# Patient Record
Sex: Female | Born: 1965 | Race: White | Hispanic: No | Marital: Married | Smoking: Current every day smoker
Health system: Southern US, Community
[De-identification: ages and names within clinical notes are randomized; demographics above are authoritative.]

## PROBLEM LIST (undated history)

## (undated) DIAGNOSIS — Z800 Family history of malignant neoplasm of digestive organs: Secondary | ICD-10-CM

## (undated) DIAGNOSIS — Z8052 Family history of malignant neoplasm of bladder: Secondary | ICD-10-CM

## (undated) DIAGNOSIS — R413 Other amnesia: Secondary | ICD-10-CM

## (undated) DIAGNOSIS — K449 Diaphragmatic hernia without obstruction or gangrene: Secondary | ICD-10-CM

## (undated) DIAGNOSIS — M549 Dorsalgia, unspecified: Secondary | ICD-10-CM

## (undated) DIAGNOSIS — E1140 Type 2 diabetes mellitus with diabetic neuropathy, unspecified: Secondary | ICD-10-CM

## (undated) DIAGNOSIS — N319 Neuromuscular dysfunction of bladder, unspecified: Secondary | ICD-10-CM

## (undated) DIAGNOSIS — E11610 Type 2 diabetes mellitus with diabetic neuropathic arthropathy: Secondary | ICD-10-CM

## (undated) DIAGNOSIS — N393 Stress incontinence (female) (male): Secondary | ICD-10-CM

## (undated) DIAGNOSIS — Z85038 Personal history of other malignant neoplasm of large intestine: Secondary | ICD-10-CM

## (undated) DIAGNOSIS — G2581 Restless legs syndrome: Secondary | ICD-10-CM

## (undated) DIAGNOSIS — Z794 Long term (current) use of insulin: Secondary | ICD-10-CM

## (undated) DIAGNOSIS — F319 Bipolar disorder, unspecified: Secondary | ICD-10-CM

## (undated) DIAGNOSIS — M1990 Unspecified osteoarthritis, unspecified site: Secondary | ICD-10-CM

## (undated) DIAGNOSIS — E1165 Type 2 diabetes mellitus with hyperglycemia: Secondary | ICD-10-CM

## (undated) DIAGNOSIS — Z8614 Personal history of Methicillin resistant Staphylococcus aureus infection: Secondary | ICD-10-CM

## (undated) DIAGNOSIS — G8929 Other chronic pain: Secondary | ICD-10-CM

## (undated) DIAGNOSIS — S62109A Fracture of unspecified carpal bone, unspecified wrist, initial encounter for closed fracture: Secondary | ICD-10-CM

## (undated) DIAGNOSIS — I739 Peripheral vascular disease, unspecified: Secondary | ICD-10-CM

## (undated) DIAGNOSIS — F32A Depression, unspecified: Secondary | ICD-10-CM

## (undated) DIAGNOSIS — G4700 Insomnia, unspecified: Secondary | ICD-10-CM

## (undated) DIAGNOSIS — L97509 Non-pressure chronic ulcer of other part of unspecified foot with unspecified severity: Secondary | ICD-10-CM

## (undated) DIAGNOSIS — I6930 Unspecified sequelae of cerebral infarction: Secondary | ICD-10-CM

## (undated) DIAGNOSIS — S02609A Fracture of mandible, unspecified, initial encounter for closed fracture: Secondary | ICD-10-CM

## (undated) DIAGNOSIS — C189 Malignant neoplasm of colon, unspecified: Secondary | ICD-10-CM

## (undated) DIAGNOSIS — Z8051 Family history of malignant neoplasm of kidney: Secondary | ICD-10-CM

## (undated) DIAGNOSIS — F329 Major depressive disorder, single episode, unspecified: Secondary | ICD-10-CM

## (undated) DIAGNOSIS — K219 Gastro-esophageal reflux disease without esophagitis: Secondary | ICD-10-CM

## (undated) DIAGNOSIS — R51 Headache: Secondary | ICD-10-CM

## (undated) DIAGNOSIS — F609 Personality disorder, unspecified: Secondary | ICD-10-CM

## (undated) DIAGNOSIS — F419 Anxiety disorder, unspecified: Secondary | ICD-10-CM

## (undated) DIAGNOSIS — I639 Cerebral infarction, unspecified: Secondary | ICD-10-CM

## (undated) DIAGNOSIS — I10 Essential (primary) hypertension: Secondary | ICD-10-CM

## (undated) DIAGNOSIS — R0602 Shortness of breath: Secondary | ICD-10-CM

## (undated) DIAGNOSIS — G4733 Obstructive sleep apnea (adult) (pediatric): Principal | ICD-10-CM

## (undated) DIAGNOSIS — E11621 Type 2 diabetes mellitus with foot ulcer: Secondary | ICD-10-CM

## (undated) DIAGNOSIS — IMO0002 Reserved for concepts with insufficient information to code with codable children: Secondary | ICD-10-CM

## (undated) DIAGNOSIS — E7800 Pure hypercholesterolemia, unspecified: Secondary | ICD-10-CM

## (undated) DIAGNOSIS — J449 Chronic obstructive pulmonary disease, unspecified: Secondary | ICD-10-CM

## (undated) DIAGNOSIS — R600 Localized edema: Secondary | ICD-10-CM

## (undated) HISTORY — PX: UPPER GI ENDOSCOPY: SHX6162

## (undated) HISTORY — PX: COLONOSCOPY: SHX174

## (undated) HISTORY — PX: KNEE ARTHROSCOPY W/ ALLOGRAFT IMPANT: SHX1862

## (undated) HISTORY — PX: WRIST FRACTURE SURGERY: SHX121

## (undated) HISTORY — PX: KNEE SURGERY: SHX244

## (undated) HISTORY — PX: HERNIA REPAIR: SHX51

## (undated) HISTORY — DX: Unspecified osteoarthritis, unspecified site: M19.90

## (undated) HISTORY — PX: MANDIBLE FRACTURE SURGERY: SHX706

## (undated) HISTORY — PX: LUMBAR FUSION: SHX111

## (undated) HISTORY — DX: Family history of malignant neoplasm of digestive organs: Z80.0

## (undated) HISTORY — PX: COLON SURGERY: SHX602

## (undated) HISTORY — PX: CARDIAC CATHETERIZATION: SHX172

## (undated) HISTORY — DX: Neuromuscular dysfunction of bladder, unspecified: N31.9

## (undated) HISTORY — DX: Pure hypercholesterolemia, unspecified: E78.00

## (undated) HISTORY — DX: Chronic obstructive pulmonary disease, unspecified: J44.9

## (undated) HISTORY — PX: FOOT SURGERY: SHX648

## (undated) HISTORY — DX: Family history of malignant neoplasm of bladder: Z80.52

## (undated) HISTORY — DX: Family history of malignant neoplasm of kidney: Z80.51

## (undated) HISTORY — DX: Obstructive sleep apnea (adult) (pediatric): G47.33

## (undated) HISTORY — PX: BACK SURGERY: SHX140

## (undated) HISTORY — PX: KNEE ARTHROSCOPY: SHX127

## (undated) HISTORY — DX: Malignant neoplasm of colon, unspecified: C18.9

---
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
## 1995-12-27 HISTORY — PX: TOTAL ABDOMINAL HYSTERECTOMY: SHX209

## 1998-06-18 ENCOUNTER — Other Ambulatory Visit: Admission: RE | Admit: 1998-06-18 | Discharge: 1998-06-18 | Payer: Self-pay | Admitting: Obstetrics and Gynecology

## 1998-12-26 HISTORY — PX: ANTERIOR CERVICAL DECOMP/DISCECTOMY FUSION: SHX1161

## 1999-04-22 ENCOUNTER — Inpatient Hospital Stay (HOSPITAL_COMMUNITY): Admission: AD | Admit: 1999-04-22 | Discharge: 1999-04-27 | Payer: Self-pay | Admitting: Psychiatry

## 1999-04-27 ENCOUNTER — Ambulatory Visit: Admission: RE | Admit: 1999-04-27 | Discharge: 1999-04-27 | Payer: Self-pay | Admitting: Neurosurgery

## 1999-04-27 ENCOUNTER — Encounter: Payer: Self-pay | Admitting: Neurosurgery

## 1999-05-04 ENCOUNTER — Encounter: Payer: Self-pay | Admitting: Neurosurgery

## 1999-05-06 ENCOUNTER — Inpatient Hospital Stay (HOSPITAL_COMMUNITY): Admission: RE | Admit: 1999-05-06 | Discharge: 1999-05-08 | Payer: Self-pay | Admitting: Neurosurgery

## 1999-09-18 ENCOUNTER — Emergency Department (HOSPITAL_COMMUNITY): Admission: EM | Admit: 1999-09-18 | Discharge: 1999-09-18 | Payer: Self-pay | Admitting: Emergency Medicine

## 1999-09-18 ENCOUNTER — Encounter: Payer: Self-pay | Admitting: Emergency Medicine

## 1999-11-15 ENCOUNTER — Encounter: Admission: RE | Admit: 1999-11-15 | Discharge: 1999-11-15 | Payer: Self-pay | Admitting: Hematology and Oncology

## 2000-01-05 ENCOUNTER — Encounter: Payer: Self-pay | Admitting: Neurosurgery

## 2000-01-05 ENCOUNTER — Encounter: Admission: RE | Admit: 2000-01-05 | Discharge: 2000-01-05 | Payer: Self-pay | Admitting: Neurosurgery

## 2000-01-12 ENCOUNTER — Ambulatory Visit (HOSPITAL_COMMUNITY): Admission: RE | Admit: 2000-01-12 | Discharge: 2000-01-12 | Payer: Self-pay | Admitting: Neurosurgery

## 2000-01-12 ENCOUNTER — Encounter: Payer: Self-pay | Admitting: Neurosurgery

## 2000-03-27 ENCOUNTER — Encounter: Payer: Self-pay | Admitting: Orthopedic Surgery

## 2000-03-27 ENCOUNTER — Encounter: Admission: RE | Admit: 2000-03-27 | Discharge: 2000-03-27 | Payer: Self-pay | Admitting: Orthopedic Surgery

## 2000-03-28 ENCOUNTER — Ambulatory Visit (HOSPITAL_BASED_OUTPATIENT_CLINIC_OR_DEPARTMENT_OTHER): Admission: RE | Admit: 2000-03-28 | Discharge: 2000-03-29 | Payer: Self-pay | Admitting: Orthopedic Surgery

## 2000-03-28 HISTORY — PX: OTHER SURGICAL HISTORY: SHX169

## 2000-04-25 ENCOUNTER — Encounter: Admission: RE | Admit: 2000-04-25 | Discharge: 2000-07-24 | Payer: Self-pay | Admitting: Orthopedic Surgery

## 2000-06-07 ENCOUNTER — Other Ambulatory Visit: Admission: RE | Admit: 2000-06-07 | Discharge: 2000-06-07 | Payer: Self-pay | Admitting: *Deleted

## 2000-06-13 ENCOUNTER — Encounter: Admission: RE | Admit: 2000-06-13 | Discharge: 2000-06-13 | Payer: Self-pay | Admitting: *Deleted

## 2000-06-13 ENCOUNTER — Encounter: Payer: Self-pay | Admitting: *Deleted

## 2000-12-27 ENCOUNTER — Encounter: Admission: RE | Admit: 2000-12-27 | Discharge: 2000-12-27 | Payer: Self-pay | Admitting: Neurosurgery

## 2000-12-27 ENCOUNTER — Encounter: Payer: Self-pay | Admitting: Neurosurgery

## 2001-01-22 ENCOUNTER — Ambulatory Visit (HOSPITAL_COMMUNITY): Admission: RE | Admit: 2001-01-22 | Discharge: 2001-01-22 | Payer: Self-pay

## 2001-02-08 ENCOUNTER — Encounter: Admission: RE | Admit: 2001-02-08 | Discharge: 2001-05-09 | Payer: Self-pay | Admitting: Anesthesiology

## 2001-02-14 ENCOUNTER — Encounter: Admission: RE | Admit: 2001-02-14 | Discharge: 2001-02-14 | Payer: Self-pay | Admitting: Obstetrics and Gynecology

## 2001-02-14 ENCOUNTER — Encounter: Payer: Self-pay | Admitting: Obstetrics and Gynecology

## 2001-04-19 ENCOUNTER — Ambulatory Visit (HOSPITAL_COMMUNITY): Admission: RE | Admit: 2001-04-19 | Discharge: 2001-04-19 | Payer: Self-pay

## 2001-04-30 ENCOUNTER — Encounter: Payer: Self-pay | Admitting: Emergency Medicine

## 2001-04-30 ENCOUNTER — Emergency Department (HOSPITAL_COMMUNITY): Admission: EM | Admit: 2001-04-30 | Discharge: 2001-04-30 | Payer: Self-pay | Admitting: Emergency Medicine

## 2001-05-16 ENCOUNTER — Encounter: Admission: RE | Admit: 2001-05-16 | Discharge: 2001-08-14 | Payer: Self-pay | Admitting: Anesthesiology

## 2001-06-16 ENCOUNTER — Emergency Department (HOSPITAL_COMMUNITY): Admission: EM | Admit: 2001-06-16 | Discharge: 2001-06-16 | Payer: Self-pay | Admitting: Internal Medicine

## 2001-08-13 ENCOUNTER — Encounter: Admission: RE | Admit: 2001-08-13 | Discharge: 2001-11-11 | Payer: Self-pay | Admitting: Anesthesiology

## 2001-08-22 ENCOUNTER — Encounter: Admission: RE | Admit: 2001-08-22 | Discharge: 2001-08-22 | Payer: Self-pay | Admitting: *Deleted

## 2001-08-22 ENCOUNTER — Encounter: Payer: Self-pay | Admitting: *Deleted

## 2001-08-30 ENCOUNTER — Ambulatory Visit (HOSPITAL_COMMUNITY): Admission: RE | Admit: 2001-08-30 | Discharge: 2001-08-30 | Payer: Self-pay | Admitting: *Deleted

## 2001-11-10 ENCOUNTER — Ambulatory Visit (HOSPITAL_COMMUNITY): Admission: RE | Admit: 2001-11-10 | Discharge: 2001-11-10 | Payer: Self-pay | Admitting: Neurosurgery

## 2001-11-10 ENCOUNTER — Encounter: Payer: Self-pay | Admitting: Neurosurgery

## 2002-08-28 ENCOUNTER — Other Ambulatory Visit: Admission: RE | Admit: 2002-08-28 | Discharge: 2002-08-28 | Payer: Self-pay | Admitting: Obstetrics and Gynecology

## 2003-04-30 ENCOUNTER — Emergency Department (HOSPITAL_COMMUNITY): Admission: EM | Admit: 2003-04-30 | Discharge: 2003-04-30 | Payer: Self-pay | Admitting: Emergency Medicine

## 2003-04-30 ENCOUNTER — Encounter: Payer: Self-pay | Admitting: Pediatrics

## 2004-02-11 ENCOUNTER — Ambulatory Visit (HOSPITAL_COMMUNITY): Admission: RE | Admit: 2004-02-11 | Discharge: 2004-02-11 | Payer: Self-pay | Admitting: Orthopedic Surgery

## 2004-03-25 ENCOUNTER — Ambulatory Visit (HOSPITAL_COMMUNITY): Admission: RE | Admit: 2004-03-25 | Discharge: 2004-03-25 | Payer: Self-pay | Admitting: Orthopedic Surgery

## 2004-03-25 ENCOUNTER — Ambulatory Visit (HOSPITAL_BASED_OUTPATIENT_CLINIC_OR_DEPARTMENT_OTHER): Admission: RE | Admit: 2004-03-25 | Discharge: 2004-03-25 | Payer: Self-pay | Admitting: Orthopedic Surgery

## 2004-04-27 ENCOUNTER — Encounter: Admission: RE | Admit: 2004-04-27 | Discharge: 2004-07-26 | Payer: Self-pay | Admitting: Orthopedic Surgery

## 2004-08-11 ENCOUNTER — Ambulatory Visit (HOSPITAL_COMMUNITY): Admission: RE | Admit: 2004-08-11 | Discharge: 2004-08-11 | Payer: Self-pay | Admitting: Internal Medicine

## 2004-08-16 ENCOUNTER — Encounter: Admission: RE | Admit: 2004-08-16 | Discharge: 2004-08-16 | Payer: Self-pay | Admitting: Internal Medicine

## 2004-08-28 ENCOUNTER — Ambulatory Visit (HOSPITAL_COMMUNITY): Admission: RE | Admit: 2004-08-28 | Discharge: 2004-08-28 | Payer: Self-pay | Admitting: Neurosurgery

## 2004-09-17 ENCOUNTER — Ambulatory Visit: Admission: RE | Admit: 2004-09-17 | Discharge: 2004-09-17 | Payer: Self-pay | Admitting: Neurosurgery

## 2004-10-01 ENCOUNTER — Inpatient Hospital Stay (HOSPITAL_COMMUNITY): Admission: RE | Admit: 2004-10-01 | Discharge: 2004-10-03 | Payer: Self-pay | Admitting: Neurosurgery

## 2005-01-04 ENCOUNTER — Emergency Department (HOSPITAL_COMMUNITY): Admission: EM | Admit: 2005-01-04 | Discharge: 2005-01-05 | Payer: Self-pay | Admitting: Family Medicine

## 2005-01-28 ENCOUNTER — Encounter: Admission: RE | Admit: 2005-01-28 | Discharge: 2005-01-28 | Payer: Self-pay | Admitting: Orthopedic Surgery

## 2005-02-17 ENCOUNTER — Ambulatory Visit (HOSPITAL_BASED_OUTPATIENT_CLINIC_OR_DEPARTMENT_OTHER): Admission: RE | Admit: 2005-02-17 | Discharge: 2005-02-17 | Payer: Self-pay | Admitting: Orthopedic Surgery

## 2005-02-17 ENCOUNTER — Ambulatory Visit (HOSPITAL_COMMUNITY): Admission: RE | Admit: 2005-02-17 | Discharge: 2005-02-17 | Payer: Self-pay | Admitting: Orthopedic Surgery

## 2005-05-09 ENCOUNTER — Encounter: Admission: RE | Admit: 2005-05-09 | Discharge: 2005-05-09 | Payer: Self-pay | Admitting: Internal Medicine

## 2005-05-09 ENCOUNTER — Emergency Department (HOSPITAL_COMMUNITY): Admission: EM | Admit: 2005-05-09 | Discharge: 2005-05-09 | Payer: Self-pay | Admitting: Emergency Medicine

## 2005-05-12 ENCOUNTER — Other Ambulatory Visit: Admission: RE | Admit: 2005-05-12 | Discharge: 2005-05-12 | Payer: Self-pay | Admitting: Obstetrics and Gynecology

## 2005-07-15 ENCOUNTER — Encounter: Admission: RE | Admit: 2005-07-15 | Discharge: 2005-07-15 | Payer: Self-pay | Admitting: Internal Medicine

## 2005-08-16 ENCOUNTER — Ambulatory Visit (HOSPITAL_COMMUNITY): Admission: RE | Admit: 2005-08-16 | Discharge: 2005-08-16 | Payer: Self-pay | Admitting: Internal Medicine

## 2005-09-25 ENCOUNTER — Emergency Department (HOSPITAL_COMMUNITY): Admission: EM | Admit: 2005-09-25 | Discharge: 2005-09-26 | Payer: Self-pay | Admitting: Emergency Medicine

## 2005-09-26 ENCOUNTER — Inpatient Hospital Stay (HOSPITAL_COMMUNITY): Admission: EM | Admit: 2005-09-26 | Discharge: 2005-09-30 | Payer: Self-pay | Admitting: Emergency Medicine

## 2005-11-09 ENCOUNTER — Emergency Department (HOSPITAL_COMMUNITY): Admission: EM | Admit: 2005-11-09 | Discharge: 2005-11-09 | Payer: Self-pay | Admitting: Emergency Medicine

## 2005-12-29 ENCOUNTER — Encounter: Admission: RE | Admit: 2005-12-29 | Discharge: 2005-12-29 | Payer: Self-pay | Admitting: Orthopedic Surgery

## 2006-01-12 ENCOUNTER — Emergency Department (HOSPITAL_COMMUNITY): Admission: EM | Admit: 2006-01-12 | Discharge: 2006-01-12 | Payer: Self-pay | Admitting: Family Medicine

## 2006-01-13 ENCOUNTER — Emergency Department (HOSPITAL_COMMUNITY): Admission: EM | Admit: 2006-01-13 | Discharge: 2006-01-13 | Payer: Self-pay | Admitting: Family Medicine

## 2006-04-26 ENCOUNTER — Encounter: Payer: Self-pay | Admitting: Neurosurgery

## 2006-04-26 ENCOUNTER — Ambulatory Visit: Payer: Self-pay | Admitting: Internal Medicine

## 2006-07-20 ENCOUNTER — Emergency Department (HOSPITAL_COMMUNITY): Admission: EM | Admit: 2006-07-20 | Discharge: 2006-07-20 | Payer: Self-pay | Admitting: Family Medicine

## 2006-07-22 ENCOUNTER — Emergency Department (HOSPITAL_COMMUNITY): Admission: EM | Admit: 2006-07-22 | Discharge: 2006-07-22 | Payer: Self-pay | Admitting: Emergency Medicine

## 2006-07-24 ENCOUNTER — Inpatient Hospital Stay (HOSPITAL_COMMUNITY): Admission: EM | Admit: 2006-07-24 | Discharge: 2006-07-28 | Payer: Self-pay | Admitting: Emergency Medicine

## 2006-09-12 ENCOUNTER — Ambulatory Visit (HOSPITAL_COMMUNITY): Admission: RE | Admit: 2006-09-12 | Discharge: 2006-09-12 | Payer: Self-pay | Admitting: Internal Medicine

## 2006-09-22 ENCOUNTER — Ambulatory Visit: Payer: Self-pay | Admitting: Internal Medicine

## 2006-10-16 ENCOUNTER — Ambulatory Visit (HOSPITAL_COMMUNITY): Admission: RE | Admit: 2006-10-16 | Discharge: 2006-10-16 | Payer: Self-pay | Admitting: *Deleted

## 2006-10-16 ENCOUNTER — Encounter: Admission: RE | Admit: 2006-10-16 | Discharge: 2007-01-14 | Payer: Self-pay | Admitting: Nurse Practitioner

## 2006-10-18 ENCOUNTER — Ambulatory Visit: Payer: Self-pay | Admitting: Internal Medicine

## 2006-11-01 ENCOUNTER — Ambulatory Visit: Payer: Self-pay | Admitting: Infectious Diseases

## 2006-11-01 LAB — CONVERTED CEMR LAB
AST: 25 units/L (ref 0–37)
Alkaline Phosphatase: 103 units/L (ref 39–117)
BUN: 10 mg/dL (ref 6–23)
Basophils percent auto: 1 % (ref 0–1)
Eosinophils Absolute: 0.2 cells/mcL (ref 0.0–0.3)
Eosinophils Relative: 1 % (ref 0–4)
Glucose, Bld: 368 mg/dL — ABNORMAL HIGH (ref 70–99)
HCT: 41.2 % (ref 34.4–43.3)
Leukocyte count, blood: 12.4 10*9/L — ABNORMAL HIGH (ref 3.7–10.0)
Lymphs Abs: 2.7 10*3/uL (ref 0.8–3.1)
MCHC: 35.2 g/dL (ref 33.1–35.4)
MCV: 88.8 fL (ref 78.8–100.0)
Monocytes Relative: 3 % (ref 3–11)
Neutrophils Relative %: 74 % (ref 47–77)
Platelets: 255 10*3/uL (ref 152–374)
Potassium: 4.3 meq/L (ref 3.5–5.3)
RBC: 4.64 M/uL (ref 3.79–4.96)
Total Bilirubin: 0.4 mg/dL (ref 0.3–1.2)

## 2006-12-06 ENCOUNTER — Ambulatory Visit: Payer: Self-pay | Admitting: Infectious Diseases

## 2008-02-11 ENCOUNTER — Emergency Department (HOSPITAL_COMMUNITY): Admission: EM | Admit: 2008-02-11 | Discharge: 2008-02-11 | Payer: Self-pay | Admitting: Emergency Medicine

## 2008-04-28 ENCOUNTER — Encounter: Admission: RE | Admit: 2008-04-28 | Discharge: 2008-04-28 | Payer: Self-pay | Admitting: Anesthesiology

## 2008-05-22 ENCOUNTER — Inpatient Hospital Stay (HOSPITAL_BASED_OUTPATIENT_CLINIC_OR_DEPARTMENT_OTHER): Admission: RE | Admit: 2008-05-22 | Discharge: 2008-05-22 | Payer: Self-pay | Admitting: Cardiology

## 2008-07-22 ENCOUNTER — Ambulatory Visit (HOSPITAL_COMMUNITY): Admission: RE | Admit: 2008-07-22 | Discharge: 2008-07-22 | Payer: Self-pay | Admitting: Obstetrics and Gynecology

## 2008-09-29 ENCOUNTER — Ambulatory Visit (HOSPITAL_COMMUNITY): Admission: RE | Admit: 2008-09-29 | Discharge: 2008-09-29 | Payer: Self-pay | Admitting: Gastroenterology

## 2010-02-08 ENCOUNTER — Ambulatory Visit (HOSPITAL_BASED_OUTPATIENT_CLINIC_OR_DEPARTMENT_OTHER): Admission: RE | Admit: 2010-02-08 | Discharge: 2010-02-08 | Payer: Self-pay | Admitting: Family Medicine

## 2010-02-21 ENCOUNTER — Ambulatory Visit: Payer: Self-pay | Admitting: Internal Medicine

## 2010-05-05 ENCOUNTER — Encounter: Admission: RE | Admit: 2010-05-05 | Discharge: 2010-07-05 | Payer: Self-pay | Admitting: Orthopedic Surgery

## 2010-11-05 ENCOUNTER — Encounter: Admission: RE | Admit: 2010-11-05 | Discharge: 2010-11-05 | Payer: Self-pay | Admitting: Family Medicine

## 2011-01-15 ENCOUNTER — Encounter: Payer: Self-pay | Admitting: Internal Medicine

## 2011-01-16 ENCOUNTER — Encounter: Payer: Self-pay | Admitting: Gastroenterology

## 2011-01-17 ENCOUNTER — Encounter: Payer: Self-pay | Admitting: Gastroenterology

## 2011-05-10 NOTE — Cardiovascular Report (Signed)
NAMECORINN, LARICCIA                ACCOUNT NO.:  [id number]   MEDICAL RECORD NO.:  [id number]          PATIENT TYPE:  OIB   LOCATION:  1965                         FACILITY:  [facility]   PHYSICIAN:  Jerline Pain, MD      DATE OF BIRTH:  31-Aug-1966   DATE OF PROCEDURE:  05/22/2008  DATE OF DISCHARGE:  05/22/2008                            CARDIAC CATHETERIZATION   PROCEDURES:  1. Left heart catheterization  2. Selective coronary angiography.  3. Left ventriculogram.   INDICATIONS:  Ms. Terzian is a 45 year old female with diabetes,  hypertension, hyperlipidemia, anxiety, tobacco abuse, early family  history of coronary artery disease with continued chest pain.  Nuclear  stress test was overall low risk, but did show a small area of subtle  decreased uptake in the inferior distal portion which may be consistent  with ischemia.  She returned to my office with ongoing chest discomfort  despite aggressive medical therapy.   PROCEDURE DETAILS:  Informed consent was obtained.  Risk of stroke,  heart attack, death, bleeding, renal impairment were discussed with the  patient at length.  She was placed on the catheterization table, prepped  in a sterile fashion.  1% lidocaine was used for local anesthesia on  right groin.  After visualization of the femoral head with fluoroscopy  using the modified Seldinger technique, a 4-French sheath was placed in  the right femoral artery.  Left Judkins catheter was then used to  selectively cannulate the left main artery.  Multiple views with hand  injection of Omnipaque were obtained.  This catheter was exchanged for a  no torque Williams-Wright catheter which was used to selectively  cannulate the right coronary artery.  Multiple views with Omnipaque were  obtained.  This catheter was changed for an angled pigtail which was  used to cross the aortic valve into the left ventricle.  Hemodynamics  were obtained.  In the RAO position, a power injection of 30  mL of  contrast was utilized for left ventriculogram.  Following this, aortic  valve pullback was obtained.  After the procedure, catheters were  removed, sheath was removed, manual compression held, the patient  tolerated the procedure well, findings were discussed with her and her  family.   FINDINGS:  1. Left main artery - branches into LAD and circumflex.  No      angiographically significant coronary artery disease.  2. Left anterior descending artery - there is a large diagonal branch      which parallels the LAD.  There is a smaller second diagonal branch      distally.  There is no angiographically significant coronary artery      disease.  3. Circumflex artery - there are two obtuse marginal branches and the      native circumflex continues in the AV groove.  There is no      angiographically significant coronary artery disease.  4. Right coronary artery - this is the dominant vessel giving rise to      the posterior descending artery.  There is no angiographically  significant coronary artery disease.  5. Left ventriculogram - normal left ventricular ejection fraction      estimated at 65% with no mitral regurgitation noted.  No wall      motion abnormalities.   HEMODYNAMICS:  Left ventricular pressure was [id number] systolic with an end-  diastolic pressure of 10 mmHg.  Aortic pressure was 100/58 with a mean  of 76 mmHg.  There was no gradient.   IMPRESSIONS:  1. No angiographically significant coronary artery disease.  2. Normal left ventricular ejection fraction of 65% with no wall      motion abnormalities.  3. No aortic valve gradient, normal left ventricular pressures.   PLAN:  Continue aggressive risk factor modification including smoking  cessation, diabetes control, statin for hyperlipidemia, and  antihypertensives.  We will see back in clinic in 2 weeks for  postcatheterization followup.      Jerline Pain, MD  Electronically Signed     MCS/MEDQ  D:   05/22/2008  T:  05/23/2008  Job:  [id number]   cc:   Dr. Reinaldo Meeker

## 2011-05-13 NOTE — Discharge Summary (Signed)
[name], [name]                ACCOUNT NO.:  [id number]   MEDICAL RECORD NO.:  [id number]          PATIENT TYPE:  INP   LOCATION:  1609                         [facility]ACILITY:  [facility]   PHYSICIAN:  Corinna L. Conley Canal, MDDATE OF BIRTH:  March 29, 1966   DATE OF ADMISSION:  07/24/2006  DATE OF DISCHARGE:  07/28/2006                                 DISCHARGE SUMMARY   DISCHARGE DIAGNOSES:  1.  Abscess of the right axilla status post incision and drainage x2,      culture growing methicillin-resistant staph aureus  2.  Uncontrolled diabetes.  3.  Hypertension.  4.  Tobacco abuse  5.  Gastroesophageal reflux disease.   DISCHARGE MEDICATIONS:  Doxycycline 100 mg p.o. b.i.d. until gone.  Metformin has been increased to 1000 mg p.o. b.i.d. Amaryl has been started  4 mg p.o. b.i.d.  She has been given a prescription for Chantex.  She may  continue atenolol, Lasix and Nexium. Spiriva, albuterol, Atacand  as  previous. She  may take Tylenol or Percocet  5/325 mg one to two p.o. q.4 h  p.r.n. pain, 20 were dispensed.   CONDITION:  Stable.   ACTIVITY:  Ad lib.   DIET:  Diabetic.   FOLLOW UP:  With primary care physician of choice in 1-2 weeks.  Follow up  with Dr. Deon Pilling in a week.  She is to call to make an appointment.   PROCEDURE:  Incision and drainage in the emergency department and repeat I &  D by Dr. Deon Pilling on July 26, 2006.   PERTINENT LABORATORIES:  The wound culture grew out methicillin-resistant  staph aureus which was sensitive to vancomycin and Bactrim, tetracycline,  rifampin, gentamicin and intermediate to levofloxacin. Hemoglobin A1c was  10.3.  BMET on admission was significant for potassium 3.4, glucose 298,  albumin of 3.0 otherwise unremarkable.  White count on admission was 13,000  with a normal differential.  At discharge her white count is 11,000.   HISTORY AND HOSPITAL COURSE:  [name] is  an unassigned 45 year old white  female with diabetes who has no primary  care physician, but occasionally  goes to the Urgent Care center, who presented to the emergency room with  right axillary infection.  She had been having pain for 3 weeks.  Apparently  she was given a prescription for ciprofloxacin by her gynecologist and then  the last week saw a physician at the [facility] and was given  Bactrim and was supposed to follow up. One of her associates incised the  abscess with a razor and drained pus. Her pain and swelling worsened and she  presented to the emergency room where  incision and drainage was done by the  ER physician.  She was started initially on Unasyn  and the culture grew out  methicillin-resistant staph aureus.  She was therefore switched to  vancomycin.  When I saw her she still had quite a bit of purulent drainage  and  fluctuance with obvious abscess that was not draining.  I consulted  surgery and a repeat I&D was done.  She received  local wound care.  Her  sugars were uncontrolled on admission and continued to be so.  She was given  sliding scale insulin and Lantus.  She was started on Amaryl and her  metformin has been increased. I suspect she may need further adjustment in  her diabetic medications but I am hesitant to send her home on insulin  without a  primary care physician.  Apparently she had some issues with  staff at her previous primary care physician's office, Dr. Mellody Drown.  I have  asked the social worker to give her a list of primary care physician who  still except Medicaid and have encouraged her to follow up as soon as  possible.  She also needs follow-up with Dr. Deon Pilling for wound check.  She  had her wounds packed twice a day with gauze but at this point known no  longer needs packing.  She has requested a prescription for Chantex to quit  smoking which I have written.   Total time on the day of discharge is 45 minutes.      Corinna L. Conley Canal, MD  Electronically Signed     CLS/MEDQ  D:  07/28/2006   T:  07/28/2006  Job:  [id number]

## 2011-05-13 NOTE — H&P (Signed)
[facility]  Patient:    [name], [name]                       MRN: [id number] Adm. Date:  [id number] Attending:  Nicholaus Bloom CC:         Raeford Razor. Elba Barman, M.D.   History and Physical  This is a followup evaluation.  HISTORY OF PRESENT ILLNESS:  [name] comes in followup evaluation of her chronic low back pain syndrome.  Since her last evaluation, she has done much better, but unfortunately she is down to one tablet of Topamax a day.  She rates her pain as a 5/10.  She is under a lot less stress.  She continues with her hydrochlorothiazide, Zenical, and she is taking the hydrocodone p.r.n.  She notes that her heating pad and laying down help quite a bit.  Activities tend to increase her discomfort.  PHYSICAL EXAMINATION:  VITAL SIGNS:  Blood pressure 123/75, heart rate 102, respiratory rate 18, O2 saturation 97%, pain level is 5/10.  NEUROLOGIC:  Straight leg raise signs are negative.  Deep tendon reflexes are symmetric.  Her spirits are positive today.  She feels much more fatigued than she has in the past, but cannot identify what could be causing this.  IMPRESSION: 1. Chronic low back pain on the basis of lumbar degenerative disc disease,    status post ray cage placement. 2. Other medical problems per Dr. Elba Barman.  DISPOSITION: 1. Continue on OxyContin 80 mg one p.o. b.i.d. #60 with no refill. 2. Resume Topamax at 100 mg b.i.d. 3. Hydrocodone 10/660 mg one p.o. q.6h. p.r.n. breakthrough pain #60 with no    refill. 4. Follow up with me in four weeks. 5. The patient was encouraged to follow up with Dr. Elba Barman with regard to her    fatigue.  She is asking about getting blood work today. DD:  07/16/01 TD:  07/16/01 Job: [id number]

## 2011-05-13 NOTE — Op Note (Signed)
[name], [name]                ACCOUNT NO.:  [id number]   MEDICAL RECORD NO.:  [id number]          PATIENT TYPE:  INP   LOCATION:  3012                         FACILITY:  [facility]   PHYSICIAN:  Marchia Meiers. Vertell Limber, M.D.  DATE OF BIRTH:  [birth year]   DATE OF PROCEDURE:  10/01/2004  DATE OF DISCHARGE:                                 OPERATIVE REPORT   PREOPERATIVE DIAGNOSES:  1.  Herniated cervical disc.  2.  Cervical spondylosis.  3.  Degenerative disc disease.  4.  Radiculopathy C5-6 with prior fusion at C6-7 level.   POSTOPERATIVE DIAGNOSES:  1.  Herniated cervical disc.  2.  Cervical spondylosis.  3.  Degenerative disc disease.  4.  Radiculopathy C5-6 with prior fusion at C6-7 level.   PROCEDURES:  1.  Exploration of fusion C6-7 with removal of hardware C6-7 level.  2.  Anterior cervical decompression and fusion C5-6 with allograft bone      graft and anterior cervical plate.   SURGEON:  Marchia Meiers. Vertell Limber, M.D.   ASSISTANT:  Ashok Pall, M.D.   ANESTHESIA:  General endotracheal anesthesia.   ESTIMATED BLOOD LOSS:  Minimal.   COMPLICATIONS:  None.   DISPOSITION:  Recovery.   INDICATIONS FOR PROCEDURE:  [name] is a 45 year old woman who  previously had undergone anterior cervical decompression and fusion at C6-7  level.  She developed severe neck pain and signs and symptoms consistent  with cervical myelopathy and was found to have a herniated disc at the C5-6  level.  She is a heavy smoker and while there did not appear to be any gross  instability on her cervical radiographs, it was elected to take her to  surgery and confirm that she indeed had a solid arthrodesis at C6-7 level  and to perform anterior decompression and fusion of the C5-6 level.   DESCRIPTION OF PROCEDURE:  [name] is brought to the operating room.  Following satisfactory and uncomplicated induction of general endotracheal  anesthesia and placement of intravenous lines, the patient is placed  in the  supine position on the operating table.  Her neck was placed in neutral  alignment and 10 pounds of Holter traction.  Her anterior neck was then  prepped and draped in the usual sterile fashion.  Area of plane incision was  infiltrated with 0.25% Marcaine, 0.5% lidocaine with 1:200,000 epinephrine.  Her previous incision was reopened from midline to the anterior border of  the sternocleidomastoid muscle and carried sharply through the platysmal  layer, subplatysma and dissection was performed and the anterior border of  the sternocleidomastoid muscle was identified.  Using blunt dissection, the  carotid sheath was kept lateral and trachea and esophagus kept medial.  Anterior cervical spine was exposed.  Blunt dissection was then utilized to  dissect through scar tissue overlying the previously placed Atlantis  anterior cervical plate.  The scar tissue was incised and removed.  Subsequently the locking screws were disengaged and the previous screws were  removed.  The plate was then elevated and removed.  The soft tissue  overlying the previously plated segment was  then carefully stripped from  bone and the interfaces of the bone edges were then carefully inspected.  There was no evidence of any pseudoarthrosis.  There appeared to be a full  union of the bone graft at both interfaces at C6 and C7 levels and there did  not appear to be any demonstrable instability at this level.  Subsequently,  the anterior osteophyte at C5-6 was then removed and by using Leksell  rongeur, a self-retaining Shadow Line retractor was placed to facilitate  exposure and the interspace was then incised with 15 blade and disc material  was removed in piecemeal fashion.  Using disc space spreaders, the end  plates were stripped of residual disc material using a variety of Carlens  curettes and pituitary rongeurs.  Disc space  spreader was placed and  microscope was brought into the field and using high  powered microscopic  visualization, the end plates of C5 and C6 were decorticated and the  morselized bone from the drilled end plates was then saved for use within  the bone graft placed at the end of the decompression.  There was a central  to right-sided disc herniation and disc material was removed.  The posterior  longitudinal ligament was then incised and removed in piecemeal fashion and  uncinate spurs were taken down with 2 and 3 mm gold tipped Kerrison  rongeurs.  The spinal cord dura was decompressed as were both C6 nerve roots  as they extended up the neural foramina.  Hemostasis was assured with  Gelfoam soaked in thrombin.  After using trial sizers, a 7 mm machine  cortical bone graft was selected packed with morselized bone autograft and  then tamped into position within the interspace.  An 18 mm Alphatek anterior  cervical plate was then affixed to the anterior cervical spine using 14 mm  variable angle screws, two at C5, two at C6,  all screws had excellent  purchase and locking mechanisms were engaged.  Prior to placing the plate,  the Holter traction was removed.  The wound was then irrigated and  inspected.  Soft tissues were found to be in good repair.  An attempt was  made at obtaining a postoperative radiograph but it was not possible to  visualize the bone graft in construct because of the patient's large body  habitus.  The platysma layer was then reapproximated with 3-0 Vicryl sutures  and the skin edges were reapproximated with interrupted 3-0 Vicryl  subcutaneous stitch.  The wound was dressed with Dermabond.  The patient was  placed in an Aspen collar, extubated in the operating room and taken to the  recovery room in stable and satisfactory condition, having tolerated  procedure well.  Counts were correct at the end of the case.      [name]   JDS/MEDQ  D:  10/01/2004  T:  10/01/2004  Job:  [id number]

## 2011-05-13 NOTE — Op Note (Signed)
NAMESKYLEY, [name]                ACCOUNT NO.:  [id number]   MEDICAL RECORD NO.:  [id number]          PATIENT TYPE:  AMB   LOCATION:  [place]                          FACILITY:  [facility]   PHYSICIAN:  Rodney A. Mortenson, M.D.DATE OF BIRTH:  18-Apr-1966   DATE OF PROCEDURE:  02/17/2005  DATE OF DISCHARGE:                                 OPERATIVE REPORT   PREOPERATIVE DIAGNOSIS:  Impingement syndrome versus partial rotator cuff  tear, left shoulder.   POSTOPERATIVE DIAGNOSES:  1.  Partial tear, rotator cuff, left shoulder.  2.  Torn anterior labrum.  3.  Impingement syndrome, left shoulder.   OPERATION:  1.  Arthroscopic debridement of anterior and anterior superior labrum.  2.  Arthroscopic debridement of articular portion of the rotator cuff,      supraspinatus tendon.  3.  Open anterior acromioplasty, left shoulder.   SURGEON:  Geroge Baseman. Alphonzo Cruise, M.D.   ANESTHESIA:  General.   PROCEDURE:  Patient placed on the operating table in a supine position.  After satisfactory general anesthesia, the patient was placed in semisitting  position.  The left upper extremity and shoulder were prepped with DuraPrep  and draped out in the usual manner.  An arthroscope was placed in the  posterior portal and through an anterior portal, an intra-articular shaver  was introduced.  Examination showed fairly normal articular cartilage over  the glenoid and the humeral head.  There was marked fraying and tearing of  the anterior labrum up to the superior attachment of the biceps.  The biceps  itself appeared fairly normal.  The undersurface of the supraspinatus was  markedly frayed.  Through the anterior portal the intra-articular shaver was  introduced.  The anterior labrum was debrided and the biceps anchor was  debrided.  Excellent decompression achieved at this level.  Above the biceps  there was fraying and tearing of the rotator cuff, and this was debrided  with the intra-articular shaver.   The arthroscope was then placed in the  subacromial space, but there was markedly enlarged bursa and it was  difficult to see, and the arthroscope was removed.   An anterior lateral incision made over the left shoulder.  Skin edges were  retracted.  Bleeders were coagulated.  The deltoid fibers were released off  the anterior aspect of the acromion only.  The subacromial space was  entered.  There was a very large redundant bursa, and this was completely  excised.  There was an anterior sloping of the acromion, and an  acromioplasty was done.  A very careful examination of the rotator cuff on  the subacromial side was done, and no obvious tears were seen.  An 18-gauge  needle was then inserted in the shoulder and 20 mL of fluid was injected and  there was no leakage, indicating only a partial tear of the rotator cuff,  which was debrided with the intra-articular shaver.  Throughout the  procedure the shoulder was irrigated with copious amounts of antibiotic  solution.  The deltoid fibers were reattached over the anterior aspect of  the acromion using 0 Vicryl.  Vicryl 2-0 was used to close the subcutaneous  tissue and a stainless steel staple was used to close the skin.  Sterile  dressings were applied.  The patient returned to the recovery room in  excellent condition.  Technically this procedure went extremely well.   FOLLOW-UP CARE:  1.  Demerol for pain.  2.  Sling.  3.  Return to my office on Wednesday.  4.  At that time start immediate physical therapy.      RAM/MEDQ  D:  02/17/2005  T:  02/17/2005  Job:  [id number]

## 2011-05-13 NOTE — Discharge Summary (Signed)
[name], [name]                ACCOUNT NO.:  [id number]   MEDICAL RECORD NO.:  [id number]          PATIENT TYPE:  INP   LOCATION:  [id number]                         FACILITY:  [facility]   PHYSICIAN:  Lenord Fellers, MD   DATE OF BIRTH:  1966/05/25   DATE OF ADMISSION:  09/26/2005  DATE OF DISCHARGE:  09/30/2005                                 DISCHARGE SUMMARY   PRIMARY CARE PHYSICIAN:  Dr. Dorrene German.   DISCHARGE DIAGNOSES:  1.  Facial cellulitis.  2.  Diabetes type 2.  3.  Hypertension.  4.  Gastroesophageal reflux disease.  5.  Tobacco abuse.   PROCEDURES:  Maxillofacial CT with contrast on October 4th demonstrated no  evidence of facial abscess or bony erosion.  Incidental note was made of an  impacted tooth fragment without surrounding inflammation.   For history and physical, please see dictated admission history and  physical.   LABORATORY DATA:  On admission white blood cell count was 18.1 which had  decreased to 10.3 on the morning of discharge.  Sed rate was elevated at 55.  Creatinine on discharge was 1.2.  TSH was 3.677, which is within normal  limits.   HOSPITAL COURSE:  [name] is a 45 year old female with a history of type  2 diabetes who presented to the [facility] emergency department with  complaints of facial swelling and pain.  This had started the day prior, at  which time she had presented to the emergency department and was sent home  on oral antibiotics, but it seemed to be progressing, so she returned.  She  was admitted for IV antibiotics and further evaluation.  She was started on  IV vancomycin and Zosyn, given her history of diabetes and concern for  multiorganism cellulitis.  On hospital day number two, she continued to  complain of a fair amount of facial pain and had a temperature of 101  overnight.  Therefore a maxillofacial CT with contrast was performed to  evaluate for any evidence of abscess.  This was negative.  From that time  on, she  continued to improve and remained afebrile for greater than 48 hours  prior to discharge.  On the morning of discharge, she was feeling well,  ready to go home, and facial pain, swelling and redness were nearly  resolved.  She will be discharged home today to complete an additional 7-day  course of Levaquin for facial cellulitis.   DISCHARGE MEDICATIONS:  1.  Levaquin 500 mg p.o. daily for 7 days.  2.  The patient is to resume all of her home medications which include      metformin 500 mg p.o. daily,  Atacand 32/12.5 mg, 1 tab p.o. daily,      Lasix 40 mg p.o. daily, Nexium 40 mg p.o. daily, K-Dur 40 mEq p.o.      daily, quinine sulfate 325 mg p.o. q.h.s. and folic acid Q000111Q mcg p.o.      daily.   DISCHARGE INSTRUCTIONS:  The patient is to follow up with her primary care  physician within 1 week.  She was instructed to return to the emergency  department should she develop recurrent fever or increasing facial pain or  swelling.           ______________________________  Lenord Fellers, MD     RAR/MEDQ  D:  09/30/2005  T:  09/30/2005  Job:  [id number]

## 2011-05-13 NOTE — Assessment & Plan Note (Signed)
[facility]                           GASTROENTEROLOGY OFFICE NOTE   NAME:Hynek, AMMARIE RUND                       MRN:          [id number]  DATE:10/18/2006                            DOB:          1966/08/17    CHIEF COMPLAINT:  Followup of diarrhea and regurgitation.   ASSESSMENT:  1. Symptoms compatible with gastroesophageal reflux disease and      regurgitation, question gastric pareses.  Her symptoms are under      control at this time.  2. 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  3. Noncompliance.  She did not show up for her gastric emptying study, and      she has not done her Hemoccult's.  4. On IV antibiotics for MRSA infection with peripherally inserted central      catheter line.   PLAN:  1. Since her regurgitation symptoms are gone, we will hold off on a      gastric emptying study.  2. Continue Nexium; this was refilled for 1 year, 40 mg daily.  3. Trial of Levbid for presumed irritable bowel syndrome.  This is E-      prescribed with 1 refill, number 60.  4. Return to me in 6 weeks.  5. I reminded her of the need to comply with my recommendations so we can      evaluate her.  She is to have Hemoccult's performed.  6. Keep in mind the possibility of treatment of bacterial overgrowth given      her chronic PPI use and diabetic state, however, I have held off on      that while she is on an IV antibiotic at this time, as that could be      confounder.       Gatha Mayer, MD,FACG      CEG/MedQ  DD:  10/18/2006  DT:  10/19/2006  Job #:  [id number]   cc:   [facility]

## 2011-05-13 NOTE — Assessment & Plan Note (Signed)
[facility]                           GASTROENTEROLOGY OFFICE NOTE   NAME:[name], [name] SCHWITZER                       MRN:          [id number]  DATE:09/22/2006                            DOB:          02/15/1966    REFERRING PHYSICIAN:  Lajuana Ripple   PROBLEM:  1. Chronic reflux.  2. Chronic diarrhea.   HISTORY:  [name] is a 45 year old white female with history of adult onset  diabetes mellitus, now insulin dependant over the past two years. She has  also a history of hypertension, obesity, chronic anxiety, hyperlipidemia,  chronic obstructive pulmonary disease, and is status post hysterectomy. She  has been seen remotely by Dr. Sharlett Iles, 20-some years ago, and had  undergone upper endoscopy. She had also been in the 1990's by Dr. Delfin Edis, and at that time felt to have IBS. Her prior  esophagogastroduodenoscopy in 1985 had been normal.   Patient says that she has had chronic problems with acid reflux over the  past 20 some years and is currently maintained on Nexium 40 mg daily, and  she says that this controls her heartburn and indigestion fairly well,  however, she continues to have problems with regurgitation. She does not  generally have any nausea or vomiting, but says that occasionally she has  some very foul burps and gas. She is not having a lot of problems with early  satiety, bloating, etc.   Patient also relates that she has had problems with loose stools since third  grade. She says that if she eats dairy it tends to constipate her, otherwise  she usually has 3-4 bowel movements per day and at times will have urgency  and rather explosive diarrhea, especially after eating out. She has not  noted any melena or hematochezia. Her weight has been stable and she has  actually gained weight in the past six months and seems distressed about her  weight gain. She says that my symptoms do not correlate with my weight and  she does not  understand why she is not losing weight if she is having  problems with diarrhea. She is not using much caffeine, and usually drinks  tea, three or four times per week. No regular coffee intake.   Patient also relates that,I do not take very good of myself.  I had also  asked about her glucose control and she says, I do not know, I do not  check, and she admits that she is not very good about taking her  medications either.   CURRENT MEDICATIONS:  1. Metformin 1 daily. I do not have a dosage.  2. Atacand 1 p.o. daily.  3. Tenormin 1 p.o. daily.  4. Lasix daily.  5. Nexium 40 daily.  Patient did not know the dosages of any her medications.   ALLERGIES:  IBUPROFEN AND TRAZODONE   PAST MEDICAL HISTORY:  As outlined above. She has had lumbar spine surgery  as well as cervical spine surgery, and six knee repairs. She had a recent  hospitalization for right axillary abscess which grew a methicillin  resistant Staphylococcus aureus  and still has a PICC line in and has been  receiving antibiotics.   FAMILY HISTORY:  Negative for colon cancer or polyps. She has a positive  family history of prostate cancer in her father. Lymphoma and breast cancer  in her mother. Heart disease and multiple relatives with diabetes.   SOCIAL HISTORY:  Patient is divorced and lives alone. She has a sixth grade  education. Not currently employed. She is a smoker, no regular ETOH.   REVIEW OF SYSTEMS:  Pertinent for lower extremity edema, cough, arthritic  symptoms, excessive thirst, occasional night-sweats, palpitations, lower  back pain, chronic fatigue, and dyspnea with exertion. Gastrointestinal is  as above.   PHYSICAL EXAMINATION:  GENERAL: Well-developed white female in no acute  distress.  VITAL SIGNS: Height 5'5, weight 232, blood pressure 132/84, pulse 60.  HEENT: Non traumatic normal cephalic.  EOMI.  PERRLA.  Sclera anicteric.  CARDIOVASCULAR: Regular rate and rhythm with S1 and S2.  Pulmonary  cleared to A & P.  ABDOMEN: Soft, bowel sounds are active. She is minimally tender in the right  mid-quadrant. There is no guarding or rebound, no mass or  hepatosplenomegaly.  RECTAL: Exam was not done today.   IMPRESSION:  30. 45 year old female diabetic with chronic gastroesophageal reflux      disease, primarily manifested with regurgitation currently, rule out      component of gastroparesis; gas and a foul eructation, rule out      bacterial overgrowth.  2. Chronic diarrhea for many, many years. I suspect this is irritable      bowel syndrome with no alarm symptoms whatsoever.  3. Non-compliance.  4. Other diagnoses as listed.   PLAN:  1. Continue Nexium 40 mg p.o. daily. She has a prescription and refills.  2. Check gastric emptying scan.  3. Will hold on any new medications until results of gastric emptying scan      reviewed. Depending on those results, I may also consider giving a      trial of antibiotic therapy for possible bacterial overgrowth.  4. Check Hemoccult.  5. Patient is to have a return office visit in one month.      ______________________________  Nicoletta Ba, PA-C    ______________________________  Gatha Mayer, MD,FACG     AE/MedQ  DD:  09/22/2006  DT:  09/25/2006  Job #:  [id number]   cc:   Lajuana Ripple

## 2011-05-13 NOTE — H&P (Signed)
[facility]  Patient:    [name], [name]                       MRN: [id number] Adm. Date:  [id number] Attending:  Nicholaus Bloom CC:         Marchia Meiers. Vertell Limber, M.D.  [place]. Elba Barman, M.D.   History and Physical  FOLLOWUP EVALUATION:  [name] comes in for followup evaluation of her low back pain.  Since her last evaluation, she has been through a lot of stress; she has parted with her boyfriend of three years, her mother has been ill, she has got an ill dog, she has had concerns over her mothers health, and she presents today for followup evaluation.  She does feel as though the Topamax is helping but feels like she can tolerate a higher dose and wants to take it twice a day rather than four times a day.  She continues on the OxyContin and states that this is doing well overall.  She seems to be holding things fairly well together despite all of her problems.  She rates her pain at 6/10.  PHYSICAL EXAMINATION:  VITAL SIGNS:  Blood pressure 134/88.  Heart rate is 99.  Respiratory rate is 18.  O2 saturation is 98%.  Pain level is 6/10.  EXTREMITIES:  She has hypoactive but symmetric deep tendon reflexes at the knees and ankles.  BACK:  Straight leg raise signs were negative.  IMPRESSION: 1. Chronic low back pain syndrome with underlying lumbar degenerative disk    disease, status post Ray cage placement. 2. Other medical problems per Dr. Patrick Jupiter C. Elba Barman.  DISPOSITION: 1. Continue on OxyContin 80 mg 1 p.o. b.i.d., #60 with no refill. 2. Increase Topamax to 100 mg 1 p.o. b.i.d., #60 with 3 refills. 3. Hydrocodone 10/660 mg one p.o. q.6h. p.r.n. breakthrough pain, #60 with no    refill. 4. Follow up with me in eight weeks. 5. I went ahead and gave her a prescription for hydrochlorothiazide, she is    running out of this, and she was reluctant to give Dr. Elba Barman a call.  I    advised her that she needs to keep in contact with him, since he is her    primary care  physician. DD:  06/18/01 TD:  06/19/01 Job: [id number]
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
## 2011-05-13 NOTE — Op Note (Signed)
NAME:  [name], [name]                          ACCOUNT NO.:  [id number]   MEDICAL RECORD NO.:  [id number]                   PATIENT TYPE:  AMB   LOCATION:  [place]                                  FACILITY:  [facility]   PHYSICIAN:  Rodney A. Alphonzo Cruise, M.D.           DATE OF BIRTH:  12/19/1966   DATE OF PROCEDURE:  03/25/2004  DATE OF DISCHARGE:                                 OPERATIVE REPORT   PREOPERATIVE DIAGNOSIS:  Ruptured anterior cruciate ligament reconstruction.   POSTOPERATIVE DIAGNOSIS:  Ruptured anterior cruciate ligament  reconstruction.   OPERATION:  Third revision anterior cruciate ligament, left knee, using  allograft.   SURGEON:  Geroge Baseman. Alphonzo Cruise, M.D.   Terrence DupontCarlis Abbott.   ANESTHESIA:  General.   PROCEDURE:  Patient placed on the operating table in supine position and a  pneumatic tourniquet about the left upper thigh.  The left leg was placed in  a leg holder.  The left lower extremity was prepped with Duraprep and draped  in the usual manner.  The leg was then wrapped out with an Esmarch and the  tourniquet was elevated.  A superior medial portal was made and an infusion  cannula was inserted.  Anteromedial, anterolateral, and a midline patellar  portal were made.  Arthroscope was introduced.  Very careful examination of  the knee was taken.  There was some early chondromalacia of the patella, but  the femoral notch and the trochlear area of the femur looked fairly normal.  In the lateral compartment there was normal articular cartilage over the  lateral femoral condyle and lateral tibial plateau, and the entire  circumference of the lateral meniscus was normal.  The arthroscope was then  placed into the intercondylar notch area, and there was complete disruption  of the reconstructed ACL.  The patient has had two previous ACL  reconstructions.  In the medial compartment the patient had a previous  medial meniscectomy but there were no new tears.  There was  some thinning of  articular cartilage over the femoral condyle and tibial plateau but no frank  arthritic areas are noted.   The intra-articular shaver was introduced and the stump of the ACL was  debrided.  The interference screw was found in a notch of the femur and this  was taken out with a fair amount of difficulty.  Small osteotomes were used  to loosen up the screw around the margins.  A series of graspers were  inserted and finally the interference screw was removed through the  anteromedial portal.  Concurrently on the back table an allograft was being  prepared and measured.  Once the allograft was prepared, Fibrewire sutures  placed through both bony plugs.  This was sized for a size 10.  This was  placed in wet sponges and placed on the back table.   Attention then turned back to the knee.  The tibial guide was then  inserted  and placed against the posterior cruciate ligament, which was still intact  and functioning.  Using the external guide, a guidewire was passed up  through the tibia to the footprint of the ACL.  A 10 mm cannulated drill was  then used and a drill hole was placed through the tibia.  The intra-  articular shaver was introduced and all debris was removed.  With the knee  flexed 90 degrees, the femoral guide was placed through the hole in the  tibia and over the posterolateral corner of the lateral femoral condyle.  A  Beath needle guide was passed through the femoral guide and out the anterior  aspect of the thigh.  Using the cannulated drill, this was passed over the  guide pin and a hole was placed in the femur that measured about 30 mm in  depth.  This was checked with the arthroscope and a nice bony tunnel was  achieved.  The knee was irrigated and debrided.  This actually was a fairly  difficult operative procedure.  Graft was taken from the back table, sutures  were placed through the eye of the Beath needle, and the needle was pulled  through the  knee, pulling the bony plug up into the femur.  This seated very  nicely.  A flexible guide was passed over the bony plug and a 7 x 25 mm  interference screw with a protective sheath was inserted over the guide and  the interference screw was inserted.  Excellent compression and capture of  the bone plug was achieved.  Distally an interference screw would not fit.  A bony trough was made so the proximal bone plug was replaced in the bony  trough and a very large staple was used.  Tension was placed on the graft  before the staple was inserted, and the staple was inserted and this  captured the bony plug in the tibia very nicely.  The reconstructed ACL was  visualized and a probe was used.  There was no impingement in full  extension.  Palpating with a probe showed the graft to be very tight and in  good position.  Technically this ended up being a very excellent  reconstruction but again, this was an extremely complicated, difficult  operative procedure and took quite a bit of extended time.  The tourniquet  was dropped.  The subcutaneous tissue was closed with 2-0 Vicryl, stainless  steel staples used to close the skin.  Sterile dressings were applied and  the patient returned to the recovery room in excellent condition.  Technically this went very well.   DRAINS:  None.   COMPLICATIONS:  None.                                               Rodney A. Alphonzo Cruise, M.D.    RAM/MEDQ  D:  03/25/2004  T:  03/25/2004  Job:  [id number]

## 2011-05-13 NOTE — Op Note (Signed)
[place]  Patient:    [name], [name]                         MRN: [id number] Proc. Date: 03/28/00 Adm. Date:  [id number] Attending:  Pryor Curia                           Operative Report  PREOPERATIVE DIAGNOSIS:  Old repair, anterior cruciate ligament, left knee, with new rupture.  POSTOPERATIVE DIAGNOSES:  Old repair, anterior cruciate ligament, left knee, with new rupture.  OPERATION:  Repeat reconstruction of anterior cruciate ligament using cadaver graft; removal of bone screws from femur and tibia, left knee.  SURGEON:  Geroge Baseman. Alphonzo Cruise, M.D.  ASSISTANT:  Evert Kohl, P.A.  ANESTHESIA:  General.  PATHOLOGY:  The arthroscope was inserted in the knee; both medial and lateral compartments were visualized.  Patient had a previous medial meniscectomy by Dr. Vonna Kotyk. [place].  No new tears on this side were seen.  The articular cartilage over the medial femoral condyle and medial tibial plateau was normal.  The arthroscope was then passed into the lateral compartment and the articular cartilage over the lateral femoral condyle and lateral tibial plateau was normal. No tears of the lateral meniscus.  The arthroscope was then placed in the intercondylar notch.  The ACL was still present but totally nonfunctional. This could be moved at least a centimeter medial- and lateral-wards with a probe. It did not stabilize the knee to AP drawer.  DESCRIPTION OF PROCEDURE:  Patient was placed on the operating table in the supine position, with a pneumatic tourniquet about the left upper thigh.  Left leg was  placed in a leg holder.  The entire left lower extremity was prepped with Duraprep and draped out in the usual manner.  An infusion cannula was placed in the superomedial pouch and the knee distended with saline.  Anteromedial and anterolateral portals were made.  A midline incision over the lower half of the  previous  incision was made.  Dissection carried down to the tibia.  With dissection, the tibial fixation screw could be found; this was removed with a screwdriver.  At this point, the arthroscope was placed in the knee; the findings were as described above.  Using the shaver, the soft tissue off over the lateral side of the femoral notch and where the initial ACL was fixed was debrided.  A nonfunctioning ACL was totally removed with the intra-articular shaver and a basket forceps.  A new notchplasty was done and with a fair amount of dissection, the previous fixation screw in the femur was then found; this was then removed arthroscopically.  At this point, attention was turned to the cadaver graft that was on the back table.  This was measured out to 105 mm in length and the bony plugs at either end were fashioned to fit a 10-mm hole in either end.  Drill holes were placed through the bony plugs and heavy Tycron sutures were inserted. This was placed on the back table in sterile saline.  Attention was then turned back to the leg itself.  The tibial guide was inserted and placed in a proper position. A drill was then passed up through the drill guide and it came out to be in almost anatomic position.  A 10-mm cannulated reamer was then used to drill over the guidewire.  Excellent fixation and positioning  of the hole was achieved.  The intra-articular shavers were used to remove debris.  In a similar manner, the femoral guide was passed up through the hole in the tibia and placed over the posterolateral femoral condyle, over the lateral notch.  A long Beath needle was then passed through the femoral guide and brought out the anterior aspect of the thigh.  A 10-mm reamer was then selected and passed over the guidepin and a 25-mm depth hole was placed in the femur.  The bone plug for the femur measured 20 mm. All debris was removed from the knee.  The sutures in the graft were  passed through the eye of the needle and the needle was pulled through the knee, pulling the femoral plug up into the femoral trough; this seated very nicely.  Tension could be placed on the graft in the tibial hole and excellent realignment of the graft was achieved.  In full flexion and extension, there was no impingement.  There was ice tension on the graft.  This was palpated with a probe and seated very nicely. t this point, an interference guide was passed into the knee over the bony plug in the femur, then the interference screw with a sheath, 7 mm in diameter and 20 mm long, was then passed up into the femur and this locked the bony plug very securely.  Distally, the interference screw was not felt to be adequate fixation and a trough was made in the metaphyseal portion of the tibia.  Attachment was placed on the graft and a heavy bone staple was used to stabilize the distal bony plug and this gave excellent fixation of the distal part of the graft.  Again, he graft was visualized and palpated with a probe and it was extremely tight in full flexion and full extension.  The subcutaneous tissue was then closed with 2-0 Vicryl and stainless steel staples were used to close the skin, sterile dressings were applied and the patient returned to the recovery room in excellent condition. Technically, this revision went extremely well.DD:  03/28/00 TD:  03/29/00 Job: [id number]

## 2011-05-13 NOTE — Consult Note (Signed)
NAMETEMPRENCE, BODY                ACCOUNT NO.:  [id number]   MEDICAL RECORD NO.:  [id number]          PATIENT TYPE:  INP   LOCATION:  1609                         FACILITY:  [facility]   PHYSICIAN:  Georgina Quint, M.D.   DATE OF BIRTH:  1966/01/07   DATE OF CONSULTATION:  07/26/2006  DATE OF DISCHARGE:                                   CONSULTATION   CHIEF COMPLAINT:  Pain in the right axilla.   PRESENT ILLNESS:  The patient is a 45 year old white female who was in the  hospital for cellulitis and abscess in the right axilla.  The patient had  had I&D in the emergency room and had had an attempt with I&D with a sharp  razor prior to that at home.  She had had some fever and continued pain.  I  was asked to see the patient because of concern that the infection was not  adequately drained.  The patient has no history of previous similar problems  and is not prone to infections.  She does have type 2 diabetes,  hypertension, hyperlipidemia.  She has not been on antibiotics prior to  this.  The culture had grown MRSA.   PHYSICAL EXAMINATION:  The patient is obese.  Vital signs were unremarkable.  Temperature is normal  now.  There is some redness around an I&D site in the right axilla.  There  is palpable induration and tenderness and a little redness at two separate  sites lower in the axilla.  There was is no similar problem in the groin or  in the and left axilla.  The breasts had no cellulitis.  I saw no lesions on  the extremities.  There was no heart murmur.   IMPRESSION:  Inadequately drained infection, right axilla.   PLAN AND PROCEDURE:  After the patient gave permission, I thoroughly  anesthetize the area around the existing I&D and around the two areas of  obvious infection adjacent to it.  I removed the packing from the existing  drainage site.  I opened that area a little further because it was quite a  large cavity and that would promote the ability for better packing.  I  incised and drained two additional abscesses using a #11 blade and dilating  the tract with a hemostat.  I got pus from each one and I packed each cavity  with a small amount of gauze.  I applied a bulky compressive bandage.  She  tolerated it well.  I will see the patient tomorrow and order wound care.      Georgina Quint, M.D.  Electronically Signed     WB/MEDQ  D:  07/26/2006  T:  07/26/2006  Job:  [id number]

## 2011-05-13 NOTE — H&P (Signed)
[facility]  Patient:    [name], [name] Visit Number: [id number] MRN: [id number]          Service Type: PMG Location: [facility] Attending Physician:  Nicholaus Bloom Adm. Date:  [id number]   CC:         Wayne C. Elba Barman, M.D.   History and Physical  FOLLOWUP EVALUATION:  The patient comes in for followup evaluation of her chronic low back pain syndrome on the basis of degenerative disk disease, status post Ray cage placement with ongoing pain.  Since her last evaluation, she has resumed the Topamax but takes it inconsistently at 100 mg twice a day. She continues on the OxyContin, hydrocodone, hydrochlorothiazide and Xenical much as previously.  Her back discomfort comes and goes and she is under a fairly stable level of discomfort and seems in better spirits overall.  She has had recurrent problems with right facial and right upper extremity numbness and tingling which come and go.  She has a history of a heart arrhythmia but has not followed up with Dr. Patrick Jupiter C. Wu with regard to these symptoms and I strongly encouraged her to think about this.  She has no new neurologic symptoms in her lower extremities.  EXAMINATION:  VITAL SIGNS:  Blood pressure 149/86.  Heart rate is 102.  Respiratory rate is 16.  O2 saturation is 99%.  Pain level is 5/10.  NEUROLOGIC:  Straight leg raise signs were negative.  Deep tendon reflexes symmetric in the lower extremities.  Motor is 5/5.  Upper extremities demonstrated negative Tinels signs with symmetric deep tendon reflexes. Radial pulses and dorsalis pedis pulses were 1 to 2+ and symmetric.  IMPRESSION: 1. Chronic low back pain on the basis of lumbar degenerative disk disease,    status post Ray cage placement. 2. Other medical problems per Dr. Elba Barman.  DISPOSITION: 1. Continue on OxyContin 80 mg 1 p.o. b.i.d., #60 with no refill. 2. Topamax 100 mg 1 p.o. b.i.d., #60 with 3 refills. 3. Hydrocodone 10/660 mg 1 p.o. q.6h.  p.r.n., #60 with no refill. 4. Follow up with me in four to eight weeks. 5. Patient was strongly encouraged to go ahead and followup with Dr. Elba Barman with    regard to her right hemifacial weakness that comes and goes. DD:  08/13/01 TD:  08/13/01 Job: [id number]

## 2011-05-13 NOTE — H&P (Signed)
[name], [name]                ACCOUNT NO.:  [id number]   MEDICAL RECORD NO.:  [id number]          PATIENT TYPE:  EMS   LOCATION:  ED                           FACILITY:  [facility]   PHYSICIAN:  Barbette Merino, M.D.      DATE OF BIRTH:  1966-04-18   DATE OF ADMISSION:  09/26/2005  DATE OF DISCHARGE:                                HISTORY & PHYSICAL   PRIMARY CARE PHYSICIAN:  Dr. Jilda [name].   PRESENTING COMPLAINT:  Facial swelling and pain.   HISTORY OF PRESENT ILLNESS:  The patient is a 45 year old  white female with  a history of diabetes type 2 diagnosed about a year ago and also  hypertension and tobacco abuse, who presented with increasing pain in her  face and swelling involving her right nostril.  The patient was seen in the  emergency room yesterday and diagnosed with facial cellulitis.  She was sent  home on oral antibiotics, but she came back today due to worsening symptoms,  associated with fever and some nausea.  The patient described her pain as  very severe and excruciating.  She has also had some chills.   PAST MEDICAL HISTORY:  Diabetes type 2, hypertension, tobacco use, obesity,  GERD, multiple joint surgeries.   ALLERGIES:  The patient is allergic to trazodone.   SOCIAL HISTORY:  The patient is single and has a child here in [place].  She smokes about four packs of cigarettes per day.  She denied any alcohol  use or IV drug use.   FAMILY HISTORY:  Mom just got diagnosed with breast cancer.  Also a history  of diabetes and hypertension.  Mother is also a diabetic.   REVIEW OF SYSTEMS:  Mainly generalized weakness and tiredness.  The rest is  per HPI.   PHYSICAL EXAMINATION:  VITAL SIGNS:  Temperature 99.4, blood pressure  156/101, pulse 100, respiratory rate 20, saturations 99% on room air.  GENERAL:  She is alert, oriented and weak.  She seems to be in mild  distress.  HEENT:  PERRL.  EOMI.  NECK:  Supple.  No JVD, no lymphadenopathy.  RESPIRATORY:  She  has good air entry bilaterally.  No wheezes or rales.  CARDIOVASCULAR:  She is tachycardic.  ABDOMEN:  Obese and nontender with positive bowel sounds.  EXTREMITIES:  Show no edema, cyanosis or clubbing.  SKIN:  On skin exam, mainly the facial skin, showed an area of redness in  the central part of the face, swelling and tender to touch over the bony  surface on the skin surface.   LABORATORY:  White count 15.2, hemoglobin 11.8 with a platelet count of 228.  The patient has a left shift, ANC of 12.  Her MCV is 92.  Sodium 138,  potassium 3.3, chloride 103, CO2 26, glucose 143, BUN 6, creatinine 0.8,  calcium 8.9, total protein 6.5, albumin 3.3, AST 18, ALT 17, alkaline  phosphatase 55 and total bilirubin 0.5.   ASSESSMENT:  1.  This is a 45 year old female presenting with what appears to be facial  cellulitis.  Per history the patient was in [place] recently with      her daughter who has scoliosis and had surgery.  Her daughter has some      infection with staph.  The patient may have been exposed to community-      acquired staph, but we also need to worry about other infections, being      a diabetic.  For this reason, I will start the patient on both      vancomycin and Zosyn for other presumed infection while getting      cultures.  I will put her in some contact isolation once we get any      report back indicating MRSA.  As indicated, being a diabetic this could      be a diabetic infection and she could be polymicrobial.  Hence I am      adding the Zosyn empirically.  If there is no improvement, I will scan      her face.  2.  Hypertension.  I will continue with the patient's home medications which      include Atacand HCT and lax.  3.  Tobacco abuse.  I have counseled the patient and will get specific      counseling for the patient.  4.  Gastroesophageal reflux disease.  I will continue with her home dose of      Nexium.  Further management will depend on the initial  results.      Barbette Merino, M.D.  Electronically Signed     LG/MEDQ  D:  09/26/2005  T:  09/26/2005  Job:  [id number]
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
## 2011-05-13 NOTE — H&P (Signed)
[facility]  Patient:    [name], [name]                       MRN: [id number] Adm. Date:  [id number] Attending:  Nicholaus Bloom CC:         Dr. Elba Barman   History and Physical  [name] comes in for followup evaluation of her chronic low back pain syndrome and more recently neck discomfort.  Since her last evaluation she has noted that her lower back discomfort has improved somewhat on the OxyContin.  She still has some breakthrough days, but in general she feels much happier.  She has developed some intermittent neck discomfort which will radiate out into the right shoulder region which is made worse by turning her head from side to side.  The radicular pain through her lower extremities is unchanged.  She has recently seen Dr. Elba Barman who started her on Xenical.  She has also seen Dr. Blenda Mounts who feels as though she has some entrapment of her nerves in her lower extremities.  CURRENT MEDICATIONS: 1. OxyContin 40 mg t.i.d. 2. Hydrochlorothiazide 25 mg. 3. Xenical 120 mg.  She has stopped taking her amitriptyline because she felt as though it was not helping her to any extent.  She is interested in trying the Topamax.  PHYSICAL EXAMINATION  VITAL SIGNS:  Blood pressure 175/101, heart rate 107, respiratory rate 14, O2 saturation 98%, pain level 5/10.  EXTREMITIES:  Deep tendon reflexes in the upper extremities were symmetric as well as in the lower extremities.  Motor 5/5.  IMPRESSION: 1. Chronic low back pain on the basis of myofascial pain with element of    chronic discomfort from her degenerative disk disease. 2. Neck discomfort predominantly myofascial with underlying degenerative disk    disease. 3. Other medical problems per Dr. Elba Barman.  DISPOSITION: 1. Discontinue OxyContin as it is not covered by medicaid until it fulfills    her criteria and put her on MS Contin 60 mg one p.o. b.i.d. #60. 2. MS IR 15 mg one p.o. q.6h. p.r.n. breakthrough pain  #60. 3. Restart Elavil 10 mg two p.o. q.p.m. 4. Follow-up with me in four weeks. 5. I advised the patient I would hold off on starting her on Topamax until we    see how she responds to the MS Contin. DD:  03/09/01 TD:  03/09/01 Job: [id number]

## 2011-05-13 NOTE — H&P (Signed)
[facility]  Patient:    [name], [name]                       MRN: [id number] Adm. Date:  [id number] Attending:  Nicholaus Bloom CC:         Raeford Razor. Elba Barman, M.D.  Marchia Meiers. Vertell Limber, M.D.   History and Physical  FOLLOW-UP EVALUATION:  Camiryn comes in for follow-up evaluation of her chronic low back pain syndrome, status post fusion for lumbar degenerative disk disease.  Since her last evaluation, she has had much less neck discomfort and seems to be tolerating the 80 mg of OxyContin twice a day well.  She takes Tylenol for breakthrough pain.  Since her last visit, she has developed increasing foot discomfort, leg cramps in the thighs, and she she describes as more intense pain radiating out into the legs from previously.  She says it is different in character, but she finds it very difficult to describe the differences from previously.  She is very concerned that there has been a change in her back since her last visit.  She is having more pain shooting out to her legs and spasms and pain radiating out into the left lower extremity to a greater extent.  I advised her that her plain films from January 2001 showed stable Ray cage placement, and the myelogram from 2001 showed no abnormal motion with minimal underfilling of the left 4 and 5 lumbar nerve roots.  She feels as though there has been some progression.  PHYSICAL EXAMINATION:  VITAL SIGNS:  Blood pressure 146/76, heart rate 101, respiratory rate 20, O2 saturation 97%, pain level of 7 out of 10.  MUSCULOSKELETAL/NEUROLOGIC:  She demonstrates intact motor strength in her lower extremities with straight leg raise sign significant for some mild discomfort at 80 degrees, localized to the lower back.  Hyperextension of her back in forward flexion minimally increases her discomfort.  IMPRESSION: 1. Chronic low back pain syndrome, which has a perceived difference in quality    of pain with underlying  lumbar degenerative disk disease, status post Ray    cage placement. 2. Neck discomfort, which has improved. 3. Other medical problems per Dr. Elba Barman.  DISPOSITION: 1. Continue on the OxyContin 80 mg twice a day. 2. Hydrocodone 10/660 one p.o. q.6h. p.r.n. breakthrough pain, #60, to last    well over a month. 3. Plain films of the back with flexion-extension. 4. Will consider an MRI if the plan films are negative if she has ongoing    discomfort. 5. Follow up with me in four weeks. DD:  04/19/01 TD:  04/20/01 Job: [id number]

## 2011-05-13 NOTE — H&P (Signed)
NAMERENAI, [name]                ACCOUNT NO.:  [id number]   MEDICAL RECORD NO.:  [id number]          PATIENT TYPE:  INP   LOCATION:  1609                         FACILITY:  [facility]   PHYSICIAN:  Helen Hashimoto, MD    DATE OF BIRTH:  1966-08-13   DATE OF ADMISSION:  07/24/2006  DATE OF DISCHARGE:                                HISTORY & PHYSICAL   PRIMARY PHYSICIAN:  The patient does not have a local physician and will be  admitted as unassigned.   CHIEF COMPLAINT:  Right axillary pain and redness.   HISTORY OF PRESENT ILLNESS:  This is a 45 year old Caucasian female with  past medical history of major medical problems, who presented with the above-  mentioned complaint.  She stated that she has been having this pain for the  last 3 weeks.  She went and saw her gynecologist, who gave her Cipro, but  that was not very helpful.  Last week, she went to see a doctor in [facility], who switched her antibiotic from Cipro to Bactrim and asked her to  come for followup last Saturday.  On Friday, some of her friends opened one  of her bumps in her axilla with a razor and drained some pus.  She went on  Saturday for a followup and was sent home on pain medications.  She came  into the ER today because the pain was very severe and is around 10/10 and  the bumps increased to around 4 and she cannot move her hand very well.  In  the ER, incision and drainage were done by the ER attending and sample was  sent for culture and [facility] was asked to evaluate for further  management.   PAST MEDICAL HISTORY:  Past medical history is significant for:  1. COPD.  2. Diabetes mellitus.  3. Hypertension.  4. History of hypokalemia.  5. GERD.   PAST SURGICAL HISTORY:  1. Status post hysterectomy.  2. Status post shoulder procedure.   MEDICATIONS:  1. Atenolol 50 mg p.o. once daily.  2. Lasix 40 mg p.o. once daily.  3. Metformin 500 mg p.o. twice daily.  4. Nexium 40 mg p.o. once  daily.  5. Ultram p.r.n.  6. Potassium chloride 20 mEq p.o. 3 times daily.  7. Spiriva once a day.  8. Albuterol two puffs q.4 h. p.r.n.  9. Atacand 32/12.5 mg p.o. once daily.   ALLERGIES:  The patient is allergic to ibuprofen and trazodone.   SOCIAL HISTORY:  The patient smokes around 3-4 packs a day since she was 45  years old.  She has been tried on many things to quit smoking, but she is  unsuccessful and she is asking about a pill to help her quit smoking.  She  drinks alcohol occasionally and denies recreational drugs.   FAMILY HISTORY:  Significant for hypertension.   REVIEW OF SYSTEMS:  CONSTITUTIONAL:  There is no fever, no weight changes.  EYES:  No blurred vision.  No pain.  No redness.  ENT:  No epistaxis, no  difficulty swallowing and no  tinnitus.  CARDIOVASCULAR:  No chest pain, no  shortness of breath, no palpitations.  RESPIRATORY:  No cough, no wheezes.  GI:  Positive for nausea, no vomiting, no diarrhea.  GU:  No hematuria, no  dysuria.  ID:  Positive for abscess.  HEMATOLOGY:  No bruises, no bleeding.  SKIN:  Positive for erythema, tenderness and redness in the right axilla and  over the right breast.  NEUROLOGIC:  No numbness, no tingling.  The rest of  the systems were reviewed and they were negative.   PHYSICAL EXAMINATION:  VITAL SIGNS:  Temperature is 97.7, blood pressure is  150/102, pulse 99, respiratory rate 22.  GENERAL APPEARANCE:  This is a middle-aged Caucasian female lying down in  bed in no acute distress.  HEENT:  Conjunctivae are normal.  The pupils are equal, round and reactive  to light and accommodation.  There is no ptosis.  Hearing is intact.  There  is no ear discharge or ear infection.  There is no nose discharge, infection  or bleeding.  Oral mucosa is moist and there is no pharyngeal erythema.  NECK:  Supple.  No JVD, no carotid bruit, no lymphadenopathy, no thyroid  enlargement, no thyroid tenderness.  CARDIOVASCULAR:  S1 and S2 are  heard.  There are no additional heart sounds.  There are no murmurs and no gallops are noticed.  RESPIRATORY:  The patient is breathing between 18-20.  No use of accessory  muscles, no intercostal retractions, no dullness, no rales, no rhonchi and  no wheezes.  ABDOMEN:  The abdomen is soft and not distended.  There is no tenderness and  no hepatosplenomegaly.  Bowel sounds are normal.  Umbilicus is central.  LOWER EXTREMITIES:  No edema, no rash and no varicose veins.  SKIN:  There is erythema in the right axilla and covering the right wrist  with redness over the same 2 areas.  The axilla is tender to touch and with  limited movement because of pain.  NEUROLOGICAL:  Cranial nerves are intact, II-XII.  There are no motor or  sensory deficits.  PSYCHIATRIC:  The patient alert, awake and oriented x3.  There is no recent  or remote memory impairment.   LABORATORY RESULTS:  WBC 13.5, hemoglobin 13.2, hematocrit 38.7, MCV 91.8,  platelet count is 269,000.  Sodium 133, potassium 3.4, chloride 99, bicarb  25, glucose 298,  BUN 5, creatinine 0.63, calcium 9.1.   IMPRESSION AND ASSESSMENT:  1. Right axilla and right wrist cellulitis with abscess.  This patient      already had incision and drainage of the abscess in the right axilla.      I will admit her to the regular medical floor because her cellulitis is      extensive and she has very severe pain.  I will start her on Unasyn      intravenously and I will follow the cultures from the abscess as well      as blood cultures.  I will put her on Tylenol as needed.  I will also      start her on some pain medications.  2. Hypokalemia.  I will give her extra Almyra Free and will follow her repeat      basic metabolic panel.  3. Diabetes mellitus.  We will continue her current medications and we      will start her on insulin sliding scale.  I will also get hemoglobin      A1c to assess  control in the last 3 months. 4. Hypertension.  We will  continue her current medications and will follow      her blood pressure.  5. Gastroesophageal reflux disease.  We will continue her Nexium.  6. History of chronic obstructive pulmonary disease.  This is stable at      the present time and we will continue her current medications.   TOTAL ASSESSMENT TIME:  Fifty-eight minutes.      Helen Hashimoto, MD  Electronically Signed     [name]/MEDQ  D:  07/24/2006  T:  07/24/2006  Job:  [id number]

## 2011-09-16 LAB — BASIC METABOLIC PANEL
BUN: 5 — ABNORMAL LOW
CO2: 26
Chloride: 93 — ABNORMAL LOW
Glucose, Bld: 347 — ABNORMAL HIGH
Potassium: 3.5
Sodium: 132 — ABNORMAL LOW

## 2011-09-16 LAB — DIFFERENTIAL
Basophils Absolute: 0.1
Eosinophils Absolute: 0.1
Eosinophils Relative: 1
Lymphocytes Relative: 19
Monocytes Absolute: 0.6

## 2011-09-16 LAB — CBC
HCT: 40
Hemoglobin: 14.1
MCHC: 35.2
MCV: 88.5
Platelets: 261
RDW: 12.2

## 2011-09-21 LAB — POCT I-STAT GLUCOSE: Operator id: 141321

## 2011-11-02 ENCOUNTER — Other Ambulatory Visit: Payer: Self-pay | Admitting: Urology

## 2011-12-22 ENCOUNTER — Encounter (HOSPITAL_BASED_OUTPATIENT_CLINIC_OR_DEPARTMENT_OTHER): Payer: Self-pay | Admitting: *Deleted

## 2011-12-22 NOTE — Progress Notes (Addendum)
Has been noncompliant with medications-stressed need to take as prescribed.To arrive at 0600,CBC,istat,PT,PTT,Type and screen,Ekg on arrival,will request recent cxr ( with chart).npo after mn, to refrain from smoking.will take nexium,use atrovent that am.

## 2011-12-23 ENCOUNTER — Other Ambulatory Visit: Payer: Self-pay | Admitting: Urology

## 2011-12-28 NOTE — Op Note (Signed)
History of Present Illness   [name] has stress incontinence. She has risk factors for neurogenic bladder and incomplete bladder emptying.   I went over a sling.   We talked about a sling in detail. Pros, cons, general surgical and anesthetic risks, and other options including behavioral therapy and watchful waiting were discussed. She understands that slings are generally successful in 90% of cases for stress incontinence, 50% for urge incontinence, and that in a small percentage of cases the incontinence can worsen. The risk of persistent, de novo, or worsening incontinence/dysfunction was discussed. Risks were described but not limited to the discussion of injury to neighboring structures including the bowel (with possible life-threatening sepsis and colostomy), bladder, urethra, vagina (all resulting in further surgery), and ureter (resulting in re-implantation). We also talked about the risk of retention requiring urethrolysis, extrusion requiring revision, and erosion resulting in further surgery. Bleeding risks and transfusion rates and the risk of infection were discussed. The risk of pelvic and abdominal pain syndromes, dyspareunia, and neuropathies were discussed. The need for CIC was described as well the usual post-operative course. The patient understands that she might not reach her treatment goal and that she might be worse following surgery.  I went over Macroplastique.  We talked about the lawsuits on TV.   Review of systems: No change in bowel or neurologic systems.   Twenty-five minutes or longer was used for discussion.  Urinalysis: Moderate bacteria. Urine was sent for culture.  She started having a little bit of dysuria. There is no other modifying factors or associated signs or symptoms. There is no other aggravating or relieving factors. The symptoms are mild to moderate in severity and persistent.   Today she underwent a number of tests which I personally reviewed.  Urinalysis: She voided 29 mL with an interrupted flow pattern at 4 mL per second.  Bladder scan: Bladder scan residual was 6 mL.    Past Medical History Problems  1. History of  Adult Sleep Apnea 780.57 2. History of  Anxiety (Symptom) 300.00 3. History of  Arthritis V13.4 4. History of  Depression 311 5. History of  Diabetes Mellitus 250.00 6. History of  Heartburn 787.1 7. History of  Hypercholesterolemia 272.0 8. History of  Hypertension 401.9  Surgical History Problems  1. History of  Foot Surgery 2. History of  Hernia Repair 3. History of  Knee Surgery 4. History of  Neck Surgery 5. History of  Shoulder Surgery 6. History of  Shoulder Surgery Right 7. History of  Spine Repair  Current Meds 1. Abilify 20 MG Oral Tablet; TAKE 1 TABLET DAILY; Therapy: (Recorded:22Mar2012) to 2. ALPRAZolam 2 MG Oral Tablet; 2 at bedtime; Therapy: (Recorded:22Mar2012) to 3. Amphetamine-Dextroamphetamine 20 MG Oral Tablet; 2 tablets daily; Therapy: [id number] to 4. Atrovent HFA 17 MCG/ACT Inhalation Aerosol Solution; Therapy: [id number] to 5. Ciprofloxacin HCl 250 MG Oral Tablet; TAKE 1 TABLET BID; Therapy: 04Apr2012 to  (Evaluate:11Apr2012); Last Rx:04Apr2012 6. Cymbalta 60 MG Oral Capsule Delayed Release Particles; 60mg ; 2 tablets daily; Therapy:  (Recorded:22Mar2012) to 7. Doxepin HCl 100 MG Oral Capsule; 2 capsules at bedtime; Therapy: [id number] to 8. Fenofibrate 160 MG Oral Tablet; TAKE 1 TABLET DAILY; Therapy: (Recorded:22Mar2012) to 9. Fluconazole 100 MG Oral Tablet; Take 100 mg daily x 3 days; Therapy: 04Apr2012 to (Last  Rx:04Apr2012) 10. KlonoPIN TABS; 2 tablets at night; Therapy: (Recorded:22Mar2012) to 11. Lantus 100 UNIT/ML Subcutaneous Solution; 44u; inject 1 x daily; Therapy: [id number] to 12. Lisinopril 10 MG Oral Tablet; 10mg ; 1  tablet daily; Therapy: (Recorded:22Mar2012) to 90. MetFORMIN HCl 750 MG Oral Tablet Extended Release 24 Hour; 750mg ; 3 tablets daily; Therapy:    (Recorded:22Mar2012) to 14. Naproxen Sodium 550 MG Oral Tablet; 1 tablet as needed; Therapy: 30Jun2011 to 15. NexIUM 40 MG Oral Capsule Delayed Release; 1 daily; Therapy: (Recorded:22Mar2012) to 16. Nitrofurantoin Macrocrystal 100 MG Oral Capsule; TAKE 100 MG Twice daily; Therapy: 04Apr2012   to (Evaluate:08May2012)  Requested for: [id number]; Last Rx:01May2012 17. ProAir HFA 108 (90 Base) MCG/ACT Inhalation Aerosol Solution; Therapy: 20Jan2012 to 18. Rozerem 8 MG Oral Tablet; TAKE 2 TABLETS AT BEDTIME; Therapy: (Recorded:22Mar2012) to 19. Tamsulosin HCl 0.4 MG Oral Capsule; TAKE 1 CAPSULE EVERY DAY; Therapy: [id number] to   (Evaluate:01Mar2012); Last [id number] 20. Toviaz 8 MG Oral Tablet Extended Release 24 Hour; Take one (1) tablet(s) by mouth once aday;   Therapy: 18Apr2012 to (Evaluate:13Apr2013); Last Rx:18Apr2012 21. VESIcare 5 MG Oral Tablet; a tablet daily for 30 days; Therapy: 18Apr2012 to (Last Rx:18Apr2012) 22. Victoza 18 MG/3ML Subcutaneous Solution; inject 1xdaily; Therapy: 06Sep2011 to  Allergies Medication  1. Ibuprofen CAPS 2. Trazodone and Deriv  Family History Problems  1. Maternal history of  Death In The Family Mother age 55;lymphoma cancer 2. Family history of  Family Health Status Number Of Children 1 son; 1 daughter 3. Paternal history of  Kidney Cancer V16.51  Social History Problems  1. Alcohol Use occ. 2. Caffeine Use 3-4 a day 3. Marital History - Divorced 4. Tobacco Use V15.82 3-4 pks a day since age 70 Denied  6. History of  Occupation:  Vitals Vital Signs [Data Includes: Last 1 Day]  [id number] 03:10PM  Blood Pressure: 167 / 93 Temperature: 98.2 F Heart Rate: 91  Results/Data  09 Jun 2011 3:21 PM   UA With REFLEX       COLOR YELLOW       APPEARANCE CLOUDY       SPECIFIC GRAVITY 1.025       pH 5.5       GLUCOSE NEG       BILIRUBIN NEG       KETONE NEG       BLOOD NEG       PROTEIN NEG       UROBILINOGEN 0.2       NITRITE NEG        LEUKOCYTE ESTERASE NEG       SQUAMOUS EPITHELIAL/HPF FEW       WBC 0-3       RBC 0-3       BACTERIA MODERATE       CRYSTALS NONE SEEN       CASTS NONE SEEN      Assessment Assessed  1. Urge And Stress Incontinence 788.33 2. Urinary Tract Infection 599.0 3. Incomplete Emptying Of Bladder 788.21  Plan   Discussion/Summary   [name] may have a urinary tract infection. She has had positive cultures before. Based upon her last culture, I gave her Macrodantin. I will have her come back next week and she will think about surgery.  After a thorough review of the management options for the patient's condition the patient  elected to proceed with surgical therapy as noted above. We have discussed the potential benefits and risks of the procedure, side effects of the proposed treatment, the likelihood of the patient achieving the goals of the procedure, and any potential problems that might occur during the procedure or recuperation. Informed consent has been obtained.

## 2011-12-29 ENCOUNTER — Encounter (HOSPITAL_BASED_OUTPATIENT_CLINIC_OR_DEPARTMENT_OTHER): Payer: Self-pay | Admitting: Anesthesiology

## 2011-12-29 ENCOUNTER — Encounter (HOSPITAL_BASED_OUTPATIENT_CLINIC_OR_DEPARTMENT_OTHER)
Admission: RE | Disposition: A | Discharge status: Home / Self Care | Payer: Self-pay | Source: Ambulatory Visit | Attending: Urology

## 2011-12-29 ENCOUNTER — Encounter (HOSPITAL_BASED_OUTPATIENT_CLINIC_OR_DEPARTMENT_OTHER): Payer: Self-pay | Admitting: *Deleted

## 2011-12-29 ENCOUNTER — Ambulatory Visit (HOSPITAL_BASED_OUTPATIENT_CLINIC_OR_DEPARTMENT_OTHER)
Admission: RE | Admit: 2011-12-29 | Discharge: 2011-12-29 | Disposition: A | Discharge status: Home / Self Care | Payer: Medicare Other | Source: Ambulatory Visit | Attending: Urology | Admitting: Urology

## 2011-12-29 ENCOUNTER — Other Ambulatory Visit: Payer: Self-pay

## 2011-12-29 ENCOUNTER — Ambulatory Visit (HOSPITAL_BASED_OUTPATIENT_CLINIC_OR_DEPARTMENT_OTHER): Payer: Medicare Other | Admitting: Anesthesiology

## 2011-12-29 DIAGNOSIS — E7800 Pure hypercholesterolemia, unspecified: Secondary | ICD-10-CM | POA: Insufficient documentation

## 2011-12-29 DIAGNOSIS — N3946 Mixed incontinence: Secondary | ICD-10-CM | POA: Insufficient documentation

## 2011-12-29 DIAGNOSIS — I10 Essential (primary) hypertension: Secondary | ICD-10-CM | POA: Insufficient documentation

## 2011-12-29 DIAGNOSIS — Z79899 Other long term (current) drug therapy: Secondary | ICD-10-CM | POA: Insufficient documentation

## 2011-12-29 DIAGNOSIS — R300 Dysuria: Secondary | ICD-10-CM | POA: Insufficient documentation

## 2011-12-29 DIAGNOSIS — G4730 Sleep apnea, unspecified: Secondary | ICD-10-CM | POA: Insufficient documentation

## 2011-12-29 DIAGNOSIS — R339 Retention of urine, unspecified: Secondary | ICD-10-CM | POA: Insufficient documentation

## 2011-12-29 DIAGNOSIS — E119 Type 2 diabetes mellitus without complications: Secondary | ICD-10-CM | POA: Insufficient documentation

## 2011-12-29 DIAGNOSIS — N390 Urinary tract infection, site not specified: Secondary | ICD-10-CM | POA: Insufficient documentation

## 2011-12-29 DIAGNOSIS — N8111 Cystocele, midline: Secondary | ICD-10-CM | POA: Insufficient documentation

## 2011-12-29 HISTORY — DX: Gastro-esophageal reflux disease without esophagitis: K21.9

## 2011-12-29 HISTORY — DX: Essential (primary) hypertension: I10

## 2011-12-29 HISTORY — DX: Anxiety disorder, unspecified: F41.9

## 2011-12-29 HISTORY — DX: Unspecified osteoarthritis, unspecified site: M19.90

## 2011-12-29 HISTORY — PX: PUBOVAGINAL SLING: SHX1035

## 2011-12-29 LAB — CBC
MCHC: 33.8 g/dL (ref 30.0–36.0)
Platelets: 224 10*3/uL (ref 150–400)
RDW: 13 % (ref 11.5–15.5)
WBC: 11.2 10*3/uL — ABNORMAL HIGH (ref 4.0–10.5)

## 2011-12-29 LAB — POCT I-STAT 4, (NA,K, GLUC, HGB,HCT)
Glucose, Bld: 130 mg/dL — ABNORMAL HIGH (ref 70–99)
Hemoglobin: 12.6 g/dL (ref 12.0–15.0)
Potassium: 3.4 mEq/L — ABNORMAL LOW (ref 3.5–5.1)
Sodium: 139 mEq/L (ref 135–145)

## 2011-12-29 LAB — APTT: aPTT: 31 seconds (ref 24–37)

## 2011-12-29 LAB — TYPE AND SCREEN

## 2011-12-29 LAB — PROTIME-INR: Prothrombin Time: 12.7 seconds (ref 11.6–15.2)

## 2011-12-29 LAB — GLUCOSE, CAPILLARY: Glucose-Capillary: 140 mg/dL — ABNORMAL HIGH (ref 70–99)

## 2011-12-29 SURGERY — CREATION, PUBOVAGINAL SLING
Anesthesia: General | Site: Bladder

## 2011-12-29 MED ORDER — GENTAMICIN IN SALINE 1.6-0.9 MG/ML-% IV SOLN
80.0000 mg | INTRAVENOUS | Status: AC
  Administered 2011-12-29: 80 mg via INTRAVENOUS

## 2011-12-29 MED ORDER — FENTANYL CITRATE 0.05 MG/ML IJ SOLN
INTRAMUSCULAR | Status: DC | PRN
  Administered 2011-12-29 (×4): 50 ug via INTRAVENOUS

## 2011-12-29 MED ORDER — STERILE WATER FOR IRRIGATION IR SOLN
Status: DC | PRN
  Administered 2011-12-29: 3000 mL

## 2011-12-29 MED ORDER — HYDROCODONE-ACETAMINOPHEN 5-500 MG PO TABS: 1.0000 | ORAL_TABLET | Freq: Four times a day (QID) | ORAL | Status: AC | PRN

## 2011-12-29 MED ORDER — INSULIN ASPART 100 UNIT/ML ~~LOC~~ SOLN
0.0000 [IU] | SUBCUTANEOUS | Status: DC
  Administered 2011-12-29: 2 [IU] via SUBCUTANEOUS

## 2011-12-29 MED ORDER — SODIUM CHLORIDE 0.9 % IR SOLN
Status: DC | PRN
  Administered 2011-12-29: 08:00:00

## 2011-12-29 MED ORDER — HYDROCODONE-ACETAMINOPHEN 5-325 MG PO TABS
1.0000 | ORAL_TABLET | Freq: Four times a day (QID) | ORAL | Status: AC | PRN
  Administered 2011-12-29: 1 via ORAL

## 2011-12-29 MED ORDER — FENTANYL CITRATE 0.05 MG/ML IJ SOLN: 25.0000 ug | INTRAMUSCULAR | Status: DC | PRN

## 2011-12-29 MED ORDER — LIDOCAINE HCL (CARDIAC) 20 MG/ML IV SOLN
INTRAVENOUS | Status: DC | PRN
  Administered 2011-12-29: 80 mg via INTRAVENOUS

## 2011-12-29 MED ORDER — VANCOMYCIN HCL IN DEXTROSE 1-5 GM/200ML-% IV SOLN
1000.0000 mg | Freq: Once | INTRAVENOUS | Status: AC
  Administered 2011-12-29: 1000 mg via INTRAVENOUS

## 2011-12-29 MED ORDER — SODIUM CHLORIDE 0.9 % IV SOLN
INTRAVENOUS | Status: DC | PRN
  Administered 2011-12-29: 09:00:00

## 2011-12-29 MED ORDER — LACTATED RINGERS IV SOLN
INTRAVENOUS | Status: DC
  Administered 2011-12-29 (×2): via INTRAVENOUS

## 2011-12-29 MED ORDER — FUROSEMIDE 10 MG/ML IJ SOLN
INTRAMUSCULAR | Status: DC | PRN
  Administered 2011-12-29: 20 mg via INTRAMUSCULAR

## 2011-12-29 MED ORDER — ESTRADIOL 0.1 MG/GM VA CREA
TOPICAL_CREAM | VAGINAL | Status: DC | PRN
  Administered 2011-12-29: 1 via VAGINAL

## 2011-12-29 MED ORDER — CIPROFLOXACIN HCL 250 MG PO TABS: 250.0000 mg | ORAL_TABLET | Freq: Two times a day (BID) | ORAL | Status: AC

## 2011-12-29 MED ORDER — LIDOCAINE-EPINEPHRINE (PF) 1 %-1:200000 IJ SOLN
INTRAMUSCULAR | Status: DC | PRN
  Administered 2011-12-29: 2 mL via INTRADERMAL

## 2011-12-29 MED ORDER — INDIGOTINDISULFONATE SODIUM 8 MG/ML IJ SOLN
INTRAMUSCULAR | Status: DC | PRN
  Administered 2011-12-29: 5 mL via INTRAVENOUS

## 2011-12-29 MED ORDER — INDIGOTINDISULFONATE SODIUM 8 MG/ML IJ SOLN
INTRAMUSCULAR | Status: DC | PRN
  Administered 2011-12-29 (×2): 5 mL via INTRAVENOUS

## 2011-12-29 MED ORDER — PROPOFOL 10 MG/ML IV EMUL
INTRAVENOUS | Status: DC | PRN
  Administered 2011-12-29: 50 mg via INTRAVENOUS
  Administered 2011-12-29: 200 mg via INTRAVENOUS
  Administered 2011-12-29: 100 mg via INTRAVENOUS

## 2011-12-29 MED ORDER — MIDAZOLAM HCL 5 MG/5ML IJ SOLN
INTRAMUSCULAR | Status: DC | PRN
  Administered 2011-12-29: 2 mg via INTRAVENOUS

## 2011-12-29 MED ORDER — PROMETHAZINE HCL 25 MG/ML IJ SOLN: 6.2500 mg | INTRAMUSCULAR | Status: DC | PRN

## 2011-12-29 MED ORDER — ONDANSETRON HCL 4 MG/2ML IJ SOLN
INTRAMUSCULAR | Status: DC | PRN
  Administered 2011-12-29: 4 mg via INTRAVENOUS

## 2011-12-29 SURGICAL SUPPLY — 58 items
ADAPTER CATH URET PLST 4-6FR (CATHETERS) ×2 IMPLANT
ADH SKN CLS APL DERMABOND .7 (GAUZE/BANDAGES/DRESSINGS) ×2
ADPR CATH URET STRL DISP 4-6FR (CATHETERS) ×2
BAG URINE DRAINAGE (UROLOGICAL SUPPLIES) ×1 IMPLANT
BLADE HEX COATED 2.75 (ELECTRODE) ×3 IMPLANT
BLADE SURG 10 STRL SS (BLADE) ×3 IMPLANT
BLADE SURG 15 STRL LF DISP TIS (BLADE) ×2 IMPLANT
BLADE SURG 15 STRL SS (BLADE) ×3
BLADE SURG ROTATE 9660 (MISCELLANEOUS) ×3 IMPLANT
CANISTER SUCT LVC 12 LTR MEDI- (MISCELLANEOUS) IMPLANT
CANISTER SUCTION 1200CC (MISCELLANEOUS) ×9 IMPLANT
CATH FOLEY 2WAY SLVR  5CC 16FR (CATHETERS) ×1
CATH FOLEY 2WAY SLVR 5CC 16FR (CATHETERS) ×2 IMPLANT
CATH URET 5FR 28IN OPEN ENDED (CATHETERS) ×2 IMPLANT
CLOTH BEACON ORANGE TIMEOUT ST (SAFETY) ×3 IMPLANT
COVER MAYO STAND STRL (DRAPES) ×4 IMPLANT
COVER TABLE BACK 60X90 (DRAPES) ×3 IMPLANT
DERMABOND ADVANCED (GAUZE/BANDAGES/DRESSINGS) ×1
DERMABOND ADVANCED .7 DNX12 (GAUZE/BANDAGES/DRESSINGS) ×2 IMPLANT
DRAIN PENROSE 18X1/4 LTX STRL (WOUND CARE) IMPLANT
DRAPE CAMERA CLOSED 9X96 (DRAPES) ×1 IMPLANT
DRAPE UNDERBUTTOCKS STRL (DRAPE) ×3 IMPLANT
ELECT REM PT RETURN 9FT ADLT (ELECTROSURGICAL) ×3
ELECTRODE REM PT RTRN 9FT ADLT (ELECTROSURGICAL) ×2 IMPLANT
GAUZE SPONGE 4X4 16PLY XRAY LF (GAUZE/BANDAGES/DRESSINGS) IMPLANT
GLOVE BIO SURGEON STRL SZ7.5 (GLOVE) ×8 IMPLANT
GLOVE BIOGEL M 7.0 STRL (GLOVE) ×1 IMPLANT
GLOVE ECLIPSE 6.0 STRL STRAW (GLOVE) ×1 IMPLANT
GLOVE INDICATOR 6.5 STRL GRN (GLOVE) ×1 IMPLANT
GOWN STRL NON-REIN LRG LVL3 (GOWN DISPOSABLE) ×6 IMPLANT
GOWN STRL REIN XL XLG (GOWN DISPOSABLE) ×3 IMPLANT
HOLDER FOLEY CATH W/STRAP (MISCELLANEOUS) ×3 IMPLANT
HOOK RETRACTION 12 ELAST STAY (MISCELLANEOUS) IMPLANT
NEEDLE HYPO 22GX1.5 SAFETY (NEEDLE) ×3 IMPLANT
NS IRRIG 500ML POUR BTL (IV SOLUTION) ×2 IMPLANT
PACK BASIN DAY SURGERY FS (CUSTOM PROCEDURE TRAY) ×3 IMPLANT
PACKING VAGINAL (PACKING) ×3 IMPLANT
PENCIL BUTTON HOLSTER BLD 10FT (ELECTRODE) ×3 IMPLANT
PLUG CATH AND CAP STER (CATHETERS) ×3 IMPLANT
RETRACTOR STERILE 25.8CMX11.3 (INSTRUMENTS) IMPLANT
SET IRRIG Y TYPE TUR BLADDER L (SET/KITS/TRAYS/PACK) ×3 IMPLANT
SHEET LAVH (DRAPES) ×3 IMPLANT
SLING SYSTEM SPARC (Sling) ×1 IMPLANT
SURGILUBE 2OZ TUBE FLIPTOP (MISCELLANEOUS) ×3 IMPLANT
SUT SILK 2 0 30  PSL (SUTURE)
SUT SILK 2 0 30 PSL (SUTURE) IMPLANT
SUT VIC AB 2-0 CT1 27 (SUTURE) ×6
SUT VIC AB 2-0 CT1 TAPERPNT 27 (SUTURE) ×4 IMPLANT
SUT VICRYL 4-0 PS2 18IN ABS (SUTURE) ×3 IMPLANT
SYR BULB IRRIGATION 50ML (SYRINGE) ×3 IMPLANT
SYR CONTROL 10ML LL (SYRINGE) ×3 IMPLANT
SYRINGE 10CC LL (SYRINGE) ×3 IMPLANT
TOWEL OR 17X24 6PK STRL BLUE (TOWEL DISPOSABLE) ×3 IMPLANT
TRAY DSU PREP LF (CUSTOM PROCEDURE TRAY) ×3 IMPLANT
TUBE CONNECTING 12X1/4 (SUCTIONS) ×6 IMPLANT
WATER STERILE IRR 3000ML UROMA (IV SOLUTION) ×3 IMPLANT
WATER STERILE IRR 500ML POUR (IV SOLUTION) IMPLANT
YANKAUER SUCT BULB TIP NO VENT (SUCTIONS) ×3 IMPLANT

## 2011-12-29 NOTE — Progress Notes (Signed)
Dr Matilde Sprang notified of urine cath results. New order to place foley cath in & D/C pt with foley cath

## 2011-12-29 NOTE — Progress Notes (Signed)
Pt reports unable to self cath & per Dr Matilde Sprang after 1st void do a bladder scan & cath pt. I&O cath done @ this time w 1743ml noted

## 2011-12-29 NOTE — Interval H&P Note (Signed)
History and Physical Interval Note:  12/29/2011 7:17 AM  [name]  has presented today for surgery, with the diagnosis of stress incontinence  The various methods of treatment have been discussed with the patient and family. After consideration of risks, benefits and other options for treatment, the patient has consented to  Procedure(s): PUBO-VAGINAL SLING as a surgical intervention .  The patients' history has been reviewed, patient examined, no change in status, stable for surgery.  I have reviewed the patients' chart and labs.  Questions were answered to the patient's satisfaction.     Samual Beals A

## 2011-12-29 NOTE — Transfer of Care (Signed)
Immediate Anesthesia Transfer of Care Note  Patient: [name]  Procedure(s) Performed:  PUBO-VAGINAL SLING - cysto and sparc sling ; CYSTOSCOPY  Patient Location: PACU  Anesthesia Type: General  Level of Consciousness: sedated and responds to stimulation  Airway & Oxygen Therapy: Patient Spontanous Breathing and Patient connected to face mask oxygen  Post-op Assessment: Report given to PACU RN  Post vital signs: Reviewed and stable  Complications: No apparent anesthesia complications

## 2011-12-29 NOTE — Progress Notes (Signed)
#  16 foley cath inserted without complications

## 2011-12-29 NOTE — Anesthesia Postprocedure Evaluation (Signed)
  Anesthesia Post-op Note  Patient: [name]  Procedure(s) Performed:  PUBO-VAGINAL SLING - cysto and sparc sling ; CYSTOSCOPY  Patient Location: PACU  Anesthesia Type: General  Level of Consciousness: awake and oriented  Airway and Oxygen Therapy: Patient Spontanous Breathing  Post-op Pain: mild  Post-op Assessment: Post-op Vital signs reviewed, Patient's Cardiovascular Status Stable, Respiratory Function Stable and Patent Airway  Post-op Vital Signs: stable  Complications: No apparent anesthesia complications

## 2011-12-29 NOTE — Anesthesia Preprocedure Evaluation (Signed)
Anesthesia Evaluation  Patient identified by MRN, date of birth, ID band Patient awake    Reviewed: Allergy & Precautions, H&P , NPO status , Patient's Chart, lab work & pertinent test results, reviewed documented beta blocker date and time   Airway Mallampati: II TM Distance: >3 FB Neck ROM: Full    Dental   Pulmonary sleep apnea , Current Smoker,  Non-compliant with CPAP clear to auscultation        Cardiovascular hypertension, Pt. on medications Regular Normal    Neuro/Psych Negative Neurological ROS  Negative Psych ROS   GI/Hepatic Neg liver ROS, Nl heart cath 2009   Endo/Other  Diabetes mellitus-, Poorly Controlled, Type 1, Insulin DependentPoor compliance with insulin No AM insulin Obesity  Renal/GU negative Renal ROS   SUI    Musculoskeletal negative musculoskeletal ROS (+)   Abdominal   Peds negative pediatric ROS (+)  Hematology negative hematology ROS (+)   Anesthesia Other Findings   Reproductive/Obstetrics negative OB ROS                           Anesthesia Physical Anesthesia Plan  ASA: III  Anesthesia Plan: General   Post-op Pain Management:    Induction: Intravenous  Airway Management Planned: Oral ETT  Additional Equipment:   Intra-op Plan:   Post-operative Plan: Extubation in OR  Informed Consent: I have reviewed the patients History and Physical, chart, labs and discussed the procedure including the risks, benefits and alternatives for the proposed anesthesia with the patient or authorized representative who has indicated his/her understanding and acceptance.     Plan Discussed with: CRNA and Surgeon  Anesthesia Plan Comments:         Anesthesia Quick Evaluation

## 2011-12-29 NOTE — H&P (Signed)
History of Present Illness  [name] has stress incontinence. She has risk factors for neurogenic bladder and incomplete bladder emptying.  I went over a sling.  We talked about a sling in detail. Pros, cons, general surgical and anesthetic risks, and other options including behavioral therapy and watchful waiting were discussed. She understands that slings are generally successful in 90% of cases for stress incontinence, 50% for urge incontinence, and that in a small percentage of cases the incontinence can worsen. The risk of persistent, de novo, or worsening incontinence/dysfunction was discussed. Risks were described but not limited to the discussion of injury to neighboring structures including the bowel (with possible life-threatening sepsis and colostomy), bladder, urethra, vagina (all resulting in further surgery), and ureter (resulting in re-implantation). We also talked about the risk of retention requiring urethrolysis, extrusion requiring revision, and erosion resulting in further surgery. Bleeding risks and transfusion rates and the risk of infection were discussed. The risk of pelvic and abdominal pain syndromes, dyspareunia, and neuropathies were discussed. The need for CIC was described as well the usual post-operative course. The patient understands that she might not reach her treatment goal and that she might be worse following surgery.  I went over Macroplastique.  We talked about the lawsuits on TV.  Review of systems: No change in bowel or neurologic systems.  Twenty-five minutes or longer was used for discussion.  Urinalysis: Moderate bacteria. Urine was sent for culture.  She started having a little bit of dysuria. There is no other modifying factors or associated signs or symptoms. There is no other aggravating or relieving factors. The symptoms are mild to moderate in severity and persistent.  Today she underwent a number of tests which I personally reviewed. Urinalysis: She  voided 29 mL with an interrupted flow pattern at 4 mL per second.  Bladder scan: Bladder scan residual was 6 mL.  Past Medical History  Problems  1. History of Adult Sleep Apnea 780.57  2. History of Anxiety (Symptom) 300.00  3. History of Arthritis V13.4  4. History of Depression 311  5. History of Diabetes Mellitus 250.00  6. History of Heartburn 787.1  7. History of Hypercholesterolemia 272.0  8. History of Hypertension 401.9  Surgical History  Problems  1. History of Foot Surgery  2. History of Hernia Repair  3. History of Knee Surgery  4. History of Neck Surgery  5. History of Shoulder Surgery  6. History of Shoulder Surgery Right  7. History of Spine Repair  Current Meds  1. Abilify 20 MG Oral Tablet; TAKE 1 TABLET DAILY; Therapy: (Recorded:22Mar2012) to  2. ALPRAZolam 2 MG Oral Tablet; 2 at bedtime; Therapy: (Recorded:22Mar2012) to  3. Amphetamine-Dextroamphetamine 20 MG Oral Tablet; 2 tablets daily; Therapy: [id number] to  4. Atrovent HFA 17 MCG/ACT Inhalation Aerosol Solution; Therapy: [id number] to  5. Ciprofloxacin HCl 250 MG Oral Tablet; TAKE 1 TABLET BID; Therapy: 04Apr2012 to  (Evaluate:11Apr2012); Last Rx:04Apr2012  6. Cymbalta 60 MG Oral Capsule Delayed Release Particles; 60mg ; 2 tablets daily; Therapy:  (Recorded:22Mar2012) to  7. Doxepin HCl 100 MG Oral Capsule; 2 capsules at bedtime; Therapy: [id number] to  8. Fenofibrate 160 MG Oral Tablet; TAKE 1 TABLET DAILY; Therapy: (Recorded:22Mar2012) to  9. Fluconazole 100 MG Oral Tablet; Take 100 mg daily x 3 days; Therapy: 04Apr2012 to (Last  Rx:04Apr2012)  10. KlonoPIN TABS; 2 tablets at night; Therapy: (Recorded:22Mar2012) to  11. Lantus 100 UNIT/ML Subcutaneous Solution; 44u; inject 1 x daily; Therapy: [id number] to  12. Lisinopril 10 MG  Oral Tablet; 10mg ; 1 tablet daily; Therapy: (Recorded:22Mar2012) to  39. MetFORMIN HCl 750 MG Oral Tablet Extended Release 24 Hour; 750mg ; 3 tablets daily; Therapy:    (Recorded:22Mar2012) to  14. Naproxen Sodium 550 MG Oral Tablet; 1 tablet as needed; Therapy: 30Jun2011 to  15. NexIUM 40 MG Oral Capsule Delayed Release; 1 daily; Therapy: (Recorded:22Mar2012) to  16. Nitrofurantoin Macrocrystal 100 MG Oral Capsule; TAKE 100 MG Twice daily; Therapy: 04Apr2012  to (Evaluate:08May2012) Requested for: [id number]; Last Rx:01May2012  17. ProAir HFA 108 (90 Base) MCG/ACT Inhalation Aerosol Solution; Therapy: 20Jan2012 to  18. Rozerem 8 MG Oral Tablet; TAKE 2 TABLETS AT BEDTIME; Therapy: (Recorded:22Mar2012) to  19. Tamsulosin HCl 0.4 MG Oral Capsule; TAKE 1 CAPSULE EVERY DAY; Therapy: [id number] to  (Evaluate:01Mar2012); Last [id number]  20. Toviaz 8 MG Oral Tablet Extended Release 24 Hour; Take one (1) tablet(s) by mouth once aday;  Therapy: 18Apr2012 to (Evaluate:13Apr2013); Last Rx:18Apr2012  21. VESIcare 5 MG Oral Tablet; a tablet daily for 30 days; Therapy: 18Apr2012 to (Last Rx:18Apr2012)  22. Victoza 18 MG/3ML Subcutaneous Solution; inject 1xdaily; Therapy: 06Sep2011 to  Allergies  Medication  1. Ibuprofen CAPS  2. Trazodone and Deriv  Family History  Problems  1. Maternal history of Death In The Family Mother  age 57;lymphoma cancer  2. Family history of Family Health Status Number Of Children  1 son; 1 daughter  3. Paternal history of Kidney Cancer V16.51  Social History  Problems  1. Alcohol Use  occ.  2. Caffeine Use  3-4 a day  3. Marital History - Divorced  4. Tobacco Use V15.82  3-4 pks a day since age 78  Denied  73. History of Occupation:  Vitals  Vital Signs [Data Includes: Last 1 Day]  [id number] 03:10PM  Blood Pressure: 167 / 93  Temperature: 98.2 F  Heart Rate: 91  Results/Data  09 Jun 2011 3:21 PM  UA With REFLEX  COLOR YELLOW  APPEARANCE CLOUDY  SPECIFIC GRAVITY 1.025  pH 5.5  GLUCOSE NEG  BILIRUBIN NEG  KETONE NEG  BLOOD NEG  PROTEIN NEG  UROBILINOGEN 0.2  NITRITE NEG  LEUKOCYTE ESTERASE NEG  SQUAMOUS  EPITHELIAL/HPF FEW  WBC 0-3  RBC 0-3  BACTERIA MODERATE  CRYSTALS NONE SEEN  CASTS NONE SEEN  Assessment  Assessed  1. Urge And Stress Incontinence 788.33  2. Urinary Tract Infection 599.0  3. Incomplete Emptying Of Bladder 788.21  Plan  Discussion/Summary  [name] may have a urinary tract infection. She has had positive cultures before. Based upon her last culture, I gave her Macrodantin. I will have her come back next week and she will think about surgery.  After a thorough review of the management options for the patient's condition the patient  elected to proceed with surgical therapy as noted above. We have discussed the potential benefits and risks of the procedure, side effects of the proposed treatment, the likelihood of the patient achieving the goals of the procedure, and any potential problems that might occur during the procedure or recuperation. Informed consent has been obtained.

## 2011-12-29 NOTE — Progress Notes (Signed)
Dr Matilde Sprang in to see pt prior to D/C as ordered

## 2011-12-29 NOTE — Op Note (Signed)
Preoperative diagnosis: Stress urinary incontinence  Postoperative diagnosis: Stress urinary incontinence Surgery: Sling bladder suspension SPARC and cystoscopy Surgeon Dr. Nicki Reaper Plato Alspaugh  [name] has stress urinary incontinence affecting her quality of life. She is at high risk of postoperative retention but consented to the above procedure. Preoperative laboratory tests were normal. She was MRSA positive and was given appropriate preoperative antibiotics. Leg position was good. Extra care was taken to minimize the risk of compartment syndrome neuropathy and DVT  The patient had a small cystocele. Throughout the case she had abdominal breathing which was taken into consideration would level of anesthesia and when I passed the trochars throughout the case. Trendelenburg was also used.  I made 21 cm incisions 1 fingerbreadth above the symphysis pubis 1.5 cm lateral to the midline. I made a 2 cm suburethral incision underneath the mid urethra appropriate depth after instilling 5 cc of a lidocaine epinephrine mixture. I sharply dissected to the urethrovesical angle bilaterally.  With the bladder emptied I passed the trocar on top of and along the back of the symphysis pubis using my proximal technique delivering it onto the pulp of the index finger bilaterally.  I cystoscoped the patient and there was no trocar in the bladder or injury to bladder or urethra. There was no movement of bladder with movement of the trocar.  There was a significant delay in getting blue jets bilaterally of indigo carmine. In fact her urine output was very low throughout the case. Initially I asked for Indigo Carmine for a second time and weighted for a total of 10 and probably closer to 15 minutes for efflux. I then asked anesthesia to give more fluid and weighted 5 or 10 more minutes. I did not note any urine output or if there was it was minimal. I double and triple checked the position of my trochars and they were well  away from the urethra and trigone and ureters.  At this point I decided to place the sling assuming that she would start producing urine. I attached the sling and with the abdomen quiet brought the sling up through the retropubic space. I cut below the blue dots. I irrigated the she's. I removed the sheaths. I tensioned the sling over the fat part of a moderate-sized Kelly clamp I was very pleased with the position and tension of the sling especially recognizing her retention risk.  I irrigated all incisions. I closed the anterior vaginal wall with running 2-0 Vicryl followed by my 2 interrupted sutures. I cut the sling below the skin level and closed the abdominal incisions with 4-0 Vicryl and later followed by Dermabond.  It was obvious again that she was making little to no urine and there is no blue coloration in the bladder. I therefore asked for x-ray planning to perform retrograde ureterograms. I did not feel that the ureters were injured but a lot of make certain before we will the patient out. At this point she been asleep for approximately 45 minutes waiting for blue dye before the x-ray C-arm was placed on the table I unclamped the Foley catheter and there was a moderate amount of urine that was blue.  I cystoscoped the patient and twice saw good blue jets bilaterally.  The bladder was emptied and the Foley catheter was replaced and a catheter plug and vaginal pack were utilized.  Loss was less than 50 mL. She was stable throughout. The patient normally takes 40 mg of Lasix but has not been taking it.  She appeared to be very sensitive to the 20 mg IV Lasix given. In my opinion the ureteral issue was a pre-renal issue and not a post renal issue.

## 2011-12-29 NOTE — Progress Notes (Signed)
Bladder scan & bedside w >947ml noted.

## 2011-12-29 NOTE — Anesthesia Procedure Notes (Addendum)
Procedure Name: LMA Insertion Date/Time: 12/29/2011 7:41 AM Performed by: Bethena Roys Pre-anesthesia Checklist: Patient identified, Emergency Drugs available, Suction available and Patient being monitored Patient Re-evaluated:Patient Re-evaluated prior to inductionOxygen Delivery Method: Circle System Utilized Preoxygenation: Pre-oxygenation with 100% oxygen Intubation Type: IV induction Ventilation: Mask ventilation without difficulty LMA: LMA inserted LMA Size: 4.0 Number of attempts: 1 Intubation method: gauze roll on mouth. Placement Confirmation: positive ETCO2 Dental Injury: Teeth and Oropharynx as per pre-operative assessment

## 2011-12-29 NOTE — H&P (View-Only) (Signed)
Has been noncompliant with medications-stressed need to take as prescribed.To arrive at 0600,CBC,istat,PT,PTT,Type and screen,Ekg on arrival,will request recent cxr ( with chart).npo after mn, to refrain from smoking.will take nexium,use atrovent that am.

## 2011-12-30 ENCOUNTER — Encounter (HOSPITAL_BASED_OUTPATIENT_CLINIC_OR_DEPARTMENT_OTHER): Payer: Self-pay | Admitting: Urology

## 2012-04-10 ENCOUNTER — Other Ambulatory Visit: Payer: Self-pay | Admitting: Urology

## 2012-05-01 ENCOUNTER — Encounter (HOSPITAL_BASED_OUTPATIENT_CLINIC_OR_DEPARTMENT_OTHER): Payer: Self-pay | Admitting: *Deleted

## 2012-05-01 NOTE — Progress Notes (Signed)
NPO AFTER MN. ARRIVES AT 0600. NEEDS ISTAT. CURRENT EKG W/ CHART AND IN EPIC. WILL TAKE NEXIUM , XANAX AND DO ATROVENT INHALER AM OF SURG W/ SIP OF WATER.

## 2012-05-04 ENCOUNTER — Encounter (HOSPITAL_BASED_OUTPATIENT_CLINIC_OR_DEPARTMENT_OTHER)
Admission: RE | Disposition: A | Discharge status: Home / Self Care | Payer: Self-pay | Source: Ambulatory Visit | Attending: Urology

## 2012-05-04 ENCOUNTER — Encounter (HOSPITAL_BASED_OUTPATIENT_CLINIC_OR_DEPARTMENT_OTHER): Payer: Self-pay | Admitting: *Deleted

## 2012-05-04 ENCOUNTER — Ambulatory Visit (HOSPITAL_BASED_OUTPATIENT_CLINIC_OR_DEPARTMENT_OTHER): Payer: Medicare Other | Admitting: Anesthesiology

## 2012-05-04 ENCOUNTER — Encounter (HOSPITAL_BASED_OUTPATIENT_CLINIC_OR_DEPARTMENT_OTHER): Payer: Self-pay | Admitting: Anesthesiology

## 2012-05-04 ENCOUNTER — Ambulatory Visit (HOSPITAL_BASED_OUTPATIENT_CLINIC_OR_DEPARTMENT_OTHER)
Admission: RE | Admit: 2012-05-04 | Discharge: 2012-05-04 | Disposition: A | Discharge status: Home / Self Care | Payer: Medicare Other | Source: Ambulatory Visit | Attending: Urology | Admitting: Urology

## 2012-05-04 DIAGNOSIS — E119 Type 2 diabetes mellitus without complications: Secondary | ICD-10-CM | POA: Insufficient documentation

## 2012-05-04 DIAGNOSIS — Z79899 Other long term (current) drug therapy: Secondary | ICD-10-CM | POA: Insufficient documentation

## 2012-05-04 DIAGNOSIS — N393 Stress incontinence (female) (male): Secondary | ICD-10-CM | POA: Insufficient documentation

## 2012-05-04 DIAGNOSIS — I10 Essential (primary) hypertension: Secondary | ICD-10-CM | POA: Insufficient documentation

## 2012-05-04 DIAGNOSIS — E7800 Pure hypercholesterolemia, unspecified: Secondary | ICD-10-CM | POA: Insufficient documentation

## 2012-05-04 DIAGNOSIS — G4730 Sleep apnea, unspecified: Secondary | ICD-10-CM | POA: Insufficient documentation

## 2012-05-04 HISTORY — DX: Stress incontinence (female) (male): N39.3

## 2012-05-04 HISTORY — DX: Major depressive disorder, single episode, unspecified: F32.9

## 2012-05-04 HISTORY — DX: Insomnia, unspecified: G47.00

## 2012-05-04 HISTORY — PX: CYSTOSCOPY WITH INJECTION: SHX1424

## 2012-05-04 HISTORY — DX: Depression, unspecified: F32.A

## 2012-05-04 HISTORY — DX: Shortness of breath: R06.02

## 2012-05-04 LAB — GLUCOSE, CAPILLARY: Glucose-Capillary: 141 mg/dL — ABNORMAL HIGH (ref 70–99)

## 2012-05-04 LAB — POCT I-STAT 4, (NA,K, GLUC, HGB,HCT)
Hemoglobin: 13.3 g/dL (ref 12.0–15.0)
Potassium: 3.6 mEq/L (ref 3.5–5.1)
Sodium: 138 mEq/L (ref 135–145)

## 2012-05-04 SURGERY — CYSTOSCOPY, WITH INJECTION OF BLADDER NECK OR BLADDER WALL
Anesthesia: General | Site: Urethra | Wound class: Clean Contaminated

## 2012-05-04 MED ORDER — LIDOCAINE HCL (CARDIAC) 20 MG/ML IV SOLN
INTRAVENOUS | Status: DC | PRN
  Administered 2012-05-04: 100 mg via INTRAVENOUS

## 2012-05-04 MED ORDER — FENTANYL CITRATE 0.05 MG/ML IJ SOLN
25.0000 ug | INTRAMUSCULAR | Status: DC | PRN
  Administered 2012-05-04 (×2): 25 ug via INTRAVENOUS

## 2012-05-04 MED ORDER — CIPROFLOXACIN IN D5W 400 MG/200ML IV SOLN
400.0000 mg | INTRAVENOUS | Status: AC
  Administered 2012-05-04 (×2): 400 mg via INTRAVENOUS

## 2012-05-04 MED ORDER — ALBUTEROL SULFATE (2.5 MG/3ML) 0.083% IN NEBU
2.5000 mg | INHALATION_SOLUTION | RESPIRATORY_TRACT | Status: DC | PRN
  Administered 2012-05-04: 2.5 mg via RESPIRATORY_TRACT

## 2012-05-04 MED ORDER — VANCOMYCIN HCL IN DEXTROSE 1-5 GM/200ML-% IV SOLN
1000.0000 mg | INTRAVENOUS | Status: AC
  Administered 2012-05-04: 1000 mg via INTRAVENOUS

## 2012-05-04 MED ORDER — PROPOFOL 10 MG/ML IV EMUL
INTRAVENOUS | Status: DC | PRN
  Administered 2012-05-04: 200 mg via INTRAVENOUS
  Administered 2012-05-04: 50 mg via INTRAVENOUS

## 2012-05-04 MED ORDER — CIPROFLOXACIN HCL 250 MG PO TABS: 250.0000 mg | ORAL_TABLET | Freq: Two times a day (BID) | ORAL | Status: AC

## 2012-05-04 MED ORDER — FENTANYL CITRATE 0.05 MG/ML IJ SOLN
INTRAMUSCULAR | Status: DC | PRN
  Administered 2012-05-04 (×2): 50 ug via INTRAVENOUS
  Administered 2012-05-04: 25 ug

## 2012-05-04 MED ORDER — LACTATED RINGERS IV SOLN
INTRAVENOUS | Status: DC
  Administered 2012-05-04: 08:00:00 via INTRAVENOUS
  Administered 2012-05-04: 100 mL/h via INTRAVENOUS

## 2012-05-04 MED ORDER — MIDAZOLAM HCL 5 MG/5ML IJ SOLN
INTRAMUSCULAR | Status: DC | PRN
  Administered 2012-05-04: 2 mg via INTRAVENOUS

## 2012-05-04 MED ORDER — ONDANSETRON HCL 4 MG/2ML IJ SOLN
INTRAMUSCULAR | Status: DC | PRN
  Administered 2012-05-04: 4 mg via INTRAVENOUS

## 2012-05-04 MED ORDER — HYDROCODONE-ACETAMINOPHEN 5-500 MG PO TABS: 1.0000 | ORAL_TABLET | Freq: Four times a day (QID) | ORAL | Status: AC | PRN

## 2012-05-04 MED ORDER — LACTATED RINGERS IV SOLN: INTRAVENOUS | Status: DC

## 2012-05-04 MED ORDER — MEPERIDINE HCL 25 MG/ML IJ SOLN: 6.2500 mg | INTRAMUSCULAR | Status: DC | PRN

## 2012-05-04 MED ORDER — POTASSIUM CITRATE-CITRIC ACID 1100-334 MG/5ML PO SOLN
30.0000 meq | Freq: Once | ORAL | Status: AC
  Administered 2012-05-04: 30 meq via ORAL

## 2012-05-04 MED ORDER — PROMETHAZINE HCL 25 MG/ML IJ SOLN: 6.2500 mg | INTRAMUSCULAR | Status: DC | PRN

## 2012-05-04 MED ORDER — STERILE WATER FOR IRRIGATION IR SOLN
Status: DC | PRN
  Administered 2012-05-04: 2

## 2012-05-04 SURGICAL SUPPLY — 18 items
BAG DRAIN URO-CYSTO SKYTR STRL (DRAIN) ×2 IMPLANT
BAG DRN UROCATH (DRAIN) ×1
CANISTER SUCT LVC 12 LTR MEDI- (MISCELLANEOUS) ×2 IMPLANT
CATH SILICONE 16FRX5CC (CATHETERS) ×1 IMPLANT
CLOTH BEACON ORANGE TIMEOUT ST (SAFETY) ×2 IMPLANT
DRAPE CAMERA CLOSED 9X96 (DRAPES) ×2 IMPLANT
GLOVE INDICATOR 6.5 STRL GRN (GLOVE) ×2 IMPLANT
GLOVE SURG SS PI 7.5 STRL IVOR (GLOVE) ×1 IMPLANT
GOWN PREVENTION PLUS LG XLONG (DISPOSABLE) ×2 IMPLANT
GOWN STRL REIN XL XLG (GOWN DISPOSABLE) ×2 IMPLANT
INJECTION MACROPLASIQ 2.5ML UN (Female Continence) IMPLANT
MACROPLASTIQUE 2.5ML UNIT (Female Continence) ×4 IMPLANT
NDL RIGID UROPLASTY (NEEDLE) IMPLANT
NDL SAFETY ECLIPSE 18X1.5 (NEEDLE) ×1 IMPLANT
NEEDLE HYPO 18GX1.5 SHARP (NEEDLE) ×2
NEEDLE RIGID UROPLASTY (NEEDLE) ×2 IMPLANT
PACK CYSTOSCOPY (CUSTOM PROCEDURE TRAY) ×2 IMPLANT
WATER STERILE IRR 3000ML UROMA (IV SOLUTION) ×3 IMPLANT

## 2012-05-04 NOTE — Transfer of Care (Signed)
Immediate Anesthesia Transfer of Care Note  Patient: [name]  Procedure(s) Performed: Procedure(s) (LRB): CYSTOSCOPY WITH INJECTION (N/A)  Patient Location: PACU  Anesthesia Type: General  Level of Consciousness: awake and oriented  Airway & Oxygen Therapy: Patient Spontanous Breathing and Patient connected to face mask oxygen  Post-op Assessment: Report given to PACU RN and Post -op Vital signs reviewed and stable  Post vital signs: Reviewed and stable  Complications: No apparent anesthesia complications

## 2012-05-04 NOTE — Op Note (Signed)
Preoperative diagnosis: Stress incontinence Postoperative diagnosis: Stress incontinence Surgery: Injection of Macroplastique and cystoscopy Surgeon: Dr. Nicki Reaper Josehua Hammar  The patient has stress incontinence and consented to the above procedure. Antibiotics were given. Vancomycin was also given because of her past history of Staphylococcus infection. The special injection scope was utilized. Lead position was good  I injected 1.5 syringes of Macroplastique. The initial injection of approximately 2/3 of one syringe was set 2:00 with an excellent blood effect. The second injection was arguably a little bit superficial with minimal bleb at 10:00. I was happy with the 6:00 injection just to the left of the midline. I was cognizant of the patient having a sling and was trying to minimize any risk of erosion  The bladder mucosa and trigone was normal. The ureters were normal. There is no evidence of cystitis. The bladder was drained. I was pleased with the

## 2012-05-04 NOTE — Anesthesia Preprocedure Evaluation (Addendum)
Anesthesia Evaluation  Patient identified by MRN, date of birth, ID band Patient awake    Reviewed: Allergy & Precautions, H&P , NPO status , Patient's Chart, lab work & pertinent test results, reviewed documented beta blocker date and time   Airway Mallampati: III TM Distance: >3 FB Neck ROM: Full    Dental No notable dental hx.    Pulmonary sleep apnea , Current Smoker,  Non-compliant with CPAP   + decreased breath sounds      Cardiovascular hypertension, Pt. on medications Rhythm:Regular Rate:Normal     Neuro/Psych PSYCHIATRIC DISORDERS Anxiety Depression negative neurological ROS  negative psych ROS   GI/Hepatic Neg liver ROS, GERD-  Medicated and Controlled,Nl heart cath 2009   Endo/Other  Diabetes mellitus-, Type 1, Insulin DependentPoor compliance with insulin No AM insulin Obesity  Renal/GU negative Renal ROS   SUI    Musculoskeletal negative musculoskeletal ROS (+)   Abdominal   Peds negative pediatric ROS (+)  Hematology negative hematology ROS (+)   Anesthesia Other Findings   Reproductive/Obstetrics negative OB ROS                         Anesthesia Physical  Anesthesia Plan  ASA: III  Anesthesia Plan: General   Post-op Pain Management:    Induction: Intravenous, Rapid sequence and Cricoid pressure planned  Airway Management Planned: Oral ETT  Additional Equipment:   Intra-op Plan:   Post-operative Plan: Extubation in OR  Informed Consent: I have reviewed the patients History and Physical, chart, labs and discussed the procedure including the risks, benefits and alternatives for the proposed anesthesia with the patient or authorized representative who has indicated his/her understanding and acceptance.   Dental advisory given  Plan Discussed with: CRNA and Surgeon  Anesthesia Plan Comments:       Anesthesia Quick Evaluation

## 2012-05-04 NOTE — Anesthesia Procedure Notes (Signed)
Procedure Name: Intubation Date/Time: 05/04/2012 7:46 AM Performed by: Lyndee Leo Pre-anesthesia Checklist: Patient identified, Emergency Drugs available, Suction available and Patient being monitored Patient Re-evaluated:Patient Re-evaluated prior to inductionOxygen Delivery Method: Circle system utilized Preoxygenation: Pre-oxygenation with 100% oxygen Intubation Type: IV induction Ventilation: Mask ventilation without difficulty Grade View: Grade IV Tube type: Oral Number of attempts: 3 Airway Equipment and Method: Video-laryngoscopy Placement Confirmation: ETT inserted through vocal cords under direct vision,  positive ETCO2 and breath sounds checked- equal and bilateral Secured at: 21 cm Tube secured with: Tape Dental Injury: Teeth and Oropharynx as per pre-operative assessment  Difficulty Due To: Difficulty was anticipated, Difficult Airway- due to large tongue, Difficult Airway- due to reduced neck mobility and Difficult Airway- due to limited oral opening Future Recommendations: Recommend- induction with short-acting agent, and alternative techniques readily available Comments: Miller 2 x 2; Glidescope x1

## 2012-05-04 NOTE — Anesthesia Postprocedure Evaluation (Signed)
  Anesthesia Post-op Note  Patient: [name]  Procedure(s) Performed: Procedure(s) (LRB): CYSTOSCOPY WITH INJECTION (N/A)  Patient Location: PACU  Anesthesia Type: General  Level of Consciousness: awake and alert   Airway and Oxygen Therapy: Patient Spontanous Breathing  Post-op Pain: mild  Post-op Assessment: Post-op Vital signs reviewed, Patient's Cardiovascular Status Stable, Respiratory Function Stable, Patent Airway and No signs of Nausea or vomiting  Post-op Vital Signs: stable  Complications: No apparent anesthesia complications

## 2012-05-04 NOTE — H&P (Signed)
History of Present Illness   [name] has complicated stress incontinence with a poorly contractile bladder. She had a sling recognizing that the risk of retention was very high and we decided to go ahead with things recognizing that she was young and that I could go back and cut the sling if needed.   The sling was done in January of this year and she was actually dry initially. A residual January 7 was 110 mL. She had 1700 cc in her bladder after her surgery. She had abdominal pain that improved. I was very hopeful it was going to help her and she had another residual that was 282 mL. On January 21 she was dry. When I saw her in March she was leaking. She says once again she is back to baseline. When I examined her she initially did not leak with a cough but finally leaked a few drops with a hard cough. She had appropriate grade 1 hypermobility.   Her residual was 66 mL on March 11. Her abdominal incision has healed beautifully and she had Staphylococcus but no MRSA. She took doxycycline.  I went over Macroplastique.   We talked about urethral injectables in detail. Pros, cons, general surgical and anesthetic risks, and other options including behavioral therapy and watchful waiting were discussed. She understands that injectables are generally successful in 40-70% of cases for stress incontinence, less than 50% for urge incontinence, and that in a small percentage of cases the incontinence can worsen. Risks were described but not limited to the risk of persistent, de novo, or worsening incontinence and flow symptoms were discussed. The need for long-term CIC is possible but rare. We also talked about the risk of erosion into bladder, urethra, and/or vagina with sequelae. Bleeding, infection, pain, dysuria, dyspareunia, and urethral irritation risks were discussed. Retreatment rates were discussed. The patient understands that she might not reach her treatment goal and that she might be worse following  surgery.  She understands theoretically I could tip the balance to permanent retention. This does concern her. Watchful waiting and no treatment was discussed. I do not think she could have another sling.   She definitively wants to proceed with Macrodantin. Retreatment rates were also discussed and I think she has appropriate goals hoping for significant improvement but probably not dryness. I will give her Vancomycin because she was MRSA positive. She will also be given ciprofloxacin.   Her incisions looked completely normal. She has also been given some Diflucan for 3 days.    Past Medical History Problems  1. History of  Adult Sleep Apnea 780.57 2. History of  Anxiety (Symptom) 300.00 3. History of  Arthritis V13.4 4. History of  Depression 311 5. History of  Diabetes Mellitus 250.00 6. History of  Heartburn 787.1 7. History of  Hypercholesterolemia 272.0 8. History of  Hypertension 401.9  Surgical History Problems  1. History of  Foot Surgery 2. History of  Hernia Repair 3. History of  Knee Surgery 4. History of  Neck Surgery 5. History of  Shoulder Surgery 6. History of  Shoulder Surgery Right 7. History of  Spine Repair 8. History of  Vaginal Sling Operation For Stress Incontinence  Current Meds 1. Abilify 20 MG Oral Tablet; TAKE 1 TABLET DAILY; Therapy: (Recorded:22Mar2012) to 2. ALPRAZolam 2 MG Oral Tablet; 2 at bedtime; Therapy: (Recorded:22Mar2012) to 3. Amphetamine-Dextroamphetamine 20 MG Oral Tablet; 2 tablets daily; Therapy: [id number] to 4. Atrovent HFA 17 MCG/ACT Inhalation Aerosol Solution; Therapy: [id number] to 5.  BD Pen Needle Short U/F 31G X 8 MM Miscellaneous; Therapy: 11Oct2012 to 6. Cymbalta 60 MG Oral Capsule Delayed Release Particles; 60mg ; 2 tablets daily; Therapy:  (Recorded:22Mar2012) to 7. Doxepin HCl 100 MG Oral Capsule; 2 capsules at bedtime; Therapy: [id number] to 8. Doxycycline Monohydrate 100 MG Oral Tablet; TAKE 1 TABLET Every twelve hours;  Therapy:  [id number] to (Evaluate:25Mar2013)  Requested for: [id number]; Last Rx:11Mar2013 9. Fenofibrate 160 MG Oral Tablet; TAKE 1 TABLET DAILY; Therapy: (Recorded:22Mar2012) to 10. Fluconazole 100 MG Oral Tablet; Take 100 mg daily x 3 days; Therapy: [id number] to (Last   Rx:11Mar2013)  Requested for: [id number] 11. Furosemide 40 MG Oral Tablet; Therapy: [id number] to 12. KlonoPIN TABS; 2 tablets at night; Therapy: (Recorded:22Mar2012) to 35. Lantus 100 UNIT/ML Subcutaneous Solution; 44u; inject 1 x daily; Therapy: [id number] to 14. Lisinopril 10 MG Oral Tablet; 10mg ; 1 tablet daily; Therapy: (Recorded:22Mar2012) to 15. MetFORMIN HCl ER 750 MG Oral Tablet Extended Release 24 Hour; 750mg ; 3 tablets daily;   Therapy: (Recorded:22Mar2012) to 16. Naproxen Sodium 550 MG Oral Tablet; 1 tablet as needed; Therapy: 30Jun2011 to 17. NexIUM 40 MG Oral Capsule Delayed Release; 1 daily; Therapy: (Recorded:22Mar2012) to 28. OXcarbazepine 300 MG Oral Tablet; Therapy: [id number] to 19. Oxycodone-Acetaminophen 7.5-325 MG Oral Tablet; Therapy: [id number] to 20. ProAir HFA 108 (90 Base) MCG/ACT Inhalation Aerosol Solution; Therapy: 20Jan2012 to 21. Rozerem 8 MG Oral Tablet; TAKE 2 TABLETS AT BEDTIME; Therapy: (Recorded:22Mar2012) to 22. Spironolactone 25 MG Oral Tablet; Therapy: [id number] to 23. Tamsulosin HCl 0.4 MG Oral Capsule; TAKE 1 CAPSULE EVERY DAY; Therapy: [id number] to   (Evaluate:01Mar2012); Last [id number] 24. Victoza 18 MG/3ML Subcutaneous Solution; inject 1xdaily; Therapy: 06Sep2011 to  Allergies Medication  1. Ibuprofen CAPS 2. Trazodone and Deriv  Family History Problems  1. Maternal history of  Death In The Family Mother age 16;lymphoma cancer 2. Family history of  Family Health Status Number Of Children 1 son; 1 daughter 3. Paternal history of  Kidney Cancer V16.51  Social History Problems  1. Alcohol Use occ. 2. Caffeine Use 3-4 a day 3. Marital History - Divorced 4. Tobacco Use  V15.82 3-4 pks a day since age 73 Denied  76. History of  Occupation:  Vitals Vital Signs [Data Includes: Last 1 Day]  15Apr2013 09:39AM  Blood Pressure: 133 / 83 Temperature: 97.4 F Heart Rate: 102  Results/Data  Urine [Data Includes: Last 1 Day]   15Apr2013  COLOR YELLOW   APPEARANCE CLEAR   SPECIFIC GRAVITY <1.005   pH 6.0   GLUCOSE > 1000 mg/dL  BILIRUBIN NEG   KETONE NEG mg/dL  BLOOD TRACE   PROTEIN NEG mg/dL  UROBILINOGEN 0.2 mg/dL  NITRITE NEG   LEUKOCYTE ESTERASE NEG   SQUAMOUS EPITHELIAL/HPF RARE   WBC 0-3 WBC/hpf  RBC NONE SEEN RBC/hpf  BACTERIA NONE SEEN   CRYSTALS NONE SEEN   CASTS NONE SEEN    Plan   Discussion/Summary   Ms. Barham would like to proceed with Macroplastique. She does not want to live with her symptoms. She understands she has a complicated clinical situation. We will proceed accordingly. Hopefully we can reach her treatment goal or greatly improve her continence without threatening her ability to empty. I was concerned about repeating the sling in a woman with her bad bladder in the face of high residuals after the initial sling.  After a thorough review of the management options for the patient's condition the patient  elected to proceed with surgical therapy as noted above. We have discussed the  potential benefits and risks of the procedure, side effects of the proposed treatment, the likelihood of the patient achieving the goals of the procedure, and any potential problems that might occur during the procedure or recuperation. Informed consent has been obtained.

## 2012-05-04 NOTE — Interval H&P Note (Signed)
History and Physical Interval Note:  05/04/2012 7:08 AM  [name]  has presented today for surgery, with the diagnosis of STRESS INCONTINENCE  The various methods of treatment have been discussed with the patient and family. After consideration of risks, benefits and other options for treatment, the patient has consented to  Procedure(s) (LRB): CYSTOSCOPY WITH INJECTION (N/A) as a surgical intervention .  The patients' history has been reviewed, patient examined, no change in status, stable for surgery.  I have reviewed the patients' chart and labs.  Questions were answered to the patient's satisfaction.     Hewitt Garner A

## 2012-05-04 NOTE — Discharge Instructions (Signed)
I have reviewed discharge instructions in detail with the patient. They will follow-up with me or their physician as scheduled. My nurse will also be calling the patients as per protocol.  °Post Anesthesia Home Care Instructions ° °Activity: °Get plenty of rest for the remainder of the day. A responsible adult should stay with you for 24 hours following the procedure.  °For the next 24 hours, DO NOT: °-Drive a car °-Operate machinery °-Drink alcoholic beverages °-Take any medication unless instructed by your physician °-Make any legal decisions or sign important papers. ° °Meals: °Start with liquid foods such as gelatin or soup. Progress to regular foods as tolerated. Avoid greasy, spicy, heavy foods. If nausea and/or vomiting occur, drink only clear liquids until the nausea and/or vomiting subsides. Call your physician if vomiting continues. ° °Special Instructions/Symptoms: °Your throat may feel dry or sore from the anesthesia or the breathing tube placed in your throat during surgery. If this causes discomfort, gargle with warm salt water. The discomfort should disappear within 24 hours. °CYSTOSCOPY HOME CARE INSTRUCTIONS ° °Activity: °Rest for the remainder of the day.  Do not drive or operate equipment today.  You may resume normal activities in one to two days as instructed by your physician.  ° °Meals: °Drink plenty of liquids and eat light foods such as gelatin or soup this evening.  You may return to a normal meal plan tomorrow. ° °Return to Work: °You may return to work in one to two days or as instructed by your physician. ° °Special Instructions / Symptoms: °Call your physician if any of these symptoms occur: ° ° -persistent or heavy bleeding ° -bleeding which continues after first few urination ° -large blood clots that are difficult to pass ° -urine stream diminishes or stops completely ° -fever equal to or higher than 101 degrees Farenheit. ° -cloudy urine with a strong, foul odor ° -severe  pain ° °Females should always wipe from front to back after elimination.  You may feel some burning pain when you urinate.  This should disappear with time.  Applying moist heat to the lower abdomen or a hot tub bath may help relieve the pain. \ ° °Follow-Up / Date of Return Visit to Your Physician:  *** °Call for an appointment to arrange follow-up. ° °Patient Signature:  ________________________________________________________ ° °Nurse's Signature:  ________________________________________________________ ° °

## 2012-05-07 ENCOUNTER — Encounter (HOSPITAL_BASED_OUTPATIENT_CLINIC_OR_DEPARTMENT_OTHER): Payer: Self-pay | Admitting: Urology

## 2012-05-09 NOTE — Op Note (Signed)
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 Nicki Reaper Ronisha Herringshaw  The patient has stress incontinence and consented to the above procedure. Antibiotics were given. Vancomycin was also given because of her past history of Staphylococcus infection. The special injection scope was utilized. Lead position was good  I injected 1.5 syringes of Macroplastique. The initial injection of approximately 2/3 of one syringe was set 2:00 with an excellent blood effect. The second injection was arguably a little bit superficial with minimal bleb at 10:00. I was happy with the 6:00 injection just to the left of the midline. I was cognizant of the patient having a sling and was trying to minimize any risk of erosion  The bladder mucosa and trigone was normal. The ureters were normal. There is no evidence of cystitis. The bladder was drained. I was pleased with the

## 2012-05-10 ENCOUNTER — Encounter (HOSPITAL_BASED_OUTPATIENT_CLINIC_OR_DEPARTMENT_OTHER): Payer: Self-pay

## 2012-06-04 ENCOUNTER — Ambulatory Visit: Payer: Medicare Other | Admitting: *Deleted

## 2012-07-25 ENCOUNTER — Other Ambulatory Visit: Payer: Self-pay | Admitting: Urology

## 2012-07-30 ENCOUNTER — Encounter: Payer: Medicare Other | Attending: Family Medicine | Admitting: *Deleted

## 2012-07-30 ENCOUNTER — Encounter: Payer: Self-pay | Admitting: *Deleted

## 2012-07-30 VITALS — Ht 65.0 in | Wt 215.4 lb

## 2012-07-30 DIAGNOSIS — E119 Type 2 diabetes mellitus without complications: Secondary | ICD-10-CM | POA: Insufficient documentation

## 2012-07-30 DIAGNOSIS — Z713 Dietary counseling and surveillance: Secondary | ICD-10-CM | POA: Insufficient documentation

## 2012-07-30 DIAGNOSIS — E109 Type 1 diabetes mellitus without complications: Secondary | ICD-10-CM

## 2012-07-30 NOTE — Patient Instructions (Signed)
Plan: Consider taking Novolog and NPH insulins before first meal when you get up in the afternoon and again before last meal of the day (3-4 AM) to have insulin coverage for meals and while you are sleeping.  Check with your pharmacist to see which strips are most affordable for you (ReliaOn or Abbott Freestyle) Consider checking your BG daily to provide data to make food and insulin decisions with going forward. Consider adjusting your fruit juice intake to 8 oz at a time to help reduce BG values and help you feel better Aim for 4 Carb Choices per meal (60 grams) including any fruit juice or Gatorade that you may be drinking

## 2012-07-30 NOTE — Progress Notes (Signed)
  Medical Nutrition Therapy:  Appt start time: 1630 end time:  1730.  Assessment:  Primary concerns today: patient here for diabetes education. She states history of diabetes for the last 15 years and received group instruction at time of diagnosis. She is not consistent in taking her medications, eating habits or sleep habits due to severe case of insomnia. She is in a lot of pain in her hands and feet, and has been diagnosed with neuropathy. She tests her BG infrequently and has difficulty paying for strips. She states she takes insulin twice a day, if she remembers, and does not know what the action time is for either of them. She is also taking Victoza injections and believes that it is a 3rd type of insulin.  MEDICATIONS: see list. Diabetes medications include what she states is now NPH, Novolog and Victoza. She mentions 70/30 but is not clear on dose or how it relates to NPH or Novolog   DIETARY INTAKE:  Usual eating pattern includes 1-3 meals and 0-3 snacks per day.  Everyday foods include poor variety of some food groups.  Avoided foods include rice, noodles, sodas.    24-hr recall:  B (7 PM): bag of pork rinds OR sandwich, whatever is in the house  Snk ( AM): none  L ( 10-12  PM): same  Snk ( PM): peanuts and sunflower seeds D (3 AM): none Snk ( PM): none Beverages: Gatorade, water, cappuccino's, fruit juices out of the bottle  Usual physical activity: none due to pain in feet  Estimated energy needs: 1500 calories 170 g carbohydrates 112 g protein 42 g fat  Progress Towards Goal(s):  In progress.   Nutritional Diagnosis:  NB-1.1 Food and nutrition-related knowledge deficit As related to diabetes management.  As evidenced by stated A1c of 13%.    Intervention:  Nutrition counseling and diabetes education initiated. Discussed basic physiology of diabetes, action time of Novolog and NPH insulin as well as action of Victoza, SMBG and choices for strips in terms of cost,  A1c,  and basic Carb Counting. Recommended she decrease fruit juices to 8 oz servings and Gatorade to 16 oz serivngs to represent 2 Carb Choices of the 3-4 she should have per meal. Discussed improvement in symptoms of high BG if she can moderate her carb intake and also take her insulin at more consistent times. Plan: Consider taking Novolog and NPH insulins before first meal when you get up in the afternoon and again before last meal of the day (3-4 AM) to have insulin coverage for meals and while you are sleeping.  Check with your pharmacist to see which strips are most affordable for you (ReliaOn or Abbott Freestyle) Consider checking your BG daily to provide data to make food and insulin decisions with going forward. Consider adjusting your fruit juice intake to 8 oz at a time to help reduce BG values and help you feel better Aim for 4 Carb Choices per meal (60 grams) including any fruit juice or Gatorade that you may be drinking  Handouts given during visit include: Living Well with Diabetes Carb Counting handouts Freestyle Meter Starter Kit: Lot #[id number], Expiration Date: 08/2012   ReliOn Meter information with prices of strips available  Monitoring/Evaluation:  Dietary intake, exercise, SMBG, and body weight in 6 weeks, after her scheduled surgery on September 3rd.

## 2012-08-22 ENCOUNTER — Encounter (HOSPITAL_BASED_OUTPATIENT_CLINIC_OR_DEPARTMENT_OTHER): Payer: Self-pay | Admitting: *Deleted

## 2012-08-22 NOTE — Progress Notes (Signed)
NPO AFTER MN. ARRIVES AT 1100. NEEDS ISTAT. CURRENT EKG IN EPIC AND CHART. WILL TAKE XANAX, NEXIUM, AND ATROVENT INHALER AM OF SURG W/ SIP OF WATER.

## 2012-08-24 NOTE — H&P (Signed)
History of Present Illness   [name] still has her stress incontinence. She thinks things have improved a little bit in the last several weeks but she is still leaking a lot especially when she coughs. She has mild stable frequency.   Her last residual was normal. Review of systems: No change in bowel or neurologic status.   She would like to proceed with a second Macroplastique.    Past Medical History Problems  1. History of  Adult Sleep Apnea 780.57 2. History of  Anxiety (Symptom) 300.00 3. History of  Arthritis V13.4 4. History of  Depression 311 5. History of  Diabetes Mellitus 250.00 6. History of  Heartburn 787.1 7. History of  Hypercholesterolemia 272.0 8. History of  Hypertension 401.9  Surgical History Problems  1. History of  Endoscopic Injection Of Implant Material Into Bladder Neck 2. History of  Endoscopic Injection Of Implant Material Into Bladder Neck 3. History of  Foot Surgery 4. History of  Hernia Repair 5. History of  Knee Surgery 6. History of  Neck Surgery 7. History of  Shoulder Surgery Right 8. History of  Shoulder Surgery 9. History of  Spine Repair 10. History of  Vaginal Sling Operation For Stress Incontinence  Current Meds 1. Abilify 20 MG Oral Tablet; TAKE 1 TABLET DAILY; Therapy: (Recorded:22Mar2012) to 2. ALPRAZolam 2 MG Oral Tablet; 2 at bedtime; Therapy: (Recorded:22Mar2012) to 3. Amphetamine-Dextroamphetamine 20 MG Oral Tablet; 2 tablets daily; Therapy: [id number] to 4. Atrovent HFA 17 MCG/ACT Inhalation Aerosol Solution; Therapy: [id number] to 5. BD Pen Needle Short U/F 31G X 8 MM Miscellaneous; Therapy: 11Oct2012 to 6. Cymbalta 60 MG Oral Capsule Delayed Release Particles; 60mg ; 2 tablets daily; Therapy:  (Recorded:22Mar2012) to 7. Doxepin HCl 100 MG Oral Capsule; 2 capsules at bedtime; Therapy: [id number] to 8. Fenofibrate 160 MG Oral Tablet; TAKE 1 TABLET DAILY; Therapy: (Recorded:22Mar2012) to 9. Furosemide 40 MG Oral Tablet; Therapy:  [id number] to 10. KlonoPIN TABS; 2 tablets at night; Therapy: (Recorded:22Mar2012) to 11. Lantus 100 UNIT/ML Subcutaneous Solution; 44u; inject 1 x daily; Therapy: [id number] to 12. Lisinopril 10 MG Oral Tablet; 10mg ; 1 tablet daily; Therapy: (Recorded:22Mar2012) to 67. MetFORMIN HCl ER 750 MG Oral Tablet Extended Release 24 Hour; 750mg ; 3 tablets daily;   Therapy: (Recorded:22Mar2012) to 14. Naproxen Sodium 550 MG Oral Tablet; 1 tablet as needed; Therapy: 30Jun2011 to 15. NexIUM 40 MG Oral Capsule Delayed Release; 1 daily; Therapy: (Recorded:22Mar2012) to 16. NovoLIN 70/30 (70-30) 100 UNIT/ML Subcutaneous Suspension; Therapy: [id number] to 17. NovoLOG FlexPen 100 UNIT/ML Subcutaneous Solution; Therapy: 24Apr2013 to 18. OXcarbazepine 300 MG Oral Tablet; Therapy: [id number] to 19. OxyCODONE HCl 5 MG Oral Tablet; Therapy: [id number] to 20. Oxycodone-Acetaminophen 7.5-325 MG Oral Tablet; Therapy: [id number] to 21. Pristiq 50 MG Oral Tablet Extended Release 24 Hour; Therapy: [id number] to 22. ProAir HFA 108 (90 Base) MCG/ACT Inhalation Aerosol Solution; Therapy: 20Jan2012 to 23. Rozerem 8 MG Oral Tablet; TAKE 2 TABLETS AT BEDTIME; Therapy: (Recorded:22Mar2012) to 24. Spironolactone 25 MG Oral Tablet; Therapy: [id number] to 25. Tamsulosin HCl 0.4 MG Oral Capsule; TAKE 1 CAPSULE EVERY DAY; Therapy: [id number] to   (Evaluate:01Mar2012); Last [id number]. Victoza 18 MG/3ML Subcutaneous Solution; inject 1xdaily; Therapy: 06Sep2011 to  Allergies Medication  1. Ibuprofen CAPS 2. Trazodone and Deriv  Family History Problems  1. Maternal history of  Death In The Family Mother age 49;lymphoma cancer 2. Family history of  Family Health Status Number Of Children 1 son; 1 daughter 3. Paternal history of  Kidney Cancer V16.51  Social History  Problems  1. Alcohol Use occ. 2. Caffeine Use 3-4 a day 3. Marital History - Divorced 4. Tobacco Use V15.82 3-4 pks a day since age 22 Denied  53. History of   Occupation:  Vitals Vital Signs [Data Includes: Last 1 Day]  [id number] 04:16PM  BMI Calculated: 34.66 BSA Calculated: 2.01 Height: 5 ft 5 in Weight: 208 lb  Blood Pressure: 126 / 86 Temperature: 97.9 F Heart Rate: 96  Assessment Assessed  1. Urge And Stress Incontinence 788.33 2. Incomplete Emptying Of Bladder 788.21  Plan   Discussion/Summary   We will schedule [name] in approximately one month for another Macroplastique. We will proceed accordingly. Hopefully she will reach her treatment goal.   After a thorough review of the management options for the patient's condition the patient  elected to proceed with surgical therapy as noted above. We have discussed the potential benefits and risks of the procedure, side effects of the proposed treatment, the likelihood of the patient achieving the goals of the procedure, and any potential problems that might occur during the procedure or recuperation. Informed consent has been obtained.    Signatures

## 2012-08-28 ENCOUNTER — Encounter (HOSPITAL_BASED_OUTPATIENT_CLINIC_OR_DEPARTMENT_OTHER)
Admission: RE | Disposition: A | Discharge status: Home / Self Care | Payer: Self-pay | Source: Ambulatory Visit | Attending: Urology

## 2012-08-28 ENCOUNTER — Encounter (HOSPITAL_BASED_OUTPATIENT_CLINIC_OR_DEPARTMENT_OTHER): Payer: Self-pay | Admitting: Anesthesiology

## 2012-08-28 ENCOUNTER — Ambulatory Visit (HOSPITAL_BASED_OUTPATIENT_CLINIC_OR_DEPARTMENT_OTHER): Payer: Medicare Other | Admitting: Anesthesiology

## 2012-08-28 ENCOUNTER — Ambulatory Visit (HOSPITAL_BASED_OUTPATIENT_CLINIC_OR_DEPARTMENT_OTHER)
Admission: RE | Admit: 2012-08-28 | Discharge: 2012-08-28 | Disposition: A | Discharge status: Home / Self Care | Payer: Medicare Other | Source: Ambulatory Visit | Attending: Urology | Admitting: Urology

## 2012-08-28 DIAGNOSIS — E7800 Pure hypercholesterolemia, unspecified: Secondary | ICD-10-CM | POA: Insufficient documentation

## 2012-08-28 DIAGNOSIS — G4730 Sleep apnea, unspecified: Secondary | ICD-10-CM | POA: Insufficient documentation

## 2012-08-28 DIAGNOSIS — N3642 Intrinsic sphincter deficiency (ISD): Secondary | ICD-10-CM | POA: Insufficient documentation

## 2012-08-28 DIAGNOSIS — N3946 Mixed incontinence: Secondary | ICD-10-CM | POA: Insufficient documentation

## 2012-08-28 DIAGNOSIS — E119 Type 2 diabetes mellitus without complications: Secondary | ICD-10-CM | POA: Insufficient documentation

## 2012-08-28 DIAGNOSIS — R339 Retention of urine, unspecified: Secondary | ICD-10-CM | POA: Insufficient documentation

## 2012-08-28 DIAGNOSIS — I10 Essential (primary) hypertension: Secondary | ICD-10-CM | POA: Insufficient documentation

## 2012-08-28 DIAGNOSIS — Z794 Long term (current) use of insulin: Secondary | ICD-10-CM | POA: Insufficient documentation

## 2012-08-28 DIAGNOSIS — Z79899 Other long term (current) drug therapy: Secondary | ICD-10-CM | POA: Insufficient documentation

## 2012-08-28 HISTORY — PX: CYSTOSCOPY WITH INJECTION: SHX1424

## 2012-08-28 LAB — POCT I-STAT 4, (NA,K, GLUC, HGB,HCT)
HCT: 46 % (ref 36.0–46.0)
Hemoglobin: 15.6 g/dL — ABNORMAL HIGH (ref 12.0–15.0)
Potassium: 4.2 mEq/L (ref 3.5–5.1)
Sodium: 133 mEq/L — ABNORMAL LOW (ref 135–145)

## 2012-08-28 SURGERY — CYSTOSCOPY, WITH INJECTION OF BLADDER NECK OR BLADDER WALL
Anesthesia: General | Site: Urethra | Wound class: Clean Contaminated

## 2012-08-28 MED ORDER — CIPROFLOXACIN HCL 250 MG PO TABS: 250.0000 mg | ORAL_TABLET | Freq: Two times a day (BID) | ORAL | Status: AC

## 2012-08-28 MED ORDER — CIPROFLOXACIN IN D5W 400 MG/200ML IV SOLN
400.0000 mg | INTRAVENOUS | Status: AC
  Administered 2012-08-28: 400 mg via INTRAVENOUS

## 2012-08-28 MED ORDER — SUCCINYLCHOLINE CHLORIDE 20 MG/ML IJ SOLN
INTRAMUSCULAR | Status: DC | PRN
  Administered 2012-08-28: 100 mg via INTRAVENOUS

## 2012-08-28 MED ORDER — ONDANSETRON HCL 4 MG/2ML IJ SOLN
INTRAMUSCULAR | Status: DC | PRN
  Administered 2012-08-28: 4 mg via INTRAVENOUS

## 2012-08-28 MED ORDER — DEXAMETHASONE SODIUM PHOSPHATE 4 MG/ML IJ SOLN
INTRAMUSCULAR | Status: DC | PRN
  Administered 2012-08-28: 8 mg via INTRAVENOUS

## 2012-08-28 MED ORDER — PROPOFOL 10 MG/ML IV BOLUS
INTRAVENOUS | Status: DC | PRN
  Administered 2012-08-28: 180 mg via INTRAVENOUS

## 2012-08-28 MED ORDER — FENTANYL CITRATE 0.05 MG/ML IJ SOLN
INTRAMUSCULAR | Status: DC | PRN
  Administered 2012-08-28 (×2): 50 ug via INTRAVENOUS

## 2012-08-28 MED ORDER — LACTATED RINGERS IV SOLN
INTRAVENOUS | Status: DC
  Administered 2012-08-28: 100 mL/h via INTRAVENOUS
  Administered 2012-08-28: 15:00:00 via INTRAVENOUS

## 2012-08-28 MED ORDER — MIDAZOLAM HCL 5 MG/5ML IJ SOLN
INTRAMUSCULAR | Status: DC | PRN
  Administered 2012-08-28: 2 mg via INTRAVENOUS

## 2012-08-28 MED ORDER — INSULIN ASPART 100 UNIT/ML ~~LOC~~ SOLN
8.0000 [IU] | Freq: Once | SUBCUTANEOUS | Status: AC
  Administered 2012-08-28: 8 [IU] via SUBCUTANEOUS

## 2012-08-28 MED ORDER — LIDOCAINE HCL (CARDIAC) 20 MG/ML IV SOLN
INTRAVENOUS | Status: DC | PRN
  Administered 2012-08-28: 60 mg via INTRAVENOUS

## 2012-08-28 SURGICAL SUPPLY — 21 items
BAG DRAIN URO-CYSTO SKYTR STRL (DRAIN) ×2 IMPLANT
BAG DRN UROCATH (DRAIN) ×1
CANISTER SUCT LVC 12 LTR MEDI- (MISCELLANEOUS) ×1 IMPLANT
CATH ROBINSON RED A/P 12FR (CATHETERS) IMPLANT
CATH SILICONE 16FRX5CC (CATHETERS) ×1 IMPLANT
CLOTH BEACON ORANGE TIMEOUT ST (SAFETY) ×2 IMPLANT
DRAPE CAMERA CLOSED 9X96 (DRAPES) ×2 IMPLANT
GLOVE BIOGEL PI IND STRL 6.5 (GLOVE) IMPLANT
GLOVE BIOGEL PI INDICATOR 6.5 (GLOVE) ×1
GLOVE SKINSENSE NS SZ7.5 (GLOVE) ×1
GLOVE SKINSENSE STRL SZ7.5 (GLOVE) ×1 IMPLANT
GOWN PREVENTION PLUS LG XLONG (DISPOSABLE) ×2 IMPLANT
GOWN STRL REIN XL XLG (GOWN DISPOSABLE) ×2 IMPLANT
INJECTION MACROPLASIQ 2.5ML UN (Female Continence) ×2 IMPLANT
MACROPLASTIQUE 2.5ML UNIT (Female Continence) ×4 IMPLANT
NEEDLE RIGID UROPLASTY (NEEDLE) ×2 IMPLANT
PACK CYSTOSCOPY (CUSTOM PROCEDURE TRAY) ×2 IMPLANT
SYR 20CC LL (SYRINGE) IMPLANT
SYR 30ML LL (SYRINGE) ×4 IMPLANT
SYR BULB IRRIGATION 50ML (SYRINGE) IMPLANT
WATER STERILE IRR 3000ML UROMA (IV SOLUTION) ×2 IMPLANT

## 2012-08-28 NOTE — Transfer of Care (Signed)
Immediate Anesthesia Transfer of Care Note  Patient: [name]  Procedure(s) Performed: Procedure(s) (LRB): CYSTOSCOPY WITH INJECTION (N/A)  Patient Location: PACU  Anesthesia Type: General  Level of Consciousness: awake, oriented, sedated and patient cooperative  Airway & Oxygen Therapy: Patient Spontanous Breathing and Patient connected to face mask oxygen  Post-op Assessment: Report given to PACU RN and Post -op Vital signs reviewed and stable  Post vital signs: Reviewed and stable  Complications: No apparent anesthesia complications

## 2012-08-28 NOTE — Op Note (Signed)
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 Nicki Reaper Atziry Baranski  Ms. Pistone has the above diagnoses. She strains to urinate. She is at high risk for retention. She has failed the sling. She had mild improvement of 1 Macroplastique  Preoperative antibiotics were given. Lead position was good.  With the specialize scope I cystoscoped the bladder. Trigone and bladder mucosa were normal. With the flow on full she did have a small opening at the bladder neck. I a good visibility with a fairly straight urethra  I injected more at 4 and 8:00 to stay away from the sling to prevent any erosion of the sling  I injected full-strength at 4:00 with a nice elevation of the bladder neck. With the same technique and treating the urethra just beyond the mid urethra at a 30 angle and advancing to the first black.and then to the second black.parallel to the urethra I injected again at 8:00 but stopped at around one half of a syringe. She had excellent coaptation and a did not want to over inject.  I kept the bladder empty during and after the case with a latex free catheter. I scoped at the end and the second injection was minimally superficial but there was no extravasation. Overall I was happy with the coaptation and the procedure.  Bladder was emptied and patient taken to recovery room

## 2012-08-28 NOTE — Anesthesia Preprocedure Evaluation (Addendum)
Anesthesia Evaluation  Patient identified by MRN, date of birth, ID band Patient awake    Reviewed: Allergy & Precautions, H&P , NPO status , Patient's Chart, lab work & pertinent test results, reviewed documented beta blocker date and time   History of Anesthesia Complications Negative for: history of anesthetic complications  Airway Mallampati: III TM Distance: >3 FB Neck ROM: Full    Dental No notable dental hx. (+) Teeth Intact, Poor Dentition and Dental Advisory Given   Pulmonary shortness of breath and with exertion, sleep apnea (Noncompliant with CPAP) , Current Smoker (105 Pak years (3ppd)),  Non-compliant with CPAP   + decreased breath sounds      Cardiovascular hypertension, Pt. on medications Rhythm:Regular Rate:Normal     Neuro/Psych PSYCHIATRIC DISORDERS Anxiety Depression Bipolar Disorder Chronic pain; narcotic dependence negative neurological ROS  negative psych ROS   GI/Hepatic Neg liver ROS, GERD-  Medicated and Controlled,Nl heart cath 2009   Endo/Other  Diabetes mellitus-, Poorly Controlled, Type 1, Insulin DependentMorbid obesityPoor compliance with insulin No AM insulin Obesity  Renal/GU negative Renal ROS   SUI    Musculoskeletal negative musculoskeletal ROS (+)   Abdominal   Peds negative pediatric ROS (+)  Hematology negative hematology ROS (+)   Anesthesia Other Findings   Reproductive/Obstetrics negative OB ROS                          Anesthesia Physical Anesthesia Plan  ASA: III  Anesthesia Plan: General   Post-op Pain Management:    Induction: Intravenous  Airway Management Planned: Oral ETT  Additional Equipment:   Intra-op Plan:   Post-operative Plan: Extubation in OR  Informed Consent: I have reviewed the patients History and Physical, chart, labs and discussed the procedure including the risks, benefits and alternatives for the proposed  anesthesia with the patient or authorized representative who has indicated his/her understanding and acceptance.   Dental advisory given  Plan Discussed with: CRNA  Anesthesia Plan Comments:         Anesthesia Quick Evaluation

## 2012-08-28 NOTE — Anesthesia Procedure Notes (Signed)
Procedure Name: Intubation Date/Time: 08/28/2012 1:40 PM Performed by: Denna Haggard D Pre-anesthesia Checklist: Patient identified, Emergency Drugs available, Suction available and Patient being monitored Patient Re-evaluated:Patient Re-evaluated prior to inductionOxygen Delivery Method: Circle System Utilized Preoxygenation: Pre-oxygenation with 100% oxygen Intubation Type: IV induction Ventilation: Mask ventilation without difficulty Laryngoscope Size: Mac and 3 Grade View: Grade II Tube type: Oral Tube size: 7.0 mm Number of attempts: 1 Airway Equipment and Method: stylet,  oral airway,  Stylet and LTA kit utilized Placement Confirmation: ETT inserted through vocal cords under direct vision,  positive ETCO2 and breath sounds checked- equal and bilateral Secured at: 22 cm Tube secured with: Tape Dental Injury: Teeth and Oropharynx as per pre-operative assessment

## 2012-08-28 NOTE — Anesthesia Postprocedure Evaluation (Signed)
Anesthesia Post Note  Patient: [name]  Procedure(s) Performed: Procedure(s) (LRB): CYSTOSCOPY WITH INJECTION (N/A)  Anesthesia type: General  Patient location: PACU  Post pain: Pain level controlled  Post assessment: Post-op Vital signs reviewed  Last Vitals: BP 101/58  Pulse 97  Temp 36.3 C  Resp 16  Ht 5\' 5"  (1.651 m)  Wt 207 lb 5 oz (94.036 kg)  BMI 34.50 kg/m2  SpO2 93%  Post vital signs: Reviewed  Level of consciousness: sedated  Complications: No apparent anesthesia complications

## 2012-08-28 NOTE — Interval H&P Note (Signed)
History and Physical Interval Note:  08/28/2012 1:32 PM  [name]  has presented today for surgery, with the diagnosis of stress incontinence  The various methods of treatment have been discussed with the patient and family. After consideration of risks, benefits and other options for treatment, the patient has consented to  Procedure(s) (LRB) with comments: CYSTOSCOPY WITH INJECTION (N/A) - cysto and macroplastique  as a surgical intervention .  The patient's history has been reviewed, patient examined, no change in status, stable for surgery.  I have reviewed the patient's chart and labs.  Questions were answered to the patient's satisfaction.     Renata Gambino A

## 2012-08-29 ENCOUNTER — Encounter (HOSPITAL_BASED_OUTPATIENT_CLINIC_OR_DEPARTMENT_OTHER): Payer: Self-pay | Admitting: Urology

## 2012-09-10 ENCOUNTER — Ambulatory Visit: Payer: Medicare Other | Admitting: *Deleted

## 2012-12-28 ENCOUNTER — Other Ambulatory Visit: Payer: Self-pay | Admitting: Urology

## 2013-01-22 ENCOUNTER — Encounter (HOSPITAL_BASED_OUTPATIENT_CLINIC_OR_DEPARTMENT_OTHER): Payer: Self-pay | Admitting: *Deleted

## 2013-01-22 NOTE — Progress Notes (Signed)
Pt  Instructed npo p mn 1/29.  Pt to use inhaler and bring it with her.  To [facility] 1/30 @ 0600. Needs istat, ekg on arrival.  Pt states she hasn't taken any of her morning meds x 42months , including insulin, victoza, lisinopril, diuretics. ( see med list) . Pt states her physician is unaware. Last office visit was 01/11/13 per pt.  Last A1C was 13 per pt. Last office note, labs, current ekg requested from Dr. Jeneen Rinks Little's office.  Dr. Kalman Shan informed of  Interview. Received order to cancel case. Pt to return to medical dr. For better control.

## 2013-01-22 NOTE — Progress Notes (Signed)
Labs and notes received.  Faxed to [facility].  Case cancelled.

## 2013-01-22 NOTE — Progress Notes (Signed)
Received information from Dr. Eddie Dibbles office.  No labs done. Pt followed by Dr. Dwyane Dee. Last office note and most recent labs requested from Dr. Ronnie Derby office.

## 2013-01-24 ENCOUNTER — Encounter (HOSPITAL_BASED_OUTPATIENT_CLINIC_OR_DEPARTMENT_OTHER): Admission: RE | Payer: Self-pay | Source: Ambulatory Visit

## 2013-01-24 ENCOUNTER — Ambulatory Visit (HOSPITAL_BASED_OUTPATIENT_CLINIC_OR_DEPARTMENT_OTHER): Admission: RE | Admit: 2013-01-24 | Payer: Medicare Other | Source: Ambulatory Visit | Admitting: Urology

## 2013-01-24 SURGERY — CYSTOSCOPY
Anesthesia: General

## 2013-02-14 ENCOUNTER — Ambulatory Visit: Payer: Medicare Other

## 2013-02-14 ENCOUNTER — Ambulatory Visit (INDEPENDENT_AMBULATORY_CARE_PROVIDER_SITE_OTHER): Payer: Medicare Other | Admitting: Family Medicine

## 2013-02-14 VITALS — BP 165/109 | HR 107 | Temp 97.8°F | Resp 16 | Ht 65.0 in | Wt 194.0 lb

## 2013-02-14 DIAGNOSIS — R0781 Pleurodynia: Secondary | ICD-10-CM

## 2013-02-14 DIAGNOSIS — M25512 Pain in left shoulder: Secondary | ICD-10-CM

## 2013-02-14 DIAGNOSIS — M899 Disorder of bone, unspecified: Secondary | ICD-10-CM

## 2013-02-14 DIAGNOSIS — R079 Chest pain, unspecified: Secondary | ICD-10-CM

## 2013-02-14 MED ORDER — LIDOCAINE 5 % EX PTCH: 1.0000 | MEDICATED_PATCH | CUTANEOUS | Status: DC

## 2013-02-14 NOTE — Progress Notes (Signed)
[name] is a 47 y.o. female who presents to Urgent Care today with complaints of Left rib and clavicle pain:  1.  Fall:  Pt states she has fallen twice while at home in past week.  First time was during snow last week, landed on ground on Left side.  States "I cannot remember" mechanism of fall, nor when/if she landed on her clavicle.  Has chronic back, pelvic, foot, knee, neck pain.  Takes "Percocet 5 three pills three times a day" plus Oxycodone 10 two pills three times a day."  Not recorded in her medications.  States these meds are not helping.    No dyspnea/pleuritic pain/chest pain/pain on deep inspiration/SOB.    PMH reviewed.  Past Medical History  Diagnosis Date  . Hypertension   . Arthritis   . Mental disorder     bipolar  . Anxiety   . Sleep apnea     didn't tolerate cpap  . Hyperlipemia   . GERD (gastroesophageal reflux disease)   . Depression   . Insomnia   . SUI (stress urinary incontinence, female) S/P SLING 12-29-2011  . SOB (shortness of breath) on exertion   . Diabetes mellitus    Past Surgical History  Procedure Laterality Date  . Knee arthroscopy w/ allograft impant      LEFT KNEE  . Foot surgery      bilateral  . Anterior cervical decomp/discectomy fusion  2000    C6 - 7  . Pubovaginal sling  12/29/2011    Procedure: Gaynelle Arabian;  Surgeon: Reece Packer, MD;  Location: [facility];  Service: Urology;  Laterality: N/A;  cysto and sparc sling   . Lumbar fusion    . Total abdominal hysterectomy w/ bilateral salpingoophorectomy  1997  . Multiple laparoscopies for endometriosis    . Cesarean section      X2  . Repeat reconstruction acl left knee/ screws removed  03-28-2000    CADAVER GRAFT  . Reconsturction of congenital uterus anomaly  AGE 58  . Knee surgery      TOTAL 5 SURG'S  . Cardiac catheterization  05-22-2008   DR SKAINS    NO SIGNIFECANT CAD/ NORMAL LV/  EF 65%/  NO WALL MOTION ABNORMALITIES  . Cystoscopy with  injection  05/04/2012    Procedure: CYSTOSCOPY WITH INJECTION;  Surgeon: Reece Packer, MD;  Location: [facility];  Service: Urology;  Laterality: N/A;  MACROPLASTIQUE INJECTION  . Cystoscopy with injection  08/28/2012    Procedure: CYSTOSCOPY WITH INJECTION;  Surgeon: Reece Packer, MD;  Location: [facility];  Service: Urology;  Laterality: N/A;  cysto and macroplastique     Medications reviewed. Current Outpatient Prescriptions  Medication Sig Dispense Refill  . alprazolam (XANAX) 2 MG tablet Take 2 mg by mouth 2 (two) times daily as needed.       [name] amphetamine-dextroamphetamine (ADDERALL) 20 MG tablet Take 20 mg by mouth 2 (two) times daily.       . clonazePAM (KLONOPIN) 2 MG tablet Take 2 mg by mouth at bedtime as needed.       . diazepam (VALIUM) 10 MG tablet Take 10 mg by mouth at bedtime. 2 tabs @@ hs      . doxepin (SINEQUAN) 100 MG capsule Take 100 mg by mouth at bedtime as needed.      [name] esomeprazole (NEXIUM) 40 MG capsule Take 40 mg by mouth daily before breakfast.       .  insulin aspart (NOVOLOG) 100 UNIT/ML injection Inject 15 Units into the skin 2 (two) times daily.      . insulin glargine (LANTUS) 100 UNIT/ML injection Inject 25 Units into the skin 3 (three) times daily.       . insulin NPH-insulin regular (NOVOLIN 70/30) (70-30) 100 UNIT/ML injection Inject 25 Units into the skin 2 (two) times daily with a meal.      . mupirocin ointment (BACTROBAN) 2 % Apply 1 application topically daily.      . Oxcarbazepine (TRILEPTAL) 300 MG tablet Take 300 mg by mouth at bedtime. 3 tabs @@ hs      . oxyCODONE-acetaminophen (PERCOCET) 7.5-325 MG per tablet Take 1 tablet by mouth every 4 (four) hours as needed.      . ramelteon (ROZEREM) 8 MG tablet Take 16 mg by mouth at bedtime.       [name] spironolactone (ALDACTONE) 25 MG tablet Take 25 mg by mouth daily.       . ARIPiprazole (ABILIFY) 10 MG tablet Take 10 mg by mouth daily.       . DULoxetine  (CYMBALTA) 60 MG capsule Take 60 mg by mouth 2 (two) times daily.       . furosemide (LASIX) 40 MG tablet Take 40 mg by mouth 2 (two) times daily.       [name] ipratropium (ATROVENT HFA) 17 MCG/ACT inhaler Inhale 2 puffs into the lungs 2 (two) times daily.       [name] lidocaine (LIDODERM) 5 % Place 1 patch onto the skin daily. Remove & Discard patch within 12 hours or as directed by MD  30 patch  0  . Liraglutide (VICTOZA [place]) Inject 18 Units into the skin 3 (three) times daily.       [name] lisinopril (PRINIVIL,ZESTRIL) 10 MG tablet Take 10 mg by mouth daily.        No current facility-administered medications for this visit.    [facility] reading (PRIMARY) by  Dr. Mingo Amber:  No acute rib fractures.  No clavicle dislocation  ROS as above otherwise neg.  No chest pain, palpitations, SOB, Fever, Chills, Abd pain, N/V/D.   Physical Exam:  BP 165/109  Pulse 107  Temp(Src) 97.8 F (36.6 C) (Oral)  Resp 16  Ht 5\' 5"  (1.651 m)  Wt 194 lb (87.998 kg)  BMI 32.28 kg/m2  SpO2 99% Gen:  Alert, cooperative patient who appears stated age in no acute distress.  Vital signs reviewed.  Sitting comfortably in chair.  Does stumble when she walks, appears to be under influence of alcohol or narcotics.   HEENT: EOMI, sluggishly reactive pupils.  MMM Pulm:  Clear to auscultation bilaterally with good air movement.  No wheezes or rales noted.   Cardiac:  Regular rate and rhythm without murmur auscultated.  Good S1/S2. MSK:  Clavicles NT along entire length BL except where Left [place] joint.  No step-off, no crepitus.  Left ribs NT except for deep palpation.  No step-off, no bruising noted.   Skin:  No bruising noted entire aspect of arms, ribs, upper back, neck.  Multiple tattoos present.   Exts: Non edematous BL  LE, warm and well perfused.   Assessment and Plan:  1.  Pain s/p falls:  Negative x-rays.  Relatively benign physical exam.  Pt did not seem to be in as much pain as she describes.  Did discuss with her that she seems to  be on too many narcotics which may be contributing to falls, but she  did not seem open to this.  Prescribe Lidocaine patch to place on ribs for pain, discussed 2-4 week healing time for rib contusion.  She did not drive here.

## 2013-02-14 NOTE — Patient Instructions (Signed)
Use the pain patch on the spot where it hurts.   If the radiologist sees something different we'll give you a call.  I hope you feel better

## 2013-02-15 NOTE — Progress Notes (Signed)
I have read, reviewed, and agree with plan of care by Dr. Walden  

## 2013-02-21 ENCOUNTER — Emergency Department (HOSPITAL_COMMUNITY)
Admission: EM | Admit: 2013-02-21 | Discharge: 2013-02-22 | Disposition: A | Discharge status: Home / Self Care | Payer: Medicare Other | Attending: Emergency Medicine | Admitting: Emergency Medicine

## 2013-02-21 ENCOUNTER — Encounter (HOSPITAL_COMMUNITY): Payer: Self-pay | Admitting: *Deleted

## 2013-02-21 ENCOUNTER — Emergency Department (HOSPITAL_COMMUNITY): Payer: Medicare Other

## 2013-02-21 DIAGNOSIS — Y929 Unspecified place or not applicable: Secondary | ICD-10-CM | POA: Insufficient documentation

## 2013-02-21 DIAGNOSIS — S298XXA Other specified injuries of thorax, initial encounter: Secondary | ICD-10-CM | POA: Insufficient documentation

## 2013-02-21 DIAGNOSIS — E119 Type 2 diabetes mellitus without complications: Secondary | ICD-10-CM | POA: Insufficient documentation

## 2013-02-21 DIAGNOSIS — F319 Bipolar disorder, unspecified: Secondary | ICD-10-CM | POA: Insufficient documentation

## 2013-02-21 DIAGNOSIS — R0789 Other chest pain: Secondary | ICD-10-CM

## 2013-02-21 DIAGNOSIS — E785 Hyperlipidemia, unspecified: Secondary | ICD-10-CM | POA: Insufficient documentation

## 2013-02-21 DIAGNOSIS — Y9329 Activity, other involving ice and snow: Secondary | ICD-10-CM | POA: Insufficient documentation

## 2013-02-21 DIAGNOSIS — Z8742 Personal history of other diseases of the female genital tract: Secondary | ICD-10-CM | POA: Insufficient documentation

## 2013-02-21 DIAGNOSIS — F411 Generalized anxiety disorder: Secondary | ICD-10-CM | POA: Insufficient documentation

## 2013-02-21 DIAGNOSIS — I10 Essential (primary) hypertension: Secondary | ICD-10-CM | POA: Insufficient documentation

## 2013-02-21 DIAGNOSIS — Z8719 Personal history of other diseases of the digestive system: Secondary | ICD-10-CM | POA: Insufficient documentation

## 2013-02-21 DIAGNOSIS — Z794 Long term (current) use of insulin: Secondary | ICD-10-CM | POA: Insufficient documentation

## 2013-02-21 DIAGNOSIS — R296 Repeated falls: Secondary | ICD-10-CM | POA: Insufficient documentation

## 2013-02-21 DIAGNOSIS — G4700 Insomnia, unspecified: Secondary | ICD-10-CM | POA: Insufficient documentation

## 2013-02-21 DIAGNOSIS — Z8739 Personal history of other diseases of the musculoskeletal system and connective tissue: Secondary | ICD-10-CM | POA: Insufficient documentation

## 2013-02-21 DIAGNOSIS — Z87891 Personal history of nicotine dependence: Secondary | ICD-10-CM | POA: Insufficient documentation

## 2013-02-21 DIAGNOSIS — Z79899 Other long term (current) drug therapy: Secondary | ICD-10-CM | POA: Insufficient documentation

## 2013-02-21 NOTE — ED Notes (Signed)
Pt c/o left sided rib pain; front rib pain; fell several times in ice few wks ago; shortness of breath when walking

## 2013-02-22 MED ORDER — ACETAMINOPHEN 500 MG PO TABS: 500.0000 mg | ORAL_TABLET | Freq: Four times a day (QID) | ORAL | Status: DC | PRN

## 2013-02-22 MED ORDER — OXYCODONE-ACETAMINOPHEN 5-325 MG PO TABS: 1.0000 | ORAL_TABLET | Freq: Once | ORAL | Status: DC

## 2013-02-22 NOTE — ED Notes (Signed)
Patient is driving herself home and does not have anyone to come and get her.Notified EDP of the same
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
## 2013-02-22 NOTE — ED Notes (Signed)
Patient reports that she has an insulin pump on and has not eaten all day. CBG 94 at this time

## 2013-03-25 ENCOUNTER — Other Ambulatory Visit: Payer: Self-pay | Admitting: Urology

## 2013-04-14 ENCOUNTER — Ambulatory Visit (INDEPENDENT_AMBULATORY_CARE_PROVIDER_SITE_OTHER): Payer: Medicaid Other | Admitting: Emergency Medicine

## 2013-04-14 VITALS — BP 145/83 | HR 97 | Temp 98.1°F | Resp 18 | Ht 65.0 in | Wt 207.4 lb

## 2013-04-14 DIAGNOSIS — L03012 Cellulitis of left finger: Secondary | ICD-10-CM

## 2013-04-14 DIAGNOSIS — IMO0002 Reserved for concepts with insufficient information to code with codable children: Secondary | ICD-10-CM

## 2013-04-14 MED ORDER — SULFAMETHOXAZOLE-TRIMETHOPRIM 800-160 MG PO TABS: 1.0000 | ORAL_TABLET | Freq: Two times a day (BID) | ORAL | Status: DC

## 2013-04-14 NOTE — Patient Instructions (Addendum)
Paronychia  Paronychia is an inflammatory reaction involving the folds of the skin surrounding the fingernail. This is commonly caused by an infection in the skin around a nail. The most common cause of paronychia is frequent wetting of the hands (as seen with bartenders, food servers, nurses or others who wet their hands). This makes the skin around the fingernail susceptible to infection by bacteria (germs) or fungus. Other predisposing factors are:   Aggressive manicuring.   Nail biting.   Thumb sucking.  The most common cause is a staphylococcal (a type of germ) infection, or a fungal (Candida) infection. When caused by a germ, it usually comes on suddenly with redness, swelling, pus and is often painful. It may get under the nail and form an abscess (collection of pus), or form an abscess around the nail. If the nail itself is infected with a fungus, the treatment is usually prolonged and may require oral medicine for up to one year. Your caregiver will determine the length of time treatment is required. The paronychia caused by bacteria (germs) may largely be avoided by not pulling on hangnails or picking at cuticles. When the infection occurs at the tips of the finger it is called felon. When the cause of paronychia is from the herpes simplex virus (HSV) it is called herpetic whitlow.  TREATMENT   When an abscess is present treatment is often incision and drainage. This means that the abscess must be cut open so the pus can get out. When this is done, the following home care instructions should be followed.  HOME CARE INSTRUCTIONS    It is important to keep the affected fingers very dry. Rubber or plastic gloves over cotton gloves should be used whenever the hand must be placed in water.   Keep wound clean, dry and dressed as suggested by your caregiver between warm soaks or warm compresses.   Soak in warm water for fifteen to twenty minutes three to four times per day for bacterial infections. Fungal  infections are very difficult to treat, so often require treatment for long periods of time.   For bacterial (germ) infections take antibiotics (medicine which kill germs) as directed and finish the prescription, even if the problem appears to be solved before the medicine is gone.   Only take over-the-counter or prescription medicines for pain, discomfort, or fever as directed by your caregiver.  SEEK IMMEDIATE MEDICAL CARE IF:   You have redness, swelling, or increasing pain in the wound.   You notice pus coming from the wound.   You have a fever.   You notice a bad smell coming from the wound or dressing.  Document Released: 06/07/2001 Document Revised: 03/05/2012 Document Reviewed: 02/06/2009  [employer] Patient Information 2013 [employer].

## 2013-04-14 NOTE — Progress Notes (Signed)
[facility] [address], [place] [address] [phone]  Date:  04/14/2013   Name:  [name]   DOB:  12/21/1966   MRN:  [id number]  PCP:  No primary provider on file.    Chief Complaint: Finger Injury   History of Present Illness:  [name] is a 47 y.o. very pleasant female patient who presents with the following:  Has blisters on tip of second and third right fingers on flexor pad.  No history of burns or overuse.  Cleaning up a shop and developed blisters following.  Has pustule on chest wall under left breast and on left second finger .  No fever or chills.  No improvement with over the counter medications or other home remedies. Denies other complaint or health concern today.   There is no problem list on file for this patient.   Past Medical History  Diagnosis Date  . Hypertension   . Arthritis   . Mental disorder     bipolar  . Anxiety   . Sleep apnea     didn't tolerate cpap  . Hyperlipemia   . GERD (gastroesophageal reflux disease)   . Depression   . Insomnia   . SUI (stress urinary incontinence, female) S/P SLING 12-29-2011  . SOB (shortness of breath) on exertion   . Diabetes mellitus   . Neuromuscular disorder   . Blood transfusion without reported diagnosis     Past Surgical History  Procedure Laterality Date  . Knee arthroscopy w/ allograft impant      LEFT KNEE  . Foot surgery      bilateral  . Anterior cervical decomp/discectomy fusion  2000    C6 - 7  . Pubovaginal sling  12/29/2011    Procedure: Gaynelle Arabian;  Surgeon: Reece Packer, MD;  Location: [facility];  Service: Urology;  Laterality: N/A;  cysto and sparc sling   . Lumbar fusion    . Total abdominal hysterectomy w/ bilateral salpingoophorectomy  1997  . Multiple laparoscopies for endometriosis    . Cesarean section      X2  . Repeat reconstruction acl left knee/ screws removed  03-28-2000    CADAVER GRAFT  .  Reconsturction of congenital uterus anomaly  AGE 20  . Knee surgery      TOTAL 5 SURG'S  . Cardiac catheterization  05-22-2008   DR SKAINS    NO SIGNIFECANT CAD/ NORMAL LV/  EF 65%/  NO WALL MOTION ABNORMALITIES  . Cystoscopy with injection  05/04/2012    Procedure: CYSTOSCOPY WITH INJECTION;  Surgeon: Reece Packer, MD;  Location: [facility];  Service: Urology;  Laterality: N/A;  MACROPLASTIQUE INJECTION  . Cystoscopy with injection  08/28/2012    Procedure: CYSTOSCOPY WITH INJECTION;  Surgeon: Reece Packer, MD;  Location: [facility];  Service: Urology;  Laterality: N/A;  cysto and macroplastique     History  Substance Use Topics  . Smoking status: Former Smoker -- 3.00 packs/day for 35 years    Types: Cigarettes    Quit date: 10/22/2012  . Smokeless tobacco: Never Used  . Alcohol Use: No    No family history on file.  Allergies  Allergen Reactions  . Latex Other (See Comments)    Pt states she cannot use condoms - cause an infection.  Use of latex on skin is okay  . Ibuprofen Other (See Comments)    headaches  . Sweetening  Enhancer (Flavoring Agent) Nausea And Vomiting    And headaches  . Triazolam Other (See Comments)    hallucinations    Medication list has been reviewed and updated.  Current Outpatient Prescriptions on File Prior to Visit  Medication Sig Dispense Refill  . alprazolam (XANAX) 2 MG tablet Take 2 mg by mouth 2 (two) times daily as needed. For anxiety      . amphetamine-dextroamphetamine (ADDERALL) 20 MG tablet Take 20 mg by mouth 2 (two) times daily.      . clonazePAM (KLONOPIN) 2 MG tablet Take 2 mg by mouth at bedtime as needed. For sleep      . diazepam (VALIUM) 10 MG tablet Take 20 mg by mouth at bedtime as needed. For sleep      . doxepin (SINEQUAN) 100 MG capsule Take 100 mg by mouth at bedtime as needed. For anxiety/sleep      . insulin NPH-insulin regular (NOVOLIN 70/30) (70-30) 100 UNIT/ML injection Inject  25 Units into the skin 2 (two) times daily with a meal.      . ipratropium (ATROVENT HFA) 17 MCG/ACT inhaler Inhale 2 puffs into the lungs 2 (two) times daily.       [name] lidocaine (LIDODERM) 5 % Place 1 patch onto the skin daily. Remove & Discard patch within 12 hours or as directed by MD  30 patch  0  . Oxcarbazepine (TRILEPTAL) 300 MG tablet Take 900 mg by mouth at bedtime.       [name] oxyCODONE-acetaminophen (PERCOCET) 7.5-325 MG per tablet Take 1 tablet by mouth every 4 (four) hours as needed. For pain      . potassium chloride SA (K-DUR,KLOR-CON) 20 MEQ tablet Take 20 mEq by mouth daily.      . ramelteon (ROZEREM) 8 MG tablet Take 16 mg by mouth at bedtime.       [name] spironolactone (ALDACTONE) 25 MG tablet Take 25 mg by mouth daily.      [name] acetaminophen (TYLENOL) 500 MG tablet Take 1 tablet (500 mg total) by mouth every 6 (six) hours as needed for pain.  30 tablet  0   No current facility-administered medications on file prior to visit.    Review of Systems:  As per HPI, otherwise negative.    Physical Examination: Filed Vitals:   04/14/13 1420  BP: 145/83  Pulse: 97  Temp: 98.1 F (36.7 C)  Resp: 18   Filed Vitals:   04/14/13 1420  Height: 5\' 5"  (1.651 m)  Weight: 207 lb 6.4 oz (94.076 kg)   Body mass index is 34.51 kg/(m^2). Ideal Body Weight: Weight in (lb) to have BMI = 25: 149.9   GEN: WDWN, NAD, Non-toxic, Alert & Oriented x 3 HEENT: Atraumatic, Normocephalic.  Ears and Nose: No external deformity. EXTR: No clubbing/cyanosis/edema NEURO: Normal gait.  PSYCH: Normally interactive. Conversant. Not depressed or anxious appearing.  Calm demeanor.  Blisters left hand and paronychia left second finger.   Pustule on left chest wall   Assessment and Plan: Blisters were drained of clear fluid Paronychia Septra  Signed,  Ellison Carwin, MD

## 2013-04-15 NOTE — Progress Notes (Signed)
Need orders for 5-6 surgery pre op is 4-29 200 pm

## 2013-04-16 ENCOUNTER — Encounter (HOSPITAL_COMMUNITY): Payer: Self-pay | Admitting: Pharmacy Technician

## 2013-04-17 NOTE — Progress Notes (Signed)
Need PRE OP ORDERS PO:[id number]   Has PST APPT 04/23/13  THANKS

## 2013-04-23 ENCOUNTER — Inpatient Hospital Stay (HOSPITAL_COMMUNITY)
Admission: RE | Admit: 2013-04-23 | Discharge: 2013-04-23 | Disposition: A | Discharge status: Home / Self Care | Payer: Medicare Other | Source: Ambulatory Visit

## 2013-04-23 ENCOUNTER — Other Ambulatory Visit: Payer: Self-pay | Admitting: Urology

## 2013-04-23 ENCOUNTER — Other Ambulatory Visit (HOSPITAL_COMMUNITY): Payer: Self-pay | Admitting: *Deleted

## 2013-04-23 NOTE — Pre-Procedure Instructions (Signed)
Note about Insulin Pump from Dr. Elayne Snare on chart.

## 2013-04-23 NOTE — Pre-Procedure Instructions (Signed)
Spoke with the Diabetic Co-ordinator, Lorenda Peck, RN about this patient on an Insulin pump and the note from Dr. Etheleen Mayhew . She states that Anesthesia should just leave the pump on like his note states and check glucose 20-30 minutes after surgery begins.

## 2013-04-25 HISTORY — PX: CARPAL TUNNEL RELEASE: SHX101

## 2013-04-26 ENCOUNTER — Inpatient Hospital Stay (HOSPITAL_COMMUNITY): Admission: RE | Admit: 2013-04-26 | Payer: Medicare Other | Source: Ambulatory Visit

## 2013-04-26 ENCOUNTER — Encounter (HOSPITAL_COMMUNITY): Payer: Self-pay | Admitting: Pharmacy Technician

## 2013-04-29 ENCOUNTER — Emergency Department (HOSPITAL_COMMUNITY): Payer: Medicare Other

## 2013-04-29 ENCOUNTER — Encounter (HOSPITAL_COMMUNITY)
Admission: RE | Admit: 2013-04-29 | Discharge: 2013-04-29 | Disposition: A | Discharge status: Home / Self Care | Payer: Medicare Other | Source: Ambulatory Visit | Attending: Urology | Admitting: Urology

## 2013-04-29 ENCOUNTER — Encounter (HOSPITAL_COMMUNITY): Payer: Self-pay

## 2013-04-29 ENCOUNTER — Encounter (HOSPITAL_COMMUNITY): Payer: Self-pay | Admitting: Emergency Medicine

## 2013-04-29 ENCOUNTER — Emergency Department (HOSPITAL_COMMUNITY)
Admission: EM | Admit: 2013-04-29 | Discharge: 2013-04-29 | Disposition: A | Discharge status: Home / Self Care | Payer: Medicare Other | Attending: Emergency Medicine | Admitting: Emergency Medicine

## 2013-04-29 DIAGNOSIS — E119 Type 2 diabetes mellitus without complications: Secondary | ICD-10-CM | POA: Insufficient documentation

## 2013-04-29 DIAGNOSIS — Z862 Personal history of diseases of the blood and blood-forming organs and certain disorders involving the immune mechanism: Secondary | ICD-10-CM | POA: Insufficient documentation

## 2013-04-29 DIAGNOSIS — F411 Generalized anxiety disorder: Secondary | ICD-10-CM | POA: Insufficient documentation

## 2013-04-29 DIAGNOSIS — Z87891 Personal history of nicotine dependence: Secondary | ICD-10-CM | POA: Insufficient documentation

## 2013-04-29 DIAGNOSIS — Y9389 Activity, other specified: Secondary | ICD-10-CM | POA: Insufficient documentation

## 2013-04-29 DIAGNOSIS — Z8659 Personal history of other mental and behavioral disorders: Secondary | ICD-10-CM | POA: Insufficient documentation

## 2013-04-29 DIAGNOSIS — Y9210 Unspecified residential institution as the place of occurrence of the external cause: Secondary | ICD-10-CM | POA: Insufficient documentation

## 2013-04-29 DIAGNOSIS — K219 Gastro-esophageal reflux disease without esophagitis: Secondary | ICD-10-CM | POA: Insufficient documentation

## 2013-04-29 DIAGNOSIS — Z91040 Latex allergy status: Secondary | ICD-10-CM | POA: Insufficient documentation

## 2013-04-29 DIAGNOSIS — Z8639 Personal history of other endocrine, nutritional and metabolic disease: Secondary | ICD-10-CM | POA: Insufficient documentation

## 2013-04-29 DIAGNOSIS — G4730 Sleep apnea, unspecified: Secondary | ICD-10-CM | POA: Insufficient documentation

## 2013-04-29 DIAGNOSIS — M533 Sacrococcygeal disorders, not elsewhere classified: Secondary | ICD-10-CM

## 2013-04-29 DIAGNOSIS — R296 Repeated falls: Secondary | ICD-10-CM | POA: Insufficient documentation

## 2013-04-29 DIAGNOSIS — IMO0002 Reserved for concepts with insufficient information to code with codable children: Secondary | ICD-10-CM | POA: Insufficient documentation

## 2013-04-29 DIAGNOSIS — Z79899 Other long term (current) drug therapy: Secondary | ICD-10-CM | POA: Insufficient documentation

## 2013-04-29 DIAGNOSIS — F319 Bipolar disorder, unspecified: Secondary | ICD-10-CM | POA: Insufficient documentation

## 2013-04-29 DIAGNOSIS — Z794 Long term (current) use of insulin: Secondary | ICD-10-CM | POA: Insufficient documentation

## 2013-04-29 DIAGNOSIS — Z8709 Personal history of other diseases of the respiratory system: Secondary | ICD-10-CM | POA: Insufficient documentation

## 2013-04-29 DIAGNOSIS — M129 Arthropathy, unspecified: Secondary | ICD-10-CM | POA: Insufficient documentation

## 2013-04-29 DIAGNOSIS — I10 Essential (primary) hypertension: Secondary | ICD-10-CM | POA: Insufficient documentation

## 2013-04-29 DIAGNOSIS — Z8742 Personal history of other diseases of the female genital tract: Secondary | ICD-10-CM | POA: Insufficient documentation

## 2013-04-29 DIAGNOSIS — G4700 Insomnia, unspecified: Secondary | ICD-10-CM | POA: Insufficient documentation

## 2013-04-29 LAB — CBC
HCT: 33.5 % — ABNORMAL LOW (ref 36.0–46.0)
Hemoglobin: 12.1 g/dL (ref 12.0–15.0)
MCHC: 36.1 g/dL — ABNORMAL HIGH (ref 30.0–36.0)
MCV: 86.6 fL (ref 78.0–100.0)
RDW: 12.5 % (ref 11.5–15.5)
WBC: 10.1 10*3/uL (ref 4.0–10.5)

## 2013-04-29 LAB — BASIC METABOLIC PANEL
BUN: 4 mg/dL — ABNORMAL LOW (ref 6–23)
CO2: 28 mEq/L (ref 19–32)
Chloride: 95 mEq/L — ABNORMAL LOW (ref 96–112)
Creatinine, Ser: 0.56 mg/dL (ref 0.50–1.10)

## 2013-04-29 MED ORDER — DEXTROSE 5 % IV SOLN
290.0000 mg | INTRAVENOUS | Status: AC
  Administered 2013-04-30: 290 mg via INTRAVENOUS
  Filled 2013-04-29: qty 7.25

## 2013-04-29 NOTE — H&P (Signed)
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 She failed a sling. She really thinks the 2nd Macroplastique did help a lot. She only wears 1 to 2 pads a day that are damp. She takes a long time to void and she is straining. She takes a long time to empty.   Two weeks ago she felt uncomfortable in the vaginal area and she felt inflamed. Her gynecologist gave her some cream. She has had no discharge or blood.   She was down today because one of her friends died a few days ago in a motor vehicle accident and her uncle died 3 weeks ago.   Her gynecologist wanted her checked for any exposed mesh.   There is no other modifying factors or associated signs or symptoms. There is no other aggravating or relieving factors. Her symptoms are mild to moderate in severity and persistent.   Urinalysis: Positive bacteria, rare yeast. Urine sent for culture.   On examination, [name] labia looked inflamed and reddened, but they were nontender. Her bladder neck was well supported, but she obviously had exposed mesh for approximately 1-1/2 cm underneath the urethra, extending to her right side. There was no bloody discharge. I felt I could see almost the entire wig of the sling. It is almost as if her tissues had dehisced.   [name] has not been sexually active recently.    Past Medical History Problems  1. History of  Adult Sleep Apnea 780.57 2. History of  Anxiety (Symptom) 300.00 3. History of  Arthritis V13.4 4. History of  Depression 311 5. History of  Diabetes Mellitus 250.00 6. History of  Heartburn 787.1 7. History of  Hypercholesterolemia 272.0 8. History of  Hypertension 401.9  Surgical History Problems  1. History of  Endoscopic Injection Of Implant Material Into Bladder Neck 2. History of  Endoscopic Injection Of Implant Material Into Bladder Neck 3. History of  Endoscopic Injection Of Implant  Material Into Bladder Neck 4. History of  Foot Surgery 5. History of  Hernia Repair 6. History of  Knee Surgery 7. History of  Neck Surgery 8. History of  Shoulder Surgery 9. History of  Shoulder Surgery Right 10. History of  Spine Repair 11. History of  Vaginal Sling Operation For Stress Incontinence  Current Meds 1. Abilify 20 MG Oral Tablet; TAKE 1 TABLET DAILY; Therapy: (Recorded:22Mar2012) to 2. ALPRAZolam 2 MG Oral Tablet; 2 at bedtime; Therapy: (Recorded:22Mar2012) to 3. Amphetamine-Dextroamphetamine 20 MG Oral Tablet; 2 tablets daily; Therapy: [id number] to 4. Atrovent HFA 17 MCG/ACT Inhalation Aerosol Solution; Therapy: [id number] to 5. BD Pen Needle Short U/F 31G X 8 MM Miscellaneous; Therapy: 11Oct2012 to 6. Cymbalta 60 MG Oral Capsule Delayed Release Particles; 60mg ; 2 tablets daily; Therapy:  (Recorded:22Mar2012) to 7. Doxepin HCl 100 MG Oral Capsule; 2 capsules at bedtime; Therapy: [id number] to 8. Fenofibrate 160 MG Oral Tablet; TAKE 1 TABLET DAILY; Therapy: (Recorded:22Mar2012) to 9. Furosemide 40 MG Oral Tablet; Therapy: [id number] to 10. KlonoPIN TABS; 2 tablets at night; Therapy: (Recorded:22Mar2012) to 11. Lantus 100 UNIT/ML Subcutaneous Solution; 44u; inject 1 x daily; Therapy: [id number] to 12. Lisinopril 10 MG Oral Tablet; 10mg ; 1 tablet daily; Therapy: (Recorded:22Mar2012) to 50. MetFORMIN HCl ER 750 MG Oral Tablet Extended Release 24 Hour; 750mg ; 3 tablets daily;   Therapy: (Recorded:22Mar2012) to 14. Naproxen Sodium 550 MG Oral Tablet; 1 tablet as needed; Therapy: 30Jun2011 to 15. NexIUM 40 MG Oral  Capsule Delayed Release; 1 daily; Therapy: (Recorded:22Mar2012) to 16. NovoLIN 70/30 (70-30) 100 UNIT/ML Subcutaneous Suspension; Therapy: [id number] to 17. NovoLOG FlexPen 100 UNIT/ML Subcutaneous Solution; Therapy: 24Apr2013 to 18. OXcarbazepine 300 MG Oral Tablet; Therapy: [id number] to 19. OxyCODONE HCl 5 MG Oral Tablet; Therapy: [id number] to 20.  Oxycodone-Acetaminophen 7.5-325 MG Oral Tablet; Therapy: [id number] to 21. Pristiq 50 MG Oral Tablet Extended Release 24 Hour; Therapy: [phone] to 22. ProAir HFA 108 (90 Base) MCG/ACT Inhalation Aerosol Solution; Therapy: 20Jan2012 to 23. Rozerem 8 MG Oral Tablet; TAKE 2 TABLETS AT BEDTIME; Therapy: (Recorded:22Mar2012) to 24. Spironolactone 25 MG Oral Tablet; Therapy: [id number] to 25. Tamsulosin HCl 0.4 MG Oral Capsule; TAKE 1 CAPSULE EVERY DAY; Therapy: [id number] to   (Evaluate:01Mar2012); Last [id number]. Victoza 18 MG/3ML Subcutaneous Solution; inject 1xdaily; Therapy: 06Sep2011 to  Allergies Medication  1. Ibuprofen CAPS 2. Trazodone and Deriv  Family History Problems  1. Maternal history of  Death In The Family Mother age 42;lymphoma cancer 2. Family history of  Family Health Status Number Of Children 1 son; 1 daughter 3. Paternal history of  Kidney Cancer V16.51  Social History Problems  1. Alcohol Use occ. 2. Caffeine Use 3-4 a day 3. Marital History - Divorced 4. Tobacco Use V15.82 3-4 pks a day since age 17 Denied  24. History of  Occupation:  Results/Data  Urine [Data Includes: Last 1 Day]   [id number]  COLOR YELLOW   APPEARANCE CLOUDY   SPECIFIC GRAVITY <1.005   pH 6.5   GLUCOSE > 1000 mg/dL  BILIRUBIN NEG   KETONE NEG mg/dL  BLOOD SMALL   PROTEIN NEG mg/dL  UROBILINOGEN 0.2 mg/dL  NITRITE NEG   LEUKOCYTE ESTERASE MOD   SQUAMOUS EPITHELIAL/HPF FEW   WBC TNTC WBC/hpf  RBC 0-2 RBC/hpf  BACTERIA MODERATE   CRYSTALS NONE SEEN   CASTS NONE SEEN   Other RARE YEAST    Assessment Assessed  1. Female Stress Incontinence 625.6 2. Vaginal Pain 625.8  Plan   Discussion/Summary   [name] is having vaginal discomfort. She has had a dramatic improvement from her Macroplastique in September 2013.   [name] may have had a recent episode of vaginitis, possibly related to the exposed sling. I drew her a picture. I did not perform cystoscopy, and  it is very doubtful that she has any erosion in the urethra based upon today's findings. I wonder if her tissues may have broken down from a vaginitis, or really from all of the heavy straining she does.   I have recommended removal of the suburethral component of the sling. There is no question it could worsen her stress incontinence, but it may not since it did not help in the past, and she has had Macroplastique I went over risks in detail including infection, bleeding, pain, injury to other structures with sequelae. We talked about rare risks such as fistula or bladder or urethral injury requiring further surgery. I would cystoscope at the time to rule out a urethral erosion. I do not think the exposure is related the Macroplastique treatment, but theoretically it could be.   The more I thought about things I thought it was best to cystoscope [name]. She did not have blood in the urine, but I wanted to make certain she did not have urethral erosion or a bladder erosion.   Cystoscopy: Today the patient underwent cystoscopy after discussing pros, cons and risks. The procedure was performed to assess the bladder/urethra and to rule out an intravesical cause  of their symptoms. A well lubricated, sterile cystoscope was utilized and gently inserted into the urethra. The bladder mucosa and trigone were normal. There was no stitch, foreign body, or carcinoma. There was clear urine effluxing from both ureters. The procedure was well tolerated. She had no sling in the bladder. She had a long healthy urethra with no erosion.   In addition to reviewing risks, I placed [name] on 3 days of Diflucan because of the urinalysis. We also have _____ failure rate from the procedure and reexposure rates. She tolerates cystoscopy well. In my opinion, this erosion is new since it was only a few months ago. I examined her under anesthesia and the findings were normal. Her degree of exposure today was  significant.   [name] has not had intercourse recently. She understands that it is difficult to say whether or not her vaginitis was a result of the exposure or potentially the cause of the exposure. I am more inclined to think it is from her heavy straining.   After a thorough review of the management options for the patient's condition the patient  elected to proceed with surgical therapy as noted above. We have discussed the potential benefits and risks of the procedure, side effects of the proposed treatment, the likelihood of the patient achieving the goals of the procedure, and any potential problems that might occur during the procedure or recuperation. Informed consent has been obtained.

## 2013-04-29 NOTE — Progress Notes (Signed)
  Spoke with [name] diabetes corrdinator aware of pts surgery 04-30-2013, said to place note on chart if pump d/ced for any reason insulin drip must be started right away.note placed on pt chart

## 2013-04-29 NOTE — Progress Notes (Addendum)
Patent signed patient contract for insulin pump therapy and placed on pt chart. , note from dr Dwyane Dee concerning leaving insulin pump on for surgery and cannot de discontinued and medical clearnace note placed on pt chart.  Left ribs and chest 1 view xray 02-14-2013 epic

## 2013-04-29 NOTE — Patient Instructions (Addendum)
19 [name]  04/29/2013   Your procedure is scheduled on: 04-30-2013  Report to [facility] at [place] AM.  Call this number if you have problems the morning of surgery [phone]   Remember:   Do not eat food or drink liquids :After Midnight.     Take these medicines the morning of surgery with A SIP OF WATER: nexium, oxycodone                                SEE [place] PREPARING FOR SURGERY SHEET   Do not wear jewelry, make-up or nail polish.  Do not wear lotions, powders, or perfumes. You may wear deodorant.   Men may shave face and neck.  Do not bring valuables to the hospital.  Contacts, dentures or bridgework may not be worn into surgery.  Leave suitcase in the car. After surgery it may be brought to your room.  For patients admitted to the hospital, checkout time is 11:00 AM the day of discharge.   Patients discharged the day of surgery will not be allowed to drive home.  Name and phone number of your driver:[name] aunt, not sure number will be here   Special Instructions: N/A   Please read over the following fact sheets that you were given: MRSA Information.  Call Zelphia Cairo RN pre op nurse if needed [phone]    FAILURE TO FOLLOW THESE INSTRUCTIONS MAY RESULT IN THE CANCELLATION OF YOUR SURGERY. PATIENT SIGNATURE___________________________________________

## 2013-04-29 NOTE — ED Provider Notes (Signed)
History     CSN: [id number]  Arrival date & time 04/29/13  1100   First MD Initiated Contact with Patient 04/29/13 1142      Chief Complaint  Patient presents with  . Tailbone Pain    (Consider location/radiation/quality/duration/timing/severity/associated sxs/prior treatment) HPI Comments: The patient is a 47 year old female who presents with pain in her tailbone after falling at her doctor's office trying to use the toilet. She states it takes her a long time to get on and off the toilet and he automatically sensors turned off. She was then in the dark trying to get off of the toilet and "did not have her bearings" and so she fell and landed on her sacral area. She states she is in severe pain. This happened a few hours ago. She is a poor historian and very somnolent. When asked about the amount of pain medication she has taken today she states, "I have not taken any pain medicine, I just have not had a lot of sleep". She denies history of cancer, ivda, bowel or bladder incontinence.   The history is provided by the patient. No language interpreter was used.    Past Medical History  Diagnosis Date  . Hypertension   . Arthritis   . Anxiety   . Sleep apnea     didn't tolerate cpap  . Hyperlipemia   . GERD (gastroesophageal reflux disease)   . Insomnia   . SUI (stress urinary incontinence, female) S/P SLING 12-29-2011  . SOB (shortness of breath) on exertion   . Diabetes mellitus   . Blood transfusion without reported diagnosis   . Mental disorder     bipolar  . Depression     Past Surgical History  Procedure Laterality Date  . Knee arthroscopy w/ allograft impant      LEFT KNEE, graft x 2  . Foot surgery      bilateral  . Anterior cervical decomp/discectomy fusion  2000    C6 - 7  . Pubovaginal sling  12/29/2011    Procedure: Gaynelle Arabian;  Surgeon: Reece Packer, MD;  Location: [facility];  Service: Urology;  Laterality: N/A;  cysto and sparc  sling   . Lumbar fusion    . Total abdominal hysterectomy w/ bilateral salpingoophorectomy  1997  . Multiple laparoscopies for endometriosis    . Cesarean section      X2  . Repeat reconstruction acl left knee/ screws removed  03-28-2000    CADAVER GRAFT  . Reconsturction of congenital uterus anomaly  AGE 23  . Knee surgery      TOTAL 8 SURG'S  . Cardiac catheterization  05-22-2008   DR SKAINS    NO SIGNIFECANT CAD/ NORMAL LV/  EF 65%/  NO WALL MOTION ABNORMALITIES  . Cystoscopy with injection  05/04/2012    Procedure: CYSTOSCOPY WITH INJECTION;  Surgeon: Reece Packer, MD;  Location: [facility];  Service: Urology;  Laterality: N/A;  MACROPLASTIQUE INJECTION  . Cystoscopy with injection  08/28/2012    Procedure: CYSTOSCOPY WITH INJECTION;  Surgeon: Reece Packer, MD;  Location: [facility];  Service: Urology;  Laterality: N/A;  cysto and macroplastique   . Right carpal tunnel surgery  04-25-2013    No family history on file.  History  Substance Use Topics  . Smoking status: Former Smoker -- 3.00 packs/day for 35 years    Types: Cigarettes    Quit date: 10/22/2012  . Smokeless tobacco: Never Used  .  Alcohol Use: No    OB History   Grav Para Term Preterm Abortions TAB SAB Ect Mult Living                  Review of Systems  Constitutional: Negative for fever and chills.  Respiratory: Negative for shortness of breath.   Cardiovascular: Negative for chest pain.  Gastrointestinal: Negative for nausea, vomiting and abdominal pain.  Musculoskeletal: Positive for back pain.  All other systems reviewed and are negative.    Allergies  Latex; Ibuprofen; Sweetening enhancer; Trazodone and nefazodone; and Triazolam  Home Medications   Current Outpatient Rx  Name  Route  Sig  Dispense  Refill  . alprazolam (XANAX) 2 MG tablet   Oral   Take 2 mg by mouth at bedtime as needed for sleep. For anxiety         . amphetamine-dextroamphetamine  (ADDERALL) 20 MG tablet   Oral   Take 20 mg by mouth daily before breakfast.          . clonazePAM (KLONOPIN) 2 MG tablet   Oral   Take 2 mg by mouth at bedtime as needed. For sleep         . diazepam (VALIUM) 10 MG tablet   Oral   Take 20 mg by mouth at bedtime as needed. For sleep         . doxepin (SINEQUAN) 75 MG capsule   Oral   Take 150 mg by mouth at bedtime.         [name] esomeprazole (NEXIUM) 40 MG capsule   Oral   Take 40 mg by mouth at bedtime.          . Insulin Human (INSULIN PUMP) 100 unit/ml SOLN   Subcutaneous   Inject into the skin. V go insulin pump, each click [id number] units, take 6 clicks with meal and 3 clicks with snack, uses novolin 70/30 insulin         . insulin NPH-insulin regular (NOVOLIN 70/30) (70-30) 100 UNIT/ML injection   Subcutaneous   Inject 3-6 Units into the skin 4 (four) times daily -  before meals and at bedtime. If pt has a snack 3 units and if have dinner get 6 units         . MAGNESIUM PO   Oral   Take 1 tablet by mouth daily.         . Oxcarbazepine (TRILEPTAL) 300 MG tablet   Oral   Take 900 mg by mouth at bedtime.          [name] oxycodone (OXY-IR) 5 MG capsule   Oral   Take 5 mg by mouth every 4 (four) hours as needed for pain.         [name] oxyCODONE-acetaminophen (PERCOCET) 10-325 MG per tablet   Oral   Take 1 tablet by mouth every 4 (four) hours as needed for pain.         . potassium chloride SA (K-DUR,KLOR-CON) 20 MEQ tablet   Oral   Take 20 mEq by mouth 2 (two) times daily.          . ramelteon (ROZEREM) 8 MG tablet   Oral   Take 16 mg by mouth at bedtime.          [name] spironolactone (ALDACTONE) 100 MG tablet   Oral   Take 100 mg by mouth daily.         [name] sulfamethoxazole-trimethoprim (BACTRIM DS,SEPTRA DS) 800-160 MG per tablet  Oral   Take 1 tablet by mouth 2 (two) times daily.   20 tablet   0     BP 126/88  Pulse 80  Temp(Src) 97 F (36.1 C) (Oral)  SpO2 100%  Physical Exam  Nursing  note and vitals reviewed. Constitutional: She is oriented to person, place, and time. Vital signs are normal. She appears well-developed and well-nourished. No distress.  HENT:  Head: Normocephalic and atraumatic.  Right Ear: External ear normal.  Left Ear: External ear normal.  Nose: Nose normal.  Mouth/Throat: Oropharynx is clear and moist.  Eyes: Conjunctivae are normal. Pupils are equal, round, and reactive to light.  Neck: Normal range of motion.  Cardiovascular: Normal rate, regular rhythm and normal heart sounds.   Pulmonary/Chest: Effort normal and breath sounds normal. No stridor. No respiratory distress. She has no wheezes. She has no rales.  Abdominal: Soft. She exhibits no distension.  Musculoskeletal: Normal range of motion.       Cervical back: She exhibits no tenderness, no bony tenderness and no deformity.       Thoracic back: She exhibits no tenderness and no bony tenderness.       Lumbar back: She exhibits tenderness.  ttp in sacral spine No step offs or deformity   Neurological: She is alert and oriented to person, place, and time. She has normal strength.  Skin: Skin is warm and dry. She is not diaphoretic. No erythema.  Psychiatric: She has a normal mood and affect. Her behavior is normal.    ED Course  Procedures (including critical care time)  Labs RDg Pelvis 1-2 Views  04/29/2013  *RADIOLOGY REPORT*  Clinical Data: Fall with pelvic pain.  PELVIS - 1-2 VIEW  Comparison: None  Findings: There is no evidence of acute fracture, subluxation or dislocation. No focal bony lesions are identified. The joint spaces are unremarkable. Ray cage fusion in the lower lumbar spine identified.  IMPRESSION: No evidence of acute abnormality.   Original Report Authenticated By: Margarette Canada, M.D.   eviewed - No data to display Dg Pelvis 1-2 Views  04/29/2013  *RADIOLOGY REPORT*  Clinical Data: Fall with pelvic pain.  PELVIS - 1-2 VIEW  Comparison: None  Findings: There is no evidence of  acute fracture, subluxation or dislocation. No focal bony lesions are identified. The joint spaces are unremarkable. Ray cage fusion in the lower lumbar spine identified.  IMPRESSION: No evidence of acute abnormality.   Original Report Authenticated By: Margarette Canada, M.D.      1. Sacral back pain       MDM  Patient presents today after falling on the toilet at her psychiatrist's office and is experiencing pain in her sacral and coccygeal area. Pelvic DG negative for acute pathology. No red flags including hx of CA, ivda, bowel or bladder incontinence. Discussed she likely has a bruise. Patient was extremely somnolent during ED stay, falling asleep during my exam multiple times. Denied taking any narcotic pain medication.  Due to her somnolence, I discussed alternating tylenol and advil for the pain and avoid sitting on hard surfaces until symptoms have improved. Return instructions given. Vital signs stable for discharge. Patient / Family / Caregiver informed of clinical course, understand medical decision-making process, and agree with plan.         Elwyn Lade, PA-C 04/30/13 1659

## 2013-04-29 NOTE — Progress Notes (Signed)
[facility] request, pt states tailbone hurts after 04-25-2013 fall

## 2013-04-29 NOTE — ED Notes (Signed)
Pt states that on thurs she fell against the toliet and has tail bone pain. Pt was able  To walk and sit on the chair. Pt just left her md and pre surgical area and states that she did not tell them about it.

## 2013-04-30 ENCOUNTER — Encounter (HOSPITAL_COMMUNITY): Payer: Self-pay | Admitting: *Deleted

## 2013-04-30 ENCOUNTER — Encounter (HOSPITAL_COMMUNITY)
Admission: RE | Disposition: A | Discharge status: Home / Self Care | Payer: Self-pay | Source: Ambulatory Visit | Attending: Urology

## 2013-04-30 ENCOUNTER — Ambulatory Visit (HOSPITAL_COMMUNITY)
Admission: RE | Admit: 2013-04-30 | Discharge: 2013-04-30 | Disposition: A | Discharge status: Home / Self Care | Payer: Medicare Other | Source: Ambulatory Visit | Attending: Urology | Admitting: Urology

## 2013-04-30 ENCOUNTER — Ambulatory Visit (HOSPITAL_COMMUNITY): Payer: Medicare Other | Admitting: Anesthesiology

## 2013-04-30 ENCOUNTER — Encounter (HOSPITAL_COMMUNITY): Payer: Self-pay | Admitting: Anesthesiology

## 2013-04-30 DIAGNOSIS — E119 Type 2 diabetes mellitus without complications: Secondary | ICD-10-CM | POA: Insufficient documentation

## 2013-04-30 DIAGNOSIS — Z794 Long term (current) use of insulin: Secondary | ICD-10-CM | POA: Insufficient documentation

## 2013-04-30 DIAGNOSIS — Y839 Surgical procedure, unspecified as the cause of abnormal reaction of the patient, or of later complication, without mention of misadventure at the time of the procedure: Secondary | ICD-10-CM | POA: Insufficient documentation

## 2013-04-30 DIAGNOSIS — N393 Stress incontinence (female) (male): Secondary | ICD-10-CM | POA: Insufficient documentation

## 2013-04-30 DIAGNOSIS — I10 Essential (primary) hypertension: Secondary | ICD-10-CM | POA: Insufficient documentation

## 2013-04-30 DIAGNOSIS — T85698A Other mechanical complication of other specified internal prosthetic devices, implants and grafts, initial encounter: Secondary | ICD-10-CM | POA: Insufficient documentation

## 2013-04-30 DIAGNOSIS — N949 Unspecified condition associated with female genital organs and menstrual cycle: Secondary | ICD-10-CM | POA: Insufficient documentation

## 2013-04-30 DIAGNOSIS — E7800 Pure hypercholesterolemia, unspecified: Secondary | ICD-10-CM | POA: Insufficient documentation

## 2013-04-30 DIAGNOSIS — N760 Acute vaginitis: Secondary | ICD-10-CM | POA: Insufficient documentation

## 2013-04-30 DIAGNOSIS — G4730 Sleep apnea, unspecified: Secondary | ICD-10-CM | POA: Insufficient documentation

## 2013-04-30 DIAGNOSIS — Z79899 Other long term (current) drug therapy: Secondary | ICD-10-CM | POA: Insufficient documentation

## 2013-04-30 HISTORY — PX: PUBOVAGINAL SLING: SHX1035

## 2013-04-30 HISTORY — PX: CYSTO: SHX6284

## 2013-04-30 LAB — GLUCOSE, CAPILLARY
Glucose-Capillary: 206 mg/dL — ABNORMAL HIGH (ref 70–99)
Glucose-Capillary: 194 mg/dL — ABNORMAL HIGH (ref 70–99)

## 2013-04-30 SURGERY — CYSTO
Anesthesia: General | Site: Vagina | Wound class: Clean Contaminated

## 2013-04-30 MED ORDER — ONDANSETRON HCL 4 MG/2ML IJ SOLN
INTRAMUSCULAR | Status: DC | PRN
  Administered 2013-04-30: 4 mg via INTRAVENOUS

## 2013-04-30 MED ORDER — LIDOCAINE-EPINEPHRINE (PF) 1 %-1:200000 IJ SOLN
INTRAMUSCULAR | Status: DC | PRN
  Administered 2013-04-30: 30 mL

## 2013-04-30 MED ORDER — LACTATED RINGERS IV SOLN: INTRAVENOUS | Status: DC

## 2013-04-30 MED ORDER — ACETAMINOPHEN 10 MG/ML IV SOLN
INTRAVENOUS | Status: DC | PRN
  Administered 2013-04-30: 1000 mg via INTRAVENOUS

## 2013-04-30 MED ORDER — LIDOCAINE HCL (CARDIAC) 20 MG/ML IV SOLN
INTRAVENOUS | Status: DC | PRN
  Administered 2013-04-30: 80 mg via INTRAVENOUS

## 2013-04-30 MED ORDER — SODIUM CHLORIDE 0.9 % IV SOLN
1.5000 g | INTRAVENOUS | Status: AC
  Administered 2013-04-30: 1.5 g via INTRAVENOUS

## 2013-04-30 MED ORDER — ESTRADIOL 0.1 MG/GM VA CREA
TOPICAL_CREAM | VAGINAL | Status: DC | PRN
  Administered 2013-04-30: 1 via VAGINAL

## 2013-04-30 MED ORDER — SODIUM CHLORIDE 0.9 % IR SOLN
Status: DC | PRN
  Administered 2013-04-30: 11:00:00

## 2013-04-30 MED ORDER — PROMETHAZINE HCL 25 MG/ML IJ SOLN: 6.2500 mg | INTRAMUSCULAR | Status: DC | PRN

## 2013-04-30 MED ORDER — HYDROMORPHONE HCL PF 1 MG/ML IJ SOLN
0.2500 mg | INTRAMUSCULAR | Status: DC | PRN
  Administered 2013-04-30: 0.25 mg via INTRAVENOUS

## 2013-04-30 MED ORDER — SODIUM CHLORIDE 0.9 % IR SOLN
Status: DC | PRN
  Administered 2013-04-30: 1000 mL via INTRAVESICAL

## 2013-04-30 MED ORDER — SODIUM CHLORIDE 0.9 % IV SOLN
INTRAVENOUS | Status: AC
  Filled 2013-04-30: qty 1.5

## 2013-04-30 MED ORDER — PROPOFOL 10 MG/ML IV BOLUS
INTRAVENOUS | Status: DC | PRN
  Administered 2013-04-30: 200 mg via INTRAVENOUS

## 2013-04-30 MED ORDER — ACETAMINOPHEN 10 MG/ML IV SOLN
INTRAVENOUS | Status: AC
  Filled 2013-04-30: qty 100

## 2013-04-30 MED ORDER — HYDROMORPHONE HCL PF 1 MG/ML IJ SOLN
INTRAMUSCULAR | Status: AC
  Filled 2013-04-30: qty 1

## 2013-04-30 MED ORDER — SUCCINYLCHOLINE CHLORIDE 20 MG/ML IJ SOLN
INTRAMUSCULAR | Status: DC | PRN
  Administered 2013-04-30: 100 mg via INTRAVENOUS

## 2013-04-30 MED ORDER — HYDROMORPHONE HCL PF 1 MG/ML IJ SOLN
INTRAMUSCULAR | Status: DC | PRN
  Administered 2013-04-30: 2 mg via INTRAVENOUS

## 2013-04-30 MED ORDER — LACTATED RINGERS IV SOLN
INTRAVENOUS | Status: DC | PRN
  Administered 2013-04-30: 09:00:00 via INTRAVENOUS

## 2013-04-30 MED ORDER — ESTRADIOL 0.1 MG/GM VA CREA
TOPICAL_CREAM | VAGINAL | Status: AC
  Filled 2013-04-30: qty 42.5

## 2013-04-30 MED ORDER — LIDOCAINE-EPINEPHRINE (PF) 1 %-1:200000 IJ SOLN
INTRAMUSCULAR | Status: AC
  Filled 2013-04-30: qty 10

## 2013-04-30 MED ORDER — FENTANYL CITRATE 0.05 MG/ML IJ SOLN
INTRAMUSCULAR | Status: DC | PRN
  Administered 2013-04-30: 150 ug via INTRAVENOUS
  Administered 2013-04-30: 100 ug via INTRAVENOUS

## 2013-04-30 MED ORDER — ESTRADIOL 0.1 MG/GM VA CREA
1.0000 | TOPICAL_CREAM | Freq: Every day | VAGINAL | Status: DC
  Filled 2013-04-30: qty 42.5

## 2013-04-30 SURGICAL SUPPLY — 61 items
ADH SKN CLS APL DERMABOND .7 (GAUZE/BANDAGES/DRESSINGS)
BAG URINE DRAINAGE (UROLOGICAL SUPPLIES) ×3 IMPLANT
BAG URO CATCHER STRL LF (DRAPE) ×1 IMPLANT
BLADE HEX COATED 2.75 (ELECTRODE) ×2 IMPLANT
BLADE SURG 15 STRL LF DISP TIS (BLADE) ×4 IMPLANT
BLADE SURG 15 STRL SS (BLADE) ×3
CATH FOLEY 2WAY SLVR  5CC 14FR (CATHETERS)
CATH FOLEY 2WAY SLVR  5CC 16FR (CATHETERS)
CATH FOLEY 2WAY SLVR 5CC 14FR (CATHETERS) ×2 IMPLANT
CATH FOLEY 2WAY SLVR 5CC 16FR (CATHETERS) ×2 IMPLANT
CLOTH BEACON ORANGE TIMEOUT ST (SAFETY) ×3 IMPLANT
COVER MAYO STAND STRL (DRAPES) IMPLANT
DECANTER SPIKE VIAL GLASS SM (MISCELLANEOUS) ×3 IMPLANT
DERMABOND ADVANCED (GAUZE/BANDAGES/DRESSINGS)
DERMABOND ADVANCED .7 DNX12 (GAUZE/BANDAGES/DRESSINGS) ×2 IMPLANT
DEVICE CAPIO SUTURING (INSTRUMENTS)
DEVICE CAPIO SUTURING OPC (INSTRUMENTS) IMPLANT
DRAIN PENROSE 18X1/4 LTX STRL (WOUND CARE) IMPLANT
DRAIN PENROSE LF 8X20.3CM SIL (WOUND CARE) ×2 IMPLANT
DRAPE CAMERA CLOSED 9X96 (DRAPES) ×1 IMPLANT
DRAPE LG THREE QUARTER DISP (DRAPES) ×2 IMPLANT
DRAPE UTILITY XL STRL (DRAPES) ×3 IMPLANT
GAUZE PACKING 2X5 YD STERILE (GAUZE/BANDAGES/DRESSINGS) ×5 IMPLANT
GAUZE SPONGE 4X4 16PLY XRAY LF (GAUZE/BANDAGES/DRESSINGS) ×8 IMPLANT
GLOVE BIOGEL M STRL SZ7.5 (GLOVE) ×7 IMPLANT
GLOVE ECLIPSE 8.0 STRL XLNG CF (GLOVE) ×4 IMPLANT
GOWN PREVENTION PLUS XLARGE (GOWN DISPOSABLE) ×2 IMPLANT
GOWN STRL REIN XL XLG (GOWN DISPOSABLE) ×7 IMPLANT
HOLDER FOLEY CATH W/STRAP (MISCELLANEOUS) ×3 IMPLANT
KIT BASIN OR (CUSTOM PROCEDURE TRAY) ×3 IMPLANT
NDL MAYO 6 CRC TAPER PT (NEEDLE) ×1 IMPLANT
NEEDLE HYPO 22GX1.5 SAFETY (NEEDLE) ×1 IMPLANT
NEEDLE INJECT RIGID (NEEDLE) IMPLANT
NEEDLE INJECT RIGID 21GA 14.6 (NEEDLE) IMPLANT
NEEDLE MAYO .5 CIRCLE (NEEDLE) IMPLANT
NEEDLE MAYO 6 CRC TAPER PT (NEEDLE) IMPLANT
PACK CYSTO (CUSTOM PROCEDURE TRAY) ×3 IMPLANT
PENCIL BUTTON HOLSTER BLD 10FT (ELECTRODE) ×3 IMPLANT
PLUG CATH AND CAP STER (CATHETERS) ×3 IMPLANT
RETRACTOR STAY HOOK 5MM (MISCELLANEOUS) ×1 IMPLANT
SHEET LAVH (DRAPES) ×3 IMPLANT
SUT CAPIO ETHIBPND (SUTURE) IMPLANT
SUT CAPIO POLYGLYCOLIC (SUTURE) IMPLANT
SUT ETHIBOND 0 (SUTURE) ×2 IMPLANT
SUT SILK 2 0 SH (SUTURE) ×3 IMPLANT
SUT VIC AB 0 CT1 27 (SUTURE)
SUT VIC AB 0 CT1 27XBRD ANTBC (SUTURE) ×2 IMPLANT
SUT VIC AB 2-0 CT1 27 (SUTURE)
SUT VIC AB 2-0 CT1 27XBRD (SUTURE) ×4 IMPLANT
SUT VIC AB 2-0 SH 27 (SUTURE) ×6
SUT VIC AB 2-0 SH 27X BRD (SUTURE) ×10 IMPLANT
SUT VIC AB 3-0 SH 27 (SUTURE) ×6
SUT VIC AB 3-0 SH 27XBRD (SUTURE) ×6 IMPLANT
SUT VIC AB 4-0 PC3 18 (SUTURE) ×2 IMPLANT
SUT VIC AB 4-0 PS2 27 (SUTURE) ×2 IMPLANT
SUT VICRYL 0 UR6 27IN ABS (SUTURE) ×12 IMPLANT
SYRINGE 10CC LL (SYRINGE) ×3 IMPLANT
TOWEL OR NON WOVEN STRL DISP B (DISPOSABLE) ×3 IMPLANT
TUBING CONNECTING 10 (TUBING) ×5 IMPLANT
WATER STERILE IRR 1500ML POUR (IV SOLUTION) ×1 IMPLANT
YANKAUER SUCT BULB TIP 10FT TU (MISCELLANEOUS) ×3 IMPLANT

## 2013-04-30 NOTE — Anesthesia Preprocedure Evaluation (Addendum)
Anesthesia Evaluation  Patient identified by MRN, date of birth, ID band Patient awake    Reviewed: Allergy & Precautions, H&P , NPO status , Patient's Chart, lab work & pertinent test results, reviewed documented beta blocker date and time   Airway Mallampati: III TM Distance: >3 FB Neck ROM: Full    Dental no notable dental hx.    Pulmonary shortness of breath, sleep apnea , Current Smoker,  Non-compliant with CPAP   + decreased breath sounds      Cardiovascular hypertension, Pt. on medications Rhythm:Regular Rate:Normal     Neuro/Psych PSYCHIATRIC DISORDERS Anxiety Depression Bipolar Disorder negative neurological ROS  negative psych ROS   GI/Hepatic Neg liver ROS, GERD-  Medicated and Controlled,N[facility] heart cath 2009   Endo/Other  diabetes, Type 2, Insulin DependentPoor compliance with insulin No AM insulin Obesity  Renal/GU negative Renal ROS   SUI    Musculoskeletal negative musculoskeletal ROS (+)   Abdominal   Peds negative pediatric ROS (+)  Hematology negative hematology ROS (+)   Anesthesia Other Findings   Reproductive/Obstetrics negative OB ROS                           Anesthesia Physical Anesthesia Plan  ASA: III  Anesthesia Plan:    Post-op Pain Management:    Induction:   Airway Management Planned:   Additional Equipment:   Intra-op Plan:   Post-operative Plan:   Informed Consent:   Plan Discussed with:   Anesthesia Plan Comments:         Anesthesia Quick Evaluation

## 2013-04-30 NOTE — Interval H&P Note (Signed)
History and Physical Interval Note:  04/30/2013 9:38 AM  [name]  has presented today for surgery, with the diagnosis of EXPOSED VAGINAL MESH  The various methods of treatment have been discussed with the patient and family. After consideration of risks, benefits and other options for treatment, the patient has consented to  Procedure(s): CYSTO (N/A) REMOVAL OF VAGINAL MESH (N/A) as a surgical intervention .  The patient's history has been reviewed, patient examined, no change in status, stable for surgery.  I have reviewed the patient's chart and labs.  Questions were answered to the patient's satisfaction.     Nachman Sundt A

## 2013-04-30 NOTE — H&P (View-Only) (Signed)
Need PRE OP ORDERS [id number]   Has PST APPT 04/23/13  THANKS

## 2013-04-30 NOTE — Transfer of Care (Signed)
Immediate Anesthesia Transfer of Care Note  Patient: [name]  Procedure(s) Performed: Procedure(s): CYSTOSCOPY (N/A) REMOVAL OF VAGINAL MESH (N/A)  Patient Location: PACU  Anesthesia Type:General  Level of Consciousness: awake and alert   Airway & Oxygen Therapy: Patient Spontanous Breathing and Patient connected to face mask oxygen  Post-op Assessment: Report given to PACU RN and Post -op Vital signs reviewed and stable  Post vital signs: Reviewed and stable  Complications: No apparent anesthesia complications

## 2013-04-30 NOTE — Progress Notes (Signed)
Pt states on arrival to short stay she has lost her driver license and medicaid card and had to go back to car to see if she dropped them.  Pt came back to short stay and stated she had not found them.

## 2013-04-30 NOTE — Progress Notes (Signed)
Informed Dr. Winfred Leeds of CBG 205 in short stay pre-op and she currently has insulin pump attached but unaware of how much her basal insulin rate is.  She has not given herself any additional insulin last night per pt and is also unaware of how much insulin is left in her pump, Dr Winfred Leeds made aware.  No new orders given.

## 2013-04-30 NOTE — Op Note (Signed)
Preoperative diagnosis: Mesh extrusion into vagina  Postoperative diagnosis: Mass extrusion into vagina Surgery: Removal of extruded mesh and cystoscopy Surgeon: Dr. Nicki Reaper Casondra Gasca Assistant: Leta Baptist  The patient has the above diagnoses and consented the above procedure. Preoperative antibiotics given. Importantly she's at one pad a day preoperatively which is a vague improvement. Leg position was excellent to minimize the risk of compartment syndrome and neuropathy and deep vein thrombosis. Double-ring retractor and curved cerebellar retractor were utilized  I initially cystoscoped the patient. Bladder mucosa and trigone were normal. Is no stitch or foreign body or carcinoma. There is no sling in the urethra  To my surprise the suburethral sling due to the vaginal wall the distance had re\re epithelialized but is still see small and 10 of the mesh. It was covered low thinly.  The sling was in the mid urethra. Without an incision it was easy to place a small right angle retractor starting with the more distal edge and carefully placed in the Foley under the sling delivering its tip. I then sharply dissected with a little bit of blunt dissection the sling to the urethrovesical angle bilaterally. I mobilized approximately a half a centimeter in each direction and then I cut the sling and added a tonsil to each cut tip. I then was able to more easily with good retraction and suction sharply mobilized the sling to the endopelvic fascia bilateral cutting the sling and sending it for pathology  There was very little bleeding. There was a lead edge to close circumferentially. I placed a 2-0 Vicryl on the left and right apex and passed a 2-0 Vicryl suture medially. Hemostasis was excellent. Closure was very good. Vaginal pack with Estrace was applied. Patient was re\re cystoscoped there is no injury to bladder bladder neck or urethra  Patient was taken to recover

## 2013-04-30 NOTE — Anesthesia Postprocedure Evaluation (Signed)
Anesthesia Post Note  Patient: [name]  Procedure(s) Performed: Procedure(s) (LRB): CYSTOSCOPY (N/A) REMOVAL OF VAGINAL MESH (N/A)  Anesthesia type: General  Patient location: PACU  Post pain: Pain level controlled  Post assessment: Post-op Vital signs reviewed  Last Vitals:  Filed Vitals:   04/30/13 1204  BP: 114/65  Pulse: 90  Temp: 36.6 C  Resp: 18    Post vital signs: Reviewed  Level of consciousness: sedated  Complications: No apparent anesthesia complications

## 2013-04-30 NOTE — Progress Notes (Addendum)
Removed vaginal packing. Patient found procedure to be very uncomfortable. Minimal vaginal bleeding. Stretch net briefs and peri pad placed on patient.  1415   Patient is out of bed to bathroom. It took patient 15-20 minutes to void. Voided 450 ml of amber urine with small amount of blood ( no clots). Post void residual was 116 ml. Paged Dr Matilde Sprang who requested to see patient  Prior to discharge.   Dr Matilde Sprang over to see patient . He is going to give her Rx for oxycodone and cipro.

## 2013-04-30 NOTE — H&P (Signed)
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 She failed a sling. She really thinks the 2nd Macroplastique did help a lot. She only wears 1 to 2 pads a day that are damp. She takes a long time to void and she is straining. She takes a long time to empty.   Two weeks ago she felt uncomfortable in the vaginal area and she felt inflamed. Her gynecologist gave her some cream. She has had no discharge or blood.   She was down today because one of her friends died a few days ago in a motor vehicle accident and her uncle died 3 weeks ago.   Her gynecologist wanted her checked for any exposed mesh.   There is no other modifying factors or associated signs or symptoms. There is no other aggravating or relieving factors. Her symptoms are mild to moderate in severity and persistent.   Urinalysis: Positive bacteria, rare yeast. Urine sent for culture.   On examination, [name] labia looked inflamed and reddened, but they were nontender. Her bladder neck was well supported, but she obviously had exposed mesh for approximately 1-1/2 cm underneath the urethra, extending to her right side. There was no bloody discharge. I felt I could see almost the entire wig of the sling. It is almost as if her tissues had dehisced.   [name] has not been sexually active recently.    Past Medical History Problems  1. History of  Adult Sleep Apnea 780.57 2. History of  Anxiety (Symptom) 300.00 3. History of  Arthritis V13.4 4. History of  Depression 311 5. History of  Diabetes Mellitus 250.00 6. History of  Heartburn 787.1 7. History of  Hypercholesterolemia 272.0 8. History of  Hypertension 401.9  Surgical History Problems  1. History of  Endoscopic Injection Of Implant Material Into Bladder Neck 2. History of  Endoscopic Injection Of Implant Material Into Bladder Neck 3. History of  Endoscopic Injection Of Implant  Material Into Bladder Neck 4. History of  Foot Surgery 5. History of  Hernia Repair 6. History of  Knee Surgery 7. History of  Neck Surgery 8. History of  Shoulder Surgery 9. History of  Shoulder Surgery Right 10. History of  Spine Repair 11. History of  Vaginal Sling Operation For Stress Incontinence  Current Meds 1. Abilify 20 MG Oral Tablet; TAKE 1 TABLET DAILY; Therapy: (Recorded:22Mar2012) to 2. ALPRAZolam 2 MG Oral Tablet; 2 at bedtime; Therapy: (Recorded:22Mar2012) to 3. Amphetamine-Dextroamphetamine 20 MG Oral Tablet; 2 tablets daily; Therapy: [id number] to 4. Atrovent HFA 17 MCG/ACT Inhalation Aerosol Solution; Therapy: [id number] to 5. BD Pen Needle Short U/F 31G X 8 MM Miscellaneous; Therapy: 11Oct2012 to 6. Cymbalta 60 MG Oral Capsule Delayed Release Particles; 60mg ; 2 tablets daily; Therapy:  (Recorded:22Mar2012) to 7. Doxepin HCl 100 MG Oral Capsule; 2 capsules at bedtime; Therapy: [id number] to 8. Fenofibrate 160 MG Oral Tablet; TAKE 1 TABLET DAILY; Therapy: (Recorded:22Mar2012) to 9. Furosemide 40 MG Oral Tablet; Therapy: [id number] to 10. KlonoPIN TABS; 2 tablets at night; Therapy: (Recorded:22Mar2012) to 11. Lantus 100 UNIT/ML Subcutaneous Solution; 44u; inject 1 x daily; Therapy: [id number] to 12. Lisinopril 10 MG Oral Tablet; 10mg ; 1 tablet daily; Therapy: (Recorded:22Mar2012) to 40. MetFORMIN HCl ER 750 MG Oral Tablet Extended Release 24 Hour; 750mg ; 3 tablets daily;   Therapy: (Recorded:22Mar2012) to 14. Naproxen Sodium 550 MG Oral Tablet; 1 tablet as needed; Therapy: 30Jun2011 to 15. NexIUM 40 MG Oral  Capsule Delayed Release; 1 daily; Therapy: (Recorded:22Mar2012) to 16. NovoLIN 70/30 (70-30) 100 UNIT/ML Subcutaneous Suspension; Therapy: [id number] to 17. NovoLOG FlexPen 100 UNIT/ML Subcutaneous Solution; Therapy: 24Apr2013 to 18. OXcarbazepine 300 MG Oral Tablet; Therapy: [id number] to 19. OxyCODONE HCl 5 MG Oral Tablet; Therapy: [id number] to 20.  Oxycodone-Acetaminophen 7.5-325 MG Oral Tablet; Therapy: [id number] to 21. Pristiq 50 MG Oral Tablet Extended Release 24 Hour; Therapy: [id number] to 22. ProAir HFA 108 (90 Base) MCG/ACT Inhalation Aerosol Solution; Therapy: 20Jan2012 to 23. Rozerem 8 MG Oral Tablet; TAKE 2 TABLETS AT BEDTIME; Therapy: (Recorded:22Mar2012) to 24. Spironolactone 25 MG Oral Tablet; Therapy: [id number] to 25. Tamsulosin HCl 0.4 MG Oral Capsule; TAKE 1 CAPSULE EVERY DAY; Therapy: [id number] to   (Evaluate:01Mar2012); Last [id number]. Victoza 18 MG/3ML Subcutaneous Solution; inject 1xdaily; Therapy: 06Sep2011 to  Allergies Medication  1. Ibuprofen CAPS 2. Trazodone and Deriv  Family History Problems  1. Maternal history of  Death In The Family Mother age 86;lymphoma cancer 2. Family history of  Family Health Status Number Of Children 1 son; 1 daughter 3. Paternal history of  Kidney Cancer V16.51  Social History Problems  1. Alcohol Use occ. 2. Caffeine Use 3-4 a day 3. Marital History - Divorced 4. Tobacco Use V15.82 3-4 pks a day since age 5 Denied  42. History of  Occupation:  Results/Data  Urine [Data Includes: Last 1 Day]   [id number]  COLOR YELLOW   APPEARANCE CLOUDY   SPECIFIC GRAVITY <1.005   pH 6.5   GLUCOSE > 1000 mg/dL  BILIRUBIN NEG   KETONE NEG mg/dL  BLOOD SMALL   PROTEIN NEG mg/dL  UROBILINOGEN 0.2 mg/dL  NITRITE NEG   LEUKOCYTE ESTERASE MOD   SQUAMOUS EPITHELIAL/HPF FEW   WBC TNTC WBC/hpf  RBC 0-2 RBC/hpf  BACTERIA MODERATE   CRYSTALS NONE SEEN   CASTS NONE SEEN   Other RARE YEAST    Assessment Assessed  1. Female Stress Incontinence 625.6 2. Vaginal Pain 625.8  Plan   Discussion/Summary   [name] is having vaginal discomfort. She has had a dramatic improvement from her Macroplastique in September 2013.   [name] may have had a recent episode of vaginitis, possibly related to the exposed sling. I drew her a picture. I did not perform cystoscopy, and  it is very doubtful that she has any erosion in the urethra based upon today's findings. I wonder if her tissues may have broken down from a vaginitis, or really from all of the heavy straining she does.   I have recommended removal of the suburethral component of the sling. There is no question it could worsen her stress incontinence, but it may not since it did not help in the past, and she has had Macroplastique I went over risks in detail including infection, bleeding, pain, injury to other structures with sequelae. We talked about rare risks such as fistula or bladder or urethral injury requiring further surgery. I would cystoscope at the time to rule out a urethral erosion. I do not think the exposure is related the Macroplastique treatment, but theoretically it could be.   The more I thought about things I thought it was best to cystoscope [name]. She did not have blood in the urine, but I wanted to make certain she did not have urethral erosion or a bladder erosion.   Cystoscopy: Today the patient underwent cystoscopy after discussing pros, cons and risks. The procedure was performed to assess the bladder/urethra and to rule out an intravesical cause  of their symptoms. A well lubricated, sterile cystoscope was utilized and gently inserted into the urethra. The bladder mucosa and trigone were normal. There was no stitch, foreign body, or carcinoma. There was clear urine effluxing from both ureters. The procedure was well tolerated. She had no sling in the bladder. She had a long healthy urethra with no erosion.   In addition to reviewing risks, I placed [name] on 3 days of Diflucan because of the urinalysis. We also have _____ failure rate from the procedure and reexposure rates. She tolerates cystoscopy well. In my opinion, this erosion is new since it was only a few months ago. I examined her under anesthesia and the findings were normal. Her degree of exposure today was  significant.   [name] has not had intercourse recently. She understands that it is difficult to say whether or not her vaginitis was a result of the exposure or potentially the cause of the exposure. I am more inclined to think it is from her heavy straining.   After a thorough review of the management options for the patient's condition the patient  elected to proceed with surgical therapy as noted above. We have discussed the potential benefits and risks of the procedure, side effects of the proposed treatment, the likelihood of the patient achieving the goals of the procedure, and any potential problems that might occur during the procedure or recuperation. Informed consent has been obtained.

## 2013-05-01 ENCOUNTER — Encounter (HOSPITAL_COMMUNITY): Payer: Self-pay | Admitting: Urology

## 2013-05-01 LAB — GLUCOSE, CAPILLARY: Glucose-Capillary: 218 mg/dL — ABNORMAL HIGH (ref 70–99)

## 2013-05-01 NOTE — ED Provider Notes (Signed)
Medical screening examination/treatment/procedure(s) were performed by non-physician practitioner and as supervising physician I was immediately available for consultation/collaboration.   Mirna Mires, MD 05/01/13 1115

## 2013-07-22 ENCOUNTER — Ambulatory Visit (INDEPENDENT_AMBULATORY_CARE_PROVIDER_SITE_OTHER): Payer: Medicare Other | Admitting: Endocrinology

## 2013-07-22 ENCOUNTER — Telehealth: Payer: Self-pay | Admitting: *Deleted

## 2013-07-22 ENCOUNTER — Encounter: Payer: Self-pay | Admitting: Endocrinology

## 2013-07-22 ENCOUNTER — Other Ambulatory Visit: Payer: Self-pay | Admitting: *Deleted

## 2013-07-22 VITALS — BP 130/82 | HR 96 | Temp 98.4°F | Resp 12 | Ht 65.0 in | Wt 192.3 lb

## 2013-07-22 DIAGNOSIS — I10 Essential (primary) hypertension: Secondary | ICD-10-CM

## 2013-07-22 DIAGNOSIS — E1142 Type 2 diabetes mellitus with diabetic polyneuropathy: Secondary | ICD-10-CM | POA: Insufficient documentation

## 2013-07-22 DIAGNOSIS — E1149 Type 2 diabetes mellitus with other diabetic neurological complication: Secondary | ICD-10-CM

## 2013-07-22 DIAGNOSIS — E1165 Type 2 diabetes mellitus with hyperglycemia: Secondary | ICD-10-CM | POA: Insufficient documentation

## 2013-07-22 LAB — COMPREHENSIVE METABOLIC PANEL
ALT: 9 U/L (ref 0–35)
AST: 14 U/L (ref 0–37)
Albumin: 3.7 g/dL (ref 3.5–5.2)
Alkaline Phosphatase: 89 U/L (ref 39–117)
Glucose, Bld: 459 mg/dL — ABNORMAL HIGH (ref 70–99)
Potassium: 3.7 mEq/L (ref 3.5–5.1)
Sodium: 130 mEq/L — ABNORMAL LOW (ref 135–145)
Total Bilirubin: 0.3 mg/dL (ref 0.3–1.2)
Total Protein: 6.8 g/dL (ref 6.0–8.3)

## 2013-07-22 LAB — LDL CHOLESTEROL, DIRECT: Direct LDL: 68.2 mg/dL

## 2013-07-22 LAB — LIPID PANEL
Total CHOL/HDL Ratio: 5
VLDL: 127.2 mg/dL — ABNORMAL HIGH (ref 0.0–40.0)

## 2013-07-22 LAB — HEMOGLOBIN A1C: Hgb A1c MFr Bld: 12 % — ABNORMAL HIGH (ref 4.6–6.5)

## 2013-07-22 MED ORDER — INSULIN ASPART 100 UNIT/ML ~~LOC~~ SOLN: SUBCUTANEOUS | Status: DC

## 2013-07-22 MED ORDER — GABAPENTIN 300 MG PO CAPS: 300.0000 mg | ORAL_CAPSULE | Freq: Three times a day (TID) | ORAL | Status: DC

## 2013-07-22 MED ORDER — V-GO 30 KIT: 1.0000 | PACK | Freq: Every day | Status: DC

## 2013-07-22 NOTE — Telephone Encounter (Signed)
Pt notified and rx sent  °

## 2013-07-22 NOTE — Telephone Encounter (Signed)
Please call the patient about this and tell her to get the pump refilled and also to start Plain NovoLog insulin in the pump

## 2013-07-22 NOTE — Patient Instructions (Signed)
Please check blood sugars at least half the time about 2 hours after any meal and as directed on waking up. Please bring blood sugar monitor to each visit  NO REGULAR DRINKS  Do clicks before meals and 3-4 clicks when sugar > [id number]

## 2013-07-22 NOTE — Progress Notes (Signed)
Patient ID: [name], female   DOB: July 18, 1966, 47 y.o.   MRN: [id number]  Reason for Appointment: Diabetes follow-up   History of Present Illness     Diagnosis: Type 2 diabetes mellitus, date of diagnosis: 2004.   Complications: are:, peripheral neuropathy.  Diabetes associated symptoms: Fatigue, Polydipsia.  Proper timing of medications in relation to meals: No, takes Her boluses after eating at times.  Monitors blood glucose: off and on  Glucometer: [employer] brand.   Blood Glucose readings: Before breakfast: 200, 338 today    PAST history: She has been treated mostly with insulin since about a year after diagnosis. She has had difficulty with consistent compliance with diet and also compliance with self care including glucose monitoring over the years. She had been mostly treated with basal insulin. Also had been tried on mealtime insulin but she would be noncompliant with this. Was tried on Prandin for mealtime control but difficult to judge efficacy because of lack of postprandial monitoring. Was given Victoza to start in 2011 but did not follow up after this.She was tried on premixed insulin but this did not help her control, mostly because of noncompliance with the doses.   RECENT history:  She is now using the V- go pump and was doing fairly well initially with starting this. She has had frequent education visits also. She has been able to do the boluses also and she thinks she is more compliant with these. She does however eat very regularly and limbs that she did not eat for 2 days sometimes.  She is not watching her diet overall and drinking regular soft drinks She thinks that because she is not bolusing much she will leave her pump on for a couple of days and not change it.  History obtained from the pharmacy indicates that she has not refilled her pump supplies for the last 2 months Appears to have mostly high  blood sugars and she does not know why. She says she is feeling  very thirsty and has a dry mouth. Does not use a correction dose for high readings.  The insulin regimen is described as 12 u bolus at mealtime With the V-Go pump 30.   Hypoglycemia frequency: Never.  Food preferences: eating one or 2 meals per day .  Physical activity: exercise: Minimal.  Certified Diabetes Educator visit: Most recent:, 2/14.  The diet that the patient has been following is no real diet And eating at irregular times, no balanced meals.  The last HbgA1c was reported as 11.9% in 12/13. Previously in 06/2010 it was 6.7.   The microalbumin has been tested , and the result is 41.   Hypertension:  Here for hypertension F/U BP is well-controlled , patient is on Aldactone from PCP but blood pressure is not controlled   NEUROPATHY: She says she is having tingling and discomfort in her fingers now and not as much in her feet but has not been on any treatment She thinks she has taken Lyrica before, but is concerned about the potential weight gain  DEPRESSION/decreased memory: She says she is going to be seen by a neurologist. Likely not compliant with her medications because of forgetfulness       Medication List       This list is accurate as of: 07/22/13 11:50 AM.  Always use your most recent med list.               alprazolam 2 MG tablet  Commonly known as:  XANAX  Take 2 mg by mouth at bedtime as needed for sleep. For anxiety     amphetamine-dextroamphetamine 20 MG tablet  Commonly known as:  ADDERALL  Take 20 mg by mouth daily before breakfast.     clonazePAM 2 MG tablet  Commonly known as:  KLONOPIN  Take 2 mg by mouth at bedtime as needed. For sleep     diazepam 10 MG tablet  Commonly known as:  VALIUM  Take 20 mg by mouth at bedtime as needed. For sleep     doxepin 75 MG capsule  Commonly known as:  SINEQUAN  Take 150 mg by mouth at bedtime.     esomeprazole 40 MG capsule  Commonly known as:  NEXIUM  Take 40 mg by mouth at bedtime.     FETZIMA 40 MG  Cp24  Generic drug:  Levomilnacipran HCl ER     HYDROcodone-acetaminophen 5-325 MG per tablet  Commonly known as:  NORCO/VICODIN     insulin NPH-regular (70-30) 100 UNIT/ML injection  Commonly known as:  NOVOLIN 70/30  Inject 3-6 Units into the skin 4 (four) times daily -  before meals and at bedtime. If pt has a snack 3 units and if have dinner get 6 units     insulin pump 100 unit/ml Soln  Inject into the skin. V go insulin pump, each click [id number] units, take 6 clicks with meal and 3 clicks with snack, uses novolin 70/30 insulin     MAGNESIUM PO  Take 1 tablet by mouth daily.     NOVOLOG MIX 70/30 (70-30) 100 UNIT/ML injection  Generic drug:  insulin aspart protamine- aspart     Oxcarbazepine 300 MG tablet  Commonly known as:  TRILEPTAL  Take 900 mg by mouth at bedtime.     oxycodone 5 MG capsule  Commonly known as:  OXY-IR  Take 5 mg by mouth every 4 (four) hours as needed for pain.     oxyCODONE-acetaminophen 10-325 MG per tablet  Commonly known as:  PERCOCET  Take 1 tablet by mouth every 4 (four) hours as needed for pain.     potassium chloride SA 20 MEQ tablet  Commonly known as:  K-DUR,KLOR-CON  Take 20 mEq by mouth 2 (two) times daily.     ROZEREM 8 MG tablet  Generic drug:  ramelteon  Take 16 mg by mouth at bedtime.     spironolactone 100 MG tablet  Commonly known as:  ALDACTONE  Take 100 mg by mouth daily.     sulfamethoxazole-trimethoprim 800-160 MG per tablet  Commonly known as:  BACTRIM DS,SEPTRA DS  Take 1 tablet by mouth 2 (two) times daily.     V-GO 30 Kit        Allergies:  Allergies  Allergen Reactions  . Latex Other (See Comments)    Pt states she cannot use condoms - cause an infection.  Use of latex on skin is okay  . Ibuprofen Other (See Comments)    headaches  . Sweetening Enhancer (Flavoring Agent) Nausea And Vomiting    And headaches  . Trazodone And Nefazodone Other (See Comments)    Hallucinations   . Triazolam Other (See  Comments)    hallucinations    Past Medical History  Diagnosis Date  . Hypertension   . Arthritis   . Anxiety   . Sleep apnea     didn't tolerate cpap  . Hyperlipemia   . GERD (gastroesophageal reflux disease)   . Insomnia   .  SUI (stress urinary incontinence, female) S/P SLING 12-29-2011  . SOB (shortness of breath) on exertion   . Diabetes mellitus   . Blood transfusion without reported diagnosis   . Mental disorder     bipolar  . Depression     Past Surgical History  Procedure Laterality Date  . Knee arthroscopy w/ allograft impant      LEFT KNEE, graft x 2  . Foot surgery      bilateral  . Anterior cervical decomp/discectomy fusion  2000    C6 - 7  . Pubovaginal sling  12/29/2011    Procedure: Gaynelle Arabian;  Surgeon: Reece Packer, MD;  Location: [facility];  Service: Urology;  Laterality: N/A;  cysto and sparc sling   . Lumbar fusion    . Total abdominal hysterectomy w/ bilateral salpingoophorectomy  1997  . Multiple laparoscopies for endometriosis    . Cesarean section      X2  . Repeat reconstruction acl left knee/ screws removed  03-28-2000    CADAVER GRAFT  . Reconsturction of congenital uterus anomaly  AGE 5  . Knee surgery      TOTAL 8 SURG'S  . Cardiac catheterization  05-22-2008   DR SKAINS    NO SIGNIFECANT CAD/ NORMAL LV/  EF 65%/  NO WALL MOTION ABNORMALITIES  . Cystoscopy with injection  05/04/2012    Procedure: CYSTOSCOPY WITH INJECTION;  Surgeon: Reece Packer, MD;  Location: [facility];  Service: Urology;  Laterality: N/A;  MACROPLASTIQUE INJECTION  . Cystoscopy with injection  08/28/2012    Procedure: CYSTOSCOPY WITH INJECTION;  Surgeon: Reece Packer, MD;  Location: [facility];  Service: Urology;  Laterality: N/A;  cysto and macroplastique   . Right carpal tunnel surgery  04-25-2013  . Cysto N/A 04/30/2013    Procedure: CYSTOSCOPY;  Surgeon: Reece Packer, MD;  Location: [facility];   Service: Urology;  Laterality: N/A;  . Pubovaginal sling N/A 04/30/2013    Procedure: REMOVAL OF VAGINAL MESH;  Surgeon: Reece Packer, MD;  Location: [facility];  Service: Urology;  Laterality: N/A;    No family history on file.  Social History:  reports that she quit smoking about 8 months ago. Her smoking use included Cigarettes. She has a 105 pack-year smoking history. She has never used smokeless tobacco. She reports that she does not drink alcohol or use illicit drugs.  Review of Systems -   She is complaining about difficulty remembering and is going to see a neurologist  She has history of high triglycerides, last level was 319 and is supposed to be taking fenofibrate. Her last LDL was 73  No recent pedal edema  Complaining about a dry mouth  Long-standing history of depression   Examination:   BP 138/90  Pulse 96  Temp(Src) 98.4 F (36.9 C)  Resp 12  Ht 5\' 5"  (1.651 m)  Wt 192 lb 4.8 oz (87.227 kg)  BMI 32 kg/m2  SpO2 97%  Body mass index is 32 kg/(m^2).   Repeat blood pressure 130/82 No pedal edema  Assesment:   1. Diabetes type 2, uncontrolled - 250.02  The patient's diabetes control appears to be poorly controlled and worse than before, primarily from noncompliance with all aspects of self-care  Problems identified are as follows   Difficult to assess her home readings as she does not check regularly and did not bring any records for review  Poor compliance with diet variable eating patterns  and drinking regular drinks with sugar daily including at night. She claims that she is allergic to artificial sweeteners and will not drink other drinks  Probably noncompliant with mealtime dosing as she tends to forget  Not clear if she is changing her pump regularly  Not clear what insulin she is taking and may be still taking her previous 70/30 insulin  Appears to have higher readings overall as judged by her recall but will need to check her labs to  confirm  Noncompliance with regular followup  She has been recommended to a higher basal rate for her pump but she does not want to change  Not taking any boluses for high readings    2. Symptoms of neuropathy: She says she is having tingling and discomfort in her fingers now and not as much in her feet but has not been on any treatment She thinks she has taken there, but is concerned about the potential weight gain Will start her on gabapentin empirically.  3. HYPERTENSION: Currently taking only Aldactone, does have a history of hypokalemia and potassium needs to be rechecked Consider adding low-dose ramipril  Counseling time over 50% of today's 25 minute visit  Jovonte Commins 07/22/2013, 11:50 AM

## 2013-07-22 NOTE — Telephone Encounter (Signed)
Pharmacy said she is on Novolog 70/30, supposedly with the V-Go which they said has not been filled since the beginning of May.

## 2013-07-23 ENCOUNTER — Telehealth: Payer: Self-pay | Admitting: *Deleted

## 2013-07-23 ENCOUNTER — Other Ambulatory Visit: Payer: Self-pay | Admitting: *Deleted

## 2013-07-23 MED ORDER — V-GO 40 KIT: 1.0000 | PACK | Freq: Every day | Status: DC

## 2013-07-23 NOTE — Telephone Encounter (Signed)
Message copied by Roxanna Mew on Tue Jul 23, 2013  4:14 PM ------      Message from: Elayne Snare      Created: Mon Jul 22, 2013  9:58 PM       Blood sugars are extremely poorly controlled with lab glucose over 400, she needs to be referred to diabetes education program for review of her compliance and needs to keep a record of blood sugars in the diary, to check twice a day and no further drinks with sugar ------

## 2013-07-23 NOTE — Telephone Encounter (Signed)
Noted, pt is aware and rx has been sent

## 2013-07-25 ENCOUNTER — Telehealth: Payer: Self-pay | Admitting: *Deleted

## 2013-07-25 NOTE — Telephone Encounter (Signed)
Pt said she checked her blood sugar and it would not register on her meter, it said it was HIGH, she wants to know what she should do?

## 2013-07-25 NOTE — Telephone Encounter (Signed)
Left message on voicemail to return call.

## 2013-07-25 NOTE — Telephone Encounter (Signed)
Has she put on a new 40 unit pump today? Which insulin if she using? Has she been avoiding regular soft drinks?  If she has been using the pump daily for the last 2 days as directed we will need to give her additional Humulin 70/30 insulin, 20 units twice a day

## 2013-08-12 ENCOUNTER — Ambulatory Visit (INDEPENDENT_AMBULATORY_CARE_PROVIDER_SITE_OTHER): Payer: Medicare Other | Admitting: Endocrinology

## 2013-08-12 ENCOUNTER — Encounter: Payer: Self-pay | Admitting: Endocrinology

## 2013-08-12 VITALS — BP 130/80 | HR 94 | Temp 98.0°F | Resp 12 | Ht 65.0 in | Wt 192.2 lb

## 2013-08-12 DIAGNOSIS — E1142 Type 2 diabetes mellitus with diabetic polyneuropathy: Secondary | ICD-10-CM

## 2013-08-12 DIAGNOSIS — I10 Essential (primary) hypertension: Secondary | ICD-10-CM

## 2013-08-12 DIAGNOSIS — E1149 Type 2 diabetes mellitus with other diabetic neurological complication: Secondary | ICD-10-CM

## 2013-08-12 LAB — GLUCOSE, CAPILLARY: POC Glucose: 375 mg/dl — AB (ref 70–99)

## 2013-08-12 NOTE — Patient Instructions (Addendum)
Change V-go to the 40 unit pump; will need to use 3-4 clicks for sugar> [id number] PLUS 6 CLICKS FOR FOOD  Please check blood sugars  about 2 hours after any meal and DAILY on waking up. USE 2X DAILY Please bring blood sugar monitor to each visit  NO MORE REGULAR SODAS  LANTUS 20 UNITS DAILY AND GO UP OR DOWN 5 UNITS IF SUGAR STAAYS OVER 200 OR GOES BELOW 100

## 2013-08-12 NOTE — Progress Notes (Signed)
Patient ID: [name], female   DOB: 01-30-1966, 47 y.o.   MRN: [id number]  Reason for Appointment: Diabetes follow-up   History of Present Illness     Diagnosis: Type 2 diabetes mellitus, date of diagnosis: 2004.  PAST history: She has been treated mostly with insulin since about a year after diagnosis. She has had difficulty with consistent compliance with diet and also compliance with self care including glucose monitoring over the years. She had been mostly treated with basal insulin. Also had been tried on mealtime insulin but she would be noncompliant with this. Was tried on Prandin for mealtime control but difficult to judge efficacy because of lack of postprandial monitoring. Was given Victoza to start in 2011 but did not follow up after this.She was tried on premixed insulin but this did not help her control, mostly because of noncompliance with the doses.   RECENT history:  She has been using the V- go pump and was doing fairly well initially with starting this. She has had frequent education visits also. She was previously doing her boluses with meals. However over the last few weeks she has been totally noncompliant with her diet especially with drinking regular soft drinks. She thinks she is drinking 4 L of regular soft drinks a day. Also has not checked her blood sugar very much and she thinks they are generally over 300 On her last visit she was told to be better compliant with changing her infusion pump every day and to use the 40 unit pump. She claims that she is doing this and her blood sugars are only slightly better She does however eat very irregularly and has no fixed meals History obtained from the pharmacy indicates that she has not refilled her pump supplies for the last 2 months She says she is less thirsty .   The insulin regimen is described as 12 units bolus at mealtime With the V-Go pump   Does not use a correction dose for high readings.  .  Complications: are:  peripheral neuropathy.  Diabetes associated symptoms: Fatigue, Polydipsia.  Proper timing of medications in relation to meals: No, takes Her boluses after eating at times.  Monitors blood glucose: off and on  Glucometer: [employer] brand.  Blood Glucose readings: 300+ in afternoon  Hypoglycemia frequency: Never.   Food preferences: eating one or 2 meals per day .  Physical activity: exercise: Minimal.  Certified Diabetes Educator visit: Most recent:, 2/14.  The diet that the patient has been following is no real diet And eating at irregular times, no balanced meals.  The last HbgA1c was reported as 12.0 in 7/14, previously 11.9  The microalbumin has been tested , and the result is 41.   Hypertension:  Here for hypertension F/U BP is well-controlled , patient is on Aldactone from PCP    NEUROPATHY: She says she is having tingling and discomfort in her fingers  and not as much in her feet but has not been on any treatment She thinks she has taken Lyrica before, but is concerned about the potential weight gain  DEPRESSION/decreased memory: She says she is going to be seen by a neurologist. Likely not compliant with her medications because of forgetfulness. She does admit to lack of motivation     No visits with results within 1 Week(s) from this visit. Latest known visit with results is:  Office Visit on 07/22/2013  Component Date Value Range Status  . Hemoglobin A1C 07/22/2013 12.0* 4.6 - 6.5 %  Final   Glycemic Control Guidelines for People with Diabetes:Non Diabetic:  <6%Goal of Therapy: <7%Additional Action Suggested:  >8%   . Sodium 07/22/2013 130* 135 - 145 mEq/L Final  . Potassium 07/22/2013 3.7  3.5 - 5.1 mEq/L Final  . Chloride 07/22/2013 90* 96 - 112 mEq/L Final  . CO2 07/22/2013 27  19 - 32 mEq/L Final  . Glucose, Bld 07/22/2013 459* 70 - 99 mg/dL Final  . BUN 07/22/2013 4* 6 - 23 mg/dL Final  . Creatinine, Ser 07/22/2013 0.7  0.4 - 1.2 mg/dL Final  . Total Bilirubin  07/22/2013 0.3  0.3 - 1.2 mg/dL Final  . Alkaline Phosphatase 07/22/2013 89  39 - 117 U/L Final  . AST 07/22/2013 14  0 - 37 U/L Final  . ALT 07/22/2013 9  0 - 35 U/L Final  . Total Protein 07/22/2013 6.8  6.0 - 8.3 g/dL Final  . Albumin 07/22/2013 3.7  3.5 - 5.2 g/dL Final  . Calcium 07/22/2013 10.0  8.4 - 10.5 mg/dL Final  . GFR 07/22/2013 93.32  >60.00 mL/min Final  . Cholesterol 07/22/2013 182  0 - 200 mg/dL Final   ATP III Classification       Desirable:  < 200 mg/dL               Borderline High:  200 - 239 mg/dL          High:  > = 240 mg/dL  . Triglycerides 07/22/2013 636.0* 0.0 - 149.0 mg/dL Final   Normal:  <150 mg/dLBorderline High:  150 - 199 mg/dLTriglyceride is over 400; calculations on Lipids are invalid.  [name] HDL 07/22/2013 38.60* >39.00 mg/dL Final  . VLDL 07/22/2013 127.2* 0.0 - 40.0 mg/dL Final  . Total CHOL/HDL Ratio 07/22/2013 5   Final                  Men          Women1/2 Average Risk     3.4          3.3Average Risk          5.0          4.42X Average Risk          9.6          7.13X Average Risk          15.0          11.0                      . Direct LDL 07/22/2013 68.2   Final   Optimal:  <100 mg/dLNear or Above Optimal:  100-129 mg/dLBorderline High:  130-159 mg/dLHigh:  160-189 mg/dLVery High:  >190 mg/dL      Medication List       This list is accurate as of: 08/12/13  3:02 PM.  Always use your most recent med list.               alprazolam 2 MG tablet  Commonly known as:  XANAX  Take 2 mg by mouth at bedtime as needed for sleep. For anxiety     amphetamine-dextroamphetamine 20 MG tablet  Commonly known as:  ADDERALL  Take 20 mg by mouth daily before breakfast.     clonazePAM 2 MG tablet  Commonly known as:  KLONOPIN  Take 2 mg by mouth at bedtime as needed. For sleep     diazepam 10 MG tablet  Commonly known as:  VALIUM  Take 20 mg by mouth at bedtime as needed. For sleep     doxepin 75 MG capsule  Commonly known as:  SINEQUAN  Take 150 mg  by mouth at bedtime.     esomeprazole 40 MG capsule  Commonly known as:  NEXIUM  Take 40 mg by mouth at bedtime.     FETZIMA 40 MG Cp24  Generic drug:  Levomilnacipran HCl ER     gabapentin 300 MG capsule  Commonly known as:  NEURONTIN  Take 1 capsule (300 mg total) by mouth 3 (three) times daily.     HYDROcodone-acetaminophen 5-325 MG per tablet  Commonly known as:  NORCO/VICODIN     insulin aspart 100 UNIT/ML injection  Commonly known as:  novoLOG  To use with V-Go Pump     MAGNESIUM PO  Take 1 tablet by mouth daily.     Oxcarbazepine 300 MG tablet  Commonly known as:  TRILEPTAL  Take 900 mg by mouth at bedtime.     oxycodone 5 MG capsule  Commonly known as:  OXY-IR  Take 5 mg by mouth every 4 (four) hours as needed for pain.     oxyCODONE-acetaminophen 10-325 MG per tablet  Commonly known as:  PERCOCET  Take 1 tablet by mouth every 4 (four) hours as needed for pain.     potassium chloride SA 20 MEQ tablet  Commonly known as:  K-DUR,KLOR-CON  Take 20 mEq by mouth 2 (two) times daily.     ROZEREM 8 MG tablet  Generic drug:  ramelteon  Take 16 mg by mouth at bedtime.     spironolactone 100 MG tablet  Commonly known as:  ALDACTONE  Take 100 mg by mouth daily.     sulfamethoxazole-trimethoprim 800-160 MG per tablet  Commonly known as:  BACTRIM DS,SEPTRA DS  Take 1 tablet by mouth 2 (two) times daily.     V-GO 40 Kit  1 kit by Does not apply route daily.        Allergies:  Allergies  Allergen Reactions  . Latex Other (See Comments)    Pt states she cannot use condoms - cause an infection.  Use of latex on skin is okay  . Ibuprofen Other (See Comments)    headaches  . Sweetening Enhancer [Flavoring Agent] Nausea And Vomiting    And headaches  . Trazodone And Nefazodone Other (See Comments)    Hallucinations   . Triazolam Other (See Comments)    hallucinations    Past Medical History  Diagnosis Date  . Hypertension   . Arthritis   . Anxiety   .  Sleep apnea     didn't tolerate cpap  . Hyperlipemia   . GERD (gastroesophageal reflux disease)   . Insomnia   . SUI (stress urinary incontinence, female) S/P SLING 12-29-2011  . SOB (shortness of breath) on exertion   . Diabetes mellitus   . Blood transfusion without reported diagnosis   . Mental disorder     bipolar  . Depression     Past Surgical History  Procedure Laterality Date  . Knee arthroscopy w/ allograft impant      LEFT KNEE, graft x 2  . Foot surgery      bilateral  . Anterior cervical decomp/discectomy fusion  2000    C6 - 7  . Pubovaginal sling  12/29/2011    Procedure: Gaynelle Arabian;  Surgeon: Reece Packer, MD;  Location: [facility];  Service: Urology;  Laterality: N/A;  cysto and sparc sling   . Lumbar fusion    . Total abdominal hysterectomy w/ bilateral salpingoophorectomy  1997  . Multiple laparoscopies for endometriosis    . Cesarean section      X2  . Repeat reconstruction acl left knee/ screws removed  03-28-2000    CADAVER GRAFT  . Reconsturction of congenital uterus anomaly  AGE 34  . Knee surgery      TOTAL 8 SURG'S  . Cardiac catheterization  05-22-2008   DR SKAINS    NO SIGNIFECANT CAD/ NORMAL LV/  EF 65%/  NO WALL MOTION ABNORMALITIES  . Cystoscopy with injection  05/04/2012    Procedure: CYSTOSCOPY WITH INJECTION;  Surgeon: Reece Packer, MD;  Location: [facility];  Service: Urology;  Laterality: N/A;  MACROPLASTIQUE INJECTION  . Cystoscopy with injection  08/28/2012    Procedure: CYSTOSCOPY WITH INJECTION;  Surgeon: Reece Packer, MD;  Location: [facility];  Service: Urology;  Laterality: N/A;  cysto and macroplastique   . Right carpal tunnel surgery  04-25-2013  . Cysto N/A 04/30/2013    Procedure: CYSTOSCOPY;  Surgeon: Reece Packer, MD;  Location: [facility];  Service: Urology;  Laterality: N/A;  . Pubovaginal sling N/A 04/30/2013    Procedure: REMOVAL OF VAGINAL MESH;  Surgeon:  Reece Packer, MD;  Location: [facility];  Service: Urology;  Laterality: N/A;    No family history on file.  Social History:  reports that she quit smoking about 9 months ago. Her smoking use included Cigarettes. She has a 105 pack-year smoking history. She has never used smokeless tobacco. She reports that she does not drink alcohol or use illicit drugs.  Review of Systems -   She has history of high triglycerides, last level was 319 and is supposed to be taking fenofibrate. Her last LDL was 73  No recent pedal edema  Long-standing history of depression  Recent LDL was 68   Examination:   BP 130/80  Pulse 94  Temp(Src) 98 F (36.7 C)  Resp 12  Ht 5\' 5"  (1.651 m)  Wt 192 lb 3.2 oz (87.181 kg)  BMI 31.98 kg/m2  SpO2 99%  Body mass index is 31.98 kg/(m^2).   No pedal edema  Assesment/Plan:   1. Diabetes type 2, uncontrolled - 250.02  The patient's diabetes control is still poorly controlled  primarily from noncompliance with all aspects of self-care  Problems identified are as follows   Difficult to assess her home readings as she does not check regularly and did not bring any records for review. Reminded her to check her blood sugars at least twice a day  Poor compliance with diet variable eating patterns and drinking regular drinks with sugar daily including at night. This is despite reminders for her to change and use water. She claims that she is allergic to artificial sweeteners and will not drink other drinks  Probably noncompliant with mealtime boluses as she tends to forget  Not clear if she is changing her pump regularly  She has been recommended adding Lantus insulin to help with blood sugar control especially overnight but she refuses. However have given her a sample pen to start with 20 units  Not taking any boluses for high readings and she was told to start doing this  HYPERTENSION: Better controlled today  Counseling time over 50% of today's 25  minute visit  Darionna Banke 08/12/2013, 3:02 PM

## 2013-08-23 ENCOUNTER — Other Ambulatory Visit: Payer: Medicare Other

## 2013-08-28 ENCOUNTER — Ambulatory Visit: Payer: Medicare Other | Admitting: Endocrinology

## 2013-09-04 ENCOUNTER — Ambulatory Visit: Payer: Medicare Other | Admitting: Endocrinology

## 2013-09-24 ENCOUNTER — Other Ambulatory Visit (INDEPENDENT_AMBULATORY_CARE_PROVIDER_SITE_OTHER): Payer: Medicare Other

## 2013-09-24 DIAGNOSIS — E1149 Type 2 diabetes mellitus with other diabetic neurological complication: Secondary | ICD-10-CM

## 2013-09-24 LAB — BASIC METABOLIC PANEL
CO2: 28 mEq/L (ref 19–32)
Calcium: 9.1 mg/dL (ref 8.4–10.5)
Creatinine, Ser: 0.6 mg/dL (ref 0.4–1.2)
GFR: 105.12 mL/min (ref >60.00)
Sodium: 134 mEq/L — ABNORMAL LOW (ref 135–145)

## 2013-09-24 LAB — URINALYSIS, ROUTINE W REFLEX MICROSCOPIC
Hgb urine dipstick: NEGATIVE
Ketones, ur: NEGATIVE
Leukocytes, UA: NEGATIVE
Specific Gravity, Urine: <=1.005 (ref 1.000–1.030)
Urine Glucose: >=1000
Urobilinogen, UA: 0.2 (ref 0.0–1.0)

## 2013-09-24 LAB — FRUCTOSAMINE: Fructosamine: 377 umol/L — ABNORMAL HIGH (ref <285)

## 2013-09-25 ENCOUNTER — Ambulatory Visit: Payer: Medicare Other | Admitting: Endocrinology

## 2013-09-26 ENCOUNTER — Telehealth: Payer: Self-pay | Admitting: *Deleted

## 2013-09-26 NOTE — Telephone Encounter (Signed)
Message copied by Roxanna Mew on Thu Sep 26, 2013  4:36 PM ------      Message from: Elayne Snare      Created: Tue Sep 24, 2013 11:32 AM       Needs to be seen this week, also see Vaughan Basta ------

## 2013-09-26 NOTE — Telephone Encounter (Signed)
Left message to return call 

## 2013-09-30 ENCOUNTER — Telehealth: Payer: Self-pay | Admitting: *Deleted

## 2013-09-30 NOTE — Telephone Encounter (Signed)
Left message to return call 

## 2013-09-30 NOTE — Telephone Encounter (Signed)
Message copied by Roxanna Mew on Mon Sep 30, 2013  4:58 PM ------      Message from: Elayne Snare      Created: Tue Sep 24, 2013 11:32 AM       Needs to be seen this week, also see Vaughan Basta ------

## 2013-10-01 ENCOUNTER — Telehealth: Payer: Self-pay | Admitting: *Deleted

## 2013-10-01 NOTE — Telephone Encounter (Signed)
Left message to return call 

## 2013-10-01 NOTE — Telephone Encounter (Signed)
Message copied by Roxanna Mew on Tue Oct 01, 2013  9:01 AM ------      Message from: Elayne Snare      Created: Tue Sep 24, 2013 11:32 AM       Needs to be seen this week, also see Vaughan Basta ------

## 2013-10-02 ENCOUNTER — Encounter: Payer: Self-pay | Admitting: Endocrinology

## 2013-10-02 ENCOUNTER — Ambulatory Visit (INDEPENDENT_AMBULATORY_CARE_PROVIDER_SITE_OTHER): Payer: Medicare Other | Admitting: Endocrinology

## 2013-10-02 VITALS — BP 118/84 | HR 100 | Temp 98.3°F | Resp 12 | Ht 65.0 in | Wt 187.0 lb

## 2013-10-02 DIAGNOSIS — E1149 Type 2 diabetes mellitus with other diabetic neurological complication: Secondary | ICD-10-CM

## 2013-10-02 DIAGNOSIS — E876 Hypokalemia: Secondary | ICD-10-CM

## 2013-10-02 MED ORDER — FLUCONAZOLE 150 MG PO TABS: 150.0000 mg | ORAL_TABLET | Freq: Once | ORAL | Status: DC

## 2013-10-02 MED ORDER — DAPAGLIFLOZIN PROPANEDIOL 5 MG PO TABS: 5.0000 mg | ORAL_TABLET | Freq: Every day | ORAL | Status: DC

## 2013-10-02 NOTE — Progress Notes (Signed)
Patient ID: [name], female   DOB: Apr 16, 1966, 47 y.o.   MRN: [id number]  Reason for Appointment: Diabetes follow-up   History of Present Illness   Diagnosis: Type 2 diabetes mellitus, date of diagnosis: 2004.  PAST history: She has been treated mostly with insulin since about a year after diagnosis. She has had difficulty with consistent compliance with diet and also compliance with self care including glucose monitoring over the years. She had been mostly treated with basal insulin. Also had been tried on mealtime insulin but she would be noncompliant with this. Was tried on Prandin for mealtime control but difficult to judge efficacy because of lack of postprandial monitoring. Was given Victoza to start in 2011 but did not follow up after this.She was tried on premixed insulin but this did not help her control, mostly because of noncompliance with the doses.   RECENT history:  She has been using the V- go pump and had better control initially and was well compliant with the daily routine and boluses. She has had frequent education visits also.   However over the last few months she has been  noncompliant with all aspects of her self-care. Pharmacy records checked previously showed that she was not getting her supplies regularly for the pump. She is still drinking regular soft drinks.    Difficult to assess her home readings as she does not check regularly and did not bring any records for review. Reminded her to check her blood sugars at least twice a day, either before or after meals  Poor compliance with diet variable eating patterns and drinking regular drinks with sugar daily including at night. This is despite reminders for her to change and use non-sweetened drinks. She will try to favor her water with lemon or other natural substances. She has already discussed these issues with nurse educators and dietitians  Frequently noncompliant with mealtime boluses as she tends to  forget  Not clear if she is changing her pump regularly although she claims she is doing this  She has been recommended adding Lantus insulin to help with blood sugar control especially overnight but she did not start as discussed on her last visit. Explained to her that blood sugars are not going to improve unless she starts using the Lantus in addition to her pump as well as doing the boluses frequently.  Not doing additional boluses when the blood sugars are high and explained that she needs to do this regardless of food intake    The insulin regimen is described as 12 units bolus at mealtime or 3 clicks with snacks; not daily  With the V-Go-40 pump   .Glucometer: [employer] brand.  Blood Glucose readings: 300-400, checking sporadically and not keeping a record Hypoglycemia frequency: Never.    Food preferences: eating one or 2 meals per day .  Physical activity: exercise: Minimal.  Certified Diabetes Educator visit: Most recent:, 2/14.  The diet that the patient has been following is no real diet And eating at irregular times, no balanced meals. Eating mostly soft foods and some meat since she has difficulty chewing  The last HbgA1c:  Lab Results  Component Value Date   HGBA1C 12.0* 99991111    Complications: are: peripheral neuropathy. The microalbumin has been tested , and the result is 41.   Labs:  No visits with results within 1 Week(s) from this visit. Latest known visit with results is:  Appointment on 09/24/2013  Component Date Value Range Status  . Fructosamine  09/24/2013 377* <285 umol/L Final   Comment:                            Variations in levels of serum proteins (albumin and immunoglobulins)                          may affect fructosamine results.                             . Sodium 09/24/2013 134* 135 - 145 mEq/L Final  . Potassium 09/24/2013 3.4* 3.5 - 5.1 mEq/L Final  . Chloride 09/24/2013 95* 96 - 112 mEq/L Final  . CO2 09/24/2013 28  19 - 32 mEq/L  Final  . Glucose, Bld 09/24/2013 416* 70 - 99 mg/dL Final  . BUN 09/24/2013 5* 6 - 23 mg/dL Final  . Creatinine, Ser 09/24/2013 0.6  0.4 - 1.2 mg/dL Final  . Calcium 09/24/2013 9.1  8.4 - 10.5 mg/dL Final  . GFR 09/24/2013 105.12  >60.00 mL/min Final  . Color, Urine 09/24/2013 LT. YELLOW  Yellow;Lt. Yellow Final  . APPearance 09/24/2013 CLEAR  Clear Final  . Specific Gravity, Urine 09/24/2013 <=1.005  1.000 - 1.030 Final  . pH 09/24/2013 7.0  5.0 - 8.0 Final  . Total Protein, Urine 09/24/2013 NEGATIVE  Negative Final  . Urine Glucose 09/24/2013 >=1000  Negative Final  . Ketones, ur 09/24/2013 NEGATIVE  Negative Final  . Bilirubin Urine 09/24/2013 NEGATIVE  Negative Final  . Hgb urine dipstick 09/24/2013 NEGATIVE  Negative Final  . Urobilinogen, UA 09/24/2013 0.2  0.0 - 1.0 Final  . Leukocytes, UA 09/24/2013 NEGATIVE  Negative Final  . Nitrite 09/24/2013 NEGATIVE  Negative Final  . WBC, UA 09/24/2013 3-6/hpf  0-2/hpf Final  . RBC / HPF 09/24/2013 0-2/hpf  0-2/hpf Final  . Squamous Epithelial / LPF 09/24/2013 Few(5-10/hpf)  Rare(0-4/hpf) Final  . Bacteria, UA 09/24/2013 Rare(<10/hpf)  None Final      Medication List       This list is accurate as of: 10/02/13  2:42 PM.  Always use your most recent med list.               alprazolam 2 MG tablet  Commonly known as:  XANAX  Take 2 mg by mouth at bedtime as needed for sleep. For anxiety     amphetamine-dextroamphetamine 20 MG tablet  Commonly known as:  ADDERALL  Take 20 mg by mouth daily before breakfast.     clonazePAM 2 MG tablet  Commonly known as:  KLONOPIN  Take 2 mg by mouth at bedtime as needed. For sleep     diazepam 10 MG tablet  Commonly known as:  VALIUM  Take 20 mg by mouth at bedtime as needed. For sleep     doxepin 75 MG capsule  Commonly known as:  SINEQUAN  Take 150 mg by mouth at bedtime.     esomeprazole 40 MG capsule  Commonly known as:  NEXIUM  Take 40 mg by mouth at bedtime.     FETZIMA 40 MG  Cp24  Generic drug:  Levomilnacipran HCl ER     gabapentin 300 MG capsule  Commonly known as:  NEURONTIN  Take 1 capsule (300 mg total) by mouth 3 (three) times daily.     HYDROcodone-acetaminophen 5-325 MG per tablet  Commonly known as:  NORCO/VICODIN  insulin aspart 100 UNIT/ML injection  Commonly known as:  novoLOG  To use with V-Go Pump     MAGNESIUM PO  Take 1 tablet by mouth daily.     Oxcarbazepine 300 MG tablet  Commonly known as:  TRILEPTAL  Take 900 mg by mouth at bedtime.     oxycodone 5 MG capsule  Commonly known as:  OXY-IR  Take 5 mg by mouth every 4 (four) hours as needed for pain.     oxyCODONE-acetaminophen 10-325 MG per tablet  Commonly known as:  PERCOCET  Take 1 tablet by mouth every 4 (four) hours as needed for pain.     potassium chloride SA 20 MEQ tablet  Commonly known as:  K-DUR,KLOR-CON  Take 20 mEq by mouth 2 (two) times daily.     ROZEREM 8 MG tablet  Generic drug:  ramelteon  Take 16 mg by mouth at bedtime.     spironolactone 100 MG tablet  Commonly known as:  ALDACTONE  Take 100 mg by mouth daily.     sulfamethoxazole-trimethoprim 800-160 MG per tablet  Commonly known as:  BACTRIM DS,SEPTRA DS  Take 1 tablet by mouth 2 (two) times daily.     V-GO 40 Kit  1 kit by Does not apply route daily.        Allergies:  Allergies  Allergen Reactions  . Latex Other (See Comments)    Pt states she cannot use condoms - cause an infection.  Use of latex on skin is okay  . Ibuprofen Other (See Comments)    headaches  . Sweetening Enhancer [Flavoring Agent] Nausea And Vomiting    And headaches  . Trazodone And Nefazodone Other (See Comments)    Hallucinations   . Triazolam Other (See Comments)    hallucinations    Past Medical History  Diagnosis Date  . Hypertension   . Arthritis   . Anxiety   . Sleep apnea     didn't tolerate cpap  . Hyperlipemia   . GERD (gastroesophageal reflux disease)   . Insomnia   . SUI (stress  urinary incontinence, female) S/P SLING 12-29-2011  . SOB (shortness of breath) on exertion   . Diabetes mellitus   . Blood transfusion without reported diagnosis   . Mental disorder     bipolar  . Depression     Past Surgical History  Procedure Laterality Date  . Knee arthroscopy w/ allograft impant      LEFT KNEE, graft x 2  . Foot surgery      bilateral  . Anterior cervical decomp/discectomy fusion  2000    C6 - 7  . Pubovaginal sling  12/29/2011    Procedure: Gaynelle Arabian;  Surgeon: Reece Packer, MD;  Location: [facility];  Service: Urology;  Laterality: N/A;  cysto and sparc sling   . Lumbar fusion    . Total abdominal hysterectomy w/ bilateral salpingoophorectomy  1997  . Multiple laparoscopies for endometriosis    . Cesarean section      X2  . Repeat reconstruction acl left knee/ screws removed  03-28-2000    CADAVER GRAFT  . Reconsturction of congenital uterus anomaly  AGE 89  . Knee surgery      TOTAL 8 SURG'S  . Cardiac catheterization  05-22-2008   DR SKAINS    NO SIGNIFECANT CAD/ NORMAL LV/  EF 65%/  NO WALL MOTION ABNORMALITIES  . Cystoscopy with injection  05/04/2012    Procedure: CYSTOSCOPY WITH INJECTION;  Surgeon: Reece Packer, MD;  Location: [facility];  Service: Urology;  Laterality: N/A;  MACROPLASTIQUE INJECTION  . Cystoscopy with injection  08/28/2012    Procedure: CYSTOSCOPY WITH INJECTION;  Surgeon: Reece Packer, MD;  Location: [facility];  Service: Urology;  Laterality: N/A;  cysto and macroplastique   . Right carpal tunnel surgery  04-25-2013  . Cysto N/A 04/30/2013    Procedure: CYSTOSCOPY;  Surgeon: Reece Packer, MD;  Location: [facility];  Service: Urology;  Laterality: N/A;  . Pubovaginal sling N/A 04/30/2013    Procedure: REMOVAL OF VAGINAL MESH;  Surgeon: Reece Packer, MD;  Location: [facility];  Service: Urology;  Laterality: N/A;    No family history on file.  Social History:   reports that she quit smoking about a year ago. Her smoking use included Cigarettes. She has a 105 pack-year smoking history. She has never used smokeless tobacco. She reports that she does not drink alcohol or use illicit drugs.  Review of Systems -   She has history of high triglycerides, last level was 319 and is supposed to be taking fenofibrate. Her last LDL was 73  Hypertension:  Here for hypertension F/U BP is well-controlled , patient is on Aldactone from PCP    NEUROPATHY: She says she is having tingling and discomfort in her fingers  and not as much in her feet but has not been on any treatment She thinks she has taken Lyrica before, but is concerned about the potential weight gain  DEPRESSION/decreased memory: She says she is going to be seen by a neurologist. Likely not compliant with her medications because of forgetfulness. She does admit to lack of motivation  No recent pedal edema   Examination:   Wt 187 lb (84.823 kg)  BMI 31.12 kg/m2  Body mass index is 31.12 kg/(m^2).   No pedal edema  Assesment/Plan:   1. Diabetes type 2, uncontrolled - 250.02  The patient's diabetes control is again poorly controlled  from noncompliance with all aspects of self-care and inadequate insulin  Problems identified are as follows:   Difficult to assess her home readings as she does not check regularly and did not bring any records for review. Reminded her to check her blood sugars at least twice a day, either before or after meals  Poor compliance with diet variable eating patterns and drinking regular drinks with sugar daily including at night. This is despite reminders for her to change and use non-sweetened drinks. She will try to favor her water with lemon or other natural substances. She has already discussed these issues with nurse educators and dietitians  Frequently noncompliant with mealtime boluses as she tends to forget  Not clear if she is changing her pump regularly  although she claims she is doing this  She has been recommended adding Lantus insulin to help with blood sugar control especially overnight but she did not start as discussed on her last visit. Explained to her that blood sugars are not going to improve unless she starts using the Lantus in addition to her pump as well as doing the boluses frequently.  Not doing additional boluses when the blood sugars are high and explained that she needs to do this regardless of food intake   Will also give her a trial of Farxiga 5 mg daily since this should have nonspecific benefit on her blood sugars as well as insulin requirement and weight. Discussed how this works, timing of medication,  possible side effects and effects on sugar. She will keep a prescription for Diflucan handy in case she has yeast infection  HYPERTENSION:  well controlled today  Hypokalemia: Still present and may be partly related to her hyperglycemia and also to hypomagnesemia. She will take her potassium daily and also start medication daily  Counseling time over 50% of today's 25 minute visit  Rajah Lamba 10/02/2013, 2:42 PM

## 2013-10-02 NOTE — Patient Instructions (Addendum)
No drinks with sugar  Lantus 10 units daily   Farxiga 5mg  daily in am  Do clicks with pump 3x daily for sugars > [id number] (3-4 clicks)  Potassium daily  Magnesium OTC daily  Please check blood sugars at least half the time about 2 hours after any meal and daily on waking up. Please bring blood sugar monitor to each visit

## 2013-10-08 ENCOUNTER — Other Ambulatory Visit: Payer: Self-pay | Admitting: *Deleted

## 2013-10-08 MED ORDER — CANAGLIFLOZIN 100 MG PO TABS: 100.0000 mg | ORAL_TABLET | Freq: Every day | ORAL | Status: DC

## 2013-10-09 ENCOUNTER — Telehealth: Payer: Self-pay | Admitting: Endocrinology

## 2013-10-09 NOTE — Telephone Encounter (Signed)
Pt needed a PCP, gave her the number to [facility]

## 2013-10-15 ENCOUNTER — Telehealth: Payer: Self-pay | Admitting: *Deleted

## 2013-10-15 NOTE — Telephone Encounter (Signed)
Pt has been [facility] to a behavioral hospital and they do not allow the V-go (insulin pumps) pharmacists wants to know how to dispense her insulin. Please advise CB # for [name] is [phone], or Cell at [phone], okay to leave a message on voicemail with instructions.

## 2013-10-15 NOTE — Telephone Encounter (Signed)
She can take Levemir 40 units in the morning and NovoLog 12 units before meals, called

## 2013-10-16 NOTE — Telephone Encounter (Signed)
noted 

## 2013-10-31 ENCOUNTER — Other Ambulatory Visit: Payer: Self-pay | Admitting: *Deleted

## 2013-10-31 MED ORDER — SPIRONOLACTONE 100 MG PO TABS: 100.0000 mg | ORAL_TABLET | Freq: Every day | ORAL | Status: DC

## 2013-11-06 ENCOUNTER — Other Ambulatory Visit (INDEPENDENT_AMBULATORY_CARE_PROVIDER_SITE_OTHER): Payer: Medicare Other

## 2013-11-06 DIAGNOSIS — E1149 Type 2 diabetes mellitus with other diabetic neurological complication: Secondary | ICD-10-CM

## 2013-11-06 LAB — COMPREHENSIVE METABOLIC PANEL
ALT: 14 U/L (ref 0–35)
CO2: 24 mEq/L (ref 19–32)
Calcium: 9.3 mg/dL (ref 8.4–10.5)
Chloride: 98 mEq/L (ref 96–112)
GFR: 62.78 mL/min (ref >60.00)
Glucose, Bld: 168 mg/dL — ABNORMAL HIGH (ref 70–99)
Sodium: 133 mEq/L — ABNORMAL LOW (ref 135–145)
Total Protein: 7.2 g/dL (ref 6.0–8.3)

## 2013-11-06 LAB — HEMOGLOBIN A1C: Hgb A1c MFr Bld: 8.8 % — ABNORMAL HIGH (ref 4.6–6.5)

## 2013-11-13 ENCOUNTER — Encounter: Payer: Self-pay | Admitting: Endocrinology

## 2013-11-13 ENCOUNTER — Other Ambulatory Visit: Payer: Self-pay | Admitting: *Deleted

## 2013-11-13 ENCOUNTER — Ambulatory Visit (INDEPENDENT_AMBULATORY_CARE_PROVIDER_SITE_OTHER): Payer: Medicare Other | Admitting: Endocrinology

## 2013-11-13 VITALS — BP 122/84 | HR 97 | Temp 98.5°F | Resp 12 | Ht 65.0 in | Wt 193.7 lb

## 2013-11-13 DIAGNOSIS — E785 Hyperlipidemia, unspecified: Secondary | ICD-10-CM

## 2013-11-13 DIAGNOSIS — E876 Hypokalemia: Secondary | ICD-10-CM

## 2013-11-13 DIAGNOSIS — E1142 Type 2 diabetes mellitus with diabetic polyneuropathy: Secondary | ICD-10-CM

## 2013-11-13 LAB — GLUCOSE, POCT (MANUAL RESULT ENTRY): POC Glucose: 143 mg/dl — AB (ref 70–99)

## 2013-11-13 MED ORDER — V-GO 20 KIT: PACK | Status: DC

## 2013-11-13 NOTE — Patient Instructions (Addendum)
Please check blood sugars at least half the time about 2 hours after any meal and daily on waking up. Please bring blood sugar monitor to each visit  No regular soft drinks  Change Pump to the V-Go-20 and use only 3-4 clicks for each meal and do it with meals and if sugar over 200 Use only clear Novolog in pump not 70/30  Gabapentin for leg pains as needed upto 4x daily

## 2013-11-13 NOTE — Progress Notes (Signed)
Patient ID: SNEZANA PRESBY, female   DOB: Apr 26, 1966, 47 y.o.   MRN: [id number]  Reason for Appointment: Diabetes follow-up   History of Present Illness:  Diagnosis: Type 2 diabetes mellitus, date of diagnosis: 2004.  PAST history: She has been treated mostly with insulin since about a year after diagnosis. She has had difficulty with consistent compliance with diet and also compliance with self care including glucose monitoring over the years. She had been mostly treated with basal insulin. Also had been tried on mealtime insulin but she would be noncompliant with this. Was tried on Prandin for mealtime control but difficult to judge efficacy because of lack of postprandial monitoring. Was given Victoza to start in 2011 but did not follow up after this.She was tried on premixed insulin but this did not help her control, mostly because of noncompliance with the doses. She has been using the V- go pump and had better control initially and was well compliant with the daily routine and boluses. She has had frequent education visits also.   RECENT history:   Although she has had poor control in the last 2 months with noncompliance with using her pump, drinking regular soft drinks and not checking her blood sugar she seems to have better readings recently Her A1c has improved from her prior levels of 11-12% She was hospitalized last month for her depression and given unknown regimen of insulin She is somewhat confused today and not giving an accurate history She thinks she started back on the pump only today because she was having some redness and tenderness at the site of the pump adhesive area on her abdomen. However she appears to be taking her Invokana regularly. This was started nearly 6 weeks ago although not clear when she picked up the prescription at the drug store.  She is still drinking regular soft drinks but also is trying to drink more water recently.  Did not bring any record of her blood  sugars and not clear if she is checking her readings The insulin regimen is unclear,  Glucometer: ? [employer] brand.  Blood Glucose readings: Unknown Hypoglycemia frequency: Never.    Food preferences: eating one or 2 meals per day .  Physical activity: exercise: Minimal.  Certified Diabetes Educator visit: Most recent:, 2/14.  The diet that the patient has been following is no real diet And eating at irregular times, no balanced meals. Eating mostly soft foods and some meat since she has difficulty chewing   DM labs:  Lab Results  Component Value Date   HGBA1C 8.8* 11/06/2013   HGBA1C 12.0* 07/22/2013   Lab Results  Component Value Date   CREATININE 1.0 [id number]   Complications: are: peripheral neuropathy. The microalbumin has been tested , and the result was 41.   Labs:  Office Visit on 11/13/2013  Component Date Value Range Status  . POC Glucose 11/13/2013 143* 70 - 99 mg/dl Final   fasting      Medication List       This list is accurate as of: 11/13/13 11:59 PM.  Always use your most recent med list.               alprazolam 2 MG tablet  Commonly known as:  XANAX  Take 2 mg by mouth at bedtime as needed for sleep. For anxiety     amitriptyline 50 MG tablet  Commonly known as:  ELAVIL     amphetamine-dextroamphetamine 20 MG tablet  Commonly known as:  ADDERALL  Take 20 mg by mouth daily before breakfast.     Canagliflozin 100 MG Tabs  Take 1 tablet (100 mg total) by mouth daily.     clonazePAM 2 MG tablet  Commonly known as:  KLONOPIN  Take 2 mg by mouth at bedtime as needed. For sleep     diazepam 10 MG tablet  Commonly known as:  VALIUM  Take 20 mg by mouth at bedtime as needed. For sleep     doxepin 75 MG capsule  Commonly known as:  SINEQUAN  Take 150 mg by mouth at bedtime.     doxycycline 100 MG tablet  Commonly known as:  VIBRA-TABS  100 mg.     esomeprazole 40 MG capsule  Commonly known as:  NEXIUM  Take 40 mg by mouth at  bedtime.     FETZIMA 40 MG Cp24  Generic drug:  Levomilnacipran HCl ER     fluconazole 150 MG tablet  Commonly known as:  DIFLUCAN  Take 1 tablet (150 mg total) by mouth once.     gabapentin 300 MG capsule  Commonly known as:  NEURONTIN  Take 1 capsule (300 mg total) by mouth 3 (three) times daily.     HYDROcodone-acetaminophen 5-325 MG per tablet  Commonly known as:  NORCO/VICODIN     insulin aspart 100 UNIT/ML injection  Commonly known as:  novoLOG  To use with V-Go 40 Pump     MAGNESIUM PO  Take 1 tablet by mouth daily.     mupirocin ointment 2 %  Commonly known as:  BACTROBAN     Oxcarbazepine 300 MG tablet  Commonly known as:  TRILEPTAL  Take 900 mg by mouth at bedtime.     oxycodone 5 MG capsule  Commonly known as:  OXY-IR  Take 5 mg by mouth every 4 (four) hours as needed for pain.     oxyCODONE-acetaminophen 10-325 MG per tablet  Commonly known as:  PERCOCET  Take 1 tablet by mouth every 4 (four) hours as needed for pain.     potassium chloride SA 20 MEQ tablet  Commonly known as:  K-DUR,KLOR-CON  Take 20 mEq by mouth 2 (two) times daily.     ROZEREM 8 MG tablet  Generic drug:  ramelteon  Take 16 mg by mouth at bedtime.     spironolactone 100 MG tablet  Commonly known as:  ALDACTONE  Take 1 tablet (100 mg total) by mouth daily.     V-GO 20 Kit  Use as directed        Allergies:  Allergies  Allergen Reactions  . Latex Other (See Comments)    Pt states she cannot use condoms - cause an infection.  Use of latex on skin is okay  . Ibuprofen Other (See Comments)    headaches  . Sweetening Enhancer [Flavoring Agent] Nausea And Vomiting    And headaches  . Trazodone And Nefazodone Other (See Comments)    Hallucinations   . Triazolam Other (See Comments)    hallucinations    Past Medical History  Diagnosis Date  . Hypertension   . Arthritis   . Anxiety   . Sleep apnea     didn't tolerate cpap  . Hyperlipemia   . GERD (gastroesophageal  reflux disease)   . Insomnia   . SUI (stress urinary incontinence, female) S/P SLING 12-29-2011  . SOB (shortness of breath) on exertion   . Diabetes mellitus   . Blood transfusion without reported diagnosis   .  Mental disorder     bipolar  . Depression     Past Surgical History  Procedure Laterality Date  . Knee arthroscopy w/ allograft impant      LEFT KNEE, graft x 2  . Foot surgery      bilateral  . Anterior cervical decomp/discectomy fusion  2000    C6 - 7  . Pubovaginal sling  12/29/2011    Procedure: Gaynelle Arabian;  Surgeon: Reece Packer, MD;  Location: [facility];  Service: Urology;  Laterality: N/A;  cysto and sparc sling   . Lumbar fusion    . Total abdominal hysterectomy w/ bilateral salpingoophorectomy  1997  . Multiple laparoscopies for endometriosis    . Cesarean section      X2  . Repeat reconstruction acl left knee/ screws removed  03-28-2000    CADAVER GRAFT  . Reconsturction of congenital uterus anomaly  AGE 25  . Knee surgery      TOTAL 8 SURG'S  . Cardiac catheterization  05-22-2008   DR SKAINS    NO SIGNIFECANT CAD/ NORMAL LV/  EF 65%/  NO WALL MOTION ABNORMALITIES  . Cystoscopy with injection  05/04/2012    Procedure: CYSTOSCOPY WITH INJECTION;  Surgeon: Reece Packer, MD;  Location: [facility];  Service: Urology;  Laterality: N/A;  MACROPLASTIQUE INJECTION  . Cystoscopy with injection  08/28/2012    Procedure: CYSTOSCOPY WITH INJECTION;  Surgeon: Reece Packer, MD;  Location: [facility];  Service: Urology;  Laterality: N/A;  cysto and macroplastique   . Right carpal tunnel surgery  04-25-2013  . Cysto N/A 04/30/2013    Procedure: CYSTOSCOPY;  Surgeon: Reece Packer, MD;  Location: [facility];  Service: Urology;  Laterality: N/A;  . Pubovaginal sling N/A 04/30/2013    Procedure: REMOVAL OF VAGINAL MESH;  Surgeon: Reece Packer, MD;  Location: [facility];  Service: Urology;  Laterality: N/A;     No family history on file.  Social History:  reports that she quit smoking about 12 months ago. Her smoking use included Cigarettes. She has a 105 pack-year smoking history. She has never used smokeless tobacco. She reports that she does not drink alcohol or use illicit drugs.  Review of Systems -   She has history of high triglycerides, last level was 319 and is supposed to be taking fenofibrate. Her last LDL was 73  Hypertension:  Here for hypertension F/U BP is well-controlled , patient is on Aldactone from PCP    NEUROPATHY: She says she is having tingling and discomfort in her fingers She is unclear whether she is taking her gabapentin 300 mg. She has 2 bottles of this medication She does not want to take medication more than once a day but is complaining about marked pains in her lower legs    DEPRESSION: Unclear whether she is on any new medications since her hospitalization  Asking about tendency to rash on her lower legs   Examination:   BP 122/84  Pulse 97  Temp(Src) 98.5 F (36.9 C)  Resp 12  Ht 5\' 5"  (1.651 m)  Wt 193 lb 11.2 oz (87.862 kg)  BMI 32.23 kg/m2  SpO2 98%  Body mass index is 32.23 kg/(m^2).   Scabs with mild erythema on her lower legs Abdomen shows reddish outline of her previous pump application areas  Assesment/Plan:   1. Diabetes type 2, uncontrolled - 250.02  The patient's diabetes control is difficult to assess since she has  not monitored her sugar at home but her A1c is somewhat better earlier this month. Not clear if this was from improve diet during her hospitalization She also has been taking Invokana and this may be helping Problems identified:  Still drinking regular soft drinks  She has some confusion and not giving an accurate history about her treatment  Not checking her blood sugars recently  Her blood sugars are better today and on her labs and not clear if she will require the 40 unit basal on the pump  Recently not  taking any coverage for her meals with boluses. Also previously forgetting to bolus at the time of meals  Plan: Will have her reduce her basal rate on the pump to avoid potential hypoglycemia Continue Invokana Emphasized the need to start checking her blood sugars She will continue to try and reduce her soft drinks intake Discussed dosage for her mealtime boluses to be taken right at mealtimes She will need a short-term followup to assess her insulin requirement  HYPERTENSION:  well controlled today  NEUROPATHY: She is more symptomatic now. Advised her to take gabapentin as needed up to 4 times a day  Hyperlipidemia: She will bring all her medications, need to see she is taking her fenofibrate  Counseling time over 50% of today's 25 minute visit  Tyjai Matuszak 11/14/2013, 8:43 PM

## 2013-12-13 ENCOUNTER — Telehealth: Payer: Self-pay | Admitting: *Deleted

## 2013-12-13 ENCOUNTER — Other Ambulatory Visit (INDEPENDENT_AMBULATORY_CARE_PROVIDER_SITE_OTHER): Payer: Medicare Other

## 2013-12-13 DIAGNOSIS — E785 Hyperlipidemia, unspecified: Secondary | ICD-10-CM

## 2013-12-13 DIAGNOSIS — IMO0001 Reserved for inherently not codable concepts without codable children: Secondary | ICD-10-CM

## 2013-12-13 LAB — MICROALBUMIN / CREATININE URINE RATIO: Microalb Creat Ratio: 0.7 mg/g (ref 0.0–30.0)

## 2013-12-13 LAB — LIPID PANEL
Cholesterol: 139 mg/dL (ref 0–200)
HDL: 33.6 mg/dL — ABNORMAL LOW (ref >39.00)
Triglycerides: 280 mg/dL — ABNORMAL HIGH (ref 0.0–149.0)
VLDL: 56 mg/dL — ABNORMAL HIGH (ref 0.0–40.0)

## 2013-12-13 LAB — URINALYSIS, ROUTINE W REFLEX MICROSCOPIC
Ketones, ur: NEGATIVE
Leukocytes, UA: NEGATIVE
Nitrite: NEGATIVE
Specific Gravity, Urine: <=1.005 — AB (ref 1.000–1.030)
Urine Glucose: >=1000 — AB
pH: 6 (ref 5.0–8.0)

## 2013-12-13 LAB — BASIC METABOLIC PANEL
BUN: 7 mg/dL (ref 6–23)
Calcium: 9.1 mg/dL (ref 8.4–10.5)
Chloride: 97 mEq/L (ref 96–112)
Creatinine, Ser: 1 mg/dL (ref 0.4–1.2)
GFR: 62.75 mL/min (ref >60.00)
Sodium: 135 mEq/L (ref 135–145)

## 2013-12-13 LAB — LDL CHOLESTEROL, DIRECT: Direct LDL: 81.9 mg/dL

## 2013-12-13 NOTE — Telephone Encounter (Signed)
Skin changes not from Diabetes, to see PCP

## 2013-12-13 NOTE — Telephone Encounter (Signed)
Noted, left message on patients voicemail

## 2013-12-13 NOTE — Telephone Encounter (Signed)
Patient came in for her labs today, she stopped by complaining of her hands, she said they have tiny white blister type spots, they feel like they are falling asleep and they turn dark red on her palms.  She has an appt on Monday.  She spoke with her pcp who said to talk to you about it?

## 2013-12-14 LAB — FRUCTOSAMINE: Fructosamine: 269 umol/L (ref <285)

## 2013-12-16 ENCOUNTER — Encounter: Payer: Self-pay | Admitting: Endocrinology

## 2013-12-16 ENCOUNTER — Ambulatory Visit (INDEPENDENT_AMBULATORY_CARE_PROVIDER_SITE_OTHER): Payer: Medicare Other | Admitting: Endocrinology

## 2013-12-16 ENCOUNTER — Other Ambulatory Visit: Payer: Self-pay | Admitting: *Deleted

## 2013-12-16 VITALS — BP 150/82 | HR 105 | Temp 98.2°F | Resp 12 | Ht 65.0 in | Wt 194.3 lb

## 2013-12-16 DIAGNOSIS — E1142 Type 2 diabetes mellitus with diabetic polyneuropathy: Secondary | ICD-10-CM

## 2013-12-16 DIAGNOSIS — IMO0001 Reserved for inherently not codable concepts without codable children: Secondary | ICD-10-CM

## 2013-12-16 DIAGNOSIS — I10 Essential (primary) hypertension: Secondary | ICD-10-CM

## 2013-12-16 DIAGNOSIS — E876 Hypokalemia: Secondary | ICD-10-CM

## 2013-12-16 DIAGNOSIS — E785 Hyperlipidemia, unspecified: Secondary | ICD-10-CM

## 2013-12-16 NOTE — Progress Notes (Signed)
Patient ID: [name], female   DOB: Oct 05, 1966, 48 y.o.   MRN: [id number]  Reason for Appointment: Diabetes follow-up   History of Present Illness:   Diagnosis: Type 2 diabetes mellitus, date of diagnosis: 2004.  PAST history: She has been treated mostly with insulin since about a year after diagnosis. She has had difficulty with consistent compliance with diet and also compliance with self care including glucose monitoring over the years. She had been mostly treated with basal insulin. Also had been tried on mealtime insulin but she would be noncompliant with this. Was tried on Prandin for mealtime control but difficult to judge efficacy because of lack of postprandial monitoring. Was given Victoza to start in 2011 but did not follow up after this.She was tried on premixed insulin but this did not help her control, mostly because of noncompliance with the doses. She has been using the V- go pump and had better control initially and was well compliant with the daily routine and boluses. She has had frequent education visits also.   RECENT history:   Again she appears to have poor control since her last visit with noncompliance with various aspects of self-care Although her fructosamine is not unusually high her glucose in the lab was over 300 Problems identified: 1. Difficult to assess her home control as she does not monitor her blood sugar 2. She is also given variable answers about whether she is taking her Invokana which apparently has helped her control previously. She thinks it was not approved by her insurance 3. She is frequently forgetting to take her boluses when she is eating. 4. She is unclear about whether she is using her pump every day. She thinks her pump will last for 2 days at a time. Also does not know whether she is using the 40 unit pump 5. She is still drinking regular soft drinks but also is trying to drink more water recently.   Glucometer: ? [employer] brand.  Blood  Glucose readings: Unknown Hypoglycemia frequency: Never.    Food preferences: eating one or 2 meals per day .  Physical activity: exercise: Minimal.  Certified Diabetes Educator visit: Most recent:, 2/14.  The diet that the patient has been following is no real diet And eating at irregular times, sometimes not every day;, no balanced meals.   Wt Readings from Last 3 Encounters:  12/16/13 194 lb 4.8 oz (88.134 kg)  11/13/13 193 lb 11.2 oz (87.862 kg)  10/02/13 187 lb (84.823 kg)   DM labs:  Lab Results  Component Value Date   HGBA1C 8.8* 11/06/2013   HGBA1C 12.0* 07/22/2013   Lab Results  Component Value Date   MICROALBUR 0.2 12/13/2013   CREATININE 1.0 123456   Complications: are: peripheral neuropathy.  PROBLEM 2: Asking about tenderness of her fingertips, discomfort in her hands and what she thinks is internal blistering in her hands. No itching or rash. Fingertips are sensitive. She is supposed to be on gabapentin but she only likes to take medications once a day   Labs:  Appointment on 12/13/2013  Component Date Value Range Status  . Fructosamine 12/13/2013 269  <285 umol/L Final   Comment:                            Variations in levels of serum proteins (albumin and immunoglobulins)  may affect fructosamine results.                             . Sodium 12/13/2013 135  135 - 145 mEq/L Final  . Potassium 12/13/2013 3.2* 3.5 - 5.1 mEq/L Final  . Chloride 12/13/2013 97  96 - 112 mEq/L Final  . CO2 12/13/2013 27  19 - 32 mEq/L Final  . Glucose, Bld 12/13/2013 327* 70 - 99 mg/dL Final  . BUN 12/13/2013 7  6 - 23 mg/dL Final  . Creatinine, Ser 12/13/2013 1.0  0.4 - 1.2 mg/dL Final  . Calcium 12/13/2013 9.1  8.4 - 10.5 mg/dL Final  . GFR 12/13/2013 62.75  >60.00 mL/min Final  . Cholesterol 12/13/2013 139  0 - 200 mg/dL Final   ATP III Classification       Desirable:  < 200 mg/dL               Borderline High:  200 - 239 mg/dL          High:  >  = 240 mg/dL  . Triglycerides 12/13/2013 280.0* 0.0 - 149.0 mg/dL Final   Normal:  <150 mg/dLBorderline High:  150 - 199 mg/dL  . HDL 12/13/2013 33.60* >39.00 mg/dL Final  . VLDL 12/13/2013 56.0* 0.0 - 40.0 mg/dL Final  . Total CHOL/HDL Ratio 12/13/2013 4   Final                  Men          Women1/2 Average Risk     3.4          3.3Average Risk          5.0          4.42X Average Risk          9.6          7.13X Average Risk          15.0          11.0                      . Microalb, Ur 12/13/2013 0.2  0.0 - 1.9 mg/dL Final  . Creatinine,U 12/13/2013 28.5   Final  . Microalb Creat Ratio 12/13/2013 0.7  0.0 - 30.0 mg/g Final  . Color, Urine 12/13/2013 YELLOW  Yellow;Lt. Yellow Final  . APPearance 12/13/2013 CLEAR  Clear Final  . Specific Gravity, Urine 12/13/2013 <=1.005* 1.000 - 1.030 Final  . pH 12/13/2013 6.0  5.0 - 8.0 Final  . Total Protein, Urine 12/13/2013 NEGATIVE  Negative Final  . Urine Glucose 12/13/2013 >=1000* Negative Final  . Ketones, ur 12/13/2013 NEGATIVE  Negative Final  . Bilirubin Urine 12/13/2013 NEGATIVE  Negative Final  . Hgb urine dipstick 12/13/2013 NEGATIVE  Negative Final  . Urobilinogen, UA 12/13/2013 0.2  0.0 - 1.0 Final  . Leukocytes, UA 12/13/2013 NEGATIVE  Negative Final  . Nitrite 12/13/2013 NEGATIVE  Negative Final  . WBC, UA 12/13/2013 0-2/hpf  0-2/hpf Final  . Squamous Epithelial / LPF 12/13/2013 Rare(0-4/hpf)  Rare(0-4/hpf) Final  . Direct LDL 12/13/2013 81.9   Final   Optimal:  <100 mg/dLNear or Above Optimal:  100-129 mg/dLBorderline High:  130-159 mg/dLHigh:  160-189 mg/dLVery High:  >190 mg/dL      Medication List       This list is accurate as of: 12/16/13  3:21 PM.  Always use your most recent med  list.               alprazolam 2 MG tablet  Commonly known as:  XANAX  Take 2 mg by mouth at bedtime as needed for sleep. For anxiety     amitriptyline 50 MG tablet  Commonly known as:  ELAVIL  Take 150 mg by mouth at bedtime.      amphetamine-dextroamphetamine 20 MG tablet  Commonly known as:  ADDERALL  Take 20 mg by mouth daily before breakfast.     Canagliflozin 100 MG Tabs  Take 1 tablet (100 mg total) by mouth daily.     clonazePAM 2 MG tablet  Commonly known as:  KLONOPIN  Take 2 mg by mouth at bedtime as needed. For sleep     diazepam 10 MG tablet  Commonly known as:  VALIUM  Take 20 mg by mouth at bedtime as needed. For sleep     doxepin 75 MG capsule  Commonly known as:  SINEQUAN  Take 150 mg by mouth at bedtime.     doxycycline 100 MG tablet  Commonly known as:  VIBRA-TABS  100 mg.     esomeprazole 40 MG capsule  Commonly known as:  NEXIUM  Take 40 mg by mouth at bedtime.     FETZIMA 40 MG Cp24  Generic drug:  Levomilnacipran HCl ER     fluconazole 150 MG tablet  Commonly known as:  DIFLUCAN  Take 1 tablet (150 mg total) by mouth once.     gabapentin 300 MG capsule  Commonly known as:  NEURONTIN  Take 1 capsule (300 mg total) by mouth 3 (three) times daily.     HYDROcodone-acetaminophen 5-325 MG per tablet  Commonly known as:  NORCO/VICODIN     insulin aspart 100 UNIT/ML injection  Commonly known as:  novoLOG  To use with V-Go 40 Pump     ketoconazole 2 % cream  Commonly known as:  NIZORAL     MAGNESIUM PO  Take 1 tablet by mouth daily.     mupirocin ointment 2 %  Commonly known as:  BACTROBAN     OLANZapine 5 MG tablet  Commonly known as:  ZYPREXA     Oxcarbazepine 300 MG tablet  Commonly known as:  TRILEPTAL  Take 900 mg by mouth at bedtime.     oxycodone 5 MG capsule  Commonly known as:  OXY-IR  Take 5 mg by mouth every 4 (four) hours as needed for pain.     oxyCODONE-acetaminophen 10-325 MG per tablet  Commonly known as:  PERCOCET  Take 1 tablet by mouth every 4 (four) hours as needed for pain.     potassium chloride SA 20 MEQ tablet  Commonly known as:  K-DUR,KLOR-CON  Take 20 mEq by mouth 2 (two) times daily.     ROZEREM 8 MG tablet  Generic drug:   ramelteon  Take 16 mg by mouth at bedtime.     spironolactone 100 MG tablet  Commonly known as:  ALDACTONE  Take 1 tablet (100 mg total) by mouth daily.     V-GO 20 Kit  Use as directed        Allergies:  Allergies  Allergen Reactions  . Latex Other (See Comments)    Pt states she cannot use condoms - cause an infection.  Use of latex on skin is okay  . Ibuprofen Other (See Comments)    headaches  . Sweetening Enhancer [Flavoring Agent] Nausea And Vomiting    And headaches  .  Trazodone And Nefazodone Other (See Comments)    Hallucinations   . Triazolam Other (See Comments)    hallucinations    Past Medical History  Diagnosis Date  . Hypertension   . Arthritis   . Anxiety   . Sleep apnea     didn't tolerate cpap  . Hyperlipemia   . GERD (gastroesophageal reflux disease)   . Insomnia   . SUI (stress urinary incontinence, female) S/P SLING 12-29-2011  . SOB (shortness of breath) on exertion   . Diabetes mellitus   . Blood transfusion without reported diagnosis   . Mental disorder     bipolar  . Depression     Past Surgical History  Procedure Laterality Date  . Knee arthroscopy w/ allograft impant      LEFT KNEE, graft x 2  . Foot surgery      bilateral  . Anterior cervical decomp/discectomy fusion  2000    C6 - 7  . Pubovaginal sling  12/29/2011    Procedure: Gaynelle Arabian;  Surgeon: Reece Packer, MD;  Location: [facility];  Service: Urology;  Laterality: N/A;  cysto and sparc sling   . Lumbar fusion    . Total abdominal hysterectomy w/ bilateral salpingoophorectomy  1997  . Multiple laparoscopies for endometriosis    . Cesarean section      X2  . Repeat reconstruction acl left knee/ screws removed  03-28-2000    CADAVER GRAFT  . Reconsturction of congenital uterus anomaly  AGE 63  . Knee surgery      TOTAL 8 SURG'S  . Cardiac catheterization  05-22-2008   DR SKAINS    NO SIGNIFECANT CAD/ NORMAL LV/  EF 65%/  NO WALL MOTION  ABNORMALITIES  . Cystoscopy with injection  05/04/2012    Procedure: CYSTOSCOPY WITH INJECTION;  Surgeon: Reece Packer, MD;  Location: [facility];  Service: Urology;  Laterality: N/A;  MACROPLASTIQUE INJECTION  . Cystoscopy with injection  08/28/2012    Procedure: CYSTOSCOPY WITH INJECTION;  Surgeon: Reece Packer, MD;  Location: [facility];  Service: Urology;  Laterality: N/A;  cysto and macroplastique   . Right carpal tunnel surgery  04-25-2013  . Cysto N/A 04/30/2013    Procedure: CYSTOSCOPY;  Surgeon: Reece Packer, MD;  Location: [facility];  Service: Urology;  Laterality: N/A;  . Pubovaginal sling N/A 04/30/2013    Procedure: REMOVAL OF VAGINAL MESH;  Surgeon: Reece Packer, MD;  Location: [facility];  Service: Urology;  Laterality: N/A;    No family history on file.  Social History:  reports that she quit smoking about 13 months ago. Her smoking use included Cigarettes. She has a 105 pack-year smoking history. She has never used smokeless tobacco. She reports that she does not drink alcohol or use illicit drugs.  Review of Systems -   She has history of high triglycerides, last level was 280 and is supposed to be taking fenofibrate  Hypertension:  patient is on Aldactone from PCP; not clear if she is taking this every day; blood pressure is higher than usual today and she is anxious also  HYPOKALEMIA: Her potassium is significantly low. She is not having any cramps..If she is taking her Aldactone.   NEUROPATHY: She is not having as much difficulty with her feet  DEPRESSION: Unclear whether she is taking her medications daily.  Asking about some dark spots, resulting from previous infections and scarring on her lower legs  Noticing swelling  of her legs   Examination:   BP 150/82  Pulse 105  Temp(Src) 98.2 F (36.8 C)  Resp 12  Ht 5\' 5"  (1.651 m)  Wt 194 lb 4.8 oz (88.134 kg)  BMI 32.33 kg/m2  SpO2 97%  Body mass index is 32.33  kg/(m^2).   Scabs with brownish pigmentation on her lower legs No skin lesions seen on her hands She has increased sensitivity of the fingertips but no loss of sensation No ankle edema   Assesment/Plan:   1. Diabetes type 2, uncontrolled - 250.02  The patient's diabetes control is difficult to assess since she has not monitored her sugar at home again but her fructosamine is not high. However since her lab glucose is over 300 she is unlikely to be getting consistent control is somewhat better earlier this month.  Problems identified:  Still at times drinking regular soft drinks  She has not been using her insulin pump consistently and cannot give me a definite answer on how often she is doing this  Not checking her blood sugars at all.   She has significant depression and is overall very noncompliant  Her blood sugars probably did better with Invokana and doubt if she is taking this regularly as she is noncompliant with other medications also  Not taking coverage for her meals with boluses consistently and often forgetting them. Difficult to improve her compliance with this  Plan: Will have her continue 40 unit basal Continue Invokana, will check to see if she has any difficulties with insurance coverage Emphasized the need to start checking her blood sugars She will continue to try and reduce her soft drinks intake Discussed dosage for her mealtime boluses to be taken at mealtimes, within 15 minutes of eating She will need a  consultation with nurse educator to improve day-to-day management and instructed on alternate site testing, not clear what monitor she is using  2. Hypokalemia: She was started back on potassium for now since she continues to have low normal or low potassium levels  3. Neuropathy: Advised her to take her gabapentin regularly and improve diabetes control  Counseling time over 50% of today's 25 minute visit   Liadan Guizar 12/16/2013, 3:21 PM

## 2013-12-16 NOTE — Patient Instructions (Addendum)
Magnesium 2 pills daily  Spironolactone and INVOKANA  daily  Check sugar 2x daily AND WRITE IT DOWN  STAY ON THE V-GO 40 PUMP  Click when eating, 2 more when sugar > 200

## 2013-12-17 MED ORDER — POTASSIUM CHLORIDE CRYS ER 20 MEQ PO TBCR: 20.0000 meq | EXTENDED_RELEASE_TABLET | Freq: Once | ORAL | Status: DC

## 2013-12-23 ENCOUNTER — Encounter: Payer: Self-pay | Admitting: Cardiology

## 2013-12-23 DIAGNOSIS — R079 Chest pain, unspecified: Secondary | ICD-10-CM | POA: Insufficient documentation

## 2013-12-23 DIAGNOSIS — E7800 Pure hypercholesterolemia, unspecified: Secondary | ICD-10-CM | POA: Insufficient documentation

## 2013-12-23 DIAGNOSIS — I499 Cardiac arrhythmia, unspecified: Secondary | ICD-10-CM | POA: Insufficient documentation

## 2013-12-23 DIAGNOSIS — E785 Hyperlipidemia, unspecified: Secondary | ICD-10-CM | POA: Insufficient documentation

## 2013-12-23 DIAGNOSIS — R609 Edema, unspecified: Secondary | ICD-10-CM | POA: Insufficient documentation

## 2013-12-23 DIAGNOSIS — I10 Essential (primary) hypertension: Secondary | ICD-10-CM | POA: Insufficient documentation

## 2014-01-03 ENCOUNTER — Ambulatory Visit: Payer: Medicare Other

## 2014-01-06 ENCOUNTER — Ambulatory Visit (INDEPENDENT_AMBULATORY_CARE_PROVIDER_SITE_OTHER): Payer: Medicare Other | Admitting: Cardiology

## 2014-01-06 ENCOUNTER — Encounter: Payer: Self-pay | Admitting: Cardiology

## 2014-01-06 VITALS — BP 169/95 | HR 87 | Ht 65.0 in | Wt 190.0 lb

## 2014-01-06 DIAGNOSIS — I739 Peripheral vascular disease, unspecified: Secondary | ICD-10-CM

## 2014-01-06 DIAGNOSIS — IMO0001 Reserved for inherently not codable concepts without codable children: Secondary | ICD-10-CM

## 2014-01-06 DIAGNOSIS — E1165 Type 2 diabetes mellitus with hyperglycemia: Secondary | ICD-10-CM

## 2014-01-06 DIAGNOSIS — E785 Hyperlipidemia, unspecified: Secondary | ICD-10-CM

## 2014-01-06 DIAGNOSIS — IMO0002 Reserved for concepts with insufficient information to code with codable children: Secondary | ICD-10-CM

## 2014-01-06 DIAGNOSIS — R079 Chest pain, unspecified: Secondary | ICD-10-CM

## 2014-01-06 DIAGNOSIS — I499 Cardiac arrhythmia, unspecified: Secondary | ICD-10-CM

## 2014-01-06 NOTE — Progress Notes (Signed)
    [address] [place], [place]  [address] Phone: [phone] Fax:  [phone]  Date:  01/06/2014   ID:  [name], DOB 09/24/1966, MRN [id number]  PCP:  LITTLE,JAMES, MD   History of Present Illness: [name] is a 48 y.o. female previously seen in 2009 here to reestablish care. Has diagnosis of diabetes, hemoglobin A1c 8.8 on 11/14, with neuropathy, hypokalemia for the evaluation of chest pain. At prior catheterization in November of 2008 which showed no angiographically significant coronary artery disease.  Podiatritst had trouble getting pulses in feet. Had chest pain. Tightness. Can be at rest. Comes and goes. Feels short of breath with or without exertion. Unfortunately has not been able to fully quit smoking. Has excruciating back pain. Unable to walk treadmill.  She also has diminished sensation in her legs/feet bilaterally with pain when walking.  Trigs 1500 at one point she states, currently 280.   She states that she has pain sensation in her hands bilaterally, feels as though there is blistering occurring from the inside out.  Wt Readings from Last 3 Encounters:  01/06/14 190 lb (86.183 kg)  12/16/13 194 lb 4.8 oz (88.134 kg)  11/13/13 193 lb 11.2 oz (87.862 kg)     Past Medical History  Diagnosis Date  . Hypertension   . Arthritis   . Anxiety   . Sleep apnea     didn't tolerate cpap  . Hyperlipemia   . GERD (gastroesophageal reflux disease)   . Insomnia   . SUI (stress urinary incontinence, female) S/P SLING 12-29-2011  . SOB (shortness of breath) on exertion   . Diabetes mellitus   . Blood transfusion without reported diagnosis   . Mental disorder     bipolar  . Depression   . Neurogenic bladder   . Hypercholesterolemia   . Gastroparesis   . Ulcer   . MRSA (methicillin resistant Staphylococcus aureus)   . Chronic pain   . Edema   . Abnormal heart rhythm   . Chest pain   . Bipolar 1 disorder   . Depression   . Anxiety    . Vertigo   . DJD (degenerative joint disease)   . COPD (chronic obstructive pulmonary disease)   . Cholecystitis   . Hypersomnia     Past Surgical History  Procedure Laterality Date  . Knee arthroscopy w/ allograft impant      LEFT KNEE, graft x 2  . Foot surgery      bilateral  . Anterior cervical decomp/discectomy fusion  2000    C6 - 7  . Pubovaginal sling  12/29/2011    Procedure: PUBO-VAGINAL SLING;  Surgeon: Scott A MacDiarmid, MD;  Location: [facility];  Service: Urology;  Laterality: N/A;  cysto and sparc sling   . Lumbar fusion    . Total abdominal hysterectomy w/ bilateral salpingoophorectomy  1997  . Multiple laparoscopies for endometriosis    . Cesarean section      X2  . Repeat reconstruction acl left knee/ screws removed  03-28-2000    CADAVER GRAFT  . Reconsturction of congenital uterus anomaly  AGE 16  . Knee surgery      TOTAL 8 SURG'S  . Cardiac catheterization  05-22-2008   DR     NO SIGNIFECANT CAD/ NORMAL LV/  EF 65%/  NO WALL MOTION ABNORMALITIES  . Cystoscopy with injection  05/04/2012    Procedure: CYSTOSCOPY WITH INJECTION;  Surgeon: Scott A   MacDiarmid, MD;  Location: [facility];  Service: Urology;  Laterality: N/A;  MACROPLASTIQUE INJECTION  . Cystoscopy with injection  08/28/2012    Procedure: CYSTOSCOPY WITH INJECTION;  Surgeon: Reece Packer, MD;  Location: [facility];  Service: Urology;  Laterality: N/A;  cysto and macroplastique   . Right carpal tunnel surgery  04-25-2013  . Cysto N/A 04/30/2013    Procedure: CYSTOSCOPY;  Surgeon: Reece Packer, MD;  Location: [facility];  Service: Urology;  Laterality: N/A;  . Pubovaginal sling N/A 04/30/2013    Procedure: REMOVAL OF VAGINAL MESH;  Surgeon: Reece Packer, MD;  Location: [facility];  Service: Urology;  Laterality: N/A;    Current Outpatient Prescriptions  Medication Sig Dispense Refill  . alprazolam (XANAX) 2 MG tablet Take 2 mg by mouth at  bedtime as needed for sleep. For anxiety      . amitriptyline (ELAVIL) 50 MG tablet Take 150 mg by mouth at bedtime.       [name] amphetamine-dextroamphetamine (ADDERALL) 20 MG tablet Take 20 mg by mouth daily before breakfast.       . Canagliflozin 100 MG TABS Take 1 tablet (100 mg total) by mouth daily.  30 tablet  5  . clonazePAM (KLONOPIN) 2 MG tablet Take 2 mg by mouth at bedtime as needed. For sleep      . diazepam (VALIUM) 10 MG tablet Take 20 mg by mouth at bedtime as needed. For sleep      . doxepin (SINEQUAN) 75 MG capsule Take 150 mg by mouth at bedtime.      [name] doxycycline (VIBRA-TABS) 100 MG tablet 100 mg.      . esomeprazole (NEXIUM) 40 MG capsule Take 40 mg by mouth at bedtime.       [name] FETZIMA 40 MG CP24       . fluconazole (DIFLUCAN) 150 MG tablet Take 1 tablet (150 mg total) by mouth once.  1 tablet  0  . gabapentin (NEURONTIN) 300 MG capsule Take 1 capsule (300 mg total) by mouth 3 (three) times daily.  90 capsule  3  . HYDROcodone-acetaminophen (NORCO/VICODIN) 5-325 MG per tablet       . insulin aspart (NOVOLOG) 100 UNIT/ML injection To use with V-Go 40 Pump      . Insulin Disposable Pump (V-GO 40) KIT by Does not apply route.      [name] ketoconazole (NIZORAL) 2 % cream       . MAGNESIUM PO Take 1 tablet by mouth daily.      . mupirocin ointment (BACTROBAN) 2 %       . OLANZapine (ZYPREXA) 5 MG tablet       . Oxcarbazepine (TRILEPTAL) 300 MG tablet Take 900 mg by mouth at bedtime.       [name] oxycodone (OXY-IR) 5 MG capsule Take 5 mg by mouth every 4 (four) hours as needed for pain.      [name] oxyCODONE-acetaminophen (PERCOCET) 10-325 MG per tablet Take 1 tablet by mouth every 4 (four) hours as needed for pain.      . potassium chloride SA (K-DUR,KLOR-CON) 20 MEQ tablet Take 1 tablet (20 mEq total) by mouth once.  30 tablet  3  . ramelteon (ROZEREM) 8 MG tablet Take 16 mg by mouth at bedtime.       [name] spironolactone (ALDACTONE) 100 MG tablet Take 1 tablet (100 mg total) by mouth daily.  30  tablet  5   No current facility-administered medications for  this visit.    Allergies:    Allergies  Allergen Reactions  . Latex Other (See Comments)    Pt states she cannot use condoms - cause an infection.  Use of latex on skin is okay  . Ibuprofen Other (See Comments)    headaches  . Sweetening Enhancer [Flavoring Agent] Nausea And Vomiting    And headaches  . Trazodone And Nefazodone Other (See Comments)    Hallucinations   . Triazolam Other (See Comments)    hallucinations    Social History:  The patient  reports that she quit smoking about 14 months ago. Her smoking use included Cigarettes. She has a 105 pack-year smoking history. She has never used smokeless tobacco. She reports that she does not drink alcohol or use illicit drugs. Recently started back up.   Family History  Problem Relation Age of Onset  . Hypertension Mother   . Diabetes Mother     ROS:  Please see the history of present illness.   Denies any fevers, chills, orthopnea, PND, strokelike symptoms, bruising, rashes, bleeding   +Back pain. All other systems reviewed and negative.   PHYSICAL EXAM: VS:  BP 169/95  Pulse 87  Ht 5' 5" (1.651 m)  Wt 190 lb (86.183 kg)  BMI 31.62 kg/m2 Well nourished, well developed, in no acute distress HEENT: normal, [place]/AT, EOMI Neck: no JVD, normal carotid upstroke, no bruit Cardiac:  normal S1, S2; RRR; no murmur Lungs:  clear to auscultation bilaterally, no wheezing, rhonchi or rales Abd: soft, nontender, no hepatomegaly, no bruits Ext: no edema, diminished distal pulses, diminished sensation in feet bilaterally. I do not see any clear abnormalities with her hands. Skin: warm and dry GU: deferred Neuro: no focal abnormalities noted, AAO x 3  EKG:   sinus rhythm, 90, no other abnormalities Labs: 12/13/13-Potassium 3.2, LDL 81, creatinine 1.0     ASSESSMENT AND PLAN:  1. Hypokalemia -- potassium 3.2 on 12/13/13. Potassium sparing diuretic. Per Dr.  Vaughan Sine. 2. Chest pain-atypical, EKG reassuring however given her tobacco use, brother with CAD, hyperlipidemia, diabetes we will perform stress test. Unable to walk on treadmill because of severe back pain. Pharmacologic study. At prior cardiac catheterization in November of 2008 which showed no angiographically significant coronary artery disease. 3. Diabetes-uncontrolled-strongly encouraged continued compliance. Hemoglobin A1c 8.811/12/14. 4. Hyperlipidemia-LDL 56, triglycerides 280. Has diabetes becomes better controlled, triglycerides will also be better controlled. Would consider statin therapy if she is able to tolerate given her diabetic status. 5. Claudication/diminished lower extremity pulses-decreased sensation in her legs, diminished pulses. I will check lower extremity arterial Dopplers. She is at high risk for PAD. 6. We will followup on studies.  Signed, Candee Furbish, MD [facility]  01/06/2014 3:12 PM

## 2014-01-06 NOTE — Patient Instructions (Addendum)
Your physician has requested that you have a lexiscan myoview. For further information please visit [email or web address]. Please follow instruction sheet, as given.  Your physician recommends that you schedule a follow-up appointment as needed with Dr. Marlou Porch  Your physician recommends that you continue on your current medications as directed. Please refer to the Current Medication list given to you today.  Your physician has requested that you have a lower extremity arterial doppler.  There are no restrictions or special instructions

## 2014-01-07 ENCOUNTER — Ambulatory Visit (HOSPITAL_COMMUNITY): Payer: Medicare Other | Attending: Cardiovascular Disease | Admitting: Radiology

## 2014-01-07 ENCOUNTER — Encounter: Payer: Self-pay | Admitting: Cardiovascular Disease

## 2014-01-07 DIAGNOSIS — R079 Chest pain, unspecified: Secondary | ICD-10-CM

## 2014-01-07 DIAGNOSIS — R0989 Other specified symptoms and signs involving the circulatory and respiratory systems: Secondary | ICD-10-CM

## 2014-01-07 MED ORDER — TECHNETIUM TC 99M SESTAMIBI GENERIC - CARDIOLITE
32.0000 | Freq: Once | INTRAVENOUS | Status: AC | PRN
  Administered 2014-01-07: 32 via INTRAVENOUS

## 2014-01-08 ENCOUNTER — Ambulatory Visit: Payer: Medicare Other

## 2014-01-08 ENCOUNTER — Ambulatory Visit (HOSPITAL_COMMUNITY): Payer: Medicare Other | Attending: Cardiovascular Disease | Admitting: Radiology

## 2014-01-08 ENCOUNTER — Encounter (HOSPITAL_COMMUNITY): Payer: Medicare Other

## 2014-01-08 VITALS — BP 171/98 | Ht 65.0 in | Wt 193.0 lb

## 2014-01-08 DIAGNOSIS — F17200 Nicotine dependence, unspecified, uncomplicated: Secondary | ICD-10-CM | POA: Insufficient documentation

## 2014-01-08 DIAGNOSIS — Z8249 Family history of ischemic heart disease and other diseases of the circulatory system: Secondary | ICD-10-CM | POA: Insufficient documentation

## 2014-01-08 DIAGNOSIS — Z794 Long term (current) use of insulin: Secondary | ICD-10-CM | POA: Insufficient documentation

## 2014-01-08 DIAGNOSIS — I10 Essential (primary) hypertension: Secondary | ICD-10-CM | POA: Insufficient documentation

## 2014-01-08 DIAGNOSIS — R002 Palpitations: Secondary | ICD-10-CM | POA: Insufficient documentation

## 2014-01-08 DIAGNOSIS — J449 Chronic obstructive pulmonary disease, unspecified: Secondary | ICD-10-CM | POA: Insufficient documentation

## 2014-01-08 DIAGNOSIS — R079 Chest pain, unspecified: Secondary | ICD-10-CM

## 2014-01-08 DIAGNOSIS — E785 Hyperlipidemia, unspecified: Secondary | ICD-10-CM | POA: Insufficient documentation

## 2014-01-08 DIAGNOSIS — E119 Type 2 diabetes mellitus without complications: Secondary | ICD-10-CM | POA: Insufficient documentation

## 2014-01-08 DIAGNOSIS — J4489 Other specified chronic obstructive pulmonary disease: Secondary | ICD-10-CM | POA: Insufficient documentation

## 2014-01-08 DIAGNOSIS — R0989 Other specified symptoms and signs involving the circulatory and respiratory systems: Secondary | ICD-10-CM | POA: Insufficient documentation

## 2014-01-08 DIAGNOSIS — R0789 Other chest pain: Secondary | ICD-10-CM | POA: Insufficient documentation

## 2014-01-08 DIAGNOSIS — R0609 Other forms of dyspnea: Secondary | ICD-10-CM | POA: Insufficient documentation

## 2014-01-08 DIAGNOSIS — R0602 Shortness of breath: Secondary | ICD-10-CM | POA: Insufficient documentation

## 2014-01-08 MED ORDER — TECHNETIUM TC 99M SESTAMIBI GENERIC - CARDIOLITE
30.0000 | Freq: Once | INTRAVENOUS | Status: AC | PRN
  Administered 2014-01-08: 30 via INTRAVENOUS

## 2014-01-08 MED ORDER — REGADENOSON 0.4 MG/5ML IV SOLN
0.4000 mg | Freq: Once | INTRAVENOUS | Status: AC
  Administered 2014-01-08: 0.4 mg via INTRAVENOUS

## 2014-01-08 NOTE — Progress Notes (Signed)
[facility] [address] [place], [place] [address] [phone]    Cardiology Nuclear Med Study  [name] is a 48 y.o. female     MRN : [id number]     DOB: 08-12-1966  Procedure Date: 01/08/2014  Nuclear Med Background Indication for Stress Test:  Evaluation for Ischemia History:  COPD and MPI, 2008 NL Cath No Hx CAD Cardiac Risk Factors: Family History - CAD, Hypertension, IDDM, Lipids and Smoker  Symptoms:  Chest Tightness, DOE, Palpitations and SOB   Nuclear Pre-Procedure Caffeine/Decaff Intake:  None > 12 hrs NPO After: 10:00pm   Lungs:  clear O2 Sat: 99% on room air. IV 0.9% NS with Angio Cath:  22g  IV Site: R Antecubital x 1, tolerated well IV Started by:  Irven Baltimore, RN  Chest Size (in):  46 Cup Size: B  Height: 5\' 5"  (1.651 m)  Weight:  193 lb (87.544 kg)  BMI:  Body mass index is 32.12 kg/(m^2). Tech Comments:  Took medications except no insulin bolus this am. Fasting CBG was 216 at 0900 today.Irven Baltimore, RN.    Nuclear Med Study 1 or 2 day study: 2 day  Stress Test Type:  Carlton Adam  Reading MD: N/A  Order Authorizing Provider:  Candee Furbish, MD  Resting Radionuclide: Technetium 57m Sestamibi  Resting Radionuclide Dose: 32.1 mCi  01/07/14  Stress Radionuclide:  Technetium 23m Sestamibi  Stress Radionuclide Dose: 33.0 mCi  01/08/14/          Stress Protocol Rest HR: 93 Stress HR: 102  Rest BP: 171/98 Stress BP: 179/105  Exercise Time (min): n/a METS: n/a   Predicted Max HR: 172 bpm % Max HR: 59.3 bpm Rate Pressure Product: 18258   Dose of Adenosine (mg):  n/a Dose of Lexiscan: 0.4 mg  Dose of Atropine (mg): n/a Dose of Dobutamine: n/a mcg/kg/min (at max HR)  Stress Test Technologist: Perrin Maltese, EMT-P  Nuclear Technologist:  Charlton Amor, CNMT     Rest Procedure:  Myocardial perfusion imaging was performed at rest 45 minutes following the intravenous administration of Technetium 41m Sestamibi. Rest ECG: NSR  - Normal EKG  Stress Procedure:  The patient received IV Lexiscan 0.4 mg over 15-seconds.  Technetium 38m Sestamibi injected at 30-seconds. This patient had sob with the Lexiscan injection. Quantitative spect images were obtained after a 45 minute delay. Stress ECG: No significant change from baseline ECG  QPS Raw Data Images:  Normal; no motion artifact; normal heart/lung ratio. Stress Images:  Normal homogeneous uptake in all areas of the myocardium. Rest Images:  Normal homogeneous uptake in all areas of the myocardium. Subtraction (SDS):  No evidence of ischemia. Transient Ischemic Dilatation (Normal <1.22):  1.11 Lung/Heart Ratio (Normal <0.45):  0.31  Quantitative Gated Spect Images QGS EDV:  105 ml QGS ESV:  56 ml  Impression Exercise Capacity:  Lexiscan with no exercise. BP Response:  Hypertension at rest Clinical Symptoms:  No significant symptoms noted. ECG Impression:  No significant ECG changes with Lexiscan. Comparison with Prior Nuclear Study: No images to compare  LV Ejection Fraction: 47%.  LV Wall Motion:  Mild global LV hypokinesis.   Overall Impression:  Low risk stress nuclear study . Findings suggest nonischemic cardiomyopathy.  Sanda Klein, MD, [facility] [phone] office [phone] pager

## 2014-01-09 ENCOUNTER — Telehealth: Payer: Self-pay | Admitting: Cardiology

## 2014-01-09 DIAGNOSIS — I422 Other hypertrophic cardiomyopathy: Secondary | ICD-10-CM

## 2014-01-09 NOTE — Telephone Encounter (Signed)
Spoke with patient advised that LV EF 47% : Mild global LV hypokinesis, Overall low risk nuclear study, no cardiomyopathy .[name] KitchenMarland KitchenAdvised to schedule a echocardiogram when she comes in on 01/10/2014 for a duplex scan. Patient verbalized understanding . Will place order.

## 2014-01-10 ENCOUNTER — Ambulatory Visit (HOSPITAL_COMMUNITY): Payer: Medicare Other | Attending: Cardiology

## 2014-01-10 DIAGNOSIS — R209 Unspecified disturbances of skin sensation: Secondary | ICD-10-CM | POA: Insufficient documentation

## 2014-01-10 DIAGNOSIS — I10 Essential (primary) hypertension: Secondary | ICD-10-CM | POA: Insufficient documentation

## 2014-01-10 DIAGNOSIS — E119 Type 2 diabetes mellitus without complications: Secondary | ICD-10-CM | POA: Insufficient documentation

## 2014-01-10 DIAGNOSIS — F17200 Nicotine dependence, unspecified, uncomplicated: Secondary | ICD-10-CM | POA: Insufficient documentation

## 2014-01-10 DIAGNOSIS — I739 Peripheral vascular disease, unspecified: Secondary | ICD-10-CM

## 2014-01-10 DIAGNOSIS — M79609 Pain in unspecified limb: Secondary | ICD-10-CM

## 2014-01-10 DIAGNOSIS — E785 Hyperlipidemia, unspecified: Secondary | ICD-10-CM | POA: Insufficient documentation

## 2014-01-10 DIAGNOSIS — R0989 Other specified symptoms and signs involving the circulatory and respiratory systems: Secondary | ICD-10-CM

## 2014-01-13 ENCOUNTER — Other Ambulatory Visit: Payer: Self-pay

## 2014-01-13 ENCOUNTER — Emergency Department (HOSPITAL_COMMUNITY)
Admission: EM | Admit: 2014-01-13 | Discharge: 2014-01-13 | Disposition: A | Discharge status: Home / Self Care | Payer: Medicare Other | Attending: Emergency Medicine | Admitting: Emergency Medicine

## 2014-01-13 ENCOUNTER — Encounter (HOSPITAL_COMMUNITY): Payer: Self-pay | Admitting: Emergency Medicine

## 2014-01-13 ENCOUNTER — Emergency Department (HOSPITAL_COMMUNITY): Payer: Medicare Other

## 2014-01-13 DIAGNOSIS — K219 Gastro-esophageal reflux disease without esophagitis: Secondary | ICD-10-CM | POA: Insufficient documentation

## 2014-01-13 DIAGNOSIS — F319 Bipolar disorder, unspecified: Secondary | ICD-10-CM | POA: Insufficient documentation

## 2014-01-13 DIAGNOSIS — Z5189 Encounter for other specified aftercare: Secondary | ICD-10-CM | POA: Insufficient documentation

## 2014-01-13 DIAGNOSIS — G4700 Insomnia, unspecified: Secondary | ICD-10-CM | POA: Insufficient documentation

## 2014-01-13 DIAGNOSIS — M542 Cervicalgia: Secondary | ICD-10-CM | POA: Insufficient documentation

## 2014-01-13 DIAGNOSIS — E785 Hyperlipidemia, unspecified: Secondary | ICD-10-CM | POA: Insufficient documentation

## 2014-01-13 DIAGNOSIS — R1013 Epigastric pain: Secondary | ICD-10-CM | POA: Insufficient documentation

## 2014-01-13 DIAGNOSIS — G8929 Other chronic pain: Secondary | ICD-10-CM | POA: Insufficient documentation

## 2014-01-13 DIAGNOSIS — Z8614 Personal history of Methicillin resistant Staphylococcus aureus infection: Secondary | ICD-10-CM | POA: Insufficient documentation

## 2014-01-13 DIAGNOSIS — R109 Unspecified abdominal pain: Secondary | ICD-10-CM

## 2014-01-13 DIAGNOSIS — J4489 Other specified chronic obstructive pulmonary disease: Secondary | ICD-10-CM | POA: Insufficient documentation

## 2014-01-13 DIAGNOSIS — R079 Chest pain, unspecified: Secondary | ICD-10-CM

## 2014-01-13 DIAGNOSIS — E119 Type 2 diabetes mellitus without complications: Secondary | ICD-10-CM | POA: Insufficient documentation

## 2014-01-13 DIAGNOSIS — G4710 Hypersomnia, unspecified: Secondary | ICD-10-CM | POA: Insufficient documentation

## 2014-01-13 DIAGNOSIS — Z87448 Personal history of other diseases of urinary system: Secondary | ICD-10-CM | POA: Insufficient documentation

## 2014-01-13 DIAGNOSIS — Z8679 Personal history of other diseases of the circulatory system: Secondary | ICD-10-CM | POA: Insufficient documentation

## 2014-01-13 DIAGNOSIS — F411 Generalized anxiety disorder: Secondary | ICD-10-CM | POA: Insufficient documentation

## 2014-01-13 DIAGNOSIS — M129 Arthropathy, unspecified: Secondary | ICD-10-CM | POA: Insufficient documentation

## 2014-01-13 DIAGNOSIS — Z464 Encounter for fitting and adjustment of orthodontic device: Secondary | ICD-10-CM | POA: Insufficient documentation

## 2014-01-13 DIAGNOSIS — Z79899 Other long term (current) drug therapy: Secondary | ICD-10-CM | POA: Insufficient documentation

## 2014-01-13 DIAGNOSIS — I739 Peripheral vascular disease, unspecified: Secondary | ICD-10-CM | POA: Insufficient documentation

## 2014-01-13 DIAGNOSIS — F17200 Nicotine dependence, unspecified, uncomplicated: Secondary | ICD-10-CM | POA: Insufficient documentation

## 2014-01-13 DIAGNOSIS — Z888 Allergy status to other drugs, medicaments and biological substances status: Secondary | ICD-10-CM | POA: Insufficient documentation

## 2014-01-13 DIAGNOSIS — I10 Essential (primary) hypertension: Secondary | ICD-10-CM | POA: Insufficient documentation

## 2014-01-13 DIAGNOSIS — Z7982 Long term (current) use of aspirin: Secondary | ICD-10-CM | POA: Insufficient documentation

## 2014-01-13 DIAGNOSIS — J449 Chronic obstructive pulmonary disease, unspecified: Secondary | ICD-10-CM | POA: Insufficient documentation

## 2014-01-13 DIAGNOSIS — Z8719 Personal history of other diseases of the digestive system: Secondary | ICD-10-CM | POA: Insufficient documentation

## 2014-01-13 DIAGNOSIS — Z881 Allergy status to other antibiotic agents status: Secondary | ICD-10-CM | POA: Insufficient documentation

## 2014-01-13 DIAGNOSIS — Z794 Long term (current) use of insulin: Secondary | ICD-10-CM | POA: Insufficient documentation

## 2014-01-13 DIAGNOSIS — Z91040 Latex allergy status: Secondary | ICD-10-CM | POA: Insufficient documentation

## 2014-01-13 DIAGNOSIS — R071 Chest pain on breathing: Secondary | ICD-10-CM | POA: Insufficient documentation

## 2014-01-13 DIAGNOSIS — G4730 Sleep apnea, unspecified: Secondary | ICD-10-CM | POA: Insufficient documentation

## 2014-01-13 LAB — COMPREHENSIVE METABOLIC PANEL
ALBUMIN: 3.7 g/dL (ref 3.5–5.2)
ALK PHOS: 81 U/L (ref 39–117)
ALT: 9 U/L (ref 0–35)
AST: 10 U/L (ref 0–37)
BILIRUBIN TOTAL: 0.3 mg/dL (ref 0.3–1.2)
BUN: 5 mg/dL — AB (ref 6–23)
CHLORIDE: 97 meq/L (ref 96–112)
CO2: 29 meq/L (ref 19–32)
Calcium: 9.4 mg/dL (ref 8.4–10.5)
Creatinine, Ser: 0.65 mg/dL (ref 0.50–1.10)
GLUCOSE: 163 mg/dL — AB (ref 70–99)
POTASSIUM: 3.2 meq/L — AB (ref 3.7–5.3)
Sodium: 141 mEq/L (ref 137–147)
Total Protein: 7.1 g/dL (ref 6.0–8.3)

## 2014-01-13 LAB — CBC WITH DIFFERENTIAL/PLATELET
Basophils Absolute: 0.1 10*3/uL (ref 0.0–0.1)
Basophils Relative: 0 % (ref 0–1)
Eosinophils Absolute: 0.1 10*3/uL (ref 0.0–0.7)
Eosinophils Relative: 1 % (ref 0–5)
HEMATOCRIT: 38 % (ref 36.0–46.0)
HEMOGLOBIN: 13.8 g/dL (ref 12.0–15.0)
Lymphocytes Relative: 39 % (ref 12–46)
Lymphs Abs: 5.1 10*3/uL — ABNORMAL HIGH (ref 0.7–4.0)
MCH: 32 pg (ref 26.0–34.0)
MCHC: 36.3 g/dL — ABNORMAL HIGH (ref 30.0–36.0)
MCV: 88.2 fL (ref 78.0–100.0)
MONO ABS: 0.5 10*3/uL (ref 0.1–1.0)
MONOS PCT: 4 % (ref 3–12)
NEUTROS ABS: 7.4 10*3/uL (ref 1.7–7.7)
Neutrophils Relative %: 56 % (ref 43–77)
Platelets: 278 10*3/uL (ref 150–400)
RBC: 4.31 MIL/uL (ref 3.87–5.11)
RDW: 12.7 % (ref 11.5–15.5)
WBC: 13.2 10*3/uL — ABNORMAL HIGH (ref 4.0–10.5)

## 2014-01-13 LAB — URINALYSIS, ROUTINE W REFLEX MICROSCOPIC
BILIRUBIN URINE: NEGATIVE
Glucose, UA: >1000 mg/dL — AB
Hgb urine dipstick: NEGATIVE
KETONES UR: NEGATIVE mg/dL
Leukocytes, UA: NEGATIVE
NITRITE: NEGATIVE
PROTEIN: NEGATIVE mg/dL
Specific Gravity, Urine: 1.018 (ref 1.005–1.030)
Urobilinogen, UA: 0.2 mg/dL (ref 0.0–1.0)
pH: 7 (ref 5.0–8.0)

## 2014-01-13 LAB — POCT I-STAT TROPONIN I: Troponin i, poc: 0 ng/mL (ref 0.00–0.08)

## 2014-01-13 LAB — URINE MICROSCOPIC-ADD ON

## 2014-01-13 LAB — LIPASE, BLOOD: LIPASE: 21 U/L (ref 11–59)

## 2014-01-13 MED ORDER — FAMOTIDINE IN NACL 20-0.9 MG/50ML-% IV SOLN
20.0000 mg | Freq: Once | INTRAVENOUS | Status: AC
  Administered 2014-01-13: 20 mg via INTRAVENOUS
  Filled 2014-01-13: qty 50

## 2014-01-13 MED ORDER — FENTANYL CITRATE 0.05 MG/ML IJ SOLN: 100.0000 ug | Freq: Once | INTRAMUSCULAR | Status: DC

## 2014-01-13 MED ORDER — PANTOPRAZOLE SODIUM 40 MG IV SOLR
40.0000 mg | Freq: Once | INTRAVENOUS | Status: AC
  Administered 2014-01-13: 40 mg via INTRAVENOUS
  Filled 2014-01-13: qty 40

## 2014-01-13 MED ORDER — FENTANYL CITRATE 0.05 MG/ML IJ SOLN
100.0000 ug | Freq: Once | INTRAMUSCULAR | Status: AC
  Administered 2014-01-13: 100 ug via INTRAVENOUS
  Filled 2014-01-13: qty 2

## 2014-01-13 NOTE — ED Notes (Signed)
Dr. Bednar at bedside. 

## 2014-01-13 NOTE — Discharge Instructions (Signed)
Your caregiver has diagnosed you as having chest pain that is not specific for one problem, but does not require admission.  You are at low risk for an acute heart condition or other serious illness. Chest pain comes from many different causes.  SEEK IMMEDIATE MEDICAL ATTENTION IF: You have severe chest pain, especially if the pain is crushing or pressure-like and spreads to the arms, back, neck, or jaw, or if you have sweating, nausea (feeling sick to your stomach), or shortness of breath. THIS IS AN EMERGENCY. Don't wait to see if the pain will go away. Get medical help at once. Call 911 or 0 (operator). DO NOT drive yourself to the hospital.  Your chest pain gets worse and does not go away with rest.  You have an attack of chest pain lasting longer than usual, despite rest and treatment with the medications your caregiver has prescribed.  You wake from sleep with chest pain or shortness of breath.  You feel dizzy or faint.  You have chest pain not typical of your usual pain for which you originally saw your caregiver.  Abdominal (belly) pain can be caused by many things. Your caregiver performed an examination and possibly ordered blood/urine tests and imaging (CT scan, x-rays, ultrasound). Many cases can be observed and treated at home after initial evaluation in the emergency department. Even though you are being discharged home, abdominal pain can be unpredictable. Therefore, you need a repeated exam if your pain does not resolve, returns, or worsens. Most patients with abdominal pain don't have to be admitted to the hospital or have surgery, but serious problems like appendicitis and gallbladder attacks can start out as nonspecific pain. Many abdominal conditions cannot be diagnosed in one visit, so follow-up evaluations are very important. SEEK IMMEDIATE MEDICAL ATTENTION IF: The pain does not go away or becomes severe.  A temperature above 101 develops.  Repeated vomiting occurs (multiple  episodes).  The pain becomes localized to portions of the abdomen. The right side could possibly be appendicitis. In an adult, the left lower portion of the abdomen could be colitis or diverticulitis.  Blood is being passed in stools or vomit (bright red or black tarry stools).  Return also if you develop chest pain, difficulty breathing, dizziness or fainting, or become confused, poorly responsive, or inconsolable (young children).

## 2014-01-13 NOTE — ED Provider Notes (Signed)
CSN: [id number]     Arrival date & time 01/13/14  1624 History   First MD Initiated Contact with Patient 01/13/14 1735     Chief Complaint  Patient presents with  . Chest Pain   (Consider location/radiation/quality/duration/timing/severity/associated sxs/prior Treatment) HPI 48 year old female chronic generalized pain, chronic peripheral neuropathy, chronic neck pain, chronic back pain, chronic pain all 4 extremities, chronic reflux with chronic belching sour taste the back of her throat, now presents with over 8 hours of constant sharp stabbing well localized moderate nonradiating left lower chest pain and epigastric pain without vomiting or diarrhea without bloody stools, nonexertional nonpleuritic somewhat positional pain worse with palpation, without rash or trauma and no treatment prior to arrival. Her chronic generalized pain and chronic neck and back pain are always severe and stable. No fever, cough, dyspnea. Past Medical History  Diagnosis Date  . Hypertension   . Arthritis   . Sleep apnea     didn't tolerate cpap  . Hyperlipemia   . GERD (gastroesophageal reflux disease)   . Insomnia   . SUI (stress urinary incontinence, female) S/P SLING 12-29-2011  . SOB (shortness of breath) on exertion   . Diabetes mellitus   . Blood transfusion without reported diagnosis   . Bipolar disorder   . Neurogenic bladder   . Hypercholesterolemia   . Gastroparesis   . Ulcer   . MRSA (methicillin resistant Staphylococcus aureus)   . Chronic pain   . Edema   . Chest pain     a. 2008 Cath: normal cors;  b. 12/2013 Lexi CL: EF 47%, no ischemia/infarct.  . Anxiety   . Vertigo   . DJD (degenerative joint disease)   . COPD (chronic obstructive pulmonary disease)   . Cholecystitis   . Hypersomnia   . Tobacco abuse   . Claudication     a. 12/2013 ABI's: R 0.97, L 0.94.  [name] Palpitations    Past Surgical History  Procedure Laterality Date  . Knee arthroscopy w/ allograft impant      LEFT KNEE,  graft x 2  . Foot surgery      bilateral  . Anterior cervical decomp/discectomy fusion  2000    C6 - 7  . Pubovaginal sling  12/29/2011    Procedure: Gaynelle Arabian;  Surgeon: Reece Packer, MD;  Location[facility];  Service: Urology;  Laterality: N/A;  cysto and sparc sling   . Lumbar fusion    . Total abdominal hysterectomy w/ bilateral salpingoophorectomy  1997  . Multiple laparoscopies for endometriosis    . Cesarean section      X2  . Repeat reconstruction acl left knee/ screws removed  03-28-2000    CADAVER GRAFT  . Reconsturction of congenital uterus anomaly  AGE 15  . Knee surgery      TOTAL 8 SURG'S  . Cardiac catheterization  05-22-2008   DR SKAINS    NO SIGNIFECANT CAD/ NORMAL LV/  EF 65%/  NO WALL MOTION ABNORMALITIES  . Cystoscopy with injection  05/04/2012    Procedure: CYSTOSCOPY WITH INJECTION;  Surgeon: Reece Packer, MD;  Location: [facility];  Service: Urology;  Laterality: N/A;  MACROPLASTIQUE INJECTION  . Cystoscopy with injection  08/28/2012    Procedure: CYSTOSCOPY WITH INJECTION;  Surgeon: Reece Packer, MD;  Location: [facility];  Service: Urology;  Laterality: N/A;  cysto and macroplastique   . Right carpal tunnel surgery  04-25-2013  . Cysto N/A 04/30/2013  Procedure: CYSTOSCOPY;  Surgeon: Reece Packer, MD;  Location: [facility];  Service: Urology;  Laterality: N/A;  . Pubovaginal sling N/A 04/30/2013    Procedure: REMOVAL OF VAGINAL MESH;  Surgeon: Reece Packer, MD;  Location: [facility];  Service: Urology;  Laterality: N/A;   Family History  Problem Relation Age of Onset  . Hypertension Mother   . Diabetes Mother    History  Substance Use Topics  . Smoking status: Current Every Day Smoker -- 3.00 packs/day for 35 years    Types: Cigarettes  . Smokeless tobacco: Never Used     Comment: quit for a little while and used a vaporizer but now back to cigarettes b/c vap is broken.  . Alcohol  Use: No   OB History   Grav Para Term Preterm Abortions TAB SAB Ect Mult Living                 Review of Systems 10 Systems reviewed and are negative for acute change except as noted in the HPI. Allergies  Latex; Ibuprofen; Mirtazapine; Sweetening enhancer; Tetracyclines & related; Trazodone and nefazodone; Triazolam; and Aspartame and phenylalanine  Home Medications   Current Outpatient Rx  Name  Route  Sig  Dispense  Refill  . alprazolam (XANAX) 2 MG tablet   Oral   Take 2 mg by mouth 2 (two) times daily as needed for sleep or anxiety. For anxiety         . amphetamine-dextroamphetamine (ADDERALL) 20 MG tablet   Oral   Take 40 mg by mouth daily before breakfast.          . chlorproMAZINE (THORAZINE) 100 MG tablet   Oral   Take 100 mg by mouth 3 (three) times daily.         . clonazePAM (KLONOPIN) 2 MG tablet   Oral   Take 4 mg by mouth at bedtime. For sleep         . diazepam (VALIUM) 10 MG tablet   Oral   Take 20 mg by mouth at bedtime. For sleep         . doxepin (SINEQUAN) 75 MG capsule   Oral   Take 150 mg by mouth at bedtime.         [name] esomeprazole (NEXIUM) 40 MG capsule   Oral   Take 40 mg by mouth at bedtime.          . gabapentin (NEURONTIN) 300 MG capsule   Oral   Take 1 capsule (300 mg total) by mouth 3 (three) times daily.   90 capsule   3   . insulin aspart (NOVOLOG) 100 UNIT/ML injection      To use with V-Go 40 Pump         . Insulin Disposable Pump (V-GO 40) KIT   Does not apply   by Does not apply route.         . Oxcarbazepine (TRILEPTAL) 300 MG tablet   Oral   Take 900 mg by mouth at bedtime.          [name] oxycodone (OXY-IR) 5 MG capsule   Oral   Take 5 mg by mouth every 4 (four) hours as needed for pain.         . ramelteon (ROZEREM) 8 MG tablet   Oral   Take 16 mg by mouth at bedtime.          [name] amitriptyline (ELAVIL) 100 MG tablet   Oral   Take  100 mg by mouth at bedtime.         [name] aspirin 325 MG  tablet   Oral   Take 325 mg by mouth daily as needed for headache.         . Canagliflozin (INVOKANA) 100 MG TABS   Oral   Take 100 mg by mouth daily.         . Levomilnacipran HCl ER (FETZIMA) 20 MG CP24   Oral   Take 20 mg by mouth daily.         [name] spironolactone-hydrochlorothiazide (ALDACTAZIDE) 25-25 MG per tablet   Oral   Take 1 tablet by mouth daily.          BP 158/81  Pulse 87  Temp(Src) 98.9 F (37.2 C) (Oral)  Resp 14  SpO2 97% Physical Exam  Nursing note and vitals reviewed. Constitutional:  Awake, alert, nontoxic appearance.  HENT:  Head: Atraumatic.  Eyes: Right eye exhibits no discharge. Left eye exhibits no discharge.  Neck: Neck supple.  Cardiovascular: Normal rate and regular rhythm.   No murmur heard. Pulmonary/Chest: Effort normal and breath sounds normal. No respiratory distress. She has no wheezes. She has no rales. She exhibits tenderness.  Reproducible left anterior lower chest wall tenderness without rash; PERC negative  Abdominal: Soft. Bowel sounds are normal. She exhibits no distension and no mass. There is tenderness. There is no rebound and no guarding.  Minimal epigastric tenderness the rest of the abdomen is nontender  Musculoskeletal: She exhibits tenderness. She exhibits no edema.  Baseline ROM, no obvious new focal weakness. Baseline generalized tenderness to entire posterior neck back in all 4 extremities.  Neurological: She is alert.  Mental status and motor strength appears baseline for patient and situation.  Skin: No rash noted.  Psychiatric: She has a normal mood and affect.    ED Course  Procedures (including critical care time) Patient / Family / Caregiver informed of clinical course, understand medical decision-making process, and agree with plan. Labs Review Labs Reviewed  CBC WITH DIFFERENTIAL - Abnormal; Notable for the following:    WBC 13.2 (*)    MCHC 36.3 (*)    Lymphs Abs 5.1 (*)    All other components  within normal limits  COMPREHENSIVE METABOLIC PANEL - Abnormal; Notable for the following:    Potassium 3.2 (*)    Glucose, Bld 163 (*)    BUN 5 (*)    All other components within normal limits  URINALYSIS, ROUTINE W REFLEX MICROSCOPIC - Abnormal; Notable for the following:    Glucose, UA >1000 (*)    All other components within normal limits  URINE MICROSCOPIC-ADD ON - Abnormal; Notable for the following:    Squamous Epithelial / LPF FEW (*)    All other components within normal limits  LIPASE, BLOOD  POCT I-STAT TROPONIN I   Imaging Review No results found.  EKG Interpretation   None      ECG Muse interface not working: Sinus rhythm, ventricular rate 90, normal axis, prolonged QT interval, no significant change compared with prior ECG MDM   1. Chest pain   2. Abdominal pain    I doubt any other EMC precluding discharge at this time including, but not necessarily limited to the following:ACS.    Babette Relic, MD 01/16/14 2024

## 2014-01-13 NOTE — ED Notes (Signed)
Ultrasound at bedside

## 2014-01-13 NOTE — ED Notes (Signed)
Pt from home, c/o cp recurrent since 10 am. Pt states pain 4/10 left chest. Pt intial bp 240/140 per ems. Pt states she is non compliant with medications. Per EMS, pt was taken to [facility] for confusion. Per EMS pt alert and oriented w/ there arrival

## 2014-01-14 ENCOUNTER — Encounter (HOSPITAL_COMMUNITY): Payer: Self-pay | Admitting: Emergency Medicine

## 2014-01-14 ENCOUNTER — Other Ambulatory Visit: Payer: Self-pay | Admitting: Nurse Practitioner

## 2014-01-14 ENCOUNTER — Telehealth: Payer: Self-pay | Admitting: Nurse Practitioner

## 2014-01-14 ENCOUNTER — Emergency Department (HOSPITAL_COMMUNITY): Payer: Medicare Other

## 2014-01-14 ENCOUNTER — Emergency Department (HOSPITAL_COMMUNITY)
Admission: EM | Admit: 2014-01-14 | Discharge: 2014-01-14 | Disposition: A | Discharge status: Home / Self Care | Payer: Medicare Other | Attending: Emergency Medicine | Admitting: Emergency Medicine

## 2014-01-14 DIAGNOSIS — R002 Palpitations: Secondary | ICD-10-CM

## 2014-01-14 DIAGNOSIS — Z91040 Latex allergy status: Secondary | ICD-10-CM | POA: Insufficient documentation

## 2014-01-14 DIAGNOSIS — Z794 Long term (current) use of insulin: Secondary | ICD-10-CM | POA: Insufficient documentation

## 2014-01-14 DIAGNOSIS — Z7982 Long term (current) use of aspirin: Secondary | ICD-10-CM | POA: Insufficient documentation

## 2014-01-14 DIAGNOSIS — F319 Bipolar disorder, unspecified: Secondary | ICD-10-CM | POA: Insufficient documentation

## 2014-01-14 DIAGNOSIS — Z79899 Other long term (current) drug therapy: Secondary | ICD-10-CM | POA: Insufficient documentation

## 2014-01-14 DIAGNOSIS — M1990 Unspecified osteoarthritis, unspecified site: Secondary | ICD-10-CM | POA: Insufficient documentation

## 2014-01-14 DIAGNOSIS — K219 Gastro-esophageal reflux disease without esophagitis: Secondary | ICD-10-CM | POA: Insufficient documentation

## 2014-01-14 DIAGNOSIS — E1149 Type 2 diabetes mellitus with other diabetic neurological complication: Secondary | ICD-10-CM

## 2014-01-14 DIAGNOSIS — E119 Type 2 diabetes mellitus without complications: Secondary | ICD-10-CM | POA: Insufficient documentation

## 2014-01-14 DIAGNOSIS — E7800 Pure hypercholesterolemia, unspecified: Secondary | ICD-10-CM

## 2014-01-14 DIAGNOSIS — E785 Hyperlipidemia, unspecified: Secondary | ICD-10-CM

## 2014-01-14 DIAGNOSIS — F411 Generalized anxiety disorder: Secondary | ICD-10-CM | POA: Insufficient documentation

## 2014-01-14 DIAGNOSIS — I10 Essential (primary) hypertension: Secondary | ICD-10-CM

## 2014-01-14 DIAGNOSIS — G8929 Other chronic pain: Secondary | ICD-10-CM | POA: Insufficient documentation

## 2014-01-14 DIAGNOSIS — R079 Chest pain, unspecified: Secondary | ICD-10-CM

## 2014-01-14 DIAGNOSIS — Z8614 Personal history of Methicillin resistant Staphylococcus aureus infection: Secondary | ICD-10-CM | POA: Insufficient documentation

## 2014-01-14 DIAGNOSIS — Z8742 Personal history of other diseases of the female genital tract: Secondary | ICD-10-CM | POA: Insufficient documentation

## 2014-01-14 DIAGNOSIS — Z87891 Personal history of nicotine dependence: Secondary | ICD-10-CM | POA: Insufficient documentation

## 2014-01-14 DIAGNOSIS — J441 Chronic obstructive pulmonary disease with (acute) exacerbation: Secondary | ICD-10-CM | POA: Insufficient documentation

## 2014-01-14 DIAGNOSIS — Z95818 Presence of other cardiac implants and grafts: Secondary | ICD-10-CM | POA: Insufficient documentation

## 2014-01-14 DIAGNOSIS — G4730 Sleep apnea, unspecified: Secondary | ICD-10-CM | POA: Insufficient documentation

## 2014-01-14 DIAGNOSIS — Z872 Personal history of diseases of the skin and subcutaneous tissue: Secondary | ICD-10-CM | POA: Insufficient documentation

## 2014-01-14 HISTORY — DX: Peripheral vascular disease, unspecified: I73.9

## 2014-01-14 HISTORY — DX: Bipolar disorder, unspecified: F31.9

## 2014-01-14 LAB — POCT I-STAT TROPONIN I: TROPONIN I, POC: 0 ng/mL (ref 0.00–0.08)

## 2014-01-14 MED ORDER — HYDROCODONE-ACETAMINOPHEN 5-325 MG PO TABS
1.0000 | ORAL_TABLET | Freq: Once | ORAL | Status: AC
  Administered 2014-01-14: 1 via ORAL
  Filled 2014-01-14: qty 1

## 2014-01-14 NOTE — Telephone Encounter (Signed)
Pts boyfriend, Etta Quill, called this AM to report that pt has been having more sharp, needle-like chest pain.  She recently had a low-risk cardioilte (1/15) and was seen in the ER yesterday due to sharp c/p.  Troponin was normal and ECG was non-acute.  She was discharged home.  She developed more sharp chest pain this AM, that has been ongoing for 50 mins now.  I rec that they present back to the ED for evaluation.  Mr. Tamala Julian verbalized understanding.

## 2014-01-14 NOTE — Discharge Instructions (Signed)
Chest Pain (Nonspecific) °It is often hard to give a specific diagnosis for the cause of chest pain. There is always a chance that your pain could be related to something serious, such as a heart attack or a blood clot in the lungs. You need to follow up with your caregiver for further evaluation. °CAUSES  °· Heartburn. °· Pneumonia or bronchitis. °· Anxiety or stress. °· Inflammation around your heart (pericarditis) or lung (pleuritis or pleurisy). °· A blood clot in the lung. °· A collapsed lung (pneumothorax). It can develop suddenly on its own (spontaneous pneumothorax) or from injury (trauma) to the chest. °· Shingles infection (herpes zoster virus). °The chest wall is composed of bones, muscles, and cartilage. Any of these can be the source of the pain. °· The bones can be bruised by injury. °· The muscles or cartilage can be strained by coughing or overwork. °· The cartilage can be affected by inflammation and become sore (costochondritis). °DIAGNOSIS  °Lab tests or other studies, such as X-rays, electrocardiography, stress testing, or cardiac imaging, may be needed to find the cause of your pain.  °TREATMENT  °· Treatment depends on what may be causing your chest pain. Treatment may include: °· Acid blockers for heartburn. °· Anti-inflammatory medicine. °· Pain medicine for inflammatory conditions. °· Antibiotics if an infection is present. °· You may be advised to change lifestyle habits. This includes stopping smoking and avoiding alcohol, caffeine, and chocolate. °· You may be advised to keep your head raised (elevated) when sleeping. This reduces the chance of acid going backward from your stomach into your esophagus. °· Most of the time, nonspecific chest pain will improve within 2 to 3 days with rest and mild pain medicine. °HOME CARE INSTRUCTIONS  °· If antibiotics were prescribed, take your antibiotics as directed. Finish them even if you start to feel better. °· For the next few days, avoid physical  activities that bring on chest pain. Continue physical activities as directed. °· Do not smoke. °· Avoid drinking alcohol. °· Only take over-the-counter or prescription medicine for pain, discomfort, or fever as directed by your caregiver. °· Follow your caregiver's suggestions for further testing if your chest pain does not go away. °· Keep any follow-up appointments you made. If you do not go to an appointment, you could develop lasting (chronic) problems with pain. If there is any problem keeping an appointment, you must call to reschedule. °SEEK MEDICAL CARE IF:  °· You think you are having problems from the medicine you are taking. Read your medicine instructions carefully. °· Your chest pain does not go away, even after treatment. °· You develop a rash with blisters on your chest. °SEEK IMMEDIATE MEDICAL CARE IF:  °· You have increased chest pain or pain that spreads to your arm, neck, jaw, back, or abdomen. °· You develop shortness of breath, an increasing cough, or you are coughing up blood. °· You have severe back or abdominal pain, feel nauseous, or vomit. °· You develop severe weakness, fainting, or chills. °· You have a fever. °THIS IS AN EMERGENCY. Do not wait to see if the pain will go away. Get medical help at once. Call your local emergency services (911 in [place] Do not drive yourself to the hospital. °MAKE SURE YOU:  °· Understand these instructions. °· Will watch your condition. °· Will get help right away if you are not doing well or get worse. °Document Released: 09/21/2005 Document Revised: 03/05/2012 Document Reviewed: 07/17/2008 °[employer]® Patient Information ©2014 [employer]   [employer]. ° °

## 2014-01-14 NOTE — ED Notes (Signed)
Pt reports she has taken narcotics for decades and they do not help.

## 2014-01-14 NOTE — ED Notes (Signed)
Pt has been having pain in her heart that started since yesterday, pt has had several intermittent episodes then had episode of left arm tingling.

## 2014-01-14 NOTE — ED Notes (Signed)
RN went to reassess pain; pt and husband not in room; side rails still up in bed.

## 2014-01-14 NOTE — ED Notes (Signed)
PT ambulated with baseline gait; VSS; A&Ox3; no signs of distress; respirations even and unlabored; skin warm and dry; no questions upon discharge.  

## 2014-01-14 NOTE — ED Notes (Signed)
Sec called cardiology to get time frame for their consult. MD OTW. EDP aware.

## 2014-01-14 NOTE — ED Notes (Signed)
Pt reports that she has "heart pain"; not chest pain. Has had work ups previously for it. States it is nonradiating. Reports that it is stabbing. Did have left arm tingling this morning that went away.

## 2014-01-14 NOTE — ED Notes (Signed)
PT reports episode of dizziness; MD aware.

## 2014-01-14 NOTE — ED Provider Notes (Signed)
CSN: [id number]     Arrival date & time 01/14/14  4098 History   First MD Initiated Contact with Patient 01/14/14 [phone]     Chief Complaint  Patient presents with  . Chest Pain   (Consider location/radiation/quality/duration/timing/severity/associated sxs/prior Treatment) Patient is a 48 y.o. female presenting with chest pain. The history is provided by the patient and a significant other. No language interpreter was used.  Chest Pain Associated symptoms: shortness of breath   Associated symptoms: no abdominal pain, no cough, no diaphoresis, no dizziness, no fever, no nausea and not vomiting     Pt is a 48 yo female with hx of COPD and chronic chest pain.  She states the pain has been going on for several months and that she was seen yesterday in the ER for "pain in her heart"  With identical complaints today.  She describes one episode of left arm tingling yesterday, but none currently.  The pain is sharp and non radiating. Pt is not relieved or worsened by leaning forward. The pt denies N/V/D, fever, chills, or any drug use.  She also denies any recent sick contacts.  She does complain of SOB, and states that it "is difficult to yawn" and that she has trouble taking a deep breath.  Past Medical History  Diagnosis Date  . Hypertension   . Arthritis   . Anxiety   . Sleep apnea     didn't tolerate cpap  . Hyperlipemia   . GERD (gastroesophageal reflux disease)   . Insomnia   . SUI (stress urinary incontinence, female) S/P SLING 12-29-2011  . SOB (shortness of breath) on exertion   . Diabetes mellitus   . Blood transfusion without reported diagnosis   . Mental disorder     bipolar  . Depression   . Neurogenic bladder   . Hypercholesterolemia   . Gastroparesis   . Ulcer   . MRSA (methicillin resistant Staphylococcus aureus)   . Chronic pain   . Edema   . Abnormal heart rhythm   . Chest pain   . Bipolar 1 disorder   . Depression   . Anxiety   . Vertigo   . DJD (degenerative  joint disease)   . COPD (chronic obstructive pulmonary disease)   . Cholecystitis   . Hypersomnia    Past Surgical History  Procedure Laterality Date  . Knee arthroscopy w/ allograft impant      LEFT KNEE, graft x 2  . Foot surgery      bilateral  . Anterior cervical decomp/discectomy fusion  2000    C6 - 7  . Pubovaginal sling  12/29/2011    Procedure: Gaynelle Arabian;  Surgeon: Reece Packer, MD;  Location: [facility];  Service: Urology;  Laterality: N/A;  cysto and sparc sling   . Lumbar fusion    . Total abdominal hysterectomy w/ bilateral salpingoophorectomy  1997  . Multiple laparoscopies for endometriosis    . Cesarean section      X2  . Repeat reconstruction acl left knee/ screws removed  03-28-2000    CADAVER GRAFT  . Reconsturction of congenital uterus anomaly  AGE 5  . Knee surgery      TOTAL 8 SURG'S  . Cardiac catheterization  05-22-2008   DR SKAINS    NO SIGNIFECANT CAD/ NORMAL LV/  EF 65%/  NO WALL MOTION ABNORMALITIES  . Cystoscopy with injection  05/04/2012    Procedure: CYSTOSCOPY WITH INJECTION;  Surgeon: Reece Packer, MD;  Location: [facility];  Service: Urology;  Laterality: N/A;  MACROPLASTIQUE INJECTION  . Cystoscopy with injection  08/28/2012    Procedure: CYSTOSCOPY WITH INJECTION;  Surgeon: Reece Packer, MD;  Location: [facility];  Service: Urology;  Laterality: N/A;  cysto and macroplastique   . Right carpal tunnel surgery  04-25-2013  . Cysto N/A 04/30/2013    Procedure: CYSTOSCOPY;  Surgeon: Reece Packer, MD;  Location: [facility];  Service: Urology;  Laterality: N/A;  . Pubovaginal sling N/A 04/30/2013    Procedure: REMOVAL OF VAGINAL MESH;  Surgeon: Reece Packer, MD;  Location: [facility];  Service: Urology;  Laterality: N/A;   Family History  Problem Relation Age of Onset  . Hypertension Mother   . Diabetes Mother    History  Substance Use Topics  . Smoking status: Former Smoker  -- 3.00 packs/day for 35 years    Types: Cigarettes    Quit date: 10/22/2012  . Smokeless tobacco: Never Used  . Alcohol Use: No   OB History   Grav Para Term Preterm Abortions TAB SAB Ect Mult Living                 Review of Systems  Constitutional: Negative for fever, chills, diaphoresis and unexpected weight change.  Respiratory: Positive for shortness of breath. Negative for cough, chest tightness and wheezing.   Cardiovascular: Positive for chest pain.  Gastrointestinal: Negative for nausea, vomiting, abdominal pain and diarrhea.  Genitourinary: Negative.   Neurological: Negative for dizziness and light-headedness.    Allergies  Latex; Ibuprofen; Sweetening enhancer; Trazodone and nefazodone; and Triazolam  Home Medications   Current Outpatient Rx  Name  Route  Sig  Dispense  Refill  . alprazolam (XANAX) 2 MG tablet   Oral   Take 2 mg by mouth at bedtime as needed for sleep. For anxiety         . amitriptyline (ELAVIL) 50 MG tablet   Oral   Take 150 mg by mouth at bedtime.          [name] amphetamine-dextroamphetamine (ADDERALL) 20 MG tablet   Oral   Take 20 mg by mouth daily before breakfast.          . aspirin 81 MG tablet   Oral   Take 324 mg by mouth once.         . Canagliflozin 100 MG TABS   Oral   Take 1 tablet (100 mg total) by mouth daily.   30 tablet   5     This is to replace Farxiga, please let the patient ...   . chlorproMAZINE (THORAZINE) 100 MG tablet   Oral   Take 100 mg by mouth 3 (three) times daily.         . clonazePAM (KLONOPIN) 2 MG tablet   Oral   Take 2 mg by mouth at bedtime as needed. For sleep         . diazepam (VALIUM) 10 MG tablet   Oral   Take 20 mg by mouth at bedtime as needed. For sleep         . doxepin (SINEQUAN) 75 MG capsule   Oral   Take 150 mg by mouth at bedtime.         [name] esomeprazole (NEXIUM) 40 MG capsule   Oral   Take 40 mg by mouth at bedtime.          [name] FETZIMA 40 MG CP24  40  mg daily.          [name] gabapentin (NEURONTIN) 300 MG capsule   Oral   Take 1 capsule (300 mg total) by mouth 3 (three) times daily.   90 capsule   3   . insulin aspart (NOVOLOG) 100 UNIT/ML injection      To use with V-Go 40 Pump         . Insulin Disposable Pump (V-GO 40) KIT   Does not apply   by Does not apply route.         . nitroGLYCERIN (NITROSTAT) 0.4 MG SL tablet   Sublingual   Place 0.4 mg under the tongue every 5 (five) minutes as needed for chest pain.         . Oxcarbazepine (TRILEPTAL) 300 MG tablet   Oral   Take 900 mg by mouth at bedtime.          [name] oxycodone (OXY-IR) 5 MG capsule   Oral   Take 5 mg by mouth every 4 (four) hours as needed for pain.         . potassium chloride SA (K-DUR,KLOR-CON) 20 MEQ tablet   Oral   Take 1 tablet (20 mEq total) by mouth once.   30 tablet   3   . ramelteon (ROZEREM) 8 MG tablet   Oral   Take 16 mg by mouth at bedtime.          [name] spironolactone (ALDACTONE) 100 MG tablet   Oral   Take 1 tablet (100 mg total) by mouth daily.   30 tablet   5    BP 158/84  Pulse 62  Temp(Src) 98 F (36.7 C)  Resp 17  Wt 190 lb (86.183 kg)  SpO2 100% Physical Exam  Constitutional: She is oriented to person, place, and time. She appears well-developed and well-nourished.  Pulmonary/Chest: Breath sounds normal. She has no wheezes. She exhibits tenderness.  Abdominal: Soft. Bowel sounds are normal. There is no tenderness.  Musculoskeletal: Normal range of motion.  Neurological: She is alert and oriented to person, place, and time.  Skin: Skin is warm and dry. She is not diaphoretic.  Psychiatric: She has a normal mood and affect.    ED Course  Procedures (including critical care time) Labs Review Labs Reviewed - No data to display Imaging Review Dg Chest 2 View  01/14/2014   CLINICAL DATA:  The mid chest pain, difficulty breathing  EXAM: CHEST  2 VIEW  COMPARISON:  Prior chest x-ray and rib series 227 2014   FINDINGS: Cardiac and mediastinal contours are unchanged and remain within normal limits. Stable mild central bronchitic changes. No pneumothorax, pleural effusion or focal airspace consolidation. Incompletely imaged anterior cervical fusion hardware at C6-C7. No acute osseous abnormality. Mild degenerative spurring in the distal thoracic spine.  IMPRESSION: No active cardiopulmonary disease.   Electronically Signed   By: Jacqulynn Cadet M.D.   On: 01/14/2014 09:24   US Abdomen Complete  01/13/2014   CLINICAL DATA:  Epigastric abdominal pain, history of GERD  EXAM: ULTRASOUND ABDOMEN COMPLETE  COMPARISON:  Abdominal ultrasound -11/05/2010; 09/29/2008 ; CT abdomen pelvis -01/04/2005  FINDINGS: Gallbladder:  Sonographically normal. No echogenic gallstones or gall sludge. No gallbladder wall thickening or pericholecystic fluid. Negative sonographic Murphy's sign.  Common bile duct:  Diameter: Normal in size measuring 6 mm in diameter  Liver:  There is diffuse increase slightly coarsened echogenicity of the hepatic parenchyma, grossly unchanged and again suggestive of hepatic steatosis.  There is possible minimal amount of focal fatty sparing adjacent to the gallbladder fossa. No definite intrahepatic biliary duct dilatation. No ascites.  IVC:  No abnormality visualized.  Pancreas:  Limited visualization of the pancreatic head and neck is normal. Visualization of the pancreatic body and tail is obscured by bowel gas.  Spleen:  Normal in size measuring 11.9 cm in length  Right Kidney:  Normal cortical thickness, echogenicity and size, measuring 12.5 cm in length. No focal renal lesions. No echogenic renal stones. No urinary obstruction.  Left Kidney:  Normal cortical thickness, echogenicity and size, measuring 11.7 cm in length. No focal renal lesions. No echogenic renal stones. No urinary obstruction.  Abdominal aorta:  No aneurysm visualized.  Other findings:  None.  IMPRESSION: 1. No explanation for patient's  epigastric abdominal pain. Specifically, no evidence of cholelithiasis or urinary obstruction. 2. Grossly unchanged findings suggestive of hepatic steatosis.   Electronically Signed   By: Sandi Mariscal M.D.   On: 01/13/2014 20:45    EKG Interpretation    Date/Time:  Tuesday January 14 2014 09:00:58 EST Ventricular Rate:  99 PR Interval:  140 QRS Duration: 84 QT Interval:  404 QTC Calculation: 518 R Axis:   75 Text Interpretation:  Normal sinus rhythm Prolonged QT Abnormal ECG Sinus rhythm Artifact Abnormal ekg Confirmed by Carmin Muskrat  MD ([id number]) on 01/14/2014 9:59:37 AM            MDM  No diagnosis found. 1. Chest pain  Pt stable on multiple reevaluations. Discharge pending cardiac evaluation, who was delayed on way to ED.    The patient is persistently belligerent with staff throughout her visit. She is found having oral sexual relations with her female visitor on one re-evaluation. She is drinking fluids in the room without difficulty or change to symptoms of complaint. She appears stable and in no eminent threat of physical decline.  Discussed with cardiologist who will schedule patient for outpatient ECHO but is felt stable for discharge home at this time.   Dewaine Oats, PA-C 01/15/14 1554

## 2014-01-14 NOTE — ED Notes (Signed)
Pt updated on POC; RN informed pt she will talk to EDP to get her more comfortable.

## 2014-01-14 NOTE — ED Notes (Signed)
EDP made aware that pt was having episodes of PVCs.

## 2014-01-14 NOTE — ED Notes (Addendum)
Cardiologist at bedside; pt is agitated and aggressive; wants explanation for her episodes. MD explained that she will be further evaluated on outpatient basis.

## 2014-01-14 NOTE — ED Notes (Signed)
NOTIFIED PHYSICIAN ON POD - A OF PATIENTS LAB RESULTS OF I-STAT TROPONIN ,@11 :30 AM ,01/14/2014.

## 2014-01-14 NOTE — ED Notes (Signed)
RN discussed with EDP pain meds or GI cocktail for pt.

## 2014-01-14 NOTE — Consult Note (Signed)
CARDIOLOGY CONSULT NOTE   Patient ID: [name] MRN: [id number], DOB/AGE: 1966/11/08   Admit date: 01/14/2014 Date of Consult: 01/14/2014   Primary Physician: Tamsen Roers, MD Primary Cardiologist: Jerilynn Mages. Marlou Porch, MD   Pt. Profile  48 year old female with history of chronic pain and nonobstructive coronary artery disease by catheterization in 2008 who presents to the ED for the second day in a row with recurrent focal left-sided chest pain.  Problem List  Past Medical History  Diagnosis Date  . Hypertension   . Arthritis   . Sleep apnea     didn't tolerate cpap  . Hyperlipemia   . GERD (gastroesophageal reflux disease)   . Insomnia   . SUI (stress urinary incontinence, female) S/P SLING 12-29-2011  . SOB (shortness of breath) on exertion   . Diabetes mellitus   . Blood transfusion without reported diagnosis   . Bipolar disorder   . Neurogenic bladder   . Hypercholesterolemia   . Gastroparesis   . Ulcer   . MRSA (methicillin resistant Staphylococcus aureus)   . Chronic pain   . Edema   . Chest pain     a. 2008 Cath: normal cors;  b. 12/2013 Lexi CL: EF 47%, no ischemia/infarct.  . Anxiety   . Vertigo   . DJD (degenerative joint disease)   . COPD (chronic obstructive pulmonary disease)   . Cholecystitis   . Hypersomnia   . Tobacco abuse   . Claudication     a. 12/2013 ABI's: R 0.97, L 0.94.  [name] Palpitations     Past Surgical History  Procedure Laterality Date  . Knee arthroscopy w/ allograft impant      LEFT KNEE, graft x 2  . Foot surgery      bilateral  . Anterior cervical decomp/discectomy fusion  2000    C6 - 7  . Pubovaginal sling  12/29/2011    Procedure: Gaynelle Arabian;  Surgeon: Reece Packer, MD;  Location: [facility];  Service: Urology;  Laterality: N/A;  cysto and sparc sling   . Lumbar fusion    . Total abdominal hysterectomy w/ bilateral salpingoophorectomy  1997  . Multiple laparoscopies for endometriosis    . Cesarean  section      X2  . Repeat reconstruction acl left knee/ screws removed  03-28-2000    CADAVER GRAFT  . Reconsturction of congenital uterus anomaly  AGE 29  . Knee surgery      TOTAL 8 SURG'S  . Cardiac catheterization  05-22-2008   DR SKAINS    NO SIGNIFECANT CAD/ NORMAL LV/  EF 65%/  NO WALL MOTION ABNORMALITIES  . Cystoscopy with injection  05/04/2012    Procedure: CYSTOSCOPY WITH INJECTION;  Surgeon: Reece Packer, MD;  Location: [facility];  Service: Urology;  Laterality: N/A;  MACROPLASTIQUE INJECTION  . Cystoscopy with injection  08/28/2012    Procedure: CYSTOSCOPY WITH INJECTION;  Surgeon: Reece Packer, MD;  Location: [facility];  Service: Urology;  Laterality: N/A;  cysto and macroplastique   . Right carpal tunnel surgery  04-25-2013  . Cysto N/A 04/30/2013    Procedure: CYSTOSCOPY;  Surgeon: Reece Packer, MD;  Location: [facility];  Service: Urology;  Laterality: N/A;  . Pubovaginal sling N/A 04/30/2013    Procedure: REMOVAL OF VAGINAL MESH;  Surgeon: Reece Packer, MD;  Location: [facility];  Service: Urology;  Laterality: N/A;     Allergies  Allergies  Allergen Reactions  . Latex  Itching, Rash and Other (See Comments)    Pt states she cannot use condoms - cause an infection.  Use of latex on skin is okay.  Tape causes rash  . Ibuprofen Other (See Comments)    headaches  . Mirtazapine   . Sweetening Enhancer [Flavoring Agent] Nausea And Vomiting    And headaches  . Tetracyclines & Related   . Trazodone And Nefazodone Other (See Comments)    Hallucinations   . Triazolam Other (See Comments)    hallucinations  . Aspartame And Phenylalanine Nausea And Vomiting    headaches    HPI   48 year old female with the above complex problem list. Patient has a long history of chronic pain and underwent diagnostic catheterization in 2008 secondary to chest pain and this showed nonobstructive coronary artery disease. Several months ago, she  developed a constant throbbing chest ache as well as a more focal left-sided chest pain under her left breast, which has also been constant in nature. The more focal pain is the more bothersome to her and is somewhat worse with palpation. The focal pain will increase in severity/intensity periodically. She was recently seen by Dr. Marlou Porch  in clinic and she reported this chest pain as well as pain in her legs with ambulation.  As result, stress testing was undertaken on January 15 and was nonischemic. EF was read out at 47% and recommendation was made to obtain a 2-D echocardiogram to reevaluate LV function. ABIs were also performed and were normal. Patient had not yet been informed of these results and yesterday had worsening focal left chest pain and presented to the ED. Here, ECG was nonacute and troponin was normal. In light of recent negative stress testing and absence of objective evidence of ischemia, she was discharged home from the ED. This morning, her boyfriend called me to indicate that she is having more severe left-sided chest pain. He drove her into the emergency department where she's continued to complain of constant retrosternal pain and also this more focal pain under her left breast. She also now reports intermittent episodes of her heart going out of rhythm which she describes as tachycardia and separately, episodes of feeling as though her heart is missing beats. When missed beats occurring she says her chest feels like the way one feels as if elevator comes to a stop and this is sometimes associated with lightheadedness. Nursing staff indicated that she had some PVCs however I reviewed both her alarms and also all of her telemetry was she's been here in the ED and have not seen any PVCs. She has had some artifact. Despite several months of chest aching and a more focal pain, her troponin remains normal and ECG is nonacute.   Inpatient Medications  Prior to Admission medications     Medication Sig Start Date End Date Taking? Authorizing Provider  alprazolam Duanne Moron) 2 MG tablet Take 2 mg by mouth 2 (two) times daily as needed for sleep or anxiety. For anxiety   Yes Historical Provider, MD  amitriptyline (ELAVIL) 100 MG tablet Take 100 mg by mouth at bedtime.   Yes Historical Provider, MD  amphetamine-dextroamphetamine (ADDERALL) 20 MG tablet Take 40 mg by mouth daily before breakfast.    Yes Historical Provider, MD  aspirin 325 MG tablet Take 325 mg by mouth daily as needed for headache.   Yes Historical Provider, MD  Canagliflozin (INVOKANA) 100 MG TABS Take 100 mg by mouth daily.   Yes Historical Provider, MD  chlorproMAZINE (THORAZINE)  100 MG tablet Take 100 mg by mouth 3 (three) times daily.   Yes Historical Provider, MD  clonazePAM (KLONOPIN) 2 MG tablet Take 4 mg by mouth at bedtime. For sleep   Yes Historical Provider, MD  diazepam (VALIUM) 10 MG tablet Take 20 mg by mouth at bedtime. For sleep   Yes Historical Provider, MD  doxepin (SINEQUAN) 75 MG capsule Take 150 mg by mouth at bedtime.   Yes Historical Provider, MD  esomeprazole (NEXIUM) 40 MG capsule Take 40 mg by mouth at bedtime.    Yes Historical Provider, MD  gabapentin (NEURONTIN) 300 MG capsule Take 1 capsule (300 mg total) by mouth 3 (three) times daily. 07/22/13  Yes Elayne Snare, MD  insulin aspart (NOVOLOG) 100 UNIT/ML injection To use with V-Go 40 Pump 07/22/13  Yes Elayne Snare, MD  Insulin Disposable Pump (V-GO 40) KIT by Does not apply route.   Yes Historical Provider, MD  Levomilnacipran HCl ER (FETZIMA) 20 MG CP24 Take 20 mg by mouth daily.   Yes Historical Provider, MD  Oxcarbazepine (TRILEPTAL) 300 MG tablet Take 900 mg by mouth at bedtime.    Yes Historical Provider, MD  oxycodone (OXY-IR) 5 MG capsule Take 5 mg by mouth every 4 (four) hours as needed for pain.   Yes Historical Provider, MD  ramelteon (ROZEREM) 8 MG tablet Take 16 mg by mouth at bedtime.    Yes Historical Provider, MD   spironolactone-hydrochlorothiazide (ALDACTAZIDE) 25-25 MG per tablet Take 1 tablet by mouth daily.   Yes Historical Provider, MD    Family History Family History  Problem Relation Age of Onset  . Hypertension Mother   . Diabetes Mother     Social History History   Social History  . Marital Status: Divorced    Spouse Name: N/A    Number of Children: N/A  . Years of Education: N/A   Occupational History  . Not on file.   Social History Main Topics  . Smoking status: Current Every Day Smoker -- 3.00 packs/day for 35 years    Types: Cigarettes  . Smokeless tobacco: Never Used     Comment: quit for a little while and used a vaporizer but now back to cigarettes b/c vap is broken.  . Alcohol Use: No  . Drug Use: No  . Sexual Activity: Yes   Other Topics Concern  . Not on file   Social History Narrative   Lives in [place] with boyfriend.  Does not routinely exercise.    Review of Systems  General:  No chills, fever, night sweats or weight changes.  Cardiovascular:  +++ chest pain, +++ dyspnea on exertion, no edema, orthopnea, +++ palpitations, no paroxysmal nocturnal dyspnea. Dermatological: No rash, lesions/masses Respiratory: No cough, +++ dyspnea.  Sometimes she feels like she has to yawn often and can't get a good, satisfying breath. Urologic: No hematuria, dysuria Abdominal:   No nausea, vomiting, diarrhea, bright red blood per rectum, melena, or hematemesis Neurologic:  No visual changes, wkns, changes in mental status. All other systems reviewed and are otherwise negative except as noted above.  Physical Exam  Blood pressure 158/84, pulse 101, temperature 98 F (36.7 C), resp. rate 12, weight 190 lb (86.183 kg), SpO2 100.00%.  General: NAD, somewhat agitated. Psych: flat affect, somewhat agitated. Neuro: Alert and oriented X 3. Moves all extremities spontaneously. HEENT: Normal  Neck: Supple without bruits or JVD. Lungs:  Resp regular and unlabored, CTA. Heart:  RRR no s3, s4, or murmurs. Abdomen: Soft,  non-tender, non-distended, BS + x 4.  Extremities: No clubbing, cyanosis or edema. DP/PT/Radials 2+ and equal bilaterally.  Labs  Troponin i, poc: 0.00  Lab Results  Component Value Date   WBC 13.2* 01/13/2014   HGB 13.8 01/13/2014   HCT 38.0 01/13/2014   MCV 88.2 01/13/2014   PLT 278 01/13/2014     Recent Labs Lab 01/13/14 1754  NA 141  K 3.2*  CL 97  CO2 29  BUN 5*  CREATININE 0.65  CALCIUM 9.4  PROT 7.1  BILITOT 0.3  ALKPHOS 81  ALT 9  AST 10  GLUCOSE 163*   Lab Results  Component Value Date   CHOL 139 12/13/2013   HDL 33.60* 12/13/2013   TRIG 280.0* 12/13/2013   Radiology/Studies  Dg Chest 2 View  01/14/2014   CLINICAL DATA:  The mid chest pain, difficulty breathing  EXAM: CHEST  2 VIEW  COMPARISON:  Prior chest x-ray and rib series 227 2014  FINDINGS: Cardiac and mediastinal contours are unchanged and remain within normal limits. Stable mild central bronchitic changes. No pneumothorax, pleural effusion or focal airspace consolidation. Incompletely imaged anterior cervical fusion hardware at C6-C7. No acute osseous abnormality. Mild degenerative spurring in the distal thoracic spine.  IMPRESSION: No active cardiopulmonary disease.   Electronically Signed   By: Jacqulynn Cadet M.D.   On: 01/14/2014 09:24   US Abdomen Complete  01/13/2014   CLINICAL DATA:  Epigastric abdominal pain, history of GERD  EXAM: ULTRASOUND ABDOMEN COMPLETE  IMPRESSION: 1. No explanation for patient's epigastric abdominal pain. Specifically, no evidence of cholelithiasis or urinary obstruction. 2. Grossly unchanged findings suggestive of hepatic steatosis.   Electronically Signed   By: Sandi Mariscal M.D.   On: 01/13/2014 20:45   ECG  Rsr, 99, prolonged QT.  No acute st/t changes.  ASSESSMENT AND PLAN  1.  Chest Pain: pt presented to the ED for the second time in the past 24 hrs with acute on chronic chest pain.  Despite prolonged duration of Ss,  there is no objective evidence of ischemia.  She recently had a negative cardiolite and she does not require any further ischemic testing at this time.  As her cardiolite showed an EF of 47%, we will arrange an outpt echo to re-eval LV fxn.  2.  Palpitations:  She reports two different types of palpitations - one described as her heart going out of rhythm and tachycardia, and the other described as a more fleeting, fluttering sensation and brief lightheadedness.  As above, will check echo as oupt and also place a 48 holter, as Ss occur on a daily basis.  Potassium was low yesterday.  This was not repeated today and patient has already pulled off monitor and is wanting to leave.  We can follow this up when she comes in for echo/monitor and also check a TSH at that time.  3.  DM:  Per PCP.  Cont home meds.  4.  BiPolar D/O and anxiety:  Cont home meds.  5.  HTN:  She is hypertensive in the ED in the setting of relative agitation.  She will need f/u of this as an outpt.  Signed, Murray Hodgkins, NP 01/14/2014, 2:17 PM Patient seen and examined and history reviewed. Agree with above findings and plan. 48 yo female presents with symptoms of refractory atypical chest pain. No history of significant CAD. Prior cath 2008 with nonobstructive disease. Recent myoview showed normal perfusion with EF 47%. Pain is diffuse anterior aching with  focal point of sharp pain beneath left breast. No clear aggravating or improving factors. Is convinced that pain is heart related. Is also convinced that she has some rhythm problem. Monitor review here shows no arrhythmia. Exam of heart is benign. Patient is agitated and argumentative. Ecg is normal. Troponin is normal. Plan on DC home from ED. Will complete cardiac work up with outpatient Echo and 48 hour Holter monitor. Have patient follow up with Dr. Marlou Porch. I think it is unlikely that symptoms are heart related.  Collier Salina [facility] 01/14/2014 4:30 PM

## 2014-01-14 NOTE — ED Notes (Signed)
Cardiologist at bedside.  

## 2014-01-14 NOTE — ED Notes (Signed)
During hourly rounding pt and pt family member stated the were hungry. Asked Benjamine MolaPublishing copy ) if pt could eat. She stated yes I could take them both a [place] sandwich. I retrieved the [place] sandwich and reported back to nurses room and knocked on door to pts room. With no answer I opened the door to find pt undressed from waist up and holding a pillow up towards the door with her head down towards her husbands lap. I quickly set the [place] sandwiches down and the pt laughed at stated that " I had walked in on another blow job. She stated nothing was wrong with her and she was ready to go home." I stated I was sorry and walked out of the room. I immediatly reported this to the nurse Benjamine Mola) and she reported to the attending physician.

## 2014-01-16 NOTE — ED Provider Notes (Signed)
Medical screening examination/treatment/procedure(s) were performed by non-physician practitioner and as supervising physician I was immediately available for consultation/collaboration.  EKG Interpretation    Date/Time:  Tuesday January 14 2014 09:00:58 EST Ventricular Rate:  99 PR Interval:  140 QRS Duration: 84 QT Interval:  404 QTC Calculation: 518 R Axis:   75 Text Interpretation:  Normal sinus rhythm Prolonged QT Abnormal ECG Sinus rhythm Artifact Abnormal ekg Confirmed by Carmin Muskrat  MD ([id number]) on 01/14/2014 9:59:37 AM             Carmin Muskrat, MD 01/16/14 [phone]

## 2014-01-17 ENCOUNTER — Other Ambulatory Visit: Payer: Medicare Other

## 2014-01-23 ENCOUNTER — Other Ambulatory Visit: Payer: Medicare Other

## 2014-01-24 ENCOUNTER — Other Ambulatory Visit (INDEPENDENT_AMBULATORY_CARE_PROVIDER_SITE_OTHER): Payer: Medicare Other

## 2014-01-24 ENCOUNTER — Encounter: Payer: Self-pay | Admitting: *Deleted

## 2014-01-24 ENCOUNTER — Encounter (INDEPENDENT_AMBULATORY_CARE_PROVIDER_SITE_OTHER): Payer: Medicare Other

## 2014-01-24 ENCOUNTER — Ambulatory Visit (INDEPENDENT_AMBULATORY_CARE_PROVIDER_SITE_OTHER): Payer: Medicare Other | Admitting: *Deleted

## 2014-01-24 DIAGNOSIS — R002 Palpitations: Secondary | ICD-10-CM

## 2014-01-24 DIAGNOSIS — IMO0001 Reserved for inherently not codable concepts without codable children: Secondary | ICD-10-CM

## 2014-01-24 DIAGNOSIS — E1165 Type 2 diabetes mellitus with hyperglycemia: Principal | ICD-10-CM

## 2014-01-24 LAB — BASIC METABOLIC PANEL
BUN: 5 mg/dL — ABNORMAL LOW (ref 6–23)
CHLORIDE: 94 meq/L — AB (ref 96–112)
CO2: 29 mEq/L (ref 19–32)
Calcium: 9.3 mg/dL (ref 8.4–10.5)
Creatinine, Ser: 0.7 mg/dL (ref 0.4–1.2)
GFR: 91.63 mL/min (ref >60.00)
Glucose, Bld: 245 mg/dL — ABNORMAL HIGH (ref 70–99)
POTASSIUM: 2.9 meq/L — AB (ref 3.5–5.1)
Sodium: 134 mEq/L — ABNORMAL LOW (ref 135–145)

## 2014-01-24 LAB — HEMOGLOBIN A1C: Hgb A1c MFr Bld: 9.1 % — ABNORMAL HIGH (ref 4.6–6.5)

## 2014-01-24 LAB — TSH: TSH: 0.78 u[IU]/mL (ref 0.35–5.50)

## 2014-01-24 NOTE — Progress Notes (Signed)
Patient ID: [name], female   DOB: 07-01-1966, 48 y.o.   MRN: [id number] E-Cardio 48 hour holter monitor applied to patient.

## 2014-01-27 ENCOUNTER — Ambulatory Visit: Payer: Medicare Other | Admitting: Endocrinology

## 2014-01-27 LAB — COMPREHENSIVE METABOLIC PANEL
ALK PHOS: 74 U/L (ref 39–117)
ALT: 12 U/L (ref 0–35)
AST: 14 U/L (ref 0–37)
Albumin: 3.8 g/dL (ref 3.5–5.2)
BUN: 5 mg/dL — AB (ref 6–23)
CO2: 25 mEq/L (ref 19–32)
Calcium: 9.4 mg/dL (ref 8.4–10.5)
Chloride: 96 mEq/L (ref 96–112)
Creatinine, Ser: 0.8 mg/dL (ref 0.4–1.2)
GFR: 82.32 mL/min (ref >60.00)
Glucose, Bld: 259 mg/dL — ABNORMAL HIGH (ref 70–99)
POTASSIUM: 3.3 meq/L — AB (ref 3.5–5.1)
Sodium: 136 mEq/L (ref 135–145)
Total Bilirubin: 0.3 mg/dL (ref 0.3–1.2)
Total Protein: 7 g/dL (ref 6.0–8.3)

## 2014-01-27 MED ORDER — POTASSIUM CHLORIDE CRYS ER 20 MEQ PO TBCR: EXTENDED_RELEASE_TABLET | ORAL | Status: DC

## 2014-01-29 ENCOUNTER — Ambulatory Visit (HOSPITAL_COMMUNITY): Payer: Medicare Other | Attending: Cardiology | Admitting: Cardiology

## 2014-01-29 DIAGNOSIS — E785 Hyperlipidemia, unspecified: Secondary | ICD-10-CM | POA: Insufficient documentation

## 2014-01-29 DIAGNOSIS — I422 Other hypertrophic cardiomyopathy: Secondary | ICD-10-CM

## 2014-01-29 DIAGNOSIS — R0609 Other forms of dyspnea: Secondary | ICD-10-CM | POA: Insufficient documentation

## 2014-01-29 DIAGNOSIS — I10 Essential (primary) hypertension: Secondary | ICD-10-CM | POA: Insufficient documentation

## 2014-01-29 DIAGNOSIS — R0602 Shortness of breath: Secondary | ICD-10-CM

## 2014-01-29 DIAGNOSIS — R0989 Other specified symptoms and signs involving the circulatory and respiratory systems: Secondary | ICD-10-CM | POA: Insufficient documentation

## 2014-01-29 DIAGNOSIS — E119 Type 2 diabetes mellitus without complications: Secondary | ICD-10-CM | POA: Insufficient documentation

## 2014-01-29 DIAGNOSIS — R079 Chest pain, unspecified: Secondary | ICD-10-CM

## 2014-01-29 DIAGNOSIS — F17200 Nicotine dependence, unspecified, uncomplicated: Secondary | ICD-10-CM | POA: Insufficient documentation

## 2014-01-29 NOTE — Progress Notes (Signed)
Echo performed. 

## 2014-01-30 ENCOUNTER — Other Ambulatory Visit (HOSPITAL_COMMUNITY): Payer: Medicare Other

## 2014-01-31 ENCOUNTER — Encounter: Payer: Self-pay | Admitting: Cardiology

## 2014-01-31 ENCOUNTER — Ambulatory Visit (INDEPENDENT_AMBULATORY_CARE_PROVIDER_SITE_OTHER): Payer: Medicare Other | Admitting: Cardiology

## 2014-01-31 VITALS — BP 159/86 | HR 100 | Ht 65.0 in | Wt 185.0 lb

## 2014-01-31 DIAGNOSIS — I428 Other cardiomyopathies: Secondary | ICD-10-CM

## 2014-01-31 DIAGNOSIS — I420 Dilated cardiomyopathy: Secondary | ICD-10-CM

## 2014-01-31 DIAGNOSIS — Z01812 Encounter for preprocedural laboratory examination: Secondary | ICD-10-CM

## 2014-01-31 DIAGNOSIS — E119 Type 2 diabetes mellitus without complications: Secondary | ICD-10-CM

## 2014-01-31 DIAGNOSIS — R079 Chest pain, unspecified: Secondary | ICD-10-CM

## 2014-01-31 NOTE — Progress Notes (Signed)
Left message advising patient to call the office ,Dr. Marlou Porch needs to see her in the office today.

## 2014-01-31 NOTE — Progress Notes (Signed)
[name]. [address] [place], [place]  [address] Phone: [phone] Fax:  [phone]  Date:  01/31/2014   ID:  [name], [name] December 19, 1966, MRN [id number]  PCP:  Tamsen Roers, MD   History of Present Illness: [name] is a 48 y.o. female previously seen in 2009 here to reestablish care. Has diagnosis of diabetes, hemoglobin A1c 8.8 on 11/14, with neuropathy, hypokalemia for the evaluation of chest pain. At prior catheterization in November of 2008 which showed no angiographically significant coronary artery disease.  Podiatritst had trouble getting pulses in feet. Had chest pain. Tightness. Can be at rest. Comes and goes. Feels short of breath with or without exertion. Unfortunately has not been able to fully quit smoking. Has excruciating back pain. Unable to walk treadmill.  She also has diminished sensation in her legs/feet bilaterally with pain when walking.  Trigs 1500 at one point she states, currently 280.   She states that she has pain sensation in her hands bilaterally, feels as though there is blistering occurring from the inside out.  Echocardiogram demonstrated EF of 30%.  Wt Readings from Last 3 Encounters:  01/31/14 185 lb (83.915 kg)  01/14/14 190 lb (86.183 kg)  01/08/14 193 lb (87.544 kg)     Past Medical History  Diagnosis Date  . Hypertension   . Arthritis   . Sleep apnea     didn't tolerate cpap  . Hyperlipemia   . GERD (gastroesophageal reflux disease)   . Insomnia   . SUI (stress urinary incontinence, female) S/P SLING 12-29-2011  . SOB (shortness of breath) on exertion   . Diabetes mellitus   . Blood transfusion without reported diagnosis   . Bipolar disorder   . Neurogenic bladder   . Hypercholesterolemia   . Gastroparesis   . Ulcer   . MRSA (methicillin resistant Staphylococcus aureus)   . Chronic pain   . Edema   . Chest pain     a. 2008 Cath: normal cors;  b. 12/2013 Lexi CL: EF 47%, no ischemia/infarct.  .  Anxiety   . Vertigo   . DJD (degenerative joint disease)   . COPD (chronic obstructive pulmonary disease)   . Cholecystitis   . Hypersomnia   . Tobacco abuse   . Claudication     a. 12/2013 ABI's: R 0.97, L 0.94.  [name] Palpitations     Past Surgical History  Procedure Laterality Date  . Knee arthroscopy w/ allograft impant      LEFT KNEE, graft x 2  . Foot surgery      bilateral  . Anterior cervical decomp/discectomy fusion  2000    C6 - 7  . Pubovaginal sling  12/29/2011    Procedure: Gaynelle Arabian;  Surgeon: Reece Packer, MD;  Location: [facility];  Service: Urology;  Laterality: N/A;  cysto and sparc sling   . Lumbar fusion    . Total abdominal hysterectomy w/ bilateral salpingoophorectomy  1997  . Multiple laparoscopies for endometriosis    . Cesarean section      X2  . Repeat reconstruction acl left knee/ screws removed  03-28-2000    CADAVER GRAFT  . Reconsturction of congenital uterus anomaly  AGE 24  . Knee surgery      TOTAL 8 SURG'S  . Cardiac catheterization  05-22-2008   DR Jaykwon Morones    NO SIGNIFECANT CAD/ NORMAL LV/  EF 65%/  NO WALL MOTION ABNORMALITIES  . Cystoscopy  with injection  05/04/2012    Procedure: CYSTOSCOPY WITH INJECTION;  Surgeon: Reece Packer, MD;  Location[facility];  Service: Urology;  Laterality: N/A;  MACROPLASTIQUE INJECTION  . Cystoscopy with injection  08/28/2012    Procedure: CYSTOSCOPY WITH INJECTION;  Surgeon: Reece Packer, MD;  Location: [facility];  Service: Urology;  Laterality: N/A;  cysto and macroplastique   . Right carpal tunnel surgery  04-25-2013  . Cysto N/A 04/30/2013    Procedure: CYSTOSCOPY;  Surgeon: Reece Packer, MD;  Location: [facility];  Service: Urology;  Laterality: N/A;  . Pubovaginal sling N/A 04/30/2013    Procedure: REMOVAL OF VAGINAL MESH;  Surgeon: Reece Packer, MD;  Location: [facility];  Service: Urology;  Laterality: N/A;    Current Outpatient  Prescriptions  Medication Sig Dispense Refill  . aspirin 325 MG tablet Take 325 mg by mouth daily as needed for headache.      . chlorproMAZINE (THORAZINE) 100 MG tablet Take 100 mg by mouth 3 (three) times daily.      . clonazePAM (KLONOPIN) 2 MG tablet Take 4 mg by mouth at bedtime. For sleep      . doxepin (SINEQUAN) 75 MG capsule Take 150 mg by mouth at bedtime.      [name] esomeprazole (NEXIUM) 40 MG capsule Take 40 mg by mouth at bedtime.       . insulin aspart (NOVOLOG) 100 UNIT/ML injection To use with V-Go 40 Pump      . Insulin Disposable Pump (V-GO 40) KIT by Does not apply route.      [name] oxycodone (OXY-IR) 5 MG capsule Take 5 mg by mouth every 4 (four) hours as needed for pain.      . potassium chloride SA (K-DUR,KLOR-CON) 20 MEQ tablet 2 tabs BID      . spironolactone-hydrochlorothiazide (ALDACTAZIDE) 25-25 MG per tablet Take 1 tablet by mouth daily.       No current facility-administered medications for this visit.    Allergies:    Allergies  Allergen Reactions  . Latex Itching, Rash and Other (See Comments)    Pt states she cannot use condoms - cause an infection.  Use of latex on skin is okay.  Tape causes rash  . Ibuprofen Other (See Comments)    headaches  . Mirtazapine   . Sweetening Enhancer [Flavoring Agent] Nausea And Vomiting    And headaches  . Tetracyclines & Related   . Trazodone And Nefazodone Other (See Comments)    Hallucinations   . Triazolam Other (See Comments)    hallucinations  . Aspartame And Phenylalanine Nausea And Vomiting    headaches    Social History:  The patient  reports that she has been smoking Cigarettes.  She has a 105 pack-year smoking history. She has never used smokeless tobacco. She reports that she does not drink alcohol or use illicit drugs. Recently started back up.   Family History  Problem Relation Age of Onset  . Hypertension Mother   . Diabetes Mother     ROS:  Please see the history of present illness.   Denies any  fevers, chills, orthopnea, PND, strokelike symptoms, bruising, rashes, bleeding   +Back pain. All other systems reviewed and negative.   PHYSICAL EXAM: VS:  BP 159/86  Pulse 100  Ht '5\' 5"'  (1.651 m)  Wt 185 lb (83.915 kg)  BMI 30.79 kg/m2 Well nourished, well developed, in no acute distress HEENT: normal, [place]/AT, EOMI Neck:  no JVD, normal carotid upstroke, no bruit Cardiac:  normal S1, S2; RRR; no murmur Lungs:  clear to auscultation bilaterally, no wheezing, rhonchi or rales Abd: soft, nontender, no hepatomegaly, no bruits Ext: no edema, diminished distal pulses, diminished sensation in feet bilaterally. I do not see any clear abnormalities with her hands. Skin: warm and dry GU: deferred Neuro: no focal abnormalities noted, AAO x 3  EKG:   sinus rhythm, 90, no other abnormalities Labs: 12/13/13-Potassium 3.2, LDL 81, creatinine 1.0     ASSESSMENT AND PLAN:  1. Cardiomyopathy-newly discovered ejection fraction of 30%. We will proceed with cardiac catheterization to exclude ischemic cardiomyopathy or severe coronary artery disease as cause. We will proceed with radial artery catheterization, morning of February 18 Wednesday. She very clearly stated to me that she wants to be asleep for the procedure, very nervous about this. We will make sure that this is the case. Risks and benefits of procedure including stroke, heart attack, death, renal impairment, bleeding have been explained to the patient. 2. Hypokalemia -- potassium 3.2 on 12/13/13. Potassium sparing diuretic. Per Dr. Vaughan Sine. 3. Chest pain-atypical, EKG reassuring, nuclear stress test on 01/09/14 did not show any evidence of perfusion defects however ejection fraction was reduced, estimated at 47%. Echocardiogram, upon visual, 30%. At prior cardiac catheterization in November of 2008 which showed no angiographically significant coronary artery disease. 4. Diabetes-uncontrolled-strongly encouraged continued compliance. Hemoglobin  A1c 8.811/12/14. 5. Hyperlipidemia-LDL 56, triglycerides 280. Has diabetes becomes better controlled, triglycerides will also be better controlled. Would consider statin therapy if she is able to tolerate given her diabetic status. 6. Claudication/diminished lower extremity pulses - ABI 01/13/14-reassuring. 7. We will fax reports to Ed Blalock, LCSW Fax [phone]. Tel [phone].   Signed, Candee Furbish, MD [facility]  01/31/2014 4:10 PM

## 2014-01-31 NOTE — Patient Instructions (Signed)
Your physician recommends that you continue on your current medications as directed. Please refer to the Current Medication list given to you today.  Your physician has requested that you have a cardiac catheterization. Cardiac catheterization is used to diagnose and/or treat various heart conditions. Doctors may recommend this procedure for a number of different reasons. The most common reason is to evaluate chest pain. Chest pain can be a symptom of coronary artery disease (CAD), and cardiac catheterization can show whether plaque is narrowing or blocking your heart's arteries. This procedure is also used to evaluate the valves, as well as measure the blood flow and oxygen levels in different parts of your heart. For further information please visit [email or web address]. Please follow instruction sheet, as given.  Your physician recommends that you return for lab work in: 1 week, [facility], [place], [place]  Your physician recommends that you schedule a follow-up appointment in: 2 weeks after Cath.

## 2014-01-31 NOTE — Addendum Note (Signed)
Addended by: Jerline Pain on: 01/31/2014 05:46 PM   Modules accepted: Orders

## 2014-02-03 ENCOUNTER — Encounter: Payer: Medicare Other | Admitting: Cardiology

## 2014-02-04 ENCOUNTER — Encounter: Payer: Self-pay | Admitting: Endocrinology

## 2014-02-04 ENCOUNTER — Other Ambulatory Visit: Payer: Self-pay | Admitting: Endocrinology

## 2014-02-04 ENCOUNTER — Ambulatory Visit (INDEPENDENT_AMBULATORY_CARE_PROVIDER_SITE_OTHER): Payer: Medicare Other | Admitting: Endocrinology

## 2014-02-04 ENCOUNTER — Encounter: Payer: Medicare Other | Attending: Endocrinology | Admitting: Nutrition

## 2014-02-04 ENCOUNTER — Telehealth: Payer: Self-pay | Admitting: Cardiology

## 2014-02-04 VITALS — BP 160/90 | HR 92 | Temp 98.3°F | Resp 16 | Ht 64.0 in | Wt 186.4 lb

## 2014-02-04 DIAGNOSIS — I10 Essential (primary) hypertension: Secondary | ICD-10-CM

## 2014-02-04 DIAGNOSIS — G5690 Unspecified mononeuropathy of unspecified upper limb: Secondary | ICD-10-CM | POA: Insufficient documentation

## 2014-02-04 DIAGNOSIS — E1142 Type 2 diabetes mellitus with diabetic polyneuropathy: Secondary | ICD-10-CM

## 2014-02-04 DIAGNOSIS — IMO0001 Reserved for inherently not codable concepts without codable children: Secondary | ICD-10-CM

## 2014-02-04 DIAGNOSIS — E1149 Type 2 diabetes mellitus with other diabetic neurological complication: Secondary | ICD-10-CM

## 2014-02-04 DIAGNOSIS — E1165 Type 2 diabetes mellitus with hyperglycemia: Secondary | ICD-10-CM

## 2014-02-04 DIAGNOSIS — E876 Hypokalemia: Secondary | ICD-10-CM

## 2014-02-04 DIAGNOSIS — Z713 Dietary counseling and surveillance: Secondary | ICD-10-CM | POA: Insufficient documentation

## 2014-02-04 MED ORDER — PROMETHAZINE HCL 25 MG PO TABS: 25.0000 mg | ORAL_TABLET | Freq: Three times a day (TID) | ORAL | Status: DC | PRN

## 2014-02-04 MED ORDER — SPIRONOLACTONE 50 MG PO TABS: 50.0000 mg | ORAL_TABLET | Freq: Every day | ORAL | Status: DC

## 2014-02-04 MED ORDER — RAMIPRIL 5 MG PO CAPS: 5.0000 mg | ORAL_CAPSULE | Freq: Every day | ORAL | Status: DC

## 2014-02-04 MED ORDER — LIDOCAINE 5 % EX OINT: 1.0000 "application " | TOPICAL_OINTMENT | CUTANEOUS | Status: DC | PRN

## 2014-02-04 NOTE — Telephone Encounter (Signed)
New Message  Pt is having a lot of testing coming soon.. They are stating that there maybe issues going back on a stimulant// She is requesting a call back for clearance for the pt to go back on Adderral.. Please call [facility]// Pharmacy Coordinator at [phone]

## 2014-02-04 NOTE — Progress Notes (Signed)
Patient ID: [name], female   DOB: April 15, 1966, 48 y.o.   MRN: [id number]   Reason for Appointment: Diabetes follow-up   History of Present Illness:   Diagnosis: Type 2 diabetes mellitus, date of diagnosis: 2004.  PAST history: She has been treated mostly with insulin since about a year after diagnosis. She has had difficulty with consistent compliance with diet and also compliance with self care including glucose monitoring over the years. She had been mostly treated with basal insulin. Also had been tried on mealtime insulin but she would be noncompliant with this. Was tried on Prandin for mealtime control but difficult to judge efficacy because of lack of postprandial monitoring. Was given Victoza to start in 2011 but did not follow up after this.She was tried on premixed insulin but this did not help her control, mostly because of noncompliance with the doses. She has been using the V- go pump and had better control initially and was better compliant with the daily routine and boluses. She has had frequent education visits also.   RECENT history:   Overall has still continued to be noncompliant with day-to-day care and using her pump Using the 30 unit. pump daily for about 1 week; she is getting some help with a significant other now. Mealtime insulin: 6 clicks whenever she is eating a meal which is at variable times Again she appears to have poor control overall with A1c over 9% Problems identified: 1. Difficult to assess her home control as she does not monitor her blood sugar Regularly and only sporadically at various times. She is now saying that she is having marked sensitivity of her fingertips and did not prick her finger.  2. She had no difficulties with Invokana but is not benefiting from this now with some blood sugars as high as 434  3. She is  probably forgetting to take her boluses when she is eating Especially small meals or snacks . 4. She is  using only the 30 unit pump  instead of the 40 unit that was prescribed 5. She is  sometimes drinking regular soft drinks but also is trying to drink more water recently.   Glucometer: ? [employer] brand.  Blood Glucose readings: am 248; 249, pm 266, hs 434 Hypoglycemia frequency: Never.    Food preferences: eating  1 or 2 meals per day .  Physical activity: exercise: Minimal.  Certified Diabetes Educator visit: Most recent:, 2/14.  The diet that the patient has been following is no real diet And eating at irregular times, sometimes not every day;, no balanced meals.   Wt Readings from Last 3 Encounters:  02/04/14 186 lb 6.4 oz (84.55 kg)  01/31/14 185 lb (83.915 kg)  01/14/14 190 lb (86.183 kg)   DM labs:  Lab Results  Component Value Date   HGBA1C 9.1* 01/24/2014   HGBA1C 8.8* 11/06/2013   HGBA1C 12.0* 07/22/2013   Lab Results  Component Value Date   MICROALBUR 0.2 12/13/2013   CREATININE 0.7 06/01/3015   Complications: are: peripheral neuropathy.  PROBLEM 2: Asking about tenderness of her fingertips, discomfort in her hands.  she was told by the pharmacist that she can try lidocaine jelly for relief She does not want to use Lyrica and does not get enough relief from gabapentin   Labs:  No visits with results within 1 Week(s) from this visit. Latest known visit with results is:  Clinical Support on 01/24/2014  Component Date Value Range Status  . Sodium 01/24/2014 134*  135 - 145 mEq/L Final  . Potassium 01/24/2014 2.9* 3.5 - 5.1 mEq/L Final  . Chloride 01/24/2014 94* 96 - 112 mEq/L Final  . CO2 01/24/2014 29  19 - 32 mEq/L Final  . Glucose, Bld 01/24/2014 245* 70 - 99 mg/dL Final  . BUN 01/24/2014 5* 6 - 23 mg/dL Final  . Creatinine, Ser 01/24/2014 0.7  0.4 - 1.2 mg/dL Final  . Calcium 01/24/2014 9.3  8.4 - 10.5 mg/dL Final  . GFR 01/24/2014 91.63  >60.00 mL/min Final  . TSH 01/24/2014 0.78  0.35 - 5.50 uIU/mL Final      Medication List       This list is accurate as of: 02/04/14  1:38 PM.   Always use your most recent med list.               acetaminophen-codeine 300-30 MG per tablet  Commonly known as:  TYLENOL #3     aspirin 325 MG tablet  Take 325 mg by mouth daily as needed for headache.     chlorproMAZINE 100 MG tablet  Commonly known as:  THORAZINE  Take 100 mg by mouth 3 (three) times daily.     clonazePAM 2 MG tablet  Commonly known as:  KLONOPIN  Take 4 mg by mouth at bedtime. For sleep     esomeprazole 40 MG capsule  Commonly known as:  NEXIUM  Take 40 mg by mouth at bedtime.     insulin aspart 100 UNIT/ML injection  Commonly known as:  novoLOG  To use with V-Go 40 Pump     INVOKANA 100 MG Tabs  Generic drug:  Canagliflozin     oxycodone 5 MG capsule  Commonly known as:  OXY-IR  Take 5 mg by mouth every 4 (four) hours as needed for pain.     potassium chloride SA 20 MEQ tablet  Commonly known as:  K-DUR,KLOR-CON  2 tabs BID     spironolactone-hydrochlorothiazide 25-25 MG per tablet  Commonly known as:  ALDACTAZIDE  Take 1 tablet by mouth daily.     V-GO 40 Kit  by Does not apply route.        Allergies:  Allergies  Allergen Reactions  . Latex Itching, Rash and Other (See Comments)    Pt states she cannot use condoms - cause an infection.  Use of latex on skin is okay.  Tape causes rash  . Ibuprofen Other (See Comments)    headaches  . Mirtazapine   . Sweetening Enhancer [Flavoring Agent] Nausea And Vomiting    And headaches  . Tetracyclines & Related   . Trazodone And Nefazodone Other (See Comments)    Hallucinations   . Triazolam Other (See Comments)    hallucinations  . Aspartame And Phenylalanine Nausea And Vomiting    headaches    Past Medical History  Diagnosis Date  . Hypertension   . Arthritis   . Sleep apnea     didn't tolerate cpap  . Hyperlipemia   . GERD (gastroesophageal reflux disease)   . Insomnia   . SUI (stress urinary incontinence, female) S/P SLING 12-29-2011  . SOB (shortness of breath) on  exertion   . Diabetes mellitus   . Blood transfusion without reported diagnosis   . Bipolar disorder   . Neurogenic bladder   . Hypercholesterolemia   . Gastroparesis   . Ulcer   . MRSA (methicillin resistant Staphylococcus aureus)   . Chronic pain   . Edema   . Chest pain  a. 2008 Cath: normal cors;  b. 12/2013 Lexi CL: EF 47%, no ischemia/infarct.  . Anxiety   . Vertigo   . DJD (degenerative joint disease)   . COPD (chronic obstructive pulmonary disease)   . Cholecystitis   . Hypersomnia   . Tobacco abuse   . Claudication     a. 12/2013 ABI's: R 0.97, L 0.94.  [name] Palpitations     Past Surgical History  Procedure Laterality Date  . Knee arthroscopy w/ allograft impant      LEFT KNEE, graft x 2  . Foot surgery      bilateral  . Anterior cervical decomp/discectomy fusion  2000    C6 - 7  . Pubovaginal sling  12/29/2011    Procedure: Gaynelle Arabian;  Surgeon: Reece Packer, MD;  Location: [facility];  Service: Urology;  Laterality: N/A;  cysto and sparc sling   . Lumbar fusion    . Total abdominal hysterectomy w/ bilateral salpingoophorectomy  1997  . Multiple laparoscopies for endometriosis    . Cesarean section      X2  . Repeat reconstruction acl left knee/ screws removed  03-28-2000    CADAVER GRAFT  . Reconsturction of congenital uterus anomaly  AGE 89  . Knee surgery      TOTAL 8 SURG'S  . Cardiac catheterization  05-22-2008   DR SKAINS    NO SIGNIFECANT CAD/ NORMAL LV/  EF 65%/  NO WALL MOTION ABNORMALITIES  . Cystoscopy with injection  05/04/2012    Procedure: CYSTOSCOPY WITH INJECTION;  Surgeon: Reece Packer, MD;  Location: [facility];  Service: Urology;  Laterality: N/A;  MACROPLASTIQUE INJECTION  . Cystoscopy with injection  08/28/2012    Procedure: CYSTOSCOPY WITH INJECTION;  Surgeon: Reece Packer, MD;  Location: [facility];  Service: Urology;  Laterality: N/A;  cysto and macroplastique   .  Right carpal tunnel surgery  04-25-2013  . Cysto N/A 04/30/2013    Procedure: CYSTOSCOPY;  Surgeon: Reece Packer, MD;  Location: [facility];  Service: Urology;  Laterality: N/A;  . Pubovaginal sling N/A 04/30/2013    Procedure: REMOVAL OF VAGINAL MESH;  Surgeon: Reece Packer, MD;  Location: [facility];  Service: Urology;  Laterality: N/A;    Family History  Problem Relation Age of Onset  . Hypertension Mother   . Diabetes Mother     Social History:  reports that she has been smoking Cigarettes.  She has a 105 pack-year smoking history. She has never used smokeless tobacco. She reports that she does not drink alcohol or use illicit drugs.  Review of Systems -   She has history of high triglycerides, last level was 280 and  was told to start taking fenofibrate  Hypertension:  patient is on Aldactone/HCTZ  from PCP; however her blood pressure is significantly high   HYPOKALEMIA: Her potassium is  again low. She is not having any cramps. Not clear if her PCP has prescribed the Aldactone/HCTZ combination.  No recent edema   She is complaining of nausea recently and asking about the medication for this. No abdominal pain or vomiting, not clear if she has had gastroparesis before    Examination:   BP 160/90  Pulse 92  Temp(Src) 98.3 F (36.8 C)  Resp 16  Ht _0  (1.626 m)  Wt 186 lb 6.4 oz (84.55 kg)  BMI 31.98 kg/m2  SpO2 97%  Body mass index is 31.98 kg/(m^2).   No  skin lesions seen on her fingertips  No ankle edema   Assesment/Plan:   1. Diabetes type 2, uncontrolled - 250.02  The patient's diabetes control is  poor overall and A1c is significantly high Even though she is using the pump more regularly in the last week her readings are mostly over 200 even fasting  See history of present illness for problems identified and details of current management and blood sugars   Changes made today:   Will have her  start using the 40 unit basal pump  She will discuss an  alternative site testing with nurse educator and try to get the brand name meter also  To check blood sugars more consistently before and after meals  Reduce soft drinks  Take extra boluses for high readings rather than just waiting for mealtime coverage   2. Hypokalemia: She will stop HCTZ and use Aldactone alone. Probably will not need potassium supplements. Also start ramipril because of relatively high blood pressure and tendency to hypokalemia   3. Neuropathy: Advised her to take her gabapentin regularly and  she can try lidocaine for her fingertips  4. May try Phenergan for nausea and consider Reglan, however she may have side effects from this   Counseling time over 50% of today's 25 minute visit   Breann Losano 02/04/2014, 1:38 PM

## 2014-02-04 NOTE — Patient Instructions (Signed)
Use Freestyle lancet device of arm areas for blood glucose testing. Use different sites on abdomen and upper buttocks ares for V-Go insertions.   Call if questions.

## 2014-02-04 NOTE — Patient Instructions (Addendum)
Use only the V-go 40 unit pumps  Take extra 1 click for high sugar > 200 and 2 for > 300   Stop the BP med and use new Rxs  Stop Potassium tabs

## 2014-02-04 NOTE — Progress Notes (Signed)
Pt. Has severe neuropathy in her hands causing extreme pain in fingers.  She says that she can not test her blood sugars because of the pain.   She was given a Teacher, music( Lot # [id number], Exp. Date: 09/2014) and a lancet device that will allow her to use her arm.  She re demonstrated this procedure and did it without any difficulty.   We also discussed different sites to use for her V-Go insertions.  She is using only 2 areas about 2 inches away from either side of  her navel.  She agreed to use sites more on the lateral sides of her abdomen and upper buttocks areas.    She had no final questions.

## 2014-02-05 ENCOUNTER — Encounter: Payer: Self-pay | Admitting: Cardiology

## 2014-02-05 ENCOUNTER — Telehealth: Payer: Self-pay | Admitting: *Deleted

## 2014-02-05 ENCOUNTER — Other Ambulatory Visit: Payer: Self-pay | Admitting: *Deleted

## 2014-02-05 ENCOUNTER — Other Ambulatory Visit (INDEPENDENT_AMBULATORY_CARE_PROVIDER_SITE_OTHER): Payer: Medicare Other

## 2014-02-05 ENCOUNTER — Ambulatory Visit (INDEPENDENT_AMBULATORY_CARE_PROVIDER_SITE_OTHER): Payer: Medicare Other | Admitting: Cardiology

## 2014-02-05 VITALS — BP 125/84 | HR 100 | Ht 65.0 in | Wt 185.0 lb

## 2014-02-05 DIAGNOSIS — R079 Chest pain, unspecified: Secondary | ICD-10-CM

## 2014-02-05 DIAGNOSIS — F17200 Nicotine dependence, unspecified, uncomplicated: Secondary | ICD-10-CM

## 2014-02-05 DIAGNOSIS — F411 Generalized anxiety disorder: Secondary | ICD-10-CM

## 2014-02-05 DIAGNOSIS — I428 Other cardiomyopathies: Secondary | ICD-10-CM

## 2014-02-05 DIAGNOSIS — Z01812 Encounter for preprocedural laboratory examination: Secondary | ICD-10-CM

## 2014-02-05 DIAGNOSIS — I420 Dilated cardiomyopathy: Secondary | ICD-10-CM

## 2014-02-05 LAB — BASIC METABOLIC PANEL
BUN: 4 mg/dL — AB (ref 6–23)
CO2: 29 mEq/L (ref 19–32)
CREATININE: 0.6 mg/dL (ref 0.4–1.2)
Calcium: 8.8 mg/dL (ref 8.4–10.5)
Chloride: 101 mEq/L (ref 96–112)
GFR: 106.88 mL/min (ref >60.00)
Glucose, Bld: 165 mg/dL — ABNORMAL HIGH (ref 70–99)
Potassium: 3.4 mEq/L — ABNORMAL LOW (ref 3.5–5.1)
Sodium: 139 mEq/L (ref 135–145)

## 2014-02-05 LAB — CBC WITH DIFFERENTIAL/PLATELET
BASOS PCT: 0.3 % (ref 0.0–3.0)
Basophils Absolute: 0.1 10*3/uL (ref 0.0–0.1)
EOS ABS: 0.1 10*3/uL (ref 0.0–0.7)
EOS PCT: 0.5 % (ref 0.0–5.0)
HCT: 40.4 % (ref 36.0–46.0)
HEMOGLOBIN: 13.5 g/dL (ref 12.0–15.0)
LYMPHS PCT: 24.7 % (ref 12.0–46.0)
Lymphs Abs: 5.3 10*3/uL — ABNORMAL HIGH (ref 0.7–4.0)
MCHC: 33.4 g/dL (ref 30.0–36.0)
MCV: 93.9 fl (ref 78.0–100.0)
Monocytes Absolute: 0.5 10*3/uL (ref 0.1–1.0)
Monocytes Relative: 2.5 % — ABNORMAL LOW (ref 3.0–12.0)
NEUTROS ABS: 15.4 10*3/uL — AB (ref 1.4–7.7)
Neutrophils Relative %: 72 % (ref 43.0–77.0)
Platelets: 230 10*3/uL (ref 150.0–400.0)
RBC: 4.3 Mil/uL (ref 3.87–5.11)
RDW: 13.5 % (ref 11.5–14.6)
WBC: 21.4 10*3/uL — AB (ref 4.5–10.5)

## 2014-02-05 LAB — PROTIME-INR
INR: 1 ratio (ref 0.8–1.0)
Prothrombin Time: 10.2 s (ref 10.2–12.4)

## 2014-02-05 MED ORDER — GLUCOSE BLOOD VI STRP: ORAL_STRIP | Status: DC

## 2014-02-05 NOTE — Telephone Encounter (Signed)
Critical lab called - WBC 21.4 - notified Dr. Marlou Porch. Dr. Marlou Porch had just seen patient in [place].  Dr. Marlou Porch to notify PCP/Internal Medicine MD.

## 2014-02-05 NOTE — Progress Notes (Addendum)
[facility]. [address], [place]  [address] Phone: [phone] Fax:  [phone]  Date:  02/05/2014   ID:  [name], [name] [birth year], MRN [id number]  PCP:  Tamsen Roers, MD   History of Present Illness: [name] is a 48 y.o. female previously seen in 2009 here to evaluate chest pain. Has diagnosis of diabetes, hemoglobin A1c 8.8 on 11/14, with neuropathy, hypokalemia for the evaluation of chest pain. At prior catheterization in November of 2008 which showed no angiographically significant coronary artery disease.  Podiatritst had trouble getting pulses in feet. Had chest pain. Tightness. Can be at rest. Comes and goes. Feels short of breath with or without exertion. Unfortunately has not been able to fully quit smoking. Has excruciating back pain. Unable to walk treadmill.  She also has diminished sensation in her legs/feet bilaterally with pain when walking.  Trigs 1500 at one point she states, currently 280.   She states that she has pain sensation in her hands bilaterally, feels as though there is blistering occurring from the inside out.  Echocardiogram demonstrated EF of 30%.  02/05/14 Both she and her husband are vigorous smokers. She came in today for blood pressure check and complaint of chest discomfort. I personally examined her. Her chest pain has been constant, somewhat positional. She points to the exact area where the pain is occurring, approximately quarter size left of sternum. High anxiety at baseline. Aspirin was given.   EKG performed shows no evidence of ST segment changes.  Wt Readings from Last 3 Encounters:  02/04/14 186 lb 6.4 oz (84.55 kg)  01/31/14 185 lb (83.915 kg)  01/14/14 190 lb (86.183 kg)     Past Medical History  Diagnosis Date  . Hypertension   . Arthritis   . Sleep apnea     didn't tolerate cpap  . Hyperlipemia   . GERD (gastroesophageal reflux disease)   . Insomnia   . SUI (stress urinary incontinence,  female) S/P SLING 12-29-2011  . SOB (shortness of breath) on exertion   . Diabetes mellitus   . Blood transfusion without reported diagnosis   . Bipolar disorder   . Neurogenic bladder   . Hypercholesterolemia   . Gastroparesis   . Ulcer   . MRSA (methicillin resistant Staphylococcus aureus)   . Chronic pain   . Edema   . Chest pain     a. 2008 Cath: normal cors;  b. 12/2013 Lexi CL: EF 47%, no ischemia/infarct.  . Anxiety   . Vertigo   . DJD (degenerative joint disease)   . COPD (chronic obstructive pulmonary disease)   . Cholecystitis   . Hypersomnia   . Tobacco abuse   . Claudication     a. 12/2013 ABI's: R 0.97, L 0.94.  [name] Palpitations     Past Surgical History  Procedure Laterality Date  . Knee arthroscopy w/ allograft impant      LEFT KNEE, graft x 2  . Foot surgery      bilateral  . Anterior cervical decomp/discectomy fusion  2000    C6 - 7  . Pubovaginal sling  12/29/2011    Procedure: [name];  Surgeon: Reece Packer, MD;  Location: [facility];  Service: Urology;  Laterality: N/A;  cysto and sparc sling   . Lumbar fusion    . Total abdominal hysterectomy w/ bilateral salpingoophorectomy  1997  . Multiple laparoscopies for endometriosis    . Cesarean  section      X2  . Repeat reconstruction acl left knee/ screws removed  03-28-2000    CADAVER GRAFT  . 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 Knee surgery      TOTAL 8 SURG'S  . Cardiac catheterization  05-22-2008   DR SKAINS    NO SIGNIFECANT CAD/ NORMAL LV/  EF 65%/  NO WALL MOTION ABNORMALITIES  . Cystoscopy with injection  05/04/2012    Procedure: CYSTOSCOPY WITH INJECTION;  Surgeon: Reece Packer, MD;  [facility];  Service: Urology;  Laterality: N/A;  MACROPLASTIQUE INJECTION  . Cystoscopy with injection  08/28/2012    Procedure: CYSTOSCOPY WITH INJECTION;  Surgeon: Reece Packer, MD;  Location[facility];   Service: Urology;  Laterality: N/A;  cysto and macroplastique   . Right carpal tunnel surgery  04-25-2013  . Cysto N/A 04/30/2013    Procedure: CYSTOSCOPY;  Surgeon: Reece Packer, MD;  Location: [facility];  Service: Urology;  Laterality: N/A;  . Pubovaginal sling N/A 04/30/2013    Procedure: REMOVAL OF VAGINAL MESH;  Surgeon: Reece Packer, MD;  Location: [facility];  Service: Urology;  Laterality: N/A;    Current Outpatient Prescriptions  Medication Sig Dispense Refill  . acetaminophen-codeine (TYLENOL #3) 300-30 MG per tablet       . aspirin 325 MG tablet Take 325 mg by mouth daily as needed for headache.      . chlorproMAZINE (THORAZINE) 100 MG tablet Take 100 mg by mouth 3 (three) times daily.      . clonazePAM (KLONOPIN) 2 MG tablet Take 4 mg by mouth at bedtime. For sleep      . esomeprazole (NEXIUM) 40 MG capsule Take 40 mg by mouth at bedtime.       [name] glucose blood (FREESTYLE LITE) test strip Use as instructed to check blood sugars 4 times per day  132 each  5  . insulin aspart (NOVOLOG) 100 UNIT/ML injection To use with V-Go 40 Pump      . Insulin Disposable Pump (V-GO 40) KIT by Does not apply route.      . INVOKANA 100 MG TABS       . lidocaine (XYLOCAINE) 5 % ointment Apply 1 application topically as needed.  35.44 g  0  . NOVOLOG 100 UNIT/ML injection TO USE WITH V-GO PUMP  30 mL  1  . oxycodone (OXY-IR) 5 MG capsule Take 5 mg by mouth every 4 (four) hours as needed for pain.      . potassium chloride SA (K-DUR,KLOR-CON) 20 MEQ tablet 2 tabs BID      . promethazine (PHENERGAN) 25 MG tablet Take 1 tablet (25 mg total) by mouth every 8 (eight) hours as needed for nausea or vomiting.  15 tablet  0  . ramipril (ALTACE) 5 MG capsule Take 1 capsule (5 mg total) by mouth daily.  90 capsule  3  . spironolactone (ALDACTONE) 50 MG tablet Take 1 tablet (50 mg total) by mouth daily.  30 tablet  3   No current facility-administered medications for this visit.    Allergies:    Allergies    Allergen Reactions  . Latex Itching, Rash and Other (See Comments)    Pt states she cannot use condoms - cause an infection.  Use of latex on skin is okay.  Tape causes rash  . Ibuprofen Other (See Comments)    headaches  . Mirtazapine   .  Sweetening Enhancer [Flavoring Agent] Nausea And Vomiting    And headaches  . Tetracyclines & Related   . Trazodone And Nefazodone Other (See Comments)    Hallucinations   . Triazolam Other (See Comments)    hallucinations  . Aspartame And Phenylalanine Nausea And Vomiting    headaches    Social History:  The patient  reports that she has been smoking Cigarettes.  She has a 105 pack-year smoking history. She has never used smokeless tobacco. She reports that she does not drink alcohol or use illicit drugs. Recently started back up.   Family History  Problem Relation Age of Onset  . Hypertension Mother   . Diabetes Mother     ROS:  Please see the history of present illness.   Denies any fevers, chills, orthopnea, PND, strokelike symptoms, bruising, rashes, bleeding   +Back pain, chronic. All other systems reviewed and negative.   PHYSICAL EXAM: VS:  There were no vitals taken for this visit.  Well nourished, well developed, anxious at baseline HEENT: normal, [place]/AT, EOMI Neck: no JVD, normal carotid upstroke, no bruit Cardiac:  normal S1, S2; RRR; no murmurPinpoint chest soreness left of sternum. Lungs:  clear to auscultation bilaterally, no wheezing, rhonchi or rales Abd: soft, nontender, no hepatomegaly, no bruits Ext: no edema, diminished distal pulses Skin: warm and dry GU: deferred Neuro: no focal abnormalities noted, AAO x 3  EKG:   sinus rhythm, 90, no other abnormalities Labs: 12/13/13-Potassium 3.2, LDL 81, creatinine 1.0     ASSESSMENT AND PLAN:  1. Chest pain-she is quite anxious, this is her baseline. At prior visit when discussing cardiac catheterization she was very concerned/nervous. Her chest pain is pinpoint in  character. Atypical. EKG reassuring. Aspirin given. Reassurance. She will have cardiac catheterization soon. If symptoms become more worrisome she may proceed to emergency room.  2. Cardiomyopathy-newly discovered ejection fraction of 30%. We will proceed with cardiac catheterization to exclude ischemic cardiomyopathy or severe coronary artery disease as cause. We will proceed with radial artery catheterization, morning of February 18 Wednesday. She very clearly stated to me that she wants to be asleep for the procedure, very nervous about this. We will make sure that this is the case. Risks and benefits of procedure including stroke, heart attack, death, renal impairment, bleeding have been explained to the patient. 3. Hypokalemia -- potassium 3.2 on 12/13/13. Potassium sparing diuretic. Per Dr. Vaughan Sine. 4. Chest pain-atypical, EKG reassuring, nuclear stress test on 01/09/14 did not show any evidence of perfusion defects however ejection fraction was reduced, estimated at 47%. Echocardiogram, upon visual, 30%. At prior cardiac catheterization in November of 2008 which showed no angiographically significant coronary artery disease. 5. Diabetes-uncontrolled-strongly encouraged continued compliance. Hemoglobin A1c 8.811/12/14. 6. Hyperlipidemia-LDL 56, triglycerides 280. Has diabetes becomes better controlled, triglycerides will also be better controlled. Would consider statin therapy if she is able to tolerate given her diabetic status. 7. Claudication/diminished lower extremity pulses - ABI 01/13/14-reassuring. 8. We will fax reports to Ed Blalock, LCSW Fax [phone]. Tel [phone].   Signed, Candee Furbish, MD [facility]  02/05/2014 4:15 PM    Addendum: Precatheterization lab work came back showing white blood cell count of 20. In previous review of records, she has been 22, 10. She denied any recent fevers, worsening cough. I reviewed her medications and I do not see any recent prednisone. I placed a  call to her and left a detailed voice message. I would like for Dr. Dwyane Dee to evaluate her  as well for signs of any infection. It appears that she has not seen Dr. Rex Kras for several years. I also told her that if symptoms were to arise or become more worrisome to report to urgent care or emergency department.

## 2014-02-06 NOTE — Telephone Encounter (Signed)
Please review and advise.

## 2014-02-06 NOTE — Telephone Encounter (Signed)
Let's hold off on Adderal for now. Continue with cardiac work up. Also, please check in with her to make sure she gets in with her PCP to evaluate WBC of 21, today!. Need to look for signs of infection. Also please have her repeat CBC one day prior to Cath.

## 2014-02-07 ENCOUNTER — Other Ambulatory Visit: Payer: Self-pay | Admitting: Neurosurgery

## 2014-02-07 ENCOUNTER — Encounter (HOSPITAL_COMMUNITY): Payer: Self-pay | Admitting: Pharmacy Technician

## 2014-02-07 ENCOUNTER — Telehealth: Payer: Self-pay | Admitting: *Deleted

## 2014-02-07 DIAGNOSIS — M545 Low back pain, unspecified: Secondary | ICD-10-CM

## 2014-02-07 DIAGNOSIS — M542 Cervicalgia: Secondary | ICD-10-CM

## 2014-02-07 NOTE — Telephone Encounter (Signed)
Please disregard, I just saw your staff message about her WBC, she was instructed to call her PCP

## 2014-02-07 NOTE — Telephone Encounter (Signed)
Patient called about her upcoming heart cath, her WBC was elevated to 20, they told her call you to see if you wanted to do anything about it? Please advise

## 2014-02-10 ENCOUNTER — Telehealth: Payer: Self-pay | Admitting: Cardiology

## 2014-02-10 ENCOUNTER — Ambulatory Visit (INDEPENDENT_AMBULATORY_CARE_PROVIDER_SITE_OTHER): Payer: Medicare Other | Admitting: *Deleted

## 2014-02-10 ENCOUNTER — Encounter (INDEPENDENT_AMBULATORY_CARE_PROVIDER_SITE_OTHER): Payer: Self-pay

## 2014-02-10 DIAGNOSIS — I10 Essential (primary) hypertension: Secondary | ICD-10-CM

## 2014-02-10 DIAGNOSIS — E785 Hyperlipidemia, unspecified: Secondary | ICD-10-CM

## 2014-02-10 DIAGNOSIS — E7800 Pure hypercholesterolemia, unspecified: Secondary | ICD-10-CM

## 2014-02-10 LAB — CBC WITH DIFFERENTIAL/PLATELET
BASOS PCT: 0.4 % (ref 0.0–3.0)
Basophils Absolute: 0.1 10*3/uL (ref 0.0–0.1)
EOS PCT: 0.8 % (ref 0.0–5.0)
Eosinophils Absolute: 0.1 10*3/uL (ref 0.0–0.7)
HCT: 38.3 % (ref 36.0–46.0)
Hemoglobin: 12.6 g/dL (ref 12.0–15.0)
LYMPHS PCT: 22.1 % (ref 12.0–46.0)
Lymphs Abs: 2.9 10*3/uL (ref 0.7–4.0)
MCHC: 32.9 g/dL (ref 30.0–36.0)
MCV: 94.4 fl (ref 78.0–100.0)
MONOS PCT: 3.1 % (ref 3.0–12.0)
Monocytes Absolute: 0.4 10*3/uL (ref 0.1–1.0)
Neutro Abs: 9.6 10*3/uL — ABNORMAL HIGH (ref 1.4–7.7)
Neutrophils Relative %: 73.6 % (ref 43.0–77.0)
PLATELETS: 211 10*3/uL (ref 150.0–400.0)
RBC: 4.06 Mil/uL (ref 3.87–5.11)
RDW: 13.3 % (ref 11.5–14.6)
WBC: 13.1 10*3/uL — AB (ref 4.5–10.5)

## 2014-02-10 NOTE — Telephone Encounter (Signed)
  Patient is aware, Patient will have labs today, Called PCP and scheduled appt.for patient due to WBC for 21

## 2014-02-10 NOTE — Telephone Encounter (Signed)
Lab appointment scheduled,and hold medication for surgery, Scheduled appointment with PCP.

## 2014-02-12 ENCOUNTER — Inpatient Hospital Stay (HOSPITAL_COMMUNITY)
Admission: RE | Admit: 2014-02-12 | Discharge: 2014-02-14 | DRG: 286 | Disposition: A | Discharge status: Home / Self Care | Payer: Medicare Other | Source: Ambulatory Visit | Attending: Internal Medicine | Admitting: Internal Medicine

## 2014-02-12 ENCOUNTER — Inpatient Hospital Stay (HOSPITAL_COMMUNITY): Payer: Medicare Other

## 2014-02-12 ENCOUNTER — Ambulatory Visit (HOSPITAL_COMMUNITY): Payer: Medicare Other

## 2014-02-12 ENCOUNTER — Encounter (HOSPITAL_COMMUNITY)
Admission: RE | Disposition: A | Discharge status: Home / Self Care | Payer: Self-pay | Source: Ambulatory Visit | Attending: Internal Medicine

## 2014-02-12 ENCOUNTER — Encounter (HOSPITAL_COMMUNITY): Payer: Self-pay | Admitting: Internal Medicine

## 2014-02-12 DIAGNOSIS — G8929 Other chronic pain: Secondary | ICD-10-CM | POA: Diagnosis present

## 2014-02-12 DIAGNOSIS — E119 Type 2 diabetes mellitus without complications: Secondary | ICD-10-CM

## 2014-02-12 DIAGNOSIS — I5022 Chronic systolic (congestive) heart failure: Secondary | ICD-10-CM | POA: Diagnosis present

## 2014-02-12 DIAGNOSIS — F1010 Alcohol abuse, uncomplicated: Secondary | ICD-10-CM | POA: Diagnosis present

## 2014-02-12 DIAGNOSIS — I6340 Cerebral infarction due to embolism of unspecified cerebral artery: Secondary | ICD-10-CM | POA: Diagnosis not present

## 2014-02-12 DIAGNOSIS — IMO0002 Reserved for concepts with insufficient information to code with codable children: Secondary | ICD-10-CM | POA: Diagnosis not present

## 2014-02-12 DIAGNOSIS — I6350 Cerebral infarction due to unspecified occlusion or stenosis of unspecified cerebral artery: Secondary | ICD-10-CM

## 2014-02-12 DIAGNOSIS — H5120 Internuclear ophthalmoplegia, unspecified eye: Secondary | ICD-10-CM | POA: Diagnosis present

## 2014-02-12 DIAGNOSIS — K219 Gastro-esophageal reflux disease without esophagitis: Secondary | ICD-10-CM | POA: Diagnosis present

## 2014-02-12 DIAGNOSIS — I420 Dilated cardiomyopathy: Secondary | ICD-10-CM

## 2014-02-12 DIAGNOSIS — F319 Bipolar disorder, unspecified: Secondary | ICD-10-CM | POA: Diagnosis present

## 2014-02-12 DIAGNOSIS — I739 Peripheral vascular disease, unspecified: Secondary | ICD-10-CM | POA: Diagnosis present

## 2014-02-12 DIAGNOSIS — E1140 Type 2 diabetes mellitus with diabetic neuropathy, unspecified: Secondary | ICD-10-CM | POA: Diagnosis present

## 2014-02-12 DIAGNOSIS — H532 Diplopia: Secondary | ICD-10-CM | POA: Diagnosis not present

## 2014-02-12 DIAGNOSIS — E1165 Type 2 diabetes mellitus with hyperglycemia: Secondary | ICD-10-CM

## 2014-02-12 DIAGNOSIS — I499 Cardiac arrhythmia, unspecified: Secondary | ICD-10-CM

## 2014-02-12 DIAGNOSIS — E1142 Type 2 diabetes mellitus with diabetic polyneuropathy: Secondary | ICD-10-CM | POA: Diagnosis present

## 2014-02-12 DIAGNOSIS — Z794 Long term (current) use of insulin: Secondary | ICD-10-CM

## 2014-02-12 DIAGNOSIS — Z01812 Encounter for preprocedural laboratory examination: Secondary | ICD-10-CM

## 2014-02-12 DIAGNOSIS — I10 Essential (primary) hypertension: Secondary | ICD-10-CM

## 2014-02-12 DIAGNOSIS — E1149 Type 2 diabetes mellitus with other diabetic neurological complication: Secondary | ICD-10-CM

## 2014-02-12 DIAGNOSIS — Y849 Medical procedure, unspecified as the cause of abnormal reaction of the patient, or of later complication, without mention of misadventure at the time of the procedure: Secondary | ICD-10-CM | POA: Diagnosis not present

## 2014-02-12 DIAGNOSIS — E876 Hypokalemia: Secondary | ICD-10-CM | POA: Diagnosis present

## 2014-02-12 DIAGNOSIS — I428 Other cardiomyopathies: Secondary | ICD-10-CM | POA: Diagnosis present

## 2014-02-12 DIAGNOSIS — E785 Hyperlipidemia, unspecified: Secondary | ICD-10-CM | POA: Diagnosis present

## 2014-02-12 DIAGNOSIS — R0789 Other chest pain: Principal | ICD-10-CM | POA: Diagnosis present

## 2014-02-12 DIAGNOSIS — Z9641 Presence of insulin pump (external) (internal): Secondary | ICD-10-CM

## 2014-02-12 DIAGNOSIS — F17200 Nicotine dependence, unspecified, uncomplicated: Secondary | ICD-10-CM | POA: Diagnosis present

## 2014-02-12 DIAGNOSIS — Y9210 Unspecified residential institution as the place of occurrence of the external cause: Secondary | ICD-10-CM | POA: Diagnosis not present

## 2014-02-12 DIAGNOSIS — IMO0001 Reserved for inherently not codable concepts without codable children: Secondary | ICD-10-CM

## 2014-02-12 DIAGNOSIS — E7800 Pure hypercholesterolemia, unspecified: Secondary | ICD-10-CM

## 2014-02-12 DIAGNOSIS — E781 Pure hyperglyceridemia: Secondary | ICD-10-CM | POA: Diagnosis present

## 2014-02-12 DIAGNOSIS — G4700 Insomnia, unspecified: Secondary | ICD-10-CM | POA: Diagnosis present

## 2014-02-12 DIAGNOSIS — R079 Chest pain, unspecified: Secondary | ICD-10-CM

## 2014-02-12 DIAGNOSIS — R9439 Abnormal result of other cardiovascular function study: Secondary | ICD-10-CM

## 2014-02-12 DIAGNOSIS — I639 Cerebral infarction, unspecified: Secondary | ICD-10-CM | POA: Diagnosis present

## 2014-02-12 DIAGNOSIS — Z8614 Personal history of Methicillin resistant Staphylococcus aureus infection: Secondary | ICD-10-CM

## 2014-02-12 DIAGNOSIS — Z23 Encounter for immunization: Secondary | ICD-10-CM

## 2014-02-12 DIAGNOSIS — J449 Chronic obstructive pulmonary disease, unspecified: Secondary | ICD-10-CM | POA: Diagnosis present

## 2014-02-12 DIAGNOSIS — J4489 Other specified chronic obstructive pulmonary disease: Secondary | ICD-10-CM | POA: Diagnosis present

## 2014-02-12 HISTORY — PX: LEFT HEART CATHETERIZATION WITH CORONARY ANGIOGRAM: SHX5451

## 2014-02-12 HISTORY — DX: Dorsalgia, unspecified: M54.9

## 2014-02-12 HISTORY — DX: Cerebral infarction, unspecified: I63.9

## 2014-02-12 HISTORY — DX: Other chronic pain: G89.29

## 2014-02-12 HISTORY — DX: Headache: R51

## 2014-02-12 HISTORY — DX: Personality disorder, unspecified: F60.9

## 2014-02-12 LAB — COMPREHENSIVE METABOLIC PANEL
ALBUMIN: 2.7 g/dL — AB (ref 3.5–5.2)
ALT: 10 U/L (ref 0–35)
AST: 12 U/L (ref 0–37)
Alkaline Phosphatase: 68 U/L (ref 39–117)
BUN: 6 mg/dL (ref 6–23)
CHLORIDE: 104 meq/L (ref 96–112)
CO2: 27 mEq/L (ref 19–32)
Calcium: 8.7 mg/dL (ref 8.4–10.5)
Creatinine, Ser: 0.63 mg/dL (ref 0.50–1.10)
GFR calc Af Amer: >90 mL/min (ref >90)
GFR calc non Af Amer: >90 mL/min (ref >90)
Glucose, Bld: 153 mg/dL — ABNORMAL HIGH (ref 70–99)
POTASSIUM: 3.9 meq/L (ref 3.7–5.3)
Sodium: 142 mEq/L (ref 137–147)
TOTAL PROTEIN: 5.8 g/dL — AB (ref 6.0–8.3)

## 2014-02-12 LAB — GLUCOSE, CAPILLARY
GLUCOSE-CAPILLARY: 55 mg/dL — AB (ref 70–99)
GLUCOSE-CAPILLARY: 67 mg/dL — AB (ref 70–99)
GLUCOSE-CAPILLARY: 178 mg/dL — AB (ref 70–99)
Glucose-Capillary: 77 mg/dL (ref 70–99)
Glucose-Capillary: 98 mg/dL (ref 70–99)
Glucose-Capillary: 139 mg/dL — ABNORMAL HIGH (ref 70–99)
Glucose-Capillary: 146 mg/dL — ABNORMAL HIGH (ref 70–99)
Glucose-Capillary: 211 mg/dL — ABNORMAL HIGH (ref 70–99)

## 2014-02-12 LAB — CBC
HCT: 34.7 % — ABNORMAL LOW (ref 36.0–46.0)
Hemoglobin: 12.1 g/dL (ref 12.0–15.0)
MCH: 31.7 pg (ref 26.0–34.0)
MCHC: 34.9 g/dL (ref 30.0–36.0)
MCV: 90.8 fL (ref 78.0–100.0)
PLATELETS: 216 10*3/uL (ref 150–400)
RBC: 3.82 MIL/uL — ABNORMAL LOW (ref 3.87–5.11)
RDW: 13 % (ref 11.5–15.5)
WBC: 8.9 10*3/uL (ref 4.0–10.5)

## 2014-02-12 LAB — ETHANOL

## 2014-02-12 LAB — MRSA PCR SCREENING: MRSA by PCR: NEGATIVE

## 2014-02-12 LAB — PROTIME-INR
INR: 0.95 (ref 0.00–1.49)
PROTHROMBIN TIME: 12.5 s (ref 11.6–15.2)

## 2014-02-12 LAB — APTT: APTT: 28 s (ref 24–37)

## 2014-02-12 LAB — POTASSIUM: POTASSIUM: 3 meq/L — AB (ref 3.7–5.3)

## 2014-02-12 SURGERY — LEFT HEART CATHETERIZATION WITH CORONARY ANGIOGRAM
Anesthesia: LOCAL

## 2014-02-12 MED ORDER — DIAZEPAM 5 MG PO TABS: 10.0000 mg | ORAL_TABLET | Freq: Every day | ORAL | Status: DC

## 2014-02-12 MED ORDER — FENTANYL CITRATE 0.05 MG/ML IJ SOLN
INTRAMUSCULAR | Status: AC
  Filled 2014-02-12: qty 2

## 2014-02-12 MED ORDER — INSULIN ASPART 100 UNIT/ML ~~LOC~~ SOLN: 2.0000 [IU] | SUBCUTANEOUS | Status: DC

## 2014-02-12 MED ORDER — INSULIN ASPART 100 UNIT/ML ~~LOC~~ SOLN: 1.6700 [IU] | SUBCUTANEOUS | Status: DC

## 2014-02-12 MED ORDER — SODIUM CHLORIDE 0.9 % IJ SOLN: 3.0000 mL | Freq: Two times a day (BID) | INTRAMUSCULAR | Status: DC

## 2014-02-12 MED ORDER — CHLORPROMAZINE HCL 100 MG PO TABS
100.0000 mg | ORAL_TABLET | Freq: Every day | ORAL | Status: DC
  Administered 2014-02-12 – 2014-02-13 (×2): 100 mg via ORAL
  Filled 2014-02-12 (×3): qty 1

## 2014-02-12 MED ORDER — DEXTROSE 50 % IV SOLN
25.0000 mL | Freq: Once | INTRAVENOUS | Status: AC
  Administered 2014-02-12: 25 mL via INTRAVENOUS

## 2014-02-12 MED ORDER — OXYCODONE HCL 5 MG PO TABS: 5.0000 mg | ORAL_TABLET | ORAL | Status: DC | PRN

## 2014-02-12 MED ORDER — PNEUMOCOCCAL VAC POLYVALENT 25 MCG/0.5ML IJ INJ
0.5000 mL | INJECTION | INTRAMUSCULAR | Status: AC
  Administered 2014-02-13: 0.5 mL via INTRAMUSCULAR
  Filled 2014-02-12: qty 0.5

## 2014-02-12 MED ORDER — HEPARIN (PORCINE) IN NACL 2-0.9 UNIT/ML-% IJ SOLN
INTRAMUSCULAR | Status: AC
  Filled 2014-02-12: qty 1000

## 2014-02-12 MED ORDER — SODIUM CHLORIDE 0.9 % IV SOLN: 250.0000 mL | INTRAVENOUS | Status: DC | PRN

## 2014-02-12 MED ORDER — AMITRIPTYLINE HCL 75 MG PO TABS
150.0000 mg | ORAL_TABLET | Freq: Every day | ORAL | Status: DC
  Administered 2014-02-12 – 2014-02-13 (×2): 150 mg via ORAL
  Filled 2014-02-12 (×3): qty 2

## 2014-02-12 MED ORDER — SODIUM CHLORIDE 0.9 % IV SOLN
1.0000 mL/kg/h | INTRAVENOUS | Status: DC
  Administered 2014-02-12: 1 mL/kg/h via INTRAVENOUS

## 2014-02-12 MED ORDER — RAMIPRIL 5 MG PO CAPS
5.0000 mg | ORAL_CAPSULE | Freq: Every day | ORAL | Status: DC
  Administered 2014-02-12 – 2014-02-14 (×3): 5 mg via ORAL
  Filled 2014-02-12 (×4): qty 1

## 2014-02-12 MED ORDER — MIDAZOLAM HCL 2 MG/2ML IJ SOLN
INTRAMUSCULAR | Status: AC
  Filled 2014-02-12: qty 2

## 2014-02-12 MED ORDER — POTASSIUM CHLORIDE CRYS ER 20 MEQ PO TBCR
40.0000 meq | EXTENDED_RELEASE_TABLET | Freq: Once | ORAL | Status: AC
  Administered 2014-02-12: 40 meq via ORAL
  Filled 2014-02-12: qty 2

## 2014-02-12 MED ORDER — ACETAMINOPHEN-CODEINE #3 300-30 MG PO TABS
1.0000 | ORAL_TABLET | Freq: Four times a day (QID) | ORAL | Status: DC | PRN
  Administered 2014-02-12: 1 via ORAL
  Filled 2014-02-12: qty 1

## 2014-02-12 MED ORDER — DIAZEPAM 5 MG PO TABS
5.0000 mg | ORAL_TABLET | ORAL | Status: AC
  Administered 2014-02-12: 5 mg via ORAL
  Filled 2014-02-12: qty 1

## 2014-02-12 MED ORDER — INSULIN PUMP
Freq: Three times a day (TID) | SUBCUTANEOUS | Status: DC
  Administered 2014-02-12 – 2014-02-14 (×4): via SUBCUTANEOUS
  Filled 2014-02-12: qty 1

## 2014-02-12 MED ORDER — ASPIRIN 325 MG PO TABS
325.0000 mg | ORAL_TABLET | Freq: Every day | ORAL | Status: DC | PRN
  Filled 2014-02-12: qty 1

## 2014-02-12 MED ORDER — ASPIRIN 81 MG PO CHEW
81.0000 mg | CHEWABLE_TABLET | ORAL | Status: AC
  Administered 2014-02-12: 81 mg via ORAL
  Filled 2014-02-12: qty 1

## 2014-02-12 MED ORDER — CANAGLIFLOZIN 100 MG PO TABS
100.0000 mg | ORAL_TABLET | Freq: Every day | ORAL | Status: DC
  Administered 2014-02-12 – 2014-02-14 (×3): 100 mg via ORAL

## 2014-02-12 MED ORDER — OXYCODONE HCL 5 MG PO CAPS: 5.0000 mg | ORAL_CAPSULE | ORAL | Status: DC | PRN

## 2014-02-12 MED ORDER — AMITRIPTYLINE HCL 75 MG PO TABS: 150.0000 mg | ORAL_TABLET | Freq: Every day | ORAL | Status: DC

## 2014-02-12 MED ORDER — HEPARIN SODIUM (PORCINE) 1000 UNIT/ML IJ SOLN
INTRAMUSCULAR | Status: AC
  Filled 2014-02-12: qty 1

## 2014-02-12 MED ORDER — AMOXICILLIN-POT CLAVULANATE 875-125 MG PO TABS
1.0000 | ORAL_TABLET | Freq: Two times a day (BID) | ORAL | Status: DC
  Administered 2014-02-12 – 2014-02-14 (×4): 1 via ORAL
  Filled 2014-02-12 (×5): qty 1

## 2014-02-12 MED ORDER — DEXTROSE 50 % IV SOLN
INTRAVENOUS | Status: AC
  Filled 2014-02-12: qty 50

## 2014-02-12 MED ORDER — SODIUM CHLORIDE 0.9 % IV SOLN
INTRAVENOUS | Status: DC
  Administered 2014-02-12: 100 mL/h via INTRAVENOUS

## 2014-02-12 MED ORDER — ATORVASTATIN CALCIUM 40 MG PO TABS
40.0000 mg | ORAL_TABLET | Freq: Every day | ORAL | Status: DC
  Administered 2014-02-12 – 2014-02-13 (×2): 40 mg via ORAL
  Filled 2014-02-12 (×3): qty 1

## 2014-02-12 MED ORDER — LIDOCAINE HCL (PF) 1 % IJ SOLN
INTRAMUSCULAR | Status: AC
  Filled 2014-02-12: qty 30

## 2014-02-12 MED ORDER — DIAZEPAM 5 MG PO TABS
10.0000 mg | ORAL_TABLET | Freq: Every day | ORAL | Status: DC
  Administered 2014-02-12: 10 mg via ORAL
  Filled 2014-02-12: qty 2

## 2014-02-12 MED ORDER — INSULIN ASPART 100 UNIT/ML ~~LOC~~ SOLN: 2.0000 [IU] | Freq: Three times a day (TID) | SUBCUTANEOUS | Status: DC

## 2014-02-12 MED ORDER — SODIUM CHLORIDE 0.9 % IJ SOLN: 3.0000 mL | INTRAMUSCULAR | Status: DC | PRN

## 2014-02-12 MED ORDER — NICOTINE 21 MG/24HR TD PT24
21.0000 mg | MEDICATED_PATCH | TRANSDERMAL | Status: DC
  Administered 2014-02-12 – 2014-02-13 (×2): 21 mg via TRANSDERMAL
  Filled 2014-02-12 (×3): qty 1

## 2014-02-12 MED ORDER — INSULIN ASPART 100 UNIT/ML ~~LOC~~ SOLN: 1.6700 [IU] | Freq: Three times a day (TID) | SUBCUTANEOUS | Status: DC

## 2014-02-12 MED ORDER — SENNOSIDES-DOCUSATE SODIUM 8.6-50 MG PO TABS
1.0000 | ORAL_TABLET | Freq: Every evening | ORAL | Status: DC | PRN
  Filled 2014-02-12: qty 1

## 2014-02-12 MED ORDER — HEPARIN SODIUM (PORCINE) 5000 UNIT/ML IJ SOLN
5000.0000 [IU] | Freq: Three times a day (TID) | INTRAMUSCULAR | Status: DC
  Administered 2014-02-12 – 2014-02-14 (×6): 5000 [IU] via SUBCUTANEOUS
  Filled 2014-02-12 (×8): qty 1

## 2014-02-12 MED ORDER — SODIUM CHLORIDE 0.9 % IV SOLN
INTRAVENOUS | Status: DC
  Administered 2014-02-12: 07:00:00 via INTRAVENOUS

## 2014-02-12 MED ORDER — HYDROCODONE-ACETAMINOPHEN 10-325 MG PO TABS: 1.0000 | ORAL_TABLET | ORAL | Status: DC | PRN

## 2014-02-12 MED ORDER — VERAPAMIL HCL 2.5 MG/ML IV SOLN
INTRAVENOUS | Status: AC
  Filled 2014-02-12: qty 2

## 2014-02-12 NOTE — CV Procedure (Signed)
    CARDIAC CATHETERIZATION  PROCEDURE:  Left heart catheterization with selective coronary angiography, left ventriculogram via the radial artery approach.  INDICATIONS:  48 year-old female with chest discomfort, severe, at rest, nuclear stress test showing ejection fraction of 30%, new finding. Prior cardiac catheterization in 2008-no CAD. Initially, white blood cell count was elevated at 20,000, on repeat 13,000 which is comparable to white blood cell count from one month ago. Also has hypokalemia and was given 40 mEq of potassium prior to procedure. She is quite anxious.  The risks, benefits, and details of the procedure were explained to the patient, including possibilities of stroke, heart attack, death, renal impairment, arterial damage, bleeding.  The patient verbalized understanding and wanted to proceed.  Informed written consent was obtained.  PROCEDURE TECHNIQUE:  Ultrasound was utilized for access. The right radial artery site was prepped and draped in a sterile fashion. One percent lidocaine was used for local anesthesia. Using the modified Seldinger technique a 5 French hydrophilic sheath was inserted into the radial artery without difficulty. 3 mg of verapamil was administered via the sheath. A Judkins right #4 catheter with the guidance of a Versicore wire was placed in the right coronary cusp and selectively cannulated the right coronary artery. After traversing the aortic arch, 4000 units of heparin IV was administered. A Judkins left #3.5 catheter was used but unsuccessful, Amplatz left #1 was successful at selectively cannulating the left main artery. Multiple views with hand injection of Omnipaque were obtained. Catheter a pigtail catheter was used to cross into the left ventricle, hemodynamics were obtained, and a left ventriculogram was performed in the RAO position with power injection. During the procedure, 200 mcg of nitroglycerin x2 was administered as well as several milligrams  of Versed, 7 mg and 100 mcg of fentanyl. Mild vasospasm/right arm discomfort when exchanging catheters. After procedure, arm was comfortable. Following the procedure, sheath was removed, patient was hemodynamically stable, hemostasis was maintained with a Terumo T band.   CONTRAST:  Total of 90 ml.    FLOUROSCOPY TIME: 8.2 min.  COMPLICATIONS:  None.    HEMODYNAMICS:  Aortic pressure was A999333; LV systolic pressure was [id number]; LVEDP 72mmHg.  There was no gradient between the left ventricle and aorta.    ANGIOGRAPHIC DATA:    Left main: No angiographically significant disease, branches into LAD and circumflex and large ramus branch.  Left anterior descending (LAD): No angiographically significant disease, there is one major diagonal branch, no significant disease. The LAD continues to the apex, tapers, small in caliber.  Circumflex artery (CIRC): Both ramus as well as circumflex branch, widely patent with no angiographically significant disease.  Right coronary artery (RCA): No angiographically significant disease, dominant vessel giving rise to posterior descending artery.  LEFT VENTRICULOGRAM:  Left ventricular angiogram was done in the 30 RAO projection and revealed normal left ventricular wall motion and systolic function with an estimated ejection fraction of 55 %. Nuclear stress test ejection fraction, falsely low.  IMPRESSIONS:  No angiographically significant CAD Normal left ventricular systolic function.  LVEDP 9 mmHg.  Ejection fraction 55 %.  RECOMMENDATION:  Reassuring cardiac catheterization. Her chest discomfort is secondary to musculoskeletal pain/fibromyalgia. No further cardiac workup necessary.

## 2014-02-12 NOTE — H&P (View-Only) (Signed)
[facility]. [address] [place], [place]  [address] Phone: [phone] Fax:  [phone]  Date:  02/05/2014   ID:  [name], [name] 07-02-1966, MRN [id number]  PCP:  Tamsen Roers, MD   History of Present Illness: [name] is a 48 y.o. female previously seen in 2009 here to evaluate chest pain. Has diagnosis of diabetes, hemoglobin A1c 8.8 on 11/14, with neuropathy, hypokalemia for the evaluation of chest pain. At prior catheterization in November of 2008 which showed no angiographically significant coronary artery disease.  Podiatritst had trouble getting pulses in feet. Had chest pain. Tightness. Can be at rest. Comes and goes. Feels short of breath with or without exertion. Unfortunately has not been able to fully quit smoking. Has excruciating back pain. Unable to walk treadmill.  She also has diminished sensation in her legs/feet bilaterally with pain when walking.  Trigs 1500 at one point she states, currently 280.   She states that she has pain sensation in her hands bilaterally, feels as though there is blistering occurring from the inside out.  Echocardiogram demonstrated EF of 30%.  02/05/14 Both she and her husband are vigorous smokers. She came in today for blood pressure check and complaint of chest discomfort. I personally examined her. Her chest pain has been constant, somewhat positional. She points to the exact area where the pain is occurring, approximately quarter size left of sternum. High anxiety at baseline. Aspirin was given.   EKG performed shows no evidence of ST segment changes.  Wt Readings from Last 3 Encounters:  02/04/14 186 lb 6.4 oz (84.55 kg)  01/31/14 185 lb (83.915 kg)  01/14/14 190 lb (86.183 kg)     Past Medical History  Diagnosis Date  . Hypertension   . Arthritis   . Sleep apnea     didn't tolerate cpap  . Hyperlipemia   . GERD (gastroesophageal reflux disease)   . Insomnia   . SUI (stress urinary incontinence,  female) S/P SLING 12-29-2011  . SOB (shortness of breath) on exertion   . Diabetes mellitus   . Blood transfusion without reported diagnosis   . Bipolar disorder   . Neurogenic bladder   . Hypercholesterolemia   . Gastroparesis   . Ulcer   . MRSA (methicillin resistant Staphylococcus aureus)   . Chronic pain   . Edema   . Chest pain     a. 2008 Cath: normal cors;  b. 12/2013 Lexi CL: EF 47%, no ischemia/infarct.  . Anxiety   . Vertigo   . DJD (degenerative joint disease)   . COPD (chronic obstructive pulmonary disease)   . Cholecystitis   . Hypersomnia   . Tobacco abuse   . Claudication     a. 12/2013 ABI's: R 0.97, L 0.94.  [name] Palpitations     Past Surgical History  Procedure Laterality Date  . Knee arthroscopy w/ allograft impant      LEFT KNEE, graft x 2  . Foot surgery      bilateral  . Anterior cervical decomp/discectomy fusion  2000    C6 - 7  . Pubovaginal sling  12/29/2011    Procedure: Gaynelle Arabian;  Surgeon: Reece Packer, MD;  Location: [facility];  Service: Urology;  Laterality: N/A;  cysto and sparc sling   . Lumbar fusion    . Total abdominal hysterectomy w/ bilateral salpingoophorectomy  1997  . Multiple laparoscopies for endometriosis    . Cesarean  section      X2  . Repeat reconstruction acl left knee/ screws removed  03-28-2000    CADAVER GRAFT  . Reconsturction of congenital uterus anomaly  AGE 75  . Knee surgery      TOTAL 8 SURG'S  . Cardiac catheterization  05-22-2008   DR Dontea Corlew    NO SIGNIFECANT CAD/ NORMAL LV/  EF 65%/  NO WALL MOTION ABNORMALITIES  . Cystoscopy with injection  05/04/2012    Procedure: CYSTOSCOPY WITH INJECTION;  Surgeon: Reece Packer, MD;  Location: [facility];  Service: Urology;  Laterality: N/A;  MACROPLASTIQUE INJECTION  . Cystoscopy with injection  08/28/2012    Procedure: CYSTOSCOPY WITH INJECTION;  Surgeon: Reece Packer, MD;  Location: [facility];   Service: Urology;  Laterality: N/A;  cysto and macroplastique   . Right carpal tunnel surgery  04-25-2013  . Cysto N/A 04/30/2013    Procedure: CYSTOSCOPY;  Surgeon: Reece Packer, MD;  Location: [facility];  Service: Urology;  Laterality: N/A;  . Pubovaginal sling N/A 04/30/2013    Procedure: REMOVAL OF VAGINAL MESH;  Surgeon: Reece Packer, MD;  Location: [facility];  Service: Urology;  Laterality: N/A;    Current Outpatient Prescriptions  Medication Sig Dispense Refill  . acetaminophen-codeine (TYLENOL #3) 300-30 MG per tablet       . aspirin 325 MG tablet Take 325 mg by mouth daily as needed for headache.      . chlorproMAZINE (THORAZINE) 100 MG tablet Take 100 mg by mouth 3 (three) times daily.      . clonazePAM (KLONOPIN) 2 MG tablet Take 4 mg by mouth at bedtime. For sleep      . esomeprazole (NEXIUM) 40 MG capsule Take 40 mg by mouth at bedtime.       [name] glucose blood (FREESTYLE LITE) test strip Use as instructed to check blood sugars 4 times per day  132 each  5  . insulin aspart (NOVOLOG) 100 UNIT/ML injection To use with V-Go 40 Pump      . Insulin Disposable Pump (V-GO 40) KIT by Does not apply route.      . INVOKANA 100 MG TABS       . lidocaine (XYLOCAINE) 5 % ointment Apply 1 application topically as needed.  35.44 g  0  . NOVOLOG 100 UNIT/ML injection TO USE WITH V-GO PUMP  30 mL  1  . oxycodone (OXY-IR) 5 MG capsule Take 5 mg by mouth every 4 (four) hours as needed for pain.      . potassium chloride SA (K-DUR,KLOR-CON) 20 MEQ tablet 2 tabs BID      . promethazine (PHENERGAN) 25 MG tablet Take 1 tablet (25 mg total) by mouth every 8 (eight) hours as needed for nausea or vomiting.  15 tablet  0  . ramipril (ALTACE) 5 MG capsule Take 1 capsule (5 mg total) by mouth daily.  90 capsule  3  . spironolactone (ALDACTONE) 50 MG tablet Take 1 tablet (50 mg total) by mouth daily.  30 tablet  3   No current facility-administered medications for this visit.    Allergies:    Allergies    Allergen Reactions  . Latex Itching, Rash and Other (See Comments)    Pt states she cannot use condoms - cause an infection.  Use of latex on skin is okay.  Tape causes rash  . Ibuprofen Other (See Comments)    headaches  . Mirtazapine   .  Sweetening Enhancer [Flavoring Agent] Nausea And Vomiting    And headaches  . Tetracyclines & Related   . Trazodone And Nefazodone Other (See Comments)    Hallucinations   . Triazolam Other (See Comments)    hallucinations  . Aspartame And Phenylalanine Nausea And Vomiting    headaches    Social History:  The patient  reports that she has been smoking Cigarettes.  She has a 105 pack-year smoking history. She has never used smokeless tobacco. She reports that she does not drink alcohol or use illicit drugs. Recently started back up.   Family History  Problem Relation Age of Onset  . Hypertension Mother   . Diabetes Mother     ROS:  Please see the history of present illness.   Denies any fevers, chills, orthopnea, PND, strokelike symptoms, bruising, rashes, bleeding   +Back pain, chronic. All other systems reviewed and negative.   PHYSICAL EXAM: VS:  There were no vitals taken for this visit.  Well nourished, well developed, anxious at baseline HEENT: normal, [place]/AT, EOMI Neck: no JVD, normal carotid upstroke, no bruit Cardiac:  normal S1, S2; RRR; no murmurPinpoint chest soreness left of sternum. Lungs:  clear to auscultation bilaterally, no wheezing, rhonchi or rales Abd: soft, nontender, no hepatomegaly, no bruits Ext: no edema, diminished distal pulses Skin: warm and dry GU: deferred Neuro: no focal abnormalities noted, AAO x 3  EKG:   sinus rhythm, 90, no other abnormalities Labs: 12/13/13-Potassium 3.2, LDL 81, creatinine 1.0     ASSESSMENT AND PLAN:  1. Chest pain-she is quite anxious, this is her baseline. At prior visit when discussing cardiac catheterization she was very concerned/nervous. Her chest pain is pinpoint in  character. Atypical. EKG reassuring. Aspirin given. Reassurance. She will have cardiac catheterization soon. If symptoms become more worrisome she may proceed to emergency room.  2. Cardiomyopathy-newly discovered ejection fraction of 30%. We will proceed with cardiac catheterization to exclude ischemic cardiomyopathy or severe coronary artery disease as cause. We will proceed with radial artery catheterization, morning of February 18 Wednesday. She very clearly stated to me that she wants to be asleep for the procedure, very nervous about this. We will make sure that this is the case. Risks and benefits of procedure including stroke, heart attack, death, renal impairment, bleeding have been explained to the patient. 3. Hypokalemia -- potassium 3.2 on 12/13/13. Potassium sparing diuretic. Per Dr. Vaughan Sine. 4. Chest pain-atypical, EKG reassuring, nuclear stress test on 01/09/14 did not show any evidence of perfusion defects however ejection fraction was reduced, estimated at 47%. Echocardiogram, upon visual, 30%. At prior cardiac catheterization in November of 2008 which showed no angiographically significant coronary artery disease. 5. Diabetes-uncontrolled-strongly encouraged continued compliance. Hemoglobin A1c 8.811/12/14. 6. Hyperlipidemia-LDL 56, triglycerides 280. Has diabetes becomes better controlled, triglycerides will also be better controlled. Would consider statin therapy if she is able to tolerate given her diabetic status. 7. Claudication/diminished lower extremity pulses - ABI 01/13/14-reassuring. 8. We will fax reports to Ed Blalock, LCSW Fax [phone]. Tel [phone].   Signed, Candee Furbish, MD [facility]  02/05/2014 4:15 PM    Addendum: Precatheterization lab work came back showing white blood cell count of 20. In previous review of records, she has been 77, 10. She denied any recent fevers, worsening cough. I reviewed her medications and I do not see any recent prednisone. I placed a  call to her and left a detailed voice message. I would like for Dr. Dwyane Dee to evaluate her  as well for signs of any infection. It appears that she has not seen Dr. Rex Kras for several years. I also told her that if symptoms were to arise or become more worrisome to report to urgent care or emergency department.

## 2014-02-12 NOTE — Progress Notes (Signed)
Unable to start 2nd IV line; pt. Extremely anxious and upset; also states she has no transportation home and plan to spend the night with boyfriend who is presently an inpatient on [address].; information relayed to Eliezer Champagne, RN

## 2014-02-12 NOTE — Progress Notes (Signed)
Report called to [address] [place].  Pt transported via  [employer].

## 2014-02-12 NOTE — Progress Notes (Signed)
After pt return from ambulating from bathroom pt complain that she had problems with her left eye.  Upon during a neuro check noted left eye unable to follow movement.  Dr Gillian Shields called and he came to check patient and CODE Stroke was called.  Code Stroke team arrived and decided to take pt for a CT Scan.

## 2014-02-12 NOTE — Progress Notes (Signed)
Inpatient Diabetes Program Recommendations  AACE/ADA: New Consensus Statement on Inpatient Glycemic Control (2013)  Target Ranges:  Prepandial:   less than 140 mg/dL      Peak postprandial:   less than 180 mg/dL (1-2 hours)      Critically ill patients:  140 - 180 mg/dL   Reason for Visit: call received regarding patient using V-go insulin infusion set/  According to Medication reconciliation she uses a V-go 40 which infuses 1.67 units/hr of insulin. The patient can bolus in 2 unit increments up to 36 units/day.  This device must be changed every 24 hours.  Instructed RN to ask patient when she is due to change site.  If she is due to change tonight, she will need to have family bring her V-Go supplies.  If this is not available, will need basal/bolus insulin regimen (Lantus 30 units q HS, Novolog moderate tid with meals, and Novolog meal coverage 4 units tid with meals)-ONLY order if V-GO removed.  V-Go device will not work after 24 hours of being placed.    Adah Perl, RN, BC-ADM Inpatient Diabetes Coordinator Pager [phone]

## 2014-02-12 NOTE — Progress Notes (Signed)
Received pt from cath procedure alert and denies any discomfort.  Pt able tolerated fluids and food

## 2014-02-12 NOTE — Progress Notes (Signed)
Came to assess secondary to visual disturbance.  She noted this when getting up to go to the bathroom.   CN: Difficulty with medial movement past midline left eye (medial rectus).   No nausea, no vomiting. No CP. No trouble with speech.  No obvious weakness.   Called code stroke team.   Blood sugar was 139.

## 2014-02-12 NOTE — Code Documentation (Signed)
48yo female s/p heart cath today.  Patient tolerated the procedure well per MD and RN and was at her baseline post procedure.  Patient was LKW at 1000 when she went to sleep post procedure.  RN noticed patient to have dizziness and double vision when ambulating around 1300.  Dr. Etter Sjogren at bedside and performed a neuro assessment and found the patient to have a left eye right gaze palsy.  Code Stroke called.  Stroke team to bedside.  Patient taken to CT.  Dr. Leonel Ramsay to bedside.  NIHSS 1, see documentation for details.  Patient with a h/o DM, CBG 146.  No acute stroke treatment at this time per Dr. Leonel Ramsay.  Stroke swallow screen completed and patient passed.  Patient to be admitted to the hospital for a stroke work up.  Bedside handoff with Short Stay RN Mickel Baas.  Patient updated on plan of care.

## 2014-02-12 NOTE — Interval H&P Note (Signed)
History and Physical Interval Note:  02/12/2014 8:46 AM  [name]  has presented today for surgery, with the diagnosis of Cardiomyopathy  The various methods of treatment have been discussed with the patient and family. After consideration of risks, benefits and other options for treatment, the patient has consented to  Procedure(s): LEFT HEART CATHETERIZATION WITH CORONARY ANGIOGRAM (N/A) as a surgical intervention .  The patient's history has been reviewed, patient examined, no change in status, stable for surgery.  I have reviewed the patient's chart and labs.  Questions were answered to the patient's satisfaction.    Cath Lab Visit (complete for each Cath Lab visit)  Clinical Evaluation Leading to the Procedure:   ACS: no  Non-ACS:    Anginal Classification: CCS IV  Anti-ischemic medical therapy: No Therapy  Non-Invasive Test Results: High-risk stress test findings: cardiac mortality >3%/year EF 30%.   Prior CABG: No previous CABG    Dorota Heinrichs

## 2014-02-12 NOTE — H&P (Addendum)
[facility] History and Physical  [name] MWU:[id number] DOB: Feb 10, 1966 DOA: 02/12/2014  Referring physician: Dr. Marlou Porch PCP: Tamsen Roers, MD   Chief Complaint: diplopia  HPI: [name] is a 48 y.o. female  Past medical history of systolic heart failure with an EF of 30-35% on 01/29/2013, diabetes insulin-dependent currently on an insulin pump with the last hemoglobin A1c of 9.1 back in January 2015, peripheral neuropathy due to diabetes, hypertension, alcohol abuse daily had a cath date of admission. Following the procedure was in her normal state. Around 10am, she took a nap and on awakening had diploplia. Code stroke was called neurology evaluate her and deemed to not candidate for TPA. So we are consulted for further evaluation.   Review of Systems:  Constitutional:  No weight loss, night sweats, Fevers, chills, fatigue.  HEENT:  No headaches, Difficulty swallowing,Tooth/dental problems,Sore throat,  No sneezing, itching, ear ache, nasal congestion, post nasal drip,  Cardio-vascular:  No chest pain, Orthopnea, PND, swelling in lower extremities, anasarca, dizziness, palpitations  GI:  No heartburn, indigestion, abdominal pain, nausea, vomiting, diarrhea, change in bowel habits, loss of appetite  Resp:  No shortness of breath with exertion or at rest. No excess mucus, no productive cough, No non-productive cough, No coughing up of blood.No change in color of mucus.No wheezing.No chest wall deformity  Skin:  no rash or lesions.  GU:  no dysuria, change in color of urine, no urgency or frequency. No flank pain.  Musculoskeletal:  No joint pain or swelling. No decreased range of motion. No back pain.  Psych:  No change in mood or affect. No depression or anxiety. No memory loss.   Past Medical History  Diagnosis Date  . Hypertension   . Arthritis   . Sleep apnea     didn't tolerate cpap  . Hyperlipemia   . GERD (gastroesophageal reflux disease)   .  Insomnia   . SUI (stress urinary incontinence, female) S/P SLING 12-29-2011  . SOB (shortness of breath) on exertion   . Diabetes mellitus   . Blood transfusion without reported diagnosis   . Bipolar disorder   . Neurogenic bladder   . Hypercholesterolemia   . Gastroparesis   . Ulcer   . MRSA (methicillin resistant Staphylococcus aureus)   . Chronic pain   . Edema   . Chest pain     a. 2008 Cath: normal cors;  b. 12/2013 Lexi CL: EF 47%, no ischemia/infarct.  . Anxiety   . Vertigo   . DJD (degenerative joint disease)   . COPD (chronic obstructive pulmonary disease)   . Cholecystitis   . Hypersomnia   . Tobacco abuse   . Claudication     a. 12/2013 ABI's: R 0.97, L 0.94.  [name] Palpitations    Past Surgical History  Procedure Laterality Date  . Knee arthroscopy w/ allograft impant      LEFT KNEE, graft x 2  . Foot surgery      bilateral  . Anterior cervical decomp/discectomy fusion  2000    C6 - 7  . Pubovaginal sling  12/29/2011    Procedure: Gaynelle Arabian;  Surgeon: Reece Packer, MD;  Location: [facility];  Service: Urology;  Laterality: N/A;  cysto and sparc sling   . Lumbar fusion    . Total abdominal hysterectomy w/ bilateral salpingoophorectomy  1997  . Multiple laparoscopies for endometriosis    . Cesarean section      X2  . Repeat reconstruction acl  left knee/ screws removed  03-28-2000    CADAVER GRAFT  . Reconsturction of congenital uterus anomaly  AGE 15  . Knee surgery      TOTAL 8 SURG'S  . Cardiac catheterization  05-22-2008   DR SKAINS    NO SIGNIFECANT CAD/ NORMAL LV/  EF 65%/  NO WALL MOTION ABNORMALITIES  . Cystoscopy with injection  05/04/2012    Procedure: CYSTOSCOPY WITH INJECTION;  Surgeon: Reece Packer, MD;  Location: [facility];  Service: Urology;  Laterality: N/A;  MACROPLASTIQUE INJECTION  . Cystoscopy with injection  08/28/2012    Procedure: CYSTOSCOPY WITH INJECTION;  Surgeon: Reece Packer, MD;   Location: [facility];  Service: Urology;  Laterality: N/A;  cysto and macroplastique   . Right carpal tunnel surgery  04-25-2013  . Cysto N/A 04/30/2013    Procedure: CYSTOSCOPY;  Surgeon: Reece Packer, MD;  Location: [facility];  Service: Urology;  Laterality: N/A;  . Pubovaginal sling N/A 04/30/2013    Procedure: REMOVAL OF VAGINAL MESH;  Surgeon: Reece Packer, MD;  Location: [facility];  Service: Urology;  Laterality: N/A;   Social History:  reports that she has been smoking Cigarettes.  She has a 105 pack-year smoking history. She has never used smokeless tobacco. She reports that she drinks about 1.2 ounces of alcohol per week. She reports that she does not use illicit drugs.  Allergies  Allergen Reactions  . Latex Itching, Rash and Other (See Comments)    Pt states she cannot use condoms - cause an infection.  Use of latex on skin is okay.  Tape causes rash  . Ibuprofen Other (See Comments)    headaches  . Sweetening Enhancer [Flavoring Agent] Nausea And Vomiting    And headaches  . Tetracyclines & Related Nausea And Vomiting    Yeast infections  . Trazodone And Nefazodone Other (See Comments)    Hallucinations   . Triazolam Other (See Comments)    hallucinations  . Aspartame And Phenylalanine Nausea And Vomiting    headaches    Family History  Problem Relation Age of Onset  . Hypertension Mother   . Diabetes Mother   . Cancer - Other Mother   . Cancer - Other Father   . Heart attack Father      Prior to Admission medications   Medication Sig Start Date End Date Taking? Authorizing Provider  acetaminophen-codeine (TYLENOL #3) 300-30 MG per tablet Take 1 tablet by mouth every 6 (six) hours as needed for moderate pain.  01/27/14  Yes Historical Provider, MD  amitriptyline (ELAVIL) 150 MG tablet Take 150 mg by mouth at bedtime.   Yes Historical Provider, MD  amoxicillin-clavulanate (AUGMENTIN) 875-125 MG per tablet Take 1 tablet by mouth 2 (two) times daily.    Yes Historical Provider, MD  aspirin 325 MG tablet Take 325 mg by mouth daily as needed for headache.   Yes Historical Provider, MD  chlorproMAZINE (THORAZINE) 100 MG tablet Take 100 mg by mouth at bedtime.    Yes Historical Provider, MD  diazepam (VALIUM) 10 MG tablet Take 10 mg by mouth at bedtime.   Yes Historical Provider, MD  HYDROcodone-acetaminophen (NORCO) 10-325 MG per tablet Take 1 tablet by mouth every 4 (four) hours as needed for moderate pain.   Yes Historical Provider, MD  insulin aspart (NOVOLOG) 100 UNIT/ML injection To use with V-Go 40 Pump 07/22/13  Yes Elayne Snare, MD  insulin aspart (NOVOLOG) 100 UNIT/ML injection TO USE  WITH V-GO PUMP   Yes Elayne Snare, MD  Insulin Disposable Pump (V-GO 40) KIT by Does not apply route.   Yes Historical Provider, MD  INVOKANA 100 MG TABS Take 100 mg by mouth daily.  02/02/14  Yes Historical Provider, MD  lidocaine (XYLOCAINE) 5 % ointment Apply 1 application topically daily as needed. 02/04/14  Yes Elayne Snare, MD  oxycodone (OXY-IR) 5 MG capsule Take 5 mg by mouth every 4 (four) hours as needed for pain.   Yes Historical Provider, MD  ramipril (ALTACE) 5 MG capsule Take 5 mg by mouth daily. 02/04/14   Elayne Snare, MD   Physical Exam: Filed Vitals:   02/12/14 1413  BP:   Pulse:   Temp: 97.8 F (36.6 C)  Resp:     BP 131/76  Pulse 72  Temp(Src) 97.8 F (36.6 C) (Oral)  Resp 16  Ht '5\' 5"'  (1.651 m)  Wt 83.008 kg (183 lb)  BMI 30.45 kg/m2  SpO2 96%  General:  Appears calm and comfortable Eyes: PERRL, normal lids, irises & conjunctiva ENT: grossly normal hearing, lips & tongue Neck: no LAD, masses or thyromegaly Cardiovascular: RRR, no m/r/g. No LE edema. Telemetry: SR, no arrhythmias  Respiratory: CTA bilaterally, no w/r/r. Normal respiratory effort. Abdomen: soft, ntnd Skin: no rash or induration seen on limited exam Musculoskeletal: grossly normal tone BUE/BLE Psychiatric: grossly normal mood and affect, speech fluent and  appropriate Neurologic: She has no facial droop no pronator drift muscle strength 5 over 5 on all extremities, there is hyperesthesia on her left elbow which is not new.deep tendon reflexes are symmetrical bilaterally. Sensation is intact. She has left upper eyelid droop. She also has difficulty converging her eyes and her left eye lags compared to the right.           Labs on Admission:  Basic Metabolic Panel:  Recent Labs Lab 02/05/14 1526 02/12/14 0630 02/12/14 1351  NA 139  --  142  K 3.4* 3.0* 3.9  CL 101  --  104  CO2 29  --  27  GLUCOSE 165*  --  153*  BUN 4*  --  6  CREATININE 0.6  --  0.63  CALCIUM 8.8  --  8.7   Liver Function Tests:  Recent Labs Lab 02/12/14 1351  AST 12  ALT 10  ALKPHOS 68  BILITOT <0.2*  PROT 5.8*  ALBUMIN 2.7*   No results found for this basename: LIPASE, AMYLASE,  in the last 168 hours No results found for this basename: AMMONIA,  in the last 168 hours CBC:  Recent Labs Lab 02/05/14 1526 02/10/14 1142 02/12/14 1351  WBC 21.4* 13.1* 8.9  NEUTROABS 15.4* 9.6*  --   HGB 13.5 12.6 12.1  HCT 40.4 38.3 34.7*  MCV 93.9 94.4 90.8  PLT 230.0 211.0 216   Cardiac Enzymes: No results found for this basename: CKTOTAL, CKMB, CKMBINDEX, TROPONINI,  in the last 168 hours  BNP (last 3 results) No results found for this basename: PROBNP,  in the last 8760 hours CBG:  Recent Labs Lab 02/12/14 0732 02/12/14 0809 02/12/14 0832 02/12/14 1310 02/12/14 1359  GLUCAP 67* 55* 98 139* 146*    Radiological Exams on Admission: Ct Head Wo Contrast  02/12/2014   CLINICAL DATA:  Dizziness and ophthalmoplegia  EXAM: CT HEAD WITHOUT CONTRAST  TECHNIQUE: Contiguous axial images were obtained from the base of the skull through the vertex without intravenous contrast.  COMPARISON:  None.  FINDINGS: Ventricles are normal in  size and configuration. There is no mass, hemorrhage, extra-axial fluid collection, or midline shift. No focal gray-white compartment  lesions are identified. There is no demonstrable acute infarct. Bony calvarium appears intact. The mastoid air cells are clear.  IMPRESSION: Study within normal limits. No demonstrable acute infarct. No hemorrhage or mass effect.  Critical Value/emergent results were called by telephone at the time of interpretation on 02/12/2014 at 1:53 PM to Dr. Roland Rack , who verbally acknowledged these results.   Electronically Signed   By: Lowella Grip M.D.   On: 02/12/2014 13:53    EKG: Independently reviewed. Pending at time of this dictation  Assessment/Plan  CVA (cerebral infarction) - HgbA1c 9.1, fasting lipid panel start a statin. - MRI, MRA of the brain without contrast  - PT consult, OT consult, Speech consult  - Echocardiogram  - Carotid dopplers  - Prophylactic therapy-Antiplatelet med: Aspirin - dose 325 mg PO daily  - Avoid D5 fluids as may be harmfull - risk factor modification  - Cardiac Monitoring  - Neurochecks q4h  - Keep MAP 70. - Check a UDS start her on nicotine patch.  Unspecified essential hypertension: - Stable continue current home meds.   Type II or unspecified type diabetes mellitus with neurological manifestations, uncontrolled - CBGs a.c. and at bedtime patient continued her insulin pump.  Polyneuropathy in diabetes(357.2) - Continue Elavil.  Consulted neurology Code Status: full Family Communication: none Disposition Plan: inpatient  Time spent: [place], [place] [facility] Pager [phone]

## 2014-02-12 NOTE — Progress Notes (Signed)
Discussed with neurology. Given mild severity of intranuclear ophthamoplegia, no TPA. Admitting. Appreciate assistance from hospitalist service given complexity of diabetic and other medical/physciatric regiment.  She did not have any symptoms immediately post catheterization. The visual disturbance occurred about 3 hours post cath.   Will continue to follow. Discussed with Ms. Stay.

## 2014-02-12 NOTE — Consult Note (Signed)
Neurology Consultation Reason for Consult: Diploplia Referring Physician: Derl Barrow  CC: Diploplia  History is obtained from:Patient, medical record  HPI: [name] is a 48 y.o. female with a history of DM, hyperlipidemia, htn who underwent catheterization earlier today and followign the procedure was in her normal state. Around 10am, she took a nap and on awakening had diploplia. She was able to ambulate to the bathroom earlier. She denies vertigo, numbness, dizziness or other symptoms.    LKW: 10am tpa given?: no, mild deficits    ROS: A 14 point ROS was performed and is negative except as noted in the HPI.  Past Medical History  Diagnosis Date  . Hypertension   . Arthritis   . Sleep apnea     didn't tolerate cpap  . Hyperlipemia   . GERD (gastroesophageal reflux disease)   . Insomnia   . SUI (stress urinary incontinence, female) S/P SLING 12-29-2011  . SOB (shortness of breath) on exertion   . Diabetes mellitus   . Blood transfusion without reported diagnosis   . Bipolar disorder   . Neurogenic bladder   . Hypercholesterolemia   . Gastroparesis   . Ulcer   . MRSA (methicillin resistant Staphylococcus aureus)   . Chronic pain   . Edema   . Chest pain     a. 2008 Cath: normal cors;  b. 12/2013 Lexi CL: EF 47%, no ischemia/infarct.  . Anxiety   . Vertigo   . DJD (degenerative joint disease)   . COPD (chronic obstructive pulmonary disease)   . Cholecystitis   . Hypersomnia   . Tobacco abuse   . Claudication     a. 12/2013 ABI's: R 0.97, L 0.94.  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   Exam: Current vital signs: BP 131/76  Pulse 72  Temp(Src) 97.6 F (36.4 C) (Oral)  Resp 16  Ht 5\' 5"  (1.651 m)  Wt 83.008 kg (183 lb)  BMI 30.45 kg/m2  SpO2 96% Vital signs in last 24 hours: Temp:  [97.4 F (36.3 C)-97.6 F (36.4 C)] 97.6 F (36.4 C) (02/18 0955) Pulse Rate:  [72-91] 72 (02/18  1225) Resp:  [16-18] 16 (02/18 1300) BP: (106-138)/(63-76) 131/76 mmHg (02/18 1300) SpO2:  [96 %-100 %] 96 % (02/18 1131) Weight:  [83.008 kg (183 lb)] 83.008 kg (183 lb) (02/18 0623)  General: in bed, NAD. Though possibly mild dysarthria,  CV: RRR Mental Status: Patient is awake, alert, oriented to person, place, month, year, and situation. Immediate and remote memory are intact. Patient is able to give a clear and coherent history. No signs of aphasia or neglect Cranial Nerves: II: Visual Fields are full. Pupils are equal, round, and reactive to light.  Discs are difficult to visualize. III,IV, VI: she has a internuclear ophthalmoplegia with impaired adduction on the left and nystagmus of the rigth abducting eye.  V: Facial sensation is symmetric to temperature VII: Facial movement is symmetric.  VIII: hearing is intact to voice X: Uvula elevates symmetrically XI: Shoulder shrug is symmetric. XII: tongue is midline without atrophy or fasciculations.  Motor: Tone is normal. Bulk is normal. 5/5 strength was present in all four extremities.  Sensory: Sensation is intact to LT and pin. Deep Tendon Reflexes: 2+ and symmetric in the biceps and patellae.  Plantars: Toes are downgoing bilaterally.  Cerebellar: FNF and HKS are intact bilaterally Gait: Patient was able to stand with only mild  unsteadiness, unable to test further due to monitors.    I have reviewed labs in epic and the results pertinent to this consultation are: nml WBC, nml coags  I have reviewed the images obtained:CT head - negative  Impression: 48 yo F with new onset INO s/p catheter angiogram. I suspect a small new stroke, but after discussing risks and benefits of IV tPA, did not decide to pursue tPA given the risks and relatively mild symptoms. Possiblities would include thrombotic, or more likely embolic from a disruptions from the angiogram earlier. She will need to be admitted for observation, and would  favor at least pursuing small vessel risk factor modification given her multiple risk factors.   Recommendations: 1. HgbA1c, fasting lipid panel 2. MRI, MRA  of the brain without contrast 3. Frequent neuro checks 4. Echocardiogram 5. Prophylactic therapy-Antiplatelet med: Aspirin - dose 325mg  6. Risk factor modification 7. Telemetry monitoring 8. PT consult, OT consult, Speech consult   Roland Rack, MD [facility] [phone]  If 7pm- 7am, please page neurology on call at [phone].

## 2014-02-13 ENCOUNTER — Inpatient Hospital Stay (HOSPITAL_COMMUNITY): Payer: Medicare Other

## 2014-02-13 DIAGNOSIS — I6789 Other cerebrovascular disease: Secondary | ICD-10-CM

## 2014-02-13 DIAGNOSIS — I499 Cardiac arrhythmia, unspecified: Secondary | ICD-10-CM

## 2014-02-13 DIAGNOSIS — E119 Type 2 diabetes mellitus without complications: Secondary | ICD-10-CM

## 2014-02-13 DIAGNOSIS — R079 Chest pain, unspecified: Secondary | ICD-10-CM

## 2014-02-13 DIAGNOSIS — I428 Other cardiomyopathies: Secondary | ICD-10-CM

## 2014-02-13 HISTORY — PX: TRANSTHORACIC ECHOCARDIOGRAM: SHX275

## 2014-02-13 LAB — LIPID PANEL
Cholesterol: 162 mg/dL (ref 0–200)
HDL: 29 mg/dL — AB (ref >39)
LDL Cholesterol: UNDETERMINED mg/dL (ref 0–99)
Total CHOL/HDL Ratio: 5.6 RATIO
Triglycerides: 462 mg/dL — ABNORMAL HIGH (ref <150)
VLDL: UNDETERMINED mg/dL (ref 0–40)

## 2014-02-13 LAB — GLUCOSE, CAPILLARY
GLUCOSE-CAPILLARY: 153 mg/dL — AB (ref 70–99)
GLUCOSE-CAPILLARY: 157 mg/dL — AB (ref 70–99)
GLUCOSE-CAPILLARY: 211 mg/dL — AB (ref 70–99)
Glucose-Capillary: 155 mg/dL — ABNORMAL HIGH (ref 70–99)
Glucose-Capillary: 180 mg/dL — ABNORMAL HIGH (ref 70–99)

## 2014-02-13 LAB — RAPID URINE DRUG SCREEN, HOSP PERFORMED
Amphetamines: NOT DETECTED
BARBITURATES: NOT DETECTED
Benzodiazepines: POSITIVE — AB
Cocaine: NOT DETECTED
Opiates: POSITIVE — AB
Tetrahydrocannabinol: NOT DETECTED

## 2014-02-13 MED ORDER — INSULIN GLARGINE 100 UNIT/ML ~~LOC~~ SOLN
35.0000 [IU] | Freq: Every day | SUBCUTANEOUS | Status: DC
  Administered 2014-02-13 – 2014-02-14 (×2): 35 [IU] via SUBCUTANEOUS
  Filled 2014-02-13 (×2): qty 0.35

## 2014-02-13 MED ORDER — ASPIRIN 325 MG PO TABS
325.0000 mg | ORAL_TABLET | Freq: Every day | ORAL | Status: DC
  Administered 2014-02-13 – 2014-02-14 (×2): 325 mg via ORAL
  Filled 2014-02-13 (×2): qty 1

## 2014-02-13 MED ORDER — INSULIN ASPART 100 UNIT/ML ~~LOC~~ SOLN
0.0000 [IU] | Freq: Three times a day (TID) | SUBCUTANEOUS | Status: DC
  Administered 2014-02-13: 3 [IU] via SUBCUTANEOUS
  Administered 2014-02-13: 5 [IU] via SUBCUTANEOUS
  Administered 2014-02-14 (×2): 3 [IU] via SUBCUTANEOUS

## 2014-02-13 MED ORDER — STROKE: EARLY STAGES OF RECOVERY BOOK
Freq: Once | Status: AC
  Administered 2014-02-13: 1
  Filled 2014-02-13: qty 1

## 2014-02-13 NOTE — Progress Notes (Addendum)
Stroke Team Progress Note  HISTORY [name] is a 48 y.o. female with a history of DM, hyperlipidemia, htn who underwent catheterization earlier today 02/12/2014 and followign the procedure was in her normal state. Around 10am, she took a nap and on awakening had diploplia. She was able to ambulate to the bathroom earlier. She denies vertigo, numbness, dizziness or other symptoms. Patient was not administerd TPA secondary to mild deficits. She was admitted for further evaluation and treatment.  SUBJECTIVE She still has some intermittent diplopia but otherwise is doing well and has no new complaints  OBJECTIVE Most recent Vital Signs: Filed Vitals:   02/13/14 0000 02/13/14 0200 02/13/14 0400 02/13/14 0600  BP: 161/85 139/72 128/59 141/79  Pulse: 105 88 78 94  Temp:    97.9 F (36.6 C)  TempSrc:    Oral  Resp: 20 20 20 20   Height:      Weight:      SpO2: 99% 98%  98%   CBG (last 3)   Recent Labs  02/12/14 2131 02/13/14 0148 02/13/14 0708  GLUCAP 178* 153* 157*    IV Fluid Intake:   . sodium chloride 1 mL/kg/hr (02/12/14 1751)    MEDICATIONS  . amitriptyline  150 mg Oral QHS  . amoxicillin-clavulanate  1 tablet Oral BID  . atorvastatin  40 mg Oral q1800  . Canagliflozin  100 mg Oral Daily  . chlorproMAZINE  100 mg Oral QHS  . diazepam  10 mg Oral QHS  . heparin  5,000 Units Subcutaneous 3 times per day  . insulin pump   Subcutaneous TID AC, HS, 0200  . nicotine  21 mg Transdermal Q24H  . pneumococcal 23 valent vaccine  0.5 mL Intramuscular Tomorrow-1000  . ramipril  5 mg Oral Daily   PRN:  acetaminophen-codeine, aspirin, HYDROcodone-acetaminophen, oxyCODONE, senna-docusate  Diet:  Carb Control thin liquids Activity:   Bathroom privileges with assistance DVT Prophylaxis:  Heparin 5000 units sq tid   CLINICALLY SIGNIFICANT STUDIES Basic Metabolic Panel:   Recent Labs Lab 02/12/14 0630 02/12/14 1351  NA  --  142  K 3.0* 3.9  CL  --  104  CO2  --  27   GLUCOSE  --  153*  BUN  --  6  CREATININE  --  0.63  CALCIUM  --  8.7   Liver Function Tests:   Recent Labs Lab 02/12/14 1351  AST 12  ALT 10  ALKPHOS 68  BILITOT <0.2*  PROT 5.8*  ALBUMIN 2.7*   CBC:   Recent Labs Lab 02/10/14 1142 02/12/14 1351  WBC 13.1* 8.9  NEUTROABS 9.6*  --   HGB 12.6 12.1  HCT 38.3 34.7*  MCV 94.4 90.8  PLT 211.0 216   Coagulation:   Recent Labs Lab 02/12/14 1351  LABPROT 12.5  INR 0.95   Cardiac Enzymes: No results found for this basename: CKTOTAL, CKMB, CKMBINDEX, TROPONINI,  in the last 168 hours Urinalysis: No results found for this basename: COLORURINE, APPERANCEUR, LABSPEC, PHURINE, GLUCOSEU, HGBUR, BILIRUBINUR, KETONESUR, PROTEINUR, UROBILINOGEN, NITRITE, LEUKOCYTESUR,  in the last 168 hours Lipid Panel    Component Value Date/Time   CHOL 162 02/13/2014 0433   TRIG 462* 02/13/2014 0433   HDL 29* 02/13/2014 0433   CHOLHDL 5.6 02/13/2014 0433   VLDL UNABLE TO CALCULATE IF TRIGLYCERIDE OVER 400 mg/dL 02/13/2014 0433   LDLCALC UNABLE TO CALCULATE IF TRIGLYCERIDE OVER 400 mg/dL 02/13/2014 0433   HgbA1C  Lab Results  Component Value Date   HGBA1C  9.1* 01/24/2014    Urine Drug Screen:   No results found for this basename: labopia,  cocainscrnur,  labbenz,  amphetmu,  thcu,  labbarb    Alcohol Level:   Recent Labs Lab 02/12/14 1958  ETH <11    CT of the brain  02/12/2014    Study within normal limits. No demonstrable acute infarct. No hemorrhage or mass effect.    MRI of the brain    MRA of the brain    2D Echocardiogram  Hypokinesis base inferior and base inferolateral walls. Overall EF seems better than 01/29/2014 study. Now 45%. Systolic function was normal. No cardiac source of embolism was identified, but cannot be ruled   Carotid Doppler  No evidence of hemodynamically significant internal carotid artery stenosis. Vertebral artery flow is antegrade.   CXR  02/12/2014   No acute cardiopulmonary disease.     EKG   normal sinus rhythm, prolonged QT. For complete results please see formal report.   Therapy Recommendations   Physical Exam   Middle aged caucasian lady not in distress.Awake alert. Afebrile. Head is nontraumatic. Neck is supple without bruit. Hearing is normal. Cardiac exam no murmur or gallop. Lungs are clear to auscultation. Distal pulses are well felt. Neurological Exam ;  Awake  Alert oriented x 3. Normal speech and language.eye movements full without nystagmus.fundi were not visualized. Vision acuity and fields appear normal. Hearing is normal. Palatal movements are normal. Face symmetric. Tongue midline. Normal strength, tone, reflexes and coordination. But effort is poor and variable Normal sensation. Gait deferred. ASSESSMENT [name] is a 48 y.o. female presenting with diplopia (one on top of the other) a few hours post cardiac cath.  Imaging pending. Suspect brainstem stroke. Etiology to be determined. On aspirin 325 mg orally every day prior to admission. Now on aspirin 325 mg orally every day for secondary stroke prevention. Patient with resultant diplopia (one on top of the other). Work up underway.  hypertension   Hyperlipidemia, LDL unable to calculate, on no statin PTA, now on Lipitor 40 mg daily, goal LDL < 70 for diabetics  Diabetes, uncontrolled, Hemoglobin A1c 9.1 in January 2015 obstructive sleep apnea Bipolar disorder Methicillin-resistant staph aureus COPD Tobacco abuse   Hospital day # 1  TREATMENT/PLAN  Continue aspirin 325 mg orally every day for secondary stroke prevention.  Added MRA to MRI of the brain already ordered  Therapy evaluations  Burnetta Sabin, MSN, RN, ANVP-BC, ANP-BC, GNP-BC [facility] Pager: [phone] 02/13/2014 9:30 AM  I have personally obtained a history, examined the patient, evaluated imaging results, and formulated the assessment and plan of care. I agree with the above. Antony Contras, MD

## 2014-02-13 NOTE — Progress Notes (Signed)
*  PRELIMINARY RESULTS* Vascular Ultrasound Carotid Duplex (Doppler) has been completed.  Preliminary findings: Bilateral: 1-39% ICA stenosis. Vertebral artery flow is antegrade.  Diamond Nickel 02/13/2014, 10:36 AM

## 2014-02-13 NOTE — Progress Notes (Signed)
[facility] PROGRESS NOTE   [name] [id number] DOB: 05-Aug-1966 DOA: 02/12/2014 PCP: Tamsen Roers, MD  HPI/Subjective: Patient is confused this morning, pulled her insulin pump.  Assessment/Plan: Principal Problem:   CVA (cerebral infarction) Active Problems:   Unspecified essential hypertension   Type II or unspecified type diabetes mellitus with neurological manifestations, uncontrolled   Polyneuropathy in diabetes(357.2)   Hyperlipemia   Abnormal stress test   Acute CVA -This happened after cardiac catheterization, neurology consulted. -Patient has diplopia, CT scan is negative. -Pursue aggressive for stroke workup including MRI/MRA of the brain. -2-D echo, carotid Dopplers, patient is on telemetry. -Started on aspirin 325. Waiting for neurology recommendation.  Confusion -Unclear to me why she is confused, likely secondary to medications versus primary CNS event. -Discontinue benzodiazepines and narcotics.  Hypertension -Stable, continue current medications.  Diabetes mellitus type 2 uncontrolled -Patient using insulin pump with basal rate of 1.67, started on Lantus insulin. -Hemoglobin A1c was 9.1 and January 30.  Hyperlipidemia -Hyperlipidemia associated with uncontrolled hypertension with hypertriglyceridemia and high LDL. -Control blood sugars and continue statins.  Abnormal stress test -Cardiac catheterization was normal.  Code Status: Full code Family Communication: Plan discussed with the patient. Disposition Plan: Remains inpatient   Consultants:  Stroke M.D.  Procedures:  Cardiac catheterization done on 2/18 by Dr. Marlou Porch.  Antibiotics:  None   Objective: Filed Vitals:   02/13/14 0600  BP: 141/79  Pulse: 94  Temp: 97.9 F (36.6 C)  Resp: 20   No intake or output data in the 24 hours ending 02/13/14 1138 Filed Weights   02/12/14 0623  Weight: 83.008 kg (183 lb)    Exam: General: Alert and awake, oriented x3,  not in any acute distress. HEENT: anicteric sclera, pupils reactive to light and accommodation, EOMI CVS: S1-S2 clear, no murmur rubs or gallops Chest: clear to auscultation bilaterally, no wheezing, rales or rhonchi Abdomen: soft nontender, nondistended, normal bowel sounds, no organomegaly Extremities: no cyanosis, clubbing or edema noted bilaterally Neuro: Cranial nerves II-XII intact, no focal neurological deficits  Data Reviewed: Basic Metabolic Panel:  Recent Labs Lab 02/12/14 0630 02/12/14 1351  NA  --  142  K 3.0* 3.9  CL  --  104  CO2  --  27  GLUCOSE  --  153*  BUN  --  6  CREATININE  --  0.63  CALCIUM  --  8.7   Liver Function Tests:  Recent Labs Lab 02/12/14 1351  AST 12  ALT 10  ALKPHOS 68  BILITOT <0.2*  PROT 5.8*  ALBUMIN 2.7*   No results found for this basename: LIPASE, AMYLASE,  in the last 168 hours No results found for this basename: AMMONIA,  in the last 168 hours CBC:  Recent Labs Lab 02/10/14 1142 02/12/14 1351  WBC 13.1* 8.9  NEUTROABS 9.6*  --   HGB 12.6 12.1  HCT 38.3 34.7*  MCV 94.4 90.8  PLT 211.0 216   Cardiac Enzymes: No results found for this basename: CKTOTAL, CKMB, CKMBINDEX, TROPONINI,  in the last 168 hours BNP (last 3 results) No results found for this basename: PROBNP,  in the last 8760 hours CBG:  Recent Labs Lab 02/12/14 1359 02/12/14 1722 02/12/14 2131 02/13/14 0148 02/13/14 0708  GLUCAP 146* 211* 178* 153* 157*    Micro Recent Results (from the past 240 hour(s))  MRSA PCR SCREENING     Status: None   Collection Time    02/12/14  5:58 PM      Result  Value Ref Range Status   MRSA by PCR NEGATIVE  NEGATIVE Final   Comment:            The GeneXpert MRSA Assay (FDA     approved for NASAL specimens     only), is one component of a     comprehensive MRSA colonization     surveillance program. It is not     intended to diagnose MRSA     infection nor to guide or     monitor treatment for     MRSA  infections.     Studies: Dg Chest 2 View  02/12/2014   CLINICAL DATA:  Stroke during heart cath.  EXAM: CHEST - 2 VIEW  COMPARISON:  01/14/2014  FINDINGS: Cervical fixation hardware partially seen. Lungs clear. Heart size normal. . No effusion. Visualized skeletal structures are unremarkable.  IMPRESSION: No acute cardiopulmonary disease.   Electronically Signed   By: Arne Cleveland M.D.   On: 02/12/2014 21:17   Ct Head Wo Contrast  02/12/2014   CLINICAL DATA:  Dizziness and ophthalmoplegia  EXAM: CT HEAD WITHOUT CONTRAST  TECHNIQUE: Contiguous axial images were obtained from the base of the skull through the vertex without intravenous contrast.  COMPARISON:  None.  FINDINGS: Ventricles are normal in size and configuration. There is no mass, hemorrhage, extra-axial fluid collection, or midline shift. No focal gray-white compartment lesions are identified. There is no demonstrable acute infarct. Bony calvarium appears intact. The mastoid air cells are clear.  IMPRESSION: Study within normal limits. No demonstrable acute infarct. No hemorrhage or mass effect.  Critical Value/emergent results were called by telephone at the time of interpretation on 02/12/2014 at 1:53 PM to Dr. Roland Rack , who verbally acknowledged these results.   Electronically Signed   By: Lowella Grip M.D.   On: 02/12/2014 13:53    Scheduled Meds: . amitriptyline  150 mg Oral QHS  . amoxicillin-clavulanate  1 tablet Oral BID  . atorvastatin  40 mg Oral q1800  . Canagliflozin  100 mg Oral Daily  . chlorproMAZINE  100 mg Oral QHS  . diazepam  10 mg Oral QHS  . heparin  5,000 Units Subcutaneous 3 times per day  . insulin aspart  0-15 Units Subcutaneous TID WC  . insulin glargine  35 Units Subcutaneous Daily  . insulin pump   Subcutaneous TID AC, HS, 0200  . nicotine  21 mg Transdermal Q24H  . pneumococcal 23 valent vaccine  0.5 mL Intramuscular Tomorrow-1000  . ramipril  5 mg Oral Daily   Continuous  Infusions: . sodium chloride 1 mL/kg/hr (02/12/14 1751)       Time spent: 35 minutes    [facility]  [facility] Pager [phone] If 7PM-7AM, please contact night-coverage at [email or web address], password [facility] 02/13/2014, 11:38 AM  LOS: 1 day

## 2014-02-13 NOTE — Progress Notes (Signed)
PT/OT Cancellation Note  Patient Details Name: [name] MRN: [id number] DOB: 1966/08/08   Cancelled Treatment:    Reason Eval/Treat Not Completed: Patient at procedure or test/unavailable (at MRI).  PT/OT both stopping by to attempt to see pt. She is at MRI.  We will check back later.    Thanks,    Barbarann Ehlers. [place], [place], DPT [phone]   02/13/2014, 11:21 AM

## 2014-02-13 NOTE — Progress Notes (Signed)
Courtesy visit  Had MRI earlier this morning. Small embolic strokes.  Diplopia present. Left eye.  Currently sitting with her family. One flew in from [place].  Starting physical therapy. She is eager to go home as soon as it is safe. Her fianc/boyfriend is now out of the hospital.  She seems clear, fluent currently. Previously was sedate according to prior note.  Lengthy discussion with her and her family. Appreciate continued input with neurology, hospitalist.

## 2014-02-13 NOTE — Evaluation (Signed)
Physical Therapy Evaluation Patient Details Name: [name] MRN: [id number] DOB: [birth year] Today's Date: 02/13/2014 Time: [id number] PT Time Calculation (min): 25 min  PT Assessment / Plan / Recommendation History of Present Illness  48 y.o. female admitted to [facility] on 02/12/14 with history of systolic heart failure with an EF of 30-35% on 01/29/2013, diabetes insulin-dependent currently on an insulin pump with the last hemoglobin A1c of 9.1 back in January 2015, peripheral neuropathy due to diabetes, hypertension, alcohol abuse daily had a cath date of admission. Following the procedure was in her normal state. Around 10am, she took a nap and on awakening had diploplia. Code stroke was called neurology evaluate her and deemed to not candidate for TPA.  MRI revealed Multiple small areas of acute infarct involving the right occipital lobe, right parietal lobe, right frontal lobe, and left posterior frontal lobe. Findings are consistent with emboli. No large territory infarct identified.   Clinical Impression  Pt is very unsteady on her feet and her most significant deficit is her left sided vision.  There is a very mild decrease in her left leg strength and possibly in the coordination on the left leg (as observed during gait-functionally).  She would ben an excellent inpatient rehab candidate due to her high risk of falls and instability with walking and the need for PT/OT, but she is adamantly refusing and wants to go home with her boyfriend (who just moved here and was discharged from [place] today due to MI- per pt report).   PT to follow acutely for deficits listed below.       PT Assessment  Patient needs continued PT services    Follow Up Recommendations  CIR;Other (comment) (pt refusing CIR, so max HH services)    Does the patient have the potential to tolerate intense rehabilitation     Yes  Barriers to Discharge   None      Equipment Recommendations  Rolling walker with 5" wheels     Recommendations for Other Services   None  Frequency Min 4X/week    Precautions / Restrictions Precautions Precautions: Fall Precaution Comments: pt unsteady on her feet   Pertinent Vitals/Pain Reports HA (never rated) RN aware and getting order from MD for meds.        Mobility  Bed Mobility Overal bed mobility: Modified Independent General bed mobility comments: HOB elevated and railing for leverage.  Transfers Overall transfer level: Needs assistance Equipment used: Rolling walker (2 wheeled) Transfers: Sit to/from Stand Sit to Stand: Min assist General transfer comment: Min assist to get to standing to support trunk for balance.  Assist needed to help pt find the handles of the RW with bil hands.  Ambulation/Gait Ambulation/Gait assistance: Min assist;Mod assist Ambulation Distance (Feet): 150 Feet Assistive device: Rolling walker (2 wheeled) Gait Pattern/deviations: Step-through pattern;Staggering left;Staggering right;Ataxic;Steppage Gait velocity: decreased Gait velocity interpretation: Below normal speed for age/gender General Gait Details: Verbal cues to slow down gait speed, close left eye to help with visual deficits and focus on her walking (she would look up and be distracted and then lose her balance).  Pt with staggering gait pattern and either ataxia vs steppage pattern on the left (versus a combo of peripherial neuropathy and vision deficits).  She is at very high risk for falls both with and without RW during gait.  Modified Rankin (Stroke Patients Only) Pre-Morbid Rankin Score: No symptoms Modified Rankin: Moderately severe disability        PT Diagnosis: Difficulty walking;Abnormality of  gait;Hemiplegia non-dominant side;Altered mental status  PT Problem List: Decreased strength;Decreased activity tolerance;Decreased balance;Decreased mobility;Decreased coordination;Decreased knowledge of use of DME;Decreased safety awareness;Impaired sensation PT Treatment  Interventions: DME instruction;Gait training;Stair training;Functional mobility training;Therapeutic activities;Balance training;Therapeutic exercise;Neuromuscular re-education;Cognitive remediation;Patient/family education     PT Goals(Current goals can be found in the care plan section) Acute Rehab PT Goals Patient Stated Goal: to go home ASAP with her boyfriend PT Goal Formulation: With patient Time For Goal Achievement: 02/27/14 Potential to Achieve Goals: Good  Visit Information  Last PT Received On: 02/13/14 Assistance Needed: +1 Reason Eval/Treat Not Completed: Patient at procedure or test/unavailable (at MRI) History of Present Illness: 47 y.o. female admitted to [facility] on 02/12/14 with history of systolic heart failure with an EF of 30-35% on 01/29/2013, diabetes insulin-dependent currently on an insulin pump with the last hemoglobin A1c of 9.1 back in January 2015, peripheral neuropathy due to diabetes, hypertension, alcohol abuse daily had a cath date of admission. Following the procedure was in her normal state. Around 10am, she took a nap and on awakening had diploplia. Code stroke was called neurology evaluate her and deemed to not candidate for TPA.  MRI revealed Multiple small areas of acute infarct involving the right occipital lobe, right parietal lobe, right frontal lobe, and left posterior frontal lobe. Findings are consistent with emboli. No large territory infarct identified.        Prior [place] expects to be discharged to:: Private residence Living Arrangements: Spouse/significant other (boyfriend) Available Help at Discharge: Other (Comment) (boyfriend) Type of Home: House Home Access: Stairs to enter [employer] of Steps: 2 Entrance Stairs-Rails: None Home Layout: One level Home Equipment: Walker - standard Additional Comments: h/o back and neck surgery, bil foot surgery, bil shoulder surgery, R hand surgery, left knee  multiple surgeries.  Prior Function Level of Independence: Independent Comments: drives, doesn't wear glasses, h/o Peripherial neuropathy due to diabetes Communication Communication: No difficulties Dominant Hand: Right    Cognition  Cognition Arousal/Alertness: Awake/alert Behavior During Therapy: Impulsive Overall Cognitive Status: Impaired/Different from baseline Area of Impairment: Safety/judgement;Awareness Safety/Judgement: Decreased awareness of safety Awareness: Emergent General Comments: Pt is aware of her deficits, but is unable to make safe decisions as she is moving about the room or walking with therapy down the hallway in reguards to her balance and safety when she is up on her feet.      Extremity/Trunk Assessment Upper Extremity Assessment Upper Extremity Assessment: Defer to OT evaluation Lower Extremity Assessment Lower Extremity Assessment: LLE deficits/detail LLE Deficits / Details: very mild L leg weakness 4-4+/5 throughout left leg.  Pt with significant h/o of orthopedic issues and this may be baseline, but is mild.  During gait seemed to have decreased coordination on left leg with over stepping, but not sure that visual deficits combined with h/o peripherial neuropathy could possible explain this.  LLE Sensation: history of peripheral neuropathy LLE Coordination: decreased gross motor Cervical / Trunk Assessment Cervical / Trunk Assessment: Other exceptions Cervical / Trunk Exceptions: h/o cervical and lumbar surgeries.    Balance Balance Overall balance assessment: Needs assistance Sitting-balance support: Feet supported;Single extremity supported Sitting balance-Leahy Scale: Fair Sitting balance - Comments: With both eyes open pt with increased sway at her trunk, but better when closing her left eye.  She does have one hand on the bed for support and to help her know where she is in space.  Standing balance support: Bilateral upper extremity supported;Single  extremity supported Standing balance-Leahy Scale:  Poor Standing balance comment: Pt in static standing with increased sway even with right eye open and left eye closed.    End of Session PT - End of Session Equipment Utilized During Treatment: Gait belt Activity Tolerance: Patient tolerated treatment well Patient left: in bed;with call bell/phone within reach;with family/visitor present (seated EOB) Nurse Communication: Mobility status;Other (comment) (pt wants a coke (regular))    [name] [name], [place], DPT [phone]   02/13/2014, 1:52 PM

## 2014-02-13 NOTE — Progress Notes (Signed)
Occupational Therapy Treatment Patient Details Name: [name] MRN: [id number] DOB: August 25, 1966 Today's Date: 02/13/2014 Time: [id number] OT Time Calculation (min): 12 min  OT Assessment / Plan / Recommendation  History of present illness 48 y.o. female admitted to [facility] on 02/12/14 with history of systolic heart failure with an EF of 30-35% on 01/29/2013, diabetes insulin-dependent currently on an insulin pump with the last hemoglobin A1c of 9.1 back in January 2015, peripheral neuropathy due to diabetes, hypertension, alcohol abuse daily had a cath date of admission. Following the procedure was in her normal state. Around 10am, she took a nap and on awakening had diploplia. Code stroke was called neurology evaluate her and deemed to not candidate for TPA.  MRI revealed Multiple small areas of acute infarct involving the right occipital lobe, right parietal lobe, right frontal lobe, and left posterior frontal lobe. Findings are consistent with emboli. No large territory infarct identified.    OT comments  Educated pt on use of patch. Due to significant nystagmus, patch appears to work well at this time. Instructed pt to alternate the patch q 2 hours in order to attempt to strengthen L eye. However, when ambulating, pt needs to wear patch on L eye. Educated son also. Began gaze stabilization exercises. Will follow tomorrow, Feel safest plan is to d/C to CIR.   Follow Up Recommendations  CIR;Supervision/Assistance - 24 hour    Barriers to Discharge  Other (comment) (states bf can stay with her)    Equipment Recommendations  3 in 1 bedside comode;Tub/shower bench    Recommendations for Other Services Rehab consult  Frequency Min 3X/week   Progress towards OT Goals Progress towards OT goals: Progressing toward goals  Plan Discharge plan remains appropriate    Precautions / Restrictions Precautions Precautions: Fall Precaution Comments: pt unsteady on her feet   Pertinent Vitals/Pain  Headache - nsg notified    ADL  Eating/Feeding: Set up;Supervision/safety Where Assessed - Eating/Feeding: Edge of bed Grooming: Minimal assistance Where Assessed - Grooming: Supported sitting Upper Body Bathing: Minimal assistance Where Assessed - Upper Body Bathing: Supported sitting Lower Body Bathing: Moderate assistance Where Assessed - Lower Body Bathing: Supported sit to stand Upper Body Dressing: Minimal assistance Where Assessed - Upper Body Dressing: Supported sitting Lower Body Dressing: Moderate assistance Where Assessed - Lower Body Dressing: Supported sit to Lobbyist: Moderate assistance Toilet Transfer Method: Sit to stand;Stand pivot Toileting - Clothing Manipulation and Hygiene: Minimal assistance Where Assessed - Toileting Clothing Manipulation and Hygiene: Sit to stand from 3-in-1 or toilet Equipment Used: Gait belt Transfers/Ambulation Related to ADLs: mod A ADL Comments: Pt with eye patich. reviewed need o alternate q 2 hours as she is able to toelrate. Pt needs to wear on L eye whenever ambulating.    OT Diagnosis: Generalized weakness;Cognitive deficits;Disturbance of vision;Ataxia  OT Problem List: Decreased strength;Decreased activity tolerance;Impaired balance (sitting and/or standing);Impaired vision/perception;Decreased coordination;Decreased cognition;Decreased knowledge of use of DME or AE;Decreased knowledge of precautions;Impaired sensation;Obesity;Impaired UE functional use;Pain OT Treatment Interventions: Self-care/ADL training;Therapeutic exercise;Neuromuscular education;Energy conservation;DME and/or AE instruction;Therapeutic activities;Cognitive remediation/compensation;Visual/perceptual remediation/compensation;Patient/family education;Balance training   OT Goals(current goals can now be found in the care plan section) Acute Rehab OT Goals Patient Stated Goal: to go home ASAP with her boyfriend OT Goal Formulation: With patient Time  For Goal Achievement: 02/27/14 Potential to Achieve Goals: Good ADL Goals Pt Will Perform Eating: with set-up;sitting Pt Will Perform Grooming: with set-up;sitting Pt Will Perform Upper Body Bathing: with set-up;sitting Pt/caregiver will Perform Home Exercise  Program: With Supervision;With written HEP provided;Left upper extremity;With theraputty Additional ADL Goal #1: Pt will achieve foveation using patch to increase funciton Additional ADL Goal #2: Pt will complete visual fixation ex to decrease diplopia and increase function  Visit Information  Last OT Received On: 02/13/14 Assistance Needed: +1 History of Present Illness: 48 y.o. female admitted to [facility] on 02/12/14 with history of systolic heart failure with an EF of 30-35% on 01/29/2013, diabetes insulin-dependent currently on an insulin pump with the last hemoglobin A1c of 9.1 back in January 2015, peripheral neuropathy due to diabetes, hypertension, alcohol abuse daily had a cath date of admission. Following the procedure was in her normal state. Around 10am, she took a nap and on awakening had diploplia. Code stroke was called neurology evaluate her and deemed to not candidate for TPA.  MRI revealed Multiple small areas of acute infarct involving the right occipital lobe, right parietal lobe, right frontal lobe, and left posterior frontal lobe. Findings are consistent with emboli. No large territory infarct identified.     Subjective Data      Prior Functioning  Home Living Family/patient expects to be discharged to:: Private residence Living Arrangements: Spouse/significant other (boyfriend) Available Help at Discharge: Other (Comment) (boyfriend) Type of Home: House Home Access: Stairs to enter [employer] of Steps: 2 Entrance Stairs-Rails: None Home Layout: One level Home Equipment: Walker - standard Additional Comments: h/o back and neck surgery, bil foot surgery, bil shoulder surgery, R hand surgery, left knee  multiple surgeries.  Prior Function Level of Independence: Independent Comments: drives, doesn't wear glasses, h/o Peripherial neuropathy due to diabetes Communication Communication: No difficulties Dominant Hand: Right    Cognition  Cognition Arousal/Alertness: Awake/alert Behavior During Therapy: Impulsive Overall Cognitive Status: Impaired/Different from baseline Area of Impairment: Safety/judgement;Awareness Safety/Judgement: Decreased awareness of safety Awareness: Emergent General Comments: decreased insight    Mobility  Bed Mobility Overal bed mobility: Modified Independent General bed mobility comments: HOB elevated and railing for leverage.  Transfers Overall transfer level: Needs assistance Equipment used: Rolling walker (2 wheeled) Sit to Stand: Min assist General transfer comment: Min assist to get to standing to support trunk for balance.  Assist needed to help pt find the handles of the RW with bil hands.     Exercises  Other Exercises Other Exercises: gaze stabilization Other Exercises:  patch schedule   Balance Balance Overall balance assessment: Needs assistance Sitting-balance support: Feet supported;No upper extremity supported Sitting balance-Leahy Scale: Fair Sitting balance - Comments: With both eyes open pt with increased sway at her trunk, but better when closing her left eye.  She does have one hand on the bed for support and to help her know where she is in space.  Standing balance support: Bilateral upper extremity supported;During functional activity Standing balance-Leahy Scale: Poor Standing balance comment: sway  End of Session OT - End of Session Equipment Utilized During Treatment: Gait belt Activity Tolerance: Patient tolerated treatment well Patient left: in bed;with call bell/phone within reach;with family/visitor present Nurse Communication: Mobility status;Other (comment) (patch scheduel)  GO     Charizma Gardiner,H[place] 02/13/2014, 5:10  PM [facility], OTR/L  [phone] 02/13/2014

## 2014-02-13 NOTE — Progress Notes (Signed)
Occupational Therapy Evaluation Patient Details Name: [name] MRN: [id number] DOB: August 19, 1966 Today's Date: 02/13/2014 Time: [id number] OT Time Calculation (min): 48 min  OT Assessment / Plan / Recommendation History of present illness 48 y.o. female admitted to [facility] on 02/12/14 with history of systolic heart failure with an EF of 30-35% on 01/29/2013, diabetes insulin-dependent currently on an insulin pump with the last hemoglobin A1c of 9.1 back in January 2015, peripheral neuropathy due to diabetes, hypertension, alcohol abuse daily had a cath date of admission. Following the procedure was in her normal state. Around 10am, she took a nap and on awakening had diploplia. Code stroke was called neurology evaluate her and deemed to not candidate for TPA.  MRI revealed Multiple small areas of acute infarct involving the right occipital lobe, right parietal lobe, right frontal lobe, and left posterior frontal lobe. Findings are consistent with emboli. No large territory infarct identified.    Clinical Impression   PTA, pt independent wit ADL and mobility. Pt on disability. Pt with apparent R occipital, parietal and frontal lobe infarct, in addition to L posterior frontal infarct. Appear embolic. Pt presents with LUE generalized weakness, ataxia, and significant visual deficits. PT with decreased horizontal and vertical gaze control. Decreased L eye adduction and R eye nystagmus with abduction. Pt reports images on top of each other or diagonal and that objects move around. At this time rec CIR for rehab. Pt insistent on going home. IF pt refuses CIR, will need 24/7 S and HHOT. Pt will benefit from skilled OT services to facilitate D/C to next venue due to below deficits.    OT Assessment  Patient needs continued OT Services    Follow Up Recommendations  CIR;Supervision/Assistance - 24 hour    Barriers to Discharge Other (comment) (states bf can stay with her)    Equipment Recommendations  3 in  1 bedside comode;Tub/shower bench    Recommendations for Other Services Rehab consult  Frequency  Min 3X/week    Precautions / Restrictions Precautions Precautions: Fall Precaution Comments: pt unsteady on her feet   Pertinent Vitals/Pain no apparent distress     ADL  Eating/Feeding: Set up;Supervision/safety Where Assessed - Eating/Feeding: Edge of bed Grooming: Minimal assistance Where Assessed - Grooming: Supported sitting Upper Body Bathing: Minimal assistance Where Assessed - Upper Body Bathing: Supported sitting Lower Body Bathing: Moderate assistance Where Assessed - Lower Body Bathing: Supported sit to stand Upper Body Dressing: Minimal assistance Where Assessed - Upper Body Dressing: Supported sitting Lower Body Dressing: Moderate assistance Where Assessed - Lower Body Dressing: Supported sit to Lobbyist: Moderate assistance Toilet Transfer Method: Sit to stand;Stand pivot Toileting - Clothing Manipulation and Hygiene: Minimal assistance Where Assessed - Toileting Clothing Manipulation and Hygiene: Sit to stand from 3-in-1 or toilet Equipment Used: Gait belt Transfers/Ambulation Related to ADLs: mod A ADL Comments: significantly impacted by vision    OT Diagnosis: Generalized weakness;Cognitive deficits;Disturbance of vision;Ataxia  OT Problem List: Decreased strength;Decreased activity tolerance;Impaired balance (sitting and/or standing);Impaired vision/perception;Decreased coordination;Decreased cognition;Decreased knowledge of use of DME or AE;Decreased knowledge of precautions;Impaired sensation;Obesity;Impaired UE functional use;Pain OT Treatment Interventions: Self-care/ADL training;Therapeutic exercise;Neuromuscular education;Energy conservation;DME and/or AE instruction;Therapeutic activities;Cognitive remediation/compensation;Visual/perceptual remediation/compensation;Patient/family education;Balance training   OT Goals(Current goals can be found in  the care plan section) Acute Rehab OT Goals Patient Stated Goal: to go home ASAP with her boyfriend OT Goal Formulation: With patient Time For Goal Achievement: 02/27/14 Potential to Achieve Goals: Good  Visit Information  Last OT Received On: 02/13/14  Assistance Needed: +1 History of Present Illness: 48 y.o. female admitted to [facility] on 02/12/14 with history of systolic heart failure with an EF of 30-35% on 01/29/2013, diabetes insulin-dependent currently on an insulin pump with the last hemoglobin A1c of 9.1 back in January 2015, peripheral neuropathy due to diabetes, hypertension, alcohol abuse daily had a cath date of admission. Following the procedure was in her normal state. Around 10am, she took a nap and on awakening had diploplia. Code stroke was called neurology evaluate her and deemed to not candidate for TPA.  MRI revealed Multiple small areas of acute infarct involving the right occipital lobe, right parietal lobe, right frontal lobe, and left posterior frontal lobe. Findings are consistent with emboli. No large territory infarct identified.        Prior [place] expects to be discharged to:: Private residence Living Arrangements: Spouse/significant other (boyfriend) Available Help at Discharge: Other (Comment) (boyfriend) Type of Home: House Home Access: Stairs to enter [employer] of Steps: 2 Entrance Stairs-Rails: None Home Layout: One level Home Equipment: Walker - standard Additional Comments: h/o back and neck surgery, bil foot surgery, bil shoulder surgery, R hand surgery, left knee multiple surgeries.  Prior Function Level of Independence: Independent Comments: drives, doesn't wear glasses, h/o Peripherial neuropathy due to diabetes Communication Communication: No difficulties Dominant Hand: Right         Vision/Perception Vision - History Baseline Vision: No visual deficits Patient Visual Report: Blurring of  vision;Diplopia Vision - Assessment Eye Alignment: Impaired (comment) Vision Assessment: Vision tested Ocular Range of Motion: Restricted on the left;Restricted looking up;Restricted looking down;Restricted on the right (restricted L eye) Alignment/Gaze Preference: Head tilt Tracking/Visual Pursuits: Left eye does not track medially;Decreased smoothness of horizontal tracking;Decreased smoothness of vertical tracking;Unable to hold eye position out of midline Saccades: Additional head turns occurred during testing;Decreased speed of saccadic movement;Overshoots Convergence: Impaired (comment) Visual Fields: Left visual field deficit (decreased L field) Diplopia Assessment: Objects split on top of one another;Present all the time/all directions;Disappears with one eye closed;Objects split side to side Depth Perception: unable to hit target with hand Additional Comments: pt c/o objects moving around. Pt wit R eye nystagmus in R lateral gaze Perception Perception: Within Functional Limits Praxis Praxis: Intact   Cognition  Cognition Arousal/Alertness: Awake/alert Behavior During Therapy: Impulsive Overall Cognitive Status: Impaired/Different from baseline Area of Impairment: Safety/judgement;Awareness Safety/Judgement: Decreased awareness of safety Awareness: Emergent General Comments: Pt is aware of her deficits, but is unable to make safe decisions as she is moving about the room or walking with therapy down the hallway in reguards to her balance and safety when she is up on her feet.      Extremity/Trunk Assessment Upper Extremity Assessment Upper Extremity Assessment: LUE deficits/detail;RUE deficits/detail RUE Sensation: history of peripheral neuropathy LUE Deficits / Details: ataxic, general weakness LUE Sensation: decreased light touch LUE Coordination: decreased fine motor;decreased gross motor Lower Extremity Assessment Lower Extremity Assessment: LLE deficits/detail LLE  Deficits / Details: very mild L leg weakness 4-4+/5 throughout left leg.  Pt with significant h/o of orthopedic issues and this may be baseline, but is mild.  During gait seemed to have decreased coordination on left leg with over stepping, but not sure that visual deficits combined with h/o peripherial neuropathy could possible explain this.  LLE Sensation: history of peripheral neuropathy;decreased light touch LLE Coordination: decreased gross motor Cervical / Trunk Assessment Cervical / Trunk Assessment: Other exceptions Cervical / Trunk Exceptions: h/o cervical  and lumbar surgeries.      Mobility Bed Mobility Overal bed mobility: Modified Independent General bed mobility comments: HOB elevated and railing for leverage.  Transfers Overall transfer level: Needs assistance Equipment used: Rolling walker (2 wheeled) Transfers: Sit to/from Stand Sit to Stand: Min assist General transfer comment: Min assist to get to standing to support trunk for balance.  Assist needed to help pt find the handles of the RW with bil hands.      Exercise Other Exercises Other Exercises: visual fixation ex with individual eyes Other Exercises: discussed use of eye patch   Balance Balance Overall balance assessment: Needs assistance Sitting-balance support: Feet supported;No upper extremity supported Sitting balance-Leahy Scale: Fair Sitting balance - Comments: With both eyes open pt with increased sway at her trunk, but better when closing her left eye.  She does have one hand on the bed for support and to help her know where she is in space.  Standing balance support: Bilateral upper extremity supported;During functional activity Standing balance-Leahy Scale: Poor Standing balance comment: sway   End of Session OT - End of Session Equipment Utilized During Treatment: Gait belt Activity Tolerance: Patient tolerated treatment well Patient left: in bed;with call bell/phone within reach;with bed alarm  set Nurse Communication: Mobility status;Other (comment) (need for eye patch)  GO     Dalina Samara,HILLARY 02/13/2014, 3:52 PM [facility] OTR/L  [phone] 02/13/2014

## 2014-02-13 NOTE — Progress Notes (Addendum)
Inpatient Diabetes Program Recommendations  AACE/ADA: New Consensus Statement on Inpatient Glycemic Control (2013)  Target Ranges:  Prepandial:   less than 140 mg/dL      Peak postprandial:   less than 180 mg/dL (1-2 hours)      Critically ill patients:  140 - 180 mg/dL     Results for [name], [name] (MRN [id number]) as of 02/13/2014 08:07  Ref. Range 02/12/2014 06:29 02/12/2014 07:32 02/12/2014 08:09 02/12/2014 08:32 02/12/2014 13:10 02/12/2014 13:59 02/12/2014 17:22 02/12/2014 21:31  Glucose-Capillary Latest Range: 70-99 mg/dL 77 67 (L) 55 (L) 98 139 (H) 146 (H) 211 (H) 178 (H)    Results for [name], [name] (MRN [id number]) as of 02/13/2014 08:07  Ref. Range 02/13/2014 01:48 02/13/2014 07:08  Glucose-Capillary Latest Range: 70-99 mg/dL 153 (H) 157 (H)    **Admitted for cardiac cath.  Post-procedure patient developed diplopia and Code Stroke was called.  **Patient now on 3W.  Using insulin pump (V-GO 40 pump).  V-GO 40 pump delivers 1.67 units/hr as a basal rate (40 units insulin per 24 hour period).  When patient eats, she will bolus her food with her pump.  One "click" delivers 2 units of Novolog.  **Patient was seen by her Endocrinologist (Dr. Dwyane Dee with [facility]) on 02/04/14.  Patient was given instructions by Dr. Dwyane Dee to change her V-GO pump to the V-GO 40 (patient was using the V-GO 30 before her visit with Dr. Dwyane Dee), to reduce her regular soft drink consumption, and to check her CBGs more frequently.  Patient also had a visit with [name] (Certified DM Educator ) on 02/04/14 as well.  Received further pump training, received further fingerstick glucose training, and was given a new CBG meter as well.  **Patient will need to change her V-GO insulin pump Q24 hours per manufacturer instructions    Will follow. Wyn Quaker RN, MSN, CDE Diabetes Coordinator Inpatient Diabetes Program Team Pager: [phone] (8a-10p)   1057am Addendum:  Called V-GO insulin  pump manufacturer this morning.  Patient should not wear the V-GO pump into the MRI.  Spoke with patient this morning about her V-GO insulin pump.  Patient was very confused and extremely sleepy.  She attempted to get out of bed and was very wobbly on her feet.  Assisted patient to and from the bathroom.  Made sure patient was safely back in bed and turned on bed alarm.  Instructed patient to not get out of bed without assistance.    Patient is not appropriate for insulin pump therapy at present.  During our conversation, patient pulled off her pump and threw it in the trash even though I asked her not to.  Patient was saying non-sensical things this morning and could barely keep her eyes open during our conversation.  Instructed patient that we will begin giving her insulin injections and that she should not put her V-GO insulin  pump back on.  Patient became beligerant and told me she would not stay the night and would not let us give her injections.  Explained to patient that we need to give her injections of insulin since she pulled her pump off and since she can't wear it to MRI.  Patient calmed down and told me she would allow insulin shots just as long as she gets to go home soon.  Spoke with MD about patient.  Got MD to place orders for Lantus 35 units daily + Novolog Moderate SSI.  Patient is not to put insulin pump  back on until she is A&O.  Patient will also need to wait 24 hours after last Lantus dose given before she resumes her V-GO pump so that the basal rates on the pump and the Lantus insulin do not overlap and potentially cause hypoglycemia.  Spoke with [name], RN caring for patient.  Asked RN to please make sure patient get her Lantus insulin as soon as it arrives from pharmacy.  Also asked RN to please make sure patient does not attempt to restart her V-GO insulin pump yet.  Will follow. Wyn Quaker RN, MSN, CDE Diabetes Coordinator Inpatient Diabetes Program Team Pager:  [phone] (8a-10p)

## 2014-02-13 NOTE — Progress Notes (Signed)
Echo Lab  2D Echocardiogram completed.  Pinckard, RDCS 02/13/2014 8:51 AM

## 2014-02-14 ENCOUNTER — Ambulatory Visit: Payer: Medicare Other | Admitting: Endocrinology

## 2014-02-14 LAB — GLUCOSE, CAPILLARY
Glucose-Capillary: 196 mg/dL — ABNORMAL HIGH (ref 70–99)
Glucose-Capillary: 199 mg/dL — ABNORMAL HIGH (ref 70–99)

## 2014-02-14 MED ORDER — ASPIRIN 325 MG PO TABS: 325.0000 mg | ORAL_TABLET | Freq: Every day | ORAL | Status: DC

## 2014-02-14 NOTE — Progress Notes (Signed)
Stroke Team Progress Note  HISTORY [name] is a 48 y.o. female with a history of DM, hyperlipidemia, htn who underwent catheterization earlier today 02/12/2014 and followign the procedure was in her normal state. Around 10am, she took a nap and on awakening had diploplia. She was able to ambulate to the bathroom earlier. She denies vertigo, numbness, dizziness or other symptoms. Patient was not administerd TPA secondary to mild deficits. She was admitted for further evaluation and treatment.  SUBJECTIVE Still with double vision. Wants to go home.  OBJECTIVE Most recent Vital Signs: Filed Vitals:   02/13/14 1946 02/14/14 0000 02/14/14 0400 02/14/14 0700  BP: 158/92 160/69 170/98 130/69  Pulse: 91  87 85  Temp: 98.4 F (36.9 C)  97.6 F (36.4 C) 97.8 F (36.6 C)  TempSrc: Oral  Oral Oral  Resp: 18   18  Height:      Weight:   85.503 kg (188 lb 8 oz)   SpO2: 97% 97% 98% 99%   CBG (last 3)   Recent Labs  02/13/14 1623 02/13/14 2058 02/14/14 0818  GLUCAP 155* 180* 196*    IV Fluid Intake:      MEDICATIONS  . amitriptyline  150 mg Oral QHS  . amoxicillin-clavulanate  1 tablet Oral BID  . aspirin  325 mg Oral Daily  . atorvastatin  40 mg Oral q1800  . Canagliflozin  100 mg Oral Daily  . chlorproMAZINE  100 mg Oral QHS  . heparin  5,000 Units Subcutaneous 3 times per day  . insulin aspart  0-15 Units Subcutaneous TID WC  . insulin glargine  35 Units Subcutaneous Daily  . insulin pump   Subcutaneous TID AC, HS, 0200  . nicotine  21 mg Transdermal Q24H  . ramipril  5 mg Oral Daily   PRN:  senna-docusate  Diet:  Carb Control thin liquids Activity:   Bathroom privileges with assistance DVT Prophylaxis:  Heparin 5000 units sq tid   CLINICALLY SIGNIFICANT STUDIES Basic Metabolic Panel:   Recent Labs Lab 02/12/14 0630 02/12/14 1351  NA  --  142  K 3.0* 3.9  CL  --  104  CO2  --  27  GLUCOSE  --  153*  BUN  --  6  CREATININE  --  0.63  CALCIUM  --  8.7    Liver Function Tests:   Recent Labs Lab 02/12/14 1351  AST 12  ALT 10  ALKPHOS 68  BILITOT <0.2*  PROT 5.8*  ALBUMIN 2.7*   CBC:   Recent Labs Lab 02/10/14 1142 02/12/14 1351  WBC 13.1* 8.9  NEUTROABS 9.6*  --   HGB 12.6 12.1  HCT 38.3 34.7*  MCV 94.4 90.8  PLT 211.0 216   Coagulation:   Recent Labs Lab 02/12/14 1351  LABPROT 12.5  INR 0.95   Cardiac Enzymes: No results found for this basename: CKTOTAL, CKMB, CKMBINDEX, TROPONINI,  in the last 168 hours Urinalysis: No results found for this basename: COLORURINE, APPERANCEUR, LABSPEC, PHURINE, GLUCOSEU, HGBUR, BILIRUBINUR, KETONESUR, PROTEINUR, UROBILINOGEN, NITRITE, LEUKOCYTESUR,  in the last 168 hours Lipid Panel    Component Value Date/Time   CHOL 162 02/13/2014 0433   TRIG 462* 02/13/2014 0433   HDL 29* 02/13/2014 0433   CHOLHDL 5.6 02/13/2014 0433   VLDL UNABLE TO CALCULATE IF TRIGLYCERIDE OVER 400 mg/dL 02/13/2014 0433   LDLCALC UNABLE TO CALCULATE IF TRIGLYCERIDE OVER 400 mg/dL 02/13/2014 0433   HgbA1C  Lab Results  Component Value Date   HGBA1C  9.1* 01/24/2014   Drugs of Abuse     Component Value Date/Time   LABOPIA POSITIVE* 02/13/2014 1218   COCAINSCRNUR NONE DETECTED 02/13/2014 1218   LABBENZ POSITIVE* 02/13/2014 1218   AMPHETMU NONE DETECTED 02/13/2014 1218   THCU NONE DETECTED 02/13/2014 1218   LABBARB NONE DETECTED 02/13/2014 1218      Alcohol Level:   Recent Labs Lab 02/12/14 1958  ETH <11    CT of the brain  02/12/2014    Study within normal limits. No demonstrable acute infarct. No hemorrhage or mass effect.    MRI of the brain 02/13/2014 Multiple small foci of acute infarct bilaterally. Pattern suggests cerebral emboli following cardiac catheterization.   MRA of the brain  No large vessel occlusion identified. Probable mild atherosclerotic disease in the middle cerebral artery branches bilaterally.   2D Echocardiogram  Hypokinesis base inferior and base inferolateral walls. Overall EF  seems better than 01/29/2014 study. Now 45%. Systolic function was normal. No cardiac source of embolism was identified, but cannot be ruled   Carotid Doppler  No evidence of hemodynamically significant internal carotid artery stenosis. Vertebral artery flow is antegrade.   CXR  02/12/2014   No acute cardiopulmonary disease.     EKG  normal sinus rhythm, prolonged QT. For complete results please see formal report.   Therapy Recommendations CIR  Physical Exam   Middle aged caucasian lady not in distress.Awake alert. Afebrile. Head is nontraumatic. Neck is supple without bruit. Hearing is normal. Cardiac exam no murmur or gallop. Lungs are clear to auscultation. Distal pulses are well felt. Neurological Exam ;  Awake  Alert oriented x 3. Normal speech and language.eye movements full without nystagmus.fundi were not visualized. Vision acuity and fields appear normal. Hearing is normal. Palatal movements are normal. Face symmetric. Tongue midline. Normal strength, tone, reflexes and coordination. But effort is poor and variable Normal sensation. Gait deferred.  ASSESSMENT [name] is a 48 y.o. female presenting with diplopia (one on top of the other) a few hours post cardiac cath.  MRI confirms small bilateral embolic infarcts (no  brainstem stroke seen, however, likely present given sx of diplopia). Etiology a result of the catheterization. On aspirin 325 mg orally every day prior to admission. Now on aspirin 325 mg orally every day for secondary stroke prevention. No indication to change antiplatelets. Little to no risk for stroke recurrence given etiology. Patient with resultant diplopia (one on top of the other) and unsteady gait. CIR recommended by therapy.   hypertension   Hyperlipidemia, LDL unable to calculate, on no statin PTA, now on Lipitor 40 mg daily, goal LDL < 70 for diabetics  Diabetes, uncontrolled, Hemoglobin A1c 9.1 in January 2015 obstructive sleep apnea Bipolar  disorder Methicillin-resistant staph aureus COPD Tobacco abuse   Hospital day # 2  TREATMENT/PLAN  Continue aspirin 325 mg orally every day for secondary stroke prevention.  Have therapy re-eval today for stability for possible  Discharge home. Have placed rehab consult in case needed  No further stroke workup indicated.  Ongoing risk factor control by Primary Care Physician  Stroke Service will sign off. Please call should any needs arise.  Follow up with Dr. Leonie Man, [facility], in 2 months.  Burnetta Sabin, MSN, RN, ANVP-BC, ANP-BC, [facility] Pager: [phone] 02/14/2014 9:31 AM  I have personally obtained a history, examined the patient, evaluated imaging results, and formulated the assessment and plan of care. I agree with the above.  Antony Contras,  MD

## 2014-02-14 NOTE — Discharge Summary (Signed)
Physician Discharge Summary  [name] FAO:[id number] DOB: 06-08-1966 DOA: 02/12/2014  PCP: Tamsen Roers, MD  Admit date: 02/12/2014 Discharge date: 02/14/2014  Time spent: 40 minutes  Recommendations for Outpatient Follow-up:  1. Followup with primary care physician within one week. 2. Followup with Dr. Dwyane Dee within 2 weeks. 3. Followup with Dr. Leonie Man in 3-4 weeks.  Discharge Diagnoses:  Principal Problem:   CVA (cerebral infarction) Active Problems:   Unspecified essential hypertension   Type II or unspecified type diabetes mellitus with neurological manifestations, uncontrolled   Polyneuropathy in diabetes(357.2)   Hyperlipemia   Abnormal stress test   Discharge Condition: Stable  Diet recommendation: Carb modified diet  Filed Weights   02/12/14 0623 02/14/14 0400  Weight: 83.008 kg (183 lb) 85.503 kg (188 lb 8 oz)    History of present illness:  [name] is a 48 y.o. female  Past medical history of systolic heart failure with an EF of 30-35% on 01/29/2013, diabetes insulin-dependent currently on an insulin pump with the last hemoglobin A1c of 9.1 back in January 2015, peripheral neuropathy due to diabetes, hypertension, alcohol abuse daily had a cath date of admission. Following the procedure was in her normal state. Around 10am, she took a nap and on awakening had diploplia. Code stroke was called neurology evaluate her and deemed to not candidate for TPA. So we are consulted for further evaluation.  Hospital Course:   1. Acute CVA: Patient presented to the hospital for outpatient procedure for cardiac catheterization after his done patient had a nap and when she woke up she had diplopia. MRI of the brain showed multiple small strokes. According to the radiology report strokes are consistent with post cardiac catheterization stroke. Neurology was consulted, who recommended full dose of aspirin, PT/OT evaluated the patient and recommended rehabilitation. Patient  wants to go home so eye patch was recommended to alternate between eyes every 2 hours.  2. Confusion: Patient has had transient confusion while she is in the hospital which is likely secondary to medications including benzodiazepines and narcotics. Patient is back to her baseline now.  3. Hypertension: Stable, who medications continued throughout the hospital stay.  4. Diabetes mellitus type 2 uncontrolled: Hemoglobin A1c on January 30 was 9.1. Patient uses V-Go simplified insulin delivery device. Patient seems to have a lot of issues with compliance as she does not check her blood sugar, she drinks a lot of carbonated soft drinks, which contains regular sugar. She says she is allergic to artificial sweeteners. Patient follows with Dr. Dwyane Dee, and long discussion with her about compliance to diet and medications to achieve tight glycemic control. I explained to her that her dyslipidemia, hypertension and probably the recent stroke is linked to uncontrolled diabetes.  5. Cardiomyopathy: Nonischemic cardiomyopathy, patient did have 2-D echocardiogram on 01/29/2014 showed ejection fraction of 30-35%, but with probably she had the cardiac catheterization done, repeat of the 2-D echo done on 02/13/2014 showed ejection fraction of 45%. Patient to followup with her primary cardiologist. Patient does not have any symptoms or signs of decompensation. She is on ramipril, no beta blockers or diuretics.  Procedures:  Cardiac catheterization done on 2/18 by Dr. Marlou Porch just prior to the admission.  Consultations:  Stroke M.D.: Dr. Leonie Man.  Discharge Exam: Filed Vitals:   02/14/14 0700  BP: 130/69  Pulse: 85  Temp: 97.8 F (36.6 C)  Resp: 18   General: Alert and awake, oriented x3, not in any acute distress. HEENT: anicteric sclera, pupils reactive to light  and accommodation, EOMI CVS: S1-S2 clear, no murmur rubs or gallops Chest: clear to auscultation bilaterally, no wheezing, rales or  rhonchi Abdomen: soft nontender, nondistended, normal bowel sounds, no organomegaly Extremities: no cyanosis, clubbing or edema noted bilaterally Neuro: Cranial nerves II-XII intact, no focal neurological deficits  Discharge Instructions  Discharge Orders   Future Appointments Provider Department Dept Phone   02/17/2014 5:15 PM [facility] Mr [address] [facility] [place] [phone]   Patient to arrive 15 minutes prior to appointment time.   02/17/2014 6:00 PM [facility] Mr 1 [facility] [place] [phone]   Please arrive 15 minutes prior to your appointment time.   02/27/2014 1:15 PM [facility] [phone]   03/03/2014 1:45 PM Candee Furbish, MD [facility] [phone]   03/06/2014 3:30 PM Elayne Snare, MD [facility] [phone]   Future Orders Complete By Expires   Diet Carb Modified  As directed    Increase activity slowly  As directed        Medication List    STOP taking these medications       amoxicillin-clavulanate 875-125 MG per tablet  Commonly known as:  AUGMENTIN      TAKE these medications       acetaminophen-codeine 300-30 MG per tablet  Commonly known as:  TYLENOL #3  Take 1 tablet by mouth every 6 (six) hours as needed for moderate pain.     amitriptyline 150 MG tablet  Commonly known as:  ELAVIL  Take 150 mg by mouth at bedtime.     aspirin 325 MG tablet  Take 1 tablet (325 mg total) by mouth daily.     chlorproMAZINE 100 MG tablet  Commonly known as:  THORAZINE  Take 100 mg by mouth at bedtime.     diazepam 10 MG tablet  Commonly known as:  VALIUM  Take 10 mg by mouth at bedtime.     HYDROcodone-acetaminophen 10-325 MG per tablet  Commonly known as:  NORCO  Take 1 tablet by mouth every 4 (four) hours as needed for moderate pain.     NOVOLOG 100 UNIT/ML injection  Generic drug:  insulin aspart  TO USE WITH V-GO PUMP     insulin  aspart 100 UNIT/ML injection  Commonly known as:  novoLOG  To use with V-Go 40 Pump     INVOKANA 100 MG Tabs  Generic drug:  Canagliflozin  Take 100 mg by mouth daily.     lidocaine 5 % ointment  Commonly known as:  XYLOCAINE  Apply 1 application topically daily as needed.     oxycodone 5 MG capsule  Commonly known as:  OXY-IR  Take 5 mg by mouth every 4 (four) hours as needed for pain.     ramipril 5 MG capsule  Commonly known as:  ALTACE  Take 5 mg by mouth daily.     V-GO 40 Kit  by Does not apply route.       Allergies  Allergen Reactions  . Latex Itching, Rash and Other (See Comments)    Pt states she cannot use condoms - cause an infection.  Use of latex on skin is okay.  Tape causes rash  . Ibuprofen Other (See Comments)    headaches  . Sweetening Enhancer [Flavoring Agent] Nausea And Vomiting    And headaches  . Tetracyclines & Related Nausea And Vomiting    Yeast infections  . Trazodone And Nefazodone Other (  See Comments)    Hallucinations   . Triazolam Other (See Comments)    hallucinations  . Aspartame And Phenylalanine Nausea And Vomiting    headaches       Follow-up Information   Follow up with Forbes Cellar, MD. Schedule an appointment as soon as possible for a visit in 2 months. (Stroke Clinic)    Specialties:  Neurology, Radiology   Contact information:   [address] [place] [address] [phone]       Follow up with [facility], MD In 2 weeks.   Specialty:  Endocrinology   Contact information:   [place] [address] [phone]        The results of significant diagnostics from this hospitalization (including imaging, microbiology, ancillary and laboratory) are listed below for reference.    Significant Diagnostic Studies: Dg Chest 2 View  02/12/2014   CLINICAL DATA:  Stroke during heart cath.  EXAM: CHEST - 2 VIEW  COMPARISON:  01/14/2014  FINDINGS: Cervical fixation  hardware partially seen. Lungs clear. Heart size normal. . No effusion. Visualized skeletal structures are unremarkable.  IMPRESSION: No acute cardiopulmonary disease.   Electronically Signed   By: Arne Cleveland M.D.   On: 02/12/2014 21:17   Ct Head Wo Contrast  02/12/2014   CLINICAL DATA:  Dizziness and ophthalmoplegia  EXAM: CT HEAD WITHOUT CONTRAST  TECHNIQUE: Contiguous axial images were obtained from the base of the skull through the vertex without intravenous contrast.  COMPARISON:  None.  FINDINGS: Ventricles are normal in size and configuration. There is no mass, hemorrhage, extra-axial fluid collection, or midline shift. No focal gray-white compartment lesions are identified. There is no demonstrable acute infarct. Bony calvarium appears intact. The mastoid air cells are clear.  IMPRESSION: Study within normal limits. No demonstrable acute infarct. No hemorrhage or mass effect.  Critical Value/emergent results were called by telephone at the time of interpretation on 02/12/2014 at 1:53 PM to Dr. Roland Rack , who verbally acknowledged these results.   Electronically Signed   By: Lowella Grip M.D.   On: 02/12/2014 13:53   Mr Jodene Nam Head Wo Contrast  02/13/2014   CLINICAL DATA:  Stroke.  Recent cardiac catheterization.  Diplopia.  EXAM: MRI HEAD WITHOUT CONTRAST  MRA HEAD WITHOUT CONTRAST  TECHNIQUE: Multiplanar, multiecho pulse sequences of the brain and surrounding structures were obtained without intravenous contrast. Angiographic images of the head were obtained using MRA technique without contrast.  COMPARISON:  CT head 02/12/2014  FINDINGS: MRI HEAD FINDINGS  Multiple small areas of acute infarct involving the right occipital lobe, right parietal lobe, right frontal lobe, and left posterior frontal lobe. Findings are consistent with emboli. No large territory infarct identified.  Ventricle size is normal. Small hyperintensity in the right frontal white matter appears chronic. Mild  chronic ischemia in the pons. Otherwise no significant chronic ischemia. No hemorrhage or mass lesion.  Paranasal sinuses are clear  MRA HEAD FINDINGS  Both vertebral arteries are patent to the basilar. Basilar is widely patent. Superior cerebellar and posterior cerebral arteries are widely patent.  The internal carotid artery is widely patent bilaterally. Anterior and middle cerebral arteries are widely patent. Mild irregularity of the middle cerebral artery branches which may be atherosclerotic or artifact.  Negative for cerebral aneurysm.  IMPRESSION: Multiple small foci of acute infarct bilaterally. Pattern suggests cerebral emboli following cardiac catheterization.  No large vessel occlusion identified. Probable mild atherosclerotic disease in the middle cerebral artery branches  bilaterally.   Electronically Signed   By: Franchot Gallo M.D.   On: 02/13/2014 12:34   Mr Brain Wo Contrast  02/13/2014   CLINICAL DATA:  Stroke.  Recent cardiac catheterization.  Diplopia.  EXAM: MRI HEAD WITHOUT CONTRAST  MRA HEAD WITHOUT CONTRAST  TECHNIQUE: Multiplanar, multiecho pulse sequences of the brain and surrounding structures were obtained without intravenous contrast. Angiographic images of the head were obtained using MRA technique without contrast.  COMPARISON:  CT head 02/12/2014  FINDINGS: MRI HEAD FINDINGS  Multiple small areas of acute infarct involving the right occipital lobe, right parietal lobe, right frontal lobe, and left posterior frontal lobe. Findings are consistent with emboli. No large territory infarct identified.  Ventricle size is normal. Small hyperintensity in the right frontal white matter appears chronic. Mild chronic ischemia in the pons. Otherwise no significant chronic ischemia. No hemorrhage or mass lesion.  Paranasal sinuses are clear  MRA HEAD FINDINGS  Both vertebral arteries are patent to the basilar. Basilar is widely patent. Superior cerebellar and posterior cerebral arteries are  widely patent.  The internal carotid artery is widely patent bilaterally. Anterior and middle cerebral arteries are widely patent. Mild irregularity of the middle cerebral artery branches which may be atherosclerotic or artifact.  Negative for cerebral aneurysm.  IMPRESSION: Multiple small foci of acute infarct bilaterally. Pattern suggests cerebral emboli following cardiac catheterization.  No large vessel occlusion identified. Probable mild atherosclerotic disease in the middle cerebral artery branches bilaterally.   Electronically Signed   By: Franchot Gallo M.D.   On: 02/13/2014 12:34    Microbiology: Recent Results (from the past 240 hour(s))  MRSA PCR SCREENING     Status: None   Collection Time    02/12/14  5:58 PM      Result Value Ref Range Status   MRSA by PCR NEGATIVE  NEGATIVE Final   Comment:            The GeneXpert MRSA Assay (FDA     approved for NASAL specimens     only), is one component of a     comprehensive MRSA colonization     surveillance program. It is not     intended to diagnose MRSA     infection nor to guide or     monitor treatment for     MRSA infections.     Labs: Basic Metabolic Panel:  Recent Labs Lab 02/12/14 0630 02/12/14 1351  NA  --  142  K 3.0* 3.9  CL  --  104  CO2  --  27  GLUCOSE  --  153*  BUN  --  6  CREATININE  --  0.63  CALCIUM  --  8.7   Liver Function Tests:  Recent Labs Lab 02/12/14 1351  AST 12  ALT 10  ALKPHOS 68  BILITOT <0.2*  PROT 5.8*  ALBUMIN 2.7*   No results found for this basename: LIPASE, AMYLASE,  in the last 168 hours No results found for this basename: AMMONIA,  in the last 168 hours CBC:  Recent Labs Lab 02/10/14 1142 02/12/14 1351  WBC 13.1* 8.9  NEUTROABS 9.6*  --   HGB 12.6 12.1  HCT 38.3 34.7*  MCV 94.4 90.8  PLT 211.0 216   Cardiac Enzymes: No results found for this basename: CKTOTAL, CKMB, CKMBINDEX, TROPONINI,  in the last 168 hours BNP: BNP (last 3 results) No results found for  this basename: PROBNP,  in the last 8760 hours CBG:  Recent  Labs Lab 02/13/14 0708 02/13/14 1221 02/13/14 1623 02/13/14 2058 02/14/14 0818  GLUCAP 157* 211* 155* 180* 196*       Signed:  Darcell Sabino A  [facility] 02/14/2014, 11:10 AM
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
## 2014-02-14 NOTE — Progress Notes (Signed)
Thank you for consult on [name]. CIR recommended for follow up therapies. Patient focused on going home with [facility]  I attempted to explain difference between CIR level therapies v/s HH therapies but patient not interested in staying in hospital any longer. Will defer CIR consult.

## 2014-02-14 NOTE — Progress Notes (Signed)
Rehab Admissions Coordinator Note:  Patient was screened by Cleatrice Burke for appropriateness for an Inpatient Acute Rehab Consult.  At this time, we are recommending Inpatient Rehab consult if pt would like to pursue an inpt rehab admission.  Cleatrice Burke 02/14/2014, 8:39 AM  I can be reached at [phone].

## 2014-02-14 NOTE — Evaluation (Signed)
Speech Language Pathology Evaluation Patient Details Name: [name] MRN: [id number] DOB: 1966-09-21 Today's Date: 02/14/2014 Time: [id number] SLP Time Calculation (min): 30 min  Problem List:  Patient Active Problem List   Diagnosis Date Noted  . Abnormal stress test 02/12/2014  . CVA (cerebral infarction) 02/12/2014  . Chest pain   . Abnormal heart rhythm   . Edema   . Hypertension   . Hyperlipemia   . Hypercholesterolemia   . Other and unspecified hyperlipidemia 11/14/2013  . Type II or unspecified type diabetes mellitus without mention of complication, uncontrolled 07/22/2013  . Unspecified essential hypertension 07/22/2013  . 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 Polyneuropathy in diabetes(357.2) 07/22/2013   Past Medical History:  Past Medical History  Diagnosis Date  . Hypertension   . Hyperlipemia   . GERD (gastroesophageal reflux disease)   . Insomnia   . SUI (stress urinary incontinence, female) S/P SLING 12-29-2011  . SOB (shortness of breath) on exertion   . Bipolar disorder   . Neurogenic bladder   . Hypercholesterolemia   . Gastroparesis   . Ulcer   . MRSA (methicillin resistant Staphylococcus aureus)   . Chronic pain   . Edema   . Chest pain     a. 2008 Cath: normal cors;  b. 12/2013 Lexi CL: EF 47%, no ischemia/infarct.  . Anxiety   . Vertigo   . COPD (chronic obstructive pulmonary disease)   . Cholecystitis   . Hypersomnia   . Tobacco abuse   . Claudication     a. 12/2013 ABI's: R 0.97, L 0.94.  [name] Palpitations   . Anginal pain   . Pneumonia     "twice, I think" (02/12/2014)  . Sleep apnea     didn't tolerate cpap (02/12/2014)  . Type II diabetes mellitus   . History of blood transfusion 1986    "related to lost a child" (02/12/2014)  . H/O hiatal hernia   . [id number])     "weekly" (02/12/2014)  . Stroke 02/12/2014    "eyesight is messed up; not steady on my feet"  (02/12/2014)  . Arthritis     "qwhere" (02/12/2014)  . DJD (degenerative joint disease)   . Chronic back pain   . Depression   . Personality disorder    Past Surgical History:  Past Surgical History  Procedure Laterality Date  . Knee arthroscopy w/ allograft impant Left     graft x 2  . Foot surgery Bilateral   . Anterior cervical decomp/discectomy fusion  2000    C6 - 7  . Pubovaginal sling  12/29/2011    Procedure: Gaynelle Arabian;  Surgeon: Reece Packer, MD;  Location: [facility];  Service: Urology;  Laterality: N/A;  cysto and sparc sling   . Lumbar fusion      "cage in my spine"  . Total abdominal hysterectomy w/ bilateral salpingoophorectomy  1997  . Multiple laparoscopies for endometriosis    . Cesarean section  1989; 1992  . Repeat reconstruction acl left knee/ screws removed  03-28-2000    CADAVER GRAFT  . Reconsturction of congenital uterus anomaly  36  . Knee surgery      TOTAL 8 SURG'S  . Cystoscopy with injection  05/04/2012    Procedure: CYSTOSCOPY WITH INJECTION;  Surgeon: Reece Packer, MD;  Location: [facility];  Service: Urology;  Laterality: N/A;  MACROPLASTIQUE INJECTION  . Cystoscopy with injection  08/28/2012  Procedure: CYSTOSCOPY WITH INJECTION;  Surgeon: Reece Packer, MD;  Location: [facility];  Service: Urology;  Laterality: N/A;  cysto and macroplastique   . Carpal tunnel release Right 04-25-2013  . Cysto N/A 04/30/2013    Procedure: CYSTOSCOPY;  Surgeon: Reece Packer, MD;  Location: [facility];  Service: Urology;  Laterality: N/A;  . Pubovaginal sling N/A 04/30/2013    Procedure: REMOVAL OF VAGINAL MESH;  Surgeon: Reece Packer, MD;  Location: [facility];  Service: Urology;  Laterality: N/A;  . Abdominal hysterectomy    . Hernia repair  ?1996    "stomach"  . Cardiac catheterization  05-22-2008   DR SKAINS    NO SIGNIFECANT CAD/ NORMAL LV/  EF 65%/  NO WALL MOTION ABNORMALITIES  .  Cardiac catheterization  02/12/2014   HPI:  48 y.o. female admitted to [facility] on 02/12/14 with history of systolic heart failure with an EF of 30-35% on 01/29/2013, diabetes insulin-dependent currently on an insulin pump with the last hemoglobin A1c of 9.1 back in January 2015, peripheral neuropathy due to diabetes, hypertension, alcohol abuse daily had a cath date of admission. Following the procedure was in her normal state. Around 10am, she took a nap and on awakening had diploplia. Code stroke was called neurology evaluate her and deemed to not candidate for TPA.  MRI revealed Multiple small areas of acute infarct involving the right occipital lobe, right parietal lobe, right frontal lobe, and left posterior frontal lobe. Findings are consistent with emboli. No large territory infarct identified.     Assessment / Plan / Recommendation Clinical Impression  The Montreal Cognitive Assessment was administered.  Pt reports a 6th grade education.  Pt's overall score was 24/30 (including 1 point for less than 12 years education).  Deficits noted in alternating trail making, selective attention, abstract reasoning, and delayed recall.  Pt is currently preparing for discharge home with home health.  Recommend home health speech therapy in addition to other disciplines.    SLP Assessment  All further Speech Lanaguage Pathology  needs can be addressed in the next venue of care    Follow Up Recommendations  Home health SLP    Frequency and Duration        Pertinent Vitals/Pain VSS, no pain reported   SLP Goals   defer to next venue  SLP Evaluation Prior Functioning  Type of Home: House Available Help at Discharge: Other (Comment) (boyfriend) Education: 6th grade Vocation: Unemployed   Cognition  Overall Cognitive Status: Impaired/Different from baseline Arousal/Alertness: Awake/alert Orientation Level: Oriented X4 Attention: Focused;Sustained;Selective Focused Attention: Appears  intact Sustained Attention: Appears intact Selective Attention: Impaired Selective Attention Impairment: Verbal basic Memory: Impaired Memory Impairment: Storage deficit;Retrieval deficit;Decreased short term memory;Prospective memory;Decreased recall of new information Decreased Short Term Memory: Verbal basic Awareness: Appears intact Problem Solving: Impaired Problem Solving Impairment: Verbal basic Executive Function: Reasoning Reasoning: Impaired Reasoning Impairment: Verbal basic Behaviors: Poor frustration tolerance;Verbal agitation Safety/Judgment: Impaired    Comprehension  Auditory Comprehension Overall Auditory Comprehension: Appears within functional limits for tasks assessed Visual Recognition/Discrimination Discrimination: Not tested Reading Comprehension Reading Status: Not tested    Expression Expression Primary Mode of Expression: Verbal Verbal Expression Overall Verbal Expression: Appears within functional limits for tasks assessed Written Expression Dominant Hand: Right Written Expression: Not tested   Oral / Motor Oral Motor/Sensory Function Overall Oral Motor/Sensory Function: Appears within functional limits for tasks assessed Motor Speech Overall Motor Speech: Appears within functional limits for tasks assessed   GO  Johnathen Testa B. Qu[facility], CCC-SLP [id number]  Shonna Chock 02/14/2014, 3:04 PM

## 2014-02-14 NOTE — Care Management Note (Signed)
    Page 1 of 2   02/14/2014     12:13:28 PM   CARE MANAGEMENT NOTE 02/14/2014  Patient:  [name], [name]   Account Number:  [id number]  Date Initiated:  02/14/2014  Documentation initiated by:  GRAVES-BIGELOW,Bentlee [place]  Subjective/Objective Assessment:   Pt admitted for CVA. Plan for d/c home today.     Action/Plan:   CM did make referral for [place] with [facility]. SOC to begin within 24-48 hrs post d/c.  DME RW and Shower Stool to be delivered to room. No further needs from CM at this time.   Anticipated DC Date:  02/14/2014   Anticipated DC Plan:  [place]  CM consult      [place]   Choice offered to / List presented to:  C-1 Patient        [place] arranged  HH-1 RN  [place]      [place].   Status of service:  Completed, signed off Medicare Important Message given?   (If response is "NO", the following Medicare IM given date fields will be blank) Date Medicare IM given:   Date Additional Medicare IM given:    Discharge Disposition:  [place]  Per UR Regulation:  Reviewed for med. necessity/level of care/duration of stay  If discussed at [place] of Stay Meetings, dates discussed:    Comments:

## 2014-02-14 NOTE — Discharge Instructions (Signed)
Radial Site Care Refer to this sheet in the next few weeks. These instructions provide you with information on caring for yourself after your procedure. Your caregiver may also give you more specific instructions. Your treatment has been planned according to current medical practices, but problems sometimes occur. Call your caregiver if you have any problems or questions after your procedure. HOME CARE INSTRUCTIONS  You may shower the day after the procedure.Remove the bandage (dressing) and gently wash the site with plain soap and water.Gently pat the site dry.  Do not apply powder or lotion to the site.  Do not submerge the affected site in water for 3 to 5 days.  Inspect the site at least twice daily.  Do not flex or bend the affected arm for 24 hours.  No lifting over 5 pounds (2.3 kg) for 5 days after your procedure.  Do not drive home if you are discharged the same day of the procedure. Have someone else drive you.  You may drive 24 hours after the procedure unless otherwise instructed by your caregiver.  Do not operate machinery or power tools for 24 hours.  A responsible adult should be with you for the first 24 hours after you arrive home. What to expect:  Any bruising will usually fade within 1 to 2 weeks.  Blood that collects in the tissue (hematoma) may be painful to the touch. It should usually decrease in size and tenderness within 1 to 2 weeks. SEEK IMMEDIATE MEDICAL CARE IF:  You have unusual pain at the radial site.  You have redness, warmth, swelling, or pain at the radial site.  You have drainage (other than a small amount of blood on the dressing).  You have chills.  You have a fever or persistent symptoms for more than 72 hours.  You have a fever and your symptoms suddenly get worse.  Your arm becomes pale, cool, tingly, or numb.  You have heavy bleeding from the site. Hold pressure on the site. Document Released: 01/14/2011 Document Revised:  03/05/2012 Document Reviewed: 01/14/2011 [facility] Patient Information 2014 [place], [place].   Home Health  Services arranged with [facility]. [phone].  Registered Nurse, Physical & Occupational Therapy, Aide.  Durable Medical Equipment Delivered to room via [place]. [employer].

## 2014-02-14 NOTE — Progress Notes (Signed)
Pt is being discharged home. Pt is being transported home by her family. Pt has been provided with discharge instructions. RN went over discharge instructions with the patient and answered all questions the patient had

## 2014-02-17 ENCOUNTER — Telehealth: Payer: Self-pay | Admitting: Cardiology

## 2014-02-17 ENCOUNTER — Ambulatory Visit
Admission: RE | Admit: 2014-02-17 | Discharge: 2014-02-17 | Disposition: A | Discharge status: Home / Self Care | Payer: Medicare Other | Source: Ambulatory Visit | Attending: Neurosurgery | Admitting: Neurosurgery

## 2014-02-17 DIAGNOSIS — M545 Low back pain, unspecified: Secondary | ICD-10-CM

## 2014-02-17 DIAGNOSIS — M542 Cervicalgia: Secondary | ICD-10-CM

## 2014-02-17 MED ORDER — GADOBENATE DIMEGLUMINE 529 MG/ML IV SOLN
17.0000 mL | Freq: Once | INTRAVENOUS | Status: AC | PRN
  Administered 2014-02-17: 17 mL via INTRAVENOUS

## 2014-02-17 NOTE — Telephone Encounter (Signed)
Monitor results. 

## 2014-02-21 ENCOUNTER — Other Ambulatory Visit: Payer: Self-pay | Admitting: *Deleted

## 2014-02-21 MED ORDER — GLUCOSE BLOOD VI STRP: ORAL_STRIP | Status: DC

## 2014-02-25 ENCOUNTER — Telehealth: Payer: Self-pay | Admitting: *Deleted

## 2014-02-25 ENCOUNTER — Telehealth: Payer: Self-pay | Admitting: Neurology

## 2014-02-25 NOTE — Telephone Encounter (Signed)
Agree with plan. Have see Dr Marlou Porch for arryhtmia w/wu

## 2014-02-25 NOTE — Telephone Encounter (Signed)
Patient called and wanted to let you know that on February 18th, she had a stroke.  She is now home from the hospital

## 2014-02-25 NOTE — Telephone Encounter (Signed)
I called pt.  She has a transient episode of 5 minutes duration of sharp shooting pain in back of head then uncontrollable shaking, then felt heart arrhythmia.  I told her I would let Dr. Leonie Man know.  I placed on waitlist for 2 mo appt from discharge from hospital.  She is taking aspirin 325mg  po daily.  I told her to let us know if any other episodes.  Recommended to call Dr. Marlou Porch, cardiology to let them know.

## 2014-02-25 NOTE — Telephone Encounter (Signed)
Pt called in and stated that she got out of the hospital on 02/14/14 for a stroke. She has an appt scheduled with Dr. Leonie Man in June, however she stated that yesterday she had a pain on the right side of her head at the top of her earlobe to the back of her head.  When the pain happened she also began to shake uncontrollably for 5 minutes, 10 at most.  She said her chest felt weird and her heart went out of rhythm.  Would it be possible for her to see Dr. Leonie Man sooner?  Please call to discuss.  Thank you

## 2014-02-27 ENCOUNTER — Other Ambulatory Visit: Payer: Medicare Other

## 2014-02-27 NOTE — Telephone Encounter (Signed)
I called pt and LM re: Dr. Clydene Fake note.  She is to call back as needed.

## 2014-02-28 ENCOUNTER — Other Ambulatory Visit (INDEPENDENT_AMBULATORY_CARE_PROVIDER_SITE_OTHER): Payer: Medicare Other

## 2014-02-28 DIAGNOSIS — E1149 Type 2 diabetes mellitus with other diabetic neurological complication: Secondary | ICD-10-CM

## 2014-02-28 LAB — BASIC METABOLIC PANEL
BUN: 6 mg/dL (ref 6–23)
CO2: 22 mEq/L (ref 19–32)
Calcium: 8.8 mg/dL (ref 8.4–10.5)
Chloride: 99 mEq/L (ref 96–112)
Creatinine, Ser: 0.9 mg/dL (ref 0.4–1.2)
GFR: 71.72 mL/min (ref >60.00)
GLUCOSE: 288 mg/dL — AB (ref 70–99)
POTASSIUM: 3.6 meq/L (ref 3.5–5.1)
Sodium: 133 mEq/L — ABNORMAL LOW (ref 135–145)

## 2014-03-01 LAB — FRUCTOSAMINE: Fructosamine: 243 umol/L (ref <285)

## 2014-03-03 ENCOUNTER — Telehealth: Payer: Self-pay | Admitting: Cardiology

## 2014-03-03 ENCOUNTER — Ambulatory Visit (INDEPENDENT_AMBULATORY_CARE_PROVIDER_SITE_OTHER): Payer: Medicare Other | Admitting: Cardiology

## 2014-03-03 ENCOUNTER — Encounter: Payer: Self-pay | Admitting: Cardiology

## 2014-03-03 VITALS — BP 122/62 | HR 80 | Ht 65.0 in | Wt 194.0 lb

## 2014-03-03 DIAGNOSIS — E1149 Type 2 diabetes mellitus with other diabetic neurological complication: Secondary | ICD-10-CM

## 2014-03-03 DIAGNOSIS — I6350 Cerebral infarction due to unspecified occlusion or stenosis of unspecified cerebral artery: Secondary | ICD-10-CM

## 2014-03-03 DIAGNOSIS — E785 Hyperlipidemia, unspecified: Secondary | ICD-10-CM

## 2014-03-03 DIAGNOSIS — G629 Polyneuropathy, unspecified: Secondary | ICD-10-CM

## 2014-03-03 DIAGNOSIS — I639 Cerebral infarction, unspecified: Secondary | ICD-10-CM

## 2014-03-03 DIAGNOSIS — E7800 Pure hypercholesterolemia, unspecified: Secondary | ICD-10-CM

## 2014-03-03 DIAGNOSIS — I10 Essential (primary) hypertension: Secondary | ICD-10-CM

## 2014-03-03 DIAGNOSIS — G609 Hereditary and idiopathic neuropathy, unspecified: Secondary | ICD-10-CM

## 2014-03-03 NOTE — Patient Instructions (Signed)
Your physician recommends that you continue on your current medications as directed. Please refer to the Current Medication list given to you today.  Your physician recommends that you schedule a follow-up appointment in: 2 months with Dr. Marlou Porch.

## 2014-03-03 NOTE — Progress Notes (Signed)
[facility]. [address] [place], [place]  [address] Phone: [phone] Fax:  [phone]  Date:  03/03/2014   ID:  [name], DOB 06/08/1966, MRN [id number]  PCP:  Tamsen Roers, MD   History of Present Illness: [name] is a 48 y.o. female who underwent cardiac catheterization on 02/12/14 after abnormal stress test which revealed normal coronary arteries thankfully however approximately 3 hours after procedure and she was getting up to go to the bathroom, she noted diplopia and had a third nerve palsy, intranuclear ophthalmoplegia. Code stroke was called, neurology assessed her and she was monitored in the hospital for the next 24-48 hours. During that same period of time, her significant other was hospitalized as well for heart failure exacerbation.  On 02/25/14 she called Dr. Clydene Fake office with some concerns of transient 5 minute episode of sharp shooting pain in the back of the head and uncontrollable shaking and felt a heart arrhythmia. Pain was on the right side on the top of her head, earlobe at the back of the head.  Ejection fraction appeared normal on catheterization.  Eye patch was recommended rotating between eye every 2 hours. Full dose of that was recommended by neurology.  Wt Readings from Last 3 Encounters:  03/03/14 194 lb (87.998 kg)  02/14/14 188 lb 8 oz (85.503 kg)  02/14/14 188 lb 8 oz (85.503 kg)     Past Medical History  Diagnosis Date  . Hypertension   . Hyperlipemia   . GERD (gastroesophageal reflux disease)   . Insomnia   . SUI (stress urinary incontinence, female) S/P SLING 12-29-2011  . SOB (shortness of breath) on exertion   . Bipolar disorder   . Neurogenic bladder   . Hypercholesterolemia   . Gastroparesis   . Ulcer   . MRSA (methicillin resistant Staphylococcus aureus)   . Chronic pain   . Edema   . Chest pain     a. 2008 Cath: normal cors;  b. 12/2013 Lexi CL: EF 47%, no ischemia/infarct.  . Anxiety   . Vertigo   . COPD  (chronic obstructive pulmonary disease)   . Cholecystitis   . Hypersomnia   . Tobacco abuse   . Claudication     a. 12/2013 ABI's: R 0.97, L 0.94.  [name] Palpitations   . Anginal pain   . Pneumonia     "twice, I think" (02/12/2014)  . Sleep apnea     didn't tolerate cpap (02/12/2014)  . Type II diabetes mellitus   . History of blood transfusion 1986    "related to lost a child" (02/12/2014)  . H/O hiatal hernia   . RWERXVQM(086.7)     "weekly" (02/12/2014)  . Stroke 02/12/2014    "eyesight is messed up; not steady on my feet" (02/12/2014)  . Arthritis     "qwhere" (02/12/2014)  . DJD (degenerative joint disease)   . Chronic back pain   . Depression   . Personality disorder     Past Surgical History  Procedure Laterality Date  . Knee arthroscopy w/ allograft impant Left     graft x 2  . Foot surgery Bilateral   . Anterior cervical decomp/discectomy fusion  2000    C6 - 7  . Pubovaginal sling  12/29/2011    Procedure: Gaynelle Arabian;  Surgeon: Reece Packer, MD;  Location: [facility];  Service: Urology;  Laterality: N/A;  cysto and sparc sling   . Lumbar fusion      "  cage in my spine"  . Total abdominal hysterectomy w/ bilateral salpingoophorectomy  1997  . Multiple laparoscopies for endometriosis    . Cesarean section  1989; 1992  . Repeat reconstruction acl left knee/ screws removed  03-28-2000    CADAVER GRAFT  . Reconsturction of congenital uterus anomaly  62  . Knee surgery      TOTAL 8 SURG'S  . Cystoscopy with injection  05/04/2012    Procedure: CYSTOSCOPY WITH INJECTION;  Surgeon: Reece Packer, MD;  Location: [facility];  Service: Urology;  Laterality: N/A;  MACROPLASTIQUE INJECTION  . Cystoscopy with injection  08/28/2012    Procedure: CYSTOSCOPY WITH INJECTION;  Surgeon: Reece Packer, MD;  Location[facility];  Service: Urology;  Laterality: N/A;  cysto and macroplastique   . Carpal tunnel release Right  04-25-2013  . Cysto N/A 04/30/2013    Procedure: CYSTOSCOPY;  Surgeon: Reece Packer, MD;  Location: [facility];  Service: Urology;  Laterality: N/A;  . Pubovaginal sling N/A 04/30/2013    Procedure: REMOVAL OF VAGINAL MESH;  Surgeon: Reece Packer, MD;  Location: [facility];  Service: Urology;  Laterality: N/A;  . Abdominal hysterectomy    . Hernia repair  ?1996    "stomach"  . Cardiac catheterization  05-22-2008   DR SKAINS    NO SIGNIFECANT CAD/ NORMAL LV/  EF 65%/  NO WALL MOTION ABNORMALITIES  . Cardiac catheterization  02/12/2014    Current Outpatient Prescriptions  Medication Sig Dispense Refill  . chlorproMAZINE (THORAZINE) 100 MG tablet Take 100 mg by mouth at bedtime.       . INVOKANA 100 MG TABS Take 100 mg by mouth daily.       [name] lidocaine (XYLOCAINE) 5 % ointment Apply 1 application topically daily as needed.      [name] NEXIUM 40 MG capsule       . ramipril (ALTACE) 5 MG capsule Take 5 mg by mouth daily.      [name] amitriptyline (ELAVIL) 150 MG tablet Take 150 mg by mouth at bedtime.      [name] aspirin 325 MG tablet Take 1 tablet (325 mg total) by mouth daily.      . diazepam (VALIUM) 10 MG tablet Take 10 mg by mouth at bedtime.      [name] HYDROcodone-acetaminophen (NORCO) 10-325 MG per tablet Take 1 tablet by mouth every 4 (four) hours as needed for moderate pain.      [name] insulin aspart (NOVOLOG) 100 UNIT/ML injection To use with V-Go 40 Pump      . insulin aspart (NOVOLOG) 100 UNIT/ML injection TO USE WITH V-GO PUMP      . Insulin Disposable Pump (V-GO 40) KIT by Does not apply route.      [name] oxyCODONE-acetaminophen (PERCOCET) 10-325 MG per tablet       . promethazine (PHENERGAN) 25 MG tablet        No current facility-administered medications for this visit.    Allergies:    Allergies  Allergen Reactions  . Latex Itching, Rash and Other (See Comments)    Pt states she cannot use condoms - cause an infection.  Use of latex on skin is okay.  Tape causes rash  . Ibuprofen Other (See  Comments)    headaches  . Sweetening Enhancer [Flavoring Agent] Nausea And Vomiting    And headaches  . Tetracyclines & Related Nausea And Vomiting    Yeast infections  . Trazodone And Nefazodone Other (See  Comments)    Hallucinations   . Triazolam Other (See Comments)    hallucinations  . Aspartame And Phenylalanine Nausea And Vomiting    headaches    Social History:  The patient  reports that she has been smoking Cigarettes and E-cigarettes.  She has a 41 pack-year smoking history. She has never used smokeless tobacco. She reports that she drinks alcohol. She reports that she does not use illicit drugs.   ROS:  Please see the history of present illness.   Improved diplopia however still present. No chest pain, no significant short of breath. No further strokelike symptoms.  PHYSICAL EXAM: VS:  BP 122/62  Pulse 80  Ht '5\' 5"'  (1.651 m)  Wt 194 lb (87.998 kg)  BMI 32.28 kg/m2 Well nourished, well developed, in no acute distress HEENT: normal Neck: no JVD Cardiac:  normal S1, S2; RRR; no murmur Lungs:  clear to auscultation bilaterally, no wheezing, rhonchi or rales Abd: soft, nontender, no hepatomegaly Ext: no edema Skin: warm and dry Neuro: Her eye deviation has improved. Less marked nerve 3 palsy.  EKG: None today Cardiac catheterization 2/15-no angiographic significant CAD-EF 55%    ASSESSMENT AND PLAN:  1. Stroke-occurred approximately 3 hours post cardiac catheterization. 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 No driving. She has an appointment with neurology in June. We discussed at length the pathophysiology of stroke. I described her that she is at increased risk for stroke in the future having had prior strokes, diabetes, tobacco use. Currently wearing eye patch rotating every 2 hours. On exam today, she has marked improvement of eye movement. Aspirin, secondary prevention. 2. Tobacco use-encourage cessation. She is using e-cigarette. 3. Diabetes-hemoglobin A1c in  the 9 range. Improved from 13. She is working with Dr. Dwyane Dee on this. 4. She has a copy of her discharge summary which states she uses chronic daily alcohol abuse and she tells me that this is incorrect. She states that she does not drink. I have forwarded a message to Dr. Hartford Poli to consider ammendment of his discharge summary. 5. Peripheral neuropathy-I've asked her to call her primary physician, Dr. Tamsen Roers, for a Xylocaine renewal. 6. I will see her back in 2 months. Reassuring angiogram. I would like for her to be on atorvastatin 40 mg once a day. I will have this called in for her. We will check lipid profile and ALT in 2 months.  Signed, Candee Furbish, MD [facility]  03/03/2014 1:53 PM

## 2014-03-03 NOTE — Telephone Encounter (Signed)
Rx change with scheduled labs....Marland KitchenMarland Kitchen

## 2014-03-04 ENCOUNTER — Other Ambulatory Visit: Payer: Self-pay | Admitting: *Deleted

## 2014-03-04 MED ORDER — ATORVASTATIN CALCIUM 40 MG PO TABS: 40.0000 mg | ORAL_TABLET | Freq: Every day | ORAL | Status: DC

## 2014-03-06 ENCOUNTER — Other Ambulatory Visit: Payer: Self-pay | Admitting: *Deleted

## 2014-03-06 ENCOUNTER — Encounter: Payer: Self-pay | Admitting: Endocrinology

## 2014-03-06 ENCOUNTER — Ambulatory Visit (INDEPENDENT_AMBULATORY_CARE_PROVIDER_SITE_OTHER): Payer: Medicare Other | Admitting: Endocrinology

## 2014-03-06 VITALS — BP 136/94 | HR 93 | Temp 97.8°F | Resp 16 | Ht 64.0 in | Wt 197.4 lb

## 2014-03-06 DIAGNOSIS — E785 Hyperlipidemia, unspecified: Secondary | ICD-10-CM

## 2014-03-06 DIAGNOSIS — I6350 Cerebral infarction due to unspecified occlusion or stenosis of unspecified cerebral artery: Secondary | ICD-10-CM

## 2014-03-06 DIAGNOSIS — I10 Essential (primary) hypertension: Secondary | ICD-10-CM

## 2014-03-06 DIAGNOSIS — IMO0001 Reserved for inherently not codable concepts without codable children: Secondary | ICD-10-CM

## 2014-03-06 DIAGNOSIS — E1165 Type 2 diabetes mellitus with hyperglycemia: Principal | ICD-10-CM

## 2014-03-06 MED ORDER — GLUCOSE BLOOD VI STRP: ORAL_STRIP | Status: DC

## 2014-03-06 MED ORDER — SPIRONOLACTONE 25 MG PO TABS: 25.0000 mg | ORAL_TABLET | Freq: Every day | ORAL | Status: DC

## 2014-03-06 NOTE — Progress Notes (Signed)
Patient ID: [name], female   DOB: January 09, 1966, 48 y.o.   MRN: [id number]   Reason for Appointment: Diabetes follow-up   History of Present Illness:   Diagnosis: Type 2 diabetes mellitus, date of diagnosis: 2004.  PAST history: She has been treated mostly with insulin since about a year after diagnosis. She has had difficulty with consistent compliance with diet and also compliance with self care including glucose monitoring over the years. She had been mostly treated with basal insulin. Also had been tried on mealtime insulin but she would be noncompliant with this. Was tried on Prandin for mealtime control but difficult to judge efficacy because of lack of postprandial monitoring. Was given Victoza to start in 2011 but did not follow up after this.She was tried on premixed insulin but this did not help her control, mostly because of noncompliance with the doses. She has been using the V- go pump and had better control initially and was better compliant with the daily routine and boluses. She has had frequent education visits also.   RECENT history:   On her last visit because of significantly high readings her V-go pump was changed to the 40 unit basal unit She thinks she is trying to be more compliant with wearing the pump She thinks it is uncomfortable on bearing weight on the left side of her abdomen However has not checked her blood sugar much and did not drink her monitor; also has not started using the FreeStyle monitor as directed She was given the FreeStyle monitor because of difficulty with finger sticks and she thinks she is using the alternate site testing with the sample strips given She also thinks he is bolusing for her meals and snacks The fructosamine is improved further indicating better control , however her glucose in the lab was 288 Most likely she is still not watching her diet and drinking regular soft drinks Mealtime insulin: 6 clicks whenever she is eating a meal  which is at variable times; 3 for snacks  She had no side effects with Invokana   Glucometer: ? [employer] brand or the FreeStyle.  Blood Glucose readings: Around 160 Hypoglycemia frequency: Never.    Food preferences: eating  1 or 2 meals per day .  Physical activity: exercise: Minimal.  Certified Diabetes Educator visit: Most recent:, 2/14.  The diet that the patient has been following is no specific diet; still eating at irregular times, no balanced meals.   Wt Readings from Last 3 Encounters:  03/06/14 197 lb 6.4 oz (89.54 kg)  03/03/14 194 lb (87.998 kg)  02/14/14 188 lb 8 oz (85.503 kg)   DM labs:  Lab Results  Component Value Date   HGBA1C 9.1* 01/24/2014   HGBA1C 8.8* 11/06/2013   HGBA1C 12.0* 07/22/2013   Lab Results  Component Value Date   MICROALBUR 0.2 12/13/2013   LDLCALC UNABLE TO CALCULATE IF TRIGLYCERIDE OVER 400 mg/dL 02/13/2014   CREATININE 0.9 02/28/1442   Complications: are: peripheral neuropathy.  PROBLEM 2: Asking about tenderness of her fingertips, discomfort in her hands.  she was told by the pharmacist that she can try lidocaine jelly for relief She does not want to use Lyrica and does not get enough relief from gabapentin   Labs:  Appointment on 02/28/2014  Component Date Value Ref Range Status  . Sodium 02/28/2014 133* 135 - 145 mEq/L Final  . Potassium 02/28/2014 3.6  3.5 - 5.1 mEq/L Final  . Chloride 02/28/2014 99  96 - 112 mEq/L  Final  . CO2 02/28/2014 22  19 - 32 mEq/L Final  . Glucose, Bld 02/28/2014 288* 70 - 99 mg/dL Final  . BUN 02/28/2014 6  6 - 23 mg/dL Final  . Creatinine, Ser 02/28/2014 0.9  0.4 - 1.2 mg/dL Final  . Calcium 02/28/2014 8.8  8.4 - 10.5 mg/dL Final  . GFR 02/28/2014 71.72  >60.00 mL/min Final  . Fructosamine 02/28/2014 243  <285 umol/L Final   Comment:                            Variations in levels of serum proteins (albumin and immunoglobulins)                          may affect fructosamine results.                                  Medication List       This list is accurate as of: 03/06/14 11:59 PM.  Always use your most recent med list.               amitriptyline 150 MG tablet  Commonly known as:  ELAVIL  Take 150 mg by mouth at bedtime.     aspirin 325 MG tablet  Take 1 tablet (325 mg total) by mouth daily.     atorvastatin 40 MG tablet  Commonly known as:  LIPITOR  Take 1 tablet (40 mg total) by mouth daily.     chlorproMAZINE 100 MG tablet  Commonly known as:  THORAZINE  Take 100 mg by mouth at bedtime.     ciprofloxacin 500 MG tablet  Commonly known as:  CIPRO     diazepam 10 MG tablet  Commonly known as:  VALIUM  Take 10 mg by mouth at bedtime.     glucose blood test strip  Commonly known as:  FREESTYLE LITE  Use as instructed to check blood sugar 2 times daily dx code 250.02     HYDROcodone-acetaminophen 10-325 MG per tablet  Commonly known as:  NORCO  Take 1 tablet by mouth every 4 (four) hours as needed for moderate pain.     NOVOLOG 100 UNIT/ML injection  Generic drug:  insulin aspart  TO USE WITH V-GO PUMP     insulin aspart 100 UNIT/ML injection  Commonly known as:  novoLOG  To use with V-Go 40 Pump     INVOKANA 100 MG Tabs  Generic drug:  Canagliflozin  Take 100 mg by mouth daily.     lidocaine 2 % jelly  Commonly known as:  XYLOCAINE     lidocaine 5 % ointment  Commonly known as:  XYLOCAINE  Apply 1 application topically daily as needed.     NEXIUM 40 MG capsule  Generic drug:  esomeprazole     oxyCODONE 5 MG immediate release tablet  Commonly known as:  Oxy IR/ROXICODONE     oxyCODONE-acetaminophen 10-325 MG per tablet  Commonly known as:  PERCOCET     promethazine 25 MG tablet  Commonly known as:  PHENERGAN     ramipril 5 MG capsule  Commonly known as:  ALTACE  Take 5 mg by mouth daily.     spironolactone 25 MG tablet  Commonly known as:  ALDACTONE  Take 1 tablet (25 mg total) by mouth daily.     V-GO 40  Kit  by Does not apply  route.        Allergies:  Allergies  Allergen Reactions  . Latex Itching, Rash and Other (See Comments)    Pt states she cannot use condoms - cause an infection.  Use of latex on skin is okay.  Tape causes rash  . Ibuprofen Other (See Comments)    headaches  . Sweetening Enhancer [Flavoring Agent] Nausea And Vomiting    And headaches  . Tetracyclines & Related Nausea And Vomiting    Yeast infections  . Trazodone And Nefazodone Other (See Comments)    Hallucinations   . Triazolam Other (See Comments)    hallucinations  . Aspartame And Phenylalanine Nausea And Vomiting    headaches    Past Medical History  Diagnosis Date  . Hypertension   . Hyperlipemia   . GERD (gastroesophageal reflux disease)   . Insomnia   . SUI (stress urinary incontinence, female) S/P SLING 12-29-2011  . SOB (shortness of breath) on exertion   . Bipolar disorder   . Neurogenic bladder   . Hypercholesterolemia   . Gastroparesis   . Ulcer   . MRSA (methicillin resistant Staphylococcus aureus)   . Chronic pain   . Edema   . Chest pain     a. 2008 Cath: normal cors;  b. 12/2013 Lexi CL: EF 47%, no ischemia/infarct.  . Anxiety   . Vertigo   . COPD (chronic obstructive pulmonary disease)   . Cholecystitis   . Hypersomnia   . Tobacco abuse   . Claudication     a. 12/2013 ABI's: R 0.97, L 0.94.  [name] Palpitations   . Anginal pain   . Pneumonia     "twice, I think" (02/12/2014)  . Sleep apnea     didn't tolerate cpap (02/12/2014)  . Type II diabetes mellitus   . History of blood transfusion 1986    "related to lost a child" (02/12/2014)  . H/O hiatal hernia   . TMLYYTKP(546.5)     "weekly" (02/12/2014)  . Stroke 02/12/2014    "eyesight is messed up; not steady on my feet" (02/12/2014)  . Arthritis     "qwhere" (02/12/2014)  . DJD (degenerative joint disease)   . Chronic back pain   . Depression   . Personality disorder     Past Surgical History  Procedure Laterality Date  . Knee arthroscopy  w/ allograft impant Left     graft x 2  . Foot surgery Bilateral   . Anterior cervical decomp/discectomy fusion  2000    C6 - 7  . Pubovaginal sling  12/29/2011    Procedure: Gaynelle Arabian;  Surgeon: Reece Packer, MD;  Location: [facility];  Service: Urology;  Laterality: N/A;  cysto and sparc sling   . Lumbar fusion      "cage in my spine"  . Total abdominal hysterectomy w/ bilateral salpingoophorectomy  1997  . Multiple laparoscopies for endometriosis    . Cesarean section  1989; 1992  . Repeat reconstruction acl left knee/ screws removed  03-28-2000    CADAVER GRAFT  . 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 Knee surgery      TOTAL 8 SURG'S  . Cystoscopy with injection  05/04/2012    Procedure: CYSTOSCOPY WITH INJECTION;  Surgeon: Reece Packer, MD;  Location: [facility];  Service: Urology;  Laterality: N/A;  MACROPLASTIQUE INJECTION  . Cystoscopy with injection  08/28/2012    Procedure: CYSTOSCOPY  WITH INJECTION;  Surgeon: Reece Packer, MD;  Location: [facility];  Service: Urology;  Laterality: N/A;  cysto and macroplastique   . Carpal tunnel release Right 04-25-2013  . Cysto N/A 04/30/2013    Procedure: CYSTOSCOPY;  Surgeon: Reece Packer, MD;  Location: [facility];  Service: Urology;  Laterality: N/A;  . Pubovaginal sling N/A 04/30/2013    Procedure: REMOVAL OF VAGINAL MESH;  Surgeon: Reece Packer, MD;  Location: [facility];  Service: Urology;  Laterality: N/A;  . Abdominal hysterectomy    . Hernia repair  ?1996    "stomach"  . Cardiac catheterization  05-22-2008   DR SKAINS    NO SIGNIFECANT CAD/ NORMAL LV/  EF 65%/  NO WALL MOTION ABNORMALITIES  . Cardiac catheterization  02/12/2014    Family History  Problem Relation Age of Onset  . Hypertension Mother   . Diabetes Mother   . Cancer - Other Mother   . Cancer - Other Father   . Heart attack Father     Social History:  reports that she has been  smoking Cigarettes and E-cigarettes.  She has a 41 pack-year smoking history. She has never used smokeless tobacco. She reports that she drinks alcohol. She reports that she does not use illicit drugs.  Review of Systems -   She has history of high triglycerides, last level was 280 and  was told to start taking fenofibrate  Hypertension:  patient is on ramipril 5 mg, HCTZ was stopped because of hypokalemia but she has not taken any Aldactone; again her blood pressure is significantly high   HYPOKALEMIA: Her potassium is  improved with starting ramipril. She is not having any cramps.   No recent edema    Examination:   BP 136/94  Pulse 93  Temp(Src) 97.8 F (36.6 C)  Resp 16  Ht _0  (1.626 m)  Wt 197 lb 6.4 oz (89.54 kg)  BMI 33.87 kg/m2  SpO2 97%  Body mass index is 33.87 kg/(m^2).   No ankle edema  Assesment/Plan:   1. Diabetes type 2, uncontrolled   The patient's diabetes control is improving with increasing her basal rate and she may be better compliant with her mealtime boluses also However difficult to assess her readings as she is not using the brand name monitor and probably not checking regularly Her diet is also not consistent and probably has periods of hyperglycemia especially with drinking regular soft drinks  poor overall and A1c is significantly high Recommendations made:  To check blood sugars more consistently with a FreeStyle monitor before and after meals, prescription for strips and  Reduce soft drinks  Take extra boluses for high readings rather than just waiting for mealtime coverage  Discussed site rotation for the insulin pump and she can go more laterally   2. Hypokalemia: She will resume Aldactone along with ramipril and probably will not need potassium supplements.     Blayze Haen 03/09/2014, 11:28 AM

## 2014-03-09 NOTE — Patient Instructions (Signed)
Please check blood sugars at least half the time about 2 hours after any meal and as directed on waking up. Please bring blood sugar monitor to each visit

## 2014-03-21 ENCOUNTER — Telehealth: Payer: Self-pay | Admitting: Neurology

## 2014-03-21 ENCOUNTER — Ambulatory Visit (INDEPENDENT_AMBULATORY_CARE_PROVIDER_SITE_OTHER): Payer: Medicare Other | Admitting: Endocrinology

## 2014-03-21 ENCOUNTER — Encounter: Payer: Self-pay | Admitting: Endocrinology

## 2014-03-21 VITALS — BP 100/60 | HR 106 | Temp 98.4°F | Resp 14 | Ht 64.0 in | Wt 195.6 lb

## 2014-03-21 DIAGNOSIS — T68XXXA Hypothermia, initial encounter: Secondary | ICD-10-CM

## 2014-03-21 DIAGNOSIS — D510 Vitamin B12 deficiency anemia due to intrinsic factor deficiency: Secondary | ICD-10-CM

## 2014-03-21 DIAGNOSIS — IMO0001 Reserved for inherently not codable concepts without codable children: Secondary | ICD-10-CM

## 2014-03-21 DIAGNOSIS — E1165 Type 2 diabetes mellitus with hyperglycemia: Secondary | ICD-10-CM

## 2014-03-21 DIAGNOSIS — I6350 Cerebral infarction due to unspecified occlusion or stenosis of unspecified cerebral artery: Secondary | ICD-10-CM

## 2014-03-21 DIAGNOSIS — E559 Vitamin D deficiency, unspecified: Secondary | ICD-10-CM

## 2014-03-21 DIAGNOSIS — E039 Hypothyroidism, unspecified: Secondary | ICD-10-CM

## 2014-03-21 DIAGNOSIS — E785 Hyperlipidemia, unspecified: Secondary | ICD-10-CM

## 2014-03-21 MED ORDER — LEVOTHYROXINE SODIUM 25 MCG PO TABS: 25.0000 ug | ORAL_TABLET | Freq: Every day | ORAL | Status: DC

## 2014-03-21 MED ORDER — ERGOCALCIFEROL 1.25 MG (50000 UT) PO CAPS: 50000.0000 [IU] | ORAL_CAPSULE | ORAL | Status: DC

## 2014-03-21 NOTE — Telephone Encounter (Signed)
Shanon Brow called concerning pt needing to get in sooner per pt's other Dr's. He states that she needs to be released from Dr. Leonie Man so that they can do more test on pt. Please call Shanon Brow concerning this matter. Thanks

## 2014-03-21 NOTE — Patient Instructions (Signed)
Stop Cytomel  Synthroid 25ug daily  Vitamin B12, take 1000ug daily  Vitamin D, weekly  Change insulin pump DAILY

## 2014-03-21 NOTE — Progress Notes (Signed)
Patient ID: [name], female   DOB: 1966-11-02, 48 y.o.   MRN: [id number]   Reason for Appointment: Reconsult from PCP   History of Present Illness:   Problem 1: Hypothermia. She had gone through her 57 office complaining of feeling excessively hot. However her fiance says that her skin was feeling cold. Apparently temperature was 94 in her physician's office and he gave her Cytomel empirically. She had low temperature gained about 2 days later but not since then. Temperature is normal today. The problem was discussed with her PCP on Monday Not clear if she has had this problem before. 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 Hypothyroidism. She has not had a previous history of thyroid disease. She had multiple labs done which showed a high total T3 level along with a TSH of 8.8 and normal free T4. Difficult to assess her symptoms since she tends to be fatigued from her other medical problems and depression This morning is not waking up because of hangover of her hypnotic medication and is not able to answer any questions  Problem 3:  Diabetes. Blood sugar has been typically poorly controlled because of noncompliance. Glucose in the lab recently was 275 and was high at home also yesterday. Her fianc said that she has been changing her insulin pump only every other day instead of daily and he was not aware of this. Only recently he has been helping her manage her diabetes and is now aware of need for boluses for high readings and mealtime coverage  Labs:  Vitamin D level is 10.3, vitamin B 12 level is 197  Potassium level was 4.6 and calcium level was 10.5     Medication List       This list is accurate as of: 03/21/14  8:33 AM.  Always use your most recent med list.               amitriptyline 150 MG tablet  Commonly known as:  ELAVIL  Take 150 mg by mouth at bedtime.     aspirin 325 MG tablet  Take 1 tablet (325 mg total) by  mouth daily.     atorvastatin 40 MG tablet  Commonly known as:  LIPITOR  Take 1 tablet (40 mg total) by mouth daily.     chlorproMAZINE 100 MG tablet  Commonly known as:  THORAZINE  Take 100 mg by mouth at bedtime.     ciprofloxacin 500 MG tablet  Commonly known as:  CIPRO     diazepam 10 MG tablet  Commonly known as:  VALIUM  Take 10 mg by mouth at bedtime.     glucose blood test strip  Commonly known as:  FREESTYLE LITE  Use as instructed to check blood sugar 2 times daily dx code 250.02     HYDROcodone-acetaminophen 10-325 MG per tablet  Commonly known as:  NORCO  Take 1 tablet by mouth every 4 (four) hours as needed for moderate pain.     INVOKANA 100 MG Tabs  Generic drug:  Canagliflozin  Take 100 mg by mouth daily.     lidocaine 2 % jelly  Commonly known as:  XYLOCAINE     lidocaine 5 % ointment  Commonly known as:  XYLOCAINE  Apply 1 application topically daily as needed.     liothyronine 25 MCG tablet  Commonly known as:  CYTOMEL     NEXIUM 40 MG capsule  Generic drug:  esomeprazole  NOVOLOG 100 UNIT/ML injection  Generic drug:  insulin aspart  TO USE WITH V-GO PUMP     oxyCODONE 5 MG immediate release tablet  Commonly known as:  Oxy IR/ROXICODONE     oxyCODONE-acetaminophen 10-325 MG per tablet  Commonly known as:  PERCOCET     promethazine 25 MG tablet  Commonly known as:  PHENERGAN     ramipril 5 MG capsule  Commonly known as:  ALTACE  Take 5 mg by mouth daily.     spironolactone 25 MG tablet  Commonly known as:  ALDACTONE  Take 1 tablet (25 mg total) by mouth daily.     V-GO 40 Kit  by Does not apply route.        Allergies:  Allergies  Allergen Reactions  . Latex Itching, Rash and Other (See Comments)    Pt states she cannot use condoms - cause an infection.  Use of latex on skin is okay.  Tape causes rash  . Ibuprofen Other (See Comments)    headaches  . Sweetening Enhancer [Flavoring Agent] Nausea And Vomiting    And  headaches  . Tetracyclines & Related Nausea And Vomiting    Yeast infections  . Trazodone And Nefazodone Other (See Comments)    Hallucinations   . Triazolam Other (See Comments)    hallucinations  . Aspartame And Phenylalanine Nausea And Vomiting    headaches    Past Medical History  Diagnosis Date  . Hypertension   . Hyperlipemia   . GERD (gastroesophageal reflux disease)   . Insomnia   . SUI (stress urinary incontinence, female) S/P SLING 12-29-2011  . SOB (shortness of breath) on exertion   . Bipolar disorder   . Neurogenic bladder   . Hypercholesterolemia   . Gastroparesis   . Ulcer   . MRSA (methicillin resistant Staphylococcus aureus)   . Chronic pain   . Edema   . Chest pain     a. 2008 Cath: normal cors;  b. 12/2013 Lexi CL: EF 47%, no ischemia/infarct.  . Anxiety   . Vertigo   . COPD (chronic obstructive pulmonary disease)   . Cholecystitis   . Hypersomnia   . Tobacco abuse   . Claudication     a. 12/2013 ABI's: R 0.97, L 0.94.  [name] Palpitations   . Anginal pain   . Pneumonia     "twice, I think" (02/12/2014)  . Sleep apnea     didn't tolerate cpap (02/12/2014)  . Type II diabetes mellitus   . History of blood transfusion 1986    "related to lost a child" (02/12/2014)  . H/O hiatal hernia   . TWSFKCLE(751.7)     "weekly" (02/12/2014)  . Stroke 02/12/2014    "eyesight is messed up; not steady on my feet" (02/12/2014)  . Arthritis     "qwhere" (02/12/2014)  . DJD (degenerative joint disease)   . Chronic back pain   . Depression   . Personality disorder     Past Surgical History  Procedure Laterality Date  . Knee arthroscopy w/ allograft impant Left     graft x 2  . Foot surgery Bilateral   . Anterior cervical decomp/discectomy fusion  2000    C6 - 7  . Pubovaginal sling  12/29/2011    Procedure: Gaynelle Arabian;  Surgeon: Reece Packer, MD;  Location: [facility];  Service: Urology;  Laterality: N/A;  cysto and sparc sling   .  Lumbar fusion      "  cage in my spine"  . Total abdominal hysterectomy w/ bilateral salpingoophorectomy  1997  . Multiple laparoscopies for endometriosis    . Cesarean section  1989; 1992  . Repeat reconstruction acl left knee/ screws removed  03-28-2000    CADAVER GRAFT  . 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 Knee surgery      TOTAL 8 SURG'S  . Cystoscopy with injection  05/04/2012    Procedure: CYSTOSCOPY WITH INJECTION;  Surgeon: Reece Packer, MD;  Location: [facility];  Service: Urology;  Laterality: N/A;  MACROPLASTIQUE INJECTION  . Cystoscopy with injection  08/28/2012    Procedure: CYSTOSCOPY WITH INJECTION;  Surgeon: Reece Packer, MD;  Location: [facility];  Service: Urology;  Laterality: N/A;  cysto and macroplastique   . Carpal tunnel release Right 04-25-2013  . Cysto N/A 04/30/2013    Procedure: CYSTOSCOPY;  Surgeon: Reece Packer, MD;  Location: [facility];  Service: Urology;  Laterality: N/A;  . Pubovaginal sling N/A 04/30/2013    Procedure: REMOVAL OF VAGINAL MESH;  Surgeon: Reece Packer, MD;  Location: [facility];  Service: Urology;  Laterality: N/A;  . Abdominal hysterectomy    . Hernia repair  ?1996    "stomach"  . Cardiac catheterization  05-22-2008   DR SKAINS    NO SIGNIFECANT CAD/ NORMAL LV/  EF 65%/  NO WALL MOTION ABNORMALITIES  . Cardiac catheterization  02/12/2014    Family History  Problem Relation Age of Onset  . Hypertension Mother   . Diabetes Mother   . Cancer - Other Mother   . Cancer - Other Father   . Heart attack Father     Social History:  reports that she has been smoking Cigarettes and E-cigarettes.  She has a 41 pack-year smoking history. She has never used smokeless tobacco. She reports that she drinks alcohol. She reports that she does not use illicit drugs.  Review of Systems -   She has history of high triglycerides, recent level was 346 and  was told to start taking  fenofibrate  Hypertension:  patient is on ramipril 5 mg, HCTZ was stopped because of hypokalemia but she has not taken any Aldactone Blood pressure is relatively lower today   HYPOKALEMIA: Her potassium is controlled with ramipril  No recent edema    Examination:   BP 100/60  Pulse 106  Temp(Src) 98.4 F (36.9 C)  Resp 14  Ht '5\' 4"'  (1.626 m)  Wt 195 lb 9.6 oz (88.724 kg)  BMI 33.56 kg/m2  SpO2 97%  Body mass index is 33.56 kg/(m^2).   She is somnolent today  Assesment/Plan:   1. Diabetes type 2, uncontrolled   She has significantly high readings from noncompliance with insulin pump She will start using this every day and her fianc was instructed on day-to-day management She needs to check her blood sugar daily and keep a record for review  2. Hypothermia: This is a side effect of her Thorazine and have asked her fianc to call the psychiatrist to change this drug. Reassured him that this is not a endocrine or central nervous system problem Temperature is normal today  3.? Hypothyroidism: Not able to explain the high TSH along with high T3 and normal free T4. Would think this is a lab error as her Cytomel was started after the lab was drawn. Because of high TSH was empirically started on levothyroxine 25 mcg daily  4. B12 deficiency and vitamin D  3 deficiency: She is being started on supplements, she prefers prescription vitamin D3 because of not being able to afford the OTC drug  Followup next month as scheduled.  Jerame Hedding 03/21/2014, 8:33 AM

## 2014-03-25 ENCOUNTER — Telehealth: Payer: Self-pay | Admitting: Endocrinology

## 2014-03-25 ENCOUNTER — Other Ambulatory Visit: Payer: Self-pay | Admitting: *Deleted

## 2014-03-25 MED ORDER — CANAGLIFLOZIN 100 MG PO TABS: 100.0000 mg | ORAL_TABLET | Freq: Every day | ORAL | Status: DC

## 2014-03-25 NOTE — Telephone Encounter (Signed)
Call in her invokana RX please she has none left at this time

## 2014-03-25 NOTE — Telephone Encounter (Signed)
Called pt and spoke with Shanon Brow, pt's significant other to make the pt an sooner appt on 03/26/14 with Dr. Leonie Man, because pt is having more problems and needed to be seen sooner per pt's other doctor. I advised Shanon Brow that if the pt has any other problems, questions or concerns to call the office. Pt verbalized understanding.

## 2014-03-25 NOTE — Telephone Encounter (Signed)
rx sent

## 2014-03-26 ENCOUNTER — Ambulatory Visit (INDEPENDENT_AMBULATORY_CARE_PROVIDER_SITE_OTHER): Payer: Medicare Other | Admitting: Neurology

## 2014-03-26 ENCOUNTER — Encounter: Payer: Self-pay | Admitting: Neurology

## 2014-03-26 VITALS — BP 151/91 | HR 91 | Ht 65.0 in | Wt 191.0 lb

## 2014-03-26 DIAGNOSIS — G43019 Migraine without aura, intractable, without status migrainosus: Secondary | ICD-10-CM

## 2014-03-26 DIAGNOSIS — I6350 Cerebral infarction due to unspecified occlusion or stenosis of unspecified cerebral artery: Secondary | ICD-10-CM

## 2014-03-26 MED ORDER — ELETRIPTAN HYDROBROMIDE 40 MG PO TABS: 40.0000 mg | ORAL_TABLET | ORAL | Status: DC | PRN

## 2014-03-26 MED ORDER — TOPIRAMATE 50 MG PO TABS: 50.0000 mg | ORAL_TABLET | Freq: Two times a day (BID) | ORAL | Status: DC

## 2014-03-26 NOTE — Progress Notes (Signed)
[facility] [address] [place]. [place] [address] [phone]       OFFICE FOLLOW-UP NOTE  [name] Date of Birth:  Nov 03, 1966 Medical Record Number:  [id number]   HPI: 48 year Caucasian lady seen for first office for visit followup hospital consultation for stroke and 5 at 54. She had cardiac catheterization on 02/12/14 and a few hours later developed sudden onset of diplopia when she woke up from a nap. She denied accompanying vertigo, numbness, dizziness. 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 MRI of the brain showed no large vessel occlusion. Transthoracic echo showed ejection fraction of 45% with hypokinesia of the inferior base and inferolateral walls. Urine drug screen was negative for. Carotid Doppler showed no significant circumflex stenosis. She was found to hyperlipidemia and started on Lipitor 40 mg. Hemoglobin A1c was elevated at 9.1. She'was started an aspirin for stroke prevention and did well and was discharged home. She states she has mildy diminished fine motor skills in the left hand and some numbness but otherwise has no residual deficits from stroke. She developed headaches after returning home initially in the occipital region but last 5 days she has daily headaches off and on in the temples. Headaches sensitive to light sound complained of nausea and occasional vomiting. An excellent disabling and member to work. She has been taking aspirin 625 milligrams which will ease the  headache but not completely perfect. She was carefully the mother. She does have a remote history of migraines and strong family history of migraines. She was  on amitriptyline 150 mg which was discontinued a month ago and perhaps may have contributed to her worseningt  migraines. She also complains of mild intermittent tremors in  her hands.    l. She plans to have an upper GI endoscopy  for reflux and is concerned about stopping aspirin for the procedure.  ROS:   14 system review of systems is positive for chills, fatigue, chest pain, hearing loss, trouble swallowing, itching, blurred vision double vision, loss of vision, eye pain, shortness of breath, cough, snoring, constipation, urination problems, feeling hot, cold and thirsty, joint pain, cramps, aching muscles, memory loss and confusion, headache, numbness, weakness, difficult swallowing, dizziness, tremor, anxiety, depression, not enough sleep, and presented, racing thoughts, snoring and insomnia.  PMH:  Past Medical History  Diagnosis Date  . Hypertension   . Hyperlipemia   . GERD (gastroesophageal reflux disease)   . Insomnia   . SUI (stress urinary incontinence, female) S/P SLING 12-29-2011  . SOB (shortness of breath) on exertion   . Bipolar disorder   . Neurogenic bladder   . Hypercholesterolemia   . Gastroparesis   . Ulcer   . MRSA (methicillin resistant Staphylococcus aureus)   . Chronic pain   . Edema   . Chest pain     a. 2008 Cath: normal cors;  b. 12/2013 Lexi CL: EF 47%, no ischemia/infarct.  . Anxiety   . Vertigo   . COPD (chronic obstructive pulmonary disease)   . Cholecystitis   . Hypersomnia   . Tobacco abuse   . Claudication     a. 12/2013 ABI's: R 0.97, L 0.94.  [name] Palpitations   . Anginal pain   . Pneumonia     "twice, I think" (02/12/2014)  . Sleep apnea     didn't tolerate cpap (02/12/2014)  . Type II diabetes mellitus   . History of  blood transfusion 1986    "related to lost a child" (02/12/2014)  . H/O hiatal hernia   . LJQGBEEF(007.1)     "weekly" (02/12/2014)  . Stroke 02/12/2014    "eyesight is messed up; not steady on my feet" (02/12/2014)  . Arthritis     "qwhere" (02/12/2014)  . DJD (degenerative joint disease)   . Chronic back pain   . Depression   . Personality disorder     Social History:  History   Social History  . Marital Status: Divorced    Spouse Name: N/A      Number of Children: N/A  . Years of Education: N/A   Occupational History  . Not on file.   Social History Main Topics  . Smoking status: Current Every Day Smoker -- 1.00 packs/day for 41 years    Types: Cigarettes, E-cigarettes  . Smokeless tobacco: Never Used  . Alcohol Use: 0.0 oz/week     Comment: 02/12/2014 "might have a mixed drink on special occasions"  . Drug Use: No  . Sexual Activity: Yes   Other Topics Concern  . Not on file   Social History Narrative   Lives in [place] with boyfriend.  Does not routinely exercise.    Medications:   Current Outpatient Prescriptions on File Prior to Visit  Medication Sig Dispense Refill  . aspirin 325 MG tablet Take 1 tablet (325 mg total) by mouth daily.      [name] atorvastatin (LIPITOR) 40 MG tablet Take 1 tablet (40 mg total) by mouth daily.  30 tablet  3  . Canagliflozin (INVOKANA) 100 MG TABS Take 1 tablet (100 mg total) by mouth daily.  30 tablet  5  . chlorproMAZINE (THORAZINE) 100 MG tablet Take 100 mg by mouth at bedtime.       . ciprofloxacin (CIPRO) 500 MG tablet       . diazepam (VALIUM) 10 MG tablet Take 10 mg by mouth at bedtime.      . ergocalciferol (VITAMIN D2) 50000 UNITS capsule Take 1 capsule (50,000 Units total) by mouth once a week.  4 capsule  12  . glucose blood (FREESTYLE LITE) test strip Use as instructed to check blood sugar 2 times daily dx code 250.02  100 each  3  . HYDROcodone-acetaminophen (NORCO) 10-325 MG per tablet Take 1 tablet by mouth every 4 (four) hours as needed for moderate pain.      [name] insulin aspart (NOVOLOG) 100 UNIT/ML injection TO USE WITH V-GO PUMP      . Insulin Disposable Pump (V-GO 40) KIT by Does not apply route.      [name] levothyroxine (SYNTHROID) 25 MCG tablet Take 1 tablet (25 mcg total) by mouth daily before breakfast.  30 tablet  2  . lidocaine (XYLOCAINE) 5 % ointment Apply 1 application topically daily as needed.      [name] NEXIUM 40 MG capsule       . oxyCODONE (OXY IR/ROXICODONE) 5 MG  immediate release tablet       . oxyCODONE-acetaminophen (PERCOCET) 10-325 MG per tablet       . promethazine (PHENERGAN) 25 MG tablet       . ramipril (ALTACE) 5 MG capsule Take 5 mg by mouth daily.      [name] spironolactone (ALDACTONE) 25 MG tablet Take 1 tablet (25 mg total) by mouth daily.  30 tablet  2   No current facility-administered medications on file prior to visit.    Allergies:   Allergies  Allergen  Reactions  . Latex Itching, Rash and Other (See Comments)    Pt states she cannot use condoms - cause an infection.  Use of latex on skin is okay.  Tape causes rash  . Ibuprofen Other (See Comments)    headaches  . Sweetening Enhancer [Flavoring Agent] Nausea And Vomiting    And headaches  . Tetracyclines & Related Nausea And Vomiting    Yeast infections  . Trazodone And Nefazodone Other (See Comments)    Hallucinations   . Triazolam Other (See Comments)    hallucinations  . Aspartame And Phenylalanine Nausea And Vomiting    headaches    Physical Exam General: well developed, well nourished, seated, in no evident distress Head: head normocephalic and atraumatic. Orohparynx benign Neck: supple with no carotid or supraclavicular bruits Cardiovascular: regular rate and rhythm, no murmurs Musculoskeletal: no deformity Skin:  no rash/petichiae Vascular:  Normal pulses all extremities Filed Vitals:   03/26/14 1534  BP: 151/91  Pulse: 91   Neurologic Exam Mental Status: Awake and fully alert. Oriented to place and time. Recent and remote memory intact. Attention span, concentration and fund of knowledge appropriate. Mood and affect appropriate.  Cranial Nerves: Fundoscopic exam reveals sharp disc margins. Pupils equal, briskly reactive to light. Extraocular movements full without nystagmus. Visual fields full to confrontation. Hearing intact. Facial sensation intact. Face, tongue, palate moves normally and symmetrically.  Motor: Normal bulk and tone. Normal strength in all  tested extremity muscles.Marland Kitchendiminsihed fine finger movements on left. No resting, or cogwheel rigidity. Fine intermittent action tremor of left hand more than right. No bradykinesia. Glabellar tap is negative. Sensory.: subjective diminished  touch and pinprick  on left and normal and vibratory sensation.  Coordination: Rapid alternating movements normal in all extremities. Finger-to-nose and heel-to-shin performed accurately bilaterally. Gait and Station: Arises from chair without difficulty. Stance is normal. Gait demonstrates normal stride length and balance . Able to heel, toe and tandem walk without difficulty.  Reflexes: 1+ and symmetric. Toes downgoing.   NIHSS  1 Modified Rankin  1   ASSESSMENT: 1 year Caucasian lady who with bilateral embolic infarcts following cardiac catheterization in February 2015 with multiple vascular risk factors of hypertension, hyperlipidemia, diabetes, sleep apnea, heart failure and tobacco abuse.Refractory migraine headaches    PLAN: I had a long discussion with the patient and her fianc discussed results of her hospital evaluation, migraine headaches and treatment options and answered questions. I gave her samples of Relpax 40 mg to be used for symptomatic relief and her prescription to fill if this is effective. Also start Topamax 50 mg at night for one week increase to twice daily if tolerated. She was advised to return for followup in 2 months with Lynn,NO or call earlier if needed    Note: This document was prepared with digital dictation and possible smart phrase technology. Any transcriptional errors that result from this process are unintentional

## 2014-03-26 NOTE — Patient Instructions (Addendum)
I had a long discussion with the patient and [name] fianc discussed results of [name] hospital evaluation, migraine headaches and treatment options and answered questions. I gave [name] samples of Relpax 40 mg to be used for symptomatic relief and [name] prescription to fill if this is effective. Also start Topamax 50 mg at night for one week increase to twice daily if tolerated. She was advised to return for followup in 2 months with [name],NO or call earlier if needed  Migraine Headache A migraine headache is an intense, throbbing pain on one or both sides of your head. A migraine can last for 30 minutes to several hours. CAUSES  The exact cause of a migraine headache is not always known. However, a migraine may be caused when nerves in the brain become irritated and release chemicals that cause inflammation. This causes pain. Certain things may also trigger migraines, such as:  Alcohol.  Smoking.  Stress.  Menstruation.  Aged cheeses.  Foods or drinks that contain nitrates, glutamate, aspartame, or tyramine.  Lack of sleep.  Chocolate.  Caffeine.  Hunger.  Physical exertion.  Fatigue.  Medicines used to treat chest pain (nitroglycerine), birth control pills, estrogen, and some blood pressure medicines. SIGNS AND SYMPTOMS  Pain on one or both sides of your head.  Pulsating or throbbing pain.  Severe pain that prevents daily activities.  Pain that is aggravated by any physical activity.  Nausea, vomiting, or both.  Dizziness.  Pain with exposure to bright lights, loud noises, or activity.  General sensitivity to bright lights, loud noises, or smells. Before you get a migraine, you may get warning signs that a migraine is coming (aura). An aura may include:  Seeing flashing lights.  Seeing bright spots, halos, or zig-zag lines.  Having tunnel vision or blurred vision.  Having feelings of numbness or tingling.  Having trouble talking.  Having muscle  weakness. DIAGNOSIS  A migraine headache is often diagnosed based on:  Symptoms.  Physical exam.  A CT scan or MRI of your head. These imaging tests cannot diagnose migraines, but they can help rule out other causes of headaches. TREATMENT Medicines may be given for pain and nausea. Medicines can also be given to help prevent recurrent migraines.  HOME CARE INSTRUCTIONS  Only take over-the-counter or prescription medicines for pain or discomfort as directed by your health care provider. The use of long-term narcotics is not recommended.  Lie down in a dark, quiet room when you have a migraine.  Keep a journal to find out what may trigger your migraine headaches. For example, write down:  What you eat and drink.  How much sleep you get.  Any change to your diet or medicines.  Limit alcohol consumption.  Quit smoking if you smoke.  Get 7 9 hours of sleep, or as recommended by your health care provider.  Limit stress.  Keep lights dim if bright lights bother you and make your migraines worse. SEEK IMMEDIATE MEDICAL CARE IF:   Your migraine becomes severe.  You have a fever.  You have a stiff neck.  You have vision loss.  You have muscular weakness or loss of muscle control.  You start losing your balance or have trouble walking.  You feel faint or pass out.  You have severe symptoms that are different from your first symptoms. MAKE SURE YOU:   Understand these instructions.  Will watch your condition.  Will get help right away if you are not doing well or get worse. Document  Released: 12/12/2005 Document Revised: 10/02/2013 Document Reviewed: 08/19/2013 [facility] Patient Information 2014 [place].   Stroke Prevention Some medical conditions and behaviors are associated with an increased chance of having a stroke. You may prevent a stroke by making healthy choices and managing medical conditions. HOW CAN I REDUCE MY RISK OF HAVING A STROKE?   Stay  physically active. Get at least 30 minutes of activity on most or all days.  Do not smoke. It may also be helpful to avoid exposure to secondhand smoke.  Limit alcohol use. Moderate alcohol use is considered to be:  No more than 2 drinks per day for men.  No more than 1 drink per day for nonpregnant women.  Eat healthy foods. This involves  Eating 5 or more servings of fruits and vegetables a day.  Following a diet that addresses high blood pressure (hypertension), high cholesterol, diabetes, or obesity.  Manage your cholesterol levels.  A diet low in saturated fat, trans fat, and cholesterol and high in fiber may control cholesterol levels.  Take any prescribed medicines to control cholesterol as directed by your health care provider.  Manage your diabetes.  A controlled-carbohydrate, controlled-sugar diet is recommended to manage diabetes.  Take any prescribed medicines to control diabetes as directed by your health care provider.  Control your hypertension.  A low-salt (sodium), low-saturated fat, low-trans fat, and low-cholesterol diet is recommended to manage hypertension.  Take any prescribed medicines to control hypertension as directed by your health care provider.  Maintain a healthy weight.  A reduced-calorie, low-sodium, low-saturated fat, low-trans fat, low-cholesterol diet is recommended to manage weight.  Stop drug abuse.  Avoid taking birth control pills.  Talk to your health care provider about the risks of taking birth control pills if you are over 72 years old, smoke, get migraines, or have ever had a blood clot.  Get evaluated for sleep disorders (sleep apnea).  Talk to your health care provider about getting a sleep evaluation if you snore a lot or have excessive sleepiness.  Take medicines as directed by your health care provider.  For some people, aspirin or blood thinners (anticoagulants) are helpful in reducing the risk of forming abnormal  blood clots that can lead to stroke. If you have the irregular heart rhythm of atrial fibrillation, you should be on a blood thinner unless there is a good reason you cannot take them.  Understand all your medicine instructions.  Make sure that other other conditions (such as anemia or atherosclerosis) are addressed. SEEK IMMEDIATE MEDICAL CARE IF:   You have sudden weakness or numbness of the face, arm, or leg, especially on one side of the body.  Your face or eyelid droops to one side.  You have sudden confusion.  You have trouble speaking (aphasia) or understanding.  You have sudden trouble seeing in one or both eyes.  You have sudden trouble walking.  You have dizziness.  You have a loss of balance or coordination.  You have a sudden, severe headache with no known cause.  You have new chest pain or an irregular heartbeat. Any of these symptoms may represent a serious problem that is an emergency. Do not wait to see if the symptoms will go away. Get medical help at once. Call your local emergency services  (911 in [place] Do not drive yourself to the hospital. Document Released: 01/19/2005 Document Revised: 10/02/2013 Document Reviewed: 06/14/2013 [facility] Patient Information 2014 [place].

## 2014-04-04 ENCOUNTER — Other Ambulatory Visit (INDEPENDENT_AMBULATORY_CARE_PROVIDER_SITE_OTHER): Payer: Medicare Other

## 2014-04-04 DIAGNOSIS — E039 Hypothyroidism, unspecified: Secondary | ICD-10-CM

## 2014-04-04 DIAGNOSIS — E1165 Type 2 diabetes mellitus with hyperglycemia: Principal | ICD-10-CM

## 2014-04-04 DIAGNOSIS — D510 Vitamin B12 deficiency anemia due to intrinsic factor deficiency: Secondary | ICD-10-CM

## 2014-04-04 DIAGNOSIS — IMO0001 Reserved for inherently not codable concepts without codable children: Secondary | ICD-10-CM

## 2014-04-04 DIAGNOSIS — E785 Hyperlipidemia, unspecified: Secondary | ICD-10-CM

## 2014-04-04 LAB — COMPREHENSIVE METABOLIC PANEL
ALT: 13 U/L (ref 0–35)
AST: 15 U/L (ref 0–37)
Albumin: 3.6 g/dL (ref 3.5–5.2)
Alkaline Phosphatase: 71 U/L (ref 39–117)
BILIRUBIN TOTAL: 0.4 mg/dL (ref 0.3–1.2)
BUN: 12 mg/dL (ref 6–23)
CALCIUM: 9.1 mg/dL (ref 8.4–10.5)
CHLORIDE: 101 meq/L (ref 96–112)
CO2: 20 meq/L (ref 19–32)
Creatinine, Ser: 0.9 mg/dL (ref 0.4–1.2)
GFR: 72.63 mL/min (ref >60.00)
Glucose, Bld: 388 mg/dL — ABNORMAL HIGH (ref 70–99)
Potassium: 4 mEq/L (ref 3.5–5.1)
Sodium: 133 mEq/L — ABNORMAL LOW (ref 135–145)
Total Protein: 6.6 g/dL (ref 6.0–8.3)

## 2014-04-04 LAB — LIPID PANEL
Cholesterol: 99 mg/dL (ref 0–200)
HDL: 33.4 mg/dL — AB (ref >39.00)
LDL Cholesterol: 3 mg/dL (ref 0–99)
TRIGLYCERIDES: 314 mg/dL — AB (ref 0.0–149.0)
Total CHOL/HDL Ratio: 3
VLDL: 62.8 mg/dL — AB (ref 0.0–40.0)

## 2014-04-04 LAB — MICROALBUMIN / CREATININE URINE RATIO
Creatinine,U: 52.7 mg/dL
Microalb Creat Ratio: 1.9 mg/g (ref 0.0–30.0)
Microalb, Ur: 1 mg/dL (ref 0.0–1.9)

## 2014-04-04 LAB — TSH: TSH: 0.21 u[IU]/mL — AB (ref 0.35–5.50)

## 2014-04-04 LAB — T4, FREE: FREE T4: 0.92 ng/dL (ref 0.60–1.60)

## 2014-04-04 LAB — CBC
HEMATOCRIT: 38.9 % (ref 36.0–46.0)
Hemoglobin: 13.2 g/dL (ref 12.0–15.0)
MCHC: 33.8 g/dL (ref 30.0–36.0)
MCV: 91.5 fl (ref 78.0–100.0)
Platelets: 233 10*3/uL (ref 150.0–400.0)
RBC: 4.25 Mil/uL (ref 3.87–5.11)
RDW: 13.5 % (ref 11.5–14.6)
WBC: 13 10*3/uL — ABNORMAL HIGH (ref 4.5–10.5)

## 2014-04-04 LAB — HEMOGLOBIN A1C: Hgb A1c MFr Bld: 9.5 % — ABNORMAL HIGH (ref 4.6–6.5)

## 2014-04-05 ENCOUNTER — Encounter (HOSPITAL_COMMUNITY): Payer: Self-pay | Admitting: Emergency Medicine

## 2014-04-05 ENCOUNTER — Emergency Department (HOSPITAL_COMMUNITY)
Admission: EM | Admit: 2014-04-05 | Discharge: 2014-04-06 | Disposition: A | Discharge status: Home / Self Care | Payer: Medicare Other | Attending: Emergency Medicine | Admitting: Emergency Medicine

## 2014-04-05 DIAGNOSIS — Z91040 Latex allergy status: Secondary | ICD-10-CM | POA: Insufficient documentation

## 2014-04-05 DIAGNOSIS — Z8701 Personal history of pneumonia (recurrent): Secondary | ICD-10-CM | POA: Insufficient documentation

## 2014-04-05 DIAGNOSIS — M1990 Unspecified osteoarthritis, unspecified site: Secondary | ICD-10-CM | POA: Insufficient documentation

## 2014-04-05 DIAGNOSIS — G4710 Hypersomnia, unspecified: Secondary | ICD-10-CM | POA: Insufficient documentation

## 2014-04-05 DIAGNOSIS — Z7982 Long term (current) use of aspirin: Secondary | ICD-10-CM | POA: Insufficient documentation

## 2014-04-05 DIAGNOSIS — J4489 Other specified chronic obstructive pulmonary disease: Secondary | ICD-10-CM | POA: Insufficient documentation

## 2014-04-05 DIAGNOSIS — F319 Bipolar disorder, unspecified: Secondary | ICD-10-CM | POA: Insufficient documentation

## 2014-04-05 DIAGNOSIS — I209 Angina pectoris, unspecified: Secondary | ICD-10-CM | POA: Insufficient documentation

## 2014-04-05 DIAGNOSIS — Z8673 Personal history of transient ischemic attack (TIA), and cerebral infarction without residual deficits: Secondary | ICD-10-CM | POA: Insufficient documentation

## 2014-04-05 DIAGNOSIS — Z79899 Other long term (current) drug therapy: Secondary | ICD-10-CM | POA: Insufficient documentation

## 2014-04-05 DIAGNOSIS — J449 Chronic obstructive pulmonary disease, unspecified: Secondary | ICD-10-CM | POA: Insufficient documentation

## 2014-04-05 DIAGNOSIS — I10 Essential (primary) hypertension: Secondary | ICD-10-CM | POA: Insufficient documentation

## 2014-04-05 DIAGNOSIS — Z8614 Personal history of Methicillin resistant Staphylococcus aureus infection: Secondary | ICD-10-CM | POA: Insufficient documentation

## 2014-04-05 DIAGNOSIS — F17200 Nicotine dependence, unspecified, uncomplicated: Secondary | ICD-10-CM | POA: Insufficient documentation

## 2014-04-05 DIAGNOSIS — Z87448 Personal history of other diseases of urinary system: Secondary | ICD-10-CM | POA: Insufficient documentation

## 2014-04-05 DIAGNOSIS — E7800 Pure hypercholesterolemia, unspecified: Secondary | ICD-10-CM | POA: Insufficient documentation

## 2014-04-05 DIAGNOSIS — G8929 Other chronic pain: Secondary | ICD-10-CM | POA: Insufficient documentation

## 2014-04-05 DIAGNOSIS — Z794 Long term (current) use of insulin: Secondary | ICD-10-CM | POA: Insufficient documentation

## 2014-04-05 DIAGNOSIS — G4700 Insomnia, unspecified: Secondary | ICD-10-CM | POA: Insufficient documentation

## 2014-04-05 DIAGNOSIS — F411 Generalized anxiety disorder: Secondary | ICD-10-CM | POA: Insufficient documentation

## 2014-04-05 DIAGNOSIS — G43909 Migraine, unspecified, not intractable, without status migrainosus: Secondary | ICD-10-CM

## 2014-04-05 DIAGNOSIS — E785 Hyperlipidemia, unspecified: Secondary | ICD-10-CM | POA: Insufficient documentation

## 2014-04-05 DIAGNOSIS — E119 Type 2 diabetes mellitus without complications: Secondary | ICD-10-CM | POA: Insufficient documentation

## 2014-04-05 DIAGNOSIS — Z8742 Personal history of other diseases of the female genital tract: Secondary | ICD-10-CM | POA: Insufficient documentation

## 2014-04-05 DIAGNOSIS — Z872 Personal history of diseases of the skin and subcutaneous tissue: Secondary | ICD-10-CM | POA: Insufficient documentation

## 2014-04-05 LAB — BASIC METABOLIC PANEL
BUN: 10 mg/dL (ref 6–23)
CALCIUM: 10.2 mg/dL (ref 8.4–10.5)
CO2: 20 mEq/L (ref 19–32)
Chloride: 98 mEq/L (ref 96–112)
Creatinine, Ser: 0.66 mg/dL (ref 0.50–1.10)
GFR calc Af Amer: >90 mL/min (ref >90)
GLUCOSE: 323 mg/dL — AB (ref 70–99)
POTASSIUM: 4 meq/L (ref 3.7–5.3)
SODIUM: 135 meq/L — AB (ref 137–147)

## 2014-04-05 LAB — CBC
HCT: 41.9 % (ref 36.0–46.0)
HEMOGLOBIN: 15 g/dL (ref 12.0–15.0)
MCH: 31.7 pg (ref 26.0–34.0)
MCHC: 35.8 g/dL (ref 30.0–36.0)
MCV: 88.6 fL (ref 78.0–100.0)
PLATELETS: 268 10*3/uL (ref 150–400)
RBC: 4.73 MIL/uL (ref 3.87–5.11)
RDW: 12.9 % (ref 11.5–15.5)
WBC: 16.4 10*3/uL — ABNORMAL HIGH (ref 4.0–10.5)

## 2014-04-05 LAB — I-STAT CHEM 8, ED
BUN: 8 mg/dL (ref 6–23)
CREATININE: 0.8 mg/dL (ref 0.50–1.10)
Calcium, Ion: 1.31 mmol/L — ABNORMAL HIGH (ref 1.12–1.23)
Chloride: 100 mEq/L (ref 96–112)
Glucose, Bld: 324 mg/dL — ABNORMAL HIGH (ref 70–99)
HCT: 45 % (ref 36.0–46.0)
HEMOGLOBIN: 15.3 g/dL — AB (ref 12.0–15.0)
POTASSIUM: 3.9 meq/L (ref 3.7–5.3)
SODIUM: 136 meq/L — AB (ref 137–147)
TCO2: 21 mmol/L (ref 0–100)

## 2014-04-05 MED ORDER — PROCHLORPERAZINE EDISYLATE 5 MG/ML IJ SOLN: 10.0000 mg | Freq: Four times a day (QID) | INTRAMUSCULAR | Status: DC | PRN

## 2014-04-05 MED ORDER — ONDANSETRON HCL 4 MG/2ML IJ SOLN
4.0000 mg | Freq: Once | INTRAMUSCULAR | Status: AC
  Administered 2014-04-06: 4 mg via INTRAVENOUS
  Filled 2014-04-05: qty 2

## 2014-04-05 MED ORDER — MORPHINE SULFATE 4 MG/ML IJ SOLN
6.0000 mg | Freq: Once | INTRAMUSCULAR | Status: AC
  Administered 2014-04-06: 6 mg via INTRAVENOUS
  Filled 2014-04-05: qty 2

## 2014-04-05 MED ORDER — SODIUM CHLORIDE 0.9 % IV SOLN
1000.0000 mL | Freq: Once | INTRAVENOUS | Status: AC
  Administered 2014-04-06: 1000 mL via INTRAVENOUS

## 2014-04-05 MED ORDER — SODIUM CHLORIDE 0.9 % IV SOLN
1000.0000 mL | INTRAVENOUS | Status: DC
  Administered 2014-04-06: 1000 mL via INTRAVENOUS

## 2014-04-05 NOTE — ED Notes (Addendum)
Patient with headache for last two weeks, progressively getting worse.  Patient is photophobic and noise sensitive. Patient has taken home meds without relief.  Patient has had a recent stroke.  Patient with equal hand grips, no slurred speech but she is feeling off with her gait.

## 2014-04-06 ENCOUNTER — Emergency Department (HOSPITAL_COMMUNITY): Payer: Medicare Other

## 2014-04-06 MED ORDER — MORPHINE SULFATE 4 MG/ML IJ SOLN
4.0000 mg | Freq: Once | INTRAMUSCULAR | Status: AC
  Administered 2014-04-06: 4 mg via INTRAVENOUS
  Filled 2014-04-06: qty 1

## 2014-04-06 NOTE — ED Provider Notes (Signed)
CSN: [id number]     Arrival date & time 04/05/14  1951 History   First MD Initiated Contact with Patient 04/05/14 2349     Chief Complaint  Patient presents with  . Headache      HPI Patient reports worsening headache over the past 2 weeks.  His frontal location.  Seems to be worsening.  She has photophobia and phonophobia.  She's tried home medications without improvement in her symptoms which include Percocet and Topamax.  She denies weakness of her arms or legs.  She did have a stroke 2 months ago at which point she began having headaches after her diagnosis of stroke.  She does have a history of prior headaches but did not have them for several decades.   Pain is mild to moderate in severity.  Past Medical History  Diagnosis Date  . Hypertension   . Hyperlipemia   . GERD (gastroesophageal reflux disease)   . Insomnia   . SUI (stress urinary incontinence, female) S/P SLING 12-29-2011  . SOB (shortness of breath) on exertion   . Bipolar disorder   . Neurogenic bladder   . Hypercholesterolemia   . Gastroparesis   . Ulcer   . MRSA (methicillin resistant Staphylococcus aureus)   . Chronic pain   . Edema   . Chest pain     a. 2008 Cath: normal cors;  b. 12/2013 Lexi CL: EF 47%, no ischemia/infarct.  . Anxiety   . Vertigo   . COPD (chronic obstructive pulmonary disease)   . Cholecystitis   . Hypersomnia   . Tobacco abuse   . Claudication     a. 12/2013 ABI's: R 0.97, L 0.94.  [name] Palpitations   . Anginal pain   . Pneumonia     "twice, I think" (02/12/2014)  . Sleep apnea     didn't tolerate cpap (02/12/2014)  . Type II diabetes mellitus   . History of blood transfusion 1986    "related to lost a child" (02/12/2014)  . H/O hiatal hernia   . WUJWJXBJ(478.2)     "weekly" (02/12/2014)  . Stroke 02/12/2014    "eyesight is messed up; not steady on my feet" (02/12/2014)  . Arthritis     "qwhere" (02/12/2014)  . DJD (degenerative joint disease)   . Chronic back pain   . Depression    . Personality disorder    Past Surgical History  Procedure Laterality Date  . Knee arthroscopy w/ allograft impant Left     graft x 2  . Foot surgery Bilateral   . Anterior cervical decomp/discectomy fusion  2000    C6 - 7  . Pubovaginal sling  12/29/2011    Procedure: Gaynelle Arabian;  Surgeon: Reece Packer, MD;  Location: [facility];  Service: Urology;  Laterality: N/A;  cysto and sparc sling   . Lumbar fusion      "cage in my spine"  . Total abdominal hysterectomy w/ bilateral salpingoophorectomy  1997  . Multiple laparoscopies for endometriosis    . Cesarean section  1989; 1992  . Repeat reconstruction acl left knee/ screws removed  03-28-2000    CADAVER GRAFT  . 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 Knee surgery      TOTAL 8 SURG'S  . Cystoscopy with injection  05/04/2012    Procedure: CYSTOSCOPY WITH INJECTION;  Surgeon: Reece Packer, MD;  Location: [facility];  Service: Urology;  Laterality: N/A;  MACROPLASTIQUE INJECTION  . Cystoscopy with  injection  08/28/2012    Procedure: CYSTOSCOPY WITH INJECTION;  Surgeon: Reece Packer, MD;  Location: [facility];  Service: Urology;  Laterality: N/A;  cysto and macroplastique   . Carpal tunnel release Right 04-25-2013  . Cysto N/A 04/30/2013    Procedure: CYSTOSCOPY;  Surgeon: Reece Packer, MD;  Location: [facility];  Service: Urology;  Laterality: N/A;  . Pubovaginal sling N/A 04/30/2013    Procedure: REMOVAL OF VAGINAL MESH;  Surgeon: Reece Packer, MD;  Location: [facility];  Service: Urology;  Laterality: N/A;  . Abdominal hysterectomy    . Hernia repair  ?1996    "stomach"  . Cardiac catheterization  05-22-2008   DR SKAINS    NO SIGNIFECANT CAD/ NORMAL LV/  EF 65%/  NO WALL MOTION ABNORMALITIES  . Cardiac catheterization  02/12/2014   Family History  Problem Relation Age of Onset  . Hypertension Mother   . Diabetes Mother   . Cancer - Other Mother    . Cancer - Other Father   . Heart attack Father    History  Substance Use Topics  . Smoking status: Current Every Day Smoker -- 1.00 packs/day for 41 years    Types: Cigarettes, E-cigarettes  . Smokeless tobacco: Never Used  . Alcohol Use: 0.0 oz/week     Comment: 02/12/2014 "might have a mixed drink on special occasions"   OB History   Grav Para Term Preterm Abortions TAB SAB Ect Mult Living                 Review of Systems  All other systems reviewed and are negative.     Allergies  Latex; Ibuprofen; Sweetening enhancer; Tetracyclines & related; Trazodone and nefazodone; Triazolam; and Aspartame and phenylalanine  Home Medications   Current Outpatient Rx  Name  Route  Sig  Dispense  Refill  . aspirin 325 MG tablet   Oral   Take 1 tablet (325 mg total) by mouth daily.         [name] atorvastatin (LIPITOR) 40 MG tablet   Oral   Take 1 tablet (40 mg total) by mouth daily.   30 tablet   3   . Canagliflozin (INVOKANA) 100 MG TABS   Oral   Take 1 tablet (100 mg total) by mouth daily.   30 tablet   5   . chlorproMAZINE (THORAZINE) 100 MG tablet   Oral   Take 100-200 mg by mouth at bedtime.         . diazepam (VALIUM) 10 MG tablet   Oral   Take 10 mg by mouth 3 (three) times daily as needed for anxiety.         [name] eletriptan (RELPAX) 40 MG tablet   Oral   Take 1 tablet (40 mg total) by mouth as needed for migraine or headache. One tablet by mouth at onset of headache. May repeat in 2 hours if headache persists or recurs.   10 tablet   0   . esomeprazole (NEXIUM) 40 MG capsule   Oral   Take 40 mg by mouth 2 (two) times daily before a meal.         . insulin aspart (NOVOLOG) 100 UNIT/ML injection      TO USE WITH V-GO PUMP         . Insulin Disposable Pump (V-GO 40) KIT   Does not apply   by Does not apply route.         [name]  Krill Oil 300 MG CAPS   Oral   Take 3 capsules by mouth daily.         [name] levothyroxine (SYNTHROID) 25 MCG tablet    Oral   Take 1 tablet (25 mcg total) by mouth daily before breakfast.   30 tablet   2   . lidocaine (LIDODERM) 5 %   Transdermal   Place 1 patch onto the skin daily as needed (for feet pain). Remove & Discard patch within 12 hours or as directed by MD         . lidocaine (XYLOCAINE) 5 % ointment   Topical   Apply 1 application topically as needed for moderate pain.         . magnesium oxide (MAG-OX) 400 MG tablet   Oral   Take 400 mg by mouth daily.         [name] oxyCODONE-acetaminophen (PERCOCET) 10-325 MG per tablet   Oral   Take 1 tablet by mouth 3 (three) times daily as needed for pain.         [name] oxyCODONE-acetaminophen (PERCOCET/ROXICET) 5-325 MG per tablet   Oral   Take 1 tablet by mouth 3 (three) times daily as needed for severe pain.         . promethazine (PHENERGAN) 25 MG tablet   Oral   Take 25 mg by mouth every 6 (six) hours as needed for nausea or vomiting.         . ramipril (ALTACE) 5 MG capsule   Oral   Take 5 mg by mouth daily.         [name] spironolactone (ALDACTONE) 25 MG tablet   Oral   Take 1 tablet (25 mg total) by mouth daily.   30 tablet   2   . topiramate (TOPAMAX) 50 MG tablet   Oral   Take 1 tablet (50 mg total) by mouth 2 (two) times daily.   60 tablet   1     Start one tablet at night x 1 week then twice dail ...   . Vitamin D, Ergocalciferol, (DRISDOL) 50000 UNITS CAPS capsule   Oral   Take 50,000 Units by mouth every 7 (seven) days. Take on Sunday         . glucose blood (FREESTYLE LITE) test strip      Use as instructed to check blood sugar 2 times daily dx code 250.02   100 each   3    BP 133/70  Pulse 93  Temp(Src) 97.8 F (36.6 C) (Oral)  Resp 18  Ht _0  (1.651 m)  Wt 195 lb (88.451 kg)  BMI 32.45 kg/m2  SpO2 99% Physical Exam  Nursing note and vitals reviewed. Constitutional: She is oriented to person, place, and time. She appears well-developed and well-nourished. No distress.  HENT:  Head:  Normocephalic and atraumatic.  Eyes: EOM are normal. Pupils are equal, round, and reactive to light.  Neck: Normal range of motion.  Cardiovascular: Normal rate, regular rhythm and normal heart sounds.   Pulmonary/Chest: Effort normal and breath sounds normal.  Abdominal: Soft. She exhibits no distension. There is no tenderness.  Musculoskeletal: Normal range of motion.  Neurological: She is alert and oriented to person, place, and time.  5/5 strength in major muscle groups of  bilateral upper and lower extremities. Speech normal. No facial asymetry.   Skin: Skin is warm and dry.  Psychiatric: She has a normal mood and affect. Judgment normal.    ED Course  Procedures (including critical care time) Labs Review Labs Reviewed  BASIC METABOLIC PANEL - Abnormal; Notable for the following:    Sodium 135 (*)    Glucose, Bld 323 (*)    All other components within normal limits  CBC - Abnormal; Notable for the following:    WBC 16.4 (*)    All other components within normal limits  I-STAT CHEM 8, ED - Abnormal; Notable for the following:    Sodium 136 (*)    Glucose, Bld 324 (*)    Calcium, Ion 1.31 (*)    Hemoglobin 15.3 (*)    All other components within normal limits   Imaging Review Ct Head Wo Contrast  04/06/2014   CLINICAL DATA:  Headaches  EXAM: CT HEAD WITHOUT CONTRAST  TECHNIQUE: Contiguous axial images were obtained from the base of the skull through the vertex without intravenous contrast.  COMPARISON:  MR 02/13/2014.  FINDINGS: No acute cortical infarct, hemorrhage, or mass lesion ispresent. The tiny subcortical infarcts identified on MR from 02/13/2014 are not well seen on today's study. Ventricles are of normal size. No significant extra-axial fluid collection is present. The paranasal sinuses andmastoid air cells are clear. The osseous skull is intact.  IMPRESSION: No acute intracranial abnormalities.   Electronically Signed   By: Kerby Moors M.D.   On: 04/06/2014 01:15  I  personally reviewed the imaging tests through PACS system I reviewed available ER/hospitalization records through the EMR    EKG Interpretation None      MDM   Final diagnoses:  Migraine headache    Typical migraine headache for the pt. Non focal neuro exam. No recent head trauma. No fever.Will treat with migraine cocktail and reevaluate.  Given her history of recent stroke CT of the head will be performed to make sure there's been no hemorrhagic conversion.  1:46 AM Patient is feeling much better at this time.  Discharge home in good condition.  Home with neurology followup.  She has Percocet at home.     Hoy Morn, MD 04/06/14 [phone]

## 2014-04-06 NOTE — ED Notes (Signed)
Patient transported to CT 

## 2014-04-07 ENCOUNTER — Other Ambulatory Visit: Payer: Self-pay | Admitting: *Deleted

## 2014-04-07 ENCOUNTER — Telehealth: Payer: Self-pay | Admitting: *Deleted

## 2014-04-07 ENCOUNTER — Encounter: Payer: Self-pay | Admitting: Endocrinology

## 2014-04-07 ENCOUNTER — Ambulatory Visit (INDEPENDENT_AMBULATORY_CARE_PROVIDER_SITE_OTHER): Payer: Medicare Other | Admitting: Endocrinology

## 2014-04-07 VITALS — BP 138/82 | HR 97 | Temp 98.6°F | Resp 16 | Ht 64.0 in | Wt 186.6 lb

## 2014-04-07 DIAGNOSIS — E1142 Type 2 diabetes mellitus with diabetic polyneuropathy: Secondary | ICD-10-CM

## 2014-04-07 DIAGNOSIS — I10 Essential (primary) hypertension: Secondary | ICD-10-CM

## 2014-04-07 DIAGNOSIS — I6350 Cerebral infarction due to unspecified occlusion or stenosis of unspecified cerebral artery: Secondary | ICD-10-CM

## 2014-04-07 DIAGNOSIS — E782 Mixed hyperlipidemia: Secondary | ICD-10-CM

## 2014-04-07 DIAGNOSIS — E1149 Type 2 diabetes mellitus with other diabetic neurological complication: Secondary | ICD-10-CM

## 2014-04-07 MED ORDER — INSULIN PEN NEEDLE 32G X 4 MM MISC
Status: DC
Start: 2014-04-07 — End: 2014-07-09

## 2014-04-07 MED ORDER — INSULIN GLARGINE 100 UNIT/ML SOLOSTAR PEN: PEN_INJECTOR | SUBCUTANEOUS | Status: DC

## 2014-04-07 MED ORDER — GLUCOSE BLOOD VI STRP: ORAL_STRIP | Status: DC

## 2014-04-07 MED ORDER — LIDOCAINE 5 % EX PTCH: 1.0000 | MEDICATED_PATCH | CUTANEOUS | Status: DC

## 2014-04-07 NOTE — Progress Notes (Signed)
Patient ID: [name], female   DOB: February 12, 1966, 48 y.o.   MRN: [id number]   Reason for Appointment: Diabetes follow-up   History of Present Illness:   Diagnosis: Type 2 diabetes mellitus, date of diagnosis: 2004.  PAST history: She has been treated mostly with insulin since about a year after diagnosis. She has had difficulty with consistent compliance with diet and also compliance with self care including glucose monitoring over the years. She had been mostly treated with basal insulin. Also had been tried on mealtime insulin but she would be noncompliant with this. Was tried on Prandin for mealtime control but difficult to judge efficacy because of lack of postprandial monitoring. Was given Victoza to start in 2011 but did not follow up after this.She was tried on premixed insulin but this did not help her control, mostly because of noncompliance with the doses. She has been using the V- go pump and had better control initially and was better compliant with the daily routine and boluses. She has had frequent education visits also.   RECENT history:   She was having relatively better readings on her last visit but now her blood sugars are out of control again This is likely to be from her drinking large amounts of regular soft drinks, sometimes 3 or more liters a day She claims she is allergic to artificial sweeteners and will not drink water Her boyfriend is making sure she is using the V-go pump every day and also she is trying to bolus for all meals and snacks as well as high readings However she did not bring her monitor for review or any records of readings A1c is still significantly high She does not appear to be benefiting as much from [place] at this time Her lab glucose in the last 3 days has been over 300 Mealtime insulin: 6 clicks whenever she is eating a meal which is at variable times; 3 for snacks  Glucometer: ? [employer] brand or the FreeStyle.  Blood Glucose readings:   Recently 250-375 Hypoglycemia frequency: Never.    Food preferences: eating  1 or 2 meals per day .  Physical activity: exercise: Minimal.  Certified Diabetes Educator visit: Most recent:, 2/14.  The diet that the patient has been following is no specific diet; still eating at irregular times, no balanced meals.   Wt Readings from Last 3 Encounters:  04/07/14 186 lb 9.6 oz (84.641 kg)  04/05/14 195 lb (88.451 kg)  03/26/14 191 lb (86.637 kg)   DM labs:  Lab Results  Component Value Date   HGBA1C 9.5* 04/04/2014   HGBA1C 9.1* 01/24/2014   HGBA1C 8.8* 11/06/2013   Lab Results  Component Value Date   MICROALBUR 1.0 04/04/2014   [place] 3 04/04/2014   CREATININE 0.80 0/37/0488    Complications: are: peripheral neuropathy.  PROBLEM 2: She is complaining about pains and paresthesia in her legs and hands. She does not want to use Lyrica and does not get enough relief from gabapentin She now wants to use Lidoderm patches which she had used in 2005    Labs:  Admission on 04/05/2014, Discharged on 04/06/2014  Component Date Value Ref Range Status  . Sodium 04/05/2014 135* 137 - 147 mEq/L Final  . Potassium 04/05/2014 4.0  3.7 - 5.3 mEq/L Final  . Chloride 04/05/2014 98  96 - 112 mEq/L Final  . CO2 04/05/2014 20  19 - 32 mEq/L Final  . Glucose, Bld 04/05/2014 323* 70 - 99 mg/dL Final  .  BUN 04/05/2014 10  6 - 23 mg/dL Final  . Creatinine, Ser 04/05/2014 0.66  0.50 - 1.10 mg/dL Final  . Calcium 04/05/2014 10.2  8.4 - 10.5 mg/dL Final  . GFR calc non Af Amer 04/05/2014 >90  >90 mL/min Final  . GFR calc Af Amer 04/05/2014 >90  >90 mL/min Final   Comment: (NOTE)                          The eGFR has been calculated using the CKD EPI equation.                          This calculation has not been validated in all clinical situations.                          eGFR's persistently <90 mL/min signify possible Chronic Kidney                          Disease.  . WBC 04/05/2014 16.4* 4.0  - 10.5 K/uL Final  . RBC 04/05/2014 4.73  3.87 - 5.11 MIL/uL Final  . Hemoglobin 04/05/2014 15.0  12.0 - 15.0 g/dL Final  . HCT 04/05/2014 41.9  36.0 - 46.0 % Final  . MCV 04/05/2014 88.6  78.0 - 100.0 fL Final  . MCH 04/05/2014 31.7  26.0 - 34.0 pg Final  . MCHC 04/05/2014 35.8  30.0 - 36.0 g/dL Final  . RDW 04/05/2014 12.9  11.5 - 15.5 % Final  . Platelets 04/05/2014 268  150 - 400 K/uL Final  . Sodium 04/05/2014 136* 137 - 147 mEq/L Final  . Potassium 04/05/2014 3.9  3.7 - 5.3 mEq/L Final  . Chloride 04/05/2014 100  96 - 112 mEq/L Final  . BUN 04/05/2014 8  6 - 23 mg/dL Final  . Creatinine, Ser 04/05/2014 0.80  0.50 - 1.10 mg/dL Final  . Glucose, Bld 04/05/2014 324* 70 - 99 mg/dL Final  . Calcium, Ion 04/05/2014 1.31* 1.12 - 1.23 mmol/L Final  . TCO2 04/05/2014 21  0 - 100 mmol/L Final  . Hemoglobin 04/05/2014 15.3* 12.0 - 15.0 g/dL Final  . HCT 04/05/2014 45.0  36.0 - 46.0 % Final  Appointment on 04/04/2014  Component Date Value Ref Range Status  . Hemoglobin A1C 04/04/2014 9.5* 4.6 - 6.5 % Final   Glycemic Control Guidelines for People with Diabetes:Non Diabetic:  <6%Goal of Therapy: <7%Additional Action Suggested:  >8%   . Sodium 04/04/2014 133* 135 - 145 mEq/L Final  . Potassium 04/04/2014 4.0  3.5 - 5.1 mEq/L Final  . Chloride 04/04/2014 101  96 - 112 mEq/L Final  . CO2 04/04/2014 20  19 - 32 mEq/L Final  . Glucose, Bld 04/04/2014 388* 70 - 99 mg/dL Final  . BUN 04/04/2014 12  6 - 23 mg/dL Final  . Creatinine, Ser 04/04/2014 0.9  0.4 - 1.2 mg/dL Final  . Total Bilirubin 04/04/2014 0.4  0.3 - 1.2 mg/dL Final  . Alkaline Phosphatase 04/04/2014 71  39 - 117 U/L Final  . AST 04/04/2014 15  0 - 37 U/L Final  . ALT 04/04/2014 13  0 - 35 U/L Final  . Total Protein 04/04/2014 6.6  6.0 - 8.3 g/dL Final  . Albumin 04/04/2014 3.6  3.5 - 5.2 g/dL Final  . Calcium 04/04/2014 9.1  8.4 - 10.5 mg/dL Final  . GFR  04/04/2014 72.63  >60.00 mL/min Final  . Cholesterol 04/04/2014 99  0 - 200  mg/dL Final   ATP III Classification       Desirable:  < 200 mg/dL               Borderline High:  200 - 239 mg/dL          High:  > = 240 mg/dL  . Triglycerides 04/04/2014 314.0* 0.0 - 149.0 mg/dL Final   Normal:  <150 mg/dLBorderline High:  150 - 199 mg/dL  . HDL 04/04/2014 33.40* >39.00 mg/dL Final  . VLDL 04/04/2014 62.8* 0.0 - 40.0 mg/dL Final  . LDL Cholesterol 04/04/2014 3  0 - 99 mg/dL Final  . Total CHOL/HDL Ratio 04/04/2014 3   Final                  Men          Women1/2 Average Risk     3.4          3.3Average Risk          5.0          4.42X Average Risk          9.6          7.13X Average Risk          15.0          11.0                      . Microalb, Ur 04/04/2014 1.0  0.0 - 1.9 mg/dL Final  . Creatinine,U 04/04/2014 52.7   Final  . Microalb Creat Ratio 04/04/2014 1.9  0.0 - 30.0 mg/g Final  . TSH 04/04/2014 0.21* 0.35 - 5.50 uIU/mL Final  . Free T4 04/04/2014 0.92  0.60 - 1.60 ng/dL Final  . WBC 04/04/2014 13.0* 4.5 - 10.5 K/uL Final  . RBC 04/04/2014 4.25  3.87 - 5.11 Mil/uL Final  . Platelets 04/04/2014 233.0  150.0 - 400.0 K/uL Final  . Hemoglobin 04/04/2014 13.2  12.0 - 15.0 g/dL Final  . HCT 04/04/2014 38.9  36.0 - 46.0 % Final  . MCV 04/04/2014 91.5  78.0 - 100.0 fl Final  . MCHC 04/04/2014 33.8  30.0 - 36.0 g/dL Final  . RDW 04/04/2014 13.5  11.5 - 14.6 % Final      Medication List       This list is accurate as of: 04/07/14  3:27 PM.  Always use your most recent med list.               amitriptyline 150 MG tablet  Commonly known as:  ELAVIL     aspirin 325 MG tablet  Take 1 tablet (325 mg total) by mouth daily.     atorvastatin 40 MG tablet  Commonly known as:  LIPITOR  Take 1 tablet (40 mg total) by mouth daily.     Canagliflozin 100 MG Tabs  Commonly known as:  INVOKANA  Take 1 tablet (100 mg total) by mouth daily.     chlorproMAZINE 100 MG tablet  Commonly known as:  THORAZINE  Take 100-200 mg by mouth at bedtime.     diazepam 10 MG  tablet  Commonly known as:  VALIUM  Take 10 mg by mouth 3 (three) times daily as needed for anxiety.     eletriptan 40 MG tablet  Commonly known as:  RELPAX  Take 1 tablet (40 mg total) by mouth as  needed for migraine or headache. One tablet by mouth at onset of headache. May repeat in 2 hours if headache persists or recurs.     esomeprazole 40 MG capsule  Commonly known as:  NEXIUM  Take 40 mg by mouth 2 (two) times daily before a meal.     glucose blood test strip  Commonly known as:  FREESTYLE LITE  Use as instructed to check blood sugar 2 times daily dx code 250.02     Krill Oil 300 MG Caps  Take 3 capsules by mouth daily.     levothyroxine 25 MCG tablet  Commonly known as:  SYNTHROID  Take 1 tablet (25 mcg total) by mouth daily before breakfast.     lidocaine 2 % jelly  Commonly known as:  XYLOCAINE     lidocaine 5 %  Commonly known as:  LIDODERM  Place 1 patch onto the skin daily as needed (for feet pain). Remove & Discard patch within 12 hours or as directed by MD     lidocaine 5 % ointment  Commonly known as:  XYLOCAINE  Apply 1 application topically as needed for moderate pain.     liothyronine 25 MCG tablet  Commonly known as:  CYTOMEL     magnesium oxide 400 MG tablet  Commonly known as:  MAG-OX  Take 400 mg by mouth daily.     NOVOLOG 100 UNIT/ML injection  Generic drug:  insulin aspart  TO USE WITH V-GO PUMP     oxyCODONE-acetaminophen 10-325 MG per tablet  Commonly known as:  PERCOCET  Take 1 tablet by mouth 3 (three) times daily as needed for pain.     oxyCODONE-acetaminophen 5-325 MG/5ML solution  Commonly known as:  ROXICET  Take by mouth every 4 (four) hours as needed for severe pain.     promethazine 25 MG tablet  Commonly known as:  PHENERGAN  Take 25 mg by mouth every 6 (six) hours as needed for nausea or vomiting.     ramipril 5 MG capsule  Commonly known as:  ALTACE  Take 5 mg by mouth daily.     spironolactone 25 MG tablet   Commonly known as:  ALDACTONE  Take 1 tablet (25 mg total) by mouth daily.     topiramate 50 MG tablet  Commonly known as:  TOPAMAX  Take 1 tablet (50 mg total) by mouth 2 (two) times daily.     V-GO 40 Kit  by Does not apply route.     Vitamin D (Ergocalciferol) 50000 UNITS Caps capsule  Commonly known as:  DRISDOL  Take 50,000 Units by mouth every 7 (seven) days. Take on Sunday        Allergies:  Allergies  Allergen Reactions  . Latex Itching, Rash and Other (See Comments)    Pt states she cannot use condoms - cause an infection.  Use of latex on skin is okay.  Tape causes rash  . Ibuprofen Other (See Comments)    headaches  . Sweetening Enhancer [Flavoring Agent] Nausea And Vomiting    And headaches  . Tetracyclines & Related Nausea And Vomiting    Yeast infections  . Trazodone And Nefazodone Other (See Comments)    Hallucinations   . Triazolam Other (See Comments)    hallucinations  . Aspartame And Phenylalanine Nausea And Vomiting    headaches    Past Medical History  Diagnosis Date  . Hypertension   . Hyperlipemia   . GERD (gastroesophageal reflux disease)   .  Insomnia   . SUI (stress urinary incontinence, female) S/P SLING 12-29-2011  . SOB (shortness of breath) on exertion   . Bipolar disorder   . Neurogenic bladder   . Hypercholesterolemia   . Gastroparesis   . Ulcer   . MRSA (methicillin resistant Staphylococcus aureus)   . Chronic pain   . Edema   . Chest pain     a. 2008 Cath: normal cors;  b. 12/2013 Lexi CL: EF 47%, no ischemia/infarct.  . Anxiety   . Vertigo   . COPD (chronic obstructive pulmonary disease)   . Cholecystitis   . Hypersomnia   . Tobacco abuse   . Claudication     a. 12/2013 ABI's: R 0.97, L 0.94.  [name] Palpitations   . Anginal pain   . Pneumonia     "twice, I think" (02/12/2014)  . Sleep apnea     didn't tolerate cpap (02/12/2014)  . Type II diabetes mellitus   . History of blood transfusion 1986    "related to lost a  child" (02/12/2014)  . H/O hiatal hernia   . TWSFKCLE(751.7)     "weekly" (02/12/2014)  . Stroke 02/12/2014    "eyesight is messed up; not steady on my feet" (02/12/2014)  . Arthritis     "qwhere" (02/12/2014)  . DJD (degenerative joint disease)   . Chronic back pain   . Depression   . Personality disorder     Past Surgical History  Procedure Laterality Date  . Knee arthroscopy w/ allograft impant Left     graft x 2  . Foot surgery Bilateral   . Anterior cervical decomp/discectomy fusion  2000    C6 - 7  . Pubovaginal sling  12/29/2011    Procedure: Gaynelle Arabian;  Surgeon: Reece Packer, MD;  Location: [facility];  Service: Urology;  Laterality: N/A;  cysto and sparc sling   . Lumbar fusion      "cage in my spine"  . Total abdominal hysterectomy w/ bilateral salpingoophorectomy  1997  . Multiple laparoscopies for endometriosis    . Cesarean section  1989; 1992  . Repeat reconstruction acl left knee/ screws removed  03-28-2000    CADAVER GRAFT  . 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 Knee surgery      TOTAL 8 SURG'S  . Cystoscopy with injection  05/04/2012    Procedure: CYSTOSCOPY WITH INJECTION;  Surgeon: Reece Packer, MD;  Location: [facility];  Service: Urology;  Laterality: N/A;  MACROPLASTIQUE INJECTION  . Cystoscopy with injection  08/28/2012    Procedure: CYSTOSCOPY WITH INJECTION;  Surgeon: Reece Packer, MD;  Location: [facility];  Service: Urology;  Laterality: N/A;  cysto and macroplastique   . Carpal tunnel release Right 04-25-2013  . Cysto N/A 04/30/2013    Procedure: CYSTOSCOPY;  Surgeon: Reece Packer, MD;  Location: [facility];  Service: Urology;  Laterality: N/A;  . Pubovaginal sling N/A 04/30/2013    Procedure: REMOVAL OF VAGINAL MESH;  Surgeon: Reece Packer, MD;  Location: [facility];  Service: Urology;  Laterality: N/A;  . Abdominal hysterectomy    . Hernia repair  ?1996    "stomach"   . Cardiac catheterization  05-22-2008   DR SKAINS    NO SIGNIFECANT CAD/ NORMAL LV/  EF 65%/  NO WALL MOTION ABNORMALITIES  . Cardiac catheterization  02/12/2014    Family History  Problem Relation Age of Onset  . Hypertension Mother   .  Diabetes Mother   . Cancer - Other Mother   . Cancer - Other Father   . Heart attack Father     Social History:  reports that she has been smoking Cigarettes and E-cigarettes.  She has a 41 pack-year smoking history. She has never used smokeless tobacco. She reports that she drinks alcohol. She reports that she does not use illicit drugs.  Review of Systems -   She has history of high triglycerides, last level was 280 and has not been taking her fenofibrate. However he is taking Lipitor and fish oil. Triglycerides are still high probably from her high glucose levels  Lab Results  Component Value Date   CHOL 99 04/04/2014   HDL 33.40* 04/04/2014   LDLCALC 3 04/04/2014   LDLDIRECT 81.9 12/13/2013   TRIG 314.0* 04/04/2014   CHOLHDL 3 04/04/2014    Hypothermia: She has not had this again. Not clear of the etiology. Her thyroid levels were normal Now with taking levothyroxine 25 mcg her TSH is low at 0.21  Hypertension:  patient is on ramipril 5 mg and spironolactone  HYPOKALEMIA: Her potassium is  improved with starting ramipril. She is not having any cramps.   No recent edema    Examination:   BP 138/82  Pulse 97  Temp(Src) 98.6 F (37 C)  Resp 16  Ht _0  (1.626 m)  Wt 186 lb 9.6 oz (84.641 kg)  BMI 32.01 kg/m2  SpO2 97%  Body mass index is 32.01 kg/(m^2).   No ankle edema  No rash on her abdomen  Assesment/Plan:   1. Diabetes type 2, uncontrolled   The patient's diabetes control is much worse now because of noncompliance with her diet and drinking very large amounts of drinks with sugar On her last visit she had much better control with increasing her basal rate and she was better compliant with her mealtime boluses also She  is still very compliant with using her V.-go pump with the help of her boyfriend but she is not motivated to improve her self-care She thinks she is checking her blood sugars regularly but did not bring her monitor for review Glucose readings have been over 300 recently and A1c is high at 9.5 Recommendations made today:  To check blood sugars more consistently with a FreeStyle monitor before and after meals, checked with the pharmacy and she apparently did not sign a release to get insurance benefits  Reduce soft drinks and drink only water to which she can add lemon with no sugar  Take extra boluses for high readings consistently  Balanced meals with more protein  2. Hypothermia: Resolved. Do not think she has any thyroid issues and she can stop her levothyroxine especially since TSH is now low  3. Neuropathy: She can try the Lidoderm patch on her legs. Emphasized to her that her neuropathic symptoms are worse because of hyperglycemia continues to improve her blood sugar control  4. Hyperlipidemia: Triglycerides will be better when glucose is better controlled. Her cholesterol is only 99 and she can probably get by with only 10 mg of Lipitor instead of [place] 04/07/2014, 3:27 PM    CC: PCP

## 2014-04-07 NOTE — Patient Instructions (Addendum)
STOP ALL REGULAR DRINKS  STOP levothyroxine  Lantus insulin 15 units in am daily until am sugar <130  Please check blood sugars at least half the time about 2 hours after any meal and as directed on waking up. Please bring blood sugar monitor to each visit

## 2014-04-07 NOTE — Telephone Encounter (Signed)
Likely needs follow up with Dr. Leonie Man or [facility]. Or they could adjust meds by phone. -VRP

## 2014-04-08 ENCOUNTER — Other Ambulatory Visit: Payer: Self-pay | Admitting: Neurology

## 2014-04-08 DIAGNOSIS — R51 Headache: Secondary | ICD-10-CM

## 2014-04-08 MED ORDER — TRAMADOL HCL 50 MG PO TABS: 100.0000 mg | ORAL_TABLET | Freq: Four times a day (QID) | ORAL | Status: DC | PRN

## 2014-04-08 NOTE — Telephone Encounter (Signed)
I spoke to the patient I advised her to increase her Topamax to 100 mg twice daily for headache prophylaxis and to try tramadol 100 mg every 6 hourly as needed for headache relief. She mentioned the onset of tremors in her hands and I  advised her to see a primary care physician for that.

## 2014-04-10 ENCOUNTER — Telehealth: Payer: Self-pay | Admitting: Neurology

## 2014-04-10 ENCOUNTER — Telehealth: Payer: Self-pay

## 2014-04-10 DIAGNOSIS — G43019 Migraine without aura, intractable, without status migrainosus: Secondary | ICD-10-CM

## 2014-04-10 NOTE — Telephone Encounter (Signed)
Pt's husband called stating the pt's Lidoderm 5% patches are not covered. Pt is requesting a PA be initiated for medication.   Thanks!

## 2014-04-10 NOTE — Telephone Encounter (Signed)
I spoke with the patient.  She indicates Dr Dwyane Dee is prescribing Lidocaine.   She would like a message sent to Dr Leonie Man regarding other medication, Topamax and Relpax.  She says Topamax is like "baby aspirin" to her, and although she is taking it as prescribed, she still has headaches.  Also says she is unable to take Relpax.  States when she takes this med she becomes very aggressive and angry.  She would like to know if this med could be changed to something else.  Please advise.  Thank you.

## 2014-04-10 NOTE — Telephone Encounter (Signed)
Needs refill on lidocaine (XYLOCAINE) 5 % ointment

## 2014-04-11 NOTE — Telephone Encounter (Signed)
I tried calling patient at listed number and got a man`s answering machine and left a message to call back incase it was the correct number. She can increase Topamax to 100 mg twice daily and change relpax to maxalt 10 mg prn for symptomatic relief instead

## 2014-04-14 MED ORDER — RIZATRIPTAN BENZOATE 10 MG PO TABS: 10.0000 mg | ORAL_TABLET | ORAL | Status: DC | PRN

## 2014-04-14 MED ORDER — TOPIRAMATE 100 MG PO TABS: 100.0000 mg | ORAL_TABLET | Freq: Two times a day (BID) | ORAL | Status: DC

## 2014-04-14 NOTE — Telephone Encounter (Signed)
Rx's have been updated and sent.  I spoke t the patient.  She is aware.

## 2014-04-25 ENCOUNTER — Telehealth: Payer: Self-pay | Admitting: Neurology

## 2014-04-25 ENCOUNTER — Other Ambulatory Visit: Payer: Self-pay | Admitting: Endocrinology

## 2014-04-25 DIAGNOSIS — G44209 Tension-type headache, unspecified, not intractable: Secondary | ICD-10-CM

## 2014-04-25 MED ORDER — TIZANIDINE HCL 2 MG PO CAPS: ORAL_CAPSULE | ORAL | Status: DC

## 2014-04-25 NOTE — Telephone Encounter (Signed)
Shanon Brow is returning your call regarding patient.

## 2014-04-25 NOTE — Telephone Encounter (Addendum)
I called pt and spoke to her fiance.  Pt continues with headache (migraine level 10).  She has taken topamax for the month, noted swelling in legs and feet. Stopped this 2 days ago, with no change.   She is taken the maxalt for the last 2days and has eased for about 30 min.  Nausea and photosensitivity associated sx.  ? Get another MRI.  Will touch base with Dr. Leonie Man.   Consulted Dr. Leonie Man.  Try zanaflex 2mg  po qhs for one week the 4mg  po qhs.  Will try to get in sooner.

## 2014-04-25 NOTE — Telephone Encounter (Signed)
I called and LMVM for Shanon Brow to call re: message he left for pt.

## 2014-04-25 NOTE — Telephone Encounter (Signed)
Pt's fiance called states pt's legs and feet and swelled and severe headaches are getting worse, Pt's pharmacy states she needs to be seen. Please call Shanon Brow to see about getting pt in sooner. Thanks

## 2014-04-25 NOTE — Telephone Encounter (Signed)
Patient's fiance [name] calling back to state that it is urgent and patient is still having severe headaches and is getting worse. Please return call as soon as possible.

## 2014-04-28 NOTE — Telephone Encounter (Signed)
I called and LMVM for pt/ fiance that per Dr. Leonie Man ok to see NP.  Appt made with LLam/NP 05-01-14 at 1400, be here [phone], WID to assist if needed. Told to bring updated med list.

## 2014-04-29 ENCOUNTER — Telehealth: Payer: Self-pay | Admitting: Cardiology

## 2014-04-29 NOTE — Telephone Encounter (Signed)
New Prob    Requesting clearance for pt to restart Adderall 20 mg prescription. Please call. Fax response OK [phone]

## 2014-05-01 ENCOUNTER — Ambulatory Visit: Payer: Self-pay | Admitting: Nurse Practitioner

## 2014-05-01 NOTE — Telephone Encounter (Signed)
[name], Psychiatrist , [facility] [place]

## 2014-05-02 ENCOUNTER — Telehealth: Payer: Self-pay | Admitting: Neurology

## 2014-05-02 NOTE — Telephone Encounter (Signed)
Explained to the patient's husband that the patient would have to be scheduled the first available and placed on the waiting list, he verbalized understanding

## 2014-05-02 NOTE — Telephone Encounter (Signed)
[name] may proceed with Adderall. Prior cardiac catheterization demonstrated no evidence of coronary artery disease. Please fax.

## 2014-05-02 NOTE — Telephone Encounter (Signed)
Patient needs soon appt--has headache--patient had emergency yesterday and did not make appt--please call

## 2014-05-08 ENCOUNTER — Encounter: Payer: Self-pay | Admitting: Endocrinology

## 2014-05-08 ENCOUNTER — Other Ambulatory Visit (INDEPENDENT_AMBULATORY_CARE_PROVIDER_SITE_OTHER): Payer: Medicare Other

## 2014-05-08 ENCOUNTER — Encounter: Payer: Self-pay | Admitting: Cardiology

## 2014-05-08 ENCOUNTER — Ambulatory Visit (INDEPENDENT_AMBULATORY_CARE_PROVIDER_SITE_OTHER): Payer: Medicare Other | Admitting: Endocrinology

## 2014-05-08 ENCOUNTER — Ambulatory Visit (INDEPENDENT_AMBULATORY_CARE_PROVIDER_SITE_OTHER): Payer: Medicare Other | Admitting: Cardiology

## 2014-05-08 VITALS — BP 150/76 | HR 90 | Ht 66.0 in | Wt 201.0 lb

## 2014-05-08 VITALS — BP 124/78 | HR 102 | Temp 98.3°F | Resp 16 | Ht 64.0 in | Wt 201.2 lb

## 2014-05-08 DIAGNOSIS — E1149 Type 2 diabetes mellitus with other diabetic neurological complication: Secondary | ICD-10-CM

## 2014-05-08 DIAGNOSIS — Z720 Tobacco use: Secondary | ICD-10-CM

## 2014-05-08 DIAGNOSIS — F17200 Nicotine dependence, unspecified, uncomplicated: Secondary | ICD-10-CM

## 2014-05-08 DIAGNOSIS — R635 Abnormal weight gain: Secondary | ICD-10-CM

## 2014-05-08 DIAGNOSIS — I10 Essential (primary) hypertension: Secondary | ICD-10-CM

## 2014-05-08 DIAGNOSIS — E1142 Type 2 diabetes mellitus with diabetic polyneuropathy: Secondary | ICD-10-CM

## 2014-05-08 DIAGNOSIS — I6350 Cerebral infarction due to unspecified occlusion or stenosis of unspecified cerebral artery: Secondary | ICD-10-CM

## 2014-05-08 DIAGNOSIS — E785 Hyperlipidemia, unspecified: Secondary | ICD-10-CM

## 2014-05-08 DIAGNOSIS — R609 Edema, unspecified: Secondary | ICD-10-CM

## 2014-05-08 LAB — BASIC METABOLIC PANEL
BUN: 7 mg/dL (ref 6–23)
CALCIUM: 8.6 mg/dL (ref 8.4–10.5)
CO2: 24 meq/L (ref 19–32)
CREATININE: 0.8 mg/dL (ref 0.4–1.2)
Chloride: 97 mEq/L (ref 96–112)
GFR: 82.23 mL/min (ref >60.00)
Glucose, Bld: 427 mg/dL — ABNORMAL HIGH (ref 70–99)
Potassium: 3.3 mEq/L — ABNORMAL LOW (ref 3.5–5.1)
SODIUM: 132 meq/L — AB (ref 135–145)

## 2014-05-08 MED ORDER — NICOTINE 21 MG/24HR TD PT24: 21.0000 mg | MEDICATED_PATCH | Freq: Every day | TRANSDERMAL | Status: DC

## 2014-05-08 MED ORDER — FUROSEMIDE 20 MG PO TABS: 20.0000 mg | ORAL_TABLET | ORAL | Status: DC | PRN

## 2014-05-08 NOTE — Progress Notes (Signed)
[place]. [address], [address] [place],   [address] Phone: [phone] Fax:  [phone]  Date:  05/08/2014   ID:  [name], DOB September 21, 1966, MRN [id number]  PCP:  Tamsen Roers, MD   History of Present Illness: [name] is a 48 y.o. female who underwent cardiac catheterization on 02/12/14 after abnormal stress test which revealed normal coronary arteries thankfully however approximately 3 hours after procedure and she was getting up to go to the bathroom, she noted diplopia and had a third nerve palsy, intranuclear ophthalmoplegia. Code stroke was called, neurology assessed her and she was monitored in the hospital for the next 24-48 hours. During that same period of time, her significant other was hospitalized as well for heart failure exacerbation.  On 02/25/14 she called Dr. Clydene Fake office with some concerns of transient 5 minute episode of sharp shooting pain in the back of the head and uncontrollable shaking and felt a heart arrhythmia. Pain was on the right side on the top of her head, earlobe at the back of the head.  Ejection fraction appeared normal on catheterization.  Eye patch was recommended rotating between eye every 2 hours. Full dose of that was recommended by neurology.  05/08/14-doing very well. Trying to stop smoking but this is challenging for her. She is having some lower extremity edema. On spironolactone. No chest pain. She does have shortness of breath with exertion at times. Coronary arteries were reassuring.  Wt Readings from Last 3 Encounters:  05/08/14 201 lb (91.173 kg)  05/08/14 201 lb 3.2 oz (91.264 kg)  04/07/14 186 lb 9.6 oz (84.641 kg)     Past Medical History  Diagnosis Date  . Hypertension   . Hyperlipemia   . GERD (gastroesophageal reflux disease)   . Insomnia   . SUI (stress urinary incontinence, female) S/P SLING 12-29-2011  . SOB (shortness of breath) on exertion   . Bipolar disorder   . Neurogenic bladder   .  Hypercholesterolemia   . Gastroparesis   . Ulcer   . MRSA (methicillin resistant Staphylococcus aureus)   . Chronic pain   . Edema   . Chest pain     a. 2008 Cath: normal cors;  b. 12/2013 Lexi CL: EF 47%, no ischemia/infarct.  . Anxiety   . Vertigo   . COPD (chronic obstructive pulmonary disease)   . Cholecystitis   . Hypersomnia   . Tobacco abuse   . Claudication     a. 12/2013 ABI's: R 0.97, L 0.94.  [name] Palpitations   . Anginal pain   . Pneumonia     "twice, I think" (02/12/2014)  . Sleep apnea     didn't tolerate cpap (02/12/2014)  . Type II diabetes mellitus   . History of blood transfusion 1986    "related to lost a child" (02/12/2014)  . H/O hiatal hernia   . ONGEXBMW(413.2)     "weekly" (02/12/2014)  . Stroke 02/12/2014    "eyesight is messed up; not steady on my feet" (02/12/2014)  . Arthritis     "qwhere" (02/12/2014)  . DJD (degenerative joint disease)   . Chronic back pain   . Depression   . Personality disorder     Past Surgical History  Procedure Laterality Date  . Knee arthroscopy w/ allograft impant Left     graft x 2  . Foot surgery Bilateral   . Anterior cervical decomp/discectomy fusion  2000    C6 - 7  .  Pubovaginal sling  12/29/2011    Procedure: Gaynelle Arabian;  Surgeon: Reece Packer, MD;  Location: [facility];  Service: Urology;  Laterality: N/A;  cysto and sparc sling   . Lumbar fusion      "cage in my spine"  . Total abdominal hysterectomy w/ bilateral salpingoophorectomy  1997  . Multiple laparoscopies for endometriosis    . Cesarean section  1989; 1992  . Repeat reconstruction acl left knee/ screws removed  03-28-2000    CADAVER GRAFT  . 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 Knee surgery      TOTAL 8 SURG'S  . Cystoscopy with injection  05/04/2012    Procedure: CYSTOSCOPY WITH INJECTION;  Surgeon: Reece Packer, MD;  Location: [facility];  Service: Urology;  Laterality: N/A;   MACROPLASTIQUE INJECTION  . Cystoscopy with injection  08/28/2012    Procedure: CYSTOSCOPY WITH INJECTION;  Surgeon: Reece Packer, MD;  Location: [facility];  Service: Urology;  Laterality: N/A;  cysto and macroplastique   . Carpal tunnel release Right 04-25-2013  . Cysto N/A 04/30/2013    Procedure: CYSTOSCOPY;  Surgeon: Reece Packer, MD;  Location: [facility];  Service: Urology;  Laterality: N/A;  . Pubovaginal sling N/A 04/30/2013    Procedure: REMOVAL OF VAGINAL MESH;  Surgeon: Reece Packer, MD;  Location: [facility];  Service: Urology;  Laterality: N/A;  . Abdominal hysterectomy    . Hernia repair  ?1996    "stomach"  . Cardiac catheterization  05-22-2008   DR Litzi Binning    NO SIGNIFECANT CAD/ NORMAL LV/  EF 65%/  NO WALL MOTION ABNORMALITIES  . Cardiac catheterization  02/12/2014    Current Outpatient Prescriptions  Medication Sig Dispense Refill  . aspirin 325 MG tablet Take 1 tablet (325 mg total) by mouth daily.      [name] atorvastatin (LIPITOR) 40 MG tablet Take 1 tablet (40 mg total) by mouth daily.  30 tablet  3  . Canagliflozin (INVOKANA) 100 MG TABS Take 1 tablet (100 mg total) by mouth daily.  30 tablet  5  . chlorproMAZINE (THORAZINE) 100 MG tablet Take 100-200 mg by mouth at bedtime.      . diazepam (VALIUM) 10 MG tablet Take 10 mg by mouth 3 (three) times daily as needed for anxiety.      [name] esomeprazole (NEXIUM) 40 MG capsule Take 40 mg by mouth 2 (two) times daily before a meal.      . glucose blood (FREESTYLE LITE) test strip Use as instructed to check blood sugar 2 times daily dx code 250.02  100 each  3  . insulin aspart (NOVOLOG) 100 UNIT/ML injection TO USE WITH V-GO PUMP      . Insulin Disposable Pump (V-GO 40) KIT by Does not apply route.      . Insulin Glargine (LANTUS SOLOSTAR) 100 UNIT/ML Solostar Pen Inject 15 units daily  5 pen  1  . Insulin Pen Needle 32G X 4 MM MISC Use as directed  30 each  1  . Krill Oil 300 MG CAPS Take 3 capsules by mouth  daily.      . Lancets (FREESTYLE) lancets Test blood sugar 3 times daily as instructed. Dx code: 250.62  100 each  2  . levothyroxine (SYNTHROID, LEVOTHROID) 25 MCG tablet       . lidocaine (LIDODERM) 5 % Place 1 patch onto the skin daily. Remove & Discard patch within 12 hours or  as directed by MD  30 patch  0  . lidocaine (XYLOCAINE) 2 % jelly       . lidocaine (XYLOCAINE) 5 % ointment Apply 1 application topically as needed for moderate pain.      . magnesium oxide (MAG-OX) 400 MG tablet Take 400 mg by mouth daily.      [name] oxyCODONE (OXY IR/ROXICODONE) 5 MG immediate release tablet       . oxyCODONE-acetaminophen (PERCOCET) 10-325 MG per tablet Take 1 tablet by mouth 3 (three) times daily as needed for pain.      [name] oxyCODONE-acetaminophen (ROXICET) 5-325 MG/5ML solution Take by mouth every 4 (four) hours as needed for severe pain.      . promethazine (PHENERGAN) 25 MG tablet Take 25 mg by mouth every 6 (six) hours as needed for nausea or vomiting.      . ramipril (ALTACE) 5 MG capsule Take 5 mg by mouth daily.      . rizatriptan (MAXALT) 10 MG tablet Take 1 tablet (10 mg total) by mouth as needed for migraine. May repeat in 2 hours if needed  10 tablet  3  . spironolactone (ALDACTONE) 25 MG tablet Take 1 tablet (25 mg total) by mouth daily.  30 tablet  2  . tizanidine (ZANAFLEX) 2 MG capsule 2 mg po qhs for one week then increase to 1m po qhs  60 capsule  1  . topiramate (TOPAMAX) 100 MG tablet Take 1 tablet (100 mg total) by mouth 2 (two) times daily.  60 tablet  3  . traMADol (ULTRAM) 50 MG tablet Take 2 tablets (100 mg total) by mouth every 6 (six) hours as needed.  30 tablet  1  . Vitamin D, Ergocalciferol, (DRISDOL) 50000 UNITS CAPS capsule Take 50,000 Units by mouth every 7 (seven) days. Take on Sunday       No current facility-administered medications for this visit.    Allergies:    Allergies  Allergen Reactions  . Latex Itching, Rash and Other (See Comments)    Pt states she  cannot use condoms - cause an infection.  Use of latex on skin is okay.  Tape causes rash  . Ibuprofen Other (See Comments)    headaches  . Sweetening Enhancer [Flavoring Agent] Nausea And Vomiting    And headaches  . Tetracyclines & Related Nausea And Vomiting    Yeast infections  . Trazodone And Nefazodone Other (See Comments)    Hallucinations   . Triazolam Other (See Comments)    hallucinations  . Aspartame And Phenylalanine Nausea And Vomiting    headaches    Social History:  The patient  reports that she has been smoking Cigarettes and E-cigarettes.  She has a 41 pack-year smoking history. She has never used smokeless tobacco. She reports that she drinks alcohol. She reports that she does not use illicit drugs.   ROS:  Please see the history of present illness.   Improved diplopia however still present. No chest pain, no significant short of breath. No further strokelike symptoms.  PHYSICAL EXAM: VS:  BP 150/76  Pulse 90  Ht 5' 6" (1.676 m)  Wt 201 lb (91.173 kg)  BMI 32.46 kg/m2 Well nourished, well developed, in no acute distress HEENT: normal Neck: no JVD Cardiac:  normal S1, S2; RRR; no murmur Lungs:  clear to auscultation bilaterally, no wheezing, rhonchi or rales Abd: soft, nontender, no hepatomegaly Ext: mild LE, bilat edema Skin: warm and dry Neuro: Her eye deviation has improved.  Less marked nerve 3 palsy.  EKG: None today Cardiac catheterization 2/15-no angiographic significant CAD-EF 55%    ASSESSMENT AND PLAN:  1. Tobacco use-encourage cessation. She is using e-cigarette. I will give her nicotine patch 21.  2. Diabetes-hemoglobin A1c in the 9 range. Improved from 13. She is working with Dr. Dwyane Dee on this. 3. Peripheral neuropathy-I've asked her to call her primary physician, Dr. Tamsen Roers, for a Xylocaine renewal. 4. Peripheral edema-I have given her a prescription for support hose. 5. I will see her back in 6 months. Reassuring angiogram. I would  like for her to be on atorvastatin 40 mg once a day. LDL 62. Great. 6. She may proceed with Adderall if necessary given her normal coronary arteries.  Signed, [name] Furbish, MD [facility]  05/08/2014 3:22 PM

## 2014-05-08 NOTE — Patient Instructions (Signed)
Your physician has recommended you make the following change in your medication:   1. Start Nicotine Patches as directed 2. You have been prescribed Compression Stocking 3. Start Lasix 20 mg as needed  Your physician wants you to follow-up in: 6 months with Dr. Marlou Porch. You will receive a reminder letter in the mail two months in advance. If you don't receive a letter, please call our office to schedule the follow-up appointment.

## 2014-05-08 NOTE — Progress Notes (Signed)
Patient ID: [name], female   DOB: [birth year], 48 y.o.   MRN: [id number]   Reason for Appointment: Diabetes follow-up   History of Present Illness:   Diagnosis: Type 2 diabetes mellitus, date of diagnosis: 2004.  PAST history: She has been treated mostly with insulin since about a year after diagnosis. She has had difficulty with consistent compliance with diet and also compliance with self care including glucose monitoring over the years. She had been mostly treated with basal insulin. Also had been tried on mealtime insulin but she would be noncompliant with this. Was tried on Prandin for mealtime control but difficult to judge efficacy because of lack of postprandial monitoring. Was given Victoza to start in 2011 but did not follow up after this.She was tried on premixed insulin but this did not help her control, mostly because of noncompliance with the doses. She has been using the V- go pump and had better control initially and was better compliant with the daily routine and boluses. She has had frequent education visits also.   RECENT history:   Her blood sugars are out of control again This is likely to be from her drinking juices: 6-7 glasses daily along with some Gatorade, previously was drinking soft drinks She claims she is allergic to artificial sweeteners and will not drink water consistently Her boyfriend is making sure she is using the V-go pump daily and also taking a bolus for all mealsas well as high readings She was started on 15 units of Lantus in the last visit but blood sugars do not appear to be better from recall However she did not bring her monitor for review again or any records of readings A1c was still over 9% in April She does not appear to be benefiting as much from [place] but is taking 100 mg Recently having vaginal candidiasis after taking Cipro  Mealtime insulin: 6 clicks whenever she is eating a meal which is at variable times; 3 clicks for snacks and  3-4 with high readings  Glucometer:  FreeStyle.  Blood Glucose readings:  Rarely <200, usually up to 300; once 83, fasting  Hypoglycemia frequency: Never.    Food preferences: eating 1 or 2 meals per day .  Physical activity: exercise: Minimal.  Certified Diabetes Educator visit: Most recent:, 2/14.  The diet that the patient has been following is no specific diet; still eating at irregular times; she thinks she is eating a heart healthy meals  Wt Readings from Last 3 Encounters:  05/08/14 201 lb 3.2 oz (91.264 kg)  04/07/14 186 lb 9.6 oz (84.641 kg)  04/05/14 195 lb (88.451 kg)   DM labs:  Lab Results  Component Value Date   HGBA1C 9.5* 04/04/2014   HGBA1C 9.1* 01/24/2014   HGBA1C 8.8* 11/06/2013   Lab Results  Component Value Date   MICROALBUR 1.0 04/04/2014   [place] 3 04/04/2014   CREATININE 0.80 9/62/8366    Complications: are: peripheral neuropathy.     Labs:  No visits with results within 1 Week(s) from this visit. Latest known visit with results is:  Admission on 04/05/2014, Discharged on 04/06/2014  Component Date Value Ref Range Status  . Sodium 04/05/2014 135* 137 - 147 mEq/L Final  . Potassium 04/05/2014 4.0  3.7 - 5.3 mEq/L Final  . Chloride 04/05/2014 98  96 - 112 mEq/L Final  . CO2 04/05/2014 20  19 - 32 mEq/L Final  . Glucose, Bld 04/05/2014 323* 70 - 99 mg/dL Final  . BUN  04/05/2014 10  6 - 23 mg/dL Final  . Creatinine, Ser 04/05/2014 0.66  0.50 - 1.10 mg/dL Final  . Calcium 04/05/2014 10.2  8.4 - 10.5 mg/dL Final  . GFR calc non Af Amer 04/05/2014 >90  >90 mL/min Final  . GFR calc Af Amer 04/05/2014 >90  >90 mL/min Final   Comment: (NOTE)                          The eGFR has been calculated using the CKD EPI equation.                          This calculation has not been validated in all clinical situations.                          eGFR's persistently <90 mL/min signify possible Chronic Kidney                          Disease.  . WBC  04/05/2014 16.4* 4.0 - 10.5 K/uL Final  . RBC 04/05/2014 4.73  3.87 - 5.11 MIL/uL Final  . Hemoglobin 04/05/2014 15.0  12.0 - 15.0 g/dL Final  . HCT 04/05/2014 41.9  36.0 - 46.0 % Final  . MCV 04/05/2014 88.6  78.0 - 100.0 fL Final  . MCH 04/05/2014 31.7  26.0 - 34.0 pg Final  . MCHC 04/05/2014 35.8  30.0 - 36.0 g/dL Final  . RDW 04/05/2014 12.9  11.5 - 15.5 % Final  . Platelets 04/05/2014 268  150 - 400 K/uL Final  . Sodium 04/05/2014 136* 137 - 147 mEq/L Final  . Potassium 04/05/2014 3.9  3.7 - 5.3 mEq/L Final  . Chloride 04/05/2014 100  96 - 112 mEq/L Final  . BUN 04/05/2014 8  6 - 23 mg/dL Final  . Creatinine, Ser 04/05/2014 0.80  0.50 - 1.10 mg/dL Final  . Glucose, Bld 04/05/2014 324* 70 - 99 mg/dL Final  . Calcium, Ion 04/05/2014 1.31* 1.12 - 1.23 mmol/L Final  . TCO2 04/05/2014 21  0 - 100 mmol/L Final  . Hemoglobin 04/05/2014 15.3* 12.0 - 15.0 g/dL Final  . HCT 04/05/2014 45.0  36.0 - 46.0 % Final      Medication List       This list is accurate as of: 05/08/14  2:05 PM.  Always use your most recent med list.               amitriptyline 150 MG tablet  Commonly known as:  ELAVIL     aspirin 325 MG tablet  Take 1 tablet (325 mg total) by mouth daily.     atorvastatin 40 MG tablet  Commonly known as:  LIPITOR  Take 1 tablet (40 mg total) by mouth daily.     Canagliflozin 100 MG Tabs  Commonly known as:  INVOKANA  Take 1 tablet (100 mg total) by mouth daily.     chlorproMAZINE 100 MG tablet  Commonly known as:  THORAZINE  Take 100-200 mg by mouth at bedtime.     diazepam 10 MG tablet  Commonly known as:  VALIUM  Take 10 mg by mouth 3 (three) times daily as needed for anxiety.     esomeprazole 40 MG capsule  Commonly known as:  NEXIUM  Take 40 mg by mouth 2 (two) times daily before a meal.     freestyle  lancets  Test blood sugar 3 times daily as instructed. Dx code: 250.62     glucose blood test strip  Commonly known as:  FREESTYLE LITE  Use as  instructed to check blood sugar 2 times daily dx code 250.02     Insulin Glargine 100 UNIT/ML Solostar Pen  Commonly known as:  LANTUS SOLOSTAR  Inject 15 units daily     Insulin Pen Needle 32G X 4 MM Misc  Use as directed     Krill Oil 300 MG Caps  Take 3 capsules by mouth daily.     levothyroxine 25 MCG tablet  Commonly known as:  SYNTHROID, LEVOTHROID     lidocaine 2 % jelly  Commonly known as:  XYLOCAINE     lidocaine 5 % ointment  Commonly known as:  XYLOCAINE  Apply 1 application topically as needed for moderate pain.     lidocaine 5 %  Commonly known as:  LIDODERM  Place 1 patch onto the skin daily. Remove & Discard patch within 12 hours or as directed by MD     magnesium oxide 400 MG tablet  Commonly known as:  MAG-OX  Take 400 mg by mouth daily.     NOVOLOG 100 UNIT/ML injection  Generic drug:  insulin aspart  TO USE WITH V-GO PUMP     oxyCODONE 5 MG immediate release tablet  Commonly known as:  Oxy IR/ROXICODONE     oxyCODONE-acetaminophen 10-325 MG per tablet  Commonly known as:  PERCOCET  Take 1 tablet by mouth 3 (three) times daily as needed for pain.     oxyCODONE-acetaminophen 5-325 MG/5ML solution  Commonly known as:  ROXICET  Take by mouth every 4 (four) hours as needed for severe pain.     promethazine 25 MG tablet  Commonly known as:  PHENERGAN  Take 25 mg by mouth every 6 (six) hours as needed for nausea or vomiting.     ramipril 5 MG capsule  Commonly known as:  ALTACE  Take 5 mg by mouth daily.     rizatriptan 10 MG tablet  Commonly known as:  MAXALT  Take 1 tablet (10 mg total) by mouth as needed for migraine. May repeat in 2 hours if needed     spironolactone 25 MG tablet  Commonly known as:  ALDACTONE  Take 1 tablet (25 mg total) by mouth daily.     tizanidine 2 MG capsule  Commonly known as:  ZANAFLEX  2 mg po qhs for one week then increase to 66m po qhs     topiramate 100 MG tablet  Commonly known as:  TOPAMAX  Take 1  tablet (100 mg total) by mouth 2 (two) times daily.     traMADol 50 MG tablet  Commonly known as:  ULTRAM  Take 2 tablets (100 mg total) by mouth every 6 (six) hours as needed.     V-GO 40 Kit  by Does not apply route.     Vitamin D (Ergocalciferol) 50000 UNITS Caps capsule  Commonly known as:  DRISDOL  Take 50,000 Units by mouth every 7 (seven) days. Take on Sunday        Allergies:  Allergies  Allergen Reactions  . Latex Itching, Rash and Other (See Comments)    Pt states she cannot use condoms - cause an infection.  Use of latex on skin is okay.  Tape causes rash  . Ibuprofen Other (See Comments)    headaches  . Sweetening Enhancer [Flavoring Agent] Nausea And  Vomiting    And headaches  . Tetracyclines & Related Nausea And Vomiting    Yeast infections  . Trazodone And Nefazodone Other (See Comments)    Hallucinations   . Triazolam Other (See Comments)    hallucinations  . Aspartame And Phenylalanine Nausea And Vomiting    headaches    Past Medical History  Diagnosis Date  . Hypertension   . Hyperlipemia   . GERD (gastroesophageal reflux disease)   . Insomnia   . SUI (stress urinary incontinence, female) S/P SLING 12-29-2011  . SOB (shortness of breath) on exertion   . Bipolar disorder   . Neurogenic bladder   . Hypercholesterolemia   . Gastroparesis   . Ulcer   . MRSA (methicillin resistant Staphylococcus aureus)   . Chronic pain   . Edema   . Chest pain     a. 2008 Cath: normal cors;  b. 12/2013 Lexi CL: EF 47%, no ischemia/infarct.  . Anxiety   . Vertigo   . COPD (chronic obstructive pulmonary disease)   . Cholecystitis   . Hypersomnia   . Tobacco abuse   . Claudication     a. 12/2013 ABI's: R 0.97, L 0.94.  [name] Palpitations   . Anginal pain   . Pneumonia     "twice, I think" (02/12/2014)  . Sleep apnea     didn't tolerate cpap (02/12/2014)  . Type II diabetes mellitus   . History of blood transfusion 1986    "related to lost a child" (02/12/2014)   . H/O hiatal hernia   . BWLSLHTD(428.7)     "weekly" (02/12/2014)  . Stroke 02/12/2014    "eyesight is messed up; not steady on my feet" (02/12/2014)  . Arthritis     "qwhere" (02/12/2014)  . DJD (degenerative joint disease)   . Chronic back pain   . Depression   . Personality disorder     Past Surgical History  Procedure Laterality Date  . Knee arthroscopy w/ allograft impant Left     graft x 2  . Foot surgery Bilateral   . Anterior cervical decomp/discectomy fusion  2000    C6 - 7  . Pubovaginal sling  12/29/2011    Procedure: Gaynelle Arabian;  Surgeon: Reece Packer, MD;  Location: [facility];  Service: Urology;  Laterality: N/A;  cysto and sparc sling   . Lumbar fusion      "cage in my spine"  . Total abdominal hysterectomy w/ bilateral salpingoophorectomy  1997  . Multiple laparoscopies for endometriosis    . Cesarean section  1989; 1992  . Repeat reconstruction acl left knee/ screws removed  03-28-2000    CADAVER GRAFT  . 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 Knee surgery      TOTAL 8 SURG'S  . Cystoscopy with injection  05/04/2012    Procedure: CYSTOSCOPY WITH INJECTION;  Surgeon: Reece Packer, MD;  Location: [facility];  Service: Urology;  Laterality: N/A;  MACROPLASTIQUE INJECTION  . Cystoscopy with injection  08/28/2012    Procedure: CYSTOSCOPY WITH INJECTION;  Surgeon: Reece Packer, MD;  Location: [facility];  Service: Urology;  Laterality: N/A;  cysto and macroplastique   . Carpal tunnel release Right 04-25-2013  . Cysto N/A 04/30/2013    Procedure: CYSTOSCOPY;  Surgeon: Reece Packer, MD;  Location: [facility];  Service: Urology;  Laterality: N/A;  . Pubovaginal sling N/A 04/30/2013    Procedure: REMOVAL OF VAGINAL MESH;  Surgeon: Reece Packer, MD;  Location: [facility];  Service: Urology;  Laterality: N/A;  . Abdominal hysterectomy    . Hernia repair  ?1996    "stomach"  . Cardiac  catheterization  05-22-2008   DR SKAINS    NO SIGNIFECANT CAD/ NORMAL LV/  EF 65%/  NO WALL MOTION ABNORMALITIES  . Cardiac catheterization  02/12/2014    Family History  Problem Relation Age of Onset  . Hypertension Mother   . Diabetes Mother   . Cancer - Other Mother   . Cancer - Other Father   . Heart attack Father     Social History:  reports that she has been smoking Cigarettes and E-cigarettes.  She has a 41 pack-year smoking history. She has never used smokeless tobacco. She reports that she drinks alcohol. She reports that she does not use illicit drugs.  Review of Systems -   Candida vaginitis recently with only mild irritation, treated by urologist  She has history of high triglycerides, last level was 280 and has not been taking her fenofibrate. However he is taking Lipitor and fish oil. Triglycerides are still high probably from her high glucose levels  Lab Results  Component Value Date   CHOL 99 04/04/2014   HDL 33.40* 04/04/2014   LDLCALC 3 04/04/2014   LDLDIRECT 81.9 12/13/2013   TRIG 314.0* 04/04/2014   CHOLHDL 3 04/04/2014    Hypothermia: She has not had this again. Not clear of the etiology. Her thyroid levels were normal With taking levothyroxine 25 mcg her TSH was low at 0.21 and was asked to continue by PCP  Hypertension:  patient is on ramipril 5 mg and spironolactone with good control  HYPOKALEMIA: Her potassium is  controlled with starting ramipril. She is not having any cramps.   No recent edema  Painful neuropathy: Was given Lidoderm patches   Examination:   BP 124/78  Pulse 102  Temp(Src) 98.3 F (36.8 C)  Resp 16  Ht '5\' 4"'  (1.626 m)  Wt 201 lb 3.2 oz (91.264 kg)  BMI 34.52 kg/m2  SpO2 97%  Body mass index is 34.52 kg/(m^2).   No ankle edema   Assesment/Plan:   1. Diabetes type 2, uncontrolled   The patient's diabetes control is still poor now because of noncompliance with her diet and drinking very large amounts of drinks with sugar  such as juices See history of present illness for current blood sugar patterns and problems identified A1c has been persistently over 9% recently She is also gaining weight from poor diet management Recommendations made today:  Increase Lantus to 15 units and continue to increase until morning sugar less than 150  Check some readings after meals also  Reduce juices and drink only water or low carbohydrate drinks   Take extra boluses for high readings consistently  Balanced meals with low fat protein  Consider increasing Invokana to 300 mg when she is fully treated for her candidiasis  2. Stop thyroid supplement as her TSH was low with 25 mcg and she does not have any hypothyroidism  Elayne Snare 05/08/2014, 2:05 PM

## 2014-05-08 NOTE — Patient Instructions (Addendum)
Lantus 20 units at bedtime and go up 2 units every 3 days until am sugar < 150 No juices  Stop Levothyroxine

## 2014-05-09 ENCOUNTER — Other Ambulatory Visit: Payer: Self-pay | Admitting: *Deleted

## 2014-05-09 MED ORDER — POTASSIUM CHLORIDE CRYS ER 20 MEQ PO TBCR: 20.0000 meq | EXTENDED_RELEASE_TABLET | Freq: Every day | ORAL | Status: DC

## 2014-05-09 NOTE — Progress Notes (Signed)
Quick Note:  Potassium is low, start K-Dur 20 mEq daily, blood sugar was 427, need to stop juices ______

## 2014-05-14 LAB — FRUCTOSAMINE: FRUCTOSAMINE: 277 umol/L — AB (ref 190–270)

## 2014-05-27 ENCOUNTER — Other Ambulatory Visit: Payer: Self-pay | Admitting: Endocrinology

## 2014-06-11 ENCOUNTER — Ambulatory Visit (INDEPENDENT_AMBULATORY_CARE_PROVIDER_SITE_OTHER): Payer: Medicare Other | Admitting: Neurology

## 2014-06-11 ENCOUNTER — Encounter: Payer: Self-pay | Admitting: Neurology

## 2014-06-11 VITALS — BP 135/85 | HR 102 | Wt 186.0 lb

## 2014-06-11 DIAGNOSIS — G4733 Obstructive sleep apnea (adult) (pediatric): Secondary | ICD-10-CM

## 2014-06-11 DIAGNOSIS — I6350 Cerebral infarction due to unspecified occlusion or stenosis of unspecified cerebral artery: Secondary | ICD-10-CM

## 2014-06-11 MED ORDER — RIZATRIPTAN BENZOATE 10 MG PO TABS: 10.0000 mg | ORAL_TABLET | ORAL | Status: DC | PRN

## 2014-06-11 NOTE — Progress Notes (Signed)
[facility] [address] [place]. [place] [address] [phone]       OFFICE FOLLOW-UP NOTE  [name] Date of Birth:  [birth year] Medical Record Number:  [id number]   HPI: 15 year Caucasian lady seen for first office for visit followup hospital consultation for stroke and 5 at 19. She had cardiac catheterization on 02/12/14 and a few hours later developed sudden onset of diplopia when she woke up from a nap. She denied accompanying vertigo, numbness, dizziness. 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 MRI of the brain showed no large vessel occlusion. Transthoracic echo showed ejection fraction of 45% with hypokinesia of the inferior base and inferolateral walls. Urine drug screen was negative for. Carotid Doppler showed no significant circumflex stenosis. She was found to hyperlipidemia and started on Lipitor 40 mg. Hemoglobin A1c was elevated at 9.1. She'was started an aspirin for stroke prevention and did well and was discharged home. She states she has mildy diminished fine motor skills in the left hand and some numbness but otherwise has no residual deficits from stroke. She developed headaches after returning home initially in the occipital region but last 5 days she has daily headaches off and on in the temples. Headaches sensitive to light sound complained of nausea and occasional vomiting. An excellent disabling and member to work. She has been taking aspirin 625 milligrams which will ease the  headache but not completely perfect. She was carefully the mother. She does have a remote history of migraines and strong family history of migraines. She was  on amitriptyline 150 mg which was discontinued a month ago and perhaps may have contributed to her worseningt  migraines. She also complains of mild intermittent tremors in  her hands.    l. She plans to have an upper GI endoscopy  for reflux and is concerned about stopping aspirin for the procedure. Update 06/11/14 : She returns for followup of her last visit 3 months ago. She states her migraines are much better and have responded well to Maxalt. Her insurance complaint did not cover Relpax and substituted it with Maxalt which seems to work fine for her. She did not tolerate Topamax which he took only for a few weeks and she stated that in fact it made her headaches worsen and she stopped it. She has some new concerns about her vision states that she is not able to see things clearly both eyes particularly when she is trying to watch television or look to the right. She denies double vision but feels that she sees better when she closes either eye individually. She has been evaluated by ophthalmologist Dr. Gershon Crane who feels these may be related to a stroke. Patient also complains of fatigability, tiredness, tremulous as as well as difficulty writing and feels a fine motor skills in the right and has not yet recovered completely. She does have a history of sleep apnea but has not been able to tolerate CPAP in the past. She is willing to be referred to a sleep physician to consider other alternatives. ROS:   14 system review of systems is positive for  fatigue, light sensitivity, high pain and blurred vision, cough, shortness of breath, choking, chest tightness, neck swelling, palpitations, cold and heat intolerance, excessive thirst, constipation, restless leg, insomnia, apnea, snoring, joint pain and swelling, back pain, aching muscles and cramps, walking difficulty, neck pain, Maalox, headache, numbness, weakness, tremors, depression anxiety. PMH:  Past Medical History  Diagnosis Date  . Hypertension   . Hyperlipemia   . GERD (gastroesophageal reflux disease)   . Insomnia   . SUI (stress urinary incontinence, female) S/P SLING 12-29-2011  . SOB (shortness of breath) on exertion   . Bipolar disorder   . Neurogenic bladder   .  Hypercholesterolemia   . Gastroparesis   . Ulcer   . MRSA (methicillin resistant Staphylococcus aureus)   . Chronic pain   . Edema   . Chest pain     a. 2008 Cath: normal cors;  b. 12/2013 Lexi CL: EF 47%, no ischemia/infarct.  . Anxiety   . Vertigo   . COPD (chronic obstructive pulmonary disease)   . Cholecystitis   . Hypersomnia   . Tobacco abuse   . Claudication     a. 12/2013 ABI's: R 0.97, L 0.94.  [name] Palpitations   . Anginal pain   . Pneumonia     "twice, I think" (02/12/2014)  . Sleep apnea     didn't tolerate cpap (02/12/2014)  . Type II diabetes mellitus   . History of blood transfusion 1986    "related to lost a child" (02/12/2014)  . H/O hiatal hernia   . IRSWNIOE(703.5)     "weekly" (02/12/2014)  . Stroke 02/12/2014    "eyesight is messed up; not steady on my feet" (02/12/2014)  . Arthritis     "qwhere" (02/12/2014)  . DJD (degenerative joint disease)   . Chronic back pain   . Depression   . Personality disorder     Social History:  History   Social History  . Marital Status: Divorced    Spouse Name: N/A    Number of Children: N/A  . Years of Education: N/A   Occupational History  . Not on file.   Social History Main Topics  . Smoking status: Current Every Day Smoker -- 1.00 packs/day for 41 years    Types: Cigarettes, E-cigarettes  . Smokeless tobacco: Never Used  . Alcohol Use: 0.0 oz/week     Comment: 02/12/2014 "might have a mixed drink on special occasions"  . Drug Use: No  . Sexual Activity: Yes   Other Topics Concern  . Not on file   Social History Narrative   Lives in [place] with boyfriend.  Does not routinely exercise.    Medications:   Current Outpatient Prescriptions on File Prior to Visit  Medication Sig Dispense Refill  . aspirin 325 MG tablet Take 1 tablet (325 mg total) by mouth daily.      [name] atorvastatin (LIPITOR) 40 MG tablet Take 1 tablet (40 mg total) by mouth daily.  30 tablet  3  . Canagliflozin (INVOKANA) 100 MG TABS Take 1  tablet (100 mg total) by mouth daily.  30 tablet  5  . chlorproMAZINE (THORAZINE) 100 MG tablet Take 100-200 mg by mouth at bedtime.      . diazepam (VALIUM) 10 MG tablet Take 10 mg by mouth 3 (three) times daily as needed for anxiety.      [name] esomeprazole (NEXIUM) 40 MG capsule Take 40 mg by mouth 2 (two) times daily before a meal.      . furosemide (LASIX) 20 MG tablet Take 1 tablet (20 mg total) by mouth as needed.  30 tablet  3  . glucose blood (FREESTYLE LITE) test strip Use as instructed to check blood sugar 2 times daily dx code 250.02  100 each  3  . insulin aspart (NOVOLOG) 100  UNIT/ML injection TO USE WITH V-GO PUMP      . Insulin Disposable Pump (V-GO 40) KIT by Does not apply route.      . Insulin Glargine (LANTUS SOLOSTAR) 100 UNIT/ML Solostar Pen Inject 15 units daily  5 pen  1  . Insulin Pen Needle 32G X 4 MM MISC Use as directed  30 each  1  . INVOKANA 100 MG TABS TAKE 1 TABLET (100 MG TOTAL) BY MOUTH DAILY.  30 tablet  5  . Krill Oil 300 MG CAPS Take 3 capsules by mouth daily.      . Lancets (FREESTYLE) lancets Test blood sugar 3 times daily as instructed. Dx code: 250.62  100 each  2  . lidocaine (LIDODERM) 5 % Place 1 patch onto the skin daily. Remove & Discard patch within 12 hours or as directed by MD  30 patch  0  . lidocaine (XYLOCAINE) 2 % jelly       . lidocaine (XYLOCAINE) 5 % ointment Apply 1 application topically as needed for moderate pain.      . magnesium oxide (MAG-OX) 400 MG tablet Take 400 mg by mouth daily.      [name] oxyCODONE (OXY IR/ROXICODONE) 5 MG immediate release tablet       . oxyCODONE-acetaminophen (PERCOCET) 10-325 MG per tablet Take 1 tablet by mouth 3 (three) times daily as needed for pain.      [name] oxyCODONE-acetaminophen (ROXICET) 5-325 MG/5ML solution Take by mouth every 4 (four) hours as needed for severe pain.      . potassium chloride SA (K-DUR,KLOR-CON) 20 MEQ tablet Take 1 tablet (20 mEq total) by mouth daily.  30 tablet  3  . promethazine  (PHENERGAN) 25 MG tablet Take 25 mg by mouth every 6 (six) hours as needed for nausea or vomiting.      . ramipril (ALTACE) 5 MG capsule Take 5 mg by mouth daily.      [name] spironolactone (ALDACTONE) 25 MG tablet Take 1 tablet (25 mg total) by mouth daily.  30 tablet  2  . tizanidine (ZANAFLEX) 2 MG capsule 2 mg po qhs for one week then increase to 32m po qhs  60 capsule  1  . traMADol (ULTRAM) 50 MG tablet Take 2 tablets (100 mg total) by mouth every 6 (six) hours as needed.  30 tablet  1  . Vitamin D, Ergocalciferol, (DRISDOL) 50000 UNITS CAPS capsule Take 50,000 Units by mouth every 7 (seven) days. Take on Sunday       No current facility-administered medications on file prior to visit.    Allergies:   Allergies  Allergen Reactions  . Latex Itching, Rash and Other (See Comments)    Pt states she cannot use condoms - cause an infection.  Use of latex on skin is okay.  Tape causes rash  . Ibuprofen Other (See Comments)    headaches  . Sweetening Enhancer [Flavoring Agent] Nausea And Vomiting    And headaches  . Tetracyclines & Related Nausea And Vomiting    Yeast infections  . Trazodone And Nefazodone Other (See Comments)    Hallucinations   . Triazolam Other (See Comments)    hallucinations  . Aspartame And Phenylalanine Nausea And Vomiting    headaches    Physical Exam General: well developed, well nourished, seated, in no evident distress Head: head normocephalic and atraumatic. Orohparynx benign Neck: supple with no carotid or supraclavicular bruits Cardiovascular: regular rate and rhythm, no murmurs Musculoskeletal: no deformity. Bilateral scars  on shoulders from prior surgery Skin:  no rash/petichiae Vascular:  Normal pulses all extremities Filed Vitals:   06/11/14 1534  BP: 135/85  Pulse: 102   Neurologic Exam Mental Status: Awake and fully alert. Oriented to place and time. Recent and remote memory intact. Attention span, concentration and fund of knowledge  appropriate. Mood and affect appropriate.  Cranial Nerves: Fundoscopic exam reveals sharp disc margins. Pupils equal, briskly reactive to light. Extraocular movements full without nystagmus. Visual fields full to confrontation. Hearing intact. Facial sensation intact. Face, tongue, palate moves normally and symmetrically.  Motor: Normal bulk and tone. Normal strength in all tested extremity muscles.Marland Kitchendiminsihed fine finger movements on right. No resting, or cogwheel rigidity. Fine intermittent action tremor of left hand more than right. No bradykinesia. Glabellar tap is negative. Sensory.: subjective diminished  touch and pinprick  on left and normal and vibratory sensation.  Coordination: Rapid alternating movements normal in all extremities. Finger-to-nose and heel-to-shin performed accurately bilaterally. Gait and Station: Arises from chair without difficulty. Stance is normal. Gait demonstrates normal stride length and balance . Able to heel, toe and tandem walk without difficulty.  Reflexes: 1+ and symmetric. Toes downgoing.   NIHSS  1 Modified Rankin  1   ASSESSMENT: 13 year Caucasian lady who with bilateral embolic infarcts following cardiac catheterization in February 2015 with multiple vascular risk factors of hypertension, hyperlipidemia, diabetes, sleep apnea, heart failure and tobacco abuse.Migraine headaches now doing well on Maxalt    PLAN:  I had long discussion the patient with regards to her migraine headaches which now seem to be under better control   on Maxalt. She has persistent visual symptoms as well as gait imbalance and mild hand weakness and tremors which are residual effects from a stroke. Clinically lot of her symptoms of tiredness and memory loss may be related to untreated sleep apnea and she has had problems with compliance with CPAP in the past. Referral 2 Dr. Ardeth Sportsman for consult to discuss  alternative treatment options for sleep apnea. Continue aspirin for  stroke prevention and strict control of diabetes with hemoglobin A1c goal 6.5, lipids with LDL cholesterol goal below 70 mg percent and hypertension with pressure goal below 130/90. Return for followup with exam, NP in 6 months or earlier if necessary    Note: This document was prepared with digital dictation and possible smart phrase technology. Any transcriptional errors that result from this process are unintentional

## 2014-06-11 NOTE — Patient Instructions (Addendum)
I had long discussion the patient with regards to her migraine headaches which now seem to be under better control   on Maxalt. She has persistent visual symptoms as well as gait imbalance and mild hand weakness and tremors which are residual effects from a stroke. Clinically lot of her symptoms of tiredness and memory loss may be related to untreated sleep apnea and she has had problems with compliance with CPAP in the past. Referral 2 Dr. Ardeth Sportsman for consult to discuss  alternative treatment options for sleep apnea. Continue aspirin for stroke prevention and strict control of diabetes with hemoglobin A1c goal 6.5, lipids with LDL cholesterol goal below 70 mg percent and hypertension with pressure goal below 130/90. Return for followup with exam, NP in 6 months or earlier if necessary

## 2014-07-04 ENCOUNTER — Encounter (INDEPENDENT_AMBULATORY_CARE_PROVIDER_SITE_OTHER): Payer: Self-pay

## 2014-07-04 ENCOUNTER — Encounter: Payer: Self-pay | Admitting: Neurology

## 2014-07-04 ENCOUNTER — Ambulatory Visit (INDEPENDENT_AMBULATORY_CARE_PROVIDER_SITE_OTHER): Payer: Medicare Other | Admitting: Neurology

## 2014-07-04 VITALS — BP 142/91 | HR 99 | Resp 16 | Ht 66.5 in | Wt 180.0 lb

## 2014-07-04 DIAGNOSIS — R0989 Other specified symptoms and signs involving the circulatory and respiratory systems: Secondary | ICD-10-CM

## 2014-07-04 DIAGNOSIS — R0683 Snoring: Secondary | ICD-10-CM

## 2014-07-04 DIAGNOSIS — I639 Cerebral infarction, unspecified: Secondary | ICD-10-CM

## 2014-07-04 DIAGNOSIS — I6340 Cerebral infarction due to embolism of unspecified cerebral artery: Secondary | ICD-10-CM

## 2014-07-04 DIAGNOSIS — G43909 Migraine, unspecified, not intractable, without status migrainosus: Secondary | ICD-10-CM

## 2014-07-04 DIAGNOSIS — E669 Obesity, unspecified: Secondary | ICD-10-CM

## 2014-07-04 DIAGNOSIS — R0609 Other forms of dyspnea: Secondary | ICD-10-CM

## 2014-07-04 DIAGNOSIS — G4733 Obstructive sleep apnea (adult) (pediatric): Secondary | ICD-10-CM

## 2014-07-04 HISTORY — DX: Obstructive sleep apnea (adult) (pediatric): G47.33

## 2014-07-04 MED ORDER — ALPRAZOLAM 1 MG PO TABS: 1.0000 mg | ORAL_TABLET | Freq: Every evening | ORAL | Status: DC | PRN

## 2014-07-04 NOTE — Progress Notes (Addendum)
[facility] [address] [place]. [place] [address] [phone]       [place]   [name] Date of Birth:  10/23/1966 Medical Record Number:  [id number]   HPI: 55 year Caucasian female patient of Dr. Clydene Fake  seen  first time for a sleep consult . [name] reports that she was recently seen for a stroke in the multiple hospital setting per Dr. Leonie Man followed her. The details of his stroke workup are listed beyond. There were no loose focal deficits noted on her last neurologic examination. What became evident is that the patient is at high risk for sleep apnea and actually had been diagnosed with sleep apnea in the past. She recalled having a tested resting on hospital many years ago that electronic medical records were not yet existing. She felt that CPAP is very restraining an invasive.  She felt he could not sleep with her CPAP machine and return to after trying it briefly at all. I am not aware to what degree the sleep apnea was diagnosed in am also not aware what kind of mask was used. But given that this may be a decade later now I think that we have other options to treat this patient but it isn't tolerable to patient claustrophobia. One is a nasal pillow mouth problem and a full face and another can be an old we'll titrate her air sense machine that is a gentle form of supplying positive airway pressure. It is also possible that the patient has such mild apnea the CPAP does not need to be her primary treatment option.  The patient endorsed the Epworth Sleepiness Scale at 5 points in the fatigue severity scale at 50 points. Her fatigue is extremely high.  Sleep habits the patient reports that she always takes sleeping pills , which are helping her to fall asleep. She takes  Thorazine ans exanaplan , takes her dose  between 9 and 9:30 PM at night. Usually she sleeps finally at about 1 AM - under the influence of her pills. Than her sleep is  fragmented by nocturia, going to the bathroom 2-3 times at night. She lives alone, her fiancee left her Friday , he had reported severe snoring and that the patient was unable to be aroused/ awoken . He reported apneas. She wakes always at 6 AM, but goes to back to sleep until 10.30 .  The patient has been taking naps in daytime, she sometimes feels her mind is racing too much to fall asleep. She does not remember dreams. She rises at 10.30 and has neither breakfast nor cofee. She eats once a day.  She used to work second and third shift in her twenties. 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    [name] in her daughter . Her son is healthy.     Past history :  Dr. Leonie Man followed her during a  hospital consultation for stroke in 2 / 2015. She had cardiac catheterization on 02/12/14 and a few hours later developed sudden onset of diplopia when she woke up from a nap. She denied accompanying vertigo, numbness, dizziness. 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 MRI of the brain showed no large vessel occlusion. Transthoracic echo showed ejection fraction of 45% with hypokinesia of the inferior base and inferolateral walls. Urine drug screen was negative for. Carotid Doppler showed no significant  circumflex stenosis. She was found to hyperlipidemia and started on Lipitor 40 mg. Hemoglobin A1c was elevated at 9.1. She'was started an aspirin for stroke prevention and did well and was discharged home. She states she has mildy diminished fine motor skills in the left hand and some numbness but otherwise has no residual deficits from stroke. She developed headaches after returning home initially in the occipital region but last 5 days she has daily headaches off and on in the temples. Headaches sensitive to light sound complained of nausea and occasional vomiting. An excellent disabling and  member to work. She has been taking aspirin 625 milligrams which will ease the  headache but not completely perfect. She was carefully the mother. She does have a remote history of migraines and strong family history of migraines. She was  on amitriptyline 150 mg which was discontinued a month ago and perhaps may have contributed to her worseningt  migraines. She also complains of mild intermittent tremors in her hands. She plans to have an upper GI endoscopy for reflux and is concerned about stopping aspirin for the procedure. ON  06/11/14 ,she returns for followup .She states her migraines are much better and have responded well to Maxalt. Her insurance complaint did not cover Relpax and substituted it with Maxalt which seems to work fine for her. She did not tolerate Topamax which he took only for a few weeks and she stated that in fact it made her headaches worsen and she stopped it.  She has some new concerns about her vision states that she is not able to see things clearly both eyes particularly when she is trying to watch television or look to the right. She denies double vision but feels that she sees better when she closes either eye individually. She has been evaluated by ophthalmologist Dr. Gershon Crane who feels these may be related to a stroke. Patient also complains of fatigability, tiredness, tremulous as as well as difficulty writing and feels a fine motor skills in the right and has not yet recovered completely. She does have a history of sleep apnea but has not been able to tolerate CPAP in the past. She is willing to be referred to a sleep physician to consider other alternatives.   ROS:   14 system review of systems is positive for snoring, depression- severe, handwriting changes, weakness all over , diplopia- vertical , walking unsteady , headache, numbness, weakness, tremors, depression anxiety, fatigue, light sensitivity, pain and blurred vision, cough, shortness of breath, choking, chest  tightness, neck swelling, palpitations, cold and heat intolerance, excessive thirst, constipation, restless leg, insomnia, apnea, snoring, joint pain and swelling, back pain, aching muscles and cramps, neck pain,  PMH:  Past Medical History  Diagnosis Date  . Hypertension   . Hyperlipemia   . GERD (gastroesophageal reflux disease)   . Insomnia   . SUI (stress urinary incontinence, female) S/P SLING 12-29-2011  . SOB (shortness of breath) on exertion   . Bipolar disorder   . Neurogenic bladder   . Hypercholesterolemia   . Gastroparesis   . Ulcer   . MRSA (methicillin resistant Staphylococcus aureus)   . Chronic pain   . Edema   . Chest pain     a. 2008 Cath: normal cors;  b. 12/2013 Lexi CL: EF 47%, no ischemia/infarct.  . Anxiety   . Vertigo   . COPD (chronic obstructive pulmonary disease)   . Cholecystitis   . Hypersomnia   . Tobacco abuse   . Claudication  a. 12/2013 ABI's: R 0.97, L 0.94.  [name] Palpitations   . Anginal pain   . Pneumonia     "twice, I think" (02/12/2014)  . Sleep apnea     didn't tolerate cpap (02/12/2014)  . Type II diabetes mellitus   . History of blood transfusion 1986    "related to lost a child" (02/12/2014)  . H/O hiatal hernia   . QASUORVI(153.7)     "weekly" (02/12/2014)  . Stroke 02/12/2014    "eyesight is messed up; not steady on my feet" (02/12/2014)  . Arthritis     "qwhere" (02/12/2014)  . DJD (degenerative joint disease)   . Chronic back pain   . Depression   . Personality disorder     Social History:  History   Social History  . Marital Status: Divorced    Spouse Name: N/A    Number of Children: 2  . Years of Education: 6th   Occupational History  . Not on file.   Social History Main Topics  . Smoking status: Current Every Day Smoker -- 1.00 packs/day for 41 years    Types: Cigarettes, E-cigarettes  . Smokeless tobacco: Never Used  . Alcohol Use: 0.0 oz/week     Comment: 02/12/2014 "might have a mixed drink on special  occasions"  . Drug Use: No  . Sexual Activity: Yes   Other Topics Concern  . Not on file   Social History Narrative   Patient is divorced and lives alone.   Patient has two adult children.   Patient is disabled.   Patient has a 6th grade education.   Patient is right-handed.   Patient drinks 2- 3 liters of soda and drinks tea but not everyday.    Does not routinely exercise.    Medications:   Current Outpatient Prescriptions on File Prior to Visit  Medication Sig Dispense Refill  . aspirin 325 MG tablet Take 1 tablet (325 mg total) by mouth daily.      [name] atorvastatin (LIPITOR) 40 MG tablet Take 1 tablet (40 mg total) by mouth daily.  30 tablet  3  . Canagliflozin (INVOKANA) 100 MG TABS Take 1 tablet (100 mg total) by mouth daily.  30 tablet  5  . chlorproMAZINE (THORAZINE) 100 MG tablet Take 100-200 mg by mouth at bedtime.      . clonazePAM (KLONOPIN) 2 MG tablet       . diazepam (VALIUM) 10 MG tablet Take 10 mg by mouth 3 (three) times daily as needed for anxiety.      [name] esomeprazole (NEXIUM) 40 MG capsule Take 40 mg by mouth 2 (two) times daily before a meal.      . furosemide (LASIX) 20 MG tablet Take 1 tablet (20 mg total) by mouth as needed.  30 tablet  3  . gabapentin (NEURONTIN) 300 MG capsule       . glucose blood (FREESTYLE LITE) test strip Use as instructed to check blood sugar 2 times daily dx code 250.02  100 each  3  . insulin aspart (NOVOLOG) 100 UNIT/ML injection TO USE WITH V-GO PUMP      . Insulin Disposable Pump (V-GO 40) KIT by Does not apply route.      . Insulin Glargine (LANTUS SOLOSTAR) 100 UNIT/ML Solostar Pen Inject 15 units daily  5 pen  1  . Insulin Pen Needle 32G X 4 MM MISC Use as directed  30 each  1  . INVOKANA 100 MG TABS TAKE 1 TABLET (100  MG TOTAL) BY MOUTH DAILY.  30 tablet  5  . Krill Oil 300 MG CAPS Take 3 capsules by mouth daily.      . Lancets (FREESTYLE) lancets Test blood sugar 3 times daily as instructed. Dx code: 250.62  100 each  2  .  magnesium oxide (MAG-OX) 400 MG tablet Take 400 mg by mouth daily.      [name] oxyCODONE (OXY IR/ROXICODONE) 5 MG immediate release tablet       . oxyCODONE-acetaminophen (PERCOCET) 10-325 MG per tablet Take 1 tablet by mouth 3 (three) times daily as needed for pain.      . promethazine (PHENERGAN) 25 MG tablet Take 25 mg by mouth every 6 (six) hours as needed for nausea or vomiting.      . ramipril (ALTACE) 5 MG capsule Take 5 mg by mouth daily.      . rizatriptan (MAXALT) 10 MG tablet Take 1 tablet (10 mg total) by mouth as needed for migraine. May repeat in 2 hours if needed  10 tablet  3  . spironolactone (ALDACTONE) 25 MG tablet Take 1 tablet (25 mg total) by mouth daily.  30 tablet  2  . tizanidine (ZANAFLEX) 2 MG capsule 2 mg po qhs for one week then increase to 66m po qhs  60 capsule  1  . traMADol (ULTRAM) 50 MG tablet Take 2 tablets (100 mg total) by mouth every 6 (six) hours as needed.  30 tablet  1  . Vitamin D, Ergocalciferol, (DRISDOL) 50000 UNITS CAPS capsule Take 50,000 Units by mouth every 7 (seven) days. Take on Sunday       No current facility-administered medications on file prior to visit.    Allergies:   Allergies  Allergen Reactions  . Latex Itching, Rash and Other (See Comments)    Pt states she cannot use condoms - cause an infection.  Use of latex on skin is okay.  Tape causes rash  . Ibuprofen Other (See Comments)    headaches  . Sweetening Enhancer [Flavoring Agent] Nausea And Vomiting    And headaches  . Tetracyclines & Related Nausea And Vomiting    Yeast infections  . Trazodone And Nefazodone Other (See Comments)    Hallucinations   . Triazolam Other (See Comments)    hallucinations  . Aspartame And Phenylalanine Nausea And Vomiting    headaches    Physical Exam General: well developed, well nourished, seated, masked face, appears abulic , almost catatonic.  Head: head normocephalic and atraumatic. Orohparynx benign, mallompati 2- very red tongue (  glossitis? ) , thrush .  Neck: supple with no carotid or supraclavicular bruits, neck circumference is 14 inches,  Cardiovascular: irregular rate , patient noted palpitations.  Musculoskeletal: no deformity. Bilateral scars on shoulders from prior surgery  Skin:  no rash/petichiae- tattoed everywhere, Vascular:  Normal pulses all extremities Filed Vitals:   07/04/14 1417  BP: 142/91  Pulse: 99  Resp: 16   Neurologic Exam Mental Status: Strained voice, raspy, minimal facial expression.  Oriented to place and time. Recent and remote memory intact. Attention span, concentration and fund of knowledge appropriate. Mood and affect  Abulia  Cranial Nerves: Fundoscopic exam reveals sharp disc margins.  Extraocular movements full without nystagmus. Visual fields full to confrontation. Hearing intact. Facial sensation intact. Face, tongue, palate moves normally and symmetrically.  Motor: Normal bulk and tone. Normal strength in all tested extremity muscles.diminsihed fine finger movements on right. No resting, or cogwheel rigidity. Fine intermittent action tremor  of left hand more than right. No bradykinesia. Glabellar tap is negative. Sensory.: subjective diminished  touch and pinprick  on left and normal and vibratory sensation.  Coordination: Rapid alternating movements normal in all extremities. Finger-to-nose and heel-to-shin performed accurately bilaterally. Gait and Station: Arises from chair without difficulty. Stance is normal. Gait demonstrates normal stride length and balance . Able to heel, toe and tandem walk without difficulty.  Reflexes: 1+ and symmetric. Toes downgoing.    ASSESSMENT: 4 year Caucasian lady who with bilateral embolic infarcts following cardiac catheterization in February 2015 with multiple vascular risk factors of hypertension, hyperlipidemia, diabetes, sleep apnea, heart failure and tobacco abuse.Migraine headaches now doing well on Maxalt. OSA untreated since  diagnosis in 2005 ?     PLAN:  I had long discussion the patient with regards to her untreated condition, likely  OSA, which she is likely to have still. Her stroke did bring an additional risk factor into the mix. She smokes , too. She is severely depressed and abulic.  Her migraine headaches could be related to OSA,  which now seem to be under better control on Maxalt.  Clinically lot of her symptoms of tiredness and memory loss may be related to untreated sleep apnea and she has had problems with compliance with CPAP in the past. Continue aspirin for stroke prevention and strict control of diabetes with hemoglobin A1c goal 6.5, lipids with LDL cholesterol goal below 70 mg percent and hypertension with pressure goal below 130/90. Return for followup with exam,  With Lyn Lam ,NP after sleep study  In 2 months or earlier if necessary    Note: This document was prepared with digital dictation and possible smart phrase technology. Any transcriptional errors that result from this process are unintentional

## 2014-07-04 NOTE — Addendum Note (Signed)
Addended by: Larey Seat on: 07/04/2014 02:55 PM   Modules accepted: Orders

## 2014-07-04 NOTE — Patient Instructions (Signed)
Sleep Apnea  Sleep apnea is a sleep disorder characterized by abnormal pauses in breathing while you sleep. When your breathing pauses, the level of oxygen in your blood decreases. This causes you to move out of deep sleep and into light sleep. As a result, your quality of sleep is poor, and the system that carries your blood throughout your body (cardiovascular system) experiences stress. If sleep apnea remains untreated, the following conditions can develop:  High blood pressure (hypertension).  Coronary artery disease.  Inability to achieve or maintain an erection (impotence).  Impairment of your thought process (cognitive dysfunction). There are three types of sleep apnea: 1. Obstructive sleep apnea--Pauses in breathing during sleep because of a blocked airway. 2. Central sleep apnea--Pauses in breathing during sleep because the area of the brain that controls your breathing does not send the correct signals to the muscles that control breathing. 3. Mixed sleep apnea--A combination of both obstructive and central sleep apnea. RISK FACTORS The following risk factors can increase your risk of developing sleep apnea:  Being overweight.  Smoking.  Having narrow passages in your nose and throat.  Being of older age.  Being female.  Alcohol use.  Sedative and tranquilizer use.  Ethnicity. Among individuals younger than 35 years, African Americans are at increased risk of sleep apnea. SYMPTOMS   Difficulty staying asleep.  Daytime sleepiness and fatigue.  Loss of energy.  Irritability.  Loud, heavy snoring.  Morning headaches.  Trouble concentrating.  Forgetfulness.  Decreased interest in sex. DIAGNOSIS  In order to diagnose sleep apnea, your caregiver will perform a physical examination. Your caregiver may suggest that you take a home sleep test. Your caregiver may also recommend that you spend the night in a sleep lab. In the sleep lab, several monitors record  information about your heart, lungs, and brain while you sleep. Your leg and arm movements and blood oxygen level are also recorded. TREATMENT The following actions may help to resolve mild sleep apnea:  Sleeping on your side.   Using a decongestant if you have nasal congestion.   Avoiding the use of depressants, including alcohol, sedatives, and narcotics.   Losing weight and modifying your diet if you are overweight. There also are devices and treatments to help open your airway:  Oral appliances. These are custom-made mouthpieces that shift your lower jaw forward and slightly open your bite. This opens your airway.  Devices that create positive airway pressure. This positive pressure "splints" your airway open to help you breathe better during sleep. The following devices create positive airway pressure:  Continuous positive airway pressure (CPAP) device. The CPAP device creates a continuous level of air pressure with an air pump. The air is delivered to your airway through a mask while you sleep. This continuous pressure keeps your airway open.  Nasal expiratory positive airway pressure (EPAP) device. The EPAP device creates positive air pressure as you exhale. The device consists of single-use valves, which are inserted into each nostril and held in place by adhesive. The valves create very little resistance when you inhale but create much more resistance when you exhale. That increased resistance creates the positive airway pressure. This positive pressure while you exhale keeps your airway open, making it easier to breath when you inhale again.  Bilevel positive airway pressure (BPAP) device. The BPAP device is used mainly in patients with central sleep apnea. This device is similar to the CPAP device because it also uses an air pump to deliver continuous air pressure   through a mask. However, with the BPAP machine, the pressure is set at two different levels. The pressure when you  exhale is lower than the pressure when you inhale.  Surgery. Typically, surgery is only done if you cannot comply with less invasive treatments or if the less invasive treatments do not improve your condition. Surgery involves removing excess tissue in your airway to create a wider passage way. Document Released: 12/02/2002 Document Revised: 04/08/2013 Document Reviewed: 04/19/2012 Exit[employer] Patient Information 2015 [employer]. This information is not intended to replace advice given to you by your health care provider. Make sure you discuss any questions you have with your health care provider.  

## 2014-07-09 ENCOUNTER — Other Ambulatory Visit: Payer: Self-pay | Admitting: *Deleted

## 2014-07-09 ENCOUNTER — Encounter: Payer: Self-pay | Admitting: Endocrinology

## 2014-07-09 ENCOUNTER — Telehealth: Payer: Self-pay | Admitting: Internal Medicine

## 2014-07-09 ENCOUNTER — Ambulatory Visit (INDEPENDENT_AMBULATORY_CARE_PROVIDER_SITE_OTHER): Payer: Medicare Other | Admitting: Endocrinology

## 2014-07-09 VITALS — BP 132/92 | HR 100 | Temp 97.9°F | Resp 16 | Ht 64.0 in | Wt 182.4 lb

## 2014-07-09 DIAGNOSIS — E876 Hypokalemia: Secondary | ICD-10-CM

## 2014-07-09 DIAGNOSIS — F32A Depression, unspecified: Secondary | ICD-10-CM

## 2014-07-09 DIAGNOSIS — E1149 Type 2 diabetes mellitus with other diabetic neurological complication: Secondary | ICD-10-CM

## 2014-07-09 DIAGNOSIS — R635 Abnormal weight gain: Secondary | ICD-10-CM

## 2014-07-09 DIAGNOSIS — IMO0001 Reserved for inherently not codable concepts without codable children: Secondary | ICD-10-CM

## 2014-07-09 DIAGNOSIS — F329 Major depressive disorder, single episode, unspecified: Secondary | ICD-10-CM

## 2014-07-09 DIAGNOSIS — G43909 Migraine, unspecified, not intractable, without status migrainosus: Secondary | ICD-10-CM

## 2014-07-09 DIAGNOSIS — E1165 Type 2 diabetes mellitus with hyperglycemia: Principal | ICD-10-CM

## 2014-07-09 DIAGNOSIS — F3289 Other specified depressive episodes: Secondary | ICD-10-CM

## 2014-07-09 LAB — BASIC METABOLIC PANEL
BUN: 3 mg/dL — ABNORMAL LOW (ref 6–23)
CO2: 23 mEq/L (ref 19–32)
Calcium: 8.6 mg/dL (ref 8.4–10.5)
Chloride: 94 mEq/L — ABNORMAL LOW (ref 96–112)
Creatinine, Ser: 0.8 mg/dL (ref 0.4–1.2)
GFR: 87.25 mL/min (ref >60.00)
Glucose, Bld: 403 mg/dL — ABNORMAL HIGH (ref 70–99)
POTASSIUM: 2.8 meq/L — AB (ref 3.5–5.1)
SODIUM: 133 meq/L — AB (ref 135–145)

## 2014-07-09 LAB — GLUCOSE, POCT (MANUAL RESULT ENTRY): POC Glucose: 582 mg/dl — AB (ref 70–99)

## 2014-07-09 LAB — T4, FREE: Free T4: 0.9 ng/dL (ref 0.60–1.60)

## 2014-07-09 LAB — HEMOGLOBIN A1C: Hgb A1c MFr Bld: 11.3 % — ABNORMAL HIGH (ref 4.6–6.5)

## 2014-07-09 LAB — TSH: TSH: 0.53 u[IU]/mL (ref 0.35–4.50)

## 2014-07-09 MED ORDER — INSULIN PEN NEEDLE 31G X 5 MM MISC: Status: DC

## 2014-07-09 MED ORDER — INSULIN ASPART 100 UNIT/ML FLEXPEN: 12.0000 [IU] | PEN_INJECTOR | Freq: Three times a day (TID) | SUBCUTANEOUS | Status: DC

## 2014-07-09 MED ORDER — POTASSIUM CHLORIDE 2 MEQ/ML FOR ORAL USE: ORAL | Status: DC

## 2014-07-09 MED ORDER — INSULIN GLARGINE 100 UNIT/ML SOLOSTAR PEN: PEN_INJECTOR | SUBCUTANEOUS | Status: DC

## 2014-07-09 NOTE — Progress Notes (Signed)
Quick Note:  Please let patient know that the potassium is markedly low, she needs to start back on spironolactone daily and for the next 5 days take twice the amount of potassium supplement  Will need CMP level on next visit ______

## 2014-07-09 NOTE — Telephone Encounter (Signed)
Patient called at home re potassium of 2.8.  Advised to start liquid potassium  20 ml   tid for 2 days, then once daily starting on Friday.  Liquid potassium chloride sent to her [facility].  Advised to have a Repeat BMET on Monday

## 2014-07-09 NOTE — Patient Instructions (Signed)
Check your sugars at least every other day, some on waking up and some after meals  LANTUS insulin: Start today, injecting 46 units once daily at the same time everyday If the morning sugar is still over 200 increase the dose to 50 units  NOVOLOG insulin: Take 12 units at every meal. If the blood sugar is high at take extra doses at any time as follows: Glucose over 200 = 2 more units Glucose over 300 = extra 5 units Glucose over 400 = extra 10 units  Call your psychologist for treatment of depression right away May take liquid potassium

## 2014-07-09 NOTE — Progress Notes (Signed)
Patient ID: [name], female   DOB: 04-Jun-1966, 48 y.o.   MRN: [id number]   Reason for Appointment: Diabetes follow-up   History of Present Illness:   Diagnosis: Type 2 diabetes mellitus, date of diagnosis: 2004.  PAST history: She has been treated mostly with insulin since about a year after diagnosis. She has had difficulty with consistent compliance with diet and also compliance with self care including glucose monitoring over the years. She had been mostly treated with basal insulin. Also had been tried on mealtime insulin but she would be noncompliant with this. Was tried on Prandin for mealtime control but difficult to judge efficacy because of lack of postprandial monitoring. Was given Victoza to start in 2011 but did not follow up after this.She was tried on premixed insulin but this did not help her control, mostly because of noncompliance with the doses. She had been using the V- go pump and had better control initially and was better compliant with the daily routine and boluses. She has had frequent education visits also.   RECENT history:   She has now stopped using her V.-go pump because of discomfort at the site of application However she is also significantly depressed and has stopped monitoring her blood sugar as well as not paying attention to her diet or work she is drinking. She thinks she is drinking at least 2 L of lemonade a day She does not check her blood sugar partly because of discomfort on the fingertips and cannot get blood from document sites Not clear what her blood sugars are but it is significantly high today Has lost a little weight but does not complain of excessive weakness Also has not taken her Lantus as directed and is asking about taking oral medications for diabetes She does not appear to be benefiting as much from [facility] but was taking 100 mg; not taking this recently except for today Having only mild vaginal candidiasis recently    Glucometer:   FreeStyle.  Blood Glucose readings:  None Hypoglycemia frequency: Never.    Food preferences: eating 1 or 2 meals per day, sandwiches etc.  Physical activity: exercise: Minimal.  Certified Diabetes Educator visit: Most recent:, 2/14.  The diet that the patient has been following is no specific diet; still eating at irregular times; still drinking drinks with sugar claiming she is allergic to artificial sweeteners   Wt Readings from Last 3 Encounters:  07/09/14 182 lb 6.4 oz (82.736 kg)  07/04/14 180 lb (81.647 kg)  06/11/14 186 lb (84.369 kg)   DM labs:  Lab Results  Component Value Date   HGBA1C 9.5* 04/04/2014   HGBA1C 9.1* 01/24/2014   HGBA1C 8.8* 11/06/2013   Lab Results  Component Value Date   MICROALBUR 1.0 04/04/2014   [place] 3 04/04/2014   CREATININE 0.8 9/67/8938    Complications: are: peripheral neuropathy.   Labs:  Office Visit on 07/09/2014  Component Date Value Ref Range Status  . POC Glucose 07/09/2014 582* 70 - 99 mg/dl Final      Medication List       This list is accurate as of: 07/09/14  3:11 PM.  Always use your most recent med list.               ALPRAZolam 1 MG tablet  Commonly known as:  XANAX  Take 1 tablet (1 mg total) by mouth at bedtime as needed for anxiety.     aspirin 325 MG tablet  Take 1 tablet (325  mg total) by mouth daily.     atorvastatin 40 MG tablet  Commonly known as:  LIPITOR  Take 1 tablet (40 mg total) by mouth daily.     Canagliflozin 100 MG Tabs  Commonly known as:  INVOKANA  Take 1 tablet (100 mg total) by mouth daily.     INVOKANA 100 MG Tabs  Generic drug:  Canagliflozin  TAKE 1 TABLET (100 MG TOTAL) BY MOUTH DAILY.     chlorproMAZINE 100 MG tablet  Commonly known as:  THORAZINE  Take 100-200 mg by mouth at bedtime.     clonazePAM 2 MG tablet  Commonly known as:  KLONOPIN     diazepam 10 MG tablet  Commonly known as:  VALIUM  Take 10 mg by mouth 3 (three) times daily as needed for anxiety.      esomeprazole 40 MG capsule  Commonly known as:  NEXIUM  Take 40 mg by mouth 2 (two) times daily before a meal.     freestyle lancets  Test blood sugar 3 times daily as instructed. Dx code: 250.62     furosemide 20 MG tablet  Commonly known as:  LASIX  Take 1 tablet (20 mg total) by mouth as needed.     gabapentin 300 MG capsule  Commonly known as:  NEURONTIN     glucose blood test strip  Commonly known as:  FREESTYLE LITE  Use as instructed to check blood sugar 2 times daily dx code 250.02     Insulin Glargine 100 UNIT/ML Solostar Pen  Commonly known as:  LANTUS SOLOSTAR  Inject 15 units daily     Insulin Pen Needle 32G X 4 MM Misc  Use as directed     KLOR-CON M20 20 MEQ tablet  Generic drug:  potassium chloride SA     Krill Oil 300 MG Caps  Take 3 capsules by mouth daily.     levothyroxine 25 MCG tablet  Commonly known as:  SYNTHROID, LEVOTHROID     lidocaine 5 % ointment  Commonly known as:  XYLOCAINE     magnesium oxide 400 MG tablet  Commonly known as:  MAG-OX  Take 400 mg by mouth daily.     NOVOLOG 100 UNIT/ML injection  Generic drug:  insulin aspart  TO USE WITH V-GO PUMP     oxyCODONE 5 MG immediate release tablet  Commonly known as:  Oxy IR/ROXICODONE     oxyCODONE-acetaminophen 10-325 MG per tablet  Commonly known as:  PERCOCET  Take 1 tablet by mouth 3 (three) times daily as needed for pain.     promethazine 25 MG tablet  Commonly known as:  PHENERGAN  Take 25 mg by mouth every 6 (six) hours as needed for nausea or vomiting.     ramipril 5 MG capsule  Commonly known as:  ALTACE  Take 5 mg by mouth daily.     rizatriptan 10 MG tablet  Commonly known as:  MAXALT  Take 1 tablet (10 mg total) by mouth as needed for migraine. May repeat in 2 hours if needed     spironolactone 25 MG tablet  Commonly known as:  ALDACTONE  Take 1 tablet (25 mg total) by mouth daily.     tizanidine 2 MG capsule  Commonly known as:  ZANAFLEX  2 mg po qhs for one  week then increase to $RemoveBef'4mg'dYAfcqLIDe$  po qhs     traMADol 50 MG tablet  Commonly known as:  ULTRAM  Take 2 tablets (100 mg total)  by mouth every 6 (six) hours as needed.     V-GO 40 Kit  by Does not apply route.     Vitamin D (Ergocalciferol) 50000 UNITS Caps capsule  Commonly known as:  DRISDOL  Take 50,000 Units by mouth every 7 (seven) days. Take on Sunday        Allergies:  Allergies  Allergen Reactions  . Latex Itching, Rash and Other (See Comments)    Pt states she cannot use condoms - cause an infection.  Use of latex on skin is okay.  Tape causes rash  . Ibuprofen Other (See Comments)    headaches  . Sweetening Enhancer [Flavoring Agent] Nausea And Vomiting    And headaches  . Tetracyclines & Related Nausea And Vomiting    Yeast infections  . Trazodone And Nefazodone Other (See Comments)    Hallucinations   . Triazolam Other (See Comments)    hallucinations  . Aspartame And Phenylalanine Nausea And Vomiting    headaches    Past Medical History  Diagnosis Date  . Hypertension   . Hyperlipemia   . GERD (gastroesophageal reflux disease)   . Insomnia   . SUI (stress urinary incontinence, female) S/P SLING 12-29-2011  . SOB (shortness of breath) on exertion   . Bipolar disorder   . Neurogenic bladder   . Hypercholesterolemia   . Gastroparesis   . Ulcer   . MRSA (methicillin resistant Staphylococcus aureus)   . Chronic pain   . Edema   . Chest pain     a. 2008 Cath: normal cors;  b. 12/2013 Lexi CL: EF 47%, no ischemia/infarct.  . Anxiety   . Vertigo   . COPD (chronic obstructive pulmonary disease)   . Cholecystitis   . Hypersomnia   . Tobacco abuse   . Claudication     a. 12/2013 ABI's: R 0.97, L 0.94.  [name] Palpitations   . Anginal pain   . Pneumonia     "twice, I think" (02/12/2014)  . Sleep apnea     didn't tolerate cpap (02/12/2014)  . Type II diabetes mellitus   . History of blood transfusion 1986    "related to lost a child" (02/12/2014)  . H/O hiatal  hernia   . HNPMVAEP(737.5)     "weekly" (02/12/2014)  . Stroke 02/12/2014    "eyesight is messed up; not steady on my feet" (02/12/2014)  . Arthritis     "qwhere" (02/12/2014)  . DJD (degenerative joint disease)   . Chronic back pain   . Depression   . Personality disorder   . OSA (obstructive sleep apnea) 07/04/2014    Past Surgical History  Procedure Laterality Date  . Knee arthroscopy w/ allograft impant Left     graft x 2  . Foot surgery Bilateral   . Anterior cervical decomp/discectomy fusion  2000    C6 - 7  . Pubovaginal sling  12/29/2011    Procedure: Gaynelle Arabian;  Surgeon: Reece Packer, MD;  Location: [facility];  Service: Urology;  Laterality: N/A;  cysto and sparc sling   . Lumbar fusion      "cage in my spine"  . Total abdominal hysterectomy w/ bilateral salpingoophorectomy  1997  . Multiple laparoscopies for endometriosis    . Cesarean section  1989; 1992  . Repeat reconstruction acl left knee/ screws removed  03-28-2000    CADAVER GRAFT  . 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 Knee surgery  TOTAL 8 SURG'S  . Cystoscopy with injection  05/04/2012    Procedure: CYSTOSCOPY WITH INJECTION;  Surgeon: Reece Packer, MD;  Location: [facility];  Service: Urology;  Laterality: N/A;  MACROPLASTIQUE INJECTION  . Cystoscopy with injection  08/28/2012    Procedure: CYSTOSCOPY WITH INJECTION;  Surgeon: Reece Packer, MD;  Location: [facility];  Service: Urology;  Laterality: N/A;  cysto and macroplastique   . Carpal tunnel release Right 04-25-2013  . Cysto N/A 04/30/2013    Procedure: CYSTOSCOPY;  Surgeon: Reece Packer, MD;  Location: [facility];  Service: Urology;  Laterality: N/A;  . Pubovaginal sling N/A 04/30/2013    Procedure: REMOVAL OF VAGINAL MESH;  Surgeon: Reece Packer, MD;  Location: [facility];  Service: Urology;  Laterality: N/A;  . Abdominal hysterectomy    . Hernia repair  ?1996     "stomach"  . Cardiac catheterization  05-22-2008   DR SKAINS    NO SIGNIFECANT CAD/ NORMAL LV/  EF 65%/  NO WALL MOTION ABNORMALITIES  . Cardiac catheterization  02/12/2014    Family History  Problem Relation Age of Onset  . Hypertension Mother   . Diabetes Mother   . Cancer - Other Mother   . Cancer - Other Father   . Heart attack Father     Social History:  reports that she has been smoking Cigarettes and E-cigarettes.  She has a 41 pack-year smoking history. She has never used smokeless tobacco. She reports that she drinks alcohol. She reports that she does not use illicit drugs.  Review of Systems -   She has history of high triglycerides. She is supposed to be taking her fenofibrate and not on any medications currently Walls taking Lipitor and fish oil.    Lab Results  Component Value Date   CHOL 99 04/04/2014   HDL 33.40* 04/04/2014   LDLCALC 3 04/04/2014   LDLDIRECT 81.9 12/13/2013   TRIG 314.0* 04/04/2014   CHOLHDL 3 04/04/2014    Hypothermia: She has not had this again. Not clear of the etiology. Her thyroid levels were normal and she was told to stop her supplements as she did not have documented hypothyroidism  Hypertension:  patient was on ramipril 5 mg and spironolactone with good control  HYPOKALEMIA: Her potassium had been improved ramipril but on her last visit she still had a low level Complaining about size of potassium tablets and she tends to choke on them  Painful neuropathy: Was given Lidoderm patches for relief   Examination:   BP 132/92  Pulse 100  Temp(Src) 97.9 F (36.6 C)  Resp 16  Ht $R'5\' 4"'Bf$  (1.626 m)  Wt 182 lb 6.4 oz (82.736 kg)  BMI 31.29 kg/m2  SpO2 99%  Body mass index is 31.29 kg/(m^2).   No pedal edema  Assesment/Plan:   1. Diabetes type 2, uncontrolled   The patient's diabetes is totally out of control  now because of noncompliance with her insulin completely as well as not wanting diet and drinking  large amounts of drinks with  sugar such as lemonade See history of present illness for problems identified A1c has been persistently over 9% and probably higher now Discussed with patient that she has been insulin requiring for quite some time and will not respond to oral agents because of lack of endogenous insulin secretion Also told her that with continued drinking regular drinks she will escalate her hyperglycemia and potentially lead to hospitalization She does need  to check her blood sugars occasionally at least  Recommendations made today:  Start Lantus with 45 units and may need to increase until morning sugar less than 150  Check some readings after meals in addition to fasting  Reduce lemonade and drink more water or low carbohydrate drinks   Start taking NovoLog flex pen a meal.control and also for high readings; given written instructions  Balanced meals with low fat and some protein  Continue Invokana  To consider using V go pump again  Restart potassium supplement with elixir and check levels today  She needs to call her psychiatrist/psychologist right away for help with her depression  Restart ramipril and other medications   Patient Instructions  Check your sugars at least every other day, some on waking up and some after meals  LANTUS insulin: Start today, injecting 46 units once daily at the same time everyday If the morning sugar is still over 200 increase the dose to 50 units  NOVOLOG insulin: Take 12 units at every meal. If the blood sugar is high at take extra doses at any time as follows: Glucose over 200 = 2 more units Glucose over 300 = extra 5 units Glucose over 400 = extra 10 units  Call your psychologist for treatment of depression right away May take liquid potassium     Counseling time over 50% of today's 25 minute visit  [name] 07/09/2014, 3:11 PM   Addendum: Potassium 2.8, patient needs to double potassium supplement and start spironolactone as before,  recheck CMP in 1 week

## 2014-07-10 NOTE — Telephone Encounter (Signed)
Thank you :)

## 2014-07-10 NOTE — Telephone Encounter (Signed)
Let her know, needs repeat labs too

## 2014-07-16 ENCOUNTER — Encounter: Payer: Self-pay | Admitting: *Deleted

## 2014-07-17 ENCOUNTER — Telehealth: Payer: Self-pay | Admitting: Endocrinology

## 2014-07-17 NOTE — Telephone Encounter (Signed)
Patient is returning your call.  

## 2014-07-18 ENCOUNTER — Telehealth: Payer: Self-pay | Admitting: *Deleted

## 2014-07-18 NOTE — Telephone Encounter (Signed)
Patient calling to speak with rhonda regarding invokana

## 2014-07-18 NOTE — Telephone Encounter (Signed)
She can alternate the V.-go pump and insulin but needs to check her sugar consistently She needs to continue Invokana as it is safe

## 2014-07-18 NOTE — Telephone Encounter (Signed)
Patient called, she said she was unable to take the liquid form of potassium so she's back to taking the tablets.  She also says she's heard on T.V how Invokana causes all kinds of problems and she's not sure she wants to take it, I assured her it was safe and it's normal to hear that on T.V when a new drug is introduced, I told her I would let you know of her concerns.  Also she wants to know if she can alternate using her V-Go pump and taking injections.  Please advise.

## 2014-07-21 ENCOUNTER — Other Ambulatory Visit: Payer: Self-pay | Admitting: Endocrinology

## 2014-07-22 NOTE — Telephone Encounter (Signed)
Noted patient is aware 

## 2014-07-26 ENCOUNTER — Other Ambulatory Visit: Payer: Self-pay | Admitting: Cardiology

## 2014-08-01 ENCOUNTER — Other Ambulatory Visit: Payer: Self-pay | Admitting: Endocrinology

## 2014-08-06 ENCOUNTER — Ambulatory Visit: Payer: Medicare Other | Admitting: Endocrinology

## 2014-08-13 ENCOUNTER — Ambulatory Visit (INDEPENDENT_AMBULATORY_CARE_PROVIDER_SITE_OTHER): Payer: Medicare Other | Admitting: Endocrinology

## 2014-08-13 ENCOUNTER — Encounter: Payer: Self-pay | Admitting: Endocrinology

## 2014-08-13 VITALS — BP 112/82 | HR 94 | Temp 97.9°F | Resp 16 | Ht 64.0 in | Wt 184.0 lb

## 2014-08-13 DIAGNOSIS — E876 Hypokalemia: Secondary | ICD-10-CM

## 2014-08-13 DIAGNOSIS — R5381 Other malaise: Secondary | ICD-10-CM

## 2014-08-13 DIAGNOSIS — I10 Essential (primary) hypertension: Secondary | ICD-10-CM

## 2014-08-13 DIAGNOSIS — R5383 Other fatigue: Secondary | ICD-10-CM

## 2014-08-13 DIAGNOSIS — IMO0001 Reserved for inherently not codable concepts without codable children: Secondary | ICD-10-CM

## 2014-08-13 DIAGNOSIS — E1165 Type 2 diabetes mellitus with hyperglycemia: Principal | ICD-10-CM

## 2014-08-13 DIAGNOSIS — I6350 Cerebral infarction due to unspecified occlusion or stenosis of unspecified cerebral artery: Secondary | ICD-10-CM

## 2014-08-13 LAB — COMPREHENSIVE METABOLIC PANEL
ALT: 19 U/L (ref 0–35)
AST: 17 U/L (ref 0–37)
Albumin: 3.6 g/dL (ref 3.5–5.2)
Alkaline Phosphatase: 63 U/L (ref 39–117)
BUN: 8 mg/dL (ref 6–23)
CALCIUM: 9.2 mg/dL (ref 8.4–10.5)
CHLORIDE: 101 meq/L (ref 96–112)
CO2: 25 meq/L (ref 19–32)
Creatinine, Ser: 0.9 mg/dL (ref 0.4–1.2)
GFR: 74.47 mL/min (ref >60.00)
Glucose, Bld: 157 mg/dL — ABNORMAL HIGH (ref 70–99)
Potassium: 4.2 mEq/L (ref 3.5–5.1)
Sodium: 134 mEq/L — ABNORMAL LOW (ref 135–145)
Total Bilirubin: 0.3 mg/dL (ref 0.2–1.2)
Total Protein: 6.7 g/dL (ref 6.0–8.3)

## 2014-08-13 LAB — GLUCOSE, POCT (MANUAL RESULT ENTRY): POC GLUCOSE: 188 mg/dL — AB (ref 70–99)

## 2014-08-13 NOTE — Progress Notes (Signed)
Patient ID: [name], female   DOB: 02/12/1966, 48 y.o.   MRN: [id number]   Reason for Appointment: Diabetes follow-up   History of Present Illness:   Diagnosis: Type 2 diabetes mellitus, date of diagnosis: 2004.  PAST history: She has been treated mostly with insulin since about a year after diagnosis. She has had difficulty with consistent compliance with diet and also compliance with self care including glucose monitoring over the years. She had been mostly treated with basal insulin. Also had been tried on mealtime insulin but she would be noncompliant with this. Was tried on Prandin for mealtime control but difficult to judge efficacy because of lack of postprandial monitoring. Was given Victoza to start in 2011 but did not follow up after this.She was tried on premixed insulin but this did not help her control, mostly because of noncompliance with the doses. She had been using the V- go pump and had better control initially and was better compliant with the daily routine and boluses. She has had frequent education visits also. She stopped using her V.-go pump in 6/15 because of discomfort at the site of application   RECENT history:   She still did not restart the pump even though she was advised to try it at least every other day She is only taking Lantus insulin even though she has NovoLog at home Previously was taking boluses with the pump of up to 12 units for meals She has been eating more consistently and sometimes has 2 sandwiches a day; again has not done any bolus for these She continues to drink drinks with sugar and is drinking at least 2 L of lemonade a day She does not check her blood sugar partly because of discomfort on the fingertips and reportedly cannot get blood from forearm sites Today she had no difficulty getting a blood sample in the office for fingerstick Not clear what her blood sugars are recently, A1c was markedly increased in July She does not appear to be  benefiting as much from Invokana but was taking 100 mg; not taking this recently except for today INSULIN dose: Lantus 46units qd on waking up  Glucometer:  FreeStyle.  Blood Glucose readings:  None Hypoglycemia frequency: Never.    Food preferences: eating 1 or 2 meals per day, sandwiches etc.  Physical activity: exercise: Minimal.  Certified Diabetes Educator visit: Most recent:, 2/14.  The diet that the patient has been following is no specific diet; still eating at irregular times; still drinking drinks with sugar claiming she is allergic to artificial sweeteners   Wt Readings from Last 3 Encounters:  08/13/14 184 lb (83.462 kg)  07/09/14 182 lb 6.4 oz (82.736 kg)  07/04/14 180 lb (81.647 kg)   DM labs:  Lab Results  Component Value Date   HGBA1C 11.3* 07/09/2014   HGBA1C 9.5* 04/04/2014   HGBA1C 9.1* 01/24/2014   Lab Results  Component Value Date   MICROALBUR 1.0 04/04/2014   LDLCALC 3 04/04/2014   CREATININE 0.9 08/13/2014    Complications: are: peripheral neuropathy.   Labs:  Office Visit on 08/13/2014  Component Date Value Ref Range Status  . POC Glucose 08/13/2014 188* 70 - 99 mg/dl Final  . Sodium 08/13/2014 134* 135 - 145 mEq/L Final  . Potassium 08/13/2014 4.2  3.5 - 5.1 mEq/L Final  . Chloride 08/13/2014 101  96 - 112 mEq/L Final  . CO2 08/13/2014 25  19 - 32 mEq/L Final  . Glucose, Bld 08/13/2014 157* 70 -   99 mg/dL Final  . BUN 08/13/2014 8  6 - 23 mg/dL Final  . Creatinine, Ser 08/13/2014 0.9  0.4 - 1.2 mg/dL Final  . Total Bilirubin 08/13/2014 0.3  0.2 - 1.2 mg/dL Final  . Alkaline Phosphatase 08/13/2014 63  39 - 117 U/L Final  . AST 08/13/2014 17  0 - 37 U/L Final  . ALT 08/13/2014 19  0 - 35 U/L Final  . Total Protein 08/13/2014 6.7  6.0 - 8.3 g/dL Final  . Albumin 08/13/2014 3.6  3.5 - 5.2 g/dL Final  . Calcium 08/13/2014 9.2  8.4 - 10.5 mg/dL Final  . GFR 08/13/2014 74.47  >60.00 mL/min Final  . Fructosamine 08/13/2014 283  0 - 285 umol/L Final    Comment: Published reference interval for apparently healthy subjects                          between age 72 and 7 is 70 - 285 umol/L and in a poorly                          controlled diabetic population is 228 - 563 umol/L with a                          mean of 396 umol/L.      Medication List       This list is accurate as of: 08/13/14 11:59 PM.  Always use your most recent med list.               ALPRAZolam 1 MG tablet  Commonly known as:  XANAX  Take 1 tablet (1 mg total) by mouth at bedtime as needed for anxiety.     aspirin 325 MG tablet  Take 1 tablet (325 mg total) by mouth daily.     atorvastatin 40 MG tablet  Commonly known as:  LIPITOR  TAKE 1 TABLET (40 MG TOTAL) BY MOUTH DAILY FOR CHOLESTEROL.     Canagliflozin 100 MG Tabs  Commonly known as:  INVOKANA  Take 1 tablet (100 mg total) by mouth daily.     INVOKANA 100 MG Tabs  Generic drug:  Canagliflozin  TAKE 1 TABLET (100 MG TOTAL) BY MOUTH DAILY.     chlorproMAZINE 100 MG tablet  Commonly known as:  THORAZINE  Take 100-200 mg by mouth at bedtime.     clonazePAM 2 MG tablet  Commonly known as:  KLONOPIN     diazepam 10 MG tablet  Commonly known as:  VALIUM  Take 10 mg by mouth 3 (three) times daily as needed for anxiety.     esomeprazole 40 MG capsule  Commonly known as:  NEXIUM  Take 40 mg by mouth 2 (two) times daily before a meal.     freestyle lancets  Test blood sugar 3 times daily as instructed. Dx code: 250.62     furosemide 20 MG tablet  Commonly known as:  LASIX  Take 1 tablet (20 mg total) by mouth as needed.     gabapentin 300 MG capsule  Commonly known as:  NEURONTIN     glucose blood test strip  Commonly known as:  FREESTYLE LITE  Use as instructed to check blood sugar 2 times daily dx code 250.02     insulin aspart 100 UNIT/ML FlexPen  Commonly known as:  NOVOLOG FLEXPEN  Inject 12 Units into the skin  3 (three) times daily with meals.     Insulin Glargine 100 UNIT/ML  Solostar Pen  Commonly known as:  LANTUS SOLOSTAR  Inject 46 units daily at the same time every day. If sugar goes above 200 increase dose to 50     Insulin Pen Needle 31G X 5 MM Misc  Use to inject insulin 4 times per day     KLOR-CON M20 20 MEQ tablet  Generic drug:  potassium chloride SA     Krill Oil 300 MG Caps  Take 3 capsules by mouth daily.     levothyroxine 25 MCG tablet  Commonly known as:  SYNTHROID, LEVOTHROID  TAKE 1 TABLET (25 MCG TOTAL) BY MOUTH DAILY BEFORE BREAKFAST.     lidocaine 5 % ointment  Commonly known as:  XYLOCAINE     magnesium oxide 400 MG tablet  Commonly known as:  MAG-OX  Take 400 mg by mouth daily.     oxyCODONE 5 MG immediate release tablet  Commonly known as:  Oxy IR/ROXICODONE     oxyCODONE-acetaminophen 10-325 MG per tablet  Commonly known as:  PERCOCET  Take 1 tablet by mouth 3 (three) times daily as needed for pain.     potassium chloride 2 mEq/mL Soln oral liquid  Commonly known as:  KCl  20 ml three times daily for 2 days, then once daily thereafter     promethazine 25 MG tablet  Commonly known as:  PHENERGAN  Take 25 mg by mouth every 6 (six) hours as needed for nausea or vomiting.     ramipril 5 MG capsule  Commonly known as:  ALTACE  Take 5 mg by mouth daily.     rizatriptan 10 MG tablet  Commonly known as:  MAXALT  Take 1 tablet (10 mg total) by mouth as needed for migraine. May repeat in 2 hours if needed     spironolactone 25 MG tablet  Commonly known as:  ALDACTONE  TAKE 1 TABLET (25 MG TOTAL) BY MOUTH DAILY.     tizanidine 2 MG capsule  Commonly known as:  ZANAFLEX  2 mg po qhs for one week then increase to 4mg po qhs     traMADol 50 MG tablet  Commonly known as:  ULTRAM  Take 2 tablets (100 mg total) by mouth every 6 (six) hours as needed.     V-GO 40 Kit  by Does not apply route.     Vitamin D (Ergocalciferol) 50000 UNITS Caps capsule  Commonly known as:  DRISDOL  Take 50,000 Units by mouth every 7  (seven) days. Take on Sunday        Allergies:  Allergies  Allergen Reactions  . Latex Itching, Rash and Other (See Comments)    Pt states she cannot use condoms - cause an infection.  Use of latex on skin is okay.  Tape causes rash  . Ibuprofen Other (See Comments)    headaches  . Sweetening Enhancer [Flavoring Agent] Nausea And Vomiting    And headaches  . Tetracyclines & Related Nausea And Vomiting    Yeast infections  . Trazodone And Nefazodone Other (See Comments)    Hallucinations   . Triazolam Other (See Comments)    hallucinations  . Aspartame And Phenylalanine Nausea And Vomiting    headaches    Past Medical History  Diagnosis Date  . Hypertension   . Hyperlipemia   . GERD (gastroesophageal reflux disease)   . Insomnia   . SUI (stress urinary incontinence, female)   S/P SLING 12-29-2011  . SOB (shortness of breath) on exertion   . Bipolar disorder   . Neurogenic bladder   . Hypercholesterolemia   . Gastroparesis   . Ulcer   . MRSA (methicillin resistant Staphylococcus aureus)   . Chronic pain   . Edema   . Chest pain     a. 2008 Cath: normal cors;  b. 12/2013 Lexi CL: EF 47%, no ischemia/infarct.  . Anxiety   . Vertigo   . COPD (chronic obstructive pulmonary disease)   . Cholecystitis   . Hypersomnia   . Tobacco abuse   . Claudication     a. 12/2013 ABI's: R 0.97, L 0.94.  . Palpitations   . Anginal pain   . Pneumonia     "twice, I think" (02/12/2014)  . Sleep apnea     didn't tolerate cpap (02/12/2014)  . Type II diabetes mellitus   . History of blood transfusion 1986    "related to lost a child" (02/12/2014)  . H/O hiatal hernia   . Headache(784.0)     "weekly" (02/12/2014)  . Stroke 02/12/2014    "eyesight is messed up; not steady on my feet" (02/12/2014)  . Arthritis     "qwhere" (02/12/2014)  . DJD (degenerative joint disease)   . Chronic back pain   . Depression   . Personality disorder   . OSA (obstructive sleep apnea) 07/04/2014    Past  Surgical History  Procedure Laterality Date  . Knee arthroscopy w/ allograft impant Left     graft x 2  . Foot surgery Bilateral   . Anterior cervical decomp/discectomy fusion  2000    C6 - 7  . Pubovaginal sling  12/29/2011    Procedure: PUBO-VAGINAL SLING;  Surgeon: Scott A MacDiarmid, MD;  Location: [facility];  Service: Urology;  Laterality: N/A;  cysto and sparc sling   . Lumbar fusion      "cage in my spine"  . Total abdominal hysterectomy w/ bilateral salpingoophorectomy  1997  . Multiple laparoscopies for endometriosis    . Cesarean section  1989; 1992  . Repeat reconstruction acl left knee/ screws removed  03-28-2000    CADAVER GRAFT  . Reconsturction of congenital uterus anomaly  1983  . Knee surgery      TOTAL 8 SURG'S  . Cystoscopy with injection  05/04/2012    Procedure: CYSTOSCOPY WITH INJECTION;  Surgeon: Scott A MacDiarmid, MD;  Location: [facility];  Service: Urology;  Laterality: N/A;  MACROPLASTIQUE INJECTION  . Cystoscopy with injection  08/28/2012    Procedure: CYSTOSCOPY WITH INJECTION;  Surgeon: Scott A MacDiarmid, MD;  Location: [facility];  Service: Urology;  Laterality: N/A;  cysto and macroplastique   . Carpal tunnel release Right 04-25-2013  . Cysto N/A 04/30/2013    Procedure: CYSTOSCOPY;  Surgeon: Scott A MacDiarmid, MD;  Location: [facility];  Service: Urology;  Laterality: N/A;  . Pubovaginal sling N/A 04/30/2013    Procedure: REMOVAL OF VAGINAL MESH;  Surgeon: Scott A MacDiarmid, MD;  Location: [facility];  Service: Urology;  Laterality: N/A;  . Abdominal hysterectomy    . Hernia repair  ?1996    "stomach"  . Cardiac catheterization  05-22-2008   DR SKAINS    NO SIGNIFECANT CAD/ NORMAL LV/  EF 65%/  NO WALL MOTION ABNORMALITIES  . Cardiac catheterization  02/12/2014    Family History  Problem Relation Age of Onset  . Hypertension Mother   . Diabetes   Mother   . Cancer - Other Mother   . Cancer - Other Father   .  Heart attack Father     Social History:  reports that she has been smoking Cigarettes and E-cigarettes.  She has a 41 pack-year smoking history. She has never used smokeless tobacco. She reports that she drinks alcohol. She reports that she does not use illicit drugs.  Review of Systems -   She has history of high triglycerides. She was prescribed fenofibrate but is not on any medications currently Also was taking Lipitor and fish oil.    Lab Results  Component Value Date   CHOL 99 04/04/2014   HDL 33.40* 04/04/2014   LDLCALC 3 04/04/2014   LDLDIRECT 81.9 12/13/2013   TRIG 314.0* 04/04/2014   CHOLHDL 3 04/04/2014    Thyroid: She has not had any documented hypothyroidism with either high TSH or low free T4 but for some reason is still taking 25 mcg daily  Hypertension:  patient is on ramipril 5 mg and spironolactone with good control  HYPOKALEMIA: Etiology of this is not clear. May have potassium wasting or hypomagnesemia related to diabetes being uncontrolled  Her potassium had been improved with ramipril but on her last visit she still had a very low level She is taking the tablets now along with magnesium. Previously had complained about the size of the tablets and did not tolerate the liquid  Painful neuropathy: Was given Lidoderm patches for pain control   Examination:   BP 112/82  Pulse 94  Temp(Src) 97.9 F (36.6 C)  Resp 16  Ht 5' 4" (1.626 m)  Wt 184 lb (83.462 kg)  BMI 31.57 kg/m2  SpO2 97%  Body mass index is 31.57 kg/(m^2).   No pedal edema  Assesment/Plan:   1. Diabetes type 2, uncontrolled   The patient's diabetes is again out of control  because of noncompliance with her mealtime insulin  as well as  Continuing to  consuming large amounts of drinks with sugar such as lemonade See history of present illness for problems identified and current management  She did not understand the need for doing mealtime coverage and bolus insulin and is only taking Lantus  even though she had taken boluses on the V.-go pump. Explained the need to take NovoLog with every meal and especially if eating significant amount of carbohydrate  A1c has been persistently over 9% and as high as 11%   She does need to check her blood sugars occasionally at least and agrees to start doing this since she had no difficulty getting blood from a fingerstick in the office today   Recommendations made today:   Continue Lantus and  may need to increase until morning sugar less than 150  Check  blood sugar readings after meals in addition to fasting and bring monitor for download on each visit  Reduce lemonade and drink more water or low carbohydrate drinks   Start taking NovoLog flex pen  with every  Meal as discussed above,  given written instructions  Balanced meals with low fat and  carbohydrate   Continue Invokana  To consider using V go pump again at least every other day   Check potassium level again  Stop levothyroxine as she does not have thyroid disease   Check A1c and lipids in 2 months  Consider followup for diabetes educator again if needed   Patient Instructions  Stop Furosemide and levothyroxine  Reduce Lipitor/atorvastatin to 1/2  Reduce sugary drinks  With each meal take 8 units of Novolog, keep one pen with you, take right when eating  Please check blood sugars at least half the time about 2 hours after any meal and 3 times per week on waking up. Please bring blood sugar monitor to each visit       Counseling time over 50% of today's 25 minute visit  Anastasia Tompson 08/14/2014, 9:21 AM   Addendum: Potassium 4.2 and we can have her take potassium 3 times a week now  Office Visit on 08/13/2014  Component Date Value Ref Range Status  . POC Glucose 08/13/2014 188* 70 - 99 mg/dl Final  . Sodium 08/13/2014 134* 135 - 145 mEq/L Final  . Potassium 08/13/2014 4.2  3.5 - 5.1 mEq/L Final  . Chloride 08/13/2014 101  96 - 112 mEq/L Final  . CO2  08/13/2014 25  19 - 32 mEq/L Final  . Glucose, Bld 08/13/2014 157* 70 - 99 mg/dL Final  . BUN 08/13/2014 8  6 - 23 mg/dL Final  . Creatinine, Ser 08/13/2014 0.9  0.4 - 1.2 mg/dL Final  . Total Bilirubin 08/13/2014 0.3  0.2 - 1.2 mg/dL Final  . Alkaline Phosphatase 08/13/2014 63  39 - 117 U/L Final  . AST 08/13/2014 17  0 - 37 U/L Final  . ALT 08/13/2014 19  0 - 35 U/L Final  . Total Protein 08/13/2014 6.7  6.0 - 8.3 g/dL Final  . Albumin 08/13/2014 3.6  3.5 - 5.2 g/dL Final  . Calcium 08/13/2014 9.2  8.4 - 10.5 mg/dL Final  . GFR 08/13/2014 74.47  >60.00 mL/min Final  . Fructosamine 08/13/2014 283  0 - 285 umol/L Final   Comment: Published reference interval for apparently healthy subjects                          between age 38 and 87 is 38 - 285 umol/L and in a poorly                          controlled diabetic population is 228 - 563 umol/L with a                          mean of 396 umol/L.

## 2014-08-13 NOTE — Patient Instructions (Addendum)
Stop Furosemide and levothyroxine  Reduce Lipitor/atorvastatin to 1/2  Reduce sugary drinks  With each meal take 8 units of Novolog, keep one pen with you, take right when eating  Please check blood sugars at least half the time about 2 hours after any meal and 3 times per week on waking up. Please bring blood sugar monitor to each visit

## 2014-08-14 LAB — FRUCTOSAMINE: Fructosamine: 283 umol/L (ref 0–285)

## 2014-08-14 NOTE — Progress Notes (Signed)
Quick Note:  Please let patient know that the potassium result is normal and can reduce potassium supplement to 3 times a week ______

## 2014-09-14 ENCOUNTER — Other Ambulatory Visit: Payer: Self-pay | Admitting: Cardiology

## 2014-09-15 ENCOUNTER — Other Ambulatory Visit: Payer: Self-pay | Admitting: Cardiology

## 2014-09-19 ENCOUNTER — Other Ambulatory Visit: Payer: Medicare Other

## 2014-09-24 ENCOUNTER — Other Ambulatory Visit: Payer: Self-pay | Admitting: Endocrinology

## 2014-09-24 ENCOUNTER — Ambulatory Visit (INDEPENDENT_AMBULATORY_CARE_PROVIDER_SITE_OTHER): Payer: Medicare Other | Admitting: Endocrinology

## 2014-09-24 ENCOUNTER — Other Ambulatory Visit: Payer: Self-pay | Admitting: *Deleted

## 2014-09-24 ENCOUNTER — Encounter: Payer: Self-pay | Admitting: Endocrinology

## 2014-09-24 ENCOUNTER — Other Ambulatory Visit: Payer: Medicare Other

## 2014-09-24 VITALS — BP 100/70 | HR 104 | Temp 97.7°F | Resp 16 | Ht 64.0 in | Wt 181.2 lb

## 2014-09-24 DIAGNOSIS — E1165 Type 2 diabetes mellitus with hyperglycemia: Principal | ICD-10-CM

## 2014-09-24 DIAGNOSIS — I6350 Cerebral infarction due to unspecified occlusion or stenosis of unspecified cerebral artery: Secondary | ICD-10-CM

## 2014-09-24 DIAGNOSIS — E1142 Type 2 diabetes mellitus with diabetic polyneuropathy: Secondary | ICD-10-CM

## 2014-09-24 DIAGNOSIS — IMO0001 Reserved for inherently not codable concepts without codable children: Secondary | ICD-10-CM

## 2014-09-24 DIAGNOSIS — E876 Hypokalemia: Secondary | ICD-10-CM

## 2014-09-24 DIAGNOSIS — R031 Nonspecific low blood-pressure reading: Secondary | ICD-10-CM

## 2014-09-24 LAB — COMPREHENSIVE METABOLIC PANEL
ALT: 8 U/L (ref 0–35)
AST: 11 U/L (ref 0–37)
Albumin: 3.9 g/dL (ref 3.5–5.2)
Alkaline Phosphatase: 59 U/L (ref 39–117)
BILIRUBIN TOTAL: 0.3 mg/dL (ref 0.2–1.2)
BUN: 6 mg/dL (ref 6–23)
CO2: 26 mEq/L (ref 19–32)
CREATININE: 0.74 mg/dL (ref 0.50–1.10)
Calcium: 9 mg/dL (ref 8.4–10.5)
Chloride: 103 mEq/L (ref 96–112)
Glucose, Bld: 128 mg/dL — ABNORMAL HIGH (ref 70–99)
Potassium: 4 mEq/L (ref 3.5–5.3)
SODIUM: 142 meq/L (ref 135–145)
Total Protein: 6.4 g/dL (ref 6.0–8.3)

## 2014-09-24 LAB — T4, FREE: Free T4: 1.24 ng/dL (ref 0.80–1.80)

## 2014-09-24 LAB — LIPID PANEL
CHOL/HDL RATIO: 2.7 ratio
CHOLESTEROL: 105 mg/dL (ref 0–200)
HDL: 39 mg/dL — ABNORMAL LOW (ref >39)
LDL CALC: 11 mg/dL (ref 0–99)
Triglycerides: 275 mg/dL — ABNORMAL HIGH (ref <150)
VLDL: 55 mg/dL — AB (ref 0–40)

## 2014-09-24 LAB — HEMOGLOBIN A1C
HEMOGLOBIN A1C: 7.4 % — AB (ref <5.7)
Mean Plasma Glucose: 166 mg/dL — ABNORMAL HIGH (ref <117)

## 2014-09-24 LAB — TSH: TSH: 2.361 u[IU]/mL (ref 0.350–4.500)

## 2014-09-24 LAB — GLUCOSE, POCT (MANUAL RESULT ENTRY): POC GLUCOSE: 171 mg/dL — AB (ref 70–99)

## 2014-09-24 MED ORDER — CANAGLIFLOZIN 100 MG PO TABS: 100.0000 mg | ORAL_TABLET | Freq: Every day | ORAL | Status: DC

## 2014-09-24 NOTE — Progress Notes (Signed)
Patient ID: [name], female   DOB: February 20, 1966, 48 y.o.   MRN: [id number]   Reason for Appointment: Diabetes follow-up   History of Present Illness:   Diagnosis: Type 2 diabetes mellitus, date of diagnosis: 2004.  PAST history: She has been treated mostly with insulin since about a year after diagnosis. She has had difficulty with consistent compliance with diet and also compliance with self care including glucose monitoring over the years. She had been mostly treated with basal insulin. Also had been tried on mealtime insulin but she would be noncompliant with this. Was tried on Prandin for mealtime control but difficult to judge efficacy because of lack of postprandial monitoring. Was given Victoza to start in 2011 but did not follow up after this.She was tried on premixed insulin but this did not help her control, mostly because of noncompliance with the doses. She had been using the V- go pump and had better control initially and was better compliant with the daily routine and boluses. She has had frequent education visits also.   RECENT history:   She stopped using her V.-go pump in 6/15 because of discomfort at the site of application She still did not restart the pump even though she was advised to try it at least every other day She is again only taking Lantus insulin even though she was told to start NovoLog and given written instructions on how much Previously was needing mealtime  boluses with the pump of up to 12 units for meals Also she has checked her blood sugar only in the morning and claims that they are fairly good. Does not usually bring her monitor for confirmation and has not checked any readings this week She has been eating more consistently and sometimes has 2 sandwiches a day; again has not done any bolus for these She continues to drink drinks with sugar including juice and Mountain Dew A1c was markedly increased in July at 11.3 and needs followup  Not clear if she  is benefiting as much from [place] and is  taking 100 mg Need to have some idea of her postprandial readings; today has not had a meal but had a soft drink about 2 hours ago  INSULIN dose: Lantus 46 units qd on waking up  Glucometer:  FreeStyle.  Blood Glucose readings:  98, 117 fasting Hypoglycemia frequency: Never.    Food preferences: eating 1 or 2 meals per day, sandwiches etc.  Physical activity: exercise: Minimal.  Certified Diabetes Educator visit: Most recent:, 2/14.  The diet that the patient has been following is no specific diet; still eating at irregular times; still drinking drinks with sugar claiming she is allergic to artificial sweeteners   Wt Readings from Last 3 Encounters:  09/24/14 181 lb 3.2 oz (82.192 kg)  08/13/14 184 lb (83.462 kg)  07/09/14 182 lb 6.4 oz (82.736 kg)   DM labs:  Lab Results  Component Value Date   HGBA1C 11.3* 07/09/2014   HGBA1C 9.5* 04/04/2014   HGBA1C 9.1* 01/24/2014   Lab Results  Component Value Date   MICROALBUR 1.0 04/04/2014   LDLCALC 3 04/04/2014   CREATININE 0.9 0/86/5784    Complications: are: peripheral neuropathy.   Labs:  Office Visit on 09/24/2014  Component Date Value Ref Range Status  . POC Glucose 09/24/2014 171* 70 - 99 mg/dl Final      Medication List       This list is accurate as of: 09/24/14  3:10 PM.  Always use  your most recent med list.               ALPRAZolam 1 MG tablet  Commonly known as:  XANAX  Take 1 tablet (1 mg total) by mouth at bedtime as needed for anxiety.     amphetamine-dextroamphetamine 30 MG tablet  Commonly known as:  ADDERALL     aspirin 325 MG tablet  Take 1 tablet (325 mg total) by mouth daily.     atorvastatin 40 MG tablet  Commonly known as:  LIPITOR  TAKE 1 TABLET (40 MG TOTAL) BY MOUTH DAILY FOR CHOLESTEROL.     Canagliflozin 100 MG Tabs  Commonly known as:  INVOKANA  Take 1 tablet (100 mg total) by mouth daily.     chlorproMAZINE 100 MG tablet  Commonly  known as:  THORAZINE  Take 100-200 mg by mouth at bedtime.     clonazePAM 2 MG tablet  Commonly known as:  KLONOPIN     diazepam 10 MG tablet  Commonly known as:  VALIUM  Take 10 mg by mouth 3 (three) times daily as needed for anxiety.     esomeprazole 40 MG capsule  Commonly known as:  NEXIUM  Take 40 mg by mouth 2 (two) times daily before a meal.     freestyle lancets  Test blood sugar 3 times daily as instructed. Dx code: 250.62     furosemide 20 MG tablet  Commonly known as:  LASIX  TAKE 1 TABLET (20 MG TOTAL) BY MOUTH AS NEEDED.     gabapentin 300 MG capsule  Commonly known as:  NEURONTIN     glucose blood test strip  Commonly known as:  FREESTYLE LITE  Use as instructed to check blood sugar 2 times daily dx code 250.02     insulin aspart 100 UNIT/ML FlexPen  Commonly known as:  NOVOLOG FLEXPEN  Inject 12 Units into the skin 3 (three) times daily with meals.     Insulin Glargine 100 UNIT/ML Solostar Pen  Commonly known as:  LANTUS SOLOSTAR  Inject 46 units daily at the same time every day. If sugar goes above 200 increase dose to 50     Insulin Pen Needle 31G X 5 MM Misc  Use to inject insulin 4 times per day     Krill Oil 300 MG Caps  Take 3 capsules by mouth daily.     lidocaine 5 % ointment  Commonly known as:  XYLOCAINE     magnesium oxide 400 MG tablet  Commonly known as:  MAG-OX  Take 400 mg by mouth daily.     oxyCODONE 5 MG immediate release tablet  Commonly known as:  Oxy IR/ROXICODONE     oxyCODONE-acetaminophen 10-325 MG per tablet  Commonly known as:  PERCOCET  Take 1 tablet by mouth 3 (three) times daily as needed for pain.     promethazine 25 MG tablet  Commonly known as:  PHENERGAN  Take 25 mg by mouth every 6 (six) hours as needed for nausea or vomiting.     ramipril 5 MG capsule  Commonly known as:  ALTACE  Take 5 mg by mouth daily.     RAPAFLO 8 MG Caps capsule  Generic drug:  silodosin     rizatriptan 10 MG tablet  Commonly  known as:  MAXALT  Take 1 tablet (10 mg total) by mouth as needed for migraine. May repeat in 2 hours if needed     spironolactone 25 MG tablet  Commonly known  as:  ALDACTONE  TAKE 1 TABLET (25 MG TOTAL) BY MOUTH DAILY.     tizanidine 2 MG capsule  Commonly known as:  ZANAFLEX  2 mg po qhs for one week then increase to 59m po qhs     V-GO 40 Kit  by Does not apply route.     ZOSTAVAX 153299UNT/0.65ML injection  Generic drug:  zoster vaccine live (PF)        Allergies:  Allergies  Allergen Reactions  . Latex Itching, Rash and Other (See Comments)    Pt states she cannot use condoms - cause an infection.  Use of latex on skin is okay.  Tape causes rash  . Ibuprofen Other (See Comments)    headaches  . Sweetening Enhancer [Flavoring Agent] Nausea And Vomiting    And headaches  . Tetracyclines & Related Nausea And Vomiting    Yeast infections  . Trazodone And Nefazodone Other (See Comments)    Hallucinations   . Triazolam Other (See Comments)    hallucinations  . Aspartame And Phenylalanine Nausea And Vomiting    headaches    Past Medical History  Diagnosis Date  . Hypertension   . Hyperlipemia   . GERD (gastroesophageal reflux disease)   . Insomnia   . SUI (stress urinary incontinence, female) S/P SLING 12-29-2011  . SOB (shortness of breath) on exertion   . Bipolar disorder   . Neurogenic bladder   . Hypercholesterolemia   . Gastroparesis   . Ulcer   . MRSA (methicillin resistant Staphylococcus aureus)   . Chronic pain   . Edema   . Chest pain     a. 2008 Cath: normal cors;  b. 12/2013 Lexi CL: EF 47%, no ischemia/infarct.  . Anxiety   . Vertigo   . COPD (chronic obstructive pulmonary disease)   . Cholecystitis   . Hypersomnia   . Tobacco abuse   . Claudication     a. 12/2013 ABI's: R 0.97, L 0.94.  .Marland KitchenPalpitations   . Anginal pain   . Pneumonia     "twice, I think" (02/12/2014)  . Sleep apnea     didn't tolerate cpap (02/12/2014)  . Type II diabetes  mellitus   . History of blood transfusion 1986    "related to lost a child" (02/12/2014)  . H/O hiatal hernia   . HMEQASTMH(962.2     "weekly" (02/12/2014)  . Stroke 02/12/2014    "eyesight is messed up; not steady on my feet" (02/12/2014)  . Arthritis     "qwhere" (02/12/2014)  . DJD (degenerative joint disease)   . Chronic back pain   . Depression   . Personality disorder   . OSA (obstructive sleep apnea) 07/04/2014    Past Surgical History  Procedure Laterality Date  . Knee arthroscopy w/ allograft impant Left     graft x 2  . Foot surgery Bilateral   . Anterior cervical decomp/discectomy fusion  2000    C6 - 7  . Pubovaginal sling  12/29/2011    Procedure: PGaynelle Arabian  Surgeon: SReece Packer MD;  [facility]  Service: Urology;  Laterality: N/A;  cysto and sparc sling   . Lumbar fusion      "cage in my spine"  . Total abdominal hysterectomy w/ bilateral salpingoophorectomy  1997  . Multiple laparoscopies for endometriosis    . Cesarean section  1989; 1992  . Repeat reconstruction acl left knee/ screws removed  03-28-2000    CADAVER  GRAFT  . 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 Knee surgery      TOTAL 8 SURG'S  . Cystoscopy with injection  05/04/2012    Procedure: CYSTOSCOPY WITH INJECTION;  Surgeon: Reece Packer, MD;  Location: [facility];  Service: Urology;  Laterality: N/A;  MACROPLASTIQUE INJECTION  . Cystoscopy with injection  08/28/2012    Procedure: CYSTOSCOPY WITH INJECTION;  Surgeon: Reece Packer, MD;  Location: [facility];  Service: Urology;  Laterality: N/A;  cysto and macroplastique   . Carpal tunnel release Right 04-25-2013  . Cysto N/A 04/30/2013    Procedure: CYSTOSCOPY;  Surgeon: Reece Packer, MD;  Location: [facility];  Service: Urology;  Laterality: N/A;  . Pubovaginal sling N/A 04/30/2013    Procedure: REMOVAL OF VAGINAL MESH;  Surgeon: Reece Packer, MD;   Location: [facility];  Service: Urology;  Laterality: N/A;  . Abdominal hysterectomy    . Hernia repair  ?1996    "stomach"  . Cardiac catheterization  05-22-2008   DR SKAINS    NO SIGNIFECANT CAD/ NORMAL LV/  EF 65%/  NO WALL MOTION ABNORMALITIES  . Cardiac catheterization  02/12/2014    Family History  Problem Relation Age of Onset  . Hypertension Mother   . Diabetes Mother   . Cancer - Other Mother   . Cancer - Other Father   . Heart attack Father     Social History:  reports that she has been smoking Cigarettes and E-cigarettes.  She has a 41 pack-year smoking history. She has never used smokeless tobacco. She reports that she drinks alcohol. She reports that she does not use illicit drugs.  Review of Systems -   She has history of high triglycerides. She was prescribed fenofibrate but is not on any medications currently Also was taking Lipitor and fish oil previously.    Lab Results  Component Value Date   CHOL 99 04/04/2014   HDL 33.40* 04/04/2014   LDLCALC 3 04/04/2014   LDLDIRECT 81.9 12/13/2013   TRIG 314.0* 04/04/2014   CHOLHDL 3 04/04/2014    Thyroid: She has not had any documented hypothyroidism with either high TSH or low free T4  She has stopped the low dose levothyroxine as instructed on her last visit  Hypertension:  patient is on ramipril 5 mg and spironolactone with good control  HYPOKALEMIA: Etiology of this is not clear. May have potassium wasting or hypomagnesemia related to diabetes being uncontrolled  Her potassium had been improved with ramipril but since it was low on her last visit she was given Aldactone 25 mg Followup level was improved and she is taking the potassium 3 times a week along with magnesium  Painful neuropathy: She has not brought this up for discussion recently   Examination:   BP 100/70  Pulse 104  Temp(Src) 97.7 F (36.5 C)  Resp 16  Ht '5\' 4"'  (1.626 m)  Wt 181 lb 3.2 oz (82.192 kg)  BMI 31.09 kg/m2  SpO2 97%  Body mass index is  31.09 kg/(m^2).   Blood pressure similar on both sides No ankle edema  Assesment/Plan:   1. Diabetes type 2, uncontrolled   The patient's diabetes is difficult to assess but probably not controlled because of noncompliance with her mealtime insulin despite reminders  Continuing to  consuming large amounts of drinks with sugar However glucose today is not unusually high even though she had a regular soft drink this morning See history  of present illness for problems identified and current management  Reminded her that since she was needing bolus insulin with her pump she will need to take NovoLog before her main meal at least  Discussed blood sugar targets, adjustment of dose of NovoLog Also discussed adjusting Lantus based on fasting blood sugar trend She does need to reduce frequency of her regular soft drinks Encouraged her to start walking more regularly  She does need to check her blood sugars occasionally after meals also and bring her monitor for download  2. Hypertension: Blood pressure is unusually low today and will stop her ramipril for now She will continue her Aldactone She thinks she cannot stop her Lasix because of tendency to edema   Patient Instructions  Novolog 3-5 units with meals and keep sugar after meals <180  If sugar in am is < 90 then reduce Lantus by 2-3 units  Please check blood sugars at least half the time about 2 hours after any meal and times per week on waking up.  Please bring blood sugar monitor to each visit  Stop Ramipril    Counseling time over 50% of today's 25 minute visit  Elyssa Pendelton 09/24/2014, 3:10 PM     Office Visit on 09/24/2014  Component Date Value Ref Range Status  . POC Glucose 09/24/2014 171* 70 - 99 mg/dl Final

## 2014-09-24 NOTE — Patient Instructions (Addendum)
Novolog 3-5 units with meals and keep sugar after meals <180  If sugar in am is < 90 then reduce Lantus by 2-3 units  Please check blood sugars at least half the time about 2 hours after any meal and times per week on waking up.  Please bring blood sugar monitor to each visit  Stop Ramipril

## 2014-09-25 LAB — URINALYSIS, MICROSCOPIC ONLY
Casts: NONE SEEN
Crystals: NONE SEEN

## 2014-09-25 LAB — URINALYSIS, ROUTINE W REFLEX MICROSCOPIC
Bilirubin Urine: NEGATIVE
HGB URINE DIPSTICK: NEGATIVE
KETONES UR: NEGATIVE mg/dL
Leukocytes, UA: NEGATIVE
Nitrite: NEGATIVE
PROTEIN: NEGATIVE mg/dL
Specific Gravity, Urine: 1.006 (ref 1.005–1.030)
UROBILINOGEN UA: 0.2 mg/dL (ref 0.0–1.0)
pH: 6.5 (ref 5.0–8.0)

## 2014-09-25 LAB — MICROALBUMIN / CREATININE URINE RATIO
CREATININE, URINE: 18.3 mg/dL
MICROALB/CREAT RATIO: 16.4 mg/g (ref 0.0–30.0)
Microalb, Ur: 0.3 mg/dL (ref <2.0)

## 2014-10-20 ENCOUNTER — Other Ambulatory Visit: Payer: Self-pay | Admitting: Endocrinology

## 2014-11-05 ENCOUNTER — Encounter: Payer: Self-pay | Admitting: Endocrinology

## 2014-11-05 ENCOUNTER — Ambulatory Visit (INDEPENDENT_AMBULATORY_CARE_PROVIDER_SITE_OTHER): Payer: Medicare Other | Admitting: Endocrinology

## 2014-11-05 VITALS — BP 124/84 | HR 100 | Temp 98.3°F | Resp 16 | Ht 64.0 in | Wt 184.0 lb

## 2014-11-05 DIAGNOSIS — R5383 Other fatigue: Secondary | ICD-10-CM

## 2014-11-05 DIAGNOSIS — R5381 Other malaise: Secondary | ICD-10-CM

## 2014-11-05 DIAGNOSIS — E1165 Type 2 diabetes mellitus with hyperglycemia: Secondary | ICD-10-CM

## 2014-11-05 DIAGNOSIS — E876 Hypokalemia: Secondary | ICD-10-CM

## 2014-11-05 DIAGNOSIS — N3000 Acute cystitis without hematuria: Secondary | ICD-10-CM

## 2014-11-05 DIAGNOSIS — I639 Cerebral infarction, unspecified: Secondary | ICD-10-CM

## 2014-11-05 DIAGNOSIS — IMO0002 Reserved for concepts with insufficient information to code with codable children: Secondary | ICD-10-CM

## 2014-11-05 LAB — URINALYSIS, ROUTINE W REFLEX MICROSCOPIC
Bilirubin Urine: NEGATIVE
KETONES UR: NEGATIVE
Nitrite: POSITIVE — AB
PH: 6.5 (ref 5.0–8.0)
TOTAL PROTEIN, URINE-UPE24: NEGATIVE
Urine Glucose: >=1000 — AB
Urobilinogen, UA: 0.2 (ref 0.0–1.0)

## 2014-11-05 LAB — BASIC METABOLIC PANEL
BUN: 5 mg/dL — ABNORMAL LOW (ref 6–23)
CHLORIDE: 103 meq/L (ref 96–112)
CO2: 25 mEq/L (ref 19–32)
Calcium: 9 mg/dL (ref 8.4–10.5)
Creatinine, Ser: 0.8 mg/dL (ref 0.4–1.2)
GFR: 79.73 mL/min (ref >60.00)
Glucose, Bld: 196 mg/dL — ABNORMAL HIGH (ref 70–99)
POTASSIUM: 3.1 meq/L — AB (ref 3.5–5.1)
SODIUM: 138 meq/L (ref 135–145)

## 2014-11-05 LAB — MICROALBUMIN / CREATININE URINE RATIO
Creatinine,U: 15.5 mg/dL
Microalb Creat Ratio: 6.5 mg/g (ref 0.0–30.0)
Microalb, Ur: 1 mg/dL (ref 0.0–1.9)

## 2014-11-05 LAB — COMPREHENSIVE METABOLIC PANEL
ALT: 8 U/L (ref 0–35)
AST: 12 U/L (ref 0–37)
Albumin: 3.3 g/dL — ABNORMAL LOW (ref 3.5–5.2)
Alkaline Phosphatase: 60 U/L (ref 39–117)
BUN: 5 mg/dL — ABNORMAL LOW (ref 6–23)
CO2: 25 mEq/L (ref 19–32)
Calcium: 9 mg/dL (ref 8.4–10.5)
Chloride: 103 mEq/L (ref 96–112)
Creatinine, Ser: 0.8 mg/dL (ref 0.4–1.2)
GFR: 79.73 mL/min (ref >60.00)
Glucose, Bld: 196 mg/dL — ABNORMAL HIGH (ref 70–99)
POTASSIUM: 3.1 meq/L — AB (ref 3.5–5.1)
Sodium: 138 mEq/L (ref 135–145)
Total Bilirubin: 0.1 mg/dL — ABNORMAL LOW (ref 0.2–1.2)
Total Protein: 6.7 g/dL (ref 6.0–8.3)

## 2014-11-05 LAB — HEMOGLOBIN A1C: HEMOGLOBIN A1C: 6.5 % (ref 4.6–6.5)

## 2014-11-05 LAB — LIPID PANEL
CHOL/HDL RATIO: 3
Cholesterol: 105 mg/dL (ref 0–200)
HDL: 31.1 mg/dL — ABNORMAL LOW (ref >39.00)
LDL Cholesterol: 40 mg/dL (ref 0–99)
NonHDL: 73.9
TRIGLYCERIDES: 168 mg/dL — AB (ref 0.0–149.0)
VLDL: 33.6 mg/dL (ref 0.0–40.0)

## 2014-11-05 LAB — T4, FREE: Free T4: 0.84 ng/dL (ref 0.60–1.60)

## 2014-11-05 LAB — TSH: TSH: 0.96 u[IU]/mL (ref 0.35–4.50)

## 2014-11-05 NOTE — Progress Notes (Signed)
Quick Note:  Potassium is 3.1, low. Needs to stop furosemide and double the dose of Aldactone. Restart potassium chloride tablets 10 mEq daily in follow-up in 2 weeks with labs Cholesterol is relatively low, can reduce her Lipitor to half tablet ______

## 2014-11-05 NOTE — Progress Notes (Signed)
Patient ID: [name], female   DOB: 1966/07/13, 48 y.o.   MRN: [id number]   Reason for Appointment: Diabetes follow-up    History of Present Illness:   Diagnosis: Type 2 diabetes mellitus, date of diagnosis: 2004.  PAST history: She has been treated mostly with insulin since about a year after diagnosis. She has had difficulty with consistent compliance with diet and also compliance with self care including glucose monitoring over the years. She had been mostly treated with basal insulin. Also had been tried on mealtime insulin but she would be noncompliant with this. Was tried on Prandin for mealtime control but difficult to judge efficacy because of lack of postprandial monitoring. Was given Victoza to start in 2011 but did not follow up after this.She was tried on premixed insulin but this did not help her control, mostly because of noncompliance with the doses. She had been using the V- go pump and had better control initially and was better compliant with the daily routine and boluses. She has had frequent education visits also. She stopped using her V.-go pump in 6/15 because of discomfort at the site of application   RECENT history:   She is again only taking Lantus insulin even though she was told to start NovoLog before meals and given written instructions on doing this Previously was needing mealtime  boluses with the pump of up to 12 units for meals Also she has checked her blood sugar very rarely and did not bring her monitor again Although her A1c was relatively better in 9/15 her blood sugars are likely to be higher now and will need labs done today  She continues to drink drinks with sugar including gatorade and [facility], usually consuming about a liter or more of regular soft drinks Not clear if she is benefiting as much from [place] and is  taking 100 mg She is asking about this causing UTI although has had only one episode treated with unknown antibiotic by PCP about  2 weeks ago She has done only one glucose reading right after eating which was over 200  INSULIN dose: Lantus 46 units qd    Glucometer:  FreeStyle.  Blood Glucose readings:  unknown Hypoglycemia frequency: Never.    Food preferences: eating 1 or 2 meals per day, sandwiches etc.  Physical activity: exercise: Minimal.  Certified Diabetes Educator visit: Most recent:, 2/14.  The diet that the patient has been following is no specific diet; still eating at irregular times; still drinking drinks with sugar claiming she is allergic to artificial sweeteners   Wt Readings from Last 3 Encounters:  11/05/14 184 lb (83.462 kg)  09/24/14 181 lb 3.2 oz (82.192 kg)  08/13/14 184 lb (83.462 kg)   DM labs:  Lab Results  Component Value Date   HGBA1C 6.5 11/05/2014   HGBA1C 7.4* 09/24/2014   HGBA1C 11.3* 07/09/2014   Lab Results  Component Value Date   MICROALBUR 1.0 11/05/2014   LDLCALC 40 11/05/2014   CREATININE 0.8 11/05/2014   CREATININE 0.8 62/94/7654    Complications: are: peripheral neuropathy and she is still fairly symptomatic with this.   Labs:  Office Visit on 11/05/2014  Component Date Value Ref Range Status  . Hgb A1c MFr Bld 11/05/2014 6.5  4.6 - 6.5 % Final   Glycemic Control Guidelines for People with Diabetes:Non Diabetic:  <6%Goal of Therapy: <7%Additional Action Suggested:  >8%   . Cholesterol 11/05/2014 105  0 - 200 mg/dL Final   ATP III  Classification       Desirable:  < 200 mg/dL               Borderline High:  200 - 239 mg/dL          High:  > = 240 mg/dL  . Triglycerides 11/05/2014 168.0* 0.0 - 149.0 mg/dL Final   Normal:  <150 mg/dLBorderline High:  150 - 199 mg/dL  . HDL 11/05/2014 31.10* >39.00 mg/dL Final  . VLDL 11/05/2014 33.6  0.0 - 40.0 mg/dL Final  . LDL Cholesterol 11/05/2014 40  0 - 99 mg/dL Final  . Total CHOL/HDL Ratio 11/05/2014 3   Final                  Men          Women1/2 Average Risk     3.4          3.3Average Risk          5.0           4.42X Average Risk          9.6          7.13X Average Risk          15.0          11.0                      . NonHDL 11/05/2014 73.90   Final   NOTE:  Non-HDL goal should be 30 mg/dL higher than patient's LDL goal (i.e. LDL goal of < 70 mg/dL, would have non-HDL goal of < 100 mg/dL)  . Sodium 11/05/2014 138  135 - 145 mEq/L Final  . Potassium 11/05/2014 3.1* 3.5 - 5.1 mEq/L Final  . Chloride 11/05/2014 103  96 - 112 mEq/L Final  . CO2 11/05/2014 25  19 - 32 mEq/L Final  . Glucose, Bld 11/05/2014 196* 70 - 99 mg/dL Final  . BUN 11/05/2014 5* 6 - 23 mg/dL Final  . Creatinine, Ser 11/05/2014 0.8  0.4 - 1.2 mg/dL Final  . Total Bilirubin 11/05/2014 0.1* 0.2 - 1.2 mg/dL Final  . Alkaline Phosphatase 11/05/2014 60  39 - 117 U/L Final  . AST 11/05/2014 12  0 - 37 U/L Final  . ALT 11/05/2014 8  0 - 35 U/L Final  . Total Protein 11/05/2014 6.7  6.0 - 8.3 g/dL Final  . Albumin 11/05/2014 3.3* 3.5 - 5.2 g/dL Final  . Calcium 11/05/2014 9.0  8.4 - 10.5 mg/dL Final  . GFR 11/05/2014 79.73  >60.00 mL/min Final  . Microalb, Ur 11/05/2014 1.0  0.0 - 1.9 mg/dL Final  . Creatinine,U 11/05/2014 15.5   Final  . Microalb Creat Ratio 11/05/2014 6.5  0.0 - 30.0 mg/g Final  . Color, Urine 11/05/2014 YELLOW  Yellow;Lt. Yellow Final  . APPearance 11/05/2014 CLEAR  Clear Final  . Specific Gravity, Urine 11/05/2014 <=1.005* 1.000 - 1.030 Final  . pH 11/05/2014 6.5  5.0 - 8.0 Final  . Total Protein, Urine 11/05/2014 NEGATIVE  Negative Final  . Urine Glucose 11/05/2014 >=1000* Negative Final  . Ketones, ur 11/05/2014 NEGATIVE  Negative Final  . Bilirubin Urine 11/05/2014 NEGATIVE  Negative Final  . Hgb urine dipstick 11/05/2014 TRACE-LYSED* Negative Final  . Urobilinogen, UA 11/05/2014 0.2  0.0 - 1.0 Final  . Leukocytes, UA 11/05/2014 MODERATE* Negative Final  . Nitrite 11/05/2014 POSITIVE* Negative Final  . WBC, UA 11/05/2014 11-20/hpf* 0-2/hpf Final  . RBC / HPF 11/05/2014  0-2/hpf  0-2/hpf Final  . Squamous  Epithelial / LPF 11/05/2014 Rare(0-4/hpf)  Rare(0-4/hpf) Final  . Bacteria, UA 11/05/2014 Rare(<10/hpf)* None Final  . TSH 11/05/2014 0.96  0.35 - 4.50 uIU/mL Final  . Free T4 11/05/2014 0.84  0.60 - 1.60 ng/dL Final  . Sodium 11/05/2014 138  135 - 145 mEq/L Final  . Potassium 11/05/2014 3.1* 3.5 - 5.1 mEq/L Final  . Chloride 11/05/2014 103  96 - 112 mEq/L Final  . CO2 11/05/2014 25  19 - 32 mEq/L Final  . Glucose, Bld 11/05/2014 196* 70 - 99 mg/dL Final  . BUN 11/05/2014 5* 6 - 23 mg/dL Final  . Creatinine, Ser 11/05/2014 0.8  0.4 - 1.2 mg/dL Final  . Calcium 11/05/2014 9.0  8.4 - 10.5 mg/dL Final  . GFR 11/05/2014 79.73  >60.00 mL/min Final      Medication List       This list is accurate as of: 11/05/14  8:50 PM.  Always use your most recent med list.               ALPRAZolam 1 MG tablet  Commonly known as:  XANAX  Take 1 tablet (1 mg total) by mouth at bedtime as needed for anxiety.     amphetamine-dextroamphetamine 30 MG tablet  Commonly known as:  ADDERALL     aspirin 325 MG tablet  Take 1 tablet (325 mg total) by mouth daily.     atorvastatin 40 MG tablet  Commonly known as:  LIPITOR  TAKE 1 TABLET (40 MG TOTAL) BY MOUTH DAILY FOR CHOLESTEROL.     canagliflozin 100 MG Tabs tablet  Commonly known as:  INVOKANA  Take 1 tablet (100 mg total) by mouth daily.     chlorproMAZINE 100 MG tablet  Commonly known as:  THORAZINE  Take 100-200 mg by mouth at bedtime.     clonazePAM 2 MG tablet  Commonly known as:  KLONOPIN     diazepam 10 MG tablet  Commonly known as:  VALIUM  Take 10 mg by mouth 3 (three) times daily as needed for anxiety.     esomeprazole 40 MG capsule  Commonly known as:  NEXIUM  Take 40 mg by mouth 2 (two) times daily before a meal.     freestyle lancets  Test blood sugar 3 times daily as instructed. Dx code: 250.62     furosemide 20 MG tablet  Commonly known as:  LASIX  TAKE 1 TABLET (20 MG TOTAL) BY MOUTH AS NEEDED.     gabapentin 300  MG capsule  Commonly known as:  NEURONTIN     glucose blood test strip  Commonly known as:  FREESTYLE LITE  Use as instructed to check blood sugar 2 times daily dx code 250.02     insulin aspart 100 UNIT/ML FlexPen  Commonly known as:  NOVOLOG FLEXPEN  Inject 12 Units into the skin 3 (three) times daily with meals.     Insulin Glargine 100 UNIT/ML Solostar Pen  Commonly known as:  LANTUS SOLOSTAR  Inject 46 units daily at the same time every day. If sugar goes above 200 increase dose to 50     Insulin Pen Needle 31G X 5 MM Misc  Use to inject insulin 4 times per day     Krill Oil 300 MG Caps  Take 3 capsules by mouth daily.     lidocaine 5 % ointment  Commonly known as:  XYLOCAINE     magnesium oxide 400 MG tablet  Commonly known as:  MAG-OX  Take 400 mg by mouth daily.     oxyCODONE 5 MG immediate release tablet  Commonly known as:  Oxy IR/ROXICODONE     oxyCODONE-acetaminophen 10-325 MG per tablet  Commonly known as:  PERCOCET  Take 1 tablet by mouth 3 (three) times daily as needed for pain.     promethazine 25 MG tablet  Commonly known as:  PHENERGAN  Take 25 mg by mouth every 6 (six) hours as needed for nausea or vomiting.     ramipril 5 MG capsule  Commonly known as:  ALTACE  Take 5 mg by mouth daily.     RAPAFLO 8 MG Caps capsule  Generic drug:  silodosin     rizatriptan 10 MG tablet  Commonly known as:  MAXALT  Take 1 tablet (10 mg total) by mouth as needed for migraine. May repeat in 2 hours if needed     spironolactone 25 MG tablet  Commonly known as:  ALDACTONE  TAKE 1 TABLET BY MOUTH EVERY DAY     tizanidine 2 MG capsule  Commonly known as:  ZANAFLEX  2 mg po qhs for one week then increase to 83m po qhs     V-GO 40 Kit  by Does not apply route.     ZOSTAVAX 100938UNT/0.65ML injection  Generic drug:  zoster vaccine live (PF)        Allergies:  Allergies  Allergen Reactions  . Latex Itching, Rash and Other (See Comments)    Pt states she  cannot use condoms - cause an infection.  Use of latex on skin is okay.  Tape causes rash  . Ibuprofen Other (See Comments)    headaches  . Sweetening Enhancer [Flavoring Agent] Nausea And Vomiting    And headaches  . Tetracyclines & Related Nausea And Vomiting    Yeast infections  . Trazodone And Nefazodone Other (See Comments)    Hallucinations   . Triazolam Other (See Comments)    hallucinations  . Aspartame And Phenylalanine Nausea And Vomiting    headaches    Past Medical History  Diagnosis Date  . Hypertension   . Hyperlipemia   . GERD (gastroesophageal reflux disease)   . Insomnia   . SUI (stress urinary incontinence, female) S/P SLING 12-29-2011  . SOB (shortness of breath) on exertion   . Bipolar disorder   . Neurogenic bladder   . Hypercholesterolemia   . Gastroparesis   . Ulcer   . MRSA (methicillin resistant Staphylococcus aureus)   . Chronic pain   . Edema   . Chest pain     a. 2008 Cath: normal cors;  b. 12/2013 Lexi CL: EF 47%, no ischemia/infarct.  . Anxiety   . Vertigo   . COPD (chronic obstructive pulmonary disease)   . Cholecystitis   . Hypersomnia   . Tobacco abuse   . Claudication     a. 12/2013 ABI's: R 0.97, L 0.94.  .Marland KitchenPalpitations   . Anginal pain   . Pneumonia     "twice, I think" (02/12/2014)  . Sleep apnea     didn't tolerate cpap (02/12/2014)  . Type II diabetes mellitus   . History of blood transfusion 1986    "related to lost a child" (02/12/2014)  . H/O hiatal hernia   . HHWEXHBZJ(696.7     "weekly" (02/12/2014)  . Stroke 02/12/2014    "eyesight is messed up; not steady on my feet" (02/12/2014)  . Arthritis     "  qwhere" (02/12/2014)  . DJD (degenerative joint disease)   . Chronic back pain   . Depression   . Personality disorder   . OSA (obstructive sleep apnea) 07/04/2014    Past Surgical History  Procedure Laterality Date  . Knee arthroscopy w/ allograft impant Left     graft x 2  . Foot surgery Bilateral   . Anterior  cervical decomp/discectomy fusion  2000    C6 - 7  . Pubovaginal sling  12/29/2011    Procedure: Gaynelle Arabian;  Surgeon: Reece Packer, MD;  [facility];  Service: Urology;  Laterality: N/A;  cysto and sparc sling   . Lumbar fusion      "cage in my spine"  . Total abdominal hysterectomy w/ bilateral salpingoophorectomy  1997  . Multiple laparoscopies for endometriosis    . Cesarean section  1989; 1992  . Repeat reconstruction acl left knee/ screws removed  03-28-2000    CADAVER GRAFT  . 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 Knee surgery      TOTAL 8 SURG'S  . Cystoscopy with injection  05/04/2012    Procedure: CYSTOSCOPY WITH INJECTION;  Surgeon: Reece Packer, MD;  [facility];  Service: Urology;  Laterality: N/A;  MACROPLASTIQUE INJECTION  . Cystoscopy with injection  08/28/2012    Procedure: CYSTOSCOPY WITH INJECTION;  Surgeon: Reece Packer, MD;  Location[facility];  Service: Urology;  Laterality: N/A;  cysto and macroplastique   . Carpal tunnel release Right 04-25-2013  . Cysto N/A 04/30/2013    Procedure: CYSTOSCOPY;  Surgeon: Reece Packer, MD;  Location: [facility];  Service: Urology;  Laterality: N/A;  . Pubovaginal sling N/A 04/30/2013    Procedure: REMOVAL OF VAGINAL MESH;  Surgeon: Reece Packer, MD;  Location: [facility];  Service: Urology;  Laterality: N/A;  . Abdominal hysterectomy    . Hernia repair  ?1996    "stomach"  . Cardiac catheterization  05-22-2008   DR SKAINS    NO SIGNIFECANT CAD/ NORMAL LV/  EF 65%/  NO WALL MOTION ABNORMALITIES  . Cardiac catheterization  02/12/2014    Family History  Problem Relation Age of Onset  . Hypertension Mother   . Diabetes Mother   . Cancer - Other Mother   . Cancer - Other Father   . Heart attack Father     Social History:  reports that she has been smoking Cigarettes and E-cigarettes.  She has a 41 pack-year smoking history.  She has never used smokeless tobacco. She reports that she drinks alcohol. She reports that she does not use illicit drugs.  Review of Systems -   She has had burning with urination and was given unknown antibiotic by PCP.  She thinks infection is not controlled and is asking about Invokana causing infections  She has history of high triglycerides. She was prescribed fenofibrate but is not taking this  she is taking Lipitor    Lab Results  Component Value Date   CHOL 105 11/05/2014   HDL 31.10* 11/05/2014   LDLCALC 40 11/05/2014   LDLDIRECT 81.9 12/13/2013   TRIG 168.0* 11/05/2014   CHOLHDL 3 11/05/2014    Thyroid: She has not had any documented hypothyroidism with either high TSH or low free T4  She has stopped the low dose levothyroxine given by PCP and follow-up thyroid levels were normal  Lab Results  Component Value Date   FREET4 0.84 11/05/2014  FREET4 1.24 09/24/2014   FREET4 0.90 07/09/2014   TSH 0.96 11/05/2014   TSH 2.361 09/24/2014   TSH 0.53 07/09/2014    Hypertension:  patient is on ramipril 5 mg and spironolactone with good control  HYPOKALEMIA: improved with taking Aldactone, needs follow-up level  Painful neuropathy: She has had more symptoms recently and has tried various drugs including gabapentin   Examination:   BP 124/84 mmHg  Pulse 100  Temp(Src) 98.3 F (36.8 C)  Resp 16  Ht _0  (1.626 m)  Wt 184 lb (83.462 kg)  BMI 31.57 kg/m2  SpO2 97%  Body mass index is 31.57 kg/(m^2).   No ankle edema  Assesment/Plan:   1. Diabetes type 2, uncontrolled   The patient's diabetes is difficult to assess but probably not controlled because of noncompliance with her mealtime insulin again Also not checking blood sugar at home much and still not motivated to take care of her diabetes Discussed that she is most likely getting some benefit of her postprandial readings with Invokana and would not like to stop this list she has recurrent UTI Continuing to    Drink large amounts of drinks with sugar and not clear if she is getting balanced meals See history of present illness for problems identified and current management  Reminded her that she will need to take NovoLog before her main meal at least  Discussed blood sugar targets, adjustment of dose of NovoLog, compliant with diet and increasing water instead of sugar drinks Will need to check fructosamine to evaluate overall level of control  2. Hypertension: Blood pressure is well controlled 3.  Hypokalemia: Needs to follow-upher level today  4. She needs to stop Lasix because of taking both Invokana and Aldactone and this also may cause hypokalemia  5.  Recent UTI.  Since she is symptomatic will check her urinalysis and culture today and treat accordingly   Patient Instructions  Please check blood sugars at least half the time about 2 hours after any meal and times per week on waking up. Please bring blood sugar monitor to each visit  MUST take [place]: 4-5 units each time  Avoid Furosemide unless swelling   Counseling time over 50% of today's 25 minute visit  Amber Williard 11/05/2014, 8:50 PM     Office Visit on 11/05/2014  Component Date Value Ref Range Status  . Hgb A1c MFr Bld 11/05/2014 6.5  4.6 - 6.5 % Final   Glycemic Control Guidelines for People with Diabetes:Non Diabetic:  <6%Goal of Therapy: <7%Additional Action Suggested:  >8%   . Cholesterol 11/05/2014 105  0 - 200 mg/dL Final   ATP III Classification       Desirable:  < 200 mg/dL               Borderline High:  200 - 239 mg/dL          High:  > = 240 mg/dL  . Triglycerides 11/05/2014 168.0* 0.0 - 149.0 mg/dL Final   Normal:  <150 mg/dLBorderline High:  150 - 199 mg/dL  . HDL 11/05/2014 31.10* >39.00 mg/dL Final  . VLDL 11/05/2014 33.6  0.0 - 40.0 mg/dL Final  . LDL Cholesterol 11/05/2014 40  0 - 99 mg/dL Final  . Total CHOL/HDL Ratio 11/05/2014 3   Final                  Men          Women1/2  Average Risk  3.4          3.3Average Risk          5.0          4.42X Average Risk          9.6          7.13X Average Risk          15.0          11.0                      . NonHDL 11/05/2014 73.90   Final   NOTE:  Non-HDL goal should be 30 mg/dL higher than patient's LDL goal (i.e. LDL goal of < 70 mg/dL, would have non-HDL goal of < 100 mg/dL)  . Sodium 11/05/2014 138  135 - 145 mEq/L Final  . Potassium 11/05/2014 3.1* 3.5 - 5.1 mEq/L Final  . Chloride 11/05/2014 103  96 - 112 mEq/L Final  . CO2 11/05/2014 25  19 - 32 mEq/L Final  . Glucose, Bld 11/05/2014 196* 70 - 99 mg/dL Final  . BUN 11/05/2014 5* 6 - 23 mg/dL Final  . Creatinine, Ser 11/05/2014 0.8  0.4 - 1.2 mg/dL Final  . Total Bilirubin 11/05/2014 0.1* 0.2 - 1.2 mg/dL Final  . Alkaline Phosphatase 11/05/2014 60  39 - 117 U/L Final  . AST 11/05/2014 12  0 - 37 U/L Final  . ALT 11/05/2014 8  0 - 35 U/L Final  . Total Protein 11/05/2014 6.7  6.0 - 8.3 g/dL Final  . Albumin 11/05/2014 3.3* 3.5 - 5.2 g/dL Final  . Calcium 11/05/2014 9.0  8.4 - 10.5 mg/dL Final  . GFR 11/05/2014 79.73  >60.00 mL/min Final  . Microalb, Ur 11/05/2014 1.0  0.0 - 1.9 mg/dL Final  . Creatinine,U 11/05/2014 15.5   Final  . Microalb Creat Ratio 11/05/2014 6.5  0.0 - 30.0 mg/g Final  . Color, Urine 11/05/2014 YELLOW  Yellow;Lt. Yellow Final  . APPearance 11/05/2014 CLEAR  Clear Final  . Specific Gravity, Urine 11/05/2014 <=1.005* 1.000 - 1.030 Final  . pH 11/05/2014 6.5  5.0 - 8.0 Final  . Total Protein, Urine 11/05/2014 NEGATIVE  Negative Final  . Urine Glucose 11/05/2014 >=1000* Negative Final  . Ketones, ur 11/05/2014 NEGATIVE  Negative Final  . Bilirubin Urine 11/05/2014 NEGATIVE  Negative Final  . Hgb urine dipstick 11/05/2014 TRACE-LYSED* Negative Final  . Urobilinogen, UA 11/05/2014 0.2  0.0 - 1.0 Final  . Leukocytes, UA 11/05/2014 MODERATE* Negative Final  . Nitrite 11/05/2014 POSITIVE* Negative Final  . WBC, UA 11/05/2014 11-20/hpf* 0-2/hpf  Final  . RBC / HPF 11/05/2014 0-2/hpf  0-2/hpf Final  . Squamous Epithelial / LPF 11/05/2014 Rare(0-4/hpf)  Rare(0-4/hpf) Final  . Bacteria, UA 11/05/2014 Rare(<10/hpf)* None Final  . TSH 11/05/2014 0.96  0.35 - 4.50 uIU/mL Final  . Free T4 11/05/2014 0.84  0.60 - 1.60 ng/dL Final  . Sodium 11/05/2014 138  135 - 145 mEq/L Final  . Potassium 11/05/2014 3.1* 3.5 - 5.1 mEq/L Final  . Chloride 11/05/2014 103  96 - 112 mEq/L Final  . CO2 11/05/2014 25  19 - 32 mEq/L Final  . Glucose, Bld 11/05/2014 196* 70 - 99 mg/dL Final  . BUN 11/05/2014 5* 6 - 23 mg/dL Final  . Creatinine, Ser 11/05/2014 0.8  0.4 - 1.2 mg/dL Final  . Calcium 11/05/2014 9.0  8.4 - 10.5 mg/dL Final  . GFR 11/05/2014 79.73  >60.00 mL/min Final

## 2014-11-05 NOTE — Patient Instructions (Addendum)
Please check blood sugars at least half the time about 2 hours after any meal and times per week on waking up. Please bring blood sugar monitor to each visit  MUST take [place]: 4-5 units each time  Avoid Furosemide unless swelling

## 2014-11-06 ENCOUNTER — Other Ambulatory Visit: Payer: Self-pay | Admitting: *Deleted

## 2014-11-06 ENCOUNTER — Telehealth: Payer: Self-pay | Admitting: Endocrinology

## 2014-11-06 MED ORDER — SPIRONOLACTONE 25 MG PO TABS: 25.0000 mg | ORAL_TABLET | Freq: Two times a day (BID) | ORAL | Status: DC

## 2014-11-06 NOTE — Telephone Encounter (Signed)
Pt does not have the aldactone

## 2014-11-07 LAB — FRUCTOSAMINE: FRUCTOSAMINE: 216 umol/L (ref 190–270)

## 2014-11-07 NOTE — Telephone Encounter (Signed)
rx sent on 11/12

## 2014-11-08 LAB — URINE CULTURE

## 2014-11-11 ENCOUNTER — Emergency Department (HOSPITAL_COMMUNITY)
Admission: EM | Admit: 2014-11-11 | Discharge: 2014-11-11 | Disposition: A | Discharge status: Home / Self Care | Payer: Medicare Other | Attending: Emergency Medicine | Admitting: Emergency Medicine

## 2014-11-11 ENCOUNTER — Other Ambulatory Visit: Payer: Self-pay | Admitting: *Deleted

## 2014-11-11 ENCOUNTER — Emergency Department (HOSPITAL_COMMUNITY): Payer: Medicare Other

## 2014-11-11 DIAGNOSIS — Z720 Tobacco use: Secondary | ICD-10-CM | POA: Insufficient documentation

## 2014-11-11 DIAGNOSIS — E876 Hypokalemia: Secondary | ICD-10-CM | POA: Diagnosis not present

## 2014-11-11 DIAGNOSIS — Z9889 Other specified postprocedural states: Secondary | ICD-10-CM | POA: Diagnosis not present

## 2014-11-11 DIAGNOSIS — K219 Gastro-esophageal reflux disease without esophagitis: Secondary | ICD-10-CM | POA: Insufficient documentation

## 2014-11-11 DIAGNOSIS — Z87448 Personal history of other diseases of urinary system: Secondary | ICD-10-CM | POA: Diagnosis not present

## 2014-11-11 DIAGNOSIS — R000 Tachycardia, unspecified: Secondary | ICD-10-CM | POA: Diagnosis not present

## 2014-11-11 DIAGNOSIS — M1990 Unspecified osteoarthritis, unspecified site: Secondary | ICD-10-CM | POA: Insufficient documentation

## 2014-11-11 DIAGNOSIS — Z7982 Long term (current) use of aspirin: Secondary | ICD-10-CM | POA: Diagnosis not present

## 2014-11-11 DIAGNOSIS — Z8701 Personal history of pneumonia (recurrent): Secondary | ICD-10-CM | POA: Diagnosis not present

## 2014-11-11 DIAGNOSIS — E780 Pure hypercholesterolemia: Secondary | ICD-10-CM | POA: Insufficient documentation

## 2014-11-11 DIAGNOSIS — Z8673 Personal history of transient ischemic attack (TIA), and cerebral infarction without residual deficits: Secondary | ICD-10-CM | POA: Insufficient documentation

## 2014-11-11 DIAGNOSIS — E119 Type 2 diabetes mellitus without complications: Secondary | ICD-10-CM | POA: Diagnosis not present

## 2014-11-11 DIAGNOSIS — I10 Essential (primary) hypertension: Secondary | ICD-10-CM | POA: Diagnosis not present

## 2014-11-11 DIAGNOSIS — G4733 Obstructive sleep apnea (adult) (pediatric): Secondary | ICD-10-CM | POA: Insufficient documentation

## 2014-11-11 DIAGNOSIS — F419 Anxiety disorder, unspecified: Secondary | ICD-10-CM | POA: Diagnosis not present

## 2014-11-11 DIAGNOSIS — G4700 Insomnia, unspecified: Secondary | ICD-10-CM | POA: Insufficient documentation

## 2014-11-11 DIAGNOSIS — G8929 Other chronic pain: Secondary | ICD-10-CM | POA: Diagnosis not present

## 2014-11-11 DIAGNOSIS — Z8614 Personal history of Methicillin resistant Staphylococcus aureus infection: Secondary | ICD-10-CM | POA: Diagnosis not present

## 2014-11-11 DIAGNOSIS — E785 Hyperlipidemia, unspecified: Secondary | ICD-10-CM | POA: Insufficient documentation

## 2014-11-11 DIAGNOSIS — N390 Urinary tract infection, site not specified: Secondary | ICD-10-CM | POA: Diagnosis not present

## 2014-11-11 DIAGNOSIS — R1030 Lower abdominal pain, unspecified: Secondary | ICD-10-CM

## 2014-11-11 DIAGNOSIS — F319 Bipolar disorder, unspecified: Secondary | ICD-10-CM | POA: Diagnosis not present

## 2014-11-11 DIAGNOSIS — Z91040 Latex allergy status: Secondary | ICD-10-CM | POA: Diagnosis not present

## 2014-11-11 DIAGNOSIS — K5900 Constipation, unspecified: Secondary | ICD-10-CM

## 2014-11-11 DIAGNOSIS — Z792 Long term (current) use of antibiotics: Secondary | ICD-10-CM | POA: Insufficient documentation

## 2014-11-11 DIAGNOSIS — J449 Chronic obstructive pulmonary disease, unspecified: Secondary | ICD-10-CM | POA: Insufficient documentation

## 2014-11-11 DIAGNOSIS — Z794 Long term (current) use of insulin: Secondary | ICD-10-CM | POA: Insufficient documentation

## 2014-11-11 DIAGNOSIS — Z90710 Acquired absence of both cervix and uterus: Secondary | ICD-10-CM | POA: Insufficient documentation

## 2014-11-11 DIAGNOSIS — Z79899 Other long term (current) drug therapy: Secondary | ICD-10-CM | POA: Diagnosis not present

## 2014-11-11 LAB — URINALYSIS, ROUTINE W REFLEX MICROSCOPIC
BILIRUBIN URINE: NEGATIVE
Ketones, ur: NEGATIVE mg/dL
NITRITE: POSITIVE — AB
PH: 6 (ref 5.0–8.0)
Protein, ur: NEGATIVE mg/dL
Urobilinogen, UA: 0.2 mg/dL (ref 0.0–1.0)

## 2014-11-11 LAB — URINE MICROSCOPIC-ADD ON

## 2014-11-11 LAB — I-STAT CHEM 8, ED
CALCIUM ION: 1.12 mmol/L (ref 1.12–1.23)
CHLORIDE: 99 meq/L (ref 96–112)
Creatinine, Ser: 0.8 mg/dL (ref 0.50–1.10)
Glucose, Bld: 184 mg/dL — ABNORMAL HIGH (ref 70–99)
HCT: 40 % (ref 36.0–46.0)
Hemoglobin: 13.6 g/dL (ref 12.0–15.0)
Potassium: 3.2 mEq/L — ABNORMAL LOW (ref 3.7–5.3)
SODIUM: 140 meq/L (ref 137–147)
TCO2: 25 mmol/L (ref 0–100)

## 2014-11-11 LAB — CBC
HEMATOCRIT: 38.4 % (ref 36.0–46.0)
Hemoglobin: 13.1 g/dL (ref 12.0–15.0)
MCH: 30 pg (ref 26.0–34.0)
MCHC: 34.1 g/dL (ref 30.0–36.0)
MCV: 87.9 fL (ref 78.0–100.0)
PLATELETS: 235 10*3/uL (ref 150–400)
RBC: 4.37 MIL/uL (ref 3.87–5.11)
RDW: 12.5 % (ref 11.5–15.5)
WBC: 10.1 10*3/uL (ref 4.0–10.5)

## 2014-11-11 LAB — POC OCCULT BLOOD, ED: Fecal Occult Bld: NEGATIVE

## 2014-11-11 MED ORDER — SODIUM CHLORIDE 0.9 % IV BOLUS (SEPSIS)
1000.0000 mL | Freq: Once | INTRAVENOUS | Status: AC
  Administered 2014-11-11: 1000 mL via INTRAVENOUS

## 2014-11-11 MED ORDER — CEPHALEXIN 250 MG PO CAPS: 250.0000 mg | ORAL_CAPSULE | Freq: Three times a day (TID) | ORAL | Status: DC

## 2014-11-11 MED ORDER — CEPHALEXIN 500 MG PO CAPS: ORAL_CAPSULE | ORAL | Status: DC

## 2014-11-11 MED ORDER — FLUCONAZOLE 150 MG PO TABS: 150.0000 mg | ORAL_TABLET | Freq: Once | ORAL | Status: DC

## 2014-11-11 NOTE — ED Provider Notes (Signed)
CSN: [id number]     Arrival date & time 11/11/14  1306 History   First MD Initiated Contact with Patient 11/11/14 1629     Chief Complaint  Patient presents with  . Constipation     (Consider location/radiation/quality/duration/timing/severity/associated sxs/prior Treatment) HPI Comments: [name] is a 48 y.o. female with a PMHx of HLD, GERD, HTN, stress incontinence s/p sling, bipolar disorder, neurogenic bladder, gastroparesis, chronic pain, anxiety, COPD, cholecysititis, palpitations, DM2, hiatal hernia s/p repair, CVA, DJD, chronic back pain, depression, and OSA, who presents to the ED with complaints of chronic constipation x3 weeks. She states this is a chronic and ongoing issue related to her chronic narcotic use, and she takes Dulcolax, Colace, and uses suppositories and enemas on a regular basis. Over the last 3 weeks these have not been successful. She called her primary care doctor yesterday who advised her to use half a bottle of Mira lax, but she states that she used the entire bottle instead. This did not produce a bowel movement, but instead caused some generalized achy abdominal pain which she did not have previously. She endorses that the pain is 4/10 constant nonradiating worse with sitting and without any alleviating factors. She has not tried anything specifically to help with this pain. She reports that she feels somewhat bloated, and that she last passed flatus one day ago. She denies any fevers, chills, chest pain, shortness of breath, nausea, vomiting, diarrhea, hematochezia, rectal pain, belching, obstipation, vaginal bleeding, vaginal discharge, suspicious food intake, recent travel, or sick contacts. She is currently taking Cipro for a UTI, stating that she had some dysuria and called her OB/GYN who prescribed her Cipro. She denies any hematuria or malodorous urine.  Patient is a 48 y.o. female presenting with constipation. The history is provided by the patient. No  language interpreter was used.  Constipation Severity:  Moderate Time since last bowel movement:  3 weeks Timing:  Constant Progression:  Unchanged Chronicity:  Chronic Context: narcotics   Stool description:  None produced Unusual stool frequency:  0 Relieved by:  Nothing Worsened by:  Narcotic pain medications Ineffective treatments:  Enemas, laxatives, Miralax and stool softeners Associated symptoms: abdominal pain, dysuria (on Cipro for UTI) and flatus (yesterday)   Associated symptoms: no back pain (chronic), no diarrhea, no fever, no hematochezia, no nausea, no urinary retention and no vomiting   Abdominal pain:    Location:  Generalized   Quality:  Aching   Severity:  Mild (4/10)   Onset quality:  Gradual   Duration:  1 day   Timing:  Constant   Progression:  Unchanged   Chronicity:  New Risk factors: recent antibiotic use   Risk factors: no recent surgery and no recent travel     Past Medical History  Diagnosis Date  . Hypertension   . Hyperlipemia   . GERD (gastroesophageal reflux disease)   . Insomnia   . SUI (stress urinary incontinence, female) S/P SLING 12-29-2011  . SOB (shortness of breath) on exertion   . Bipolar disorder   . Neurogenic bladder   . Hypercholesterolemia   . Gastroparesis   . Ulcer   . MRSA (methicillin resistant Staphylococcus aureus)   . Chronic pain   . Edema   . Chest pain     a. 2008 Cath: normal cors;  b. 12/2013 Lexi CL: EF 47%, no ischemia/infarct.  . Anxiety   . Vertigo   . COPD (chronic obstructive pulmonary disease)   . Cholecystitis   .  Hypersomnia   . Tobacco abuse   . Claudication     a. 12/2013 ABI's: R 0.97, L 0.94.  [name] Palpitations   . Anginal pain   . Pneumonia     "twice, I think" (02/12/2014)  . Sleep apnea     didn't tolerate cpap (02/12/2014)  . Type II diabetes mellitus   . History of blood transfusion 1986    "related to lost a child" (02/12/2014)  . H/O hiatal hernia   . WFUXNATF(573.2)     "weekly"  (02/12/2014)  . Stroke 02/12/2014    "eyesight is messed up; not steady on my feet" (02/12/2014)  . Arthritis     "qwhere" (02/12/2014)  . DJD (degenerative joint disease)   . Chronic back pain   . Depression   . Personality disorder   . OSA (obstructive sleep apnea) 07/04/2014   Past Surgical History  Procedure Laterality Date  . Knee arthroscopy w/ allograft impant Left     graft x 2  . Foot surgery Bilateral   . Anterior cervical decomp/discectomy fusion  2000    C6 - 7  . Pubovaginal sling  12/29/2011    Procedure: Gaynelle Arabian;  Surgeon: Reece Packer, MD;  Location: [facility];  Service: Urology;  Laterality: N/A;  cysto and sparc sling   . Lumbar fusion      "cage in my spine"  . Total abdominal hysterectomy w/ bilateral salpingoophorectomy  1997  . Multiple laparoscopies for endometriosis    . Cesarean section  1989; 1992  . Repeat reconstruction acl left knee/ screws removed  03-28-2000    CADAVER GRAFT  . 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 Knee surgery      TOTAL 8 SURG'S  . Cystoscopy with injection  05/04/2012    Procedure: CYSTOSCOPY WITH INJECTION;  Surgeon: Reece Packer, MD;  Location: [facility];  Service: Urology;  Laterality: N/A;  MACROPLASTIQUE INJECTION  . Cystoscopy with injection  08/28/2012    Procedure: CYSTOSCOPY WITH INJECTION;  Surgeon: Reece Packer, MD;  Location: [facility];  Service: Urology;  Laterality: N/A;  cysto and macroplastique   . Carpal tunnel release Right 04-25-2013  . Cysto N/A 04/30/2013    Procedure: CYSTOSCOPY;  Surgeon: Reece Packer, MD;  Location: [facility];  Service: Urology;  Laterality: N/A;  . Pubovaginal sling N/A 04/30/2013    Procedure: REMOVAL OF VAGINAL MESH;  Surgeon: Reece Packer, MD;  Location: [facility];  Service: Urology;  Laterality: N/A;  . Abdominal hysterectomy    . Hernia repair  ?1996    "stomach"  . Cardiac catheterization   05-22-2008   DR SKAINS    NO SIGNIFECANT CAD/ NORMAL LV/  EF 65%/  NO WALL MOTION ABNORMALITIES  . Cardiac catheterization  02/12/2014   Family History  Problem Relation Age of Onset  . Hypertension Mother   . Diabetes Mother   . Cancer - Other Mother   . Cancer - Other Father   . Heart attack Father    History  Substance Use Topics  . Smoking status: Current Every Day Smoker -- 1.00 packs/day for 41 years    Types: Cigarettes, E-cigarettes  . Smokeless tobacco: Never Used  . Alcohol Use: 0.0 oz/week     Comment: 02/12/2014 "might have a mixed drink on special occasions"   OB History    No data available     Review of Systems  Constitutional: Negative for  fever and chills.  Eyes: Negative for visual disturbance.  Respiratory: Negative for shortness of breath.   Cardiovascular: Negative for chest pain.  Gastrointestinal: Positive for abdominal pain, constipation, abdominal distention (feels bloated) and flatus (yesterday). Negative for nausea, vomiting, diarrhea, blood in stool, hematochezia, anal bleeding and rectal pain.  Genitourinary: Positive for dysuria (on Cipro for UTI). Negative for frequency, hematuria, flank pain, vaginal bleeding, vaginal discharge and difficulty urinating.  Musculoskeletal: Negative for myalgias, back pain (chronic) and arthralgias.  Skin: Negative for color change.  Neurological: Negative for dizziness, syncope, weakness, light-headedness, numbness and headaches.  Psychiatric/Behavioral: Negative for confusion.   10 Systems reviewed and are negative for acute change except as noted in the HPI.    Allergies  Latex; Ibuprofen; Sweetening enhancer; Tetracyclines & related; Trazodone and nefazodone; Triazolam; and Aspartame and phenylalanine  Home Medications   Prior to Admission medications   Medication Sig Start Date End Date Taking? Authorizing Provider  ALPRAZolam Duanne Moron) 1 MG tablet Take 1 tablet (1 mg total) by mouth at bedtime as needed for  anxiety. 07/04/14   Asencion Partridge Dohmeier, MD  amphetamine-dextroamphetamine (ADDERALL) 30 MG tablet  09/05/14   Historical Provider, MD  aspirin 325 MG tablet Take 1 tablet (325 mg total) by mouth daily. 02/14/14   Verlee Monte, MD  atorvastatin (LIPITOR) 40 MG tablet TAKE 1 TABLET (40 MG TOTAL) BY MOUTH DAILY FOR CHOLESTEROL.    Candee Furbish, MD  Canagliflozin [facility]) 100 MG TABS Take 1 tablet (100 mg total) by mouth daily. 09/24/14   Elayne Snare, MD  cephALEXin (KEFLEX) 250 MG capsule Take 1 capsule (250 mg total) by mouth 3 (three) times daily. 11/11/14   Elayne Snare, MD  chlorproMAZINE (THORAZINE) 100 MG tablet Take 100-200 mg by mouth at bedtime.    Historical Provider, MD  clonazePAM Bobbye Charleston) 2 MG tablet  06/04/14   Historical Provider, MD  diazepam (VALIUM) 10 MG tablet Take 10 mg by mouth 3 (three) times daily as needed for anxiety.    Historical Provider, MD  esomeprazole (NEXIUM) 40 MG capsule Take 40 mg by mouth 2 (two) times daily before a meal.    Historical Provider, MD  furosemide (LASIX) 20 MG tablet TAKE 1 TABLET (20 MG TOTAL) BY MOUTH AS NEEDED. 09/15/14   Candee Furbish, MD  gabapentin (NEURONTIN) 300 MG capsule  06/10/14   Historical Provider, MD  glucose blood (FREESTYLE LITE) test strip Use as instructed to check blood sugar 2 times daily dx code 250.02 04/07/14   Elayne Snare, MD  insulin aspart (NOVOLOG FLEXPEN) 100 UNIT/ML FlexPen Inject 12 Units into the skin 3 (three) times daily with meals. 07/09/14   Elayne Snare, MD  Insulin Disposable Pump (V-GO 40) KIT by Does not apply route.    Historical Provider, MD  Insulin Glargine (LANTUS SOLOSTAR) 100 UNIT/ML Solostar Pen Inject 46 units daily at the same time every day. If sugar goes above 200 increase dose to 50 07/09/14   Elayne Snare, MD  Insulin Pen Needle 31G X 5 MM MISC Use to inject insulin 4 times per day 07/09/14   Elayne Snare, MD  Javier Docker Oil 300 MG CAPS Take 3 capsules by mouth daily.    Historical Provider, MD  Lancets (FREESTYLE) lancets  Test blood sugar 3 times daily as instructed. Dx code: 250.62 04/25/14   Elayne Snare, MD  lidocaine (XYLOCAINE) 5 % ointment  05/06/14   Historical Provider, MD  magnesium oxide (MAG-OX) 400 MG tablet Take 400 mg by mouth daily.  Historical Provider, MD  oxyCODONE (OXY IR/ROXICODONE) 5 MG immediate release tablet  04/17/14   Historical Provider, MD  oxyCODONE-acetaminophen (PERCOCET) 10-325 MG per tablet Take 1 tablet by mouth 3 (three) times daily as needed for pain.    Historical Provider, MD  promethazine (PHENERGAN) 25 MG tablet Take 25 mg by mouth every 6 (six) hours as needed for nausea or vomiting.    Historical Provider, MD  ramipril (ALTACE) 5 MG capsule Take 5 mg by mouth daily. 02/04/14   Elayne Snare, MD  RAPAFLO 8 MG CAPS capsule  09/22/14   Historical Provider, MD  rizatriptan (MAXALT) 10 MG tablet Take 1 tablet (10 mg total) by mouth as needed for migraine. May repeat in 2 hours if needed 06/11/14   Antony Contras, MD  spironolactone (ALDACTONE) 25 MG tablet Take 1 tablet (25 mg total) by mouth 2 (two) times daily. 11/06/14   Elayne Snare, MD  tizanidine (ZANAFLEX) 2 MG capsule 2 mg po qhs for one week then increase to 41m po qhs 04/25/14   PAntony Contras MD  ZOSTAVAX 160109UNT/0.65ML injection  09/15/14   Historical Provider, MD   BP 112/78 mmHg  Pulse 110  Temp(Src) 97.5 F (36.4 C) (Oral)  Resp 18  SpO2 99% Physical Exam  Constitutional: She is oriented to person, place, and time. She appears well-developed and well-nourished.  Non-toxic appearance. No distress.  Mildly tachycardic consistent with prior vitals. Afebrile nontoxic, NAD, otherwise VSS  HENT:  Head: Normocephalic and atraumatic.  Mouth/Throat: Oropharynx is clear and moist and mucous membranes are normal.  Eyes: Conjunctivae and EOM are normal. Right eye exhibits no discharge. Left eye exhibits no discharge.  Neck: Normal range of motion. Neck supple.  Cardiovascular: Regular rhythm, normal heart sounds and intact distal  pulses.  Tachycardia present.  Exam reveals no gallop and no friction rub.   No murmur heard. Mildly tachycardic  Pulmonary/Chest: Effort normal and breath sounds normal. No respiratory distress. She has no decreased breath sounds. She has no wheezes. She has no rhonchi. She has no rales.  Abdominal: Soft. Normal appearance and bowel sounds are normal. She exhibits no distension. There is generalized tenderness. There is no rigidity, no rebound, no guarding, no CVA tenderness, no tenderness at McBurney's point and negative Murphy's sign.    Soft, ND, +BS throughout although somewhat hypoactive in LLQ, with generalized mild TTP mostly located on R abdomen and lower abdomen, no r/g/r, neg murphy's, neg mcburney's, no CVA TTP  Musculoskeletal: Normal range of motion.  Baseline strength and sensation, no new focal deficits  Neurological: She is alert and oriented to person, place, and time. She has normal strength. No sensory deficit.  Skin: Skin is warm, dry and intact. No rash noted.  Psychiatric: Her affect is blunt.  Somewhat blunted affect  Nursing note and vitals reviewed.   ED Course  Procedures (including critical care time) Labs Review Labs Reviewed  URINALYSIS, ROUTINE W REFLEX MICROSCOPIC - Abnormal; Notable for the following:    APPearance CLOUDY (*)    Specific Gravity, Urine <1.005 (*)    Glucose, UA >1000 (*)    Hgb urine dipstick TRACE (*)    Nitrite POSITIVE (*)    Leukocytes, UA MODERATE (*)    All other components within normal limits  URINE MICROSCOPIC-ADD ON - Abnormal; Notable for the following:    Squamous Epithelial / LPF MANY (*)    Bacteria, UA FEW (*)    All other components within normal limits  I-STAT  CHEM 8, ED - Abnormal; Notable for the following:    Potassium 3.2 (*)    BUN <3 (*)    Glucose, Bld 184 (*)    All other components within normal limits  URINE CULTURE  CBC  OCCULT BLOOD X 1 CARD TO LAB, STOOL  POC OCCULT BLOOD, ED    Imaging  Review Dg Abd Acute W/chest  11/11/2014   CLINICAL DATA:  Constipation, infraumbilical pain.  EXAM: ACUTE ABDOMEN SERIES (ABDOMEN 2 VIEW & CHEST 1 VIEW)  COMPARISON:  Multiple exams, including 02/12/2014  FINDINGS: Lower cervical plate and screw fixator.  The lungs appear clear.  Cardiac and mediastinal contours normal.  No pleural effusion identified.  No free intraperitoneal gas beneath the hemidiaphragms. Ray cages at L4-5.  Air- fluid levels in the proximal, transverse, and proximal descending colon. Mild formed stool in the rectum. There certainly no abundance of colonic stool. No dilated bowel.  IMPRESSION: 1. Air-fluid levels extend slightly further than normally due in the colon, which can be seen in setting of a diarrheal process. However, the patient states that she has not had a bowel movement in 3 weeks. We do not demonstrate a large amount of stool in the colon to corroborate with this degree of constipation. No dilated bowel.   Electronically Signed   By: Sherryl Barters M.D.   On: 11/11/2014 19:20     EKG Interpretation None      MDM   Final diagnoses:  Constipation  UTI (lower urinary tract infection)  Lower abdominal pain  Hypokalemia    48y/o female with chronic constipation related to chronic opiate use. Abdomen nonperitoneal, nondistended, minimal tenderness diffusely. Istat chem 8 with chronic hypokalemia 3.2, CBC unremarkable. FOBT neg, no stool in rectal vault. Will obtain acute abd series to eval for perf/obstruction. Will give fluids since pt was tachycardic. Likely home with instructions to use mag citrate tomorrow.  7:34 PM Tachycardia resolved after fluids. U/A showing +nitrites, +leuks, 21-50 WBC, few bacteria, many squamous. This could be consistent with UTI although it's contaminated. Will send for culture, and advise pt to continue taking keflex unless lab calls that bacteria is resistant to that abx. Chart review shows she was supposed to start this last week  but didn't pick it up. Will give Rx for it today. AAS showing air-fluid levels c/w diarrheal process, no large stool burden. No perf or obstruction. Appears this pt isn't constipated at all, seems more related to over use of laxatives in combination with UTI causing her pain. Will advise to increase fiber in diet and discontinue using miralax and dulcolax. Can continue colace in order to avoid future constipation episodes. Will have her f/up with PCP in 1wk. I explained the diagnosis and have given explicit precautions to return to the ER including for any other new or worsening symptoms. The patient understands and accepts the medical plan as it's been dictated and I have answered their questions. Discharge instructions concerning home care and prescriptions have been given. The patient is STABLE and is discharged to home in good condition.  BP 109/57 mmHg  Pulse 87  Temp(Src) 98 F (36.7 C) (Oral)  Resp 16  SpO2 95%  Meds ordered this encounter  Medications  . sodium chloride 0.9 % bolus 1,000 mL    Sig:   . cephALEXin (KEFLEX) 500 MG capsule    Sig: 2 caps po bid x 7 days    Dispense:  28 capsule    Refill:  0  Order Specific Question:  Supervising Provider    Answer:  Noemi Chapel D [address] [place], PA-C 11/11/14 [place], MD 11/12/14 [phone]

## 2014-11-11 NOTE — Discharge Instructions (Signed)
Stay very well hydrated with plenty of water throughout the day. Increase the fiber in your diet to help bulk up the stool. Continue using colace but stop taking miralax and dulcolax. Take antibiotic until completed. May use tylenol or motrin for pain. Follow up with primary care physician in 1 week for recheck of ongoing symptoms but return to ER for emergent changing or worsening of symptoms. Please seek immediate care if you develop the following: You develop back pain.  Your symptoms are no better, or worse in 3 days. There is severe back pain or lower abdominal pain.  You develop chills.  You have a fever.  There is nausea or vomiting.  There is continued burning or discomfort with urination.    Abdominal Pain Many things can cause belly (abdominal) pain. Most times, the belly pain is not dangerous. Many cases of belly pain can be watched and treated at home. HOME CARE   Do not take medicines that help you go poop (laxatives) unless told to by your doctor.  Only take medicine as told by your doctor.  Eat or drink as told by your doctor. Your doctor will tell you if you should be on a special diet. GET HELP IF:  You do not know what is causing your belly pain.  You have belly pain while you are sick to your stomach (nauseous) or have runny poop (diarrhea).  You have pain while you pee or poop.  Your belly pain wakes you up at night.  You have belly pain that gets worse or better when you eat.  You have belly pain that gets worse when you eat fatty foods.  You have a fever. GET HELP RIGHT AWAY IF:   The pain does not go away within 2 hours.  You keep throwing up (vomiting).  The pain changes and is only in the right or left part of the belly.  You have bloody or tarry looking poop. MAKE SURE YOU:   Understand these instructions.  Will watch your condition.  Will get help right away if you are not doing well or get worse. Document Released: 05/30/2008 Document  Revised: 12/17/2013 Document Reviewed: 08/21/2013 [facility] Patient Information 2015 [place], [place]. This information is not intended to replace advice given to you by your health care provider. Make sure you discuss any questions you have with your health care provider.  Constipation Constipation is when a person has fewer than three bowel movements a week, has difficulty having a bowel movement, or has stools that are dry, hard, or larger than normal. As people grow older, constipation is more common. If you try to fix constipation with medicines that make you have a bowel movement (laxatives), the problem may get worse. Long-term laxative use may cause the muscles of the colon to become weak. A low-fiber diet, not taking in enough fluids, and taking certain medicines may make constipation worse.  CAUSES   Certain medicines, such as antidepressants, pain medicine, iron supplements, antacids, and water pills.   Certain diseases, such as diabetes, irritable bowel syndrome (IBS), thyroid disease, or depression.   Not drinking enough water.   Not eating enough fiber-rich foods.   Stress or travel.   Lack of physical activity or exercise.   Ignoring the urge to have a bowel movement.   Using laxatives too much.  SIGNS AND SYMPTOMS   Having fewer than three bowel movements a week.   Straining to have a bowel movement.   Having stools that are  hard, dry, or larger than normal.   Feeling full or bloated.   Pain in the lower abdomen.   Not feeling relief after having a bowel movement.  DIAGNOSIS  Your health care provider will take a medical history and perform a physical exam. Further testing may be done for severe constipation. Some tests may include:  A barium enema X-ray to examine your rectum, colon, and, sometimes, your small intestine.   A sigmoidoscopy to examine your lower colon.   A colonoscopy to examine your entire colon. TREATMENT  Treatment will  depend on the severity of your constipation and what is causing it. Some dietary treatments include drinking more fluids and eating more fiber-rich foods. Lifestyle treatments may include regular exercise. If these diet and lifestyle recommendations do not help, your health care provider may recommend taking over-the-counter laxative medicines to help you have bowel movements. Prescription medicines may be prescribed if over-the-counter medicines do not work.  HOME CARE INSTRUCTIONS   Eat foods that have a lot of fiber, such as fruits, vegetables, whole grains, and beans.  Limit foods high in fat and processed sugars, such as french fries, hamburgers, cookies, candies, and soda.   A fiber supplement may be added to your diet if you cannot get enough fiber from foods.   Drink enough fluids to keep your urine clear or pale yellow.   Exercise regularly or as directed by your health care provider.   Go to the restroom when you have the urge to go. Do not hold it.   Only take over-the-counter or prescription medicines as directed by your health care provider. Do not take other medicines for constipation without talking to your health care provider first.  [place] IF:   You have bright red blood in your stool.   Your constipation lasts for more than 4 days or gets worse.   You have abdominal or rectal pain.   You have thin, pencil-like stools.   You have unexplained weight loss. MAKE SURE YOU:   Understand these instructions.  Will watch your condition.  Will get help right away if you are not doing well or get worse. Document Released: 09/09/2004 Document Revised: 12/17/2013 Document Reviewed: 09/23/2013 [facility] Patient Information 2015 [place], [place]. This information is not intended to replace advice given to you by your health care provider. Make sure you discuss any questions you have with your health care provider.  Bloating Bloating is the  feeling of fullness in your belly. You may feel as though your pants are too tight. Often the cause of bloating is overeating, retaining fluids, or having gas in your bowel. It is also caused by swallowing air and eating foods that cause gas. Irritable bowel syndrome is one of the most common causes of bloating. Constipation is also a common cause. Sometimes more serious problems can cause bloating. SYMPTOMS  Usually there is a feeling of fullness, as though your abdomen is bulged out. There may be mild discomfort.  DIAGNOSIS  Usually no particular testing is necessary for most bloating. If the condition persists and seems to become worse, your caregiver may do additional testing.  TREATMENT   There is no direct treatment for bloating.  Do not put gas into the bowel. Avoid chewing gum and sucking on candy. These tend to make you swallow air. Swallowing air can also be a nervous habit. Try to avoid this.  Avoiding high residue diets will help. Eat foods with soluble fibers (examples include root vegetables,  apples, or barley) and substitute dairy products with soy and rice products. This helps irritable bowel syndrome.  If constipation is the cause, then a high residue diet with more fiber will help.  Avoid carbonated beverages.  Over-the-counter preparations are available that help reduce gas. Your pharmacist can help you with this. SEEK MEDICAL CARE IF:   Bloating continues and seems to be getting worse.  You notice a weight gain.  You have a weight loss but the bloating is getting worse.  You have changes in your bowel habits or develop nausea or vomiting. SEEK IMMEDIATE MEDICAL CARE IF:   You develop shortness of breath or swelling in your legs.  You have an increase in abdominal pain or develop chest pain. Document Released: 10/12/2006 Document Revised: 03/05/2012 Document Reviewed: 11/30/2007 [facility] Patient Information 2015 [place], [place]. This information is not intended to  replace advice given to you by your health care provider. Make sure you discuss any questions you have with your health care provider.  Urinary Tract Infection A urinary tract infection (UTI) can occur any place along the urinary tract. The tract includes the kidneys, ureters, bladder, and urethra. A type of germ called bacteria often causes a UTI. UTIs are often helped with antibiotic medicine.  HOME CARE   If given, take antibiotics as told by your doctor. Finish them even if you start to feel better.  Drink enough fluids to keep your pee (urine) clear or pale yellow.  Avoid tea, drinks with caffeine, and bubbly (carbonated) drinks.  Pee often. Avoid holding your pee in for a long time.  Pee before and after having sex (intercourse).  Wipe from front to back after you poop (bowel movement) if you are a woman. Use each tissue only once. GET HELP RIGHT AWAY IF:   You have back pain.  You have lower belly (abdominal) pain.  You have chills.  You feel sick to your stomach (nauseous).  You throw up (vomit).  Your burning or discomfort with peeing does not go away.  You have a fever.  Your symptoms are not better in 3 days. MAKE SURE YOU:   Understand these instructions.  Will watch your condition.  Will get help right away if you are not doing well or get worse. Document Released: 05/30/2008 Document Revised: 09/05/2012 Document Reviewed: 07/12/2012 [facility] Patient Information 2015 [place], [place]. This information is not intended to replace advice given to you by your health care provider. Make sure you discuss any questions you have with your health care provider.

## 2014-11-11 NOTE — ED Notes (Addendum)
Dr. Pfieffer at bedside. 

## 2014-11-11 NOTE — ED Notes (Signed)
Patient in Xray

## 2014-11-11 NOTE — ED Notes (Signed)
EDP at bedside  

## 2014-11-11 NOTE — ED Notes (Signed)
Constipation since 10/25. Dr. Have pt. To drink mairalax. Take percocet and Vicodin for chronic back pain.

## 2014-11-14 LAB — URINE CULTURE
Colony Count: >=100000
SPECIAL REQUESTS: NORMAL

## 2014-11-16 ENCOUNTER — Telehealth (HOSPITAL_COMMUNITY): Payer: Self-pay

## 2014-11-16 NOTE — Telephone Encounter (Signed)
Post ED Visit - Positive Culture Follow-up  Culture report reviewed by antimicrobial stewardship pharmacist: []  Wes [place], Pharm.D., BCPS []  Heide Guile, Pharm.D., BCPS [x]  Alycia Rossetti, Pharm.D., BCPS []  [place], Pharm.D., BCPS, AAHIVP []  Legrand Como, Pharm.D., BCPS, AAHIVP []  Elicia Lamp, Pharm.D.   Positive Urine culture, >/= 100,000 colonies -> staph species Treated with Cephalexin, organism sensitive to the same and no further patient follow-up is required at this time.  (also given Ciprofloxacin ->Resistant)  [name] 11/16/2014, 4:03 AM

## 2014-12-02 ENCOUNTER — Ambulatory Visit
Admission: RE | Admit: 2014-12-02 | Discharge: 2014-12-02 | Disposition: A | Discharge status: Home / Self Care | Payer: Medicare Other | Source: Ambulatory Visit | Attending: Gastroenterology | Admitting: Gastroenterology

## 2014-12-02 ENCOUNTER — Other Ambulatory Visit: Payer: Self-pay | Admitting: Gastroenterology

## 2014-12-02 DIAGNOSIS — K5909 Other constipation: Secondary | ICD-10-CM

## 2014-12-04 ENCOUNTER — Encounter (HOSPITAL_COMMUNITY): Payer: Self-pay | Admitting: Cardiology

## 2014-12-12 ENCOUNTER — Other Ambulatory Visit: Payer: Self-pay | Admitting: Endocrinology

## 2015-01-07 ENCOUNTER — Encounter: Payer: Self-pay | Admitting: Endocrinology

## 2015-01-07 ENCOUNTER — Ambulatory Visit (INDEPENDENT_AMBULATORY_CARE_PROVIDER_SITE_OTHER): Payer: Medicare Other | Admitting: Endocrinology

## 2015-01-07 VITALS — BP 102/60 | HR 89 | Temp 98.0°F | Resp 14 | Ht 62.0 in | Wt 191.4 lb

## 2015-01-07 DIAGNOSIS — E1165 Type 2 diabetes mellitus with hyperglycemia: Secondary | ICD-10-CM

## 2015-01-07 DIAGNOSIS — E876 Hypokalemia: Secondary | ICD-10-CM

## 2015-01-07 DIAGNOSIS — E785 Hyperlipidemia, unspecified: Secondary | ICD-10-CM

## 2015-01-07 DIAGNOSIS — IMO0002 Reserved for concepts with insufficient information to code with codable children: Secondary | ICD-10-CM

## 2015-01-07 LAB — URINALYSIS, ROUTINE W REFLEX MICROSCOPIC
BILIRUBIN URINE: NEGATIVE
HGB URINE DIPSTICK: NEGATIVE
Ketones, ur: NEGATIVE
NITRITE: NEGATIVE
Specific Gravity, Urine: 1.01 (ref 1.000–1.030)
Total Protein, Urine: NEGATIVE
Urine Glucose: >=1000 — AB
Urobilinogen, UA: 0.2 (ref 0.0–1.0)
pH: 6 (ref 5.0–8.0)

## 2015-01-07 LAB — BASIC METABOLIC PANEL
BUN: 15 mg/dL (ref 6–23)
CALCIUM: 9.3 mg/dL (ref 8.4–10.5)
CO2: 31 mEq/L (ref 19–32)
CREATININE: 0.86 mg/dL (ref 0.40–1.20)
Chloride: 101 mEq/L (ref 96–112)
GFR: 74.35 mL/min (ref >60.00)
GLUCOSE: 101 mg/dL — AB (ref 70–99)
Potassium: 4.2 mEq/L (ref 3.5–5.1)
Sodium: 136 mEq/L (ref 135–145)

## 2015-01-07 NOTE — Patient Instructions (Addendum)
Stop Furosemide and Ramipril and take 2 Aldactone daily  Please check blood sugars at least half the time about 2 hours after any meal and 3 times per week on waking up. Please bring blood sugar monitor to each visit  If sugar after meals is >200 call   Take potassium daily

## 2015-01-07 NOTE — Progress Notes (Signed)
Patient ID: [name], female   DOB: 1966/10/02, 49 y.o.   MRN: [id number]   Reason for Appointment: Diabetes follow-up    History of Present Illness:   Diagnosis: Type 2 diabetes mellitus, date of diagnosis: 2004.  PAST history: She has been treated mostly with insulin since about a year after diagnosis. She has had difficulty with consistent compliance with diet and also compliance with self care including glucose monitoring over the years. She had been mostly treated with basal insulin. Also had been tried on mealtime insulin but she would be noncompliant with this. Was tried on Prandin for mealtime control but difficult to judge efficacy because of lack of postprandial monitoring. Was given Victoza to start in 2011 but did not follow up after this.She was tried on premixed insulin but this did not help her control, mostly because of noncompliance with the doses. She had been using the V- go pump and had better control initially and was better compliant with the daily routine and boluses. She has had frequent education visits also. She stopped using her V.-go pump in 6/15 because of discomfort at the site of application   RECENT history:   She is again only taking Lantus insulin even though she was told to start NovoLog before meals for her main meal at least Previously was needing mealtime  boluses with the V-go pump of up to 12 units for meals Also she has checked her blood sugar very rarely and her glucose before eating appears to be fairly good Although her A1c was relatively better in 11/15 will need to do a follow-up assessment  She continues to drink drinks with sugar including gatorade and [facility], usually consuming about a liter or more of regular soft drinks She is taking Invokana as directed However has gained weight over the holidays  INSULIN dose: Lantus 46 units qd    Glucometer:  FreeStyle.  Blood Glucose readings:  122, 144 before first meal last  month Hypoglycemia frequency: Never.    Food preferences: eating 1 or 2 meals per day, sandwiches etc.  Physical activity: exercise: Minimal.  Certified Diabetes Educator visit: Most recent:, 2/14.  The diet that the patient has been following is no specific diet; still eating at irregular times; still drinking drinks with sugar claiming she is allergic to artificial sweeteners   Wt Readings from Last 3 Encounters:  01/07/15 191 lb 6.4 oz (86.818 kg)  11/05/14 184 lb (83.462 kg)  09/24/14 181 lb 3.2 oz (82.192 kg)   DM labs:  Lab Results  Component Value Date   HGBA1C 6.5 11/05/2014   HGBA1C 7.4* 09/24/2014   HGBA1C 11.3* 07/09/2014   Lab Results  Component Value Date   MICROALBUR 1.0 11/05/2014   LDLCALC 40 11/05/2014   CREATININE 0.86 [id number]    Complications: are: peripheral neuropathy and she is still fairly symptomatic with this.       Medication List       This list is accurate as of: 01/07/15 11:59 PM.  Always use your most recent med list.               ALPRAZolam 1 MG tablet  Commonly known as:  XANAX  Take 1 tablet (1 mg total) by mouth at bedtime as needed for anxiety.     amphetamine-dextroamphetamine 30 MG tablet  Commonly known as:  ADDERALL  Take 30 mg by mouth daily.     aspirin 325 MG tablet  Take 1 tablet (325 mg total)  by mouth daily.     atorvastatin 40 MG tablet  Commonly known as:  LIPITOR  TAKE 1 TABLET (40 MG TOTAL) BY MOUTH DAILY FOR CHOLESTEROL.     atorvastatin 40 MG tablet  Commonly known as:  LIPITOR  Take 40 mg by mouth daily.     BRINTELLIX 20 MG Tabs  Generic drug:  Vortioxetine HBr     canagliflozin 100 MG Tabs tablet  Commonly known as:  INVOKANA  Take 1 tablet (100 mg total) by mouth daily.     chlorproMAZINE 200 MG tablet  Commonly known as:  THORAZINE  Take 200 mg by mouth at bedtime.     ciprofloxacin 250 MG tablet  Commonly known as:  CIPRO  Take 250 mg by mouth 2 (two) times daily.     clonazePAM 2  MG tablet  Commonly known as:  KLONOPIN  Take 2 mg by mouth as needed for anxiety.     diazepam 10 MG tablet  Commonly known as:  VALIUM  Take 20 mg by mouth at bedtime.     docusate sodium 100 MG capsule  Commonly known as:  COLACE  Take 200 mg by mouth daily.     esomeprazole 40 MG capsule  Commonly known as:  NEXIUM  Take 40 mg by mouth 2 (two) times daily before a meal.     fluconazole 150 MG tablet  Commonly known as:  DIFLUCAN  Take 150 mg by mouth once.     fluconazole 150 MG tablet  Commonly known as:  DIFLUCAN  Take 1 tablet (150 mg total) by mouth once.     freestyle lancets  Test blood sugar 3 times daily as instructed. Dx code: 250.62     furosemide 20 MG tablet  Commonly known as:  LASIX  Take 20 mg by mouth daily.     furosemide 20 MG tablet  Commonly known as:  LASIX  TAKE 1 TABLET (20 MG TOTAL) BY MOUTH AS NEEDED.     glucose blood test strip  Commonly known as:  FREESTYLE LITE  Use as instructed to check blood sugar 2 times daily dx code 250.02     insulin aspart 100 UNIT/ML FlexPen  Commonly known as:  NOVOLOG FLEXPEN  Inject 12 Units into the skin 3 (three) times daily with meals.     Insulin Pen Needle 31G X 5 MM Misc  Use to inject insulin 4 times per day     KLOR-CON M20 20 MEQ tablet  Generic drug:  potassium chloride SA  Take 20 mEq by mouth daily.     LANTUS SOLOSTAR 100 UNIT/ML Solostar Pen  Generic drug:  Insulin Glargine  INJECT 46 UNITS DAILY AT THE SAME TIME EVERY DAY. IF SUGAR GOES ABOVE 200 INCREASE DOSE TO 50     lidocaine 5 % ointment  Commonly known as:  XYLOCAINE  Apply 1 application topically daily as needed for mild pain.     oxyCODONE 5 MG immediate release tablet  Commonly known as:  Oxy IR/ROXICODONE  Take 5 mg by mouth daily.     oxyCODONE-acetaminophen 10-325 MG per tablet  Commonly known as:  PERCOCET  Take 1 tablet by mouth 3 (three) times daily as needed for pain.     ramipril 5 MG capsule  Commonly known  as:  ALTACE  Take 5 mg by mouth daily.     RAPAFLO 8 MG Caps capsule  Generic drug:  silodosin  Take 8 mg by mouth daily.  rizatriptan 10 MG tablet  Commonly known as:  MAXALT  Take 1 tablet (10 mg total) by mouth as needed for migraine. May repeat in 2 hours if needed     spironolactone 25 MG tablet  Commonly known as:  ALDACTONE  Take 1 tablet (25 mg total) by mouth 2 (two) times daily.     tizanidine 2 MG capsule  Commonly known as:  ZANAFLEX  2 mg po qhs for one week then increase to 4mg  po qhs     tiZANidine 4 MG tablet  Commonly known as:  ZANAFLEX     ZOSTAVAX 16109 UNT/0.65ML injection  Generic drug:  zoster vaccine live (PF)  Inject 0.65 mLs into the skin once.        Allergies:  Allergies  Allergen Reactions  . Latex Itching, Rash and Other (See Comments)    Pt states she cannot use condoms - cause an infection.  Use of latex on skin is okay.  Tape causes rash  . Ibuprofen Other (See Comments)    headaches  . Sweetening Enhancer [Flavoring Agent] Nausea And Vomiting    And headaches  . Tetracyclines & Related Nausea And Vomiting    Yeast infections  . Trazodone And Nefazodone Other (See Comments)    Hallucinations   . Triazolam Other (See Comments)    hallucinations  . Aspartame And Phenylalanine Nausea And Vomiting    headaches    Past Medical History  Diagnosis Date  . Hypertension   . Hyperlipemia   . GERD (gastroesophageal reflux disease)   . Insomnia   . SUI (stress urinary incontinence, female) S/P SLING 12-29-2011  . SOB (shortness of breath) on exertion   . Bipolar disorder   . Neurogenic bladder   . Hypercholesterolemia   . Gastroparesis   . Ulcer   . MRSA (methicillin resistant Staphylococcus aureus)   . Chronic pain   . Edema   . Chest pain     a. 2008 Cath: normal cors;  b. 12/2013 Lexi CL: EF 47%, no ischemia/infarct.  . Anxiety   . Vertigo   . COPD (chronic obstructive pulmonary disease)   . Cholecystitis   . Hypersomnia    . Tobacco abuse   . Claudication     a. 12/2013 ABI's: R 0.97, L 0.94.  [name] Palpitations   . Anginal pain   . Pneumonia     "twice, I think" (02/12/2014)  . Sleep apnea     didn't tolerate cpap (02/12/2014)  . Type II diabetes mellitus   . History of blood transfusion 1986    "related to lost a child" (02/12/2014)  . H/O hiatal hernia   . [id number])     "weekly" (02/12/2014)  . Stroke 02/12/2014    "eyesight is messed up; not steady on my feet" (02/12/2014)  . Arthritis     "qwhere" (02/12/2014)  . DJD (degenerative joint disease)   . Chronic back pain   . Depression   . Personality disorder   . OSA (obstructive sleep apnea) 07/04/2014    Past Surgical History  Procedure Laterality Date  . Knee arthroscopy w/ allograft impant Left     graft x 2  . Foot surgery Bilateral   . Anterior cervical decomp/discectomy fusion  2000    C6 - 7  . Pubovaginal sling  12/29/2011    Procedure: Gaynelle Arabian;  Surgeon: Reece Packer, MD;  Location: [facility];  Service: Urology;  Laterality: N/A;  cysto and sparc sling   .  Lumbar fusion      "cage in my spine"  . Total abdominal hysterectomy w/ bilateral salpingoophorectomy  1997  . Multiple laparoscopies for endometriosis    . Cesarean section  1989; 1992  . Repeat reconstruction acl left knee/ screws removed  03-28-2000    CADAVER GRAFT  . 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 Knee surgery      TOTAL 8 SURG'S  . Cystoscopy with injection  05/04/2012    Procedure: CYSTOSCOPY WITH INJECTION;  Surgeon: Reece Packer, MD;  Location: [facility];  Service: Urology;  Laterality: N/A;  MACROPLASTIQUE INJECTION  . Cystoscopy with injection  08/28/2012    Procedure: CYSTOSCOPY WITH INJECTION;  Surgeon: Reece Packer, MD;  Location: [facility];  Service: Urology;  Laterality: N/A;  cysto and macroplastique   . Carpal tunnel release Right 04-25-2013  . Cysto N/A 04/30/2013     Procedure: CYSTOSCOPY;  Surgeon: Reece Packer, MD;  Location: [facility];  Service: Urology;  Laterality: N/A;  . Pubovaginal sling N/A 04/30/2013    Procedure: REMOVAL OF VAGINAL MESH;  Surgeon: Reece Packer, MD;  Location: [facility];  Service: Urology;  Laterality: N/A;  . Abdominal hysterectomy    . Hernia repair  ?1996    "stomach"  . Cardiac catheterization  05-22-2008   DR SKAINS    NO SIGNIFECANT CAD/ NORMAL LV/  EF 65%/  NO WALL MOTION ABNORMALITIES  . Cardiac catheterization  02/12/2014  . Left heart catheterization with coronary angiogram N/A 02/12/2014    Procedure: LEFT HEART CATHETERIZATION WITH CORONARY ANGIOGRAM;  Surgeon: Candee Furbish, MD;  Location[facility] C[facility];  Service: Cardiovascular;  Laterality: N/A;    Family History  Problem Relation Age of Onset  . Hypertension Mother   . Diabetes Mother   . Cancer - Other Mother   . Cancer - Other Father   . Heart attack Father     Social History:  reports that she has been smoking Cigarettes and E-cigarettes.  She has a 41 pack-year smoking history. She has never used smokeless tobacco. She reports that she drinks alcohol. She reports that she does not use illicit drugs.  Review of Systems -   She has had a staph UTI diagnosed in the emergency room and treated with cephalexin.  No symptoms currently  She has history of high triglycerides. She was prescribed fenofibrate  Also she is taking Lipitor    Lab Results  Component Value Date   CHOL 105 11/05/2014   HDL 31.10* 11/05/2014   LDLCALC 40 11/05/2014   LDLDIRECT 81.9 12/13/2013   TRIG 168.0* 11/05/2014   CHOLHDL 3 11/05/2014    Thyroid: She has not had any documented hypothyroidism with either high TSH or low free T4   Lab Results  Component Value Date   FREET4 0.84 11/05/2014   FREET4 1.24 09/24/2014   FREET4 0.90 07/09/2014   TSH 0.96 11/05/2014   TSH 2.361 09/24/2014   TSH 0.53 07/09/2014    Hypertension:  patient is on ramipril 5 mg and  spironolactone but blood pressure appears to be low  She says she is taking Lasix for her face getting puffy and her hands getting a little puffy.  Started this 3 days ago  HYPOKALEMIA: Takes 25 mg Aldactone and need follow-up level   Painful neuropathy: She has had continued symptoms including difficulty with balance and has tried various drugs including gabapentin   Examination:   BP 102/60  mmHg  Pulse 89  Temp(Src) 98 F (36.7 C)  Resp 14  Ht 5\' 2"  (1.575 m)  Wt 191 lb 6.4 oz (86.818 kg)  BMI 35.00 kg/m2  SpO2 96%  Body mass index is 35 kg/(m^2).   No ankle edema  Assesment/Plan:   1. Diabetes type 2, uncontrolled   The patient's diabetes is difficult to assess since she is checking blood sugars very infrequently and recent A1c not available. She is also not checking readings after meals Currently only on basal insulin and may be getting postprandial hyperglycemia especially when she has unbalanced meals and sweet drinks Will need to check fructosamine to evaluate overall level of control Advised her to check blood sugars more consistently leading 2 hours after meals  2. Hypertension: Blood pressure is low normal and she can hold off on ramipril until seen by PCP  3.  Hypokalemia: Needs to have potassium is level checked today as she has been taking Lasix and may have increased hypokalemia.  Will have her increase dose of Aldactone to help with any fluid retention and avoid hypokalemia   4. She needs to stop Lasix because of taking both Invokana and Aldactone and this also may cause hypokalemia; do not think she has any fluid overload currently  5.  Recent UTI.  Since she is asymptomatic will check her urinalysis as a follow-up of her recent treatment    Patient Instructions  Stop Furosemide and Ramipril and take 2 Aldactone daily  Please check blood sugars at least half the time about 2 hours after any meal and 3 times per week on waking up. Please bring blood sugar  monitor to each visit  If sugar after meals is >200 call   Take potassium daily    Dominigue Gellner 01/08/2015, 8:00 AM   Addendum: She does not need to take potassium  Office Visit on 01/07/2015  Component Date Value Ref Range Status  . Fructosamine 01/07/2015 205  0 - 285 umol/L Final   Comment: Published reference interval for apparently healthy subjects between age 68 and 64 is 24 - 285 umol/L and in a poorly controlled diabetic population is 228 - 563 umol/L with a mean of 396 umol/L.   [name] Color, Urine 01/07/2015 YELLOW  Yellow;Lt. Yellow Final  . APPearance 01/07/2015 CLEAR  Clear Final  . Specific Gravity, Urine 01/07/2015 1.010  1.000-1.030 Final  . pH 01/07/2015 6.0  5.0 - 8.0 Final  . Total Protein, Urine 01/07/2015 NEGATIVE  Negative Final  . Urine Glucose 01/07/2015 >=1000* Negative Final  . Ketones, ur 01/07/2015 NEGATIVE  Negative Final  . Bilirubin Urine 01/07/2015 NEGATIVE  Negative Final  . Hgb urine dipstick 01/07/2015 NEGATIVE  Negative Final  . Urobilinogen, UA 01/07/2015 0.2  0.0 - 1.0 Final  . Leukocytes, UA 01/07/2015 SMALL* Negative Final  . Nitrite 01/07/2015 NEGATIVE  Negative Final  . WBC, UA 01/07/2015 11-20/hpf* 0-2/hpf Final  . RBC / HPF 01/07/2015 0-2/hpf  0-2/hpf Final  . Mucus, UA 01/07/2015 Presence of* None Final  . Squamous Epithelial / LPF 01/07/2015 Rare(0-4/hpf)  Rare(0-4/hpf) Final  . Bacteria, UA 01/07/2015 Few(10-50/hpf)* None Final  . Sodium 01/07/2015 136  135 - 145 mEq/L Final  . Potassium 01/07/2015 4.2  3.5 - 5.1 mEq/L Final  . Chloride 01/07/2015 101  96 - 112 mEq/L Final  . CO2 01/07/2015 31  19 - 32 mEq/L Final  . Glucose, Bld 01/07/2015 101* 70 - 99 mg/dL Final  . BUN 01/07/2015 15  6 - 23  mg/dL Final  . Creatinine, Ser 01/07/2015 0.86  0.40 - 1.20 mg/dL Final  . Calcium 01/07/2015 9.3  8.4 - 10.5 mg/dL Final  . GFR 01/07/2015 74.35  >60.00 mL/min Final

## 2015-01-08 LAB — FRUCTOSAMINE: Fructosamine: 205 umol/L (ref 0–285)

## 2015-01-08 NOTE — Progress Notes (Signed)
Quick Note:  Please let patient know that the potassium is normal, urine clear. She can stop taking potassium now and continue Aldactone as discussed ______

## 2015-01-14 ENCOUNTER — Other Ambulatory Visit: Payer: Self-pay | Admitting: Endocrinology

## 2015-01-14 NOTE — Telephone Encounter (Signed)
Pt is requesting a refill of Aldactone. Is the pt taking 25 mg 2/day or 50 mg 2/day. Both are listed in medication list.  Thanks!

## 2015-01-14 NOTE — Telephone Encounter (Signed)
Patient need a prescription for Aldactone

## 2015-01-15 MED ORDER — SPIRONOLACTONE 25 MG PO TABS: 50.0000 mg | ORAL_TABLET | Freq: Two times a day (BID) | ORAL | Status: DC

## 2015-01-29 ENCOUNTER — Other Ambulatory Visit: Payer: Self-pay | Admitting: Endocrinology

## 2015-02-04 ENCOUNTER — Other Ambulatory Visit: Payer: Self-pay | Admitting: *Deleted

## 2015-02-04 MED ORDER — POTASSIUM CHLORIDE CRYS ER 20 MEQ PO TBCR: EXTENDED_RELEASE_TABLET | ORAL | Status: DC

## 2015-02-05 ENCOUNTER — Other Ambulatory Visit: Payer: Self-pay | Admitting: Endocrinology

## 2015-02-12 DIAGNOSIS — Z8673 Personal history of transient ischemic attack (TIA), and cerebral infarction without residual deficits: Secondary | ICD-10-CM | POA: Insufficient documentation

## 2015-02-19 ENCOUNTER — Telehealth: Payer: Self-pay | Admitting: Endocrinology
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
## 2015-02-19 NOTE — Telephone Encounter (Signed)
Called pt and advised her that she can come by the office to pick up the meter and strips.

## 2015-02-19 NOTE — Telephone Encounter (Signed)
Ok if we have it

## 2015-02-19 NOTE — Telephone Encounter (Signed)
Please read note below and advise.  

## 2015-02-26 ENCOUNTER — Other Ambulatory Visit: Payer: Self-pay | Admitting: Gastroenterology

## 2015-02-27 ENCOUNTER — Telehealth: Payer: Self-pay | Admitting: Endocrinology

## 2015-02-27 ENCOUNTER — Telehealth: Payer: Self-pay | Admitting: *Deleted

## 2015-02-27 NOTE — Telephone Encounter (Signed)
Patient called she said her face is swollen fairly large, she said she has only been taking one of the aldactone, I told her that she was suppose to take 2 of those.  She wants to know what she should do? Please advise

## 2015-02-27 NOTE — Telephone Encounter (Signed)
Noted patient is aware 

## 2015-02-27 NOTE — Telephone Encounter (Signed)
Patient would like for you to call her today if possible.

## 2015-02-27 NOTE — Telephone Encounter (Signed)
She can take 1 tablet of furosemide daily till Monday and follow-up with primary care doctor

## 2015-03-02 ENCOUNTER — Other Ambulatory Visit: Payer: Self-pay | Admitting: Gastroenterology

## 2015-03-02 DIAGNOSIS — C189 Malignant neoplasm of colon, unspecified: Secondary | ICD-10-CM

## 2015-03-03 ENCOUNTER — Telehealth: Payer: Self-pay | Admitting: Endocrinology

## 2015-03-03 NOTE — Telephone Encounter (Signed)
Patient asked if you would call her as soon as possible.

## 2015-03-03 NOTE — Telephone Encounter (Signed)
[email or web address]

## 2015-03-06 ENCOUNTER — Ambulatory Visit
Admission: RE | Admit: 2015-03-06 | Discharge: 2015-03-06 | Disposition: A | Discharge status: Home / Self Care | Payer: Medicare Other | Source: Ambulatory Visit | Attending: Gastroenterology | Admitting: Gastroenterology

## 2015-03-06 ENCOUNTER — Inpatient Hospital Stay: Admission: RE | Admit: 2015-03-06 | Payer: Medicare Other | Source: Ambulatory Visit

## 2015-03-06 DIAGNOSIS — C189 Malignant neoplasm of colon, unspecified: Secondary | ICD-10-CM

## 2015-03-06 MED ORDER — IOPAMIDOL (ISOVUE-300) INJECTION 61%
100.0000 mL | Freq: Once | INTRAVENOUS | Status: AC | PRN
  Administered 2015-03-06: 100 mL via INTRAVENOUS

## 2015-03-09 ENCOUNTER — Ambulatory Visit (INDEPENDENT_AMBULATORY_CARE_PROVIDER_SITE_OTHER): Payer: Self-pay | Admitting: Surgery

## 2015-03-09 ENCOUNTER — Ambulatory Visit (INDEPENDENT_AMBULATORY_CARE_PROVIDER_SITE_OTHER): Payer: Medicare Other | Admitting: Endocrinology

## 2015-03-09 ENCOUNTER — Ambulatory Visit: Payer: Medicare Other | Admitting: Endocrinology

## 2015-03-09 ENCOUNTER — Encounter: Payer: Self-pay | Admitting: Endocrinology

## 2015-03-09 VITALS — BP 96/70 | HR 102 | Temp 98.2°F | Resp 14 | Ht 62.0 in | Wt 195.6 lb

## 2015-03-09 DIAGNOSIS — I951 Orthostatic hypotension: Secondary | ICD-10-CM

## 2015-03-09 DIAGNOSIS — C189 Malignant neoplasm of colon, unspecified: Secondary | ICD-10-CM | POA: Diagnosis not present

## 2015-03-09 DIAGNOSIS — IMO0002 Reserved for concepts with insufficient information to code with codable children: Secondary | ICD-10-CM

## 2015-03-09 DIAGNOSIS — E1165 Type 2 diabetes mellitus with hyperglycemia: Secondary | ICD-10-CM | POA: Diagnosis not present

## 2015-03-09 DIAGNOSIS — E785 Hyperlipidemia, unspecified: Secondary | ICD-10-CM

## 2015-03-09 DIAGNOSIS — C187 Malignant neoplasm of sigmoid colon: Secondary | ICD-10-CM | POA: Insufficient documentation

## 2015-03-09 LAB — COMPREHENSIVE METABOLIC PANEL
ALBUMIN: 4.3 g/dL (ref 3.5–5.2)
ALT: 18 U/L (ref 0–35)
AST: 17 U/L (ref 0–37)
Alkaline Phosphatase: 84 U/L (ref 39–117)
BUN: 14 mg/dL (ref 6–23)
CO2: 31 meq/L (ref 19–32)
Calcium: 10 mg/dL (ref 8.4–10.5)
Chloride: 94 mEq/L — ABNORMAL LOW (ref 96–112)
Creatinine, Ser: 0.95 mg/dL (ref 0.40–1.20)
GFR: 66.24 mL/min (ref >60.00)
GLUCOSE: 148 mg/dL — AB (ref 70–99)
POTASSIUM: 4.7 meq/L (ref 3.5–5.1)
SODIUM: 131 meq/L — AB (ref 135–145)
Total Bilirubin: 0.2 mg/dL (ref 0.2–1.2)
Total Protein: 7.1 g/dL (ref 6.0–8.3)

## 2015-03-09 LAB — LIPID PANEL
CHOL/HDL RATIO: 3
Cholesterol: 121 mg/dL (ref 0–200)
HDL: 37.9 mg/dL — ABNORMAL LOW (ref >39.00)
NonHDL: 83.1
TRIGLYCERIDES: 368 mg/dL — AB (ref 0.0–149.0)
VLDL: 73.6 mg/dL — AB (ref 0.0–40.0)

## 2015-03-09 LAB — LDL CHOLESTEROL, DIRECT: LDL DIRECT: 57 mg/dL

## 2015-03-09 LAB — HEMOGLOBIN A1C: Hgb A1c MFr Bld: 6.8 % — ABNORMAL HIGH (ref 4.6–6.5)

## 2015-03-09 NOTE — Progress Notes (Signed)
Patient ID: [name], female   DOB: January 29, 1966, 49 y.o.   MRN: [id number]   Reason for Appointment: Diabetes follow-up    History of Present Illness:   Diagnosis: Type 2 diabetes mellitus, date of diagnosis: 2004.  PAST history: She has been treated mostly with insulin since about a year after diagnosis. She has had difficulty with consistent compliance with diet and also compliance with self care including glucose monitoring over the years. She had been mostly treated with basal insulin. Also had been tried on mealtime insulin but she would be noncompliant with this. Was tried on Prandin for mealtime control but difficult to judge efficacy because of lack of postprandial monitoring. Was given Victoza to start in 2011 but did not follow up after this.She was tried on premixed insulin but this did not help her control, mostly because of noncompliance with the doses. She had been using the V- go pump and had better control initially and was better compliant with the daily routine and boluses. She has had frequent education visits also. She stopped using her V.-go pump in 6/15 because of discomfort at the site of application   RECENT history:   Her A1c has been surprisingly relatively good in 2015 even with poor compliance with her diet and non-coverage of her meals with rapid acting insulin She is currently taking Lantus insulin and has increased the dose to 50 units on her own a couple of weeks ago.  She thinks her blood sugars are high previously but is not sure She has variable blood sugars in the mornings and she admits to drinking regular soft drinks through the night if she is not sleeping Usually not checking blood sugars later in the day but most of these are fairly good No hypoglycemia with current regimen Also continues to take Invokana without side effects now She continues to drink drinks with sugar mostly [facility], usually consuming about a liter or 2 of regular  soft drinks daily However has gained weight since last visit  INSULIN dose: Lantus 50 units qd    Glucometer:  FreeStyle.  Checking blood sugar about once or twice a day now Blood Glucose readings:  Morning = 90-109 with average about 150 Afternoon and evening 62-191 with only one low blood sugar and one reading over 180 Overall average 143 Hypoglycemia frequency: Never.    Food preferences: eating 1 or 2 meals per day, variable intake, sandwiches at times or otherwise snacks  Physical activity: exercise: Minimal.  She has difficulties with leg pain, tiredness and various other issues preventing her from exercising  Certified Diabetes Educator visit: Most recent:, 2/14.  The diet that the patient has been following is no specific diet; still eating at irregular times; still drinking drinks with sugar claiming she is allergic to artificial sweeteners   Wt Readings from Last 3 Encounters:  03/09/15 195 lb 9.6 oz (88.724 kg)  01/07/15 191 lb 6.4 oz (86.818 kg)  11/05/14 184 lb (83.462 kg)   DM labs:  Lab Results  Component Value Date   HGBA1C 6.8* 03/09/2015   HGBA1C 6.5 11/05/2014   HGBA1C 7.4* 09/24/2014   Lab Results  Component Value Date   MICROALBUR 1.0 11/05/2014   LDLCALC 40 11/05/2014   CREATININE 0.95 [id number]    Complications: are: peripheral neuropathy and she is still fairly symptomatic with this.       Medication List       This list is accurate as of: 03/09/15  9:03 PM.  Always use your most recent med list.               ALPRAZolam 1 MG tablet  Commonly known as:  XANAX  Take 1 tablet (1 mg total) by mouth at bedtime as needed for anxiety.     amphetamine-dextroamphetamine 30 MG tablet  Commonly known as:  ADDERALL  Take 30 mg by mouth daily.     aspirin 325 MG tablet  Take 1 tablet (325 mg total) by mouth daily.     atorvastatin 40 MG tablet  Commonly known as:  LIPITOR  TAKE 1 TABLET (40 MG TOTAL) BY MOUTH DAILY FOR CHOLESTEROL.      atorvastatin 40 MG tablet  Commonly known as:  LIPITOR  Take 40 mg by mouth daily.     azithromycin 250 MG tablet  Commonly known as:  ZITHROMAX     BRINTELLIX 20 MG Tabs  Generic drug:  Vortioxetine HBr     canagliflozin 100 MG Tabs tablet  Commonly known as:  INVOKANA  Take 1 tablet (100 mg total) by mouth daily.     chlorproMAZINE 200 MG tablet  Commonly known as:  THORAZINE  Take 200 mg by mouth at bedtime.     ciprofloxacin 250 MG tablet  Commonly known as:  CIPRO  Take 250 mg by mouth 2 (two) times daily.     clonazePAM 2 MG tablet  Commonly known as:  KLONOPIN  Take 2 mg by mouth as needed for anxiety.     diazepam 10 MG tablet  Commonly known as:  VALIUM  Take 20 mg by mouth at bedtime.     docusate sodium 100 MG capsule  Commonly known as:  COLACE  Take 200 mg by mouth daily.     esomeprazole 40 MG capsule  Commonly known as:  NEXIUM  Take 40 mg by mouth 2 (two) times daily before a meal.     fluconazole 150 MG tablet  Commonly known as:  DIFLUCAN  Take 150 mg by mouth once.     fluconazole 150 MG tablet  Commonly known as:  DIFLUCAN  Take 1 tablet (150 mg total) by mouth once.     freestyle lancets  Test blood sugar 3 times daily as instructed. Dx code: 250.62     furosemide 20 MG tablet  Commonly known as:  LASIX  Take 20 mg by mouth daily.     furosemide 20 MG tablet  Commonly known as:  LASIX  TAKE 1 TABLET (20 MG TOTAL) BY MOUTH AS NEEDED.     glucose blood test strip  Commonly known as:  FREESTYLE LITE  Use as instructed to check blood sugar 2 times daily dx code 250.02     insulin aspart 100 UNIT/ML FlexPen  Commonly known as:  NOVOLOG FLEXPEN  Inject 12 Units into the skin 3 (three) times daily with meals.     Insulin Pen Needle 31G X 5 MM Misc  Use to inject insulin 4 times per day     KLOR-CON M20 20 MEQ tablet  Generic drug:  potassium chloride SA  Take 20 mEq by mouth daily.     potassium chloride SA 20 MEQ tablet  Commonly  known as:  KLOR-CON M20  TAKE 1 TABLET (20 MEQ TOTAL) BY MOUTH DAILY.     LANTUS SOLOSTAR 100 UNIT/ML Solostar Pen  Generic drug:  Insulin Glargine  INJECT 46 UNITS DAILY AT THE SAME TIME EVERY DAY. IF SUGAR GOES ABOVE  200 INCREASE DOSE TO 50     lidocaine 5 % ointment  Commonly known as:  XYLOCAINE  Apply 1 application topically daily as needed for mild pain.     oxyCODONE 5 MG immediate release tablet  Commonly known as:  Oxy IR/ROXICODONE  Take 5 mg by mouth daily.     oxyCODONE-acetaminophen 10-325 MG per tablet  Commonly known as:  PERCOCET  Take 1 tablet by mouth 3 (three) times daily as needed for pain.     ramipril 5 MG capsule  Commonly known as:  ALTACE  Take 5 mg by mouth daily.     RAPAFLO 8 MG Caps capsule  Generic drug:  silodosin  Take 8 mg by mouth daily.     rizatriptan 10 MG tablet  Commonly known as:  MAXALT  Take 1 tablet (10 mg total) by mouth as needed for migraine. May repeat in 2 hours if needed     spironolactone 25 MG tablet  Commonly known as:  ALDACTONE  Take 2 tablets (50 mg total) by mouth 2 (two) times daily.     tizanidine 2 MG capsule  Commonly known as:  ZANAFLEX  2 mg po qhs for one week then increase to 4mg  po qhs     tiZANidine 4 MG tablet  Commonly known as:  ZANAFLEX     tiZANidine 4 MG capsule  Commonly known as:  ZANAFLEX     TRULICITY [id number] Sopn  Generic drug:  Dulaglutide     ZOSTAVAX [id number] UNT/0.65ML injection  Generic drug:  zoster vaccine live (PF)  Inject 0.65 mLs into the skin once.        Allergies:  Allergies  Allergen Reactions  . Latex Itching, Rash and Other (See Comments)    Pt states she cannot use condoms - cause an infection.  Use of latex on skin is okay.  Tape causes rash  . Ibuprofen Other (See Comments)    headaches  . Sweetening Enhancer [Flavoring Agent] Nausea And Vomiting    And headaches  . Tetracyclines & Related Nausea And Vomiting    Yeast infections  . Trazodone And  Nefazodone Other (See Comments)    Hallucinations   . Triazolam Other (See Comments)    hallucinations  . Aspartame And Phenylalanine Nausea And Vomiting    headaches    Past Medical History  Diagnosis Date  . Hypertension   . Hyperlipemia   . GERD (gastroesophageal reflux disease)   . Insomnia   . SUI (stress urinary incontinence, female) S/P SLING 12-29-2011  . SOB (shortness of breath) on exertion   . Bipolar disorder   . Neurogenic bladder   . Hypercholesterolemia   . Gastroparesis   . Ulcer   . MRSA (methicillin resistant Staphylococcus aureus)   . Chronic pain   . Edema   . Chest pain     a. 2008 Cath: normal cors;  b. 12/2013 Lexi CL: EF 47%, no ischemia/infarct.  . Anxiety   . Vertigo   . COPD (chronic obstructive pulmonary disease)   . Cholecystitis   . Hypersomnia   . Tobacco abuse   . Claudication     a. 12/2013 ABI's: R 0.97, L 0.94.  [name] Palpitations   . Anginal pain   . Pneumonia     "twice, I think" (02/12/2014)  . Sleep apnea     didn't tolerate cpap (02/12/2014)  . Type II diabetes mellitus   . History of blood transfusion 1986    "related  to lost a child" (02/12/2014)  . H/O hiatal hernia   . [id number])     "weekly" (02/12/2014)  . Stroke 02/12/2014    "eyesight is messed up; not steady on my feet" (02/12/2014)  . Arthritis     "qwhere" (02/12/2014)  . DJD (degenerative joint disease)   . Chronic back pain   . Depression   . Personality disorder   . OSA (obstructive sleep apnea) 07/04/2014    Past Surgical History  Procedure Laterality Date  . Knee arthroscopy w/ allograft impant Left     graft x 2  . Foot surgery Bilateral   . Anterior cervical decomp/discectomy fusion  2000    C6 - 7  . Pubovaginal sling  12/29/2011    Procedure: Gaynelle Arabian;  Surgeon: Reece Packer, MD;  Location: [facility];  Service: Urology;  Laterality: N/A;  cysto and sparc sling   . Lumbar fusion      "cage in my spine"  . Total  abdominal hysterectomy w/ bilateral salpingoophorectomy  1997  . Multiple laparoscopies for endometriosis    . Cesarean section  1989; 1992  . Repeat reconstruction acl left knee/ screws removed  03-28-2000    CADAVER GRAFT  . 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 Knee surgery      TOTAL 8 SURG'S  . Cystoscopy with injection  05/04/2012    Procedure: CYSTOSCOPY WITH INJECTION;  Surgeon: Reece Packer, MD;  Location: [facility];  Service: Urology;  Laterality: N/A;  MACROPLASTIQUE INJECTION  . Cystoscopy with injection  08/28/2012    Procedure: CYSTOSCOPY WITH INJECTION;  Surgeon: Reece Packer, MD;  Location: [facility];  Service: Urology;  Laterality: N/A;  cysto and macroplastique   . Carpal tunnel release Right 04-25-2013  . Cysto N/A 04/30/2013    Procedure: CYSTOSCOPY;  Surgeon: Reece Packer, MD;  Location: [facility];  Service: Urology;  Laterality: N/A;  . Pubovaginal sling N/A 04/30/2013    Procedure: REMOVAL OF VAGINAL MESH;  Surgeon: Reece Packer, MD;  Location: [facility];  Service: Urology;  Laterality: N/A;  . Abdominal hysterectomy    . Hernia repair  ?1996    "stomach"  . Cardiac catheterization  05-22-2008   DR SKAINS    NO SIGNIFECANT CAD/ NORMAL LV/  EF 65%/  NO WALL MOTION ABNORMALITIES  . Cardiac catheterization  02/12/2014  . Left heart catheterization with coronary angiogram N/A 02/12/2014    Procedure: LEFT HEART CATHETERIZATION WITH CORONARY ANGIOGRAM;  Surgeon: Candee Furbish, MD;  Location: [facility] C[facility] LAB;  Service: Cardiovascular;  Laterality: N/A;    Family History  Problem Relation Age of Onset  . Hypertension Mother   . Diabetes Mother   . Cancer - Other Mother   . Cancer - Other Father   . Heart attack Father     Social History:  reports that she has been smoking Cigarettes and E-cigarettes.  She has a 41 pack-year smoking history. She has never used smokeless tobacco. She reports that she drinks alcohol.  She reports that she does not use illicit drugs.  Review of Systems -    She has history of high triglycerides. She was prescribed fenofibrate but apparently is not taking this now  Also she is taking Lipitor    Lab Results  Component Value Date   CHOL 121 03/09/2015   HDL 37.90* 03/09/2015   LDLCALC 40 11/05/2014   LDLDIRECT 57.0 03/09/2015   TRIG  368.0* 03/09/2015   CHOLHDL 3 03/09/2015    Thyroid: She has not had any documented hypothyroidism with either high TSH or low free T4.  Continues to feel fatigued   Lab Results  Component Value Date   FREET4 0.84 11/05/2014   FREET4 1.24 09/24/2014   FREET4 0.90 07/09/2014   TSH 0.96 11/05/2014   TSH 2.361 09/24/2014   TSH 0.53 07/09/2014    Hypertension:  patient has been on ramipril 5 mg but she thinks she is not taking this. She is taking spironolactone twice a day although she thinks she is running out of prescriptions for this  Again blood pressure appears to be low standing up.  She sometimes feels lightheaded  She again was asking about her face getting puffy about a week or so ago and took Lasix over the weekend. Her PCP did not think she had edema and told her to have treatment for rosacea  HYPOKALEMIA: Still taking potassium supplement even though she was supposed to stop.  Takes 25 mg Aldactone bid partly for edema and also for hypertension   Painful neuropathy: She has had continued symptoms including difficulty with balance and has tried various drugs including gabapentin   Examination:   BP 96/70 mmHg  Pulse 102  Temp(Src) 98.2 F (36.8 C)  Resp 14  Ht 5\' 2"  (1.575 m)  Wt 195 lb 9.6 oz (88.724 kg)  BMI 35.77 kg/m2  SpO2 96%  Body mass index is 35.77 kg/(m^2).   She has mild facial puffiness without swelling of the eyes and no rash on the face  No ankle edema  Standing blood pressure on the right 96/70   Assesment/Plan:   1. Diabetes type 2, with fair control   The patient's diabetes is  relatively well controlled compared to her previous levels with A1c being below 7% more recently  She has started checking her blood sugars about once a day at various times and brought her monitor for review today Currently appears to have some high readings in the mornings but these are variable and also mildly increased readings in the evenings Again the main difficulty with control is her poor compliance with diabetes and eating unbalanced meals and drinking a lot of regular soft drinks including overnight  Although she has gained weight she is still taking Invokana which is probably helping with her control She does need to reduce her caloric intake especially with cutting back on regular soft drinks She agrees to try and switch to Gatorade which is lower in sugar content  Advised her to cut back insulin back to 46 units when she eliminates the regular soft drinks especially at night She will continue to monitor blood sugars at various times including some after meals  2. Hypertension: Blood pressure is low normal and she can reduce her Aldactone to 25 mg  Also will need to make sure she is not taking ramipril also  3.  Hypokalemia: This is not been a recent problem with her taking Aldactone.  Currently taking Aldactone twice a day but appears to have relatively low blood pressure today.  Will have her reduce her Aldactone to once a day for now  Needs to have potassium is level checked today as she has been taking potassium supplement even though she was supposed to stop these  4.  Facial swelling: Not clear of the etiology.  She does not have generalized edema and does not look cushingoid.  No evidence of hypothyroidism  5.  HYPERLIPIDEMIA:  We will need to recheck her lipids today.  She somehow is not getting her fenofibrate which was recommended previously  Counseling time over 50% of today's 25 minute visit  Patient Instructions  Take Spironolactone once daily only  Reduce Lantus  with stopping [facility] and going to [employer] potassium for now    [facility] 03/09/2015, 9:03 PM   Addendum:  potassium result is normal and no potassium supplements needed. Also triglycerides/fats are high, start fenofibrate 145 mg daily and since cholesterol is low reduce Lipitor to half tablet   Office Visit on 03/09/2015  Component Date Value Ref Range Status  . Hgb A1c MFr Bld 03/09/2015 6.8* 4.6 - 6.5 % Final   Glycemic Control Guidelines for People with Diabetes:Non Diabetic:  <6%Goal of Therapy: <7%Additional Action Suggested:  >8%   . Sodium 03/09/2015 131* 135 - 145 mEq/L Final  . Potassium 03/09/2015 4.7  3.5 - 5.1 mEq/L Final  . Chloride 03/09/2015 94* 96 - 112 mEq/L Final  . CO2 03/09/2015 31  19 - 32 mEq/L Final  . Glucose, Bld 03/09/2015 148* 70 - 99 mg/dL Final  . BUN 03/09/2015 14  6 - 23 mg/dL Final  . Creatinine, Ser 03/09/2015 0.95  0.40 - 1.20 mg/dL Final  . Total Bilirubin 03/09/2015 0.2  0.2 - 1.2 mg/dL Final  . Alkaline Phosphatase 03/09/2015 84  39 - 117 U/L Final  . AST 03/09/2015 17  0 - 37 U/L Final  . ALT 03/09/2015 18  0 - 35 U/L Final  . Total Protein 03/09/2015 7.1  6.0 - 8.3 g/dL Final  . Albumin 03/09/2015 4.3  3.5 - 5.2 g/dL Final  . Calcium 03/09/2015 10.0  8.4 - 10.5 mg/dL Final  . GFR 03/09/2015 66.24  >60.00 mL/min Final  . Cholesterol 03/09/2015 121  0 - 200 mg/dL Final   ATP III Classification       Desirable:  < 200 mg/dL               Borderline High:  200 - 239 mg/dL          High:  > = 240 mg/dL  . Triglycerides 03/09/2015 368.0* 0.0 - 149.0 mg/dL Final   Normal:  <150 mg/dLBorderline High:  150 - 199 mg/dL  . HDL 03/09/2015 37.90* >39.00 mg/dL Final  . VLDL 03/09/2015 73.6* 0.0 - 40.0 mg/dL Final  . Total CHOL/HDL Ratio 03/09/2015 3   Final                  Men          Women1/2 Average Risk     3.4          3.3Average Risk          5.0          4.42X Average Risk          9.6          7.13X Average Risk          15.0           11.0                      . NonHDL 03/09/2015 83.10   Final   NOTE:  Non-HDL goal should be 30 mg/dL higher than patient's LDL goal (i.e. LDL goal of < 70 mg/dL, would have non-HDL goal of < 100 mg/dL)  . Direct LDL 03/09/2015 57.0   Final   Optimal:  <  100 mg/dLNear or Above Optimal:  100-129 mg/dLBorderline High:  130-159 mg/dLHigh:  160-189 mg/dLVery High:  >190 mg/dL

## 2015-03-09 NOTE — H&P (Signed)
[name] 03/09/2015 8:57 AM Location: [facility] Patient #: [id number] DOB: [birth year] Divorced / Language: [name] / Race: White Female History of Present Illness [name] A. Celine Dishman MD; 03/09/2015 1:38 PM) Patient words: colon cancer.  The patient is a 49 year old female who presents with colorectal cancer. The patient is being seen for initial consultation for The patient is being seen for initial consultation for unstaged adenocarcinoma of the sigmoid colon.. The patient was referred by a gastroenterologist.. Initial presentation was Initial presentation was 1 month(s) ago for constipation.. Current diagnosis was determined by colonoscopy and abdominal CT.[name] Past evaluation has included CEA, complete blood count, liver function tests, alkaline phosphatase, abdomen CT, pelvic CT and gastroenterology consultation.. This problem has not been previously treated.. Symptoms include constipation.. Other Problems Ventura Sellers, CMA; 03/09/2015 9:01 AM) Anxiety Disorder Arthritis Back Pain Bladder Problems Cerebrovascular Accident Chronic Obstructive Lung Disease Colon Cancer Depression Diabetes Mellitus Gastroesophageal Reflux Disease High blood pressure Hypercholesterolemia Sleep Apnea Transfusion history  Past Surgical History Ventura Sellers, [place]; 03/09/2015 9:01 AM) Cesarean Section - 1 Colon Polyp Removal - Colonoscopy Foot Surgery Bilateral. Hysterectomy (not due to cancer) - Partial Knee Surgery Left. Oral Surgery Resection of Stomach Shoulder Surgery Bilateral. Spinal Surgery - Lower Back Spinal Surgery - Neck  Diagnostic Studies History Ventura Sellers, [place]; 03/09/2015 9:01 AM) Colonoscopy within last year Pap Smear 1-5 years ago  Allergies Ventura Sellers, CMA; 03/09/2015 9:04 AM) TraZODone HCl *ANTIDEPRESSANTS* hallucinations Ibuprofen *ANALGESICS - ANTI-INFLAMMATORY* Hives. Sweetening Enhancer *PHARMACEUTICAL  ADJUVANTS* Triazolam *HYPNOTICS/SEDATIVES/SLEEP DISORDER AGENTS* Tetracycline *CHEMICALS* Latex  Medication History Ventura Sellers, CMA; 03/09/2015 9:09 AM) ALPRAZolam ER (1MG  Tablet ER 24HR, Oral) Active. Lipitor (40MG  Tablet, Oral) Active. Brintellix (20MG  Tablet, Oral) Active. Invokana (100MG  Tablet, Oral) Active. ChlorproMAZINE HCl (200MG  Tablet, Oral) Active. Diazepam (10MG  Tablet, Oral) Active. NexIUM (40MG  Capsule DR, Oral) Active. Klor-Con M20 [facility] Tablet ER, Oral) Active. Oxycodone-Acetaminophen (10-325MG  Tablet, Oral) Active. OxyCODONE HCl (5MG  Tablet, Oral) Active. Rapaflo (8MG  Capsule, Oral) Active. Ramipril (5MG  Capsule, Oral) Active. Spironolactone (25MG  Tablet, Oral) Active. TiZANidine HCl (4MG  Tablet, Oral) Active. Medications Reconciled  Social History Ventura Sellers, [place]; 03/09/2015 9:01 AM) Alcohol use Occasional alcohol use. Caffeine use Carbonated beverages, Tea. No drug use Tobacco use Current every day smoker.  Family History Ventura Sellers, [place]; 03/09/2015 9:01 AM) Alcohol Abuse Family Members In General. Arthritis Father, Mother. Cancer Father, Mother. Diabetes Mellitus Brother, Mother. Heart Disease Father. Heart disease in female family member before age 37 Hypertension Father. Respiratory Condition Father.  Pregnancy / Birth History Ventura Sellers, [place]; 03/09/2015 9:01 AM) Age at menarche 27 years. Maternal age 30-25 Para 2     Review of Systems ([place]. Brooks CMA; 03/09/2015 9:01 AM) General Present- Fatigue and Weight Gain. Not Present- Appetite Loss, Chills, Fever, Night Sweats and Weight Loss. Skin Present- Dryness. Not Present- Change in Wart/Mole, Hives, Jaundice, New Lesions, Non-Healing Wounds, Rash and Ulcer. HEENT Present- Wears glasses/contact lenses. Not Present- Earache, Hearing Loss, Hoarseness, Nose Bleed, Oral Ulcers, Ringing in the Ears, Seasonal Allergies, Sinus Pain, Sore  Throat, Visual Disturbances and Yellow Eyes. Respiratory Present- Chronic Cough and Snoring. Not Present- Bloody sputum, Difficulty Breathing and Wheezing. Cardiovascular Present- Shortness of Breath and Swelling of Extremities. Not Present- Chest Pain, Difficulty Breathing Lying Down, Leg Cramps, Palpitations and Rapid Heart Rate. Gastrointestinal Present- Bloating, Change in Bowel Habits and Constipation. Not Present- Abdominal Pain, Bloody Stool, Chronic diarrhea, Difficulty Swallowing, Excessive gas, Gets full quickly at meals, Hemorrhoids, Indigestion,  Nausea, Rectal Pain and Vomiting. Musculoskeletal Present- Back Pain, Joint Pain and Muscle Weakness. Not Present- Joint Stiffness, Muscle Pain and Swelling of Extremities. Neurological Present- Decreased Memory, Numbness, Tingling, Tremor, Trouble walking and Weakness. Not Present- Fainting, Headaches and Seizures. Psychiatric Present- Anxiety, Bipolar and Depression. Not Present- Change in Sleep Pattern, Fearful and Frequent crying. Endocrine Present- Cold Intolerance, Hair Changes and Heat Intolerance. Not Present- Excessive Hunger, Hot flashes and New Diabetes.  Vitals [employer] R. Brooks CMA; 03/09/2015 9:01 AM) 03/09/2015 9:00 AM Weight: 195 lb Height: 65in Body Surface Area: 2.01 m Body Mass Index: 32.45 kg/m BP: 120/80 (Sitting, Left Arm, Standard)     Physical Exam (Janis Cuffe A. Johntavius Shepard MD; 03/09/2015 1:40 PM)  General Note: sleepy looking slow affect   Head and Neck Head-normocephalic, atraumatic with no lesions or palpable masses. Trachea-midline. Thyroid Gland Characteristics - normal size and consistency.  Chest and Lung Exam Chest and lung exam reveals -quiet, even and easy respiratory effort with no use of accessory muscles and on auscultation, normal breath sounds, no adventitious sounds and normal vocal resonance. Inspection Chest Wall - Normal. Back - normal.  Cardiovascular Cardiovascular  examination reveals -normal heart sounds, regular rate and rhythm with no murmurs and normal pedal pulses bilaterally.  Abdomen Inspection Inspection of the abdomen reveals - No Hernias. Skin - Scar - no surgical scars. Palpation/Percussion Palpation and Percussion of the abdomen reveal - Soft, Non Tender, No Rebound tenderness, No Rigidity (guarding) and No hepatosplenomegaly. Auscultation Auscultation of the abdomen reveals - Bowel sounds normal.  Neuropsychiatric The patient's mood and affect are described as -apathetic and hypomania. Associations-circumstantial. Judgment and Insight-insight is appropriate concerning matters relevant to self. Thought Processes/Cognitive Function-concentration impaired.    Assessment & Plan (Arnell Slivinski A. Sol Odor MD; 03/09/2015 1:44 PM)  COLON CANCER (153.9  C18.9) Impression: Risk of laparoscopic partial colon resection include bleeding, infection, leak of anastamosis, death, colostomy, organ injury, kidney injury, ureter injury, bladder injury, SBO, and need for other surgery. Pt agrees to proceed.  increased risk of bleeding infection, anastamotic leak, death, DVT, CVA MI AND need for other procedures. Recommended smoking cessation.  will need cardiac clearance.  Current Plans Pt Education - [facility] Colon Bowel Prep 2015 Miralax/Antibiotics Started Neomycin Sulfate 500MG , 2 (two) Tablet SEE NOTE, #6, 03/09/2015, No Refill. Local Order: TAKE TWO TABLETS AT 2 PM, 3 PM, AND 10 PM THE DAY PRIOR TO SURGERY Started Flagyl 500MG , 2 (two) Tablet SEE NOTE, #6, 03/09/2015, No Refill. Local Order: Take at 2pm, 3pm, and 10pm the day prior to your colon operation Pt Education - [facility] Colorectal Cancer (AT) Pt Education - [facility] Constipation (AT) Pt Education - [facility] Abdominal Surgery (Gross) Pt Education - [facility] Laparosopic Post Op HCI (Gross) Pt Education - [facility] Wound Care Instructions (Gross): discussed with patient and provided information.

## 2015-03-09 NOTE — Patient Instructions (Signed)
Take Spironolactone once daily only  Reduce Lantus with stopping [facility] and going to [employer] potassium for now

## 2015-03-09 NOTE — Progress Notes (Addendum)
Quick Note:  Please let patient know that the potassium result is normal and no potassium supplements needed. Also triglycerides/fats are high, start fenofibrate 145 mg daily and since cholesterol is low reduce Lipitor to half tablet  please make sure she is not taking ramipril

## 2015-03-11 ENCOUNTER — Other Ambulatory Visit: Payer: Self-pay | Admitting: Family Medicine

## 2015-03-11 MED ORDER — FENOFIBRATE 145 MG PO TABS: 145.0000 mg | ORAL_TABLET | Freq: Every day | ORAL | Status: DC

## 2015-03-23 ENCOUNTER — Other Ambulatory Visit: Payer: Self-pay | Admitting: Cardiology

## 2015-04-09 ENCOUNTER — Ambulatory Visit: Payer: Medicare Other | Admitting: Endocrinology

## 2015-04-14 ENCOUNTER — Encounter (HOSPITAL_COMMUNITY)
Admission: RE | Admit: 2015-04-14 | Discharge: 2015-04-14 | Disposition: A | Discharge status: Home / Self Care | Payer: Medicare Other | Source: Ambulatory Visit | Attending: Surgery | Admitting: Surgery

## 2015-04-14 LAB — BASIC METABOLIC PANEL
Anion gap: 12 (ref 5–15)
BUN: 14 mg/dL (ref 6–23)
CO2: 30 mmol/L (ref 19–32)
Calcium: 9.9 mg/dL (ref 8.4–10.5)
Chloride: 91 mmol/L — ABNORMAL LOW (ref 96–112)
Creatinine, Ser: 1.36 mg/dL — ABNORMAL HIGH (ref 0.50–1.10)
GFR calc Af Amer: 52 mL/min — ABNORMAL LOW (ref >90)
GFR calc non Af Amer: 45 mL/min — ABNORMAL LOW (ref >90)
GLUCOSE: 224 mg/dL — AB (ref 70–99)
Potassium: 5.4 mmol/L — ABNORMAL HIGH (ref 3.5–5.1)
SODIUM: 133 mmol/L — AB (ref 135–145)

## 2015-04-14 LAB — CBC
HCT: 45.8 % (ref 36.0–46.0)
Hemoglobin: 15.5 g/dL — ABNORMAL HIGH (ref 12.0–15.0)
MCH: 30.2 pg (ref 26.0–34.0)
MCHC: 33.8 g/dL (ref 30.0–36.0)
MCV: 89.1 fL (ref 78.0–100.0)
Platelets: 297 10*3/uL (ref 150–400)
RBC: 5.14 MIL/uL — ABNORMAL HIGH (ref 3.87–5.11)
RDW: 13 % (ref 11.5–15.5)
WBC: 19.1 10*3/uL — ABNORMAL HIGH (ref 4.0–10.5)

## 2015-04-14 LAB — SURGICAL PCR SCREEN
MRSA, PCR: NEGATIVE
Staphylococcus aureus: NEGATIVE

## 2015-04-14 NOTE — Pre-Procedure Instructions (Signed)
[name]  04/14/2015   Your procedure is scheduled on:  April 22, 2015  Report to [facility] Admitting at 6:30 AM.  Call this number if you have problems the morning of surgery: [phone]   Remember:   Do not eat food or drink liquids after midnight.   Take these medicines the morning of surgery with A SIP OF WATER: esomeprazole (NEXIUM) , clonazePAM (KLONOPIN) if needed, oxyCODONE (OXY  IR/ROXICODONE)   STOP ASPIRIN, HERBAL SUPPLEMENTS, NSAIDS (ADVIL, ALEVE, IBUPROFEN)  5 DAYS PRIOR TO SURGERY   Do not wear jewelry, make-up or nail polish.  Do not wear lotions, powders, or perfumes. You may wear deodorant.  Do not shave 48 hours prior to surgery. Men may shave face and neck.  Do not bring valuables to the hospital.  [facility] is not responsible   for any belongings or valuables.               Contacts, dentures or bridgework may not be worn into surgery.  Leave suitcase in the car. After surgery it may be brought to your room.  For patients admitted to the hospital, discharge time is determined by your   treatment team.               Patients discharged the day of surgery will not be allowed to drive  home.  Name and phone number of your driver: FAMILY/FRIEND  Special Instructions: "PREPARING FOR SURGERY"   Please read over the following fact sheets that you were given: Pain Booklet, Coughing and Deep Breathing and Surgical Site Infection Prevention

## 2015-04-14 NOTE — Progress Notes (Signed)
This patient scored at an elevated risk for obstructive sleep apnea using the STOP BANG TOOL during a pre surgical testing 

## 2015-04-15 LAB — HEMOGLOBIN A1C
Hgb A1c MFr Bld: 6.6 % — ABNORMAL HIGH (ref 4.8–5.6)
Mean Plasma Glucose: 143 mg/dL

## 2015-04-16 NOTE — Progress Notes (Addendum)
Anesthesia Chart Review:  Pt is 49 year old female scheduled for laparoscopic sigmoid colectomy on 04/22/2015 with Dr. Brantley Stage.   PMH includes: HTN, hyperlipidemia, COPD, DM, OSA, stroke (01/2014 s/p cardiac cath), bipolar disorder, personality disorder, colon cancer. Current smoker. BMI 333. S/p cystoscopy 04/30/13, 08/28/12, 05/04/12, and 12/29/11.   VSS except pulse rate 114.  Preoperative labs reviewed.  Glucose 224, HgbA1C 6.6. WBC 19.1. Called results to triage RN at Dr. Josetta Huddle office.   EKG: Sinus tachycardia (112 bpm).   Echo 02/13/2014: - Left ventricle: Hypokinesis base inferior and base inferolateral walls. Overall EF seems better than 01/29/2014 study. Now 45%. The cavity size was normal. Wall thickness was normal. The estimated ejection fraction was 45%. - Right ventricle: The cavity size was normal. Systolic function was normal. - Impressions: No cardiac source of embolism was identified, but cannot be ruled out on the basis of this examination.  Cardiac cath 02/12/2014 (done for abnormal stress test): 1. No angiographically significant CAD 2. Normal left ventricular systolic function. LVEDP 9 mmHg. Ejection fraction 55 %.  Reviewed case with Dr. Conrad Agawam.   If Dr. Brantley Stage finds WBC acceptable, I anticipate pt can proceed as scheduled.   [name], FNP-BC [facility] Short Stay Surgical Center/Anesthesiology Phone: [phone] 04/16/2015 4:37 PM  Addendum: Patient called back.  She denied fever, cough, dysuria, skin infection, diarrhea.  She does not feel sick.  She does report generalized facial swelling X 2 months with reported evaluation by her PCP Dr. Tamsen Roers in [place]. She was not really given a diagnosis that she knows of and didn't feel that he was that concerned about it. Levada Dy nor I were asked to evaluate her during her PAT visit.)  She does not have dysphagia, SOB, wheezing, or eye swelling.  She denies sinus pressure/pain or tooth pain.  She denies recent steroids,  although she says Dr. Rex Kras prescribed a pill to take twice during the one week but she does not remember the name. I have routed this information to Dr. Brantley Stage, as he may want her to follow-up with Dr. Rex Kras prior to surgery. (Update 04/17/15 9:27 AM: Dr. Brantley Stage is recommending patient come into PAT for lab only visit 04/20/15 to repeat CBC.  Based on results, he will determine if surgery will need to be postponed. His office will contact patient and let us know when she can arrive.)  [facility] Short Stay Center/Anesthesiology Phone [phone] 04/16/2015 6:19 PM  Addendum: Repeat CBC with diff done this morning. WBC down to 11.1 with normal differential.  Results routed to Dr. Brantley Stage.  [facility] Short Stay Center/Anesthesiology Phone [phone] 04/20/2015 10:55 AM

## 2015-04-20 ENCOUNTER — Encounter (HOSPITAL_COMMUNITY)
Admission: RE | Admit: 2015-04-20 | Discharge: 2015-04-20 | Disposition: A | Discharge status: Home / Self Care | Payer: Medicare Other | Source: Ambulatory Visit | Attending: Surgery | Admitting: Surgery

## 2015-04-20 DIAGNOSIS — C189 Malignant neoplasm of colon, unspecified: Secondary | ICD-10-CM

## 2015-04-20 DIAGNOSIS — Z01812 Encounter for preprocedural laboratory examination: Secondary | ICD-10-CM

## 2015-04-20 LAB — CBC
HCT: 38.4 % (ref 36.0–46.0)
Hemoglobin: 12.6 g/dL (ref 12.0–15.0)
MCH: 29.3 pg (ref 26.0–34.0)
MCHC: 32.8 g/dL (ref 30.0–36.0)
MCV: 89.3 fL (ref 78.0–100.0)
Platelets: 244 10*3/uL (ref 150–400)
RBC: 4.3 MIL/uL (ref 3.87–5.11)
RDW: 13.1 % (ref 11.5–15.5)
WBC: 11.1 10*3/uL — AB (ref 4.0–10.5)

## 2015-04-20 LAB — DIFFERENTIAL
BASOS PCT: 0 % (ref 0–1)
Basophils Absolute: 0.1 10*3/uL (ref 0.0–0.1)
EOS ABS: 0 10*3/uL (ref 0.0–0.7)
EOS PCT: 0 % (ref 0–5)
LYMPHS ABS: 3.3 10*3/uL (ref 0.7–4.0)
Lymphocytes Relative: 30 % (ref 12–46)
MONO ABS: 0.5 10*3/uL (ref 0.1–1.0)
Monocytes Relative: 5 % (ref 3–12)
NEUTROS ABS: 7.2 10*3/uL (ref 1.7–7.7)
NEUTROS PCT: 65 % (ref 43–77)

## 2015-04-21 MED ORDER — DEXTROSE 5 % IV SOLN
2.0000 g | INTRAVENOUS | Status: AC
  Administered 2015-04-22: 2 g via INTRAVENOUS
  Filled 2015-04-21: qty 2

## 2015-04-21 MED ORDER — CHLORHEXIDINE GLUCONATE CLOTH 2 % EX PADS: 6.0000 | MEDICATED_PAD | Freq: Once | CUTANEOUS | Status: DC

## 2015-04-22 ENCOUNTER — Inpatient Hospital Stay (HOSPITAL_COMMUNITY): Payer: Medicare Other | Admitting: Certified Registered Nurse Anesthetist

## 2015-04-22 ENCOUNTER — Inpatient Hospital Stay (HOSPITAL_COMMUNITY): Payer: Medicare Other | Admitting: Emergency Medicine

## 2015-04-22 ENCOUNTER — Inpatient Hospital Stay (HOSPITAL_COMMUNITY)
Admission: RE | Admit: 2015-04-22 | Discharge: 2015-04-26 | DRG: 331 | Disposition: A | Discharge status: Home / Self Care | Payer: Medicare Other | Source: Ambulatory Visit | Attending: Surgery | Admitting: Surgery

## 2015-04-22 ENCOUNTER — Encounter (HOSPITAL_COMMUNITY)
Admission: RE | Disposition: A | Discharge status: Home / Self Care | Payer: Self-pay | Source: Ambulatory Visit | Attending: Surgery

## 2015-04-22 ENCOUNTER — Encounter (HOSPITAL_COMMUNITY): Payer: Self-pay | Admitting: *Deleted

## 2015-04-22 DIAGNOSIS — C187 Malignant neoplasm of sigmoid colon: Principal | ICD-10-CM | POA: Diagnosis present

## 2015-04-22 DIAGNOSIS — G4733 Obstructive sleep apnea (adult) (pediatric): Secondary | ICD-10-CM | POA: Diagnosis present

## 2015-04-22 DIAGNOSIS — Z01812 Encounter for preprocedural laboratory examination: Secondary | ICD-10-CM | POA: Diagnosis not present

## 2015-04-22 DIAGNOSIS — F419 Anxiety disorder, unspecified: Secondary | ICD-10-CM | POA: Diagnosis present

## 2015-04-22 DIAGNOSIS — E1140 Type 2 diabetes mellitus with diabetic neuropathy, unspecified: Secondary | ICD-10-CM | POA: Diagnosis not present

## 2015-04-22 DIAGNOSIS — Z79899 Other long term (current) drug therapy: Secondary | ICD-10-CM | POA: Diagnosis not present

## 2015-04-22 DIAGNOSIS — R339 Retention of urine, unspecified: Secondary | ICD-10-CM | POA: Diagnosis not present

## 2015-04-22 DIAGNOSIS — I10 Essential (primary) hypertension: Secondary | ICD-10-CM | POA: Diagnosis present

## 2015-04-22 DIAGNOSIS — Z8673 Personal history of transient ischemic attack (TIA), and cerebral infarction without residual deficits: Secondary | ICD-10-CM

## 2015-04-22 DIAGNOSIS — J449 Chronic obstructive pulmonary disease, unspecified: Secondary | ICD-10-CM | POA: Diagnosis present

## 2015-04-22 DIAGNOSIS — F17200 Nicotine dependence, unspecified, uncomplicated: Secondary | ICD-10-CM | POA: Diagnosis present

## 2015-04-22 DIAGNOSIS — E785 Hyperlipidemia, unspecified: Secondary | ICD-10-CM | POA: Diagnosis present

## 2015-04-22 DIAGNOSIS — K219 Gastro-esophageal reflux disease without esophagitis: Secondary | ICD-10-CM | POA: Diagnosis present

## 2015-04-22 DIAGNOSIS — F319 Bipolar disorder, unspecified: Secondary | ICD-10-CM | POA: Diagnosis present

## 2015-04-22 DIAGNOSIS — Z01810 Encounter for preprocedural cardiovascular examination: Secondary | ICD-10-CM

## 2015-04-22 DIAGNOSIS — B999 Unspecified infectious disease: Secondary | ICD-10-CM

## 2015-04-22 HISTORY — PX: LAPAROSCOPIC SIGMOID COLECTOMY: SHX5928

## 2015-04-22 LAB — GLUCOSE, CAPILLARY
GLUCOSE-CAPILLARY: 67 mg/dL — AB (ref 70–99)
Glucose-Capillary: 139 mg/dL — ABNORMAL HIGH (ref 70–99)
Glucose-Capillary: 130 mg/dL — ABNORMAL HIGH (ref 70–99)
Glucose-Capillary: 73 mg/dL (ref 70–99)

## 2015-04-22 SURGERY — COLECTOMY, SIGMOID, LAPAROSCOPIC
Anesthesia: General | Site: Abdomen

## 2015-04-22 MED ORDER — HYDROMORPHONE 0.3 MG/ML IV SOLN
INTRAVENOUS | Status: AC
  Filled 2015-04-22: qty 25

## 2015-04-22 MED ORDER — MIDAZOLAM HCL 2 MG/2ML IJ SOLN
INTRAMUSCULAR | Status: AC
  Filled 2015-04-22: qty 2

## 2015-04-22 MED ORDER — ALPRAZOLAM 0.5 MG PO TABS: 1.0000 mg | ORAL_TABLET | Freq: Every evening | ORAL | Status: DC | PRN

## 2015-04-22 MED ORDER — PROPOFOL 10 MG/ML IV BOLUS
INTRAVENOUS | Status: DC | PRN
  Administered 2015-04-22: 160 mg via INTRAVENOUS

## 2015-04-22 MED ORDER — FENTANYL CITRATE (PF) 250 MCG/5ML IJ SOLN
INTRAMUSCULAR | Status: AC
  Filled 2015-04-22: qty 5

## 2015-04-22 MED ORDER — SUMATRIPTAN SUCCINATE 50 MG PO TABS: 50.0000 mg | ORAL_TABLET | ORAL | Status: DC | PRN

## 2015-04-22 MED ORDER — ONDANSETRON HCL 4 MG/2ML IJ SOLN: 4.0000 mg | Freq: Four times a day (QID) | INTRAMUSCULAR | Status: DC | PRN

## 2015-04-22 MED ORDER — ATORVASTATIN CALCIUM 20 MG PO TABS
20.0000 mg | ORAL_TABLET | Freq: Every day | ORAL | Status: DC
  Administered 2015-04-22 – 2015-04-25 (×4): 20 mg via ORAL
  Filled 2015-04-22 (×4): qty 1

## 2015-04-22 MED ORDER — DEXTROSE 5 % IV SOLN
2.0000 g | Freq: Two times a day (BID) | INTRAVENOUS | Status: AC
  Administered 2015-04-23: 2 g via INTRAVENOUS
  Filled 2015-04-22 (×2): qty 2

## 2015-04-22 MED ORDER — BUPIVACAINE-EPINEPHRINE (PF) 0.25% -1:200000 IJ SOLN
INTRAMUSCULAR | Status: AC
  Filled 2015-04-22: qty 30

## 2015-04-22 MED ORDER — ONDANSETRON HCL 4 MG/2ML IJ SOLN
INTRAMUSCULAR | Status: AC
  Filled 2015-04-22: qty 2

## 2015-04-22 MED ORDER — CHLORPROMAZINE HCL 100 MG PO TABS
200.0000 mg | ORAL_TABLET | Freq: Every day | ORAL | Status: DC
  Administered 2015-04-22 – 2015-04-25 (×4): 200 mg via ORAL
  Filled 2015-04-22 (×8): qty 2

## 2015-04-22 MED ORDER — NEOSTIGMINE METHYLSULFATE 10 MG/10ML IV SOLN
INTRAVENOUS | Status: AC
  Filled 2015-04-22: qty 1

## 2015-04-22 MED ORDER — FENTANYL CITRATE (PF) 100 MCG/2ML IJ SOLN
INTRAMUSCULAR | Status: DC | PRN
  Administered 2015-04-22 (×5): 50 ug via INTRAVENOUS
  Administered 2015-04-22: 100 ug via INTRAVENOUS
  Administered 2015-04-22: 150 ug via INTRAVENOUS
  Administered 2015-04-22: 50 ug via INTRAVENOUS

## 2015-04-22 MED ORDER — SPIRONOLACTONE 50 MG PO TABS
50.0000 mg | ORAL_TABLET | Freq: Two times a day (BID) | ORAL | Status: DC
  Administered 2015-04-22 – 2015-04-26 (×8): 50 mg via ORAL
  Filled 2015-04-22 (×8): qty 1

## 2015-04-22 MED ORDER — LORAZEPAM 2 MG/ML IJ SOLN: 1.0000 mg | INTRAMUSCULAR | Status: DC | PRN

## 2015-04-22 MED ORDER — PROPOFOL 10 MG/ML IV BOLUS
INTRAVENOUS | Status: AC
  Filled 2015-04-22: qty 20

## 2015-04-22 MED ORDER — 0.9 % SODIUM CHLORIDE (POUR BTL) OPTIME
TOPICAL | Status: DC | PRN
  Administered 2015-04-22: 2000 mL
  Administered 2015-04-22 (×2): 1000 mL

## 2015-04-22 MED ORDER — HYDROMORPHONE HCL 1 MG/ML IJ SOLN
0.2500 mg | INTRAMUSCULAR | Status: DC | PRN
Start: 2015-04-22 — End: 2015-04-22
  Administered 2015-04-22 (×4): 0.5 mg via INTRAVENOUS

## 2015-04-22 MED ORDER — SUCCINYLCHOLINE CHLORIDE 20 MG/ML IJ SOLN
INTRAMUSCULAR | Status: AC
  Filled 2015-04-22: qty 1

## 2015-04-22 MED ORDER — LACTATED RINGERS IV SOLN
INTRAVENOUS | Status: DC | PRN
  Administered 2015-04-22 (×2): via INTRAVENOUS

## 2015-04-22 MED ORDER — SACCHAROMYCES BOULARDII 250 MG PO CAPS
250.0000 mg | ORAL_CAPSULE | Freq: Two times a day (BID) | ORAL | Status: DC
  Administered 2015-04-22 – 2015-04-26 (×8): 250 mg via ORAL
  Filled 2015-04-22 (×11): qty 1

## 2015-04-22 MED ORDER — LIDOCAINE HCL (CARDIAC) 20 MG/ML IV SOLN
INTRAVENOUS | Status: DC | PRN
  Administered 2015-04-22: 80 mg via INTRAVENOUS

## 2015-04-22 MED ORDER — HYDROMORPHONE HCL 1 MG/ML IJ SOLN
INTRAMUSCULAR | Status: AC
  Filled 2015-04-22: qty 1

## 2015-04-22 MED ORDER — BUPIVACAINE-EPINEPHRINE 0.25% -1:200000 IJ SOLN
INTRAMUSCULAR | Status: DC | PRN
  Administered 2015-04-22: 30 mL

## 2015-04-22 MED ORDER — HYDROMORPHONE HCL 1 MG/ML IJ SOLN
1.0000 mg | INTRAMUSCULAR | Status: DC | PRN
  Administered 2015-04-23 – 2015-04-26 (×10): 1 mg via INTRAVENOUS
  Filled 2015-04-22 (×10): qty 1

## 2015-04-22 MED ORDER — ALVIMOPAN 12 MG PO CAPS
12.0000 mg | ORAL_CAPSULE | Freq: Once | ORAL | Status: AC
  Administered 2015-04-22: 12 mg via ORAL
  Filled 2015-04-22: qty 1

## 2015-04-22 MED ORDER — ALVIMOPAN 12 MG PO CAPS
12.0000 mg | ORAL_CAPSULE | Freq: Two times a day (BID) | ORAL | Status: DC
  Administered 2015-04-23 (×2): 12 mg via ORAL
  Filled 2015-04-22 (×2): qty 1

## 2015-04-22 MED ORDER — ALUM & MAG HYDROXIDE-SIMETH 200-200-20 MG/5ML PO SUSP: 30.0000 mL | Freq: Four times a day (QID) | ORAL | Status: DC | PRN

## 2015-04-22 MED ORDER — LIDOCAINE HCL (CARDIAC) 20 MG/ML IV SOLN
INTRAVENOUS | Status: AC
  Filled 2015-04-22: qty 5

## 2015-04-22 MED ORDER — DEXAMETHASONE SODIUM PHOSPHATE 4 MG/ML IJ SOLN
INTRAMUSCULAR | Status: AC
  Filled 2015-04-22: qty 1

## 2015-04-22 MED ORDER — ERYTHROMYCIN BASE 250 MG PO TABS: 1000.0000 mg | ORAL_TABLET | ORAL | Status: DC

## 2015-04-22 MED ORDER — NALOXONE HCL 0.4 MG/ML IJ SOLN: 0.4000 mg | INTRAMUSCULAR | Status: DC | PRN

## 2015-04-22 MED ORDER — ENOXAPARIN SODIUM 40 MG/0.4ML ~~LOC~~ SOLN
40.0000 mg | SUBCUTANEOUS | Status: DC
  Administered 2015-04-23 – 2015-04-26 (×4): 40 mg via SUBCUTANEOUS
  Filled 2015-04-22 (×4): qty 0.4

## 2015-04-22 MED ORDER — OXYCODONE-ACETAMINOPHEN 5-325 MG PO TABS
1.0000 | ORAL_TABLET | ORAL | Status: DC | PRN
  Administered 2015-04-24 – 2015-04-25 (×3): 2 via ORAL
  Filled 2015-04-22 (×4): qty 2

## 2015-04-22 MED ORDER — INSULIN ASPART 100 UNIT/ML ~~LOC~~ SOLN
0.0000 [IU] | SUBCUTANEOUS | Status: DC
  Administered 2015-04-22: 2 [IU] via SUBCUTANEOUS
  Administered 2015-04-23: 3 [IU] via SUBCUTANEOUS
  Administered 2015-04-23 (×2): 2 [IU] via SUBCUTANEOUS
  Administered 2015-04-24: 3 [IU] via SUBCUTANEOUS
  Administered 2015-04-24: 2 [IU] via SUBCUTANEOUS

## 2015-04-22 MED ORDER — GLYCOPYRROLATE 0.2 MG/ML IJ SOLN
INTRAMUSCULAR | Status: AC
  Filled 2015-04-22: qty 3

## 2015-04-22 MED ORDER — NEOMYCIN SULFATE 500 MG PO TABS: 1000.0000 mg | ORAL_TABLET | ORAL | Status: DC

## 2015-04-22 MED ORDER — ROCURONIUM BROMIDE 100 MG/10ML IV SOLN
INTRAVENOUS | Status: DC | PRN
  Administered 2015-04-22: 50 mg via INTRAVENOUS
  Administered 2015-04-22: 10 mg via INTRAVENOUS
  Administered 2015-04-22: 5 mg via INTRAVENOUS
  Administered 2015-04-22: 10 mg via INTRAVENOUS

## 2015-04-22 MED ORDER — ONDANSETRON HCL 4 MG PO TABS: 4.0000 mg | ORAL_TABLET | Freq: Four times a day (QID) | ORAL | Status: DC | PRN

## 2015-04-22 MED ORDER — ROCURONIUM BROMIDE 50 MG/5ML IV SOLN
INTRAVENOUS | Status: AC
  Filled 2015-04-22: qty 1

## 2015-04-22 MED ORDER — KCL IN DEXTROSE-NACL 20-5-0.9 MEQ/L-%-% IV SOLN
INTRAVENOUS | Status: DC
  Administered 2015-04-22 – 2015-04-23 (×4): via INTRAVENOUS
  Administered 2015-04-25: 1 mL via INTRAVENOUS
  Administered 2015-04-25: 21:00:00 via INTRAVENOUS
  Filled 2015-04-22 (×10): qty 1000

## 2015-04-22 MED ORDER — OXYCODONE HCL 5 MG PO TABS: 5.0000 mg | ORAL_TABLET | Freq: Once | ORAL | Status: DC | PRN

## 2015-04-22 MED ORDER — GLYCOPYRROLATE 0.2 MG/ML IJ SOLN
INTRAMUSCULAR | Status: DC | PRN
  Administered 2015-04-22: 0.6 mg via INTRAVENOUS

## 2015-04-22 MED ORDER — DIPHENHYDRAMINE HCL 12.5 MG/5ML PO ELIX: 12.5000 mg | ORAL_SOLUTION | Freq: Four times a day (QID) | ORAL | Status: DC | PRN

## 2015-04-22 MED ORDER — HYDROMORPHONE 0.3 MG/ML IV SOLN
INTRAVENOUS | Status: DC
  Administered 2015-04-22: 0.3 mg via INTRAVENOUS
  Administered 2015-04-22: 0.6 mg via INTRAVENOUS
  Administered 2015-04-22: 13:00:00 via INTRAVENOUS
  Administered 2015-04-23: 3 mg via INTRAVENOUS
  Administered 2015-04-23: 1.8 mg via INTRAVENOUS
  Administered 2015-04-23: 08:00:00 via INTRAVENOUS
  Administered 2015-04-23: 1.2 mg via INTRAVENOUS
  Filled 2015-04-22: qty 25

## 2015-04-22 MED ORDER — VORTIOXETINE HBR 5 MG PO TABS
5.0000 mg | ORAL_TABLET | Freq: Every day | ORAL | Status: DC
  Administered 2015-04-23 – 2015-04-26 (×4): 5 mg via ORAL
  Filled 2015-04-22 (×4): qty 1

## 2015-04-22 MED ORDER — OXYCODONE HCL 5 MG/5ML PO SOLN: 5.0000 mg | Freq: Once | ORAL | Status: DC | PRN

## 2015-04-22 MED ORDER — NEOSTIGMINE METHYLSULFATE 10 MG/10ML IV SOLN
INTRAVENOUS | Status: DC | PRN
  Administered 2015-04-22: 4 mg via INTRAVENOUS

## 2015-04-22 MED ORDER — DIPHENHYDRAMINE HCL 50 MG/ML IJ SOLN: 12.5000 mg | Freq: Four times a day (QID) | INTRAMUSCULAR | Status: DC | PRN

## 2015-04-22 MED ORDER — SODIUM CHLORIDE 0.9 % IJ SOLN: 9.0000 mL | INTRAMUSCULAR | Status: DC | PRN

## 2015-04-22 MED ORDER — PEG 3350-KCL-NA BICARB-NACL 420 G PO SOLR: 4000.0000 mL | Freq: Once | ORAL | Status: DC

## 2015-04-22 MED ORDER — FUROSEMIDE 20 MG PO TABS
20.0000 mg | ORAL_TABLET | Freq: Every day | ORAL | Status: DC
  Administered 2015-04-23 – 2015-04-26 (×4): 20 mg via ORAL
  Filled 2015-04-22 (×4): qty 1

## 2015-04-22 MED ORDER — PHENYLEPHRINE HCL 10 MG/ML IJ SOLN
INTRAMUSCULAR | Status: DC | PRN
  Administered 2015-04-22 (×3): 80 ug via INTRAVENOUS

## 2015-04-22 MED ORDER — EPHEDRINE SULFATE 50 MG/ML IJ SOLN
INTRAMUSCULAR | Status: DC | PRN
  Administered 2015-04-22 (×3): 10 mg via INTRAVENOUS

## 2015-04-22 MED ORDER — ONDANSETRON HCL 4 MG/2ML IJ SOLN
INTRAMUSCULAR | Status: DC | PRN
  Administered 2015-04-22: 4 mg via INTRAVENOUS

## 2015-04-22 MED ORDER — SODIUM CHLORIDE 0.9 % IJ SOLN
INTRAMUSCULAR | Status: AC
  Filled 2015-04-22: qty 10

## 2015-04-22 SURGICAL SUPPLY — 83 items
APPLIER CLIP 5 13 M/L LIGAMAX5 (MISCELLANEOUS)
APR CLP MED LRG 5 ANG JAW (MISCELLANEOUS)
CANISTER SUCTION 2500CC (MISCELLANEOUS) ×2 IMPLANT
CELLS DAT CNTRL 66122 CELL SVR (MISCELLANEOUS) IMPLANT
CLIP APPLIE 5 13 M/L LIGAMAX5 (MISCELLANEOUS) IMPLANT
COVER MAYO STAND STRL (DRAPES) ×4 IMPLANT
COVER SURGICAL LIGHT HANDLE (MISCELLANEOUS) ×2 IMPLANT
DRAPE LAPAROSCOPIC ABDOMINAL (DRAPES) ×4 IMPLANT
DRAPE PROXIMA HALF (DRAPES) ×2 IMPLANT
DRAPE UTILITY XL STRL (DRAPES) ×10 IMPLANT
DRAPE WARM FLUID 44X44 (DRAPE) ×2 IMPLANT
DRSG OPSITE POSTOP 4X10 (GAUZE/BANDAGES/DRESSINGS) IMPLANT
DRSG OPSITE POSTOP 4X8 (GAUZE/BANDAGES/DRESSINGS) ×2 IMPLANT
DRSG TEGADERM 2-3/8X2-3/4 SM (GAUZE/BANDAGES/DRESSINGS) ×1 IMPLANT
ELECT BLADE 6.5 EXT (BLADE) ×2 IMPLANT
ELECT CAUTERY BLADE 6.4 (BLADE) ×4 IMPLANT
ELECT REM PT RETURN 9FT ADLT (ELECTROSURGICAL) ×2
ELECTRODE REM PT RTRN 9FT ADLT (ELECTROSURGICAL) ×1 IMPLANT
GEL ULTRASOUND 20GR AQUASONIC (MISCELLANEOUS) IMPLANT
GLOVE BIO SURGEON STRL SZ 6 (GLOVE) ×4 IMPLANT
GLOVE BIO SURGEON STRL SZ7 (GLOVE) ×6 IMPLANT
GLOVE BIO SURGEON STRL SZ8 (GLOVE) ×5 IMPLANT
GLOVE BIOGEL PI IND STRL 6.5 (GLOVE) IMPLANT
GLOVE BIOGEL PI IND STRL 7.0 (GLOVE) IMPLANT
GLOVE BIOGEL PI IND STRL 7.5 (GLOVE) IMPLANT
GLOVE BIOGEL PI IND STRL 8 (GLOVE) ×3 IMPLANT
GLOVE BIOGEL PI INDICATOR 6.5 (GLOVE) ×4
GLOVE BIOGEL PI INDICATOR 7.0 (GLOVE) ×1
GLOVE BIOGEL PI INDICATOR 7.5 (GLOVE) ×1
GLOVE BIOGEL PI INDICATOR 8 (GLOVE) ×3
GLOVE ECLIPSE 7.5 STRL STRAW (GLOVE) ×2 IMPLANT
GLOVE SURG SS PI 6.5 STRL IVOR (GLOVE) ×3 IMPLANT
GOWN STRL REUS W/ TWL LRG LVL3 (GOWN DISPOSABLE) ×9 IMPLANT
GOWN STRL REUS W/ TWL XL LVL3 (GOWN DISPOSABLE) ×2 IMPLANT
GOWN STRL REUS W/TWL 2XL LVL3 (GOWN DISPOSABLE) ×3 IMPLANT
GOWN STRL REUS W/TWL LRG LVL3 (GOWN DISPOSABLE) ×18
GOWN STRL REUS W/TWL XL LVL3 (GOWN DISPOSABLE) ×6
LEGGING LITHOTOMY PAIR STRL (DRAPES) ×2 IMPLANT
LIGASURE IMPACT 36 18CM CVD LR (INSTRUMENTS) ×1 IMPLANT
NS IRRIG 1000ML POUR BTL (IV SOLUTION) ×4 IMPLANT
PENCIL BUTTON HOLSTER BLD 10FT (ELECTRODE) ×4 IMPLANT
RELOAD PROXIMATE 75MM BLUE (ENDOMECHANICALS) ×4 IMPLANT
RELOAD STAPLE 75 3.8 BLU REG (ENDOMECHANICALS) IMPLANT
RETRACTOR WND ALEXIS 18 MED (MISCELLANEOUS) IMPLANT
RTRCTR WOUND ALEXIS 18CM MED (MISCELLANEOUS)
SCALPEL HARMONIC ACE (MISCELLANEOUS) ×2 IMPLANT
SCISSORS LAP 5X35 DISP (ENDOMECHANICALS) ×3 IMPLANT
SET IRRIG TUBING LAPAROSCOPIC (IRRIGATION / IRRIGATOR) IMPLANT
SLEEVE ENDOPATH XCEL 5M (ENDOMECHANICALS) ×4 IMPLANT
SPONGE GAUZE 4X4 12PLY STER LF (GAUZE/BANDAGES/DRESSINGS) ×1 IMPLANT
SPONGE LAP 18X18 X RAY DECT (DISPOSABLE) ×4 IMPLANT
STAPLER CIRC ILS CVD 33MM 37CM (STAPLE) ×1 IMPLANT
STAPLER CUT CVD 40MM BLUE (STAPLE) ×2 IMPLANT
STAPLER GUN LINEAR PROX 60 (STAPLE) ×1 IMPLANT
STAPLER PROXIMATE 75MM BLUE (STAPLE) ×4 IMPLANT
STAPLER VISISTAT 35W (STAPLE) ×2 IMPLANT
SUCTION POOLE TIP (SUCTIONS) ×2 IMPLANT
SUT PDS AB 1 CT  36 (SUTURE) ×2
SUT PDS AB 1 CT 36 (SUTURE) IMPLANT
SUT PROLENE 2 0 KS (SUTURE) ×1 IMPLANT
SUT PROLENE 2 0 SH DA (SUTURE) IMPLANT
SUT VIC AB 2-0 SH 18 (SUTURE) ×2 IMPLANT
SUT VIC AB 3-0 SH 18 (SUTURE) ×2 IMPLANT
SUT VIC AB 3-0 SH 27 (SUTURE) ×2
SUT VIC AB 3-0 SH 27XBRD (SUTURE) ×1 IMPLANT
SUT VICRYL AB 2 0 TIES (SUTURE) ×2 IMPLANT
SUT VICRYL AB 3 0 TIES (SUTURE) ×2 IMPLANT
SYR BULB IRRIGATION 50ML (SYRINGE) ×2 IMPLANT
SYS LAPSCP GELPORT 120MM (MISCELLANEOUS) ×2
SYSTEM LAPSCP GELPORT 120MM (MISCELLANEOUS) ×1 IMPLANT
TOWEL OR 17X26 10 PK STRL BLUE (TOWEL DISPOSABLE) ×4 IMPLANT
TRAY FOLEY CATH 16FRSI W/METER (SET/KITS/TRAYS/PACK) ×2 IMPLANT
TRAY FOLEY W/METER SILVER 16FR (SET/KITS/TRAYS/PACK) ×1 IMPLANT
TRAY LAPAROSCOPIC (CUSTOM PROCEDURE TRAY) ×2 IMPLANT
TRAY PROCTOSCOPIC FIBER OPTIC (SET/KITS/TRAYS/PACK) ×2 IMPLANT
TROCAR XCEL 12X100 BLDLESS (ENDOMECHANICALS) IMPLANT
TROCAR XCEL BLUNT TIP 100MML (ENDOMECHANICALS) IMPLANT
TROCAR XCEL NON-BLD 11X100MML (ENDOMECHANICALS) IMPLANT
TROCAR XCEL NON-BLD 5MMX100MML (ENDOMECHANICALS) ×2 IMPLANT
TUBE CONNECTING 12X1/4 (SUCTIONS) ×4 IMPLANT
TUBING FILTER THERMOFLATOR (ELECTROSURGICAL) ×2 IMPLANT
TUBING INSUFFLATION (TUBING) ×2 IMPLANT
YANKAUER SUCT BULB TIP NO VENT (SUCTIONS) ×4 IMPLANT

## 2015-04-22 NOTE — Interval H&P Note (Signed)
History and Physical Interval Note:  04/22/2015 7:24 AM  [name]  has presented today for surgery, with the diagnosis of Colon Cancer  The various methods of treatment have been discussed with the patient and family. After consideration of risks, benefits and other options for treatment, the patient has consented to  Procedure(s): LAPAROSCOPIC SIGMOID COLECTOMY (N/A) as a surgical intervention .  The patient's history has been reviewed, patient examined, no change in status, stable for surgery.  I have reviewed the patient's chart and labs.  Questions were answered to the patient's satisfaction.     [name] A.

## 2015-04-22 NOTE — Anesthesia Procedure Notes (Signed)
Procedure Name: Intubation Date/Time: 04/22/2015 8:43 AM Performed by: Gershon Mussel, Yurem Viner Pre-anesthesia Checklist: Patient identified, Patient being monitored, Timeout performed, Emergency Drugs available and Suction available Patient Re-evaluated:Patient Re-evaluated prior to inductionOxygen Delivery Method: Circle System Utilized Preoxygenation: Pre-oxygenation with 100% oxygen Intubation Type: IV induction Ventilation: Mask ventilation without difficulty Laryngoscope Size: Miller and 2 Grade View: Grade I Tube type: Oral Tube size: 7.0 mm Number of attempts: 1 Airway Equipment and Method: Stylet Placement Confirmation: ETT inserted through vocal cords under direct vision,  positive ETCO2 and breath sounds checked- equal and bilateral Secured at: 21 cm Tube secured with: Tape Dental Injury: Teeth and Oropharynx as per pre-operative assessment

## 2015-04-22 NOTE — Anesthesia Postprocedure Evaluation (Signed)
  Anesthesia Post-op Note  Patient: [name]  Procedure(s) Performed: Procedure(s): LAPAROSCOPIC HAND ASSISTED SIGMOID COLECTOMY (N/A)  Patient Location: PACU  Anesthesia Type:General  Level of Consciousness: awake  Airway and Oxygen Therapy: Patient Spontanous Breathing  Post-op Pain: moderate  Post-op Assessment: Post-op Vital signs reviewed, Patient's Cardiovascular Status Stable, Respiratory Function Stable, Patent Airway, No signs of Nausea or vomiting and Pain level controlled  Post-op Vital Signs: Reviewed and stable  Last Vitals:  Filed Vitals:   04/22/15 1415  BP:   Pulse: 103  Temp:   Resp: 13    Complications: No apparent anesthesia complications

## 2015-04-22 NOTE — H&P (Signed)
H&P   [name] (MR# [id number])      H&P Info    Author Note Status Last Update User Last Update Date/Time   [name] Luna, [name] Signed [name] Luna, [name] 03/09/2015 1:44 PM    H&P    Expand All Collapse All   [name] Mar 03/09/2015 8:57 AM Location: [facility] Patient #: [id number] DOB: 05-25-1966 Divorced / Language: [name] / Race: White Female History of Present Illness [name]; 03/09/2015 1:38 PM) Patient words: colon cancer.  The patient is a 49 year old female who presents with colon cancer. The patient is being seen for initial consultation for The patient is being seen for initial consultation for unstaged adenocarcinoma of the sigmoid colon.. The patient was referred by a gastroenterologist. Initial presentation was 1 month(s) ago for constipation.. Current diagnosis was determined by colonoscopy and abdominal CT.[name] Past evaluation has included CEA, complete blood count, liver function tests, alkaline phosphatase, abdomen CT, pelvic CT and gastroenterology consultation.. This problem has not been previously treated.. Symptoms include constipation.. Other Problems [name]; 03/09/2015 9:01 AM) Anxiety Disorder Arthritis Back Pain Bladder Problems Cerebrovascular Accident Chronic Obstructive Lung Disease Colon Cancer Depression Diabetes Mellitus Gastroesophageal Reflux Disease High blood pressure Hypercholesterolemia Sleep Apnea Transfusion history  Past Surgical History [name]; 03/09/2015 9:01 AM) Cesarean Section - 1 Colon Polyp Removal - Colonoscopy Foot Surgery Bilateral. Hysterectomy (not due to cancer) - Partial Knee Surgery Left. Oral Surgery Resection of Stomach Shoulder Surgery Bilateral. Spinal Surgery - Lower Back Spinal Surgery - Neck  Diagnostic Studies History [name]; 03/09/2015 9:01 AM) Colonoscopy within last year Pap Smear 1-5 years  ago  Allergies [name]; 03/09/2015 9:04 AM) TraZODone HCl *ANTIDEPRESSANTS* hallucinations Ibuprofen *ANALGESICS - ANTI-INFLAMMATORY* Hives. Sweetening Enhancer *PHARMACEUTICAL ADJUVANTS* Triazolam *HYPNOTICS/SEDATIVES/SLEEP DISORDER AGENTS* Tetracycline *CHEMICALS* Latex  Medication History [name]; 03/09/2015 9:09 AM) ALPRAZolam ER (1MG  Tablet ER 24HR, Oral) Active. Lipitor (40MG  Tablet, Oral) Active. Brintellix (20MG  Tablet, Oral) Active. Invokana (100MG  Tablet, Oral) Active. ChlorproMAZINE HCl (200MG  Tablet, Oral) Active. Diazepam (10MG  Tablet, Oral) Active. NexIUM (40MG  Capsule DR, Oral) Active. Klor-Con M20 [facility] Tablet ER, Oral) Active. Oxycodone-Acetaminophen (10-325MG  Tablet, Oral) Active. OxyCODONE HCl (5MG  Tablet, Oral) Active. Rapaflo (8MG  Capsule, Oral) Active. Ramipril (5MG  Capsule, Oral) Active. Spironolactone (25MG  Tablet, Oral) Active. TiZANidine HCl (4MG  Tablet, Oral) Active. Medications Reconciled  Social History [name]; 03/09/2015 9:01 AM) Alcohol use Occasional alcohol use. Caffeine use Carbonated beverages, Tea. No drug use Tobacco use Current every day smoker.  Family History [name]; 03/09/2015 9:01 AM) Alcohol Abuse Family Members In General. Arthritis Father, Mother. Cancer Father, Mother. Diabetes Mellitus Brother, Mother. Heart Disease Father. Heart disease in female family member before age 55 Hypertension Father. Respiratory Condition Father.  Pregnancy / Birth History [name]; 03/09/2015 9:01 AM) Age at menarche 14 years. Maternal age 35-25 Para 2     Review of Systems ([name]; 03/09/2015 9:01 AM) General Present- Fatigue and Weight Gain. Not Present- Appetite Loss, Chills, Fever, Night Sweats and Weight Loss. Skin Present- Dryness. Not Present- Change in Wart/Mole, Hives, Jaundice, New Lesions, Non-Healing  Wounds, Rash and Ulcer. HEENT Present- Wears glasses/contact lenses. Not Present- Earache, Hearing Loss, Hoarseness, Nose Bleed, Oral Ulcers, Ringing in the Ears, Seasonal Allergies, Sinus Pain, Sore Throat, Visual Disturbances and Yellow Eyes. Respiratory Present- Chronic Cough and Snoring. Not Present- Bloody sputum, Difficulty Breathing and Wheezing. Cardiovascular Present- Shortness  of Breath and Swelling of Extremities. Not Present- Chest Pain, Difficulty Breathing Lying Down, Leg Cramps, Palpitations and Rapid Heart Rate. Gastrointestinal Present- Bloating, Change in Bowel Habits and Constipation. Not Present- Abdominal Pain, Bloody Stool, Chronic diarrhea, Difficulty Swallowing, Excessive gas, Gets full quickly at meals, Hemorrhoids, Indigestion, Nausea, Rectal Pain and Vomiting. Musculoskeletal Present- Back Pain, Joint Pain and Muscle Weakness. Not Present- Joint Stiffness, Muscle Pain and Swelling of Extremities. Neurological Present- Decreased Memory, Numbness, Tingling, Tremor, Trouble walking and Weakness. Not Present- Fainting, Headaches and Seizures. Psychiatric Present- Anxiety, Bipolar and Depression. Not Present- Change in Sleep Pattern, Fearful and Frequent crying. Endocrine Present- Cold Intolerance, Hair Changes and Heat Intolerance. Not Present- Excessive Hunger, Hot flashes and New Diabetes.  Vitals [name]; 03/09/2015 9:01 AM) 03/09/2015 9:00 AM Weight: 195 lb Height: 65in Body Surface Area: 2.01 m Body Mass Index: 32.45 kg/m BP: 120/80 (Sitting, Left Arm, Standard)     Physical Exam ([name]; 03/09/2015 1:40 PM)  General Note: sleepy looking slow affect   Head and Neck Head-normocephalic, atraumatic with no lesions or palpable masses. Trachea-midline. Thyroid Gland Characteristics - normal size and consistency.  Chest and Lung Exam Chest and lung exam reveals -quiet, even and easy respiratory effort with no use of  accessory muscles and on auscultation, normal breath sounds, no adventitious sounds and normal vocal resonance. Inspection Chest Wall - Normal. Back - normal.  Cardiovascular Cardiovascular examination reveals -normal heart sounds, regular rate and rhythm with no murmurs and normal pedal pulses bilaterally.  Abdomen Inspection Inspection of the abdomen reveals - No Hernias. Skin - Scar - no surgical scars. Palpation/Percussion Palpation and Percussion of the abdomen reveal - Soft, Non Tender, No Rebound tenderness, No Rigidity (guarding) and No hepatosplenomegaly. Auscultation Auscultation of the abdomen reveals - Bowel sounds normal.  Neuropsychiatric The patient's mood and affect are described as -apathetic and hypomania. Associations-circumstantial. Judgment and Insight-insight is appropriate concerning matters relevant to self. Thought Processes/Cognitive Function-concentration impaired.    Assessment & Plan (Ashanty Coltrane A. Jayni Prescher [name]; 03/09/2015 1:44 PM)  COLON CANCER (153.9  C18.9) Impression: Risk of laparoscopic partial colon resection include bleeding, infection, leak of anastamosis, death, colostomy, organ injury, kidney injury, ureter injury, bladder injury, SBO, wound complications,  and need for other surgery. Pt agrees to proceed.  increased risk of bleeding infection, anastamotic leak, death, DVT, CVA MI AND need for other procedures. Recommended smoking cessation. Pt overall health is poor and unfortunately no other option for this problem but surgical resection.   Long discussion of overall health and need to take better care of herself.    will need cardiac clearance.  Current Plans Pt Education - [facility] Colon Bowel Prep 2015 Miralax/Antibiotics Started Neomycin Sulfate 500MG , 2 (two) Tablet SEE NOTE, #6, 03/09/2015, No Refill. Local Order: TAKE TWO TABLETS AT 2 PM, 3 PM, AND 10 PM THE DAY PRIOR TO SURGERY Started Flagyl 500MG , 2 (two) Tablet SEE NOTE, #6,  03/09/2015, No Refill. Local Order: Take at 2pm, 3pm, and 10pm the day prior to your colon operation Pt Education - [facility] Colorectal Cancer (AT) Pt Education - [facility] Constipation (AT) Pt Education - [facility] Abdominal Surgery (Gross) Pt Education - [facility] Laparosopic Post Op HCI (Gross) Pt Education - [facility] Wound Care Instructions (Gross): discussed with patient and provided information.

## 2015-04-22 NOTE — Anesthesia Preprocedure Evaluation (Addendum)
Anesthesia Evaluation  Patient identified by MRN, date of birth, ID band Patient awake    Reviewed: Allergy & Precautions, NPO status , Patient's Chart, lab work & pertinent test results  History of Anesthesia Complications Negative for: history of anesthetic complications  Airway Mallampati: II  TM Distance: >3 FB Neck ROM: Full    Dental  (+) Teeth Intact   Pulmonary shortness of breath, sleep apnea , COPDCurrent Smoker,  breath sounds clear to auscultation        Cardiovascular hypertension, Pt. on medications +CHF Rhythm:Regular     Neuro/Psych  Headaches, Anxiety Depression Bipolar Disorder  Neuromuscular disease CVA, No Residual Symptoms    GI/Hepatic Neg liver ROS, GERD-  Medicated and Controlled,  Endo/Other  diabetes, Type 2, Insulin DependentMorbid obesity  Renal/GU Renal InsufficiencyRenal disease     Musculoskeletal  (+) Arthritis -,   Abdominal   Peds  Hematology   Anesthesia Other Findings   Reproductive/Obstetrics                            Anesthesia Physical Anesthesia Plan  ASA: III  Anesthesia Plan: General   Post-op Pain Management:    Induction: Intravenous  Airway Management Planned: Oral ETT  Additional Equipment: None  Intra-op Plan:   Post-operative Plan: Extubation in OR  Informed Consent: I have reviewed the patients History and Physical, chart, labs and discussed the procedure including the risks, benefits and alternatives for the proposed anesthesia with the patient or authorized representative who has indicated his/her understanding and acceptance.   Dental advisory given  Plan Discussed with: CRNA and Surgeon  Anesthesia Plan Comments:         Anesthesia Quick Evaluation

## 2015-04-22 NOTE — Brief Op Note (Signed)
04/22/2015  11:50 AM  PATIENT:  [name]  49 y.o. female  PRE-OPERATIVE DIAGNOSIS:  Colon Cancer  POST-OPERATIVE DIAGNOSIS:  Colon Cancer  PROCEDURE:  Procedure(s): LAPAROSCOPIC ASSISTED SIGMOID COLECTOMY (N/A) Mobilization of splenic flexure Rigid sigmoidoscopy   SURGEON:  Surgeon(s) and Role:    * Erroll Luna, MD - Primary    * Stark Klein, MD - Assisting      ANESTHESIA:   local and general  EBL:  Total I/O In: 1000 [I.V.:1000] Out: 650 [Urine:650]  BLOOD ADMINISTERED:none  DRAINS: none   LOCAL MEDICATIONS USED:  BUPIVICAINE   SPECIMEN:  Source of Specimen:  sigmoid colon   DISPOSITION OF SPECIMEN:  PATHOLOGY  COUNTS:  YES  TOURNIQUET:  * No tourniquets in log *  DICTATION: .Other Dictation: Dictation Number   [id number]  PLAN OF CARE: Admit to inpatient   PATIENT DISPOSITION:  PACU - hemodynamically stable.   Delay start of Pharmacological VTE agent (>24hrs) due to surgical blood loss or risk of bleeding: no

## 2015-04-22 NOTE — Transfer of Care (Signed)
Immediate Anesthesia Transfer of Care Note  Patient: [name]  Procedure(s) Performed: Procedure(s): LAPAROSCOPIC HAND ASSISTED SIGMOID COLECTOMY (N/A)  Patient Location: PACU  Anesthesia Type:General  Level of Consciousness: awake, patient cooperative and responds to stimulation  Airway & Oxygen Therapy: Patient Spontanous Breathing and Patient connected to face mask oxygen  Post-op Assessment: Report given to RN, Post -op Vital signs reviewed and stable and Patient moving all extremities X 4  Post vital signs: Reviewed and stable  Last Vitals:  Filed Vitals:   04/22/15 0646  BP: 146/100  Pulse: 87  Temp: 36.6 C  Resp: 20    Complications: No apparent anesthesia complications

## 2015-04-23 ENCOUNTER — Inpatient Hospital Stay (HOSPITAL_COMMUNITY): Payer: Medicare Other

## 2015-04-23 LAB — CBC
HCT: 37.1 % (ref 36.0–46.0)
Hemoglobin: 12.1 g/dL (ref 12.0–15.0)
MCH: 29.4 pg (ref 26.0–34.0)
MCHC: 32.6 g/dL (ref 30.0–36.0)
MCV: 90.3 fL (ref 78.0–100.0)
Platelets: 231 10*3/uL (ref 150–400)
RBC: 4.11 MIL/uL (ref 3.87–5.11)
RDW: 13.3 % (ref 11.5–15.5)
WBC: 15.3 10*3/uL — AB (ref 4.0–10.5)

## 2015-04-23 LAB — BASIC METABOLIC PANEL
Anion gap: 7 (ref 5–15)
CALCIUM: 8.2 mg/dL — AB (ref 8.4–10.5)
CO2: 27 mmol/L (ref 19–32)
CREATININE: 0.92 mg/dL (ref 0.50–1.10)
Chloride: 103 mmol/L (ref 96–112)
GFR calc Af Amer: 83 mL/min — ABNORMAL LOW (ref >90)
GFR calc non Af Amer: 72 mL/min — ABNORMAL LOW (ref >90)
GLUCOSE: 101 mg/dL — AB (ref 70–99)
Potassium: 3.7 mmol/L (ref 3.5–5.1)
SODIUM: 137 mmol/L (ref 135–145)

## 2015-04-23 LAB — GLUCOSE, CAPILLARY
GLUCOSE-CAPILLARY: 91 mg/dL (ref 70–99)
Glucose-Capillary: 123 mg/dL — ABNORMAL HIGH (ref 70–99)
Glucose-Capillary: 133 mg/dL — ABNORMAL HIGH (ref 70–99)
Glucose-Capillary: 141 mg/dL — ABNORMAL HIGH (ref 70–99)
Glucose-Capillary: 154 mg/dL — ABNORMAL HIGH (ref 70–99)
Glucose-Capillary: 96 mg/dL (ref 70–99)

## 2015-04-23 MED ORDER — DEXTROSE 50 % IV SOLN
INTRAVENOUS | Status: AC
  Administered 2015-04-23
  Filled 2015-04-23: qty 50

## 2015-04-23 NOTE — Progress Notes (Signed)
1 Day Post-Op  Subjective: PT SORE  NO FLATUS AWAKE AND ALERT   Objective: Vital signs in last 24 hours: Temp:  [98.4 F (36.9 C)-100.1 F (37.8 C)] 99.5 F (37.5 C) (04/28 0620) Pulse Rate:  [94-113] 102 (04/28 0620) Resp:  [8-18] 13 (04/28 0620) BP: (98-130)/(46-69) 118/65 mmHg (04/28 0620) SpO2:  [96 %-100 %] 99 % (04/28 0620) Last BM Date: 04/22/15  Intake/Output from previous day: 04/27 0701 - 04/28 0700 In: 2940 [I.V.:2940] Out: 1925 [Urine:1800; Blood:125] Intake/Output this shift:    Incision/Wound:CDI SOFT QUIET ABDOMEN LUNGS CTA CV RRR  Lab Results:   Recent Labs  04/20/15 1018 04/23/15 0430  WBC 11.1* 15.3*  HGB 12.6 12.1  HCT 38.4 37.1  PLT 244 231   BMET  Recent Labs  04/23/15 0430  NA 137  K 3.7  CL 103  CO2 27  GLUCOSE 101*  BUN <5*  CREATININE 0.92  CALCIUM 8.2*   PT/INR No results for input(s): LABPROT, INR in the last 72 hours. ABG No results for input(s): PHART, HCO3 in the last 72 hours.  Invalid input(s): PCO2, PO2  Studies/Results: No results found.  Anti-infectives: Anti-infectives    Start     Dose/Rate Route Frequency Ordered Stop   04/22/15 2200  cefoTEtan (CEFOTAN) 2 g in dextrose 5 % 50 mL IVPB     2 g 100 mL/hr over 30 Minutes Intravenous Every 12 hours 04/22/15 1610 04/23/15 0112   04/22/15 0800  cefoTEtan (CEFOTAN) 2 g in dextrose 5 % 50 mL IVPB     2 g 100 mL/hr over 30 Minutes Intravenous To Surgery 04/21/15 1434 04/22/15 0846   04/22/15 0630  neomycin (MYCIFRADIN) tablet 1,000 mg  Status:  Discontinued     1,000 mg Oral 3 times per day 04/22/15 0630 04/22/15 0704   04/22/15 0630  erythromycin (E-MYCIN) tablet 1,000 mg  Status:  Discontinued     1,000 mg Oral 3 times per day 04/22/15 0630 04/22/15 0704      Assessment/Plan: s/p Procedure(s): LAPAROSCOPIC HAND ASSISTED SIGMOID COLECTOMY (N/A) OOB CLEARS EXPLAINED THE IMPORTANCE OF HER GETTING OOB AND MOVING.   LOS: 1 day    Samik Balkcom  A. 04/23/2015

## 2015-04-23 NOTE — Op Note (Signed)
NAMEJONIYA, [name]                ACCOUNT NO.:  [id number]  MEDICAL RECORD NO.:  [id number]  LOCATION:  6N12C                        FACILITY:  [facility]  PHYSICIAN:  [name] A. Liadan Guizar, M.D.DATE OF BIRTH:  06-26-1966  DATE OF PROCEDURE:  04/22/2015 DATE OF DISCHARGE:                              OPERATIVE REPORT   PREOPERATIVE DIAGNOSIS:  Sigmoid colon cancer.  POSTOPERATIVE DIAGNOSIS:  Sigmoid colon cancer.  PROCEDURE: 1. Laparoscopic assisted sigmoid colectomy. 2. mobilization of the splenic flexure. 3. Rigid sigmoidoscopy.  SURGEON:  [name] A. Morrie Daywalt, MD  ASSISTANT:  Stark Klein, MD  ANESTHESIA:  General endotracheal anesthesia.  EBL:  100 mL.  SPECIMEN: 1. Sigmoid colon, submitted in 2 separate segments. 2. Anastomotic donuts to Pathology.  DRAINS:  None.  IV FLUIDS:  1.2 L of crystalloid.  INDICATIONS FOR PROCEDURE:  The patient is a 49 year old female who underwent a colonoscopy for constipation.  She was found to have a lesion in her sigmoid colon that was biopsied and found to be adenocarcinoma.  CT scan workup did not show any metastatic disease. She was seen in the office and evaluated.  The lesion was at about 30 cm according to her colonoscopy report.  We talked about surgical removal. We did discuss other options of nonsurgical treatment which are none. We discussed the need for further therapy, once the specimen was removed depending on pathological findings.  We discussed the operation as well as potential complications.  The procedure was inclusive and exclusive of bleeding, infection, death, DVT, need for colostomy, wound complications, hernia complications, anastomotic leak leading to sepsis, organ failure, organ injury, injury to the ureters, injury to bladder, injury to the kidneys, injury to other pelvic organs including the vaginal cuff, pelvic abscess, and then exacerbation of her multiple underlying medical problems, which include her previous  history of stroke, cardiovascular disease as well as abnormal heart rhythm and obstructive sleep apnea.  After discussion of all of the above, the potential risks with her and her family, she wished to proceed.  DESCRIPTION OF PROCEDURE:  The patient was met in the holding area. Questions were answered.  She was taken back to the operating room and placed supine on the OR table.  After induction of general endotracheal anesthesia, both arms were tucked and padded and she was placed in lithotomy with appropriate padding with no undue stress on any of her joints.  Her abdomen and perineum were prepped and draped in a sterile fashion after placing a Foley under sterile conditions.  Time-out was done to verify proper patient and procedure and she received 1 g of Cefotetan.  A 5-mm Optiview port was placed in the patient's right lower quadrant after making a small incision and advancing the trocar under direct vision into the abdominal cavity without injury. Pneumoperitoneum was created to 15 mmHg of CO2 and a laparoscope was placed.  Two other 5 mm ports were placed in the right mid and right upper quadrant of her abdomen, all under direct vision without injury. A left-sided port was placed as well with no evidence of injury.  There were some omental adhesions and these were taken down from the anterior abdominal wall from  previous hysterectomy.  I did not see any signs of carcinomatosis.  The liver, gallbladder and stomach were grossly normal. The ascending transverse and descending colon appeared grossly normal. I then examined and saw an area of very faint tattoo standing in the pelvis.  This was difficult to tell for sure if this is tattoo or just natural color of the tissue.  I then mobilized the left colon and splenic flexure using the Harmonic scalpel to provide mobilization.  In the proximal sigmoid colon,there was an extrinsic hard mass and I was unsure if it was tumor or not.  We  went ahead and mobilized the left colon and sigmoid colon along the white line of Toldt being careful not to injure the ureter which was not in the operative field and we stayed well away from the left ureter.  Left ovary was seen easily and was atrophic.  We stayed away from that and mobilized colon down to the proximal rectum.  The tattooing was at the proximal rectum which was approximately 20 cm from anal verge it seemed like.  I then through a lower midline incision placed a hand port using the GelPort technology and a 7 cm incision.  I was able to place my hand and run the colon and could not easily fill the tumor except this hard area which was in the proximal sigmoid colon.  I had concerns and I pulled out the colonoscopy report and the lesion was at 30 cm, but was not a large lesion, but was tattooed.  Examined the colon and felt the colon of the tattoo was but could not feel an obvious abnormality.  So after mobilizing the sigmoid, I went ahead and pulled out the hard area in the proximal sigmoid colon to examine through the port.  This felt hard, but I felt that this  needed to be resected and went ahead and did a small segmental resection of this using a GIA-75 stapling device with 2 firings, both proximal and distal to the area and the LigaSure was used to take the mesentery.  This was taken to the back table, opened that, I did not see any evidence of mucosal based lesion.  I placed my hand back and went more distal along the sigmoid colon toward the tattoo  and in the mesenteric side I could feel a small abnormality.  This corresponded to the tattoo area and I felt this corresponded to the colonoscopy report.  We went ahead and took the remainder of the sigmoid colon out down to the junction of the rectum with the LigaSure.  Care was taken to stay well away from the left ureter and right ureter. Everything was done under direct vision.  Once I got distal to the tattoo spot  by about 4-5 cm, I went ahead and used a contour stapler and made a small mesenteric defect, placed it distal to the tattooed area and fired it to divide and staple the colon off.  The specimen was removed and then opened on the back table again.  Mucosal based lesion was identified and marked.  This was sent for frozen section evaluation and the pathologist given its small size took a small piece of this and found high-grade dysplasia within this area but  was afraid to freeze  the entire specimen for fear of destroying it given that by comparison to the old pathology and the old colonoscopy report and the fact there was tattoo ink in the lesion that this indeed  was the lesion that was tattooed by the gastroenterologist and biopsied.  I did not feel any other abnormalities proximal or distal to this that would explain the findings and this was a tattooed area and the findings coincided at least to me with a colonoscopy report.  We then created this and did an anastomosis.  The proximal end pursestring device was placed across this and a 2-0 Prolene was placed  across the staple line.  Staple line was removed and EEA sizers were used, and a 33 EEA sizer fit nicely and that anvil was placed with the purse-string suture closed around it.  Second pursestring suture of Vicryl was placed around the anvil as well.  I went below and did a rigid proctoscopy/sigmoidoscopy using the rigid sigmoidoscope.  The rectum appeared normal and the colon up to the staple line which I could see which was at about 17-18 cm appeared intact with air insufflation without  Leakage and  I saw no other colon cancer lesion.  I removed the scope.  I then passed the dilator and stapler  easily per rectum as my partner held the anvil in place.  I then deployed my spike after removing the dilator under direct vision and then the spike and anvil were connected and closed down without any undue tension whatsoever.  This was  then fired and the stapling device was removed.  I repeated the rigid endoscopy per rectum, insufflated air and clamped the proximal bowel proximal to the  anastomosis and put water over this and saw no evidence of leak.  There was no bleeding from the anastomosis and I could see that it was intact.  I had 2 complete donuts, both proximal and distal.  Upon examination, these were sent to Pathology.  The irrigation was suctioned out.  The colon protocol was followed and we changed gowns and gloves, and covered all old drapes with new fresh drapes.  I removed all my ports since using the retractor, I saw no evidence of bleeding in the pelvis or any other abnormality.  All sponges that were used during the case were removed, if they were used for packing and counted found to be correct.  The wound was then closed with a fascial stitch #1 PDS and the subcu tissue was irrigated and skin closed with staples.  We then stapled the port sites closed after removing of the ports.  A dry dressing was applied over the incision and small 4x4s were applied to the other laparoscopic sites.  She was taken down at a lithotomy and placed supine.  She was then extubated and they put on her PACU bed.  She was stable.  All final counts were found to be correct.  She was taken to the recovery room in satisfactory condition.     Shon Indelicato A. Alto Gandolfo, M.D.     TAC/MEDQ  D:  04/22/2015  T:  04/23/2015  Job:  [id number]

## 2015-04-23 NOTE — Plan of Care (Signed)
Problem: Phase II Progression Outcomes Goal: Progressing with IS, TCDB Outcome: Progressing Pt able to IS up to 2000 Goal: Return of bowel function (flatus, BM) IF ABDOMINAL SURGERY:  Outcome: Progressing Pt had a bowel movement today

## 2015-04-23 NOTE — Progress Notes (Signed)
Hypoglycemic Event  CBG: 67  Treatment: D50 IV 25 mL  Symptoms: None  Follow-up CBG: Time0030 CBG Result:123  Possible Reasons for Event: medication regime and inadequate meal intake  Comments/MD notified:no    [name], [name]  Remember to initiate Hypoglycemia Order Set & complete

## 2015-04-23 NOTE — Progress Notes (Signed)
Patient up to bathroom, but unable to void.  Bladder scan performed- showed 999cc. Dr. Brantley Stage informed. I&O cath order obtained.

## 2015-04-24 ENCOUNTER — Encounter (HOSPITAL_COMMUNITY): Payer: Self-pay | Admitting: Surgery

## 2015-04-24 LAB — URINALYSIS, ROUTINE W REFLEX MICROSCOPIC
Bilirubin Urine: NEGATIVE
GLUCOSE, UA: NEGATIVE mg/dL
HGB URINE DIPSTICK: NEGATIVE
Ketones, ur: NEGATIVE mg/dL
Nitrite: NEGATIVE
PH: 5.5 (ref 5.0–8.0)
Protein, ur: NEGATIVE mg/dL
SPECIFIC GRAVITY, URINE: 1.011 (ref 1.005–1.030)
UROBILINOGEN UA: 0.2 mg/dL (ref 0.0–1.0)

## 2015-04-24 LAB — GLUCOSE, CAPILLARY
GLUCOSE-CAPILLARY: 137 mg/dL — AB (ref 70–99)
GLUCOSE-CAPILLARY: 168 mg/dL — AB (ref 70–99)
Glucose-Capillary: 100 mg/dL — ABNORMAL HIGH (ref 70–99)
Glucose-Capillary: 159 mg/dL — ABNORMAL HIGH (ref 70–99)
Glucose-Capillary: 103 mg/dL — ABNORMAL HIGH (ref 70–99)
Glucose-Capillary: 147 mg/dL — ABNORMAL HIGH (ref 70–99)

## 2015-04-24 LAB — BASIC METABOLIC PANEL
Anion gap: 8 (ref 5–15)
CALCIUM: 8.3 mg/dL — AB (ref 8.4–10.5)
CHLORIDE: 103 mmol/L (ref 96–112)
CO2: 25 mmol/L (ref 19–32)
Creatinine, Ser: 0.91 mg/dL (ref 0.50–1.10)
GFR calc non Af Amer: 73 mL/min — ABNORMAL LOW (ref >90)
GFR, EST AFRICAN AMERICAN: 84 mL/min — AB (ref >90)
GLUCOSE: 228 mg/dL — AB (ref 70–99)
POTASSIUM: 3.8 mmol/L (ref 3.5–5.1)
Sodium: 136 mmol/L (ref 135–145)

## 2015-04-24 LAB — URINE MICROSCOPIC-ADD ON

## 2015-04-24 MED ORDER — DIAZEPAM 5 MG PO TABS
10.0000 mg | ORAL_TABLET | Freq: Two times a day (BID) | ORAL | Status: DC | PRN
  Administered 2015-04-24: 10 mg via ORAL
  Filled 2015-04-24: qty 2

## 2015-04-24 MED ORDER — INSULIN ASPART 100 UNIT/ML ~~LOC~~ SOLN
0.0000 [IU] | Freq: Three times a day (TID) | SUBCUTANEOUS | Status: DC
  Administered 2015-04-24: 3 [IU] via SUBCUTANEOUS
  Administered 2015-04-25: 2 [IU] via SUBCUTANEOUS
  Administered 2015-04-25: 3 [IU] via SUBCUTANEOUS
  Administered 2015-04-25: 2 [IU] via SUBCUTANEOUS

## 2015-04-24 MED ORDER — TIZANIDINE HCL 2 MG PO TABS
2.0000 mg | ORAL_TABLET | Freq: Every day | ORAL | Status: DC
  Administered 2015-04-24 – 2015-04-25 (×2): 2 mg via ORAL
  Filled 2015-04-24 (×2): qty 1

## 2015-04-24 MED ORDER — INSULIN ASPART 100 UNIT/ML ~~LOC~~ SOLN
0.0000 [IU] | Freq: Every day | SUBCUTANEOUS | Status: DC
Start: 2015-04-24 — End: 2015-04-26

## 2015-04-24 NOTE — Progress Notes (Signed)
2 Days Post-Op  Subjective: Pt sleepy but arousable told her path report  Some urinary retention overnight  Low grade fevers but wound lungs and urine clean    Objective: Vital signs in last 24 hours: Temp:  [98.9 F (37.2 C)-100.8 F (38.2 C)] 98.9 F (37.2 C) (04/29 0523) Pulse Rate:  [88-102] 99 (04/29 0523) Resp:  [10-19] 19 (04/29 0523) BP: (96-153)/(56-82) 111/59 mmHg (04/29 0523) SpO2:  [92 %-99 %] 94 % (04/29 0523) Last BM Date: 04/22/15  Intake/Output from previous day: 04/28 0701 - 04/29 0700 In: 3118.7 [P.O.:1302; I.V.:1816.7] Out: 1125 [Urine:1125] Intake/Output this shift: Total I/O In: 816.7 [I.V.:816.7] Out: 1125 [Urine:1125]  Resp: clear to auscultation bilaterally Cardio: regular rate and rhythm, S1, S2 normal, no murmur, click, rub or gallop Incision/Wound:clean dry dressing in place.  Soft no rebound or guarding   Very flat affect  Apathy   Lab Results:   Recent Labs  04/23/15 0430  WBC 15.3*  HGB 12.1  HCT 37.1  PLT 231   BMET  Recent Labs  04/23/15 0430  NA 137  K 3.7  CL 103  CO2 27  GLUCOSE 101*  BUN <5*  CREATININE 0.92  CALCIUM 8.2*   PT/INR No results for input(s): LABPROT, INR in the last 72 hours. ABG No results for input(s): PHART, HCO3 in the last 72 hours.  Invalid input(s): PCO2, PO2  Studies/Results: Dg Chest Port 1 View  04/23/2015   CLINICAL DATA:  Shortness of breath for 1 day.  Infection.  EXAM: PORTABLE CHEST - 1 VIEW  COMPARISON:  11/11/2014  FINDINGS: Normal heart size and mediastinal contours. No acute infiltrate or edema. No effusion or pneumothorax. No acute osseous findings.  IMPRESSION: No active disease.   Electronically Signed   By: Monte Fantasia M.D.   On: 04/23/2015 17:08    Anti-infectives: Anti-infectives    Start     Dose/Rate Route Frequency Ordered Stop   04/22/15 2200  cefoTEtan (CEFOTAN) 2 g in dextrose 5 % 50 mL IVPB     2 g 100 mL/hr over 30 Minutes Intravenous Every 12 hours 04/22/15  1610 04/23/15 0112   04/22/15 0800  cefoTEtan (CEFOTAN) 2 g in dextrose 5 % 50 mL IVPB     2 g 100 mL/hr over 30 Minutes Intravenous To Surgery 04/21/15 1434 04/22/15 0846   04/22/15 0630  neomycin (MYCIFRADIN) tablet 1,000 mg  Status:  Discontinued     1,000 mg Oral 3 times per day 04/22/15 0630 04/22/15 0704   04/22/15 0630  erythromycin (E-MYCIN) tablet 1,000 mg  Status:  Discontinued     1,000 mg Oral 3 times per day 04/22/15 0630 04/22/15 0704      Assessment/Plan: s/p Procedure(s): LAPAROSCOPIC HAND ASSISTED SIGMOID COLECTOMY (N/A) Path  Stage 1 colon cancer pt told today Advance diet she is moving her bowels Gets oversedated at times  PCA stopped Her baseline is apathy so she is not far from that Urinary retention  I / O cath as needed.   DM 2  On SSI FSBG  Stable   LOS: 2 days    [name] Stewart A. 04/24/2015

## 2015-04-25 LAB — GLUCOSE, CAPILLARY
GLUCOSE-CAPILLARY: 151 mg/dL — AB (ref 70–99)
GLUCOSE-CAPILLARY: 186 mg/dL — AB (ref 70–99)
GLUCOSE-CAPILLARY: 164 mg/dL — AB (ref 70–99)
Glucose-Capillary: 138 mg/dL — ABNORMAL HIGH (ref 70–99)

## 2015-04-25 LAB — URINE CULTURE
COLONY COUNT: NO GROWTH
Culture: NO GROWTH
Special Requests: NORMAL

## 2015-04-25 NOTE — Progress Notes (Signed)
Patient ID: [name], female   DOB: 1966-03-24, 49 y.o.   MRN: [id number] [facility] Progress Note:   3 Days Post-Op  Subjective: Mental status is clear but affect is flat.  Asking when she can eat since she is taking liquids just fine Objective: Vital signs in last 24 hours: Temp:  [97.7 F (36.5 C)-98.6 F (37 C)] 98.5 F (36.9 C) (04/30 0523) Pulse Rate:  [75-87] 75 (04/30 0523) Resp:  [16-18] 16 (04/30 0523) BP: (103-112)/(45-56) 103/56 mmHg (04/30 0523) SpO2:  [94 %-98 %] 94 % (04/30 0523)  Intake/Output from previous day: 04/29 0701 - 04/30 0700 In: 3245 [P.O.:1320; I.V.:1925] Out: 1950 [Urine:1950] Intake/Output this shift:    Physical Exam: Work of breathing is normal.  Wearing pink gloves for her diabetic neuropathy.  Abdomen is sore  Lab Results:  Results for orders placed or performed during the hospital encounter of 04/22/15 (from the past 48 hour(s))  Glucose, capillary     Status: Abnormal   Collection Time: 04/23/15 11:53 AM  Result Value Ref Range   Glucose-Capillary 154 (H) 70 - 99 mg/dL  Glucose, capillary     Status: Abnormal   Collection Time: 04/23/15  5:23 PM  Result Value Ref Range   Glucose-Capillary 141 (H) 70 - 99 mg/dL  Glucose, capillary     Status: Abnormal   Collection Time: 04/23/15  8:28 PM  Result Value Ref Range   Glucose-Capillary 133 (H) 70 - 99 mg/dL  Glucose, capillary     Status: Abnormal   Collection Time: 04/23/15 11:47 PM  Result Value Ref Range   Glucose-Capillary 137 (H) 70 - 99 mg/dL  Urinalysis, Routine w reflex microscopic     Status: Abnormal   Collection Time: 04/23/15 11:55 PM  Result Value Ref Range   Color, Urine YELLOW YELLOW   APPearance CLEAR CLEAR   Specific Gravity, Urine 1.011 1.005 - 1.030   pH 5.5 5.0 - 8.0   Glucose, UA NEGATIVE NEGATIVE mg/dL   Hgb urine dipstick NEGATIVE NEGATIVE   Bilirubin Urine NEGATIVE NEGATIVE   Ketones, ur NEGATIVE NEGATIVE mg/dL   Protein, ur NEGATIVE NEGATIVE  mg/dL   Urobilinogen, UA 0.2 0.0 - 1.0 mg/dL   Nitrite NEGATIVE NEGATIVE   Leukocytes, UA SMALL (A) NEGATIVE  Urine microscopic-add on     Status: None   Collection Time: 04/23/15 11:55 PM  Result Value Ref Range   Squamous Epithelial / LPF RARE RARE   WBC, UA 3-6 <3 WBC/hpf   Bacteria, UA RARE RARE  Glucose, capillary     Status: Abnormal   Collection Time: 04/24/15  3:58 AM  Result Value Ref Range   Glucose-Capillary 168 (H) 70 - 99 mg/dL  Glucose, capillary     Status: Abnormal   Collection Time: 04/24/15  7:48 AM  Result Value Ref Range   Glucose-Capillary 100 (H) 70 - 99 mg/dL  Basic metabolic panel     Status: Abnormal   Collection Time: 04/24/15  9:39 AM  Result Value Ref Range   Sodium 136 135 - 145 mmol/L   Potassium 3.8 3.5 - 5.1 mmol/L   Chloride 103 96 - 112 mmol/L   CO2 25 19 - 32 mmol/L   Glucose, Bld 228 (H) 70 - 99 mg/dL   BUN <5 (L) 6 - 23 mg/dL   Creatinine, Ser 0.91 0.50 - 1.10 mg/dL   Calcium 8.3 (L) 8.4 - 10.5 mg/dL   GFR calc non Af Amer 73 (L) >90 mL/min  GFR calc Af Amer 84 (L) >90 mL/min    Comment: (NOTE) The eGFR has been calculated using the CKD EPI equation. This calculation has not been validated in all clinical situations. eGFR's persistently <90 mL/min signify possible Chronic Kidney Disease.    Anion gap 8 5 - 15  Glucose, capillary     Status: Abnormal   Collection Time: 04/24/15 11:58 AM  Result Value Ref Range   Glucose-Capillary 159 (H) 70 - 99 mg/dL  Glucose, capillary     Status: Abnormal   Collection Time: 04/24/15  4:57 PM  Result Value Ref Range   Glucose-Capillary 103 (H) 70 - 99 mg/dL  Glucose, capillary     Status: Abnormal   Collection Time: 04/24/15  9:34 PM  Result Value Ref Range   Glucose-Capillary 147 (H) 70 - 99 mg/dL   Comment 1 Notify RN    Comment 2 Document in Chart   Glucose, capillary     Status: Abnormal   Collection Time: 04/25/15  7:59 AM  Result Value Ref Range   Glucose-Capillary 151 (H) 70 - 99  mg/dL    Radiology/Results: Dg Chest Port 1 View  04/23/2015   CLINICAL DATA:  Shortness of breath for 1 day.  Infection.  EXAM: PORTABLE CHEST - 1 VIEW  COMPARISON:  11/11/2014  FINDINGS: Normal heart size and mediastinal contours. No acute infiltrate or edema. No effusion or pneumothorax. No acute osseous findings.  IMPRESSION: No active disease.   Electronically Signed   By: Monte Fantasia M.D.   On: 04/23/2015 17:08    Anti-infectives: Anti-infectives    Start     Dose/Rate Route Frequency Ordered Stop   04/22/15 2200  cefoTEtan (CEFOTAN) 2 g in dextrose 5 % 50 mL IVPB     2 g 100 mL/hr over 30 Minutes Intravenous Every 12 hours 04/22/15 1610 04/23/15 0112   04/22/15 0800  cefoTEtan (CEFOTAN) 2 g in dextrose 5 % 50 mL IVPB     2 g 100 mL/hr over 30 Minutes Intravenous To Surgery 04/21/15 1434 04/22/15 0846   04/22/15 0630  neomycin (MYCIFRADIN) tablet 1,000 mg  Status:  Discontinued     1,000 mg Oral 3 times per day 04/22/15 0630 04/22/15 0704   04/22/15 0630  erythromycin (E-MYCIN) tablet 1,000 mg  Status:  Discontinued     1,000 mg Oral 3 times per day 04/22/15 0630 04/22/15 1610      Assessment/Plan: Problem List: Patient Active Problem List   Diagnosis Date Noted  . Colon cancer 03/09/2015  . OSA (obstructive sleep apnea) 07/04/2014  . Migraine without aura, with intractable migraine, so stated, without mention of status migrainosus 03/26/2014  . Peripheral neuropathy 03/03/2014  . Abnormal stress test 02/12/2014  . CVA (cerebral infarction) 02/12/2014  . Chest pain   . Abnormal heart rhythm   . Edema   . Hypertension   . Hyperlipemia   . Hypercholesterolemia   . Other and unspecified hyperlipidemia 11/14/2013  . Type II or unspecified type diabetes mellitus without mention of complication, uncontrolled 07/22/2013  . Unspecified essential hypertension 07/22/2013  . Type II or unspecified type diabetes mellitus with neurological manifestations, uncontrolled  07/22/2013  . Polyneuropathy in diabetes(357.2) 07/22/2013    Taking liquids well after colectomy.  Advance to solids.   3 Days Post-Op    LOS: 3 days   Matt B. Hassell Done, MD, [facility] [phone] beeper [phone]  04/25/2015 9:33 AM

## 2015-04-26 LAB — GLUCOSE, CAPILLARY: GLUCOSE-CAPILLARY: 111 mg/dL — AB (ref 70–99)

## 2015-04-26 NOTE — Discharge Instructions (Signed)
Laparoscopic Colectomy, Care After Refer to this sheet in the next few weeks. These instructions provide you with information on caring for yourself after your procedure. Your health care provider may also give you more specific instructions. Your treatment has been planned according to current medical practices, but problems sometimes occur. Call your health care provider if you have any problems or questions after your procedure. WHAT TO EXPECT AFTER THE PROCEDURE After your procedure, it is typical to have the following:  Pain in your abdomen, especially at the incision sites. You will be given pain medicine to control the pain.  Tiredness. This is a normal part of the recovery process. Your energy level will return to normal over the next several weeks.  Constipation. You may be given stool softeners to prevent this. HOME CARE INSTRUCTIONS   Only take over-the-counter or prescription medicines as directed by your health care provider.  Ask your health care provider whether you may take a shower when you go home.  Remove or change any bandages (dressings) as directed.  You may resume a normal diet and activities as directed. Eat plenty of fruits and vegetables to help prevent constipation.  Drink enough fluids to keep your urine clear or pale yellow. This also helps prevent constipation.  Take rest breaks during the day as needed.  Avoid lifting anything heavier than 25 pounds (11.3 kg) or driving for 4 weeks or until your health care provider says it is okay.  Follow up with your health care provider as directed. Ask your health care provider when to make an appointment to get your stitches or staples removed. SEEK MEDICAL CARE IF:   You have increased bleeding from the incision areas.  You have redness, swelling, or increasing pain in the wounds.  You see pus coming from a wound.  You have a fever.  You notice a foul smell coming from the wound or dressing.  Your wound is  breaking open (edges not staying together) after sutures or staples have been removed. SEEK IMMEDIATE MEDICAL CARE IF:  You develop a rash.  You have chest pain or difficulty breathing.  You have pain or swelling in your legs.  You have lightheadedness or feel faint.  Your abdomen becomes larger (distended).  You have nausea or vomiting.  You have blood in your stools. Document Released: 07/01/2005 Document Revised: 10/02/2013 Document Reviewed: 07/24/2013 [facility] Patient Information 2015 [place], [place]. This information is not intended to replace advice given to you by your health care provider. Make sure you discuss any questions you have with your health care provider.

## 2015-04-26 NOTE — Discharge Summary (Signed)
Physician Discharge Summary  Patient ID: [name] MRN: [id number] DOB/AGE: 50/09/1966 50 y.o.  Admit date: 04/22/2015 Discharge date: 04/26/2015  Admission Diagnoses:  Sigmoid cancer  Discharge Diagnoses:  1. Colon, segmental resection for tumor, Sigmoid - COLONIC ADENOCARCINOMA EXTENDING INTO SUBMUCOSA, 1.3 CM - MARGINS NOT INVOLVED. 1 of 4 FINAL for [name], [name] [id number]) Diagnosis(continued) - NINE BENIGN LYMPH NODES (0/9). 2. Colon, segmental resection, Sigmoid - FOCAL MESENTERIC FAT NECROSIS WITH HEMORRHAGE AND FIBROSIS. - NO EVIDENCE OF MALIGNANCY. - ONE BENIGN LYMPH NODE (0/1). 3. Colon, resection margin (donut), Proximal donut - BENIGN COLON TISSUE. - NO EVIDENCE OF MALIGNANCY. 4. Colon, resection margin (donut), Distal donut - BENIGN COLON TISSUE. - NO EVIDENCE OF MALIGNANCY. Microscopic Comment 1. COLON AND RECTUM (INCLUDING TRANS-ANAL RESECTION): Specimen: Sigmoid colon with proximal and distal donut margins. Procedure: Segmental resection with proximal and distal donut margins. Tumor site: Sigmoid Specimen integrity: Previously opened Macroscopic intactness of mesorectum: Incomplete Macroscopic tumor perforation: No Invasive tumor: Maximum size: 1.3 cm Histologic type(s): Colonic adenocarcinoma Histologic grade and differentiation: G2: moderately differentiated/low grade Type of polyp in which invasive carcinoma arose: No residual polyp Microscopic extension of invasive tumor: Into submucosa Lymph-Vascular invasion: N Peri-neural invasion: No Tumor deposit(s) (discontinuous extramural extension): No Resection margins: Proximal margin: Free of tumor Distal margin: Free of tumor Circumferential (radial) (posterior ascending, posterior descending; lateral and posterior mid-rectum; and entire lower 1/3 rectum): N/A Mesenteric margin (sigmoid and transverse): Free of tumor Distance closest margin (if all above margins negative): N/A Treatment effect  (neo-adjuvant therapy): No Additional polyp(s): No Non-neoplastic findings: Focal mesenteric adipose tissue fat necrosis with hemorrhage and fibrosis. Lymph nodes: number examined 10; number positive: 0 Pathologic Staging: pT1, pN0, pMX Active Problems:   Colon cancer   Surgery:  Lap sigmoid colectomy  Discharged Condition: improved  Hospital Course:   Had surgery.  Despite her diabetic neuropathy she was begun on liquids and advanced.  Ready to go home on Sunday-wanting to go home.  She has pain meds at home.  Followup with Dr. Brantley Stage in a week for staple removal  Consults: none  Significant Diagnostic Studies: path as above    Discharge Exam: Blood pressure 90/53, pulse 76, temperature 97.8 F (36.6 C), temperature source Oral, resp. rate 17, height 5\' 5"  (1.651 m), weight 88.905 kg (196 lb), SpO2 93 %. Incision with staples and bland;    Disposition: 01-Home or Self Care  Discharge Instructions    Diet Carb Modified    Complete by:  As directed      Discharge instructions    Complete by:  As directed   Will need to get staples out in about 7 days     Discharge wound care:    Complete by:  As directed   May leave staple line open to air.  Apply neosporin ointment as needed.     Increase activity slowly    Complete by:  As directed             Medication List    STOP taking these medications        fluconazole 150 MG tablet  Commonly known as:  DIFLUCAN      TAKE these medications        ALPRAZolam 1 MG tablet  Commonly known as:  XANAX  Take 1 tablet (1 mg total) by mouth at bedtime as needed for anxiety.     aspirin 325 MG tablet  Take 1 tablet (325 mg total) by mouth daily.  atorvastatin 40 MG tablet  Commonly known as:  LIPITOR  TAKE 1 TABLET (40 MG TOTAL) BY MOUTH DAILY FOR CHOLESTEROL.     Vortioxetine HBr 5 MG Tabs  Take 5 mg by mouth daily.     BRINTELLIX 20 MG Tabs  Generic drug:  Vortioxetine HBr  Take 20 mg by mouth daily. Takes  with the 5mg  tablet for a total dose of 25mg      canagliflozin 100 MG Tabs tablet  Commonly known as:  INVOKANA  Take 1 tablet (100 mg total) by mouth daily.     chlorproMAZINE 200 MG tablet  Commonly known as:  THORAZINE  Take 200 mg by mouth at bedtime.     clonazePAM 2 MG tablet  Commonly known as:  KLONOPIN  Take 2 mg by mouth 3 (three) times daily as needed for anxiety.     diazepam 10 MG tablet  Commonly known as:  VALIUM  Take 20 mg by mouth at bedtime.     esomeprazole 40 MG capsule  Commonly known as:  NEXIUM  Take 40 mg by mouth 2 (two) times daily before a meal.     fenofibrate 145 MG tablet  Commonly known as:  TRICOR  Take 1 tablet (145 mg total) by mouth daily.     freestyle lancets  Test blood sugar 3 times daily as instructed. Dx code: 250.62     furosemide 20 MG tablet  Commonly known as:  LASIX  Take 20 mg by mouth daily.     glucose blood test strip  Commonly known as:  FREESTYLE LITE  Use as instructed to check blood sugar 2 times daily dx code 250.02     insulin aspart 100 UNIT/ML FlexPen  Commonly known as:  NOVOLOG FLEXPEN  Inject 12 Units into the skin 3 (three) times daily with meals.     Insulin Pen Needle 31G X 5 MM Misc  Use to inject insulin 4 times per day     LANTUS SOLOSTAR 100 UNIT/ML Solostar Pen  Generic drug:  Insulin Glargine  INJECT 46 UNITS DAILY AT THE SAME TIME EVERY DAY. IF SUGAR GOES ABOVE 200 INCREASE DOSE TO 50     lidocaine 5 % ointment  Commonly known as:  XYLOCAINE  Apply 1 application topically daily as needed for mild pain.     multivitamin capsule  Take 2 capsules by mouth daily.     oxyCODONE 5 MG immediate release tablet  Commonly known as:  Oxy IR/ROXICODONE  Take 5 mg by mouth daily.     oxyCODONE-acetaminophen 10-325 MG per tablet  Commonly known as:  PERCOCET  Take 1 tablet by mouth 3 (three) times daily as needed for pain.     RAPAFLO 8 MG Caps capsule  Generic drug:  silodosin  Take 8 mg by  mouth daily.     rizatriptan 10 MG tablet  Commonly known as:  MAXALT  Take 1 tablet (10 mg total) by mouth as needed for migraine. May repeat in 2 hours if needed     spironolactone 25 MG tablet  Commonly known as:  ALDACTONE  Take 2 tablets (50 mg total) by mouth 2 (two) times daily.     tizanidine 2 MG capsule  Commonly known as:  ZANAFLEX  2 mg po qhs for one week then increase to 4mg  po qhs     TRULICITY [id number] Sopn  Generic drug:  Dulaglutide  Take 0.75 mg by mouth once a week.  Follow-up Information    Follow up with CORNETT,THOMAS A., MD. Schedule an appointment as soon as possible for a visit in 1 week.   Specialty:  General Surgery   Contact information:   [address] [place] [address] [phone]       Signed: Pedro Earls 04/26/2015, 9:58 AM

## 2015-05-04 ENCOUNTER — Other Ambulatory Visit: Payer: Self-pay | Admitting: Surgery

## 2015-05-04 ENCOUNTER — Other Ambulatory Visit: Payer: Self-pay | Admitting: Endocrinology

## 2015-05-04 ENCOUNTER — Emergency Department (HOSPITAL_COMMUNITY)
Admission: EM | Admit: 2015-05-04 | Discharge: 2015-05-04 | Disposition: A | Discharge status: Home / Self Care | Payer: Medicare Other | Attending: Emergency Medicine | Admitting: Emergency Medicine

## 2015-05-04 ENCOUNTER — Encounter (HOSPITAL_COMMUNITY): Payer: Self-pay

## 2015-05-04 DIAGNOSIS — Z8673 Personal history of transient ischemic attack (TIA), and cerebral infarction without residual deficits: Secondary | ICD-10-CM | POA: Diagnosis not present

## 2015-05-04 DIAGNOSIS — E780 Pure hypercholesterolemia: Secondary | ICD-10-CM | POA: Insufficient documentation

## 2015-05-04 DIAGNOSIS — Z8701 Personal history of pneumonia (recurrent): Secondary | ICD-10-CM | POA: Insufficient documentation

## 2015-05-04 DIAGNOSIS — I209 Angina pectoris, unspecified: Secondary | ICD-10-CM | POA: Diagnosis not present

## 2015-05-04 DIAGNOSIS — Z79899 Other long term (current) drug therapy: Secondary | ICD-10-CM | POA: Insufficient documentation

## 2015-05-04 DIAGNOSIS — Z87448 Personal history of other diseases of urinary system: Secondary | ICD-10-CM | POA: Diagnosis not present

## 2015-05-04 DIAGNOSIS — Y838 Other surgical procedures as the cause of abnormal reaction of the patient, or of later complication, without mention of misadventure at the time of the procedure: Secondary | ICD-10-CM | POA: Diagnosis not present

## 2015-05-04 DIAGNOSIS — T8131XA Disruption of external operation (surgical) wound, not elsewhere classified, initial encounter: Secondary | ICD-10-CM | POA: Insufficient documentation

## 2015-05-04 DIAGNOSIS — K219 Gastro-esophageal reflux disease without esophagitis: Secondary | ICD-10-CM | POA: Insufficient documentation

## 2015-05-04 DIAGNOSIS — Z91040 Latex allergy status: Secondary | ICD-10-CM | POA: Insufficient documentation

## 2015-05-04 DIAGNOSIS — G8929 Other chronic pain: Secondary | ICD-10-CM | POA: Insufficient documentation

## 2015-05-04 DIAGNOSIS — M1990 Unspecified osteoarthritis, unspecified site: Secondary | ICD-10-CM | POA: Insufficient documentation

## 2015-05-04 DIAGNOSIS — E785 Hyperlipidemia, unspecified: Secondary | ICD-10-CM | POA: Insufficient documentation

## 2015-05-04 DIAGNOSIS — Z8614 Personal history of Methicillin resistant Staphylococcus aureus infection: Secondary | ICD-10-CM | POA: Diagnosis not present

## 2015-05-04 DIAGNOSIS — Z720 Tobacco use: Secondary | ICD-10-CM | POA: Diagnosis not present

## 2015-05-04 DIAGNOSIS — Z9889 Other specified postprocedural states: Secondary | ICD-10-CM | POA: Diagnosis not present

## 2015-05-04 DIAGNOSIS — I10 Essential (primary) hypertension: Secondary | ICD-10-CM | POA: Insufficient documentation

## 2015-05-04 DIAGNOSIS — F329 Major depressive disorder, single episode, unspecified: Secondary | ICD-10-CM | POA: Insufficient documentation

## 2015-05-04 DIAGNOSIS — Z7982 Long term (current) use of aspirin: Secondary | ICD-10-CM | POA: Insufficient documentation

## 2015-05-04 DIAGNOSIS — F419 Anxiety disorder, unspecified: Secondary | ICD-10-CM | POA: Diagnosis not present

## 2015-05-04 DIAGNOSIS — C189 Malignant neoplasm of colon, unspecified: Secondary | ICD-10-CM

## 2015-05-04 DIAGNOSIS — J449 Chronic obstructive pulmonary disease, unspecified: Secondary | ICD-10-CM | POA: Insufficient documentation

## 2015-05-04 DIAGNOSIS — E119 Type 2 diabetes mellitus without complications: Secondary | ICD-10-CM | POA: Diagnosis not present

## 2015-05-04 DIAGNOSIS — K9189 Other postprocedural complications and disorders of digestive system: Secondary | ICD-10-CM | POA: Diagnosis present

## 2015-05-04 LAB — COMPREHENSIVE METABOLIC PANEL
ALT: 15 U/L (ref 14–54)
ANION GAP: 12 (ref 5–15)
AST: 17 U/L (ref 15–41)
Albumin: 3.5 g/dL (ref 3.5–5.0)
Alkaline Phosphatase: 53 U/L (ref 38–126)
BUN: <5 mg/dL — ABNORMAL LOW (ref 6–20)
CHLORIDE: 103 mmol/L (ref 101–111)
CO2: 24 mmol/L (ref 22–32)
Calcium: 9.2 mg/dL (ref 8.9–10.3)
Creatinine, Ser: 0.95 mg/dL (ref 0.44–1.00)
GLUCOSE: 225 mg/dL — AB (ref 70–99)
Potassium: 3.4 mmol/L — ABNORMAL LOW (ref 3.5–5.1)
Sodium: 139 mmol/L (ref 135–145)
Total Bilirubin: 0.4 mg/dL (ref 0.3–1.2)
Total Protein: 6.6 g/dL (ref 6.5–8.1)

## 2015-05-04 LAB — CBC WITH DIFFERENTIAL/PLATELET
BASOS ABS: 0.1 10*3/uL (ref 0.0–0.1)
Basophils Relative: 1 % (ref 0–1)
Eosinophils Absolute: 0 10*3/uL (ref 0.0–0.7)
Eosinophils Relative: 0 % (ref 0–5)
HCT: 34.5 % — ABNORMAL LOW (ref 36.0–46.0)
Hemoglobin: 11.6 g/dL — ABNORMAL LOW (ref 12.0–15.0)
LYMPHS PCT: 20 % (ref 12–46)
Lymphs Abs: 2.8 10*3/uL (ref 0.7–4.0)
MCH: 29.4 pg (ref 26.0–34.0)
MCHC: 33.6 g/dL (ref 30.0–36.0)
MCV: 87.6 fL (ref 78.0–100.0)
Monocytes Absolute: 0.4 10*3/uL (ref 0.1–1.0)
Monocytes Relative: 3 % (ref 3–12)
NEUTROS ABS: 10.3 10*3/uL — AB (ref 1.7–7.7)
Neutrophils Relative %: 76 % (ref 43–77)
PLATELETS: 337 10*3/uL (ref 150–400)
RBC: 3.94 MIL/uL (ref 3.87–5.11)
RDW: 12.9 % (ref 11.5–15.5)
WBC: 13.6 10*3/uL — AB (ref 4.0–10.5)

## 2015-05-04 NOTE — ED Notes (Signed)
Pt was seen today and has sutures removed, steri strips noted but not appropriately sticking or holding would together.

## 2015-05-04 NOTE — Discharge Instructions (Signed)
Read all the instructions below. Apply a wet-to-dry dressing at least once per day. Apply a dry gauze over top. You may clean the wound with soap and water. [facility] will contact you within 24 hours to schedule a follow up appointment to further discuss home wound care. Return to the ED as needed if symptoms worsen.  Wound Dehiscence Wound dehiscence is when a surgical cut (incision) breaks open and does not heal properly after surgery. It usually happens 7-10 days after surgery. This can be a serious condition. It is important to identify and treat this condition early.  CAUSES  Some common causes of wound dehiscence include:  Stretching of the wound area. This may be caused by lifting, vomiting, violent coughing, or straining during bowel movements.  Wound infection.  Early stitch (suture) removal. RISK FACTORS Various things can increase your risk of developing wound dehiscence, including:  Obesity.  Lung disease.  Smoking.  Poor nutrition.  Contamination during surgery. SIGNS AND SYMPTOMS  Bleeding from the wound.  Pain.  Fever.  Wound starts breaking open. DIAGNOSIS  Your health care provider may diagnose wound dehiscence by monitoring the incision and noting any changes in the wound. These changes can include an increase in drainage or pain. The health care provider may also ask you if you have noticed any stretching or tearing of the wound.  Wound cultures may be taken to determine if there is an infection.  Imaging studies, such as an MRI scan or CT scan, may be done to determine if there is a collection of pus or fluid in the wound area. TREATMENT Treatment may include:  Wound care.  Surgical repair.  Antibiotic medicine to treat or prevent infection.  Medicines to reduce pain and swelling. HOME CARE INSTRUCTIONS   Only take over-the-counter or prescription medicines for pain, discomfort, or fever as directed by your health care provider.  Taking pain medicine 30 minutes before changing a bandage (dressing) can help relieve pain.  Take your antibiotics as directed. Finish them even if you start to feel better.  Gently wash the area with mild soap and water 2 times a day, or as directed. Rinse off the soap. Pat the area dry with a clean towel. Do not rub the wound. This may cause bleeding.  Follow your health care provider's instructions for how often you need to change the dressing and packing inside. Wash your hands well before and after changing your dressing. Apply a dressing to the wound as directed.  Take showers. Do not soak the wound, bathe, swim, or use a hot tub until directed by your health care provider.  Avoid exercises that make you sweat heavily.  Use anti-itch medicine as directed by your health care provider. The wound may itch when it is healing. Do not pick or scratch at the wound.  Do not lift more than 10 pounds (4.5 kg) until the wound is healed, or as directed by your health care provider.  Keep all follow-up appointments as directed. SEEK MEDICAL CARE IF:  You have excessive bleeding from your surgical wound.  Your wound does not seem to be healing properly.  You have a fever. SEEK IMMEDIATE MEDICAL CARE IF:   You have increased swelling or redness around the wound.  You have increasing pain in the wound.  You have an increasing amount of pus coming from the wound.  Your wound breaks open farther. MAKE SURE YOU:   Understand these instructions.  Will watch your condition.  Will get help right away if you are not doing well or get worse. Document Released: 03/03/2004 Document Revised: 12/17/2013 Document Reviewed: 08/19/2013 [facility] Patient Information 2015 [place], [place]. This information is not intended to replace advice given to you by your health care provider. Make sure you discuss any questions you have with your health care provider.  Wet to dry dressing changes  Your health  care provider may ask you to change your dressing at home. By placing a wet (or moist) gauze dressing on your wound and allowing it to dry, wound drainage and dead tissue can be removed when you take off the old dressing.  Your provider will tell you how often you should change your dressing at home.  As the wound heals, you should not need as much gauze or packing gauze.  Follow any instructions you are given on how to change the dressing. Use this sheet as a reminder.  Removing the Old Dressing Follow these steps to remove your dressing: Wash your hands thoroughly with soap and warm water before and after each dressing change. Put on a pair of non-sterile gloves. Carefully remove the tape. Remove the old dressing. If it is sticking to your skin, wet it with warm water to loosen it. Remove the gauze pads or packing tape from inside your wound. Put the old dressing, packing material, and your gloves in a plastic bag. Set the bag aside. Cleaning Your Wound Follow these steps to clean your wound:  Put on a new pair of non-sterile gloves. Use a clean, soft washcloth to gently clean your wound with warm water and soap. Your wound should not bleed much when you are cleaning it. A small amount of blood is okay. Rinse your wound with water. Gently pat it dry with a clean towel. DO NOT rub it dry. In some cases, you can even rinse the wound while showering. Check the wound for increased redness, swelling, or a bad odor. Pay attention to the color and amount of drainage from your wound. Look for drainage that has become darker or thicker. After cleaning your wound, remove your gloves and put them in the plastic bag with the old dressing and gloves. Wash your hands again.  Changing Your Dressing  Follow these steps to put a new dressing on: Put on a new pair of non-sterile gloves. Pour saline into a clean bowl. Place gauze pads and any packing tape you will use in the bowl. Squeeze the saline  from the gauze pads or packing tape until it is no longer dripping. Place the gauze pads or packing tape in your wound. Carefully fill in the wound and any spaces under the skin. Cover the wet gauze or packing tape with a large dry dressing pad. Use tape or rolled gauze to hold this dressing in place. Put all used supplies in the plastic bag. Close it securely, then put it in a second plastic bag, and close that bag securely. Put it in the trash. Wash your hands again when you are finished.  When to Call the Doctor Call your doctor if you have any of these changes around your wound: Worsening redness More pain Swelling Bleeding It is larger or deeper It looks dried out or dark The drainage is increasing The drainage has a bad smell  Also call your doctor if: Your temperature is 100.68F (38C), or higher, for more than 4 hours Drainage is coming from or around the wound Drainage is not decreasing after 3 to  5 days Drainage is increasing Drainage becomes thick, tan, yellow, or smells bad

## 2015-05-04 NOTE — ED Notes (Signed)
Pt had recent surgery for colon cancer and pt has dehiscence noted of wound to umbilical area. Seen at urgent care and told there was a wait and sent her down.

## 2015-05-04 NOTE — ED Notes (Signed)
Pt stable, ambulatory, states understanding of discharge instructions, pain 2/10

## 2015-05-04 NOTE — ED Provider Notes (Signed)
CSN: [id number]     Arrival date & time 05/04/15  2002 History   First MD Initiated Contact with Patient 05/04/15 2043     Chief Complaint  Patient presents with  . Post-op Problem    (Consider location/radiation/quality/duration/timing/severity/associated sxs/prior Treatment) HPI Comments: Patient is a 49 year old female with history of hypertension, hyperlipidemia, bipolar disorder, COPD, diabetes mellitus, and colonic adenocarcinoma. She is 12 days s/p segmental resection of her sigmoid colon by Dr. Brantley Stage for adenocarcinoma. Patient reports that she saw Dr. Brantley Stage in the office today who removed her staples and applied Steri-Strips over top of the wound. She reports that she has a history of urinary incontinence and felt wetness in her underwear and thought she had an episode of this. She reports that she went to the bathroom and noticed that there was blood on her pants and underwear and that her wound had dehisced. She has had some mild bleeding since this time, but denies any purulent drainage, erythema, heat to touch, or fever. She is not on blood thinners and reports a constant, nonradiating, dull pain to her wound site. She has been having normal bowel movements and passing flatus since discharge following her surgery.  The history is provided by the patient. No language interpreter was used.    Past Medical History  Diagnosis Date  . Hypertension   . Hyperlipemia   . GERD (gastroesophageal reflux disease)   . Insomnia   . SUI (stress urinary incontinence, female) S/P SLING 12-29-2011  . SOB (shortness of breath) on exertion   . Bipolar disorder   . Neurogenic bladder   . Hypercholesterolemia   . Gastroparesis   . Ulcer   . MRSA (methicillin resistant Staphylococcus aureus)   . Chronic pain   . Edema   . Chest pain     a. 2008 Cath: normal cors;  b. 12/2013 Lexi CL: EF 47%, no ischemia/infarct.  . Anxiety   . Vertigo   . COPD (chronic obstructive pulmonary disease)   .  Cholecystitis   . Hypersomnia   . Tobacco abuse   . Claudication     a. 12/2013 ABI's: R 0.97, L 0.94.  [name] Palpitations   . Anginal pain   . Pneumonia     "twice, I think" (02/12/2014)  . Sleep apnea     didn't tolerate cpap (02/12/2014)  . Type II diabetes mellitus   . History of blood transfusion 1986    "related to lost a child" (02/12/2014)  . H/O hiatal hernia   . [id number])     "weekly" (02/12/2014)  . Stroke 02/12/2014    "eyesight is messed up; not steady on my feet" (02/12/2014)  . Arthritis     "qwhere" (02/12/2014)  . DJD (degenerative joint disease)   . Chronic back pain   . Depression   . Personality disorder   . OSA (obstructive sleep apnea) 07/04/2014   Past Surgical History  Procedure Laterality Date  . Knee arthroscopy w/ allograft impant Left     graft x 2  . Foot surgery Bilateral   . Anterior cervical decomp/discectomy fusion  2000    C6 - 7  . Pubovaginal sling  12/29/2011    Procedure: Gaynelle Arabian;  Surgeon: Reece Packer, MD;  Location: [facility];  Service: Urology;  Laterality: N/A;  cysto and sparc sling   . Lumbar fusion      "cage in my spine"  . Total abdominal hysterectomy w/ bilateral salpingoophorectomy  1997  .  Multiple laparoscopies for endometriosis    . Cesarean section  1989; 1992  . Repeat reconstruction acl left knee/ screws removed  03-28-2000    CADAVER GRAFT  . 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 Knee surgery      TOTAL 8 SURG'S  . Cystoscopy with injection  05/04/2012    Procedure: CYSTOSCOPY WITH INJECTION;  Surgeon: Reece Packer, MD;  Location: [facility];  Service: Urology;  Laterality: N/A;  MACROPLASTIQUE INJECTION  . Cystoscopy with injection  08/28/2012    Procedure: CYSTOSCOPY WITH INJECTION;  Surgeon: Reece Packer, MD;  Location: [facility];  Service: Urology;  Laterality: N/A;  cysto and macroplastique   . Carpal tunnel release Right  04-25-2013  . Cysto N/A 04/30/2013    Procedure: CYSTOSCOPY;  Surgeon: Reece Packer, MD;  Location: [facility];  Service: Urology;  Laterality: N/A;  . Pubovaginal sling N/A 04/30/2013    Procedure: REMOVAL OF VAGINAL MESH;  Surgeon: Reece Packer, MD;  Location: [facility];  Service: Urology;  Laterality: N/A;  . Abdominal hysterectomy    . Hernia repair  ?1996    "stomach"  . Cardiac catheterization  05-22-2008   DR SKAINS    NO SIGNIFECANT CAD/ NORMAL LV/  EF 65%/  NO WALL MOTION ABNORMALITIES  . Cardiac catheterization  02/12/2014  . Left heart catheterization with coronary angiogram N/A 02/12/2014    Procedure: LEFT HEART CATHETERIZATION WITH CORONARY ANGIOGRAM;  Surgeon: Candee Furbish, MD;  Location: [facility] CATH LAB;  Service: Cardiovascular;  Laterality: N/A;  . Laparoscopic sigmoid colectomy  04/22/2015  . Laparoscopic sigmoid colectomy N/A 04/22/2015    Procedure: LAPAROSCOPIC HAND ASSISTED SIGMOID COLECTOMY;  Surgeon: Erroll Luna, MD;  Location: [facility];  Service: General;  Laterality: N/A;   Family History  Problem Relation Age of Onset  . Hypertension Mother   . Diabetes Mother   . Cancer - Other Mother   . Cancer - Other Father   . Heart attack Father    History  Substance Use Topics  . Smoking status: Current Every Day Smoker -- 1.00 packs/day for 41 years    Types: Cigarettes, E-cigarettes  . Smokeless tobacco: Never Used  . Alcohol Use: 0.0 oz/week     Comment: 02/12/2014 "might have a mixed drink on special occasions"   OB History    No data available      Review of Systems  Constitutional: Negative for fever.  Gastrointestinal: Negative for nausea and vomiting.  Skin: Positive for wound.  All other systems reviewed and are negative.   Allergies  Latex; Ibuprofen; Sweetening enhancer; Tetracyclines & related; Trazodone and nefazodone; Triazolam; and Aspartame and phenylalanine  Home Medications   Prior to Admission medications   Medication Sig Start Date End  Date Taking? Authorizing Provider  atorvastatin (LIPITOR) 40 MG tablet TAKE 1 TABLET (40 MG TOTAL) BY MOUTH DAILY FOR CHOLESTEROL. 03/24/15  Yes Thompson Grayer, MD  BRINTELLIX 20 MG TABS Take 20 mg by mouth daily. Takes with the 5mg  tablet for a total dose of 25mg  12/16/14  Yes Historical Provider, MD  Canagliflozin (INVOKANA) 100 MG TABS Take 1 tablet (100 mg total) by mouth daily. 09/24/14  Yes Elayne Snare, MD  chlorproMAZINE (THORAZINE) 200 MG tablet Take 200 mg by mouth at bedtime.   Yes Historical Provider, MD  diazepam (VALIUM) 10 MG tablet Take 20 mg by mouth at bedtime.    Yes Historical Provider, MD  esomeprazole (NEXIUM) 40 MG  capsule Take 40 mg by mouth 2 (two) times daily before a meal.   Yes Historical Provider, MD  fenofibrate (TRICOR) 145 MG tablet Take 1 tablet (145 mg total) by mouth daily. 03/11/15  Yes Elayne Snare, MD  furosemide (LASIX) 20 MG tablet Take 20 mg by mouth daily.   Yes Historical Provider, MD  LANTUS SOLOSTAR 100 UNIT/ML Solostar Pen INJECT 52 UNITS DAILY AT THE SAME TIME EVERY DAY. IF SUGAR GOES ABOVE 200 INCREASE DOSE TO 50 Patient taking differently: INJECT 50 UNITS DAILY AT THE SAME TIME EVERY DAY. IF SUGAR GOES ABOVE 200 INCREASE DOSE TO 50 05/04/15  Yes Elayne Snare, MD  oxyCODONE (OXY IR/ROXICODONE) 5 MG immediate release tablet Take 5 mg by mouth daily.  04/17/14  Yes Historical Provider, MD  oxyCODONE-acetaminophen (PERCOCET) 10-325 MG per tablet Take 1 tablet by mouth 3 (three) times daily as needed for pain.   Yes Historical Provider, MD  RAPAFLO 8 MG CAPS capsule Take 8 mg by mouth daily.  09/22/14  Yes Historical Provider, MD  rizatriptan (MAXALT) 10 MG tablet Take 1 tablet (10 mg total) by mouth as needed for migraine. May repeat in 2 hours if needed 06/11/14  Yes Garvin Fila, MD  tizanidine (ZANAFLEX) 2 MG capsule 2 mg po qhs for one week then increase to 4mg  po qhs Patient taking differently: 4 mg. 2 mg po qhs for one week then increase to 4mg  po qhs 04/25/14  Yes  Garvin Fila, MD  TRULICITY [id number] SOPN Take 0.75 mg by mouth once a week.  02/12/15  Yes Historical Provider, MD  Vortioxetine HBr 5 MG TABS Take 5 mg by mouth daily.   Yes Historical Provider, MD  ALPRAZolam Duanne Moron) 1 MG tablet Take 1 tablet (1 mg total) by mouth at bedtime as needed for anxiety. Patient not taking: Reported on 05/04/2015 07/04/14   Larey Seat, MD  aspirin 325 MG tablet Take 1 tablet (325 mg total) by mouth daily. Patient not taking: Reported on 03/09/2015 02/14/14   Verlee Monte, MD  insulin aspart (NOVOLOG FLEXPEN) 100 UNIT/ML FlexPen Inject 12 Units into the skin 3 (three) times daily with meals. Patient not taking: Reported on 04/10/2015 07/09/14   Elayne Snare, MD  spironolactone (ALDACTONE) 25 MG tablet Take 2 tablets (50 mg total) by mouth 2 (two) times daily. Patient not taking: Reported on 05/04/2015 01/15/15   Elayne Snare, MD   BP 127/74 mmHg  Pulse 75  Temp(Src) 98.2 F (36.8 C) (Oral)  Resp 18  Ht 5\' 5"  (1.651 m)  Wt 198 lb (89.812 kg)  BMI 32.95 kg/m2  SpO2 96%   Physical Exam  Constitutional: She is oriented to person, place, and time. She appears well-developed and well-nourished. No distress.  Nontoxic/nonseptic appearing.  HENT:  Head: Normocephalic and atraumatic.  Eyes: Conjunctivae and EOM are normal. No scleral icterus.  Neck: Normal range of motion.  Cardiovascular: Normal rate, regular rhythm and intact distal pulses.   Pulmonary/Chest: Effort normal. No respiratory distress.  Respirations even and unlabored.  Musculoskeletal: Normal range of motion.  Neurological: She is alert and oriented to person, place, and time. She exhibits normal muscle tone. Coordination normal.  GCS 15. Patient moves extremities without ataxia and ambulates with steady gait.  Skin: Skin is warm and dry. No rash noted. She is not diaphoretic. No erythema. No pallor.  Significant dehiscence to wound of suprapubic abdomen measuring approximately 5cm in length. No  surrounding erythema or heat to touch. No active  bleeding or appreciable tenderness to the wound site.  Psychiatric: She has a normal mood and affect. Her behavior is normal.  Nursing note and vitals reviewed.   ED Course  Procedures (including critical care time) Labs Review Labs Reviewed  CBC WITH DIFFERENTIAL/PLATELET - Abnormal; Notable for the following:    WBC 13.6 (*)    Hemoglobin 11.6 (*)    HCT 34.5 (*)    Neutro Abs 10.3 (*)    All other components within normal limits  COMPREHENSIVE METABOLIC PANEL - Abnormal; Notable for the following:    Potassium 3.4 (*)    Glucose, Bld 225 (*)    BUN <5 (*)    All other components within normal limits    Imaging Review No results found.   EKG Interpretation None           MDM   Final diagnoses:  Wound dehiscence, initial encounter    49 year old female presents to the emergency department for further evaluation of wound dehiscence after staple removal by Dr. Brantley Stage today. Patient nontoxic and nonseptic appearing. She is afebrile and hemodynamically stable. No evidence of secondary infection to wound site. Patient has been seen by Dr. Redmond Pulling of [facility] who recommends outpatient management with wet-to-dry dressings daily with placement of a dry gauze over top. He reports that [facility] will contact the patient tomorrow to schedule outpatient follow-up to further discuss wound care. He does not see indication for antibiotics at this time. Wound dressed in ED and instructions provided to patient for home wound care. Return precautions given and patient discharged in good condition.   Filed Vitals:   05/04/15 2215 05/04/15 2230 05/04/15 2245 05/04/15 2300  BP: 145/67 137/64 127/74 138/73  Pulse: 78 77 75 74  Temp:      TempSrc:      Resp:      Height:      Weight:      SpO2: 95% 98% 96% 97%     Antonietta Breach, PA-C 05/04/15 [place], MD 05/07/15 [phone]

## 2015-05-04 NOTE — Consult Note (Signed)
Reason for Consult:skin incision separated Referring Physician: Dr Maia Plan JEANNIA TATRO is an 49 y.o. female.  HPI: 49 yo with Bipolar, DM2, HTN, tobacco use s/p laparoscopic sigmoid colectomy 4/27 for cancer had her skin staples removed in the office earlier today and she went home and went to urinate but she felt some liquid on her lower pants. She saw that her skin had separated and came to the ED. She does smoke. She denies fever, chills, nausea, vomiting, d/c. Has some mild TTP around skin opening.   Past Medical History  Diagnosis Date  . Hypertension   . Hyperlipemia   . GERD (gastroesophageal reflux disease)   . Insomnia   . SUI (stress urinary incontinence, female) S/P SLING 12-29-2011  . SOB (shortness of breath) on exertion   . Bipolar disorder   . Neurogenic bladder   . Hypercholesterolemia   . Gastroparesis   . Ulcer   . MRSA (methicillin resistant Staphylococcus aureus)   . Chronic pain   . Edema   . Chest pain     a. 2008 Cath: normal cors;  b. 12/2013 Lexi CL: EF 47%, no ischemia/infarct.  . Anxiety   . Vertigo   . COPD (chronic obstructive pulmonary disease)   . Cholecystitis   . Hypersomnia   . Tobacco abuse   . Claudication     a. 12/2013 ABI's: R 0.97, L 0.94.  [name] Palpitations   . Anginal pain   . Pneumonia     "twice, I think" (02/12/2014)  . Sleep apnea     didn't tolerate cpap (02/12/2014)  . Type II diabetes mellitus   . History of blood transfusion 1986    "related to lost a child" (02/12/2014)  . H/O hiatal hernia   . CVELFYBO(175.1)     "weekly" (02/12/2014)  . Stroke 02/12/2014    "eyesight is messed up; not steady on my feet" (02/12/2014)  . Arthritis     "qwhere" (02/12/2014)  . DJD (degenerative joint disease)   . Chronic back pain   . Depression   . Personality disorder   . OSA (obstructive sleep apnea) 07/04/2014    Past Surgical History  Procedure Laterality Date  . Knee arthroscopy w/ allograft impant Left     graft x 2  . Foot  surgery Bilateral   . Anterior cervical decomp/discectomy fusion  2000    C6 - 7  . Pubovaginal sling  12/29/2011    Procedure: Gaynelle Arabian;  Surgeon: Reece Packer, MD;  Location: [facility];  Service: Urology;  Laterality: N/A;  cysto and sparc sling   . Lumbar fusion      "cage in my spine"  . Total abdominal hysterectomy w/ bilateral salpingoophorectomy  1997  . Multiple laparoscopies for endometriosis    . Cesarean section  1989; 1992  . Repeat reconstruction acl left knee/ screws removed  03-28-2000    CADAVER GRAFT  . 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 Knee surgery      TOTAL 8 SURG'S  . Cystoscopy with injection  05/04/2012    Procedure: CYSTOSCOPY WITH INJECTION;  Surgeon: Reece Packer, MD;  Location: [facility];  Service: Urology;  Laterality: N/A;  MACROPLASTIQUE INJECTION  . Cystoscopy with injection  08/28/2012    Procedure: CYSTOSCOPY WITH INJECTION;  Surgeon: Reece Packer, MD;  Location: [facility];  Service: Urology;  Laterality: N/A;  cysto and macroplastique   . Carpal tunnel release  Right 04-25-2013  . Cysto N/A 04/30/2013    Procedure: CYSTOSCOPY;  Surgeon: Reece Packer, MD;  Location: [facility];  Service: Urology;  Laterality: N/A;  . Pubovaginal sling N/A 04/30/2013    Procedure: REMOVAL OF VAGINAL MESH;  Surgeon: Reece Packer, MD;  Location: [facility];  Service: Urology;  Laterality: N/A;  . Abdominal hysterectomy    . Hernia repair  ?1996    "stomach"  . Cardiac catheterization  05-22-2008   DR SKAINS    NO SIGNIFECANT CAD/ NORMAL LV/  EF 65%/  NO WALL MOTION ABNORMALITIES  . Cardiac catheterization  02/12/2014  . Left heart catheterization with coronary angiogram N/A 02/12/2014    Procedure: LEFT HEART CATHETERIZATION WITH CORONARY ANGIOGRAM;  Surgeon: Candee Furbish, MD;  Location: [facility] CATH LAB;  Service: Cardiovascular;  Laterality: N/A;  . Laparoscopic sigmoid colectomy   04/22/2015  . Laparoscopic sigmoid colectomy N/A 04/22/2015    Procedure: LAPAROSCOPIC HAND ASSISTED SIGMOID COLECTOMY;  Surgeon: Erroll Luna, MD;  Location: [facility];  Service: General;  Laterality: N/A;    Family History  Problem Relation Age of Onset  . Hypertension Mother   . Diabetes Mother   . Cancer - Other Mother   . Cancer - Other Father   . Heart attack Father     Social History:  reports that she has been smoking Cigarettes and E-cigarettes.  She has a 41 pack-year smoking history. She has never used smokeless tobacco. She reports that she drinks alcohol. She reports that she does not use illicit drugs.  Allergies:  Allergies  Allergen Reactions  . Latex Itching, Rash and Other (See Comments)    Pt states she cannot use condoms - cause an infection.  Use of latex on skin is okay.  Tape causes rash  . Ibuprofen Other (See Comments)    headaches  . Sweetening Enhancer [Flavoring Agent] Nausea And Vomiting    And headaches  . Tetracyclines & Related Nausea And Vomiting    Yeast infections  . Trazodone And Nefazodone Other (See Comments)    Hallucinations   . Triazolam Other (See Comments)    hallucinations  . Aspartame And Phenylalanine Nausea And Vomiting    headaches    Medications: I have reviewed the patient's current medications.  Results for orders placed or performed during the hospital encounter of 05/04/15 (from the past 48 hour(s))  CBC with Differential     Status: Abnormal   Collection Time: 05/04/15  8:19 PM  Result Value Ref Range   WBC 13.6 (H) 4.0 - 10.5 K/uL   RBC 3.94 3.87 - 5.11 MIL/uL   Hemoglobin 11.6 (L) 12.0 - 15.0 g/dL   HCT 34.5 (L) 36.0 - 46.0 %   MCV 87.6 78.0 - 100.0 fL   MCH 29.4 26.0 - 34.0 pg   MCHC 33.6 30.0 - 36.0 g/dL   RDW 12.9 11.5 - 15.5 %   Platelets 337 150 - 400 K/uL   Neutrophils Relative % 76 43 - 77 %   Neutro Abs 10.3 (H) 1.7 - 7.7 K/uL   Lymphocytes Relative 20 12 - 46 %   Lymphs Abs 2.8 0.7 - 4.0 K/uL    Monocytes Relative 3 3 - 12 %   Monocytes Absolute 0.4 0.1 - 1.0 K/uL   Eosinophils Relative 0 0 - 5 %   Eosinophils Absolute 0.0 0.0 - 0.7 K/uL   Basophils Relative 1 0 - 1 %   Basophils Absolute 0.1 0.0 - 0.1 K/uL  Comprehensive metabolic panel     Status: Abnormal   Collection Time: 05/04/15  8:19 PM  Result Value Ref Range   Sodium 139 135 - 145 mmol/L   Potassium 3.4 (L) 3.5 - 5.1 mmol/L   Chloride 103 101 - 111 mmol/L   CO2 24 22 - 32 mmol/L   Glucose, Bld 225 (H) 70 - 99 mg/dL   BUN <5 (L) 6 - 20 mg/dL   Creatinine, Ser 0.95 0.44 - 1.00 mg/dL   Calcium 9.2 8.9 - 10.3 mg/dL   Total Protein 6.6 6.5 - 8.1 g/dL   Albumin 3.5 3.5 - 5.0 g/dL   AST 17 15 - 41 U/L   ALT 15 14 - 54 U/L   Alkaline Phosphatase 53 38 - 126 U/L   Total Bilirubin 0.4 0.3 - 1.2 mg/dL   GFR calc non Af Amer >60 >60 mL/min   GFR calc Af Amer >60 >60 mL/min    Comment: (NOTE) The eGFR has been calculated using the CKD EPI equation. This calculation has not been validated in all clinical situations. eGFR's persistently <60 mL/min signify possible Chronic Kidney Disease.    Anion gap 12 5 - 15    No results found.  Review of Systems  Constitutional: Negative for fever, chills and weight loss.  HENT: Negative for nosebleeds.   Eyes: Negative for blurred vision.  Respiratory: Negative for shortness of breath.   Cardiovascular: Negative for chest pain, palpitations, orthopnea and PND.       Denies DOE  Gastrointestinal: Negative for nausea, vomiting, diarrhea, constipation and blood in stool.  Genitourinary: Negative for dysuria and hematuria.  Musculoskeletal: Negative.   Skin: Negative for itching and rash.  Neurological: Negative for dizziness, focal weakness, seizures, loss of consciousness and headaches.       Denies TIAs, amaurosis fugax  Endo/Heme/Allergies: Does not bruise/bleed easily.  Psychiatric/Behavioral: The patient is not nervous/anxious.    Blood pressure 138/73, pulse 87,  temperature 98.2 F (36.8 C), temperature source Oral, resp. rate 16, height 5' 5" (1.651 m), weight 89.812 kg (198 lb), SpO2 98 %. Physical Exam  Vitals reviewed. Constitutional: She is oriented to person, place, and time. She appears well-developed and well-nourished. No distress.  obese  HENT:  Head: Normocephalic and atraumatic.  Right Ear: External ear normal.  Left Ear: External ear normal.  Eyes: Conjunctivae are normal. No scleral icterus.  Neck: Normal range of motion. Neck supple. No tracheal deviation present.  Cardiovascular: Normal rate and normal heart sounds.   Respiratory: Effort normal and breath sounds normal. No stridor. No respiratory distress. She has no wheezes.  GI: Soft. She exhibits no distension. There is no tenderness. There is no rebound and no guarding.    Suprapubic vertical incision skin is separated. In midportion of incision, small separation of subcu fat. No cellulitis, no induration; fascia appears intact when probed with qtip.   Musculoskeletal: She exhibits no edema or tenderness.  Neurological: She is alert and oriented to person, place, and time. She exhibits normal muscle tone.  Skin: Skin is warm and dry. No rash noted. She is not diaphoretic. No erythema. No pallor.  Psychiatric: She has a normal mood and affect. Her behavior is normal. Judgment and thought content normal.      Assessment/Plan: Skin incision separation S/p lap sigmoid colectomy 4/27 DM 2 HTN Bipolar Tobacco use  She is asymptomatic. The wound is clean. Fascia appears intact. Tolerating a diet. Think she can dc home with at least once  a day dressing changes. Went over wound care with pt.  Pt lives alone. Will have office contact tomorrow for nurse only visit to reinforce wound care. We will also try to arrange home health wound care if she qualifies. Discussed with PA. Pt voiced understanding  Leighton Ruff. Redmond Pulling, MD, FACS General, Bariatric, & Minimally Invasive  Surgery [facility], [place]   [facility] M 05/04/2015, 11:15 PM

## 2015-05-05 ENCOUNTER — Other Ambulatory Visit: Payer: Self-pay | Admitting: Endocrinology

## 2015-05-11 ENCOUNTER — Ambulatory Visit (INDEPENDENT_AMBULATORY_CARE_PROVIDER_SITE_OTHER): Payer: Medicare Other | Admitting: Endocrinology

## 2015-05-11 ENCOUNTER — Encounter: Payer: Self-pay | Admitting: Endocrinology

## 2015-05-11 VITALS — BP 118/64 | HR 90 | Temp 98.6°F | Resp 16 | Ht 62.0 in | Wt 199.4 lb

## 2015-05-11 DIAGNOSIS — E1142 Type 2 diabetes mellitus with diabetic polyneuropathy: Secondary | ICD-10-CM | POA: Diagnosis not present

## 2015-05-11 DIAGNOSIS — E876 Hypokalemia: Secondary | ICD-10-CM | POA: Diagnosis not present

## 2015-05-11 DIAGNOSIS — E1165 Type 2 diabetes mellitus with hyperglycemia: Secondary | ICD-10-CM

## 2015-05-11 DIAGNOSIS — IMO0002 Reserved for concepts with insufficient information to code with codable children: Secondary | ICD-10-CM

## 2015-05-11 NOTE — Progress Notes (Signed)
Patient ID: [name], female   DOB: [birth year], 49 y.o.   MRN: [id number]   Reason for Appointment: Diabetes follow-up    History of Present Illness:   Diagnosis: Type 2 diabetes mellitus, date of diagnosis: 2004.  PAST history: She has been treated mostly with insulin since about a year after diagnosis. She has had difficulty with consistent compliance with diet and also compliance with self care including glucose monitoring over the years. She had been mostly treated with basal insulin. Also had been tried on mealtime insulin but she would be noncompliant with this. Was tried on Prandin for mealtime control but difficult to judge efficacy because of lack of postprandial monitoring. Was given Victoza to start in 2011 but did not follow up after this.She was tried on premixed insulin but this did not help her control, mostly because of noncompliance with the doses. She had been using the V- go pump and had better control initially and was better compliant with the daily routine and boluses. She has had frequent education visits also. She stopped using her V.-go pump in 6/15 because of discomfort at the site of application   RECENT history:    INSULIN dose: Lantus 50 units qd    Her A1c has been consistently under 7% for the last 3 times. She is again not checking blood sugars except rarely at home and difficult to assess her recent level of control Her PCP has started her on TRULICITY A999333 mg weekly over the last 5-6 weeks; however she does not think she has any change in her satiety or craving for her drinks with sugar; also has not lost any weight or needed to change her insulin dose Current problems:  She has only 2 readings in the last month in the morning including one from today  She continues to drink regular soft drinks, mostly [facility], and will drink a 2 L bottle everyday  However she thinks that her blood sugar was not high when she drank some before her  fasting glucose this morning  Has only one reading after evening meal and this was 180  She does not like to exercise and has difficulty with leg pains  No hypoglycemia with current regimen  Her weight has not changed, has not lost any weight since starting Trulicity   Glucometer:  FreeStyle.  Checking blood sugar about once or twice a day now Blood Glucose readings:  Morning = 98, 128 and nonfasting 180 Hypoglycemia frequency: Never.    Food preferences: eating 1 or 2 meals per day, variable intake, sandwiches at times or otherwise snacks  Physical activity: exercise: Minimal.  She has difficulties with leg pain Certified Diabetes Educator visit: Most recent:, 2/14.  The diet that the patient has been following is no specific diet; still eating at irregular times; still drinking drinks with sugar claiming she is allergic to artificial sweeteners   Wt Readings from Last 3 Encounters:  05/11/15 199 lb 6.4 oz (90.447 kg)  05/04/15 198 lb (89.812 kg)  04/22/15 196 lb (88.905 kg)   DM labs:  Lab Results  Component Value Date   HGBA1C 6.6* 04/14/2015   HGBA1C 6.8* 03/09/2015   HGBA1C 6.5 11/05/2014   Lab Results  Component Value Date   MICROALBUR 1.0 11/05/2014   LDLCALC 40 11/05/2014   CREATININE 0.95 0000000    Complications: are: peripheral neuropathy and she is still fairly symptomatic with this.       Medication List  This list is accurate as of: 05/11/15  9:05 PM.  Always use your most recent med list.               ALPRAZolam 1 MG tablet  Commonly known as:  XANAX  Take 1 tablet (1 mg total) by mouth at bedtime as needed for anxiety.     aspirin 325 MG tablet  Take 1 tablet (325 mg total) by mouth daily.     atorvastatin 40 MG tablet  Commonly known as:  LIPITOR  TAKE 1 TABLET (40 MG TOTAL) BY MOUTH DAILY FOR CHOLESTEROL.     Vortioxetine HBr 5 MG Tabs  Take 5 mg by mouth daily.     BRINTELLIX 20 MG Tabs  Generic drug:  Vortioxetine HBr    Take 20 mg by mouth daily. Takes with the 5mg  tablet for a total dose of 25mg      chlorproMAZINE 200 MG tablet  Commonly known as:  THORAZINE  Take 200 mg by mouth at bedtime.     diazepam 10 MG tablet  Commonly known as:  VALIUM  Take 20 mg by mouth at bedtime.     esomeprazole 40 MG capsule  Commonly known as:  NEXIUM  Take 40 mg by mouth 2 (two) times daily before a meal.     fenofibrate 145 MG tablet  Commonly known as:  TRICOR  Take 1 tablet (145 mg total) by mouth daily.     furosemide 20 MG tablet  Commonly known as:  LASIX  Take 20 mg by mouth daily.     insulin aspart 100 UNIT/ML FlexPen  Commonly known as:  NOVOLOG FLEXPEN  Inject 12 Units into the skin 3 (three) times daily with meals.     INVOKANA 100 MG Tabs tablet  Generic drug:  canagliflozin  TAKE 1 TABLET (100 MG TOTAL) BY MOUTH DAILY.     LANTUS SOLOSTAR 100 UNIT/ML Solostar Pen  Generic drug:  Insulin Glargine  INJECT 46 UNITS DAILY AT THE SAME TIME EVERY DAY. IF SUGAR GOES ABOVE 200 INCREASE DOSE TO 50     oxyCODONE 5 MG immediate release tablet  Commonly known as:  Oxy IR/ROXICODONE  Take 5 mg by mouth daily.     oxyCODONE-acetaminophen 10-325 MG per tablet  Commonly known as:  PERCOCET  Take 1 tablet by mouth 3 (three) times daily as needed for pain.     RAPAFLO 8 MG Caps capsule  Generic drug:  silodosin  Take 8 mg by mouth daily.     rizatriptan 10 MG tablet  Commonly known as:  MAXALT  Take 1 tablet (10 mg total) by mouth as needed for migraine. May repeat in 2 hours if needed     spironolactone 25 MG tablet  Commonly known as:  ALDACTONE  Take 2 tablets (50 mg total) by mouth 2 (two) times daily.     tizanidine 2 MG capsule  Commonly known as:  ZANAFLEX  2 mg po qhs for one week then increase to 4mg  po qhs     tiZANidine 4 MG tablet  Commonly known as:  ZANAFLEX  Take 8 mg by mouth daily.     TRULICITY A999333 0000000 Sopn  Generic drug:  Dulaglutide  Take 0.75 mg by mouth once  a week.        Allergies:  Allergies  Allergen Reactions  . Latex Itching, Rash and Other (See Comments)    Pt states she cannot use condoms - cause an infection.  Use  of latex on skin is okay.  Tape causes rash  . Ibuprofen Other (See Comments)    headaches  . Sweetening Enhancer [Flavoring Agent] Nausea And Vomiting    And headaches  . Tetracyclines & Related Nausea And Vomiting    Yeast infections  . Trazodone And Nefazodone Other (See Comments)    Hallucinations   . Triazolam Other (See Comments)    hallucinations  . Aspartame And Phenylalanine Nausea And Vomiting    headaches    Past Medical History  Diagnosis Date  . Hypertension   . Hyperlipemia   . GERD (gastroesophageal reflux disease)   . Insomnia   . SUI (stress urinary incontinence, female) S/P SLING 12-29-2011  . SOB (shortness of breath) on exertion   . Bipolar disorder   . Neurogenic bladder   . Hypercholesterolemia   . Gastroparesis   . Ulcer   . MRSA (methicillin resistant Staphylococcus aureus)   . Chronic pain   . Edema   . Chest pain     a. 2008 Cath: normal cors;  b. 12/2013 Lexi CL: EF 47%, no ischemia/infarct.  . Anxiety   . Vertigo   . COPD (chronic obstructive pulmonary disease)   . Cholecystitis   . Hypersomnia   . Tobacco abuse   . Claudication     a. 12/2013 ABI's: R 0.97, L 0.94.  [name] Palpitations   . Anginal pain   . Pneumonia     "twice, I think" (02/12/2014)  . Sleep apnea     didn't tolerate cpap (02/12/2014)  . Type II diabetes mellitus   . History of blood transfusion 1986    "related to lost a child" (02/12/2014)  . H/O hiatal hernia   . [id number])     "weekly" (02/12/2014)  . Stroke 02/12/2014    "eyesight is messed up; not steady on my feet" (02/12/2014)  . Arthritis     "qwhere" (02/12/2014)  . DJD (degenerative joint disease)   . Chronic back pain   . Depression   . Personality disorder   . OSA (obstructive sleep apnea) 07/04/2014    Past Surgical History    Procedure Laterality Date  . Knee arthroscopy w/ allograft impant Left     graft x 2  . Foot surgery Bilateral   . Anterior cervical decomp/discectomy fusion  2000    C6 - 7  . Pubovaginal sling  12/29/2011    Procedure: Gaynelle Arabian;  Surgeon: Reece Packer, MD;  Location: [facility];  Service: Urology;  Laterality: N/A;  cysto and sparc sling   . Lumbar fusion      "cage in my spine"  . Total abdominal hysterectomy w/ bilateral salpingoophorectomy  1997  . Multiple laparoscopies for endometriosis    . Cesarean section  1989; 1992  . Repeat reconstruction acl left knee/ screws removed  03-28-2000    CADAVER GRAFT  . 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 Knee surgery      TOTAL 8 SURG'S  . Cystoscopy with injection  05/04/2012    Procedure: CYSTOSCOPY WITH INJECTION;  Surgeon: Reece Packer, MD;  Location: [facility];  Service: Urology;  Laterality: N/A;  MACROPLASTIQUE INJECTION  . Cystoscopy with injection  08/28/2012    Procedure: CYSTOSCOPY WITH INJECTION;  Surgeon: Reece Packer, MD;  Location: [facility];  Service: Urology;  Laterality: N/A;  cysto and macroplastique   . Carpal tunnel release Right 04-25-2013  . Cysto N/A 04/30/2013  Procedure: CYSTOSCOPY;  Surgeon: Reece Packer, MD;  Location: [facility];  Service: Urology;  Laterality: N/A;  . Pubovaginal sling N/A 04/30/2013    Procedure: REMOVAL OF VAGINAL MESH;  Surgeon: Reece Packer, MD;  Location: [facility];  Service: Urology;  Laterality: N/A;  . Abdominal hysterectomy    . Hernia repair  ?1996    "stomach"  . Cardiac catheterization  05-22-2008   DR SKAINS    NO SIGNIFECANT CAD/ NORMAL LV/  EF 65%/  NO WALL MOTION ABNORMALITIES  . Cardiac catheterization  02/12/2014  . Left heart catheterization with coronary angiogram N/A 02/12/2014    Procedure: LEFT HEART CATHETERIZATION WITH CORONARY ANGIOGRAM;  Surgeon: Candee Furbish, MD;  Location: [facility]  CATH LAB;  Service: Cardiovascular;  Laterality: N/A;  . Laparoscopic sigmoid colectomy  04/22/2015  . Laparoscopic sigmoid colectomy N/A 04/22/2015    Procedure: LAPAROSCOPIC HAND ASSISTED SIGMOID COLECTOMY;  Surgeon: Erroll Luna, MD;  Location: [facility];  Service: General;  Laterality: N/A;    Family History  Problem Relation Age of Onset  . Hypertension Mother   . Diabetes Mother   . Cancer - Other Mother   . Cancer - Other Father   . Heart attack Father     Social History:  reports that she has been smoking Cigarettes and E-cigarettes.  She has a 41 pack-year smoking history. She has never used smokeless tobacco. She reports that she drinks alcohol. She reports that she does not use illicit drugs.  Review of Systems -   She has history of high triglycerides. She was prescribed fenofibrate but she does not know if she is taking this  Also she is taking Lipitor    Lab Results  Component Value Date   CHOL 121 03/09/2015   HDL 37.90* 03/09/2015   LDLCALC 40 11/05/2014   LDLDIRECT 57.0 03/09/2015   TRIG 368.0* 03/09/2015   CHOLHDL 3 03/09/2015    Hypertension: This has been relatively mild recently . She is taking spironolactone twice a day Her blood pressure was relatively low on her last visit but she continues to take the same dose Not taking ramipril now  HYPOKALEMIA: Currently not on potassium supplement.  Her potassium was 3.4 in the ER earlier this month  Painful neuropathy: She has had continued symptoms including leg pains and numbness as well as difficulty with balance and has tried various drugs including gabapentin without much relief   Examination:   BP 118/64 mmHg  Pulse 90  Temp(Src) 98.6 F (37 C)  Resp 16  Ht 5\' 2"  (1.575 m)  Wt 199 lb 6.4 oz (90.447 kg)  BMI 36.46 kg/m2  SpO2 98%  Body mass index is 36.46 kg/(m^2).   She has mild facial puffiness without swelling of the eyes and no rash on the face  No ankle edema  Foot exam shows absent pedal  pulses and distal sensation  Assesment/Plan:   1. Diabetes type 2, with fair control   The patient's diabetes is relatively well controlled compared to her previous levels with A1c being below 7% consistently now She probably has benefited significantly from using Invokana Not clear why her PCP has started her on Trulicity as control has not been poor and she does not appear to be benefiting from this in any way with changing her diet, weight loss or requirement for insulin dose She continues to be noncompliant with diet and did not switch to Gatorade as directed, continues to drink 2 L of regular soft drinks  Also checking blood sugars rarely despite reminders Currently not exercising, has severe neuropathy  Recommend that she be taken off Trulicity as it is not helping and would make her get into the doughnut hole for her Medicare She will discuss with PCP  2. Hypertension: Blood pressure is  normal and she can continue 50 mg Aldactone only as the sole treatment  3.  Hypokalemia: This is usually controlled with taking Aldactone but her potassium was low earlier this month soon after her abdominal surgery Will need to check this again today and decide on any supplements  4.  Facial swelling: Not clear of the etiology.  She does not have generalized edema and does not look cushingoid.  No evidence of hypothyroidism  5.  HYPERLIPIDEMIA: We will need to make sure she is taking fenofibrate as triglycerides are over 300  Counseling time on subjects discussed above is over 50% of today's 25 minute visit  Jac Romulus 05/11/2015, 9:05 PM

## 2015-05-12 LAB — COMPREHENSIVE METABOLIC PANEL
ALT: 14 U/L (ref 0–35)
AST: 19 U/L (ref 0–37)
Albumin: 3.8 g/dL (ref 3.5–5.2)
Alkaline Phosphatase: 49 U/L (ref 39–117)
BILIRUBIN TOTAL: 0.2 mg/dL (ref 0.2–1.2)
BUN: 10 mg/dL (ref 6–23)
CO2: 28 meq/L (ref 19–32)
CREATININE: 0.97 mg/dL (ref 0.40–1.20)
Calcium: 9.4 mg/dL (ref 8.4–10.5)
Chloride: 102 mEq/L (ref 96–112)
GFR: 64.62 mL/min (ref >60.00)
Glucose, Bld: 124 mg/dL — ABNORMAL HIGH (ref 70–99)
Potassium: 3.7 mEq/L (ref 3.5–5.1)
Sodium: 136 mEq/L (ref 135–145)
Total Protein: 6.6 g/dL (ref 6.0–8.3)

## 2015-05-12 NOTE — Progress Notes (Signed)
Quick Note:  Please let patient know that the lab result is normal and no further action needed ______ 

## 2015-05-15 ENCOUNTER — Telehealth: Payer: Self-pay | Admitting: Hematology

## 2015-05-15 ENCOUNTER — Encounter: Payer: Self-pay | Admitting: *Deleted

## 2015-05-15 NOTE — Telephone Encounter (Signed)
new patient appt- s/w patient and gve np appt for 06/13 @ 11 w/Dr. [name]. Dx- colon ca

## 2015-06-05 ENCOUNTER — Other Ambulatory Visit: Payer: Self-pay | Admitting: Endocrinology

## 2015-06-05 ENCOUNTER — Telehealth: Payer: Self-pay | Admitting: *Deleted

## 2015-06-05 NOTE — Telephone Encounter (Signed)
Oncology Nurse Navigator Documentation  Oncology Nurse Navigator Flowsheets 06/05/2015  Referral date to RadOnc/MedOnc 05/14/2015  Navigator Encounter Type Introductory phone call--confirmed her appointment with Dr. [name] for 06/08/15. Made her aware of free valet parking at entrance. She confirms appointment.

## 2015-06-08 ENCOUNTER — Encounter: Payer: Self-pay | Admitting: *Deleted

## 2015-06-08 ENCOUNTER — Telehealth: Payer: Self-pay | Admitting: Hematology

## 2015-06-08 ENCOUNTER — Encounter: Payer: Self-pay | Admitting: Hematology

## 2015-06-08 ENCOUNTER — Ambulatory Visit (HOSPITAL_BASED_OUTPATIENT_CLINIC_OR_DEPARTMENT_OTHER): Payer: Medicare Other | Admitting: Hematology

## 2015-06-08 ENCOUNTER — Ambulatory Visit: Payer: Medicare Other

## 2015-06-08 VITALS — BP 123/66 | HR 87 | Temp 97.7°F | Resp 18 | Ht 62.0 in | Wt 203.6 lb

## 2015-06-08 DIAGNOSIS — C189 Malignant neoplasm of colon, unspecified: Secondary | ICD-10-CM | POA: Diagnosis present

## 2015-06-08 DIAGNOSIS — Z8673 Personal history of transient ischemic attack (TIA), and cerebral infarction without residual deficits: Secondary | ICD-10-CM | POA: Diagnosis not present

## 2015-06-08 DIAGNOSIS — Z720 Tobacco use: Secondary | ICD-10-CM

## 2015-06-08 DIAGNOSIS — I10 Essential (primary) hypertension: Secondary | ICD-10-CM

## 2015-06-08 DIAGNOSIS — E119 Type 2 diabetes mellitus without complications: Secondary | ICD-10-CM

## 2015-06-08 DIAGNOSIS — F329 Major depressive disorder, single episode, unspecified: Secondary | ICD-10-CM | POA: Diagnosis not present

## 2015-06-08 MED ORDER — NICOTINE 14 MG/24HR TD PT24: 14.0000 mg | MEDICATED_PATCH | Freq: Every day | TRANSDERMAL | Status: DC

## 2015-06-08 NOTE — [facility] Oncology Navigator Note (Signed)
Oncology Nurse Navigator Documentation  Oncology Nurse Navigator Flowsheets 06/08/2015  Referral date to RadOnc/MedOnc -  Navigator Encounter Type Initial MedOnc  Patient Visit Type Medonc  Treatment Phase S/P surgery-  Barriers/Navigation Needs Education-Healthy Living  Support Groups/Services GI  Time Spent with Patient 45  Met with patient and during new patient visit. Explained the role of the GI Nurse Navigator and provided New Patient Packet with information on: 1.  Colon cancer 2. Support groups 3. Smoking Cessation classes and Tips to Quit Smoking 4. Fall Safety Plan Answered questions, reviewed current treatment plan using TEACH back and provided emotional support. Provided copy of current treatment plan.   Merceda Elks, RN, BSN GI Oncology [place]

## 2015-06-08 NOTE — Progress Notes (Signed)
Checked in new pt with no financial concerns.  Pt has 2 insurances so financial assistance may not be needed but she has my card for any billing questions or concerns.  ° °

## 2015-06-08 NOTE — Telephone Encounter (Signed)
Gave and pritned appt sched and avs for pt for SEPT

## 2015-06-08 NOTE — Progress Notes (Signed)
[place]  [phone] Fax:([phone]) [phone]  Clinic New Consult Note   Patient Care Team: Tamsen Roers, MD as PCP - General (Family Medicine) Elayne Snare, MD as Consulting Physician (Endocrinology) 06/08/2015  CHIEF COMPLAINTS/PURPOSE OF CONSULTATION:  Stage I colon cancer   Oncology History   Colon cancer   Staging form: Colon and Rectum, AJCC 7th Edition     Pathologic stage from 05/16/2015: Stage I (T1, N0, cM0) - Signed by Truitt Merle, MD on 06/08/2015       Colon cancer   02/26/2015 Initial Diagnosis Sigmoid Colon cancer   02/26/2015 Procedure Colonoscopy showed internal hemorrhoids, 2 polyps in the transverse and ileocecal valve. There is a ulcerated polypoid lesion in the sigmoid colon which was biopsied.   03/02/2015 Tumor Marker CEA= 2.5   03/06/2015 Imaging CT chest, abdomen and pelvis with contrast showed no evidence of metastatic disease.   04/22/2015 Surgery Sigmoid colon segmental resection, showed grade 2 adenocarcinoma, T1, 1.3 cm, 10 lymph nodes were negative, no lymphovascular invasion, no perineural invasion.     HISTORY OF PRESENTING ILLNESS:  [name] 49 y.o. female with the multiple medical comorbidities, including diabetes, hypertension, depression and stroke in 2015, is here because of recently diagnosed colon cancer.  She has chronic constipation, no nausea, abdominal pain or diarrhea. She denies melena or hematochezia. She was referred to see GI Dr. Paulita Fujita, and underwent a colonoscopy on 02/26/2015. The colonoscopy showed internal hemorrhoids, 2 polyps in the transverse and ileocecal valve. A ulcerated polypoid lesion in the sigmoid colon was seen and biopsied, which showed adenocarcinoma. She was referred to  surgeon Dr. Brantley Stage, and underwent laparoscopic sigmoid colon segmental resection on 04/22/2015.  She has recovered well from surgery. She was discharged home after surgery. She had ED visit on 05/04/2015 for wound dehiscence  after staple removal. She has been changing the dressing every other day at home. She has appointment to see Dr. Brantley Stage this abdomen.  She denies any significant pain, BM is normal. No other new complains. She has diabetic neuropathy in hands and feet, which is stable. She lives alone, does not have much social support. She appears to be depressed with a very flat facial expression, but she denies suicidal ideas.   MEDICAL HISTORY:  Past Medical History  Diagnosis Date  . Hypertension   . Hyperlipemia   . GERD (gastroesophageal reflux disease)   . Insomnia   . SUI (stress urinary incontinence, female) S/P SLING 12-29-2011  . SOB (shortness of breath) on exertion   . Bipolar disorder   . Neurogenic bladder   . Hypercholesterolemia   . Gastroparesis   . Ulcer   . MRSA (methicillin resistant Staphylococcus aureus)   . Chronic pain   . Edema   . Chest pain     a. 2008 Cath: normal cors;  b. 12/2013 Lexi CL: EF 47%, no ischemia/infarct.  . Anxiety   . Vertigo   . COPD (chronic obstructive pulmonary disease)   . Cholecystitis   . Hypersomnia   . Tobacco abuse   . Claudication     a. 12/2013 ABI's: R 0.97, L 0.94.  [name] Palpitations   . Anginal pain   . Pneumonia     "twice, I think" (02/12/2014)  . Sleep apnea     didn't tolerate cpap (02/12/2014)  . Type II diabetes mellitus   . History of blood transfusion 1986    "related to lost a child" (02/12/2014)  . H/O hiatal hernia   .  LPFXTKWI(097.3)     "weekly" (02/12/2014)  . Stroke 02/12/2014    "eyesight is messed up; not steady on my feet" (02/12/2014)  . Arthritis     "qwhere" (02/12/2014)  . DJD (degenerative joint disease)   . Chronic back pain   . Depression   . Personality disorder   . OSA (obstructive sleep apnea) 07/04/2014    SURGICAL HISTORY: Past Surgical History  Procedure Laterality Date  . Knee arthroscopy w/ allograft impant Left     graft x 2  . Foot surgery Bilateral   . Anterior cervical decomp/discectomy  fusion  2000    C6 - 7  . Pubovaginal sling  12/29/2011    Procedure: Gaynelle Arabian;  Surgeon: Reece Packer, MD;  Location: [facility];  Service: Urology;  Laterality: N/A;  cysto and sparc sling   . Lumbar fusion      "cage in my spine"  . Total abdominal hysterectomy w/ bilateral salpingoophorectomy  1997  . Multiple laparoscopies for endometriosis    . Cesarean section  1989; 1992  . Repeat reconstruction acl left knee/ screws removed  03-28-2000    CADAVER GRAFT  . 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 Knee surgery      TOTAL 8 SURG'S  . Cystoscopy with injection  05/04/2012    Procedure: CYSTOSCOPY WITH INJECTION;  Surgeon: Reece Packer, MD;  Location: [facility];  Service: Urology;  Laterality: N/A;  MACROPLASTIQUE INJECTION  . Cystoscopy with injection  08/28/2012    Procedure: CYSTOSCOPY WITH INJECTION;  Surgeon: Reece Packer, MD;  Location: [facility];  Service: Urology;  Laterality: N/A;  cysto and macroplastique   . Carpal tunnel release Right 04-25-2013  . Cysto N/A 04/30/2013    Procedure: CYSTOSCOPY;  Surgeon: Reece Packer, MD;  Location: [facility];  Service: Urology;  Laterality: N/A;  . Pubovaginal sling N/A 04/30/2013    Procedure: REMOVAL OF VAGINAL MESH;  Surgeon: Reece Packer, MD;  Location: [facility];  Service: Urology;  Laterality: N/A;  . Abdominal hysterectomy    . Hernia repair  ?1996    "stomach"  . Cardiac catheterization  05-22-2008   DR SKAINS    NO SIGNIFECANT CAD/ NORMAL LV/  EF 65%/  NO WALL MOTION ABNORMALITIES  . Cardiac catheterization  02/12/2014  . Left heart catheterization with coronary angiogram N/A 02/12/2014    Procedure: LEFT HEART CATHETERIZATION WITH CORONARY ANGIOGRAM;  Surgeon: Candee Furbish, MD;  Location: [facility];  Service: Cardiovascular;  Laterality: N/A;  . Laparoscopic sigmoid colectomy  04/22/2015  . Laparoscopic sigmoid colectomy N/A 04/22/2015     Procedure: LAPAROSCOPIC HAND ASSISTED SIGMOID COLECTOMY;  Surgeon: Erroll Luna, MD;  Location: [facility];  Service: General;  Laterality: N/A;    SOCIAL HISTORY: History   Social History  . Marital Status: Divorced    Spouse Name: N/A  . Number of Children: 2  . Years of Education: 6th   Occupational History  . Not on file.   Social History Main Topics  . Smoking status: Current Every Day Smoker -- 1.00 packs/day for 41 years    Types: Cigarettes, E-cigarettes  . Smokeless tobacco: Never Used  . Alcohol Use: 0.0 oz/week     Comment: 02/12/2014 "might have a mixed drink on special occasions"  . Drug Use: No  . Sexual Activity: Yes   Other Topics Concern  . Not on file   Social History Narrative  Patient is divorced and lives alone.   Patient has two adult children.   Patient is disabled.   Patient has a 6th grade education.   Patient is right-handed.   Patient drinks 2- 3 liters of soda and drinks tea but not everyday.    Does not routinely exercise.    FAMILY HISTORY: Family History  Problem Relation Age of Onset  . Hypertension Mother   . Diabetes Mother   . Cancer - Other Mother     lymphoma   . Cancer - Other Father     lung, bladder cancer   . Heart attack Father   . Cancer - Other Brother      bladder cancer     ALLERGIES:  is allergic to latex; ibuprofen; sweetening enhancer; tetracyclines & related; trazodone and nefazodone; triazolam; and aspartame and phenylalanine.  MEDICATIONS:  Current Outpatient Prescriptions  Medication Sig Dispense Refill  . atorvastatin (LIPITOR) 40 MG tablet TAKE 1 TABLET (40 MG TOTAL) BY MOUTH DAILY FOR CHOLESTEROL. 30 tablet 3  . BRINTELLIX 20 MG TABS Take 20 mg by mouth daily. Takes with the 65m tablet for a total dose of 273m   . chlorproMAZINE (THORAZINE) 200 MG tablet Take 200 mg by mouth at bedtime.    . diazepam (VALIUM) 10 MG tablet Take 20 mg by mouth at bedtime.     . Marland Kitchensomeprazole (NEXIUM) 40 MG capsule Take 40 mg by mouth 2 (two) times daily before a meal.    . fenofibrate (TRICOR) 145 MG tablet TAKE 1 TABLET (145 MG TOTAL) BY MOUTH DAILY. 30 tablet 5  . INVOKANA 100 MG TABS tablet TAKE 1 TABLET (100 MG TOTAL) BY MOUTH DAILY. 30 tablet 5  . LANTUS SOLOSTAR 100 UNIT/ML Solostar Pen INJECT 46 UNITS DAILY AT THE SAME TIME EVERY DAY. IF SUGAR GOES ABOVE 200 INCREASE DOSE TO 50 (Patient taking differently: INJECT 50 UNITS DAILY AT THE SAME TIME EVERY DAY. IF SUGAR GOES ABOVE 200 INCREASE DOSE TO 50) 45 mL 2  . oxyCODONE (OXY IR/ROXICODONE) 5 MG immediate release tablet Take 5 mg by mouth daily.     . Marland KitchenxyCODONE-acetaminophen (PERCOCET) 10-325 MG per tablet Take 1 tablet by mouth 3 (three) times daily as needed for pain.    . Marland Kitchenpironolactone (ALDACTONE) 25 MG tablet Take 2 tablets (50 mg total) by mouth 2 (two) times daily. 60 tablet 2  . tiZANidine (ZANAFLEX) 4 MG tablet Take 8 mg by mouth daily.  1  . Vortioxetine HBr 5 MG TABS Take 5 mg by mouth daily.    . Marland Kitchenspirin 325 MG tablet Take 1 tablet (325 mg total) by mouth daily. (Patient not taking: Reported on 06/08/2015)    . insulin aspart (NOVOLOG FLEXPEN) 100 UNIT/ML FlexPen Inject 12 Units into the skin 3 (three) times daily with meals. (Patient not taking: Reported on 04/10/2015) 15 mL 3  .  nicotine (NICODERM CQ) 14 mg/24hr patch Place 1 patch (14 mg total) onto the skin daily. 28 patch 1  . RAPAFLO 8 MG CAPS capsule Take 8 mg by mouth daily.     . rizatriptan (MAXALT) 10 MG tablet Take 1 tablet (10 mg total) by mouth as needed for migraine. May repeat in 2 hours if needed (Patient not taking: Reported on 06/08/2015) 10 tablet 3   No current facility-administered medications for this visit.    REVIEW OF SYSTEMS:   Constitutional: Denies fevers, chills or abnormal night sweats Eyes: Denies blurriness of vision, double vision or watery  eyes Ears, nose, mouth, throat, and face: Denies mucositis or sore throat Respiratory: Denies cough, dyspnea or wheezes Cardiovascular: Denies palpitation, chest discomfort or lower extremity swelling Gastrointestinal:  Denies nausea, heartburn or change in bowel habits Skin: Denies abnormal skin rashes Lymphatics: Denies new lymphadenopathy or easy bruising Neurological:Denies numbness, tingling or new weaknesses Behavioral/Psych: Mood is stable, no new changes  All other systems were reviewed with the patient and are negative.  PHYSICAL EXAMINATION: ECOG PERFORMANCE STATUS: 1 - Symptomatic but completely ambulatory  Filed Vitals:   06/08/15 1118  BP: 123/66  Pulse: 87  Temp: 97.7 F (36.5 C)  Resp: 18   Filed Weights   06/08/15 1118  Weight: 203 lb 9.6 oz (92.352 kg)    GENERAL:alert, no distress and comfortable SKIN: skin color, texture, turgor are normal, no rashes or significant lesions EYES: normal, conjunctiva are pink and non-injected, sclera clear OROPHARYNX:no exudate, no erythema and lips, buccal mucosa, and tongue normal  NECK: supple, thyroid normal size, non-tender, without nodularity LYMPH:  no palpable lymphadenopathy in the cervical, axillary or inguinal LUNGS: clear to auscultation and percussion with normal breathing effort HEART: regular rate & rhythm and no murmurs and no lower extremity edema ABDOMEN:abdomen  soft, no abdomen midline surgical wound is covered by gauze. Non-tender and normal bowel sounds Musculoskeletal:no cyanosis of digits and no clubbing  PSYCH: alert & oriented x 3 with fluent speech NEURO: no focal motor/sensory deficits  LABORATORY DATA:  I have reviewed the data as listed CBC Latest Ref Rng 05/04/2015 04/23/2015 04/20/2015  WBC 4.0 - 10.5 K/uL 13.6(H) 15.3(H) 11.1(H)  Hemoglobin 12.0 - 15.0 g/dL 11.6(L) 12.1 12.6  Hematocrit 36.0 - 46.0 % 34.5(L) 37.1 38.4  Platelets 150 - 400 K/uL 337 231 244    CMP Latest Ref Rng 05/11/2015 05/04/2015 04/24/2015  Glucose 70 - 99 mg/dL 124(H) 225(H) 228(H)  BUN 6 - 23 mg/dL 10 <5(L) <5(L)  Creatinine 0.40 - 1.20 mg/dL 0.97 0.95 0.91  Sodium 135 - 145 mEq/L 136 139 136  Potassium 3.5 - 5.1 mEq/L 3.7 3.4(L) 3.8  Chloride 96 - 112 mEq/L 102 103 103  CO2 19 - 32 mEq/L '28 24 25  ' Calcium 8.4 - 10.5 mg/dL 9.4 9.2 8.3(L)  Total Protein 6.0 - 8.3 g/dL 6.6 6.6 -  Total Bilirubin 0.2 - 1.2 mg/dL 0.2 0.4 -  Alkaline Phos 39 - 117 U/L 49 53 -  AST 0 - 37 U/L 19 17 -  ALT 0 - 35 U/L 14 15 -     PATHOLOGY REPORT Diagnosis 04/22/2015 1. Colon, segmental resection for tumor, Sigmoid - COLONIC ADENOCARCINOMA EXTENDING INTO SUBMUCOSA, 1.3 CM - MARGINS NOT INVOLVED. - NINE BENIGN LYMPH NODES (0/9). 2. Colon, segmental resection, Sigmoid - FOCAL MESENTERIC FAT NECROSIS WITH HEMORRHAGE AND FIBROSIS. - NO EVIDENCE OF MALIGNANCY. - ONE BENIGN LYMPH NODE (0/1). 3. Colon, resection margin (donut), Proximal donut - BENIGN COLON TISSUE. - NO EVIDENCE OF MALIGNANCY. 4. Colon, resection margin (donut), Distal donut - BENIGN COLON TISSUE. - NO EVIDENCE OF MALIGNANCY.  Microscopic Comment 1. COLON AND RECTUM (INCLUDING TRANS-ANAL RESECTION): Specimen: Sigmoid colon with proximal and distal donut margins. Procedure: Segmental resection with proximal and distal donut margins. Tumor site: Sigmoid Specimen integrity: Previously opened Macroscopic  intactness of mesorectum: Incomplete Macroscopic tumor perforation: No Invasive tumor: Maximum size: 1.3 cm Histologic type(s): Colonic adenocarcinoma Histologic grade and differentiation: G2: moderately differentiated/low grade Type of polyp in which invasive carcinoma arose: No residual polyp Microscopic extension of invasive tumor: Into  submucosa Lymph-Vascular invasion: N Peri-neural invasion: No Tumor deposit(s) (discontinuous extramural extension): No Resection margins: Proximal margin: Free of tumor Distal margin: Free of tumor Circumferential (radial) (posterior ascending, posterior descending; lateral and posterior mid-rectum; and entire lower 1/3 rectum): N/A Mesenteric margin (sigmoid and transverse): Free of tumor Distance closest margin (if all above margins negative): N/A Treatment effect (neo-adjuvant therapy): No Additional polyp(s): No Non-neoplastic findings: Focal mesenteric adipose tissue fat necrosis with hemorrhage and fibrosis. Lymph nodes: number examined 10; number positive: 0 Pathologic Staging: pT1, pN0, pMX Ancillary studies: Pending. (JDP:kh 4 -28-16)  RADIOGRAPHIC STUDIES: I have personally reviewed the radiological images as listed and agreed with the findings in the report.  CT chest, abdomen and pelvis w contrast on 03/06/2015  IMPRESSION: 1. No findings to suggest metastatic disease in the chest, abdomen or pelvis. 2. Diffuse thickening of the colonic wall in the region of the sigmoid colon. Typically this is seen in the setting of acute colitis, however, there are no surrounding inflammatory changes and no definite hypervascularity in the sigmoid mesocolon on today's examination. This may represent sequela of prior episodes of recurrent sigmoid colitis. No discrete mass is identified in this region, but underlying neoplasm in this area is not excluded. 3. Normal appendix. 4. Atherosclerosis, including left anterior descending coronary artery  disease. Please note that although the presence of coronary artery calcium documents the presence of coronary artery disease, the severity of this disease and any potential stenosis cannot be assessed on this non-gated CT examination. Assessment for potential risk factor modification, dietary therapy or pharmacologic therapy may be warranted, if clinically indicated. 5. Additional incidental findings, as above.  ASSESSMENT & PLAN: 50 year old Caucasian female, with multiple medical comorbidities, who was found to have a stage I sigmoid colon cancer.  1. Stage I sigmoid colon adenocarcinoma, pT1N0M0, moderately differentiated, MMR normall  -I reviewed her surgical pathology findings with patient in great details. She appears to have very early stage colon cancer, no lymphovascular invasion, no perineural invasion or other high risk features. Her surgical margins were negative.  -We discussed that her colon cancer is likely cured by complete surgical resection, however she does carry a small risk of cancer recurrence after surgery. -There is no role of adjuvant chemotherapy for stage I colon cancer -Her tumor is MMR normal, she does not have significant family history of colon or endometrial cancer, unlikely Lynch syndrome. -We discussed the surveillance plan. I recommend lab tests including CEA and physical exam every 3-4 months for the first two year, then every 6-12 month for additional 3 years, colonoscopy and CT scan at one year. She voiced good understanding, and agreed to follow.  2. Depression and coping -She appears to be depressed, was very flat facial expression, she states this is her normal status and she denies suicidal ideas.  3. Diabetes, hypertension, history of stroke, COPD -She will continue follow-up with her primary care physician  4. Smoking cessation -We had a long discussion about smoking cessation. She is still smoking 1 pack a day. -She is not interested in smoking  cessation program, but willing to try nicotine patch. I sent a prescription of nicotine patch for 28 days with one refill to her pharmacy [facility] today. I emphasized she needs to b off smoking when she is on the patch.  Follow-up: I'll see her back in 3 months with exam and labs including CBC, CMP, CEA and ferritin.   Orders Placed This Encounter  Procedures  . CBC & Diff and  Retic    Standing Status: Standing     Number of Occurrences: 12     Standing Expiration Date: 06/07/2018  . Comprehensive metabolic panel (Cmet) - [facility]    Standing Status: Standing     Number of Occurrences: 12     Standing Expiration Date: 06/07/2018  . Ferritin    Standing Status: Future     Number of Occurrences:      Standing Expiration Date: 06/07/2016  . CEA    Standing Status: Standing     Number of Occurrences: 12     Standing Expiration Date: 06/07/2018    All questions were answered. The patient knows to call the clinic with any problems, questions or concerns. I spent 55 minutes counseling the patient face to face. The total time spent in the appointment was 60 minutes and more than 50% was on counseling.     Truitt Merle, MD 06/08/2015 12:15 PM

## 2015-06-16 ENCOUNTER — Telehealth: Payer: Self-pay | Admitting: *Deleted

## 2015-06-16 NOTE — Telephone Encounter (Signed)
Oncology Nurse Navigator Documentation  Oncology Nurse Navigator Flowsheets 06/16/2015  Referral date to RadOnc/MedOnc -  Navigator Encounter Type Telephone- 1 week F/U   Patient Visit Type -  Treatment Phase -  Barriers/Navigation Needs No barriers at this time-wound healing, she is eating well, could not afford the patches, but expresses she is not ready to quit.  Education Smoking cessation-reminded her of the classes offered here and how to call and register when she is ready. She understands.  Support Groups/Services -  Time Spent with Patient 10

## 2015-07-10 ENCOUNTER — Other Ambulatory Visit: Payer: Self-pay | Admitting: Endocrinology

## 2015-07-22 ENCOUNTER — Encounter (HOSPITAL_BASED_OUTPATIENT_CLINIC_OR_DEPARTMENT_OTHER): Payer: Medicare Other | Attending: Surgery

## 2015-07-22 DIAGNOSIS — Z85038 Personal history of other malignant neoplasm of large intestine: Secondary | ICD-10-CM | POA: Diagnosis not present

## 2015-07-22 DIAGNOSIS — F319 Bipolar disorder, unspecified: Secondary | ICD-10-CM | POA: Insufficient documentation

## 2015-07-22 DIAGNOSIS — Z79891 Long term (current) use of opiate analgesic: Secondary | ICD-10-CM | POA: Diagnosis not present

## 2015-07-22 DIAGNOSIS — I10 Essential (primary) hypertension: Secondary | ICD-10-CM | POA: Diagnosis not present

## 2015-07-22 DIAGNOSIS — J449 Chronic obstructive pulmonary disease, unspecified: Secondary | ICD-10-CM | POA: Diagnosis not present

## 2015-07-22 DIAGNOSIS — Z79899 Other long term (current) drug therapy: Secondary | ICD-10-CM | POA: Diagnosis not present

## 2015-07-22 DIAGNOSIS — Z794 Long term (current) use of insulin: Secondary | ICD-10-CM | POA: Insufficient documentation

## 2015-07-22 DIAGNOSIS — F17210 Nicotine dependence, cigarettes, uncomplicated: Secondary | ICD-10-CM | POA: Insufficient documentation

## 2015-07-22 DIAGNOSIS — E1140 Type 2 diabetes mellitus with diabetic neuropathy, unspecified: Secondary | ICD-10-CM | POA: Insufficient documentation

## 2015-07-22 DIAGNOSIS — Z7982 Long term (current) use of aspirin: Secondary | ICD-10-CM | POA: Diagnosis not present

## 2015-07-22 DIAGNOSIS — M1990 Unspecified osteoarthritis, unspecified site: Secondary | ICD-10-CM | POA: Insufficient documentation

## 2015-07-22 DIAGNOSIS — M109 Gout, unspecified: Secondary | ICD-10-CM | POA: Insufficient documentation

## 2015-07-22 DIAGNOSIS — L84 Corns and callosities: Secondary | ICD-10-CM | POA: Insufficient documentation

## 2015-07-22 DIAGNOSIS — G4730 Sleep apnea, unspecified: Secondary | ICD-10-CM | POA: Insufficient documentation

## 2015-07-22 LAB — GLUCOSE, CAPILLARY: Glucose-Capillary: 115 mg/dL — ABNORMAL HIGH (ref 65–99)

## 2015-07-29 ENCOUNTER — Other Ambulatory Visit: Payer: Self-pay | Admitting: Internal Medicine

## 2015-08-06 ENCOUNTER — Other Ambulatory Visit: Payer: Medicare Other

## 2015-08-12 ENCOUNTER — Encounter: Payer: Self-pay | Admitting: Endocrinology

## 2015-08-12 ENCOUNTER — Ambulatory Visit (INDEPENDENT_AMBULATORY_CARE_PROVIDER_SITE_OTHER): Payer: Medicare Other | Admitting: Endocrinology

## 2015-08-12 VITALS — BP 124/72 | HR 96 | Temp 98.0°F | Resp 14 | Ht 62.0 in | Wt 205.4 lb

## 2015-08-12 DIAGNOSIS — E782 Mixed hyperlipidemia: Secondary | ICD-10-CM

## 2015-08-12 DIAGNOSIS — E1142 Type 2 diabetes mellitus with diabetic polyneuropathy: Secondary | ICD-10-CM

## 2015-08-12 DIAGNOSIS — E1165 Type 2 diabetes mellitus with hyperglycemia: Secondary | ICD-10-CM

## 2015-08-12 DIAGNOSIS — E876 Hypokalemia: Secondary | ICD-10-CM

## 2015-08-12 DIAGNOSIS — IMO0002 Reserved for concepts with insufficient information to code with codable children: Secondary | ICD-10-CM

## 2015-08-12 LAB — BASIC METABOLIC PANEL
BUN: 14 mg/dL (ref 6–23)
CALCIUM: 9.5 mg/dL (ref 8.4–10.5)
CO2: 27 meq/L (ref 19–32)
CREATININE: 0.85 mg/dL (ref 0.40–1.20)
Chloride: 102 mEq/L (ref 96–112)
GFR: 75.18 mL/min (ref >60.00)
Glucose, Bld: 129 mg/dL — ABNORMAL HIGH (ref 70–99)
Potassium: 3.9 mEq/L (ref 3.5–5.1)
SODIUM: 136 meq/L (ref 135–145)

## 2015-08-12 LAB — LIPID PANEL
CHOLESTEROL: 124 mg/dL (ref 0–200)
HDL: 39.5 mg/dL (ref >39.00)
LDL Cholesterol: 49 mg/dL (ref 0–99)
NonHDL: 84.49
TRIGLYCERIDES: 177 mg/dL — AB (ref 0.0–149.0)
Total CHOL/HDL Ratio: 3
VLDL: 35.4 mg/dL (ref 0.0–40.0)

## 2015-08-12 LAB — POCT GLYCOSYLATED HEMOGLOBIN (HGB A1C): Hemoglobin A1C: 6.8

## 2015-08-12 LAB — POCT GLUCOSE (DEVICE FOR HOME USE): Glucose Fasting, POC: 178 mg/dL — AB (ref 70–99)

## 2015-08-12 LAB — MICROALBUMIN / CREATININE URINE RATIO
CREATININE, U: 26 mg/dL
MICROALB/CREAT RATIO: 2.7 mg/g (ref 0.0–30.0)
Microalb, Ur: <0.7 mg/dL (ref 0.0–1.9)

## 2015-08-12 NOTE — Progress Notes (Signed)
Patient ID: [name], female   DOB: 06/03/1966, 49 y.o.   MRN: [id number]   Reason for Appointment: Diabetes follow-up    History of Present Illness:   Diagnosis: Type 2 diabetes mellitus, date of diagnosis: 2004.  PAST history: She has been treated mostly with insulin since about a year after diagnosis. She has had difficulty with consistent compliance with diet and also compliance with self care including glucose monitoring over the years. She had been mostly treated with basal insulin. Also had been tried on mealtime insulin but she would be noncompliant with this. Was tried on Prandin for mealtime control but difficult to judge efficacy because of lack of postprandial monitoring. Was given Victoza to start in 2011 but did not follow up after this.She was tried on premixed insulin but this did not help her control, mostly because of noncompliance with the doses. She had been using the V- go pump and had better control initially and was better compliant with the daily routine and boluses. She has had frequent education visits also. She stopped using her V.-go pump in 6/15 because of discomfort at the site of application   RECENT history:    INSULIN dose: Lantus 50 units qd    Her A1c has been consistently under 7% and probably is benefiting from [place] She is again not checking blood sugars except rarely at home and difficult to assess her control especially postprandial readings Although she was given Trulicity by her PCP she does not take it now and it was not improving her control or compliance with diet and drinking sweet drinks Her blood sugar is 178 in the office after drinking [facility] Current management and problems identified:  She has check blood sugars very sporadically at home and does not remember the readings  She continues to drink regular soft drinks, mostly [employer], and will drink a 2 L bottle everyday and does not want to switch to Gatorade  because of the cost  She has not changed her insulin doses but she thinks she is taking it consistently  Also she is taking her Invokana although she may not take it consistently in the mornings  However weight has gone up slightly  She is not able to exercise much and does not feel motivated either  No hypoglycemia with current regimen  Glucometer:  FreeStyle.  Checking blood sugar very sporadically and usually not after meals Blood Glucose readings: 97-133 am Hypoglycemia frequency: Never.    Food preferences: eating 1 or 2 meals per day, variable intake, sandwiches at times or otherwise snacks  Physical activity: exercise: Minimal.  She has difficulties with leg pain Certified Diabetes Educator visit: Most recent:, 2/14.  The diet that the patient has been following is no specific diet; still eating at irregular times; still drinking drinks with sugar claiming she is allergic to artificial sweeteners   Wt Readings from Last 3 Encounters:  08/12/15 205 lb 6.4 oz (93.169 kg)  06/08/15 203 lb 9.6 oz (92.352 kg)  05/11/15 199 lb 6.4 oz (90.447 kg)   DM labs:  Lab Results  Component Value Date   HGBA1C 6.8 08/12/2015   HGBA1C 6.6* 04/14/2015   HGBA1C 6.8* 03/09/2015   Lab Results  Component Value Date   MICROALBUR <0.7 08/12/2015   LDLCALC 49 08/12/2015   CREATININE 0.85 [id number]    Complications: are: peripheral neuropathy and she is still fairly symptomatic with this.       Medication List  This list is accurate as of: 08/12/15 11:59 PM.  Always use your most recent med list.               aspirin 325 MG tablet  Take 1 tablet (325 mg total) by mouth daily.     atorvastatin 40 MG tablet  Commonly known as:  LIPITOR  TAKE 1 TABLET (40 MG TOTAL) BY MOUTH DAILY FOR CHOLESTEROL.     Vortioxetine HBr 5 MG Tabs  Take 5 mg by mouth daily.     TRINTELLIX 20 MG Tabs  Generic drug:  Vortioxetine HBr  Take 5 mg by mouth.     BRINTELLIX 20 MG Tabs    Generic drug:  Vortioxetine HBr  Take 20 mg by mouth daily. Takes with the 5mg  tablet for a total dose of 25mg      CHANTIX 0.5 MG tablet  Generic drug:  varenicline     chlorproMAZINE 200 MG tablet  Commonly known as:  THORAZINE  Take 200 mg by mouth at bedtime.     diazepam 10 MG tablet  Commonly known as:  VALIUM  Take 20 mg by mouth at bedtime.     esomeprazole 40 MG capsule  Commonly known as:  NEXIUM  Take 40 mg by mouth 2 (two) times daily before a meal.     fenofibrate 145 MG tablet  Commonly known as:  TRICOR  TAKE 1 TABLET (145 MG TOTAL) BY MOUTH DAILY.     insulin aspart 100 UNIT/ML FlexPen  Commonly known as:  NOVOLOG FLEXPEN  Inject 12 Units into the skin 3 (three) times daily with meals.     INVOKANA 100 MG Tabs tablet  Generic drug:  canagliflozin  TAKE 1 TABLET (100 MG TOTAL) BY MOUTH DAILY.     LANTUS SOLOSTAR 100 UNIT/ML Solostar Pen  Generic drug:  Insulin Glargine  INJECT 46 UNITS DAILY AT THE SAME TIME EVERY DAY. IF SUGAR GOES ABOVE 200 INCREASE DOSE TO 50     methylphenidate 30 MG 24 hr capsule  Commonly known as:  RITALIN LA  30 mg.     nicotine 14 mg/24hr patch  Commonly known as:  NICODERM CQ  Place 1 patch (14 mg total) onto the skin daily.     oxyCODONE 5 MG immediate release tablet  Commonly known as:  Oxy IR/ROXICODONE  Take 5 mg by mouth daily.     oxyCODONE-acetaminophen 10-325 MG per tablet  Commonly known as:  PERCOCET  Take 1 tablet by mouth 3 (three) times daily as needed for pain.     RAPAFLO 8 MG Caps capsule  Generic drug:  silodosin  Take 8 mg by mouth daily.     rizatriptan 10 MG tablet  Commonly known as:  MAXALT  Take 1 tablet (10 mg total) by mouth as needed for migraine. May repeat in 2 hours if needed     spironolactone 25 MG tablet  Commonly known as:  ALDACTONE  TAKE 2 TABLETS (50 MG TOTAL) BY MOUTH 2 (TWO) TIMES DAILY.     tamsulosin 0.4 MG Caps capsule  Commonly known as:  FLOMAX  Take 0.4 mg by mouth  daily.     tiZANidine 4 MG tablet  Commonly known as:  ZANAFLEX  Take 8 mg by mouth daily.        Allergies:  Allergies  Allergen Reactions  . Latex Itching, Rash and Other (See Comments)    Pt states she cannot use condoms - cause an infection.  Use  of latex on skin is okay.  Tape causes rash  . Ibuprofen Other (See Comments)    headaches  . Sweetening Enhancer [Flavoring Agent] Nausea And Vomiting    And headaches  . Tetracyclines & Related Nausea And Vomiting    Yeast infections  . Trazodone And Nefazodone Other (See Comments)    Hallucinations   . Triazolam Other (See Comments)    hallucinations  . Aspartame And Phenylalanine Nausea And Vomiting    headaches    Past Medical History  Diagnosis Date  . Hypertension   . Hyperlipemia   . GERD (gastroesophageal reflux disease)   . Insomnia   . SUI (stress urinary incontinence, female) S/P SLING 12-29-2011  . SOB (shortness of breath) on exertion   . Bipolar disorder   . Neurogenic bladder   . Hypercholesterolemia   . Gastroparesis   . Ulcer   . MRSA (methicillin resistant Staphylococcus aureus)   . Chronic pain   . Edema   . Chest pain     a. 2008 Cath: normal cors;  b. 12/2013 Lexi CL: EF 47%, no ischemia/infarct.  . Anxiety   . Vertigo   . COPD (chronic obstructive pulmonary disease)   . Cholecystitis   . Hypersomnia   . Tobacco abuse   . Claudication     a. 12/2013 ABI's: R 0.97, L 0.94.  [name] Palpitations   . Anginal pain   . Pneumonia     "twice, I think" (02/12/2014)  . Sleep apnea     didn't tolerate cpap (02/12/2014)  . Type II diabetes mellitus   . History of blood transfusion 1986    "related to lost a child" (02/12/2014)  . H/O hiatal hernia   . [id number])     "weekly" (02/12/2014)  . Stroke 02/12/2014    "eyesight is messed up; not steady on my feet" (02/12/2014)  . Arthritis     "qwhere" (02/12/2014)  . DJD (degenerative joint disease)   . Chronic back pain   . Depression   . Personality  disorder   . OSA (obstructive sleep apnea) 07/04/2014    Past Surgical History  Procedure Laterality Date  . Knee arthroscopy w/ allograft impant Left     graft x 2  . Foot surgery Bilateral   . Anterior cervical decomp/discectomy fusion  2000    C6 - 7  . Pubovaginal sling  12/29/2011    Procedure: Gaynelle Arabian;  Surgeon: Reece Packer, MD;  [facility];  Service: Urology;  Laterality: N/A;  cysto and sparc sling   . Lumbar fusion      "cage in my spine"  . Total abdominal hysterectomy w/ bilateral salpingoophorectomy  1997  . Multiple laparoscopies for endometriosis    . Cesarean section  1989; 1992  . Repeat reconstruction acl left knee/ screws removed  03-28-2000    CADAVER GRAFT  . 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 Knee surgery      TOTAL 8 SURG'S  . Cystoscopy with injection  05/04/2012    Procedure: CYSTOSCOPY WITH INJECTION;  Surgeon: Reece Packer, MD;  Location: [facility];  Service: Urology;  Laterality: N/A;  MACROPLASTIQUE INJECTION  . Cystoscopy with injection  08/28/2012    Procedure: CYSTOSCOPY WITH INJECTION;  Surgeon: Reece Packer, MD;  Location: [facility];  Service: Urology;  Laterality: N/A;  cysto and macroplastique   . Carpal tunnel release Right 04-25-2013  . Cysto N/A 04/30/2013  Procedure: CYSTOSCOPY;  Surgeon: Reece Packer, MD;  Location: [facility];  Service: Urology;  Laterality: N/A;  . Pubovaginal sling N/A 04/30/2013    Procedure: REMOVAL OF VAGINAL MESH;  Surgeon: Reece Packer, MD;  Location: [facility];  Service: Urology;  Laterality: N/A;  . Abdominal hysterectomy    . Hernia repair  ?1996    "stomach"  . Cardiac catheterization  05-22-2008   DR SKAINS    NO SIGNIFECANT CAD/ NORMAL LV/  EF 65%/  NO WALL MOTION ABNORMALITIES  . Cardiac catheterization  02/12/2014  . Left heart catheterization with coronary angiogram N/A 02/12/2014    Procedure: LEFT HEART  CATHETERIZATION WITH CORONARY ANGIOGRAM;  Surgeon: Candee Furbish, MD;  Location: [facility] CATH [facility];  Service: Cardiovascular;  Laterality: N/A;  . Laparoscopic sigmoid colectomy  04/22/2015  . Laparoscopic sigmoid colectomy N/A 04/22/2015    Procedure: LAPAROSCOPIC HAND ASSISTED SIGMOID COLECTOMY;  Surgeon: Erroll Luna, MD;  Location: [facility];  Service: General;  Laterality: N/A;    Family History  Problem Relation Age of Onset  . Hypertension Mother   . Diabetes Mother   . Cancer - Other Mother     lymphoma   . Cancer - Other Father     lung, bladder cancer   . Heart attack Father   . Cancer - Other Brother     bladder cancer     Social History:  reports that she has been smoking Cigarettes and E-cigarettes.  She has a 41 pack-year smoking history. She has never used smokeless tobacco. She reports that she drinks alcohol. She reports that she does not use illicit drugs.  Review of Systems -   She has history of high triglycerides treated with fenofibrate.   Also she is taking Lipitor    Lab Results  Component Value Date   CHOL 124 08/12/2015   HDL 39.50 08/12/2015   LDLCALC 49 08/12/2015   LDLDIRECT 57.0 03/09/2015   TRIG 177.0* 08/12/2015   CHOLHDL 3 08/12/2015    Hypertension: This has been relatively mild and controlled . She is taking spironolactone 2 tablets daily Not taking ramipril now  HYPOKALEMIA: This has been controlled with Aldactone.     Painful neuropathy: She has had continued symptoms including leg pains and numbness as well as difficulty with balance and has tried various drugs including gabapentin without much relief  Foot exam shows absent pedal pulses and distal sensation   Examination:   BP 124/72 mmHg  Pulse 96  Temp(Src) 98 F (36.7 C)  Resp 14  Ht 5\' 2"  (1.575 m)  Wt 205 lb 6.4 oz (93.169 kg)  BMI 37.56 kg/m2  SpO2 97%  Body mass index is 37.56 kg/(m^2).   She has mild facial puffiness without swelling of the eyes No ankle  edema  Assesment/Plan:   1. Diabetes type 2, with fair control   The patient's diabetes is relatively well controlled with A1c under 7% consistently She probably has benefited significantly from using Invokana She continues to be noncompliant with diet and drinks regular soft drinks leg [facility]; however her blood sugars are not significantly high overall and go up only to about 170 after these drinks as seen today in the office She is checking blood sugars rarely despite reminders and was encouraged to start doing more readings especially after meals Currently not exercising, has severe neuropathy She is complaining about her weight gain and may benefit from increasing her Invokana to 300 mg  2. Hypertension: Blood pressure  is  normal with Aldactone only Since her Anastasio Auerbach is being increased she can reduce her Aldactone to 25 mg, will need to continue monitoring her electrolytes  3.  Hypokalemia: This is usually controlled with taking Aldactone   4.  She does not take aspirin and recommended taking this, reportedly has had a CVA  Patient Instructions  Switch Mtn Dew to Gatorade or lite juices  Invokana 2 at a time   Reduce Arlyce Harman, to 1 daily  Check sugar daily and if < 100 reduce to 46 Lantus  Resart asa      Counseling time on subjects discussed above is over 50% of today's 25 minute visit   Jacarie Pate 08/13/2015, 10:35 AM    Addendum: Labs as follows  Office Visit on 08/12/2015  Component Date Value Ref Range Status  . Sodium 08/12/2015 136  135 - 145 mEq/L Final  . Potassium 08/12/2015 3.9  3.5 - 5.1 mEq/L Final  . Chloride 08/12/2015 102  96 - 112 mEq/L Final  . CO2 08/12/2015 27  19 - 32 mEq/L Final  . Glucose, Bld 08/12/2015 129* 70 - 99 mg/dL Final  . BUN 08/12/2015 14  6 - 23 mg/dL Final  . Creatinine, Ser 08/12/2015 0.85  0.40 - 1.20 mg/dL Final  . Calcium 08/12/2015 9.5  8.4 - 10.5 mg/dL Final  . GFR 08/12/2015 75.18  >60.00 mL/min Final  .  Cholesterol 08/12/2015 124  0 - 200 mg/dL Final   ATP III Classification       Desirable:  < 200 mg/dL               Borderline High:  200 - 239 mg/dL          High:  > = 240 mg/dL  . Triglycerides 08/12/2015 177.0* 0.0 - 149.0 mg/dL Final   Normal:  <150 mg/dLBorderline High:  150 - 199 mg/dL  . HDL 08/12/2015 39.50  >39.00 mg/dL Final  . VLDL 08/12/2015 35.4  0.0 - 40.0 mg/dL Final  . LDL Cholesterol 08/12/2015 49  0 - 99 mg/dL Final  . Total CHOL/HDL Ratio 08/12/2015 3   Final                  Men          Women1/2 Average Risk     3.4          3.3Average Risk          5.0          4.42X Average Risk          9.6          7.13X Average Risk          15.0          11.0                      . NonHDL 08/12/2015 84.49   Final   NOTE:  Non-HDL goal should be 30 mg/dL higher than patient's LDL goal (i.e. LDL goal of < 70 mg/dL, would have non-HDL goal of < 100 mg/dL)  . Hemoglobin A1C 08/12/2015 6.8   Final  . Glucose Fasting, POC 08/12/2015 178* 70 - 99 mg/dL Final  . Microalb, Ur 08/12/2015 <0.7  0.0 - 1.9 mg/dL Final  . Creatinine,U 08/12/2015 26.0   Final  . Microalb Creat Ratio 08/12/2015 2.7  0.0 - 30.0 mg/g Final

## 2015-08-12 NOTE — Patient Instructions (Addendum)
Switch Mtn Dew to Gatorade or lite juices  Invokana 2 at a time   Reduce Arlyce Harman, to 1 daily  Check sugar daily and if < 100 reduce to 46 Lantus  Resart asa

## 2015-08-13 ENCOUNTER — Other Ambulatory Visit: Payer: Self-pay | Admitting: *Deleted

## 2015-08-13 DIAGNOSIS — C189 Malignant neoplasm of colon, unspecified: Secondary | ICD-10-CM

## 2015-08-13 MED ORDER — CANAGLIFLOZIN 300 MG PO TABS: 300.0000 mg | ORAL_TABLET | Freq: Every day | ORAL | Status: DC

## 2015-08-29 ENCOUNTER — Other Ambulatory Visit: Payer: Self-pay | Admitting: Internal Medicine

## 2015-09-08 ENCOUNTER — Telehealth: Payer: Self-pay | Admitting: Hematology

## 2015-09-08 ENCOUNTER — Ambulatory Visit (HOSPITAL_BASED_OUTPATIENT_CLINIC_OR_DEPARTMENT_OTHER): Payer: Medicare Other | Admitting: Hematology

## 2015-09-08 ENCOUNTER — Encounter: Payer: Self-pay | Admitting: Hematology

## 2015-09-08 ENCOUNTER — Other Ambulatory Visit (HOSPITAL_BASED_OUTPATIENT_CLINIC_OR_DEPARTMENT_OTHER): Payer: Medicare Other

## 2015-09-08 VITALS — BP 121/67 | HR 99 | Temp 97.6°F | Resp 18 | Ht 62.0 in | Wt 209.0 lb

## 2015-09-08 DIAGNOSIS — C189 Malignant neoplasm of colon, unspecified: Secondary | ICD-10-CM | POA: Diagnosis present

## 2015-09-08 DIAGNOSIS — Z720 Tobacco use: Secondary | ICD-10-CM

## 2015-09-08 DIAGNOSIS — Z823 Family history of stroke: Secondary | ICD-10-CM

## 2015-09-08 DIAGNOSIS — Z862 Personal history of diseases of the blood and blood-forming organs and certain disorders involving the immune mechanism: Secondary | ICD-10-CM

## 2015-09-08 DIAGNOSIS — F329 Major depressive disorder, single episode, unspecified: Secondary | ICD-10-CM | POA: Diagnosis not present

## 2015-09-08 DIAGNOSIS — E119 Type 2 diabetes mellitus without complications: Secondary | ICD-10-CM | POA: Diagnosis not present

## 2015-09-08 LAB — CBC & DIFF AND RETIC
BASO%: 0.5 % (ref 0.0–2.0)
Basophils Absolute: 0.1 10*3/uL (ref 0.0–0.1)
EOS%: 0 % (ref 0.0–7.0)
Eosinophils Absolute: 0 10*3/uL (ref 0.0–0.5)
HCT: 38.3 % (ref 34.8–46.6)
HEMOGLOBIN: 12.9 g/dL (ref 11.6–15.9)
IMMATURE RETIC FRACT: 4.2 % (ref 1.60–10.00)
LYMPH%: 24.8 % (ref 14.0–49.7)
MCH: 29.5 pg (ref 25.1–34.0)
MCHC: 33.7 g/dL (ref 31.5–36.0)
MCV: 87.6 fL (ref 79.5–101.0)
MONO#: 0.5 10*3/uL (ref 0.1–0.9)
MONO%: 4.3 % (ref 0.0–14.0)
NEUT%: 70.4 % (ref 38.4–76.8)
NEUTROS ABS: 7.4 10*3/uL — AB (ref 1.5–6.5)
Platelets: 223 10*3/uL (ref 145–400)
RBC: 4.37 10*6/uL (ref 3.70–5.45)
RDW: 13.3 % (ref 11.2–14.5)
RETIC %: 1.99 % (ref 0.70–2.10)
Retic Ct Abs: 86.96 10*3/uL (ref 33.70–90.70)
WBC: 10.4 10*3/uL — AB (ref 3.9–10.3)
lymph#: 2.6 10*3/uL (ref 0.9–3.3)

## 2015-09-08 LAB — COMPREHENSIVE METABOLIC PANEL (CC13)
ALBUMIN: 3.6 g/dL (ref 3.5–5.0)
ALK PHOS: 58 U/L (ref 40–150)
ALT: 22 U/L (ref 0–55)
AST: 17 U/L (ref 5–34)
Anion Gap: 9 mEq/L (ref 3–11)
BILIRUBIN TOTAL: 0.2 mg/dL (ref 0.20–1.20)
BUN: 16 mg/dL (ref 7.0–26.0)
CO2: 27 meq/L (ref 22–29)
Calcium: 9.3 mg/dL (ref 8.4–10.4)
Chloride: 103 mEq/L (ref 98–109)
Creatinine: 1.1 mg/dL (ref 0.6–1.1)
EGFR: 60 mL/min/{1.73_m2} — AB (ref >90)
GLUCOSE: 251 mg/dL — AB (ref 70–140)
Potassium: 3.9 mEq/L (ref 3.5–5.1)
SODIUM: 139 meq/L (ref 136–145)
TOTAL PROTEIN: 6.5 g/dL (ref 6.4–8.3)

## 2015-09-08 LAB — FERRITIN CHCC: Ferritin: 158 ng/ml (ref 9–269)

## 2015-09-08 NOTE — Telephone Encounter (Signed)
gaev adn printed appt sched and avs fo rpt for DEC °

## 2015-09-08 NOTE — Progress Notes (Signed)
[place]  Telephone:([phone]) [phone] Fax:(336) [phone]  Clinic follow up Note   Patient Care Team: Tamsen Roers, MD as PCP - General (Family Medicine) Elayne Snare, MD as Consulting Physician (Endocrinology) Erroll Luna, MD as Consulting Physician (General Surgery) Truitt Merle, MD as Consulting Physician (Hematology) 09/08/2015  CHIEF COMPLAINTS/PURPOSE OF CONSULTATION:  Stage I colon cancer   Oncology History   Colon cancer   Staging form: Colon and Rectum, AJCC 7th Edition     Pathologic stage from 05/16/2015: Stage I (T1, N0, cM0) - Signed by Truitt Merle, MD on 06/08/2015       Colon cancer   02/26/2015 Initial Diagnosis Sigmoid Colon cancer   02/26/2015 Procedure Colonoscopy showed internal hemorrhoids, 2 polyps in the transverse and ileocecal valve. There is a ulcerated polypoid lesion in the sigmoid colon which was biopsied.   03/02/2015 Tumor Marker CEA= 2.5   03/06/2015 Imaging CT chest, abdomen and pelvis with contrast showed no evidence of metastatic disease.   04/22/2015 Surgery Sigmoid colon segmental resection, showed grade 2 adenocarcinoma, T1, 1.3 cm, 10 lymph nodes were negative, no lymphovascular invasion, no perineural invasion.     HISTORY OF PRESENTING ILLNESS:  [name] 49 y.o. female with the multiple medical comorbidities, including diabetes, hypertension, depression and stroke in 2015, is here because of recently diagnosed colon cancer.  She has chronic constipation, no nausea, abdominal pain or diarrhea. She denies melena or hematochezia. She was referred to see GI Dr. Paulita Fujita, and underwent a colonoscopy on 02/26/2015. The colonoscopy showed internal hemorrhoids, 2 polyps in the transverse and ileocecal valve. A ulcerated polypoid lesion in the sigmoid colon was seen and biopsied, which showed adenocarcinoma. She was referred to  surgeon Dr. Brantley Stage, and underwent laparoscopic sigmoid colon segmental resection on 04/22/2015.  She has recovered  well from surgery. She was discharged home after surgery. She had ED visit on 05/04/2015 for wound dehiscence after staple removal. She has been changing the dressing every other day at home. She has appointment to see Dr. Brantley Stage this abdomen.  She denies any significant pain, BM is normal. No other new complains. She has diabetic neuropathy in hands and feet, which is stable. She lives alone, does not have much social support. She appears to be depressed with a very flat facial expression, but she denies suicidal ideas.  INTERIM HISTORY  [name] returns for follow-up. She is doing well overall. She denies significant pain, abdominal discomfort, change of her bowel habits, melanoma or hematochezia. She does have mild intermittent nausea, resolves on its own. No other new complaints. She has very good appetite, no weight loss.  MEDICAL HISTORY:  Past Medical History  Diagnosis Date  . Hypertension   . Hyperlipemia   . GERD (gastroesophageal reflux disease)   . Insomnia   . SUI (stress urinary incontinence, female) S/P SLING 12-29-2011  . SOB (shortness of breath) on exertion   . Bipolar disorder   . Neurogenic bladder   . Hypercholesterolemia   . Gastroparesis   . Ulcer   . MRSA (methicillin resistant Staphylococcus aureus)   . Chronic pain   . Edema   . Chest pain     a. 2008 Cath: normal cors;  b. 12/2013 Lexi CL: EF 47%, no ischemia/infarct.  . Anxiety   . Vertigo   . COPD (chronic obstructive pulmonary disease)   . Cholecystitis   . Hypersomnia   . Tobacco abuse   . Claudication     a. 12/2013 ABI's: R 0.97,  L 0.94.  [name] Palpitations   . Anginal pain   . Pneumonia     "twice, I think" (02/12/2014)  . Sleep apnea     didn't tolerate cpap (02/12/2014)  . Type II diabetes mellitus   . History of blood transfusion 1986    "related to lost a child" (02/12/2014)  . H/O hiatal hernia   . QVZDGLOV(564.3)     "weekly" (02/12/2014)  . Stroke 02/12/2014    "eyesight is messed up; not  steady on my feet" (02/12/2014)  . Arthritis     "qwhere" (02/12/2014)  . DJD (degenerative joint disease)   . Chronic back pain   . Depression   . Personality disorder   . OSA (obstructive sleep apnea) 07/04/2014    SURGICAL HISTORY: Past Surgical History  Procedure Laterality Date  . Knee arthroscopy w/ allograft impant Left     graft x 2  . Foot surgery Bilateral   . Anterior cervical decomp/discectomy fusion  2000    C6 - 7  . Pubovaginal sling  12/29/2011    Procedure: Gaynelle Arabian;  Surgeon: Reece Packer, MD;  Location: [facility];  Service: Urology;  Laterality: N/A;  cysto and sparc sling   . Lumbar fusion      "cage in my spine"  . Total abdominal hysterectomy w/ bilateral salpingoophorectomy  1997  . Multiple laparoscopies for endometriosis    . Cesarean section  1989; 1992  . Repeat reconstruction acl left knee/ screws removed  03-28-2000    CADAVER GRAFT  . 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 Knee surgery      TOTAL 8 SURG'S  . Cystoscopy with injection  05/04/2012    Procedure: CYSTOSCOPY WITH INJECTION;  Surgeon: Reece Packer, MD;  Location: [facility];  Service: Urology;  Laterality: N/A;  MACROPLASTIQUE INJECTION  . Cystoscopy with injection  08/28/2012    Procedure: CYSTOSCOPY WITH INJECTION;  Surgeon: Reece Packer, MD;  Location: [facility];  Service: Urology;  Laterality: N/A;  cysto and macroplastique   . Carpal tunnel release Right 04-25-2013  . Cysto N/A 04/30/2013    Procedure: CYSTOSCOPY;  Surgeon: Reece Packer, MD;  Location: [facility];  Service: Urology;  Laterality: N/A;  . Pubovaginal sling N/A 04/30/2013    Procedure: REMOVAL OF VAGINAL MESH;  Surgeon: Reece Packer, MD;  Location: [facility];  Service: Urology;  Laterality: N/A;  . Abdominal hysterectomy    . Hernia repair  ?1996    "stomach"  . Cardiac catheterization  05-22-2008   DR SKAINS    NO SIGNIFECANT CAD/  NORMAL LV/  EF 65%/  NO WALL MOTION ABNORMALITIES  . Cardiac catheterization  02/12/2014  . Left heart catheterization with coronary angiogram N/A 02/12/2014    Procedure: LEFT HEART CATHETERIZATION WITH CORONARY ANGIOGRAM;  Surgeon: Candee Furbish, MD;  Location: [facility];  Service: Cardiovascular;  Laterality: N/A;  . Laparoscopic sigmoid colectomy  04/22/2015  . Laparoscopic sigmoid colectomy N/A 04/22/2015    Procedure: LAPAROSCOPIC HAND ASSISTED SIGMOID COLECTOMY;  Surgeon: Erroll Luna, MD;  Location: [place] [facility];  Service: General;  Laterality: N/A;    SOCIAL HISTORY: Social History   Social History  . Marital Status: Divorced    Spouse Name: N/A  . Number of Children: 2  . Years of Education: 6th   Occupational History  . Not on file.   Social History Main Topics  . Smoking status: Current Every Day  Smoker -- 1.00 packs/day for 41 years    Types: Cigarettes, E-cigarettes  . Smokeless tobacco: Never Used  . Alcohol Use: 0.0 oz/week     Comment: 02/12/2014 "might have a mixed drink on special occasions"  . Drug Use: No  . Sexual Activity: Yes   Other Topics Concern  . Not on file   Social History Narrative  Patient is divorced and lives alone- independent in ADLs, Drives   Patient has two adult children-  grown son and daughter who is special needs in a home   Patient is disabled since 71   Patient has a 6th grade education.   Patient is right-handed.   Depression-medication and therapist   Patient drinks 2- 3 liters of soda and drinks tea but not everyday.    Does not routinely exercise.    FAMILY HISTORY: Family History  Problem Relation Age of Onset  . Hypertension Mother   . Diabetes Mother   . Cancer - Other Mother     lymphoma   . Cancer - Other Father     lung, bladder cancer   . Heart attack Father   . Cancer - Other Brother     bladder cancer     ALLERGIES:  is allergic to latex; ibuprofen; sweetening enhancer; tetracyclines & related; trazodone and nefazodone; triazolam; and aspartame and phenylalanine.  MEDICATIONS:  Current Outpatient Prescriptions  Medication Sig Dispense Refill  . amphetamine-dextroamphetamine (ADDERALL) 30 MG tablet Take 1 tablet by mouth 2 (two) times daily.  0  . aspirin 325 MG tablet Take 1 tablet (325 mg total) by mouth daily.    [name] atorvastatin (LIPITOR) 40 MG tablet TAKE 1 TABLET (40 MG TOTAL) BY MOUTH DAILY FOR CHOLESTEROL. (NEEDS OFFICE VISIT!) 30 tablet 0  . BRINTELLIX 20 MG TABS Take 20 mg by mouth daily. Takes with the 89m tablet for a total dose of 258m   . canagliflozin (INVOKANA) 300 MG TABS tablet Take 300 mg by mouth daily before breakfast. 30 tablet 3  . CHANTIX 0.5 MG tablet     . chlorproMAZINE (THORAZINE) 200 MG tablet Take 200 mg by mouth at bedtime.    . diazepam (VALIUM) 10 MG tablet Take 20 mg by mouth at bedtime.     . Marland Kitchensomeprazole (NEXIUM) 40 MG capsule Take 40 mg by mouth 2 (two) times daily before a meal.    . fenofibrate (TRICOR) 145 MG tablet TAKE 1 TABLET (145 MG TOTAL) BY MOUTH DAILY. 30 tablet 5  . insulin aspart (NOVOLOG FLEXPEN) 100 UNIT/ML FlexPen Inject 12 Units into the skin 3 (three) times daily with meals. 15 mL 3  . LANTUS SOLOSTAR 100 UNIT/ML Solostar Pen INJECT 46 UNITS DAILY AT THE SAME TIME EVERY DAY.  IF SUGAR GOES ABOVE 200 INCREASE DOSE TO 50 (Patient taking differently: INJECT 50 UNITS DAILY AT THE SAME TIME EVERY DAY. IF SUGAR GOES ABOVE 200 INCREASE DOSE TO 50) 45 mL 2  . oxyCODONE-acetaminophen (PERCOCET) 10-325 MG per tablet Take 1 tablet by mouth 3 (three) times daily as needed for pain.    . Marland KitchenAPAFLO 8 MG CAPS capsule Take 8 mg by mouth daily.     . rizatriptan (MAXALT) 10 MG tablet Take 1 tablet (10 mg total) by mouth as needed for migraine. May repeat in 2 hours if needed 10 tablet 3  . spironolactone (ALDACTONE) 25 MG tablet TAKE 2 TABLETS (50 MG TOTAL) BY MOUTH 2 (TWO) TIMES DAILY. 60 tablet 2  . tamsulosin (FLOMAX) 0.4 MG CAPS capsule Take 0.4 mg by mouth daily.  11  . tiZANidine (ZANAFLEX) 4 MG tablet Take 8 mg by mouth daily.  1  . Vortioxetine HBr (TRINTELLIX) 20 MG TABS Take 5 mg by mouth.    . Vortioxetine HBr 5 MG TABS Take 5 mg by mouth daily.     No current facility-administered medications for this visit.  REVIEW OF SYSTEMS:   Constitutional: Denies fevers, chills or abnormal night sweats Eyes: Denies blurriness of vision, double vision or watery eyes Ears, nose, mouth, throat, and face: Denies mucositis or sore throat Respiratory: Denies cough, dyspnea or wheezes Cardiovascular: Denies palpitation, chest discomfort or lower extremity swelling Gastrointestinal:  Denies nausea, heartburn or change in bowel habits Skin: Denies abnormal skin rashes Lymphatics: Denies new lymphadenopathy or easy bruising Neurological:Denies numbness, tingling or new weaknesses Behavioral/Psych: Mood is stable, no new changes  All other systems were reviewed with the patient and are negative.  PHYSICAL EXAMINATION: ECOG PERFORMANCE STATUS: 1 - Symptomatic but completely ambulatory  Filed Vitals:   09/08/15 1442  BP: 121/67  Pulse: 99  Temp: 97.6 F (36.4 C)  Resp: 18   Filed Weights   09/08/15 1442  Weight: 209 lb (94.802 kg)    GENERAL:alert, no distress and  comfortable SKIN: skin color, texture, turgor are normal, no rashes or significant lesions EYES: normal, conjunctiva are pink and non-injected, sclera clear OROPHARYNX:no exudate, no erythema and lips, buccal mucosa, and tongue normal  NECK: supple, thyroid normal size, non-tender, without nodularity LYMPH:  no palpable lymphadenopathy in the cervical, axillary or inguinal LUNGS: clear to auscultation and percussion with normal breathing effort HEART: regular rate & rhythm and no murmurs and no lower extremity edema ABDOMEN:abdomen soft, no abdomen midline surgical wound is covered by gauze. Non-tender and normal bowel sounds Musculoskeletal:no cyanosis of digits and no clubbing  PSYCH: alert & oriented x 3 with fluent speech NEURO: no focal motor/sensory deficits  LABORATORY DATA:  I have reviewed the data as listed CBC Latest Ref Rng 09/08/2015 05/04/2015 04/23/2015  WBC 3.9 - 10.3 10e3/uL 10.4(H) 13.6(H) 15.3(H)  Hemoglobin 11.6 - 15.9 g/dL 12.9 11.6(L) 12.1  Hematocrit 34.8 - 46.6 % 38.3 34.5(L) 37.1  Platelets 145 - 400 10e3/uL 223 337 231    CMP Latest Ref Rng 09/08/2015 08/12/2015 05/11/2015  Glucose 70 - 140 mg/dl 251(H) 129(H) 124(H)  BUN 7.0 - 26.0 mg/dL 16.0 14 10  Creatinine 0.6 - 1.1 mg/dL 1.1 0.85 0.97  Sodium 136 - 145 mEq/L 139 136 136  Potassium 3.5 - 5.1 mEq/L 3.9 3.9 3.7  Chloride 96 - 112 mEq/L - 102 102  CO2 22 - 29 mEq/L _0 Calcium 8.4 - 10.4 mg/dL 9.3 9.5 9.4  Total Protein 6.4 - 8.3 g/dL 6.5 - 6.6  Total Bilirubin 0.20 - 1.20 mg/dL 0.20 - 0.2  Alkaline Phos 40 - 150 U/L 58 - 49  AST 5 - 34 U/L 17 - 19  ALT 0 - 55 U/L 22 - 14   CEA is pending   PATHOLOGY REPORT Diagnosis 04/22/2015 1. Colon, segmental resection for tumor, Sigmoid - COLONIC ADENOCARCINOMA EXTENDING INTO SUBMUCOSA, 1.3 CM - MARGINS NOT INVOLVED. - NINE BENIGN LYMPH NODES (0/9). 2. Colon, segmental resection, Sigmoid - FOCAL MESENTERIC FAT NECROSIS WITH HEMORRHAGE AND FIBROSIS. - NO  EVIDENCE OF MALIGNANCY. - ONE BENIGN LYMPH NODE (0/1). 3. Colon, resection margin (donut), Proximal donut - BENIGN COLON TISSUE. - NO EVIDENCE OF MALIGNANCY. 4. Colon, resection margin (donut), Distal donut - BENIGN COLON TISSUE. - NO EVIDENCE OF MALIGNANCY.  Microscopic Comment 1. COLON AND RECTUM (INCLUDING TRANS-ANAL RESECTION): Specimen: Sigmoid colon with proximal and distal donut margins. Procedure: Segmental resection with proximal and distal donut margins. Tumor site: Sigmoid Specimen integrity: Previously opened Macroscopic intactness of mesorectum: Incomplete Macroscopic tumor perforation: No Invasive tumor: Maximum size: 1.3 cm Histologic type(s): Colonic adenocarcinoma Histologic grade  and differentiation: G2: moderately differentiated/low grade Type of polyp in which invasive carcinoma arose: No residual polyp Microscopic extension of invasive tumor: Into submucosa Lymph-Vascular invasion: N Peri-neural invasion: No Tumor deposit(s) (discontinuous extramural extension): No Resection margins: Proximal margin: Free of tumor Distal margin: Free of tumor Circumferential (radial) (posterior ascending, posterior descending; lateral and posterior mid-rectum; and entire lower 1/3 rectum): N/A Mesenteric margin (sigmoid and transverse): Free of tumor Distance closest margin (if all above margins negative): N/A Treatment effect (neo-adjuvant therapy): No Additional polyp(s): No Non-neoplastic findings: Focal mesenteric adipose tissue fat necrosis with hemorrhage and fibrosis. Lymph nodes: number examined 10; number positive: 0 Pathologic Staging: pT1, pN0, pMX Ancillary studies: Pending. (JDP:kh 4 -28-16)  RADIOGRAPHIC STUDIES: I have personally reviewed the radiological images as listed and agreed with the findings in the report.  CT chest, abdomen and pelvis w contrast on 03/06/2015  IMPRESSION: 1. No findings to suggest metastatic disease in the chest, abdomen or  pelvis. 2. Diffuse thickening of the colonic wall in the region of the sigmoid colon. Typically this is seen in the setting of acute colitis, however, there are no surrounding inflammatory changes and no definite hypervascularity in the sigmoid mesocolon on today's examination. This may represent sequela of prior episodes of recurrent sigmoid colitis. No discrete mass is identified in this region, but underlying neoplasm in this area is not excluded. 3. Normal appendix. 4. Atherosclerosis, including left anterior descending coronary artery disease. Please note that although the presence of coronary artery calcium documents the presence of coronary artery disease, the severity of this disease and any potential stenosis cannot be assessed on this non-gated CT examination. Assessment for potential risk factor modification, dietary therapy or pharmacologic therapy may be warranted, if clinically indicated. 5. Additional incidental findings, as above.  ASSESSMENT & PLAN: 49 year old Caucasian female, with multiple medical comorbidities, who was found to have a stage I sigmoid colon cancer.  1. Stage I sigmoid colon adenocarcinoma, pT1N0M0, moderately differentiated, MMR normall  -I reviewed her surgical pathology findings with patient in great details. She appears to have very early stage colon cancer, no lymphovascular invasion, no perineural invasion or other high risk features. Her surgical margins were negative.  -We discussed that her colon cancer is likely cured by complete surgical resection, however she does carry a small risk of cancer recurrence after surgery. -There is no role of adjuvant chemotherapy for stage I colon cancer -Her tumor is MMR normal, she does not have significant family history of colon or endometrial cancer, unlikely Lynch syndrome. -We discussed the surveillance plan. I recommend lab tests including CEA and physical exam every 3-4 months for the first two year, then  every 6-12 month for additional 3 years, colonoscopy and CT scan at one year.  -Lab reviewed, CBC and CMP normal, except hyperglycemia. -Her CEA from today is still pending. -CT and colonoscopy in March 2017  2. Depression and coping -She appears to be depressed, has very flat facial expression, she states this is her normal status and she denies suicidal ideas.  3. Diabetes, hypertension, history of stroke, COPD -She will continue follow-up with her primary care physician -Her blood glucose is not well controlled, 251 today.   4. Smoking cessation -We had a long discussion about smoking cessation. She is still smoking 1.5 pack a day. -She is willing to cut back and quit, I strongly encouraged her to do so. She is not interested in smoking cessation program.  Follow-up: I'll see her back in 3 months  with exam and labs including CBC, CMP, CEA.  All questions were answered. The patient knows to call the clinic with any problems, questions or concerns.  I spent 20 minutes counseling the patient face to face. The total time spent in the appointment was 25 minutes and more than 50% was on counseling.     Truitt Merle, MD 09/08/2015 10:27 PM

## 2015-09-09 LAB — CEA: CEA: 2.1 ng/mL (ref 0.0–5.0)

## 2015-09-27 ENCOUNTER — Other Ambulatory Visit: Payer: Self-pay | Admitting: Internal Medicine

## 2015-10-25 ENCOUNTER — Other Ambulatory Visit: Payer: Self-pay | Admitting: Internal Medicine

## 2015-10-29 ENCOUNTER — Ambulatory Visit: Payer: Medicare Other | Admitting: Endocrinology

## 2015-11-09 ENCOUNTER — Telehealth: Payer: Self-pay | Admitting: *Deleted

## 2015-11-09 ENCOUNTER — Other Ambulatory Visit (INDEPENDENT_AMBULATORY_CARE_PROVIDER_SITE_OTHER): Payer: Medicare Other

## 2015-11-09 DIAGNOSIS — IMO0002 Reserved for concepts with insufficient information to code with codable children: Secondary | ICD-10-CM

## 2015-11-09 DIAGNOSIS — E1165 Type 2 diabetes mellitus with hyperglycemia: Secondary | ICD-10-CM | POA: Diagnosis not present

## 2015-11-09 LAB — COMPREHENSIVE METABOLIC PANEL
ALBUMIN: 3.7 g/dL (ref 3.5–5.2)
ALK PHOS: 84 U/L (ref 39–117)
ALT: 20 U/L (ref 0–35)
AST: 17 U/L (ref 0–37)
BILIRUBIN TOTAL: 0.3 mg/dL (ref 0.2–1.2)
BUN: 6 mg/dL (ref 6–23)
CO2: 25 mEq/L (ref 19–32)
Calcium: 9.3 mg/dL (ref 8.4–10.5)
Chloride: 98 mEq/L (ref 96–112)
Creatinine, Ser: 0.99 mg/dL (ref 0.40–1.20)
GFR: 62.99 mL/min (ref >60.00)
GLUCOSE: 377 mg/dL — AB (ref 70–99)
Potassium: 3.1 mEq/L — ABNORMAL LOW (ref 3.5–5.1)
Sodium: 135 mEq/L (ref 135–145)
Total Protein: 6.4 g/dL (ref 6.0–8.3)

## 2015-11-09 LAB — HEMOGLOBIN A1C: Hgb A1c MFr Bld: 8.3 % — ABNORMAL HIGH (ref 4.6–6.5)

## 2015-11-09 NOTE — Telephone Encounter (Signed)
That is not covered and won't work without checking sugar 2x daily

## 2015-11-09 NOTE — Telephone Encounter (Signed)
Patient came in for labs today, she is interested in getting the Dexcom.  She has an appointment on the 17th.

## 2015-11-10 ENCOUNTER — Other Ambulatory Visit: Payer: Self-pay | Admitting: Internal Medicine

## 2015-11-11 NOTE — Telephone Encounter (Signed)
Send to PCP.

## 2015-11-11 NOTE — Telephone Encounter (Signed)
Ok to deny and ask pharmacy to send to pcp? She is way overdue and her pcp recently checked her lipids.

## 2015-11-12 ENCOUNTER — Encounter: Payer: Self-pay | Admitting: Endocrinology

## 2015-11-12 ENCOUNTER — Ambulatory Visit (INDEPENDENT_AMBULATORY_CARE_PROVIDER_SITE_OTHER): Payer: Medicare Other | Admitting: Endocrinology

## 2015-11-12 VITALS — BP 122/72 | HR 85 | Temp 98.7°F | Resp 14 | Ht 62.0 in | Wt 216.4 lb

## 2015-11-12 DIAGNOSIS — E1165 Type 2 diabetes mellitus with hyperglycemia: Secondary | ICD-10-CM

## 2015-11-12 DIAGNOSIS — R5381 Other malaise: Secondary | ICD-10-CM | POA: Diagnosis not present

## 2015-11-12 DIAGNOSIS — Z794 Long term (current) use of insulin: Secondary | ICD-10-CM | POA: Diagnosis not present

## 2015-11-12 DIAGNOSIS — E876 Hypokalemia: Secondary | ICD-10-CM

## 2015-11-12 DIAGNOSIS — R5383 Other fatigue: Secondary | ICD-10-CM

## 2015-11-12 DIAGNOSIS — E782 Mixed hyperlipidemia: Secondary | ICD-10-CM

## 2015-11-12 MED ORDER — POTASSIUM CHLORIDE CRYS ER 20 MEQ PO TBCR: 20.0000 meq | EXTENDED_RELEASE_TABLET | Freq: Every day | ORAL | Status: DC

## 2015-11-12 MED ORDER — VICTOZA 18 MG/3ML ~~LOC~~ SOPN: 1.2000 mg | PEN_INJECTOR | Freq: Every day | SUBCUTANEOUS | Status: DC

## 2015-11-12 NOTE — Patient Instructions (Addendum)
Check blood sugars on waking up 4-5  times a week Also check blood sugars about 2 hours after a meal and do this after different meals by rotation  Recommended blood sugar levels on waking up is 90-130 and about 2 hours after meal is 130-160  Please bring your blood sugar monitor to each visit, thank you  Start VICTOZA injection as shown once daily at the same time of the day.  Dial the dose to 0.6 mg on the pen for the first week.   You may inject in the stomach, thigh or arm. You may experience nausea in the first few days which usually goes away.   You will feel fullness of the stomach with starting the medication and should try to keep the portions at meals small. After 1 week increase the dose to 1.2mg  daily if no nausea present.   If any questions or concerns are present call the office or the [place] helpline at [phone]. Visit [email or web address].info/ for more useful information  Cut Lantus to 45 when am sugar < 90 in the ams  Spirono take 2 daily  Restart fenofibrate

## 2015-11-12 NOTE — Progress Notes (Signed)
Patient ID: [name], female   DOB: March 15, 1966, 49 y.o.   MRN: [id number]   Reason for Appointment: Diabetes follow-up    History of Present Illness:   Diagnosis: Type 2 diabetes mellitus, date of diagnosis: 2004.  PAST history: She has been treated mostly with insulin since about a year after diagnosis. She has had difficulty with consistent compliance with diet and also compliance with self care including glucose monitoring over the years. She had been mostly treated with basal insulin. Also had been tried on mealtime insulin but she would be noncompliant with this. Was tried on Prandin for mealtime control but difficult to judge efficacy because of lack of postprandial monitoring. Was given Victoza to start in 2011 but did not follow up after this.She was tried on premixed insulin but this did not help her control, mostly because of noncompliance with the doses. She had been using the V- go pump and had better control initially and was better compliant with the daily routine and boluses. She has had frequent education visits also. She stopped using her V.-go pump in 6/15 because of discomfort at the site of application   RECENT history:    INSULIN dose: Lantus 50 units qd    Her A1c previously has been consistently under 7% and low her blood sugars are much higher Does not appear to be recently  benefiting from [facility] which she thinks she is taking daily She is again not checking blood sugars at home and is not motivated She says she has gained a lot of weight from quitting smoking Her blood sugar is 377 in the lab and this was after a lot of regular soft drinks most likely  Current management and problems identified:  She has a higher A1c on this visit  Weight gain is likely to be from increasing her regular soft drinks and overall caloric intake as well as continued and activity  She also got steroids last month from her PCP for facial swelling  She is not able  to do any walking because of pain in her legs  Glucometer:  FreeStyle.  Checking blood sugar very sporadically and usually not after meals Blood Glucose readings: n/a  Hypoglycemia frequency: Never.    Food preferences: eating 1 or 2 meals per day, variable intake, sandwiches at times or otherwise snacks  Physical activity: exercise: Minimal.  She has difficulties with leg pain Certified Diabetes Educator visit: Most recent:, 2/14.  The diet that the patient has been following is no specific diet; still eating at irregular times; still drinking drinks with sugar claiming she is allergic to artificial sweeteners   Wt Readings from Last 3 Encounters:  11/12/15 216 lb 6.4 oz (98.158 kg)  09/08/15 209 lb (94.802 kg)  08/12/15 205 lb 6.4 oz (93.169 kg)   DM labs:  Lab Results  Component Value Date   HGBA1C 8.3* 11/09/2015   HGBA1C 6.8 08/12/2015   HGBA1C 6.6* 04/14/2015   Lab Results  Component Value Date   MICROALBUR <0.7 08/12/2015   LDLCALC 49 08/12/2015   CREATININE 0.99 11/09/2015    Appointment on 11/09/2015  Component Date Value Ref Range Status  . Hgb A1c MFr Bld 11/09/2015 8.3* 4.6 - 6.5 % Final   Glycemic Control Guidelines for People with Diabetes:Non Diabetic:  <6%Goal of Therapy: <7%Additional Action Suggested:  >8%   . Sodium 11/09/2015 135  135 - 145 mEq/L Final  . Potassium 11/09/2015 3.1* 3.5 - 5.1 mEq/L Final  .  Chloride 11/09/2015 98  96 - 112 mEq/L Final  . CO2 11/09/2015 25  19 - 32 mEq/L Final  . Glucose, Bld 11/09/2015 377* 70 - 99 mg/dL Final  . BUN 11/09/2015 6  6 - 23 mg/dL Final  . Creatinine, Ser 11/09/2015 0.99  0.40 - 1.20 mg/dL Final  . Total Bilirubin 11/09/2015 0.3  0.2 - 1.2 mg/dL Final  . Alkaline Phosphatase 11/09/2015 84  39 - 117 U/L Final  . AST 11/09/2015 17  0 - 37 U/L Final  . ALT 11/09/2015 20  0 - 35 U/L Final  . Total Protein 11/09/2015 6.4  6.0 - 8.3 g/dL Final  . Albumin 11/09/2015 3.7  3.5 - 5.2 g/dL Final  . Calcium  11/09/2015 9.3  8.4 - 10.5 mg/dL Final  . GFR 11/09/2015 62.99  >60.00 mL/min Final    Complications: are: peripheral neuropathy and she is still fairly symptomatic with this.       Medication List       This list is accurate as of: 11/12/15  2:56 PM.  Always use your most recent med list.               amphetamine-dextroamphetamine 30 MG tablet  Commonly known as:  ADDERALL  Take 1 tablet by mouth 2 (two) times daily.     aspirin 325 MG tablet  Take 1 tablet (325 mg total) by mouth daily.     atorvastatin 40 MG tablet  Commonly known as:  LIPITOR  TAKE 1 TABLET (40 MG TOTAL) BY MOUTH DAILY FOR CHOLESTEROL. (NEEDS OFFICE VISIT!)     Vortioxetine HBr 5 MG Tabs  Take 5 mg by mouth daily.     TRINTELLIX 20 MG Tabs  Generic drug:  Vortioxetine HBr  Take 5 mg by mouth.     BRINTELLIX 20 MG Tabs  Generic drug:  Vortioxetine HBr  Take 20 mg by mouth daily. Takes with the 5mg  tablet for a total dose of 25mg      canagliflozin 300 MG Tabs tablet  Commonly known as:  INVOKANA  Take 300 mg by mouth daily before breakfast.     CHANTIX 0.5 MG tablet  Generic drug:  varenicline     chlorproMAZINE 200 MG tablet  Commonly known as:  THORAZINE  Take 200 mg by mouth at bedtime.     diazepam 10 MG tablet  Commonly known as:  VALIUM  Take 20 mg by mouth at bedtime.     esomeprazole 40 MG capsule  Commonly known as:  NEXIUM  Take 40 mg by mouth 2 (two) times daily before a meal.     fenofibrate 145 MG tablet  Commonly known as:  TRICOR  TAKE 1 TABLET (145 MG TOTAL) BY MOUTH DAILY.     insulin aspart 100 UNIT/ML FlexPen  Commonly known as:  NOVOLOG FLEXPEN  Inject 12 Units into the skin 3 (three) times daily with meals.     LANTUS SOLOSTAR 100 UNIT/ML Solostar Pen  Generic drug:  Insulin Glargine  INJECT 46 UNITS DAILY AT THE SAME TIME EVERY DAY. IF SUGAR GOES ABOVE 200 INCREASE DOSE TO 50     oxyCODONE-acetaminophen 10-325 MG tablet  Commonly known as:  PERCOCET    Take 1 tablet by mouth 3 (three) times daily as needed for pain.     potassium chloride SA 20 MEQ tablet  Commonly known as:  K-DUR,KLOR-CON  Take 1 tablet (20 mEq total) by mouth daily.     RAPAFLO 8 MG  Caps capsule  Generic drug:  silodosin  Take 8 mg by mouth daily.     rizatriptan 10 MG tablet  Commonly known as:  MAXALT  Take 1 tablet (10 mg total) by mouth as needed for migraine. May repeat in 2 hours if needed     spironolactone 25 MG tablet  Commonly known as:  ALDACTONE  TAKE 2 TABLETS (50 MG TOTAL) BY MOUTH 2 (TWO) TIMES DAILY.     tamsulosin 0.4 MG Caps capsule  Commonly known as:  FLOMAX  Take 0.4 mg by mouth daily.     tiZANidine 4 MG tablet  Commonly known as:  ZANAFLEX  Take 8 mg by mouth daily.     VICTOZA 18 MG/3ML Sopn  Generic drug:  Liraglutide  Inject 0.2 mLs (1.2 mg total) into the skin daily. Inject once daily at the same time        Allergies:  Allergies  Allergen Reactions  . Latex Itching, Rash and Other (See Comments)    Pt states she cannot use condoms - cause an infection.  Use of latex on skin is okay.  Tape causes rash  . Ibuprofen Other (See Comments)    headaches  . Sweetening Enhancer [Flavoring Agent] Nausea And Vomiting    And headaches  . Tetracyclines & Related Nausea And Vomiting    Yeast infections  . Trazodone And Nefazodone Other (See Comments)    Hallucinations   . Triazolam Other (See Comments)    hallucinations  . Aspartame And Phenylalanine Nausea And Vomiting    headaches    Past Medical History  Diagnosis Date  . Hypertension   . Hyperlipemia   . GERD (gastroesophageal reflux disease)   . Insomnia   . SUI (stress urinary incontinence, female) S/P SLING 12-29-2011  . SOB (shortness of breath) on exertion   . Bipolar disorder ([place])   . Neurogenic bladder   . Hypercholesterolemia   . Gastroparesis   . Ulcer   . MRSA (methicillin resistant Staphylococcus aureus)   . Chronic pain   . Edema   . Chest pain      a. 2008 Cath: normal cors;  b. 12/2013 Lexi CL: EF 47%, no ischemia/infarct.  . Anxiety   . Vertigo   . COPD (chronic obstructive pulmonary disease) ([place])   . Cholecystitis   . Hypersomnia   . Tobacco abuse   . Claudication ([place])     a. 12/2013 ABI's: R 0.97, L 0.94.  [name] Palpitations   . Anginal pain ([place])   . Pneumonia     "twice, I think" (02/12/2014)  . Sleep apnea     didn't tolerate cpap (02/12/2014)  . Type II diabetes mellitus ([place])   . History of blood transfusion 1986    "related to lost a child" (02/12/2014)  . H/O hiatal hernia   . [id number])     "weekly" (02/12/2014)  . Stroke ([place]) 02/12/2014    "eyesight is messed up; not steady on my feet" (02/12/2014)  . Arthritis     "qwhere" (02/12/2014)  . DJD (degenerative joint disease)   . Chronic back pain   . Depression   . Personality disorder   . OSA (obstructive sleep apnea) 07/04/2014    Past Surgical History  Procedure Laterality Date  . Knee arthroscopy w/ allograft impant Left     graft x 2  . Foot surgery Bilateral   . Anterior cervical decomp/discectomy fusion  2000    C6 - 7  . Pubovaginal sling  12/29/2011    Procedure: Gaynelle Arabian;  Surgeon: Reece Packer, MD;  Location: [facility];  Service: Urology;  Laterality: N/A;  cysto and sparc sling   . Lumbar fusion      "cage in my spine"  . Total abdominal hysterectomy w/ bilateral salpingoophorectomy  1997  . Multiple laparoscopies for endometriosis    . Cesarean section  1989; 1992  . Repeat reconstruction acl left knee/ screws removed  03-28-2000    CADAVER GRAFT  . 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 Knee surgery      TOTAL 8 SURG'S  . Cystoscopy with injection  05/04/2012    Procedure: CYSTOSCOPY WITH INJECTION;  Surgeon: Reece Packer, MD;  Location: [facility];  Service: Urology;  Laterality: N/A;  MACROPLASTIQUE INJECTION  . Cystoscopy with injection  08/28/2012    Procedure:  CYSTOSCOPY WITH INJECTION;  Surgeon: Reece Packer, MD;  Location: [facility];  Service: Urology;  Laterality: N/A;  cysto and macroplastique   . Carpal tunnel release Right 04-25-2013  . Cysto N/A 04/30/2013    Procedure: CYSTOSCOPY;  Surgeon: Reece Packer, MD;  Location: [facility];  Service: Urology;  Laterality: N/A;  . Pubovaginal sling N/A 04/30/2013    Procedure: REMOVAL OF VAGINAL MESH;  Surgeon: Reece Packer, MD;  Location: [facility];  Service: Urology;  Laterality: N/A;  . Abdominal hysterectomy    . Hernia repair  ?1996    "stomach"  . Cardiac catheterization  05-22-2008   DR SKAINS    NO SIGNIFECANT CAD/ NORMAL LV/  EF 65%/  NO WALL MOTION ABNORMALITIES  . Cardiac catheterization  02/12/2014  . Left heart catheterization with coronary angiogram N/A 02/12/2014    Procedure: LEFT HEART CATHETERIZATION WITH CORONARY ANGIOGRAM;  Surgeon: Candee Furbish, MD;  Location: [facility] CATH LAB;  Service: Cardiovascular;  Laterality: N/A;  . Laparoscopic sigmoid colectomy  04/22/2015  . Laparoscopic sigmoid colectomy N/A 04/22/2015    Procedure: LAPAROSCOPIC HAND ASSISTED SIGMOID COLECTOMY;  Surgeon: Erroll Luna, MD;  Location: [facility] OR;  Service: General;  Laterality: N/A;    Family History  Problem Relation Age of Onset  . Hypertension Mother   . Diabetes Mother   . Cancer - Other Mother     lymphoma   . Cancer - Other Father     lung, bladder cancer   . Heart attack Father   . Cancer - Other Brother     bladder cancer     Social History:  reports that she has been smoking Cigarettes and E-cigarettes.  She has a 41 pack-year smoking history. She has never used smokeless tobacco. She reports that she drinks alcohol. She reports that she does not use illicit drugs.  Review of Systems -   She has history of high triglycerides treated with fenofibrate but she stopped taking this, does not understand why she needs to take this in addition to Lipitor.   Currently she is taking  Lipitor    Lab Results  Component Value Date   CHOL 124 08/12/2015   HDL 39.50 08/12/2015   LDLCALC 49 08/12/2015   LDLDIRECT 57.0 03/09/2015   TRIG 177.0* 08/12/2015   CHOLHDL 3 08/12/2015    Hypertension: This has been relatively mild and controlled . She is taking spironolactone 1 tablets daily Not taking ramipril for some time  HYPOKALEMIA: This has recurred and this is despite taking Aldactone 25 mg She is complaining of some cramps asneeded  night   Painful neuropathy: She has had continued symptoms including leg pains and numbness as well as difficulty with balance and has tried various drugs including gabapentin without much relief  Foot exam shows absent pedal pulses and distal sensation   Examination:   BP 122/72 mmHg  Pulse 85  Temp(Src) 98.7 F (37.1 C)  Resp 14  Ht 5\' 2"  (1.575 m)  Wt 216 lb 6.4 oz (98.158 kg)  BMI 39.57 kg/m2  SpO2 97%  Body mass index is 39.57 kg/(m^2).   She has significant  facial puffiness  No cushingoid  features Thyroid not palpable. No ankle edema Biceps reflexes are normal    Assesment/Plan:   1. Diabetes type 2, with fair control   The patient's diabetes is much worse with A1c increasing to over 8 % and lab glucose 377 This is likely to be from her noncompliance with diet and also getting some steroids last month She is consuming large amounts of drinks with sugar and gaining weight Also unable to exercise She is compliant with her insulin and 300 mg of Invokana but most likely has postprandial hyperglycemia His noncompliant with checking her sugar   Discussed that it is important that she started watching her diet and eliminating drinks which sugar and drinking more water.  Since she had benefited from Devola previously will start this again.  Discussed how this works, benefits and side effects then doses titration.  She may need to reduce her Lantus based on fasting readings  She need to check her sugar more  consistently while starting [place] her not to keep her pen needle on continuously which she is doing and change it everyday  2. Hypertension: Blood pressure is  normal with Aldactone only  3.  Hypokalemia: This is out of control and likely to be from her hyperglycemia, also needs to assess her magnesium level She will increase her Aldactone to 50 mg and start potassium for at least 2 weeks  4.  Lipids: Restart fenofibrate, explained why she needs to take both this and Lipitor  5.  Swelling of face: Not clear why this is occurring, will check her thyroid levels on the next visit again.  Consider dermatology consultation  Patient Instructions  Check blood sugars on waking up 4-5  times a week Also check blood sugars about 2 hours after a meal and do this after different meals by rotation  Recommended blood sugar levels on waking up is 90-130 and about 2 hours after meal is 130-160  Please bring your blood sugar monitor to each visit, thank you  Start VICTOZA injection as shown once daily at the same time of the day.  Dial the dose to 0.6 mg on the pen for the first week.   You may inject in the stomach, thigh or arm. You may experience nausea in the first few days which usually goes away.   You will feel fullness of the stomach with starting the medication and should try to keep the portions at meals small. After 1 week increase the dose to 1.2mg  daily if no nausea present.   If any questions or concerns are present call the office or the [place] helpline at [phone]. Visit [email or web address].info/ for more useful information  Cut Lantus to 45 when am sugar < 90 in the ams  Spirono take 2 daily  Restart fenofibrate      Counseling time on subjects discussed above is over 50% of today's 25  minute visit   Icey Tello 11/12/2015, 2:56 PM

## 2015-11-17 ENCOUNTER — Telehealth: Payer: Self-pay | Admitting: Cardiology

## 2015-11-17 ENCOUNTER — Ambulatory Visit (INDEPENDENT_AMBULATORY_CARE_PROVIDER_SITE_OTHER): Payer: Medicare Other | Admitting: Cardiology

## 2015-11-17 ENCOUNTER — Encounter: Payer: Self-pay | Admitting: Cardiology

## 2015-11-17 VITALS — BP 132/80 | HR 94 | Ht 62.0 in | Wt 214.1 lb

## 2015-11-17 DIAGNOSIS — Z720 Tobacco use: Secondary | ICD-10-CM | POA: Diagnosis not present

## 2015-11-17 DIAGNOSIS — E785 Hyperlipidemia, unspecified: Secondary | ICD-10-CM

## 2015-11-17 DIAGNOSIS — R220 Localized swelling, mass and lump, head: Secondary | ICD-10-CM

## 2015-11-17 DIAGNOSIS — I519 Heart disease, unspecified: Secondary | ICD-10-CM

## 2015-11-17 DIAGNOSIS — I10 Essential (primary) hypertension: Secondary | ICD-10-CM

## 2015-11-17 DIAGNOSIS — G4733 Obstructive sleep apnea (adult) (pediatric): Secondary | ICD-10-CM

## 2015-11-17 MED ORDER — ATORVASTATIN CALCIUM 40 MG PO TABS: 40.0000 mg | ORAL_TABLET | Freq: Every day | ORAL | Status: DC

## 2015-11-17 NOTE — Progress Notes (Signed)
Cardiology Office Note   Date:  11/17/2015   ID:  [name], DOB 10-Dec-1966, MRN [id number]  PCP:  Tamsen Roers, MD  Cardiologist:  Dr. Marlou Porch    Chief Complaint  Patient presents with  . Hyperlipidemia    needs refills      History of Present Illness: [name] is a 49 y.o. female who presents for hyperlipidemia with need for refills.    She has a hx of cardiac catheterization on 02/12/14 after abnormal stress test which revealed normal coronary arteries thankfully however approximately 3 hours after procedure and she was getting up to go to the bathroom, she noted diplopia and had a third nerve palsy, intranuclear ophthalmoplegia. Code stroke was called, neurology assessed her and she was monitored in the hospital for the next 24-48 hours. During that same period of time.  She also has EF of 30-35% but with cath EF was normal.  Since cath her echo in 01/2014 was 45%.   She has Hx of tobacco use, DM on insulin, peripheral neuropathy, peripheral edema -support hose. Cleared for Adderall.  OSA-but unable to wear CPAP.  Her fenofibrate was recently resumed.  Today she has no chest pain or SOB.  She has facial swelling now off and on for 3 months. She has taken steroids which helps but it returns after ending steroids. Her PCP is following.   She has stopped cigarettes in [place]. and is using e cigarettes and hopes to wean the tobacco.  She stated the facial swelling began before the E cigarettes..      Past Medical History  Diagnosis Date  . Hypertension   . Hyperlipemia   . GERD (gastroesophageal reflux disease)   . Insomnia   . SUI (stress urinary incontinence, female) S/P SLING 12-29-2011  . SOB (shortness of breath) on exertion   . Bipolar disorder ([place])   . Neurogenic bladder   . Hypercholesterolemia   . Gastroparesis   . Ulcer   . MRSA (methicillin resistant Staphylococcus aureus)   . Chronic pain   . Edema   . Chest pain     a. 2008 Cath: normal cors;  b.  12/2013 Lexi CL: EF 47%, no ischemia/infarct.  . Anxiety   . Vertigo   . COPD (chronic obstructive pulmonary disease) ([place])   . Cholecystitis   . Hypersomnia   . Tobacco abuse   . Claudication ([place])     a. 12/2013 ABI's: R 0.97, L 0.94.  [name] Palpitations   . Anginal pain ([place])   . Pneumonia     "twice, I think" (02/12/2014)  . Sleep apnea     didn't tolerate cpap (02/12/2014)  . Type II diabetes mellitus ([place])   . History of blood transfusion 1986    "related to lost a child" (02/12/2014)  . H/O hiatal hernia   . [id number])     "weekly" (02/12/2014)  . Stroke ([place]) 02/12/2014    "eyesight is messed up; not steady on my feet" (02/12/2014)  . Arthritis     "qwhere" (02/12/2014)  . DJD (degenerative joint disease)   . Chronic back pain   . Depression   . Personality disorder   . OSA (obstructive sleep apnea) 07/04/2014    Past Surgical History  Procedure Laterality Date  . Knee arthroscopy w/ allograft impant Left     graft x 2  . Foot surgery Bilateral   . Anterior cervical decomp/discectomy fusion  2000    C6 - 7  . Pubovaginal  sling  12/29/2011    Procedure: Gaynelle Arabian;  Surgeon: Reece Packer, MD;  Location: [facility];  Service: Urology;  Laterality: N/A;  cysto and sparc sling   . Lumbar fusion      "cage in my spine"  . Total abdominal hysterectomy w/ bilateral salpingoophorectomy  1997  . Multiple laparoscopies for endometriosis    . Cesarean section  1989; 1992  . Repeat reconstruction acl left knee/ screws removed  03-28-2000    CADAVER GRAFT  . 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 Knee surgery      TOTAL 8 SURG'S  . Cystoscopy with injection  05/04/2012    Procedure: CYSTOSCOPY WITH INJECTION;  Surgeon: Reece Packer, MD;  Location: [facility];  Service: Urology;  Laterality: N/A;  MACROPLASTIQUE INJECTION  . Cystoscopy with injection  08/28/2012    Procedure: CYSTOSCOPY WITH INJECTION;  Surgeon: Reece Packer, MD;  Location: [facility];  Service: Urology;  Laterality: N/A;  cysto and macroplastique   . Carpal tunnel release Right 04-25-2013  . Cysto N/A 04/30/2013    Procedure: CYSTOSCOPY;  Surgeon: Reece Packer, MD;  Location: [facility];  Service: Urology;  Laterality: N/A;  . Pubovaginal sling N/A 04/30/2013    Procedure: REMOVAL OF VAGINAL MESH;  Surgeon: Reece Packer, MD;  Location: [facility];  Service: Urology;  Laterality: N/A;  . Abdominal hysterectomy    . Hernia repair  ?1996    "stomach"  . Cardiac catheterization  05-22-2008   DR SKAINS    NO SIGNIFECANT CAD/ NORMAL LV/  EF 65%/  NO WALL MOTION ABNORMALITIES  . Cardiac catheterization  02/12/2014  . Left heart catheterization with coronary angiogram N/A 02/12/2014    Procedure: LEFT HEART CATHETERIZATION WITH CORONARY ANGIOGRAM;  Surgeon: Candee Furbish, MD;  Location: [facility] C[facility];  Service: Cardiovascular;  Laterality: N/A;  . Laparoscopic sigmoid colectomy  04/22/2015  . Laparoscopic sigmoid colectomy N/A 04/22/2015    Procedure: LAPAROSCOPIC HAND ASSISTED SIGMOID COLECTOMY;  Surgeon: Erroll Luna, MD;  Location: [facility];  Service: General;  Laterality: N/A;     Current Outpatient Prescriptions  Medication Sig Dispense Refill  . amphetamine-dextroamphetamine (ADDERALL) 30 MG tablet Take 1 tablet by mouth 2 (two) times daily.  0  . atorvastatin (LIPITOR) 40 MG tablet Take 1 tablet (40 mg total) by mouth daily at 6 PM. 90 tablet 3  . canagliflozin (INVOKANA) 300 MG TABS tablet Take 300 mg by mouth daily before breakfast. 30 tablet 3  . CHANTIX 0.5 MG tablet     . chlorproMAZINE (THORAZINE) 200 MG tablet Take 200 mg by mouth at bedtime.    . diazepam (VALIUM) 10 MG tablet Take 20 mg by mouth at bedtime.     [name] esomeprazole (NEXIUM) 40 MG capsule Take 40 mg by mouth 2 (two) times daily before a meal.    . fenofibrate (TRICOR) 145 MG tablet TAKE 1 TABLET (145 MG TOTAL) BY MOUTH DAILY. 30 tablet 5  . LANTUS  SOLOSTAR 100 UNIT/ML Solostar Pen INJECT 46 UNITS DAILY AT THE SAME TIME EVERY DAY. IF SUGAR GOES ABOVE 200 INCREASE DOSE TO 50 (Patient taking differently: INJECT 50 UNITS DAILY AT THE SAME TIME EVERY DAY. IF SUGAR GOES ABOVE 200 INCREASE DOSE TO 50) 45 mL 2  . oxyCODONE-acetaminophen (PERCOCET) 10-325 MG per tablet Take 1 tablet by mouth 3 (three) times daily as needed for pain.    . potassium chloride SA (  K-DUR,KLOR-CON) 20 MEQ tablet Take 1 tablet (20 mEq total) by mouth daily. 30 tablet 0  . rizatriptan (MAXALT) 10 MG tablet Take 1 tablet (10 mg total) by mouth as needed for migraine. May repeat in 2 hours if needed 10 tablet 3  . spironolactone (ALDACTONE) 25 MG tablet TAKE 2 TABLETS (50 MG TOTAL) BY MOUTH 2 (TWO) TIMES DAILY. 60 tablet 2  . VICTOZA 18 MG/3ML SOPN Inject 0.2 mLs (1.2 mg total) into the skin daily. Inject once daily at the same time 2 pen 3  . Vortioxetine HBr (TRINTELLIX) 20 MG TABS Take 5 mg by mouth.    . Vortioxetine HBr 5 MG TABS Take 5 mg by mouth daily.     No current facility-administered medications for this visit.    Allergies:   Latex; Ibuprofen; Sweetening enhancer; Tetracyclines & related; Trazodone and nefazodone; Triazolam; and Aspartame and phenylalanine    Social History:  The patient  reports that she has been smoking Cigarettes and E-cigarettes.  She has a 41 pack-year smoking history. She has never used smokeless tobacco. She reports that she drinks alcohol. She reports that she does not use illicit drugs.   Family History:  The patient's family history includes Cancer - Other in her brother, father, and mother; Diabetes in her mother; Heart attack in her father; Hypertension in her mother.    ROS:  General:no colds or fevers, no weight changes Skin:no rashes or ulcers HEENT:no blurred vision, no congestion, + facial swelling CV:see HPI PUL:see HPI GI:no diarrhea constipation or melena, no indigestion GU:no hematuria, no dysuria MS:no joint pain,  no claudication Neuro:no syncope, no lightheadedness Endo:+ diabetes followed by Dr. Dwyane Dee, no thyroid disease  Wt Readings from Last 3 Encounters:  11/17/15 214 lb 1.9 oz (97.124 kg)  11/12/15 216 lb 6.4 oz (98.158 kg)  09/08/15 209 lb (94.802 kg)     PHYSICAL EXAM: VS:  BP 132/80 mmHg  Pulse 94  Ht 5\' 2"  (1.575 m)  Wt 214 lb 1.9 oz (97.124 kg)  BMI 39.15 kg/m2 , BMI Body mass index is 39.15 kg/(m^2). General:Pleasant affect, NAD Skin:Warm and dry, brisk capillary refill HEENT:normocephalic, sclera clear, mucus membranes moist. + facial swelling  Neck:supple, no JVD, no bruits  Heart:S1S2 RRR without murmur, gallup, rub or click Lungs:clear without rales, rhonchi, or wheezes [id number], non tender, + BS, do not palpate liver spleen or masses Ext:no lower ext edema, 2+ pedal pulses, 2+ radial pulses Neuro:alert and oriented X 3, MAE, follows commands, + facial symmetry    EKG:  EKG is ordered today. The ekg ordered today demonstrates SR her Qtc is 482 ms similar to previous EKG. No acute changes.    Recent Labs: 09/08/2015: HGB 12.9; Platelets 223 11/09/2015: ALT 20; BUN 6; Creatinine, Ser 0.99; Potassium 3.1*; Sodium 135    Lipid Panel    Component Value Date/Time   CHOL 124 08/12/2015 1433   TRIG 177.0* 08/12/2015 1433   HDL 39.50 08/12/2015 1433   CHOLHDL 3 08/12/2015 1433   VLDL 35.4 08/12/2015 1433   LDLCALC 49 08/12/2015 1433   LDLDIRECT 57.0 03/09/2015 1439       Other studies Reviewed: Additional studies/ records that were reviewed today include: previous cath, echo an OV..   ASSESSMENT AND PLAN:  1.  Hyperlipidemia refill lipitor continue current dose lipids as above  2. LV dysfunction though normal at cath with patent coronary arteries, euvolemic, follow up with Dr.Skains in 6 mionths  3. OSA unable to wear cpap  4.  Facial swelling follow up with PCP  5. Tobacco use, is trying to stop using E cigarettes.  6. DM followed by Dr.  Dwyane Dee.   Current medicines are reviewed with the patient today.  The patient Has no concerns regarding medicines.  The following changes have been made:  See above Labs/ tests ordered today include:see above  Disposition:   FU:  see above  Lennie Muckle, NP  11/17/2015 6:02 PM    [facility] [place], [place], [address] [place], [place] Phone: [phone]; Fax: [phone]

## 2015-11-17 NOTE — Patient Instructions (Signed)
Medication Instructions:  None  Labwork: None  Testing/Procedures: None  Follow-Up: Your physician wants you to follow-up in: 6 months with Dr. Marlou Porch. You will receive a reminder letter in the mail two months in advance. If you don't receive a letter, please call our office to schedule the follow-up appointment.   Any Other Special Instructions Will Be Listed Below (If Applicable).     If you need a refill on your cardiac medications before your next appointment, please call your pharmacy.

## 2015-11-17 NOTE — Telephone Encounter (Signed)
No call

## 2015-11-24 ENCOUNTER — Other Ambulatory Visit: Payer: Self-pay | Admitting: Endocrinology

## 2015-11-24 NOTE — Telephone Encounter (Signed)
I phoned her and she does not want this now, because she says the doctor told her that she needs to still prick her finger.  I confirmed this, and she says she is not interested in this now.

## 2015-11-25 ENCOUNTER — Encounter (HOSPITAL_BASED_OUTPATIENT_CLINIC_OR_DEPARTMENT_OTHER): Payer: Medicare Other | Attending: Surgery

## 2015-11-25 ENCOUNTER — Emergency Department (HOSPITAL_COMMUNITY)
Admission: EM | Admit: 2015-11-25 | Discharge: 2015-11-25 | Disposition: A | Discharge status: Home / Self Care | Payer: Medicare Other | Attending: Emergency Medicine | Admitting: Emergency Medicine

## 2015-11-25 ENCOUNTER — Encounter (HOSPITAL_COMMUNITY): Payer: Self-pay | Admitting: Emergency Medicine

## 2015-11-25 DIAGNOSIS — N3000 Acute cystitis without hematuria: Secondary | ICD-10-CM

## 2015-11-25 DIAGNOSIS — E11621 Type 2 diabetes mellitus with foot ulcer: Secondary | ICD-10-CM | POA: Diagnosis present

## 2015-11-25 DIAGNOSIS — K3184 Gastroparesis: Secondary | ICD-10-CM | POA: Insufficient documentation

## 2015-11-25 DIAGNOSIS — G8929 Other chronic pain: Secondary | ICD-10-CM | POA: Diagnosis not present

## 2015-11-25 DIAGNOSIS — F329 Major depressive disorder, single episode, unspecified: Secondary | ICD-10-CM | POA: Diagnosis not present

## 2015-11-25 DIAGNOSIS — Z8673 Personal history of transient ischemic attack (TIA), and cerebral infarction without residual deficits: Secondary | ICD-10-CM | POA: Diagnosis not present

## 2015-11-25 DIAGNOSIS — E1143 Type 2 diabetes mellitus with diabetic autonomic (poly)neuropathy: Secondary | ICD-10-CM | POA: Insufficient documentation

## 2015-11-25 DIAGNOSIS — E1121 Type 2 diabetes mellitus with diabetic nephropathy: Secondary | ICD-10-CM | POA: Insufficient documentation

## 2015-11-25 DIAGNOSIS — Z9889 Other specified postprocedural states: Secondary | ICD-10-CM | POA: Diagnosis not present

## 2015-11-25 DIAGNOSIS — F17290 Nicotine dependence, other tobacco product, uncomplicated: Secondary | ICD-10-CM | POA: Diagnosis not present

## 2015-11-25 DIAGNOSIS — E1165 Type 2 diabetes mellitus with hyperglycemia: Secondary | ICD-10-CM | POA: Diagnosis present

## 2015-11-25 DIAGNOSIS — Z85038 Personal history of other malignant neoplasm of large intestine: Secondary | ICD-10-CM | POA: Diagnosis not present

## 2015-11-25 DIAGNOSIS — Z8614 Personal history of Methicillin resistant Staphylococcus aureus infection: Secondary | ICD-10-CM | POA: Insufficient documentation

## 2015-11-25 DIAGNOSIS — F17210 Nicotine dependence, cigarettes, uncomplicated: Secondary | ICD-10-CM | POA: Diagnosis not present

## 2015-11-25 DIAGNOSIS — Z91040 Latex allergy status: Secondary | ICD-10-CM | POA: Diagnosis not present

## 2015-11-25 DIAGNOSIS — E1140 Type 2 diabetes mellitus with diabetic neuropathy, unspecified: Secondary | ICD-10-CM | POA: Insufficient documentation

## 2015-11-25 DIAGNOSIS — E7800 Pure hypercholesterolemia, unspecified: Secondary | ICD-10-CM | POA: Insufficient documentation

## 2015-11-25 DIAGNOSIS — Z794 Long term (current) use of insulin: Secondary | ICD-10-CM | POA: Insufficient documentation

## 2015-11-25 DIAGNOSIS — L97412 Non-pressure chronic ulcer of right heel and midfoot with fat layer exposed: Secondary | ICD-10-CM | POA: Diagnosis not present

## 2015-11-25 DIAGNOSIS — Z8701 Personal history of pneumonia (recurrent): Secondary | ICD-10-CM | POA: Diagnosis not present

## 2015-11-25 DIAGNOSIS — J449 Chronic obstructive pulmonary disease, unspecified: Secondary | ICD-10-CM | POA: Insufficient documentation

## 2015-11-25 DIAGNOSIS — Z9049 Acquired absence of other specified parts of digestive tract: Secondary | ICD-10-CM | POA: Insufficient documentation

## 2015-11-25 DIAGNOSIS — M1990 Unspecified osteoarthritis, unspecified site: Secondary | ICD-10-CM | POA: Insufficient documentation

## 2015-11-25 DIAGNOSIS — I10 Essential (primary) hypertension: Secondary | ICD-10-CM | POA: Insufficient documentation

## 2015-11-25 DIAGNOSIS — Z79899 Other long term (current) drug therapy: Secondary | ICD-10-CM | POA: Insufficient documentation

## 2015-11-25 DIAGNOSIS — F419 Anxiety disorder, unspecified: Secondary | ICD-10-CM | POA: Diagnosis not present

## 2015-11-25 DIAGNOSIS — R739 Hyperglycemia, unspecified: Secondary | ICD-10-CM

## 2015-11-25 DIAGNOSIS — K219 Gastro-esophageal reflux disease without esophagitis: Secondary | ICD-10-CM | POA: Insufficient documentation

## 2015-11-25 DIAGNOSIS — G4730 Sleep apnea, unspecified: Secondary | ICD-10-CM | POA: Diagnosis not present

## 2015-11-25 DIAGNOSIS — E118 Type 2 diabetes mellitus with unspecified complications: Secondary | ICD-10-CM

## 2015-11-25 DIAGNOSIS — E1151 Type 2 diabetes mellitus with diabetic peripheral angiopathy without gangrene: Secondary | ICD-10-CM | POA: Insufficient documentation

## 2015-11-25 LAB — COMPREHENSIVE METABOLIC PANEL
ALBUMIN: 3.7 g/dL (ref 3.5–5.0)
ALT: 18 U/L (ref 14–54)
ANION GAP: 9 (ref 5–15)
AST: 15 U/L (ref 15–41)
Alkaline Phosphatase: 66 U/L (ref 38–126)
BILIRUBIN TOTAL: 0.5 mg/dL (ref 0.3–1.2)
BUN: 12 mg/dL (ref 6–20)
CO2: 25 mmol/L (ref 22–32)
Calcium: 9.1 mg/dL (ref 8.9–10.3)
Chloride: 96 mmol/L — ABNORMAL LOW (ref 101–111)
Creatinine, Ser: 1.07 mg/dL — ABNORMAL HIGH (ref 0.44–1.00)
GFR calc Af Amer: >60 mL/min (ref >60)
GFR, EST NON AFRICAN AMERICAN: 59 mL/min — AB (ref >60)
GLUCOSE: 442 mg/dL — AB (ref 65–99)
POTASSIUM: 3.5 mmol/L (ref 3.5–5.1)
Sodium: 130 mmol/L — ABNORMAL LOW (ref 135–145)
TOTAL PROTEIN: 6.7 g/dL (ref 6.5–8.1)

## 2015-11-25 LAB — URINALYSIS, ROUTINE W REFLEX MICROSCOPIC
Bilirubin Urine: NEGATIVE
Glucose, UA: >1000 mg/dL — AB
Ketones, ur: NEGATIVE mg/dL
Nitrite: POSITIVE — AB
PROTEIN: NEGATIVE mg/dL
Specific Gravity, Urine: 1.028 (ref 1.005–1.030)
pH: 6 (ref 5.0–8.0)

## 2015-11-25 LAB — CBC WITH DIFFERENTIAL/PLATELET
BASOS PCT: 1 %
Basophils Absolute: 0.1 10*3/uL (ref 0.0–0.1)
EOS PCT: 0 %
Eosinophils Absolute: 0 10*3/uL (ref 0.0–0.7)
HEMATOCRIT: 36.6 % (ref 36.0–46.0)
Hemoglobin: 12.4 g/dL (ref 12.0–15.0)
Lymphocytes Relative: 26 %
Lymphs Abs: 2.8 10*3/uL (ref 0.7–4.0)
MCH: 29.7 pg (ref 26.0–34.0)
MCHC: 33.9 g/dL (ref 30.0–36.0)
MCV: 87.8 fL (ref 78.0–100.0)
MONO ABS: 0.5 10*3/uL (ref 0.1–1.0)
MONOS PCT: 5 %
NEUTROS ABS: 7.6 10*3/uL (ref 1.7–7.7)
Neutrophils Relative %: 68 %
Platelets: 248 10*3/uL (ref 150–400)
RBC: 4.17 MIL/uL (ref 3.87–5.11)
RDW: 13.9 % (ref 11.5–15.5)
WBC: 11 10*3/uL — ABNORMAL HIGH (ref 4.0–10.5)

## 2015-11-25 LAB — CBG MONITORING, ED
Glucose-Capillary: 502 mg/dL — ABNORMAL HIGH (ref 65–99)
Glucose-Capillary: 260 mg/dL — ABNORMAL HIGH (ref 65–99)

## 2015-11-25 LAB — GLUCOSE, CAPILLARY: Glucose-Capillary: 550 mg/dL — ABNORMAL HIGH (ref 65–99)

## 2015-11-25 LAB — URINE MICROSCOPIC-ADD ON

## 2015-11-25 MED ORDER — SODIUM CHLORIDE 0.9 % IV BOLUS (SEPSIS)
1000.0000 mL | Freq: Once | INTRAVENOUS | Status: AC
Start: 2015-11-25 — End: 2015-11-25
  Administered 2015-11-25: 1000 mL via INTRAVENOUS

## 2015-11-25 MED ORDER — CEPHALEXIN 500 MG PO CAPS: 500.0000 mg | ORAL_CAPSULE | Freq: Four times a day (QID) | ORAL | Status: DC

## 2015-11-25 NOTE — ED Notes (Signed)
Pt notified by tech RN would be in to review her discharge papers and prescription. Pt not found in room. Made attempt to locate pt. Unsuccessful. Pt eloped prior to discharge.

## 2015-11-25 NOTE — ED Notes (Signed)
Patient here from wound care center with complaints of hyperglycemia.

## 2015-11-25 NOTE — Discharge Instructions (Signed)
Please do not forget to take her insulin. Return for worsening symptoms, including fevers,  Confusion, Vomiting unable to keep down fluids, severe abdominal pain, or any other symptoms concerning to you. You also have a UTI. Please take antibiotics as prescribed  Hyperglycemia High blood sugar (hyperglycemia) means that the level of sugar in your blood is higher than it should be. Signs of high blood sugar include:  Feeling thirsty.  Frequent peeing (urinating).  Feeling tired or sleepy.  Dry mouth.  Vision changes.  Feeling weak.  Feeling hungry but losing weight.  Numbness and tingling in your hands or feet.  Headache. When you ignore these signs, your blood sugar may keep going up. These problems may get worse, and other problems may begin. HOME CARE  Check your blood sugars as told by your doctor. Write down the numbers with the date and time.  Take the right amount of insulin or diabetes pills at the right time. Write down the dose with date and time.  Refill your insulin or diabetes pills before running out.  Watch what you eat. Follow your meal plan.  Drink liquids without sugar, such as water. Check with your doctor if you have kidney or heart disease.  Follow your doctor's orders for exercise. Exercise at the same time of day.  Keep your doctor's appointments. GET HELP RIGHT AWAY IF:   You have trouble thinking or are confused.  You have fast breathing with fruity smelling breath.  You pass out (faint).  You have 2 to 3 days of high blood sugars and you do not know why.  You have chest pain.  You are feeling sick to your stomach (nauseous) or throwing up (vomiting).  You have sudden vision changes. MAKE SURE YOU:   Understand these instructions.  Will watch your condition.  Will get help right away if you are not doing well or get worse.   This information is not intended to replace advice given to you by your health care provider. Make sure you  discuss any questions you have with your health care provider.   Document Released: 10/09/2009 Document Revised: 01/02/2015 Document Reviewed: 08/18/2015 Elsevier Interactive Patient Education [employer].

## 2015-11-25 NOTE — ED Notes (Signed)
Bed: WA08 Expected date:  Expected time:  Means of arrival:  Comments: Hyperglycemia-from wound care center

## 2015-11-25 NOTE — ED Provider Notes (Signed)
CSN: [id number]     Arrival date & time 11/25/15  1104 History   First MD Initiated Contact with Patient 11/25/15 1132     No chief complaint on file.    (Consider location/radiation/quality/duration/timing/severity/associated sxs/prior Treatment) HPI  in short, this is a 49 year old female who presents with hyperglycemia. She has history of hypertension, hyperlipidemia, type 2 diabetes complicated by neuropathy and gastroparesis, and COPD. States that she presented for routine appointment at the wound clinic today, and was sent to the emergency department for hyperglycemia. Point of care glucose there was noted to be in the 500s. She states that they did examine her wounds, and stated that they looked well without concern for infection. States that she did take her insulin this morning, but may have forgotten a few doses over the course of the past few days. Has not had any nausea, vomiting, abdominal pain, fevers or chills, chest pain or difficulty breathing, cough, or other recent infections. Past Medical History  Diagnosis Date  . Hypertension   . Hyperlipemia   . GERD (gastroesophageal reflux disease)   . Insomnia   . SUI (stress urinary incontinence, female) S/P SLING 12-29-2011  . SOB (shortness of breath) on exertion   . Bipolar disorder ([place])   . Neurogenic bladder   . Hypercholesterolemia   . Gastroparesis   . Ulcer   . MRSA (methicillin resistant Staphylococcus aureus)   . Chronic pain   . Edema   . Chest pain     a. 2008 Cath: normal cors;  b. 12/2013 [facility] CL: EF 47%, no ischemia/infarct.  . Anxiety   . Vertigo   . COPD (chronic obstructive pulmonary disease) ([place])   . Cholecystitis   . Hypersomnia   . Tobacco abuse   . Claudication ([place])     a. 12/2013 ABI's: R 0.97, L 0.94.  [name] Palpitations   . Anginal pain ([place])   . Pneumonia     "twice, I think" (02/12/2014)  . Sleep apnea     didn't tolerate cpap (02/12/2014)  . Type II diabetes mellitus ([place])   . History of  blood transfusion 1986    "related to lost a child" (02/12/2014)  . H/O hiatal hernia   . [id number])     "weekly" (02/12/2014)  . Stroke ([place]) 02/12/2014    "eyesight is messed up; not steady on my feet" (02/12/2014)  . Arthritis     "qwhere" (02/12/2014)  . DJD (degenerative joint disease)   . Chronic back pain   . Depression   . Personality disorder   . OSA (obstructive sleep apnea) 07/04/2014   Past Surgical History  Procedure Laterality Date  . Knee arthroscopy w/ allograft impant Left     graft x 2  . Foot surgery Bilateral   . Anterior cervical decomp/discectomy fusion  2000    C6 - 7  . Pubovaginal sling  12/29/2011    Procedure: Gaynelle Arabian;  Surgeon: Reece Packer, MD;  Location: [facility];  Service: Urology;  Laterality: N/A;  cysto and sparc sling   . Lumbar fusion      "cage in my spine"  . Total abdominal hysterectomy w/ bilateral salpingoophorectomy  1997  . Multiple laparoscopies for endometriosis    . Cesarean section  1989; 1992  . Repeat reconstruction acl left knee/ screws removed  03-28-2000    CADAVER GRAFT  . 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 Knee surgery      TOTAL 8 SURG'S  .  Cystoscopy with injection  05/04/2012    Procedure: CYSTOSCOPY WITH INJECTION;  Surgeon: Reece Packer, MD;  Location: [facility];  Service: Urology;  Laterality: N/A;  MACROPLASTIQUE INJECTION  . Cystoscopy with injection  08/28/2012    Procedure: CYSTOSCOPY WITH INJECTION;  Surgeon: Reece Packer, MD;  Location: [facility];  Service: Urology;  Laterality: N/A;  cysto and macroplastique   . Carpal tunnel release Right 04-25-2013  . Cysto N/A 04/30/2013    Procedure: CYSTOSCOPY;  Surgeon: Reece Packer, MD;  Location: [facility];  Service: Urology;  Laterality: N/A;  . Pubovaginal sling N/A 04/30/2013    Procedure: REMOVAL OF VAGINAL MESH;  Surgeon: Reece Packer, MD;  Location: [facility];  Service:  Urology;  Laterality: N/A;  . Abdominal hysterectomy    . Hernia repair  ?1996    "stomach"  . Cardiac catheterization  05-22-2008   DR SKAINS    NO SIGNIFECANT CAD/ NORMAL LV/  EF 65%/  NO WALL MOTION ABNORMALITIES  . Cardiac catheterization  02/12/2014  . Left heart catheterization with coronary angiogram N/A 02/12/2014    Procedure: LEFT HEART CATHETERIZATION WITH CORONARY ANGIOGRAM;  Surgeon: Candee Furbish, MD;  Location: [facility] CATH LAB;  Service: Cardiovascular;  Laterality: N/A;  . Laparoscopic sigmoid colectomy  04/22/2015  . Laparoscopic sigmoid colectomy N/A 04/22/2015    Procedure: LAPAROSCOPIC HAND ASSISTED SIGMOID COLECTOMY;  Surgeon: Erroll Luna, MD;  Location: [facility];  Service: General;  Laterality: N/A;   Family History  Problem Relation Age of Onset  . Hypertension Mother   . Diabetes Mother   . Cancer - Other Mother     lymphoma   . Cancer - Other Father     lung, bladder cancer   . Heart attack Father   . Cancer - Other Brother     bladder cancer    Social History  Substance Use Topics  . Smoking status: Current Every Day Smoker -- 1.00 packs/day for 41 years    Types: Cigarettes, E-cigarettes  . Smokeless tobacco: Never Used  . Alcohol Use: 0.0 oz/week     Comment: 02/12/2014 "might have a mixed drink on special occasions"   OB History    No data available     Review of Systems Physical Exam  Nursing note and vitals reviewed. Constitutional: Well developed, well nourished, non-toxic, and in no acute distress Head: Normocephalic and atraumatic.  Mouth/Throat: Oropharynx is clear and moist.  Neck: Normal range of motion. Neck supple.  Cardiovascular: Normal rate and regular rhythm.   Pulmonary/Chest: Effort normal and breath sounds normal.  Abdominal: Soft. There is no tenderness. There is no rebound and no guarding.  Musculoskeletal: Normal range of motion.  Neurological: Alert, no facial droop, fluent speech, moves all extremities symmetrically Skin: Skin  is warm and dry.  Psychiatric: Cooperative   Allergies  Latex; Ibuprofen; Sweetening enhancer; Tetracyclines & related; Trazodone and nefazodone; Triazolam; and Aspartame and phenylalanine  Home Medications   Prior to Admission medications   Medication Sig Start Date End Date Taking? Authorizing Provider  amphetamine-dextroamphetamine (ADDERALL) 30 MG tablet Take 30 mg by mouth 2 (two) times daily.  09/01/15  Yes Historical Provider, MD  atorvastatin (LIPITOR) 40 MG tablet Take 1 tablet (40 mg total) by mouth daily at 6 PM. 11/17/15  Yes Isaiah Serge, NP  canagliflozin [facility]) 300 MG TABS tablet Take 300 mg by mouth daily before breakfast. 08/13/15  Yes Elayne Snare, MD  CHANTIX 0.5 MG tablet  Take 1 mg by mouth daily.  08/03/15  Yes Historical Provider, MD  chlorproMAZINE (THORAZINE) 200 MG tablet Take 200 mg by mouth at bedtime.   Yes Historical Provider, MD  cyclobenzaprine (FLEXERIL) 10 MG tablet Take 10 mg by mouth at bedtime as needed for muscle spasms.  11/03/15  Yes Historical Provider, MD  diazepam (VALIUM) 5 MG tablet Take 5-10 mg by mouth at bedtime as needed for anxiety or muscle spasms.  11/05/15  Yes Historical Provider, MD  esomeprazole (NEXIUM) 40 MG capsule Take 40 mg by mouth daily.    Yes Historical Provider, MD  fenofibrate (TRICOR) 145 MG tablet TAKE 1 TABLET (145 MG TOTAL) BY MOUTH DAILY. 11/24/15  Yes Elayne Snare, MD  LANTUS SOLOSTAR 100 UNIT/ML Solostar Pen INJECT 42 UNITS DAILY AT THE SAME TIME EVERY DAY. IF SUGAR GOES ABOVE 200 INCREASE DOSE TO 50 Patient taking differently: INJECT 50 UNITS DAILY AT THE SAME TIME EVERY DAY. IF SUGAR GOES ABOVE 200 INCREASE DOSE TO 50 05/04/15  Yes Elayne Snare, MD  oxyCODONE (OXY IR/ROXICODONE) 5 MG immediate release tablet Take 5 mg by mouth 2 (two) times daily as needed for moderate pain or severe pain.  11/23/15  Yes Historical Provider, MD  oxyCODONE-acetaminophen (PERCOCET) 10-325 MG per tablet Take 1 tablet by mouth 3 (three) times daily  as needed for pain.   Yes Historical Provider, MD  phentermine (ADIPEX-P) 37.5 MG tablet Take 37.5 mg by mouth daily as needed (appetite control).  09/23/15  Yes Historical Provider, MD  potassium chloride SA (K-DUR,KLOR-CON) 20 MEQ tablet Take 1 tablet (20 mEq total) by mouth daily. 11/12/15  Yes Elayne Snare, MD  rizatriptan (MAXALT) 10 MG tablet Take 1 tablet (10 mg total) by mouth as needed for migraine. May repeat in 2 hours if needed 06/11/14  Yes Garvin Fila, MD  spironolactone (ALDACTONE) 25 MG tablet TAKE 2 TABLETS (50 MG TOTAL) BY MOUTH 2 (TWO) TIMES DAILY. 07/10/15  Yes Elayne Snare, MD  tamsulosin (FLOMAX) 0.4 MG CAPS capsule Take 0.4 mg by mouth daily. 11/21/15  Yes Historical Provider, MD  VICTOZA 18 MG/3ML SOPN Inject 0.2 mLs (1.2 mg total) into the skin daily. Inject once daily at the same time 11/12/15  Yes Elayne Snare, MD  Vortioxetine HBr (TRINTELLIX) 20 MG TABS Take 20 mg by mouth daily.    Yes Historical Provider, MD  Vortioxetine HBr 5 MG TABS Take 5 mg by mouth daily.   Yes Historical Provider, MD  cephALEXin (KEFLEX) 500 MG capsule Take 1 capsule (500 mg total) by mouth 4 (four) times daily. 11/25/15   Forde Dandy, MD   BP 126/80 mmHg  Pulse 84  Temp(Src) 97.6 F (36.4 C) (Oral)  Resp 22  SpO2 100% Physical Exam  ED Course  Procedures (including critical care time) Labs Review Labs Reviewed  CBC WITH DIFFERENTIAL/PLATELET - Abnormal; Notable for the following:    WBC 11.0 (*)    All other components within normal limits  COMPREHENSIVE METABOLIC PANEL - Abnormal; Notable for the following:    Sodium 130 (*)    Chloride 96 (*)    Glucose, Bld 442 (*)    Creatinine, Ser 1.07 (*)    GFR calc non Af Amer 59 (*)    All other components within normal limits  URINALYSIS, ROUTINE W REFLEX MICROSCOPIC (NOT AT [facility]) - Abnormal; Notable for the following:    APPearance TURBID (*)    Glucose, UA >1000 (*)    Hgb urine dipstick  SMALL (*)    Nitrite POSITIVE (*)     Leukocytes, UA LARGE (*)    All other components within normal limits  URINE MICROSCOPIC-ADD ON - Abnormal; Notable for the following:    Squamous Epithelial / LPF 0-5 (*)    Bacteria, UA MANY (*)    All other components within normal limits  CBG MONITORING, ED - Abnormal; Notable for the following:    Glucose-Capillary 502 (*)    All other components within normal limits  CBG MONITORING, ED - Abnormal; Notable for the following:    Glucose-Capillary 260 (*)    All other components within normal limits  URINE CULTURE    Imaging Review No results found.   I have personally reviewed and evaluated these images and lab results as part of my medical decision-making.   MDM   Final diagnoses:  Hyperglycemia  Type 2 diabetes mellitus with complication, with long-term current use of insulin (HCC)  Acute cystitis without hematuria     in short, this is a 49 year old female with history of type 2 diabetes who presents with hyperglycemia in the setting of medication noncompliance. Is well-appearing and in no acute distress. Vital signs are non-concerning. Exam is unremarkable. She does have hyperglycemia and elevated glucose in her urine, but no evidence of urinary ketones, anion gap, or acidosis that would be concerning for DKA. Point-of-care glucose initially 500, and she received 2 L of IV fluids. Glucose subsequently noted to be at 260. She also has evidence of a urinary tract infection, positive nitrites, positive leukocytes, many bacteria, and numerous WBCs. Urine is sent for culture, and she is prescribed a course of Keflex for treatment. No systemic signs of illness, no abdominal pain or flank pain.  Patient is felt appropriate for discharge home. Discussed importance of medication compliance. Strict return and follow-up instructions are reviewed. She expressed understanding of all discharge instructions and felt comfortable to plan of care.    Forde Dandy, MD 11/25/15 [phone]

## 2015-11-29 LAB — URINE CULTURE: Culture: >=100000

## 2015-11-30 ENCOUNTER — Telehealth (HOSPITAL_COMMUNITY): Payer: Self-pay

## 2015-11-30 NOTE — Telephone Encounter (Signed)
Post ED Visit - Positive Culture Follow-up  Culture report reviewed by antimicrobial stewardship pharmacist:  []  Elenor Quinones, Pharm.D. [x]  Heide Guile, Pharm.D., BCPS []  Parks Neptune, Pharm.D. []  Alycia Rossetti, Pharm.D., BCPS []  [place], [place].D., BCPS, AAHIVP []  Legrand Como, Pharm.D., BCPS, AAHIVP []  Milus Glazier, Pharm.D. []  Stephens November, Pharm.D.  Positive urine culture, >/= 100,000 colonies -> Klebsiella Pneumoniae Treated with Cephalexin, organism sensitive to the same and no further patient follow-up is required at this time.  Dortha Kern 11/30/2015, 8:55 AM

## 2015-12-01 ENCOUNTER — Other Ambulatory Visit (HOSPITAL_BASED_OUTPATIENT_CLINIC_OR_DEPARTMENT_OTHER): Payer: Medicare Other

## 2015-12-01 ENCOUNTER — Encounter: Payer: Self-pay | Admitting: Hematology

## 2015-12-01 ENCOUNTER — Telehealth: Payer: Self-pay | Admitting: Hematology

## 2015-12-01 ENCOUNTER — Ambulatory Visit (HOSPITAL_BASED_OUTPATIENT_CLINIC_OR_DEPARTMENT_OTHER): Payer: Medicare Other | Admitting: Hematology

## 2015-12-01 ENCOUNTER — Other Ambulatory Visit: Payer: Self-pay | Admitting: *Deleted

## 2015-12-01 VITALS — BP 169/77 | HR 86 | Temp 97.9°F | Resp 18 | Ht 62.0 in | Wt 218.5 lb

## 2015-12-01 DIAGNOSIS — Z720 Tobacco use: Secondary | ICD-10-CM

## 2015-12-01 DIAGNOSIS — C187 Malignant neoplasm of sigmoid colon: Secondary | ICD-10-CM

## 2015-12-01 DIAGNOSIS — F329 Major depressive disorder, single episode, unspecified: Secondary | ICD-10-CM

## 2015-12-01 DIAGNOSIS — C189 Malignant neoplasm of colon, unspecified: Secondary | ICD-10-CM

## 2015-12-01 DIAGNOSIS — E1165 Type 2 diabetes mellitus with hyperglycemia: Secondary | ICD-10-CM

## 2015-12-01 LAB — CBC WITH DIFFERENTIAL/PLATELET
BASO%: 0.9 % (ref 0.0–2.0)
Basophils Absolute: 0.1 10*3/uL (ref 0.0–0.1)
EOS%: 0.1 % (ref 0.0–7.0)
Eosinophils Absolute: 0 10*3/uL (ref 0.0–0.5)
HCT: 36.4 % (ref 34.8–46.6)
HGB: 12.1 g/dL (ref 11.6–15.9)
LYMPH#: 2.1 10*3/uL (ref 0.9–3.3)
LYMPH%: 24.4 % (ref 14.0–49.7)
MCH: 29.4 pg (ref 25.1–34.0)
MCHC: 33.2 g/dL (ref 31.5–36.0)
MCV: 88.5 fL (ref 79.5–101.0)
MONO#: 0.3 10*3/uL (ref 0.1–0.9)
MONO%: 4 % (ref 0.0–14.0)
NEUT%: 70.6 % (ref 38.4–76.8)
NEUTROS ABS: 6 10*3/uL (ref 1.5–6.5)
PLATELETS: 248 10*3/uL (ref 145–400)
RBC: 4.12 10*6/uL (ref 3.70–5.45)
RDW: 14.9 % — ABNORMAL HIGH (ref 11.2–14.5)
WBC: 8.6 10*3/uL (ref 3.9–10.3)

## 2015-12-01 LAB — COMPREHENSIVE METABOLIC PANEL
ALK PHOS: 72 U/L (ref 40–150)
ALT: 16 U/L (ref 0–55)
ANION GAP: 10 meq/L (ref 3–11)
AST: 12 U/L (ref 5–34)
Albumin: 3.4 g/dL — ABNORMAL LOW (ref 3.5–5.0)
BUN: 9.1 mg/dL (ref 7.0–26.0)
CO2: 24 meq/L (ref 22–29)
Calcium: 9.1 mg/dL (ref 8.4–10.4)
Chloride: 105 mEq/L (ref 98–109)
Creatinine: 1.1 mg/dL (ref 0.6–1.1)
EGFR: 61 mL/min/{1.73_m2} — AB (ref >90)
Glucose: 293 mg/dl — ABNORMAL HIGH (ref 70–140)
Potassium: 3.4 mEq/L — ABNORMAL LOW (ref 3.5–5.1)
Sodium: 139 mEq/L (ref 136–145)
TOTAL PROTEIN: 6.3 g/dL — AB (ref 6.4–8.3)

## 2015-12-01 NOTE — Progress Notes (Signed)
[place]  [phone] [phone]) [phone]  Clinic follow up Note   Patient Care Team: Tamsen Roers, MD as PCP - General (Family Medicine) Elayne Snare, MD as Consulting Physician (Endocrinology) Erroll Luna, MD as Consulting Physician (General Surgery) Truitt Merle, MD as Consulting Physician (Hematology) 12/01/2015  CHIEF COMPLAINTS:  Follow up stage I colon cancer   Oncology History   Colon cancer   Staging form: Colon and Rectum, AJCC 7th Edition     Pathologic stage from 05/16/2015: Stage I (T1, N0, cM0) - Signed by Truitt Merle, MD on 06/08/2015       Cancer of sigmoid colon ([place])   02/26/2015 Initial Diagnosis Sigmoid Colon cancer   02/26/2015 Procedure Colonoscopy showed internal hemorrhoids, 2 polyps in the transverse and ileocecal valve. There is a ulcerated polypoid lesion in the sigmoid colon which was biopsied.   03/02/2015 Tumor Marker CEA= 2.5   03/06/2015 Imaging CT chest, abdomen and pelvis with contrast showed no evidence of metastatic disease.   04/22/2015 Surgery Sigmoid colon segmental resection, showed grade 2 adenocarcinoma, T1, 1.3 cm, 10 lymph nodes were negative, no lymphovascular invasion, no perineural invasion.     HISTORY OF PRESENTING ILLNESS:  [name] 49 y.o. female with the multiple medical comorbidities, including diabetes, hypertension, depression and stroke in 2015, is here because of recently diagnosed colon cancer.  She has chronic constipation, no nausea, abdominal pain or diarrhea. She denies melena or hematochezia. She was referred to see GI Dr. Paulita Fujita, and underwent a colonoscopy on 02/26/2015. The colonoscopy showed internal hemorrhoids, 2 polyps in the transverse and ileocecal valve. A ulcerated polypoid lesion in the sigmoid colon was seen and biopsied, which showed adenocarcinoma. She was referred to  surgeon Dr. Brantley Stage, and underwent laparoscopic sigmoid colon segmental resection on 04/22/2015.  She has recovered  well from surgery. She was discharged home after surgery. She had ED visit on 05/04/2015 for wound dehiscence after staple removal. She has been changing the dressing every other day at home. She has appointment to see Dr. Brantley Stage this abdomen.  She denies any significant pain, BM is normal. No other new complains. She has diabetic neuropathy in hands and feet, which is stable. She lives alone, does not have much social support. She appears to be depressed with a very flat facial expression, but she denies suicidal ideas.  CURRENT THERAPY: Observation  INTERIM HISTORY  [name] returns for follow-up. She complains of facial swelling for the past 3-4 weeks, was seen by her PCP Dr. Rex Kras, and tried a short course of steroids with transient effect. She states "no body seems to know what my face is welling and no body seems to care about it". Her facial expression is very flat, which is usual for her. She denies any abdominal pain, bloating, diarrhea or nausea. She denies any pain, or other new symptoms. She otherwise is doing well.    MEDICAL HISTORY:  Past Medical History  Diagnosis Date  . Hypertension   . Hyperlipemia   . GERD (gastroesophageal reflux disease)   . Insomnia   . SUI (stress urinary incontinence, female) S/P SLING 12-29-2011  . SOB (shortness of breath) on exertion   . Bipolar disorder ([place])   . Neurogenic bladder   . Hypercholesterolemia   . Gastroparesis   . Ulcer   . MRSA (methicillin resistant Staphylococcus aureus)   . Chronic pain   . Edema   . Chest pain     a. 2008 Cath: normal cors;  b. 12/2013 Lexi CL: EF 47%, no ischemia/infarct.  . Anxiety   . Vertigo   . COPD (chronic obstructive pulmonary disease) ([place])   . Cholecystitis   . Hypersomnia   . Tobacco abuse   . Claudication ([place])     a. 12/2013 ABI's: R 0.97, L 0.94.  [name] Palpitations   . Anginal pain ([place])   . Pneumonia     "twice, I think" (02/12/2014)  . Sleep apnea     didn't tolerate cpap (02/12/2014)    . Type II diabetes mellitus ([place])   . History of blood transfusion 1986    "related to lost a child" (02/12/2014)  . H/O hiatal hernia   . WHQPRFFM(384.6)     "weekly" (02/12/2014)  . Stroke ([place]) 02/12/2014    "eyesight is messed up; not steady on my feet" (02/12/2014)  . Arthritis     "qwhere" (02/12/2014)  . DJD (degenerative joint disease)   . Chronic back pain   . Depression   . Personality disorder   . OSA (obstructive sleep apnea) 07/04/2014    SURGICAL HISTORY: Past Surgical History  Procedure Laterality Date  . Knee arthroscopy w/ allograft impant Left     graft x 2  . Foot surgery Bilateral   . Anterior cervical decomp/discectomy fusion  2000    C6 - 7  . Pubovaginal sling  12/29/2011    Procedure: Gaynelle Arabian;  Surgeon: Reece Packer, MD;  Location: [facility];  Service: Urology;  Laterality: N/A;  cysto and sparc sling   . Lumbar fusion      "cage in my spine"  . Total abdominal hysterectomy w/ bilateral salpingoophorectomy  1997  . Multiple laparoscopies for endometriosis    . Cesarean section  1989; 1992  . Repeat reconstruction acl left knee/ screws removed  03-28-2000    CADAVER GRAFT  . 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 Knee surgery      TOTAL 8 SURG'S  . Cystoscopy with injection  05/04/2012    Procedure: CYSTOSCOPY WITH INJECTION;  Surgeon: Reece Packer, MD;  Location: [facility];  Service: Urology;  Laterality: N/A;  MACROPLASTIQUE INJECTION  . Cystoscopy with injection  08/28/2012    Procedure: CYSTOSCOPY WITH INJECTION;  Surgeon: Reece Packer, MD;  Location: [facility];  Service: Urology;  Laterality: N/A;  cysto and macroplastique   . Carpal tunnel release Right 04-25-2013  . Cysto N/A 04/30/2013    Procedure: CYSTOSCOPY;  Surgeon: Reece Packer, MD;  Location: [facility];  Service: Urology;  Laterality: N/A;  . Pubovaginal sling N/A 04/30/2013    Procedure: REMOVAL OF  VAGINAL MESH;  Surgeon: Reece Packer, MD;  Location: [facility];  Service: Urology;  Laterality: N/A;  . Abdominal hysterectomy    . Hernia repair  ?1996    "stomach"  . Cardiac catheterization  05-22-2008   DR SKAINS    NO SIGNIFECANT CAD/ NORMAL LV/  EF 65%/  NO WALL MOTION ABNORMALITIES  . Cardiac catheterization  02/12/2014  . Left heart catheterization with coronary angiogram N/A 02/12/2014    Procedure: LEFT HEART CATHETERIZATION WITH CORONARY ANGIOGRAM;  Surgeon: Candee Furbish, MD;  Location[facility] C[facility];  Service: Cardiovascular;  Laterality: N/A;  . Laparoscopic sigmoid colectomy  04/22/2015  . Laparoscopic sigmoid colectomy N/A 04/22/2015    Procedure: LAPAROSCOPIC HAND ASSISTED SIGMOID COLECTOMY;  Surgeon: Erroll Luna, MD;  Location: [place];  Service: General;  Laterality: N/A;  SOCIAL HISTORY: Social History   Social History  . Marital Status: Divorced    Spouse Name: N/A  . Number of Children: 2  . Years of Education: 6th   Occupational History  . Not on file.   Social History Main Topics  . Smoking status: Current Every Day Smoker -- 1.00 packs/day for 41 years    Types: Cigarettes, E-cigarettes  . Smokeless tobacco: Never Used  . Alcohol Use: 0.0 oz/week     Comment: 02/12/2014 "might have a mixed drink on special occasions"  . Drug Use: No  . Sexual Activity: Yes   Other Topics Concern  . Not on file   Social History Narrative  Patient is divorced and lives alone- independent in ADLs, Drives   Patient has two adult children- grown son and daughter who is special needs in a home   Patient is disabled since 58   Patient has a 6th grade education.   Patient is right-handed.   Depression-medication and therapist   Patient drinks 2- 3 liters of soda and drinks tea but not everyday.    Does not routinely exercise.    FAMILY HISTORY: Family History  Problem Relation Age of Onset  . Hypertension Mother   . Diabetes Mother   . Cancer - Other Mother     lymphoma   . Cancer - Other Father     lung, bladder cancer   . Heart attack Father   . Cancer - Other Brother     bladder cancer     ALLERGIES:  is allergic to latex; ibuprofen; sweetening enhancer; tetracyclines & related; trazodone and nefazodone; triazolam; and aspartame and phenylalanine.  MEDICATIONS:  Current Outpatient Prescriptions  Medication Sig Dispense Refill  . amphetamine-dextroamphetamine (ADDERALL) 30 MG tablet Take 30 mg by mouth 2 (two) times daily.   0  . atorvastatin (LIPITOR) 40 MG tablet Take 1 tablet (40 mg total) by mouth daily at 6 PM. 90 tablet 3  . canagliflozin (INVOKANA) 300 MG TABS tablet Take 300 mg by mouth daily before breakfast. 30 tablet 3  . CHANTIX 0.5 MG tablet Take 1 mg by mouth daily.     . chlorproMAZINE (THORAZINE) 200 MG tablet Take 200 mg by mouth at bedtime.    . cyclobenzaprine (FLEXERIL) 10 MG tablet Take 10 mg by mouth at bedtime as needed for muscle spasms.   0  . diazepam (VALIUM) 5 MG tablet Take 5-10 mg by mouth at bedtime as needed for anxiety or muscle spasms.   1  . esomeprazole (NEXIUM) 40 MG capsule Take 40 mg by mouth daily.     . fenofibrate (TRICOR) 145 MG tablet TAKE 1 TABLET (145 MG TOTAL) BY MOUTH DAILY. 30 tablet 5  . oxyCODONE (OXY IR/ROXICODONE) 5 MG  immediate release tablet Take 5 mg by mouth 2 (two) times daily as needed for moderate pain or severe pain.     [name] oxyCODONE-acetaminophen (PERCOCET) 10-325 MG per tablet Take 1 tablet by mouth 3 (three) times daily as needed for pain.    . phentermine (ADIPEX-P) 37.5 MG tablet Take 37.5 mg by mouth daily as needed (appetite control).   0  . potassium chloride SA (K-DUR,KLOR-CON) 20 MEQ tablet Take 1 tablet (20 mEq total) by mouth daily. 30 tablet 0  . rizatriptan (MAXALT) 10 MG tablet Take 1 tablet (10 mg total) by mouth as needed for migraine. May repeat in 2 hours if needed 10 tablet 3  . spironolactone (ALDACTONE) 25 MG tablet TAKE 2 TABLETS (50 MG TOTAL) BY MOUTH 2 (TWO) TIMES DAILY. 60 tablet 2  . tamsulosin (FLOMAX) 0.4 MG CAPS capsule Take 0.4 mg by mouth daily.    [name] VICTOZA 18 MG/3ML SOPN Inject 0.2 mLs (1.2 mg total) into the skin daily. Inject once daily at the same time 2 pen 3  . Vortioxetine HBr (TRINTELLIX) 20 MG TABS Take 20 mg by mouth daily.     . Vortioxetine HBr 5 MG TABS Take 5 mg by mouth daily.    [name] LANTUS SOLOSTAR 100 UNIT/ML Solostar Pen INJECT 46 UNITS DAILY AT THE SAME TIME EVERY DAY. IF SUGAR GOES ABOVE 200 INCREASE DOSE TO 50 (Patient taking differently: INJECT  52 UNITS DAILY AT THE SAME TIME EVERY DAY. IF SUGAR GOES ABOVE 200 INCREASE DOSE TO 50) 45 mL 2   No current facility-administered medications for this visit.    REVIEW OF SYSTEMS:   Constitutional: Denies fevers, chills or abnormal night sweats Eyes: Denies blurriness of vision, double vision or watery eyes Ears, nose, mouth, throat, and face: Denies mucositis or sore throat Respiratory: Denies cough, dyspnea or wheezes Cardiovascular: Denies palpitation, chest discomfort or lower extremity swelling Gastrointestinal:  Denies nausea, heartburn or change in bowel habits Skin: Denies abnormal skin rashes Lymphatics: Denies new lymphadenopathy or easy bruising Neurological:Denies numbness, tingling or new  weaknesses Behavioral/Psych: Mood is stable, no new changes  All other systems were reviewed with the patient and are negative.  PHYSICAL EXAMINATION: ECOG PERFORMANCE STATUS: 1 - Symptomatic but completely ambulatory  Filed Vitals:   12/01/15 1539  BP: 169/77  Pulse: 86  Temp: 97.9 F (36.6 C)  Resp: 18   Filed Weights   12/01/15 1539  Weight: 218 lb 8 oz (99.111 kg)    GENERAL:alert, no distress and comfortable SKIN: skin color, texture, turgor are normal, no rashes or significant lesions EYES: normal, conjunctiva are pink and non-injected, sclera clear OROPHARYNX:no exudate, no erythema and lips, buccal mucosa, and tongue normal  NECK: supple, thyroid normal size, non-tender, without nodularity LYMPH:  no palpable lymphadenopathy in the cervical, axillary or inguinal LUNGS: clear to auscultation and percussion with normal breathing effort HEART: regular rate & rhythm and no murmurs and no lower extremity edema ABDOMEN:abdomen soft, no abdomen midline surgical wound is covered by gauze. Non-tender and normal bowel sounds Musculoskeletal:no cyanosis of digits and no clubbing  PSYCH: alert & oriented x 3 with fluent speech NEURO: no focal motor/sensory deficits  LABORATORY DATA:  I have reviewed the data as listed CBC Latest Ref Rng 12/01/2015 11/25/2015 09/08/2015  WBC 3.9 - 10.3 10e3/uL 8.6 11.0(H) 10.4(H)  Hemoglobin 11.6 - 15.9 g/dL 12.1 12.4 12.9  Hematocrit 34.8 - 46.6 % 36.4 36.6 38.3  Platelets 145 - 400 10e3/uL 248 248 223    CMP Latest Ref Rng 12/01/2015 11/25/2015 11/09/2015  Glucose 70 - 140 mg/dl 293(H) 442(H) 377(H)  BUN 7.0 - 26.0 mg/dL 9._0 Creatinine 0.6 - 1.1 mg/dL 1.1 1.07(H) 0.99  Sodium 136 - 145 mEq/L 139 130(L) 135  Potassium 3.5 - 5.1 mEq/L 3.4(L) 3.5 3.1(L)  Chloride 101 - 111 mmol/L - 96(L) 98  CO2 22 - 29 mEq/L _1 Calcium 8.4 - 10.4 mg/dL 9.1 9.1 9.3  Total Protein 6.4 - 8.3 g/dL 6.3(L) 6.7 6.4  Total Bilirubin 0.20 - 1.20 mg/dL  <0.30 0.5 0.3  Alkaline Phos 40 - 150 U/L 72 66 84  AST 5 - 34 U/L _2 ALT 0 - 55 U/L _3 CEA  Status: Finalresult Visible to patient:  Not Released Nextappt: Today at 03:00 PM in [facility] 3) Dx:  Colon cancer ([place])         Ref Range 44moago    CEA 0.0 - 5.0 ng/mL 2.1         CEA is pending   PATHOLOGY REPORT Diagnosis 04/22/2015 1. Colon, segmental resection for tumor, Sigmoid - COLONIC ADENOCARCINOMA EXTENDING INTO SUBMUCOSA, 1.3 CM - MARGINS NOT INVOLVED. - NINE BENIGN LYMPH NODES (0/9). 2. Colon, segmental resection, Sigmoid - FOCAL MESENTERIC FAT NECROSIS WITH HEMORRHAGE AND FIBROSIS. - NO EVIDENCE OF MALIGNANCY. - ONE BENIGN LYMPH NODE (  0/1). 3. Colon, resection margin (donut), Proximal donut - BENIGN COLON TISSUE. - NO EVIDENCE OF MALIGNANCY. 4. Colon, resection margin (donut), Distal donut - BENIGN COLON TISSUE. - NO EVIDENCE OF MALIGNANCY.  Microscopic Comment 1. COLON AND RECTUM (INCLUDING TRANS-ANAL RESECTION): Specimen: Sigmoid colon with proximal and distal donut margins. Procedure: Segmental resection with proximal and distal donut margins. Tumor site: Sigmoid Specimen integrity: Previously opened Macroscopic intactness of mesorectum: Incomplete Macroscopic tumor perforation: No Invasive tumor: Maximum size: 1.3 cm Histologic type(s): Colonic adenocarcinoma Histologic grade and differentiation: G2: moderately differentiated/low grade Type of polyp in which invasive carcinoma arose: No residual polyp Microscopic extension of invasive tumor: Into submucosa Lymph-Vascular invasion: N Peri-neural invasion: No Tumor deposit(s) (discontinuous extramural extension): No Resection margins: Proximal margin: Free of tumor Distal margin: Free of tumor Circumferential (radial) (posterior ascending, posterior descending; lateral and posterior mid-rectum; and entire lower 1/3 rectum): N/A Mesenteric margin  (sigmoid and transverse): Free of tumor Distance closest margin (if all above margins negative): N/A Treatment effect (neo-adjuvant therapy): No Additional polyp(s): No Non-neoplastic findings: Focal mesenteric adipose tissue fat necrosis with hemorrhage and fibrosis. Lymph nodes: number examined 10; number positive: 0 Pathologic Staging: pT1, pN0, pMX Ancillary studies: Pending. (JDP:kh 4 -28-16)  RADIOGRAPHIC STUDIES: I have personally reviewed the radiological images as listed and agreed with the findings in the report.  CT chest, abdomen and pelvis w contrast on 03/06/2015  IMPRESSION: 1. No findings to suggest metastatic disease in the chest, abdomen or pelvis. 2. Diffuse thickening of the colonic wall in the region of the sigmoid colon. Typically this is seen in the setting of acute colitis, however, there are no surrounding inflammatory changes and no definite hypervascularity in the sigmoid mesocolon on today's examination. This may represent sequela of prior episodes of recurrent sigmoid colitis. No discrete mass is identified in this region, but underlying neoplasm in this area is not excluded. 3. Normal appendix. 4. Atherosclerosis, including left anterior descending coronary artery disease. Please note that although the presence of coronary artery calcium documents the presence of coronary artery disease, the severity of this disease and any potential stenosis cannot be assessed on this non-gated CT examination. Assessment for potential risk factor modification, dietary therapy or pharmacologic therapy may be warranted, if clinically indicated. 5. Additional incidental findings, as above.  ASSESSMENT & PLAN: 49 year old Caucasian female, with multiple medical comorbidities, who was found to have a stage I sigmoid colon cancer.  1. Stage I sigmoid colon adenocarcinoma, pT1N0M0, moderately differentiated, MMR normall  -I previously reviewed her surgical pathology findings  with patient in great details. She appears to have very early stage colon cancer, no lymphovascular invasion, no perineural invasion or other high risk features. Her surgical margins were negative.  -We discussed that her colon cancer is likely cured by complete surgical resection, however she does carry a small risk of cancer recurrence after surgery. -There is no role of adjuvant chemotherapy for stage I colon cancer -Her tumor is MMR normal, she does not have significant family history of colon or endometrial cancer, unlikely Lynch syndrome. -We discussed the surveillance plan. I recommend lab tests including CEA and physical exam every 3-4 months for the first two year, then every 6-12 month for additional 3 years, colonoscopy and CT scan at one year.  -Lab reviewed, CBC and CMP normal, except hyperglycemia and mild hypokalemia. -Her CEA from today is still pending. -CT and colonoscopy in March 2017, I will send a message to GI Dr. Paulita Fujita   2. Depression  and coping -She appears to be depressed, has very flat facial expression, she states this is her normal status and she denies suicidal ideas.  3. Poorly controlled diabetes and hypertension, history of stroke, COPD -She recently had ED visit for uncontrolled hyperglycemia, BG 293 today  Her BP is also quite high today, asymptomatic  --I encourage her to follow-up with her primary care physician closely   4. Smoking cessation -We had a long discussion about smoking cessation. She is still smoking 1.5 pack a day. -She is willing to cut back and quit, I strongly encouraged her to do so. She is not interested in smoking cessation program.  Follow-up: I'll see her back in 4 months with exam and labs including CBC, CMP, CEA and CT CAP with contrast. She needs a colonoscopy in March 2017, will contact Dr. Paulita Fujita   All questions were answered. The patient knows to call the clinic with any problems, questions or concerns.  I spent 20 minutes  counseling the patient face to face. The total time spent in the appointment was 25 minutes and more than 50% was on counseling.     Truitt Merle, MD 12/01/2015 10:50 PM

## 2015-12-01 NOTE — Telephone Encounter (Signed)
Gave and printed appt sched and avs for pt for April 2017 °

## 2015-12-02 LAB — CEA: CEA: 2.1 ng/mL (ref 0.0–5.0)

## 2015-12-16 ENCOUNTER — Other Ambulatory Visit: Payer: Self-pay | Admitting: Endocrinology

## 2015-12-17 ENCOUNTER — Other Ambulatory Visit: Payer: Self-pay | Admitting: *Deleted

## 2015-12-17 ENCOUNTER — Ambulatory Visit (INDEPENDENT_AMBULATORY_CARE_PROVIDER_SITE_OTHER): Payer: Medicare Other | Admitting: Endocrinology

## 2015-12-17 ENCOUNTER — Encounter: Payer: Self-pay | Admitting: Endocrinology

## 2015-12-17 VITALS — BP 124/82 | HR 86 | Temp 98.0°F | Resp 16 | Ht 62.0 in | Wt 217.4 lb

## 2015-12-17 DIAGNOSIS — E876 Hypokalemia: Secondary | ICD-10-CM | POA: Diagnosis not present

## 2015-12-17 DIAGNOSIS — Z794 Long term (current) use of insulin: Secondary | ICD-10-CM

## 2015-12-17 DIAGNOSIS — R600 Localized edema: Secondary | ICD-10-CM | POA: Diagnosis not present

## 2015-12-17 DIAGNOSIS — E1165 Type 2 diabetes mellitus with hyperglycemia: Secondary | ICD-10-CM

## 2015-12-17 MED ORDER — SPIRONOLACTONE 100 MG PO TABS: 100.0000 mg | ORAL_TABLET | Freq: Every day | ORAL | Status: DC

## 2015-12-17 MED ORDER — GLUCOSE BLOOD VI STRP: ORAL_STRIP | Status: DC

## 2015-12-17 MED ORDER — INSULIN ASPART PROT & ASPART (70-30 MIX) 100 UNIT/ML PEN: PEN_INJECTOR | SUBCUTANEOUS | Status: DC

## 2015-12-17 MED ORDER — FREESTYLE LANCETS MISC: Status: DC

## 2015-12-17 NOTE — Patient Instructions (Addendum)
Check blood sugars on waking up 3  times a week Also check blood sugars about 2 hours after a meal and do this after different meals by rotation  Recommended blood sugar levels on waking up is 90-130 and about 2 hours after meal is 130-160  Please bring your blood sugar monitor to each visit, thank you  Change Lantus to Novolog mix 30 units in am on getting up and 20 at supper  Stop Victoza  Spironolactone 100mg  daily and stop potassium  No more mountain Dew

## 2015-12-17 NOTE — Progress Notes (Signed)
Patient ID: [name], female   DOB: [birth year], 49 y.o.   MRN: [id number]   Reason for Appointment: Diabetes follow-up    History of Present Illness:   Diagnosis: Type 2 diabetes mellitus, date of diagnosis: 2004.  PAST history: She has been treated mostly with insulin since about a year after diagnosis. She has had difficulty with consistent compliance with diet and also compliance with self care including glucose monitoring over the years. She had been mostly treated with basal insulin. Also had been tried on mealtime insulin but she would be noncompliant with this. Was tried on Prandin for mealtime control but difficult to judge efficacy because of lack of postprandial monitoring. Was given Victoza to start in 2011 but did not follow up after this.She was tried on premixed insulin but this did not help her control, mostly because of noncompliance with the doses. She had been using the V- go pump and had better control initially and was better compliant with the daily routine and boluses. She has had frequent education visits also. She stopped using her V.-go pump in 6/15 because of discomfort at the site of application   RECENT history:    INSULIN dose: Lantus 46 units qd    Her A1c previously has been consistently under 7% and low her blood sugars are much higher Does not appear to be recently  benefiting from [place] now  She is again not checking blood sugars at home and is not motivated Because of poor control and likely postprandial hyperglycemia she was given Victoza to start on the last visit and she thinks she is taking 1.2 mg daily without side effects  Current management and problems identified:  She has not checked her blood sugars and not clear what they are at home.  She claims that her blood sugar was 90 70 week ago fasting but all the lab results have been around 300  She is not drinking about 2 L of [facility] a day and probably is more thirsty because  of high sugars  She is just starting prednisone 5 mg daily from her PCP for facial swelling, previously had received a dose pack  She is not able to do any walking because of pain in her legs  Glucose today in the office without any food is over 300 but she is drinking [employer]:  FreeStyle.  Checking blood sugar very sporadically and did not bring her monitor for review Blood Glucose readings: Fasting reading over a week ago was 97   Hypoglycemia frequency: Never.    Food preferences: eating 1 or 2 meals per day, variable intake, sandwiches at times or otherwise snacks  Physical activity: exercise: Minimal.  She has difficulties with leg pain Certified Diabetes Educator visit: Most recent:, 2/14.  The diet that the patient has been following is no specific diet; still eating at irregular times; still drinking drinks with sugar claiming she is allergic to artificial sweeteners   Wt Readings from Last 3 Encounters:  12/17/15 217 lb 6.4 oz (98.612 kg)  12/01/15 218 lb 8 oz (99.111 kg)  11/17/15 214 lb 1.9 oz (97.124 kg)   DM labs:  Lab Results  Component Value Date   HGBA1C 8.3* 11/09/2015   HGBA1C 6.8 08/12/2015   HGBA1C 6.6* 04/14/2015   Lab Results  Component Value Date   MICROALBUR <0.7 08/12/2015   LDLCALC 49 08/12/2015   CREATININE 1.1 12/01/2015    No visits with results within 1  Week(s) from this visit. Latest known visit with results is:  Appointment on 12/01/2015  Component Date Value Ref Range Status  . CEA 12/01/2015 2.1  0.0 - 5.0 ng/mL Final  . WBC 12/01/2015 8.6  3.9 - 10.3 10e3/uL Final  . NEUT# 12/01/2015 6.0  1.5 - 6.5 10e3/uL Final  . HGB 12/01/2015 12.1  11.6 - 15.9 g/dL Final  . HCT 12/01/2015 36.4  34.8 - 46.6 % Final  . Platelets 12/01/2015 248  145 - 400 10e3/uL Final  . MCV 12/01/2015 88.5  79.5 - 101.0 fL Final  . MCH 12/01/2015 29.4  25.1 - 34.0 pg Final  . MCHC 12/01/2015 33.2  31.5 - 36.0 g/dL Final  . RBC 12/01/2015 4.12   3.70 - 5.45 10e6/uL Final  . RDW 12/01/2015 14.9* 11.2 - 14.5 % Final  . lymph# 12/01/2015 2.1  0.9 - 3.3 10e3/uL Final  . MONO# 12/01/2015 0.3  0.1 - 0.9 10e3/uL Final  . Eosinophils Absolute 12/01/2015 0.0  0.0 - 0.5 10e3/uL Final  . Basophils Absolute 12/01/2015 0.1  0.0 - 0.1 10e3/uL Final  . NEUT% 12/01/2015 70.6  38.4 - 76.8 % Final  . LYMPH% 12/01/2015 24.4  14.0 - 49.7 % Final  . MONO% 12/01/2015 4.0  0.0 - 14.0 % Final  . EOS% 12/01/2015 0.1  0.0 - 7.0 % Final  . BASO% 12/01/2015 0.9  0.0 - 2.0 % Final  . Sodium 12/01/2015 139  136 - 145 mEq/L Final  . Potassium 12/01/2015 3.4* 3.5 - 5.1 mEq/L Final  . Chloride 12/01/2015 105  98 - 109 mEq/L Final  . CO2 12/01/2015 24  22 - 29 mEq/L Final  . Glucose 12/01/2015 293* 70 - 140 mg/dl Final   Glucose reference range is for nonfasting patients. Fasting glucose reference range is 70- 100.  [name] BUN 12/01/2015 9.1  7.0 - 26.0 mg/dL Final  . Creatinine 12/01/2015 1.1  0.6 - 1.1 mg/dL Final  . Total Bilirubin 12/01/2015 <0.30  0.20 - 1.20 mg/dL Final  . Alkaline Phosphatase 12/01/2015 72  40 - 150 U/L Final  . AST 12/01/2015 12  5 - 34 U/L Final  . ALT 12/01/2015 16  0 - 55 U/L Final  . Total Protein 12/01/2015 6.3* 6.4 - 8.3 g/dL Final  . Albumin 12/01/2015 3.4* 3.5 - 5.0 g/dL Final  . Calcium 12/01/2015 9.1  8.4 - 10.4 mg/dL Final  . Anion Gap 12/01/2015 10  3 - 11 mEq/L Final  . EGFR 12/01/2015 61* >90 ml/min/1.73 m2 Final   eGFR is calculated using the CKD-EPI Creatinine Equation (9480)    Complications: are: peripheral neuropathy and she is still fairly symptomatic with this.       Medication List       This list is accurate as of: 12/17/15  5:02 PM.  Always use your most recent med list.               amphetamine-dextroamphetamine 30 MG tablet  Commonly known as:  ADDERALL  Take 30 mg by mouth 2 (two) times daily.     atorvastatin 40 MG tablet  Commonly known as:  LIPITOR  Take 1 tablet (40 mg total) by mouth  daily at 6 PM.     canagliflozin 300 MG Tabs tablet  Commonly known as:  INVOKANA  Take 300 mg by mouth daily before breakfast.     CHANTIX 0.5 MG tablet  Generic drug:  varenicline  Take 1 mg by mouth daily.  chlorproMAZINE 200 MG tablet  Commonly known as:  THORAZINE  Take 200 mg by mouth at bedtime.     ciprofloxacin 500 MG tablet  Commonly known as:  CIPRO     cyclobenzaprine 10 MG tablet  Commonly known as:  FLEXERIL  Take 10 mg by mouth at bedtime as needed for muscle spasms.     diazepam 5 MG tablet  Commonly known as:  VALIUM  Take 5-10 mg by mouth at bedtime as needed for anxiety or muscle spasms.     diazepam 10 MG tablet  Commonly known as:  VALIUM     esomeprazole 40 MG capsule  Commonly known as:  NEXIUM  Take 40 mg by mouth daily.     fenofibrate 145 MG tablet  Commonly known as:  TRICOR  TAKE 1 TABLET (145 MG TOTAL) BY MOUTH DAILY.     freestyle lancets  Use as instructed to check blood sugar 3 times per day dx code E11.65     glucose blood test strip  Commonly known as:  FREESTYLE LITE  Use as instructed to check blood sugar 3 times per day dx code E11.65     insulin aspart protamine - aspart (70-30) 100 UNIT/ML FlexPen  Commonly known as:  NOVOLOG MIX 70/30 FLEXPEN  Inject 30 units in the morning and 20 units in the evening.     ketoconazole 2 % cream  Commonly known as:  NIZORAL     LANTUS SOLOSTAR 100 UNIT/ML Solostar Pen  Generic drug:  Insulin Glargine  INJECT 46 UNITS DAILY AT THE SAME TIME EVERY DAY. IF SUGAR GOES ABOVE 200 INCREASE DOSE TO 50     oxyCODONE 5 MG immediate release tablet  Commonly known as:  Oxy IR/ROXICODONE  Take 5 mg by mouth 2 (two) times daily as needed for moderate pain or severe pain.     oxyCODONE-acetaminophen 10-325 MG tablet  Commonly known as:  PERCOCET  Take 1 tablet by mouth 3 (three) times daily as needed for pain.     phentermine 37.5 MG tablet  Commonly known as:  ADIPEX-P  Take 37.5 mg by mouth  daily as needed (appetite control).     potassium chloride SA 20 MEQ tablet  Commonly known as:  K-DUR,KLOR-CON  Take 1 tablet (20 mEq total) by mouth daily.     predniSONE 5 MG tablet  Commonly known as:  DELTASONE     rizatriptan 10 MG tablet  Commonly known as:  MAXALT  Take 1 tablet (10 mg total) by mouth as needed for migraine. May repeat in 2 hours if needed     spironolactone 100 MG tablet  Commonly known as:  ALDACTONE  Take 1 tablet (100 mg total) by mouth daily.     tamsulosin 0.4 MG Caps capsule  Commonly known as:  FLOMAX  Take 0.4 mg by mouth daily.     VICTOZA 18 MG/3ML Sopn  Generic drug:  Liraglutide  Inject 0.2 mLs (1.2 mg total) into the skin daily. Inject once daily at the same time     Vortioxetine HBr 5 MG Tabs  Take 5 mg by mouth daily.     TRINTELLIX 20 MG Tabs  Generic drug:  Vortioxetine HBr  Take 20 mg by mouth daily.        Allergies:  Allergies  Allergen Reactions  . Latex Itching, Rash and Other (See Comments)    Pt states she cannot use condoms - cause an infection.  Use of latex on  skin is okay.  Tape causes rash  . Ibuprofen Other (See Comments)    headaches  . Sweetening Enhancer [Flavoring Agent] Nausea And Vomiting    And headaches  . Tetracyclines & Related Nausea And Vomiting    Yeast infections  . Trazodone And Nefazodone Other (See Comments)    Hallucinations   . Triazolam Other (See Comments)    hallucinations  . Aspartame And Phenylalanine Nausea And Vomiting    headaches    Past Medical History  Diagnosis Date  . Hypertension   . Hyperlipemia   . GERD (gastroesophageal reflux disease)   . Insomnia   . SUI (stress urinary incontinence, female) S/P SLING 12-29-2011  . SOB (shortness of breath) on exertion   . Bipolar disorder ([place])   . Neurogenic bladder   . Hypercholesterolemia   . Gastroparesis   . Ulcer   . MRSA (methicillin resistant Staphylococcus aureus)   . Chronic pain   . Edema   . Chest pain      a. 2008 Cath: normal cors;  b. 12/2013 Lexi CL: EF 47%, no ischemia/infarct.  . Anxiety   . Vertigo   . COPD (chronic obstructive pulmonary disease) ([place])   . Cholecystitis   . Hypersomnia   . Tobacco abuse   . Claudication ([place])     a. 12/2013 ABI's: R 0.97, L 0.94.  [name] Palpitations   . Anginal pain ([place])   . Pneumonia     "twice, I think" (02/12/2014)  . Sleep apnea     didn't tolerate cpap (02/12/2014)  . Type II diabetes mellitus ([place])   . History of blood transfusion 1986    "related to lost a child" (02/12/2014)  . H/O hiatal hernia   . HYIFOYDX(412.8)     "weekly" (02/12/2014)  . Stroke ([place]) 02/12/2014    "eyesight is messed up; not steady on my feet" (02/12/2014)  . Arthritis     "qwhere" (02/12/2014)  . DJD (degenerative joint disease)   . Chronic back pain   . Depression   . Personality disorder   . OSA (obstructive sleep apnea) 07/04/2014    Past Surgical History  Procedure Laterality Date  . Knee arthroscopy w/ allograft impant Left     graft x 2  . Foot surgery Bilateral   . Anterior cervical decomp/discectomy fusion  2000    C6 - 7  . Pubovaginal sling  12/29/2011    Procedure: Gaynelle Arabian;  Surgeon: Reece Packer, MD;  Location: [facility];  Service: Urology;  Laterality: N/A;  cysto and sparc sling   . Lumbar fusion      "cage in my spine"  . Total abdominal hysterectomy w/ bilateral salpingoophorectomy  1997  . Multiple laparoscopies for endometriosis    . Cesarean section  1989; 1992  . Repeat reconstruction acl left knee/ screws removed  03-28-2000    CADAVER GRAFT  . 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 Knee surgery      TOTAL 8 SURG'S  . Cystoscopy with injection  05/04/2012    Procedure: CYSTOSCOPY WITH INJECTION;  Surgeon: Reece Packer, MD;  Location[facility];  Service: Urology;  Laterality: N/A;  MACROPLASTIQUE INJECTION  . Cystoscopy with injection  08/28/2012    Procedure: CYSTOSCOPY  WITH INJECTION;  Surgeon: Reece Packer, MD;  Location: [facility];  Service: Urology;  Laterality: N/A;  cysto and macroplastique   . Carpal tunnel release Right 04-25-2013  .  Cysto N/A 04/30/2013    Procedure: CYSTOSCOPY;  Surgeon: Reece Packer, MD;  Location: [facility];  Service: Urology;  Laterality: N/A;  . Pubovaginal sling N/A 04/30/2013    Procedure: REMOVAL OF VAGINAL MESH;  Surgeon: Reece Packer, MD;  Location: [facility];  Service: Urology;  Laterality: N/A;  . Abdominal hysterectomy    . Hernia repair  ?1996    "stomach"  . Cardiac catheterization  05-22-2008   DR SKAINS    NO SIGNIFECANT CAD/ NORMAL LV/  EF 65%/  NO WALL MOTION ABNORMALITIES  . Cardiac catheterization  02/12/2014  . Left heart catheterization with coronary angiogram N/A 02/12/2014    Procedure: LEFT HEART CATHETERIZATION WITH CORONARY ANGIOGRAM;  Surgeon: Candee Furbish, MD;  Location: [facility] CATH LAB;  Service: Cardiovascular;  Laterality: N/A;  . Laparoscopic sigmoid colectomy  04/22/2015  . Laparoscopic sigmoid colectomy N/A 04/22/2015    Procedure: LAPAROSCOPIC HAND ASSISTED SIGMOID COLECTOMY;  Surgeon: Erroll Luna, MD;  Location: [facility] OR;  Service: General;  Laterality: N/A;    Family History  Problem Relation Age of Onset  . Hypertension Mother   . Diabetes Mother   . Cancer - Other Mother     lymphoma   . Cancer - Other Father     lung, bladder cancer   . Heart attack Father   . Cancer - Other Brother     bladder cancer     Social History:  reports that she has been smoking Cigarettes and E-cigarettes.  She has a 41 pack-year smoking history. She has never used smokeless tobacco. She reports that she drinks alcohol. She reports that she does not use illicit drugs.  Review of Systems -   She has history of high triglycerides treated with fenofibrate   Currently she is taking Lipitor  also  Lab Results  Component Value Date   CHOL 124 08/12/2015   HDL 39.50 08/12/2015   LDLCALC  49 08/12/2015   LDLDIRECT 57.0 03/09/2015   TRIG 177.0* 08/12/2015   CHOLHDL 3 08/12/2015    Hypertension: This has been relatively mild and controlled . She is taking spironolactone 2 tablets daily of the 25 mg  HYPOKALEMIA: This has recurred and this is despite taking Aldactone 50 mg Last potassium was 3.4 and she is still taking potassium supplement   Painful neuropathy: She has had continued symptoms including leg pains and numbness as well as difficulty with balance and has tried various drugs including gabapentin without much relief  Foot exam shows absent pedal pulses and distal sensation   Examination:   BP 124/82 mmHg  Pulse 86  Temp(Src) 98 F (36.7 C)  Resp 16  Ht _0  (1.575 m)  Wt 217 lb 6.4 oz (98.612 kg)  BMI 39.75 kg/m2  SpO2 97%  Body mass index is 39.75 kg/(m^2).   1+ in lower leg edema present    Assesment/Plan:   1. Diabetes type 2, with fair control   The patient's diabetes control is continuing to be poor despite adding Victoza Currently only on basal insulin and Invokana otherwise Most likely developing insulin deficiency and needs basal bolus regimen She is also very noncompliant with checking her blood sugar or watching her sweet drinks Is totally non-motivated to take care of her diabetes Not even with glucose readings mostly over 300 she is losing weight  Recommendations:  Discussed that it is important that she started watching her diet and eliminating drinks which sugar and drink more water.  Change Lantus to  Novolog Mix 30 units in the morning and 20 units before supper  Start checking blood sugars 3 times a day  Continue Invokana  Stopped Victoza  Consistent diet with some protein at each meal  Needs to avoid taking prednisone because it will cause hyperglycemia and not clear why she has facial swelling  2. Hypertension: Blood pressure is  normal with Aldactone only; not clear why she is developing edema  3.  Hypokalemia: This  is not controlled even with 50 mg of Aldactone and since she has some edema Will increase her Aldactone to 100 mg and stop potassium  4.  Lipids: recheck levels on the next visit   5.  Swelling of face: Not clear why this is occurring,she should have an allergy evaluation if this is the diagnosis made by PCP   Patient Instructions  Check blood sugars on waking up 3  times a week Also check blood sugars about 2 hours after a meal and do this after different meals by rotation  Recommended blood sugar levels on waking up is 90-130 and about 2 hours after meal is 130-160  Please bring your blood sugar monitor to each visit, thank you  Change Lantus to Novolog mix 30 units in am on getting up and 20 at supper  Stop Victoza  Spironolactone 133m daily and stop potassium  No more mountain Dew       Counseling time on subjects discussed above is over 50% of today's 25 minute visit   Jamylah Marinaccio 12/17/2015, 5:02 PM

## 2016-01-01 ENCOUNTER — Other Ambulatory Visit (INDEPENDENT_AMBULATORY_CARE_PROVIDER_SITE_OTHER): Payer: Medicare Other

## 2016-01-01 DIAGNOSIS — R5381 Other malaise: Secondary | ICD-10-CM | POA: Diagnosis not present

## 2016-01-01 DIAGNOSIS — R5383 Other fatigue: Secondary | ICD-10-CM | POA: Diagnosis not present

## 2016-01-01 DIAGNOSIS — Z794 Long term (current) use of insulin: Secondary | ICD-10-CM | POA: Diagnosis not present

## 2016-01-01 DIAGNOSIS — E1165 Type 2 diabetes mellitus with hyperglycemia: Secondary | ICD-10-CM

## 2016-01-01 DIAGNOSIS — E876 Hypokalemia: Secondary | ICD-10-CM

## 2016-01-01 LAB — T4, FREE: Free T4: 1.02 ng/dL (ref 0.60–1.60)

## 2016-01-01 LAB — TSH: TSH: 4.08 u[IU]/mL (ref 0.35–4.50)

## 2016-01-01 LAB — BASIC METABOLIC PANEL
BUN: 14 mg/dL (ref 6–23)
CHLORIDE: 94 meq/L — AB (ref 96–112)
CO2: 29 meq/L (ref 19–32)
CREATININE: 0.9 mg/dL (ref 0.40–1.20)
Calcium: 9.8 mg/dL (ref 8.4–10.5)
GFR: 70.27 mL/min (ref >60.00)
Glucose, Bld: 291 mg/dL — ABNORMAL HIGH (ref 70–99)
Potassium: 4.1 mEq/L (ref 3.5–5.1)
Sodium: 133 mEq/L — ABNORMAL LOW (ref 135–145)

## 2016-01-01 LAB — HEMOGLOBIN A1C: HEMOGLOBIN A1C: 8.4 % — AB (ref 4.6–6.5)

## 2016-01-01 LAB — LIPID PANEL
CHOL/HDL RATIO: 3
Cholesterol: 125 mg/dL (ref 0–200)
HDL: 47.1 mg/dL (ref >39.00)
LDL CALC: 41 mg/dL (ref 0–99)
NonHDL: 77.93
TRIGLYCERIDES: 187 mg/dL — AB (ref 0.0–149.0)
VLDL: 37.4 mg/dL (ref 0.0–40.0)

## 2016-01-01 LAB — MAGNESIUM: Magnesium: 1.6 mg/dL (ref 1.5–2.5)

## 2016-01-02 LAB — FRUCTOSAMINE: FRUCTOSAMINE: 280 umol/L (ref 0–285)

## 2016-01-06 ENCOUNTER — Encounter: Payer: Self-pay | Admitting: Endocrinology

## 2016-01-06 ENCOUNTER — Other Ambulatory Visit: Payer: Self-pay | Admitting: *Deleted

## 2016-01-06 ENCOUNTER — Ambulatory Visit (INDEPENDENT_AMBULATORY_CARE_PROVIDER_SITE_OTHER): Payer: Medicare Other | Admitting: Endocrinology

## 2016-01-06 VITALS — BP 122/76 | HR 112 | Temp 98.1°F | Resp 16 | Ht 62.0 in | Wt 215.8 lb

## 2016-01-06 DIAGNOSIS — E876 Hypokalemia: Secondary | ICD-10-CM

## 2016-01-06 DIAGNOSIS — E1165 Type 2 diabetes mellitus with hyperglycemia: Secondary | ICD-10-CM | POA: Diagnosis not present

## 2016-01-06 DIAGNOSIS — L97511 Non-pressure chronic ulcer of other part of right foot limited to breakdown of skin: Secondary | ICD-10-CM

## 2016-01-06 DIAGNOSIS — E782 Mixed hyperlipidemia: Secondary | ICD-10-CM

## 2016-01-06 DIAGNOSIS — Z794 Long term (current) use of insulin: Secondary | ICD-10-CM | POA: Diagnosis not present

## 2016-01-06 MED ORDER — FUROSEMIDE 20 MG PO TABS: ORAL_TABLET | ORAL | Status: DC

## 2016-01-06 MED ORDER — CANAGLIFLOZIN 300 MG PO TABS: 300.0000 mg | ORAL_TABLET | Freq: Every day | ORAL | Status: DC

## 2016-01-06 NOTE — Progress Notes (Signed)
Patient ID: [name], female   DOB: 1966/03/10, 50 y.o.   MRN: [id number]   Reason for Appointment: Diabetes follow-up    History of Present Illness:   Diagnosis: Type 2 diabetes mellitus, date of diagnosis: 2004.  PAST history: She has been treated mostly with insulin since about a year after diagnosis. She has had difficulty with consistent compliance with diet and also compliance with self care including glucose monitoring over the years. She had been mostly treated with basal insulin. Also had been tried on mealtime insulin but she would be noncompliant with this. Was tried on Prandin for mealtime control but difficult to judge efficacy because of lack of postprandial monitoring. Was given Victoza to start in 2011 but did not follow up after this.She was tried on premixed insulin but this did not help her control, mostly because of noncompliance with the doses. She had been using the V- go pump and had better control initially and was better compliant with the daily routine and boluses. She has had frequent education visits also. She stopped using her V.-go pump in 6/15 because of discomfort at the site of application   RECENT history:    INSULIN dose:  Novolog Mix 30 units qd,  Occasionally 20 in pm    Her A1c previously has been consistently under 7% and more recently blood sugars had been poorly controlled with basal insulin and Invokana Also did not improve with a trial of Victoza On her last visit in 12/16 she was told to stop Lantus and start Novolog Mix insulin twice a day  Current management and problems identified:  She has not checked her blood sugars much again but she thinks they are mostly over 200 in the morning  Lab glucose was 291 but she may have had a sweet drink before this  She does not remember to take her evening insulin and usually not taking this.  She did not understand that she could take the insulin right after eating if she  forgot  Although her fructosamine is not significantly high the lab glucose is still high   She is at least trying to cut back on her sweet drinks and using more Gatorade and water instead of [employer]  Glucometer:  FreeStyle.  Checking blood sugar very sporadically and did not bring her monitor for review as before Blood Glucose readings: Fasting reading mostly over 200  Hypoglycemia frequency: Never.    Food preferences: eating 1 or 2 meals per day, variable intake, sandwiches at times or otherwise snacks, dinner usually 8 PM  Physical activity: exercise: Minimal.  She has difficulties with leg pain Certified Diabetes Educator visit: Most recent:, 2/14.   The diet that the patient has been following is no specific diet; still eating at irregular times; still drinking drinks with sugar claiming she is allergic to artificial sweeteners   Wt Readings from Last 3 Encounters:  01/06/16 215 lb 12.8 oz (97.886 kg)  12/17/15 217 lb 6.4 oz (98.612 kg)  12/01/15 218 lb 8 oz (99.111 kg)   DM labs:  Lab Results  Component Value Date   HGBA1C 8.4* 01/01/2016   HGBA1C 8.3* 11/09/2015   HGBA1C 6.8 08/12/2015   Lab Results  Component Value Date   MICROALBUR <0.7 08/12/2015   LDLCALC 41 01/01/2016   CREATININE 0.90 01/01/2016    Lab on 01/01/2016  Component Date Value Ref Range Status  . Magnesium 01/01/2016 1.6  1.5 - 2.5 mg/dL Final  . Fructosamine  01/01/2016 280  0 - 285 umol/L Final   Comment: Published reference interval for apparently healthy subjects between age 44 and 90 is 84 - 285 umol/L and in a poorly controlled diabetic population is 228 - 563 umol/L with a mean of 396 umol/L.   [name] TSH 01/01/2016 4.08  0.35 - 4.50 uIU/mL Final  . Free T4 01/01/2016 1.02  0.60 - 1.60 ng/dL Final  . Hgb A1c MFr Bld 01/01/2016 8.4* 4.6 - 6.5 % Final   Glycemic Control Guidelines for People with Diabetes:Non Diabetic:  <6%Goal of Therapy: <7%Additional Action Suggested:  >8%   . Sodium  01/01/2016 133* 135 - 145 mEq/L Final  . Potassium 01/01/2016 4.1  3.5 - 5.1 mEq/L Final  . Chloride 01/01/2016 94* 96 - 112 mEq/L Final  . CO2 01/01/2016 29  19 - 32 mEq/L Final  . Glucose, Bld 01/01/2016 291* 70 - 99 mg/dL Final  . BUN 01/01/2016 14  6 - 23 mg/dL Final  . Creatinine, Ser 01/01/2016 0.90  0.40 - 1.20 mg/dL Final  . Calcium 01/01/2016 9.8  8.4 - 10.5 mg/dL Final  . GFR 01/01/2016 70.27  >60.00 mL/min Final  . Cholesterol 01/01/2016 125  0 - 200 mg/dL Final   ATP III Classification       Desirable:  < 200 mg/dL               Borderline High:  200 - 239 mg/dL          High:  > = 240 mg/dL  . Triglycerides 01/01/2016 187.0* 0.0 - 149.0 mg/dL Final   Normal:  <150 mg/dLBorderline High:  150 - 199 mg/dL  . HDL 01/01/2016 47.10  >39.00 mg/dL Final  . VLDL 01/01/2016 37.4  0.0 - 40.0 mg/dL Final  . LDL Cholesterol 01/01/2016 41  0 - 99 mg/dL Final  . Total CHOL/HDL Ratio 01/01/2016 3   Final                  Men          Women1/2 Average Risk     3.4          3.3Average Risk          5.0          4.42X Average Risk          9.6          7.13X Average Risk          15.0          11.0                      . NonHDL 01/01/2016 77.93   Final   NOTE:  Non-HDL goal should be 30 mg/dL higher than patient's LDL goal (i.e. LDL goal of < 70 mg/dL, would have non-HDL goal of < 123XX123 mg/dL)    Complications: are: peripheral neuropathy and she is still fairly symptomatic with this.       Medication List       This list is accurate as of: 01/06/16  4:23 PM.  Always use your most recent med list.               amphetamine-dextroamphetamine 30 MG tablet  Commonly known as:  ADDERALL  Take 30 mg by mouth 2 (two) times daily.     atorvastatin 40 MG tablet  Commonly known as:  LIPITOR  Take 1 tablet (40 mg total) by mouth daily  at 6 PM.     canagliflozin 300 MG Tabs tablet  Commonly known as:  INVOKANA  Take 300 mg by mouth daily before breakfast.     CHANTIX 0.5 MG tablet   Generic drug:  varenicline  Take 1 mg by mouth daily.     chlorproMAZINE 200 MG tablet  Commonly known as:  THORAZINE  Take 200 mg by mouth at bedtime.     ciprofloxacin 500 MG tablet  Commonly known as:  CIPRO     cyclobenzaprine 10 MG tablet  Commonly known as:  FLEXERIL  Take 10 mg by mouth at bedtime as needed for muscle spasms.     diazepam 5 MG tablet  Commonly known as:  VALIUM  Take 5-10 mg by mouth at bedtime as needed for anxiety or muscle spasms.     diazepam 10 MG tablet  Commonly known as:  VALIUM     esomeprazole 40 MG capsule  Commonly known as:  NEXIUM  Take 40 mg by mouth daily.     fenofibrate 145 MG tablet  Commonly known as:  TRICOR  TAKE 1 TABLET (145 MG TOTAL) BY MOUTH DAILY.     freestyle lancets  Use as instructed to check blood sugar 3 times per day dx code E11.65     furosemide 20 MG tablet  Commonly known as:  LASIX  Take 1 tablet by mouth as needed     glucose blood test strip  Commonly known as:  FREESTYLE LITE  Use as instructed to check blood sugar 3 times per day dx code E11.65     insulin aspart protamine - aspart (70-30) 100 UNIT/ML FlexPen  Commonly known as:  NOVOLOG MIX 70/30 FLEXPEN  Inject 30 units in the morning and 20 units in the evening.     ketoconazole 2 % cream  Commonly known as:  NIZORAL     LANTUS SOLOSTAR 100 UNIT/ML Solostar Pen  Generic drug:  Insulin Glargine  INJECT 46 UNITS DAILY AT THE SAME TIME EVERY DAY. IF SUGAR GOES ABOVE 200 INCREASE DOSE TO 50     oxyCODONE 5 MG immediate release tablet  Commonly known as:  Oxy IR/ROXICODONE  Take 5 mg by mouth 2 (two) times daily as needed for moderate pain or severe pain.     oxyCODONE-acetaminophen 10-325 MG tablet  Commonly known as:  PERCOCET  Take 1 tablet by mouth 3 (three) times daily as needed for pain.     phentermine 37.5 MG tablet  Commonly known as:  ADIPEX-P  Take 37.5 mg by mouth daily as needed (appetite control).     potassium chloride SA 20  MEQ tablet  Commonly known as:  K-DUR,KLOR-CON  Take 1 tablet (20 mEq total) by mouth daily.     predniSONE 5 MG tablet  Commonly known as:  DELTASONE     rizatriptan 10 MG tablet  Commonly known as:  MAXALT  Take 1 tablet (10 mg total) by mouth as needed for migraine. May repeat in 2 hours if needed     spironolactone 100 MG tablet  Commonly known as:  ALDACTONE  Take 1 tablet (100 mg total) by mouth daily.     tamsulosin 0.4 MG Caps capsule  Commonly known as:  FLOMAX  Take 0.4 mg by mouth daily.     Vortioxetine HBr 5 MG Tabs  Take 5 mg by mouth daily.     TRINTELLIX 20 MG Tabs  Generic drug:  Vortioxetine HBr  Take 20 mg  by mouth daily.        Allergies:  Allergies  Allergen Reactions  . Latex Itching, Rash and Other (See Comments)    Pt states she cannot use condoms - cause an infection.  Use of latex on skin is okay.  Tape causes rash  . Ibuprofen Other (See Comments)    headaches  . Sweetening Enhancer [Flavoring Agent] Nausea And Vomiting    And headaches  . Tetracyclines & Related Nausea And Vomiting    Yeast infections  . Trazodone And Nefazodone Other (See Comments)    Hallucinations   . Triazolam Other (See Comments)    hallucinations  . Aspartame And Phenylalanine Nausea And Vomiting    headaches    Past Medical History  Diagnosis Date  . Hypertension   . Hyperlipemia   . GERD (gastroesophageal reflux disease)   . Insomnia   . SUI (stress urinary incontinence, female) S/P SLING 12-29-2011  . SOB (shortness of breath) on exertion   . Bipolar disorder ([place])   . Neurogenic bladder   . Hypercholesterolemia   . Gastroparesis   . Ulcer   . MRSA (methicillin resistant Staphylococcus aureus)   . Chronic pain   . Edema   . Chest pain     a. 2008 Cath: normal cors;  b. 12/2013 Lexi CL: EF 47%, no ischemia/infarct.  . Anxiety   . Vertigo   . COPD (chronic obstructive pulmonary disease) ([place])   . Cholecystitis   . Hypersomnia   . Tobacco abuse    . Claudication ([place])     a. 12/2013 ABI's: R 0.97, L 0.94.  [name] Palpitations   . Anginal pain ([place])   . Pneumonia     "twice, I think" (02/12/2014)  . Sleep apnea     didn't tolerate cpap (02/12/2014)  . Type II diabetes mellitus ([place])   . History of blood transfusion 1986    "related to lost a child" (02/12/2014)  . H/O hiatal hernia   . [id number])     "weekly" (02/12/2014)  . Stroke ([place]) 02/12/2014    "eyesight is messed up; not steady on my feet" (02/12/2014)  . Arthritis     "qwhere" (02/12/2014)  . DJD (degenerative joint disease)   . Chronic back pain   . Depression   . Personality disorder   . OSA (obstructive sleep apnea) 07/04/2014    Past Surgical History  Procedure Laterality Date  . Knee arthroscopy w/ allograft impant Left     graft x 2  . Foot surgery Bilateral   . Anterior cervical decomp/discectomy fusion  2000    C6 - 7  . Pubovaginal sling  12/29/2011    Procedure: Gaynelle Arabian;  Surgeon: Reece Packer, MD;  Location: [facility];  Service: Urology;  Laterality: N/A;  cysto and sparc sling   . Lumbar fusion      "cage in my spine"  . Total abdominal hysterectomy w/ bilateral salpingoophorectomy  1997  . Multiple laparoscopies for endometriosis    . Cesarean section  1989; 1992  . Repeat reconstruction acl left knee/ screws removed  03-28-2000    CADAVER GRAFT  . 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 Knee surgery      TOTAL 8 SURG'S  . Cystoscopy with injection  05/04/2012    Procedure: CYSTOSCOPY WITH INJECTION;  Surgeon: Reece Packer, MD;  Location: [facility];  Service: Urology;  Laterality: N/A;  MACROPLASTIQUE INJECTION  . Cystoscopy with  injection  08/28/2012    Procedure: CYSTOSCOPY WITH INJECTION;  Surgeon: Reece Packer, MD;  Location: [facility];  Service: Urology;  Laterality: N/A;  cysto and macroplastique   . Carpal tunnel release Right 04-25-2013  . Cysto N/A  04/30/2013    Procedure: CYSTOSCOPY;  Surgeon: Reece Packer, MD;  Location: [facility];  Service: Urology;  Laterality: N/A;  . Pubovaginal sling N/A 04/30/2013    Procedure: REMOVAL OF VAGINAL MESH;  Surgeon: Reece Packer, MD;  Location: [facility];  Service: Urology;  Laterality: N/A;  . Abdominal hysterectomy    . Hernia repair  ?1996    "stomach"  . Cardiac catheterization  05-22-2008   DR SKAINS    NO SIGNIFECANT CAD/ NORMAL LV/  EF 65%/  NO WALL MOTION ABNORMALITIES  . Cardiac catheterization  02/12/2014  . Left heart catheterization with coronary angiogram N/A 02/12/2014    Procedure: LEFT HEART CATHETERIZATION WITH CORONARY ANGIOGRAM;  Surgeon: Candee Furbish, MD;  Location: [facility] CATH LAB;  Service: Cardiovascular;  Laterality: N/A;  . Laparoscopic sigmoid colectomy  04/22/2015  . Laparoscopic sigmoid colectomy N/A 04/22/2015    Procedure: LAPAROSCOPIC HAND ASSISTED SIGMOID COLECTOMY;  Surgeon: Erroll Luna, MD;  Location: [facility] OR;  Service: General;  Laterality: N/A;    Family History  Problem Relation Age of Onset  . Hypertension Mother   . Diabetes Mother   . Cancer - Other Mother     lymphoma   . Cancer - Other Father     lung, bladder cancer   . Heart attack Father   . Cancer - Other Brother     bladder cancer     Social History:  reports that she has been smoking Cigarettes and E-cigarettes.  She has a 41 pack-year smoking history. She has never used smokeless tobacco. She reports that she drinks alcohol. She reports that she does not use illicit drugs.  Review of Systems -   She is asking about her foot ulcer Apparently she has had recurrent foot ulcer on the right distal foot treated [facility].  Recently had some bleeding from this area but does not complain of any increased swelling or pain, not having any infection recently  She has history of high triglycerides treated with fenofibrate   Currently she is taking Lipitor  also  Lab Results   Component Value Date   CHOL 125 01/01/2016   HDL 47.10 01/01/2016   LDLCALC 41 01/01/2016   LDLDIRECT 57.0 03/09/2015   TRIG 187.0* 01/01/2016   CHOLHDL 3 01/01/2016    Hypertension: This has been relatively mild and controlled . She is taking spironolactone, now 100 mg daily  HYPOKALEMIA: This has been a recurrent problem but now improved with increasing Aldactone, not taking potassium now  Painful neuropathy: She has had continued symptoms including leg pains and numbness as well as difficulty with balance and has tried various drugs including gabapentin without much relief  Foot exam shows absent pedal pulses and distal sensation   Examination:   BP 122/76 mmHg  Pulse 112  Temp(Src) 98.1 F (36.7 C)  Resp 16  Ht 5\' 2"  (1.575 m)  Wt 215 lb 12.8 oz (97.886 kg)  BMI 39.46 kg/m2  SpO2 99%  Body mass index is 39.46 kg/(m^2).   No edema present. Absent monofilament sensation in the distal foot Right medial plantar surface has a clean 1 cm shallow ulcer without any redness and no significant warmth.  The large toe  is relatively larger than the left, No tenderness or deformity  Assesment/Plan:   1. Diabetes type 2, with fair control   The patient's diabetes control is probably improving with using premixed insulin compared to Lantus as her fructosamine is not significantly high She is trying to do better with her compliance with diet and avoiding sweet drinks and this may be helping control also She is however not taking her evening insulin as directed Since she does not get hypoglycemic when she takes the premixed insulin twice a day is probably on an appropriate dose for now May also be benefiting somewhat from Invokana  Recommendations:  Discussed timing of insulin and may take it right after supper if forgetting to take it  To call if she has any hypoglycemia  Continue to improve diet  Start checking blood sugars consistently at least every other day at various  times and bring monitor for download  2. Hypertension: Blood pressure is  normal with Aldactone only  3.  Hypokalemia: This is  controlled with 100 mg of Aldactone   4.  Lipids: Excellent control with fenofibrate and Lipitor  5.  Swelling of ankles: claims that she is having some swelling of her feet when she is up and about and is asking for diuretic.  Discussed that she can take Lasix only occasionally because of tendency to hypokalemia  6.  Neuropathic ulcer: Does not appear infected and will refer to podiatrist, likely will need diabetic shoes   Patient Instructions  Take 20 units at supper within 30 min of meal  Check blood sugars on waking up 2-3  times a week Also check blood sugars about 2 hours after a meal and do this after different meals by rotation  Recommended blood sugar levels on waking up is 90-130 and about 2 hours after meal is 130-160  Please bring your blood sugar monitor to each visit, thank you      Counseling time on subjects discussed above is over 50% of today's 25 minute visit   Amaria Mundorf 01/06/2016, 4:23 PM

## 2016-01-06 NOTE — Patient Instructions (Addendum)
Take 20 units at supper within 30 min of meal  Check blood sugars on waking up 2-3  times a week Also check blood sugars about 2 hours after a meal and do this after different meals by rotation  Recommended blood sugar levels on waking up is 90-130 and about 2 hours after meal is 130-160  Please bring your blood sugar monitor to each visit, thank you

## 2016-01-12 ENCOUNTER — Ambulatory Visit (INDEPENDENT_AMBULATORY_CARE_PROVIDER_SITE_OTHER): Payer: Medicare Other

## 2016-01-12 ENCOUNTER — Ambulatory Visit (INDEPENDENT_AMBULATORY_CARE_PROVIDER_SITE_OTHER): Payer: Medicare Other | Admitting: Podiatry

## 2016-01-12 VITALS — BP 137/91 | HR 104 | Resp 14

## 2016-01-12 DIAGNOSIS — E11622 Type 2 diabetes mellitus with other skin ulcer: Secondary | ICD-10-CM | POA: Diagnosis not present

## 2016-01-12 DIAGNOSIS — M79671 Pain in right foot: Secondary | ICD-10-CM | POA: Diagnosis not present

## 2016-01-12 DIAGNOSIS — L89891 Pressure ulcer of other site, stage 1: Secondary | ICD-10-CM | POA: Diagnosis not present

## 2016-01-12 DIAGNOSIS — E11621 Type 2 diabetes mellitus with foot ulcer: Secondary | ICD-10-CM

## 2016-01-12 DIAGNOSIS — L97501 Non-pressure chronic ulcer of other part of unspecified foot limited to breakdown of skin: Secondary | ICD-10-CM

## 2016-01-12 MED ORDER — CEPHALEXIN 500 MG PO CAPS: 500.0000 mg | ORAL_CAPSULE | Freq: Four times a day (QID) | ORAL | Status: DC

## 2016-01-12 MED ORDER — FLUCONAZOLE 150 MG PO TABS: 150.0000 mg | ORAL_TABLET | Freq: Once | ORAL | Status: DC

## 2016-01-12 NOTE — Progress Notes (Signed)
   Subjective:    Patient ID: [name], female    DOB: [birth year], 50 y.o.   MRN: [id number]  HPI this patient presents to my office with chief complaint of a hole in the bottom of her right foot. She says she noticed blood on the floor approximately 3 months ago, but did not know that the blood was coming from her foot.   When she realized it was her blood  she was seen by her PCP and was administered antibiotics. She says that the hole in the foot cleared up at that time. She says the hole in the foot has really opened in her right foot at the time of this visit.  There is no pain but redness at big toe joint. This patient admits to diabetes, poor circulation, high blood pressure, stroke and February 2016 and colon cancer. She presents for an evaluation and treatment of her feet The patient is here today for right foot that has possible ulcer that she first noticed 3 months ago.    Review of Systems  All other systems reviewed and are negative.      Objective:   Physical Exam GENERAL APPEARANCE: Alert, conversant. Appropriately groomed. No acute distress.  VASCULAR: Pedal pulses palpable at  [facility] and PT right.  Her DP and PT left foot is absent..  Capillary refill time is immediate to all digits,  Normal temperature gradient.   NEUROLOGIC: sensation is absent  to 5.07 monofilament at 5/5 sites bilateral.  Light touch is intact bilateral, Muscle strength normal.  MUSCULOSKELETAL: acceptable muscle strength, tone and stability bilateral.  Intrinsic muscluature intact bilateral.  Rectus appearance of foot and digits noted bilateral.   DERMATOLOGIC: skin color, texture, and turgor are within normal limits.  Her ulcer is present under 1st MPJ right foot.  No malodor.  Necrotic tissue noted right foot.  Mild redness at dorsomedial aspect 1st MPJ.           Assessment & Plan:  Diabetic ulcer right foot  Plan  IE   Xray reveals no pathology.  Debride necrotic tissue.  Neosporin/DSD.   Surgical shoe was dispensed with dispersion padding.  Prescribed Cephalexin 500 mg. 1 qid.  Diflucan was also ordered. Home soak instructions followed by rebandaging.   Gardiner Barefoot DPM

## 2016-01-22 ENCOUNTER — Ambulatory Visit (INDEPENDENT_AMBULATORY_CARE_PROVIDER_SITE_OTHER): Payer: Medicare Other | Admitting: Podiatry

## 2016-01-22 DIAGNOSIS — L89891 Pressure ulcer of other site, stage 1: Secondary | ICD-10-CM | POA: Diagnosis not present

## 2016-01-22 DIAGNOSIS — L97501 Non-pressure chronic ulcer of other part of unspecified foot limited to breakdown of skin: Principal | ICD-10-CM

## 2016-01-22 DIAGNOSIS — E11621 Type 2 diabetes mellitus with foot ulcer: Secondary | ICD-10-CM

## 2016-01-23 NOTE — Progress Notes (Signed)
Subjective:     Patient ID: [name], female   DOB: 15-Apr-1966, 50 y.o.   MRN: [id number]  HPI this patient returns to the office for reevaluation and care of a diabetic ulcer under the big toe joint of the right foot. She was seen last week and the necrotic tissue was divided from the ulcer and she was prescribed cephalexin for her infection. She presents the office stating she is neurotropic and she does not feel improvement in her foot. She has been wearing a wooden shoe with appropriate padding for the last week. She presents for an evaluation and treatment   Review of Systems     Objective:   Physical Exam GENERAL APPEARANCE: Alert, conversant. Appropriately groomed. No acute distress.  VASCULAR: Pedal pulses palpable at  [facility] and PT right.  DP and PT non palpable left foot..  Capillary refill time is immediate to all digits,  Normal temperature gradient.  Digital hair growth is present bilateral  NEUROLOGIC: sensation is absent  to 5.07 monofilament at 5/5 sites bilateral.  Light touch is intact bilateral, Muscle strength normal.  MUSCULOSKELETAL: acceptable muscle strength, tone and stability bilateral.  Intrinsic muscluature intact bilateral.  Rectus appearance of foot and digits noted bilateral.   DERMATOLOGIC: skin color, texture, and turgor are within normal limits.  She has healing diabetic ulcer under the ball of her right foot.  Her ulcer measures 5 x 5 mm.  No malodor.  Healthy granulation tissue at site of ulcer.  No drainage or infection noted at this time.      Assessment:     Diabetic Ulcer right foot.     Plan:     Debride necrotic tissue.  Continue home soaks.  Ambulate with surgical shoe.RTC  3 weks.  Gardiner Barefoot DPM

## 2016-02-05 ENCOUNTER — Ambulatory Visit (INDEPENDENT_AMBULATORY_CARE_PROVIDER_SITE_OTHER): Payer: Medicare Other | Admitting: Podiatry

## 2016-02-05 DIAGNOSIS — L89891 Pressure ulcer of other site, stage 1: Secondary | ICD-10-CM

## 2016-02-05 DIAGNOSIS — E11621 Type 2 diabetes mellitus with foot ulcer: Secondary | ICD-10-CM | POA: Diagnosis not present

## 2016-02-05 DIAGNOSIS — M79671 Pain in right foot: Secondary | ICD-10-CM

## 2016-02-05 DIAGNOSIS — L97501 Non-pressure chronic ulcer of other part of unspecified foot limited to breakdown of skin: Principal | ICD-10-CM

## 2016-02-05 NOTE — Progress Notes (Signed)
Subjective:     Patient ID: [name], female   DOB: June 24, 1966, 50 y.o.   MRN: [id number]  HPI this patient returns to the office for reevaluation and care of a diabetic ulcer under the big toe joint of the right foot. She presents the office stating she is neurotropic. She says her ulcer is feeling better with her soaks and bandaging. She returns today for continued evaluation and treatment.   Review of Systems     Objective:   Physical Exam GENERAL APPEARANCE: Alert, conversant. Appropriately groomed. No acute distress.  VASCULAR: Pedal pulses palpable at  [facility] and PT right.  DP and PT non palpable left foot..  Capillary refill time is immediate to all digits,  Normal temperature gradient.  Digital hair growth is present bilateral  NEUROLOGIC: sensation is absent  to 5.07 monofilament at 5/5 sites bilateral.  Light touch is intact bilateral, Muscle strength normal.  MUSCULOSKELETAL: acceptable muscle strength, tone and stability bilateral.  Intrinsic muscluature intact bilateral.  Rectus appearance of foot and digits noted bilateral.   DERMATOLOGIC: skin color, texture, and turgor are within normal limits.  She has healing diabetic ulcer under the ball of her right foot.  Her ulcer measures 5 x 5 mm.  No malodor.  Healthy granulation tissue at site of ulcer.  No drainage or infection noted at this time.      Assessment:     Diabetic Ulcer right foot.     Plan:     Debride necrotic tissue.  Continue home soaks.  Application of helix 3 with home instructions.  Told her to continue wearing surgical shoe. No evidence of infection.  RTC 2 weeks.   Gardiner Barefoot DPM  Gardiner Barefoot DPM

## 2016-02-19 ENCOUNTER — Telehealth: Payer: Self-pay | Admitting: *Deleted

## 2016-02-19 ENCOUNTER — Ambulatory Visit (INDEPENDENT_AMBULATORY_CARE_PROVIDER_SITE_OTHER): Payer: Medicare Other

## 2016-02-19 ENCOUNTER — Ambulatory Visit (INDEPENDENT_AMBULATORY_CARE_PROVIDER_SITE_OTHER): Payer: Medicare Other | Admitting: Podiatry

## 2016-02-19 ENCOUNTER — Encounter: Payer: Self-pay | Admitting: Podiatry

## 2016-02-19 ENCOUNTER — Other Ambulatory Visit: Payer: Self-pay | Admitting: Obstetrics and Gynecology

## 2016-02-19 DIAGNOSIS — R52 Pain, unspecified: Secondary | ICD-10-CM

## 2016-02-19 DIAGNOSIS — L89891 Pressure ulcer of other site, stage 1: Secondary | ICD-10-CM | POA: Diagnosis not present

## 2016-02-19 DIAGNOSIS — M10072 Idiopathic gout, left ankle and foot: Secondary | ICD-10-CM

## 2016-02-19 DIAGNOSIS — L97501 Non-pressure chronic ulcer of other part of unspecified foot limited to breakdown of skin: Secondary | ICD-10-CM

## 2016-02-19 DIAGNOSIS — E11621 Type 2 diabetes mellitus with foot ulcer: Secondary | ICD-10-CM

## 2016-02-19 DIAGNOSIS — M109 Gout, unspecified: Secondary | ICD-10-CM

## 2016-02-19 DIAGNOSIS — N760 Acute vaginitis: Secondary | ICD-10-CM | POA: Insufficient documentation

## 2016-02-19 LAB — BASIC METABOLIC PANEL
BUN: 6 mg/dL — ABNORMAL LOW (ref 7–25)
CALCIUM: 8.6 mg/dL (ref 8.6–10.4)
CHLORIDE: 88 mmol/L — AB (ref 98–110)
CO2: 24 mmol/L (ref 20–31)
Creat: 0.8 mg/dL (ref 0.50–1.05)
GLUCOSE: 458 mg/dL — AB (ref 65–99)
Potassium: 3.7 mmol/L (ref 3.5–5.3)
SODIUM: 127 mmol/L — AB (ref 135–146)

## 2016-02-19 LAB — CBC WITH DIFFERENTIAL/PLATELET
Basophils Absolute: 0 10*3/uL (ref 0.0–0.1)
Basophils Relative: 0 % (ref 0–1)
EOS ABS: 0 10*3/uL (ref 0.0–0.7)
EOS PCT: 0 % (ref 0–5)
HEMATOCRIT: 36.6 % (ref 36.0–46.0)
Hemoglobin: 12 g/dL (ref 12.0–15.0)
LYMPHS ABS: 1.9 10*3/uL (ref 0.7–4.0)
LYMPHS PCT: 17 % (ref 12–46)
MCH: 29.9 pg (ref 26.0–34.0)
MCHC: 32.8 g/dL (ref 30.0–36.0)
MCV: 91.3 fL (ref 78.0–100.0)
MONOS PCT: 5 % (ref 3–12)
MPV: 10.8 fL (ref 8.6–12.4)
Monocytes Absolute: 0.6 10*3/uL (ref 0.1–1.0)
NEUTROS PCT: 78 % — AB (ref 43–77)
Neutro Abs: 8.9 10*3/uL — ABNORMAL HIGH (ref 1.7–7.7)
PLATELETS: 258 10*3/uL (ref 150–400)
RBC: 4.01 MIL/uL (ref 3.87–5.11)
RDW: 13.5 % (ref 11.5–15.5)
WBC: 11.4 10*3/uL — ABNORMAL HIGH (ref 4.0–10.5)

## 2016-02-19 LAB — URIC ACID: Uric Acid, Serum: 5.8 mg/dL (ref 2.4–7.0)

## 2016-02-19 LAB — C-REACTIVE PROTEIN: CRP: 17.4 mg/dL — ABNORMAL HIGH (ref <0.60)

## 2016-02-19 MED ORDER — CEPHALEXIN 500 MG PO CAPS: 500.0000 mg | ORAL_CAPSULE | Freq: Four times a day (QID) | ORAL | Status: DC

## 2016-02-19 MED ORDER — COLCHICINE 0.6 MG PO TABS: 0.6000 mg | ORAL_TABLET | Freq: Every day | ORAL | Status: DC

## 2016-02-19 NOTE — Telephone Encounter (Addendum)
Pt states she would like blood labs' paperwork to go to another doctor's office to be drawn. Printed and given to pt.  Pt states the pain is in her right foot, but is wearing the boot on her left.

## 2016-02-19 NOTE — Progress Notes (Signed)
Subjective:     Patient ID: [name], female   DOB: 11-18-1966, 50 y.o.   MRN: [id number]  HPI this patient returns to the office for reevaluation and care of a diabetic ulcer under the big toe of the right foot. He is diabetic and is neurotropic she has been applying the helix 3 and bandaging. To the ulcer as I described.  The ulcer continues to be open despite her home care and wearing surgical shoe. She also relates that she has developed severe pain noted in her left ankle. She denies any trauma or reinjury to the left ankle. She states that she developed the pain approximately 3 days ago and the area has become painful and swollen since that time. She relates that there is a red, inflamed area on the outside of her left ankle. She presents the office for an evaluation and treatment of both these conditions   Review of Systems     Objective:   Physical Exam     Physical Exam GENERAL APPEARANCE: Alert, conversant. Appropriately groomed. No acute distress.  VASCULAR: Pedal pulses palpable at [facility] and PT right. DP and PT non palpable left foot.. Capillary refill time is immediate to all digits, Normal temperature gradient. Digital hair growth is present bilateral  NEUROLOGIC: sensation is absent to 5.07 monofilament at 5/5 sites bilateral. Light touch is intact bilateral, Muscle strength normal.  MUSCULOSKELETAL: acceptable muscle strength, tone and stability bilateral. Intrinsic muscluature intact bilateral. Rectus appearance of foot and digits noted bilateral. Swollen left ankle with swelling in left leg.  Redness noted over lateral malleolus.  Palpable pain anterior to left ankle.Limited ROM left ankle.   DERMATOLOGIC: skin color, texture, and turgor are within normal limits. She has healing diabetic ulcer under the ball of her right foot. Her ulcer measures 15 mm. X 15 mm. mm. No malodor. Healthy granulation tissue at site of ulcer. No drainage or infection noted at this             Assessment:     Diabetic Ulcer right foot  Gout left ankle.     Plan:     ROV  Debride ulcer right foot.  Povidine/DSD.  X-rays were taken left ankle.  No bony pathology noted.   Conferred with Dr. Jacqualyn Posey and we suspect she is having a gout-like attack.  Therefore we ordered and prescribed  Colchicine. Prescribed cephalexin to  decrease redness left ankle  . in case there is an infection.  Ordered bloodwork CBC with diff, arthritis profile and chemistry .  RTC 2 weeks for ulcer treatment call if ankle pain persists.   Gardiner Barefoot DPM

## 2016-02-20 ENCOUNTER — Telehealth: Payer: Self-pay | Admitting: Sports Medicine

## 2016-02-20 LAB — SEDIMENTATION RATE: Sed Rate: 55 mm/hr — ABNORMAL HIGH (ref 0–20)

## 2016-02-20 NOTE — Telephone Encounter (Signed)
Called patient in ref to Critical lab value; [facility] lab Glucose of 458; Patient reports that she is on insulin and her blood sugars run "high"; Advised patient to seek Emergency care if not brought down/controlled with insulin. Patient expressed understanding. -Dr. Cannon Kettle

## 2016-02-29 ENCOUNTER — Other Ambulatory Visit (INDEPENDENT_AMBULATORY_CARE_PROVIDER_SITE_OTHER): Payer: Medicare Other

## 2016-02-29 ENCOUNTER — Telehealth: Payer: Self-pay | Admitting: *Deleted

## 2016-02-29 DIAGNOSIS — E1165 Type 2 diabetes mellitus with hyperglycemia: Secondary | ICD-10-CM

## 2016-02-29 DIAGNOSIS — Z794 Long term (current) use of insulin: Secondary | ICD-10-CM

## 2016-02-29 LAB — BASIC METABOLIC PANEL
BUN: 13 mg/dL (ref 6–23)
CHLORIDE: 90 meq/L — AB (ref 96–112)
CO2: 23 mEq/L (ref 19–32)
Calcium: 8.8 mg/dL (ref 8.4–10.5)
Creatinine, Ser: 0.93 mg/dL (ref 0.40–1.20)
GFR: 67.62 mL/min (ref >60.00)
Glucose, Bld: 504 mg/dL (ref 70–99)
POTASSIUM: 3.8 meq/L (ref 3.5–5.1)
Sodium: 124 mEq/L — ABNORMAL LOW (ref 135–145)

## 2016-02-29 LAB — HEMOGLOBIN A1C: HEMOGLOBIN A1C: 12.3 % — AB (ref 4.6–6.5)

## 2016-02-29 NOTE — Telephone Encounter (Signed)
I called patient to let her know her glucose was 504 in the lab today, she checked her sugar while I was on the phone with her and it just said HIGH on her meter. Please advise

## 2016-02-29 NOTE — Telephone Encounter (Signed)
Is she taking her insulin twice a day?,  Supposed to be taking 30 units before breakfast and 20 at suppertime.  Need to increase her doses by 10 units if taking it regularly Make sure she is taking Invokana

## 2016-02-29 NOTE — Telephone Encounter (Signed)
Noted, patient is aware, she had only been taking her Insulin once a day, advised her to make sure she starts taking it twice a day, she agreed. She is taking her Invokana.

## 2016-03-03 ENCOUNTER — Ambulatory Visit (INDEPENDENT_AMBULATORY_CARE_PROVIDER_SITE_OTHER): Payer: Medicare Other | Admitting: Endocrinology

## 2016-03-03 ENCOUNTER — Other Ambulatory Visit: Payer: Self-pay | Admitting: *Deleted

## 2016-03-03 ENCOUNTER — Encounter: Payer: Self-pay | Admitting: Endocrinology

## 2016-03-03 VITALS — BP 122/82 | HR 84 | Temp 97.3°F | Resp 16 | Ht 62.0 in | Wt 214.8 lb

## 2016-03-03 DIAGNOSIS — I10 Essential (primary) hypertension: Secondary | ICD-10-CM | POA: Diagnosis not present

## 2016-03-03 DIAGNOSIS — Z794 Long term (current) use of insulin: Secondary | ICD-10-CM | POA: Diagnosis not present

## 2016-03-03 DIAGNOSIS — E1165 Type 2 diabetes mellitus with hyperglycemia: Secondary | ICD-10-CM | POA: Diagnosis not present

## 2016-03-03 MED ORDER — INSULIN ASPART PROT & ASPART (70-30 MIX) 100 UNIT/ML PEN: PEN_INJECTOR | SUBCUTANEOUS | Status: DC

## 2016-03-03 NOTE — Progress Notes (Signed)
Patient ID: [name], female   DOB: 1966-06-05, 50 y.o.   MRN: [id number]   Reason for Appointment: Diabetes follow-up    History of Present Illness:   Diagnosis: Type 2 diabetes mellitus, date of diagnosis: 2004.  PAST history: She has been treated mostly with insulin since about a year after diagnosis. She has had difficulty with consistent compliance with diet and also compliance with self care including glucose monitoring over the years. She had been mostly treated with basal insulin. Also had been tried on mealtime insulin but she would be noncompliant with this. Was tried on Prandin for mealtime control but difficult to judge efficacy because of lack of postprandial monitoring. Was given Victoza to start in 2011 but did not follow up after this.She was tried on premixed insulin but this did not help her control, mostly because of noncompliance with the doses. She had been using the V- go pump and had better control initially and was better compliant with the daily routine and boluses. She has had frequent education visits also. She stopped using her V.-go pump in 6/15 because of discomfort at the site of application   RECENT history:    INSULIN dose:  Novolog Mix 30 units qd,  not taking 20 in pm    Her A1c previously has been consistently under 7% and more recently blood sugars had been progressively worse A1c has increased sharply to 12% Also did not improve with a trial of Victoza On her visit in 12/16 she was told to stop Lantus and start Novolog Mix insulin twice a day for better control and control of postprandial readings  Current management and problems identified:  She has not checked her blood sugars and does not know what they are  She thinks her high sugars recently have been from drinking a lot of [facility], glucose was over 500 in the lab  Despite instructions to take insulin before supper time also she has not done so until she was guarded about  her high blood sugar  Currently is trying to drink more water and trying to avoid drinks which sugar  Not able to exercise because of foot pain and also a new ulcer  Glucometer:  FreeStyle.  Checking blood sugar very sporadically and did not bring her monitor for review as before Blood Glucose readings:  none  Hypoglycemia frequency: Never.    Food preferences: eating 1 or 2 meals per day, variable intake, sandwiches at times or otherwise snacks, dinner usually 8 PM  Physical activity: exercise: Minimal.  She has difficulties with leg pain Certified Diabetes Educator visit: Most recent:, 2/14.   The diet that the patient has been following is no specific diet; still eating at irregular times; still drinking drinks with sugar claiming she is allergic to artificial sweeteners   Wt Readings from Last 3 Encounters:  03/03/16 214 lb 12.8 oz (97.433 kg)  01/06/16 215 lb 12.8 oz (97.886 kg)  12/17/15 217 lb 6.4 oz (98.612 kg)   DM labs:  Lab Results  Component Value Date   HGBA1C 12.3* 02/29/2016   HGBA1C 8.4* 01/01/2016   HGBA1C 8.3* 11/09/2015   Lab Results  Component Value Date   MICROALBUR <0.7 08/12/2015   LDLCALC 41 01/01/2016   CREATININE 0.93 02/29/2016    Lab on 02/29/2016  Component Date Value Ref Range Status  . Sodium 02/29/2016 124* 135 - 145 mEq/L Final  . Potassium 02/29/2016 3.8  3.5 - 5.1 mEq/L Final  .  Chloride 02/29/2016 90* 96 - 112 mEq/L Final  . CO2 02/29/2016 23  19 - 32 mEq/L Final  . Glucose, Bld 02/29/2016 504* 70 - 99 mg/dL Final  . BUN 02/29/2016 13  6 - 23 mg/dL Final  . Creatinine, Ser 02/29/2016 0.93  0.40 - 1.20 mg/dL Final  . Calcium 02/29/2016 8.8  8.4 - 10.5 mg/dL Final  . GFR 02/29/2016 67.62  >60.00 mL/min Final  . Hgb A1c MFr Bld 02/29/2016 12.3* 4.6 - 6.5 % Final   Glycemic Control Guidelines for People with Diabetes:Non Diabetic:  <6%Goal of Therapy: <7%Additional Action Suggested:  [id number]     Complications: are: peripheral neuropathy  and she is still fairly symptomatic with this.       Medication List       This list is accurate as of: 03/03/16  2:25 PM.  Always use your most recent med list.               amphetamine-dextroamphetamine 30 MG tablet  Commonly known as:  ADDERALL  Take 30 mg by mouth 2 (two) times daily.     atorvastatin 40 MG tablet  Commonly known as:  LIPITOR  Take 1 tablet (40 mg total) by mouth daily at 6 PM.     canagliflozin 300 MG Tabs tablet  Commonly known as:  INVOKANA  Take 300 mg by mouth daily before breakfast.     cephALEXin 500 MG capsule  Commonly known as:  KEFLEX  Take 1 capsule (500 mg total) by mouth 4 (four) times daily.     CHANTIX 0.5 MG tablet  Generic drug:  varenicline  Take 1 mg by mouth daily.     chlorproMAZINE 200 MG tablet  Commonly known as:  THORAZINE  Take 200 mg by mouth at bedtime.     ciprofloxacin 500 MG tablet  Commonly known as:  CIPRO     colchicine 0.6 MG tablet  Take 1 tablet (0.6 mg total) by mouth daily.     cyclobenzaprine 10 MG tablet  Commonly known as:  FLEXERIL  Take 10 mg by mouth at bedtime as needed for muscle spasms.     diazepam 5 MG tablet  Commonly known as:  VALIUM  Take 5-10 mg by mouth at bedtime as needed for anxiety or muscle spasms.     diazepam 10 MG tablet  Commonly known as:  VALIUM     esomeprazole 40 MG capsule  Commonly known as:  NEXIUM  Take 40 mg by mouth daily.     fenofibrate 145 MG tablet  Commonly known as:  TRICOR  TAKE 1 TABLET (145 MG TOTAL) BY MOUTH DAILY.     fluconazole 150 MG tablet  Commonly known as:  DIFLUCAN  Take 1 tablet (150 mg total) by mouth once.     freestyle lancets  Use as instructed to check blood sugar 3 times per day dx code E11.65     furosemide 20 MG tablet  Commonly known as:  LASIX  Take 1 tablet by mouth as needed     glucose blood test strip  Commonly known as:  FREESTYLE LITE  Use as instructed to check blood sugar 3 times per day dx code E11.65      insulin aspart protamine - aspart (70-30) 100 UNIT/ML FlexPen  Commonly known as:  NOVOLOG MIX 70/30 FLEXPEN  Inject 30 units in the morning and 20 units in the evening.     ketoconazole 2 % cream  Commonly known as:  NIZORAL     LANTUS SOLOSTAR 100 UNIT/ML Solostar Pen  Generic drug:  Insulin Glargine  INJECT 46 UNITS DAILY AT THE SAME TIME EVERY DAY. IF SUGAR GOES ABOVE 200 INCREASE DOSE TO 50     oxyCODONE 5 MG immediate release tablet  Commonly known as:  Oxy IR/ROXICODONE  Take 5 mg by mouth 2 (two) times daily as needed for moderate pain or severe pain.     oxyCODONE-acetaminophen 10-325 MG tablet  Commonly known as:  PERCOCET  Take 1 tablet by mouth 3 (three) times daily as needed for pain.     phentermine 37.5 MG tablet  Commonly known as:  ADIPEX-P  Take 37.5 mg by mouth daily as needed (appetite control).     potassium chloride SA 20 MEQ tablet  Commonly known as:  K-DUR,KLOR-CON  Take 1 tablet (20 mEq total) by mouth daily.     rizatriptan 10 MG tablet  Commonly known as:  MAXALT  Take 1 tablet (10 mg total) by mouth as needed for migraine. May repeat in 2 hours if needed     spironolactone 100 MG tablet  Commonly known as:  ALDACTONE  Take 1 tablet (100 mg total) by mouth daily.     sulfamethoxazole-trimethoprim 800-160 MG tablet  Commonly known as:  BACTRIM DS,SEPTRA DS     tamsulosin 0.4 MG Caps capsule  Commonly known as:  FLOMAX  Take 0.4 mg by mouth daily.     TRULICITY [id number] Sopn  Generic drug:  Dulaglutide     Vortioxetine HBr 5 MG Tabs  Take 5 mg by mouth daily.     TRINTELLIX 20 MG Tabs  Generic drug:  Vortioxetine HBr  Take 20 mg by mouth daily.        Allergies:  Allergies  Allergen Reactions  . Latex Itching, Rash and Other (See Comments)    Pt states she cannot use condoms - cause an infection.  Use of latex on skin is okay.  Tape causes rash  . Ibuprofen Other (See Comments)    headaches  . Sweetening Enhancer [Flavoring  Agent] Nausea And Vomiting    And headaches  . Tetracyclines & Related Nausea And Vomiting    Yeast infections  . Trazodone And Nefazodone Other (See Comments)    Hallucinations   . Triazolam Other (See Comments)    hallucinations  . Aspartame And Phenylalanine Nausea And Vomiting    headaches    Past Medical History  Diagnosis Date  . Hypertension   . Hyperlipemia   . GERD (gastroesophageal reflux disease)   . Insomnia   . SUI (stress urinary incontinence, female) S/P SLING 12-29-2011  . SOB (shortness of breath) on exertion   . Bipolar disorder ([place])   . Neurogenic bladder   . Hypercholesterolemia   . Gastroparesis   . Ulcer   . MRSA (methicillin resistant Staphylococcus aureus)   . Chronic pain   . Edema   . Chest pain     a. 2008 Cath: normal cors;  b. 12/2013 Lexi CL: EF 47%, no ischemia/infarct.  . Anxiety   . Vertigo   . COPD (chronic obstructive pulmonary disease) ([place])   . Cholecystitis   . Hypersomnia   . Tobacco abuse   . Claudication ([place])     a. 12/2013 ABI's: R 0.97, L 0.94.  [name] Palpitations   . Anginal pain ([place])   . Pneumonia     "twice, I think" (02/12/2014)  . Sleep apnea  didn't tolerate cpap (02/12/2014)  . Type II diabetes mellitus ([place])   . History of blood transfusion 1986    "related to lost a child" (02/12/2014)  . H/O hiatal hernia   . [id number])     "weekly" (02/12/2014)  . Stroke ([place]) 02/12/2014    "eyesight is messed up; not steady on my feet" (02/12/2014)  . Arthritis     "qwhere" (02/12/2014)  . DJD (degenerative joint disease)   . Chronic back pain   . Depression   . Personality disorder   . OSA (obstructive sleep apnea) 07/04/2014    Past Surgical History  Procedure Laterality Date  . Knee arthroscopy w/ allograft impant Left     graft x 2  . Foot surgery Bilateral   . Anterior cervical decomp/discectomy fusion  2000    C6 - 7  . Pubovaginal sling  12/29/2011    Procedure: Gaynelle Arabian;  Surgeon: Reece Packer, MD;  Location[facility];  Service: Urology;  Laterality: N/A;  cysto and sparc sling   . Lumbar fusion      "cage in my spine"  . Total abdominal hysterectomy w/ bilateral salpingoophorectomy  1997  . Multiple laparoscopies for endometriosis    . Cesarean section  1989; 1992  . Repeat reconstruction acl left knee/ screws removed  03-28-2000    CADAVER GRAFT  . 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 Knee surgery      TOTAL 8 SURG'S  . Cystoscopy with injection  05/04/2012    Procedure: CYSTOSCOPY WITH INJECTION;  Surgeon: Reece Packer, MD;  Location[facility];  Service: Urology;  Laterality: N/A;  MACROPLASTIQUE INJECTION  . Cystoscopy with injection  08/28/2012    Procedure: CYSTOSCOPY WITH INJECTION;  Surgeon: Reece Packer, MD;  Location: [facility];  Service: Urology;  Laterality: N/A;  cysto and macroplastique   . Carpal tunnel release Right 04-25-2013  . Cysto N/A 04/30/2013    Procedure: CYSTOSCOPY;  Surgeon: Reece Packer, MD;  Location: [facility];  Service: Urology;  Laterality: N/A;  . Pubovaginal sling N/A 04/30/2013    Procedure: REMOVAL OF VAGINAL MESH;  Surgeon: Reece Packer, MD;  Location: [facility];  Service: Urology;  Laterality: N/A;  . Abdominal hysterectomy    . Hernia repair  ?1996    "stomach"  . Cardiac catheterization  05-22-2008   DR SKAINS    NO SIGNIFECANT CAD/ NORMAL LV/  EF 65%/  NO WALL MOTION ABNORMALITIES  . Cardiac catheterization  02/12/2014  . Left heart catheterization with coronary angiogram N/A 02/12/2014    Procedure: LEFT HEART CATHETERIZATION WITH CORONARY ANGIOGRAM;  Surgeon: Candee Furbish, MD;  Location: [facility] CATH LAB;  Service: Cardiovascular;  Laterality: N/A;  . Laparoscopic sigmoid colectomy  04/22/2015  . Laparoscopic sigmoid colectomy N/A 04/22/2015    Procedure: LAPAROSCOPIC HAND ASSISTED SIGMOID COLECTOMY;  Surgeon: Erroll Luna, MD;  Location: [facility];   Service: General;  Laterality: N/A;    Family History  Problem Relation Age of Onset  . Hypertension Mother   . Diabetes Mother   . Cancer - Other Mother     lymphoma   . Cancer - Other Father     lung, bladder cancer   . Heart attack Father   . Cancer - Other Brother     bladder cancer     Social History:  reports that she has been smoking Cigarettes and E-cigarettes.  She has a 41  pack-year smoking history. She has never used smokeless tobacco. She reports that she drinks alcohol. She reports that she does not use illicit drugs.  Review of Systems -   She is being treated by the podiatrist for persistent foot ulcer  She also had ankle pain and foot pain and she was given colchicine.  However her uric acid was normal  She has history of high triglycerides treated with fenofibrate   Currently she is taking Lipitor for high LDL  Lab Results  Component Value Date   CHOL 125 01/01/2016   HDL 47.10 01/01/2016   LDLCALC 41 01/01/2016   LDLDIRECT 57.0 03/09/2015   TRIG 187.0* 01/01/2016   CHOLHDL 3 01/01/2016    Hypertension: This has been relatively mild and controlled . She is Recently not taking spironolactone, she decided to stop all her medications on her own   Painful neuropathy: She has had continued symptoms including leg pains and numbness as well as difficulty with balance and has tried various drugs including gabapentin without much relief  Foot exam shows absent pedal pulses and distal sensation   Examination:   BP 122/82 mmHg  Pulse 84  Temp(Src) 97.3 F (36.3 C)  Resp 16  Ht 5\' 2"  (1.575 m)  Wt 214 lb 12.8 oz (97.433 kg)  BMI 39.28 kg/m2  SpO2 97%  Body mass index is 39.28 kg/(m^2).   No edema present.   Assesment/Plan:   1. Diabetes type 2, with recently very poor control   The patient reports noncompliance with her evening insulin as well as with diet and avoiding drinks which sugar Not clear why her blood sugars are the highest level in the  long time She has been unmotivated to check her blood sugar also Most likely she had been drinking more [facility] and this caused the vicious cycle of high sugars  Recommendations:  To take insulin at suppertime consistently  Regular glucose monitoring  Discussed doing the V-go pump again and she could use her arm but she refuses to do this  Continue Invokana  Continue avoiding drinks which sugar consistently  Advised her to call if blood sugars are consistently high over 200, discussed blood sugar targets morning and postprandial  2. Hypertension: Blood pressure is  normal with not taking Aldactone at this time, may consider restarting this especially if her potassium is low on the next visit  3.  Continue follow-up with podiatrist for foot ulcer, discussed importance of blood sugar control to prevent worsening of infection   Patient Instructions  Check blood sugars on waking up 3-4  times a week Also check blood sugars about 2 hours after a meal and do this after different meals by rotation  Recommended blood sugar levels on waking up is 90-130 and about 2 hours after meal is 130-160  Please bring your blood sugar monitor to each visit, thank you  Take insulin twice daily as directed      Counseling time on subjects discussed above is over 50% of today's 25 minute visit   Gurpreet Mariani 03/03/2016, 2:25 PM

## 2016-03-03 NOTE — Patient Instructions (Signed)
Check blood sugars on waking up 3-4  times a week Also check blood sugars about 2 hours after a meal and do this after different meals by rotation  Recommended blood sugar levels on waking up is 90-130 and about 2 hours after meal is 130-160  Please bring your blood sugar monitor to each visit, thank you  Take insulin twice daily as directed

## 2016-03-07 ENCOUNTER — Telehealth: Payer: Self-pay | Admitting: *Deleted

## 2016-03-07 NOTE — Telephone Encounter (Signed)
Pt states both of her feet are swollen and the one with the hole is painful to the ankle.  I spoke with the pt and told her to keep her appt for tomorrow.  Pt agreed.

## 2016-03-08 ENCOUNTER — Encounter: Payer: Self-pay | Admitting: Podiatry

## 2016-03-08 ENCOUNTER — Ambulatory Visit (INDEPENDENT_AMBULATORY_CARE_PROVIDER_SITE_OTHER): Payer: Medicare Other | Admitting: Podiatry

## 2016-03-08 VITALS — BP 136/94 | HR 94 | Resp 14

## 2016-03-08 DIAGNOSIS — T148XXA Other injury of unspecified body region, initial encounter: Secondary | ICD-10-CM

## 2016-03-08 DIAGNOSIS — L089 Local infection of the skin and subcutaneous tissue, unspecified: Secondary | ICD-10-CM

## 2016-03-08 DIAGNOSIS — E11621 Type 2 diabetes mellitus with foot ulcer: Secondary | ICD-10-CM

## 2016-03-08 DIAGNOSIS — L97501 Non-pressure chronic ulcer of other part of unspecified foot limited to breakdown of skin: Secondary | ICD-10-CM

## 2016-03-08 DIAGNOSIS — R895 Abnormal microbiological findings in specimens from other organs, systems and tissues: Secondary | ICD-10-CM

## 2016-03-08 DIAGNOSIS — T148 Other injury of unspecified body region: Secondary | ICD-10-CM | POA: Diagnosis not present

## 2016-03-08 DIAGNOSIS — R898 Other abnormal findings in specimens from other organs, systems and tissues: Secondary | ICD-10-CM

## 2016-03-08 MED ORDER — DOXYCYCLINE HYCLATE 100 MG PO TABS: 100.0000 mg | ORAL_TABLET | Freq: Two times a day (BID) | ORAL | Status: DC

## 2016-03-08 MED ORDER — FLUCONAZOLE 150 MG PO TABS: 150.0000 mg | ORAL_TABLET | Freq: Once | ORAL | Status: DC

## 2016-03-08 NOTE — Progress Notes (Signed)
Subjective:     Patient ID: [name], female   DOB: 12/12/1966, 50 y.o.   MRN: [id number]  HPI this patient returns to the office 2 weeks after treatment for her diabetic ulcer right foot and diagnosis of a gout attack left ankle. She was treated with anti-inflammatory medication for her gout and her ankle is normal at today's visit, I received her blood work which revealed her blood sugar to be markedly elevated. She also had a high sedimentation rate as well as C-reactive protein. She presents the office today for continued evaluation and treatment of her diabetic ulcer right foot and she also says she has developed a new blister on the inside of her right foot. She has been wearing her surgical shoe as previously prescribed. She presents the office today for continued evaluation and treatment of her diabetic ulcer. Her gout has resolved.     Review of Systems     Objective:   Physical Exam    Physical Exam GENERAL APPEARANCE: Alert, conversant. Appropriately groomed. No acute distress.  VASCULAR: Pedal pulses palpable at [facility] and PT right. DP and PT non palpable left foot.. Capillary refill time is immediate to all digits, Normal temperature gradient. Digital hair growth is present bilateral  NEUROLOGIC: sensation is absent to 5.07 monofilament at 5/5 sites bilateral. Light touch is intact bilateral, Muscle strength normal.  MUSCULOSKELETAL: acceptable muscle strength, tone and stability bilateral. Intrinsic muscluature intact bilateral.Left ankle redness and swelling has resolved.   DERMATOLOGIC: skin color, texture, and turgor are within normal limits. She has healing diabetic ulcer under the ball of her right foot. Her ulcer measures 15 mm. X 15 mm. mm. No malodor. Healthy granulation tissue at site of ulcer. No drainage or infection noted at this time.  There is a blister at dorsomedial blister right foot.  There is fluctuance noted. In the blister.                   Assessment:     Diabetic ulcer right foot.   Infected ulcer right foot.     Plan:     ROV  Incision and drainage of blister right foot.  Culture and sensitivity taken.  Sent for culture.  Debride diabetic ulcer right foot.  Told patient her ulcer is healing but the fact her blood sugar is 458 is preventing the ulcer from complete healing.  To send a letter to her medical doctor concerning her blood sugar and ulcer healing correlation.   RTC 2 weeks.  Continued home soaks.     Gardiner Barefoot DPM

## 2016-03-12 LAB — WOUND CULTURE
GRAM STAIN: NONE SEEN
GRAM STAIN: NONE SEEN

## 2016-03-22 ENCOUNTER — Encounter: Payer: Self-pay | Admitting: Podiatry

## 2016-03-22 ENCOUNTER — Ambulatory Visit (INDEPENDENT_AMBULATORY_CARE_PROVIDER_SITE_OTHER): Payer: Medicare Other | Admitting: Podiatry

## 2016-03-22 VITALS — BP 114/79 | HR 89 | Resp 14

## 2016-03-22 DIAGNOSIS — L89891 Pressure ulcer of other site, stage 1: Secondary | ICD-10-CM

## 2016-03-22 DIAGNOSIS — E11621 Type 2 diabetes mellitus with foot ulcer: Secondary | ICD-10-CM

## 2016-03-22 DIAGNOSIS — L97501 Non-pressure chronic ulcer of other part of unspecified foot limited to breakdown of skin: Principal | ICD-10-CM

## 2016-03-22 MED ORDER — CEPHALEXIN 500 MG PO CAPS: 500.0000 mg | ORAL_CAPSULE | Freq: Two times a day (BID) | ORAL | Status: DC

## 2016-03-22 NOTE — Progress Notes (Signed)
Subjective:     Patient ID: [name], female   DOB: 22-Dec-1966, 50 y.o.   MRN: [id number]  HPI this patient presents the office about 10 days after her previous visit. She has been under treatment for diabetic ulcer on the right foot, March 14 she developed an infected blister on the dorsomedial aspect of the right foot. The blister was drained and she was prescribed cephalexin to take at home. She presents the office today stating that she has taken her antibiotics and is running out. She says she has tried to eat and take better care of herself.   Review of Systems     Objective:   Physical Exam GENERAL APPEARANCE: Alert, conversant. Appropriately groomed. No acute distress.  VASCULAR: Pedal pulses are  palpable at  [facility] and PT . Her DP and PT are non palpable left foot..  Capillary refill time is immediate to all digits,  Normal temperature gradient.   NEUROLOGIC: sensation is absent  to 5.07 monofilament at 5/5 sites bilateral.   Muscle strength normal.  MUSCULOSKELETAL: acceptable muscle strength, tone and stability bilateral.  Intrinsic muscluature intact bilateral.  Rectus appearance of foot and digits noted bilateral. No evidence of gout noted.  DERMATOLOGIC: Skin color, texture, and turgor are within normal limits. She has a healing diabetic ulcer under the ball of her right foot. This measures 8 mm x 8 mm, no malodor noted healthy granulation tissue at the site of the ulcer. No drainage or infection noted at this time. The dorsomedial blister on the right foot has healed with no evidence of any redness, swelling or infection. Marked improvement noted on the ulcer right foot      Assessment:    Diabetic Ulcer right foot.     Plan:     ROV  Debride ulcer right foot. Her blister/infection has resolved taking antibiotics.  Continue home soaks.  RTC 2 weeks. Refill cephalexin with instructions to take one bid. Examination of her diabetic ulcer reveals that it has diminished 50% inside  and there is still normal healing granulation tissue Neosporin and dry sterile dressing. Patient to return the office in 2 weeks for evaluation and treatment   Gardiner Barefoot DPM   Gardiner Barefoot DPM

## 2016-03-24 ENCOUNTER — Telehealth: Payer: Self-pay | Admitting: Endocrinology

## 2016-03-24 ENCOUNTER — Encounter: Payer: Self-pay | Admitting: *Deleted

## 2016-03-24 ENCOUNTER — Ambulatory Visit: Payer: Medicare Other | Admitting: Endocrinology

## 2016-03-24 NOTE — Telephone Encounter (Signed)
Patient no showed today's appt. Please advise on how to follow up. °A. No follow up necessary. °B. Follow up urgent. Contact patient immediately. °C. Follow up necessary. Contact patient and schedule visit in ___ days. °D. Follow up advised. Contact patient and schedule visit in ____weeks. ° °

## 2016-03-24 NOTE — Telephone Encounter (Signed)
Rescheduled at the earliest possible time

## 2016-03-24 NOTE — Telephone Encounter (Signed)
Letter mailed

## 2016-03-29 ENCOUNTER — Other Ambulatory Visit (INDEPENDENT_AMBULATORY_CARE_PROVIDER_SITE_OTHER): Payer: Medicare Other

## 2016-03-29 DIAGNOSIS — E1165 Type 2 diabetes mellitus with hyperglycemia: Secondary | ICD-10-CM

## 2016-03-29 DIAGNOSIS — Z794 Long term (current) use of insulin: Secondary | ICD-10-CM | POA: Diagnosis not present

## 2016-03-29 LAB — BASIC METABOLIC PANEL
BUN: 13 mg/dL (ref 6–23)
CO2: 24 mEq/L (ref 19–32)
Calcium: 8.9 mg/dL (ref 8.4–10.5)
Chloride: 96 mEq/L (ref 96–112)
Creatinine, Ser: 0.98 mg/dL (ref 0.40–1.20)
GFR: 63.63 mL/min (ref >60.00)
POTASSIUM: 4.5 meq/L (ref 3.5–5.1)
SODIUM: 130 meq/L — AB (ref 135–145)

## 2016-03-30 ENCOUNTER — Other Ambulatory Visit: Payer: Self-pay | Admitting: Endocrinology

## 2016-03-30 ENCOUNTER — Ambulatory Visit: Payer: Medicare Other | Admitting: Endocrinology

## 2016-03-30 DIAGNOSIS — Z0289 Encounter for other administrative examinations: Secondary | ICD-10-CM

## 2016-03-30 LAB — FRUCTOSAMINE: Fructosamine: 316 umol/L — ABNORMAL HIGH (ref 0–285)

## 2016-03-30 NOTE — Telephone Encounter (Signed)
She is requesting a refill of Furosemide, is this okay to refill?

## 2016-03-31 ENCOUNTER — Other Ambulatory Visit: Payer: Self-pay | Admitting: Hematology

## 2016-03-31 ENCOUNTER — Other Ambulatory Visit (HOSPITAL_BASED_OUTPATIENT_CLINIC_OR_DEPARTMENT_OTHER): Payer: Medicare Other

## 2016-03-31 DIAGNOSIS — C187 Malignant neoplasm of sigmoid colon: Secondary | ICD-10-CM

## 2016-03-31 DIAGNOSIS — C189 Malignant neoplasm of colon, unspecified: Secondary | ICD-10-CM

## 2016-03-31 LAB — COMPREHENSIVE METABOLIC PANEL
ALT: 27 U/L (ref 0–55)
AST: 25 U/L (ref 5–34)
Albumin: 3.5 g/dL (ref 3.5–5.0)
Alkaline Phosphatase: 82 U/L (ref 40–150)
Anion Gap: 12 mEq/L — ABNORMAL HIGH (ref 3–11)
BILIRUBIN TOTAL: 0.31 mg/dL (ref 0.20–1.20)
BUN: 13.7 mg/dL (ref 7.0–26.0)
CO2: 27 meq/L (ref 22–29)
Calcium: 9.8 mg/dL (ref 8.4–10.4)
Chloride: 95 mEq/L — ABNORMAL LOW (ref 98–109)
Creatinine: 1.3 mg/dL — ABNORMAL HIGH (ref 0.6–1.1)
EGFR: 50 mL/min/{1.73_m2} — AB (ref >90)
GLUCOSE: 434 mg/dL — AB (ref 70–140)
Potassium: 4.6 mEq/L (ref 3.5–5.1)
SODIUM: 134 meq/L — AB (ref 136–145)
TOTAL PROTEIN: 7 g/dL (ref 6.4–8.3)

## 2016-03-31 LAB — CBC WITH DIFFERENTIAL/PLATELET
BASO%: 1 % (ref 0.0–2.0)
Basophils Absolute: 0.1 10*3/uL (ref 0.0–0.1)
EOS ABS: 0 10*3/uL (ref 0.0–0.5)
EOS%: 0.2 % (ref 0.0–7.0)
HEMATOCRIT: 38.3 % (ref 34.8–46.6)
HEMOGLOBIN: 12.9 g/dL (ref 11.6–15.9)
LYMPH#: 3.1 10*3/uL (ref 0.9–3.3)
LYMPH%: 25.6 % (ref 14.0–49.7)
MCH: 30.6 pg (ref 25.1–34.0)
MCHC: 33.6 g/dL (ref 31.5–36.0)
MCV: 91.2 fL (ref 79.5–101.0)
MONO#: 0.5 10*3/uL (ref 0.1–0.9)
MONO%: 4 % (ref 0.0–14.0)
NEUT%: 69.2 % (ref 38.4–76.8)
NEUTROS ABS: 8.3 10*3/uL — AB (ref 1.5–6.5)
Platelets: 217 10*3/uL (ref 145–400)
RBC: 4.2 10*6/uL (ref 3.70–5.45)
RDW: 13.7 % (ref 11.2–14.5)
WBC: 12 10*3/uL — AB (ref 3.9–10.3)

## 2016-04-01 LAB — CEA (PARALLEL TESTING): CEA: 2.9 ng/mL — ABNORMAL HIGH

## 2016-04-01 LAB — CEA: CEA: 3.4 ng/mL (ref 0.0–4.7)

## 2016-04-05 ENCOUNTER — Ambulatory Visit (INDEPENDENT_AMBULATORY_CARE_PROVIDER_SITE_OTHER): Payer: Medicare Other | Admitting: Podiatry

## 2016-04-05 ENCOUNTER — Encounter: Payer: Self-pay | Admitting: Podiatry

## 2016-04-05 DIAGNOSIS — L97501 Non-pressure chronic ulcer of other part of unspecified foot limited to breakdown of skin: Secondary | ICD-10-CM

## 2016-04-05 DIAGNOSIS — E11621 Type 2 diabetes mellitus with foot ulcer: Secondary | ICD-10-CM

## 2016-04-05 NOTE — Progress Notes (Signed)
Subjective:     Patient ID: [name], female   DOB: 05-Apr-1966, 50 y.o.   MRN: [id number]  HPI this patient presents the office about 10 days after her previous visit. She has been under treatment for diabetic ulcer on the right foot. On  March 14 she developed an infected blister on the dorsomedial aspect of the right foot. The blister was drained and she was prescribed cephalexin to take at home. She presents the office today stating that she has taken her antibiotics and is running out.  Her infection has resolved at this time.  She says she has tried to eat and take better care of herself. She appears with skin lesion on fifth toe right foot.   Review of Systems     Objective:   Physical Exam GENERAL APPEARANCE: Alert, conversant. Appropriately groomed. No acute distress.  VASCULAR: Pedal pulses are  palpable at  [facility] and PT right . Her DP and PT are non palpable left foot..  Capillary refill time is immediate to all digits,  Normal temperature gradient.   NEUROLOGIC: sensation is absent  to 5.07 monofilament at 5/5 sites bilateral.   Muscle strength normal.  MUSCULOSKELETAL: acceptable muscle strength, tone and stability bilateral.  Intrinsic muscluature intact bilateral.  Rectus appearance of foot and digits noted bilateral. No evidence of gout noted.  DERMATOLOGIC: Skin color, texture, and turgor are within normal limits. She has a healing diabetic ulcer under the ball of her right foot. This measures 7 x 7 mm. mm, no malodor noted healthy granulation tissue at the site of the ulcer. No drainage or infection noted at this time. The dorsomedial blister on the right foot has healed with no evidence of any redness, swelling or infection. Marked improvement noted on the ulcer right foot.  There is non infected skin lesion on fifth toe which appears to have been caused by trauma for which she is neurotropic.      Assessment:    Diabetic Ulcer right foot.     Plan:     ROV  Debride ulcer  right foot. Her blister/infection has resolved taking antibiotics.  Continue home soaks.  RTC 2 weeks. . Examination of her diabetic ulcer reveals  there is still normal healing granulation tissue Neosporin and dry sterile dressing. Patient to return the office in 2 weeks for evaluation and treatment.  Neosporin/DSD   Gardiner Barefoot DPM

## 2016-04-06 ENCOUNTER — Telehealth: Payer: Self-pay | Admitting: Hematology

## 2016-04-06 ENCOUNTER — Ambulatory Visit (HOSPITAL_BASED_OUTPATIENT_CLINIC_OR_DEPARTMENT_OTHER): Payer: Medicare Other | Admitting: Hematology

## 2016-04-06 ENCOUNTER — Other Ambulatory Visit: Payer: Self-pay | Admitting: *Deleted

## 2016-04-06 ENCOUNTER — Ambulatory Visit: Payer: Medicare Other | Admitting: Endocrinology

## 2016-04-06 VITALS — BP 146/76 | HR 96 | Temp 97.9°F | Resp 18 | Ht 62.0 in | Wt 220.1 lb

## 2016-04-06 DIAGNOSIS — C187 Malignant neoplasm of sigmoid colon: Secondary | ICD-10-CM

## 2016-04-06 DIAGNOSIS — I10 Essential (primary) hypertension: Secondary | ICD-10-CM

## 2016-04-06 DIAGNOSIS — Z720 Tobacco use: Secondary | ICD-10-CM | POA: Diagnosis not present

## 2016-04-06 DIAGNOSIS — E1165 Type 2 diabetes mellitus with hyperglycemia: Secondary | ICD-10-CM

## 2016-04-06 DIAGNOSIS — E1140 Type 2 diabetes mellitus with diabetic neuropathy, unspecified: Secondary | ICD-10-CM

## 2016-04-06 DIAGNOSIS — L989 Disorder of the skin and subcutaneous tissue, unspecified: Secondary | ICD-10-CM | POA: Diagnosis not present

## 2016-04-06 DIAGNOSIS — F329 Major depressive disorder, single episode, unspecified: Secondary | ICD-10-CM

## 2016-04-06 NOTE — Telephone Encounter (Signed)
per pof to sch pt appt-gave pt copy of avs °

## 2016-04-06 NOTE — Progress Notes (Signed)
[place]  [phone] [phone]  Clinic follow up Note   Patient Care Team: Tamsen Roers, MD as PCP - General (Family Medicine) Elayne Snare, MD as Consulting Physician (Endocrinology) Erroll Luna, MD as Consulting Physician (General Surgery) Truitt Merle, MD as Consulting Physician (Hematology) Arta Silence, MD as Consulting Physician (Gastroenterology) 04/06/2016  CHIEF COMPLAINTS:  Follow up stage I colon cancer   Oncology History   Colon cancer   Staging form: Colon and Rectum, AJCC 7th Edition     Pathologic stage from 05/16/2015: Stage I (T1, N0, cM0) - Signed by Truitt Merle, MD on 06/08/2015       Cancer of sigmoid colon ([place])   02/26/2015 Initial Diagnosis Sigmoid Colon cancer   02/26/2015 Procedure Colonoscopy showed internal hemorrhoids, 2 polyps in the transverse and ileocecal valve. There is a ulcerated polypoid lesion in the sigmoid colon which was biopsied.   03/02/2015 Tumor Marker CEA= 2.5   03/06/2015 Imaging CT chest, abdomen and pelvis with contrast showed no evidence of metastatic disease.   04/22/2015 Surgery Sigmoid colon segmental resection, showed grade 2 adenocarcinoma, T1, 1.3 cm, 10 lymph nodes were negative, no lymphovascular invasion, no perineural invasion.     HISTORY OF PRESENTING ILLNESS:  [name] 50 y.o. female with the multiple medical comorbidities, including diabetes, hypertension, depression and stroke in 2015, is here because of recently diagnosed colon cancer.  She has chronic constipation, no nausea, abdominal pain or diarrhea. She denies melena or hematochezia. She was referred to see GI Dr. Paulita Fujita, and underwent a colonoscopy on 02/26/2015. The colonoscopy showed internal hemorrhoids, 2 polyps in the transverse and ileocecal valve. A ulcerated polypoid lesion in the sigmoid colon was seen and biopsied, which showed adenocarcinoma. She was referred to  surgeon Dr. Brantley Stage, and underwent laparoscopic  sigmoid colon segmental resection on 04/22/2015.  She has recovered well from surgery. She was discharged home after surgery. She had ED visit on 05/04/2015 for wound dehiscence after staple removal. She has been changing the dressing every other day at home. She has appointment to see Dr. Brantley Stage this abdomen.  She denies any significant pain, BM is normal. No other new complains. She has diabetic neuropathy in hands and feet, which is stable. She lives alone, does not have much social support. She appears to be depressed with a very flat facial expression, but she denies suicidal ideas.  CURRENT THERAPY: Observation  INTERIM HISTORY  [name] returns for follow-up.  Her diabetes has been poorly controlled lately, she developed a wound in the right foot. She also complains diffuse body ache in this and fatigue. No abdominal pain, bloating, nausea, or change of her bowel habits. Her weight has been stable lately. No other new complaints  MEDICAL HISTORY:  Past Medical History  Diagnosis Date  . Hypertension   . Hyperlipemia   . GERD (gastroesophageal reflux disease)   . Insomnia   . SUI (stress urinary incontinence, female) S/P SLING 12-29-2011  . SOB (shortness of breath) on exertion   . Bipolar disorder ([place])   . Neurogenic bladder   . Hypercholesterolemia   . Gastroparesis   . Ulcer   . MRSA (methicillin resistant Staphylococcus aureus)   . Chronic pain   . Edema   . Chest pain     a. 2008 Cath: normal cors;  b. 12/2013 Lexi CL: EF 47%, no ischemia/infarct.  . Anxiety   . Vertigo   . COPD (chronic obstructive pulmonary disease) ([place])   . Cholecystitis   .  Hypersomnia   . Tobacco abuse   . Claudication ([place])     a. 12/2013 ABI's: R 0.97, L 0.94.  [name] Palpitations   . Anginal pain ([place])   . Pneumonia     "twice, I think" (02/12/2014)  . Sleep apnea     didn't tolerate cpap (02/12/2014)  . Type II diabetes mellitus ([place])   . History of blood transfusion 1986    "related to lost  a child" (02/12/2014)  . H/O hiatal hernia   . ASTMHDQQ(229.7)     "weekly" (02/12/2014)  . Stroke ([place]) 02/12/2014    "eyesight is messed up; not steady on my feet" (02/12/2014)  . Arthritis     "qwhere" (02/12/2014)  . DJD (degenerative joint disease)   . Chronic back pain   . Depression   . Personality disorder   . OSA (obstructive sleep apnea) 07/04/2014    SURGICAL HISTORY: Past Surgical History  Procedure Laterality Date  . Knee arthroscopy w/ allograft impant Left     graft x 2  . Foot surgery Bilateral   . Anterior cervical decomp/discectomy fusion  2000    C6 - 7  . Pubovaginal sling  12/29/2011    Procedure: Gaynelle Arabian;  Surgeon: Reece Packer, MD;  Location: [facility];  Service: Urology;  Laterality: N/A;  cysto and sparc sling   . Lumbar fusion      "cage in my spine"  . Total abdominal hysterectomy w/ bilateral salpingoophorectomy  1997  . Multiple laparoscopies for endometriosis    . Cesarean section  1989; 1992  . Repeat reconstruction acl left knee/ screws removed  03-28-2000    CADAVER GRAFT  . 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 Knee surgery      TOTAL 8 SURG'S  . Cystoscopy with injection  05/04/2012    Procedure: CYSTOSCOPY WITH INJECTION;  Surgeon: Reece Packer, MD;  Location: [facility];  Service: Urology;  Laterality: N/A;  MACROPLASTIQUE INJECTION  . Cystoscopy with injection  08/28/2012    Procedure: CYSTOSCOPY WITH INJECTION;  Surgeon: Reece Packer, MD;  Location: [facility];  Service: Urology;  Laterality: N/A;  cysto and macroplastique   . Carpal tunnel release Right 04-25-2013  . Cysto N/A 04/30/2013    Procedure: CYSTOSCOPY;  Surgeon: Reece Packer, MD;  Location: [facility];  Service: Urology;  Laterality: N/A;  . Pubovaginal sling N/A 04/30/2013    Procedure: REMOVAL OF VAGINAL MESH;  Surgeon: Reece Packer, MD;  Location: [facility];  Service: Urology;  Laterality:  N/A;  . Abdominal hysterectomy    . Hernia repair  ?1996    "stomach"  . Cardiac catheterization  05-22-2008   DR SKAINS    NO SIGNIFECANT CAD/ NORMAL LV/  EF 65%/  NO WALL MOTION ABNORMALITIES  . Cardiac catheterization  02/12/2014  . Left heart catheterization with coronary angiogram N/A 02/12/2014    Procedure: LEFT HEART CATHETERIZATION WITH CORONARY ANGIOGRAM;  Surgeon: Candee Furbish, MD;  Location[facility];  Service: Cardiovascular;  Laterality: N/A;  . Laparoscopic sigmoid colectomy  04/22/2015  . Laparoscopic sigmoid colectomy N/A 04/22/2015    Procedure: LAPAROSCOPIC HAND ASSISTED SIGMOID COLECTOMY;  Surgeon: Erroll Luna, MD;  Location: [place] [facility];  Service: General;  Laterality: N/A;    SOCIAL HISTORY: Social History   Social History  . Marital Status: Divorced    Spouse Name: N/A  . Number of Children: 2  . Years of Education:  6th   Occupational History  . Not on file.   Social History Main Topics  . Smoking status: Current Every Day Smoker -- 1.00 packs/day for 41 years    Types: Cigarettes, E-cigarettes  . Smokeless tobacco: Never Used  . Alcohol Use: 0.0 oz/week     Comment: 02/12/2014 "might have a mixed drink on special occasions"  . Drug Use: No  . Sexual Activity: Yes   Other Topics Concern  . Not on file   Social History Narrative  Patient is divorced and lives alone- independent in ADLs, Drives   Patient has two adult children- grown son and daughter who is special needs in a home   Patient is disabled since 64   Patient has a 6th grade education.   Patient is right-handed.   Depression-medication and therapist   Patient drinks 2- 3 liters of soda and drinks tea but not everyday.    Does not routinely exercise.    FAMILY HISTORY: Family History  Problem Relation Age of Onset  . Hypertension Mother   . Diabetes Mother   . Cancer - Other Mother     lymphoma   . Cancer - Other Father     lung, bladder cancer   . Heart attack Father   . Cancer - Other Brother     bladder cancer     ALLERGIES:  is allergic to latex; ibuprofen; sweetening enhancer; tetracyclines & related; trazodone and nefazodone; triazolam; and aspartame and phenylalanine.  MEDICATIONS:  Current Outpatient Prescriptions  Medication Sig Dispense Refill  . amphetamine-dextroamphetamine (ADDERALL) 30 MG tablet Take 30 mg by mouth 2 (two) times daily.   0  . atorvastatin (LIPITOR) 40 MG tablet Take 1 tablet (40 mg total) by mouth daily at 6 PM. 90 tablet 3  . canagliflozin (INVOKANA) 300 MG TABS tablet Take 300 mg by mouth daily before breakfast. 30 tablet 3  . cephALEXin (KEFLEX) 500 MG capsule Take 1 capsule (500 mg total) by mouth 2 (two) times daily. 20 capsule 0  . CHANTIX 0.5 MG tablet Take 1 mg by mouth daily.     . chlorproMAZINE (THORAZINE) 200 MG tablet Take 200 mg by mouth at bedtime.    . ciprofloxacin (CIPRO) 500 MG tablet     . colchicine 0.6 MG tablet Take 1 tablet (0.6 mg total) by mouth daily. (Patient taking differently: Take 0.6 mg by mouth daily. Until N&V) 15 tablet 0  . cyclobenzaprine (FLEXERIL) 10 MG tablet Take 10 mg by mouth at bedtime as needed for muscle spasms.   0  . diazepam (VALIUM) 10 MG tablet     . diazepam (VALIUM) 5 MG tablet Take 5-10 mg by mouth at bedtime as  needed for anxiety or muscle spasms.   1  . doxycycline (VIBRA-TABS) 100 MG tablet Take 1 tablet (100 mg total) by mouth 2 (two) times daily. 20 tablet 0  . esomeprazole (NEXIUM) 40 MG capsule Take 40 mg by mouth daily.     . fenofibrate (TRICOR) 145 MG tablet TAKE 1 TABLET (145 MG TOTAL) BY MOUTH DAILY. 30 tablet 5  . fluconazole (DIFLUCAN) 150 MG tablet Take 1 tablet (150 mg total) by mouth once. 1 tablet 0  . furosemide (LASIX) 20 MG tablet TAKE 1 TABLET BY MOUTH AS NEEDED 15 tablet 0  . glucose blood (FREESTYLE LITE) test strip Use as instructed to check blood sugar 3 times per day dx code E11.65 100 each 5  . insulin aspart protamine - aspart (NOVOLOG MIX 70/30 FLEXPEN) (70-30) 100 UNIT/ML FlexPen Inject 30 units in the morning and 20 units in the evening. 15 mL 5  . ketoconazole (NIZORAL) 2 % cream     . Lancets (FREESTYLE) lancets Use as instructed to check blood sugar 3 times per day dx code E11.65 100 each 5  . LANTUS SOLOSTAR 100 UNIT/ML Solostar Pen INJECT 46 UNITS DAILY AT THE SAME TIME EVERY DAY. IF SUGAR GOES ABOVE 200 INCREASE DOSE TO 50 45 mL 2  . oxyCODONE (OXY  IR/ROXICODONE) 5 MG immediate release tablet Take 5 mg by mouth 2 (two) times daily as needed for moderate pain or severe pain.     [name] oxyCODONE-acetaminophen (PERCOCET) 10-325 MG per tablet Take 1 tablet by mouth 3 (three) times daily as needed for pain.    . phentermine (ADIPEX-P) 37.5 MG tablet Take 37.5 mg by mouth daily as needed (appetite control).   0  . potassium chloride SA (K-DUR,KLOR-CON) 20 MEQ tablet Take 1 tablet (20 mEq total) by mouth daily. 30 tablet 0  . rizatriptan (MAXALT) 10 MG tablet Take 1 tablet (10 mg total) by mouth as needed for migraine. May repeat in 2 hours if needed 10 tablet 3  . spironolactone (ALDACTONE) 100 MG tablet Take 1 tablet (100 mg total) by mouth daily. 30 tablet 5  . sulfamethoxazole-trimethoprim (BACTRIM DS,SEPTRA DS) 800-160 MG tablet     . tamsulosin (FLOMAX) 0.4 MG CAPS capsule  Take 0.4 mg by mouth daily.    . TRULICITY 4.16 LA/4.5XM SOPN     . Vortioxetine HBr (TRINTELLIX) 20 MG TABS Take 20 mg by mouth daily.     . Vortioxetine HBr 5 MG TABS Take 5 mg by mouth daily.     No current facility-administered medications for this visit.    REVIEW OF SYSTEMS:   Constitutional: Denies fevers, chills or abnormal night sweats Eyes: Denies blurriness of vision, double vision or watery eyes Ears, nose, mouth, throat, and face: Denies mucositis or sore throat Respiratory: Denies cough, dyspnea or wheezes Cardiovascular: Denies palpitation, chest discomfort or lower extremity swelling Gastrointestinal:  Denies nausea, heartburn or change in bowel habits Skin: Denies abnormal skin rashes Lymphatics: Denies new lymphadenopathy or easy bruising Neurological:Denies numbness, tingling or new weaknesses Behavioral/Psych: Mood is stable, no new changes  All other systems were reviewed with the patient and are negative.  PHYSICAL EXAMINATION: ECOG PERFORMANCE STATUS: 1 - Symptomatic but completely ambulatory  Filed Vitals:   04/06/16 1533  BP: 146/76  Pulse: 96  Temp: 97.9 F (36.6 C)  Resp: 18   Filed Weights   04/06/16 1533  Weight: 220 lb 1.6 oz (99.837 kg)    GENERAL:alert, no distress and comfortable SKIN: skin color, texture, turgor are normal, no rashes or significant lesions except a 1.5cm open wound at the bottom of right foot.  EYES: normal, conjunctiva are pink and non-injected, sclera clear OROPHARYNX:no exudate, no erythema and lips, buccal mucosa, and tongue normal  NECK: supple, thyroid normal size, non-tender, without nodularity LYMPH:  no palpable lymphadenopathy in the cervical, axillary or inguinal LUNGS: clear to auscultation and percussion with normal breathing effort HEART: regular rate & rhythm and no murmurs and no lower extremity edema ABDOMEN:abdomen soft, no abdomen midline surgical wound is covered by gauze. Non-tender and normal bowel  sounds Musculoskeletal:no cyanosis of digits and no clubbing  PSYCH: alert & oriented x 3 with fluent speech NEURO: no focal motor/sensory deficits  LABORATORY DATA:  I have reviewed the data as listed CBC Latest Ref Rng 03/31/2016 02/19/2016 12/01/2015  WBC 3.9 - 10.3 10e3/uL 12.0(H) 11.4(H) 8.6  Hemoglobin 11.6 - 15.9 g/dL 12.9 12.0 12.1  Hematocrit 34.8 - 46.6 % 38.3 36.6 36.4  Platelets 145 - 400 10e3/uL 217 258 248    CMP Latest Ref Rng 03/31/2016 03/29/2016 02/29/2016  Glucose 70 - 140 mg/dl 434(H) 605 Repeated and verified X2.[facility]) 504(HH)  BUN 7.0 - 26.0 mg/dL 13._0 Creatinine 0.6 - 1.1 mg/dL 1.3(H) 0.98 0.93  Sodium 136 - 145  mEq/L 134(L) 130(L) 124(L)  Potassium 3.5 - 5.1 mEq/L 4.6 4.5 3.8  Chloride 96 - 112 mEq/L - 96 90(L)  CO2 22 - 29 mEq/L _0 Calcium 8.4 - 10.4 mg/dL 9.8 8.9 8.8  Total Protein 6.4 - 8.3 g/dL 7.0 - -  Total Bilirubin 0.20 - 1.20 mg/dL 0.31 - -  Alkaline Phos 40 - 150 U/L 82 - -  AST 5 - 34 U/L 25 - -  ALT 0 - 55 U/L 27 - -   Results for MARKETA, MIDKIFF (MRN [id number]) as of 04/06/2016 15:17  Ref. Range 09/08/2015 13:46 12/01/2015 15:14 03/31/2016 14:35  CEA Latest Units: ng/mL 2.1 2.1 2.9 (H)    PATHOLOGY REPORT Diagnosis 04/22/2015 1. Colon, segmental resection for tumor, Sigmoid - COLONIC ADENOCARCINOMA EXTENDING INTO SUBMUCOSA, 1.3 CM - MARGINS NOT INVOLVED. - NINE BENIGN LYMPH NODES (0/9). 2. Colon, segmental resection, Sigmoid - FOCAL MESENTERIC FAT NECROSIS WITH HEMORRHAGE AND FIBROSIS. - NO EVIDENCE OF MALIGNANCY. - ONE BENIGN LYMPH NODE (0/1). 3. Colon, resection margin (donut), Proximal donut - BENIGN COLON TISSUE. - NO EVIDENCE OF MALIGNANCY. 4. Colon, resection margin (donut), Distal donut - BENIGN COLON TISSUE. - NO EVIDENCE OF MALIGNANCY.  Microscopic Comment 1. COLON AND RECTUM (INCLUDING TRANS-ANAL RESECTION): Specimen: Sigmoid colon with proximal and distal donut margins. Procedure: Segmental resection with proximal and  distal donut margins. Tumor site: Sigmoid Specimen integrity: Previously opened Macroscopic intactness of mesorectum: Incomplete Macroscopic tumor perforation: No Invasive tumor: Maximum size: 1.3 cm Histologic type(s): Colonic adenocarcinoma Histologic grade and differentiation: G2: moderately differentiated/low grade Type of polyp in which invasive carcinoma arose: No residual polyp Microscopic extension of invasive tumor: Into submucosa Lymph-Vascular invasion: N Peri-neural invasion: No Tumor deposit(s) (discontinuous extramural extension): No Resection margins: Proximal margin: Free of tumor Distal margin: Free of tumor Circumferential (radial) (posterior ascending, posterior descending; lateral and posterior mid-rectum; and entire lower 1/3 rectum): N/A Mesenteric margin (sigmoid and transverse): Free of tumor Distance closest margin (if all above margins negative): N/A Treatment effect (neo-adjuvant therapy): No Additional polyp(s): No Non-neoplastic findings: Focal mesenteric adipose tissue fat necrosis with hemorrhage and fibrosis. Lymph nodes: number examined 10; number positive: 0 Pathologic Staging: pT1, pN0, pMX Ancillary studies: Pending. (JDP:kh 4 -28-16)  RADIOGRAPHIC STUDIES: I have personally reviewed the radiological images as listed and agreed with the findings in the report.  CT chest, abdomen and pelvis w contrast on 03/06/2015  IMPRESSION: 1. No findings to suggest metastatic disease in the chest, abdomen or pelvis. 2. Diffuse thickening of the colonic wall in the region of the sigmoid colon. Typically this is seen in the setting of acute colitis, however, there are no surrounding inflammatory changes and no definite hypervascularity in the sigmoid mesocolon on today's examination. This may represent sequela of prior episodes of recurrent sigmoid colitis. No discrete mass is identified in this region, but underlying neoplasm in this area is not  excluded. 3. Normal appendix. 4. Atherosclerosis, including left anterior descending coronary artery disease. Please note that although the presence of coronary artery calcium documents the presence of coronary artery disease, the severity of this disease and any potential stenosis cannot be assessed on this non-gated CT examination. Assessment for potential risk factor modification, dietary therapy or pharmacologic therapy may be warranted, if clinically indicated. 5. Additional incidental findings, as above.  ASSESSMENT & PLAN: 50 year old Caucasian female, with multiple medical comorbidities, who was found to have a stage I sigmoid colon cancer.  1. Stage I sigmoid colon adenocarcinoma, pT1N0M0,  moderately differentiated, MMR normall  -I previously reviewed her surgical pathology findings with patient in great details. She appears to have very early stage colon cancer, no lymphovascular invasion, no perineural invasion or other high risk features. Her surgical margins were negative.  -We discussed that her colon cancer is likely cured by complete surgical resection, however she does carry a small risk of cancer recurrence after surgery. -There is no role of adjuvant chemotherapy for stage I colon cancer -Her tumor is MMR normal, she does not have significant family history of colon or endometrial cancer, unlikely Lynch syndrome. -We discussed the surveillance plan. I recommend lab tests including CEA and physical exam every 3-4 months for the first two year, then every 6-12 month for additional 3 years, colonoscopy and CT scan at one year.  -Lab reviewed, CBC and CMP normal, except hyperglycemia and mild slightly elevated creatinine -Her CEA from last week was normal. -She is due for surveillance CT and colonoscopy, I will order the scan and contact Dr. Paulita Fujita   2. Depression and coping -She appears to be depressed, has very flat facial expression, she states this is her normal status and  she denies suicidal ideas.  3. Poorly controlled diabetes and hypertension, history of stroke, COPD -She recently had ED visit for uncontrolled hyperglycemia, BG 293 today  Her BP is also quite high today, asymptomatic  --I encourage her to follow-up with her primary care physician closely   4. Smoking cessation -We had a long discussion about smoking cessation. She is still smoking 1.5 pack a day. -She is willing to cut back and quit, I strongly encouraged her to do so. She is not interested in smoking cessation program.  5. Right foot wound -She will continue follow-up with her primary care physician  Plan -Surveillance CT chest, abdomen and pelvis without contrast (due to elevated Cr) in 2-3 weeks  -Surveillance colonoscopy by Dr. Paulita Fujita -I'll see her back in 4 months with lab.  All questions were answered. The patient knows to call the clinic with any problems, questions or concerns.  I spent 20 minutes counseling the patient face to face. The total time spent in the appointment was 25 minutes and more than 50% was on counseling.     Truitt Merle, MD 04/06/2016 3:17 PM

## 2016-04-07 ENCOUNTER — Ambulatory Visit: Payer: Medicare Other | Admitting: Hematology

## 2016-04-08 ENCOUNTER — Encounter: Payer: Self-pay | Admitting: Hematology

## 2016-04-12 ENCOUNTER — Telehealth: Payer: Self-pay | Admitting: *Deleted

## 2016-04-12 NOTE — Telephone Encounter (Signed)
Pt states she is calling for an appt with Dr. Prudence Davidson.

## 2016-04-13 ENCOUNTER — Encounter: Payer: Self-pay | Admitting: Podiatry

## 2016-04-13 ENCOUNTER — Ambulatory Visit (INDEPENDENT_AMBULATORY_CARE_PROVIDER_SITE_OTHER): Payer: Medicare Other | Admitting: Podiatry

## 2016-04-13 VITALS — BP 128/79 | HR 82 | Resp 14

## 2016-04-13 DIAGNOSIS — E11621 Type 2 diabetes mellitus with foot ulcer: Secondary | ICD-10-CM | POA: Diagnosis not present

## 2016-04-13 DIAGNOSIS — L97501 Non-pressure chronic ulcer of other part of unspecified foot limited to breakdown of skin: Secondary | ICD-10-CM | POA: Diagnosis not present

## 2016-04-13 DIAGNOSIS — L089 Local infection of the skin and subcutaneous tissue, unspecified: Secondary | ICD-10-CM | POA: Diagnosis not present

## 2016-04-13 MED ORDER — FLUCONAZOLE 150 MG PO TABS: 150.0000 mg | ORAL_TABLET | Freq: Once | ORAL | Status: DC

## 2016-04-13 MED ORDER — DOXYCYCLINE HYCLATE 100 MG PO TABS: 100.0000 mg | ORAL_TABLET | Freq: Two times a day (BID) | ORAL | Status: DC

## 2016-04-13 NOTE — Progress Notes (Signed)
Subjective:     Patient ID: [name], female   DOB: 1966/07/03, 50 y.o.   MRN: [id number]  HPIThis patient presents to the office for diabetic ulcer care under big toe joint right foot.  She has also developed a second ulcer on the inside of her fifth toe right foot.   Her diabetic ulcer right foot had closed  to 7 mm x 7 mm and the infection on inside big toe joint has healed right foot. She says she has now developed a red swollen painful area on the front of her right leg.  She says this actually has improved since she has been resting her leg the last three days.  She says her leg pain is 6/10. She presents for evaluation and treatment.    Review of Systems     Objective:   Physical Exam GENERAL APPEARANCE: Alert, conversant. Appropriately groomed. No acute distress.  VASCULAR: Pedal pulses are  palpable at  [facility] and PT bilateral.  Capillary refill time is immediate to all digits,  Normal temperature gradient.  Digital hair growth is present bilateral  NEUROLOGIC: sensation is absent  to 5.07 monofilament at 5/5 sites bilateral.  Light touch is intact bilateral, Muscle strength normal.  MUSCULOSKELETAL: acceptable muscle strength, tone and stability bilateral.  Intrinsic muscluature intact bilateral.  Rectus appearance of foot and digits noted bilateral. Redness and swelling noted anterior right leg.  DERMATOLOGIC: There is development of necrotic tissue with inflamed tissue under big toe joint right foot.  No malodor..  Necrotic tissue has developed around her ulcer right foot.  Red inflamed tissue noted. There is ulcer formed between the fourth and fifth toes right foot.  Necrotic tissue noted on fifth toe. No drainage noted. Her ulcer 1st MPJ now measures 20 x 20 mm. With infectious appearance noted.       Assessment:     Diabetic ulcer with infection  Diabetic ulcer 4/5 toe right foot.  Cellulitis right lower leg     Plan:     ROV  Debride diabetic ulcer sub 1 right foot.  Ulcer  has doubled in size and has appearance of infection.  There is newly developed ulcer 4,5 toes right.  There is cellulitis right lower leg which has only developed three days ago.  She says the redness and swelling has decreased.  Patient treated with debridement of ulcers and neosporin/DSD.  She was prescribed doxycycline for her infected ulcer.  If the lower leg continues to be red and swollen she will need to be evaluated by her medical doctor.  RTC 1 week.   Gardiner Barefoot DPM

## 2016-04-19 ENCOUNTER — Ambulatory Visit: Payer: Medicare Other | Admitting: Podiatry

## 2016-04-20 ENCOUNTER — Other Ambulatory Visit: Payer: Self-pay | Admitting: Hematology

## 2016-04-20 DIAGNOSIS — C187 Malignant neoplasm of sigmoid colon: Secondary | ICD-10-CM

## 2016-04-22 ENCOUNTER — Ambulatory Visit (HOSPITAL_COMMUNITY): Payer: Medicare Other

## 2016-04-26 ENCOUNTER — Encounter: Payer: Self-pay | Admitting: Podiatry

## 2016-04-26 ENCOUNTER — Ambulatory Visit (INDEPENDENT_AMBULATORY_CARE_PROVIDER_SITE_OTHER): Payer: Medicare Other | Admitting: Podiatry

## 2016-04-26 VITALS — BP 107/74 | HR 69 | Resp 12

## 2016-04-26 DIAGNOSIS — E11621 Type 2 diabetes mellitus with foot ulcer: Secondary | ICD-10-CM

## 2016-04-26 DIAGNOSIS — L97501 Non-pressure chronic ulcer of other part of unspecified foot limited to breakdown of skin: Principal | ICD-10-CM

## 2016-04-26 DIAGNOSIS — L89891 Pressure ulcer of other site, stage 1: Secondary | ICD-10-CM

## 2016-04-26 NOTE — Progress Notes (Signed)
Subjective:     Patient ID: [name], female   DOB: Jul 22, 1966, 49 y.o.   MRN: [id number]  HPI this patient returns to the office for continued evaluation of a diabetic ulcer under the big toe joint of the right foot. She has also been diagnosed with a second ulcer on the inside of her fifth toe right foot. She has previously been diagnosed with an infection in her right lower leg, which she was treated with antibiotics and the infection has resolved. She walks into the office wearing her surgical shoe.  Upon sitting in the treatment chair. It was noted that there was no bandage on the ulcers on her right foot. Closer examination does reveal threads from her sock were present in the ulcer under the big toe joint of the right foot. She presents the office today for further evaluation and treatment. This patient is a diabetic who is under poor control. She has significant neuropathy to both her feet. Her first comment to me upon entering the room was this is the first time she could clearly see the fifth toe right foot.   Review of Systems     Objective:   Physical Exam GENERAL APPEARANCE: Alert, conversant. Appropriately groomed. No acute distress.  VASCULAR: Pedal pulses are  palpable at  [facility] and PT bilateral.  Capillary refill time is immediate to all digits,  Normal temperature gradient.    NEUROLOGIC: sensation is absent  to 5.07 monofilament at 5/5 sites bilateral.  Light touch is intact bilateral, Muscle strength normal.  MUSCULOSKELETAL: acceptable muscle strength, tone and stability bilateral.  Intrinsic muscluature intact bilateral.  Rectus appearance of foot and digits noted bilateral. Redness and swelling has resolved right lower leg.  DERMATOLOGIC: thereIs an ulcer under the first MPJ of the right foot that measures 10 x 10 mm there is no redness, swelling or drainage noted from the ulcer at the level of the dorsomedial exostosis was the site of a infected blister. This site has healed  with no evidence of any redness, swelling or infection. There is a ulcer formed on the dorsal aspect of the fifth toe right foot. There is 17 x 10 mm ulcer on the dorsal aspect of the fifth toe right. 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       Plan:    ROV  After measurement of the ulcers was performed on both ulcers. The ulcer under the big toe joint was washed with sterile water and hair and  Threads from her socks  were removed. This ulcer under the big toe joint, actually has improved and shrunk since her last visit. Her right lower leg infection has also resolved with the use of antibiotics. The ulcer is under the first MPJ and the top of the fifth toe right foot were debrided  and bandaged with Neosporin and dry sterile dressing. She says she is taking antibiotics, so therefore no antibiotics were prescribed for this fifth toe. I told this patient that she he needs to soak and take better care of her ulcers through bandaging. She is to return the office in 2 weeks for further evaluation and treatment   Gardiner Barefoot DPM

## 2016-05-03 ENCOUNTER — Ambulatory Visit (HOSPITAL_COMMUNITY): Payer: Medicare Other

## 2016-05-06 ENCOUNTER — Ambulatory Visit (HOSPITAL_COMMUNITY)
Admission: RE | Admit: 2016-05-06 | Discharge: 2016-05-06 | Disposition: A | Discharge status: Home / Self Care | Payer: Medicare Other | Source: Ambulatory Visit | Attending: Hematology | Admitting: Hematology

## 2016-05-06 DIAGNOSIS — Z9049 Acquired absence of other specified parts of digestive tract: Secondary | ICD-10-CM | POA: Insufficient documentation

## 2016-05-06 DIAGNOSIS — K760 Fatty (change of) liver, not elsewhere classified: Secondary | ICD-10-CM | POA: Diagnosis not present

## 2016-05-06 DIAGNOSIS — C187 Malignant neoplasm of sigmoid colon: Secondary | ICD-10-CM | POA: Diagnosis present

## 2016-05-06 DIAGNOSIS — J432 Centrilobular emphysema: Secondary | ICD-10-CM | POA: Insufficient documentation

## 2016-05-10 ENCOUNTER — Ambulatory Visit (INDEPENDENT_AMBULATORY_CARE_PROVIDER_SITE_OTHER): Payer: Medicare Other | Admitting: Podiatry

## 2016-05-10 ENCOUNTER — Encounter: Payer: Self-pay | Admitting: Podiatry

## 2016-05-10 VITALS — BP 145/87 | HR 85 | Resp 16

## 2016-05-10 DIAGNOSIS — L89891 Pressure ulcer of other site, stage 1: Secondary | ICD-10-CM

## 2016-05-10 DIAGNOSIS — L97501 Non-pressure chronic ulcer of other part of unspecified foot limited to breakdown of skin: Principal | ICD-10-CM

## 2016-05-10 DIAGNOSIS — E11621 Type 2 diabetes mellitus with foot ulcer: Secondary | ICD-10-CM

## 2016-05-10 NOTE — Progress Notes (Signed)
Subjective:     Patient ID: [name], female   DOB: [birth year], 50 y.o.   MRN: [id number]  HPI this patient returns to the office for continued evaluation of a diabetic ulcer under the big toe joint of the right foot. She has also been diagnosed with a second ulcer on the inside of her fifth toe right foot. She has previously been diagnosed with an infection in her right lower leg, which she was treated with antibiotics and the infection has resolved. She walks into the office wearing her surgical shoe.  Upon sitting in the treatment chair it was noted that there was no bandage on the ulcers on her right foot. Closer examination does reveal threads  were present in the ulcer under the big toe joint of the right foot. She presents the office today for further evaluation and treatment. This patient is a diabetic who is under poor control. She has significant neuropathy to both her feet.    Review of Systems     Objective:   Physical Exam GENERAL APPEARANCE: Alert, conversant. Appropriately groomed. No acute distress.  VASCULAR: Pedal pulses are  palpable at  [facility] and PT bilateral.  Capillary refill time is immediate to all digits,  Normal temperature gradient.    NEUROLOGIC: sensation is absent  to 5.07 monofilament at 5/5 sites bilateral.  Light touch is intact bilateral, Muscle strength normal.  MUSCULOSKELETAL: acceptable muscle strength, tone and stability bilateral.  Intrinsic muscluature intact bilateral.  Rectus appearance of foot and digits noted bilateral. Redness and swelling has resolved right lower leg.  DERMATOLOGIC: there is  an ulcer under the first MPJ of the right foot that measures 10 x 10 mm. There is no evidence of redness, swelling or drainage noted from the plantar ulcer nor at the site dorsomedial blister right foot. . There is a ulcer formed on the dorsal aspect of the fifth toe right foot. There is 17 x 10 mm ulcer on the dorsal aspect of the fifth toe right. This ulcer is  clean and is not infected at this time.      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       Plan:    ROV  After measurement of the ulcers was performed on both ulcers. The ulcer under big toe joint has not changed in two weeks. . . The ulcer is under the first MPJ and the top of the fifth toe right foot were debrided  and bandaged with Neosporin and dry sterile dressing. . I told this patient that she he needs to soak and take better care of her ulcers through bandaging. She is to return the office in 3 weeks for further evaluation and treatment    Gardiner Barefoot DPM

## 2016-05-14 ENCOUNTER — Other Ambulatory Visit: Payer: Self-pay | Admitting: Endocrinology

## 2016-05-16 ENCOUNTER — Encounter: Payer: Medicare Other | Admitting: Cardiology

## 2016-05-16 ENCOUNTER — Ambulatory Visit (INDEPENDENT_AMBULATORY_CARE_PROVIDER_SITE_OTHER): Payer: Medicare Other | Admitting: Cardiology

## 2016-05-16 ENCOUNTER — Encounter: Payer: Self-pay | Admitting: Cardiology

## 2016-05-16 VITALS — BP 122/80 | HR 98 | Ht 65.0 in | Wt 210.4 lb

## 2016-05-16 DIAGNOSIS — M79606 Pain in leg, unspecified: Secondary | ICD-10-CM

## 2016-05-16 DIAGNOSIS — E785 Hyperlipidemia, unspecified: Secondary | ICD-10-CM | POA: Diagnosis not present

## 2016-05-16 DIAGNOSIS — Z720 Tobacco use: Secondary | ICD-10-CM | POA: Diagnosis not present

## 2016-05-16 DIAGNOSIS — I10 Essential (primary) hypertension: Secondary | ICD-10-CM | POA: Diagnosis not present

## 2016-05-16 NOTE — Progress Notes (Signed)
[facility]. [address] [place], [place]  [address] Phone: [phone] Fax:  [phone]  Date:  05/17/2016   ID:  [name], DOB 04-Mar-1966, MRN [id number]  PCP:  Tamsen Roers, MD   History of Present Illness: [name] is a 50 y.o. female who underwent cardiac catheterization on 02/12/14 after abnormal stress test which revealed normal coronary arteries thankfully however approximately 3 hours after procedure and she was getting up to go to the bathroom, she noted diplopia and had a third nerve palsy, intranuclear ophthalmoplegia. Code stroke was called, neurology assessed her and she was monitored in the hospital for the next 24-48 hours. During that same period of time, her significant other was hospitalized as well for heart failure exacerbation.  On 02/25/14 she called Dr. Clydene Fake office with some concerns of transient 5 minute episode of sharp shooting pain in the back of the head and uncontrollable shaking and felt a heart arrhythmia. Pain was on the right side on the top of her head, earlobe at the back of the head.  Ejection fraction appeared normal on catheterization. EF on echo 01/2014 was 45%. EF prior to cath was 30-35% estimated on echo.  Eye patch was recommended rotating between eye every 2 hours. Full dose of that was recommended by neurology.  05/08/14-doing very well. Trying to stop smoking but this is challenging for her. She is having some lower extremity edema. On spironolactone. No chest pain. She does have shortness of breath with exertion at times. Coronary arteries were reassuring.  05/16/16-she was seen by Cecilie Kicks on 11/17/15 for refills on medications. She was not having any chest pain or shortness of breath at the time. Occasional facial swelling for which steroids helped. E cigarettes. 5 great. Adderall.  She has flattened affect. Edema noted - lasix. She thought that she had another small stroke, did not seek attention. Symptoms resolved. No  CP, no SOB.   Wt Readings from Last 3 Encounters:  05/16/16 210 lb 6.4 oz (95.437 kg)  04/06/16 220 lb 1.6 oz (99.837 kg)  03/03/16 214 lb 12.8 oz (97.433 kg)     Past Medical History  Diagnosis Date  . Hypertension   . Hyperlipemia   . GERD (gastroesophageal reflux disease)   . Insomnia   . SUI (stress urinary incontinence, female) S/P SLING 12-29-2011  . SOB (shortness of breath) on exertion   . Bipolar disorder ([place])   . Neurogenic bladder   . Hypercholesterolemia   . Gastroparesis   . Ulcer   . MRSA (methicillin resistant Staphylococcus aureus)   . Chronic pain   . Edema   . Chest pain     a. 2008 Cath: normal cors;  b. 12/2013 [facility] CL: EF 47%, no ischemia/infarct.  . Anxiety   . Vertigo   . COPD (chronic obstructive pulmonary disease) ([place])   . Cholecystitis   . Hypersomnia   . Tobacco abuse   . Claudication ([place])     a. 12/2013 ABI's: R 0.97, L 0.94.  [name] Palpitations   . Anginal pain ([place])   . Pneumonia     "twice, I think" (02/12/2014)  . Sleep apnea     didn't tolerate cpap (02/12/2014)  . Type II diabetes mellitus ([place])   . History of blood transfusion 1986    "related to lost a child" (02/12/2014)  . H/O hiatal hernia   . [id number])     "weekly" (02/12/2014)  . Stroke ([place]) 02/12/2014    "  eyesight is messed up; not steady on my feet" (02/12/2014)  . Arthritis     "qwhere" (02/12/2014)  . DJD (degenerative joint disease)   . Chronic back pain   . Depression   . Personality disorder   . OSA (obstructive sleep apnea) 07/04/2014    Past Surgical History  Procedure Laterality Date  . Knee arthroscopy w/ allograft impant Left     graft x 2  . Foot surgery Bilateral   . Anterior cervical decomp/discectomy fusion  2000    C6 - 7  . Pubovaginal sling  12/29/2011    Procedure: Gaynelle Arabian;  Surgeon: Reece Packer, MD;  Location: [facility];  Service: Urology;  Laterality: N/A;  cysto and sparc sling   . Lumbar fusion      "cage in  my spine"  . Total abdominal hysterectomy w/ bilateral salpingoophorectomy  1997  . Multiple laparoscopies for endometriosis    . Cesarean section  1989; 1992  . Repeat reconstruction acl left knee/ screws removed  03-28-2000    CADAVER GRAFT  . 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 Knee surgery      TOTAL 8 SURG'S  . Cystoscopy with injection  05/04/2012    Procedure: CYSTOSCOPY WITH INJECTION;  Surgeon: Reece Packer, MD;  Location: [facility];  Service: Urology;  Laterality: N/A;  MACROPLASTIQUE INJECTION  . Cystoscopy with injection  08/28/2012    Procedure: CYSTOSCOPY WITH INJECTION;  Surgeon: Reece Packer, MD;  Location: [facility];  Service: Urology;  Laterality: N/A;  cysto and macroplastique   . Carpal tunnel release Right 04-25-2013  . Cysto N/A 04/30/2013    Procedure: CYSTOSCOPY;  Surgeon: Reece Packer, MD;  Location: [facility];  Service: Urology;  Laterality: N/A;  . Pubovaginal sling N/A 04/30/2013    Procedure: REMOVAL OF VAGINAL MESH;  Surgeon: Reece Packer, MD;  Location: [facility];  Service: Urology;  Laterality: N/A;  . Abdominal hysterectomy    . Hernia repair  ?1996    "stomach"  . Cardiac catheterization  05-22-2008   DR Jubilee Vivero    NO SIGNIFECANT CAD/ NORMAL LV/  EF 65%/  NO WALL MOTION ABNORMALITIES  . Cardiac catheterization  02/12/2014  . Left heart catheterization with coronary angiogram N/A 02/12/2014    Procedure: LEFT HEART CATHETERIZATION WITH CORONARY ANGIOGRAM;  Surgeon: Candee Furbish, MD;  Location: [facility] CATH LAB;  Service: Cardiovascular;  Laterality: N/A;  . Laparoscopic sigmoid colectomy  04/22/2015  . Laparoscopic sigmoid colectomy N/A 04/22/2015    Procedure: LAPAROSCOPIC HAND ASSISTED SIGMOID COLECTOMY;  Surgeon: Erroll Luna, MD;  Location: [facility];  Service: General;  Laterality: N/A;    Current Outpatient Prescriptions  Medication Sig Dispense Refill  . amphetamine-dextroamphetamine (ADDERALL) 30  MG tablet Take 30 mg by mouth 2 (two) times daily.   0  . atorvastatin (LIPITOR) 40 MG tablet Take 1 tablet (40 mg total) by mouth daily at 6 PM. 90 tablet 3  . canagliflozin (INVOKANA) 300 MG TABS tablet Take 300 mg by mouth daily before breakfast. 30 tablet 3  . cephALEXin (KEFLEX) 500 MG capsule Take 1 capsule by mouth daily.    . CHANTIX 0.5 MG tablet Take 1 mg by mouth daily.     . chlorproMAZINE (THORAZINE) 200 MG tablet Take 200 mg by mouth at bedtime.    . colchicine 0.6 MG tablet Take 0.6 mg by mouth daily.    . cyclobenzaprine (FLEXERIL) 10 MG  tablet Take 10 mg by mouth at bedtime as needed for muscle spasms.   0  . diazepam (VALIUM) 10 MG tablet Take 10 mg by mouth at bedtime as needed for anxiety or sleep.     [name] doxycycline (VIBRA-TABS) 100 MG tablet Take 1 tablet (100 mg total) by mouth 2 (two) times daily. 20 tablet 0  . esomeprazole (NEXIUM) 40 MG capsule Take 40 mg by mouth daily.     . fenofibrate (TRICOR) 145 MG tablet TAKE 1 TABLET (145 MG TOTAL) BY MOUTH DAILY. 30 tablet 5  . fluconazole (DIFLUCAN) 150 MG tablet Take 1 tablet (150 mg total) by mouth once. 1 tablet 0  . furosemide (LASIX) 20 MG tablet TAKE 1 TABLET BY MOUTH AS NEEDED 15 tablet 1  . glucose blood (FREESTYLE LITE) test strip Use as instructed to check blood sugar 3 times per day dx code E11.65 100 each 5  . insulin aspart protamine - aspart (NOVOLOG MIX 70/30 FLEXPEN) (70-30) 100 UNIT/ML FlexPen Inject 30 units in the morning and 20 units in the evening. 15 mL 5  . ketoconazole (NIZORAL) 2 % cream Apply 1 application topically daily as needed for irritation.     . Lancets (FREESTYLE) lancets Use as instructed to check blood sugar 3 times per day dx code E11.65 100 each 5  . oxyCODONE (OXY IR/ROXICODONE) 5 MG immediate release tablet Take 5 mg by mouth 2 (two) times daily as needed for moderate pain or severe pain.     [name] oxyCODONE-acetaminophen (PERCOCET) 10-325 MG per tablet Take 1 tablet by mouth 3 (three) times  daily as needed for pain.    . phentermine (ADIPEX-P) 37.5 MG tablet Take 37.5 mg by mouth daily as needed (appetite control).   0  . potassium chloride SA (K-DUR,KLOR-CON) 20 MEQ tablet Take 1 tablet (20 mEq total) by mouth daily. 30 tablet 0  . rizatriptan (MAXALT) 10 MG tablet Take 1 tablet (10 mg total) by mouth as needed for migraine. May repeat in 2 hours if needed 10 tablet 3  . spironolactone (ALDACTONE) 100 MG tablet Take 1 tablet (100 mg total) by mouth daily. 30 tablet 5  . tamsulosin (FLOMAX) 0.4 MG CAPS capsule Take 0.4 mg by mouth daily.    . TRULICITY [id number] SOPN Inject 5 mLs as directed once a week.     . Vortioxetine HBr (TRINTELLIX) 20 MG TABS Take 20 mg by mouth daily.     . Vortioxetine HBr 5 MG TABS Take 5 mg by mouth daily.     No current facility-administered medications for this visit.    Allergies:    Allergies  Allergen Reactions  . Latex Itching, Rash and Other (See Comments)    Pt states she cannot use condoms - cause an infection.  Use of latex on skin is okay.  Tape causes rash  . Ibuprofen Other (See Comments)    headaches  . Sweetening Enhancer [Flavoring Agent] Nausea And Vomiting    And headaches  . Tetracyclines & Related Nausea And Vomiting    Yeast infections  . Trazodone And Nefazodone Other (See Comments)    Hallucinations   . Triazolam Other (See Comments)    hallucinations  . Aspartame And Phenylalanine Nausea And Vomiting    headaches    Social History:  The patient  reports that she has been smoking Cigarettes and E-cigarettes.  She has a 41 pack-year smoking history. She has never used smokeless tobacco. She reports that she drinks alcohol.  She reports that she does not use illicit drugs.   ROS:  Please see the history of present illness.   Improved diplopia however still present. No chest pain, no significant short of breath. No further strokelike symptoms.  PHYSICAL EXAM: VS:  There were no vitals taken for this visit. Well  nourished, well developed, in no acute distress HEENT: normal Neck: no JVD Cardiac:  normal S1, S2; RRR; no murmur Lungs:  clear to auscultation bilaterally, no wheezing, rhonchi or rales Abd: soft, nontender, no hepatomegaly Ext: mild LE, bilat edema, unchanged., right foot shoe specialized for ulcer Skin: warm and dry Neuro: Her eye deviation has improved. Less marked nerve 3 palsy.  EKG: None today Cardiac catheterization 2/15-no angiographic significant CAD-EF 55%    ASSESSMENT AND PLAN:  1. Tobacco use-encourage cessation. She is using e-cigarette.  2. Diabetes-hemoglobin A1c back up. She is working with Dr. Dwyane Dee on this. 3. Peripheral neuropathy-resulted in right foot diabetic ulcers. Will check ABI/ [place] to ensure proper flow. Pulses reduced.  4. Peripheral edema-I have given her a prescription for support hose previously. 5. I will see her back in 12 months. Reassuring angiogram. I would like for her to be on atorvastatin 40 mg once a day. LDL 62. Great. 6. She may continue with Adderall if necessary given her normal coronary arteries.  Signed, Candee Furbish, MD [facility]  05/17/2016 7:09 AM    This encounter was created in error - please disregard.

## 2016-05-16 NOTE — Progress Notes (Addendum)
[place]. [address], [place]  [address] Phone: [phone] Fax:  [phone]  Date:  05/16/2016   ID:  [name], DOB 26-Aug-1966, MRN [id number]  PCP:  Tamsen Roers, MD   History of Present Illness: [name] is a 50 y.o. female who underwent cardiac catheterization on 02/12/14 after abnormal stress test which revealed normal coronary arteries thankfully however approximately 3 hours after procedure and she was getting up to go to the bathroom, she noted diplopia and had a third nerve palsy, intranuclear ophthalmoplegia. Code stroke was called, neurology assessed her and she was monitored in the hospital for the next 24-48 hours. During that same period of time, her significant other was hospitalized as well for heart failure exacerbation.  On 02/25/14 she called Dr. Clydene Fake office with some concerns of transient 5 minute episode of sharp shooting pain in the back of the head and uncontrollable shaking and felt a heart arrhythmia. Pain was on the right side on the top of her head, earlobe at the back of the head.  Ejection fraction appeared normal on catheterization. EF on echo 01/2014 was 45%. EF prior to cath was 30-35% estimated on echo.  Eye patch was recommended rotating between eye every 2 hours. Full dose of that was recommended by neurology.  05/08/14-doing very well. Trying to stop smoking but this is challenging for her. She is having some lower extremity edema. On spironolactone. No chest pain. She does have shortness of breath with exertion at times. Coronary arteries were reassuring.  05/16/16-she was seen by Cecilie Kicks on 11/17/15 for refills on medications. She was not having any chest pain or shortness of breath at the time. Occasional facial swelling for which steroids helped. E cigarettes. 5 great. Adderall. Unfortunately, she has developed a right foot diabetic ulcer. Difficult to palpate distal pulses. She also thinks that she may have had  another stroke that her symptoms have resolved. No chest pain, no bleeding. She does have swelling at times.  Wt Readings from Last 3 Encounters:  05/16/16 210 lb 6.4 oz (95.437 kg)  04/06/16 220 lb 1.6 oz (99.837 kg)  03/03/16 214 lb 12.8 oz (97.433 kg)     Past Medical History  Diagnosis Date  . Hypertension   . Hyperlipemia   . GERD (gastroesophageal reflux disease)   . Insomnia   . SUI (stress urinary incontinence, female) S/P SLING 12-29-2011  . SOB (shortness of breath) on exertion   . Bipolar disorder ([place])   . Neurogenic bladder   . Hypercholesterolemia   . Gastroparesis   . Ulcer   . MRSA (methicillin resistant Staphylococcus aureus)   . Chronic pain   . Edema   . Chest pain     a. 2008 Cath: normal cors;  b. 12/2013 Lexi CL: EF 47%, no ischemia/infarct.  . Anxiety   . Vertigo   . COPD (chronic obstructive pulmonary disease) ([place])   . Cholecystitis   . Hypersomnia   . Tobacco abuse   . Claudication ([place])     a. 12/2013 ABI's: R 0.97, L 0.94.  [name] Palpitations   . Anginal pain ([place])   . Pneumonia     "twice, I think" (02/12/2014)  . Sleep apnea     didn't tolerate cpap (02/12/2014)  . Type II diabetes mellitus ([place])   . History of blood transfusion 1986    "related to lost a child" (02/12/2014)  . H/O hiatal hernia   . Headache(784.0)     "  weekly" (02/12/2014)  . Stroke ([place]) 02/12/2014    "eyesight is messed up; not steady on my feet" (02/12/2014)  . Arthritis     "qwhere" (02/12/2014)  . DJD (degenerative joint disease)   . Chronic back pain   . Depression   . Personality disorder   . OSA (obstructive sleep apnea) 07/04/2014    Past Surgical History  Procedure Laterality Date  . Knee arthroscopy w/ allograft impant Left     graft x 2  . Foot surgery Bilateral   . Anterior cervical decomp/discectomy fusion  2000    C6 - 7  . Pubovaginal sling  12/29/2011    Procedure: Gaynelle Arabian;  Surgeon: Reece Packer, MD;  Location: [facility];  Service: Urology;  Laterality: N/A;  cysto and sparc sling   . Lumbar fusion      "cage in my spine"  . Total abdominal hysterectomy w/ bilateral salpingoophorectomy  1997  . Multiple laparoscopies for endometriosis    . Cesarean section  1989; 1992  . Repeat reconstruction acl left knee/ screws removed  03-28-2000    CADAVER GRAFT  . 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 Knee surgery      TOTAL 8 SURG'S  . Cystoscopy with injection  05/04/2012    Procedure: CYSTOSCOPY WITH INJECTION;  Surgeon: Reece Packer, MD;  Location: [facility];  Service: Urology;  Laterality: N/A;  MACROPLASTIQUE INJECTION  . Cystoscopy with injection  08/28/2012    Procedure: CYSTOSCOPY WITH INJECTION;  Surgeon: Reece Packer, MD;  Location: [facility];  Service: Urology;  Laterality: N/A;  cysto and macroplastique   . Carpal tunnel release Right 04-25-2013  . Cysto N/A 04/30/2013    Procedure: CYSTOSCOPY;  Surgeon: Reece Packer, MD;  Location: [facility];  Service: Urology;  Laterality: N/A;  . Pubovaginal sling N/A 04/30/2013    Procedure: REMOVAL OF VAGINAL MESH;  Surgeon: Reece Packer, MD;  Location: [facility];  Service: Urology;  Laterality: N/A;  . Abdominal hysterectomy    . Hernia repair  ?1996    "stomach"  . Cardiac catheterization  05-22-2008   DR SKAINS    NO SIGNIFECANT CAD/ NORMAL LV/  EF 65%/  NO WALL MOTION ABNORMALITIES  . Cardiac catheterization  02/12/2014  . Left heart catheterization with coronary angiogram N/A 02/12/2014    Procedure: LEFT HEART CATHETERIZATION WITH CORONARY ANGIOGRAM;  Surgeon: Candee Furbish, MD;  Location: [facility];  Service: Cardiovascular;  Laterality: N/A;  . Laparoscopic sigmoid colectomy  04/22/2015  . Laparoscopic sigmoid colectomy N/A 04/22/2015    Procedure: LAPAROSCOPIC HAND ASSISTED SIGMOID COLECTOMY;  Surgeon: Erroll Luna, MD;  Location: [facility];  Service: General;  Laterality: N/A;    Current  Outpatient Prescriptions  Medication Sig Dispense Refill  . amphetamine-dextroamphetamine (ADDERALL) 30 MG tablet Take 30 mg by mouth 2 (two) times daily.   0  . atorvastatin (LIPITOR) 40 MG tablet Take 1 tablet (40 mg total) by mouth daily at 6 PM. 90 tablet 3  . canagliflozin (INVOKANA) 300 MG TABS tablet Take 300 mg by mouth daily before breakfast. 30 tablet 3  . cephALEXin (KEFLEX) 500 MG capsule Take 1 capsule by mouth daily.    . CHANTIX 0.5 MG tablet Take 1 mg by mouth daily.     . chlorproMAZINE (THORAZINE) 200 MG tablet Take 200 mg by mouth at bedtime.    . colchicine 0.6 MG tablet Take 0.6 mg by  mouth daily.    . cyclobenzaprine (FLEXERIL) 10 MG tablet Take 10 mg by mouth at bedtime as needed for muscle spasms.   0  . diazepam (VALIUM) 10 MG tablet Take 10 mg by mouth at bedtime as needed for anxiety or sleep.     [name] doxycycline (VIBRA-TABS) 100 MG tablet Take 1 tablet (100 mg total) by mouth 2 (two) times daily. 20 tablet 0  . esomeprazole (NEXIUM) 40 MG capsule Take 40 mg by mouth daily.     . fenofibrate (TRICOR) 145 MG tablet TAKE 1 TABLET (145 MG TOTAL) BY MOUTH DAILY. 30 tablet 5  . fluconazole (DIFLUCAN) 150 MG tablet Take 1 tablet (150 mg total) by mouth once. 1 tablet 0  . furosemide (LASIX) 20 MG tablet TAKE 1 TABLET BY MOUTH AS NEEDED 15 tablet 1  . glucose blood (FREESTYLE LITE) test strip Use as instructed to check blood sugar 3 times per day dx code E11.65 100 each 5  . insulin aspart protamine - aspart (NOVOLOG MIX 70/30 FLEXPEN) (70-30) 100 UNIT/ML FlexPen Inject 30 units in the morning and 20 units in the evening. 15 mL 5  . ketoconazole (NIZORAL) 2 % cream Apply 1 application topically daily as needed for irritation.     . Lancets (FREESTYLE) lancets Use as instructed to check blood sugar 3 times per day dx code E11.65 100 each 5  . oxyCODONE (OXY IR/ROXICODONE) 5 MG immediate release tablet Take 5 mg by mouth 2 (two) times daily as needed for moderate pain or severe  pain.     [name] oxyCODONE-acetaminophen (PERCOCET) 10-325 MG per tablet Take 1 tablet by mouth 3 (three) times daily as needed for pain.    . phentermine (ADIPEX-P) 37.5 MG tablet Take 37.5 mg by mouth daily as needed (appetite control).   0  . potassium chloride SA (K-DUR,KLOR-CON) 20 MEQ tablet Take 1 tablet (20 mEq total) by mouth daily. 30 tablet 0  . rizatriptan (MAXALT) 10 MG tablet Take 1 tablet (10 mg total) by mouth as needed for migraine. May repeat in 2 hours if needed 10 tablet 3  . spironolactone (ALDACTONE) 100 MG tablet Take 1 tablet (100 mg total) by mouth daily. 30 tablet 5  . tamsulosin (FLOMAX) 0.4 MG CAPS capsule Take 0.4 mg by mouth daily.    . TRULICITY [id number] [id number] SOPN Inject 5 mLs as directed once a week.     . Vortioxetine HBr (TRINTELLIX) 20 MG TABS Take 20 mg by mouth daily.     . Vortioxetine HBr 5 MG TABS Take 5 mg by mouth daily.     No current facility-administered medications for this visit.    Allergies:    Allergies  Allergen Reactions  . Latex Itching, Rash and Other (See Comments)    Pt states she cannot use condoms - cause an infection.  Use of latex on skin is okay.  Tape causes rash  . Ibuprofen Other (See Comments)    headaches  . Sweetening Enhancer [Flavoring Agent] Nausea And Vomiting    And headaches  . Tetracyclines & Related Nausea And Vomiting    Yeast infections  . Trazodone And Nefazodone Other (See Comments)    Hallucinations   . Triazolam Other (See Comments)    hallucinations  . Aspartame And Phenylalanine Nausea And Vomiting    headaches    Social History:  The patient  reports that she has been smoking Cigarettes and E-cigarettes.  She has a 41 pack-year smoking history. She has  never used smokeless tobacco. She reports that she drinks alcohol. She reports that she does not use illicit drugs.   ROS:  Please see the history of present illness.   Improved diplopia however still present. No chest pain, no significant short of  breath. No further strokelike symptoms.  PHYSICAL EXAM: VS:  BP 122/80 mmHg  Pulse 98  Ht 5\' 5"  (1.651 m)  Wt 210 lb 6.4 oz (95.437 kg)  BMI 35.01 kg/m2 Well nourished, well developed, in no acute distress HEENT: normal Neck: no JVD Cardiac:  normal S1, S2; RRR; no murmur Lungs:  clear to auscultation bilaterally, no wheezing, rhonchi or rales Abd: soft, nontender, no hepatomegaly Ext: mild LE, bilat edema, right foot boot, difficult to palpate distal pulses Skin: warm and dry Neuro: Her eye deviation has improved. Less marked nerve 3 palsy.  EKG: None today Cardiac catheterization 2/15-no angiographic significant CAD-EF 55%   Labs: 02/29/16-hemoglobin A1c 12.3  ASSESSMENT AND PLAN:  1. Tobacco use-encourage cessation. She is using e-cigarette. 2. Diabetes-hemoglobin A1c still elevated. She is working with Dr. Dwyane Dee on this. 3. Peripheral neuropathy- 4. Peripheral edema-Lasix as needed 5. I will see her back in 12 months. Reassuring angiogram.  atorvastatin 40 mg once a day. LDL 62. Great. 6. She may tinea with Adderall if necessary given her normal coronary arteries. 7. Diabetic foot ulcer-peripheral neuropathy precipitating. I'm having trouble palpating lower extremity pulses. I will check lower extremity ABIs. 8. History of stroke-she feels as though she may have had another stroke. She states that she is now back to normal. I do notice some mild ptosis perhaps left greater than right. Consider repeat neurology appointment.  Signed, Candee Furbish, MD [facility]  05/16/2016 3:56 PM

## 2016-05-16 NOTE — Patient Instructions (Signed)
Medication Instructions:  The current medical regimen is effective;  continue present plan and medications.  Testing/Procedures: Your physician has requested that you have a lower extremity arterial exercise duplex. During this test, exercise and ultrasound are used to evaluate arterial blood flow in the legs. Allow one hour for this exam. There are no restrictions or special instructions.  Follow-Up: Follow up in 1 year with Dr. Skains.  You will receive a letter in the mail 2 months before you are due.  Please call us when you receive this letter to schedule your follow up appointment.  If you need a refill on your cardiac medications before your next appointment, please call your pharmacy.  Thank you for choosing [facility]!!     

## 2016-05-17 ENCOUNTER — Telehealth: Payer: Self-pay | Admitting: *Deleted

## 2016-05-17 NOTE — Telephone Encounter (Signed)
Called pt and left message on voice mail re:  CT results done 5/12 showed no evidence of recurrence as per Dr. Ernestina Penna instructions.

## 2016-05-18 ENCOUNTER — Other Ambulatory Visit: Payer: Self-pay | Admitting: Cardiology

## 2016-05-18 DIAGNOSIS — R0989 Other specified symptoms and signs involving the circulatory and respiratory systems: Secondary | ICD-10-CM

## 2016-05-18 DIAGNOSIS — L97511 Non-pressure chronic ulcer of other part of right foot limited to breakdown of skin: Secondary | ICD-10-CM

## 2016-05-24 ENCOUNTER — Ambulatory Visit (HOSPITAL_COMMUNITY)
Admission: RE | Admit: 2016-05-24 | Discharge: 2016-05-24 | Disposition: A | Discharge status: Home / Self Care | Payer: Medicare Other | Source: Ambulatory Visit | Attending: Cardiology | Admitting: Cardiology

## 2016-05-24 DIAGNOSIS — E785 Hyperlipidemia, unspecified: Secondary | ICD-10-CM | POA: Insufficient documentation

## 2016-05-24 DIAGNOSIS — L97511 Non-pressure chronic ulcer of other part of right foot limited to breakdown of skin: Secondary | ICD-10-CM

## 2016-05-24 DIAGNOSIS — E119 Type 2 diabetes mellitus without complications: Secondary | ICD-10-CM | POA: Insufficient documentation

## 2016-05-24 DIAGNOSIS — R0989 Other specified symptoms and signs involving the circulatory and respiratory systems: Secondary | ICD-10-CM | POA: Diagnosis not present

## 2016-05-24 DIAGNOSIS — I10 Essential (primary) hypertension: Secondary | ICD-10-CM | POA: Insufficient documentation

## 2016-05-24 DIAGNOSIS — F329 Major depressive disorder, single episode, unspecified: Secondary | ICD-10-CM | POA: Diagnosis not present

## 2016-05-24 DIAGNOSIS — G4733 Obstructive sleep apnea (adult) (pediatric): Secondary | ICD-10-CM | POA: Diagnosis not present

## 2016-05-24 DIAGNOSIS — K219 Gastro-esophageal reflux disease without esophagitis: Secondary | ICD-10-CM | POA: Diagnosis not present

## 2016-05-24 DIAGNOSIS — Z720 Tobacco use: Secondary | ICD-10-CM | POA: Diagnosis not present

## 2016-05-25 ENCOUNTER — Other Ambulatory Visit: Payer: Self-pay | Admitting: *Deleted

## 2016-05-25 ENCOUNTER — Other Ambulatory Visit (INDEPENDENT_AMBULATORY_CARE_PROVIDER_SITE_OTHER): Payer: Medicare Other

## 2016-05-25 DIAGNOSIS — E118 Type 2 diabetes mellitus with unspecified complications: Principal | ICD-10-CM

## 2016-05-25 DIAGNOSIS — E1165 Type 2 diabetes mellitus with hyperglycemia: Secondary | ICD-10-CM | POA: Diagnosis not present

## 2016-05-25 DIAGNOSIS — E785 Hyperlipidemia, unspecified: Secondary | ICD-10-CM

## 2016-05-25 LAB — BASIC METABOLIC PANEL
BUN: 8 mg/dL (ref 6–23)
CHLORIDE: 99 meq/L (ref 96–112)
CO2: 25 meq/L (ref 19–32)
CREATININE: 0.85 mg/dL (ref 0.40–1.20)
Calcium: 8.9 mg/dL (ref 8.4–10.5)
GFR: 74.94 mL/min (ref >60.00)
Glucose, Bld: 363 mg/dL — ABNORMAL HIGH (ref 70–99)
POTASSIUM: 3.3 meq/L — AB (ref 3.5–5.1)
Sodium: 132 mEq/L — ABNORMAL LOW (ref 135–145)

## 2016-05-25 LAB — LIPID PANEL
CHOL/HDL RATIO: 3
CHOLESTEROL: 95 mg/dL (ref 0–200)
HDL: 28.3 mg/dL — AB (ref >39.00)
NonHDL: 66.47
Triglycerides: 307 mg/dL — ABNORMAL HIGH (ref 0.0–149.0)
VLDL: 61.4 mg/dL — AB (ref 0.0–40.0)

## 2016-05-25 LAB — LDL CHOLESTEROL, DIRECT: Direct LDL: 40 mg/dL

## 2016-05-25 LAB — HEMOGLOBIN A1C: HEMOGLOBIN A1C: 10.2 % — AB (ref 4.6–6.5)

## 2016-05-27 ENCOUNTER — Ambulatory Visit (INDEPENDENT_AMBULATORY_CARE_PROVIDER_SITE_OTHER): Payer: Medicare Other | Admitting: Endocrinology

## 2016-05-27 ENCOUNTER — Encounter: Payer: Self-pay | Admitting: Endocrinology

## 2016-05-27 VITALS — BP 130/80 | HR 90 | Temp 97.7°F | Resp 16 | Ht 65.0 in | Wt 210.4 lb

## 2016-05-27 DIAGNOSIS — E876 Hypokalemia: Secondary | ICD-10-CM | POA: Diagnosis not present

## 2016-05-27 DIAGNOSIS — E1165 Type 2 diabetes mellitus with hyperglycemia: Secondary | ICD-10-CM

## 2016-05-27 DIAGNOSIS — E1142 Type 2 diabetes mellitus with diabetic polyneuropathy: Secondary | ICD-10-CM | POA: Diagnosis not present

## 2016-05-27 DIAGNOSIS — Z794 Long term (current) use of insulin: Secondary | ICD-10-CM

## 2016-05-27 MED ORDER — POTASSIUM CHLORIDE CRYS ER 20 MEQ PO TBCR: 20.0000 meq | EXTENDED_RELEASE_TABLET | Freq: Every day | ORAL | Status: DC

## 2016-05-27 NOTE — Progress Notes (Signed)
Patient ID: [name], female   DOB: 01/20/1966, 49 y.o.   MRN: [id number]   Reason for Appointment: Diabetes follow-up    History of Present Illness:   Diagnosis: Type 2 diabetes mellitus, date of diagnosis: 2004.  PAST history: She has been treated mostly with insulin since about a year after diagnosis. She has had difficulty with consistent compliance with diet and also compliance with self care including glucose monitoring over the years. She had been mostly treated with basal insulin. Also had been tried on mealtime insulin but she would be noncompliant with this. Was tried on Prandin for mealtime control but difficult to judge efficacy because of lack of postprandial monitoring. Was given Victoza to start in 2011 but did not follow up after this.She was tried on premixed insulin but this did not help her control, mostly because of noncompliance with the doses. She had been using the V- go pump and had better control initially and was better compliant with the daily routine and boluses. She has had frequent education visits also. She stopped using her V.-go pump in 6/15 because of discomfort at the site of application Also did not improve with a trial of Victoza   RECENT history:    INSULIN dose:  Novolog Mix 30 units qam  Non-insulin hypoglycemic drugs: Invokana 300 mg in the morning, Trulicity [id number] mg weekly  Her A1c previously has been consistently under 7% and more recently blood sugars had been progressively worse A1c has increased recently this year and now 10.2 This is related to continued non-compliance.  Current management and problems identified:  She has not checked her blood sugars much and does not know what they are, lab glucose 363  Again she is noncompliant with taking her evening insulin at suppertime and is taking this mostly in the morning  Despite reminders to cut back on drinks with sugar she is not drinking juices instead of soft drinks  She  is getting Trulicity from her PCP but this does not appear to be helping her control  Not able to exercise because of foot pain and ulcers requiring chronic treatment  Mealtimes are also sporadic and  inconsistent also a new ulcer  Glucometer:  FreeStyle.  Checking blood sugar very sporadically and did not bring her monitor for review as before Blood Glucose readings:  She does not remember any  Hypoglycemia frequency: Never.    Food preferences: eating 1 or 2 meals per day, variable intake, sandwiches at times or otherwise snacks, dinner usually 8 PM  Physical activity: exercise: Minimal.  She has difficulties with leg pain Certified Diabetes Educator visit: Most recent:, 2/14.   The diet that the patient has been following is no specific diet; still eating at irregular times; still drinking drinks with sugar claiming she is allergic to artificial sweeteners   Wt Readings from Last 3 Encounters:  05/27/16 210 lb 6.4 oz (95.437 kg)  05/16/16 210 lb 6.4 oz (95.437 kg)  04/06/16 220 lb 1.6 oz (99.837 kg)   DM labs:   Lab Results  Component Value Date   HGBA1C 10.2* 05/25/2016   HGBA1C 12.3* 02/29/2016   HGBA1C 8.4* 01/01/2016   Lab Results  Component Value Date   MICROALBUR <0.7 08/12/2015   LDLCALC 41 01/01/2016   CREATININE 0.85 05/25/2016    Lab on 05/25/2016  Component Date Value Ref Range Status  . Sodium 05/25/2016 132* 135 - 145 mEq/L Final  . Potassium 05/25/2016 3.3* 3.5 - 5.1 mEq/L  Final  . Chloride 05/25/2016 99  96 - 112 mEq/L Final  . CO2 05/25/2016 25  19 - 32 mEq/L Final  . Glucose, Bld 05/25/2016 363* 70 - 99 mg/dL Final  . BUN 05/25/2016 8  6 - 23 mg/dL Final  . Creatinine, Ser 05/25/2016 0.85  0.40 - 1.20 mg/dL Final  . Calcium 05/25/2016 8.9  8.4 - 10.5 mg/dL Final  . GFR 05/25/2016 74.94  >60.00 mL/min Final  . Hgb A1c MFr Bld 05/25/2016 10.2* 4.6 - 6.5 % Final   Glycemic Control Guidelines for People with Diabetes:Non Diabetic:  <6%Goal of Therapy:  <7%Additional Action Suggested:  >8%   . Cholesterol 05/25/2016 95  0 - 200 mg/dL Final   ATP III Classification       Desirable:  < 200 mg/dL               Borderline High:  200 - 239 mg/dL          High:  > = 240 mg/dL  . Triglycerides 05/25/2016 307.0* 0.0 - 149.0 mg/dL Final   Normal:  <150 mg/dLBorderline High:  150 - 199 mg/dL  . HDL 05/25/2016 28.30* >39.00 mg/dL Final  . VLDL 05/25/2016 61.4* 0.0 - 40.0 mg/dL Final  . Total CHOL/HDL Ratio 05/25/2016 3   Final                  Men          Women1/2 Average Risk     3.4          3.3Average Risk          5.0          4.42X Average Risk          9.6          7.13X Average Risk          15.0          11.0                      . NonHDL 05/25/2016 66.47   Final   NOTE:  Non-HDL goal should be 30 mg/dL higher than patient's LDL goal (i.e. LDL goal of < 70 mg/dL, would have non-HDL goal of < 100 mg/dL)  . Direct LDL 05/25/2016 40.0   Final   Optimal:  <100 mg/dLNear or Above Optimal:  100-129 mg/dLBorderline High:  130-159 mg/dLHigh:  160-189 mg/dLVery High:  >190 mg/dL          Medication List       This list is accurate as of: 05/27/16 11:59 PM.  Always use your most recent med list.               amphetamine-dextroamphetamine 30 MG tablet  Commonly known as:  ADDERALL  Take 30 mg by mouth 2 (two) times daily.     atorvastatin 40 MG tablet  Commonly known as:  LIPITOR  Take 1 tablet (40 mg total) by mouth daily at 6 PM.     canagliflozin 300 MG Tabs tablet  Commonly known as:  INVOKANA  Take 300 mg by mouth daily before breakfast.     cephALEXin 500 MG capsule  Commonly known as:  KEFLEX  Take 1 capsule by mouth daily.     CHANTIX 0.5 MG tablet  Generic drug:  varenicline  Take 1 mg by mouth daily. Reported on 05/27/2016     chlorproMAZINE 200 MG tablet  Commonly known as:  THORAZINE  Take 200 mg by mouth at bedtime.     colchicine 0.6 MG tablet  Take 0.6 mg by mouth daily.     cyclobenzaprine 10 MG tablet    Commonly known as:  FLEXERIL  Take 10 mg by mouth at bedtime as needed for muscle spasms.     diazepam 10 MG tablet  Commonly known as:  VALIUM  Take 10 mg by mouth at bedtime as needed for anxiety or sleep.     doxycycline 100 MG tablet  Commonly known as:  VIBRA-TABS  Take 1 tablet (100 mg total) by mouth 2 (two) times daily.     esomeprazole 40 MG capsule  Commonly known as:  NEXIUM  Take 40 mg by mouth daily.     fenofibrate 145 MG tablet  Commonly known as:  TRICOR  TAKE 1 TABLET (145 MG TOTAL) BY MOUTH DAILY.     fluconazole 150 MG tablet  Commonly known as:  DIFLUCAN  Take 1 tablet (150 mg total) by mouth once.     freestyle lancets  Use as instructed to check blood sugar 3 times per day dx code E11.65     furosemide 20 MG tablet  Commonly known as:  LASIX  TAKE 1 TABLET BY MOUTH AS NEEDED     glucose blood test strip  Commonly known as:  FREESTYLE LITE  Use as instructed to check blood sugar 3 times per day dx code E11.65     insulin aspart protamine - aspart (70-30) 100 UNIT/ML FlexPen  Commonly known as:  NOVOLOG MIX 70/30 FLEXPEN  Inject 30 units in the morning and 20 units in the evening.     ketoconazole 2 % cream  Commonly known as:  NIZORAL  Apply 1 application topically daily as needed for irritation.     oxyCODONE 5 MG immediate release tablet  Commonly known as:  Oxy IR/ROXICODONE  Take 5 mg by mouth 2 (two) times daily as needed for moderate pain or severe pain.     oxyCODONE-acetaminophen 10-325 MG tablet  Commonly known as:  PERCOCET  Take 1 tablet by mouth 3 (three) times daily as needed for pain.     phentermine 37.5 MG tablet  Commonly known as:  ADIPEX-P  Take 37.5 mg by mouth daily as needed (appetite control). Reported on 05/27/2016     potassium chloride SA 20 MEQ tablet  Commonly known as:  K-DUR,KLOR-CON  Take 1 tablet (20 mEq total) by mouth daily.     rizatriptan 10 MG tablet  Commonly known as:  MAXALT  Take 1 tablet (10 mg  total) by mouth as needed for migraine. May repeat in 2 hours if needed     spironolactone 100 MG tablet  Commonly known as:  ALDACTONE  Take 1 tablet (100 mg total) by mouth daily.     tamsulosin 0.4 MG Caps capsule  Commonly known as:  FLOMAX  Take 0.4 mg by mouth daily.     TRULICITY A999333 0000000 Sopn  Generic drug:  Dulaglutide  Inject 5 mLs as directed once a week.     vortioxetine HBr 5 MG Tabs  Commonly known as:  TRINTELLIX  Take 5 mg by mouth daily.     TRINTELLIX 20 MG Tabs  Generic drug:  vortioxetine HBr  Take 20 mg by mouth daily.        Allergies:  Allergies  Allergen Reactions  . Latex Itching, Rash and Other (See Comments)    Pt states she  cannot use condoms - cause an infection.  Use of latex on skin is okay.  Tape causes rash  . Ibuprofen Other (See Comments)    headaches  . Sweetening Enhancer [Flavoring Agent] Nausea And Vomiting    And headaches  . Tetracyclines & Related Nausea And Vomiting    Yeast infections  . Trazodone And Nefazodone Other (See Comments)    Hallucinations   . Triazolam Other (See Comments)    hallucinations  . Aspartame And Phenylalanine Nausea And Vomiting    headaches    Past Medical History  Diagnosis Date  . Hypertension   . Hyperlipemia   . GERD (gastroesophageal reflux disease)   . Insomnia   . SUI (stress urinary incontinence, female) S/P SLING 12-29-2011  . SOB (shortness of breath) on exertion   . Bipolar disorder ([place])   . Neurogenic bladder   . Hypercholesterolemia   . Gastroparesis   . Ulcer   . MRSA (methicillin resistant Staphylococcus aureus)   . Chronic pain   . Edema   . Chest pain     a. 2008 Cath: normal cors;  b. 12/2013 Lexi CL: EF 47%, no ischemia/infarct.  . Anxiety   . Vertigo   . COPD (chronic obstructive pulmonary disease) ([place])   . Cholecystitis   . Hypersomnia   . Tobacco abuse   . Claudication ([place])     a. 12/2013 ABI's: R 0.97, L 0.94.  [name] Palpitations   . Anginal pain ([place])    . Pneumonia     "twice, I think" (02/12/2014)  . Sleep apnea     didn't tolerate cpap (02/12/2014)  . Type II diabetes mellitus ([place])   . History of blood transfusion 1986    "related to lost a child" (02/12/2014)  . H/O hiatal hernia   . [id number])     "weekly" (02/12/2014)  . Stroke ([place]) 02/12/2014    "eyesight is messed up; not steady on my feet" (02/12/2014)  . Arthritis     "qwhere" (02/12/2014)  . DJD (degenerative joint disease)   . Chronic back pain   . Depression   . Personality disorder   . OSA (obstructive sleep apnea) 07/04/2014    Past Surgical History  Procedure Laterality Date  . Knee arthroscopy w/ allograft impant Left     graft x 2  . Foot surgery Bilateral   . Anterior cervical decomp/discectomy fusion  2000    C6 - 7  . Pubovaginal sling  12/29/2011    Procedure: Gaynelle Arabian;  Surgeon: Reece Packer, MD;  Location: [facility];  Service: Urology;  Laterality: N/A;  cysto and sparc sling   . Lumbar fusion      "cage in my spine"  . Total abdominal hysterectomy w/ bilateral salpingoophorectomy  1997  . Multiple laparoscopies for endometriosis    . Cesarean section  1989; 1992  . Repeat reconstruction acl left knee/ screws removed  03-28-2000    CADAVER GRAFT  . 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 Knee surgery      TOTAL 8 SURG'S  . Cystoscopy with injection  05/04/2012    Procedure: CYSTOSCOPY WITH INJECTION;  Surgeon: Reece Packer, MD;  [facility];  Service: Urology;  Laterality: N/A;  MACROPLASTIQUE INJECTION  . Cystoscopy with injection  08/28/2012    Procedure: CYSTOSCOPY WITH INJECTION;  Surgeon: Reece Packer, MD;  Location: [facility];  Service: Urology;  Laterality: N/A;  cysto and  macroplastique   . Carpal tunnel release Right 04-25-2013  . Cysto N/A 04/30/2013    Procedure: CYSTOSCOPY;  Surgeon: Reece Packer, MD;  Location: [facility];  Service: Urology;   Laterality: N/A;  . Pubovaginal sling N/A 04/30/2013    Procedure: REMOVAL OF VAGINAL MESH;  Surgeon: Reece Packer, MD;  Location: [facility];  Service: Urology;  Laterality: N/A;  . Abdominal hysterectomy    . Hernia repair  ?1996    "stomach"  . Cardiac catheterization  05-22-2008   DR SKAINS    NO SIGNIFECANT CAD/ NORMAL LV/  EF 65%/  NO WALL MOTION ABNORMALITIES  . Cardiac catheterization  02/12/2014  . Left heart catheterization with coronary angiogram N/A 02/12/2014    Procedure: LEFT HEART CATHETERIZATION WITH CORONARY ANGIOGRAM;  Surgeon: Candee Furbish, MD;  Location: [facility] C[facility];  Service: Cardiovascular;  Laterality: N/A;  . Laparoscopic sigmoid colectomy  04/22/2015  . Laparoscopic sigmoid colectomy N/A 04/22/2015    Procedure: LAPAROSCOPIC HAND ASSISTED SIGMOID COLECTOMY;  Surgeon: Erroll Luna, MD;  Location: [facility];  Service: General;  Laterality: N/A;    Family History  Problem Relation Age of Onset  . Hypertension Mother   . Diabetes Mother   . Cancer - Other Mother     lymphoma   . Cancer - Other Father     lung, bladder cancer   . Heart attack Father   . Cancer - Other Brother     bladder cancer     Social History:  reports that she has been smoking Cigarettes and E-cigarettes.  She has a 41 pack-year smoking history. She has never used smokeless tobacco. She reports that she drinks alcohol. She reports that she does not use illicit drugs.  Review of Systems -   She is being treated by the podiatrist for persistent foot ulcer, No infection recently  She has history of high triglycerides treated with fenofibrate   Currently she is taking Lipitor 40 mg for high LDL  Lab Results  Component Value Date   CHOL 95 05/25/2016   HDL 28.30* 05/25/2016   LDLCALC 41 01/01/2016   LDLDIRECT 40.0 05/25/2016   TRIG 307.0* 05/25/2016   CHOLHDL 3 05/25/2016    Hypertension: This has been relatively mild and controlled . She is Currently taking  spironolactone  HYPOKALEMIA: She is taking Lasix from her PCP and is usually taking it daily for facial and pedal edema Continues to be hypokalemic despite taking potassium supplement  Lab Results  Component Value Date   CREATININE 0.85 05/25/2016   BUN 8 05/25/2016   NA 132* 05/25/2016   K 3.3* 05/25/2016   CL 99 05/25/2016   CO2 25 05/25/2016     Painful neuropathy: She has had persistent symptoms including leg pains and numbness as well as difficulty with balance and has tried various drugs including gabapentin without much relief  Foot exam shows absent pedal pulses and distal sensation   Examination:   BP 130/80 mmHg  Pulse 90  Temp(Src) 97.7 F (36.5 C)  Resp 16  Ht 5\' 5"  (1.651 m)  Wt 210 lb 6.4 oz (95.437 kg)  BMI 35.01 kg/m2  SpO2 98%  Body mass index is 35.01 kg/(m^2).   Mild  facial puffiness present No edema present.   Assesment/Plan:   1. Diabetes type 2, with persistently poor control, BMI 35  She is not getting consistent coverage of her hyperglycemia especially for meals and drinks with sugar, recently juices Also probably not getting  adequate insulin especially with being poorly compliant with her evening insulin dose The patient reports noncompliance with her evening insulin as well as with diet and now drinking large amounts of juice She has been unmotivated to check her blood sugar also Unlikely to be benefiting from Trulicity as her [id number] is still very high and her main problem is insulin deficiency now  Recommendations:  Regular glucose monitoring  Discussed doing the V-go pump again since this had benefited her before and would provide her with optimal insulin doses that better compliance and she could use her arm as she previously had discomfort in her abdomen  She will be instructed by the nurse educator to restart this, most likely will need 6-8 units of insulin to cover her meals and large amounts of carbohydrates or juices  Continue  Invokana  Most likely does not need to continue Trulicity as she is not benefiting with better blood sugar control or weight loss and she can stop  Reduce amounts of juice is at least 50%  Needs a short-term follow-up after starting the V-go pump avoiding drinks   She will take correction doses of 2 units for glucose over 150, 4 units if over 200 and 6 units if over 300  2. Hypertension: Blood pressure is  normal  However she is getting hypokalemic and will need to increase potassium to twice a day and try to reduce Lasix  3.  Continue follow-up with podiatrist for foot ulcer, again discussed importance of blood sugar control to prevent worsening of infection   Patient Instructions  Cut Juices in 1/2   Check blood sugars on waking up 4  times a week and before meals Also check blood sugars about 2 hours after a meal and do this after different meals by rotation  Recommended blood sugar levels on waking up is 90-130 and about 2 hours after meal is 130-160  Please bring your blood sugar monitor to each visit, thank you      Counseling time on subjects discussed above is over 50% of today's 25 minute visit   Fateh Kindle 05/29/2016, 11:13 AM

## 2016-05-27 NOTE — Patient Instructions (Signed)
Cut Juices in 1/2   Check blood sugars on waking up 4  times a week and before meals Also check blood sugars about 2 hours after a meal and do this after different meals by rotation  Recommended blood sugar levels on waking up is 90-130 and about 2 hours after meal is 130-160  Please bring your blood sugar monitor to each visit, thank you

## 2016-05-30 ENCOUNTER — Other Ambulatory Visit: Payer: Self-pay | Admitting: Endocrinology

## 2016-05-31 ENCOUNTER — Encounter: Payer: Self-pay | Admitting: Podiatry

## 2016-05-31 ENCOUNTER — Ambulatory Visit (INDEPENDENT_AMBULATORY_CARE_PROVIDER_SITE_OTHER): Payer: Medicare Other | Admitting: Podiatry

## 2016-05-31 VITALS — BP 123/84 | HR 95 | Resp 14

## 2016-05-31 DIAGNOSIS — L89891 Pressure ulcer of other site, stage 1: Secondary | ICD-10-CM

## 2016-05-31 DIAGNOSIS — L089 Local infection of the skin and subcutaneous tissue, unspecified: Secondary | ICD-10-CM

## 2016-05-31 DIAGNOSIS — L97501 Non-pressure chronic ulcer of other part of unspecified foot limited to breakdown of skin: Principal | ICD-10-CM

## 2016-05-31 DIAGNOSIS — E11621 Type 2 diabetes mellitus with foot ulcer: Secondary | ICD-10-CM

## 2016-05-31 NOTE — Progress Notes (Signed)
Subjective:     Patient ID: [name], female   DOB: 05-Jan-1966, 50 y.o.   MRN: [id number]  HPI this patient returns to the office for continued evaluation of a diabetic ulcer under the big toe joint of the right foot. She has also been diagnosed with a second ulcer on the inside of her fifth toe right foot.  She walks into the office wearing her surgical shoe.  Upon sitting in the treatment chair it was noted that there was large  bandage on the ulcers on her right foot.  She presents the office today for further evaluation and treatment. This patient is a diabetic who is under poor control. She has significant neuropathy to both her feet.    Review of Systems     Objective:   Physical Exam GENERAL APPEARANCE: Alert, conversant. Appropriately groomed. No acute distress.  VASCULAR: Pedal pulses are  palpable at  [facility] and PT bilateral.  Capillary refill time is immediate to all digits,  Normal temperature gradient.    NEUROLOGIC: sensation is absent  to 5.07 monofilament at 5/5 sites bilateral.  Light touch is intact bilateral, Muscle strength normal.  MUSCULOSKELETAL: acceptable muscle strength, tone and stability bilateral.  Intrinsic muscluature intact bilateral.  Rectus appearance of foot and digits noted bilateral.   DERMATOLOGIC: there is  an ulcer under the first MPJ of the right foot that measures 15  x 10 mm. There is no evidence of redness, swelling or drainage noted from the plantar ulcer nor at the site dorsomedial blister right foot. There is significant necrotic tissue right foot.. There is a ulcer formed on the dorsal aspect of the fifth toe right foot. There is 3 x 3  mm ulcer on the dorsal aspect of the fifth toe right. This ulcer is clean and is not infected at this time.The ulcer has dramatically healed except small ulcer noted at the dorsolateral aspect fifth toe right foot.      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       Plan:    ROV  After  measurement of the ulcers was performed on both ulcers. The ulcer under big toe joint has enlarged . [name] The ulcer is under the first MPJ and the top of the fifth toe right foot were debrided  and bandaged with Neosporin and dry sterile dressing. . I told this patient that she he needs to soak and take better care of her ulcers through bandaging. She is to return the office in 2 weeks for further evaluation and treatment.  I told this patient that we may need to refer her to Dr. Jacqualyn Posey for him to evaluate and care for the diabetic ulcer under the ball of the right foot.   Gardiner Barefoot DPM    Gardiner Barefoot DPM

## 2016-06-01 ENCOUNTER — Encounter: Payer: Medicare Other | Attending: Endocrinology | Admitting: Nutrition

## 2016-06-01 DIAGNOSIS — E1165 Type 2 diabetes mellitus with hyperglycemia: Secondary | ICD-10-CM

## 2016-06-01 DIAGNOSIS — Z029 Encounter for administrative examinations, unspecified: Secondary | ICD-10-CM | POA: Diagnosis not present

## 2016-06-01 NOTE — Patient Instructions (Signed)
Stop 70 /30 insulin Fill and apply a new V-Go once a day-every 24 hours.

## 2016-06-01 NOTE — Progress Notes (Signed)
She was retrained on how to fill, apply and use the V-Go She was given another starter kit of V-go 40s and written instructions were given to take 3-4 button presses before meals and a correction dose of 1 extra button press if blood sugar is over 150, 2 extra button presses if blood sugar is over 200, and 3 extra button presses if blood sugar is over 300.  She reported good understanding of this. She filled 2 V-go with insulin without any difficulty.  She will not apply it until tomorrow morning.  She took her 70/30 insulin just 2 hours ago (1PM).  She reported good understanding of this.   She also reverbalized that she will stop the 70/30 insulin, but continue the Trulicity.   She had no final questions.

## 2016-06-14 ENCOUNTER — Telehealth: Payer: Self-pay | Admitting: Endocrinology

## 2016-06-14 ENCOUNTER — Ambulatory Visit: Payer: Medicare Other | Admitting: Podiatry

## 2016-06-14 NOTE — Telephone Encounter (Signed)
Pt needs VGo Cartridges and insulin vials sent into the [facility] [address] [phone] Also requests call back from nurse.

## 2016-06-16 NOTE — Telephone Encounter (Signed)
Patient stated that she is totally out of her Vgo Cartridges and insulin vials, please advise when ready

## 2016-06-16 NOTE — Telephone Encounter (Signed)
Could you please advise on the note below. I could not locate the v-go or insulin for the device on the current med list. Thanks!

## 2016-06-16 NOTE — Telephone Encounter (Signed)
She will use the 40 unit V-go pump with insulin up to 76 units a day

## 2016-06-17 MED ORDER — V-GO 40 KIT: PACK | Status: DC

## 2016-06-17 MED ORDER — INSULIN ASPART 100 UNIT/ML ~~LOC~~ SOLN: SUBCUTANEOUS | Status: DC

## 2016-06-17 NOTE — Telephone Encounter (Signed)
Just wanted to clarify. Should the novolog 70/30 be sent to go in the pt's v-go pump? This is the insulin on current medication list for the pt.

## 2016-06-17 NOTE — Telephone Encounter (Signed)
Rx's submitted per pt's request.

## 2016-06-17 NOTE — Telephone Encounter (Signed)
Novolog for the pump, not 70/30

## 2016-06-21 ENCOUNTER — Encounter: Payer: Self-pay | Admitting: Sports Medicine

## 2016-06-21 ENCOUNTER — Emergency Department (HOSPITAL_COMMUNITY)
Admission: EM | Admit: 2016-06-21 | Discharge: 2016-06-22 | Disposition: A | Discharge status: Home / Self Care | Payer: Medicare Other | Attending: Emergency Medicine | Admitting: Emergency Medicine

## 2016-06-21 ENCOUNTER — Encounter (HOSPITAL_COMMUNITY): Payer: Self-pay | Admitting: Nurse Practitioner

## 2016-06-21 ENCOUNTER — Ambulatory Visit (INDEPENDENT_AMBULATORY_CARE_PROVIDER_SITE_OTHER): Payer: Medicare Other | Admitting: Sports Medicine

## 2016-06-21 ENCOUNTER — Ambulatory Visit (INDEPENDENT_AMBULATORY_CARE_PROVIDER_SITE_OTHER): Payer: Medicare Other

## 2016-06-21 ENCOUNTER — Telehealth: Payer: Self-pay | Admitting: *Deleted

## 2016-06-21 DIAGNOSIS — L89891 Pressure ulcer of other site, stage 1: Secondary | ICD-10-CM

## 2016-06-21 DIAGNOSIS — J449 Chronic obstructive pulmonary disease, unspecified: Secondary | ICD-10-CM | POA: Insufficient documentation

## 2016-06-21 DIAGNOSIS — L03115 Cellulitis of right lower limb: Secondary | ICD-10-CM

## 2016-06-21 DIAGNOSIS — E119 Type 2 diabetes mellitus without complications: Secondary | ICD-10-CM | POA: Insufficient documentation

## 2016-06-21 DIAGNOSIS — Z8673 Personal history of transient ischemic attack (TIA), and cerebral infarction without residual deficits: Secondary | ICD-10-CM | POA: Insufficient documentation

## 2016-06-21 DIAGNOSIS — M79671 Pain in right foot: Secondary | ICD-10-CM

## 2016-06-21 DIAGNOSIS — Z794 Long term (current) use of insulin: Secondary | ICD-10-CM | POA: Insufficient documentation

## 2016-06-21 DIAGNOSIS — Z79899 Other long term (current) drug therapy: Secondary | ICD-10-CM | POA: Diagnosis not present

## 2016-06-21 DIAGNOSIS — L02619 Cutaneous abscess of unspecified foot: Secondary | ICD-10-CM

## 2016-06-21 DIAGNOSIS — E11621 Type 2 diabetes mellitus with foot ulcer: Secondary | ICD-10-CM

## 2016-06-21 DIAGNOSIS — M79661 Pain in right lower leg: Secondary | ICD-10-CM

## 2016-06-21 DIAGNOSIS — F17210 Nicotine dependence, cigarettes, uncomplicated: Secondary | ICD-10-CM | POA: Diagnosis not present

## 2016-06-21 DIAGNOSIS — I10 Essential (primary) hypertension: Secondary | ICD-10-CM | POA: Diagnosis not present

## 2016-06-21 DIAGNOSIS — Z91040 Latex allergy status: Secondary | ICD-10-CM | POA: Diagnosis not present

## 2016-06-21 DIAGNOSIS — L97519 Non-pressure chronic ulcer of other part of right foot with unspecified severity: Secondary | ICD-10-CM

## 2016-06-21 DIAGNOSIS — L03119 Cellulitis of unspecified part of limb: Secondary | ICD-10-CM

## 2016-06-21 DIAGNOSIS — Z96652 Presence of left artificial knee joint: Secondary | ICD-10-CM | POA: Insufficient documentation

## 2016-06-21 DIAGNOSIS — E11610 Type 2 diabetes mellitus with diabetic neuropathic arthropathy: Secondary | ICD-10-CM

## 2016-06-21 DIAGNOSIS — M79662 Pain in left lower leg: Secondary | ICD-10-CM | POA: Diagnosis present

## 2016-06-21 MED ORDER — ACETAMINOPHEN-CODEINE #3 300-30 MG PO TABS: 1.0000 | ORAL_TABLET | Freq: Four times a day (QID) | ORAL | Status: DC | PRN

## 2016-06-21 MED ORDER — FLUCONAZOLE 150 MG PO TABS: 150.0000 mg | ORAL_TABLET | Freq: Two times a day (BID) | ORAL | Status: DC

## 2016-06-21 MED ORDER — AMOXICILLIN-POT CLAVULANATE 875-125 MG PO TABS: 1.0000 | ORAL_TABLET | Freq: Two times a day (BID) | ORAL | Status: DC

## 2016-06-21 NOTE — Progress Notes (Addendum)
Patient ID: LASHE OLIVEIRA, female   DOB: [birth year], 50 y.o.   MRN: [id number] Subjective: MONTE BRONDER is a 50 y.o. female patient seen in office for evaluation of ulceration of the right foot. Patient states that she has been dealing with this for 6 months. Was previously treated for Gout with no resolution with colchicine. Patient has a history of diabetes and a blood glucose level not recorded. Patient is changing the dressing using triple antibiotic cream. Denies nausea/fever/vomiting/night sweats/shortness of breath. Admits leg and foot pain and chills with significant swelling. Patient has no other pedal complaints at this time.  Patient Active Problem List   Diagnosis Date Noted  . Cancer of sigmoid colon ([place]) 03/09/2015  . OSA (obstructive sleep apnea) 07/04/2014  . Migraine without aura, with intractable migraine, so stated, without mention of status migrainosus 03/26/2014  . Peripheral neuropathy ([place]) 03/03/2014  . Abnormal stress test 02/12/2014  . CVA (cerebral infarction) 02/12/2014  . Chest pain   . Abnormal heart rhythm   . Edema   . Hypertension   . Hyperlipemia   . Hypercholesterolemia   . Other and unspecified hyperlipidemia 11/14/2013  . Type II or unspecified type diabetes mellitus without mention of complication, uncontrolled 07/22/2013  . Unspecified essential hypertension 07/22/2013  . 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 Diabetic polyneuropathy ([place]) 07/22/2013   Current Outpatient Prescriptions on File Prior to Visit  Medication Sig Dispense Refill  . amphetamine-dextroamphetamine (ADDERALL) 30 MG tablet Take 30 mg by mouth 2 (two) times daily.   0  . atorvastatin (LIPITOR) 40 MG tablet Take 1 tablet (40 mg total) by mouth daily at 6 PM. 90 tablet 3  . cephALEXin (KEFLEX) 500 MG capsule Take 1 capsule by mouth daily.    . CHANTIX 0.5 MG tablet Take 1 mg by mouth daily. Reported on 05/27/2016     . chlorproMAZINE (THORAZINE) 200 MG tablet Take 200 mg by mouth at bedtime.    . colchicine 0.6 MG tablet Take 0.6 mg by mouth daily.    . cyclobenzaprine (FLEXERIL) 10 MG tablet Take 10 mg by mouth at bedtime as needed for muscle spasms.   0  . diazepam (VALIUM) 10 MG tablet Take 10 mg by mouth at bedtime as needed for anxiety or sleep.     [name] doxycycline (VIBRA-TABS) 100 MG tablet Take 1 tablet (100 mg total) by mouth 2 (two) times daily. 20 tablet 0  . esomeprazole (NEXIUM) 40 MG capsule Take 40 mg by mouth daily.     . fenofibrate (TRICOR) 145 MG tablet TAKE 1 TABLET (145 MG TOTAL) BY MOUTH DAILY. 30 tablet 5  . furosemide (LASIX) 20 MG tablet TAKE 1 TABLET BY MOUTH AS NEEDED 15 tablet 1  . glucose blood (FREESTYLE LITE) test strip Use as instructed to check blood sugar 3 times per day dx code E11.65 100 each 5  . insulin aspart (NOVOLOG) 100 UNIT/ML injection Use per v-go pump 76 units daily 30 mL 2  . insulin aspart protamine - aspart (NOVOLOG MIX 70/30 FLEXPEN) (70-30) 100 UNIT/ML FlexPen Inject 30 units in the morning and 20 units in the evening. 15 mL 5  . Insulin Disposable Pump (V-GO 40) KIT Use 1 device per day. 1 kit 3  . INVOKANA 300 MG TABS tablet TAKE 300 MG BY MOUTH DAILY BEFORE BREAKFAST. 30 tablet 3  . ketoconazole (NIZORAL) 2 % cream Apply 1 application topically daily as needed for irritation.     [name]  Lancets (FREESTYLE) lancets Use as instructed to check blood sugar 3 times per day dx code E11.65 100 each 5  . oxyCODONE (OXY IR/ROXICODONE) 5 MG immediate release tablet Take 5 mg by mouth 2 (two) times daily as needed for moderate pain or severe pain.     [name] oxyCODONE-acetaminophen (PERCOCET) 10-325 MG per tablet Take 1 tablet by mouth 3 (three) times daily as needed for pain.    . phentermine (ADIPEX-P) 37.5 MG tablet Take 37.5 mg by mouth daily as needed (appetite control). Reported on 05/27/2016  0  . potassium chloride SA (K-DUR,KLOR-CON) 20 MEQ tablet Take 1 tablet (20 mEq  total) by mouth daily. 30 tablet 3  . rizatriptan (MAXALT) 10 MG tablet Take 1 tablet (10 mg total) by mouth as needed for migraine. May repeat in 2 hours if needed 10 tablet 3  . spironolactone (ALDACTONE) 100 MG tablet Take 1 tablet (100 mg total) by mouth daily. 30 tablet 5  . tamsulosin (FLOMAX) 0.4 MG CAPS capsule Take 0.4 mg by mouth daily.    . TRULICITY 6.94 WN/4.6EV SOPN Inject 5 mLs as directed once a week.     . Vortioxetine HBr (TRINTELLIX) 20 MG TABS Take 20 mg by mouth daily.     . Vortioxetine HBr 5 MG TABS Take 5 mg by mouth daily.     No current facility-administered medications on file prior to visit.   Allergies  Allergen Reactions  . Latex Itching, Rash and Other (See Comments)    Pt states she cannot use condoms - cause an infection.  Use of latex on skin is okay.  Tape causes rash  . Ibuprofen Other (See Comments)    headaches  . Sweetening Enhancer [Flavoring Agent] Nausea And Vomiting    And headaches  . Tetracyclines & Related Nausea And Vomiting    Yeast infections  . Trazodone And Nefazodone Other (See Comments)    Hallucinations   . Triazolam Other (See Comments)    hallucinations  . Aspartame And Phenylalanine Nausea And Vomiting    headaches    Recent Results (from the past 2160 hour(s))  Basic metabolic panel     Status: Abnormal   Collection Time: 03/29/16  2:38 PM  Result Value Ref Range   Sodium 130 (L) 135 - 145 mEq/L   Potassium 4.5 3.5 - 5.1 mEq/L   Chloride 96 96 - 112 mEq/L   CO2 24 19 - 32 mEq/L   Glucose, Bld 605 Repeated and verified X2. (HH) 70 - 99 mg/dL   BUN 13 6 - 23 mg/dL   Creatinine, Ser 0.98 0.40 - 1.20 mg/dL   Calcium 8.9 8.4 - 10.5 mg/dL   GFR 63.63 >60.00 mL/min  Fructosamine     Status: Abnormal   Collection Time: 03/29/16  2:40 PM  Result Value Ref Range   Fructosamine 316 (H) 0 - 285 umol/L    Comment: Published reference interval for apparently healthy subjects between age 19 and 36 is 25 - 285 umol/L and in a  poorly controlled diabetic population is 228 - 563 umol/L with a mean of 396 umol/L.   CEA     Status: None   Collection Time: 03/31/16  2:35 PM  Result Value Ref Range   CEA 3.4 0.0 - 4.7 ng/mL    Comment:                       Roche ECLIA methodology       Nonsmokers  <  3.9                                                     Smokers     <5.6   CBC with Differential     Status: Abnormal   Collection Time: 03/31/16  2:35 PM  Result Value Ref Range   WBC 12.0 (H) 3.9 - 10.3 10e3/uL   NEUT# 8.3 (H) 1.5 - 6.5 10e3/uL   HGB 12.9 11.6 - 15.9 g/dL   HCT 38.3 34.8 - 46.6 %   Platelets 217 145 - 400 10e3/uL   MCV 91.2 79.5 - 101.0 fL   MCH 30.6 25.1 - 34.0 pg   MCHC 33.6 31.5 - 36.0 g/dL   RBC 4.20 3.70 - 5.45 10e6/uL   RDW 13.7 11.2 - 14.5 %   lymph# 3.1 0.9 - 3.3 10e3/uL   MONO# 0.5 0.1 - 0.9 10e3/uL   Eosinophils Absolute 0.0 0.0 - 0.5 10e3/uL   Basophils Absolute 0.1 0.0 - 0.1 10e3/uL   NEUT% 69.2 38.4 - 76.8 %   LYMPH% 25.6 14.0 - 49.7 %   MONO% 4.0 0.0 - 14.0 %   EOS% 0.2 0.0 - 7.0 %   BASO% 1.0 0.0 - 2.0 %  Comprehensive metabolic panel     Status: Abnormal   Collection Time: 03/31/16  2:35 PM  Result Value Ref Range   Sodium 134 (L) 136 - 145 mEq/L   Potassium 4.6 3.5 - 5.1 mEq/L   Chloride 95 (L) 98 - 109 mEq/L   CO2 27 22 - 29 mEq/L   Glucose 434 (H) 70 - 140 mg/dl    Comment: Glucose reference range is for nonfasting patients. Fasting glucose reference range is 70- 100.   BUN 13.7 7.0 - 26.0 mg/dL   Creatinine 1.3 (H) 0.6 - 1.1 mg/dL   Total Bilirubin 0.31 0.20 - 1.20 mg/dL   Alkaline Phosphatase 82 40 - 150 U/L   AST 25 5 - 34 U/L   ALT 27 0 - 55 U/L   Total Protein 7.0 6.4 - 8.3 g/dL   Albumin 3.5 3.5 - 5.0 g/dL   Calcium 9.8 8.4 - 10.4 mg/dL   Anion Gap 12 (H) 3 - 11 mEq/L   EGFR 50 (L) >90 ml/min/1.73 m2    Comment: eGFR is calculated using the CKD-EPI Creatinine Equation (2009)  CEA (Parallel Testing)     Status: Abnormal   Collection Time: 03/31/16  2:35  PM  Result Value Ref Range   CEA 2.9 (H) ng/mL    Comment: Reference Range:  Non-Smoker:  <2.5                   Smoker:      <5.0   This test was performed using the Siemens Chemiluminescent method. Values obtained from different assay methods cannot be used interchangeably. CEA levels, regardless of value, should not be interpreted as absolute evidence of the presence or absence of disease.     Basic metabolic panel     Status: Abnormal   Collection Time: 05/25/16  2:14 PM  Result Value Ref Range   Sodium 132 (L) 135 - 145 mEq/L   Potassium 3.3 (L) 3.5 - 5.1 mEq/L   Chloride 99 96 - 112 mEq/L   CO2 25 19 - 32 mEq/L   Glucose, Bld 363 (  H) 70 - 99 mg/dL   BUN 8 6 - 23 mg/dL   Creatinine, Ser 0.85 0.40 - 1.20 mg/dL   Calcium 8.9 8.4 - 10.5 mg/dL   GFR 74.94 >60.00 mL/min  Hemoglobin A1c     Status: Abnormal   Collection Time: 05/25/16  2:14 PM  Result Value Ref Range   Hgb A1c MFr Bld 10.2 (H) 4.6 - 6.5 %    Comment: Glycemic Control Guidelines for People with Diabetes:Non Diabetic:  <6%Goal of Therapy: <7%Additional Action Suggested:  >8%   Lipid panel     Status: Abnormal   Collection Time: 05/25/16  2:14 PM  Result Value Ref Range   Cholesterol 95 0 - 200 mg/dL    Comment: ATP III Classification       Desirable:  < 200 mg/dL               Borderline High:  200 - 239 mg/dL          High:  > = 240 mg/dL   Triglycerides 307.0 (H) 0.0 - 149.0 mg/dL    Comment: Normal:  <150 mg/dLBorderline High:  150 - 199 mg/dL   HDL 28.30 (L) >39.00 mg/dL   VLDL 61.4 (H) 0.0 - 40.0 mg/dL   Total CHOL/HDL Ratio 3     Comment:                Men          Women1/2 Average Risk     3.4          3.3Average Risk          5.0          4.42X Average Risk          9.6          7.13X Average Risk          15.0          11.0                       NonHDL 66.47     Comment: NOTE:  Non-HDL goal should be 30 mg/dL higher than patient's LDL goal (i.e. LDL goal of < 70 mg/dL, would have non-HDL goal of <  100 mg/dL)  LDL cholesterol, direct     Status: None   Collection Time: 05/25/16  2:14 PM  Result Value Ref Range   Direct LDL 40.0 mg/dL    Comment: Optimal:  <100 mg/dLNear or Above Optimal:  100-129 mg/dLBorderline High:  130-159 mg/dLHigh:  160-189 mg/dLVery High:  >190 mg/dL    Objective: There were no vitals filed for this visit.  General: Patient is awake, alert, oriented x 3 and in no acute distress.  Dermatology: Skin is warm and dry bilateral with a partial thickness ulceration present  Sub met 1 and medial aspect of 5th toe on right foot. Sub met 1 Ulceration measures 1 cm x 0.5 cm x 0.5cm and ulceration medial right 5 toe measures 0.2x0.2cm. There is a keratotic border with a granular base. The ulcerations do not probe to bone. There is no malodor, no active drainage, + erythema, + edema. No other acute signs of infection.   Vascular: Dorsalis Pedis pulse = 1/4 Bilateral,  Posterior Tibial pulse = 0/4 right due to edema, 1/4 on left,  Capillary Fill Time < 5 seconds  Neurologic: Protective sensation absent bilateral.  Musculosketal: There is decreased ankle joint range of motion Bilateral. There is  decreased Subtalar joint range of motion Bilateral. There is a decrease in 1st metatarsophalangeal joint range of motion Bilateral. No Pain with palpation to ulcerated areas. + pain with compression to calf on right with warmth and redness concerning for DVT. Pes planus foot type with concern for charcot on right.   Xrays, Right foot:Calcaneal spurs, Fracture at medial cuneiform with midtarsal collapse suggestive of charcot deformity, calcification of vessels, No bony erosions at ulcer sites sub met 1 and 5th toe suggestive of osteomyelitis. No gas in soft tissues. Significant swelling. No other acute findings.   Assessment and Plan:  Problem List Items Addressed This Visit    None    Visit Diagnoses    Right foot pain    -  Primary    Relevant Medications     amoxicillin-clavulanate (AUGMENTIN) 875-125 MG tablet    fluconazole (DIFLUCAN) 150 MG tablet    acetaminophen-codeine (TYLENOL #3) 300-30 MG tablet    Other Relevant Orders    DG Foot 2 Views Right    WOUND CULTURE    Diabetic ulcer of right foot associated with type 2 diabetes mellitus (HCC)        Relevant Medications    amoxicillin-clavulanate (AUGMENTIN) 875-125 MG tablet    fluconazole (DIFLUCAN) 150 MG tablet    acetaminophen-codeine (TYLENOL #3) 300-30 MG tablet    Other Relevant Orders    WOUND CULTURE    Cellulitis and abscess of foot        Relevant Medications    amoxicillin-clavulanate (AUGMENTIN) 875-125 MG tablet    fluconazole (DIFLUCAN) 150 MG tablet    acetaminophen-codeine (TYLENOL #3) 300-30 MG tablet    Other Relevant Orders    WOUND CULTURE    Diabetic Charcot's foot (HCC)        Relevant Medications    acetaminophen-codeine (TYLENOL #3) 300-30 MG tablet      -Examined patient and discussed the progression of the wounds and treatment alternatives. -Xrays reviewed concerning for charcot possibly acute in the setting of ulceration with likely infection -Excisionally dedbrided ulceration to healthy bleeding borders using a sterile chisel blade. -Applied antibiotic cream and dry sterile dressing and instructed patient to continue with daily dressings at home consisting of triple antibiotic and bandaid/dry sterile dressing. Wound culture obtained and sent to soltasis lab; will call patient with result if change in antibiotics is warranted  -Advised patient to go to the ER for venous doppler to r/o DVT -Recommend cont with post op shoe for ulcer and possible charcot event -Rx Augmentin to start if redness is not better after a few doses (3 days) recommend ER for IV antibiotics for cellulitis -Rx diflucan; patient history of yeast infection when on antibiotics -Rx Tylenol #3 for pain  -Patient to return to office in 2 weeks for follow up care and evaluation or sooner  if problems arise. Patient is in transit to ER Today for doppler as instructed. ER May call me directly at [phone] to discuss results.  Landis Martins, DPM

## 2016-06-21 NOTE — ED Notes (Addendum)
Pt sent from [place] [facility] for red, warm, swollen right calf to r/o DVT.

## 2016-06-21 NOTE — Telephone Encounter (Addendum)
Dr. Cannon Kettle states she would like to refer pt for venous doppler for right lower leg r/o DVT - right calf is red swollen, hard and painful.  I spoke with [facility] [phone] and she said she would be looking for pt. 07/05/2016-MRI orders faxed to [facility].

## 2016-06-21 NOTE — ED Notes (Signed)
Called vascular lab, unable to get to u/s tonight, will need scheduled for first thing in the 06-22-16 am.

## 2016-06-21 NOTE — ED Notes (Signed)
She reports she is being followed by her doctor and the foot center for a wound and fractures to R foot. Today she went for a routine appointment and they sent her to ED with concern for a blood clot due to redness, pain and swelling radiating into her calf. She is alert and breathing easily

## 2016-06-22 ENCOUNTER — Ambulatory Visit (HOSPITAL_BASED_OUTPATIENT_CLINIC_OR_DEPARTMENT_OTHER)
Admission: RE | Admit: 2016-06-22 | Discharge: 2016-06-22 | Disposition: A | Discharge status: Home / Self Care | Payer: Medicare Other | Source: Ambulatory Visit | Attending: Emergency Medicine | Admitting: Emergency Medicine

## 2016-06-22 DIAGNOSIS — R609 Edema, unspecified: Secondary | ICD-10-CM

## 2016-06-22 DIAGNOSIS — L03115 Cellulitis of right lower limb: Secondary | ICD-10-CM | POA: Diagnosis not present

## 2016-06-22 DIAGNOSIS — M79609 Pain in unspecified limb: Secondary | ICD-10-CM | POA: Diagnosis not present

## 2016-06-22 DIAGNOSIS — G4733 Obstructive sleep apnea (adult) (pediatric): Secondary | ICD-10-CM | POA: Insufficient documentation

## 2016-06-22 DIAGNOSIS — E785 Hyperlipidemia, unspecified: Secondary | ICD-10-CM | POA: Insufficient documentation

## 2016-06-22 DIAGNOSIS — J449 Chronic obstructive pulmonary disease, unspecified: Secondary | ICD-10-CM | POA: Insufficient documentation

## 2016-06-22 DIAGNOSIS — F319 Bipolar disorder, unspecified: Secondary | ICD-10-CM

## 2016-06-22 DIAGNOSIS — E1143 Type 2 diabetes mellitus with diabetic autonomic (poly)neuropathy: Secondary | ICD-10-CM | POA: Insufficient documentation

## 2016-06-22 DIAGNOSIS — I10 Essential (primary) hypertension: Secondary | ICD-10-CM | POA: Insufficient documentation

## 2016-06-22 DIAGNOSIS — M7989 Other specified soft tissue disorders: Secondary | ICD-10-CM

## 2016-06-22 DIAGNOSIS — M79604 Pain in right leg: Secondary | ICD-10-CM

## 2016-06-22 DIAGNOSIS — F419 Anxiety disorder, unspecified: Secondary | ICD-10-CM | POA: Insufficient documentation

## 2016-06-22 DIAGNOSIS — Z720 Tobacco use: Secondary | ICD-10-CM | POA: Insufficient documentation

## 2016-06-22 DIAGNOSIS — E7800 Pure hypercholesterolemia, unspecified: Secondary | ICD-10-CM

## 2016-06-22 DIAGNOSIS — K219 Gastro-esophageal reflux disease without esophagitis: Secondary | ICD-10-CM | POA: Insufficient documentation

## 2016-06-22 LAB — CBC WITH DIFFERENTIAL/PLATELET
Basophils Absolute: 0.1 10*3/uL (ref 0.0–0.1)
Basophils Relative: 1 %
Eosinophils Absolute: 0.1 10*3/uL (ref 0.0–0.7)
Eosinophils Relative: 1 %
HCT: 36.5 % (ref 36.0–46.0)
Hemoglobin: 12.1 g/dL (ref 12.0–15.0)
Lymphocytes Relative: 29 %
Lymphs Abs: 3.3 10*3/uL (ref 0.7–4.0)
MCH: 30 pg (ref 26.0–34.0)
MCHC: 33.2 g/dL (ref 30.0–36.0)
MCV: 90.6 fL (ref 78.0–100.0)
Monocytes Absolute: 0.5 10*3/uL (ref 0.1–1.0)
Monocytes Relative: 5 %
Neutro Abs: 7.1 10*3/uL (ref 1.7–7.7)
Neutrophils Relative %: 64 %
Platelets: 299 10*3/uL (ref 150–400)
RBC: 4.03 MIL/uL (ref 3.87–5.11)
RDW: 12.9 % (ref 11.5–15.5)
WBC: 11.1 10*3/uL — ABNORMAL HIGH (ref 4.0–10.5)

## 2016-06-22 LAB — BASIC METABOLIC PANEL
Anion gap: 7 (ref 5–15)
BUN: <5 mg/dL — ABNORMAL LOW (ref 6–20)
CO2: 26 mmol/L (ref 22–32)
Calcium: 9 mg/dL (ref 8.9–10.3)
Chloride: 102 mmol/L (ref 101–111)
Creatinine, Ser: 0.66 mg/dL (ref 0.44–1.00)
GFR calc Af Amer: >60 mL/min (ref >60)
GFR calc non Af Amer: >60 mL/min (ref >60)
Glucose, Bld: 208 mg/dL — ABNORMAL HIGH (ref 65–99)
Potassium: 3.5 mmol/L (ref 3.5–5.1)
Sodium: 135 mmol/L (ref 135–145)

## 2016-06-22 MED ORDER — SULFAMETHOXAZOLE-TRIMETHOPRIM 800-160 MG PO TABS: 2.0000 | ORAL_TABLET | Freq: Two times a day (BID) | ORAL | Status: AC

## 2016-06-22 NOTE — Discharge Instructions (Signed)
You will need to come to the hospital at 8:30 tomorrow morning for a lower extremity venous Doppler to rule out blood clot.  You will need to go to the short stay area and tell them that you were here for the venous duplex scan of your lower extremity

## 2016-06-22 NOTE — ED Notes (Signed)
Pt verbalized understanding of d/c instructions and has no further questions. Pt stable and NAD. Pt to come back here in the morning for vascular study. Pt stated "I have appointments tomorrow at 10am, 12pm, and 3pm. This RN emphasized the importance of completing vascular study and pt is going to reschedule her 10am appointment to come have vascular study done.

## 2016-06-22 NOTE — Progress Notes (Signed)
VASCULAR LAB PRELIMINARY  PRELIMINARY  PRELIMINARY  PRELIMINARY  Right lower extremity venous duplex has been completed.     Right:  No evidence of DVT, superficial thrombosis, or Baker's cyst.   Janifer Adie, RVT, RDMS 06/22/2016, 10:55 AM

## 2016-06-22 NOTE — ED Provider Notes (Signed)
CSN: [id number]     Arrival date & time 06/21/16  1808 History   First MD Initiated Contact with Patient 06/21/16 2312     Chief Complaint  Patient presents with  . Leg Pain     (Consider location/radiation/quality/duration/timing/severity/associated sxs/prior Treatment) HPI Patient presents to the emergency department with lower extremity redness and swelling.  The patient states she has been dealing with a wound on her foot over several months and then today he stated she was having pain in her doctor did an x-ray of it fell.  She has fractures in her foot and they were concerned about blood clot.  Patient states that nothing seems make her condition better, but palpation makes the pain worse. The patient denies chest pain, shortness of breath, headache,blurred vision, neck pain, fever, cough, weakness, numbness, dizziness, anorexia,  abdominal pain, nausea, vomiting, diarrhea, rash, back pain, dysuria, hematemesis, bloody stool, near syncope, or syncope. Past Medical History  Diagnosis Date  . Hypertension   . Hyperlipemia   . GERD (gastroesophageal reflux disease)   . Insomnia   . SUI (stress urinary incontinence, female) S/P SLING 12-29-2011  . SOB (shortness of breath) on exertion   . Bipolar disorder ([place])   . Neurogenic bladder   . Hypercholesterolemia   . Gastroparesis   . Ulcer   . MRSA (methicillin resistant Staphylococcus aureus)   . Chronic pain   . Edema   . Chest pain     a. 2008 Cath: normal cors;  b. 12/2013 Lexi CL: EF 47%, no ischemia/infarct.  . Anxiety   . Vertigo   . COPD (chronic obstructive pulmonary disease) ([place])   . Cholecystitis   . Hypersomnia   . Tobacco abuse   . Claudication ([place])     a. 12/2013 ABI's: R 0.97, L 0.94.  [name] Palpitations   . Anginal pain ([place])   . Pneumonia     "twice, I think" (02/12/2014)  . Sleep apnea     didn't tolerate cpap (02/12/2014)  . Type II diabetes mellitus ([place])   . History of blood transfusion 1986    "related to  lost a child" (02/12/2014)  . H/O hiatal hernia   . MIWOEHOZ(224.8)     "weekly" (02/12/2014)  . Stroke ([place]) 02/12/2014    "eyesight is messed up; not steady on my feet" (02/12/2014)  . Arthritis     "qwhere" (02/12/2014)  . DJD (degenerative joint disease)   . Chronic back pain   . Depression   . Personality disorder   . OSA (obstructive sleep apnea) 07/04/2014   Past Surgical History  Procedure Laterality Date  . Knee arthroscopy w/ allograft impant Left     graft x 2  . Foot surgery Bilateral   . Anterior cervical decomp/discectomy fusion  2000    C6 - 7  . Pubovaginal sling  12/29/2011    Procedure: Gaynelle Arabian;  Surgeon: Reece Packer, MD;  Location: [facility];  Service: Urology;  Laterality: N/A;  cysto and sparc sling   . Lumbar fusion      "cage in my spine"  . Total abdominal hysterectomy w/ bilateral salpingoophorectomy  1997  . Multiple laparoscopies for endometriosis    . Cesarean section  1989; 1992  . Repeat reconstruction acl left knee/ screws removed  03-28-2000    CADAVER GRAFT  . 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 Knee surgery      TOTAL 8 SURG'S  . Cystoscopy with injection  05/04/2012    Procedure: CYSTOSCOPY WITH INJECTION;  Surgeon: Reece Packer, MD;  Location: [facility];  Service: Urology;  Laterality: N/A;  MACROPLASTIQUE INJECTION  . Cystoscopy with injection  08/28/2012    Procedure: CYSTOSCOPY WITH INJECTION;  Surgeon: Reece Packer, MD;  Location: [facility];  Service: Urology;  Laterality: N/A;  cysto and macroplastique   . Carpal tunnel release Right 04-25-2013  . Cysto N/A 04/30/2013    Procedure: CYSTOSCOPY;  Surgeon: Reece Packer, MD;  Location: [facility];  Service: Urology;  Laterality: N/A;  . Pubovaginal sling N/A 04/30/2013    Procedure: REMOVAL OF VAGINAL MESH;  Surgeon: Reece Packer, MD;  Location: [facility];  Service: Urology;  Laterality: N/A;  .  Abdominal hysterectomy    . Hernia repair  ?1996    "stomach"  . Cardiac catheterization  05-22-2008   DR SKAINS    NO SIGNIFECANT CAD/ NORMAL LV/  EF 65%/  NO WALL MOTION ABNORMALITIES  . Cardiac catheterization  02/12/2014  . Left heart catheterization with coronary angiogram N/A 02/12/2014    Procedure: LEFT HEART CATHETERIZATION WITH CORONARY ANGIOGRAM;  Surgeon: Candee Furbish, MD;  Location: [facility] CATH LAB;  Service: Cardiovascular;  Laterality: N/A;  . Laparoscopic sigmoid colectomy  04/22/2015  . Laparoscopic sigmoid colectomy N/A 04/22/2015    Procedure: LAPAROSCOPIC HAND ASSISTED SIGMOID COLECTOMY;  Surgeon: Erroll Luna, MD;  Location: [facility];  Service: General;  Laterality: N/A;   Family History  Problem Relation Age of Onset  . Hypertension Mother   . Diabetes Mother   . Cancer - Other Mother     lymphoma   . Cancer - Other Father     lung, bladder cancer   . Heart attack Father   . Cancer - Other Brother     bladder cancer    Social History  Substance Use Topics  . Smoking status: Current Every Day Smoker -- 1.00 packs/day for 41 years    Types: Cigarettes, E-cigarettes  . Smokeless tobacco: Never Used  . Alcohol Use: 0.0 oz/week     Comment: 02/12/2014 "might have a mixed drink on special occasions"   OB History    No data available     Review of Systems  All other systems negative except as documented in the HPI. All pertinent positives and negatives as reviewed in the HPI.  Allergies  Latex; Ibuprofen; Sweetening enhancer; Tetracyclines & related; Trazodone and nefazodone; Triazolam; and Aspartame and phenylalanine  Home Medications   Prior to Admission medications   Medication Sig Start Date End Date Taking? Authorizing Provider  acetaminophen-codeine (TYLENOL #3) 300-30 MG tablet Take 1 tablet by mouth every 6 (six) hours as needed for moderate pain. 06/21/16   Landis Martins, DPM  amoxicillin-clavulanate (AUGMENTIN) 875-125 MG tablet Take 1 tablet by mouth 2  (two) times daily. 06/21/16   Titorya Stover, DPM  amphetamine-dextroamphetamine (ADDERALL) 30 MG tablet Take 30 mg by mouth 2 (two) times daily.  09/01/15   Historical Provider, MD  atorvastatin (LIPITOR) 40 MG tablet Take 1 tablet (40 mg total) by mouth daily at 6 PM. 11/17/15   Isaiah Serge, NP  cephALEXin (KEFLEX) 500 MG capsule Take 1 capsule by mouth daily. 03/22/16   Historical Provider, MD  CHANTIX 0.5 MG tablet Take 1 mg by mouth daily. Reported on 05/27/2016 08/03/15   Historical Provider, MD  chlorproMAZINE (THORAZINE) 200 MG tablet Take 200 mg by mouth at bedtime.    Historical Provider, MD  colchicine 0.6 MG tablet Take 0.6 mg by mouth daily.    Historical Provider, MD  cyclobenzaprine (FLEXERIL) 10 MG tablet Take 10 mg by mouth at bedtime as needed for muscle spasms.  11/03/15   Historical Provider, MD  diazepam (VALIUM) 10 MG tablet Take 10 mg by mouth at bedtime as needed for anxiety or sleep.  12/11/15   Historical Provider, MD  doxycycline (VIBRA-TABS) 100 MG tablet Take 1 tablet (100 mg total) by mouth 2 (two) times daily. 04/13/16   Gardiner Barefoot, DPM  esomeprazole (NEXIUM) 40 MG capsule Take 40 mg by mouth daily.     Historical Provider, MD  fenofibrate (TRICOR) 145 MG tablet TAKE 1 TABLET (145 MG TOTAL) BY MOUTH DAILY. 11/24/15   Elayne Snare, MD  fluconazole (DIFLUCAN) 150 MG tablet Take 1 tablet (150 mg total) by mouth 2 (two) times daily. 06/21/16   Landis Martins, DPM  furosemide (LASIX) 20 MG tablet TAKE 1 TABLET BY MOUTH AS NEEDED 05/16/16   Elayne Snare, MD  glucose blood (FREESTYLE LITE) test strip Use as instructed to check blood sugar 3 times per day dx code E11.65 12/17/15   Elayne Snare, MD  insulin aspart (NOVOLOG) 100 UNIT/ML injection Use per v-go pump 76 units daily 06/17/16   Elayne Snare, MD  insulin aspart protamine - aspart (NOVOLOG MIX 70/30 FLEXPEN) (70-30) 100 UNIT/ML FlexPen Inject 30 units in the morning and 20 units in the evening. 03/03/16   Elayne Snare, MD  Insulin  Disposable Pump (V-GO 40) KIT Use 1 device per day. 06/17/16   Elayne Snare, MD  INVOKANA 300 MG TABS tablet TAKE 300 MG BY MOUTH DAILY BEFORE BREAKFAST. 05/30/16   Elayne Snare, MD  ketoconazole (NIZORAL) 2 % cream Apply 1 application topically daily as needed for irritation.  12/16/15   Historical Provider, MD  Lancets (FREESTYLE) lancets Use as instructed to check blood sugar 3 times per day dx code E11.65 12/17/15   Elayne Snare, MD  oxyCODONE (OXY IR/ROXICODONE) 5 MG immediate release tablet Take 5 mg by mouth 2 (two) times daily as needed for moderate pain or severe pain.  11/23/15   Historical Provider, MD  oxyCODONE-acetaminophen (PERCOCET) 10-325 MG per tablet Take 1 tablet by mouth 3 (three) times daily as needed for pain.    Historical Provider, MD  phentermine (ADIPEX-P) 37.5 MG tablet Take 37.5 mg by mouth daily as needed (appetite control). Reported on 05/27/2016 09/23/15   Historical Provider, MD  potassium chloride SA (K-DUR,KLOR-CON) 20 MEQ tablet Take 1 tablet (20 mEq total) by mouth daily. 05/27/16   Elayne Snare, MD  rizatriptan (MAXALT) 10 MG tablet Take 1 tablet (10 mg total) by mouth as needed for migraine. May repeat in 2 hours if needed 06/11/14   Garvin Fila, MD  spironolactone (ALDACTONE) 100 MG tablet Take 1 tablet (100 mg total) by mouth daily. 12/17/15   Elayne Snare, MD  tamsulosin (FLOMAX) 0.4 MG CAPS capsule Take 0.4 mg by mouth daily. 11/21/15   Historical Provider, MD  TRULICITY 7.61 YW/7.3XT SOPN Inject 5 mLs as directed once a week.  02/26/16   Historical Provider, MD  Vortioxetine HBr (TRINTELLIX) 20 MG TABS Take 20 mg by mouth daily.     Historical Provider, MD  Vortioxetine HBr 5 MG TABS Take 5 mg by mouth daily.    Historical Provider, MD   BP 153/68 mmHg  Pulse 93  Temp(Src) 97.9 F (36.6 C) (Oral)  Resp 18  SpO2 99% Physical Exam  Constitutional:  She is oriented to person, place, and time. She appears well-developed and well-nourished. No distress.  HENT:  Head:  Normocephalic and atraumatic.  Mouth/Throat: Oropharynx is clear and moist.  Eyes: Pupils are equal, round, and reactive to light.  Neck: Normal range of motion. Neck supple.  Cardiovascular: Normal rate, regular rhythm and normal heart sounds.  Exam reveals no gallop and no friction rub.   No murmur heard. Pulmonary/Chest: Effort normal and breath sounds normal. No respiratory distress. She has no wheezes.  Abdominal: Soft. Bowel sounds are normal. She exhibits no distension. There is no tenderness.  Musculoskeletal: She exhibits edema.       Left lower leg: She exhibits tenderness.       Legs: Neurological: She is alert and oriented to person, place, and time. She exhibits normal muscle tone. Coordination normal.  Skin: Skin is warm and dry. No rash noted. No erythema.  Psychiatric: She has a normal mood and affect. Her behavior is normal.  Nursing note and vitals reviewed.   ED Course  Procedures (including critical care time) Labs Review Labs Reviewed  BASIC METABOLIC PANEL - Abnormal; Notable for the following:    Glucose, Bld 208 (*)    BUN <5 (*)    All other components within normal limits  CBC WITH DIFFERENTIAL/PLATELET - Abnormal; Notable for the following:    WBC 11.1 (*)    All other components within normal limits    Imaging Review Dg Foot 2 Views Right  06/21/2016  Xrays, Right foot:Calcaneal spurs, Fracture at medial cuneiform with midtarsal collapse suggestive of charcot deformity, calcification of vessels, No bony erosions at ulcer sites sub met 1 and 5th toe suggestive of osteomyelitis. No gas in soft tissues. Significant swelling. No other acute findings.   I have personally reviewed and evaluated these images and lab results as part of my medical decision-making.  We will order an outpatient study for blood clot, but feel this is less likely.  The appearance of her lower extremity redness is consistent with more of a cellulitis.  The redness is not diffuse  isolated to the anterior lower portion of her right leg.  Patient is advised plan and all questions were answered.  I advised her that we will give her antibiotics and she does not need take the Augmentin as I feel this is not going to cover her well enough for this cellulitis   Dalia Heading, PA-C 06/22/16 0142  Everlene Balls, MD 06/22/16 [phone]

## 2016-06-24 ENCOUNTER — Other Ambulatory Visit (INDEPENDENT_AMBULATORY_CARE_PROVIDER_SITE_OTHER): Payer: Medicare Other

## 2016-06-24 ENCOUNTER — Telehealth: Payer: Self-pay | Admitting: Sports Medicine

## 2016-06-24 DIAGNOSIS — Z794 Long term (current) use of insulin: Secondary | ICD-10-CM

## 2016-06-24 DIAGNOSIS — E1165 Type 2 diabetes mellitus with hyperglycemia: Secondary | ICD-10-CM

## 2016-06-24 LAB — BASIC METABOLIC PANEL
BUN: 6 mg/dL (ref 6–23)
CALCIUM: 9 mg/dL (ref 8.4–10.5)
CO2: 27 meq/L (ref 19–32)
Chloride: 101 mEq/L (ref 96–112)
Creatinine, Ser: 0.86 mg/dL (ref 0.40–1.20)
GFR: 73.91 mL/min (ref >60.00)
GLUCOSE: 224 mg/dL — AB (ref 70–99)
POTASSIUM: 3.3 meq/L — AB (ref 3.5–5.1)
Sodium: 138 mEq/L (ref 135–145)

## 2016-06-24 LAB — WOUND CULTURE
GRAM STAIN: NONE SEEN
GRAM STAIN: NONE SEEN
Gram Stain: NONE SEEN

## 2016-06-24 NOTE — Telephone Encounter (Signed)
Called to review results Wound culture negative Doppler negative for DVT Advised patient to take her PO antibiotics and if redness is not better in a few day to go to ER for IV abx -Dr. Cannon Kettle

## 2016-06-25 LAB — FRUCTOSAMINE: Fructosamine: 268 umol/L (ref 0–285)

## 2016-06-29 ENCOUNTER — Ambulatory Visit (INDEPENDENT_AMBULATORY_CARE_PROVIDER_SITE_OTHER): Payer: Medicare Other | Admitting: Endocrinology

## 2016-06-29 ENCOUNTER — Encounter: Payer: Self-pay | Admitting: Endocrinology

## 2016-06-29 VITALS — BP 118/82 | HR 98 | Ht 65.0 in | Wt 211.0 lb

## 2016-06-29 DIAGNOSIS — Z794 Long term (current) use of insulin: Secondary | ICD-10-CM

## 2016-06-29 DIAGNOSIS — E876 Hypokalemia: Secondary | ICD-10-CM | POA: Diagnosis not present

## 2016-06-29 DIAGNOSIS — E1165 Type 2 diabetes mellitus with hyperglycemia: Secondary | ICD-10-CM

## 2016-06-29 MED ORDER — SPIRONOLACTONE 50 MG PO TABS: 50.0000 mg | ORAL_TABLET | Freq: Every day | ORAL | Status: DC

## 2016-06-29 NOTE — Patient Instructions (Signed)
Click 1-2 times for juice, and drink less  Check blood sugars on waking up 2-3  times a week Also check blood sugars about 2 hours after a meal and do this after different meals by rotation  Recommended blood sugar levels on waking up is 90-130 and about 2 hours after meal is 130-160  Please bring your blood sugar monitor to each visit, thank you  Restart Spironolactone

## 2016-06-29 NOTE — Progress Notes (Signed)
Patient ID: [name], female   DOB: [birth year], 50 y.o.   MRN: [id number]   Reason for Appointment: Diabetes follow-up    History of Present Illness:   Diagnosis: Type 2 diabetes mellitus, date of diagnosis: 2004.  PAST history: She has been treated mostly with insulin since about a year after diagnosis. She has had difficulty with consistent compliance with diet and also compliance with self care including glucose monitoring over the years. She had been mostly treated with basal insulin. Also had been tried on mealtime insulin but she would be noncompliant with this. Was tried on Prandin for mealtime control but difficult to judge efficacy because of lack of postprandial monitoring. Was given Victoza to start in 2011 but did not follow up after this.She was tried on premixed insulin but this did not help her control, mostly because of noncompliance with the doses. She had been using the V- go pump and had better control initially and was better compliant with the daily routine and boluses. She has had frequent education visits also. She stopped using her V.-go pump in 6/15 because of discomfort at the site of application Also did not improve with a trial of Victoza   RECENT history:    INSULIN dose:  V-go 40 unit pump,  Mealtime bolus 4 clicks Non-insulin hypoglycemic drugs: Invokana 300 mg in the morning, Trulicity 3.57 mg weekly  Her A1c previously has been consistently under 7% and more recently blood sugars had been progressively worse A1c has increased this year and  Since it was 10.2 she was advised to start the V-go pump which she didn't early June  Current management and problems identified:  She has not checked her blood sugars much especially in the last 2-3 weeks    She had variable readings the first 2 weeks of June ranging from 96-495   She checks her blood sugar mostly in the early afternoon.  Yesterday her glucose was 135   She has not done any readings  fasting  Blood sugar nonfasting the lab was 224   Although she is not having difficulty using the insulin pump with local site discomfort as she had before not clear if her  Postprandial sugars are better    However her FRUCTOSAMINE is improved from usual levels   She continues to drink juices throughout the day which will increase her sugar   she is trying to do her bolus clicks with her meals , currently taking 8 units. Frequently will forget to take her bolus 4 eating and usually takes it right after  Glucometer:  FreeStyle.  Checking blood sugar  As above Blood Glucose readings:  Average for the last month 268 , range 96- 495   Hypoglycemia frequency: Never.    Food preferences: eating 1 or 2 meals per day, variable intake, sandwiches at times or otherwise snacks, dinner usually 8 PM  Physical activity: exercise: Minimal.  She has difficulties with leg pain Certified Diabetes Educator visit: Most recent:, 2/14.   The diet that the patient has been following is no specific diet; still eating at irregular times; still drinking drinks with sugar claiming she is allergic to artificial sweeteners   Wt Readings from Last 3 Encounters:  06/29/16 211 lb (95.709 kg)  05/27/16 210 lb 6.4 oz (95.437 kg)  05/16/16 210 lb 6.4 oz (95.437 kg)   DM labs:   Lab Results  Component Value Date   HGBA1C 10.2* 05/25/2016   HGBA1C 12.3* 02/29/2016  HGBA1C 8.4* 01/01/2016   Lab Results  Component Value Date   MICROALBUR <0.7 08/12/2015   LDLCALC 41 01/01/2016   CREATININE 0.86 06/24/2016    Lab on 06/24/2016  Component Date Value Ref Range Status  . Sodium 06/24/2016 138  135 - 145 mEq/L Final  . Potassium 06/24/2016 3.3* 3.5 - 5.1 mEq/L Final  . Chloride 06/24/2016 101  96 - 112 mEq/L Final  . CO2 06/24/2016 27  19 - 32 mEq/L Final  . Glucose, Bld 06/24/2016 224* 70 - 99 mg/dL Final  . BUN 06/24/2016 6  6 - 23 mg/dL Final  . Creatinine, Ser 06/24/2016 0.86  0.40 - 1.20 mg/dL Final    . Calcium 06/24/2016 9.0  8.4 - 10.5 mg/dL Final  . GFR 06/24/2016 73.91  >60.00 mL/min Final  . Fructosamine 06/24/2016 268  0 - 285 umol/L Final   Comment: Published reference interval for apparently healthy subjects between age 56 and 76 is 27 - 285 umol/L and in a poorly controlled diabetic population is 228 - 563 umol/L with a mean of 396 umol/L.           Medication List       This list is accurate as of: 06/29/16  3:20 PM.  Always use your most recent med list.               acetaminophen-codeine 300-30 MG tablet  Commonly known as:  TYLENOL #3  Take 1 tablet by mouth every 6 (six) hours as needed for moderate pain.     amoxicillin-clavulanate 875-125 MG tablet  Commonly known as:  AUGMENTIN  Take 1 tablet by mouth 2 (two) times daily.     amphetamine-dextroamphetamine 30 MG tablet  Commonly known as:  ADDERALL  Take 30 mg by mouth 2 (two) times daily.     atorvastatin 40 MG tablet  Commonly known as:  LIPITOR  Take 1 tablet (40 mg total) by mouth daily at 6 PM.     cephALEXin 500 MG capsule  Commonly known as:  KEFLEX  Take 1 capsule by mouth daily. Reported on 06/29/2016     CHANTIX 0.5 MG tablet  Generic drug:  varenicline  Take 1 mg by mouth daily. Reported on 05/27/2016     chlorproMAZINE 200 MG tablet  Commonly known as:  THORAZINE  Take 200 mg by mouth at bedtime.     colchicine 0.6 MG tablet  Take 0.6 mg by mouth daily.     cyclobenzaprine 10 MG tablet  Commonly known as:  FLEXERIL  Take 10 mg by mouth at bedtime as needed for muscle spasms.     diazepam 10 MG tablet  Commonly known as:  VALIUM  Take 10 mg by mouth at bedtime as needed for anxiety or sleep.     doxycycline 100 MG tablet  Commonly known as:  VIBRA-TABS  Take 1 tablet (100 mg total) by mouth 2 (two) times daily.     esomeprazole 40 MG capsule  Commonly known as:  NEXIUM  Take 40 mg by mouth daily.     fenofibrate 145 MG tablet  Commonly known as:  TRICOR  TAKE 1  TABLET (145 MG TOTAL) BY MOUTH DAILY.     fluconazole 150 MG tablet  Commonly known as:  DIFLUCAN  Take 1 tablet (150 mg total) by mouth 2 (two) times daily.     freestyle lancets  Use as instructed to check blood sugar 3 times per day dx code E11.65  furosemide 20 MG tablet  Commonly known as:  LASIX  TAKE 1 TABLET BY MOUTH AS NEEDED     glucose blood test strip  Commonly known as:  FREESTYLE LITE  Use as instructed to check blood sugar 3 times per day dx code E11.65     insulin aspart 100 UNIT/ML injection  Commonly known as:  NOVOLOG  Use per v-go pump 76 units daily     insulin aspart protamine - aspart (70-30) 100 UNIT/ML FlexPen  Commonly known as:  NOVOLOG MIX 70/30 FLEXPEN  Inject 30 units in the morning and 20 units in the evening.     INVOKANA 300 MG Tabs tablet  Generic drug:  canagliflozin  TAKE 300 MG BY MOUTH DAILY BEFORE BREAKFAST.     ketoconazole 2 % cream  Commonly known as:  NIZORAL  Apply 1 application topically daily as needed for irritation.     oxyCODONE 5 MG immediate release tablet  Commonly known as:  Oxy IR/ROXICODONE  Take 5 mg by mouth 2 (two) times daily as needed for moderate pain or severe pain.     oxyCODONE-acetaminophen 10-325 MG tablet  Commonly known as:  PERCOCET  Take 1 tablet by mouth 3 (three) times daily as needed for pain.     phentermine 37.5 MG tablet  Commonly known as:  ADIPEX-P  Take 37.5 mg by mouth daily as needed (appetite control). Reported on 05/27/2016     potassium chloride SA 20 MEQ tablet  Commonly known as:  K-DUR,KLOR-CON  Take 1 tablet (20 mEq total) by mouth daily.     rizatriptan 10 MG tablet  Commonly known as:  MAXALT  Take 1 tablet (10 mg total) by mouth as needed for migraine. May repeat in 2 hours if needed     spironolactone 100 MG tablet  Commonly known as:  ALDACTONE  Take 1 tablet (100 mg total) by mouth daily.     sulfamethoxazole-trimethoprim 800-160 MG tablet  Commonly known as:   BACTRIM DS,SEPTRA DS  Take 2 tablets by mouth 2 (two) times daily.     tamsulosin 0.4 MG Caps capsule  Commonly known as:  FLOMAX  Take 0.4 mg by mouth daily.     TRULICITY 5.39 JQ/7.3AL Sopn  Generic drug:  Dulaglutide  Inject 5 mLs as directed once a week.     V-GO 40 Kit  Use 1 device per day.     vortioxetine HBr 5 MG Tabs  Commonly known as:  TRINTELLIX  Take 5 mg by mouth daily.     TRINTELLIX 20 MG Tabs  Generic drug:  vortioxetine HBr  Take 20 mg by mouth daily.        Allergies:  Allergies  Allergen Reactions  . Latex Itching, Rash and Other (See Comments)    Pt states she cannot use condoms - cause an infection.  Use of latex on skin is okay.  Tape causes rash  . Ibuprofen Other (See Comments)    headaches  . Sweetening Enhancer [Flavoring Agent] Nausea And Vomiting    And headaches  . Tetracyclines & Related Nausea And Vomiting    Yeast infections  . Trazodone And Nefazodone Other (See Comments)    Hallucinations   . Triazolam Other (See Comments)    hallucinations  . Aspartame And Phenylalanine Nausea And Vomiting    headaches    Past Medical History  Diagnosis Date  . Hypertension   . Hyperlipemia   . GERD (gastroesophageal reflux disease)   .  Insomnia   . SUI (stress urinary incontinence, female) S/P SLING 12-29-2011  . SOB (shortness of breath) on exertion   . Bipolar disorder ([place])   . Neurogenic bladder   . Hypercholesterolemia   . Gastroparesis   . Ulcer   . MRSA (methicillin resistant Staphylococcus aureus)   . Chronic pain   . Edema   . Chest pain     a. 2008 Cath: normal cors;  b. 12/2013 Lexi CL: EF 47%, no ischemia/infarct.  . Anxiety   . Vertigo   . COPD (chronic obstructive pulmonary disease) ([place])   . Cholecystitis   . Hypersomnia   . Tobacco abuse   . Claudication ([place])     a. 12/2013 ABI's: R 0.97, L 0.94.  [name] Palpitations   . Anginal pain ([place])   . Pneumonia     "twice, I think" (02/12/2014)  . Sleep apnea     didn't  tolerate cpap (02/12/2014)  . Type II diabetes mellitus ([place])   . History of blood transfusion 1986    "related to lost a child" (02/12/2014)  . H/O hiatal hernia   . BVQXIHWT(888.2)     "weekly" (02/12/2014)  . Stroke ([place]) 02/12/2014    "eyesight is messed up; not steady on my feet" (02/12/2014)  . Arthritis     "qwhere" (02/12/2014)  . DJD (degenerative joint disease)   . Chronic back pain   . Depression   . Personality disorder   . OSA (obstructive sleep apnea) 07/04/2014    Past Surgical History  Procedure Laterality Date  . Knee arthroscopy w/ allograft impant Left     graft x 2  . Foot surgery Bilateral   . Anterior cervical decomp/discectomy fusion  2000    C6 - 7  . Pubovaginal sling  12/29/2011    Procedure: Gaynelle Arabian;  Surgeon: Reece Packer, MD;  Location[facility];  Service: Urology;  Laterality: N/A;  cysto and sparc sling   . Lumbar fusion      "cage in my spine"  . Total abdominal hysterectomy w/ bilateral salpingoophorectomy  1997  . Multiple laparoscopies for endometriosis    . Cesarean section  1989; 1992  . Repeat reconstruction acl left knee/ screws removed  03-28-2000    CADAVER GRAFT  . 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 Knee surgery      TOTAL 8 SURG'S  . Cystoscopy with injection  05/04/2012    Procedure: CYSTOSCOPY WITH INJECTION;  Surgeon: Reece Packer, MD;  Location: [facility];  Service: Urology;  Laterality: N/A;  MACROPLASTIQUE INJECTION  . Cystoscopy with injection  08/28/2012    Procedure: CYSTOSCOPY WITH INJECTION;  Surgeon: Reece Packer, MD;  Location: [facility];  Service: Urology;  Laterality: N/A;  cysto and macroplastique   . Carpal tunnel release Right 04-25-2013  . Cysto N/A 04/30/2013    Procedure: CYSTOSCOPY;  Surgeon: Reece Packer, MD;  Location: [facility];  Service: Urology;  Laterality: N/A;  . Pubovaginal sling N/A 04/30/2013    Procedure: REMOVAL  OF VAGINAL MESH;  Surgeon: Reece Packer, MD;  Location: [facility];  Service: Urology;  Laterality: N/A;  . Abdominal hysterectomy    . Hernia repair  ?1996    "stomach"  . Cardiac catheterization  05-22-2008   DR SKAINS    NO SIGNIFECANT CAD/ NORMAL LV/  EF 65%/  NO WALL MOTION ABNORMALITIES  . Cardiac catheterization  02/12/2014  . Left  heart catheterization with coronary angiogram N/A 02/12/2014    Procedure: LEFT HEART CATHETERIZATION WITH CORONARY ANGIOGRAM;  Surgeon: Candee Furbish, MD;  Location: [facility] CATH LAB;  Service: Cardiovascular;  Laterality: N/A;  . Laparoscopic sigmoid colectomy  04/22/2015  . Laparoscopic sigmoid colectomy N/A 04/22/2015    Procedure: LAPAROSCOPIC HAND ASSISTED SIGMOID COLECTOMY;  Surgeon: Erroll Luna, MD;  Location: [facility];  Service: General;  Laterality: N/A;    Family History  Problem Relation Age of Onset  . Hypertension Mother   . Diabetes Mother   . Cancer - Other Mother     lymphoma   . Cancer - Other Father     lung, bladder cancer   . Heart attack Father   . Cancer - Other Brother     bladder cancer     Social History:  reports that she has been smoking Cigarettes and E-cigarettes.  She has a 41 pack-year smoking history. She has never used smokeless tobacco. She reports that she drinks alcohol. She reports that she does not use illicit drugs.  Review of Systems -   She is being treated by the podiatrist for persistent foot ulcer  And is still on antibiotics  She does not know if her also is healing and she is still having some redness in her right lower leg. Is also apparently having Charcot foot fracture  She has history of high triglycerides treated with fenofibrate   Currently she is taking Lipitor 40 mg for high LDL  Lab Results  Component Value Date   CHOL 95 05/25/2016   HDL 28.30* 05/25/2016   LDLCALC 41 01/01/2016   LDLDIRECT 40.0 05/25/2016   TRIG 307.0* 05/25/2016   CHOLHDL 3 05/25/2016    Hypertension: This has been  relatively mild and controlled . She is not taking spironolactone  HYPOKALEMIA: She is not taking Lasix  But not clear why her potassium was low  She has not taken her supplement   Lab Results  Component Value Date   CREATININE 0.86 06/24/2016   BUN 6 06/24/2016   NA 138 06/24/2016   K 3.3* 06/24/2016   CL 101 06/24/2016   CO2 27 06/24/2016     Painful neuropathy: She has had persistent symptoms including leg pains and numbness as well as difficulty with balance and has tried various drugs including gabapentin without much relief  Foot exam shows absent pedal pulses and distal sensation   Examination:   BP 118/82 mmHg  Pulse 98  Ht '5\' 5"'  (1.651 m)  Wt 211 lb (95.709 kg)  BMI 35.11 kg/m2  SpO2 95%  Body mass index is 35.11 kg/(m^2).   Mild  facial puffiness present  she has swelling of her right lower leg with mild redness of the skin in the lower part   Right foot not examined because of bandaging and her wearing a boot  Assesment/Plan:   1. Diabetes type 2, with persistently poor control, BMI 35 See history of present illness for detailed discussion of current diabetes management, blood sugar patterns and problems identified She appears to be doing overall better with the V-go pump especially with being able to bolus for her meals. She is having a better fructosamine level in the normal range now Her lab glucose was over 200 and likely she is getting high readings from drinking a lot of juice. Not clear if her postprandial readings are controlled.  She has variable readings, not checking the last 2 weeks  No fasting readings available  Recommendations:  Regular glucose monitoring as discussed in detail  If her readings 2 hours after the meal are not within range she will need to increase her boluses  Cut back on juices  If she does have more than 6-8 ounce of juice she will need to take 2-4 units bolus   She will take correction doses of 2 units for glucose  over 150, 4 units if over 200 and 6 units if over 300  if fasting readings are significantly high will need to add Lantus also  Do not think she needs to take the Trulicity, currently being prescribed by PCP  2. Hypertension: Blood pressure is high normal  However she is getting hypokalemic and will need to restart Aldactone Continue to leave off Lasix  3.  She will call today and try to follow-up with podiatrist for cellulitis with foot ulcer,not clear if she is getting control of cellulitis from antibiotic   There are no Patient Instructions on file for this visit.   Counseling time on subjects discussed above is over 50% of today's 25 minute visit   Tauheedah Bok 06/29/2016, 3:20 PM

## 2016-07-01 ENCOUNTER — Encounter: Payer: Self-pay | Admitting: Podiatry

## 2016-07-01 ENCOUNTER — Ambulatory Visit (INDEPENDENT_AMBULATORY_CARE_PROVIDER_SITE_OTHER): Payer: Medicare Other | Admitting: Podiatry

## 2016-07-01 VITALS — BP 134/88 | HR 69 | Temp 96.6°F | Resp 12

## 2016-07-01 DIAGNOSIS — L97501 Non-pressure chronic ulcer of other part of unspecified foot limited to breakdown of skin: Secondary | ICD-10-CM | POA: Diagnosis not present

## 2016-07-01 DIAGNOSIS — E11621 Type 2 diabetes mellitus with foot ulcer: Secondary | ICD-10-CM | POA: Diagnosis not present

## 2016-07-01 DIAGNOSIS — L97519 Non-pressure chronic ulcer of other part of right foot with unspecified severity: Secondary | ICD-10-CM | POA: Diagnosis not present

## 2016-07-01 DIAGNOSIS — E11610 Type 2 diabetes mellitus with diabetic neuropathic arthropathy: Secondary | ICD-10-CM

## 2016-07-03 NOTE — Progress Notes (Signed)
Subjective:     Patient ID: [name], female   DOB: April 09, 1966, 50 y.o.   MRN: [id number]  HPI patient presents stating she's been doing okay but she's developed a blister in the right arch that her doctor wanted checked and she admits that she has not been doing a good job watching her sugar or taking care of it and she also has had no indications of systemic infection. Temperature taken today was 97.0   Review of Systems     Objective:   Physical Exam Neurological status was found to be significantly diminished bilateral with patient also noted to have vascular issues bilateral with what appears to be diminished DP and PT pulses. Patient has a well-healing plantar lesion right first metatarsal with no current drainage and crusted tissue and in the right arch I noted a blister that has formed measuring approximately 1 cm x 1 cm with mild redness around it but no proximal edema erythema or drainage noted. Patient has negative Homans sign at this time and does not have any streaking occurring from this area. Also noted patient to have collapse medial longitudinal arch right    Assessment:     Poor health obese female who does not take good care of her sugar 20 year history with a healing ulceration of the right plantar first metatarsal and also noted fifth digit and a new blister that's formed in the right arch with no systemic indications of infection    Plan:     H&P and condition reviewed with patient at great length. I did discuss with her the risk factors that she has and the fact that ultimately amputation may be indicated. Using sterile instrumentation I opened the area up and noted to be superficial with sterile fluid and no indication of infective-type process. I advised her to continue taking antibiotics continue soaks and to reduce her activities at this time and she will see Dr. Cannon Kettle on Monday. I also advised that she needs vascular studies and that they will be ordered but first  we want to focus on getting this condition under control. At this point I did advise her that if she should develop any increased erythema edema or any systemic signs of infection she is to go straight to the emergency room

## 2016-07-04 ENCOUNTER — Ambulatory Visit: Payer: Medicare Other | Admitting: Podiatry

## 2016-07-05 ENCOUNTER — Encounter: Payer: Self-pay | Admitting: Sports Medicine

## 2016-07-05 ENCOUNTER — Ambulatory Visit (INDEPENDENT_AMBULATORY_CARE_PROVIDER_SITE_OTHER): Payer: Medicare Other | Admitting: Sports Medicine

## 2016-07-05 ENCOUNTER — Other Ambulatory Visit: Payer: Self-pay

## 2016-07-05 DIAGNOSIS — Z01812 Encounter for preprocedural laboratory examination: Secondary | ICD-10-CM

## 2016-07-05 DIAGNOSIS — L03119 Cellulitis of unspecified part of limb: Secondary | ICD-10-CM

## 2016-07-05 DIAGNOSIS — E11621 Type 2 diabetes mellitus with foot ulcer: Secondary | ICD-10-CM

## 2016-07-05 DIAGNOSIS — Z794 Long term (current) use of insulin: Secondary | ICD-10-CM

## 2016-07-05 DIAGNOSIS — E1165 Type 2 diabetes mellitus with hyperglycemia: Secondary | ICD-10-CM

## 2016-07-05 DIAGNOSIS — L02619 Cutaneous abscess of unspecified foot: Secondary | ICD-10-CM

## 2016-07-05 DIAGNOSIS — C187 Malignant neoplasm of sigmoid colon: Secondary | ICD-10-CM

## 2016-07-05 DIAGNOSIS — E11610 Type 2 diabetes mellitus with diabetic neuropathic arthropathy: Secondary | ICD-10-CM

## 2016-07-05 DIAGNOSIS — L89891 Pressure ulcer of other site, stage 1: Secondary | ICD-10-CM

## 2016-07-05 DIAGNOSIS — L97519 Non-pressure chronic ulcer of other part of right foot with unspecified severity: Principal | ICD-10-CM

## 2016-07-05 DIAGNOSIS — M79671 Pain in right foot: Secondary | ICD-10-CM

## 2016-07-05 MED ORDER — HYDROCODONE-ACETAMINOPHEN 5-325 MG PO TABS: 1.0000 | ORAL_TABLET | Freq: Four times a day (QID) | ORAL | Status: DC | PRN

## 2016-07-05 MED ORDER — INSULIN ASPART PROT & ASPART (70-30 MIX) 100 UNIT/ML ~~LOC~~ SUSP: SUBCUTANEOUS | Status: DC

## 2016-07-05 NOTE — Progress Notes (Signed)
Patient ID: [name], female   DOB: 1966-03-14, 50 y.o.   MRN: [id number]  Subjective: [name] is a 50 y.o. female patient seen in office for evaluation of ulceration of the right foot and new blister of which she saw Dr. Paulla Dolly for last week that she states came from post op shoe rubbing.  Patient is changing the dressing using triple antibiotic cream and reports that she has a hard time doing so. No issues with Augmentin and states that redness to leg is better. Denies nausea/fever/vomiting/night sweats/shortness of breath. Admits leg and foot pain with continued swelling. Patient has no other pedal complaints at this time.  Patient Active Problem List   Diagnosis Date Noted  . Cancer of sigmoid colon ([place]) 03/09/2015  . OSA (obstructive sleep apnea) 07/04/2014  . Migraine without aura, with intractable migraine, so stated, without mention of status migrainosus 03/26/2014  . Peripheral neuropathy ([place]) 03/03/2014  . Abnormal stress test 02/12/2014  . CVA (cerebral infarction) 02/12/2014  . Chest pain   . Abnormal heart rhythm   . Edema   . Hypertension   . Hyperlipemia   . Hypercholesterolemia   . Other and unspecified hyperlipidemia 11/14/2013  . Type II or unspecified type diabetes mellitus without mention of complication, uncontrolled 07/22/2013  . Unspecified essential hypertension 07/22/2013  . 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 Diabetic polyneuropathy ([place]) 07/22/2013   Current Outpatient Prescriptions on File Prior to Visit  Medication Sig Dispense Refill  . acetaminophen-codeine (TYLENOL #3) 300-30 MG tablet Take 1 tablet by mouth every 6 (six) hours as needed for moderate pain. 30 tablet 0  . amoxicillin-clavulanate (AUGMENTIN) 875-125 MG tablet Take 1 tablet by mouth 2 (two) times daily. (Patient not taking: Reported on 06/29/2016) 20 tablet 0  . amphetamine-dextroamphetamine (ADDERALL) 30 MG  tablet Take 30 mg by mouth 2 (two) times daily.   0  . atorvastatin (LIPITOR) 40 MG tablet Take 1 tablet (40 mg total) by mouth daily at 6 PM. 90 tablet 3  . cephALEXin (KEFLEX) 500 MG capsule Take 1 capsule by mouth daily. Reported on 06/29/2016    . CHANTIX 0.5 MG tablet Take 1 mg by mouth daily. Reported on 05/27/2016    . chlorproMAZINE (THORAZINE) 200 MG tablet Take 200 mg by mouth at bedtime.    . colchicine 0.6 MG tablet Take 0.6 mg by mouth daily.    . cyclobenzaprine (FLEXERIL) 10 MG tablet Take 10 mg by mouth at bedtime as needed for muscle spasms.   0  . diazepam (VALIUM) 10 MG tablet Take 10 mg by mouth at bedtime as needed for anxiety or sleep.     [name] doxycycline (VIBRA-TABS) 100 MG tablet Take 1 tablet (100 mg total) by mouth 2 (two) times daily. (Patient not taking: Reported on 06/29/2016) 20 tablet 0  . esomeprazole (NEXIUM) 40 MG capsule Take 40 mg by mouth daily.     . fenofibrate (TRICOR) 145 MG tablet TAKE 1 TABLET (145 MG TOTAL) BY MOUTH DAILY. 30 tablet 5  . fluconazole (DIFLUCAN) 150 MG tablet Take 1 tablet (150 mg total) by mouth 2 (two) times daily. 30 tablet 0  . furosemide (LASIX) 20 MG tablet TAKE 1 TABLET BY MOUTH AS NEEDED 15 tablet 1  . glucose blood (FREESTYLE LITE) test strip Use as instructed to check blood sugar 3 times per day dx code E11.65 100 each 5  . insulin aspart (NOVOLOG) 100 UNIT/ML injection Use  per v-go pump 76 units daily 30 mL 2  . Insulin Disposable Pump (V-GO 40) KIT Use 1 device per day. 1 kit 3  . INVOKANA 300 MG TABS tablet TAKE 300 MG BY MOUTH DAILY BEFORE BREAKFAST. 30 tablet 3  . ketoconazole (NIZORAL) 2 % cream Apply 1 application topically daily as needed for irritation.     . Lancets (FREESTYLE) lancets Use as instructed to check blood sugar 3 times per day dx code E11.65 100 each 5  . oxyCODONE (OXY IR/ROXICODONE) 5 MG immediate release tablet Take 5 mg by mouth 2 (two) times daily as needed for moderate pain or severe pain.     [name]  oxyCODONE-acetaminophen (PERCOCET) 10-325 MG per tablet Take 1 tablet by mouth 3 (three) times daily as needed for pain.    . phentermine (ADIPEX-P) 37.5 MG tablet Take 37.5 mg by mouth daily as needed (appetite control). Reported on 05/27/2016  0  . potassium chloride SA (K-DUR,KLOR-CON) 20 MEQ tablet Take 1 tablet (20 mEq total) by mouth daily. 30 tablet 3  . rizatriptan (MAXALT) 10 MG tablet Take 1 tablet (10 mg total) by mouth as needed for migraine. May repeat in 2 hours if needed 10 tablet 3  . spironolactone (ALDACTONE) 50 MG tablet Take 1 tablet (50 mg total) by mouth daily. 30 tablet 2  . tamsulosin (FLOMAX) 0.4 MG CAPS capsule Take 0.4 mg by mouth daily.    . TRULICITY 4.98 YM/4.1RA SOPN Inject 5 mLs as directed once a week.     . Vortioxetine HBr (TRINTELLIX) 20 MG TABS Take 20 mg by mouth daily.     . Vortioxetine HBr 5 MG TABS Take 5 mg by mouth daily.     No current facility-administered medications on file prior to visit.   Allergies  Allergen Reactions  . Latex Itching, Rash and Other (See Comments)    Pt states she cannot use condoms - cause an infection.  Use of latex on skin is okay.  Tape causes rash  . Ibuprofen Other (See Comments)    headaches  . Sweetening Enhancer [Flavoring Agent] Nausea And Vomiting    And headaches  . Tetracyclines & Related Nausea And Vomiting    Yeast infections  . Trazodone And Nefazodone Other (See Comments)    Hallucinations   . Triazolam Other (See Comments)    hallucinations  . Aspartame And Phenylalanine Nausea And Vomiting    headaches    Recent Results (from the past 2160 hour(s))  Basic metabolic panel     Status: Abnormal   Collection Time: 05/25/16  2:14 PM  Result Value Ref Range   Sodium 132 (L) 135 - 145 mEq/L   Potassium 3.3 (L) 3.5 - 5.1 mEq/L   Chloride 99 96 - 112 mEq/L   CO2 25 19 - 32 mEq/L   Glucose, Bld 363 (H) 70 - 99 mg/dL   BUN 8 6 - 23 mg/dL   Creatinine, Ser 0.85 0.40 - 1.20 mg/dL   Calcium 8.9 8.4 -  10.5 mg/dL   GFR 74.94 >60.00 mL/min  Hemoglobin A1c     Status: Abnormal   Collection Time: 05/25/16  2:14 PM  Result Value Ref Range   Hgb A1c MFr Bld 10.2 (H) 4.6 - 6.5 %    Comment: Glycemic Control Guidelines for People with Diabetes:Non Diabetic:  <6%Goal of Therapy: <7%Additional Action Suggested:  >8%   Lipid panel     Status: Abnormal   Collection Time: 05/25/16  2:14 PM  Result Value Ref Range   Cholesterol 95 0 - 200 mg/dL    Comment: ATP III Classification       Desirable:  < 200 mg/dL               Borderline High:  200 - 239 mg/dL          High:  > = 240 mg/dL   Triglycerides 307.0 (H) 0.0 - 149.0 mg/dL    Comment: Normal:  <150 mg/dLBorderline High:  150 - 199 mg/dL   HDL 28.30 (L) >39.00 mg/dL   VLDL 61.4 (H) 0.0 - 40.0 mg/dL   Total CHOL/HDL Ratio 3     Comment:                Men          Women1/2 Average Risk     3.4          3.3Average Risk          5.0          4.42X Average Risk          9.6          7.13X Average Risk          15.0          11.0                       NonHDL 66.47     Comment: NOTE:  Non-HDL goal should be 30 mg/dL higher than patient's LDL goal (i.e. LDL goal of < 70 mg/dL, would have non-HDL goal of < 100 mg/dL)  LDL cholesterol, direct     Status: None   Collection Time: 05/25/16  2:14 PM  Result Value Ref Range   Direct LDL 40.0 mg/dL    Comment: Optimal:  <100 mg/dLNear or Above Optimal:  100-129 mg/dLBorderline High:  130-159 mg/dLHigh:  160-189 mg/dLVery High:  >190 mg/dL  WOUND CULTURE     Status: None   Collection Time: 06/21/16  5:21 PM  Result Value Ref Range   Gram Stain No WBC Seen    Gram Stain No Squamous Epithelial Cells Seen    Gram Stain No Organisms Seen    Organism ID, Bacteria Multiple Organisms Present,None Predominant     Comment: No Staphylococcus aureus isolated NO GROUP A STREP (S. PYOGENES) ISOLATED   Basic metabolic panel     Status: Abnormal   Collection Time: 06/22/16 12:28 AM  Result Value Ref Range   Sodium  135 135 - 145 mmol/L   Potassium 3.5 3.5 - 5.1 mmol/L   Chloride 102 101 - 111 mmol/L   CO2 26 22 - 32 mmol/L   Glucose, Bld 208 (H) 65 - 99 mg/dL   BUN <5 (L) 6 - 20 mg/dL   Creatinine, Ser 0.66 0.44 - 1.00 mg/dL   Calcium 9.0 8.9 - 10.3 mg/dL   GFR calc non Af Amer >60 >60 mL/min   GFR calc Af Amer >60 >60 mL/min    Comment: (NOTE) The eGFR has been calculated using the CKD EPI equation. This calculation has not been validated in all clinical situations. eGFR's persistently <60 mL/min signify possible Chronic Kidney Disease.    Anion gap 7 5 - 15  CBC with Differential     Status: Abnormal   Collection Time: 06/22/16 12:28 AM  Result Value Ref Range   WBC 11.1 (H) 4.0 - 10.5 K/uL   RBC 4.03 3.87 - 5.11  MIL/uL   Hemoglobin 12.1 12.0 - 15.0 g/dL   HCT 36.5 36.0 - 46.0 %   MCV 90.6 78.0 - 100.0 fL   MCH 30.0 26.0 - 34.0 pg   MCHC 33.2 30.0 - 36.0 g/dL   RDW 12.9 11.5 - 15.5 %   Platelets 299 150 - 400 K/uL   Neutrophils Relative % 64 %   Neutro Abs 7.1 1.7 - 7.7 K/uL   Lymphocytes Relative 29 %   Lymphs Abs 3.3 0.7 - 4.0 K/uL   Monocytes Relative 5 %   Monocytes Absolute 0.5 0.1 - 1.0 K/uL   Eosinophils Relative 1 %   Eosinophils Absolute 0.1 0.0 - 0.7 K/uL   Basophils Relative 1 %   Basophils Absolute 0.1 0.0 - 0.1 K/uL  Basic metabolic panel     Status: Abnormal   Collection Time: 06/24/16  3:17 PM  Result Value Ref Range   Sodium 138 135 - 145 mEq/L   Potassium 3.3 (L) 3.5 - 5.1 mEq/L   Chloride 101 96 - 112 mEq/L   CO2 27 19 - 32 mEq/L   Glucose, Bld 224 (H) 70 - 99 mg/dL   BUN 6 6 - 23 mg/dL   Creatinine, Ser 0.86 0.40 - 1.20 mg/dL   Calcium 9.0 8.4 - 10.5 mg/dL   GFR 73.91 >60.00 mL/min  Fructosamine     Status: None   Collection Time: 06/24/16  3:17 PM  Result Value Ref Range   Fructosamine 268 0 - 285 umol/L    Comment: Published reference interval for apparently healthy subjects between age 48 and 61 is 66 - 285 umol/L and in a poorly controlled  diabetic population is 228 - 563 umol/L with a mean of 396 umol/L.     Objective: There were no vitals filed for this visit.  General: Patient is awake, alert, oriented x 3 and in no acute distress. Flat affect and poor hygiene habits.  Dermatology: Skin is warm and dry bilateral with a superficial blister medial midfoot and a partial thickness ulceration present Sub met 1 and medial aspect of 5th toe on right foot. Sub met 1 Ulceration measures 0.5 cm x 0.5 cm x 0.2cm (last measurement 1cm x 0.5cm x 0.5cm) and ulceration medial right 5 toe measures 0.2x0.2cm (same as prior). There is a keratotic border with a granular base. The ulcerations do not probe to bone. There is no malodor, no active drainage, improved erythema, + edema. No other acute signs of infection.   Vascular: Dorsalis Pedis pulse = 1/4 Bilateral,  Posterior Tibial pulse = 0/4 right due to edema, 1/4 on left,  Capillary Fill Time < 5 seconds  Neurologic: Protective sensation absent bilateral.  Musculosketal: There is decreased ankle joint range of motion Bilateral. There is decreased Subtalar joint range of motion Bilateral. There is a decrease in 1st metatarsophalangeal joint range of motion Bilateral. No Pain with palpation to ulcerated areas. + pain to palpation right>left foot. + Pes planus foot type with concern for charcot on right increased midfoot collapse and widening of right foot.   Assessment and Plan:  Problem List Items Addressed This Visit    None    Visit Diagnoses    Diabetic ulcer of right foot associated with type 2 diabetes mellitus ([place])    -  Primary    Relevant Orders    MR Foot Right W Wo Contrast    Diabetic Charcot's foot ([place])        Relevant Medications  HYDROcodone-acetaminophen (NORCO) 5-325 MG tablet    Other Relevant Orders    MR Foot Right W Wo Contrast    Right foot pain        Relevant Medications    HYDROcodone-acetaminophen (NORCO) 5-325 MG tablet    Other Relevant Orders    MR  Foot Right W Wo Contrast    Blood tests prior to treatment or procedure        Relevant Orders    Creatinine, serum    Cellulitis and abscess of foot        improved      -Examined patient and discussed the progression of the wounds and treatment alternatives. -Rx MRI right foot eval for charcot in the setting of midfoot collapse and ulceration -Excisionally dedbrided ulcerations to healthy bleeding borders using a sterile chisel blade. -Applied antibiotic cream and dry sterile dressing to ulcerations and blister site and instructed patient to continue with daily dressings at home consisting of triple antibiotic and bandaid/dry sterile dressing -Recommend cont with post op shoe for ulcer and possible charcot event. I added padding to post op shoe to prevent rubbing -Continue Augmentin until completed -Rx Norco for pain  -Patient to return to office after MRI for follow up care and evaluation or sooner if problems arise.   Landis Martins, DPM

## 2016-07-15 ENCOUNTER — Ambulatory Visit
Admission: RE | Admit: 2016-07-15 | Discharge: 2016-07-15 | Disposition: A | Discharge status: Home / Self Care | Payer: Medicare Other | Source: Ambulatory Visit | Attending: Sports Medicine | Admitting: Sports Medicine

## 2016-07-15 DIAGNOSIS — E11610 Type 2 diabetes mellitus with diabetic neuropathic arthropathy: Secondary | ICD-10-CM

## 2016-07-15 DIAGNOSIS — L97519 Non-pressure chronic ulcer of other part of right foot with unspecified severity: Principal | ICD-10-CM

## 2016-07-15 DIAGNOSIS — M79671 Pain in right foot: Secondary | ICD-10-CM

## 2016-07-15 DIAGNOSIS — E11621 Type 2 diabetes mellitus with foot ulcer: Secondary | ICD-10-CM

## 2016-07-15 MED ORDER — GADOBENATE DIMEGLUMINE 529 MG/ML IV SOLN
20.0000 mL | Freq: Once | INTRAVENOUS | Status: AC | PRN
  Administered 2016-07-15: 20 mL via INTRAVENOUS

## 2016-07-27 ENCOUNTER — Telehealth: Payer: Self-pay | Admitting: *Deleted

## 2016-07-27 NOTE — Telephone Encounter (Signed)
Pt request MRI results. Informed pt the MRI had been finalized and I saw she had an appt, and if she would like she could see if there was an earlier appt.  Pt states she had spoken with the schedulers and they didn't have anything sooner.  I told pt to call later to see if the situation had changed.

## 2016-07-29 ENCOUNTER — Other Ambulatory Visit: Payer: Self-pay | Admitting: *Deleted

## 2016-07-29 ENCOUNTER — Telehealth: Payer: Self-pay | Admitting: *Deleted

## 2016-07-29 ENCOUNTER — Other Ambulatory Visit (HOSPITAL_BASED_OUTPATIENT_CLINIC_OR_DEPARTMENT_OTHER): Payer: Self-pay

## 2016-07-29 DIAGNOSIS — C187 Malignant neoplasm of sigmoid colon: Secondary | ICD-10-CM

## 2016-07-29 LAB — COMPREHENSIVE METABOLIC PANEL
ALT: 13 U/L (ref 0–55)
ANION GAP: 13 meq/L — AB (ref 3–11)
AST: 11 U/L (ref 5–34)
Albumin: 3.5 g/dL (ref 3.5–5.0)
Alkaline Phosphatase: 107 U/L (ref 40–150)
BUN: 5.4 mg/dL — ABNORMAL LOW (ref 7.0–26.0)
CHLORIDE: 98 meq/L (ref 98–109)
CO2: 21 meq/L — AB (ref 22–29)
CREATININE: 1 mg/dL (ref 0.6–1.1)
Calcium: 9.2 mg/dL (ref 8.4–10.4)
EGFR: 64 mL/min/{1.73_m2} — AB (ref >90)
Glucose: 499 mg/dl — ABNORMAL HIGH (ref 70–140)
POTASSIUM: 3.6 meq/L (ref 3.5–5.1)
Sodium: 132 mEq/L — ABNORMAL LOW (ref 136–145)
Total Bilirubin: <0.3 mg/dL (ref 0.20–1.20)
Total Protein: 6.8 g/dL (ref 6.4–8.3)

## 2016-07-29 LAB — CBC WITH DIFFERENTIAL/PLATELET
BASO%: 0.6 % (ref 0.0–2.0)
Basophils Absolute: 0.1 10*3/uL (ref 0.0–0.1)
EOS%: 0.5 % (ref 0.0–7.0)
Eosinophils Absolute: 0.1 10*3/uL (ref 0.0–0.5)
HCT: 36.8 % (ref 34.8–46.6)
HGB: 12.2 g/dL (ref 11.6–15.9)
LYMPH%: 20.5 % (ref 14.0–49.7)
MCH: 30 pg (ref 25.1–34.0)
MCHC: 33.2 g/dL (ref 31.5–36.0)
MCV: 90.6 fL (ref 79.5–101.0)
MONO#: 0.6 10*3/uL (ref 0.1–0.9)
MONO%: 4.7 % (ref 0.0–14.0)
NEUT#: 9.3 10*3/uL — ABNORMAL HIGH (ref 1.5–6.5)
NEUT%: 73.7 % (ref 38.4–76.8)
PLATELETS: 239 10*3/uL (ref 145–400)
RBC: 4.06 10*6/uL (ref 3.70–5.45)
RDW: 13.9 % (ref 11.2–14.5)
WBC: 12.7 10*3/uL — ABNORMAL HIGH (ref 3.9–10.3)
lymph#: 2.6 10*3/uL (ref 0.9–3.3)

## 2016-07-29 NOTE — Telephone Encounter (Signed)
Called & left message for pt to watch her diet & take diabetes meds as prescribed, drink plenty of fluids & monitor blood glucose since glucose 499 at this office.  Lab routed to Dr Dwyane Dee.

## 2016-07-30 LAB — CEA: CEA1: 2.8 ng/mL (ref 0.0–4.7)

## 2016-08-01 ENCOUNTER — Other Ambulatory Visit: Payer: Self-pay | Admitting: Hematology

## 2016-08-01 DIAGNOSIS — C187 Malignant neoplasm of sigmoid colon: Secondary | ICD-10-CM

## 2016-08-01 LAB — CEA (IN HOUSE-CHCC): CEA (CHCC-IN HOUSE): 2.87 ng/mL (ref 0.00–5.00)

## 2016-08-04 ENCOUNTER — Ambulatory Visit (INDEPENDENT_AMBULATORY_CARE_PROVIDER_SITE_OTHER): Payer: Medicare Other | Admitting: Podiatry

## 2016-08-04 ENCOUNTER — Encounter: Payer: Self-pay | Admitting: Podiatry

## 2016-08-04 VITALS — BP 140/86 | HR 93 | Resp 16

## 2016-08-04 DIAGNOSIS — L97519 Non-pressure chronic ulcer of other part of right foot with unspecified severity: Secondary | ICD-10-CM

## 2016-08-04 DIAGNOSIS — E11621 Type 2 diabetes mellitus with foot ulcer: Secondary | ICD-10-CM

## 2016-08-04 DIAGNOSIS — E11610 Type 2 diabetes mellitus with diabetic neuropathic arthropathy: Secondary | ICD-10-CM | POA: Diagnosis not present

## 2016-08-05 ENCOUNTER — Encounter: Payer: Self-pay | Admitting: Hematology

## 2016-08-05 ENCOUNTER — Telehealth: Payer: Self-pay | Admitting: Hematology

## 2016-08-05 ENCOUNTER — Ambulatory Visit (HOSPITAL_BASED_OUTPATIENT_CLINIC_OR_DEPARTMENT_OTHER): Payer: Medicare Other | Admitting: Hematology

## 2016-08-05 VITALS — BP 152/86 | HR 97 | Temp 98.6°F | Resp 18 | Ht 65.0 in | Wt 204.3 lb

## 2016-08-05 DIAGNOSIS — E1165 Type 2 diabetes mellitus with hyperglycemia: Secondary | ICD-10-CM

## 2016-08-05 DIAGNOSIS — I10 Essential (primary) hypertension: Secondary | ICD-10-CM

## 2016-08-05 DIAGNOSIS — C187 Malignant neoplasm of sigmoid colon: Secondary | ICD-10-CM | POA: Diagnosis present

## 2016-08-05 DIAGNOSIS — J449 Chronic obstructive pulmonary disease, unspecified: Secondary | ICD-10-CM

## 2016-08-05 DIAGNOSIS — Z8673 Personal history of transient ischemic attack (TIA), and cerebral infarction without residual deficits: Secondary | ICD-10-CM | POA: Diagnosis not present

## 2016-08-05 DIAGNOSIS — F329 Major depressive disorder, single episode, unspecified: Secondary | ICD-10-CM

## 2016-08-05 NOTE — Progress Notes (Signed)
Subjective:     Patient ID: [name], female   DOB: 19-Apr-1966, 50 y.o.   MRN: [id number]  HPI patient presents stating I am still getting some drainage on this right arch and I wanted to get it checked   Review of Systems     Objective:   Physical Exam Neurovascular status unchanged from previous visit with patient found to have a crusted area on the right arch measuring approximately 2.5 x 2 cm with mild redness surrounding it and no proximal edema erythema drainage noted. Patient does not have a temperature and is had no systemic signs of infection and she states it's been like this and just wanted to have it checked    Assessment:     Long-term at risk diabetic with moderate Charcot foot and breakdown of the mid arch area secondary to pressure with obesity is complicating factor    Plan:     Reviewed condition and at this time flushed and cleaned it and applied sterile dressing and discussed possibility for graft in the future and I'm due to have her see Dr. Cannon Kettle next week for consult. Patient will be seen back for [place] to recheck earlier if any issues should occur and I told her if any proximal redness swelling drainage or systemic fever or chills should occur she is to go straight to the emergency room

## 2016-08-05 NOTE — Progress Notes (Signed)
[place]  [phone] [phone]) [phone]  Clinic follow up Note   Patient Care Team: Tamsen Roers, MD as PCP - General (Family Medicine) Elayne Snare, MD as Consulting Physician (Endocrinology) Erroll Luna, MD as Consulting Physician (General Surgery) Truitt Merle, MD as Consulting Physician (Hematology) Arta Silence, MD as Consulting Physician (Gastroenterology) 08/05/2016  CHIEF COMPLAINTS:  Follow up stage I colon cancer   Oncology History   Colon cancer   Staging form: Colon and Rectum, AJCC 7th Edition     Pathologic stage from 05/16/2015: Stage I (T1, N0, cM0) - Signed by Truitt Merle, MD on 06/08/2015       Cancer of sigmoid colon ([place])   02/26/2015 Initial Diagnosis    Sigmoid Colon cancer     02/26/2015 Procedure    Colonoscopy showed internal hemorrhoids, 2 polyps in the transverse and ileocecal valve. There is a ulcerated polypoid lesion in the sigmoid colon which was biopsied.     03/02/2015 Tumor Marker    CEA= 2.5     03/06/2015 Imaging    CT chest, abdomen and pelvis with contrast showed no evidence of metastatic disease.     04/22/2015 Surgery    Sigmoid colon segmental resection, showed grade 2 adenocarcinoma, T1, 1.3 cm, 10 lymph nodes were negative, no lymphovascular invasion, no perineural invasion.       HISTORY OF PRESENTING ILLNESS:  [name] 50 y.o. female with the multiple medical comorbidities, including diabetes, hypertension, depression and stroke in 2015, is here because of recently diagnosed colon cancer.  She has chronic constipation, no nausea, abdominal pain or diarrhea. She denies melena or hematochezia. She was referred to see GI Dr. Paulita Fujita, and underwent a colonoscopy on 02/26/2015. The colonoscopy showed internal hemorrhoids, 2 polyps in the transverse and ileocecal valve. A ulcerated polypoid lesion in the sigmoid colon was seen and biopsied, which showed adenocarcinoma. She was referred to  surgeon Dr.  Brantley Stage, and underwent laparoscopic sigmoid colon segmental resection on 04/22/2015.  She has recovered well from surgery. She was discharged home after surgery. She had ED visit on 05/04/2015 for wound dehiscence after staple removal. She has been changing the dressing every other day at home. She has appointment to see Dr. Brantley Stage this abdomen.  She denies any significant pain, BM is normal. No other new complains. She has diabetic neuropathy in hands and feet, which is stable. She lives alone, does not have much social support. She appears to be depressed with a very flat facial expression, but she denies suicidal ideas.  CURRENT THERAPY: Observation  INTERIM HISTORY  [name] returns for follow-up.   Her right foot wound has not healed, she has been seeing Dr. Cannon Kettle for that.  Her blood glucose remained to be high, was 499, blood test last week. She is using insulin pump now. She appears to be little drowsy during her office visit, but denies any abdominal discomfort, nausea, change of her bowel habit, hematochezia, or other new symptoms.  MEDICAL HISTORY:  Past Medical History:  Diagnosis Date  . Anginal pain ([place])   . Anxiety   . Arthritis    "qwhere" (02/12/2014)  . Bipolar disorder ([place])   . Chest pain    a. 2008 Cath: normal cors;  b. 12/2013 [facility] CL: EF 47%, no ischemia/infarct.  . Cholecystitis   . Chronic back pain   . Chronic pain   . Claudication ([place])    a. 12/2013 ABI's: R 0.97, L 0.94.  [name] COPD (chronic obstructive  pulmonary disease) ([place])   . Depression   . DJD (degenerative joint disease)   . Edema   . Gastroparesis   . GERD (gastroesophageal reflux disease)   . H/O hiatal hernia   . ZMOQHUTM(546.5)    "weekly" (02/12/2014)  . History of blood transfusion 1986   "related to lost a child" (02/12/2014)  . Hypercholesterolemia   . Hyperlipemia   . Hypersomnia   . Hypertension   . Insomnia   . MRSA (methicillin resistant Staphylococcus aureus)   . Neurogenic bladder    . OSA (obstructive sleep apnea) 07/04/2014  . Palpitations   . Personality disorder   . Pneumonia    "twice, I think" (02/12/2014)  . Sleep apnea    didn't tolerate cpap (02/12/2014)  . SOB (shortness of breath) on exertion   . Stroke ([place]) 02/12/2014   "eyesight is messed up; not steady on my feet" (02/12/2014)  . SUI (stress urinary incontinence, female) S/P SLING 12-29-2011  . Tobacco abuse   . Type II diabetes mellitus ([place])   . Ulcer   . Vertigo     SURGICAL HISTORY: Past Surgical History:  Procedure Laterality Date  . ABDOMINAL HYSTERECTOMY    . ANTERIOR CERVICAL DECOMP/DISCECTOMY FUSION  2000   C6 - 7  . CARDIAC CATHETERIZATION  05-22-2008   DR SKAINS   NO SIGNIFECANT CAD/ NORMAL LV/  EF 65%/  NO WALL MOTION ABNORMALITIES  . CARDIAC CATHETERIZATION  02/12/2014  . CARPAL TUNNEL RELEASE Right 04-25-2013  . [place]; 1992  . CYSTO N/A 04/30/2013   Procedure: CYSTOSCOPY;  Surgeon: Reece Packer, MD;  Location: [facility];  Service: Urology;  Laterality: N/A;  . CYSTOSCOPY WITH INJECTION  05/04/2012   Procedure: CYSTOSCOPY WITH INJECTION;  Surgeon: Reece Packer, MD;  Location: [facility];  Service: Urology;  Laterality: N/A;  MACROPLASTIQUE INJECTION  . CYSTOSCOPY WITH INJECTION  08/28/2012   Procedure: CYSTOSCOPY WITH INJECTION;  Surgeon: Reece Packer, MD;  Location: [facility];  Service: Urology;  Laterality: N/A;  cysto and macroplastique   . FOOT SURGERY Bilateral   . HERNIA REPAIR  ?1996   "stomach"  . KNEE ARTHROSCOPY W/ ALLOGRAFT IMPANT Left    graft x 2  . KNEE SURGERY     TOTAL 8 SURG'S  . LAPAROSCOPIC SIGMOID COLECTOMY  04/22/2015  . LAPAROSCOPIC SIGMOID COLECTOMY N/A 04/22/2015   Procedure: LAPAROSCOPIC HAND ASSISTED SIGMOID COLECTOMY;  Surgeon: Erroll Luna, MD;  Location: [place] [facility];  Service: General;  Laterality: N/A;  . LEFT HEART CATHETERIZATION WITH CORONARY ANGIOGRAM N/A 02/12/2014   Procedure: LEFT HEART  CATHETERIZATION WITH CORONARY ANGIOGRAM;  Surgeon: Candee Furbish, MD;  Location: [facility] C[facility];  Service: Cardiovascular;  Laterality: N/A;  . LUMBAR FUSION     "cage in my spine"  . MULTIPLE LAPAROSCOPIES FOR ENDOMETRIOSIS    . PUBOVAGINAL SLING  12/29/2011   Procedure: Gaynelle Arabian;  Surgeon: Reece Packer, MD;  Location[facility];  Service: Urology;  Laterality: N/A;  cysto and sparc sling   . PUBOVAGINAL SLING N/A 04/30/2013   Procedure: REMOVAL OF VAGINAL MESH;  Surgeon: Reece Packer, MD;  Location: [facility];  Service: Urology;  Laterality: N/A;  . RECONSTURCTION OF CONGENITAL UTERUS ANOMALY  1983  . REPEAT RECONSTRUCTION ACL LEFT KNEE/ SCREWS REMOVED  03-28-2000   CADAVER GRAFT  . TOTAL ABDOMINAL HYSTERECTOMY W/ BILATERAL SALPINGOOPHORECTOMY  1997    SOCIAL HISTORY: Social History   Social History  .  Marital status: Divorced    Spouse name: N/A  . Number of children: 2  . Years of education: 6th   Occupational History  . Not on file.   Social History Main Topics  . Smoking status: Current Every Day Smoker    Packs/day: 1.00    Years: 41.00    Types: Cigarettes, E-cigarettes  . Smokeless tobacco: Never Used  . Alcohol use 0.0 oz/week     Comment: 02/12/2014 "might have a mixed drink on special occasions"  . Drug use: No  . Sexual activity: Yes   Other Topics Concern  . Not on file   Social History Narrative  Patient is divorced and lives alone- independent in ADLs, Drives   Patient has two adult children- grown son and daughter who is special needs in a home   Patient is disabled since 47   Patient has a 6th grade education.   Patient is right-handed.   Depression-medication and therapist   Patient drinks 2- 3 liters of soda and drinks tea but not everyday.    Does not routinely exercise.  [employer]  FAMILY HISTORY: Family History  Problem Relation Age of Onset  . Hypertension Mother   . Diabetes Mother   . Cancer - Other Mother     lymphoma   . Cancer - Other Father     lung, bladder cancer   . Heart attack Father   . Cancer - Other Brother     bladder cancer     ALLERGIES:  is allergic to latex; ibuprofen; sweetening enhancer [flavoring agent]; tetracyclines & related; trazodone and nefazodone; triazolam; and aspartame and phenylalanine.  MEDICATIONS:  Current Outpatient Prescriptions  Medication Sig Dispense Refill  . CHANTIX 0.5 MG tablet Take 1 mg by mouth daily. Reported on 05/27/2016    . chlorproMAZINE (THORAZINE) 200 MG tablet Take 200 mg by mouth at bedtime.    [name] esomeprazole (NEXIUM) 40 MG capsule Take 40 mg by mouth daily.     [name] glucose blood (FREESTYLE LITE) test strip Use as instructed to check blood sugar 3 times per day dx code E11.65 100 each 5  . insulin aspart (NOVOLOG) 100 UNIT/ML injection Use per v-go pump 76 units daily 30 mL 2  . Insulin Disposable Pump (V-GO 40) KIT Use 1 device per day. 1 kit 3  . INVOKANA 300 MG TABS tablet TAKE 300 MG BY MOUTH DAILY BEFORE BREAKFAST. 30 tablet 3  . Lancets (FREESTYLE) lancets Use as instructed to check blood sugar 3 times per day dx code E11.65 100 each 5  . oxyCODONE (OXY IR/ROXICODONE) 5 MG immediate release tablet Take 5 mg by mouth 2 (two) times daily as needed for moderate pain or  severe pain.     [name] oxyCODONE-acetaminophen (PERCOCET) 10-325 MG per tablet Take 1 tablet by mouth 3 (three) times daily as needed for pain.    [name] spironolactone (ALDACTONE) 50 MG tablet Take 1 tablet (50 mg total) by mouth daily. 30 tablet 2  . tamsulosin (FLOMAX) 0.4 MG CAPS capsule Take 0.4 mg by mouth daily.    . TRULICITY 9.44 HQ/7.5FF SOPN Inject 5 mLs as directed once a week.     . Vortioxetine HBr (TRINTELLIX) 20 MG TABS Take 20 mg by mouth daily.     . Vortioxetine HBr 5 MG TABS Take 5 mg by mouth daily.    [name] amphetamine-dextroamphetamine (ADDERALL) 30 MG tablet Take 30 mg by mouth 2 (two) times daily.   0  . atorvastatin (LIPITOR) 40 MG tablet Take 1 tablet (40 mg total) by mouth daily at 6 PM. (Patient not taking: Reported on 08/05/2016) 90 tablet 3  . cyclobenzaprine (FLEXERIL) 10 MG tablet Take 10 mg by mouth at bedtime as needed for muscle spasms.   0  . diazepam (VALIUM) 10 MG tablet Take 10 mg by mouth at bedtime as needed for anxiety or sleep.     . fenofibrate (TRICOR) 145 MG tablet TAKE 1 TABLET (145 MG TOTAL) BY MOUTH DAILY. (Patient not taking: Reported on 08/05/2016) 30 tablet 5  . fluconazole (DIFLUCAN) 150 MG tablet Take 1 tablet (150 mg total) by mouth 2 (two) times  daily. (Patient not taking: Reported on 08/05/2016) 30 tablet 0  . furosemide (LASIX) 20 MG tablet TAKE 1 TABLET BY MOUTH AS NEEDED (Patient not taking: Reported on 08/05/2016) 15 tablet 1  . rizatriptan (MAXALT) 10 MG tablet Take 1 tablet (10 mg total) by mouth as needed for migraine. May repeat in 2 hours if needed (Patient not taking: Reported on 08/05/2016) 10 tablet 3   No current facility-administered medications for this visit.     REVIEW OF SYSTEMS:   Constitutional: Denies fevers, chills or abnormal night sweats Eyes: Denies blurriness of vision, double vision or watery eyes Ears, nose, mouth, throat, and face: Denies mucositis or sore throat Respiratory: Denies cough, dyspnea or wheezes Cardiovascular:  Denies palpitation, chest discomfort or lower extremity swelling Gastrointestinal:  Denies nausea, heartburn or change in bowel habits Skin: Denies abnormal skin rashes Lymphatics: Denies new lymphadenopathy or easy bruising Neurological:Denies numbness, tingling or new weaknesses Behavioral/Psych: Mood is stable, no new changes  All other systems were reviewed with the patient and are negative.  PHYSICAL EXAMINATION: ECOG PERFORMANCE STATUS: 1 - Symptomatic but completely ambulatory  Vitals:   08/05/16 1401 08/05/16 1433  BP: (!) 143/82 (!) 152/86  Pulse: (!) 106 97  Resp: 18   Temp: 98.6 F (37 C)    Filed Weights   08/05/16 1401  Weight: 204 lb 4.8 oz (92.7 kg)    GENERAL:alert, no distress and comfortable SKIN: skin color, texture, turgor are normal, no rashes or significant lesions except her wound at  Her in the site of the right foot. It is wrapped.  EYES: normal, conjunctiva are pink and non-injected, sclera clear OROPHARYNX:no exudate, no erythema and lips, buccal mucosa, and tongue normal  NECK: supple, thyroid normal size, non-tender, without nodularity LYMPH:  no palpable lymphadenopathy in the cervical, axillary or inguinal LUNGS: clear to auscultation and percussion with normal breathing effort HEART: regular rate & rhythm and no murmurs and no lower extremity edema ABDOMEN:abdomen soft, no abdomen midline surgical wound is covered by gauze. Non-tender and normal bowel sounds Musculoskeletal:no cyanosis of digits and no clubbing  PSYCH: alert & oriented x 3 with fluent speech NEURO: no focal motor/sensory deficits  LABORATORY DATA:  I have reviewed the data as listed CBC Latest Ref Rng & Units 07/29/2016 06/22/2016 03/31/2016  WBC 3.9 - 10.3 10e3/uL 12.7(H) 11.1(H) 12.0(H)  Hemoglobin 11.6 - 15.9 g/dL 12.2 12.1 12.9  Hematocrit 34.8 - 46.6 % 36.8 36.5 38.3  Platelets 145 - 400 10e3/uL 239 299 217    CMP Latest Ref Rng & Units 07/29/2016 06/24/2016 06/22/2016    Glucose 70 - 140 mg/dl 499(H) 224(H) 208(H)  BUN 7.0 - 26.0 mg/dL 5.4(L) 6 <5(L)  Creatinine 0.6 - 1.1 mg/dL 1.0 0.86 0.66  Sodium 136 - 145 mEq/L 132(L) 138 135  Potassium 3.5 - 5.1 mEq/L 3.6 3.3(L) 3.5  Chloride 96 - 112 mEq/L - 101 102  CO2 22 - 29 mEq/L 21(L) 27 26  Calcium 8.4 - 10.4 mg/dL 9.2 9.0 9.0  Total Protein 6.4 - 8.3 g/dL 6.8 - -  Total Bilirubin 0.20 - 1.20 mg/dL <0.30 - -  Alkaline Phos 40 - 150 U/L 107 - -  AST 5 - 34 U/L 11 - -  ALT 0 - 55 U/L 13 - -   CEA  Order: [id number]  Status:  Final result Visible to patient:  No (Not Released) Next appt:  08/08/2016 at 02:45 PM in Podiatry (Landis Martins, DPM)    Ref Range &  Units 7d ago 1moago   CEA 0.0 - 4.7 ng/mL 2.8 3.4CM        PATHOLOGY REPORT Diagnosis 04/22/2015 1. Colon, segmental resection for tumor, Sigmoid - COLONIC ADENOCARCINOMA EXTENDING INTO SUBMUCOSA, 1.3 CM - MARGINS NOT INVOLVED. - NINE BENIGN LYMPH NODES (0/9). 2. Colon, segmental resection, Sigmoid - FOCAL MESENTERIC FAT NECROSIS WITH HEMORRHAGE AND FIBROSIS. - NO EVIDENCE OF MALIGNANCY. - ONE BENIGN LYMPH NODE (0/1). 3. Colon, resection margin (donut), Proximal donut - BENIGN COLON TISSUE. - NO EVIDENCE OF MALIGNANCY. 4. Colon, resection margin (donut), Distal donut - BENIGN COLON TISSUE. - NO EVIDENCE OF MALIGNANCY.  Microscopic Comment 1. COLON AND RECTUM (INCLUDING TRANS-ANAL RESECTION): Specimen: Sigmoid colon with proximal and distal donut margins. Procedure: Segmental resection with proximal and distal donut margins. Tumor site: Sigmoid Specimen integrity: Previously opened Macroscopic intactness of mesorectum: Incomplete Macroscopic tumor perforation: No Invasive tumor: Maximum size: 1.3 cm Histologic type(s): Colonic adenocarcinoma Histologic grade and differentiation: G2: moderately differentiated/low grade Type of polyp in which invasive carcinoma arose: No residual polyp Microscopic extension of invasive tumor: Into  submucosa Lymph-Vascular invasion: N Peri-neural invasion: No Tumor deposit(s) (discontinuous extramural extension): No Resection margins: Proximal margin: Free of tumor Distal margin: Free of tumor Circumferential (radial) (posterior ascending, posterior descending; lateral and posterior mid-rectum; and entire lower 1/3 rectum): N/A Mesenteric margin (sigmoid and transverse): Free of tumor Distance closest margin (if all above margins negative): N/A Treatment effect (neo-adjuvant therapy): No Additional polyp(s): No Non-neoplastic findings: Focal mesenteric adipose tissue fat necrosis with hemorrhage and fibrosis. Lymph nodes: number examined 10; number positive: 0 Pathologic Staging: pT1, pN0, pMX Ancillary studies: Pending. (JDP:kh 4 -28-16)  RADIOGRAPHIC STUDIES: I have personally reviewed the radiological images as listed and agreed with the findings in the report.  CT chest, abdomen and pelvis w contrast on 05/06/2016 IMPRESSION: 1. Stable exam. No evidence for metastatic disease in the chest, abdomen, or pelvis. 2. Suture line in the mid sigmoid colon, compatible with partial colectomy. 3. Centrilobular emphysema. 4. Steatosis.  ASSESSMENT & PLAN: 50year old Caucasian female, with multiple medical comorbidities, who was found to have a stage I sigmoid colon cancer.  1. Stage I sigmoid colon adenocarcinoma, pT1N0M0, moderately differentiated, MMR normall  -I previously reviewed her surgical pathology findings with patient in great details. She appears to have very early stage colon cancer, no lymphovascular invasion, no perineural invasion or other high risk features. Her surgical margins were negative.  -We discussed that her colon cancer is likely cured by complete surgical resection, however she does carry a small risk of cancer recurrence after surgery. -There is no role of adjuvant chemotherapy for stage I colon cancer -Her tumor is MMR normal, she does not have  significant family history of colon or endometrial cancer, unlikely Lynch syndrome. -We discussed the surveillance plan. I recommend lab tests including CEA and physical exam every 3-4 months for the first two year, then every 6-12 month for additional 3 years, colonoscopy and CT scan at one year.  - her surveillance scan from May 2017 showed no evidence of recurrence. Per patient, she has had colonoscopy this year by Dr. OPaulita Fujita which was negative. -Lab reviewed, CBC and CMP normal, except hyperglycemia.  -Her CEA from last week was normal. - no evidence of recurrence so far. - We'll continue surveillance.  2. Depression and coping -She appears to be depressed, has very flat facial expression, she states this is her normal status and she denies suicidal ideas.  3. Poorly controlled diabetes  and hypertension, history of stroke, COPD - She is now on insulin pump. She will follow-up with her endocrinologist --I encourage her to follow-up with her primary care physician closely   4. Smoking cessation -We previously had discussion about smoking cessation. She is still smoking 1.5 pack a day. -She is willing to cut back and quit, I strongly encouraged her to do so. She is not interested in smoking cessation program.  5. Right foot wound -She will continue follow-up with Dr. Cannon Kettle  Plan -I'll see her back in 4 months with lab on same day   All questions were answered. The patient knows to call the clinic with any problems, questions or concerns.  I spent 20 minutes counseling the patient face to face. The total time spent in the appointment was 25 minutes and more than 50% was on counseling.     Truitt Merle, MD 08/05/2016 2:53 PM

## 2016-08-05 NOTE — Telephone Encounter (Signed)
Gave patient avs report and appointments for December  °

## 2016-08-08 ENCOUNTER — Encounter: Payer: Self-pay | Admitting: Sports Medicine

## 2016-08-08 ENCOUNTER — Ambulatory Visit (INDEPENDENT_AMBULATORY_CARE_PROVIDER_SITE_OTHER): Payer: Medicare Other | Admitting: Sports Medicine

## 2016-08-08 ENCOUNTER — Telehealth: Payer: Self-pay

## 2016-08-08 DIAGNOSIS — L97519 Non-pressure chronic ulcer of other part of right foot with unspecified severity: Secondary | ICD-10-CM

## 2016-08-08 DIAGNOSIS — L03119 Cellulitis of unspecified part of limb: Secondary | ICD-10-CM

## 2016-08-08 DIAGNOSIS — M79671 Pain in right foot: Secondary | ICD-10-CM

## 2016-08-08 DIAGNOSIS — L89891 Pressure ulcer of other site, stage 1: Secondary | ICD-10-CM | POA: Diagnosis not present

## 2016-08-08 DIAGNOSIS — E11610 Type 2 diabetes mellitus with diabetic neuropathic arthropathy: Secondary | ICD-10-CM

## 2016-08-08 DIAGNOSIS — E11621 Type 2 diabetes mellitus with foot ulcer: Secondary | ICD-10-CM

## 2016-08-08 DIAGNOSIS — L02619 Cutaneous abscess of unspecified foot: Secondary | ICD-10-CM

## 2016-08-08 MED ORDER — LEVOFLOXACIN 500 MG PO TABS: 500.0000 mg | ORAL_TABLET | Freq: Every day | ORAL | 0 refills | Status: DC

## 2016-08-08 NOTE — Progress Notes (Signed)
Patient ID: [name], female   DOB: [birth year], 50 y.o.   MRN: [id number]  Subjective: [name] is a 50 y.o. female patient seen in office for evaluation of ulceration of the right foot. Patient saw Dr. Paulla Dolly and states that silver antibiotic cream was put on area and left in place until today's visit. Denies nausea/fever/vomiting/night sweats/shortness of breath. Admits leg and foot pain with continued swelling and drainage which brought her in for appt last week. Patient has no other pedal complaints at this time.  Patient Active Problem List   Diagnosis Date Noted  . Cancer of sigmoid colon ([place]) 03/09/2015  . OSA (obstructive sleep apnea) 07/04/2014  . Migraine without aura, with intractable migraine, so stated, without mention of status migrainosus 03/26/2014  . Peripheral neuropathy ([place]) 03/03/2014  . Abnormal stress test 02/12/2014  . CVA (cerebral infarction) 02/12/2014  . Chest pain   . Abnormal heart rhythm   . Edema   . Hypertension   . Hyperlipemia   . Hypercholesterolemia   . Other and unspecified hyperlipidemia 11/14/2013  . Type II or unspecified type diabetes mellitus without mention of complication, uncontrolled 07/22/2013  . Unspecified essential hypertension 07/22/2013  . 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 Diabetic polyneuropathy ([place]) 07/22/2013   Current Outpatient Prescriptions on File Prior to Visit  Medication Sig Dispense Refill  . amphetamine-dextroamphetamine (ADDERALL) 30 MG tablet Take 30 mg by mouth 2 (two) times daily.   0  . atorvastatin (LIPITOR) 40 MG tablet Take 1 tablet (40 mg total) by mouth daily at 6 PM. (Patient not taking: Reported on 08/05/2016) 90 tablet 3  . CHANTIX 0.5 MG tablet Take 1 mg by mouth daily. Reported on 05/27/2016    . chlorproMAZINE (THORAZINE) 200 MG tablet Take 200 mg by mouth at bedtime.    . cyclobenzaprine (FLEXERIL) 10 MG tablet Take 10 mg by  mouth at bedtime as needed for muscle spasms.   0  . diazepam (VALIUM) 10 MG tablet Take 10 mg by mouth at bedtime as needed for anxiety or sleep.     [name] esomeprazole (NEXIUM) 40 MG capsule Take 40 mg by mouth daily.     . fenofibrate (TRICOR) 145 MG tablet TAKE 1 TABLET (145 MG TOTAL) BY MOUTH DAILY. (Patient not taking: Reported on 08/05/2016) 30 tablet 5  . fluconazole (DIFLUCAN) 150 MG tablet Take 1 tablet (150 mg total) by mouth 2 (two) times daily. (Patient not taking: Reported on 08/05/2016) 30 tablet 0  . furosemide (LASIX) 20 MG tablet TAKE 1 TABLET BY MOUTH AS NEEDED (Patient not taking: Reported on 08/05/2016) 15 tablet 1  . glucose blood (FREESTYLE LITE) test strip Use as instructed to check blood sugar 3 times per day dx code E11.65 100 each 5  . insulin aspart (NOVOLOG) 100 UNIT/ML injection Use per v-go pump 76 units daily 30 mL 2  . Insulin Disposable Pump (V-GO 40) KIT Use 1 device per day. 1 kit 3  . INVOKANA 300 MG TABS tablet TAKE 300 MG BY MOUTH DAILY BEFORE BREAKFAST. 30 tablet 3  . Lancets (FREESTYLE) lancets Use as instructed to check blood sugar 3 times per day dx code E11.65 100 each 5  . oxyCODONE (OXY IR/ROXICODONE) 5 MG immediate release tablet Take 5 mg by mouth 2 (two) times daily as needed for moderate pain or severe pain.     [name] oxyCODONE-acetaminophen (PERCOCET) 10-325 MG per tablet Take 1 tablet by mouth  3 (three) times daily as needed for pain.    . rizatriptan (MAXALT) 10 MG tablet Take 1 tablet (10 mg total) by mouth as needed for migraine. May repeat in 2 hours if needed (Patient not taking: Reported on 08/05/2016) 10 tablet 3  . spironolactone (ALDACTONE) 50 MG tablet Take 1 tablet (50 mg total) by mouth daily. 30 tablet 2  . tamsulosin (FLOMAX) 0.4 MG CAPS capsule Take 0.4 mg by mouth daily.    . TRULICITY 7.89 FY/1.0FB SOPN Inject 5 mLs as directed once a week.     . Vortioxetine HBr (TRINTELLIX) 20 MG TABS Take 20 mg by mouth daily.     . Vortioxetine HBr 5 MG  TABS Take 5 mg by mouth daily.     No current facility-administered medications on file prior to visit.    Allergies  Allergen Reactions  . Latex Itching, Rash and Other (See Comments)    Pt states she cannot use condoms - cause an infection.  Use of latex on skin is okay.  Tape causes rash  . Ibuprofen Other (See Comments)    headaches  . Sweetening Enhancer [Flavoring Agent] Nausea And Vomiting    And headaches  . Tetracyclines & Related Nausea And Vomiting    Yeast infections  . Trazodone And Nefazodone Other (See Comments)    Hallucinations   . Triazolam Other (See Comments)    hallucinations  . Aspartame And Phenylalanine Nausea And Vomiting    headaches    Recent Results (from the past 2160 hour(s))  Basic metabolic panel     Status: Abnormal   Collection Time: 05/25/16  2:14 PM  Result Value Ref Range   Sodium 132 (L) 135 - 145 mEq/L   Potassium 3.3 (L) 3.5 - 5.1 mEq/L   Chloride 99 96 - 112 mEq/L   CO2 25 19 - 32 mEq/L   Glucose, Bld 363 (H) 70 - 99 mg/dL   BUN 8 6 - 23 mg/dL   Creatinine, Ser 0.85 0.40 - 1.20 mg/dL   Calcium 8.9 8.4 - 10.5 mg/dL   GFR 74.94 >60.00 mL/min  Hemoglobin A1c     Status: Abnormal   Collection Time: 05/25/16  2:14 PM  Result Value Ref Range   Hgb A1c MFr Bld 10.2 (H) 4.6 - 6.5 %    Comment: Glycemic Control Guidelines for People with Diabetes:Non Diabetic:  <6%Goal of Therapy: <7%Additional Action Suggested:  >8%   Lipid panel     Status: Abnormal   Collection Time: 05/25/16  2:14 PM  Result Value Ref Range   Cholesterol 95 0 - 200 mg/dL    Comment: ATP III Classification       Desirable:  < 200 mg/dL               Borderline High:  200 - 239 mg/dL          High:  > = 240 mg/dL   Triglycerides 307.0 (H) 0.0 - 149.0 mg/dL    Comment: Normal:  <150 mg/dLBorderline High:  150 - 199 mg/dL   HDL 28.30 (L) >39.00 mg/dL   VLDL 61.4 (H) 0.0 - 40.0 mg/dL   Total CHOL/HDL Ratio 3     Comment:                Men          Women1/2 Average  Risk     3.4          3.3Average Risk  5.0          4.42X Average Risk          9.6          7.13X Average Risk          15.0          11.0                       NonHDL 66.47     Comment: NOTE:  Non-HDL goal should be 30 mg/dL higher than patient's LDL goal (i.e. LDL goal of < 70 mg/dL, would have non-HDL goal of < 100 mg/dL)  LDL cholesterol, direct     Status: None   Collection Time: 05/25/16  2:14 PM  Result Value Ref Range   Direct LDL 40.0 mg/dL    Comment: Optimal:  <100 mg/dLNear or Above Optimal:  100-129 mg/dLBorderline High:  130-159 mg/dLHigh:  160-189 mg/dLVery High:  >190 mg/dL  WOUND CULTURE     Status: None   Collection Time: 06/21/16  5:21 PM  Result Value Ref Range   Gram Stain No WBC Seen    Gram Stain No Squamous Epithelial Cells Seen    Gram Stain No Organisms Seen    Organism ID, Bacteria Multiple Organisms Present,None Predominant     Comment: No Staphylococcus aureus isolated NO GROUP A STREP (S. PYOGENES) ISOLATED   Basic metabolic panel     Status: Abnormal   Collection Time: 06/22/16 12:28 AM  Result Value Ref Range   Sodium 135 135 - 145 mmol/L   Potassium 3.5 3.5 - 5.1 mmol/L   Chloride 102 101 - 111 mmol/L   CO2 26 22 - 32 mmol/L   Glucose, Bld 208 (H) 65 - 99 mg/dL   BUN <5 (L) 6 - 20 mg/dL   Creatinine, Ser 0.66 0.44 - 1.00 mg/dL   Calcium 9.0 8.9 - 10.3 mg/dL   GFR calc non Af Amer >60 >60 mL/min   GFR calc Af Amer >60 >60 mL/min    Comment: (NOTE) The eGFR has been calculated using the CKD EPI equation. This calculation has not been validated in all clinical situations. eGFR's persistently <60 mL/min signify possible Chronic Kidney Disease.    Anion gap 7 5 - 15  CBC with Differential     Status: Abnormal   Collection Time: 06/22/16 12:28 AM  Result Value Ref Range   WBC 11.1 (H) 4.0 - 10.5 K/uL   RBC 4.03 3.87 - 5.11 MIL/uL   Hemoglobin 12.1 12.0 - 15.0 g/dL   HCT 36.5 36.0 - 46.0 %   MCV 90.6 78.0 - 100.0 fL   MCH 30.0 26.0 -  34.0 pg   MCHC 33.2 30.0 - 36.0 g/dL   RDW 12.9 11.5 - 15.5 %   Platelets 299 150 - 400 K/uL   Neutrophils Relative % 64 %   Neutro Abs 7.1 1.7 - 7.7 K/uL   Lymphocytes Relative 29 %   Lymphs Abs 3.3 0.7 - 4.0 K/uL   Monocytes Relative 5 %   Monocytes Absolute 0.5 0.1 - 1.0 K/uL   Eosinophils Relative 1 %   Eosinophils Absolute 0.1 0.0 - 0.7 K/uL   Basophils Relative 1 %   Basophils Absolute 0.1 0.0 - 0.1 K/uL  Basic metabolic panel     Status: Abnormal   Collection Time: 06/24/16  3:17 PM  Result Value Ref Range   Sodium 138 135 - 145 mEq/L   Potassium 3.3 (L)  3.5 - 5.1 mEq/L   Chloride 101 96 - 112 mEq/L   CO2 27 19 - 32 mEq/L   Glucose, Bld 224 (H) 70 - 99 mg/dL   BUN 6 6 - 23 mg/dL   Creatinine, Ser 0.86 0.40 - 1.20 mg/dL   Calcium 9.0 8.4 - 10.5 mg/dL   GFR 73.91 >60.00 mL/min  Fructosamine     Status: None   Collection Time: 06/24/16  3:17 PM  Result Value Ref Range   Fructosamine 268 0 - 285 umol/L    Comment: Published reference interval for apparently healthy subjects between age 41 and 59 is 68 - 285 umol/L and in a poorly controlled diabetic population is 228 - 563 umol/L with a mean of 396 umol/L.   CBC with Differential     Status: Abnormal   Collection Time: 07/29/16  2:49 PM  Result Value Ref Range   WBC 12.7 (H) 3.9 - 10.3 10e3/uL   NEUT# 9.3 (H) 1.5 - 6.5 10e3/uL   HGB 12.2 11.6 - 15.9 g/dL   HCT 36.8 34.8 - 46.6 %   Platelets 239 145 - 400 10e3/uL   MCV 90.6 79.5 - 101.0 fL   MCH 30.0 25.1 - 34.0 pg   MCHC 33.2 31.5 - 36.0 g/dL   RBC 4.06 3.70 - 5.45 10e6/uL   RDW 13.9 11.2 - 14.5 %   lymph# 2.6 0.9 - 3.3 10e3/uL   MONO# 0.6 0.1 - 0.9 10e3/uL   Eosinophils Absolute 0.1 0.0 - 0.5 10e3/uL   Basophils Absolute 0.1 0.0 - 0.1 10e3/uL   NEUT% 73.7 38.4 - 76.8 %   LYMPH% 20.5 14.0 - 49.7 %   MONO% 4.7 0.0 - 14.0 %   EOS% 0.5 0.0 - 7.0 %   BASO% 0.6 0.0 - 2.0 %  CEA (IN HOUSE-[facility])     Status: None   Collection Time: 07/29/16  2:49 PM  Result  Value Ref Range   CEA ([facility]-In House) 2.87 0.00 - 5.00 ng/mL    Comment: This test was performed using [employer] Chemiluminescent Microparticle Immunoassay. Values obtained from different assay methods cannot be used interchageably. Please note that 5-10% of patients who smoke may see CEA levels up to 6.9 ng/mL.  CEA     Status: None   Collection Time: 07/29/16  2:49 PM  Result Value Ref Range   CEA 2.8 0.0 - 4.7 ng/mL    Comment:                       Roche ECLIA methodology       Nonsmokers  <3.9                                                     Smokers     <5.6   Comprehensive metabolic panel     Status: Abnormal   Collection Time: 07/29/16  2:49 PM  Result Value Ref Range   Sodium 132 (L) 136 - 145 mEq/L   Potassium 3.6 3.5 - 5.1 mEq/L   Chloride 98 98 - 109 mEq/L   CO2 21 (L) 22 - 29 mEq/L   Glucose 499 (H) 70 - 140 mg/dl    Comment: Glucose reference range is for nonfasting patients. Fasting glucose reference range is 70- 100.   BUN 5.4 (L) 7.0 - 26.0  mg/dL   Creatinine 1.0 0.6 - 1.1 mg/dL   Total Bilirubin <0.30 0.20 - 1.20 mg/dL   Alkaline Phosphatase 107 40 - 150 U/L   AST 11 5 - 34 U/L   ALT 13 0 - 55 U/L   Total Protein 6.8 6.4 - 8.3 g/dL   Albumin 3.5 3.5 - 5.0 g/dL   Calcium 9.2 8.4 - 10.4 mg/dL   Anion Gap 13 (H) 3 - 11 mEq/L   EGFR 64 (L) >90 ml/min/1.73 m2    Comment: eGFR is calculated using the CKD-EPI Creatinine Equation (2009)    Objective: There were no vitals filed for this visit.  General: Patient is awake, alert, oriented x 3 and in no acute distress. Flat affect and poor hygiene habits.  Dermatology: Skin is warm and dry bilateral with a medial midfoot ulceration present and ulceration present medial aspect of 5th toe on right foot. Medial midfoot Ulceration measures 5x4cm and ulceration medial right 5 toe measures 0.2x0.2cm (same as prior). There is a macerated border with a granular base with necrotic center at medial midfoot with sloft present.  The ulcerations do not probe to bone. There is no malodor, + active drainage, focal erythema, + focal edema. No other acute signs of infection.   Vascular: Dorsalis Pedis pulse = 1/4 Bilateral,  Posterior Tibial pulse = 0/4 right due to edema, 1/4 on left,  Capillary Fill Time < 5 seconds  Neurologic: Protective sensation absent bilateral.  Musculosketal: There is decreased ankle joint range of motion Bilateral. There is decreased Subtalar joint range of motion Bilateral. There is a decrease in 1st metatarsophalangeal joint range of motion Bilateral. Mild Pain with palpation to ulcerated areas. + pain to palpation right>left foot. + Pes planus foot type with charcot on right midfoot collapse and widening of right foot.   Assessment and Plan:  Problem List Items Addressed This Visit    None    Visit Diagnoses    Diabetic Charcot's foot ([place])    -  Primary   Relevant Orders   WOUND CULTURE   Diabetic ulcer of right foot associated with type 2 diabetes mellitus ([place])       Relevant Orders   WOUND CULTURE   Right foot pain       Cellulitis and abscess of foot       Relevant Medications   levofloxacin (LEVAQUIN) 500 MG tablet     -Examined patient and discussed the progression of the wounds and treatment alternatives. -MRI reviewed consistent with Charcot  -Excisionally dedbrided ulcerations to healthy bleeding borders using a sterile tissue nipper and chisel blade. -Applied betadine and iodosorb and dry sterile dressing to ulcerations. Rx home wound care nursing  -Wound culture sent; will call patient if change of antibiotic is warranted  -Recommend and dispensed post op shoe for ulcer and charcot. Advised padding to prevent re-ulceration  -Rx Levaquin to take as instructed -Patient to return to office 1-2 weeks for follow up ulcer care and evaluation or sooner if problems arise.   Landis Martins, DPM

## 2016-08-08 NOTE — Telephone Encounter (Signed)
-----   Message from Elayne Snare, MD sent at 08/01/2016  9:18 AM EDT ----- Please ask her about her blood sugar level and if she is doing the pump.  Her glucose was almost 500 with Dr. [name]  ----- Message ----- From: Jesse Fall, RN Sent: 07/29/2016   4:13 PM To: Elayne Snare, MD  Note blood sugar reading-?not fasting, Thanks  Jesse Fall RN/Dr [name]

## 2016-08-08 NOTE — Telephone Encounter (Signed)
I contacted the pt and advised of note from MD. Pt stated she has been using her v-go pump, but she stated she has not been checking her blood sugar levels. Pt was advised to start checking her blood sugars so we would know her readings at her up coming appointment. Pt voiced understanding.

## 2016-08-09 ENCOUNTER — Ambulatory Visit: Payer: Medicare Other | Admitting: Sports Medicine

## 2016-08-09 NOTE — Telephone Encounter (Addendum)
-----   Message from Landis Martins, [place] sent at 08/08/2016  3:52 PM EDT ----- Regarding: Home wound care nursing Right midfoot ulceration and right 4th interspace ulceration Apply betadine to sites 3x per week Patient states that nurse must be ok with Dogs Thanks Dr. Cannon Kettle. Referral to [facility] faxed to [phone], with required form, clinicals and demographics. 08/12/2016-Renee - [facility] [phone] ask for clarification of wound care, or is she just to apply betadine to the wound. Left message informing Joseph Art- [facility] to dress wounds with betadine, 4x4 gauze, kerlix and ace wrap. 08/26/2016-Renee - [facility] asked the date of pt's surgery. 10/11/2016-Tracy - [facility] states she would like an order for Social Service to help pt with resources in her area, psychiatrist, and counselor. Dr. Marcene Duos the request and I called orders toTracy.

## 2016-08-12 NOTE — Telephone Encounter (Signed)
Dressing changes should consist of betadine to wounds, covered with 4x4, kerlix, and ace wrap. -Dr. Cannon Kettle

## 2016-08-13 ENCOUNTER — Other Ambulatory Visit: Payer: Self-pay | Admitting: Endocrinology

## 2016-08-15 ENCOUNTER — Ambulatory Visit: Payer: Medicare Other | Admitting: Sports Medicine

## 2016-08-23 ENCOUNTER — Encounter: Payer: Self-pay | Admitting: Sports Medicine

## 2016-08-23 ENCOUNTER — Ambulatory Visit (INDEPENDENT_AMBULATORY_CARE_PROVIDER_SITE_OTHER): Payer: Medicare Other | Admitting: Sports Medicine

## 2016-08-23 DIAGNOSIS — L89891 Pressure ulcer of other site, stage 1: Secondary | ICD-10-CM

## 2016-08-23 DIAGNOSIS — E11610 Type 2 diabetes mellitus with diabetic neuropathic arthropathy: Secondary | ICD-10-CM

## 2016-08-23 DIAGNOSIS — E11621 Type 2 diabetes mellitus with foot ulcer: Secondary | ICD-10-CM

## 2016-08-23 DIAGNOSIS — L97519 Non-pressure chronic ulcer of other part of right foot with unspecified severity: Principal | ICD-10-CM

## 2016-08-23 DIAGNOSIS — M79671 Pain in right foot: Secondary | ICD-10-CM | POA: Diagnosis not present

## 2016-08-23 NOTE — Progress Notes (Signed)
Patient ID: [name], female   DOB: 01-Nov-1966, 50 y.o.   MRN: [id number]  Subjective: [name] is a 50 y.o. female patient seen in office for evaluation of ulceration of the right foot. Patient has home nursing apply betadine to sites. Completed Levaquin . Denies nausea/fever/vomiting/night sweats/shortness of breath. Admits leg and foot pain with continued swelling and collapse. Patient has no other pedal complaints at this time.  Patient Active Problem List   Diagnosis Date Noted  . Cancer of sigmoid colon ([place]) 03/09/2015  . OSA (obstructive sleep apnea) 07/04/2014  . Migraine without aura, with intractable migraine, so stated, without mention of status migrainosus 03/26/2014  . Peripheral neuropathy ([place]) 03/03/2014  . Abnormal stress test 02/12/2014  . CVA (cerebral infarction) 02/12/2014  . Chest pain   . Abnormal heart rhythm   . Edema   . Hypertension   . Hyperlipemia   . Hypercholesterolemia   . Other and unspecified hyperlipidemia 11/14/2013  . Type II or unspecified type diabetes mellitus without mention of complication, uncontrolled 07/22/2013  . Unspecified essential hypertension 07/22/2013  . 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 Diabetic polyneuropathy ([place]) 07/22/2013   Current Outpatient Prescriptions on File Prior to Visit  Medication Sig Dispense Refill  . amphetamine-dextroamphetamine (ADDERALL) 30 MG tablet Take 30 mg by mouth 2 (two) times daily.   0  . atorvastatin (LIPITOR) 40 MG tablet Take 1 tablet (40 mg total) by mouth daily at 6 PM. (Patient not taking: Reported on 08/05/2016) 90 tablet 3  . CHANTIX 0.5 MG tablet Take 1 mg by mouth daily. Reported on 05/27/2016    . chlorproMAZINE (THORAZINE) 200 MG tablet Take 200 mg by mouth at bedtime.    . cyclobenzaprine (FLEXERIL) 10 MG tablet Take 10 mg by mouth at bedtime as needed for muscle spasms.   0  . diazepam (VALIUM) 10 MG tablet  Take 10 mg by mouth at bedtime as needed for anxiety or sleep.     [name] esomeprazole (NEXIUM) 40 MG capsule Take 40 mg by mouth daily.     . fenofibrate (TRICOR) 145 MG tablet TAKE 1 TABLET (145 MG TOTAL) BY MOUTH DAILY. (Patient not taking: Reported on 08/05/2016) 30 tablet 5  . fluconazole (DIFLUCAN) 150 MG tablet Take 1 tablet (150 mg total) by mouth 2 (two) times daily. (Patient not taking: Reported on 08/05/2016) 30 tablet 0  . furosemide (LASIX) 20 MG tablet TAKE 1 TABLET BY MOUTH AS NEEDED 15 tablet 1  . glucose blood (FREESTYLE LITE) test strip Use as instructed to check blood sugar 3 times per day dx code E11.65 100 each 5  . insulin aspart (NOVOLOG) 100 UNIT/ML injection Use per v-go pump 76 units daily 30 mL 2  . Insulin Disposable Pump (V-GO 40) KIT Use 1 device per day. 1 kit 3  . INVOKANA 300 MG TABS tablet TAKE 300 MG BY MOUTH DAILY BEFORE BREAKFAST. 30 tablet 3  . Lancets (FREESTYLE) lancets Use as instructed to check blood sugar 3 times per day dx code E11.65 100 each 5  . levofloxacin (LEVAQUIN) 500 MG tablet Take 1 tablet (500 mg total) by mouth daily. 14 tablet 0  . oxyCODONE (OXY IR/ROXICODONE) 5 MG immediate release tablet Take 5 mg by mouth 2 (two) times daily as needed for moderate pain or severe pain.     [name] oxyCODONE-acetaminophen (PERCOCET) 10-325 MG per tablet Take 1 tablet by mouth 3 (three) times daily as  needed for pain.    . rizatriptan (MAXALT) 10 MG tablet Take 1 tablet (10 mg total) by mouth as needed for migraine. May repeat in 2 hours if needed (Patient not taking: Reported on 08/05/2016) 10 tablet 3  . spironolactone (ALDACTONE) 50 MG tablet Take 1 tablet (50 mg total) by mouth daily. 30 tablet 2  . tamsulosin (FLOMAX) 0.4 MG CAPS capsule Take 0.4 mg by mouth daily.    . TRULICITY 5.05 LZ/7.6BH SOPN Inject 5 mLs as directed once a week.     . Vortioxetine HBr (TRINTELLIX) 20 MG TABS Take 20 mg by mouth daily.     . Vortioxetine HBr 5 MG TABS Take 5 mg by mouth daily.      No current facility-administered medications on file prior to visit.    Allergies  Allergen Reactions  . Latex Itching, Rash and Other (See Comments)    Pt states she cannot use condoms - cause an infection.  Use of latex on skin is okay.  Tape causes rash  . Ibuprofen Other (See Comments)    headaches  . Sweetening Enhancer [Flavoring Agent] Nausea And Vomiting    And headaches  . Tetracyclines & Related Nausea And Vomiting    Yeast infections  . Trazodone And Nefazodone Other (See Comments)    Hallucinations   . Triazolam Other (See Comments)    hallucinations  . Aspartame And Phenylalanine Nausea And Vomiting    headaches    Recent Results (from the past 2160 hour(s))  WOUND CULTURE     Status: None   Collection Time: 06/21/16  5:21 PM  Result Value Ref Range   Gram Stain No WBC Seen    Gram Stain No Squamous Epithelial Cells Seen    Gram Stain No Organisms Seen    Organism ID, Bacteria Multiple Organisms Present,None Predominant     Comment: No Staphylococcus aureus isolated NO GROUP A STREP (S. PYOGENES) ISOLATED   Basic metabolic panel     Status: Abnormal   Collection Time: 06/22/16 12:28 AM  Result Value Ref Range   Sodium 135 135 - 145 mmol/L   Potassium 3.5 3.5 - 5.1 mmol/L   Chloride 102 101 - 111 mmol/L   CO2 26 22 - 32 mmol/L   Glucose, Bld 208 (H) 65 - 99 mg/dL   BUN <5 (L) 6 - 20 mg/dL   Creatinine, Ser 0.66 0.44 - 1.00 mg/dL   Calcium 9.0 8.9 - 10.3 mg/dL   GFR calc non Af Amer >60 >60 mL/min   GFR calc Af Amer >60 >60 mL/min    Comment: (NOTE) The eGFR has been calculated using the CKD EPI equation. This calculation has not been validated in all clinical situations. eGFR's persistently <60 mL/min signify possible Chronic Kidney Disease.    Anion gap 7 5 - 15  CBC with Differential     Status: Abnormal   Collection Time: 06/22/16 12:28 AM  Result Value Ref Range   WBC 11.1 (H) 4.0 - 10.5 K/uL   RBC 4.03 3.87 - 5.11 MIL/uL   Hemoglobin 12.1  12.0 - 15.0 g/dL   HCT 36.5 36.0 - 46.0 %   MCV 90.6 78.0 - 100.0 fL   MCH 30.0 26.0 - 34.0 pg   MCHC 33.2 30.0 - 36.0 g/dL   RDW 12.9 11.5 - 15.5 %   Platelets 299 150 - 400 K/uL   Neutrophils Relative % 64 %   Neutro Abs 7.1 1.7 - 7.7 K/uL   Lymphocytes  Relative 29 %   Lymphs Abs 3.3 0.7 - 4.0 K/uL   Monocytes Relative 5 %   Monocytes Absolute 0.5 0.1 - 1.0 K/uL   Eosinophils Relative 1 %   Eosinophils Absolute 0.1 0.0 - 0.7 K/uL   Basophils Relative 1 %   Basophils Absolute 0.1 0.0 - 0.1 K/uL  Basic metabolic panel     Status: Abnormal   Collection Time: 06/24/16  3:17 PM  Result Value Ref Range   Sodium 138 135 - 145 mEq/L   Potassium 3.3 (L) 3.5 - 5.1 mEq/L   Chloride 101 96 - 112 mEq/L   CO2 27 19 - 32 mEq/L   Glucose, Bld 224 (H) 70 - 99 mg/dL   BUN 6 6 - 23 mg/dL   Creatinine, Ser 0.86 0.40 - 1.20 mg/dL   Calcium 9.0 8.4 - 10.5 mg/dL   GFR 73.91 >60.00 mL/min  Fructosamine     Status: None   Collection Time: 06/24/16  3:17 PM  Result Value Ref Range   Fructosamine 268 0 - 285 umol/L    Comment: Published reference interval for apparently healthy subjects between age 65 and 85 is 73 - 285 umol/L and in a poorly controlled diabetic population is 228 - 563 umol/L with a mean of 396 umol/L.   CBC with Differential     Status: Abnormal   Collection Time: 07/29/16  2:49 PM  Result Value Ref Range   WBC 12.7 (H) 3.9 - 10.3 10e3/uL   NEUT# 9.3 (H) 1.5 - 6.5 10e3/uL   HGB 12.2 11.6 - 15.9 g/dL   HCT 36.8 34.8 - 46.6 %   Platelets 239 145 - 400 10e3/uL   MCV 90.6 79.5 - 101.0 fL   MCH 30.0 25.1 - 34.0 pg   MCHC 33.2 31.5 - 36.0 g/dL   RBC 4.06 3.70 - 5.45 10e6/uL   RDW 13.9 11.2 - 14.5 %   lymph# 2.6 0.9 - 3.3 10e3/uL   MONO# 0.6 0.1 - 0.9 10e3/uL   Eosinophils Absolute 0.1 0.0 - 0.5 10e3/uL   Basophils Absolute 0.1 0.0 - 0.1 10e3/uL   NEUT% 73.7 38.4 - 76.8 %   LYMPH% 20.5 14.0 - 49.7 %   MONO% 4.7 0.0 - 14.0 %   EOS% 0.5 0.0 - 7.0 %   BASO% 0.6 0.0 - 2.0 %   CEA (IN HOUSE-[facility])     Status: None   Collection Time: 07/29/16  2:49 PM  Result Value Ref Range   CEA ([facility]-In House) 2.87 0.00 - 5.00 ng/mL    Comment: This test was performed using [employer]'s Chemiluminescent Microparticle Immunoassay. Values obtained from different assay methods cannot be used interchageably. Please note that 5-10% of patients who smoke may see CEA levels up to 6.9 ng/mL.  CEA     Status: None   Collection Time: 07/29/16  2:49 PM  Result Value Ref Range   CEA 2.8 0.0 - 4.7 ng/mL    Comment:                       Roche ECLIA methodology       Nonsmokers  <3.9                                                     Smokers     <5.6  Comprehensive metabolic panel     Status: Abnormal   Collection Time: 07/29/16  2:49 PM  Result Value Ref Range   Sodium 132 (L) 136 - 145 mEq/L   Potassium 3.6 3.5 - 5.1 mEq/L   Chloride 98 98 - 109 mEq/L   CO2 21 (L) 22 - 29 mEq/L   Glucose 499 (H) 70 - 140 mg/dl    Comment: Glucose reference range is for nonfasting patients. Fasting glucose reference range is 70- 100.   BUN 5.4 (L) 7.0 - 26.0 mg/dL   Creatinine 1.0 0.6 - 1.1 mg/dL   Total Bilirubin <0.30 0.20 - 1.20 mg/dL   Alkaline Phosphatase 107 40 - 150 U/L   AST 11 5 - 34 U/L   ALT 13 0 - 55 U/L   Total Protein 6.8 6.4 - 8.3 g/dL   Albumin 3.5 3.5 - 5.0 g/dL   Calcium 9.2 8.4 - 10.4 mg/dL   Anion Gap 13 (H) 3 - 11 mEq/L   EGFR 64 (L) >90 ml/min/1.73 m2    Comment: eGFR is calculated using the CKD-EPI Creatinine Equation (2009)    Objective: There were no vitals filed for this visit.  General: Patient is awake, alert, oriented x 3 and in no acute distress. Flat affect and poor hygiene habits.  Dermatology: Skin is warm and dry bilateral with a medial midfoot ulceration present and ulceration present medial aspect of 5th toe on right foot. Medial midfoot Ulceration measures 4x3cm (smalller than previous) and ulceration right 5 toe measures 0.2x0.2cm (same as prior). There  is a decreased macerated border with a granular base with fibrotic center at medial midfoot with decreased sloft present. The ulcerations do not probe to bone. There is no malodor, no active drainage, resolved focal erythema, + focal edema. No other acute signs of infection.   Vascular: Dorsalis Pedis pulse = 1/4 Bilateral,  Posterior Tibial pulse = 0/4 right due to edema, 1/4 on left,  Capillary Fill Time < 5 seconds  Neurologic: Protective sensation absent bilateral.  Musculosketal: There is decreased ankle joint range of motion Bilateral. There is decreased Subtalar joint range of motion Bilateral. There is a decrease in 1st metatarsophalangeal joint range of motion Bilateral. Mild Pain with palpation to ulcerated areas. + pain to palpation right>left foot. + Pes planus foot type with charcot on right midfoot collapse and widening of right foot.   Assessment and Plan:  Problem List Items Addressed This Visit    None    Visit Diagnoses    Diabetic ulcer of right foot associated with type 2 diabetes mellitus ([place])    -  Primary   Diabetic Charcot's foot ([place])       Right foot pain         -Examined patient and discussed the progression of the wounds and treatment alternatives. -Cleansed ulceration Applied antibiotic cream and dry sterile dressing to ulcerations. Continue home wound care nursing using betadine -Continue post op shoe for ulcer and charcot. Advised padding to prevent worsening -Patient opt for surgical management. Consent obtained for right wound debridement and application of graft (stravix). Pre and Post op course explained. Risks, benefits, alternatives explained. No guarantees given or implied. Surgical booking slip submitted and provided patient with Surgical packet and info for [facility] completed -Patient to return to office after surgery or sooner if problems arise.   Landis Martins, DPM

## 2016-08-23 NOTE — Patient Instructions (Signed)
Pre-Operative Instructions  Congratulations, you have decided to take an important step to improving your quality of life.  You can be assured that the doctors of [facility] will be with you every step of the way.  1. Plan to be at the surgery center/hospital at least 1 (one) hour prior to your scheduled time unless otherwise directed by the surgical center/hospital staff.  You must have a responsible adult accompany you, remain during the surgery and drive you home.  Make sure you have directions to the surgical center/hospital and know how to get there on time. 2. For hospital based surgery you will need to obtain a history and physical form from your family physician within 1 month prior to the date of surgery- we will give you a form for you primary physician.  3. We make every effort to accommodate the date you request for surgery.  There are however, times where surgery dates or times have to be moved.  We will contact you as soon as possible if a change in schedule is required.   4. No Aspirin/Ibuprofen for one week before surgery.  If you are on aspirin, any non-steroidal anti-inflammatory medications (Mobic, Aleve, Ibuprofen) you should stop taking it 7 days prior to your surgery.  You make take Tylenol  For pain prior to surgery.  5. Medications- If you are taking daily heart and blood pressure medications, seizure, reflux, allergy, asthma, anxiety, pain or diabetes medications, make sure the surgery center/hospital is aware before the day of surgery so they may notify you which medications to take or avoid the day of surgery. 6. No food or drink after midnight the night before surgery unless directed otherwise by surgical center/hospital staff. 7. No alcoholic beverages 24 hours prior to surgery.  No smoking 24 hours prior to or 24 hours after surgery. 8. Wear loose pants or shorts- loose enough to fit over bandages, boots, and casts. 9. No slip on shoes, sneakers are best. 10. Bring  your boot with you to the surgery center/hospital.  Also bring crutches or a walker if your physician has prescribed it for you.  If you do not have this equipment, it will be provided for you after surgery. 11. If you have not been contracted by the surgery center/hospital by the day before your surgery, call to confirm the date and time of your surgery. 12. Leave-time from work may vary depending on the type of surgery you have.  Appropriate arrangements should be made prior to surgery with your employer. 13. Prescriptions will be provided immediately following surgery by your doctor.  Have these filled as soon as possible after surgery and take the medication as directed. 14. Remove nail polish on the operative foot. 15. Wash the night before surgery.  The night before surgery wash the foot and leg well with the antibacterial soap provided and water paying special attention to beneath the toenails and in between the toes.  Rinse thoroughly with water and dry well with a towel.  Perform this wash unless told not to do so by your physician.  Enclosed: 1 Ice pack (please put in freezer the night before surgery)   1 Hibiclens skin cleaner   Pre-op Instructions  If you have any questions regarding the instructions, do not hesitate to call our office.  [place]: [address] [place], [place] [address] [phone]  [place]: [address] , [place] [address] [phone]  [place]: [address]  [place], [place] [address] [phone]   Dr.   Norman Regal DPM, Dr. Matthew Wagoner DPM, Dr. M. Todd Hyatt DPM, Dr. Titorya Stover DPM 

## 2016-08-26 ENCOUNTER — Telehealth: Payer: Self-pay | Admitting: Endocrinology

## 2016-08-26 MED ORDER — CANAGLIFLOZIN 300 MG PO TABS: ORAL_TABLET | ORAL | 3 refills | Status: DC

## 2016-08-26 MED ORDER — SPIRONOLACTONE 50 MG PO TABS: 50.0000 mg | ORAL_TABLET | Freq: Every day | ORAL | 2 refills | Status: DC

## 2016-08-26 NOTE — Telephone Encounter (Signed)
[facility] [phone] Potassium, spironolactone and invokana needs to be called in

## 2016-08-26 NOTE — Telephone Encounter (Signed)
I contacted the patient about the potassium refill listed in the message. Patient stated she did not need this refill a this time because she is not currently taking it. Refill for spironolactone and invokana sent.

## 2016-08-30 ENCOUNTER — Other Ambulatory Visit: Payer: Self-pay

## 2016-08-30 ENCOUNTER — Other Ambulatory Visit: Payer: Medicare Other

## 2016-08-30 MED ORDER — CANAGLIFLOZIN 300 MG PO TABS: ORAL_TABLET | ORAL | 3 refills | Status: DC

## 2016-08-31 ENCOUNTER — Telehealth: Payer: Self-pay | Admitting: *Deleted

## 2016-08-31 NOTE — Telephone Encounter (Signed)
I'm calling to let you know I received a call from [place] at [facility].  She stated you will not be able to have surgery there due to your health issues.  So we rescheduled you to [facility] OR.  "Where is that?"  It in the hospital.  You enter the new part of hospital, go to desk and ask them where to go.  You will probably receive a call from them regarding your arrival time and what you will need to do.  They also said to make you aware that if you don't get your glucose level down you will not be able to have the procedure.  You levels have been 500 mg/dl.  "Oh okay, I have a physical tomorrow."  Great, have the doctor to fax it to me.  "I will."

## 2016-08-31 NOTE — Progress Notes (Signed)
Chart reviewed by Dr Al Corpus, due to patient multiple medical problems, she will be better served being done at [facility].

## 2016-09-01 ENCOUNTER — Telehealth: Payer: Self-pay | Admitting: *Deleted

## 2016-09-01 NOTE — Telephone Encounter (Signed)
"  Patient is here now for a physical.  She said she is scheduled for surgery on Monday.  We need the form to complete."  Form was faxed to [phone] as requested

## 2016-09-02 ENCOUNTER — Encounter (HOSPITAL_BASED_OUTPATIENT_CLINIC_OR_DEPARTMENT_OTHER): Payer: Self-pay | Admitting: *Deleted

## 2016-09-02 ENCOUNTER — Ambulatory Visit: Payer: Medicare Other | Admitting: Endocrinology

## 2016-09-02 MED ORDER — DEXTROSE 5 % IV SOLN
3.0000 g | INTRAVENOUS | Status: DC
  Filled 2016-09-02: qty 3000

## 2016-09-02 NOTE — Progress Notes (Signed)
Pt denies SOB, chest pain, and being under the care of a cardiologist. Spoke with Almyra Free, Diabetes Coordinator, regarding pt V-GO  40 insulin pump, both she and Dr. Ermalene Postin agreed that pt she keep pump on. Pt stated that she does not check her blood glucose in the AM however, pt was advised to check BS the morning of procedure when she wakes up and every 2 hours prior to arrival. Pt made aware to not take any diabetes pills ( Invokana) the morning of procedure. Pt was made aware of diabetes protocol for blood glucose < 70 and interventions. Dr. Ermalene Postin reviewed pt history; no new orders given. Pt made aware to stop otc vitamins, fish oil, herbal medications and NSAID's. Pt sounded groggy and had difficulty remembering exactly what type of insulin that she was prescribed. Please review current Insulin on DOS. Pt verbalized understanding of all pre-op instructions.

## 2016-09-05 ENCOUNTER — Encounter (HOSPITAL_COMMUNITY): Payer: Self-pay | Admitting: *Deleted

## 2016-09-05 ENCOUNTER — Ambulatory Visit (HOSPITAL_COMMUNITY): Payer: Medicare Other | Admitting: Emergency Medicine

## 2016-09-05 ENCOUNTER — Ambulatory Visit (HOSPITAL_COMMUNITY)
Admission: RE | Admit: 2016-09-05 | Discharge: 2016-09-05 | Disposition: A | Discharge status: Home / Self Care | Payer: Medicare Other | Source: Ambulatory Visit | Attending: Sports Medicine | Admitting: Sports Medicine

## 2016-09-05 ENCOUNTER — Encounter: Payer: Self-pay | Admitting: Sports Medicine

## 2016-09-05 ENCOUNTER — Encounter (HOSPITAL_COMMUNITY)
Admission: RE | Disposition: A | Discharge status: Home / Self Care | Payer: Self-pay | Source: Ambulatory Visit | Attending: Sports Medicine

## 2016-09-05 DIAGNOSIS — G4733 Obstructive sleep apnea (adult) (pediatric): Secondary | ICD-10-CM | POA: Diagnosis not present

## 2016-09-05 DIAGNOSIS — Z8052 Family history of malignant neoplasm of bladder: Secondary | ICD-10-CM | POA: Insufficient documentation

## 2016-09-05 DIAGNOSIS — E11610 Type 2 diabetes mellitus with diabetic neuropathic arthropathy: Secondary | ICD-10-CM | POA: Insufficient documentation

## 2016-09-05 DIAGNOSIS — Z91040 Latex allergy status: Secondary | ICD-10-CM | POA: Insufficient documentation

## 2016-09-05 DIAGNOSIS — Z981 Arthrodesis status: Secondary | ICD-10-CM | POA: Insufficient documentation

## 2016-09-05 DIAGNOSIS — Z85038 Personal history of other malignant neoplasm of large intestine: Secondary | ICD-10-CM | POA: Diagnosis not present

## 2016-09-05 DIAGNOSIS — Z794 Long term (current) use of insulin: Secondary | ICD-10-CM | POA: Diagnosis not present

## 2016-09-05 DIAGNOSIS — E11621 Type 2 diabetes mellitus with foot ulcer: Secondary | ICD-10-CM | POA: Diagnosis not present

## 2016-09-05 DIAGNOSIS — E785 Hyperlipidemia, unspecified: Secondary | ICD-10-CM | POA: Diagnosis not present

## 2016-09-05 DIAGNOSIS — E1142 Type 2 diabetes mellitus with diabetic polyneuropathy: Secondary | ICD-10-CM | POA: Diagnosis not present

## 2016-09-05 DIAGNOSIS — E1165 Type 2 diabetes mellitus with hyperglycemia: Secondary | ICD-10-CM | POA: Diagnosis not present

## 2016-09-05 DIAGNOSIS — M659 Synovitis and tenosynovitis, unspecified: Secondary | ICD-10-CM | POA: Diagnosis not present

## 2016-09-05 DIAGNOSIS — L97519 Non-pressure chronic ulcer of other part of right foot with unspecified severity: Secondary | ICD-10-CM | POA: Diagnosis not present

## 2016-09-05 DIAGNOSIS — G43019 Migraine without aura, intractable, without status migrainosus: Secondary | ICD-10-CM | POA: Diagnosis not present

## 2016-09-05 DIAGNOSIS — Z8249 Family history of ischemic heart disease and other diseases of the circulatory system: Secondary | ICD-10-CM | POA: Diagnosis not present

## 2016-09-05 DIAGNOSIS — E7800 Pure hypercholesterolemia, unspecified: Secondary | ICD-10-CM | POA: Diagnosis not present

## 2016-09-05 DIAGNOSIS — I10 Essential (primary) hypertension: Secondary | ICD-10-CM | POA: Insufficient documentation

## 2016-09-05 DIAGNOSIS — F17210 Nicotine dependence, cigarettes, uncomplicated: Secondary | ICD-10-CM | POA: Diagnosis not present

## 2016-09-05 DIAGNOSIS — Z833 Family history of diabetes mellitus: Secondary | ICD-10-CM | POA: Insufficient documentation

## 2016-09-05 HISTORY — DX: Type 2 diabetes mellitus with foot ulcer: L97.509

## 2016-09-05 HISTORY — PX: WOUND DEBRIDEMENT: SHX247

## 2016-09-05 HISTORY — DX: Type 2 diabetes mellitus with foot ulcer: E11.621

## 2016-09-05 LAB — CBC
HCT: 39.8 % (ref 36.0–46.0)
HEMOGLOBIN: 13 g/dL (ref 12.0–15.0)
MCH: 29.5 pg (ref 26.0–34.0)
MCHC: 32.7 g/dL (ref 30.0–36.0)
MCV: 90.2 fL (ref 78.0–100.0)
Platelets: 239 10*3/uL (ref 150–400)
RBC: 4.41 MIL/uL (ref 3.87–5.11)
RDW: 13.3 % (ref 11.5–15.5)
WBC: 11.9 10*3/uL — AB (ref 4.0–10.5)

## 2016-09-05 LAB — GLUCOSE, CAPILLARY
GLUCOSE-CAPILLARY: 160 mg/dL — AB (ref 65–99)
GLUCOSE-CAPILLARY: 132 mg/dL — AB (ref 65–99)

## 2016-09-05 LAB — BASIC METABOLIC PANEL
ANION GAP: 11 (ref 5–15)
BUN: <5 mg/dL — ABNORMAL LOW (ref 6–20)
CHLORIDE: 104 mmol/L (ref 101–111)
CO2: 24 mmol/L (ref 22–32)
Calcium: 9.5 mg/dL (ref 8.9–10.3)
Creatinine, Ser: 0.74 mg/dL (ref 0.44–1.00)
GFR calc non Af Amer: >60 mL/min (ref >60)
Glucose, Bld: 162 mg/dL — ABNORMAL HIGH (ref 65–99)
Potassium: 3 mmol/L — ABNORMAL LOW (ref 3.5–5.1)
Sodium: 139 mmol/L (ref 135–145)

## 2016-09-05 SURGERY — DEBRIDEMENT, WOUND
Anesthesia: General | Site: Foot | Laterality: Right

## 2016-09-05 MED ORDER — PROPOFOL 10 MG/ML IV BOLUS
INTRAVENOUS | Status: AC
  Filled 2016-09-05: qty 20

## 2016-09-05 MED ORDER — LIDOCAINE HCL 1 % IJ SOLN
INTRAMUSCULAR | Status: DC | PRN
  Administered 2016-09-05: 15 mL

## 2016-09-05 MED ORDER — BUPIVACAINE HCL (PF) 0.5 % IJ SOLN
INTRAMUSCULAR | Status: DC | PRN
  Administered 2016-09-05: 15 mL

## 2016-09-05 MED ORDER — MEPERIDINE HCL 25 MG/ML IJ SOLN: 6.2500 mg | INTRAMUSCULAR | Status: DC | PRN

## 2016-09-05 MED ORDER — PROMETHAZINE HCL 12.5 MG PO TABS: 12.5000 mg | ORAL_TABLET | Freq: Four times a day (QID) | ORAL | 0 refills | Status: DC | PRN

## 2016-09-05 MED ORDER — LIDOCAINE HCL (CARDIAC) 20 MG/ML IV SOLN
INTRAVENOUS | Status: DC | PRN
  Administered 2016-09-05: 50 mg via INTRAVENOUS

## 2016-09-05 MED ORDER — LIDOCAINE HCL (PF) 1 % IJ SOLN
INTRAMUSCULAR | Status: AC
  Filled 2016-09-05: qty 30

## 2016-09-05 MED ORDER — PROMETHAZINE HCL 25 MG/ML IJ SOLN: 6.2500 mg | INTRAMUSCULAR | Status: DC | PRN

## 2016-09-05 MED ORDER — FENTANYL CITRATE (PF) 100 MCG/2ML IJ SOLN: 25.0000 ug | INTRAMUSCULAR | Status: DC | PRN

## 2016-09-05 MED ORDER — BACITRACIN ZINC 500 UNIT/GM EX OINT
TOPICAL_OINTMENT | CUTANEOUS | Status: AC
  Filled 2016-09-05: qty 28.35

## 2016-09-05 MED ORDER — 0.9 % SODIUM CHLORIDE (POUR BTL) OPTIME
TOPICAL | Status: DC | PRN
  Administered 2016-09-05: 1000 mL

## 2016-09-05 MED ORDER — PROPOFOL 10 MG/ML IV BOLUS
INTRAVENOUS | Status: DC | PRN
  Administered 2016-09-05: 140 mg via INTRAVENOUS

## 2016-09-05 MED ORDER — MIDAZOLAM HCL 2 MG/2ML IJ SOLN: 0.5000 mg | Freq: Once | INTRAMUSCULAR | Status: DC | PRN

## 2016-09-05 MED ORDER — BACITRACIN ZINC 500 UNIT/GM EX OINT
TOPICAL_OINTMENT | CUTANEOUS | Status: DC | PRN
  Administered 2016-09-05: 1 via TOPICAL

## 2016-09-05 MED ORDER — FENTANYL CITRATE (PF) 100 MCG/2ML IJ SOLN
INTRAMUSCULAR | Status: DC | PRN
  Administered 2016-09-05 (×2): 50 ug via INTRAVENOUS

## 2016-09-05 MED ORDER — CHLORHEXIDINE GLUCONATE CLOTH 2 % EX PADS: 6.0000 | MEDICATED_PAD | Freq: Once | CUTANEOUS | Status: DC

## 2016-09-05 MED ORDER — BUPIVACAINE HCL (PF) 0.5 % IJ SOLN
INTRAMUSCULAR | Status: AC
  Filled 2016-09-05: qty 30

## 2016-09-05 MED ORDER — HYDROCODONE-ACETAMINOPHEN 10-325 MG PO TABS: 1.0000 | ORAL_TABLET | Freq: Four times a day (QID) | ORAL | 0 refills | Status: DC | PRN

## 2016-09-05 MED ORDER — LIDOCAINE 2% (20 MG/ML) 5 ML SYRINGE
INTRAMUSCULAR | Status: AC
  Filled 2016-09-05: qty 5

## 2016-09-05 MED ORDER — LACTATED RINGERS IV SOLN
INTRAVENOUS | Status: DC | PRN
  Administered 2016-09-05: 10:00:00 via INTRAVENOUS

## 2016-09-05 MED ORDER — FENTANYL CITRATE (PF) 100 MCG/2ML IJ SOLN
INTRAMUSCULAR | Status: AC
  Filled 2016-09-05: qty 2

## 2016-09-05 MED ORDER — LACTATED RINGERS IV SOLN
INTRAVENOUS | Status: DC
  Administered 2016-09-05: 08:00:00 via INTRAVENOUS

## 2016-09-05 MED ORDER — SODIUM CHLORIDE 0.9 % IR SOLN
Status: DC | PRN
  Administered 2016-09-05: 3000 mL

## 2016-09-05 MED ORDER — LEVOFLOXACIN 500 MG PO TABS: 500.0000 mg | ORAL_TABLET | Freq: Every day | ORAL | 0 refills | Status: DC

## 2016-09-05 MED ORDER — SODIUM CHLORIDE 0.9 % IR SOLN
Status: DC | PRN
  Administered 2016-09-05: 500 mL

## 2016-09-05 MED ORDER — DOCUSATE SODIUM 100 MG PO CAPS: 100.0000 mg | ORAL_CAPSULE | Freq: Two times a day (BID) | ORAL | 0 refills | Status: DC

## 2016-09-05 SURGICAL SUPPLY — 43 items
BANDAGE ACE 4X5 VEL STRL LF (GAUZE/BANDAGES/DRESSINGS) ×2 IMPLANT
BLADE SURG ROTATE 9660 (MISCELLANEOUS) IMPLANT
BNDG COHESIVE 4X5 TAN STRL (GAUZE/BANDAGES/DRESSINGS) ×2 IMPLANT
BNDG GAUZE ELAST 4 BULKY (GAUZE/BANDAGES/DRESSINGS) ×2 IMPLANT
CANISTER SUCTION 2500CC (MISCELLANEOUS) ×1 IMPLANT
CHLORAPREP W/TINT 26ML (MISCELLANEOUS) IMPLANT
COVER SURGICAL LIGHT HANDLE (MISCELLANEOUS) ×3 IMPLANT
DRAPE EXTREMITY T 121X128X90 (DRAPE) ×3 IMPLANT
DRAPE INCISE IOBAN 66X45 STRL (DRAPES) IMPLANT
DRAPE ORTHO SPLIT 77X108 STRL (DRAPES)
DRAPE SURG ORHT 6 SPLT 77X108 (DRAPES) IMPLANT
DRESSING HYDROCOLLOID 4X4 (GAUZE/BANDAGES/DRESSINGS) ×1 IMPLANT
DRSG ADAPTIC 3X8 NADH LF (GAUZE/BANDAGES/DRESSINGS) ×2 IMPLANT
DRSG PAD ABDOMINAL 8X10 ST (GAUZE/BANDAGES/DRESSINGS) ×3 IMPLANT
DRSG VAC ATS LRG SENSATRAC (GAUZE/BANDAGES/DRESSINGS) IMPLANT
DRSG VAC ATS MED SENSATRAC (GAUZE/BANDAGES/DRESSINGS) IMPLANT
DRSG VAC ATS SM SENSATRAC (GAUZE/BANDAGES/DRESSINGS) IMPLANT
ELECT REM PT RETURN 9FT ADLT (ELECTROSURGICAL) ×3
ELECTRODE REM PT RTRN 9FT ADLT (ELECTROSURGICAL) ×1 IMPLANT
GAUZE SPONGE 4X4 12PLY STRL (GAUZE/BANDAGES/DRESSINGS) ×2 IMPLANT
GEL ULTRASOUND 20GR AQUASONIC (MISCELLANEOUS) IMPLANT
GLOVE BIO SURGEON STRL SZ 6.5 (GLOVE) ×4 IMPLANT
GLOVE BIO SURGEONS STRL SZ 6.5 (GLOVE) ×2
GOWN STRL REUS W/ TWL LRG LVL3 (GOWN DISPOSABLE) ×2 IMPLANT
GOWN STRL REUS W/TWL LRG LVL3 (GOWN DISPOSABLE) ×6
HANDPIECE INTERPULSE COAX TIP (DISPOSABLE)
KIT BASIN OR (CUSTOM PROCEDURE TRAY) ×3 IMPLANT
KIT ROOM TURNOVER OR (KITS) ×3 IMPLANT
MATRIX WOUND MESHED 2X2 (Tissue) IMPLANT
NS IRRIG 1000ML POUR BTL (IV SOLUTION) ×3 IMPLANT
PACK GENERAL/GYN (CUSTOM PROCEDURE TRAY) ×3 IMPLANT
PAD ARMBOARD 7.5X6 YLW CONV (MISCELLANEOUS) ×6 IMPLANT
PAD NEG PRESSURE SENSATRAC (MISCELLANEOUS) IMPLANT
SET HNDPC FAN SPRY TIP SCT (DISPOSABLE) IMPLANT
SPONGE GAUZE 4X4 12PLY STER LF (GAUZE/BANDAGES/DRESSINGS) ×2 IMPLANT
SUT SILK 4 0 PS 2 (SUTURE) IMPLANT
SUT VIC AB 3-0 SH 27 (SUTURE) ×3
SUT VIC AB 3-0 SH 27XBRD (SUTURE) IMPLANT
SUT VIC AB 5-0 PS2 18 (SUTURE) IMPLANT
TOWEL OR 17X24 6PK STRL BLUE (TOWEL DISPOSABLE) IMPLANT
TOWEL OR 17X26 10 PK STRL BLUE (TOWEL DISPOSABLE) ×3 IMPLANT
UNDERPAD 30X30 (UNDERPADS AND DIAPERS) ×2 IMPLANT
WOUND MATRIX MESHED 2X2 (Tissue) ×2 IMPLANT

## 2016-09-05 NOTE — Op Note (Signed)
Surgeon: Landis Martins, DPM Assistants: None Pre-operative diagnosis: Right diabetic foot ulceration Post-operative diagnosis: Right diabetic foot ulceration at medial arch and 5th toe Procedure: Right foot wound debridement (misonix) and application of integra mesh bilayer graft (5x5cm)  Pathology: Specimen-  none            Pertinent Intra-op findings- consistent with diagnosis  Anesthesia: General with post op local block 30cc 1:1 mixture 1% lidocaine and 0.5% marcaine plain  Hemostasis: Right pneumatic ankle tourniquet/cuff at 268mmHg for x 35 mins EBL: Minimal  Materials: Integra bilayer graft, 3-0 vicryl sutures  Dressings:Bacitracin, 4x4, abd, kerlix, coban, and ace wrap to right foot  Indications for procedure: Chronic diabetic foot ulceration Procedure in Detail: Patient was brought into operating room and placed on the table in a supine position. Following IV sedation, right ankle cuff was placed and right foot was scrubbed and prepped in the normal sterile fashion, following time out was performed and then attention was directed to the right foot where the esmarch bandage was applied and tourniquet inflated. Following attention was directed to medial arch and dorsal 5th toe where fibrogranular ulcerations were present measuring 4x3cm and 1.5x1cm respectively. Using Misonix ultrasound debrider, debridement was performed at both ulceration sites. Following seeing a healthy bleeding base Integra bilayer 5x5cm wound graft was applied to both ulcerations sites and sutured in place using 3-0 vicryl. Total graft was used. Following post op block of local was administered to right ankle and then dressings were applied consisting of bacitracin ointment over graft, adaptic, 4x4, abd, kerlix, coban and ace wrap. Complications: None General Statement: Patient tolerated anesthesia and procedure well, was transferred from the OR to recovery with vital signs stable and normal vascular status intact to   Right foot as noted by immediate hyperemia to digits 1-5 on Right foot  upon deflation of ankle cuff. Patient to be discharged to home with post op meds and follow up as scheduled.   Landis Martins, DPM

## 2016-09-05 NOTE — Anesthesia Preprocedure Evaluation (Addendum)
Anesthesia Evaluation  Patient identified by MRN, date of birth, ID band Patient awake    Reviewed: Allergy & Precautions, NPO status , Patient's Chart, lab work & pertinent test results  History of Anesthesia Complications Negative for: history of anesthetic complications  Airway Mallampati: II  TM Distance: >3 FB Neck ROM: Full    Dental  (+) Dental Advisory Given   Pulmonary sleep apnea (does not use CPAP) , COPD, Current Smoker,    breath sounds clear to auscultation       Cardiovascular hypertension, Pt. on medications  Rhythm:Regular Rate:Normal  '08 cath: normal coronaries '15 Stress:  EF 47%, no ischemia '15 ECHO:  EF 45%, valves OK   Neuro/Psych  Headaches, PSYCHIATRIC DISORDERS Anxiety Depression Bipolar Disorder CVA (decreased balance ), Residual Symptoms    GI/Hepatic GERD  Medicated and Controlled,  Endo/Other  diabetes (glu 180), Insulin DependentMorbid obesity  Renal/GU      Musculoskeletal  (+) Arthritis , Osteoarthritis,    Abdominal (+) + obese,   Peds  Hematology   Anesthesia Other Findings   Reproductive/Obstetrics                            Anesthesia Physical Anesthesia Plan  ASA: III  Anesthesia Plan: General   Post-op Pain Management:    Induction: Intravenous  Airway Management Planned: LMA  Additional Equipment:   Intra-op Plan:   Post-operative Plan:   Informed Consent: I have reviewed the patients History and Physical, chart, labs and discussed the procedure including the risks, benefits and alternatives for the proposed anesthesia with the patient or authorized representative who has indicated his/her understanding and acceptance.   Dental advisory given  Plan Discussed with: CRNA and Surgeon  Anesthesia Plan Comments: (Plan routine monitors, GA- LMA OK)        Anesthesia Quick Evaluation

## 2016-09-05 NOTE — H&P (Signed)
H&P: Podiatry   HAF:BXUXYB M [name] 50 y.o. female  patient with a history of Diabetes and neuropathy seen on several occassions in office for non healing ulceration secondary to Diabetes with neuropathy and Charcot deformity. Patient has tried multiple conservative treatments and opted for Surgical management of ulceration. Patient had pre-op physical and clearance for wound debridement with graft right foot completed. Patient met this AM in pre-op holding area; informed consent reviewed and signed. All questions answered. Right foot surgical site marked.  This am patient admits to 1 episode of chills, denies any other pedal complaints. Denies Nausea/fever/vomitingnight sweats/other overnight events. Confirms NPO since midnight.   Patient Active Problem List   Diagnosis Date Noted  . Cancer of sigmoid colon ([place]) 03/09/2015  . OSA (obstructive sleep apnea) 07/04/2014  . Migraine without aura, with intractable migraine, so stated, without mention of status migrainosus 03/26/2014  . Peripheral neuropathy ([place]) 03/03/2014  . Abnormal stress test 02/12/2014  . CVA (cerebral infarction) 02/12/2014  . Chest pain   . Abnormal heart rhythm   . Edema   . Hypertension   . Hyperlipemia   . Hypercholesterolemia   . Other and unspecified hyperlipidemia 11/14/2013  . Type II or unspecified type diabetes mellitus without mention of complication, uncontrolled 07/22/2013  . Unspecified essential hypertension 07/22/2013  . 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 Diabetic polyneuropathy ([place]) 07/22/2013    No current facility-administered medications on file prior to encounter.    Current Outpatient Prescriptions on File Prior to Encounter  Medication Sig Dispense Refill  . atorvastatin (LIPITOR) 40 MG tablet Take 1 tablet (40 mg total) by mouth daily at 6 PM. 90 tablet 3  . CHANTIX 0.5 MG tablet Take 1 mg by mouth daily. Reported on  05/27/2016    . chlorproMAZINE (THORAZINE) 200 MG tablet Take 400 mg by mouth at bedtime.     . diazepam (VALIUM) 10 MG tablet Take 20 mg by mouth at bedtime.     [name] esomeprazole (NEXIUM) 40 MG capsule Take 40 mg by mouth daily.     . insulin aspart (NOVOLOG) 100 UNIT/ML injection Use per v-go pump 76 units daily 30 mL 2  . oxyCODONE (OXY IR/ROXICODONE) 5 MG immediate release tablet Take 5 mg by mouth 2 (two) times daily.     [name] oxyCODONE-acetaminophen (PERCOCET) 10-325 MG per tablet Take 1 tablet by mouth 3 (three) times daily.     [name] spironolactone (ALDACTONE) 50 MG tablet Take 1 tablet (50 mg total) by mouth daily. 30 tablet 2  . tamsulosin (FLOMAX) 0.4 MG CAPS capsule Take 0.4 mg by mouth daily.    . TRULICITY 3.38 VA/9.1BT SOPN Inject 5 mLs as directed once a week. Thursdays    . Vortioxetine HBr (TRINTELLIX) 20 MG TABS Take 25 mg by mouth daily. Takes with a 5 mg tablet to equal 25 mg    . Vortioxetine HBr 5 MG TABS Take 25 mg by mouth daily. Takes with a 20 mg tablet to equal 25 mg    . amphetamine-dextroamphetamine (ADDERALL) 30 MG tablet Take 30 mg by mouth daily.   0  . fenofibrate (TRICOR) 145 MG tablet TAKE 1 TABLET (145 MG TOTAL) BY MOUTH DAILY. 30 tablet 5  . glucose blood (FREESTYLE LITE) test strip Use as instructed to check blood sugar 3 times per day dx code E11.65 100 each 5  . Insulin Disposable Pump (V-GO 40) KIT Use 1 device per day. 1 kit  3  . Lancets (FREESTYLE) lancets Use as instructed to check blood sugar 3 times per day dx code E11.65 100 each 5  . levofloxacin (LEVAQUIN) 500 MG tablet Take 1 tablet (500 mg total) by mouth daily. 14 tablet 0  . rizatriptan (MAXALT) 10 MG tablet Take 1 tablet (10 mg total) by mouth as needed for migraine. May repeat in 2 hours if needed (Patient taking differently: Take 10 mg by mouth as needed for migraine. May repeat in 2 hours if needed) 10 tablet 3     Allergies  Allergen Reactions  . Latex Itching, Rash and Other (See Comments)     Pt states she cannot use condoms - cause an infection.  Use of latex on skin is okay.  Tape causes rash  . Sweetening Enhancer [Flavoring Agent] Nausea And Vomiting and Other (See Comments)    HEADACHES  . Tetracyclines & Related Nausea And Vomiting and Other (See Comments)    ;YEAST INFECTIONS  . Aspartame And Phenylalanine Nausea And Vomiting    HEADACHES  . Ibuprofen Other (See Comments)    HEADACHES  . Trazodone And Nefazodone Other (See Comments)    Hallucinations   . Triazolam Other (See Comments)    HALLUCINATIONS    Social History   Social History  . Marital status: Divorced    Spouse name: N/A  . Number of children: 2  . Years of education: 6th   Occupational History  . Not on file.   Social History Main Topics  . Smoking status: Current Every Day Smoker    Packs/day: 0.25    Years: 41.00    Types: Cigarettes, E-cigarettes  . Smokeless tobacco: Never Used  . Alcohol use No     Comment: denies  . Drug use: No  . Sexual activity: Yes   Other Topics Concern  . Not on file   Social History Narrative  Patient is divorced and lives alone- independent in [place], Drives   Patient has two adult children- grown son and daughter who is special needs in a home   Patient is disabled since 67    Patient has a 6th grade education.   Patient is right-handed.   Depression-medication and therapist   Patient drinks 2- 3 liters of soda and drinks tea but not everyday.    Does not routinely exercise.    Past Surgical History:  Procedure Laterality Date  . ABDOMINAL HYSTERECTOMY    . ANTERIOR CERVICAL DECOMP/DISCECTOMY FUSION  2000   C6 - 7  . CARDIAC CATHETERIZATION  05-22-2008   DR SKAINS   NO SIGNIFECANT CAD/ NORMAL LV/  EF 65%/  NO WALL MOTION ABNORMALITIES  . CARDIAC CATHETERIZATION  02/12/2014  . CARPAL TUNNEL RELEASE Right 04-25-2013  . [place]; 1992  . CYSTO N/A 04/30/2013   Procedure: CYSTOSCOPY;  Surgeon: Reece Packer, MD;  Location: [facility];  Service: Urology;  Laterality: N/A;  . CYSTOSCOPY WITH INJECTION  05/04/2012   Procedure: CYSTOSCOPY WITH INJECTION;  Surgeon: Reece Packer, MD;  Location: [place];  Service: Urology;  Laterality: N/A;  MACROPLASTIQUE INJECTION  . CYSTOSCOPY WITH INJECTION  08/28/2012   Procedure: CYSTOSCOPY WITH INJECTION;  Surgeon: Reece Packer, MD;  Location: [facility];  Service: Urology;  Laterality: N/A;  cysto and macroplastique   . FOOT SURGERY Bilateral   . HERNIA REPAIR  ?1996   "stomach"  . KNEE ARTHROSCOPY W/ ALLOGRAFT IMPANT Left    graft x 2  . KNEE SURGERY     TOTAL 8 SURG'S  . LAPAROSCOPIC SIGMOID COLECTOMY  04/22/2015  . LAPAROSCOPIC SIGMOID COLECTOMY N/A 04/22/2015   Procedure: LAPAROSCOPIC HAND ASSISTED SIGMOID COLECTOMY;  Surgeon: Erroll Luna, MD;  Location: [place];  Service: General;  Laterality: N/A;  . LEFT HEART CATHETERIZATION WITH CORONARY ANGIOGRAM N/A 02/12/2014   Procedure: LEFT HEART CATHETERIZATION WITH CORONARY ANGIOGRAM;  Surgeon: Candee Furbish, MD;  Location: [facility];  Service: Cardiovascular;  Laterality: N/A;  . LUMBAR FUSION     "cage in my spine"  . MULTIPLE LAPAROSCOPIES FOR ENDOMETRIOSIS    . PUBOVAGINAL SLING  12/29/2011   Procedure: Gaynelle Arabian;  Surgeon: Reece Packer, MD;  Location: [facility];  Service: Urology;  Laterality: N/A;  cysto and sparc sling   . PUBOVAGINAL SLING N/A 04/30/2013   Procedure: REMOVAL OF VAGINAL MESH;  Surgeon: Reece Packer, MD;  Location: [facility];  Service: Urology;  Laterality: N/A;  . RECONSTURCTION OF CONGENITAL UTERUS ANOMALY  1983  . REPEAT RECONSTRUCTION ACL LEFT KNEE/ SCREWS REMOVED  03-28-2000   CADAVER GRAFT  . TOTAL ABDOMINAL HYSTERECTOMY W/ BILATERAL SALPINGOOPHORECTOMY  1997    Family History  Problem Relation Age of Onset  . Hypertension Mother   . Diabetes Mother   . Cancer - Other Mother     lymphoma   . Cancer - Other Father     lung, bladder cancer   . Heart attack Father   . Cancer - Other Brother     bladder cancer      REVIEW OF SYSTEMS: Neurologic: Denies vertigo, syncope, convulsions or current headaches. Musculoskeletal: No muscle or joint pain. Cardiorespiratory:  Denies shortness of breath, dyspnea on exertion, chest pain, cough or hemoptysis. Gastrointestinal: Denies loose stool, Denies emesis, melena, constipationor rectal bleeding. Genitourinary: No difficulty with voiding noted.  PHYSICAL EXAMINATION:  Today's Vitals  09/05/16 0800 09/05/16 0808  BP:  (!) 144/91  Pulse:  80  Resp:  18  Temp:  98 F (36.7 C)  TempSrc:  Oral  SpO2:  99%  Weight:  205 lb (93 kg)  Height:  '5\' 5"'  (1.651 m)  PainSc: 4      GENERAL: Well-developed, well-nourished, in no acute distress. Flat affect. Alert and cooperative.  FOCUSED LOWER EXTREMITY EXAM, ALL OTHER SYSTEMS DEFERRED: DERMATOLOGY: Skin warm and supple bilateral, Dressing clean and intact to Right foot NEUROVASCULAR: Unchanged from prior MUSCULOSKELETAL: + Charcot foot deformity on right    XRAY, RIGHT FOOT- Xrays, Right foot:Calcaneal spurs, Fracture at medial cuneiform with midtarsal collapse suggestive of charcot deformity, calcification of vessels, No bony  erosions at ulcer sites sub met 1 and 5th toe suggestive of osteomyelitis. No gas in soft tissues. Significant swelling. No other acute findings.   MRI, RIGHT FOOT-  IMPRESSION: 1. Severe Charcot arthropathy at the Lisfranc joint with disorganization, dislocation, osseous fragmentation and synovitis. 2. The accuracy for detection of osteomyelitis is limited by these factors. No specific signs of osteomyelitis identified. 3. Nondisplaced fracture through the base of the fifth proximal phalanx. 4. No evidence of soft tissue abscess or obvious open wound.  ASSESSMENTS:  1.Chronic Diabetic foot ulceration, Right  2. Diabetes with Neuropathy 3. Diabetic charcot deformity  PLAN OF CARE: Patient seen and evaluated 1. History and physical completed/reviewed 2. Patient NPO since midnight 3. Previous Imaging reviewed  4. Consent for surgery explained and obtained for wound debridement and placement of graft, right foot. Explained risk and benefits; all questions answered and no guarantees granted. Elaborated to patient that this may require a staged approached with the possibility of the need for future repeat debridement and grafts based on how the wound is healing.  5. Patient to undergo right foot wound debridement and placement of graft surgical procedure 6. Case discussed with patient and to meet with friend represented after the procedure 7. To resume all home meds post-procedure and to give prn pain meds, antibiotics, anti-nausea, and anti-constipation medications post op 8. Patient to be discharged home post-procedure. Will continue to follow closely/ see post-operative in the office within 1 week as scheduled om 09-13-16.   Landis Martins, DPM

## 2016-09-05 NOTE — Discharge Instructions (Signed)
[facility] Instructions on Paper Chart

## 2016-09-05 NOTE — H&P (Signed)
Anesthesia H&P Update: History and Physical Exam reviewed; patient is OK for planned anesthetic and procedure. ? ?

## 2016-09-05 NOTE — Anesthesia Procedure Notes (Signed)
Procedure Name: LMA Insertion Date/Time: 09/05/2016 10:22 AM Performed by: Eligha Bridegroom Pre-anesthesia Checklist: Patient identified, Emergency Drugs available, Suction available, Patient being monitored and Timeout performed Patient Re-evaluated:Patient Re-evaluated prior to inductionOxygen Delivery Method: Circle system utilized Preoxygenation: Pre-oxygenation with 100% oxygen Intubation Type: IV induction Ventilation: Mask ventilation without difficulty LMA: LMA inserted LMA Size: 4.0 Number of attempts: 1 Placement Confirmation: positive ETCO2 and breath sounds checked- equal and bilateral Tube secured with: Tape Dental Injury: Teeth and Oropharynx as per pre-operative assessment

## 2016-09-05 NOTE — Brief Op Note (Signed)
09/05/2016  11:27 AM  PATIENT:  [name]  50 y.o. female  PRE-OPERATIVE DIAGNOSIS:  diabetic ulcer, right foot   POST-OPERATIVE DIAGNOSIS:  diabetic ulcer, right foot  PROCEDURE:  Procedure(s): DEBRIDEMENT WOUND WITH GRAFT RIGHT FOOT (Right)  SURGEON:  Surgeon(s) and Role:    * Environmental consultant, DPM - Primary  PHYSICIAN ASSISTANT:   ASSISTANTS: none   ANESTHESIA:   General with local post op block 30cc 1% lidocaine and 0.5% marcaine plain   EBL:  Total I/O In: 800 [I.V.:800] Out: -   BLOOD ADMINISTERED: None  DRAINS: none   LOCAL MEDICATIONS USED:  As above   SPECIMEN:  None  DISPOSITION OF SPECIMEN:  N/A  COUNTS:  YES  TOURNIQUET:   Total Tourniquet Time Documented: Calf (Right) - 35 minutes Total: Calf (Right) - 35 minutes   DICTATION: .Note written in EPIC  PLAN OF CARE: Discharge to home after PACU  PATIENT DISPOSITION:  PACU - hemodynamically stable.   Delay start of Pharmacological VTE agent (>24hrs) due to surgical blood loss or risk of bleeding: No

## 2016-09-05 NOTE — Transfer of Care (Signed)
Immediate Anesthesia Transfer of Care Note  Patient: [name]  Procedure(s) Performed: Procedure(s): DEBRIDEMENT WOUND WITH GRAFT RIGHT FOOT (Right)  Patient Location: PACU  Anesthesia Type:General  Level of Consciousness: awake, alert  and oriented  Airway & Oxygen Therapy: Patient Spontanous Breathing and Patient connected to nasal cannula oxygen  Post-op Assessment: Report given to RN and Post -op Vital signs reviewed and stable  Post vital signs: Reviewed and stable  Last Vitals:  Vitals:   09/05/16 0808 09/05/16 1130  BP: (!) 144/91 125/76  Pulse: 80 78  Resp: 18 (!) 8  Temp: 36.7 C 36.4 C    Last Pain:  Vitals:   09/05/16 1130  TempSrc:   PainSc: 0-No pain      Patients Stated Pain Goal: 3 ([id number])  Complications: No apparent anesthesia complications

## 2016-09-06 ENCOUNTER — Encounter (HOSPITAL_COMMUNITY): Payer: Self-pay | Admitting: Sports Medicine

## 2016-09-06 ENCOUNTER — Telehealth: Payer: Self-pay | Admitting: Sports Medicine

## 2016-09-06 ENCOUNTER — Telehealth: Payer: Self-pay

## 2016-09-06 LAB — HEMOGLOBIN A1C
HEMOGLOBIN A1C: 9.7 % — AB (ref 4.8–5.6)
Mean Plasma Glucose: 232 mg/dL

## 2016-09-06 NOTE — Telephone Encounter (Signed)
Post op check phone call Called patient to make sure she was doing fine after surgery. Patient states that she has no symptoms however the ACE wrap is coming off. I advised patient to reapply ace wrap and to refrain from removing any other dressing layers. Patient has home nursing that will resume dressing changes as instructed. Patient to follow up as scheduled on 09-13-16 or sooner if problems or issues arise. -Dr. Cannon Kettle

## 2016-09-06 NOTE — Telephone Encounter (Signed)
Spoke with Renee at [facility] [phone], to give her new wound care orders. Per Dr Cannon Kettle, apply adaptic over graft cover with 4x4, kerlix, coban or ace wrap change 2-3 times per week.

## 2016-09-06 NOTE — Anesthesia Postprocedure Evaluation (Signed)
Anesthesia Post Note  Patient: [name]  Procedure(s) Performed: Procedure(s) (LRB): DEBRIDEMENT WOUND WITH GRAFT RIGHT FOOT (Right)  Patient location during evaluation: PACU Anesthesia Type: General Level of consciousness: awake and alert Pain management: pain level controlled Vital Signs Assessment: post-procedure vital signs reviewed and stable Respiratory status: spontaneous breathing, nonlabored ventilation, respiratory function stable and patient connected to nasal cannula oxygen Cardiovascular status: blood pressure returned to baseline and stable Postop Assessment: no signs of nausea or vomiting Anesthetic complications: no    Last Vitals:  Vitals:   09/05/16 1208 09/05/16 1225  BP:  (!) 144/83  Pulse: 73   Resp: 13   Temp: 36.1 C     Last Pain:  Vitals:   09/05/16 1225  TempSrc:   PainSc: 0-No pain                 Catalina Gravel

## 2016-09-12 NOTE — Progress Notes (Signed)
DOS 09.11.2017 Right foot wound debridement and placement of graft

## 2016-09-13 ENCOUNTER — Ambulatory Visit (INDEPENDENT_AMBULATORY_CARE_PROVIDER_SITE_OTHER): Payer: Medicare Other

## 2016-09-13 ENCOUNTER — Encounter: Payer: Self-pay | Admitting: Sports Medicine

## 2016-09-13 ENCOUNTER — Ambulatory Visit (INDEPENDENT_AMBULATORY_CARE_PROVIDER_SITE_OTHER): Payer: Medicare Other | Admitting: Sports Medicine

## 2016-09-13 VITALS — BP 134/83 | HR 87 | Temp 97.3°F | Resp 16 | Ht 65.0 in | Wt 198.0 lb

## 2016-09-13 DIAGNOSIS — E11621 Type 2 diabetes mellitus with foot ulcer: Secondary | ICD-10-CM | POA: Diagnosis not present

## 2016-09-13 DIAGNOSIS — L97519 Non-pressure chronic ulcer of other part of right foot with unspecified severity: Secondary | ICD-10-CM

## 2016-09-13 DIAGNOSIS — Z9889 Other specified postprocedural states: Secondary | ICD-10-CM

## 2016-09-13 DIAGNOSIS — L03119 Cellulitis of unspecified part of limb: Secondary | ICD-10-CM | POA: Diagnosis not present

## 2016-09-13 DIAGNOSIS — E11610 Type 2 diabetes mellitus with diabetic neuropathic arthropathy: Secondary | ICD-10-CM | POA: Diagnosis not present

## 2016-09-13 DIAGNOSIS — L02619 Cutaneous abscess of unspecified foot: Secondary | ICD-10-CM

## 2016-09-13 DIAGNOSIS — M79671 Pain in right foot: Secondary | ICD-10-CM | POA: Diagnosis not present

## 2016-09-13 MED ORDER — LEVOFLOXACIN 500 MG PO TABS: 500.0000 mg | ORAL_TABLET | Freq: Every day | ORAL | 0 refills | Status: AC

## 2016-09-13 MED ORDER — HYDROCODONE-ACETAMINOPHEN 10-325 MG PO TABS: 1.0000 | ORAL_TABLET | Freq: Four times a day (QID) | ORAL | 0 refills | Status: DC | PRN

## 2016-09-13 NOTE — Progress Notes (Signed)
Subjective: [name] is a 50 y.o. female patient seen today in office for POV #1 (DOS 09-05-16), S/P Right foot wound debridement with placement of graft. Patient states that she did not get her antibiotics. Patient denies current pain at surgical site, denies calf pain, denies headache, chest pain, shortness of breath, nausea, vomiting, fever, or chills. Patient has home nursing every other day. No other issues noted.   Patient Active Problem List   Diagnosis Date Noted  . Cancer of sigmoid colon ([place]) 03/09/2015  . OSA (obstructive sleep apnea) 07/04/2014  . Migraine without aura, with intractable migraine, so stated, without mention of status migrainosus 03/26/2014  . Peripheral neuropathy ([place]) 03/03/2014  . Abnormal stress test 02/12/2014  . CVA (cerebral infarction) 02/12/2014  . Chest pain   . Abnormal heart rhythm   . Edema   . Hypertension   . Hyperlipemia   . Hypercholesterolemia   . Other and unspecified hyperlipidemia 11/14/2013  . Type II or unspecified type diabetes mellitus without mention of complication, uncontrolled 07/22/2013  . Unspecified essential hypertension 07/22/2013  . 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 Diabetic polyneuropathy ([place]) 07/22/2013    Current Outpatient Prescriptions on File Prior to Visit  Medication Sig Dispense Refill  . amphetamine-dextroamphetamine (ADDERALL) 30 MG tablet Take 30 mg by mouth daily.   0  . atorvastatin (LIPITOR) 40 MG tablet Take 1 tablet (40 mg total) by mouth daily at 6 PM. 90 tablet 3  . BELSOMRA 20 MG TABS Take 1 tablet by mouth daily.    . canagliflozin (INVOKANA) 300 MG TABS tablet TAKE 300 MG BY MOUTH DAILY BEFORE BREAKFAST. (Patient taking differently: Take 300 mg by mouth daily before breakfast. TAKE 300 MG BY MOUTH DAILY BEFORE BREAKFAST.) 30 tablet 3  . CHANTIX 0.5 MG tablet Take 1 mg by mouth daily. Reported on 05/27/2016    . chlorproMAZINE  (THORAZINE) 200 MG tablet Take 400 mg by mouth at bedtime.     . diazepam (VALIUM) 10 MG tablet Take 20 mg by mouth at bedtime.     . docusate sodium (COLACE) 100 MG capsule Take 1 capsule (100 mg total) by mouth 2 (two) times daily. 10 capsule 0  . esomeprazole (NEXIUM) 40 MG capsule Take 40 mg by mouth daily.     . fenofibrate (TRICOR) 145 MG tablet TAKE 1 TABLET (145 MG TOTAL) BY MOUTH DAILY. 30 tablet 5  . glucose blood (FREESTYLE LITE) test strip Use as instructed to check blood sugar 3 times per day dx code E11.65 100 each 5  . insulin aspart (NOVOLOG) 100 UNIT/ML injection Use per v-go pump 76 units daily 30 mL 2  . Insulin Disposable Pump (V-GO 40) KIT Use 1 device per day. 1 kit 3  . Lancets (FREESTYLE) lancets Use as instructed to check blood sugar 3 times per day dx code E11.65 100 each 5  . levofloxacin (LEVAQUIN) 500 MG tablet Take 1 tablet (500 mg total) by mouth daily. 14 tablet 0  . promethazine (PHENERGAN) 12.5 MG tablet Take 1 tablet (12.5 mg total) by mouth every 6 (six) hours as needed for nausea or vomiting. 30 tablet 0  . rizatriptan (MAXALT) 10 MG tablet Take 1 tablet (10 mg total) by mouth as needed for migraine. May repeat in 2 hours if needed (Patient taking differently: Take 10 mg by mouth as needed for migraine. May repeat in 2 hours if needed) 10 tablet 3  . spironolactone (  ALDACTONE) 50 MG tablet Take 1 tablet (50 mg total) by mouth daily. 30 tablet 2  . tamsulosin (FLOMAX) 0.4 MG CAPS capsule Take 0.4 mg by mouth daily.    [name] tiZANidine (ZANAFLEX) 4 MG tablet Take 2 tablets by mouth at bedtime.    . TRULICITY 1.61 WR/6.0AV SOPN Inject 5 mLs as directed once a week. Thursdays    . Vortioxetine HBr (TRINTELLIX) 20 MG TABS Take 25 mg by mouth daily. Takes with a 5 mg tablet to equal 25 mg    . Vortioxetine HBr 5 MG TABS Take 25 mg by mouth daily. Takes with a 20 mg tablet to equal 25 mg     No current facility-administered medications on file prior to visit.      Allergies  Allergen Reactions  . Latex Itching, Rash and Other (See Comments)    Pt states she cannot use condoms - cause an infection.  Use of latex on skin is okay.  Tape causes rash  . Sweetening Enhancer [Flavoring Agent] Nausea And Vomiting and Other (See Comments)    HEADACHES  . Tetracyclines & Related Nausea And Vomiting and Other (See Comments)    ;YEAST INFECTIONS  . Aspartame And Phenylalanine Nausea And Vomiting    HEADACHES  . Ibuprofen Other (See Comments)    HEADACHES  . Trazodone And Nefazodone Other (See Comments)    Hallucinations   . Triazolam Other (See Comments)    HALLUCINATIONS    Objective: Vitals:   09/13/16 1046  Weight: 198 lb (89.8 kg)  Height: '5\' 5"'$  (1.651 m)    General: No acute distress, AAOx3  Right foot: Sutures intact at graft sites at medial midfoot and dorsal 5th toe with no gapping or dehiscence at surgical sites, there is mild lifting of the silicone layer of the graft as anticipated, mild swelling right foot, no streaking erythema/cellulitis, no warmth, no drainage, no gross signs of infection noted, Capillary fill time <3 seconds in all digits, gross sensation present via light touch to right foot. Protective sensation absent. Right foot deformity significant for charcot. No pain or crepitation with range of motion right foot.  No pain with calf compression.   Post Op Xray, Right foot: Unchanged midfoot collapse and charcot changes, + calcaneal enthesopathy, Soft tissues consistent with post op status.   Assessment and Plan:  Problem List Items Addressed This Visit    None    Visit Diagnoses    Diabetic ulcer of right foot associated with type 2 diabetes mellitus ([place])    -  Primary   Relevant Orders   DG Foot Complete Right   Diabetic Charcot's foot ([place])       Relevant Medications   HYDROcodone-acetaminophen (NORCO) 10-325 MG tablet   Other Relevant Orders   DG Foot Complete Right   Right foot pain       Relevant Medications    levofloxacin (LEVAQUIN) 500 MG tablet   HYDROcodone-acetaminophen (NORCO) 10-325 MG tablet   Other Relevant Orders   DG Foot Complete Right   Cellulitis and abscess of foot       Relevant Orders   DG Foot Complete Right   S/P foot surgery, right       Relevant Medications   levofloxacin (LEVAQUIN) 500 MG tablet      -Patient seen and evaluated -Applied hydrogel guaze to graft sites secured with dry sterile dressing to surgical site right foot secured with Coban and stockinet  -Advised patient to make sure to keep dressings  clean, dry, and intact; Nursing to continue with every dressing changes every other day reapplying nonadherent guaze to graft sites.  -Refilled norco -Reordered Levaquin since patient did not pick it up on day after surgery  -Advised patient to continue with post-op shoe on right foot   -Advised patient to limit activity to necessity  -Advised patient to elevate as necessary  -Return 1 week for graft site check. In the meantime, patient to call office if any issues or problems arise.   Landis Martins, DPM

## 2016-09-20 ENCOUNTER — Encounter: Payer: Self-pay | Admitting: Endocrinology

## 2016-09-20 ENCOUNTER — Ambulatory Visit (INDEPENDENT_AMBULATORY_CARE_PROVIDER_SITE_OTHER): Payer: Medicare Other | Admitting: Sports Medicine

## 2016-09-20 ENCOUNTER — Other Ambulatory Visit: Payer: Self-pay | Admitting: *Deleted

## 2016-09-20 ENCOUNTER — Encounter: Payer: Self-pay | Admitting: Sports Medicine

## 2016-09-20 ENCOUNTER — Ambulatory Visit (INDEPENDENT_AMBULATORY_CARE_PROVIDER_SITE_OTHER): Payer: Medicare Other | Admitting: Endocrinology

## 2016-09-20 VITALS — BP 125/75 | HR 91 | Temp 98.0°F | Resp 16 | Ht 65.0 in | Wt 196.8 lb

## 2016-09-20 DIAGNOSIS — E1169 Type 2 diabetes mellitus with other specified complication: Principal | ICD-10-CM

## 2016-09-20 DIAGNOSIS — E11610 Type 2 diabetes mellitus with diabetic neuropathic arthropathy: Secondary | ICD-10-CM

## 2016-09-20 DIAGNOSIS — M79671 Pain in right foot: Secondary | ICD-10-CM

## 2016-09-20 DIAGNOSIS — E1165 Type 2 diabetes mellitus with hyperglycemia: Secondary | ICD-10-CM

## 2016-09-20 DIAGNOSIS — L97519 Non-pressure chronic ulcer of other part of right foot with unspecified severity: Secondary | ICD-10-CM

## 2016-09-20 DIAGNOSIS — Z9889 Other specified postprocedural states: Secondary | ICD-10-CM

## 2016-09-20 DIAGNOSIS — E11621 Type 2 diabetes mellitus with foot ulcer: Secondary | ICD-10-CM

## 2016-09-20 DIAGNOSIS — Z23 Encounter for immunization: Secondary | ICD-10-CM

## 2016-09-20 DIAGNOSIS — IMO0002 Reserved for concepts with insufficient information to code with codable children: Secondary | ICD-10-CM

## 2016-09-20 DIAGNOSIS — E876 Hypokalemia: Secondary | ICD-10-CM

## 2016-09-20 LAB — BASIC METABOLIC PANEL
BUN: 8 mg/dL (ref 6–23)
CHLORIDE: 98 meq/L (ref 96–112)
CO2: 31 mEq/L (ref 19–32)
CREATININE: 0.75 mg/dL (ref 0.40–1.20)
Calcium: 9.1 mg/dL (ref 8.4–10.5)
GFR: 86.48 mL/min (ref >60.00)
Glucose, Bld: 233 mg/dL — ABNORMAL HIGH (ref 70–99)
Potassium: 3.6 mEq/L (ref 3.5–5.1)
Sodium: 138 mEq/L (ref 135–145)

## 2016-09-20 MED ORDER — SPIRONOLACTONE 100 MG PO TABS: 100.0000 mg | ORAL_TABLET | Freq: Every day | ORAL | 3 refills | Status: DC

## 2016-09-20 MED ORDER — HYDROCODONE-ACETAMINOPHEN 10-325 MG PO TABS: 1.0000 | ORAL_TABLET | Freq: Four times a day (QID) | ORAL | 0 refills | Status: DC | PRN

## 2016-09-20 NOTE — Progress Notes (Signed)
Patient ID: [name], female   DOB: 03/08/1966, 50 y.o.   MRN: [id number]   Reason for Appointment: Diabetes follow-up    History of Present Illness:   Diagnosis: Type 2 diabetes mellitus, date of diagnosis: 2004.  PAST history: She has been treated mostly with insulin since about a year after diagnosis. She has had difficulty with consistent compliance with diet and also compliance with self care including glucose monitoring over the years. She had been mostly treated with basal insulin. Also had been tried on mealtime insulin but she would be noncompliant with this. Was tried on Prandin for mealtime control but difficult to judge efficacy because of lack of postprandial monitoring. Was given Victoza to start in 2011 but did not follow up after this.She was tried on premixed insulin but this did not help her control, mostly because of noncompliance with the doses. She had been using the V- go pump and had better control initially and was better compliant with the daily routine and boluses. She has had frequent education visits also. She stopped using her V.-go pump in 6/15 because of discomfort at the site of application Also did not improve with a trial of Victoza   RECENT history:    INSULIN dose:  V-go 40 unit pump,  Mealtime bolus 4 clicks, 3-4 x daily Non-insulin hypoglycemic drugs: Invokana 300 mg in the morning, currently not on Trulicity 0.78 mg weekly  Her A1c previously has been consistently high and recently 9.7 although previously has been higher  She did start the V-go pump in early June because of her poor control especially postprandially  Current management and problems identified:  She has not checked her blood sugars much and is doing this fairly sporadically  Checking blood sugars mostly in the afternoons and evenings  Blood sugars were significantly high in the evenings about 3-4 weeks ago, highest 484  The last 2 readings have been relatively  better, lowest 147 at around 5 PM.  Although she thinks she is trying to take her boluses with every meal not clear why her sugars are consistently high  Also she thinks she is cutting back on juices, regular soft drinks and only drinking some Gatorade  She thinks she is compliant with Invokana  Fasting glucose in the lab was 162 about 2 weeks ago  She does not take extra insulin for hi sugars  Although she thinks she is changing her pump daily she probably is not doing this at a consistent time in the evening.  She thinks she still has some insulin left over when changing the pump  Glucometer:  FreeStyle.  Checking blood sugar  As above Blood Glucose readings:    RANGE 147-484 with average 328 Most readings between 4 PM-8 PM, not clear which readings are before or after eating  Hypoglycemia frequency: Never.    Food preferences: eating 1 or 2 meals per day, variable intake, sandwiches at times or otherwise snacks, dinner usually 8 PM  Physical activity: exercise: Minimal.  She has difficulties with leg pain Certified Diabetes Educator visit: Most recent:, 2/14.   The diet that the patient has been following is no specific diet; still eating at irregular times; still drinking drinks with sugar    Wt Readings from Last 3 Encounters:  09/20/16 196 lb 12.8 oz (89.3 kg)  09/13/16 198 lb (89.8 kg)  09/05/16 205 lb (93 kg)   DM labs:   Lab Results  Component Value Date   HGBA1C  9.7 (H) 09/05/2016   HGBA1C 10.2 (H) 05/25/2016   HGBA1C 12.3 (H) 02/29/2016   Lab Results  Component Value Date   MICROALBUR <0.7 08/12/2015   LDLCALC 41 01/01/2016   CREATININE 0.75 09/20/2016    Office Visit on 09/20/2016  Component Date Value Ref Range Status  . Sodium 09/20/2016 138  135 - 145 mEq/L Final  . Potassium 09/20/2016 3.6  3.5 - 5.1 mEq/L Final  . Chloride 09/20/2016 98  96 - 112 mEq/L Final  . CO2 09/20/2016 31  19 - 32 mEq/L Final  . Glucose, Bld 09/20/2016 233* 70 - 99 mg/dL  Final  . BUN 09/20/2016 8  6 - 23 mg/dL Final  . Creatinine, Ser 09/20/2016 0.75  0.40 - 1.20 mg/dL Final  . Calcium 09/20/2016 9.1  8.4 - 10.5 mg/dL Final  . GFR 09/20/2016 86.48  >60.00 mL/min Final          Medication List       Accurate as of 09/20/16 11:59 PM. Always use your most recent med list.          amphetamine-dextroamphetamine 30 MG tablet Commonly known as:  ADDERALL Take 30 mg by mouth daily.   atorvastatin 40 MG tablet Commonly known as:  LIPITOR Take 1 tablet (40 mg total) by mouth daily at 6 PM.   BELSOMRA 20 MG Tabs Generic drug:  Suvorexant Take 1 tablet by mouth daily.   canagliflozin 300 MG Tabs tablet Commonly known as:  INVOKANA TAKE 300 MG BY MOUTH DAILY BEFORE BREAKFAST.   CHANTIX 0.5 MG tablet Generic drug:  varenicline Take 1 mg by mouth daily. Reported on 05/27/2016   chlorproMAZINE 200 MG tablet Commonly known as:  THORAZINE Take 400 mg by mouth at bedtime.   diazepam 10 MG tablet Commonly known as:  VALIUM Take 20 mg by mouth at bedtime.   docusate sodium 100 MG capsule Commonly known as:  COLACE Take 1 capsule (100 mg total) by mouth 2 (two) times daily.   esomeprazole 40 MG capsule Commonly known as:  NEXIUM Take 40 mg by mouth daily.   fenofibrate 145 MG tablet Commonly known as:  TRICOR TAKE 1 TABLET (145 MG TOTAL) BY MOUTH DAILY.   freestyle lancets Use as instructed to check blood sugar 3 times per day dx code E11.65   glucose blood test strip Commonly known as:  FREESTYLE LITE Use as instructed to check blood sugar 3 times per day dx code E11.65   HYDROcodone-acetaminophen 10-325 MG tablet Commonly known as:  NORCO Take 1 tablet by mouth every 6 (six) hours as needed.   insulin aspart 100 UNIT/ML injection Commonly known as:  NOVOLOG Use per v-go pump 76 units daily   levofloxacin 500 MG tablet Commonly known as:  LEVAQUIN Take 1 tablet (500 mg total) by mouth daily.   promethazine 12.5 MG  tablet Commonly known as:  PHENERGAN Take 1 tablet (12.5 mg total) by mouth every 6 (six) hours as needed for nausea or vomiting.   rizatriptan 10 MG tablet Commonly known as:  MAXALT Take 1 tablet (10 mg total) by mouth as needed for migraine. May repeat in 2 hours if needed   spironolactone 100 MG tablet Commonly known as:  ALDACTONE Take 1 tablet (100 mg total) by mouth daily.   tamsulosin 0.4 MG Caps capsule Commonly known as:  FLOMAX Take 0.4 mg by mouth daily.   tiZANidine 4 MG tablet Commonly known as:  ZANAFLEX Take 2 tablets by mouth at  bedtime.   TRULICITY 5.40 JW/1.1BJ Sopn Generic drug:  Dulaglutide Inject 5 mLs as directed once a week. Thursdays   V-GO 40 Kit Use 1 device per day.   vortioxetine HBr 5 MG Tabs Commonly known as:  TRINTELLIX Take 25 mg by mouth daily. Takes with a 20 mg tablet to equal 25 mg   TRINTELLIX 20 MG Tabs Generic drug:  vortioxetine HBr Take 25 mg by mouth daily. Takes with a 5 mg tablet to equal 25 mg       Allergies:  Allergies  Allergen Reactions  . Latex Itching, Rash and Other (See Comments)    Pt states she cannot use condoms - cause an infection.  Use of latex on skin is okay.  Tape causes rash  . Sweetening Enhancer [Flavoring Agent] Nausea And Vomiting and Other (See Comments)    HEADACHES  . Tetracyclines & Related Nausea And Vomiting and Other (See Comments)    ;YEAST INFECTIONS  . Aspartame And Phenylalanine Nausea And Vomiting    HEADACHES  . Ibuprofen Other (See Comments)    HEADACHES  . Trazodone And Nefazodone Other (See Comments)    Hallucinations   . Triazolam Other (See Comments)    HALLUCINATIONS    Past Medical History:  Diagnosis Date  . Anginal pain ([place])   . Anxiety   . Arthritis    "qwhere" (02/12/2014)  . Bipolar disorder ([place])   . Cancer [facility])    colon cancer  . Chest pain    a. 2008 Cath: normal cors;  b. 12/2013 [facility] CL: EF 47%, no ischemia/infarct.  . Cholecystitis   . Chronic back  pain   . Chronic pain   . Claudication ([place])    a. 12/2013 ABI's: R 0.97, L 0.94.  [name] COPD (chronic obstructive pulmonary disease) ([place])   . Depression   . Diabetic foot ulcer ([facility])    chronic  . DJD (degenerative joint disease)   . Edema   . Gastroparesis   . GERD (gastroesophageal reflux disease)   . H/O hiatal hernia   . YNWGNFAO(130.8)    "weekly" (02/12/2014)  . History of blood transfusion 1986   "related to lost a child" (02/12/2014)  . Hypercholesterolemia   . Hyperlipemia   . Hypersomnia   . Hypertension   . Insomnia   . MRSA (methicillin resistant Staphylococcus aureus)   . Neurogenic bladder   . OSA (obstructive sleep apnea) 07/04/2014  . Palpitations   . Personality disorder   . Pneumonia    "twice, I think" (02/12/2014)  . Sleep apnea    didn't tolerate cpap (02/12/2014)  . SOB (shortness of breath) on exertion   . Stroke ([place]) 02/12/2014   "eyesight is messed up; not steady on my feet" (02/12/2014)  . SUI (stress urinary incontinence, female) S/P SLING 12-29-2011  . Tobacco abuse   . Type II diabetes mellitus ([place])   . Ulcer   . Vertigo     Past Surgical History:  Procedure Laterality Date  . ABDOMINAL HYSTERECTOMY    . ANTERIOR CERVICAL DECOMP/DISCECTOMY FUSION  2000   C6 - 7  . CARDIAC CATHETERIZATION  05-22-2008   DR SKAINS   NO SIGNIFECANT CAD/ NORMAL LV/  EF 65%/  NO WALL MOTION ABNORMALITIES  . CARDIAC CATHETERIZATION  02/12/2014  . CARPAL TUNNEL RELEASE Right 04-25-2013  . [place]; 1992  . CYSTO N/A 04/30/2013   Procedure: CYSTOSCOPY;  Surgeon: Reece Packer, MD;  Location: [facility];  Service: Urology;  Laterality: N/A;  . CYSTOSCOPY WITH INJECTION  05/04/2012   Procedure: CYSTOSCOPY WITH INJECTION;  Surgeon: Reece Packer, MD;  Location: [place];  Service: Urology;  Laterality: N/A;  MACROPLASTIQUE INJECTION  . CYSTOSCOPY WITH INJECTION  08/28/2012   Procedure: CYSTOSCOPY WITH INJECTION;  Surgeon: Reece Packer, MD;  Location: [facility];  Service: Urology;  Laterality: N/A;  cysto and macroplastique   . FOOT SURGERY Bilateral   . HERNIA REPAIR  ?1996   "stomach"  . KNEE ARTHROSCOPY W/ ALLOGRAFT IMPANT Left    graft x 2  . KNEE SURGERY     TOTAL 8 SURG'S  . LAPAROSCOPIC SIGMOID COLECTOMY  04/22/2015  . LAPAROSCOPIC SIGMOID COLECTOMY N/A 04/22/2015   Procedure: LAPAROSCOPIC HAND ASSISTED SIGMOID COLECTOMY;  Surgeon: Erroll Luna, MD;  Location: [place];  Service: General;  Laterality: N/A;  . LEFT HEART CATHETERIZATION WITH CORONARY ANGIOGRAM N/A 02/12/2014   Procedure: LEFT HEART CATHETERIZATION WITH CORONARY ANGIOGRAM;  Surgeon: Candee Furbish, MD;  Location: [facility] C[facility];  Service: Cardiovascular;  Laterality: N/A;  . LUMBAR FUSION     "cage in my spine"  . MULTIPLE LAPAROSCOPIES FOR ENDOMETRIOSIS    . PUBOVAGINAL SLING  12/29/2011   Procedure: Gaynelle Arabian;  Surgeon: Reece Packer, MD;  Location: [facility];  Service: Urology;  Laterality: N/A;  cysto and sparc sling   . PUBOVAGINAL SLING N/A 04/30/2013   Procedure: REMOVAL OF VAGINAL MESH;  Surgeon: Reece Packer, MD;  Location: [facility];  Service: Urology;  Laterality: N/A;  . RECONSTURCTION OF CONGENITAL UTERUS ANOMALY  1983  . REPEAT RECONSTRUCTION ACL LEFT KNEE/ SCREWS REMOVED  03-28-2000   CADAVER GRAFT  . TOTAL ABDOMINAL HYSTERECTOMY W/ BILATERAL SALPINGOOPHORECTOMY  1997  . WOUND DEBRIDEMENT Right 09/05/2016   Procedure: DEBRIDEMENT WOUND WITH GRAFT RIGHT FOOT;  Surgeon: Landis Martins, DPM;  Location: [place];  Service: Podiatry;  Laterality: Right;    Family History  Problem Relation Age of Onset  . Hypertension Mother   . Diabetes Mother   . Cancer - Other Mother     lymphoma   . Cancer - Other Father     lung, bladder cancer   . Heart attack Father   . Cancer - Other Brother     bladder cancer     Social History:  reports that she has been smoking Cigarettes and  E-cigarettes.  She has a 10.25 pack-year smoking history. She has never used smokeless tobacco. She reports that she does not drink alcohol or use drugs.  Review of Systems -   She is being treated by the podiatrist for persistent foot ulcer Also has had Charcot foot fracture  She has history of high triglycerides treated with fenofibrate   Currently she is taking Lipitor 40 mg for high LDL  Lab Results  Component Value Date   CHOL 95 05/25/2016   HDL 28.30 (L) 05/25/2016   LDLCALC 41 01/01/2016   LDLDIRECT 40.0 05/25/2016   TRIG 307.0 (H) 05/25/2016   CHOLHDL 3 05/25/2016    Hypertension: This has been relatively mild and controlled . She is Recently taking spironolactone 50 mg daily  HYPOKALEMIA: She is not taking Lasix Her last potassium was 3.0 she does not like to take any potassium supplements   Lab Results  Component Value Date   CREATININE 0.75 09/20/2016   BUN 8 09/20/2016   NA 138 09/20/2016   K 3.6 09/20/2016   CL 98 09/20/2016  CO2 31 09/20/2016     Painful neuropathy: She has had persistent symptoms including leg pains and numbness as well as difficulty with balance and has tried various drugs including gabapentin without much relief  Foot exam Last showed absent pedal pulses and distal sensation    Examination:   BP 125/75 (BP Location: Right Arm, Cuff Size: Normal)   Pulse 91   Temp 98 F (36.7 C)   Resp 16   Ht _0  (1.651 m)   Wt 196 lb 12.8 oz (89.3 kg)   SpO2 95%   BMI 32.75 kg/m   Body mass index is 32.75 kg/m.   Mild  facial puffiness present   Assesment/Plan:   1. Diabetes type 2, with persistently poor control, BMI 35 See history of present illness for detailed discussion of current diabetes management, blood sugar patterns and problems identified  She has had poor control this year With her difficulty with compliance with her insulin injections she is starting to do better with we-go pump However A1c is still over 9 Blood  sugars still go up to over 400 and not clear whether this is difficulty with compliance with diet, sweet drinks or managing her pump as directed Since fasting reading was 162 in the hospital it is likely that most of her high readings at after eating or drinking    Recommendations:  Regular glucose monitoring as discussed at various times  Increase mealtime boluses by at least 2 units  She will check blood sugars with every meal and take extra 1-to clicks for high readings  Keep cutting back on drinks with sugar  2. Hypertension: Blood pressure is normal rate Aldactone Because of her recent low potassium will increase her Aldactone to 100 mg  No recent edema   3.  She will have potassium rechecked today   Patient Instructions  Take 5 clicks with pump for main meals  Change pump every 24 hrs  Aldactone 2 daily  Check sugars DAILY    Counseling time on subjects discussed above is over 50% of today's 25 minute visit   Edwinna Rochette 09/21/2016, 1:27 PM

## 2016-09-20 NOTE — Patient Instructions (Addendum)
Take 5 clicks with pump for main meals  Change pump every 24 hrs  Aldactone 2 daily  Check sugars DAILY

## 2016-09-20 NOTE — Progress Notes (Signed)
Subjective: [name] is a 50 y.o. female patient seen today in office for POV #2 (DOS 09-05-16), S/P Right foot wound debridement with placement of graft. Patient states that she is now taking her antibiotics. Patient denies current pain at surgical site, denies calf pain, denies headache, chest pain, shortness of breath, nausea, vomiting, fever, or chills. Patient has home nursing every other day. No other issues noted.   Patient Active Problem List   Diagnosis Date Noted  . Cancer of sigmoid colon ([place]) 03/09/2015  . OSA (obstructive sleep apnea) 07/04/2014  . Migraine without aura, with intractable migraine, so stated, without mention of status migrainosus 03/26/2014  . Peripheral neuropathy ([place]) 03/03/2014  . Abnormal stress test 02/12/2014  . CVA (cerebral infarction) 02/12/2014  . Chest pain   . Abnormal heart rhythm   . Edema   . Hypertension   . Hyperlipemia   . Hypercholesterolemia   . Other and unspecified hyperlipidemia 11/14/2013  . Type II diabetes mellitus, uncontrolled ([place]) 07/22/2013  . Unspecified essential hypertension 07/22/2013  . 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 Diabetic polyneuropathy ([place]) 07/22/2013    Current Outpatient Prescriptions on File Prior to Visit  Medication Sig Dispense Refill  . amphetamine-dextroamphetamine (ADDERALL) 30 MG tablet Take 30 mg by mouth daily.   0  . atorvastatin (LIPITOR) 40 MG tablet Take 1 tablet (40 mg total) by mouth daily at 6 PM. 90 tablet 3  . BELSOMRA 20 MG TABS Take 1 tablet by mouth daily.    . canagliflozin (INVOKANA) 300 MG TABS tablet TAKE 300 MG BY MOUTH DAILY BEFORE BREAKFAST. (Patient taking differently: Take 300 mg by mouth daily before breakfast. TAKE 300 MG BY MOUTH DAILY BEFORE BREAKFAST.) 30 tablet 3  . CHANTIX 0.5 MG tablet Take 1 mg by mouth daily. Reported on 05/27/2016    . chlorproMAZINE (THORAZINE) 200 MG tablet Take 400 mg by  mouth at bedtime.     . diazepam (VALIUM) 10 MG tablet Take 20 mg by mouth at bedtime.     . docusate sodium (COLACE) 100 MG capsule Take 1 capsule (100 mg total) by mouth 2 (two) times daily. 10 capsule 0  . esomeprazole (NEXIUM) 40 MG capsule Take 40 mg by mouth daily.     . fenofibrate (TRICOR) 145 MG tablet TAKE 1 TABLET (145 MG TOTAL) BY MOUTH DAILY. 30 tablet 5  . glucose blood (FREESTYLE LITE) test strip Use as instructed to check blood sugar 3 times per day dx code E11.65 100 each 5  . insulin aspart (NOVOLOG) 100 UNIT/ML injection Use per v-go pump 76 units daily 30 mL 2  . Insulin Disposable Pump (V-GO 40) KIT Use 1 device per day. 1 kit 3  . Lancets (FREESTYLE) lancets Use as instructed to check blood sugar 3 times per day dx code E11.65 100 each 5  . levofloxacin (LEVAQUIN) 500 MG tablet Take 1 tablet (500 mg total) by mouth daily. 10 tablet 0  . promethazine (PHENERGAN) 12.5 MG tablet Take 1 tablet (12.5 mg total) by mouth every 6 (six) hours as needed for nausea or vomiting. 30 tablet 0  . rizatriptan (MAXALT) 10 MG tablet Take 1 tablet (10 mg total) by mouth as needed for migraine. May repeat in 2 hours if needed (Patient taking differently: Take 10 mg by mouth as needed for migraine. May repeat in 2 hours if needed) 10 tablet 3  . spironolactone (ALDACTONE) 100 MG tablet Take 1  tablet (100 mg total) by mouth daily. 30 tablet 3  . tamsulosin (FLOMAX) 0.4 MG CAPS capsule Take 0.4 mg by mouth daily.    [name] tiZANidine (ZANAFLEX) 4 MG tablet Take 2 tablets by mouth at bedtime.    . TRULICITY 7.61 PJ/0.9TO SOPN Inject 5 mLs as directed once a week. Thursdays    . Vortioxetine HBr (TRINTELLIX) 20 MG TABS Take 25 mg by mouth daily. Takes with a 5 mg tablet to equal 25 mg    . Vortioxetine HBr 5 MG TABS Take 25 mg by mouth daily. Takes with a 20 mg tablet to equal 25 mg     No current facility-administered medications on file prior to visit.     Allergies  Allergen Reactions  . Latex  Itching, Rash and Other (See Comments)    Pt states she cannot use condoms - cause an infection.  Use of latex on skin is okay.  Tape causes rash  . Sweetening Enhancer [Flavoring Agent] Nausea And Vomiting and Other (See Comments)    HEADACHES  . Tetracyclines & Related Nausea And Vomiting and Other (See Comments)    ;YEAST INFECTIONS  . Aspartame And Phenylalanine Nausea And Vomiting    HEADACHES  . Ibuprofen Other (See Comments)    HEADACHES  . Trazodone And Nefazodone Other (See Comments)    Hallucinations   . Triazolam Other (See Comments)    HALLUCINATIONS    Objective: There were no vitals filed for this visit.  General: No acute distress, AAOx3  Right foot: Sutures intact at graft sites at medial midfoot and dorsal 5th toe with no gapping or dehiscence at surgical sites, there is mild lifting of the silicone layer of the graft as anticipated, mild swelling right foot, no streaking erythema/cellulitis, no warmth, no drainage, no gross signs of infection noted, Capillary fill time <3 seconds in all digits, gross sensation present via light touch to right foot. Protective sensation absent. Right foot deformity significant for charcot. No pain or crepitation with range of motion right foot.  No pain with calf compression.   Assessment and Plan:  Problem List Items Addressed This Visit    None    Visit Diagnoses    Diabetic ulcer of right foot associated with type 2 diabetes mellitus ([place])    -  Primary   Diabetic Charcot's foot ([place])       Relevant Medications   HYDROcodone-acetaminophen (NORCO) 10-325 MG tablet   Right foot pain       Relevant Medications   HYDROcodone-acetaminophen (NORCO) 10-325 MG tablet   S/P foot surgery, right          -Patient seen and evaluated -Applied adaptic guaze to graft sites secured with dry sterile dressing to surgical site right foot secured with Cob[place] and stockinet  -Advised patient to make sure to keep dressings clean, dry, and intact;  Nursing to continue with every dressing changes every other day reapplying nonadherent guaze to graft sites.  -Refilled norco -Continue Levaquin  -Advised patient to continue with post-op shoe on right foot; felt padding added -Advised patient to limit activity to necessity  -Advised patient to elevate as necessary  -Return 1 week for graft site check. In the meantime, patient to call office if any issues or problems arise. May consider Oasis once silicone layer lifts.  Landis Martins, DPM

## 2016-09-22 NOTE — Progress Notes (Signed)
Please let patient know that the potassium result is normal

## 2016-09-27 ENCOUNTER — Ambulatory Visit (INDEPENDENT_AMBULATORY_CARE_PROVIDER_SITE_OTHER): Payer: Medicare Other | Admitting: Sports Medicine

## 2016-09-27 ENCOUNTER — Encounter: Payer: Self-pay | Admitting: Sports Medicine

## 2016-09-27 DIAGNOSIS — L97511 Non-pressure chronic ulcer of other part of right foot limited to breakdown of skin: Secondary | ICD-10-CM

## 2016-09-27 DIAGNOSIS — Z9889 Other specified postprocedural states: Secondary | ICD-10-CM

## 2016-09-27 DIAGNOSIS — E11621 Type 2 diabetes mellitus with foot ulcer: Secondary | ICD-10-CM | POA: Diagnosis not present

## 2016-09-27 DIAGNOSIS — E11610 Type 2 diabetes mellitus with diabetic neuropathic arthropathy: Secondary | ICD-10-CM | POA: Diagnosis not present

## 2016-09-27 DIAGNOSIS — M79671 Pain in right foot: Secondary | ICD-10-CM

## 2016-09-27 DIAGNOSIS — L97411 Non-pressure chronic ulcer of right heel and midfoot limited to breakdown of skin: Secondary | ICD-10-CM

## 2016-09-27 NOTE — Progress Notes (Signed)
Subjective: [name] is a 50 y.o. female patient seen today in office for POV #3 (DOS 09-05-16), S/P Right foot wound debridement with placement of graft. Patient states that she has 2 days of antibiotics left. Patient denies current pain at surgical site, denies calf pain, denies headache, chest pain, shortness of breath, nausea, vomiting, fever, or chills. Patient has home nursing every other day. No other issues noted.   Patient Active Problem List   Diagnosis Date Noted  . Cancer of sigmoid colon ([place]) 03/09/2015  . OSA (obstructive sleep apnea) 07/04/2014  . Migraine without aura, with intractable migraine, so stated, without mention of status migrainosus 03/26/2014  . Peripheral neuropathy ([place]) 03/03/2014  . Abnormal stress test 02/12/2014  . CVA (cerebral infarction) 02/12/2014  . Chest pain   . Abnormal heart rhythm   . Edema   . Hypertension   . Hyperlipemia   . Hypercholesterolemia   . Other and unspecified hyperlipidemia 11/14/2013  . Type II diabetes mellitus, uncontrolled ([place]) 07/22/2013  . Unspecified essential hypertension 07/22/2013  . 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 Diabetic polyneuropathy ([place]) 07/22/2013    Current Outpatient Prescriptions on File Prior to Visit  Medication Sig Dispense Refill  . amphetamine-dextroamphetamine (ADDERALL) 30 MG tablet Take 30 mg by mouth daily.   0  . atorvastatin (LIPITOR) 40 MG tablet Take 1 tablet (40 mg total) by mouth daily at 6 PM. 90 tablet 3  . BELSOMRA 20 MG TABS Take 1 tablet by mouth daily.    . canagliflozin (INVOKANA) 300 MG TABS tablet TAKE 300 MG BY MOUTH DAILY BEFORE BREAKFAST. (Patient taking differently: Take 300 mg by mouth daily before breakfast. TAKE 300 MG BY MOUTH DAILY BEFORE BREAKFAST.) 30 tablet 3  . CHANTIX 0.5 MG tablet Take 1 mg by mouth daily. Reported on 05/27/2016    . chlorproMAZINE (THORAZINE) 200 MG tablet Take 400 mg by  mouth at bedtime.     . diazepam (VALIUM) 10 MG tablet Take 20 mg by mouth at bedtime.     . docusate sodium (COLACE) 100 MG capsule Take 1 capsule (100 mg total) by mouth 2 (two) times daily. 10 capsule 0  . esomeprazole (NEXIUM) 40 MG capsule Take 40 mg by mouth daily.     . fenofibrate (TRICOR) 145 MG tablet TAKE 1 TABLET (145 MG TOTAL) BY MOUTH DAILY. 30 tablet 5  . glucose blood (FREESTYLE LITE) test strip Use as instructed to check blood sugar 3 times per day dx code E11.65 100 each 5  . HYDROcodone-acetaminophen (NORCO) 10-325 MG tablet Take 1 tablet by mouth every 6 (six) hours as needed. 30 tablet 0  . insulin aspart (NOVOLOG) 100 UNIT/ML injection Use per v-go pump 76 units daily 30 mL 2  . Insulin Disposable Pump (V-GO 40) KIT Use 1 device per day. 1 kit 3  . Lancets (FREESTYLE) lancets Use as instructed to check blood sugar 3 times per day dx code E11.65 100 each 5  . promethazine (PHENERGAN) 12.5 MG tablet Take 1 tablet (12.5 mg total) by mouth every 6 (six) hours as needed for nausea or vomiting. 30 tablet 0  . rizatriptan (MAXALT) 10 MG tablet Take 1 tablet (10 mg total) by mouth as needed for migraine. May repeat in 2 hours if needed (Patient taking differently: Take 10 mg by mouth as needed for migraine. May repeat in 2 hours if needed) 10 tablet 3  . spironolactone (ALDACTONE) 100 MG  tablet Take 1 tablet (100 mg total) by mouth daily. 30 tablet 3  . tamsulosin (FLOMAX) 0.4 MG CAPS capsule Take 0.4 mg by mouth daily.    [name] tiZANidine (ZANAFLEX) 4 MG tablet Take 2 tablets by mouth at bedtime.    . TRULICITY 0.30 SP/2.3RA SOPN Inject 5 mLs as directed once a week. Thursdays    . Vortioxetine HBr (TRINTELLIX) 20 MG TABS Take 25 mg by mouth daily. Takes with a 5 mg tablet to equal 25 mg    . Vortioxetine HBr 5 MG TABS Take 25 mg by mouth daily. Takes with a 20 mg tablet to equal 25 mg     No current facility-administered medications on file prior to visit.     Allergies  Allergen  Reactions  . Latex Itching, Rash and Other (See Comments)    Pt states she cannot use condoms - cause an infection.  Use of latex on skin is okay.  Tape causes rash  . Sweetening Enhancer [Flavoring Agent] Nausea And Vomiting and Other (See Comments)    HEADACHES  . Tetracyclines & Related Nausea And Vomiting and Other (See Comments)    ;YEAST INFECTIONS  . Aspartame And Phenylalanine Nausea And Vomiting    HEADACHES  . Ibuprofen Other (See Comments)    HEADACHES  . Trazodone And Nefazodone Other (See Comments)    Hallucinations   . Triazolam Other (See Comments)    HALLUCINATIONS    Objective: There were no vitals filed for this visit.  General: No acute distress, AAOx3  Right foot: Sutures intact at graft sites at medial midfoot and dorsal 5th toe with no gapping or dehiscence at surgical sites, there is moderate lifting of the silicone layer of the graft as anticipated once silicone layer was removed there is an 4x3cm ulceration with granular base at medial midfoot and 1x0.5cm ulceration with granular base at dorsal 5th toe with no surround infection, mild swelling right foot, no streaking erythema/cellulitis, no warmth, no drainage, no gross signs of infection noted, Capillary fill time <3 seconds in all digits, gross sensation present via light touch to right foot. Protective sensation absent. Right foot deformity significant for charcot. No pain or crepitation with range of motion right foot.  No pain with calf compression.   Assessment and Plan:  Problem List Items Addressed This Visit    None    Visit Diagnoses    Diabetic ulcer of right midfoot associated with type 2 diabetes mellitus, limited to breakdown of skin ([place])    -  Primary   Toe ulcer, right, limited to breakdown of skin ([place])       Diabetic Charcot's foot ([place])       Right foot pain       S/P foot surgery, right         -Patient seen and evaluated -Silicone graft layer removed and nonselective debrided  ulceration sites using saline moistened guaze -[employer]) matrix 3x7cm (lot [id number]) divided amongst 2 ulceration sites on right foot with no waste secured with steri-strips covered with adaptic guaze to graft sites secured with dry sterile dressing secured with Coban and stockinet  -Advised patient to make sure to keep dressings clean, dry, and intact; Nursing to continue with every dressing changes every other day reapplying adaptic to graft sites. Refrain from use of tefla -Continue with norco as needed for pain -Continue Levaquin until completed -Advised patient to continue with post-op shoe on right foot with felt padding added -Advised patient  to limit activity to necessity  -Advised patient to elevate as necessary  -Return 1 week for graft site check. In the meantime, patient to call office if any issues or problems arise.  Landis Martins, DPM

## 2016-09-28 ENCOUNTER — Telehealth: Payer: Self-pay | Admitting: *Deleted

## 2016-09-28 NOTE — Telephone Encounter (Addendum)
-----   Message from Landis Martins, [place] sent at 09/27/2016  9:13 PM EDT ----- Regarding: Home nursing  Wound care: Dressing changes  Please do not use tefla. Use adaptic to ulceration graft sites (do not remove steristrips this is helping to keep the graft in place), 4x4, abd, kerlix and coban dressing.  -Dr Cannon Kettle. 09/28/2016-Copy of above orders faxed to [facility] [phone]. 10/04/2016- Faxed continuation of orders to [facility] [phone]. Unable to fax due to technical difficulties. [name] - [facility] took the copy of the orders to [facility].

## 2016-10-04 ENCOUNTER — Other Ambulatory Visit: Payer: Self-pay | Admitting: *Deleted

## 2016-10-04 ENCOUNTER — Encounter: Payer: Self-pay | Admitting: Sports Medicine

## 2016-10-04 ENCOUNTER — Ambulatory Visit (INDEPENDENT_AMBULATORY_CARE_PROVIDER_SITE_OTHER): Payer: Medicare Other | Admitting: Sports Medicine

## 2016-10-04 DIAGNOSIS — M79671 Pain in right foot: Secondary | ICD-10-CM

## 2016-10-04 DIAGNOSIS — E11621 Type 2 diabetes mellitus with foot ulcer: Secondary | ICD-10-CM

## 2016-10-04 DIAGNOSIS — L97511 Non-pressure chronic ulcer of other part of right foot limited to breakdown of skin: Secondary | ICD-10-CM

## 2016-10-04 DIAGNOSIS — L97521 Non-pressure chronic ulcer of other part of left foot limited to breakdown of skin: Secondary | ICD-10-CM | POA: Diagnosis not present

## 2016-10-04 DIAGNOSIS — L97411 Non-pressure chronic ulcer of right heel and midfoot limited to breakdown of skin: Secondary | ICD-10-CM | POA: Diagnosis not present

## 2016-10-04 DIAGNOSIS — M79672 Pain in left foot: Secondary | ICD-10-CM

## 2016-10-04 DIAGNOSIS — E11610 Type 2 diabetes mellitus with diabetic neuropathic arthropathy: Secondary | ICD-10-CM

## 2016-10-04 MED ORDER — POTASSIUM CHLORIDE CRYS ER 20 MEQ PO TBCR: 20.0000 meq | EXTENDED_RELEASE_TABLET | Freq: Every day | ORAL | 3 refills | Status: DC

## 2016-10-04 NOTE — Telephone Encounter (Signed)
-----   Message from Landis Martins, [place] sent at 10/04/2016  2:37 PM EDT ----- Regarding: Wound care orders  Nursing to continue with every dressing changes every other day reapplying adaptic to graft sites on right and new ulceration site on left. Refrain from use of tefla  Dr Cannon Kettle

## 2016-10-04 NOTE — Progress Notes (Signed)
Subjective: [name] is a 50 y.o. female patient seen today in office for POV #4 (DOS 09-05-16), S/P Right foot wound debridement with placement of graft. Patient has finished antibiotics. Patient denies current pain at surgical site, denies calf pain, denies headache, chest pain, shortness of breath, nausea, vomiting, fever, or chills. Patient has home nursing every other day. Reports she has a new sore on the left foot does not know how it happened. No other issues noted.   Patient Active Problem List   Diagnosis Date Noted  . Cancer of sigmoid colon ([place]) 03/09/2015  . OSA (obstructive sleep apnea) 07/04/2014  . Migraine without aura, with intractable migraine, so stated, without mention of status migrainosus 03/26/2014  . Peripheral neuropathy ([place]) 03/03/2014  . Abnormal stress test 02/12/2014  . CVA (cerebral infarction) 02/12/2014  . Chest pain   . Abnormal heart rhythm   . Edema   . Hypertension   . Hyperlipemia   . Hypercholesterolemia   . Other and unspecified hyperlipidemia 11/14/2013  . Type II diabetes mellitus, uncontrolled ([place]) 07/22/2013  . Unspecified essential hypertension 07/22/2013  . 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 Diabetic polyneuropathy ([place]) 07/22/2013    Current Outpatient Prescriptions on File Prior to Visit  Medication Sig Dispense Refill  . amphetamine-dextroamphetamine (ADDERALL) 30 MG tablet Take 30 mg by mouth daily.   0  . atorvastatin (LIPITOR) 40 MG tablet Take 1 tablet (40 mg total) by mouth daily at 6 PM. 90 tablet 3  . BELSOMRA 20 MG TABS Take 1 tablet by mouth daily.    . canagliflozin (INVOKANA) 300 MG TABS tablet TAKE 300 MG BY MOUTH DAILY BEFORE BREAKFAST. (Patient taking differently: Take 300 mg by mouth daily before breakfast. TAKE 300 MG BY MOUTH DAILY BEFORE BREAKFAST.) 30 tablet 3  . CHANTIX 0.5 MG tablet Take 1 mg by mouth daily. Reported on 05/27/2016    .  chlorproMAZINE (THORAZINE) 200 MG tablet Take 400 mg by mouth at bedtime.     . diazepam (VALIUM) 10 MG tablet Take 20 mg by mouth at bedtime.     . docusate sodium (COLACE) 100 MG capsule Take 1 capsule (100 mg total) by mouth 2 (two) times daily. 10 capsule 0  . esomeprazole (NEXIUM) 40 MG capsule Take 40 mg by mouth daily.     . fenofibrate (TRICOR) 145 MG tablet TAKE 1 TABLET (145 MG TOTAL) BY MOUTH DAILY. 30 tablet 5  . glucose blood (FREESTYLE LITE) test strip Use as instructed to check blood sugar 3 times per day dx code E11.65 100 each 5  . HYDROcodone-acetaminophen (NORCO) 10-325 MG tablet Take 1 tablet by mouth every 6 (six) hours as needed. 30 tablet 0  . insulin aspart (NOVOLOG) 100 UNIT/ML injection Use per v-go pump 76 units daily 30 mL 2  . Insulin Disposable Pump (V-GO 40) KIT Use 1 device per day. 1 kit 3  . Lancets (FREESTYLE) lancets Use as instructed to check blood sugar 3 times per day dx code E11.65 100 each 5  . promethazine (PHENERGAN) 12.5 MG tablet Take 1 tablet (12.5 mg total) by mouth every 6 (six) hours as needed for nausea or vomiting. 30 tablet 0  . rizatriptan (MAXALT) 10 MG tablet Take 1 tablet (10 mg total) by mouth as needed for migraine. May repeat in 2 hours if needed (Patient taking differently: Take 10 mg by mouth as needed for migraine. May repeat in 2 hours if  needed) 10 tablet 3  . spironolactone (ALDACTONE) 100 MG tablet Take 1 tablet (100 mg total) by mouth daily. 30 tablet 3  . tamsulosin (FLOMAX) 0.4 MG CAPS capsule Take 0.4 mg by mouth daily.    . tiZANidine (ZANAFLEX) 4 MG tablet Take 2 tablets by mouth at bedtime.    . TRULICITY 0.75 MG/0.5ML SOPN Inject 5 mLs as directed once a week. Thursdays    . Vortioxetine HBr (TRINTELLIX) 20 MG TABS Take 25 mg by mouth daily. Takes with a 5 mg tablet to equal 25 mg    . Vortioxetine HBr 5 MG TABS Take 25 mg by mouth daily. Takes with a 20 mg tablet to equal 25 mg     No current facility-administered  medications on file prior to visit.     Allergies  Allergen Reactions  . Latex Itching, Rash and Other (See Comments)    Pt states she cannot use condoms - cause an infection.  Use of latex on skin is okay.  Tape causes rash  . Sweetening Enhancer [Flavoring Agent] Nausea And Vomiting and Other (See Comments)    HEADACHES  . Tetracyclines & Related Nausea And Vomiting and Other (See Comments)    ;YEAST INFECTIONS  . Aspartame And Phenylalanine Nausea And Vomiting    HEADACHES  . Ibuprofen Other (See Comments)    HEADACHES  . Trazodone And Nefazodone Other (See Comments)    Hallucinations   . Triazolam Other (See Comments)    HALLUCINATIONS    Objective: There were no vitals filed for this visit.  General: No acute distress, AAOx3  Right foot: Sutures intact at graft sites at medial midfoot and dorsal 5th toe with no gapping or dehiscence at surgical sites, there is moderate lifting of the silicone layer of the graft as anticipated once silicone layer was removed there is an 3x1cm (smaller than previous) ulceration with granular base at medial midfoot and healed ulcerations at dorsal 5th toe with no surround infection, mild swelling right foot, no streaking erythema/cellulitis, no warmth, no drainage, no gross signs of infection noted, Capillary fill time <3 seconds in all digits, gross sensation present via light touch to right foot. Protective sensation absent. Right foot deformity significant for charcot. No pain or crepitation with range of motion right foot.  No pain with calf compression.   To left foot there is a partial thickness ulceration that measures 1x1cm likely skin tear even though patient does not recall how this started with no signs of infection. No other issues.  Assessment and Plan:  Problem List Items Addressed This Visit    None    Visit Diagnoses    Diabetic ulcer of right midfoot associated with type 2 diabetes mellitus, limited to breakdown of skin ([facility])     -  Primary   Toe ulcer, right, limited to breakdown of skin ([facility])       Foot ulceration, left, limited to breakdown of skin ([facility])       Diabetic Charcot's foot ([facility])       Right foot pain       Left foot pain         -Patient seen and evaluated -Silicone graft layer removed and nonselective debrided ulceration sites using saline moistened guaze -[employer] tri-layer matrix 3x7cm (lot [id number]) divided amongst 2 ulceration sites on right and left foot with no waste secured with steri-strips covered with adaptic guaze to graft sites secured with dry sterile dressing secured with Coban and stockinet  -  Advised patient to make sure to keep dressings clean, dry, and intact; Nursing to continue with every dressing changes every other day reapplying adaptic to graft site on right and new ulceration site on left. Refrain from use of tefla -Continue with norco as needed for pain -Levaquin completed -Advised patient to continue with post-op shoe on right foot with felt padding added -Advised patient to limit activity to necessity  -Advised patient to elevate as necessary  -Return 1 week for graft site check. In the meantime, patient to call office if any issues or problems arise.   , DPM 

## 2016-10-11 ENCOUNTER — Encounter: Payer: Self-pay | Admitting: Sports Medicine

## 2016-10-11 ENCOUNTER — Ambulatory Visit (INDEPENDENT_AMBULATORY_CARE_PROVIDER_SITE_OTHER): Payer: Medicare Other | Admitting: Sports Medicine

## 2016-10-11 DIAGNOSIS — L97521 Non-pressure chronic ulcer of other part of left foot limited to breakdown of skin: Secondary | ICD-10-CM

## 2016-10-11 DIAGNOSIS — E11610 Type 2 diabetes mellitus with diabetic neuropathic arthropathy: Secondary | ICD-10-CM

## 2016-10-11 DIAGNOSIS — E11621 Type 2 diabetes mellitus with foot ulcer: Secondary | ICD-10-CM | POA: Diagnosis not present

## 2016-10-11 DIAGNOSIS — M79672 Pain in left foot: Secondary | ICD-10-CM

## 2016-10-11 DIAGNOSIS — L97411 Non-pressure chronic ulcer of right heel and midfoot limited to breakdown of skin: Secondary | ICD-10-CM | POA: Diagnosis not present

## 2016-10-11 DIAGNOSIS — M79671 Pain in right foot: Secondary | ICD-10-CM

## 2016-10-11 NOTE — Progress Notes (Signed)
Subjective: [name] is a 50 y.o. female patient seen today in office for POV #5 (DOS 09-05-16), S/P Right foot wound debridement with placement of graft. Patient has finished antibiotics. Patient denies current pain at surgical site, denies calf pain, denies headache, chest pain, shortness of breath, nausea, vomiting, fever, or chills. Patient has home nursing every other day. Reports nurse has been putting bandaid on left foot sore. No other issues noted.   Patient Active Problem List   Diagnosis Date Noted  . Cancer of sigmoid colon ([place]) 03/09/2015  . OSA (obstructive sleep apnea) 07/04/2014  . Migraine without aura, with intractable migraine, so stated, without mention of status migrainosus 03/26/2014  . Peripheral neuropathy ([place]) 03/03/2014  . Abnormal stress test 02/12/2014  . CVA (cerebral infarction) 02/12/2014  . Chest pain   . Abnormal heart rhythm   . Edema   . Hypertension   . Hyperlipemia   . Hypercholesterolemia   . Other and unspecified hyperlipidemia 11/14/2013  . Type II diabetes mellitus, uncontrolled ([place]) 07/22/2013  . Unspecified essential hypertension 07/22/2013  . 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 Diabetic polyneuropathy ([place]) 07/22/2013    Current Outpatient Prescriptions on File Prior to Visit  Medication Sig Dispense Refill  . amphetamine-dextroamphetamine (ADDERALL) 30 MG tablet Take 30 mg by mouth daily.   0  . atorvastatin (LIPITOR) 40 MG tablet Take 1 tablet (40 mg total) by mouth daily at 6 PM. 90 tablet 3  . BELSOMRA 20 MG TABS Take 1 tablet by mouth daily.    . canagliflozin (INVOKANA) 300 MG TABS tablet TAKE 300 MG BY MOUTH DAILY BEFORE BREAKFAST. (Patient taking differently: Take 300 mg by mouth daily before breakfast. TAKE 300 MG BY MOUTH DAILY BEFORE BREAKFAST.) 30 tablet 3  . CHANTIX 0.5 MG tablet Take 1 mg by mouth daily. Reported on 05/27/2016    . chlorproMAZINE  (THORAZINE) 200 MG tablet Take 400 mg by mouth at bedtime.     . diazepam (VALIUM) 10 MG tablet Take 20 mg by mouth at bedtime.     . docusate sodium (COLACE) 100 MG capsule Take 1 capsule (100 mg total) by mouth 2 (two) times daily. 10 capsule 0  . esomeprazole (NEXIUM) 40 MG capsule Take 40 mg by mouth daily.     . fenofibrate (TRICOR) 145 MG tablet TAKE 1 TABLET (145 MG TOTAL) BY MOUTH DAILY. 30 tablet 5  . glucose blood (FREESTYLE LITE) test strip Use as instructed to check blood sugar 3 times per day dx code E11.65 100 each 5  . HYDROcodone-acetaminophen (NORCO) 10-325 MG tablet Take 1 tablet by mouth every 6 (six) hours as needed. 30 tablet 0  . insulin aspart (NOVOLOG) 100 UNIT/ML injection Use per v-go pump 76 units daily 30 mL 2  . Insulin Disposable Pump (V-GO 40) KIT Use 1 device per day. 1 kit 3  . Lancets (FREESTYLE) lancets Use as instructed to check blood sugar 3 times per day dx code E11.65 100 each 5  . potassium chloride SA (K-DUR,KLOR-CON) 20 MEQ tablet Take 1 tablet (20 mEq total) by mouth daily. 30 tablet 3  . promethazine (PHENERGAN) 12.5 MG tablet Take 1 tablet (12.5 mg total) by mouth every 6 (six) hours as needed for nausea or vomiting. 30 tablet 0  . rizatriptan (MAXALT) 10 MG tablet Take 1 tablet (10 mg total) by mouth as needed for migraine. May repeat in 2 hours if needed (Patient taking differently:  Take 10 mg by mouth as needed for migraine. May repeat in 2 hours if needed) 10 tablet 3  . spironolactone (ALDACTONE) 100 MG tablet Take 1 tablet (100 mg total) by mouth daily. 30 tablet 3  . tamsulosin (FLOMAX) 0.4 MG CAPS capsule Take 0.4 mg by mouth daily.    [name] tiZANidine (ZANAFLEX) 4 MG tablet Take 2 tablets by mouth at bedtime.    . TRULICITY 3.29 JM/4.2AS SOPN Inject 5 mLs as directed once a week. Thursdays    . Vortioxetine HBr (TRINTELLIX) 20 MG TABS Take 25 mg by mouth daily. Takes with a 5 mg tablet to equal 25 mg    . Vortioxetine HBr 5 MG TABS Take 25 mg by  mouth daily. Takes with a 20 mg tablet to equal 25 mg     No current facility-administered medications on file prior to visit.     Allergies  Allergen Reactions  . Latex Itching, Rash and Other (See Comments)    Pt states she cannot use condoms - cause an infection.  Use of latex on skin is okay.  Tape causes rash  . Sweetening Enhancer [Flavoring Agent] Nausea And Vomiting and Other (See Comments)    HEADACHES  . Tetracyclines & Related Nausea And Vomiting and Other (See Comments)    ;YEAST INFECTIONS  . Aspartame And Phenylalanine Nausea And Vomiting    HEADACHES  . Ibuprofen Other (See Comments)    HEADACHES  . Trazodone And Nefazodone Other (See Comments)    Hallucinations   . Triazolam Other (See Comments)    HALLUCINATIONS    Objective: There were no vitals filed for this visit.  General: No acute distress, AAOx3  Right foot: Dorsal 5th toe healed with dry scab. At  Medial midfoot ulceration 3x1cm (similar to previous)  with granular base with no surrounding infection, mild swelling right foot, no streaking erythema/cellulitis, no warmth, no drainage, no gross signs of infection noted, Capillary fill time <3 seconds in all digits, gross sensation present via light touch to right foot. Protective sensation absent. Right foot deformity significant for charcot. No pain or crepitation with range of motion right foot.  No pain with calf compression.   To left foot there is a partial thickness ulceration that measures 1x1cm (similar to previous) likely skin tear with mild maceration but no no other signs of infection. No other issues.  Assessment and Plan:  Problem List Items Addressed This Visit    None    Visit Diagnoses    Diabetic ulcer of right midfoot associated with type 2 diabetes mellitus, limited to breakdown of skin ([employer])    -  Primary   Foot ulceration, left, limited to breakdown of skin ([place])       Diabetic Charcot's foot ([place])       Right foot pain       Left  foot pain         -Patient seen and evaluated -Debrided ulceration sites using saline moistened guaze -[employer] matrix 3x7cm (lot [id number]) divided amongst 2 ulceration sites on right and left foot with no waste secured with steri-strips covered with adaptic guaze to graft sites secured with dry sterile dressing secured with Coban and stockinet  -Advised patient to make sure to keep dressings clean, dry, and intact; Nursing to continue with every dressing changes every other day reapplying adaptic to graft site on right and ulceration site on left. Refrain from use of tefla -Continue with norco as needed for pain -  Advised patient to continue with post-op shoe on right foot with felt padding added -Advised patient to limit activity to necessity  -Advised patient to elevate as necessary  -Return 1 week for graft site check. In the meantime, patient to call office if any issues or problems arise.  Landis Martins, DPM

## 2016-10-12 ENCOUNTER — Telehealth: Payer: Self-pay | Admitting: *Deleted

## 2016-10-12 NOTE — Telephone Encounter (Addendum)
Faxed copy of Dr. Leeanne Rio 10/11/2016 wound care orders. 11/11/2016-[name] - [facility] states pt c/o redness, and swelling around her left heel and ankle, pt states she had some popping and pain to begin with. Olivia Mackie states the wounds look good, pt doesn't have a fever, there is a little swelling to the area. I told Olivia Mackie to continue the wound care to pt's feet and legs and transferred pt to schedulers to schedule for Monday. Prior to transferring to schedulers, I told pt if the area worsens in pain, redness, swelling or she has a fever to go to the ER. Pt states understanding.

## 2016-10-13 ENCOUNTER — Telehealth: Payer: Self-pay | Admitting: Endocrinology

## 2016-10-13 NOTE — Telephone Encounter (Signed)
[facility] called in to check on the refill request for Pt Potassium medication.

## 2016-10-18 ENCOUNTER — Ambulatory Visit (INDEPENDENT_AMBULATORY_CARE_PROVIDER_SITE_OTHER): Payer: Medicare Other | Admitting: Sports Medicine

## 2016-10-18 ENCOUNTER — Encounter: Payer: Self-pay | Admitting: Sports Medicine

## 2016-10-18 ENCOUNTER — Ambulatory Visit: Payer: Medicare Other | Admitting: Endocrinology

## 2016-10-18 DIAGNOSIS — E11621 Type 2 diabetes mellitus with foot ulcer: Secondary | ICD-10-CM

## 2016-10-18 DIAGNOSIS — L97521 Non-pressure chronic ulcer of other part of left foot limited to breakdown of skin: Secondary | ICD-10-CM

## 2016-10-18 DIAGNOSIS — L97411 Non-pressure chronic ulcer of right heel and midfoot limited to breakdown of skin: Secondary | ICD-10-CM

## 2016-10-18 DIAGNOSIS — M79671 Pain in right foot: Secondary | ICD-10-CM

## 2016-10-18 DIAGNOSIS — M79672 Pain in left foot: Secondary | ICD-10-CM

## 2016-10-18 DIAGNOSIS — E11610 Type 2 diabetes mellitus with diabetic neuropathic arthropathy: Secondary | ICD-10-CM

## 2016-10-18 NOTE — Progress Notes (Signed)
Subjective: [name] is a 50 y.o. female patient seen today in office for POV #6 (DOS 09-05-16), S/P Right foot wound debridement with placement of graft.  Patient denies current pain at surgical site, denies calf pain, denies headache, chest pain, shortness of breath, nausea, vomiting, fever, or chills. Patient has home nursing every other day. Reports nurse has been putting a on non-adherant dressing on left as well. No other issues noted.   Patient Active Problem List   Diagnosis Date Noted  . Cancer of sigmoid colon ([place]) 03/09/2015  . OSA (obstructive sleep apnea) 07/04/2014  . Migraine without aura, with intractable migraine, so stated, without mention of status migrainosus 03/26/2014  . Peripheral neuropathy ([place]) 03/03/2014  . Abnormal stress test 02/12/2014  . CVA (cerebral infarction) 02/12/2014  . Chest pain   . Abnormal heart rhythm   . Edema   . Hypertension   . Hyperlipemia   . Hypercholesterolemia   . Other and unspecified hyperlipidemia 11/14/2013  . Type II diabetes mellitus, uncontrolled ([place]) 07/22/2013  . Unspecified essential hypertension 07/22/2013  . 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 Diabetic polyneuropathy ([place]) 07/22/2013    Current Outpatient Prescriptions on File Prior to Visit  Medication Sig Dispense Refill  . amphetamine-dextroamphetamine (ADDERALL) 30 MG tablet Take 30 mg by mouth daily.   0  . atorvastatin (LIPITOR) 40 MG tablet Take 1 tablet (40 mg total) by mouth daily at 6 PM. 90 tablet 3  . BELSOMRA 20 MG TABS Take 1 tablet by mouth daily.    . canagliflozin (INVOKANA) 300 MG TABS tablet TAKE 300 MG BY MOUTH DAILY BEFORE BREAKFAST. (Patient taking differently: Take 300 mg by mouth daily before breakfast. TAKE 300 MG BY MOUTH DAILY BEFORE BREAKFAST.) 30 tablet 3  . CHANTIX 0.5 MG tablet Take 1 mg by mouth daily. Reported on 05/27/2016    . chlorproMAZINE (THORAZINE) 200  MG tablet Take 400 mg by mouth at bedtime.     . diazepam (VALIUM) 10 MG tablet Take 20 mg by mouth at bedtime.     . docusate sodium (COLACE) 100 MG capsule Take 1 capsule (100 mg total) by mouth 2 (two) times daily. 10 capsule 0  . esomeprazole (NEXIUM) 40 MG capsule Take 40 mg by mouth daily.     . fenofibrate (TRICOR) 145 MG tablet TAKE 1 TABLET (145 MG TOTAL) BY MOUTH DAILY. 30 tablet 5  . glucose blood (FREESTYLE LITE) test strip Use as instructed to check blood sugar 3 times per day dx code E11.65 100 each 5  . HYDROcodone-acetaminophen (NORCO) 10-325 MG tablet Take 1 tablet by mouth every 6 (six) hours as needed. 30 tablet 0  . insulin aspart (NOVOLOG) 100 UNIT/ML injection Use per v-go pump 76 units daily 30 mL 2  . Insulin Disposable Pump (V-GO 40) KIT Use 1 device per day. 1 kit 3  . Lancets (FREESTYLE) lancets Use as instructed to check blood sugar 3 times per day dx code E11.65 100 each 5  . potassium chloride SA (K-DUR,KLOR-CON) 20 MEQ tablet Take 1 tablet (20 mEq total) by mouth daily. 30 tablet 3  . promethazine (PHENERGAN) 12.5 MG tablet Take 1 tablet (12.5 mg total) by mouth every 6 (six) hours as needed for nausea or vomiting. 30 tablet 0  . rizatriptan (MAXALT) 10 MG tablet Take 1 tablet (10 mg total) by mouth as needed for migraine. May repeat in 2 hours if needed (Patient  taking differently: Take 10 mg by mouth as needed for migraine. May repeat in 2 hours if needed) 10 tablet 3  . spironolactone (ALDACTONE) 100 MG tablet Take 1 tablet (100 mg total) by mouth daily. 30 tablet 3  . tamsulosin (FLOMAX) 0.4 MG CAPS capsule Take 0.4 mg by mouth daily.    [name] tiZANidine (ZANAFLEX) 4 MG tablet Take 2 tablets by mouth at bedtime.    . TRULICITY 9.50 DT/2.6ZT SOPN Inject 5 mLs as directed once a week. Thursdays    . Vortioxetine HBr (TRINTELLIX) 20 MG TABS Take 25 mg by mouth daily. Takes with a 5 mg tablet to equal 25 mg    . Vortioxetine HBr 5 MG TABS Take 25 mg by mouth daily. Takes  with a 20 mg tablet to equal 25 mg     No current facility-administered medications on file prior to visit.     Allergies  Allergen Reactions  . Latex Itching, Rash and Other (See Comments)    Pt states she cannot use condoms - cause an infection.  Use of latex on skin is okay.  Tape causes rash  . Sweetening Enhancer [Flavoring Agent] Nausea And Vomiting and Other (See Comments)    HEADACHES  . Tetracyclines & Related Nausea And Vomiting and Other (See Comments)    ;YEAST INFECTIONS  . Aspartame And Phenylalanine Nausea And Vomiting    HEADACHES  . Ibuprofen Other (See Comments)    HEADACHES  . Trazodone And Nefazodone Other (See Comments)    Hallucinations   . Triazolam Other (See Comments)    HALLUCINATIONS    Objective: There were no vitals filed for this visit.  General: No acute distress, AAOx3  Right foot: Dorsal 5th toe healed with dry scab. At  Medial midfoot ulceration 2.5x0.5cm (smaller than previous)  with granular base with no surrounding infection, mild swelling right foot, no streaking erythema/cellulitis, no warmth, no drainage, no gross signs of infection noted, Capillary fill time <3 seconds in all digits, gross sensation present via light touch to right foot. Protective sensation absent. Right foot deformity significant for charcot. No pain or crepitation with range of motion right foot.  No pain with calf compression.   To left foot there is a partial thickness ulceration that measures 1.5x1cm (slightly larger than previous) with mild maceration but no no other signs of infection. No other issues.  Assessment and Plan:  Problem List Items Addressed This Visit    None    Visit Diagnoses    Diabetic ulcer of right midfoot associated with type 2 diabetes mellitus, limited to breakdown of skin ([place])    -  Primary   Foot ulceration, left, limited to breakdown of skin ([place])       Diabetic Charcot's foot ([place])       Right foot pain       Left foot pain          -Patient seen and evaluated -Debrided ulcerations site using tissue nipper and saline moistened guaze -[employer] matrix 3x7cm (lot [id number]) divided amongst 2 ulceration sites on right and left foot with no waste secured with steri-strips covered with adaptic guaze to graft sites secured with dry sterile dressing secured with Coban and stockinet  -Advised patient to make sure to keep dressings clean, dry, and intact; Nursing to continue with every dressing changes every other day reapplying adaptic to graft site on right and ulceration site on left. Refrain from use of tefla -Continue with norco as  needed for pain -Advised patient to continue with post-op shoe on right foot with felt padding added. Also dispensed new post op shoe for left foot with felt offloading.  -Advised patient to limit activity to necessity  -Advised patient to elevate as necessary  -Return 1 week for graft site check. In the meantime, patient to call office if any issues or problems arise.  Landis Martins, DPM

## 2016-10-19 ENCOUNTER — Ambulatory Visit (INDEPENDENT_AMBULATORY_CARE_PROVIDER_SITE_OTHER): Payer: Medicare Other | Admitting: Endocrinology

## 2016-10-19 ENCOUNTER — Encounter: Payer: Self-pay | Admitting: Endocrinology

## 2016-10-19 VITALS — BP 128/80 | HR 86 | Ht 65.0 in | Wt 192.0 lb

## 2016-10-19 DIAGNOSIS — E1169 Type 2 diabetes mellitus with other specified complication: Secondary | ICD-10-CM

## 2016-10-19 DIAGNOSIS — E782 Mixed hyperlipidemia: Secondary | ICD-10-CM

## 2016-10-19 DIAGNOSIS — E876 Hypokalemia: Secondary | ICD-10-CM | POA: Diagnosis not present

## 2016-10-19 DIAGNOSIS — IMO0002 Reserved for concepts with insufficient information to code with codable children: Secondary | ICD-10-CM

## 2016-10-19 DIAGNOSIS — E1165 Type 2 diabetes mellitus with hyperglycemia: Secondary | ICD-10-CM

## 2016-10-19 DIAGNOSIS — F331 Major depressive disorder, recurrent, moderate: Secondary | ICD-10-CM | POA: Diagnosis not present

## 2016-10-19 DIAGNOSIS — Z794 Long term (current) use of insulin: Secondary | ICD-10-CM | POA: Diagnosis not present

## 2016-10-19 LAB — COMPREHENSIVE METABOLIC PANEL
ALK PHOS: 88 U/L (ref 39–117)
ALT: 14 U/L (ref 0–35)
AST: 11 U/L (ref 0–37)
Albumin: 3.9 g/dL (ref 3.5–5.2)
BILIRUBIN TOTAL: 0.3 mg/dL (ref 0.2–1.2)
BUN: 6 mg/dL (ref 6–23)
CO2: 28 mEq/L (ref 19–32)
CREATININE: 0.76 mg/dL (ref 0.40–1.20)
Calcium: 9.2 mg/dL (ref 8.4–10.5)
Chloride: 97 mEq/L (ref 96–112)
GFR: 85.14 mL/min (ref >60.00)
GLUCOSE: 353 mg/dL — AB (ref 70–99)
Potassium: 3.4 mEq/L — ABNORMAL LOW (ref 3.5–5.1)
Sodium: 135 mEq/L (ref 135–145)
TOTAL PROTEIN: 6.8 g/dL (ref 6.0–8.3)

## 2016-10-19 LAB — MICROALBUMIN / CREATININE URINE RATIO
Creatinine,U: 22.7 mg/dL
MICROALB/CREAT RATIO: 1.8 mg/g (ref 0.0–30.0)
Microalb, Ur: 0.4 mg/dL (ref 0.0–1.9)

## 2016-10-19 LAB — LIPID PANEL
CHOL/HDL RATIO: 4
Cholesterol: 124 mg/dL (ref 0–200)
HDL: 34.9 mg/dL — ABNORMAL LOW (ref >39.00)
NONHDL: 88.78
Triglycerides: 360 mg/dL — ABNORMAL HIGH (ref 0.0–149.0)
VLDL: 72 mg/dL — ABNORMAL HIGH (ref 0.0–40.0)

## 2016-10-19 LAB — LDL CHOLESTEROL, DIRECT: LDL DIRECT: 52 mg/dL

## 2016-10-19 MED ORDER — METFORMIN HCL ER 500 MG PO TB24: 2000.0000 mg | ORAL_TABLET | Freq: Every day | ORAL | 3 refills | Status: DC

## 2016-10-19 NOTE — Progress Notes (Signed)
Please let patient know that the.Potassium is slightly low, needs to confirm that she is taking 100 mg of Aldactone

## 2016-10-19 NOTE — Progress Notes (Signed)
Patient ID: [name], female   DOB: Jun 30, 1966, 50 y.o.   MRN: [id number]   Reason for Appointment: Diabetes follow-up    History of Present Illness:   Diagnosis: Type 2 diabetes mellitus, date of diagnosis: 2004.  PAST history: She has been treated mostly with insulin since about a year after diagnosis. She has had difficulty with consistent compliance with diet and also compliance with self care including glucose monitoring over the years. She had been mostly treated with basal insulin. Also had been tried on mealtime insulin but she would be noncompliant with this. Was tried on Prandin for mealtime control but difficult to judge efficacy because of lack of postprandial monitoring. Was given Victoza to start in 2011 but did not follow up after this.She was tried on premixed insulin but this did not help her control, mostly because of noncompliance with the doses. She had been using the V- go pump and had better control initially and was better compliant with the daily routine and boluses. She has had frequent education visits also. She stopped using her V.-go pump in 6/15 because of discomfort at the site of application Also did not improve with a trial of Victoza   RECENT history:    INSULIN dose:  V-go 40 unit pump,  Mealtime bolus 4-6 clicks, 3-4 x daily Non-insulin hypoglycemic drugs: Invokana 300 mg in the morning, currently not on Trulicity 9.79 mg weekly  Her A1c previously has been consistently high and recently 9.7 although previously has been higher  She did start the V-go pump in early June because of her poor control especially postprandially  Current management and problems identified:  She has not checked her blood sugarsexcept rarely and gives inconsistent answers to what her sugars are.  She continues to have significant hyperglycemia especially after meals including today when blood sugar is over 300.  She does not check her blood sugar fasting usually  and not clear if she is getting adequate control of overnight readings with her 40 units basal.  Also gets significant hyperglycemia with her continuing to drink regular drinks with sugar and today had [facility] also.  She has little motivation to take care of her diabetes   Glucometer:  FreeStyle.  Checking blood sugar  As above Blood Glucose readings:    RANGE: 300+  Hypoglycemia frequency: Never.    Food preferences: eating 1 or 2 meals per day, variable intake, sandwiches at times or otherwise snacks, dinner usually 8 PM  Physical activity: exercise: Minimal.  She has difficulties with leg pain Certified Diabetes Educator visit: Most recent:, 2/14.   The diet that the patient has been following is no specific diet; still eating at irregular times; still drinking drinks with sugar    Wt Readings from Last 3 Encounters:  10/19/16 192 lb (87.1 kg)  09/20/16 196 lb 12.8 oz (89.3 kg)  09/13/16 198 lb (89.8 kg)   DM labs:   Lab Results  Component Value Date   HGBA1C 9.7 (H) 09/05/2016   HGBA1C 10.2 (H) 05/25/2016   HGBA1C 12.3 (H) 02/29/2016   Lab Results  Component Value Date   MICROALBUR 0.4 10/19/2016   LDLCALC 41 01/01/2016   CREATININE 0.76 10/19/2016    Office Visit on 10/19/2016  Component Date Value Ref Range Status  . Sodium 10/19/2016 135  135 - 145 mEq/L Final  . Potassium 10/19/2016 3.4* 3.5 - 5.1 mEq/L Final  . Chloride 10/19/2016 97  96 - 112 mEq/L Final  .  CO2 10/19/2016 28  19 - 32 mEq/L Final  . Glucose, Bld 10/19/2016 353* 70 - 99 mg/dL Final  . BUN 10/19/2016 6  6 - 23 mg/dL Final  . Creatinine, Ser 10/19/2016 0.76  0.40 - 1.20 mg/dL Final  . Total Bilirubin 10/19/2016 0.3  0.2 - 1.2 mg/dL Final  . Alkaline Phosphatase 10/19/2016 88  39 - 117 U/L Final  . AST 10/19/2016 11  0 - 37 U/L Final  . ALT 10/19/2016 14  0 - 35 U/L Final  . Total Protein 10/19/2016 6.8  6.0 - 8.3 g/dL Final  . Albumin 10/19/2016 3.9  3.5 - 5.2 g/dL Final  . Calcium  10/19/2016 9.2  8.4 - 10.5 mg/dL Final  . GFR 10/19/2016 85.14  >60.00 mL/min Final  . Microalb, Ur 10/19/2016 0.4  0.0 - 1.9 mg/dL Final  . Creatinine,U 10/19/2016 22.7  mg/dL Final  . Microalb Creat Ratio 10/19/2016 1.8  0.0 - 30.0 mg/g Final  . Cholesterol 10/19/2016 124  0 - 200 mg/dL Final  . Triglycerides 10/19/2016 360.0* 0.0 - 149.0 mg/dL Final  . HDL 10/19/2016 34.90* >39.00 mg/dL Final  . VLDL 10/19/2016 72.0* 0.0 - 40.0 mg/dL Final  . Total CHOL/HDL Ratio 10/19/2016 4   Final  . NonHDL 10/19/2016 88.78   Final  . Direct LDL 10/19/2016 52.0  mg/dL Final          Medication List       Accurate as of 10/19/16  9:34 PM. Always use your most recent med list.          amphetamine-dextroamphetamine 30 MG tablet Commonly known as:  ADDERALL Take 30 mg by mouth daily.   atorvastatin 40 MG tablet Commonly known as:  LIPITOR Take 1 tablet (40 mg total) by mouth daily at 6 PM.   BELSOMRA 20 MG Tabs Generic drug:  Suvorexant Take 1 tablet by mouth daily.   canagliflozin 300 MG Tabs tablet Commonly known as:  INVOKANA TAKE 300 MG BY MOUTH DAILY BEFORE BREAKFAST.   CHANTIX 0.5 MG tablet Generic drug:  varenicline Take 1 mg by mouth daily. Reported on 05/27/2016   chlorproMAZINE 200 MG tablet Commonly known as:  THORAZINE Take 400 mg by mouth at bedtime.   diazepam 10 MG tablet Commonly known as:  VALIUM Take 20 mg by mouth at bedtime.   docusate sodium 100 MG capsule Commonly known as:  COLACE Take 1 capsule (100 mg total) by mouth 2 (two) times daily.   esomeprazole 40 MG capsule Commonly known as:  NEXIUM Take 40 mg by mouth daily.   fenofibrate 145 MG tablet Commonly known as:  TRICOR TAKE 1 TABLET (145 MG TOTAL) BY MOUTH DAILY.   freestyle lancets Use as instructed to check blood sugar 3 times per day dx code E11.65   glucose blood test strip Commonly known as:  FREESTYLE LITE Use as instructed to check blood sugar 3 times per day dx code E11.65    HYDROcodone-acetaminophen 10-325 MG tablet Commonly known as:  NORCO Take 1 tablet by mouth every 6 (six) hours as needed.   insulin aspart 100 UNIT/ML injection Commonly known as:  NOVOLOG Use per v-go pump 76 units daily   promethazine 12.5 MG tablet Commonly known as:  PHENERGAN Take 1 tablet (12.5 mg total) by mouth every 6 (six) hours as needed for nausea or vomiting.   rizatriptan 10 MG tablet Commonly known as:  MAXALT Take 1 tablet (10 mg total) by mouth as needed for migraine.  May repeat in 2 hours if needed   spironolactone 100 MG tablet Commonly known as:  ALDACTONE Take 1 tablet (100 mg total) by mouth daily.   tamsulosin 0.4 MG Caps capsule Commonly known as:  FLOMAX Take 0.4 mg by mouth daily.   tiZANidine 4 MG tablet Commonly known as:  ZANAFLEX Take 2 tablets by mouth at bedtime.   TRULICITY 8.50 YD/7.4JO Sopn Generic drug:  Dulaglutide Inject 5 mLs as directed once a week. Thursdays   V-GO 40 Kit Use 1 device per day.   vortioxetine HBr 5 MG Tabs Commonly known as:  TRINTELLIX Take 25 mg by mouth daily. Takes with a 20 mg tablet to equal 25 mg   TRINTELLIX 20 MG Tabs Generic drug:  vortioxetine HBr Take 25 mg by mouth daily. Takes with a 5 mg tablet to equal 25 mg       Allergies:  Allergies  Allergen Reactions  . Latex Itching, Rash and Other (See Comments)    Pt states she cannot use condoms - cause an infection.  Use of latex on skin is okay.  Tape causes rash  . Sweetening Enhancer [Flavoring Agent] Nausea And Vomiting and Other (See Comments)    HEADACHES  . Tetracyclines & Related Nausea And Vomiting and Other (See Comments)    ;YEAST INFECTIONS  . Aspartame And Phenylalanine Nausea And Vomiting    HEADACHES  . Ibuprofen Other (See Comments)    HEADACHES  . Trazodone And Nefazodone Other (See Comments)    Hallucinations   . Triazolam Other (See Comments)    HALLUCINATIONS    Past Medical History:  Diagnosis Date  . Anginal  pain ([place])   . Anxiety   . Arthritis    "qwhere" (02/12/2014)  . Bipolar disorder ([place])   . Cancer [facility])    colon cancer  . Chest pain    a. 2008 Cath: normal cors;  b. 12/2013 [facility] CL: EF 47%, no ischemia/infarct.  . Cholecystitis   . Chronic back pain   . Chronic pain   . Claudication ([place])    a. 12/2013 ABI's: R 0.97, L 0.94.  [name] COPD (chronic obstructive pulmonary disease) ([place])   . Depression   . Diabetic foot ulcer ([facility])    chronic  . DJD (degenerative joint disease)   . Edema   . Gastroparesis   . GERD (gastroesophageal reflux disease)   . H/O hiatal hernia   . INOMVEHM(094.7)    "weekly" (02/12/2014)  . History of blood transfusion 1986   "related to lost a child" (02/12/2014)  . Hypercholesterolemia   . Hyperlipemia   . Hypersomnia   . Hypertension   . Insomnia   . MRSA (methicillin resistant Staphylococcus aureus)   . Neurogenic bladder   . OSA (obstructive sleep apnea) 07/04/2014  . Palpitations   . Personality disorder   . Pneumonia    "twice, I think" (02/12/2014)  . Sleep apnea    didn't tolerate cpap (02/12/2014)  . SOB (shortness of breath) on exertion   . Stroke ([place]) 02/12/2014   "eyesight is messed up; not steady on my feet" (02/12/2014)  . SUI (stress urinary incontinence, female) S/P SLING 12-29-2011  . Tobacco abuse   . Type II diabetes mellitus ([place])   . Ulcer ([place])   . Vertigo     Past Surgical History:  Procedure Laterality Date  . ABDOMINAL HYSTERECTOMY    . ANTERIOR CERVICAL DECOMP/DISCECTOMY FUSION  2000   C6 - 7  . CARDIAC CATHETERIZATION  05-22-2008   DR SKAINS   NO SIGNIFECANT CAD/ NORMAL LV/  EF 65%/  NO WALL MOTION ABNORMALITIES  . CARDIAC CATHETERIZATION  02/12/2014  . CARPAL TUNNEL RELEASE Right 04-25-2013  . [place]; 1992  . CYSTO N/A 04/30/2013   Procedure: CYSTOSCOPY;  Surgeon: Reece Packer, MD;  Location: [facility];  Service: Urology;  Laterality: N/A;  . CYSTOSCOPY WITH INJECTION  05/04/2012   Procedure:  CYSTOSCOPY WITH INJECTION;  Surgeon: Reece Packer, MD;  Location: [facility];  Service: Urology;  Laterality: N/A;  MACROPLASTIQUE INJECTION  . CYSTOSCOPY WITH INJECTION  08/28/2012   Procedure: CYSTOSCOPY WITH INJECTION;  Surgeon: Reece Packer, MD;  Location[facility];  Service: Urology;  Laterality: N/A;  cysto and macroplastique   . FOOT SURGERY Bilateral   . HERNIA REPAIR  ?1996   "stomach"  . KNEE ARTHROSCOPY W/ ALLOGRAFT IMPANT Left    graft x 2  . KNEE SURGERY     TOTAL 8 SURG'S  . LAPAROSCOPIC SIGMOID COLECTOMY  04/22/2015  . LAPAROSCOPIC SIGMOID COLECTOMY N/A 04/22/2015   Procedure: LAPAROSCOPIC HAND ASSISTED SIGMOID COLECTOMY;  Surgeon: Erroll Luna, MD;  Location: [place] [facility];  Service: General;  Laterality: N/A;  . LEFT HEART CATHETERIZATION WITH CORONARY ANGIOGRAM N/A 02/12/2014   Procedure: LEFT HEART CATHETERIZATION WITH CORONARY ANGIOGRAM;  Surgeon: Candee Furbish, MD;  Location: [facility];  Service: Cardiovascular;  Laterality: N/A;  . LUMBAR FUSION     "cage in my spine"  . MULTIPLE LAPAROSCOPIES FOR ENDOMETRIOSIS    . PUBOVAGINAL SLING  12/29/2011   Procedure: Gaynelle Arabian;  Surgeon: Reece Packer, MD;  Location: [facility];  Service: Urology;  Laterality: N/A;  cysto and sparc sling   . PUBOVAGINAL SLING N/A 04/30/2013   Procedure: REMOVAL OF VAGINAL MESH;  Surgeon: Reece Packer, MD;  Location: [facility];  Service: Urology;  Laterality: N/A;  . RECONSTURCTION OF CONGENITAL UTERUS ANOMALY  1983  . REPEAT RECONSTRUCTION ACL LEFT KNEE/ SCREWS REMOVED  03-28-2000   CADAVER GRAFT  . TOTAL ABDOMINAL HYSTERECTOMY W/ BILATERAL SALPINGOOPHORECTOMY  1997  . WOUND DEBRIDEMENT Right 09/05/2016   Procedure: DEBRIDEMENT WOUND WITH GRAFT RIGHT FOOT;  Surgeon: Landis Martins, DPM;  Location: [facility];  Service: Podiatry;  Laterality: Right;    Family History  Problem Relation Age of Onset  . Hypertension Mother   .  Diabetes Mother   . Cancer - Other Mother     lymphoma   . Cancer - Other Father     lung, bladder cancer   . Heart attack Father   . Cancer - Other Brother     bladder cancer     Social History:  reports that she has been smoking Cigarettes and E-cigarettes.  She has a 10.25 pack-year smoking history. She has never used smokeless tobacco. She reports that she does not drink alcohol or use drugs.  Review of Systems -   DEPRESSION: She was dismissed by her psychiatrist.  Continues to have depression  She has history of high triglycerides treated with fenofibrate   Currently she is taking Lipitor 40 mg for high LDL  Lab Results  Component Value Date   CHOL 124 10/19/2016   HDL 34.90 (L) 10/19/2016   LDLCALC 41 01/01/2016   LDLDIRECT 52.0 10/19/2016   TRIG 360.0 (H) 10/19/2016   CHOLHDL 4 10/19/2016    Hypertension: This has been relatively mild and controlled . She is Recently taking spironolactone  100 mg daily  HYPOKALEMIA:  Her last potassium was Normal she does not like to take any potassium supplements   Lab Results  Component Value Date   CREATININE 0.76 10/19/2016   BUN 6 10/19/2016   NA 135 10/19/2016   K 3.4 (L) 10/19/2016   CL 97 10/19/2016   CO2 28 10/19/2016    Painful neuropathy: She has had persistent symptoms including leg pains and numbness as well as difficulty with balance and has tried various drugs including gabapentin without much relief  Foot exam Last showed absent pedal pulses and distal sensation    Examination:   BP 128/80   Pulse 86   Ht '5\' 5"'  (1.651 m)   Wt 192 lb (87.1 kg)   SpO2 98%   BMI 31.95 kg/m   Body mass index is 31.95 kg/m.   Mild  facial puffiness present   Assesment/Plan:   1. Diabetes type 2, with persistently poor control, BMI 35 See history of present illness for detailed discussion of current diabetes management, blood sugar patterns and problems identified  She has had poor control this year Difficult to  assess her level of control because of lack of adequate glucose monitoring at home. Glucose is over 300 today She continues to have some drinks with sugar and likely to be following her diet. Not clear if her fasting readings are controlled, she does not check these at home and does not come for appointments fasting. Most likely she needs more bolus insulin Also may benefit from adding metformin with improved fasting readings and insulin sensitivity even though she thinks it did not help before  Blood sugars still go up to over 400 and not clear whether this is difficulty with compliance with diet, sweet drinks or managing her pump as directed Since fasting reading was 162 in the hospital it is likely that most of her high readings at after eating or drinking  Recommendations:  Restart glucose monitoring as discussed at various times  Increase mealtime boluses by at least 2 units  She will check blood sugars with every meal and take extra 1-to clicks for high readings  Keep cutting back on drinks with sugar  Restart metformin  2. DEPRESSION: She continues to be depressed and this is affecting her motivation to control her diabetes. She will be referred to a new psychiatrist   3.  She will have potassium rechecked today, needs to confirm that she is taking 100 mg of Aldactone   Patient Instructions  Take sugar in am at times  Take 5-7 clicks and for Gatorade 3-4 clicks   Also check blood sugars about 2 hours after a meal and do this after different meals by rotation  Recommended blood sugar levels on waking up is 90-130 and about 2 hours after meal is 130-160  Please bring your blood sugar monitor to each visit, thank you         Counseling time on subjects discussed above is over 50% of today's 25 minute visit   Taedyn Glasscock 10/19/2016, 9:34 PM     K 3.4

## 2016-10-19 NOTE — Patient Instructions (Addendum)
Take sugar in am at times  Take 5-7 clicks and for Gatorade 3-4 clicks   Also check blood sugars about 2 hours after a meal and do this after different meals by rotation  Recommended blood sugar levels on waking up is 90-130 and about 2 hours after meal is 130-160  Please bring your blood sugar monitor to each visit, thank you

## 2016-10-21 ENCOUNTER — Other Ambulatory Visit: Payer: Self-pay

## 2016-10-21 DIAGNOSIS — Z23 Encounter for immunization: Secondary | ICD-10-CM

## 2016-10-21 MED ORDER — SPIRONOLACTONE 100 MG PO TABS: 100.0000 mg | ORAL_TABLET | Freq: Every day | ORAL | 3 refills | Status: DC

## 2016-10-24 ENCOUNTER — Other Ambulatory Visit: Payer: Self-pay | Admitting: Endocrinology

## 2016-10-25 ENCOUNTER — Encounter: Payer: Self-pay | Admitting: Sports Medicine

## 2016-10-25 ENCOUNTER — Ambulatory Visit (INDEPENDENT_AMBULATORY_CARE_PROVIDER_SITE_OTHER): Payer: Medicare Other | Admitting: Sports Medicine

## 2016-10-25 DIAGNOSIS — E11621 Type 2 diabetes mellitus with foot ulcer: Secondary | ICD-10-CM

## 2016-10-25 DIAGNOSIS — L97521 Non-pressure chronic ulcer of other part of left foot limited to breakdown of skin: Secondary | ICD-10-CM

## 2016-10-25 DIAGNOSIS — M79671 Pain in right foot: Secondary | ICD-10-CM

## 2016-10-25 DIAGNOSIS — L97411 Non-pressure chronic ulcer of right heel and midfoot limited to breakdown of skin: Secondary | ICD-10-CM | POA: Diagnosis not present

## 2016-10-25 DIAGNOSIS — E11610 Type 2 diabetes mellitus with diabetic neuropathic arthropathy: Secondary | ICD-10-CM

## 2016-10-25 DIAGNOSIS — M79672 Pain in left foot: Secondary | ICD-10-CM

## 2016-10-25 DIAGNOSIS — E1142 Type 2 diabetes mellitus with diabetic polyneuropathy: Secondary | ICD-10-CM

## 2016-10-25 MED ORDER — GABAPENTIN 300 MG PO CAPS: 300.0000 mg | ORAL_CAPSULE | Freq: Every day | ORAL | 3 refills | Status: DC

## 2016-10-25 NOTE — Progress Notes (Signed)
Subjective: [name] is a 50 y.o. female patient seen today in office for follow up ulcer care. Patient is POV #7 (DOS 09-05-16), S/P Right foot wound debridement with placement of graft.  Patient denies current pain at surgical site, denies calf pain, denies headache, chest pain, shortness of breath, nausea, vomiting, fever, or chills. Patient has home nursing every other day. No other issues noted.   Patient Active Problem List   Diagnosis Date Noted  . Cancer of sigmoid colon ([place]) 03/09/2015  . OSA (obstructive sleep apnea) 07/04/2014  . Migraine without aura, with intractable migraine, so stated, without mention of status migrainosus 03/26/2014  . Peripheral neuropathy ([place]) 03/03/2014  . Abnormal stress test 02/12/2014  . CVA (cerebral infarction) 02/12/2014  . Chest pain   . Abnormal heart rhythm   . Edema   . Hypertension   . Hyperlipemia   . Hypercholesterolemia   . Other and unspecified hyperlipidemia 11/14/2013  . Type II diabetes mellitus, uncontrolled ([employer]) 07/22/2013  . Unspecified essential hypertension 07/22/2013  . 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 Diabetic polyneuropathy ([place]) 07/22/2013    Current Outpatient Prescriptions on File Prior to Visit  Medication Sig Dispense Refill  . amphetamine-dextroamphetamine (ADDERALL) 30 MG tablet Take 30 mg by mouth daily.   0  . atorvastatin (LIPITOR) 40 MG tablet Take 1 tablet (40 mg total) by mouth daily at 6 PM. 90 tablet 3  . BELSOMRA 20 MG TABS Take 1 tablet by mouth daily.    . canagliflozin (INVOKANA) 300 MG TABS tablet TAKE 300 MG BY MOUTH DAILY BEFORE BREAKFAST. (Patient taking differently: Take 300 mg by mouth daily before breakfast. TAKE 300 MG BY MOUTH DAILY BEFORE BREAKFAST.) 30 tablet 3  . CHANTIX 0.5 MG tablet Take 1 mg by mouth daily. Reported on 05/27/2016    . chlorproMAZINE (THORAZINE) 200 MG tablet Take 400 mg by mouth at bedtime.      . diazepam (VALIUM) 10 MG tablet Take 20 mg by mouth at bedtime.     . docusate sodium (COLACE) 100 MG capsule Take 1 capsule (100 mg total) by mouth 2 (two) times daily. 10 capsule 0  . esomeprazole (NEXIUM) 40 MG capsule Take 40 mg by mouth daily.     . fenofibrate (TRICOR) 145 MG tablet TAKE 1 TABLET (145 MG TOTAL) BY MOUTH DAILY. 30 tablet 5  . glucose blood (FREESTYLE LITE) test strip Use as instructed to check blood sugar 3 times per day dx code E11.65 100 each 5  . HYDROcodone-acetaminophen (NORCO) 10-325 MG tablet Take 1 tablet by mouth every 6 (six) hours as needed. 30 tablet 0  . insulin aspart (NOVOLOG) 100 UNIT/ML injection Use per v-go pump 76 units daily 30 mL 2  . Insulin Disposable Pump (V-GO 40) KIT Use 1 device per day. 1 kit 3  . Lancets (FREESTYLE) lancets Use as instructed to check blood sugar 3 times per day dx code E11.65 100 each 5  . metFORMIN (GLUCOPHAGE-XR) 500 MG 24 hr tablet Take 4 tablets (2,000 mg total) by mouth daily with supper. 120 tablet 3  . potassium chloride SA (K-DUR,KLOR-CON) 20 MEQ tablet TAKE (1) TABLET BY MOUTH EVERY DAY 30 tablet 10  . potassium chloride SA (K-DUR,KLOR-CON) 20 MEQ tablet TAKE (1) TABLET BY MOUTH EVERY DAY 30 tablet 11  . promethazine (PHENERGAN) 12.5 MG tablet Take 1 tablet (12.5 mg total) by mouth every 6 (six) hours as needed for  nausea or vomiting. 30 tablet 0  . rizatriptan (MAXALT) 10 MG tablet Take 1 tablet (10 mg total) by mouth as needed for migraine. May repeat in 2 hours if needed (Patient taking differently: Take 10 mg by mouth as needed for migraine. May repeat in 2 hours if needed) 10 tablet 3  . spironolactone (ALDACTONE) 100 MG tablet Take 1 tablet (100 mg total) by mouth daily. 90 tablet 3  . tamsulosin (FLOMAX) 0.4 MG CAPS capsule Take 0.4 mg by mouth daily.    [name] tiZANidine (ZANAFLEX) 4 MG tablet Take 2 tablets by mouth at bedtime.    . TRULICITY 3.54 TG/2.5WL SOPN Inject 5 mLs as directed once a week. Thursdays     . Vortioxetine HBr (TRINTELLIX) 20 MG TABS Take 25 mg by mouth daily. Takes with a 5 mg tablet to equal 25 mg    . Vortioxetine HBr 5 MG TABS Take 25 mg by mouth daily. Takes with a 20 mg tablet to equal 25 mg     No current facility-administered medications on file prior to visit.     Allergies  Allergen Reactions  . Latex Itching, Rash and Other (See Comments)    Pt states she cannot use condoms - cause an infection.  Use of latex on skin is okay.  Tape causes rash  . Sweetening Enhancer [Flavoring Agent] Nausea And Vomiting and Other (See Comments)    HEADACHES  . Tetracyclines & Related Nausea And Vomiting and Other (See Comments)    ;YEAST INFECTIONS  . Aspartame And Phenylalanine Nausea And Vomiting    HEADACHES  . Ibuprofen Other (See Comments)    HEADACHES  . Trazodone And Nefazodone Other (See Comments)    Hallucinations   . Triazolam Other (See Comments)    HALLUCINATIONS    Objective: There were no vitals filed for this visit.  General: No acute distress, AAOx3  Right foot: Dorsal 5th toe healed with dry scab. At  Medial midfoot ulceration 2.3x0.5cm (smaller than previous)  with granular base with no surrounding infection, mild swelling right foot, no streaking erythema/cellulitis, no warmth, no drainage, no gross signs of infection noted, Capillary fill time <3 seconds in all digits, gross sensation present via light touch to right foot. Protective sensation absent. Right foot deformity significant for charcot. No pain or crepitation with range of motion right foot.  No pain with calf compression.   To left foot there is a partial thickness ulceration that measures 1.3x1cm (slightly smaller than previous) with no other signs of infection. No other issues.  Assessment and Plan:  Problem List Items Addressed This Visit    None    Visit Diagnoses    Foot ulceration, left, limited to breakdown of skin ([place])    -  Primary   Diabetic ulcer of right midfoot associated  with type 2 diabetes mellitus, limited to breakdown of skin ([place])       Diabetic Charcot's foot ([place])       Right foot pain       Left foot pain       Diabetic polyneuropathy associated with type 2 diabetes mellitus ([facility])       Relevant Medications   gabapentin (NEURONTIN) 300 MG capsule     -Patient seen and evaluated -Debrided ulcerations site using tissue nipper and saline moistened guaze -[employer] matrix 3x7cm (lot [id number]) divided amongst 2 ulceration sites on right and left foot with no waste secured with steri-strips covered with adaptic guaze to graft  sites secured with dry sterile dressing secured with Coban and stockinet  -Advised patient to make sure to keep dressings clean, dry, and intact; Nursing to continue with every dressing changes every other day reapplying adaptic to graft site on right and ulceration site on left. Refrain from use of tefla -Rx Gabapentin 327m qhs  -Advised patient to continue with post-op shoe on right and left foot with felt padding -Advised patient to limit activity to necessity  -Advised patient to elevate as necessary  -Return 1 week for graft site checks. In the meantime, patient to call office if any issues or problems arise.  TLandis Martins DPM

## 2016-11-01 ENCOUNTER — Encounter: Payer: Self-pay | Admitting: Sports Medicine

## 2016-11-01 ENCOUNTER — Ambulatory Visit (INDEPENDENT_AMBULATORY_CARE_PROVIDER_SITE_OTHER): Payer: Medicare Other | Admitting: Sports Medicine

## 2016-11-01 DIAGNOSIS — L97411 Non-pressure chronic ulcer of right heel and midfoot limited to breakdown of skin: Secondary | ICD-10-CM | POA: Diagnosis not present

## 2016-11-01 DIAGNOSIS — E11621 Type 2 diabetes mellitus with foot ulcer: Secondary | ICD-10-CM

## 2016-11-01 DIAGNOSIS — L97521 Non-pressure chronic ulcer of other part of left foot limited to breakdown of skin: Secondary | ICD-10-CM

## 2016-11-01 DIAGNOSIS — M79671 Pain in right foot: Secondary | ICD-10-CM

## 2016-11-01 DIAGNOSIS — E11610 Type 2 diabetes mellitus with diabetic neuropathic arthropathy: Secondary | ICD-10-CM

## 2016-11-01 NOTE — Progress Notes (Signed)
Subjective: [name] is a 50 y.o. female patient seen today in office for follow up ulcer care bilateral. Patient is POV #8 (DOS 09-05-16), S/P Right foot wound debridement with placement of graft.  Patient denies current pain at surgical site, denies calf pain, denies headache, chest pain, shortness of breath, nausea, vomiting, fever, or chills. Patient has home nursing every other day. No other issues noted.   Patient Active Problem List   Diagnosis Date Noted  . Cancer of sigmoid colon ([place]) 03/09/2015  . OSA (obstructive sleep apnea) 07/04/2014  . Migraine without aura, with intractable migraine, so stated, without mention of status migrainosus 03/26/2014  . Peripheral neuropathy ([place]) 03/03/2014  . Abnormal stress test 02/12/2014  . CVA (cerebral infarction) 02/12/2014  . Chest pain   . Abnormal heart rhythm   . Edema   . Hypertension   . Hyperlipemia   . Hypercholesterolemia   . Other and unspecified hyperlipidemia 11/14/2013  . Type II diabetes mellitus, uncontrolled ([place]) 07/22/2013  . Unspecified essential hypertension 07/22/2013  . 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 Diabetic polyneuropathy ([place]) 07/22/2013    Current Outpatient Prescriptions on File Prior to Visit  Medication Sig Dispense Refill  . amphetamine-dextroamphetamine (ADDERALL) 30 MG tablet Take 30 mg by mouth daily.   0  . atorvastatin (LIPITOR) 40 MG tablet Take 1 tablet (40 mg total) by mouth daily at 6 PM. 90 tablet 3  . BELSOMRA 20 MG TABS Take 1 tablet by mouth daily.    . canagliflozin (INVOKANA) 300 MG TABS tablet TAKE 300 MG BY MOUTH DAILY BEFORE BREAKFAST. (Patient taking differently: Take 300 mg by mouth daily before breakfast. TAKE 300 MG BY MOUTH DAILY BEFORE BREAKFAST.) 30 tablet 3  . CHANTIX 0.5 MG tablet Take 1 mg by mouth daily. Reported on 05/27/2016    . chlorproMAZINE (THORAZINE) 200 MG tablet Take 400 mg by mouth  at bedtime.     . diazepam (VALIUM) 10 MG tablet Take 20 mg by mouth at bedtime.     . docusate sodium (COLACE) 100 MG capsule Take 1 capsule (100 mg total) by mouth 2 (two) times daily. 10 capsule 0  . esomeprazole (NEXIUM) 40 MG capsule Take 40 mg by mouth daily.     . fenofibrate (TRICOR) 145 MG tablet TAKE 1 TABLET (145 MG TOTAL) BY MOUTH DAILY. 30 tablet 5  . gabapentin (NEURONTIN) 300 MG capsule Take 1 capsule (300 mg total) by mouth at bedtime. 90 capsule 3  . glucose blood (FREESTYLE LITE) test strip Use as instructed to check blood sugar 3 times per day dx code E11.65 100 each 5  . HYDROcodone-acetaminophen (NORCO) 10-325 MG tablet Take 1 tablet by mouth every 6 (six) hours as needed. 30 tablet 0  . insulin aspart (NOVOLOG) 100 UNIT/ML injection Use per v-go pump 76 units daily 30 mL 2  . Insulin Disposable Pump (V-GO 40) KIT Use 1 device per day. 1 kit 3  . Lancets (FREESTYLE) lancets Use as instructed to check blood sugar 3 times per day dx code E11.65 100 each 5  . metFORMIN (GLUCOPHAGE-XR) 500 MG 24 hr tablet Take 4 tablets (2,000 mg total) by mouth daily with supper. 120 tablet 3  . potassium chloride SA (K-DUR,KLOR-CON) 20 MEQ tablet TAKE (1) TABLET BY MOUTH EVERY DAY 30 tablet 10  . potassium chloride SA (K-DUR,KLOR-CON) 20 MEQ tablet TAKE (1) TABLET BY MOUTH EVERY DAY 30 tablet 11  .  promethazine (PHENERGAN) 12.5 MG tablet Take 1 tablet (12.5 mg total) by mouth every 6 (six) hours as needed for nausea or vomiting. 30 tablet 0  . rizatriptan (MAXALT) 10 MG tablet Take 1 tablet (10 mg total) by mouth as needed for migraine. May repeat in 2 hours if needed (Patient taking differently: Take 10 mg by mouth as needed for migraine. May repeat in 2 hours if needed) 10 tablet 3  . spironolactone (ALDACTONE) 100 MG tablet Take 1 tablet (100 mg total) by mouth daily. 90 tablet 3  . tamsulosin (FLOMAX) 0.4 MG CAPS capsule Take 0.4 mg by mouth daily.    [name] tiZANidine (ZANAFLEX) 4 MG tablet Take  2 tablets by mouth at bedtime.    . TRULICITY 9.83 JA/2.5KN SOPN Inject 5 mLs as directed once a week. Thursdays    . Vortioxetine HBr (TRINTELLIX) 20 MG TABS Take 25 mg by mouth daily. Takes with a 5 mg tablet to equal 25 mg    . Vortioxetine HBr 5 MG TABS Take 25 mg by mouth daily. Takes with a 20 mg tablet to equal 25 mg     No current facility-administered medications on file prior to visit.     Allergies  Allergen Reactions  . Latex Itching, Rash and Other (See Comments)    Pt states she cannot use condoms - cause an infection.  Use of latex on skin is okay.  Tape causes rash  . Sweetening Enhancer [Flavoring Agent] Nausea And Vomiting and Other (See Comments)    HEADACHES  . Tetracyclines & Related Nausea And Vomiting and Other (See Comments)    ;YEAST INFECTIONS  . Aspartame And Phenylalanine Nausea And Vomiting    HEADACHES  . Ibuprofen Other (See Comments)    HEADACHES  . Trazodone And Nefazodone Other (See Comments)    Hallucinations   . Triazolam Other (See Comments)    HALLUCINATIONS    Objective: There were no vitals filed for this visit.  General: No acute distress, AAOx3  Right foot: Dorsal 5th toe healed with dry scab. At  Medial midfoot ulceration 2x0.5cm (smaller than previous)  with granular base with no surrounding infection, mild swelling right foot, no streaking erythema/cellulitis, no warmth, no drainage, no gross signs of infection noted, Capillary fill time <3 seconds in all digits, gross sensation present via light touch to right foot. Protective sensation absent. Right foot deformity significant for charcot. No pain or crepitation with range of motion right foot.  No pain with calf compression.   To left foot there is a partial thickness ulceration that measures 1x1cm (slightly smaller than previous) with no other signs of infection. No other issues.  Assessment and Plan:  Problem List Items Addressed This Visit    None    Visit Diagnoses    Diabetic  ulcer of right midfoot associated with type 2 diabetes mellitus, limited to breakdown of skin ([place])    -  Primary   Foot ulceration, left, limited to breakdown of skin ([place])       Diabetic Charcot's foot ([place])       Right foot pain         -Patient seen and evaluated -Debrided ulcerations site using tissue nipper and saline moistened guaze -[employer] matrix 3x7cm (lot [id number]) divided amongst 2 ulceration sites on right and left foot with no waste secured with steri-strips covered with adaptic guaze to graft sites secured with dry sterile dressing secured with Coban and stockinet  -Advised patient to  make sure to keep dressings clean, dry, and intact; Nursing to continue with every dressing changes every other day reapplying adaptic to graft site on right and ulceration site on left. Refrain from use of tefla -Continue with Gabapentin 381m qhs  -Advised patient to continue with post-op shoe on right and left foot with felt padding -Advised patient to limit activity to necessity  -Advised patient to elevate as necessary  -Return 1 week for graft site checks. In the meantime, patient to call office if any issues or problems arise.  TLandis Martins DPM

## 2016-11-08 ENCOUNTER — Ambulatory Visit (INDEPENDENT_AMBULATORY_CARE_PROVIDER_SITE_OTHER): Payer: Medicare Other | Admitting: Sports Medicine

## 2016-11-08 ENCOUNTER — Encounter: Payer: Self-pay | Admitting: Sports Medicine

## 2016-11-08 DIAGNOSIS — E1142 Type 2 diabetes mellitus with diabetic polyneuropathy: Secondary | ICD-10-CM | POA: Diagnosis not present

## 2016-11-08 DIAGNOSIS — M79672 Pain in left foot: Secondary | ICD-10-CM

## 2016-11-08 DIAGNOSIS — M79671 Pain in right foot: Secondary | ICD-10-CM

## 2016-11-08 DIAGNOSIS — E11621 Type 2 diabetes mellitus with foot ulcer: Secondary | ICD-10-CM | POA: Diagnosis not present

## 2016-11-08 DIAGNOSIS — L97521 Non-pressure chronic ulcer of other part of left foot limited to breakdown of skin: Secondary | ICD-10-CM

## 2016-11-08 DIAGNOSIS — E11610 Type 2 diabetes mellitus with diabetic neuropathic arthropathy: Secondary | ICD-10-CM | POA: Diagnosis not present

## 2016-11-08 DIAGNOSIS — L97411 Non-pressure chronic ulcer of right heel and midfoot limited to breakdown of skin: Secondary | ICD-10-CM | POA: Diagnosis not present

## 2016-11-08 NOTE — Progress Notes (Signed)
Subjective: [name] is a 50 y.o. female patient seen today in office for follow up ulcer care bilateral. Patient is POV #10 (DOS 09-05-16), S/P Right foot wound debridement with placement of graft and for follow up eval of left foot ulcer.  Patient denies current pain at surgical site, denies calf pain, denies headache, chest pain, shortness of breath, nausea, vomiting, fever, or chills. Patient has home nursing every other day. No other issues noted.   Patient Active Problem List   Diagnosis Date Noted  . Cancer of sigmoid colon ([place]) 03/09/2015  . OSA (obstructive sleep apnea) 07/04/2014  . Migraine without aura, with intractable migraine, so stated, without mention of status migrainosus 03/26/2014  . Peripheral neuropathy ([place]) 03/03/2014  . Abnormal stress test 02/12/2014  . CVA (cerebral infarction) 02/12/2014  . Chest pain   . Abnormal heart rhythm   . Edema   . Hypertension   . Hyperlipemia   . Hypercholesterolemia   . Other and unspecified hyperlipidemia 11/14/2013  . Type II diabetes mellitus, uncontrolled ([place]) 07/22/2013  . Unspecified essential hypertension 07/22/2013  . 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 Diabetic polyneuropathy ([place]) 07/22/2013    Current Outpatient Prescriptions on File Prior to Visit  Medication Sig Dispense Refill  . amphetamine-dextroamphetamine (ADDERALL) 30 MG tablet Take 30 mg by mouth daily.   0  . atorvastatin (LIPITOR) 40 MG tablet Take 1 tablet (40 mg total) by mouth daily at 6 PM. 90 tablet 3  . BELSOMRA 20 MG TABS Take 1 tablet by mouth daily.    . canagliflozin (INVOKANA) 300 MG TABS tablet TAKE 300 MG BY MOUTH DAILY BEFORE BREAKFAST. (Patient taking differently: Take 300 mg by mouth daily before breakfast. TAKE 300 MG BY MOUTH DAILY BEFORE BREAKFAST.) 30 tablet 3  . CHANTIX 0.5 MG tablet Take 1 mg by mouth daily. Reported on 05/27/2016    . chlorproMAZINE  (THORAZINE) 200 MG tablet Take 400 mg by mouth at bedtime.     . diazepam (VALIUM) 10 MG tablet Take 20 mg by mouth at bedtime.     . docusate sodium (COLACE) 100 MG capsule Take 1 capsule (100 mg total) by mouth 2 (two) times daily. 10 capsule 0  . esomeprazole (NEXIUM) 40 MG capsule Take 40 mg by mouth daily.     . fenofibrate (TRICOR) 145 MG tablet TAKE 1 TABLET (145 MG TOTAL) BY MOUTH DAILY. 30 tablet 5  . gabapentin (NEURONTIN) 300 MG capsule Take 1 capsule (300 mg total) by mouth at bedtime. 90 capsule 3  . glucose blood (FREESTYLE LITE) test strip Use as instructed to check blood sugar 3 times per day dx code E11.65 100 each 5  . HYDROcodone-acetaminophen (NORCO) 10-325 MG tablet Take 1 tablet by mouth every 6 (six) hours as needed. 30 tablet 0  . insulin aspart (NOVOLOG) 100 UNIT/ML injection Use per v-go pump 76 units daily 30 mL 2  . Insulin Disposable Pump (V-GO 40) KIT Use 1 device per day. 1 kit 3  . Lancets (FREESTYLE) lancets Use as instructed to check blood sugar 3 times per day dx code E11.65 100 each 5  . metFORMIN (GLUCOPHAGE-XR) 500 MG 24 hr tablet Take 4 tablets (2,000 mg total) by mouth daily with supper. 120 tablet 3  . potassium chloride SA (K-DUR,KLOR-CON) 20 MEQ tablet TAKE (1) TABLET BY MOUTH EVERY DAY 30 tablet 10  . potassium chloride SA (K-DUR,KLOR-CON) 20 MEQ tablet TAKE (1)  TABLET BY MOUTH EVERY DAY 30 tablet 11  . promethazine (PHENERGAN) 12.5 MG tablet Take 1 tablet (12.5 mg total) by mouth every 6 (six) hours as needed for nausea or vomiting. 30 tablet 0  . rizatriptan (MAXALT) 10 MG tablet Take 1 tablet (10 mg total) by mouth as needed for migraine. May repeat in 2 hours if needed (Patient taking differently: Take 10 mg by mouth as needed for migraine. May repeat in 2 hours if needed) 10 tablet 3  . spironolactone (ALDACTONE) 100 MG tablet Take 1 tablet (100 mg total) by mouth daily. 90 tablet 3  . tamsulosin (FLOMAX) 0.4 MG CAPS capsule Take 0.4 mg by mouth  daily.    [name] tiZANidine (ZANAFLEX) 4 MG tablet Take 2 tablets by mouth at bedtime.    . TRULICITY 7.10 GY/6.9SW SOPN Inject 5 mLs as directed once a week. Thursdays    . Vortioxetine HBr (TRINTELLIX) 20 MG TABS Take 25 mg by mouth daily. Takes with a 5 mg tablet to equal 25 mg    . Vortioxetine HBr 5 MG TABS Take 25 mg by mouth daily. Takes with a 20 mg tablet to equal 25 mg     No current facility-administered medications on file prior to visit.     Allergies  Allergen Reactions  . Latex Itching, Rash and Other (See Comments)    Pt states she cannot use condoms - cause an infection.  Use of latex on skin is okay.  Tape causes rash  . Sweetening Enhancer [Flavoring Agent] Nausea And Vomiting and Other (See Comments)    HEADACHES  . Tetracyclines & Related Nausea And Vomiting and Other (See Comments)    ;YEAST INFECTIONS  . Aspartame And Phenylalanine Nausea And Vomiting    HEADACHES  . Ibuprofen Other (See Comments)    HEADACHES  . Trazodone And Nefazodone Other (See Comments)    Hallucinations   . Triazolam Other (See Comments)    HALLUCINATIONS    Objective: There were no vitals filed for this visit.  General: No acute distress, AAOx3  Right foot: Dorsal 5th toe healed with dry scab. At  Medial midfoot ulceration 1x0.5cm (smaller than previous)  with granular base with no surrounding infection, mild swelling right foot, no streaking erythema/cellulitis, no warmth, no drainage, no gross signs of infection noted, Capillary fill time <3 seconds in all digits, gross sensation present via light touch to right foot. Protective sensation absent. Right foot deformity significant for charcot. No pain or crepitation with range of motion right foot.  No pain with calf compression.   To left foot there is a partial thickness ulceration that measures 0.5x0.5cm (smaller than previous) with no other signs of infection. No other issues.  Assessment and Plan:  Problem List Items Addressed This  Visit    None    Visit Diagnoses    Diabetic ulcer of right midfoot associated with type 2 diabetes mellitus, limited to breakdown of skin ([place])    -  Primary   Foot ulceration, left, limited to breakdown of skin ([place])       Diabetic Charcot's foot ([place])       Right foot pain       Left foot pain       Diabetic polyneuropathy associated with type 2 diabetes mellitus ([place])         -Patient seen and evaluated -Debrided ulcerations site using tissue nipper and saline moistened guaze -[employer] matrix 3x7cm (lot [id number]) divided amongst 2 ulceration  sites on right and left foot with no waste secured with steri-strips covered with adaptic guaze to graft sites secured with dry sterile dressing secured with Coban and stockinet  -Advised patient to make sure to keep dressings clean, dry, and intact; Nursing to continue with every dressing changes every other day reapplying adaptic to graft site on right and ulceration site on left. Refrain from use of tefla -Continue with Gabapentin 365m qhs  -Advised patient to continue with post-op shoe on right and left foot with felt padding -Advised patient to limit activity to necessity  -Advised patient to elevate as necessary  -Return 1 week for graft site checks. In the meantime, patient to call office if any issues or problems arise.  TLandis Martins DPM

## 2016-11-11 NOTE — Telephone Encounter (Signed)
Ok Thanks. Patient already has an appointment to see me on Tuesday as well.

## 2016-11-14 ENCOUNTER — Ambulatory Visit (INDEPENDENT_AMBULATORY_CARE_PROVIDER_SITE_OTHER): Payer: Medicare Other

## 2016-11-14 ENCOUNTER — Ambulatory Visit (INDEPENDENT_AMBULATORY_CARE_PROVIDER_SITE_OTHER): Payer: Medicare Other | Admitting: Podiatry

## 2016-11-14 DIAGNOSIS — L97501 Non-pressure chronic ulcer of other part of unspecified foot limited to breakdown of skin: Secondary | ICD-10-CM | POA: Diagnosis not present

## 2016-11-14 DIAGNOSIS — M79672 Pain in left foot: Secondary | ICD-10-CM

## 2016-11-14 DIAGNOSIS — R52 Pain, unspecified: Secondary | ICD-10-CM

## 2016-11-14 DIAGNOSIS — M25572 Pain in left ankle and joints of left foot: Secondary | ICD-10-CM | POA: Diagnosis not present

## 2016-11-15 ENCOUNTER — Encounter: Payer: Self-pay | Admitting: Sports Medicine

## 2016-11-15 ENCOUNTER — Telehealth: Payer: Self-pay | Admitting: *Deleted

## 2016-11-15 ENCOUNTER — Ambulatory Visit (INDEPENDENT_AMBULATORY_CARE_PROVIDER_SITE_OTHER): Payer: Medicare Other | Admitting: Sports Medicine

## 2016-11-15 DIAGNOSIS — E11621 Type 2 diabetes mellitus with foot ulcer: Secondary | ICD-10-CM

## 2016-11-15 DIAGNOSIS — L97521 Non-pressure chronic ulcer of other part of left foot limited to breakdown of skin: Secondary | ICD-10-CM | POA: Diagnosis not present

## 2016-11-15 DIAGNOSIS — M79672 Pain in left foot: Secondary | ICD-10-CM

## 2016-11-15 DIAGNOSIS — L97411 Non-pressure chronic ulcer of right heel and midfoot limited to breakdown of skin: Secondary | ICD-10-CM

## 2016-11-15 DIAGNOSIS — M79671 Pain in right foot: Secondary | ICD-10-CM

## 2016-11-15 DIAGNOSIS — E11610 Type 2 diabetes mellitus with diabetic neuropathic arthropathy: Secondary | ICD-10-CM

## 2016-11-15 DIAGNOSIS — E1142 Type 2 diabetes mellitus with diabetic polyneuropathy: Secondary | ICD-10-CM

## 2016-11-15 NOTE — Telephone Encounter (Addendum)
-----   Message from Landis Martins, [place] sent at 11/15/2016  2:47 PM EST ----- Regarding: nursing wound care orders Right ulcer is scabbed over recommend antibiotic cream Left ulcer has graft in place apply nonadherant adaptic Cover all areas with 4x4, kerlix, and Coban -Dr. Cannon Kettle. Copy of above orders faxed to [facility].

## 2016-11-15 NOTE — Progress Notes (Signed)
Subjective: [name] is a 50 y.o. female patient seen today in office for follow up ulcer care bilateral. Patient is POV #11 (DOS 09-05-16), S/P Right foot wound debridement with placement of graft and for follow up eval of left foot ulcer.  Patient denies current pain at surgical site, states she had some pain on left and came in office yesterday for injection with no problems, denies calf pain, denies headache, chest pain, shortness of breath, nausea, vomiting, fever, or chills. Patient has home nursing every other day. No other issues noted.   Patient Active Problem List   Diagnosis Date Noted  . Cancer of sigmoid colon ([place]) 03/09/2015  . OSA (obstructive sleep apnea) 07/04/2014  . Migraine without aura, with intractable migraine, so stated, without mention of status migrainosus 03/26/2014  . Peripheral neuropathy ([place]) 03/03/2014  . Abnormal stress test 02/12/2014  . CVA (cerebral infarction) 02/12/2014  . Chest pain   . Abnormal heart rhythm   . Edema   . Hypertension   . Hyperlipemia   . Hypercholesterolemia   . Other and unspecified hyperlipidemia 11/14/2013  . Type II diabetes mellitus, uncontrolled ([place]) 07/22/2013  . Unspecified essential hypertension 07/22/2013  . 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 Diabetic polyneuropathy ([place]) 07/22/2013    Current Outpatient Prescriptions on File Prior to Visit  Medication Sig Dispense Refill  . amphetamine-dextroamphetamine (ADDERALL) 30 MG tablet Take 30 mg by mouth daily.   0  . atorvastatin (LIPITOR) 40 MG tablet Take 1 tablet (40 mg total) by mouth daily at 6 PM. 90 tablet 3  . BELSOMRA 20 MG TABS Take 1 tablet by mouth daily.    . canagliflozin (INVOKANA) 300 MG TABS tablet TAKE 300 MG BY MOUTH DAILY BEFORE BREAKFAST. (Patient taking differently: Take 300 mg by mouth daily before breakfast. TAKE 300 MG BY MOUTH DAILY BEFORE BREAKFAST.) 30 tablet 3  .  CHANTIX 0.5 MG tablet Take 1 mg by mouth daily. Reported on 05/27/2016    . chlorproMAZINE (THORAZINE) 200 MG tablet Take 400 mg by mouth at bedtime.     . diazepam (VALIUM) 10 MG tablet Take 20 mg by mouth at bedtime.     . docusate sodium (COLACE) 100 MG capsule Take 1 capsule (100 mg total) by mouth 2 (two) times daily. 10 capsule 0  . esomeprazole (NEXIUM) 40 MG capsule Take 40 mg by mouth daily.     . fenofibrate (TRICOR) 145 MG tablet TAKE 1 TABLET (145 MG TOTAL) BY MOUTH DAILY. 30 tablet 5  . gabapentin (NEURONTIN) 300 MG capsule Take 1 capsule (300 mg total) by mouth at bedtime. 90 capsule 3  . glucose blood (FREESTYLE LITE) test strip Use as instructed to check blood sugar 3 times per day dx code E11.65 100 each 5  . HYDROcodone-acetaminophen (NORCO) 10-325 MG tablet Take 1 tablet by mouth every 6 (six) hours as needed. 30 tablet 0  . insulin aspart (NOVOLOG) 100 UNIT/ML injection Use per v-go pump 76 units daily 30 mL 2  . Insulin Disposable Pump (V-GO 40) KIT Use 1 device per day. 1 kit 3  . Lancets (FREESTYLE) lancets Use as instructed to check blood sugar 3 times per day dx code E11.65 100 each 5  . metFORMIN (GLUCOPHAGE-XR) 500 MG 24 hr tablet Take 4 tablets (2,000 mg total) by mouth daily with supper. 120 tablet 3  . potassium chloride SA (K-DUR,KLOR-CON) 20 MEQ tablet TAKE (1) TABLET BY  MOUTH EVERY DAY 30 tablet 10  . potassium chloride SA (K-DUR,KLOR-CON) 20 MEQ tablet TAKE (1) TABLET BY MOUTH EVERY DAY 30 tablet 11  . promethazine (PHENERGAN) 12.5 MG tablet Take 1 tablet (12.5 mg total) by mouth every 6 (six) hours as needed for nausea or vomiting. 30 tablet 0  . rizatriptan (MAXALT) 10 MG tablet Take 1 tablet (10 mg total) by mouth as needed for migraine. May repeat in 2 hours if needed (Patient taking differently: Take 10 mg by mouth as needed for migraine. May repeat in 2 hours if needed) 10 tablet 3  . spironolactone (ALDACTONE) 100 MG tablet Take 1 tablet (100 mg total) by mouth  daily. 90 tablet 3  . tamsulosin (FLOMAX) 0.4 MG CAPS capsule Take 0.4 mg by mouth daily.    [name] tiZANidine (ZANAFLEX) 4 MG tablet Take 2 tablets by mouth at bedtime.    . TRULICITY 2.42 PN/3.6RW SOPN Inject 5 mLs as directed once a week. Thursdays    . Vortioxetine HBr (TRINTELLIX) 20 MG TABS Take 25 mg by mouth daily. Takes with a 5 mg tablet to equal 25 mg    . Vortioxetine HBr 5 MG TABS Take 25 mg by mouth daily. Takes with a 20 mg tablet to equal 25 mg     No current facility-administered medications on file prior to visit.     Allergies  Allergen Reactions  . Latex Itching, Rash and Other (See Comments)    Pt states she cannot use condoms - cause an infection.  Use of latex on skin is okay.  Tape causes rash  . Sweetening Enhancer [Flavoring Agent] Nausea And Vomiting and Other (See Comments)    HEADACHES  . Tetracyclines & Related Nausea And Vomiting and Other (See Comments)    ;YEAST INFECTIONS  . Aspartame And Phenylalanine Nausea And Vomiting    HEADACHES  . Ibuprofen Other (See Comments)    HEADACHES  . Trazodone And Nefazodone Other (See Comments)    Hallucinations   . Triazolam Other (See Comments)    HALLUCINATIONS    Objective: There were no vitals filed for this visit.  General: No acute distress, AAOx3  Right foot: Dorsal 5th toe healed with dry scab. At Medial midfoot ulceration prematurely healed with no surrounding infection, mild swelling right foot, no streaking erythema/cellulitis, no warmth, no drainage, no gross signs of infection noted, Capillary fill time <3 seconds in all digits, gross sensation present via light touch to right foot. Protective sensation absent. 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 No pain or crepitation with range of motion right foot.  No pain with calf compression.   To left foot there is a partial thickness ulceration that measures 0.4x0.5cm (smaller than previous) with no other signs of infection. No other  issues.  Assessment and Plan:  Problem List Items Addressed This Visit    None    Visit Diagnoses    Diabetic ulcer of right midfoot associated with type 2 diabetes mellitus, limited to breakdown of skin ([place])    -  Primary   Prematurely healed   Foot ulceration, left, limited to breakdown of skin ([place])       Diabetic Charcot's foot ([place])       Right foot pain       Left foot pain       Diabetic polyneuropathy associated with type 2 diabetes mellitus ([place])         -Patient seen and evaluated -Debrided ulceration site on left using  tissue nipper and saline moistened guaze -[employer] matrix 3x7cm (lot [id number]) using 50% of the wound matrix on the left foot secured with steri-strips covered with adaptic guaze to graft site on left and antibiotic cream on right secured with dry sterile dressing secured with Coban and stockinet  -Advised patient to make sure to keep dressings clean, dry, and intact; Nursing to continue with every dressing changes every other day reapplying adaptic to graft site on on left and antibiotic cream on right. Refrain from use of tefla -Continue with Gabapentin 329m qhs  -Advised patient to continue with post-op shoe on right and left foot with felt padding -Advised patient to limit activity to necessity  -Advised patient to elevate as necessary  -Return 1 week for continued wound care. In the meantime, patient to call office if any issues or problems arise.  TLandis Martins DPM

## 2016-11-22 ENCOUNTER — Encounter: Payer: Self-pay | Admitting: Sports Medicine

## 2016-11-22 ENCOUNTER — Ambulatory Visit (INDEPENDENT_AMBULATORY_CARE_PROVIDER_SITE_OTHER): Payer: Medicare Other | Admitting: Sports Medicine

## 2016-11-22 DIAGNOSIS — M79671 Pain in right foot: Secondary | ICD-10-CM

## 2016-11-22 DIAGNOSIS — E11610 Type 2 diabetes mellitus with diabetic neuropathic arthropathy: Secondary | ICD-10-CM | POA: Diagnosis not present

## 2016-11-22 DIAGNOSIS — E1142 Type 2 diabetes mellitus with diabetic polyneuropathy: Secondary | ICD-10-CM

## 2016-11-22 DIAGNOSIS — L97521 Non-pressure chronic ulcer of other part of left foot limited to breakdown of skin: Secondary | ICD-10-CM

## 2016-11-22 DIAGNOSIS — M79672 Pain in left foot: Secondary | ICD-10-CM

## 2016-11-22 MED ORDER — GABAPENTIN 300 MG PO CAPS: 600.0000 mg | ORAL_CAPSULE | Freq: Two times a day (BID) | ORAL | 3 refills | Status: DC

## 2016-11-22 NOTE — Progress Notes (Signed)
Subjective: [name] is a 50 y.o. female patient seen today in office for follow up ulcer care bilateral. Patient is POV #12 (DOS 09-05-16), S/P Right foot wound debridement with placement of graft and for follow up eval of left foot ulcer.  Patient denies current pain at surgical site, states she had some pain on left and is a little better after injection. Desires Gabapentin refill, denies calf pain, denies headache, chest pain, shortness of breath, nausea, vomiting, fever, or chills. Patient has home nursing every other day. No other issues noted.   Patient Active Problem List   Diagnosis Date Noted  . Cancer of sigmoid colon ([place]) 03/09/2015  . OSA (obstructive sleep apnea) 07/04/2014  . Migraine without aura, with intractable migraine, so stated, without mention of status migrainosus 03/26/2014  . Peripheral neuropathy ([place]) 03/03/2014  . Abnormal stress test 02/12/2014  . CVA (cerebral infarction) 02/12/2014  . Chest pain   . Abnormal heart rhythm   . Edema   . Hypertension   . Hyperlipemia   . Hypercholesterolemia   . Other and unspecified hyperlipidemia 11/14/2013  . Type II diabetes mellitus, uncontrolled ([place]) 07/22/2013  . Unspecified essential hypertension 07/22/2013  . 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 Diabetic polyneuropathy ([place]) 07/22/2013    Current Outpatient Prescriptions on File Prior to Visit  Medication Sig Dispense Refill  . amphetamine-dextroamphetamine (ADDERALL) 30 MG tablet Take 30 mg by mouth daily.   0  . atorvastatin (LIPITOR) 40 MG tablet Take 1 tablet (40 mg total) by mouth daily at 6 PM. 90 tablet 3  . BELSOMRA 20 MG TABS Take 1 tablet by mouth daily.    . canagliflozin (INVOKANA) 300 MG TABS tablet TAKE 300 MG BY MOUTH DAILY BEFORE BREAKFAST. (Patient taking differently: Take 300 mg by mouth daily before breakfast. TAKE 300 MG BY MOUTH DAILY BEFORE BREAKFAST.) 30  tablet 3  . CHANTIX 0.5 MG tablet Take 1 mg by mouth daily. Reported on 05/27/2016    . chlorproMAZINE (THORAZINE) 200 MG tablet Take 400 mg by mouth at bedtime.     . diazepam (VALIUM) 10 MG tablet Take 20 mg by mouth at bedtime.     . docusate sodium (COLACE) 100 MG capsule Take 1 capsule (100 mg total) by mouth 2 (two) times daily. 10 capsule 0  . esomeprazole (NEXIUM) 40 MG capsule Take 40 mg by mouth daily.     . fenofibrate (TRICOR) 145 MG tablet TAKE 1 TABLET (145 MG TOTAL) BY MOUTH DAILY. 30 tablet 5  . glucose blood (FREESTYLE LITE) test strip Use as instructed to check blood sugar 3 times per day dx code E11.65 100 each 5  . HYDROcodone-acetaminophen (NORCO) 10-325 MG tablet Take 1 tablet by mouth every 6 (six) hours as needed. 30 tablet 0  . insulin aspart (NOVOLOG) 100 UNIT/ML injection Use per v-go pump 76 units daily 30 mL 2  . Insulin Disposable Pump (V-GO 40) KIT Use 1 device per day. 1 kit 3  . Lancets (FREESTYLE) lancets Use as instructed to check blood sugar 3 times per day dx code E11.65 100 each 5  . metFORMIN (GLUCOPHAGE-XR) 500 MG 24 hr tablet Take 4 tablets (2,000 mg total) by mouth daily with supper. 120 tablet 3  . potassium chloride SA (K-DUR,KLOR-CON) 20 MEQ tablet TAKE (1) TABLET BY MOUTH EVERY DAY 30 tablet 10  . potassium chloride SA (K-DUR,KLOR-CON) 20 MEQ tablet TAKE (1) TABLET  BY MOUTH EVERY DAY 30 tablet 11  . promethazine (PHENERGAN) 12.5 MG tablet Take 1 tablet (12.5 mg total) by mouth every 6 (six) hours as needed for nausea or vomiting. 30 tablet 0  . rizatriptan (MAXALT) 10 MG tablet Take 1 tablet (10 mg total) by mouth as needed for migraine. May repeat in 2 hours if needed (Patient taking differently: Take 10 mg by mouth as needed for migraine. May repeat in 2 hours if needed) 10 tablet 3  . spironolactone (ALDACTONE) 100 MG tablet Take 1 tablet (100 mg total) by mouth daily. 90 tablet 3  . tamsulosin (FLOMAX) 0.4 MG CAPS capsule Take 0.4 mg by mouth daily.     [name] tiZANidine (ZANAFLEX) 4 MG tablet Take 2 tablets by mouth at bedtime.    . TRULICITY 1.61 WR/6.0AV SOPN Inject 5 mLs as directed once a week. Thursdays    . Vortioxetine HBr (TRINTELLIX) 20 MG TABS Take 25 mg by mouth daily. Takes with a 5 mg tablet to equal 25 mg    . Vortioxetine HBr 5 MG TABS Take 25 mg by mouth daily. Takes with a 20 mg tablet to equal 25 mg     No current facility-administered medications on file prior to visit.     Allergies  Allergen Reactions  . Latex Itching, Rash and Other (See Comments)    Pt states she cannot use condoms - cause an infection.  Use of latex on skin is okay.  Tape causes rash  . Sweetening Enhancer [Flavoring Agent] Nausea And Vomiting and Other (See Comments)    HEADACHES  . Tetracyclines & Related Nausea And Vomiting and Other (See Comments)    ;YEAST INFECTIONS  . Aspartame And Phenylalanine Nausea And Vomiting    HEADACHES  . Ibuprofen Other (See Comments)    HEADACHES  . Trazodone And Nefazodone Other (See Comments)    Hallucinations   . Triazolam Other (See Comments)    HALLUCINATIONS    Objective: There were no vitals filed for this visit.  General: No acute distress, AAOx3  Right foot: Dorsal 5th toe healed with dry scab. At Medial midfoot ulceration healed with no surrounding infection, mild swelling right foot, no streaking erythema/cellulitis, no warmth, no drainage, no gross signs of infection noted, Capillary fill time <3 seconds in all digits, gross sensation present via light touch to right foot. Protective sensation absent. 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 No pain or crepitation with range of motion right foot.  No pain with calf compression.   To left foot there is a partial thickness ulceration that measures 0.4x0.5cm (same as previous) with no other signs of infection. No other issues.  Assessment and Plan:  Problem List Items Addressed This Visit      Endocrine   Diabetic polyneuropathy  ([place])   Relevant Medications   gabapentin (NEURONTIN) 300 MG capsule    Other Visit Diagnoses    Foot ulceration, left, limited to breakdown of skin ([place])    -  Primary   Diabetic Charcot's foot ([place])       Right foot pain       Left foot pain         -Patient seen and evaluated -Debrided ulceration site on left using tissue nipper and saline moistened guaze -[employer] matrix 3x7cm (lot [id number]) using 50% of the wound matrix on the left foot secured with steri-strips covered with adaptic guaze to graft site on left and antibiotic cream on right secured with  dry sterile dressing secured with Coban and stockinet  -Advised patient to make sure to keep dressings clean, dry, and intact; Nursing to continue with every dressing changes every other day reapplying adaptic to graft site on on left and antibiotic cream on right. Refrain from use of tefla -Continue with Gabapentin 34m qhs; refill providede -Advised patient to continue with post-op shoe on right and left foot with felt padding; TO ORDER CUSTOM SHOES AND INSERTS AT NEXT VISIT -Advised patient to limit activity to necessity  -Advised patient to elevate as necessary  -Return 1 week for continued wound care. In the meantime, patient to call office if any issues or problems arise.  TLandis Martins DPM

## 2016-11-29 ENCOUNTER — Ambulatory Visit (INDEPENDENT_AMBULATORY_CARE_PROVIDER_SITE_OTHER): Payer: Medicare Other | Admitting: Podiatry

## 2016-11-29 ENCOUNTER — Encounter: Payer: Self-pay | Admitting: Podiatry

## 2016-11-29 VITALS — BP 124/81 | HR 93

## 2016-11-29 DIAGNOSIS — E11610 Type 2 diabetes mellitus with diabetic neuropathic arthropathy: Secondary | ICD-10-CM

## 2016-11-29 DIAGNOSIS — L97521 Non-pressure chronic ulcer of other part of left foot limited to breakdown of skin: Secondary | ICD-10-CM | POA: Diagnosis not present

## 2016-11-29 NOTE — Progress Notes (Signed)
This patient presents the office for follow-up for ulcer care of ulcers bilaterally evaluation and examination of her right foot reveals healing noted at the medial aspect of the midfoot at the level of the Charcot foot is still persistent ulcer noted sub-1 of the left foot. No evidence of any redness, swelling or infection. She returns the office to see Dr. Cannon Kettle who was called away for an emergency. Therefore, I saw her and allowed her nurse Anderson Malta to proceed with continued treatment of both of her feet  The neurovascular status is the same as previous visits. Examination of right foot reveals normal. Healing skin noted at the medial aspect of the right foot at the level of the Charcot foot. Her left foot does have a ulceration of approximately 4 x 4 mm with callus tissue noted around the center ulcer. No evidence of any redness, swelling or infection   Diagnosis  Healing ulcer right foot  [place] tr-later was applied by Anderson Malta and DSD was applied both feet. Since Dr. Cannon Kettle is now present. We will hold off ordering her custom shoes and inserts today and have it done next visit   Gardiner Barefoot DPM

## 2016-12-01 NOTE — Progress Notes (Signed)
[place]  [phone] [phone]  Clinic follow up Note   Patient Care Team: Tamsen Roers, MD as PCP - General (Family Medicine) Elayne Snare, MD as Consulting Physician (Endocrinology) Erroll Luna, MD as Consulting Physician (General Surgery) Truitt Merle, MD as Consulting Physician (Hematology) Arta Silence, MD as Consulting Physician (Gastroenterology) 12/03/2016  CHIEF COMPLAINTS:  Follow up stage I colon cancer   Oncology History   Colon cancer   Staging form: Colon and Rectum, AJCC 7th Edition     Pathologic stage from 05/16/2015: Stage I (T1, N0, cM0) - Signed by Truitt Merle, MD on 06/08/2015       Cancer of sigmoid colon ([place])   02/26/2015 Initial Diagnosis    Sigmoid Colon cancer      02/26/2015 Procedure    Colonoscopy showed internal hemorrhoids, 2 polyps in the transverse and ileocecal valve. There is a ulcerated polypoid lesion in the sigmoid colon which was biopsied.      03/02/2015 Tumor Marker    CEA= 2.5      03/06/2015 Imaging    CT chest, abdomen and pelvis with contrast showed no evidence of metastatic disease.      04/22/2015 Surgery    Sigmoid colon segmental resection, showed grade 2 adenocarcinoma, T1, 1.3 cm, 10 lymph nodes were negative, no lymphovascular invasion, no perineural invasion.        HISTORY OF PRESENTING ILLNESS:  [name] 50 y.o. female with the multiple medical comorbidities, including diabetes, hypertension, depression and stroke in 2015, is here because of recently diagnosed colon cancer.  She has chronic constipation, no nausea, abdominal pain or diarrhea. She denies melena or hematochezia. She was referred to see GI Dr. Paulita Fujita, and underwent a colonoscopy on 02/26/2015. The colonoscopy showed internal hemorrhoids, 2 polyps in the transverse and ileocecal valve. A ulcerated polypoid lesion in the sigmoid colon was seen and biopsied, which showed adenocarcinoma. She was referred to  surgeon  Dr. Brantley Stage, and underwent laparoscopic sigmoid colon segmental resection on 04/22/2015.  She has recovered well from surgery. She was discharged home after surgery. She had ED visit on 05/04/2015 for wound dehiscence after staple removal. She has been changing the dressing every other day at home. She has appointment to see Dr. Brantley Stage this abdomen.  She denies any significant pain, BM is normal. No other new complains. She has diabetic neuropathy in hands and feet, which is stable. She lives alone, does not have much social support. She appears to be depressed with a very flat facial expression, but she denies suicidal ideas.  CURRENT THERAPY: Observation  INTERIM HISTORY  Perlie returns for follow-up.   She is doing alright. Both feet wrapped because of wounds. She states these wounds are secondary to diabetes. She is following at [facility] and follows there once weekly. Her last visit was Tuesday. She has not received an antibiotic.   She reports fever of 100 F, with a usual temperature of 97 F. She was not aware she had a fever until she came in today and had her temperature checked. She reports a slight headache. She has a smoker's cough with sputum production that is sometime dark brown in color. She denies any bloating or bowel issues.  MEDICAL HISTORY:  Past Medical History:  Diagnosis Date  . Anginal pain ([place])   . Anxiety   . Arthritis    "qwhere" (02/12/2014)  . Bipolar disorder ([place])   . Cancer [facility])    colon cancer  .  Chest pain    a. 2008 Cath: normal cors;  b. 12/2013 [facility] CL: EF 47%, no ischemia/infarct.  . Cholecystitis   . Chronic back pain   . Chronic pain   . Claudication ([place])    a. 12/2013 ABI's: R 0.97, L 0.94.  [name] COPD (chronic obstructive pulmonary disease) ([place])   . Depression   . Diabetic foot ulcer ([facility])    chronic  . DJD (degenerative joint disease)   . Edema   . Gastroparesis   . GERD (gastroesophageal reflux disease)   . H/O hiatal hernia   .  WSFKCLEX(517.0)    "weekly" (02/12/2014)  . History of blood transfusion 1986   "related to lost a child" (02/12/2014)  . Hypercholesterolemia   . Hyperlipemia   . Hypersomnia   . Hypertension   . Insomnia   . MRSA (methicillin resistant Staphylococcus aureus)   . Neurogenic bladder   . OSA (obstructive sleep apnea) 07/04/2014  . Palpitations   . Personality disorder   . Pneumonia    "twice, I think" (02/12/2014)  . Sleep apnea    didn't tolerate cpap (02/12/2014)  . SOB (shortness of breath) on exertion   . Stroke ([place]) 02/12/2014   "eyesight is messed up; not steady on my feet" (02/12/2014)  . SUI (stress urinary incontinence, female) S/P SLING 12-29-2011  . Tobacco abuse   . Type II diabetes mellitus ([place])   . Ulcer ([place])   . Vertigo     SURGICAL HISTORY: Past Surgical History:  Procedure Laterality Date  . ABDOMINAL HYSTERECTOMY    . ANTERIOR CERVICAL DECOMP/DISCECTOMY FUSION  2000   C6 - 7  . CARDIAC CATHETERIZATION  05-22-2008   DR SKAINS   NO SIGNIFECANT CAD/ NORMAL LV/  EF 65%/  NO WALL MOTION ABNORMALITIES  . CARDIAC CATHETERIZATION  02/12/2014  . CARPAL TUNNEL RELEASE Right 04-25-2013  . [place]; 1992  . CYSTO N/A 04/30/2013   Procedure: CYSTOSCOPY;  Surgeon: Reece Packer, MD;  Location: [facility];  Service: Urology;  Laterality: N/A;  . CYSTOSCOPY WITH INJECTION  05/04/2012   Procedure: CYSTOSCOPY WITH INJECTION;  Surgeon: Reece Packer, MD;  Location: [place];  Service: Urology;  Laterality: N/A;  MACROPLASTIQUE INJECTION  . CYSTOSCOPY WITH INJECTION  08/28/2012   Procedure: CYSTOSCOPY WITH INJECTION;  Surgeon: Reece Packer, MD;  Location: [facility];  Service: Urology;  Laterality: N/A;  cysto and macroplastique   . FOOT SURGERY Bilateral   . HERNIA REPAIR  ?1996   "stomach"  . KNEE ARTHROSCOPY W/ ALLOGRAFT IMPANT Left    graft x 2  . KNEE SURGERY     TOTAL 8 SURG'S  . LAPAROSCOPIC SIGMOID COLECTOMY   04/22/2015  . LAPAROSCOPIC SIGMOID COLECTOMY N/A 04/22/2015   Procedure: LAPAROSCOPIC HAND ASSISTED SIGMOID COLECTOMY;  Surgeon: Erroll Luna, MD;  Location: [place];  Service: General;  Laterality: N/A;  . LEFT HEART CATHETERIZATION WITH CORONARY ANGIOGRAM N/A 02/12/2014   Procedure: LEFT HEART CATHETERIZATION WITH CORONARY ANGIOGRAM;  Surgeon: Candee Furbish, MD;  Location: [facility] C[facility];  Service: Cardiovascular;  Laterality: N/A;  . LUMBAR FUSION     "cage in my spine"  . MULTIPLE LAPAROSCOPIES FOR ENDOMETRIOSIS    . PUBOVAGINAL SLING  12/29/2011   Procedure: Gaynelle Arabian;  Surgeon: Reece Packer, MD;  Location: [facility];  Service: Urology;  Laterality: N/A;  cysto and sparc sling   . PUBOVAGINAL SLING N/A 04/30/2013   Procedure: REMOVAL  OF VAGINAL MESH;  Surgeon: Reece Packer, MD;  Location: [facility];  Service: Urology;  Laterality: N/A;  . RECONSTURCTION OF CONGENITAL UTERUS ANOMALY  1983  . REPEAT RECONSTRUCTION ACL LEFT KNEE/ SCREWS REMOVED  03-28-2000   CADAVER GRAFT  . TOTAL ABDOMINAL HYSTERECTOMY W/ BILATERAL SALPINGOOPHORECTOMY  1997  . WOUND DEBRIDEMENT Right 09/05/2016   Procedure: DEBRIDEMENT WOUND WITH GRAFT RIGHT FOOT;  Surgeon: Landis Martins, DPM;  Location: [place];  Service: Podiatry;  Laterality: Right;    SOCIAL HISTORY: Social History   Social History  . Marital status: Divorced    Spouse name: N/A  . Number of children: 2  . Years of education: 6th   Occupational History  . Not on file.   Social History Main Topics  . Smoking status: Current Every Day Smoker    Packs/day: 0.25    Years: 41.00    Types: Cigarettes, E-cigarettes  . Smokeless tobacco: Never Used  . Alcohol use No     Comment: denies  . Drug use: No  . Sexual activity: Yes   Other Topics Concern  . Not on file   Social History Narrative  Patient is divorced and lives alone- independent in ADLs, Drives   Patient has two adult children- grown son and daughter who is special needs in a home   Patient is disabled since 75   Patient has a 6th grade education.   Patient is right-handed.   Depression-medication and therapist   Patient drinks 2- 3 liters of soda and drinks tea but not everyday.    Does not routinely exercise.  [place]  FAMILY HISTORY: Family History  Problem Relation Age of Onset  . Hypertension Mother   . Diabetes Mother   . Cancer - Other Mother     lymphoma   . Cancer - Other Father     lung, bladder cancer   . Heart attack Father   . Cancer - Other Brother     bladder cancer     ALLERGIES:  is allergic to latex; sweetening enhancer [flavoring agent]; tetracyclines & related; aspartame and phenylalanine; ibuprofen; trazodone and nefazodone; and triazolam.  MEDICATIONS:  Current Outpatient Prescriptions  Medication Sig Dispense Refill  . atorvastatin (LIPITOR) 40 MG tablet Take 1 tablet (40 mg total) by mouth daily at 6 PM. 90 tablet 3  . BELSOMRA 20 MG TABS Take 1 tablet by mouth daily.    . canagliflozin (INVOKANA) 300 MG TABS tablet TAKE 300 MG BY MOUTH DAILY BEFORE BREAKFAST. (Patient taking differently: Take 300 mg by mouth daily before breakfast. TAKE 300 MG BY MOUTH DAILY BEFORE BREAKFAST.) 30 tablet 3  . CHANTIX 0.5 MG tablet Take 1 mg by mouth  daily. Reported on 05/27/2016    . chlorproMAZINE (THORAZINE) 200 MG tablet Take 400 mg by mouth at bedtime.     . diazepam (VALIUM) 10 MG tablet Take 20 mg by mouth at bedtime.     [name] esomeprazole (NEXIUM) 40 MG capsule Take 40 mg by mouth daily.     [name] gabapentin (NEURONTIN) 300 MG capsule Take 2 capsules (600 mg total) by mouth 2 (two) times daily. 90 capsule 3  . glucose blood (FREESTYLE LITE) test strip Use as instructed to check blood sugar 3 times per day dx code E11.65 100 each 5  . HYDROcodone-acetaminophen (NORCO) 10-325 MG tablet Take 1 tablet by mouth every 6 (six) hours as needed. 30 tablet 0  . insulin aspart (NOVOLOG) 100 UNIT/ML injection Use per v-go pump 76 units daily 30 mL 2  . Insulin Disposable Pump (V-GO 40) KIT Use 1 device per day. 1 kit 3  . Lancets (FREESTYLE) lancets Use as instructed to check blood sugar 3 times per day dx code E11.65 100 each 5  . metFORMIN (GLUCOPHAGE-XR) 500 MG 24 hr tablet Take 4 tablets (2,000 mg total) by mouth daily with supper. 120 tablet 3  . spironolactone (ALDACTONE) 100 MG tablet Take 1 tablet (100 mg total) by mouth daily. 90 tablet 3  . tamsulosin (FLOMAX) 0.4 MG CAPS capsule Take 0.4 mg by mouth daily.    [name] tiZANidine (ZANAFLEX) 4 MG tablet Take 2 tablets by mouth at bedtime.    . Vortioxetine HBr 5 MG TABS Take 25 mg by mouth daily. Takes with a 20 mg tablet to equal 25 mg    . amoxicillin-clavulanate (AUGMENTIN) 875-125 MG tablet Take 1 tablet by mouth 2 (two) times daily. 14 tablet 0  . rizatriptan (MAXALT) 10 MG tablet Take 1 tablet (10 mg total) by mouth as needed for migraine. May repeat in 2 hours if needed (Patient not taking: Reported on 12/02/2016) 10 tablet 3  No current facility-administered medications for this visit.     REVIEW OF SYSTEMS:   Constitutional: Denies fevers, chills or abnormal night sweats Eyes: Denies blurriness of vision, double vision or watery eyes Ears, nose, mouth, throat, and face: Denies mucositis or  sore throat (+) slight headache Respiratory: Denies cough, dyspnea or wheezes Cardiovascular: Denies palpitation, chest discomfort or lower extremity swelling Gastrointestinal:  Denies nausea, heartburn or change in bowel habits Skin: Denies abnormal skin rashes (+) bilateral foot wounds Lymphatics: Denies new lymphadenopathy or easy bruising Neurological: Denies numbness, tingling or new weaknesses Behavioral/Psych: Mood is stable, no new changes  All other systems were reviewed with the patient and are negative.  PHYSICAL EXAMINATION: ECOG PERFORMANCE STATUS: 1 - Symptomatic but completely ambulatory  Vitals:   12/02/16 1458  BP: (!) 124/56  Pulse: (!) 103  Resp: 18  Temp: 100 F (37.8 C)   Filed Weights   12/02/16 1458  Weight: 187 lb 3.2 oz (84.9 kg)    GENERAL:alert, no distress and comfortable. Flat affect.  SKIN: skin color, texture, turgor are normal, no rashes or significant lesions except her wound at bilateral feet, they are wrapped and pt prefers me not to open it  EYES: normal, conjunctiva are pink and non-injected, sclera clear OROPHARYNX:no exudate, no erythema and lips, buccal mucosa, and tongue normal  NECK: supple, thyroid normal size, non-tender, without nodularity LYMPH:  no palpable lymphadenopathy in the cervical, axillary or inguinal LUNGS: clear to auscultation and percussion with normal breathing effort HEART: regular rate & rhythm and no murmurs and no lower extremity edema ABDOMEN:abdomen soft, (+) insulin pump on abdominal wall. Non-tender and normal bowel sounds Musculoskeletal:no cyanosis of digits and no clubbing (+) bilateral foot wrapping PSYCH: alert & oriented x 3 with fluent speech NEURO: no focal motor/sensory deficits  LABORATORY DATA:  I have reviewed the data as listed CBC Latest Ref Rng & Units 12/02/2016 09/05/2016 07/29/2016  WBC 3.9 - 10.3 10e3/uL 20.0(H) 11.9(H) 12.7(H)  Hemoglobin 11.6 - 15.9 g/dL 13.1 13.0 12.2  Hematocrit 34.8 -  46.6 % 37.6 39.8 36.8  Platelets 145 - 400 10e3/uL 223 239 239    CMP Latest Ref Rng & Units 12/02/2016 10/19/2016 09/20/2016  Glucose 70 - 140 mg/dl 213(H) 353(H) 233(H)  BUN 7.0 - 26.0 mg/dL 5.3(L) 6 8  Creatinine 0.6 - 1.1 mg/dL 0.8 0.76 0.75  Sodium 136 - 145 mEq/L 134(L) 135 138  Potassium 3.5 - 5.1 mEq/L 3.7 3.4(L) 3.6  Chloride 96 - 112 mEq/L - 97 98  CO2 22 - 29 mEq/L _0 Calcium 8.4 - 10.4 mg/dL 9.2 9.2 9.1  Total Protein 6.4 - 8.3 g/dL 6.7 6.8 -  Total Bilirubin 0.20 - 1.20 mg/dL 0.54 0.3 -  Alkaline Phos 40 - 150 U/L 96 88 -  AST 5 - 34 U/L 8 11 -  ALT 0 - 55 U/L 12 14 -   CEA  Results for BARBI, KUMAGAI (MRN [id number]) as of 12/02/2016 15:20  Ref. Range 12/01/2015 15:14 03/31/2016 14:35 07/29/2016 14:49  CEA Latest Units: ng/mL 2.1 2.9 (H)   CEA Latest Ref Range: 0.0 - 4.7 ng/mL  3.4 2.8  CEA ([facility]) Latest Ref Range: 0.00 - 5.00 ng/mL   2.87   PATHOLOGY REPORT Diagnosis 04/22/2015 1. Colon, segmental resection for tumor, Sigmoid - COLONIC ADENOCARCINOMA EXTENDING INTO SUBMUCOSA, 1.3 CM - MARGINS NOT INVOLVED. - NINE BENIGN LYMPH NODES (0/9). 2. Colon, segmental resection, Sigmoid - FOCAL MESENTERIC FAT NECROSIS WITH HEMORRHAGE  AND FIBROSIS. - NO EVIDENCE OF MALIGNANCY. - ONE BENIGN LYMPH NODE (0/1). 3. Colon, resection margin (donut), Proximal donut - BENIGN COLON TISSUE. - NO EVIDENCE OF MALIGNANCY. 4. Colon, resection margin (donut), Distal donut - BENIGN COLON TISSUE. - NO EVIDENCE OF MALIGNANCY.  Microscopic Comment 1. COLON AND RECTUM (INCLUDING TRANS-ANAL RESECTION): Specimen: Sigmoid colon with proximal and distal donut margins. Procedure: Segmental resection with proximal and distal donut margins. Tumor site: Sigmoid Specimen integrity: Previously opened Macroscopic intactness of mesorectum: Incomplete Macroscopic tumor perforation: No Invasive tumor: Maximum size: 1.3 cm Histologic type(s): Colonic adenocarcinoma Histologic grade and  differentiation: G2: moderately differentiated/low grade Type of polyp in which invasive carcinoma arose: No residual polyp Microscopic extension of invasive tumor: Into submucosa Lymph-Vascular invasion: N Peri-neural invasion: No Tumor deposit(s) (discontinuous extramural extension): No Resection margins: Proximal margin: Free of tumor Distal margin: Free of tumor Circumferential (radial) (posterior ascending, posterior descending; lateral and posterior mid-rectum; and entire lower 1/3 rectum): N/A Mesenteric margin (sigmoid and transverse): Free of tumor Distance closest margin (if all above margins negative): N/A Treatment effect (neo-adjuvant therapy): No Additional polyp(s): No Non-neoplastic findings: Focal mesenteric adipose tissue fat necrosis with hemorrhage and fibrosis. Lymph nodes: number examined 10; number positive: 0 Pathologic Staging: pT1, pN0, pMX Ancillary studies: Pending. (JDP:kh 4 -28-16)  RADIOGRAPHIC STUDIES: I have personally reviewed the radiological images as listed and agreed with the findings in the report.  CT chest, abdomen and pelvis w contrast on 05/06/2016 IMPRESSION: 1. Stable exam. No evidence for metastatic disease in the chest, abdomen, or pelvis. 2. Suture line in the mid sigmoid colon, compatible with partial colectomy. 3. Centrilobular emphysema. 4. Steatosis.  ASSESSMENT & PLAN: 50 year old Caucasian female, with multiple medical comorbidities, who was found to have a stage I sigmoid colon cancer.  1. Stage I sigmoid colon adenocarcinoma, pT1N0M0, moderately differentiated, MMR normall  -I previously reviewed her surgical pathology findings with patient in great details. She appears to have very early stage colon cancer, no lymphovascular invasion, no perineural invasion or other high risk features. Her surgical margins were negative.  -We previously discussed that her colon cancer is likely cured by complete surgical resection, however  she does carry a small risk of cancer recurrence after surgery. -There is no role of adjuvant chemotherapy for stage I colon cancer -Her tumor is MMR normal, she does not have significant family history of colon or endometrial cancer, unlikely Lynch syndrome. -We discussed the surveillance plan. I recommend lab tests including CEA and physical exam every 3-4 months for the first two year, then every 6-12 month for additional 3 years, colonoscopy and CT scan at one year.  - her surveillance scan from May 2017 showed no evidence of recurrence. Per patient, she has had colonoscopy this year by Dr. Paulita Fujita, which was negative. -Lab reviewed, CBC and CMP normal, except hyperglycemia and elevated WBC. -Her CEA from today was normal. - no evidence of recurrence so far. - We'll continue surveillance.  2. Depression and coping -She appears to be depressed, has very flat facial expression, she states this is her normal status and she denies suicidal ideas.  3. Poorly controlled diabetes and hypertension, history of stroke, COPD - She is now on insulin pump. She will follow-up with her endocrinologist -I encourage her to follow-up with her primary care physician closely   4. Smoking cessation -We previously had discussion about smoking cessation. She is still smoking 1.5 pack a day. -She is willing to cut back and quit, I strongly  encouraged her to do so. She is not interested in smoking cessation program.  5. Fever and bilateral foot wounds secondary to diabetes -she is following at [facility] with Dr. Cannon Kettle and follows there once weekly. Her last visit was on Tuesday. -WBC is elevated at 20.0 and fever today. This is concerning for infection. I have called [facility] today and spoke with Dr. Cannon Kettle who agrees with a course of antibiotics to take over the weekend, I called in augmentin.  -she will see Dr. Cannon Kettle early next week   Plan - I'll see her back in 6 months with lab. She will  have CT scan prior to this visit. I ordered these scans today. -I called in Augmentin for her    All questions were answered. The patient knows to call the clinic with any problems, questions or concerns.  I spent 20 minutes counseling the patient face to face. The total time spent in the appointment was 25 minutes and more than 50% was on counseling.  This document serves as a record of services personally performed by Truitt Merle, MD. It was created on her behalf by Arlyce Harman, a trained medical scribe. The creation of this record is based on the scribe's personal observations and the provider's statements to them. This document has been checked and approved by the attending provider.    Truitt Merle, MD 12/03/2016 7:10 PM

## 2016-12-02 ENCOUNTER — Telehealth: Payer: Self-pay | Admitting: Sports Medicine

## 2016-12-02 ENCOUNTER — Ambulatory Visit (HOSPITAL_BASED_OUTPATIENT_CLINIC_OR_DEPARTMENT_OTHER): Payer: Medicare Other | Admitting: Hematology

## 2016-12-02 ENCOUNTER — Other Ambulatory Visit (HOSPITAL_BASED_OUTPATIENT_CLINIC_OR_DEPARTMENT_OTHER): Payer: Medicare Other

## 2016-12-02 ENCOUNTER — Telehealth: Payer: Self-pay | Admitting: Hematology

## 2016-12-02 VITALS — BP 124/56 | HR 103 | Temp 100.0°F | Resp 18 | Ht 65.0 in | Wt 187.2 lb

## 2016-12-02 DIAGNOSIS — E1169 Type 2 diabetes mellitus with other specified complication: Secondary | ICD-10-CM | POA: Diagnosis not present

## 2016-12-02 DIAGNOSIS — E1165 Type 2 diabetes mellitus with hyperglycemia: Secondary | ICD-10-CM | POA: Diagnosis not present

## 2016-12-02 DIAGNOSIS — R509 Fever, unspecified: Secondary | ICD-10-CM

## 2016-12-02 DIAGNOSIS — C187 Malignant neoplasm of sigmoid colon: Secondary | ICD-10-CM | POA: Diagnosis present

## 2016-12-02 DIAGNOSIS — I10 Essential (primary) hypertension: Secondary | ICD-10-CM

## 2016-12-02 DIAGNOSIS — IMO0002 Reserved for concepts with insufficient information to code with codable children: Secondary | ICD-10-CM

## 2016-12-02 DIAGNOSIS — Z794 Long term (current) use of insulin: Secondary | ICD-10-CM

## 2016-12-02 LAB — CBC WITH DIFFERENTIAL/PLATELET
BASO%: 0.2 % (ref 0.0–2.0)
Basophils Absolute: 0 10*3/uL (ref 0.0–0.1)
EOS%: 0 % (ref 0.0–7.0)
Eosinophils Absolute: 0 10*3/uL (ref 0.0–0.5)
HCT: 37.6 % (ref 34.8–46.6)
HGB: 13.1 g/dL (ref 11.6–15.9)
LYMPH%: 7.5 % — ABNORMAL LOW (ref 14.0–49.7)
MCH: 30.4 pg (ref 25.1–34.0)
MCHC: 34.8 g/dL (ref 31.5–36.0)
MCV: 87.2 fL (ref 79.5–101.0)
MONO#: 0.8 10*3/uL (ref 0.1–0.9)
MONO%: 4.2 % (ref 0.0–14.0)
NEUT#: 17.6 10*3/uL — ABNORMAL HIGH (ref 1.5–6.5)
NEUT%: 88.1 % — ABNORMAL HIGH (ref 38.4–76.8)
Platelets: 223 10*3/uL (ref 145–400)
RBC: 4.31 10*6/uL (ref 3.70–5.45)
RDW: 13.5 % (ref 11.2–14.5)
WBC: 20 10*3/uL — ABNORMAL HIGH (ref 3.9–10.3)
lymph#: 1.5 10*3/uL (ref 0.9–3.3)
nRBC: 0 % (ref 0–0)

## 2016-12-02 LAB — COMPREHENSIVE METABOLIC PANEL
ALT: 12 U/L (ref 0–55)
AST: 8 U/L (ref 5–34)
Albumin: 3.3 g/dL — ABNORMAL LOW (ref 3.5–5.0)
Alkaline Phosphatase: 96 U/L (ref 40–150)
Anion Gap: 11 mEq/L (ref 3–11)
BUN: 5.3 mg/dL — ABNORMAL LOW (ref 7.0–26.0)
CO2: 23 mEq/L (ref 22–29)
Calcium: 9.2 mg/dL (ref 8.4–10.4)
Chloride: 100 mEq/L (ref 98–109)
Creatinine: 0.8 mg/dL (ref 0.6–1.1)
EGFR: 84 mL/min/{1.73_m2} — ABNORMAL LOW (ref >90)
Glucose: 213 mg/dl — ABNORMAL HIGH (ref 70–140)
Potassium: 3.7 mEq/L (ref 3.5–5.1)
Sodium: 134 mEq/L — ABNORMAL LOW (ref 136–145)
Total Bilirubin: 0.54 mg/dL (ref 0.20–1.20)
Total Protein: 6.7 g/dL (ref 6.4–8.3)

## 2016-12-02 MED ORDER — AMOXICILLIN-POT CLAVULANATE 875-125 MG PO TABS: 1.0000 | ORAL_TABLET | Freq: Two times a day (BID) | ORAL | 0 refills | Status: DC

## 2016-12-02 NOTE — Telephone Encounter (Signed)
I spoke with Dr. [name] who informed me that she is treating patient for colon cancer and CBC today WBC of 20,000. Patient did not let her take off dressings because per patient the wounds are drying up and she wasn't sure if she could replace them correctly. Dr. [name] started patient on Augmentin and advised patient to see medical attention if symptoms are present. I thanked Dr. [name] for her call and told her I would monitor the patient's ulcerations closely. -Dr. Cannon Kettle

## 2016-12-02 NOTE — Telephone Encounter (Signed)
2 bottles of contrast given to patient, per 12/02/16 los. Appointments scheduled per 12/02/16 los. A copy of the AVS report and appointment schedule was given to the patient, per 12/02/16 los.[name] [name]

## 2016-12-03 ENCOUNTER — Encounter: Payer: Self-pay | Admitting: Hematology

## 2016-12-03 LAB — CEA: CEA1: 2.6 ng/mL (ref 0.0–4.7)

## 2016-12-04 MED ORDER — BETAMETHASONE SOD PHOS & ACET 6 (3-3) MG/ML IJ SUSP: 3.0000 mg | Freq: Once | INTRAMUSCULAR | Status: DC

## 2016-12-04 NOTE — Progress Notes (Signed)
Subjective:  Patient presents today with a new complaint of severe pain and tenderness to the left foot. Patient states the pain is so bad she could not wait for her follow-up appointment with Dr. Cannon Kettle scheduled for 11/15/2016. Patient presents today for further treatment and evaluation    Objective/Physical Exam General: The patient is alert and oriented x3 in no acute distress.  Dermatology: Skin is warm, dry and supple bilateral lower extremities. Negative for open lesions or macerations.  Vascular: Palpable pedal pulses bilaterally. No edema or erythema noted. Capillary refill within normal limits.  Neurological: Epicritic and protective threshold grossly intact bilaterally.   Musculoskeletal Exam: Severe pain on palpation and range of motion noted with inversion and eversion of the subtalar joint left lower extremity. Range of motion within normal limits to all pedal and ankle joints bilateral. Muscle strength 5/5 in all groups bilateral.   Radiographic Exam:  Normal osseous mineralization. Joint spaces preserved. No fracture/dislocation/boney destruction.    Assessment: #1 sinus tarsitis left lower extremity #2 subtalar joint pain left lower extremity #3 pain in left foot   Plan of Care:  #1 Patient was evaluated. #2 injection of 0.5 mL Celestone Soluspan injected in the patient's left subtalar joint sinus tarsi #3 discussed conservative management including ice, oral anti-inflammatory, reduction of activity, in good supportive shoe gear #4 return to clinic in 4 weeks   Edrick Kins, DPM [facility]  Dr. Edrick Kins, [address]                                        [place], [place] [address]                Office [phone]  Fax [phone]

## 2016-12-05 LAB — CEA (IN HOUSE-CHCC): CEA (CHCC-In House): 2.8 ng/mL (ref 0.00–5.00)

## 2016-12-06 ENCOUNTER — Ambulatory Visit (INDEPENDENT_AMBULATORY_CARE_PROVIDER_SITE_OTHER): Payer: Medicare Other | Admitting: Sports Medicine

## 2016-12-06 ENCOUNTER — Encounter: Payer: Self-pay | Admitting: Sports Medicine

## 2016-12-06 DIAGNOSIS — M79672 Pain in left foot: Secondary | ICD-10-CM

## 2016-12-06 DIAGNOSIS — E11610 Type 2 diabetes mellitus with diabetic neuropathic arthropathy: Secondary | ICD-10-CM | POA: Diagnosis not present

## 2016-12-06 DIAGNOSIS — L97521 Non-pressure chronic ulcer of other part of left foot limited to breakdown of skin: Secondary | ICD-10-CM | POA: Diagnosis not present

## 2016-12-06 DIAGNOSIS — E1142 Type 2 diabetes mellitus with diabetic polyneuropathy: Secondary | ICD-10-CM

## 2016-12-06 DIAGNOSIS — M79671 Pain in right foot: Secondary | ICD-10-CM

## 2016-12-06 MED ORDER — AMOXICILLIN-POT CLAVULANATE 875-125 MG PO TABS: 1.0000 | ORAL_TABLET | Freq: Two times a day (BID) | ORAL | 0 refills | Status: DC

## 2016-12-06 NOTE — Progress Notes (Signed)
Subjective: [name] is a 50 y.o. female patient seen today in office for follow up ulcer care bilateral. Patient is POV #14 (DOS 09-05-16), S/P Right foot wound debridement with placement of graft and for follow up eval of left foot ulcer.  Patient denies current pain at surgical site, states she had some pain on left at back of heel. Also on Augmentin as started by Dr. [name] denies calf pain, denies headache, chest pain, shortness of breath, nausea, vomiting, fever, or chills. Patient has home nursing every other day. No other issues noted.   Patient Active Problem List   Diagnosis Date Noted  . Cancer of sigmoid colon ([place]) 03/09/2015  . OSA (obstructive sleep apnea) 07/04/2014  . Migraine without aura, with intractable migraine, so stated, without mention of status migrainosus 03/26/2014  . Peripheral neuropathy ([place]) 03/03/2014  . Abnormal stress test 02/12/2014  . CVA (cerebral infarction) 02/12/2014  . Chest pain   . Abnormal heart rhythm   . Edema   . Hypertension   . Hyperlipemia   . Hypercholesterolemia   . Other and unspecified hyperlipidemia 11/14/2013  . Type II diabetes mellitus, uncontrolled ([place]) 07/22/2013  . Essential hypertension 07/22/2013  . 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 Diabetic polyneuropathy ([place]) 07/22/2013    Current Outpatient Prescriptions on File Prior to Visit  Medication Sig Dispense Refill  . atorvastatin (LIPITOR) 40 MG tablet Take 1 tablet (40 mg total) by mouth daily at 6 PM. 90 tablet 3  . BELSOMRA 20 MG TABS Take 1 tablet by mouth daily.    . canagliflozin (INVOKANA) 300 MG TABS tablet TAKE 300 MG BY MOUTH DAILY BEFORE BREAKFAST. (Patient taking differently: Take 300 mg by mouth daily before breakfast. TAKE 300 MG BY MOUTH DAILY BEFORE BREAKFAST.) 30 tablet 3  . CHANTIX 0.5 MG tablet Take 1 mg by mouth daily. Reported on 05/27/2016    . chlorproMAZINE (THORAZINE)  200 MG tablet Take 400 mg by mouth at bedtime.     . diazepam (VALIUM) 10 MG tablet Take 20 mg by mouth at bedtime.     [name] esomeprazole (NEXIUM) 40 MG capsule Take 40 mg by mouth daily.     [name] gabapentin (NEURONTIN) 300 MG capsule Take 2 capsules (600 mg total) by mouth 2 (two) times daily. 90 capsule 3  . glucose blood (FREESTYLE LITE) test strip Use as instructed to check blood sugar 3 times per day dx code E11.65 100 each 5  . HYDROcodone-acetaminophen (NORCO) 10-325 MG tablet Take 1 tablet by mouth every 6 (six) hours as needed. 30 tablet 0  . insulin aspart (NOVOLOG) 100 UNIT/ML injection Use per v-go pump 76 units daily 30 mL 2  . Insulin Disposable Pump (V-GO 40) KIT Use 1 device per day. 1 kit 3  . Lancets (FREESTYLE) lancets Use as instructed to check blood sugar 3 times per day dx code E11.65 100 each 5  . metFORMIN (GLUCOPHAGE-XR) 500 MG 24 hr tablet Take 4 tablets (2,000 mg total) by mouth daily with supper. 120 tablet 3  . rizatriptan (MAXALT) 10 MG tablet Take 1 tablet (10 mg total) by mouth as needed for migraine. May repeat in 2 hours if needed (Patient not taking: Reported on 12/02/2016) 10 tablet 3  . spironolactone (ALDACTONE) 100 MG tablet Take 1 tablet (100 mg total) by mouth daily. 90 tablet 3  . tamsulosin (FLOMAX) 0.4 MG CAPS capsule Take 0.4 mg by  mouth daily.    [name] tiZANidine (ZANAFLEX) 4 MG tablet Take 2 tablets by mouth at bedtime.    . Vortioxetine HBr 5 MG TABS Take 25 mg by mouth daily. Takes with a 20 mg tablet to equal 25 mg     Current Facility-Administered Medications on File Prior to Visit  Medication Dose Route Frequency Provider Last Rate Last Dose  . betamethasone acetate-betamethasone sodium phosphate (CELESTONE) injection 3 mg  3 mg Intramuscular Once Edrick Kins, DPM        Allergies  Allergen Reactions  . Latex Itching, Rash and Other (See Comments)    Pt states she cannot use condoms - cause an infection.  Use of latex on skin is okay.  Tape causes  rash  . Sweetening Enhancer [Flavoring Agent] Nausea And Vomiting and Other (See Comments)    HEADACHES  . Tetracyclines & Related Nausea And Vomiting and Other (See Comments)    ;YEAST INFECTIONS  . Aspartame And Phenylalanine Nausea And Vomiting    HEADACHES  . Ibuprofen Other (See Comments)    HEADACHES  . Trazodone And Nefazodone Other (See Comments)    Hallucinations   . Triazolam Other (See Comments)    HALLUCINATIONS    Objective: There were no vitals filed for this visit.  General: No acute distress, AAOx3  Right foot: Dorsal 5th toe healed with dry scab. At right medial midfoot ulceration healed with no surrounding infection, mild swelling right foot, no streaking erythema/cellulitis, no warmth, no drainage, no gross signs of infection noted, Capillary fill time <3 seconds in all digits, gross sensation present via light touch to right foot. Protective sensation absent. 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 No pain or crepitation with range of motion right foot.  No pain with calf compression.   To left foot there is a partial thickness ulceration that measures 0.4x0.5cm (same as previous) with new blistering and maceration into 1st interspace no other signs of infection. No other issues.  Assessment and Plan:  Problem List Items Addressed This Visit      Endocrine   Diabetic polyneuropathy ([place])    Other Visit Diagnoses    Foot ulceration, left, limited to breakdown of skin ([place])    -  Primary   with new blistering and maceration   Diabetic Charcot's foot ([place])       Right foot pain       Left foot pain         -Patient seen and evaluated -Debrided ulceration site on left using tissue nipper and saline moistened guaze -Applied betadine to left foot and antibiotic cream on right covered with dry sterile dressing and coban -Advised patient to make sure to keep dressings clean, dry, and intact; Nursing to continue with every dressing changes every  other day reapplying betadine on left and antibiotic cream on right.  -Refilled Augmentin  -Continue with Gabapentin 320m qhs -Advised patient to continue with post-op shoe on right and left foot with felt padding; TO ORDER CUSTOM SHOES AND INSERTS AT NEXT VISIT -Advised patient to limit activity to necessity  -Advised patient to elevate as necessary  -Return 1 week for continued wound care. In the meantime, patient to call office if any issues or problems arise.  TLandis Martins DPM

## 2016-12-08 NOTE — Telephone Encounter (Addendum)
-----   Message from Landis Martins, [place] sent at 12/06/2016  8:59 PM EST ----- Regarding: Home wound care orders Apply betadine to left plantar ulcer and blister site in between toes, 4x4, kerlix, coban Apply antibiotic cream and protective dressing to right foot healed ulceration site covered with 4x4, kerlix, coban -Dr. Cannon Kettle. 12/08/2016-Faxed copy of 12/06/2016 orders to [facility].12/09/2016-[name] - [facility] states pt says she has new wound care orders. I called [name] and explained the orders were confirmed received 12/08/2016 at 3:13pm. 12/30/2016-[name] - [facility] states previously seeing pt on Monday and [facility] would see pt on Tuesday, now right foot is healed and being wrapped for cushion and left foot only has a small hole, could they perform wound care on Friday and our office continue the wound care Tuesday. I told [name] - [facility] that would be fine, pt would be covered for wound care at home and in our office and if there was changes Dr. Cannon Kettle would give new orders.

## 2016-12-13 ENCOUNTER — Ambulatory Visit (INDEPENDENT_AMBULATORY_CARE_PROVIDER_SITE_OTHER): Payer: Medicare Other | Admitting: Sports Medicine

## 2016-12-13 ENCOUNTER — Encounter: Payer: Self-pay | Admitting: Sports Medicine

## 2016-12-13 ENCOUNTER — Ambulatory Visit: Payer: Medicare Other | Admitting: Sports Medicine

## 2016-12-13 DIAGNOSIS — M79672 Pain in left foot: Secondary | ICD-10-CM

## 2016-12-13 DIAGNOSIS — E11610 Type 2 diabetes mellitus with diabetic neuropathic arthropathy: Secondary | ICD-10-CM | POA: Diagnosis not present

## 2016-12-13 DIAGNOSIS — L97521 Non-pressure chronic ulcer of other part of left foot limited to breakdown of skin: Secondary | ICD-10-CM | POA: Diagnosis not present

## 2016-12-13 DIAGNOSIS — M79671 Pain in right foot: Secondary | ICD-10-CM | POA: Diagnosis not present

## 2016-12-13 DIAGNOSIS — E1142 Type 2 diabetes mellitus with diabetic polyneuropathy: Secondary | ICD-10-CM

## 2016-12-13 MED ORDER — FLUCONAZOLE 150 MG PO TABS: 150.0000 mg | ORAL_TABLET | Freq: Once | ORAL | 1 refills | Status: AC

## 2016-12-13 NOTE — Progress Notes (Signed)
Subjective: [name] is a 50 y.o. female patient seen today in office for follow up ulcer care bilateral. Patient is POV #15 (DOS 09-05-16), S/P Right foot wound debridement with placement of graft and for follow up eval of left foot ulcer.  Patient denies current pain at surgical site, On Augmentin desires Diflucan. Denies calf pain, denies headache, chest pain, shortness of breath, nausea, vomiting, fever, or chills. Patient has home nursing every other day. No other issues noted.   Patient Active Problem List   Diagnosis Date Noted  . Cancer of sigmoid colon ([place]) 03/09/2015  . OSA (obstructive sleep apnea) 07/04/2014  . Migraine without aura, with intractable migraine, so stated, without mention of status migrainosus 03/26/2014  . Peripheral neuropathy ([place]) 03/03/2014  . Abnormal stress test 02/12/2014  . CVA (cerebral infarction) 02/12/2014  . Chest pain   . Abnormal heart rhythm   . Edema   . Hypertension   . Hyperlipemia   . Hypercholesterolemia   . Other and unspecified hyperlipidemia 11/14/2013  . Type II diabetes mellitus, uncontrolled ([place]) 07/22/2013  . Essential hypertension 07/22/2013  . 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 Diabetic polyneuropathy ([place]) 07/22/2013    Current Outpatient Prescriptions on File Prior to Visit  Medication Sig Dispense Refill  . amoxicillin-clavulanate (AUGMENTIN) 875-125 MG tablet Take 1 tablet by mouth 2 (two) times daily. 14 tablet 0  . atorvastatin (LIPITOR) 40 MG tablet Take 1 tablet (40 mg total) by mouth daily at 6 PM. 90 tablet 3  . BELSOMRA 20 MG TABS Take 1 tablet by mouth daily.    . canagliflozin (INVOKANA) 300 MG TABS tablet TAKE 300 MG BY MOUTH DAILY BEFORE BREAKFAST. (Patient taking differently: Take 300 mg by mouth daily before breakfast. TAKE 300 MG BY MOUTH DAILY BEFORE BREAKFAST.) 30 tablet 3  . CHANTIX 0.5 MG tablet Take 1 mg by mouth  daily. Reported on 05/27/2016    . chlorproMAZINE (THORAZINE) 200 MG tablet Take 400 mg by mouth at bedtime.     . diazepam (VALIUM) 10 MG tablet Take 20 mg by mouth at bedtime.     [name] esomeprazole (NEXIUM) 40 MG capsule Take 40 mg by mouth daily.     [name] gabapentin (NEURONTIN) 300 MG capsule Take 2 capsules (600 mg total) by mouth 2 (two) times daily. 90 capsule 3  . glucose blood (FREESTYLE LITE) test strip Use as instructed to check blood sugar 3 times per day dx code E11.65 100 each 5  . HYDROcodone-acetaminophen (NORCO) 10-325 MG tablet Take 1 tablet by mouth every 6 (six) hours as needed. 30 tablet 0  . insulin aspart (NOVOLOG) 100 UNIT/ML injection Use per v-go pump 76 units daily 30 mL 2  . Insulin Disposable Pump (V-GO 40) KIT Use 1 device per day. 1 kit 3  . Lancets (FREESTYLE) lancets Use as instructed to check blood sugar 3 times per day dx code E11.65 100 each 5  . metFORMIN (GLUCOPHAGE-XR) 500 MG 24 hr tablet Take 4 tablets (2,000 mg total) by mouth daily with supper. 120 tablet 3  . rizatriptan (MAXALT) 10 MG tablet Take 1 tablet (10 mg total) by mouth as needed for migraine. May repeat in 2 hours if needed (Patient not taking: Reported on 12/02/2016) 10 tablet 3  . spironolactone (ALDACTONE) 100 MG tablet Take 1 tablet (100 mg total) by mouth daily. 90 tablet 3  . tamsulosin (FLOMAX) 0.4 MG  CAPS capsule Take 0.4 mg by mouth daily.    [name] tiZANidine (ZANAFLEX) 4 MG tablet Take 2 tablets by mouth at bedtime.    . Vortioxetine HBr 5 MG TABS Take 25 mg by mouth daily. Takes with a 20 mg tablet to equal 25 mg     Current Facility-Administered Medications on File Prior to Visit  Medication Dose Route Frequency Provider Last Rate Last Dose  . betamethasone acetate-betamethasone sodium phosphate (CELESTONE) injection 3 mg  3 mg Intramuscular Once Edrick Kins, DPM        Allergies  Allergen Reactions  . Latex Itching, Rash and Other (See Comments)    Pt states she cannot use condoms - cause  an infection.  Use of latex on skin is okay.  Tape causes rash  . Sweetening Enhancer [Flavoring Agent] Nausea And Vomiting and Other (See Comments)    HEADACHES  . Tetracyclines & Related Nausea And Vomiting and Other (See Comments)    ;YEAST INFECTIONS  . Aspartame And Phenylalanine Nausea And Vomiting    HEADACHES  . Ibuprofen Other (See Comments)    HEADACHES  . Trazodone And Nefazodone Other (See Comments)    Hallucinations   . Triazolam Other (See Comments)    HALLUCINATIONS    Objective: There were no vitals filed for this visit.  General: No acute distress, AAOx3  Right foot: Dorsal 5th toe healed with dry scab. At right medial midfoot ulceration healed with no surrounding infection, mild swelling right foot, no streaking erythema/cellulitis, no warmth, no drainage, no gross signs of infection noted, Capillary fill time <3 seconds in all digits, gross sensation present via light touch to right foot. Protective sensation absent. 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 No pain or crepitation with range of motion right foot.  No pain with calf compression.   To left foot there is a partial thickness ulceration that measures 3x0.5cm,  no maceration extends into 1st interspace no other signs of infection. No other issues.  Assessment and Plan:  Problem List Items Addressed This Visit      Endocrine   Diabetic polyneuropathy ([place])    Other Visit Diagnoses    Foot ulceration, left, limited to breakdown of skin ([place])    -  Primary   Diabetic Charcot's foot ([place])       Right foot pain       Left foot pain         -Patient seen and evaluated -Debrided ulceration site on left using saline moistened guaze -Applied betadine to left foot and antibiotic cream on right covered with dry sterile dressing and coban -Advised patient to make sure to keep dressings clean, dry, and intact; Nursing to continue with every dressing changes every other day reapplying betadine on  left and antibiotic cream on right.  -Continue with Augmentin  -Rx Diflucan -Continue with Gabapentin 323m qhs -Advised patient to continue with post-op shoe on right and left foot with felt padding; TO ORDER CUSTOM SHOES AND INSERTS AT NEXT VISIT -Advised patient to limit activity to necessity  -Advised patient to elevate as necessary  -Return 1-2 weeks for continued wound care. In the meantime, patient to call office if any issues or problems arise.  TLandis Martins DPM

## 2016-12-20 ENCOUNTER — Other Ambulatory Visit: Payer: Self-pay | Admitting: Endocrinology

## 2016-12-27 ENCOUNTER — Ambulatory Visit (INDEPENDENT_AMBULATORY_CARE_PROVIDER_SITE_OTHER): Payer: Medicare Other | Admitting: Sports Medicine

## 2016-12-27 ENCOUNTER — Encounter: Payer: Self-pay | Admitting: Sports Medicine

## 2016-12-27 DIAGNOSIS — L97521 Non-pressure chronic ulcer of other part of left foot limited to breakdown of skin: Secondary | ICD-10-CM | POA: Diagnosis not present

## 2016-12-27 DIAGNOSIS — M79672 Pain in left foot: Secondary | ICD-10-CM

## 2016-12-27 DIAGNOSIS — M79671 Pain in right foot: Secondary | ICD-10-CM

## 2016-12-27 DIAGNOSIS — E11610 Type 2 diabetes mellitus with diabetic neuropathic arthropathy: Secondary | ICD-10-CM

## 2016-12-27 LAB — HM DIABETES FOOT EXAM: HM Diabetic Foot Exam: ABNORMAL

## 2016-12-27 NOTE — Progress Notes (Signed)
Subjective: [name] is a 51 y.o. female patient seen today in office for follow up ulcer care bilateral. Patient is POV #16 (DOS 09-05-16), S/P Right foot wound debridement with placement of graft and for follow up eval of left foot ulcer.  Patient denies current pain at surgical site, On Augmentin with a few days left. Denies calf pain, denies headache, chest pain, shortness of breath, nausea, vomiting, fever, or chills. Patient has home nursing every other day. No other issues noted.   Patient Active Problem List   Diagnosis Date Noted  . Cancer of sigmoid colon ([place]) 03/09/2015  . OSA (obstructive sleep apnea) 07/04/2014  . Migraine without aura, with intractable migraine, so stated, without mention of status migrainosus 03/26/2014  . Peripheral neuropathy ([place]) 03/03/2014  . Abnormal stress test 02/12/2014  . CVA (cerebral infarction) 02/12/2014  . Chest pain   . Abnormal heart rhythm   . Edema   . Hypertension   . Hyperlipemia   . Hypercholesterolemia   . Other and unspecified hyperlipidemia 11/14/2013  . Type II diabetes mellitus, uncontrolled ([place]) 07/22/2013  . Essential hypertension 07/22/2013  . 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 Diabetic polyneuropathy ([place]) 07/22/2013    Current Outpatient Prescriptions on File Prior to Visit  Medication Sig Dispense Refill  . amoxicillin-clavulanate (AUGMENTIN) 875-125 MG tablet Take 1 tablet by mouth 2 (two) times daily. 14 tablet 0  . atorvastatin (LIPITOR) 40 MG tablet Take 1 tablet (40 mg total) by mouth daily at 6 PM. 90 tablet 3  . BELSOMRA 20 MG TABS Take 1 tablet by mouth daily.    . CHANTIX 0.5 MG tablet Take 1 mg by mouth daily. Reported on 05/27/2016    . chlorproMAZINE (THORAZINE) 200 MG tablet Take 400 mg by mouth at bedtime.     . diazepam (VALIUM) 10 MG tablet Take 20 mg by mouth at bedtime.     [name] esomeprazole (NEXIUM) 40 MG capsule Take 40 mg by  mouth daily.     [name] gabapentin (NEURONTIN) 300 MG capsule Take 2 capsules (600 mg total) by mouth 2 (two) times daily. 90 capsule 3  . glucose blood (FREESTYLE LITE) test strip Use as instructed to check blood sugar 3 times per day dx code E11.65 100 each 5  . HYDROcodone-acetaminophen (NORCO) 10-325 MG tablet Take 1 tablet by mouth every 6 (six) hours as needed. 30 tablet 0  . insulin aspart (NOVOLOG) 100 UNIT/ML injection Use per v-go pump 76 units daily 30 mL 2  . Insulin Disposable Pump (V-GO 40) KIT Use 1 device per day. 1 kit 3  . INVOKANA 300 MG TABS tablet TAKE (1) TABLET BY MOUTH DAILY BEFORE BREAKFAST 30 tablet 11  . Lancets (FREESTYLE) lancets Use as instructed to check blood sugar 3 times per day dx code E11.65 100 each 5  . metFORMIN (GLUCOPHAGE-XR) 500 MG 24 hr tablet Take 4 tablets (2,000 mg total) by mouth daily with supper. 120 tablet 3  . rizatriptan (MAXALT) 10 MG tablet Take 1 tablet (10 mg total) by mouth as needed for migraine. May repeat in 2 hours if needed (Patient not taking: Reported on 12/02/2016) 10 tablet 3  . spironolactone (ALDACTONE) 100 MG tablet Take 1 tablet (100 mg total) by mouth daily. 90 tablet 3  . spironolactone (ALDACTONE) 50 MG tablet TAKE (1) TABLET BY MOUTH DAILY 30 tablet 11  . tamsulosin (FLOMAX) 0.4 MG CAPS capsule Take 0.4 mg by mouth  daily.    . tiZANidine (ZANAFLEX) 4 MG tablet Take 2 tablets by mouth at bedtime.    . Vortioxetine HBr 5 MG TABS Take 25 mg by mouth daily. Takes with a 20 mg tablet to equal 25 mg     Current Facility-Administered Medications on File Prior to Visit  Medication Dose Route Frequency Provider Last Rate Last Dose  . betamethasone acetate-betamethasone sodium phosphate (CELESTONE) injection 3 mg  3 mg Intramuscular Once Edrick Kins, DPM        Allergies  Allergen Reactions  . Latex Itching, Rash and Other (See Comments)    Pt states she cannot use condoms - cause an infection.  Use of latex on skin is okay.  Tape  causes rash  . Sweetening Enhancer [Flavoring Agent] Nausea And Vomiting and Other (See Comments)    HEADACHES  . Tetracyclines & Related Nausea And Vomiting and Other (See Comments)    ;YEAST INFECTIONS  . Aspartame And Phenylalanine Nausea And Vomiting    HEADACHES  . Ibuprofen Other (See Comments)    HEADACHES  . Trazodone And Nefazodone Other (See Comments)    Hallucinations   . Triazolam Other (See Comments)    HALLUCINATIONS    Objective: There were no vitals filed for this visit.  General: No acute distress, AAOx3  Right foot: Dorsal 5th toe healed with dry scab. At right medial midfoot ulceration healed with no surrounding infection or re-opening, mild swelling right foot, no streaking erythema/cellulitis, no warmth, no drainage, no gross signs of infection noted, Capillary fill time <3 seconds in all digits, gross sensation present via light touch to right foot. Protective sensation absent. 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 No pain or crepitation with range of motion right foot.  No pain with calf compression.   To left foot there is a partial thickness ulceration that measures 3x0.4cm,  no maceration extends into 1st interspace no other signs of infection. No other issues.  Assessment and Plan:  Problem List Items Addressed This Visit    None    Visit Diagnoses    Foot ulceration, left, limited to breakdown of skin ([place])    -  Primary   Diabetic Charcot's foot ([place])       Right foot pain       Left foot pain         -Patient seen and evaluated -Debrided ulceration site on left using sterile chisel blade -Applied betadine to left foot and antibiotic cream on right covered with dry sterile dressing and coban -Advised patient to make sure to keep dressings clean, dry, and intact; Nursing to continue with every dressing changes every other day reapplying betadine on left and antibiotic cream on right.  -Continue with Augmentin until  complted -Continue with Gabapentin 323m qhs -Advised patient to continue with post-op shoe on right and left foot with felt padding;[employer] diabetic shoe order form was completed; office to contact primary care for approval / certification;  Office to arrange shoe fitting and dispensing. -Advised patient to limit activity to necessity  -Advised patient to elevate as necessary  -Return 2 weeks for continued wound care. In the meantime, patient to call office if any issues or problems arise.  TLandis Martins DPM

## 2016-12-28 ENCOUNTER — Encounter: Payer: Self-pay | Admitting: Endocrinology

## 2016-12-28 ENCOUNTER — Ambulatory Visit (INDEPENDENT_AMBULATORY_CARE_PROVIDER_SITE_OTHER): Payer: Medicare Other | Admitting: Endocrinology

## 2016-12-28 VITALS — BP 111/86 | HR 86 | Ht 66.14 in | Wt 172.6 lb

## 2016-12-28 DIAGNOSIS — Z794 Long term (current) use of insulin: Secondary | ICD-10-CM

## 2016-12-28 DIAGNOSIS — E876 Hypokalemia: Secondary | ICD-10-CM | POA: Diagnosis not present

## 2016-12-28 DIAGNOSIS — E1165 Type 2 diabetes mellitus with hyperglycemia: Secondary | ICD-10-CM | POA: Diagnosis not present

## 2016-12-28 LAB — POCT GLYCOSYLATED HEMOGLOBIN (HGB A1C): Hemoglobin A1C: 10.1

## 2016-12-28 LAB — BASIC METABOLIC PANEL
BUN: 10 mg/dL (ref 6–23)
CHLORIDE: 97 meq/L (ref 96–112)
CO2: 24 meq/L (ref 19–32)
CREATININE: 0.76 mg/dL (ref 0.40–1.20)
Calcium: 9.5 mg/dL (ref 8.4–10.5)
GFR: 85.07 mL/min (ref >60.00)
Glucose, Bld: 284 mg/dL — ABNORMAL HIGH (ref 70–99)
Potassium: 4.1 mEq/L (ref 3.5–5.1)
Sodium: 133 mEq/L — ABNORMAL LOW (ref 135–145)

## 2016-12-28 LAB — LIPID PANEL
CHOL/HDL RATIO: 3
Cholesterol: 111 mg/dL (ref 0–200)
HDL: 34.1 mg/dL — ABNORMAL LOW (ref >39.00)
NONHDL: 76.96
Triglycerides: 291 mg/dL — ABNORMAL HIGH (ref 0.0–149.0)
VLDL: 58.2 mg/dL — ABNORMAL HIGH (ref 0.0–40.0)

## 2016-12-28 LAB — LDL CHOLESTEROL, DIRECT: LDL DIRECT: 46 mg/dL

## 2016-12-28 LAB — GLUCOSE, POCT (MANUAL RESULT ENTRY): POC Glucose: 266 mg/dl — AB (ref 70–99)

## 2016-12-28 NOTE — Patient Instructions (Addendum)
MUST CHANGE PUMP every 24 hours every afternoon  Spironolactone 100mg   Daily  Let me know sugar level is in 2 weeks

## 2016-12-28 NOTE — Progress Notes (Signed)
Patient ID: [name], female   DOB: 19-Nov-1966, 51 y.o.   MRN: [id number]   Reason for Appointment: Diabetes follow-up    History of Present Illness:   Diagnosis: Type 2 diabetes mellitus, date of diagnosis: 2004.  PAST history: She has been treated mostly with insulin since about a year after diagnosis. She has had difficulty with consistent compliance with diet and also compliance with self care including glucose monitoring over the years. She had been mostly treated with basal insulin. Also had been tried on mealtime insulin but she would be noncompliant with this. Was tried on Prandin for mealtime control but difficult to judge efficacy because of lack of postprandial monitoring. Was given Victoza to start in 2011 but did not follow up after this.She was tried on premixed insulin but this did not help her control, mostly because of noncompliance with the doses. She had been using the V- go pump and had better control initially and was better compliant with the daily routine and boluses. She has had frequent education visits also. She stopped using her V.-go pump in 6/15 because of discomfort at the site of application Also did not improve with a trial of Victoza   RECENT history:    INSULIN dose:  V-go 40 unit pump,  Mealtime bolus 4 clicks, 1x daily Non-insulin hypoglycemic drugs: Invokana 300 mg in the morning  She did start the V-go pump in early June because of her poor control especially postprandially Her A1c has been consistently high and now 10.1, had come down to 9.7 on the last visit  Current management and problems identified:  She has not checked her blood sugars as she does not like to do that  Although she thinks she is avoiding drinks with sugar now and only drinking water her blood sugars are not better  After questioning it appears that patient is not changing her V-go pump every day and she continues to use it unless she is not able to deliver  boluses, usually changing the pump every 2 days  Glucose 266 today, had not changed her pump yesterday or today  She thinks she is eating only one meal a day and has not eaten anything today, occasionally may have a snack also  She does not cover her snacks with any boluses  Also previously was taking as many as 6 clicks for each meal but is not doing only 4  She has little motivation to take care of her diabetes   Glucometer:  FreeStyle.  Checking blood sugar  As above Blood Glucose readings:    Hypoglycemia frequency: Never.    Food preferences: eating 1 or 2 meals per day, variable intake, sandwiches at times or otherwise snacks, dinner usually 8 PM  Physical activity: exercise: Minimal.  She has difficulties with leg pain Certified Diabetes Educator visit: Most recent:, 2/14.   The diet that the patient has been following is no specific diet; still eating at irregular times; not drinking drinks with sugar    Wt Readings from Last 3 Encounters:  12/28/16 172 lb 9.6 oz (78.3 kg)  12/02/16 187 lb 3.2 oz (84.9 kg)  10/19/16 192 lb (87.1 kg)   DM labs:   Lab Results  Component Value Date   HGBA1C 10.1 12/28/2016   HGBA1C 9.7 (H) 09/05/2016   HGBA1C 10.2 (H) 05/25/2016   Lab Results  Component Value Date   MICROALBUR 0.4 10/19/2016   LDLCALC 41 01/01/2016   CREATININE 0.8 12/02/2016  Office Visit on 12/28/2016  Component Date Value Ref Range Status  . Hemoglobin A1C 12/28/2016 10.1   Final  . POC Glucose 12/28/2016 266* 70 - 99 mg/dl Final  . HM Diabetic Foot Exam 12/27/2016 Abnormal   Final     Other active problems: See review of systems    Allergies as of 12/28/2016      Reactions   Latex Itching, Rash, Other (See Comments)   Pt states she cannot use condoms - cause an infection.  Use of latex on skin is okay.  Tape causes rash   Sweetening Enhancer [flavoring Agent] Nausea And Vomiting, Other (See Comments)   HEADACHES   Tetracyclines & Related Nausea  And Vomiting, Other (See Comments)   ;YEAST INFECTIONS   Aspartame And Phenylalanine Nausea And Vomiting   HEADACHES   Ibuprofen Other (See Comments)   HEADACHES   Trazodone And Nefazodone Other (See Comments)   Hallucinations   Triazolam Other (See Comments)   HALLUCINATIONS      Medication List       Accurate as of 12/28/16  3:16 PM. Always use your most recent med list.          atorvastatin 40 MG tablet Commonly known as:  LIPITOR Take 1 tablet (40 mg total) by mouth daily at 6 PM.   BELSOMRA 20 MG Tabs Generic drug:  Suvorexant Take 1 tablet by mouth daily.   CHANTIX 0.5 MG tablet Generic drug:  varenicline Take 1 mg by mouth daily. Reported on 05/27/2016   chlorproMAZINE 200 MG tablet Commonly known as:  THORAZINE Take 400 mg by mouth at bedtime.   diazepam 10 MG tablet Commonly known as:  VALIUM Take 20 mg by mouth at bedtime.   esomeprazole 40 MG capsule Commonly known as:  NEXIUM Take 40 mg by mouth daily.   freestyle lancets Use as instructed to check blood sugar 3 times per day dx code E11.65   gabapentin 300 MG capsule Commonly known as:  NEURONTIN Take 2 capsules (600 mg total) by mouth 2 (two) times daily.   glucose blood test strip Commonly known as:  FREESTYLE LITE Use as instructed to check blood sugar 3 times per day dx code E11.65   HYDROcodone-acetaminophen 10-325 MG tablet Commonly known as:  NORCO Take 1 tablet by mouth every 6 (six) hours as needed.   insulin aspart 100 UNIT/ML injection Commonly known as:  NOVOLOG Use per v-go pump 76 units daily   INVOKANA 300 MG Tabs tablet Generic drug:  canagliflozin TAKE (1) TABLET BY MOUTH DAILY BEFORE BREAKFAST   metFORMIN 500 MG 24 hr tablet Commonly known as:  GLUCOPHAGE-XR Take 4 tablets (2,000 mg total) by mouth daily with supper.   rizatriptan 10 MG tablet Commonly known as:  MAXALT Take 1 tablet (10 mg total) by mouth as needed for migraine. May repeat in 2 hours if needed     spironolactone 100 MG tablet Commonly known as:  ALDACTONE Take 1 tablet (100 mg total) by mouth daily.   tamsulosin 0.4 MG Caps capsule Commonly known as:  FLOMAX Take 0.4 mg by mouth daily.   tiZANidine 4 MG tablet Commonly known as:  ZANAFLEX Take 2 tablets by mouth at bedtime.   V-GO 40 Kit Use 1 device per day.   vortioxetine HBr 5 MG Tabs Commonly known as:  TRINTELLIX Take 25 mg by mouth daily. Takes with a 20 mg tablet to equal 25 mg       Allergies:  Allergies    Allergen Reactions  . Latex Itching, Rash and Other (See Comments)    Pt states she cannot use condoms - cause an infection.  Use of latex on skin is okay.  Tape causes rash  . Sweetening Enhancer [Flavoring Agent] Nausea And Vomiting and Other (See Comments)    HEADACHES  . Tetracyclines & Related Nausea And Vomiting and Other (See Comments)    ;YEAST INFECTIONS  . Aspartame And Phenylalanine Nausea And Vomiting    HEADACHES  . Ibuprofen Other (See Comments)    HEADACHES  . Trazodone And Nefazodone Other (See Comments)    Hallucinations   . Triazolam Other (See Comments)    HALLUCINATIONS    Past Medical History:  Diagnosis Date  . Anginal pain (HCC)   . Anxiety   . Arthritis    "qwhere" (02/12/2014)  . Bipolar disorder (HCC)   . Cancer (HCC)    colon cancer  . Chest pain    a. 2008 Cath: normal cors;  b. 12/2013 Lexi CL: EF 47%, no ischemia/infarct.  . Cholecystitis   . Chronic back pain   . Chronic pain   . Claudication (HCC)    a. 12/2013 ABI's: R 0.97, L 0.94.  . COPD (chronic obstructive pulmonary disease) (HCC)   . Depression   . Diabetic foot ulcer (HCC)    chronic  . DJD (degenerative joint disease)   . Edema   . Gastroparesis   . GERD (gastroesophageal reflux disease)   . H/O hiatal hernia   . Headache(784.0)    "weekly" (02/12/2014)  . History of blood transfusion 1986   "related to lost a child" (02/12/2014)  . Hypercholesterolemia   . Hyperlipemia   . Hypersomnia   .  Hypertension   . Insomnia   . MRSA (methicillin resistant Staphylococcus aureus)   . Neurogenic bladder   . OSA (obstructive sleep apnea) 07/04/2014  . Palpitations   . Personality disorder   . Pneumonia    "twice, I think" (02/12/2014)  . Sleep apnea    didn't tolerate cpap (02/12/2014)  . SOB (shortness of breath) on exertion   . Stroke (HCC) 02/12/2014   "eyesight is messed up; not steady on my feet" (02/12/2014)  . SUI (stress urinary incontinence, female) S/P SLING 12-29-2011  . Tobacco abuse   . Type II diabetes mellitus (HCC)   . Ulcer (HCC)   . Vertigo     Past Surgical History:  Procedure Laterality Date  . ABDOMINAL HYSTERECTOMY    . ANTERIOR CERVICAL DECOMP/DISCECTOMY FUSION  2000   C6 - 7  . CARDIAC CATHETERIZATION  05-22-2008   DR SKAINS   NO SIGNIFECANT CAD/ NORMAL LV/  EF 65%/  NO WALL MOTION ABNORMALITIES  . CARDIAC CATHETERIZATION  02/12/2014  . CARPAL TUNNEL RELEASE Right 04-25-2013  . CESAREAN SECTION  1989; 1992  . CYSTO N/A 04/30/2013   Procedure: CYSTOSCOPY;  Surgeon: Scott A MacDiarmid, MD;  Location: [facility];  Service: Urology;  Laterality: N/A;  . CYSTOSCOPY WITH INJECTION  05/04/2012   Procedure: CYSTOSCOPY WITH INJECTION;  Surgeon: Scott A MacDiarmid, MD;  Location: [facility];  Service: Urology;  Laterality: N/A;  MACROPLASTIQUE INJECTION  . CYSTOSCOPY WITH INJECTION  08/28/2012   Procedure: CYSTOSCOPY WITH INJECTION;  Surgeon: Scott A MacDiarmid, MD;  Location: [facility];  Service: Urology;  Laterality: N/A;  cysto and macroplastique   . FOOT SURGERY Bilateral   . HERNIA REPAIR  ?1996   "stomach"  . KNEE ARTHROSCOPY W/ ALLOGRAFT   IMPANT Left    graft x 2  . KNEE SURGERY     TOTAL 8 SURG'S  . LAPAROSCOPIC SIGMOID COLECTOMY  04/22/2015  . LAPAROSCOPIC SIGMOID COLECTOMY N/A 04/22/2015   Procedure: LAPAROSCOPIC HAND ASSISTED SIGMOID COLECTOMY;  Surgeon: Thomas Cornett, MD;  Location: [facility];  Service: General;  Laterality: N/A;    . LEFT HEART CATHETERIZATION WITH CORONARY ANGIOGRAM N/A 02/12/2014   Procedure: LEFT HEART CATHETERIZATION WITH CORONARY ANGIOGRAM;  Surgeon: Mark Skains, MD;  Location: [facility];  Service: Cardiovascular;  Laterality: N/A;  . LUMBAR FUSION     "cage in my spine"  . MULTIPLE LAPAROSCOPIES FOR ENDOMETRIOSIS    . PUBOVAGINAL SLING  12/29/2011   Procedure: PUBO-VAGINAL SLING;  Surgeon: Scott A MacDiarmid, MD;  Location: [facility];  Service: Urology;  Laterality: N/A;  cysto and sparc sling   . PUBOVAGINAL SLING N/A 04/30/2013   Procedure: REMOVAL OF VAGINAL MESH;  Surgeon: Scott A MacDiarmid, MD;  Location: [facility];  Service: Urology;  Laterality: N/A;  . RECONSTURCTION OF CONGENITAL UTERUS ANOMALY  1983  . REPEAT RECONSTRUCTION ACL LEFT KNEE/ SCREWS REMOVED  03-28-2000   CADAVER GRAFT  . TOTAL ABDOMINAL HYSTERECTOMY W/ BILATERAL SALPINGOOPHORECTOMY  1997  . WOUND DEBRIDEMENT Right 09/05/2016   Procedure: DEBRIDEMENT WOUND WITH GRAFT RIGHT FOOT;  Surgeon: Titorya Stover, DPM;  Location: [facility];  Service: Podiatry;  Laterality: Right;    Family History  Problem Relation Age of Onset  . Hypertension Mother   . Diabetes Mother   . Cancer - Other Mother     lymphoma   . Cancer - Other Father     lung, bladder cancer   . Heart attack Father   . Cancer - Other Brother     bladder cancer     Social History:  reports that she has been smoking Cigarettes and E-cigarettes.  She has a 10.25 pack-year smoking history. She has never used smokeless tobacco. She reports that she does not drink alcohol or use drugs.  Review of Systems -   DEPRESSION: On treatment  She has history of high triglycerides treated with fenofibrate   Currently she is taking Lipitor 40 mg for high LDL  Lab Results  Component Value Date   CHOL 124 10/19/2016   HDL 34.90 (L) 10/19/2016   LDLCALC 41 01/01/2016   LDLDIRECT 52.0 10/19/2016   TRIG 360.0 (H) 10/19/2016   CHOLHDL 4 10/19/2016     Hypertension: This has been relatively mild and controlled . She is taking spironolactone ?  100 mg daily  HYPOKALEMIA:  Her last potassium was Normal; she does not like to take any potassium supplements Not clear why her potassium levels fluctuate   Lab Results  Component Value Date   CREATININE 0.8 12/02/2016   BUN 5.3 (L) 12/02/2016   NA 134 (L) 12/02/2016   K 3.7 12/02/2016   CL 97 10/19/2016   CO2 23 12/02/2016    Painful neuropathy: She has had persistent symptoms including leg pains and numbness as well as difficulty with balance  Previously has tried various drugs including gabapentin without much relief  Foot exam Last showed absent pedal pulses and distal sensation Followed by podiatrist, last seen on 12/27/16    Examination:   BP 111/86   Pulse 86   Ht 5' 6.14" (1.68 m)   Wt 172 lb 9.6 oz (78.3 kg)   SpO2 95%   BMI 27.74 kg/m   Body mass index is 27.74 kg/m.       Assesment/Plan:   1. Diabetes type 2, with persistently poor control, BMI 35 See history of present illness for detailed discussion of current diabetes management, blood sugar patterns and problems identified  She has had poor control consistently with A1c still around 10% Difficult to assess her level of control because of lack of adequate glucose monitoring at home. Although she is stopping her sugar drinks she is not motivated to check her blood sugar at all  She is also not understanding the need to change her V-go pump every 24 hours and is mostly changing every 48 hours now Glucose is 266 today  Also not taking enough insulin to cover her meals and snacks  Recommendations:  Restart glucose monitoring as discussed at least every other day  Check some readings fasting and some before and after eating  Have stressed the importance of changing her pump every 24 hours at the same time  Most likely will need more boluses at meals but not clear unless she gets enough basal  consistently  She does need to take extra boluses for high sugars and also one to 2 clicks for snacks  2.  Neuropathy and foot ulcers: Followed by podiatrist   3.  She will have potassium rechecked today, needs to confirm that she is taking 100 mg of Aldactone   Patient Instructions  MUST CHANGE PUMP every 24 hours every afternoon  Spironolactone 124m  Daily  Let me know sugar level is in 2 weeks    Counseling time on subjects discussed above is over 50% of today's 25 minute visit   Damonte Frieson 12/28/2016, 3:16 PM

## 2016-12-30 NOTE — Telephone Encounter (Signed)
Yes that schedule is fine since her wounds are getting better -Dr. Cannon Kettle

## 2017-01-03 ENCOUNTER — Ambulatory Visit: Payer: Medicare Other | Admitting: Sports Medicine

## 2017-01-10 ENCOUNTER — Ambulatory Visit (INDEPENDENT_AMBULATORY_CARE_PROVIDER_SITE_OTHER): Payer: Self-pay | Admitting: Sports Medicine

## 2017-01-10 ENCOUNTER — Encounter: Payer: Self-pay | Admitting: Sports Medicine

## 2017-01-10 DIAGNOSIS — E11610 Type 2 diabetes mellitus with diabetic neuropathic arthropathy: Secondary | ICD-10-CM

## 2017-01-10 DIAGNOSIS — M79671 Pain in right foot: Secondary | ICD-10-CM

## 2017-01-10 DIAGNOSIS — L97521 Non-pressure chronic ulcer of other part of left foot limited to breakdown of skin: Secondary | ICD-10-CM

## 2017-01-10 DIAGNOSIS — E1142 Type 2 diabetes mellitus with diabetic polyneuropathy: Secondary | ICD-10-CM

## 2017-01-10 DIAGNOSIS — M79672 Pain in left foot: Secondary | ICD-10-CM

## 2017-01-10 NOTE — Progress Notes (Signed)
Subjective: [name] is a 51 y.o. female patient seen today in office for follow up ulcer care bilateral. Patient is POV #17 (DOS 09-05-16), S/P Right foot wound debridement with placement of graft and for follow up eval of left foot ulcer.  Patient denies current pain. Denies calf pain, denies headache, chest pain, shortness of breath, nausea, vomiting, fever, or chills. Patient has home nursing every other day. No other issues noted.   Patient Active Problem List   Diagnosis Date Noted  . Cancer of sigmoid colon ([place]) 03/09/2015  . OSA (obstructive sleep apnea) 07/04/2014  . Migraine without aura, with intractable migraine, so stated, without mention of status migrainosus 03/26/2014  . Peripheral neuropathy ([place]) 03/03/2014  . Abnormal stress test 02/12/2014  . CVA (cerebral infarction) 02/12/2014  . Chest pain   . Abnormal heart rhythm   . Edema   . Hypertension   . Hyperlipemia   . Hypercholesterolemia   . Other and unspecified hyperlipidemia 11/14/2013  . Type II diabetes mellitus, uncontrolled ([place]) 07/22/2013  . Essential hypertension 07/22/2013  . 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 Diabetic polyneuropathy ([place]) 07/22/2013    Current Outpatient Prescriptions on File Prior to Visit  Medication Sig Dispense Refill  . atorvastatin (LIPITOR) 40 MG tablet Take 1 tablet (40 mg total) by mouth daily at 6 PM. 90 tablet 3  . BELSOMRA 20 MG TABS Take 1 tablet by mouth daily.    . CHANTIX 0.5 MG tablet Take 1 mg by mouth daily. Reported on 05/27/2016    . chlorproMAZINE (THORAZINE) 200 MG tablet Take 400 mg by mouth at bedtime.     . diazepam (VALIUM) 10 MG tablet Take 20 mg by mouth at bedtime.     [name] esomeprazole (NEXIUM) 40 MG capsule Take 40 mg by mouth daily.     [name] gabapentin (NEURONTIN) 300 MG capsule Take 2 capsules (600 mg total) by mouth 2 (two) times daily. 90 capsule 3  . glucose blood (FREESTYLE LITE)  test strip Use as instructed to check blood sugar 3 times per day dx code E11.65 100 each 5  . HYDROcodone-acetaminophen (NORCO) 10-325 MG tablet Take 1 tablet by mouth every 6 (six) hours as needed. 30 tablet 0  . insulin aspart (NOVOLOG) 100 UNIT/ML injection Use per v-go pump 76 units daily 30 mL 2  . Insulin Disposable Pump (V-GO 40) KIT Use 1 device per day. 1 kit 3  . INVOKANA 300 MG TABS tablet TAKE (1) TABLET BY MOUTH DAILY BEFORE BREAKFAST 30 tablet 11  . Lancets (FREESTYLE) lancets Use as instructed to check blood sugar 3 times per day dx code E11.65 100 each 5  . metFORMIN (GLUCOPHAGE-XR) 500 MG 24 hr tablet Take 4 tablets (2,000 mg total) by mouth daily with supper. 120 tablet 3  . rizatriptan (MAXALT) 10 MG tablet Take 1 tablet (10 mg total) by mouth as needed for migraine. May repeat in 2 hours if needed (Patient not taking: Reported on 12/02/2016) 10 tablet 3  . spironolactone (ALDACTONE) 100 MG tablet Take 1 tablet (100 mg total) by mouth daily. 90 tablet 3  . tamsulosin (FLOMAX) 0.4 MG CAPS capsule Take 0.4 mg by mouth daily.    [name] tiZANidine (ZANAFLEX) 4 MG tablet Take 2 tablets by mouth at bedtime.    . Vortioxetine HBr 5 MG TABS Take 25 mg by mouth daily. Takes with a 20 mg tablet to equal 25 mg  Current Facility-Administered Medications on File Prior to Visit  Medication Dose Route Frequency Provider Last Rate Last Dose  . betamethasone acetate-betamethasone sodium phosphate (CELESTONE) injection 3 mg  3 mg Intramuscular Once Edrick Kins, DPM        Allergies  Allergen Reactions  . Latex Itching, Rash and Other (See Comments)    Pt states she cannot use condoms - cause an infection.  Use of latex on skin is okay.  Tape causes rash  . Sweetening Enhancer [Flavoring Agent] Nausea And Vomiting and Other (See Comments)    HEADACHES  . Tetracyclines & Related Nausea And Vomiting and Other (See Comments)    ;YEAST INFECTIONS  . Aspartame And Phenylalanine Nausea And  Vomiting    HEADACHES  . Ibuprofen Other (See Comments)    HEADACHES  . Trazodone And Nefazodone Other (See Comments)    Hallucinations   . Triazolam Other (See Comments)    HALLUCINATIONS    Objective: There were no vitals filed for this visit.  General: No acute distress, AAOx3  Right foot: Dorsal 5th toe healed with dry scab. At right medial midfoot ulceration healed with no surrounding infection or re-opening, mild swelling right foot, no streaking erythema/cellulitis, no warmth, no drainage, no gross signs of infection noted, Capillary fill time <3 seconds in all digits, gross sensation present via light touch to right foot. Protective sensation absent. 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 No pain or crepitation with range of motion right foot.  No pain with calf compression.   To left foot there is a healed sub met 1 ulceration,  no maceration, no other signs of infection. No other issues.  Assessment and Plan:  Problem List Items Addressed This Visit    None    Visit Diagnoses    Foot ulceration, left, limited to breakdown of skin ([place])    -  Primary   prematurely healed   Diabetic Charcot's foot ([place])       Right foot pain       Left foot pain       Diabetic polyneuropathy associated with type 2 diabetes mellitus ([place])         -Patient seen and evaluated -Applied antibiotic to left foot covered with bandaid dressing -No dressing on right since area remains healed -Advised patient to refrain from showers until left foot is checked by nurse to make sure left is well healed  -Continue with Gabapentin 328m qhs -Advised patient to continue with post-op shoe on right and left foot with felt padding;Completed again Safe step diabetic shoe order form; office to contact primary care for approval / certification;  Office to arrange shoe fitting and dispensing. -Advised patient to limit activity to necessity  -Advised patient to elevate as necessary  -Return 2  weeks for recheck of healed ulcers. In the meantime, patient to call office if any issues or problems arise.  TLandis Martins DPM

## 2017-01-13 ENCOUNTER — Telehealth: Payer: Self-pay | Admitting: *Deleted

## 2017-01-13 NOTE — Telephone Encounter (Addendum)
-----   Message from Landis Martins, [place] sent at 01/10/2017  9:41 PM EST ----- Regarding: Home nursing  No dressing is needed on right On left apply antibiotic cream covered with guaze and paper tape or bandaid May discontinue nursing next week if patient remains healed or improved Dr Cannon Kettle. 01/13/2017-faxed orders of 01/10/2017 to [facility].

## 2017-01-13 NOTE — Telephone Encounter (Signed)
Entered in error

## 2017-01-13 NOTE — Telephone Encounter (Signed)
-----   Message from Landis Martins, [place] sent at 01/10/2017  9:41 PM EST ----- Regarding: Home nursing  No dressing is needed on right On left apply antibiotic cream covered with guaze and paper tape or bandaid May discontinue nursing next week if patient remains healed or improved Dr Cannon Kettle

## 2017-01-17 ENCOUNTER — Ambulatory Visit: Payer: Medicare Other | Admitting: Sports Medicine

## 2017-01-24 ENCOUNTER — Encounter: Payer: Self-pay | Admitting: Sports Medicine

## 2017-01-24 ENCOUNTER — Ambulatory Visit (INDEPENDENT_AMBULATORY_CARE_PROVIDER_SITE_OTHER): Payer: Medicare Other | Admitting: Sports Medicine

## 2017-01-24 DIAGNOSIS — M79671 Pain in right foot: Secondary | ICD-10-CM | POA: Diagnosis not present

## 2017-01-24 DIAGNOSIS — L97521 Non-pressure chronic ulcer of other part of left foot limited to breakdown of skin: Secondary | ICD-10-CM | POA: Diagnosis not present

## 2017-01-24 DIAGNOSIS — E11610 Type 2 diabetes mellitus with diabetic neuropathic arthropathy: Secondary | ICD-10-CM | POA: Diagnosis not present

## 2017-01-24 DIAGNOSIS — E1142 Type 2 diabetes mellitus with diabetic polyneuropathy: Secondary | ICD-10-CM

## 2017-01-24 DIAGNOSIS — M79672 Pain in left foot: Secondary | ICD-10-CM | POA: Diagnosis not present

## 2017-01-24 NOTE — Progress Notes (Signed)
Subjective: [name] is a 51 y.o. female patient seen today in office for follow up ulcer care bilateral. Patient is POV #18 (DOS 09-05-16), S/P Right foot wound debridement with placement of graft and for follow up eval of left foot ulcer.  Patient denies current pain. Denies calf pain, denies headache, chest pain, shortness of breath, nausea, vomiting, fever, or chills. Patient has home nursing every other day. No other issues noted.   Patient Active Problem List   Diagnosis Date Noted  . Cancer of sigmoid colon ([place]) 03/09/2015  . OSA (obstructive sleep apnea) 07/04/2014  . Migraine without aura, with intractable migraine, so stated, without mention of status migrainosus 03/26/2014  . Peripheral neuropathy ([place]) 03/03/2014  . Abnormal stress test 02/12/2014  . CVA (cerebral infarction) 02/12/2014  . Chest pain   . Abnormal heart rhythm   . Edema   . Hypertension   . Hyperlipemia   . Hypercholesterolemia   . Other and unspecified hyperlipidemia 11/14/2013  . Type II diabetes mellitus, uncontrolled ([place]) 07/22/2013  . Essential hypertension 07/22/2013  . 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 Diabetic polyneuropathy ([place]) 07/22/2013    Current Outpatient Prescriptions on File Prior to Visit  Medication Sig Dispense Refill  . atorvastatin (LIPITOR) 40 MG tablet Take 1 tablet (40 mg total) by mouth daily at 6 PM. 90 tablet 3  . BELSOMRA 20 MG TABS Take 1 tablet by mouth daily.    . CHANTIX 0.5 MG tablet Take 1 mg by mouth daily. Reported on 05/27/2016    . chlorproMAZINE (THORAZINE) 200 MG tablet Take 400 mg by mouth at bedtime.     . diazepam (VALIUM) 10 MG tablet Take 20 mg by mouth at bedtime.     [name] esomeprazole (NEXIUM) 40 MG capsule Take 40 mg by mouth daily.     [name] gabapentin (NEURONTIN) 300 MG capsule Take 2 capsules (600 mg total) by mouth 2 (two) times daily. 90 capsule 3  . glucose blood (FREESTYLE  LITE) test strip Use as instructed to check blood sugar 3 times per day dx code E11.65 100 each 5  . HYDROcodone-acetaminophen (NORCO) 10-325 MG tablet Take 1 tablet by mouth every 6 (six) hours as needed. 30 tablet 0  . insulin aspart (NOVOLOG) 100 UNIT/ML injection Use per v-go pump 76 units daily 30 mL 2  . Insulin Disposable Pump (V-GO 40) KIT Use 1 device per day. 1 kit 3  . INVOKANA 300 MG TABS tablet TAKE (1) TABLET BY MOUTH DAILY BEFORE BREAKFAST 30 tablet 11  . Lancets (FREESTYLE) lancets Use as instructed to check blood sugar 3 times per day dx code E11.65 100 each 5  . metFORMIN (GLUCOPHAGE-XR) 500 MG 24 hr tablet Take 4 tablets (2,000 mg total) by mouth daily with supper. 120 tablet 3  . rizatriptan (MAXALT) 10 MG tablet Take 1 tablet (10 mg total) by mouth as needed for migraine. May repeat in 2 hours if needed (Patient not taking: Reported on 12/02/2016) 10 tablet 3  . spironolactone (ALDACTONE) 100 MG tablet Take 1 tablet (100 mg total) by mouth daily. 90 tablet 3  . tamsulosin (FLOMAX) 0.4 MG CAPS capsule Take 0.4 mg by mouth daily.    [name] tiZANidine (ZANAFLEX) 4 MG tablet Take 2 tablets by mouth at bedtime.    . Vortioxetine HBr 5 MG TABS Take 25 mg by mouth daily. Takes with a 20 mg tablet to equal 25 mg  Current Facility-Administered Medications on File Prior to Visit  Medication Dose Route Frequency Provider Last Rate Last Dose  . betamethasone acetate-betamethasone sodium phosphate (CELESTONE) injection 3 mg  3 mg Intramuscular Once Edrick Kins, DPM        Allergies  Allergen Reactions  . Latex Itching, Rash and Other (See Comments)    Pt states she cannot use condoms - cause an infection.  Use of latex on skin is okay.  Tape causes rash  . Sweetening Enhancer [Flavoring Agent] Nausea And Vomiting and Other (See Comments)    HEADACHES  . Tetracyclines & Related Nausea And Vomiting and Other (See Comments)    ;YEAST INFECTIONS  . Aspartame And Phenylalanine Nausea And  Vomiting    HEADACHES  . Ibuprofen Other (See Comments)    HEADACHES  . Trazodone And Nefazodone Other (See Comments)    Hallucinations   . Triazolam Other (See Comments)    HALLUCINATIONS    Objective: There were no vitals filed for this visit.  General: No acute distress, AAOx3  Right foot: Dorsal 5th toe healed with dry scab. At right medial midfoot ulceration healed with no surrounding infection or re-opening, mild swelling right foot, no streaking erythema/cellulitis, no warmth, no drainage, no gross signs of infection noted, Capillary fill time <3 seconds in all digits, gross sensation present via light touch to right foot. Protective sensation absent. 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 No pain or crepitation with range of motion right foot.  No pain with calf compression.   To left foot there is a healed sub met 1 ulceration with mild reactive keratosis,  no maceration, no other signs of infection. No other issues.  Assessment and Plan:  Problem List Items Addressed This Visit    None    Visit Diagnoses    Foot ulceration, left, limited to breakdown of skin ([place])    -  Primary   healed    Diabetic Charcot's foot ([place])       Right foot pain       Left foot pain       Diabetic polyneuropathy associated with type 2 diabetes mellitus ([place])         -Patient seen and evaluated -No dressings bilateral since ulcers are healed -Advised patient to shower as normal and to use mositurizing cream to dry skin areas -Continue with Gabapentin 368m qhs -Advised patient to continue with post-op shoe on right and left foot with felt padding;Patient is awaing diabetic shoes safestep form previously completed -Advised patient to limit activity to necessity  -Advised patient to elevate as necessary  -Return 3 weeks for final recheck of healed ulcers. In the meantime, patient to call office if any issues or problems arise.  TLandis Martins DPM

## 2017-02-14 ENCOUNTER — Ambulatory Visit (INDEPENDENT_AMBULATORY_CARE_PROVIDER_SITE_OTHER): Payer: Medicare Other | Admitting: Sports Medicine

## 2017-02-14 ENCOUNTER — Telehealth: Payer: Self-pay | Admitting: *Deleted

## 2017-02-14 ENCOUNTER — Encounter: Payer: Self-pay | Admitting: Sports Medicine

## 2017-02-14 DIAGNOSIS — E11610 Type 2 diabetes mellitus with diabetic neuropathic arthropathy: Secondary | ICD-10-CM

## 2017-02-14 DIAGNOSIS — E1142 Type 2 diabetes mellitus with diabetic polyneuropathy: Secondary | ICD-10-CM

## 2017-02-14 DIAGNOSIS — L97521 Non-pressure chronic ulcer of other part of left foot limited to breakdown of skin: Secondary | ICD-10-CM | POA: Diagnosis not present

## 2017-02-14 DIAGNOSIS — M79672 Pain in left foot: Secondary | ICD-10-CM | POA: Diagnosis not present

## 2017-02-14 NOTE — Telephone Encounter (Addendum)
-----   Message from [place], [place] sent at 02/14/2017  3:48 PM EST ----- Regarding: Reorder home nursing Blister to left foot apply betadine and dry dressing and to right hallux nail bed apply neosporin and bandaid. Faxed orders to [facility]. 02/15/2017-Wanda - [facility] sent fax that pt had been discharged from [facility]. I return faxed a note with demographics that we would like to reinstate pt to their [place]. 02/16/2017-Pt states she needs the diabetic shoe paperwork faxed to Dr. Tamsen Roers again at [phone]. 02/17/2017-Christina - [facility] states pt's wounds are closed and pt can dress with the betadine and neosporin.

## 2017-02-14 NOTE — Progress Notes (Signed)
Subjective: [name] is a 51 y.o. female patient seen today in office for follow up ulcer care bilateral. Patient is POV #19 (DOS 09-05-16), S/P Right foot wound debridement with placement of graft and for follow up eval of left foot ulcer; states that 1 week ago noticed blister and also lost right 1st toenail and noticed bleeding.  Patient denies current pain however has pain sometime at back of left heel with cramp. Denies calf pain, denies headache, chest pain, shortness of breath, nausea, vomiting, fever, or chills. No other issues noted.   Patient Active Problem List   Diagnosis Date Noted  . Cancer of sigmoid colon ([place]) 03/09/2015  . OSA (obstructive sleep apnea) 07/04/2014  . Migraine without aura, with intractable migraine, so stated, without mention of status migrainosus 03/26/2014  . Peripheral neuropathy ([place]) 03/03/2014  . Abnormal stress test 02/12/2014  . CVA (cerebral infarction) 02/12/2014  . Chest pain   . Abnormal heart rhythm   . Edema   . Hypertension   . Hyperlipemia   . Hypercholesterolemia   . Other and unspecified hyperlipidemia 11/14/2013  . Type II diabetes mellitus, uncontrolled ([place]) 07/22/2013  . Essential hypertension 07/22/2013  . 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 Diabetic polyneuropathy ([place]) 07/22/2013    Current Outpatient Prescriptions on File Prior to Visit  Medication Sig Dispense Refill  . atorvastatin (LIPITOR) 40 MG tablet Take 1 tablet (40 mg total) by mouth daily at 6 PM. 90 tablet 3  . BELSOMRA 20 MG TABS Take 1 tablet by mouth daily.    . CHANTIX 0.5 MG tablet Take 1 mg by mouth daily. Reported on 05/27/2016    . chlorproMAZINE (THORAZINE) 200 MG tablet Take 400 mg by mouth at bedtime.     . diazepam (VALIUM) 10 MG tablet Take 20 mg by mouth at bedtime.     [name] esomeprazole (NEXIUM) 40 MG capsule Take 40 mg by mouth daily.     [name] gabapentin (NEURONTIN) 300 MG  capsule Take 2 capsules (600 mg total) by mouth 2 (two) times daily. 90 capsule 3  . glucose blood (FREESTYLE LITE) test strip Use as instructed to check blood sugar 3 times per day dx code E11.65 100 each 5  . HYDROcodone-acetaminophen (NORCO) 10-325 MG tablet Take 1 tablet by mouth every 6 (six) hours as needed. 30 tablet 0  . insulin aspart (NOVOLOG) 100 UNIT/ML injection Use per v-go pump 76 units daily 30 mL 2  . Insulin Disposable Pump (V-GO 40) KIT Use 1 device per day. 1 kit 3  . INVOKANA 300 MG TABS tablet TAKE (1) TABLET BY MOUTH DAILY BEFORE BREAKFAST 30 tablet 11  . Lancets (FREESTYLE) lancets Use as instructed to check blood sugar 3 times per day dx code E11.65 100 each 5  . metFORMIN (GLUCOPHAGE-XR) 500 MG 24 hr tablet Take 4 tablets (2,000 mg total) by mouth daily with supper. 120 tablet 3  . rizatriptan (MAXALT) 10 MG tablet Take 1 tablet (10 mg total) by mouth as needed for migraine. May repeat in 2 hours if needed (Patient not taking: Reported on 12/02/2016) 10 tablet 3  . spironolactone (ALDACTONE) 100 MG tablet Take 1 tablet (100 mg total) by mouth daily. 90 tablet 3  . tamsulosin (FLOMAX) 0.4 MG CAPS capsule Take 0.4 mg by mouth daily.    [name] tiZANidine (ZANAFLEX) 4 MG tablet Take 2 tablets by mouth at bedtime.    . Vortioxetine HBr 5 MG  TABS Take 25 mg by mouth daily. Takes with a 20 mg tablet to equal 25 mg     Current Facility-Administered Medications on File Prior to Visit  Medication Dose Route Frequency Provider Last Rate Last Dose  . betamethasone acetate-betamethasone sodium phosphate (CELESTONE) injection 3 mg  3 mg Intramuscular Once Edrick Kins, DPM        Allergies  Allergen Reactions  . Latex Itching, Rash and Other (See Comments)    Pt states she cannot use condoms - cause an infection.  Use of latex on skin is okay.  Tape causes rash  . Sweetening Enhancer [Flavoring Agent] Nausea And Vomiting and Other (See Comments)    HEADACHES  . Tetracyclines & Related  Nausea And Vomiting and Other (See Comments)    ;YEAST INFECTIONS  . Aspartame And Phenylalanine Nausea And Vomiting    HEADACHES  . Ibuprofen Other (See Comments)    HEADACHES  . Trazodone And Nefazodone Other (See Comments)    Hallucinations   . Triazolam Other (See Comments)    HALLUCINATIONS    Objective: There were no vitals filed for this visit.  General: No acute distress, AAOx3  Right foot: 1st toenail not present with dry blood in nail bed with no surrounding infection. Dorsal 5th toe healed with dry scab. At right medial midfoot ulceration healed with no surrounding infection or re-opening, mild swelling right foot, no streaking erythema/cellulitis, no warmth, no drainage, no gross signs of infection noted, Capillary fill time <3 seconds in all digits, gross sensation present via light touch to right foot. Protective sensation absent. 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 No pain or crepitation with range of motion right foot.  No pain with calf compression.   To left foot there is a blister with dry blood medial 1st MTJ with no active drainage once debrided revealing a ulcer bed measuring 1x1cm,  no maceration, no other signs of infection. Subjective occasional pain to left heel at ach[place]. No other issues.  Assessment and Plan:  Problem List Items Addressed This Visit      Endocrine   Diabetic polyneuropathy ([place])    Other Visit Diagnoses    Foot ulceration, left, limited to breakdown of skin ([place])    -  Primary   Relevant Orders   WOUND CULTURE   Diabetic Charcot's foot ([place])       Left foot pain         -Patient seen and evaluated -Debrided blister at left 1st MTPJ and wound culture obtained will call patient if PO antibiotics are needed; applied betadine and dry dressing and reordered home nursing to do the same -Applied antibiotic cream and bandaid to right 1st toenail bed and reordered nursing to do the same -Applied padding to post op shoe on  left  -Continue with Gabapentin 366m qhs -Advised patient to continue with post-op shoe on right and left foot; Patient is awaing diabetic shoes safestep form previously completed; Awaiting PCP to sign off on paperwork  -Advised patient to limit activity to necessity  -Advised patient to elevate as necessary  -Return 2 weeks for follow up eval of ulcerations. In the meantime, patient to call office if any issues or problems arise.  TLandis Martins DPM

## 2017-02-17 NOTE — Telephone Encounter (Signed)
This is a [place] pt and I know that Dr. Cannon Kettle has filled out this pt diabetic form many times. It has stayed in the casting room file on the wall.

## 2017-02-18 LAB — WOUND CULTURE
GRAM STAIN: NONE SEEN
GRAM STAIN: NONE SEEN

## 2017-02-20 MED ORDER — CLINDAMYCIN HCL 300 MG PO CAPS: 300.0000 mg | ORAL_CAPSULE | Freq: Three times a day (TID) | ORAL | 0 refills | Status: DC

## 2017-02-20 NOTE — Telephone Encounter (Addendum)
-----   Message from Landis Martins, [place] sent at 02/20/2017  8:45 AM EST ----- Please Rx Clindamycin 300mg  tid x 10 days for positive culture and let patient know Thanks Dr. Cannon Kettle. 02/20/2017-Orders called to pt. 02/21/2017-I informed pt of Dr. Leeanne Rio 02/20/2017 6:05pm orders to dress wounds with betadine and neosporin. Pt states she know how to do the dressing changes and will see Dr. Cannon Kettle at next appt. Pt asked the status of her Diabetic shoes. I told her I had sent her last message to Dr. Leeanne Rio assistant and would send another.03/27/2017-Pt left name, DOB and phone number. I spoke with pt, she states she needs an appt as soon as possible she having a lot of pain and swelling in her left foot. I told pt I would transfer her to schedulers for an appt tomorrow.05/16/2017-Faxed Copy of Dr. Leeanne Rio 05/16/2017 orders, to [facility].

## 2017-02-20 NOTE — Telephone Encounter (Signed)
Ok have patient to continue to dress with betadine and neosporin and I will recheck her at the next office visit -Dr. Cannon Kettle

## 2017-02-21 NOTE — Telephone Encounter (Signed)
Please see Dr.Stover, I am no longer in the [place] office .

## 2017-02-23 ENCOUNTER — Encounter: Payer: Self-pay | Admitting: Neurology

## 2017-02-23 ENCOUNTER — Ambulatory Visit (INDEPENDENT_AMBULATORY_CARE_PROVIDER_SITE_OTHER): Payer: Medicare Other | Admitting: Neurology

## 2017-02-23 VITALS — BP 122/64 | HR 80 | Resp 20 | Ht 65.0 in | Wt 168.0 lb

## 2017-02-23 DIAGNOSIS — J449 Chronic obstructive pulmonary disease, unspecified: Secondary | ICD-10-CM

## 2017-02-23 DIAGNOSIS — F5105 Insomnia due to other mental disorder: Secondary | ICD-10-CM | POA: Diagnosis not present

## 2017-02-23 DIAGNOSIS — G4733 Obstructive sleep apnea (adult) (pediatric): Secondary | ICD-10-CM

## 2017-02-23 DIAGNOSIS — R4689 Other symptoms and signs involving appearance and behavior: Secondary | ICD-10-CM

## 2017-02-23 DIAGNOSIS — F3110 Bipolar disorder, current episode manic without psychotic features, unspecified: Secondary | ICD-10-CM

## 2017-02-23 NOTE — Progress Notes (Signed)
SLEEP MEDICINE CLINIC   Provider:  Larey Seat, M D  Referring Provider: Tamsen Roers, MD Primary Care Physician:  Tamsen Roers, MD  Chief Complaint  Patient presents with  . Sleep Consult    Rm 10. Patient states that she has trouble falling and staying asleep. She reports having 2 sleep studies in the past and cannot tolerate CPAP.     HPI:  [name] is a 51 y.o. female , seen here as a referral/ revisit  from Dr. Rex Kras for a sleep evaluation,  [name] presents restless, fidgety and strongly smells of tobacco smoke. She is tattoed. Has both feet in casts.  She reports that she has brittle diabetes, bilateral foot ulcers and had a fracture that didn't heal well. She further reports that 7 years ago she had been once referred for sleep study was diagnosed with sleep apnea to a very severe degree- AHI  of 53.6 per hour, associated with loud snoring but without oxygen desaturation at the time. The study was dated 02-08-10 and Dr. Tamsen Roers had referred her to the [place] [facility] at the time , the study was interpreted by Dr. Baird Lyons. She states that she could not tolerate the CPAP that was issued. Her 6 sleep apnea has remained untreated for a decade. In the meantime she has developed insomnia, restless legs, bronchitic coughing, wheezing, and leg cramps. She reports that she is just not able to go to sleep but not sure how much of it may have to do with sleep apnea. She also ports that she's not interested in starting CPAP and that she cannot stand them.  She states that she falls asleep and wakes up promptly again, that her night is separated and little short parcels of sleep. She had asked Dr. Rex Kras for medication, but he wanted to make sure that an organic sleep disorder is treated first before issuing any medications potentially making apnea worse.   Chief complaint according to patient : I cannot sleep!  Sleep habits are as follows: She states she lays  in bed all night but that doesn't mean that she sleeps. She spends most day in the bedroom, and she has a TV in her bedroom. She lives in her own private home with 3 bedrooms, and she reports that she cuts the TV off but still laid awake for hours. The patient lives alone and there is no witness to her sleep, she doesn't wake up from snoring, choking or shortness of breath is mostly broken by restlessness and anxiety, not by shortness of breath, palpitations, diaphoresis or dizziness. Not by pain. She reports that short periods of sleep do energize her . She does neither have a set bedtime nor rise time, some nights she will not go to sleep at all and may not spend the day awake.  She has bipolar diease and at this time seems manic, sleepless and pressured. She feels also very depressed now, please note the patient has a history of early colon cancer and had surgery, continues tobacco use, cyclic insomnia lasting sometimes month. Vitamin D deficiency in the past she received replacement, history of stool6, right-sided sciatica, very high triglycerides, followed by Dr. Dwyane Dee for diabetes, patient is on insulin pump is on fenofibrate for triglycerides. No tumor found, chronic hoarseness not better after antibiotics, she has a ruptured abdominal from 3 bladder surgeries. She then stated that she discontinued on her oral medications. Her psychiatrist prescribed sleep aids- Trazodone, Lunesta, Belsomra, Thorazine, Seroquel,  Cymbalta and rozerem. All failed.     Dr. Rex Kras checked her HbA1c on February 22 at 9.3, refilled her oxycodone at 60 pills 5 mg for twice a day use, she was also on Adderall, Chantix, Thorazine, diazepam, Nexium, TriCor, Lasix, insulin, Imdur,, oxycodone, Percocet, trintillix , Trulicity, Aldactone.   She had cervical fusion, cadaver graft to the left knee, has been disabled since 1994. Daughter has Angelman syndrome. Age 33.  single,  Heavy smoker 2ppd. , non compliant with  medication and with diet. Has been bipolar, disabled - unable to work. Brittle diabetic, chronic pain.    Review of Systems: Out of a complete 14 system review, the patient complains of only the following symptoms, and all other reviewed systems are negative. He reports leg spasms and an irresistible urge to move to overcome those., She always feels very tense, strained, spastic, with a lot of rigor, jitteriness movement doesn't cease. Hoarseness, depression, chronic insomnia, cyclic insomnia. Epworth score 1  , Fatigue severity score 9  , depression score  7/12   Social History   Social History  . Marital status: Divorced    Spouse name: N/A  . Number of children: 2  . Years of education: 6th   Occupational History  . Not on file.   Social History Main Topics  . Smoking status: Current Every Day Smoker    Packs/day: 2.00    Years: 41.00    Types: Cigarettes, E-cigarettes  . Smokeless tobacco: Never Used  . Alcohol use No     Comment: denies  . Drug use: No  . Sexual activity: Yes   Other Topics Concern  . Not on file   Social History Narrative  Patient is divorced and lives alone- independent in [place], Drives   Patient has two adult children- grown son and daughter who is special needs in a home    Patient is disabled since 46   Patient has a 6th grade education.   Patient is right-handed.   Depression-medication and therapist   Patient drinks 2- 3 liters of soda and drinks tea but not everyday.    Does not routinely exercise.      Patient reports that she drinks "a lot" of caffeine daily     Family History  Problem Relation Age of Onset  . Hypertension Mother   . Diabetes Mother   . Cancer - Other Mother     lymphoma   . Cancer - Other Father     lung, bladder cancer   . Heart attack Father   . Cancer - Other Brother     bladder cancer     Past Medical History:  Diagnosis Date  . Anginal pain ([place])   . Anxiety   . Arthritis    "qwhere" (02/12/2014)  . Bipolar disorder ([place])   . Cancer [facility])    colon cancer  . Chest pain    a. 2008 Cath: normal cors;  b. 12/2013 [facility] CL: EF 47%, no ischemia/infarct.  . Cholecystitis   . Chronic back pain   . Chronic pain   . Claudication ([place])    a. 12/2013 ABI's: R 0.97, L 0.94.  [name] COPD (chronic obstructive pulmonary disease) ([place])   . Depression   . Diabetic foot ulcer ([facility])    chronic  . DJD (degenerative joint disease)   . Edema   . Gastroparesis   . GERD (gastroesophageal reflux disease)   . H/O hiatal hernia   . YTKZSWFU(932.3)    "weekly" (02/12/2014)  . History of blood transfusion 1986   "related to lost a child" (02/12/2014)  . Hypercholesterolemia   . Hyperlipemia   . Hypersomnia   . Hypertension   . Insomnia   . MRSA (methicillin resistant Staphylococcus aureus)   . Neurogenic bladder   . OSA (obstructive sleep apnea) 07/04/2014  . Palpitations   . Personality disorder   . Pneumonia    "twice, I think" (02/12/2014)  . Sleep apnea    didn't tolerate cpap (02/12/2014)  . SOB (shortness of breath) on exertion   . Stroke ([place]) 02/12/2014   "eyesight is messed up; not steady on my feet" (02/12/2014)  . SUI (stress urinary incontinence, female) S/P SLING 12-29-2011  . Tobacco abuse   . Type II diabetes  mellitus ([place])   . Ulcer ([place])   . Vertigo     Past Surgical History:  Procedure Laterality Date  . ABDOMINAL HYSTERECTOMY    . ANTERIOR CERVICAL DECOMP/DISCECTOMY FUSION  2000   C6 - 7  . CARDIAC CATHETERIZATION  05-22-2008   DR SKAINS   NO SIGNIFECANT CAD/ NORMAL LV/  EF 65%/  NO WALL MOTION ABNORMALITIES  . CARDIAC CATHETERIZATION  02/12/2014  . CARPAL TUNNEL RELEASE Right 04-25-2013  . [place]; 1992  . CYSTO N/A 04/30/2013   Procedure: CYSTOSCOPY;  Surgeon: Reece Packer, MD;  Location: [facility];  Service: Urology;  Laterality: N/A;  . CYSTOSCOPY WITH INJECTION  05/04/2012   Procedure: CYSTOSCOPY WITH INJECTION;  Surgeon: Reece Packer, MD;  Location: [place] [facility];  Service: Urology;  Laterality: N/A;  MACROPLASTIQUE INJECTION  . CYSTOSCOPY WITH INJECTION  08/28/2012   Procedure: CYSTOSCOPY WITH INJECTION;  Surgeon: Reece Packer, MD;  [facility];  Service: Urology;  Laterality: N/A;  cysto and macroplastique   . FOOT SURGERY Bilateral   . HERNIA REPAIR  ?1996   "stomach"  . KNEE ARTHROSCOPY W/ ALLOGRAFT IMPANT Left    graft x 2  . KNEE SURGERY     TOTAL 8 SURG'S  . LAPAROSCOPIC SIGMOID COLECTOMY  04/22/2015  . LAPAROSCOPIC SIGMOID COLECTOMY N/A 04/22/2015   Procedure: LAPAROSCOPIC HAND ASSISTED SIGMOID COLECTOMY;  Surgeon: Erroll Luna, MD;  Location: [place];  Service: General;  Laterality: N/A;  . LEFT HEART CATHETERIZATION WITH CORONARY ANGIOGRAM N/A 02/12/2014   Procedure: LEFT HEART CATHETERIZATION WITH CORONARY ANGIOGRAM;  Surgeon: Candee Furbish, MD;  Location[facility];  Service: Cardiovascular;  Laterality: N/A;  . LUMBAR FUSION     "cage in my spine"  . MULTIPLE LAPAROSCOPIES FOR ENDOMETRIOSIS    . PUBOVAGINAL SLING  12/29/2011   Procedure: Gaynelle Arabian;  Surgeon: Reece Packer, MD;  Location[facility];  Service: Urology;  Laterality: N/A;  cysto and sparc sling   . PUBOVAGINAL  SLING N/A 04/30/2013   Procedure: REMOVAL OF VAGINAL MESH;  Surgeon: Reece Packer, MD;  Location: [facility];  Service: Urology;  Laterality: N/A;  . RECONSTURCTION OF CONGENITAL UTERUS ANOMALY  1983  . REPEAT RECONSTRUCTION ACL LEFT KNEE/ SCREWS REMOVED  03-28-2000   CADAVER GRAFT  . TOTAL ABDOMINAL HYSTERECTOMY W/ BILATERAL SALPINGOOPHORECTOMY  1997  . WOUND DEBRIDEMENT Right 09/05/2016   Procedure: DEBRIDEMENT WOUND WITH GRAFT RIGHT FOOT;  Surgeon: Landis Martins, DPM;  Location: [place];  Service: Podiatry;  Laterality: Right;    Current Outpatient Prescriptions  Medication Sig Dispense Refill  . oxyCODONE (OXY IR/ROXICODONE) 5 MG immediate release tablet     . oxyCODONE-acetaminophen (PERCOCET) 10-325 MG tablet      Current Facility-Administered Medications  Medication Dose Route Frequency Provider Last Rate Last Dose  . betamethasone acetate-betamethasone sodium phosphate (CELESTONE) injection 3 mg  3 mg Intramuscular Once Edrick Kins, DPM        Allergies as of 02/23/2017 - Review Complete 02/23/2017  Allergen Reaction Noted  . Latex Itching, Rash, and Other (See Comments) 05/04/2012  . Sweetening enhancer [flavoring agent] Nausea And Vomiting and Other (See Comments) 12/22/2011  . Tetracyclines & related Nausea And Vomiting and Other (See Comments) 01/14/2014  . Aspartame and phenylalanine Nausea And Vomiting 01/14/2014  . Ibuprofen Other (See Comments) 12/22/2011  . Trazodone and nefazodone Other (See Comments) 04/26/2013  . Triazolam Other (See Comments) 12/22/2011    Vitals: BP 122/64   Pulse 80   Resp 20   Ht 5\' 5"  (1.651 m)   Wt 168 lb (76.2 kg)   BMI 27.96 kg/m  Last Weight:  Wt Readings from Last 1 Encounters:  02/23/17 168 lb (76.2 kg)   OVF:IEPP mass index is 27.96 kg/m.     Last Height:   Ht Readings from Last 1 Encounters:  02/23/17 5\' 5"  (1.651 m)    Physical exam:  General: The patient is awake, alert and appears not in acute distress. The patient  is not well  groomed. Head: Normocephalic, atraumatic. Neck is supple. Mallampati 3 , tongue bitemarks,  neck circumference:14. Nasal airflow patent.  Retrognathia is not seen.  Cardiovascular:  Regular rate and rhythm , without  murmurs or carotid bruit, and without distended neck veins. Respiratory: Lungs are clear to auscultation. Skin:  tattooed , no evidence of edema, or rash Trunk:  BMI is 28 Neurologic exam : The patient is awake and alert, oriented to place and time.   Attention span & concentration ability appears impaired, she is always moving, talks with a pressured speech pattern , her voice is hoarse. Coughing   Cranial nerves: Pupils are equal and briskly reactive to light. Funduscopic exam without evidence of pallor or edema. Extraocular movements  in vertical and horizontal planes intact and without nystagmus. Visual fields by finger perimetry are intact.Hearing to finger rub intact. Facial autonomic movements, orofacial dyskinesia? Facial sensation intact to fine touch. Facial motor strength is symmetric and tongue and uvula move midline. Shoulder shrug was symmetrical.   Motor exam:  Abnormal  tone, failure of relaxation.  Sensory:  Fine touch, pinprick and vibration were tested as normal.  Coordination:  Finger-to-nose maneuver  - there is dysmetria, tremor, ataxia. Gait and station: Patient walks without assistive device and strength within normal limits.  Stance is stable and normal.  T  Deep tendon reflexes: in the  upper and lower extremities are symmetric,   The patient was advised of the nature of the diagnosed sleep disorder , the treatment options and risks for general a health and wellness arising from not treating the condition.  I spent more than 45 minutes of face to face time with the patient. Greater than 50% of time was spent in counseling and coordination of care. We have discussed the diagnosis and differential and I answered the patient's questions.      Assessment:  After physical and neurologic examination, review of laboratory studies,  Personal review of imaging studies, reports of other /same  Imaging studies ,  Results of polysomnography/ neurophysiology testing and pre-existing records as far as provided in visit., my assessment is   1) Bipolar patient in insomnia phase, with pressured speech, unable to stop moving, Akathesia? She has also reported having had periods of severe depression where she became sleepier but none in a long time. She discontinued her oral medications, her psychiatrist had recommended medications to her, but she has not taken those. She felt that they didn't work in inducing sleep.  2) I reviewed her old sleep study which had a surprising degree of sleep apnea noted in comparison to her body mass index. She has dropped 7 points on her BMI from 35-28, and is very unlikely that she would have the same degree of sleep apnea with her current weight. On the other hand she may have developed COPD overlap with residual apnea. However she has no hypersomnia and she is not able to go to sleep and therefore a sleep study has no yield. The patient has to be able to sleep at least 4 hours before we can have a valid sleep study interpretation. What I would like for [name] is to return to her psychiatrist I want her to receive her medications that make sleep much more likely for her I suggest to see her within one or 2 weeks after she has been able to initiate sleep for at least 4 hours at night.  I cannot provide additional care as of now.   Plan:  Treatment plan and additional workup :  Referral to behavioral health, Dr Casimiro Needle . She cannot see Dr. Dorethea Clan.  [facility]  [facility].  Rv prn.    Asencion Partridge Hasten Sweitzer MD  02/23/2017   CC: Tamsen Roers, [place], [place] [id number]

## 2017-02-23 NOTE — Patient Instructions (Signed)
Please remember to try to maintain good sleep hygiene, which means: Keep a regular sleep and wake schedule, try not to exercise or have a meal within 2 hours of your bedtime, try to keep your bedroom conducive for sleep, that is, cool and dark, without light distractors such as an illuminated alarm clock, and refrain from watching TV right before sleep or in the middle of the night and do not keep the TV or radio on during the night. Also, try not to use or play on electronic devices at bedtime, such as your cell phone, tablet PC or laptop. If you like to read at bedtime on an electronic device, try to dim the background light as much as possible. Do not eat in the middle of the night.   For chronic insomnia, you are best followed by a psychiatrist and/or sleep psychologist.    

## 2017-02-27 ENCOUNTER — Ambulatory Visit: Payer: Medicare Other | Admitting: Endocrinology

## 2017-02-27 ENCOUNTER — Telehealth: Payer: Self-pay | Admitting: Endocrinology

## 2017-02-27 NOTE — Telephone Encounter (Signed)
Follow up necessary. Contact patient and schedule visit in 7-10 days

## 2017-02-27 NOTE — Telephone Encounter (Signed)
Patient no showed today's appt. Please advise on how to follow up. °A. No follow up necessary. °B. Follow up urgent. Contact patient immediately. °C. Follow up necessary. Contact patient and schedule visit in ___ days. °D. Follow up advised. Contact patient and schedule visit in ____weeks. ° °

## 2017-02-28 ENCOUNTER — Ambulatory Visit (INDEPENDENT_AMBULATORY_CARE_PROVIDER_SITE_OTHER): Payer: Medicare Other | Admitting: Sports Medicine

## 2017-02-28 ENCOUNTER — Encounter: Payer: Self-pay | Admitting: Sports Medicine

## 2017-02-28 DIAGNOSIS — M79672 Pain in left foot: Secondary | ICD-10-CM

## 2017-02-28 DIAGNOSIS — E11610 Type 2 diabetes mellitus with diabetic neuropathic arthropathy: Secondary | ICD-10-CM

## 2017-02-28 DIAGNOSIS — E1142 Type 2 diabetes mellitus with diabetic polyneuropathy: Secondary | ICD-10-CM

## 2017-02-28 DIAGNOSIS — M79671 Pain in right foot: Secondary | ICD-10-CM

## 2017-02-28 NOTE — Progress Notes (Signed)
Patient met with Liliane Channel and was casted for custom molded diabetic shoes and inserts. Patient to return PUDS. Previous ulcer areas currently healed. Continue with post op shoes until custom diabetic shoes arrive. -Dr. Cannon Kettle

## 2017-03-02 NOTE — Telephone Encounter (Signed)
scheduled

## 2017-03-02 NOTE — Telephone Encounter (Signed)
Patient want to let Dr Dwyane Dee she have not taken any insulin for two month, she is tried of being diabetic and wearing the pump she stated.

## 2017-03-17 ENCOUNTER — Encounter: Payer: Self-pay | Admitting: Endocrinology

## 2017-03-17 ENCOUNTER — Ambulatory Visit (INDEPENDENT_AMBULATORY_CARE_PROVIDER_SITE_OTHER): Payer: Medicare Other | Admitting: Endocrinology

## 2017-03-17 VITALS — BP 126/88 | HR 98 | Ht 65.0 in | Wt 167.0 lb

## 2017-03-17 DIAGNOSIS — E1165 Type 2 diabetes mellitus with hyperglycemia: Secondary | ICD-10-CM

## 2017-03-17 DIAGNOSIS — Z794 Long term (current) use of insulin: Secondary | ICD-10-CM

## 2017-03-17 LAB — POCT GLUCOSE (DEVICE FOR HOME USE): GLUCOSE FASTING, POC: 548 mg/dL — AB (ref 70–99)

## 2017-03-17 NOTE — Progress Notes (Signed)
Patient ID: [name], female   DOB: Jan 22, 1966, 51 y.o.   MRN: [id number]   Reason for Appointment: Diabetes follow-up    History of Present Illness:   Diagnosis: Type 2 diabetes mellitus, date of diagnosis: 2004.  PAST history: She has been treated mostly with insulin since about a year after diagnosis. She has had difficulty with consistent compliance with diet and also compliance with self care including glucose monitoring over the years. She had been mostly treated with basal insulin. Also had been tried on mealtime insulin but she would be noncompliant with this. Was tried on Prandin for mealtime control but difficult to judge efficacy because of lack of postprandial monitoring. Was given Victoza to start in 2011 but did not follow up after this.She was tried on premixed insulin but this did not help her control, mostly because of noncompliance with the doses. She had been using the V- go pump and had better control initially and was better compliant with the daily routine and boluses. She has had frequent education visits also. She stopped using her V.-go pump in 6/15 because of discomfort at the site of application Also did not improve with a trial of Victoza   RECENT history:    INSULIN dose: None Non-insulin hypoglycemic drugs: None  She has gone off insulin for the last 3-4 weeks She also has not taken Invokana Has not checked her blood sugar She says that she does not want to take any treatment for her diabetes This is despite her getting more thirst and dry mouth and frequent urination Blood sugar 548 in the office today  Glucometer:  FreeStyle. not Checking blood sugar  Blood Glucose readings:    Hypoglycemia frequency: Never.    Food preferences: eating 1 or 2 meals per day, variable intake, sandwiches at times or otherwise snacks, dinner usually 8 PM  Physical activity: exercise: Minimal.  She has difficulties with leg pain Certified Diabetes Educator  visit: Most recent:, 2/14.   The diet that the patient has been following is no specific diet; still eating at irregular times; sometimes drinking drinks with sugar    Wt Readings from Last 3 Encounters:  03/17/17 167 lb (75.8 kg)  02/23/17 168 lb (76.2 kg)  12/28/16 172 lb 9.6 oz (78.3 kg)   DM labs:   Lab Results  Component Value Date   HGBA1C 10.1 12/28/2016   HGBA1C 9.7 (H) 09/05/2016   HGBA1C 10.2 (H) 05/25/2016   Lab Results  Component Value Date   MICROALBUR 0.4 10/19/2016   LDLCALC 41 01/01/2016   CREATININE 0.76 12/28/2016       Other active problems: See review of systems    Allergies as of 03/17/2017      Reactions   Latex Itching, Rash, Other (See Comments)   Pt states she cannot use condoms - cause an infection.  Use of latex on skin is okay.  Tape causes rash   Sweetening Enhancer [flavoring Agent] Nausea And Vomiting, Other (See Comments)   HEADACHES   Tetracyclines & Related Nausea And Vomiting, Other (See Comments)   ;YEAST INFECTIONS   Aspartame And Phenylalanine Nausea And Vomiting   HEADACHES   Ibuprofen Other (See Comments)   HEADACHES   Trazodone And Nefazodone Other (See Comments)   Hallucinations   Triazolam Other (See Comments)   HALLUCINATIONS      Medication List       Accurate as of 03/17/17  9:18 AM. Always use your most recent med  list.          oxyCODONE 5 MG immediate release tablet Commonly known as:  Oxy IR/ROXICODONE   oxyCODONE-acetaminophen 10-325 MG tablet Commonly known as:  PERCOCET       Allergies:  Allergies  Allergen Reactions  . Latex Itching, Rash and Other (See Comments)    Pt states she cannot use condoms - cause an infection.  Use of latex on skin is okay.  Tape causes rash  . Sweetening Enhancer [Flavoring Agent] Nausea And Vomiting and Other (See Comments)    HEADACHES  . Tetracyclines & Related Nausea And Vomiting and Other (See Comments)    ;YEAST INFECTIONS  . Aspartame And Phenylalanine  Nausea And Vomiting    HEADACHES  . Ibuprofen Other (See Comments)    HEADACHES  . Trazodone And Nefazodone Other (See Comments)    Hallucinations   . Triazolam Other (See Comments)    HALLUCINATIONS    Past Medical History:  Diagnosis Date  . Anginal pain ([place])   . Anxiety   . Arthritis    "qwhere" (02/12/2014)  . Bipolar disorder ([place])   . Cancer [facility])    colon cancer  . Chest pain    a. 2008 Cath: normal cors;  b. 12/2013 [facility] CL: EF 47%, no ischemia/infarct.  . Cholecystitis   . Chronic back pain   . Chronic pain   . Claudication ([place])    a. 12/2013 ABI's: R 0.97, L 0.94.  [name] COPD (chronic obstructive pulmonary disease) ([place])   . Depression   . Diabetic foot ulcer (HCC)    chronic  . DJD (degenerative joint disease)   . Edema   . Gastroparesis   . GERD (gastroesophageal reflux disease)   . H/O hiatal hernia   . WGYKZLDJ(570.1)    "weekly" (02/12/2014)  . History of blood transfusion 1986   "related to lost a child" (02/12/2014)  . Hypercholesterolemia   . Hyperlipemia   . Hypersomnia   . Hypertension   . Insomnia   . MRSA (methicillin resistant Staphylococcus aureus)   . Neurogenic bladder   . OSA (obstructive sleep apnea) 07/04/2014  . Palpitations   . Personality disorder   . Pneumonia    "twice, I think" (02/12/2014)  . Sleep apnea    didn't tolerate cpap (02/12/2014)  . SOB (shortness of breath) on exertion   . Stroke ([place]) 02/12/2014   "eyesight is messed up; not steady on my feet" (02/12/2014)  . SUI (stress urinary incontinence, female) S/P SLING 12-29-2011  . Tobacco abuse   . Type II diabetes mellitus ([place])   . Ulcer ([place])   . Vertigo     Past Surgical History:  Procedure Laterality Date  . ABDOMINAL HYSTERECTOMY    . ANTERIOR CERVICAL DECOMP/DISCECTOMY FUSION  2000   C6 - 7  . CARDIAC CATHETERIZATION  05-22-2008   DR SKAINS   NO SIGNIFECANT CAD/ NORMAL LV/  EF 65%/  NO WALL MOTION ABNORMALITIES  . CARDIAC CATHETERIZATION  02/12/2014  . CARPAL  TUNNEL RELEASE Right 04-25-2013  . [place]; 1992  . CYSTO N/A 04/30/2013   Procedure: CYSTOSCOPY;  Surgeon: Reece Packer, MD;  Location: [facility];  Service: Urology;  Laterality: N/A;  . CYSTOSCOPY WITH INJECTION  05/04/2012   Procedure: CYSTOSCOPY WITH INJECTION;  Surgeon: Reece Packer, MD;  Location: [place];  Service: Urology;  Laterality: N/A;  MACROPLASTIQUE INJECTION  . CYSTOSCOPY WITH INJECTION  08/28/2012   Procedure: CYSTOSCOPY WITH INJECTION;  Surgeon: Reece Packer, MD;  Location[facility];  Service: Urology;  Laterality: N/A;  cysto and macroplastique   . FOOT SURGERY Bilateral   . HERNIA REPAIR  ?1996   "stomach"  . KNEE ARTHROSCOPY W/ ALLOGRAFT IMPANT Left    graft x 2  . KNEE SURGERY     TOTAL 8 SURG'S  . LAPAROSCOPIC SIGMOID COLECTOMY  04/22/2015  . LAPAROSCOPIC SIGMOID COLECTOMY N/A 04/22/2015   Procedure: LAPAROSCOPIC HAND ASSISTED SIGMOID COLECTOMY;  Surgeon: Erroll Luna, MD;  Location: [place];  Service: General;  Laterality: N/A;  . LEFT HEART CATHETERIZATION WITH CORONARY ANGIOGRAM N/A 02/12/2014   Procedure: LEFT HEART CATHETERIZATION WITH CORONARY ANGIOGRAM;  Surgeon: Candee Furbish, MD;  Location[facility] CATH LAB;  Service: Cardiovascular;  Laterality: N/A;  . LUMBAR FUSION     "cage in my spine"  . MULTIPLE LAPAROSCOPIES FOR ENDOMETRIOSIS    . PUBOVAGINAL SLING  12/29/2011   Procedure: Gaynelle Arabian;  Surgeon: Reece Packer, MD;  Location: [facility];  Service: Urology;  Laterality: N/A;  cysto and sparc sling   . PUBOVAGINAL SLING N/A 04/30/2013   Procedure: REMOVAL OF VAGINAL MESH;  Surgeon: Reece Packer, MD;  Location: [facility];  Service: Urology;  Laterality: N/A;  . RECONSTURCTION OF CONGENITAL UTERUS ANOMALY  1983  . REPEAT RECONSTRUCTION ACL LEFT KNEE/ SCREWS REMOVED  03-28-2000   CADAVER GRAFT  . TOTAL ABDOMINAL HYSTERECTOMY W/ BILATERAL SALPINGOOPHORECTOMY  1997  . WOUND  DEBRIDEMENT Right 09/05/2016   Procedure: DEBRIDEMENT WOUND WITH GRAFT RIGHT FOOT;  Surgeon: Landis Martins, DPM;  Location: [place];  Service: Podiatry;  Laterality: Right;    Family History  Problem Relation Age of Onset  . Hypertension Mother   . Diabetes Mother   . Cancer - Other Mother     lymphoma   . Cancer - Other Father     lung, bladder cancer   . Heart attack Father   . Cancer - Other Brother     bladder cancer     Social History:  reports that she has been smoking Cigarettes and E-cigarettes.  She has a 82.00 pack-year smoking history. She has never used smokeless tobacco. She reports that she does not drink alcohol or use drugs.  Review of Systems -   DEPRESSION: Not on treatment, Has difficulty sleeping and is refusing treatment, does not want to take anything even though I offered to prescribe the medication to get her started  She has history of high triglycerides treated with fenofibrate   Currently she is taking Lipitor 40 mg for high LDL  Lab Results  Component Value Date   CHOL 111 12/28/2016   HDL 34.10 (L) 12/28/2016   LDLCALC 41 01/01/2016   LDLDIRECT 46.0 12/28/2016   TRIG 291.0 (H) 12/28/2016   CHOLHDL 3 12/28/2016    Hypertension: This has been Previously controlled with spironolactone and she is not taking this now  HYPOKALEMIA:  Her last potassium was Normal, not on any supplements   Lab Results  Component Value Date   CREATININE 0.76 12/28/2016   BUN 10 12/28/2016   NA 133 (L) 12/28/2016   K 4.1 12/28/2016   CL 97 12/28/2016   CO2 24 12/28/2016    Painful neuropathy: She has had persistent symptoms including leg pains and numbness as well as difficulty with balance  Previously has tried various drugs including gabapentin without much relief  Foot exam Last showed absent pedal pulses and distal sensation Followed by  podiatrist, last seen this month, using diabetic shoes    Examination:   BP 126/88   Pulse 98   Ht 5\' 5"  (1.651 m)    Wt 167 lb (75.8 kg)   SpO2 97%   BMI 27.79 kg/m   Body mass index is 27.79 kg/m.     Assesment/Plan:   Diabetes type 2, with persistently poor control, BMI 35 See history of present illness for detailed discussion of current diabetes management, blood sugar patterns and problems identified  She has had poor control and now and not on any treatment Patient refuses all treatment despite informing her that she will likely get into a diabetic coma with progressively high blood sugars She also refuses to go to the emergency room for hydration today  Depression: Offered her that she can be admitted to behavioral health for treatment but she refuses, family member not available on the telephone at her home number today  Patient will sign a note saying that she refuses treatment  There are no Patient Instructions on file for this visit.      Tamora Huneke 03/17/2017, 9:18 AM

## 2017-03-17 NOTE — Telephone Encounter (Signed)
LM for pt's PCP to call back to inform them of the incident that occurred today in the office

## 2017-03-17 NOTE — Addendum Note (Signed)
Addended by: Nile Riggs on: 03/17/2017 05:21 PM   Modules accepted: Orders

## 2017-03-17 NOTE — Addendum Note (Signed)
Addended by: Nile Riggs on: 03/17/2017 11:52 AM   Modules accepted: Orders

## 2017-03-28 ENCOUNTER — Encounter: Payer: Self-pay | Admitting: Sports Medicine

## 2017-03-28 ENCOUNTER — Ambulatory Visit (INDEPENDENT_AMBULATORY_CARE_PROVIDER_SITE_OTHER): Payer: Medicare Other

## 2017-03-28 ENCOUNTER — Ambulatory Visit (INDEPENDENT_AMBULATORY_CARE_PROVIDER_SITE_OTHER): Payer: Medicare Other | Admitting: Sports Medicine

## 2017-03-28 VITALS — BP 137/78 | HR 90 | Resp 16

## 2017-03-28 DIAGNOSIS — M79672 Pain in left foot: Secondary | ICD-10-CM

## 2017-03-28 DIAGNOSIS — L97521 Non-pressure chronic ulcer of other part of left foot limited to breakdown of skin: Secondary | ICD-10-CM | POA: Diagnosis not present

## 2017-03-28 DIAGNOSIS — E1142 Type 2 diabetes mellitus with diabetic polyneuropathy: Secondary | ICD-10-CM

## 2017-03-28 DIAGNOSIS — E11610 Type 2 diabetes mellitus with diabetic neuropathic arthropathy: Secondary | ICD-10-CM

## 2017-03-28 DIAGNOSIS — M779 Enthesopathy, unspecified: Secondary | ICD-10-CM

## 2017-03-28 MED ORDER — TRAMADOL HCL 50 MG PO TABS: 50.0000 mg | ORAL_TABLET | Freq: Three times a day (TID) | ORAL | 0 refills | Status: DC | PRN

## 2017-03-28 NOTE — Progress Notes (Signed)
Subjective: [name] is a 51 y.o. female patient seen today in office for follow up left foot pain at ankle and ulceration on left x 1 week that started after a trip to the beach. Patient is POV #20 (DOS 09-05-16), S/P Right foot wound debridement with placement of graft, that remains healed.  Patient reports that she needs something for the pain at left ankle and notices foot turning out and is worried. Denies calf pain, denies headache, chest pain, shortness of breath, nausea, vomiting, fever, or chills. No other issues noted.   Patient Active Problem List   Diagnosis Date Noted  . Cancer of sigmoid colon ([place]) 03/09/2015  . OSA (obstructive sleep apnea) 07/04/2014  . Migraine without aura, with intractable migraine, so stated, without mention of status migrainosus 03/26/2014  . Peripheral neuropathy ([place]) 03/03/2014  . Abnormal stress test 02/12/2014  . CVA (cerebral infarction) 02/12/2014  . Chest pain   . Abnormal heart rhythm   . Edema   . Hypertension   . Hyperlipemia   . Hypercholesterolemia   . Other and unspecified hyperlipidemia 11/14/2013  . Type II diabetes mellitus, uncontrolled ([place]) 07/22/2013  . Essential hypertension 07/22/2013  . 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 Diabetic polyneuropathy ([place]) 07/22/2013    Current Outpatient Prescriptions on File Prior to Visit  Medication Sig Dispense Refill  . oxyCODONE (OXY IR/ROXICODONE) 5 MG immediate release tablet     . oxyCODONE-acetaminophen (PERCOCET) 10-325 MG tablet      Current Facility-Administered Medications on File Prior to Visit  Medication Dose Route Frequency Provider Last Rate Last Dose  . betamethasone acetate-betamethasone sodium phosphate (CELESTONE) injection 3 mg  3 mg Intramuscular Once Edrick Kins, DPM        Allergies  Allergen Reactions  . Latex Itching, Rash and Other (See Comments)    Pt states she cannot use  condoms - cause an infection.  Use of latex on skin is okay.  Tape causes rash  . Sweetening Enhancer [Flavoring Agent] Nausea And Vomiting and Other (See Comments)    HEADACHES  . Tetracyclines & Related Nausea And Vomiting and Other (See Comments)    ;YEAST INFECTIONS  . Aspartame And Phenylalanine Nausea And Vomiting    HEADACHES  . Ibuprofen Other (See Comments)    HEADACHES  . Trazodone And Nefazodone Other (See Comments)    Hallucinations   . Triazolam Other (See Comments)    HALLUCINATIONS    Objective: There were no vitals filed for this visit.  General: No acute distress, AAOx3 however patient is having a difficult time recalling history of surgery and past injections for pain and is assisted by a friend and keep insisting on needing something for pain in left foot  Right foot:  At right medial midfoot ulceration healed with no surrounding infection or re-opening, mild swelling right foot, no streaking erythema/cellulitis, no warmth, no drainage, no gross signs of infection noted, Capillary fill time <3 seconds in all digits, gross sensation present via light touch to right foot. Protective sensation absent. 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 No pain or crepitation with range of motion right foot.  No pain with calf compression.   To left foot there is partial thickness ulceration with granular base that measures <1cm at1st MTJ with no active drainage ,  no maceration, no other signs of infection. Subjective occasional pain to left medial ankle along PT tendon with planus changes. No  reproducible pain to palpation. No other issues.  Xray left foot- Pes planus and unchanged areas of arthritis. No acute fracture or dislocation. No significant soft tissue swelling. No other acute findings.   Assessment and Plan:  Problem List Items Addressed This Visit    None    Visit Diagnoses    Foot pain, left    -  Primary   Relevant Orders   DG Foot Complete Left    Diabetic Charcot's foot (HCC)       Relevant Medications   traMADol (ULTRAM) 50 MG tablet   Diabetic polyneuropathy associated with type 2 diabetes mellitus (HCC)       Foot ulceration, left, limited to breakdown of skin (HCC)       Tendonitis         -Patient seen and evaluated -Debrided ulceration at left 1st MTPJ using sterile tissue nipper in an excisional fashion to healthy bleeding  -Applied antibiotic cream and bandaid to left 1st MTPJ and advised patient to do the same since she does not want home nursing again because of cost -Patient is refusing steroid injection at left medial ankle -Rx Tramadol for left foot pain -Continue with Gabapentin 300mg  qhs, may increase to total of 900mg  qhs -Advised patient to continue with post-op shoe on right and left foot; Patient is awaing diabetic shoes; Should arrive in 3 weeks per [place] patient to limit activity to necessity  -Advised patient to elevate as necessary  -Return 3 weeks for follow up eval of left foot ulcer. In the meantime, patient to call office if any issues or problems arise.  Landis Martins, DPM

## 2017-04-11 ENCOUNTER — Other Ambulatory Visit: Payer: Medicare Other

## 2017-04-18 ENCOUNTER — Ambulatory Visit (INDEPENDENT_AMBULATORY_CARE_PROVIDER_SITE_OTHER): Payer: Medicare Other | Admitting: Sports Medicine

## 2017-04-18 ENCOUNTER — Encounter: Payer: Self-pay | Admitting: Sports Medicine

## 2017-04-18 DIAGNOSIS — L97521 Non-pressure chronic ulcer of other part of left foot limited to breakdown of skin: Secondary | ICD-10-CM

## 2017-04-18 DIAGNOSIS — E1142 Type 2 diabetes mellitus with diabetic polyneuropathy: Secondary | ICD-10-CM | POA: Diagnosis not present

## 2017-04-18 DIAGNOSIS — M79672 Pain in left foot: Secondary | ICD-10-CM

## 2017-04-18 DIAGNOSIS — L89899 Pressure ulcer of other site, unspecified stage: Secondary | ICD-10-CM

## 2017-04-18 DIAGNOSIS — L84 Corns and callosities: Secondary | ICD-10-CM | POA: Diagnosis not present

## 2017-04-18 DIAGNOSIS — M14671 Charcot's joint, right ankle and foot: Secondary | ICD-10-CM | POA: Diagnosis not present

## 2017-04-18 DIAGNOSIS — M79671 Pain in right foot: Secondary | ICD-10-CM

## 2017-04-18 DIAGNOSIS — E11610 Type 2 diabetes mellitus with diabetic neuropathic arthropathy: Secondary | ICD-10-CM

## 2017-04-18 DIAGNOSIS — M779 Enthesopathy, unspecified: Secondary | ICD-10-CM

## 2017-04-18 MED ORDER — DICLOFENAC SODIUM 75 MG PO TBEC: 75.0000 mg | DELAYED_RELEASE_TABLET | Freq: Two times a day (BID) | ORAL | 1 refills | Status: DC

## 2017-04-18 NOTE — Progress Notes (Signed)
Subjective: [name] is a 51 y.o. female patient seen today in office for follow up left foot pain at ankle and ulceration on left currently using antibiotic cream dressing to area daily at ulcer site.  Patient reports that she needs something for the pain at left foot and ankle and that "something doesn't feel right in foot". Hurts as day goes on. Denies calf pain, denies headache, chest pain, shortness of breath, nausea, vomiting, fever, or chills. No other issues noted.   Patient Active Problem List   Diagnosis Date Noted  . Cancer of sigmoid colon ([place]) 03/09/2015  . OSA (obstructive sleep apnea) 07/04/2014  . Migraine without aura, with intractable migraine, so stated, without mention of status migrainosus 03/26/2014  . Peripheral neuropathy 03/03/2014  . Abnormal stress test 02/12/2014  . CVA (cerebral infarction) 02/12/2014  . Chest pain   . Abnormal heart rhythm   . Edema   . Hypertension   . Hyperlipemia   . Hypercholesterolemia   . Other and unspecified hyperlipidemia 11/14/2013  . Type II diabetes mellitus, uncontrolled ([place]) 07/22/2013  . Essential hypertension 07/22/2013  . 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 Diabetic polyneuropathy ([place]) 07/22/2013    Current Outpatient Prescriptions on File Prior to Visit  Medication Sig Dispense Refill  . oxyCODONE (OXY IR/ROXICODONE) 5 MG immediate release tablet     . oxyCODONE-acetaminophen (PERCOCET) 10-325 MG tablet     . traMADol (ULTRAM) 50 MG tablet Take 1 tablet (50 mg total) by mouth every 8 (eight) hours as needed. 30 tablet 0   Current Facility-Administered Medications on File Prior to Visit  Medication Dose Route Frequency Provider Last Rate Last Dose  . betamethasone acetate-betamethasone sodium phosphate (CELESTONE) injection 3 mg  3 mg Intramuscular Once Edrick Kins, DPM        Allergies  Allergen Reactions  . Latex Itching, Rash  and Other (See Comments)    Pt states she cannot use condoms - cause an infection.  Use of latex on skin is okay.  Tape causes rash  . Sweetening Enhancer [Flavoring Agent] Nausea And Vomiting and Other (See Comments)    HEADACHES  . Tetracyclines & Related Nausea And Vomiting and Other (See Comments)    ;YEAST INFECTIONS  . Aspartame And Phenylalanine Nausea And Vomiting    HEADACHES  . Ibuprofen Other (See Comments)    HEADACHES  . Trazodone And Nefazodone Other (See Comments)    Hallucinations   . Triazolam Other (See Comments)    HALLUCINATIONS    Objective: There were no vitals filed for this visit.  General: No acute distress, AAOx3 however patient is having a difficult time recalling history of surgery and past injections for pain and is assisted by a friend and keep insisting on needing something for pain in left foot and if fidigiting   Right foot:  At right medial midfoot ulceration healed with no surrounding infection or re-opening, mild swelling right foot, no streaking erythema/cellulitis, no warmth, no drainage, no gross signs of infection noted, Capillary fill time <3 seconds in all digits, gross sensation present via light touch to right foot. Protective sensation absent. 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 No pain or crepitation with range of motion right foot.  No pain with calf compression.   To left foot there is partial thickness ulceration with granular base that measures <1cm at1st MTJ with no active drainage ,  no maceration, no other signs  of infection. Subjective occasional pain to left medial ankle along PT tendon with planus changes. No reproducible pain to palpation however subjective pain by navicular . No other issues.  Assessment and Plan:  Problem List Items Addressed This Visit      Endocrine   Diabetic polyneuropathy ([place])    Other Visit Diagnoses    Foot ulceration, left, limited to breakdown of skin ([place])    -  Primary   Foot  pain, left       Diabetic Charcot's foot ([place])       Relevant Medications   diclofenac (VOLTAREN) 75 MG EC tablet   Tendonitis       Relevant Medications   diclofenac (VOLTAREN) 75 MG EC tablet     -Patient seen and evaluated -Debrided ulceration at left 1st MTPJ using sterile tissue nipper in an excisional fashion to healthy bleeding  -Applied Iodosorb and bandaid to left 1st MTPJ and advised patient to do the same using antibiotic cream daily  -Patient is refusing steroid injection at left medial ankle -Rx Diclofenac   -Continue with if neededTramadol for left foot pain -Continue with Gabapentin 347m qhs, may increase to total of 9038mqhs if needed  -Advised patient to continue with post-op shoe on right and left foot -Patient met with RiLil[facility] Channel[facility] for diabetic shoes  -Advised patient to limit activity to necessity  -Advised patient to elevate as necessary  -Return 3-4 weeks for follow up eval of left foot ulcer. In the meantime, patient to call office if any issues or problems arise.  TiLandis MartinsDPM

## 2017-04-19 NOTE — Progress Notes (Signed)
Patient presented today to pick up custom diabetic shoes and one pair of custom inserts.   Patient was assessed as to fit and function.   Shoes and inserts conformed to her feet well with no issues reported.  Patient advised to follow up if there were any concerns.  Codes to be dropped are a5501 x 2 and a5513 x 2

## 2017-04-20 NOTE — Progress Notes (Signed)
Patient discussed with orthotist. Agree with below. Patient to follow up as scheduled for continued care or sooner if problems or issues arise. -Dr. Cannon Kettle

## 2017-04-28 ENCOUNTER — Other Ambulatory Visit: Payer: Medicare Other

## 2017-05-04 ENCOUNTER — Ambulatory Visit (HOSPITAL_COMMUNITY)
Admission: RE | Admit: 2017-05-04 | Discharge: 2017-05-04 | Disposition: A | Discharge status: Home / Self Care | Payer: Medicare Other | Source: Ambulatory Visit | Attending: Hematology | Admitting: Hematology

## 2017-05-04 ENCOUNTER — Other Ambulatory Visit (HOSPITAL_BASED_OUTPATIENT_CLINIC_OR_DEPARTMENT_OTHER): Payer: Medicare Other

## 2017-05-04 DIAGNOSIS — I700 Atherosclerosis of aorta: Secondary | ICD-10-CM | POA: Insufficient documentation

## 2017-05-04 DIAGNOSIS — C187 Malignant neoplasm of sigmoid colon: Secondary | ICD-10-CM | POA: Diagnosis present

## 2017-05-04 DIAGNOSIS — J439 Emphysema, unspecified: Secondary | ICD-10-CM | POA: Diagnosis not present

## 2017-05-04 LAB — COMPREHENSIVE METABOLIC PANEL
ALBUMIN: 3.4 g/dL — AB (ref 3.5–5.0)
ALK PHOS: 123 U/L (ref 40–150)
ALT: 13 U/L (ref 0–55)
AST: 8 U/L (ref 5–34)
Anion Gap: 14 mEq/L — ABNORMAL HIGH (ref 3–11)
BUN: 5.8 mg/dL — AB (ref 7.0–26.0)
CHLORIDE: 92 meq/L — AB (ref 98–109)
CO2: 21 mEq/L — ABNORMAL LOW (ref 22–29)
Calcium: 9.1 mg/dL (ref 8.4–10.4)
Creatinine: 1.1 mg/dL (ref 0.6–1.1)
EGFR: 60 mL/min/{1.73_m2} — ABNORMAL LOW (ref >90)
Glucose: 614 mg/dl (ref 70–140)
POTASSIUM: 4.2 meq/L (ref 3.5–5.1)
SODIUM: 127 meq/L — AB (ref 136–145)
Total Bilirubin: 0.24 mg/dL (ref 0.20–1.20)
Total Protein: 6.7 g/dL (ref 6.4–8.3)

## 2017-05-04 LAB — CBC WITH DIFFERENTIAL/PLATELET
BASO%: 0.7 % (ref 0.0–2.0)
BASOS ABS: 0.1 10*3/uL (ref 0.0–0.1)
EOS%: 0.9 % (ref 0.0–7.0)
Eosinophils Absolute: 0.1 10*3/uL (ref 0.0–0.5)
HCT: 40.8 % (ref 34.8–46.6)
HGB: 13.8 g/dL (ref 11.6–15.9)
LYMPH%: 29.4 % (ref 14.0–49.7)
MCH: 30.6 pg (ref 25.1–34.0)
MCHC: 33.8 g/dL (ref 31.5–36.0)
MCV: 90.5 fL (ref 79.5–101.0)
MONO#: 0.5 10*3/uL (ref 0.1–0.9)
MONO%: 3.8 % (ref 0.0–14.0)
NEUT#: 8.3 10*3/uL — ABNORMAL HIGH (ref 1.5–6.5)
NEUT%: 65.2 % (ref 38.4–76.8)
Platelets: 233 10*3/uL (ref 145–400)
RBC: 4.51 10*6/uL (ref 3.70–5.45)
RDW: 12.8 % (ref 11.2–14.5)
WBC: 12.6 10*3/uL — ABNORMAL HIGH (ref 3.9–10.3)
lymph#: 3.7 10*3/uL — ABNORMAL HIGH (ref 0.9–3.3)

## 2017-05-04 LAB — CEA (IN HOUSE-CHCC): CEA (CHCC-In House): 8.56 ng/mL — ABNORMAL HIGH (ref 0.00–5.00)

## 2017-05-04 MED ORDER — IOPAMIDOL (ISOVUE-300) INJECTION 61%
100.0000 mL | Freq: Once | INTRAVENOUS | Status: AC | PRN
  Administered 2017-05-04: 100 mL via INTRAVENOUS

## 2017-05-04 MED ORDER — IOPAMIDOL (ISOVUE-300) INJECTION 61%
INTRAVENOUS | Status: AC
  Filled 2017-05-04: qty 100

## 2017-05-04 NOTE — Progress Notes (Signed)
[place]  [phone] Fax:(336) [phone]  Clinic follow up Note   Patient Care Team: Tamsen Roers, MD as PCP - General (Family Medicine) Elayne Snare, MD as Consulting Physician (Endocrinology) Erroll Luna, MD as Consulting Physician (General Surgery) Truitt Merle, MD as Consulting Physician (Hematology) Arta Silence, MD as Consulting Physician (Gastroenterology) 05/05/2017  CHIEF COMPLAINTS:  Follow up stage I colon cancer   Oncology History   Colon cancer   Staging form: Colon and Rectum, AJCC 7th Edition     Pathologic stage from 05/16/2015: Stage I (T1, N0, cM0) - Signed by Truitt Merle, MD on 06/08/2015       Cancer of sigmoid colon ([place])   02/26/2015 Initial Diagnosis    Sigmoid Colon cancer      02/26/2015 Procedure    Colonoscopy showed internal hemorrhoids, 2 polyps in the transverse and ileocecal valve. There is a ulcerated polypoid lesion in the sigmoid colon which was biopsied.      03/02/2015 Tumor Marker    CEA= 2.5      03/06/2015 Imaging    CT chest, abdomen and pelvis with contrast showed no evidence of metastatic disease.      04/22/2015 Surgery    Sigmoid colon segmental resection, showed grade 2 adenocarcinoma, T1, 1.3 cm, 10 lymph nodes were negative, no lymphovascular invasion, no perineural invasion.        HISTORY OF PRESENTING ILLNESS:  [name] 51 y.o. female with the multiple medical comorbidities, including diabetes, hypertension, depression and stroke in 2015, is here because of recently diagnosed colon cancer.  She has chronic constipation, no nausea, abdominal pain or diarrhea. She denies melena or hematochezia. She was referred to see GI Dr. Paulita Fujita, and underwent a colonoscopy on 02/26/2015. The colonoscopy showed internal hemorrhoids, 2 polyps in the transverse and ileocecal valve. A ulcerated polypoid lesion in the sigmoid colon was seen and biopsied, which showed adenocarcinoma. She was referred to   surgeon Dr. Brantley Stage, and underwent laparoscopic sigmoid colon segmental resection on 04/22/2015.  She has recovered well from surgery. She was discharged home after surgery. She had ED visit on 05/04/2015 for wound dehiscence after staple removal. She has been changing the dressing every other day at home. She has appointment to see Dr. Brantley Stage this abdomen.  She denies any significant pain, BM is normal. No other new complains. She has diabetic neuropathy in hands and feet, which is stable. She lives alone, does not have much social support. She appears to be depressed with a very flat facial expression, but she denies suicidal ideas.  CURRENT THERAPY: Surveillance  INTERIM HISTORY  [name] returns for follow-up here to discuss scan results. She sometimes gets constipated which resolves on it's own. Denies any nausea abdominal pain She reports vaginal discharge, which she attributes to a yeast infection.      MEDICAL HISTORY:  Past Medical History:  Diagnosis Date  . Anginal pain ([place])   . Anxiety   . Arthritis    "qwhere" (02/12/2014)  . Bipolar disorder ([place])   . Cancer [facility])    colon cancer  . Chest pain    a. 2008 Cath: normal cors;  b. 12/2013 [place] CL: EF 47%, no ischemia/infarct.  . Cholecystitis   . Chronic back pain   . Chronic pain   . Claudication ([place])    a. 12/2013 ABI's: R 0.97, L 0.94.  [name] COPD (chronic obstructive pulmonary disease) ([place])   . Depression   . Diabetic foot ulcer ([place])  chronic  . DJD (degenerative joint disease)   . Edema   . Gastroparesis   . GERD (gastroesophageal reflux disease)   . H/O hiatal hernia   . OTLXBWIO(035.5)    "weekly" (02/12/2014)  . History of blood transfusion 1986   "related to lost a child" (02/12/2014)  . Hypercholesterolemia   . Hyperlipemia   . Hypersomnia   . Hypertension   . Insomnia   . MRSA (methicillin resistant Staphylococcus aureus)   . Neurogenic bladder   . OSA (obstructive sleep apnea) 07/04/2014  .  Palpitations   . Personality disorder   . Pneumonia    "twice, I think" (02/12/2014)  . Sleep apnea    didn't tolerate cpap (02/12/2014)  . SOB (shortness of breath) on exertion   . Stroke ([place]) 02/12/2014   "eyesight is messed up; not steady on my feet" (02/12/2014)  . SUI (stress urinary incontinence, female) S/P SLING 12-29-2011  . Tobacco abuse   . Type II diabetes mellitus ([place])   . Ulcer   . Vertigo     SURGICAL HISTORY: Past Surgical History:  Procedure Laterality Date  . ABDOMINAL HYSTERECTOMY    . ANTERIOR CERVICAL DECOMP/DISCECTOMY FUSION  2000   C6 - 7  . CARDIAC CATHETERIZATION  05-22-2008   DR SKAINS   NO SIGNIFECANT CAD/ NORMAL LV/  EF 65%/  NO WALL MOTION ABNORMALITIES  . CARDIAC CATHETERIZATION  02/12/2014  . CARPAL TUNNEL RELEASE Right 04-25-2013  . [place]; 1992  . CYSTO N/A 04/30/2013   Procedure: CYSTOSCOPY;  Surgeon: Reece Packer, MD;  Location: [facility];  Service: Urology;  Laterality: N/A;  . CYSTOSCOPY WITH INJECTION  05/04/2012   Procedure: CYSTOSCOPY WITH INJECTION;  Surgeon: Reece Packer, MD;  Location: [place];  Service: Urology;  Laterality: N/A;  MACROPLASTIQUE INJECTION  . CYSTOSCOPY WITH INJECTION  08/28/2012   Procedure: CYSTOSCOPY WITH INJECTION;  Surgeon: Reece Packer, MD;  Location[facility];  Service: Urology;  Laterality: N/A;  cysto and macroplastique   . FOOT SURGERY Bilateral   . HERNIA REPAIR  ?1996   "stomach"  . KNEE ARTHROSCOPY W/ ALLOGRAFT IMPANT Left    graft x 2  . KNEE SURGERY     TOTAL 8 SURG'S  . LAPAROSCOPIC SIGMOID COLECTOMY  04/22/2015  . LAPAROSCOPIC SIGMOID COLECTOMY N/A 04/22/2015   Procedure: LAPAROSCOPIC HAND ASSISTED SIGMOID COLECTOMY;  Surgeon: Erroll Luna, MD;  Location: [place];  Service: General;  Laterality: N/A;  . LEFT HEART CATHETERIZATION WITH CORONARY ANGIOGRAM N/A 02/12/2014   Procedure: LEFT HEART CATHETERIZATION WITH CORONARY ANGIOGRAM;  Surgeon:  Candee Furbish, MD;  Location[facility];  Service: Cardiovascular;  Laterality: N/A;  . LUMBAR FUSION     "cage in my spine"  . MULTIPLE LAPAROSCOPIES FOR ENDOMETRIOSIS    . PUBOVAGINAL SLING  12/29/2011   Procedure: Gaynelle Arabian;  Surgeon: Reece Packer, MD;  Location: [facility];  Service: Urology;  Laterality: N/A;  cysto and sparc sling   . PUBOVAGINAL SLING N/A 04/30/2013   Procedure: REMOVAL OF VAGINAL MESH;  Surgeon: Reece Packer, MD;  [facility];  Service: Urology;  Laterality: N/A;  . RECONSTURCTION OF CONGENITAL UTERUS ANOMALY  1983  . REPEAT RECONSTRUCTION ACL LEFT KNEE/ SCREWS REMOVED  03-28-2000   CADAVER GRAFT  . TOTAL ABDOMINAL HYSTERECTOMY W/ BILATERAL SALPINGOOPHORECTOMY  1997  . WOUND DEBRIDEMENT Right 09/05/2016   Procedure: DEBRIDEMENT WOUND WITH GRAFT RIGHT FOOT;  Surgeon: Lady Saucier  Cannon Kettle, DPM;  Location: [place];  Service: Podiatry;  Laterality: Right;    SOCIAL HISTORY: Social History   Social History  . Marital status: Divorced    Spouse name: N/A  . Number of children: 2  . Years of education: 6th   Occupational History  . Not on file.   Social History Main Topics  . Smoking status: Current Every Day Smoker    Packs/day: 2.00    Years: 41.00    Types: Cigarettes, E-cigarettes  . Smokeless tobacco: Never Used  . Alcohol use No     Comment: denies  . Drug use: No  . Sexual activity: Yes   Other Topics Concern  . Not on file   Social History Narrative  Patient is divorced and lives alone- independent in ADLs, Drives   Patient has two adult children- grown son and daughter who is special needs in a home   Patient is disabled since 10   Patient has a 6th grade education.   Patient is right-handed.   Depression-medication and therapist   Patient drinks 2- 3 liters of soda and drinks tea but not everyday.    Does not routinely exercise.      Patient reports that she drinks "a lot" of caffeine daily   [place]  FAMILY HISTORY: Family History  Problem Relation Age of Onset  . Hypertension Mother   . Diabetes Mother   . Cancer - Other Mother        lymphoma   . Cancer - Other Father        lung, bladder cancer   . Heart attack Father   . Cancer - Other Brother        bladder cancer     ALLERGIES:  is allergic to latex; sweetening enhancer [flavoring agent]; tetracyclines & related; aspartame and phenylalanine; ibuprofen; trazodone and nefazodone; and triazolam.  MEDICATIONS:  Current Outpatient Prescriptions  Medication Sig Dispense Refill  . diclofenac (VOLTAREN) 75 MG EC tablet Take 1 tablet (75 mg total) by mouth 2 (two) times daily. 30 tablet 1  . fluconazole (DIFLUCAN) 100 MG tablet Take 1 tablet (100 mg total) by mouth daily. 2 tablet 0  . Insulin Glargine (BASAGLAR KWIKPEN) 100 UNIT/ML SOPN Inject 0.1 mLs (10 Units total) into the skin every morning. And pen needles 1/day 5 pen 11  . oxyCODONE (OXY IR/ROXICODONE) 5 MG immediate release tablet     . oxyCODONE-acetaminophen (PERCOCET) 10-325 MG tablet     . traMADol (ULTRAM) 50 MG tablet Take 1 tablet (50 mg total) by mouth every 8 (eight) hours as needed. 30 tablet 0   Current Facility-Administered Medications  Medication Dose Route Frequency Provider Last Rate Last  Dose  . betamethasone acetate-betamethasone sodium phosphate (CELESTONE) injection 3 mg  3 mg Intramuscular Once Edrick Kins, DPM        REVIEW OF SYSTEMS:   Constitutional: Denies fevers, chills or abnormal night sweats Eyes: Denies blurriness of vision, double vision or watery eyes Ears, nose, mouth, throat, and face: Denies mucositis or sore throat  Respiratory: Denies cough, dyspnea or wheezes Cardiovascular: Denies palpitation, chest discomfort or lower extremity swelling Gastrointestinal:  Denies nausea, heartburn or change in bowel habits Skin: Denies abnormal skin rashes  Lymphatics: Denies new lymphadenopathy or easy bruising Neurological: Denies numbness, tingling or new weaknesses Behavioral/Psych: Mood is stable, no new changes  All other systems were reviewed with the patient and are negative.  PHYSICAL EXAMINATION:  ECOG PERFORMANCE STATUS: 1 - Symptomatic but completely ambulatory  Vitals:   05/05/17 1522  BP: (!) 145/73  Pulse: 91  Resp: 19  Temp: 98.6 F (37 C)   Filed Weights   05/05/17 1522  Weight: 171 lb 9.6 oz (77.8 kg)    GENERAL:alert, no distress and comfortable. Flat affect.  SKIN: skin color, texture, turgor are normal, no rashes or significant lesions except her wound at bilateral feet, they are wrapped and pt prefers me not to open it  EYES: normal, conjunctiva are pink and non-injected, sclera clear OROPHARYNX:no exudate, no erythema and lips, buccal mucosa, and tongue normal  NECK: supple, thyroid normal size, non-tender, without nodularity LYMPH:  no palpable lymphadenopathy in the cervical, axillary or inguinal LUNGS: clear to auscultation and percussion with  normal breathing effort HEART: regular rate & rhythm and no murmurs and no lower extremity edema ABDOMEN:abdomen soft,abdominal wall. Non-tender and normal bowel sounds Musculoskeletal:no cyanosis of digits and no clubbing  PSYCH: alert & oriented x 3 with fluent speech NEURO: no  focal motor/sensory deficits  LABORATORY DATA:  I have reviewed the data as listed CBC Latest Ref Rng & Units 05/04/2017 12/02/2016 09/05/2016  WBC 3.9 - 10.3 10e3/uL 12.6(H) 20.0(H) 11.9(H)  Hemoglobin 11.6 - 15.9 g/dL 13.8 13.1 13.0  Hematocrit 34.8 - 46.6 % 40.8 37.6 39.8  Platelets 145 - 400 10e3/uL 233 223 239    CMP Latest Ref Rng & Units 05/04/2017 12/28/2016 12/02/2016  Glucose 70 - 140 mg/dl 614(HH) 284(H) 213(H)  BUN 7.0 - 26.0 mg/dL 5.8(L) 10 5.3(L)  Creatinine 0.6 - 1.1 mg/dL 1.1 0.76 0.8  Sodium 136 - 145 mEq/L 127(L) 133(L) 134(L)  Potassium 3.5 - 5.1 mEq/L 4.2 4.1 3.7  Chloride 96 - 112 mEq/L - 97 -  CO2 22 - 29 mEq/L 21(L) 24 23  Calcium 8.4 - 10.4 mg/dL 9.1 9.5 9.2  Total Protein 6.4 - 8.3 g/dL 6.7 - 6.7  Total Bilirubin 0.20 - 1.20 mg/dL 0.24 - 0.54  Alkaline Phos 40 - 150 U/L 123 - 96  AST 5 - 34 U/L 8 - 8  ALT 0 - 55 U/L 13 - 12   Results for MICHOLE, LECUYER (MRN [id number]) as of 05/05/2017 15:38  Ref. Range 12/02/2016 14:32 12/02/2016 14:32 05/04/2017 14:18  CEA Latest Ref Range: 0.0 - 4.7 ng/mL  2.6 7.5 (H)  CEA ([facility]) Latest Ref Range: 0.00 - 5.00 ng/mL 2.80  8.56 (H)    PATHOLOGY REPORT Diagnosis 04/22/2015 1. Colon, segmental resection for tumor, Sigmoid - COLONIC ADENOCARCINOMA EXTENDING INTO SUBMUCOSA, 1.3 CM - MARGINS NOT INVOLVED. - NINE BENIGN LYMPH NODES (0/9). 2. Colon, segmental resection, Sigmoid - FOCAL MESENTERIC FAT NECROSIS WITH HEMORRHAGE AND FIBROSIS. - NO EVIDENCE OF MALIGNANCY. - ONE BENIGN LYMPH NODE (0/1). 3. Colon, resection margin (donut), Proximal donut - BENIGN COLON TISSUE. - NO EVIDENCE OF MALIGNANCY. 4. Colon, resection margin (donut), Distal donut - BENIGN COLON TISSUE. - NO EVIDENCE OF MALIGNANCY.  Microscopic Comment 1. COLON AND RECTUM (INCLUDING TRANS-ANAL RESECTION): Specimen: Sigmoid colon with proximal and distal donut margins. Procedure: Segmental resection with proximal and distal donut margins. Tumor site:  Sigmoid Specimen integrity: Previously opened Macroscopic intactness of mesorectum: Incomplete Macroscopic tumor perforation: No Invasive tumor: Maximum size: 1.3 cm Histologic type(s): Colonic adenocarcinoma Histologic grade and differentiation: G2: moderately differentiated/low grade Type of polyp in which invasive carcinoma arose: No residual polyp Microscopic extension of invasive tumor: Into submucosa Lymph-Vascular invasion: N Peri-neural invasion: No Tumor deposit(s) (discontinuous extramural extension): No Resection margins: Proximal margin: Free of tumor Distal margin: Free of tumor Circumferential (radial) (posterior ascending, posterior descending; lateral and posterior mid-rectum; and entire lower 1/3 rectum): N/A Mesenteric margin (sigmoid and transverse): Free of tumor Distance closest margin (if all above margins negative): N/A Treatment effect (neo-adjuvant therapy): No Additional polyp(s): No Non-neoplastic findings: Focal mesenteric adipose tissue fat necrosis with hemorrhage and fibrosis. Lymph nodes: number examined 10; number positive: 0 Pathologic Staging: pT1, pN0, pMX Ancillary studies: Pending. (JDP:kh 4 -28-16)  RADIOGRAPHIC STUDIES: I have personally reviewed the radiological images as listed and agreed with the findings in the report.  CT chest, abdomen and pelvis w contrast on 05/06/2016 IMPRESSION: 1. Stable exam. No evidence for metastatic disease in the chest, abdomen, or pelvis. 2. Suture line in  the mid sigmoid colon, compatible with partial colectomy. 3. Centrilobular emphysema. 4. Steatosis.  ASSESSMENT & PLAN: 51 y.o.  Caucasian female, with multiple medical comorbidities, who was found to have a stage I sigmoid colon cancer.  1. Stage I sigmoid colon adenocarcinoma, pT1N0M0, moderately differentiated, MMR normal -I previously reviewed her surgical pathology findings with patient in great details. She appears to have very early stage colon  cancer, no lymphovascular invasion, no perineural invasion or other high risk features. Her surgical margins were negative.  -We previously discussed that her colon cancer is likely cured by complete surgical resection, however she does carry a small risk of cancer recurrence after surgery. -There is no role of adjuvant chemotherapy for stage I colon cancer -Her tumor is MMR normal, she does not have significant family history of colon or endometrial cancer, unlikely Lynch syndrome. -We discussed the surveillance plan. I recommend lab tests including CEA and physical exam every 3-4 months for the first two year, then every 6-12 month for additional 3 years, colonoscopy and CT scan at one year.  - her surveillance scan from May 2017 showed no evidence of recurrence. Per patient, she has had colonoscopy this year by Dr. Paulita Fujita, which was negative. -Lab reviewed, CBC and CMP normal, except hyperglycemia and elevated WBC. CEA slightly elevated lately probably nonspecific giving the negative surveillance CT scan that same time. I'll watch her CEA closely. - no evidence of recurrence so far. - We'll continue surveillance. - repeat scan in 1 yr, after f/u clinically  2. Depression and coping -She appears to be depressed, has very flat facial expression, she states this is her normal status and she denies suicidal ideas.  3. Poorly controlled diabetes - She has stopped insulin pump 3-4 months ago,BG 614 mg/dl yesterday  -Patient is very noncompliant about her diet and treatment.  -I had a long conversation with patient about the complications from severely uncontrolled diabetes. She finally agreed to take long-acting insulin once a day, but she refused to take more than that. She will not follow diabetic diet - I have called her endocrinologist Dr. Ronnie Derby office and inform the above, they will probably call in lantus -I strongly encourage pt to follow up with Dr. Dwyane Dee. She refuses to check her BG at  home.   4.  and hypertension, history of stroke, COPD -f/u with PCP   5. Smoking cessation -We previously had discussion about smoking cessation. She is still smoking 1.5 pack a day. -She is willing to cut back and quit, I previously strongly encouraged her to do so. She is not interested in smoking cessation program.   Plan -  f/u with Dr. Dwyane Dee for her DM   -lab and f/u in 4 months    All questions were answered. The patient knows to call the clinic with any problems, questions or concerns.  I spent 20 minutes counseling the patient face to face. The total time spent in the appointment was 30 minutes and more than 50% was on counseling.  This document serves as a record of services personally performed by Truitt Merle, MD. It was created on her behalf by Brandt Loosen, a trained medical scribe. The creation of this record is based on the scribe's personal observations and the provider's statements to them. This document has been checked and approved by the attending provider.     Truitt Merle, MD 05/05/2017

## 2017-05-05 ENCOUNTER — Telehealth: Payer: Self-pay

## 2017-05-05 ENCOUNTER — Encounter: Payer: Self-pay | Admitting: Hematology

## 2017-05-05 ENCOUNTER — Ambulatory Visit (HOSPITAL_BASED_OUTPATIENT_CLINIC_OR_DEPARTMENT_OTHER): Payer: Medicare Other | Admitting: Hematology

## 2017-05-05 ENCOUNTER — Telehealth: Payer: Self-pay | Admitting: *Deleted

## 2017-05-05 VITALS — BP 145/73 | HR 91 | Temp 98.6°F | Resp 19 | Ht 65.0 in | Wt 171.6 lb

## 2017-05-05 DIAGNOSIS — D329 Benign neoplasm of meninges, unspecified: Secondary | ICD-10-CM | POA: Diagnosis not present

## 2017-05-05 DIAGNOSIS — C187 Malignant neoplasm of sigmoid colon: Secondary | ICD-10-CM | POA: Diagnosis present

## 2017-05-05 DIAGNOSIS — Z8673 Personal history of transient ischemic attack (TIA), and cerebral infarction without residual deficits: Secondary | ICD-10-CM | POA: Diagnosis not present

## 2017-05-05 DIAGNOSIS — J449 Chronic obstructive pulmonary disease, unspecified: Secondary | ICD-10-CM | POA: Diagnosis not present

## 2017-05-05 DIAGNOSIS — E1165 Type 2 diabetes mellitus with hyperglycemia: Secondary | ICD-10-CM

## 2017-05-05 DIAGNOSIS — I10 Essential (primary) hypertension: Secondary | ICD-10-CM | POA: Diagnosis not present

## 2017-05-05 DIAGNOSIS — E1169 Type 2 diabetes mellitus with other specified complication: Secondary | ICD-10-CM

## 2017-05-05 DIAGNOSIS — Z720 Tobacco use: Secondary | ICD-10-CM

## 2017-05-05 DIAGNOSIS — IMO0002 Reserved for concepts with insufficient information to code with codable children: Secondary | ICD-10-CM

## 2017-05-05 LAB — CEA: CEA1: 7.5 ng/mL — AB (ref 0.0–4.7)

## 2017-05-05 MED ORDER — FLUCONAZOLE 100 MG PO TABS: 100.0000 mg | ORAL_TABLET | Freq: Every day | ORAL | 0 refills | Status: DC

## 2017-05-05 MED ORDER — BASAGLAR KWIKPEN 100 UNIT/ML ~~LOC~~ SOPN: 10.0000 [IU] | PEN_INJECTOR | SUBCUTANEOUS | 11 refills | Status: DC

## 2017-05-05 NOTE — Patient Instructions (Signed)
Pasos para dejar de fumar (Steps to Quit Smoking) Fumar tabaco es malo para su salud. Puede afectar a casi cualquier rgano del cuerpo. Fumar lo pone a usted y a Public affairs consultant a su alrededor en riesgo de Sports administrator enfermedades graves de [place] plazo (crnicas). Dejar de fumar es difcil, pero es una de las mejores cosas que puede hacer por su salud. Nunca es muy tarde para dejar de fumar. Olinda AL [place]? Al dejar de fumar, se reduce el riesgo de contraer enfermedades y afecciones graves. Estas pueden incluir los siguientes:  Enfermedad o cncer de pulmn.  Cardiopata coronaria.  Ictus.  Infarto de miocardio.  Imposibilidad de tener hijos (infertilidad).  Huesos dbiles (osteoporosis) y huesos rotos (fracturas). La tos, las sibilancias y la falta de aire son sntomas que mejoran cuando deja de fumar. Es posible tambin que se enferme con Media planner. Si est embarazada, dejar de fumar la ayudar a reducir las probabilidades de Best boy un beb de bajo peso al nacer. QU PUEDO HACER PARA [place]? Pregntele al [employer] cosas que pueden ayudarlo a dejar el hbito. Algunos cosas que puede hacer (estrategias) incluyen:  Dejar de fumar de forma definitiva en lugar de ir reduciendo gradualmente la cantidad de cigarrillos durante un perodo.  Recibir asesoramiento psicolgico individual. Es ms probable que tenga xito si asiste a diversas sesiones de Merchant navy officer.  Usar recursos y sistemas de soporte, como, por ejemplo:  Charlas en lnea con un consejero.  Lneas telefnicas para dejar de fumar.  Materiales impresos de [place].  Grupos de apoyo o asesoramiento psicolgico grupal.  Programas de mensajes de texto.  Aplicaciones para telfonos celulares.  Tomar medicamentos. Algunos de estos medicamentos pueden contener nicotina. Si est embarazada o amamantando, no tome ningn medicamento  para dejar de fumar, excepto que el mdico lo autorice. Hable con el mdico sobre el asesoramiento psicolgico o sobre otras cosas que [place]. Hable con el mdico sobre usar ms de una estrategia al [employer], [place], por ejemplo, tomar medicamentos y tambin recibir asesoramiento psicolgico. Esto puede facilitarle el proceso para dejar de fumar. QU PUEDO HACER PARA QUE DEJAR DE FUMAR SEA MS FCIL? Al principio, dejar de fumar puede parecer abrumador, pero hay muchas opciones que facilitan el [place]. Tome estas medidas:  Converse con su familia o sus amigos. Busque su apoyo y [place].  Llame a las lneas telefnicas que ayudan a dejar de fumar, pngase en contacto con grupos de apoyo o reciba asesoramiento de un consejero.  Pdale a la gente que fuma que no lo haga a su alrededor.  Evite los lugares que pueden despertar el deseo de fumar (disparadores), como, por ejemplo:  Bares.  Fiestas.  reas para fumar en el trabajo.  Pase tiempo con personas que no fuman.  Disminuya todo tipo de estrs de la vida diaria. El estrs puede hacer que usted desee fumar. Pruebe estas cosas para disminuir el estrs:  Psychologist, prison and probation services actividad fsica con regularidad.  Practicar ejercicios de respiracin profunda.  Practicar yoga.  Medite.  Realizar una visualizacin corporal. Para ello, cierre los ojos, concntrese en una zona del cuerpo a la vez desde la cabeza [facility] dedos de los pies y fjese qu partes del cuerpo estn tensas. Relaje los msculos de esas reas.  Descargue o compre aplicaciones para telfonos mviles o tabletas que le ayuden a respetar el plan para dejar de fumar. Hay muchas aplicaciones gratuitas, como QuitGuide de  los Centros para el Control y la Prevencin de Arboriculturist (CDC, Doctor, hospital for [employer] and Prevention). Puede hallar otros recursos de [employer].gov y en otros sitios web. Esta informacin no tiene Marine scientist el consejo del mdico.  Asegrese de hacerle al mdico cualquier pregunta que tenga. Document Released: 01/14/2011 Document Revised: 03/05/2012 Document Reviewed: 04/28/2015 Elsevier Interactive Patient Education  2017 [employer].

## 2017-05-05 NOTE — Telephone Encounter (Signed)
Please verify no n/v/sob. If you have any symptoms, please tell us now I have sent a prescription to your pharmacy, to start 10 units each morning. Please see Dr Dwyane Dee, next available. Please call Dr Dwyane Dee next week, to tell us how the blood sugar is doing

## 2017-05-05 NOTE — Telephone Encounter (Signed)
Please forward me the original telephone encounter information along with your message instead of a new telephone encounter

## 2017-05-05 NOTE — Telephone Encounter (Signed)
Called & spoke with Caprice Beaver LPN at Dr Ronnie Derby office & informed per Dr [name] that message was sent to him yest in [facility] regarding this pt's elevated glucose & she did not get a reply.  She states Dr Dwyane Dee & his nurses are not in the office.  Pt not on any insulin & non compliant but willing to take a one time injection of insulin.  Almyra Free will discuss with on-call physician.  She mentioned Dr Loanne Drilling.  Suggested they call pt. Dr [name] informed.   I saw message from Dr Loanne Drilling & that office had tried to reach pt but unable to do so.  Called pt on mobile phone & reached her & informed of Dr Cordelia Pen message & insulin called in for her.  She states she agreed to take one injection & she will not check her blood sugar's at home. She didn't say whether she would see Dr Dwyane Dee.  Message routed to Dr [name] & Dr Loanne Drilling & Dr Dwyane Dee.

## 2017-05-05 NOTE — Telephone Encounter (Signed)
Had CT done today along with Lab, when they checked 614 glucose.  Not on insulin, was on pump; but now not on it. Refused any treatment at previous OV in March. Dr.Fong sent Dr.Kumar a message and wanted to make sure he had received a message.  Told Dr.Fong she is willing to do something. Patient is now wanting to do something over the weekend.  Can you please advise on if we can help patient. Thank you.

## 2017-05-05 NOTE — Telephone Encounter (Signed)
Called and LVM advising of Dr.Ellison's message to make sure of no n/v/sob and that he had submitted a rx to the pharmacy to start 10 units each morning, also to call us next week with sugars and to make appointment with Dr.Kumar at next available. I left call back number to return call.   Will notify Dr.Kumar of these messages.

## 2017-05-08 NOTE — Telephone Encounter (Signed)
It would not let me after completing it. Here is the conversation.       Renato Shin, MD routed this conversation to Nile Riggs, CMA   Renato Shin, MD      05/05/17 4:21 PM  Note    Please verify no n/v/sob. If you have any symptoms, please tell us now I have sent a prescription to your pharmacy, to start 10 units each morning. Please see Dr Dwyane Dee, next available. Please call Dr Dwyane Dee next week, to tell us how the blood sugar is doing          05/05/17 4:08 PM  You routed this conversation to Renato Shin, MD   Me      05/05/17 3:59 PM  Note    Had CT done today along with Lab, when they checked 614 glucose.  Not on insulin, was on pump; but now not on it. Refused any treatment at previous OV in March. Dr.Fong sent Dr.Kumar a message and wanted to make sure he had received a message.  Told Dr.Fong she is willing to do something. Patient is now wanting to do something over the weekend.  Can you please advise on if we can help patient. Thank you.

## 2017-05-09 ENCOUNTER — Telehealth: Payer: Self-pay | Admitting: Hematology

## 2017-05-09 NOTE — Telephone Encounter (Signed)
Patient bypassed scheduling on 05/05/17.  Appointment scheduled per 05/05/17 los. Patient was mailed a copy of the appointment letter and schedule, per 05/05/17 los.

## 2017-05-10 NOTE — Telephone Encounter (Signed)
This message was handled on 05/05/2017 by Caprice Beaver. She telephone note on 05/05/2017.

## 2017-05-11 ENCOUNTER — Ambulatory Visit
Admission: RE | Admit: 2017-05-11 | Discharge: 2017-05-11 | Disposition: A | Discharge status: Home / Self Care | Payer: Medicare Other | Source: Ambulatory Visit | Attending: Family Medicine | Admitting: Family Medicine

## 2017-05-11 ENCOUNTER — Other Ambulatory Visit: Payer: Self-pay | Admitting: Family Medicine

## 2017-05-11 DIAGNOSIS — R05 Cough: Secondary | ICD-10-CM

## 2017-05-11 DIAGNOSIS — R059 Cough, unspecified: Secondary | ICD-10-CM

## 2017-05-16 ENCOUNTER — Ambulatory Visit (INDEPENDENT_AMBULATORY_CARE_PROVIDER_SITE_OTHER): Payer: Medicare Other | Admitting: Sports Medicine

## 2017-05-16 ENCOUNTER — Encounter: Payer: Self-pay | Admitting: Sports Medicine

## 2017-05-16 DIAGNOSIS — M79674 Pain in right toe(s): Secondary | ICD-10-CM | POA: Diagnosis not present

## 2017-05-16 DIAGNOSIS — B351 Tinea unguium: Secondary | ICD-10-CM | POA: Diagnosis not present

## 2017-05-16 DIAGNOSIS — M79675 Pain in left toe(s): Secondary | ICD-10-CM

## 2017-05-16 DIAGNOSIS — E11610 Type 2 diabetes mellitus with diabetic neuropathic arthropathy: Secondary | ICD-10-CM

## 2017-05-16 DIAGNOSIS — L97521 Non-pressure chronic ulcer of other part of left foot limited to breakdown of skin: Secondary | ICD-10-CM | POA: Diagnosis not present

## 2017-05-16 DIAGNOSIS — E1142 Type 2 diabetes mellitus with diabetic polyneuropathy: Secondary | ICD-10-CM | POA: Diagnosis not present

## 2017-05-16 MED ORDER — TRAMADOL HCL 50 MG PO TABS: 50.0000 mg | ORAL_TABLET | Freq: Three times a day (TID) | ORAL | 0 refills | Status: DC | PRN

## 2017-05-16 MED ORDER — GABAPENTIN 300 MG PO CAPS: 300.0000 mg | ORAL_CAPSULE | Freq: Every day | ORAL | 3 refills | Status: DC

## 2017-05-16 NOTE — Progress Notes (Signed)
Subjective: [name] is a 51 y.o. female patient seen today in office for follow up left foot pain and ulceration on left currently using antibiotic cream dressing to area daily at ulcer site.  Patient reports that she feels like she is walking on stilts and her feet hurt. Patient reports that she also wants her nails trimmed. Denies calf pain, denies headache, chest pain, shortness of breath, nausea, vomiting, fever, or chills. No other issues noted.   Patient Active Problem List   Diagnosis Date Noted  . Cancer of sigmoid colon ([place]) 03/09/2015  . OSA (obstructive sleep apnea) 07/04/2014  . Migraine without aura, with intractable migraine, so stated, without mention of status migrainosus 03/26/2014  . Peripheral neuropathy 03/03/2014  . Abnormal stress test 02/12/2014  . CVA (cerebral infarction) 02/12/2014  . Chest pain   . Abnormal heart rhythm   . Edema   . Hypertension   . Hyperlipemia   . Hypercholesterolemia   . Other and unspecified hyperlipidemia 11/14/2013  . Type II diabetes mellitus, uncontrolled ([place]) 07/22/2013  . Essential hypertension 07/22/2013  . 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 Diabetic polyneuropathy ([place]) 07/22/2013    Current Outpatient Prescriptions on File Prior to Visit  Medication Sig Dispense Refill  . diclofenac (VOLTAREN) 75 MG EC tablet Take 1 tablet (75 mg total) by mouth 2 (two) times daily. 30 tablet 1  . fluconazole (DIFLUCAN) 100 MG tablet Take 1 tablet (100 mg total) by mouth daily. 2 tablet 0  . Insulin Glargine (BASAGLAR KWIKPEN) 100 UNIT/ML SOPN Inject 0.1 mLs (10 Units total) into the skin every morning. And pen needles 1/day 5 pen 11  . oxyCODONE (OXY IR/ROXICODONE) 5 MG immediate release tablet      Current Facility-Administered Medications on File Prior to Visit  Medication Dose Route Frequency Provider Last Rate Last Dose  . betamethasone  acetate-betamethasone sodium phosphate (CELESTONE) injection 3 mg  3 mg Intramuscular Once Edrick Kins, DPM        Allergies  Allergen Reactions  . Latex Itching, Rash and Other (See Comments)    Pt states she cannot use condoms - cause an infection.  Use of latex on skin is okay.  Tape causes rash  . Sweetening Enhancer [Flavoring Agent] Nausea And Vomiting and Other (See Comments)    HEADACHES  . Tetracyclines & Related Nausea And Vomiting and Other (See Comments)    ;YEAST INFECTIONS  . Aspartame And Phenylalanine Nausea And Vomiting    HEADACHES  . Ibuprofen Other (See Comments)    HEADACHES  . Trazodone And Nefazodone Other (See Comments)    Hallucinations   . Triazolam Other (See Comments)    HALLUCINATIONS    Objective: There were no vitals filed for this visit.  General: No acute distress, AAOx3 however patient is having a difficult time recalling history of surgery and previous foot care  Right foot:  At right medial midfoot ulceration healed with no surrounding infection or re-opening, mild swelling right foot, no streaking erythema/cellulitis, no warmth, no drainage, no gross signs of infection noted, Capillary fill time <3 seconds in all digits, gross sensation present via light touch to right foot. Protective sensation absent. 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 No pain or crepitation with range of motion right foot.  No pain with calf compression.   To left foot there is partial thickness ulceration with granular base that measures <1cm at1st MTJ with no active  drainage, unchanged with mild maceration, no other signs of infection. Subjective occasional pain to left medial ankle along PT tendon with planus changes. No reproducible pain to palpation however subjective pain by navicular . No other issues.  Nails elongated and thickened resembling mycosis.   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[place])       Relevant Medications   traMADol (ULTRAM) 50 MG tablet   Pain due to onychomycosis of toenails of both feet         -Patient seen and evaluated -Mechanically debrided nails using sterile nail nipper without incident  -Debrided ulceration at left 1st MTPJ using sterile tissue nipper in an excisional fashion to healthy bleeding  -Applied Iodosorb and coban dressing to left 1st MTPJ and advised that we will resume home nursing  -Patient is refusing steroid injection at left medial ankle -Rx Tramdol for pain and    -Continue with Gabapentin 300mg  tid, consult placed to neruology for further recommendations -Advised patient to continue with custom diabetic shoes  -Advised patient to limit activity to necessity  -Advised patient to elevate as necessary  -Return 3-4 weeks for follow up eval of left foot ulcer. In the meantime, patient to call office if any issues or problems arise.  Landis Martins, DPM

## 2017-05-17 ENCOUNTER — Telehealth: Payer: Self-pay | Admitting: *Deleted

## 2017-05-17 DIAGNOSIS — E11610 Type 2 diabetes mellitus with diabetic neuropathic arthropathy: Secondary | ICD-10-CM

## 2017-05-17 DIAGNOSIS — E1142 Type 2 diabetes mellitus with diabetic polyneuropathy: Secondary | ICD-10-CM

## 2017-05-17 NOTE — Telephone Encounter (Signed)
Anderson Malta with [facility] called saying she received a fax sheet on [name] but is unsure of what you were needing because only the first page came through. Stated if it something you need to re-fax you can go ahead and fax it back over.

## 2017-05-17 NOTE — Telephone Encounter (Addendum)
-----   Message from Landis Martins, [place] sent at 05/16/2017  4:24 PM EDT ----- Regarding: neurology consult Consult for diabetic neuropathy pain with charcot. 05/17/2017-Faxed referral, clinicals and demographics to [facility]. 05/25/2017-[facility] - [facility] states pt cancelled appt today.06/07/2017-[facility] - [facility] states pt cancelled appt today, states was seen in the [facility] office 06/06/2017, and will pick up the visit again on 06/09/2017.

## 2017-05-17 NOTE — Telephone Encounter (Signed)
-----   Message from Landis Martins, [place] sent at 05/16/2017  3:52 PM EDT ----- Regarding: [facility] Left foot ulcer apply iodosorb and dry dressing 3x per week

## 2017-05-19 ENCOUNTER — Encounter: Payer: Self-pay | Admitting: Neurology

## 2017-05-24 ENCOUNTER — Encounter: Payer: Self-pay | Admitting: Endocrinology

## 2017-05-24 ENCOUNTER — Other Ambulatory Visit: Payer: Self-pay

## 2017-05-24 ENCOUNTER — Ambulatory Visit (INDEPENDENT_AMBULATORY_CARE_PROVIDER_SITE_OTHER): Payer: Medicare Other | Admitting: Endocrinology

## 2017-05-24 VITALS — BP 116/68 | HR 86 | Ht 65.0 in | Wt 164.6 lb

## 2017-05-24 DIAGNOSIS — Z794 Long term (current) use of insulin: Secondary | ICD-10-CM

## 2017-05-24 DIAGNOSIS — F329 Major depressive disorder, single episode, unspecified: Secondary | ICD-10-CM | POA: Diagnosis not present

## 2017-05-24 DIAGNOSIS — E1165 Type 2 diabetes mellitus with hyperglycemia: Secondary | ICD-10-CM

## 2017-05-24 DIAGNOSIS — F32A Depression, unspecified: Secondary | ICD-10-CM

## 2017-05-24 LAB — POCT GLYCOSYLATED HEMOGLOBIN (HGB A1C)

## 2017-05-24 LAB — GLUCOSE, POCT (MANUAL RESULT ENTRY): POC GLUCOSE: 561 mg/dL — AB (ref 70–99)

## 2017-05-24 MED ORDER — FREESTYLE LANCETS MISC: 12 refills | Status: DC

## 2017-05-24 MED ORDER — INSULIN ASPART 100 UNIT/ML FLEXPEN: PEN_INJECTOR | SUBCUTANEOUS | 3 refills | Status: DC

## 2017-05-24 MED ORDER — GLUCOSE BLOOD VI STRP: ORAL_STRIP | 12 refills | Status: DC

## 2017-05-24 NOTE — Patient Instructions (Signed)
Check blood sugars on waking up  at least every other day  Also check blood sugars before your main meal and about 2-3 hours later  Recommended blood sugar levels on waking up is 90-130 and about 2 hours after meal is 130-160  Please bring your blood sugar monitor to each visit, thank you  We will need to call in test strip prescriptions for the freestyle meter  BASAGLAR insulin: This is once a day and take it in the morning regardless of whether you're going to eat or not  Start with 40 units in the morning daily After 1 week if the morning sugar is still over 200 increase the dose to 44 and in 2 weeks if the sugar is over 200 increase the dose to 48 units  NOVOLOG insulin: Take 10 units before your main meal If blood sugars are consistently over 250 after the meal start taking 14 units from the next day onwards  Avoid sweet drinks, sweet fruits like grapes and switch to low sugar Gatorade

## 2017-05-24 NOTE — Progress Notes (Signed)
Patient ID: [name], female   DOB: [birth year], 51 y.o.   MRN: [id number]   Reason for Appointment: Diabetes follow-up    History of Present Illness:   Diagnosis: Type 2 diabetes mellitus, date of diagnosis: 2004.  PAST history: She has been treated mostly with insulin since about a year after diagnosis. She has had difficulty with consistent compliance with diet and also compliance with self care including glucose monitoring over the years. She had been mostly treated with basal insulin. Also had been tried on mealtime insulin but she would be noncompliant with this. Was tried on Prandin for mealtime control but difficult to judge efficacy because of lack of postprandial monitoring. Was given Victoza to start in 2011 but did not follow up after this.She was tried on premixed insulin but this did not help her control, mostly because of noncompliance with the doses. She had been using the V- go pump and had better control initially and was better compliant with the daily routine and boluses. She has had frequent education visits also. She stopped using her V.-go pump in 6/15 because of discomfort at the site of application Also did not improve with a trial of Victoza   RECENT history:    INSULIN dose: None Non-insulin hypoglycemic drugs: None  Current management, problems identified:   She has gone off insulin in early March and still has not resumed this despite very high blood sugars  She does have Basaglar at home and may have taken this only once or twice  She said that she does not want to take insulin and wants to try pills, also does not want to use the V-go pump  With previous insulin regimens she would not take mealtime insulin  Also she has no motivation to check her sugar and only blood sugar readings are through the lab on in the office, glucose has been over 600 in the lab without acidosis  She complains of feeling weak and fatigued and is losing  weight  She is excessively thirsty and her boyfriend says that she can drink 2-3 L of regular soft drinks or sweet tea in a day and she does not want to change to diet drinks, previously was using Gatorade  Blood sugar 561 in the office today  Glucometer:  FreeStyle. not Checking blood sugar  Blood Glucose readings:  None  Hypoglycemia frequency: Never.    Food preferences: eating 1 or 2 meals per day, variable intake, sandwiches at times or otherwise snacks, recently eating a large foods like grapes  Physical activity: exercise: Minimal.  She has difficulties with leg pain and weakness  Certified Diabetes Educator visit: Most recent:, 2/14.     Wt Readings from Last 3 Encounters:  05/24/17 164 lb 9.6 oz (74.7 kg)  05/05/17 171 lb 9.6 oz (77.8 kg)  03/17/17 167 lb (75.8 kg)   DM labs:   Lab Results  Component Value Date   HGBA1C >15 05/24/2017   HGBA1C 10.1 12/28/2016   HGBA1C 9.7 (H) 09/05/2016   Lab Results  Component Value Date   MICROALBUR 0.4 10/19/2016   LDLCALC 41 01/01/2016   CREATININE 1.1 05/04/2017       Other active problems: See review of systems    Allergies as of 05/24/2017      Reactions   Latex Itching, Rash, Other (See Comments)   Pt states she cannot use condoms - cause an infection.  Use of latex on skin is okay.  Tape causes  rash   Sweetening Enhancer [flavoring Agent] Nausea And Vomiting, Other (See Comments)   HEADACHES   Tetracyclines & Related Nausea And Vomiting, Other (See Comments)   ;YEAST INFECTIONS   Aspartame And Phenylalanine Nausea And Vomiting   HEADACHES   Ibuprofen Other (See Comments)   HEADACHES   Trazodone And Nefazodone Other (See Comments)   Hallucinations   Triazolam Other (See Comments)   HALLUCINATIONS      Medication List       Accurate as of 05/24/17  2:54 PM. Always use your most recent med list.          BASAGLAR KWIKPEN 100 UNIT/ML Sopn Inject 0.1 mLs (10 Units total) into the skin every morning.  And pen needles 1/day   diclofenac 75 MG EC tablet Commonly known as:  VOLTAREN Take 1 tablet (75 mg total) by mouth 2 (two) times daily.   freestyle lancets Use to check blood sugars 1-3 times daily   gabapentin 300 MG capsule Commonly known as:  NEURONTIN Take 1 capsule (300 mg total) by mouth at bedtime.   glucose blood test strip Commonly known as:  FREESTYLE LITE Use to check blood sugars 1-3 times daily   insulin aspart 100 UNIT/ML FlexPen Commonly known as:  NOVOLOG Take 10 units before your main meal. If blood sugars are consistently over 250 after the meal start taking 14 units before your main meal.   oxyCODONE 5 MG immediate release tablet Commonly known as:  Oxy IR/ROXICODONE   traMADol 50 MG tablet Commonly known as:  ULTRAM Take 1 tablet (50 mg total) by mouth every 8 (eight) hours as needed.       Allergies:  Allergies  Allergen Reactions  . Latex Itching, Rash and Other (See Comments)    Pt states she cannot use condoms - cause an infection.  Use of latex on skin is okay.  Tape causes rash  . Sweetening Enhancer [Flavoring Agent] Nausea And Vomiting and Other (See Comments)    HEADACHES  . Tetracyclines & Related Nausea And Vomiting and Other (See Comments)    ;YEAST INFECTIONS  . Aspartame And Phenylalanine Nausea And Vomiting    HEADACHES  . Ibuprofen Other (See Comments)    HEADACHES  . Trazodone And Nefazodone Other (See Comments)    Hallucinations   . Triazolam Other (See Comments)    HALLUCINATIONS    Past Medical History:  Diagnosis Date  . Anginal pain ([place])   . Anxiety   . Arthritis    "qwhere" (02/12/2014)  . Bipolar disorder ([place])   . Cancer [facility])    colon cancer  . Chest pain    a. 2008 Cath: normal cors;  b. 12/2013 Lexi CL: EF 47%, no ischemia/infarct.  . Cholecystitis   . Chronic back pain   . Chronic pain   . Claudication ([place])    a. 12/2013 ABI's: R 0.97, L 0.94.  [name] COPD (chronic obstructive pulmonary disease) ([place])   .  Depression   . Diabetic foot ulcer ([facility])    chronic  . DJD (degenerative joint disease)   . Edema   . Gastroparesis   . GERD (gastroesophageal reflux disease)   . H/O hiatal hernia   . QJJHERDE(081.4)    "weekly" (02/12/2014)  . History of blood transfusion 1986   "related to lost a child" (02/12/2014)  . Hypercholesterolemia   . Hyperlipemia   . Hypersomnia   . Hypertension   . Insomnia   . MRSA (methicillin resistant Staphylococcus aureus)   .  Neurogenic bladder   . OSA (obstructive sleep apnea) 07/04/2014  . Palpitations   . Personality disorder   . Pneumonia    "twice, I think" (02/12/2014)  . Sleep apnea    didn't tolerate cpap (02/12/2014)  . SOB (shortness of breath) on exertion   . Stroke ([place]) 02/12/2014   "eyesight is messed up; not steady on my feet" (02/12/2014)  . SUI (stress urinary incontinence, female) S/P SLING 12-29-2011  . Tobacco abuse   . Type II diabetes mellitus ([place])   . Ulcer   . Vertigo     Past Surgical History:  Procedure Laterality Date  . ABDOMINAL HYSTERECTOMY    . ANTERIOR CERVICAL DECOMP/DISCECTOMY FUSION  2000   C6 - 7  . CARDIAC CATHETERIZATION  05-22-2008   DR SKAINS   NO SIGNIFECANT CAD/ NORMAL LV/  EF 65%/  NO WALL MOTION ABNORMALITIES  . CARDIAC CATHETERIZATION  02/12/2014  . CARPAL TUNNEL RELEASE Right 04-25-2013  . [place]; 1992  . CYSTO N/A 04/30/2013   Procedure: CYSTOSCOPY;  Surgeon: Reece Packer, MD;  Location: [facility];  Service: Urology;  Laterality: N/A;  . CYSTOSCOPY WITH INJECTION  05/04/2012   Procedure: CYSTOSCOPY WITH INJECTION;  Surgeon: Reece Packer, MD;  Location: [place];  Service: Urology;  Laterality: N/A;  MACROPLASTIQUE INJECTION  . CYSTOSCOPY WITH INJECTION  08/28/2012   Procedure: CYSTOSCOPY WITH INJECTION;  Surgeon: Reece Packer, MD;  Location: [facility];  Service: Urology;  Laterality: N/A;  cysto and macroplastique   . FOOT SURGERY Bilateral   .  HERNIA REPAIR  ?1996   "stomach"  . KNEE ARTHROSCOPY W/ ALLOGRAFT IMPANT Left    graft x 2  . KNEE SURGERY     TOTAL 8 SURG'S  . LAPAROSCOPIC SIGMOID COLECTOMY  04/22/2015  . LAPAROSCOPIC SIGMOID COLECTOMY N/A 04/22/2015   Procedure: LAPAROSCOPIC HAND ASSISTED SIGMOID COLECTOMY;  Surgeon: Erroll Luna, MD;  Location: [place];  Service: General;  Laterality: N/A;  . LEFT HEART CATHETERIZATION WITH CORONARY ANGIOGRAM N/A 02/12/2014   Procedure: LEFT HEART CATHETERIZATION WITH CORONARY ANGIOGRAM;  Surgeon: Candee Furbish, MD;  Location: [facility] C[facility];  Service: Cardiovascular;  Laterality: N/A;  . LUMBAR FUSION     "cage in my spine"  . MULTIPLE LAPAROSCOPIES FOR ENDOMETRIOSIS    . PUBOVAGINAL SLING  12/29/2011   Procedure: Gaynelle Arabian;  Surgeon: Reece Packer, MD;  Location: [facility];  Service: Urology;  Laterality: N/A;  cysto and sparc sling   . PUBOVAGINAL SLING N/A 04/30/2013   Procedure: REMOVAL OF VAGINAL MESH;  Surgeon: Reece Packer, MD;  Location: [facility];  Service: Urology;  Laterality: N/A;  . RECONSTURCTION OF CONGENITAL UTERUS ANOMALY  1983  . REPEAT RECONSTRUCTION ACL LEFT KNEE/ SCREWS REMOVED  03-28-2000   CADAVER GRAFT  . TOTAL ABDOMINAL HYSTERECTOMY W/ BILATERAL SALPINGOOPHORECTOMY  1997  . WOUND DEBRIDEMENT Right 09/05/2016   Procedure: DEBRIDEMENT WOUND WITH GRAFT RIGHT FOOT;  Surgeon: Landis Martins, DPM;  Location: [place];  Service: Podiatry;  Laterality: Right;    Family History  Problem Relation Age of Onset  . Hypertension Mother   . Diabetes Mother   . Cancer - Other Mother        lymphoma   . Cancer - Other Father        lung, bladder cancer   . Heart attack Father   . Cancer - Other Brother        bladder  cancer     Social History:  reports that she has been smoking Cigarettes and E-cigarettes.  She has a 82.00 pack-year smoking history. She has never used smokeless tobacco. She reports that she does not drink alcohol or use  drugs.  Review of Systems -   DEPRESSION: Not on treatment, Has difficulty sleeping and is Now agreeing to be seen by a specialist  She has history of high triglycerides previously treated with fenofibrate   Currently she is not taking Lipitor 40 mg prescribed for high LDL  Lab Results  Component Value Date   CHOL 111 12/28/2016   HDL 34.10 (L) 12/28/2016   LDLCALC 41 01/01/2016   LDLDIRECT 46.0 12/28/2016   TRIG 291.0 (H) 12/28/2016   CHOLHDL 3 12/28/2016    Hypertension: This has been Previously controlled with spironolactone and she is not taking this now  HYPOKALEMIA:  Her last potassium was Normal, not on any supplements   Lab Results  Component Value Date   CREATININE 1.1 05/04/2017   BUN 5.8 (L) 05/04/2017   NA 127 (L) 05/04/2017   K 4.2 05/04/2017   CL 97 12/28/2016   CO2 21 (L) 05/04/2017    Painful neuropathy: She has had persistent symptoms including leg pains and numbness as well as difficulty with balance  Previously has tried various drugs including gabapentin without much relief   Foot exam Last showed absent pedal pulses and distal sensation  Followed by podiatrist, last seen this month, Has also her on the left as follows: there is partial thickness ulceration with granular base that measures <1cm at1st MTJ with no active drainage, unchanged with mild maceration, no other signs of infectionshoes  She is asking about feeling occasionally swimmy headed   Examination:   BP 116/68   Pulse 86   Ht 5\' 5"  (1.651 m)   Wt 164 lb 9.6 oz (74.7 kg)   SpO2 98%   BMI 27.39 kg/m   Body mass index is 27.39 kg/m.   Blood pressure was done standing up She has a slow hesitant gait  Assesment/Plan:   Diabetes type 2, with persistently poor control, BMI 35 See history of present illness for detailed discussion of current diabetes management, blood sugar patterns and problems identified  She has had poor control and this has been progressively worse with no  treatment He now with the help of her boyfriend agreed to start managing her diabetes as directed Although she is reluctant to do insulin she was explained that with her A1c over 15% and blood sugars probably consistently over 500+ all the time he needs to be on insulin basal bolus regimen for improving her blood sugar control, symptomatic improvement and relieving glucose toxicity She does not want to do the V-go pump  Since she does have some Basaglar at home she can start this in the mornings once a day for now, ideally she should be on Tresiba R Toujeo  Specific instructions were reviewed in detail with the patient  She will start with 40 units Basaglar and titrate this once a week by 4 units until morning sugars at least under 200  Will also target blood sugars after meals of under 250 to start with and she can use 10 units of NovoLog for her main meal  Discussed avoiding certain foods with high sugar content and also eliminating sweet drinks, discussed importance of avoiding simple sugars which would cause vicious cycle of hyperglycemia, increased thirst and further intake of sweet drinks  She  will be given a new FreeStyle monitor and explained to her how to use this and when to check her sugars  Follow-up with nurse educator and about 2 weeks  Depression: This is again untreated and she does need to be on chronic care and medications Offered her psychiatric referral and she agrees to do this   Patient Instructions  Check blood sugars on waking up  at least every other day  Also check blood sugars before your main meal and about 2-3 hours later  Recommended blood sugar levels on waking up is 90-130 and about 2 hours after meal is 130-160  Please bring your blood sugar monitor to each visit, thank you  We will need to call in test strip prescriptions for the freestyle meter  BASAGLAR insulin: This is once a day and take it in the morning regardless of whether you're going to eat  or not  Start with 40 units in the morning daily After 1 week if the morning sugar is still over 200 increase the dose to 44 and in 2 weeks if the sugar is over 200 increase the dose to 48 units  NOVOLOG insulin: Take 10 units before your main meal If blood sugars are consistently over 250 after the meal start taking 14 units from the next day onwards  Avoid sweet drinks, sweet fruits like grapes and switch to low sugar Gatorade      Counseling time on subjects discussed above is over 50% of today's 25 minute visit      Kynzlee Hucker 05/24/2017, 2:54 PM                                                   6

## 2017-05-26 ENCOUNTER — Other Ambulatory Visit: Payer: Self-pay

## 2017-05-30 ENCOUNTER — Other Ambulatory Visit: Payer: Self-pay

## 2017-06-06 ENCOUNTER — Encounter: Payer: Self-pay | Admitting: Sports Medicine

## 2017-06-06 ENCOUNTER — Ambulatory Visit (INDEPENDENT_AMBULATORY_CARE_PROVIDER_SITE_OTHER): Payer: Medicare Other | Admitting: Sports Medicine

## 2017-06-06 ENCOUNTER — Other Ambulatory Visit: Payer: Self-pay | Admitting: Endocrinology

## 2017-06-06 DIAGNOSIS — E1165 Type 2 diabetes mellitus with hyperglycemia: Secondary | ICD-10-CM

## 2017-06-06 DIAGNOSIS — L97521 Non-pressure chronic ulcer of other part of left foot limited to breakdown of skin: Secondary | ICD-10-CM | POA: Diagnosis not present

## 2017-06-06 DIAGNOSIS — Z794 Long term (current) use of insulin: Principal | ICD-10-CM

## 2017-06-06 DIAGNOSIS — E1142 Type 2 diabetes mellitus with diabetic polyneuropathy: Secondary | ICD-10-CM | POA: Diagnosis not present

## 2017-06-06 DIAGNOSIS — E11610 Type 2 diabetes mellitus with diabetic neuropathic arthropathy: Secondary | ICD-10-CM

## 2017-06-06 DIAGNOSIS — M79671 Pain in right foot: Secondary | ICD-10-CM

## 2017-06-06 MED ORDER — LIDOCAINE 5 % EX PTCH: 1.0000 | MEDICATED_PATCH | CUTANEOUS | 0 refills | Status: DC

## 2017-06-06 NOTE — Progress Notes (Signed)
Subjective: [name] is a 51 y.o. female patient seen today in office for follow up left foot pain and ulceration on left currently using iodosorbdressing to area daily at ulcer site with assistance of home nursing.  Patient reports that she has pain on right foot at outside of ankle and at 5th toe with rubbing in shoe. Denies calf pain, denies headache, chest pain, shortness of breath, nausea, vomiting, fever, or chills.   Patient reports home nurse did not come and change dressing for 1 week. No other issues noted.   Patient Active Problem List   Diagnosis Date Noted  . Cancer of sigmoid colon ([name]) 03/09/2015  . OSA (obstructive sleep apnea) 07/04/2014  . Migraine without aura, with intractable migraine, so stated, without mention of status migrainosus 03/26/2014  . Peripheral neuropathy 03/03/2014  . Abnormal stress test 02/12/2014  . CVA (cerebral infarction) 02/12/2014  . Chest pain   . Abnormal heart rhythm   . Edema   . Hypertension   . Hyperlipemia   . Hypercholesterolemia   . Other and unspecified hyperlipidemia 11/14/2013  . Type II diabetes mellitus, uncontrolled ([name]) 07/22/2013  . Essential hypertension 07/22/2013  . 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 Diabetic polyneuropathy ([place]) 07/22/2013    Current Outpatient Prescriptions on File Prior to Visit  Medication Sig Dispense Refill  . diclofenac (VOLTAREN) 75 MG EC tablet Take 1 tablet (75 mg total) by mouth 2 (two) times daily. (Patient not taking: Reported on 05/24/2017) 30 tablet 1  . gabapentin (NEURONTIN) 300 MG capsule Take 1 capsule (300 mg total) by mouth at bedtime. 90 capsule 3  . glucose blood (FREESTYLE LITE) test strip Use to check blood sugars 1-3 times daily 100 each 12  . insulin aspart (NOVOLOG) 100 UNIT/ML FlexPen Take 10 units before your main meal. If blood sugars are consistently over 250 after the meal start taking 14  units before your main meal. 15 mL 3  . Insulin Glargine (BASAGLAR KWIKPEN) 100 UNIT/ML SOPN Inject 0.1 mLs (10 Units total) into the skin every morning. And pen needles 1/day (Patient not taking: Reported on 05/24/2017) 5 pen 11  . Lancets (FREESTYLE) lancets Use to check blood sugars 1-3 times daily 100 each 12  . oxyCODONE (OXY IR/ROXICODONE) 5 MG immediate release tablet     . traMADol (ULTRAM) 50 MG tablet Take 1 tablet (50 mg total) by mouth every 8 (eight) hours as needed. (Patient not taking: Reported on 05/24/2017) 30 tablet 0   Current Facility-Administered Medications on File Prior to Visit  Medication Dose Route Frequency Provider Last Rate Last Dose  . betamethasone acetate-betamethasone sodium phosphate (CELESTONE) injection 3 mg  3 mg Intramuscular Once Edrick Kins, DPM        Allergies  Allergen Reactions  . Latex Itching, Rash and Other (See Comments)    Pt states she cannot use condoms - cause an infection.  Use of latex on skin is okay.  Tape causes rash  . Sweetening Enhancer [Flavoring Agent] Nausea And Vomiting and Other (See Comments)    HEADACHES  . Tetracyclines & Related Nausea And Vomiting and Other (See Comments)    ;YEAST INFECTIONS  . Aspartame And Phenylalanine Nausea And Vomiting    HEADACHES  . Ibuprofen Other (See Comments)    HEADACHES  . Trazodone And Nefazodone Other (See Comments)    Hallucinations   . Triazolam Other (See Comments)    HALLUCINATIONS  Objective: There were no vitals filed for this visit.  General: No acute distress, AAO  Right foot:  At right medial midfoot ulceration is well healed with no surrounding infection or re-opening, mild swelling right foot, no streaking erythema/cellulitis, no warmth, no drainage, no gross signs of infection noted, Capillary fill time <3 seconds in all digits, gross sensation present via light touch to right foot. Protective sensation absent. 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  No pain or crepitation with range of motion right foot however subjective pain at lateral ankle and 5th toe with rubbing from shoe.  No pain with calf compression.   To left foot there is partial thickness ulceration with granular base that measures 1x1x0.2cm at1st MTJ with no active drainage, unchanged with mild maceration, no other signs of infection. Subjective occasional pain to left medial ankle along PT tendon with planus changes. No reproducible pain to palpation however subjective pain by navicular as previously noted. No other issues.  Assessment and Plan:  Problem List Items Addressed This Visit      Endocrine   Diabetic polyneuropathy ([name])    Other Visit Diagnoses    Foot ulceration, left, limited to breakdown of skin ([place])    -  Primary   Diabetic Charcot foot ([place])       Foot pain, right       Relevant Medications   lidocaine (LIDODERM) 5 %     -Patient seen and evaluated -Debrided ulceration at left 1st MTPJ using sterile tissue nipper in an excisional fashion to healthy bleeding and then applied Iodosorb and coban dressing to left 1st MTPJ and recommend home nursing to continue with the same. May consider at next visit EPIFIX graft if no improvement in wound size  -Advised patient if home nursing is not coming out to house to call the office or call home nurse to check visitation schedule; dressings should be changed 3x per week -Patient is refusing steroid injection at areas of pain at ankles  -Rx Lidoderm patch for pain and recommend to continue with Gabapentin 343m tid; Patient is awaiting follow up appt with [facility] for further recommendations -Advised patient to continue with custom diabetic shoes today patient met with RLiliane Channelwho stretched her right shoe to prevent rubbing at 5th toe on right -Advised patient to limit activity to necessity  -Advised patient to elevate as necessary  -Return 4 weeks for follow up eval of left foot ulcer. In the meantime, patient  to call office if any issues or problems arise.  TLandis Martins DPM

## 2017-06-07 ENCOUNTER — Encounter: Payer: Medicare Other | Attending: Endocrinology | Admitting: Nutrition

## 2017-06-07 DIAGNOSIS — Z713 Dietary counseling and surveillance: Secondary | ICD-10-CM | POA: Diagnosis not present

## 2017-06-07 DIAGNOSIS — Z794 Long term (current) use of insulin: Secondary | ICD-10-CM | POA: Insufficient documentation

## 2017-06-07 DIAGNOSIS — E1165 Type 2 diabetes mellitus with hyperglycemia: Secondary | ICD-10-CM | POA: Insufficient documentation

## 2017-06-07 DIAGNOSIS — E1142 Type 2 diabetes mellitus with diabetic polyneuropathy: Secondary | ICD-10-CM

## 2017-06-07 NOTE — Patient Instructions (Signed)
Take 46u of Basaglar daily, Take 10u of Novolog 10 min. Before supper. Take 2-4u of Novolog every 4-5 hours during the day, if drinking sweet drinks and juices.

## 2017-06-07 NOTE — Progress Notes (Signed)
Pt. Is here with her boyfriend.   Says is taking her Basaglar, but does not know how much.  Boyfriend says 40u.  He also says she is taking her Novolog 10u ac Supper Typical day:  Pt. Only eats one meal per day--at 6-7PM of meat 2 veg. And sometimes bread. She gets up at  7AM and drinks sweet tea all day--at least 1-2 gallons daily.  After supper, she continues to drink sweet tea and orange juice-one container a day.  Says mouth "feels like cotton", and feet hurt as day progresses, and is not able to sleep at night due to pain and insomnia. Discussed the importance of drinking only unsweetened drinks, but refuses because she "can not drink artificial sweeteners--including stevia.  She refuses to drink water, or flavored waters as well. She did not bring her meter, and did not test her blood sugar this AM.  Says FBS yesterday was 382. Boyfriend says meter read high X3 days during the last 2 weeks.   Blood sugar done here today was 349. Written instructions per Dr. Ronnie Derby voice order that was reverbalized was:46 units.of Basaglar daily, and to increase dose once a week by 4u, until FBS is less than 200.  Novolog 4u q 5 hours during the day if continuing to drink sweet drinks and orange juice.   Pt said she just wants to stop taking insulin.  Told her that she would probably be dead by the end of the year. Reminded her that she lost 80 pounds over last 8 months on no insulin, and that she will continue to loose weight and her kidneys will stop working, and she will be in the hospital on dialysis if she does not take this insulin.  Reminded her that if she stops drinking sweet drinks, she will not need the Novolog during the day--except at supper meal.  She would not commit to this.

## 2017-06-08 ENCOUNTER — Other Ambulatory Visit: Payer: Self-pay

## 2017-06-08 MED ORDER — ACCU-CHEK GUIDE W/DEVICE KIT: 1.0000 | PACK | Freq: Every day | 1 refills | Status: DC

## 2017-06-08 MED ORDER — GLUCOSE BLOOD VI STRP: ORAL_STRIP | 4 refills | Status: DC

## 2017-06-23 ENCOUNTER — Telehealth: Payer: Self-pay | Admitting: Sports Medicine

## 2017-06-23 ENCOUNTER — Other Ambulatory Visit: Payer: Self-pay

## 2017-06-23 ENCOUNTER — Telehealth: Payer: Self-pay | Admitting: Endocrinology

## 2017-06-23 MED ORDER — BASAGLAR KWIKPEN 100 UNIT/ML ~~LOC~~ SOPN: 10.0000 [IU] | PEN_INJECTOR | SUBCUTANEOUS | 2 refills | Status: DC

## 2017-06-23 MED ORDER — INSULIN ASPART 100 UNIT/ML FLEXPEN: PEN_INJECTOR | SUBCUTANEOUS | 2 refills | Status: DC

## 2017-06-23 NOTE — Telephone Encounter (Signed)
[name] with [facility] called stating they have tried calling pt to schedule to go out to her home to dress her wound but they have not been able to contact the pt nor has she returned their calls.

## 2017-06-23 NOTE — Telephone Encounter (Signed)
Thank you :)

## 2017-06-23 NOTE — Telephone Encounter (Signed)
I spoke with [name] and she stated no one has called to schedule her and they have not been coming out 3 x week like they are suppose to, I gave [name] the [facility] [phone]. I reminded [name] of her appt with Dr. Cannon Kettle 07/11/2017 and Dr. Posey Pronto 08/14/2017 at [facility] [phone].

## 2017-06-23 NOTE — Telephone Encounter (Signed)
Patient calling stating she is completely out of insulin. Does not know which insulin she needs refilled, or whether it is both.  [facility]  Patient also asked for return call to inform her which is being refilled.

## 2017-06-23 NOTE — Telephone Encounter (Signed)
Spoke to the patient and she verbalized that she was out of both insulins so I ordered these for her and she stated an understanding and confirmed [facility] [address]

## 2017-06-23 NOTE — Telephone Encounter (Signed)
Thanks we will attempt to reach patient. Val can you remind the patient about home nursing and I will see her as scheduled for follow up for her wound

## 2017-06-29 ENCOUNTER — Encounter: Payer: Self-pay | Admitting: Endocrinology

## 2017-06-29 ENCOUNTER — Ambulatory Visit (INDEPENDENT_AMBULATORY_CARE_PROVIDER_SITE_OTHER): Payer: Medicare Other | Admitting: Endocrinology

## 2017-06-29 ENCOUNTER — Other Ambulatory Visit: Payer: Self-pay

## 2017-06-29 VITALS — BP 118/78 | HR 81 | Ht 65.0 in | Wt 174.2 lb

## 2017-06-29 DIAGNOSIS — E1165 Type 2 diabetes mellitus with hyperglycemia: Secondary | ICD-10-CM | POA: Diagnosis not present

## 2017-06-29 DIAGNOSIS — Z794 Long term (current) use of insulin: Secondary | ICD-10-CM

## 2017-06-29 DIAGNOSIS — E871 Hypo-osmolality and hyponatremia: Secondary | ICD-10-CM

## 2017-06-29 DIAGNOSIS — F329 Major depressive disorder, single episode, unspecified: Secondary | ICD-10-CM

## 2017-06-29 DIAGNOSIS — F32A Depression, unspecified: Secondary | ICD-10-CM

## 2017-06-29 MED ORDER — ACCU-CHEK NANO SMARTVIEW W/DEVICE KIT: PACK | 2 refills | Status: DC

## 2017-06-29 MED ORDER — GLUCOSE BLOOD VI STRP
ORAL_STRIP | 3 refills | Status: DC
Start: 2017-06-29 — End: 2017-09-09

## 2017-06-29 MED ORDER — CANAGLIFLOZIN 300 MG PO TABS: 300.0000 mg | ORAL_TABLET | Freq: Every day | ORAL | 3 refills | Status: DC

## 2017-06-29 NOTE — Progress Notes (Signed)
Patient ID: [name], female   DOB: 18-Nov-1966, 51 y.o.   MRN: [id number]   Reason for Appointment: Diabetes follow-up    History of Present Illness:   Diagnosis: Type 2 diabetes mellitus, date of diagnosis: 2004.  PAST history: She has been treated mostly with insulin since about a year after diagnosis. She has had difficulty with consistent compliance with diet and also compliance with self care including glucose monitoring over the years. She had been mostly treated with basal insulin. Also had been tried on mealtime insulin but she would be noncompliant with this. Was tried on Prandin for mealtime control but difficult to judge efficacy because of lack of postprandial monitoring. Was given Victoza to start in 2011 but did not follow up after this.She was tried on premixed insulin but this did not help her control, mostly because of noncompliance with the doses. She had been using the V- go pump and had better control initially and was better compliant with the daily routine and boluses. She has had frequent education visits also. She stopped using her V.-go pump in 6/15 because of discomfort at the site of application Also did not improve with a trial of Victoza   RECENT history:    INSULIN dose: Basaglar 40 units twice a day Non-insulin hypoglycemic drugs: None  Her most recent A1c in 5/18 was over 15  Current management, problems identified:   She has been restarted on insulin since her last visit and this is being given by her boyfriend since she has refused to take any insulin on her own  However despite specific instructions by myself and the nurse educator he apparently is giving her Basaglar insulin twice a day instead of once a day and only getting NovoLog 10 units with her main meal which is usually once a day  She is however still complaining of significant thirst and continues to drink drinks with sugar including soft drinks and sweet tea throughout the  day  Blood sugar at home as variable in the morning but still relatively high  Blood sugars are generally higher in the afternoons and only occasionally below 300   She reports that occasionally at 4 AM she will feel weak and shaky and blood sugar may be 60 but this is not documented recently  She is concerned about weight gain with insulin   Glucometer:  FreeStyle.  Blood Glucose readings:    Mean values apply above for all meters except median for One Touch  PRE-MEAL Mornings  Lunch Afternoon  Bedtime Overall  Glucose range: 134-403    21 5-441     Mean/median:     293     Hypoglycemia frequency: Never.    Food preferences: eating 1 or 2 meals per day, variable intake, sandwiches at times or otherwise snacks, still getting regular drinks and sweet tea  Physical activity: exercise: Minimal.  She has difficulties with leg pain and weakness  Certified Diabetes Educator visit: Most recent:, 2/14.     Wt Readings from Last 3 Encounters:  06/29/17 174 lb 3.2 oz (79 kg)  05/24/17 164 lb 9.6 oz (74.7 kg)  05/05/17 171 lb 9.6 oz (77.8 kg)   DM labs:   Lab Results  Component Value Date   HGBA1C >15 05/24/2017   HGBA1C 10.1 12/28/2016   HGBA1C 9.7 (H) 09/05/2016   Lab Results  Component Value Date   MICROALBUR 0.4 10/19/2016   LDLCALC 41 01/01/2016   CREATININE 1.1 05/04/2017  Other active problems: See review of systems    Allergies as of 06/29/2017      Reactions   Latex Itching, Rash, Other (See Comments)   Pt states she cannot use condoms - cause an infection.  Use of latex on skin is okay.  Tape causes rash   Sweetening Enhancer [flavoring Agent] Nausea And Vomiting, Other (See Comments)   HEADACHES   Tetracyclines & Related Nausea And Vomiting, Other (See Comments)   ;YEAST INFECTIONS   Aspartame And Phenylalanine Nausea And Vomiting   HEADACHES   Ibuprofen Other (See Comments)   HEADACHES   Trazodone And Nefazodone Other (See Comments)    Hallucinations   Triazolam Other (See Comments)   HALLUCINATIONS      Medication List       Accurate as of 06/29/17 11:59 PM. Always use your most recent med list.          ACCU-CHEK NANO SMARTVIEW w/Device Kit Use to check blood sugars daily. Dx code E11.65   BASAGLAR KWIKPEN 100 UNIT/ML Sopn Inject 0.1 mLs (10 Units total) into the skin every morning. And pen needles 1/day   canagliflozin 300 MG Tabs tablet Commonly known as:  INVOKANA Take 1 tablet (300 mg total) by mouth daily before breakfast.   diclofenac 75 MG EC tablet Commonly known as:  VOLTAREN Take 1 tablet (75 mg total) by mouth 2 (two) times daily.   freestyle lancets Use to check blood sugars 1-3 times daily   gabapentin 300 MG capsule Commonly known as:  NEURONTIN Take 1 capsule (300 mg total) by mouth at bedtime.   glucose blood test strip Commonly known as:  ACCU-CHEK SMARTVIEW Use to check blood sugars 3 times daily, Dx Code E11.65   insulin aspart 100 UNIT/ML FlexPen Commonly known as:  NOVOLOG Take 10 units before your main meal. If blood sugars are consistently over 250 after the meal start taking 14 units before your main meal.   lidocaine 5 % Commonly known as:  LIDODERM Place 1 patch onto the skin daily. Remove & Discard patch within 12 hours or as directed by MD   oxyCODONE 5 MG immediate release tablet Commonly known as:  Oxy IR/ROXICODONE   traMADol 50 MG tablet Commonly known as:  ULTRAM Take 1 tablet (50 mg total) by mouth every 8 (eight) hours as needed.       Allergies:  Allergies  Allergen Reactions  . Latex Itching, Rash and Other (See Comments)    Pt states she cannot use condoms - cause an infection.  Use of latex on skin is okay.  Tape causes rash  . Sweetening Enhancer [Flavoring Agent] Nausea And Vomiting and Other (See Comments)    HEADACHES  . Tetracyclines & Related Nausea And Vomiting and Other (See Comments)    ;YEAST INFECTIONS  . Aspartame And Phenylalanine  Nausea And Vomiting    HEADACHES  . Ibuprofen Other (See Comments)    HEADACHES  . Trazodone And Nefazodone Other (See Comments)    Hallucinations   . Triazolam Other (See Comments)    HALLUCINATIONS    Past Medical History:  Diagnosis Date  . Anginal pain ([place])   . Anxiety   . Arthritis    "qwhere" (02/12/2014)  . Bipolar disorder ([place])   . Cancer [facility])    colon cancer  . Chest pain    a. 2008 Cath: normal cors;  b. 12/2013 [facility] CL: EF 47%, no ischemia/infarct.  . Cholecystitis   . Chronic back pain   .  Chronic pain   . Claudication ([place])    a. 12/2013 ABI's: R 0.97, L 0.94.  [name] COPD (chronic obstructive pulmonary disease) ([place])   . Depression   . Diabetic foot ulcer ([facility])    chronic  . DJD (degenerative joint disease)   . Edema   . Gastroparesis   . GERD (gastroesophageal reflux disease)   . H/O hiatal hernia   . QMVHQION(629.5)    "weekly" (02/12/2014)  . History of blood transfusion 1986   "related to lost a child" (02/12/2014)  . Hypercholesterolemia   . Hyperlipemia   . Hypersomnia   . Hypertension   . Insomnia   . MRSA (methicillin resistant Staphylococcus aureus)   . Neurogenic bladder   . OSA (obstructive sleep apnea) 07/04/2014  . Palpitations   . Personality disorder   . Pneumonia    "twice, I think" (02/12/2014)  . Sleep apnea    didn't tolerate cpap (02/12/2014)  . SOB (shortness of breath) on exertion   . Stroke ([place]) 02/12/2014   "eyesight is messed up; not steady on my feet" (02/12/2014)  . SUI (stress urinary incontinence, female) S/P SLING 12-29-2011  . Tobacco abuse   . Type II diabetes mellitus ([place])   . Ulcer   . Vertigo     Past Surgical History:  Procedure Laterality Date  . ABDOMINAL HYSTERECTOMY    . ANTERIOR CERVICAL DECOMP/DISCECTOMY FUSION  2000   C6 - 7  . CARDIAC CATHETERIZATION  05-22-2008   DR SKAINS   NO SIGNIFECANT CAD/ NORMAL LV/  EF 65%/  NO WALL MOTION ABNORMALITIES  . CARDIAC CATHETERIZATION  02/12/2014  . CARPAL TUNNEL  RELEASE Right 04-25-2013  . [place]; 1992  . CYSTO N/A 04/30/2013   Procedure: CYSTOSCOPY;  Surgeon: Reece Packer, MD;  Location: [facility];  Service: Urology;  Laterality: N/A;  . CYSTOSCOPY WITH INJECTION  05/04/2012   Procedure: CYSTOSCOPY WITH INJECTION;  Surgeon: Reece Packer, MD;  Location: [place];  Service: Urology;  Laterality: N/A;  MACROPLASTIQUE INJECTION  . CYSTOSCOPY WITH INJECTION  08/28/2012   Procedure: CYSTOSCOPY WITH INJECTION;  Surgeon: Reece Packer, MD;  Location: [facility];  Service: Urology;  Laterality: N/A;  cysto and macroplastique   . FOOT SURGERY Bilateral   . HERNIA REPAIR  ?1996   "stomach"  . KNEE ARTHROSCOPY W/ ALLOGRAFT IMPANT Left    graft x 2  . KNEE SURGERY     TOTAL 8 SURG'S  . LAPAROSCOPIC SIGMOID COLECTOMY  04/22/2015  . LAPAROSCOPIC SIGMOID COLECTOMY N/A 04/22/2015   Procedure: LAPAROSCOPIC HAND ASSISTED SIGMOID COLECTOMY;  Surgeon: Erroll Luna, MD;  Location: [place];  Service: General;  Laterality: N/A;  . LEFT HEART CATHETERIZATION WITH CORONARY ANGIOGRAM N/A 02/12/2014   Procedure: LEFT HEART CATHETERIZATION WITH CORONARY ANGIOGRAM;  Surgeon: Candee Furbish, MD;  Location[facility];  Service: Cardiovascular;  Laterality: N/A;  . LUMBAR FUSION     "cage in my spine"  . MULTIPLE LAPAROSCOPIES FOR ENDOMETRIOSIS    . PUBOVAGINAL SLING  12/29/2011   Procedure: Gaynelle Arabian;  Surgeon: Reece Packer, MD;  Location: [facility];  Service: Urology;  Laterality: N/A;  cysto and sparc sling   . PUBOVAGINAL SLING N/A 04/30/2013   Procedure: REMOVAL OF VAGINAL MESH;  Surgeon: Reece Packer, MD;  Location: [facility];  Service: Urology;  Laterality: N/A;  . RECONSTURCTION OF CONGENITAL UTERUS ANOMALY  1983  . REPEAT RECONSTRUCTION ACL LEFT KNEE/  SCREWS REMOVED  03-28-2000   CADAVER GRAFT  . TOTAL ABDOMINAL HYSTERECTOMY W/ BILATERAL SALPINGOOPHORECTOMY  1997  . WOUND  DEBRIDEMENT Right 09/05/2016   Procedure: DEBRIDEMENT WOUND WITH GRAFT RIGHT FOOT;  Surgeon: Landis Martins, DPM;  Location: [place];  Service: Podiatry;  Laterality: Right;    Family History  Problem Relation Age of Onset  . Hypertension Mother   . Diabetes Mother   . Cancer - Other Mother        lymphoma   . Cancer - Other Father        lung, bladder cancer   . Heart attack Father   . Cancer - Other Brother        bladder cancer     Social History:  reports that she has been smoking Cigarettes and E-cigarettes.  She has a 82.00 pack-year smoking history. She has never used smokeless tobacco. She reports that she does not drink alcohol or use drugs.  Review of Systems -   DEPRESSION:This has been significant.  Not on treatment, Has difficulty sleeping and is Now Refusing to see a psychiatrist  She has history of high triglycerides previously treated with fenofibrate   Currently she is not taking Lipitor 40 mg previously prescribed for high LDL  Lab Results  Component Value Date   CHOL 111 12/28/2016   HDL 34.10 (L) 12/28/2016   LDLCALC 41 01/01/2016   LDLDIRECT 46.0 12/28/2016   TRIG 291.0 (H) 12/28/2016   CHOLHDL 3 12/28/2016     HYPOKALEMIA:  Her last potassium was normal, not on any supplements or spironolactone   Lab Results  Component Value Date   CREATININE 1.1 05/04/2017   BUN 5.8 (L) 05/04/2017   NA 127 (L) 05/04/2017   K 4.2 05/04/2017   CL 97 12/28/2016   CO2 21 (L) 05/04/2017    Painful neuropathy: She has had persistent symptoms including leg pains and numbness as well as difficulty with balance  Previously has tried various drugs including gabapentin without much relief   Foot exam Last showed absent pedal pulses and distal sensation  Followed by podiatrist  She is not complaining of as much fatigue recently   Examination:   BP 118/78   Pulse 81   Ht _0  (1.651 m)   Wt 174 lb 3.2 oz (79 kg)   SpO2 98%   BMI 28.99 kg/m   Body mass index  is 28.99 kg/m.      Assesment/Plan:   Diabetes type 2, with persistently poor control, BMI 35 See history of present illness for detailed discussion of current diabetes management, blood sugar patterns and problems identified  She has had poor control and appears that her insulin deficiency is more significant and she is requiring larger doses of insulin This is partly related to glucose toxicity, poor compliance with diet, continued intake of large amount of simple sugars and not taking enough rapid acting insulin to control postprandial hyperglycemia All these factors were discussed with her and her boyfriend She is not understanding the difference between Basaglar and NovoLog  Currently taking too much basal insulin and minimal insulin to control her carbohydrate and sugar intake This is resulting in occasional overnight hypoglycemia but blood sugars are progressively higher from morning to evening She is reluctant to take insulin because she thinks makes her gain weight However she has been open to having her boyfriend administer her insulin and following instructions given here  Specific instructions were reviewed in detail with the patient  She  will take Basaglar once a day for convenience in the morning only since she tends to have high readings during the daytime anyway  Explained to her that this is only basal insulin  She will take 10 units of Novolog in the morning, midday and 20 units with her main meals and empirically this should improve her postprandial control  More readings after supper to be done  To improve her insulin sensitivity and reduced insulin requirement as well as prevent further weight gain she was started back on Invokana and she agrees to do this  Will need short-term follow-up to reassess her progress  Again emphasized the need to cut back on simple sugars but she is not going to do this now  Depression: She was referred to psychiatrist but she has  refused to go, still has significant depression and lack of motivation  Previous electrolyte disturbances: Will need follow-up  Hyperlipidemia: She has refused treatment, will need follow-up labs   Patient Instructions   Take 60 u of Basaglar daily, only in am  Take 10u of Novolog with 1st drink in am and again 4-5 hrs later    Take NOVOLOG  20 units daily at supper.    Counseling time on subjects discussed in assessment and plan sections is over 50% of today's 25 minute visit      Harlen Danford 06/30/2017, 2:59 PM                                                   6

## 2017-06-29 NOTE — Patient Instructions (Addendum)
Take 60 u of Basaglar daily, only in am  Take 10u of Novolog with 1st drink in am and again 4-5 hrs later    Take NOVOLOG  20 units daily at supper.

## 2017-07-11 ENCOUNTER — Ambulatory Visit (INDEPENDENT_AMBULATORY_CARE_PROVIDER_SITE_OTHER): Payer: Medicare Other | Admitting: Sports Medicine

## 2017-07-11 DIAGNOSIS — L97521 Non-pressure chronic ulcer of other part of left foot limited to breakdown of skin: Secondary | ICD-10-CM | POA: Diagnosis not present

## 2017-07-11 DIAGNOSIS — M779 Enthesopathy, unspecified: Secondary | ICD-10-CM

## 2017-07-11 DIAGNOSIS — E11610 Type 2 diabetes mellitus with diabetic neuropathic arthropathy: Secondary | ICD-10-CM

## 2017-07-11 DIAGNOSIS — E1142 Type 2 diabetes mellitus with diabetic polyneuropathy: Secondary | ICD-10-CM

## 2017-07-11 DIAGNOSIS — M79672 Pain in left foot: Secondary | ICD-10-CM

## 2017-07-11 MED ORDER — TRIAMCINOLONE ACETONIDE 10 MG/ML IJ SUSP: 10.0000 mg | Freq: Once | INTRAMUSCULAR | Status: DC

## 2017-07-11 NOTE — Progress Notes (Signed)
Subjective: [name] is a 51 y.o. female patient seen today in office for follow up left foot pain and ulceration on left currently using iodosorb dressing to area daily at ulcer site with assistance of home nursing and boyfriend.  Patient reports that she has pain on left foot at outside of ankle and at bone at arch on right foot with rubbing in shoe. Denies calf pain, denies headache, chest pain, shortness of breath, nausea, vomiting, fever, or chills. No other issues noted.   Patient Active Problem List   Diagnosis Date Noted  . Cancer of sigmoid colon ([place]) 03/09/2015  . OSA (obstructive sleep apnea) 07/04/2014  . Migraine without aura, with intractable migraine, so stated, without mention of status migrainosus 03/26/2014  . Peripheral neuropathy 03/03/2014  . Abnormal stress test 02/12/2014  . CVA (cerebral infarction) 02/12/2014  . Chest pain   . Abnormal heart rhythm   . Edema   . Hypertension   . Hyperlipemia   . Hypercholesterolemia   . Other and unspecified hyperlipidemia 11/14/2013  . Type II diabetes mellitus, uncontrolled ([place]) 07/22/2013  . Essential hypertension 07/22/2013  . 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 Diabetic polyneuropathy ([place]) 07/22/2013    Current Outpatient Prescriptions on File Prior to Visit  Medication Sig Dispense Refill  . Blood Glucose Monitoring Suppl (ACCU-CHEK NANO SMARTVIEW) w/Device KIT Use to check blood sugars daily. Dx code E11.65 1 kit 2  . canagliflozin (INVOKANA) 300 MG TABS tablet Take 1 tablet (300 mg total) by mouth daily before breakfast. 30 tablet 3  . diclofenac (VOLTAREN) 75 MG EC tablet Take 1 tablet (75 mg total) by mouth 2 (two) times daily. (Patient not taking: Reported on 05/24/2017) 30 tablet 1  . gabapentin (NEURONTIN) 300 MG capsule Take 1 capsule (300 mg total) by mouth at bedtime. (Patient not taking: Reported on 06/29/2017) 90 capsule 3  .  glucose blood (ACCU-CHEK SMARTVIEW) test strip Use to check blood sugars 3 times daily, Dx Code E11.65 100 each 3  . insulin aspart (NOVOLOG) 100 UNIT/ML FlexPen Take 10 units before your main meal. If blood sugars are consistently over 250 after the meal start taking 14 units before your main meal. 45 mL 2  . Insulin Glargine (BASAGLAR KWIKPEN) 100 UNIT/ML SOPN Inject 0.1 mLs (10 Units total) into the skin every morning. And pen needles 1/day (Patient taking differently: Inject 40 Units into the skin 3 (three) times daily. And pen needles 1/day) 15 pen 2  . Lancets (FREESTYLE) lancets Use to check blood sugars 1-3 times daily 100 each 12  . lidocaine (LIDODERM) 5 % Place 1 patch onto the skin daily. Remove & Discard patch within 12 hours or as directed by MD 30 patch 0  . oxyCODONE (OXY IR/ROXICODONE) 5 MG immediate release tablet     . traMADol (ULTRAM) 50 MG tablet Take 1 tablet (50 mg total) by mouth every 8 (eight) hours as needed. (Patient not taking: Reported on 05/24/2017) 30 tablet 0   Current Facility-Administered Medications on File Prior to Visit  Medication Dose Route Frequency Provider Last Rate Last Dose  . betamethasone acetate-betamethasone sodium phosphate (CELESTONE) injection 3 mg  3 mg Intramuscular Once Edrick Kins, DPM        Allergies  Allergen Reactions  . Latex Itching, Rash and Other (See Comments)    Pt states she cannot use condoms - cause an infection.  Use of latex on skin is  okay.  Tape causes rash  . Sweetening Enhancer [Flavoring Agent] Nausea And Vomiting and Other (See Comments)    HEADACHES  . Tetracyclines & Related Nausea And Vomiting and Other (See Comments)    ;YEAST INFECTIONS  . Aspartame And Phenylalanine Nausea And Vomiting    HEADACHES  . Ibuprofen Other (See Comments)    HEADACHES  . Trazodone And Nefazodone Other (See Comments)    Hallucinations   . Triazolam Other (See Comments)    HALLUCINATIONS    Objective: There were no vitals  filed for this visit.  General: No acute distress, AAO  Right foot:  At right medial midfoot ulceration is well healed with no surrounding infection or re-opening, mild swelling right foot, no streaking erythema/cellulitis, no warmth, no drainage, no gross signs of infection noted, Capillary fill time <3 seconds in all digits, gross sensation present via light touch to right foot. Protective sensation absent. 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 No pain or crepitation with range of motion right foot.  No pain with calf compression.   To left foot there is partial thickness ulceration with granular base that measures 1x0.5x0.2cm improving in size at1st MTJ with no active drainage, unchanged with mild maceration, no other signs of infection. Subjective occasional pain to left medial ankle along PT tendon with planus changes and now new pain at peroneal tendons which is most symptomatic at the level of ankle. No other issues.  Assessment and Plan:  Problem List Items Addressed This Visit      Endocrine   Diabetic polyneuropathy ([place])    Other Visit Diagnoses    Foot ulceration, left, limited to breakdown of skin ([facility])    -  Primary   Tendonitis       Relevant Medications   triamcinolone acetonide (KENALOG) 10 MG/ML injection 10 mg (Start on 07/11/2017  9:00 PM)   Diabetic Charcot foot ([facility])       Relevant Medications   triamcinolone acetonide (KENALOG) 10 MG/ML injection 10 mg (Start on 07/11/2017  9:00 PM)   Left foot pain       Relevant Medications   triamcinolone acetonide (KENALOG) 10 MG/ML injection 10 mg (Start on 07/11/2017  9:00 PM)     -Patient seen and evaluated -Debrided ulceration at left 1st MTPJ using sterile tissue nipper in an excisional fashion to healthy bleeding and then applied Iodosorb and coban dressing to left 1st MTPJ and recommend home nursing to continue with the same. May consider at next visit EPIFIX graft if no continued improvement in wound size   -After oral consent and aseptic prep, injected a mixture containing 1 ml of 2%  plain lidocaine, 1 ml 0.5% plain marcaine, 0.5 ml of kenalog 10 and 0.5 ml of dexamethasone phosphate into left lateral ankle without complication. Post-injection care discussed with patient.  -Continue with Lidoderm patch for pain and recommend to continue with Gabapentin 334m tid; Patient is awaiting follow up appt with [facility] for further recommendations -Advised patient to continue with custom diabetic shoes -Advised patient to limit activity to necessity  -Advised patient to elevate as necessary  -Return 4 weeks for follow up eval of left foot ulcer. In the meantime, patient to call office if any issues or problems arise.  TLandis Martins DPM

## 2017-08-01 ENCOUNTER — Ambulatory Visit (INDEPENDENT_AMBULATORY_CARE_PROVIDER_SITE_OTHER): Payer: Medicare Other | Admitting: Sports Medicine

## 2017-08-01 DIAGNOSIS — L97521 Non-pressure chronic ulcer of other part of left foot limited to breakdown of skin: Secondary | ICD-10-CM

## 2017-08-01 DIAGNOSIS — E1142 Type 2 diabetes mellitus with diabetic polyneuropathy: Secondary | ICD-10-CM

## 2017-08-01 DIAGNOSIS — M79672 Pain in left foot: Secondary | ICD-10-CM

## 2017-08-01 DIAGNOSIS — M79671 Pain in right foot: Secondary | ICD-10-CM

## 2017-08-01 DIAGNOSIS — M779 Enthesopathy, unspecified: Secondary | ICD-10-CM

## 2017-08-01 DIAGNOSIS — E11610 Type 2 diabetes mellitus with diabetic neuropathic arthropathy: Secondary | ICD-10-CM

## 2017-08-01 NOTE — Progress Notes (Signed)
Subjective: [name] is a 51 y.o. female patient seen today in office for follow up left foot pain and ulceration on left currently using iodosorb dressing to area daily at ulcer site with assistance of home nursing and boyfriend. Reports that home nursing is not coming like that should.  Patient reports that she has pain on left foot at outside of ankle and at bone at arch; went to see another doctor who put her in a CAM boot and now has a new blister on left. Reports pain at the bottom on right foot with rubbing in shoe. Denies calf pain, denies headache, chest pain, shortness of breath, nausea, vomiting, fever, or chills. No other issues noted.   Patient Active Problem List   Diagnosis Date Noted  . Cancer of sigmoid colon ([place]) 03/09/2015  . OSA (obstructive sleep apnea) 07/04/2014  . Migraine without aura, with intractable migraine, so stated, without mention of status migrainosus 03/26/2014  . Peripheral neuropathy 03/03/2014  . Abnormal stress test 02/12/2014  . CVA (cerebral infarction) 02/12/2014  . Chest pain   . Abnormal heart rhythm   . Edema   . Hypertension   . Hyperlipemia   . Hypercholesterolemia   . Other and unspecified hyperlipidemia 11/14/2013  . Type II diabetes mellitus, uncontrolled ([place]) 07/22/2013  . Essential hypertension 07/22/2013  . 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 Diabetic polyneuropathy ([place]) 07/22/2013    Current Outpatient Prescriptions on File Prior to Visit  Medication Sig Dispense Refill  . Blood Glucose Monitoring Suppl (ACCU-CHEK NANO SMARTVIEW) w/Device KIT Use to check blood sugars daily. Dx code E11.65 1 kit 2  . canagliflozin (INVOKANA) 300 MG TABS tablet Take 1 tablet (300 mg total) by mouth daily before breakfast. 30 tablet 3  . diclofenac (VOLTAREN) 75 MG EC tablet Take 1 tablet (75 mg total) by mouth 2 (two) times daily. (Patient not taking: Reported on  05/24/2017) 30 tablet 1  . gabapentin (NEURONTIN) 300 MG capsule Take 1 capsule (300 mg total) by mouth at bedtime. (Patient not taking: Reported on 06/29/2017) 90 capsule 3  . glucose blood (ACCU-CHEK SMARTVIEW) test strip Use to check blood sugars 3 times daily, Dx Code E11.65 100 each 3  . insulin aspart (NOVOLOG) 100 UNIT/ML FlexPen Take 10 units before your main meal. If blood sugars are consistently over 250 after the meal start taking 14 units before your main meal. 45 mL 2  . Insulin Glargine (BASAGLAR KWIKPEN) 100 UNIT/ML SOPN Inject 0.1 mLs (10 Units total) into the skin every morning. And pen needles 1/day (Patient taking differently: Inject 40 Units into the skin 3 (three) times daily. And pen needles 1/day) 15 pen 2  . Lancets (FREESTYLE) lancets Use to check blood sugars 1-3 times daily 100 each 12  . lidocaine (LIDODERM) 5 % Place 1 patch onto the skin daily. Remove & Discard patch within 12 hours or as directed by MD 30 patch 0  . oxyCODONE (OXY IR/ROXICODONE) 5 MG immediate release tablet     . traMADol (ULTRAM) 50 MG tablet Take 1 tablet (50 mg total) by mouth every 8 (eight) hours as needed. (Patient not taking: Reported on 05/24/2017) 30 tablet 0   Current Facility-Administered Medications on File Prior to Visit  Medication Dose Route Frequency Provider Last Rate Last Dose  . betamethasone acetate-betamethasone sodium phosphate (CELESTONE) injection 3 mg  3 mg Intramuscular Once Edrick Kins, DPM      .  triamcinolone acetonide (KENALOG) 10 MG/ML injection 10 mg  10 mg Other Once Landis Martins, DPM        Allergies  Allergen Reactions  . Latex Itching, Rash and Other (See Comments)    Pt states she cannot use condoms - cause an infection.  Use of latex on skin is okay.  Tape causes rash  . Sweetening Enhancer [Flavoring Agent] Nausea And Vomiting and Other (See Comments)    HEADACHES  . Tetracyclines & Related Nausea And Vomiting and Other (See Comments)    ;YEAST INFECTIONS   . Aspartame And Phenylalanine Nausea And Vomiting    HEADACHES  . Ibuprofen Other (See Comments)    HEADACHES  . Trazodone And Nefazodone Other (See Comments)    Hallucinations   . Triazolam Other (See Comments)    HALLUCINATIONS    Objective: There were no vitals filed for this visit.  General: No acute distress, AAO; Patient expressed that she is tired of having issues with pain in feet and wound on left  Right foot:  At right medial midfoot ulceration is well healed with no surrounding infection or re-opening, mild swelling right foot, no streaking erythema/cellulitis, no warmth, no drainage, no gross signs of infection noted, Capillary fill time <3 seconds in all digits, gross sensation present via light touch to right foot. Protective sensation absent. 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 No pain or crepitation with range of motion right foot.  No pain with calf compression.   To left foot there is partial thickness ulceration with granular base that measures 1x1x0.3cm larger in size sub met 1 with no active drainage, unchanged with mild maceration, no other signs of infection. Subjective occasional pain to left medial ankle along PT tendon with planus changes and continued pain at peroneal tendons. No other issues.  Assessment and Plan:  Problem List Items Addressed This Visit    None    Visit Diagnoses    Foot ulceration, left, limited to breakdown of skin ([place])    -  Primary   Tendonitis       Diabetic polyneuropathy associated with type 2 diabetes mellitus ([place])       Diabetic Charcot foot ([place])       Left foot pain       Foot pain, right         -Patient seen and evaluated -Debrided ulceration at left sub met 1 using sterile tissue nipper in an excisional fashion to healthy bleeding and then applied Iodosorb offloading pad and coban dressing to left 1st MTPJ and recommend home nursing to continue with the same. Request was placed for  Epifix and request for MRI to further evaluate the wound and underlying bone at this ulcer site -Advised patient to discontinue CAM boot due to irritation and new blister of which applied antibiotic cream to and advised patient and nursing to do the same -Continue with Lidoderm patch for pain and recommend to continue with Gabapentin 33m tid; Patient is awaiting follow up appt with [facility] for further recommendations has an appt on 08-14-17. I advised patient that I can not give anything for pain like NARCOTICS -Advised patient to continue with custom diabetic shoes; further offloaded the right shoe insole at today's visit  -Advised patient to limit activity to necessity  -Advised patient to elevate as necessary  -Return 3-4 weeks for follow up eval of left foot ulcer possible graft and discuss MRI results. In the meantime, patient to call office  if any issues or problems arise.  Landis Martins, DPM

## 2017-08-02 ENCOUNTER — Telehealth: Payer: Self-pay | Admitting: *Deleted

## 2017-08-02 DIAGNOSIS — M79672 Pain in left foot: Secondary | ICD-10-CM

## 2017-08-02 DIAGNOSIS — L97521 Non-pressure chronic ulcer of other part of left foot limited to breakdown of skin: Secondary | ICD-10-CM

## 2017-08-02 DIAGNOSIS — E11610 Type 2 diabetes mellitus with diabetic neuropathic arthropathy: Secondary | ICD-10-CM

## 2017-08-02 DIAGNOSIS — E1142 Type 2 diabetes mellitus with diabetic polyneuropathy: Secondary | ICD-10-CM

## 2017-08-02 NOTE — Telephone Encounter (Addendum)
-----  Message from [place], [place] sent at 08/01/2017  5:11 PM EDT ----- Regarding: MRI on left and epifix MRI left sub met 1 chronic ulcer  1.2x1.5cm left sub met ulcer epifix disk  -Dr. S.08/02/2017-Faxed required form, clinicals x6, and demographics to [facility]. [facility] - MEDICAID "THIS [place] MEDICAID MEMBER DOES NOT REQUIRE PRIOR AUTHORIZATION FOR OUTPATIENT RADIOLOGY THROUGH [facility] OR [place] [facility] AT THIS TIME." Faxed MRI orders to [place].08/14/2017-Reviewed [facility] email stating pt's cost per graft would be $200.00.08/15/2017-I left message pt's home phone that I was calling with information concering the graft Dr. Cannon Kettle wanted for her. I informed pt that after her insurance was reviewed she would need to pay $200.00 per graft with 5 graft being the average. Pt states she would like to think about it and asked for the MRI results. I told her I would inform Dr. Cannon Kettle the MRI results were available and call again.

## 2017-08-03 ENCOUNTER — Ambulatory Visit
Admission: RE | Admit: 2017-08-03 | Discharge: 2017-08-03 | Disposition: A | Discharge status: Home / Self Care | Payer: Medicare Other | Source: Ambulatory Visit | Attending: Sports Medicine | Admitting: Sports Medicine

## 2017-08-08 ENCOUNTER — Ambulatory Visit (INDEPENDENT_AMBULATORY_CARE_PROVIDER_SITE_OTHER): Payer: Medicare Other | Admitting: Endocrinology

## 2017-08-08 ENCOUNTER — Encounter: Payer: Self-pay | Admitting: Endocrinology

## 2017-08-08 VITALS — BP 130/80 | HR 82 | Ht 65.0 in | Wt 177.0 lb

## 2017-08-08 DIAGNOSIS — E782 Mixed hyperlipidemia: Secondary | ICD-10-CM

## 2017-08-08 DIAGNOSIS — E1142 Type 2 diabetes mellitus with diabetic polyneuropathy: Secondary | ICD-10-CM

## 2017-08-08 DIAGNOSIS — E876 Hypokalemia: Secondary | ICD-10-CM | POA: Diagnosis not present

## 2017-08-08 DIAGNOSIS — Z794 Long term (current) use of insulin: Secondary | ICD-10-CM

## 2017-08-08 DIAGNOSIS — E1165 Type 2 diabetes mellitus with hyperglycemia: Secondary | ICD-10-CM | POA: Diagnosis not present

## 2017-08-08 LAB — BASIC METABOLIC PANEL
BUN: 7 mg/dL (ref 6–23)
CALCIUM: 9.2 mg/dL (ref 8.4–10.5)
CO2: 30 mEq/L (ref 19–32)
CREATININE: 0.52 mg/dL (ref 0.40–1.20)
Chloride: 108 mEq/L (ref 96–112)
GFR: 131.51 mL/min (ref >60.00)
Glucose, Bld: 96 mg/dL (ref 70–99)
Potassium: 4 mEq/L (ref 3.5–5.1)
Sodium: 141 mEq/L (ref 135–145)

## 2017-08-08 LAB — POCT GLYCOSYLATED HEMOGLOBIN (HGB A1C): HEMOGLOBIN A1C: 8.8

## 2017-08-08 LAB — LIPID PANEL
CHOLESTEROL: 103 mg/dL (ref 0–200)
HDL: 38 mg/dL — ABNORMAL LOW (ref >39.00)
LDL Cholesterol: 41 mg/dL (ref 0–99)
NonHDL: 64.84
TRIGLYCERIDES: 118 mg/dL (ref 0.0–149.0)
Total CHOL/HDL Ratio: 3
VLDL: 23.6 mg/dL (ref 0.0–40.0)

## 2017-08-08 LAB — GLUCOSE, POCT (MANUAL RESULT ENTRY): POC GLUCOSE: 110 mg/dL — AB (ref 70–99)

## 2017-08-08 NOTE — Patient Instructions (Addendum)
CHECKING blood sugars:  Check blood sugar in the morning on waking up at least every other day  Also check blood sugars periodically at noon time and before supper  May check blood sugar about 2 hours after evening meal also  Please bring blood sugar monitor for each visit  BASAGLAR insulin: Take this only once a day now, starting with 40 units  NOVOLOG: Take 20 units before supper time for regular meals and 15 units for smaller meals  If blood sugar is over 150 in the morning take 10 units of Novolog when drinking Coke otherwise may skip this  Please ask [facility] to fill the Invokana prescription, this is already at the drugstore

## 2017-08-08 NOTE — Progress Notes (Signed)
Patient ID: [name], female   DOB: 04-18-1966, 51 y.o.   MRN: [id number]   Reason for Appointment: Diabetes follow-up    History of Present Illness:   Diagnosis: Type 2 diabetes mellitus, date of diagnosis: 2004.  PAST history: She has been treated mostly with insulin since about a year after diagnosis. She has had difficulty with consistent compliance with diet and also compliance with self care including glucose monitoring over the years. She had been mostly treated with basal insulin. Also had been tried on mealtime insulin but she would be noncompliant with this. Was tried on Prandin for mealtime control but difficult to judge efficacy because of lack of postprandial monitoring. Was given Victoza to start in 2011 but did not follow up after this.She was tried on premixed insulin but this did not help her control, mostly because of noncompliance with the doses. She had been using the V- go pump and had better control initially and was better compliant with the daily routine and boluses. She has had frequent education visits also. She stopped using her V.-go pump in 6/15 because of discomfort at the site of application Also did not improve with a trial of Victoza   RECENT history:    INSULIN dose: Basaglar 40 units once or twice a day.  NovoLog 10 units in the morning Non-insulin hypoglycemic drugs: None  Her most recent A1c in 5/18 was over 15  Current management, problems identified:   She has been given specific instructions on taking her insulin regimen but she has not followed this  She is checking her blood sugars infrequently and not clear how often  Apparently her boyfriend is giving her Basaglar after supper based on her glucose level ; this is instead of the NovoLog that was recommended  She is not taking the NovoLog BEFORE the evening meal, was also recommended taking this at lunchtime to cover her regular soft drinks  With this her blood sugar is always  high after supper, waist on what she is eating and drinking  She is concerned about weight gain with insulin  She was supposed to start Invokana on her last visit but has not started this  Blood sugar in the office today is 110 without any Novolog this morning, she thinks she just had a drink with Coke recently   Glucometer:  FreeStyle.   Blood Glucose readings by recall:     Mean values apply above for all meters except median for One Touch  PRE-MEAL Fasting Lunch Dinner Bedtime Overall  Glucose range: 150      Mean/median:        POST-MEAL PC Breakfast PC Lunch PC Dinner  Glucose range:   210-300 +   Mean/median:       Hypoglycemia frequency: Never.    Food preferences: eating Mostly 1 meals per day, variable intake, sandwiches at times or otherwise snacks, still getting regular drinks and sweet tea  Physical activity: exercise: Minimal.  She has difficulties with leg pain and weakness  Certified Diabetes Educator visit: Most recent:, 2/14.     Wt Readings from Last 3 Encounters:  08/08/17 177 lb (80.3 kg)  06/29/17 174 lb 3.2 oz (79 kg)  05/24/17 164 lb 9.6 oz (74.7 kg)   DM labs:   Lab Results  Component Value Date   HGBA1C 8.8 08/08/2017   HGBA1C >15 05/24/2017   HGBA1C 10.1 12/28/2016   Lab Results  Component Value Date   MICROALBUR 0.4 10/19/2016  [place] [address] 01/01/2016   CREATININE 1.1 05/04/2017       Other active problems: See review of systems    Allergies as of 08/08/2017      Reactions   Latex Itching, Rash, Other (See Comments)   Pt states she cannot use condoms - cause an infection.  Use of latex on skin is okay.  Tape causes rash   Sweetening Enhancer [flavoring Agent] Nausea And Vomiting, Other (See Comments)   HEADACHES   Tetracyclines & Related Nausea And Vomiting, Other (See Comments)   ;YEAST INFECTIONS   Aspartame And Phenylalanine Nausea And Vomiting   HEADACHES   Ibuprofen Other (See Comments)   HEADACHES   Trazodone And  Nefazodone Other (See Comments)   Hallucinations   Triazolam Other (See Comments)   HALLUCINATIONS      Medication List       Accurate as of 08/08/17  4:08 PM. Always use your most recent med list.          ACCU-CHEK NANO SMARTVIEW w/Device Kit Use to check blood sugars daily. Dx code E11.65   BASAGLAR KWIKPEN 100 UNIT/ML Sopn Inject 0.1 mLs (10 Units total) into the skin every morning. And pen needles 1/day   canagliflozin 300 MG Tabs tablet Commonly known as:  INVOKANA Take 1 tablet (300 mg total) by mouth daily before breakfast.   diclofenac 75 MG EC tablet Commonly known as:  VOLTAREN Take 1 tablet (75 mg total) by mouth 2 (two) times daily.   freestyle lancets Use to check blood sugars 1-3 times daily   gabapentin 300 MG capsule Commonly known as:  NEURONTIN Take 1 capsule (300 mg total) by mouth at bedtime.   glucose blood test strip Commonly known as:  ACCU-CHEK SMARTVIEW Use to check blood sugars 3 times daily, Dx Code E11.65   insulin aspart 100 UNIT/ML FlexPen Commonly known as:  NOVOLOG Take 10 units before your main meal. If blood sugars are consistently over 250 after the meal start taking 14 units before your main meal.   lidocaine 5 % Commonly known as:  LIDODERM Place 1 patch onto the skin daily. Remove & Discard patch within 12 hours or as directed by MD   oxyCODONE 5 MG immediate release tablet Commonly known as:  Oxy IR/ROXICODONE       Allergies:  Allergies  Allergen Reactions  . Latex Itching, Rash and Other (See Comments)    Pt states she cannot use condoms - cause an infection.  Use of latex on skin is okay.  Tape causes rash  . Sweetening Enhancer [Flavoring Agent] Nausea And Vomiting and Other (See Comments)    HEADACHES  . Tetracyclines & Related Nausea And Vomiting and Other (See Comments)    ;YEAST INFECTIONS  . Aspartame And Phenylalanine Nausea And Vomiting    HEADACHES  . Ibuprofen Other (See Comments)    HEADACHES  .  Trazodone And Nefazodone Other (See Comments)    Hallucinations   . Triazolam Other (See Comments)    HALLUCINATIONS    Past Medical History:  Diagnosis Date  . Anginal pain ([place])   . Anxiety   . Arthritis    "qwhere" (02/12/2014)  . Bipolar disorder ([place])   . Cancer [facility])    colon cancer  . Chest pain    a. 2008 Cath: normal cors;  b. 12/2013 Lexi CL: EF 47%, no ischemia/infarct.  . Cholecystitis   . Chronic back pain   . Chronic pain   . Claudication ([place])  a. 12/2013 ABI's: R 0.97, L 0.94.  [name] COPD (chronic obstructive pulmonary disease) ([place])   . Depression   . Diabetic foot ulcer ([facility])    chronic  . DJD (degenerative joint disease)   . Edema   . Gastroparesis   . GERD (gastroesophageal reflux disease)   . H/O hiatal hernia   . SUPJSRPR(945.8)    "weekly" (02/12/2014)  . History of blood transfusion 1986   "related to lost a child" (02/12/2014)  . Hypercholesterolemia   . Hyperlipemia   . Hypersomnia   . Hypertension   . Insomnia   . MRSA (methicillin resistant Staphylococcus aureus)   . Neurogenic bladder   . OSA (obstructive sleep apnea) 07/04/2014  . Palpitations   . Personality disorder   . Pneumonia    "twice, I think" (02/12/2014)  . Sleep apnea    didn't tolerate cpap (02/12/2014)  . SOB (shortness of breath) on exertion   . Stroke ([place]) 02/12/2014   "eyesight is messed up; not steady on my feet" (02/12/2014)  . SUI (stress urinary incontinence, female) S/P SLING 12-29-2011  . Tobacco abuse   . Type II diabetes mellitus ([place])   . Ulcer   . Vertigo     Past Surgical History:  Procedure Laterality Date  . ABDOMINAL HYSTERECTOMY    . ANTERIOR CERVICAL DECOMP/DISCECTOMY FUSION  2000   C6 - 7  . CARDIAC CATHETERIZATION  05-22-2008   DR SKAINS   NO SIGNIFECANT CAD/ NORMAL LV/  EF 65%/  NO WALL MOTION ABNORMALITIES  . CARDIAC CATHETERIZATION  02/12/2014  . CARPAL TUNNEL RELEASE Right 04-25-2013  . [place]; 1992  . CYSTO N/A 04/30/2013    Procedure: CYSTOSCOPY;  Surgeon: Reece Packer, MD;  Location: [facility];  Service: Urology;  Laterality: N/A;  . CYSTOSCOPY WITH INJECTION  05/04/2012   Procedure: CYSTOSCOPY WITH INJECTION;  Surgeon: Reece Packer, MD;  Location: [place];  Service: Urology;  Laterality: N/A;  MACROPLASTIQUE INJECTION  . CYSTOSCOPY WITH INJECTION  08/28/2012   Procedure: CYSTOSCOPY WITH INJECTION;  Surgeon: Reece Packer, MD;  Location: [facility];  Service: Urology;  Laterality: N/A;  cysto and macroplastique   . FOOT SURGERY Bilateral   . HERNIA REPAIR  ?1996   "stomach"  . KNEE ARTHROSCOPY W/ ALLOGRAFT IMPANT Left    graft x 2  . KNEE SURGERY     TOTAL 8 SURG'S  . LAPAROSCOPIC SIGMOID COLECTOMY  04/22/2015  . LAPAROSCOPIC SIGMOID COLECTOMY N/A 04/22/2015   Procedure: LAPAROSCOPIC HAND ASSISTED SIGMOID COLECTOMY;  Surgeon: Erroll Luna, MD;  Location: [place];  Service: General;  Laterality: N/A;  . LEFT HEART CATHETERIZATION WITH CORONARY ANGIOGRAM N/A 02/12/2014   Procedure: LEFT HEART CATHETERIZATION WITH CORONARY ANGIOGRAM;  Surgeon: Candee Furbish, MD;  Location: [facility];  Service: Cardiovascular;  Laterality: N/A;  . LUMBAR FUSION     "cage in my spine"  . MULTIPLE LAPAROSCOPIES FOR ENDOMETRIOSIS    . PUBOVAGINAL SLING  12/29/2011   Procedure: Gaynelle Arabian;  Surgeon: Reece Packer, MD;  Location[facility];  Service: Urology;  Laterality: N/A;  cysto and sparc sling   . PUBOVAGINAL SLING N/A 04/30/2013   Procedure: REMOVAL OF VAGINAL MESH;  Surgeon: Reece Packer, MD;  Location: [facility];  Service: Urology;  Laterality: N/A;  . RECONSTURCTION OF CONGENITAL UTERUS ANOMALY  1983  . REPEAT RECONSTRUCTION ACL LEFT KNEE/ SCREWS REMOVED  03-28-2000   CADAVER GRAFT  .  TOTAL ABDOMINAL HYSTERECTOMY W/ BILATERAL SALPINGOOPHORECTOMY  1997  . WOUND DEBRIDEMENT Right 09/05/2016   Procedure: DEBRIDEMENT WOUND WITH GRAFT RIGHT FOOT;  Surgeon:  Landis Martins, DPM;  Location: [place];  Service: Podiatry;  Laterality: Right;    Family History  Problem Relation Age of Onset  . Hypertension Mother   . Diabetes Mother   . Cancer - Other Mother        lymphoma   . Cancer - Other Father        lung, bladder cancer   . Heart attack Father   . Cancer - Other Brother        bladder cancer     Social History:  reports that she has been smoking Cigarettes and E-cigarettes.  She has a 82.00 pack-year smoking history. She has never used smokeless tobacco. She reports that she does not drink alcohol or use drugs.  Review of Systems -   DEPRESSION:  Not on treatment, Has difficulty sleeping and Has refused to see a psychiatrist  She has history of high triglycerides previously treated with fenofibrate   Again she is not taking Lipitor 40 mg previously prescribed for high LDL  Lab Results  Component Value Date   CHOL 111 12/28/2016   HDL 34.10 (L) 12/28/2016   LDLCALC 41 01/01/2016   LDLDIRECT 46.0 12/28/2016   TRIG 291.0 (H) 12/28/2016   CHOLHDL 3 12/28/2016     HYPOKALEMIA:  Her last potassium was normal, Has not had any follow-up recently   Lab Results  Component Value Date   CREATININE 1.1 05/04/2017   BUN 5.8 (L) 05/04/2017   NA 127 (L) 05/04/2017   K 4.2 05/04/2017   CL 97 12/28/2016   CO2 21 (L) 05/04/2017    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      Assesment/Plan:   Diabetes type 2, with persistently poor control, BMI 35 See history of present illness for detailed discussion of current diabetes  management, blood sugar patterns and problems identified  A1c is somewhat better than before with taking some insulin regularly at 8.8  She is insulin deficient and currently taking insulin erratically and not according to the directions given on her last visit She is currently getting Basaglar variable doses in the evenings based on her postprandial reading and discussed that this is not appropriate She does not get NovoLog to cover her evening meal  Surprisingly today her blood sugars only 110 despite drinking regular Coke. And no insulin today Blood sugar control again is difficult because of her variable eating and drinking habits with significant sugar consumption   Specific instructions for monitoring and insulin as well as Invokana were reviewed in detail with the patient and her boyfriend  She will take Basaglar once a day only  She will try to take NovoLog very consistently before her evening meal and take 15-20 units based on what she is eating  If she has high readings in the morning she can take 10 units of Novolog before  starting to drink her regular soft drinks  She needs more readings before supper  Needs to bring her monitor for download  Start Invokana daily  Again encouraged her to cut back on regular soft drinks   Previous electrolyte abnormalities: Will need follow-up labs today  Hyperlipidemia: She has refused treatment, will need follow-up labs today   Patient Instructions  CHECKING blood sugars:  Check blood sugar in the morning on waking up at least every other day  Also check blood sugars periodically at noon time and before supper  May check blood sugar about 2 hours after evening meal also  Please bring blood sugar monitor for each visit  BASAGLAR insulin: Take this only once a day now, starting with 40 units  NOVOLOG: Take 20 units before supper time for regular meals and 15 units for smaller meals  If blood sugar is over 150 in the morning  take 10 units of Novolog when drinking Coke otherwise may skip this  Please ask [facility] to [facility] the Invokana prescription, this is already at the drugstore   Counseling time on subjects discussed in assessment and plan sections is over 50% of today's 25 minute visit      [place] 08/08/2017, 4:08 PM                                                   6

## 2017-08-09 ENCOUNTER — Telehealth: Payer: Self-pay | Admitting: Sports Medicine

## 2017-08-09 LAB — FRUCTOSAMINE: FRUCTOSAMINE: 261 umol/L (ref 0–285)

## 2017-08-09 NOTE — Telephone Encounter (Signed)
Hello, this is Olivia Mackie and I'm one of the nurses with [facility]. I'm calling to make you all aware that the pt has a new wound that has popped up in her inner left ankle. I seen it yesterday when I went out. I was calling to make you all aware of that. My phone number is [phone].

## 2017-08-09 NOTE — Telephone Encounter (Signed)
Reviewed LOV notes from Dr Cannon Kettle, she is aware of new wound on Lt ankle, it was evaluated and treated at her LOV

## 2017-08-14 ENCOUNTER — Other Ambulatory Visit (INDEPENDENT_AMBULATORY_CARE_PROVIDER_SITE_OTHER): Payer: Medicare Other

## 2017-08-14 ENCOUNTER — Ambulatory Visit (INDEPENDENT_AMBULATORY_CARE_PROVIDER_SITE_OTHER): Payer: Medicare Other | Admitting: Neurology

## 2017-08-14 ENCOUNTER — Encounter: Payer: Self-pay | Admitting: Neurology

## 2017-08-14 VITALS — BP 120/78 | HR 85 | Ht 65.0 in | Wt 181.4 lb

## 2017-08-14 DIAGNOSIS — E1142 Type 2 diabetes mellitus with diabetic polyneuropathy: Secondary | ICD-10-CM

## 2017-08-14 DIAGNOSIS — R4189 Other symptoms and signs involving cognitive functions and awareness: Secondary | ICD-10-CM | POA: Diagnosis not present

## 2017-08-14 LAB — TSH: TSH: 1.8 u[IU]/mL (ref 0.35–4.50)

## 2017-08-14 LAB — VITAMIN B12: VITAMIN B 12: 171 pg/mL — AB (ref 211–911)

## 2017-08-14 MED ORDER — GABAPENTIN 600 MG PO TABS: 600.0000 mg | ORAL_TABLET | Freq: Two times a day (BID) | ORAL | 5 refills | Status: DC

## 2017-08-14 NOTE — Progress Notes (Signed)
[facility] Clinic Note - Initial Visit   Date: 08/14/17  [name] MRN: [id number] DOB: 1966/09/24   Dear . Little:  Thank you for your kind referral of [name] for consultation of neuropathy. Although her history is well known to you, please allow [place] to reiterate it for the purpose of our medical record. The patient was accompanied to the clinic by fiance who also provides collateral information.     History of Present Illness: [name] is a 51 y.o. right-handed Caucasian female with insulin-dependent diabetes mellitus (2004, HbA1c 8.8) , tobacco use, GERD, depression, COPD, and chronic pain presenting for evaluation of neuropathy.    For the past 18 years, she has had burning and stabbing pain of the toes along with numbness.  Over the years, the pain has progressed to involve her feet and lower legs.  She has tried gabapentin, Lyrica, amitriptyline, Cymbalta. She is currently taking gabapentin 378m BID, lidocaine patch, and oxyocodone 537mprescribed by her PCP, but nothing provides relief.  She is requesting stronger pain medications.  She also has chronic ulceration of the left foot, followed by podiatry.  She walks unassisted and has difficulty with gait due to Charcot foot deformity.  More recently, she has started to have burning tingling of the hands which is worse with tactile stimuli, such as hand-holding.  She also complains of memory lapses where she cannot remember parts of his history, easily forgets appointments and where she is going; frequently misplaces objects, etc. She lives alone and manages her own IADLs.  She is very concerned about her memory because she feels that she may have Alzheimer's dementia.   Out-side paper records, electronic medical record, and images have been reviewed where available and summarized as:  Lab Results  Component Value Date   HGBA1C 8.8 08/08/2017   Lab Results  Component Value Date   TSH  4.08 01/01/2016    Past Medical History:  Diagnosis Date  . Anginal pain ([facility]  . Anxiety   . Arthritis    "qwhere" (02/12/2014)  . Bipolar disorder ([facility] [facility]  . [facility] ([facility]   colon cancer  . Chest pain    a. 2008 Cath: normal cors;  b. 12/2013 [facility] CL: EF 47%, no ischemia/infarct.  . Cholecystitis   . Chronic back pain   . Chronic pain   . Claudication ([facility]   a. 12/2013 ABI's: R 0.97, L 0.94.  . Marland KitchenOPD (chronic obstructive pulmonary disease) ([facility]  . Depression   . Diabetic foot ulcer (HCC)    chronic  . DJD (degenerative joint disease)   . Edema   . Gastroparesis   . GERD (gastroesophageal reflux disease)   . H/O hiatal hernia   . HeFVCBSWHQ(759.1   "weekly" (02/12/2014)  . History of blood transfusion 1986   "related to lost a child" (02/12/2014)  . Hypercholesterolemia   . Hyperlipemia   . Hypersomnia   . Hypertension   . Insomnia   . MRSA (methicillin resistant Staphylococcus aureus)   . Neurogenic bladder   . OSA (obstructive sleep apnea) 07/04/2014  . Palpitations   . Personality disorder   . Pneumonia    "twice, I think" (02/12/2014)  . Sleep apnea    didn't tolerate cpap (02/12/2014)  . SOB (shortness of breath) on exertion   . Stroke ([facility]CR[place]2/18/2015   "eyesight is messed up; not steady on my feet" (02/12/2014)  . SUI (stress urinary incontinence, female)  S/P SLING 12-29-2011  . Tobacco abuse   . Type II diabetes mellitus ([place])   . Ulcer   . Vertigo     Past Surgical History:  Procedure Laterality Date  . ABDOMINAL HYSTERECTOMY    . ANTERIOR CERVICAL DECOMP/DISCECTOMY FUSION  2000   C6 - 7  . CARDIAC CATHETERIZATION  05-22-2008   DR SKAINS   NO SIGNIFECANT CAD/ NORMAL LV/  EF 65%/  NO WALL MOTION ABNORMALITIES  . CARDIAC CATHETERIZATION  02/12/2014  . CARPAL TUNNEL RELEASE Right 04-25-2013  . [place]; 1992  . CYSTO N/A 04/30/2013   Procedure: CYSTOSCOPY;  Surgeon: Reece Packer, MD;  Location: [facility];  Service: Urology;  Laterality:  N/A;  . CYSTOSCOPY WITH INJECTION  05/04/2012   Procedure: CYSTOSCOPY WITH INJECTION;  Surgeon: Reece Packer, MD;  Location: [place];  Service: Urology;  Laterality: N/A;  MACROPLASTIQUE INJECTION  . CYSTOSCOPY WITH INJECTION  08/28/2012   Procedure: CYSTOSCOPY WITH INJECTION;  Surgeon: Reece Packer, MD;  Location: [facility];  Service: Urology;  Laterality: N/A;  cysto and macroplastique   . FOOT SURGERY Bilateral   . HERNIA REPAIR  ?1996   "stomach"  . KNEE ARTHROSCOPY W/ ALLOGRAFT IMPANT Left    graft x 2  . KNEE SURGERY     TOTAL 8 SURG'S  . LAPAROSCOPIC SIGMOID COLECTOMY  04/22/2015  . LAPAROSCOPIC SIGMOID COLECTOMY N/A 04/22/2015   Procedure: LAPAROSCOPIC HAND ASSISTED SIGMOID COLECTOMY;  Surgeon: Erroll Luna, MD;  Location: [place];  Service: General;  Laterality: N/A;  . LEFT HEART CATHETERIZATION WITH CORONARY ANGIOGRAM N/A 02/12/2014   Procedure: LEFT HEART CATHETERIZATION WITH CORONARY ANGIOGRAM;  Surgeon: Candee Furbish, MD;  Location: [facility] C[facility] LAB;  Service: Cardiovascular;  Laterality: N/A;  . LUMBAR FUSION     "cage in my spine"  . MULTIPLE LAPAROSCOPIES FOR ENDOMETRIOSIS    . PUBOVAGINAL SLING  12/29/2011   Procedure: Gaynelle Arabian;  Surgeon: Reece Packer, MD;  [facility];  Service: Urology;  Laterality: N/A;  cysto and sparc sling   . PUBOVAGINAL SLING N/A 04/30/2013   Procedure: REMOVAL OF VAGINAL MESH;  Surgeon: Reece Packer, MD;  Location: [facility];  Service: Urology;  Laterality: N/A;  . RECONSTURCTION OF CONGENITAL UTERUS ANOMALY  1983  . REPEAT RECONSTRUCTION ACL LEFT KNEE/ SCREWS REMOVED  03-28-2000   CADAVER GRAFT  . TOTAL ABDOMINAL HYSTERECTOMY W/ BILATERAL SALPINGOOPHORECTOMY  1997  . WOUND DEBRIDEMENT Right 09/05/2016   Procedure: DEBRIDEMENT WOUND WITH GRAFT RIGHT FOOT;  Surgeon: Landis Martins, DPM;  Location: [place];  Service: Podiatry;  Laterality: Right;     Medications:    Outpatient Encounter Prescriptions as of 08/14/2017  Medication Sig  . Blood Glucose Monitoring Suppl (ACCU-CHEK NANO SMARTVIEW) w/Device KIT Use to check blood sugars daily. Dx code E11.65  . canagliflozin (INVOKANA) 300 MG TABS tablet Take 1 tablet (300 mg total) by mouth daily before breakfast.  . diclofenac (VOLTAREN) 75 MG EC tablet Take 1 tablet (75 mg total) by mouth 2 (two) times daily.  [name] glucose blood (ACCU-CHEK SMARTVIEW) test strip Use to check blood sugars 3 times daily, Dx Code E11.65  . insulin aspart (NOVOLOG) 100 UNIT/ML FlexPen Take 10 units before your main meal. If blood sugars are consistently over 250 after the meal start taking 14 units before your main meal.  . Insulin Glargine (BASAGLAR KWIKPEN) 100 UNIT/ML SOPN Inject 0.1 mLs (10 Units total) into the skin  every morning. And pen needles 1/day (Patient taking differently: Inject 40 Units into the skin 3 (three) times daily. And pen needles 1/day)  . Lancets (FREESTYLE) lancets Use to check blood sugars 1-3 times daily  . oxyCODONE (OXY IR/ROXICODONE) 5 MG immediate release tablet   . [DISCONTINUED] gabapentin (NEURONTIN) 300 MG capsule Take 1 capsule (300 mg total) by mouth at bedtime.  . gabapentin (NEURONTIN) 600 MG tablet Take 1 tablet (600 mg total) by mouth 2 (two) times daily.  [name] lidocaine (LIDODERM) 5 % Place 1 patch onto the skin daily. Remove & Discard patch within 12 hours or as directed by MD (Patient not taking: Reported on 08/14/2017)   Facility-Administered Encounter Medications as of 08/14/2017  Medication  . betamethasone acetate-betamethasone sodium phosphate (CELESTONE) injection 3 mg  . triamcinolone acetonide (KENALOG) 10 MG/ML injection 10 mg     Allergies:  Allergies  Allergen Reactions  . Latex Itching, Rash and Other (See Comments)    Pt states she cannot use condoms - cause an infection.  Use of latex on skin is okay.  Tape causes rash  . Sweetening Enhancer [Flavoring Agent] Nausea And  Vomiting and Other (See Comments)    HEADACHES  . Tetracyclines & Related Nausea And Vomiting and Other (See Comments)    ;YEAST INFECTIONS  . Aspartame And Phenylalanine Nausea And Vomiting    HEADACHES  . Ibuprofen Other (See Comments)    HEADACHES  . Trazodone And Nefazodone Other (See Comments)    Hallucinations   . Triazolam Other (See Comments)    HALLUCINATIONS    Family History: Family History  Problem Relation Age of Onset  . Hypertension Mother   . Diabetes Mother   . Cancer - Other Mother        lymphoma   . Cancer - Other Father        lung, bladder cancer   . Heart attack Father   . Cancer - Other Brother        bladder cancer     Social History: Social History  Substance Use Topics  . Smoking status: Current Every Day Smoker    Packs/day: 2.00    Years: 41.00    Types: Cigarettes, E-cigarettes  . Smokeless tobacco: Never Used  . Alcohol use No     Comment: denies   Social History   Social History Narrative   Patient is divorced and lives alone- independent in ADLs, Drives   Patient has two adult children- grown son and daughter who is special needs in a home   Patient is disabled since 26   Patient has a 6th grade education.   Patient is right-handed.   Depression-medication and therapist   Patient drinks 2- 3 liters of soda and drinks tea but not everyday.    Does not routinely exercise.      Patient reports that she drinks "a lot" of caffeine daily     Review of Systems:  CONSTITUTIONAL: No fevers, chills, night sweats, or weight loss.   EYES: No visual changes or eye pain ENT: No hearing changes.  No history of nose bleeds.   RESPIRATORY: No cough, wheezing and shortness of breath.   CARDIOVASCULAR: Negative for chest pain, and palpitations.   GI: Negative for abdominal discomfort, blood in stools or black stools.  No recent change in bowel habits.   GU:  No history of incontinence.   MUSCLOSKELETAL: +history of joint pain or swelling.   No myalgias.   SKIN: +for lesions,  rash, and itching.   HEMATOLOGY/ONCOLOGY: Negative for prolonged bleeding, bruising easily, and swollen nodes.  No history of cancer.   ENDOCRINE: Negative for cold or heat intolerance, polydipsia or goiter.   PSYCH:  +depression or anxiety symptoms.   NEURO: As Above.   Vital Signs:  BP 120/78   Pulse 85   Ht _0  (1.651 m)   Wt 181 lb 6 oz (82.3 kg)   SpO2 98%   BMI 30.18 kg/m    General Medical Exam:   General:  Well appearing, poorly dressed, comfortable.   Eyes/ENT: see cranial nerve examination.   Neck: No masses appreciated.  Full range of motion without tenderness.  No carotid bruits. Respiratory:  Clear to auscultation, good air entry bilaterally.   Cardiac:  Regular rate and rhythm, no murmur.   Extremities:  No deformities, edema, or skin discoloration.  Skin:  No rashes or lesions.  Neurological Exam: MENTAL STATUS including orientation to time, place, person, recent and remote memory, attention span and concentration, language, and fund of knowledge is normal.  Speech is not dysarthric. Montreal Cognitive Assessment  08/14/2017  Visuospatial/ Executive (0/5) 4  Naming (0/3) 3  Attention: Read list of digits (0/2) 2  Attention: Read list of letters (0/1) 1  Attention: Serial 7 subtraction starting at 100 (0/3) 1  Language: Repeat phrase (0/2) 2  Language : Fluency (0/1) 0  Abstraction (0/2) 2  Delayed Recall (0/5) 2  Orientation (0/6) 6  Total 23  Adjusted Score (based on education) 24    CRANIAL NERVES: II:  No visual field defects.  Unremarkable fundi.   III-IV-VI: Pupils equal round and reactive to light.  Normal conjugate, extra-ocular eye movements in all directions of gaze.  No nystagmus.  No ptosis.   V:  Normal facial sensation.     VII:  Normal facial symmetry and movements.  No pathologic facial reflexes.  VIII:  Normal hearing and vestibular function.   IX-X:  Normal palatal movement.   XI:  Normal shoulder  shrug and head rotation.   XII:  Normal tongue strength and range of motion, no deviation or fasciculation.  MOTOR:  R >> L charcot joint deformity. Left foot was wrapped in dressing due to foot ulceration.  No fasciculations or abnormal movements.  No pronator drift.  Tone is normal.    Right Upper Extremity:    Left Upper Extremity:    Deltoid  5/5   Deltoid  5/5   Biceps  5/5   Biceps  5/5   Triceps  5/5   Triceps  5/5   Wrist extensors  5/5   Wrist extensors  5/5   Wrist flexors  5/5   Wrist flexors  5/5   Finger extensors  5/5   Finger extensors  5/5   Finger flexors  5/5   Finger flexors  5/5   Dorsal interossei  4+/5   Dorsal interossei  4+/5   Abductor pollicis  5/5   Abductor pollicis  5/5   Tone (Ashworth scale)  0  Tone (Ashworth scale)  0   Right Lower Extremity:    Left Lower Extremity:    Hip flexors  5/5   Hip flexors  5/5   Hip extensors  5/5   Hip extensors  5/5   Knee flexors  5/5   Knee flexors  5/5   Knee extensors  5/5   Knee extensors  5/5   Dorsiflexors  4/5   Dorsiflexors  4/5  Plantarflexors  4/5   Plantarflexors  4/5   Toe extensors  4/5   Toe extensors  4/5   Toe flexors  4/5   Toe flexors  4/5   Tone (Ashworth scale)  0  Tone (Ashworth scale)  0   MSRs:  Right                                                                 Left brachioradialis 2+  brachioradialis 2+  biceps 2+  biceps 2+  triceps 2+  triceps 2+  patellar 1+  patellar 1+  ankle jerk 0  ankle jerk 0  Hoffman no  Hoffman no  plantar response down  plantar response n/a   SENSORY:  Reduced temperature and pin prick distal to mid-calf, vibration is absent below the ankles.    COORDINATION/GAIT: Normal finger-to- nose-finger.  Intact rapid alternating movements bilaterally.  Gait wide-based and antalgic.     IMPRESSION: 1.  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 She has been on a number of medications for pain in the past (gabapentin, Lyrica, amitriptyline,  Cymbalta) and is also on oxycodone prescribed by her PCP.  She was recently started on gapapentin 365m BID which will be titrated to 6052mBID, but I explained that given the number of medications she has been on in the past, the likelihood of effective pain relief will be difficult.  Patient was informed that our office is a non-opioid prescribing practice.  She may benefit from seeing Pain Management going forward.  I also stressed the importance of tight diabetes management to prevent progression of the disease.  2.  Subjective cognitive impairment.  She scored very well on her cognitive screening exam (26/30) and I reassured her that she did not have dementia syndrome, and her memory changes may be stemming from mood disorder as well as medication.  She is requesting neurocognitive testing.  Check TSH and vitamin B12.  Return to clinic in 6 months.   The duration of this appointment visit was 45 minutes of face-to-face time with the patient.  Greater than 50% of this time was spent in counseling, explanation of diagnosis, planning of further management, and coordination of care.   Thank you for allowing me to participate in patient's care.  If I can answer any additional questions, I would be pleased to do so.    Sincerely,    Sherlyn Ebbert K. PaPosey ProntoDO

## 2017-08-14 NOTE — Patient Instructions (Addendum)
1.  Start gabapentin as follow:    Morning       Afternoon        Evening  Week 1 300mg                                  600mg               Continue 600mg                  600mg              2.  Check labs  3.  You have been referred for a neurocognitive evaluation in our office.   The evaluation consists of three appointments.   1. The first appointment is about 45 minutes and is a clinical interview with the neuropsychologist (Dr. Macarthur Critchley). You can bring someone with you to this appointment, as it is helpful for Dr. Si Raider to hear from both you and another adult who knows you well.   2. The second appointment is 2-3 hours long and is with the psychometrician Milana Kidney). You will complete a variety of tasks- mostly question-and-answer, some paper-and-pencil, some on the computer. There is nothing you need to do to prepare for this appointment, but having a good night's sleep prior to the testing, and bringing eyeglasses and hearing aids (if you wear them), is advised.   3. The final appointment is a follow-up with Dr. Si Raider where she will go over the test results with you and provide recommendations. This appointment is about 30 minutes.  If you would like a family member to receive this information as well, please bring them to the appointment.   We have to reserve several hours of the neuropsychologist's time and the psychometrician's time for your appointment. As such, please note that there is a No-Show fee of $100. If you are unable to attend any of your appointments, please contact our office as soon as possible to reschedule.   Return to clinic in 6 months

## 2017-08-15 ENCOUNTER — Telehealth: Payer: Self-pay | Admitting: *Deleted

## 2017-08-15 ENCOUNTER — Telehealth: Payer: Self-pay | Admitting: Sports Medicine

## 2017-08-15 NOTE — Telephone Encounter (Signed)
Called patient and left message for her to call me back.

## 2017-08-15 NOTE — Telephone Encounter (Signed)
Left message informing pt I was calling to discuss the graft Dr. Cannon Kettle wanted for her.

## 2017-08-15 NOTE — Telephone Encounter (Signed)
-----   Message from Alda Berthold, DO sent at 08/15/2017  9:33 AM EDT ----- Please inform patient that her vitamin B12 is very low and she needs injections.  Start vitamin B12 1031mcg IM injection daily x 7 days, weekly x 4 weeks, then monthly thereafter x 1 year.  She can get them with PCP, if that is easier.

## 2017-08-15 NOTE — Telephone Encounter (Signed)
This is [name] with [facility]. I'm calling to let you know that she refused her visit today. Stated she is not available and that she is out of town all this week. We are required to advise of missed visits. If you have any questions please call me back at [phone].

## 2017-08-16 NOTE — Telephone Encounter (Addendum)
-----   Message from Landis Martins, [place] sent at 08/15/2017  2:31 PM EDT ----- Regarding: MRI Results MRI results are negative for any bone infection. There is some arthritis and the ulceration defect. But nothing else worrisome -Dr. Cannon Kettle.08/16/2017-I informed pt of Dr. Leeanne Rio review of MRI results. Pt states she will contact [facility] 08/21/2017 to discuss graft payment.

## 2017-08-16 NOTE — Telephone Encounter (Signed)
-----   Message from Landis Martins, [place] sent at 08/15/2017  2:31 PM EDT ----- Regarding: MRI Results MRI results are negative for any bone infection. There is some arthritis and the ulceration defect. But nothing else worrisome -Dr. Cannon Kettle

## 2017-08-21 NOTE — Telephone Encounter (Addendum)
Pt presents to schedule for grafts. I apologized to pt, because the insurance representative [employer] was not in, but I would have her call pt tomorrow to discuss payment plan with pt. Pt states understanding.08/22/2017-V. Hill - Scientist, clinical (histocompatibility and immunogenetics) states have pt call her once pt has received a bill for the graft, and she will help pt with a payment plan.0/8/29/2018-I informed pt of V. Hill's statement and pt wanted to know why she was being billed for the graft, when they put one on her right foot in the hospital and she wasn't billed. I told her I would have to let V. Hill discuss that with her. Transferred pt to [employer]. Hill. Pt states she has been transferred back to me, and she wants to know if she would have to pay if the graft was put on in the hospital, the other graft was put on in the hospital and she didn't have to pay.08/24/2017-I informed pt of Dr. Leeanne Rio statement and pt states she thinks she would like to have it performed in the hospital so she would not have the $1000.00 charge.

## 2017-08-22 ENCOUNTER — Telehealth: Payer: Self-pay | Admitting: *Deleted

## 2017-08-22 ENCOUNTER — Encounter: Payer: Self-pay | Admitting: *Deleted

## 2017-08-22 NOTE — Telephone Encounter (Signed)
-----   Message from Alda Berthold, DO sent at 08/15/2017  9:33 AM EDT ----- Please inform patient that her vitamin B12 is very low and she needs injections.  Start vitamin B12 1068mcg IM injection daily x 7 days, weekly x 4 weeks, then monthly thereafter x 1 year.  She can get them with PCP, if that is easier.

## 2017-08-22 NOTE — Telephone Encounter (Signed)
Patient given results and instructions and will begin the injections today or tomorrow in our office.

## 2017-08-22 NOTE — Telephone Encounter (Signed)
"  This message is for [name].  You [place] beach [place].  I told you I would call and schedule the surgery as soon as possible.  I got a calendar now.  I'm looking at a calendar, you all need to give me a call.  Please do not call my cell phone.   I do not  have but a few minutes and it's for emergencies only."

## 2017-08-23 NOTE — Telephone Encounter (Signed)
In office cost is different from hospital cost. When she comes in for her next appointment we can further discuss and if patient prefers placement of graft in hospital then we can arrange surgery for graft placement at [facility] -Dr. Cannon Kettle

## 2017-08-24 NOTE — Telephone Encounter (Signed)
Ok thanks. We will plan for OR placement of graft when she comes to next office visit -Dr. Chauncey Cruel

## 2017-08-29 ENCOUNTER — Encounter: Payer: Self-pay | Admitting: Sports Medicine

## 2017-08-29 ENCOUNTER — Ambulatory Visit (INDEPENDENT_AMBULATORY_CARE_PROVIDER_SITE_OTHER): Payer: Medicare Other | Admitting: Sports Medicine

## 2017-08-29 DIAGNOSIS — L97521 Non-pressure chronic ulcer of other part of left foot limited to breakdown of skin: Secondary | ICD-10-CM | POA: Diagnosis not present

## 2017-08-29 DIAGNOSIS — M79672 Pain in left foot: Secondary | ICD-10-CM

## 2017-08-29 DIAGNOSIS — E1142 Type 2 diabetes mellitus with diabetic polyneuropathy: Secondary | ICD-10-CM

## 2017-08-29 DIAGNOSIS — E11610 Type 2 diabetes mellitus with diabetic neuropathic arthropathy: Secondary | ICD-10-CM

## 2017-08-29 MED ORDER — TRAMADOL HCL 50 MG PO TABS: 50.0000 mg | ORAL_TABLET | Freq: Four times a day (QID) | ORAL | 0 refills | Status: AC | PRN

## 2017-08-29 NOTE — Progress Notes (Signed)
Subjective: [name] is a 51 y.o. female patient seen today in office for follow up left foot pain and ulceration on left currently using iodosorb dressing to area daily at ulcer site with assistance of home nursing and boyfriend. Reports that home nursing is not doing a good job. Reports so drainage and smell. Reports that she went to the beach and didn't change her dressing much. Patient also went to the neurologist but reports that it was a "waste of time", states that they could not help her. Denies calf pain, denies headache, chest pain, shortness of breath, nausea, vomiting, fever, or chills. No other issues noted.   Patient Active Problem List   Diagnosis Date Noted  . Cancer of sigmoid colon ([place]) 03/09/2015  . OSA (obstructive sleep apnea) 07/04/2014  . Migraine without aura, with intractable migraine, so stated, without mention of status migrainosus 03/26/2014  . Peripheral neuropathy 03/03/2014  . Abnormal stress test 02/12/2014  . CVA (cerebral infarction) 02/12/2014  . Chest pain   . Abnormal heart rhythm   . Edema   . Hypertension   . Hyperlipemia   . Hypercholesterolemia   . Other and unspecified hyperlipidemia 11/14/2013  . Type II diabetes mellitus, uncontrolled ([place]) 07/22/2013  . Essential hypertension 07/22/2013  . 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 Diabetic polyneuropathy ([place]) 07/22/2013    Current Outpatient Prescriptions on File Prior to Visit  Medication Sig Dispense Refill  . Blood Glucose Monitoring Suppl (ACCU-CHEK NANO SMARTVIEW) w/Device KIT Use to check blood sugars daily. Dx code E11.65 1 kit 2  . canagliflozin (INVOKANA) 300 MG TABS tablet Take 1 tablet (300 mg total) by mouth daily before breakfast. 30 tablet 3  . diclofenac (VOLTAREN) 75 MG EC tablet Take 1 tablet (75 mg total) by mouth 2 (two) times daily. 30 tablet 1  . gabapentin (NEURONTIN) 600 MG tablet Take 1  tablet (600 mg total) by mouth 2 (two) times daily. 60 tablet 5  . glucose blood (ACCU-CHEK SMARTVIEW) test strip Use to check blood sugars 3 times daily, Dx Code E11.65 100 each 3  . insulin aspart (NOVOLOG) 100 UNIT/ML FlexPen Take 10 units before your main meal. If blood sugars are consistently over 250 after the meal start taking 14 units before your main meal. 45 mL 2  . Insulin Glargine (BASAGLAR KWIKPEN) 100 UNIT/ML SOPN Inject 0.1 mLs (10 Units total) into the skin every morning. And pen needles 1/day (Patient taking differently: Inject 40 Units into the skin 3 (three) times daily. And pen needles 1/day) 15 pen 2  . Lancets (FREESTYLE) lancets Use to check blood sugars 1-3 times daily 100 each 12  . lidocaine (LIDODERM) 5 % Place 1 patch onto the skin daily. Remove & Discard patch within 12 hours or as directed by MD 30 patch 0  . oxyCODONE (OXY IR/ROXICODONE) 5 MG immediate release tablet      Current Facility-Administered Medications on File Prior to Visit  Medication Dose Route Frequency Provider Last Rate Last Dose  . betamethasone acetate-betamethasone sodium phosphate (CELESTONE) injection 3 mg  3 mg Intramuscular Once Daylene Katayama M, DPM      . triamcinolone acetonide (KENALOG) 10 MG/ML injection 10 mg  10 mg Other Once Landis Martins, DPM        Allergies  Allergen Reactions  . Latex Itching, Rash and Other (See Comments)    Pt states she cannot use condoms - cause an infection.  Use of latex on skin is okay.  Tape causes rash  . Sweetening Enhancer [Flavoring Agent] Nausea And Vomiting and Other (See Comments)    HEADACHES  . Tetracyclines & Related Nausea And Vomiting and Other (See Comments)    ;YEAST INFECTIONS  . Aspartame And Phenylalanine Nausea And Vomiting    HEADACHES  . Ibuprofen Other (See Comments)    HEADACHES  . Trazodone And Nefazodone Other (See Comments)    Hallucinations   . Triazolam Other (See Comments)    HALLUCINATIONS    Objective: There were  no vitals filed for this visit.  General: No acute distress, AAOx3  Right foot:  At right medial midfoot ulceration is well healed with no surrounding infection or re-opening, mild swelling right foot, no streaking erythema/cellulitis, no warmth, no drainage, no gross signs of infection noted, Capillary fill time <3 seconds in all digits, gross sensation present via light touch to right foot. Protective sensation absent. 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 No pain or crepitation with range of motion right foot.  No pain with calf compression.   To left foot there is partial thickness ulceration with granular base that measures 2x1x0.2cm larger in size sub met 1 with no active drainage, unchanged with mild maceration, no odor, no other signs of infection. Subjective occasional pain to left foot pain. No other issues.  Assessment and Plan:  Problem List Items Addressed This Visit      Endocrine   Diabetic polyneuropathy (HCC)    Other Visit Diagnoses    Foot ulceration, left, limited to breakdown of skin (HCC)    -  Primary   Diabetic Charcot's foot (HCC)       Relevant Medications   traMADol (ULTRAM) 50 MG tablet   Left foot pain         -Patient seen and evaluated -MRI negative  -Debrided ulceration at left sub met 1 using sterile tissue nipper to the level of the dermis in an excisional fashion to healthy bleeding. Post debridement measurements as above. Applied Epifix 24mm disk, graft #1 to site, re-order # [id number], exp 05-26-2022 secured with steri-strips covered with adaptic and dry dressing. Advised patient to change outer dressing with help of boyfriend or nursing 1-2x per week. -Neurology recommendations reviewed; Rx Tramadol as needed for pain. I advised patient that I can not give anything for more pain like other controlled NARCOTICS -Advised patient to continue with custom diabetic shoes with offloading -Advised patient to limit activity to  necessity  -Advised patient to elevate as necessary and to take it easy while the graft incorporates this week  -Return 2 weeks for follow up eval of left foot ulcer possible re-graft.  In the meantime, patient to call office if any issues or problems arise.   , DPM 

## 2017-08-30 NOTE — Telephone Encounter (Addendum)
-----   Message from [place], [place] sent at 08/29/2017  9:37 PM EDT ----- Regarding: Wound care orders  Change outer dressing layers, Patient has graft on left foot; please do not disturb steri-strip layer; replace dry guaze, cover with 4x4, kerlix, and coban 1-2x per week until patient is seen again in office. -Dr. Cannon Kettle. 08/30/2017-Faxed Dr. Leeanne Rio orders of09/03/2017 9:37pm to [facility]. 08/31/2017-Emailed order for EpiFix 71mm disc to be sent to [facility] requested arrival 16/09/9603 Monday for application [id number] order to [facility] for 86mm EpiFix to be delivered prior to 78/46/9629 application.

## 2017-09-04 ENCOUNTER — Telehealth: Payer: Self-pay | Admitting: Sports Medicine

## 2017-09-04 NOTE — Telephone Encounter (Signed)
Ok thanks 

## 2017-09-04 NOTE — Telephone Encounter (Signed)
This is [name] with [employer] calling. Pt had some kind of graft applied last week and we went to change out the dressing. I know the instructions stated not to disturb the steri strip but they came off during the dressing change. I wanted to call and let you know. You can reach me at [phone]. Thank you.

## 2017-09-06 NOTE — Progress Notes (Signed)
[place]  Telephone:([phone]) [phone] Fax:(336) [phone]  Clinic follow up Note   Patient Care Team: Tamsen Roers, MD as PCP - General (Family Medicine) Elayne Snare, MD as Consulting Physician (Endocrinology) Erroll Luna, MD as Consulting Physician (General Surgery) Truitt Merle, MD as Consulting Physician (Hematology) Arta Silence, MD as Consulting Physician (Gastroenterology) 09/08/2017   CHIEF COMPLAINTS:  Follow up stage I colon cancer   Oncology History   Colon cancer   Staging form: Colon and Rectum, AJCC 7th Edition     Pathologic stage from 05/16/2015: Stage I (T1, N0, cM0) - Signed by Truitt Merle, MD on 06/08/2015       Cancer of sigmoid colon ([place])   02/26/2015 Initial Diagnosis    Sigmoid Colon cancer      02/26/2015 Procedure    Colonoscopy showed internal hemorrhoids, 2 polyps in the transverse and ileocecal valve. There is a ulcerated polypoid lesion in the sigmoid colon which was biopsied.      03/02/2015 Tumor Marker    CEA= 2.5      03/06/2015 Imaging    CT chest, abdomen and pelvis with contrast showed no evidence of metastatic disease.      04/22/2015 Surgery    Sigmoid colon segmental resection, showed grade 2 adenocarcinoma, T1, 1.3 cm, 10 lymph nodes were negative, no lymphovascular invasion, no perineural invasion.      HISTORY OF PRESENTING ILLNESS:  [name] 51 y.o. female with the multiple medical comorbidities, including diabetes, hypertension, depression and stroke in 2015, is here because of recently diagnosed colon cancer.  She has chronic constipation, no nausea, abdominal pain or diarrhea. She denies melena or hematochezia. She was referred to see GI Dr. Paulita Fujita, and underwent a colonoscopy on 02/26/2015. The colonoscopy showed internal hemorrhoids, 2 polyps in the transverse and ileocecal valve. A ulcerated polypoid lesion in the sigmoid colon was seen and biopsied, which showed adenocarcinoma. She was referred to  surgeon  Dr. Brantley Stage, and underwent laparoscopic sigmoid colon segmental resection on 04/22/2015.  She has recovered well from surgery. She was discharged home after surgery. She had ED visit on 05/04/2015 for wound dehiscence after staple removal. She has been changing the dressing every other day at home. She has appointment to see Dr. Brantley Stage this abdomen.  She denies any significant pain, BM is normal. No other new complains. She has diabetic neuropathy in hands and feet, which is stable. She lives alone, does not have much social support. She appears to be depressed with a very flat facial expression, but she denies suicidal ideas.  CURRENT THERAPY: Surveillance  INTERIM HISTORY  [name] returns for follow-up as scheduled. She presents to the clinic today accompanied by her fianc, they are getting married in October this year. [name] has not felt well since her last visit on 05/05/2017. She has noticed a "toilet full" of rectal bleeding and blood mixed with stool on 3 episodes over 3 months. She denies nausea, abdominal or rectal pain, no pain with bowel movements. No history of hemorrhoids. She has chronic constipation which resolves on its own, last BM on 09/07/2017.   The patient has long-standing history of trouble sleeping, she has tried many medications without good effect. This leads to significant daytime fatigue. She is asking for a prescription of Vistaril which she received in the hospital and it helped.  MEDICAL HISTORY:  Past Medical History:  Diagnosis Date   Anginal pain ([place])    Anxiety    Arthritis    "  qwhere" (02/12/2014)   Bipolar disorder ([place])    Cancer ([place])    colon cancer   Chest pain    a. 2008 Cath: normal cors;  b. 12/2013 [facility] CL: EF 47%, no ischemia/infarct.   Cholecystitis    Chronic back pain    Chronic pain    Claudication ([place])    a. 12/2013 ABI's: R 0.97, L 0.94.   COPD (chronic obstructive pulmonary disease) ([facility])    Depression    Diabetic  foot ulcer ([facility])    chronic   DJD (degenerative joint disease)    Edema    Gastroparesis    GERD (gastroesophageal reflux disease)    H/O hiatal hernia    Headache(784.0)    "weekly" (02/12/2014)   History of blood transfusion 1986   "related to lost a child" (02/12/2014)   Hypercholesterolemia    Hyperlipemia    Hypersomnia    Hypertension    Insomnia    MRSA (methicillin resistant Staphylococcus aureus)    Neurogenic bladder    OSA (obstructive sleep apnea) 07/04/2014   Palpitations    Personality disorder    Pneumonia    "twice, I think" (02/12/2014)   Sleep apnea    didn't tolerate cpap (02/12/2014)   SOB (shortness of breath) on exertion    Stroke ([place]) 02/12/2014   "eyesight is messed up; not steady on my feet" (02/12/2014)   SUI (stress urinary incontinence, female) S/P SLING 12-29-2011   Tobacco abuse    Type II diabetes mellitus ([place])    Ulcer    Vertigo    SURGICAL HISTORY: Past Surgical History:  Procedure Laterality Date   ABDOMINAL HYSTERECTOMY     ANTERIOR CERVICAL DECOMP/DISCECTOMY FUSION  2000   C6 - 7   CARDIAC CATHETERIZATION  05-22-2008   DR SKAINS   NO SIGNIFECANT CAD/ NORMAL LV/  EF 65%/  NO WALL MOTION ABNORMALITIES   CARDIAC CATHETERIZATION  02/12/2014   CARPAL TUNNEL RELEASE Right 04-25-2013   CESAREAN SECTION  1989; 1992   CYSTO N/A 04/30/2013   Procedure: CYSTOSCOPY;  Surgeon: Reece Packer, MD;  Location: [facility];  Service: Urology;  Laterality: N/A;   CYSTOSCOPY WITH INJECTION  05/04/2012   Procedure: CYSTOSCOPY WITH INJECTION;  Surgeon: Reece Packer, MD;  Location: [facility] [place];  Service: Urology;  Laterality: N/A;  MACROPLASTIQUE INJECTION   CYSTOSCOPY WITH INJECTION  08/28/2012   Procedure: CYSTOSCOPY WITH INJECTION;  Surgeon: Reece Packer, MD;  Location: [place];  Service: Urology;  Laterality: N/A;  cysto and macroplastique    FOOT SURGERY Bilateral    HERNIA  REPAIR  ?1996   "stomach"   KNEE ARTHROSCOPY W/ ALLOGRAFT IMPANT Left    graft x 2   KNEE SURGERY     TOTAL 8 SURG'S   LAPAROSCOPIC SIGMOID COLECTOMY  04/22/2015   LAPAROSCOPIC SIGMOID COLECTOMY N/A 04/22/2015   Procedure: LAPAROSCOPIC HAND ASSISTED SIGMOID COLECTOMY;  Surgeon: Erroll Luna, MD;  Location: [facility];  Service: General;  Laterality: N/A;   LEFT HEART CATHETERIZATION WITH CORONARY ANGIOGRAM N/A 02/12/2014   Procedure: LEFT HEART CATHETERIZATION WITH CORONARY ANGIOGRAM;  Surgeon: Candee Furbish, MD;  Location: [facility] CATH [facility];  Service: Cardiovascular;  Laterality: N/A;   LUMBAR FUSION     "cage in my spine"   MULTIPLE LAPAROSCOPIES FOR ENDOMETRIOSIS     PUBOVAGINAL SLING  12/29/2011   Procedure: Gaynelle Arabian;  Surgeon: Reece Packer, MD;  Location[facility];  Service: Urology;  Laterality:  N/A;  cysto and sparc sling    PUBOVAGINAL SLING N/A 04/30/2013   Procedure: REMOVAL OF VAGINAL MESH;  Surgeon: Reece Packer, MD;  Location: [facility];  Service: Urology;  Laterality: N/A;   RECONSTURCTION OF CONGENITAL UTERUS ANOMALY  1983   REPEAT RECONSTRUCTION ACL LEFT KNEE/ SCREWS REMOVED  03-28-2000   CADAVER GRAFT   TOTAL ABDOMINAL HYSTERECTOMY W/ BILATERAL SALPINGOOPHORECTOMY  1997   WOUND DEBRIDEMENT Right 09/05/2016   Procedure: DEBRIDEMENT WOUND WITH GRAFT RIGHT FOOT;  Surgeon: Landis Martins, DPM;  Location: [place];  Service: Podiatry;  Laterality: Right;   SOCIAL HISTORY: Social History   Social History   Marital status: Divorced    Spouse name: N/A   Number of children: 2   Years of education: 6th   Occupational History   Not on file.   Social History Main Topics   Smoking status: Current Every Day Smoker    Packs/day: 2.00    Years: 41.00    Types: Cigarettes, E-cigarettes   Smokeless tobacco: Never Used   Alcohol use No     Comment: denies   Drug use: No   Sexual activity: Yes   Other Topics Concern   Not on file     Social History Narrative   Patient is divorced and lives alone- independent in [place], Drives   Patient has two adult children- grown son and daughter who is special needs in a home   Patient is disabled since 69   Patient has a 6th grade education.   Patient is right-handed.   Depression-medication and therapist   Patient drinks 2- 3 liters of soda and drinks tea but not everyday.    Does not routinely exercise.      Patient reports that she drinks "a lot" of caffeine daily   She and her fiance are getting married 10/21/2017.   FAMILY HISTORY: Family History  Problem Relation Age of Onset   Hypertension Mother    Diabetes Mother    Cancer - Other Mother        lymphoma    Cancer - Other Father        lung, bladder cancer    Heart attack Father    Cancer - Other Brother        bladder cancer    ALLERGIES:  is allergic to latex; sweetening enhancer [flavoring agent]; tetracyclines & related; aspartame and phenylalanine; ibuprofen; trazodone and nefazodone; and triazolam.  MEDICATIONS:  Current Outpatient Prescriptions  Medication Sig Dispense Refill   Blood Glucose Monitoring Suppl (ACCU-CHEK NANO SMARTVIEW) w/Device KIT Use to check blood sugars daily. Dx code E11.65 1 kit 2   canagliflozin (INVOKANA) 300 MG TABS tablet Take 1 tablet (300 mg total) by mouth daily before breakfast. 30 tablet 3   diclofenac (VOLTAREN) 75 MG EC tablet Take 1 tablet (75 mg total) by mouth 2 (two) times daily. 30 tablet 1   gabapentin (NEURONTIN) 300 MG capsule      gabapentin (NEURONTIN) 600 MG tablet Take 1 tablet (600 mg total) by mouth 2 (two) times daily. 60 tablet 5   glucose blood (ACCU-CHEK SMARTVIEW) test strip Use to check blood sugars 3 times daily, Dx Code E11.65 100 each 3   insulin aspart (NOVOLOG) 100 UNIT/ML FlexPen Take 10 units before your main meal. If blood sugars are consistently over 250 after the meal start taking 14 units before your main meal. 45 mL 2    Insulin Glargine (BASAGLAR KWIKPEN) 100 UNIT/ML SOPN Inject 0.1 mLs (  10 Units total) into the skin every morning. And pen needles 1/day (Patient taking differently: Inject 40 Units into the skin 3 (three) times daily. And pen needles 1/day) 15 pen 2   Lancets (FREESTYLE) lancets Use to check blood sugars 1-3 times daily 100 each 12   lidocaine (LIDODERM) 5 % Place 1 patch onto the skin daily. Remove & Discard patch within 12 hours or as directed by MD 30 patch 0   oxyCODONE (OXY IR/ROXICODONE) 5 MG immediate release tablet      tamsulosin (FLOMAX) 0.4 MG CAPS capsule      zolpidem (AMBIEN) 10 MG tablet Take 1 tablet (10 mg total) by mouth at bedtime as needed for sleep. 30 tablet 0   Current Facility-Administered Medications  Medication Dose Route Frequency Provider Last Rate Last Dose   betamethasone acetate-betamethasone sodium phosphate (CELESTONE) injection 3 mg  3 mg Intramuscular Once Daylene Katayama M, DPM       triamcinolone acetonide (KENALOG) 10 MG/ML injection 10 mg  10 mg Other Once Landis Martins, DPM       REVIEW OF SYSTEMS:   Constitutional: Denies fevers, chills or decreased appetite. (+) night sweats (+) significant fatigue Eyes: Denies blurriness of vision, double vision or watery eyes Ears, nose, mouth, throat, and face: Denies mucositis or sore throat  Respiratory: Denies wheezes (+) cough (+) dyspnea (+) smoking 3 packs per day Cardiovascular: Denies palpitation (+) chest discomfort when coughing (+) left lower extremity swelling and leg pain, history of painful neuropathy Gastrointestinal:  Denies nausea, vomiting, diarrhea, heartburn or change in bowel habits (+) constipation, resolves spontaneously (+) rectal bleeding and blood in stool 3 over 3 months Skin: Denies abnormal skin rashes  Lymphatics: Denies new lymphadenopathy or easy bruising Neurological: Denies tingling or new weaknesses (+) chronic pain and numbness to feet bilaterally,  unchanged Behavioral/Psych: Mood is stable, no new changes (+) insomnia All other systems were reviewed with the patient and are negative.  PHYSICAL EXAMINATION:  ECOG PERFORMANCE STATUS: 1 - Symptomatic but completely ambulatory  Vitals:   09/08/17 0943  BP: 134/69  Pulse: 87  Resp: 17  Temp: 98.6 F (37 C)  SpO2: 99%   Filed Weights   09/08/17 0943  Weight: 179 lb 14.4 oz (81.6 kg)   GENERAL:alert, no distress and comfortable. Flat affect.  SKIN: skin color, texture, turgor are normal, no rashes or significant lesions. Slow healing diabetic wounds to feet  EYES: normal, conjunctiva are pink and non-injected, sclera clear OROPHARYNX:no exudate, no erythema and lips, buccal mucosa, and tongue normal  NECK: supple, thyroid normal size, non-tender, without nodularity LYMPH:  no palpable cervical, supraclavicular, or axillary lymphadenopathy  LUNGS: clear to auscultation bilaterally with normal breathing effort HEART: regular rate & rhythm, S1 and S2 present, no murmurs. Trace left ankle and lower extremity edema ABDOMEN:soft, round, nontender and normal bowel sounds. No palpable hepatomegaly or masses.  Musculoskeletal:no cyanosis of digits and no clubbing  PSYCH: alert & oriented x 3 with fluent speech NEURO: no focal motor/sensory deficits Rectal: External exam revealed no abnormal findings. Digital rectal exam: Possible internal hemorrhoids, no masses, no blood.  LABORATORY DATA:  I have reviewed the data as listed CBC Latest Ref Rng & Units 09/08/2017 05/04/2017 12/02/2016  WBC 3.9 - 10.3 10e3/uL 15.4(H) 12.6(H) 20.0(H)  Hemoglobin 11.6 - 15.9 g/dL 12.7 13.8 13.1  Hematocrit 34.8 - 46.6 % 37.6 40.8 37.6  Platelets 145 - 400 10e3/uL 267 233 223   CMP Latest Ref Rng & Units 09/08/2017  08/08/2017 05/04/2017  Glucose 70 - 140 mg/dl 282(H) 96 614(HH)  BUN 7.0 - 26.0 mg/dL 6.3(L) 7 5.8(L)  Creatinine 0.6 - 1.1 mg/dL 0.7 0.52 1.1  Sodium 136 - 145 mEq/L 137 141 127(L)  Potassium  3.5 - 5.1 mEq/L 3.8 4.0 4.2  Chloride 96 - 112 mEq/L - 108 -  CO2 22 - 29 mEq/L 23 30 21(L)  Calcium 8.4 - 10.4 mg/dL 9.3 9.2 9.1  Total Protein 6.4 - 8.3 g/dL 6.6 - 6.7  Total Bilirubin 0.20 - 1.20 mg/dL <0.22 - 0.24  Alkaline Phos 40 - 150 U/L 92 - 123  AST 5 - 34 U/L 8 - 8  ALT 0 - 55 U/L 6 - 13   Results for LASHICA, HANNAY (MRN [id number]) as of 05/05/2017 15:38  Ref. Range 12/02/2016 14:32 12/02/2016 14:32 05/04/2017 14:18 09/08/2017  09:00   CEA Latest Ref Range: 0.0 - 4.7 ng/mL  2.6 7.5 (H)   CEA ([facility]) Latest Ref Range: 0.00 - 5.00 ng/mL 2.80  8.56 (H) 4.19    PATHOLOGY REPORT Diagnosis 04/22/2015 1. Colon, segmental resection for tumor, Sigmoid - COLONIC ADENOCARCINOMA EXTENDING INTO SUBMUCOSA, 1.3 CM - MARGINS NOT INVOLVED. - NINE BENIGN LYMPH NODES (0/9). 2. Colon, segmental resection, Sigmoid - FOCAL MESENTERIC FAT NECROSIS WITH HEMORRHAGE AND FIBROSIS. - NO EVIDENCE OF MALIGNANCY. - ONE BENIGN LYMPH NODE (0/1). 3. Colon, resection margin (donut), Proximal donut - BENIGN COLON TISSUE. - NO EVIDENCE OF MALIGNANCY. 4. Colon, resection margin (donut), Distal donut - BENIGN COLON TISSUE. - NO EVIDENCE OF MALIGNANCY.  Microscopic Comment 1. COLON AND RECTUM (INCLUDING TRANS-ANAL RESECTION): Specimen: Sigmoid colon with proximal and distal donut margins. Procedure: Segmental resection with proximal and distal donut margins. Tumor site: Sigmoid Specimen integrity: Previously opened Macroscopic intactness of mesorectum: Incomplete Macroscopic tumor perforation: No Invasive tumor: Maximum size: 1.3 cm Histologic type(s): Colonic adenocarcinoma Histologic grade and differentiation: G2: moderately differentiated/low grade Type of polyp in which invasive carcinoma arose: No residual polyp Microscopic extension of invasive tumor: Into submucosa Lymph-Vascular invasion: N Peri-neural invasion: No Tumor deposit(s) (discontinuous extramural extension): No Resection  margins: Proximal margin: Free of tumor Distal margin: Free of tumor Circumferential (radial) (posterior ascending, posterior descending; lateral and posterior mid-rectum; and entire lower 1/3 rectum): N/A Mesenteric margin (sigmoid and transverse): Free of tumor Distance closest margin (if all above margins negative): N/A Treatment effect (neo-adjuvant therapy): No Additional polyp(s): No Non-neoplastic findings: Focal mesenteric adipose tissue fat necrosis with hemorrhage and fibrosis. Lymph nodes: number examined 10; number positive: 0 Pathologic Staging: pT1, pN0, pMX Ancillary studies: Pending. (JDP:kh 4 -28-16)  RADIOGRAPHIC STUDIES: I have personally reviewed the radiological images as listed and agreed with the findings in the report.  CT CAP w contrast 05/04/2017 IMPRESSION: 1. No acute findings within the chest, abdomen and pelvis. No evidence for residual/recurrent tumor or metastatic disease. 2. Aortic Atherosclerosis (ICD10-I70.0) and Emphysema (ICD10-J43.9).  CT CAP w contrast  05/06/2016 IMPRESSION: 1. Stable exam. No evidence for metastatic disease in the chest, abdomen, or pelvis. 2. Suture line in the mid sigmoid colon, compatible with partial colectomy. 3. Centrilobular emphysema. 4. Steatosis.  ASSESSMENT & PLAN: 51 y.o.  Caucasian female, with multiple medical comorbidities, who was found to have a stage I sigmoid colon cancer.  1. Stage I sigmoid colon adenocarcinoma, pT1N0M0, moderately differentiated, MMR normal -Her colon cancer was likely cured by complete surgical resection, there is no role of adjuvant chemotherapy for stage I colon cancer -Her tumor is MMR normal, she  does not have significant family history of colon or endometrial cancer, unlikely Lynch syndrome. -She has had 2 annual CT scans which were negative for recurrence. Clinically early stage of colon cancer, I do not think routine surveillance CT scan is necessary. -She cannot remember the  date of her last colonoscopy and f/u with Dr. Paulita Fujita. -In light of new rectal bleeding and blood in stool, we again reviewed that although her early stage colon cancer was likely cured with complete resection, she does carry a small risk of recurrence.  -We cannot rule out recurrence without further testing, we reviewed her CT in 04/2017 was negative but this may not pick up early stage recurrence.  -Digital rectal exam negative for masses. I recommend colonoscopy and follow up with Dr. Paulita Fujita. She is refusing colonoscopy at this time but will consider.  -I reviewed her CEA, which is normal today at 4.19. Last CEA was elevated at 8.56, likely due to smoking -F/u with lab and physical exam in 6 months  2. Depression and coping -She appears to be depressed, has very flat facial expression, she states this is her normal status and she denies suicidal ideas.  3. Poorly controlled diabetes -BG 282 today, does not check at home -Historically patient is noncompliant and takes insulin only when her fiance gives it to her; [place] and Nov[employer]  -Followed by endocrinologist Dr. Dwyane Dee, last visit 08/08/17  4.  hypertension, history of stroke, COPD -f/u with PCP   5. Smoking cessation -She is currently smoking 3 PPD -She has tried Chantix without success, can't afford nicotine products -I encouraged her to cut down and quit, gave her resources for support classes and community resources that can provide free nicotine replacement products   6. Insomnia -Trouble getting to sleep and staying asleep -She has tried multiple agents and strategies to promote sleep without success -She is taking once daily oxycodone and gabapentin BID, we discussed this combination usually induces sleep but for her it does not -I gave prescription for 1 month supply of ambien, if this helps her she will need further prescriptions from her PCP  -I cautioned her about her medication regimen and instructed her to avoid alcohol  altogether while taking these meds.  Plan -Prescription for ambien -I recommend colonoscopy and f/u with Dr. Paulita Fujita due to her recent rectal bleeidng; I will copy this note to Dr. Paulita Fujita -lab and f/u in 6 months -Quit smoking -Continue endocrinology and PCP f/u  All questions were answered. The patient knows to call the clinic with any problems, questions or concerns.  I spent 20 minutes counseling the patient face to face. The total time spent in the appointment was 30 minutes and more than 50% was on counseling.  Cira Rue, AGNP-C 09/08/2017 10:46 AM  I have seen the patient, examined her. I agree with the assessment and and plan and have edited the notes.   Truitt Merle  09/08/2017

## 2017-09-08 ENCOUNTER — Encounter: Payer: Self-pay | Admitting: Hematology

## 2017-09-08 ENCOUNTER — Ambulatory Visit (HOSPITAL_BASED_OUTPATIENT_CLINIC_OR_DEPARTMENT_OTHER): Payer: Medicare Other | Admitting: Hematology

## 2017-09-08 ENCOUNTER — Other Ambulatory Visit (HOSPITAL_BASED_OUTPATIENT_CLINIC_OR_DEPARTMENT_OTHER): Payer: Medicare Other

## 2017-09-08 VITALS — BP 134/69 | HR 87 | Temp 98.6°F | Resp 17 | Ht 65.0 in | Wt 179.9 lb

## 2017-09-08 DIAGNOSIS — C187 Malignant neoplasm of sigmoid colon: Secondary | ICD-10-CM

## 2017-09-08 DIAGNOSIS — E1165 Type 2 diabetes mellitus with hyperglycemia: Secondary | ICD-10-CM

## 2017-09-08 DIAGNOSIS — Z8673 Personal history of transient ischemic attack (TIA), and cerebral infarction without residual deficits: Secondary | ICD-10-CM | POA: Diagnosis not present

## 2017-09-08 DIAGNOSIS — I10 Essential (primary) hypertension: Secondary | ICD-10-CM

## 2017-09-08 DIAGNOSIS — G4700 Insomnia, unspecified: Secondary | ICD-10-CM | POA: Diagnosis not present

## 2017-09-08 DIAGNOSIS — F17200 Nicotine dependence, unspecified, uncomplicated: Secondary | ICD-10-CM

## 2017-09-08 DIAGNOSIS — F329 Major depressive disorder, single episode, unspecified: Secondary | ICD-10-CM | POA: Diagnosis not present

## 2017-09-08 DIAGNOSIS — J449 Chronic obstructive pulmonary disease, unspecified: Secondary | ICD-10-CM

## 2017-09-08 LAB — COMPREHENSIVE METABOLIC PANEL
ALBUMIN: 3.1 g/dL — AB (ref 3.5–5.0)
ALK PHOS: 92 U/L (ref 40–150)
ALT: 6 U/L (ref 0–55)
AST: 8 U/L (ref 5–34)
Anion Gap: 9 mEq/L (ref 3–11)
BUN: 6.3 mg/dL — AB (ref 7.0–26.0)
CO2: 23 mEq/L (ref 22–29)
CREATININE: 0.7 mg/dL (ref 0.6–1.1)
Calcium: 9.3 mg/dL (ref 8.4–10.4)
Chloride: 104 mEq/L (ref 98–109)
EGFR: >90 mL/min/{1.73_m2} (ref >90)
GLUCOSE: 282 mg/dL — AB (ref 70–140)
Potassium: 3.8 mEq/L (ref 3.5–5.1)
SODIUM: 137 meq/L (ref 136–145)
TOTAL PROTEIN: 6.6 g/dL (ref 6.4–8.3)

## 2017-09-08 LAB — CBC WITH DIFFERENTIAL/PLATELET
BASO%: 0.9 % (ref 0.0–2.0)
Basophils Absolute: 0.1 10*3/uL (ref 0.0–0.1)
EOS ABS: 0.2 10*3/uL (ref 0.0–0.5)
EOS%: 1.4 % (ref 0.0–7.0)
HCT: 37.6 % (ref 34.8–46.6)
HEMOGLOBIN: 12.7 g/dL (ref 11.6–15.9)
LYMPH#: 4.7 10*3/uL — AB (ref 0.9–3.3)
LYMPH%: 30.1 % (ref 14.0–49.7)
MCH: 29.8 pg (ref 25.1–34.0)
MCHC: 33.8 g/dL (ref 31.5–36.0)
MCV: 88.3 fL (ref 79.5–101.0)
MONO#: 0.6 10*3/uL (ref 0.1–0.9)
MONO%: 3.6 % (ref 0.0–14.0)
NEUT%: 64 % (ref 38.4–76.8)
NEUTROS ABS: 9.9 10*3/uL — AB (ref 1.5–6.5)
Platelets: 267 10*3/uL (ref 145–400)
RBC: 4.26 10*6/uL (ref 3.70–5.45)
RDW: 13.6 % (ref 11.2–14.5)
WBC: 15.4 10*3/uL — AB (ref 3.9–10.3)

## 2017-09-08 LAB — CEA (IN HOUSE-CHCC): CEA (CHCC-In House): 4.19 ng/mL (ref 0.00–5.00)

## 2017-09-08 MED ORDER — ZOLPIDEM TARTRATE 10 MG PO TABS: 10.0000 mg | ORAL_TABLET | Freq: Every evening | ORAL | 0 refills | Status: DC | PRN

## 2017-09-09 ENCOUNTER — Other Ambulatory Visit: Payer: Self-pay

## 2017-09-09 ENCOUNTER — Emergency Department (HOSPITAL_COMMUNITY): Payer: Medicare Other

## 2017-09-09 ENCOUNTER — Emergency Department (HOSPITAL_COMMUNITY)
Admission: EM | Admit: 2017-09-09 | Discharge: 2017-09-09 | Disposition: A | Discharge status: Home / Self Care | Payer: Medicare Other | Attending: Emergency Medicine | Admitting: Emergency Medicine

## 2017-09-09 ENCOUNTER — Encounter (HOSPITAL_COMMUNITY): Payer: Self-pay

## 2017-09-09 DIAGNOSIS — Z8673 Personal history of transient ischemic attack (TIA), and cerebral infarction without residual deficits: Secondary | ICD-10-CM | POA: Diagnosis not present

## 2017-09-09 DIAGNOSIS — E119 Type 2 diabetes mellitus without complications: Secondary | ICD-10-CM | POA: Insufficient documentation

## 2017-09-09 DIAGNOSIS — Y998 Other external cause status: Secondary | ICD-10-CM | POA: Insufficient documentation

## 2017-09-09 DIAGNOSIS — I10 Essential (primary) hypertension: Secondary | ICD-10-CM | POA: Insufficient documentation

## 2017-09-09 DIAGNOSIS — S0990XA Unspecified injury of head, initial encounter: Secondary | ICD-10-CM | POA: Diagnosis not present

## 2017-09-09 DIAGNOSIS — S1093XA Contusion of unspecified part of neck, initial encounter: Secondary | ICD-10-CM | POA: Diagnosis not present

## 2017-09-09 DIAGNOSIS — Z794 Long term (current) use of insulin: Secondary | ICD-10-CM | POA: Insufficient documentation

## 2017-09-09 DIAGNOSIS — Y939 Activity, unspecified: Secondary | ICD-10-CM | POA: Insufficient documentation

## 2017-09-09 DIAGNOSIS — E7800 Pure hypercholesterolemia, unspecified: Secondary | ICD-10-CM | POA: Diagnosis not present

## 2017-09-09 DIAGNOSIS — J449 Chronic obstructive pulmonary disease, unspecified: Secondary | ICD-10-CM | POA: Insufficient documentation

## 2017-09-09 DIAGNOSIS — Z91040 Latex allergy status: Secondary | ICD-10-CM | POA: Insufficient documentation

## 2017-09-09 DIAGNOSIS — Z79899 Other long term (current) drug therapy: Secondary | ICD-10-CM | POA: Diagnosis not present

## 2017-09-09 DIAGNOSIS — W010XXA Fall on same level from slipping, tripping and stumbling without subsequent striking against object, initial encounter: Secondary | ICD-10-CM | POA: Diagnosis not present

## 2017-09-09 DIAGNOSIS — W19XXXA Unspecified fall, initial encounter: Secondary | ICD-10-CM

## 2017-09-09 DIAGNOSIS — Y92002 Bathroom of unspecified non-institutional (private) residence single-family (private) house as the place of occurrence of the external cause: Secondary | ICD-10-CM | POA: Diagnosis not present

## 2017-09-09 DIAGNOSIS — S80812A Abrasion, left lower leg, initial encounter: Secondary | ICD-10-CM | POA: Diagnosis not present

## 2017-09-09 DIAGNOSIS — F17210 Nicotine dependence, cigarettes, uncomplicated: Secondary | ICD-10-CM | POA: Insufficient documentation

## 2017-09-09 MED ORDER — HYDROMORPHONE HCL 1 MG/ML IJ SOLN
1.0000 mg | Freq: Once | INTRAMUSCULAR | Status: AC
  Administered 2017-09-09: 1 mg via INTRAMUSCULAR
  Filled 2017-09-09: qty 1

## 2017-09-09 NOTE — ED Notes (Addendum)
Patient transported to X-ray 

## 2017-09-09 NOTE — ED Notes (Signed)
ED Provider at bedside. 

## 2017-09-09 NOTE — ED Provider Notes (Signed)
TIME SEEN: 5:21 AM  CHIEF COMPLAINT: fall  HPI: Pt is a 51 y.o. female with history of chronic pain on Percocet at home, COPD, diabetes with diabetic neuropathy and Charcot feet who presents emergency department with mechanical fall. She reports she was at home in her bathroom when she tripped and lost her balance because of her neuropathy and struck her head on the tub in her bathroom. Complaining of posterior headache and posterior neck pain. Also having left posterior rib pain and anterior sternal pain. No shortness of breath. Has a large abrasion to the left posterior thigh and left calf. Reports her tetanus vaccination is up-to-date. No loss of consciousness. He is not on antiplatelets or anticoagulants. Denies numbness or focal weakness. No abdominal pain.  ROS: See HPI Constitutional: no fever  Eyes: no drainage  ENT: no runny nose   Cardiovascular:  no chest pain  Resp: no SOB  GI: no vomiting GU: no dysuria Integumentary: no rash  Allergy: no hives  Musculoskeletal: no leg swelling  Neurological: no slurred speech ROS otherwise negative  PAST MEDICAL HISTORY/PAST SURGICAL HISTORY:  Past Medical History:  Diagnosis Date  . Anginal pain ([place])   . Anxiety   . Arthritis    "qwhere" (02/12/2014)  . Bipolar disorder ([place])   . Cancer [facility])    colon cancer  . Chest pain    a. 2008 Cath: normal cors;  b. 12/2013 [facility] CL: EF 47%, no ischemia/infarct.  . Cholecystitis   . Chronic back pain   . Chronic pain   . Claudication ([place])    a. 12/2013 ABI's: R 0.97, L 0.94.  [name] COPD (chronic obstructive pulmonary disease) ([place])   . Depression   . Diabetic foot ulcer (HCC)    chronic  . DJD (degenerative joint disease)   . Edema   . Gastroparesis   . GERD (gastroesophageal reflux disease)   . H/O hiatal hernia   . DGUYQIHK(742.5)    "weekly" (02/12/2014)  . History of blood transfusion 1986   "related to lost a child" (02/12/2014)  . Hypercholesterolemia   . Hyperlipemia   .  Hypersomnia   . Hypertension   . Insomnia   . MRSA (methicillin resistant Staphylococcus aureus)   . Neurogenic bladder   . OSA (obstructive sleep apnea) 07/04/2014  . Palpitations   . Personality disorder   . Pneumonia    "twice, I think" (02/12/2014)  . Sleep apnea    didn't tolerate cpap (02/12/2014)  . SOB (shortness of breath) on exertion   . Stroke ([place]) 02/12/2014   "eyesight is messed up; not steady on my feet" (02/12/2014)  . SUI (stress urinary incontinence, female) S/P SLING 12-29-2011  . Tobacco abuse   . Type II diabetes mellitus ([place])   . Ulcer   . Vertigo     MEDICATIONS:  Prior to Admission medications   Medication Sig Start Date End Date Taking? Authorizing Provider  Blood Glucose Monitoring Suppl (ACCU-CHEK NANO SMARTVIEW) w/Device KIT Use to check blood sugars daily. Dx code E11.65 06/29/17   Elayne Snare, MD  canagliflozin [facility]) 300 MG TABS tablet Take 1 tablet (300 mg total) by mouth daily before breakfast. 06/29/17   Elayne Snare, MD  diclofenac (VOLTAREN) 75 MG EC tablet Take 1 tablet (75 mg total) by mouth 2 (two) times daily. 04/18/17   Landis Martins, DPM  gabapentin (NEURONTIN) 300 MG capsule  08/10/17   [provider]  gabapentin (NEURONTIN) 600 MG tablet Take 1 tablet (600 mg total)  by mouth 2 (two) times daily. 08/14/17   Narda Amber K, DO  glucose blood (ACCU-CHEK SMARTVIEW) test strip Use to check blood sugars 3 times daily, Dx Code E11.65 06/29/17   Elayne Snare, MD  insulin aspart (NOVOLOG) 100 UNIT/ML FlexPen Take 10 units before your main meal. If blood sugars are consistently over 250 after the meal start taking 14 units before your main meal. 06/23/17   Elayne Snare, MD  Insulin Glargine (BASAGLAR KWIKPEN) 100 UNIT/ML SOPN Inject 0.1 mLs (10 Units total) into the skin every morning. And pen needles 1/day Patient taking differently: Inject 40 Units into the skin 3 (three) times daily. And pen needles 1/day 06/23/17   Elayne Snare, MD  Lancets  (FREESTYLE) lancets Use to check blood sugars 1-3 times daily 05/24/17   Elayne Snare, MD  lidocaine (LIDODERM) 5 % Place 1 patch onto the skin daily. Remove & Discard patch within 12 hours or as directed by MD 06/06/17   Landis Martins, DPM  oxyCODONE (OXY IR/ROXICODONE) 5 MG immediate release tablet  02/18/17   [provider]  tamsulosin (FLOMAX) 0.4 MG CAPS capsule  08/31/17   [provider]  zolpidem (AMBIEN) 10 MG tablet Take 1 tablet (10 mg total) by mouth at bedtime as needed for sleep. 09/08/17 10/08/17  Alla Feeling, NP    ALLERGIES:  Allergies  Allergen Reactions  . Latex Itching, Rash and Other (See Comments)    Pt states she cannot use condoms - cause an infection.  Use of latex on skin is okay.  Tape causes rash  . Sweetening Enhancer [Flavoring Agent] Nausea And Vomiting and Other (See Comments)    HEADACHES  . Tetracyclines & Related Nausea And Vomiting and Other (See Comments)    ;YEAST INFECTIONS  . Aspartame And Phenylalanine Nausea And Vomiting    HEADACHES  . Ibuprofen Other (See Comments)    HEADACHES  . Trazodone And Nefazodone Other (See Comments)    Hallucinations   . Triazolam Other (See Comments)    HALLUCINATIONS    SOCIAL HISTORY:  Social History  Substance Use Topics  . Smoking status: Current Every Day Smoker    Packs/day: 2.00    Years: 41.00    Types: Cigarettes, E-cigarettes  . Smokeless tobacco: Never Used  . Alcohol use No     Comment: denies    FAMILY HISTORY: Family History  Problem Relation Age of Onset  . Hypertension Mother   . Diabetes Mother   . Cancer - Other Mother        lymphoma   . Cancer - Other Father        lung, bladder cancer   . Heart attack Father   . Cancer - Other Brother        bladder cancer     EXAM: BP (!) 142/75 (BP Location: Right Arm)   Pulse 77   Temp (!) 97.5 F (36.4 C) (Oral)   Resp (!) 22   SpO2 98%  CONSTITUTIONAL: Alert and oriented and responds appropriately to  questions. Appears uncomfortable; well-nourished; GCS 15 HEAD: Normocephalic; tender to the posterior scalp but I do not appreciate any significant hematoma EYES: Conjunctivae clear, PERRL, EOMI ENT: normal nose; no rhinorrhea; moist mucous membranes; pharynx without lesions noted; no dental injury; no septal hematoma NECK: Supple, no meningismus, no LAD; no midline spinal step-off or deformity; trachea midline, patient does have some tenderness over the upper cervical spine CARD: RRR; S1 and S2 appreciated; no murmurs, no clicks,  no rubs, no gallops RESP: Normal chest excursion without splinting or tachypnea; breath sounds clear and equal bilaterally; no wheezes, no rhonchi, no rales; no hypoxia or respiratory distress CHEST:  chest wall stable, no crepitus or ecchymosis or deformity, tender over the left posterior ribs and over the sternum; no flail chest ABD/GI: Normal bowel sounds; non-distended; soft, non-tender, no rebound, no guarding; no ecchymosis or other lesions noted PELVIS:  stable, nontender to palpation BACK:  The back appears normal and is non-tender to palpation, there is no CVA tenderness; no midline spinal tenderness, step-off or deformity EXT: Normal ROM in all joints; non-tender to palpation; no edema; normal capillary refill; no cyanosis, no bony tenderness or bony deformity of patient's extremities, no joint effusion, compartments are soft, extremities are warm and well-perfused, no ecchymosis SKIN: Normal color for age and race; warm, abrasions but no open laceration to the posterior left leg NEURO: Moves all extremities equally, normal sensation diffusely, cranial nerves II through XII intact, normal speech PSYCH: The patient's mood and manner are appropriate. Grooming and personal hygiene are appropriate.  MEDICAL DECISION MAKING: Patient here with mechanical fall. Will obtain CT of her head, cervical spine, x-ray of her sternum and left posterior ribs. We will give her  Dilaudid for pain control. EKG shows no ischemic abnormality. She feels her chest pain is from her fall. It is reproducible with palpation. Doubt ACS, PE or dissection.  ED PROGRESS: Patient's imaging shows no acute abnormality. Have given her second dose of Dilaudid and she feels her pain is well controlled. She has Percocet at home. I feel she is safe for discharge. Discussed head injury return precautions. Recommended rest, elevation and ice. Patient and husband comfortable with this plan.   At this time, I do not feel there is any life-threatening condition present. I have reviewed and discussed all results (EKG, imaging, lab, urine as appropriate) and exam findings with patient/family. I have reviewed nursing notes and appropriate previous records.  I feel the patient is safe to be discharged home without further emergent workup and can continue workup as an outpatient as needed. Discussed usual and customary return precautions. Patient/family verbalize understanding and are comfortable with this plan.  Outpatient follow-up has been provided if needed. All questions have been answered.      EKG Interpretation  Date/Time:  Saturday September 09 2017 05:26:33 EDT Ventricular Rate:  74 PR Interval:    QRS Duration: 92 QT Interval:  408 QTC Calculation: 453 R Axis:   82 Text Interpretation:  Sinus rhythm Rate slower than previous EKG Confirmed by Kellyn Mccary, Cyril Mourning [phone]) on 09/09/2017 5:43:26 AM         Bassheva Flury, Delice Bison, DO 09/09/17 [id number]

## 2017-09-09 NOTE — ED Triage Notes (Signed)
Pt fell tonight while in bathroom, striking back of her head. Presents to ED with c/o left neck pain, left lower back/rib pain, and left leg pain. VSS. Here with family. Denies LOC.

## 2017-09-09 NOTE — ED Notes (Signed)
Patient transported to CT 

## 2017-09-09 NOTE — ED Notes (Signed)
Pt returned from xray

## 2017-09-11 ENCOUNTER — Telehealth: Payer: Self-pay | Admitting: Hematology

## 2017-09-11 NOTE — Telephone Encounter (Signed)
Scheduled appt per 9/14 los - sent reminder letter in the mail. - lab and f/u in 6  Months.

## 2017-09-12 ENCOUNTER — Encounter: Payer: Self-pay | Admitting: Sports Medicine

## 2017-09-12 ENCOUNTER — Ambulatory Visit (INDEPENDENT_AMBULATORY_CARE_PROVIDER_SITE_OTHER): Payer: Medicare Other | Admitting: Sports Medicine

## 2017-09-12 ENCOUNTER — Telehealth: Payer: Self-pay | Admitting: *Deleted

## 2017-09-12 DIAGNOSIS — E11610 Type 2 diabetes mellitus with diabetic neuropathic arthropathy: Secondary | ICD-10-CM | POA: Diagnosis not present

## 2017-09-12 DIAGNOSIS — L97521 Non-pressure chronic ulcer of other part of left foot limited to breakdown of skin: Secondary | ICD-10-CM

## 2017-09-12 DIAGNOSIS — E1142 Type 2 diabetes mellitus with diabetic polyneuropathy: Secondary | ICD-10-CM

## 2017-09-12 MED ORDER — LIDOCAINE 5 % EX PTCH: 1.0000 | MEDICATED_PATCH | CUTANEOUS | 0 refills | Status: DC

## 2017-09-12 NOTE — Telephone Encounter (Addendum)
-----   Message from Landis Martins, [place] sent at 09/12/2017 11:12 AM EDT ----- Regarding: Reorder for next visit Epifix. Emailed order for 35mm EpiFix.

## 2017-09-12 NOTE — Progress Notes (Signed)
Subjective: [name] is a 51 y.o. female patient seen today in office for follow up left foot pain and ulceration on left; EPIFIX Graft #1 placed 2 weeks ago. Denies calf pain, denies headache, chest pain, shortness of breath, nausea, vomiting, fever, or chills. Admits that she bumped her head over the weekend and went to ED. No other issues noted.   Patient Active Problem List   Diagnosis Date Noted  . Cancer of sigmoid colon ([place]) 03/09/2015  . OSA (obstructive sleep apnea) 07/04/2014  . Migraine without aura, with intractable migraine, so stated, without mention of status migrainosus 03/26/2014  . Peripheral neuropathy 03/03/2014  . Abnormal stress test 02/12/2014  . CVA (cerebral infarction) 02/12/2014  . Chest pain   . Abnormal heart rhythm   . Edema   . Hypertension   . Hyperlipemia   . Hypercholesterolemia   . Other and unspecified hyperlipidemia 11/14/2013  . Type II diabetes mellitus, uncontrolled ([place]) 07/22/2013  . Essential hypertension 07/22/2013  . 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 Diabetic polyneuropathy ([place]) 07/22/2013    Current Outpatient Prescriptions on File Prior to Visit  Medication Sig Dispense Refill  . canagliflozin (INVOKANA) 300 MG TABS tablet Take 1 tablet (300 mg total) by mouth daily before breakfast. 30 tablet 3  . gabapentin (NEURONTIN) 600 MG tablet Take 1 tablet (600 mg total) by mouth 2 (two) times daily. 60 tablet 5  . insulin aspart (NOVOLOG) 100 UNIT/ML FlexPen Take 10 units before your main meal. If blood sugars are consistently over 250 after the meal start taking 14 units before your main meal. (Patient taking differently: Inject 20 Units into the skin every morning. ) 45 mL 2  . Insulin Glargine (BASAGLAR KWIKPEN) 100 UNIT/ML SOPN Inject 0.1 mLs (10 Units total) into the skin every morning. And pen needles 1/day (Patient taking differently: Inject 40 Units into  the skin at bedtime. ) 15 pen 2  . oxyCODONE (OXY IR/ROXICODONE) 5 MG immediate release tablet Take 5 mg by mouth daily.     . tamsulosin (FLOMAX) 0.4 MG CAPS capsule Take 0.4 mg by mouth daily.     [name] zolpidem (AMBIEN) 10 MG tablet Take 1 tablet (10 mg total) by mouth at bedtime as needed for sleep. 30 tablet 0   Current Facility-Administered Medications on File Prior to Visit  Medication Dose Route Frequency Provider Last Rate Last Dose  . betamethasone acetate-betamethasone sodium phosphate (CELESTONE) injection 3 mg  3 mg Intramuscular Once Daylene Katayama M, DPM      . triamcinolone acetonide (KENALOG) 10 MG/ML injection 10 mg  10 mg Other Once Landis Martins, DPM        Allergies  Allergen Reactions  . Latex Itching, Rash and Other (See Comments)    Pt states she cannot use condoms - cause an infection.  Use of latex on skin is okay.  Tape causes rash  . Sweetening Enhancer [Flavoring Agent] Nausea And Vomiting and Other (See Comments)    HEADACHES  . Tetracyclines & Related Nausea And Vomiting and Other (See Comments)    ;YEAST INFECTIONS  . Aspartame And Phenylalanine Nausea And Vomiting    HEADACHES  . Ibuprofen Other (See Comments)    HEADACHES  . Trazodone And Nefazodone Other (See Comments)    Hallucinations   . Triazolam Other (See Comments)    HALLUCINATIONS    Objective: There were no vitals filed for this visit.  General: No  acute distress, AAOx3  Right foot:  At right medial midfoot ulceration is well healed with no surrounding infection or re-opening, mild swelling right foot, no streaking erythema/cellulitis, no warmth, no drainage, no gross signs of infection noted, Capillary fill time <3 seconds in all digits, gross sensation present via light touch to right foot. Protective sensation absent. 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 No pain or crepitation with range of motion right foot.  No pain with calf compression.   To left  foot there is partial thickness ulceration with granular base that measures 1x0.6x0.2cm smaller in size sub met 1 with no active drainage, decreased maceration, no odor, no other signs of infection. Subjective occasional pain to left foot pain. No other issues.  Assessment and Plan:  Problem List Items Addressed This Visit      Endocrine   Diabetic polyneuropathy ([place])   Relevant Medications   lidocaine (LIDODERM) 5 %    Other Visit Diagnoses    Foot ulceration, left, limited to breakdown of skin ([place])    -  Primary   Relevant Medications   lidocaine (LIDODERM) 5 %   Diabetic Charcot's foot (HCC)       Relevant Medications   lidocaine (LIDODERM) 5 %     -Patient seen and evaluated -Debrided ulceration at left sub met 1 using sterile tissue nipper to the level of the dermis in an excisional fashion to healthy bleeding. Post debridement measurements as above. Applied Epifix 53m disk, graft #2 to site, re-order # [id number]  Tissue id # [id number] secured with steri-strips covered with adaptic and dry dressing. Advised patient to change outer dressing with help of boyfriend or nursing 1-2x per week. -Rx Lidocaine patch as needed for pain. I advised patient that I can not give anything for more pain like other controlled NARCOTICS -Advised patient to continue with custom diabetic shoes with offloading -Advised patient to limit activity to necessity  -Advised again patient to elevate as necessary and to take it easy while the graft incorporates this week  -Return 2 weeks for follow up eval of left foot ulcer possible re-graft.  In the meantime, patient to call office if any issues or problems arise.  TLandis Martins DPM

## 2017-09-13 ENCOUNTER — Telehealth: Payer: Self-pay | Admitting: Sports Medicine

## 2017-09-13 NOTE — Telephone Encounter (Signed)
I informed [name] - [facility], Dr. Cannon Kettle ordered outer dressing changes to the foot with graft 1-2 time week.

## 2017-09-13 NOTE — Telephone Encounter (Signed)
This is Marlowe Kays with [facility]. I'm at the pt's home and wanted clarification on if any orders where changed from yesterday when she was seen by Dr. Cannon Kettle. Please call me back at [phone].

## 2017-09-15 ENCOUNTER — Telehealth: Payer: Self-pay | Admitting: Endocrinology

## 2017-09-15 ENCOUNTER — Other Ambulatory Visit: Payer: Self-pay

## 2017-09-15 MED ORDER — GLUCOSE BLOOD VI STRP: ORAL_STRIP | 3 refills | Status: DC

## 2017-09-15 NOTE — Telephone Encounter (Signed)
MEDICATION: FREESTYLE TEST STRIPS  PHARMACY:   [id number] [address], [place] [address]. [phone] (Phone) [phone] (Fax)   IS THIS A 90 DAY SUPPLY : yes  IS PATIENT OUT OF MEDICATION: no  IF NOT; HOW MUCH IS LEFT: 5

## 2017-09-15 NOTE — Telephone Encounter (Signed)
Called patient and left a voice message to let her know that I have sent in the prescription for her test strips to the [facility] on [address] for her.

## 2017-09-22 ENCOUNTER — Other Ambulatory Visit: Payer: Self-pay | Admitting: Neurosurgery

## 2017-09-22 DIAGNOSIS — M5412 Radiculopathy, cervical region: Secondary | ICD-10-CM

## 2017-09-26 ENCOUNTER — Telehealth: Payer: Self-pay | Admitting: *Deleted

## 2017-09-26 ENCOUNTER — Ambulatory Visit (INDEPENDENT_AMBULATORY_CARE_PROVIDER_SITE_OTHER): Payer: Medicare Other | Admitting: Sports Medicine

## 2017-09-26 ENCOUNTER — Encounter: Payer: Self-pay | Admitting: Sports Medicine

## 2017-09-26 VITALS — BP 104/72 | HR 97 | Temp 98.3°F | Resp 16

## 2017-09-26 DIAGNOSIS — E11610 Type 2 diabetes mellitus with diabetic neuropathic arthropathy: Secondary | ICD-10-CM

## 2017-09-26 DIAGNOSIS — L97521 Non-pressure chronic ulcer of other part of left foot limited to breakdown of skin: Secondary | ICD-10-CM

## 2017-09-26 DIAGNOSIS — M79672 Pain in left foot: Secondary | ICD-10-CM

## 2017-09-26 DIAGNOSIS — E1142 Type 2 diabetes mellitus with diabetic polyneuropathy: Secondary | ICD-10-CM

## 2017-09-26 NOTE — Telephone Encounter (Addendum)
-----   Message from Landis Martins, [place] sent at 09/26/2017  1:47 PM EDT ----- Regarding: Reorder Epifix  Graft #4 for next appt Thanks Dr. Candyce Churn 4TH EPIFIX FROM [facility] FOR 10/10/2017 AT 1:45PM.

## 2017-09-26 NOTE — Progress Notes (Signed)
  Subjective: [name] is a 51 y.o. female patient seen today in office for follow up left foot pain and ulceration on left; EPIFIX Graft #2 placed 2 weeks ago. Denies calf pain, denies headache, chest pain, shortness of breath, nausea, vomiting, fever, or chills. Admits that nursing has been coming with no issues. Denies constitutional symptoms. Foot pain baseline 5/10.   FBS elevated   Patient Active Problem List   Diagnosis Date Noted  . Cancer of sigmoid colon (HCC) 03/09/2015  . OSA (obstructive sleep apnea) 07/04/2014  . Migraine without aura, with intractable migraine, so stated, without mention of status migrainosus 03/26/2014  . Peripheral neuropathy 03/03/2014  . Abnormal stress test 02/12/2014  . CVA (cerebral infarction) 02/12/2014  . Chest pain   . Abnormal heart rhythm   . Edema   . Hypertension   . Hyperlipemia   . Hypercholesterolemia   . Other and unspecified hyperlipidemia 11/14/2013  . Type II diabetes mellitus, uncontrolled (HCC) 07/22/2013  . Essential hypertension 07/22/2013  . 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 Diabetic polyneuropathy (HCC) 07/22/2013    Current Outpatient Prescriptions on File Prior to Visit  Medication Sig Dispense Refill  . canagliflozin (INVOKANA) 300 MG TABS tablet Take 1 tablet (300 mg total) by mouth daily before breakfast. 30 tablet 3  . gabapentin (NEURONTIN) 600 MG tablet Take 1 tablet (600 mg total) by mouth 2 (two) times daily. 60 tablet 5  . glucose blood (FREESTYLE LITE) test strip Use to check blood sugars up to 4 times daily. Dx Code E11.65 100 each 3  . insulin aspart (NOVOLOG) 100 UNIT/ML FlexPen Take 10 units before your main meal. If blood sugars are consistently over 250 after the meal start taking 14 units before your main meal. (Patient taking differently: Inject 20 Units into the skin every morning. ) 45 mL 2  . Insulin Glargine (BASAGLAR  KWIKPEN) 100 UNIT/ML SOPN Inject 0.1 mLs (10 Units total) into the skin every morning. And pen needles 1/day (Patient taking differently: Inject 40 Units into the skin at bedtime. ) 15 pen 2  . lidocaine (LIDODERM) 5 % Place 1 patch onto the skin daily. Remove & Discard patch within 12 hours or as directed by MD 30 patch 0  . oxyCODONE (OXY IR/ROXICODONE) 5 MG immediate release tablet Take 5 mg by mouth daily.     . tamsulosin (FLOMAX) 0.4 MG CAPS capsule Take 0.4 mg by mouth daily.     . zolpidem (AMBIEN) 10 MG tablet Take 1 tablet (10 mg total) by mouth at bedtime as needed for sleep. 30 tablet 0   Current Facility-Administered Medications on File Prior to Visit  Medication Dose Route Frequency Provider Last Rate Last Dose  . betamethasone acetate-betamethasone sodium phosphate (CELESTONE) injection 3 mg  3 mg Intramuscular Once Evans, Brent M, DPM      . triamcinolone acetonide (KENALOG) 10 MG/ML injection 10 mg  10 mg Other Once , , DPM        Allergies  Allergen Reactions  . Latex Itching, Rash and Other (See Comments)    Pt states she cannot use condoms - cause an infection.  Use of latex on skin is okay.  Tape causes rash  . Sweetening Enhancer [Flavoring Agent] Nausea And Vomiting and Other (See Comments)    HEADACHES  . Tetracyclines & Related Nausea And Vomiting and Other (See Comments)    ;YEAST INFECTIONS  . Aspartame And Phenylalanine   Nausea And Vomiting    HEADACHES  . Ibuprofen Other (See Comments)    HEADACHES  . Trazodone And Nefazodone Other (See Comments)    Hallucinations   . Triazolam Other (See Comments)    HALLUCINATIONS    Objective: There were no vitals filed for this visit.  General: No acute distress, AAOx3  Right foot:  At right medial midfoot ulceration is well healed with no surrounding infection or re-opening, mild swelling right foot, no streaking erythema/cellulitis, no warmth, no drainage, no gross signs of infection noted, Capillary  fill time <3 seconds in all digits, gross sensation present via light touch to right foot. Protective sensation absent. 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 No pain or crepitation with range of motion right foot.  No pain with calf compression.   To left foot there is partial thickness ulceration with granular base that measures 0.8x0.6x0.3cm smaller in size sub met 1 with no active drainage, decreased maceration, no odor, no other signs of infection. Subjective occasional pain to left foot pain. No other issues.  Assessment and Plan:  Problem List Items Addressed This Visit    None    Visit Diagnoses    Foot ulceration, left, limited to breakdown of skin ([place])    -  Primary   Diabetic polyneuropathy associated with type 2 diabetes mellitus ([place])       Diabetic Charcot's foot ([place])       Left foot pain         -Patient seen and evaluated -Debrided ulceration at left sub met 1 using sterile tissue nipper to the level of the dermis in an excisional fashion to healthy bleeding. Post debridement measurements as above. Applied Epifix 87m disk, graft #3 to site, re-order # [id number]  Tissue id # [id number] secured with steri-strips covered with adaptic and dry dressing. Advised patient to change outer dressing with help of boyfriend or nursing 1-2x per week. -Cont with Lidocaine patch as needed for pain. NO NARCOTICS -Advised patient to continue with custom diabetic shoes with offloading -Advised patient to limit activity to necessity  -Advised again patient to elevate as necessary and to take it easy while the graft incorporates this week  -Return 2 weeks for follow up eval of left foot ulcer possible re-graft.  In the meantime, patient to call office if any issues or problems arise.  TLandis Martins DPM

## 2017-09-27 ENCOUNTER — Ambulatory Visit
Admission: RE | Admit: 2017-09-27 | Discharge: 2017-09-27 | Disposition: A | Discharge status: Home / Self Care | Payer: Medicare Other | Source: Ambulatory Visit | Attending: Neurosurgery | Admitting: Neurosurgery

## 2017-09-27 DIAGNOSIS — N9089 Other specified noninflammatory disorders of vulva and perineum: Secondary | ICD-10-CM | POA: Insufficient documentation

## 2017-09-27 DIAGNOSIS — M5412 Radiculopathy, cervical region: Secondary | ICD-10-CM

## 2017-09-28 ENCOUNTER — Other Ambulatory Visit: Payer: Self-pay | Admitting: Urology

## 2017-09-29 ENCOUNTER — Other Ambulatory Visit: Payer: Self-pay | Admitting: Urology

## 2017-09-29 ENCOUNTER — Telehealth: Payer: Self-pay | Admitting: *Deleted

## 2017-09-29 NOTE — Telephone Encounter (Signed)
Emailed order for 4th 21mm EpiFix from [place] to be applied on 10/10/2017.

## 2017-10-03 ENCOUNTER — Telehealth: Payer: Self-pay | Admitting: Sports Medicine

## 2017-10-03 NOTE — Telephone Encounter (Signed)
This is [name], Therapist, sports with [facility]. I was calling to let you know that the pt had a fall about 2 weeks ago. Her fiancee said she was taken to [facility] ED and released. I don't believe any new orders were given. She was given dilaudid for pain and had bruises. I just wanted to call and let you know. My number is [phone].

## 2017-10-03 NOTE — Telephone Encounter (Signed)
Thanks

## 2017-10-09 ENCOUNTER — Ambulatory Visit (INDEPENDENT_AMBULATORY_CARE_PROVIDER_SITE_OTHER): Payer: Medicare Other | Admitting: Endocrinology

## 2017-10-09 ENCOUNTER — Encounter: Payer: Self-pay | Admitting: Endocrinology

## 2017-10-09 VITALS — BP 132/80 | HR 95 | Ht 65.0 in | Wt 175.0 lb

## 2017-10-09 DIAGNOSIS — E1165 Type 2 diabetes mellitus with hyperglycemia: Secondary | ICD-10-CM

## 2017-10-09 DIAGNOSIS — Z23 Encounter for immunization: Secondary | ICD-10-CM | POA: Diagnosis not present

## 2017-10-09 DIAGNOSIS — Z794 Long term (current) use of insulin: Secondary | ICD-10-CM

## 2017-10-09 DIAGNOSIS — E1142 Type 2 diabetes mellitus with diabetic polyneuropathy: Secondary | ICD-10-CM

## 2017-10-09 DIAGNOSIS — C187 Malignant neoplasm of sigmoid colon: Secondary | ICD-10-CM | POA: Diagnosis not present

## 2017-10-09 DIAGNOSIS — R252 Cramp and spasm: Secondary | ICD-10-CM

## 2017-10-09 LAB — GLUCOSE, POCT (MANUAL RESULT ENTRY): POC GLUCOSE: 212 mg/dL — AB (ref 70–99)

## 2017-10-09 LAB — BASIC METABOLIC PANEL
BUN: 10 mg/dL (ref 6–23)
CO2: 28 mEq/L (ref 19–32)
CREATININE: 0.68 mg/dL (ref 0.40–1.20)
Calcium: 10 mg/dL (ref 8.4–10.5)
Chloride: 93 mEq/L — ABNORMAL LOW (ref 96–112)
GFR: 96.43 mL/min (ref >60.00)
Glucose, Bld: 213 mg/dL — ABNORMAL HIGH (ref 70–99)
Potassium: 4.4 mEq/L (ref 3.5–5.1)
Sodium: 129 mEq/L — ABNORMAL LOW (ref 135–145)

## 2017-10-09 LAB — MAGNESIUM: Magnesium: 2 mg/dL (ref 1.5–2.5)

## 2017-10-09 LAB — MICROALBUMIN / CREATININE URINE RATIO
CREATININE, U: 28.7 mg/dL
MICROALB UR: 0.9 mg/dL (ref 0.0–1.9)
Microalb Creat Ratio: 3.1 mg/g (ref 0.0–30.0)

## 2017-10-09 NOTE — Patient Instructions (Addendum)
Check blood sugars on waking up  at least every other day  Also check blood sugars about 2 hours after a meal   Recommended blood sugar levels on waking up is 90-130 and about 2 hours after meal is 130-180  Blood sugars should not be over 200 at any time  Please bring your blood sugar monitor to each visit, thank you  BASAGLAR insulin:  Must take at least 35 units in the morning once a day  NOVOLOG: Take this only at mealtimes as follows: With small meals takes 10 units At suppertime take 15 units consistently Also take 10 units when drinking a large amount of sweet tea  If blood sugars are over 200 may take 5-10 units extra to bring the blood sugar down

## 2017-10-09 NOTE — Progress Notes (Signed)
Patient ID: [name], female   DOB: [birth year], 51 y.o.   MRN: [id number]   Reason for Appointment: Diabetes follow-up    History of Present Illness:   Diagnosis: Type 2 diabetes mellitus, date of diagnosis: 2004.  PAST history: She has been treated mostly with insulin since about a year after diagnosis. She has had difficulty with consistent compliance with diet and also compliance with self care including glucose monitoring over the years. She had been mostly treated with basal insulin. Also had been tried on mealtime insulin but she would be noncompliant with this. Was tried on Prandin for mealtime control but difficult to judge efficacy because of lack of postprandial monitoring. Was given Victoza to start in 2011 but did not follow up after this.She was tried on premixed insulin but this did not help her control, mostly because of noncompliance with the doses. She had been using the V- go pump and had better control initially and was better compliant with the daily routine and boluses. She has had frequent education visits also. She stopped using her V.-go pump in 6/15 because of discomfort at the site of application Also did not improve with a trial of Victoza   RECENT history:    INSULIN dose: Basaglar 0 units .  NovoLog 20 units in the morning, 10 at suppertime Non-insulin hypoglycemic drugs: None  Her most recent A1c in August was 8.8, previously over 15  Current management, problems identified:   She has been given specific instructions on taking her basal bolus insulin regimen but she again has not followed directions  Do not understand why she stopped taking her Basaglar.  Her fianc continues to adjust her insulin based on what he thinks is needed and this is different every time  Fasting blood sugars are well over 200 without Basaglar and he is not concerned about his  She is checking her blood sugars infrequently and probably only once a day at the most,  not clear if blood sugars are consistently high  However blood sugars after evening meal are usually 200 also with taking only 10 units of Novolog: She was supposed to take a higher dose of NovoLog in the evening  She continues to drink large amounts of sweet tea and regular soft drinks  Previously was getting NovoLog AFTER a meal in the evening and most likely he is doing this the same weight despite instructions  Also not following her diet, had dessert today before coming here without any other food today  She did however start her 300 mg Invokana:, her weight is down slightly   Glucometer:  FreeStyle.   Blood Glucose readings by recall:     Mean values apply above for all meters except median for One Touch  PRE-MEAL Fasting Lunch Dinner Bedtime Overall  Glucose range: 260      Mean/median:        POST-MEAL PC Breakfast PC Lunch PC Dinner  Glucose range:   250   Mean/median:       Hypoglycemia frequency: Never.    Food preferences: eating Mostly 1 meals per day, variable intake, sandwiches at times or otherwise snacks, still getting regular drinks and sweet tea  Physical activity: exercise: Minimal.  She has difficulties with leg pain and weakness  Certified Diabetes Educator visit: Most recent:, 2/14.     Wt Readings from Last 3 Encounters:  10/09/17 175 lb (79.4 kg)  09/08/17 179 lb 14.4 oz (81.6 kg)  08/14/17 181 lb  6 oz (82.3 kg)   DM labs:   Lab Results  Component Value Date   HGBA1C 8.8 08/08/2017   HGBA1C >15 05/24/2017   HGBA1C 10.1 12/28/2016   Lab Results  Component Value Date   MICROALBUR 0.4 10/19/2016   LDLCALC 41 08/08/2017   CREATININE 0.7 09/08/2017       Other active problems: See review of systems    Allergies as of 10/09/2017      Reactions   Latex Itching, Rash, Other (See Comments)   Pt states she cannot use condoms - cause an infection.  Use of latex on skin is okay.  Tape causes rash   Sweetening Enhancer [flavoring Agent]  Nausea And Vomiting, Other (See Comments)   HEADACHES   Tetracyclines & Related Nausea And Vomiting, Other (See Comments)   ;YEAST INFECTIONS   Aspartame And Phenylalanine Nausea And Vomiting   HEADACHES   Ibuprofen Other (See Comments)   HEADACHES   Trazodone And Nefazodone Other (See Comments)   Hallucinations   Triazolam Other (See Comments)   HALLUCINATIONS      Medication List       Accurate as of 10/09/17  5:08 PM. Always use your most recent med list.          BASAGLAR KWIKPEN 100 UNIT/ML Sopn Inject 0.1 mLs (10 Units total) into the skin every morning. And pen needles 1/day   canagliflozin 300 MG Tabs tablet Commonly known as:  INVOKANA Take 1 tablet (300 mg total) by mouth daily before breakfast.   gabapentin 600 MG tablet Commonly known as:  NEURONTIN Take 1 tablet (600 mg total) by mouth 2 (two) times daily.   glucose blood test strip Commonly known as:  FREESTYLE LITE Use to check blood sugars up to 4 times daily. Dx Code E11.65   insulin aspart 100 UNIT/ML FlexPen Commonly known as:  NOVOLOG Take 10 units before your main meal. If blood sugars are consistently over 250 after the meal start taking 14 units before your main meal.   lidocaine 5 % Commonly known as:  LIDODERM Place 1 patch onto the skin daily. Remove & Discard patch within 12 hours or as directed by MD   oxyCODONE 5 MG immediate release tablet Commonly known as:  Oxy IR/ROXICODONE Take 5 mg by mouth daily.   tamsulosin 0.4 MG Caps capsule Commonly known as:  FLOMAX Take 0.4 mg by mouth daily.   zolpidem 10 MG tablet Commonly known as:  AMBIEN Take 1 tablet (10 mg total) by mouth at bedtime as needed for sleep.       Allergies:  Allergies  Allergen Reactions  . Latex Itching, Rash and Other (See Comments)    Pt states she cannot use condoms - cause an infection.  Use of latex on skin is okay.  Tape causes rash  . Sweetening Enhancer [Flavoring Agent] Nausea And Vomiting and  Other (See Comments)    HEADACHES  . Tetracyclines & Related Nausea And Vomiting and Other (See Comments)    ;YEAST INFECTIONS  . Aspartame And Phenylalanine Nausea And Vomiting    HEADACHES  . Ibuprofen Other (See Comments)    HEADACHES  . Trazodone And Nefazodone Other (See Comments)    Hallucinations   . Triazolam Other (See Comments)    HALLUCINATIONS    Past Medical History:  Diagnosis Date  . Anginal pain ([place])   . Anxiety   . Arthritis    "qwhere" (02/12/2014)  . Bipolar disorder ([place])   .  Cancer [facility])    colon cancer  . Chest pain    a. 2008 Cath: normal cors;  b. 12/2013 Lexi CL: EF 47%, no ischemia/infarct.  . Cholecystitis   . Chronic back pain   . Chronic pain   . Claudication ([place])    a. 12/2013 ABI's: R 0.97, L 0.94.  [name] COPD (chronic obstructive pulmonary disease) ([place])   . Depression   . Diabetic foot ulcer ([facility])    chronic  . DJD (degenerative joint disease)   . Edema   . Gastroparesis   . GERD (gastroesophageal reflux disease)   . H/O hiatal hernia   . TOIZTIWP(809.9)    "weekly" (02/12/2014)  . History of blood transfusion 1986   "related to lost a child" (02/12/2014)  . Hypercholesterolemia   . Hyperlipemia   . Hypersomnia   . Hypertension   . Insomnia   . MRSA (methicillin resistant Staphylococcus aureus)   . Neurogenic bladder   . OSA (obstructive sleep apnea) 07/04/2014  . Palpitations   . Personality disorder ([place])   . Pneumonia    "twice, I think" (02/12/2014)  . Sleep apnea    didn't tolerate cpap (02/12/2014)  . SOB (shortness of breath) on exertion   . Stroke ([place]) 02/12/2014   "eyesight is messed up; not steady on my feet" (02/12/2014)  . SUI (stress urinary incontinence, female) S/P SLING 12-29-2011  . Tobacco abuse   . Type II diabetes mellitus ([place])   . Ulcer   . Vertigo     Past Surgical History:  Procedure Laterality Date  . ABDOMINAL HYSTERECTOMY    . ANTERIOR CERVICAL DECOMP/DISCECTOMY FUSION  2000   C6 - 7  . CARDIAC  CATHETERIZATION  05-22-2008   DR SKAINS   NO SIGNIFECANT CAD/ NORMAL LV/  EF 65%/  NO WALL MOTION ABNORMALITIES  . CARDIAC CATHETERIZATION  02/12/2014  . CARPAL TUNNEL RELEASE Right 04-25-2013  . [place]; 1992  . CYSTO N/A 04/30/2013   Procedure: CYSTOSCOPY;  Surgeon: Reece Packer, MD;  Location: [facility];  Service: Urology;  Laterality: N/A;  . CYSTOSCOPY WITH INJECTION  05/04/2012   Procedure: CYSTOSCOPY WITH INJECTION;  Surgeon: Reece Packer, MD;  Location: [place];  Service: Urology;  Laterality: N/A;  MACROPLASTIQUE INJECTION  . CYSTOSCOPY WITH INJECTION  08/28/2012   Procedure: CYSTOSCOPY WITH INJECTION;  Surgeon: Reece Packer, MD;  Location[facility];  Service: Urology;  Laterality: N/A;  cysto and macroplastique   . FOOT SURGERY Bilateral   . HERNIA REPAIR  ?1996   "stomach"  . KNEE ARTHROSCOPY W/ ALLOGRAFT IMPANT Left    graft x 2  . KNEE SURGERY     TOTAL 8 SURG'S  . LAPAROSCOPIC SIGMOID COLECTOMY  04/22/2015  . LAPAROSCOPIC SIGMOID COLECTOMY N/A 04/22/2015   Procedure: LAPAROSCOPIC HAND ASSISTED SIGMOID COLECTOMY;  Surgeon: Erroll Luna, MD;  Location: [place];  Service: General;  Laterality: N/A;  . LEFT HEART CATHETERIZATION WITH CORONARY ANGIOGRAM N/A 02/12/2014   Procedure: LEFT HEART CATHETERIZATION WITH CORONARY ANGIOGRAM;  Surgeon: Candee Furbish, MD;  Location[facility] C[facility];  Service: Cardiovascular;  Laterality: N/A;  . LUMBAR FUSION     "cage in my spine"  . MULTIPLE LAPAROSCOPIES FOR ENDOMETRIOSIS    . PUBOVAGINAL SLING  12/29/2011   Procedure: Gaynelle Arabian;  Surgeon: Reece Packer, MD;  Location: [facility];  Service: Urology;  Laterality: N/A;  cysto and sparc sling   . PUBOVAGINAL SLING  N/A 04/30/2013   Procedure: REMOVAL OF VAGINAL MESH;  Surgeon: Reece Packer, MD;  Location: [facility];  Service: Urology;  Laterality: N/A;  . RECONSTURCTION OF CONGENITAL UTERUS ANOMALY  1983  .  REPEAT RECONSTRUCTION ACL LEFT KNEE/ SCREWS REMOVED  03-28-2000   CADAVER GRAFT  . TOTAL ABDOMINAL HYSTERECTOMY W/ BILATERAL SALPINGOOPHORECTOMY  1997  . WOUND DEBRIDEMENT Right 09/05/2016   Procedure: DEBRIDEMENT WOUND WITH GRAFT RIGHT FOOT;  Surgeon: Landis Martins, DPM;  Location: [place];  Service: Podiatry;  Laterality: Right;    Family History  Problem Relation Age of Onset  . Hypertension Mother   . Diabetes Mother   . Cancer - Other Mother        lymphoma   . Cancer - Other Father        lung, bladder cancer   . Heart attack Father   . Cancer - Other Brother        bladder cancer     Social History:  reports that she has been smoking Cigarettes and E-cigarettes.  She has a 82.00 pack-year smoking history. She has never used smokeless tobacco. She reports that she does not drink alcohol or use drugs.  Review of Systems -    She has history of high triglycerides previously treated with fenofibrate and is not on any treatment now   Lab Results  Component Value Date   CHOL 103 08/08/2017   HDL 38.00 (L) 08/08/2017   LDLCALC 41 08/08/2017   LDLDIRECT 46.0 12/28/2016   TRIG 118.0 08/08/2017   CHOLHDL 3 08/08/2017     HYPOKALEMIA:  Her last potassium was normal  She is however complaining of leg cramps more recently now asking for medication for this   Lab Results  Component Value Date   CREATININE 0.7 09/08/2017   BUN 6.3 (L) 09/08/2017   NA 137 09/08/2017   K 3.8 09/08/2017   CL 108 08/08/2017   CO2 23 09/08/2017    Painful neuropathy: She has had persistent symptoms including leg pains and numbness as well as difficulty with balance  Previously has tried various drugs including gabapentin without much relief   Foot exam Last showed absent pedal pulses and Absent distal sensation  Followed by podiatrist in August    Examination:   BP 132/80   Pulse 95   Ht 5\' 5"  (1.651 m)   Wt 175 lb (79.4 kg)   SpO2 98%   BMI 29.12 kg/m   Body mass index is  29.12 kg/m.      Assesment/Plan:   Diabetes type 2, with persistently poor control,   See history of present illness for detailed discussion of current diabetes management, blood sugar patterns and problems identified  Her management is difficult because of lack of understanding how to manage her diabetes and her fianc is adjusting her insulin randomly without any basis  Most of her visit today was related to educating her and her fianc on difference between basal and bolus insulin Discussed also blood sugar targets at various times Discussed need to cover all meals and high carbohydrate intake with bolus insulin NovoLog Also NovoLog dose should be in proportion to the type of meal and carbohydrate intake   Specific instructions for monitoring and insulin as well as Invokana were reviewed in detail with the patient and her boyfriend  She will again take Basaglar once a day   Discussed that fasting blood sugar will help adjust her Basaglar insulin starting with 35 units  She does  not need to take NovoLog in the morning if not eating a meal  She needs more readings before and after supper  Needs to bring her monitor for download  Continue Invokana daily  Again encouraged her to increase water intake instead of sweet drinks   History of electrolyte abnormalities and complaints of muscle cramps: Will need follow-up labs today including medication  Influenza vaccine given  Patient Instructions  Check blood sugars on waking up  at least every other day  Also check blood sugars about 2 hours after a meal   Recommended blood sugar levels on waking up is 90-130 and about 2 hours after meal is 130-180  Blood sugars should not be over 200 at any time  Please bring your blood sugar monitor to each visit, thank you  BASAGLAR insulin:  Must take at least 35 units in the morning once a day  NOVOLOG: Take this only at mealtimes as follows: With small meals takes 10 units At  suppertime take 15 units consistently Also take 10 units when drinking a large amount of sweet tea  If blood sugars are over 200 may take 5-10 units extra to bring the blood sugar down   Counseling time on subjects discussed in assessment and plan sections is over 50% of today's 25 minute visit      Viki Carrera 10/09/2017, 5:08 PM                                                   6

## 2017-10-10 ENCOUNTER — Encounter: Payer: Self-pay | Admitting: Sports Medicine

## 2017-10-10 ENCOUNTER — Ambulatory Visit (INDEPENDENT_AMBULATORY_CARE_PROVIDER_SITE_OTHER): Payer: Medicare Other | Admitting: Sports Medicine

## 2017-10-10 DIAGNOSIS — L97521 Non-pressure chronic ulcer of other part of left foot limited to breakdown of skin: Secondary | ICD-10-CM

## 2017-10-10 DIAGNOSIS — R252 Cramp and spasm: Secondary | ICD-10-CM

## 2017-10-10 DIAGNOSIS — E1142 Type 2 diabetes mellitus with diabetic polyneuropathy: Secondary | ICD-10-CM

## 2017-10-10 DIAGNOSIS — E11610 Type 2 diabetes mellitus with diabetic neuropathic arthropathy: Secondary | ICD-10-CM

## 2017-10-10 DIAGNOSIS — M79672 Pain in left foot: Secondary | ICD-10-CM

## 2017-10-10 LAB — FRUCTOSAMINE: FRUCTOSAMINE: 326 umol/L — AB (ref 0–285)

## 2017-10-10 MED ORDER — CYCLOBENZAPRINE HCL 10 MG PO TABS: 10.0000 mg | ORAL_TABLET | Freq: Three times a day (TID) | ORAL | 0 refills | Status: DC | PRN

## 2017-10-10 MED ORDER — ROPINIROLE HCL 0.25 MG PO TABS: 0.2500 mg | ORAL_TABLET | Freq: Three times a day (TID) | ORAL | 0 refills | Status: DC

## 2017-10-10 NOTE — Progress Notes (Addendum)
Subjective: [name] is a 51 y.o. female patient seen today in office for follow up left foot pain and ulceration on left; EPIFIX Graft #3 placed 2 weeks ago. Denies calf pain, denies headache, chest pain, shortness of breath, nausea, vomiting, fever, or chills. Admits that she fell and has some brusing and was given Dilaudid from ED. Reports that she is concerned about her shoes wearing out and rubbing.Patient states nursing has been coming with no issues. Denies constitutional symptoms. Foot pain baseline 5/10.   FBS elevated   Patient Active Problem List   Diagnosis Date Noted  . Cancer of sigmoid colon ([place]) 03/09/2015  . OSA (obstructive sleep apnea) 07/04/2014  . Migraine without aura, with intractable migraine, so stated, without mention of status migrainosus 03/26/2014  . Peripheral neuropathy 03/03/2014  . Abnormal stress test 02/12/2014  . CVA (cerebral infarction) 02/12/2014  . Chest pain   . Abnormal heart rhythm   . Edema   . Hypertension   . Hyperlipemia   . Hypercholesterolemia   . Other and unspecified hyperlipidemia 11/14/2013  . Type II diabetes mellitus, uncontrolled ([place]) 07/22/2013  . Essential hypertension 07/22/2013  . 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 Diabetic polyneuropathy ([place]) 07/22/2013    Current Outpatient Prescriptions on File Prior to Visit  Medication Sig Dispense Refill  . canagliflozin (INVOKANA) 300 MG TABS tablet Take 1 tablet (300 mg total) by mouth daily before breakfast. 30 tablet 3  . gabapentin (NEURONTIN) 600 MG tablet Take 1 tablet (600 mg total) by mouth 2 (two) times daily. 60 tablet 5  . glucose blood (FREESTYLE LITE) test strip Use to check blood sugars up to 4 times daily. Dx Code E11.65 100 each 3  . insulin aspart (NOVOLOG) 100 UNIT/ML FlexPen Take 10 units before your main meal. If blood sugars are consistently over 250 after the meal start taking 14  units before your main meal. (Patient taking differently: Inject 20 Units into the skin every morning. ) 45 mL 2  . Insulin Glargine (BASAGLAR KWIKPEN) 100 UNIT/ML SOPN Inject 0.1 mLs (10 Units total) into the skin every morning. And pen needles 1/day (Patient taking differently: Inject 40 Units into the skin at bedtime. ) 15 pen 2  . lidocaine (LIDODERM) 5 % Place 1 patch onto the skin daily. Remove & Discard patch within 12 hours or as directed by MD 30 patch 0  . oxyCODONE (OXY IR/ROXICODONE) 5 MG immediate release tablet Take 5 mg by mouth daily.     . tamsulosin (FLOMAX) 0.4 MG CAPS capsule Take 0.4 mg by mouth daily.     [name] zolpidem (AMBIEN) 10 MG tablet Take 1 tablet (10 mg total) by mouth at bedtime as needed for sleep. 30 tablet 0   Current Facility-Administered Medications on File Prior to Visit  Medication Dose Route Frequency Provider Last Rate Last Dose  . betamethasone acetate-betamethasone sodium phosphate (CELESTONE) injection 3 mg  3 mg Intramuscular Once Daylene Katayama M, DPM      . triamcinolone acetonide (KENALOG) 10 MG/ML injection 10 mg  10 mg Other Once Landis Martins, DPM        Allergies  Allergen Reactions  . Latex Itching, Rash and Other (See Comments)    Pt states she cannot use condoms - cause an infection.  Use of latex on skin is okay.  Tape causes rash  . Sweetening Enhancer [Flavoring Agent] Nausea And Vomiting and Other (See Comments)  HEADACHES  . Tetracyclines & Related Nausea And Vomiting and Other (See Comments)    ;YEAST INFECTIONS  . Aspartame And Phenylalanine Nausea And Vomiting    HEADACHES  . Ibuprofen Other (See Comments)    HEADACHES  . Trazodone And Nefazodone Other (See Comments)    Hallucinations   . Triazolam Other (See Comments)    HALLUCINATIONS    Objective: There were no vitals filed for this visit.  General: No acute distress, AAOx3  Right foot:  At right medial midfoot ulceration is well healed with no surrounding infection  or re-opening, mild swelling right foot, no streaking erythema/cellulitis, no warmth, no drainage, no gross signs of infection noted, Capillary fill time <3 seconds in all digits, gross sensation present via light touch to right foot. Protective sensation absent. 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 No pain or crepitation with range of motion right foot.  No pain with calf compression.   To left foot there is partial thickness ulceration with granular base that measures 1x0.6x0.3cm larger in size sub met 1 with no active drainage, decreased maceration, no odor, no other signs of infection. Subjective occasional pain to left foot pain.   Subjective cramping in both legs. No other issues.  Assessment and Plan:  Problem List Items Addressed This Visit      Endocrine   Diabetic polyneuropathy ([place])   Relevant Medications   rOPINIRole (REQUIP) 0.25 MG tablet   cyclobenzaprine (FLEXERIL) 10 MG tablet    Other Visit Diagnoses    Foot ulceration, left, limited to breakdown of skin ([place])    -  Primary   Diabetic Charcot's foot ([facility])       Relevant Medications   cyclobenzaprine (FLEXERIL) 10 MG tablet   Left foot pain       Leg cramping       Relevant Medications   rOPINIRole (REQUIP) 0.25 MG tablet     -Patient seen and evaluated -Debrided ulceration at left sub met 1 using sterile tissue nipper to the level of the dermis in an excisional fashion to healthy bleeding. Post debridement measurements as above. Applied Epifix 79m disk, graft #4 to site, re-order # [id number]  Tissue id # [id number] secured with steri-strips covered with adaptic and dry dressing. Advised patient to change outer dressing with help of boyfriend or nursing 1-2x per week. -Cont with Lidocaine patch as needed for pain. NO NARCOTICS -Rx Requip or flexeril for subjective cramping and restless legs  -Advised patient to continue with custom diabetic shoes with  offloading; patient met with BBenjie Karvonenwho further offloaded her shoes  -Advised patient to limit activity to necessity  -Advised again patient to elevate as necessary and to take it easy while the graft incorporates this week  -Return 2 weeks for follow up eval of left foot ulcer possible re-graft.  In the meantime, patient to call office if any issues or problems arise.  TLandis Martins DPM

## 2017-10-11 ENCOUNTER — Encounter (HOSPITAL_BASED_OUTPATIENT_CLINIC_OR_DEPARTMENT_OTHER): Payer: Self-pay | Admitting: *Deleted

## 2017-10-16 ENCOUNTER — Encounter (HOSPITAL_BASED_OUTPATIENT_CLINIC_OR_DEPARTMENT_OTHER): Payer: Self-pay | Admitting: *Deleted

## 2017-10-17 ENCOUNTER — Encounter (HOSPITAL_BASED_OUTPATIENT_CLINIC_OR_DEPARTMENT_OTHER): Payer: Self-pay | Admitting: *Deleted

## 2017-10-17 NOTE — Progress Notes (Signed)
NPO AFTER MN.  ARRIVE AT 0530.  ISTAT 8. CURRENT BMET (10-09-2017) AND EKG W/ CHART AND EPIC.  WILL TAKE REQUIP, GABAPENTIN, FLOMAX, NEXIUM AM DOS W/ SIPS OF WATER.

## 2017-10-19 ENCOUNTER — Encounter (HOSPITAL_BASED_OUTPATIENT_CLINIC_OR_DEPARTMENT_OTHER): Payer: Self-pay | Admitting: Anesthesiology

## 2017-10-19 ENCOUNTER — Ambulatory Visit (HOSPITAL_BASED_OUTPATIENT_CLINIC_OR_DEPARTMENT_OTHER)
Admission: RE | Admit: 2017-10-19 | Discharge: 2017-10-19 | Disposition: A | Discharge status: Home / Self Care | Payer: Medicare Other | Source: Ambulatory Visit | Attending: Urology | Admitting: Urology

## 2017-10-19 ENCOUNTER — Encounter (HOSPITAL_BASED_OUTPATIENT_CLINIC_OR_DEPARTMENT_OTHER)
Admission: RE | Disposition: A | Discharge status: Home / Self Care | Payer: Self-pay | Source: Ambulatory Visit | Attending: Urology

## 2017-10-19 ENCOUNTER — Encounter (HOSPITAL_BASED_OUTPATIENT_CLINIC_OR_DEPARTMENT_OTHER): Payer: Self-pay | Admitting: *Deleted

## 2017-10-19 DIAGNOSIS — N393 Stress incontinence (female) (male): Secondary | ICD-10-CM | POA: Diagnosis present

## 2017-10-19 DIAGNOSIS — N3642 Intrinsic sphincter deficiency (ISD): Secondary | ICD-10-CM | POA: Insufficient documentation

## 2017-10-19 DIAGNOSIS — Z539 Procedure and treatment not carried out, unspecified reason: Secondary | ICD-10-CM | POA: Insufficient documentation

## 2017-10-19 HISTORY — DX: Type 2 diabetes mellitus with diabetic neuropathic arthropathy: E11.610

## 2017-10-19 HISTORY — DX: Personal history of other malignant neoplasm of large intestine: Z85.038

## 2017-10-19 HISTORY — DX: Localized edema: R60.0

## 2017-10-19 HISTORY — DX: Reserved for concepts with insufficient information to code with codable children: IMO0002

## 2017-10-19 HISTORY — DX: Personal history of Methicillin resistant Staphylococcus aureus infection: Z86.14

## 2017-10-19 HISTORY — DX: Diaphragmatic hernia without obstruction or gangrene: K44.9

## 2017-10-19 HISTORY — DX: Type 2 diabetes mellitus with hyperglycemia: E11.65

## 2017-10-19 HISTORY — DX: Long term (current) use of insulin: Z79.4

## 2017-10-19 HISTORY — DX: Unspecified sequelae of cerebral infarction: I69.30

## 2017-10-19 HISTORY — DX: Type 2 diabetes mellitus with diabetic neuropathy, unspecified: E11.40

## 2017-10-19 LAB — POCT I-STAT, CHEM 8
BUN: 6 mg/dL (ref 6–20)
CHLORIDE: 96 mmol/L — AB (ref 101–111)
CREATININE: 0.6 mg/dL (ref 0.44–1.00)
Calcium, Ion: 1.28 mmol/L (ref 1.15–1.40)
GLUCOSE: 420 mg/dL — AB (ref 65–99)
HCT: 40 % (ref 36.0–46.0)
Hemoglobin: 13.6 g/dL (ref 12.0–15.0)
POTASSIUM: 4.2 mmol/L (ref 3.5–5.1)
Sodium: 134 mmol/L — ABNORMAL LOW (ref 135–145)
TCO2: 23 mmol/L (ref 22–32)

## 2017-10-19 SURGERY — CYSTOSCOPY, WITH MACROPLASTIQUE INJECTION
Anesthesia: Choice

## 2017-10-19 MED ORDER — CIPROFLOXACIN IN D5W 400 MG/200ML IV SOLN
INTRAVENOUS | Status: AC
  Filled 2017-10-19: qty 200

## 2017-10-19 MED ORDER — CIPROFLOXACIN IN D5W 400 MG/200ML IV SOLN
400.0000 mg | INTRAVENOUS | Status: DC
  Filled 2017-10-19: qty 200

## 2017-10-19 MED ORDER — LACTATED RINGERS IV SOLN
INTRAVENOUS | Status: DC
  Administered 2017-10-19: 06:00:00 via INTRAVENOUS
  Filled 2017-10-19 (×2): qty 1000

## 2017-10-19 NOTE — Progress Notes (Signed)
Patient blood sugar 420.. Surgery cancel by Dr.Miller and Dr. Matilde Sprang.  Patient was instructed to follow up with primary doctor and that surgery would not be done until sugar is under control per Dr.Macdiarmid.

## 2017-10-26 ENCOUNTER — Ambulatory Visit (HOSPITAL_COMMUNITY): Payer: Medicare Other | Admitting: Psychiatry

## 2017-10-31 ENCOUNTER — Encounter: Payer: Self-pay | Admitting: Sports Medicine

## 2017-10-31 ENCOUNTER — Other Ambulatory Visit: Payer: Self-pay | Admitting: Endocrinology

## 2017-10-31 ENCOUNTER — Ambulatory Visit (INDEPENDENT_AMBULATORY_CARE_PROVIDER_SITE_OTHER): Payer: Medicare Other | Admitting: Sports Medicine

## 2017-10-31 DIAGNOSIS — L97521 Non-pressure chronic ulcer of other part of left foot limited to breakdown of skin: Secondary | ICD-10-CM | POA: Diagnosis not present

## 2017-10-31 DIAGNOSIS — E11610 Type 2 diabetes mellitus with diabetic neuropathic arthropathy: Secondary | ICD-10-CM

## 2017-10-31 DIAGNOSIS — M79672 Pain in left foot: Secondary | ICD-10-CM

## 2017-10-31 DIAGNOSIS — E1142 Type 2 diabetes mellitus with diabetic polyneuropathy: Secondary | ICD-10-CM

## 2017-10-31 MED ORDER — LEVOFLOXACIN 500 MG PO TABS: 500.0000 mg | ORAL_TABLET | Freq: Every day | ORAL | 0 refills | Status: DC

## 2017-10-31 MED ORDER — CYCLOBENZAPRINE HCL 10 MG PO TABS: 10.0000 mg | ORAL_TABLET | Freq: Three times a day (TID) | ORAL | 0 refills | Status: DC | PRN

## 2017-10-31 MED ORDER — SILVER SULFADIAZINE 1 % EX CREA: 1.0000 "application " | TOPICAL_CREAM | Freq: Every day | CUTANEOUS | 0 refills | Status: DC

## 2017-10-31 MED ORDER — FLUCONAZOLE 150 MG PO TABS: 150.0000 mg | ORAL_TABLET | Freq: Once | ORAL | 0 refills | Status: AC

## 2017-10-31 NOTE — Progress Notes (Signed)
Subjective: [name] is a 51 y.o. female patient seen today in office for follow up left foot pain and ulceration on left; EPIFIX Graft #4 placed 2 weeks ago. Denies calf pain, denies headache, chest pain, shortness of breath, nausea, vomiting, fever, or chills. Admits that she has increased burning pain and bloody drainage from left foot ulcer.Patient states nursing has been coming with no issues. Denies constitutional symptoms.   FBS elevated 152 this AM   Patient Active Problem List   Diagnosis Date Noted  . Cancer of sigmoid colon ([place]) 03/09/2015  . OSA (obstructive sleep apnea) 07/04/2014  . Migraine without aura, with intractable migraine, so stated, without mention of status migrainosus 03/26/2014  . Peripheral neuropathy 03/03/2014  . Abnormal stress test 02/12/2014  . CVA (cerebral infarction) 02/12/2014  . Chest pain   . Abnormal heart rhythm   . Edema   . Hypertension   . Hyperlipemia   . Hypercholesterolemia   . Other and unspecified hyperlipidemia 11/14/2013  . Type II diabetes mellitus, uncontrolled ([place]) 07/22/2013  . Essential hypertension 07/22/2013  . 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 Diabetic polyneuropathy ([place]) 07/22/2013    Current Outpatient Medications on File Prior to Visit  Medication Sig Dispense Refill  . esomeprazole (NEXIUM) 20 MG capsule Take 20 mg by mouth as needed.    . gabapentin (NEURONTIN) 600 MG tablet Take 1 tablet (600 mg total) by mouth 2 (two) times daily. (Patient taking differently: Take 600 mg by mouth 2 (two) times daily. ) 60 tablet 5  . glucose blood (FREESTYLE LITE) test strip Use to check blood sugars up to 4 times daily. Dx Code E11.65 100 each 3  . insulin aspart (NOVOLOG) 100 UNIT/ML FlexPen Take 10 units before your main meal. If blood sugars are consistently over 250 after the meal start taking 14 units before your main meal. (Patient taking  differently: Inject into the skin every morning. Per pt does not eat breakfast--- pt doesn't eat until the evening ?compliant) 45 mL 2  . Insulin Glargine (BASAGLAR KWIKPEN) 100 UNIT/ML SOPN Inject 0.1 mLs (10 Units total) into the skin every morning. And pen needles 1/day (Patient taking differently: Inject 40 Units into the skin at bedtime. ) 15 pen 2  . lidocaine (LIDODERM) 5 % Place 1 patch onto the skin daily. Remove & Discard patch within 12 hours or as directed by MD 30 patch 0  . oxyCODONE (OXY IR/ROXICODONE) 5 MG immediate release tablet Take 5 mg by mouth every 6 (six) hours as needed.     [name] rOPINIRole (REQUIP) 0.25 MG tablet Take 1 tablet (0.25 mg total) by mouth 3 (three) times daily. 30 tablet 0  . tamsulosin (FLOMAX) 0.4 MG CAPS capsule Take 0.4 mg by mouth every morning.     . zolpidem (AMBIEN) 5 MG tablet Take 5 mg by mouth at bedtime as needed for sleep.     No current facility-administered medications on file prior to visit.     Allergies  Allergen Reactions  . Latex Itching, Rash and Other (See Comments)    Pt states she cannot use condoms - cause an infection.  Use of latex on skin is okay.  Tape causes rash  . Sweetening Enhancer [Flavoring Agent] Nausea And Vomiting and Other (See Comments)    HEADACHES  . Tetracyclines & Related Nausea And Vomiting and Other (See Comments)    ;YEAST INFECTIONS  . Aspartame And Phenylalanine Nausea And  Vomiting    HEADACHES  . Ibuprofen Other (See Comments)    HEADACHES  . Trazodone And Nefazodone Other (See Comments)    Hallucinations   . Triazolam Other (See Comments)    HALLUCINATIONS    Objective: There were no vitals filed for this visit.  General: No acute distress, AAOx3  Right foot:  At right medial midfoot ulceration is well healed with no surrounding infection or re-opening, mild swelling right foot, no streaking erythema/cellulitis, no warmth, no drainage, no gross signs of infection noted, Capillary fill time <3  seconds in all digits, gross sensation present via light touch to right foot. Protective sensation absent. 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 No pain or crepitation with range of motion right foot.  No pain with calf compression.   To left foot there is partial thickness ulceration with granular base that measures 1x1x0.3cm larger in size sub met 1 with no active drainage, decreased maceration, scant odor, no other signs of infection. Subjective occasional pain to left foot pain.   Subjective cramping in both legs. No other issues.  Assessment and Plan:  Problem List Items Addressed This Visit      Endocrine   Diabetic polyneuropathy ([place])   Relevant Medications   cyclobenzaprine (FLEXERIL) 10 MG tablet    Other Visit Diagnoses    Foot ulceration, left, limited to breakdown of skin ([place])    -  Primary   Diabetic Charcot's foot ([facility])       Relevant Medications   cyclobenzaprine (FLEXERIL) 10 MG tablet   Left foot pain         -Patient seen and evaluated -Debrided ulceration at left sub met 1 using sterile tissue nipper to the level of the dermis in an excisional fashion to healthy bleeding, applied silvadene cream and offloading pad, recommend continue with the same daily with help of husband and nursing -Rx Levaquin for preventative measures -Will hold off of graft this visit and possibly re-graft next visit if looks better and if pain is better  -Cont with Lidocaine patch as needed for pain. NO NARCOTICS -Refilled flexeril for subjective cramping and restless legs  -Advised patient to continue with custom diabetic shoes with offloading -Advised patient to limit activity to necessity  -Advised again patient to elevate as necessary and to stay off foot as much as possible to prevent worsening of wound  -Go to ER or return to office if worsens  -Return 2 weeks for follow up eval of left foot ulcer possible re-graft.  In the meantime, patient to  call office if any issues or problems arise.  Landis Martins, DPM

## 2017-11-02 ENCOUNTER — Telehealth: Payer: Self-pay | Admitting: Sports Medicine

## 2017-11-02 NOTE — Telephone Encounter (Signed)
This is Marlowe Kays, Therapist, sports with [facility]. I'm trying to find out if she is on an antibiotic for her foot (trying to update her medications after seeing her today). Also, she has silvadene cream and the label says to apply daily. The last requisition orders we have looks like they have really not changed since September and that was for her to only have the dressing change done twice a week. So if somebody could call me back at [phone] and update me on that. Let me know if the frequency has changed and if the frequency use of the silvadene has changed or are we keeping it at twice a week with the dressing change.

## 2017-11-02 NOTE — Telephone Encounter (Signed)
I informed [name] - [facility] 10/31/2017 Plan was for the silvadene dressing and padding to be changed daily with the assistance of the husband and nursing.

## 2017-11-14 ENCOUNTER — Ambulatory Visit (INDEPENDENT_AMBULATORY_CARE_PROVIDER_SITE_OTHER): Payer: Medicare Other | Admitting: Sports Medicine

## 2017-11-14 ENCOUNTER — Encounter: Payer: Self-pay | Admitting: Sports Medicine

## 2017-11-14 DIAGNOSIS — L97521 Non-pressure chronic ulcer of other part of left foot limited to breakdown of skin: Secondary | ICD-10-CM | POA: Diagnosis not present

## 2017-11-14 DIAGNOSIS — R252 Cramp and spasm: Secondary | ICD-10-CM

## 2017-11-14 DIAGNOSIS — E1142 Type 2 diabetes mellitus with diabetic polyneuropathy: Secondary | ICD-10-CM | POA: Diagnosis not present

## 2017-11-14 DIAGNOSIS — E11610 Type 2 diabetes mellitus with diabetic neuropathic arthropathy: Secondary | ICD-10-CM

## 2017-11-14 DIAGNOSIS — M79672 Pain in left foot: Secondary | ICD-10-CM

## 2017-11-14 MED ORDER — CYCLOBENZAPRINE HCL 10 MG PO TABS: 10.0000 mg | ORAL_TABLET | Freq: Three times a day (TID) | ORAL | 0 refills | Status: DC | PRN

## 2017-11-14 NOTE — Progress Notes (Addendum)
Subjective: [name] is a 51 y.o. female patient seen today in office for follow up left foot pain and ulceration on left; EPIFIX Graft #4 placed 2 weeks ago. Denies calf pain, denies headache, chest pain, shortness of breath, nausea, vomiting, fever, or chills. Admits that she has a new scrape on right ankle don't know how she got it and that she is concerned about shoes because they are wore out.Patient states nursing has been coming but they are not happy with how they are wrapping the area. Request Flexeril refill. Denies constitutional symptoms.   FBS elevated, does not recall #   Patient Active Problem List   Diagnosis Date Noted  . Cancer of sigmoid colon ([place]) 03/09/2015  . OSA (obstructive sleep apnea) 07/04/2014  . Migraine without aura, with intractable migraine, so stated, without mention of status migrainosus 03/26/2014  . Peripheral neuropathy 03/03/2014  . Abnormal stress test 02/12/2014  . CVA (cerebral infarction) 02/12/2014  . Chest pain   . Abnormal heart rhythm   . Edema   . Hypertension   . Hyperlipemia   . Hypercholesterolemia   . Other and unspecified hyperlipidemia 11/14/2013  . Type II diabetes mellitus, uncontrolled ([place]) 07/22/2013  . Essential hypertension 07/22/2013  . 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 Diabetic polyneuropathy ([place]) 07/22/2013    Current Outpatient Medications on File Prior to Visit  Medication Sig Dispense Refill  . esomeprazole (NEXIUM) 20 MG capsule Take 20 mg by mouth as needed.    . gabapentin (NEURONTIN) 600 MG tablet Take 1 tablet (600 mg total) by mouth 2 (two) times daily. (Patient taking differently: Take 600 mg by mouth 2 (two) times daily. ) 60 tablet 5  . glucose blood (FREESTYLE LITE) test strip Use to check blood sugars up to 4 times daily. Dx Code E11.65 100 each 3  . insulin aspart (NOVOLOG) 100 UNIT/ML FlexPen Take 10 units before your main meal.  If blood sugars are consistently over 250 after the meal start taking 14 units before your main meal. (Patient taking differently: Inject into the skin every morning. Per pt does not eat breakfast--- pt doesn't eat until the evening ?compliant) 45 mL 2  . Insulin Glargine (BASAGLAR KWIKPEN) 100 UNIT/ML SOPN Inject 0.1 mLs (10 Units total) into the skin every morning. And pen needles 1/day (Patient taking differently: Inject 40 Units into the skin at bedtime. ) 15 pen 2  . INVOKANA 300 MG TABS tablet TAKE 1 TABLET BY MOUTH DAILY BEFORE BREAKFAST 30 tablet 3  . levofloxacin (LEVAQUIN) 500 MG tablet Take 1 tablet (500 mg total) daily by mouth. 14 tablet 0  . lidocaine (LIDODERM) 5 % Place 1 patch onto the skin daily. Remove & Discard patch within 12 hours or as directed by MD 30 patch 0  . oxyCODONE (OXY IR/ROXICODONE) 5 MG immediate release tablet Take 5 mg by mouth every 6 (six) hours as needed.     [name] rOPINIRole (REQUIP) 0.25 MG tablet Take 1 tablet (0.25 mg total) by mouth 3 (three) times daily. 30 tablet 0  . silver sulfADIAZINE (SILVADENE) 1 % cream Apply 1 application daily topically. 50 g 0  . tamsulosin (FLOMAX) 0.4 MG CAPS capsule Take 0.4 mg by mouth every morning.     . zolpidem (AMBIEN) 5 MG tablet Take 5 mg by mouth at bedtime as needed for sleep.     No current facility-administered medications on file prior to visit.  Allergies  Allergen Reactions  . Latex Itching, Rash and Other (See Comments)    Pt states she cannot use condoms - cause an infection.  Use of latex on skin is okay.  Tape causes rash  . Sweetening Enhancer [Flavoring Agent] Nausea And Vomiting and Other (See Comments)    HEADACHES  . Tetracyclines & Related Nausea And Vomiting and Other (See Comments)    ;YEAST INFECTIONS  . Aspartame And Phenylalanine Nausea And Vomiting    HEADACHES  . Ibuprofen Other (See Comments)    HEADACHES  . Trazodone And Nefazodone Other (See Comments)    Hallucinations   .  Triazolam Other (See Comments)    HALLUCINATIONS    Objective: There were no vitals filed for this visit.  General: No acute distress, AAOx3  Right foot:  At right medial midfoot ulceration is well healed with no surrounding infection or re-opening, mild swelling right foot, no streaking erythema/cellulitis,Abrasion to medial ankle, no warmth, no drainage, no gross signs of infection noted, Capillary fill time <3 seconds in all digits, gross sensation present via light touch to right foot. Protective sensation absent. 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 No pain or crepitation with range of motion right foot.  No pain with calf compression.   To left foot there is partial thickness ulceration with granular base that measures 0.5x0.3x0.3cm smaller in size sub met 1 with no active drainage, decreased maceration, no odor, there is a small abrasion to top of the left foot, no other signs of infection. Subjective occasional pain to left foot pain and return of cramping in both legs. No other issues.  Assessment and Plan:  Problem List Items Addressed This Visit      Endocrine   Diabetic polyneuropathy ([place])   Relevant Medications   cyclobenzaprine (FLEXERIL) 10 MG tablet    Other Visit Diagnoses    Foot ulceration, left, limited to breakdown of skin ([place])    -  Primary   Diabetic Charcot's foot ([facility])       Relevant Medications   cyclobenzaprine (FLEXERIL) 10 MG tablet   Left foot pain       Leg cramping         -Patient seen and evaluated -Debrided ulceration at left sub met 1 using sterile tissue nipper to the level of the dermis in an excisional fashion to healthy bleeding. Post debridement measurements as above. Applied Epifix 63m disk, graft #5 to site, re-order # [id number]  Tissue id # [id number] secured with steri-strips covered with adaptic and dry dressing. Advised patient to change outer dressing with help of boyfriend or  nursing 1-2x per week. May hold off on silvadene cream since we have started to reapply the graft. Recommend adaptic to top of foot at abrasion and antibiotic cream and bandaid to right medial ankle abrasion.  -Refilled Flexeril -Dispensed post op shoe since her custom shoes will be sent back for modification/new pair; Patient was seen by RLiliane Channeltoday -Cont with Lidocaine patch as needed for pain. NO NARCOTICS -Advised patient to limit activity to necessity  -Advised again patient to elevate as necessary and to stay off foot as much as possible to prevent worsening of wound  -Go to ER or return to office if worsens.  -Return 2 weeks for follow up eval of left foot ulcer possible re-graft.  In the meantime, patient to call office if any issues or problems arise.  TLandis Martins DPM

## 2017-11-14 NOTE — Progress Notes (Signed)
Ordered Apis Charcot shoe to wear until [employer] custom returns repaired.  Confirmation [id number].  Size 9.5 3E

## 2017-11-15 ENCOUNTER — Telehealth: Payer: Self-pay | Admitting: *Deleted

## 2017-11-15 NOTE — Telephone Encounter (Signed)
Faxed Dr. Leeanne Rio 11/14/2017 4:48pm orders to [facility].

## 2017-11-15 NOTE — Telephone Encounter (Signed)
Emailed order for 6th 77mm EpiFix to be applied 11/28/2017 in [place], with note to inform if additional information was needed for 6th graft.

## 2017-11-15 NOTE — Telephone Encounter (Signed)
-----   Message from Landis Martins, [place] sent at 11/14/2017  8:31 PM EST ----- Regarding: Wound Care Orders To left adaptic, offloading pad, and dry dressing. Advised patient to change outer dressing with help of boyfriend or nursing 1-2x per week as above. Discontinue silvadene cream since we have started to reapply the graft on left. Recommend adaptic to top of foot at abrasion on left and antibiotic cream and bandaid to right medial ankle abrasion.

## 2017-11-15 NOTE — Telephone Encounter (Signed)
-----   Message from Landis Martins, [place] sent at 11/14/2017  4:48 PM EST ----- Regarding: Re-order Epifix Reorder for next visit

## 2017-11-15 NOTE — Telephone Encounter (Signed)
Copy of 11/14/2017 4:48pm orders faxed to [facility].

## 2017-11-22 ENCOUNTER — Telehealth: Payer: Self-pay | Admitting: *Deleted

## 2017-11-22 NOTE — Telephone Encounter (Signed)
Ok I'm having [employer] it back -Dr. Chauncey Cruel

## 2017-11-22 NOTE — Telephone Encounter (Signed)
-----   Message from Landis Martins, [place] sent at 11/21/2017  8:30 AM EST ----- Regarding: Request approval from Medicare for Endoform I talked to vicki and she said we should request approval for Endoform 2x2cm dermal template cpt code F7588  -Dr. Cannon Kettle

## 2017-11-22 NOTE — Telephone Encounter (Addendum)
Faxed required form, clinicals and demographics to [employer] for pre-cert for Hydrogel Restore 4 x 5 30 day for every 3 day dressing changes for left sub 1st metatarsal partial thickness wound measuring 0.5 x 0.3 x 0.3cm without exudate, also requested 1" paper tape x 15.

## 2017-11-28 ENCOUNTER — Ambulatory Visit (INDEPENDENT_AMBULATORY_CARE_PROVIDER_SITE_OTHER): Payer: Medicare Other | Admitting: Sports Medicine

## 2017-11-28 ENCOUNTER — Encounter: Payer: Self-pay | Admitting: Sports Medicine

## 2017-11-28 VITALS — BP 139/84 | HR 100 | Resp 16

## 2017-11-28 DIAGNOSIS — R252 Cramp and spasm: Secondary | ICD-10-CM

## 2017-11-28 DIAGNOSIS — E1142 Type 2 diabetes mellitus with diabetic polyneuropathy: Secondary | ICD-10-CM

## 2017-11-28 DIAGNOSIS — E11610 Type 2 diabetes mellitus with diabetic neuropathic arthropathy: Secondary | ICD-10-CM

## 2017-11-28 DIAGNOSIS — L97521 Non-pressure chronic ulcer of other part of left foot limited to breakdown of skin: Secondary | ICD-10-CM | POA: Diagnosis not present

## 2017-11-28 DIAGNOSIS — M79672 Pain in left foot: Secondary | ICD-10-CM

## 2017-11-28 MED ORDER — CYCLOBENZAPRINE HCL 10 MG PO TABS: 10.0000 mg | ORAL_TABLET | Freq: Three times a day (TID) | ORAL | 2 refills | Status: DC | PRN

## 2017-11-28 NOTE — Progress Notes (Signed)
Subjective: [name] is a 51 y.o. female patient seen today in office for follow up left foot pain and ulceration on left; EPIFIX Graft #5 placed 2 weeks ago. Denies calf pain, denies headache, chest pain, shortness of breath, nausea, vomiting, fever, or chills. Reports pain 101/10 to both feet worse neuropathy pain. Request Flexeril refill, states that it helps. Denies constitutional symptoms.   FBS elevated  Patient Active Problem List   Diagnosis Date Noted  . Cancer of sigmoid colon ([place]) 03/09/2015  . OSA (obstructive sleep apnea) 07/04/2014  . Migraine without aura, with intractable migraine, so stated, without mention of status migrainosus 03/26/2014  . Peripheral neuropathy 03/03/2014  . Abnormal stress test 02/12/2014  . CVA (cerebral infarction) 02/12/2014  . Chest pain   . Abnormal heart rhythm   . Edema   . Hypertension   . Hyperlipemia   . Hypercholesterolemia   . Other and unspecified hyperlipidemia 11/14/2013  . Type II diabetes mellitus, uncontrolled ([place]) 07/22/2013  . Essential hypertension 07/22/2013  . 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 Diabetic polyneuropathy ([place]) 07/22/2013    Current Outpatient Medications on File Prior to Visit  Medication Sig Dispense Refill  . esomeprazole (NEXIUM) 20 MG capsule Take 20 mg by mouth as needed.    . gabapentin (NEURONTIN) 600 MG tablet Take 1 tablet (600 mg total) by mouth 2 (two) times daily. (Patient taking differently: Take 600 mg by mouth 2 (two) times daily. ) 60 tablet 5  . glucose blood (FREESTYLE LITE) test strip Use to check blood sugars up to 4 times daily. Dx Code E11.65 100 each 3  . insulin aspart (NOVOLOG) 100 UNIT/ML FlexPen Take 10 units before your main meal. If blood sugars are consistently over 250 after the meal start taking 14 units before your main meal. (Patient taking differently: Inject into the skin every morning. Per pt does  not eat breakfast--- pt doesn't eat until the evening ?compliant) 45 mL 2  . Insulin Glargine (BASAGLAR KWIKPEN) 100 UNIT/ML SOPN Inject 0.1 mLs (10 Units total) into the skin every morning. And pen needles 1/day (Patient taking differently: Inject 40 Units into the skin at bedtime. ) 15 pen 2  . INVOKANA 300 MG TABS tablet TAKE 1 TABLET BY MOUTH DAILY BEFORE BREAKFAST 30 tablet 3  . levofloxacin (LEVAQUIN) 500 MG tablet Take 1 tablet (500 mg total) daily by mouth. 14 tablet 0  . lidocaine (LIDODERM) 5 % Place 1 patch onto the skin daily. Remove & Discard patch within 12 hours or as directed by MD 30 patch 0  . oxyCODONE (OXY IR/ROXICODONE) 5 MG immediate release tablet Take 5 mg by mouth every 6 (six) hours as needed.     [name] rOPINIRole (REQUIP) 0.25 MG tablet Take 1 tablet (0.25 mg total) by mouth 3 (three) times daily. 30 tablet 0  . silver sulfADIAZINE (SILVADENE) 1 % cream Apply 1 application daily topically. 50 g 0  . tamsulosin (FLOMAX) 0.4 MG CAPS capsule Take 0.4 mg by mouth every morning.     . zolpidem (AMBIEN) 5 MG tablet Take 5 mg by mouth at bedtime as needed for sleep.     No current facility-administered medications on file prior to visit.     Allergies  Allergen Reactions  . Latex Itching, Rash and Other (See Comments)    Pt states she cannot use condoms - cause an infection.  Use of latex on skin is okay.  Tape  causes rash  . Sweetening Enhancer [Flavoring Agent] Nausea And Vomiting and Other (See Comments)    HEADACHES  . Tetracyclines & Related Nausea And Vomiting and Other (See Comments)    ;YEAST INFECTIONS  . Aspartame And Phenylalanine Nausea And Vomiting    HEADACHES  . Ibuprofen Other (See Comments)    HEADACHES  . Trazodone And Nefazodone Other (See Comments)    Hallucinations   . Triazolam Other (See Comments)    HALLUCINATIONS    Objective: There were no vitals filed for this visit.  General: No acute distress, AAOx3  Right foot:  At right medial  midfoot ulceration is well healed with no surrounding infection or re-opening, mild swelling right foot, no streaking erythema/cellulitis,Abrasion to medial ankle, no warmth, no drainage, no gross signs of infection noted, Capillary fill time <3 seconds in all digits, gross sensation present via light touch to right foot. Protective sensation absent. 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 No pain or crepitation with range of motion right foot.  No pain with calf compression.   To left foot there is partial thickness ulceration with granular base that measures 0.5x0.4x0.3cm similar in size sub met 1 with no active drainage, decreased maceration, no odor, there is a small abrasion to top of the left foot, no other signs of infection, healing well. Subjective occasional pain to left foot pain and return of cramping in both legs as previous. No other issues.  Assessment and Plan:  Problem List Items Addressed This Visit      Endocrine   Diabetic polyneuropathy ([place])   Relevant Medications   cyclobenzaprine (FLEXERIL) 10 MG tablet    Other Visit Diagnoses    Foot ulceration, left, limited to breakdown of skin ([place])    -  Primary   Diabetic Charcot's foot ([facility])       Relevant Medications   cyclobenzaprine (FLEXERIL) 10 MG tablet   Left foot pain       Leg cramping       Diabetic Charcot foot ([facility])       Relevant Medications   cyclobenzaprine (FLEXERIL) 10 MG tablet     -Patient seen and evaluated -Debrided ulceration at left sub met 1 using sterile tissue nipper to the level of the dermis in an excisional fashion to healthy bleeding. Post debridement measurements as above. Applied a small piece of endoform 2x2 dermal template to wound secured with steri-strips covered with adaptic and dry dressing. Advised patient to change outer dressing with help of boyfriend or nursing 1-2x per week. May use hydroform and wound gel as ordered by nursing company. Recommend  adaptic to top of foot at abrasion and antibiotic cream and bandaid to right medial ankle abrasion.  -Refilled Flexeril 30 day supply  -Continue with post op shoe since her custom shoes will be sent back for modification/new pair; The charcot temporary shoes were too tight and gave back to [facility] for [employer] with Lidocaine patch as needed for pain. NO NARCOTICS -Advised patient to limit activity to necessity  -Advised again patient to elevate as necessary and to stay off foot as much as possible to prevent worsening of wound  -Go to ER or return to office if worsens.  -Return 2 weeks for follow up eval of left foot ulcer possible reapplication of endoform.  In the meantime, patient to call office if any issues or problems arise.  Landis Martins, DPM

## 2017-12-07 ENCOUNTER — Other Ambulatory Visit: Payer: Self-pay

## 2017-12-11 ENCOUNTER — Encounter: Payer: Self-pay | Admitting: Endocrinology

## 2017-12-11 ENCOUNTER — Ambulatory Visit (INDEPENDENT_AMBULATORY_CARE_PROVIDER_SITE_OTHER): Payer: Medicare Other | Admitting: Endocrinology

## 2017-12-11 VITALS — BP 128/76 | HR 84 | Ht 65.0 in | Wt 186.6 lb

## 2017-12-11 DIAGNOSIS — Z794 Long term (current) use of insulin: Secondary | ICD-10-CM | POA: Diagnosis not present

## 2017-12-11 DIAGNOSIS — E1165 Type 2 diabetes mellitus with hyperglycemia: Secondary | ICD-10-CM | POA: Diagnosis not present

## 2017-12-11 LAB — COMPREHENSIVE METABOLIC PANEL
ALT: 8 U/L (ref 0–35)
AST: 9 U/L (ref 0–37)
Albumin: 3.7 g/dL (ref 3.5–5.2)
Alkaline Phosphatase: 93 U/L (ref 39–117)
BUN: 9 mg/dL (ref 6–23)
CHLORIDE: 96 meq/L (ref 96–112)
CO2: 27 meq/L (ref 19–32)
Calcium: 9.3 mg/dL (ref 8.4–10.5)
Creatinine, Ser: 0.8 mg/dL (ref 0.40–1.20)
GFR: 79.89 mL/min (ref >60.00)
GLUCOSE: 413 mg/dL — AB (ref 70–99)
POTASSIUM: 4 meq/L (ref 3.5–5.1)
SODIUM: 130 meq/L — AB (ref 135–145)
Total Bilirubin: 0.3 mg/dL (ref 0.2–1.2)
Total Protein: 7 g/dL (ref 6.0–8.3)

## 2017-12-11 LAB — POCT GLYCOSYLATED HEMOGLOBIN (HGB A1C): Hemoglobin A1C: 10

## 2017-12-11 MED ORDER — GLYCERIN 35 % MT LIQD: OROMUCOSAL | 1 refills | Status: DC

## 2017-12-11 MED ORDER — BASAGLAR KWIKPEN 100 UNIT/ML ~~LOC~~ SOPN: 36.0000 [IU] | PEN_INJECTOR | Freq: Every day | SUBCUTANEOUS | 2 refills | Status: DC

## 2017-12-11 NOTE — Progress Notes (Signed)
Patient ID: [name], female   DOB: 1966/10/27, 51 y.o.   MRN: [id number]   Reason for Appointment: Diabetes follow-up    History of Present Illness:   Diagnosis: Type 2 diabetes mellitus, date of diagnosis: 2004.  PAST history: She has been treated mostly with insulin since about a year after diagnosis. She has had difficulty with consistent compliance with diet and also compliance with self care including glucose monitoring over the years. She had been mostly treated with basal insulin. Also had been tried on mealtime insulin but she would be noncompliant with this. Was tried on Prandin for mealtime control but difficult to judge efficacy because of lack of postprandial monitoring. Was given Victoza to start in 2011 but did not follow up after this.She was tried on premixed insulin but this did not help her control, mostly because of noncompliance with the doses. She had been using the V- go pump and had better control initially and was better compliant with the daily routine and boluses. She has had frequent education visits also. She stopped using her V.-go pump in 6/15 because of discomfort at the site of application, mostly was using this on her abdomen Also did not improve with a trial of Victoza   RECENT history:    INSULIN dose: Toujeo 30 units in am.  NovoLog 20-25 units up to 3 times a day Non-insulin hypoglycemic drugs: Invokana 300 mg daily  Her most recent A1c is 10%, previously was 8.8  Current management, problems identified:   She has been not brought her blood sugar monitor for review again  Her boyfriend thinks her blood sugars were relatively better with combination of Basaglar and NovoLog but still above target  Her new PCP gave her a sample of Toujeo which she has taken for over a week but her blood sugars appear to be higher throughout the day with this compared to [place]  She was supposed to take 35 units of Basaglar but was taking only 30 once  a day  She continues to drink large amounts of sweet tea and regular soft drinks, she thinks that she is thirsty all the time and has a very dry mouth.  Usually not drinking sweet drinks during the night  Her weight appears to have gone up even though she thinks she is taking her Invokana regularly  She thinks she is taking her blood sugar 2 or 3 times a day and will take at least 20 units of Novolog if the blood sugar is high and up to 3 times a day   Glucometer:  FreeStyle.   Blood Glucose readings by recall:     Mean values apply above for all meters except median for One Touch  PRE-MEAL Fasting Lunch Dinner Bedtime Overall  Glucose range: 150-275  300 250   Mean/median:         Hypoglycemia frequency: Never.    Food preferences: eating Mostly 1 meal per day around 5-8 pm, variable intake, sandwiches at times or otherwise snacks, still getting regular drinks and sweet tea  Physical activity: exercise: Minimal.  She has difficulties with leg pain and weakness  Certified Diabetes Educator visit: Most recent:, 2/14.     Wt Readings from Last 3 Encounters:  12/11/17 186 lb 9.6 oz (84.6 kg)  10/19/17 181 lb 8 oz (82.3 kg)  10/09/17 175 lb (79.4 kg)   DM labs:   Lab Results  Component Value Date   HGBA1C 8.8 08/08/2017   HGBA1C >  15 05/24/2017   HGBA1C 10.1 12/28/2016   Lab Results  Component Value Date   MICROALBUR 0.9 10/09/2017   LDLCALC 41 08/08/2017   CREATININE 0.60 10/19/2017       Other active problems: See review of systems    Allergies as of 12/11/2017      Reactions   Latex Itching, Rash, Other (See Comments)   Pt states she cannot use condoms - cause an infection.  Use of latex on skin is okay.  Tape causes rash   Sweetening Enhancer [flavoring Agent] Nausea And Vomiting, Other (See Comments)   HEADACHES   Tetracyclines & Related Nausea And Vomiting, Other (See Comments)   ;YEAST INFECTIONS   Aspartame And Phenylalanine Nausea And Vomiting    HEADACHES   Ibuprofen Other (See Comments)   HEADACHES   Trazodone And Nefazodone Other (See Comments)   Hallucinations   Triazolam Other (See Comments)   HALLUCINATIONS      Medication List        Accurate as of 12/11/17  4:29 PM. Always use your most recent med list.          BASAGLAR KWIKPEN 100 UNIT/ML Sopn Inject 0.1 mLs (10 Units total) into the skin every morning. And pen needles 1/day   cyclobenzaprine 10 MG tablet Commonly known as:  FLEXERIL Take 1 tablet (10 mg total) by mouth 3 (three) times daily as needed for muscle spasms.   esomeprazole 20 MG capsule Commonly known as:  NEXIUM Take 20 mg by mouth as needed.   gabapentin 600 MG tablet Commonly known as:  NEURONTIN Take 1 tablet (600 mg total) by mouth 2 (two) times daily.   glucose blood test strip Commonly known as:  FREESTYLE LITE Use to check blood sugars up to 4 times daily. Dx Code E11.65   insulin aspart 100 UNIT/ML FlexPen Commonly known as:  NOVOLOG Take 10 units before your main meal. If blood sugars are consistently over 250 after the meal start taking 14 units before your main meal.   INVOKANA 300 MG Tabs tablet Generic drug:  canagliflozin TAKE 1 TABLET BY MOUTH DAILY BEFORE BREAKFAST   lidocaine 5 % Commonly known as:  LIDODERM Place 1 patch onto the skin daily. Remove & Discard patch within 12 hours or as directed by MD   oxyCODONE 5 MG immediate release tablet Commonly known as:  Oxy IR/ROXICODONE Take 5 mg by mouth every 6 (six) hours as needed.   rOPINIRole 0.25 MG tablet Commonly known as:  REQUIP Take 1 tablet (0.25 mg total) by mouth 3 (three) times daily.   silver sulfADIAZINE 1 % cream Commonly known as:  SILVADENE Apply 1 application daily topically.   tamsulosin 0.4 MG Caps capsule Commonly known as:  FLOMAX Take 0.4 mg by mouth every morning.   zolpidem 5 MG tablet Commonly known as:  AMBIEN Take 5 mg by mouth at bedtime as needed for sleep.        Allergies:  Allergies  Allergen Reactions  . Latex Itching, Rash and Other (See Comments)    Pt states she cannot use condoms - cause an infection.  Use of latex on skin is okay.  Tape causes rash  . Sweetening Enhancer [Flavoring Agent] Nausea And Vomiting and Other (See Comments)    HEADACHES  . Tetracyclines & Related Nausea And Vomiting and Other (See Comments)    ;YEAST INFECTIONS  . Aspartame And Phenylalanine Nausea And Vomiting    HEADACHES  . Ibuprofen Other (See Comments)  HEADACHES  . Trazodone And Nefazodone Other (See Comments)    Hallucinations   . Triazolam Other (See Comments)    HALLUCINATIONS    Past Medical History:  Diagnosis Date  . Anxiety   . Arthritis   . Bipolar disorder ([place])   . Chronic back pain   . Claudication ([place])    a. 12/2013 ABI's: R 0.97, L 0.94.  [name] COPD (chronic obstructive pulmonary disease) ([place])   . Depression   . Diabetic Charcot's foot ([place])   . Diabetic foot ulcer ([facility])    chronic left foot ulcer , great toe/  hx recurrent foot ulcer bilaterally  . DJD (degenerative joint disease)   . Edema of both lower extremities   . GERD (gastroesophageal reflux disease)   . Headache(784.0)   . Hiatal hernia   . History of colon cancer, stage I dx 02-26-2015--- oncologist-- dr [name]--- per last note no recurrence   05-16-2015 s/p  Laparoscopic sigmoid colectomy w/ node bx's (negative nodes per path)  Stage I (pT1,N0,M0) Grade 2  . 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   per MRI multiple small strokes post cardiac cath. --  residual mild memory loss  . History of methicillin resistant staphylococcus aureus (MRSA)   . Hypercholesterolemia   . Insomnia   . Insulin dependent type 2 diabetes mellitus, uncontrolled ([place]) dx 2004   endocrinologist-  dr Dwyane Dee-- last A1c 8.8 in Aug2018:  pt is noncompliant w/ diet, states does note eat breakfast , her first main meal in afternoon  . Neurogenic bladder   .  Neuropathy, diabetic ([place])    hands and feet  . OSA (obstructive sleep apnea)    cpap intolerant  . Personality disorder ([place])   . SOB (shortness of breath) on exertion   . SUI (stress urinary incontinence, female) S/P SLING 12-29-2011    Past Surgical History:  Procedure Laterality Date  . ANTERIOR CERVICAL DECOMP/DISCECTOMY FUSION  2000   C5 - 7  . CARDIAC CATHETERIZATION  05-22-2008   DR SKAINS   NO SIGNIFECANT CAD/ NORMAL LV/  EF 65%/  NO WALL MOTION ABNORMALITIES  . CARPAL TUNNEL RELEASE Right 04-25-2013  . [place]; 1992  . CYSTO N/A 04/30/2013   Procedure: CYSTOSCOPY;  Surgeon: Reece Packer, MD;  Location: [facility];  Service: Urology;  Laterality: N/A;  . CYSTOSCOPY WITH INJECTION  05/04/2012   Procedure: CYSTOSCOPY WITH INJECTION;  Surgeon: Reece Packer, MD;  Location: [facility];  Service: Urology;  Laterality: N/A;  MACROPLASTIQUE INJECTION  . CYSTOSCOPY WITH INJECTION  08/28/2012   Procedure: CYSTOSCOPY WITH INJECTION;  Surgeon: Reece Packer, MD;  Location: [facility];  Service: Urology;  Laterality: N/A;  cysto and macroplastique   . FOOT SURGERY Bilateral   . HERNIA REPAIR  ?1996   "stomach"  . KNEE ARTHROSCOPY W/ ALLOGRAFT IMPANT Left    graft x 2  . KNEE SURGERY     TOTAL 8 SURG'S  . LAPAROSCOPIC SIGMOID COLECTOMY N/A 04/22/2015   Procedure: LAPAROSCOPIC HAND ASSISTED SIGMOID COLECTOMY;  Surgeon: Erroll Luna, MD;  Location: [facility];  Service: General;  Laterality: N/A;  . LEFT HEART CATHETERIZATION WITH CORONARY ANGIOGRAM N/A 02/12/2014   Procedure: LEFT HEART CATHETERIZATION WITH CORONARY ANGIOGRAM;  Surgeon: Candee Furbish, MD;  Location: [facility];  Service: Cardiovascular;  Laterality: N/A;   No angiographically significant CAD; normal LVSF, LVEDP 62mmHg,  EF 55% (new finding ef 30%  myoview 01-08-2014)  . LUMBAR FUSION    . MULTIPLE LAPAROSCOPIES FOR ENDOMETRIOSIS    . PUBOVAGINAL SLING  12/29/2011   Procedure:  Gaynelle Arabian;  Surgeon: Reece Packer, MD;  [facility];  Service: Urology;  Laterality: N/A;  cysto and sparc sling   . PUBOVAGINAL SLING N/A 04/30/2013   Procedure: REMOVAL OF VAGINAL MESH;  Surgeon: Reece Packer, MD;  Location: [facility];  Service: Urology;  Laterality: N/A;  . RECONSTURCTION OF CONGENITAL UTERUS ANOMALY  1983  . REPEAT RECONSTRUCTION ACL LEFT KNEE/ SCREWS REMOVED  03-28-2000   CADAVER GRAFT  . TOTAL ABDOMINAL HYSTERECTOMY W/ BILATERAL SALPINGOOPHORECTOMY  1997  . TRANSTHORACIC ECHOCARDIOGRAM  02/13/2014   ef 45%, hypokinesis base inferior and base inferolateral walls  . WOUND DEBRIDEMENT Right 09/05/2016   Procedure: DEBRIDEMENT WOUND WITH GRAFT RIGHT FOOT;  Surgeon: Landis Martins, DPM;  Location: [place];  Service: Podiatry;  Laterality: Right;    Family History  Problem Relation Age of Onset  . Hypertension Mother   . Diabetes Mother   . Cancer - Other Mother        lymphoma   . Cancer - Other Father        lung, bladder cancer   . Heart attack Father   . Cancer - Other Brother        bladder cancer     Social History:  reports that she has been smoking cigarettes.  She has a 90.00 pack-year smoking history. she has never used smokeless tobacco. She reports that she does not drink alcohol or use drugs.  Review of Systems -    She has history of high triglycerides previously treated with fenofibrate and is not on any treatment and lately her lipids have been good   Lab Results  Component Value Date   CHOL 103 08/08/2017   HDL 38.00 (L) 08/08/2017   LDLCALC 41 08/08/2017   LDLDIRECT 46.0 12/28/2016   TRIG 118.0 08/08/2017   CHOLHDL 3 08/08/2017     HYPOKALEMIA/hyponatremia:  Her last potassium was normal but her sodium was low at 129 on her last visit, better subsequently  Also having issues with leg cramps previously but magnesium was normal  Lab Results  Component Value Date   CREATININE 0.60 10/19/2017    BUN 6 10/19/2017   NA 134 (L) 10/19/2017   K 4.2 10/19/2017   CL 96 (L) 10/19/2017   CO2 28 10/09/2017    Painful neuropathy: She has had persistent symptoms including leg pains and numbness as well as difficulty with balance  Previously has tried various drugs including gabapentin without much relief   Foot exam Last showed absent pedal pulses and Absent distal sensation  Followed by podiatrist in December 2018 for foot ulcers, recently healing    Examination:   BP 128/76   Pulse 84   Ht 5\' 5"  (1.651 m)   Wt 186 lb 9.6 oz (84.6 kg)   SpO2 98%   BMI 31.05 kg/m   Body mass index is 31.05 kg/m.      Assesment/Plan:   Diabetes type 2, with persistently poor control, insulin requiring  See history of present illness for detailed discussion of current diabetes management, blood sugar patterns and problems identified  A1c is now 10% and higher than before  A large part of her hyperglycemia related to drinking large amounts of regular sweet drinks which she says is because of her dry mouth Also difficult to know what her blood  sugar patterns are she did not bring her monitor for download consistently Reportedly has higher readings with taking Toujeo compared to [employer] but most likely has not had enough basal insulin anyway  Her management is difficult because of lack of understanding how to manage her diabetes and her fianc is adjusting her insulin randomly without any basis   She also is interested in looking at the Omnipod pump as an option and showed her how this would work as well as infusion sites and type of insulin used and mealtime coverage, discussed advantages over multiple injections  Specific instructions for monitoring and insulin as well as Invokana were reviewed in detail with the patient and her boyfriend  She will again take Basaglar once a day in the morning  Her boyfriend was shown a flowsheet to use to titrate the dose every 3 days by 2 units starting  with 36 units tomorrow  Stop Toujeo  Given specific instructions on taking Novolog both for meals and high sugars as well as every time she has a significant amount of drinks with sugar content   If covered May have her try artificial saliva to reduce her thirst and dry mouth so that she does not overuse sweet drinks  Blood sugars to be checked to 3 times a day at various times and discussed blood sugar targets  To bring monitor on each visit   History of electrolyte abnormalities: Will need follow-up labs today     Patient Instructions  Basaglar 36 units to start with  Increase or decrease the dose by 2 units every 3 days if the blood sugar is not between about 100-150  For blood sugars over 150 consistently go up 2 units every 3 days  If blood sugars are about 100 or less in the morning then you can reduce by 4 units  NOVOLOG: Take 20 units for the main meal Take 5 units when drinking large amount of regular soft drinks or large amounts of sweet tea  HIGH blood sugar correction:  Blood sugar 200: Take 5 units extra 250 or more: Extra 10 units 300+: 15 units extra   Counseling time on subjects discussed in assessment and plan sections is over 50% of today's 25 minute visit      Elayne Snare 12/11/2017, 4:29 PM    Addendum: Glucose 413, sodium 130 as expected                                                6

## 2017-12-11 NOTE — Patient Instructions (Addendum)
Basaglar 36 units to start with  Increase or decrease the dose by 2 units every 3 days if the blood sugar is not between about 100-150  For blood sugars over 150 consistently go up 2 units every 3 days  If blood sugars are about 100 or less in the morning then you can reduce by 4 units  NOVOLOG: Take 20 units for the main meal Take 5 units when drinking large amount of regular soft drinks or large amounts of sweet tea  HIGH blood sugar correction:  Blood sugar 200: Take 5 units extra 250 or more: Extra 10 units 300+: 15 units extra

## 2017-12-12 ENCOUNTER — Ambulatory Visit: Payer: Medicare Other | Admitting: Sports Medicine

## 2017-12-12 ENCOUNTER — Ambulatory Visit (INDEPENDENT_AMBULATORY_CARE_PROVIDER_SITE_OTHER): Payer: Medicare Other | Admitting: Psychology

## 2017-12-12 ENCOUNTER — Encounter: Payer: Self-pay | Admitting: Psychology

## 2017-12-12 DIAGNOSIS — R413 Other amnesia: Secondary | ICD-10-CM | POA: Diagnosis not present

## 2017-12-12 DIAGNOSIS — F319 Bipolar disorder, unspecified: Secondary | ICD-10-CM
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
## 2017-12-13 ENCOUNTER — Ambulatory Visit (INDEPENDENT_AMBULATORY_CARE_PROVIDER_SITE_OTHER): Payer: Medicare Other | Admitting: Psychology

## 2017-12-13 DIAGNOSIS — R413 Other amnesia: Secondary | ICD-10-CM | POA: Diagnosis not present

## 2017-12-13 NOTE — Progress Notes (Signed)
   Neuropsychology Note  [name] returned today for 2 hours of neuropsychological testing with technician, Milana Kidney, BS, under the supervision of Dr. Macarthur Critchley. The patient did not appear overtly distressed by the testing session, but was extremely lethargic. Rest breaks were offered. [name] will return within 2 weeks for a feedback session with Dr. Si Raider at which time her test performances, clinical impressions and treatment recommendations will be reviewed in detail. The patient understands she can contact our office should she require our assistance before this time.  Full report to follow.

## 2017-12-14 ENCOUNTER — Encounter: Payer: Self-pay | Admitting: Psychology
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
## 2017-12-21 ENCOUNTER — Encounter: Payer: Medicare Other | Admitting: Psychology

## 2017-12-27 ENCOUNTER — Ambulatory Visit (INDEPENDENT_AMBULATORY_CARE_PROVIDER_SITE_OTHER): Payer: Medicare Other | Admitting: Sports Medicine

## 2017-12-27 ENCOUNTER — Encounter: Payer: Self-pay | Admitting: Sports Medicine

## 2017-12-27 DIAGNOSIS — L97521 Non-pressure chronic ulcer of other part of left foot limited to breakdown of skin: Secondary | ICD-10-CM | POA: Diagnosis not present

## 2017-12-27 DIAGNOSIS — M25572 Pain in left ankle and joints of left foot: Secondary | ICD-10-CM

## 2017-12-27 DIAGNOSIS — E11610 Type 2 diabetes mellitus with diabetic neuropathic arthropathy: Secondary | ICD-10-CM

## 2017-12-27 DIAGNOSIS — E1142 Type 2 diabetes mellitus with diabetic polyneuropathy: Secondary | ICD-10-CM

## 2017-12-27 DIAGNOSIS — M79672 Pain in left foot: Secondary | ICD-10-CM

## 2017-12-27 MED ORDER — CYCLOBENZAPRINE HCL 10 MG PO TABS: 10.0000 mg | ORAL_TABLET | Freq: Three times a day (TID) | ORAL | 2 refills | Status: DC | PRN

## 2017-12-27 NOTE — Progress Notes (Signed)
Subjective: [name] is a 52 y.o. female patient seen today in office for follow up left foot pain and ulceration on left; EPIFIX Graft #5 placed 6 weeks ago. Denies calf pain, denies headache, chest pain, shortness of breath, nausea, vomiting, fever, or chills. Reports pain at left ankle and where foot turns out. Request Flexeril refill, states that it helps. Husband reports that home nursing is not coming like they should and has not seen a nurse in 2 weeks. Denies constitutional symptoms.   FBS uncontrolled  Patient Active Problem List   Diagnosis Date Noted  . Cancer of sigmoid colon ([place]) 03/09/2015  . OSA (obstructive sleep apnea) 07/04/2014  . Migraine without aura, with intractable migraine, so stated, without mention of status migrainosus 03/26/2014  . Peripheral neuropathy 03/03/2014  . Abnormal stress test 02/12/2014  . CVA (cerebral infarction) 02/12/2014  . Chest pain   . Abnormal heart rhythm   . Edema   . Hypertension   . Hyperlipemia   . Hypercholesterolemia   . Other and unspecified hyperlipidemia 11/14/2013  . Type II diabetes mellitus, uncontrolled ([place]) 07/22/2013  . Essential hypertension 07/22/2013  . 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 Diabetic polyneuropathy ([place]) 07/22/2013    Current Outpatient Medications on File Prior to Visit  Medication Sig Dispense Refill  . esomeprazole (NEXIUM) 20 MG capsule Take 20 mg by mouth as needed.    . gabapentin (NEURONTIN) 600 MG tablet Take 1 tablet (600 mg total) by mouth 2 (two) times daily. (Patient taking differently: Take 600 mg by mouth 2 (two) times daily. ) 60 tablet 5  . glucose blood (FREESTYLE LITE) test strip Use to check blood sugars up to 4 times daily. Dx Code E11.65 100 each 3  . Glycerin 35 % LIQD Use 1 teaspoon twice a day to relieve dry mouth 60 mL 1  . insulin aspart (NOVOLOG) 100 UNIT/ML FlexPen Take 10 units before your main  meal. If blood sugars are consistently over 250 after the meal start taking 14 units before your main meal. (Patient taking differently: Inject into the skin every morning. Per pt does not eat breakfast--- pt doesn't eat until the evening ?compliant) 45 mL 2  . Insulin Glargine (BASAGLAR KWIKPEN) 100 UNIT/ML SOPN Inject 0.36 mLs (36 Units total) into the skin daily before breakfast. And pen needles 1/day 5 pen 2  . INVOKANA 300 MG TABS tablet TAKE 1 TABLET BY MOUTH DAILY BEFORE BREAKFAST 30 tablet 3  . lidocaine (LIDODERM) 5 % Place 1 patch onto the skin daily. Remove & Discard patch within 12 hours or as directed by MD 30 patch 0  . oxyCODONE (OXY IR/ROXICODONE) 5 MG immediate release tablet Take 5 mg by mouth every 6 (six) hours as needed.     [name] rOPINIRole (REQUIP) 0.25 MG tablet Take 1 tablet (0.25 mg total) by mouth 3 (three) times daily. 30 tablet 0  . silver sulfADIAZINE (SILVADENE) 1 % cream Apply 1 application daily topically. 50 g 0  . tamsulosin (FLOMAX) 0.4 MG CAPS capsule Take 0.4 mg by mouth every morning.     . zolpidem (AMBIEN) 5 MG tablet Take 5 mg by mouth at bedtime as needed for sleep.     No current facility-administered medications on file prior to visit.     Allergies  Allergen Reactions  . Latex Itching, Rash and Other (See Comments)    Pt states she cannot use condoms - cause an infection.  Use of latex on skin is okay.  Tape causes rash  . Sweetening Enhancer [Flavoring Agent] Nausea And Vomiting and Other (See Comments)    HEADACHES  . Tetracyclines & Related Nausea And Vomiting and Other (See Comments)    ;YEAST INFECTIONS  . Aspartame And Phenylalanine Nausea And Vomiting    HEADACHES  . Ibuprofen Other (See Comments)    HEADACHES  . Trazodone And Nefazodone Other (See Comments)    Hallucinations   . Triazolam Other (See Comments)    HALLUCINATIONS    Objective: There were no vitals filed for this visit.  General: No acute distress, AAOx3  Right foot:   At right medial midfoot ulceration is well healed with no surrounding infection or re-opening, mild swelling right foot, no streaking erythema/cellulitis, Capillary fill time <3 seconds in all digits, gross sensation present via light touch to right foot. Protective sensation absent. 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 No pain or crepitation with range of motion right foot.  No pain with calf compression.   To left foot there is partial thickness ulceration with granular base that measures 0.5x0.5x0.1cm similar in size sub met 1 with no active drainage, decreased maceration, no odor, no other signs of infection, Subjective occasional pain to left foot and at sides of both ankles especially when foot turns out.  Subjectve cramping in both legs as previous. No other issues.  Assessment and Plan:  Problem List Items Addressed This Visit      Endocrine   Diabetic polyneuropathy ([place])   Relevant Medications   cyclobenzaprine (FLEXERIL) 10 MG tablet    Other Visit Diagnoses    Foot ulceration, left, limited to breakdown of skin ([place])    -  Primary   Diabetic Charcot's foot ([facility])       Relevant Medications   cyclobenzaprine (FLEXERIL) 10 MG tablet   Left foot pain       Left ankle pain, unspecified chronicity         -Patient seen and evaluated -Debrided ulceration at left sub met 1 using sterile tissue nipper to the level of the dermis in an excisional fashion to healthy bleeding. Post debridement measurements as above. Applied a small piece of PRISMA AG and dry dressing. Advised nursing to change dressing 2x per week -Applied unna boot to left foot and ankle to keep in place for 5 days.  -Refilled Flexeril 30 day supply  -Continue with post op shoe since her custom shoes will be sent back for modification/new pair.  -Cont with Lidocaine patch as needed for pain. NO NARCOTICS as previous noted -Advised patient to limit activity to necessity and Advised  again patient to elevate as necessary and to stay off foot as much as possible to prevent worsening of wound  -Go to ER or return to office if worsens.  -Return 2 weeks for follow up eval of left foot ulcer. If ankle pain continues may get CT scan or Repeat MRI for further eval.  In the meantime, patient to call office if any issues or problems arise.  Landis Martins, DPM

## 2017-12-28 ENCOUNTER — Encounter: Payer: Self-pay | Admitting: Psychology

## 2017-12-28 ENCOUNTER — Telehealth: Payer: Self-pay | Admitting: *Deleted

## 2017-12-28 ENCOUNTER — Ambulatory Visit (INDEPENDENT_AMBULATORY_CARE_PROVIDER_SITE_OTHER): Payer: Medicare Other | Admitting: Psychology

## 2017-12-28 DIAGNOSIS — F319 Bipolar disorder, unspecified: Secondary | ICD-10-CM

## 2017-12-28 DIAGNOSIS — R413 Other amnesia: Secondary | ICD-10-CM

## 2017-12-28 NOTE — Telephone Encounter (Signed)
Faxed copy of Dr. Leeanne Rio 12/27/2017 11:03am orders to [facility].

## 2017-12-28 NOTE — Telephone Encounter (Signed)
-----   Message from Landis Martins, [place] sent at 12/27/2017 11:03 PM EST ----- Regarding: Updated Wound Care orders Please change dressing 2x per week. Cleanse ulceration on left foot and apply PRISMA covered with dry dressing. Keep Unna boot intact for 5 days. May remove and cleanse lower foot and leg with saline and continue to dress foot ulceration with PRISMA as above  Thanks Dr.S

## 2018-01-01 ENCOUNTER — Other Ambulatory Visit: Payer: Self-pay | Admitting: Endocrinology

## 2018-01-09 ENCOUNTER — Ambulatory Visit: Payer: Medicare Other | Admitting: Orthotics

## 2018-01-09 ENCOUNTER — Encounter: Payer: Self-pay | Admitting: Sports Medicine

## 2018-01-09 ENCOUNTER — Ambulatory Visit (INDEPENDENT_AMBULATORY_CARE_PROVIDER_SITE_OTHER): Payer: Medicare Other | Admitting: Sports Medicine

## 2018-01-09 DIAGNOSIS — M25572 Pain in left ankle and joints of left foot: Secondary | ICD-10-CM | POA: Diagnosis not present

## 2018-01-09 DIAGNOSIS — L97521 Non-pressure chronic ulcer of other part of left foot limited to breakdown of skin: Secondary | ICD-10-CM

## 2018-01-09 DIAGNOSIS — M79672 Pain in left foot: Secondary | ICD-10-CM

## 2018-01-09 DIAGNOSIS — L97411 Non-pressure chronic ulcer of right heel and midfoot limited to breakdown of skin: Secondary | ICD-10-CM

## 2018-01-09 DIAGNOSIS — E11610 Type 2 diabetes mellitus with diabetic neuropathic arthropathy: Secondary | ICD-10-CM | POA: Diagnosis not present

## 2018-01-09 DIAGNOSIS — E1142 Type 2 diabetes mellitus with diabetic polyneuropathy: Secondary | ICD-10-CM | POA: Diagnosis not present

## 2018-01-09 DIAGNOSIS — E11621 Type 2 diabetes mellitus with foot ulcer: Secondary | ICD-10-CM

## 2018-01-09 NOTE — Progress Notes (Signed)
Subjective: [name] is a 52 y.o. female patient seen today in office for follow up left foot pain and ulceration on left. Reports pain at left ankle and when walking.  Husband reports that she is not resting her foot and ankle.   FBS uncontrolled  Patient Active Problem List   Diagnosis Date Noted  . Cancer of sigmoid colon ([place]) 03/09/2015  . OSA (obstructive sleep apnea) 07/04/2014  . Migraine without aura, with intractable migraine, so stated, without mention of status migrainosus 03/26/2014  . Peripheral neuropathy 03/03/2014  . Abnormal stress test 02/12/2014  . CVA (cerebral infarction) 02/12/2014  . Chest pain   . Abnormal heart rhythm   . Edema   . Hypertension   . Hyperlipemia   . Hypercholesterolemia   . Other and unspecified hyperlipidemia 11/14/2013  . Type II diabetes mellitus, uncontrolled ([place]) 07/22/2013  . Essential hypertension 07/22/2013  . 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 Diabetic polyneuropathy ([place]) 07/22/2013    Current Outpatient Medications on File Prior to Visit  Medication Sig Dispense Refill  . cyclobenzaprine (FLEXERIL) 10 MG tablet Take 1 tablet (10 mg total) by mouth 3 (three) times daily as needed for muscle spasms. 90 tablet 2  . esomeprazole (NEXIUM) 20 MG capsule Take 20 mg by mouth as needed.    . gabapentin (NEURONTIN) 600 MG tablet Take 1 tablet (600 mg total) by mouth 2 (two) times daily. (Patient taking differently: Take 600 mg by mouth 2 (two) times daily. ) 60 tablet 5  . glucose blood (FREESTYLE LITE) test strip Use to check blood sugars up to 4 times daily. Dx Code E11.65 100 each 3  . Glycerin 35 % LIQD Use 1 teaspoon twice a day to relieve dry mouth 60 mL 1  . insulin aspart (NOVOLOG) 100 UNIT/ML FlexPen Take 10 units before your main meal. If blood sugars are consistently over 250 after the meal start taking 14 units before your main meal. (Patient taking  differently: Inject into the skin every morning. Per pt does not eat breakfast--- pt doesn't eat until the evening ?compliant) 45 mL 2  . Insulin Glargine (BASAGLAR KWIKPEN) 100 UNIT/ML SOPN Inject 0.36 mLs (36 Units total) into the skin daily before breakfast. And pen needles 1/day 5 pen 2  . Insulin Glargine (BASAGLAR KWIKPEN) 100 UNIT/ML SOPN INJECT 0.1 MLS (10 UNITS TOTAL) INTO THE SKIN EVERY MORNING. AND PEN NEEDLES 1/DAY 15 pen 2  . INVOKANA 300 MG TABS tablet TAKE 1 TABLET BY MOUTH DAILY BEFORE BREAKFAST 30 tablet 3  . lidocaine (LIDODERM) 5 % Place 1 patch onto the skin daily. Remove & Discard patch within 12 hours or as directed by MD 30 patch 0  . oxyCODONE (OXY IR/ROXICODONE) 5 MG immediate release tablet Take 5 mg by mouth every 6 (six) hours as needed.     [name] rOPINIRole (REQUIP) 0.25 MG tablet Take 1 tablet (0.25 mg total) by mouth 3 (three) times daily. 30 tablet 0  . silver sulfADIAZINE (SILVADENE) 1 % cream Apply 1 application daily topically. 50 g 0  . tamsulosin (FLOMAX) 0.4 MG CAPS capsule Take 0.4 mg by mouth every morning.     . zolpidem (AMBIEN) 5 MG tablet Take 5 mg by mouth at bedtime as needed for sleep.     No current facility-administered medications on file prior to visit.     Allergies  Allergen Reactions  . Latex Itching, Rash and Other (See Comments)  Pt states she cannot use condoms - cause an infection.  Use of latex on skin is okay.  Tape causes rash  . Sweetening Enhancer [Flavoring Agent] Nausea And Vomiting and Other (See Comments)    HEADACHES  . Tetracyclines & Related Nausea And Vomiting and Other (See Comments)    ;YEAST INFECTIONS  . Aspartame And Phenylalanine Nausea And Vomiting    HEADACHES  . Ibuprofen Other (See Comments)    HEADACHES  . Trazodone And Nefazodone Other (See Comments)    Hallucinations   . Triazolam Other (See Comments)    HALLUCINATIONS    Objective: There were no vitals filed for this visit.  General: No acute  distress, AAOx3  Right foot:  At right medial midfoot ulceration is well healed with no surrounding infection or re-opening, mild swelling right foot, no streaking erythema/cellulitis, Capillary fill time <3 seconds in all digits, gross sensation present via light touch to right foot. Protective sensation absent. 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 No pain or crepitation with range of motion right foot.  No pain with calf compression.   To left foot there is partial thickness ulceration with granular base that measures 0.5x0.5x0.1cm similar in size sub met 1 with no active drainage, decreased maceration, no odor, no other signs of infection, Subjective occasional pain to left foot and at both sides of ankle.   Assessment and Plan:  Problem List Items Addressed This Visit      Endocrine   Diabetic polyneuropathy ([place])    Other Visit Diagnoses    Foot ulceration, left, limited to breakdown of skin ([place])    -  Primary   Diabetic Charcot's foot ([place])       Left foot pain       Left ankle pain, unspecified chronicity         -Patient seen and evaluated -Debrided ulceration at left sub met 1 using sterile tissue nipper to the level of the dermis in an excisional fashion to healthy bleeding. Post debridement measurements as above. Applied a medihoney and dry dressing. Advised nursing to change dressing 3x per week consisting of the same  -Applied unna boot to left foot and ankle to keep in place for 5 days.  -Continue with Flexeril  -Continue with post op shoes for the next 5 days -Patient to take custom shoes to [facility] for modification and we will also request with [employer] new custom shoes from insurance  -Advised patient again to limit activity to necessity and Advised again patient to elevate as necessary and to stay off foot as much as possible to prevent worsening of wound and ankle pain  -Go to ER or return to office if worsens.  -Return 2 weeks for  follow up eval of left foot ulcer. If ankle pain continues may get CT scan or Repeat MRI for further eval.  In the meantime, patient to call office if any issues or problems arise.  Landis Martins, DPM

## 2018-01-09 NOTE — Progress Notes (Signed)
Putting in paperwork for new CUSTOM diabetic shoes from [employer]..Same specs except leather, as neoprem is not durable.

## 2018-01-10 ENCOUNTER — Telehealth: Payer: Self-pay | Admitting: *Deleted

## 2018-01-10 NOTE — Telephone Encounter (Signed)
Faxed copy of 01/09/2018 5:01pm orders to [facility].

## 2018-01-10 NOTE — Telephone Encounter (Signed)
-----  Message from Landis Martins, [place] sent at 01/09/2018  5:01 PM EST ----- Regarding: Home nursing wound care update Medi-honey to sub met 1 ulceration make sure offloading padding is surrounding the area covered with dry dressing 3x per week. Patient has unna boot on her foot that she will keep in place for 5 days and then remove for her ankle pain on the left.

## 2018-01-15 ENCOUNTER — Telehealth: Payer: Self-pay | Admitting: Sports Medicine

## 2018-01-15 NOTE — Telephone Encounter (Signed)
This is Marlowe Kays, Therapist, sports with [facility]. My number is [phone] or you can fax our office with any new orders in regards to [employer]. Needed to clarify if the caregiver is able to do one of those three time a week dressing changes one or even two. He had been doing it previously and for some reason told us that we needed to do them all. I don't see the need for that and that frequency is high to [place]. If you would please call me back with clarification or fax it to the agency at [phone]. Thank you. Bye bye.

## 2018-01-15 NOTE — Telephone Encounter (Signed)
Nursing to do it twice and the husband to do it once per week -Dr. Chauncey Cruel

## 2018-01-15 NOTE — Telephone Encounter (Signed)
I informed Marlowe Kays, RN - [facility] Dr. Cannon Kettle wanted [facility] to perform the wound care 2 x week and the husband once weekly.

## 2018-01-17 ENCOUNTER — Ambulatory Visit (INDEPENDENT_AMBULATORY_CARE_PROVIDER_SITE_OTHER): Payer: Medicare Other | Admitting: Endocrinology

## 2018-01-17 ENCOUNTER — Encounter: Payer: Self-pay | Admitting: Endocrinology

## 2018-01-17 VITALS — BP 128/76 | HR 96 | Ht 65.0 in | Wt 191.0 lb

## 2018-01-17 DIAGNOSIS — Z794 Long term (current) use of insulin: Secondary | ICD-10-CM

## 2018-01-17 DIAGNOSIS — E1165 Type 2 diabetes mellitus with hyperglycemia: Secondary | ICD-10-CM | POA: Diagnosis not present

## 2018-01-17 NOTE — Patient Instructions (Signed)
Dinner NOVOLOG 35

## 2018-01-17 NOTE — Progress Notes (Signed)
Patient ID: [name], female   DOB: Sep 21, 1966, 52 y.o.   MRN: [id number]   Reason for Appointment: Diabetes follow-up    History of Present Illness:   Diagnosis: Type 2 diabetes mellitus, date of diagnosis: 2004.  PAST history: She has been treated mostly with insulin since about a year after diagnosis. She has had difficulty with consistent compliance with diet and also compliance with self care including glucose monitoring over the years. She had been mostly treated with basal insulin. Also had been tried on mealtime insulin but she would be noncompliant with this. Was tried on Prandin for mealtime control but difficult to judge efficacy because of lack of postprandial monitoring. Was given Victoza to start in 2011 but did not follow up after this.She was tried on premixed insulin but this did not help her control, mostly because of noncompliance with the doses. She had been using the V- go pump and had better control initially and was better compliant with the daily routine and boluses. She has had frequent education visits also. She stopped using her V.-go pump in 6/15 because of discomfort at the site of application, mostly was using this on her abdomen Also did not improve with a trial of Victoza   RECENT history:    INSULIN dose: Overall HIGHEST blood sugars areBasaglar 36 units in am.  NovoLog 20-25 units up to 3 times a day Non-insulin hypoglycemic drugs: Invokana 300 mg daily  Her most recent A1c is 10%, previously was 8.8  Current management, problems identified:   Her boyfriend is helping her manage her diabetes with testing and insulin doses  She is adjusting her NovoLog at mealtimes or when the blood sugars are higher but generally taking 20-25 units  Despite this her blood sugars later in the day are frequently over 300  Most of her high blood sugars are related to her still consuming large amounts of juice and regular soft drinks  Her meter appears to  have the incorrect date and time and appears to be about 6 hours fast and one day behind,  With this it is difficult to identify when she is checking her blood sugars  However appears that her blood sugars are most consistently high or in the evenings or afternoons  Not clear if she is checking her blood sugars after supper, probably mostly at bedtime  Only in the last couple of days her morning blood sugars appear to be better and reportedly was 153 this morning at 9:30 AM before breakfast  This is because her intake of sweet drinks during the night as less recently with her sleeping more  With her increased caloric intake and insulin she appears to be again gaining weight  This is despite her reportedly continuing Invokana   Glucometer:  FreeStyle.   Blood Glucose readings by monitor download:     Currently checking blood sugars about 4 times a day  Blood sugar range 153-487 Overall AVERAGE 303  Average blood sugar at any given time during just between 235 and 371 Pattern not evaluated because of incorrect date and time  Hypoglycemia frequency: Never.    Food preferences: eating Mostly 1 meal per day around 6-8 pm, variable intake, sandwiches at times or otherwise snacks, still getting regular drinks and juices  Physical activity: exercise: Minimal.  She has difficulties with leg pain and weakness  Certified Diabetes Educator visit: Most recent:, 2/14.     Wt Readings from Last 3 Encounters:  01/17/18 191  lb (86.6 kg)  12/11/17 186 lb 9.6 oz (84.6 kg)  10/19/17 181 lb 8 oz (82.3 kg)   DM labs:   Lab Results  Component Value Date   HGBA1C 10.0 12/11/2017   HGBA1C 8.8 08/08/2017   HGBA1C >15 05/24/2017   Lab Results  Component Value Date   MICROALBUR 0.9 10/09/2017   LDLCALC 41 08/08/2017   CREATININE 0.80 12/11/2017       Other active problems: See review of systems    Allergies as of 01/17/2018      Reactions   Latex Itching, Rash, Other (See  Comments)   Pt states she cannot use condoms - cause an infection.  Use of latex on skin is okay.  Tape causes rash   Sweetening Enhancer [flavoring Agent] Nausea And Vomiting, Other (See Comments)   HEADACHES   Tetracyclines & Related Nausea And Vomiting, Other (See Comments)   ;YEAST INFECTIONS   Aspartame And Phenylalanine Nausea And Vomiting   HEADACHES   Ibuprofen Other (See Comments)   HEADACHES   Trazodone And Nefazodone Other (See Comments)   Hallucinations   Triazolam Other (See Comments)   HALLUCINATIONS      Medication List        Accurate as of 01/17/18  4:14 PM. Always use your most recent med list.          BASAGLAR KWIKPEN 100 UNIT/ML Sopn Inject 0.36 mLs (36 Units total) into the skin daily before breakfast. And pen needles 1/day   BASAGLAR KWIKPEN 100 UNIT/ML Sopn INJECT 0.1 MLS (10 UNITS TOTAL) INTO THE SKIN EVERY MORNING. AND PEN NEEDLES 1/DAY   cyclobenzaprine 10 MG tablet Commonly known as:  FLEXERIL Take 1 tablet (10 mg total) by mouth 3 (three) times daily as needed for muscle spasms.   esomeprazole 20 MG capsule Commonly known as:  NEXIUM Take 20 mg by mouth as needed.   gabapentin 600 MG tablet Commonly known as:  NEURONTIN Take 1 tablet (600 mg total) by mouth 2 (two) times daily.   glucose blood test strip Commonly known as:  FREESTYLE LITE Use to check blood sugars up to 4 times daily. Dx Code E11.65   Glycerin 35 % Liqd Use 1 teaspoon twice a day to relieve dry mouth   insulin aspart 100 UNIT/ML FlexPen Commonly known as:  NOVOLOG Take 10 units before your main meal. If blood sugars are consistently over 250 after the meal start taking 14 units before your main meal.   INVOKANA 300 MG Tabs tablet Generic drug:  canagliflozin TAKE 1 TABLET BY MOUTH DAILY BEFORE BREAKFAST   lidocaine 5 % Commonly known as:  LIDODERM Place 1 patch onto the skin daily. Remove & Discard patch within 12 hours or as directed by MD   oxyCODONE 5 MG  immediate release tablet Commonly known as:  Oxy IR/ROXICODONE Take 5 mg by mouth every 6 (six) hours as needed.   rOPINIRole 0.25 MG tablet Commonly known as:  REQUIP Take 1 tablet (0.25 mg total) by mouth 3 (three) times daily.   silver sulfADIAZINE 1 % cream Commonly known as:  SILVADENE Apply 1 application daily topically.   tamsulosin 0.4 MG Caps capsule Commonly known as:  FLOMAX Take 0.4 mg by mouth every morning.   zolpidem 5 MG tablet Commonly known as:  AMBIEN Take 5 mg by mouth at bedtime as needed for sleep.       Allergies:  Allergies  Allergen Reactions  . Latex Itching, Rash and Other (See  Comments)    Pt states she cannot use condoms - cause an infection.  Use of latex on skin is okay.  Tape causes rash  . Sweetening Enhancer [Flavoring Agent] Nausea And Vomiting and Other (See Comments)    HEADACHES  . Tetracyclines & Related Nausea And Vomiting and Other (See Comments)    ;YEAST INFECTIONS  . Aspartame And Phenylalanine Nausea And Vomiting    HEADACHES  . Ibuprofen Other (See Comments)    HEADACHES  . Trazodone And Nefazodone Other (See Comments)    Hallucinations   . Triazolam Other (See Comments)    HALLUCINATIONS    Past Medical History:  Diagnosis Date  . Anxiety   . Arthritis   . Bipolar disorder ([place])   . Chronic back pain   . Claudication ([place])    a. 12/2013 ABI's: R 0.97, L 0.94.  [name] COPD (chronic obstructive pulmonary disease) ([place])   . Depression   . Diabetic Charcot's foot ([place])   . Diabetic foot ulcer ([facility])    chronic left foot ulcer , great toe/  hx recurrent foot ulcer bilaterally  . DJD (degenerative joint disease)   . Edema of both lower extremities   . GERD (gastroesophageal reflux disease)   . Headache(784.0)   . Hiatal hernia   . History of colon cancer, stage I dx 02-26-2015--- oncologist-- dr [name]--- per last note no recurrence   05-16-2015 s/p  Laparoscopic sigmoid colectomy w/ node bx's (negative nodes per path)   Stage I (pT1,N0,M0) Grade 2  . 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   per MRI multiple small strokes post cardiac cath. --  residual mild memory loss  . History of methicillin resistant staphylococcus aureus (MRSA)   . Hypercholesterolemia   . Insomnia   . Insulin dependent type 2 diabetes mellitus, uncontrolled ([place]) dx 2004   endocrinologist-  dr Dwyane Dee-- last A1c 8.8 in Aug2018:  pt is noncompliant w/ diet, states does note eat breakfast , her first main meal in afternoon  . Neurogenic bladder   . Neuropathy, diabetic ([place])    hands and feet  . OSA (obstructive sleep apnea)    cpap intolerant  . Personality disorder ([place])   . SOB (shortness of breath) on exertion   . SUI (stress urinary incontinence, female) S/P SLING 12-29-2011    Past Surgical History:  Procedure Laterality Date  . ANTERIOR CERVICAL DECOMP/DISCECTOMY FUSION  2000   C5 - 7  . CARDIAC CATHETERIZATION  05-22-2008   DR SKAINS   NO SIGNIFECANT CAD/ NORMAL LV/  EF 65%/  NO WALL MOTION ABNORMALITIES  . CARPAL TUNNEL RELEASE Right 04-25-2013  . [place]; 1992  . CYSTO N/A 04/30/2013   Procedure: CYSTOSCOPY;  Surgeon: Reece Packer, MD;  Location: [facility];  Service: Urology;  Laterality: N/A;  . CYSTOSCOPY WITH INJECTION  05/04/2012   Procedure: CYSTOSCOPY WITH INJECTION;  Surgeon: Reece Packer, MD;  Location: [place];  Service: Urology;  Laterality: N/A;  MACROPLASTIQUE INJECTION  . CYSTOSCOPY WITH INJECTION  08/28/2012   Procedure: CYSTOSCOPY WITH INJECTION;  Surgeon: Reece Packer, MD;  Location[facility];  Service: Urology;  Laterality: N/A;  cysto and macroplastique   . FOOT SURGERY Bilateral   . HERNIA REPAIR  ?1996   "stomach"  . KNEE ARTHROSCOPY W/ ALLOGRAFT IMPANT Left    graft x 2  . KNEE SURGERY     TOTAL 8 SURG'S  .  LAPAROSCOPIC SIGMOID COLECTOMY N/A 04/22/2015   Procedure: LAPAROSCOPIC HAND ASSISTED SIGMOID  COLECTOMY;  Surgeon: Erroll Luna, MD;  Location: [place];  Service: General;  Laterality: N/A;  . LEFT HEART CATHETERIZATION WITH CORONARY ANGIOGRAM N/A 02/12/2014   Procedure: LEFT HEART CATHETERIZATION WITH CORONARY ANGIOGRAM;  Surgeon: Candee Furbish, MD;  Location: [facility];  Service: Cardiovascular;  Laterality: N/A;   No angiographically significant CAD; normal LVSF, LVEDP 12mmHg,  EF 55% (new finding ef 30% myoview 01-08-2014)  . LUMBAR FUSION    . MULTIPLE LAPAROSCOPIES FOR ENDOMETRIOSIS    . PUBOVAGINAL SLING  12/29/2011   Procedure: Gaynelle Arabian;  Surgeon: Reece Packer, MD;  Location: [facility];  Service: Urology;  Laterality: N/A;  cysto and sparc sling   . PUBOVAGINAL SLING N/A 04/30/2013   Procedure: REMOVAL OF VAGINAL MESH;  Surgeon: Reece Packer, MD;  Location: [facility];  Service: Urology;  Laterality: N/A;  . RECONSTURCTION OF CONGENITAL UTERUS ANOMALY  1983  . REPEAT RECONSTRUCTION ACL LEFT KNEE/ SCREWS REMOVED  03-28-2000   CADAVER GRAFT  . TOTAL ABDOMINAL HYSTERECTOMY W/ BILATERAL SALPINGOOPHORECTOMY  1997  . TRANSTHORACIC ECHOCARDIOGRAM  02/13/2014   ef 45%, hypokinesis base inferior and base inferolateral walls  . WOUND DEBRIDEMENT Right 09/05/2016   Procedure: DEBRIDEMENT WOUND WITH GRAFT RIGHT FOOT;  Surgeon: Landis Martins, DPM;  Location: [place];  Service: Podiatry;  Laterality: Right;    Family History  Problem Relation Age of Onset  . Hypertension Mother   . Diabetes Mother   . Cancer - Other Mother        lymphoma   . Cancer - Other Father        lung, bladder cancer   . Heart attack Father   . Cancer - Other Brother        bladder cancer     Social History:  reports that she has been smoking cigarettes.  She has a 180.00 pack-year smoking history. she has never used smokeless tobacco. She reports that she does not drink alcohol or use drugs.  Review of Systems -    She has history of high triglycerides previously treated with  fenofibrate and is not on any treatment  Recently her lipids have been good   Lab Results  Component Value Date   CHOL 103 08/08/2017   HDL 38.00 (L) 08/08/2017   LDLCALC 41 08/08/2017   LDLDIRECT 46.0 12/28/2016   TRIG 118.0 08/08/2017   CHOLHDL 3 08/08/2017     HYPOKALEMIA/hyponatremia:  Improved, low sodium recently was from high sugar  Lab Results  Component Value Date   CREATININE 0.80 12/11/2017   BUN 9 12/11/2017   NA 130 (L) 12/11/2017   K 4.0 12/11/2017   CL 96 12/11/2017   CO2 27 12/11/2017    Painful neuropathy: She has had persistent symptoms including leg pains and numbness as well as difficulty with balance  Previously has tried various drugs including gabapentin without much relief   Foot exam Last showed absent pedal pulses and Absent distal sensation  Followed by podiatrist regularly for foot ulcers, recently healing   Eye exam recently from Dr. Gershon Crane: 1/19  DRY mouth: She was told to start glycerin by prescription to help with lubrication but she has not picked up the prescription for unknown reasons    Examination:   BP 128/76   Pulse 96   Ht 5\' 5"  (1.651 m)   Wt 191 lb (86.6 kg)   SpO2 98%  BMI 31.78 kg/m   Body mass index is 31.78 kg/m.      Assesment/Plan:   Diabetes type 2, with persistently poor control, insulin requiring  See history of present illness for detailed discussion of current diabetes management, blood sugar patterns and problems identified  She is requiring relatively large amounts of insulin especially with her poor diet which is consistent   Recommendations:  Continue to improve intake of sweet drinks and eliminate as much as possible  Increased NovoLog to at least 35 units at suppertime unless blood sugars are near normal  Consistent diet  No change in [place] as yet  Continue Invokana  Make sure she changes her meter battery as needed, new FreeStyle meter given also   Dry mouth: She will check  with pharmacy on the prescription for glycerin that was called in   Patient Instructions  Dinner NOVOLOG 35           Elayne Snare 01/17/2018, 4:14 PM                                                   6

## 2018-01-19 ENCOUNTER — Telehealth: Payer: Self-pay | Admitting: Endocrinology

## 2018-01-19 NOTE — Telephone Encounter (Signed)
Patient husband called stated pharmacy haven't received the prescription for her dry mouth.  Please advise,  Pharmacy:  [id number] - [place], [place] [address] DEA #:  [id number]

## 2018-01-22 MED ORDER — GLYCERIN 35 % MT LIQD: OROMUCOSAL | 1 refills | Status: DC

## 2018-01-22 NOTE — Telephone Encounter (Signed)
Please send the glycerin 35% again

## 2018-01-22 NOTE — Telephone Encounter (Signed)
May refill Novolog FlexPen

## 2018-01-22 NOTE — Telephone Encounter (Signed)
Please advise on below  

## 2018-01-22 NOTE — Telephone Encounter (Addendum)
Done. Called spouse and informed. Spouse requested novolog refill.

## 2018-01-22 NOTE — Telephone Encounter (Signed)
Kumar pt.

## 2018-01-23 ENCOUNTER — Telehealth: Payer: Self-pay

## 2018-01-23 ENCOUNTER — Other Ambulatory Visit: Payer: Self-pay

## 2018-01-23 ENCOUNTER — Ambulatory Visit: Payer: Medicare Other | Admitting: Sports Medicine

## 2018-01-23 MED ORDER — INSULIN ASPART 100 UNIT/ML FLEXPEN: PEN_INJECTOR | SUBCUTANEOUS | 2 refills | Status: DC

## 2018-01-23 MED ORDER — GLYCERIN 35 % MT LIQD: OROMUCOSAL | 1 refills | Status: DC

## 2018-01-23 NOTE — Telephone Encounter (Signed)
New updated prescription has been sent

## 2018-01-23 NOTE — Telephone Encounter (Signed)
I am not aware of any prescription drug, she can talk to pharmacist about this or her PCP

## 2018-01-23 NOTE — Telephone Encounter (Signed)
Need an accurate prescription for Novolog for this patient the patient is stating she is taking 20-25 before breakfast and lunch and 35 before supper is this correct? Please advise

## 2018-01-23 NOTE — Telephone Encounter (Signed)
That is correct 

## 2018-01-23 NOTE — Telephone Encounter (Signed)
Patients boyfriend called today and stated patient has terrible dry mouth and the glycerin that was ordered the pharmacy can not make so is there something else you can prescribe for her please advise

## 2018-01-24 ENCOUNTER — Other Ambulatory Visit: Payer: Self-pay | Admitting: Neurology

## 2018-01-24 NOTE — Telephone Encounter (Signed)
Called and left vm with the advise below and requested a call back if she has any questions

## 2018-01-29 ENCOUNTER — Other Ambulatory Visit: Payer: Self-pay

## 2018-01-29 ENCOUNTER — Encounter (HOSPITAL_BASED_OUTPATIENT_CLINIC_OR_DEPARTMENT_OTHER): Payer: Self-pay

## 2018-01-29 ENCOUNTER — Telehealth: Payer: Self-pay | Admitting: *Deleted

## 2018-01-29 ENCOUNTER — Other Ambulatory Visit: Payer: Self-pay | Admitting: Urology

## 2018-01-29 NOTE — Telephone Encounter (Signed)
Yes you can refill it -Dr. Chauncey Cruel

## 2018-01-29 NOTE — Progress Notes (Signed)
Spoke with:  Horris Latino NPO:  After Midnight, no gum, candy, or mints  NO SMOKING Arrival time:  0930 Labs:  Istat 4 AM medications:  Esomeprazole, Gabapentin, Requip, Flomax Pre op orders: Yes Ride home:  Shanon Brow (husband) [phone]  Last office visit note in chart 01/17/18 Hgb A1C (10) 12/11/17

## 2018-01-29 NOTE — Telephone Encounter (Signed)
Pt states she needs a refill on the flexeril.

## 2018-01-30 ENCOUNTER — Other Ambulatory Visit: Payer: Self-pay | Admitting: Otolaryngology

## 2018-01-30 ENCOUNTER — Ambulatory Visit
Admission: RE | Admit: 2018-01-30 | Discharge: 2018-01-30 | Disposition: A | Discharge status: Home / Self Care | Payer: Medicare Other | Source: Ambulatory Visit | Attending: Otolaryngology | Admitting: Otolaryngology

## 2018-01-30 DIAGNOSIS — J040 Acute laryngitis: Secondary | ICD-10-CM

## 2018-01-30 DIAGNOSIS — R059 Cough, unspecified: Secondary | ICD-10-CM

## 2018-01-30 DIAGNOSIS — R05 Cough: Secondary | ICD-10-CM

## 2018-01-30 MED ORDER — CYCLOBENZAPRINE HCL 10 MG PO TABS: 10.0000 mg | ORAL_TABLET | Freq: Three times a day (TID) | ORAL | 2 refills | Status: DC | PRN

## 2018-01-30 NOTE — Telephone Encounter (Signed)
I spoke with pt's husband, Shanon Brow and he said pt was not in. I left message that Dr. Cannon Kettle had okayed the refill of her requested medication.

## 2018-02-02 ENCOUNTER — Other Ambulatory Visit: Payer: Self-pay

## 2018-02-02 MED ORDER — EMPAGLIFLOZIN 25 MG PO TABS: 25.0000 mg | ORAL_TABLET | Freq: Every day | ORAL | 0 refills | Status: DC

## 2018-02-05 NOTE — Anesthesia Preprocedure Evaluation (Addendum)
Anesthesia Evaluation  Patient identified by MRN, date of birth, ID band Patient awake    Reviewed: Allergy & Precautions, NPO status , Patient's Chart, lab work & pertinent test results  Airway Mallampati: III  TM Distance: >3 FB Neck ROM: Full    Dental no notable dental hx.    Pulmonary sleep apnea , COPD (controlled), Current Smoker,    Pulmonary exam normal breath sounds clear to auscultation       Cardiovascular negative cardio ROS Normal cardiovascular exam Rhythm:Regular Rate:Normal     Neuro/Psych  Headaches, PSYCHIATRIC DISORDERS Anxiety Depression Bipolar Disorder CVA, Residual Symptoms    GI/Hepatic Neg liver ROS, hiatal hernia, GERD  Medicated and Controlled,  Endo/Other  diabetes, Poorly Controlled, Insulin Dependent  Renal/GU negative Renal ROS     Musculoskeletal negative musculoskeletal ROS (+)   Abdominal (+) + obese,   Peds  Hematology negative hematology ROS (+)   Anesthesia Other Findings STRESS INCONTINENCE  Reproductive/Obstetrics                            Anesthesia Physical Anesthesia Plan  ASA: III  Anesthesia Plan: General   Post-op Pain Management:    Induction: Intravenous  PONV Risk Score and Plan: 3 and Ondansetron, Treatment may vary due to age or medical condition and Midazolam  Airway Management Planned: Oral ETT  Additional Equipment:   Intra-op Plan:   Post-operative Plan: Extubation in OR  Informed Consent: I have reviewed the patients History and Physical, chart, labs and discussed the procedure including the risks, benefits and alternatives for the proposed anesthesia with the patient or authorized representative who has indicated his/her understanding and acceptance.   Dental advisory given  Plan Discussed with: CRNA  Anesthesia Plan Comments:        Anesthesia Quick Evaluation

## 2018-02-06 ENCOUNTER — Telehealth: Payer: Self-pay

## 2018-02-06 ENCOUNTER — Ambulatory Visit (HOSPITAL_BASED_OUTPATIENT_CLINIC_OR_DEPARTMENT_OTHER): Payer: Medicare Other | Admitting: Anesthesiology

## 2018-02-06 ENCOUNTER — Encounter (HOSPITAL_BASED_OUTPATIENT_CLINIC_OR_DEPARTMENT_OTHER): Payer: Self-pay

## 2018-02-06 ENCOUNTER — Ambulatory Visit (HOSPITAL_BASED_OUTPATIENT_CLINIC_OR_DEPARTMENT_OTHER)
Admission: RE | Admit: 2018-02-06 | Discharge: 2018-02-06 | Disposition: A | Discharge status: Home / Self Care | Payer: Medicare Other | Source: Ambulatory Visit | Attending: Urology | Admitting: Urology

## 2018-02-06 ENCOUNTER — Encounter (HOSPITAL_BASED_OUTPATIENT_CLINIC_OR_DEPARTMENT_OTHER)
Admission: RE | Disposition: A | Discharge status: Home / Self Care | Payer: Self-pay | Source: Ambulatory Visit | Attending: Urology

## 2018-02-06 DIAGNOSIS — Z888 Allergy status to other drugs, medicaments and biological substances status: Secondary | ICD-10-CM | POA: Diagnosis not present

## 2018-02-06 DIAGNOSIS — N3281 Overactive bladder: Secondary | ICD-10-CM | POA: Diagnosis not present

## 2018-02-06 DIAGNOSIS — F319 Bipolar disorder, unspecified: Secondary | ICD-10-CM | POA: Diagnosis not present

## 2018-02-06 DIAGNOSIS — E7800 Pure hypercholesterolemia, unspecified: Secondary | ICD-10-CM | POA: Diagnosis not present

## 2018-02-06 DIAGNOSIS — F17200 Nicotine dependence, unspecified, uncomplicated: Secondary | ICD-10-CM | POA: Insufficient documentation

## 2018-02-06 DIAGNOSIS — F419 Anxiety disorder, unspecified: Secondary | ICD-10-CM | POA: Insufficient documentation

## 2018-02-06 DIAGNOSIS — Z79899 Other long term (current) drug therapy: Secondary | ICD-10-CM | POA: Diagnosis not present

## 2018-02-06 DIAGNOSIS — G4730 Sleep apnea, unspecified: Secondary | ICD-10-CM | POA: Diagnosis not present

## 2018-02-06 DIAGNOSIS — M1990 Unspecified osteoarthritis, unspecified site: Secondary | ICD-10-CM | POA: Diagnosis not present

## 2018-02-06 DIAGNOSIS — Z6831 Body mass index (BMI) 31.0-31.9, adult: Secondary | ICD-10-CM | POA: Insufficient documentation

## 2018-02-06 DIAGNOSIS — Z886 Allergy status to analgesic agent status: Secondary | ICD-10-CM | POA: Insufficient documentation

## 2018-02-06 DIAGNOSIS — Z85038 Personal history of other malignant neoplasm of large intestine: Secondary | ICD-10-CM | POA: Diagnosis not present

## 2018-02-06 DIAGNOSIS — K219 Gastro-esophageal reflux disease without esophagitis: Secondary | ICD-10-CM | POA: Insufficient documentation

## 2018-02-06 DIAGNOSIS — N3642 Intrinsic sphincter deficiency (ISD): Secondary | ICD-10-CM | POA: Insufficient documentation

## 2018-02-06 DIAGNOSIS — J449 Chronic obstructive pulmonary disease, unspecified: Secondary | ICD-10-CM | POA: Diagnosis not present

## 2018-02-06 DIAGNOSIS — N3946 Mixed incontinence: Secondary | ICD-10-CM | POA: Insufficient documentation

## 2018-02-06 DIAGNOSIS — Z794 Long term (current) use of insulin: Secondary | ICD-10-CM | POA: Diagnosis not present

## 2018-02-06 DIAGNOSIS — Z91040 Latex allergy status: Secondary | ICD-10-CM | POA: Insufficient documentation

## 2018-02-06 DIAGNOSIS — Z881 Allergy status to other antibiotic agents status: Secondary | ICD-10-CM | POA: Insufficient documentation

## 2018-02-06 DIAGNOSIS — E669 Obesity, unspecified: Secondary | ICD-10-CM | POA: Insufficient documentation

## 2018-02-06 DIAGNOSIS — I10 Essential (primary) hypertension: Secondary | ICD-10-CM | POA: Insufficient documentation

## 2018-02-06 DIAGNOSIS — N319 Neuromuscular dysfunction of bladder, unspecified: Secondary | ICD-10-CM | POA: Insufficient documentation

## 2018-02-06 DIAGNOSIS — I6930 Unspecified sequelae of cerebral infarction: Secondary | ICD-10-CM | POA: Diagnosis not present

## 2018-02-06 DIAGNOSIS — N393 Stress incontinence (female) (male): Secondary | ICD-10-CM | POA: Diagnosis present

## 2018-02-06 DIAGNOSIS — E119 Type 2 diabetes mellitus without complications: Secondary | ICD-10-CM | POA: Insufficient documentation

## 2018-02-06 HISTORY — PX: CYSTOSCOPY MACROPLASTIQUE IMPLANT: SHX6636

## 2018-02-06 HISTORY — DX: Fracture of unspecified carpal bone, unspecified wrist, initial encounter for closed fracture: S62.109A

## 2018-02-06 HISTORY — DX: Other amnesia: R41.3

## 2018-02-06 HISTORY — DX: Fracture of mandible, unspecified, initial encounter for closed fracture: S02.609A

## 2018-02-06 LAB — POCT I-STAT 4, (NA,K, GLUC, HGB,HCT)
GLUCOSE: 191 mg/dL — AB (ref 65–99)
HCT: 48 % — ABNORMAL HIGH (ref 36.0–46.0)
Hemoglobin: 16.3 g/dL — ABNORMAL HIGH (ref 12.0–15.0)
POTASSIUM: 4.6 mmol/L (ref 3.5–5.1)
Sodium: 135 mmol/L (ref 135–145)

## 2018-02-06 LAB — GLUCOSE, CAPILLARY: Glucose-Capillary: 194 mg/dL — ABNORMAL HIGH (ref 65–99)

## 2018-02-06 SURGERY — CYSTOSCOPY, WITH MACROPLASTIQUE INJECTION
Anesthesia: General | Site: Bladder

## 2018-02-06 MED ORDER — OXYCODONE HCL 5 MG PO TABS
5.0000 mg | ORAL_TABLET | Freq: Once | ORAL | Status: DC | PRN
Start: 2018-02-06 — End: 2018-02-06
  Filled 2018-02-06: qty 1

## 2018-02-06 MED ORDER — SUCCINYLCHOLINE CHLORIDE 20 MG/ML IJ SOLN
INTRAMUSCULAR | Status: DC | PRN
  Administered 2018-02-06: 80 mg via INTRAVENOUS

## 2018-02-06 MED ORDER — HYDROMORPHONE HCL 1 MG/ML IJ SOLN
0.2500 mg | INTRAMUSCULAR | Status: DC | PRN
  Filled 2018-02-06: qty 0.5

## 2018-02-06 MED ORDER — MENTHOL 3 MG MT LOZG
LOZENGE | OROMUCOSAL | Status: AC
  Filled 2018-02-06: qty 9

## 2018-02-06 MED ORDER — LIDOCAINE HCL (CARDIAC) 20 MG/ML IV SOLN
INTRAVENOUS | Status: DC | PRN
  Administered 2018-02-06: 100 mg via INTRAVENOUS

## 2018-02-06 MED ORDER — CIPROFLOXACIN IN D5W 400 MG/200ML IV SOLN
INTRAVENOUS | Status: AC
  Filled 2018-02-06: qty 200

## 2018-02-06 MED ORDER — OXYCODONE HCL 5 MG/5ML PO SOLN
5.0000 mg | Freq: Once | ORAL | Status: DC | PRN
  Filled 2018-02-06: qty 5

## 2018-02-06 MED ORDER — FENTANYL CITRATE (PF) 100 MCG/2ML IJ SOLN
INTRAMUSCULAR | Status: DC | PRN
  Administered 2018-02-06 (×2): 25 ug via INTRAVENOUS
  Administered 2018-02-06: 50 ug via INTRAVENOUS

## 2018-02-06 MED ORDER — LACTATED RINGERS IV SOLN
INTRAVENOUS | Status: DC
  Administered 2018-02-06 (×2): via INTRAVENOUS
  Filled 2018-02-06: qty 1000

## 2018-02-06 MED ORDER — PROPOFOL 10 MG/ML IV BOLUS
INTRAVENOUS | Status: AC
  Filled 2018-02-06: qty 20

## 2018-02-06 MED ORDER — MIDAZOLAM HCL 5 MG/5ML IJ SOLN
INTRAMUSCULAR | Status: DC | PRN
  Administered 2018-02-06: 2 mg via INTRAVENOUS
  Administered 2018-02-06 (×2): 1 mg via INTRAVENOUS

## 2018-02-06 MED ORDER — PROPOFOL 10 MG/ML IV BOLUS
INTRAVENOUS | Status: DC | PRN
  Administered 2018-02-06: 200 mg via INTRAVENOUS

## 2018-02-06 MED ORDER — PROMETHAZINE HCL 25 MG/ML IJ SOLN
6.2500 mg | INTRAMUSCULAR | Status: DC | PRN
  Filled 2018-02-06: qty 1

## 2018-02-06 MED ORDER — CIPROFLOXACIN IN D5W 400 MG/200ML IV SOLN
400.0000 mg | INTRAVENOUS | Status: AC
  Administered 2018-02-06: 400 mg via INTRAVENOUS
  Filled 2018-02-06: qty 200

## 2018-02-06 MED ORDER — STERILE WATER FOR IRRIGATION IR SOLN
Status: DC | PRN
  Administered 2018-02-06: 3000 mL

## 2018-02-06 MED ORDER — ONDANSETRON HCL 4 MG/2ML IJ SOLN
INTRAMUSCULAR | Status: DC | PRN
  Administered 2018-02-06: 8 mg via INTRAVENOUS

## 2018-02-06 SURGICAL SUPPLY — 30 items
BAG DRAIN URO-CYSTO SKYTR STRL (DRAIN) ×3 IMPLANT
BAG DRN UROCATH (DRAIN) ×1
CATH ROBINSON RED A/P 12FR (CATHETERS) IMPLANT
CATH ROBINSON RED A/P 14FR (CATHETERS) ×1 IMPLANT
CATH SILICONE 14FRX5CC (CATHETERS) ×2 IMPLANT
CLOTH BEACON ORANGE TIMEOUT ST (SAFETY) ×3 IMPLANT
ELECT REM PT RETURN 9FT ADLT (ELECTROSURGICAL)
ELECTRODE REM PT RTRN 9FT ADLT (ELECTROSURGICAL) IMPLANT
GLOVE BIO SURGEON STRL SZ7.5 (GLOVE) IMPLANT
GLOVE BIOGEL PI IND STRL 7.5 (GLOVE) ×2 IMPLANT
GLOVE BIOGEL PI INDICATOR 7.5 (GLOVE) ×4
GLOVE SURG SS PI 7.5 STRL IVOR (GLOVE) ×3 IMPLANT
GOWN STRL REUS W/ TWL LRG LVL3 (GOWN DISPOSABLE) ×1 IMPLANT
GOWN STRL REUS W/ TWL XL LVL3 (GOWN DISPOSABLE) ×1 IMPLANT
GOWN STRL REUS W/TWL LRG LVL3 (GOWN DISPOSABLE) ×6
GOWN STRL REUS W/TWL XL LVL3 (GOWN DISPOSABLE) ×3
INJECTION MACROPLASIQ 2.5ML UN (Female Continence) ×1 IMPLANT
KIT RM TURNOVER CYSTO AR (KITS) ×3 IMPLANT
MACROPLASTIQUE 2.5ML UNIT (Female Continence) ×3 IMPLANT
MANIFOLD NEPTUNE II (INSTRUMENTS) ×2 IMPLANT
NDL RIGID UROPLASTY (NEEDLE) IMPLANT
NDL SAFETY ECLIPSE 18X1.5 (NEEDLE) ×1 IMPLANT
NEEDLE HYPO 18GX1.5 SHARP (NEEDLE) ×3
NEEDLE RIGID UROPLASTY (NEEDLE) ×3 IMPLANT
PACK CYSTO (CUSTOM PROCEDURE TRAY) ×3 IMPLANT
SYR 20CC LL (SYRINGE) ×3 IMPLANT
SYR BULB IRRIGATION 50ML (SYRINGE) IMPLANT
TUBE CONNECTING 12'X1/4 (SUCTIONS)
TUBE CONNECTING 12X1/4 (SUCTIONS) IMPLANT
WATER STERILE IRR 3000ML UROMA (IV SOLUTION) ×3 IMPLANT

## 2018-02-06 NOTE — Interval H&P Note (Signed)
History and Physical Interval Note:  02/06/2018 10:46 AM  [name]  has presented today for surgery, with the diagnosis of STRESS INCONTINENCE  The various methods of treatment have been discussed with the patient and family. After consideration of risks, benefits and other options for treatment, the patient has consented to  Procedure(s): CYSTOSCOPY MACROPLASTIQUE IMPLANT (N/A) as a surgical intervention .  The patient's history has been reviewed, patient examined, no change in status, stable for surgery.  I have reviewed the patient's chart and labs.  Questions were answered to the patient's satisfaction.     Nilay Mangrum A

## 2018-02-06 NOTE — Anesthesia Postprocedure Evaluation (Signed)
Anesthesia Post Note  Patient: [name]  Procedure(s) Performed: CYSTOSCOPY MACROPLASTIQUE IMPLANT (N/A Bladder)     Patient location during evaluation: PACU Anesthesia Type: General Level of consciousness: awake and alert Pain management: pain level controlled Vital Signs Assessment: post-procedure vital signs reviewed and stable Respiratory status: spontaneous breathing, nonlabored ventilation, respiratory function stable and patient connected to nasal cannula oxygen Cardiovascular status: blood pressure returned to baseline and stable Postop Assessment: no apparent nausea or vomiting Anesthetic complications: no    Last Vitals:  Vitals:   02/06/18 1310 02/06/18 1409  BP:  (!) 153/72  Pulse: (!) 101 96  Resp: (!) 23 (!) 22  Temp:  37 C  SpO2: 95% 96%    Last Pain:  Vitals:   02/06/18 1409  TempSrc:   PainSc: 0-No pain                 Aksel Bencomo P Korynn Kenedy

## 2018-02-06 NOTE — Telephone Encounter (Signed)
invokana no longer covered under insurance so once patient finished her invokana she will start Jardiance 25 mg daily

## 2018-02-06 NOTE — Anesthesia Procedure Notes (Signed)
Procedure Name: Intubation Date/Time: 02/06/2018 12:10 PM Performed by: Justice Rocher, CRNA Pre-anesthesia Checklist: Patient identified, Emergency Drugs available, Suction available and Patient being monitored Patient Re-evaluated:Patient Re-evaluated prior to induction Oxygen Delivery Method: Circle system utilized Preoxygenation: Pre-oxygenation with 100% oxygen Induction Type: IV induction Ventilation: Mask ventilation without difficulty Laryngoscope Size: Mac and 4 Grade View: Grade II Tube type: Oral Tube size: 7.5 mm Number of attempts: 1 Airway Equipment and Method: Stylet and Oral airway Placement Confirmation: ETT inserted through vocal cords under direct vision,  positive ETCO2 and breath sounds checked- equal and bilateral Secured at: 22 cm Tube secured with: Tape Dental Injury: Teeth and Oropharynx as per pre-operative assessment

## 2018-02-06 NOTE — H&P (Signed)
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   She has a 1 liter bladder with retention. Her culture was negative. I was treating her constipation and putting her on Rapaflo, though Rapaflo has failed in the past. Residuals and were approximately 150 mL. She was having minimal incontinence. Last residual was 47 mL. I switched her to Flomax.   last visit  The patient has having worse incontinence in the last 6 months wearing no pads be changing her underclothes 3 or 4 times a day. She describes moderately severe stress incontinence but also can have urge incontinence but no bedwetting. She is hoping not to start pads again   In the past I thought she had OAB bladder with a mild stress incontinence. I was concerned about offering a third injection treatment. She should not have a sling. Her last urodynamics were a few years ago. Her bladder was stable at 1 L but her leak point pressure was mild at 70 cm of water   The patient has had eroded mesh removed in the past.   Today on pelvic examination she had a well supported bladder neck. She had a short urethra. I did not see any obvious extrusion but she was a little bit tender. She had no stress incontinence with a good cough twice.   The urine did not look infected but I sent for culture. I don't think she tried any overactive bladder medications in the past.   The only thing that has changed is that she is off her Flomax. I will: Flomax 0.4 mg and see her in about 5 weeks with a residual urine volume. I don't think we'll repeat urodynamics into don't think will change management. We talked about the off label usage of Macroplastique briefly today. This will be discussed in detail pending her Flomax results and next residual. I did not prescribe myrbetriq as an option while she is waiting for the Macroplastique perhaps. I can understand  her wanted to be fixed but I tried to give her reasonable expectations.   Today  Incontinence is stable. Frequency is stable. Flomax did not help. Clinically noninfected. Last culture negative. Residual today was 22 mL   I was surprised when I mentioned a urethral injectable again she could not remember having them   I reviewed my medical records including a Macroplastique treatment done in September 2013. The first treatment helped some and the second treatment reduce her incontinence to 1 or 2 pads. Durability was very reasonable. My concern in her case is efficacy. I spent a lot of time discussing why she leaks and limited options again. I talked about the off label usage of a third treatment. I think this is a very good option for her and we talked about partial responders. She understands there is an employee in on how much we can inject. We talked about cystoscoping under anesthesia and not prior but I would need to stop the case if there was a rare issue not identified previously. She would like to proceed. A compressive sling with a biologic with clean intermittent catheterization is an option to consider but is distant. She has not wanted to go into retention before but she does have severe incontinence. She leaks a lot just with sitting. She is describing though pipe-like urethra. I did not mention urinary diversion. She is soaking many pads. Expectations discussed   Though she has  had a number of procedures I don't think she is at increased risk of a fistula or urethral erosion undiagnosed. She will be double checked for such under anesthesia     ALLERGIES: Ibuprofen CAPS Trazodone and Deriv    MEDICATIONS: Flomax 0.4 mg capsule, ext release 24 hr 1 capsule PO Daily  Novolog Flexpen 100 unit/ml insulin pen Subcutaneous     GU PSH: Change Vaginal Graft - 04-11-13 Cystoscopy Collagen Injection - 2013, 2013, April 11, 2012 Sling Apr 11, 2012      PSH Notes: Revision And Removal Of Prosthetic Vaginal  Graft Vaginal Approach, Endoscopic Injection Of Implant Material Into Bladder Neck, Endoscopic Injection Of Implant Material Into Bladder Neck, Endoscopic Injection Of Implant Material Into Bladder Neck, Vaginal Sling Operation For Stress Incontinence, Shoulder Surgery, Spine Repair, Knee Surgery, Hernia Repair, Shoulder Surgery, Neck Surgery, Foot Surgery   NON-GU PSH: Hernia Repair - 04/11/2009    GU PMH: Mixed incontinence, Urge and stress incontinence - 04-12-2015 Urinary Retention, Unspec, Incomplete bladder emptying - 04-12-15 Bladder, Neuromuscular dysfunction, Unspec, Neurogenic bladder - 04-11-14 Weak Urinary Stream, Weak urinary stream - 2015 Acute Cystitis/UTI, Acute cystitis without hematuria - 2013-04-11 Nocturia, Nocturia - 11-Apr-2013 Nocturnal Enuresis, Enuresis, nocturnal only - 2013-04-11 Pelvic/perineal pain, Vaginal pain - 2013-04-11 Stress Incontinence, Female stress incontinence - 04/11/13 Urinary Frequency, Increased urinary frequency - Apr 11, 2013 Urinary Hesitancy, Hesitancy - 04/11/2013 Urinary Tract Inf, Unspec site, Urinary tract infection - 11-Apr-2013      PMH Notes:  2009-10-13 15:00:34 - Note: Arthritis   NON-GU PMH: Malignant neoplasm of colon, unspecified, Colon cancer - 04/12/2015 Encounter for general adult medical examination without abnormal findings, Encounter for preventive health examination - Apr 11, 2014 Anxiety, Anxiety - 04/11/13 Other injury of unspecified body region, Wound infection - 04-11-2013 Personal history of other diseases of the circulatory system, History of hypertension - 04/11/13 Personal history of other diseases of the nervous system and sense organs, History of sleep apnea - 2013/04/11 Personal history of other endocrine, nutritional and metabolic disease, History of hypercholesterolemia - 04-11-13, History of diabetes mellitus, - Apr 11, 2013 Personal history of other mental and behavioral disorders, History of depression - 04/11/2013 Personal history of other specified conditions, History of heartburn - 04/11/13    FAMILY HISTORY: Death In The  Family Mother - Mother Family Health Status Number - Runs In Family Kidney Cancer - Father   SOCIAL HISTORY: No Social History     Notes: Tobacco Use, Caffeine Use, Marital History - Divorced, Alcohol Use, Occupation:   REVIEW OF SYSTEMS:    GU Review Female:   Patient reports frequent urination, get up at night to urinate, leakage of urine, and stream starts and stops. Patient denies hard to postpone urination, burning /pain with urination, trouble starting your stream, have to strain to urinate, and being pregnant.  Gastrointestinal (Upper):   Patient reports nausea, vomiting, and indigestion/ heartburn.   Gastrointestinal (Lower):   Patient denies diarrhea and constipation.  Constitutional:   Patient reports night sweats and fatigue. Patient denies fever and weight loss.  Skin:   Patient denies skin rash/ lesion and itching.  Eyes:   Patient denies blurred vision and double vision.  Ears/ Nose/ Throat:   Patient denies sore throat and sinus problems.  Hematologic/Lymphatic:   Patient denies swollen glands and easy bruising.  Cardiovascular:   Patient denies leg swelling and chest pains.  Respiratory:   Patient reports cough. Patient denies shortness of breath.  Endocrine:   Patient reports excessive thirst.   Musculoskeletal:   Patient reports  back pain and joint pain.   Neurological:   Patient reports headaches. Patient denies dizziness.  Psychologic:   Patient reports anxiety. Patient denies depression.   VITAL SIGNS:      09/22/2017 10:29 AM  Weight 178 lb / 80.74 kg  Height 65 in / 165.1 cm  BMI 29.6 kg/m   PAST DATA REVIEWED:  Source Of History:  Patient   PROCEDURES:            PVR Ultrasound - 29021  Scanned Volume: 22 cc         Urinalysis Dipstick Dipstick Cont'd  Color: Yellow Bilirubin: Neg  Appearance: Clear Ketones: Neg  Specific Gravity: 1.010 Blood: Neg  pH: 8.0 Protein: Trace  Glucose: 2+ Urobilinogen: 0.2    Nitrites: Neg    Leukocyte Esterase: Neg     ASSESSMENT:      ICD-10 Details  1 GU:   Bladder, Neuromuscular dysfunction, Unspec - N31.9   2   Stress Incontinence - N39.3           Notes:   The patient has stress incontinence We talked about urethral injectables in detail. Pros, cons, general surgical and anesthetic risks, and other options including behavioral therapy, watchful waiting, and surgery were discussed. She understands that injectables are generally successful in 40-70% of cases for stress incontinence, <50% for urge incontinence, and that in a small % of cases the incontinence can worsen. Risks were described but not limited to the risk of persistent, de novo, or worsening incontinence and flow symptoms were discussed. The need for long-term CIC is possible but rare. We also talked about the risk of erosion into bladder, urethra, and/or vagina with sequelae. Bleeding, infection, pain, dysuria, dyspareunia, and urethral irritation risks were discussed. Retreatment rates were discussed. The patient understands that she might not reach her treatment goal and that she might be worse following surgery.    After a thorough review of the management options for the patient's condition the patient  elected to proceed with surgical therapy as noted above. We have discussed the potential benefits and risks of the procedure, side effects of the proposed treatment, the likelihood of the patient achieving the goals of the procedure, and any potential problems that might occur during the procedure or recuperation. Informed consent has been obtained.

## 2018-02-06 NOTE — Discharge Instructions (Signed)
I have reviewed discharge instructions in detail with the patient. They will follow-up with me or their physician as scheduled. My nurse will also be calling the patients as per protocol.  CYSTOSCOPY HOME CARE INSTRUCTIONS  Activity: Rest for the remainder of the day.  Do not drive or operate equipment today.  You may resume normal activities in one to two days as instructed by your physician.   Meals: Drink plenty of liquids and eat light foods such as gelatin or soup this evening.  You may return to a normal meal plan tomorrow.  Return to Work: You may return to work in one to two days or as instructed by your physician.  Special Instructions / Symptoms: Call your physician if any of these symptoms occur:   -persistent or heavy bleeding  -bleeding which continues after first few urination  -large blood clots that are difficult to pass  -urine stream diminishes or stops completely  -fever equal to or higher than 101 degrees Farenheit.  -cloudy urine with a strong, foul odor  -severe pain  Females should always wipe from front to back after elimination.  You may feel some burning pain when you urinate.  This should disappear with time.  Applying moist heat to the lower abdomen or a hot tub bath may help relieve the pain. \ nge follow-up.  Post Anesthesia Home Care Instructions  Activity: Get plenty of rest for the remainder of the day. A responsible individual must stay with you for 24 hours following the procedure.  For the next 24 hours, DO NOT: -Drive a car -Paediatric nurse -Drink alcoholic beverages -Take any medication unless instructed by your physician -Make any legal decisions or sign important papers.  Meals: Start with liquid foods such as gelatin or soup. Progress to regular foods as tolerated. Avoid greasy, spicy, heavy foods. If nausea and/or vomiting occur, drink only clear liquids until the nausea and/or vomiting subsides. Call your physician if vomiting  continues.  Special Instructions/Symptoms: Your throat may feel dry or sore from the anesthesia or the breathing tube placed in your throat during surgery. If this causes discomfort, gargle with warm salt water. The discomfort should disappear within 24 hours.  If you had a scopolamine patch placed behind your ear for the management of post- operative nausea and/or vomiting:  1. The medication in the patch is effective for 72 hours, after which it should be removed.  Wrap patch in a tissue and discard in the trash. Wash hands thoroughly with soap and water. 2. You may remove the patch earlier than 72 hours if you experience unpleasant side effects which may include dry mouth, dizziness or visual disturbances. 3. Avoid touching the patch. Wash your hands with soap and water after contact with the patch.   Prescriptions given:  Ciprofloxacin 250 mg twice daily by mouth Hydrocodone 5/325 1-2 tablets by mouth every 6 hours as needed for pain

## 2018-02-06 NOTE — Progress Notes (Addendum)
02/06/2018 1345 Dr. Matilde Sprang at bedside to see patient, rx given to patient for Ciprofloxacin and Hydrocodone. MD made aware that pt. Able to void 500 cc but post void bladder scan revealed 500 ml residual urine in bladder. Verbal order Dr. Matilde Sprang for pt. To re-attempt to void, if post void bladder scan greater than 800 ml residual urine to page MD, otherwise ok to d/c home. Pt. Assisted back to BR, able to void 400 ml clear yellow urine. Post void bladder scan repeated showing 500 ml residual urine in bladder. D/c home per prior orders.  Davontae Prusinski, Arville Lime

## 2018-02-06 NOTE — Transfer of Care (Signed)
Immediate Anesthesia Transfer of Care Note  Patient: [name]  Procedure(s) Performed: Procedure(s) (LRB): CYSTOSCOPY MACROPLASTIQUE IMPLANT (N/A)  Patient Location: PACU  Anesthesia Type: General  Level of Consciousness: awake, sedated, patient cooperative and responds to stimulation  Airway & Oxygen Therapy: Patient Spontanous Breathing and Patient connected to [place] O2 Coughing - HOB elevated   Post-op Assessment: Report given to PACU RN, Post -op Vital signs reviewed and stable and Patient moving all extremities  Post vital signs: Reviewed and stable  Complications: No apparent anesthesia complications

## 2018-02-06 NOTE — Op Note (Signed)
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 Nicki Reaper Miroslav Gin  The patient has the above diagnosis and consented to the above procedure.  Extra care was taken with leg positioning.  Urine was sterile properly.  Preoperative antibiotics were given.  I used my injection scope with 30 degree offset lens to scope the bladder.  Bladder mucosa and trigone were normal.  She actually had a long healthy looking urethra.  There was some coaptation on her right side.  I did a careful bimanual examination after cystoscopy and she does not have a fistula  With the described technique I injected 1 syringe of Macroplastique 2.5 mL on the left side at 5:00.  There was beautiful coaptation.  There is no extravasation into the bladder or urethra.  I slowly injected.  This is her third injection and I felt I should not inject more based upon previous injections and the intraoperative findings today.  Bladder was emptied with latex free catheter.  When I re-cystoscoped the patient once again she had a very well coapted urethra  Hopefully the operation will reach her treatment goal.

## 2018-02-07 ENCOUNTER — Encounter (HOSPITAL_BASED_OUTPATIENT_CLINIC_OR_DEPARTMENT_OTHER): Payer: Self-pay | Admitting: Urology

## 2018-02-09 ENCOUNTER — Telehealth: Payer: Self-pay | Admitting: *Deleted

## 2018-02-09 ENCOUNTER — Telehealth: Payer: Self-pay | Admitting: Endocrinology

## 2018-02-09 DIAGNOSIS — Z9181 History of falling: Secondary | ICD-10-CM

## 2018-02-09 MED ORDER — GLUCOSE BLOOD VI STRP: ORAL_STRIP | 3 refills | Status: DC

## 2018-02-09 NOTE — Telephone Encounter (Signed)
Pt states she called the pharmacy last night to get a refill of the test strips he needs this to be done immediately where she will be out today.  Please call into [facility] on [address].

## 2018-02-09 NOTE — Telephone Encounter (Signed)
[name] - [facility] states she would like verbal PT orders for home safety check and for equipment in bathroom, pt took a fall 2 nights ago and fell against the tub. [name] - [facility] states PT will put up safety grab bars in the bathroom.

## 2018-02-09 NOTE — Telephone Encounter (Signed)
Sent!

## 2018-02-09 NOTE — Telephone Encounter (Signed)
I ordered PT as recommended with strengthening and balance training as needed.

## 2018-02-09 NOTE — Telephone Encounter (Signed)
Yes I agree. Thanks!

## 2018-02-12 ENCOUNTER — Encounter: Payer: Self-pay | Admitting: Neurology

## 2018-02-12 ENCOUNTER — Ambulatory Visit (INDEPENDENT_AMBULATORY_CARE_PROVIDER_SITE_OTHER): Payer: Medicare Other | Admitting: Neurology

## 2018-02-12 ENCOUNTER — Telehealth: Payer: Self-pay | Admitting: Sports Medicine

## 2018-02-12 VITALS — BP 118/70 | HR 107 | Ht 65.0 in | Wt 196.4 lb

## 2018-02-12 DIAGNOSIS — E538 Deficiency of other specified B group vitamins: Secondary | ICD-10-CM | POA: Diagnosis not present

## 2018-02-12 DIAGNOSIS — E1142 Type 2 diabetes mellitus with diabetic polyneuropathy: Secondary | ICD-10-CM

## 2018-02-12 DIAGNOSIS — F17210 Nicotine dependence, cigarettes, uncomplicated: Secondary | ICD-10-CM | POA: Diagnosis not present

## 2018-02-12 DIAGNOSIS — Z720 Tobacco use: Secondary | ICD-10-CM

## 2018-02-12 MED ORDER — CYANOCOBALAMIN 1000 MCG/ML IJ SOLN: 1000.0000 ug | Freq: Once | INTRAMUSCULAR | 0 refills | Status: AC

## 2018-02-12 MED ORDER — "SYRINGE/NEEDLE (DISP) 25G X 1"" 3 ML MISC": 1.0000 | Freq: Every day | 0 refills | Status: DC

## 2018-02-12 MED ORDER — NORTRIPTYLINE HCL 10 MG PO CAPS: ORAL_CAPSULE | ORAL | 5 refills | Status: DC

## 2018-02-12 NOTE — Telephone Encounter (Signed)
This is Alm[place], physical therapist with [facility]. I was calling to see if I can get a verbal okay for physical therapy services twice a week for three weeks and once a week for one week starting next week. If you could please give me a call back at [phone]. Thank you.

## 2018-02-12 NOTE — Progress Notes (Signed)
Follow-up Visit   Date: 02/12/18    [name] MRN: [id number] DOB: Sep 11, 1966   Interim History: [name] is a 52 y.o. right-handed Caucasian female with insulin-dependent diabetes mellitus (2004, HbA1c 8.8) , tobacco use, GERD, depression, COPD, and chronic pain returning to the clinic for follow-up of neuropathy.  The patient was accompanied to the clinic by fiance who also provides collateral information.    History of present illness: For the past 18 years, she has had burning and stabbing pain of the toes along with numbness.  Over the years, the pain has progressed to involve her feet and lower legs.  She has tried gabapentin, Lyrica, amitriptyline, Cymbalta. She is currently taking gabapentin 300mg  BID, lidocaine patch, and oxyocodone 5mg  prescribed by her PCP, but nothing provides relief.  She is requesting stronger pain medications.  She also has chronic ulceration of the left foot, followed by podiatry.  She walks unassisted and has difficulty with gait due to Charcot foot deformity.  More recently, she has started to have burning tingling of the hands which is worse with tactile stimuli, such as hand-holding.  She also complains of memory lapses where she cannot remember parts of his history, easily forgets appointments and where she is going; frequently misplaces objects, etc. She lives alone and manages her own IADLs.  She is very concerned about her memory because she feels that she may have Alzheimer's dementia.   UPDATE 02/12/2018:  She is here for 6 month follow-up visit.  She had requested formal neurocognitive testing to assess her memory changes, but due to poor effort no meaningful results were obtained.  She had recently established care with psychiatrist for bipolar depression.  She self titrated her gabapentin to 600mg  TID and does not appreciate any improvement in painful paresthesias.  Her labs indicated vitamin B12 deficiency and she has not been able to  get injections regularly with her PCP, so is requesting to have these done at home by her fiance, who administers her insulin.   Medications:  Current Outpatient Medications on File Prior to Visit  Medication Sig Dispense Refill  . cyclobenzaprine (FLEXERIL) 10 MG tablet Take 1 tablet (10 mg total) by mouth 3 (three) times daily as needed for muscle spasms. 90 tablet 2  . empagliflozin (JARDIANCE) 25 MG TABS tablet Take 25 mg by mouth daily. 90 tablet 0  . esomeprazole (NEXIUM) 20 MG capsule Take 20 mg by mouth daily as needed.     . gabapentin (NEURONTIN) 600 MG tablet TAKE 1 TABLET (600 MG TOTAL) BY MOUTH 2 (TWO) TIMES DAILY. (Patient taking differently: Take 600 mg by mouth 3 (three) times daily. ) 60 tablet 5  . glucose blood (FREESTYLE LITE) test strip Use to check blood sugars up to 4 times daily. Dx Code E11.65 100 each 3  . insulin aspart (NOVOLOG) 100 UNIT/ML FlexPen Take 10 units before your main meal. If blood sugars are consistently over 250 after the meal start taking 14 units before your main meal 35 UNITS AT SUPPER 45 mL 2  . Insulin Glargine (BASAGLAR KWIKPEN) 100 UNIT/ML SOPN Inject 0.36 mLs (36 Units total) into the skin daily before breakfast. And pen needles 1/day (Patient taking differently: Inject 36 Units into the skin daily as needed (for blood sugar over 150). And pen needles 1/day) 5 pen 2  . lidocaine (LIDODERM) 5 % Place 1 patch onto the skin daily. Remove & Discard patch within 12 hours or as directed by MD (  Patient taking differently: Place 1 patch onto the skin daily as needed (pain). Remove & Discard patch within 12 hours or as directed by MD) 30 patch 0  . tamsulosin (FLOMAX) 0.4 MG CAPS capsule Take 0.4 mg by mouth every morning.     . zolpidem (AMBIEN) 5 MG tablet Take 5 mg by mouth at bedtime as needed for sleep.     No current facility-administered medications on file prior to visit.     Allergies:  Allergies  Allergen Reactions  . Latex Itching, Rash and  Other (See Comments)    Pt states she cannot use condoms - cause an infection.  Use of latex on skin is okay.  Tape causes rash  . Sweetening Enhancer [Flavoring Agent] Nausea And Vomiting and Other (See Comments)    HEADACHES  . Tetracyclines & Related Nausea And Vomiting and Other (See Comments)    ;YEAST INFECTIONS  . Aspartame And Phenylalanine Nausea And Vomiting    HEADACHES  . Ibuprofen Other (See Comments)    HEADACHES  . Trazodone And Nefazodone Other (See Comments)    Hallucinations   . Triazolam Other (See Comments)    HALLUCINATIONS    Review of Systems:  CONSTITUTIONAL: No fevers, chills, night sweats, or weight loss.  EYES: No visual changes or eye pain ENT: No hearing changes.  No history of nose bleeds.   RESPIRATORY: No cough, wheezing and shortness of breath.   CARDIOVASCULAR: Negative for chest pain, and palpitations.   GI: Negative for abdominal discomfort, blood in stools or black stools.  No recent change in bowel habits.   GU:  No history of incontinence.   MUSCLOSKELETAL: +history of joint pain or swelling.  No myalgias.   SKIN: Negative for lesions, rash, and itching.   ENDOCRINE: Negative for cold or heat intolerance, polydipsia or goiter.   PSYCH:  + depression or anxiety symptoms.   NEURO: As Above.   Vital Signs:  BP 118/70   Pulse (!) 107   Ht 5\' 5"  (1.651 m)   Wt 196 lb 6 oz (89.1 kg)   SpO2 97%   BMI 32.68 kg/m    General: Drowsy appearing, comfortable.  Neurological Exam: MENTAL STATUS including orientation to time, place, person, recent and remote memory, attention span and concentration, language, and fund of knowledge is fairly intact. Speech is not dysarthric.  CRANIAL NERVES:  Pupils equal round and reactive to light.  Normal conjugate, extra-ocular eye movements in all directions of gaze.  No ptosis.  Face is symmetric.   MOTOR:  Motor strength is 5/5 in all extremities, except 4/5 distally in the feet.  No pronator drift.  Tone  is normal.    MSRs:  Right                                                                 Left brachioradialis 2+  brachioradialis 2+  biceps 2+  biceps 2+  triceps 2+  triceps 2+  patellar 1+  patellar 1+  ankle jerk 0  ankle jerk 0   SENSORY:  Reduced vibration at the knees, absent distal to ankles bilaterally.   COORDINATION/GAIT:  Gait is narrow based, slow, poor arm swing.   Data: Lab Results  Component Value Date   TSH 1.80  08/14/2017   Lab Results  Component Value Date   VITAMINB12 171 (L) 08/14/2017     IMPRESSION/PLAN: 1.  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 She has been on a number of medications for pain in the past (gabapentin, Lyrica, amitriptyline, Cymbalta) and is also on oxycodone prescribed by her PCP.   She is taking gabapentin 600mg  TID and continue to have refractory pain.  I will start her on nortriptyline 10mg  at bedtime for 2 week, then increase to 2 tablet at bedtime.  If she does not have any benefit with optimal dose of nortriptyline, the next step his seeing Pain Management.  Again, I stressed the importance of tight diabetes management to prevent progression of the disease, which is poorly controlled (HbA1c 10.0)  2.  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  Formal neuropsychological testing was performed however due to very poor effort, no meaningful results could be concluded.  She is seeing a psychiatrist for her bipolar depression.  3.  Vitamin B12 deficiency.  Start vitamin B12 1018mcg IM injection daily x 7 days, weekly x 4 weeks, then monthly thereafter x 1 year.  First dose administered today and teaching provided to partner.  4.  Tobacco abuse with COPD.  Currently smoking 3-4 packs/day.  Patient was informed of the dangers of tobacco abuse including stroke, cancer, and MI, as well as benefits of tobacco cessation.  Patient is interested in quitting at this time.  Approximately 5 mins were spent  counseling patient cessation techniques. We discussed various methods to help quit smoking, including deciding on a date to quit, joining a support group, pharmacological agents- nicotine gum/patch/lozenges, chantix.  I will reassess her progress at the next follow-up visit.  Call with update in 2 months.   Thank you for allowing me to participate in patient's care.  If I can answer any additional questions, I would be pleased to do so.    Sincerely,    Austen Wygant K. Posey Pronto, DO

## 2018-02-12 NOTE — Patient Instructions (Signed)
Start nortriptyline 10mg  at bedtime for 2 week, then increase to 2 tablet at bedtime  Start Vitamin B12 1059mcg IM injection daily x 7 days, weekly x 4 weeks, then monthly thereafter x 1 year.  Call the office in 2 months with an update with your pain.  If no better, you will need to be referred to pain management   Good luck with quitting smoking!

## 2018-02-12 NOTE — Telephone Encounter (Signed)
Left message informing [place], PT - [facility], Dr. Cannon Kettle had okayed the recommended PT.

## 2018-02-13 ENCOUNTER — Ambulatory Visit (INDEPENDENT_AMBULATORY_CARE_PROVIDER_SITE_OTHER): Payer: Medicare Other | Admitting: Sports Medicine

## 2018-02-13 ENCOUNTER — Encounter: Payer: Self-pay | Admitting: Sports Medicine

## 2018-02-13 ENCOUNTER — Telehealth: Payer: Self-pay | Admitting: *Deleted

## 2018-02-13 VITALS — BP 113/75 | HR 99

## 2018-02-13 DIAGNOSIS — E1142 Type 2 diabetes mellitus with diabetic polyneuropathy: Secondary | ICD-10-CM

## 2018-02-13 DIAGNOSIS — E11610 Type 2 diabetes mellitus with diabetic neuropathic arthropathy: Secondary | ICD-10-CM

## 2018-02-13 DIAGNOSIS — L97521 Non-pressure chronic ulcer of other part of left foot limited to breakdown of skin: Secondary | ICD-10-CM | POA: Diagnosis not present

## 2018-02-13 DIAGNOSIS — M79672 Pain in left foot: Secondary | ICD-10-CM

## 2018-02-13 DIAGNOSIS — M25572 Pain in left ankle and joints of left foot: Secondary | ICD-10-CM

## 2018-02-13 MED ORDER — CYCLOBENZAPRINE HCL 10 MG PO TABS: 10.0000 mg | ORAL_TABLET | Freq: Three times a day (TID) | ORAL | 2 refills | Status: DC | PRN

## 2018-02-13 NOTE — Telephone Encounter (Signed)
Faxed order to [facility].

## 2018-02-13 NOTE — Telephone Encounter (Signed)
-----  Message from [place], [place] sent at 02/13/2018  9:01 AM EST ----- Regarding: MRI L foot and ankle Chronic left ankle pain and sub met 1 ulcer Please eval THanks Dr. Chauncey Cruel

## 2018-02-13 NOTE — Progress Notes (Signed)
Subjective: [name] is a 52 y.o. female patient seen today in office for follow up left foot pain and ulceration on left. Reports pain at left ankle and when walking better with rest however still feels like her left foot is turning out when walking and her legs feel heavy; Patient is sleepy this visit.  Husband reports that they changed some of her meds.   FBS uncontrolled  Patient Active Problem List   Diagnosis Date Noted  . Cancer of sigmoid colon ([place]) 03/09/2015  . OSA (obstructive sleep apnea) 07/04/2014  . Migraine without aura, with intractable migraine, so stated, without mention of status migrainosus 03/26/2014  . Peripheral neuropathy 03/03/2014  . Abnormal stress test 02/12/2014  . CVA (cerebral infarction) 02/12/2014  . Chest pain   . Abnormal heart rhythm   . Edema   . Hypertension   . Hyperlipemia   . Hypercholesterolemia   . Other and unspecified hyperlipidemia 11/14/2013  . Type II diabetes mellitus, uncontrolled ([place]) 07/22/2013  . Essential hypertension 07/22/2013  . 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 Diabetic polyneuropathy ([place]) 07/22/2013    Current Outpatient Medications on File Prior to Visit  Medication Sig Dispense Refill  . empagliflozin (JARDIANCE) 25 MG TABS tablet Take 25 mg by mouth daily. 90 tablet 0  . esomeprazole (NEXIUM) 20 MG capsule Take 20 mg by mouth daily as needed.     . gabapentin (NEURONTIN) 600 MG tablet TAKE 1 TABLET (600 MG TOTAL) BY MOUTH 2 (TWO) TIMES DAILY. (Patient taking differently: Take 600 mg by mouth 3 (three) times daily. ) 60 tablet 5  . glucose blood (FREESTYLE LITE) test strip Use to check blood sugars up to 4 times daily. Dx Code E11.65 100 each 3  . insulin aspart (NOVOLOG) 100 UNIT/ML FlexPen Take 10 units before your main meal. If blood sugars are consistently over 250 after the meal start taking 14 units before your main meal 35 UNITS AT  SUPPER 45 mL 2  . Insulin Glargine (BASAGLAR KWIKPEN) 100 UNIT/ML SOPN Inject 0.36 mLs (36 Units total) into the skin daily before breakfast. And pen needles 1/day (Patient taking differently: Inject 36 Units into the skin daily as needed (for blood sugar over 150). And pen needles 1/day) 5 pen 2  . lidocaine (LIDODERM) 5 % Place 1 patch onto the skin daily. Remove & Discard patch within 12 hours or as directed by MD (Patient taking differently: Place 1 patch onto the skin daily as needed (pain). Remove & Discard patch within 12 hours or as directed by MD) 30 patch 0  . nortriptyline (PAMELOR) 10 MG capsule Start nortriptyline 20m at bedtime for 2 week, then increase to 2 tablet at bedtime 60 capsule 5  . SYRINGE-NEEDLE, DISP, 3 ML (LUER LOCK SAFETY SYRINGES) 25G X 1" 3 ML MISC 1 Package by Does not apply route daily. 50 each 0  . tamsulosin (FLOMAX) 0.4 MG CAPS capsule Take 0.4 mg by mouth every morning.     . zolpidem (AMBIEN) 5 MG tablet Take 5 mg by mouth at bedtime as needed for sleep.     No current facility-administered medications on file prior to visit.     Allergies  Allergen Reactions  . Latex Itching, Rash and Other (See Comments)    Pt states she cannot use condoms - cause an infection.  Use of latex on skin is okay.  Tape causes rash  . Sweetening Enhancer [Flavoring Agent] Nausea  And Vomiting and Other (See Comments)    HEADACHES  . Tetracyclines & Related Nausea And Vomiting and Other (See Comments)    ;YEAST INFECTIONS  . Aspartame And Phenylalanine Nausea And Vomiting    HEADACHES  . Ibuprofen Other (See Comments)    HEADACHES  . Trazodone And Nefazodone Other (See Comments)    Hallucinations   . Triazolam Other (See Comments)    HALLUCINATIONS    Objective: There were no vitals filed for this visit.  General: No acute distress, AAOx3  Right foot:  At right medial midfoot ulceration is well healed with no surrounding infection or re-opening, mild swelling right  foot, no streaking erythema/cellulitis, Capillary fill time <3 seconds in all digits, gross sensation present via light touch to right foot. Protective sensation absent. 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 No pain or crepitation with range of motion right foot.  No pain with calf compression.   To left foot there is partial thickness ulceration with granular base that measures 0.7x0.5x0.1cm similar in size sub met 1 with no active drainage, mild maceration, no odor, no other signs of infection, Subjective occasional pain to left foot and at both sides of ankle.   Nails mildly elongated and thickened   Assessment and Plan:  Problem List Items Addressed This Visit      Endocrine   Diabetic polyneuropathy ([place])   Relevant Medications   cyclobenzaprine (FLEXERIL) 10 MG tablet    Other Visit Diagnoses    Foot ulceration, left, limited to breakdown of skin ([place])    -  Primary   Left foot pain       Diabetic Charcot's foot ([facility])       Relevant Medications   cyclobenzaprine (FLEXERIL) 10 MG tablet   Left ankle pain, unspecified chronicity         -Patient seen and evaluated -Debrided ulceration at left sub met 1 using sterile tissue nipper to the level of the dermis in an excisional fashion to healthy bleeding. Post debridement measurements as above. Applied a medihoney and dry dressing. Advised nursing to change dressing 3x per week consisting of the same  -Complimentarily debrided nails using sterile nail nipper without incident  -Rx MRI to eval ankle and chronic ulcer  -Continue with Flexeril  -Continue with custom shoes and PT at home for stability  -Go to ER or return to office if worsens.  -Return 2-3 weeks for follow up eval of left foot ulcer and review of MRI results.  In the meantime, patient to call office if any issues or problems arise.  Landis Martins, DPM

## 2018-02-22 ENCOUNTER — Other Ambulatory Visit: Payer: Self-pay

## 2018-02-26 ENCOUNTER — Telehealth: Payer: Self-pay | Admitting: Sports Medicine

## 2018-02-26 NOTE — Telephone Encounter (Signed)
This is Company secretary, Architect. I'm currently at a visit with [name] and I wanted to let you know that on her 5th right toe she has a open place. Also, on the bottom of her foot there is like a blister on the right foot as well. My phone number is [phone]. Thank you.

## 2018-02-27 ENCOUNTER — Telehealth: Payer: Self-pay | Admitting: *Deleted

## 2018-02-27 ENCOUNTER — Ambulatory Visit (INDEPENDENT_AMBULATORY_CARE_PROVIDER_SITE_OTHER): Payer: Medicare Other | Admitting: Sports Medicine

## 2018-02-27 ENCOUNTER — Encounter: Payer: Self-pay | Admitting: Sports Medicine

## 2018-02-27 VITALS — BP 120/74 | HR 92 | Resp 16

## 2018-02-27 DIAGNOSIS — M1990 Unspecified osteoarthritis, unspecified site: Secondary | ICD-10-CM

## 2018-02-27 DIAGNOSIS — M779 Enthesopathy, unspecified: Secondary | ICD-10-CM

## 2018-02-27 DIAGNOSIS — E11610 Type 2 diabetes mellitus with diabetic neuropathic arthropathy: Secondary | ICD-10-CM

## 2018-02-27 DIAGNOSIS — L97511 Non-pressure chronic ulcer of other part of right foot limited to breakdown of skin: Secondary | ICD-10-CM

## 2018-02-27 DIAGNOSIS — L97521 Non-pressure chronic ulcer of other part of left foot limited to breakdown of skin: Secondary | ICD-10-CM

## 2018-02-27 DIAGNOSIS — E1142 Type 2 diabetes mellitus with diabetic polyneuropathy: Secondary | ICD-10-CM

## 2018-02-27 DIAGNOSIS — M14671 Charcot's joint, right ankle and foot: Secondary | ICD-10-CM

## 2018-02-27 MED ORDER — CYCLOBENZAPRINE HCL 10 MG PO TABS: 10.0000 mg | ORAL_TABLET | Freq: Three times a day (TID) | ORAL | 2 refills | Status: DC | PRN

## 2018-02-27 MED ORDER — LIDOCAINE 5 % EX PTCH: 1.0000 | MEDICATED_PATCH | Freq: Every day | CUTANEOUS | 2 refills | Status: DC | PRN

## 2018-02-27 NOTE — Telephone Encounter (Signed)
-----   Message from Landis Martins, [place] sent at 02/27/2018  1:03 PM EST ----- Regarding: MRI foot bil  Pain at left medial ankle and worsening charcot changes on right foot Please eval with MRI  They prefer MRI to be scheduled for a Friday -Dr. Cannon Kettle

## 2018-02-27 NOTE — Progress Notes (Addendum)
Subjective: [name] is a 52 y.o. female patient seen today in office for follow up left foot pain and ulceration on left. Reports pain at left ankle and when walking better with rest however still feels like her left foot is turning out when walking and her legs feel heavy; Patient is sleepy this visit.  Husband reports that they changed some of her meds.   FBS uncontrolled 169 this AM and A1c 10 as of 11/2017  Patient Active Problem List   Diagnosis Date Noted  . Cancer of sigmoid colon ([place]) 03/09/2015  . OSA (obstructive sleep apnea) 07/04/2014  . Migraine without aura, with intractable migraine, so stated, without mention of status migrainosus 03/26/2014  . Peripheral neuropathy 03/03/2014  . Abnormal stress test 02/12/2014  . CVA (cerebral infarction) 02/12/2014  . Chest pain   . Abnormal heart rhythm   . Edema   . Hypertension   . Hyperlipemia   . Hypercholesterolemia   . Other and unspecified hyperlipidemia 11/14/2013  . Type II diabetes mellitus, uncontrolled ([place]) 07/22/2013  . Essential hypertension 07/22/2013  . 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 Diabetic polyneuropathy ([place]) 07/22/2013    Current Outpatient Medications on File Prior to Visit  Medication Sig Dispense Refill  . empagliflozin (JARDIANCE) 25 MG TABS tablet Take 25 mg by mouth daily. 90 tablet 0  . esomeprazole (NEXIUM) 20 MG capsule Take 20 mg by mouth daily as needed.     . gabapentin (NEURONTIN) 600 MG tablet TAKE 1 TABLET (600 MG TOTAL) BY MOUTH 2 (TWO) TIMES DAILY. (Patient taking differently: Take 600 mg by mouth 3 (three) times daily. ) 60 tablet 5  . glucose blood (FREESTYLE LITE) test strip Use to check blood sugars up to 4 times daily. Dx Code E11.65 100 each 3  . insulin aspart (NOVOLOG) 100 UNIT/ML FlexPen Take 10 units before your main meal. If blood sugars are consistently over 250 after the meal start taking 14 units  before your main meal 35 UNITS AT SUPPER 45 mL 2  . Insulin Glargine (BASAGLAR KWIKPEN) 100 UNIT/ML SOPN Inject 0.36 mLs (36 Units total) into the skin daily before breakfast. And pen needles 1/day (Patient taking differently: Inject 36 Units into the skin daily as needed (for blood sugar over 150). And pen needles 1/day) 5 pen 2  . nortriptyline (PAMELOR) 10 MG capsule Start nortriptyline 51m at bedtime for 2 week, then increase to 2 tablet at bedtime 60 capsule 5  . SYRINGE-NEEDLE, DISP, 3 ML (LUER LOCK SAFETY SYRINGES) 25G X 1" 3 ML MISC 1 Package by Does not apply route daily. 50 each 0  . tamsulosin (FLOMAX) 0.4 MG CAPS capsule Take 0.4 mg by mouth every morning.     . zolpidem (AMBIEN) 5 MG tablet Take 5 mg by mouth at bedtime as needed for sleep.     No current facility-administered medications on file prior to visit.     Allergies  Allergen Reactions  . Latex Itching, Rash and Other (See Comments)    Pt states she cannot use condoms - cause an infection.  Use of latex on skin is okay.  Tape causes rash  . Sweetening Enhancer [Flavoring Agent] Nausea And Vomiting and Other (See Comments)    HEADACHES  . Tetracyclines & Related Nausea And Vomiting and Other (See Comments)    ;YEAST INFECTIONS  . Aspartame And Phenylalanine Nausea And Vomiting    HEADACHES  . Ibuprofen Other (See  Comments)    HEADACHES  . Trazodone And Nefazodone Other (See Comments)    Hallucinations   . Triazolam Other (See Comments)    HALLUCINATIONS    Objective: There were no vitals filed for this visit.  General: No acute distress, AAOx3  Right foot:  At right medial midfoot ulceration is well healed with no surrounding infection or re-opening, + pre-ulcerative lesion plantar midfoot, + abrasion or pinpoint partial thickness wound at right 5th toe, mild swelling right foot, no streaking erythema/cellulitis, Capillary fill time <3 seconds in all digits, gross sensation present via light touch to right  foot. Protective sensation absent. 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 No pain or crepitation with range of motion right foot.  No pain with calf compression.   To left foot there is partial thickness ulceration with granular base that measures 0.9x0.5x0.1cm slightly larger in size sub met 1 with no active drainage, mild maceration, no odor, no other signs of infection, Subjective occasional pain to left foot and at both sides of ankle with most symptoms along PT tendon.   Nails mildly elongated and thickened   Assessment and Plan:  Problem List Items Addressed This Visit      Endocrine   Diabetic polyneuropathy ([place])   Relevant Medications   lidocaine (LIDODERM) 5 %   cyclobenzaprine (FLEXERIL) 10 MG tablet    Other Visit Diagnoses    Foot ulceration, left, limited to breakdown of skin ([place])    -  Primary   Relevant Medications   lidocaine (LIDODERM) 5 %   Foot ulceration, right, limited to breakdown of skin ([facility])       Diabetic Charcot's foot ([facility])       Relevant Medications   lidocaine (LIDODERM) 5 %   cyclobenzaprine (FLEXERIL) 10 MG tablet   Other Relevant Orders   Walker rolling   Tendonitis       Arthritis       Relevant Medications   cyclobenzaprine (FLEXERIL) 10 MG tablet     -Patient seen and evaluated -Debrided ulceration at left sub met 1 using sterile tissue nipper to the level of the dermis in an excisional fashion to healthy bleeding. Post debridement measurements as above. Applied offloading padding and medihoney to left and right foot and dry dressing. Advised nursing to change dressing 3x per week consisting of the same  -Complimentarily debrided nails using sterile nail nipper without incident  -Rx MRI to eval ankle and chronic ulcer on left and MRI to eval extent of arthritis and charcot changes on right since midfoot bone plantar is becoming more  prominent -Continue lidoderm patch; refill provided  -Continue with Flexeril; refill provided  -Continue with custom shoes and PT at home for stability  -Go to ER or return to office if worsens.  -Return 2-3 weeks for follow up eval of left foot ulcer and right 5th toe abrasion/ulcer and review of MRI results.  In the meantime, patient to call office if any issues or problems arise.  Landis Martins, DPM

## 2018-02-27 NOTE — Telephone Encounter (Signed)
Faxed orders to [facility]. 

## 2018-02-28 DIAGNOSIS — D229 Melanocytic nevi, unspecified: Secondary | ICD-10-CM | POA: Insufficient documentation

## 2018-03-05 ENCOUNTER — Telehealth: Payer: Self-pay | Admitting: Sports Medicine

## 2018-03-05 NOTE — Telephone Encounter (Signed)
Sent message to [facility] for walker orders.

## 2018-03-05 NOTE — Telephone Encounter (Signed)
Good afternoon, this is [name], PT with [facility]. I'm calling in regards to [name]. I'm going to put on hold for the home health physical therapy services. Pt informed me that the wound on her right foot, she was advised not to put too much pressure and also to use a folding walker. Since she does not have a walker yet I'm just going to put on hold as per her request. I will just re-assess her when she has one. If you would just give me a call back at [phone]. Thank you. Have a nice day. Bye bye.

## 2018-03-08 ENCOUNTER — Ambulatory Visit: Payer: Self-pay | Admitting: Hematology

## 2018-03-08 ENCOUNTER — Other Ambulatory Visit: Payer: Self-pay

## 2018-03-13 ENCOUNTER — Ambulatory Visit (INDEPENDENT_AMBULATORY_CARE_PROVIDER_SITE_OTHER): Payer: Medicare Other | Admitting: Sports Medicine

## 2018-03-13 ENCOUNTER — Encounter: Payer: Self-pay | Admitting: Sports Medicine

## 2018-03-13 DIAGNOSIS — L84 Corns and callosities: Secondary | ICD-10-CM

## 2018-03-13 DIAGNOSIS — L97521 Non-pressure chronic ulcer of other part of left foot limited to breakdown of skin: Secondary | ICD-10-CM

## 2018-03-13 DIAGNOSIS — E1142 Type 2 diabetes mellitus with diabetic polyneuropathy: Secondary | ICD-10-CM

## 2018-03-13 DIAGNOSIS — M14671 Charcot's joint, right ankle and foot: Secondary | ICD-10-CM

## 2018-03-13 DIAGNOSIS — M779 Enthesopathy, unspecified: Secondary | ICD-10-CM

## 2018-03-13 NOTE — Progress Notes (Signed)
Subjective: [name] is a 52 y.o. female patient seen today in office for follow up left foot pain and ulceration on left. Reports will get MRI on 3-23. Pain 5/10 in feet.  FBS uncontrolled 460 this AM and A1c 10 as of 11/2017.  Patient Active Problem List   Diagnosis Date Noted  . Cancer of sigmoid colon ([place]) 03/09/2015  . OSA (obstructive sleep apnea) 07/04/2014  . Migraine without aura, with intractable migraine, so stated, without mention of status migrainosus 03/26/2014  . Peripheral neuropathy 03/03/2014  . Abnormal stress test 02/12/2014  . CVA (cerebral infarction) 02/12/2014  . Chest pain   . Abnormal heart rhythm   . Edema   . Hypertension   . Hyperlipemia   . Hypercholesterolemia   . Other and unspecified hyperlipidemia 11/14/2013  . Type II diabetes mellitus, uncontrolled ([place]) 07/22/2013  . Essential hypertension 07/22/2013  . 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 Diabetic polyneuropathy ([place]) 07/22/2013    Current Outpatient Medications on File Prior to Visit  Medication Sig Dispense Refill  . cyclobenzaprine (FLEXERIL) 10 MG tablet Take 1 tablet (10 mg total) by mouth 3 (three) times daily as needed for muscle spasms. 90 tablet 2  . empagliflozin (JARDIANCE) 25 MG TABS tablet Take 25 mg by mouth daily. 90 tablet 0  . esomeprazole (NEXIUM) 20 MG capsule Take 20 mg by mouth daily as needed.     . gabapentin (NEURONTIN) 600 MG tablet TAKE 1 TABLET (600 MG TOTAL) BY MOUTH 2 (TWO) TIMES DAILY. (Patient taking differently: Take 600 mg by mouth 3 (three) times daily. ) 60 tablet 5  . glucose blood (FREESTYLE LITE) test strip Use to check blood sugars up to 4 times daily. Dx Code E11.65 100 each 3  . insulin aspart (NOVOLOG) 100 UNIT/ML FlexPen Take 10 units before your main meal. If blood sugars are consistently over 250 after the meal start taking 14 units before your main meal 35 UNITS AT SUPPER 45  mL 2  . Insulin Glargine (BASAGLAR KWIKPEN) 100 UNIT/ML SOPN Inject 0.36 mLs (36 Units total) into the skin daily before breakfast. And pen needles 1/day (Patient taking differently: Inject 36 Units into the skin daily as needed (for blood sugar over 150). And pen needles 1/day) 5 pen 2  . lidocaine (LIDODERM) 5 % Place 1 patch onto the skin daily as needed (pain). Remove & Discard patch within 12 hours or as directed by MD 30 patch 2  . nortriptyline (PAMELOR) 10 MG capsule Start nortriptyline 30m at bedtime for 2 week, then increase to 2 tablet at bedtime 60 capsule 5  . SYRINGE-NEEDLE, DISP, 3 ML (LUER LOCK SAFETY SYRINGES) 25G X 1" 3 ML MISC 1 Package by Does not apply route daily. 50 each 0  . tamsulosin (FLOMAX) 0.4 MG CAPS capsule Take 0.4 mg by mouth every morning.     . zolpidem (AMBIEN) 5 MG tablet Take 5 mg by mouth at bedtime as needed for sleep.     No current facility-administered medications on file prior to visit.     Allergies  Allergen Reactions  . Latex Itching, Rash and Other (See Comments)    Pt states she cannot use condoms - cause an infection.  Use of latex on skin is okay.  Tape causes rash  . Sweetening Enhancer [Flavoring Agent] Nausea And Vomiting and Other (See Comments)    HEADACHES  . Tetracyclines & Related Nausea And Vomiting and Other (  See Comments)    ;YEAST INFECTIONS  . Aspartame And Phenylalanine Nausea And Vomiting    HEADACHES  . Ibuprofen Other (See Comments)    HEADACHES  . Trazodone And Nefazodone Other (See Comments)    Hallucinations   . Triazolam Other (See Comments)    HALLUCINATIONS    Objective: There were no vitals filed for this visit.  General: No acute distress, AAOx3  Right foot:  At right medial midfoot ulceration is well healed with no surrounding infection or re-opening, + pre-ulcerative lesion plantar midfoot, right 5th toe healed, mild swelling right foot, no streaking erythema/cellulitis, Capillary fill time <3 seconds in  all digits, gross sensation present via light touch to right foot. Protective sensation absent. 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 No pain or crepitation with range of motion right foot.  No pain with calf compression.   To left foot there is partial thickness ulceration with granular base that measures 0.1x0.1x0.1cm smaller in size sub met 1 with no active drainage, no maceration, no odor, no other signs of infection, Subjective occasional pain to left foot and at both sides of ankle with most symptoms along PT tendon.   Nails mildly short and thickened   Assessment and Plan:  Problem List Items Addressed This Visit      Endocrine   Diabetic polyneuropathy ([place])    Other Visit Diagnoses    Foot ulceration, left, limited to breakdown of skin ([place] )    -  Primary   Tendonitis       Charcot's joint of right foot       Pre-ulcerative calluses         -Patient seen and evaluated -Debrided pinpoint ulceration at left sub met 1 using sterile tissue nipper to the level of the dermis in an excisional fashion to healthy bleeding. Post debridement measurements as above. Applied offloading padding and medihoney to left and offloading padding to right foot and dry dressing. Advised nursing to change dressing 3x per week consisting of the same   -Awaiting MRI to eval ankle and chronic ulcer/now almost healed on left and MRI to eval extent of arthritis and charcot changes on right since midfoot bone plantar is becoming more prominent -Continue lidoderm patch PRN  -Continue with Flexeril PRN  -Continue with custom shoes and may hold off on PT at this time until she has had MRIS  -Go to ER or return to office if worsens.  -Return 2 weeks to discuss MRI results and f/u wound care.  In the meantime, patient to call office if any issues or problems arise.  Landis Martins, DPM

## 2018-03-15 ENCOUNTER — Other Ambulatory Visit: Payer: Self-pay

## 2018-03-16 ENCOUNTER — Other Ambulatory Visit: Payer: Self-pay | Admitting: *Deleted

## 2018-03-16 MED ORDER — GLUCOSE BLOOD VI STRP: ORAL_STRIP | 3 refills | Status: DC

## 2018-03-17 ENCOUNTER — Ambulatory Visit
Admission: RE | Admit: 2018-03-17 | Discharge: 2018-03-17 | Disposition: A | Discharge status: Home / Self Care | Payer: Medicare Other | Source: Ambulatory Visit | Attending: Sports Medicine | Admitting: Sports Medicine

## 2018-03-17 DIAGNOSIS — M779 Enthesopathy, unspecified: Secondary | ICD-10-CM

## 2018-03-17 DIAGNOSIS — M14671 Charcot's joint, right ankle and foot: Secondary | ICD-10-CM

## 2018-03-17 MED ORDER — GADOBENATE DIMEGLUMINE 529 MG/ML IV SOLN
18.0000 mL | Freq: Once | INTRAVENOUS | Status: AC | PRN
  Administered 2018-03-17: 18 mL via INTRAVENOUS

## 2018-03-19 ENCOUNTER — Ambulatory Visit (INDEPENDENT_AMBULATORY_CARE_PROVIDER_SITE_OTHER): Payer: Medicare Other | Admitting: Endocrinology

## 2018-03-19 ENCOUNTER — Encounter: Payer: Self-pay | Admitting: Endocrinology

## 2018-03-19 ENCOUNTER — Telehealth: Payer: Self-pay | Admitting: Sports Medicine

## 2018-03-19 VITALS — BP 144/76 | HR 96 | Ht 65.0 in | Wt 199.0 lb

## 2018-03-19 DIAGNOSIS — Z794 Long term (current) use of insulin: Secondary | ICD-10-CM

## 2018-03-19 DIAGNOSIS — E871 Hypo-osmolality and hyponatremia: Secondary | ICD-10-CM

## 2018-03-19 DIAGNOSIS — E782 Mixed hyperlipidemia: Secondary | ICD-10-CM

## 2018-03-19 DIAGNOSIS — E1165 Type 2 diabetes mellitus with hyperglycemia: Secondary | ICD-10-CM | POA: Diagnosis not present

## 2018-03-19 LAB — POCT GLYCOSYLATED HEMOGLOBIN (HGB A1C): HEMOGLOBIN A1C: 9.2

## 2018-03-19 LAB — BASIC METABOLIC PANEL
BUN: 10 mg/dL (ref 6–23)
CO2: 31 meq/L (ref 19–32)
Calcium: 9.4 mg/dL (ref 8.4–10.5)
Chloride: 98 mEq/L (ref 96–112)
Creatinine, Ser: 0.63 mg/dL (ref 0.40–1.20)
GFR: 105.13 mL/min (ref >60.00)
GLUCOSE: 273 mg/dL — AB (ref 70–99)
POTASSIUM: 4.9 meq/L (ref 3.5–5.1)
SODIUM: 135 meq/L (ref 135–145)

## 2018-03-19 LAB — LIPID PANEL
CHOLESTEROL: 143 mg/dL (ref 0–200)
HDL: 35.7 mg/dL — ABNORMAL LOW (ref >39.00)
Total CHOL/HDL Ratio: 4
Triglycerides: 462 mg/dL — ABNORMAL HIGH (ref 0.0–149.0)

## 2018-03-19 LAB — T4, FREE: FREE T4: 0.65 ng/dL (ref 0.60–1.60)

## 2018-03-19 LAB — LDL CHOLESTEROL, DIRECT: Direct LDL: 72 mg/dL

## 2018-03-19 LAB — TSH: TSH: 2.19 u[IU]/mL (ref 0.35–4.50)

## 2018-03-19 NOTE — Telephone Encounter (Signed)
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 I was calling to see if we could get the orders faxed over to our office. I understand she was there one day last week and saw you guys. She is no longer being treated with the medihoney on her left foot. We need the orders from the visit last week whenever she was in there. I just want to kind of see what we are scheduling here. My number is [phone] and the fax number is [phone]. Thank you. Bye bye.

## 2018-03-19 NOTE — Patient Instructions (Signed)
Check some blood sugars after supper also, 2 hours after eating blood sugar should not be over 180  FASTING morning blood sugar should not be over 140  BASAGLAR insulin 36 units daily Take this in the morning when you wake up  regardless of what time you take this On a weekly basis increase the dose by 2-3 units if morning sugars are over 140  Take 35 units of NovoLog before suppertime daily, take at least a minimum of 30 units for the meal  Stop drinking sweet tea and look for low sugar drinks and low sugar juices  Drink more water Start walking as much as possible  Make sure you are taking Jardiance in the morning daily

## 2018-03-19 NOTE — Progress Notes (Signed)
Patient ID: [name], female   DOB: June 28, 1966, 52 y.o.   MRN: [id number]   Reason for Appointment: Diabetes follow-up    History of Present Illness:   Diagnosis: Type 2 diabetes mellitus, date of diagnosis: 2004.  PAST history: She has been treated mostly with insulin since about a year after diagnosis. She has had difficulty with consistent compliance with diet and also compliance with self care including glucose monitoring over the years. She had been mostly treated with basal insulin. Also had been tried on mealtime insulin but she would be noncompliant with this. Was tried on Prandin for mealtime control but difficult to judge efficacy because of lack of postprandial monitoring. Was given Victoza to start in 2011 but did not follow up after this.She was tried on premixed insulin but this did not help her control, mostly because of noncompliance with the doses. She had been using the V- go pump and had better control initially and was better compliant with the daily routine and boluses. She has had frequent education visits also. She stopped using her V.-go pump in 6/15 because of discomfort at the site of application, mostly was using this on her abdomen Also did not improve with a trial of Victoza   RECENT history:    INSULIN dose: Basaglar 0-36 units in am.  NovoLog 20-25 units up to 3 times a day Non-insulin hypoglycemic drugs: Jardiance 25 mg daily  Her recent A1c is 9.2, previous range 8.8-10%,  Current management, problems identified:   Her husband is helping her manage her diabetes with testing and insulin doses, she is not able to see well enough to dial her insulin doses on the pen  For some reason he has not been giving her the [place] and she thinks she has not taken it for a couple of months since she is waking up late in the morning and he does not want to give it to her  Not clear if she is taking Jardiance, this was switched by the insurance from  [place] only for insulin requirements and most likely taking this only when the blood sugar is high and not clear what doses she is taking as her husband is not present in the office today  She is still drinking at least a gallon of juice or sweet tea daily and not trying to restrict this at all  Blood sugars have been high as high as 500 late afternoon even though her main meal is in the evening at dinnertime.  Fasting blood sugars are consistently high  She will sometimes take larger doses of NovoLog when blood sugars are high and last Saturday her blood sugar went down to 64 later that day in the early afternoon since she did not have a full meal  She is gaining weight   Glucometer:  FreeStyle.   Blood Glucose readings by monitor download:     Currently checking blood sugars about 4 times a day  Mean values apply above for all meters except median for One Touch  PRE-MEAL Fasting Lunch Dinner Bedtime Overall  Glucose range:  284-438  64-360  102-500  149-239   Mean/median:     268     Food preferences: eating Mostly 1 meal per day around 6-8 pm, variable intake, sandwiches at times or otherwise snacks, still getting regular drinks and juices  Physical activity: exercise: Minimal.  She has difficulties with leg pain and weakness  Certified Diabetes Educator visit: Most  recent:, 2/14.     Wt Readings from Last 3 Encounters:  03/19/18 199 lb (90.3 kg)  02/12/18 196 lb 6 oz (89.1 kg)  02/06/18 190 lb 9.6 oz (86.5 kg)   DM labs:   Lab Results  Component Value Date   HGBA1C 9.2 03/19/2018   HGBA1C 10.0 12/11/2017   HGBA1C 8.8 08/08/2017   Lab Results  Component Value Date   MICROALBUR 0.9 10/09/2017   LDLCALC 41 08/08/2017   CREATININE 0.63 03/19/2018       Other active problems: See review of systems    Allergies as of 03/19/2018      Reactions   Latex Itching, Rash, Other (See Comments)   Pt states she cannot use condoms - cause an  infection.  Use of latex on skin is okay.  Tape causes rash   Sweetening Enhancer [flavoring Agent] Nausea And Vomiting, Other (See Comments)   HEADACHES   Aspartame And Phenylalanine Nausea And Vomiting   HEADACHES   Ibuprofen Other (See Comments)   HEADACHES   Trazodone And Nefazodone Other (See Comments)   Hallucinations   Triazolam Other (See Comments)   HALLUCINATIONS      Medication List        Accurate as of 03/19/18  8:15 PM. Always use your most recent med list.          BASAGLAR KWIKPEN 100 UNIT/ML Sopn Inject 0.36 mLs (36 Units total) into the skin daily before breakfast. And pen needles 1/day   cyclobenzaprine 10 MG tablet Commonly known as:  FLEXERIL Take 1 tablet (10 mg total) by mouth 3 (three) times daily as needed for muscle spasms.   empagliflozin 25 MG Tabs tablet Commonly known as:  JARDIANCE Take 25 mg by mouth daily.   esomeprazole 20 MG capsule Commonly known as:  NEXIUM Take 20 mg by mouth daily as needed.   gabapentin 600 MG tablet Commonly known as:  NEURONTIN TAKE 1 TABLET (600 MG TOTAL) BY MOUTH 2 (TWO) TIMES DAILY.   glucose blood test strip Commonly known as:  FREESTYLE LITE Use to check blood sugars up to 4 times daily. Dx Code E11.65   insulin aspart 100 UNIT/ML FlexPen Commonly known as:  NOVOLOG Take 10 units before your main meal. If blood sugars are consistently over 250 after the meal start taking 14 units before your main meal 35 UNITS AT SUPPER   lidocaine 5 % Commonly known as:  LIDODERM Place 1 patch onto the skin daily as needed (pain). Remove & Discard patch within 12 hours or as directed by MD   nortriptyline 10 MG capsule Commonly known as:  PAMELOR Start nortriptyline 10mg  at bedtime for 2 week, then increase to 2 tablet at bedtime   SYRINGE-NEEDLE (DISP) 3 ML 25G X 1" 3 ML Misc Commonly known as:  LUER LOCK SAFETY SYRINGES 1 Package by Does not apply route daily.   tamsulosin 0.4 MG Caps capsule Commonly known  as:  FLOMAX Take 0.4 mg by mouth every morning.   zolpidem 5 MG tablet Commonly known as:  AMBIEN Take 5 mg by mouth at bedtime as needed for sleep.       Allergies:  Allergies  Allergen Reactions  . Latex Itching, Rash and Other (See Comments)    Pt states she cannot use condoms - cause an infection.  Use of latex on skin is okay.  Tape causes rash  . Sweetening Enhancer [Flavoring Agent] Nausea And Vomiting and Other (See Comments)  HEADACHES  . Aspartame And Phenylalanine Nausea And Vomiting    HEADACHES  . Ibuprofen Other (See Comments)    HEADACHES  . Trazodone And Nefazodone Other (See Comments)    Hallucinations   . Triazolam Other (See Comments)    HALLUCINATIONS    Past Medical History:  Diagnosis Date  . Anxiety   . Arthritis   . Bipolar disorder ([place])   . Broken jaw ([place])   . Broken wrist   . Chronic back pain   . Claudication ([place])    a. 12/2013 ABI's: R 0.97, L 0.94.  [name] COPD (chronic obstructive pulmonary disease) ([place])   . Depression   . Diabetic Charcot's foot ([place])   . Diabetic foot ulcer ([facility])    chronic left foot ulcer , great toe/  hx recurrent foot ulcer bilaterally  . DJD (degenerative joint disease)   . Edema of both lower extremities   . Episode of memory loss   . GERD (gastroesophageal reflux disease)   . Headache(784.0)   . Hiatal hernia   . History of colon cancer, stage I dx 02-26-2015--- oncologist-- dr [name]--- per last note no recurrence   05-16-2015 s/p  Laparoscopic sigmoid colectomy w/ node bx's (negative nodes per path)  Stage I (pT1,N0,M0) Grade 2  . 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   per MRI multiple small strokes post cardiac cath. --  residual mild memory loss  . History of methicillin resistant staphylococcus aureus (MRSA)   . Hypercholesterolemia   . Insomnia   . Insulin dependent type 2 diabetes mellitus, uncontrolled ([place]) dx 2004   endocrinologist-  dr Dwyane Dee-- last A1c 8.8 in  Aug2018:  pt is noncompliant w/ diet, states does note eat breakfast , her first main meal in afternoon  . Neurogenic bladder   . Neuropathy, diabetic ([place])    hands and feet  . OSA (obstructive sleep apnea)    cpap intolerant  . Personality disorder ([place])   . SOB (shortness of breath) on exertion   . SUI (stress urinary incontinence, female) S/P SLING 12-29-2011    Past Surgical History:  Procedure Laterality Date  . ANTERIOR CERVICAL DECOMP/DISCECTOMY FUSION  2000   C5 - 7  . CARDIAC CATHETERIZATION  05-22-2008   DR SKAINS   NO SIGNIFECANT CAD/ NORMAL LV/  EF 65%/  NO WALL MOTION ABNORMALITIES  . CARPAL TUNNEL RELEASE Right 04-25-2013  . [place]; 1992  . COLONOSCOPY    . CYSTO N/A 04/30/2013   Procedure: CYSTOSCOPY;  Surgeon: Reece Packer, MD;  Location: [facility];  Service: Urology;  Laterality: N/A;  . CYSTOSCOPY MACROPLASTIQUE IMPLANT N/A 02/06/2018   Procedure: CYSTOSCOPY MACROPLASTIQUE IMPLANT;  Surgeon: Bjorn Loser, MD;  Location: [facility];  Service: Urology;  Laterality: N/A;  . CYSTOSCOPY WITH INJECTION  05/04/2012   Procedure: CYSTOSCOPY WITH INJECTION;  Surgeon: Reece Packer, MD;  Location: [facility];  Service: Urology;  Laterality: N/A;  MACROPLASTIQUE INJECTION  . CYSTOSCOPY WITH INJECTION  08/28/2012   Procedure: CYSTOSCOPY WITH INJECTION;  Surgeon: Reece Packer, MD;  [facility];  Service: Urology;  Laterality: N/A;  cysto and macroplastique   . FOOT SURGERY Bilateral   . HERNIA REPAIR  ?1996   "stomach"  . KNEE ARTHROSCOPY W/ ALLOGRAFT IMPANT Left    graft x 2  . KNEE SURGERY     TOTAL 8 SURG'S  . LAPAROSCOPIC SIGMOID COLECTOMY N/A 04/22/2015  Procedure: LAPAROSCOPIC HAND ASSISTED SIGMOID COLECTOMY;  Surgeon: Erroll Luna, MD;  Location: [place];  Service: General;  Laterality: N/A;  . LEFT HEART CATHETERIZATION WITH CORONARY ANGIOGRAM N/A 02/12/2014   Procedure: LEFT HEART  CATHETERIZATION WITH CORONARY ANGIOGRAM;  Surgeon: Candee Furbish, MD;  Location: [facility];  Service: Cardiovascular;  Laterality: N/A;   No angiographically significant CAD; normal LVSF, LVEDP 75mmHg,  EF 55% (new finding ef 30% myoview 01-08-2014)  . LUMBAR FUSION    . MANDIBLE FRACTURE SURGERY    . MULTIPLE LAPAROSCOPIES FOR ENDOMETRIOSIS    . PUBOVAGINAL SLING  12/29/2011   Procedure: Gaynelle Arabian;  Surgeon: Reece Packer, MD;  Location: [facility];  Service: Urology;  Laterality: N/A;  cysto and sparc sling   . PUBOVAGINAL SLING N/A 04/30/2013   Procedure: REMOVAL OF VAGINAL MESH;  Surgeon: Reece Packer, MD;  Location: [facility];  Service: Urology;  Laterality: N/A;  . RECONSTURCTION OF CONGENITAL UTERUS ANOMALY  1983  . REPEAT RECONSTRUCTION ACL LEFT KNEE/ SCREWS REMOVED  03-28-2000   CADAVER GRAFT  . TOTAL ABDOMINAL HYSTERECTOMY W/ BILATERAL SALPINGOOPHORECTOMY  1997  . TRANSTHORACIC ECHOCARDIOGRAM  02/13/2014   ef 45%, hypokinesis base inferior and base inferolateral walls  . UPPER GI ENDOSCOPY    . WOUND DEBRIDEMENT Right 09/05/2016   Procedure: DEBRIDEMENT WOUND WITH GRAFT RIGHT FOOT;  Surgeon: Landis Martins, DPM;  Location: [place];  Service: Podiatry;  Laterality: Right;  . WRIST FRACTURE SURGERY      Family History  Problem Relation Age of Onset  . Hypertension Mother   . Diabetes Mother   . Cancer - Other Mother        lymphoma   . Cancer - Other Father        lung, bladder cancer   . Heart attack Father   . Cancer - Other Brother        bladder cancer     Social History:  reports that she has been smoking cigarettes.  She has a 180.00 pack-year smoking history. She has never used smokeless tobacco. She reports that she does not drink alcohol or use drugs.  Review of Systems -    She has history of high triglycerides previously treated with fenofibrate and is not on any treatment    Lab Results  Component Value Date   CHOL 143 03/19/2018    HDL 35.70 (L) 03/19/2018   LDLCALC 41 08/08/2017   LDLDIRECT 72.0 03/19/2018   TRIG (H) 03/19/2018    462.0 Triglyceride is over 400; calculations on Lipids are invalid.   CHOLHDL 4 03/19/2018     HYPOKALEMIA/hyponatremia: Need follow-up  Lab Results  Component Value Date   CREATININE 0.63 03/19/2018   BUN 10 03/19/2018   NA 135 03/19/2018   K 4.9 03/19/2018   CL 98 03/19/2018   CO2 31 03/19/2018    Painful neuropathy: She has had persistent symptoms including leg pains and numbness as well as difficulty with balance  Previously has tried various drugs including gabapentin without much relief   Foot exams: Last showed absent pedal pulses and Absent distal sensation  Followed by podiatrist regularly for foot ulcers and apparently healed now  Eye exam recently from Dr. Gershon Crane: Done in 1/19  DRY mouth: Persistent She was told to start glycerin by prescription but apparently this is not available    Examination:   BP (!) 144/76   Pulse 96   Ht 5\' 5"  (1.651 m)   Wt 199  lb (90.3 kg)   SpO2 96%   BMI 33.12 kg/m   Body mass index is 33.12 kg/m.      Assesment/Plan:   Diabetes type 2, with persistently poor control, insulin requiring  See history of present illness for detailed discussion of current diabetes management, blood sugar patterns and problems identified  Her A1c is now staying between 9-10%  Even with taking Jardiance or Invokana she is still needing basal bolus insulin regimen Recently compliance with her regimen has been poor with erratic blood sugars because of not taking basal insulin Her husband is responsible for insulin requirements and he is arbitrarily giving her insulin doses and has stopped Basaglar Also not sure if she is getting her Vania Rea recently Again having difficulty with her dietary compliance with her drinking over a gallon of various sweet drinks per day   Recommendations:  Limit intake of sweet drinks especially ice tea  and eliminate as much as possible with lower sugar options and more water   Basaglar to be started at 36 units as before and to take it consistently every day on waking up  Written instructions given for adjusting this at least on a weekly basis to get morning sugars down below 140   She will make sure she is getting Jardiance  To take NovoLog at least at suppertime with 30-35 units and also for increased blood sugars with her simple sugar intake during the day consistent diet  Discussed that she is at risk for further complications and worsening of neuropathy like she has better control  ?  HYPERTENSION: Her blood pressure is higher than usual today we will continue to monitor Electrolytes to be checked  LIPIDS: To have follow-up labs done today, however with her poor diet and glycemic control may have worsening of hypertriglyceridemia   Patient Instructions  Check some blood sugars after supper also, 2 hours after eating blood sugar should not be over 180  FASTING morning blood sugar should not be over 140  BASAGLAR insulin 36 units daily Take this in the morning when you wake up  regardless of what time you take this On a weekly basis increase the dose by 2-3 units if morning sugars are over 140  Take 35 units of NovoLog before suppertime daily, take at least a minimum of 30 units for the meal  Stop drinking sweet tea and look for low sugar drinks and low sugar juices  Drink more water Start walking as much as possible  Make sure you are taking Jardiance in the morning daily     Counseling time on subjects discussed in assessment and plan sections is over 50% of today's 25 minute visit    Elayne Snare 03/19/2018, 8:15 PM

## 2018-03-20 NOTE — Telephone Encounter (Signed)
Thanks yes my last note is the current orders that I recommend  -Dr. Chauncey Cruel

## 2018-03-21 NOTE — Telephone Encounter (Signed)
Left message informing Olivia Mackie, RN of Dr. Leeanne Rio 03/20/2018 5:36pm to continue the medihoney to the left foot.

## 2018-03-22 ENCOUNTER — Other Ambulatory Visit: Payer: Self-pay | Admitting: Endocrinology

## 2018-03-27 ENCOUNTER — Encounter: Payer: Self-pay | Admitting: Sports Medicine

## 2018-03-27 ENCOUNTER — Ambulatory Visit (INDEPENDENT_AMBULATORY_CARE_PROVIDER_SITE_OTHER): Payer: Medicare Other | Admitting: Sports Medicine

## 2018-03-27 DIAGNOSIS — M7751 Other enthesopathy of right foot: Secondary | ICD-10-CM | POA: Diagnosis not present

## 2018-03-27 DIAGNOSIS — M79671 Pain in right foot: Secondary | ICD-10-CM | POA: Diagnosis not present

## 2018-03-27 DIAGNOSIS — M898X9 Other specified disorders of bone, unspecified site: Secondary | ICD-10-CM | POA: Diagnosis not present

## 2018-03-27 DIAGNOSIS — M14671 Charcot's joint, right ankle and foot: Secondary | ICD-10-CM

## 2018-03-27 DIAGNOSIS — E1142 Type 2 diabetes mellitus with diabetic polyneuropathy: Secondary | ICD-10-CM | POA: Diagnosis not present

## 2018-03-27 DIAGNOSIS — L97521 Non-pressure chronic ulcer of other part of left foot limited to breakdown of skin: Secondary | ICD-10-CM

## 2018-03-27 DIAGNOSIS — M79672 Pain in left foot: Secondary | ICD-10-CM | POA: Diagnosis not present

## 2018-03-27 NOTE — Patient Instructions (Signed)
Pre-Operative Instructions  Congratulations, you have decided to take an important step towards improving your quality of life.  You can be assured that the doctors and staff at [facility] will be with you every step of the way.  Here are some important things you should know:  1. Plan to be at the surgery center/hospital at least 1 (one) hour prior to your scheduled time, unless otherwise directed by the surgical center/hospital staff.  You must have a responsible adult accompany you, remain during the surgery and drive you home.  Make sure you have directions to the surgical center/hospital to ensure you arrive on time. 2. If you are having surgery at [facility] or [facility], you will need a copy of your medical history and physical form from your family physician within one month prior to the date of surgery. We will give you a form for your primary physician to complete.  3. We make every effort to accommodate the date you request for surgery.  However, there are times where surgery dates or times have to be moved.  We will contact you as soon as possible if a change in schedule is required.   4. No aspirin/ibuprofen for one week before surgery.  If you are on aspirin, any non-steroidal anti-inflammatory medications (Mobic, Aleve, Ibuprofen) should not be taken seven (7) days prior to your surgery.  You make take Tylenol for pain prior to surgery.  5. Medications - If you are taking daily heart and blood pressure medications, seizure, reflux, allergy, asthma, anxiety, pain or diabetes medications, make sure you notify the surgery center/hospital before the day of surgery so they can tell you which medications you should take or avoid the day of surgery. 6. No food or drink after midnight the night before surgery unless directed otherwise by surgical center/hospital staff. 7. No alcoholic beverages 24-hours prior to surgery.  No smoking 24-hours prior or 24-hours after  surgery. 8. Wear loose pants or shorts. They should be loose enough to fit over bandages, boots, and casts. 9. Don't wear slip-on shoes. Sneakers are preferred. 10. Bring your boot with you to the surgery center/hospital.  Also bring crutches or a walker if your physician has prescribed it for you.  If you do not have this equipment, it will be provided for you after surgery. 11. If you have not been contacted by the surgery center/hospital by the day before your surgery, call to confirm the date and time of your surgery. 12. Leave-time from work may vary depending on the type of surgery you have.  Appropriate arrangements should be made prior to surgery with your employer. 13. Prescriptions will be provided immediately following surgery by your doctor.  Fill these as soon as possible after surgery and take the medication as directed. Pain medications will not be refilled on weekends and must be approved by the doctor. 14. Remove nail polish on the operative foot and avoid getting pedicures prior to surgery. 15. Wash the night before surgery.  The night before surgery wash the foot and leg well with water and the antibacterial soap provided. Be sure to pay special attention to beneath the toenails and in between the toes.  Wash for at least three (3) minutes. Rinse thoroughly with water and dry well with a towel.  Perform this wash unless told not to do so by your physician.  Enclosed: 1 Ice pack (please put in freezer the night before surgery)   1 Hibiclens skin cleaner     Pre-op instructions  If you have any questions regarding the instructions, please do not hesitate to call our office.  [place]: [address], [place], [place] [address] -- [phone]  [place]: [address] [place], [place] [address] -- [phone]  [place]: [address]  [place], [place] [address] -- [phone]  [place]: [address], [address], [place], [place] [address] -- [phone]  Website:  [email or web address] 

## 2018-03-27 NOTE — Progress Notes (Signed)
Subjective: [name] is a 52 y.o. female patient seen today in office for follow up right and left foot pain and ulceration on left. Reports she got her MRI done and here to go over results.Pain 5/10 on left and 10/10 on right with burning to both feet.   FBS uncontrolled 552 this AM and A1c 9.2 as of 03/19/18  Patient Active Problem List   Diagnosis Date Noted  . Cancer of sigmoid colon ([place]) 03/09/2015  . OSA (obstructive sleep apnea) 07/04/2014  . Migraine without aura, with intractable migraine, so stated, without mention of status migrainosus 03/26/2014  . Peripheral neuropathy 03/03/2014  . Abnormal stress test 02/12/2014  . CVA (cerebral infarction) 02/12/2014  . Chest pain   . Abnormal heart rhythm   . Edema   . Hypertension   . Hyperlipemia   . Hypercholesterolemia   . Other and unspecified hyperlipidemia 11/14/2013  . Type II diabetes mellitus, uncontrolled ([place]) 07/22/2013  . Essential hypertension 07/22/2013  . 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 Diabetic polyneuropathy ([place]) 07/22/2013    Current Outpatient Medications on File Prior to Visit  Medication Sig Dispense Refill  . cyclobenzaprine (FLEXERIL) 10 MG tablet Take 1 tablet (10 mg total) by mouth 3 (three) times daily as needed for muscle spasms. 90 tablet 2  . empagliflozin (JARDIANCE) 25 MG TABS tablet Take 25 mg by mouth daily. 90 tablet 0  . esomeprazole (NEXIUM) 20 MG capsule Take 20 mg by mouth daily as needed.     . gabapentin (NEURONTIN) 600 MG tablet TAKE 1 TABLET (600 MG TOTAL) BY MOUTH 2 (TWO) TIMES DAILY. (Patient taking differently: Take 600 mg by mouth 3 (three) times daily. ) 60 tablet 5  . glucose blood (FREESTYLE LITE) test strip Use to check blood sugars up to 4 times daily. Dx Code E11.65 100 each 3  . insulin aspart (NOVOLOG) 100 UNIT/ML FlexPen Take 10 units before your main meal. If blood sugars are consistently over  250 after the meal start taking 14 units before your main meal 35 UNITS AT SUPPER 45 mL 2  . Insulin Glargine (BASAGLAR KWIKPEN) 100 UNIT/ML SOPN Inject 0.36 mLs (36 Units total) into the skin daily before breakfast. And pen needles 1/day 5 pen 2  . INVOKANA 300 MG TABS tablet TAKE 1 TABLET BY MOUTH EVERY DAY BEFORE BREAKFAST 30 tablet 2  . lidocaine (LIDODERM) 5 % Place 1 patch onto the skin daily as needed (pain). Remove & Discard patch within 12 hours or as directed by MD 30 patch 2  . nortriptyline (PAMELOR) 10 MG capsule Start nortriptyline 14m at bedtime for 2 week, then increase to 2 tablet at bedtime 60 capsule 5  . SYRINGE-NEEDLE, DISP, 3 ML (LUER LOCK SAFETY SYRINGES) 25G X 1" 3 ML MISC 1 Package by Does not apply route daily. 50 each 0  . tamsulosin (FLOMAX) 0.4 MG CAPS capsule Take 0.4 mg by mouth every morning.     . zolpidem (AMBIEN) 5 MG tablet Take 5 mg by mouth at bedtime as needed for sleep.     No current facility-administered medications on file prior to visit.     Allergies  Allergen Reactions  . Latex Itching, Rash and Other (See Comments)    Pt states she cannot use condoms - cause an infection.  Use of latex on skin is okay.  Tape causes rash  . Sweetening Enhancer [Flavoring Agent] Nausea And Vomiting and Other (See  Comments)    HEADACHES  . Aspartame And Phenylalanine Nausea And Vomiting    HEADACHES  . Ibuprofen Other (See Comments)    HEADACHES  . Trazodone And Nefazodone Other (See Comments)    Hallucinations   . Triazolam Other (See Comments)    HALLUCINATIONS    Objective: There were no vitals filed for this visit.  General: No acute distress, AAOx3  Right foot: + pre-ulcerative lesion plantar midfoot, mild swelling right foot, no streaking erythema/cellulitis, Capillary fill time <3 seconds in all digits, gross sensation present via light touch to right foot. Protective sensation absent. Right foot deformity significant secondary to charcot with  prominent bone and increased plantarflexed position with mild callus with dry heme plantarly as above. No reproducible pain or crepitation with range of motion right foot.  No pain with calf compression.   To left foot there is a healed ulceration sub met 1 with no active drainage, no maceration, no odor, no other signs of infection, Subjective occasional pain to left foot and at both sides of ankle with most symptoms along PT tendon as previous.   Nails mildly short and thickened   MRI- Left consistent with arthritis and right consistent with bursitis plantar midfoot with bony prominences. No other acute findings.   Assessment and Plan:  Problem List Items Addressed This Visit      Endocrine   Diabetic polyneuropathy ([place])    Other Visit Diagnoses    Foot ulceration, left, limited to breakdown of skin ([place])    -  Primary   healed   Bursitis of right foot       Charcot's joint of right foot       Exostosis       Right foot pain       Left foot pain         -Patient seen and evaluated -MRI results discussed  -Wounds are prematurely healed. Recommend offloading padding to areas. May discontinue wound care since areas are prematurely healed   -Patient opt for surgical management. Consent obtained for Right removal of prominent bone at top, medial, and plantar right foot and excise bursa. Pre and Post op course explained. Risks, benefits, alternatives explained. No guarantees given or implied. Surgical booking slip submitted and provided patient with Surgical packet and info for [facility]. -To use post op shoes after surgery and limit excessive walking and standing after surgery -Continue lidoderm patch PRN  -Continue with Flexeril PRN  -Continue with custom shoes  -Go to ER or return to office if worsens.  -Return after surgery.  In the meantime, patient to call office if any issues or problems arise.  Landis Martins, DPM

## 2018-03-28 ENCOUNTER — Telehealth: Payer: Self-pay | Admitting: *Deleted

## 2018-03-28 NOTE — Telephone Encounter (Signed)
Faxed end of care orders to [facility].

## 2018-03-28 NOTE — Telephone Encounter (Signed)
-----   Message from Landis Martins, [place] sent at 03/27/2018  8:17 PM EDT ----- Regarding: [place] no longer needed. Wounds prematurely healed. Her husband will help with putting offloading padding to both feet until time for foot surgery later this month -Dr. Chauncey Cruel

## 2018-04-03 ENCOUNTER — Other Ambulatory Visit: Payer: Self-pay | Admitting: *Deleted

## 2018-04-03 MED ORDER — INSULIN ASPART 100 UNIT/ML FLEXPEN: PEN_INJECTOR | SUBCUTANEOUS | 2 refills | Status: DC

## 2018-04-04 ENCOUNTER — Telehealth: Payer: Self-pay | Admitting: *Deleted

## 2018-04-04 NOTE — Telephone Encounter (Signed)
Dr. Cannon Kettle [place] needs to go to the ED or urgent care, but if wants to be seen in [place] on Friday have them call to schedule. I informed Shanon Brow, he states they won't be back until late Friday night, so he'll take her to the ED or urgent care.

## 2018-04-04 NOTE — Telephone Encounter (Signed)
Thanks

## 2018-04-04 NOTE — Telephone Encounter (Signed)
Pt's husband, Etta Quill states they are at the beach, Dr. Cannon Kettle had said if pt had any breakdown of the feet to call, and she has 2 open areas with bleeding.

## 2018-04-05 ENCOUNTER — Telehealth: Payer: Self-pay | Admitting: *Deleted

## 2018-04-05 NOTE — Telephone Encounter (Signed)
"  Patient of Dr. Cannon Kettle is coming in on Monday for a pre-admission appointment.  There are no orders in Epic.  We need orders in as soon as possible so we can act upon them on Monday.  We start working up the charts for Monday on tomorrow.  So the orders need to be in today or first thing in the morning.  If you have any questions give me a call back."

## 2018-04-05 NOTE — Telephone Encounter (Signed)
Done

## 2018-04-06 ENCOUNTER — Other Ambulatory Visit: Payer: Self-pay

## 2018-04-06 NOTE — Pre-Procedure Instructions (Signed)
EVALENE VATH  04/06/2018      [id number] [address], [place] [address] Phone: [phone] Fax: [phone]  [place], [place] [address] Phone: [phone] Fax: [phone]  [facility] [place], [address] [place] [address] Phone: [phone] Fax: [phone]    Your procedure is scheduled on  Monday 04/16/18  Report to [facility] Admitting at 815 A.M.  Call this number if you have problems the morning of surgery:  [phone]   Remember:  Do not eat food or drink liquids after midnight.  Take these medicines the morning of surgery with A SIP OF WATER    Do not wear jewelry, make-up or nail polish.  Do not wear lotions, powders, or perfumes, or deodorant.  Do not shave 48 hours prior to surgery.  Men may shave face and neck.  Do not bring valuables to the hospital.  [facility] is not responsible for any belongings or valuables.  Contacts, dentures or bridgework may not be worn into surgery.  Leave your suitcase in the car.  After surgery it may be brought to your room.  For patients admitted to the hospital, discharge time will be determined by your treatment team.  Patients discharged the day of surgery will not be allowed to drive home.   Name and phone number of your driver:    Special instructions:  [facility] - Preparing for Surgery  Before surgery, you can play an important role.  Because skin is not sterile, your skin needs to be as free of germs as possible.  You can reduce the number of germs on you skin by washing with CHG (chlorahexidine gluconate) soap before surgery.  CHG is an antiseptic cleaner which kills germs and bonds with the skin to continue killing germs even after washing.  Please DO NOT use if you have an allergy to CHG or  antibacterial soaps.  If your skin becomes reddened/irritated stop using the CHG and inform your nurse when you arrive at Short Stay.  Do not shave (including legs and underarms) for at least 48 hours prior to the first CHG shower.  You may shave your face.  Please follow these instructions carefully:   1.  Shower with CHG Soap the night before surgery and the                                morning of Surgery.  2.  If you choose to wash your hair, wash your hair first as usual with your       normal shampoo.  3.  After you shampoo, rinse your hair and body thoroughly to remove the                      Shampoo.  4.  Use CHG as you would any other liquid soap.  You can apply chg directly       to the skin and wash gently with scrungie or a clean washcloth.  5.  Apply the CHG Soap to your body ONLY FROM THE NECK DOWN.        Do not use on open wounds or open sores.  Avoid contact with your eyes,  ears, mouth and genitals (private parts).  Wash genitals (private parts)       with your normal soap.  6.  Wash thoroughly, paying special attention to the area where your surgery        will be performed.  7.  Thoroughly rinse your body with warm water from the neck down.  8.  DO NOT shower/wash with your normal soap after using and rinsing off       the CHG Soap.  9.  Pat yourself dry with a clean towel.            10.  Wear clean pajamas.            11.  Place clean sheets on your bed the night of your first shower and do not        sleep with pets.  Day of Surgery  Do not apply any lotions/deoderants the morning of surgery.  Please wear clean clothes to the hospital/surgery center.     Please read over the following fact sheets that you were given. MRSA Information and Surgical Site Infection Prevention

## 2018-04-09 ENCOUNTER — Inpatient Hospital Stay (HOSPITAL_COMMUNITY)
Admission: EM | Admit: 2018-04-09 | Discharge: 2018-04-16 | DRG: 853 | Disposition: A | Discharge status: Rehabilitation Facility | Payer: Medicare Other | Attending: Internal Medicine | Admitting: Internal Medicine

## 2018-04-09 ENCOUNTER — Encounter (HOSPITAL_COMMUNITY): Payer: Self-pay

## 2018-04-09 ENCOUNTER — Inpatient Hospital Stay (HOSPITAL_COMMUNITY)
Admission: RE | Admit: 2018-04-09 | Discharge: 2018-04-09 | Disposition: A | Discharge status: Home / Self Care | Payer: Self-pay | Source: Ambulatory Visit

## 2018-04-09 ENCOUNTER — Encounter (HOSPITAL_COMMUNITY): Payer: Self-pay | Admitting: Emergency Medicine

## 2018-04-09 ENCOUNTER — Emergency Department (HOSPITAL_COMMUNITY): Payer: Medicare Other

## 2018-04-09 ENCOUNTER — Other Ambulatory Visit: Payer: Self-pay

## 2018-04-09 ENCOUNTER — Encounter (HOSPITAL_COMMUNITY): Payer: Self-pay | Admitting: Vascular Surgery

## 2018-04-09 DIAGNOSIS — G4700 Insomnia, unspecified: Secondary | ICD-10-CM | POA: Diagnosis present

## 2018-04-09 DIAGNOSIS — S88119D Complete traumatic amputation at level between knee and ankle, unspecified lower leg, subsequent encounter: Secondary | ICD-10-CM | POA: Diagnosis not present

## 2018-04-09 DIAGNOSIS — Z636 Dependent relative needing care at home: Secondary | ICD-10-CM | POA: Diagnosis not present

## 2018-04-09 DIAGNOSIS — Z794 Long term (current) use of insulin: Secondary | ICD-10-CM | POA: Diagnosis not present

## 2018-04-09 DIAGNOSIS — Z6835 Body mass index (BMI) 35.0-35.9, adult: Secondary | ICD-10-CM

## 2018-04-09 DIAGNOSIS — K219 Gastro-esophageal reflux disease without esophagitis: Secondary | ICD-10-CM | POA: Diagnosis present

## 2018-04-09 DIAGNOSIS — F17200 Nicotine dependence, unspecified, uncomplicated: Secondary | ICD-10-CM | POA: Diagnosis not present

## 2018-04-09 DIAGNOSIS — E1140 Type 2 diabetes mellitus with diabetic neuropathy, unspecified: Secondary | ICD-10-CM | POA: Diagnosis present

## 2018-04-09 DIAGNOSIS — E11610 Type 2 diabetes mellitus with diabetic neuropathic arthropathy: Secondary | ICD-10-CM | POA: Diagnosis present

## 2018-04-09 DIAGNOSIS — L02611 Cutaneous abscess of right foot: Secondary | ICD-10-CM

## 2018-04-09 DIAGNOSIS — R7881 Bacteremia: Secondary | ICD-10-CM | POA: Diagnosis not present

## 2018-04-09 DIAGNOSIS — Z801 Family history of malignant neoplasm of trachea, bronchus and lung: Secondary | ICD-10-CM

## 2018-04-09 DIAGNOSIS — R440 Auditory hallucinations: Secondary | ICD-10-CM | POA: Diagnosis not present

## 2018-04-09 DIAGNOSIS — M6510 Other infective (teno)synovitis, unspecified site: Secondary | ICD-10-CM

## 2018-04-09 DIAGNOSIS — N182 Chronic kidney disease, stage 2 (mild): Secondary | ICD-10-CM | POA: Diagnosis present

## 2018-04-09 DIAGNOSIS — A401 Sepsis due to streptococcus, group B: Principal | ICD-10-CM | POA: Diagnosis present

## 2018-04-09 DIAGNOSIS — Z8249 Family history of ischemic heart disease and other diseases of the circulatory system: Secondary | ICD-10-CM

## 2018-04-09 DIAGNOSIS — Z89511 Acquired absence of right leg below knee: Secondary | ICD-10-CM | POA: Diagnosis not present

## 2018-04-09 DIAGNOSIS — J449 Chronic obstructive pulmonary disease, unspecified: Secondary | ICD-10-CM | POA: Diagnosis present

## 2018-04-09 DIAGNOSIS — E1169 Type 2 diabetes mellitus with other specified complication: Secondary | ICD-10-CM | POA: Diagnosis present

## 2018-04-09 DIAGNOSIS — R450 Nervousness: Secondary | ICD-10-CM | POA: Diagnosis not present

## 2018-04-09 DIAGNOSIS — E11628 Type 2 diabetes mellitus with other skin complications: Secondary | ICD-10-CM

## 2018-04-09 DIAGNOSIS — F330 Major depressive disorder, recurrent, mild: Secondary | ICD-10-CM | POA: Diagnosis present

## 2018-04-09 DIAGNOSIS — L0390 Cellulitis, unspecified: Secondary | ICD-10-CM | POA: Diagnosis not present

## 2018-04-09 DIAGNOSIS — E1165 Type 2 diabetes mellitus with hyperglycemia: Secondary | ICD-10-CM | POA: Diagnosis not present

## 2018-04-09 DIAGNOSIS — D631 Anemia in chronic kidney disease: Secondary | ICD-10-CM | POA: Diagnosis present

## 2018-04-09 DIAGNOSIS — Z4781 Encounter for orthopedic aftercare following surgical amputation: Secondary | ICD-10-CM | POA: Diagnosis not present

## 2018-04-09 DIAGNOSIS — F419 Anxiety disorder, unspecified: Secondary | ICD-10-CM | POA: Diagnosis not present

## 2018-04-09 DIAGNOSIS — Z981 Arthrodesis status: Secondary | ICD-10-CM

## 2018-04-09 DIAGNOSIS — E785 Hyperlipidemia, unspecified: Secondary | ICD-10-CM | POA: Diagnosis present

## 2018-04-09 DIAGNOSIS — A419 Sepsis, unspecified organism: Secondary | ICD-10-CM

## 2018-04-09 DIAGNOSIS — Z807 Family history of other malignant neoplasms of lymphoid, hematopoietic and related tissues: Secondary | ICD-10-CM

## 2018-04-09 DIAGNOSIS — I739 Peripheral vascular disease, unspecified: Secondary | ICD-10-CM | POA: Diagnosis not present

## 2018-04-09 DIAGNOSIS — E871 Hypo-osmolality and hyponatremia: Secondary | ICD-10-CM | POA: Diagnosis present

## 2018-04-09 DIAGNOSIS — L02415 Cutaneous abscess of right lower limb: Secondary | ICD-10-CM | POA: Diagnosis present

## 2018-04-09 DIAGNOSIS — L97414 Non-pressure chronic ulcer of right heel and midfoot with necrosis of bone: Secondary | ICD-10-CM | POA: Diagnosis present

## 2018-04-09 DIAGNOSIS — E119 Type 2 diabetes mellitus without complications: Secondary | ICD-10-CM | POA: Diagnosis not present

## 2018-04-09 DIAGNOSIS — L02419 Cutaneous abscess of limb, unspecified: Secondary | ICD-10-CM

## 2018-04-09 DIAGNOSIS — Z8673 Personal history of transient ischemic attack (TIA), and cerebral infarction without residual deficits: Secondary | ICD-10-CM

## 2018-04-09 DIAGNOSIS — E876 Hypokalemia: Secondary | ICD-10-CM | POA: Diagnosis not present

## 2018-04-09 DIAGNOSIS — E11621 Type 2 diabetes mellitus with foot ulcer: Secondary | ICD-10-CM

## 2018-04-09 DIAGNOSIS — F17210 Nicotine dependence, cigarettes, uncomplicated: Secondary | ICD-10-CM | POA: Diagnosis present

## 2018-04-09 DIAGNOSIS — I5030 Unspecified diastolic (congestive) heart failure: Secondary | ICD-10-CM | POA: Diagnosis not present

## 2018-04-09 DIAGNOSIS — I429 Cardiomyopathy, unspecified: Secondary | ICD-10-CM | POA: Diagnosis present

## 2018-04-09 DIAGNOSIS — M65171 Other infective (teno)synovitis, right ankle and foot: Secondary | ICD-10-CM | POA: Diagnosis present

## 2018-04-09 DIAGNOSIS — F4310 Post-traumatic stress disorder, unspecified: Secondary | ICD-10-CM | POA: Diagnosis not present

## 2018-04-09 DIAGNOSIS — Z91040 Latex allergy status: Secondary | ICD-10-CM

## 2018-04-09 DIAGNOSIS — L03115 Cellulitis of right lower limb: Secondary | ICD-10-CM | POA: Diagnosis present

## 2018-04-09 DIAGNOSIS — R0902 Hypoxemia: Secondary | ICD-10-CM | POA: Diagnosis present

## 2018-04-09 DIAGNOSIS — N760 Acute vaginitis: Secondary | ICD-10-CM | POA: Diagnosis present

## 2018-04-09 DIAGNOSIS — Z85038 Personal history of other malignant neoplasm of large intestine: Secondary | ICD-10-CM

## 2018-04-09 DIAGNOSIS — Z8052 Family history of malignant neoplasm of bladder: Secondary | ICD-10-CM

## 2018-04-09 DIAGNOSIS — G9341 Metabolic encephalopathy: Secondary | ICD-10-CM | POA: Diagnosis present

## 2018-04-09 DIAGNOSIS — E1122 Type 2 diabetes mellitus with diabetic chronic kidney disease: Secondary | ICD-10-CM | POA: Diagnosis present

## 2018-04-09 DIAGNOSIS — B488 Other specified mycoses: Secondary | ICD-10-CM | POA: Diagnosis present

## 2018-04-09 DIAGNOSIS — Z833 Family history of diabetes mellitus: Secondary | ICD-10-CM

## 2018-04-09 DIAGNOSIS — Z22322 Carrier or suspected carrier of Methicillin resistant Staphylococcus aureus: Secondary | ICD-10-CM

## 2018-04-09 DIAGNOSIS — L089 Local infection of the skin and subcutaneous tissue, unspecified: Secondary | ICD-10-CM

## 2018-04-09 DIAGNOSIS — M86271 Subacute osteomyelitis, right ankle and foot: Secondary | ICD-10-CM | POA: Diagnosis present

## 2018-04-09 DIAGNOSIS — E1142 Type 2 diabetes mellitus with diabetic polyneuropathy: Secondary | ICD-10-CM | POA: Diagnosis not present

## 2018-04-09 DIAGNOSIS — R6520 Severe sepsis without septic shock: Secondary | ICD-10-CM | POA: Diagnosis present

## 2018-04-09 DIAGNOSIS — Z9111 Patient's noncompliance with dietary regimen: Secondary | ICD-10-CM | POA: Diagnosis not present

## 2018-04-09 DIAGNOSIS — E11319 Type 2 diabetes mellitus with unspecified diabetic retinopathy without macular edema: Secondary | ICD-10-CM | POA: Diagnosis present

## 2018-04-09 DIAGNOSIS — S88119S Complete traumatic amputation at level between knee and ankle, unspecified lower leg, sequela: Secondary | ICD-10-CM | POA: Diagnosis not present

## 2018-04-09 DIAGNOSIS — D72823 Leukemoid reaction: Secondary | ICD-10-CM | POA: Diagnosis not present

## 2018-04-09 DIAGNOSIS — F339 Major depressive disorder, recurrent, unspecified: Secondary | ICD-10-CM | POA: Diagnosis present

## 2018-04-09 LAB — CBC WITH DIFFERENTIAL/PLATELET
BASOS ABS: 0 10*3/uL (ref 0.0–0.1)
BASOS PCT: 0 %
EOS ABS: 0 10*3/uL (ref 0.0–0.7)
Eosinophils Relative: 0 %
HCT: 39.1 % (ref 36.0–46.0)
Hemoglobin: 13 g/dL (ref 12.0–15.0)
LYMPHS PCT: 2 %
Lymphs Abs: 0.6 10*3/uL — ABNORMAL LOW (ref 0.7–4.0)
MCH: 31.6 pg (ref 26.0–34.0)
MCHC: 33.2 g/dL (ref 30.0–36.0)
MCV: 95.1 fL (ref 78.0–100.0)
MONO ABS: 1.5 10*3/uL — AB (ref 0.1–1.0)
Monocytes Relative: 5 %
NEUTROS PCT: 93 %
Neutro Abs: 28.2 10*3/uL — ABNORMAL HIGH (ref 1.7–7.7)
PLATELETS: 261 10*3/uL (ref 150–400)
RBC: 4.11 MIL/uL (ref 3.87–5.11)
RDW: 13.9 % (ref 11.5–15.5)
WBC: 30.3 10*3/uL — ABNORMAL HIGH (ref 4.0–10.5)

## 2018-04-09 LAB — URINALYSIS, ROUTINE W REFLEX MICROSCOPIC
Bilirubin Urine: NEGATIVE
HGB URINE DIPSTICK: NEGATIVE
Ketones, ur: NEGATIVE mg/dL
Nitrite: NEGATIVE
PH: 6 (ref 5.0–8.0)
Protein, ur: NEGATIVE mg/dL
Specific Gravity, Urine: 1.022 (ref 1.005–1.030)

## 2018-04-09 LAB — BLOOD GAS, VENOUS
Acid-Base Excess: 4.8 mmol/L — ABNORMAL HIGH (ref 0.0–2.0)
Bicarbonate: 29.6 mmol/L — ABNORMAL HIGH (ref 20.0–28.0)
FIO2: 21
O2 Saturation: 57.9 %
PCO2 VEN: 47 mmHg (ref 44.0–60.0)
PH VEN: 7.416 (ref 7.250–7.430)
Patient temperature: 98.6
pO2, Ven: 32.6 mmHg (ref 32.0–45.0)

## 2018-04-09 LAB — COMPREHENSIVE METABOLIC PANEL
ALK PHOS: 110 U/L (ref 38–126)
ALT: 16 U/L (ref 14–54)
ANION GAP: 16 — AB (ref 5–15)
AST: 16 U/L (ref 15–41)
Albumin: 3.3 g/dL — ABNORMAL LOW (ref 3.5–5.0)
BILIRUBIN TOTAL: 0.3 mg/dL (ref 0.3–1.2)
BUN: 15 mg/dL (ref 6–20)
CALCIUM: 9.5 mg/dL (ref 8.9–10.3)
CO2: 25 mmol/L (ref 22–32)
Chloride: 90 mmol/L — ABNORMAL LOW (ref 101–111)
Creatinine, Ser: 1.05 mg/dL — ABNORMAL HIGH (ref 0.44–1.00)
GFR, EST NON AFRICAN AMERICAN: 60 mL/min — AB (ref >60)
Glucose, Bld: 385 mg/dL — ABNORMAL HIGH (ref 65–99)
Potassium: 4.6 mmol/L (ref 3.5–5.1)
Sodium: 131 mmol/L — ABNORMAL LOW (ref 135–145)
TOTAL PROTEIN: 7.6 g/dL (ref 6.5–8.1)

## 2018-04-09 LAB — GLUCOSE, CAPILLARY: Glucose-Capillary: 126 mg/dL — ABNORMAL HIGH (ref 65–99)

## 2018-04-09 LAB — I-STAT CG4 LACTIC ACID, ED
Lactic Acid, Venous: 3.4 mmol/L (ref 0.5–1.9)
Lactic Acid, Venous: 1.77 mmol/L (ref 0.5–1.9)

## 2018-04-09 LAB — RAPID URINE DRUG SCREEN, HOSP PERFORMED
AMPHETAMINES: NOT DETECTED
BENZODIAZEPINES: NOT DETECTED
Barbiturates: NOT DETECTED
COCAINE: NOT DETECTED
OPIATES: NOT DETECTED
TETRAHYDROCANNABINOL: NOT DETECTED

## 2018-04-09 LAB — CBG MONITORING, ED: GLUCOSE-CAPILLARY: 527 mg/dL — AB (ref 65–99)

## 2018-04-09 MED ORDER — ENOXAPARIN SODIUM 40 MG/0.4ML ~~LOC~~ SOLN
40.0000 mg | Freq: Every day | SUBCUTANEOUS | Status: DC
  Administered 2018-04-10: 40 mg via SUBCUTANEOUS
  Filled 2018-04-09: qty 0.4

## 2018-04-09 MED ORDER — SODIUM CHLORIDE 0.9 % IV SOLN
INTRAVENOUS | Status: DC
  Administered 2018-04-09 – 2018-04-11 (×3): via INTRAVENOUS

## 2018-04-09 MED ORDER — VANCOMYCIN HCL IN DEXTROSE 1-5 GM/200ML-% IV SOLN: 1000.0000 mg | Freq: Once | INTRAVENOUS | Status: DC

## 2018-04-09 MED ORDER — MAGNESIUM CITRATE PO SOLN: 1.0000 | Freq: Once | ORAL | Status: DC | PRN

## 2018-04-09 MED ORDER — ACETAMINOPHEN 325 MG PO TABS
650.0000 mg | ORAL_TABLET | Freq: Once | ORAL | Status: AC
Start: 2018-04-09 — End: 2018-04-09
  Administered 2018-04-09: 650 mg via ORAL
  Filled 2018-04-09: qty 2

## 2018-04-09 MED ORDER — VANCOMYCIN HCL 10 G IV SOLR
2000.0000 mg | INTRAVENOUS | Status: AC
  Administered 2018-04-09: 2000 mg via INTRAVENOUS
  Filled 2018-04-09: qty 2000

## 2018-04-09 MED ORDER — IPRATROPIUM BROMIDE 0.02 % IN SOLN: 0.5000 mg | Freq: Four times a day (QID) | RESPIRATORY_TRACT | Status: DC | PRN

## 2018-04-09 MED ORDER — INSULIN GLARGINE 100 UNIT/ML ~~LOC~~ SOLN
36.0000 [IU] | Freq: Every day | SUBCUTANEOUS | Status: DC
  Administered 2018-04-10 – 2018-04-12 (×3): 36 [IU] via SUBCUTANEOUS
  Filled 2018-04-09 (×5): qty 0.36

## 2018-04-09 MED ORDER — SENNOSIDES-DOCUSATE SODIUM 8.6-50 MG PO TABS: 1.0000 | ORAL_TABLET | Freq: Every evening | ORAL | Status: DC | PRN

## 2018-04-09 MED ORDER — PIPERACILLIN-TAZOBACTAM 3.375 G IVPB 30 MIN
3.3750 g | Freq: Once | INTRAVENOUS | Status: AC
  Administered 2018-04-09: 3.375 g via INTRAVENOUS
  Filled 2018-04-09: qty 50

## 2018-04-09 MED ORDER — SODIUM CHLORIDE 0.9 % IV BOLUS
1000.0000 mL | Freq: Once | INTRAVENOUS | Status: AC
  Administered 2018-04-09: 1000 mL via INTRAVENOUS

## 2018-04-09 MED ORDER — PIPERACILLIN-TAZOBACTAM 3.375 G IVPB
3.3750 g | Freq: Three times a day (TID) | INTRAVENOUS | Status: AC
  Administered 2018-04-10 – 2018-04-11 (×5): 3.375 g via INTRAVENOUS
  Filled 2018-04-09 (×5): qty 50

## 2018-04-09 MED ORDER — LIDOCAINE 5 % EX PTCH
1.0000 | MEDICATED_PATCH | Freq: Every day | CUTANEOUS | Status: DC | PRN
  Filled 2018-04-09: qty 1

## 2018-04-09 MED ORDER — INSULIN ASPART 100 UNIT/ML ~~LOC~~ SOLN
0.0000 [IU] | SUBCUTANEOUS | Status: DC
  Administered 2018-04-10: 2 [IU] via SUBCUTANEOUS
  Administered 2018-04-10 (×2): 5 [IU] via SUBCUTANEOUS
  Administered 2018-04-10 (×2): 2 [IU] via SUBCUTANEOUS
  Administered 2018-04-11: 3 [IU] via SUBCUTANEOUS
  Administered 2018-04-11: 2 [IU] via SUBCUTANEOUS
  Administered 2018-04-11: 3 [IU] via SUBCUTANEOUS
  Administered 2018-04-11 (×2): 5 [IU] via SUBCUTANEOUS
  Administered 2018-04-12: 2 [IU] via SUBCUTANEOUS
  Administered 2018-04-12 – 2018-04-13 (×5): 3 [IU] via SUBCUTANEOUS
  Administered 2018-04-13: 2 [IU] via SUBCUTANEOUS
  Administered 2018-04-14 (×3): 3 [IU] via SUBCUTANEOUS
  Administered 2018-04-15: 8 [IU] via SUBCUTANEOUS
  Administered 2018-04-15 (×2): 2 [IU] via SUBCUTANEOUS
  Administered 2018-04-15: 5 [IU] via SUBCUTANEOUS
  Administered 2018-04-15: 2 [IU] via SUBCUTANEOUS
  Administered 2018-04-16 (×3): 3 [IU] via SUBCUTANEOUS
  Administered 2018-04-16: 5 [IU] via SUBCUTANEOUS

## 2018-04-09 MED ORDER — ONDANSETRON HCL 4 MG/2ML IJ SOLN
4.0000 mg | Freq: Four times a day (QID) | INTRAMUSCULAR | Status: DC | PRN
  Administered 2018-04-13: 4 mg via INTRAVENOUS

## 2018-04-09 MED ORDER — VANCOMYCIN HCL 10 G IV SOLR
1500.0000 mg | INTRAVENOUS | Status: DC
  Administered 2018-04-10: 1500 mg via INTRAVENOUS
  Filled 2018-04-09: qty 1500

## 2018-04-09 MED ORDER — BISACODYL 5 MG PO TBEC: 5.0000 mg | DELAYED_RELEASE_TABLET | Freq: Every day | ORAL | Status: DC | PRN

## 2018-04-09 MED ORDER — ACETAMINOPHEN 325 MG PO TABS
650.0000 mg | ORAL_TABLET | Freq: Four times a day (QID) | ORAL | Status: DC | PRN
  Administered 2018-04-10: 650 mg via ORAL
  Filled 2018-04-09: qty 2

## 2018-04-09 MED ORDER — PIPERACILLIN-TAZOBACTAM 3.375 G IVPB 30 MIN: 3.3750 g | Freq: Once | INTRAVENOUS | Status: DC

## 2018-04-09 MED ORDER — ONDANSETRON HCL 4 MG PO TABS: 4.0000 mg | ORAL_TABLET | Freq: Four times a day (QID) | ORAL | Status: DC | PRN

## 2018-04-09 MED ORDER — SODIUM CHLORIDE 0.9% FLUSH
3.0000 mL | Freq: Two times a day (BID) | INTRAVENOUS | Status: DC
  Administered 2018-04-10 – 2018-04-16 (×11): 3 mL via INTRAVENOUS

## 2018-04-09 MED ORDER — ACETAMINOPHEN 650 MG RE SUPP
650.0000 mg | Freq: Four times a day (QID) | RECTAL | Status: DC | PRN
  Administered 2018-04-11 – 2018-04-12 (×2): 650 mg via RECTAL
  Filled 2018-04-09 (×2): qty 1

## 2018-04-09 MED ORDER — ALBUTEROL SULFATE (2.5 MG/3ML) 0.083% IN NEBU
2.5000 mg | INHALATION_SOLUTION | Freq: Four times a day (QID) | RESPIRATORY_TRACT | Status: DC | PRN
  Administered 2018-04-11: 2.5 mg via RESPIRATORY_TRACT
  Filled 2018-04-09: qty 3

## 2018-04-09 MED ORDER — LORAZEPAM 2 MG/ML IJ SOLN
1.0000 mg | Freq: Once | INTRAMUSCULAR | Status: AC
  Administered 2018-04-09: 1 mg via INTRAVENOUS
  Filled 2018-04-09: qty 1

## 2018-04-09 NOTE — H&P (Signed)
History and Physical   [facility] [place] [facility] [place] Admission History and Physical [employer], D.O.    Patient Name: [name] MR#: [id number] Date of Birth: 12/28/1965 Date of Admission: 04/09/2018  Referring MD/NP/PA: Dr. Regenia Skeeter Primary Care Physician: Tamsen Roers, MD  Chief Complaint:  Chief Complaint  Patient presents with  . Altered Mental Status  . Hyperglycemia   Please note the entire history is obtained from the patient's emergency department chart, emergency department provider and the patient's family who is at the bedside. Patient's personal history is limited by altered mental status.  HPI: [name] is a 52 y.o. female with a known history of DM, COPD, GERD, OSA , CVA, HLD and insomnia presents to the emergency department for evaluation of AMS.  Patient was in a usual state of health until two days ago when she became confused, weak.  Husband states that she has had worsening of a wound on the bottom of her right foot associated with Charcot deformity and is scheduled to see podiatry for procedure on 4/22.  States right lower extremity is red, swollen and wound is draining.  He initially attributed her AMS to a new sleeping pill for her insomnia.  Patient herself is awake, arousable but does not answer questions appropriately.    EMS/ED Course: Patient received Vanco, Zosyn, fluids. Medical admission has been requested for further management of sepsis 2/2 diabetic foot infection.  Review of Systems:  Unable to obtain 2/2 AMS   Past Medical History:  Diagnosis Date  . Anxiety   . Arthritis   . Bipolar disorder ([place])   . Broken jaw ([place])   . Broken wrist   . Chronic back pain   . Claudication ([place])    a. 12/2013 ABI's: R 0.97, L 0.94.  [name] COPD (chronic obstructive pulmonary disease) ([place])   . Depression   . Diabetic Charcot's foot ([place])   . Diabetic foot ulcer ([facility])    chronic left foot ulcer , great toe/  hx recurrent foot ulcer  bilaterally  . DJD (degenerative joint disease)   . Edema of both lower extremities   . Episode of memory loss   . GERD (gastroesophageal reflux disease)   . Headache(784.0)   . Hiatal hernia   . History of colon cancer, stage I dx 02-26-2015--- oncologist-- dr [name]--- per last note no recurrence   05-16-2015 s/p  Laparoscopic sigmoid colectomy w/ node bx's (negative nodes per path)  Stage I (pT1,N0,M0) Grade 2  . 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   per MRI multiple small strokes post cardiac cath. --  residual mild memory loss  . History of methicillin resistant staphylococcus aureus (MRSA)   . Hypercholesterolemia   . Insomnia   . Insulin dependent type 2 diabetes mellitus, uncontrolled ([place]) dx 2004   endocrinologist-  dr Dwyane Dee-- last A1c 8.8 in Aug2018:  pt is noncompliant w/ diet, states does note eat breakfast , her first main meal in afternoon  . Neurogenic bladder   . Neuropathy, diabetic ([place])    hands and feet  . OSA (obstructive sleep apnea)    cpap intolerant  . Personality disorder ([place])   . SOB (shortness of breath) on exertion   . Stroke ([place])   . SUI (stress urinary incontinence, female) S/P SLING 12-29-2011    Past Surgical History:  Procedure Laterality Date  . ABDOMINAL HYSTERECTOMY    . ANTERIOR CERVICAL DECOMP/DISCECTOMY FUSION  2000  C5 - 7  . CARDIAC CATHETERIZATION  05-22-2008   DR SKAINS   NO SIGNIFECANT CAD/ NORMAL LV/  EF 65%/  NO WALL MOTION ABNORMALITIES  . CARPAL TUNNEL RELEASE Right 04-25-2013  . [place]; 1992  . COLONOSCOPY    . CYSTO N/A 04/30/2013   Procedure: CYSTOSCOPY;  Surgeon: Reece Packer, MD;  Location: [facility];  Service: Urology;  Laterality: N/A;  . CYSTOSCOPY MACROPLASTIQUE IMPLANT N/A 02/06/2018   Procedure: CYSTOSCOPY MACROPLASTIQUE IMPLANT;  Surgeon: Bjorn Loser, MD;  Location: [facility];  Service: Urology;  Laterality: N/A;  . CYSTOSCOPY WITH INJECTION   05/04/2012   Procedure: CYSTOSCOPY WITH INJECTION;  Surgeon: Reece Packer, MD;  Location: [place];  Service: Urology;  Laterality: N/A;  MACROPLASTIQUE INJECTION  . CYSTOSCOPY WITH INJECTION  08/28/2012   Procedure: CYSTOSCOPY WITH INJECTION;  Surgeon: Reece Packer, MD;  Location[facility];  Service: Urology;  Laterality: N/A;  cysto and macroplastique   . FOOT SURGERY Bilateral   . HERNIA REPAIR  ?1996   "stomach"  . KNEE ARTHROSCOPY W/ ALLOGRAFT IMPANT Left    graft x 2  . KNEE SURGERY     TOTAL 8 SURG'S  . LAPAROSCOPIC SIGMOID COLECTOMY N/A 04/22/2015   Procedure: LAPAROSCOPIC HAND ASSISTED SIGMOID COLECTOMY;  Surgeon: Erroll Luna, MD;  Location: [place];  Service: General;  Laterality: N/A;  . LEFT HEART CATHETERIZATION WITH CORONARY ANGIOGRAM N/A 02/12/2014   Procedure: LEFT HEART CATHETERIZATION WITH CORONARY ANGIOGRAM;  Surgeon: Candee Furbish, MD;  Location: [facility];  Service: Cardiovascular;  Laterality: N/A;   No angiographically significant CAD; normal LVSF, LVEDP 58mmHg,  EF 55% (new finding ef 30% myoview 01-08-2014)  . LUMBAR FUSION    . MANDIBLE FRACTURE SURGERY    . MULTIPLE LAPAROSCOPIES FOR ENDOMETRIOSIS    . PUBOVAGINAL SLING  12/29/2011   Procedure: Gaynelle Arabian;  Surgeon: Reece Packer, MD;  Location: [facility];  Service: Urology;  Laterality: N/A;  cysto and sparc sling   . PUBOVAGINAL SLING N/A 04/30/2013   Procedure: REMOVAL OF VAGINAL MESH;  Surgeon: Reece Packer, MD;  Location: [facility];  Service: Urology;  Laterality: N/A;  . RECONSTURCTION OF CONGENITAL UTERUS ANOMALY  1983  . REPEAT RECONSTRUCTION ACL LEFT KNEE/ SCREWS REMOVED  03-28-2000   CADAVER GRAFT  . TOTAL ABDOMINAL HYSTERECTOMY W/ BILATERAL SALPINGOOPHORECTOMY  1997  . TRANSTHORACIC ECHOCARDIOGRAM  02/13/2014   ef 45%, hypokinesis base inferior and base inferolateral walls  . UPPER GI ENDOSCOPY    . WOUND DEBRIDEMENT Right  09/05/2016   Procedure: DEBRIDEMENT WOUND WITH GRAFT RIGHT FOOT;  Surgeon: Landis Martins, DPM;  Location: [place];  Service: Podiatry;  Laterality: Right;  . WRIST FRACTURE SURGERY       reports that she has been smoking cigarettes.  She has a 180.00 pack-year smoking history. She has never used smokeless tobacco. She reports that she does not drink alcohol or use drugs.  Allergies  Allergen Reactions  . Latex Itching, Rash and Other (See Comments)    Pt states she cannot use condoms - cause an infection.  Use of latex on skin is okay.  Tape causes rash  . Sweetening Enhancer [Flavoring Agent] Nausea And Vomiting and Other (See Comments)    HEADACHES  . Aspartame And Phenylalanine Nausea And Vomiting    HEADACHES  . Ibuprofen Other (See Comments)    HEADACHES  . Trazodone And Nefazodone Other (See Comments)  Hallucinations   . Triazolam Other (See Comments)    HALLUCINATIONS    Family History  Problem Relation Age of Onset  . Hypertension Mother   . Diabetes Mother   . Cancer - Other Mother        lymphoma   . Cancer - Other Father        lung, bladder cancer   . Heart attack Father   . Cancer - Other Brother        bladder cancer     Prior to Admission medications   Medication Sig Start Date End Date Taking? Authorizing Provider  cyclobenzaprine (FLEXERIL) 10 MG tablet Take 1 tablet (10 mg total) by mouth 3 (three) times daily as needed for muscle spasms. 02/27/18   Landis Martins, DPM  empagliflozin (JARDIANCE) 25 MG TABS tablet Take 25 mg by mouth daily. 02/02/18   Elayne Snare, MD  esomeprazole (NEXIUM) 20 MG capsule Take 20 mg by mouth daily as needed.     [provider]  gabapentin (NEURONTIN) 600 MG tablet TAKE 1 TABLET (600 MG TOTAL) BY MOUTH 2 (TWO) TIMES DAILY. Patient taking differently: Take 600 mg by mouth 3 (three) times daily.  01/24/18   Patel, Arvin Collard K, DO  glucose blood (FREESTYLE LITE) test strip Use to check blood sugars up to 4 times daily. Dx  Code E11.65 03/16/18   Elayne Snare, MD  insulin aspart (NOVOLOG) 100 UNIT/ML FlexPen Take 10 units before your main meal. If blood sugars are consistently over 250 after the meal start taking 14 units before your main meal 35 UNITS AT SUPPER 04/03/18   Elayne Snare, MD  Insulin Glargine (BASAGLAR KWIKPEN) 100 UNIT/ML SOPN Inject 0.36 mLs (36 Units total) into the skin daily before breakfast. And pen needles 1/day 12/11/17   Elayne Snare, MD  INVOKANA 300 MG TABS tablet TAKE 1 TABLET BY MOUTH EVERY DAY BEFORE BREAKFAST 03/23/18   Elayne Snare, MD  lidocaine (LIDODERM) 5 % Place 1 patch onto the skin daily as needed (pain). Remove & Discard patch within 12 hours or as directed by MD 02/27/18   Landis Martins, DPM  nortriptyline (PAMELOR) 10 MG capsule Start nortriptyline 10mg  at bedtime for 2 week, then increase to 2 tablet at bedtime 02/12/18   Patel, Donika K, DO  SYRINGE-NEEDLE, DISP, 3 ML (LUER LOCK SAFETY SYRINGES) 25G X 1" 3 ML MISC 1 Package by Does not apply route daily. 02/12/18   Narda Amber K, DO  tamsulosin (FLOMAX) 0.4 MG CAPS capsule Take 0.4 mg by mouth every morning.  08/31/17   [provider]  zolpidem (AMBIEN) 5 MG tablet Take 5 mg by mouth at bedtime as needed for sleep.    [provider]    Physical Exam: Vitals:   04/09/18 1829 04/09/18 1830 04/09/18 1930 04/09/18 1959  BP:  116/67 98/74 (!) 151/132  Pulse:  (!) 110 (!) 113 (!) 111  Resp:  20 (!) 25 20  Temp: (!) 103 F (39.4 C)   99.8 F (37.7 C)  TempSrc: Rectal   Oral  SpO2:  92% 95% 93%    GENERAL: 52 y.o.-year-old female patient, well-developed, well-nourished lying in the bed in no acute distress.  Awake, arousable, makes eye contact, follows some commands.  Is mumbling and grabbing at lines.   HEENT: Head atraumatic, normocephalic. Pupils equal. Mucus membranes dry. NECK: Supple, full range of motion. No JVD, no bruit heard. No thyroid enlargement, no tenderness, no cervical lymphadenopathy. CHEST:  Normal breath sounds  bilaterally. No wheezing, rales, rhonchi or crackles. No use of accessory muscles of respiration.  No reproducible chest wall tenderness.  CARDIOVASCULAR: S1, S2 normal. No murmurs, rubs, or gallops. Cap refill <2 seconds. Pulses intact distally.  ABDOMEN: Soft, nondistended, nontender. No rebound, guarding, rigidity. Normoactive bowel sounds present in all four quadrants.  EXTREMITIES: Right lower extremity erythema to midcalf.  Right foot with Charcot deformity, large area of swelling at the plantar surface with central ulceration that is draining serosanguinous material.  Left medial great toe has superficial skin lesion with peeling of the skin. No calf tenderness or Homan's sign.  NEUROLOGIC: The patient is altered, confused. SKIN: Otherwise warm, dry, and intact.    Labs on Admission:  CBC: Recent Labs  Lab 04/09/18 1828  WBC 30.3*  NEUTROABS 28.2*  HGB 13.0  HCT 39.1  MCV 95.1  PLT 417   Basic Metabolic Panel: Recent Labs  Lab 04/09/18 1828  NA 131*  K 4.6  CL 90*  CO2 25  GLUCOSE 385*  BUN 15  CREATININE 1.05*  CALCIUM 9.5   GFR: CrCl cannot be calculated (Unknown ideal weight.). Liver Function Tests: Recent Labs  Lab 04/09/18 1828  AST 16  ALT 16  ALKPHOS 110  BILITOT 0.3  PROT 7.6  ALBUMIN 3.3*   No results for input(s): LIPASE, AMYLASE in the last 168 hours. No results for input(s): AMMONIA in the last 168 hours. Coagulation Profile: No results for input(s): INR, PROTIME in the last 168 hours. Cardiac Enzymes: No results for input(s): CKTOTAL, CKMB, CKMBINDEX, TROPONINI in the last 168 hours. BNP (last 3 results) No results for input(s): PROBNP in the last 8760 hours. HbA1C: No results for input(s): HGBA1C in the last 72 hours. CBG: Recent Labs  Lab 04/09/18 1716  GLUCAP 527*   Lipid Profile: No results for input(s): CHOL, HDL, LDLCALC, TRIG, CHOLHDL, LDLDIRECT in the last 72 hours. Thyroid Function Tests: No results  for input(s): TSH, T4TOTAL, FREET4, T3FREE, THYROIDAB in the last 72 hours. Anemia Panel: No results for input(s): VITAMINB12, FOLATE, FERRITIN, TIBC, IRON, RETICCTPCT in the last 72 hours. Urine analysis:    Component Value Date/Time   COLORURINE YELLOW 04/09/2018 1828   APPEARANCEUR HAZY (A) 04/09/2018 1828   LABSPEC 1.022 04/09/2018 1828   PHURINE 6.0 04/09/2018 1828   GLUCOSEU >=500 (A) 04/09/2018 1828   GLUCOSEU >=1000 (A) 01/07/2015 1410   HGBUR NEGATIVE 04/09/2018 1828   BILIRUBINUR NEGATIVE 04/09/2018 1828   KETONESUR NEGATIVE 04/09/2018 1828   PROTEINUR NEGATIVE 04/09/2018 1828   UROBILINOGEN 0.2 04/23/2015 2355   NITRITE NEGATIVE 04/09/2018 1828   LEUKOCYTESUR SMALL (A) 04/09/2018 1828   Sepsis Labs: @LABRCNTIP (procalcitonin:4,lacticidven:4) )No results found for this or any previous visit (from the past 240 hour(s)).   Radiological Exams on Admission: Dg Chest 2 View  Result Date: 04/09/2018 CLINICAL DATA:  Altered mental status EXAM: CHEST - 2 VIEW COMPARISON:  01/30/2018 FINDINGS: Postoperative changes of the cervical spine. No acute airspace disease or pleural effusion. Heart size within normal limits. No pneumothorax. IMPRESSION: No active cardiopulmonary disease. Electronically Signed   By: Donavan Foil M.D.   On: 04/09/2018 19:24   Ct Head Wo Contrast  Result Date: 04/09/2018 CLINICAL DATA:  Altered LOC EXAM: CT HEAD WITHOUT CONTRAST TECHNIQUE: Contiguous axial images were obtained from the base of the skull through the vertex without intravenous contrast. COMPARISON:  09/09/2017 head CT FINDINGS: Brain: Mild motion degradation. No acute territorial infarction, hemorrhage or intracranial mass. Normal ventricle size. Vascular: No hyperdense  vessel or unexpected calcification. Skull: Normal. Negative for fracture or focal lesion. Sinuses/Orbits: No acute finding. Other: None IMPRESSION: Negative non contrasted CT appearance of the brain Electronically Signed   By: Donavan Foil M.D.   On: 04/09/2018 19:23   Dg Foot Complete Right  Result Date: 04/09/2018 CLINICAL DATA:  Ulcer to plantar surface of right foot with swelling and redness EXAM: RIGHT FOOT COMPLETE - 3+ VIEW COMPARISON:  MRI 03/17/2018 FINDINGS: Grossly abnormal appearance of the right foot with Charcot deformity of the midfoot. Lateral and dorsal dislocation at the Lisfranc joint with inferior displacement of the navicular, cuboid and cuneiform bones. Diffuse osteopenia. Moderate plantar calcaneal spur. Soft tissue swelling with large plantar soft tissue protuberance measuring 5.5 cm presumably corresponding to the history of ulcer, with small foci of soft tissue gas. IMPRESSION: 1. Gross deformity of the foot with Charcot deformity of the mid foot and chronic dorsal and lateral dislocation at the Lisfranc joint. Inferior subluxation of the navicular, cuboid and cuneiform bones; the deformity and bony destructive changes limit evaluation for acute osseous abnormality. 2. Soft tissue swelling with large plantar aspect ulcer Electronically Signed   By: Donavan Foil M.D.   On: 04/09/2018 19:30    Assessment/Plan  This is a 52 y.o. female with a history of DM, COPD, GERD, OSA , CVA, HLD and insomnia  now being admitted with:  #. AMS, Sepsis secondary to diabetic foot infection, rule out osteo - Admit to inpatient with telemetry monitoring - IV antibiotics: Zosyn, Vanc per pharmacy - IV fluid hydration - Follow up blood,urine & sputum cultures - Repeat CBC in am.  - MRI right foot ruleout osteo - Please call podiatry consult in AM - Check UDS given AMS - Neurochecks q4h - NPO  #. Hyperglycemia without DKA - NPO - Accuchecks q4h with RISS  Admission status: Inpatient IV Fluids: NS Diet/Nutrition: NPO for now Consults called: None  DVT Px: Lovenox, SCDs and early ambulation. Code Status: Full Code  Disposition Plan: To be determined.   All the records are reviewed and case discussed with ED  provider. Management plans discussed with the patient and/or family who express understanding and agree with plan of care.  Tamica Covell D.O. on 04/09/2018 at 8:51 PM CC: Primary care physician; Tamsen Roers, MD   04/09/2018, 8:51 PM

## 2018-04-09 NOTE — Progress Notes (Signed)
Pharmacy Antibiotic Note  [name] is a 52 y.o. female admitted on 04/09/2018 with altered mental status.  PMH signficant for diabetes, COPD, CVS, hyperlipidemia.  Patient has received initial doses of Vancomycin 2gm and Zosyn 3.375gm in the ED.  Pharmacy has been consulted for Vancomycin and Zosyn dosing for cellulitis (diabetic foot infection).  Plan: - Vancomycin 1500mg  IV q24h (Goal 400-500) - Zosyn 3.375gm IV q8h (each dose infused over 4 hrs) - Monitor daily SCr while on Vanc and Zosyn - Follow culture results and sensitivities - Check vancomycin levels as appropriate   Temp (24hrs), Avg:100.4 F (38 C), Min:98.2 F (36.8 C), Max:103 F (39.4 C)  Recent Labs  Lab 04/09/18 1828 04/09/18 1843 04/09/18 2057  WBC 30.3*  --   --   CREATININE 1.05*  --   --   LATICACIDVEN  --  3.40* 1.77    CrCl cannot be calculated (Unknown ideal weight.).    Allergies  Allergen Reactions  . Latex Itching, Rash and Other (See Comments)    Pt states she cannot use condoms - cause an infection.  Use of latex on skin is okay.  Tape causes rash  . Sweetening Enhancer [Flavoring Agent] Nausea And Vomiting and Other (See Comments)    HEADACHES  . Aspartame And Phenylalanine Nausea And Vomiting    HEADACHES  . Ibuprofen Other (See Comments)    HEADACHES  . Trazodone And Nefazodone Other (See Comments)    Hallucinations   . Triazolam Other (See Comments)    HALLUCINATIONS    Antimicrobials this admission: 4/15 Vanc >>   4/15 Zosyn >>    Dose adjustments this admission:    Microbiology results: 4/15 BCx: sent 4/15 UCx: sent   Thank you for allowing pharmacy to be a part of this patient's care.  Everette Rank, PharmD 04/09/2018 9:09 PM

## 2018-04-09 NOTE — ED Notes (Signed)
Lab reports they will add on urine drug screen.
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
## 2018-04-09 NOTE — ED Notes (Signed)
Family at bedside. 

## 2018-04-09 NOTE — ED Notes (Signed)
Elevated Lactic reported to Regenia Skeeter, MD and Frederico Hamman, [place]

## 2018-04-09 NOTE — ED Notes (Signed)
Patient transported to CT 

## 2018-04-09 NOTE — ED Notes (Signed)
Patient's family requesting to speak to MD. MD made aware.

## 2018-04-09 NOTE — ED Notes (Signed)
ED Provider at bedside. 

## 2018-04-09 NOTE — ED Notes (Signed)
ED TO INPATIENT HANDOFF REPORT  Name/Age/Gender [name] 52 y.o. female  Code Status Code Status History    Date Active Date Inactive Code Status Order ID Comments User Context   04/22/2015 1610 04/26/2015 1441 Full Code [id number]  Erroll Luna, MD Inpatient   02/12/2014 1730 02/14/2014 1856 Full Code [id number]  Charlynne Cousins, MD Inpatient   02/12/2014 1533 02/12/2014 1730 Full Code [id number]  Jerline Pain, MD Inpatient    Advance Directive Documentation     Most Recent Value  Type of Advance Directive  Healthcare Power of [place], Living will  Pre-existing out of facility DNR order (yellow form or pink MOST form)  -  "MOST" Form in Place?  -      Home/SNF/Other Home  Chief Complaint Altered Mental Status  Level of Care/Admitting Diagnosis ED Disposition    ED Disposition Condition [place]: [place] [100102]  Level of Care: Telemetry [5]  Admit to tele based on following criteria: Other see comments  Comments: Sepsis  Diagnosis: Sepsis due to cellulitis [facility]) [[id number]]  Admitting Physician: Harvie Bridge [[id number]]  Attending Physician: Harvie Bridge [[id number]]  Estimated length of stay: past midnight tomorrow  Certification:: I certify this patient will need inpatient services for at least 2 midnights  PT Class (Do Not Modify): Inpatient [101]  PT Acc Code (Do Not Modify): Private [1]       Medical History Past Medical History:  Diagnosis Date  . Anxiety   . Arthritis   . Bipolar disorder ([place])   . Broken jaw ([place])   . Broken wrist   . Chronic back pain   . Claudication ([place])    a. 12/2013 ABI's: R 0.97, L 0.94.  [name] COPD (chronic obstructive pulmonary disease) ([place])   . Depression   . Diabetic Charcot's foot ([place])   . Diabetic foot ulcer ([facility])    chronic left foot ulcer , great toe/  hx recurrent foot ulcer bilaterally  . DJD (degenerative joint disease)   . Edema of both lower extremities    . Episode of memory loss   . GERD (gastroesophageal reflux disease)   . Headache(784.0)   . Hiatal hernia   . History of colon cancer, stage I dx 02-26-2015--- oncologist-- dr [name]--- per last note no recurrence   05-16-2015 s/p  Laparoscopic sigmoid colectomy w/ node bx's (negative nodes per path)  Stage I (pT1,N0,M0) Grade 2  . 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   per MRI multiple small strokes post cardiac cath. --  residual mild memory loss  . History of methicillin resistant staphylococcus aureus (MRSA)   . Hypercholesterolemia   . Insomnia   . 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 Neurogenic bladder   . Neuropathy, diabetic ([place])    hands and feet  . OSA (obstructive sleep apnea)    cpap intolerant  . Personality disorder ([place])   . SOB (shortness of breath) on exertion   . Stroke ([place])   . SUI (stress urinary incontinence, female) S/P SLING 12-29-2011    Allergies Allergies  Allergen Reactions  . Latex Itching, Rash and Other (See Comments)    Pt states she cannot use condoms - cause an infection.  Use of  latex on skin is okay.  Tape causes rash  . Sweetening Enhancer [Flavoring Agent] Nausea And Vomiting and Other (See Comments)    HEADACHES  . Aspartame And Phenylalanine Nausea And Vomiting    HEADACHES  . Ibuprofen Other (See Comments)    HEADACHES  . Trazodone And Nefazodone Other (See Comments)    Hallucinations   . Triazolam Other (See Comments)    HALLUCINATIONS    IV Location/Drains/Wounds Patient Lines/Drains/Airways Status   Active Line/Drains/Airways    Name:   Placement date:   Placement time:   Site:   Days:   Peripheral IV 05/04/17 Right Antecubital   05/04/17    1619    Antecubital   340   Peripheral IV 10/19/17 Right Hand    10/19/17    0613    Hand   172   Peripheral IV 04/09/18 Left Antecubital   04/09/18    1830    Antecubital   less than 1   Peripheral IV 04/09/18 Left Forearm   04/09/18    1830    Forearm   less than 1   Incision (Closed) 04/22/15 Abdomen Other (Comment)   04/22/15    1148     1083   Incision (Closed) 09/05/16 Foot Right   09/05/16    1058     581   Incision (Closed) 02/06/18 Perineum Other (Comment)   02/06/18    0945     62   Incision - 4 Ports Abdomen 1: Right;Upper Right;Mid Right;Lower Left;Medial   04/22/15    0910     1083          Labs/Imaging Results for orders placed or performed during the hospital encounter of 04/09/18 (from the past 48 hour(s))  CBG monitoring, ED     Status: Abnormal   Collection Time: 04/09/18  5:16 PM  Result Value Ref Range   Glucose-Capillary 527 (HH) 65 - 99 mg/dL  Comprehensive metabolic panel     Status: Abnormal   Collection Time: 04/09/18  6:28 PM  Result Value Ref Range   Sodium 131 (L) 135 - 145 mmol/L   Potassium 4.6 3.5 - 5.1 mmol/L   Chloride 90 (L) 101 - 111 mmol/L   CO2 25 22 - 32 mmol/L   Glucose, Bld 385 (H) 65 - 99 mg/dL   BUN 15 6 - 20 mg/dL   Creatinine, Ser 1.05 (H) 0.44 - 1.00 mg/dL   Calcium 9.5 8.9 - 10.3 mg/dL   Total Protein 7.6 6.5 - 8.1 g/dL   Albumin 3.3 (L) 3.5 - 5.0 g/dL   AST 16 15 - 41 U/L   ALT 16 14 - 54 U/L   Alkaline Phosphatase 110 38 - 126 U/L   Total Bilirubin 0.3 0.3 - 1.2 mg/dL   GFR calc non Af Amer 60 (L) >60 mL/min   GFR calc Af Amer >60 >60 mL/min    Comment: (NOTE) The eGFR has been calculated using the CKD EPI equation. This calculation has not been validated in all clinical situations. eGFR's persistently <60 mL/min signify possible Chronic Kidney Disease.    Anion gap 16 (H) 5 - 15    Comment: Performed at [facility], [place] [address], [place], [place] [address]  CBC WITH DIFFERENTIAL     Status: Abnormal   Collection Time: 04/09/18  6:28 PM  Result Value Ref Range    WBC 30.3 (H) 4.0 - 10.5 K/uL   RBC 4.11 3.87 - 5.11 MIL/uL  Hemoglobin 13.0 12.0 - 15.0 g/dL   HCT 39.1 36.0 - 46.0 %   MCV 95.1 78.0 - 100.0 fL   MCH 31.6 26.0 - 34.0 pg   MCHC 33.2 30.0 - 36.0 g/dL   RDW 13.9 11.5 - 15.5 %   Platelets 261 150 - 400 K/uL   Neutrophils Relative % 93 %   Lymphocytes Relative 2 %   Monocytes Relative 5 %   Eosinophils Relative 0 %   Basophils Relative 0 %   Neutro Abs 28.2 (H) 1.7 - 7.7 K/uL   Lymphs Abs 0.6 (L) 0.7 - 4.0 K/uL   Monocytes Absolute 1.5 (H) 0.1 - 1.0 K/uL   Eosinophils Absolute 0.0 0.0 - 0.7 K/uL   Basophils Absolute 0.0 0.0 - 0.1 K/uL   WBC Morphology WHITE COUNT CONFIRMED ON SMEAR    Smear Review MORPHOLOGY UNREMARKABLE     Comment: Performed at [facility], [place] [address], [place], [place] [address]  Urinalysis, Routine w reflex microscopic     Status: Abnormal   Collection Time: 04/09/18  6:28 PM  Result Value Ref Range   Color, Urine YELLOW YELLOW   APPearance HAZY (A) CLEAR   Specific Gravity, Urine 1.022 1.005 - 1.030   pH 6.0 5.0 - 8.0   Glucose, UA >=500 (A) NEGATIVE mg/dL   Hgb urine dipstick NEGATIVE NEGATIVE   Bilirubin Urine NEGATIVE NEGATIVE   Ketones, ur NEGATIVE NEGATIVE mg/dL   Protein, ur NEGATIVE NEGATIVE mg/dL   Nitrite NEGATIVE NEGATIVE   Leukocytes, UA SMALL (A) NEGATIVE   RBC / HPF 0-5 0 - 5 RBC/hpf   WBC, UA TOO NUMEROUS TO COUNT 0 - 5 WBC/hpf   Bacteria, UA RARE (A) NONE SEEN   Squamous Epithelial / LPF 6-30 (A) NONE SEEN    Comment: Performed at [facility], [place] [address], [place], [place] [address]  Urine rapid drug screen (hosp performed)     Status: None   Collection Time: 04/09/18  6:28 PM  Result Value Ref Range   Opiates NONE DETECTED NONE DETECTED   Cocaine NONE DETECTED NONE DETECTED   Benzodiazepines NONE DETECTED NONE DETECTED   Amphetamines NONE DETECTED NONE DETECTED   Tetrahydrocannabinol NONE DETECTED NONE DETECTED   Barbiturates NONE  DETECTED NONE DETECTED    Comment: (NOTE) DRUG SCREEN FOR MEDICAL PURPOSES ONLY.  IF CONFIRMATION IS NEEDED FOR ANY PURPOSE, NOTIFY LAB WITHIN 5 DAYS. LOWEST DETECTABLE LIMITS FOR URINE DRUG SCREEN Drug Class                     Cutoff (ng/mL) Amphetamine and metabolites    1000 Barbiturate and metabolites    200 Benzodiazepine                 616 Tricyclics and metabolites     300 Opiates and metabolites        300 Cocaine and metabolites        300 THC                            50 Performed at [facility], [place] [address] [place], [place] [address]   Blood gas, venous ([facility], [facility], [facility])     Status: Abnormal   Collection Time: 04/09/18  6:40 PM  Result Value Ref Range   FIO2 21.00    Delivery systems ROOM AIR    pH, Ven 7.416 7.250 - 7.430  pCO2, Ven 47.0 44.0 - 60.0 mmHg   pO2, Ven 32.6 32.0 - 45.0 mmHg   Bicarbonate 29.6 (H) 20.0 - 28.0 mmol/L   Acid-Base Excess 4.8 (H) 0.0 - 2.0 mmol/L   O2 Saturation 57.9 %   Patient temperature 98.6    Collection site VEIN    Drawn by RN    Sample type VEIN     Comment: Performed at [facility], [place] [address], [place], [place] [address]  I-Stat CG4 Lactic Acid, ED  (not at  [facility])     Status: Abnormal   Collection Time: 04/09/18  6:43 PM  Result Value Ref Range   Lactic Acid, Venous 3.40 (HH) 0.5 - 1.9 mmol/L   Comment NOTIFIED PHYSICIAN   I-Stat CG4 Lactic Acid, ED  (not at  [facility])     Status: None   Collection Time: 04/09/18  8:57 PM  Result Value Ref Range   Lactic Acid, Venous 1.77 0.5 - 1.9 mmol/L   Dg Chest 2 View  Result Date: 04/09/2018 CLINICAL DATA:  Altered mental status EXAM: CHEST - 2 VIEW COMPARISON:  01/30/2018 FINDINGS: Postoperative changes of the cervical spine. No acute airspace disease or pleural effusion. Heart size within normal limits. No pneumothorax. IMPRESSION: No active cardiopulmonary disease. Electronically Signed   By: Donavan Foil M.D.   On: 04/09/2018 19:24    Ct Head Wo Contrast  Result Date: 04/09/2018 CLINICAL DATA:  Altered LOC EXAM: CT HEAD WITHOUT CONTRAST TECHNIQUE: Contiguous axial images were obtained from the base of the skull through the vertex without intravenous contrast. COMPARISON:  09/09/2017 head CT FINDINGS: Brain: Mild motion degradation. No acute territorial infarction, hemorrhage or intracranial mass. Normal ventricle size. Vascular: No hyperdense vessel or unexpected calcification. Skull: Normal. Negative for fracture or focal lesion. Sinuses/Orbits: No acute finding. Other: None IMPRESSION: Negative non contrasted CT appearance of the brain Electronically Signed   By: Donavan Foil M.D.   On: 04/09/2018 19:23   Dg Foot Complete Right  Result Date: 04/09/2018 CLINICAL DATA:  Ulcer to plantar surface of right foot with swelling and redness EXAM: RIGHT FOOT COMPLETE - 3+ VIEW COMPARISON:  MRI 03/17/2018 FINDINGS: Grossly abnormal appearance of the right foot with Charcot deformity of the midfoot. Lateral and dorsal dislocation at the Lisfranc joint with inferior displacement of the navicular, cuboid and cuneiform bones. Diffuse osteopenia. Moderate plantar calcaneal spur. Soft tissue swelling with large plantar soft tissue protuberance measuring 5.5 cm presumably corresponding to the history of ulcer, with small foci of soft tissue gas. IMPRESSION: 1. Gross deformity of the foot with Charcot deformity of the mid foot and chronic dorsal and lateral dislocation at the Lisfranc joint. Inferior subluxation of the navicular, cuboid and cuneiform bones; the deformity and bony destructive changes limit evaluation for acute osseous abnormality. 2. Soft tissue swelling with large plantar aspect ulcer Electronically Signed   By: Donavan Foil M.D.   On: 04/09/2018 19:30    Pending Labs Unresulted Labs (From admission, onward)   Start     Ordered   04/10/18 0500  Creatinine, serum  Daily,   R     04/09/18 2120   04/09/18 1747  Blood Culture  (routine x 2)  BLOOD CULTURE X 2,   STAT     04/09/18 1746   04/09/18 1747  Urine culture  STAT,   STAT     04/09/18 1746   Signed and Held  Lactic acid, plasma  STAT Now then every 3 hours,  STAT     Signed and Held   Signed and Held  CBC  (enoxaparin (LOVENOX)    CrCl >/= 30 ml/min)  Once,   R    Comments:  Baseline for enoxaparin therapy IF NOT ALREADY DRAWN.  Notify MD if PLT < 100 K.    Signed and Held   Signed and Held  Creatinine, serum  (enoxaparin (LOVENOX)    CrCl >/= 30 ml/min)  Once,   R    Comments:  Baseline for enoxaparin therapy IF NOT ALREADY DRAWN.    Signed and Held   Signed and Held  Creatinine, serum  (enoxaparin (LOVENOX)    CrCl >/= 30 ml/min)  Weekly,   R    Comments:  while on enoxaparin therapy    Signed and Held   Signed and Held  HIV antibody (Routine Testing)  Once,   R     Signed and Held   Signed and Held  CBC  Tomorrow morning,   R     Signed and Held   Signed and Held  Comprehensive metabolic panel  Tomorrow morning,   R     Signed and Held      Vitals/Pain Today's Vitals   04/09/18 2053 04/09/18 2100 04/09/18 2149 04/09/18 2200  BP:  96/85 97/63 105/61  Pulse:  97 96 94  Resp:  '16 16 20  ' Temp:      TempSrc:      SpO2:  95% 93% 94%  PainSc: Asleep       Isolation Precautions No active isolations  Medications Medications  vancomycin (VANCOCIN) 1,500 mg in sodium chloride 0.9 % 500 mL IVPB (has no administration in time range)  piperacillin-tazobactam (ZOSYN) IVPB 3.375 g (has no administration in time range)  sodium chloride 0.9 % bolus 1,000 mL (0 mLs Intravenous Stopped 04/09/18 1932)  piperacillin-tazobactam (ZOSYN) IVPB 3.375 g (0 g Intravenous Stopped 04/09/18 1932)  acetaminophen (TYLENOL) tablet 650 mg (650 mg Oral Given 04/09/18 1837)  vancomycin (VANCOCIN) 2,000 mg in sodium chloride 0.9 % 500 mL IVPB (0 mg Intravenous Stopped 04/09/18 2150)  LORazepam (ATIVAN) injection 1 mg (1 mg Intravenous Given 04/09/18 1939)  sodium  chloride 0.9 % bolus 1,000 mL (1,000 mLs Intravenous New Bag/Given 04/09/18 2050)    Mobility walks with device

## 2018-04-09 NOTE — ED Notes (Signed)
Respiratory made aware of VBG. 

## 2018-04-09 NOTE — Progress Notes (Signed)
PCP - Dr. Tamsen Roers, [place] Cardiologist - Patient has seen Dr. Marlou Porch in the past for a follow-up after Cardiac cath in 01/2014, not currently followed by cardiologist  Chest x-ray - n/a EKG - 09/09/2017 Stress Test - 01/09/2014 ECHO - 02/13/2014 Cardiac Cath - 01/2014  Sleep Study - Patient's husband states that patient has undergone a sleep study and was diagnosed with OSA, but not sure of when. CPAP - no, patient does not tolerate CPAP  Fasting Blood Sugar - 300-500, depending on what patient drinks and eats during the night Checks Blood Sugar 4 times a day   Anesthesia review: yes, A1C 9.2 on 03/19/2018  Patient called stating her foot was hurting and she was unable to make her PAT appointment.  I spoke with the patient's husband [name]) over the phone, went over the patient's medical history and information for the day of surgery.  Patient denies shortness of breath, fever, cough and chest pain at the time of the phone call.  Instructed patient to stop taking any Aspirin (unless otherwise instructed by your surgeon), Aleve, Naproxen, Ibuprofen, Motrin, Advil, Goody's, BC's, all herbal medications, fish oil, and all vitamins.  Patient instructed to take tamsulosin the DOS, as well as 18 units (1/2 dose) of Insulin Glargine (Lantus), and if needed take Flexeril and Nexium.  Patient's husband verbalized understanding of instructions that were given to them.   Several attempts were made to get H&P from Dr. Rex Kras, [phone], but no answer.

## 2018-04-09 NOTE — Progress Notes (Signed)
A consult was received from an ED physician for Vancomycin and Zosyn per pharmacy dosing.  The patient's profile has been reviewed for ht/wt/allergies/indication/available labs.   A one time order has been placed for Vancomycin 2gm IV x 1 and an order for Zosyn 3.375gm IV x 1 has already been placed.  Further antibiotics/pharmacy consults should be ordered by admitting physician if indicated.                       Thank you, Everette Rank, PharmD 04/09/2018  6:16 PM

## 2018-04-09 NOTE — ED Notes (Signed)
Bed: OB09 Expected date:  Expected time:  Means of arrival:  Comments: EMS-strange behavior

## 2018-04-09 NOTE — ED Notes (Signed)
Hospitalist at bedside 

## 2018-04-09 NOTE — ED Provider Notes (Signed)
[facility] DEPT Provider Note   CSN: [id number] Arrival date & time: 04/09/18  1644  LEVEL 5 CAVEAT - ALTERED MENTAL STATUS   History   Chief Complaint Chief Complaint  Patient presents with  . Altered Mental Status  . Hyperglycemia    HPI [name] is a 52 y.o. female.  HPI  52 year old female with a history of poorly controlled diabetes, COPD, chronic back pain, diabetic neuropathy and prior stroke presents with altered mental status.  She was put on Hetlioz about 2 weeks ago.  Husband thinks this is causing her change in mental status.  About 5 days or so after she started having nightmares.  Over the last 3 days she has been confused and having odd behavior as well as talking to people that are not there.  She was found to have a fever upon checking in here.  She has a chronic cough but does not wear oxygen at home.  She has chronic urinary frequency.  She has been having a right plantar foot wound that has been draining pus according to the husband over the last 3 or 4 days.  Today he noticed redness along her right lower leg.  Past Medical History:  Diagnosis Date  . Anxiety   . Arthritis   . Bipolar disorder ([place])   . Broken jaw ([place])   . Broken wrist   . Chronic back pain   . Claudication ([place])    a. 12/2013 ABI's: R 0.97, L 0.94.  [name] COPD (chronic obstructive pulmonary disease) ([place])   . Depression   . Diabetic Charcot's foot ([place])   . Diabetic foot ulcer ([facility])    chronic left foot ulcer , great toe/  hx recurrent foot ulcer bilaterally  . DJD (degenerative joint disease)   . Edema of both lower extremities   . Episode of memory loss   . GERD (gastroesophageal reflux disease)   . Headache(784.0)   . Hiatal hernia   . History of colon cancer, stage I dx 02-26-2015--- oncologist-- dr [name]--- per last note no recurrence   05-16-2015 s/p  Laparoscopic sigmoid colectomy w/ node bx's (negative nodes per path)  Stage I (pT1,N0,M0) Grade  2  . 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   per MRI multiple small strokes post cardiac cath. --  residual mild memory loss  . History of methicillin resistant staphylococcus aureus (MRSA)   . Hypercholesterolemia   . Insomnia   . Insulin dependent type 2 diabetes mellitus, uncontrolled ([place]) dx 2004   endocrinologist-  dr Dwyane Dee-- last A1c 8.8 in Aug2018:  pt is noncompliant w/ diet, states does note eat breakfast , her first main meal in afternoon  . Neurogenic bladder   . Neuropathy, diabetic ([place])    hands and feet  . OSA (obstructive sleep apnea)    cpap intolerant  . Personality disorder ([place])   . SOB (shortness of breath) on exertion   . Stroke ([place])   . SUI (stress urinary incontinence, female) S/P SLING 12-29-2011    Patient Active Problem List   Diagnosis Date Noted  . Cancer of sigmoid colon ([place]) 03/09/2015  . OSA (obstructive sleep apnea) 07/04/2014  . Migraine without aura, with intractable migraine, so stated, without mention of status migrainosus 03/26/2014  . Peripheral neuropathy 03/03/2014  . Abnormal stress test 02/12/2014  . CVA (cerebral infarction) 02/12/2014  . Chest pain   . Abnormal heart rhythm   . Edema   .  Hypertension   . Hyperlipemia   . Hypercholesterolemia   . Other and unspecified hyperlipidemia 11/14/2013  . Type II diabetes mellitus, uncontrolled ([place]) 07/22/2013  . Essential hypertension 07/22/2013  . 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 Diabetic polyneuropathy ([place]) 07/22/2013    Past Surgical History:  Procedure Laterality Date  . ABDOMINAL HYSTERECTOMY    . ANTERIOR CERVICAL DECOMP/DISCECTOMY FUSION  2000   C5 - 7  . CARDIAC CATHETERIZATION  05-22-2008   DR SKAINS   NO SIGNIFECANT CAD/ NORMAL LV/  EF 65%/  NO WALL MOTION ABNORMALITIES  . CARPAL TUNNEL RELEASE Right 04-25-2013  . [place]; 1992  . COLONOSCOPY    .  CYSTO N/A 04/30/2013   Procedure: CYSTOSCOPY;  Surgeon: Reece Packer, MD;  Location: [facility];  Service: Urology;  Laterality: N/A;  . CYSTOSCOPY MACROPLASTIQUE IMPLANT N/A 02/06/2018   Procedure: CYSTOSCOPY MACROPLASTIQUE IMPLANT;  Surgeon: Bjorn Loser, MD;  Location[facility]  Service: Urology;  Laterality: N/A;  . CYSTOSCOPY WITH INJECTION  05/04/2012   Procedure: CYSTOSCOPY WITH INJECTION;  Surgeon: Reece Packer, MD;  Location: [facility];  Service: Urology;  Laterality: N/A;  MACROPLASTIQUE INJECTION  . CYSTOSCOPY WITH INJECTION  08/28/2012   Procedure: CYSTOSCOPY WITH INJECTION;  Surgeon: Reece Packer, MD;  Location[facility];  Service: Urology;  Laterality: N/A;  cysto and macroplastique   . FOOT SURGERY Bilateral   . HERNIA REPAIR  ?1996   "stomach"  . KNEE ARTHROSCOPY W/ ALLOGRAFT IMPANT Left    graft x 2  . KNEE SURGERY     TOTAL 8 SURG'S  . LAPAROSCOPIC SIGMOID COLECTOMY N/A 04/22/2015   Procedure: LAPAROSCOPIC HAND ASSISTED SIGMOID COLECTOMY;  Surgeon: Erroll Luna, MD;  Location: [facility];  Service: General;  Laterality: N/A;  . LEFT HEART CATHETERIZATION WITH CORONARY ANGIOGRAM N/A 02/12/2014   Procedure: LEFT HEART CATHETERIZATION WITH CORONARY ANGIOGRAM;  Surgeon: Candee Furbish, MD;  Location: [facility] CATH LAB;  Service: Cardiovascular;  Laterality: N/A;   No angiographically significant CAD; normal LVSF, LVEDP 36mmHg,  EF 55% (new finding ef 30% myoview 01-08-2014)  . LUMBAR FUSION    . MANDIBLE FRACTURE SURGERY    . MULTIPLE LAPAROSCOPIES FOR ENDOMETRIOSIS    . PUBOVAGINAL SLING  12/29/2011   Procedure: Gaynelle Arabian;  Surgeon: Reece Packer, MD;  Location: [facility];  Service: Urology;  Laterality: N/A;  cysto and sparc sling   . PUBOVAGINAL SLING N/A 04/30/2013   Procedure: REMOVAL OF VAGINAL MESH;  Surgeon: Reece Packer, MD;  Location: [facility];  Service: Urology;  Laterality: N/A;  .  RECONSTURCTION OF CONGENITAL UTERUS ANOMALY  1983  . REPEAT RECONSTRUCTION ACL LEFT KNEE/ SCREWS REMOVED  03-28-2000   CADAVER GRAFT  . TOTAL ABDOMINAL HYSTERECTOMY W/ BILATERAL SALPINGOOPHORECTOMY  1997  . TRANSTHORACIC ECHOCARDIOGRAM  02/13/2014   ef 45%, hypokinesis base inferior and base inferolateral walls  . UPPER GI ENDOSCOPY    . WOUND DEBRIDEMENT Right 09/05/2016   Procedure: DEBRIDEMENT WOUND WITH GRAFT RIGHT FOOT;  Surgeon: Landis Martins, DPM;  Location: [facility];  Service: Podiatry;  Laterality: Right;  . WRIST FRACTURE SURGERY       OB History   None      Home Medications    Prior to Admission medications   Medication Sig Start Date End Date Taking? Authorizing Provider  cyclobenzaprine (FLEXERIL) 10 MG tablet Take 1 tablet (10 mg  total) by mouth 3 (three) times daily as needed for muscle spasms. 02/27/18   Landis Martins, DPM  empagliflozin (JARDIANCE) 25 MG TABS tablet Take 25 mg by mouth daily. 02/02/18   Elayne Snare, MD  esomeprazole (NEXIUM) 20 MG capsule Take 20 mg by mouth daily as needed.     [provider]  gabapentin (NEURONTIN) 600 MG tablet TAKE 1 TABLET (600 MG TOTAL) BY MOUTH 2 (TWO) TIMES DAILY. Patient taking differently: Take 600 mg by mouth 3 (three) times daily.  01/24/18   Patel, Arvin Collard K, DO  glucose blood (FREESTYLE LITE) test strip Use to check blood sugars up to 4 times daily. Dx Code E11.65 03/16/18   Elayne Snare, MD  insulin aspart (NOVOLOG) 100 UNIT/ML FlexPen Take 10 units before your main meal. If blood sugars are consistently over 250 after the meal start taking 14 units before your main meal 35 UNITS AT SUPPER 04/03/18   Elayne Snare, MD  Insulin Glargine (BASAGLAR KWIKPEN) 100 UNIT/ML SOPN Inject 0.36 mLs (36 Units total) into the skin daily before breakfast. And pen needles 1/day 12/11/17   Elayne Snare, MD  INVOKANA 300 MG TABS tablet TAKE 1 TABLET BY MOUTH EVERY DAY BEFORE BREAKFAST 03/23/18   Elayne Snare, MD  lidocaine (LIDODERM) 5 % Place  1 patch onto the skin daily as needed (pain). Remove & Discard patch within 12 hours or as directed by MD 02/27/18   Landis Martins, DPM  nortriptyline (PAMELOR) 10 MG capsule Start nortriptyline 10mg  at bedtime for 2 week, then increase to 2 tablet at bedtime 02/12/18   Patel, Donika K, DO  SYRINGE-NEEDLE, DISP, 3 ML (LUER LOCK SAFETY SYRINGES) 25G X 1" 3 ML MISC 1 Package by Does not apply route daily. 02/12/18   Narda Amber K, DO  tamsulosin (FLOMAX) 0.4 MG CAPS capsule Take 0.4 mg by mouth every morning.  08/31/17   [provider]  zolpidem (AMBIEN) 5 MG tablet Take 5 mg by mouth at bedtime as needed for sleep.    [provider]    Family History Family History  Problem Relation Age of Onset  . Hypertension Mother   . Diabetes Mother   . Cancer - Other Mother        lymphoma   . Cancer - Other Father        lung, bladder cancer   . Heart attack Father   . Cancer - Other Brother        bladder cancer     Social History Social History   Tobacco Use  . Smoking status: Current Every Day Smoker    Packs/day: 4.00    Years: 45.00    Pack years: 180.00    Types: Cigarettes  . Smokeless tobacco: Never Used  . Tobacco comment: per pt started smoking  age 64  Substance Use Topics  . Alcohol use: No    Alcohol/week: 0.0 oz  . Drug use: No     Allergies   Latex; Sweetening enhancer [flavoring agent]; Aspartame and phenylalanine; Ibuprofen; Trazodone and nefazodone; and Triazolam   Review of Systems Review of Systems  Unable to perform ROS: Mental status change     Physical Exam Updated Vital Signs BP 116/67   Pulse (!) 110   Temp (!) 103 F (39.4 C) (Rectal)   Resp 20   SpO2 92%   Physical Exam  Constitutional: She appears well-developed and well-nourished.  Morbidly obese  HENT:  Head: Normocephalic and atraumatic.  Right Ear: External  ear normal.  Left Ear: External ear normal.  Nose: Nose normal.  Eyes: Right eye exhibits no discharge. Left  eye exhibits no discharge.  Neck: Neck supple. No neck rigidity.  Cardiovascular: Regular rhythm and normal heart sounds. Tachycardia present.  Pulmonary/Chest: Effort normal and breath sounds normal. No accessory muscle usage. She has no wheezes. She has no rales.  Abdominal: Soft. There is no tenderness.  Musculoskeletal:  See picture. Erythema with warmth to RLE anteriorly. Right foot swollen, especially plantar with some drainage  Neurological: She is alert. She is disoriented.  Skin: Skin is warm and dry. There is erythema.  Nursing note and vitals reviewed.        ED Treatments / Results  Labs (all labs ordered are listed, but only abnormal results are displayed) Labs Reviewed  COMPREHENSIVE METABOLIC PANEL - Abnormal; Notable for the following components:      Result Value   Sodium 131 (*)    Chloride 90 (*)    Glucose, Bld 385 (*)    Creatinine, Ser 1.05 (*)    Albumin 3.3 (*)    GFR calc non Af Amer 60 (*)    Anion gap 16 (*)    All other components within normal limits  CBC WITH DIFFERENTIAL/PLATELET - Abnormal; Notable for the following components:   WBC 30.3 (*)    Neutro Abs 28.2 (*)    Lymphs Abs 0.6 (*)    Monocytes Absolute 1.5 (*)    All other components within normal limits  URINALYSIS, ROUTINE W REFLEX MICROSCOPIC - Abnormal; Notable for the following components:   APPearance HAZY (*)    Glucose, UA >=500 (*)    Leukocytes, UA SMALL (*)    Bacteria, UA RARE (*)    Squamous Epithelial / LPF 6-30 (*)    All other components within normal limits  BLOOD GAS, VENOUS - Abnormal; Notable for the following components:   Bicarbonate 29.6 (*)    Acid-Base Excess 4.8 (*)    All other components within normal limits  GLUCOSE, CAPILLARY - Abnormal; Notable for the following components:   Glucose-Capillary 126 (*)    All other components within normal limits  CBG MONITORING, ED - Abnormal; Notable for the following components:   Glucose-Capillary 527 (*)     All other components within normal limits  I-STAT CG4 LACTIC ACID, ED - Abnormal; Notable for the following components:   Lactic Acid, Venous 3.40 (*)    All other components within normal limits  CULTURE, BLOOD (ROUTINE X 2)  CULTURE, BLOOD (ROUTINE X 2)  URINE CULTURE  RAPID URINE DRUG SCREEN, HOSP PERFORMED  LACTIC ACID, PLASMA  LACTIC ACID, PLASMA  HIV ANTIBODY (ROUTINE TESTING)  CBC  COMPREHENSIVE METABOLIC PANEL  I-STAT CG4 LACTIC ACID, ED    EKG None  Radiology Dg Chest 2 View  Result Date: 04/09/2018 CLINICAL DATA:  Altered mental status EXAM: CHEST - 2 VIEW COMPARISON:  01/30/2018 FINDINGS: Postoperative changes of the cervical spine. No acute airspace disease or pleural effusion. Heart size within normal limits. No pneumothorax. IMPRESSION: No active cardiopulmonary disease. Electronically Signed   By: Donavan Foil M.D.   On: 04/09/2018 19:24   Ct Head Wo Contrast  Result Date: 04/09/2018 CLINICAL DATA:  Altered LOC EXAM: CT HEAD WITHOUT CONTRAST TECHNIQUE: Contiguous axial images were obtained from the base of the skull through the vertex without intravenous contrast. COMPARISON:  09/09/2017 head CT FINDINGS: Brain: Mild motion degradation. No acute territorial infarction, hemorrhage or intracranial mass. Normal  ventricle size. Vascular: No hyperdense vessel or unexpected calcification. Skull: Normal. Negative for fracture or focal lesion. Sinuses/Orbits: No acute finding. Other: None IMPRESSION: Negative non contrasted CT appearance of the brain Electronically Signed   By: Donavan Foil M.D.   On: 04/09/2018 19:23   Dg Foot Complete Right  Result Date: 04/09/2018 CLINICAL DATA:  Ulcer to plantar surface of right foot with swelling and redness EXAM: RIGHT FOOT COMPLETE - 3+ VIEW COMPARISON:  MRI 03/17/2018 FINDINGS: Grossly abnormal appearance of the right foot with Charcot deformity of the midfoot. Lateral and dorsal dislocation at the Lisfranc joint with inferior  displacement of the navicular, cuboid and cuneiform bones. Diffuse osteopenia. Moderate plantar calcaneal spur. Soft tissue swelling with large plantar soft tissue protuberance measuring 5.5 cm presumably corresponding to the history of ulcer, with small foci of soft tissue gas. IMPRESSION: 1. Gross deformity of the foot with Charcot deformity of the mid foot and chronic dorsal and lateral dislocation at the Lisfranc joint. Inferior subluxation of the navicular, cuboid and cuneiform bones; the deformity and bony destructive changes limit evaluation for acute osseous abnormality. 2. Soft tissue swelling with large plantar aspect ulcer Electronically Signed   By: Donavan Foil M.D.   On: 04/09/2018 19:30    Procedures .Critical Care Performed by: Sherwood Gambler, MD Authorized by: Sherwood Gambler, MD   Critical care provider statement:    Critical care time (minutes):  35   Critical care time was exclusive of:  Separately billable procedures and treating other patients   Critical care was necessary to treat or prevent imminent or life-threatening deterioration of the following conditions:  Sepsis and shock   Critical care was time spent personally by me on the following activities:  Development of treatment plan with patient or surrogate, discussions with consultants, evaluation of patient's response to treatment, obtaining history from patient or surrogate, ordering and performing treatments and interventions, ordering and review of laboratory studies, ordering and review of radiographic studies, pulse oximetry, re-evaluation of patient's condition and review of old charts   (including critical care time)  Medications Ordered in ED Medications  sodium chloride 0.9 % bolus 1,000 mL (1,000 mLs Intravenous New Bag/Given 04/09/18 1831)  piperacillin-tazobactam (ZOSYN) IVPB 3.375 g (3.375 g Intravenous New Bag/Given 04/09/18 1837)  vancomycin (VANCOCIN) 2,000 mg in sodium chloride 0.9 % 500 mL IVPB (has  no administration in time range)  LORazepam (ATIVAN) injection 1 mg (has no administration in time range)  acetaminophen (TYLENOL) tablet 650 mg (650 mg Oral Given 04/09/18 1837)     Initial Impression / Assessment and Plan / ED Course  I have reviewed the triage vital signs and the nursing notes.  Pertinent labs & imaging results that were available during my care of the patient were reviewed by me and considered in my medical decision making (see chart for details).     Patient's presentation is consistent with severe sepsis.  She has not had hypotension and her lactate has been around 3.  She was given IV fluids.  Most likely source is the right lower extremity with obvious cellulitis but also a wound to the plantar aspect of her foot.  This appears to be draining.  She had an MRI a few weeks ago that showed no obvious osteo-but did show chronic Lisfranc changes and a nonspecific fluid collection.  This may need to be operated on.  She was given vancomycin and Zosyn.  Her mental status changes are probably a combination of medicines but also  the fever.  My suspicion for a CNS infection is low given other sources that are more likely.  I do not think LP warranted at this time.  She will be admitted to the hospitalist service.  Final Clinical Impressions(s) / ED Diagnoses   Final diagnoses:  Severe sepsis [facility])    ED Discharge Orders    None       Sherwood Gambler, MD 04/10/18 [phone]

## 2018-04-09 NOTE — ED Triage Notes (Addendum)
Per EMS, patient from home, where husband reports patient has been "acting strange" since starting new medication, hetlioz, x13 days ago. Patient's husband reports flushing remaining medications and not giving them to patient. Patient's son expresses concern patient has been taking too much medication. A&Ox2. Chewing on cords and blankets with EMS.  CBG HI

## 2018-04-10 ENCOUNTER — Telehealth: Payer: Self-pay | Admitting: *Deleted

## 2018-04-10 ENCOUNTER — Inpatient Hospital Stay (HOSPITAL_COMMUNITY): Payer: Medicare Other

## 2018-04-10 ENCOUNTER — Ambulatory Visit: Payer: Medicare Other | Admitting: Sports Medicine

## 2018-04-10 LAB — COMPREHENSIVE METABOLIC PANEL
ALK PHOS: 87 U/L (ref 38–126)
ALT: 11 U/L — ABNORMAL LOW (ref 14–54)
ANION GAP: 10 (ref 5–15)
AST: 10 U/L — AB (ref 15–41)
Albumin: 2.4 g/dL — ABNORMAL LOW (ref 3.5–5.0)
BUN: 14 mg/dL (ref 6–20)
CALCIUM: 8.1 mg/dL — AB (ref 8.9–10.3)
CO2: 25 mmol/L (ref 22–32)
Chloride: 97 mmol/L — ABNORMAL LOW (ref 101–111)
Creatinine, Ser: 0.84 mg/dL (ref 0.44–1.00)
GFR calc Af Amer: >60 mL/min (ref >60)
GFR calc non Af Amer: >60 mL/min (ref >60)
GLUCOSE: 166 mg/dL — AB (ref 65–99)
POTASSIUM: 3.8 mmol/L (ref 3.5–5.1)
SODIUM: 132 mmol/L — AB (ref 135–145)
Total Bilirubin: 0.5 mg/dL (ref 0.3–1.2)
Total Protein: 6 g/dL — ABNORMAL LOW (ref 6.5–8.1)

## 2018-04-10 LAB — BLOOD CULTURE ID PANEL (REFLEXED)
ACINETOBACTER BAUMANNII: NOT DETECTED
CANDIDA GLABRATA: NOT DETECTED
CANDIDA KRUSEI: NOT DETECTED
CANDIDA TROPICALIS: NOT DETECTED
Candida albicans: NOT DETECTED
Candida parapsilosis: NOT DETECTED
ENTEROBACTER CLOACAE COMPLEX: NOT DETECTED
ESCHERICHIA COLI: NOT DETECTED
Enterobacteriaceae species: NOT DETECTED
Enterococcus species: NOT DETECTED
Haemophilus influenzae: NOT DETECTED
KLEBSIELLA PNEUMONIAE: NOT DETECTED
Klebsiella oxytoca: NOT DETECTED
Listeria monocytogenes: NOT DETECTED
Neisseria meningitidis: NOT DETECTED
PROTEUS SPECIES: NOT DETECTED
PSEUDOMONAS AERUGINOSA: NOT DETECTED
STAPHYLOCOCCUS SPECIES: NOT DETECTED
STREPTOCOCCUS PNEUMONIAE: NOT DETECTED
Serratia marcescens: NOT DETECTED
Staphylococcus aureus (BCID): NOT DETECTED
Streptococcus agalactiae: DETECTED — AB
Streptococcus pyogenes: NOT DETECTED
Streptococcus species: DETECTED — AB

## 2018-04-10 LAB — CBC
HCT: 32 % — ABNORMAL LOW (ref 36.0–46.0)
HEMOGLOBIN: 10.5 g/dL — AB (ref 12.0–15.0)
MCH: 31.5 pg (ref 26.0–34.0)
MCHC: 32.8 g/dL (ref 30.0–36.0)
MCV: 96.1 fL (ref 78.0–100.0)
Platelets: 221 10*3/uL (ref 150–400)
RBC: 3.33 MIL/uL — ABNORMAL LOW (ref 3.87–5.11)
RDW: 14.1 % (ref 11.5–15.5)
WBC: 22.8 10*3/uL — AB (ref 4.0–10.5)

## 2018-04-10 LAB — GLUCOSE, CAPILLARY
GLUCOSE-CAPILLARY: 143 mg/dL — AB (ref 65–99)
GLUCOSE-CAPILLARY: 213 mg/dL — AB (ref 65–99)
Glucose-Capillary: 146 mg/dL — ABNORMAL HIGH (ref 65–99)
Glucose-Capillary: 121 mg/dL — ABNORMAL HIGH (ref 65–99)
Glucose-Capillary: 226 mg/dL — ABNORMAL HIGH (ref 65–99)
Glucose-Capillary: 212 mg/dL — ABNORMAL HIGH (ref 65–99)

## 2018-04-10 LAB — LACTIC ACID, PLASMA
LACTIC ACID, VENOUS: 0.8 mmol/L (ref 0.5–1.9)
Lactic Acid, Venous: 1.2 mmol/L (ref 0.5–1.9)

## 2018-04-10 LAB — HIV ANTIBODY (ROUTINE TESTING W REFLEX): HIV SCREEN 4TH GENERATION: NONREACTIVE

## 2018-04-10 MED ORDER — GADOBENATE DIMEGLUMINE 529 MG/ML IV SOLN
20.0000 mL | Freq: Once | INTRAVENOUS | Status: AC | PRN
  Administered 2018-04-10: 20 mL via INTRAVENOUS

## 2018-04-10 MED ORDER — OXYCODONE-ACETAMINOPHEN 5-325 MG PO TABS
1.0000 | ORAL_TABLET | Freq: Four times a day (QID) | ORAL | Status: DC | PRN
  Administered 2018-04-10: 2 via ORAL
  Administered 2018-04-11 (×2): 1 via ORAL
  Administered 2018-04-12: 2 via ORAL
  Administered 2018-04-12: 1 via ORAL
  Administered 2018-04-14 – 2018-04-16 (×3): 2 via ORAL
  Filled 2018-04-10: qty 1
  Filled 2018-04-10 (×5): qty 2
  Filled 2018-04-10: qty 1
  Filled 2018-04-10: qty 2

## 2018-04-10 MED ORDER — NICOTINE 21 MG/24HR TD PT24
21.0000 mg | MEDICATED_PATCH | Freq: Every day | TRANSDERMAL | Status: DC
  Administered 2018-04-10 – 2018-04-16 (×6): 21 mg via TRANSDERMAL
  Filled 2018-04-10 (×6): qty 1

## 2018-04-10 MED ORDER — LIP MEDEX EX OINT
TOPICAL_OINTMENT | CUTANEOUS | Status: DC | PRN
  Filled 2018-04-10: qty 7

## 2018-04-10 MED ORDER — GUAIFENESIN-DM 100-10 MG/5ML PO SYRP
5.0000 mL | ORAL_SOLUTION | ORAL | Status: DC | PRN
  Administered 2018-04-10 – 2018-04-14 (×11): 5 mL via ORAL
  Filled 2018-04-10 (×5): qty 10
  Filled 2018-04-10: qty 5
  Filled 2018-04-10 (×4): qty 10
  Filled 2018-04-10: qty 5
  Filled 2018-04-10: qty 10
  Filled 2018-04-10 (×2): qty 5

## 2018-04-10 NOTE — Progress Notes (Signed)
Transported in bed for MRI rt foot.

## 2018-04-10 NOTE — Progress Notes (Signed)
Patient had several coughing episodes.  Husband stated she said she had CP.  Unable to understand what patient was saying during assessment.  Speech was clear.  MD notified via text page.

## 2018-04-10 NOTE — Consult Note (Signed)
[facility] Nurse wound consult note Reason for Consult: bilateral foot wounds Patient with onset of draining wound and cellulitis on the right foot. MBP:JPETK soft tissue ulcer along the plantar aspect of the midfoot. Sinus track from the wound along the plantar aspect extends into the plantar musculature with a 2.9 x 1.8 x 4.8 cm abscess with surrounding edema and enhancement of the flexor digitorum brevis muscle most consistent with pyomyositis, questionable early osteomyelitis  Wound type: neuropathic foot ulcerations Will need orthopedic surgeon evaluation for the right foot ulceration. Will order dry dressings for now until surgeon assessment. It is noted also in the chart patient's podietrist to see patient later today, for that reason [facility] nurse will not consult.   Dressing procedure/placement/frequency: Dry dressings until further evaluations.   Discussed POC with patient and bedside nurse.  Re consult if needed, will not follow at this time. Thanks  Keaja Reaume [employer], RN,CWOCN, CNS, [place] [phone])

## 2018-04-10 NOTE — Progress Notes (Signed)
PROGRESS NOTE    [name]  UXL:[id number] DOB: [birth year] DOA: 04/09/2018 PCP: Tamsen Roers, MD    Brief Narrative: [name] is a 52 y.o. female with a known history of DM, COPD, GERD, OSA , CVA, HLD and insomnia presents to the emergency department for evaluation of AMS.  Patient was in a usual state of health until two days ago when she became confused, weak.  Husband states that she has had worsening of a wound on the bottom of her right foot associated with Charcot deformity and is scheduled to see podiatry for procedure on 4/22.  He initially attributed her AMS to a new sleeping pill for her insomnia.  Patient herself is awake, arousable but does not answer questions appropriately. pts husband reports hallucinations with the new medications.     Assessment & Plan:   Active Problems:   Sepsis due to cellulitis ([place])   Sepsis secondary to diabetic foot infection, with early osteomyelitis of the right foot/ streptococcal bacteremia:  Admit for IV antibiotics. , podiatry consulted and Dr March Rummage will see the patient this evening.  Resume IV fluids.  Follow the sensitivities and urine cultures.  UDS is negative.  Normal lactic acid.    Anemia:  Normocytic, anemia of chronic disease.  Monitor.    Hyponatremia:  Suspect from dehydration.   Uncontrolled diabetes mellitus: CBG (last 3)  Recent Labs    04/10/18 0810 04/10/18 1136 04/10/18 1618  GLUCAP 121* 143* 213*   With hyperglycemia.  Continue the same dose of lantus. Resume SSI.    Copd: Scattered wheezing, resume duonebs.    Tobacco abuse: Resume nicotine patch.   GERD: Stable.      DVT prophylaxis: lovenox.  Code Status: full code.  Family Communication: husband at bedside.  Disposition Plan: pending work up by podiatry.   Consultants:   Spoke to Dr [employer] her podiatrist.   Dr March Rummage Podiatry.   Procedures:none.   Antimicrobials: vancomycin and zosyn. Since admission.    Subjective: Confused, tangential talking. Not in distress. But coughing.   Objective: Vitals:   04/10/18 0144 04/10/18 0405 04/10/18 1218 04/10/18 1522  BP: 107/69 105/68  140/74  Pulse: 89 94  (!) 117  Resp: (!) 21 20  18   Temp: 100.2 F (37.9 C) 98.2 F (36.8 C) 99.5 F (37.5 C) 99.7 F (37.6 C)  TempSrc: Axillary Oral Oral Oral  SpO2: 92% 93%  98%  Weight:      Height:        Intake/Output Summary (Last 24 hours) at 04/10/2018 1703 Last data filed at 04/10/2018 1152 Gross per 24 hour  Intake 600 ml  Output 650 ml  Net -50 ml   Filed Weights   04/09/18 2251  Weight: 96.2 kg (212 lb 1.3 oz)    Examination:  General exam: on 2 lit of [place] oxygen.  Respiratory system: scattered wheezing.  Respiratory effort normal. Cardiovascular system: S1 & S2 heard, RRR. No JVD, murmurs,  Gastrointestinal system: Abdomen is nondistended, soft and nontender. No organomegaly or masses felt. Normal bowel sounds heard. Central nervous system: Alert and confused. Able to move all extremities, non focal.  Extremities: right Charcot deformity, with erythematous swelling, with central ulceration, draining pus and sero sanguinous fluid. With associated cellulitis of the right lower extremity. Left foot bandaged.  Skin: No rashes, lesions or ulcers Psychiatry: confused, tangential ,     Data Reviewed: I have personally reviewed following labs and imaging studies  CBC: Recent Labs  Lab  04/09/18 1828 04/10/18 0542  WBC 30.3* 22.8*  NEUTROABS 28.2*  --   HGB 13.0 10.5*  HCT 39.1 32.0*  MCV 95.1 96.1  PLT 261 892   Basic Metabolic Panel: Recent Labs  Lab 04/09/18 1828 04/10/18 0542  NA 131* 132*  K 4.6 3.8  CL 90* 97*  CO2 25 25  GLUCOSE 385* 166*  BUN 15 14  CREATININE 1.05* 0.84  CALCIUM 9.5 8.1*   GFR: Estimated Creatinine Clearance: 89.9 mL/min (by C-G formula based on SCr of 0.84 mg/dL). Liver Function Tests: Recent Labs  Lab 04/09/18 1828 04/10/18 0542  AST 16  10*  ALT 16 11*  ALKPHOS 110 87  BILITOT 0.3 0.5  PROT 7.6 6.0*  ALBUMIN 3.3* 2.4*   No results for input(s): LIPASE, AMYLASE in the last 168 hours. No results for input(s): AMMONIA in the last 168 hours. Coagulation Profile: No results for input(s): INR, PROTIME in the last 168 hours. Cardiac Enzymes: No results for input(s): CKTOTAL, CKMB, CKMBINDEX, TROPONINI in the last 168 hours. BNP (last 3 results) No results for input(s): PROBNP in the last 8760 hours. HbA1C: No results for input(s): HGBA1C in the last 72 hours. CBG: Recent Labs  Lab 04/09/18 2331 04/10/18 0351 04/10/18 0810 04/10/18 1136 04/10/18 1618  GLUCAP 126* 146* 121* 143* 213*   Lipid Profile: No results for input(s): CHOL, HDL, LDLCALC, TRIG, CHOLHDL, LDLDIRECT in the last 72 hours. Thyroid Function Tests: No results for input(s): TSH, T4TOTAL, FREET4, T3FREE, THYROIDAB in the last 72 hours. Anemia Panel: No results for input(s): VITAMINB12, FOLATE, FERRITIN, TIBC, IRON, RETICCTPCT in the last 72 hours. Sepsis Labs: Recent Labs  Lab 04/09/18 1843 04/09/18 2057 04/09/18 2333 04/10/18 0251  LATICACIDVEN 3.40* 1.77 1.2 0.8    Recent Results (from the past 240 hour(s))  Blood Culture (routine x 2)     Status: None (Preliminary result)   Collection Time: 04/09/18  6:28 PM  Result Value Ref Range Status   Specimen Description   Final    BLOOD BLOOD LEFT FOREARM Performed at [facility] [address], [place], [place] [address]    Special Requests   Final    BOTTLES DRAWN AEROBIC AND ANAEROBIC Blood Culture adequate volume Performed at [facility] [address], [place], [place] [address]    Culture  Setup Time   Final    GRAM POSITIVE COCCI IN PAIRS AND CHAINS ANAEROBIC BOTTLE ONLY Organism ID to follow CRITICAL RESULT CALLED TO, READ BACK BY AND VERIFIED WITH: Marcy Siren PharmD 11:45 04/10/18 ([email or web address]) Performed at [facility], [place] [address], [place], [place] [address]    Culture GRAM POSITIVE COCCI  Final   Report Status PENDING  Incomplete  Blood Culture ID Panel (Reflexed)     Status: Abnormal   Collection Time: 04/09/18  6:28 PM  Result Value Ref Range Status   Enterococcus species NOT DETECTED NOT DETECTED Final   Listeria monocytogenes NOT DETECTED NOT DETECTED Final   Staphylococcus species NOT DETECTED NOT DETECTED Final   Staphylococcus aureus NOT DETECTED NOT DETECTED Final   Streptococcus species DETECTED (A) NOT DETECTED Final    Comment: CRITICAL RESULT CALLED TO, READ BACK BY AND VERIFIED WITH: Marcy Siren PharmD 11:45 04/10/18 ([email or web address])    Streptococcus agalactiae DETECTED (A) NOT DETECTED Final    Comment: CRITICAL RESULT CALLED TO, READ BACK BY AND VERIFIED WITH: Marcy Siren PharmD 11:45 04/10/18 ([email or web address])    Streptococcus pneumoniae NOT DETECTED NOT DETECTED  Final   Streptococcus pyogenes NOT DETECTED NOT DETECTED Final   Acinetobacter baumannii NOT DETECTED NOT DETECTED Final   Enterobacteriaceae species NOT DETECTED NOT DETECTED Final   Enterobacter cloacae complex NOT DETECTED NOT DETECTED Final   Escherichia coli NOT DETECTED NOT DETECTED Final   Klebsiella oxytoca NOT DETECTED NOT DETECTED Final   Klebsiella pneumoniae NOT DETECTED NOT DETECTED Final   Proteus species NOT DETECTED NOT DETECTED Final   Serratia marcescens NOT DETECTED NOT DETECTED Final   Haemophilus influenzae NOT DETECTED NOT DETECTED Final   Neisseria meningitidis NOT DETECTED NOT DETECTED Final   Pseudomonas aeruginosa NOT DETECTED NOT DETECTED Final   Candida albicans NOT DETECTED NOT DETECTED Final   Candida glabrata NOT DETECTED NOT DETECTED Final   Candida krusei NOT DETECTED NOT DETECTED Final   Candida parapsilosis NOT DETECTED NOT DETECTED Final   Candida tropicalis NOT DETECTED NOT DETECTED Final    Comment: Performed at [facility], [place] [address] [place], [place] [address]         Radiology Studies: Dg  Chest 2 View  Result Date: 04/09/2018 CLINICAL DATA:  Altered mental status EXAM: CHEST - 2 VIEW COMPARISON:  01/30/2018 FINDINGS: Postoperative changes of the cervical spine. No acute airspace disease or pleural effusion. Heart size within normal limits. No pneumothorax. IMPRESSION: No active cardiopulmonary disease. Electronically Signed   By: Donavan Foil M.D.   On: 04/09/2018 19:24   Ct Head Wo Contrast  Result Date: 04/09/2018 CLINICAL DATA:  Altered LOC EXAM: CT HEAD WITHOUT CONTRAST TECHNIQUE: Contiguous axial images were obtained from the base of the skull through the vertex without intravenous contrast. COMPARISON:  09/09/2017 head CT FINDINGS: Brain: Mild motion degradation. No acute territorial infarction, hemorrhage or intracranial mass. Normal ventricle size. Vascular: No hyperdense vessel or unexpected calcification. Skull: Normal. Negative for fracture or focal lesion. Sinuses/Orbits: No acute finding. Other: None IMPRESSION: Negative non contrasted CT appearance of the brain Electronically Signed   By: Donavan Foil M.D.   On: 04/09/2018 19:23   Mr Foot Right W Wo Contrast  Result Date: 04/10/2018 CLINICAL DATA:  Ulcer along the plantar aspect of the right foot with swelling. EXAM: MRI OF THE RIGHT FOREFOOT WITHOUT AND WITH CONTRAST TECHNIQUE: Multiplanar, multisequence MR imaging of the right forefoot was performed before and after the administration of intravenous contrast. CONTRAST:  67mL MULTIHANCE GADOBENATE DIMEGLUMINE 529 MG/ML IV SOLN COMPARISON:  03/17/2018, 07/15/2018 FINDINGS: Bones/Joint/Cartilage Fragmentation, malalignment and subluxation of the midfoot consistent with a Charcot foot. Large soft tissue ulcer along the plantar aspect of the midfoot. Sinus track from the wound along the plantar aspect extends into the plantar musculature with a 2.9 x 1.8 x 4.8 cm complex peripherally enhancing fluid collection. Surrounding edema and enhancement of the flexor digitorum brevis  muscle. Mild cortical irregularity along the plantar aspect of the subluxed cuboid and lateral cuneiform with associated mild marrow edema without enhancement with the abscess extending to the plantar aspect of these bones. Differential considerations include reactive marrow changes secondary to Charcot foot versus early osteomyelitis. No joint effusion. Ligaments Collateral ligaments are intact. Muscles and Tendons Flexor, peroneal and extensor compartment tendons are intact. Soft tissue No soft tissue mass. IMPRESSION: 1. Large soft tissue ulcer along the plantar aspect of the midfoot. Sinus track from the wound along the plantar aspect extends into the plantar musculature with a 2.9 x 1.8 x 4.8 cm abscess with surrounding edema and enhancement of the flexor digitorum brevis muscle most consistent with  pyomyositis. 2. Mild cortical irregularity along the plantar aspect of the subluxed cuboid and lateral cuneiform with associated mild marrow edema without enhancement with the abscess extending to the plantar aspect of these bones. Differential considerations include reactive marrow changes secondary to Charcot foot versus early osteomyelitis. Electronically Signed   By: Kathreen Devoid   On: 04/10/2018 08:36   Dg Foot Complete Right  Result Date: 04/09/2018 CLINICAL DATA:  Ulcer to plantar surface of right foot with swelling and redness EXAM: RIGHT FOOT COMPLETE - 3+ VIEW COMPARISON:  MRI 03/17/2018 FINDINGS: Grossly abnormal appearance of the right foot with Charcot deformity of the midfoot. Lateral and dorsal dislocation at the Lisfranc joint with inferior displacement of the navicular, cuboid and cuneiform bones. Diffuse osteopenia. Moderate plantar calcaneal spur. Soft tissue swelling with large plantar soft tissue protuberance measuring 5.5 cm presumably corresponding to the history of ulcer, with small foci of soft tissue gas. IMPRESSION: 1. 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 Inferior subluxation of the navicular, cuboid and cuneiform bones; the deformity and bony destructive changes limit evaluation for acute osseous abnormality. 2. Soft tissue swelling with large plantar aspect ulcer Electronically Signed   By: Donavan Foil M.D.   On: 04/09/2018 19:30        Scheduled Meds: . enoxaparin (LOVENOX) injection  40 mg Subcutaneous QHS  . insulin aspart  0-15 Units Subcutaneous Q4H  . insulin glargine  36 Units Subcutaneous QAC breakfast  . sodium chloride flush  3 mL Intravenous Q12H   Continuous Infusions: . sodium chloride 100 mL/hr at 04/10/18 0811  . piperacillin-tazobactam (ZOSYN)  IV 3.375 g (04/10/18 1604)  . vancomycin       LOS: 1 day    Time spent: 35 minutes    Hosie Poisson, MD [facility] Pager [phone]  If 7PM-7AM, please contact night-coverage [email or web address] Password [facility] 04/10/2018, 5:03 PM

## 2018-04-10 NOTE — Evaluation (Signed)
Physical Therapy Evaluation Patient Details Name: [name] MRN: [id number] DOB: Sep 11, 1966 Today's Date: 04/10/2018   History of Present Illness  52 yo female admitted with sepsis, AMS, R foot wound: MRI showed abscess, pyomyositis, possible osteomyelitis. Hx of bipolar d/o, chronic back pain, Dm, COPD, CVA, Charcot foot, neuropathy    Clinical Impression  On eval, pt required Min assist for bed mobility. Did not attempt OOB activity at this time. Awaiting recommendations from MD (podiatry vs orthopedics???) about activity level and WB status. Will plan to progress activity once appropriate. Will follow during hospital stay.     Follow Up Recommendations Home health PT;Supervision/Assistance - 24 hour (depending on progress. Continuing to assess)    Equipment Recommendations  (continuing to assess)    Recommendations for Other Services       Precautions / Restrictions Precautions Precautions: Fall Restrictions Other Position/Activity Restrictions: unsure of WB status at this time. Await further recs from MD      Mobility  Bed Mobility Overal bed mobility: Needs Assistance Bed Mobility: Supine to Sit;Sit to Supine     Supine to sit: Min assist;HOB elevated Sit to supine: Min assist;HOB elevated   General bed mobility comments: Assist for LEs on/off bed. Increased time.   Transfers                 General transfer comment: NT-awaiting recs from MD (activity level, WB status)  Ambulation/Gait                Stairs            Wheelchair Mobility    Modified Rankin (Stroke Patients Only)       Balance                                             Pertinent Vitals/Pain Pain Assessment: Faces Faces Pain Scale: Hurts little more Pain Location: feet with dependency Pain Descriptors / Indicators: Aching;Discomfort Pain Intervention(s): Limited activity within patient's tolerance;Repositioned    Home Living Family/patient  expects to be discharged to:: Private residence Living Arrangements: Spouse/significant other Available Help at Discharge: Family Type of Home: House Home Access: Stairs to enter Entrance Stairs-Rails: None Technical brewer of Steps: 2 Home Layout: One level Home Equipment: Environmental consultant - 4 wheels      Prior Function Level of Independence: Independent         Comments: per husband, pt was independent with mobility prior to admission     Hand Dominance        Extremity/Trunk Assessment   Upper Extremity Assessment Upper Extremity Assessment: Generalized weakness    Lower Extremity Assessment Lower Extremity Assessment: Generalized weakness;RLE deficits/detail;LLE deficits/detail RLE Deficits / Details: dressing on foot and lower leg RLE Sensation: history of peripheral neuropathy LLE Deficits / Details: dressing on foot LLE Sensation: history of peripheral neuropathy    Cervical / Trunk Assessment Cervical / Trunk Assessment: Normal  Communication   Communication: No difficulties  Cognition Arousal/Alertness: Awake/alert Behavior During Therapy: WFL for tasks assessed/performed Overall Cognitive Status: Impaired/Different from baseline                                 General Comments: pt did not respond appropriately to some questions      General Comments      Exercises  Assessment/Plan    PT Assessment Patient needs continued PT services  PT Problem List Decreased balance;Decreased mobility;Decreased activity tolerance;Pain       PT Treatment Interventions DME instruction;Gait training;Functional mobility training;Therapeutic activities;Balance training;Patient/family education;Therapeutic exercise    PT Goals (Current goals can be found in the Care Plan section)  Acute Rehab PT Goals Patient Stated Goal: none stated PT Goal Formulation: With family Time For Goal Achievement: 04/24/18 Potential to Achieve Goals: Good     Frequency Min 3X/week   Barriers to discharge        Co-evaluation               AM-PAC PT "6 Clicks" Daily Activity  Outcome Measure Difficulty turning over in bed (including adjusting bedclothes, sheets and blankets)?: A Lot Difficulty moving from lying on back to sitting on the side of the bed? : Unable Difficulty sitting down on and standing up from a chair with arms (e.g., wheelchair, bedside commode, etc,.)?: Unable Help needed moving to and from a bed to chair (including a wheelchair)?: A Lot Help needed walking in hospital room?: A Lot Help needed climbing 3-5 steps with a railing? : A Lot 6 Click Score: 10    End of Session   Activity Tolerance: Patient limited by pain Patient left: in bed;with call bell/phone within reach;with bed alarm set;with family/visitor present   PT Visit Diagnosis: Pain;Difficulty in walking, not elsewhere classified (R26.2);Other abnormalities of gait and mobility (R26.89) Pain - Right/Left: (L and R) Pain - part of body: Ankle and joints of foot    Time: 1345-1400 PT Time Calculation (min) (ACUTE ONLY): 15 min   Charges:   PT Evaluation $PT Eval Moderate Complexity: 1 Mod     PT G Codes:          Weston Anna, MPT Pager: [phone]

## 2018-04-10 NOTE — Progress Notes (Signed)
Pt has arrived from the ED , slow to respond to verbal directions to transfer to bed from stretcher. Then began trying to crawl out of bed. Spouse at the bedside for reinforcement of directions. Orders reviewed & initiated.

## 2018-04-10 NOTE — Telephone Encounter (Signed)
Pt's Husband, Etta Quill states pt was admitted to [facility] yesterday with both feet "busted open" and draining. Shanon Brow states he isn't sure pt will be able to make her surgery with Dr. Cannon Kettle on Monday. I told Shanon Brow I would inform Dr. Cannon Kettle and see which of our doctors accepted referrals to [facility] and call again.

## 2018-04-10 NOTE — Progress Notes (Signed)
PHARMACY - PHYSICIAN COMMUNICATION CRITICAL VALUE ALERT - BLOOD CULTURE IDENTIFICATION (BCID)  [name] is an 52 y.o. female who presented to [facility] on 04/09/2018 with a chief complaint of mental status changes  Assessment:  Patient with diabetic foot infection on empiric vancomycin and zosyn  Name of physician (or Provider) ContactedKarleen Hampshire   Current antibiotics: vancomycin and zosyn  Changes to prescribed antibiotics recommended:  Physician to evaluate antibiotics and possibly de-escalate.   Results for orders placed or performed during the hospital encounter of 04/09/18  Blood Culture ID Panel (Reflexed) (Collected: 04/09/2018  6:28 PM)  Result Value Ref Range   Enterococcus species NOT DETECTED NOT DETECTED   Listeria monocytogenes NOT DETECTED NOT DETECTED   Staphylococcus species NOT DETECTED NOT DETECTED   Staphylococcus aureus NOT DETECTED NOT DETECTED   Streptococcus species DETECTED (A) NOT DETECTED   Streptococcus agalactiae DETECTED (A) NOT DETECTED   Streptococcus pneumoniae NOT DETECTED NOT DETECTED   Streptococcus pyogenes NOT DETECTED NOT DETECTED   Acinetobacter baumannii NOT DETECTED NOT DETECTED   Enterobacteriaceae species NOT DETECTED NOT DETECTED   Enterobacter cloacae complex NOT DETECTED NOT DETECTED   Escherichia coli NOT DETECTED NOT DETECTED   Klebsiella oxytoca NOT DETECTED NOT DETECTED   Klebsiella pneumoniae NOT DETECTED NOT DETECTED   Proteus species NOT DETECTED NOT DETECTED   Serratia marcescens NOT DETECTED NOT DETECTED   Haemophilus influenzae NOT DETECTED NOT DETECTED   Neisseria meningitidis NOT DETECTED NOT DETECTED   Pseudomonas aeruginosa NOT DETECTED NOT DETECTED   Candida albicans NOT DETECTED NOT DETECTED   Candida glabrata NOT DETECTED NOT DETECTED   Candida krusei NOT DETECTED NOT DETECTED   Candida parapsilosis NOT DETECTED NOT DETECTED   Candida tropicalis NOT DETECTED NOT DETECTED   Doreene Eland, PharmD, BCPS.    Pager: [phone] 04/10/2018 11:53 AM

## 2018-04-10 NOTE — Progress Notes (Signed)
MRI has called for pt-

## 2018-04-10 NOTE — Telephone Encounter (Signed)
Dr. Cannon Kettle states Dr. March Rummage may accept referral to [facility]. I Skyed Dr. March Rummage with pt information and he states he will be able to see pt this afternoon. I informed Shanon Brow, he should inform pt's nurse to make referral to our Dr. March Rummage. Shanon Brow states understanding.

## 2018-04-10 NOTE — Progress Notes (Signed)
PT Cancellation Note  Patient Details Name: [name] MRN: [id number] DOB: 1966-07-24   Cancelled Treatment:    Reason Eval/Treat Not Completed: Medical issues which prohibited therapy. Will hold PT for now. Pt awating podiatry consult. [place] nurse recommended ortho consult. Will await further recommendations-activity level and WB status from MD before proceeding with PT evaluation. Thanks.    Weston Anna, MPT Pager: [phone]

## 2018-04-11 ENCOUNTER — Inpatient Hospital Stay (HOSPITAL_COMMUNITY): Payer: Medicare Other | Admitting: Anesthesiology

## 2018-04-11 ENCOUNTER — Encounter (HOSPITAL_COMMUNITY): Payer: Self-pay | Admitting: *Deleted

## 2018-04-11 ENCOUNTER — Encounter (HOSPITAL_COMMUNITY)
Admission: EM | Disposition: A | Discharge status: Rehabilitation Facility | Payer: Self-pay | Source: Home / Self Care | Attending: Internal Medicine

## 2018-04-11 DIAGNOSIS — Z794 Long term (current) use of insulin: Secondary | ICD-10-CM

## 2018-04-11 DIAGNOSIS — F17210 Nicotine dependence, cigarettes, uncomplicated: Secondary | ICD-10-CM

## 2018-04-11 DIAGNOSIS — F339 Major depressive disorder, recurrent, unspecified: Secondary | ICD-10-CM | POA: Diagnosis present

## 2018-04-11 DIAGNOSIS — L089 Local infection of the skin and subcutaneous tissue, unspecified: Secondary | ICD-10-CM

## 2018-04-11 DIAGNOSIS — E119 Type 2 diabetes mellitus without complications: Secondary | ICD-10-CM

## 2018-04-11 DIAGNOSIS — L02419 Cutaneous abscess of limb, unspecified: Secondary | ICD-10-CM

## 2018-04-11 DIAGNOSIS — M6510 Other infective (teno)synovitis, unspecified site: Secondary | ICD-10-CM

## 2018-04-11 DIAGNOSIS — Z636 Dependent relative needing care at home: Secondary | ICD-10-CM

## 2018-04-11 DIAGNOSIS — G9341 Metabolic encephalopathy: Secondary | ICD-10-CM

## 2018-04-11 DIAGNOSIS — R450 Nervousness: Secondary | ICD-10-CM

## 2018-04-11 DIAGNOSIS — R440 Auditory hallucinations: Secondary | ICD-10-CM

## 2018-04-11 DIAGNOSIS — E11621 Type 2 diabetes mellitus with foot ulcer: Secondary | ICD-10-CM

## 2018-04-11 DIAGNOSIS — F17200 Nicotine dependence, unspecified, uncomplicated: Secondary | ICD-10-CM

## 2018-04-11 DIAGNOSIS — L97414 Non-pressure chronic ulcer of right heel and midfoot with necrosis of bone: Secondary | ICD-10-CM

## 2018-04-11 DIAGNOSIS — F419 Anxiety disorder, unspecified: Secondary | ICD-10-CM

## 2018-04-11 DIAGNOSIS — E11628 Type 2 diabetes mellitus with other skin complications: Secondary | ICD-10-CM

## 2018-04-11 HISTORY — PX: INCISION AND DRAINAGE OF WOUND: SHX1803

## 2018-04-11 LAB — CREATININE, SERUM
Creatinine, Ser: 0.64 mg/dL (ref 0.44–1.00)
GFR calc Af Amer: >60 mL/min (ref >60)
GFR calc non Af Amer: >60 mL/min (ref >60)

## 2018-04-11 LAB — GLUCOSE, CAPILLARY
GLUCOSE-CAPILLARY: 205 mg/dL — AB (ref 65–99)
GLUCOSE-CAPILLARY: 97 mg/dL (ref 65–99)
Glucose-Capillary: 190 mg/dL — ABNORMAL HIGH (ref 65–99)
Glucose-Capillary: 140 mg/dL — ABNORMAL HIGH (ref 65–99)
Glucose-Capillary: 91 mg/dL (ref 65–99)
Glucose-Capillary: 192 mg/dL — ABNORMAL HIGH (ref 65–99)

## 2018-04-11 LAB — URINE CULTURE

## 2018-04-11 LAB — SURGICAL PCR SCREEN
MRSA, PCR: POSITIVE — AB
STAPHYLOCOCCUS AUREUS: POSITIVE — AB

## 2018-04-11 SURGERY — IRRIGATION AND DEBRIDEMENT WOUND
Anesthesia: Monitor Anesthesia Care | Site: Foot | Laterality: Right

## 2018-04-11 MED ORDER — 0.9 % SODIUM CHLORIDE (POUR BTL) OPTIME
TOPICAL | Status: DC | PRN
  Administered 2018-04-11: 1000 mL

## 2018-04-11 MED ORDER — ROPINIROLE HCL 1 MG PO TABS
2.0000 mg | ORAL_TABLET | Freq: Every day | ORAL | Status: DC
  Administered 2018-04-11: 2 mg via ORAL
  Filled 2018-04-11: qty 2

## 2018-04-11 MED ORDER — FENTANYL CITRATE (PF) 100 MCG/2ML IJ SOLN
INTRAMUSCULAR | Status: DC | PRN
  Administered 2018-04-11 (×2): 50 ug via INTRAVENOUS

## 2018-04-11 MED ORDER — VANCOMYCIN HCL 1000 MG IV SOLR
500.0000 mg | INTRAVENOUS | Status: AC
  Administered 2018-04-11: 1000 mg
  Filled 2018-04-11: qty 500

## 2018-04-11 MED ORDER — ONDANSETRON HCL 4 MG/2ML IJ SOLN: 4.0000 mg | Freq: Once | INTRAMUSCULAR | Status: DC | PRN

## 2018-04-11 MED ORDER — ROPINIROLE HCL 1 MG PO TABS
2.0000 mg | ORAL_TABLET | Freq: Every day | ORAL | Status: DC
  Administered 2018-04-12 – 2018-04-15 (×4): 2 mg via ORAL
  Filled 2018-04-11 (×4): qty 2

## 2018-04-11 MED ORDER — PROPOFOL 500 MG/50ML IV EMUL: INTRAVENOUS | Status: DC | PRN

## 2018-04-11 MED ORDER — FENTANYL CITRATE (PF) 100 MCG/2ML IJ SOLN
INTRAMUSCULAR | Status: AC
Start: 2018-04-11 — End: 2018-04-11
  Filled 2018-04-11: qty 2

## 2018-04-11 MED ORDER — METRONIDAZOLE IN NACL 5-0.79 MG/ML-% IV SOLN
500.0000 mg | Freq: Three times a day (TID) | INTRAVENOUS | Status: DC
  Administered 2018-04-11 – 2018-04-12 (×3): 500 mg via INTRAVENOUS
  Filled 2018-04-11 (×3): qty 100

## 2018-04-11 MED ORDER — ONDANSETRON HCL 4 MG/2ML IJ SOLN
INTRAMUSCULAR | Status: DC | PRN
  Administered 2018-04-11: 4 mg via INTRAVENOUS

## 2018-04-11 MED ORDER — IPRATROPIUM BROMIDE 0.02 % IN SOLN: 0.5000 mg | Freq: Four times a day (QID) | RESPIRATORY_TRACT | Status: DC | PRN

## 2018-04-11 MED ORDER — ENOXAPARIN SODIUM 40 MG/0.4ML ~~LOC~~ SOLN: 40.0000 mg | SUBCUTANEOUS | Status: DC

## 2018-04-11 MED ORDER — HYDROMORPHONE HCL 1 MG/ML IJ SOLN: 0.2500 mg | INTRAMUSCULAR | Status: DC | PRN

## 2018-04-11 MED ORDER — LACTATED RINGERS IV SOLN
INTRAVENOUS | Status: DC
  Administered 2018-04-11: 17:00:00 via INTRAVENOUS

## 2018-04-11 MED ORDER — MIDAZOLAM HCL 2 MG/2ML IJ SOLN
INTRAMUSCULAR | Status: AC
  Filled 2018-04-11: qty 2

## 2018-04-11 MED ORDER — MEPERIDINE HCL 50 MG/ML IJ SOLN: 6.2500 mg | INTRAMUSCULAR | Status: DC | PRN

## 2018-04-11 MED ORDER — PROPOFOL 10 MG/ML IV BOLUS
INTRAVENOUS | Status: DC | PRN
  Administered 2018-04-11: 20 mg via INTRAVENOUS

## 2018-04-11 MED ORDER — ONDANSETRON HCL 4 MG/2ML IJ SOLN
INTRAMUSCULAR | Status: AC
  Filled 2018-04-11: qty 2

## 2018-04-11 MED ORDER — GABAPENTIN 300 MG PO CAPS
600.0000 mg | ORAL_CAPSULE | Freq: Two times a day (BID) | ORAL | Status: DC
  Administered 2018-04-11 – 2018-04-16 (×10): 600 mg via ORAL
  Filled 2018-04-11 (×10): qty 2

## 2018-04-11 MED ORDER — BUPIVACAINE HCL (PF) 0.5 % IJ SOLN
INTRAMUSCULAR | Status: DC | PRN
  Administered 2018-04-11: 20 mL

## 2018-04-11 MED ORDER — SODIUM CHLORIDE 0.9 % IV SOLN
2.0000 g | INTRAVENOUS | Status: DC
  Administered 2018-04-11: 2 g via INTRAVENOUS
  Filled 2018-04-11: qty 2
  Filled 2018-04-11: qty 20

## 2018-04-11 MED ORDER — QUETIAPINE FUMARATE 400 MG PO TABS
600.0000 mg | ORAL_TABLET | Freq: Every day | ORAL | Status: DC
  Administered 2018-04-11 – 2018-04-15 (×5): 600 mg via ORAL
  Filled 2018-04-11: qty 2
  Filled 2018-04-11: qty 6
  Filled 2018-04-11 (×2): qty 2
  Filled 2018-04-11: qty 6

## 2018-04-11 MED ORDER — MIDAZOLAM HCL 5 MG/5ML IJ SOLN
INTRAMUSCULAR | Status: DC | PRN
  Administered 2018-04-11: 1 mg via INTRAVENOUS

## 2018-04-11 MED ORDER — BUPIVACAINE HCL (PF) 0.5 % IJ SOLN
INTRAMUSCULAR | Status: AC
  Filled 2018-04-11: qty 30

## 2018-04-11 SURGICAL SUPPLY — 30 items
BANDAGE ACE 4X5 VEL STRL LF (GAUZE/BANDAGES/DRESSINGS) ×3 IMPLANT
BLADE HEX COATED 2.75 (ELECTRODE) ×3 IMPLANT
BNDG COHESIVE 4X5 TAN STRL (GAUZE/BANDAGES/DRESSINGS) ×3 IMPLANT
BNDG COHESIVE 6X5 TAN STRL LF (GAUZE/BANDAGES/DRESSINGS) ×6 IMPLANT
COTTON STERILE ROLL (GAUZE/BANDAGES/DRESSINGS) ×3 IMPLANT
COVER SURGICAL LIGHT HANDLE (MISCELLANEOUS) ×3 IMPLANT
DRAPE U-SHAPE 47X51 STRL (DRAPES) ×3 IMPLANT
DRSG ADAPTIC 3X8 NADH LF (GAUZE/BANDAGES/DRESSINGS) ×3 IMPLANT
DURAPREP 26ML APPLICATOR (WOUND CARE) ×3 IMPLANT
ELECT REM PT RETURN 15FT ADLT (MISCELLANEOUS) ×3 IMPLANT
GAUZE PACKING IODOFORM 1X5 (MISCELLANEOUS) ×2 IMPLANT
GAUZE SPONGE 4X4 12PLY STRL (GAUZE/BANDAGES/DRESSINGS) ×3 IMPLANT
GLOVE BIOGEL PI IND STRL 8.5 (GLOVE) ×1 IMPLANT
GLOVE BIOGEL PI INDICATOR 8.5 (GLOVE) ×2
GLOVE SURG ORTHO 9.0 STRL STRW (GLOVE) ×3 IMPLANT
HANDPIECE INTERPULSE COAX TIP (DISPOSABLE) ×3
KIT BASIN OR (CUSTOM PROCEDURE TRAY) ×3 IMPLANT
MANIFOLD NEPTUNE II (INSTRUMENTS) ×3 IMPLANT
NS IRRIG 1000ML POUR BTL (IV SOLUTION) ×3 IMPLANT
PACK ORTHO EXTREMITY (CUSTOM PROCEDURE TRAY) ×3 IMPLANT
PAD ABD 7.5X8 STRL (GAUZE/BANDAGES/DRESSINGS) ×3 IMPLANT
PADDING CAST COTTON 6X4 STRL (CAST SUPPLIES) ×3 IMPLANT
POSITIONER SURGICAL ARM (MISCELLANEOUS) ×3 IMPLANT
SET HNDPC FAN SPRY TIP SCT (DISPOSABLE) ×1 IMPLANT
SPONGE LAP 18X18 X RAY DECT (DISPOSABLE) ×3 IMPLANT
STOCKINETTE 8 INCH (MISCELLANEOUS) ×3 IMPLANT
SWAB COLLECTION DEVICE MRSA (MISCELLANEOUS) ×3 IMPLANT
SWAB CULTURE ESWAB REG 1ML (MISCELLANEOUS) ×3 IMPLANT
UNDERPAD 30X30 (UNDERPADS AND DIAPERS) ×3 IMPLANT
WATER STERILE IRR 1000ML POUR (IV SOLUTION) ×3 IMPLANT

## 2018-04-11 NOTE — Anesthesia Postprocedure Evaluation (Signed)
Anesthesia Post Note  Patient: [name]  Procedure(s) Performed: IRRIGATION AND DEBRIDEMENT WOUND right foot and right ankle (Right Foot)     Patient location during evaluation: PACU Anesthesia Type: MAC Level of consciousness: awake and alert Pain management: pain level controlled Vital Signs Assessment: post-procedure vital signs reviewed and stable Respiratory status: spontaneous breathing, nonlabored ventilation, respiratory function stable and patient connected to nasal cannula oxygen Cardiovascular status: stable and blood pressure returned to baseline Postop Assessment: no apparent nausea or vomiting Anesthetic complications: no    Last Vitals:  Vitals:   04/11/18 2000 04/11/18 2015  BP: 108/62 122/64  Pulse: 95 93  Resp: (!) 23 (!) 21  Temp:    SpO2: 91% 98%    Last Pain:  Vitals:   04/11/18 2015  TempSrc:   PainSc: [place] DAVID

## 2018-04-11 NOTE — Anesthesia Preprocedure Evaluation (Addendum)
Anesthesia Evaluation  Patient identified by MRN, date of birth, ID band Patient awake    Reviewed: Allergy & Precautions, NPO status , Patient's Chart, lab work & pertinent test results  Airway Mallampati: I  TM Distance: >3 FB Neck ROM: Full    Dental   Pulmonary sleep apnea , Current Smoker,    Pulmonary exam normal        Cardiovascular hypertension, Pt. on medications Normal cardiovascular exam     Neuro/Psych Anxiety Depression Bipolar Disorder    GI/Hepatic GERD  Medicated and Controlled,  Endo/Other  diabetes, Type 2, Insulin Dependent  Renal/GU      Musculoskeletal   Abdominal   Peds  Hematology   Anesthesia Other Findings   Reproductive/Obstetrics                            Anesthesia Physical Anesthesia Plan  ASA: III and emergent  Anesthesia Plan: MAC   Post-op Pain Management:    Induction: Intravenous  PONV Risk Score and Plan: 1 and Ondansetron  Airway Management Planned: Simple Face Mask  Additional Equipment:   Intra-op Plan:   Post-operative Plan:   Informed Consent: I have reviewed the patients History and Physical, chart, labs and discussed the procedure including the risks, benefits and alternatives for the proposed anesthesia with the patient or authorized representative who has indicated his/her understanding and acceptance.     Plan Discussed with: CRNA and Surgeon  Anesthesia Plan Comments:         Anesthesia Quick Evaluation

## 2018-04-11 NOTE — Consult Note (Signed)
[facility] Consult   Reason for Consult:  Hallucinations  Referring Physician:  EDP Patient Identification: [name] MRN:  [id number] Principal Diagnosis: major depressive disorder, recurrent, mild Diagnosis:   Patient Active Problem List   Diagnosis Date Noted  . Major depressive disorder, recurrent episode ([place]) [F33.9] 04/11/2018    Priority: High  . Sepsis due to cellulitis ([place]) [L03.90, A41.9] 04/09/2018  . Cancer of sigmoid colon ([place]) [C18.7] 03/09/2015  . OSA (obstructive sleep apnea) [G47.33] 07/04/2014  . Migraine without aura, with intractable migraine, so stated, without mention of status migrainosus [G43.019] 03/26/2014  . Peripheral neuropathy [G62.9] 03/03/2014  . Abnormal stress test [R94.39] 02/12/2014  . CVA (cerebral infarction) [I63.9] 02/12/2014  . Chest pain [R07.9]   . Abnormal heart rhythm [I49.9]   . Edema [R60.9]   . Hypertension [I10]   . Hyperlipemia [E78.5]   . Hypercholesterolemia [E78.00]   . Other and unspecified hyperlipidemia [E78.5] 11/14/2013  . Type II diabetes mellitus, uncontrolled ([place]) [E11.65] 07/22/2013  . Essential hypertension [I10] 07/22/2013  . Type II or unspecified type diabetes mellitus with neurological manifestations, uncontrolled [E11.49] 07/22/2013  . Diabetic polyneuropathy ([place]) [E11.42] 07/22/2013    Total Time spent with patient: 45 minutes  Subjective:   [name] is a 52 y.o. female patient denies suicidal/homicidal ideations, hallucinations, and substance abuse.  Does request something for anxiety and depression, recommend Celexa 10 mg daily for depression and anxiety, hydroxyzine 25 mg TID PRN anxiety.  Dr. Dwyane Dee has reviewed this patient and concurs with the plan.  HPI:  52 yo female with low level of depression and anxiety due to her medical issues and need to be home to care for her disabled child.  Discussed medications and she had requested Valium but explained why we do not do this  medication and agreeable to start citalopram (Celexa) instead and hydroxyzine 25 mg TID PRN anxiety.  She is clear and coherent, not responding to internal stimuli.  Past Psychiatric History: depression, anxiety  Risk to Self: Is patient at risk for suicide?: No Risk to Others:  No Prior Inpatient Therapy:  None Prior Outpatient Therapy:  None  Past Medical History:  Past Medical History:  Diagnosis Date  . Anxiety   . Arthritis   . Bipolar disorder ([place])   . Broken jaw ([place])   . Broken wrist   . Chronic back pain   . Claudication ([place])    a. 12/2013 ABI's: R 0.97, L 0.94.  [name] COPD (chronic obstructive pulmonary disease) ([place])   . Depression   . Diabetic Charcot's foot ([place])   . Diabetic foot ulcer (HCC)    chronic left foot ulcer , great toe/  hx recurrent foot ulcer bilaterally  . DJD (degenerative joint disease)   . Edema of both lower extremities   . Episode of memory loss   . GERD (gastroesophageal reflux disease)   . Headache(784.0)   . Hiatal hernia   . History of colon cancer, stage I dx 02-26-2015--- oncologist-- dr [name]--- per last note no recurrence   05-16-2015 s/p  Laparoscopic sigmoid colectomy w/ node bx's (negative nodes per path)  Stage I (pT1,N0,M0) Grade 2  . 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   per MRI multiple small strokes post cardiac cath. --  residual mild memory loss  . History of methicillin resistant staphylococcus aureus (MRSA)   . Hypercholesterolemia   . Insomnia   . Insulin dependent type 2 diabetes  mellitus, uncontrolled [facility]) dx 2004   endocrinologist-  dr Dwyane Dee-- last A1c 8.8 in Aug2018:  pt is noncompliant w/ diet, states does note eat breakfast , her first main meal in afternoon  . Neurogenic bladder   . Neuropathy, diabetic ([place])    hands and feet  . OSA (obstructive sleep apnea)    cpap intolerant  . Personality disorder ([place])   . SOB (shortness of breath) on exertion   . Stroke ([place])   . SUI  (stress urinary incontinence, female) S/P SLING 12-29-2011    Past Surgical History:  Procedure Laterality Date  . ABDOMINAL HYSTERECTOMY    . ANTERIOR CERVICAL DECOMP/DISCECTOMY FUSION  2000   C5 - 7  . CARDIAC CATHETERIZATION  05-22-2008   DR SKAINS   NO SIGNIFECANT CAD/ NORMAL LV/  EF 65%/  NO WALL MOTION ABNORMALITIES  . CARPAL TUNNEL RELEASE Right 04-25-2013  . [place]; 1992  . COLONOSCOPY    . CYSTO N/A 04/30/2013   Procedure: CYSTOSCOPY;  Surgeon: Reece Packer, MD;  [facility];  Service: Urology;  Laterality: N/A;  . CYSTOSCOPY MACROPLASTIQUE IMPLANT N/A 02/06/2018   Procedure: CYSTOSCOPY MACROPLASTIQUE IMPLANT;  Surgeon: Bjorn Loser, MD;  Location: [facility];  Service: Urology;  Laterality: N/A;  . CYSTOSCOPY WITH INJECTION  05/04/2012   Procedure: CYSTOSCOPY WITH INJECTION;  Surgeon: Reece Packer, MD;  Location: [facility];  Service: Urology;  Laterality: N/A;  MACROPLASTIQUE INJECTION  . CYSTOSCOPY WITH INJECTION  08/28/2012   Procedure: CYSTOSCOPY WITH INJECTION;  Surgeon: Reece Packer, MD;  Location: [facility];  Service: Urology;  Laterality: N/A;  cysto and macroplastique   . FOOT SURGERY Bilateral   . HERNIA REPAIR  ?1996   "stomach"  . KNEE ARTHROSCOPY W/ ALLOGRAFT IMPANT Left    graft x 2  . KNEE SURGERY     TOTAL 8 SURG'S  . LAPAROSCOPIC SIGMOID COLECTOMY N/A 04/22/2015   Procedure: LAPAROSCOPIC HAND ASSISTED SIGMOID COLECTOMY;  Surgeon: Erroll Luna, MD;  Location: [facility];  Service: General;  Laterality: N/A;  . LEFT HEART CATHETERIZATION WITH CORONARY ANGIOGRAM N/A 02/12/2014   Procedure: LEFT HEART CATHETERIZATION WITH CORONARY ANGIOGRAM;  Surgeon: Candee Furbish, MD;  Location: [facility] CATH LAB;  Service: Cardiovascular;  Laterality: N/A;   No angiographically significant CAD; normal LVSF, LVEDP 36mHg,  EF 55% (new finding ef 30% myoview 01-08-2014)  . LUMBAR FUSION    . MANDIBLE  FRACTURE SURGERY    . MULTIPLE LAPAROSCOPIES FOR ENDOMETRIOSIS    . PUBOVAGINAL SLING  12/29/2011   Procedure: PGaynelle Arabian  Surgeon: SReece Packer MD;  [facility]  Service: Urology;  Laterality: N/A;  cysto and sparc sling   . PUBOVAGINAL SLING N/A 04/30/2013   Procedure: REMOVAL OF VAGINAL MESH;  Surgeon: SReece Packer MD;  [facility];  Service: Urology;  Laterality: N/A;  . RECONSTURCTION OF CONGENITAL UTERUS ANOMALY  1983  . REPEAT RECONSTRUCTION ACL LEFT KNEE/ SCREWS REMOVED  03-28-2000   CADAVER GRAFT  . TOTAL ABDOMINAL HYSTERECTOMY W/ BILATERAL SALPINGOOPHORECTOMY  1997  . TRANSTHORACIC ECHOCARDIOGRAM  02/13/2014   ef 45%, hypokinesis base inferior and base inferolateral walls  . UPPER GI ENDOSCOPY    . WOUND DEBRIDEMENT Right 09/05/2016   Procedure: DEBRIDEMENT WOUND WITH GRAFT RIGHT FOOT;  Surgeon: TLandis Martins DPM;  [place] [facility]  Service: Podiatry;  Laterality: Right;  . WRIST FRACTURE SURGERY     Family History:  Family History  Problem Relation Age of Onset  . Hypertension Mother   . Diabetes Mother   . Cancer - Other Mother        lymphoma   . Cancer - Other Father        lung, bladder cancer   . Heart attack Father   . Cancer - Other Brother        bladder cancer    Family Psychiatric  History: none Social History:  Social History   Substance and Sexual Activity  Alcohol Use No  . Alcohol/week: 0.0 oz     Social History   Substance and Sexual Activity  Drug Use No    Social History   Socioeconomic History  . Marital status: Divorced    Spouse name: Not on file  . Number of children: 2  . Years of education: 6th  . Highest education level: Not on file  Occupational History  . Not on file  Social Needs  . Financial resource strain: Not on file  . Food insecurity:    Worry: Not on file    Inability: Not on file  . Transportation needs:    Medical: Not on file    Non-medical: Not on file   Tobacco Use  . Smoking status: Current Every Day Smoker    Packs/day: 4.00    Years: 45.00    Pack years: 180.00    Types: Cigarettes  . Smokeless tobacco: Never Used  . Tobacco comment: per pt started smoking  age 41  Substance and Sexual Activity  . Alcohol use: No    Alcohol/week: 0.0 oz  . Drug use: No  . Sexual activity: Yes  Lifestyle  . Physical activity:    Days per week: Not on file    Minutes per session: Not on file  . Stress: Not on file  Relationships  . Social connections:    Talks on phone: Not on file    Gets together: Not on file    Attends religious service: Not on file    Active member of club or organization: Not on file    Attends meetings of clubs or organizations: Not on file    Relationship status: Not on file  Other Topics Concern  . Not on file  Social History Narrative   Patient is divorced and lives alone- independent in ADLs, Drives   Patient has two adult children- grown son and daughter who is special needs in a home   Patient is disabled since 54   Patient has a 6th grade education.   Patient is right-handed.   Depression-medication and therapist   Patient drinks 2- 3 liters of soda and drinks tea but not everyday.    Does not routinely exercise.      Patient reports that she drinks "a lot" of caffeine daily    Additional Social History:    Allergies:   Allergies  Allergen Reactions  . Latex Itching, Rash and Other (See Comments)    Pt states she cannot use condoms - cause an infection.  Use of latex on skin is okay.  Tape causes rash  . Sweetening Enhancer [Flavoring Agent] Nausea And Vomiting and Other (See Comments)    HEADACHES  . Aspartame And Phenylalanine Nausea And Vomiting    HEADACHES  . Ibuprofen Other (See Comments)    HEADACHES  . Trazodone And Nefazodone Other (See Comments)    Hallucinations   . Triazolam Other (See Comments)    HALLUCINATIONS  Labs:  Results for orders placed or performed during the  hospital encounter of 04/09/18 (from the past 48 hour(s))  CBG monitoring, ED     Status: Abnormal   Collection Time: 04/09/18  5:16 PM  Result Value Ref Range   Glucose-Capillary 527 (HH) 65 - 99 mg/dL  Comprehensive metabolic panel     Status: Abnormal   Collection Time: 04/09/18  6:28 PM  Result Value Ref Range   Sodium 131 (L) 135 - 145 mmol/L   Potassium 4.6 3.5 - 5.1 mmol/L   Chloride 90 (L) 101 - 111 mmol/L   CO2 25 22 - 32 mmol/L   Glucose, Bld 385 (H) 65 - 99 mg/dL   BUN 15 6 - 20 mg/dL   Creatinine, Ser 1.05 (H) 0.44 - 1.00 mg/dL   Calcium 9.5 8.9 - 10.3 mg/dL   Total Protein 7.6 6.5 - 8.1 g/dL   Albumin 3.3 (L) 3.5 - 5.0 g/dL   AST 16 15 - 41 U/L   ALT 16 14 - 54 U/L   Alkaline Phosphatase 110 38 - 126 U/L   Total Bilirubin 0.3 0.3 - 1.2 mg/dL   GFR calc non Af Amer 60 (L) >60 mL/min   GFR calc Af Amer >60 >60 mL/min    Comment: (NOTE) The eGFR has been calculated using the CKD EPI equation. This calculation has not been validated in all clinical situations. eGFR's persistently <60 mL/min signify possible Chronic Kidney Disease.    Anion gap 16 (H) 5 - 15    Comment: Performed at [facility], [place] [address], [place], [place] [address]  CBC WITH DIFFERENTIAL     Status: Abnormal   Collection Time: 04/09/18  6:28 PM  Result Value Ref Range   WBC 30.3 (H) 4.0 - 10.5 K/uL   RBC 4.11 3.87 - 5.11 MIL/uL   Hemoglobin 13.0 12.0 - 15.0 g/dL   HCT 39.1 36.0 - 46.0 %   MCV 95.1 78.0 - 100.0 fL   MCH 31.6 26.0 - 34.0 pg   MCHC 33.2 30.0 - 36.0 g/dL   RDW 13.9 11.5 - 15.5 %   Platelets 261 150 - 400 K/uL   Neutrophils Relative % 93 %   Lymphocytes Relative 2 %   Monocytes Relative 5 %   Eosinophils Relative 0 %   Basophils Relative 0 %   Neutro Abs 28.2 (H) 1.7 - 7.7 K/uL   Lymphs Abs 0.6 (L) 0.7 - 4.0 K/uL   Monocytes Absolute 1.5 (H) 0.1 - 1.0 K/uL   Eosinophils Absolute 0.0 0.0 - 0.7 K/uL   Basophils Absolute 0.0 0.0 - 0.1 K/uL   WBC  Morphology WHITE COUNT CONFIRMED ON SMEAR    Smear Review MORPHOLOGY UNREMARKABLE     Comment: Performed at [facility], [place] [address] [place], [place] [address]  Blood Culture (routine x 2)     Status: Abnormal (Preliminary result)   Collection Time: 04/09/18  6:28 PM  Result Value Ref Range   Specimen Description      BLOOD BLOOD LEFT FOREARM Performed at [facility] [address], [place], [place] [address]    Special Requests      BOTTLES DRAWN AEROBIC AND ANAEROBIC Blood Culture adequate volume Performed at [facility] [address], [place], [place] [address]    Culture  Setup Time      GRAM POSITIVE COCCI IN PAIRS AND CHAINS ANAEROBIC BOTTLE ONLY CRITICAL RESULT CALLED TO, READ  BACK BY AND VERIFIED WITH: Marcy Siren PharmD 11:45 04/10/18 ([email or web address])    Culture (A)     GROUP B STREP(S.AGALACTIAE)ISOLATED SUSCEPTIBILITIES TO FOLLOW Performed at [facility], [place] [address] [place], [place] [address]    Report Status PENDING   Urinalysis, Routine w reflex microscopic     Status: Abnormal   Collection Time: 04/09/18  6:28 PM  Result Value Ref Range   Color, Urine YELLOW YELLOW   APPearance HAZY (A) CLEAR   Specific Gravity, Urine 1.022 1.005 - 1.030   pH 6.0 5.0 - 8.0   Glucose, UA >=500 (A) NEGATIVE mg/dL   Hgb urine dipstick NEGATIVE NEGATIVE   Bilirubin Urine NEGATIVE NEGATIVE   Ketones, ur NEGATIVE NEGATIVE mg/dL   Protein, ur NEGATIVE NEGATIVE mg/dL   Nitrite NEGATIVE NEGATIVE   Leukocytes, UA SMALL (A) NEGATIVE   RBC / HPF 0-5 0 - 5 RBC/hpf   WBC, UA TOO NUMEROUS TO COUNT 0 - 5 WBC/hpf   Bacteria, UA RARE (A) NONE SEEN   Squamous Epithelial / LPF 6-30 (A) NONE SEEN    Comment: Performed at [facility], [place] [address] [place], [place] [address]  Urine culture     Status: Abnormal   Collection Time: 04/09/18  6:28 PM  Result Value Ref Range   Specimen Description       URINE, RANDOM Performed at [facility] [address] [place], [place] [address]    Special Requests      URINE, RANDOM Performed at [facility] [address], [place], [place] [address]    Culture MULTIPLE SPECIES PRESENT, SUGGEST RECOLLECTION (A)    Report Status 04/11/2018 FINAL   Urine rapid drug screen (hosp performed)     Status: None   Collection Time: 04/09/18  6:28 PM  Result Value Ref Range   Opiates NONE DETECTED NONE DETECTED   Cocaine NONE DETECTED NONE DETECTED   Benzodiazepines NONE DETECTED NONE DETECTED   Amphetamines NONE DETECTED NONE DETECTED   Tetrahydrocannabinol NONE DETECTED NONE DETECTED   Barbiturates NONE DETECTED NONE DETECTED    Comment: (NOTE) DRUG SCREEN FOR MEDICAL PURPOSES ONLY.  IF CONFIRMATION IS NEEDED FOR ANY PURPOSE, NOTIFY LAB WITHIN 5 DAYS. LOWEST DETECTABLE LIMITS FOR URINE DRUG SCREEN Drug Class                     Cutoff (ng/mL) Amphetamine and metabolites    1000 Barbiturate and metabolites    200 Benzodiazepine                 212 Tricyclics and metabolites     300 Opiates and metabolites        300 Cocaine and metabolites        300 THC                            50 Performed at [facility], [place] [address], [place], [place] [address]   Blood Culture ID Panel (Reflexed)     Status: Abnormal   Collection Time: 04/09/18  6:28 PM  Result Value Ref Range   Enterococcus species NOT DETECTED NOT DETECTED   Listeria monocytogenes NOT DETECTED NOT DETECTED   Staphylococcus species NOT DETECTED NOT DETECTED   Staphylococcus aureus NOT DETECTED NOT DETECTED   Streptococcus species DETECTED (A) NOT DETECTED    Comment: CRITICAL RESULT CALLED TO, READ BACK BY AND VERIFIED WITH:  Marcy Siren PharmD 11:45 04/10/18 ([email or web address])    Streptococcus agalactiae DETECTED (A) NOT DETECTED    Comment: CRITICAL RESULT CALLED TO, READ BACK BY AND VERIFIED WITH: Marcy Siren PharmD 11:45 04/10/18  ([email or web address])    Streptococcus pneumoniae NOT DETECTED NOT DETECTED   Streptococcus pyogenes NOT DETECTED NOT DETECTED   Acinetobacter baumannii NOT DETECTED NOT DETECTED   Enterobacteriaceae species NOT DETECTED NOT DETECTED   Enterobacter cloacae complex NOT DETECTED NOT DETECTED   Escherichia coli NOT DETECTED NOT DETECTED   Klebsiella oxytoca NOT DETECTED NOT DETECTED   Klebsiella pneumoniae NOT DETECTED NOT DETECTED   Proteus species NOT DETECTED NOT DETECTED   Serratia marcescens NOT DETECTED NOT DETECTED   Haemophilus influenzae NOT DETECTED NOT DETECTED   Neisseria meningitidis NOT DETECTED NOT DETECTED   Pseudomonas aeruginosa NOT DETECTED NOT DETECTED   Candida albicans NOT DETECTED NOT DETECTED   Candida glabrata NOT DETECTED NOT DETECTED   Candida krusei NOT DETECTED NOT DETECTED   Candida parapsilosis NOT DETECTED NOT DETECTED   Candida tropicalis NOT DETECTED NOT DETECTED    Comment: Performed at [facility] [address], [place], [place] [address]  Blood gas, venous ([facility], [facility], [place])     Status: Abnormal   Collection Time: 04/09/18  6:40 PM  Result Value Ref Range   FIO2 21.00    Delivery systems ROOM AIR    pH, Ven 7.416 7.250 - 7.430   pCO2, Ven 47.0 44.0 - 60.0 mmHg   pO2, Ven 32.6 32.0 - 45.0 mmHg   Bicarbonate 29.6 (H) 20.0 - 28.0 mmol/L   Acid-Base Excess 4.8 (H) 0.0 - 2.0 mmol/L   O2 Saturation 57.9 %   Patient temperature 98.6    Collection site VEIN    Drawn by RN    Sample type VEIN     Comment: Performed at [facility], [place] [address], [place], [place] [address]  I-Stat CG4 Lactic Acid, ED  (not at  [facility])     Status: Abnormal   Collection Time: 04/09/18  6:43 PM  Result Value Ref Range   Lactic Acid, Venous 3.40 (HH) 0.5 - 1.9 mmol/L   Comment NOTIFIED PHYSICIAN   I-Stat CG4 Lactic Acid, ED  (not at  [facility])     Status: None   Collection Time: 04/09/18  8:57 PM  Result Value Ref Range   Lactic Acid, Venous 1.77 0.5 - 1.9  mmol/L  Glucose, capillary     Status: Abnormal   Collection Time: 04/09/18 11:31 PM  Result Value Ref Range   Glucose-Capillary 126 (H) 65 - 99 mg/dL  Lactic acid, plasma     Status: None   Collection Time: 04/09/18 11:33 PM  Result Value Ref Range   Lactic Acid, Venous 1.2 0.5 - 1.9 mmol/L    Comment: Performed at [facility], [place] [address], [place], [place] [address]  Lactic acid, plasma     Status: None   Collection Time: 04/10/18  2:51 AM  Result Value Ref Range   Lactic Acid, Venous 0.8 0.5 - 1.9 mmol/L    Comment: Performed at [facility], [place] [address] [place], [place] [address]  Glucose, capillary     Status: Abnormal   Collection Time: 04/10/18  3:51 AM  Result Value Ref Range   Glucose-Capillary 146 (H) 65 - 99 mg/dL  HIV antibody (Routine Testing)     Status: None   Collection Time: 04/10/18  5:42 AM  Result Value Ref  Range   HIV Screen 4th Generation wRfx Non Reactive Non Reactive    Comment: (NOTE) Performed At: [facility] [place], [place] [id number] Rush Farmer MD QG:[id number] Performed at [facility], [place] [address], [place], [place] [address]   CBC     Status: Abnormal   Collection Time: 04/10/18  5:42 AM  Result Value Ref Range   WBC 22.8 (H) 4.0 - 10.5 K/uL   RBC 3.33 (L) 3.87 - 5.11 MIL/uL   Hemoglobin 10.5 (L) 12.0 - 15.0 g/dL   HCT 32.0 (L) 36.0 - 46.0 %   MCV 96.1 78.0 - 100.0 fL   MCH 31.5 26.0 - 34.0 pg   MCHC 32.8 30.0 - 36.0 g/dL   RDW 14.1 11.5 - 15.5 %   Platelets 221 150 - 400 K/uL    Comment: Performed at [facility], [place] [address], [place], [place] [address]  Comprehensive metabolic panel     Status: Abnormal   Collection Time: 04/10/18  5:42 AM  Result Value Ref Range   Sodium 132 (L) 135 - 145 mmol/L   Potassium 3.8 3.5 - 5.1 mmol/L    Comment: DELTA CHECK NOTED REPEATED TO VERIFY    Chloride 97 (L) 101 - 111 mmol/L    CO2 25 22 - 32 mmol/L   Glucose, Bld 166 (H) 65 - 99 mg/dL   BUN 14 6 - 20 mg/dL   Creatinine, Ser 0.84 0.44 - 1.00 mg/dL   Calcium 8.1 (L) 8.9 - 10.3 mg/dL   Total Protein 6.0 (L) 6.5 - 8.1 g/dL   Albumin 2.4 (L) 3.5 - 5.0 g/dL   AST 10 (L) 15 - 41 U/L   ALT 11 (L) 14 - 54 U/L   Alkaline Phosphatase 87 38 - 126 U/L   Total Bilirubin 0.5 0.3 - 1.2 mg/dL   GFR calc non Af Amer >60 >60 mL/min   GFR calc Af Amer >60 >60 mL/min    Comment: (NOTE) The eGFR has been calculated using the CKD EPI equation. This calculation has not been validated in all clinical situations. eGFR's persistently <60 mL/min signify possible Chronic Kidney Disease.    Anion gap 10 5 - 15    Comment: Performed at [facility], [place] [address] [place], [place] [address]  Glucose, capillary     Status: Abnormal   Collection Time: 04/10/18  8:10 AM  Result Value Ref Range   Glucose-Capillary 121 (H) 65 - 99 mg/dL  Glucose, capillary     Status: Abnormal   Collection Time: 04/10/18 11:36 AM  Result Value Ref Range   Glucose-Capillary 143 (H) 65 - 99 mg/dL  Glucose, capillary     Status: Abnormal   Collection Time: 04/10/18  4:18 PM  Result Value Ref Range   Glucose-Capillary 213 (H) 65 - 99 mg/dL  Glucose, capillary     Status: Abnormal   Collection Time: 04/10/18  7:54 PM  Result Value Ref Range   Glucose-Capillary 226 (H) 65 - 99 mg/dL  Glucose, capillary     Status: Abnormal   Collection Time: 04/10/18 10:57 PM  Result Value Ref Range   Glucose-Capillary 212 (H) 65 - 99 mg/dL  Glucose, capillary     Status: Abnormal   Collection Time: 04/11/18  4:23 AM  Result Value Ref Range   Glucose-Capillary 190 (H) 65 - 99 mg/dL  Creatinine, serum     Status: None   Collection Time: 04/11/18  5:53 AM  Result Value Ref Range   Creatinine, Ser 0.64 0.44 - 1.00 mg/dL   GFR calc non Af Amer >60 >60 mL/min   GFR calc Af Amer >60 >60 mL/min    Comment: (NOTE) The eGFR has been calculated using  the CKD EPI equation. This calculation has not been validated in all clinical situations. eGFR's persistently <60 mL/min signify possible Chronic Kidney Disease. Performed at [facility], [place] [address], [place], [place] [address]   Glucose, capillary     Status: Abnormal   Collection Time: 04/11/18  7:40 AM  Result Value Ref Range   Glucose-Capillary 140 (H) 65 - 99 mg/dL  Glucose, capillary     Status: Abnormal   Collection Time: 04/11/18 11:58 AM  Result Value Ref Range   Glucose-Capillary 205 (H) 65 - 99 mg/dL    Current Facility-Administered Medications  Medication Dose Route Frequency Provider Last Rate Last Dose  . 0.9 %  sodium chloride infusion   Intravenous Continuous Hugelmeyer, Alexis, DO 100 mL/hr at 04/11/18 0654    . acetaminophen (TYLENOL) tablet 650 mg  650 mg Oral Q6H PRN Hugelmeyer, Alexis, DO   650 mg at 04/10/18 2016   Or  . acetaminophen (TYLENOL) suppository 650 mg  650 mg Rectal Q6H PRN Hugelmeyer, Alexis, DO      . albuterol (PROVENTIL) (2.5 MG/3ML) 0.083% nebulizer solution 2.5 mg  2.5 mg Nebulization Q6H PRN Hugelmeyer, Alexis, DO      . bisacodyl (DULCOLAX) EC tablet 5 mg  5 mg Oral Daily PRN Hugelmeyer, Alexis, DO      . cefTRIAXone (ROCEPHIN) 2 g in sodium chloride 0.9 % 100 mL IVPB  2 g Intravenous Q24H Florencia Reasons, MD      . enoxaparin (LOVENOX) injection 40 mg  40 mg Subcutaneous QHS Hugelmeyer, Alexis, DO   40 mg at 04/10/18 2058  . gabapentin (NEURONTIN) capsule 600 mg  600 mg Oral BID Hosie Poisson, MD   600 mg at 04/11/18 1009  . guaiFENesin-dextromethorphan (ROBITUSSIN DM) 100-10 MG/5ML syrup 5 mL  5 mL Oral Q4H PRN Hosie Poisson, MD   5 mL at 04/11/18 0819  . insulin aspart (novoLOG) injection 0-15 Units  0-15 Units Subcutaneous Q4H Hugelmeyer, Alexis, DO   5 Units at 04/11/18 1209  . insulin glargine (LANTUS) injection 36 Units  36 Units Subcutaneous QAC breakfast Hugelmeyer, Alexis, DO   36 Units at 04/11/18 0814  . ipratropium  (ATROVENT) nebulizer solution 0.5 mg  0.5 mg Nebulization Q6H PRN Hugelmeyer, Alexis, DO      . lidocaine (LIDODERM) 5 % 1 patch  1 patch Transdermal Daily PRN Hugelmeyer, Alexis, DO      . lip balm (CARMEX) ointment   Topical PRN Hugelmeyer, Alexis, DO      . magnesium citrate solution 1 Bottle  1 Bottle Oral Once PRN Hugelmeyer, Alexis, DO      . metroNIDAZOLE (FLAGYL) IVPB 500 mg  500 mg Intravenous Blair Promise, MD      . nicotine (NICODERM CQ - dosed in mg/24 hours) patch 21 mg  21 mg Transdermal Daily Hosie Poisson, MD   21 mg at 04/11/18 1010  . ondansetron (ZOFRAN) tablet 4 mg  4 mg Oral Q6H PRN Hugelmeyer, Alexis, DO       Or  . ondansetron (ZOFRAN) injection 4 mg  4 mg Intravenous Q6H PRN Hugelmeyer, Alexis, DO      . oxyCODONE-acetaminophen (PERCOCET/ROXICET) 5-325 MG per tablet 1-2 tablet  1-2 tablet Oral Q6H PRN  Hugelmeyer, Alexis, DO   1 tablet at 04/11/18 [phone]  . QUEtiapine (SEROQUEL) tablet 600 mg  600 mg Oral QHS Hosie Poisson, MD      . rOPINIRole (REQUIP) tablet 2 mg  2 mg Oral Daily Hosie Poisson, MD   2 mg at 04/11/18 1009  . senna-docusate (Senokot-S) tablet 1 tablet  1 tablet Oral QHS PRN Hugelmeyer, Alexis, DO      . sodium chloride flush (NS) 0.9 % injection 3 mL  3 mL Intravenous Q12H Hugelmeyer, Alexis, DO   3 mL at 04/11/18 1012    Musculoskeletal: Strength & Muscle Tone: within normal limits Gait & Station: unable to stand Patient leans: N/A  Psychiatric Specialty Exam: Physical Exam  Constitutional: She is oriented to person, place, and time. She appears well-developed and well-nourished.  HENT:  Head: Normocephalic.  Neck: Normal range of motion.  Respiratory: Effort normal.  Musculoskeletal: Normal range of motion.  Neurological: She is alert and oriented to person, place, and time.  Psychiatric: Her speech is normal and behavior is normal. Judgment and thought content normal. Her mood appears anxious. Cognition and memory are normal. She exhibits a  depressed mood.    Review of Systems  Psychiatric/Behavioral: Positive for depression. The patient is nervous/anxious.   All other systems reviewed and are negative.   Blood pressure 113/64, pulse 85, temperature 98.6 F (37 C), resp. rate 20, height _0  (1.651 m), weight 96.2 kg (212 lb 1.3 oz), SpO2 94 %.Body mass index is 35.29 kg/m.  General Appearance: Casual  Eye Contact:  Good  Speech:  Normal Rate  Volume:  Normal  Mood:  Anxious and Depressed  Affect:  Congruent  Thought Process:  Coherent and Descriptions of Associations: Intact  Orientation:  Full (Time, Place, and Person)  Thought Content:  WDL and Logical  Suicidal Thoughts:  No  Homicidal Thoughts:  No  Memory:  Immediate;   Good Recent;   Good Remote;   Good  Judgement:  Good  Insight:  Good  Psychomotor Activity:  Normal  Concentration:  Concentration: Good and Attention Span: Good  Recall:  Good  Fund of Knowledge:  Good  Language:  Good  Akathisia:  No  Handed:  Right  AIMS (if indicated):     Assets:  Leisure Time Physical Health Resilience Social Support  ADL's:  Intact  Cognition:  WNL  Sleep:        Treatment Plan Summary: Daily contact with patient to assess and evaluate symptoms and progress in treatment, Medication management and Plan major depressive disorder, recurrent, mild:  -Start Celexa 10 mg daily for depression and anxiety -Start hydroxyzine 25 mg TID PRN anxiety  Disposition: No evidence of imminent risk to self or others at present.    Waylan Boga, NP 04/11/2018 1:18 PM

## 2018-04-11 NOTE — Telephone Encounter (Signed)
Pt husband has called and wanted to know when Dr. March Rummage was coming to [facility] to see her.

## 2018-04-11 NOTE — Brief Op Note (Signed)
04/11/2018  7:58 PM  PATIENT:  [name]  52 y.o. female  PRE-OPERATIVE DIAGNOSIS:  infection right foot  POST-OPERATIVE DIAGNOSIS:  infection right foot  PROCEDURE:  Procedure(s): IRRIGATION AND DEBRIDEMENT WOUND right foot and right ankle (Right)  SURGEON:  Surgeon(s) and Role:    * Evelina Bucy, DPM - Primary  PHYSICIAN ASSISTANT:   ASSISTANTS: none   ANESTHESIA:   local and MAC  EBL:  20 mL   BLOOD ADMINISTERED:none  DRAINS: none   LOCAL MEDICATIONS USED:  MARCAINE    and Amount: 20 ml  SPECIMEN:  Source of Specimen:  R Foot Soft Tissue and bone  DISPOSITION OF SPECIMEN:  Pathology and Micro  COUNTS:  YES  TOURNIQUET:  * Missing tourniquet times found for documented tourniquets in log: [id number] * None  DICTATION: .Note written in EPIC  PLAN OF CARE: Transfer to floor  PATIENT DISPOSITION:  PACU - hemodynamically stable.   Delay start of Pharmacological VTE agent (>24hrs) due to surgical blood loss or risk of bleeding: not applicable

## 2018-04-11 NOTE — Progress Notes (Signed)
Inpatient Diabetes Program Recommendations  AACE/ADA: New Consensus Statement on Inpatient Glycemic Control (2015)  Target Ranges:  Prepandial:   less than 140 mg/dL      Peak postprandial:   less than 180 mg/dL (1-2 hours)      Critically ill patients:  140 - 180 mg/dL   Lab Results  Component Value Date   GLUCAP 205 (H) 04/11/2018   HGBA1C 9.2 03/19/2018    Review of Glycemic Control  Diabetes history: DM2 Outpatient Diabetes medications: Basaglar 36 units QD, Novolog 10 units at B and L, 35 units at dinner, Jardiance 25 mg QD Current orders for Inpatient glycemic control: Lantus 36 units QD, Novolog 0-15 units Q4H  HgbA1C - 9.2%.   Inpatient Diabetes Program Recommendations:     Add Novolog 4 units tidwc for meal coverage insulin when po's restart.  Spoke with pt and husband regarding HgbA1C of 9.2%. Pt sees Dr. Dwyane Dee on regular basis and states she checks blood sugars at least 4x/day. Discussed importance of reducing HgbA1C to prevent long and short-term complications. Answered questions. Verbalized understanding.   Thank you. Lorenda Peck, RD, LDN, CDE Inpatient Diabetes Coordinator [phone]

## 2018-04-11 NOTE — Consult Note (Signed)
Subjective:  Patient ID: [name], female    DOB: September 20, 1966,  MRN: [id number]  Chief Complaint  Patient presents with  . Altered Mental Status  . Hyperglycemia   Reason for Consult: R Foot Wound  52 y.o. female admitted for hte about. Consult requested by Dr. Karleen Hampshire. Patient normally follows with Dr. Cannon Kettle for her R foot wound. Was scheduled to undergo bony exostectomy on Monday. Notes onset of confusion, weakness and worsening R foot wound. Patient alert and oriented. Denies N/V/F/CH.  Ate full breakfast at 8AM.  Past Medical History:  Diagnosis Date  . Anxiety   . Arthritis   . Bipolar disorder ([place])   . Broken jaw ([place])   . Broken wrist   . Chronic back pain   . Claudication ([place])    a. 12/2013 ABI's: R 0.97, L 0.94.  [name] COPD (chronic obstructive pulmonary disease) ([place])   . Depression   . Diabetic Charcot's foot ([place])   . Diabetic foot ulcer ([facility])    chronic left foot ulcer , great toe/  hx recurrent foot ulcer bilaterally  . DJD (degenerative joint disease)   . Edema of both lower extremities   . Episode of memory loss   . GERD (gastroesophageal reflux disease)   . Headache(784.0)   . Hiatal hernia   . History of colon cancer, stage I dx 02-26-2015--- oncologist-- dr [name]--- per last note no recurrence   05-16-2015 s/p  Laparoscopic sigmoid colectomy w/ node bx's (negative nodes per path)  Stage I (pT1,N0,M0) Grade 2  . 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   per MRI multiple small strokes post cardiac cath. --  residual mild memory loss  . History of methicillin resistant staphylococcus aureus (MRSA)   . Hypercholesterolemia   . Insomnia   . Insulin dependent type 2 diabetes mellitus, uncontrolled ([place]) dx 2004   endocrinologist-  dr Dwyane Dee-- last A1c 8.8 in Aug2018:  pt is noncompliant w/ diet, states does note eat breakfast , her first main meal in afternoon  . Neurogenic bladder   . Neuropathy, diabetic ([place])    hands and  feet  . OSA (obstructive sleep apnea)    cpap intolerant  . Personality disorder ([place])   . SOB (shortness of breath) on exertion   . Stroke ([place])   . SUI (stress urinary incontinence, female) S/P SLING 12-29-2011   Past Surgical History:  Procedure Laterality Date  . ABDOMINAL HYSTERECTOMY    . ANTERIOR CERVICAL DECOMP/DISCECTOMY FUSION  2000   C5 - 7  . CARDIAC CATHETERIZATION  05-22-2008   DR SKAINS   NO SIGNIFECANT CAD/ NORMAL LV/  EF 65%/  NO WALL MOTION ABNORMALITIES  . CARPAL TUNNEL RELEASE Right 04-25-2013  . [place]; 1992  . COLONOSCOPY    . CYSTO N/A 04/30/2013   Procedure: CYSTOSCOPY;  Surgeon: Reece Packer, MD;  Location: [facility];  Service: Urology;  Laterality: N/A;  . CYSTOSCOPY MACROPLASTIQUE IMPLANT N/A 02/06/2018   Procedure: CYSTOSCOPY MACROPLASTIQUE IMPLANT;  Surgeon: Bjorn Loser, MD;  Location: [facility];  Service: Urology;  Laterality: N/A;  . CYSTOSCOPY WITH INJECTION  05/04/2012   Procedure: CYSTOSCOPY WITH INJECTION;  Surgeon: Reece Packer, MD;  Location: [facility];  Service: Urology;  Laterality: N/A;  MACROPLASTIQUE INJECTION  . CYSTOSCOPY WITH INJECTION  08/28/2012   Procedure: CYSTOSCOPY WITH INJECTION;  Surgeon: Reece Packer, MD;  [facility];  Service: Urology;  Laterality: N/A;  cysto and macroplastique   . FOOT SURGERY Bilateral   . HERNIA REPAIR  ?1996   "stomach"  . KNEE ARTHROSCOPY W/ ALLOGRAFT IMPANT Left    graft x 2  . KNEE SURGERY     TOTAL 8 SURG'S  . LAPAROSCOPIC SIGMOID COLECTOMY N/A 04/22/2015   Procedure: LAPAROSCOPIC HAND ASSISTED SIGMOID COLECTOMY;  Surgeon: Erroll Luna, MD;  Location: [facility];  Service: General;  Laterality: N/A;  . LEFT HEART CATHETERIZATION WITH CORONARY ANGIOGRAM N/A 02/12/2014   Procedure: LEFT HEART CATHETERIZATION WITH CORONARY ANGIOGRAM;  Surgeon: Candee Furbish, MD;  Location: [facility] C[facility] LAB;  Service: Cardiovascular;  Laterality:  N/A;   No angiographically significant CAD; normal LVSF, LVEDP 18mmHg,  EF 55% (new finding ef 30% myoview 01-08-2014)  . LUMBAR FUSION    . MANDIBLE FRACTURE SURGERY    . MULTIPLE LAPAROSCOPIES FOR ENDOMETRIOSIS    . PUBOVAGINAL SLING  12/29/2011   Procedure: Gaynelle Arabian;  Surgeon: Reece Packer, MD;  Location: [facility];  Service: Urology;  Laterality: N/A;  cysto and sparc sling   . PUBOVAGINAL SLING N/A 04/30/2013   Procedure: REMOVAL OF VAGINAL MESH;  Surgeon: Reece Packer, MD;  Location: [facility];  Service: Urology;  Laterality: N/A;  . RECONSTURCTION OF CONGENITAL UTERUS ANOMALY  1983  . REPEAT RECONSTRUCTION ACL LEFT KNEE/ SCREWS REMOVED  03-28-2000   CADAVER GRAFT  . TOTAL ABDOMINAL HYSTERECTOMY W/ BILATERAL SALPINGOOPHORECTOMY  1997  . TRANSTHORACIC ECHOCARDIOGRAM  02/13/2014   ef 45%, hypokinesis base inferior and base inferolateral walls  . UPPER GI ENDOSCOPY    . WOUND DEBRIDEMENT Right 09/05/2016   Procedure: DEBRIDEMENT WOUND WITH GRAFT RIGHT FOOT;  Surgeon: Landis Martins, DPM;  Location: [place];  Service: Podiatry;  Laterality: Right;  . WRIST FRACTURE SURGERY      Current Facility-Administered Medications:  .  0.9 %  sodium chloride infusion, , Intravenous, Continuous, Hugelmeyer, Alexis, DO, Last Rate: 100 mL/hr at 04/11/18 0654 .  acetaminophen (TYLENOL) tablet 650 mg, 650 mg, Oral, Q6H PRN, 650 mg at 04/10/18 2016 **OR** acetaminophen (TYLENOL) suppository 650 mg, 650 mg, Rectal, Q6H PRN, Hugelmeyer, Alexis, DO .  albuterol (PROVENTIL) (2.5 MG/3ML) 0.083% nebulizer solution 2.5 mg, 2.5 mg, Nebulization, Q6H PRN, Hugelmeyer, Alexis, DO .  bisacodyl (DULCOLAX) EC tablet 5 mg, 5 mg, Oral, Daily PRN, Hugelmeyer, Alexis, DO .  cefTRIAXone (ROCEPHIN) 2 g in sodium chloride 0.9 % 100 mL IVPB, 2 g, Intravenous, Q24H, Florencia Reasons, MD .  enoxaparin (LOVENOX) injection 40 mg, 40 mg, Subcutaneous, QHS, Hugelmeyer, Alexis, DO, 40 mg at 04/10/18 2058 .   gabapentin (NEURONTIN) capsule 600 mg, 600 mg, Oral, BID, Hosie Poisson, MD, 600 mg at 04/11/18 1009 .  guaiFENesin-dextromethorphan (ROBITUSSIN DM) 100-10 MG/5ML syrup 5 mL, 5 mL, Oral, Q4H PRN, Hosie Poisson, MD, 5 mL at 04/11/18 0819 .  insulin aspart (novoLOG) injection 0-15 Units, 0-15 Units, Subcutaneous, Q4H, Hugelmeyer, Alexis, DO, 5 Units at 04/11/18 1209 .  insulin glargine (LANTUS) injection 36 Units, 36 Units, Subcutaneous, QAC breakfast, Hugelmeyer, Alexis, DO, 36 Units at 04/11/18 0814 .  ipratropium (ATROVENT) nebulizer solution 0.5 mg, 0.5 mg, Nebulization, Q6H PRN, Hugelmeyer, Alexis, DO .  lidocaine (LIDODERM) 5 % 1 patch, 1 patch, Transdermal, Daily PRN, Hugelmeyer, Alexis, DO .  lip balm (CARMEX) ointment, , Topical, PRN, Hugelmeyer, Alexis, DO .  magnesium citrate solution 1 Bottle, 1 Bottle, Oral, Once PRN, Hugelmeyer, Alexis, DO .  metroNIDAZOLE (FLAGYL) IVPB 500 mg, 500 mg, Intravenous, Q8H,  Florencia Reasons, MD .  nicotine (NICODERM CQ - dosed in mg/24 hours) patch 21 mg, 21 mg, Transdermal, Daily, Hosie Poisson, MD, 21 mg at 04/11/18 1010 .  ondansetron (ZOFRAN) tablet 4 mg, 4 mg, Oral, Q6H PRN **OR** ondansetron (ZOFRAN) injection 4 mg, 4 mg, Intravenous, Q6H PRN, Hugelmeyer, Alexis, DO .  oxyCODONE-acetaminophen (PERCOCET/ROXICET) 5-325 MG per tablet 1-2 tablet, 1-2 tablet, Oral, Q6H PRN, Hugelmeyer, Alexis, DO, 1 tablet at 04/11/18 0819 .  QUEtiapine (SEROQUEL) tablet 600 mg, 600 mg, Oral, QHS, Akula, Vijaya, MD .  rOPINIRole (REQUIP) tablet 2 mg, 2 mg, Oral, Daily, Hosie Poisson, MD, 2 mg at 04/11/18 1009 .  senna-docusate (Senokot-S) tablet 1 tablet, 1 tablet, Oral, QHS PRN, Hugelmeyer, Alexis, DO .  sodium chloride flush (NS) 0.9 % injection 3 mL, 3 mL, Intravenous, Q12H, Hugelmeyer, Alexis, DO, 3 mL at 04/11/18 1012  Allergies  Allergen Reactions  . Latex Itching, Rash and Other (See Comments)    Pt states she cannot use condoms - cause an infection.  Use of latex on skin  is okay.  Tape causes rash  . Sweetening Enhancer [Flavoring Agent] Nausea And Vomiting and Other (See Comments)    HEADACHES  . Aspartame And Phenylalanine Nausea And Vomiting    HEADACHES  . Ibuprofen Other (See Comments)    HEADACHES  . Trazodone And Nefazodone Other (See Comments)    Hallucinations   . Triazolam Other (See Comments)    HALLUCINATIONS   Review of Systems: Negative except as noted in the HPI. Denies N/V/F/Ch. Objective:   Vitals:   04/10/18 2254 04/11/18 0425  BP:  113/64  Pulse:  85  Resp:  20  Temp: 99.3 F (37.4 C) 98.6 F (37 C)  SpO2:  94%   General AA&O x3. Normal mood and affect.  Vascular Dorsalis pedis and posterior tibial pulses  present 2+ bilaterally  Capillary refill normal to all digits. Pedal hair growth normal.  Neurologic Epicritic sensation grossly diminished..  Dermatologic  Large plantar foot bulla, slight active purulent drainage. Periwound erythema. Erythema proximal to the distal 1/3 of the leg. Marked, appears to be slightly receding.  IDSA severe infection. L hallux unstageable pressure ulcer with overlying hyperkeratosis. No erythema. No signs of acute infection.  Orthopedic: MMT 5/5 in dorsiflexion, plantarflexion, inversion, and eversion. Normal joint ROM without pain or crepitus. Charcot foot type R foot.   Results for orders placed or performed during the hospital encounter of 04/09/18 (from the past 24 hour(s))  Glucose, capillary     Status: Abnormal   Collection Time: 04/10/18  4:18 PM  Result Value Ref Range   Glucose-Capillary 213 (H) 65 - 99 mg/dL  Glucose, capillary     Status: Abnormal   Collection Time: 04/10/18  7:54 PM  Result Value Ref Range   Glucose-Capillary 226 (H) 65 - 99 mg/dL  Glucose, capillary     Status: Abnormal   Collection Time: 04/10/18 10:57 PM  Result Value Ref Range   Glucose-Capillary 212 (H) 65 - 99 mg/dL  Glucose, capillary     Status: Abnormal   Collection Time: 04/11/18  4:23 AM    Result Value Ref Range   Glucose-Capillary 190 (H) 65 - 99 mg/dL  Creatinine, serum     Status: None   Collection Time: 04/11/18  5:53 AM  Result Value Ref Range   Creatinine, Ser 0.64 0.44 - 1.00 mg/dL   GFR calc non Af Amer >60 >60 mL/min   GFR calc Af Amer >60 >60  mL/min  Glucose, capillary     Status: Abnormal   Collection Time: 04/11/18  7:40 AM  Result Value Ref Range   Glucose-Capillary 140 (H) 65 - 99 mg/dL  Glucose, capillary     Status: Abnormal   Collection Time: 04/11/18 11:58 AM  Result Value Ref Range   Glucose-Capillary 205 (H) 65 - 99 mg/dL    Assessment & Plan:  Patient was evaluated and treated and all questions answered.  Diabetic Foot Ulcer R Foot, Hx of Charcot Foot, Possible R Cuboid Osteomyelitis -Labs and MRI reviewed. -Large infected bulla with concern for abscess. Discussed operative incision and drainage tonight. Patient last ate at [place]. Patient understands plan for OR tonight. -Called patient's husband and discussed same. -Added on for OR this early evening. -Continue empiric Abx. Will obtain intra-op cultures for abx reccs. -NWB RLE  Podiatry will continue to follow.  Evelina Bucy, DPM

## 2018-04-11 NOTE — Progress Notes (Signed)
PROGRESS NOTE  [name] FGH:[id number] DOB: 16-Oct-1966 DOA: 04/09/2018 PCP: Tamsen Roers, MD  HPI/Recap of past 24 hours:  Continue to be febrile, c/o foot pain with weight bearing Husband at bedside  Assessment/Plan: Active Problems:   Sepsis due to cellulitis ([place])   Major depressive disorder, recurrent episode ([place])  Sepsis secondary to diabetic foot infection, with early osteomyelitis of the right foot/ streptococcal bacteremia -she presented with metabolic encephalopathy, uds negative, CT head negative for acute findings -she was started on vanc/zosyn on admission, abx change to rocephin /flagyl to cover strep and anaerobes. 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 I donot see metformin on her meds list either  Peripheral oneuropathy: Continue neurontin  CKDII Renal function stable at baseline Renal dosing meds  COPD/current everyday smoker No wheezing today, no hypixa cxr on presentation no acute findings Smoking cessation education >41mins provided, nicotine patch   H/o post cath CVA per patient H/o nonischemic cardiomyopathy  Depression/anxiety/sleep disorder/poly pharmacy No si/hi Psychiatry input appreciated Per psych: Avoid valium, start celexa and hydroxyzine There are mulitple psych meds listed on her home meds list, will   Code Status: full  Family Communication: patient and husband at bedside  Disposition Plan: not ready to discharge   Consultants:  podiatry  Procedures:  Debridement on 4/17  Antibiotics:  Vanc/zosyn on admission  Rocephin/flagyl from 4/17   Objective: BP (!) 165/76 (BP Location: Right Arm)   Pulse  (!) 115   Temp (!) 102.1 F (38.9 C) (Oral)   Resp 19   Ht 5\' 5"  (1.651 m)   Wt 96.2 kg (212 lb 1.3 oz)   SpO2 100%   BMI 35.29 kg/m   Intake/Output Summary (Last 24 hours) at 04/11/2018 1548 Last data filed at 04/11/2018 1036 Gross per 24 hour  Intake 2320 ml  Output -  Net 2320 ml   Filed Weights   04/09/18 2251  Weight: 96.2 kg (212 lb 1.3 oz)    Exam: Patient is examined daily including today on 04/11/2018, exams remain the same as of yesterday except that has changed    General:  NAD  Cardiovascular: RRR  Respiratory: CTABL  Abdomen: Soft/ND/NT, positive BS  Musculoskeletal: bilateral foot ulcer, charcot foot, see pic below. She has decreased sensation bilateral foot, palpable pedal pulse  Neuro: alert, oriented    Right foot, pic taken on 4/17    Left foot, pic taken on 4/17  Data Reviewed: Basic Metabolic Panel: Recent Labs  Lab 04/09/18 1828 04/10/18 0542 04/11/18 0553  NA 131* 132*  --   K 4.6 3.8  --   CL 90* 97*  --   CO2 25 25  --   GLUCOSE 385* 166*  --   BUN 15 14  --   CREATININE 1.05* 0.84 0.64  CALCIUM 9.5 8.1*  --    Liver Function Tests: Recent Labs  Lab 04/09/18 1828 04/10/18 0542  AST 16 10*  ALT 16 11*  ALKPHOS 110 87  BILITOT 0.3 0.5  PROT 7.6 6.0*  ALBUMIN 3.3* 2.4*   No results for input(s): LIPASE, AMYLASE in the last 168 hours. No results for input(s): AMMONIA in the last 168 hours. CBC: Recent Labs  Lab  04/09/18 1828 04/10/18 0542  WBC 30.3* 22.8*  NEUTROABS 28.2*  --   HGB 13.0 10.5*  HCT 39.1 32.0*  MCV 95.1 96.1  PLT 261 221   Cardiac Enzymes:   No results for input(s): CKTOTAL, CKMB, CKMBINDEX, TROPONINI in the last 168 hours. BNP (last 3 results) No results for input(s): BNP in the last 8760 hours.  ProBNP (last 3 results) No results for input(s): PROBNP in the last 8760 hours.  CBG: Recent Labs  Lab 04/10/18 1954 04/10/18 2257 04/11/18 0423 04/11/18 0740 04/11/18 1158  GLUCAP 226*  212* 190* 140* 205*    Recent Results (from the past 240 hour(s))  Blood Culture (routine x 2)     Status: Abnormal (Preliminary result)   Collection Time: 04/09/18  6:28 PM  Result Value Ref Range Status   Specimen Description   Final    BLOOD BLOOD LEFT FOREARM Performed at [facility] [address] [place], [place] [address]    Special Requests   Final    BOTTLES DRAWN AEROBIC AND ANAEROBIC Blood Culture adequate volume Performed at [facility] [address], [place], [place] [address]    Culture  Setup Time   Final    GRAM POSITIVE COCCI IN PAIRS AND CHAINS ANAEROBIC BOTTLE ONLY CRITICAL RESULT CALLED TO, READ BACK BY AND VERIFIED WITH: Marcy Siren PharmD 11:45 04/10/18 ([email or web address])    Culture (A)  Final    GROUP B STREP(S.AGALACTIAE)ISOLATED SUSCEPTIBILITIES TO FOLLOW Performed at [facility], [place] [address], [place], [place] [address]    Report Status PENDING  Incomplete  Urine culture     Status: Abnormal   Collection Time: 04/09/18  6:28 PM  Result Value Ref Range Status   Specimen Description   Final    URINE, RANDOM Performed at [facility] [address], [place], [place] [address]    Special Requests   Final    URINE, RANDOM Performed at [facility] [address] [place], [place] [address]    Culture MULTIPLE SPECIES PRESENT, SUGGEST RECOLLECTION (A)  Final   Report Status 04/11/2018 FINAL  Final  Blood Culture ID Panel (Reflexed)     Status: Abnormal   Collection Time: 04/09/18  6:28 PM  Result Value Ref Range Status   Enterococcus species NOT DETECTED NOT DETECTED Final   Listeria monocytogenes NOT DETECTED NOT DETECTED Final   Staphylococcus species NOT DETECTED NOT DETECTED Final   Staphylococcus aureus NOT DETECTED NOT DETECTED Final   Streptococcus species DETECTED (A) NOT DETECTED Final    Comment: CRITICAL RESULT CALLED TO, READ BACK BY AND VERIFIED WITH: Marcy Siren PharmD 11:45 04/10/18 ([email or web address])    Streptococcus agalactiae DETECTED (A) NOT DETECTED Final    Comment: CRITICAL RESULT CALLED TO, READ BACK BY AND VERIFIED WITH: Marcy Siren PharmD 11:45 04/10/18 ([email or web address])    Streptococcus pneumoniae NOT DETECTED NOT DETECTED Final   Streptococcus pyogenes NOT DETECTED NOT DETECTED Final   Acinetobacter baumannii NOT DETECTED NOT DETECTED Final   Enterobacteriaceae species NOT DETECTED NOT DETECTED Final   Enterobacter cloacae complex NOT DETECTED NOT DETECTED Final   Escherichia coli NOT DETECTED NOT DETECTED Final   Klebsiella oxytoca NOT DETECTED NOT DETECTED Final   Klebsiella pneumoniae NOT DETECTED NOT DETECTED Final   Proteus species NOT DETECTED NOT DETECTED Final   Serratia marcescens NOT DETECTED NOT DETECTED Final   Haemophilus influenzae NOT DETECTED NOT DETECTED Final   Neisseria meningitidis NOT DETECTED NOT  DETECTED Final   Pseudomonas aeruginosa NOT DETECTED NOT DETECTED Final   Candida albicans NOT DETECTED NOT DETECTED Final   Candida glabrata NOT DETECTED NOT DETECTED Final   Candida krusei NOT DETECTED NOT DETECTED Final   Candida parapsilosis NOT DETECTED NOT DETECTED Final   Candida tropicalis NOT DETECTED NOT DETECTED Final    Comment: Performed at [facility], [place] [address] [place], [place] [address]  Blood Culture (routine x 2)     Status: None (Preliminary result)   Collection Time: 04/09/18 11:33 PM  Result Value Ref Range Status   Specimen Description   Final    BLOOD RIGHT ANTECUBITAL Performed at [facility] [address] [place], [place] [address]    Special Requests   Final    BOTTLES DRAWN AEROBIC AND ANAEROBIC Blood Culture adequate volume Performed at [facility] [address], [place], [place] [address]    Culture   Final    NO GROWTH 1 DAY Performed at [facility], [place] [address] [place], [place] [address]    Report Status PENDING   Incomplete     Studies: Mr Brain Wo Contrast  Result Date: 04/10/2018 CLINICAL DATA:  Altered mental status EXAM: MRI HEAD WITHOUT CONTRAST TECHNIQUE: Multiplanar, multiecho pulse sequences of the brain and surrounding structures were obtained without intravenous contrast. COMPARISON:  Head CT 04/09/2018 FINDINGS: The examination had to be discontinued prior to completion due to patient confusion and inability to cooperate with the technologist's instructions. Axial and coronal diffusion-weighted imaging with corresponding ADC maps, sagittal T1-weighted imaging, axial T2 weighted and FLAIR sequences were obtained. The study is degraded by motion, despite efforts to reduce this artifact, including utilization of motion-resistant MR sequences. The findings of the study are interpreted in the context of reduced sensitivity/specificity. BRAIN: The midline structures are normal. There is no acute infarct or acute hemorrhage. No mass lesion, hydrocephalus, dural abnormality or extra-axial collection. The brain parenchymal signal is normal for the patient's age. No age-advanced or lobar predominant atrophy. No chronic microhemorrhage or superficial siderosis. VASCULAR: Major intracranial arterial and venous sinus flow voids are preserved. SKULL AND UPPER CERVICAL SPINE: The visualized skull base, calvarium, upper cervical spine and extracranial soft tissues are normal. SINUSES/ORBITS: No fluid levels or advanced mucosal thickening. No mastoid or middle ear effusion. Normal orbits. IMPRESSION: Motion degraded and truncated MRI of the brain without visible abnormality. Electronically Signed   By: Ulyses Jarred M.D.   On: 04/10/2018 17:28    Scheduled Meds: . [START ON 04/12/2018] enoxaparin (LOVENOX) injection  40 mg Subcutaneous Q24H  . gabapentin  600 mg Oral BID  . insulin aspart  0-15 Units Subcutaneous Q4H  . insulin glargine  36 Units Subcutaneous QAC breakfast  . nicotine  21 mg Transdermal Daily  .  QUEtiapine  600 mg Oral QHS  . [START ON 04/12/2018] rOPINIRole  2 mg Oral QHS  . sodium chloride flush  3 mL Intravenous Q12H    Continuous Infusions: . sodium chloride 100 mL/hr at 04/11/18 0654  . cefTRIAXone (ROCEPHIN)  IV Stopped (04/11/18 1545)  . metronidazole       Time spent: 35 mins I have personally reviewed and interpreted on  04/11/2018 daily labs, tele strips, imagings as discussed above under date review session and assessment and plans.  I reviewed all nursing notes, pharmacy notes, consultant notes,  vitals, pertinent old records  I have discussed plan of care as described above with RN ,  patient and family on 04/11/2018   Florencia Reasons MD, PhD  [facility] Pager [phone]. If 7PM-7AM, please contact night-coverage at [email or web address], password [email or web address] 04/11/2018, 3:48 PM  LOS: 2 days

## 2018-04-11 NOTE — Progress Notes (Signed)
Temp 102.1 & pulse 115.  Tylenol suppository given per prn orders.  Dr. Erlinda Hong notified. Will continue to monitor.

## 2018-04-11 NOTE — Transfer of Care (Signed)
Immediate Anesthesia Transfer of Care Note  Patient: [name]  Procedure(s) Performed: IRRIGATION AND DEBRIDEMENT WOUND (Right Foot)  Patient Location: PACU  Anesthesia Type:MAC  Level of Consciousness: awake, alert , oriented and patient cooperative  Airway & Oxygen Therapy: Patient Spontanous Breathing and Patient connected to face mask oxygen  Post-op Assessment: Report given to RN, Post -op Vital signs reviewed and stable and Patient moving all extremities  Post vital signs: Reviewed and stable  Last Vitals:  Vitals Value Taken Time  BP 112/65 04/11/2018  7:45 PM  Temp    Pulse 96 04/11/2018  7:48 PM  Resp 15 04/11/2018  7:48 PM  SpO2 97 % 04/11/2018  7:48 PM  Vitals shown include unvalidated device data.  Last Pain:  Vitals:   04/11/18 1714  TempSrc: Oral  PainSc:          Complications: No apparent anesthesia complications

## 2018-04-11 NOTE — Progress Notes (Signed)
CRITICAL VALUE ALERT  Critical Value:  Surgical pcr positive for MRSA (nasal swab)  Date & Time Notied:04/11/18  1730  Provider Notified: 04/11/18  1745  Orders Received/Actions taken: no new orders. Nasal betadine swab per protocol

## 2018-04-11 NOTE — Op Note (Signed)
Patient Name: [name] DOB: [birth year]  MRN: [id number]  Date of Service: 04/11/18   Surgeon: Dr. Hardie Pulley, DPM Assistants: None Pre-operative Diagnosis: R Diabetic Foot Infection; Ulcer R Foot Post-operative Diagnosis: Same Procedures:             1) Debridement and Irrigation of Skin, Soft Tissue, Fascia, and Bone - total area debrided 27 cm2  2) Incision and Drainage R Ankle Pathology/Specimens:             1) Swab culture R Foot -micro  2) R Foot Cuboid Bone - micro  3) R Foot Cuboid Bone - pathology  4) R Foot Deep Soft Tissue - micro Anesthesia: MAC with Local - 20 ccs 0.5% marcaine plain. Hemostasis: Anatomic Estimated Blood Loss: 20 Materials: None Medications: 0.5 g Vancomycin powder applied topically. Complications: None  Indications for Procedure: This is a 52 year old female with a chief complaint of a long-standing right foot ulceration.  After going on vacation last week patient was feeling sick and had bouts of confusion.  Husband states that she has been disoriented since last Saturday.  Reports worsening right foot infection.  It was discussed the patient and her husband that she would benefit from incision and drainage of the right foot abscess with debridement of all nonviable soft tissue.  Patient has been agreed to proceed  Procedure in Detail: Patient was identified in pre-operative holding area. Formal consent was signed and the right lower extremity was marked. Patient was brought back to the operating room and remained on her stretcher.  Timeout was taken to identify the correct patient and procedure.  The right lower extremity was then prepped and draped in usual sterile fashion.  Attention was directed to the right plantar foot where there was a large bulla with a 1 cm x 1 cm superficial wound with draining purulence.  A 15 blade was used to incise the bulla with return of purulent material.  All redundant soft tissue was sharply excised with a 15  blade.  The deeper tissues appeared necrotic and were debrided with a rongeur.  There was noted to be exposed bone in the wound bed.  The wound was explored and there was noted be a large tract along the flexor tendon sheath with purulence.  The area was expressed of all purulence starting proximally and working distally along the flexor tendons. A large hemostat was gently passed from the plantar wound to the flexor tendon sheath without resistance from the soft tissue.  Additional stab incision was made at the endpoint of the hemostat at the ankle and the proximal sheath was explored. There was no continued proximal purulence. There was an additional pocket of purulence distal about the flexor tendons of the toes.  This was similarly explored with a hemostat and purulence was expressed.  After maximal expression of purulence, all nonviable soft tissue was debrided with a rongeur.  The area was then copiously irrigated with 3 L normal saline. Post irrigation, a clean rongeur was used to debride the exposed cuboid bone and collect a sample for culture and for pathology.  An additional soft tissue specimen was collected for culture. The wound was measured to be 6x4.5x1.5 post-debridement. The wound was then packed with iodoform packing and dressed with 4 x 4, Kerlix ABD and Ace bandage.  Patient tolerated the procedure well.  Disposition: Following a period of post-operative monitoring, patient will be transferred back to the floor.  We will follow-up wound cultures for antibiotic recommendations.  Will likely benefit from extended IV antibiotics for likely bone infection. She would likely benefit from return to the operating room for repeat debridement.

## 2018-04-12 ENCOUNTER — Inpatient Hospital Stay (HOSPITAL_COMMUNITY): Payer: Medicare Other

## 2018-04-12 ENCOUNTER — Inpatient Hospital Stay (HOSPITAL_COMMUNITY): Payer: Self-pay

## 2018-04-12 ENCOUNTER — Encounter (HOSPITAL_COMMUNITY): Payer: Self-pay | Admitting: Podiatry

## 2018-04-12 ENCOUNTER — Other Ambulatory Visit (INDEPENDENT_AMBULATORY_CARE_PROVIDER_SITE_OTHER): Payer: Self-pay | Admitting: Orthopedic Surgery

## 2018-04-12 ENCOUNTER — Telehealth: Payer: Self-pay | Admitting: Sports Medicine

## 2018-04-12 DIAGNOSIS — L02419 Cutaneous abscess of limb, unspecified: Secondary | ICD-10-CM

## 2018-04-12 DIAGNOSIS — E876 Hypokalemia: Secondary | ICD-10-CM

## 2018-04-12 DIAGNOSIS — L02611 Cutaneous abscess of right foot: Secondary | ICD-10-CM

## 2018-04-12 LAB — CBC
HEMATOCRIT: 30.8 % — AB (ref 36.0–46.0)
HEMOGLOBIN: 10.1 g/dL — AB (ref 12.0–15.0)
MCH: 31.5 pg (ref 26.0–34.0)
MCHC: 32.8 g/dL (ref 30.0–36.0)
MCV: 96 fL (ref 78.0–100.0)
Platelets: 232 10*3/uL (ref 150–400)
RBC: 3.21 MIL/uL — AB (ref 3.87–5.11)
RDW: 14.5 % (ref 11.5–15.5)
WBC: 12.6 10*3/uL — ABNORMAL HIGH (ref 4.0–10.5)

## 2018-04-12 LAB — LIPID PANEL
Cholesterol: 76 mg/dL (ref 0–200)
HDL: 15 mg/dL — ABNORMAL LOW (ref >40)
LDL Cholesterol: 37 mg/dL (ref 0–99)
TRIGLYCERIDES: 122 mg/dL (ref <150)
Total CHOL/HDL Ratio: 5.1 RATIO
VLDL: 24 mg/dL (ref 0–40)

## 2018-04-12 LAB — CULTURE, BLOOD (ROUTINE X 2): Special Requests: ADEQUATE

## 2018-04-12 LAB — BASIC METABOLIC PANEL
ANION GAP: 9 (ref 5–15)
BUN: 6 mg/dL (ref 6–20)
CHLORIDE: 106 mmol/L (ref 101–111)
CO2: 23 mmol/L (ref 22–32)
Calcium: 8.2 mg/dL — ABNORMAL LOW (ref 8.9–10.3)
Creatinine, Ser: 0.58 mg/dL (ref 0.44–1.00)
GFR calc non Af Amer: >60 mL/min (ref >60)
Glucose, Bld: 161 mg/dL — ABNORMAL HIGH (ref 65–99)
POTASSIUM: 3.2 mmol/L — AB (ref 3.5–5.1)
Sodium: 138 mmol/L (ref 135–145)

## 2018-04-12 LAB — GLUCOSE, CAPILLARY
Glucose-Capillary: 166 mg/dL — ABNORMAL HIGH (ref 65–99)
Glucose-Capillary: 132 mg/dL — ABNORMAL HIGH (ref 65–99)
Glucose-Capillary: 187 mg/dL — ABNORMAL HIGH (ref 65–99)
Glucose-Capillary: 186 mg/dL — ABNORMAL HIGH (ref 65–99)
Glucose-Capillary: 161 mg/dL — ABNORMAL HIGH (ref 65–99)

## 2018-04-12 LAB — MAGNESIUM: Magnesium: 1.7 mg/dL (ref 1.7–2.4)

## 2018-04-12 MED ORDER — SODIUM CHLORIDE 0.9 % IV SOLN
2.0000 g | INTRAVENOUS | Status: DC
  Administered 2018-04-12 – 2018-04-16 (×24): 2 g via INTRAVENOUS
  Filled 2018-04-12 (×10): qty 2000
  Filled 2018-04-12: qty 2
  Filled 2018-04-12 (×3): qty 2000
  Filled 2018-04-12 (×2): qty 2
  Filled 2018-04-12: qty 2000
  Filled 2018-04-12: qty 2
  Filled 2018-04-12 (×6): qty 2000
  Filled 2018-04-12: qty 2
  Filled 2018-04-12 (×2): qty 2000
  Filled 2018-04-12 (×2): qty 2
  Filled 2018-04-12: qty 2000

## 2018-04-12 MED ORDER — VANCOMYCIN HCL 10 G IV SOLR
1500.0000 mg | Freq: Every day | INTRAVENOUS | Status: DC
  Administered 2018-04-12: 1500 mg via INTRAVENOUS
  Filled 2018-04-12: qty 1500

## 2018-04-12 MED ORDER — MAGNESIUM SULFATE IN D5W 1-5 GM/100ML-% IV SOLN
1.0000 g | Freq: Once | INTRAVENOUS | Status: AC
  Administered 2018-04-12: 1 g via INTRAVENOUS
  Filled 2018-04-12: qty 100

## 2018-04-12 MED ORDER — ENOXAPARIN SODIUM 40 MG/0.4ML ~~LOC~~ SOLN
40.0000 mg | SUBCUTANEOUS | Status: DC
  Administered 2018-04-14 – 2018-04-16 (×3): 40 mg via SUBCUTANEOUS
  Filled 2018-04-12 (×3): qty 0.4

## 2018-04-12 MED ORDER — MUPIROCIN 2 % EX OINT
1.0000 "application " | TOPICAL_OINTMENT | Freq: Two times a day (BID) | CUTANEOUS | Status: DC
  Administered 2018-04-12 – 2018-04-16 (×8): 1 via NASAL
  Filled 2018-04-12 (×5): qty 22

## 2018-04-12 MED ORDER — POTASSIUM CHLORIDE CRYS ER 10 MEQ PO TBCR
40.0000 meq | EXTENDED_RELEASE_TABLET | Freq: Once | ORAL | Status: AC
Start: 2018-04-12 — End: 2018-04-12
  Administered 2018-04-12: 40 meq via ORAL
  Filled 2018-04-12: qty 4

## 2018-04-12 MED ORDER — CHLORHEXIDINE GLUCONATE CLOTH 2 % EX PADS
6.0000 | MEDICATED_PAD | Freq: Every day | CUTANEOUS | Status: DC
  Administered 2018-04-13 – 2018-04-16 (×4): 6 via TOPICAL

## 2018-04-12 NOTE — Progress Notes (Signed)
PHARMACY CONSULT NOTE FOR:  OUTPATIENT  PARENTERAL ANTIBIOTIC THERAPY (OPAT)  Indication: Group B strep Regimen: Ampicillin 2gm IV q6h (use q6h at discharge to easier administration vs q4h)   - alternatively could consider continuous infusion if home health agency prefers End date: May 10, 2018  IV antibiotic discharge orders are pended. To discharging provider:  please sign these orders via discharge navigator,  Select New Orders & click on the button choice - Manage This Unsigned Work.     Thank you for allowing pharmacy to be a part of this patient's care.  Doreene Eland, PharmD, BCPS.   04/12/2018 3:53 PM

## 2018-04-12 NOTE — Care Management Note (Signed)
Case Management Note  Patient Details  Name: [name] MRN: [id number] Date of Birth: 03-18-1966  Subjective/Objective:                    Action/Plan: Pt plan to discharge home with [facility]. Pt did not want [facility] or [facility] for [facility]. [facility] was selected.    Expected Discharge Date:  (unknown)               Expected Discharge Plan:  [place]  In-House Referral:     Discharge planning Services  CM Consult  Post Acute Care Choice:    Choice offered to:  Patient  DME Arranged:    DME Agency:     HH Arranged:  RN, PT, OT, Nurse's Aide [place] Agency:  [facility]  Status of Service:  Completed, signed off  If discussed at [place] of Stay Meetings, dates discussed:    Additional CommentsPurcell Mouton, RN 04/12/2018, 11:59 AM

## 2018-04-12 NOTE — Care Management Important Message (Signed)
Important Message  Patient Details  Name: [name] MRN: [id number] Date of Birth: December 11, 1966   Medicare Important Message Given:  Yes    Kerin Salen 04/12/2018, 11:50 AMImportant Message  Patient Details  Name: [name] MRN: [id number] Date of Birth: 1966-05-28   Medicare Important Message Given:  Yes    Kerin Salen 04/12/2018, 11:50 AM

## 2018-04-12 NOTE — Progress Notes (Signed)
PT Cancellation Note  Patient Details Name: BRYLI MANTEY MRN: [id number] DOB: [birth year]   Cancelled Treatment:     Pt plans to have another surgery tomorrow so will hold off till after.  Pt has been evaluated and was only able to sit briefly sit  EOB.     Rica Koyanagi  PTA [facility]  Acute  Rehab Pager      [phone]

## 2018-04-12 NOTE — Progress Notes (Signed)
Bilateral ABIs and TBIs completed. Right ABI 1.03 with triphasic Doppler waveforms indicating normal arterial flow. The TBI is 0.65 indicating abnormal arterial flow. Left - ABI 1.19 with triphasic Doppler waveforms indicating normal arterial flow. The TBI is 0.84 indicating normal flow. [facility], [place]  04/12/2018 9:04 AM

## 2018-04-12 NOTE — Progress Notes (Signed)
Pharmacy Antibiotic Note  [name] is a 52 y.o. female admitted on 04/09/2018 with Group B strep bacteremia from bilateral foot infection.  Pharmacy has been consulted for Ampicillin dosing.  Plan: Narrow abx to Ampicillin 2gm q4hr  Vanc/Zosyn begun 4/15, changed to Rocephin/Flagyl on 4/17, with Vancomycin added back 4/17 pm for temp spike.  ID MD contacted-recommendation to narrow to Ampicillin with known GB strep bacteremia and wound cx with abundant GPC  Height: 5\' 5"  (165.1 cm) Weight: 211 lb 10.3 oz (96 kg) IBW/kg (Calculated) : 57  Temp (24hrs), Avg:100.6 F (38.1 C), Min:98.2 F (36.8 C), Max:103 F (39.4 C)  Recent Labs  Lab 04/09/18 1828 04/09/18 1843 04/09/18 2057 04/09/18 2333 04/10/18 0251 04/10/18 0542 04/11/18 0553 04/12/18 0553  WBC 30.3*  --   --   --   --  22.8*  --  12.6*  CREATININE 1.05*  --   --   --   --  0.84 0.64 0.58  LATICACIDVEN  --  3.40* 1.77 1.2 0.8  --   --   --     Estimated Creatinine Clearance: 94.3 mL/min (by C-G formula based on SCr of 0.58 mg/dL).    Allergies  Allergen Reactions  . Latex Itching, Rash and Other (See Comments)    Pt states she cannot use condoms - cause an infection.  Use of latex on skin is okay.  Tape causes rash  . Sweetening Enhancer [Flavoring Agent] Nausea And Vomiting and Other (See Comments)    HEADACHES  . Aspartame And Phenylalanine Nausea And Vomiting    HEADACHES  . Ibuprofen Other (See Comments)    HEADACHES  . Trazodone And Nefazodone Other (See Comments)    Hallucinations   . Triazolam Other (See Comments)    HALLUCINATIONS   Antimicrobials this admission: 4/15 vanc >> 4/17  4/18 vanc >> 4/18 4/15 zosyn >> 4/17 4/17 CTX >> 4/18 4/17 Flagyl >> 4/18 4/18 Ampicillin IV >>  Dose adjustments this admission:  Microbiology results: 4/15 BCx: 1/4 bottles GPC (BCID = Grp B strep) 4/15 UCx: mult sp-final 4/17 Surg PCR: MRSA PCR + 4/17 R foot wound: abundant GPC 4/17 R foot fungal stain:  sent 4/17 R foot Bone: sent 4/17 R foot Bone fungal stain: sent 4/18 BCx: sent Thank you for allowing pharmacy to be a part of this patient's care.  Minda Ditto PharmD Pager [phone] 04/12/2018, 1:04 PM

## 2018-04-12 NOTE — Telephone Encounter (Signed)
Per Dr. March Rummage, called [facility] and cancelled pt's surgery. She currently has an infection and there is a chance Dr. Sharol Given may perform and amputation. Also marked in the surgery book that her surgery has been cancelled and I also cancelled her post op visits.

## 2018-04-12 NOTE — Progress Notes (Addendum)
PROGRESS NOTE  [name] YYQ:[id number] DOB: 01-Jul-1966 DOA: 04/09/2018 PCP: Tamsen Roers, MD  HPI/Recap of past 24 hours:  S/p debridement yesterday evening Last fever of 101.4 around midnight ,  She is put on three liter oxygen overnight due to nocturnal hypoxia during sleep. She received pain meds, she  is drowsy this am  Husband at bedside  Assessment/Plan: Active Problems:   Sepsis due to cellulitis ([place])   Major depressive disorder, recurrent episode ([place])   Diabetic ulcer of right midfoot associated with type 2 diabetes mellitus, with necrosis of bone ([facility])   Abscess of ankle   Infectious synovitis   Diabetic foot infection ([place])  Sepsis secondary to diabetic foot infection, with early osteomyelitis of the right foot/ streptococcal bacteremia -she presented with metabolic encephalopathy, uds negative, CT head negative for acute findings -ABI unremarkable -she was started on vanc/zosyn on admission, abx change to rocephin /flagyl to cover strep and anaerobes. -podiatrist Dr March Rummage consulted, patient is brought to OR for I/D and debridement and right foot cuboid bone biopsy on 4/17,  -Repeat blood culture, get echocardiogram - I have discussed the case with infectious disease Dr Baxter Flattery who recommended iv ampicilin  for 4weeks then oral amoxicillin for another two weeks - will need to place picc line once repeat blood culture negative for bacteremia.   4/18 5pm addendum:  Podiatrist Dr March Rummage called and  recommend ortho consult for right BKA I have discussed case with Dr Sharol Given who wants patient to transfer to [facility], keep npo after midnight for surgery on 4/19 am,  (Patient may only need Total of two weeks abx for bacteremia if she has right BKA. 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 I donot see metformin on her meds list either  Hypokalemia/hypomagnesemia: replace k/mag  Peripheral oneuropathy: Continue neurontin  CKDII Renal function stable at baseline Renal dosing meds  COPD/current everyday smoker No wheezing today, no hypixa cxr on presentation no acute findings Smoking cessation education >42mins provided, nicotine patch   H/o post cath CVA per patient H/o nonischemic cardiomyopathy  Depression/anxiety/sleep disorder/poly pharmacy No si/hi Psychiatry input appreciated Per psych: Avoid valium, start celexa and hydroxyzine There are mulitple psych meds listed on her home meds list, will need to discuss with her once she is more alert from the surgery and prn pain meds  MRSA colonization: Decolonization protocol  Body mass index is 35.22 kg/m. Has nocturnal hypoxia, on o2 supplement now, may need outpatient sleep study  Code Status: full  Family Communication: patient and husband at bedside  Disposition Plan: transfer from [facility] to [facility] for right BKA   Consultants:  Podiatry Dr March Rummage  Case discussed with infectious disease Dr Baxter Flattery over the phone on 4/18 am  Case discussed with ortho Dr Sharol Given over the phone on 4/18 pm  Procedures:  Right foot I/D and bone /tissue biopsy on 4/17  Antibiotics:  Vanc/zosyn on admission  Rocephin/flagyl from 4/17 to 4/18  Ampicillin from 4/18   Objective: BP (!) 108/55 (BP Location: Right Arm)   Pulse 80   Temp 99.5 F (37.5 C) (Axillary)   Resp 20   Ht 5\' 5"  (1.651 m)   Wt 96 kg (  211 lb 10.3 oz)   SpO2 94%   BMI 35.22 kg/m   Intake/Output Summary (Last 24 hours) at 04/12/2018 0807 Last data filed at 04/12/2018 0754 Gross per 24 hour  Intake 2193.33 ml  Output 1220 ml  Net 973.33 ml   Filed Weights   04/09/18 2251 04/11/18 1700  Weight: 96.2 kg  (212 lb 1.3 oz) 96 kg (211 lb 10.3 oz)    Exam: Patient is examined daily including today on 04/12/2018, exams remain the same as of yesterday except that has changed    General:  NAD, drowsy but oriented x3  Cardiovascular: RRR  Respiratory: CTABL  Abdomen: Soft/ND/NT, positive BS  Musculoskeletal: right foot post op changes, dressing in place. She has decreased sensation bilateral foot, palpable pedal pulse. Left foot ulcer over first MTP joint. No drainage, does not appear infected.  ( please see pic obtained on 4/17 prior to I/d)  Neuro: oriented     Data Reviewed: Basic Metabolic Panel: Recent Labs  Lab 04/09/18 1828 04/10/18 0542 04/11/18 0553 04/12/18 0553  NA 131* 132*  --  138  K 4.6 3.8  --  3.2*  CL 90* 97*  --  106  CO2 25 25  --  23  GLUCOSE 385* 166*  --  161*  BUN 15 14  --  6  CREATININE 1.05* 0.84 0.64 0.58  CALCIUM 9.5 8.1*  --  8.2*  MG  --   --   --  1.7   Liver Function Tests: Recent Labs  Lab 04/09/18 1828 04/10/18 0542  AST 16 10*  ALT 16 11*  ALKPHOS 110 87  BILITOT 0.3 0.5  PROT 7.6 6.0*  ALBUMIN 3.3* 2.4*   No results for input(s): LIPASE, AMYLASE in the last 168 hours. No results for input(s): AMMONIA in the last 168 hours. CBC: Recent Labs  Lab 04/09/18 1828 04/10/18 0542 04/12/18 0553  WBC 30.3* 22.8* 12.6*  NEUTROABS 28.2*  --   --   HGB 13.0 10.5* 10.1*  HCT 39.1 32.0* 30.8*  MCV 95.1 96.1 96.0  PLT 261 221 232   Cardiac Enzymes:   No results for input(s): CKTOTAL, CKMB, CKMBINDEX, TROPONINI in the last 168 hours. BNP (last 3 results) No results for input(s): BNP in the last 8760 hours.  ProBNP (last 3 results) No results for input(s): PROBNP in the last 8760 hours.  CBG: Recent Labs  Lab 04/11/18 1605 04/11/18 1953 04/11/18 2322 04/12/18 0347 04/12/18 0732  GLUCAP 97 91 192* 166* 132*    Recent Results (from the past 240 hour(s))  Blood Culture (routine x 2)     Status: Abnormal (Preliminary result)     Collection Time: 04/09/18  6:28 PM  Result Value Ref Range Status   Specimen Description   Final    BLOOD BLOOD LEFT FOREARM Performed at [facility] [address], [place], [place] [address]    Special Requests   Final    BOTTLES DRAWN AEROBIC AND ANAEROBIC Blood Culture adequate volume Performed at [facility] [address] [place], [place] [address]    Culture  Setup Time   Final    GRAM POSITIVE COCCI IN PAIRS AND CHAINS ANAEROBIC BOTTLE ONLY CRITICAL RESULT CALLED TO, READ BACK BY AND VERIFIED WITH: Marcy Siren PharmD 11:45 04/10/18 ([email or web address])    Culture (A)  Final    GROUP B STREP(S.AGALACTIAE)ISOLATED SUSCEPTIBILITIES TO FOLLOW Performed at [facility], [place] [address], [place],  [address]  Report Status PENDING  Incomplete  Urine culture     Status: Abnormal   Collection Time: 04/09/18  6:28 PM  Result Value Ref Range Status   Specimen Description   Final    URINE, RANDOM Performed at [facility] [address] [place], [place] [address]    Special Requests   Final    URINE, RANDOM Performed at [facility] [address] [place], [place] [address]    Culture MULTIPLE SPECIES PRESENT, SUGGEST RECOLLECTION (A)  Final   Report Status 04/11/2018 FINAL  Final  Blood Culture ID Panel (Reflexed)     Status: Abnormal   Collection Time: 04/09/18  6:28 PM  Result Value Ref Range Status   Enterococcus species NOT DETECTED NOT DETECTED Final   Listeria monocytogenes NOT DETECTED NOT DETECTED Final   Staphylococcus species NOT DETECTED NOT DETECTED Final   Staphylococcus aureus NOT DETECTED NOT DETECTED Final   Streptococcus species DETECTED (A) NOT DETECTED Final    Comment: CRITICAL RESULT CALLED TO, READ BACK BY AND VERIFIED WITH: Marcy Siren PharmD 11:45 04/10/18 ([email or web address])    Streptococcus agalactiae DETECTED (A) NOT DETECTED Final    Comment: CRITICAL RESULT CALLED TO,  READ BACK BY AND VERIFIED WITH: Marcy Siren PharmD 11:45 04/10/18 ([email or web address])    Streptococcus pneumoniae NOT DETECTED NOT DETECTED Final   Streptococcus pyogenes NOT DETECTED NOT DETECTED Final   Acinetobacter baumannii NOT DETECTED NOT DETECTED Final   Enterobacteriaceae species NOT DETECTED NOT DETECTED Final   Enterobacter cloacae complex NOT DETECTED NOT DETECTED Final   Escherichia coli NOT DETECTED NOT DETECTED Final   Klebsiella oxytoca NOT DETECTED NOT DETECTED Final   Klebsiella pneumoniae NOT DETECTED NOT DETECTED Final   Proteus species NOT DETECTED NOT DETECTED Final   Serratia marcescens NOT DETECTED NOT DETECTED Final   Haemophilus influenzae NOT DETECTED NOT DETECTED Final   Neisseria meningitidis NOT DETECTED NOT DETECTED Final   Pseudomonas aeruginosa NOT DETECTED NOT DETECTED Final   Candida albicans NOT DETECTED NOT DETECTED Final   Candida glabrata NOT DETECTED NOT DETECTED Final   Candida krusei NOT DETECTED NOT DETECTED Final   Candida parapsilosis NOT DETECTED NOT DETECTED Final   Candida tropicalis NOT DETECTED NOT DETECTED Final    Comment: Performed at [facility], [place]. [address] [place], [place] [address]  Blood Culture (routine x 2)     Status: None (Preliminary result)   Collection Time: 04/09/18 11:33 PM  Result Value Ref Range Status   Specimen Description   Final    BLOOD RIGHT ANTECUBITAL Performed at [facility] [address], [place], [place] [address]    Special Requests   Final    BOTTLES DRAWN AEROBIC AND ANAEROBIC Blood Culture adequate volume Performed at [facility] [address], [place], [place] [address]    Culture   Final    NO GROWTH 1 DAY Performed at [facility], [place] [address], [place], [place] [address]    Report Status PENDING  Incomplete  Surgical pcr screen     Status: Abnormal   Collection Time: 04/11/18  2:25 PM  Result Value Ref Range Status   MRSA, PCR POSITIVE (A)  NEGATIVE Final    Comment: RESULT CALLED TO, READ BACK BY AND VERIFIED WITH: PAM MENTLEY,RN [id number] @ 1731 BY J SCOTTON    Staphylococcus aureus POSITIVE (A) NEGATIVE Final    Comment: (NOTE) The Xpert SA Assay (FDA approved for NASAL specimens  in patients 10 years of age and older), is one component of a comprehensive surveillance program. It is not intended to diagnose infection nor to guide or monitor treatment. Performed at [facility], [place] [address] [place], [place] [address]   Aerobic/Anaerobic Culture (surgical/deep wound)     Status: None (Preliminary result)   Collection Time: 04/11/18  7:09 PM  Result Value Ref Range Status   Specimen Description   Final    WOUND FOOT RIGHT Performed at [facility] [address], [place], [place] [address]    Special Requests   Final    NONE Performed at [facility], [place] [address] [place], [place] [address]    Gram Stain   Final    MODERATE WBC PRESENT, PREDOMINANTLY PMN ABUNDANT GRAM POSITIVE COCCI Performed at [facility], [place] [address] [place], [place] [address]    Culture PENDING  Incomplete   Report Status PENDING  Incomplete  Aerobic/Anaerobic Culture (surgical/deep wound)     Status: None (Preliminary result)   Collection Time: 04/11/18  7:38 PM  Result Value Ref Range Status   Specimen Description   Final    BONE FOOT RIGHT Performed at [facility] [address], [place], [place] [address]    Special Requests   Final    NONE Performed at [facility], [place] [address], [place], [place] [address]    Gram Stain   Final    RARE WBC PRESENT, PREDOMINANTLY PMN FEW GRAM POSITIVE COCCI Performed at [facility], [place] [address], [place], [place] [address]    Culture PENDING  Incomplete   Report Status PENDING  Incomplete  Aerobic/Anaerobic Culture (surgical/deep wound)     Status: None (Preliminary result)     Collection Time: 04/11/18  7:41 PM  Result Value Ref Range Status   Specimen Description   Final    WOUND FOOT RIGHT Performed at [facility] [address] [place], [place] [address]    Special Requests   Final    NONE Performed at [facility], [place] [address], [place], [place] [address]    Gram Stain   Final    RARE WBC PRESENT, PREDOMINANTLY PMN NO ORGANISMS SEEN Performed at [facility], [place] [address] [place],  [address]    Culture PENDING  Incomplete   Report Status PENDING  Incomplete     Studies: No results found.  Scheduled Meds: . enoxaparin (LOVENOX) injection  40 mg Subcutaneous Q24H  . gabapentin  600 mg Oral BID  . insulin aspart  0-15 Units Subcutaneous Q4H  . insulin glargine  36 Units Subcutaneous QAC breakfast  . nicotine  21 mg Transdermal Daily  . potassium chloride  40 mEq Oral Once  . QUEtiapine  600 mg Oral QHS  . rOPINIRole  2 mg Oral QHS  . sodium chloride flush  3 mL Intravenous Q12H    Continuous Infusions: . sodium chloride 100 mL/hr at 04/11/18 2153  . cefTRIAXone (ROCEPHIN)  IV Stopped (04/11/18 1545)  . magnesium sulfate 1 - 4 g bolus IVPB    . metronidazole Stopped (04/12/18 0056)  . vancomycin Stopped (04/12/18 0402)     Time spent: 35 mins, case discussed with podiatrist Dr March Rummage and infectious disease Dr Baxter Flattery and orthopedics Dr Sharol Given I have personally reviewed and interpreted on  04/12/2018 daily labs, tele strips, imagings as discussed above under date review session and  assessment and plans.  I reviewed all nursing notes, pharmacy notes, consultant notes,  vitals, pertinent old records  I have discussed plan of care as described above with RN , patient and husband on 04/12/2018   Florencia Reasons MD, PhD  [facility] Pager [phone]. If 7PM-7AM, please contact night-coverage at [email or web address], password [facility] 04/12/2018, 8:07 AM  LOS: 3 days

## 2018-04-12 NOTE — Progress Notes (Signed)
Pt was active with [facility] before coming in the hospital. Pt will continue with them [place].

## 2018-04-12 NOTE — Progress Notes (Signed)
Subjective:  Patient ID: [name], female    DOB: [birth year],  MRN: [id number]  Seen at bedside. No overnight events. Denies N/V/F/Ch. Complains of pain in her R foot.  Objective:   Vitals:   04/12/18 0754 04/12/18 1409  BP:  110/68  Pulse:  65  Resp:  20  Temp: 99.5 F (37.5 C) 98.4 F (36.9 C)  SpO2:  98%   General AA&O x3. Normal mood and affect.  Vascular Foot warm to touch. Pedal pulse palpable.  Neurologic Epicritic sensation grossly diminished.  Dermatologic Wound R Foot with continued slight purulence. Wound bed with necrosis and fibrotic base. Erythema appears decreased at the leg but skin still edematous and taut.   Orthopedic: + Motor to toes.   Results for orders placed or performed during the hospital encounter of 04/09/18 (from the past 24 hour(s))  Aerobic/Anaerobic Culture (surgical/deep wound)     Status: None (Preliminary result)   Collection Time: 04/11/18  7:09 PM  Result Value Ref Range   Specimen Description      WOUND FOOT RIGHT Performed at [facility] [address], [place], [place] [address]    Special Requests      NONE Performed at [facility], [place] [address] [place], [place] [address]    Gram Stain      MODERATE WBC PRESENT, PREDOMINANTLY PMN ABUNDANT GRAM POSITIVE COCCI    Culture      CULTURE REINCUBATED FOR BETTER GROWTH Performed at [facility], [place] [address] [place], [place] [address]    Report Status PENDING   Aerobic/Anaerobic Culture (surgical/deep wound)     Status: None (Preliminary result)   Collection Time: 04/11/18  7:38 PM  Result Value Ref Range   Specimen Description      BONE FOOT RIGHT Performed at [facility] [address], [place], [place] [address]    Special Requests      NONE Performed at [facility], [place] [address] [place], [place] [address]    Gram Stain      RARE WBC PRESENT, PREDOMINANTLY PMN FEW GRAM  POSITIVE COCCI    Culture      CULTURE REINCUBATED FOR BETTER GROWTH Performed at [facility], [place] [address] [place], [place] [address]    Report Status PENDING   Aerobic/Anaerobic Culture (surgical/deep wound)     Status: None (Preliminary result)   Collection Time: 04/11/18  7:41 PM  Result Value Ref Range   Specimen Description      WOUND FOOT RIGHT Performed at [facility] [address], [place], [place] [address]    Special Requests      NONE Performed at [facility], [place] [address] [place], [place] [address]    Gram Stain      RARE WBC PRESENT, PREDOMINANTLY PMN NO ORGANISMS SEEN    Culture      CULTURE REINCUBATED FOR BETTER GROWTH Performed at [facility], [place] [address] [place], [place] [address]    Report Status PENDING   Glucose, capillary     Status: None   Collection Time: 04/11/18  7:53 PM  Result Value Ref Range   Glucose-Capillary 91 65 - 99 mg/dL  Glucose, capillary     Status: Abnormal   Collection Time: 04/11/18 11:22 PM  Result Value Ref Range   Glucose-Capillary 192 (H) 65 - 99 mg/dL  Glucose, capillary     Status: Abnormal  Collection Time: 04/12/18  3:47 AM  Result Value Ref Range   Glucose-Capillary 166 (H) 65 - 99 mg/dL  CBC     Status: Abnormal   Collection Time: 04/12/18  5:53 AM  Result Value Ref Range   WBC 12.6 (H) 4.0 - 10.5 K/uL   RBC 3.21 (L) 3.87 - 5.11 MIL/uL   Hemoglobin 10.1 (L) 12.0 - 15.0 g/dL   HCT 30.8 (L) 36.0 - 46.0 %   MCV 96.0 78.0 - 100.0 fL   MCH 31.5 26.0 - 34.0 pg   MCHC 32.8 30.0 - 36.0 g/dL   RDW 14.5 11.5 - 15.5 %   Platelets 232 150 - 400 K/uL  Basic metabolic panel     Status: Abnormal   Collection Time: 04/12/18  5:53 AM  Result Value Ref Range   Sodium 138 135 - 145 mmol/L   Potassium 3.2 (L) 3.5 - 5.1 mmol/L   Chloride 106 101 - 111 mmol/L   CO2 23 22 - 32 mmol/L   Glucose, Bld 161 (H) 65 - 99 mg/dL   BUN 6 6 - 20 mg/dL   Creatinine, Ser  0.58 0.44 - 1.00 mg/dL   Calcium 8.2 (L) 8.9 - 10.3 mg/dL   GFR calc non Af Amer >60 >60 mL/min   GFR calc Af Amer >60 >60 mL/min   Anion gap 9 5 - 15  Magnesium     Status: None   Collection Time: 04/12/18  5:53 AM  Result Value Ref Range   Magnesium 1.7 1.7 - 2.4 mg/dL  Lipid panel     Status: Abnormal   Collection Time: 04/12/18  5:53 AM  Result Value Ref Range   Cholesterol 76 0 - 200 mg/dL   Triglycerides 122 <150 mg/dL   HDL 15 (L) >40 mg/dL   Total CHOL/HDL Ratio 5.1 RATIO   VLDL 24 0 - 40 mg/dL   LDL Cholesterol 37 0 - 99 mg/dL  Glucose, capillary     Status: Abnormal   Collection Time: 04/12/18  7:32 AM  Result Value Ref Range   Glucose-Capillary 132 (H) 65 - 99 mg/dL  Glucose, capillary     Status: Abnormal   Collection Time: 04/12/18 12:46 PM  Result Value Ref Range   Glucose-Capillary 187 (H) 65 - 99 mg/dL  Glucose, capillary     Status: Abnormal   Collection Time: 04/12/18  4:42 PM  Result Value Ref Range   Glucose-Capillary 186 (H) 65 - 99 mg/dL   Vitals:   04/12/18 0754 04/12/18 1409  BP:  110/68  Pulse:  65  Resp:  20  Temp: 99.5 F (37.5 C) 98.4 F (36.9 C)  SpO2:  98%    Assessment & Plan:  Patient was evaluated and treated and all questions answered.  R Diabetic Foot Infection S/p I&D -Labs reviewed. WBC downtrending. Vitals stable. -Cultures reviewed. Bone with +GS concerning for osteomyelitis. -Discussed with husband long term outcome and expectations. Discussed continued salvage will require aggressive wound care, long-term Abx for osteomyelitis, and extended NWB. Discussed long road to recovery with best hope being a healed ulcer able to be successfully protected with a CROW walker. Alternatively discussed benefits of below knee amputation on quality of life. Given severity of Charcot with joint dislocation, bone infection, and her ulceration, discussed that she would most benefit from possible below knee amputation. Husband verbalized  understanding and said he would discuss with his wife. -Recommend consult to Dr. Sharol Given for evaluation for possible BKA.  Podiatry to  sign off.   Evelina Bucy, DPM

## 2018-04-12 NOTE — Progress Notes (Signed)
Pharmacy Antibiotic Note  [name] is a 52 y.o. female admitted on 04/09/2018 with wound infection.  Pharmacy has been consulted for vancomycin dosing.  Plan: Vancomycin 1500 mg IV q24h for est AUC = 483 Goal AUC = 400-500 Was on Vancomycin previously LD 4/16 2000 now with T=102.9 Rocephin 2 Gm IV q24h (MD) Flagyl 500 mg IV q8h (MD)   Height: 5\' 5"  (165.1 cm) Weight: 211 lb 10.3 oz (96 kg) IBW/kg (Calculated) : 57  Temp (24hrs), Avg:100.8 F (38.2 C), Min:98.2 F (36.8 C), Max:103 F (39.4 C)  Recent Labs  Lab 04/09/18 1828 04/09/18 1843 04/09/18 2057 04/09/18 2333 04/10/18 0251 04/10/18 0542 04/11/18 0553  WBC 30.3*  --   --   --   --  22.8*  --   CREATININE 1.05*  --   --   --   --  0.84 0.64  LATICACIDVEN  --  3.40* 1.77 1.2 0.8  --   --     Estimated Creatinine Clearance: 94.3 mL/min (by C-G formula based on SCr of 0.64 mg/dL).    Allergies  Allergen Reactions  . Latex Itching, Rash and Other (See Comments)    Pt states she cannot use condoms - cause an infection.  Use of latex on skin is okay.  Tape causes rash  . Sweetening Enhancer [Flavoring Agent] Nausea And Vomiting and Other (See Comments)    HEADACHES  . Aspartame And Phenylalanine Nausea And Vomiting    HEADACHES  . Ibuprofen Other (See Comments)    HEADACHES  . Trazodone And Nefazodone Other (See Comments)    Hallucinations   . Triazolam Other (See Comments)    HALLUCINATIONS    Antimicrobials this admission: 4/17 CTX >>  4/17 Flagyl >>  4/18 Vancomycin >> 4/15 Vanc/Zosyn>>4/17  Dose adjustments this admission:   Microbiology results:  BCx:   UCx:    Sputum:    MRSA PCR:   Thank you for allowing pharmacy to be a part of this patient's care.  Dorrene German 04/12/2018 1:45 AM

## 2018-04-13 ENCOUNTER — Inpatient Hospital Stay (HOSPITAL_COMMUNITY): Payer: Medicare Other | Admitting: Anesthesiology

## 2018-04-13 ENCOUNTER — Encounter (HOSPITAL_COMMUNITY)
Admission: EM | Disposition: A | Discharge status: Rehabilitation Facility | Payer: Self-pay | Source: Home / Self Care | Attending: Internal Medicine

## 2018-04-13 ENCOUNTER — Inpatient Hospital Stay (HOSPITAL_COMMUNITY): Payer: Medicare Other

## 2018-04-13 ENCOUNTER — Encounter (HOSPITAL_COMMUNITY): Payer: Self-pay | Admitting: Orthopedic Surgery

## 2018-04-13 DIAGNOSIS — A419 Sepsis, unspecified organism: Secondary | ICD-10-CM

## 2018-04-13 DIAGNOSIS — E1142 Type 2 diabetes mellitus with diabetic polyneuropathy: Secondary | ICD-10-CM

## 2018-04-13 DIAGNOSIS — J449 Chronic obstructive pulmonary disease, unspecified: Secondary | ICD-10-CM

## 2018-04-13 DIAGNOSIS — M86271 Subacute osteomyelitis, right ankle and foot: Secondary | ICD-10-CM

## 2018-04-13 DIAGNOSIS — F330 Major depressive disorder, recurrent, mild: Secondary | ICD-10-CM

## 2018-04-13 DIAGNOSIS — R6520 Severe sepsis without septic shock: Secondary | ICD-10-CM

## 2018-04-13 DIAGNOSIS — L02611 Cutaneous abscess of right foot: Secondary | ICD-10-CM

## 2018-04-13 DIAGNOSIS — L0390 Cellulitis, unspecified: Secondary | ICD-10-CM

## 2018-04-13 DIAGNOSIS — I5030 Unspecified diastolic (congestive) heart failure: Secondary | ICD-10-CM

## 2018-04-13 HISTORY — PX: AMPUTATION: SHX166

## 2018-04-13 LAB — BASIC METABOLIC PANEL
ANION GAP: 9 (ref 5–15)
BUN: 7 mg/dL (ref 6–20)
CHLORIDE: 107 mmol/L (ref 101–111)
CO2: 24 mmol/L (ref 22–32)
Calcium: 8.4 mg/dL — ABNORMAL LOW (ref 8.9–10.3)
Creatinine, Ser: 0.58 mg/dL (ref 0.44–1.00)
GFR calc non Af Amer: >60 mL/min (ref >60)
GLUCOSE: 116 mg/dL — AB (ref 65–99)
Potassium: 3.3 mmol/L — ABNORMAL LOW (ref 3.5–5.1)
Sodium: 140 mmol/L (ref 135–145)

## 2018-04-13 LAB — GLUCOSE, CAPILLARY
GLUCOSE-CAPILLARY: 162 mg/dL — AB (ref 65–99)
GLUCOSE-CAPILLARY: 127 mg/dL — AB (ref 65–99)
Glucose-Capillary: 116 mg/dL — ABNORMAL HIGH (ref 65–99)
Glucose-Capillary: 76 mg/dL (ref 65–99)
Glucose-Capillary: 159 mg/dL — ABNORMAL HIGH (ref 65–99)
Glucose-Capillary: 168 mg/dL — ABNORMAL HIGH (ref 65–99)
Glucose-Capillary: 188 mg/dL — ABNORMAL HIGH (ref 65–99)

## 2018-04-13 LAB — ECHOCARDIOGRAM COMPLETE
HEIGHTINCHES: 65 in
WEIGHTICAEL: 3386.27 [oz_av]

## 2018-04-13 LAB — CBC
HEMATOCRIT: 34.4 % — AB (ref 36.0–46.0)
HEMOGLOBIN: 10.8 g/dL — AB (ref 12.0–15.0)
MCH: 30.4 pg (ref 26.0–34.0)
MCHC: 31.4 g/dL (ref 30.0–36.0)
MCV: 96.9 fL (ref 78.0–100.0)
Platelets: 293 10*3/uL (ref 150–400)
RBC: 3.55 MIL/uL — AB (ref 3.87–5.11)
RDW: 14.6 % (ref 11.5–15.5)
WBC: 13.7 10*3/uL — AB (ref 4.0–10.5)

## 2018-04-13 LAB — MAGNESIUM: Magnesium: 1.8 mg/dL (ref 1.7–2.4)

## 2018-04-13 SURGERY — AMPUTATION BELOW KNEE
Anesthesia: Monitor Anesthesia Care | Laterality: Right

## 2018-04-13 MED ORDER — NYSTATIN 100000 UNIT/GM EX CREA
TOPICAL_CREAM | Freq: Two times a day (BID) | CUTANEOUS | Status: DC
  Administered 2018-04-13 – 2018-04-16 (×7): via TOPICAL
  Filled 2018-04-13: qty 15

## 2018-04-13 MED ORDER — OXYCODONE HCL 5 MG PO TABS
5.0000 mg | ORAL_TABLET | ORAL | Status: DC | PRN
  Administered 2018-04-14: 10 mg via ORAL
  Filled 2018-04-13: qty 2
  Filled 2018-04-13: qty 1
  Filled 2018-04-13: qty 2

## 2018-04-13 MED ORDER — DOCUSATE SODIUM 100 MG PO CAPS
100.0000 mg | ORAL_CAPSULE | Freq: Two times a day (BID) | ORAL | Status: DC
  Administered 2018-04-13 – 2018-04-16 (×7): 100 mg via ORAL
  Filled 2018-04-13 (×7): qty 1

## 2018-04-13 MED ORDER — POTASSIUM CHLORIDE CRYS ER 20 MEQ PO TBCR
40.0000 meq | EXTENDED_RELEASE_TABLET | ORAL | Status: AC
  Administered 2018-04-13 (×2): 40 meq via ORAL
  Filled 2018-04-13 (×2): qty 2

## 2018-04-13 MED ORDER — CHLORHEXIDINE GLUCONATE 4 % EX LIQD: 60.0000 mL | Freq: Once | CUTANEOUS | Status: DC

## 2018-04-13 MED ORDER — LIDOCAINE 2% (20 MG/ML) 5 ML SYRINGE
INTRAMUSCULAR | Status: DC | PRN
  Administered 2018-04-13: 40 mg via INTRAVENOUS

## 2018-04-13 MED ORDER — MAGNESIUM CITRATE PO SOLN: 1.0000 | Freq: Once | ORAL | Status: DC | PRN

## 2018-04-13 MED ORDER — HYDROMORPHONE HCL 1 MG/ML IJ SOLN
0.5000 mg | INTRAMUSCULAR | Status: DC | PRN
  Filled 2018-04-13: qty 1

## 2018-04-13 MED ORDER — CEFAZOLIN SODIUM-DEXTROSE 1-4 GM/50ML-% IV SOLN
1.0000 g | Freq: Four times a day (QID) | INTRAVENOUS | Status: AC
  Administered 2018-04-13 – 2018-04-14 (×3): 1 g via INTRAVENOUS
  Filled 2018-04-13 (×3): qty 50

## 2018-04-13 MED ORDER — ONDANSETRON HCL 4 MG/2ML IJ SOLN: 4.0000 mg | Freq: Four times a day (QID) | INTRAMUSCULAR | Status: DC | PRN

## 2018-04-13 MED ORDER — LACTATED RINGERS IV SOLN
INTRAVENOUS | Status: DC
  Administered 2018-04-13 (×2): via INTRAVENOUS

## 2018-04-13 MED ORDER — CEFAZOLIN SODIUM-DEXTROSE 2-4 GM/100ML-% IV SOLN
2.0000 g | INTRAVENOUS | Status: AC
  Administered 2018-04-13: 2 g via INTRAVENOUS
  Filled 2018-04-13: qty 100

## 2018-04-13 MED ORDER — MIDAZOLAM HCL 2 MG/2ML IJ SOLN
2.0000 mg | Freq: Once | INTRAMUSCULAR | Status: AC
  Administered 2018-04-13: 2 mg via INTRAVENOUS

## 2018-04-13 MED ORDER — BISACODYL 10 MG RE SUPP: 10.0000 mg | Freq: Every day | RECTAL | Status: DC | PRN

## 2018-04-13 MED ORDER — METHOCARBAMOL 1000 MG/10ML IJ SOLN
500.0000 mg | Freq: Four times a day (QID) | INTRAVENOUS | Status: DC | PRN
  Filled 2018-04-13: qty 5

## 2018-04-13 MED ORDER — FENTANYL CITRATE (PF) 100 MCG/2ML IJ SOLN
100.0000 ug | Freq: Once | INTRAMUSCULAR | Status: AC
  Administered 2018-04-13: 100 ug via INTRAVENOUS

## 2018-04-13 MED ORDER — 0.9 % SODIUM CHLORIDE (POUR BTL) OPTIME
TOPICAL | Status: DC | PRN
  Administered 2018-04-13: 1000 mL

## 2018-04-13 MED ORDER — DEXMEDETOMIDINE HCL 200 MCG/2ML IV SOLN
INTRAVENOUS | Status: DC | PRN
  Administered 2018-04-13 (×3): 8 ug via INTRAVENOUS

## 2018-04-13 MED ORDER — PROPOFOL 10 MG/ML IV BOLUS
INTRAVENOUS | Status: DC | PRN
  Administered 2018-04-13: 40 mg via INTRAVENOUS
  Administered 2018-04-13 (×5): 20 mg via INTRAVENOUS
  Administered 2018-04-13: 30 mg via INTRAVENOUS

## 2018-04-13 MED ORDER — METHOCARBAMOL 500 MG PO TABS
500.0000 mg | ORAL_TABLET | Freq: Four times a day (QID) | ORAL | Status: DC | PRN
  Administered 2018-04-13 – 2018-04-16 (×4): 500 mg via ORAL
  Filled 2018-04-13 (×4): qty 1

## 2018-04-13 MED ORDER — FENTANYL CITRATE (PF) 100 MCG/2ML IJ SOLN: 25.0000 ug | INTRAMUSCULAR | Status: DC | PRN

## 2018-04-13 MED ORDER — METOCLOPRAMIDE HCL 5 MG PO TABS: 5.0000 mg | ORAL_TABLET | Freq: Three times a day (TID) | ORAL | Status: DC | PRN

## 2018-04-13 MED ORDER — OXYCODONE HCL 5 MG PO TABS
10.0000 mg | ORAL_TABLET | ORAL | Status: DC | PRN
  Administered 2018-04-13 – 2018-04-16 (×8): 15 mg via ORAL
  Filled 2018-04-13 (×8): qty 3

## 2018-04-13 MED ORDER — POLYETHYLENE GLYCOL 3350 17 G PO PACK: 17.0000 g | PACK | Freq: Every day | ORAL | Status: DC | PRN

## 2018-04-13 MED ORDER — METOCLOPRAMIDE HCL 5 MG/ML IJ SOLN: 5.0000 mg | Freq: Three times a day (TID) | INTRAMUSCULAR | Status: DC | PRN

## 2018-04-13 MED ORDER — ONDANSETRON HCL 4 MG PO TABS: 4.0000 mg | ORAL_TABLET | Freq: Four times a day (QID) | ORAL | Status: DC | PRN

## 2018-04-13 MED ORDER — SODIUM CHLORIDE 0.9 % IV SOLN
INTRAVENOUS | Status: DC
  Administered 2018-04-13: 13:00:00 via INTRAVENOUS

## 2018-04-13 MED ORDER — BUPIVACAINE-EPINEPHRINE (PF) 0.5% -1:200000 IJ SOLN
INTRAMUSCULAR | Status: DC | PRN
  Administered 2018-04-13 (×2): 30 mL via PERINEURAL

## 2018-04-13 MED ORDER — ACETAMINOPHEN 325 MG PO TABS
325.0000 mg | ORAL_TABLET | Freq: Four times a day (QID) | ORAL | Status: DC | PRN
  Administered 2018-04-15: 650 mg via ORAL
  Filled 2018-04-13: qty 2

## 2018-04-13 SURGICAL SUPPLY — 33 items
BLADE SAW RECIP 87.9 MT (BLADE) ×5 IMPLANT
BLADE SURG 21 STRL SS (BLADE) ×5 IMPLANT
BNDG COHESIVE 6X5 TAN STRL LF (GAUZE/BANDAGES/DRESSINGS) ×4 IMPLANT
BNDG GAUZE ELAST 4 BULKY (GAUZE/BANDAGES/DRESSINGS) ×2 IMPLANT
CANISTER WOUNDNEG PRESSURE 500 (CANNISTER) ×2 IMPLANT
COVER SURGICAL LIGHT HANDLE (MISCELLANEOUS) ×1 IMPLANT
CUFF TOURNIQUET SINGLE 34IN LL (TOURNIQUET CUFF) ×2 IMPLANT
CUFF TOURNIQUET SINGLE 44IN (TOURNIQUET CUFF) IMPLANT
DRAPE INCISE IOBAN 66X45 STRL (DRAPES) IMPLANT
DRAPE U-SHAPE 47X51 STRL (DRAPES) ×3 IMPLANT
DRESSING PREVENA PLUS CUSTOM (GAUZE/BANDAGES/DRESSINGS) ×1 IMPLANT
DRSG PREVENA PLUS CUSTOM (GAUZE/BANDAGES/DRESSINGS) ×3
ELECT REM PT RETURN 9FT ADLT (ELECTROSURGICAL) ×3
ELECTRODE REM PT RTRN 9FT ADLT (ELECTROSURGICAL) ×1 IMPLANT
GLOVE BIOGEL PI IND STRL 9 (GLOVE) ×1 IMPLANT
GLOVE BIOGEL PI INDICATOR 9 (GLOVE) ×2
GLOVE SS PI 9.0 STRL (GLOVE) ×2 IMPLANT
GLOVE SURG ORTHO 9.0 STRL STRW (GLOVE) ×1 IMPLANT
GOWN STRL REUS W/ TWL XL LVL3 (GOWN DISPOSABLE) ×2 IMPLANT
GOWN STRL REUS W/TWL XL LVL3 (GOWN DISPOSABLE) ×6
KIT BASIN OR (CUSTOM PROCEDURE TRAY) ×3 IMPLANT
KIT TURNOVER KIT B (KITS) ×3 IMPLANT
MANIFOLD NEPTUNE II (INSTRUMENTS) ×1 IMPLANT
NS IRRIG 1000ML POUR BTL (IV SOLUTION) ×3 IMPLANT
PACK ORTHO EXTREMITY (CUSTOM PROCEDURE TRAY) ×3 IMPLANT
PAD ARMBOARD 7.5X6 YLW CONV (MISCELLANEOUS) ×5 IMPLANT
SPONGE LAP 18X18 X RAY DECT (DISPOSABLE) ×2 IMPLANT
STAPLER VISISTAT 35W (STAPLE) ×3 IMPLANT
STOCKINETTE IMPERVIOUS LG (DRAPES) ×3 IMPLANT
SUT SILK 2 0 (SUTURE) ×3
SUT SILK 2-0 18XBRD TIE 12 (SUTURE) ×1 IMPLANT
SUT VIC AB 1 CTX 27 (SUTURE) ×4 IMPLANT
TOWEL OR 17X26 10 PK STRL BLUE (TOWEL DISPOSABLE) ×3 IMPLANT

## 2018-04-13 NOTE — Anesthesia Preprocedure Evaluation (Signed)
Anesthesia Evaluation  Patient identified by MRN, date of birth, ID band Patient awake    Reviewed: Allergy & Precautions, NPO status , Patient's Chart, lab work & pertinent test results  History of Anesthesia Complications Negative for: history of anesthetic complications  Airway Mallampati: II  TM Distance: >3 FB Neck ROM: Full    Dental  (+) Dental Advisory Given   Pulmonary sleep apnea , COPD, Current Smoker,    breath sounds clear to auscultation       Cardiovascular hypertension, (-) angina(-) Past MI  Rhythm:Regular     Neuro/Psych  Headaches, PSYCHIATRIC DISORDERS Anxiety Depression Bipolar Disorder  Neuromuscular disease CVA    GI/Hepatic hiatal hernia, GERD  Medicated and Controlled,  Endo/Other  diabetes, Type 2, Insulin DependentMorbid obesity  Renal/GU      Musculoskeletal  (+) Arthritis ,   Abdominal   Peds  Hematology   Anesthesia Other Findings   Reproductive/Obstetrics                             Anesthesia Physical Anesthesia Plan  ASA: III  Anesthesia Plan: MAC and Regional   Post-op Pain Management:    Induction:   PONV Risk Score and Plan: 1  Airway Management Planned: Nasal Cannula  Additional Equipment:   Intra-op Plan:   Post-operative Plan:   Informed Consent: I have reviewed the patients History and Physical, chart, labs and discussed the procedure including the risks, benefits and alternatives for the proposed anesthesia with the patient or authorized representative who has indicated his/her understanding and acceptance.   Dental advisory given  Plan Discussed with: CRNA and Surgeon  Anesthesia Plan Comments:         Anesthesia Quick Evaluation

## 2018-04-13 NOTE — Anesthesia Procedure Notes (Signed)
Procedure Name: MAC Date/Time: 04/13/2018 9:22 AM Performed by: Renato Shin, CRNA Pre-anesthesia Checklist: Patient identified, Emergency Drugs available, Suction available and Patient being monitored Patient Re-evaluated:Patient Re-evaluated prior to induction Oxygen Delivery Method: Nasal cannula Preoxygenation: Pre-oxygenation with 100% oxygen Induction Type: IV induction Placement Confirmation: positive ETCO2,  CO2 detector and breath sounds checked- equal and bilateral Dental Injury: Teeth and Oropharynx as per pre-operative assessment

## 2018-04-13 NOTE — Transfer of Care (Signed)
Immediate Anesthesia Transfer of Care Note  Patient: [name]  Procedure(s) Performed: RIGHT BELOW KNEE AMPUTATION (Right )  Patient Location: PACU  Anesthesia Type:MAC combined with regional for post-op pain  Level of Consciousness: awake, alert , oriented and patient cooperative  Airway & Oxygen Therapy: Patient Spontanous Breathing and Patient connected to nasal cannula oxygen  Post-op Assessment: Report given to RN and Post -op Vital signs reviewed and stable  Post vital signs: Reviewed and stable  Last Vitals:  Vitals Value Taken Time  BP 106/43 04/13/2018 10:07 AM  Temp    Pulse 76 04/13/2018 10:08 AM  Resp 19 04/13/2018 10:08 AM  SpO2 92 % 04/13/2018 10:08 AM  Vitals shown include unvalidated device data.  Last Pain:  Vitals:   04/13/18 0534  TempSrc: Oral  PainSc:          Complications: No apparent anesthesia complications

## 2018-04-13 NOTE — Progress Notes (Signed)
Orthopedic Tech Progress Note Patient Details:  [name] Jul 06, 1966 [id number] Brace completed by bio-tech Patient ID: [name], female   DOB: 07/27/1966, 52 y.o.   MRN: [id number]   [name] 04/13/2018, 4:11 PM

## 2018-04-13 NOTE — Progress Notes (Signed)
Orthopedic Tech Progress Note Patient Details:  [name] 07-29-1966 [id number]  Patient ID: [name], female   DOB: [birth year], 52 y.o.   MRN: [id number]   [name] 04/13/2018, 1:06 PM Called in bio-tech brace order; spoke with answering service

## 2018-04-13 NOTE — Progress Notes (Signed)
  Echocardiogram 2D Echocardiogram has been performed.  [name] 04/13/2018, 3:20 PM

## 2018-04-13 NOTE — Progress Notes (Signed)
Received report from day shift stating pt was waiting on bed at [facility] and would be transferred for possible amputation. No receiving bed ever populated. Throughout the night patient voiced many concerns about surgery. Stated that she didn't understand why she was was having surgery, explained to patient that she does have bone infection and that doctors will discuss her options. Pt states that she understands that she's supposed to get transferred to [facility] to discuss options with MD there. Around 0600 [facility] called asking why patient had not been transferred to [facility] throughout the night. Informed nurse that we never had a bed, were waiting on a bed for the patient. Instructed to call report to short stay. Informed pt that surgery is now planned for today. Explained to patient that she could discuss concerns with surgeon and did not have to sign the consent with me if she did not feel comfortable doing so. Pt states that she would feel more comfortable if she were able to discuss surgery with the surgeon first. Short stay called again, informed them that consent was not signed, but pt had already received her CHG bath. Pt was not kept completely NPO as surgery was not anticipated. Pt had a full can of coca-cola (240 mL) this information was relayed to nurse who discussed with anesthesia. [facility] called asking if they could come pick her up after 0700 when shift changed. Asked short stay, they said it should be fine as pt is not supposed to go to surgery until 0900. [facility] has now arrived to transport pt to [place] via stretcher.

## 2018-04-13 NOTE — Anesthesia Postprocedure Evaluation (Signed)
Anesthesia Post Note  Patient: [name]  Procedure(s) Performed: RIGHT BELOW KNEE AMPUTATION (Right )     Patient location during evaluation: PACU Anesthesia Type: Regional and MAC Level of consciousness: awake and alert Pain management: pain level controlled Vital Signs Assessment: post-procedure vital signs reviewed and stable Respiratory status: spontaneous breathing, nonlabored ventilation, respiratory function stable and patient connected to nasal cannula oxygen Cardiovascular status: stable and blood pressure returned to baseline Postop Assessment: no apparent nausea or vomiting Anesthetic complications: no    Last Vitals:  Vitals:   04/13/18 1155 04/13/18 1232  BP: (!) 115/56 128/71  Pulse: 73 77  Resp: (!) 9 19  Temp: 36.5 C 36.6 C  SpO2: 98% 100%    Last Pain:  Vitals:   04/13/18 1420  TempSrc:   PainSc: 9                  Orchid Glassberg

## 2018-04-13 NOTE — Progress Notes (Signed)
Pt arrived to room 5N06 via stretcher accompanied by family. No questions or concerns at this time. Pt currently on 3L O2 nasal canula without respiratory distress at this time. Wound vac in place. See flowsheets for assessment. Received report from Remo Lipps, [place] from PACU. Will continue to monitor.

## 2018-04-13 NOTE — Progress Notes (Signed)
[facility] partner with [facility] for home infusion. [facility] in house rep was called [phone].

## 2018-04-13 NOTE — Anesthesia Procedure Notes (Signed)
Anesthesia Regional Block: Femoral nerve block   Pre-Anesthetic Checklist: ,, timeout performed, Correct Patient, Correct Site, Correct Laterality, Correct Procedure, Correct Position, site marked, Risks and benefits discussed,  Surgical consent,  Pre-op evaluation,  At surgeon's request and post-op pain management  Laterality: Lower and Right  Prep: chloraprep       Needles:  Injection technique: Single-shot  Needle Type: Echogenic Stimulator Needle          Additional Needles:   Procedures:,,,, ultrasound used (permanent image in chart),,,,  Narrative:  Start time: 04/13/2018 9:03 AM End time: 04/13/2018 9:11 AM Injection made incrementally with aspirations every 5 mL.  Performed by: Personally  Anesthesiologist: Oleta Mouse, MD  Additional Notes: H+P and labs reviewed, risks and benefits discussed with patient, procedure tolerated well without complications

## 2018-04-13 NOTE — Op Note (Signed)
   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  Patient has a strong dorsalis pedis pulse.[name]  PREOPERATIVE DIAGNOSIS: Diabetic insensate neuropathy Charcot collapse with osteomyelitis ulceration and abscess right foot  POSTOPERATIVE DIAGNOSIS: Same.  PROCEDURE: Transtibial amputation Application of Prevena wound VAC  SURGEON: [name], M.D.  ANESTHESIA:  general  IV FLUIDS AND URINE: See anesthesia.  ESTIMATED BLOOD LOSS: Minimal mL.  Tourniquet time 6 minutes at 372 mmHg  COMPLICATIONS: None.  DESCRIPTION OF PROCEDURE: The patient was brought to the operating room and underwent a general anesthetic. After adequate levels of anesthesia were obtained patient's lower extremity was prepped using DuraPrep draped into a sterile field. A timeout was called. The foot was draped out of the sterile field with impervious stockinette. A transverse incision was made 11 cm distal to the tibial tubercle. This curved proximally and a large posterior flap was created. The tibia was transected 1 cm proximal to the skin incision. The fibula was transected just proximal to the tibial incision. The tibia was beveled anteriorly. A large posterior flap was created. The sciatic nerve was pulled cut and allowed to retract. The vascular bundles were suture ligated with 2-0 silk. The deep and superficial fascial layers were closed using #1 Vicryl. The skin was closed using staples and 2-0 nylon. The wound was covered with a Prevena wound VAC. There was a good suction fit. A prosthetic shrinker was applied. Patient was extubated taken to the PACU in stable condition.   DISCHARGE PLANNING:  Antibiotic duration: 24 hours postoperatively.  Weightbearing: Nonweightbearing on the right.  Pain medication: Ordered.  Dressing care/ Wound VAC: Continue wound VAC for 1 week  at discharge with the portable Praveena wound VAC pump  Discharge to: Patient may need discharge to inpatient versus outpatient rehabilitation.  Follow-up: In the office 1 week post operative.  [name] Score, MD [place] 10:02 AM

## 2018-04-13 NOTE — Progress Notes (Signed)
Patient picked up by [facility]

## 2018-04-13 NOTE — Consult Note (Signed)
ORTHOPAEDIC CONSULTATION  REQUESTING PHYSICIAN: Florencia Reasons, MD  Chief Complaint: Ulceration osteomyelitis Charcot collapse right foot with abscess  HPI: [name] is a 52 y.o. female who presents with Charcot collapse right foot with diabetic insensate neuropathy with osteomyelitis abscess and ulceration.  Patient also was a smoker with uncontrolled type 2 diabetes A1c greater than 9.  Past Medical History:  Diagnosis Date  . Anxiety   . Arthritis   . Bipolar disorder ([place])   . Broken jaw ([place])   . Broken wrist   . Chronic back pain   . Claudication ([place])    a. 12/2013 ABI's: R 0.97, L 0.94.  [name] COPD (chronic obstructive pulmonary disease) ([place])   . Depression   . Diabetic Charcot's foot ([place])   . Diabetic foot ulcer ([facility])    chronic left foot ulcer , great toe/  hx recurrent foot ulcer bilaterally  . DJD (degenerative joint disease)   . Edema of both lower extremities   . Episode of memory loss   . GERD (gastroesophageal reflux disease)   . Headache(784.0)   . Hiatal hernia   . History of colon cancer, stage I dx 02-26-2015--- oncologist-- dr [name]--- per last note no recurrence   05-16-2015 s/p  Laparoscopic sigmoid colectomy w/ node bx's (negative nodes per path)  Stage I (pT1,N0,M0) Grade 2  . 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   per MRI multiple small strokes post cardiac cath. --  residual mild memory loss  . History of methicillin resistant staphylococcus aureus (MRSA)   . Hypercholesterolemia   . Insomnia   . Insulin dependent type 2 diabetes mellitus, uncontrolled ([place]) dx 2004   endocrinologist-  dr Dwyane Dee-- last A1c 8.8 in Aug2018:  pt is noncompliant w/ diet, states does note eat breakfast , her first main meal in afternoon  . Neurogenic bladder   . Neuropathy, diabetic ([place])    hands and feet  . OSA (obstructive sleep apnea)    cpap intolerant  . Personality disorder ([place])   . SOB (shortness of breath) on exertion   .  Stroke ([place])   . SUI (stress urinary incontinence, female) S/P SLING 12-29-2011   Past Surgical History:  Procedure Laterality Date  . ABDOMINAL HYSTERECTOMY    . ANTERIOR CERVICAL DECOMP/DISCECTOMY FUSION  2000   C5 - 7  . CARDIAC CATHETERIZATION  05-22-2008   DR SKAINS   NO SIGNIFECANT CAD/ NORMAL LV/  EF 65%/  NO WALL MOTION ABNORMALITIES  . CARPAL TUNNEL RELEASE Right 04-25-2013  . [place]; 1992  . COLONOSCOPY    . CYSTO N/A 04/30/2013   Procedure: CYSTOSCOPY;  Surgeon: Reece Packer, MD;  Location: [facility];  Service: Urology;  Laterality: N/A;  . CYSTOSCOPY MACROPLASTIQUE IMPLANT N/A 02/06/2018   Procedure: CYSTOSCOPY MACROPLASTIQUE IMPLANT;  Surgeon: Bjorn Loser, MD;  Location[facility];  Service: Urology;  Laterality: N/A;  . CYSTOSCOPY WITH INJECTION  05/04/2012   Procedure: CYSTOSCOPY WITH INJECTION;  Surgeon: Reece Packer, MD;  Location: [facility] [facility];  Service: Urology;  Laterality: N/A;  MACROPLASTIQUE INJECTION  . CYSTOSCOPY WITH INJECTION  08/28/2012   Procedure: CYSTOSCOPY WITH INJECTION;  Surgeon: Reece Packer, MD;  Location: [facility];  Service: Urology;  Laterality: N/A;  cysto and macroplastique   . FOOT SURGERY Bilateral   . HERNIA REPAIR  ?1996   "stomach"  . INCISION AND DRAINAGE OF WOUND Right  04/11/2018   Procedure: IRRIGATION AND DEBRIDEMENT WOUND right foot and right ankle;  Surgeon: Evelina Bucy, DPM;  Location: [facility];  Service: Podiatry;  Laterality: Right;  . KNEE ARTHROSCOPY W/ ALLOGRAFT IMPANT Left    graft x 2  . KNEE SURGERY     TOTAL 8 SURG'S  . LAPAROSCOPIC SIGMOID COLECTOMY N/A 04/22/2015   Procedure: LAPAROSCOPIC HAND ASSISTED SIGMOID COLECTOMY;  Surgeon: Erroll Luna, MD;  Location: [place];  Service: General;  Laterality: N/A;  . LEFT HEART CATHETERIZATION WITH CORONARY ANGIOGRAM N/A 02/12/2014   Procedure: LEFT HEART CATHETERIZATION WITH CORONARY ANGIOGRAM;  Surgeon:  Candee Furbish, MD;  Location: [facility] CATH LAB;  Service: Cardiovascular;  Laterality: N/A;   No angiographically significant CAD; normal LVSF, LVEDP 56mmHg,  EF 55% (new finding ef 30% myoview 01-08-2014)  . LUMBAR FUSION    . MANDIBLE FRACTURE SURGERY    . MULTIPLE LAPAROSCOPIES FOR ENDOMETRIOSIS    . PUBOVAGINAL SLING  12/29/2011   Procedure: Gaynelle Arabian;  Surgeon: Reece Packer, MD;  Location: [facility];  Service: Urology;  Laterality: N/A;  cysto and sparc sling   . PUBOVAGINAL SLING N/A 04/30/2013   Procedure: REMOVAL OF VAGINAL MESH;  Surgeon: Reece Packer, MD;  Location: [facility];  Service: Urology;  Laterality: N/A;  . RECONSTURCTION OF CONGENITAL UTERUS ANOMALY  1983  . REPEAT RECONSTRUCTION ACL LEFT KNEE/ SCREWS REMOVED  03-28-2000   CADAVER GRAFT  . TOTAL ABDOMINAL HYSTERECTOMY W/ BILATERAL SALPINGOOPHORECTOMY  1997  . TRANSTHORACIC ECHOCARDIOGRAM  02/13/2014   ef 45%, hypokinesis base inferior and base inferolateral walls  . UPPER GI ENDOSCOPY    . WOUND DEBRIDEMENT Right 09/05/2016   Procedure: DEBRIDEMENT WOUND WITH GRAFT RIGHT FOOT;  Surgeon: Landis Martins, DPM;  Location: [place];  Service: Podiatry;  Laterality: Right;  . WRIST FRACTURE SURGERY     Social History   Socioeconomic History  . Marital status: Divorced    Spouse name: Not on file  . Number of children: 2  . Years of education: 6th  . Highest education level: Not on file  Occupational History  . Not on file  Social Needs  . Financial resource strain: Not on file  . Food insecurity:    Worry: Not on file    Inability: Not on file  . Transportation needs:    Medical: Not on file    Non-medical: Not on file  Tobacco Use  . Smoking status: Current Every Day Smoker    Packs/day: 4.00    Years: 45.00    Pack years: 180.00    Types: Cigarettes  . Smokeless tobacco: Never Used  . Tobacco comment: per pt started smoking  age 61  Substance and Sexual Activity  . Alcohol use: No     Alcohol/week: 0.0 oz  . Drug use: No  . Sexual activity: Yes  Lifestyle  . Physical activity:    Days per week: Not on file    Minutes per session: Not on file  . Stress: Not on file  Relationships  . Social connections:    Talks on phone: Not on file    Gets together: Not on file    Attends religious service: Not on file    Active member of club or organization: Not on file    Attends meetings of clubs or organizations: Not on file    Relationship status: Not on file  Other Topics Concern  . Not on file  Social History Narrative  Patient is divorced and lives alone- independent in ADLs, Drives   Patient has two adult children- grown son and daughter who is special needs in a home   Patient is disabled since 72   Patient has a 6th grade education.   Patient is right-handed.   Depression-medication and therapist   Patient drinks 2- 3 liters of soda and drinks tea but not everyday.    Does not routinely exercise.      Patient reports that she drinks "a lot" of caffeine daily    Family History  Problem Relation Age of Onset  . Hypertension Mother   . Diabetes Mother   . Cancer - Other Mother        lymphoma   . Cancer - Other Father        lung, bladder cancer   . Heart attack Father   . Cancer - Other Brother        bladder cancer    - negative except otherwise stated in the family history section Allergies  Allergen Reactions  . Latex Itching, Rash and Other (See Comments)    Pt states she cannot use condoms - cause an infection.  Use of latex on skin is okay.  Tape causes rash  . Sweetening Enhancer [Flavoring Agent] Nausea And Vomiting and Other (See Comments)    HEADACHES  . Aspartame And Phenylalanine Nausea And Vomiting    HEADACHES  . Ibuprofen Other (See Comments)    HEADACHES  . Trazodone And Nefazodone Other (See Comments)    Hallucinations   . Triazolam Other (See Comments)    HALLUCINATIONS   Prior to Admission medications   Medication Sig  Start Date End Date Taking? Authorizing Provider  Cariprazine HCl (VRAYLAR) 4.5 MG CAPS Take 4.5 mg by mouth at bedtime.   Yes [provider]  cyclobenzaprine (FLEXERIL) 10 MG tablet Take 1 tablet (10 mg total) by mouth 3 (three) times daily as needed for muscle spasms. 02/27/18  Yes Stover, Titorya, DPM  empagliflozin (JARDIANCE) 25 MG TABS tablet Take 25 mg by mouth daily. 02/02/18  Yes Elayne Snare, MD  gabapentin (NEURONTIN) 600 MG tablet TAKE 1 TABLET (600 MG TOTAL) BY MOUTH 2 (TWO) TIMES DAILY. 01/24/18  Yes Patel, Donika K, DO  insulin aspart (NOVOLOG) 100 UNIT/ML FlexPen Take 10 units before your main meal. If blood sugars are consistently over 250 after the meal start taking 14 units before your main meal 35 UNITS AT SUPPER 04/03/18  Yes Elayne Snare, MD  Insulin Glargine (BASAGLAR KWIKPEN) 100 UNIT/ML SOPN Inject 0.36 mLs (36 Units total) into the skin daily before breakfast. And pen needles 1/day 12/11/17  Yes Elayne Snare, MD  lidocaine (LIDODERM) 5 % Place 1 patch onto the skin daily as needed (pain). Remove & Discard patch within 12 hours or as directed by MD 02/27/18  Yes Landis Martins, DPM  Melatonin 10 MG CAPS Take 10 mg by mouth at bedtime.    Yes [provider]  nortriptyline (PAMELOR) 10 MG capsule Start nortriptyline 10mg  at bedtime for 2 week, then increase to 2 tablet at bedtime 02/12/18  Yes Patel, Donika K, DO  QUEtiapine (SEROQUEL) 300 MG tablet Take 600 mg by mouth at bedtime.   Yes [provider]  rOPINIRole (REQUIP) 2 MG tablet Take 2 mg by mouth daily.   Yes [provider]  tamsulosin (FLOMAX) 0.4 MG CAPS capsule Take 0.4 mg by mouth every morning.  08/31/17  Yes [provider]  Tasimelteon (  HETLIOZ) 20 MG CAPS Take 20 mg by mouth at bedtime.   Yes [provider]  Valbenazine Tosylate (INGREZZA) 80 MG CAPS Take 80 mg by mouth daily.   Yes [provider]  zolpidem (AMBIEN) 5 MG tablet Take 5 mg by mouth at bedtime as  needed for sleep.   Yes [provider]  glucose blood (FREESTYLE LITE) test strip Use to check blood sugars up to 4 times daily. Dx Code E11.65 03/16/18   Elayne Snare, MD  INVOKANA 300 MG TABS tablet TAKE 1 TABLET BY MOUTH EVERY DAY BEFORE BREAKFAST Patient not taking: Reported on 04/09/2018 03/23/18   Elayne Snare, MD  SYRINGE-NEEDLE, DISP, 3 ML (LUER LOCK SAFETY SYRINGES) 25G X 1" 3 ML MISC 1 Package by Does not apply route daily. 02/12/18   Narda Amber K, DO   No results found. - pertinent xrays, CT, MRI studies were reviewed and independently interpreted  Positive ROS: All other systems have been reviewed and were otherwise negative with the exception of those mentioned in the HPI and as above.  Physical Exam: General: Alert, no acute distress Psychiatric: Patient is competent for consent with normal mood and affect Lymphatic: No axillary or cervical lymphadenopathy Cardiovascular: No pedal edema Respiratory: No cyanosis, no use of accessory musculature GI: No organomegaly, abdomen is soft and non-tender  Skin: Patient has cellulitis of the right foot.  She has a large ulcer over the Charcot rocker-bottom deformity of the right foot.  This encompasses the entire midfoot and hindfoot.  Images:  @ENCIMAGES @   Neurologic: Patient does not have protective sensation bilateral lower extremities.   MUSCULOSKELETAL:  Patient has a good dorsalis pedis pulse she has venous stasis swelling in both legs she has a small Waggoner grade 1 ulcer beneath the first metatarsal head of the left foot with a ulcer that probes to bone over the Charcot rocker-bottom deformity of the right foot.  Assessment: Assessment: Diabetic insensate neuropathy smoker uncontrolled diabetes with osteomyelitis abscess ulceration with Charcot rocker-bottom deformity of the right foot.  Plan: Plan: Discussed that her only option would be to proceed with a transtibial amputation discussed risks of infection  neurovascular injury nonhealing of the wound need for additional surgery.  Had a long discussion regarding proper diet for diabetes the critical nature to stop smoking and to improve glucose control.  Patient states she understands wishes to proceed with surgery at this time.  Thank you for the consult and the opportunity to see Ms. Herma Ard, MD [facility] [phone] 9:01 AM

## 2018-04-13 NOTE — Progress Notes (Addendum)
PROGRESS NOTE    [name]   YBO:[id number]  DOB: 26-Aug-1966  DOA: 04/09/2018 PCP: Tamsen Roers, MD   Brief Narrative:  [name]  is a 52 y.o. female with a known history of DM, COPD, GERD, OSA , CVA, HLD and insomnia presents to the emergency department for evaluation of AMS.  Patient was in a usual state of health until two days ago when she became confused & weak.  Husband states that she has had worsening of a wound on the bottom of her right foot associated with Charcot deformity and is scheduled to see podiatry for procedure on 4/22.  States right lower extremity is red, swollen and wound is draining.  He initially attributed her AMS to a new sleeping pill for her insomnia.  In the [facility] long ED the patient was awake, arousable but did not answer questions appropriately.  Her podiatrist, Dr March Rummage was consulted and she was taken for an I and D on 4/17. Was febrile up to 101.4 at midnight. Noted to have blood cultures post for Strep Agalactiae.  Dr March Rummage recommended an ortho consult on 3/18 for a BKA. 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   Subjective: Main concern is being able to go home as soon as possible. She has no complaints.  ROS: no complaints of nausea, vomiting, constipation diarrhea, cough, dyspnea or dysuria. No other complaints.   Assessment & Plan:   Active Problems:    Severe sepsis/ strep agalactiae wound infection and subsequent bacteremia   Cutaneous abscess of right foot   Subacute osteomyelitis, right ankle and foot  - Dr Baxter Flattery originally recommended 4 wks of Ampicillin, however, as an amputation has been done, 10-14 days to cover for bacteremia might be sufficient- have asked ID for new recommendations -I have spoken with Dr Baxter Flattery  ID recommends 10 days of antibiotics after neg blood cultures - can switch to Amoxicillin on d/c- follow repeat blood cultures and 2 D ECHO which has been done today please - I have  texted Dr Sharol Given > need the following for d/c which he has ordered>  a stump shrinker and stump protector (bio-tech brace) for her - she will also go with a Prevena plus portable wound vac from the hospital and will need to f/u 1 wk after the surgery  Cigarette smoker/ COPD - nicotine patch- needs to stop smoking - Albutero, Atrovent l PRN  DM2 uncontrolled with neuropathy-  - A1c 9.2 on 3/19 - cont Lantus 36 U and SSI - Jardiance and Invokana on hold - cont Neurontin and   Major depressive disorder, recurrent, mild Polypharmacy  - numerous home meds on hold   - psych evaluated  the patient on 4/17 and recommended to start Celexa 10 mg daily and Hydroxyzine 25 mg TID PRN for anxiety    Hypokalemia - replace  Addendum: called about a vaginal rash which she wants Desitin for - have looked it and it appears to be fungal- will prescribed Nystatin.  DVT prophylaxis: Lovenox Code Status: Full code Family Communication: husband  Disposition Plan: awaiting PT eval- may be able to go home tomorrow if she gets the stump protector Consultants:   Podiatry  Ortho  ID Procedures:   Right foot I and D  Right BKA Antimicrobials:  Anti-infectives (From admission, onward)   Start     Dose/Rate Route Frequency Ordered Stop   04/13/18 1500  ceFAZolin (ANCEF) IVPB 1 g/50  mL premix     1 g 100 mL/hr over 30 Minutes Intravenous Every 6 hours 04/13/18 1230 04/14/18 0859   04/13/18 0815  ceFAZolin (ANCEF) IVPB 2g/100 mL premix     2 g 200 mL/hr over 30 Minutes Intravenous On call to O.R. 04/13/18 0804 04/13/18 0915   04/12/18 1400  ampicillin (OMNIPEN) 2 g in sodium chloride 0.9 % 100 mL IVPB     2 g 300 mL/hr over 20 Minutes Intravenous Every 4 hours 04/12/18 1250     04/12/18 0200  vancomycin (VANCOCIN) 1,500 mg in sodium chloride 0.9 % 500 mL IVPB  Status:  Discontinued     1,500 mg 250 mL/hr over 120 Minutes Intravenous Daily 04/12/18 0143 04/12/18 1023   04/11/18 1845  vancomycin  (VANCOCIN) powder 500 mg     500 mg Other To Surgery 04/11/18 1837 04/11/18 1916   04/11/18 1600  cefTRIAXone (ROCEPHIN) 2 g in sodium chloride 0.9 % 100 mL IVPB  Status:  Discontinued     2 g 200 mL/hr over 30 Minutes Intravenous Every 24 hours 04/11/18 1102 04/12/18 1246   04/11/18 1600  metroNIDAZOLE (FLAGYL) IVPB 500 mg  Status:  Discontinued     500 mg 100 mL/hr over 60 Minutes Intravenous Every 8 hours 04/11/18 1102 04/12/18 1246   04/10/18 2000  vancomycin (VANCOCIN) 1,500 mg in sodium chloride 0.9 % 500 mL IVPB  Status:  Discontinued     1,500 mg 250 mL/hr over 120 Minutes Intravenous Every 24 hours 04/09/18 2121 04/11/18 1102   04/09/18 2359  piperacillin-tazobactam (ZOSYN) IVPB 3.375 g     3.375 g 12.5 mL/hr over 240 Minutes Intravenous Every 8 hours 04/09/18 2121 04/11/18 1130   04/09/18 2315  piperacillin-tazobactam (ZOSYN) IVPB 3.375 g  Status:  Discontinued     3.375 g 100 mL/hr over 30 Minutes Intravenous  Once 04/09/18 2311 04/09/18 2316   04/09/18 2315  vancomycin (VANCOCIN) IVPB 1000 mg/200 mL premix  Status:  Discontinued     1,000 mg 200 mL/hr over 60 Minutes Intravenous  Once 04/09/18 2311 04/09/18 2317   04/09/18 1815  vancomycin (VANCOCIN) 2,000 mg in sodium chloride 0.9 % 500 mL IVPB     2,000 mg 250 mL/hr over 120 Minutes Intravenous STAT 04/09/18 1813 04/09/18 2150   04/09/18 1800  piperacillin-tazobactam (ZOSYN) IVPB 3.375 g     3.375 g 100 mL/hr over 30 Minutes Intravenous  Once 04/09/18 1757 04/09/18 1932   04/09/18 1800  vancomycin (VANCOCIN) IVPB 1000 mg/200 mL premix  Status:  Discontinued     1,000 mg 200 mL/hr over 60 Minutes Intravenous  Once 04/09/18 1757 04/09/18 1813       Objective: Vitals:   04/13/18 1125 04/13/18 1140 04/13/18 1155 04/13/18 1232  BP: 128/65 126/61 (!) 115/56 128/71  Pulse: 82 75 73 77  Resp: 16 18 (!) 9 19  Temp:   97.7 F (36.5 C) 97.8 F (36.6 C)  TempSrc:    Oral  SpO2: 97% 97% 98% 100%  Weight:      Height:         Intake/Output Summary (Last 24 hours) at 04/13/2018 1517 Last data filed at 04/13/2018 1245 Gross per 24 hour  Intake 2070 ml  Output 4050 ml  Net -1980 ml   Filed Weights   04/09/18 2251 04/11/18 1700 04/13/18 0820  Weight: 96.2 kg (212 lb 1.3 oz) 96 kg (211 lb 10.3 oz) 96 kg (211 lb 10.3 oz)    Examination: General exam:  Appears comfortable  HEENT: PERRLA, oral mucosa moist, no sclera icterus or thrush Respiratory system: Clear to auscultation. Respiratory effort normal. Cardiovascular system: S1 & S2 heard, RRR.   Gastrointestinal system: Abdomen soft, non-tender, nondistended. Normal bowel sound. No organomegaly Central nervous system: Alert and oriented. No focal neurological deficits. Extremities: No cyanosis, clubbing - right BKA- dressing not opened Skin: No rashes or ulcers Psychiatry:  Mood & affect appropriate.     Data Reviewed: I have personally reviewed following labs and imaging studies  CBC: Recent Labs  Lab 04/09/18 1828 04/10/18 0542 04/12/18 0553 04/13/18 0555  WBC 30.3* 22.8* 12.6* 13.7*  NEUTROABS 28.2*  --   --   --   HGB 13.0 10.5* 10.1* 10.8*  HCT 39.1 32.0* 30.8* 34.4*  MCV 95.1 96.1 96.0 96.9  PLT 261 221 232 859   Basic Metabolic Panel: Recent Labs  Lab 04/09/18 1828 04/10/18 0542 04/11/18 0553 04/12/18 0553 04/13/18 0555  NA 131* 132*  --  138 140  K 4.6 3.8  --  3.2* 3.3*  CL 90* 97*  --  106 107  CO2 25 25  --  23 24  GLUCOSE 385* 166*  --  161* 116*  BUN 15 14  --  6 7  CREATININE 1.05* 0.84 0.64 0.58 0.58  CALCIUM 9.5 8.1*  --  8.2* 8.4*  MG  --   --   --  1.7 1.8   GFR: Estimated Creatinine Clearance: 94.3 mL/min (by C-G formula based on SCr of 0.58 mg/dL). Liver Function Tests: Recent Labs  Lab 04/09/18 1828 04/10/18 0542  AST 16 10*  ALT 16 11*  ALKPHOS 110 87  BILITOT 0.3 0.5  PROT 7.6 6.0*  ALBUMIN 3.3* 2.4*   No results for input(s): LIPASE, AMYLASE in the last 168 hours. No results for input(s):  AMMONIA in the last 168 hours. Coagulation Profile: No results for input(s): INR, PROTIME in the last 168 hours. Cardiac Enzymes: No results for input(s): CKTOTAL, CKMB, CKMBINDEX, TROPONINI in the last 168 hours. BNP (last 3 results) No results for input(s): PROBNP in the last 8760 hours. HbA1C: No results for input(s): HGBA1C in the last 72 hours. CBG: Recent Labs  Lab 04/12/18 2358 04/13/18 0414 04/13/18 0631 04/13/18 0724 04/13/18 1008  GLUCAP 116* 76 159* 162* 168*   Lipid Profile: Recent Labs    04/12/18 0553  CHOL 76  HDL 15*  LDLCALC 37  TRIG 122  CHOLHDL 5.1   Thyroid Function Tests: No results for input(s): TSH, T4TOTAL, FREET4, T3FREE, THYROIDAB in the last 72 hours. Anemia Panel: No results for input(s): VITAMINB12, FOLATE, FERRITIN, TIBC, IRON, RETICCTPCT in the last 72 hours. Urine analysis:    Component Value Date/Time   COLORURINE YELLOW 04/09/2018 1828   APPEARANCEUR HAZY (A) 04/09/2018 1828   LABSPEC 1.022 04/09/2018 1828   PHURINE 6.0 04/09/2018 1828   GLUCOSEU >=500 (A) 04/09/2018 1828   GLUCOSEU >=1000 (A) 01/07/2015 1410   HGBUR NEGATIVE 04/09/2018 1828   BILIRUBINUR NEGATIVE 04/09/2018 1828   KETONESUR NEGATIVE 04/09/2018 1828   PROTEINUR NEGATIVE 04/09/2018 1828   UROBILINOGEN 0.2 04/23/2015 2355   NITRITE NEGATIVE 04/09/2018 1828   LEUKOCYTESUR SMALL (A) 04/09/2018 1828   Sepsis Labs: @LABRCNTIP (procalcitonin:4,lacticidven:4) ) Recent Results (from the past 240 hour(s))  Blood Culture (routine x 2)     Status: Abnormal   Collection Time: 04/09/18  6:28 PM  Result Value Ref Range Status   Specimen Description   Final    BLOOD BLOOD LEFT  FOREARM Performed at [facility], [place] [address] [place], [place] [address]    Special Requests   Final    BOTTLES DRAWN AEROBIC AND ANAEROBIC Blood Culture adequate volume Performed at [facility] [address], [place], [place] [address]    Culture   Setup Time   Final    GRAM POSITIVE COCCI IN PAIRS AND CHAINS ANAEROBIC BOTTLE ONLY CRITICAL RESULT CALLED TO, READ BACK BY AND VERIFIED WITH: Marcy Siren PharmD 11:45 04/10/18 ([email or web address]) Performed at [facility], [place] [address], [place], [place] [address]    Culture GROUP B STREP(S.AGALACTIAE)ISOLATED (A)  Final   Report Status 04/12/2018 FINAL  Final   Organism ID, Bacteria GROUP B STREP(S.AGALACTIAE)ISOLATED  Final      Susceptibility   Group b strep(s.agalactiae)isolated - MIC*    CLINDAMYCIN >=1 RESISTANT Resistant     AMPICILLIN <=0.25 SENSITIVE Sensitive     ERYTHROMYCIN >=8 RESISTANT Resistant     VANCOMYCIN 0.5 SENSITIVE Sensitive     CEFTRIAXONE <=0.12 SENSITIVE Sensitive     LEVOFLOXACIN 1 SENSITIVE Sensitive     * GROUP B STREP(S.AGALACTIAE)ISOLATED  Urine culture     Status: Abnormal   Collection Time: 04/09/18  6:28 PM  Result Value Ref Range Status   Specimen Description   Final    URINE, RANDOM Performed at [facility] [address] [place], [place] [address]    Special Requests   Final    URINE, RANDOM Performed at [facility] [address] [place], [place] [address]    Culture MULTIPLE SPECIES PRESENT, SUGGEST RECOLLECTION (A)  Final   Report Status 04/11/2018 FINAL  Final  Blood Culture ID Panel (Reflexed)     Status: Abnormal   Collection Time: 04/09/18  6:28 PM  Result Value Ref Range Status   Enterococcus species NOT DETECTED NOT DETECTED Final   Listeria monocytogenes NOT DETECTED NOT DETECTED Final   Staphylococcus species NOT DETECTED NOT DETECTED Final   Staphylococcus aureus NOT DETECTED NOT DETECTED Final   Streptococcus species DETECTED (A) NOT DETECTED Final    Comment: CRITICAL RESULT CALLED TO, READ BACK BY AND VERIFIED WITH: Marcy Siren PharmD 11:45 04/10/18 ([email or web address])    Streptococcus agalactiae DETECTED (A) NOT DETECTED Final    Comment: CRITICAL RESULT CALLED TO, READ BACK BY AND VERIFIED  WITH: Marcy Siren PharmD 11:45 04/10/18 ([email or web address])    Streptococcus pneumoniae NOT DETECTED NOT DETECTED Final   Streptococcus pyogenes NOT DETECTED NOT DETECTED Final   Acinetobacter baumannii NOT DETECTED NOT DETECTED Final   Enterobacteriaceae species NOT DETECTED NOT DETECTED Final   Enterobacter cloacae complex NOT DETECTED NOT DETECTED Final   Escherichia coli NOT DETECTED NOT DETECTED Final   Klebsiella oxytoca NOT DETECTED NOT DETECTED Final   Klebsiella pneumoniae NOT DETECTED NOT DETECTED Final   Proteus species NOT DETECTED NOT DETECTED Final   Serratia marcescens NOT DETECTED NOT DETECTED Final   Haemophilus influenzae NOT DETECTED NOT DETECTED Final   Neisseria meningitidis NOT DETECTED NOT DETECTED Final   Pseudomonas aeruginosa NOT DETECTED NOT DETECTED Final   Candida albicans NOT DETECTED NOT DETECTED Final   Candida glabrata NOT DETECTED NOT DETECTED Final   Candida krusei NOT DETECTED NOT DETECTED Final   Candida parapsilosis NOT DETECTED NOT DETECTED Final   Candida tropicalis NOT DETECTED NOT DETECTED Final    Comment: Performed at [facility], [place]. [address], [place], [place] [address]  Blood Culture (routine x 2)  Status: None (Preliminary result)   Collection Time: 04/09/18 11:33 PM  Result Value Ref Range Status   Specimen Description   Final    BLOOD RIGHT ANTECUBITAL Performed at [facility] [address], [place], [place] [address]    Special Requests   Final    BOTTLES DRAWN AEROBIC AND ANAEROBIC Blood Culture adequate volume Performed at [facility] [address], [place], [place] [address]    Culture   Final    NO GROWTH 3 DAYS Performed at [facility], [place] [address], [place], [place] [address]    Report Status PENDING  Incomplete  Surgical pcr screen     Status: Abnormal   Collection Time: 04/11/18  2:25 PM  Result Value Ref Range Status   MRSA, PCR POSITIVE (A) NEGATIVE Final     Comment: RESULT CALLED TO, READ BACK BY AND VERIFIED WITH: PAM MENTLEY,RN [id number] @ 1731 BY J SCOTTON    Staphylococcus aureus POSITIVE (A) NEGATIVE Final    Comment: (NOTE) The Xpert SA Assay (FDA approved for NASAL specimens in patients 15 years of age and older), is one component of a comprehensive surveillance program. It is not intended to diagnose infection nor to guide or monitor treatment. Performed at [facility], [place] [address] [place], [place] [address]   Aerobic/Anaerobic Culture (surgical/deep wound)     Status: None (Preliminary result)   Collection Time: 04/11/18  7:09 PM  Result Value Ref Range Status   Specimen Description   Final    WOUND FOOT RIGHT Performed at [facility] [address] [place], [place] [address]    Special Requests   Final    NONE Performed at [facility], [place] [address], [place], [place] [address]    Gram Stain   Final    MODERATE WBC PRESENT, PREDOMINANTLY PMN ABUNDANT GRAM POSITIVE COCCI Performed at [facility], [place] [address] [place], [place] [address]    Culture   Final    MODERATE GROUP B STREP(S.AGALACTIAE)ISOLATED TESTING AGAINST S. AGALACTIAE NOT ROUTINELY PERFORMED DUE TO PREDICTABILITY OF AMP/PEN/VAN SUSCEPTIBILITY. NO ANAEROBES ISOLATED; CULTURE IN PROGRESS FOR 5 DAYS    Report Status PENDING  Incomplete  Aerobic/Anaerobic Culture (surgical/deep wound)     Status: None (Preliminary result)   Collection Time: 04/11/18  7:38 PM  Result Value Ref Range Status   Specimen Description   Final    BONE FOOT RIGHT Performed at [facility] [address], [place], [place] [address]    Special Requests   Final    NONE Performed at [facility], [place] [address] [place], [place] [address]    Gram Stain   Final    RARE WBC PRESENT, PREDOMINANTLY PMN FEW GRAM POSITIVE COCCI Performed at [facility], [place] [address] [place], [place] [address]    Culture   Final    MODERATE GROUP B STREP(S.AGALACTIAE)ISOLATED TESTING AGAINST S. AGALACTIAE NOT ROUTINELY PERFORMED DUE TO PREDICTABILITY OF AMP/PEN/VAN SUSCEPTIBILITY. NO ANAEROBES ISOLATED; CULTURE IN PROGRESS FOR 5 DAYS    Report Status PENDING  Incomplete  Aerobic/Anaerobic Culture (surgical/deep wound)     Status: None (Preliminary result)   Collection Time: 04/11/18  7:41 PM  Result Value Ref Range Status   Specimen Description   Final    WOUND FOOT RIGHT Performed at [facility] [address], [place], [place] [address]    Special Requests  Final    NONE Performed at [facility], [place] [address], [place], [place] [address]    Gram Stain   Final    RARE WBC PRESENT, PREDOMINANTLY PMN NO ORGANISMS SEEN Performed at [facility], [place] [address], [place], [place] [address]    Culture   Final    FEW GROUP B STREP(S.AGALACTIAE)ISOLATED TESTING AGAINST S. AGALACTIAE NOT ROUTINELY PERFORMED DUE TO PREDICTABILITY OF AMP/PEN/VAN SUSCEPTIBILITY. NO ANAEROBES ISOLATED; CULTURE IN PROGRESS FOR 5 DAYS    Report Status PENDING  Incomplete  Culture, blood (routine x 2)     Status: None (Preliminary result)   Collection Time: 04/12/18 12:01 AM  Result Value Ref Range Status   Specimen Description   Final    BLOOD RIGHT ANTECUBITAL Performed at [facility] [address], [place], [place] [address]    Special Requests   Final    BOTTLES DRAWN AEROBIC AND ANAEROBIC Blood Culture adequate volume Performed at [facility] [address], [place], [place] [address]    Culture   Final    NO GROWTH 1 DAY Performed at [facility], [place] [address], [place], [place] [address]    Report Status PENDING  Incomplete  Culture, blood (routine x 2)     Status: None (Preliminary result)   Collection Time: 04/12/18 12:01 AM  Result Value Ref Range Status   Specimen  Description   Final    BLOOD LEFT HAND Performed at [facility] [address] [place], [place] [address]    Special Requests   Final    BOTTLES DRAWN AEROBIC AND ANAEROBIC Blood Culture adequate volume Performed at [facility] [address], [place], [place] [address]    Culture   Final    NO GROWTH 1 DAY Performed at [facility], [place] [address], [place], [place] [address]    Report Status PENDING  Incomplete         Radiology Studies: No results found.    Scheduled Meds: . Chlorhexidine Gluconate Cloth  6 each Topical Q0600  . docusate sodium  100 mg Oral BID  . [START ON 04/14/2018] enoxaparin (LOVENOX) injection  40 mg Subcutaneous Q24H  . gabapentin  600 mg Oral BID  . insulin aspart  0-15 Units Subcutaneous Q4H  . insulin glargine  36 Units Subcutaneous QAC breakfast  . mupirocin ointment  1 application Nasal BID  . nicotine  21 mg Transdermal Daily  . QUEtiapine  600 mg Oral QHS  . rOPINIRole  2 mg Oral QHS  . sodium chloride flush  3 mL Intravenous Q12H   Continuous Infusions: . sodium chloride 10 mL/hr at 04/13/18 1245  . ampicillin (OMNIPEN) IV Stopped (04/13/18 1444)  .  ceFAZolin (ANCEF) IV 1 g (04/13/18 1504)  . lactated ringers 10 mL/hr at 04/13/18 0843  . methocarbamol (ROBAXIN)  IV       LOS: 4 days    Time spent in minutes: 35 min    Debbe Odea, MD [facility] Pager: [email or web address] Password [facility] 04/13/2018, 3:17 PM

## 2018-04-13 NOTE — Anesthesia Procedure Notes (Signed)
Anesthesia Regional Block: Popliteal block   Pre-Anesthetic Checklist: ,, timeout performed, Correct Patient, Correct Site, Correct Laterality, Correct Procedure, Correct Position, site marked, Risks and benefits discussed,  Surgical consent,  Pre-op evaluation,  At surgeon's request and post-op pain management  Laterality: Right  Prep: chloraprep       Needles:  Injection technique: Single-shot  Needle Type: Echogenic Stimulator Needle          Additional Needles:   Procedures:,,,, ultrasound used (permanent image in chart),,,,  Narrative:  Start time: 04/13/2018 9:03 AM End time: 04/13/2018 9:11 AM Injection made incrementally with aspirations every 5 mL.  Performed by: Personally  Anesthesiologist: Oleta Mouse, MD  Additional Notes: H+P and labs reviewed, risks and benefits discussed with patient, procedure tolerated well without complications

## 2018-04-14 ENCOUNTER — Encounter (HOSPITAL_COMMUNITY): Payer: Self-pay | Admitting: Orthopedic Surgery

## 2018-04-14 LAB — BASIC METABOLIC PANEL
Anion gap: 8 (ref 5–15)
BUN: <5 mg/dL — ABNORMAL LOW (ref 6–20)
CHLORIDE: 105 mmol/L (ref 101–111)
CO2: 25 mmol/L (ref 22–32)
CREATININE: 0.6 mg/dL (ref 0.44–1.00)
Calcium: 8.5 mg/dL — ABNORMAL LOW (ref 8.9–10.3)
Glucose, Bld: 111 mg/dL — ABNORMAL HIGH (ref 65–99)
POTASSIUM: 4 mmol/L (ref 3.5–5.1)
Sodium: 138 mmol/L (ref 135–145)

## 2018-04-14 LAB — GLUCOSE, CAPILLARY
GLUCOSE-CAPILLARY: 113 mg/dL — AB (ref 65–99)
GLUCOSE-CAPILLARY: 157 mg/dL — AB (ref 65–99)
GLUCOSE-CAPILLARY: 189 mg/dL — AB (ref 65–99)
GLUCOSE-CAPILLARY: 67 mg/dL (ref 65–99)
Glucose-Capillary: 73 mg/dL (ref 65–99)
Glucose-Capillary: 197 mg/dL — ABNORMAL HIGH (ref 65–99)

## 2018-04-14 LAB — CBC
HCT: 33.4 % — ABNORMAL LOW (ref 36.0–46.0)
HEMOGLOBIN: 10.6 g/dL — AB (ref 12.0–15.0)
MCH: 30.5 pg (ref 26.0–34.0)
MCHC: 31.7 g/dL (ref 30.0–36.0)
MCV: 96.3 fL (ref 78.0–100.0)
PLATELETS: 304 10*3/uL (ref 150–400)
RBC: 3.47 MIL/uL — AB (ref 3.87–5.11)
RDW: 14.8 % (ref 11.5–15.5)
WBC: 12.4 10*3/uL — ABNORMAL HIGH (ref 4.0–10.5)

## 2018-04-14 MED ORDER — HYDROMORPHONE HCL 2 MG/ML IJ SOLN
0.5000 mg | INTRAMUSCULAR | Status: DC | PRN
  Administered 2018-04-14 – 2018-04-15 (×2): 1 mg via INTRAVENOUS
  Filled 2018-04-14 (×2): qty 1

## 2018-04-14 MED ORDER — DEXTROSE 50 % IV SOLN
INTRAVENOUS | Status: AC
  Administered 2018-04-14: 05:00:00
  Filled 2018-04-14: qty 50

## 2018-04-14 MED ORDER — INSULIN GLARGINE 100 UNIT/ML ~~LOC~~ SOLN
24.0000 [IU] | Freq: Every day | SUBCUTANEOUS | Status: DC
  Administered 2018-04-15 – 2018-04-16 (×2): 24 [IU] via SUBCUTANEOUS
  Filled 2018-04-14 (×3): qty 0.24

## 2018-04-14 NOTE — Progress Notes (Signed)
Patient ID: NOVICE VRBA, female   DOB: [birth year], 52 y.o.   MRN: [id number] Patient is postoperative day 1 transtibial amputation.  Examination the wound VAC is not turned on there is no drainage in the wound VAC.  The wound VAC was functioning well when discharged from the operating room.  We will have a new wound VAC pump brought to the floor discussed my concern for patient's wound VAC pump not being maintained with a risk of wound maceration or wound breakdown.  Anticipate patient will need either inpatient or outpatient rehabilitation.  Patient has the stump protector from [employer]otech.

## 2018-04-14 NOTE — Evaluation (Addendum)
Physical Therapy Evaluation Patient Details Name: [name] MRN: [id number] DOB: 18-Sep-1966 Today's Date: 04/14/2018   History of Present Illness  [name] is a 52 y.o.femalewith a known history of DM, COPD, GERD, OSA , CVA, HLD and insomniapresents to the emergency department for evaluation of AMS. Patient was in a usual state of health until two days ago when she became confused & weak. Husband states that she has had worsening of a wound on the bottom of her right foot associated with Charcot deformity and is scheduled to see podiatry for procedure on 4/22. States right lower extremity is red, swollen and wound is draining. He initially attributed her AMS to a new sleeping pill for her insomnia. In the [place] long ED the patient was awake, arousable but did not answer questions appropriately. 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       Clinical Impression  Patient is s/p above surgery resulting in functional limitations due to the deficits listed below (see PT Problem List). PTA, pt living with husband in 1 story home with ramped entrance, independent with all mobility. Upon eval pt presents with post op pain and weakness. Currently min guard level for mobility, progressing to supervision during gait training today. Ambulating hallway with rest breaks. Reviewed therex to reduce risk of contracture, and discussed receomended level of assistance at home to maximize safety as patient had near fall this visit (see general transfer comments below). Of note: Husband reports he needs several days to get house prepared and get ready for care taking but will be available mid-late next week to provide support for out of bed mobility. If PT is d/cing sooner than this may need to consider short term rehab before home d/c as she will be safer at home with his support.  Patient will benefit from skilled PT to increase their independence and safety with mobility to allow discharge to the venue listed below.        Follow Up Recommendations Home health PT;Supervision/Assistance - 24 hour    Equipment Recommendations  3in1 (PT);Rolling walker with 5" wheels(Rollator )    Recommendations for Other Services OT consult     Precautions / Restrictions Precautions Precautions: Fall Restrictions Weight Bearing Restrictions: Yes RLE Weight Bearing: Non weight bearing      Mobility  Bed Mobility               General bed mobility comments: OOB at entry, pt reports she has been transfering to bed and chair independently  Transfers Overall transfer level: Needs assistance Equipment used: Rolling walker (2 wheeled) Transfers: Sit to/from Stand Sit to Stand: Supervision         General transfer comment: Supervision for sit<>stand, cues for use of RW. of note: as patient was transfering back into her bed her L leg fatigued and she prematurly sat, sliding off bed and into therapists arms. able to corret and re-direct onto EOB with mod A. Pt reports her residual limb contacted floor at bottom of motion, increase from 5/10 to 7/10 pain, notified RN and inspected residual limb no signs of injury. Pt SpO2 was 90% on RA.   Ambulation/Gait Ambulation/Gait assistance: Min guard;Supervision Ambulation Distance (Feet): 45 Feet Assistive device: Rolling walker (2 wheeled) Gait Pattern/deviations: Step-to pattern Gait velocity: decreased   General Gait Details: Patient ambulating with RW with hop to gait pattern. pt fatigues quickly and requires a chair follow. rest break x3 Min guard intitally, progresed to supervision.   Stairs  Wheelchair Mobility    Modified Rankin (Stroke Patients Only)       Balance Overall balance assessment: Needs assistance   Sitting balance-Leahy Scale: Good       Standing balance-Leahy Scale: Fair Standing balance comment: Reliant on RW for balance                              Pertinent Vitals/Pain Pain Assessment: 0-10 Pain  Score: 5  Pain Location: R amuptation site Pain Descriptors / Indicators: Aching;Discomfort Pain Intervention(s): Limited activity within patient's tolerance;Monitored during session;Premedicated before session;Repositioned    Home Living Family/patient expects to be discharged to:: Private residence Living Arrangements: Spouse/significant other Available Help at Discharge: Family Type of Home: House Home Access: Stairs to enter;Ramped entrance Entrance Stairs-Rails: None   Home Layout: One level Home Equipment: Environmental consultant - 4 wheels      Prior Function Level of Independence: Independent         Comments: per husband, pt was independent with mobility prior to admission     Hand Dominance        Extremity/Trunk Assessment   Upper Extremity Assessment Upper Extremity Assessment: Overall WFL for tasks assessed    Lower Extremity Assessment Lower Extremity Assessment: RLE deficits/detail(LLE 5/5 strength, RLE BKA)       Communication   Communication: No difficulties  Cognition Arousal/Alertness: Awake/alert Behavior During Therapy: WFL for tasks assessed/performed Overall Cognitive Status: Within Functional Limits for tasks assessed                                        General Comments      Exercises Amputee Exercises Hip Extension: 10 reps Knee Extension: 10 reps   Assessment/Plan    PT Assessment Patient needs continued PT services  PT Problem List Decreased balance;Decreased mobility;Decreased activity tolerance;Pain       PT Treatment Interventions DME instruction;Gait training;Functional mobility training;Therapeutic activities;Balance training;Patient/family education;Therapeutic exercise    PT Goals (Current goals can be found in the Care Plan section)  Acute Rehab PT Goals Patient Stated Goal: none stated PT Goal Formulation: With family Time For Goal Achievement: 04/24/18 Potential to Achieve Goals: Good    Frequency Min  3X/week   Barriers to discharge        Co-evaluation               AM-PAC PT "6 Clicks" Daily Activity  Outcome Measure Difficulty turning over in bed (including adjusting bedclothes, sheets and blankets)?: A Little Difficulty moving from lying on back to sitting on the side of the bed? : A Little Difficulty sitting down on and standing up from a chair with arms (e.g., wheelchair, bedside commode, etc,.)?: A Little Help needed moving to and from a bed to chair (including a wheelchair)?: A Little Help needed walking in hospital room?: A Little Help needed climbing 3-5 steps with a railing? : A Little 6 Click Score: 18    End of Session Equipment Utilized During Treatment: Gait belt;Oxygen Activity Tolerance: Patient tolerated treatment well Patient left: in bed;with call bell/phone within reach;with bed alarm set;with family/visitor present Nurse Communication: Mobility status PT Visit Diagnosis: Pain;Difficulty in walking, not elsewhere classified (R26.2);Other abnormalities of gait and mobility (R26.89) Pain - Right/Left: Right Pain - part of body: Knee    Time: 1433-1500 PT Time Calculation (min) (ACUTE ONLY): 27 min  Charges:   PT Evaluation $PT Eval Low Complexity: 1 Low PT Treatments $Gait Training: 8-22 mins   PT G Codes:        Reinaldo Berber, PT, DPT Acute Rehab Services Pager: [phone]    Reinaldo Berber 04/14/2018, 6:11 PM

## 2018-04-14 NOTE — Progress Notes (Signed)
patient refused insulin and lantus at 8am  due to blood sugar being 73 this morning and receiving D5, currently 157 will continue to monitor. Asymptomatic at this time

## 2018-04-14 NOTE — Progress Notes (Signed)
Dr. Sharol Given  Notified of patient working with PT, got fatigued at end of treatment was transferring back to bed and left leg became fatigued and patient slide with assistance from PT. Patient assisted back to bed. Assessed by RN, CN notified. Patient was not injured. Stump cover was in place at time and [facility] intacted.

## 2018-04-14 NOTE — Progress Notes (Signed)
PROGRESS NOTE    [name]   HAL:[id number]  DOB: January 15, 1966  DOA: 04/09/2018 PCP: Tamsen Roers, MD   Brief Narrative:  [name]  is a 52 y.o. female with a known history of DM, COPD, GERD, OSA , CVA, HLD and insomnia presents to the emergency department for evaluation of AMS.  Patient was in a usual state of health until two days ago when she became confused & weak.  Husband states that she has had worsening of a wound on the bottom of her right foot associated with Charcot deformity and is scheduled to see podiatry for procedure on 4/22.  States right lower extremity is red, swollen and wound is draining.  He initially attributed her AMS to a new sleeping pill for her insomnia.  In the [facility] long ED the patient was awake, arousable but did not answer questions appropriately.  Her podiatrist, Dr March Rummage was consulted and she was taken for an I and D on 4/17. Was febrile up to 101.4 at midnight. Noted to have blood cultures post for Strep Agalactiae.  Dr March Rummage recommended an ortho consult on 3/18 for a BKA. 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   Subjective: She denies any nausea, vomiting, constipation or diarrhea, no chest pain or shortness of breath.  Assessment & Plan:    Severe sepsis/ strep agalactiae wound infection and subsequent bacteremia  Cutaneous abscess of right foot  Subacute osteomyelitis, right ankle and foot  - I&D on 4/17. Was febrile up to 101.4 at midnight. Noted to have blood cultures post for Strep Agalactiae. ,Dr March Rummage recommended an ortho consult on 4/18 for a BKA. She was transferred to [facility] and underwent right BKA by Dr Sharol Given On 04/19/202. - Dr Baxter Flattery originally recommended 4 wks of Ampicillin, however, as an amputation has been done, 10-14 days to cover for bacteremia might be sufficient- have asked ID for new recommendations -dr. Wynelle Cleveland have spoken with Dr Baxter Flattery  ID recommends 10 days of antibiotics after neg blood  cultures - can switch to Amoxicillin on d/C. - repeat blood cultures 04/12/2018 remains negative to date, 2-D echo with no evidence of vegetation. - orthopedic input greatly appreciated, regarding follow-up today they recommend new 1 vac pump, patient has stump protector from [employer]. - she will also go with a Prevena plus portable wound vac from the hospital and will need to f/u 1 wk after the surgery  Cigarette smoker/ COPD - nicotine patch- needs to stop smoking - Albutero, Atrovent l PRN  DM2 uncontrolled with neuropathy-  - A1c 9.2 on 3/19, She had hypoglycemia this a.m., so I have lowered her Lantus from 36-24 units. Vania Rea and Invokana on hold - cont Neurontin   Major depressive disorder, recurrent, mild Polypharmacy  - numerous home meds on hold   - psych evaluated  the patient on 4/17 and recommended to start Celexa 10 mg daily and Hydroxyzine 25 mg TID PRN for anxiety    Hypokalemia - replaced   vaginal rash , - fungal, on Nystatin.will give Diflucan once  DVT prophylaxis: Lovenox Code Status: Full code Family Communication: husband  Disposition Plan: awaiting PT eval- may be able to go home tomorrow if she gets the stump protector Consultants:   Podiatry  Ortho  ID Procedures:   Right foot I and D  Right BKA Antimicrobials:  Anti-infectives (From admission, onward)   Start     Dose/Rate Route Frequency Ordered Stop  04/13/18 1500  ceFAZolin (ANCEF) IVPB 1 g/50 mL premix     1 g 100 mL/hr over 30 Minutes Intravenous Every 6 hours 04/13/18 1230 04/14/18 0444   04/13/18 0815  ceFAZolin (ANCEF) IVPB 2g/100 mL premix     2 g 200 mL/hr over 30 Minutes Intravenous On call to O.R. 04/13/18 0804 04/13/18 0915   04/12/18 1400  ampicillin (OMNIPEN) 2 g in sodium chloride 0.9 % 100 mL IVPB     2 g 300 mL/hr over 20 Minutes Intravenous Every 4 hours 04/12/18 1250     04/12/18 0200  vancomycin (VANCOCIN) 1,500 mg in sodium chloride 0.9 % 500 mL IVPB  Status:   Discontinued     1,500 mg 250 mL/hr over 120 Minutes Intravenous Daily 04/12/18 0143 04/12/18 1023   04/11/18 1845  vancomycin (VANCOCIN) powder 500 mg     500 mg Other To Surgery 04/11/18 1837 04/11/18 1916   04/11/18 1600  cefTRIAXone (ROCEPHIN) 2 g in sodium chloride 0.9 % 100 mL IVPB  Status:  Discontinued     2 g 200 mL/hr over 30 Minutes Intravenous Every 24 hours 04/11/18 1102 04/12/18 1246   04/11/18 1600  metroNIDAZOLE (FLAGYL) IVPB 500 mg  Status:  Discontinued     500 mg 100 mL/hr over 60 Minutes Intravenous Every 8 hours 04/11/18 1102 04/12/18 1246   04/10/18 2000  vancomycin (VANCOCIN) 1,500 mg in sodium chloride 0.9 % 500 mL IVPB  Status:  Discontinued     1,500 mg 250 mL/hr over 120 Minutes Intravenous Every 24 hours 04/09/18 2121 04/11/18 1102   04/09/18 2359  piperacillin-tazobactam (ZOSYN) IVPB 3.375 g     3.375 g 12.5 mL/hr over 240 Minutes Intravenous Every 8 hours 04/09/18 2121 04/11/18 1130   04/09/18 2315  piperacillin-tazobactam (ZOSYN) IVPB 3.375 g  Status:  Discontinued     3.375 g 100 mL/hr over 30 Minutes Intravenous  Once 04/09/18 2311 04/09/18 2316   04/09/18 2315  vancomycin (VANCOCIN) IVPB 1000 mg/200 mL premix  Status:  Discontinued     1,000 mg 200 mL/hr over 60 Minutes Intravenous  Once 04/09/18 2311 04/09/18 2317   04/09/18 1815  vancomycin (VANCOCIN) 2,000 mg in sodium chloride 0.9 % 500 mL IVPB     2,000 mg 250 mL/hr over 120 Minutes Intravenous STAT 04/09/18 1813 04/09/18 2150   04/09/18 1800  piperacillin-tazobactam (ZOSYN) IVPB 3.375 g     3.375 g 100 mL/hr over 30 Minutes Intravenous  Once 04/09/18 1757 04/09/18 1932   04/09/18 1800  vancomycin (VANCOCIN) IVPB 1000 mg/200 mL premix  Status:  Discontinued     1,000 mg 200 mL/hr over 60 Minutes Intravenous  Once 04/09/18 1757 04/09/18 1813       Objective: Vitals:   04/13/18 2014 04/13/18 2300 04/14/18 0045 04/14/18 0429  BP: 133/70 (!) 96/53 (!) 91/53 (!) 114/58  Pulse: 85 72 70 84    Resp: 20 18    Temp: 98.6 F (37 C) 98.8 F (37.1 C) 98.9 F (37.2 C) 98.8 F (37.1 C)  TempSrc: Oral Axillary Oral Axillary  SpO2: 98% 91%  90%  Weight:      Height:        Intake/Output Summary (Last 24 hours) at 04/14/2018 1220 Last data filed at 04/14/2018 0432 Gross per 24 hour  Intake 975.16 ml  Output 2600 ml  Net -1624.84 ml   Filed Weights   04/09/18 2251 04/11/18 1700 04/13/18 0820  Weight: 96.2 kg (212 lb 1.3 oz) 96 kg (  211 lb 10.3 oz) 96 kg (211 lb 10.3 oz)    Examination: General exam:wake alert 3, laying in bed in no apparent distress Respiratory system: good air entry bilaterally, clear to auscultation Cardiovascular system: S1 & S2 heard, RRR.   Gastrointestinal system: Abdomen soft, non-tender, nondistended. Normal bowel sound. No organomegaly Central nervous system: Alert and oriented. No focal neurological deficits. Extremities:  right BKAwith stump protector Skin: No rashes or ulcers Psychiatry:  Mood & affect appropriate.     Data Reviewed: I have personally reviewed following labs and imaging studies  CBC: Recent Labs  Lab 04/09/18 1828 04/10/18 0542 04/12/18 0553 04/13/18 0555 04/14/18 0708  WBC 30.3* 22.8* 12.6* 13.7* 12.4*  NEUTROABS 28.2*  --   --   --   --   HGB 13.0 10.5* 10.1* 10.8* 10.6*  HCT 39.1 32.0* 30.8* 34.4* 33.4*  MCV 95.1 96.1 96.0 96.9 96.3  PLT 261 221 232 293 867   Basic Metabolic Panel: Recent Labs  Lab 04/09/18 1828 04/10/18 0542 04/11/18 0553 04/12/18 0553 04/13/18 0555 04/14/18 0708  NA 131* 132*  --  138 140 138  K 4.6 3.8  --  3.2* 3.3* 4.0  CL 90* 97*  --  106 107 105  CO2 25 25  --  23 24 25   GLUCOSE 385* 166*  --  161* 116* 111*  BUN 15 14  --  6 7 <5*  CREATININE 1.05* 0.84 0.64 0.58 0.58 0.60  CALCIUM 9.5 8.1*  --  8.2* 8.4* 8.5*  MG  --   --   --  1.7 1.8  --    GFR: Estimated Creatinine Clearance: 94.3 mL/min (by C-G formula based on SCr of 0.6 mg/dL). Liver Function Tests: Recent Labs   Lab 04/09/18 1828 04/10/18 0542  AST 16 10*  ALT 16 11*  ALKPHOS 110 87  BILITOT 0.3 0.5  PROT 7.6 6.0*  ALBUMIN 3.3* 2.4*   No results for input(s): LIPASE, AMYLASE in the last 168 hours. No results for input(s): AMMONIA in the last 168 hours. Coagulation Profile: No results for input(s): INR, PROTIME in the last 168 hours. Cardiac Enzymes: No results for input(s): CKTOTAL, CKMB, CKMBINDEX, TROPONINI in the last 168 hours. BNP (last 3 results) No results for input(s): PROBNP in the last 8760 hours. HbA1C: No results for input(s): HGBA1C in the last 72 hours. CBG: Recent Labs  Lab 04/14/18 0041 04/14/18 0417 04/14/18 0442 04/14/18 0757 04/14/18 1119  GLUCAP 113* 67 73 157* 197*   Lipid Profile: Recent Labs    04/12/18 0553  CHOL 76  HDL 15*  LDLCALC 37  TRIG 122  CHOLHDL 5.1   Thyroid Function Tests: No results for input(s): TSH, T4TOTAL, FREET4, T3FREE, THYROIDAB in the last 72 hours. Anemia Panel: No results for input(s): VITAMINB12, FOLATE, FERRITIN, TIBC, IRON, RETICCTPCT in the last 72 hours. Urine analysis:    Component Value Date/Time   COLORURINE YELLOW 04/09/2018 1828   APPEARANCEUR HAZY (A) 04/09/2018 1828   LABSPEC 1.022 04/09/2018 1828   PHURINE 6.0 04/09/2018 1828   GLUCOSEU >=500 (A) 04/09/2018 1828   GLUCOSEU >=1000 (A) 01/07/2015 1410   HGBUR NEGATIVE 04/09/2018 1828   BILIRUBINUR NEGATIVE 04/09/2018 1828   KETONESUR NEGATIVE 04/09/2018 1828   PROTEINUR NEGATIVE 04/09/2018 1828   UROBILINOGEN 0.2 04/23/2015 2355   NITRITE NEGATIVE 04/09/2018 1828   LEUKOCYTESUR SMALL (A) 04/09/2018 1828   Sepsis Labs: @LABRCNTIP (procalcitonin:4,lacticidven:4) ) Recent Results (from the past 240 hour(s))  Blood Culture (routine x 2)  Status: Abnormal   Collection Time: 04/09/18  6:28 PM  Result Value Ref Range Status   Specimen Description   Final    BLOOD BLOOD LEFT FOREARM Performed at [facility] [address]  [place], [place] [address]    Special Requests   Final    BOTTLES DRAWN AEROBIC AND ANAEROBIC Blood Culture adequate volume Performed at [facility] [address], [place], [place] [address]    Culture  Setup Time   Final    GRAM POSITIVE COCCI IN PAIRS AND CHAINS ANAEROBIC BOTTLE ONLY CRITICAL RESULT CALLED TO, READ BACK BY AND VERIFIED WITH: Marcy Siren PharmD 11:45 04/10/18 ([email or web address]) Performed at [facility], [place] [address], [place], [place] [address]    Culture GROUP B STREP(S.AGALACTIAE)ISOLATED (A)  Final   Report Status 04/12/2018 FINAL  Final   Organism ID, Bacteria GROUP B STREP(S.AGALACTIAE)ISOLATED  Final      Susceptibility   Group b strep(s.agalactiae)isolated - MIC*    CLINDAMYCIN >=1 RESISTANT Resistant     AMPICILLIN <=0.25 SENSITIVE Sensitive     ERYTHROMYCIN >=8 RESISTANT Resistant     VANCOMYCIN 0.5 SENSITIVE Sensitive     CEFTRIAXONE <=0.12 SENSITIVE Sensitive     LEVOFLOXACIN 1 SENSITIVE Sensitive     * GROUP B STREP(S.AGALACTIAE)ISOLATED  Urine culture     Status: Abnormal   Collection Time: 04/09/18  6:28 PM  Result Value Ref Range Status   Specimen Description   Final    URINE, RANDOM Performed at [facility] [address], [place], [place] [address]    Special Requests   Final    URINE, RANDOM Performed at [facility] [address], [place], [place] [address]    Culture MULTIPLE SPECIES PRESENT, SUGGEST RECOLLECTION (A)  Final   Report Status 04/11/2018 FINAL  Final  Blood Culture ID Panel (Reflexed)     Status: Abnormal   Collection Time: 04/09/18  6:28 PM  Result Value Ref Range Status   Enterococcus species NOT DETECTED NOT DETECTED Final   Listeria monocytogenes NOT DETECTED NOT DETECTED Final   Staphylococcus species NOT DETECTED NOT DETECTED Final   Staphylococcus aureus NOT DETECTED NOT DETECTED Final   Streptococcus species DETECTED (A) NOT DETECTED Final    Comment: CRITICAL  RESULT CALLED TO, READ BACK BY AND VERIFIED WITH: Marcy Siren PharmD 11:45 04/10/18 ([email or web address])    Streptococcus agalactiae DETECTED (A) NOT DETECTED Final    Comment: CRITICAL RESULT CALLED TO, READ BACK BY AND VERIFIED WITH: Marcy Siren PharmD 11:45 04/10/18 ([email or web address])    Streptococcus pneumoniae NOT DETECTED NOT DETECTED Final   Streptococcus pyogenes NOT DETECTED NOT DETECTED Final   Acinetobacter baumannii NOT DETECTED NOT DETECTED Final   Enterobacteriaceae species NOT DETECTED NOT DETECTED Final   Enterobacter cloacae complex NOT DETECTED NOT DETECTED Final   Escherichia coli NOT DETECTED NOT DETECTED Final   Klebsiella oxytoca NOT DETECTED NOT DETECTED Final   Klebsiella pneumoniae NOT DETECTED NOT DETECTED Final   Proteus species NOT DETECTED NOT DETECTED Final   Serratia marcescens NOT DETECTED NOT DETECTED Final   Haemophilus influenzae NOT DETECTED NOT DETECTED Final   Neisseria meningitidis NOT DETECTED NOT DETECTED Final   Pseudomonas aeruginosa NOT DETECTED NOT DETECTED Final   Candida albicans NOT DETECTED NOT DETECTED Final   Candida glabrata NOT DETECTED NOT DETECTED Final   Candida krusei NOT DETECTED NOT DETECTED Final   Candida parapsilosis NOT DETECTED NOT DETECTED Final   Candida tropicalis  NOT DETECTED NOT DETECTED Final    Comment: Performed at [facility], [place] [address], [place], [place] [address]  Blood Culture (routine x 2)     Status: None (Preliminary result)   Collection Time: 04/09/18 11:33 PM  Result Value Ref Range Status   Specimen Description   Final    BLOOD RIGHT ANTECUBITAL Performed at [facility] [address], [place], [place] [address]    Special Requests   Final    BOTTLES DRAWN AEROBIC AND ANAEROBIC Blood Culture adequate volume Performed at [facility] [address], [place], [place] [address]    Culture   Final    NO GROWTH 3 DAYS Performed at [facility], [place] [address],  [place], [place] [address]    Report Status PENDING  Incomplete  Surgical pcr screen     Status: Abnormal   Collection Time: 04/11/18  2:25 PM  Result Value Ref Range Status   MRSA, PCR POSITIVE (A) NEGATIVE Final    Comment: RESULT CALLED TO, READ BACK BY AND VERIFIED WITH: PAM MENTLEY,RN [id number] @ 1731 BY J SCOTTON    Staphylococcus aureus POSITIVE (A) NEGATIVE Final    Comment: (NOTE) The Xpert SA Assay (FDA approved for NASAL specimens in patients 74 years of age and older), is one component of a comprehensive surveillance program. It is not intended to diagnose infection nor to guide or monitor treatment. Performed at [facility], [place] [address] [place], [place] [address]   Aerobic/Anaerobic Culture (surgical/deep wound)     Status: None (Preliminary result)   Collection Time: 04/11/18  7:09 PM  Result Value Ref Range Status   Specimen Description   Final    WOUND FOOT RIGHT Performed at [facility] [address] [place], [place] [address]    Special Requests   Final    NONE Performed at [facility], [place] [address], [place], [place] [address]    Gram Stain   Final    MODERATE WBC PRESENT, PREDOMINANTLY PMN ABUNDANT GRAM POSITIVE COCCI Performed at [facility], [place] [address], [place], [place] [address]    Culture   Final    MODERATE GROUP B STREP(S.AGALACTIAE)ISOLATED TESTING AGAINST S. AGALACTIAE NOT ROUTINELY PERFORMED DUE TO PREDICTABILITY OF AMP/PEN/VAN SUSCEPTIBILITY. NO ANAEROBES ISOLATED; CULTURE IN PROGRESS FOR 5 DAYS    Report Status PENDING  Incomplete  Aerobic/Anaerobic Culture (surgical/deep wound)     Status: None (Preliminary result)   Collection Time: 04/11/18  7:38 PM  Result Value Ref Range Status   Specimen Description   Final    BONE FOOT RIGHT Performed at [facility] [address] [place], [place] [address]    Special Requests   Final    NONE Performed at  [facility], [place] [address], [place],  [address]    Gram Stain   Final    RARE WBC PRESENT, PREDOMINANTLY PMN FEW GRAM POSITIVE COCCI Performed at [facility], [place] [address], [place],  [address]    Culture   Final    MODERATE GROUP B STREP(S.AGALACTIAE)ISOLATED TESTING AGAINST S. AGALACTIAE NOT ROUTINELY PERFORMED DUE TO PREDICTABILITY OF AMP/PEN/VAN SUSCEPTIBILITY. NO ANAEROBES ISOLATED; CULTURE IN PROGRESS FOR 5 DAYS    Report Status PENDING  Incomplete  Aerobic/Anaerobic Culture (surgical/deep wound)     Status: None (Preliminary result)   Collection Time: 04/11/18  7:41 PM  Result Value Ref Range  Status   Specimen Description   Final    WOUND FOOT RIGHT Performed at [facility] [address] [place], [place] [address]    Special Requests   Final    NONE Performed at [facility], [place] [address] [place], [place] [address]    Gram Stain   Final    RARE WBC PRESENT, PREDOMINANTLY PMN NO ORGANISMS SEEN Performed at [facility], [place] [address] [place], [place] [address]    Culture   Final    FEW GROUP B STREP(S.AGALACTIAE)ISOLATED TESTING AGAINST S. AGALACTIAE NOT ROUTINELY PERFORMED DUE TO PREDICTABILITY OF AMP/PEN/VAN SUSCEPTIBILITY. NO ANAEROBES ISOLATED; CULTURE IN PROGRESS FOR 5 DAYS    Report Status PENDING  Incomplete  Culture, blood (routine x 2)     Status: None (Preliminary result)   Collection Time: 04/12/18 12:01 AM  Result Value Ref Range Status   Specimen Description   Final    BLOOD RIGHT ANTECUBITAL Performed at [facility] [address], [place], [place] [address]    Special Requests   Final    BOTTLES DRAWN AEROBIC AND ANAEROBIC Blood Culture adequate volume Performed at [facility] [address], [place], [place] [address]    Culture   Final    NO GROWTH 1 DAY Performed at [facility], [place] [address]  [place], [place] [address]    Report Status PENDING  Incomplete  Culture, blood (routine x 2)     Status: None (Preliminary result)   Collection Time: 04/12/18 12:01 AM  Result Value Ref Range Status   Specimen Description   Final    BLOOD LEFT HAND Performed at [facility] [address], [place], [place] [address]    Special Requests   Final    BOTTLES DRAWN AEROBIC AND ANAEROBIC Blood Culture adequate volume Performed at [facility] [address], [place],  [address]    Culture   Final    NO GROWTH 1 DAY Performed at [facility], [place] [address], [place],  [address]    Report Status PENDING  Incomplete         Radiology Studies: No results found.    Scheduled Meds: . Chlorhexidine Gluconate Cloth  6 each Topical Q0600  . docusate sodium  100 mg Oral BID  . enoxaparin (LOVENOX) injection  40 mg Subcutaneous Q24H  . gabapentin  600 mg Oral BID  . insulin aspart  0-15 Units Subcutaneous Q4H  . insulin glargine  36 Units Subcutaneous QAC breakfast  . mupirocin ointment  1 application Nasal BID  . nicotine  21 mg Transdermal Daily  . nystatin cream   Topical BID  . QUEtiapine  600 mg Oral QHS  . rOPINIRole  2 mg Oral QHS  . sodium chloride flush  3 mL Intravenous Q12H   Continuous Infusions: . sodium chloride 10 mL/hr at 04/13/18 1245  . ampicillin (OMNIPEN) IV 2 g (04/14/18 0928)  . lactated ringers Stopped (04/13/18 1244)  . methocarbamol (ROBAXIN)  IV       LOS: 5 days    Time spent in minutes: 25 min    Phillips Climes, MD [facility] [phone] [email or web address] Password [email or web address] 04/14/2018, 12:20 PM

## 2018-04-15 DIAGNOSIS — R7881 Bacteremia: Secondary | ICD-10-CM

## 2018-04-15 LAB — CULTURE, BLOOD (ROUTINE X 2)
Culture: NO GROWTH
Special Requests: ADEQUATE

## 2018-04-15 LAB — GLUCOSE, CAPILLARY
GLUCOSE-CAPILLARY: 283 mg/dL — AB (ref 65–99)
Glucose-Capillary: 180 mg/dL — ABNORMAL HIGH (ref 65–99)
Glucose-Capillary: 149 mg/dL — ABNORMAL HIGH (ref 65–99)
Glucose-Capillary: 139 mg/dL — ABNORMAL HIGH (ref 65–99)
Glucose-Capillary: 202 mg/dL — ABNORMAL HIGH (ref 65–99)

## 2018-04-15 MED ORDER — FLUCONAZOLE 150 MG PO TABS
150.0000 mg | ORAL_TABLET | Freq: Once | ORAL | Status: AC
Start: 2018-04-15 — End: 2018-04-15
  Administered 2018-04-15: 150 mg via ORAL
  Filled 2018-04-15: qty 1

## 2018-04-15 NOTE — Progress Notes (Signed)
Patient ID: [name], female   DOB: November 09, 1966, 52 y.o.   MRN: [id number] No acute changes post-op after her right BKA.  Significant pain with any attempts at mobility.  She is a candidate for further rehab post her acute hospital stay whether it is inpatient rehab vs SNF.

## 2018-04-15 NOTE — Progress Notes (Signed)
Occupational Therapy Evaluation Patient Details Name: [name] MRN: [id number] DOB: Jan 20, 1966 Today's Date: 04/15/2018  Clinical Impression: PTA Pt independent in ADL and mobility. Pt is currently min A for SPT to BSC/recliner and will require +2 support for further mobility with RW/chair follow. Pt is max A for LB ADL and set up for UB ADL. Pt with great arm strength and able to demonstrate and maintain chair push up for donning underwear in the recliner. Pt with cognitive deficits questionable if related to medication at this time impacting decision making and safety awareness. Pt will benefit from skilled OT in the acute setting as well as short term stay at CIR level therapy to maximize safety and independence and continue education for Pt and spouse due to new BKA. Next session to focus on compensatory strategies for LB ADL/balance and improved transfers - as well as reassess cognition (hopeful for improvement).    04/15/18 0900  OT Visit Information  Last OT Received On 04/15/18  Assistance Needed +2 (chair follow)  History of Present Illness [name] is a 52 y.o.femalewith a known history of DM, COPD, GERD, OSA , CVA, HLD and insomniapresented to the ED for evaluation of AMS. 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     Precautions  Precautions Fall  Restrictions  Weight Bearing Restrictions Yes  RLE Weight Bearing NWB  Home Living  Family/patient expects to be discharged to: Private residence  Living Arrangements Spouse/significant other  Available Help at Discharge Family  Type of [place] entrance;Stairs to enter  [place] One level  Bathroom Shower/Tub Tub/shower unit  Corporate treasurer No  [place] - 4 wheels  Prior Function  Level of Independence Independent  Comments per husband, pt was independent with mobility prior to admission  Communication  Communication No  difficulties  Pain Assessment  Pain Assessment 0-10  Pain Score 8  Pain Location residual limb at incision  Pain Descriptors / Indicators Aching;Discomfort;Burning;Sore  Pain Intervention(s) Monitored during session;Repositioned;Premedicated before session  Cognition  Arousal/Alertness Suspect due to medications  Behavior During Therapy [facility] for tasks assessed/performed  Overall Cognitive Status Impaired/Different from baseline  Area of Impairment Orientation;Attention;Safety/judgement;Problem solving  Orientation Level Disoriented to;Time  Current Attention Level Sustained  Memory Decreased recall of precautions;Decreased short-term memory  Safety/Judgement Decreased awareness of safety;Decreased awareness of deficits  Problem Solving Difficulty sequencing;Requires verbal cues  General Comments Pt drowsy throughout session, will awaken to her name or activity but shortly falls back asleep. Does not recall who worked with her yesterday or how to don residual limb protector.  Upper Extremity Assessment  Upper Extremity Assessment Overall WFL for tasks assessed (strong! )  Lower Extremity Assessment  Lower Extremity Assessment RLE deficits/detail;LLE deficits/detail  RLE Deficits / Details new BKA - residual limb protector used during session  RLE Coordination decreased gross motor  LLE Deficits / Details dressing on foot; history of multiple knee sx  LLE Sensation history of peripheral neuropathy  Cervical / Trunk Assessment  Cervical / Trunk Assessment Normal  ADL  Overall ADL's  Needs assistance/impaired  Eating/Feeding Modified independent  Grooming Set up;Wash/dry hands;Wash/dry face;Sitting  Grooming Details (indicate cue type and reason) in recliner  Upper Body Bathing Set up;Sitting  Lower Body Bathing Minimal assistance  Upper Body Dressing  Set up;Sitting  Lower Body Dressing Maximal assistance;Sitting/lateral leans  Lower Body Dressing Details (indicate cue type and  reason) able to do  chair push up to don mesh underwear in recliner with lateral leans; max A to don residual limb guard  Toilet Transfer Minimal assistance;Stand-pivot;BSC;RW  Toilet Transfer Details (indicate cue type and reason) pushes up on RW with stability from OT  Toileting- Clothing Manipulation and Hygiene Set up;Sitting/lateral lean  Functional mobility during ADLs Minimal assistance;Rolling walker;+2 for safety/equipment (SPT only)  General ADL Comments cognition impacting ability to perform ADL   Bed Mobility  Overal bed mobility Needs Assistance  Bed Mobility Supine to Sit  Supine to sit Supervision  General bed mobility comments use of rails to assist, supervision for safety  Transfers  Overall transfer level Needs assistance  Equipment used Rolling walker (2 wheeled)  Transfers Sit to/from [employer] Transfers  Sit to Stand Min guard  Stand pivot transfers Min assist  General transfer comment Pt pushes up using RW (despite cues to push from bed), min A for SPT for balance and safety - transferred to the left  Balance  Overall balance assessment Needs assistance  Sitting-balance support No upper extremity supported;Feet supported  Sitting balance-Leahy Scale Good  Standing balance support Bilateral upper extremity supported  Standing balance-Leahy Scale Poor  Standing balance comment Reliant on RW for balance  OT - End of Session  Equipment Utilized During Treatment Gait belt;Rolling walker;Other (comment) (residual limb protector)  Activity Tolerance Patient tolerated treatment well  Patient left in chair;with call bell/phone within reach  Nurse Communication Mobility status  OT Assessment  OT Recommendation/Assessment Patient needs continued OT Services  OT Visit Diagnosis Unsteadiness on feet (R26.81);Other abnormalities of gait and mobility (R26.89);Other symptoms and signs involving cognitive function;Pain  Pain - Right/Left Right  Pain - part of body Leg   OT Problem List Decreased activity tolerance;Impaired balance (sitting and/or standing);Decreased cognition;Decreased safety awareness;Decreased knowledge of use of DME or AE;Decreased knowledge of precautions;Pain  OT Plan  OT Frequency (ACUTE ONLY) Min 3X/week  OT Treatment/Interventions (ACUTE ONLY) Self-care/ADL training;Therapeutic exercise;Energy conservation;DME and/or AE instruction;Therapeutic activities;Balance training;Patient/family education  AM-PAC OT "6 Clicks" Daily Activity Outcome Measure  Help from another person eating meals? 4  Help from another person taking care of personal grooming? 3  Help from another person toileting, which includes using toliet, bedpan, or urinal? 3  Help from another person bathing (including washing, rinsing, drying)? 2  Help from another person to put on and taking off regular upper body clothing? 4  Help from another person to put on and taking off regular lower body clothing? 2  6 Click Score 18  ADL G Code Conversion CK  OT Recommendation  Recommendations for Other Services Rehab consult  Follow Up Recommendations CIR;Supervision/Assistance - 24 hour  OT Equipment Other (comment) (defer to next venue)  Individuals Consulted  Consulted and Agree with Results and Recommendations Patient  Acute Rehab OT Goals  Patient Stated Goal none stated  OT Goal Formulation With patient  Time For Goal Achievement 04/29/18  Potential to Achieve Goals Good  OT Time Calculation  OT Start Time (ACUTE ONLY) 0834  OT Stop Time (ACUTE ONLY) 0912  OT Time Calculation (min) 38 min  OT General Charges  $OT Visit 1 Visit  OT Evaluation  $OT Eval Moderate Complexity 1 Mod  OT Treatments  $Self Care/Home Management  23-37 mins  $Therapeutic Activity 23-37 mins  Written Expression  Dominant Hand Right   Hulda Humphrey OTR/L [phone]

## 2018-04-15 NOTE — Progress Notes (Addendum)
Physical Therapy Treatment Note  Patient is making gradual progress toward mobility goals however limited by drowsiness and fatigue. Pt with decreased safety awareness. +2 for safety with gait progression. Recommending CIR for further skilled PT services to maximize independence and safety with mobility prior to d/c home with husband's assistance.    04/15/18 1002  PT Visit Information  Last PT Received On 04/15/18  Assistance Needed +2 (chair follow)  History of Present Illness [name] is a 52 y.o.femalewith a known history of DM, COPD, GERD, OSA , CVA, HLD and insomniapresented to the ED for evaluation of AMS. 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     Subjective Data  Patient Stated Goal none stated  Precautions  Precautions Fall  Restrictions  Weight Bearing Restrictions Yes  RLE Weight Bearing NWB  Pain Assessment  Pain Assessment Faces  Faces Pain Scale 6  Pain Location residual limb at incision  Pain Descriptors / Indicators Aching;Discomfort;Burning;Sore  Pain Intervention(s) Limited activity within patient's tolerance;Monitored during session;Premedicated before session;Repositioned  Cognition  Arousal/Alertness Suspect due to medications  Behavior During Therapy [facility] for tasks assessed/performed  Overall Cognitive Status Impaired/Different from baseline  Area of Impairment Orientation;Attention;Memory;Safety/judgement;Problem solving  Orientation Level Disoriented to;Time  Current Attention Level Sustained  Memory Decreased short-term memory;Decreased recall of precautions  Safety/Judgement Decreased awareness of safety;Decreased awareness of deficits  Problem Solving Difficulty sequencing;Requires verbal cues  Bed Mobility  General bed mobility comments pt OOB in chair upon arrival  Transfers  Overall transfer level Needs assistance  Equipment used Rolling walker (2 wheeled)  Transfers Sit to/from Stand  Sit to Stand Supervision  General transfer comment cues for  safe hand placement and to get closer to seated surface prior to attempting to sit down; pt attempted to stand initially without RW   Ambulation/Gait  Ambulation/Gait assistance Min guard;Min assist  Ambulation Distance (Feet)  (65ft X2)  Assistive device Rolling walker (2 wheeled)  Gait Pattern/deviations Step-to pattern  General Gait Details cues for posture and safe proximity to RW; assist for balance at times due to posterior bias   Balance  Overall balance assessment Needs assistance  Sitting balance-Leahy Scale Good  Standing balance-Leahy Scale Fair  Standing balance comment Reliant on RW for balance   Exercises  Exercises Amputee  Amputee Exercises  Knee Extension 10 reps;AROM;Right  Quad Sets AROM;Right;5 reps  Hip Flexion/Marching AROM;Right;5 reps  Straight Leg Raises AROM;Right;5 reps  PT - End of Session  Equipment Utilized During Treatment Gait belt  Activity Tolerance Patient limited by lethargy  Patient left in chair;with call bell/phone within reach  Nurse Communication Mobility status   PT - Assessment/Plan  PT Plan Discharge plan needs to be updated  PT Visit Diagnosis Pain;Difficulty in walking, not elsewhere classified (R26.2);Other abnormalities of gait and mobility (R26.89)  Pain - Right/Left Right  Pain - part of body  (residual limb)  PT Frequency (ACUTE ONLY) Min 3X/week  Recommendations for Other Services OT consult  Follow Up Recommendations CIR  PT equipment Other (comment) (TBD next session)  AM-PAC PT "6 Clicks" Daily Activity Outcome Measure  Difficulty turning over in bed (including adjusting bedclothes, sheets and blankets)? 3  Difficulty moving from lying on back to sitting on the side of the bed?  3  Difficulty sitting down on and standing up from a chair with arms (e.g., wheelchair, bedside commode, etc,.)? 1  Help needed moving to and from a bed to chair (including a wheelchair)? 3  Help needed walking in  hospital room? 3  Help needed  climbing 3-5 steps with a railing?  1  6 Click Score 14  Mobility G Code  CK  PT Goal Progression  Progress towards PT goals Progressing toward goals  PT Time Calculation  PT Start Time (ACUTE ONLY) 0933  PT Stop Time (ACUTE ONLY) 0958  PT Time Calculation (min) (ACUTE ONLY) 25 min  PT General Charges  $$ ACUTE PT VISIT 1 Visit  PT Treatments  $Gait Training 8-22 mins  $Therapeutic Exercise 8-22 mins   Earney Navy, PTA Pager: [phone]

## 2018-04-15 NOTE — Progress Notes (Signed)
PROGRESS NOTE    [name]   XTG:[id number]  DOB: May 29, 1966  DOA: 04/09/2018 PCP: Tamsen Roers, MD   Brief Narrative:  [name]  is a 52 y.o. female with a known history of DM, COPD, GERD, OSA , CVA, HLD and insomnia presents to the emergency department for evaluation of AMS.  Patient was in a usual state of health until two days ago when she became confused & weak.  Husband states that she has had worsening of a wound on the bottom of her right foot associated with Charcot deformity and is scheduled to see podiatry for procedure on 4/22.  States right lower extremity is red, swollen and wound is draining.  He initially attributed her AMS to a new sleeping pill for her insomnia.  In the [facility] long ED the patient was awake, arousable but did not answer questions appropriately.  Her podiatrist, Dr March Rummage was consulted and she was taken for an I and D on 4/17. Was febrile up to 101.4 at midnight. Noted to have blood cultures post for Strep Agalactiae.  Dr March Rummage recommended an ortho consult on 3/18 for a BKA. She was transferred to [facility] today and underwent right BKA  by Dr Sharol Given.   Subjective: No nausea, no vomiting, no fever or chills, reports stomach pain when moved.  Assessment & Plan:    Severe sepsis/ strep agalactiae wound infection and subsequent bacteremia  Cutaneous abscess of right foot  Subacute osteomyelitis, right ankle and foot  - I&D on 4/17. Was febrile up to 101.4 at midnight. Noted to have blood cultures post for Strep Agalactiae. ,Dr March Rummage recommended an ortho consult on 4/18 for a BKA. She was transferred to [facility] and underwent right BKA by Dr Sharol Given On 04/19/202. - Dr Baxter Flattery originally recommended 4 wks of Ampicillin, however, as an amputation has been done, 10-14 days to cover for bacteremia might be sufficient- have asked ID for new recommendations -dr. Wynelle Cleveland have spoken with Dr Baxter Flattery  ID recommends 10 days of antibiotics after neg blood cultures - can switch to  Amoxicillin on d/C. - repeat blood cultures 04/12/2018 remains negative to date, 2-D echo with no evidence of vegetation. - orthopedic input greatly appreciated, regarding follow-up today they recommend new 1 vac pump, patient has stump protector from [employer]otech. - she will also go with a Prevena plus portable wound vac from the hospital and will need to f/u 1 wk after the surgery - She only required one IV Dilaudid dose yesterday, and 1 today, I will discontinue IV pain medications, continue with when necessary Percocet in anticipation for discharge tomorrow  Cigarette smoker/ COPD - nicotine patch- needs to stop smoking - Albutero, Atrovent l PRN  DM2 uncontrolled with neuropathy-  - A1c 9.2 on 3/19,no further hypoglycemia after lowering her Lantus from 36-24 units - Jardiance and Invokana on hold - cont Neurontin   Major depressive disorder, recurrent, mild Polypharmacy  - numerous home meds on hold   - psych evaluated  the patient on 4/17 and recommended to start Celexa 10 mg daily and Hydroxyzine 25 mg TID PRN for anxiety   Hypokalemia - replaced   vaginal rash , - fungal, on Nystatin.she was given Diflucan yesterday  DVT prophylaxis: Lovenox Code Status: Full code Family Communication: husband  Disposition Plan: home with home care tomorrow Consultants:   Podiatry  Ortho  ID Procedures:   Right foot I and D  Right BKA Antimicrobials:  Anti-infectives (From admission, onward)   Start  Dose/Rate Route Frequency Ordered Stop   04/15/18 0715  fluconazole (DIFLUCAN) tablet 150 mg     150 mg Oral  Once 04/15/18 0708 04/15/18 0840   04/13/18 1500  ceFAZolin (ANCEF) IVPB 1 g/50 mL premix     1 g 100 mL/hr over 30 Minutes Intravenous Every 6 hours 04/13/18 1230 04/14/18 0444   04/13/18 0815  ceFAZolin (ANCEF) IVPB 2g/100 mL premix     2 g 200 mL/hr over 30 Minutes Intravenous On call to O.R. 04/13/18 0804 04/13/18 0915   04/12/18 1400  ampicillin (OMNIPEN) 2 g in  sodium chloride 0.9 % 100 mL IVPB     2 g 300 mL/hr over 20 Minutes Intravenous Every 4 hours 04/12/18 1250     04/12/18 0200  vancomycin (VANCOCIN) 1,500 mg in sodium chloride 0.9 % 500 mL IVPB  Status:  Discontinued     1,500 mg 250 mL/hr over 120 Minutes Intravenous Daily 04/12/18 0143 04/12/18 1023   04/11/18 1845  vancomycin (VANCOCIN) powder 500 mg     500 mg Other To Surgery 04/11/18 1837 04/11/18 1916   04/11/18 1600  cefTRIAXone (ROCEPHIN) 2 g in sodium chloride 0.9 % 100 mL IVPB  Status:  Discontinued     2 g 200 mL/hr over 30 Minutes Intravenous Every 24 hours 04/11/18 1102 04/12/18 1246   04/11/18 1600  metroNIDAZOLE (FLAGYL) IVPB 500 mg  Status:  Discontinued     500 mg 100 mL/hr over 60 Minutes Intravenous Every 8 hours 04/11/18 1102 04/12/18 1246   04/10/18 2000  vancomycin (VANCOCIN) 1,500 mg in sodium chloride 0.9 % 500 mL IVPB  Status:  Discontinued     1,500 mg 250 mL/hr over 120 Minutes Intravenous Every 24 hours 04/09/18 2121 04/11/18 1102   04/09/18 2359  piperacillin-tazobactam (ZOSYN) IVPB 3.375 g     3.375 g 12.5 mL/hr over 240 Minutes Intravenous Every 8 hours 04/09/18 2121 04/11/18 1130   04/09/18 2315  piperacillin-tazobactam (ZOSYN) IVPB 3.375 g  Status:  Discontinued     3.375 g 100 mL/hr over 30 Minutes Intravenous  Once 04/09/18 2311 04/09/18 2316   04/09/18 2315  vancomycin (VANCOCIN) IVPB 1000 mg/200 mL premix  Status:  Discontinued     1,000 mg 200 mL/hr over 60 Minutes Intravenous  Once 04/09/18 2311 04/09/18 2317   04/09/18 1815  vancomycin (VANCOCIN) 2,000 mg in sodium chloride 0.9 % 500 mL IVPB     2,000 mg 250 mL/hr over 120 Minutes Intravenous STAT 04/09/18 1813 04/09/18 2150   04/09/18 1800  piperacillin-tazobactam (ZOSYN) IVPB 3.375 g     3.375 g 100 mL/hr over 30 Minutes Intravenous  Once 04/09/18 1757 04/09/18 1932   04/09/18 1800  vancomycin (VANCOCIN) IVPB 1000 mg/200 mL premix  Status:  Discontinued     1,000 mg 200 mL/hr over 60  Minutes Intravenous  Once 04/09/18 1757 04/09/18 1813       Objective: Vitals:   04/14/18 0429 04/14/18 1652 04/14/18 2011 04/15/18 0528  BP: (!) 114/58 (!) 118/59 120/71 122/60  Pulse: 84 60 80 94  Resp:  18 18 19   Temp: 98.8 F (37.1 C) 98.9 F (37.2 C) 98.7 F (37.1 C) 98.7 F (37.1 C)  TempSrc: Axillary Oral Oral Oral  SpO2: 90% 98% 96% 90%  Weight:      Height:        Intake/Output Summary (Last 24 hours) at 04/15/2018 1120 Last data filed at 04/14/2018 1700 Gross per 24 hour  Intake 884.67 ml  Output  605 ml  Net 279.67 ml   Filed Weights   04/09/18 2251 04/11/18 1700 04/13/18 0820  Weight: 96.2 kg (212 lb 1.3 oz) 96 kg (211 lb 10.3 oz) 96 kg (211 lb 10.3 oz)    Examination: General exam:awake alert 3,sitting in recliner in No apparent distress Respiratory system: clear to auscultation, good air entry bilaterally Cardiovascular system: S1 & S2 heard, RRR.   Gastrointestinal system: Abdomen soft,nontender, nondistended, bowel sounds present Central nervous system: Alert and oriented. No focal neurological deficits. Extremities:  right BKAwith stump protector, connected to wound vac Skin: No rashes or ulcers Psychiatry:  Mood & affect appropriate.     Data Reviewed: I have personally reviewed following labs and imaging studies  CBC: Recent Labs  Lab 04/09/18 1828 04/10/18 0542 04/12/18 0553 04/13/18 0555 04/14/18 0708  WBC 30.3* 22.8* 12.6* 13.7* 12.4*  NEUTROABS 28.2*  --   --   --   --   HGB 13.0 10.5* 10.1* 10.8* 10.6*  HCT 39.1 32.0* 30.8* 34.4* 33.4*  MCV 95.1 96.1 96.0 96.9 96.3  PLT 261 221 232 293 151   Basic Metabolic Panel: Recent Labs  Lab 04/09/18 1828 04/10/18 0542 04/11/18 0553 04/12/18 0553 04/13/18 0555 04/14/18 0708  NA 131* 132*  --  138 140 138  K 4.6 3.8  --  3.2* 3.3* 4.0  CL 90* 97*  --  106 107 105  CO2 25 25  --  23 24 25   GLUCOSE 385* 166*  --  161* 116* 111*  BUN 15 14  --  6 7 <5*  CREATININE 1.05* 0.84 0.64  0.58 0.58 0.60  CALCIUM 9.5 8.1*  --  8.2* 8.4* 8.5*  MG  --   --   --  1.7 1.8  --    GFR: Estimated Creatinine Clearance: 94.3 mL/min (by C-G formula based on SCr of 0.6 mg/dL). Liver Function Tests: Recent Labs  Lab 04/09/18 1828 04/10/18 0542  AST 16 10*  ALT 16 11*  ALKPHOS 110 87  BILITOT 0.3 0.5  PROT 7.6 6.0*  ALBUMIN 3.3* 2.4*   No results for input(s): LIPASE, AMYLASE in the last 168 hours. No results for input(s): AMMONIA in the last 168 hours. Coagulation Profile: No results for input(s): INR, PROTIME in the last 168 hours. Cardiac Enzymes: No results for input(s): CKTOTAL, CKMB, CKMBINDEX, TROPONINI in the last 168 hours. BNP (last 3 results) No results for input(s): PROBNP in the last 8760 hours. HbA1C: No results for input(s): HGBA1C in the last 72 hours. CBG: Recent Labs  Lab 04/14/18 0757 04/14/18 1119 04/14/18 1659 04/14/18 2008 04/15/18 0604  GLUCAP 157* 197* 189* 180* 149*   Lipid Profile: No results for input(s): CHOL, HDL, LDLCALC, TRIG, CHOLHDL, LDLDIRECT in the last 72 hours. Thyroid Function Tests: No results for input(s): TSH, T4TOTAL, FREET4, T3FREE, THYROIDAB in the last 72 hours. Anemia Panel: No results for input(s): VITAMINB12, FOLATE, FERRITIN, TIBC, IRON, RETICCTPCT in the last 72 hours. Urine analysis:    Component Value Date/Time   COLORURINE YELLOW 04/09/2018 1828   APPEARANCEUR HAZY (A) 04/09/2018 1828   LABSPEC 1.022 04/09/2018 1828   PHURINE 6.0 04/09/2018 1828   GLUCOSEU >=500 (A) 04/09/2018 1828   GLUCOSEU >=1000 (A) 01/07/2015 1410   HGBUR NEGATIVE 04/09/2018 1828   BILIRUBINUR NEGATIVE 04/09/2018 1828   KETONESUR NEGATIVE 04/09/2018 1828   PROTEINUR NEGATIVE 04/09/2018 1828   UROBILINOGEN 0.2 04/23/2015 2355   NITRITE NEGATIVE 04/09/2018 1828   LEUKOCYTESUR SMALL (A) 04/09/2018 1828  Sepsis Labs: @LABRCNTIP (procalcitonin:4,lacticidven:4) ) Recent Results (from the past 240 hour(s))  Blood Culture (routine x  2)     Status: Abnormal   Collection Time: 04/09/18  6:28 PM  Result Value Ref Range Status   Specimen Description   Final    BLOOD BLOOD LEFT FOREARM Performed at [facility], [place] [address] [place], [place] [address]    Special Requests   Final    BOTTLES DRAWN AEROBIC AND ANAEROBIC Blood Culture adequate volume Performed at [place] [address] [place], [place] [address]    Culture  Setup Time   Final    GRAM POSITIVE COCCI IN PAIRS AND CHAINS ANAEROBIC BOTTLE ONLY CRITICAL RESULT CALLED TO, READ BACK BY AND VERIFIED WITH: Marcy Siren PharmD 11:45 04/10/18 ([email or web address]) Performed at [facility], [place] [address], [place], [place] [address]    Culture GROUP B STREP(S.AGALACTIAE)ISOLATED (A)  Final   Report Status 04/12/2018 FINAL  Final   Organism ID, Bacteria GROUP B STREP(S.AGALACTIAE)ISOLATED  Final      Susceptibility   Group b strep(s.agalactiae)isolated - MIC*    CLINDAMYCIN >=1 RESISTANT Resistant     AMPICILLIN <=0.25 SENSITIVE Sensitive     ERYTHROMYCIN >=8 RESISTANT Resistant     VANCOMYCIN 0.5 SENSITIVE Sensitive     CEFTRIAXONE <=0.12 SENSITIVE Sensitive     LEVOFLOXACIN 1 SENSITIVE Sensitive     * GROUP B STREP(S.AGALACTIAE)ISOLATED  Urine culture     Status: Abnormal   Collection Time: 04/09/18  6:28 PM  Result Value Ref Range Status   Specimen Description   Final    URINE, RANDOM Performed at [place] [address], [place], [place] [address]    Special Requests   Final    URINE, RANDOM Performed at [place] [address], [place], [place] [address]    Culture MULTIPLE SPECIES PRESENT, SUGGEST RECOLLECTION (A)  Final   Report Status 04/11/2018 FINAL  Final  Blood Culture ID Panel (Reflexed)     Status: Abnormal   Collection Time: 04/09/18  6:28 PM  Result Value Ref Range Status   Enterococcus species NOT DETECTED NOT DETECTED Final   Listeria monocytogenes  NOT DETECTED NOT DETECTED Final   Staphylococcus species NOT DETECTED NOT DETECTED Final   Staphylococcus aureus NOT DETECTED NOT DETECTED Final   Streptococcus species DETECTED (A) NOT DETECTED Final    Comment: CRITICAL RESULT CALLED TO, READ BACK BY AND VERIFIED WITH: Marcy Siren PharmD 11:45 04/10/18 ([email or web address])    Streptococcus agalactiae DETECTED (A) NOT DETECTED Final    Comment: CRITICAL RESULT CALLED TO, READ BACK BY AND VERIFIED WITH: Marcy Siren PharmD 11:45 04/10/18 ([email or web address])    Streptococcus pneumoniae NOT DETECTED NOT DETECTED Final   Streptococcus pyogenes NOT DETECTED NOT DETECTED Final   Acinetobacter baumannii NOT DETECTED NOT DETECTED Final   Enterobacteriaceae species NOT DETECTED NOT DETECTED Final   Enterobacter cloacae complex NOT DETECTED NOT DETECTED Final   Escherichia coli NOT DETECTED NOT DETECTED Final   Klebsiella oxytoca NOT DETECTED NOT DETECTED Final   Klebsiella pneumoniae NOT DETECTED NOT DETECTED Final   Proteus species NOT DETECTED NOT DETECTED Final   Serratia marcescens NOT DETECTED NOT DETECTED Final   Haemophilus influenzae NOT DETECTED NOT DETECTED Final   Neisseria meningitidis NOT DETECTED NOT DETECTED Final   Pseudomonas aeruginosa NOT DETECTED NOT DETECTED Final   Candida albicans NOT DETECTED NOT DETECTED Final   Candida glabrata NOT DETECTED NOT DETECTED Final  Candida krusei NOT DETECTED NOT DETECTED Final   Candida parapsilosis NOT DETECTED NOT DETECTED Final   Candida tropicalis NOT DETECTED NOT DETECTED Final    Comment: Performed at [facility], [place] [address] [place], [place] [address]  Blood Culture (routine x 2)     Status: None (Preliminary result)   Collection Time: 04/09/18 11:33 PM  Result Value Ref Range Status   Specimen Description   Final    BLOOD RIGHT ANTECUBITAL Performed at [facility] [address], [place], [place] [address]    Special Requests   Final    BOTTLES DRAWN AEROBIC AND  ANAEROBIC Blood Culture adequate volume Performed at [facility] [address], [place], [place] [address]    Culture   Final    NO GROWTH 4 DAYS Performed at [facility], [place] [address], [place], [place] [address]    Report Status PENDING  Incomplete  Surgical pcr screen     Status: Abnormal   Collection Time: 04/11/18  2:25 PM  Result Value Ref Range Status   MRSA, PCR POSITIVE (A) NEGATIVE Final    Comment: RESULT CALLED TO, READ BACK BY AND VERIFIED WITH: PAM MENTLEY,RN [id number] @ 1731 BY J SCOTTON    Staphylococcus aureus POSITIVE (A) NEGATIVE Final    Comment: (NOTE) The Xpert SA Assay (FDA approved for NASAL specimens in patients 9 years of age and older), is one component of a comprehensive surveillance program. It is not intended to diagnose infection nor to guide or monitor treatment. Performed at [facility], [place] [address] [place], [place] [address]   Fungus Culture With Stain     Status: None (Preliminary result)   Collection Time: 04/11/18  7:09 PM  Result Value Ref Range Status   Fungus Stain Final report  Final    Comment: (NOTE) Performed At: [facility] [place], [place] [id number] Rush Farmer MD HY:[id number]    Fungus (Mycology) Culture PENDING  Incomplete   Fungal Source WOUND  Final    Comment: FOOT RIGHT Performed at [facility], [place] [address], [place], [place] [address]   Aerobic/Anaerobic Culture (surgical/deep wound)     Status: None (Preliminary result)   Collection Time: 04/11/18  7:09 PM  Result Value Ref Range Status   Specimen Description   Final    WOUND FOOT RIGHT Performed at [place] [address], [place], [place] [address]    Special Requests   Final    NONE Performed at [facility], [place] [address], [place], [place] [address]    Gram Stain   Final    MODERATE WBC PRESENT, PREDOMINANTLY  PMN ABUNDANT GRAM POSITIVE COCCI Performed at [facility], [place] [address], [place], [place] [address]    Culture   Final    MODERATE GROUP B STREP(S.AGALACTIAE)ISOLATED TESTING AGAINST S. AGALACTIAE NOT ROUTINELY PERFORMED DUE TO PREDICTABILITY OF AMP/PEN/VAN SUSCEPTIBILITY. RARE STAPHYLOCOCCUS AUREUS NO ANAEROBES ISOLATED; CULTURE IN PROGRESS FOR 5 DAYS    Report Status PENDING  Incomplete  Fungus Culture Result     Status: None   Collection Time: 04/11/18  7:09 PM  Result Value Ref Range Status   Result 1 Comment  Final    Comment: (NOTE) KOH/Calcofluor preparation:  no fungus observed. Performed At: [facility] [place], [place] [id number] Rush Farmer MD PT:[id number] Performed at [facility], [place] [address]  [place], [place] [address]   Fungus Culture With Stain     Status: None (Preliminary result)   Collection Time: 04/11/18  7:38 PM  Result Value Ref Range Status   Fungus Stain Final report  Final    Comment: (NOTE) Performed At: [facility] [place], [place] [id number] Rush Farmer MD QI:[id number]    Fungus (Mycology) Culture PENDING  Incomplete   Fungal Source BONE  Final    Comment: FOOT RIGHT Performed at [facility], [place] [address], [place], [place] [address]   Aerobic/Anaerobic Culture (surgical/deep wound)     Status: None (Preliminary result)   Collection Time: 04/11/18  7:38 PM  Result Value Ref Range Status   Specimen Description   Final    BONE FOOT RIGHT Performed at [place] [address] [place], [place] [address]    Special Requests   Final    NONE Performed at [facility], [place] [address], [place], [place] [address]    Gram Stain   Final    RARE WBC PRESENT, PREDOMINANTLY PMN FEW GRAM POSITIVE COCCI Performed at [facility], [place] [address] [place], [place] [address]    Culture   Final     MODERATE GROUP B STREP(S.AGALACTIAE)ISOLATED TESTING AGAINST S. AGALACTIAE NOT ROUTINELY PERFORMED DUE TO PREDICTABILITY OF AMP/PEN/VAN SUSCEPTIBILITY. RARE STAPHYLOCOCCUS AUREUS REPEATING SENSITIVITIES NO ANAEROBES ISOLATED; CULTURE IN PROGRESS FOR 5 DAYS    Report Status PENDING  Incomplete  Fungus Culture Result     Status: None   Collection Time: 04/11/18  7:38 PM  Result Value Ref Range Status   Result 1 Comment  Final    Comment: (NOTE) KOH/Calcofluor preparation:  no fungus observed. Performed At: [facility] [place], [place] [id number] Rush Farmer MD FI:[id number] Performed at [facility], [place] [address] [place], [place] [address]   Fungus Culture With Stain     Status: None (Preliminary result)   Collection Time: 04/11/18  7:41 PM  Result Value Ref Range Status   Fungus Stain Final report  Final    Comment: (NOTE) Performed At: [facility] [place], [place] [id number] Rush Farmer MD AT:[id number]    Fungus (Mycology) Culture PENDING  Incomplete   Fungal Source FOOT  Final    Comment: RIGHT Performed at [facility], [place] [address] [place], [place] [address]   Aerobic/Anaerobic Culture (surgical/deep wound)     Status: None (Preliminary result)   Collection Time: 04/11/18  7:41 PM  Result Value Ref Range Status   Specimen Description   Final    WOUND FOOT RIGHT Performed at [place] [address] [place], [place] [address]    Special Requests   Final    NONE Performed at [facility], [place] [address], [place], [place] [address]    Gram Stain   Final    RARE WBC PRESENT, PREDOMINANTLY PMN NO ORGANISMS SEEN Performed at [facility], [place] [address] [place],  [address]    Culture   Final    FEW GROUP B STREP(S.AGALACTIAE)ISOLATED TESTING AGAINST S. AGALACTIAE NOT ROUTINELY PERFORMED DUE TO PREDICTABILITY OF  AMP/PEN/VAN SUSCEPTIBILITY. NO ANAEROBES ISOLATED; CULTURE IN PROGRESS FOR 5 DAYS    Report Status PENDING  Incomplete  Fungus Culture Result     Status: None   Collection Time: 04/11/18  7:41 PM  Result Value Ref Range Status   Result 1  Comment  Final    Comment: (NOTE) KOH/Calcofluor preparation:  no fungus observed. Performed At: [facility] [place], [place] [id number] Rush Farmer MD [id number] Performed at [facility], [place] [address] [place], [place] [address]   Culture, blood (routine x 2)     Status: None (Preliminary result)   Collection Time: 04/12/18 12:01 AM  Result Value Ref Range Status   Specimen Description   Final    BLOOD RIGHT ANTECUBITAL Performed at [place] [address] [place], [place] [address]    Special Requests   Final    BOTTLES DRAWN AEROBIC AND ANAEROBIC Blood Culture adequate volume Performed at [place] [address], [place], [place] [address]    Culture   Final    NO GROWTH 2 DAYS Performed at [place] [address], [place], [place] [address]    Report Status PENDING  Incomplete  Culture, blood (routine x 2)     Status: None (Preliminary result)   Collection Time: 04/12/18 12:01 AM  Result Value Ref Range Status   Specimen Description   Final    BLOOD LEFT HAND Performed at [facility] [place] [address] [place], [place] [address]    Special Requests   Final    BOTTLES DRAWN AEROBIC AND ANAEROBIC Blood Culture adequate volume Performed at [place] [address] [place] [address], [place],  [address]    Culture   Final    NO GROWTH 2 DAYS Performed at [place] [address], [place],  [address]    Report Status PENDING  Incomplete         Radiology Studies: No results found.    Scheduled Meds: . Chlorhexidine Gluconate Cloth  6 each Topical Q0600  . docusate  sodium  100 mg Oral BID  . enoxaparin (LOVENOX) injection  40 mg Subcutaneous Q24H  . gabapentin  600 mg Oral BID  . insulin aspart  0-15 Units Subcutaneous Q4H  . insulin glargine  24 Units Subcutaneous QAC breakfast  . mupirocin ointment  1 application Nasal BID  . nicotine  21 mg Transdermal Daily  . nystatin cream   Topical BID  . QUEtiapine  600 mg Oral QHS  . rOPINIRole  2 mg Oral QHS  . sodium chloride flush  3 mL Intravenous Q12H   Continuous Infusions: . sodium chloride 10 mL/hr at 04/13/18 1245  . ampicillin (OMNIPEN) IV Stopped (04/15/18 0849)  . lactated ringers Stopped (04/13/18 1244)  . methocarbamol (ROBAXIN)  IV       LOS: 6 days    Time spent in minutes: 25 min    Phillips Climes, MD [facility] [email or web address]:[phone] [email or web address] Password [facility] 04/15/2018, 11:20 AM

## 2018-04-16 ENCOUNTER — Ambulatory Visit (HOSPITAL_COMMUNITY): Admission: RE | Admit: 2018-04-16 | Payer: Medicare Other | Source: Ambulatory Visit | Admitting: Sports Medicine

## 2018-04-16 ENCOUNTER — Encounter (HOSPITAL_COMMUNITY): Payer: Self-pay | Admitting: *Deleted

## 2018-04-16 ENCOUNTER — Inpatient Hospital Stay (HOSPITAL_COMMUNITY)
Admission: RE | Admit: 2018-04-16 | Discharge: 2018-04-26 | DRG: 559 | Disposition: A | Discharge status: Other Acute Inpatient Hospital | Payer: Medicare Other | Source: Intra-hospital | Attending: Physical Medicine & Rehabilitation | Admitting: Physical Medicine & Rehabilitation

## 2018-04-16 ENCOUNTER — Other Ambulatory Visit: Payer: Self-pay

## 2018-04-16 ENCOUNTER — Encounter (HOSPITAL_COMMUNITY): Admission: RE | Payer: Self-pay | Source: Ambulatory Visit

## 2018-04-16 DIAGNOSIS — Z9119 Patient's noncompliance with other medical treatment and regimen: Secondary | ICD-10-CM

## 2018-04-16 DIAGNOSIS — S88119D Complete traumatic amputation at level between knee and ankle, unspecified lower leg, subsequent encounter: Secondary | ICD-10-CM | POA: Diagnosis not present

## 2018-04-16 DIAGNOSIS — E11319 Type 2 diabetes mellitus with unspecified diabetic retinopathy without macular edema: Secondary | ICD-10-CM | POA: Diagnosis present

## 2018-04-16 DIAGNOSIS — L97529 Non-pressure chronic ulcer of other part of left foot with unspecified severity: Secondary | ICD-10-CM | POA: Diagnosis present

## 2018-04-16 DIAGNOSIS — F17210 Nicotine dependence, cigarettes, uncomplicated: Secondary | ICD-10-CM | POA: Diagnosis present

## 2018-04-16 DIAGNOSIS — Z91040 Latex allergy status: Secondary | ICD-10-CM

## 2018-04-16 DIAGNOSIS — L84 Corns and callosities: Secondary | ICD-10-CM | POA: Diagnosis present

## 2018-04-16 DIAGNOSIS — E871 Hypo-osmolality and hyponatremia: Secondary | ICD-10-CM | POA: Diagnosis not present

## 2018-04-16 DIAGNOSIS — Z9049 Acquired absence of other specified parts of digestive tract: Secondary | ICD-10-CM

## 2018-04-16 DIAGNOSIS — Z794 Long term (current) use of insulin: Secondary | ICD-10-CM

## 2018-04-16 DIAGNOSIS — I739 Peripheral vascular disease, unspecified: Secondary | ICD-10-CM

## 2018-04-16 DIAGNOSIS — F4310 Post-traumatic stress disorder, unspecified: Secondary | ICD-10-CM | POA: Diagnosis present

## 2018-04-16 DIAGNOSIS — Z888 Allergy status to other drugs, medicaments and biological substances status: Secondary | ICD-10-CM

## 2018-04-16 DIAGNOSIS — Z90710 Acquired absence of both cervix and uterus: Secondary | ICD-10-CM

## 2018-04-16 DIAGNOSIS — K449 Diaphragmatic hernia without obstruction or gangrene: Secondary | ICD-10-CM | POA: Diagnosis present

## 2018-04-16 DIAGNOSIS — Z85038 Personal history of other malignant neoplasm of large intestine: Secondary | ICD-10-CM

## 2018-04-16 DIAGNOSIS — G4700 Insomnia, unspecified: Secondary | ICD-10-CM | POA: Diagnosis present

## 2018-04-16 DIAGNOSIS — Z79899 Other long term (current) drug therapy: Secondary | ICD-10-CM

## 2018-04-16 DIAGNOSIS — I69311 Memory deficit following cerebral infarction: Secondary | ICD-10-CM

## 2018-04-16 DIAGNOSIS — G8929 Other chronic pain: Secondary | ICD-10-CM | POA: Diagnosis present

## 2018-04-16 DIAGNOSIS — E11610 Type 2 diabetes mellitus with diabetic neuropathic arthropathy: Secondary | ICD-10-CM | POA: Diagnosis present

## 2018-04-16 DIAGNOSIS — E1151 Type 2 diabetes mellitus with diabetic peripheral angiopathy without gangrene: Secondary | ICD-10-CM | POA: Diagnosis present

## 2018-04-16 DIAGNOSIS — E7800 Pure hypercholesterolemia, unspecified: Secondary | ICD-10-CM | POA: Diagnosis present

## 2018-04-16 DIAGNOSIS — E1142 Type 2 diabetes mellitus with diabetic polyneuropathy: Secondary | ICD-10-CM | POA: Diagnosis present

## 2018-04-16 DIAGNOSIS — Z981 Arthrodesis status: Secondary | ICD-10-CM

## 2018-04-16 DIAGNOSIS — E1165 Type 2 diabetes mellitus with hyperglycemia: Secondary | ICD-10-CM | POA: Diagnosis present

## 2018-04-16 DIAGNOSIS — Z8614 Personal history of Methicillin resistant Staphylococcus aureus infection: Secondary | ICD-10-CM

## 2018-04-16 DIAGNOSIS — G4733 Obstructive sleep apnea (adult) (pediatric): Secondary | ICD-10-CM | POA: Diagnosis present

## 2018-04-16 DIAGNOSIS — S88119S Complete traumatic amputation at level between knee and ankle, unspecified lower leg, sequela: Secondary | ICD-10-CM

## 2018-04-16 DIAGNOSIS — Z4781 Encounter for orthopedic aftercare following surgical amputation: Principal | ICD-10-CM

## 2018-04-16 DIAGNOSIS — K219 Gastro-esophageal reflux disease without esophagitis: Secondary | ICD-10-CM | POA: Diagnosis present

## 2018-04-16 DIAGNOSIS — A401 Sepsis due to streptococcus, group B: Secondary | ICD-10-CM | POA: Diagnosis present

## 2018-04-16 DIAGNOSIS — J449 Chronic obstructive pulmonary disease, unspecified: Secondary | ICD-10-CM | POA: Diagnosis present

## 2018-04-16 DIAGNOSIS — Z8249 Family history of ischemic heart disease and other diseases of the circulatory system: Secondary | ICD-10-CM

## 2018-04-16 DIAGNOSIS — Z960 Presence of urogenital implants: Secondary | ICD-10-CM | POA: Diagnosis present

## 2018-04-16 DIAGNOSIS — N319 Neuromuscular dysfunction of bladder, unspecified: Secondary | ICD-10-CM | POA: Diagnosis present

## 2018-04-16 DIAGNOSIS — Z89511 Acquired absence of right leg below knee: Secondary | ICD-10-CM

## 2018-04-16 DIAGNOSIS — E11621 Type 2 diabetes mellitus with foot ulcer: Secondary | ICD-10-CM | POA: Diagnosis present

## 2018-04-16 DIAGNOSIS — Z885 Allergy status to narcotic agent status: Secondary | ICD-10-CM

## 2018-04-16 DIAGNOSIS — G546 Phantom limb syndrome with pain: Secondary | ICD-10-CM | POA: Diagnosis present

## 2018-04-16 DIAGNOSIS — D72829 Elevated white blood cell count, unspecified: Secondary | ICD-10-CM

## 2018-04-16 DIAGNOSIS — R7881 Bacteremia: Secondary | ICD-10-CM | POA: Diagnosis not present

## 2018-04-16 DIAGNOSIS — M549 Dorsalgia, unspecified: Secondary | ICD-10-CM | POA: Diagnosis present

## 2018-04-16 DIAGNOSIS — Z9111 Patient's noncompliance with dietary regimen: Secondary | ICD-10-CM

## 2018-04-16 DIAGNOSIS — Z833 Family history of diabetes mellitus: Secondary | ICD-10-CM

## 2018-04-16 DIAGNOSIS — Z91119 Patient's noncompliance with dietary regimen due to unspecified reason: Secondary | ICD-10-CM

## 2018-04-16 DIAGNOSIS — Z886 Allergy status to analgesic agent status: Secondary | ICD-10-CM

## 2018-04-16 DIAGNOSIS — F319 Bipolar disorder, unspecified: Secondary | ICD-10-CM | POA: Diagnosis present

## 2018-04-16 DIAGNOSIS — Z91199 Patient's noncompliance with other medical treatment and regimen due to unspecified reason: Secondary | ICD-10-CM

## 2018-04-16 DIAGNOSIS — F609 Personality disorder, unspecified: Secondary | ICD-10-CM | POA: Diagnosis present

## 2018-04-16 DIAGNOSIS — S88119A Complete traumatic amputation at level between knee and ankle, unspecified lower leg, initial encounter: Secondary | ICD-10-CM | POA: Diagnosis present

## 2018-04-16 LAB — AEROBIC/ANAEROBIC CULTURE (SURGICAL/DEEP WOUND)

## 2018-04-16 LAB — GLUCOSE, CAPILLARY
GLUCOSE-CAPILLARY: 83 mg/dL (ref 65–99)
GLUCOSE-CAPILLARY: 178 mg/dL — AB (ref 65–99)
Glucose-Capillary: 169 mg/dL — ABNORMAL HIGH (ref 65–99)
Glucose-Capillary: 214 mg/dL — ABNORMAL HIGH (ref 65–99)
Glucose-Capillary: 198 mg/dL — ABNORMAL HIGH (ref 65–99)

## 2018-04-16 LAB — AEROBIC/ANAEROBIC CULTURE W GRAM STAIN (SURGICAL/DEEP WOUND)

## 2018-04-16 LAB — CREATININE, SERUM
Creatinine, Ser: 0.51 mg/dL (ref 0.44–1.00)
GFR calc Af Amer: >60 mL/min (ref >60)
GFR calc non Af Amer: >60 mL/min (ref >60)

## 2018-04-16 SURGERY — EXCISION, EXOSTOSIS, TOE
Anesthesia: Monitor Anesthesia Care | Laterality: Right

## 2018-04-16 MED ORDER — OXYCODONE HCL 5 MG PO TABS
10.0000 mg | ORAL_TABLET | ORAL | Status: DC | PRN
  Administered 2018-04-16 – 2018-04-25 (×40): 15 mg via ORAL
  Filled 2018-04-16 (×41): qty 3

## 2018-04-16 MED ORDER — PROCHLORPERAZINE EDISYLATE 10 MG/2ML IJ SOLN: 5.0000 mg | Freq: Four times a day (QID) | INTRAMUSCULAR | Status: DC | PRN

## 2018-04-16 MED ORDER — DIPHENHYDRAMINE HCL 12.5 MG/5ML PO ELIX
12.5000 mg | ORAL_SOLUTION | Freq: Four times a day (QID) | ORAL | Status: DC | PRN
  Administered 2018-04-24: 25 mg via ORAL
  Filled 2018-04-16: qty 10

## 2018-04-16 MED ORDER — ACETAMINOPHEN 325 MG PO TABS: 325.0000 mg | ORAL_TABLET | Freq: Four times a day (QID) | ORAL | Status: DC | PRN

## 2018-04-16 MED ORDER — PROCHLORPERAZINE MALEATE 5 MG PO TABS: 5.0000 mg | ORAL_TABLET | Freq: Four times a day (QID) | ORAL | Status: DC | PRN

## 2018-04-16 MED ORDER — GUAIFENESIN-DM 100-10 MG/5ML PO SYRP: 5.0000 mL | ORAL_SOLUTION | ORAL | Status: DC | PRN

## 2018-04-16 MED ORDER — NORTRIPTYLINE HCL 10 MG PO CAPS
10.0000 mg | ORAL_CAPSULE | Freq: Every day | ORAL | Status: DC
  Administered 2018-04-16 – 2018-04-18 (×3): 10 mg via ORAL
  Filled 2018-04-16 (×3): qty 1

## 2018-04-16 MED ORDER — NICOTINE 21 MG/24HR TD PT24
21.0000 mg | MEDICATED_PATCH | Freq: Every day | TRANSDERMAL | Status: DC
  Administered 2018-04-17 – 2018-04-26 (×10): 21 mg via TRANSDERMAL
  Filled 2018-04-16 (×10): qty 1

## 2018-04-16 MED ORDER — DOCUSATE SODIUM 100 MG PO CAPS
100.0000 mg | ORAL_CAPSULE | Freq: Two times a day (BID) | ORAL | Status: DC
  Administered 2018-04-16 – 2018-04-26 (×20): 100 mg via ORAL
  Filled 2018-04-16 (×20): qty 1

## 2018-04-16 MED ORDER — BLISTEX MEDICATED EX OINT: TOPICAL_OINTMENT | CUTANEOUS | Status: DC | PRN

## 2018-04-16 MED ORDER — SENNOSIDES-DOCUSATE SODIUM 8.6-50 MG PO TABS
1.0000 | ORAL_TABLET | Freq: Every evening | ORAL | Status: DC | PRN
  Filled 2018-04-16: qty 1

## 2018-04-16 MED ORDER — ACETAMINOPHEN 325 MG PO TABS
325.0000 mg | ORAL_TABLET | ORAL | Status: DC | PRN
  Administered 2018-04-17 – 2018-04-25 (×9): 650 mg via ORAL
  Filled 2018-04-16 (×8): qty 2

## 2018-04-16 MED ORDER — MUPIROCIN 2 % EX OINT
1.0000 "application " | TOPICAL_OINTMENT | Freq: Two times a day (BID) | CUTANEOUS | Status: AC
  Administered 2018-04-16: 1 via NASAL

## 2018-04-16 MED ORDER — DOCUSATE SODIUM 100 MG PO CAPS: 100.0000 mg | ORAL_CAPSULE | Freq: Two times a day (BID) | ORAL | 0 refills | Status: DC

## 2018-04-16 MED ORDER — LIDOCAINE 5 % EX PTCH
1.0000 | MEDICATED_PATCH | Freq: Every day | CUTANEOUS | Status: DC | PRN
  Filled 2018-04-16: qty 1

## 2018-04-16 MED ORDER — INSULIN GLARGINE 100 UNIT/ML ~~LOC~~ SOLN: 24.0000 [IU] | Freq: Every day | SUBCUTANEOUS | 11 refills | Status: DC

## 2018-04-16 MED ORDER — POLYETHYLENE GLYCOL 3350 17 G PO PACK: 17.0000 g | PACK | Freq: Every day | ORAL | Status: DC | PRN

## 2018-04-16 MED ORDER — GABAPENTIN 300 MG PO CAPS
600.0000 mg | ORAL_CAPSULE | Freq: Two times a day (BID) | ORAL | Status: DC
  Administered 2018-04-16 – 2018-04-26 (×20): 600 mg via ORAL
  Filled 2018-04-16 (×20): qty 2

## 2018-04-16 MED ORDER — POLYETHYLENE GLYCOL 3350 17 G PO PACK
17.0000 g | PACK | Freq: Every day | ORAL | Status: DC | PRN
Start: 2018-04-16 — End: 2018-04-26
  Administered 2018-04-20: 17 g via ORAL
  Filled 2018-04-16: qty 1

## 2018-04-16 MED ORDER — CHLORHEXIDINE GLUCONATE CLOTH 2 % EX PADS: 6.0000 | MEDICATED_PAD | Freq: Every day | CUTANEOUS | Status: AC

## 2018-04-16 MED ORDER — NYSTATIN 100000 UNIT/GM EX CREA
1.0000 "application " | TOPICAL_CREAM | Freq: Two times a day (BID) | CUTANEOUS | Status: DC
  Administered 2018-04-16 – 2018-04-21 (×9): 1 via TOPICAL
  Filled 2018-04-16 (×2): qty 15

## 2018-04-16 MED ORDER — QUETIAPINE FUMARATE 100 MG PO TABS
600.0000 mg | ORAL_TABLET | Freq: Every day | ORAL | Status: DC
  Administered 2018-04-16 – 2018-04-25 (×10): 600 mg via ORAL
  Filled 2018-04-16 (×10): qty 6

## 2018-04-16 MED ORDER — FLEET ENEMA 7-19 GM/118ML RE ENEM: 1.0000 | ENEMA | Freq: Once | RECTAL | Status: DC | PRN

## 2018-04-16 MED ORDER — PROCHLORPERAZINE 25 MG RE SUPP: 12.5000 mg | Freq: Four times a day (QID) | RECTAL | Status: DC | PRN

## 2018-04-16 MED ORDER — ROPINIROLE HCL 1 MG PO TABS
2.0000 mg | ORAL_TABLET | Freq: Every day | ORAL | Status: DC
  Administered 2018-04-16 – 2018-04-25 (×10): 2 mg via ORAL
  Filled 2018-04-16 (×10): qty 2

## 2018-04-16 MED ORDER — QUETIAPINE FUMARATE 100 MG PO TABS: 100.0000 mg | ORAL_TABLET | Freq: Every day | ORAL | Status: DC

## 2018-04-16 MED ORDER — INSULIN ASPART 100 UNIT/ML ~~LOC~~ SOLN
0.0000 [IU] | Freq: Three times a day (TID) | SUBCUTANEOUS | Status: DC
  Administered 2018-04-16: 3 [IU] via SUBCUTANEOUS
  Administered 2018-04-17 (×2): 2 [IU] via SUBCUTANEOUS
  Administered 2018-04-17 – 2018-04-18 (×2): 3 [IU] via SUBCUTANEOUS
  Administered 2018-04-18 (×2): 2 [IU] via SUBCUTANEOUS
  Administered 2018-04-19: 8 [IU] via SUBCUTANEOUS
  Administered 2018-04-19 (×2): 3 [IU] via SUBCUTANEOUS
  Administered 2018-04-20: 5 [IU] via SUBCUTANEOUS
  Administered 2018-04-20: 3 [IU] via SUBCUTANEOUS
  Administered 2018-04-20: 2 [IU] via SUBCUTANEOUS
  Administered 2018-04-20 – 2018-04-21 (×2): 3 [IU] via SUBCUTANEOUS
  Administered 2018-04-21: 2 [IU] via SUBCUTANEOUS
  Administered 2018-04-21: 8 [IU] via SUBCUTANEOUS
  Administered 2018-04-21: 2 [IU] via SUBCUTANEOUS
  Administered 2018-04-22: 8 [IU] via SUBCUTANEOUS
  Administered 2018-04-22 – 2018-04-23 (×4): 3 [IU] via SUBCUTANEOUS
  Administered 2018-04-23: 2 [IU] via SUBCUTANEOUS
  Administered 2018-04-23 (×2): 3 [IU] via SUBCUTANEOUS
  Administered 2018-04-24: 2 [IU] via SUBCUTANEOUS
  Administered 2018-04-24: 5 [IU] via SUBCUTANEOUS
  Administered 2018-04-24 – 2018-04-25 (×2): 3 [IU] via SUBCUTANEOUS
  Administered 2018-04-26: 2 [IU] via SUBCUTANEOUS

## 2018-04-16 MED ORDER — AMOXICILLIN 500 MG PO CAPS: 1000.0000 mg | ORAL_CAPSULE | Freq: Three times a day (TID) | ORAL | Status: DC

## 2018-04-16 MED ORDER — OXYCODONE HCL 5 MG PO TABS: 5.0000 mg | ORAL_TABLET | ORAL | Status: DC | PRN

## 2018-04-16 MED ORDER — METHOCARBAMOL 500 MG PO TABS
500.0000 mg | ORAL_TABLET | Freq: Four times a day (QID) | ORAL | Status: DC | PRN
  Administered 2018-04-16 – 2018-04-26 (×17): 500 mg via ORAL
  Filled 2018-04-16 (×17): qty 1

## 2018-04-16 MED ORDER — BISACODYL 10 MG RE SUPP: 10.0000 mg | Freq: Every day | RECTAL | Status: DC | PRN

## 2018-04-16 MED ORDER — ALUM & MAG HYDROXIDE-SIMETH 200-200-20 MG/5ML PO SUSP
30.0000 mL | ORAL | Status: DC | PRN
  Administered 2018-04-18 – 2018-04-21 (×5): 30 mL via ORAL
  Filled 2018-04-16 (×6): qty 30

## 2018-04-16 MED ORDER — INSULIN ASPART 100 UNIT/ML ~~LOC~~ SOLN: 0.0000 [IU] | SUBCUTANEOUS | 11 refills | Status: DC

## 2018-04-16 MED ORDER — BISACODYL 5 MG PO TBEC: 5.0000 mg | DELAYED_RELEASE_TABLET | Freq: Every day | ORAL | 0 refills | Status: DC | PRN

## 2018-04-16 MED ORDER — ALBUTEROL SULFATE (2.5 MG/3ML) 0.083% IN NEBU: 2.5000 mg | INHALATION_SOLUTION | Freq: Four times a day (QID) | RESPIRATORY_TRACT | Status: DC | PRN

## 2018-04-16 MED ORDER — INSULIN GLARGINE 100 UNIT/ML ~~LOC~~ SOLN
24.0000 [IU] | Freq: Every day | SUBCUTANEOUS | Status: DC
  Administered 2018-04-17 – 2018-04-23 (×7): 24 [IU] via SUBCUTANEOUS
  Filled 2018-04-16 (×7): qty 0.24

## 2018-04-16 MED ORDER — OXYCODONE HCL 5 MG PO TABS: 5.0000 mg | ORAL_TABLET | ORAL | 0 refills | Status: DC | PRN

## 2018-04-16 MED ORDER — NICOTINE 21 MG/24HR TD PT24: 21.0000 mg | MEDICATED_PATCH | Freq: Every day | TRANSDERMAL | 0 refills | Status: DC

## 2018-04-16 MED ORDER — ENOXAPARIN SODIUM 40 MG/0.4ML ~~LOC~~ SOLN
40.0000 mg | SUBCUTANEOUS | Status: DC
  Administered 2018-04-17 – 2018-04-25 (×9): 40 mg via SUBCUTANEOUS
  Filled 2018-04-16 (×10): qty 0.4

## 2018-04-16 MED ORDER — BISACODYL 5 MG PO TBEC: 5.0000 mg | DELAYED_RELEASE_TABLET | Freq: Every day | ORAL | Status: DC | PRN

## 2018-04-16 MED ORDER — METHOCARBAMOL 1000 MG/10ML IJ SOLN
500.0000 mg | Freq: Four times a day (QID) | INTRAVENOUS | Status: DC | PRN
  Filled 2018-04-16: qty 5

## 2018-04-16 MED ORDER — SODIUM CHLORIDE 0.9 % IV SOLN
2.0000 g | INTRAVENOUS | Status: DC
  Administered 2018-04-16 – 2018-04-19 (×15): 2 g via INTRAVENOUS
  Filled 2018-04-16 (×17): qty 2000

## 2018-04-16 MED ORDER — INSULIN ASPART 100 UNIT/ML ~~LOC~~ SOLN
5.0000 [IU] | Freq: Three times a day (TID) | SUBCUTANEOUS | Status: DC
  Administered 2018-04-17 – 2018-04-22 (×16): 5 [IU] via SUBCUTANEOUS

## 2018-04-16 MED ORDER — ENOXAPARIN SODIUM 40 MG/0.4ML ~~LOC~~ SOLN: 40.0000 mg | SUBCUTANEOUS | Status: DC

## 2018-04-16 MED ORDER — ONDANSETRON HCL 4 MG PO TABS: 4.0000 mg | ORAL_TABLET | Freq: Four times a day (QID) | ORAL | 0 refills | Status: DC | PRN

## 2018-04-16 MED ORDER — IPRATROPIUM BROMIDE 0.02 % IN SOLN
0.5000 mg | Freq: Four times a day (QID) | RESPIRATORY_TRACT | Status: DC | PRN
  Filled 2018-04-16: qty 2.5

## 2018-04-16 NOTE — Progress Notes (Signed)
Physical Therapy Treatment Patient Details Name: [name] MRN: [id number] DOB: November 27, 1966 Today's Date: 04/16/2018    History of Present Illness [name] is a 52 y.o.femalewith a known history of DM, COPD, GERD, OSA , CVA, HLD and insomniapresented to the ED for evaluation of AMS. 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       PT Comments    Pt performed gait and functional mobility.  Pt presents with poor safety awareness and required cues to use correct hand placement and maintain close proximity to RW.  Pt remains deconditioned which limits her gait distance.  Pt remains a strong candidate for CIR therapies to improve strength and function and return home in a more independent state.    Follow Up Recommendations  CIR     Equipment Recommendations  Other (comment)(TBD next session)    Recommendations for Other Services OT consult     Precautions / Restrictions Precautions Precautions: Fall Restrictions Weight Bearing Restrictions: Yes RLE Weight Bearing: Non weight bearing    Mobility  Bed Mobility Overal bed mobility: Needs Assistance Bed Mobility: Supine to Sit     Supine to sit: Supervision Sit to supine: Supervision   General bed mobility comments: Pt performed bed mobility with cues for safety to avoid pulling on lines/leads.    Transfers Overall transfer level: Needs assistance Equipment used: Rolling walker (2 wheeled) Transfers: Sit to/from Stand Sit to Stand: Supervision Stand pivot transfers: Min assist       General transfer comment: Cues for safe hand placement, patient tends to pull up on RW vs. pushing from seated surface.    Ambulation/Gait Ambulation/Gait assistance: Min guard;Min assist Ambulation Distance (Feet): 20 Feet(+ 10 ft.  ) Assistive device: Rolling walker (2 wheeled) Gait Pattern/deviations: Step-to pattern(hop to pattern with flexed posture.  ) Gait velocity: decreased   General Gait Details: cues for posture and safe  proximity to RW; adjusted RW height to improve leverage.  Pt fatigues quickly.     Stairs             Wheelchair Mobility    Modified Rankin (Stroke Patients Only)       Balance Overall balance assessment: Needs assistance Sitting-balance support: No upper extremity supported;Feet supported Sitting balance-Leahy Scale: Good       Standing balance-Leahy Scale: Fair                              Cognition Arousal/Alertness: Suspect due to medications Behavior During Therapy: WFL for tasks assessed/performed Overall Cognitive Status: Impaired/Different from baseline                                        Exercises      General Comments        Pertinent Vitals/Pain Pain Assessment: Faces Faces Pain Scale: Hurts little more Pain Location: residual limb at incision Pain Descriptors / Indicators: Aching;Discomfort;Burning;Sore Pain Intervention(s): Monitored during session;Repositioned    Home Living                      Prior Function            PT Goals (current goals can now be found in the care plan section) Acute Rehab PT Goals Patient Stated Goal: none stated Potential to Achieve Goals: Good Progress towards PT goals: Progressing toward  goals    Frequency    Min 3X/week      PT Plan Current plan remains appropriate    Co-evaluation              AM-PAC PT "6 Clicks" Daily Activity  Outcome Measure  Difficulty turning over in bed (including adjusting bedclothes, sheets and blankets)?: A Little Difficulty moving from lying on back to sitting on the side of the bed? : A Little Difficulty sitting down on and standing up from a chair with arms (e.g., wheelchair, bedside commode, etc,.)?: Unable Help needed moving to and from a bed to chair (including a wheelchair)?: A Little Help needed walking in hospital room?: A Little Help needed climbing 3-5 steps with a railing? : Total 6 Click Score: 14     End of Session Equipment Utilized During Treatment: Gait belt Activity Tolerance: Patient limited by lethargy Patient left: in chair;with call bell/phone within reach Nurse Communication: Mobility status PT Visit Diagnosis: Pain;Difficulty in walking, not elsewhere classified (R26.2);Other abnormalities of gait and mobility (R26.89) Pain - Right/Left: Right Pain - part of body: (residual limb)     Time: 2233-6122 PT Time Calculation (min) (ACUTE ONLY): 24 min  Charges:  $Gait Training: 8-22 mins $Therapeutic Activity: 8-22 mins                    G CodesGovernor Rooks, PTA pager [phone]    Cristela Blue 04/16/2018, 3:29 PM

## 2018-04-16 NOTE — Progress Notes (Signed)
Physical Medicine and Rehabilitation Consult   Reason for Consult: R- BKA Referring Physician: Dr. Sharol Given   HPI: [name] is a 52 y.o. female with history of COPD, OSA, poorly controlled DM with insensate neuropathy and retinopathy, ongoing tobacco use-3-4 PPD, BLE edema, charcot foot who was admitted via ED on 04/09/18 with weakness, mental status changes and worsening of foot wound. She was found to have step agalactiae sepsis and was started on IV antibiotics for treatment. MRI foot revealed early osteomyelitis with abscess and she underwent I & D  Right foot wound without improvement in healing.  Dr. Sharol Given was consulted for input and recommended BKA. She underwent BKA on 04/19 with recommendations for tobacco cessation and better diabetes control. Therapy evaluation done this weekend and CIR recommended due to functional deficits.     Review of Systems  Constitutional: Negative for chills and fever.  HENT: Negative for tinnitus.   Eyes: Positive for blurred vision (legally blind? has difficulty reading. ).  Respiratory: Negative for cough and shortness of breath.   Cardiovascular: Negative for chest pain and palpitations.  Gastrointestinal: Positive for nausea. Negative for abdominal pain and heartburn.  Genitourinary: Negative for dysuria and urgency.  Musculoskeletal: Positive for falls.  Skin: Negative for itching and rash.  Neurological: Positive for sensory change. Negative for dizziness and headaches.  Psychiatric/Behavioral: The patient has insomnia.           Past Medical History:  Diagnosis Date  . Anxiety   . Arthritis   . Bipolar disorder ([place])   . Broken jaw ([place])   . Broken wrist   . Chronic back pain   . Claudication ([place])    a. 12/2013 ABI's: R 0.97, L 0.94.  [name] COPD (chronic obstructive pulmonary disease) ([place])   . Depression   . Diabetic Charcot's foot ([place])   . Diabetic foot ulcer ([facility])    chronic left foot ulcer , great toe/  hx  recurrent foot ulcer bilaterally  . DJD (degenerative joint disease)   . Edema of both lower extremities   . Episode of memory loss   . GERD (gastroesophageal reflux disease)   . Headache(784.0)   . Hiatal hernia   . History of colon cancer, stage I dx 02-26-2015--- oncologist-- dr [name]--- per last note no recurrence   05-16-2015 s/p  Laparoscopic sigmoid colectomy w/ node bx's (negative nodes per path)  Stage I (pT1,N0,M0) Grade 2  . 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   per MRI multiple small strokes post cardiac cath. --  residual mild memory loss  . History of methicillin resistant staphylococcus aureus (MRSA)   . Hypercholesterolemia   . Insomnia   . Insulin dependent type 2 diabetes mellitus, uncontrolled ([place]) dx 2004   endocrinologist-  dr Dwyane Dee-- last A1c 8.8 in Aug2018:  pt is noncompliant w/ diet, states does note eat breakfast , her first main meal in afternoon  . Neurogenic bladder   . Neuropathy, diabetic ([place])    hands and feet  . OSA (obstructive sleep apnea)    cpap intolerant  . Personality disorder ([place])   . SOB (shortness of breath) on exertion   . Stroke ([place])   . SUI (stress urinary incontinence, female) S/P SLING 12-29-2011         Past Surgical History:  Procedure Laterality Date  . ABDOMINAL HYSTERECTOMY    . AMPUTATION Right 04/13/2018   Procedure: RIGHT BELOW KNEE AMPUTATION;  Surgeon: Newt Minion,  MD;  Location: [facility];  Service: Orthopedics;  Laterality: Right;  . ANTERIOR CERVICAL DECOMP/DISCECTOMY FUSION  2000   C5 - 7  . CARDIAC CATHETERIZATION  05-22-2008   DR SKAINS   NO SIGNIFECANT CAD/ NORMAL LV/  EF 65%/  NO WALL MOTION ABNORMALITIES  . CARPAL TUNNEL RELEASE Right 04-25-2013  . [place]; 1992  . COLONOSCOPY    . CYSTO N/A 04/30/2013   Procedure: CYSTOSCOPY;  Surgeon: Reece Packer, MD;  Location: [facility];  Service: Urology;  Laterality: N/A;  . CYSTOSCOPY  MACROPLASTIQUE IMPLANT N/A 02/06/2018   Procedure: CYSTOSCOPY MACROPLASTIQUE IMPLANT;  Surgeon: Bjorn Loser, MD;  Location[facility];  Service: Urology;  Laterality: N/A;  . CYSTOSCOPY WITH INJECTION  05/04/2012   Procedure: CYSTOSCOPY WITH INJECTION;  Surgeon: Reece Packer, MD;  Location: [facility];  Service: Urology;  Laterality: N/A;  MACROPLASTIQUE INJECTION  . CYSTOSCOPY WITH INJECTION  08/28/2012   Procedure: CYSTOSCOPY WITH INJECTION;  Surgeon: Reece Packer, MD;  Location: [facility];  Service: Urology;  Laterality: N/A;  cysto and macroplastique   . FOOT SURGERY Bilateral   . HERNIA REPAIR  ?1996   "stomach"  . INCISION AND DRAINAGE OF WOUND Right 04/11/2018   Procedure: IRRIGATION AND DEBRIDEMENT WOUND right foot and right ankle;  Surgeon: Evelina Bucy, DPM;  Location: [facility];  Service: Podiatry;  Laterality: Right;  . KNEE ARTHROSCOPY W/ ALLOGRAFT IMPANT Left    graft x 2  . KNEE SURGERY     TOTAL 8 SURG'S  . LAPAROSCOPIC SIGMOID COLECTOMY N/A 04/22/2015   Procedure: LAPAROSCOPIC HAND ASSISTED SIGMOID COLECTOMY;  Surgeon: Erroll Luna, MD;  Location: [facility];  Service: General;  Laterality: N/A;  . LEFT HEART CATHETERIZATION WITH CORONARY ANGIOGRAM N/A 02/12/2014   Procedure: LEFT HEART CATHETERIZATION WITH CORONARY ANGIOGRAM;  Surgeon: Candee Furbish, MD;  Location[facility];  Service: Cardiovascular;  Laterality: N/A;   No angiographically significant CAD; normal LVSF, LVEDP 45mmHg,  EF 55% (new finding ef 30% myoview 01-08-2014)  . LUMBAR FUSION    . MANDIBLE FRACTURE SURGERY    . MULTIPLE LAPAROSCOPIES FOR ENDOMETRIOSIS    . PUBOVAGINAL SLING  12/29/2011   Procedure: Gaynelle Arabian;  Surgeon: Reece Packer, MD;  Location[facility];  Service: Urology;  Laterality: N/A;  cysto and sparc sling   . PUBOVAGINAL SLING N/A 04/30/2013   Procedure: REMOVAL OF VAGINAL MESH;   Surgeon: Reece Packer, MD;  [facility];  Service: Urology;  Laterality: N/A;  . RECONSTURCTION OF CONGENITAL UTERUS ANOMALY  1983  . REPEAT RECONSTRUCTION ACL LEFT KNEE/ SCREWS REMOVED  03-28-2000   CADAVER GRAFT  . TOTAL ABDOMINAL HYSTERECTOMY W/ BILATERAL SALPINGOOPHORECTOMY  1997  . TRANSTHORACIC ECHOCARDIOGRAM  02/13/2014   ef 45%, hypokinesis base inferior and base inferolateral walls  . UPPER GI ENDOSCOPY    . WOUND DEBRIDEMENT Right 09/05/2016   Procedure: DEBRIDEMENT WOUND WITH GRAFT RIGHT FOOT;  Surgeon: Landis Martins, DPM;  Location: [facility];  Service: Podiatry;  Laterality: Right;  . WRIST FRACTURE SURGERY           Family History  Problem Relation Age of Onset  . Hypertension Mother   . Diabetes Mother   . Cancer - Other Mother        lymphoma   . Cancer - Other Father        lung, bladder cancer   . Heart attack Father   .  Cancer - Other Brother        bladder cancer     Social History:  Married. Independent with AD PTA. Husband disabled and limited due to stroke and cardiac issues. She  reports that she has been smoking cigarettes.  She has a 180.00 pack-year smoking history. She has never used smokeless tobacco. She reports that she does not drink alcohol or use drugs.         Allergies  Allergen Reactions  . Latex Itching, Rash and Other (See Comments)    Pt states she cannot use condoms - cause an infection.  Use of latex on skin is okay.  Tape causes rash  . Sweetening Enhancer [Flavoring Agent] Nausea And Vomiting and Other (See Comments)    HEADACHES  . Aspartame And Phenylalanine Nausea And Vomiting    HEADACHES  . Ibuprofen Other (See Comments)    HEADACHES  . Trazodone And Nefazodone Other (See Comments)    Hallucinations   . Triazolam Other (See Comments)    HALLUCINATIONS          Medications Prior to Admission  Medication Sig Dispense Refill  . Cariprazine HCl (VRAYLAR) 4.5 MG CAPS Take 4.5  mg by mouth at bedtime.    . cyclobenzaprine (FLEXERIL) 10 MG tablet Take 1 tablet (10 mg total) by mouth 3 (three) times daily as needed for muscle spasms. 90 tablet 2  . empagliflozin (JARDIANCE) 25 MG TABS tablet Take 25 mg by mouth daily. 90 tablet 0  . gabapentin (NEURONTIN) 600 MG tablet TAKE 1 TABLET (600 MG TOTAL) BY MOUTH 2 (TWO) TIMES DAILY. 60 tablet 5  . insulin aspart (NOVOLOG) 100 UNIT/ML FlexPen Take 10 units before your main meal. If blood sugars are consistently over 250 after the meal start taking 14 units before your main meal 35 UNITS AT SUPPER 45 mL 2  . Insulin Glargine (BASAGLAR KWIKPEN) 100 UNIT/ML SOPN Inject 0.36 mLs (36 Units total) into the skin daily before breakfast. And pen needles 1/day 5 pen 2  . lidocaine (LIDODERM) 5 % Place 1 patch onto the skin daily as needed (pain). Remove & Discard patch within 12 hours or as directed by MD 30 patch 2  . Melatonin 10 MG CAPS Take 10 mg by mouth at bedtime.     . nortriptyline (PAMELOR) 10 MG capsule Start nortriptyline 10mg  at bedtime for 2 week, then increase to 2 tablet at bedtime 60 capsule 5  . QUEtiapine (SEROQUEL) 300 MG tablet Take 600 mg by mouth at bedtime.    [name] rOPINIRole (REQUIP) 2 MG tablet Take 2 mg by mouth daily.    . tamsulosin (FLOMAX) 0.4 MG CAPS capsule Take 0.4 mg by mouth every morning.     . Tasimelteon (HETLIOZ) 20 MG CAPS Take 20 mg by mouth at bedtime.    Minus Liberty Tosylate (INGREZZA) 80 MG CAPS Take 80 mg by mouth daily.    [name] zolpidem (AMBIEN) 5 MG tablet Take 5 mg by mouth at bedtime as needed for sleep.    [name] glucose blood (FREESTYLE LITE) test strip Use to check blood sugars up to 4 times daily. Dx Code E11.65 100 each 3  . INVOKANA 300 MG TABS tablet TAKE 1 TABLET BY MOUTH EVERY DAY BEFORE BREAKFAST (Patient not taking: Reported on 04/09/2018) 30 tablet 2  . SYRINGE-NEEDLE, DISP, 3 ML (LUER LOCK SAFETY SYRINGES) 25G X 1" 3 ML MISC 1 Package by Does not apply route daily. 50 each  0  Home: Home Living Family/patient expects to be discharged to:: Private residence Living Arrangements: Spouse/significant other Available Help at Discharge: Family Type of Home: House Home Access: Ramped entrance, Stairs to enter Technical brewer of Steps: 2 Entrance Stairs-Rails: None Home Layout: One level Bathroom Shower/Tub: Chiropodist: Standard Bathroom Accessibility: No Home Equipment: Environmental consultant - 4 wheels  Functional History: Prior Function Level of Independence: Independent Comments: per husband, pt was independent with mobility prior to admission Functional Status:  Mobility: Bed Mobility Overal bed mobility: Needs Assistance Bed Mobility: Supine to Sit Supine to sit: Supervision Sit to supine: Min assist, HOB elevated General bed mobility comments: pt OOB in chair upon arrival Transfers Overall transfer level: Needs assistance Equipment used: Rolling walker (2 wheeled) Transfers: Sit to/from Stand Sit to Stand: Supervision Stand pivot transfers: Min assist General transfer comment: cues for safe hand placement and to get closer to seated surface prior to attempting to sit down; pt attempted to stand initially without RW  Ambulation/Gait Ambulation/Gait assistance: Min guard, Min assist Ambulation Distance (Feet): (41ft X2) Assistive device: Rolling walker (2 wheeled) Gait Pattern/deviations: Step-to pattern General Gait Details: cues for posture and safe proximity to RW; assist for balance at times due to posterior bias  Gait velocity: decreased  ADL: ADL Overall ADL's : Needs assistance/impaired Eating/Feeding: Modified independent Grooming: Set up, Wash/dry hands, Wash/dry face, Sitting Grooming Details (indicate cue type and reason): in recliner Upper Body Bathing: Set up, Sitting Lower Body Bathing: Minimal assistance Upper Body Dressing : Set up, Sitting Lower Body Dressing: Maximal assistance, Sitting/lateral  leans Lower Body Dressing Details (indicate cue type and reason): able to do chair push up to don mesh underwear in recliner with lateral leans; max A to don residual limb guard Toilet Transfer: Minimal assistance, Stand-pivot, BSC, RW Toilet Transfer Details (indicate cue type and reason): pushes up on RW with stability from OT Toileting- Clothing Manipulation and Hygiene: Set up, Sitting/lateral lean Functional mobility during ADLs: Minimal assistance, Rolling walker, +2 for safety/equipment(SPT only) General ADL Comments: cognition impacting ability to perform ADL   Cognition: Cognition Overall Cognitive Status: Impaired/Different from baseline Orientation Level: Oriented X4 Cognition Arousal/Alertness: Suspect due to medications Behavior During Therapy: WFL for tasks assessed/performed Overall Cognitive Status: Impaired/Different from baseline Area of Impairment: Orientation, Attention, Memory, Safety/judgement, Problem solving Orientation Level: Disoriented to, Time Current Attention Level: Sustained Memory: Decreased short-term memory, Decreased recall of precautions Safety/Judgement: Decreased awareness of safety, Decreased awareness of deficits Problem Solving: Difficulty sequencing, Requires verbal cues General Comments: Pt drowsy throughout session, will awaken to her name or activity but shortly falls back asleep. Does not recall who worked with her yesterday or how to don residual limb protector.   Blood pressure (!) 100/54, pulse 75, temperature 98.2 F (36.8 C), temperature source Oral, resp. rate 16, height 5\' 5"  (1.651 m), weight 96 kg (211 lb 10.3 oz), SpO2 (!) 87 %. Physical Exam  Nursing note and vitals reviewed. Constitutional: She is oriented to person, place, and time. She appears well-developed and well-nourished. No distress.  HENT:  Head: Normocephalic and atraumatic.  Mouth/Throat: Oropharynx is clear and moist.  Eyes: Pupils are equal, round, and  reactive to light. Conjunctivae and EOM are normal.  Neck: Normal range of motion. Neck supple.  Cardiovascular: Normal rate and regular rhythm.  Respiratory: Effort normal and breath sounds normal. No respiratory distress. She has no wheezes.  GI: Soft. Bowel sounds are normal. She exhibits no distension. There is no tenderness.  Musculoskeletal: She exhibits  tenderness. She exhibits no edema.  Right stump with limb guard. Tender to palpation.   Neurological: She is alert and oriented to person, place, and time.  UE 5/5. RLE 3- to 3/5. LLE: 4/5. Decreased LT left foot  Skin: Skin is warm and dry.  Psychiatric: She has a normal mood and affect. Her behavior is normal.   Assessment/Plan: Diagnosis: right BKA 1. Does the need for close, 24 hr/day medical supervision in concert with the patient's rehab needs make it unreasonable for this patient to be served in a less intensive setting? Yes 2. Co-Morbidities requiring supervision/potential complications: pain mgt, diabetes 2, depression, recent sepsis, peripheral neuropathy 3. Due to bladder management, bowel management, safety, skin/wound care, disease management, medication administration, pain management and patient education, does the patient require 24 hr/day rehab nursing? Yes 4. Does the patient require coordinated care of a physician, rehab nurse, PT (1-2 hrs/day, 5 days/week) and OT (1-2 hrs/day, 5 days/week) to address physical and functional deficits in the context of the above medical diagnosis(es)? Yes Addressing deficits in the following areas: balance, endurance, locomotion, strength, transferring, bowel/bladder control, bathing, dressing, feeding, grooming, toileting and psychosocial support 5. Can the patient actively participate in an intensive therapy program of at least 3 hrs of therapy per day at least 5 days per week? Yes 6. The potential for patient to make measurable gains while on inpatient rehab is  excellent 7. Anticipated functional outcomes upon discharge from inpatient rehab are modified independent  with PT, modified independent and supervision with OT, n/a with SLP. 8. Estimated rehab length of stay to reach the above functional goals is: 7-10 days 9. Anticipated D/C setting: Home 10. Anticipated post D/C treatments: [facility] therapy 11. Overall Rehab/Functional Prognosis: excellent  RECOMMENDATIONS: This patient's condition is appropriate for continued rehabilitative care in the following setting: CIR Patient has agreed to participate in recommended program. Potentially Note that insurance prior authorization may be required for reimbursement for recommended care.  Comment: Rehab Admissions Coordinator to follow up.  Thanks,  Meredith Staggers, MD, Mellody Drown     Bary Leriche, PA-C 04/16/2018          Revision History                             Routing History

## 2018-04-16 NOTE — Discharge Summary (Signed)
[name], is a 52 y.o. female  DOB 09-16-1966  MRN [id number].  Admission date:  04/09/2018  Admitting Physician  Harvie Bridge, DO  Discharge Date:  04/16/2018   Primary MD  Tamsen Roers, MD  Recommendations for primary care physician for things to follow:  - Continue with amoxicillin until 04/22/2018   Admission Diagnosis  Severe sepsis ([place]) [A41.9, R65.20] Sepsis due to cellulitis ([place]) [L03.90, A41.9]   Discharge Diagnosis  Severe sepsis ([place]) [A41.9, R65.20] Sepsis due to cellulitis ([place]) [L03.90, A41.9]    Active Problems:   Severe sepsis ([place])   Major depressive disorder, recurrent episode ([place])   Diabetic ulcer of right midfoot associated with type 2 diabetes mellitus, with necrosis of bone (HCC)   Abscess of ankle   Infectious synovitis   Diabetic foot infection ([place])   Cutaneous abscess of right foot   Subacute osteomyelitis, right ankle and foot ([place])      Past Medical History:  Diagnosis Date  . Anxiety   . Arthritis   . Bipolar disorder ([place])   . Broken jaw ([place])   . Broken wrist   . Chronic back pain   . Claudication ([place])    a. 12/2013 ABI's: R 0.97, L 0.94.  [name] COPD (chronic obstructive pulmonary disease) ([place])   . Depression   . Diabetic Charcot's foot ([place])   . Diabetic foot ulcer (HCC)    chronic left foot ulcer , great toe/  hx recurrent foot ulcer bilaterally  . DJD (degenerative joint disease)   . Edema of both lower extremities   . Episode of memory loss   . GERD (gastroesophageal reflux disease)   . Headache(784.0)   . Hiatal hernia   . History of colon cancer, stage I dx 02-26-2015--- oncologist-- dr [name]--- per last note no recurrence   05-16-2015 s/p  Laparoscopic sigmoid colectomy w/ node bx's (negative nodes per path)  Stage I (pT1,N0,M0) Grade 2  . 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   per MRI multiple small  strokes post cardiac cath. --  residual mild memory loss  . History of methicillin resistant staphylococcus aureus (MRSA)   . Hypercholesterolemia   . Insomnia   . Insulin dependent type 2 diabetes mellitus, uncontrolled ([place]) dx 2004   endocrinologist-  dr Dwyane Dee-- last A1c 8.8 in Aug2018:  pt is noncompliant w/ diet, states does note eat breakfast , her first main meal in afternoon  . Neurogenic bladder   . Neuropathy, diabetic ([place])    hands and feet  . OSA (obstructive sleep apnea)    cpap intolerant  . Personality disorder ([place])   . SOB (shortness of breath) on exertion   . Stroke ([place])   . SUI (stress urinary incontinence, female) S/P SLING 12-29-2011    Past Surgical History:  Procedure Laterality Date  . ABDOMINAL HYSTERECTOMY    . AMPUTATION Right 04/13/2018   Procedure: RIGHT BELOW KNEE AMPUTATION;  Surgeon: Newt Minion, MD;  Location: [facility];  Service: Orthopedics;  Laterality: Right;  . ANTERIOR CERVICAL DECOMP/DISCECTOMY FUSION  2000   C5 - 7  . CARDIAC CATHETERIZATION  05-22-2008   DR SKAINS   NO SIGNIFECANT CAD/ NORMAL LV/  EF 65%/  NO WALL MOTION ABNORMALITIES  . CARPAL TUNNEL RELEASE Right 04-25-2013  . [place]; 1992  . COLONOSCOPY    . CYSTO N/A 04/30/2013   Procedure: CYSTOSCOPY;  Surgeon: Reece Packer, MD;  [facility];  Service: Urology;  Laterality: N/A;  . CYSTOSCOPY MACROPLASTIQUE IMPLANT N/A 02/06/2018   Procedure: CYSTOSCOPY MACROPLASTIQUE IMPLANT;  Surgeon: Bjorn Loser, MD;  [facility];  Service: Urology;  Laterality: N/A;  . CYSTOSCOPY WITH INJECTION  05/04/2012   Procedure: CYSTOSCOPY WITH INJECTION;  Surgeon: Reece Packer, MD;  Location: [facility];  Service: Urology;  Laterality: N/A;  MACROPLASTIQUE INJECTION  . CYSTOSCOPY WITH INJECTION  08/28/2012   Procedure: CYSTOSCOPY WITH INJECTION;  Surgeon: Reece Packer, MD;  [facility];  Service: Urology;   Laterality: N/A;  cysto and macroplastique   . FOOT SURGERY Bilateral   . HERNIA REPAIR  ?1996   "stomach"  . INCISION AND DRAINAGE OF WOUND Right 04/11/2018   Procedure: IRRIGATION AND DEBRIDEMENT WOUND right foot and right ankle;  Surgeon: Evelina Bucy, DPM;  Location[facility];  Service: Podiatry;  Laterality: Right;  . KNEE ARTHROSCOPY W/ ALLOGRAFT IMPANT Left    graft x 2  . KNEE SURGERY     TOTAL 8 SURG'S  . LAPAROSCOPIC SIGMOID COLECTOMY N/A 04/22/2015   Procedure: LAPAROSCOPIC HAND ASSISTED SIGMOID COLECTOMY;  Surgeon: Erroll Luna, MD;  Location: [facility];  Service: General;  Laterality: N/A;  . LEFT HEART CATHETERIZATION WITH CORONARY ANGIOGRAM N/A 02/12/2014   Procedure: LEFT HEART CATHETERIZATION WITH CORONARY ANGIOGRAM;  Surgeon: Candee Furbish, MD;  Location: [facility];  Service: Cardiovascular;  Laterality: N/A;   No angiographically significant CAD; normal LVSF, LVEDP 64mmHg,  EF 55% (new finding ef 30% myoview 01-08-2014)  . LUMBAR FUSION    . MANDIBLE FRACTURE SURGERY    . MULTIPLE LAPAROSCOPIES FOR ENDOMETRIOSIS    . PUBOVAGINAL SLING  12/29/2011   Procedure: Gaynelle Arabian;  Surgeon: Reece Packer, MD;  Location: [facility];  Service: Urology;  Laterality: N/A;  cysto and sparc sling   . PUBOVAGINAL SLING N/A 04/30/2013   Procedure: REMOVAL OF VAGINAL MESH;  Surgeon: Reece Packer, MD;  [facility];  Service: Urology;  Laterality: N/A;  . RECONSTURCTION OF CONGENITAL UTERUS ANOMALY  1983  . REPEAT RECONSTRUCTION ACL LEFT KNEE/ SCREWS REMOVED  03-28-2000   CADAVER GRAFT  . TOTAL ABDOMINAL HYSTERECTOMY W/ BILATERAL SALPINGOOPHORECTOMY  1997  . TRANSTHORACIC ECHOCARDIOGRAM  02/13/2014   ef 45%, hypokinesis base inferior and base inferolateral walls  . UPPER GI ENDOSCOPY    . WOUND DEBRIDEMENT Right 09/05/2016   Procedure: DEBRIDEMENT WOUND WITH GRAFT RIGHT FOOT;  Surgeon: Landis Martins, DPM;  Location: [facility];  Service: Podiatry;  Laterality:  Right;  . WRIST FRACTURE SURGERY         History of present illness and  Hospital Course:     Kindly see H&P for history of present illness and admission details, please review complete Labs, Consult reports and Test reports for all details in brief  HPI  from the history and physical done on the day of admission    [place] is a 52 y.o.femalewith  a known history of DM, COPD, GERD, OSA , CVA, HLD and insomniapresents to the emergency department for evaluation of AMS. Patient was in a usual state of health until two days ago when she became confused & weak. Husband states that she has had worsening of a wound on the bottom of her right foot associated with Charcot deformity and is scheduled to see podiatry for procedure on 4/22. States right lower extremity is red, swollen and wound is draining. He initially attributed her AMS to a new sleeping pill for her insomnia. In the [facility] long ED the patient was awake, arousable but did not answer questions appropriately.  Her podiatrist, Dr March Rummage was consulted and she was taken for an I and D on 4/17. Was febrile up to 101.4 at midnight. Noted to have blood cultures post for Strep Agalactiae.  Dr March Rummage recommended an ortho consult on 3/18 for a BKA. She was transferred to [facility] and underwent right BKA  by Dr Sharol Given.    Severe sepsis/ strep agalactiae wound infection and subsequent bacteremia  Cutaneous abscess of right foot Subacute osteomyelitis, right ankle and foot  - I&D on 4/17. Was febrile up to 101.4 at midnight. Noted to have blood cultures post for Strep Agalactiae. ,Dr March Rummage recommended an ortho consult on 4/18 for a BKA. She was transferred to [facility] and underwent right BKA by Dr Sharol Given On 04/19/202. - Dr Baxter Flattery originally recommended 4 wks of Ampicillin, however, as an amputation has been done, 10-14 days to cover for bacteremia might be sufficient- have asked ID for new recommendations -dr. Wynelle Cleveland have spoken with  Dr Baxter Flattery  ID recommends 10 days of antibiotics after neg blood cultures - can switch to Amoxicillin on d/C. she is to finish another 6 days as an outpatient with stop date 04/22/2018 of amoxicillin 1 g 3 times a day - repeat blood cultures 04/12/2018 remains negative to date, 2-D echo with no evidence of vegetation. - orthopedic input greatly appreciated, regarding follow-up today they recommend new 1 vac pump, patient has stump protector from [employer]. - she will also go with a Prevena plus portable wound vac from the hospital and will need to f/u 1 wk after the surgery   Cigarette smoker/ COPD - nicotine patch- needs to stop smoking - Albutero, Atrovent l PRN  DM2 uncontrolled with neuropathy-  - A1c 9.2 on 3/19,no further hypoglycemia after lowering her Lantus from 36-24 units - Jardiance and Invokana on hold - cont Neurontin   Major depressive disorder, recurrent, mild Polypharmacy  - You on Lower dose Seroquel 100 mg QHS   Hypokalemia - replaced   vaginal rash , - fungal, treated.she was given Diflucan     Discharge Condition:  stable   Follow UP  Follow-up Information    Newt Minion, MD In 1 week.   Specialty:  Orthopedic Surgery Contact information: [place] [address] [phone]        [facility] Follow up.   Specialty:  [place] Why:  A representative from [facility] will contact you to resume your Home Health therapy. Contact information: [place] [address] [phone]             Discharge Instructions  and  Discharge Medications     Discharge Instructions    Diet - low sodium heart healthy   Complete by:  As directed    Carb modified   Discharge instructions   Complete by:  As directed    Management per CIR   Increase activity slowly   Complete by:  As directed      Allergies as of 04/16/2018      Reactions   Latex Itching, Rash, Other  (See Comments)   Pt states she cannot use condoms - cause an infection.  Use of latex on skin is okay.  Tape causes rash   Sweetening Enhancer [flavoring Agent] Nausea And Vomiting, Other (See Comments)   HEADACHES   Aspartame And Phenylalanine Nausea And Vomiting   HEADACHES   Ibuprofen Other (See Comments)   HEADACHES   Trazodone And Nefazodone Other (See Comments)   Hallucinations   Triazolam Other (See Comments)   HALLUCINATIONS      Medication List    STOP taking these medications   BASAGLAR KWIKPEN 100 UNIT/ML Sopn Replaced by:  insulin glargine 100 UNIT/ML injection   cyclobenzaprine 10 MG tablet Commonly known as:  FLEXERIL   empagliflozin 25 MG Tabs tablet Commonly known as:  JARDIANCE   HETLIOZ 20 MG Caps Generic drug:  Tasimelteon   insulin aspart 100 UNIT/ML FlexPen Commonly known as:  NOVOLOG Replaced by:  insulin aspart 100 UNIT/ML injection   INVOKANA 300 MG Tabs tablet Generic drug:  canagliflozin   SYRINGE-NEEDLE (DISP) 3 ML 25G X 1" 3 ML Misc Commonly known as:  LUER LOCK SAFETY SYRINGES     TAKE these medications   acetaminophen 325 MG tablet Commonly known as:  TYLENOL Take 1-2 tablets (325-650 mg total) by mouth every 6 (six) hours as needed for mild pain (pain score 1-3 or temp > 100.5).   amoxicillin 500 MG capsule Commonly known as:  AMOXIL Take 2 capsules (1,000 mg total) by mouth 3 (three) times daily. Please take till 04/22/2018, then stop.   bisacodyl 5 MG EC tablet Commonly known as:  DULCOLAX Take 1 tablet (5 mg total) by mouth daily as needed for moderate constipation.   docusate sodium 100 MG capsule Commonly known as:  COLACE Take 1 capsule (100 mg total) by mouth 2 (two) times daily.   gabapentin 600 MG tablet Commonly known as:  NEURONTIN TAKE 1 TABLET (600 MG TOTAL) BY MOUTH 2 (TWO) TIMES DAILY.   glucose blood test strip Commonly known as:  FREESTYLE LITE Use to check blood sugars up to 4 times daily. Dx Code E11.65     INGREZZA 80 MG Caps Generic drug:  Valbenazine Tosylate Take 80 mg by mouth daily.   insulin aspart 100 UNIT/ML injection Commonly known as:  novoLOG Inject 0-15 Units into the skin every 4 (four) hours. Replaces:  insulin aspart 100 UNIT/ML FlexPen   insulin glargine 100 UNIT/ML injection Commonly known as:  LANTUS Inject 0.24 mLs (24 Units total) into the skin daily before breakfast. Start taking on:  04/17/2018 Replaces:  BASAGLAR KWIKPEN 100 UNIT/ML Sopn   lidocaine 5 % Commonly known as:  LIDODERM Place 1 patch onto the skin daily as needed (pain). Remove & Discard patch within 12 hours or as directed by MD   Melatonin 10 MG Caps Take 10 mg by mouth at bedtime.   nicotine 21 mg/24hr patch Commonly known as:  NICODERM CQ - dosed in mg/24 hours Place 1 patch (21 mg total) onto the skin daily. Start taking on:  04/17/2018   nortriptyline 10 MG capsule Commonly known as:  PAMELOR Start nortriptyline 10mg  at bedtime for 2 week, then increase to 2 tablet at bedtime   ondansetron 4 MG tablet Commonly known  as:  ZOFRAN Take 1 tablet (4 mg total) by mouth every 6 (six) hours as needed for nausea.   oxyCODONE 5 MG immediate release tablet Commonly known as:  Oxy IR/ROXICODONE Take 1-2 tablets (5-10 mg total) by mouth every 4 (four) hours as needed for moderate pain (pain score 4-6).   QUEtiapine 100 MG tablet Commonly known as:  SEROQUEL Take 1 tablet (100 mg total) by mouth at bedtime. What changed:    medication strength  how much to take   rOPINIRole 2 MG tablet Commonly known as:  REQUIP Take 2 mg by mouth daily.   tamsulosin 0.4 MG Caps capsule Commonly known as:  FLOMAX Take 0.4 mg by mouth every morning.   VRAYLAR 4.5 MG Caps Generic drug:  Cariprazine HCl Take 4.5 mg by mouth at bedtime.   zolpidem 5 MG tablet Commonly known as:  AMBIEN Take 5 mg by mouth at bedtime as needed for sleep.         Diet and Activity recommendation: See Discharge  Instructions above   Consults obtained - ortho, podiatry, ID   Major procedures and Radiology Reports - PLEASE review detailed and final reports for all details, in brief -     Dg Chest 2 View  Result Date: 04/09/2018 CLINICAL DATA:  Altered mental status EXAM: CHEST - 2 VIEW COMPARISON:  01/30/2018 FINDINGS: Postoperative changes of the cervical spine. No acute airspace disease or pleural effusion. Heart size within normal limits. No pneumothorax. IMPRESSION: No active cardiopulmonary disease. Electronically Signed   By: Donavan Foil M.D.   On: 04/09/2018 19:24   Ct Head Wo Contrast  Result Date: 04/09/2018 CLINICAL DATA:  Altered LOC EXAM: CT HEAD WITHOUT CONTRAST TECHNIQUE: Contiguous axial images were obtained from the base of the skull through the vertex without intravenous contrast. COMPARISON:  09/09/2017 head CT FINDINGS: Brain: Mild motion degradation. No acute territorial infarction, hemorrhage or intracranial mass. Normal ventricle size. Vascular: No hyperdense vessel or unexpected calcification. Skull: Normal. Negative for fracture or focal lesion. Sinuses/Orbits: No acute finding. Other: None IMPRESSION: Negative non contrasted CT appearance of the brain Electronically Signed   By: Donavan Foil M.D.   On: 04/09/2018 19:23   Mr Brain Wo Contrast  Result Date: 04/10/2018 CLINICAL DATA:  Altered mental status EXAM: MRI HEAD WITHOUT CONTRAST TECHNIQUE: Multiplanar, multiecho pulse sequences of the brain and surrounding structures were obtained without intravenous contrast. COMPARISON:  Head CT 04/09/2018 FINDINGS: The examination had to be discontinued prior to completion due to patient confusion and inability to cooperate with the technologist's instructions. Axial and coronal diffusion-weighted imaging with corresponding ADC maps, sagittal T1-weighted imaging, axial T2 weighted and FLAIR sequences were obtained. The study is degraded by motion, despite efforts to reduce this  artifact, including utilization of motion-resistant MR sequences. The findings of the study are interpreted in the context of reduced sensitivity/specificity. BRAIN: The midline structures are normal. There is no acute infarct or acute hemorrhage. No mass lesion, hydrocephalus, dural abnormality or extra-axial collection. The brain parenchymal signal is normal for the patient's age. No age-advanced or lobar predominant atrophy. No chronic microhemorrhage or superficial siderosis. VASCULAR: Major intracranial arterial and venous sinus flow voids are preserved. SKULL AND UPPER CERVICAL SPINE: The visualized skull base, calvarium, upper cervical spine and extracranial soft tissues are normal. SINUSES/ORBITS: No fluid levels or advanced mucosal thickening. No mastoid or middle ear effusion. Normal orbits. IMPRESSION: Motion degraded and truncated MRI of the brain without visible abnormality. Electronically Signed   By:  Ulyses Jarred M.D.   On: 04/10/2018 17:28   Mr Foot Right W Wo Contrast  Result Date: 04/10/2018 CLINICAL DATA:  Ulcer along the plantar aspect of the right foot with swelling. EXAM: MRI OF THE RIGHT FOREFOOT WITHOUT AND WITH CONTRAST TECHNIQUE: Multiplanar, multisequence MR imaging of the right forefoot was performed before and after the administration of intravenous contrast. CONTRAST:  80mL MULTIHANCE GADOBENATE DIMEGLUMINE 529 MG/ML IV SOLN COMPARISON:  03/17/2018, 07/15/2018 FINDINGS: Bones/Joint/Cartilage Fragmentation, malalignment and subluxation of the midfoot consistent with a Charcot foot. Large soft tissue ulcer along the plantar aspect of the midfoot. Sinus track from the wound along the plantar aspect extends into the plantar musculature with a 2.9 x 1.8 x 4.8 cm complex peripherally enhancing fluid collection. Surrounding edema and enhancement of the flexor digitorum brevis muscle. Mild cortical irregularity along the plantar aspect of the subluxed cuboid and lateral cuneiform with  associated mild marrow edema without enhancement with the abscess extending to the plantar aspect of these bones. Differential considerations include reactive marrow changes secondary to Charcot foot versus early osteomyelitis. No joint effusion. Ligaments Collateral ligaments are intact. Muscles and Tendons Flexor, peroneal and extensor compartment tendons are intact. Soft tissue No soft tissue mass. IMPRESSION: 1. Large soft tissue ulcer along the plantar aspect of the midfoot. Sinus track from the wound along the plantar aspect extends into the plantar musculature with a 2.9 x 1.8 x 4.8 cm abscess with surrounding edema and enhancement of the flexor digitorum brevis muscle most consistent with pyomyositis. 2. Mild cortical irregularity along the plantar aspect of the subluxed cuboid and lateral cuneiform with associated mild marrow edema without enhancement with the abscess extending to the plantar aspect of these bones. Differential considerations include reactive marrow changes secondary to Charcot foot versus early osteomyelitis. Electronically Signed   By: Kathreen Devoid   On: 04/10/2018 08:36   Dg Foot Complete Right  Result Date: 04/09/2018 CLINICAL DATA:  Ulcer to plantar surface of right foot with swelling and redness EXAM: RIGHT FOOT COMPLETE - 3+ VIEW COMPARISON:  MRI 03/17/2018 FINDINGS: Grossly abnormal appearance of the right foot with Charcot deformity of the midfoot. Lateral and dorsal dislocation at the Lisfranc joint with inferior displacement of the navicular, cuboid and cuneiform bones. Diffuse osteopenia. Moderate plantar calcaneal spur. Soft tissue swelling with large plantar soft tissue protuberance measuring 5.5 cm presumably corresponding to the history of ulcer, with small foci of soft tissue gas. IMPRESSION: 1. Gross deformity of the foot with Charcot deformity of the mid foot and chronic dorsal and lateral dislocation at the Lisfranc joint. Inferior subluxation of the navicular,  cuboid and cuneiform bones; the deformity and bony destructive changes limit evaluation for acute osseous abnormality. 2. Soft tissue swelling with large plantar aspect ulcer Electronically Signed   By: Donavan Foil M.D.   On: 04/09/2018 19:30    Micro Results    Recent Results (from the past 240 hour(s))  Blood Culture (routine x 2)     Status: Abnormal   Collection Time: 04/09/18  6:28 PM  Result Value Ref Range Status   Specimen Description   Final    BLOOD BLOOD LEFT FOREARM Performed at [facility] [address], [place], [place] [address]    Special Requests   Final    BOTTLES DRAWN AEROBIC AND ANAEROBIC Blood Culture adequate volume Performed at [facility] [address] [place], [place] [address]    Culture  Setup Time   Final  GRAM POSITIVE COCCI IN PAIRS AND CHAINS ANAEROBIC BOTTLE ONLY CRITICAL RESULT CALLED TO, READ BACK BY AND VERIFIED WITH: Marcy Siren PharmD 11:45 04/10/18 ([email or web address]) Performed at [facility], [place] [address], [place], [place] [address]    Culture GROUP B STREP(S.AGALACTIAE)ISOLATED (A)  Final   Report Status 04/12/2018 FINAL  Final   Organism ID, Bacteria GROUP B STREP(S.AGALACTIAE)ISOLATED  Final      Susceptibility   Group b strep(s.agalactiae)isolated - MIC*    CLINDAMYCIN >=1 RESISTANT Resistant     AMPICILLIN <=0.25 SENSITIVE Sensitive     ERYTHROMYCIN >=8 RESISTANT Resistant     VANCOMYCIN 0.5 SENSITIVE Sensitive     CEFTRIAXONE <=0.12 SENSITIVE Sensitive     LEVOFLOXACIN 1 SENSITIVE Sensitive     * GROUP B STREP(S.AGALACTIAE)ISOLATED  Urine culture     Status: Abnormal   Collection Time: 04/09/18  6:28 PM  Result Value Ref Range Status   Specimen Description   Final    URINE, RANDOM Performed at [facility] [address] [place], [place] [address]    Special Requests   Final    URINE, RANDOM Performed at [facility] [address],  [place], [place] [address]    Culture MULTIPLE SPECIES PRESENT, SUGGEST RECOLLECTION (A)  Final   Report Status 04/11/2018 FINAL  Final  Blood Culture ID Panel (Reflexed)     Status: Abnormal   Collection Time: 04/09/18  6:28 PM  Result Value Ref Range Status   Enterococcus species NOT DETECTED NOT DETECTED Final   Listeria monocytogenes NOT DETECTED NOT DETECTED Final   Staphylococcus species NOT DETECTED NOT DETECTED Final   Staphylococcus aureus NOT DETECTED NOT DETECTED Final   Streptococcus species DETECTED (A) NOT DETECTED Final    Comment: CRITICAL RESULT CALLED TO, READ BACK BY AND VERIFIED WITH: Marcy Siren PharmD 11:45 04/10/18 ([email or web address])    Streptococcus agalactiae DETECTED (A) NOT DETECTED Final    Comment: CRITICAL RESULT CALLED TO, READ BACK BY AND VERIFIED WITH: Marcy Siren PharmD 11:45 04/10/18 ([email or web address])    Streptococcus pneumoniae NOT DETECTED NOT DETECTED Final   Streptococcus pyogenes NOT DETECTED NOT DETECTED Final   Acinetobacter baumannii NOT DETECTED NOT DETECTED Final   Enterobacteriaceae species NOT DETECTED NOT DETECTED Final   Enterobacter cloacae complex NOT DETECTED NOT DETECTED Final   Escherichia coli NOT DETECTED NOT DETECTED Final   Klebsiella oxytoca NOT DETECTED NOT DETECTED Final   Klebsiella pneumoniae NOT DETECTED NOT DETECTED Final   Proteus species NOT DETECTED NOT DETECTED Final   Serratia marcescens NOT DETECTED NOT DETECTED Final   Haemophilus influenzae NOT DETECTED NOT DETECTED Final   Neisseria meningitidis NOT DETECTED NOT DETECTED Final   Pseudomonas aeruginosa NOT DETECTED NOT DETECTED Final   Candida albicans NOT DETECTED NOT DETECTED Final   Candida glabrata NOT DETECTED NOT DETECTED Final   Candida krusei NOT DETECTED NOT DETECTED Final   Candida parapsilosis NOT DETECTED NOT DETECTED Final   Candida tropicalis NOT DETECTED NOT DETECTED Final    Comment: Performed at [facility], [place]. [address], [place], [place] [address]  Blood  Culture (routine x 2)     Status: None   Collection Time: 04/09/18 11:33 PM  Result Value Ref Range Status   Specimen Description   Final    BLOOD RIGHT ANTECUBITAL Performed at [facility] [address] [place], [place] [address]    Special Requests   Final    BOTTLES DRAWN AEROBIC AND ANAEROBIC  Blood Culture adequate volume Performed at [facility] [address] [place], [place] [address]    Culture   Final    NO GROWTH 5 DAYS Performed at [facility], [place] [address], [place], [place] [address]    Report Status 04/15/2018 FINAL  Final  Surgical pcr screen     Status: Abnormal   Collection Time: 04/11/18  2:25 PM  Result Value Ref Range Status   MRSA, PCR POSITIVE (A) NEGATIVE Final    Comment: RESULT CALLED TO, READ BACK BY AND VERIFIED WITH: PAM MENTLEY,RN [id number] @ 1731 BY J SCOTTON    Staphylococcus aureus POSITIVE (A) NEGATIVE Final    Comment: (NOTE) The Xpert SA Assay (FDA approved for NASAL specimens in patients 28 years of age and older), is one component of a comprehensive surveillance program. It is not intended to diagnose infection nor to guide or monitor treatment. Performed at [facility], [place] [address], [place], [place] [address]   Fungus Culture With Stain     Status: None (Preliminary result)   Collection Time: 04/11/18  7:09 PM  Result Value Ref Range Status   Fungus Stain Final report  Final    Comment: (NOTE) Performed At: [facility] [place], [place] [id number] Rush Farmer MD [id number]    Fungus (Mycology) Culture PENDING  Incomplete   Fungal Source WOUND  Final    Comment: FOOT RIGHT Performed at [facility], [place] [address], [place], [place] [address]   Aerobic/Anaerobic Culture (surgical/deep wound)     Status: None   Collection Time: 04/11/18  7:09 PM  Result Value Ref Range Status   Specimen Description   Final     WOUND FOOT RIGHT Performed at [place] [address] [place], [place] [address]    Special Requests   Final    NONE Performed at [facility], [place] [address], [place], [place] [address]    Gram Stain   Final    MODERATE WBC PRESENT, PREDOMINANTLY PMN ABUNDANT GRAM POSITIVE COCCI    Culture   Final    MODERATE GROUP B STREP(S.AGALACTIAE)ISOLATED TESTING AGAINST S. AGALACTIAE NOT ROUTINELY PERFORMED DUE TO PREDICTABILITY OF AMP/PEN/VAN SUSCEPTIBILITY. RARE STAPHYLOCOCCUS AUREUS NO ANAEROBES ISOLATED SUSCEPTIBILITIES PERFORMED ON PREVIOUS CULTURE WITHIN THE LAST 5 DAYS. Performed at [facility], [place] [address], [place], [place] [address]    Report Status 04/16/2018 FINAL  Final  Fungus Culture Result     Status: None   Collection Time: 04/11/18  7:09 PM  Result Value Ref Range Status   Result 1 Comment  Final    Comment: (NOTE) KOH/Calcofluor preparation:  no fungus observed. Performed At: [facility] [place], [place] [id number] Rush Farmer MD [id number] Performed at [facility], [place] [address], [place], [place] [address]   Fungus Culture With Stain     Status: None (Preliminary result)   Collection Time: 04/11/18  7:38 PM  Result Value Ref Range Status   Fungus Stain Final report  Final    Comment: (NOTE) Performed At: [facility] [place], [place] [id number] Rush Farmer MD [id number]    Fungus (Mycology) Culture PENDING  Incomplete   Fungal Source BONE  Final    Comment: FOOT RIGHT Performed at [facility], [place] [address] [place], [place] [address]   Aerobic/Anaerobic Culture (surgical/deep wound)     Status: None  Collection Time: 04/11/18  7:38 PM  Result Value Ref Range Status   Specimen Description   Final    BONE FOOT RIGHT Performed at [place] [address], [place], [place]  [address]    Special Requests   Final    NONE Performed at [facility], [place] [address], [place], [place] [address]    Gram Stain   Final    RARE WBC PRESENT, PREDOMINANTLY PMN FEW GRAM POSITIVE COCCI    Culture   Final    MODERATE GROUP B STREP(S.AGALACTIAE)ISOLATED TESTING AGAINST S. AGALACTIAE NOT ROUTINELY PERFORMED DUE TO PREDICTABILITY OF AMP/PEN/VAN SUSCEPTIBILITY. RARE METHICILLIN RESISTANT STAPHYLOCOCCUS AUREUS NO ANAEROBES ISOLATED Performed at [facility], [place] [address] [place], [place] [address]    Report Status 04/16/2018 FINAL  Final   Organism ID, Bacteria METHICILLIN RESISTANT STAPHYLOCOCCUS AUREUS  Final      Susceptibility   Methicillin resistant staphylococcus aureus - MIC*    CIPROFLOXACIN >=8 RESISTANT Resistant     ERYTHROMYCIN <=0.25 SENSITIVE Sensitive     GENTAMICIN <=0.5 SENSITIVE Sensitive     OXACILLIN >=4 RESISTANT Resistant     TETRACYCLINE <=1 SENSITIVE Sensitive     VANCOMYCIN 1 SENSITIVE Sensitive     TRIMETH/SULFA <=10 SENSITIVE Sensitive     CLINDAMYCIN <=0.25 SENSITIVE Sensitive     RIFAMPIN <=0.5 SENSITIVE Sensitive     Inducible Clindamycin NEGATIVE Sensitive     * RARE METHICILLIN RESISTANT STAPHYLOCOCCUS AUREUS  Fungus Culture Result     Status: None   Collection Time: 04/11/18  7:38 PM  Result Value Ref Range Status   Result 1 Comment  Final    Comment: (NOTE) KOH/Calcofluor preparation:  no fungus observed. Performed At: [facility] [place], [place] [id number] Rush Farmer MD [id number] Performed at [facility], [place] [address] [place], [place] [address]   Fungus Culture With Stain     Status: None (Preliminary result)   Collection Time: 04/11/18  7:41 PM  Result Value Ref Range Status   Fungus Stain Final report  Final    Comment: (NOTE) Performed At: [facility] [place], [place] [id number] Rush Farmer MD [id number]     Fungus (Mycology) Culture PENDING  Incomplete   Fungal Source FOOT  Final    Comment: RIGHT Performed at [facility], [place] [address] [place], [place] [address]   Aerobic/Anaerobic Culture (surgical/deep wound)     Status: None   Collection Time: 04/11/18  7:41 PM  Result Value Ref Range Status   Specimen Description   Final    WOUND FOOT RIGHT Performed at [place] [address] [place], [place] [address]    Special Requests   Final    NONE Performed at [facility], [place] [address], [place], [place] [address]    Gram Stain   Final    RARE WBC PRESENT, PREDOMINANTLY PMN NO ORGANISMS SEEN    Culture   Final    FEW GROUP B STREP(S.AGALACTIAE)ISOLATED TESTING AGAINST S. AGALACTIAE NOT ROUTINELY PERFORMED DUE TO PREDICTABILITY OF AMP/PEN/VAN SUSCEPTIBILITY. NO ANAEROBES ISOLATED Performed at [facility], [place] [address], [place], [place] [address]    Report Status 04/16/2018 FINAL  Final  Fungus Culture Result     Status: None   Collection Time: 04/11/18  7:41 PM  Result Value Ref Range Status   Result 1 Comment  Final    Comment: (NOTE)  KOH/Calcofluor preparation:  no fungus observed. Performed At: [facility] [place], [place] [id number] Rush Farmer MD [id number] Performed at [facility], [place] [address] [place], [place] [address]   Culture, blood (routine x 2)     Status: None (Preliminary result)   Collection Time: 04/12/18 12:01 AM  Result Value Ref Range Status   Specimen Description   Final    BLOOD RIGHT ANTECUBITAL Performed at [place] [address] [place], [place] [address]    Special Requests   Final    BOTTLES DRAWN AEROBIC AND ANAEROBIC Blood Culture adequate volume Performed at [facility] [place] [address] [place], [place] [address]    Culture   Final    NO GROWTH 4 DAYS Performed at [facility], [place] [address] [place], [place] [address]    Report Status PENDING  Incomplete  Culture, blood (routine x 2)     Status: None (Preliminary result)   Collection Time: 04/12/18 12:01 AM  Result Value Ref Range Status   Specimen Description   Final    BLOOD LEFT HAND Performed at [place] [address], [place], [place] [address]    Special Requests   Final    BOTTLES DRAWN AEROBIC AND ANAEROBIC Blood Culture adequate volume Performed at [place] [address], [place], [place] [address]    Culture   Final    NO GROWTH 4 DAYS Performed at [facility], [place] [address], [place],  [address]    Report Status PENDING  Incomplete       Today   Subjective:   Shanasia Ibrahim No nausea, no vomiting, no fever or chills,reports stump pain    Objective:   Blood pressure 129/87, pulse 74, temperature 98.3 F (36.8 C), temperature source Oral, resp. rate 18, height 5\' 5"  (1.651 m), weight 96 kg (211 lb 10.3 oz), SpO2 98 %.   Intake/Output Summary (Last 24 hours) at 04/16/2018 1701 Last data filed at 04/16/2018 1014 [place] per 24 hour  Intake 100 ml  Output -  Net 100 ml    Exam Awake alert oriented 3, laying in bed in no apparent distress Lungs clear to auscultation, good air entry bilaterally Cardiovascular system: S1 & S2 heard, RRR.   Abdomen soft, nontender, nondistended, bowel sounds present Central nervous system: Alert and oriented. No focal neurological deficits. 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 Skin: No rashes or ulcers Psychiatry:  Mood & affect appropriate.     Data Review   CBC w Diff:  Lab Results  Component Value Date   WBC 12.4 (H) 04/14/2018   HGB 10.6 (L) 04/14/2018   HGB 12.7 09/08/2017   HCT 33.4 (L) 04/14/2018   HCT 37.6 09/08/2017   PLT 304 04/14/2018   PLT 267 09/08/2017   LYMPHOPCT 2 04/09/2018   LYMPHOPCT 30.1  09/08/2017   MONOPCT 5 04/09/2018   MONOPCT 3.6 09/08/2017   EOSPCT 0 04/09/2018   EOSPCT 1.4 09/08/2017   BASOPCT 0 04/09/2018   BASOPCT 0.9 09/08/2017    CMP:  Lab Results  Component Value Date   NA 138 04/14/2018   NA 137 09/08/2017   K 4.0 04/14/2018   K 3.8 09/08/2017   CL 105 04/14/2018   CO2 25 04/14/2018   CO2 23 09/08/2017   BUN <5 (L) 04/14/2018   BUN 6.3 (L) 09/08/2017  CREATININE 0.51 04/16/2018   CREATININE 0.7 09/08/2017   PROT 6.0 (L) 04/10/2018   PROT 6.6 09/08/2017   ALBUMIN 2.4 (L) 04/10/2018   ALBUMIN 3.1 (L) 09/08/2017   BILITOT 0.5 04/10/2018   BILITOT <0.22 09/08/2017   ALKPHOS 87 04/10/2018   ALKPHOS 92 09/08/2017   AST 10 (L) 04/10/2018   AST 8 09/08/2017   ALT 11 (L) 04/10/2018   ALT 6 09/08/2017  .   Total Time in preparing paper work, data evaluation and todays exam - 29 minutes  Phillips Climes M.D on 04/16/2018 at 5:01 PM  [facility]   Office  [phone]

## 2018-04-16 NOTE — Progress Notes (Signed)
Inpatient Rehabilitation  I have a bed to offer patient today, have medical clearance, and plan to proceed with admission today.  Notified team, call if questions.   Carmelia Roller., CCC/SLP Admission Coordinator  [place]  Cell [phone]

## 2018-04-16 NOTE — PMR Pre-admission (Signed)
PMR Admission Coordinator Pre-Admission Assessment  Patient: [name] is an 52 y.o., female MRN: [id number] DOB: [birth year] Height: 5\' 5"  (165.1 cm) Weight: 96 kg (211 lb 10.3 oz)              Insurance Information HMO:     PPO:      PCP:      IPA:      80/20:      OTHER:  PRIMARY: Medicare A & B      Policy#: [id number]      Subscriber: Self CM Name:       Phone#:      Fax#:  Pre-Cert#: Eligible       Employer: Disabled  Benefits:  Phone #: Verified online      Name: [employer]  Eff. Date: 10/27/95     Deduct: $1364      Out of Pocket Max: N/A      Life Max: N/A CIR: 100%       SNF: 100% days 1-20, 80% days 21-100 Outpatient: 80%     Co-Pay: 20% Home Health: 100%      Co-Pay: $0 DME: 80%     Co-Pay: 20% Providers: In-network   SECONDARY: Medicaid of [place]      Policy#: [id number]      Subscriber: Self CM Name:       Phone#:      Fax#:  Pre-Cert#: Coverage code:[id number]      Employer: Disabled  Benefits:  Phone #: [phone]     Name: Verified via automated system   Eff. Date: Eligible 04/16/48     Deduct:       Out of Pocket Max:       Life Max:  CIR:       SNF:  Outpatient:      Co-Pay:  Home Health:       Co-Pay:  DME:      Co-Pay:   Medicaid Application Date:       Case Manager:  Disability Application Date:       Case Worker:   Emergency Contact Information Contact Information    Name Relation Home Work Mobile   Smith,David Significant other   [phone]   Antonietta Breach   [phone]   Thomes Cake [phone]       Current Medical History  Patient Admitting Diagnosis: right BKA  History of Present Illness: [name] is a 52 y.o. female with history of COPD, OSA, poorly controlled DM with insensate neuropathy and retinopathy, ongoing tobacco use-3-4 PPD, BLE edema, charcot foot who was admitted via ED on 04/09/18 with weakness, mental status changes and worsening of foot wound. She was found to have step agalactiae sepsis and was started  on IV antibiotics for treatment. MRI foot revealed early osteomyelitis with abscess and she underwent I & D  Right foot wound without improvement in healing.  Dr. Sharol Given was consulted for input and recommended BKA. She underwent BKA on 04/19 with recommendations for tobacco cessation and better diabetes control. Therapy evaluation done this weekend and CIR recommended due to functional deficits.  Patient admitted to IP Rehab 04/16/18.          Past Medical History  Past Medical History:  Diagnosis Date  . Anxiety   . Arthritis   . Bipolar disorder ([place])   . Broken jaw ([place])   . Broken wrist   . Chronic back pain   . Claudication [facility])    a. 12/2013  ABI's: R 0.97, L 0.94.  [name] COPD (chronic obstructive pulmonary disease) ([place])   . Depression   . Diabetic Charcot's foot ([place])   . Diabetic foot ulcer ([facility])    chronic left foot ulcer , great toe/  hx recurrent foot ulcer bilaterally  . DJD (degenerative joint disease)   . Edema of both lower extremities   . Episode of memory loss   . GERD (gastroesophageal reflux disease)   . Headache(784.0)   . Hiatal hernia   . History of colon cancer, stage I dx 02-26-2015--- oncologist-- dr [name]--- per last note no recurrence   05-16-2015 s/p  Laparoscopic sigmoid colectomy w/ node bx's (negative nodes per path)  Stage I (pT1,N0,M0) Grade 2  . 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   per MRI multiple small strokes post cardiac cath. --  residual mild memory loss  . History of methicillin resistant staphylococcus aureus (MRSA)   . Hypercholesterolemia   . Insomnia   . Insulin dependent type 2 diabetes mellitus, uncontrolled ([place]) dx 2004   endocrinologist-  dr Dwyane Dee-- last A1c 8.8 in Aug2018:  pt is noncompliant w/ diet, states does note eat breakfast , her first main meal in afternoon  . Neurogenic bladder   . Neuropathy, diabetic ([place])    hands and feet  . OSA (obstructive sleep apnea)    cpap intolerant  .  Personality disorder ([place])   . SOB (shortness of breath) on exertion   . Stroke ([place])   . SUI (stress urinary incontinence, female) S/P SLING 12-29-2011    Family History  family history includes Cancer - Other in her brother, father, and mother; Diabetes in her mother; Heart attack in her father; Hypertension in her mother.  Prior Rehab/Hospitalizations:  Has the patient had major surgery during 100 days prior to admission? Yes  Current Medications   Current Facility-Administered Medications:  .  0.9 %  sodium chloride infusion, , Intravenous, Continuous, Newt Minion, MD, Last Rate: 10 mL/hr at 04/13/18 1245 .  acetaminophen (TYLENOL) tablet 325-650 mg, 325-650 mg, Oral, Q6H PRN, Newt Minion, MD, 650 mg at 04/15/18 2247 .  albuterol (PROVENTIL) (2.5 MG/3ML) 0.083% nebulizer solution 2.5 mg, 2.5 mg, Nebulization, Q6H PRN, Newt Minion, MD, 2.5 mg at 04/11/18 1558 .  ampicillin (OMNIPEN) 2 g in sodium chloride 0.9 % 100 mL IVPB, 2 g, Intravenous, Q4H, Newt Minion, MD, Stopped at 04/16/18 1538 .  bisacodyl (DULCOLAX) EC tablet 5 mg, 5 mg, Oral, Daily PRN, Newt Minion, MD .  bisacodyl (DULCOLAX) suppository 10 mg, 10 mg, Rectal, Daily PRN, Newt Minion, MD .  Chlorhexidine Gluconate Cloth 2 % PADS 6 each, 6 each, Topical, Q0600, Newt Minion, MD, 6 each at 04/16/18 0532 .  docusate sodium (COLACE) capsule 100 mg, 100 mg, Oral, BID, Newt Minion, MD, 100 mg at 04/16/18 0942 .  enoxaparin (LOVENOX) injection 40 mg, 40 mg, Subcutaneous, Q24H, Newt Minion, MD, 40 mg at 04/16/18 0944 .  gabapentin (NEURONTIN) capsule 600 mg, 600 mg, Oral, BID, Newt Minion, MD, 600 mg at 04/16/18 0942 .  guaiFENesin-dextromethorphan (ROBITUSSIN DM) 100-10 MG/5ML syrup 5 mL, 5 mL, Oral, Q4H PRN, Newt Minion, MD, 5 mL at 04/14/18 1709 .  insulin aspart (novoLOG) injection 0-15 Units, 0-15 Units, Subcutaneous, Q4H, Newt Minion, MD, 3 Units at 04/16/18 1251 .  insulin glargine (LANTUS)  injection 24 Units, 24 Units, Subcutaneous, QAC breakfast, Elgergawy, Emeline Gins  S, MD, 24 Units at 04/16/18 0940 .  ipratropium (ATROVENT) nebulizer solution 0.5 mg, 0.5 mg, Nebulization, Q6H PRN, Newt Minion, MD .  lactated ringers infusion, , Intravenous, Continuous, Newt Minion, MD, Stopped at 04/13/18 1244 .  lidocaine (LIDODERM) 5 % 1 patch, 1 patch, Transdermal, Daily PRN, Newt Minion, MD .  lip balm (CARMEX) ointment, , Topical, PRN, Newt Minion, MD .  magnesium citrate solution 1 Bottle, 1 Bottle, Oral, Once PRN, Newt Minion, MD .  methocarbamol (ROBAXIN) tablet 500 mg, 500 mg, Oral, Q6H PRN, 500 mg at 04/16/18 0949 **OR** methocarbamol (ROBAXIN) 500 mg in dextrose 5 % 50 mL IVPB, 500 mg, Intravenous, Q6H PRN, Newt Minion, MD .  metoCLOPramide (REGLAN) tablet 5-10 mg, 5-10 mg, Oral, Q8H PRN **OR** metoCLOPramide (REGLAN) injection 5-10 mg, 5-10 mg, Intravenous, Q8H PRN, Newt Minion, MD .  mupirocin ointment (BACTROBAN) 2 % 1 application, 1 application, Nasal, BID, Newt Minion, MD, 1 application at 62/13/08 [phone] .  nicotine (NICODERM CQ - dosed in mg/24 hours) patch 21 mg, 21 mg, Transdermal, Daily, Newt Minion, MD, 21 mg at 04/16/18 0943 .  nystatin cream (MYCOSTATIN), , Topical, BID, Rizwan, Saima, MD .  ondansetron (ZOFRAN) tablet 4 mg, 4 mg, Oral, Q6H PRN **OR** ondansetron (ZOFRAN) injection 4 mg, 4 mg, Intravenous, Q6H PRN, Newt Minion, MD .  oxyCODONE (Oxy IR/ROXICODONE) immediate release tablet 10-15 mg, 10-15 mg, Oral, Q4H PRN, Newt Minion, MD, 15 mg at 04/16/18 0949 .  oxyCODONE (Oxy IR/ROXICODONE) immediate release tablet 5-10 mg, 5-10 mg, Oral, Q4H PRN, Newt Minion, MD, 10 mg at 04/14/18 1316 .  oxyCODONE-acetaminophen (PERCOCET/ROXICET) 5-325 MG per tablet 1-2 tablet, 1-2 tablet, Oral, Q6H PRN, Newt Minion, MD, 2 tablet at 04/16/18 0156 .  polyethylene glycol (MIRALAX / GLYCOLAX) packet 17 g, 17 g, Oral, Daily PRN, Newt Minion, MD .   QUEtiapine (SEROQUEL) tablet 600 mg, 600 mg, Oral, QHS, Newt Minion, MD, 600 mg at 04/15/18 2248 .  rOPINIRole (REQUIP) tablet 2 mg, 2 mg, Oral, QHS, Newt Minion, MD, 2 mg at 04/15/18 2249 .  senna-docusate (Senokot-S) tablet 1 tablet, 1 tablet, Oral, QHS PRN, Newt Minion, MD .  sodium chloride flush (NS) 0.9 % injection 3 mL, 3 mL, Intravenous, Q12H, Newt Minion, MD, 3 mL at 04/16/18 0944  Patients Current Diet: Fall precautions Diet Carb Modified Fluid consistency: Thin; Room service appropriate? Yes  Precautions / Restrictions Precautions Precautions: Fall Restrictions Weight Bearing Restrictions: Yes RLE Weight Bearing: Non weight bearing LLE Weight Bearing: Weight bearing as tolerated Other Position/Activity Restrictions: unsure of WB status at this time. Await further recs from MD   Has the patient had 2 or more falls or a fall with injury in the past year?Yes  Prior Activity Level Limited Community (1-2x/wk): Prior to admission patient got out when her husband could drive her.  He also helped manage her medications and medical visits.   Home Assistive Devices / Equipment Home Assistive Devices/Equipment: None Home Equipment: Walker - 4 wheels  Prior Device Use: Indicate devices/aids used by the patient prior to current illness, exacerbation or injury? Special shoes   Prior Functional Level Prior Function Level of Independence: Independent Comments: per husband, pt was independent with mobility prior to admission  Self Care: Did the patient need help bathing, dressing, using the toilet or eating? Independent  Indoor Mobility: Did the patient need assistance with walking from room to  room (with or without device)? Independent  Stairs: Did the patient need assistance with internal or external stairs (with or without device)? Independent  Functional Cognition: Did the patient need help planning regular tasks such as shopping or remembering to take medications?  Needed some help  Current Functional Level Cognition  Overall Cognitive Status: Impaired/Different from baseline Current Attention Level: Sustained Orientation Level: Oriented X4 Safety/Judgement: Decreased awareness of safety, Decreased awareness of deficits General Comments: Pt drowsy throughout session, will awaken to her name or activity but shortly falls back asleep. Does not recall who worked with her yesterday or how to don residual limb protector.    Extremity Assessment (includes Sensation/Coordination)  Upper Extremity Assessment: Overall WFL for tasks assessed(strong! )  Lower Extremity Assessment: RLE deficits/detail, LLE deficits/detail RLE Deficits / Details: new BKA - residual limb protector used during session RLE Sensation: history of peripheral neuropathy RLE Coordination: decreased gross motor LLE Deficits / Details: dressing on foot; history of multiple knee sx LLE Sensation: history of peripheral neuropathy    ADLs  Overall ADL's : Needs assistance/impaired Eating/Feeding: Modified independent Grooming: Set up, Wash/dry hands, Wash/dry face, Sitting Grooming Details (indicate cue type and reason): in recliner Upper Body Bathing: Set up, Sitting Lower Body Bathing: Minimal assistance Upper Body Dressing : Set up, Sitting Lower Body Dressing: Maximal assistance, Sitting/lateral leans Lower Body Dressing Details (indicate cue type and reason): able to do chair push up to don mesh underwear in recliner with lateral leans; max A to don residual limb guard Toilet Transfer: Minimal assistance, Stand-pivot, BSC, RW Toilet Transfer Details (indicate cue type and reason): pushes up on RW with stability from OT Toileting- Clothing Manipulation and Hygiene: Set up, Sitting/lateral lean Functional mobility during ADLs: Minimal assistance, Rolling walker, +2 for safety/equipment(SPT only) General ADL Comments: cognition impacting ability to perform ADL     Mobility  Overal  bed mobility: Needs Assistance Bed Mobility: Supine to Sit Supine to sit: Supervision Sit to supine: Supervision General bed mobility comments: Pt performed bed mobility with cues for safety to avoid pulling on lines/leads.      Transfers  Overall transfer level: Needs assistance Equipment used: Rolling walker (2 wheeled) Transfers: Sit to/from Stand Sit to Stand: Supervision Stand pivot transfers: Min assist General transfer comment: Cues for safe hand placement, patient tends to pull up on RW vs. pushing from seated surface.      Ambulation / Gait / Stairs / Wheelchair Mobility  Ambulation/Gait Ambulation/Gait assistance: Min guard, Min assist Ambulation Distance (Feet): 20 Feet(+ 10 ft.  ) Assistive device: Rolling walker (2 wheeled) Gait Pattern/deviations: Step-to pattern(hop to pattern with flexed posture.  ) General Gait Details: cues for posture and safe proximity to RW; adjusted RW height to improve leverage.  Pt fatigues quickly.   Gait velocity: decreased    Posture / Balance Balance Overall balance assessment: Needs assistance Sitting-balance support: No upper extremity supported, Feet supported Sitting balance-Leahy Scale: Good Standing balance support: Bilateral upper extremity supported Standing balance-Leahy Scale: Fair Standing balance comment: Reliant on RW for balance     Special needs/care consideration BiPAP/CPAP: No CPM: No Continuous Drip IV: No Dialysis: No        Life Vest: No Oxygen: None PTA Special Bed: No Trach Size: No Wound Vac (area): Yes      Location: Rt. BKA Skin: MASD to vagina; Cellulitis left foot  Bowel mgmt:  Bladder mgmt: Continent, last BM 04/15/18 Diabetic mgmt: Yes, with CBG checks, oral, and insulin per report      Previous Home Environment Living Arrangements: Spouse/significant other Available Help at Discharge: Family Type of Home: House Home Layout: One level Home Access: Ramped entrance,  Stairs to enter Entrance Stairs-Rails: None Entrance Stairs-Number of Steps: 2 Bathroom Shower/Tub: Optometrist: No Home Care Services: Yes Type of Home Care Services: [place] (if known): incompass  Discharge Living Setting Plans for Discharge Living Setting: Patient's home, Lives with (comment)(Spouse) Type of Home at Discharge: House Discharge Home Layout: One level, Other (Comment)(with 1 step down into bedroom  ) Discharge Home Access: Bland entrance Discharge Bathroom Shower/Tub: Other (comment)(bathed at the sink due to deep jacuzzi tub) Discharge Bathroom Toilet: Standard Discharge Bathroom Accessibility: Yes How Accessible: Accessible via walker Does the patient have any problems obtaining your medications?: No  Social/Family/Support Systems Patient Roles: Spouse Contact Information: She refers to him as "Spouse" but significant other: Etta Quill  Anticipated Caregiver: Shanon Brow  Anticipated Caregiver's Contact Information: see above  Ability/Limitations of Caregiver: None Caregiver Availability: 24/7 Discharge Plan Discussed with Primary Caregiver: Yes(Discussed with patient, Mod I goals ) Is Caregiver In Agreement with Plan?: Yes(Patient is, Mod I goals ) Does Caregiver/Family have Issues with Lodging/Transportation while Pt is in Rehab?: No  Goals/Additional Needs Patient/Family Goal for Rehab: PT/OT: Mod I Expected length of stay: 7-10 days  Cultural Considerations: She doesn't take off her wedding band per her report  Dietary Needs: Carb. Mod. diet restrictions, but patient reports allergy to artifical sweetners. Medical team please clarify  Equipment Needs: TBD Pt/Family Agrees to Admission and willing to participate: Yes Program Orientation Provided & Reviewed with Pt/Caregiver Including Roles  & Responsibilities: Yes Additional Information Needs: See Dr.Harkins for pain management  PTA Information Needs to be Provided By: Team FYI  Decrease burden of Care through IP rehab admission: No   Possible need for SNF placement upon discharge: No   Patient Condition: This patient's condition remains as documented in the consult dated 04/16/18, in which the Rehabilitation Physician determined and documented that the patient's condition is appropriate for intensive rehabilitative care in an inpatient rehabilitation facility. Will admit to inpatient rehab today.  Preadmission Screen Completed By:  Gunnar Fusi, 04/16/2018 4:54 PM ______________________________________________________________________   Discussed status with Dr. Naaman Plummer on 04/16/18 at 24 and received telephone approval for admission today.  Admission Coordinator:  Gunnar Fusi, time 1700/Date 04/16/18

## 2018-04-16 NOTE — Progress Notes (Signed)
Patient ID: [name], female   DOB: 06/18/1966, 52 y.o.   MRN: [id number] Postoperative day 2 transtibial amputation.  Both physical therapy and occupational therapy have recommended inpatient rehabilitation.  This should give patient's husband time to have her home ready for discharge to home and patient safe for discharge to home.

## 2018-04-16 NOTE — Telephone Encounter (Signed)
Error

## 2018-04-16 NOTE — Progress Notes (Signed)
This RN gave phone report to Janace Hoard, RN from inpatient rehab. All questions answered to satisfaction. This RN will transfer pt via bed to room [facility]. Husband currently at bedside.

## 2018-04-16 NOTE — Progress Notes (Signed)
PROGRESS NOTE    [name] PLACZEK   QIH:[id number]  DOB: [birth year]  DOA: 04/09/2018 PCP: Tamsen Roers, MD   Brief Narrative:  [name]  is a 52 y.o. female with a known history of DM, COPD, GERD, OSA , CVA, HLD and insomnia presents to the emergency department for evaluation of AMS.  Patient was in a usual state of health until two days ago when she became confused & weak.  Husband states that she has had worsening of a wound on the bottom of her right foot associated with Charcot deformity and is scheduled to see podiatry for procedure on 4/22.  States right lower extremity is red, swollen and wound is draining.  He initially attributed her AMS to a new sleeping pill for her insomnia.  In the [facility] ED the patient was awake, arousable but did not answer questions appropriately.  Her podiatrist, Dr March Rummage was consulted and she was taken for an I and D on 4/17. Was febrile up to 101.4 at midnight. Noted to have blood cultures post for Strep Agalactiae.  Dr March Rummage recommended an ortho consult on 3/18 for a BKA. She was transferred to [facility] today and underwent right BKA  by Dr Sharol Given.   Subjective: No nausea, no vomiting, no fever or chills,reports stump pain  Assessment & Plan:   Severe sepsis/ strep agalactiae wound infection and subsequent bacteremia  Cutaneous abscess of right foot Subacute osteomyelitis, right ankle and foot  - I&D on 4/17. Was febrile up to 101.4 at midnight. Noted to have blood cultures post for Strep Agalactiae. ,Dr March Rummage recommended an ortho consult on 4/18 for a BKA. She was transferred to [facility] and underwent right BKA by Dr Sharol Given On 04/19/202. - Dr Baxter Flattery originally recommended 4 wks of Ampicillin, however, as an amputation has been done, 10-14 days to cover for bacteremia might be sufficient- have asked ID for new recommendations -dr. Wynelle Cleveland have spoken with Dr Baxter Flattery  ID recommends 10 days of antibiotics after neg blood cultures - can switch to Amoxicillin on  d/C. - repeat blood cultures 04/12/2018 remains negative to date, 2-D echo with no evidence of vegetation. - orthopedic input greatly appreciated, regarding follow-up today they recommend new 1 vac pump, patient has stump protector from [employer]. - she will also go with a Prevena plus portable wound vac from the hospital and will need to f/u 1 wk after the surgery   Cigarette smoker/ COPD - nicotine patch- needs to stop smoking - Albutero, Atrovent l PRN  DM2 uncontrolled with neuropathy-  - A1c 9.2 on 3/19,no further hypoglycemia after lowering her Lantus from 36-24 units - Jardiance and Invokana on hold - cont Neurontin   Major depressive disorder, recurrent, mild Polypharmacy  - numerous home meds on hold   - psych evaluated  the patient on 4/17 and recommended to start Celexa 10 mg daily and Hydroxyzine 25 mg TID PRN for anxiety   Hypokalemia - replaced   vaginal rash , - fungal, on Nystatin.she was given Diflucan yesterday  DVT prophylaxis: Lovenox Code Status: Full code Family Communication: discussed with husband via phone  Disposition Plan: CIR Consultants:   Podiatry  Ortho  ID Procedures:   Right foot I and D  Right BKA Antimicrobials:  Anti-infectives (From admission, onward)   Start     Dose/Rate Route Frequency Ordered Stop   04/15/18 0715  fluconazole (DIFLUCAN) tablet 150 mg     150 mg Oral  Once 04/15/18 0708 04/15/18 0840  04/13/18 1500  ceFAZolin (ANCEF) IVPB 1 g/50 mL premix     1 g 100 mL/hr over 30 Minutes Intravenous Every 6 hours 04/13/18 1230 04/14/18 0444   04/13/18 0815  ceFAZolin (ANCEF) IVPB 2g/100 mL premix     2 g 200 mL/hr over 30 Minutes Intravenous On call to O.R. 04/13/18 0804 04/13/18 0915   04/12/18 1400  ampicillin (OMNIPEN) 2 g in sodium chloride 0.9 % 100 mL IVPB     2 g 300 mL/hr over 20 Minutes Intravenous Every 4 hours 04/12/18 1250     04/12/18 0200  vancomycin (VANCOCIN) 1,500 mg in sodium chloride 0.9 % 500 mL IVPB   Status:  Discontinued     1,500 mg 250 mL/hr over 120 Minutes Intravenous Daily 04/12/18 0143 04/12/18 1023   04/11/18 1845  vancomycin (VANCOCIN) powder 500 mg     500 mg Other To Surgery 04/11/18 1837 04/11/18 1916   04/11/18 1600  cefTRIAXone (ROCEPHIN) 2 g in sodium chloride 0.9 % 100 mL IVPB  Status:  Discontinued     2 g 200 mL/hr over 30 Minutes Intravenous Every 24 hours 04/11/18 1102 04/12/18 1246   04/11/18 1600  metroNIDAZOLE (FLAGYL) IVPB 500 mg  Status:  Discontinued     500 mg 100 mL/hr over 60 Minutes Intravenous Every 8 hours 04/11/18 1102 04/12/18 1246   04/10/18 2000  vancomycin (VANCOCIN) 1,500 mg in sodium chloride 0.9 % 500 mL IVPB  Status:  Discontinued     1,500 mg 250 mL/hr over 120 Minutes Intravenous Every 24 hours 04/09/18 2121 04/11/18 1102   04/09/18 2359  piperacillin-tazobactam (ZOSYN) IVPB 3.375 g     3.375 g 12.5 mL/hr over 240 Minutes Intravenous Every 8 hours 04/09/18 2121 04/11/18 1130   04/09/18 2315  piperacillin-tazobactam (ZOSYN) IVPB 3.375 g  Status:  Discontinued     3.375 g 100 mL/hr over 30 Minutes Intravenous  Once 04/09/18 2311 04/09/18 2316   04/09/18 2315  vancomycin (VANCOCIN) IVPB 1000 mg/200 mL premix  Status:  Discontinued     1,000 mg 200 mL/hr over 60 Minutes Intravenous  Once 04/09/18 2311 04/09/18 2317   04/09/18 1815  vancomycin (VANCOCIN) 2,000 mg in sodium chloride 0.9 % 500 mL IVPB     2,000 mg 250 mL/hr over 120 Minutes Intravenous STAT 04/09/18 1813 04/09/18 2150   04/09/18 1800  piperacillin-tazobactam (ZOSYN) IVPB 3.375 g     3.375 g 100 mL/hr over 30 Minutes Intravenous  Once 04/09/18 1757 04/09/18 1932   04/09/18 1800  vancomycin (VANCOCIN) IVPB 1000 mg/200 mL premix  Status:  Discontinued     1,000 mg 200 mL/hr over 60 Minutes Intravenous  Once 04/09/18 1757 04/09/18 1813       Objective: Vitals:   04/15/18 1300 04/15/18 2008 04/16/18 0355 04/16/18 1146  BP: 113/60 134/67 (!) 100/54 126/66  Pulse: 82 87 75 80    Resp: 19  16 18   Temp: 98.5 F (36.9 C) 98.1 F (36.7 C) 98.2 F (36.8 C) 97.9 F (36.6 C)  TempSrc: Oral Oral Oral Oral  SpO2: 94% 97% (!) 87% 95%  Weight:      Height:        Intake/Output Summary (Last 24 hours) at 04/16/2018 1305 Last data filed at 04/16/2018 1014 Gross per 24 hour  Intake 340 ml  Output -  Net 340 ml   Filed Weights   04/09/18 2251 04/11/18 1700 04/13/18 0820  Weight: 96.2 kg (212 lb 1.3 oz) 96 kg (211 lb  10.3 oz) 96 kg (211 lb 10.3 oz)    Examination:  Awake alert oriented 3, laying in bed in no apparent distress Lungs clear to auscultation, good air entry bilaterally Cardiovascular system: S1 & S2 heard, RRR.   Abdomen soft, nontender, nondistended, bowel sounds present Central nervous system: Alert and oriented. No focal neurological deficits. Extremities: right BKA , with stump protector and shrinker, connected to one pack Skin: No rashes or ulcers Psychiatry:  Mood & affect appropriate.     Data Reviewed: I have personally reviewed following labs and imaging studies  CBC: Recent Labs  Lab 04/09/18 1828 04/10/18 0542 04/12/18 0553 04/13/18 0555 04/14/18 0708  WBC 30.3* 22.8* 12.6* 13.7* 12.4*  NEUTROABS 28.2*  --   --   --   --   HGB 13.0 10.5* 10.1* 10.8* 10.6*  HCT 39.1 32.0* 30.8* 34.4* 33.4*  MCV 95.1 96.1 96.0 96.9 96.3  PLT 261 221 232 293 671   Basic Metabolic Panel: Recent Labs  Lab 04/09/18 1828 04/10/18 0542 04/11/18 0553 04/12/18 0553 04/13/18 0555 04/14/18 0708 04/16/18 0514  NA 131* 132*  --  138 140 138  --   K 4.6 3.8  --  3.2* 3.3* 4.0  --   CL 90* 97*  --  106 107 105  --   CO2 25 25  --  23 24 25   --   GLUCOSE 385* 166*  --  161* 116* 111*  --   BUN 15 14  --  6 7 <5*  --   CREATININE 1.05* 0.84 0.64 0.58 0.58 0.60 0.51  CALCIUM 9.5 8.1*  --  8.2* 8.4* 8.5*  --   MG  --   --   --  1.7 1.8  --   --    GFR: Estimated Creatinine Clearance: 94.3 mL/min (by C-G formula based on SCr of 0.51  mg/dL). Liver Function Tests: Recent Labs  Lab 04/09/18 1828 04/10/18 0542  AST 16 10*  ALT 16 11*  ALKPHOS 110 87  BILITOT 0.3 0.5  PROT 7.6 6.0*  ALBUMIN 3.3* 2.4*   No results for input(s): LIPASE, AMYLASE in the last 168 hours. No results for input(s): AMMONIA in the last 168 hours. Coagulation Profile: No results for input(s): INR, PROTIME in the last 168 hours. Cardiac Enzymes: No results for input(s): CKTOTAL, CKMB, CKMBINDEX, TROPONINI in the last 168 hours. BNP (last 3 results) No results for input(s): PROBNP in the last 8760 hours. HbA1C: No results for input(s): HGBA1C in the last 72 hours. CBG: Recent Labs  Lab 04/15/18 2022 04/16/18 0032 04/16/18 0438 04/16/18 0851 04/16/18 1142  GLUCAP 202* 214* 83 169* 178*   Lipid Profile: No results for input(s): CHOL, HDL, LDLCALC, TRIG, CHOLHDL, LDLDIRECT in the last 72 hours. Thyroid Function Tests: No results for input(s): TSH, T4TOTAL, FREET4, T3FREE, THYROIDAB in the last 72 hours. Anemia Panel: No results for input(s): VITAMINB12, FOLATE, FERRITIN, TIBC, IRON, RETICCTPCT in the last 72 hours. Urine analysis:    Component Value Date/Time   COLORURINE YELLOW 04/09/2018 1828   APPEARANCEUR HAZY (A) 04/09/2018 1828   LABSPEC 1.022 04/09/2018 1828   PHURINE 6.0 04/09/2018 1828   GLUCOSEU >=500 (A) 04/09/2018 1828   GLUCOSEU >=1000 (A) 01/07/2015 1410   HGBUR NEGATIVE 04/09/2018 1828   BILIRUBINUR NEGATIVE 04/09/2018 1828   KETONESUR NEGATIVE 04/09/2018 1828   PROTEINUR NEGATIVE 04/09/2018 1828   UROBILINOGEN 0.2 04/23/2015 2355   NITRITE NEGATIVE 04/09/2018 1828   LEUKOCYTESUR SMALL (A) 04/09/2018 1828  Sepsis Labs: @LABRCNTIP (procalcitonin:4,lacticidven:4) ) Recent Results (from the past 240 hour(s))  Blood Culture (routine x 2)     Status: Abnormal   Collection Time: 04/09/18  6:28 PM  Result Value Ref Range Status   Specimen Description   Final    BLOOD BLOOD LEFT FOREARM Performed at [facility], [place] [address] [place], [place] [address]    Special Requests   Final    BOTTLES DRAWN AEROBIC AND ANAEROBIC Blood Culture adequate volume Performed at [facility] [address] [place], [place] [address]    Culture  Setup Time   Final    GRAM POSITIVE COCCI IN PAIRS AND CHAINS ANAEROBIC BOTTLE ONLY CRITICAL RESULT CALLED TO, READ BACK BY AND VERIFIED WITH: Marcy Siren PharmD 11:45 04/10/18 ([email or web address]) Performed at [facility], [place] [address], [place], [place] [address]    Culture GROUP B STREP(S.AGALACTIAE)ISOLATED (A)  Final   Report Status 04/12/2018 FINAL  Final   Organism ID, Bacteria GROUP B STREP(S.AGALACTIAE)ISOLATED  Final      Susceptibility   Group b strep(s.agalactiae)isolated - MIC*    CLINDAMYCIN >=1 RESISTANT Resistant     AMPICILLIN <=0.25 SENSITIVE Sensitive     ERYTHROMYCIN >=8 RESISTANT Resistant     VANCOMYCIN 0.5 SENSITIVE Sensitive     CEFTRIAXONE <=0.12 SENSITIVE Sensitive     LEVOFLOXACIN 1 SENSITIVE Sensitive     * GROUP B STREP(S.AGALACTIAE)ISOLATED  Urine culture     Status: Abnormal   Collection Time: 04/09/18  6:28 PM  Result Value Ref Range Status   Specimen Description   Final    URINE, RANDOM Performed at [facility] [address], [place], [place] [address]    Special Requests   Final    URINE, RANDOM Performed at [facility] [place] [address], [place],  [address]    Culture MULTIPLE SPECIES PRESENT, SUGGEST RECOLLECTION (A)  Final   Report Status 04/11/2018 FINAL  Final  Blood Culture ID Panel (Reflexed)     Status: Abnormal   Collection Time: 04/09/18  6:28 PM  Result Value Ref Range Status   Enterococcus species NOT DETECTED NOT DETECTED Final   Listeria monocytogenes NOT DETECTED NOT DETECTED Final   Staphylococcus species NOT DETECTED NOT DETECTED Final   Staphylococcus aureus NOT DETECTED NOT DETECTED Final   Streptococcus species DETECTED  (A) NOT DETECTED Final    Comment: CRITICAL RESULT CALLED TO, READ BACK BY AND VERIFIED WITH: Marcy Siren PharmD 11:45 04/10/18 ([email or web address])    Streptococcus agalactiae DETECTED (A) NOT DETECTED Final    Comment: CRITICAL RESULT CALLED TO, READ BACK BY AND VERIFIED WITH: Marcy Siren PharmD 11:45 04/10/18 ([email or web address])    Streptococcus pneumoniae NOT DETECTED NOT DETECTED Final   Streptococcus pyogenes NOT DETECTED NOT DETECTED Final   Acinetobacter baumannii NOT DETECTED NOT DETECTED Final   Enterobacteriaceae species NOT DETECTED NOT DETECTED Final   Enterobacter cloacae complex NOT DETECTED NOT DETECTED Final   Escherichia coli NOT DETECTED NOT DETECTED Final   Klebsiella oxytoca NOT DETECTED NOT DETECTED Final   Klebsiella pneumoniae NOT DETECTED NOT DETECTED Final   Proteus species NOT DETECTED NOT DETECTED Final   Serratia marcescens NOT DETECTED NOT DETECTED Final   Haemophilus influenzae NOT DETECTED NOT DETECTED Final   Neisseria meningitidis NOT DETECTED NOT DETECTED Final   Pseudomonas aeruginosa NOT DETECTED NOT DETECTED Final   Candida albicans NOT DETECTED NOT DETECTED Final   Candida glabrata NOT DETECTED NOT DETECTED Final  Candida krusei NOT DETECTED NOT DETECTED Final   Candida parapsilosis NOT DETECTED NOT DETECTED Final   Candida tropicalis NOT DETECTED NOT DETECTED Final    Comment: Performed at [facility], [place] [address] [place], [place] [address]  Blood Culture (routine x 2)     Status: None   Collection Time: 04/09/18 11:33 PM  Result Value Ref Range Status   Specimen Description   Final    BLOOD RIGHT ANTECUBITAL Performed at [facility] [address], [place], [place] [address]    Special Requests   Final    BOTTLES DRAWN AEROBIC AND ANAEROBIC Blood Culture adequate volume Performed at [facility] [address] [place], [place] [address]    Culture   Final    NO GROWTH 5 DAYS Performed at [place], [place] [address] [place], [place] [address]    Report Status 04/15/2018 FINAL  Final  Surgical pcr screen     Status: Abnormal   Collection Time: 04/11/18  2:25 PM  Result Value Ref Range Status   MRSA, PCR POSITIVE (A) NEGATIVE Final    Comment: RESULT CALLED TO, READ BACK BY AND VERIFIED WITH: PAM MENTLEY,RN [id number] @ 1731 BY J SCOTTON    Staphylococcus aureus POSITIVE (A) NEGATIVE Final    Comment: (NOTE) The Xpert SA Assay (FDA approved for NASAL specimens in patients 65 years of age and older), is one component of a comprehensive surveillance program. It is not intended to diagnose infection nor to guide or monitor treatment. Performed at [facility], [place] [address], [place], [place] [address]   Fungus Culture With Stain     Status: None (Preliminary result)   Collection Time: 04/11/18  7:09 PM  Result Value Ref Range Status   Fungus Stain Final report  Final    Comment: (NOTE) Performed At: [facility] [place], [place] [id number] Rush Farmer MD QA:[id number]    Fungus (Mycology) Culture PENDING  Incomplete   Fungal Source WOUND  Final    Comment: FOOT RIGHT Performed at [facility], [place] [address], [place], [place] [address]   Aerobic/Anaerobic Culture (surgical/deep wound)     Status: None   Collection Time: 04/11/18  7:09 PM  Result Value Ref Range Status   Specimen Description   Final    WOUND FOOT RIGHT Performed at [place] [address], [place], [place] [address]    Special Requests   Final    NONE Performed at [facility], [place] [address] [place], [place] [address]    Gram Stain   Final    MODERATE WBC PRESENT, PREDOMINANTLY PMN ABUNDANT GRAM POSITIVE COCCI    Culture   Final    MODERATE GROUP B STREP(S.AGALACTIAE)ISOLATED TESTING AGAINST S. AGALACTIAE NOT ROUTINELY PERFORMED DUE TO PREDICTABILITY OF AMP/PEN/VAN SUSCEPTIBILITY. RARE  STAPHYLOCOCCUS AUREUS NO ANAEROBES ISOLATED SUSCEPTIBILITIES PERFORMED ON PREVIOUS CULTURE WITHIN THE LAST 5 DAYS. Performed at [facility], [place] [address] [place], [place] [address]    Report Status 04/16/2018 FINAL  Final  Fungus Culture Result     Status: None   Collection Time: 04/11/18  7:09 PM  Result Value Ref Range Status   Result 1 Comment  Final    Comment: (NOTE) KOH/Calcofluor preparation:  no fungus observed. Performed At: [facility] [place], [place] [id number] Rush Farmer MD OV:[id number] Performed at [facility], [place]  [address] [place], [place] [address]   Fungus Culture With Stain     Status: None (Preliminary result)   Collection Time: 04/11/18  7:38 PM  Result Value Ref Range Status   Fungus Stain Final report  Final    Comment: (NOTE) Performed At: [facility] [place], [place] [id number] Rush Farmer MD [id number]    Fungus (Mycology) Culture PENDING  Incomplete   Fungal Source BONE  Final    Comment: FOOT RIGHT Performed at [facility], [place] [address], [place], [place] [address]   Aerobic/Anaerobic Culture (surgical/deep wound)     Status: None   Collection Time: 04/11/18  7:38 PM  Result Value Ref Range Status   Specimen Description   Final    BONE FOOT RIGHT Performed at [place] [address] [place], [place] [address]    Special Requests   Final    NONE Performed at [facility], [place] [address], [place], [place] [address]    Gram Stain   Final    RARE WBC PRESENT, PREDOMINANTLY PMN FEW GRAM POSITIVE COCCI    Culture   Final    MODERATE GROUP B STREP(S.AGALACTIAE)ISOLATED TESTING AGAINST S. AGALACTIAE NOT ROUTINELY PERFORMED DUE TO PREDICTABILITY OF AMP/PEN/VAN SUSCEPTIBILITY. RARE METHICILLIN RESISTANT STAPHYLOCOCCUS AUREUS NO ANAEROBES ISOLATED Performed at [facility], [place]  [address], [place], [place] [address]    Report Status 04/16/2018 FINAL  Final   Organism ID, Bacteria METHICILLIN RESISTANT STAPHYLOCOCCUS AUREUS  Final      Susceptibility   Methicillin resistant staphylococcus aureus - MIC*    CIPROFLOXACIN >=8 RESISTANT Resistant     ERYTHROMYCIN <=0.25 SENSITIVE Sensitive     GENTAMICIN <=0.5 SENSITIVE Sensitive     OXACILLIN >=4 RESISTANT Resistant     TETRACYCLINE <=1 SENSITIVE Sensitive     VANCOMYCIN 1 SENSITIVE Sensitive     TRIMETH/SULFA <=10 SENSITIVE Sensitive     CLINDAMYCIN <=0.25 SENSITIVE Sensitive     RIFAMPIN <=0.5 SENSITIVE Sensitive     Inducible Clindamycin NEGATIVE Sensitive     * RARE METHICILLIN RESISTANT STAPHYLOCOCCUS AUREUS  Fungus Culture Result     Status: None   Collection Time: 04/11/18  7:38 PM  Result Value Ref Range Status   Result 1 Comment  Final    Comment: (NOTE) KOH/Calcofluor preparation:  no fungus observed. Performed At: [facility] [place], [place] [id number] Rush Farmer MD [id number] Performed at [facility], [place] [address], [place], [place] [address]   Fungus Culture With Stain     Status: None (Preliminary result)   Collection Time: 04/11/18  7:41 PM  Result Value Ref Range Status   Fungus Stain Final report  Final    Comment: (NOTE) Performed At: [facility] [place], [place] [id number] Rush Farmer MD [id number]    Fungus (Mycology) Culture PENDING  Incomplete   Fungal Source FOOT  Final    Comment: RIGHT Performed at [facility], [place] [address], [place], [place] [address]   Aerobic/Anaerobic Culture (surgical/deep wound)     Status: None   Collection Time: 04/11/18  7:41 PM  Result Value Ref Range Status   Specimen Description   Final    WOUND FOOT RIGHT Performed at [place] [address], [place], [place] [address]    Special Requests   Final    NONE Performed at  [facility], [place] [address]  [place], [place] [address]    Gram Stain   Final    RARE WBC PRESENT, PREDOMINANTLY PMN NO ORGANISMS SEEN    Culture   Final    FEW GROUP B STREP(S.AGALACTIAE)ISOLATED TESTING AGAINST S. AGALACTIAE NOT ROUTINELY PERFORMED DUE TO PREDICTABILITY OF AMP/PEN/VAN SUSCEPTIBILITY. NO ANAEROBES ISOLATED Performed at [facility], [place] [address] [place], [place] [address]    Report Status 04/16/2018 FINAL  Final  Fungus Culture Result     Status: None   Collection Time: 04/11/18  7:41 PM  Result Value Ref Range Status   Result 1 Comment  Final    Comment: (NOTE) KOH/Calcofluor preparation:  no fungus observed. Performed At: [facility] [place], [place] [id number] Rush Farmer MD [id number] Performed at [facility], [place] [address] [place], [place] [address]   Culture, blood (routine x 2)     Status: None (Preliminary result)   Collection Time: 04/12/18 12:01 AM  Result Value Ref Range Status   Specimen Description   Final    BLOOD RIGHT ANTECUBITAL Performed at [facility] [address] [place] [address], [place], [place] [address]    Special Requests   Final    BOTTLES DRAWN AEROBIC AND ANAEROBIC Blood Culture adequate volume Performed at [place] [address], [place], [place] [address]    Culture   Final    NO GROWTH 4 DAYS Performed at [facility], [place] [address], [place], [place] [address]    Report Status PENDING  Incomplete  Culture, blood (routine x 2)     Status: None (Preliminary result)   Collection Time: 04/12/18 12:01 AM  Result Value Ref Range Status   Specimen Description   Final    BLOOD LEFT HAND Performed at [place] [address] [place], [place] [address]    Special Requests   Final    BOTTLES DRAWN AEROBIC AND ANAEROBIC Blood Culture adequate volume Performed at [place] [address], [place], [place] [address]    Culture   Final    NO GROWTH 4 DAYS Performed at [facility], [place] [address], [place], [place] [address]    Report Status PENDING  Incomplete         Radiology Studies: No results found.    Scheduled Meds: . Chlorhexidine Gluconate Cloth  6 each Topical Q0600  . docusate sodium  100 mg Oral BID  . enoxaparin (LOVENOX) injection  40 mg Subcutaneous Q24H  . gabapentin  600 mg Oral BID  . insulin aspart  0-15 Units Subcutaneous Q4H  . insulin glargine  24 Units Subcutaneous QAC breakfast  . mupirocin ointment  1 application Nasal BID  . nicotine  21 mg Transdermal Daily  . nystatin cream   Topical BID  . QUEtiapine  600 mg Oral QHS  . rOPINIRole  2 mg Oral QHS  . sodium chloride flush  3 mL Intravenous Q12H   Continuous Infusions: . sodium chloride 10 mL/hr at 04/13/18 1245  . ampicillin (OMNIPEN) IV Stopped (04/16/18 1014)  . lactated ringers Stopped (04/13/18 1244)  . methocarbamol (ROBAXIN)  IV       LOS: 7 days      Phillips Climes, MD [facility] AQTMA:[phone] [email or web address] Password [email or web address] 04/16/2018, 1:05 PM

## 2018-04-16 NOTE — H&P (Signed)
Physical Medicine and Rehabilitation Admission H&P    Chief Complaint  Patient presents with  . R-BKA    HPI: [name] is a 52 year old failure female with history of COPD with ongoing tobacco use 3 packs a day,  obstructive sleep apnea,  poorly controlled diabetes mellitus with insensate neuropathy and retinopathy,  bilateral lower extremity edema,  Charcot foot;  who was admitted via ED on 04/09/2018 with weakness mental status changes and worsening of foot wound.  She was found to have strep agalactiae sepsis and was started on IV antibiotics for treatment.  MRI of the foot revealed early osteomyelitis with with abscess formation and she underwent I&D of right foot wound without improvement in healing.  Dr. 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  Therapy evaluations done and patient was noted to have functional deficits.  CIR was recommended for follow-up therapy.   Review of Systems  Constitutional: Negative for chills and fever.  HENT: Negative for hearing loss.   Eyes: Positive for blurred vision (has difficulty reading).  Respiratory: Negative for cough and shortness of breath.   Cardiovascular: Negative for chest pain and palpitations.  Gastrointestinal: Negative for heartburn and nausea.  Musculoskeletal: Negative for myalgias.  Skin: Negative for rash.  Neurological: Positive for sensory change.  Psychiatric/Behavioral: The patient has insomnia.     Past Medical History:  Diagnosis Date  . Anxiety   . Arthritis   . Bipolar disorder ([place])   . Broken jaw ([place])   . Broken wrist   . Chronic back pain   . Claudication ([place])    a. 12/2013 ABI's: R 0.97, L 0.94.  [name] COPD (chronic obstructive pulmonary disease) ([place])   . Depression   . Diabetic Charcot's foot ([place])   . Diabetic foot ulcer ([facility])    chronic left foot ulcer , great toe/  hx recurrent foot ulcer  bilaterally  . DJD (degenerative joint disease)   . Edema of both lower extremities   . Episode of memory loss   . GERD (gastroesophageal reflux disease)   . Headache(784.0)   . Hiatal hernia   . History of colon cancer, stage I dx 02-26-2015--- oncologist-- dr [name]--- per last note no recurrence   05-16-2015 s/p  Laparoscopic sigmoid colectomy w/ node bx's (negative nodes per path)  Stage I (pT1,N0,M0) Grade 2  . 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   per MRI multiple small strokes post cardiac cath. --  residual mild memory loss  . History of methicillin resistant staphylococcus aureus (MRSA)   . Hypercholesterolemia   . Insomnia   . Insulin dependent type 2 diabetes mellitus, uncontrolled ([place]) dx 2004   endocrinologist-  dr Dwyane Dee-- last A1c 8.8 in Aug2018:  pt is noncompliant w/ diet, states does note eat breakfast , her first main meal in afternoon  . Neurogenic bladder   . Neuropathy, diabetic ([place])    hands and feet  . OSA (obstructive sleep apnea)    cpap intolerant  . Personality disorder ([place])   . SOB (shortness of breath) on exertion   . Stroke ([place])   . SUI (stress urinary incontinence, female) S/P SLING 12-29-2011    Past Surgical History:  Procedure Laterality Date  . ABDOMINAL HYSTERECTOMY    . AMPUTATION Right 04/13/2018   Procedure: RIGHT BELOW KNEE AMPUTATION;  Surgeon: Newt Minion, MD;  Location: [facility];  Service: Orthopedics;  Laterality: Right;  . ANTERIOR CERVICAL DECOMP/DISCECTOMY FUSION  2000   C5 - 7  . CARDIAC CATHETERIZATION  05-22-2008   DR SKAINS   NO SIGNIFECANT CAD/ NORMAL LV/  EF 65%/  NO WALL MOTION ABNORMALITIES  . CARPAL TUNNEL RELEASE Right 04-25-2013  . [place]; 1992  . COLONOSCOPY    . CYSTO N/A 04/30/2013   Procedure: CYSTOSCOPY;  Surgeon: Reece Packer, MD;  [facility]: [facility];  Service: Urology;  Laterality: N/A;  . CYSTOSCOPY MACROPLASTIQUE IMPLANT N/A 02/06/2018   Procedure: CYSTOSCOPY  MACROPLASTIQUE IMPLANT;  Surgeon: Bjorn Loser, MD;  Location: [facility];  Service: Urology;  Laterality: N/A;  . CYSTOSCOPY WITH INJECTION  05/04/2012   Procedure: CYSTOSCOPY WITH INJECTION;  Surgeon: Reece Packer, MD;  Location: [facility];  Service: Urology;  Laterality: N/A;  MACROPLASTIQUE INJECTION  . CYSTOSCOPY WITH INJECTION  08/28/2012   Procedure: CYSTOSCOPY WITH INJECTION;  Surgeon: Reece Packer, MD;  Location: [facility];  Service: Urology;  Laterality: N/A;  cysto and macroplastique   . FOOT SURGERY Bilateral   . HERNIA REPAIR  ?1996   "stomach"  . INCISION AND DRAINAGE OF WOUND Right 04/11/2018   Procedure: IRRIGATION AND DEBRIDEMENT WOUND right foot and right ankle;  Surgeon: Evelina Bucy, DPM;  [facility];  Service: Podiatry;  Laterality: Right;  . KNEE ARTHROSCOPY W/ ALLOGRAFT IMPANT Left    graft x 2  . KNEE SURGERY     TOTAL 8 SURG'S  . LAPAROSCOPIC SIGMOID COLECTOMY N/A 04/22/2015   Procedure: LAPAROSCOPIC HAND ASSISTED SIGMOID COLECTOMY;  Surgeon: Erroll Luna, MD;  Location: [facility];  Service: General;  Laterality: N/A;  . LEFT HEART CATHETERIZATION WITH CORONARY ANGIOGRAM N/A 02/12/2014   Procedure: LEFT HEART CATHETERIZATION WITH CORONARY ANGIOGRAM;  Surgeon: Candee Furbish, MD;  Location[facility];  Service: Cardiovascular;  Laterality: N/A;   No angiographically significant CAD; normal LVSF, LVEDP 8mHg,  EF 55% (new finding ef 30% myoview 01-08-2014)  . LUMBAR FUSION    . MANDIBLE FRACTURE SURGERY    . MULTIPLE LAPAROSCOPIES FOR ENDOMETRIOSIS    . PUBOVAGINAL SLING  12/29/2011   Procedure: PGaynelle Arabian  Surgeon: SReece Packer MD;  [facility]  Service: Urology;  Laterality: N/A;  cysto and sparc sling   . PUBOVAGINAL SLING N/A 04/30/2013   Procedure: REMOVAL OF VAGINAL MESH;  Surgeon: SReece Packer MD;  [facility];  Service: Urology;  Laterality: N/A;   . RECONSTURCTION OF CONGENITAL UTERUS ANOMALY  1983  . REPEAT RECONSTRUCTION ACL LEFT KNEE/ SCREWS REMOVED  03-28-2000   CADAVER GRAFT  . TOTAL ABDOMINAL HYSTERECTOMY W/ BILATERAL SALPINGOOPHORECTOMY  1997  . TRANSTHORACIC ECHOCARDIOGRAM  02/13/2014   ef 45%, hypokinesis base inferior and base inferolateral walls  . UPPER GI ENDOSCOPY    . WOUND DEBRIDEMENT Right 09/05/2016   Procedure: DEBRIDEMENT WOUND WITH GRAFT RIGHT FOOT;  Surgeon: TLandis Martins DPM;  [place]  Service: Podiatry;  Laterality: Right;  . WRIST FRACTURE SURGERY      Family History  Problem Relation Age of Onset  . Hypertension Mother   . Diabetes Mother   . Cancer - Other Mother        lymphoma   . Cancer - Other Father        lung, bladder cancer   . Heart attack Father   . Cancer - Other Brother  bladder cancer     Social History:  Married. Independent with AD PTA. Husband has multiple health issues. She reports that she has been smoking cigarettes--3-4 PPD.  She has a 180.00 pack-year smoking history. She has never used smokeless tobacco. She reports that she does not drink alcohol or use drugs.    Allergies  Allergen Reactions  . Latex Itching, Rash and Other (See Comments)    Pt states she cannot use condoms - cause an infection.  Use of latex on skin is okay.  Tape causes rash  . Sweetening Enhancer [Flavoring Agent] Nausea And Vomiting and Other (See Comments)    HEADACHES  . Aspartame And Phenylalanine Nausea And Vomiting    HEADACHES  . Ibuprofen Other (See Comments)    HEADACHES  . Trazodone And Nefazodone Other (See Comments)    Hallucinations   . Triazolam Other (See Comments)    HALLUCINATIONS   Medications Prior to Admission  Medication Sig Dispense Refill  . Cariprazine HCl (VRAYLAR) 4.5 MG CAPS Take 4.5 mg by mouth at bedtime.    . cyclobenzaprine (FLEXERIL) 10 MG tablet Take 1 tablet (10 mg total) by mouth 3 (three) times daily as needed for muscle spasms. 90 tablet 2   . empagliflozin (JARDIANCE) 25 MG TABS tablet Take 25 mg by mouth daily. 90 tablet 0  . gabapentin (NEURONTIN) 600 MG tablet TAKE 1 TABLET (600 MG TOTAL) BY MOUTH 2 (TWO) TIMES DAILY. 60 tablet 5  . insulin aspart (NOVOLOG) 100 UNIT/ML FlexPen Take 10 units before your main meal. If blood sugars are consistently over 250 after the meal start taking 14 units before your main meal 35 UNITS AT SUPPER 45 mL 2  . Insulin Glargine (BASAGLAR KWIKPEN) 100 UNIT/ML SOPN Inject 0.36 mLs (36 Units total) into the skin daily before breakfast. And pen needles 1/day 5 pen 2  . lidocaine (LIDODERM) 5 % Place 1 patch onto the skin daily as needed (pain). Remove & Discard patch within 12 hours or as directed by MD 30 patch 2  . Melatonin 10 MG CAPS Take 10 mg by mouth at bedtime.     . nortriptyline (PAMELOR) 10 MG capsule Start nortriptyline 64m at bedtime for 2 week, then increase to 2 tablet at bedtime 60 capsule 5  . QUEtiapine (SEROQUEL) 300 MG tablet Take 600 mg by mouth at bedtime.    .Marland KitchenrOPINIRole (REQUIP) 2 MG tablet Take 2 mg by mouth daily.    . tamsulosin (FLOMAX) 0.4 MG CAPS capsule Take 0.4 mg by mouth every morning.     . Tasimelteon (HETLIOZ) 20 MG CAPS Take 20 mg by mouth at bedtime.    .Minus LibertyTosylate (INGREZZA) 80 MG CAPS Take 80 mg by mouth daily.    .Marland Kitchenzolpidem (AMBIEN) 5 MG tablet Take 5 mg by mouth at bedtime as needed for sleep.    .Marland Kitchenglucose blood (FREESTYLE LITE) test strip Use to check blood sugars up to 4 times daily. Dx Code E11.65 100 each 3  . INVOKANA 300 MG TABS tablet TAKE 1 TABLET BY MOUTH EVERY DAY BEFORE BREAKFAST (Patient not taking: Reported on 04/09/2018) 30 tablet 2  . SYRINGE-NEEDLE, DISP, 3 ML (LUER LOCK SAFETY SYRINGES) 25G X 1" 3 ML MISC 1 Package by Does not apply route daily. 50 each 0    Drug Regimen Review  Drug regimen was reviewed and remains appropriate with no significant issues identified  Home: Home Living Family/patient expects to be discharged to::  Private residence  Living Arrangements: Spouse/significant other Available Help at Discharge: Family Type of Home: House Home Access: Ramped entrance, Stairs to enter [employer] of Steps: 2 Entrance Stairs-Rails: None Home Layout: One level Bathroom Shower/Tub: Chiropodist: Standard Bathroom Accessibility: No Home Equipment: Environmental consultant - 4 wheels   Functional History: Prior Function Level of Independence: Independent Comments: per husband, pt was independent with mobility prior to admission  Functional Status:  Mobility: Bed Mobility Overal bed mobility: Needs Assistance Bed Mobility: Supine to Sit Supine to sit: Supervision Sit to supine: Supervision General bed mobility comments: Pt performed bed mobility with cues for safety to avoid pulling on lines/leads.   Transfers Overall transfer level: Needs assistance Equipment used: Rolling walker (2 wheeled) Transfers: Sit to/from Stand Sit to Stand: Supervision Stand pivot transfers: Min assist General transfer comment: Cues for safe hand placement, patient tends to pull up on RW vs. pushing from seated surface.   Ambulation/Gait Ambulation/Gait assistance: Min guard, Min assist Ambulation Distance (Feet): 20 Feet(+ 10 ft.  ) Assistive device: Rolling walker (2 wheeled) Gait Pattern/deviations: Step-to pattern(hop to pattern with flexed posture.  ) General Gait Details: cues for posture and safe proximity to RW; adjusted RW height to improve leverage.  Pt fatigues quickly.   Gait velocity: decreased    ADL: ADL Overall ADL's : Needs assistance/impaired Eating/Feeding: Modified independent Grooming: Set up, Wash/dry hands, Wash/dry face, Sitting Grooming Details (indicate cue type and reason): in recliner Upper Body Bathing: Set up, Sitting Lower Body Bathing: Minimal assistance Upper Body Dressing : Set up, Sitting Lower Body Dressing: Maximal assistance, Sitting/lateral leans Lower Body  Dressing Details (indicate cue type and reason): able to do chair push up to don mesh underwear in recliner with lateral leans; max A to don residual limb guard Toilet Transfer: Minimal assistance, Stand-pivot, BSC, RW Toilet Transfer Details (indicate cue type and reason): pushes up on RW with stability from OT Toileting- Clothing Manipulation and Hygiene: Set up, Sitting/lateral lean Functional mobility during ADLs: Minimal assistance, Rolling walker, +2 for safety/equipment(SPT only) General ADL Comments: cognition impacting ability to perform ADL   Cognition: Cognition Overall Cognitive Status: Impaired/Different from baseline Orientation Level: Oriented X4 Cognition Arousal/Alertness: Suspect due to medications Behavior During Therapy: WFL for tasks assessed/performed Overall Cognitive Status: Impaired/Different from baseline Area of Impairment: Orientation, Attention, Memory, Safety/judgement, Problem solving Orientation Level: Disoriented to, Time Current Attention Level: Sustained Memory: Decreased short-term memory, Decreased recall of precautions Safety/Judgement: Decreased awareness of safety, Decreased awareness of deficits Problem Solving: Difficulty sequencing, Requires verbal cues General Comments: Pt drowsy throughout session, will awaken to her name or activity but shortly falls back asleep. Does not recall who worked with her yesterday or how to don residual limb protector.   Blood pressure 129/87, pulse 74, temperature 98.3 F (36.8 C), temperature source Oral, resp. rate 18, height '5\' 5"'  (1.651 m), weight 96 kg (211 lb 10.3 oz), SpO2 98 %. Physical Exam  Nursing note and vitals reviewed. Constitutional: She is oriented to person, place, and time. She appears well-developed and well-nourished.  HENT:  Head: Normocephalic and atraumatic.  Eyes: Pupils are equal, round, and reactive to light. Conjunctivae are normal.  Neck: Normal range of motion. Neck supple.   Cardiovascular: Normal rate and regular rhythm.  Respiratory: Effort normal and breath sounds normal. No respiratory distress.  GI: Soft. Bowel sounds are normal. She exhibits no distension. There is no tenderness.  Musculoskeletal:  R-BKA covered with limb guard over  [place] VAC.   Neurological: She  is alert and oriented to person, place, and time.  Skin: Skin is warm and dry.  Psychiatric: She has a normal mood and affect. Her behavior is normal. Thought content normal.    Results for orders placed or performed during the hospital encounter of 04/09/18 (from the past 48 hour(s))  Glucose, capillary     Status: Abnormal   Collection Time: 04/14/18  4:59 PM  Result Value Ref Range   Glucose-Capillary 189 (H) 65 - 99 mg/dL   Comment 1 Notify RN    Comment 2 Document in Chart   Glucose, capillary     Status: Abnormal   Collection Time: 04/14/18  8:08 PM  Result Value Ref Range   Glucose-Capillary 180 (H) 65 - 99 mg/dL  Glucose, capillary     Status: Abnormal   Collection Time: 04/15/18  6:04 AM  Result Value Ref Range   Glucose-Capillary 149 (H) 65 - 99 mg/dL  Glucose, capillary     Status: Abnormal   Collection Time: 04/15/18  1:28 PM  Result Value Ref Range   Glucose-Capillary 139 (H) 65 - 99 mg/dL  Glucose, capillary     Status: Abnormal   Collection Time: 04/15/18  4:25 PM  Result Value Ref Range   Glucose-Capillary 283 (H) 65 - 99 mg/dL   Comment 1 Notify RN    Comment 2 Document in Chart   Glucose, capillary     Status: Abnormal   Collection Time: 04/15/18  8:22 PM  Result Value Ref Range   Glucose-Capillary 202 (H) 65 - 99 mg/dL  Glucose, capillary     Status: Abnormal   Collection Time: 04/16/18 12:32 AM  Result Value Ref Range   Glucose-Capillary 214 (H) 65 - 99 mg/dL  Glucose, capillary     Status: None   Collection Time: 04/16/18  4:38 AM  Result Value Ref Range   Glucose-Capillary 83 65 - 99 mg/dL  Creatinine, serum     Status: None   Collection Time:  04/16/18  5:14 AM  Result Value Ref Range   Creatinine, Ser 0.51 0.44 - 1.00 mg/dL   GFR calc non Af Amer >60 >60 mL/min   GFR calc Af Amer >60 >60 mL/min    Comment: (NOTE) The eGFR has been calculated using the CKD EPI equation. This calculation has not been validated in all clinical situations. eGFR's persistently <60 mL/min signify possible Chronic Kidney Disease. Performed at [facility], [place] [address] [place], [place] [address]   Glucose, capillary     Status: Abnormal   Collection Time: 04/16/18  8:51 AM  Result Value Ref Range   Glucose-Capillary 169 (H) 65 - 99 mg/dL  Glucose, capillary     Status: Abnormal   Collection Time: 04/16/18 11:42 AM  Result Value Ref Range   Glucose-Capillary 178 (H) 65 - 99 mg/dL   No results found.     Medical Problem List and Plan: 1.  Functional deficits secondary to right BKA  -admit to inpatient rehab 2.  DVT Prophylaxis/Anticoagulation: Pharmaceutical: Lovenox 3. Pain Management: oxycodone prn. Educate patient on desensitization. Titrate gabapentin upwards as tolerated.   -limb guard for stump protection 4. Mood: LCSW to follow for evaluation and support.  5. Neuropsych: This patient is capable of making decisions on her own behalf. 6. Skin/Wound Care: Monitor wound for healing. Routine pressure relief measures.  7. Fluids/Electrolytes/Nutrition: Monitor I/O. Check lytes in am.  8.T2DM: Monitor BS ac/hs. Continue Lantus (basaglar 36 units) at bedtime with SSI. Resume  meal coverage at 5 units tid ac.  9. Step agalactiae sepsis: TH recommends changing IV ampicillin to amoxicillin for 14 day total antibiotic regimen.   10. Bipolar disorder/Personality disorder: On Seroquel at bedtime.   Post Admission Physician Evaluation: 1. Functional deficits secondary  to right BKA. 2. Patient is admitted to receive collaborative, interdisciplinary care between the physiatrist, rehab nursing staff, and therapy team. 3. Patient's level  of medical complexity and substantial therapy needs in context of that medical necessity cannot be provided at a lesser intensity of care such as a SNF. 4. Patient has experienced substantial functional loss from his/her baseline which was documented above under the "Functional History" and "Functional Status" headings.  Judging by the patient's diagnosis, physical exam, and functional history, the patient has potential for functional progress which will result in measurable gains while on inpatient rehab.  These gains will be of substantial and practical use upon discharge  in facilitating mobility and self-care at the household level. 5. Physiatrist will provide 24 hour management of medical needs as well as oversight of the therapy plan/treatment and provide guidance as appropriate regarding the interaction of the two. 6. The Preadmission Screening has been reviewed and patient status is unchanged unless otherwise stated above. 7. 24 hour rehab nursing will assist with bladder management, bowel management, safety, skin/wound care, disease management, medication administration, pain management and patient education  and help integrate therapy concepts, techniques,education, etc. 8. PT will assess and treat for/with: Lower extremity strength, range of motion, stamina, balance, functional mobility, safety, adaptive techniques and equipment, pre-prosthetic education, pain control, wound care.   Goals are: mod I. 9. OT will assess and treat for/with: ADL's, functional mobility, safety, upper extremity strength, adaptive techniques and equipment, pain mgt, pre-prosthetic education.   Goals are: mod I. Therapy may  proceed with showering this patient if limb is covered.. 10. SLP will assess and treat for/with: n/a.  Goals are: n/a. 11. Case Management and Social Worker will assess and treat for psychological issues and discharge planning. 12. Team conference will be held weekly to assess progress toward goals  and to determine barriers to discharge. 13. Patient will receive at least 3 hours of therapy per day at least 5 days per week. 14. ELOS: 7-10 days       15. Prognosis:  excellent    I have personally performed a face to face diagnostic evaluation of this patient. Additionally, I have reviewed and concur with the physician assistant's documentation above.  Meredith Staggers, MD, Mellody Drown  04/16/2018

## 2018-04-16 NOTE — Progress Notes (Signed)
PMR Admission Coordinator Pre-Admission Assessment  Patient: [name] is an 52 y.o., female MRN: [id number] DOB: 03/14/1966 Height: 5\' 5"  (165.1 cm) Weight: 96 kg (211 lb 10.3 oz)                                                                                                                                                  Insurance Information HMO:     PPO:      PCP:      IPA:      80/20:      OTHER:  PRIMARY: Medicare A & B      Policy#: [id number]      Subscriber: Self CM Name:       Phone#:      Fax#:  Pre-Cert#: Eligible       Employer: Disabled  Benefits:  Phone #: Verified online      Name: [employer]  Eff. Date: 10/27/95     Deduct: $1364      Out of Pocket Max: N/A      Life Max: N/A CIR: 100%       SNF: 100% days 1-20, 80% days 21-100 Outpatient: 80%     Co-Pay: 20% Home Health: 100%      Co-Pay: $0 DME: 80%     Co-Pay: 20% Providers: In-network   SECONDARY: Medicaid of [place]      Policy#: [id number]      Subscriber: Self CM Name:       Phone#:      Fax#:  Pre-Cert#: Coverage code:[id number]      Employer: Disabled  Benefits:  Phone #: [phone]     Name: Verified via automated system   Eff. Date: Eligible 04/16/48     Deduct:       Out of Pocket Max:       Life Max:  CIR:       SNF:  Outpatient:      Co-Pay:  Home Health:       Co-Pay:  DME:      Co-Pay:   Medicaid Application Date:       Case Manager:  Disability Application Date:       Case Worker:   Emergency Contact Information         Contact Information    Name Relation Home Work Mobile   Smith,David Significant other   [phone]   [name]   [phone]   [name] [phone]       Current Medical History  Patient Admitting Diagnosis: right BKA  History of Present Illness: [name] a 52 y.o.femalewith history of COPD, OSA, poorly controlled DMwith insensate neuropathy and retinopathy, ongoing tobacco use-3-4 PPD, BLE edema, charcot foot who  was admitted via ED on 04/09/18 with weakness, mental status changes and worsening of foot wound. She was found  to have step agalactiae sepsis and was started on IV antibiotics for treatment. MRI foot revealed early osteomyelitis with abscess and she underwent I &D Right foot wound without improvement in healing. Dr. Sharol Given was consulted for input and recommended BKA. She underwent BKA on 04/19 with recommendations for tobacco cessation and better diabetes control. Therapy evaluation done this weekend and CIR recommended due to functional deficits.  Patient admitted to IP Rehab 04/16/18.     Past Medical History      Past Medical History:  Diagnosis Date  . Anxiety   . Arthritis   . Bipolar disorder ([place])   . Broken jaw ([place])   . Broken wrist   . Chronic back pain   . Claudication ([place])    a. 12/2013 ABI's: R 0.97, L 0.94.  [name] COPD (chronic obstructive pulmonary disease) ([place])   . Depression   . Diabetic Charcot's foot ([place])   . Diabetic foot ulcer ([facility])    chronic left foot ulcer , great toe/  hx recurrent foot ulcer bilaterally  . DJD (degenerative joint disease)   . Edema of both lower extremities   . Episode of memory loss   . GERD (gastroesophageal reflux disease)   . Headache(784.0)   . Hiatal hernia   . History of colon cancer, stage I dx 02-26-2015--- oncologist-- dr [name]--- per last note no recurrence   05-16-2015 s/p  Laparoscopic sigmoid colectomy w/ node bx's (negative nodes per path)  Stage I (pT1,N0,M0) Grade 2  . 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   per MRI multiple small strokes post cardiac cath. --  residual mild memory loss  . History of methicillin resistant staphylococcus aureus (MRSA)   . Hypercholesterolemia   . Insomnia   . Insulin dependent type 2 diabetes mellitus, uncontrolled ([place]) dx 2004   endocrinologist-  dr Dwyane Dee-- last A1c 8.8 in Aug2018:  pt is noncompliant w/ diet, states does note  eat breakfast , her first main meal in afternoon  . Neurogenic bladder   . Neuropathy, diabetic ([place])    hands and feet  . OSA (obstructive sleep apnea)    cpap intolerant  . Personality disorder ([place])   . SOB (shortness of breath) on exertion   . Stroke ([place])   . SUI (stress urinary incontinence, female) S/P SLING 12-29-2011    Family History  family history includes Cancer - Other in her brother, father, and mother; Diabetes in her mother; Heart attack in her father; Hypertension in her mother.  Prior Rehab/Hospitalizations:  Has the patient had major surgery during 100 days prior to admission? Yes  Current Medications   Current Facility-Administered Medications:  .  0.9 %  sodium chloride infusion, , Intravenous, Continuous, Newt Minion, MD, Last Rate: 10 mL/hr at 04/13/18 1245 .  acetaminophen (TYLENOL) tablet 325-650 mg, 325-650 mg, Oral, Q6H PRN, Newt Minion, MD, 650 mg at 04/15/18 2247 .  albuterol (PROVENTIL) (2.5 MG/3ML) 0.083% nebulizer solution 2.5 mg, 2.5 mg, Nebulization, Q6H PRN, Newt Minion, MD, 2.5 mg at 04/11/18 1558 .  ampicillin (OMNIPEN) 2 g in sodium chloride 0.9 % 100 mL IVPB, 2 g, Intravenous, Q4H, Newt Minion, MD, Stopped at 04/16/18 1538 .  bisacodyl (DULCOLAX) EC tablet 5 mg, 5 mg, Oral, Daily PRN, Newt Minion, MD .  bisacodyl (DULCOLAX) suppository 10 mg, 10 mg, Rectal, Daily PRN, Newt Minion, MD .  Chlorhexidine Gluconate Cloth 2 % PADS 6 each, 6 each, Topical,  [id number], Newt Minion, MD, 6 each at 04/16/18 0532 .  docusate sodium (COLACE) capsule 100 mg, 100 mg, Oral, BID, Newt Minion, MD, 100 mg at 04/16/18 0942 .  enoxaparin (LOVENOX) injection 40 mg, 40 mg, Subcutaneous, Q24H, Newt Minion, MD, 40 mg at 04/16/18 0944 .  gabapentin (NEURONTIN) capsule 600 mg, 600 mg, Oral, BID, Newt Minion, MD, 600 mg at 04/16/18 0942 .  guaiFENesin-dextromethorphan (ROBITUSSIN DM) 100-10 MG/5ML syrup 5 mL, 5 mL, Oral, Q4H PRN, Newt Minion, MD, 5 mL at 04/14/18 1709 .  insulin aspart (novoLOG) injection 0-15 Units, 0-15 Units, Subcutaneous, Q4H, Newt Minion, MD, 3 Units at 04/16/18 1251 .  insulin glargine (LANTUS) injection 24 Units, 24 Units, Subcutaneous, QAC breakfast, Elgergawy, Silver Huguenin, MD, 24 Units at 04/16/18 0940 .  ipratropium (ATROVENT) nebulizer solution 0.5 mg, 0.5 mg, Nebulization, Q6H PRN, Newt Minion, MD .  lactated ringers infusion, , Intravenous, Continuous, Newt Minion, MD, Stopped at 04/13/18 1244 .  lidocaine (LIDODERM) 5 % 1 patch, 1 patch, Transdermal, Daily PRN, Newt Minion, MD .  lip balm (CARMEX) ointment, , Topical, PRN, Newt Minion, MD .  magnesium citrate solution 1 Bottle, 1 Bottle, Oral, Once PRN, Newt Minion, MD .  methocarbamol (ROBAXIN) tablet 500 mg, 500 mg, Oral, Q6H PRN, 500 mg at 04/16/18 0949 **OR** methocarbamol (ROBAXIN) 500 mg in dextrose 5 % 50 mL IVPB, 500 mg, Intravenous, Q6H PRN, Newt Minion, MD .  metoCLOPramide (REGLAN) tablet 5-10 mg, 5-10 mg, Oral, Q8H PRN **OR** metoCLOPramide (REGLAN) injection 5-10 mg, 5-10 mg, Intravenous, Q8H PRN, Newt Minion, MD .  mupirocin ointment (BACTROBAN) 2 % 1 application, 1 application, Nasal, BID, Newt Minion, MD, 1 application at 06/20/93 [phone] .  nicotine (NICODERM CQ - dosed in mg/24 hours) patch 21 mg, 21 mg, Transdermal, Daily, Newt Minion, MD, 21 mg at 04/16/18 0943 .  nystatin cream (MYCOSTATIN), , Topical, BID, Rizwan, Saima, MD .  ondansetron (ZOFRAN) tablet 4 mg, 4 mg, Oral, Q6H PRN **OR** ondansetron (ZOFRAN) injection 4 mg, 4 mg, Intravenous, Q6H PRN, Newt Minion, MD .  oxyCODONE (Oxy IR/ROXICODONE) immediate release tablet 10-15 mg, 10-15 mg, Oral, Q4H PRN, Newt Minion, MD, 15 mg at 04/16/18 0949 .  oxyCODONE (Oxy IR/ROXICODONE) immediate release tablet 5-10 mg, 5-10 mg, Oral, Q4H PRN, Newt Minion, MD, 10 mg at 04/14/18 1316 .  oxyCODONE-acetaminophen (PERCOCET/ROXICET) 5-325 MG per tablet 1-2  tablet, 1-2 tablet, Oral, Q6H PRN, Newt Minion, MD, 2 tablet at 04/16/18 0156 .  polyethylene glycol (MIRALAX / GLYCOLAX) packet 17 g, 17 g, Oral, Daily PRN, Newt Minion, MD .  QUEtiapine (SEROQUEL) tablet 600 mg, 600 mg, Oral, QHS, Newt Minion, MD, 600 mg at 04/15/18 2248 .  rOPINIRole (REQUIP) tablet 2 mg, 2 mg, Oral, QHS, Newt Minion, MD, 2 mg at 04/15/18 2249 .  senna-docusate (Senokot-S) tablet 1 tablet, 1 tablet, Oral, QHS PRN, Newt Minion, MD .  sodium chloride flush (NS) 0.9 % injection 3 mL, 3 mL, Intravenous, Q12H, Newt Minion, MD, 3 mL at 04/16/18 0944  Patients Current Diet: Fall precautions Diet Carb Modified Fluid consistency: Thin; Room service appropriate? Yes  Precautions / Restrictions Precautions Precautions: Fall Restrictions Weight Bearing Restrictions: Yes RLE Weight Bearing: Non weight bearing LLE Weight Bearing: Weight bearing as tolerated Other Position/Activity Restrictions: unsure of WB status at this time. Await further recs from MD   Has  the patient had 2 or more falls or a fall with injury in the past year?Yes  Prior Activity Level Limited Community (1-2x/wk): Prior to admission patient got out when her husband could drive her.  He also helped manage her medications and medical visits.   Home Assistive Devices / Equipment Home Assistive Devices/Equipment: None Home Equipment: Walker - 4 wheels  Prior Device Use: Indicate devices/aids used by the patient prior to current illness, exacerbation or injury? Special shoes   Prior Functional Level Prior Function Level of Independence: Independent Comments: per husband, pt was independent with mobility prior to admission  Self Care: Did the patient need help bathing, dressing, using the toilet or eating? Independent  Indoor Mobility: Did the patient need assistance with walking from room to room (with or without device)? Independent  Stairs: Did the patient need assistance with  internal or external stairs (with or without device)? Independent  Functional Cognition: Did the patient need help planning regular tasks such as shopping or remembering to take medications? Needed some help  Current Functional Level Cognition  Overall Cognitive Status: Impaired/Different from baseline Current Attention Level: Sustained Orientation Level: Oriented X4 Safety/Judgement: Decreased awareness of safety, Decreased awareness of deficits General Comments: Pt drowsy throughout session, will awaken to her name or activity but shortly falls back asleep. Does not recall who worked with her yesterday or how to don residual limb protector.    Extremity Assessment (includes Sensation/Coordination)  Upper Extremity Assessment: Overall WFL for tasks assessed(strong! )  Lower Extremity Assessment: RLE deficits/detail, LLE deficits/detail RLE Deficits / Details: new BKA - residual limb protector used during session RLE Sensation: history of peripheral neuropathy RLE Coordination: decreased gross motor LLE Deficits / Details: dressing on foot; history of multiple knee sx LLE Sensation: history of peripheral neuropathy    ADLs  Overall ADL's : Needs assistance/impaired Eating/Feeding: Modified independent Grooming: Set up, Wash/dry hands, Wash/dry face, Sitting Grooming Details (indicate cue type and reason): in recliner Upper Body Bathing: Set up, Sitting Lower Body Bathing: Minimal assistance Upper Body Dressing : Set up, Sitting Lower Body Dressing: Maximal assistance, Sitting/lateral leans Lower Body Dressing Details (indicate cue type and reason): able to do chair push up to don mesh underwear in recliner with lateral leans; max A to don residual limb guard Toilet Transfer: Minimal assistance, Stand-pivot, BSC, RW Toilet Transfer Details (indicate cue type and reason): pushes up on RW with stability from OT Toileting- Clothing Manipulation and Hygiene: Set up, Sitting/lateral  lean Functional mobility during ADLs: Minimal assistance, Rolling walker, +2 for safety/equipment(SPT only) General ADL Comments: cognition impacting ability to perform ADL     Mobility  Overal bed mobility: Needs Assistance Bed Mobility: Supine to Sit Supine to sit: Supervision Sit to supine: Supervision General bed mobility comments: Pt performed bed mobility with cues for safety to avoid pulling on lines/leads.      Transfers  Overall transfer level: Needs assistance Equipment used: Rolling walker (2 wheeled) Transfers: Sit to/from Stand Sit to Stand: Supervision Stand pivot transfers: Min assist General transfer comment: Cues for safe hand placement, patient tends to pull up on RW vs. pushing from seated surface.      Ambulation / Gait / Stairs / Wheelchair Mobility  Ambulation/Gait Ambulation/Gait assistance: Min guard, Min assist Ambulation Distance (Feet): 20 Feet(+ 10 ft.  ) Assistive device: Rolling walker (2 wheeled) Gait Pattern/deviations: Step-to pattern(hop to pattern with flexed posture.  ) General Gait Details: cues for posture and safe proximity to RW; adjusted RW height  to improve leverage.  Pt fatigues quickly.   Gait velocity: decreased    Posture / Balance Balance Overall balance assessment: Needs assistance Sitting-balance support: No upper extremity supported, Feet supported Sitting balance-Leahy Scale: Good Standing balance support: Bilateral upper extremity supported Standing balance-Leahy Scale: Fair Standing balance comment: Reliant on RW for balance     Special needs/care consideration BiPAP/CPAP: No CPM: No Continuous Drip IV: No Dialysis: No        Life Vest: No Oxygen: None PTA Special Bed: No Trach Size: No Wound Vac (area): Yes      Location: Rt. BKA Skin: MASD to vagina; Cellulitis left foot                                Bowel mgmt:  Bladder mgmt: Continent, last BM 04/15/18 Diabetic mgmt: Yes, with CBG checks, oral, and  insulin per report      Previous Home Environment Living Arrangements: Spouse/significant other Available Help at Discharge: Family Type of Home: House Home Layout: One level Home Access: Ramped entrance, Stairs to enter Entrance Stairs-Rails: None Entrance Stairs-Number of Steps: 2 Bathroom Shower/Tub: Optometrist: No Home Care Services: Yes Type of Home Care Services: [place] (if known): incompass  Discharge Living Setting Plans for Discharge Living Setting: Patient's home, Lives with (comment)(Spouse) Type of Home at Discharge: House Discharge Home Layout: One level, Other (Comment)(with 1 step down into bedroom  ) Discharge Home Access: Ramped entrance Discharge Bathroom Shower/Tub: Other (comment)(bathed at the sink due to deep jacuzzi tub) Discharge Bathroom Toilet: Standard Discharge Bathroom Accessibility: Yes How Accessible: Accessible via walker Does the patient have any problems obtaining your medications?: No  Social/Family/Support Systems Patient Roles: Spouse Contact Information: She refers to him as "Spouse" but significant other: Etta Quill  Anticipated Caregiver: Shanon Brow  Anticipated Caregiver's Contact Information: see above  Ability/Limitations of Caregiver: None Caregiver Availability: 24/7 Discharge Plan Discussed with Primary Caregiver: Yes(Discussed with patient, Mod I goals ) Is Caregiver In Agreement with Plan?: Yes(Patient is, Mod I goals ) Does Caregiver/Family have Issues with Lodging/Transportation while Pt is in Rehab?: No  Goals/Additional Needs Patient/Family Goal for Rehab: PT/OT: Mod I Expected length of stay: 7-10 days  Cultural Considerations: She doesn't take off her wedding band per her report  Dietary Needs: Carb. Mod. diet restrictions, but patient reports allergy to artifical sweetners. Medical team please clarify  Equipment Needs: TBD Pt/Family Agrees to  Admission and willing to participate: Yes Program Orientation Provided & Reviewed with Pt/Caregiver Including Roles  & Responsibilities: Yes Additional Information Needs: See Dr.Harkins for pain management PTA Information Needs to be Provided By: Team FYI  Decrease burden of Care through IP rehab admission: No   Possible need for SNF placement upon discharge: No   Patient Condition: This patient's condition remains as documented in the consult dated 04/16/18, in which the Rehabilitation Physician determined and documented that the patient's condition is appropriate for intensive rehabilitative care in an inpatient rehabilitation facility. Will admit to inpatient rehab today.  Preadmission Screen Completed By:  Gunnar Fusi, 04/16/2018 4:54 PM ______________________________________________________________________   Discussed status with Dr. Naaman Plummer on 04/16/18 at 64 and received telephone approval for admission today.  Admission Coordinator:  Gunnar Fusi, time 1700/Date 04/16/18             Cosigned by: Meredith Staggers, MD at 04/16/2018 5:46 PM  Revision History

## 2018-04-16 NOTE — H&P (Signed)
Physical Medicine and Rehabilitation Admission H&P       Chief Complaint  Patient presents with  . R-BKA    HPI: [name] is a 52 year old failure female with history of COPD with ongoing tobacco use 3 packs a day,  obstructive sleep apnea,  poorly controlled diabetes mellitus with insensate neuropathy and retinopathy,  bilateral lower extremity edema,  Charcot foot;  who was admitted via ED on 04/09/2018 with weakness mental status changes and worsening of foot wound.  She was found to have strep agalactiae sepsis and was started on IV antibiotics for treatment.  MRI of the foot revealed early osteomyelitis with with abscess formation and she underwent I&D of right foot wound without improvement in healing.  Dr. 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  Therapy evaluations done and patient was noted to have functional deficits.  CIR was recommended for follow-up therapy.   Review of Systems  Constitutional: Negative for chills and fever.  HENT: Negative for hearing loss.   Eyes: Positive for blurred vision (has difficulty reading).  Respiratory: Negative for cough and shortness of breath.   Cardiovascular: Negative for chest pain and palpitations.  Gastrointestinal: Negative for heartburn and nausea.  Musculoskeletal: Negative for myalgias.  Skin: Negative for rash.  Neurological: Positive for sensory change.  Psychiatric/Behavioral: The patient has insomnia.         Past Medical History:  Diagnosis Date  . Anxiety   . Arthritis   . Bipolar disorder ([place])   . Broken jaw ([place])   . Broken wrist   . Chronic back pain   . Claudication ([place])    a. 12/2013 ABI's: R 0.97, L 0.94.  [name] COPD (chronic obstructive pulmonary disease) ([place])   . Depression   . Diabetic Charcot's foot ([place])   . Diabetic foot ulcer ([facility])    chronic left foot ulcer , great toe/  hx  recurrent foot ulcer bilaterally  . DJD (degenerative joint disease)   . Edema of both lower extremities   . Episode of memory loss   . GERD (gastroesophageal reflux disease)   . Headache(784.0)   . Hiatal hernia   . History of colon cancer, stage I dx 02-26-2015--- oncologist-- dr [name]--- per last note no recurrence   05-16-2015 s/p  Laparoscopic sigmoid colectomy w/ node bx's (negative nodes per path)  Stage I (pT1,N0,M0) Grade 2  . 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   per MRI multiple small strokes post cardiac cath. --  residual mild memory loss  . History of methicillin resistant staphylococcus aureus (MRSA)   . Hypercholesterolemia   . Insomnia   . Insulin dependent type 2 diabetes mellitus, uncontrolled ([place]) dx 2004   endocrinologist-  dr Dwyane Dee-- last A1c 8.8 in Aug2018:  pt is noncompliant w/ diet, states does note eat breakfast , her first main meal in afternoon  . Neurogenic bladder   . Neuropathy, diabetic ([place])    hands and feet  . OSA (obstructive sleep apnea)    cpap intolerant  . Personality disorder ([place])   . SOB (shortness of breath) on exertion   . Stroke ([place])   . SUI (stress urinary incontinence, female) S/P SLING 12-29-2011         Past Surgical History:  Procedure Laterality Date  . ABDOMINAL HYSTERECTOMY    . AMPUTATION Right 04/13/2018   Procedure: RIGHT  BELOW KNEE AMPUTATION;  Surgeon: Newt Minion, MD;  Location: [facility];  Service: Orthopedics;  Laterality: Right;  . ANTERIOR CERVICAL DECOMP/DISCECTOMY FUSION  2000   C5 - 7  . CARDIAC CATHETERIZATION  05-22-2008   DR SKAINS   NO SIGNIFECANT CAD/ NORMAL LV/  EF 65%/  NO WALL MOTION ABNORMALITIES  . CARPAL TUNNEL RELEASE Right 04-25-2013  . [place]; 1992  . COLONOSCOPY    . CYSTO N/A 04/30/2013   Procedure: CYSTOSCOPY;  Surgeon: Reece Packer, MD;  [facility]: [facility];  Service: Urology;  Laterality: N/A;  . CYSTOSCOPY  MACROPLASTIQUE IMPLANT N/A 02/06/2018   Procedure: CYSTOSCOPY MACROPLASTIQUE IMPLANT;  Surgeon: Bjorn Loser, MD;  Location: [facility];  Service: Urology;  Laterality: N/A;  . CYSTOSCOPY WITH INJECTION  05/04/2012   Procedure: CYSTOSCOPY WITH INJECTION;  Surgeon: Reece Packer, MD;  Location: [facility];  Service: Urology;  Laterality: N/A;  MACROPLASTIQUE INJECTION  . CYSTOSCOPY WITH INJECTION  08/28/2012   Procedure: CYSTOSCOPY WITH INJECTION;  Surgeon: Reece Packer, MD;  Location: [facility];  Service: Urology;  Laterality: N/A;  cysto and macroplastique   . FOOT SURGERY Bilateral   . HERNIA REPAIR  ?1996   "stomach"  . INCISION AND DRAINAGE OF WOUND Right 04/11/2018   Procedure: IRRIGATION AND DEBRIDEMENT WOUND right foot and right ankle;  Surgeon: Evelina Bucy, DPM;  Location[facility];  Service: Podiatry;  Laterality: Right;  . KNEE ARTHROSCOPY W/ ALLOGRAFT IMPANT Left    graft x 2  . KNEE SURGERY     TOTAL 8 SURG'S  . LAPAROSCOPIC SIGMOID COLECTOMY N/A 04/22/2015   Procedure: LAPAROSCOPIC HAND ASSISTED SIGMOID COLECTOMY;  Surgeon: Erroll Luna, MD;  Location: [place];  Service: General;  Laterality: N/A;  . LEFT HEART CATHETERIZATION WITH CORONARY ANGIOGRAM N/A 02/12/2014   Procedure: LEFT HEART CATHETERIZATION WITH CORONARY ANGIOGRAM;  Surgeon: Candee Furbish, MD;  Location: [facility];  Service: Cardiovascular;  Laterality: N/A;   No angiographically significant CAD; normal LVSF, LVEDP 36mHg,  EF 55% (new finding ef 30% myoview 01-08-2014)  . LUMBAR FUSION    . MANDIBLE FRACTURE SURGERY    . MULTIPLE LAPAROSCOPIES FOR ENDOMETRIOSIS    . PUBOVAGINAL SLING  12/29/2011   Procedure: PGaynelle Arabian  Surgeon: SReece Packer MD;  [facility]  Service: Urology;  Laterality: N/A;  cysto and sparc sling   . PUBOVAGINAL SLING N/A 04/30/2013   Procedure: REMOVAL OF VAGINAL MESH;   Surgeon: SReece Packer MD;  [facility];  Service: Urology;  Laterality: N/A;  . RECONSTURCTION OF CONGENITAL UTERUS ANOMALY  1983  . REPEAT RECONSTRUCTION ACL LEFT KNEE/ SCREWS REMOVED  03-28-2000   CADAVER GRAFT  . TOTAL ABDOMINAL HYSTERECTOMY W/ BILATERAL SALPINGOOPHORECTOMY  1997  . TRANSTHORACIC ECHOCARDIOGRAM  02/13/2014   ef 45%, hypokinesis base inferior and base inferolateral walls  . UPPER GI ENDOSCOPY    . WOUND DEBRIDEMENT Right 09/05/2016   Procedure: DEBRIDEMENT WOUND WITH GRAFT RIGHT FOOT;  Surgeon: TLandis Martins DPM;  [place]  Service: Podiatry;  Laterality: Right;  . WRIST FRACTURE SURGERY           Family History  Problem Relation Age of Onset  . Hypertension Mother   . Diabetes Mother   . Cancer - Other Mother        lymphoma   . Cancer - Other Father        lung, bladder  cancer   . Heart attack Father   . Cancer - Other Brother        bladder cancer     Social History:  Married. Independent with AD PTA. Husband has multiple health issues. She reports that she has been smoking cigarettes--3-4 PPD.  She has a 180.00 pack-year smoking history. She has never used smokeless tobacco. She reports that she does not drink alcohol or use drugs.         Allergies  Allergen Reactions  . Latex Itching, Rash and Other (See Comments)    Pt states she cannot use condoms - cause an infection.  Use of latex on skin is okay.  Tape causes rash  . Sweetening Enhancer [Flavoring Agent] Nausea And Vomiting and Other (See Comments)    HEADACHES  . Aspartame And Phenylalanine Nausea And Vomiting    HEADACHES  . Ibuprofen Other (See Comments)    HEADACHES  . Trazodone And Nefazodone Other (See Comments)    Hallucinations   . Triazolam Other (See Comments)    HALLUCINATIONS         Medications Prior to Admission  Medication Sig Dispense Refill  . Cariprazine HCl (VRAYLAR) 4.5 MG CAPS Take 4.5 mg by mouth at  bedtime.    . cyclobenzaprine (FLEXERIL) 10 MG tablet Take 1 tablet (10 mg total) by mouth 3 (three) times daily as needed for muscle spasms. 90 tablet 2  . empagliflozin (JARDIANCE) 25 MG TABS tablet Take 25 mg by mouth daily. 90 tablet 0  . gabapentin (NEURONTIN) 600 MG tablet TAKE 1 TABLET (600 MG TOTAL) BY MOUTH 2 (TWO) TIMES DAILY. 60 tablet 5  . insulin aspart (NOVOLOG) 100 UNIT/ML FlexPen Take 10 units before your main meal. If blood sugars are consistently over 250 after the meal start taking 14 units before your main meal 35 UNITS AT SUPPER 45 mL 2  . Insulin Glargine (BASAGLAR KWIKPEN) 100 UNIT/ML SOPN Inject 0.36 mLs (36 Units total) into the skin daily before breakfast. And pen needles 1/day 5 pen 2  . lidocaine (LIDODERM) 5 % Place 1 patch onto the skin daily as needed (pain). Remove & Discard patch within 12 hours or as directed by MD 30 patch 2  . Melatonin 10 MG CAPS Take 10 mg by mouth at bedtime.     . nortriptyline (PAMELOR) 10 MG capsule Start nortriptyline 11m at bedtime for 2 week, then increase to 2 tablet at bedtime 60 capsule 5  . QUEtiapine (SEROQUEL) 300 MG tablet Take 600 mg by mouth at bedtime.    .Marland KitchenrOPINIRole (REQUIP) 2 MG tablet Take 2 mg by mouth daily.    . tamsulosin (FLOMAX) 0.4 MG CAPS capsule Take 0.4 mg by mouth every morning.     . Tasimelteon (HETLIOZ) 20 MG CAPS Take 20 mg by mouth at bedtime.    .Minus LibertyTosylate (INGREZZA) 80 MG CAPS Take 80 mg by mouth daily.    .Marland Kitchenzolpidem (AMBIEN) 5 MG tablet Take 5 mg by mouth at bedtime as needed for sleep.    .Marland Kitchenglucose blood (FREESTYLE LITE) test strip Use to check blood sugars up to 4 times daily. Dx Code E11.65 100 each 3  . INVOKANA 300 MG TABS tablet TAKE 1 TABLET BY MOUTH EVERY DAY BEFORE BREAKFAST (Patient not taking: Reported on 04/09/2018) 30 tablet 2  . SYRINGE-NEEDLE, DISP, 3 ML (LUER LOCK SAFETY SYRINGES) 25G X 1" 3 ML MISC 1 Package by Does not apply route daily. 50 each 0  Drug  Regimen Review  Drug regimen was reviewed and remains appropriate with no significant issues identified  Home: Home Living Family/patient expects to be discharged to:: Private residence Living Arrangements: Spouse/significant other Available Help at Discharge: Family Type of Home: House Home Access: Ramped entrance, Stairs to enter [employer] of Steps: 2 Entrance Stairs-Rails: None Home Layout: One level Bathroom Shower/Tub: Chiropodist: Standard Bathroom Accessibility: No Home Equipment: Environmental consultant - 4 wheels   Functional History: Prior Function Level of Independence: Independent Comments: per husband, pt was independent with mobility prior to admission  Functional Status:  Mobility: Bed Mobility Overal bed mobility: Needs Assistance Bed Mobility: Supine to Sit Supine to sit: Supervision Sit to supine: Supervision General bed mobility comments: Pt performed bed mobility with cues for safety to avoid pulling on lines/leads.   Transfers Overall transfer level: Needs assistance Equipment used: Rolling walker (2 wheeled) Transfers: Sit to/from Stand Sit to Stand: Supervision Stand pivot transfers: Min assist General transfer comment: Cues for safe hand placement, patient tends to pull up on RW vs. pushing from seated surface.   Ambulation/Gait Ambulation/Gait assistance: Min guard, Min assist Ambulation Distance (Feet): 20 Feet(+ 10 ft.  ) Assistive device: Rolling walker (2 wheeled) Gait Pattern/deviations: Step-to pattern(hop to pattern with flexed posture.  ) General Gait Details: cues for posture and safe proximity to RW; adjusted RW height to improve leverage.  Pt fatigues quickly.   Gait velocity: decreased  ADL: ADL Overall ADL's : Needs assistance/impaired Eating/Feeding: Modified independent Grooming: Set up, Wash/dry hands, Wash/dry face, Sitting Grooming Details (indicate cue type and reason): in recliner Upper Body Bathing:  Set up, Sitting Lower Body Bathing: Minimal assistance Upper Body Dressing : Set up, Sitting Lower Body Dressing: Maximal assistance, Sitting/lateral leans Lower Body Dressing Details (indicate cue type and reason): able to do chair push up to don mesh underwear in recliner with lateral leans; max A to don residual limb guard Toilet Transfer: Minimal assistance, Stand-pivot, BSC, RW Toilet Transfer Details (indicate cue type and reason): pushes up on RW with stability from OT Toileting- Clothing Manipulation and Hygiene: Set up, Sitting/lateral lean Functional mobility during ADLs: Minimal assistance, Rolling walker, +2 for safety/equipment(SPT only) General ADL Comments: cognition impacting ability to perform ADL   Cognition: Cognition Overall Cognitive Status: Impaired/Different from baseline Orientation Level: Oriented X4 Cognition Arousal/Alertness: Suspect due to medications Behavior During Therapy: WFL for tasks assessed/performed Overall Cognitive Status: Impaired/Different from baseline Area of Impairment: Orientation, Attention, Memory, Safety/judgement, Problem solving Orientation Level: Disoriented to, Time Current Attention Level: Sustained Memory: Decreased short-term memory, Decreased recall of precautions Safety/Judgement: Decreased awareness of safety, Decreased awareness of deficits Problem Solving: Difficulty sequencing, Requires verbal cues General Comments: Pt drowsy throughout session, will awaken to her name or activity but shortly falls back asleep. Does not recall who worked with her yesterday or how to don residual limb protector.   Blood pressure 129/87, pulse 74, temperature 98.3 F (36.8 C), temperature source Oral, resp. rate 18, height '5\' 5"'  (1.651 m), weight 96 kg (211 lb 10.3 oz), SpO2 98 %. Physical Exam  Nursing note and vitals reviewed. Constitutional: She is oriented to person, place, and time. She appears well-developed and well-nourished.   HENT:  Head: Normocephalic and atraumatic.  Eyes: Pupils are equal, round, and reactive to light. Conjunctivae are normal.  Neck: Normal range of motion. Neck supple.  Cardiovascular: Normal rate and regular rhythm.  Respiratory: Effort normal and breath sounds normal. No respiratory distress.  GI: Soft. Bowel  sounds are normal. She exhibits no distension. There is no tenderness.  Musculoskeletal:  R-BKA covered with limb guard over  [place] VAC.   Neurological: She is alert and oriented to person, place, and time.  Skin: Skin is warm and dry.  Psychiatric: She has a normal mood and affect. Her behavior is normal. Thought content normal.    LabResultsLast48Hours  Results for orders placed or performed during the hospital encounter of 04/09/18 (from the past 48 hour(s))  Glucose, capillary     Status: Abnormal   Collection Time: 04/14/18  4:59 PM  Result Value Ref Range   Glucose-Capillary 189 (H) 65 - 99 mg/dL   Comment 1 Notify RN    Comment 2 Document in Chart   Glucose, capillary     Status: Abnormal   Collection Time: 04/14/18  8:08 PM  Result Value Ref Range   Glucose-Capillary 180 (H) 65 - 99 mg/dL  Glucose, capillary     Status: Abnormal   Collection Time: 04/15/18  6:04 AM  Result Value Ref Range   Glucose-Capillary 149 (H) 65 - 99 mg/dL  Glucose, capillary     Status: Abnormal   Collection Time: 04/15/18  1:28 PM  Result Value Ref Range   Glucose-Capillary 139 (H) 65 - 99 mg/dL  Glucose, capillary     Status: Abnormal   Collection Time: 04/15/18  4:25 PM  Result Value Ref Range   Glucose-Capillary 283 (H) 65 - 99 mg/dL   Comment 1 Notify RN    Comment 2 Document in Chart   Glucose, capillary     Status: Abnormal   Collection Time: 04/15/18  8:22 PM  Result Value Ref Range   Glucose-Capillary 202 (H) 65 - 99 mg/dL  Glucose, capillary     Status: Abnormal   Collection Time: 04/16/18 12:32 AM  Result Value Ref Range    Glucose-Capillary 214 (H) 65 - 99 mg/dL  Glucose, capillary     Status: None   Collection Time: 04/16/18  4:38 AM  Result Value Ref Range   Glucose-Capillary 83 65 - 99 mg/dL  Creatinine, serum     Status: None   Collection Time: 04/16/18  5:14 AM  Result Value Ref Range   Creatinine, Ser 0.51 0.44 - 1.00 mg/dL   GFR calc non Af Amer >60 >60 mL/min   GFR calc Af Amer >60 >60 mL/min    Comment: (NOTE) The eGFR has been calculated using the CKD EPI equation. This calculation has not been validated in all clinical situations. eGFR's persistently <60 mL/min signify possible Chronic Kidney Disease. Performed at [facility], [place] [address], [place], [place] [address]   Glucose, capillary     Status: Abnormal   Collection Time: 04/16/18  8:51 AM  Result Value Ref Range   Glucose-Capillary 169 (H) 65 - 99 mg/dL  Glucose, capillary     Status: Abnormal   Collection Time: 04/16/18 11:42 AM  Result Value Ref Range   Glucose-Capillary 178 (H) 65 - 99 mg/dL     ImagingResults(Last48hours)  No results found.       Medical Problem List and Plan: 1.  Functional deficits secondary to right BKA             -admit to inpatient rehab 2.  DVT Prophylaxis/Anticoagulation: Pharmaceutical: Lovenox 3. Pain Management: oxycodone prn. Educate patient on desensitization. Titrate gabapentin upwards as tolerated.              -limb guard for stump protection 4.  Mood: LCSW to follow for evaluation and support.  5. Neuropsych: This patient is capable of making decisions on her own behalf. 6. Skin/Wound Care: Monitor wound for healing. Routine pressure relief measures.  7. Fluids/Electrolytes/Nutrition: Monitor I/O. Check lytes in am.  8.T2DM: Monitor BS ac/hs. Continue Lantus (basaglar 36 units) at bedtime with SSI. Resume meal coverage at 5 units tid ac.  9. Step agalactiae sepsis: TH recommends changing IV ampicillin to amoxicillin for 14 day total antibiotic regimen.    10. Bipolar disorder/Personality disorder: On Seroquel at bedtime.   Post Admission Physician Evaluation: 1. Functional deficits secondary  to right BKA. 2. Patient is admitted to receive collaborative, interdisciplinary care between the physiatrist, rehab nursing staff, and therapy team. 3. Patient's level of medical complexity and substantial therapy needs in context of that medical necessity cannot be provided at a lesser intensity of care such as a SNF. 4. Patient has experienced substantial functional loss from his/her baseline which was documented above under the "Functional History" and "Functional Status" headings.  Judging by the patient's diagnosis, physical exam, and functional history, the patient has potential for functional progress which will result in measurable gains while on inpatient rehab.  These gains will be of substantial and practical use upon discharge  in facilitating mobility and self-care at the household level. 5. Physiatrist will provide 24 hour management of medical needs as well as oversight of the therapy plan/treatment and provide guidance as appropriate regarding the interaction of the two. 6. The Preadmission Screening has been reviewed and patient status is unchanged unless otherwise stated above. 7. 24 hour rehab nursing will assist with bladder management, bowel management, safety, skin/wound care, disease management, medication administration, pain management and patient education  and help integrate therapy concepts, techniques,education, etc. 8. PT will assess and treat for/with: Lower extremity strength, range of motion, stamina, balance, functional mobility, safety, adaptive techniques and equipment, pre-prosthetic education, pain control, wound care.   Goals are: mod I. 9. OT will assess and treat for/with: ADL's, functional mobility, safety, upper extremity strength, adaptive techniques and equipment, pain mgt, pre-prosthetic education.   Goals are: mod I.  Therapy may  proceed with showering this patient if limb is covered.. 10. SLP will assess and treat for/with: n/a.  Goals are: n/a. 11. Case Management and Social Worker will assess and treat for psychological issues and discharge planning. 12. Team conference will be held weekly to assess progress toward goals and to determine barriers to discharge. 13. Patient will receive at least 3 hours of therapy per day at least 5 days per week. 14. ELOS: 7-10 days       15. Prognosis:  excellent    I have personally performed a face to face diagnostic evaluation of this patient. Additionally, I have reviewed and concur with the physician assistant's documentation above.  Meredith Staggers, MD, Mellody Drown  04/16/2018

## 2018-04-16 NOTE — Discharge Instructions (Signed)
Further care per CIR

## 2018-04-16 NOTE — Consult Note (Signed)
Physical Medicine and Rehabilitation Consult   Reason for Consult: R- BKA Referring Physician: Dr. Sharol Given   HPI: [name] is a 52 y.o. female with history of COPD, OSA, poorly controlled DM with insensate neuropathy and retinopathy, ongoing tobacco use-3-4 PPD, BLE edema, charcot foot who was admitted via ED on 04/09/18 with weakness, mental status changes and worsening of foot wound. She was found to have step agalactiae sepsis and was started on IV antibiotics for treatment. MRI foot revealed early osteomyelitis with abscess and she underwent I & D  Right foot wound without improvement in healing.  Dr. Sharol Given was consulted for input and recommended BKA. She underwent BKA on 04/19 with recommendations for tobacco cessation and better diabetes control. Therapy evaluation done this weekend and CIR recommended due to functional deficits.     Review of Systems  Constitutional: Negative for chills and fever.  HENT: Negative for tinnitus.   Eyes: Positive for blurred vision (legally blind? has difficulty reading. ).  Respiratory: Negative for cough and shortness of breath.   Cardiovascular: Negative for chest pain and palpitations.  Gastrointestinal: Positive for nausea. Negative for abdominal pain and heartburn.  Genitourinary: Negative for dysuria and urgency.  Musculoskeletal: Positive for falls.  Skin: Negative for itching and rash.  Neurological: Positive for sensory change. Negative for dizziness and headaches.  Psychiatric/Behavioral: The patient has insomnia.       Past Medical History:  Diagnosis Date  . Anxiety   . Arthritis   . Bipolar disorder ([place])   . Broken jaw ([place])   . Broken wrist   . Chronic back pain   . Claudication ([place])    a. 12/2013 ABI's: R 0.97, L 0.94.  [name] COPD (chronic obstructive pulmonary disease) ([place])   . Depression   . Diabetic Charcot's foot ([place])   . Diabetic foot ulcer ([facility])    chronic left foot ulcer , great toe/  hx recurrent foot  ulcer bilaterally  . DJD (degenerative joint disease)   . Edema of both lower extremities   . Episode of memory loss   . GERD (gastroesophageal reflux disease)   . Headache(784.0)   . Hiatal hernia   . History of colon cancer, stage I dx 02-26-2015--- oncologist-- dr [name]--- per last note no recurrence   05-16-2015 s/p  Laparoscopic sigmoid colectomy w/ node bx's (negative nodes per path)  Stage I (pT1,N0,M0) Grade 2  . 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   per MRI multiple small strokes post cardiac cath. --  residual mild memory loss  . History of methicillin resistant staphylococcus aureus (MRSA)   . Hypercholesterolemia   . Insomnia   . Insulin dependent type 2 diabetes mellitus, uncontrolled ([place]) dx 2004   endocrinologist-  dr Dwyane Dee-- last A1c 8.8 in Aug2018:  pt is noncompliant w/ diet, states does note eat breakfast , her first main meal in afternoon  . Neurogenic bladder   . Neuropathy, diabetic ([place])    hands and feet  . OSA (obstructive sleep apnea)    cpap intolerant  . Personality disorder ([place])   . SOB (shortness of breath) on exertion   . Stroke ([place])   . SUI (stress urinary incontinence, female) S/P SLING 12-29-2011    Past Surgical History:  Procedure Laterality Date  . ABDOMINAL HYSTERECTOMY    . AMPUTATION Right 04/13/2018   Procedure: RIGHT BELOW KNEE AMPUTATION;  Surgeon: Newt Minion, MD;  Location: [facility];  Service: Orthopedics;  Laterality: Right;  . ANTERIOR CERVICAL DECOMP/DISCECTOMY FUSION  2000   C5 - 7  . CARDIAC CATHETERIZATION  05-22-2008   DR SKAINS   NO SIGNIFECANT CAD/ NORMAL LV/  EF 65%/  NO WALL MOTION ABNORMALITIES  . CARPAL TUNNEL RELEASE Right 04-25-2013  . [place]; 1992  . COLONOSCOPY    . CYSTO N/A 04/30/2013   Procedure: CYSTOSCOPY;  Surgeon: Reece Packer, MD;  Location: [facility];  Service: Urology;  Laterality: N/A;  . CYSTOSCOPY MACROPLASTIQUE IMPLANT N/A 02/06/2018   Procedure:  CYSTOSCOPY MACROPLASTIQUE IMPLANT;  Surgeon: Bjorn Loser, MD;  Location: [facility];  Service: Urology;  Laterality: N/A;  . CYSTOSCOPY WITH INJECTION  05/04/2012   Procedure: CYSTOSCOPY WITH INJECTION;  Surgeon: Reece Packer, MD;  Location: [facility];  Service: Urology;  Laterality: N/A;  MACROPLASTIQUE INJECTION  . CYSTOSCOPY WITH INJECTION  08/28/2012   Procedure: CYSTOSCOPY WITH INJECTION;  Surgeon: Reece Packer, MD;  [facility];  Service: Urology;  Laterality: N/A;  cysto and macroplastique   . FOOT SURGERY Bilateral   . HERNIA REPAIR  ?1996   "stomach"  . INCISION AND DRAINAGE OF WOUND Right 04/11/2018   Procedure: IRRIGATION AND DEBRIDEMENT WOUND right foot and right ankle;  Surgeon: Evelina Bucy, DPM;  Location: [facility];  Service: Podiatry;  Laterality: Right;  . KNEE ARTHROSCOPY W/ ALLOGRAFT IMPANT Left    graft x 2  . KNEE SURGERY     TOTAL 8 SURG'S  . LAPAROSCOPIC SIGMOID COLECTOMY N/A 04/22/2015   Procedure: LAPAROSCOPIC HAND ASSISTED SIGMOID COLECTOMY;  Surgeon: Erroll Luna, MD;  Location: [facility];  Service: General;  Laterality: N/A;  . LEFT HEART CATHETERIZATION WITH CORONARY ANGIOGRAM N/A 02/12/2014   Procedure: LEFT HEART CATHETERIZATION WITH CORONARY ANGIOGRAM;  Surgeon: Candee Furbish, MD;  Location[facility];  Service: Cardiovascular;  Laterality: N/A;   No angiographically significant CAD; normal LVSF, LVEDP 41mmHg,  EF 55% (new finding ef 30% myoview 01-08-2014)  . LUMBAR FUSION    . MANDIBLE FRACTURE SURGERY    . MULTIPLE LAPAROSCOPIES FOR ENDOMETRIOSIS    . PUBOVAGINAL SLING  12/29/2011   Procedure: Gaynelle Arabian;  Surgeon: Reece Packer, MD;  Location[facility];  Service: Urology;  Laterality: N/A;  cysto and sparc sling   . PUBOVAGINAL SLING N/A 04/30/2013   Procedure: REMOVAL OF VAGINAL MESH;  Surgeon: Reece Packer, MD;  Location[facility];  Service: Urology;   Laterality: N/A;  . RECONSTURCTION OF CONGENITAL UTERUS ANOMALY  1983  . REPEAT RECONSTRUCTION ACL LEFT KNEE/ SCREWS REMOVED  03-28-2000   CADAVER GRAFT  . TOTAL ABDOMINAL HYSTERECTOMY W/ BILATERAL SALPINGOOPHORECTOMY  1997  . TRANSTHORACIC ECHOCARDIOGRAM  02/13/2014   ef 45%, hypokinesis base inferior and base inferolateral walls  . UPPER GI ENDOSCOPY    . WOUND DEBRIDEMENT Right 09/05/2016   Procedure: DEBRIDEMENT WOUND WITH GRAFT RIGHT FOOT;  Surgeon: Landis Martins, DPM;  Location: [facility];  Service: Podiatry;  Laterality: Right;  . WRIST FRACTURE SURGERY      Family History  Problem Relation Age of Onset  . Hypertension Mother   . Diabetes Mother   . Cancer - Other Mother        lymphoma   . Cancer - Other Father        lung, bladder cancer   . Heart attack Father   . Cancer - Other Brother  bladder cancer     Social History:  Married. Independent with AD PTA. Husband disabled and limited due to stroke and cardiac issues. She  reports that she has been smoking cigarettes.  She has a 180.00 pack-year smoking history. She has never used smokeless tobacco. She reports that she does not drink alcohol or use drugs.    Allergies  Allergen Reactions  . Latex Itching, Rash and Other (See Comments)    Pt states she cannot use condoms - cause an infection.  Use of latex on skin is okay.  Tape causes rash  . Sweetening Enhancer [Flavoring Agent] Nausea And Vomiting and Other (See Comments)    HEADACHES  . Aspartame And Phenylalanine Nausea And Vomiting    HEADACHES  . Ibuprofen Other (See Comments)    HEADACHES  . Trazodone And Nefazodone Other (See Comments)    Hallucinations   . Triazolam Other (See Comments)    HALLUCINATIONS    Medications Prior to Admission  Medication Sig Dispense Refill  . Cariprazine HCl (VRAYLAR) 4.5 MG CAPS Take 4.5 mg by mouth at bedtime.    . cyclobenzaprine (FLEXERIL) 10 MG tablet Take 1 tablet (10 mg total) by mouth 3 (three) times daily as  needed for muscle spasms. 90 tablet 2  . empagliflozin (JARDIANCE) 25 MG TABS tablet Take 25 mg by mouth daily. 90 tablet 0  . gabapentin (NEURONTIN) 600 MG tablet TAKE 1 TABLET (600 MG TOTAL) BY MOUTH 2 (TWO) TIMES DAILY. 60 tablet 5  . insulin aspart (NOVOLOG) 100 UNIT/ML FlexPen Take 10 units before your main meal. If blood sugars are consistently over 250 after the meal start taking 14 units before your main meal 35 UNITS AT SUPPER 45 mL 2  . Insulin Glargine (BASAGLAR KWIKPEN) 100 UNIT/ML SOPN Inject 0.36 mLs (36 Units total) into the skin daily before breakfast. And pen needles 1/day 5 pen 2  . lidocaine (LIDODERM) 5 % Place 1 patch onto the skin daily as needed (pain). Remove & Discard patch within 12 hours or as directed by MD 30 patch 2  . Melatonin 10 MG CAPS Take 10 mg by mouth at bedtime.     . nortriptyline (PAMELOR) 10 MG capsule Start nortriptyline 10mg  at bedtime for 2 week, then increase to 2 tablet at bedtime 60 capsule 5  . QUEtiapine (SEROQUEL) 300 MG tablet Take 600 mg by mouth at bedtime.    [name] rOPINIRole (REQUIP) 2 MG tablet Take 2 mg by mouth daily.    . tamsulosin (FLOMAX) 0.4 MG CAPS capsule Take 0.4 mg by mouth every morning.     . Tasimelteon (HETLIOZ) 20 MG CAPS Take 20 mg by mouth at bedtime.    Minus [place] Tosylate (INGREZZA) 80 MG CAPS Take 80 mg by mouth daily.    [name] zolpidem (AMBIEN) 5 MG tablet Take 5 mg by mouth at bedtime as needed for sleep.    [name] glucose blood (FREESTYLE LITE) test strip Use to check blood sugars up to 4 times daily. Dx Code E11.65 100 each 3  . INVOKANA 300 MG TABS tablet TAKE 1 TABLET BY MOUTH EVERY DAY BEFORE BREAKFAST (Patient not taking: Reported on 04/09/2018) 30 tablet 2  . SYRINGE-NEEDLE, DISP, 3 ML (LUER LOCK SAFETY SYRINGES) 25G X 1" 3 ML MISC 1 Package by Does not apply route daily. 45 each 0    Home: Home Living Family/patient expects to be discharged to:: Private residence Living Arrangements: Spouse/significant other Available  Help at Discharge: Family Type of  Home: House Home Access: Ramped entrance, Stairs to enter [employer] of Steps: 2 Entrance Stairs-Rails: None Home Layout: One level Bathroom Shower/Tub: Chiropodist: Standard Bathroom Accessibility: No Home Equipment: Environmental consultant - 4 wheels  Functional History: Prior Function Level of Independence: Independent Comments: per husband, pt was independent with mobility prior to admission Functional Status:  Mobility: Bed Mobility Overal bed mobility: Needs Assistance Bed Mobility: Supine to Sit Supine to sit: Supervision Sit to supine: Min assist, HOB elevated General bed mobility comments: pt OOB in chair upon arrival Transfers Overall transfer level: Needs assistance Equipment used: Rolling walker (2 wheeled) Transfers: Sit to/from Stand Sit to Stand: Supervision Stand pivot transfers: Min assist General transfer comment: cues for safe hand placement and to get closer to seated surface prior to attempting to sit down; pt attempted to stand initially without RW  Ambulation/Gait Ambulation/Gait assistance: Min guard, Min assist Ambulation Distance (Feet): (26ft X2) Assistive device: Rolling walker (2 wheeled) Gait Pattern/deviations: Step-to pattern General Gait Details: cues for posture and safe proximity to RW; assist for balance at times due to posterior bias  Gait velocity: decreased    ADL: ADL Overall ADL's : Needs assistance/impaired Eating/Feeding: Modified independent Grooming: Set up, Wash/dry hands, Wash/dry face, Sitting Grooming Details (indicate cue type and reason): in recliner Upper Body Bathing: Set up, Sitting Lower Body Bathing: Minimal assistance Upper Body Dressing : Set up, Sitting Lower Body Dressing: Maximal assistance, Sitting/lateral leans Lower Body Dressing Details (indicate cue type and reason): able to do chair push up to don mesh underwear in recliner with lateral leans; max A to  don residual limb guard Toilet Transfer: Minimal assistance, Stand-pivot, BSC, RW Toilet Transfer Details (indicate cue type and reason): pushes up on RW with stability from OT Toileting- Clothing Manipulation and Hygiene: Set up, Sitting/lateral lean Functional mobility during ADLs: Minimal assistance, Rolling walker, +2 for safety/equipment(SPT only) General ADL Comments: cognition impacting ability to perform ADL   Cognition: Cognition Overall Cognitive Status: Impaired/Different from baseline Orientation Level: Oriented X4 Cognition Arousal/Alertness: Suspect due to medications Behavior During Therapy: WFL for tasks assessed/performed Overall Cognitive Status: Impaired/Different from baseline Area of Impairment: Orientation, Attention, Memory, Safety/judgement, Problem solving Orientation Level: Disoriented to, Time Current Attention Level: Sustained Memory: Decreased short-term memory, Decreased recall of precautions Safety/Judgement: Decreased awareness of safety, Decreased awareness of deficits Problem Solving: Difficulty sequencing, Requires verbal cues General Comments: Pt drowsy throughout session, will awaken to her name or activity but shortly falls back asleep. Does not recall who worked with her yesterday or how to don residual limb protector.   Blood pressure (!) 100/54, pulse 75, temperature 98.2 F (36.8 C), temperature source Oral, resp. rate 16, height 5\' 5"  (1.651 m), weight 96 kg (211 lb 10.3 oz), SpO2 (!) 87 %. Physical Exam  Nursing note and vitals reviewed. Constitutional: She is oriented to person, place, and time. She appears well-developed and well-nourished. No distress.  HENT:  Head: Normocephalic and atraumatic.  Mouth/Throat: Oropharynx is clear and moist.  Eyes: Pupils are equal, round, and reactive to light. Conjunctivae and EOM are normal.  Neck: Normal range of motion. Neck supple.  Cardiovascular: Normal rate and regular rhythm.  Respiratory:  Effort normal and breath sounds normal. No respiratory distress. She has no wheezes.  GI: Soft. Bowel sounds are normal. She exhibits no distension. There is no tenderness.  Musculoskeletal: She exhibits tenderness. She exhibits no edema.  Right stump with limb guard. Tender to palpation.   Neurological: She is alert  and oriented to person, place, and time.  UE 5/5. RLE 3- to 3/5. LLE: 4/5. Decreased LT left foot  Skin: Skin is warm and dry.  Psychiatric: She has a normal mood and affect. Her behavior is normal.    Results for orders placed or performed during the hospital encounter of 04/09/18 (from the past 24 hour(s))  Glucose, capillary     Status: Abnormal   Collection Time: 04/15/18  1:28 PM  Result Value Ref Range   Glucose-Capillary 139 (H) 65 - 99 mg/dL  Glucose, capillary     Status: Abnormal   Collection Time: 04/15/18  4:25 PM  Result Value Ref Range   Glucose-Capillary 283 (H) 65 - 99 mg/dL   Comment 1 Notify RN    Comment 2 Document in Chart   Glucose, capillary     Status: Abnormal   Collection Time: 04/15/18  8:22 PM  Result Value Ref Range   Glucose-Capillary 202 (H) 65 - 99 mg/dL  Creatinine, serum     Status: None   Collection Time: 04/16/18  5:14 AM  Result Value Ref Range   Creatinine, Ser 0.51 0.44 - 1.00 mg/dL   GFR calc non Af Amer >60 >60 mL/min   GFR calc Af Amer >60 >60 mL/min   No results found.   Assessment/Plan: Diagnosis: right BKA 1. Does the need for close, 24 hr/day medical supervision in concert with the patient's rehab needs make it unreasonable for this patient to be served in a less intensive setting? Yes 2. Co-Morbidities requiring supervision/potential complications: pain mgt, diabetes 2, depression, recent sepsis, peripheral neuropathy 3. Due to bladder management, bowel management, safety, skin/wound care, disease management, medication administration, pain management and patient education, does the patient require 24 hr/day rehab  nursing? Yes 4. Does the patient require coordinated care of a physician, rehab nurse, PT (1-2 hrs/day, 5 days/week) and OT (1-2 hrs/day, 5 days/week) to address physical and functional deficits in the context of the above medical diagnosis(es)? Yes Addressing deficits in the following areas: balance, endurance, locomotion, strength, transferring, bowel/bladder control, bathing, dressing, feeding, grooming, toileting and psychosocial support 5. Can the patient actively participate in an intensive therapy program of at least 3 hrs of therapy per day at least 5 days per week? Yes 6. The potential for patient to make measurable gains while on inpatient rehab is excellent 7. Anticipated functional outcomes upon discharge from inpatient rehab are modified independent  with PT, modified independent and supervision with OT, n/a with SLP. 8. Estimated rehab length of stay to reach the above functional goals is: 7-10 days 9. Anticipated D/C setting: Home 10. Anticipated post D/C treatments: [place] therapy 11. Overall Rehab/Functional Prognosis: excellent  RECOMMENDATIONS: This patient's condition is appropriate for continued rehabilitative care in the following setting: CIR Patient has agreed to participate in recommended program. Potentially Note that insurance prior authorization may be required for reimbursement for recommended care.  Comment: Rehab Admissions Coordinator to follow up.  Thanks,  Meredith Staggers, MD, Mellody Drown     Bary Leriche, PA-C 04/16/2018

## 2018-04-17 ENCOUNTER — Other Ambulatory Visit: Payer: Self-pay

## 2018-04-17 ENCOUNTER — Telehealth: Payer: Self-pay | Admitting: Sports Medicine

## 2018-04-17 ENCOUNTER — Inpatient Hospital Stay (HOSPITAL_COMMUNITY): Payer: Self-pay

## 2018-04-17 ENCOUNTER — Inpatient Hospital Stay (HOSPITAL_COMMUNITY): Payer: Self-pay | Admitting: Occupational Therapy

## 2018-04-17 LAB — COMPREHENSIVE METABOLIC PANEL
ALT: 14 U/L (ref 14–54)
AST: 17 U/L (ref 15–41)
Albumin: 2 g/dL — ABNORMAL LOW (ref 3.5–5.0)
Alkaline Phosphatase: 163 U/L — ABNORMAL HIGH (ref 38–126)
Anion gap: 7 (ref 5–15)
BILIRUBIN TOTAL: 0.3 mg/dL (ref 0.3–1.2)
BUN: 5 mg/dL — AB (ref 6–20)
CO2: 28 mmol/L (ref 22–32)
Calcium: 8.2 mg/dL — ABNORMAL LOW (ref 8.9–10.3)
Chloride: 103 mmol/L (ref 101–111)
Creatinine, Ser: 0.57 mg/dL (ref 0.44–1.00)
GFR calc Af Amer: >60 mL/min (ref >60)
Glucose, Bld: 121 mg/dL — ABNORMAL HIGH (ref 65–99)
POTASSIUM: 3.7 mmol/L (ref 3.5–5.1)
Sodium: 138 mmol/L (ref 135–145)
Total Protein: 5.9 g/dL — ABNORMAL LOW (ref 6.5–8.1)

## 2018-04-17 LAB — CULTURE, BLOOD (ROUTINE X 2)
CULTURE: NO GROWTH
Culture: NO GROWTH
Special Requests: ADEQUATE
Special Requests: ADEQUATE

## 2018-04-17 LAB — CBC WITH DIFFERENTIAL/PLATELET
Basophils Absolute: 0 10*3/uL (ref 0.0–0.1)
Basophils Relative: 0 %
EOS PCT: 2 %
Eosinophils Absolute: 0.3 10*3/uL (ref 0.0–0.7)
HEMATOCRIT: 32.7 % — AB (ref 36.0–46.0)
Hemoglobin: 10.4 g/dL — ABNORMAL LOW (ref 12.0–15.0)
LYMPHS ABS: 3.7 10*3/uL (ref 0.7–4.0)
Lymphocytes Relative: 27 %
MCH: 30.4 pg (ref 26.0–34.0)
MCHC: 31.8 g/dL (ref 30.0–36.0)
MCV: 95.6 fL (ref 78.0–100.0)
MONOS PCT: 4 %
Monocytes Absolute: 0.5 10*3/uL (ref 0.1–1.0)
NEUTROS ABS: 9.1 10*3/uL — AB (ref 1.7–7.7)
Neutrophils Relative %: 67 %
Platelets: 391 10*3/uL (ref 150–400)
RBC: 3.42 MIL/uL — AB (ref 3.87–5.11)
RDW: 14.4 % (ref 11.5–15.5)
WBC: 13.6 10*3/uL — ABNORMAL HIGH (ref 4.0–10.5)

## 2018-04-17 LAB — GLUCOSE, CAPILLARY
GLUCOSE-CAPILLARY: 117 mg/dL — AB (ref 65–99)
GLUCOSE-CAPILLARY: 135 mg/dL — AB (ref 65–99)
Glucose-Capillary: 175 mg/dL — ABNORMAL HIGH (ref 65–99)
Glucose-Capillary: 147 mg/dL — ABNORMAL HIGH (ref 65–99)
Glucose-Capillary: 164 mg/dL — ABNORMAL HIGH (ref 65–99)

## 2018-04-17 NOTE — Evaluation (Signed)
Physical Therapy Assessment and Plan  Patient Details  Name: [name] MRN: [id number] Date of Birth: 1966-09-04  PT Diagnosis: Difficulty walking, Edema, Impaired sensation, Muscle weakness and Pain in joint Rehab Potential: Good ELOS: 7-10 days   Today's Date: 04/17/2018 PT Individual Time: 0930-1100 PT Individual Time Calculation (min): 90 min    Problem List:  Patient Active Problem List   Diagnosis Date Noted  . 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 PAD (peripheral artery disease) ([place])   . Poorly controlled type 2 diabetes mellitus ([place])   . Cutaneous abscess of right foot   . Subacute osteomyelitis, right ankle and foot ([place])   . Major depressive disorder, recurrent episode ([place]) 04/11/2018  . Diabetic ulcer of right midfoot associated with type 2 diabetes mellitus, with necrosis of bone ([place])   . Abscess of ankle   . Infectious synovitis   . Diabetic foot infection ([place])   . Severe sepsis ([place]) 04/09/2018  . Cancer of sigmoid colon ([place]) 03/09/2015  . OSA (obstructive sleep apnea) 07/04/2014  . Migraine without aura, with intractable migraine, so stated, without mention of status migrainosus 03/26/2014  . Peripheral neuropathy 03/03/2014  . Abnormal stress test 02/12/2014  . CVA (cerebral infarction) 02/12/2014  . Chest pain   . Abnormal heart rhythm   . Edema   . Hypertension   . Hyperlipemia   . Hypercholesterolemia   . Other and unspecified hyperlipidemia 11/14/2013  . Type II diabetes mellitus, uncontrolled ([place]) 07/22/2013  . Essential hypertension 07/22/2013  . 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 Diabetic polyneuropathy ([place]) 07/22/2013    Past Medical History:  Past Medical History:  Diagnosis Date  . Anxiety   . Arthritis   . Bipolar disorder ([place])   . Broken jaw ([place])   . Broken wrist   . Chronic back pain   . Claudication ([place])    a. 12/2013 ABI's: R 0.97, L 0.94.   [name] COPD (chronic obstructive pulmonary disease) ([place])   . Depression   . Diabetic Charcot's foot ([place])   . Diabetic foot ulcer ([facility])    chronic left foot ulcer , great toe/  hx recurrent foot ulcer bilaterally  . DJD (degenerative joint disease)   . Edema of both lower extremities   . Episode of memory loss   . GERD (gastroesophageal reflux disease)   . Headache(784.0)   . Hiatal hernia   . History of colon cancer, stage I dx 02-26-2015--- oncologist-- dr [name]--- per last note no recurrence   05-16-2015 s/p  Laparoscopic sigmoid colectomy w/ node bx's (negative nodes per path)  Stage I (pT1,N0,M0) Grade 2  . 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   per MRI multiple small strokes post cardiac cath. --  residual mild memory loss  . History of methicillin resistant staphylococcus aureus (MRSA)   . Hypercholesterolemia   . Insomnia   . Insulin dependent type 2 diabetes mellitus, uncontrolled ([place]) dx 2004   endocrinologist-  dr Dwyane Dee-- last A1c 8.8 in Aug2018:  pt is noncompliant w/ diet, states does note eat breakfast , her first main meal in afternoon  . Neurogenic bladder   . Neuropathy, diabetic ([place])    hands and feet  . OSA (obstructive sleep apnea)    cpap intolerant  . Personality disorder ([place])   . SOB (shortness of breath) on exertion   . Stroke ([place])   . SUI (stress urinary  incontinence, female) S/P [facility] 12-29-2011   Past Surgical History:  Past Surgical History:  Procedure Laterality Date  . ABDOMINAL HYSTERECTOMY    . AMPUTATION Right 04/13/2018   Procedure: RIGHT BELOW KNEE AMPUTATION;  Surgeon: Newt Minion, MD;  Location: [facility];  Service: Orthopedics;  Laterality: Right;  . ANTERIOR CERVICAL DECOMP/DISCECTOMY FUSION  2000   C5 - 7  . CARDIAC CATHETERIZATION  05-22-2008   DR SKAINS   NO SIGNIFECANT CAD/ NORMAL LV/  EF 65%/  NO WALL MOTION ABNORMALITIES  . CARPAL TUNNEL RELEASE Right 04-25-2013  . [place]; 1992  .  COLONOSCOPY    . CYSTO N/A 04/30/2013   Procedure: CYSTOSCOPY;  Surgeon: Reece Packer, MD;  Location: [facility];  Service: Urology;  Laterality: N/A;  . CYSTOSCOPY MACROPLASTIQUE IMPLANT N/A 02/06/2018   Procedure: CYSTOSCOPY MACROPLASTIQUE IMPLANT;  Surgeon: Bjorn Loser, MD;  [facility];  Service: Urology;  Laterality: N/A;  . CYSTOSCOPY WITH INJECTION  05/04/2012   Procedure: CYSTOSCOPY WITH INJECTION;  Surgeon: Reece Packer, MD;  Location: [place];  Service: Urology;  Laterality: N/A;  MACROPLASTIQUE INJECTION  . CYSTOSCOPY WITH INJECTION  08/28/2012   Procedure: CYSTOSCOPY WITH INJECTION;  Surgeon: Reece Packer, MD;  Location: [facility];  Service: Urology;  Laterality: N/A;  cysto and macroplastique   . FOOT SURGERY Bilateral   . HERNIA REPAIR  ?1996   "stomach"  . INCISION AND DRAINAGE OF WOUND Right 04/11/2018   Procedure: IRRIGATION AND DEBRIDEMENT WOUND right foot and right ankle;  Surgeon: Evelina Bucy, DPM;  Location: [facility];  Service: Podiatry;  Laterality: Right;  . KNEE ARTHROSCOPY W/ ALLOGRAFT IMPANT Left    graft x 2  . KNEE SURGERY     TOTAL 8 SURG'S  . LAPAROSCOPIC SIGMOID COLECTOMY N/A 04/22/2015   Procedure: LAPAROSCOPIC HAND ASSISTED SIGMOID COLECTOMY;  Surgeon: Erroll Luna, MD;  Location: [place];  Service: General;  Laterality: N/A;  . LEFT HEART CATHETERIZATION WITH CORONARY ANGIOGRAM N/A 02/12/2014   Procedure: LEFT HEART CATHETERIZATION WITH CORONARY ANGIOGRAM;  Surgeon: Candee Furbish, MD;  Location: [facility];  Service: Cardiovascular;  Laterality: N/A;   No angiographically significant CAD; normal LVSF, LVEDP 12mHg,  EF 55% (new finding ef 30% myoview 01-08-2014)  . LUMBAR FUSION    . MANDIBLE FRACTURE SURGERY    . MULTIPLE LAPAROSCOPIES FOR ENDOMETRIOSIS    . PUBOVAGINAL SLING  12/29/2011   Procedure: PGaynelle Arabian  Surgeon: SReece Packer MD;  [facility]  Service: Urology;  Laterality: N/A;  cysto and sparc sling   . PUBOVAGINAL SLING N/A 04/30/2013   Procedure: REMOVAL OF VAGINAL MESH;  Surgeon: SReece Packer MD;  [facility];  Service: Urology;  Laterality: N/A;  . RECONSTURCTION OF CONGENITAL UTERUS ANOMALY  1983  . REPEAT RECONSTRUCTION ACL LEFT KNEE/ SCREWS REMOVED  03-28-2000   CADAVER GRAFT  . TOTAL ABDOMINAL HYSTERECTOMY W/ BILATERAL SALPINGOOPHORECTOMY  1997  . TRANSTHORACIC ECHOCARDIOGRAM  02/13/2014   ef 45%, hypokinesis base inferior and base inferolateral walls  . UPPER GI ENDOSCOPY    . WOUND DEBRIDEMENT Right 09/05/2016   Procedure: DEBRIDEMENT WOUND WITH GRAFT RIGHT FOOT;  Surgeon: TLandis Martins DPM;  [place]  Service: Podiatry;  Laterality: Right;  . WRIST FRACTURE SURGERY      Assessment & Plan Clinical Impression: Patient is a 52year old failure female with history of COPD with ongoing tobacco use 3  packs a day,obstructive sleep apnea,poorly controlled diabetes mellitus with insensate neuropathy and retinopathy,bilateral lower extremity edema,Charcot foot;who was admitted via ED on 04/09/2018 with weakness mental status changes and worsening of foot wound. She was found to have strep agalactiaesepsis and was started on IV antibiotics for treatment.MRI of the foot revealed early osteomyelitis with with abscess formation and she underwent I&D of right foot wound without improvement in healing. Dr. 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 Therapy evaluations done and patient was noted to have functional deficits.  Patient transferred to CIR on 04/16/2018 .   Patient currently requires min to light mod assist with mobility secondary to muscle weakness and muscle joint tightness, decreased cardiorespiratoy endurance, decreased safety awareness and decreased memory and decreased sitting  balance, decreased standing balance and decreased balance strategies.  Prior to hospitalization, patient was independent  with mobility and lived with Spouse in a House home.  Home access is  Ramped entrance, Stairs to enter.  Patient will benefit from skilled PT intervention to maximize safe functional mobility, minimize fall risk and decrease caregiver burden for planned discharge home with 24 hour supervision.  Anticipate patient will benefit from follow up [place] at discharge.  PT - End of Session Activity Tolerance: Decreased this session Endurance Deficit: Yes PT Assessment Rehab Potential (ACUTE/IP ONLY): Good PT Patient demonstrates impairments in the following area(s): Balance;Edema;Endurance;Motor;Pain;Safety;Sensory;Skin Integrity PT Transfers Functional Problem(s): Bed to Chair;Bed Mobility;Car;Furniture PT Locomotion Functional Problem(s): Ambulation;Wheelchair Mobility PT Plan PT Intensity: Minimum of 1-2 x/day ,45 to 90 minutes PT Frequency: 5 out of 7 days PT Duration Estimated Length of Stay: 7-10 days PT Treatment/Interventions: Ambulation/gait training;Balance/vestibular training;Community reintegration;Discharge planning;Disease management/prevention;DME/adaptive equipment instruction;Neuromuscular re-education;Functional mobility training;Pain management;Patient/family education;Splinting/orthotics;Psychosocial support;Skin care/wound management;Therapeutic Activities;Therapeutic Exercise;UE/LE Strength taining/ROM;UE/LE Coordination activities;Wheelchair propulsion/positioning PT Transfers Anticipated Outcome(s): mod I to supervision basic transfers PT Locomotion Anticipated Outcome(s): mod I w/c mobility; supervision gait with RW PT Recommendation Recommendations for Other Services: Neuropsych consult Follow Up Recommendations: Home health PT Patient destination: Home Equipment Recommended: Rolling walker with 5" wheels;Wheelchair (measurements);Wheelchair cushion  (measurements) Equipment Details: 18x18 w/c with R amputee pad;  Skilled Therapeutic Intervention Evaluation completed (see details above and below) with education on PT POC and goals and individual treatment initiated with focus on functional transfers (bed <> w/c, sit <> stands, and simulated car transfer) with and without AD with focus on safety and cues for technique, initiation of gait training with RW for short distance ambulation with cues for efficient technique and min assist for balance, w/c parts management training and propulsion training with cues for technique and demonstrates decreased functional UE muscular endurance and quickly fatigues, and initiated education on positioning of residual limb, prosthetic process, and overall importance of safety and skin integrity especially with impaired sensation in LLE. Handout issued for home measurement accessibility.  PT Evaluation Precautions/Restrictions Precautions Precautions: Fall Precaution Comments: wound vac R; new R BKA Required Braces or Orthoses: Other Brace/Splint Other Brace/Splint: Limb guard for RLE Restrictions Weight Bearing Restrictions: Yes RLE Weight Bearing: Non weight bearing LLE Weight Bearing: Weight bearing as tolerated General   Vital Signs Pain Premedicated for pain in R residual limb.  Home Living/Prior Functioning Home Living Available Help at Discharge: Family Type of Home: House Home Access: Ramped entrance;Stairs to enter Home Layout: One level Additional Comments: husband already has ramp installed for home access; home measurement sheet issued on eval  Lives With: Spouse Prior  Function Level of Independence: Independent with gait;Independent with transfers;Independent with basic ADLs  Able to Take Stairs?: Yes Vision/Perception     Cognition Orientation Level: Oriented X4 Safety/Judgment: Impaired  Impaired memory Pt drowsy throughout session, falling asleep at  times. Sensation Sensation Light Touch: Impaired Detail Light Touch Impaired Details: Impaired RLE;Impaired LLE(h/o neuropathy; in LLE absent sensation up to mid shin) Proprioception: Impaired Detail Proprioception Impaired Details: Impaired LLE Coordination Gross Motor Movements are Fluid and Coordinated: Yes Motor  Motor Motor: Other (comment) Motor - Skilled Clinical Observations: generalized weakness  Mobility Bed Mobility Bed Mobility: Sit to Supine;Supine to Sit Supine to Sit: 6: Modified independent (Device/Increase time) Sit to Supine: 6: Modified independent (Device/Increase time) Transfers Transfers: Yes Sit to Stand: 4: Min assist;With armrests Sit to Stand Details: Tactile cues for weight shifting;Verbal cues for technique;Verbal cues for precautions/safety Stand Pivot Transfers: 4: Min assist;With armrests;3: Mod assist(without AD) Stand Pivot Transfer Details: Verbal cues for safe use of DME/AE;Verbal cues for precautions/safety;Verbal cues for technique;Tactile cues for weight shifting Locomotion  Ambulation Ambulation: Yes Ambulation/Gait Assistance: 4: Min assist Ambulation Distance (Feet): 20 Feet Assistive device: Higher education careers adviser Mobility: Yes Wheelchair Assistance: 5: Careers information officer: Both upper extremities Wheelchair Parts Management: Needs assistance Distance: 150'  Trunk/Postural Assessment  Cervical Assessment Cervical Assessment: Within Functional Limits Thoracic Assessment Thoracic Assessment: (mild kyphotic posture) Lumbar Assessment Lumbar Assessment: (posterior pelvic tilt)  Balance Balance Balance Assessed: Yes Static Sitting Balance Static Sitting - Level of Assistance: 6: Modified independent (Device/Increase time) Dynamic Sitting Balance Dynamic Sitting - Level of Assistance: 5: Stand by assistance;4: Min assist Static Standing Balance Static Standing - Level of Assistance: 4: Min  assist Dynamic Standing Balance Dynamic Standing - Level of Assistance: 4: Min assist;3: Mod assist Extremity Assessment      RLE Assessment RLE Assessment: Exceptions to [facility] RLE Strength RLE Overall Strength Comments: R BKA; 3-/5 pain limiting LLE Assessment LLE Assessment: Within Functional Limits(decreased muscular endurance; grossly 4/5)   See Function Navigator for Current Functional Status.   Refer to Care Plan for Long Term Goals  Recommendations for other services: Neuropsych  Discharge Criteria: Patient will be discharged from PT if patient refuses treatment 3 consecutive times without medical reason, if treatment goals not met, if there is a change in medical status, if patient makes no progress towards goals or if patient is discharged from hospital.  The above assessment, treatment plan, treatment alternatives and goals were discussed and mutually agreed upon: by patient and by family  Juanna Cao, PT, DPT  04/17/2018, 11:54 AM

## 2018-04-17 NOTE — Telephone Encounter (Signed)
I spoke with Shanon Brow, pt's husband and informed of Dr. Leeanne Rio statement and orders. Shanon Brow states understanding and pt had not told the hospital staff about her left foot, so he did and they are putting her in the boot.

## 2018-04-17 NOTE — Evaluation (Signed)
Occupational Therapy Assessment and Plan  Patient Details  Name: [name] MRN: [id number] Date of Birth: 07-04-1966  OT Diagnosis: acute pain and muscle weakness (generalized) Rehab Potential: Rehab Potential (ACUTE ONLY): Good ELOS: ~7-10 days   Today's Date: 04/17/2018 OT Individual Time: 1300-1400 OT Individual Time Calculation (min): 60 min     Problem List:  Patient Active Problem List   Diagnosis Date Noted  . 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 PAD (peripheral artery disease) ([place])   . Poorly controlled type 2 diabetes mellitus ([place])   . Cutaneous abscess of right foot   . Subacute osteomyelitis, right ankle and foot ([place])   . Major depressive disorder, recurrent episode ([place]) 04/11/2018  . Diabetic ulcer of right midfoot associated with type 2 diabetes mellitus, with necrosis of bone ([place])   . Abscess of ankle   . Infectious synovitis   . Diabetic foot infection ([place])   . Severe sepsis ([place]) 04/09/2018  . Cancer of sigmoid colon ([place]) 03/09/2015  . OSA (obstructive sleep apnea) 07/04/2014  . Migraine without aura, with intractable migraine, so stated, without mention of status migrainosus 03/26/2014  . Peripheral neuropathy 03/03/2014  . Abnormal stress test 02/12/2014  . CVA (cerebral infarction) 02/12/2014  . Chest pain   . Abnormal heart rhythm   . Edema   . Hypertension   . Hyperlipemia   . Hypercholesterolemia   . Other and unspecified hyperlipidemia 11/14/2013  . Type II diabetes mellitus, uncontrolled ([place]) 07/22/2013  . Essential hypertension 07/22/2013  . 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 Diabetic polyneuropathy ([place]) 07/22/2013    Past Medical History:  Past Medical History:  Diagnosis Date  . Anxiety   . Arthritis   . Bipolar disorder ([place])   . Broken jaw ([place])   . Broken wrist   . Chronic back pain   . Claudication ([place])    a. 12/2013 ABI's: R 0.97, L 0.94.   [name] COPD (chronic obstructive pulmonary disease) ([place])   . Depression   . Diabetic Charcot's foot ([place])   . Diabetic foot ulcer (HCC)    chronic left foot ulcer , great toe/  hx recurrent foot ulcer bilaterally  . DJD (degenerative joint disease)   . Edema of both lower extremities   . Episode of memory loss   . GERD (gastroesophageal reflux disease)   . Headache(784.0)   . Hiatal hernia   . History of colon cancer, stage I dx 02-26-2015--- oncologist-- dr [name]--- per last note no recurrence   05-16-2015 s/p  Laparoscopic sigmoid colectomy w/ node bx's (negative nodes per path)  Stage I (pT1,N0,M0) Grade 2  . 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   per MRI multiple small strokes post cardiac cath. --  residual mild memory loss  . History of methicillin resistant staphylococcus aureus (MRSA)   . Hypercholesterolemia   . Insomnia   . Insulin dependent type 2 diabetes mellitus, uncontrolled ([place]) dx 2004   endocrinologist-  dr Dwyane Dee-- last A1c 8.8 in Aug2018:  pt is noncompliant w/ diet, states does note eat breakfast , her first main meal in afternoon  . Neurogenic bladder   . Neuropathy, diabetic ([place])    hands and feet  . OSA (obstructive sleep apnea)    cpap intolerant  . Personality disorder ([place])   . SOB (shortness of breath) on exertion   . Stroke ([place])   . SUI (stress urinary  incontinence, female) S/P [facility] 12-29-2011   Past Surgical History:  Past Surgical History:  Procedure Laterality Date  . ABDOMINAL HYSTERECTOMY    . AMPUTATION Right 04/13/2018   Procedure: RIGHT BELOW KNEE AMPUTATION;  Surgeon: Newt Minion, MD;  Location: [facility];  Service: Orthopedics;  Laterality: Right;  . ANTERIOR CERVICAL DECOMP/DISCECTOMY FUSION  2000   C5 - 7  . CARDIAC CATHETERIZATION  05-22-2008   DR SKAINS   NO SIGNIFECANT CAD/ NORMAL LV/  EF 65%/  NO WALL MOTION ABNORMALITIES  . CARPAL TUNNEL RELEASE Right 04-25-2013  . [place]; 1992  .  COLONOSCOPY    . CYSTO N/A 04/30/2013   Procedure: CYSTOSCOPY;  Surgeon: Reece Packer, MD;  Location: [facility];  Service: Urology;  Laterality: N/A;  . CYSTOSCOPY MACROPLASTIQUE IMPLANT N/A 02/06/2018   Procedure: CYSTOSCOPY MACROPLASTIQUE IMPLANT;  Surgeon: Bjorn Loser, MD;  Location: [facility];  Service: Urology;  Laterality: N/A;  . CYSTOSCOPY WITH INJECTION  05/04/2012   Procedure: CYSTOSCOPY WITH INJECTION;  Surgeon: Reece Packer, MD;  Location: [facility];  Service: Urology;  Laterality: N/A;  MACROPLASTIQUE INJECTION  . CYSTOSCOPY WITH INJECTION  08/28/2012   Procedure: CYSTOSCOPY WITH INJECTION;  Surgeon: Reece Packer, MD;  Location: [facility];  Service: Urology;  Laterality: N/A;  cysto and macroplastique   . FOOT SURGERY Bilateral   . HERNIA REPAIR  ?1996   "stomach"  . INCISION AND DRAINAGE OF WOUND Right 04/11/2018   Procedure: IRRIGATION AND DEBRIDEMENT WOUND right foot and right ankle;  Surgeon: Evelina Bucy, DPM;  Location: [facility];  Service: Podiatry;  Laterality: Right;  . KNEE ARTHROSCOPY W/ ALLOGRAFT IMPANT Left    graft x 2  . KNEE SURGERY     TOTAL 8 SURG'S  . LAPAROSCOPIC SIGMOID COLECTOMY N/A 04/22/2015   Procedure: LAPAROSCOPIC HAND ASSISTED SIGMOID COLECTOMY;  Surgeon: Erroll Luna, MD;  Location: [place] [facility];  Service: General;  Laterality: N/A;  . LEFT HEART CATHETERIZATION WITH CORONARY ANGIOGRAM N/A 02/12/2014   Procedure: LEFT HEART CATHETERIZATION WITH CORONARY ANGIOGRAM;  Surgeon: Candee Furbish, MD;  Location: [facility];  Service: Cardiovascular;  Laterality: N/A;   No angiographically significant CAD; normal LVSF, LVEDP 16mHg,  EF 55% (new finding ef 30% myoview 01-08-2014)  . LUMBAR FUSION    . MANDIBLE FRACTURE SURGERY    . MULTIPLE LAPAROSCOPIES FOR ENDOMETRIOSIS    . PUBOVAGINAL SLING  12/29/2011   Procedure: PGaynelle Arabian  Surgeon: SReece Packer MD;  [facility]  Service: Urology;  Laterality: N/A;  cysto and sparc sling   . PUBOVAGINAL SLING N/A 04/30/2013   Procedure: REMOVAL OF VAGINAL MESH;  Surgeon: SReece Packer MD;  [facility];  Service: Urology;  Laterality: N/A;  . RECONSTURCTION OF CONGENITAL UTERUS ANOMALY  1983  . REPEAT RECONSTRUCTION ACL LEFT KNEE/ SCREWS REMOVED  03-28-2000   CADAVER GRAFT  . TOTAL ABDOMINAL HYSTERECTOMY W/ BILATERAL SALPINGOOPHORECTOMY  1997  . TRANSTHORACIC ECHOCARDIOGRAM  02/13/2014   ef 45%, hypokinesis base inferior and base inferolateral walls  . UPPER GI ENDOSCOPY    . WOUND DEBRIDEMENT Right 09/05/2016   Procedure: DEBRIDEMENT WOUND WITH GRAFT RIGHT FOOT;  Surgeon: TLandis Martins DPM;  Location[place] [facility]  Service: Podiatry;  Laterality: Right;  . WRIST FRACTURE SURGERY      Assessment & Plan Clinical Impression: Patient is a 52y.o. year old female history of COPD with ongoing tobacco use  3 packs a day, obstructive sleep apnea, poorly controlled diabetes mellitus with insensate neuropathy and retinopathy, bilateral lower extremity edema, Charcot foot; who was admitted via ED on 04/09/2018 with weakness mental status changes and worsening of foot wound. She was found to have strep agalactiae sepsis and was started on IV antibiotics for treatment. MRI of the foot revealed early osteomyelitis with with abscess formation and she underwent I&D of right foot wound without improvement in healing. Dr. 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  Patient transferred to CIR on 04/16/2018 .    Patient currently requires min with basic self-care skills and functional mobility  secondary to muscle weakness and acute pain, decreased cardiorespiratoy endurance and decreased standing balance and difficulty maintaining precautions.  Prior to hospitalization, patient could complete ADL with independent  .  Patient will benefit from skilled intervention to decrease level of assist with basic self-care skills, increase independence with basic self-care skills and ADLs prior to discharge home with care partner.  Anticipate patient will require intermittent supervision and follow up home health.  OT - End of Session Activity Tolerance: Tolerates 30+ min activity without fatigue Endurance Deficit: Yes OT Assessment Rehab Potential (ACUTE ONLY): Good OT Patient demonstrates impairments in the following area(s): Balance;Edema;Endurance;Motor;Pain;Safety;Skin Integrity OT Basic ADL's Functional Problem(s): Grooming;Bathing;Dressing;Toileting OT Advanced ADL's Functional Problem(s): Simple Meal Preparation OT Transfers Functional Problem(s): Toilet;Tub/Shower OT Additional Impairment(s): None OT Plan OT Intensity: Minimum of 1-2 x/day, 45 to 90 minutes OT Frequency: 5 out of 7 days OT Duration/Estimated Length of Stay: ~7-10 days OT Treatment/Interventions: Balance/vestibular training;Disease mangement/prevention;Self Care/advanced ADL retraining;Therapeutic Exercise;Wheelchair propulsion/positioning;UE/LE Strength taining/ROM;Pain management;Community reintegration;Patient/family education;Splinting/orthotics;UE/LE Coordination activities;Discharge planning;Functional mobility training;Psychosocial support;Therapeutic Activities OT Self Feeding Anticipated Outcome(s): n/a OT Basic Self-Care Anticipated Outcome(s): mod I  OT Toileting Anticipated Outcome(s): mod I  OT Bathroom Transfers Anticipated Outcome(s): mod I OT Recommendation Patient destination: Home Follow Up Recommendations: Home health OT Equipment Recommended: To be determined   Skilled Therapeutic Intervention 1:1 Ot eval initiated with OT goals, purpose and role discussed. Self care retraining at sink level with focus on sit to stands, safe transfers in and out of w/c and standing balance with use of UEs. Pt requires cues for safe  transfer method as she dives into w/c in bed to w/c transfer. Discussed and education provided on protection of left foot and importance of wearing a shoe with all ambulation and transfers. LEft sitting at the sink brushing her teeth.    OT Evaluation Precautions/Restrictions  Precautions Precautions: Fall Precaution Comments: wound vac R; new R BKA Required Braces or Orthoses: Other Brace/Splint Other Brace/Splint: Limb guard for RLE Restrictions Weight Bearing Restrictions: Yes RLE Weight Bearing: Non weight bearing LLE Weight Bearing: Weight bearing as tolerated General Chart Reviewed: Yes Family/Caregiver Present: No Vital Signs  Pain Pain Assessment Pain Scale: 0-10 Pain Score: 6  Pain Type: Acute pain Pain Location: Leg Pain Orientation: Right Pain Descriptors / Indicators: Aching Pain Onset: Gradual Patients Stated Pain Goal: 2 Pain Intervention(s): Medication (See eMAR)(oxycodone 58m po) Home Living/Prior Functioning Home Living Family/patient expects to be discharged to:: Private residence Living Arrangements: Spouse/significant other Available Help at Discharge: Family Type of Home: House Home Access: Ramped entrance, Stairs to enter ETechnical brewerof Steps: 2 Entrance Stairs-Rails: None Home Layout: One level Bathroom Shower/Tub: TChiropodist Standard Bathroom Accessibility: No Additional Comments: husband already has ramp installed for home access; home measurement  sheet issued on eval  Lives With: Spouse Prior Function Level of Independence: Independent with gait, Independent with transfers, Independent with basic ADLs  Able to Take Stairs?: Yes ADL ADL ADL Comments: see functional navigator Vision Baseline Vision/History: No visual deficits Vision Assessment?: Yes Perception  Perception: Within Functional Limits Praxis Praxis: Intact Cognition Overall Cognitive Status: Within Functional Limits for tasks  assessed Arousal/Alertness: Awake/alert Orientation Level: Person;Place;Situation Person: Oriented Place: Oriented Situation: Oriented Year: 2019 Month: April Day of Week: Correct Memory: Appears intact Immediate Memory Recall: Sock;Blue;Bed Memory Recall: Sock;Blue(2/3) Memory Recall Sock: Without Cue Memory Recall Blue: Without Cue Attention: Sustained;Selective Sustained Attention: Appears intact Selective Attention: Appears intact Problem Solving: Impaired Safety/Judgment: Impaired Sensation Sensation Light Touch: Impaired Detail Light Touch Impaired Details: Impaired RLE;Impaired LLE Proprioception: Impaired Detail Proprioception Impaired Details: Impaired LLE Coordination Gross Motor Movements are Fluid and Coordinated: Yes Fine Motor Movements are Fluid and Coordinated: Yes Motor  Motor Motor: Other (comment) Motor - Skilled Clinical Observations: generalized weakness Mobility  Bed Mobility Bed Mobility: Sit to Supine;Supine to Sit Supine to Sit: 6: Modified independent (Device/Increase time) Sit to Supine: 6: Modified independent (Device/Increase time) Transfers Sit to Stand: 4: Min assist;With armrests Sit to Stand Details: Tactile cues for weight shifting;Verbal cues for technique;Verbal cues for precautions/safety  Trunk/Postural Assessment  Cervical Assessment Cervical Assessment: Within Functional Limits Thoracic Assessment Thoracic Assessment: (mild kyphotic ) Lumbar Assessment Lumbar Assessment: (posterior pelvic tilt) Postural Control Postural Control: Within Functional Limits  Balance Balance Balance Assessed: Yes Static Sitting Balance Static Sitting - Level of Assistance: 6: Modified independent (Device/Increase time) Dynamic Sitting Balance Dynamic Sitting - Level of Assistance: 5: Stand by assistance;4: Min assist Static Standing Balance Static Standing - Level of Assistance: 4: Min assist Dynamic Standing Balance Dynamic Standing -  Level of Assistance: 4: Min assist;3: Mod assist Extremity/Trunk Assessment RUE Assessment RUE Assessment: Within Functional Limits LUE Assessment LUE Assessment: Within Functional Limits   See Function Navigator for Current Functional Status.   Refer to Care Plan for Long Term Goals  Recommendations for other services: None    Discharge Criteria: Patient will be discharged from OT if patient refuses treatment 3 consecutive times without medical reason, if treatment goals not met, if there is a change in medical status, if patient makes no progress towards goals or if patient is discharged from hospital.  The above assessment, treatment plan, treatment alternatives and goals were discussed and mutually agreed upon: by patient  Nicoletta Ba 04/17/2018, 1:43 PM

## 2018-04-17 NOTE — Progress Notes (Signed)
[facility] PHYSICAL MEDICINE & REHABILITATION     PROGRESS NOTE    Subjective/Complaints: Pain an issues. Around a 7 this morning. Unhappy that she's on a carb mod diet  ROS: Patient denies fever, rash, sore throat, blurred vision, nausea, vomiting, diarrhea, cough, shortness of breath or chest pain,   back pain, headache, or mood change.   Objective: Vital Signs: Blood pressure (!) 151/54, pulse 81, temperature 98.4 F (36.9 C), temperature source Oral, resp. rate 15, height 5\' 5"  (1.651 m), weight 96 kg (211 lb 10.3 oz), SpO2 96 %. No results found. Recent Labs    04/17/18 0522  WBC 13.6*  HGB 10.4*  HCT 32.7*  PLT 391   Recent Labs    04/16/18 0514 04/17/18 0522  NA  --  138  K  --  3.7  CL  --  103  GLUCOSE  --  121*  BUN  --  5*  CREATININE 0.51 0.57  CALCIUM  --  8.2*   CBG (last 3)  Recent Labs    04/16/18 1142 04/16/18 2116 04/17/18 0700  GLUCAP 178* 198* 117*    Wt Readings from Last 3 Encounters:  04/16/18 96 kg (211 lb 10.3 oz)  04/13/18 96 kg (211 lb 10.3 oz)  03/19/18 90.3 kg (199 lb)    Physical Exam:  Constitutional: No distress . Vital signs reviewed. HEENT: EOMI, oral membranes moist Cardiovascular: RRR without murmur. No JVD    Respiratory: CTA Bilaterally without wheezes or rales. Normal effort    GI: BS +, non-tender, non-distended    Musculoskeletal: R-BKA covered with limb guard over Prevana VAC.  Neurological: She isalertand oriented to person, place, and time.  Skin: Skin iswarmand dry. Callus on left first MTP, old wound, small area of bruising between 3rd/4th toes. Psychiatric: She has anormal mood and affect. Herbehavior is normal.Thought contentnormal.     Assessment/Plan: 1. Functional deficits secondary to right BKA which require 3+ hours per day of interdisciplinary therapy in a comprehensive inpatient rehab setting. Physiatrist is providing close team supervision and 24 hour management of active medical  problems listed below. Physiatrist and rehab team continue to assess barriers to discharge/monitor patient progress toward functional and medical goals.  Function:  Bathing Bathing position      Bathing parts      Bathing assist        Upper Body Dressing/Undressing Upper body dressing                    Upper body assist        Lower Body Dressing/Undressing Lower body dressing                                  Lower body assist        Toileting Toileting          Toileting assist     Transfers Chair/bed transfer             Locomotion Ambulation           Wheelchair          Cognition Comprehension    Expression    Social Interaction    Problem Solving    Memory     Medical Problem List and Plan: 1.Functional deficitssecondary to right BKA -admit to inpatient rehab 2. DVT Prophylaxis/Anticoagulation: Pharmaceutical:Lovenox 3. Pain Management:oxycodone prn. Educate patient on desensitization. Titrate gabapentin upwards as tolerated  and needed. -limb guard for stump protection 4. Mood:LCSW to follow for evaluation and support. 5. Neuropsych: This patientiscapable of making decisions on herown behalf. 6. Skin/Wound Care:Monitor wound for healing. Routine pressure relief measures. 7. Fluids/Electrolytes/Nutrition:Monitor I/O.   -I personally reviewed the patient's labs today.    8.T2DM: Monitor BS ac/hs. Continue Lantus 24u daily. with SSI. Resumed meal coverage at 5 units tid ac.    -sugars under reasonable control   -pt wants to go on regular diet ("I'm allergic to artificial sweetners")    -will ask RD to help provide further education    -reviewed need for tight CBG control with pt 9. Step agalactiae sepsis: TH recommends changing IV ampicillin to amoxicillin for 14 day total antibiotic regimen.  10. Bipolar disorder/Personality disorder: On Seroquel at bedtime. 11. Leukocytosis:  likely reactive---recheck later this week   LOS (Days) [place] EVALUATION WAS PERFORMED  Meredith Staggers, MD 04/17/2018 8:30 AM

## 2018-04-17 NOTE — Progress Notes (Signed)
Patient information reviewed and entered into eRehab system by Allure Greaser, RN, CRRN, PPS Coordinator.  Information including medical coding and functional independence measure will be reviewed and updated through discharge.     Per nursing patient was given "Data Collection Information Summary for Patients in Inpatient Rehabilitation Facilities with attached "Privacy Act Statement-Health Care Records" upon admission.  

## 2018-04-17 NOTE — Progress Notes (Signed)
Occupational Therapy Session Note  Patient Details  Name: [name] MRN: [id number] Date of Birth: [birth year]  Today's Date: 04/17/2018 OT Individual Time: 1447-1535 OT Individual Time Calculation (min): 48 min    Short Term Goals: Week 1:  OT Short Term Goal 1 (Week 1): LTg=STG  Skilled Therapeutic Interventions/Progress Updates:  Pt received sitting up in w/c agreeable to therapy. Education provided re desensitization technique to reduce phantom limb pain, including tactile input and visual attention/awareness. Pt completed 233f of self-propulsion in w/c in order to increase B UE strength/endurance necessary to propel w/c community distances and to increase B UE strength needed during ADL transfers. Extensive discussion, encouragement, and emotional support provided d/t pt expressing concern and feelings of sadness re BKA and its effects on participation on ADL/IADLs. With pt permission, another pt in the unit with previous BKA was asked to provide guidance and advice during passing in hallway and the two spoke for several minutes. Pt found this very helpful and became emotional following the conversation. Pt returned to room and performed stand pivot transfer to bed with min A and vc for technique provided. Bed alarm set and all needs met.   Therapy Documentation Precautions:  Precautions Precautions: Fall Precaution Comments: wound vac R; new R BKA Required Braces or Orthoses: Other Brace/Splint Other Brace/Splint: Limb guard for RLE Restrictions Weight Bearing Restrictions: Yes RLE Weight Bearing: Non weight bearing LLE Weight Bearing: Weight bearing as tolerated General: General Chart Reviewed: Yes Family/Caregiver Present: No Vital Signs: Therapy Vitals Temp: 98.3 F (36.8 C) Temp Source: Oral Pulse Rate: 75 Resp: 18 BP: (!) 102/57 Patient Position (if appropriate): Sitting Oxygen Therapy SpO2: 92 % O2 Device: Room Air Pain: Pain Assessment Pain Scale:  0-10 Pain Score: 0-No pain Pain Type: Acute pain Pain Location: Leg Pain Orientation: Right Pain Descriptors / Indicators: Aching Pain Onset: Gradual Patients Stated Pain Goal: 2 Pain Intervention(s): Medication (See eMAR)(oxycodone 180mpo) ADL: ADL ADL Comments: see functional navigator Vision Baseline Vision/History: No visual deficits Vision Assessment?: Yes Perception  Perception: Within Functional Limits Praxis Praxis: Intact  See Function Navigator for Current Functional Status.   Therapy/Group: Individual Therapy  SaCurtis Sites/23/2019, 4:05 PM

## 2018-04-17 NOTE — Plan of Care (Signed)
  RD consulted for nutrition education regarding diabetes.   Lab Results  Component Value Date   HGBA1C 9.2 03/19/2018    RD provided "Carbohydrate Counting for People with Diabetes" handout from the Academy of Nutrition and Dietetics. Discussed different food groups and their effects on blood sugar, emphasizing carbohydrate-containing foods. Provided list of carbohydrates and recommended serving sizes of common foods. Pt reports allergy to artificial sugar with associated reaction of headache and nausea. Pt reports usually consume a whole 2 liter bottle of regular soda daily. RD and RN discussed consequences of uncontrolled diabetes on the body. Pt unwilling to decrease or limit amount of soda consumption. Pt unhappy as she is on a carbohydrate modified diet and request a regular diet to allow kitchen to send up regular soda on her tray. Pt unwilling to change diet eating habits currently and upon discharge home.   Discussed importance of controlled and consistent carbohydrate intake throughout the day. Provided examples of ways to balance meals/snacks and encouraged intake of high-fiber, whole grain complex carbohydrates. Teach back method used.  Expect poor compliance.  Body mass index is 35.22 kg/m. Pt meets criteria for class I obesity based on current BMI.  Current diet order is Carbohydrate modified, patient is consuming approximately 100% of meals at this time. Labs and medications reviewed. No further nutrition interventions warranted at this time. RD contact information provided. If additional nutrition issues arise, please re-consult RD.  Corrin Parker, MS, RD, LDN Pager # [phone] After hours/ weekend pager # [phone]

## 2018-04-17 NOTE — Progress Notes (Signed)
Patient non responsive to diabetes education. Reviewed in depth noncompliance with diabetes management can result in increased health issues. Patient reports she will drink more water but will not drink diet soda. Continue to educate and encourage patient to maintain limited intake of sugar. Continue with plan of care.  Mliss Sax

## 2018-04-17 NOTE — Telephone Encounter (Signed)
Patient wants to know if Dr Cannon Kettle knows her leg was amputated? And when does she need to be seen again. She's currently in [facility]. If you can call her back at [phone].

## 2018-04-17 NOTE — Telephone Encounter (Signed)
Ok thank you 

## 2018-04-17 NOTE — Telephone Encounter (Signed)
Val Please let patient know that I am aware and that we will see her back after she has gotten out of rehab and after her amputation site on the right has healed just to check the left foot. Thanks Dr. Chauncey Cruel

## 2018-04-18 ENCOUNTER — Inpatient Hospital Stay (HOSPITAL_COMMUNITY): Payer: Medicare Other | Admitting: Occupational Therapy

## 2018-04-18 ENCOUNTER — Inpatient Hospital Stay (HOSPITAL_COMMUNITY): Payer: Self-pay | Admitting: *Deleted

## 2018-04-18 ENCOUNTER — Inpatient Hospital Stay (HOSPITAL_COMMUNITY): Payer: Self-pay | Admitting: Physical Therapy

## 2018-04-18 ENCOUNTER — Inpatient Hospital Stay (HOSPITAL_COMMUNITY): Payer: Medicare Other

## 2018-04-18 LAB — GLUCOSE, CAPILLARY
GLUCOSE-CAPILLARY: 194 mg/dL — AB (ref 65–99)
GLUCOSE-CAPILLARY: 165 mg/dL — AB (ref 65–99)
Glucose-Capillary: 112 mg/dL — ABNORMAL HIGH (ref 65–99)
Glucose-Capillary: 144 mg/dL — ABNORMAL HIGH (ref 65–99)

## 2018-04-18 NOTE — Patient Care Conference (Signed)
Inpatient RehabilitationTeam Conference and Plan of Care Update Date: 04/17/2018   Time: 2:45 PM    Patient Name: [name]      Medical Record Number: [id number]  Date of Birth: [birth year] Sex: Female         Room/Bed: 4W01C/4W01C-01 Payor Info: Payor: MEDICARE / Plan: MEDICARE PART A AND B / Product Type: *No Product type* /    Admitting Diagnosis: r bka  Admit Date/Time:  04/16/2018  6:39 PM Admission Comments: No comment available   Primary Diagnosis:  <principal problem not specified> Principal Problem: <principal problem not specified>  Patient Active Problem List   Diagnosis Date Noted  . 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 PAD (peripheral artery disease) ([place])   . Poorly controlled type 2 diabetes mellitus ([place])   . Cutaneous abscess of right foot   . Subacute osteomyelitis, right ankle and foot ([place])   . Major depressive disorder, recurrent episode ([place]) 04/11/2018  . Diabetic ulcer of right midfoot associated with type 2 diabetes mellitus, with necrosis of bone ([place])   . Abscess of ankle   . Infectious synovitis   . Diabetic foot infection ([place])   . Severe sepsis ([place]) 04/09/2018  . Cancer of sigmoid colon ([place]) 03/09/2015  . OSA (obstructive sleep apnea) 07/04/2014  . Migraine without aura, with intractable migraine, so stated, without mention of status migrainosus 03/26/2014  . Peripheral neuropathy 03/03/2014  . Abnormal stress test 02/12/2014  . CVA (cerebral infarction) 02/12/2014  . Chest pain   . Abnormal heart rhythm   . Edema   . Hypertension   . Hyperlipemia   . Hypercholesterolemia   . Other and unspecified hyperlipidemia 11/14/2013  . Type II diabetes mellitus, uncontrolled ([place]) 07/22/2013  . Essential hypertension 07/22/2013  . 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 Diabetic polyneuropathy ([place]) 07/22/2013    Expected Discharge Date: Expected Discharge Date:  04/26/18  Team Members Present: Physician leading conference: Dr. Alger Simons Social Worker Present: Lennart Pall, LCSW Nurse Present: Junius Creamer, RN PT Present: Roderic Ovens, PT OT Present: Benay Pillow, OT SLP Present: Weston Anna, SLP PPS Coordinator present : Daiva Nakayama, RN, CRRN     Current Status/Progress Goal Weekly Team Focus  Medical   right bka, bipolar, diabetes with poor compliance. pain control issues  improve functional mobility  wound care/vac, diabetes ed, pain mgt   Bowel/Bladder   continent of b/b, LBM 4/22. voiding large volumes 500-767ml q2-3 hours.  maintain b/b with mod I assist  montitor b/b status q shift and prn, toilet q 3 hours while awake   Swallow/Nutrition/ Hydration             ADL's   Min A overall  mod I wheelchair level and supervision for shower transfer  self care retraining, functional transfers, balance, pt/family edu   Mobility   min A  supervision/mod I w/c level  strength, balance, gait   Communication             Safety/Cognition/ Behavioral Observations            Pain   c/o severe pain to R BKA, oxy 15mg  q 4 hours prn, pt requests med q 2-3 hours,  prn tylenol and robaxin  maintain pain <4  monitor and treat pain as needed   Skin   wound vac to right stump, scant drainage, perineal/anal area with rash treated with nystatin, pt states it is much better than on  admission to hospital, left foot with previous diabetic ulcer, by great toe on plantar surface and also cracking or diabetic ulcer noted to area on left foot between 3rd and 4th toe.  maintain skin with min assist  monitor and treat skin q shift and prn as ordered    Rehab Goals Patient on target to meet rehab goals: Yes *See Care Plan and progress notes for long and short-term goals.     Barriers to Discharge  Current Status/Progress Possible Resolutions Date Resolved   Physician    Medical stability;Wound Care        continued medical mgt as per chart       Nursing  IV antibiotics;Wound Care               PT                    OT                  SLP                SW                Discharge Planning/Teaching Needs:  Plan home with s/o providing any needed assistance.  TBD   Team Discussion:  New eval;  BKA, h/o bipolar;  DM but non-compliant with diet.  Voiding lg amounts of urine and refuses scans - MD aware.  Anticipate she may be non-compliant ongoing after d/c.  Min to supervision on eval with mod ind w/c level goals.  Revisions to Treatment Plan:  None    Continued Need for Acute Rehabilitation Level of Care: The patient requires daily medical management by a physician with specialized training in physical medicine and rehabilitation for the following conditions: Daily direction of a multidisciplinary physical rehabilitation program to ensure safe treatment while eliciting the highest outcome that is of practical value to the patient.: Yes Daily medical management of patient stability for increased activity during participation in an intensive rehabilitation regime.: Yes Daily analysis of laboratory values and/or radiology reports with any subsequent need for medication adjustment of medical intervention for : Post surgical problems;Wound care problems  Hyland Mollenkopf 04/18/2018, 2:35 PM

## 2018-04-18 NOTE — Progress Notes (Addendum)
Physical Therapy Note  Patient Details  Name: [name] MRN: [id number] Date of Birth: 1966/07/15 Today's Date: 04/18/2018  tx 1:  1000-1030, 30 min individual tx Pain: 6/10 residual limb, premedicated  Pt very negative regarding residual limb, chances of walking again, etc.  PT provided emotional support.  PT will follow up with CSW regarding Neuro psych consult.  Pt also stated that she had phantom sensation at times, but declined attempting desensitization.  Therapeutic exercise performed with LE to increase strength for functional mobility: in L/R sidelying- 1 x 10 each RL hip extension with flexed knee for glut isolation, clam shells for hip abductor activation; supine, L unilateral bridging, 2 x 10 modified abdominal crunches.  Pt left resting in bed with needs at hand and alarm set.  tx 2:  1510-1610, 60 min individual tx Pain: 6/10 R residual limb and phantom pain  W/c propulsion x 150' , x 100[' using bil UEs with supervision.  Strengthening exercises for LLE unilaterally on Kinetron in sitting in w/c, x 20 cycles x 2 with resistance at 40 cm/sec plus resistance from PT. Seated R quads sets, L heel raises, L long arc quad knee ext with 3# ankle wt, bil glut sets, bil hip abduction using Theraband, 2  x 10  Gait training with RW x 4' with min/mod assist.  Sit>< stand with bil hands on RW; pt unsuccessful with L hand on RW, R hand on armrest, and insisted on pulling on RW.  PT educated pt about safety risk.  PT wrapped yellow egg crate foam around R amputee pad for comfort and positioning in knee extension.  Pt's husband present in room upon return.  He stated that they own a sedan and pt will be able to use that at d/c instead of truck with high seat.  Pt chose to stay up in w/c.   Discussed falls risk with pt, use of call bell.  PT donned quick release belt, set alarm.  Husband stated that pt transferred from w/c> bed by herself yesterday.  See function navigator for  current status.  Marlow Hendrie 04/18/2018, 8:01 AM

## 2018-04-18 NOTE — Progress Notes (Signed)
Social Work Patient ID: [name], female   DOB: April 20, 1966, 52 y.o.   MRN: [id number]    Rexene Alberts  Social Worker  General Practice  Patient Care Conference  Signed  Date of Service:  04/18/2018  2:35 PM          Signed          Show:Clear all [x] Manual[x] Template[] Copied  Added by: [x] Lowella Curb, LCSW   [] Hover for details   Inpatient RehabilitationTeam Conference and Plan of Care Update Date: 04/17/2018   Time: 2:45 PM      Patient Name: [name]      Medical Record Number: [id number]  Date of Birth: 1966/08/26 Sex: Female         Room/Bed: 4W01C/4W01C-01 Payor Info: Payor: MEDICARE / Plan: MEDICARE PART A AND B / Product Type: *No Product type* /     Admitting Diagnosis: r bka  Admit Date/Time:  04/16/2018  6:39 PM Admission Comments: No comment available    Primary Diagnosis:  <principal problem not specified> Principal Problem: <principal problem not specified>       Patient Active Problem List    Diagnosis Date Noted  . Unilateral complete BKA ([place]) 04/16/2018  . PAD (peripheral artery disease) ([place])    . Poorly controlled type 2 diabetes mellitus ([place])    . Cutaneous abscess of right foot    . Subacute osteomyelitis, right ankle and foot ([place])    . Major depressive disorder, recurrent episode ([place]) 04/11/2018  . Diabetic ulcer of right midfoot associated with type 2 diabetes mellitus, with necrosis of bone ([place])    . Abscess of ankle    . Infectious synovitis    . Diabetic foot infection ([place])    . Severe sepsis ([place]) 04/09/2018  . Cancer of sigmoid colon ([place]) 03/09/2015  . OSA (obstructive sleep apnea) 07/04/2014  . Migraine without aura, with intractable migraine, so stated, without mention of status migrainosus 03/26/2014  . Peripheral neuropathy 03/03/2014  . Abnormal stress test 02/12/2014  . CVA (cerebral infarction) 02/12/2014  . Chest pain    . Abnormal heart rhythm    . Edema    . Hypertension    . Hyperlipemia     . Hypercholesterolemia    . Other and unspecified hyperlipidemia 11/14/2013  . Type II diabetes mellitus, uncontrolled ([place]) 07/22/2013  . Essential hypertension 07/22/2013  . 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 Diabetic polyneuropathy ([place]) 07/22/2013      Expected Discharge Date: Expected Discharge Date: 04/26/18   Team Members Present: Physician leading conference: Dr. Alger Simons Social Worker Present: Lennart Pall, LCSW Nurse Present: Junius Creamer, RN PT Present: Roderic Ovens, PT OT Present: Benay Pillow, OT SLP Present: Weston Anna, SLP PPS Coordinator present : Daiva Nakayama, RN, CRRN       Current Status/Progress Goal Weekly Team Focus  Medical     right bka, bipolar, diabetes with poor compliance. pain control issues  improve functional mobility  wound care/vac, diabetes ed, pain mgt   Bowel/Bladder     continent of b/b, LBM 4/22. voiding large volumes 500-752ml q2-3 hours.  maintain b/b with mod I assist  montitor b/b status q shift and prn, toilet q 3 hours while awake   Swallow/Nutrition/ Hydration               ADL's     Min A overall  mod I wheelchair level and supervision for shower  transfer  self care retraining, functional transfers, balance, pt/family edu   Mobility     min A  supervision/mod I w/c level  strength, balance, gait   Communication               Safety/Cognition/ Behavioral Observations             Pain     c/o severe pain to R BKA, oxy 15mg  q 4 hours prn, pt requests med q 2-3 hours,  prn tylenol and robaxin  maintain pain <4  monitor and treat pain as needed   Skin     wound vac to right stump, scant drainage, perineal/anal area with rash treated with nystatin, pt states it is much better than on admission to hospital, left foot with previous diabetic ulcer, by great toe on plantar surface and also cracking or diabetic ulcer noted to area on left foot between  3rd and 4th toe.  maintain skin with min assist  monitor and treat skin q shift and prn as ordered     Rehab Goals Patient on target to meet rehab goals: Yes *See Care Plan and progress notes for long and short-term goals.      Barriers to Discharge   Current Status/Progress Possible Resolutions Date Resolved   Physician     Medical stability;Wound Care        continued medical mgt as per chart      Nursing   IV antibiotics;Wound Care             PT                    OT                 SLP            SW              Discharge Planning/Teaching Needs:  Plan home with s/o providing any needed assistance.  TBD   Team Discussion:  New eval;  BKA, h/o bipolar;  DM but non-compliant with diet.  Voiding lg amounts of urine and refuses scans - MD aware.  Anticipate she may be non-compliant ongoing after d/c.  Min to supervision on eval with mod ind w/c level goals.  Revisions to Treatment Plan:  None    Continued Need for Acute Rehabilitation Level of Care: The patient requires daily medical management by a physician with specialized training in physical medicine and rehabilitation for the following conditions: Daily direction of a multidisciplinary physical rehabilitation program to ensure safe treatment while eliciting the highest outcome that is of practical value to the patient.: Yes Daily medical management of patient stability for increased activity during participation in an intensive rehabilitation regime.: Yes Daily analysis of laboratory values and/or radiology reports with any subsequent need for medication adjustment of medical intervention for : Post surgical problems;Wound care problems   Pete Merten 04/18/2018, 2:35 PM

## 2018-04-18 NOTE — Progress Notes (Signed)
Physical Therapy Session Note  Patient Details  Name: [name] MRN: [id number] Date of Birth: 1966-06-29  Today's Date: 04/18/2018 PT Individual Time: 0730-0800 PT Individual Time Calculation (min): 30 min   Short Term Goals: Week 1:  PT Short Term Goal 1 (Week 1): = LTGs due to ELOS  Skilled Therapeutic Interventions/Progress Updates:    Pt sidelying in bed finishing breakfast, agreeable to PT. Pt reports 5/10 pain in R residual limb. Education with patient while she finishes breakfast about phantom limb pain treatment techniques, the importance of touching residual limb for desensitization, etc. Pt reports she does not want to touch her residual limb or even think about, recommended amputee support group and will follow up with RT with regards to information about support group. Supine BLE therex: SLR, quad sets, hip abd, knee flexion x 10 reps each. Supine to/from sit Mod I. Sitting BLE therex: marches, LAQ. Pt left seated EOB with needs in reach, bed alarm in place.  Therapy Documentation Precautions:  Precautions Precautions: Fall Precaution Comments: wound vac R; new R BKA Required Braces or Orthoses: Other Brace/Splint Other Brace/Splint: Limb guard for RLE Restrictions Weight Bearing Restrictions: Yes RLE Weight Bearing: Non weight bearing LLE Weight Bearing: Weight bearing as tolerated  See Function Navigator for Current Functional Status.   Therapy/Group: Individual Therapy   Excell Seltzer, PT, DPT  04/18/2018, 12:06 PM

## 2018-04-18 NOTE — Progress Notes (Signed)
Occupational Therapy Session Note  Patient Details  Name: [name] MRN: [id number] Date of Birth: Mar 20, 1966  Today's Date: 04/18/2018 OT Individual Time: 1300-1413 OT Individual Time Calculation (min): 73 min    Short Term Goals: Week 1:  OT Short Term Goal 1 (Week 1): LTg=STG  Skilled Therapeutic Interventions/Progress Updates:    Upon entering the room, pt supine in bed awaiting therapist. Pt with c/o pain in residual limb and RN arrived with pain medication. Pt declined bathing and dressing this session. Pt performed squat pivot transfer with steady assistance. Pt required min verbal cues for safety awareness to lock wheelchair before transferring. Pt donning amputee pad and leg rest with increased time and encouragement. Pt propelling wheelchair 200' towards outside entrance. Pt able to propel on outside surface but unable to push chair up any type of gradual slope at this time. Pt returning to room at end of session and remaining in wheelchair with chair alarm activated. Call bell and all needed items within reach.   Therapy Documentation Precautions:  Precautions Precautions: Fall Precaution Comments: wound vac R; new R BKA Required Braces or Orthoses: Other Brace/Splint Other Brace/Splint: Limb guard for RLE Restrictions Weight Bearing Restrictions: Yes RLE Weight Bearing: Non weight bearing LLE Weight Bearing: Weight bearing as tolerated Vital Signs: Therapy Vitals Temp: 99.3 F (37.4 C) Temp Source: Oral Pulse Rate: 81 Resp: 16 BP: (!) 128/58 Patient Position (if appropriate): Sitting Oxygen Therapy SpO2: 95 % O2 Device: Room Air ADL: ADL ADL Comments: see functional navigator  See Function Navigator for Current Functional Status.   Therapy/Group: Individual Therapy  [name] 04/18/2018, 3:14 PM

## 2018-04-18 NOTE — Progress Notes (Signed)
[facility] PHYSICAL MEDICINE & REHABILITATION     PROGRESS NOTE    Subjective/Complaints: No new issues this morning. Pain under fair control.   ROS: Patient denies fever, rash, sore throat, blurred vision, nausea, vomiting, diarrhea, cough, shortness of breath or chest pain, joint or back pain, headache, or mood change.   Objective: Vital Signs: Blood pressure 109/77, pulse (!) 102, temperature 98.2 F (36.8 C), temperature source Oral, resp. rate 16, height 5\' 5"  (1.651 m), weight 95.3 kg (210 lb), SpO2 93 %. No results found. Recent Labs    04/17/18 0522  WBC 13.6*  HGB 10.4*  HCT 32.7*  PLT 391   Recent Labs    04/16/18 0514 04/17/18 0522  NA  --  138  K  --  3.7  CL  --  103  GLUCOSE  --  121*  BUN  --  5*  CREATININE 0.51 0.57  CALCIUM  --  8.2*   CBG (last 3)  Recent Labs    04/17/18 1645 04/17/18 2101 04/18/18 0632  GLUCAP 135* 164* 112*    Wt Readings from Last 3 Encounters:  04/18/18 95.3 kg (210 lb)  04/13/18 96 kg (211 lb 10.3 oz)  03/19/18 90.3 kg (199 lb)    Physical Exam:  Constitutional: No distress . Vital signs reviewed. HEENT: EOMI, oral membranes moist Cardiovascular: RRR without murmur. No JVD    Respiratory: CTA Bilaterally without wheezes or rales. Normal effort    GI: BS +, non-tender, non-distended   Musculoskeletal: R-BKAwithPrevana VAC.  Neurological: She isalertand oriented to person, place, and time.  Skin: Skin iswarmand dry. Callus on left first MTP, old wound, small area of bruising between 3rd/4th toes. Psychiatric: She has anormal mood and affect. Herbehavior is normal.Thought contentnormal.     Assessment/Plan: 1. Functional deficits secondary to right BKA which require 3+ hours per day of interdisciplinary therapy in a comprehensive inpatient rehab setting. Physiatrist is providing close team supervision and 24 hour management of active medical problems listed below. Physiatrist and rehab team continue  to assess barriers to discharge/monitor patient progress toward functional and medical goals.  Function:  Bathing Bathing position   Position: Wheelchair/chair at sink  Bathing parts Body parts bathed by patient: Right arm, Left arm, Chest, Abdomen, Front perineal area, Buttocks, Right upper leg, Left upper leg, Left lower leg Body parts bathed by helper: Back  Bathing assist Assist Level: Touching or steadying assistance(Pt > 75%)      Upper Body Dressing/Undressing Upper body dressing   What is the patient wearing?: Pull over shirt/dress     Pull over shirt/dress - Perfomed by patient: Thread/unthread right sleeve, Thread/unthread left sleeve, Put head through opening, Pull shirt over trunk          Upper body assist Assist Level: Touching or steadying assistance(Pt > 75%)      Lower Body Dressing/Undressing Lower body dressing   What is the patient wearing?: Socks, Shoes             Socks - Performed by patient: Don/doff left sock   Shoes - Performed by patient: Don/doff left shoe, Fasten left            Lower body assist Assist for lower body dressing: Supervision or verbal cues      Toileting Toileting   Toileting steps completed by patient: Adjust clothing prior to toileting, Performs perineal hygiene, Adjust clothing after toileting      Toileting assist Assist level: Touching or steadying assistance (Pt.75%)  Transfers Chair/bed transfer   Chair/bed transfer method: Stand pivot Chair/bed transfer assist level: Touching or steadying assistance (Pt > 75%)       Locomotion Ambulation     Max distance: 20' Assist level: Touching or steadying assistance (Pt > 75%)   Wheelchair   Type: Manual Max wheelchair distance: 200' Assist Level: Supervision or verbal cues  Cognition Comprehension Comprehension assist level: Understands complex 90% of the time/cues 10% of the time  Expression Expression assist level: Expresses complex 90% of the  time/cues < 10% of the time  Social Interaction Social Interaction assist level: Interacts appropriately 90% of the time - Needs monitoring or encouragement for participation or interaction.  Problem Solving Problem solving assist level: Solves basic 90% of the time/requires cueing < 10% of the time  Memory Memory assist level: Recognizes or recalls 90% of the time/requires cueing < 10% of the time   Medical Problem List and Plan: 1.Functional deficitssecondary to right BKA -continue therapies 2. DVT Prophylaxis/Anticoagulation: Pharmaceutical:Lovenox 3. Pain Management:oxycodone prn. Educate patient on desensitization. Titrate gabapentin upwards as tolerated and needed. -limb guard for stump protection 4. Mood:LCSW to follow for evaluation and support. 5. Neuropsych: This patientiscapable of making decisions on herown behalf. 6. Skin/Wound Care:Monitor wound for healing. Routine pressure relief measures. 7. Fluids/Electrolytes/Nutrition:Monitor I/O.   - .    8.T2DM: Monitor BS ac/hs. Continue Lantus 24u daily. with SSI. Resumed meal coverage at 5 units tid ac.    -sugars under reasonable control despite non-compliance   -poor awareness re: diet, non-compliant  -continue CM diet9. Step agalactiae sepsis: TH recommends changing IV ampicillin to amoxicillin for 14 day total antibiotic regimen.  10. Bipolar disorder/Personality disorder: On Seroquel at bedtime. 11. Leukocytosis: likely reactive---recheck later this week   LOS (Days) 2 [place]  Meredith Staggers, MD 04/18/2018 8:37 AM

## 2018-04-19 ENCOUNTER — Inpatient Hospital Stay (HOSPITAL_COMMUNITY): Payer: Medicare Other | Admitting: Occupational Therapy

## 2018-04-19 ENCOUNTER — Inpatient Hospital Stay (HOSPITAL_COMMUNITY): Payer: Self-pay

## 2018-04-19 ENCOUNTER — Inpatient Hospital Stay (HOSPITAL_COMMUNITY): Payer: Self-pay | Admitting: Physical Therapy

## 2018-04-19 DIAGNOSIS — F4310 Post-traumatic stress disorder, unspecified: Secondary | ICD-10-CM

## 2018-04-19 DIAGNOSIS — R7881 Bacteremia: Secondary | ICD-10-CM

## 2018-04-19 LAB — GLUCOSE, CAPILLARY
GLUCOSE-CAPILLARY: 105 mg/dL — AB (ref 65–99)
GLUCOSE-CAPILLARY: 177 mg/dL — AB (ref 65–99)
Glucose-Capillary: 158 mg/dL — ABNORMAL HIGH (ref 65–99)
Glucose-Capillary: 252 mg/dL — ABNORMAL HIGH (ref 65–99)

## 2018-04-19 MED ORDER — ALPRAZOLAM 0.25 MG PO TABS
0.2500 mg | ORAL_TABLET | Freq: Two times a day (BID) | ORAL | Status: DC | PRN
  Administered 2018-04-19 (×2): 0.25 mg via ORAL
  Filled 2018-04-19 (×2): qty 1

## 2018-04-19 MED ORDER — AMOXICILLIN 500 MG PO CAPS
1000.0000 mg | ORAL_CAPSULE | Freq: Three times a day (TID) | ORAL | Status: AC
  Administered 2018-04-19 – 2018-04-22 (×9): 1000 mg via ORAL
  Filled 2018-04-19: qty 2
  Filled 2018-04-19 (×2): qty 4
  Filled 2018-04-19 (×2): qty 2
  Filled 2018-04-19 (×4): qty 4
  Filled 2018-04-19 (×2): qty 2
  Filled 2018-04-19 (×2): qty 4

## 2018-04-19 MED ORDER — NORTRIPTYLINE HCL 25 MG PO CAPS
25.0000 mg | ORAL_CAPSULE | Freq: Every day | ORAL | Status: DC
  Administered 2018-04-19 – 2018-04-25 (×7): 25 mg via ORAL
  Filled 2018-04-19 (×7): qty 1

## 2018-04-19 NOTE — Evaluation (Signed)
Recreational Therapy Assessment and Plan  Patient Details  Name: [name] MRN: [id number] Date of Birth: 1966-05-31 Today's Date: 04/19/2018  Rehab Potential:  Good ELOS:   discharge 5/2  Assessment Problem List:      Patient Active Problem List   Diagnosis Date Noted  . Unilateral complete BKA ([place]) 04/16/2018  . PAD (peripheral artery disease) ([place])   . Poorly controlled type 2 diabetes mellitus ([place])   . Cutaneous abscess of right foot   . Subacute osteomyelitis, right ankle and foot ([place])   . Major depressive disorder, recurrent episode ([place]) 04/11/2018  . Diabetic ulcer of right midfoot associated with type 2 diabetes mellitus, with necrosis of bone ([place])   . Abscess of ankle   . Infectious synovitis   . Diabetic foot infection ([place])   . Severe sepsis ([place]) 04/09/2018  . Cancer of sigmoid colon ([place]) 03/09/2015  . OSA (obstructive sleep apnea) 07/04/2014  . Migraine without aura, with intractable migraine, so stated, without mention of status migrainosus 03/26/2014  . Peripheral neuropathy 03/03/2014  . Abnormal stress test 02/12/2014  . CVA (cerebral infarction) 02/12/2014  . Chest pain   . Abnormal heart rhythm   . Edema   . Hypertension   . Hyperlipemia   . Hypercholesterolemia   . Other and unspecified hyperlipidemia 11/14/2013  . Type II diabetes mellitus, uncontrolled ([place]) 07/22/2013  . Essential hypertension 07/22/2013  . 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 Diabetic polyneuropathy ([place]) 07/22/2013    Past Medical History:      Past Medical History:  Diagnosis Date  . Anxiety   . Arthritis   . Bipolar disorder ([place])   . Broken jaw ([place])   . Broken wrist   . Chronic back pain   . Claudication ([place])    a. 12/2013 ABI's: R 0.97, L 0.94.  [name] COPD (chronic obstructive pulmonary disease) ([place])   . Depression   . Diabetic Charcot's foot ([place])   .  Diabetic foot ulcer (HCC)    chronic left foot ulcer , great toe/  hx recurrent foot ulcer bilaterally  . DJD (degenerative joint disease)   . Edema of both lower extremities   . Episode of memory loss   . GERD (gastroesophageal reflux disease)   . Headache(784.0)   . Hiatal hernia   . History of colon cancer, stage I dx 02-26-2015--- oncologist-- dr [name]--- per last note no recurrence   05-16-2015 s/p  Laparoscopic sigmoid colectomy w/ node bx's (negative nodes per path)  Stage I (pT1,N0,M0) Grade 2  . 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   per MRI multiple small strokes post cardiac cath. --  residual mild memory loss  . History of methicillin resistant staphylococcus aureus (MRSA)   . Hypercholesterolemia   . Insomnia   . Insulin dependent type 2 diabetes mellitus, uncontrolled ([place]) dx 2004   endocrinologist-  dr Dwyane Dee-- last A1c 8.8 in Aug2018:  pt is noncompliant w/ diet, states does note eat breakfast , her first main meal in afternoon  . Neurogenic bladder   . Neuropathy, diabetic ([place])    hands and feet  . OSA (obstructive sleep apnea)    cpap intolerant  . Personality disorder ([place])   . SOB (shortness of breath) on exertion   . Stroke ([place])   . SUI (stress urinary incontinence, female) S/P SLING 12-29-2011   Past Surgical History:  Past Surgical History:  Procedure Laterality Date  . ABDOMINAL HYSTERECTOMY    . AMPUTATION Right 04/13/2018   Procedure: RIGHT BELOW KNEE AMPUTATION;  Surgeon: Newt Minion, MD;  Location: [facility];  Service: Orthopedics;  Laterality: Right;  . ANTERIOR CERVICAL DECOMP/DISCECTOMY FUSION  2000   C5 - 7  . CARDIAC CATHETERIZATION  05-22-2008   DR SKAINS   NO SIGNIFECANT CAD/ NORMAL LV/  EF 65%/  NO WALL MOTION ABNORMALITIES  . CARPAL TUNNEL RELEASE Right 04-25-2013  . [place]; 1992  . COLONOSCOPY    . CYSTO N/A 04/30/2013   Procedure: CYSTOSCOPY;  Surgeon:  Reece Packer, MD;  Location: [facility];  Service: Urology;  Laterality: N/A;  . CYSTOSCOPY MACROPLASTIQUE IMPLANT N/A 02/06/2018   Procedure: CYSTOSCOPY MACROPLASTIQUE IMPLANT;  Surgeon: Bjorn Loser, MD;  Location: [facility];  Service: Urology;  Laterality: N/A;  . CYSTOSCOPY WITH INJECTION  05/04/2012   Procedure: CYSTOSCOPY WITH INJECTION;  Surgeon: Reece Packer, MD;  Location: [facility];  Service: Urology;  Laterality: N/A;  MACROPLASTIQUE INJECTION  . CYSTOSCOPY WITH INJECTION  08/28/2012   Procedure: CYSTOSCOPY WITH INJECTION;  Surgeon: Reece Packer, MD;  Location: [facility];  Service: Urology;  Laterality: N/A;  cysto and macroplastique   . FOOT SURGERY Bilateral   . HERNIA REPAIR  ?1996   "stomach"  . INCISION AND DRAINAGE OF WOUND Right 04/11/2018   Procedure: IRRIGATION AND DEBRIDEMENT WOUND right foot and right ankle;  Surgeon: Evelina Bucy, DPM;  Location: [facility];  Service: Podiatry;  Laterality: Right;  . KNEE ARTHROSCOPY W/ ALLOGRAFT IMPANT Left    graft x 2  . KNEE SURGERY     TOTAL 8 SURG'S  . LAPAROSCOPIC SIGMOID COLECTOMY N/A 04/22/2015   Procedure: LAPAROSCOPIC HAND ASSISTED SIGMOID COLECTOMY;  Surgeon: Erroll Luna, MD;  Location: [facility];  Service: General;  Laterality: N/A;  . LEFT HEART CATHETERIZATION WITH CORONARY ANGIOGRAM N/A 02/12/2014   Procedure: LEFT HEART CATHETERIZATION WITH CORONARY ANGIOGRAM;  Surgeon: Candee Furbish, MD;  Location: [facility];  Service: Cardiovascular;  Laterality: N/A;   No angiographically significant CAD; normal LVSF, LVEDP 21mHg,  EF 55% (new finding ef 30% myoview 01-08-2014)  . LUMBAR FUSION    . MANDIBLE FRACTURE SURGERY    . MULTIPLE LAPAROSCOPIES FOR ENDOMETRIOSIS    . PUBOVAGINAL SLING  12/29/2011   Procedure: PGaynelle Arabian  Surgeon: SReece Packer MD;  [facility]  Service: Urology;  Laterality: N/A;  cysto  and sparc sling   . PUBOVAGINAL SLING N/A 04/30/2013   Procedure: REMOVAL OF VAGINAL MESH;  Surgeon: SReece Packer MD;  [facility];  Service: Urology;  Laterality: N/A;  . RECONSTURCTION OF CONGENITAL UTERUS ANOMALY  1983  . REPEAT RECONSTRUCTION ACL LEFT KNEE/ SCREWS REMOVED  03-28-2000   CADAVER GRAFT  . TOTAL ABDOMINAL HYSTERECTOMY W/ BILATERAL SALPINGOOPHORECTOMY  1997  . TRANSTHORACIC ECHOCARDIOGRAM  02/13/2014   ef 45%, hypokinesis base inferior and base inferolateral walls  . UPPER GI ENDOSCOPY    . WOUND DEBRIDEMENT Right 09/05/2016   Procedure: DEBRIDEMENT WOUND WITH GRAFT RIGHT FOOT;  Surgeon: TLandis Martins DPM;  [place] [facility]  Service: Podiatry;  Laterality: Right;  . WRIST FRACTURE SURGERY      Assessment & Plan Clinical Impression: Patient is a 52y.o. year old female history of COPD with ongoing tobacco use 3 packs a day, obstructive sleep apnea, poorly controlled diabetes mellitus  with insensate neuropathy and retinopathy, bilateral lower extremity edema, Charcot foot; who was admitted via ED on 04/09/2018 with weakness mental status changes and worsening of foot wound. She was found to have strep agalactiae sepsis and was started on IV antibiotics for treatment. MRI of the foot revealed early osteomyelitis with with abscess formation and she underwent I&D of right foot wound without improvement in healing. Dr. 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  Patient transferred to CIR on 04/16/2018 .    Pt presents with decreased activity tolerance, decreased functional mobility, decreased balance, anxiety Limiting pt's independence with leisure/community pursuits.   Plan Min 1 TR session >25 minutes during LOS Recommendations for other services: Neuropsych  Discharge Criteria: Patient will be discharged from TR if patient refuses treatment 3  consecutive times without medical reason.  If treatment goals not met, if there is a change in medical status, if patient makes no progress towards goals or if patient is discharged from hospital.  The above assessment, treatment plan, treatment alternatives and goals were discussed and mutually agreed upon: by patient  [place] 04/19/2018, 11:49 AM

## 2018-04-19 NOTE — Progress Notes (Signed)
Occupational Therapy Session Note  Patient Details  Name: [name] MRN: [id number] Date of Birth: 1966-07-30  Today's Date: 04/19/2018 OT Individual Time: 6283-1517 OT Individual Time Calculation (min): 70 min    Short Term Goals: Week 1:  OT Short Term Goal 1 (Week 1): LTg=STG  Skilled Therapeutic Interventions/Progress Updates:    Upon entering the room, pt very emotional over loss of limb. OT provided therapeutic use of self and educated pt on importance of keeping herself healthy, active, and medically stable (L LE and glucose). Pt verbalized understanding but not doing this on rehab unit. Pt requesting to wash and change clothing while seated on EOB with focus on lateral leans for LB hygiene and clothing management. Pt completed task with supervision/set up to obtain items and thread clothing with wound vac. Pt performed stand pivot transfer from bed >wheelchair with supervision overall. Grooming at sink with mod I and OT assisted pt with washing hair. Pt donned leg rests with increased time and min verbal cues for technique. Chair alarm activated and quick release belt donned. Her son is present in the room as therapist exits.   Therapy Documentation Precautions:  Precautions Precautions: Fall Precaution Comments: wound vac R; new R BKA Required Braces or Orthoses: Other Brace/Splint Other Brace/Splint: Limb guard for RLE Restrictions Weight Bearing Restrictions: Yes RLE Weight Bearing: Non weight bearing LLE Weight Bearing: Weight bearing as tolerated General:   Vital Signs: Therapy Vitals Temp: 98.6 F (37 C) Temp Source: Oral Pulse Rate: 88 Resp: 18 BP: 120/72 Patient Position (if appropriate): Lying Oxygen Therapy SpO2: 93 % O2 Device: Room Air Pain:   ADL: ADL ADL Comments: see functional navigator Vision   Perception    Praxis   Exercises:   Other Treatments:    See Function Navigator for Current Functional Status.   Therapy/Group:  Individual Therapy  [name] 04/19/2018, 9:31 AM

## 2018-04-19 NOTE — Progress Notes (Signed)
Recreational Therapy Session Note  Patient Details  Name: [name] MRN: [id number] Date of Birth: 1966/07/07 Today's Date: 04/19/2018  Pain: no c/o Skilled Therapeutic Interventions/Progress Updates: Session focused on anxiety management techniques.  Introduced, instructed pt and demonstrated diaphragmatic breathing, pt appeared interested.  Also introduced guided imagery and progressive muscle  Relaxation.  Provided pt with adult coloring pages and colored pencils in the room.  will continue to monitor.   Therapy/Group: Individual Therapy   Ceonna Frazzini 04/19/2018, 11:57 AM

## 2018-04-19 NOTE — Progress Notes (Signed)
[facility] PHYSICAL MEDICINE & REHABILITATION     PROGRESS NOTE    Subjective/Complaints: Pt states that she has anxiety over amputation and would like something for anxiety. "  I do not want to even look at my leg.  I feel like I am in a bad dream regarding the amputation"  ROS: Patient denies fever, rash, sore throat, blurred vision, nausea, vomiting, diarrhea, cough, shortness of breath or chest pain, back pain, headache, or mood change.   Objective: Vital Signs: Blood pressure 120/72, pulse 88, temperature 98.6 F (37 C), temperature source Oral, resp. rate 18, height 5\' 5"  (1.651 m), weight 95.3 kg (210 lb), SpO2 93 %. No results found. Recent Labs    04/17/18 0522  WBC 13.6*  HGB 10.4*  HCT 32.7*  PLT 391   Recent Labs    04/17/18 0522  NA 138  K 3.7  CL 103  GLUCOSE 121*  BUN 5*  CREATININE 0.57  CALCIUM 8.2*   CBG (last 3)  Recent Labs    04/18/18 1701 04/18/18 2050 04/19/18 0621  GLUCAP 194* 165* 158*    Wt Readings from Last 3 Encounters:  04/18/18 95.3 kg (210 lb)  04/13/18 96 kg (211 lb 10.3 oz)  03/19/18 90.3 kg (199 lb)    Physical Exam:  Constitutional: No distress . Vital signs reviewed. HEENT: EOMI, oral membranes moist Neck: supple Cardiovascular: RRR without murmur. No JVD    Respiratory: CTA Bilaterally without wheezes or rales. Normal effort    GI: BS +, non-tender, non-distended  Musculoskeletal: R-BKAwithPrevana VAC. leg tender to palpatation Neurological: She isalertand oriented to person, place, and time. Motor 5/5 UE Skin: Skin iswarmand dry. Callus on left first MTP, old wound, small area of bruising between 3rd/4th toes. Psychiatric: anxious somewhat    Assessment/Plan: 1. Functional deficits secondary to right BKA which require 3+ hours per day of interdisciplinary therapy in a comprehensive inpatient rehab setting. Physiatrist is providing close team supervision and 24 hour management of active medical problems  listed below. Physiatrist and rehab team continue to assess barriers to discharge/monitor patient progress toward functional and medical goals.  Function:  Bathing Bathing position   Position: Wheelchair/chair at sink  Bathing parts Body parts bathed by patient: Right arm, Left arm, Chest, Abdomen, Front perineal area, Buttocks, Right upper leg, Left upper leg, Left lower leg Body parts bathed by helper: Back  Bathing assist Assist Level: Touching or steadying assistance(Pt > 75%)      Upper Body Dressing/Undressing Upper body dressing   What is the patient wearing?: Pull over shirt/dress     Pull over shirt/dress - Perfomed by patient: Thread/unthread right sleeve, Thread/unthread left sleeve, Put head through opening, Pull shirt over trunk          Upper body assist Assist Level: Touching or steadying assistance(Pt > 75%)      Lower Body Dressing/Undressing Lower body dressing   What is the patient wearing?: Socks, Shoes             Socks - Performed by patient: Don/doff left sock   Shoes - Performed by patient: Don/doff left shoe, Fasten left            Lower body assist Assist for lower body dressing: Supervision or verbal cues      Toileting Toileting   Toileting steps completed by patient: Adjust clothing prior to toileting, Performs perineal hygiene, Adjust clothing after toileting      Toileting assist Assist level: Touching or steadying  assistance (Pt.75%)   Transfers Chair/bed transfer   Chair/bed transfer method: Stand pivot Chair/bed transfer assist level: Touching or steadying assistance (Pt > 75%)       Locomotion Ambulation     Max distance: 4 Assist level: Touching or steadying assistance (Pt > 75%)   Wheelchair   Type: Manual Max wheelchair distance: 150 Assist Level: Supervision or verbal cues  Cognition Comprehension Comprehension assist level: Understands complex 90% of the time/cues 10% of the time  Expression Expression  assist level: Expresses complex 90% of the time/cues < 10% of the time  Social Interaction Social Interaction assist level: Interacts appropriately 90% of the time - Needs monitoring or encouragement for participation or interaction.  Problem Solving Problem solving assist level: Solves basic 90% of the time/requires cueing < 10% of the time  Memory Memory assist level: Recognizes or recalls 90% of the time/requires cueing < 10% of the time   Medical Problem List and Plan: 1.Functional deficitssecondary to right BKA -continue therapies 2. DVT Prophylaxis/Anticoagulation: Pharmaceutical:Lovenox 3. Pain Management:oxycodone prn. Educate patient on desensitization. Titrate gabapentin upwards as tolerated and needed. -limb guard for stump protection 4. Mood:pt with anxiety/PTSD sx related to amputation   -has hx of bipolar/personality disorder  -add LOW dose xanax for breakthrough anxiety  -Increase nortriptyline to 25 mg nightly for sleep and mood.   -We will request neuropsychology evaluation as well 5. Neuropsych: This patientiscapable of making decisions on herown behalf. 6. Skin/Wound Care:Monitor wound for healing. Routine pressure relief measures.   -remove vac tomorrow 7. Fluids/Electrolytes/Nutrition:Monitor I/O.   - .    8.T2DM: Monitor BS ac/hs. Continue Lantus 24u daily. with SSI. Resumed meal coverage at 5 units tid ac.    -sugars under reasonable control despite non-compliance   -Discussed the importance of better control of her blood sugars.  Patient has little to no insight.  Immediately after our discussion, as I left the room she asked if I could switch her to a regular diet. 9. Step agalactiae sepsis: I changed IV ampicillin to amoxicillin to complete course.  Ends on 4/28  10. Bipolar disorder/Personality disorder: On Seroquel at bedtime. 11. Leukocytosis: likely reactive---recheck tomorrow   LOS (Days) 3 A FACE TO FACE EVALUATION  WAS PERFORMED  Meredith Staggers, MD 04/19/2018 9:11 AM

## 2018-04-19 NOTE — Progress Notes (Signed)
Physical Therapy Session Note  Patient Details  Name: [name] MRN: [id number] Date of Birth: 1966/09/19  Today's Date: 04/19/2018 PT Individual Time: 2111-7356 PT Individual Time Calculation (min): 45 min   Short Term Goals: Week 1:  PT Short Term Goal 1 (Week 1): = LTGs due to ELOS  Skilled Therapeutic Interventions/Progress Updates:    Focused on functional w/c propulsion for UE endurance/strengthening and mobility training. Pt requires encouragement to continue once UE's are tired. Educated on importance of building up UE strength and muscular endurance as this is primary way of mobility at this time, even using RW requires significant UE strength. Focused on squat pivot transfer training with emphasis on pt managing w/c parts and setting up in safe way - requires cues for removal of legrests but demonstrates good positioning of w/c and locking of brakes prior to transfer. Completed squat pivot transfer with close supervision. Seated LE therex during functional rest breaks including RLE LAQ, marches, and isometric hip abduction with 5 second hold x 10 reps each. Dynamic standing balance retraining with RW with min assist for 1 UE support while performing functional reaching task and educated on relation to home environment simulation (ex. In kitchen) and how center of gravity has changed and increased fall risk. Pt verbalized understanding and then becoming emotional about realization about change in function since amputation occurred. Discussed option for peer visitor but pt declines at this time. neuropsych has been contacted to set up to see pt while here on CIR. Emotional support and education provided to pt on ways to modify and adapt activities and eventual progression with prosthesis.   Therapy Documentation Precautions:  Precautions Precautions: Fall Precaution Comments: wound vac R; new R BKA Required Braces or Orthoses: Other Brace/Splint Other Brace/Splint: Limb guard for  RLE Restrictions Weight Bearing Restrictions: Yes RLE Weight Bearing: Non weight bearing LLE Weight Bearing: Weight bearing as tolerated Pain:  Premedicated for residual limb pain.    See Function Navigator for Current Functional Status.   Therapy/Group: Individual Therapy  Canary Brim Ivory Broad, PT, DPT  04/19/2018, 10:38 AM

## 2018-04-19 NOTE — IPOC Note (Signed)
Overall Plan of Care [facility]) Patient Details Name: [name] MRN: [id number] DOB: 1966-03-09  Admitting Diagnosis: <principal problem not specified> right below-knee amputation  Hospital Problems: Active Problems:   Unilateral complete BKA (HCC)   PAD (peripheral artery disease) ([place])   Poorly controlled type 2 diabetes mellitus ([place])     Functional Problem List: Nursing Bowel, Endurance, Medication Management, Motor, Nutrition, Pain, Safety, Perception, Skin Integrity, Sensory  PT Balance, Edema, Endurance, Motor, Pain, Safety, Sensory, Skin Integrity  OT Balance, Edema, Endurance, Motor, Pain, Safety, Skin Integrity  SLP    TR         Basic ADL's: OT Grooming, Bathing, Dressing, Toileting     Advanced  ADL's: OT Simple Meal Preparation     Transfers: PT Bed to Chair, Bed Mobility, Car, Manufacturing systems engineer, Metallurgist: PT Ambulation, Emergency planning/management officer     Additional Impairments: OT None  SLP        TR      Anticipated Outcomes Item Anticipated Outcome  Self Feeding n/a  Swallowing      Basic self-care  mod I   Toileting  mod I    Bathroom Transfers mod I  Bowel/Bladder  mod I   Transfers  mod I to supervision basic transfers  Locomotion  mod I w/c mobility; supervision gait with RW  Communication     Cognition     Pain  less than 4  Safety/Judgment  mod I    Therapy Plan: PT Intensity: Minimum of 1-2 x/day ,45 to 90 minutes PT Frequency: 5 out of 7 days PT Duration Estimated Length of Stay: 7-10 days OT Intensity: Minimum of 1-2 x/day, 45 to 90 minutes OT Frequency: 5 out of 7 days OT Duration/Estimated Length of Stay: ~7-10 days      Team Interventions: Nursing Interventions Patient/Family Education, Bowel Management, Disease Management/Prevention, Pain Management, Medication Management, Skin Care/Wound Management, Other (comment)(diet management)  PT interventions Ambulation/gait training, Human resources officer, Community reintegration, Discharge planning, Disease management/prevention, DME/adaptive equipment instruction, Neuromuscular re-education, Functional mobility training, Pain management, Patient/family education, Splinting/orthotics, Psychosocial support, Skin care/wound management, Therapeutic Activities, Therapeutic Exercise, UE/LE Strength taining/ROM, UE/LE Coordination activities, Wheelchair propulsion/positioning  OT Interventions Training and development officer, Disease mangement/prevention, Self Care/advanced ADL retraining, Therapeutic Exercise, Wheelchair propulsion/positioning, UE/LE Strength taining/ROM, Pain management, Community reintegration, Barrister's clerk education, Splinting/orthotics, UE/LE Coordination activities, Discharge planning, Functional mobility training, Psychosocial support, Therapeutic Activities  SLP Interventions    TR Interventions    SW/CM Interventions Discharge Planning, Psychosocial Support, Patient/Family Education   Barriers to Discharge MD  Medical stability and Behavior  Nursing IV antibiotics, Wound Care    PT      OT      SLP      SW       Team Discharge Planning: Destination: PT-Home ,OT- Home , SLP-  Projected Follow-up: PT-Home health PT, OT-  Home health OT, SLP-  Projected Equipment Needs: PT-Rolling walker with 5" wheels, Wheelchair (measurements), Wheelchair cushion (measurements), OT- To be determined, SLP-  Equipment Details: PT-18x18 w/c with R amputee pad;, OT-  Patient/family involved in discharge planning: PT- Patient, Family member/caregiver,  OT-Patient, SLP-   MD ELOS: 7 to 10 days Medical Rehab Prognosis:  Excellent Assessment: The patient has been admitted for CIR therapies with the diagnosis of right below-knee amputation. The team will be addressing functional mobility, strength, stamina, balance, safety, adaptive techniques and equipment, self-care, bowel and bladder mgt, patient and caregiver education, pain  management, ego support,  wound care, pre-prosthetic education, diabetes control. Goals have been set at modified independent at that household level for self-care .  Patient will have modified independent wheelchair goals and supervision gait goals.  Meredith Staggers, MD, FAAPMR      See Team Conference Notes for weekly updates to the plan of care

## 2018-04-19 NOTE — Progress Notes (Signed)
Social Work  Social Work Assessment and Plan  Patient Details  Name: [name] MRN: [id number] Date of Birth: 1966/04/15  Today's Date: 04/19/2018  Problem List:  Patient Active Problem List   Diagnosis Date Noted  . 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 PAD (peripheral artery disease) ([place])   . Poorly controlled type 2 diabetes mellitus ([place])   . Cutaneous abscess of right foot   . Subacute osteomyelitis, right ankle and foot ([place])   . Major depressive disorder, recurrent episode ([place]) 04/11/2018  . Diabetic ulcer of right midfoot associated with type 2 diabetes mellitus, with necrosis of bone ([place])   . Abscess of ankle   . Infectious synovitis   . Diabetic foot infection ([place])   . Severe sepsis ([place]) 04/09/2018  . Cancer of sigmoid colon ([place]) 03/09/2015  . OSA (obstructive sleep apnea) 07/04/2014  . Migraine without aura, with intractable migraine, so stated, without mention of status migrainosus 03/26/2014  . Peripheral neuropathy 03/03/2014  . Abnormal stress test 02/12/2014  . CVA (cerebral infarction) 02/12/2014  . Chest pain   . Abnormal heart rhythm   . Edema   . Hypertension   . Hyperlipemia   . Hypercholesterolemia   . Other and unspecified hyperlipidemia 11/14/2013  . Type II diabetes mellitus, uncontrolled ([place]) 07/22/2013  . Essential hypertension 07/22/2013  . 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 Diabetic polyneuropathy ([place]) 07/22/2013   Past Medical History:  Past Medical History:  Diagnosis Date  . Anxiety   . Arthritis   . Bipolar disorder ([place])   . Broken jaw ([place])   . Broken wrist   . Chronic back pain   . Claudication ([place])    a. 12/2013 ABI's: R 0.97, L 0.94.  [name] COPD (chronic obstructive pulmonary disease) ([place])   . Depression   . Diabetic Charcot's foot ([place])   . Diabetic foot ulcer (HCC)    chronic left foot ulcer , great toe/  hx recurrent foot ulcer  bilaterally  . DJD (degenerative joint disease)   . Edema of both lower extremities   . Episode of memory loss   . GERD (gastroesophageal reflux disease)   . Headache(784.0)   . Hiatal hernia   . History of colon cancer, stage I dx 02-26-2015--- oncologist-- dr [name]--- per last note no recurrence   05-16-2015 s/p  Laparoscopic sigmoid colectomy w/ node bx's (negative nodes per path)  Stage I (pT1,N0,M0) Grade 2  . 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   per MRI multiple small strokes post cardiac cath. --  residual mild memory loss  . History of methicillin resistant staphylococcus aureus (MRSA)   . Hypercholesterolemia   . Insomnia   . Insulin dependent type 2 diabetes mellitus, uncontrolled ([place]) dx 2004   endocrinologist-  dr Dwyane Dee-- last A1c 8.8 in Aug2018:  pt is noncompliant w/ diet, states does note eat breakfast , her first main meal in afternoon  . Neurogenic bladder   . Neuropathy, diabetic ([place])    hands and feet  . OSA (obstructive sleep apnea)    cpap intolerant  . Personality disorder ([place])   . SOB (shortness of breath) on exertion   . Stroke ([place])   . SUI (stress urinary incontinence, female) S/P SLING 12-29-2011   Past Surgical History:  Past Surgical History:  Procedure Laterality Date  . ABDOMINAL HYSTERECTOMY    . AMPUTATION Right 04/13/2018   Procedure:  RIGHT BELOW KNEE AMPUTATION;  Surgeon: Newt Minion, MD;  Location: [facility];  Service: Orthopedics;  Laterality: Right;  . ANTERIOR CERVICAL DECOMP/DISCECTOMY FUSION  2000   C5 - 7  . CARDIAC CATHETERIZATION  05-22-2008   DR SKAINS   NO SIGNIFECANT CAD/ NORMAL LV/  EF 65%/  NO WALL MOTION ABNORMALITIES  . CARPAL TUNNEL RELEASE Right 04-25-2013  . [place]; 1992  . COLONOSCOPY    . CYSTO N/A 04/30/2013   Procedure: CYSTOSCOPY;  Surgeon: Reece Packer, MD;  Location: [facility];  Service: Urology;  Laterality: N/A;  . CYSTOSCOPY MACROPLASTIQUE IMPLANT N/A 02/06/2018    Procedure: CYSTOSCOPY MACROPLASTIQUE IMPLANT;  Surgeon: Bjorn Loser, MD;  Location: [facility];  Service: Urology;  Laterality: N/A;  . CYSTOSCOPY WITH INJECTION  05/04/2012   Procedure: CYSTOSCOPY WITH INJECTION;  Surgeon: Reece Packer, MD;  Location: [facility];  Service: Urology;  Laterality: N/A;  MACROPLASTIQUE INJECTION  . CYSTOSCOPY WITH INJECTION  08/28/2012   Procedure: CYSTOSCOPY WITH INJECTION;  Surgeon: Reece Packer, MD;  Location: [facility];  Service: Urology;  Laterality: N/A;  cysto and macroplastique   . FOOT SURGERY Bilateral   . HERNIA REPAIR  ?1996   "stomach"  . INCISION AND DRAINAGE OF WOUND Right 04/11/2018   Procedure: IRRIGATION AND DEBRIDEMENT WOUND right foot and right ankle;  Surgeon: Evelina Bucy, DPM;  Location: [facility];  Service: Podiatry;  Laterality: Right;  . KNEE ARTHROSCOPY W/ ALLOGRAFT IMPANT Left    graft x 2  . KNEE SURGERY     TOTAL 8 SURG'S  . LAPAROSCOPIC SIGMOID COLECTOMY N/A 04/22/2015   Procedure: LAPAROSCOPIC HAND ASSISTED SIGMOID COLECTOMY;  Surgeon: Erroll Luna, MD;  Location: [facility];  Service: General;  Laterality: N/A;  . LEFT HEART CATHETERIZATION WITH CORONARY ANGIOGRAM N/A 02/12/2014   Procedure: LEFT HEART CATHETERIZATION WITH CORONARY ANGIOGRAM;  Surgeon: Candee Furbish, MD;  Location: [facility];  Service: Cardiovascular;  Laterality: N/A;   No angiographically significant CAD; normal LVSF, LVEDP 72mmHg,  EF 55% (new finding ef 30% myoview 01-08-2014)  . LUMBAR FUSION    . MANDIBLE FRACTURE SURGERY    . MULTIPLE LAPAROSCOPIES FOR ENDOMETRIOSIS    . PUBOVAGINAL SLING  12/29/2011   Procedure: Gaynelle Arabian;  Surgeon: Reece Packer, MD;  Location[facility];  Service: Urology;  Laterality: N/A;  cysto and sparc sling   . PUBOVAGINAL SLING N/A 04/30/2013   Procedure: REMOVAL OF VAGINAL MESH;  Surgeon: Reece Packer, MD;  [facility];  Service:  Urology;  Laterality: N/A;  . RECONSTURCTION OF CONGENITAL UTERUS ANOMALY  1983  . REPEAT RECONSTRUCTION ACL LEFT KNEE/ SCREWS REMOVED  03-28-2000   CADAVER GRAFT  . TOTAL ABDOMINAL HYSTERECTOMY W/ BILATERAL SALPINGOOPHORECTOMY  1997  . TRANSTHORACIC ECHOCARDIOGRAM  02/13/2014   ef 45%, hypokinesis base inferior and base inferolateral walls  . UPPER GI ENDOSCOPY    . WOUND DEBRIDEMENT Right 09/05/2016   Procedure: DEBRIDEMENT WOUND WITH GRAFT RIGHT FOOT;  Surgeon: Landis Martins, DPM;  Location: [facility];  Service: Podiatry;  Laterality: Right;  . WRIST FRACTURE SURGERY     Social History:  reports that she has been smoking cigarettes.  She has a 180.00 pack-year smoking history. She has never used smokeless tobacco. She reports that she does not drink alcohol or use drugs.  Family / Support Systems Marital Status: Married How Long?: Pt states very clearly that she and Shanon Brow  are married and have been since Oct 2018.  They have been "together off and on for 20 yrs..." Patient Roles: Spouse, Parent Spouse/Significant Other: spouse, Etta Quill @ [phone] Children: son, [name] [facility]) @ (C) [phone];  Pt also has an adult, severly handicapped daughter who lives in "a private home" in [place], [place].   Other Supports: aunt, Jodene Nam (local) @ (H) [phone] Anticipated Caregiver: Shanon Brow  Ability/Limitations of Caregiver: Pt reports that spouse is also on SSD due to multiple cardiac issues and is limited in lifting. Caregiver Availability: 24/7 Family Dynamics: Pt describes family as supporitve, however, then makes remark "... but they got both legs!... they don't know what this is like."  Social History Preferred language: English Religion: Non-Denominational Cultural Background: NA Read: Yes Write: Yes Employment Status: Disabled Date Retired/Disabled/Unemployed: 1995 Freight forwarder Issues: None Guardian/Conservator: None -per MD, pt is capable of  making decisions on her own behalf.   Abuse/Neglect Abuse/Neglect Assessment Can Be Completed: Yes Physical Abuse: Denies Verbal Abuse: Denies Sexual Abuse: Denies Exploitation of patient/patient's resources: Denies Self-Neglect: Denies  Emotional Status Pt's affect, behavior adn adjustment status: Pt presents with very angry affect.  Seems somewhat resistant to complete assessment interview but we did work through this.  She admits she is very angry about the amputation and feels she was "...never told what my options were...they just came in and took my leg!  Now I can't do anything for myself and I'm a control person."  Attempted to discuss possible reasons for emergent BKA due to infection, however, pt does not respond.  She has multiple complaints about hospital, staff and states she wants to go home ASAP.  We did discuss referral to unit psychologist and she is agreeable.  (Dr. Naaman Plummer has made the referral).    Recent Psychosocial Issues: Chronic health issues for pt, spouse and other family members which cause pt "a lot of stress... and now this happens..." Pyschiatric History: Pt has long h/o of bipolar d/c and is followed q month at [facility] in [place]. Substance Abuse History: NA  Patient / Family Perceptions, Expectations & Goals Pt/Family understanding of illness & functional limitations: As noted, pt has good understanding of current functional limitations/ need for CIR.  She does NOT feel, however, that she was offered a good explaination of the reasons for BKA. Premorbid pt/family roles/activities: Pt was independent overall. Anticipated changes in roles/activities/participation: Pt with w/c level goals at mod ind.  Spouse may need to increase level of support he is providing. Pt/family expectations/goals: "I just want to go home.  You can't change the fact that my leg is gone."  [employer]: None Premorbid Home Care/DME Agencies: Other  (Comment)([facility]? providing [place] visits for wound care) Transportation available at discharge: yes Resource referrals recommended: Neuropsychology, Support group (specify)  Discharge Planning Living Arrangements: Spouse/significant other Support Systems: Spouse/significant other, Children, Other relatives Type of Residence: Private residence Insurance Resources: Medicaid (specify county), [employer] Resources: SSD, [employer] Financial Screen Referred: No Living Expenses: Education officer, community Management: Patient, Spouse Does the patient have any problems obtaining your medications?: No Home Management: pt and spouse Patient/Family Preliminary Plans: Pt to d/c home with spouse who is able to provide 24/7 support. Social Work Anticipated Follow Up Needs: HH/OP, Support Group Expected length of stay: 7-10 days   Clinical Impression Unfortunate woman here following osteo in leg and underwent a BKA.  She is visibly frustrated with her situation and states several times that she  does not feel she was "given a choice" about the plan for BKA.  She does not respond to attempts to process the circumstances of having an infection and likely need to proceed emergently.  She chooses to remain very angry about this as well as insistent that she be d/c'd earlier than estimated by team.  Attempted to forge some rapport but very difficult.  She does note chronic h/o bipolar for which she is followed as an outpatient.  She is open to unit psychologist involvement.  She understands my role but simply wants me to get her ready to d/c home ASAP.  Reyanne Hussar 04/19/2018, 3:41 PM

## 2018-04-19 NOTE — Progress Notes (Signed)
Social Work Patient ID: [name], female   DOB: [birth year], 52 y.o.   MRN: [id number]   Have reviewed team conference with pt who is aware of targeted d/c date of 5/2 and mod ind w/c level goals, however, she states, "well I don't plan on staying that long."  Attempted to explain that therapies can consider earlier d/c date if she is able to reach goals sooner.  Pt states she doesn't plan on being here still on Monday.  Encouraged her to speak with therapies about her desire for an earlier d/c and with MD as well.    Myan Locatelli, LCSW

## 2018-04-19 NOTE — Progress Notes (Signed)
Physical Therapy Note  Patient Details  Name: [name] MRN: [id number] Date of Birth: January 15, 1966 Today's Date: 04/19/2018    Time: 4765-4650 35 minutes  1:1 pt performs w/c mobility throughout unit with supervision, supervision for w/c obstacle course.  W/c negotiation up/down ramp with pt requiring min/mod A for w/c control.  Toilet transfer with mod cues for safety with set up, min A for transfer and clothing management.  Seated UE and core therex with 2# wts bilat for elbow, shoulder and core strengthening in all planes.  Supine and sidelying LE therex with pt requiring frequent rests due to residual limb pain. Pt states she is not interested in tapping or touching residual limb to reduce pain.  Pt left in bed with needs at hand, alarm set.   [name] 04/19/2018, 3:07 PM

## 2018-04-20 ENCOUNTER — Inpatient Hospital Stay (HOSPITAL_COMMUNITY): Payer: Self-pay | Admitting: Physical Therapy

## 2018-04-20 ENCOUNTER — Inpatient Hospital Stay (HOSPITAL_COMMUNITY): Payer: Self-pay | Admitting: Occupational Therapy

## 2018-04-20 ENCOUNTER — Inpatient Hospital Stay (HOSPITAL_COMMUNITY): Payer: Medicare Other

## 2018-04-20 LAB — GLUCOSE, CAPILLARY
GLUCOSE-CAPILLARY: 123 mg/dL — AB (ref 65–99)
GLUCOSE-CAPILLARY: 207 mg/dL — AB (ref 65–99)
Glucose-Capillary: 173 mg/dL — ABNORMAL HIGH (ref 65–99)
Glucose-Capillary: 177 mg/dL — ABNORMAL HIGH (ref 65–99)

## 2018-04-20 MED ORDER — ALPRAZOLAM 0.5 MG PO TABS
0.5000 mg | ORAL_TABLET | Freq: Three times a day (TID) | ORAL | Status: DC | PRN
  Administered 2018-04-20 – 2018-04-25 (×8): 0.5 mg via ORAL
  Filled 2018-04-20 (×8): qty 1

## 2018-04-20 NOTE — Progress Notes (Signed)
[facility] PHYSICAL MEDICINE & REHABILITATION     PROGRESS NOTE    Subjective/Complaints: Says that xanax isn't strong enough for her anxiety. Concerned about area on left MTP  ROS: Patient denies fever, rash, sore throat, blurred vision, nausea, vomiting, diarrhea, cough, shortness of breath or chest pain, joint or back pain, headache, or mood change.    Objective: Vital Signs: Blood pressure (!) 144/60, pulse 80, temperature 99.1 F (37.3 C), temperature source Oral, resp. rate 18, height 5\' 5"  (1.651 m), weight 97.1 kg (214 lb 1.1 oz), SpO2 98 %. No results found. No results for input(s): WBC, HGB, HCT, PLT in the last 72 hours. No results for input(s): NA, K, CL, GLUCOSE, BUN, CREATININE, CALCIUM in the last 72 hours.  Invalid input(s): CO CBG (last 3)  Recent Labs    04/19/18 1650 04/19/18 2102 04/20/18 0704  GLUCAP 177* 252* 173*    Wt Readings from Last 3 Encounters:  04/19/18 97.1 kg (214 lb 1.1 oz)  04/13/18 96 kg (211 lb 10.3 oz)  03/19/18 90.3 kg (199 lb)    Physical Exam:  Constitutional: No distress . Vital signs reviewed. HEENT: EOMI, oral membranes moist Neck: supple Cardiovascular: RRR without murmur. No JVD    Respiratory: CTA Bilaterally without wheezes or rales. Normal effort    GI: BS +, non-tender, non-distended  Musculoskeletal: R-BKA incision well approximated with small s/s drainage Neurological: She isalertand oriented to person, place, and time. Motor 5/5 UE Skin: Skin iswarmand dry. Callus on left first MTP, old wound without substantial change---?increased bruising, small area of bruising between 3rd/4th toes. Psychiatric: remain anxioust    Assessment/Plan: 1. Functional deficits secondary to right BKA which require 3+ hours per day of interdisciplinary therapy in a comprehensive inpatient rehab setting. Physiatrist is providing close team supervision and 24 hour management of active medical problems listed below. Physiatrist and  rehab team continue to assess barriers to discharge/monitor patient progress toward functional and medical goals.  Function:  Bathing Bathing position   Position: Wheelchair/chair at sink  Bathing parts Body parts bathed by patient: Right arm, Left arm, Chest, Abdomen, Front perineal area, Buttocks, Right upper leg, Left upper leg, Left lower leg Body parts bathed by helper: Back  Bathing assist Assist Level: Supervision or verbal cues, Set up   Set up : To obtain items  Upper Body Dressing/Undressing Upper body dressing   What is the patient wearing?: Pull over shirt/dress     Pull over shirt/dress - Perfomed by patient: Thread/unthread right sleeve, Thread/unthread left sleeve, Put head through opening, Pull shirt over trunk          Upper body assist Assist Level: Supervision or verbal cues, Set up   Set up : To obtain clothing/put away  Lower Body Dressing/Undressing Lower body dressing   What is the patient wearing?: Socks, Shoes             Socks - Performed by patient: Don/doff left sock   Shoes - Performed by patient: Don/doff left shoe, Fasten left            Lower body assist Assist for lower body dressing: Supervision or verbal cues      Toileting Toileting   Toileting steps completed by patient: Adjust clothing prior to toileting, Performs perineal hygiene, Adjust clothing after toileting   Toileting Assistive Devices: Grab bar or rail  Toileting assist Assist level: More than reasonable time   Transfers Chair/bed transfer   Chair/bed transfer method: Squat pivot Chair/bed  transfer assist level: Supervision or verbal cues Chair/bed transfer assistive device: Bedrails, Armrests     Locomotion Ambulation     Max distance: 4 Assist level: Touching or steadying assistance (Pt > 75%)   Wheelchair   Type: Manual Max wheelchair distance: 150 Assist Level: Supervision or verbal cues  Cognition Comprehension Comprehension assist level: Follows  complex conversation/direction with extra time/assistive device  Expression Expression assist level: Expresses basic 90% of the time/requires cueing < 10% of the time.  Social Interaction Social Interaction assist level: Interacts appropriately 90% of the time - Needs monitoring or encouragement for participation or interaction.  Problem Solving Problem solving assist level: Solves basic 50 - 74% of the time/requires cueing 25 - 49% of the time  Memory Memory assist level: Recognizes or recalls 75 - 89% of the time/requires cueing 10 - 24% of the time   Medical Problem List and Plan: 1.Functional deficitssecondary to right BKA -continue therapies 2. DVT Prophylaxis/Anticoagulation: Pharmaceutical:Lovenox 3. Pain Management:oxycodone prn. gabapentin 4. 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   -have requested neuropsychology evaluation as well 5. Neuropsych: This patientiscapable of making decisions on herown behalf. 6. Skin/Wound Care:Monitor wound for healing. Routine pressure relief measures.   -DSD to stump  -will continue dry dressing/padding of left 1st mtp, darco shoe for transfers 7. Fluids/Electrolytes/Nutrition:Monitor I/O.   - .    8.T2DM: Monitor BS ac/hs. Continue Lantus 24u daily. with SSI. Resumed meal coverage at 5 units tid ac.    -pt with extremely poor insight re: diabetes. Sugars will be difficult to control due to her unwillingness to eat appropriately 9. Step agalactiae sepsis: I changed IV ampicillin to amoxicillin to complete course.  Ends on 4/28  10. Bipolar disorder/Personality disorder: On Seroquel at bedtime. 11. Leukocytosis: likely reactive---12-13k   LOS (Days) [place] EVALUATION WAS PERFORMED  Meredith Staggers, MD 04/20/2018 10:20 AM

## 2018-04-20 NOTE — Progress Notes (Signed)
Orthopedic Tech Progress Note Patient Details:  [name] May 03, 1966 [id number]  Ortho Devices Ortho Device/Splint Location: Droped off Darco shoe patient was out of room       [place] Pink 04/20/2018, 3:30 PM

## 2018-04-20 NOTE — Progress Notes (Signed)
Pt has been instructed to call for assist to bathroom, pt has put herself into her wheelchair and was rolling into the bathroom without assistance, re-instructed pt that she has to have assistance at least for [place] to stand by in case she loses her balance so that she will not fall.  Pt states she is fine and does not need assistance, bed alarm on, alarms, however, pt ignores and still gets up

## 2018-04-20 NOTE — Progress Notes (Signed)
Spoke with Dr Naaman Plummer regarding CT results. Updated on pt's condition.

## 2018-04-20 NOTE — Plan of Care (Signed)
  Problem: Consults Goal: RH LIMB LOSS PATIENT EDUCATION Description Description: See Patient Education module for eduction specifics. Outcome: Progressing Goal: Skin Care Protocol Initiated - if Braden Score 18 or less Description If consults are not indicated, leave blank or document N/A Outcome: Progressing Goal: Nutrition Consult-if indicated Outcome: Not Progressing Goal: Diabetes Guidelines if Diabetic/Glucose > 140 Description If diabetic or lab glucose is > 140 mg/dl - Initiate Diabetes/Hyperglycemia Guidelines & Document Interventions  Outcome: Not Progressing   Problem: RH BOWEL ELIMINATION Goal: RH STG MANAGE BOWEL WITH ASSISTANCE Description STG Manage Bowel with  Mod I  Outcome: Progressing Goal: RH STG MANAGE BOWEL W/MEDICATION W/ASSISTANCE Description STG Manage Bowel with Medication with min Assistance.  Outcome: Progressing   Problem: RH SKIN INTEGRITY Goal: RH STG SKIN FREE OF INFECTION/BREAKDOWN Description Patient and family will be able to verbalize how to prevent breakdown prior to discharge  Outcome: Progressing Goal: RH STG MAINTAIN SKIN INTEGRITY WITH ASSISTANCE Description STG Maintain Skin Integrity With  [place].  Outcome: Progressing   Problem: RH SAFETY Goal: RH STG ADHERE TO SAFETY PRECAUTIONS W/ASSISTANCE/DEVICE Description STG Adhere to Safety Precautions With  Mod I/Device.  Outcome: Not Progressing Goal: RH STG DECREASED RISK OF FALL WITH ASSISTANCE Description STG Decreased Risk of Fall With  [employer].  Outcome: Not Progressing   Problem: RH PAIN MANAGEMENT Goal: RH STG PAIN MANAGED AT OR BELOW PT'S PAIN GOAL Description Less than 4   Outcome: Not Progressing   Problem: RH KNOWLEDGE DEFICIT LIMB LOSS Goal: RH STG INCREASE KNOWLEDGE OF SELF CARE AFTER LIMB LOSS Description Patient and family will verbalize increased knowledge of limb loss care prior to discharge  Outcome: Not Progressing    Pt non-compliant  with diabetic diet, pt non-compliant with safety measures.  Compliant with b/b, c/o pain at all times with little relief.

## 2018-04-20 NOTE — Care Management (Signed)
[facility] Individual Statement of Services  Patient Name:  [name]  Date:  04/20/2018  Welcome to the [facility].  Our goal is to provide you with an individualized program based on your diagnosis and situation, designed to meet your specific needs.  With this comprehensive rehabilitation program, you will be expected to participate in at least 3 hours of rehabilitation therapies Monday-Friday, with modified therapy programming on the weekends.  Your rehabilitation program will include the following services:  Physical Therapy (PT), Occupational Therapy (OT), 24 hour per day rehabilitation nursing, Therapeutic Recreaction (TR), Neuropsychology, Case Management (Social Worker), Rehabilitation Medicine, Nutrition Services and Pharmacy Services  Weekly team conferences will be held on Tuesdays to discuss your progress.  Your Social Worker will talk with you frequently to get your input and to update you on team discussions.  Team conferences with you and your family in attendance may also be held.  Expected length of stay: 7-10 days    Overall anticipated outcome: modified independent at wheelchair  Depending on your progress and recovery, your program may change. Your Social Worker will coordinate services and will keep you informed of any changes. Your Social Worker's name and contact numbers are listed  below.  The following services may also be recommended but are not provided by the [facility] will be made to provide these services after discharge if needed.  Arrangements include referral to agencies that provide these services.  Your insurance has been verified to be:  Medicare and Medicaid Your primary doctor is:  Tamsen Roers  Pertinent information will be shared with your  doctor and your insurance company.  Social Worker:  [place], [place] or [phone]   Information discussed with and copy given to patient by: Lennart Pall, 04/20/2018, 12:52 PM

## 2018-04-20 NOTE — Progress Notes (Signed)
Occupational Therapy Session Note  Patient Details  Name: [name] MRN: [id number] Date of Birth: 1966/04/21  Today's Date: 04/20/2018 OT Individual Time: 8882-8003 OT Individual Time Calculation (min): 73 min   Short Term Goals: Week 1:  OT Short Term Goal 1 (Week 1): LTg=STG  Skilled Therapeutic Interventions/Progress Updates:    Pt greeted in w/c. Reported pain but that she was premedicated. Tx focus on DME safety, activity tolerance, balance, and pt education during bathing, dressing, and grooming tasks. After gathering clothing items w/c level, pt completed bathing with supervision sit<stand at sink (while wearing Lt shoe). Dressing completed in similar manner afterwards, cued her to stand at sink vs bedside table. Pt exhibited no carryover of  w/c safety education provided during ADL. Once she completed grooming/oral care tasks with Mod I, she self propelled to dayroom. Educated her on use of LH cleaning supplies, including duster and dustpan. Pt practiced using of these items with instruction. We discussed using such items for IADL continuation at home. Afterwards pt self propelled back to room and was left with all needs within reach.   Pt reported she "threw" her chair alarm somewhere. Re-educated her to call for staff assist for OOB needs.   Therapy Documentation Precautions:  Precautions Precautions: Fall Precaution Comments: wound vac R; new R BKA Required Braces or Orthoses: Other Brace/Splint Other Brace/Splint: Limb guard for RLE Restrictions Weight Bearing Restrictions: Yes RLE Weight Bearing: Non weight bearing LLE Weight Bearing: Weight bearing as tolerated Pain: Pain Assessment Pain Scale: 0-10 Pain Score: 6  Pain Type: Acute pain;Surgical pain Pain Location: Leg Pain Orientation: Right Pain Descriptors / Indicators: Aching;Burning Pain Onset: On-going Patients Stated Pain Goal: 3 Pain Intervention(s): Medication (See eMAR) Multiple Pain Sites:  No ADL: ADL ADL Comments: see functional navigator :    See Function Navigator for Current Functional Status.   Therapy/Group: Individual Therapy  Brice Kossman A Yoandri Congrove 04/20/2018, 12:23 PM

## 2018-04-20 NOTE — Progress Notes (Signed)
This RN assisted patient to toilet at approximately 0830 this AM and informed the patient to call for assistance before getting back in wheelchair. Patient proceeded to return to wheelchair on her own. Patient educated on safety of transfers while on rehab and during therapy. Patient education on-going.

## 2018-04-20 NOTE — Significant Event (Signed)
WWas the fall witnessed:no  Patient condition before and after the fall: R AKA  Patient's reaction to the fall: hematoma to back of head pt still states she goes by herself and doesn't wait for help  Name of the doctor that was notified including date and time: 82 Dr Naaman Plummer  Any interventions and vital signs: Ice to head vs q30 minutes until CT neuro check Q1H times 2 tylenol for headache once returned from CT .

## 2018-04-20 NOTE — Progress Notes (Signed)
Occupational Therapy Session Note  Patient Details  Name: [name] MRN: [id number] Date of Birth: 01/15/1966  Today's Date: 04/20/2018 OT Individual Time: 1130-1200 OT Individual Time Calculation (min): 30 min    Short Term Goals: Week 1:  OT Short Term Goal 1 (Week 1): LTg=STG  Skilled Therapeutic Interventions/Progress Updates:    1:1 Pt very adamant about wanting to get into the 3 foot high truck at d/c and after d/c. Pt reports other [place] car might not be avable at d/c to get into to go home. Practiced. Hopping backwards with RW onto 6 inch step and then sit back on the seat (bypassing the running board).  Coming out of the car with foot on step squat pivoted into the w/c. Pt reports she has a step (a crate) she can use at home. Recommended still that pt use a lower vehicle which would be safer.  Pt did realize that this may not be the safest method.   Discussed this method with weekend PT who is willing to trial on real truck tomorrow with husband.   2nd session 1:1 Pt with small ramp into bedroom (sunken room with step) that husband for her to access room. Practiced ambulating up and down the ramp with min A at slow speed with cues for RW safety. Pt requested a Rollator for a seat; however told pt this is not recommended at this time due to not safe.  Recommended use of w/c for the community.   Therapy Documentation Precautions:  Precautions Precautions: Fall Precaution Comments: wound vac R; new R BKA Required Braces or Orthoses: Other Brace/Splint Other Brace/Splint: Limb guard for RLE Restrictions Weight Bearing Restrictions: Yes RLE Weight Bearing: Non weight bearing LLE Weight Bearing: Weight bearing as tolerated Pain: Pain Assessment Pain Score: 5  ADL: ADL ADL Comments: see functional navigator  See Function Navigator for Current Functional Status.   Therapy/Group: Individual Therapy  [name] [facility] 04/20/2018, 2:16 PM

## 2018-04-20 NOTE — Progress Notes (Signed)
Physical Therapy Note  Patient Details  Name: [name] MRN: [id number] Date of Birth: Jun 09, 1966 Today's Date: 04/20/2018    Time: 1400-1500 60 minutes  1:1 Pt c/o residual limb pain, RN made aware.  Pt perseverative on getting into her truck on d/c.  Practiced hopping backward up to 4'' step multiple attempts with min A.  Attempted hopping backward up 6'' step. Pt unable despite max A and multiple attempts.  Pt frustrated but states she has to use the truck.  UBE for UE strength and endurance 4 min fwd/4 min backward level 3.  Pt then attempts to transfer mod I w/c to bed.  PT performed planned failure and pt dropped to knees due to unsafe positioning of w/c and unsafe transfer technique.  Pt able to perform transfer to bed with mod/max A.  PT educated pt on risk of falls and importance of safety to prevent falls. Pt continues to be frustrated and c/o pain and fatigue.  Pt left in bed with needs at hand, alarm set.   Adolphe Fortunato 04/20/2018, 3:08 PM

## 2018-04-20 NOTE — Significant Event (Addendum)
Pt found on floor in between bathroom door facing into the room. Pt states " that ledge is dangerous you just go running down it." Pt was asked if she got up by herself and she said "yes I don't like waiting for you all to come".Pt but into bed with 2 man lift. Safety reviewed. Pt has hematoma to right posterior head, denies LOC, no step down noted to spine pupils perla. Pt also states hit her jaw. Ice applied to head pt instructed not to leave bed. Pt does c/o of headache there is no n/v , pupils are perla. Denies blurred vision or diplopia.there is a small pink/red area by upper mandible with minimal swelling. Pt's speech is clear. Alert and oriented X4. Pt is noncompliant with safety rules. Educated about risks associated with falls especially with hematomas/ concussions/ or possibility for more grievous injury. Pt told that door is to be kept open as part of safety program, but is adamant that the door be closed.  MAE's without difficulty. Stump dressing Clean Dry and Intact.

## 2018-04-21 ENCOUNTER — Ambulatory Visit (HOSPITAL_COMMUNITY): Payer: Self-pay | Admitting: Physical Therapy

## 2018-04-21 ENCOUNTER — Inpatient Hospital Stay (HOSPITAL_COMMUNITY): Payer: Self-pay

## 2018-04-21 ENCOUNTER — Inpatient Hospital Stay (HOSPITAL_COMMUNITY): Payer: Self-pay | Admitting: Occupational Therapy

## 2018-04-21 LAB — URINALYSIS, COMPLETE (UACMP) WITH MICROSCOPIC
BILIRUBIN URINE: NEGATIVE
Bacteria, UA: NONE SEEN
Hgb urine dipstick: NEGATIVE
KETONES UR: NEGATIVE mg/dL
Leukocytes, UA: NEGATIVE
NITRITE: NEGATIVE
PH: 6 (ref 5.0–8.0)
Protein, ur: NEGATIVE mg/dL
SPECIFIC GRAVITY, URINE: 1.005 (ref 1.005–1.030)

## 2018-04-21 LAB — GLUCOSE, CAPILLARY
GLUCOSE-CAPILLARY: 147 mg/dL — AB (ref 65–99)
Glucose-Capillary: 174 mg/dL — ABNORMAL HIGH (ref 65–99)
Glucose-Capillary: 263 mg/dL — ABNORMAL HIGH (ref 65–99)
Glucose-Capillary: 145 mg/dL — ABNORMAL HIGH (ref 65–99)

## 2018-04-21 MED ORDER — PANTOPRAZOLE SODIUM 40 MG PO TBEC
40.0000 mg | DELAYED_RELEASE_TABLET | Freq: Every day | ORAL | Status: DC
  Administered 2018-04-21 – 2018-04-26 (×6): 40 mg via ORAL
  Filled 2018-04-21 (×6): qty 1

## 2018-04-21 NOTE — Plan of Care (Signed)
PT is noncompliant with safety care plan Pt fell 04/20/18

## 2018-04-21 NOTE — Progress Notes (Signed)
Patient is refusing for door to be left open. Patient educated continues to refuse.
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
## 2018-04-21 NOTE — Progress Notes (Signed)
Occupational Therapy Session Note  Patient Details  Name: [name] MRN: [id number] Date of Birth: 02-12-1966  Today's Date: 04/21/2018 OT Individual Time: 0900-1015 OT Individual Time Calculation (min): 75 min    Short Term Goals: Week 1:  OT Short Term Goal 1 (Week 1): LTg=STG  Skilled Therapeutic Interventions/Progress Updates:    1:1. Pt reporting HA/pain in R residual limb upon entering room. RN aware and administers medication at end of session. Pt decline bathing and dressing this date as she did this yesterday. Pt brushes teeth in w/c at sink. Pt propels w/c to/from tx gym. OT fits pt with darco shoe and teached pt donning technique. Pt completes 1 sit to stand with Darco shoe and immediately says, "nope, not wearing that." OT educates on importance of offloading for healing of LLE, but pt states, "I hear you say that, but im Bucking." Pt completes 1x15 UE therex for BUE strengthening shoulder flex/ext, ab/adduct, int/ext rotation, horizontal ab/adduction, shoulder pres, chest press, elbow flex ext to improve BUE strength. Pt completes 3x30 ball toss chest pass and overhead pass for BUE endurace required for BADl/community mobility. Pt transfers back to bed using squat pivot with VC for hand placement on bed rails/arm rest. Exited session with pt seated EOB RN present and call light in reach  Therapy Documentation Precautions:  Precautions Precautions: Fall Precaution Comments: wound vac R; new R BKA Required Braces or Orthoses: Other Brace/Splint Other Brace/Splint: Limb guard for RLE Restrictions Weight Bearing Restrictions: Yes RLE Weight Bearing: Non weight bearing LLE Weight Bearing: Weight bearing as tolerated General:   Vital Signs: Therapy Vitals Temp: 99.2 F (37.3 C) Temp Source: Oral Pulse Rate: 95 Resp: 17 BP: 114/67 Patient Position (if appropriate): Lying Oxygen Therapy SpO2: 95 % O2 Device: Room Air   See Function Navigator for Current Functional  Status.   Therapy/Group: Individual Therapy  Tonny Branch 04/21/2018, 9:43 AM
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
## 2018-04-22 LAB — URINE CULTURE: Culture: <10000 — AB

## 2018-04-22 LAB — GLUCOSE, CAPILLARY
GLUCOSE-CAPILLARY: 283 mg/dL — AB (ref 65–99)
GLUCOSE-CAPILLARY: 200 mg/dL — AB (ref 65–99)
Glucose-Capillary: 199 mg/dL — ABNORMAL HIGH (ref 65–99)
Glucose-Capillary: 200 mg/dL — ABNORMAL HIGH (ref 65–99)

## 2018-04-22 MED ORDER — INSULIN ASPART 100 UNIT/ML ~~LOC~~ SOLN
7.0000 [IU] | Freq: Three times a day (TID) | SUBCUTANEOUS | Status: DC
  Administered 2018-04-22 – 2018-04-26 (×12): 7 [IU] via SUBCUTANEOUS

## 2018-04-22 NOTE — Progress Notes (Signed)
Physical Therapy Session Note  Patient Details  Name: [name] MRN: [id number] Date of Birth: Jan 09, 1966  Today's Date: 04/21/2018 PT Individual Time:1400-1500   60 min   Short Term Goals: Week 1:  PT Short Term Goal 1 (Week 1): = LTGs due to ELOS  Skilled Therapeutic Interventions/Progress Updates:   Pt received sitting in [facility] and agreeable to PT  PT transported pt to main entrance of hospital in [facility]. Car transfer training instucted by PT at access truck with ~3.48f-4ft seat height from ground. Pt's husband supplied drink crates to function as 2 steps up to trunk. Pt utilized RW for stand pivot transfer in front of steps. Ascent of 2 steps with RW and min assist from. Moderate cues from PT for increased use of BUE to push through RW to allow pt to place sound LE on step. Pt able to stabilize UE on door and grab bar once on second step to sit on edge of truck seat and rotate into truck. [facility] with min assist from PT and UE support on RW. Pt's husband instructed in providing assistance to access truck with min assist and how to cue pt in step management. PT educated pt on safety concerns with d/c plan to utilize truck and strongly encouraged pt to consider using car to d/c home rather than trunk to prevent additional complications with pt were to fall with attempt to access truck. Pt returned to room and performed stand pivot transfer to bed with supervision assist. Sit>supine completed without assist and left supine in bed with call bell in reach and all needs met.       Therapy Documentation Precautions:  Precautions Precautions: Fall Precaution Comments: wound vac R; new R BKA Required Braces or Orthoses: Other Brace/Splint Other Brace/Splint: Limb guard for RLE Restrictions Weight Bearing Restrictions: Yes RLE Weight Bearing: Non weight bearing LLE Weight Bearing: Weight bearing as tolerated Pain: 6/10 in residual limb.   See Function Navigator for  Current Functional Status.   Therapy/Group: Individual Therapy  ALorie Phenix4/28/2019, 6:09 AM

## 2018-04-22 NOTE — Progress Notes (Signed)
[facility] PHYSICAL MEDICINE & REHABILITATION     PROGRESS NOTE    Subjective/Complaints: No falls or mishaps overnight. Head still a little sore from Friday night fall. States that she needs to go home tomorrow because she has an appointment with her psychiatrist on Tuesday.    ROS: Patient denies fever, rash, sore throat, blurred vision, nausea, vomiting, diarrhea, cough, shortness of breath or chest pain, joint or back pain, headache, or mood change.    Objective: Vital Signs: Blood pressure (!) 101/58, pulse 93, temperature 99.4 F (37.4 C), temperature source Oral, resp. rate 18, height 5\' 5"  (1.651 m), weight 94.3 kg (207 lb 14.3 oz), SpO2 95 %. Ct Head Wo Contrast  Result Date: 04/20/2018 CLINICAL DATA:  Fall with headache. EXAM: CT HEAD WITHOUT CONTRAST TECHNIQUE: Contiguous axial images were obtained from the base of the skull through the vertex without intravenous contrast. COMPARISON:  Head CT 04/09/2018 FINDINGS: Brain: No mass lesion, intraparenchymal hemorrhage or extra-axial collection. No evidence of acute cortical infarct. Normal appearance of the brain parenchyma and extra axial spaces for age. Vascular: No hyperdense vessel or unexpected vascular calcification. Skull: Right parietal scalp hematoma.  No skull fracture. Sinuses/Orbits: No sinus fluid levels or advanced mucosal thickening. No mastoid effusion. Normal orbits. IMPRESSION: 1. No acute intracranial abnormality. 2. Right parietal scalp hematoma without skull fracture. Electronically Signed   By: Ulyses Jarred M.D.   On: 04/20/2018 23:02   No results for input(s): WBC, HGB, HCT, PLT in the last 72 hours. No results for input(s): NA, K, CL, GLUCOSE, BUN, CREATININE, CALCIUM in the last 72 hours.  Invalid input(s): CO CBG (last 3)  Recent Labs    04/21/18 1747 04/21/18 2152 04/22/18 0638  GLUCAP 147* 145* 199*    Wt Readings from Last 3 Encounters:  04/22/18 94.3 kg (207 lb 14.3 oz)  04/13/18 96 kg (211 lb  10.3 oz)  03/19/18 90.3 kg (199 lb)    Physical Exam:  Constitutional: No distress . Vital signs reviewed. HEENT: EOMI, oral membranes moist Neck: supple Cardiovascular: RRR without murmur. No JVD    Respiratory: CTA Bilaterally without wheezes or rales. Normal effort    GI: BS +, non-tender, non-distended   Musculoskeletal: R-BKA incision well approximated, minimal serosanguineous drainage.  Minimal erythema around incision Neurological: She isalertand oriented to person, place, and time.  She is at cognitive baseline.  Motor 5/5 UE.  Right lower extremity limited by pain somewhat Skin: Skin iswarmand dry. Callus on left first MTP, old wound without no change --- ongoing bruising, small area of bruising between 3rd/4th toes. Psychiatric: Slightly anxious    Assessment/Plan: 1. Functional deficits secondary to right BKA which require 3+ hours per day of interdisciplinary therapy in a comprehensive inpatient rehab setting. Physiatrist is providing close team supervision and 24 hour management of active medical problems listed below. Physiatrist and rehab team continue to assess barriers to discharge/monitor patient progress toward functional and medical goals.  Function:  Bathing Bathing position   Position: Wheelchair/chair at sink  Bathing parts Body parts bathed by patient: Right arm, Left arm, Chest, Abdomen, Front perineal area, Buttocks, Right upper leg, Left upper leg, Left lower leg Body parts bathed by helper: Back  Bathing assist Assist Level: Supervision or verbal cues, Set up   Set up : To obtain items  Upper Body Dressing/Undressing Upper body dressing   What is the patient wearing?: Pull over shirt/dress     Pull over shirt/dress - Perfomed by patient: Thread/unthread right  sleeve, Thread/unthread left sleeve, Put head through opening, Pull shirt over trunk          Upper body assist Assist Level: More than reasonable time   Set up : To obtain  clothing/put away  Lower Body Dressing/Undressing Lower body dressing   What is the patient wearing?: Shoes, Non-skid slipper socks, Pants, Underwear Underwear - Performed by patient: Thread/unthread right underwear leg, Thread/unthread left underwear leg, Pull underwear up/down   Pants- Performed by patient: Thread/unthread right pants leg, Thread/unthread left pants leg, Pull pants up/down   Non-skid slipper socks- Performed by patient: Don/doff left sock   Socks - Performed by patient: Don/doff left sock   Shoes - Performed by patient: Don/doff left shoe, Fasten left            Lower body assist Assist for lower body dressing: Supervision or verbal cues      Toileting Toileting   Toileting steps completed by patient: Adjust clothing prior to toileting, Performs perineal hygiene, Adjust clothing after toileting Toileting steps completed by helper: Adjust clothing prior to toileting, Adjust clothing after toileting(per Elby Beck, NT) Toileting Assistive Devices: Grab bar or rail  Toileting assist Assist level: Touching or steadying assistance (Pt.75%)   Transfers Chair/bed transfer   Chair/bed transfer method: Stand pivot Chair/bed transfer assist level: Supervision or verbal cues Chair/bed transfer assistive device: Medical sales representative     Max distance: 4 Assist level: Touching or steadying assistance (Pt > 75%)   Wheelchair   Type: Manual Max wheelchair distance: 171ft  Assist Level: Supervision or verbal cues  Cognition Comprehension Comprehension assist level: Follows complex conversation/direction with extra time/assistive device  Expression Expression assist level: Expresses basic 90% of the time/requires cueing < 10% of the time.  Social Interaction Social Interaction assist level: Interacts appropriately 90% of the time - Needs monitoring or encouragement for participation or interaction.  Problem Solving Problem solving assist level: Solves  basic 50 - 74% of the time/requires cueing 25 - 49% of the time  Memory Memory assist level: Recognizes or recalls 75 - 89% of the time/requires cueing 10 - 24% of the time   Medical Problem List and Plan: 1.Functional deficitssecondary to right BKA -continue therapies   -Patient continues to display decreased insight and awareness on multiple levels.   -Explained to patient that her discharge date is May 2.  Perhaps our team can help get her appointment rescheduled with psychiatry 2. DVT Prophylaxis/Anticoagulation: Pharmaceutical:Lovenox 3. Pain Management:oxycodone prn. gabapentin 4. 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   -have requested neuropsychology evaluation as well 5. Neuropsych: This patientiscapable of making decisions on herown behalf. 6. Skin/Wound Care:Monitor wound for healing. Routine pressure relief measures.   -DSD to stump  -will continue dry dressing/padding of left 1st mtp, darco shoe for transfers  -Local care to scalp  7. Fluids/Electrolytes/Nutrition:Monitor I/O.   - .    8.T2DM: Monitor BS ac/hs. Continue Lantus 24u daily. with SSI. Resumed meal coverage at 5 units tid ac.    -pt with extremely poor insight re: diabetes.   -Increase mealtime coverage to 7 units  9. Step agalactiae sepsis: I changed IV ampicillin to amoxicillin to complete course.  Ends on 4/28 (today) 10. Bipolar disorder/Personality disorder: On Seroquel at bedtime. 11. Leukocytosis: likely reactive---12-13k   -Urinalysis negative, urine culture pending  -  Wound appears stable.  LOS (Days) [place] EVALUATION WAS PERFORMED  Meredith Staggers, MD 04/22/2018 9:03 AM

## 2018-04-23 ENCOUNTER — Encounter (HOSPITAL_COMMUNITY): Payer: Self-pay | Admitting: Psychology

## 2018-04-23 ENCOUNTER — Inpatient Hospital Stay (HOSPITAL_COMMUNITY): Payer: Self-pay | Admitting: Physical Therapy

## 2018-04-23 ENCOUNTER — Inpatient Hospital Stay (HOSPITAL_COMMUNITY): Payer: Medicare Other | Admitting: Occupational Therapy

## 2018-04-23 ENCOUNTER — Inpatient Hospital Stay (HOSPITAL_COMMUNITY): Payer: Self-pay | Admitting: Occupational Therapy

## 2018-04-23 DIAGNOSIS — S88119D Complete traumatic amputation at level between knee and ankle, unspecified lower leg, subsequent encounter: Secondary | ICD-10-CM

## 2018-04-23 LAB — BASIC METABOLIC PANEL
ANION GAP: 9 (ref 5–15)
BUN: 9 mg/dL (ref 6–20)
CALCIUM: 8.9 mg/dL (ref 8.9–10.3)
CO2: 24 mmol/L (ref 22–32)
Chloride: 101 mmol/L (ref 101–111)
Creatinine, Ser: 0.7 mg/dL (ref 0.44–1.00)
GFR calc Af Amer: >60 mL/min (ref >60)
GLUCOSE: 238 mg/dL — AB (ref 65–99)
Potassium: 4.2 mmol/L (ref 3.5–5.1)
Sodium: 134 mmol/L — ABNORMAL LOW (ref 135–145)

## 2018-04-23 LAB — GLUCOSE, CAPILLARY
GLUCOSE-CAPILLARY: 180 mg/dL — AB (ref 65–99)
GLUCOSE-CAPILLARY: 177 mg/dL — AB (ref 65–99)
GLUCOSE-CAPILLARY: 130 mg/dL — AB (ref 65–99)
Glucose-Capillary: 175 mg/dL — ABNORMAL HIGH (ref 65–99)

## 2018-04-23 LAB — CBC
HCT: 34.2 % — ABNORMAL LOW (ref 36.0–46.0)
HEMOGLOBIN: 10.8 g/dL — AB (ref 12.0–15.0)
MCH: 29.8 pg (ref 26.0–34.0)
MCHC: 31.6 g/dL (ref 30.0–36.0)
MCV: 94.5 fL (ref 78.0–100.0)
Platelets: 340 10*3/uL (ref 150–400)
RBC: 3.62 MIL/uL — AB (ref 3.87–5.11)
RDW: 14 % (ref 11.5–15.5)
WBC: 16.2 10*3/uL — ABNORMAL HIGH (ref 4.0–10.5)

## 2018-04-23 MED ORDER — QUETIAPINE FUMARATE 300 MG PO TABS: 600.0000 mg | ORAL_TABLET | Freq: Every day | ORAL | Status: DC

## 2018-04-23 MED ORDER — ACETAMINOPHEN 325 MG PO TABS: 325.0000 mg | ORAL_TABLET | ORAL | Status: DC | PRN

## 2018-04-23 MED ORDER — INSULIN GLARGINE 100 UNIT/ML ~~LOC~~ SOLN
28.0000 [IU] | Freq: Every day | SUBCUTANEOUS | Status: DC
  Administered 2018-04-24 – 2018-04-26 (×3): 28 [IU] via SUBCUTANEOUS
  Filled 2018-04-23 (×3): qty 0.28

## 2018-04-23 NOTE — Progress Notes (Signed)
[facility] PHYSICAL MEDICINE & REHABILITATION     PROGRESS NOTE    Subjective/Complaints: Pt without new issues. Concerned about how she'll get her equipment at home  ROS: Patient denies fever, rash, sore throat, blurred vision, nausea, vomiting, diarrhea, cough, shortness of breath or chest pain, joint or back pain, headache, or mood change. .    Objective: Vital Signs: Blood pressure 123/75, pulse (!) 102, temperature 97.8 F (36.6 C), temperature source Oral, resp. rate 18, height 5\' 5"  (1.651 m), weight 97 kg (213 lb 13.5 oz), SpO2 94 %. No results found. Recent Labs    04/23/18 0513  WBC 16.2*  HGB 10.8*  HCT 34.2*  PLT 340   Recent Labs    04/23/18 0513  NA 134*  K 4.2  CL 101  GLUCOSE 238*  BUN 9  CREATININE 0.70  CALCIUM 8.9   CBG (last 3)  Recent Labs    04/22/18 1639 04/22/18 2102 04/23/18 0657  GLUCAP 283* 200* 180*    Wt Readings from Last 3 Encounters:  04/22/18 97 kg (213 lb 13.5 oz)  04/13/18 96 kg (211 lb 10.3 oz)  03/19/18 90.3 kg (199 lb)    Physical Exam:  Constitutional: No distress . Vital signs reviewed. HEENT: EOMI, oral membranes moist Neck: supple Cardiovascular: RRR without murmur. No JVD    Respiratory: CTA Bilaterally without wheezes or rales. Normal effort    GI: BS +, non-tender, non-distended Musculoskeletal: R-BKA incision well approximated, minimal serosanguineous drainage.  stable erythema around incision Neurological: She isalertand oriented to person, place, and time.  She is at cognitive baseline.  Motor 5/5 UE.  Right lower extremity limited by pain somewhat Skin: Skin iswarmand dry. Callus on left first MTP, old wound  - ongoing bruising, small area of bruising between 3rd/4th toes. Psychiatric: remains anxious    Assessment/Plan: 1. Functional deficits secondary to right BKA which require 3+ hours per day of interdisciplinary therapy in a comprehensive inpatient rehab setting. Physiatrist is providing  close team supervision and 24 hour management of active medical problems listed below. Physiatrist and rehab team continue to assess barriers to discharge/monitor patient progress toward functional and medical goals.  Function:  Bathing Bathing position   Position: Wheelchair/chair at sink  Bathing parts Body parts bathed by patient: Right arm, Left arm, Chest, Abdomen, Front perineal area, Buttocks, Right upper leg, Left upper leg, Left lower leg Body parts bathed by helper: Back  Bathing assist Assist Level: Supervision or verbal cues, Set up   Set up : To obtain items  Upper Body Dressing/Undressing Upper body dressing   What is the patient wearing?: Pull over shirt/dress     Pull over shirt/dress - Perfomed by patient: Thread/unthread right sleeve, Thread/unthread left sleeve, Put head through opening, Pull shirt over trunk          Upper body assist Assist Level: More than reasonable time   Set up : To obtain clothing/put away  Lower Body Dressing/Undressing Lower body dressing   What is the patient wearing?: Shoes, Non-skid slipper socks, Pants, Underwear Underwear - Performed by patient: Thread/unthread right underwear leg, Thread/unthread left underwear leg, Pull underwear up/down   Pants- Performed by patient: Thread/unthread right pants leg, Thread/unthread left pants leg, Pull pants up/down   Non-skid slipper socks- Performed by patient: Don/doff left sock   Socks - Performed by patient: Don/doff left sock   Shoes - Performed by patient: Don/doff left shoe, Fasten left  Lower body assist Assist for lower body dressing: Supervision or verbal cues      Toileting Toileting   Toileting steps completed by patient: Adjust clothing prior to toileting, Performs perineal hygiene, Adjust clothing after toileting Toileting steps completed by helper: Adjust clothing prior to toileting, Adjust clothing after toileting(per Elby Beck, NT) Toileting Assistive  Devices: Grab bar or rail  Toileting assist Assist level: Touching or steadying assistance (Pt.75%)   Transfers Chair/bed transfer   Chair/bed transfer method: Stand pivot Chair/bed transfer assist level: Supervision or verbal cues Chair/bed transfer assistive device: Medical sales representative     Max distance: 4 Assist level: Touching or steadying assistance (Pt > 75%)   Wheelchair   Type: Manual Max wheelchair distance: 127ft  Assist Level: Supervision or verbal cues  Cognition Comprehension Comprehension assist level: Follows complex conversation/direction with extra time/assistive device  Expression Expression assist level: Expresses basic 90% of the time/requires cueing < 10% of the time.  Social Interaction Social Interaction assist level: Interacts appropriately 90% of the time - Needs monitoring or encouragement for participation or interaction.  Problem Solving Problem solving assist level: Solves basic 50 - 74% of the time/requires cueing 25 - 49% of the time  Memory Memory assist level: Recognizes or recalls 75 - 89% of the time/requires cueing 10 - 24% of the time   Medical Problem List and Plan: 1.Functional deficitssecondary to right BKA -continue therapies   -Patient continues to display decreased insight and awareness on multiple levels.   -will ask our team to see if they can help get her appointment rescheduled with psychiatry 2. DVT Prophylaxis/Anticoagulation: Pharmaceutical:Lovenox 3. Pain Management:oxycodone prn. gabapentin 4. 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   -have requested neuropsychology evaluation as well 5. Neuropsych: This patientiscapable of making decisions on herown behalf. 6. Skin/Wound Care:Monitor wound for healing. Routine pressure relief measures.   -DSD to stump,  shrinker, guard  -will continue dry dressing/padding of left 1st mtp, darco shoe for transfers    7. Fluids/Electrolytes/Nutrition:Monitor I/O.   - .    8.T2DM: Monitor BS ac/hs.   SSI.      -pt with extremely poor insight re: diabetes.   -Increased mealtime coverage to 7 units on 4/29---expect ongoing elevation  -increase lantus to 28u daily  9. Step agalactiae sepsis: I changed IV ampicillin to amoxicillin to complete course.  Ends on 4/28 (today) 10. Bipolar disorder/Personality disorder: On Seroquel at bedtime. 11. Leukocytosis: likely reactive---12-13k   -Urinalysis negative, urine culture insigificant growth  -Wound appears stable.  LOS (Days) [place] EVALUATION WAS PERFORMED  Meredith Staggers, MD 04/23/2018 8:27 AM

## 2018-04-23 NOTE — Progress Notes (Addendum)
Occupational Therapy Session Note  Patient Details  Name: [name] MRN: [id number] Date of Birth: 01/19/1966  Today's Date: 04/23/2018 OT Individual Time: 26- 40 OT Individual Time Calculation (min): 45 min     Short Term Goals: Week 1:  OT Short Term Goal 1 (Week 1): LTg=STG  Skilled Therapeutic Interventions/Progress Updates:    Upon entering the room, pt seated in wheelchair with husband present in room. Pt is very restless in chair and appears to have increased anxiety. Pt stating, " This is normal for me." Pt requesting to engage in therapeutic exercise within room. OT educated and demonstrated use of 4 lbs weighted ball for B UE strengthening exercises. Pt performing 3 sets of 15 of bicep curls, forward rows, backwards rows, and chest presses with multiple rest breaks secondary to fatigue. Pt continues to discuss and morn limb loss during this session with OT providing therapeutic use of self. Pt remained in wheelchair at end of session with call bell and all needed items within reach. Lunch tray placed in front of pt as therapist exited the room.   Therapy Documentation Precautions:  Precautions Precautions: Fall Precaution Comments: wound vac R; new R BKA Required Braces or Orthoses: Other Brace/Splint Other Brace/Splint: Limb guard for RLE Restrictions Weight Bearing Restrictions: (P) Yes RLE Weight Bearing: Non weight bearing LLE Weight Bearing: Weight bearing as tolerated   Pain: Pain Assessment Pain Score: 6  Pain Type: Acute pain Pain Location: Leg Pain Orientation: Right Pain Descriptors / Indicators: Aching;Discomfort Pain Intervention(s): Medication (See eMAR) ADL: ADL ADL Comments: see functional navigator  See Function Navigator for Current Functional Status.   Therapy/Group: Individual Therapy  [name] 04/23/2018, 12:45 PM

## 2018-04-23 NOTE — Progress Notes (Signed)
Physical Therapy Note  Patient Details  Name: [name] MRN: [id number] Date of Birth: [birth year] Today's Date: 04/23/2018    Time: 1400-1505 65 minutes  1:1 Pt c/o pain in residual limb, pain meds given prior to session.  Pt performs w/c mobility throughout unit with supervision and increased time due to UE fatigue.  Pt requires supervision and cuing for parts management and safe set up for transfers.  Sit <> stand blocked practice for strengthening and endurance with RW with close supervision.  Hopping up 4'' step x 3 attempts with min A with RW.  Pt frustrated, stating "I can't hop up there on my own".  PT continues to encourage pt to have supervision for ALL mobility and to have physical assist for hopping up any steps.  Pt performs seated core strengthening with 2kg med ball, seated bilat LE therex for AAROM. UBE for UE strength and endurance 3 min fwd/3 min bkwd level 4.  Toilet transfer with min A, supervision and increased time for clothing management, min A for standing balance during clothing management and hygiene.  Pt performs squat pivot transfer to bed with close supervision.  Pt left with needs at hand, bed alarm set.  Keshaun Dubey 04/23/2018, 3:14 PM

## 2018-04-23 NOTE — Progress Notes (Signed)
Occupational Therapy Session Note  Patient Details  Name: [name] MRN: [id number] Date of Birth: 28-Apr-1966  Today's Date: 04/23/2018 OT Individual Time: 1696-7893 OT Individual Time Calculation (min): 72 min  Short Term Goals: Week 1:  OT Short Term Goal 1 (Week 1): LTg=STG  Skilled Therapeutic Interventions/Progress Updates:    Pt greeted post toileting with NT present. Reporting pain in residual limb with RN in to medicate. Tx focus on balance, safety awareness, activity tolerance, and pt education during bathing, dressing, and grooming tasks. Pt completed bathing/dressing in w/c at sit<stand level at sink. Supervision for DME safety with poor carryover. Pt completed grooming, oral care, and hair-drying while seated with setup/Mod I. Educated pt on figure 8 technique for wrapping residual limb as OT rewrapped limb. Pt with hands on practice during limb wrapping and would benefit from additional practice. Education regarding d/c ADL recommendations/safety continued throughout tx. She was left with all needs within reach. Per pt, if another chair alarm is set on her she's going to "throw it like I threw the last one!"   Therapy Documentation Precautions:  Precautions Precautions: Fall Precaution Comments: wound vac R; new R BKA Required Braces or Orthoses: Other Brace/Splint Other Brace/Splint: Limb guard for RLE Restrictions Weight Bearing Restrictions: (P) Yes RLE Weight Bearing: Non weight bearing LLE Weight Bearing: Weight bearing as tolerated Pain: In residual limb. RN in to medicate during session Pain Assessment Pain Score: 6  Pain Type: Acute pain Pain Location: Leg Pain Orientation: Right Pain Descriptors / Indicators: Aching;Discomfort Pain Intervention(s): Medication (See eMAR) ADL: ADL ADL Comments: see functional navigator     See Function Navigator for Current Functional Status.  Therapy/Group: Individual Therapy  [name] [name] 04/23/2018, 12:47  PM

## 2018-04-23 NOTE — Consult Note (Signed)
Neuropsychological Consultation   Patient:   [name]   DOB:   01-Apr-1966  MR Number:  [id number]  Location:  [facility] [id number] [place] [address] Dept: [phone] HBZ: [phone]           Date of Service:   04/23/2018  Start Time:   8 AM End Time:   9 AM  Provider/Observer:  Ilean Skill, Psy.D.       Clinical Neuropsychologist       Billing Code/Service: [id number] 4 Units  Chief Complaint:    [name] is a 52 year old female who has a history of COPD with ongoing tobacco use.  The patient reports that she smokes between 3 and 4 packs of cigarettes each day.  The patient is also been diagnosed with obstructive sleep apnea, poorly controlled diabetes as well as neuropathy and retinopathy.  The patient is also dealt with bilateral lower extremity edema and Charcot foot.  The patient was admitted on 04/09/2018 with weakness, mental status changes, and worsening of foot wound.  She was found to have strep sepsis and was started on IV antibiotics for treatment.  The patient's MRI of her foot revealed early osteomyelitis with abscess formation.  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  While the patient has been recommended to stop smoking and get better control of her diabetes, both of which are likely major factors in her ongoing difficulties and neuropathy the patient has been hesitant to be willing to address these issues.  She has been participating with her therapeutic interventions but the patient remains rather angry about some of the expectations that are needed to be done and is quick to be critical of others through her care.  However, the patient does acknowledge that she knows her smoking and dietary habits are playing a role in her significant ongoing medical issues including her BKA.  The patient denies  significant depressive symptoms at this time but does report that she has having trouble coping with the fact that she has had her leg amputated and initially verbalizes anger that she did not have more consultation before the surgery but in fact the patient was very sick and was deteriorating.  The patient does have a prior history of bipolar disorder and anxiety symptoms.  Currently, she denies that her symptoms with regard to her frustration and anger directly related to her bipolar disorder.  Reason for Service:  Destinae Neubecker was referred for neuropsychological consultation due to coping and adjustment issues.  Below is the HPI for the current admission.  [name] is a 52 year old failure female with history of COPD with ongoing tobacco use 3 packs a day,obstructive sleep apnea,poorly controlled diabetes mellitus with insensate neuropathy and retinopathy,bilateral lower extremity edema,Charcot foot;who was admitted via ED on 04/09/2018 with weakness mental status changes and worsening of foot wound. She was found to have strep agalactiaesepsis and was started on IV antibiotics for treatment.MRI of the foot revealed early osteomyelitis with with abscess formation and she underwent I&D of right foot wound without improvement in healing. Dr. 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 Therapy evaluations done and patient was noted to have functional deficits. CIR was recommended for follow-up therapy.  Current Status:  The patient is been having a  lot of frustration and anger coping with the loss of her leg.  The patient reports that she does realize that her life choices are likely primary factors of her amputation, she is angry and frustrated and this gets placed upon others.    Behavioral Observation: [name]  presents as a 52 y.o.-year-old Right Caucasian Female who  appeared her stated age. her dress was Appropriate and she was Well Groomed and her manners were Appropriate to the situation.  her participation was indicative of Appropriate and Attentive behaviors.  There were any physical disabilities noted.  she displayed an appropriate level of cooperation and motivation.     Interactions:    Active Appropriate and Attentive  Attention:   within normal limits and attention span and concentration were age appropriate  Memory:   within normal limits; recent and remote memory intact  Visuo-spatial:  within normal limits  Speech (Volume):  normal  Speech:   normal; normal  Thought Process:  Coherent and Relevant  Though Content:  WNL; not suicidal and not homicidal  Orientation:   person, place, time/date and situation  Judgment:   Fair  Planning:   Fair  Affect:    Angry and Irritable  Mood:    Angry and Anxious  Insight:   Fair  Intelligence:   normal  Medical History:   Past Medical History:  Diagnosis Date  . Anxiety   . Arthritis   . Bipolar disorder ([facility])   . Broken jaw ([place])   . Broken wrist   . Chronic back pain   . Claudication ([place])    a. 12/2013 ABI's: R 0.97, L 0.94.  [name] COPD (chronic obstructive pulmonary disease) ([place])   . Depression   . Diabetic Charcot's foot ([place])   . Diabetic foot ulcer ([facility])    chronic left foot ulcer , great toe/  hx recurrent foot ulcer bilaterally  . DJD (degenerative joint disease)   . Edema of both lower extremities   . Episode of memory loss   . GERD (gastroesophageal reflux disease)   . Headache(784.0)   . Hiatal hernia   . History of colon cancer, stage I dx 02-26-2015--- oncologist-- dr [name]--- per last note no recurrence   05-16-2015 s/p  Laparoscopic sigmoid colectomy w/ node bx's (negative nodes per path)  Stage I (pT1,N0,M0) Grade 2  . 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   per MRI multiple small strokes post cardiac cath. --  residual mild  memory loss  . History of methicillin resistant staphylococcus aureus (MRSA)   . Hypercholesterolemia   . Insomnia   . Insulin dependent type 2 diabetes mellitus, uncontrolled ([facility]) dx 2004   endocrinologist-  dr Dwyane Dee-- last A1c 8.8 in Aug2018:  pt is noncompliant w/ diet, states does note eat breakfast , her first main meal in afternoon  . Neurogenic bladder   . Neuropathy, diabetic ([place])    hands and feet  . OSA (obstructive sleep apnea)    cpap intolerant  . Personality disorder ([facility])   . SOB (shortness of breath) on exertion   . Stroke ([facility])   . SUI (stress urinary incontinence, female) S/P SLING 12-29-2011   Psychiatric History:  The patient has been diagnosed with bipolar disorder, personality disorder and anxiety in the past.  There are likely some long-standing issues with frustration, interpersonal conflicts and anger etc.  The patient reports that some of her frustration are related to her long-term mood issues.  Family Med/Psych History:  Family History  Problem Relation Age of Onset  . Hypertension Mother   . Diabetes Mother   . Cancer - Other Mother        lymphoma   . Cancer - Other Father        lung, bladder cancer   . Heart attack Father   . Cancer - Other Brother        bladder cancer     Risk of Suicide/Violence: low the patient denies any suicidal or homicidal ideation.  Impression/DX:  Kenley Rettinger is a 52 year old female who has a history of COPD with ongoing tobacco use.  The patient reports that she smokes between 3 and 4 packs of cigarettes each day.  The patient is also been diagnosed with obstructive sleep apnea, poorly controlled diabetes as well as neuropathy and retinopathy.  The patient is also dealt with bilateral lower extremity edema and Charcot foot.  The patient was admitted on 04/09/2018 with weakness, mental status changes, and worsening of foot wound.  She was found to have strep sepsis and was started on IV antibiotics for treatment.  The  patient's MRI of her foot revealed early osteomyelitis with abscess formation.  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  While the patient has been recommended to stop smoking and get better control of her diabetes, both of which are likely major factors in her ongoing difficulties and neuropathy the patient has been hesitant to be willing to address these issues.  She has been participating with her therapeutic interventions but the patient remains rather angry about some of the expectations that are needed to be done and is quick to be critical of others through her care.  However, the patient does acknowledge that she knows her smoking and dietary habits are playing a role in her significant ongoing medical issues including her BKA.  The patient denies significant depressive symptoms at this time but does report that she has having trouble coping with the fact that she has had her leg amputated and initially verbalizes anger that she did not have more consultation before the surgery but in fact the patient was very sick and was deteriorating.  The patient does have a prior history of bipolar disorder and anxiety symptoms.  Currently, she denies that her symptoms with regard to her frustration and anger directly related to her bipolar disorder.  The patient is been having a lot of frustration and anger coping with the loss of her leg.  The patient reports that she does realize that her life choices are likely primary factors of her amputation, she is angry and frustrated and this gets placed upon others.   Disposition/Plan:  The patient will be discharged soon.  However, we did work on Therapist, occupational and strategies around adjusting to the sudden loss of her leg.  The patient acknowledges that she is very difficult to change and she feels like without her continued smoking that her anxiety will get out of control.  It is very likely  that she has been aggressively self-medicating her anxiety and irritation agitation by essentially chain smoking for years.  Diagnosis:   Right leg below the knee amputation.  `   Prior diagnosis of personality disorder unspecified, bipolar disorder and anxiety.        Electronically Signed   _______________________ Ilean Skill, Psy.D.

## 2018-04-23 NOTE — Progress Notes (Signed)
Brief podiatry Note  Subjective:  Patient met at bedside and was noted to be eating lunch. Patient admits to phantom pains on right. Patient is s/p R BKA and reports that she is not coping well with the loss of her leg. Patient denies any other acute issues  Objective: Focused exam Right ace wrap clean dry and intact to BKA site To Left foot dry scab sub met 1 with no signs of infection  Assessment and Plan:  Diabetic with neuropathy and preulcerative lesion left sub met 1 and is s/p R BKA  -Patient seen and evaluated  -Recommend continue with dry dressing to left foot and custom shoe; Patient due to imbalance can not use wedge post op shoe can only use flat post op shoe on left -Continue with BKA care on right with Surgeon of Record -Patient to follow up in my office within 2 weeks after discharge for her left foot and for her new custom shoe for the left   Dr. Landis Martins, [place] [facility] [place] [phone]

## 2018-04-23 NOTE — Progress Notes (Signed)
[facility] Progress Note Patient Details:  [name] 10-22-1966 [id number]  Patient ID: [name], female   DOB: 1966/11/24, 53 y.o.   MRN: [id number]   [name] 04/23/2018, 12:41 PMCalled [employer] for brace adjustment.

## 2018-04-24 ENCOUNTER — Inpatient Hospital Stay (HOSPITAL_COMMUNITY): Payer: Self-pay | Admitting: Physical Therapy

## 2018-04-24 ENCOUNTER — Inpatient Hospital Stay (HOSPITAL_COMMUNITY): Payer: Medicare Other | Admitting: Occupational Therapy

## 2018-04-24 ENCOUNTER — Encounter: Payer: Medicare Other | Admitting: Sports Medicine

## 2018-04-24 DIAGNOSIS — Z91199 Patient's noncompliance with other medical treatment and regimen due to unspecified reason: Secondary | ICD-10-CM

## 2018-04-24 DIAGNOSIS — D72829 Elevated white blood cell count, unspecified: Secondary | ICD-10-CM

## 2018-04-24 DIAGNOSIS — Z9181 History of falling: Secondary | ICD-10-CM | POA: Insufficient documentation

## 2018-04-24 DIAGNOSIS — Z9119 Patient's noncompliance with other medical treatment and regimen: Secondary | ICD-10-CM

## 2018-04-24 LAB — GLUCOSE, CAPILLARY
GLUCOSE-CAPILLARY: 137 mg/dL — AB (ref 65–99)
GLUCOSE-CAPILLARY: 227 mg/dL — AB (ref 65–99)
Glucose-Capillary: 160 mg/dL — ABNORMAL HIGH (ref 65–99)
Glucose-Capillary: 120 mg/dL — ABNORMAL HIGH (ref 65–99)

## 2018-04-24 LAB — CBC
HEMATOCRIT: 33.9 % — AB (ref 36.0–46.0)
Hemoglobin: 10.9 g/dL — ABNORMAL LOW (ref 12.0–15.0)
MCH: 29.9 pg (ref 26.0–34.0)
MCHC: 32.2 g/dL (ref 30.0–36.0)
MCV: 93.1 fL (ref 78.0–100.0)
Platelets: 325 10*3/uL (ref 150–400)
RBC: 3.64 MIL/uL — AB (ref 3.87–5.11)
RDW: 13.8 % (ref 11.5–15.5)
WBC: 11.2 10*3/uL — ABNORMAL HIGH (ref 4.0–10.5)

## 2018-04-24 MED ORDER — LIVING WELL WITH DIABETES BOOK
Freq: Once | Status: AC
  Administered 2018-04-24: 08:00:00
  Filled 2018-04-24: qty 1

## 2018-04-24 NOTE — Discharge Summary (Signed)
Physician Discharge Summary  Patient ID: NIYATI HEINKE MRN: [id number] DOB/AGE: 1966/12/05 52 y.o.  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 EXAM: CT HEAD WITHOUT CONTRAST TECHNIQUE: Contiguous axial images were obtained from the base of the skull through the vertex without intravenous contrast. COMPARISON:  Head CT 04/09/2018 FINDINGS: Brain: No mass lesion, intraparenchymal hemorrhage or extra-axial collection. No evidence of acute cortical infarct. Normal appearance of the brain parenchyma and extra axial spaces for age. Vascular: No hyperdense vessel or unexpected vascular calcification. Skull: Right parietal scalp hematoma.  No skull fracture. Sinuses/Orbits: No sinus fluid levels or advanced mucosal thickening. No mastoid effusion. Normal orbits. IMPRESSION: 1. No acute intracranial abnormality. 2. Right parietal scalp hematoma without skull fracture. Electronically Signed   By: Ulyses Jarred M.D.   On: 04/20/2018 23:02   Ct Head Wo Contrast  Result Date: 04/09/2018 CLINICAL DATA:  Altered LOC EXAM: CT HEAD WITHOUT CONTRAST TECHNIQUE: Contiguous axial images were obtained from the base of the skull through the vertex without intravenous contrast. COMPARISON:  09/09/2017 head CT FINDINGS: Brain: Mild motion degradation. No acute territorial infarction, hemorrhage or intracranial mass. Normal ventricle size. Vascular: No hyperdense vessel or unexpected calcification. Skull: Normal. Negative for fracture or focal lesion.  Sinuses/Orbits: No acute finding. Other: None IMPRESSION: Negative non contrasted CT appearance of the brain Electronically Signed   By: Donavan Foil M.D.   On: 04/09/2018 19:23   Mr Brain Wo Contrast  Result Date: 04/10/2018 CLINICAL DATA:  Altered mental status EXAM: MRI HEAD WITHOUT CONTRAST TECHNIQUE: Multiplanar, multiecho pulse sequences of the brain and surrounding structures were obtained without intravenous contrast. COMPARISON:  Head CT 04/09/2018 FINDINGS: The examination had to be discontinued prior to completion due to patient confusion and inability to cooperate with the technologist's instructions. Axial and coronal diffusion-weighted imaging with corresponding ADC maps, sagittal T1-weighted imaging, axial T2 weighted and FLAIR sequences were obtained. The study is degraded by motion, despite efforts to reduce this artifact, including utilization of motion-resistant MR sequences. The findings of the study are interpreted in the context of reduced sensitivity/specificity. BRAIN: The midline structures are normal. There is no acute infarct or acute hemorrhage. No mass lesion, hydrocephalus, dural abnormality or extra-axial collection. The brain parenchymal signal is normal for the patient's age. No age-advanced or lobar predominant atrophy. No chronic microhemorrhage or superficial siderosis. VASCULAR: Major intracranial arterial and venous sinus flow voids are preserved. SKULL AND UPPER CERVICAL SPINE: The visualized skull base, calvarium, upper cervical spine and extracranial soft tissues are normal. SINUSES/ORBITS: No fluid levels or advanced mucosal thickening. No mastoid or middle ear effusion. Normal orbits. IMPRESSION: Motion degraded and truncated MRI of the brain without visible abnormality. Electronically Signed   By: Ulyses Jarred M.D.   On: 04/10/2018 17:28    Labs:  Basic Metabolic Panel: BMP Latest Ref Rng & Units 04/23/2018 04/17/2018 04/16/2018  Glucose 65 - 99 mg/dL 238(H)  121(H) -  BUN 6 - 20 mg/dL 9 5(L) -  Creatinine 0.44 - 1.00 mg/dL 0.70 0.57 0.51  Sodium 135 - 145 mmol/L 134(L) 138 -  Potassium 3.5 - 5.1 mmol/L 4.2 3.7 -  Chloride 101 - 111 mmol/L 101 103 -  CO2 22 - 32 mmol/L 24 28 -  Calcium 8.9 - 10.3 mg/dL 8.9 8.2(L) -    CBC: CBC Latest Ref Rng & Units 04/26/2018 04/24/2018 04/23/2018  WBC 4.0 - 10.5 K/uL 9.1 11.2(H) 16.2(H)  Hemoglobin 12.0 - 15.0 g/dL 11.0(L) 10.9(L) 10.8(L)  Hematocrit 36.0 - 46.0 % 34.5(L) 33.9(L) 34.2(L)  Platelets 150 - 400 K/uL 333 325 340    CBG: Recent Labs  Lab 04/25/18 0637 04/25/18 1130 04/25/18 1627 04/25/18 2116 04/26/18 0634  GLUCAP 110* 108* 178* 91 131*    Brief HPI:   [name] is a 52 year old female with history of COPD with ongoing tobacco use, OSA, poorly controlled T2DM with insensate neuropathy and retinopathy, bilateral lower extremity edema, Charcot foot; who was admitted via ED on 04/09/2018 with weakness, mental status changes and worsening of right foot wound. She was found to have strep agalactiae sepsis and was started on IV antibiotics for treatment.  MRI of the foot revealed early osteomyelitis with abscess formation and she underwent I&D of right foot wound without improvement in healing.  Dr. Sharol Given was consulted for input and she underwent right BKA on 4/19.  He recommends tobacco cessation as well as better diabetes control.  Therapy evaluations revealed functional deficits and CIR was recommended for follow-up therapy   Hospital Course: TAZIAH DIFATTA was admitted to rehab 04/16/2018 for inpatient therapies to consist of PT and OT at least three hours five days a week. Past admission physiatrist, therapy team and rehab RN have worked together to provide customized collaborative inpatient rehab.  Right amputation site is healing well without signs or symptoms of infection or drainage.  Staples remain intact some pressure and mild irritation noted around along staple site. IV antibiotics were  changed to ampicillin per hospitalist input and she completed 14 day course on 4/28.   Her diabetes has been monitored with ac/hs CBG checks.  Blood sugars have been were poorly controlled and Lantus has been titrated upwards with improvement in BS control. Patient's husband advised to resume Basaglar at 28 units with Novolog 5 units tid ac for BS > 120 at meals. She is to follow up with Dr. Dwyane Dee for further input as needed.  She has required encouragement to wear her limb guard as well as multiple caution about safety with mobility.  A was fitted with a medium size limb guard protector for better fit. Education has been good ongoing daily in regards to her dietary restrictions, safety for fall prevention as well as tobacco cessation. She was found on the floor with hematoma to posterior scalp and CT head done on 4/27.   She has had occasional bouts of anxiety, PTSD symptoms, acute on chronic insomnia and neuropathy. Nortriptyline was increased to 25 mg at bedtime and low dose Xanax was added to help mood stabilization. Xanax was changed back to  Klonopin prior to discharge.  Dr. Sima Matas (/Neuropsychology) was consulted for assistance and has educated patient on building coping skills and strategies to adjust with limb loss. Patient reported to using smoking as coping skill to manage her anxiety and she was seen smoking . Her counselor has followed up with her during her stay and patient to follow up with psychiatry on Tuesday for further titration of medications. Dr. Cannon Kettle has followed up on patient and recommended continuing dry dressing to scab on left foot with post op shoe.  She will continue to receive follow up HHPT, [place] and HHRN by [facility] after discharage.     Rehab course: During patient's stay in rehab weekly team conferences were held to monitor patient's progress, set goals and discuss barriers to discharge. At admission, patient required min to light mod assist with mobility  and min assist with basic self care tasks.  She  has had improvement in activity tolerance, balance, postural control as well as ability to compensate for deficits.  She is able to complete ADL tasks at modified independent level from wheelchair. She requires supervision with transfers and is able to ambulate 25' with supervision. Family education was completed regarding all aspects of safety and care.    Disposition: 01-Home or Self Care   Diet: Diabetic/Heart healthy.   Special Instructions: 1. Needs supervision with all activity. Wear limb guard when out of bed.  2. Absolutely no smoking. 3. Follow up with Dr. Dwyane Dee for further adjustment of diabetic regimen.    Discharge Instructions    Ambulatory referral to Physical Medicine Rehab   Complete by:  As directed    1-2 weeks transitional care appt     Allergies as of 04/26/2018      Reactions   Latex Itching, Rash, Other (See Comments)   Pt states she cannot use condoms - cause an infection.  Use of latex on skin is okay.  Tape causes rash   Sweetening Enhancer [flavoring Agent] Nausea And Vomiting, Other (See Comments)   HEADACHES   Aspartame And Phenylalanine Nausea And Vomiting   HEADACHES   Ibuprofen Other (See Comments)   HEADACHES   Trazodone And Nefazodone Other (See Comments)   Hallucinations   Triazolam Other (See Comments)   HALLUCINATIONS      Medication List    STOP taking these medications   amoxicillin 500 MG capsule Commonly known as:  AMOXIL   INGREZZA 80 MG Caps Generic drug:  Valbenazine Tosylate   insulin aspart 100 UNIT/ML injection Commonly known as:  novoLOG   tamsulosin 0.4 MG Caps capsule Commonly known as:  FLOMAX   VRAYLAR 4.5 MG Caps Generic drug:  Cariprazine HCl   zolpidem 5 MG tablet Commonly known as:  AMBIEN     TAKE these medications   acetaminophen 325 MG tablet Commonly known as:  TYLENOL Take 1-2 tablets (325-650 mg total) by mouth every 4 (four) hours as needed for mild  pain. What changed:    when to take this  reasons to take this   bisacodyl 5 MG EC tablet Commonly known as:  DULCOLAX Take 1 tablet (5 mg total) by mouth daily as needed for moderate constipation.   clonazePAM 0.5 MG tablet Commonly known as:  KLONOPIN Take 1 tablet (0.5 mg total) by mouth 3 (three) times daily as needed (anxiety).   docusate sodium 100 MG capsule Commonly known as:  COLACE Take 1 capsule (100 mg total) by mouth 2 (two) times daily.   gabapentin 600 MG tablet Commonly known as:  NEURONTIN TAKE 1 TABLET (600 MG TOTAL) BY MOUTH 2 (TWO) TIMES DAILY.   glucose blood test strip Commonly known as:  FREESTYLE LITE Use to check blood sugars up to 4 times daily. Dx Code E11.65   insulin glargine 100 UNIT/ML injection Commonly known as:  LANTUS Inject 0.28 mLs (28 Units total) into the skin daily before breakfast. What changed:  how much to take   lidocaine 5 % Commonly known as:  LIDODERM Place 1 patch onto  the skin daily as needed (pain). Remove & Discard patch within 12 hours or as directed by MD   Melatonin 10 MG Caps Take 10 mg by mouth at bedtime.   methocarbamol 500 MG tablet Commonly known as:  ROBAXIN Take 1 tablet (500 mg total) by mouth every 6 (six) hours as needed for muscle spasms.   nicotine 21 mg/24hr patch Commonly known as:  NICODERM CQ - dosed in mg/24 hours Place 1 patch (21 mg total) onto the skin daily.   nortriptyline 25 MG capsule Commonly known as:  PAMELOR Take 1 capsule (25 mg total) by mouth at bedtime. What changed:    medication strength  how much to take  how to take this  when to take this  additional instructions   ondansetron 4 MG tablet Commonly known as:  ZOFRAN Take 1 tablet (4 mg total) by mouth every 6 (six) hours as needed for nausea.   Oxycodone HCl 10 MG Tabs--Rx # 120 pills  Take 1 tablet (10 mg total) by mouth every 6 (six) hours as needed for moderate pain (pain score 4-6). What changed:     medication strength  how much to take  when to take this   pantoprazole 40 MG tablet Commonly known as:  PROTONIX Take 1 tablet (40 mg total) by mouth daily. Start taking on:  04/27/2018   QUEtiapine 300 MG tablet Commonly known as:  SEROQUEL Take 2 tablets (600 mg total) by mouth at bedtime. What changed:    medication strength  how much to take   rOPINIRole 2 MG tablet Commonly known as:  REQUIP Take 1 tablet (2 mg total) by mouth daily.      Follow-up Information    Meredith Staggers, MD Follow up.   Specialty:  Physical Medicine and Rehabilitation Why:  Office will call you with follow up appointment Contact information: [address] [place] [address] [phone]        Noemi Chapel, [place]. Go on 04/30/2018.   Why:  appointment at 11:45 am Contact information: [address] [place] [address] [phone]        Tamsen Roers, MD Follow up on 05/01/2018.   Specialty:  Family Medicine Why:  @ 11:30 am (hospital follow up appointment) Contact information: [address] [address] [phone]        Newt Minion, MD. Call on 05/03/2018.   Specialty:  Orthopedic Surgery Why:  Be there at 9:30 am for 9:45 am follow up appointment Contact information: [address] [place] [address] [phone]           Signed: Bary Leriche 04/26/2018, 4:38 PM

## 2018-04-24 NOTE — Progress Notes (Signed)
[facility]     PROGRESS NOTE    Subjective/Complaints: No new issues overnight. Anxious to get home  ROS: Patient denies fever, rash, sore throat, blurred vision, nausea, vomiting, diarrhea, cough, shortness of breath or chest pain, joint or back pain, headache, or mood change. .    Objective: Vital Signs: Blood pressure 134/72, pulse 96, temperature 97.7 F (36.5 C), temperature source Oral, resp. rate 18, height 5\' 5"  (1.651 m), weight 92.3 kg (203 lb 7.8 oz), SpO2 94 %. No results found. Recent Labs    04/23/18 0513 04/24/18 0512  WBC 16.2* 11.2*  HGB 10.8* 10.9*  HCT 34.2* 33.9*  PLT 340 325   Recent Labs    04/23/18 0513  NA 134*  K 4.2  CL 101  GLUCOSE 238*  BUN 9  CREATININE 0.70  CALCIUM 8.9   CBG (last 3)  Recent Labs    04/23/18 1634 04/23/18 2131 04/24/18 0624  GLUCAP 177* 130* 160*    Wt Readings from Last 3 Encounters:  04/24/18 92.3 kg (203 lb 7.8 oz)  04/13/18 96 kg (211 lb 10.3 oz)  03/19/18 90.3 kg (199 lb)    Physical Exam:  Constitutional: No distress . Vital signs reviewed. HEENT: EOMI, oral membranes moist Neck: supple Cardiovascular: RRR without murmur. No JVD    Respiratory: CTA Bilaterally without wheezes or rales. Normal effort    GI: BS +, non-tender, non-distended   Musculoskeletal:stump tender Neurological: She isalertand oriented to person, place, and time.  She is at cognitive baseline.  Motor 5/5 UE.  Right lower extremity limited by pain somewhat Skin: Skin iswarmand dry. Callus on left first MTP, old wound  - ongoing bruising, small area of bruising between 3rd/4th toes.      Psychiatric: less anxious    Assessment/Plan: 1. Functional deficits secondary to right BKA which require 3+ hours per day of interdisciplinary therapy in a comprehensive inpatient rehab setting. Physiatrist is providing close team supervision and 24 hour management of active medical problems listed  below. Physiatrist and rehab team continue to assess barriers to discharge/monitor patient progress toward functional and medical goals.  Function:  Bathing Bathing position   Position: Wheelchair/chair at sink  Bathing parts Body parts bathed by patient: Right arm, Left arm, Chest, Abdomen, Front perineal area, Buttocks, Right upper leg, Left upper leg, Left lower leg Body parts bathed by helper: Back  Bathing assist Assist Level: Supervision or verbal cues, Set up   Set up : To obtain items  Upper Body Dressing/Undressing Upper body dressing   What is the patient wearing?: Pull over shirt/dress     Pull over shirt/dress - Perfomed by patient: Thread/unthread right sleeve, Thread/unthread left sleeve, Put head through opening, Pull shirt over trunk          Upper body assist Assist Level: More than reasonable time   Set up : To obtain clothing/put away  Lower Body Dressing/Undressing Lower body dressing   What is the patient wearing?: Shoes, Non-skid slipper socks, Pants, Underwear Underwear - Performed by patient: Thread/unthread right underwear leg, Thread/unthread left underwear leg, Pull underwear up/down   Pants- Performed by patient: Thread/unthread right pants leg, Thread/unthread left pants leg, Pull pants up/down   Non-skid slipper socks- Performed by patient: Don/doff left sock   Socks - Performed by patient: Don/doff left sock   Shoes - Performed by patient: Don/doff left shoe, Fasten left            Lower body  assist Assist for lower body dressing: Supervision or verbal cues      Toileting Toileting   Toileting steps completed by patient: Adjust clothing prior to toileting, Performs perineal hygiene, Adjust clothing after toileting Toileting steps completed by helper: Adjust clothing prior to toileting, Adjust clothing after toileting(per Elby Beck, NT) Toileting Assistive Devices: Grab bar or rail  Toileting assist Assist level: Touching or steadying  assistance (Pt.75%)   Transfers Chair/bed transfer   Chair/bed transfer method: Stand pivot Chair/bed transfer assist level: Supervision or verbal cues Chair/bed transfer assistive device: Medical sales representative     Max distance: 4 Assist level: Touching or steadying assistance (Pt > 75%)   Wheelchair   Type: Manual Max wheelchair distance: 141ft  Assist Level: Supervision or verbal cues  Cognition Comprehension Comprehension assist level: Follows complex conversation/direction with extra time/assistive device  Expression Expression assist level: Expresses basic 90% of the time/requires cueing < 10% of the time.  Social Interaction Social Interaction assist level: Interacts appropriately with others with medication or extra time (anti-anxiety, antidepressant).  Problem Solving Problem solving assist level: Solves basic 75 - 89% of the time/requires cueing 10 - 24% of the time  Memory Memory assist level: Recognizes or recalls 90% of the time/requires cueing < 10% of the time   Medical Problem List and Plan: 1.Functional deficitssecondary to right BKA -continue therapies   -Patient continues to display decreased insight and awareness on multiple levels.   -team conf today 2. DVT Prophylaxis/Anticoagulation: Pharmaceutical:Lovenox 3. Pain Management:oxycodone prn. gabapentin 4. 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   -appreciate neuropsychology evaluation   5. Neuropsych: This patientiscapable of making decisions on herown behalf. 6. Skin/Wound Care:Monitor wound for healing. Routine pressure relief measures.   -DSD to stump, shrinker, guard---area appears stable  - continue dry dressing/padding of left 1st mtp, darco shoe for transfers    7. Fluids/Electrolytes/Nutrition:Monitor I/O.   - .    8.T2DM:  Monitor BS ac/hs.   SSI.      -pt with extremely poor insight re: diabetes.   -Increased mealtime coverage to 7 units on 4/29---expect ongoing elevation  -increased lantus to 28u daily  -improved control over last 24h  9. Step agalactiae sepsis: I changed IV ampicillin to amoxicillin to complete course.  Ends on 4/28 (today) 10. Bipolar disorder/Personality disorder: On Seroquel at bedtime. 11. Leukocytosis:     -Urinalysis negative, urine culture insigificant growth  -Wound appears stable.   -wbc's down to 11.2  LOS (Days) 8 A FACE TO FACE EVALUATION WAS PERFORMED  Meredith Staggers, MD 04/24/2018 8:30 AM

## 2018-04-24 NOTE — Progress Notes (Addendum)
Physical Therapy Note  Patient Details  Name: [name] MRN: [id number] Date of Birth: Sep 23, 1966 Today's Date: 04/24/2018    Time: 545-6256 38 minutes  1:1 Pt states her residual limb is "sore", states she rec'd pain meds prior to session.  Pt performs w/c mobility throughout unit with supervision.  Blocked practice for w/c parts management of don/doff leg rests and arm rests for safe set up for transfers, pt improves throughout session progressing from supervision to mod I.  Supine therex for LE strengthening 2 x 15 quad set, SLR, hip abd/add, sound leg bridge, LEs on theraball for hip flex/ext and LTR.  Core strengthening PNFs with 2kg med ball.  Transfer training to toilet, w/c, mat and bed with supervision.  nustep x 10 minutes for UE and Lt LE strengthening level 5.  Pt given HEP handout of supine therex.  Pt left in room with needs at hand, alarm set.   Time 2: 1300-1341 41 minutes  1:1 Pt states her residual limb feels "better".  Pt performs squat pivot transfers with close supervision to w/c.  Pt performs w/c mobility throughout unit with mod I in controlled environment, supervision in home environment.  Supervision/occasional min A for blocked practice of ramp negotiation as pt has ramp down to bedroom at home.  Pt left in bed with needs at hand, alarm set.  Miner Koral 04/24/2018, 10:54 AM

## 2018-04-24 NOTE — Plan of Care (Signed)
Pt noncompliant with diabetic diet drinks regular soda and eats outside food Pt wants to be awoken from sleep for pain medication- states pain 6-8

## 2018-04-24 NOTE — Progress Notes (Signed)
Occupational Therapy Session Note  Patient Details  Name: [name] MRN: [id number] Date of Birth: 12/23/1966  Today's Date: 04/24/2018 OT Individual Time: 1950-9326 OT Individual Time Calculation (min): 58 min    Short Term Goals: Week 1:  OT Short Term Goal 1 (Week 1): LTg=STG  Skilled Therapeutic Interventions/Progress Updates:    Upon entering the room, pt supine in bed and reports feeling overwhelmed that her husband is in the hospital as well. Pt agreeable to OT intervention this session with pt performing self care from seated position on EOB with supervision/set up from EOB. Pt performing lateral leans to remove LB clothing and for peri cleansing with supervision. Pt donning UB clothing at overall mod I level. Pt declined to go to sink for grooming task but was agreeable with set up while seated on EOB. Pt returned to supine at end of session with bed alarm activated and call bell within reach.   Therapy Documentation Precautions:  Precautions Precautions: Fall Precaution Comments: wound vac R; new R BKA Required Braces or Orthoses: Other Brace/Splint Other Brace/Splint: Limb guard for RLE Restrictions Weight Bearing Restrictions: Yes RLE Weight Bearing: Non weight bearing LLE Weight Bearing: Weight bearing as tolerated Pain: Pain Assessment Pain Scale: 0-10 Pain Score: 4  Pain Location: Leg Pain Orientation: Right Pain Descriptors / Indicators: Aching Patients Stated Pain Goal: 4 Pain Intervention(s): Medication (See eMAR);Repositioned ADL: ADL ADL Comments: see functional navigator  See Function Navigator for Current Functional Status.   Therapy/Group: Individual Therapy  [name] 04/24/2018, 11:22 AM

## 2018-04-25 ENCOUNTER — Inpatient Hospital Stay (HOSPITAL_COMMUNITY): Payer: Medicare Other | Admitting: Occupational Therapy

## 2018-04-25 ENCOUNTER — Inpatient Hospital Stay (HOSPITAL_COMMUNITY): Payer: Self-pay | Admitting: Occupational Therapy

## 2018-04-25 ENCOUNTER — Inpatient Hospital Stay (HOSPITAL_COMMUNITY): Payer: Self-pay | Admitting: Physical Therapy

## 2018-04-25 LAB — URINALYSIS, ROUTINE W REFLEX MICROSCOPIC
Bilirubin Urine: NEGATIVE
Glucose, UA: NEGATIVE mg/dL
Hgb urine dipstick: NEGATIVE
Ketones, ur: NEGATIVE mg/dL
NITRITE: NEGATIVE
PH: 6 (ref 5.0–8.0)
Protein, ur: NEGATIVE mg/dL
SPECIFIC GRAVITY, URINE: 1.004 — AB (ref 1.005–1.030)

## 2018-04-25 LAB — GLUCOSE, CAPILLARY
GLUCOSE-CAPILLARY: 110 mg/dL — AB (ref 65–99)
GLUCOSE-CAPILLARY: 91 mg/dL (ref 65–99)
Glucose-Capillary: 108 mg/dL — ABNORMAL HIGH (ref 65–99)
Glucose-Capillary: 178 mg/dL — ABNORMAL HIGH (ref 65–99)

## 2018-04-25 MED ORDER — OXYCODONE HCL 5 MG PO TABS
10.0000 mg | ORAL_TABLET | ORAL | Status: DC | PRN
Start: 2018-04-25 — End: 2018-04-26
  Administered 2018-04-25 – 2018-04-26 (×5): 10 mg via ORAL
  Filled 2018-04-25 (×5): qty 2

## 2018-04-25 MED ORDER — CLONAZEPAM 0.5 MG PO TABS
0.5000 mg | ORAL_TABLET | Freq: Three times a day (TID) | ORAL | Status: DC | PRN
  Administered 2018-04-25: 0.5 mg via ORAL
  Filled 2018-04-25 (×3): qty 1

## 2018-04-25 NOTE — Progress Notes (Signed)
Physical Therapy Discharge Summary  Patient Details  Name: [name] MRN: [id number] Date of Birth: Jul 06, 1966  Today's Date: 04/25/2018 PT Individual Time: 1005-1100   55 min    Patient has met 8 of 8 long term goals due to improved activity tolerance, improved balance, increased strength, increased range of motion, decreased pain and ability to compensate for deficits.  Patient to discharge at a wheelchair level Modified Independent.   Patient's care partner is independent to provide the necessary physical assistance at discharge.  Reasons goals not met: All PT goals met   Recommendation:  Patient will benefit from ongoing skilled PT services in home health setting to continue to advance safe functional mobility, address ongoing impairments in safety, gait, balance, strength, and minimize fall risk.  Equipment: RW and WC  Reasons for discharge: treatment goals met and discharge from hospital  Patient/family agrees with progress made and goals achieved: Yes    PT treatment: PT instructed pt in Grad day assessment to measure progress toward goals. See below for details. Pt returned to room at end of grad day assessment and left sitting in [facility] with call bell in reach and all needs met.   PT Discharge   Pain Pain Assessment Pain Scale: 0-10 Pain Score: 4  Pain Type: Acute pain;Surgical pain Pain Location: Leg Pain Orientation: Right Pain Descriptors / Indicators: Aching Pain Frequency: Constant Pain Onset: On-going Patients Stated Pain Goal: 3 Pain Intervention(s): Medication (See eMAR)(oxycodone 45m po) Vision/Perception     Cognition Overall Cognitive Status: Within Functional Limits for tasks assessed Orientation Level: Oriented X4 Sustained Attention: Appears intact Selective Attention: Appears intact Memory: Appears intact Awareness: Appears intact Problem Solving: Appears intact Behaviors: Impulsive Safety/Judgment: Impaired Comments: mild impulsivity at  baseline Sensation Sensation Light Touch: Impaired Detail Light Touch Impaired Details: Impaired RLE;Impaired LLE Proprioception: Impaired Detail Proprioception Impaired Details: Impaired LLE Additional Comments: phantom pain and sensations in the R LE  Coordination Gross Motor Movements are Fluid and Coordinated: Yes Fine Motor Movements are Fluid and Coordinated: Yes Motor  Motor Motor: Other (comment) Motor - Skilled Clinical Observations: generalized weakness  Mobility Bed Mobility Bed Mobility: Rolling Right;Rolling Left;Supine to Sit;Sit to Supine Rolling Right: 6: Modified independent (Device/Increase time) Rolling Left: 6: Modified independent (Device/Increase time) Supine to Sit: 6: Modified independent (Device/Increase time) Sit to Supine: 6: Modified independent (Device/Increase time) Transfers Transfers: Yes Sit to Stand: 5: Supervision Sit to Stand Details: Verbal cues for precautions/safety Stand Pivot Transfers: 5: Supervision Stand Pivot Transfer Details: Verbal cues for precautions/safety Squat Pivot Transfers: 5: Supervision Squat Pivot Transfer Details: Verbal cues for precautions/safety Locomotion  Ambulation Ambulation: Yes Ambulation/Gait Assistance: 5: Supervision Ambulation Distance (Feet): 25 Feet Assistive device: Rolling walker Ambulation/Gait Assistance Details: Verbal cues for gait pattern;Verbal cues for safe use of DME/AE Gait Gait: Yes Gait Pattern: Step-to pattern Wheelchair Mobility Wheelchair Mobility: Yes Wheelchair Assistance: 6: Modified independent (Device/Increase time) WEnvironmental health practitioner Both upper extremities Wheelchair Parts Management: Independent Distance: 2024f  Trunk/Postural Assessment  Cervical Assessment Cervical Assessment: Within Functional Limits Thoracic Assessment Thoracic Assessment: Exceptions to WFL(mild kyphotic ) Lumbar Assessment Lumbar Assessment: Exceptions to WFL(posterior pelvic tilt) Postural  Control Postural Control: Within Functional Limits  Balance Static Sitting Balance Static Sitting - Level of Assistance: 6: Modified independent (Device/Increase time) Dynamic Sitting Balance Dynamic Sitting - Level of Assistance: 6: Modified independent (Device/Increase time) Static Standing Balance Static Standing - Level of Assistance: 6: Modified independent (Device/Increase time)(with 2 UE support ) Dynamic Standing Balance Dynamic Standing -  Level of Assistance: 5: Stand by assistance(with 1-2 UE support ) Extremity Assessment      RLE Assessment RLE Assessment: Exceptions to [facility] RLE Strength RLE Overall Strength Comments: R BKA; 4/5 in hip at least 3/5 in knee. pain limiting LLE Assessment LLE Assessment: Within Functional Limits(Grossly 4+/5 proximal to distal except ankle DF 4/5 with pain in medial ankle and foot. )   See Function Navigator for Current Functional Status.  Lorie Phenix 04/25/2018, 10:50 AM

## 2018-04-25 NOTE — Progress Notes (Signed)
Patient educated on safety precautions and following safety protocol while in IP Rehab. Patient required telesitter monitoring due to multiple falls during this hospitalization and noncompliance to safety protocol. Telesitter rep reported patient turned montior system backwards and was not able to obtain visual of the patient. Asked patient why she turned monitor and patient reported because she was "tired of them watching her all the time". Reviewed with patient that she was not performing good safety judgements and not calling staff for assistance prior to transferring in bed or bathroom. Patient reports she will have to do it at home and she was going to do it in here. Reviewed ,again; safety protocol in place for her until she called for assistance and made better safety judgements while in hospital. Patient did not comment. Continue to utilize telemonitor system and chair/bed alarms. Educated patient on skin care and wound and incision care. Patient's husband unable to come in for education. Patient became very irritated with RN discussing need to educate her husband. Reported to patient need to educate her and her husband. Patient still not calling for assist despite call bell within reach and toileting every 4 hours performed. Educated on meal choices and better compliance managing diabetes. Patient not receptive to education.  Continue with plan of care. Mliss Sax

## 2018-04-25 NOTE — Progress Notes (Signed)
Patient continues to get up without assist . Patient instructed to call for assist prior to getting up and bathroom needs, etc. Patient reports she "can get up by herself and she ain't gonna have nobody to help her do everything at home". Educated patient to allow staff to supervise her transfers due to her falling multiple times in the hospital. Patient irritable at this time. Emotional support provided. Educated patient on residual limb wrapping and patient watched you tube video on stump wrapping for BKA. Patient's husband to come in around 3-4 for education on dressing changes.  Continue with plan of care.  Mliss Sax

## 2018-04-25 NOTE — Progress Notes (Signed)
Recreational Therapy Session Note  Patient Details  Name: [name] MRN: [id number] Date of Birth: [birth year] Today's Date: 04/25/2018  Pain: no c/o Skilled Therapeutic Interventions/Progress Updates:  Pt requested assistance in looking up information about emotional support animals (ESA).  Pt propelled w/c using BUEs with supervision.  Once in the dayroom, assisted pt with locating & printing information about ESA and how to become registered.  Gave pt written information/resources to share with family.  Pt appreciative of information.  Therapy/Group: Individual Therapy   [name] 04/25/2018, 3:48 PM

## 2018-04-25 NOTE — Progress Notes (Signed)
Occupational Therapy Session Note  Patient Details  Name: [name] MRN: [id number] Date of Birth: 1966-01-23  Today's Date: 04/25/2018 OT Individual Time: 1300-1330 OT Individual Time Calculation (min): 30 min    Short Term Goals: Week 1:  OT Short Term Goal 1 (Week 1): LTg=STG  Skilled Therapeutic Interventions/Progress Updates:    1:1 continued education on residual limb care and desensitization  techniques. RN asked to continue to review how to ace wrap limb for swelling and shaping of limb- found shrinker in room - discussed with PA and changed method from ace wrapping to wearing shrinker. Left pt resting in bed.  Therapy Documentation Precautions:  Precautions Precautions: Fall Precaution Comments: wound vac R; new R BKA Required Braces or Orthoses: Other Brace/Splint Other Brace/Splint: Limb guard for RLE Restrictions Weight Bearing Restrictions: Yes RLE Weight Bearing: Non weight bearing LLE Weight Bearing: Weight bearing as tolerated Pain: Pain Assessment Pain Scale: 0-10 Pain Score: 7  Pain Type: Acute pain;Surgical pain Pain Location: Leg Pain Orientation: Right Pain Descriptors / Indicators: Aching Pain Frequency: Constant Pain Onset: On-going Patients Stated Pain Goal: 3 Pain Intervention(s): Medication (See eMAR)(oxycodone 10 mg po) ADL: ADL ADL Comments: see functional navigator  See Function Navigator for Current Functional Status.   Therapy/Group: Individual Therapy  [name] [facility] 04/25/2018, 2:59 PM

## 2018-04-25 NOTE — Patient Care Conference (Signed)
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 At high risk for falls 04/24/2018  . Noncompliance with safety precautions 04/24/2018  . Leucocytosis 04/24/2018  . 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 PAD (peripheral artery disease) ([place])   . Poorly controlled type 2 diabetes mellitus ([place])   . Cutaneous abscess of right foot   . Subacute osteomyelitis, right ankle and foot ([place])   . Major depressive disorder, recurrent episode ([place]) 04/11/2018  . Diabetic ulcer of right midfoot associated with type 2 diabetes mellitus, with necrosis of bone ([place])   . Abscess of ankle   . Infectious synovitis   . Diabetic foot infection ([place])   . Severe sepsis ([place]) 04/09/2018  . Cancer of sigmoid colon ([place]) 03/09/2015  . OSA (obstructive sleep apnea) 07/04/2014  . Migraine without aura, with intractable migraine, so stated, without mention of status migrainosus 03/26/2014  . Peripheral neuropathy 03/03/2014  . Abnormal stress test 02/12/2014  . CVA (cerebral infarction) 02/12/2014  . Chest pain   . Abnormal heart rhythm   . Edema   . Hypertension   . Hyperlipemia   . Hypercholesterolemia   . Other and unspecified hyperlipidemia 11/14/2013  . Type II diabetes mellitus, uncontrolled ([place]) 07/22/2013  . Essential hypertension 07/22/2013  . Type II or unspecified type diabetes mellitus with neurological manifestations, uncontrolled 07/22/2013   . Diabetic polyneuropathy ([place]) 07/22/2013    Expected Discharge Date: Expected Discharge Date: 04/26/18  Team Members Present: Physician leading conference: Dr. Alger Simons Social Worker Present: Lennart Pall, LCSW Nurse Present: Benjie Karvonen, RN PT Present: Roderic Ovens, PT OT Present: [name], OT SLP Present: Weston Anna, SLP PPS Coordinator present : Daiva Nakayama, RN, CRRN     Current Status/Progress Goal Weekly Team Focus  Medical   Patient with fall over the weekend.  Sugars remain difficult to control.  Very poor insight.  Anxiety and PTSD issues  Improved safety awareness  Wound care, diabetes, behavior   Bowel/Bladder   continent of b/b LBM 04/29  maintain b/b continence with min assist  monitor b/b status q shift normal bowel function   Swallow/Nutrition/ Hydration             ADL's   Supervision overall. Dismissive of all OT education   mod I wheelchair level. Shower transfer to be goal d/c as home shower is not w/c accessible  pt education, ADL retraining, functional transfers, balance   Mobility   close supervision, occasional min A due to safety  supervision w/c level  family ed, d/c planning   Communication             Safety/Cognition/ Behavioral Observations            Pain   severe pain R BKA relieved with oxy 15 mg q 4 h prn prn tylenol and robaxin  pain <= 3/10  assess pain q shift  and prn notify MD for unrelieved pain   Skin   Right stump staples gauze dressing,  Left foot with diabetic ulcer gauze dressing. L great toe cracking ,   maintain skin integrity with min assist no s/sx of infection  monitor and treat skin q shift and prn    Rehab Goals Patient on target to meet rehab goals: Yes *See Care Plan and progress notes for long and short-term goals.     Barriers to Discharge  Current Status/Progress Possible Resolutions Date Resolved   Physician    Medical stability;Behavior        Continued education of patient regarding her  amputation and diabetes/risk factors      Nursing                  PT                    OT                  SLP                SW                Discharge Planning/Teaching Needs:  Plan home with s/o providing any needed assistance.  Spouse needs to be educated on dressing changes   Team Discussion:  Continues to be very non-compliant with diet and health hygeine overall.  Reaching a supervision level and on track to d/c 5/2.  Husband with recent hospitalization but home now?  Revisions to Treatment Plan:  None    Continued Need for Acute Rehabilitation Level of Care: The patient requires daily medical management by a physician with specialized training in physical medicine and rehabilitation for the following conditions: Daily direction of a multidisciplinary physical rehabilitation program to ensure safe treatment while eliciting the highest outcome that is of practical value to the patient.: Yes Daily medical management of patient stability for increased activity during participation in an intensive rehabilitation regime.: Yes Daily analysis of laboratory values and/or radiology reports with any subsequent need for medication adjustment of medical intervention for : Wound care problems;Diabetes problems;Post surgical problems  Londin Antone 04/25/2018, 4:18 PM

## 2018-04-25 NOTE — Progress Notes (Signed)
Social Work Patient ID: [name], female   DOB: Apr 27, 1966, 52 y.o.   MRN: [id number]    [name]  Social Worker  General Practice  Patient Care Conference  Signed  Date of Service:  04/25/2018  4:18 PM          Signed          Show:Clear all [x] Manual[x] Template[] Copied  Added by: [x] Lowella Curb, LCSW   [] Hover for details   Inpatient RehabilitationTeam Conference and Plan of Care Update Date: 04/24/2018   Time: 2:35 PM      Patient Name: [name]      Medical Record Number: [id number]  Date of Birth: [birth year] Sex: Female         Room/Bed: 4W01C/4W01C-01 Payor Info: Payor: MEDICARE / Plan: MEDICARE PART A AND B / Product Type: *No Product type* /     Admitting Diagnosis: r bka  Admit Date/Time:  04/16/2018  6:39 PM Admission Comments: No comment available    Primary Diagnosis:  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 At high risk for falls 04/24/2018  . Noncompliance with safety precautions 04/24/2018  . Leucocytosis 04/24/2018  . 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 PAD (peripheral artery disease) ([place])    . Poorly controlled type 2 diabetes mellitus ([place])    . Cutaneous abscess of right foot    . Subacute osteomyelitis, right ankle and foot ([place])    . Major depressive disorder, recurrent episode ([place]) 04/11/2018  . Diabetic ulcer of right midfoot associated with type 2 diabetes mellitus, with necrosis of bone ([place])    . Abscess of ankle    . Infectious synovitis    . Diabetic foot infection ([place])    . Severe sepsis ([place]) 04/09/2018  . Cancer of sigmoid colon ([place]) 03/09/2015  . OSA (obstructive sleep apnea) 07/04/2014  . Migraine without aura, with intractable migraine, so stated, without mention of status migrainosus 03/26/2014  . Peripheral neuropathy 03/03/2014  . Abnormal stress test 02/12/2014  . CVA (cerebral  infarction) 02/12/2014  . Chest pain    . Abnormal heart rhythm    . Edema    . Hypertension    . Hyperlipemia    . Hypercholesterolemia    . Other and unspecified hyperlipidemia 11/14/2013  . Type II diabetes mellitus, uncontrolled ([place]) 07/22/2013  . Essential hypertension 07/22/2013  . 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 Diabetic polyneuropathy ([place]) 07/22/2013      Expected Discharge Date: Expected Discharge Date: 04/26/18   Team Members Present: Physician leading conference: Dr. Alger Simons Social Worker Present: Lennart Pall, LCSW Nurse Present: Benjie Karvonen, RN PT Present: Roderic Ovens, PT OT Present: [name], OT SLP Present: Weston Anna, SLP PPS Coordinator present : Daiva Nakayama, RN, CRRN       Current Status/Progress Goal Weekly Team Focus  Medical     Patient with fall over the weekend.  Sugars remain difficult to control.  Very poor insight.  Anxiety and PTSD issues  Improved safety awareness  Wound care, diabetes, behavior   Bowel/Bladder     continent of b/b LBM 04/29  maintain b/b continence with min assist  monitor b/b status q shift normal bowel function   Swallow/Nutrition/ Hydration  ADL's     Supervision overall. Dismissive of all OT education   mod I wheelchair level. Shower transfer to be goal d/c as home shower is not w/c accessible  pt education, ADL retraining, functional transfers, balance   Mobility     close supervision, occasional min A due to safety  supervision w/c level  family ed, d/c planning   Communication               Safety/Cognition/ Behavioral Observations             Pain     severe pain R BKA relieved with oxy 15 mg q 4 h prn prn tylenol and robaxin  pain <= 3/10  assess pain q shift and prn notify MD for unrelieved pain   Skin     Right stump staples gauze dressing,  Left foot with diabetic ulcer gauze dressing. L great toe  cracking ,   maintain skin integrity with min assist no s/sx of infection  monitor and treat skin q shift and prn     Rehab Goals Patient on target to meet rehab goals: Yes *See Care Plan and progress notes for long and short-term goals.      Barriers to Discharge   Current Status/Progress Possible Resolutions Date Resolved   Physician     Medical stability;Behavior        Continued education of patient regarding her amputation and diabetes/risk factors      Nursing                 PT                    OT                 SLP            SW              Discharge Planning/Teaching Needs:  Plan home with s/o providing any needed assistance.  Spouse needs to be educated on dressing changes   Team Discussion:  Continues to be very non-compliant with diet and health hygeine overall.  Reaching a supervision level and on track to d/c 5/2.  Husband with recent hospitalization but home now?  Revisions to Treatment Plan:  None    Continued Need for Acute Rehabilitation Level of Care: The patient requires daily medical management by a physician with specialized training in physical medicine and rehabilitation for the following conditions: Daily direction of a multidisciplinary physical rehabilitation program to ensure safe treatment while eliciting the highest outcome that is of practical value to the patient.: Yes Daily medical management of patient stability for increased activity during participation in an intensive rehabilitation regime.: Yes Daily analysis of laboratory values and/or radiology reports with any subsequent need for medication adjustment of medical intervention for : Wound care problems;Diabetes problems;Post surgical problems   [name] 04/25/2018, 4:18 PM                  Lowella Curb, LCSW  Social Worker  General Practice  Patient Care Conference  Signed  Date of Service:  04/18/2018  2:35 PM          Signed          Show:Clear  all [x] Manual[x] Template[] Copied  Added by: [x] Lowella Curb, LCSW   [] Hover for details   Inpatient RehabilitationTeam Conference and Plan of Care Update Date: 04/17/2018   Time: 2:45 PM  Patient Name: [name]      Medical Record Number: [id number]  Date of Birth: [birth year] Sex: Female         Room/Bed: 4W01C/4W01C-01 Payor Info: Payor: MEDICARE / Plan: MEDICARE PART A AND B / Product Type: *No Product type* /     Admitting Diagnosis: r bka  Admit Date/Time:  04/16/2018  6:39 PM Admission Comments: No comment available    Primary Diagnosis:  <principal problem not specified> Principal Problem: <principal problem not specified>       Patient Active Problem List    Diagnosis Date Noted  . 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 PAD (peripheral artery disease) ([place])    . Poorly controlled type 2 diabetes mellitus ([place])    . Cutaneous abscess of right foot    . Subacute osteomyelitis, right ankle and foot ([place])    . Major depressive disorder, recurrent episode ([place]) 04/11/2018  . Diabetic ulcer of right midfoot associated with type 2 diabetes mellitus, with necrosis of bone ([place])    . Abscess of ankle    . Infectious synovitis    . Diabetic foot infection ([place])    . Severe sepsis ([place]) 04/09/2018  . Cancer of sigmoid colon ([place]) 03/09/2015  . OSA (obstructive sleep apnea) 07/04/2014  . Migraine without aura, with intractable migraine, so stated, without mention of status migrainosus 03/26/2014  . Peripheral neuropathy 03/03/2014  . Abnormal stress test 02/12/2014  . CVA (cerebral infarction) 02/12/2014  . Chest pain    . Abnormal heart rhythm    . Edema    . Hypertension    . Hyperlipemia    . Hypercholesterolemia    . Other and unspecified hyperlipidemia 11/14/2013  . Type II diabetes mellitus, uncontrolled ([place]) 07/22/2013  . Essential hypertension 07/22/2013  . Type II or unspecified type diabetes mellitus with neurological manifestations,  uncontrolled 07/22/2013  . Diabetic polyneuropathy ([place]) 07/22/2013      Expected Discharge Date: Expected Discharge Date: 04/26/18   Team Members Present: Physician leading conference: Dr. Alger Simons Social Worker Present: Lennart Pall, LCSW Nurse Present: Junius Creamer, RN PT Present: Roderic Ovens, PT OT Present: Benay Pillow, OT SLP Present: Weston Anna, SLP PPS Coordinator present : Daiva Nakayama, RN, CRRN       Current Status/Progress Goal Weekly Team Focus  Medical     right bka, bipolar, diabetes with poor compliance. pain control issues  improve functional mobility  wound care/vac, diabetes ed, pain mgt   Bowel/Bladder     continent of b/b, LBM 4/22. voiding large volumes 500-733ml q2-3 hours.  maintain b/b with mod I assist  montitor b/b status q shift and prn, toilet q 3 hours while awake   Swallow/Nutrition/ Hydration               ADL's     Min A overall  mod I wheelchair level and supervision for shower transfer  self care retraining, functional transfers, balance, pt/family edu   Mobility     min A  supervision/mod I w/c level  strength, balance, gait   Communication               Safety/Cognition/ Behavioral Observations             Pain     c/o severe pain to R BKA, oxy 15mg  q 4 hours prn, pt requests med q 2-3 hours,  prn tylenol and robaxin  maintain pain <4  monitor and treat pain as needed   Skin  wound vac to right stump, scant drainage, perineal/anal area with rash treated with nystatin, pt states it is much better than on admission to hospital, left foot with previous diabetic ulcer, by great toe on plantar surface and also cracking or diabetic ulcer noted to area on left foot between 3rd and 4th toe.  maintain skin with min assist  monitor and treat skin q shift and prn as ordered     Rehab Goals Patient on target to meet rehab goals: Yes *See Care Plan and progress notes for long and short-term goals.      Barriers to Discharge    Current Status/Progress Possible Resolutions Date Resolved   Physician     Medical stability;Wound Care        continued medical mgt as per chart      Nursing   IV antibiotics;Wound Care             PT                    OT                 SLP            SW              Discharge Planning/Teaching Needs:  Plan home with s/o providing any needed assistance.  TBD   Team Discussion:  New eval;  BKA, h/o bipolar;  DM but non-compliant with diet.  Voiding lg amounts of urine and refuses scans - MD aware.  Anticipate she may be non-compliant ongoing after d/c.  Min to supervision on eval with mod ind w/c level goals.  Revisions to Treatment Plan:  None    Continued Need for Acute Rehabilitation Level of Care: The patient requires daily medical management by a physician with specialized training in physical medicine and rehabilitation for the following conditions: Daily direction of a multidisciplinary physical rehabilitation program to ensure safe treatment while eliciting the highest outcome that is of practical value to the patient.: Yes Daily medical management of patient stability for increased activity during participation in an intensive rehabilitation regime.: Yes Daily analysis of laboratory values and/or radiology reports with any subsequent need for medication adjustment of medical intervention for : Post surgical problems;Wound care problems   Kerilyn Cortner 04/18/2018, 2:35 PM

## 2018-04-25 NOTE — Progress Notes (Signed)
Social Work Patient ID: [name], female   DOB: 1966-03-22, 52 y.o.   MRN: [id number]   Have reviewed team conference with pt and she is ready for d/c tomorrow.  Husband at home now.  Still need to complete dressing change education with him.  Arranging DME and HH follow up.  Sybil Shrader, LCSW

## 2018-04-25 NOTE — Progress Notes (Signed)
Pt is being medicated with  Her PRN Robaxin Q6H , tylenol 650mg  Q4 H, and, Oxycodone IR 15mg  Q4H. Pt asking to be woken up to be given her medicine "on time".. Pt told that if she is sleeping the assumption is that she is comfortable.. Reassured her that I would come in to do care when her medication is due so that she can have it if she needs it at that time. Pt will wake up every two hours asking for pain medication forgetting that she had been medicated only a few hours before and is not due. Pt also is asking for regular coke and multiple packs of graham crackers. Pt was given one coke this evening with 2 packs of graham crackers. Pt was reeducated regarding diabetes and diet.  Pt was told that she could not have another regular coke this morning at the time of this writing.

## 2018-04-25 NOTE — Progress Notes (Signed)
Occupational Therapy Discharge Summary  Patient Details  Name: [name] MRN: [id number] Date of Birth: Feb 13, 1966  Today's Date: 04/25/2018 OT Individual Time: 0705- 1017 and 1535-1605 OT Individual Time Calculation (min):70 min and 30 min    Patient has met 7 of 7 long term goals due to improved activity tolerance, improved balance and ability to compensate for deficits.  Patient to discharge at overall mod I from wheelchair level and supervision with dynamic standing balance level. Pt's husband not present for family education.  Reasons goals not met: all goals met  Recommendation:  Patient will benefit from ongoing skilled OT services in home health setting to continue to advance functional skills in the area of BADL and iADL.  Equipment:  3 in 1 commode chair  Reasons for discharge: treatment goals met  Patient/family agrees with progress made and goals achieved: Yes   OT intervention: Session 1: Upon entering the room, pt supine in bed and reports pain in residual limb. RN notified. OT assisted pt with washing hair at sink from wheelchair level. Pt engaged in bathing and dressing tasks from wheelchair level at sink with overall mod I. Pt can pull pants over B hips from EOB but requires supervision for safety if standing at sink with shoe on. OT recommended pt perform task from EOB at home or husband must be present and possibly hands on if at sink. Pt verbalized understanding. Pt performed all grooming tasks from wheelchair level at mod I. RN arrived with medications at end of session. Wheelchair alarm activated and call bell within reach.   Session 2: PT received in day room and transitioning easily from recreational therapy session. Pt agreeable to OT intervention with no c/o pain this session. OT provided pt with paper handout of B UE HEP with use of orange theraband. OT demonstrated exercises and pt returned demonstrations without cues needed for proper technique. Pt needing  multiple rest breaks with repetitions. Pt propelled wheelchair to room with increased time at mod I level. Chair alarm activated and call bell within all needed items within reach upon exiting the room.   OT Discharge Precautions/Restrictions  Precautions Precautions: Fall Vital Signs Therapy Vitals Temp: 99.5 F (37.5 C) Temp Source: Oral Pulse Rate: 91 Resp: 17 BP: 106/68 Patient Position (if appropriate): Lying Oxygen Therapy SpO2: 96 % O2 Device: Room Air Pain Pain Assessment Pain Scale: 0-10 Pain Score: 0-No pain Pain Type: Acute pain;Surgical pain Pain Location: Leg Pain Orientation: Right Pain Descriptors / Indicators: Aching Pain Frequency: Constant Pain Onset: On-going Patients Stated Pain Goal: 3 Pain Intervention(s): Medication (See eMAR)(oxycodone 10 mg po) ADL ADL ADL Comments: see functional navigator Vision Baseline Vision/History: No visual deficits Cognition Overall Cognitive Status: Within Functional Limits for tasks assessed Arousal/Alertness: Awake/alert Orientation Level: Oriented X4 Sensation Sensation Light Touch: Impaired Detail Light Touch Impaired Details: Impaired RLE;Impaired LLE Proprioception: Impaired Detail Proprioception Impaired Details: Impaired LLE Coordination Gross Motor Movements are Fluid and Coordinated: Yes Fine Motor Movements are Fluid and Coordinated: Yes Motor  Motor Motor: Other (comment) Motor - Skilled Clinical Observations: generalized weakness Mobility  Bed Mobility Bed Mobility: Rolling Right;Rolling Left;Supine to Sit;Sit to Supine Rolling Right: 6: Modified independent (Device/Increase time) Rolling Left: 6: Modified independent (Device/Increase time) Supine to Sit: 6: Modified independent (Device/Increase time) Sit to Supine: 6: Modified independent (Device/Increase time) Transfers Sit to Stand: 6: Modified independent (Device/Increase time) Sit to Stand Details: Verbal cues for precautions/safety   Trunk/Postural Assessment  Cervical Assessment Cervical Assessment: Within Functional Limits  Thoracic Assessment Thoracic Assessment: Exceptions to [facility] Lumbar Assessment Lumbar Assessment: Exceptions to WFL(posterior pelvic tilt) Postural Control Postural Control: Within Functional Limits  Balance Balance Balance Assessed: Yes Static Sitting Balance Static Sitting - Level of Assistance: 6: Modified independent (Device/Increase time) Dynamic Sitting Balance Dynamic Sitting - Level of Assistance: 6: Modified independent (Device/Increase time) Static Standing Balance Static Standing - Level of Assistance: 6: Modified independent (Device/Increase time) Extremity/Trunk Assessment RUE Assessment RUE Assessment: Within Functional Limits LUE Assessment LUE Assessment: Within Functional Limits   See Function Navigator for Current Functional Status.  [name] 04/25/2018, 4:22 PM

## 2018-04-25 NOTE — Progress Notes (Signed)
[place] PHYSICAL MEDICINE & REHABILITATION     PROGRESS NOTE    Subjective/Complaints: No new problems. Continues to ask for sugar, perseverates at times on pain  ROS: Patient denies fever, rash, sore throat, blurred vision, nausea, vomiting, diarrhea, cough, shortness of breath or chest pain,   headache, or mood change. . .    Objective: Vital Signs: Blood pressure (!) 147/74, pulse 96, temperature 99.7 F (37.6 C), temperature source Oral, resp. rate 17, height 5\' 5"  (1.651 m), weight 93.2 kg (205 lb 7.5 oz), SpO2 97 %. No results found. Recent Labs    04/23/18 0513 04/24/18 0512  WBC 16.2* 11.2*  HGB 10.8* 10.9*  HCT 34.2* 33.9*  PLT 340 325   Recent Labs    04/23/18 0513  NA 134*  K 4.2  CL 101  GLUCOSE 238*  BUN 9  CREATININE 0.70  CALCIUM 8.9   CBG (last 3)  Recent Labs    04/24/18 1621 04/24/18 2120 04/25/18 0637  GLUCAP 120* 227* 110*    Wt Readings from Last 3 Encounters:  04/25/18 93.2 kg (205 lb 7.5 oz)  04/13/18 96 kg (211 lb 10.3 oz)  03/19/18 90.3 kg (199 lb)    Physical Exam:  Constitutional: No distress . Vital signs reviewed. HEENT: EOMI, oral membranes moist Neck: supple Cardiovascular: RRR without murmur. No JVD    Respiratory: CTA Bilaterally without wheezes or rales. Normal effort    GI: BS +, non-tender, non-distended   Musculoskeletal:stump tender Neurological: She isalertand oriented to person, place, and time.  She is at cognitive baseline.  Motor 5/5 UE.  Right lower extremity limited by pain somewhat Skin: Skin iswarmand dry. Callus on left first MTP, old wound  - ongoing bruising, small area of bruising between 3rd/4th toes. Psychiatric:  anxious    Assessment/Plan: 1. Functional deficits secondary to right BKA which require 3+ hours per day of interdisciplinary therapy in a comprehensive inpatient rehab setting. Physiatrist is providing close team supervision and 24 hour management of active medical problems  listed below. Physiatrist and rehab team continue to assess barriers to discharge/monitor patient progress toward functional and medical goals.  Function:  Bathing Bathing position   Position: Sitting EOB  Bathing parts Body parts bathed by patient: Right arm, Left arm, Chest, Abdomen, Front perineal area, Buttocks, Right upper leg, Left upper leg, Left lower leg Body parts bathed by helper: Back  Bathing assist Assist Level: Supervision or verbal cues, Set up   Set up : To obtain items  Upper Body Dressing/Undressing Upper body dressing   What is the patient wearing?: Pull over shirt/dress     Pull over shirt/dress - Perfomed by patient: Thread/unthread right sleeve, Thread/unthread left sleeve, Put head through opening, Pull shirt over trunk          Upper body assist Assist Level: More than reasonable time   Set up : To obtain clothing/put away  Lower Body Dressing/Undressing Lower body dressing   What is the patient wearing?: Shoes, Non-skid slipper socks, Pants, Underwear Underwear - Performed by patient: Thread/unthread right underwear leg, Thread/unthread left underwear leg, Pull underwear up/down   Pants- Performed by patient: Thread/unthread right pants leg, Thread/unthread left pants leg, Pull pants up/down   Non-skid slipper socks- Performed by patient: Don/doff left sock   Socks - Performed by patient: Don/doff left sock   Shoes - Performed by patient: Don/doff left shoe, Fasten left            Lower body assist  Assist for lower body dressing: Supervision or verbal cues      Toileting Toileting   Toileting steps completed by patient: Adjust clothing prior to toileting, Adjust clothing after toileting, Performs perineal hygiene Toileting steps completed by helper: Adjust clothing prior to toileting, Adjust clothing after toileting Toileting Assistive Devices: Grab bar or rail  Toileting assist Assist level: Touching or steadying assistance (Pt.75%)    Transfers Chair/bed transfer   Chair/bed transfer method: Stand pivot Chair/bed transfer assist level: Supervision or verbal cues Chair/bed transfer assistive device: Medical sales representative     Max distance: 4 Assist level: Touching or steadying assistance (Pt > 75%)   Wheelchair   Type: Manual Max wheelchair distance: 11ft  Assist Level: Supervision or verbal cues  Cognition Comprehension Comprehension assist level: Follows basic conversation/direction with no assist  Expression Expression assist level: Expresses basic needs/ideas: With no assist  Social Interaction Social Interaction assist level: Interacts appropriately 90% of the time - Needs monitoring or encouragement for participation or interaction.  Problem Solving Problem solving assist level: Solves basic 75 - 89% of the time/requires cueing 10 - 24% of the time  Memory Memory assist level: Recognizes or recalls 75 - 89% of the time/requires cueing 10 - 24% of the time   Medical Problem List and Plan: 1.Functional deficitssecondary to right BKA -continue therapies   -Patient continues to display poor insight re: her condition 2. DVT Prophylaxis/Anticoagulation: Pharmaceutical:Lovenox 3. Pain Management:oxycodone prn. gabapentin 4. 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   -appreciate neuropsychology evaluation   5. Neuropsych: This patientiscapable of making decisions on herown behalf. 6. Skin/Wound Care:Monitor wound for healing. Routine pressure relief measures.   -DSD to stump, shrinker, guard---area appears stable  - continue dry dressing/padding of left 1st mtp, darco shoe for transfers    7. Fluids/Electrolytes/Nutrition:Monitor I/O.   - .    8.T2DM: Monitor BS ac/hs.   SSI.      -pt with extremely poor insight re: diabetes.    -Increased mealtime coverage to 7 units on 4/29---expect ongoing elevation  -increased lantus to 28u daily  -improved but not optimal control  9. Step agalactiae sepsis: I changed IV ampicillin to amoxicillin to complete course.  Ends on 4/28 (today) 10. Bipolar disorder/Personality disorder: On Seroquel at bedtime. 11. Leukocytosis:     -Urinalysis negative, urine culture insigificant growth  -Wound appears stable.   -wbc's down to 11.2  LOS (Days) 9 A FACE TO FACE EVALUATION WAS PERFORMED  Meredith Staggers, MD 04/25/2018 8:17 AM

## 2018-04-26 ENCOUNTER — Inpatient Hospital Stay (HOSPITAL_COMMUNITY)
Admission: EM | Admit: 2018-04-26 | Discharge: 2018-04-30 | DRG: 863 | Disposition: A | Discharge status: Home / Self Care | Payer: Medicare Other | Attending: Internal Medicine | Admitting: Internal Medicine

## 2018-04-26 ENCOUNTER — Encounter (HOSPITAL_COMMUNITY): Payer: Self-pay

## 2018-04-26 ENCOUNTER — Emergency Department (HOSPITAL_COMMUNITY): Payer: Medicare Other

## 2018-04-26 DIAGNOSIS — I110 Hypertensive heart disease with heart failure: Secondary | ICD-10-CM | POA: Diagnosis present

## 2018-04-26 DIAGNOSIS — L03115 Cellulitis of right lower limb: Secondary | ICD-10-CM | POA: Diagnosis present

## 2018-04-26 DIAGNOSIS — R6510 Systemic inflammatory response syndrome (SIRS) of non-infectious origin without acute organ dysfunction: Secondary | ICD-10-CM

## 2018-04-26 DIAGNOSIS — E7800 Pure hypercholesterolemia, unspecified: Secondary | ICD-10-CM | POA: Diagnosis present

## 2018-04-26 DIAGNOSIS — K219 Gastro-esophageal reflux disease without esophagitis: Secondary | ICD-10-CM | POA: Diagnosis present

## 2018-04-26 DIAGNOSIS — Z85038 Personal history of other malignant neoplasm of large intestine: Secondary | ICD-10-CM

## 2018-04-26 DIAGNOSIS — R509 Fever, unspecified: Secondary | ICD-10-CM | POA: Diagnosis present

## 2018-04-26 DIAGNOSIS — Z833 Family history of diabetes mellitus: Secondary | ICD-10-CM

## 2018-04-26 DIAGNOSIS — E1165 Type 2 diabetes mellitus with hyperglycemia: Secondary | ICD-10-CM | POA: Diagnosis present

## 2018-04-26 DIAGNOSIS — A419 Sepsis, unspecified organism: Secondary | ICD-10-CM

## 2018-04-26 DIAGNOSIS — T8141XA Infection following a procedure, superficial incisional surgical site, initial encounter: Secondary | ICD-10-CM | POA: Diagnosis present

## 2018-04-26 DIAGNOSIS — E871 Hypo-osmolality and hyponatremia: Secondary | ICD-10-CM | POA: Diagnosis present

## 2018-04-26 DIAGNOSIS — G4733 Obstructive sleep apnea (adult) (pediatric): Secondary | ICD-10-CM | POA: Diagnosis present

## 2018-04-26 DIAGNOSIS — Z91048 Other nonmedicinal substance allergy status: Secondary | ICD-10-CM

## 2018-04-26 DIAGNOSIS — D638 Anemia in other chronic diseases classified elsewhere: Secondary | ICD-10-CM | POA: Diagnosis present

## 2018-04-26 DIAGNOSIS — T8144XA Sepsis following a procedure, initial encounter: Secondary | ICD-10-CM | POA: Diagnosis not present

## 2018-04-26 DIAGNOSIS — T8743 Infection of amputation stump, right lower extremity: Secondary | ICD-10-CM | POA: Diagnosis present

## 2018-04-26 DIAGNOSIS — G2581 Restless legs syndrome: Secondary | ICD-10-CM | POA: Diagnosis present

## 2018-04-26 DIAGNOSIS — Z888 Allergy status to other drugs, medicaments and biological substances status: Secondary | ICD-10-CM

## 2018-04-26 DIAGNOSIS — S88119S Complete traumatic amputation at level between knee and ankle, unspecified lower leg, sequela: Secondary | ICD-10-CM | POA: Diagnosis not present

## 2018-04-26 DIAGNOSIS — T8149XA Infection following a procedure, other surgical site, initial encounter: Secondary | ICD-10-CM | POA: Diagnosis not present

## 2018-04-26 DIAGNOSIS — Z886 Allergy status to analgesic agent status: Secondary | ICD-10-CM

## 2018-04-26 DIAGNOSIS — Z9111 Patient's noncompliance with dietary regimen: Secondary | ICD-10-CM

## 2018-04-26 DIAGNOSIS — Z6833 Body mass index (BMI) 33.0-33.9, adult: Secondary | ICD-10-CM

## 2018-04-26 DIAGNOSIS — F418 Other specified anxiety disorders: Secondary | ICD-10-CM | POA: Diagnosis present

## 2018-04-26 DIAGNOSIS — J449 Chronic obstructive pulmonary disease, unspecified: Secondary | ICD-10-CM | POA: Diagnosis present

## 2018-04-26 DIAGNOSIS — Z9049 Acquired absence of other specified parts of digestive tract: Secondary | ICD-10-CM

## 2018-04-26 DIAGNOSIS — F419 Anxiety disorder, unspecified: Secondary | ICD-10-CM | POA: Diagnosis present

## 2018-04-26 DIAGNOSIS — Y835 Amputation of limb(s) as the cause of abnormal reaction of the patient, or of later complication, without mention of misadventure at the time of the procedure: Secondary | ICD-10-CM | POA: Diagnosis present

## 2018-04-26 DIAGNOSIS — D72823 Leukemoid reaction: Secondary | ICD-10-CM

## 2018-04-26 DIAGNOSIS — K449 Diaphragmatic hernia without obstruction or gangrene: Secondary | ICD-10-CM | POA: Diagnosis present

## 2018-04-26 DIAGNOSIS — E669 Obesity, unspecified: Secondary | ICD-10-CM | POA: Diagnosis present

## 2018-04-26 DIAGNOSIS — M1990 Unspecified osteoarthritis, unspecified site: Secondary | ICD-10-CM | POA: Diagnosis present

## 2018-04-26 DIAGNOSIS — I5042 Chronic combined systolic (congestive) and diastolic (congestive) heart failure: Secondary | ICD-10-CM | POA: Diagnosis present

## 2018-04-26 DIAGNOSIS — I10 Essential (primary) hypertension: Secondary | ICD-10-CM | POA: Diagnosis not present

## 2018-04-26 DIAGNOSIS — E785 Hyperlipidemia, unspecified: Secondary | ICD-10-CM | POA: Diagnosis present

## 2018-04-26 DIAGNOSIS — F319 Bipolar disorder, unspecified: Secondary | ICD-10-CM | POA: Diagnosis present

## 2018-04-26 DIAGNOSIS — Z794 Long term (current) use of insulin: Secondary | ICD-10-CM | POA: Diagnosis present

## 2018-04-26 DIAGNOSIS — E1142 Type 2 diabetes mellitus with diabetic polyneuropathy: Secondary | ICD-10-CM | POA: Diagnosis present

## 2018-04-26 DIAGNOSIS — Z91040 Latex allergy status: Secondary | ICD-10-CM

## 2018-04-26 DIAGNOSIS — Z8249 Family history of ischemic heart disease and other diseases of the circulatory system: Secondary | ICD-10-CM

## 2018-04-26 DIAGNOSIS — Z89511 Acquired absence of right leg below knee: Secondary | ICD-10-CM

## 2018-04-26 DIAGNOSIS — F17210 Nicotine dependence, cigarettes, uncomplicated: Secondary | ICD-10-CM | POA: Diagnosis present

## 2018-04-26 DIAGNOSIS — Z79899 Other long term (current) drug therapy: Secondary | ICD-10-CM

## 2018-04-26 DIAGNOSIS — I739 Peripheral vascular disease, unspecified: Secondary | ICD-10-CM | POA: Diagnosis not present

## 2018-04-26 DIAGNOSIS — E1151 Type 2 diabetes mellitus with diabetic peripheral angiopathy without gangrene: Secondary | ICD-10-CM | POA: Diagnosis present

## 2018-04-26 DIAGNOSIS — E11610 Type 2 diabetes mellitus with diabetic neuropathic arthropathy: Secondary | ICD-10-CM | POA: Diagnosis present

## 2018-04-26 DIAGNOSIS — Z9119 Patient's noncompliance with other medical treatment and regimen: Secondary | ICD-10-CM | POA: Diagnosis not present

## 2018-04-26 DIAGNOSIS — Z91119 Patient's noncompliance with dietary regimen due to unspecified reason: Secondary | ICD-10-CM

## 2018-04-26 HISTORY — DX: Restless legs syndrome: G25.81

## 2018-04-26 LAB — COMPREHENSIVE METABOLIC PANEL
ALBUMIN: 2.9 g/dL — AB (ref 3.5–5.0)
ALK PHOS: 97 U/L (ref 38–126)
ALT: 16 U/L (ref 14–54)
ANION GAP: 16 — AB (ref 5–15)
AST: 17 U/L (ref 15–41)
BUN: 11 mg/dL (ref 6–20)
CALCIUM: 9.1 mg/dL (ref 8.9–10.3)
CO2: 24 mmol/L (ref 22–32)
Chloride: 94 mmol/L — ABNORMAL LOW (ref 101–111)
Creatinine, Ser: 0.73 mg/dL (ref 0.44–1.00)
GFR calc Af Amer: >60 mL/min (ref >60)
GFR calc non Af Amer: >60 mL/min (ref >60)
GLUCOSE: 209 mg/dL — AB (ref 65–99)
Potassium: 3.9 mmol/L (ref 3.5–5.1)
Sodium: 134 mmol/L — ABNORMAL LOW (ref 135–145)
TOTAL PROTEIN: 7 g/dL (ref 6.5–8.1)
Total Bilirubin: 0.4 mg/dL (ref 0.3–1.2)

## 2018-04-26 LAB — CBC WITH DIFFERENTIAL/PLATELET
BASOS ABS: 0.1 10*3/uL (ref 0.0–0.1)
BASOS PCT: 0 %
EOS PCT: 2 %
Eosinophils Absolute: 0.5 10*3/uL (ref 0.0–0.7)
HCT: 35.6 % — ABNORMAL LOW (ref 36.0–46.0)
Hemoglobin: 11.7 g/dL — ABNORMAL LOW (ref 12.0–15.0)
Lymphocytes Relative: 12 %
Lymphs Abs: 2.8 10*3/uL (ref 0.7–4.0)
MCH: 30.6 pg (ref 26.0–34.0)
MCHC: 32.9 g/dL (ref 30.0–36.0)
MCV: 93.2 fL (ref 78.0–100.0)
MONO ABS: 0.6 10*3/uL (ref 0.1–1.0)
Monocytes Relative: 3 %
Neutro Abs: 20.3 10*3/uL — ABNORMAL HIGH (ref 1.7–7.7)
Neutrophils Relative %: 83 %
PLATELETS: 350 10*3/uL (ref 150–400)
RBC: 3.82 MIL/uL — ABNORMAL LOW (ref 3.87–5.11)
RDW: 14 % (ref 11.5–15.5)
WBC: 24.3 10*3/uL — ABNORMAL HIGH (ref 4.0–10.5)

## 2018-04-26 LAB — CBC
HCT: 34.5 % — ABNORMAL LOW (ref 36.0–46.0)
Hemoglobin: 11 g/dL — ABNORMAL LOW (ref 12.0–15.0)
MCH: 29.8 pg (ref 26.0–34.0)
MCHC: 31.9 g/dL (ref 30.0–36.0)
MCV: 93.5 fL (ref 78.0–100.0)
Platelets: 333 10*3/uL (ref 150–400)
RBC: 3.69 MIL/uL — ABNORMAL LOW (ref 3.87–5.11)
RDW: 13.7 % (ref 11.5–15.5)
WBC: 9.1 10*3/uL (ref 4.0–10.5)

## 2018-04-26 LAB — I-STAT CG4 LACTIC ACID, ED
LACTIC ACID, VENOUS: 3.77 mmol/L — AB (ref 0.5–1.9)
Lactic Acid, Venous: 4.62 mmol/L (ref 0.5–1.9)

## 2018-04-26 LAB — GLUCOSE, CAPILLARY: Glucose-Capillary: 131 mg/dL — ABNORMAL HIGH (ref 65–99)

## 2018-04-26 MED ORDER — QUETIAPINE FUMARATE 300 MG PO TABS
600.0000 mg | ORAL_TABLET | Freq: Every day | ORAL | Status: DC
  Administered 2018-04-27 – 2018-04-29 (×4): 600 mg via ORAL
  Filled 2018-04-26 (×4): qty 2

## 2018-04-26 MED ORDER — INSULIN GLARGINE 100 UNIT/ML ~~LOC~~ SOLN
28.0000 [IU] | Freq: Every day | SUBCUTANEOUS | Status: DC
  Administered 2018-04-27 – 2018-04-28 (×2): 28 [IU] via SUBCUTANEOUS
  Filled 2018-04-26 (×2): qty 0.28

## 2018-04-26 MED ORDER — BISACODYL 5 MG PO TBEC: 5.0000 mg | DELAYED_RELEASE_TABLET | Freq: Every day | ORAL | Status: DC | PRN

## 2018-04-26 MED ORDER — METHOCARBAMOL 500 MG PO TABS
500.0000 mg | ORAL_TABLET | Freq: Four times a day (QID) | ORAL | Status: DC | PRN
  Administered 2018-04-28 – 2018-04-30 (×7): 500 mg via ORAL
  Filled 2018-04-26 (×7): qty 1

## 2018-04-26 MED ORDER — LIDOCAINE 5 % EX PTCH: 1.0000 | MEDICATED_PATCH | Freq: Every day | CUTANEOUS | Status: DC | PRN

## 2018-04-26 MED ORDER — DOCUSATE SODIUM 100 MG PO CAPS
100.0000 mg | ORAL_CAPSULE | Freq: Two times a day (BID) | ORAL | Status: DC
  Administered 2018-04-27 – 2018-04-30 (×8): 100 mg via ORAL
  Filled 2018-04-26 (×8): qty 1

## 2018-04-26 MED ORDER — GABAPENTIN 600 MG PO TABS
600.0000 mg | ORAL_TABLET | Freq: Two times a day (BID) | ORAL | Status: DC
  Administered 2018-04-27 – 2018-04-30 (×8): 600 mg via ORAL
  Filled 2018-04-26 (×8): qty 1

## 2018-04-26 MED ORDER — VANCOMYCIN HCL IN DEXTROSE 1-5 GM/200ML-% IV SOLN
1000.0000 mg | Freq: Once | INTRAVENOUS | Status: AC
  Administered 2018-04-26: 1000 mg via INTRAVENOUS
  Filled 2018-04-26: qty 200

## 2018-04-26 MED ORDER — ACETAMINOPHEN 650 MG RE SUPP: 650.0000 mg | Freq: Four times a day (QID) | RECTAL | Status: DC | PRN

## 2018-04-26 MED ORDER — ONDANSETRON HCL 4 MG/2ML IJ SOLN: 4.0000 mg | Freq: Four times a day (QID) | INTRAMUSCULAR | Status: DC | PRN

## 2018-04-26 MED ORDER — PANTOPRAZOLE SODIUM 40 MG PO TBEC
40.0000 mg | DELAYED_RELEASE_TABLET | Freq: Every day | ORAL | Status: DC
  Administered 2018-04-27 – 2018-04-30 (×4): 40 mg via ORAL
  Filled 2018-04-26 (×4): qty 1

## 2018-04-26 MED ORDER — NORTRIPTYLINE HCL 25 MG PO CAPS: 25.0000 mg | ORAL_CAPSULE | Freq: Every day | ORAL | 0 refills | Status: DC

## 2018-04-26 MED ORDER — ENOXAPARIN SODIUM 40 MG/0.4ML ~~LOC~~ SOLN
40.0000 mg | SUBCUTANEOUS | Status: DC
  Administered 2018-04-27 – 2018-04-29 (×4): 40 mg via SUBCUTANEOUS
  Filled 2018-04-26 (×4): qty 0.4

## 2018-04-26 MED ORDER — ROPINIROLE HCL 1 MG PO TABS
2.0000 mg | ORAL_TABLET | Freq: Every day | ORAL | Status: DC
  Administered 2018-04-27 – 2018-04-30 (×4): 2 mg via ORAL
  Filled 2018-04-26 (×4): qty 2

## 2018-04-26 MED ORDER — INSULIN GLARGINE 100 UNIT/ML ~~LOC~~ SOLN: 28.0000 [IU] | Freq: Every day | SUBCUTANEOUS | 11 refills | Status: DC

## 2018-04-26 MED ORDER — METHOCARBAMOL 500 MG PO TABS: 500.0000 mg | ORAL_TABLET | Freq: Four times a day (QID) | ORAL | 0 refills | Status: DC | PRN

## 2018-04-26 MED ORDER — ONDANSETRON HCL 4 MG PO TABS: 4.0000 mg | ORAL_TABLET | Freq: Four times a day (QID) | ORAL | Status: DC | PRN

## 2018-04-26 MED ORDER — MORPHINE SULFATE (PF) 4 MG/ML IV SOLN
4.0000 mg | Freq: Once | INTRAVENOUS | Status: AC
  Administered 2018-04-26: 4 mg via INTRAVENOUS
  Filled 2018-04-26: qty 1

## 2018-04-26 MED ORDER — ACETAMINOPHEN 325 MG PO TABS
650.0000 mg | ORAL_TABLET | Freq: Four times a day (QID) | ORAL | Status: DC | PRN
  Administered 2018-04-29 – 2018-04-30 (×3): 650 mg via ORAL
  Filled 2018-04-26 (×3): qty 2

## 2018-04-26 MED ORDER — CLONAZEPAM 0.5 MG PO TABS: 0.5000 mg | ORAL_TABLET | Freq: Three times a day (TID) | ORAL | 0 refills | Status: DC | PRN

## 2018-04-26 MED ORDER — PANTOPRAZOLE SODIUM 40 MG PO TBEC: 40.0000 mg | DELAYED_RELEASE_TABLET | Freq: Every day | ORAL | 0 refills | Status: DC

## 2018-04-26 MED ORDER — CLONAZEPAM 0.5 MG PO TABS: 0.5000 mg | ORAL_TABLET | Freq: Three times a day (TID) | ORAL | Status: DC | PRN

## 2018-04-26 MED ORDER — SODIUM CHLORIDE 0.9 % IV BOLUS
500.0000 mL | Freq: Once | INTRAVENOUS | Status: AC
  Administered 2018-04-26: 500 mL via INTRAVENOUS

## 2018-04-26 MED ORDER — CEFAZOLIN SODIUM-DEXTROSE 1-4 GM/50ML-% IV SOLN
1.0000 g | Freq: Once | INTRAVENOUS | Status: AC
Start: 2018-04-26 — End: 2018-04-26
  Administered 2018-04-26: 1 g via INTRAVENOUS
  Filled 2018-04-26: qty 50

## 2018-04-26 MED ORDER — OXYCODONE HCL 10 MG PO TABS: 10.0000 mg | ORAL_TABLET | Freq: Four times a day (QID) | ORAL | 0 refills | Status: DC | PRN

## 2018-04-26 MED ORDER — NORTRIPTYLINE HCL 25 MG PO CAPS
25.0000 mg | ORAL_CAPSULE | Freq: Every day | ORAL | Status: DC
  Administered 2018-04-27 – 2018-04-29 (×4): 25 mg via ORAL
  Filled 2018-04-26 (×4): qty 1

## 2018-04-26 MED ORDER — OXYCODONE HCL 5 MG PO TABS
5.0000 mg | ORAL_TABLET | ORAL | Status: DC | PRN
  Administered 2018-04-27 – 2018-04-28 (×5): 5 mg via ORAL
  Filled 2018-04-26 (×5): qty 1

## 2018-04-26 MED ORDER — ROPINIROLE HCL 2 MG PO TABS: 2.0000 mg | ORAL_TABLET | Freq: Every day | ORAL | 0 refills | Status: DC

## 2018-04-26 NOTE — ED Provider Notes (Signed)
[facility] Provider Note   CSN: [id number] Arrival date & time: 04/26/18  1911     History   Chief Complaint Chief Complaint  Patient presents with  . Fever    HPI [name] is a 52 y.o. female.  HPI Patient presents on day of discharge, following admission for sepsis, right lower leg wound, now with concern of fever, pain, redness about the wound. She notes that she actually began feeling poorly yesterday, the day prior to discharge, but did not tell anyone she was feeling ill. However, soon after arriving home, she began describing to her husband that she was feeling febrile, with increasing pain around her incision site. There is associated nausea, headache, fatigue, minimal relief with Excedrin. The pain is sore, severe. No confusion, disorientation, vomiting. After speaking with nursing staff from her hospital floor she was referred here for evaluation. Past Medical History:  Diagnosis Date  . Anxiety   . Arthritis   . Bipolar disorder ([place])   . Broken jaw ([place])   . Broken wrist   . Chronic back pain   . Claudication ([place])    a. 12/2013 ABI's: R 0.97, L 0.94.  [name] COPD (chronic obstructive pulmonary disease) ([place])   . Depression   . Diabetic Charcot's foot ([place])   . Diabetic foot ulcer (HCC)    chronic left foot ulcer , great toe/  hx recurrent foot ulcer bilaterally  . DJD (degenerative joint disease)   . Edema of both lower extremities   . Episode of memory loss   . GERD (gastroesophageal reflux disease)   . Headache(784.0)   . Hiatal hernia   . History of colon cancer, stage I dx 02-26-2015--- oncologist-- dr [name]--- per last note no recurrence   05-16-2015 s/p  Laparoscopic sigmoid colectomy w/ node bx's (negative nodes per path)  Stage I (pT1,N0,M0) Grade 2  . 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   per MRI multiple small strokes post cardiac cath. --  residual mild memory loss  .  History of methicillin resistant staphylococcus aureus (MRSA)   . Hypercholesterolemia   . Insomnia   . Insulin dependent type 2 diabetes mellitus, uncontrolled ([place]) dx 2004   endocrinologist-  dr Dwyane Dee-- last A1c 8.8 in Aug2018:  pt is noncompliant w/ diet, states does note eat breakfast , her first main meal in afternoon  . Neurogenic bladder   . Neuropathy, diabetic ([place])    hands and feet  . OSA (obstructive sleep apnea)    cpap intolerant  . Personality disorder ([place])   . SOB (shortness of breath) on exertion   . Stroke ([place])   . SUI (stress urinary incontinence, female) S/P SLING 12-29-2011    Patient Active Problem List   Diagnosis Date Noted  . Hyponatremia 04/26/2018  . Noncompliance with dietary restriction 04/26/2018  . At high risk for falls 04/24/2018  . Noncompliance with safety precautions 04/24/2018  . 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 PAD (peripheral artery disease) ([place])   . Poorly controlled type 2 diabetes mellitus ([place])   . Cutaneous abscess of right foot   . Subacute osteomyelitis, right ankle and foot ([place])   . Major depressive disorder, recurrent episode ([place]) 04/11/2018  . Diabetic ulcer of right midfoot associated with type 2 diabetes mellitus, with necrosis of bone ([place])   . Abscess of ankle   . Infectious synovitis   . Diabetic foot infection ([place])   .  Severe sepsis ([place]) 04/09/2018  . Cancer of sigmoid colon ([place]) 03/09/2015  . OSA (obstructive sleep apnea) 07/04/2014  . Migraine without aura, with intractable migraine, so stated, without mention of status migrainosus 03/26/2014  . Peripheral neuropathy 03/03/2014  . Abnormal stress test 02/12/2014  . CVA (cerebral infarction) 02/12/2014  . Chest pain   . Abnormal heart rhythm   . Edema   . Hypertension   . Hyperlipemia   . Hypercholesterolemia   . Other and unspecified hyperlipidemia 11/14/2013  . Type II diabetes mellitus, uncontrolled ([place]) 07/22/2013  . Essential hypertension  07/22/2013  . 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 Diabetic polyneuropathy ([place]) 07/22/2013    Past Surgical History:  Procedure Laterality Date  . ABDOMINAL HYSTERECTOMY    . AMPUTATION Right 04/13/2018   Procedure: RIGHT BELOW KNEE AMPUTATION;  Surgeon: Newt Minion, MD;  Location: [facility];  Service: Orthopedics;  Laterality: Right;  . ANTERIOR CERVICAL DECOMP/DISCECTOMY FUSION  2000   C5 - 7  . CARDIAC CATHETERIZATION  05-22-2008   DR SKAINS   NO SIGNIFECANT CAD/ NORMAL LV/  EF 65%/  NO WALL MOTION ABNORMALITIES  . CARPAL TUNNEL RELEASE Right 04-25-2013  . [place]; 1992  . COLONOSCOPY    . CYSTO N/A 04/30/2013   Procedure: CYSTOSCOPY;  Surgeon: Reece Packer, MD;  Location: [facility];  Service: Urology;  Laterality: N/A;  . CYSTOSCOPY MACROPLASTIQUE IMPLANT N/A 02/06/2018   Procedure: CYSTOSCOPY MACROPLASTIQUE IMPLANT;  Surgeon: Bjorn Loser, MD;  Location[facility];  Service: Urology;  Laterality: N/A;  . CYSTOSCOPY WITH INJECTION  05/04/2012   Procedure: CYSTOSCOPY WITH INJECTION;  Surgeon: Reece Packer, MD;  Location: [facility];  Service: Urology;  Laterality: N/A;  MACROPLASTIQUE INJECTION  . CYSTOSCOPY WITH INJECTION  08/28/2012   Procedure: CYSTOSCOPY WITH INJECTION;  Surgeon: Reece Packer, MD;  Location: [facility];  Service: Urology;  Laterality: N/A;  cysto and macroplastique   . FOOT SURGERY Bilateral   . HERNIA REPAIR  ?1996   "stomach"  . INCISION AND DRAINAGE OF WOUND Right 04/11/2018   Procedure: IRRIGATION AND DEBRIDEMENT WOUND right foot and right ankle;  Surgeon: Evelina Bucy, DPM;  Location: [facility];  Service: Podiatry;  Laterality: Right;  . KNEE ARTHROSCOPY W/ ALLOGRAFT IMPANT Left    graft x 2  . KNEE SURGERY     TOTAL 8 SURG'S  . LAPAROSCOPIC SIGMOID COLECTOMY N/A 04/22/2015   Procedure: LAPAROSCOPIC HAND  ASSISTED SIGMOID COLECTOMY;  Surgeon: Erroll Luna, MD;  Location: [facility];  Service: General;  Laterality: N/A;  . LEFT HEART CATHETERIZATION WITH CORONARY ANGIOGRAM N/A 02/12/2014   Procedure: LEFT HEART CATHETERIZATION WITH CORONARY ANGIOGRAM;  Surgeon: Candee Furbish, MD;  Location: [facility];  Service: Cardiovascular;  Laterality: N/A;   No angiographically significant CAD; normal LVSF, LVEDP 21mmHg,  EF 55% (new finding ef 30% myoview 01-08-2014)  . LUMBAR FUSION    . MANDIBLE FRACTURE SURGERY    . MULTIPLE LAPAROSCOPIES FOR ENDOMETRIOSIS    . PUBOVAGINAL SLING  12/29/2011   Procedure: Gaynelle Arabian;  Surgeon: Reece Packer, MD;  Location[facility];  Service: Urology;  Laterality: N/A;  cysto and sparc sling   . PUBOVAGINAL SLING N/A 04/30/2013   Procedure: REMOVAL OF VAGINAL MESH;  Surgeon: Reece Packer, MD;  Location: [facility];  Service: Urology;  Laterality: N/A;  . RECONSTURCTION OF CONGENITAL UTERUS  ANOMALY  1983  . REPEAT RECONSTRUCTION ACL LEFT KNEE/ SCREWS REMOVED  03-28-2000   CADAVER GRAFT  . TOTAL ABDOMINAL HYSTERECTOMY W/ BILATERAL SALPINGOOPHORECTOMY  1997  . TRANSTHORACIC ECHOCARDIOGRAM  02/13/2014   ef 45%, hypokinesis base inferior and base inferolateral walls  . UPPER GI ENDOSCOPY    . WOUND DEBRIDEMENT Right 09/05/2016   Procedure: DEBRIDEMENT WOUND WITH GRAFT RIGHT FOOT;  Surgeon: Landis Martins, DPM;  Location: [facility];  Service: Podiatry;  Laterality: Right;  . WRIST FRACTURE SURGERY       OB History   None      Home Medications    Prior to Admission medications   Medication Sig Start Date End Date Taking? Authorizing Provider  acetaminophen (TYLENOL) 325 MG tablet Take 1-2 tablets (325-650 mg total) by mouth every 4 (four) hours as needed for mild pain. 04/23/18   Love, Ivan Anchors, PA-C  bisacodyl (DULCOLAX) 5 MG EC tablet Take 1 tablet (5 mg total) by mouth daily as needed for moderate constipation. 04/16/18   Elgergawy, Silver Huguenin, MD    clonazePAM (KLONOPIN) 0.5 MG tablet Take 1 tablet (0.5 mg total) by mouth 3 (three) times daily as needed (anxiety). 04/26/18   Love, Ivan Anchors, PA-C  docusate sodium (COLACE) 100 MG capsule Take 1 capsule (100 mg total) by mouth 2 (two) times daily. 04/16/18   Elgergawy, Silver Huguenin, MD  gabapentin (NEURONTIN) 600 MG tablet TAKE 1 TABLET (600 MG TOTAL) BY MOUTH 2 (TWO) TIMES DAILY. 01/24/18   Patel, Arvin Collard K, DO  glucose blood (FREESTYLE LITE) test strip Use to check blood sugars up to 4 times daily. Dx Code E11.65 03/16/18   Elayne Snare, MD  insulin glargine (LANTUS) 100 UNIT/ML injection Inject 0.28 mLs (28 Units total) into the skin daily before breakfast. 04/26/18   Love, Ivan Anchors, PA-C  lidocaine (LIDODERM) 5 % Place 1 patch onto the skin daily as needed (pain). Remove & Discard patch within 12 hours or as directed by MD 02/27/18   Landis Martins, DPM  Melatonin 10 MG CAPS Take 10 mg by mouth at bedtime.     [provider]  methocarbamol (ROBAXIN) 500 MG tablet Take 1 tablet (500 mg total) by mouth every 6 (six) hours as needed for muscle spasms. 04/26/18   Love, Ivan Anchors, PA-C  nicotine (NICODERM CQ - DOSED IN MG/24 HOURS) 21 mg/24hr patch Place 1 patch (21 mg total) onto the skin daily. 04/17/18   Elgergawy, Silver Huguenin, MD  nortriptyline (PAMELOR) 25 MG capsule Take 1 capsule (25 mg total) by mouth at bedtime. 04/26/18   Love, Ivan Anchors, PA-C  ondansetron (ZOFRAN) 4 MG tablet Take 1 tablet (4 mg total) by mouth every 6 (six) hours as needed for nausea. 04/16/18   Elgergawy, Silver Huguenin, MD  oxyCODONE 10 MG TABS Take 1 tablet (10 mg total) by mouth every 6 (six) hours as needed for moderate pain (pain score 4-6). 04/26/18   Love, Ivan Anchors, PA-C  pantoprazole (PROTONIX) 40 MG tablet Take 1 tablet (40 mg total) by mouth daily. 04/27/18   Love, Ivan Anchors, PA-C  QUEtiapine (SEROQUEL) 300 MG tablet Take 2 tablets (600 mg total) by mouth at bedtime. 04/23/18   Love, Ivan Anchors, PA-C  rOPINIRole (REQUIP) 2 MG tablet Take  1 tablet (2 mg total) by mouth daily. 04/26/18   Bary Leriche, PA-C    Family History Family History  Problem Relation Age of Onset  . Hypertension Mother   . Diabetes Mother   .  Cancer - Other Mother        lymphoma   . Cancer - Other Father        lung, bladder cancer   . Heart attack Father   . Cancer - Other Brother        bladder cancer     Social History Social History   Tobacco Use  . Smoking status: Current Every Day Smoker    Packs/day: 4.00    Years: 45.00    Pack years: 180.00    Types: Cigarettes  . Smokeless tobacco: Never Used  . Tobacco comment: per pt started smoking  age 1  Substance Use Topics  . Alcohol use: No    Alcohol/week: 0.0 oz  . Drug use: No     Allergies   Latex; Sweetening enhancer [flavoring agent]; Aspartame and phenylalanine; Ibuprofen; Trazodone and nefazodone; and Triazolam   Review of Systems Review of Systems  Constitutional:       Per HPI, otherwise negative  HENT:       Per HPI, otherwise negative  Respiratory:       Per HPI, otherwise negative  Cardiovascular:       Per HPI, otherwise negative  Gastrointestinal: Negative for vomiting.  Endocrine:       Negative aside from HPI  Genitourinary:       Neg aside from HPI   Musculoskeletal:       Per HPI, otherwise negative  Skin: Positive for color change and wound.  Neurological: Negative for syncope.     Physical Exam Updated Vital Signs BP (!) 156/70   Pulse (!) 112   Temp 99.5 F (37.5 C) (Oral)   Resp 18   SpO2 99%   Physical Exam  Constitutional: She is oriented to person, place, and time. She appears well-developed and well-nourished. No distress.  HENT:  Head: Normocephalic and atraumatic.  Eyes: Conjunctivae and EOM are normal.  Cardiovascular: Regular rhythm. Tachycardia present.  Pulmonary/Chest: Effort normal and breath sounds normal. No stridor. No respiratory distress.  Abdominal: She exhibits no distension.  Musculoskeletal: She exhibits no  edema.       Legs: Neurological: She is alert and oriented to person, place, and time. No cranial nerve deficit.  Skin: Skin is warm and dry.  Psychiatric: She has a normal mood and affect.  Nursing note and vitals reviewed.    ED Treatments / Results  Labs (all labs ordered are listed, but only abnormal results are displayed) Labs Reviewed  COMPREHENSIVE METABOLIC PANEL - Abnormal; Notable for the following components:      Result Value   Sodium 134 (*)    Chloride 94 (*)    Glucose, Bld 209 (*)    Albumin 2.9 (*)    Anion gap 16 (*)    All other components within normal limits  CBC WITH DIFFERENTIAL/PLATELET - Abnormal; Notable for the following components:   WBC 24.3 (*)    RBC 3.82 (*)    Hemoglobin 11.7 (*)    HCT 35.6 (*)    Neutro Abs 20.3 (*)    All other components within normal limits  I-STAT CG4 LACTIC ACID, ED - Abnormal; Notable for the following components:   Lactic Acid, Venous 4.62 (*)    All other components within normal limits  I-STAT CG4 LACTIC ACID, ED - Abnormal; Notable for the following components:   Lactic Acid, Venous 3.77 (*)    All other components within normal limits  CBC WITH DIFFERENTIAL/PLATELET  BASIC METABOLIC  PANEL     Radiology Dg Chest 2 View  Result Date: 04/26/2018 CLINICAL DATA:  52 year old female with fever status post recent amputation EXAM: CHEST - 2 VIEW COMPARISON:  Prior chest x-ray 04/09/2018 FINDINGS: The lungs are clear and negative for focal airspace consolidation, pulmonary edema or suspicious pulmonary nodule. No pleural effusion or pneumothorax. Cardiac and mediastinal contours are within normal limits. No acute fracture or lytic or blastic osseous lesions. The visualized upper abdominal bowel gas pattern is unremarkable. Incompletely imaged anterior cervical fusion hardware. IMPRESSION: No active cardiopulmonary disease. Electronically Signed   By: Jacqulynn Cadet M.D.   On: 04/26/2018 21:14   Dg Tibia/fibula  Right  Result Date: 04/26/2018 CLINICAL DATA:  52 year old female with fever. Recent right below-the-knee amputation EXAM: RIGHT TIBIA AND FIBULA - 2 VIEW COMPARISON:  None. FINDINGS: Surgical changes of below the knee amputation. Surgical staples are present. The osteotomy sites are well-marginated. No evidence of subcutaneous emphysema. No conventional radiographic evidence of osteomyelitis. Mild soft tissue edema at the stump, not unexpected postoperative period. IMPRESSION: 1. Surgical changes of right below-the-knee amputation with expected postoperative edema. 2. No evidence of subcutaneous emphysema or unexpected bony changes. Electronically Signed   By: Jacqulynn Cadet M.D.   On: 04/26/2018 21:13    Procedures Procedures (including critical care time)  Medications Ordered in ED Medications  enoxaparin (LOVENOX) injection 40 mg (has no administration in time range)  acetaminophen (TYLENOL) tablet 650 mg (has no administration in time range)    Or  acetaminophen (TYLENOL) suppository 650 mg (has no administration in time range)  ondansetron (ZOFRAN) tablet 4 mg (has no administration in time range)    Or  ondansetron (ZOFRAN) injection 4 mg (has no administration in time range)  oxyCODONE (Oxy IR/ROXICODONE) immediate release tablet 5 mg (has no administration in time range)  clonazePAM (KLONOPIN) tablet 0.5 mg (has no administration in time range)  bisacodyl (DULCOLAX) EC tablet 5 mg (has no administration in time range)  docusate sodium (COLACE) capsule 100 mg (has no administration in time range)  gabapentin (NEURONTIN) tablet 600 mg (has no administration in time range)  insulin glargine (LANTUS) injection 28 Units (has no administration in time range)  lidocaine (LIDODERM) 5 % 1 patch (has no administration in time range)  methocarbamol (ROBAXIN) tablet 500 mg (has no administration in time range)  QUEtiapine (SEROQUEL) tablet 600 mg (has no administration in time range)   pantoprazole (PROTONIX) EC tablet 40 mg (has no administration in time range)  nortriptyline (PAMELOR) capsule 25 mg (has no administration in time range)  rOPINIRole (REQUIP) tablet 2 mg (has no administration in time range)  morphine 4 MG/ML injection 4 mg (4 mg Intravenous Given 04/26/18 2041)  sodium chloride 0.9 % bolus 500 mL (0 mLs Intravenous Stopped 04/26/18 2257)  ceFAZolin (ANCEF) IVPB 1 g/50 mL premix (0 g Intravenous Stopped 04/26/18 2111)  vancomycin (VANCOCIN) IVPB 1000 mg/200 mL premix (0 mg Intravenous Stopped 04/26/18 2257)     Initial Impression / Assessment and Plan / ED Course  I have reviewed the triage vital signs and the nursing notes.  Pertinent labs & imaging results that were available during my care of the patient were reviewed by me and considered in my medical decision making (see chart for details).   Initial lactic 4.6.  Patient received fluids empirically, and with concern for infection and will receive empiric antibiotics.  I discussed patient's case with her orthopedist, and she will be seen by the orthopedic team in  the morning.  Update:, Labs notable for leukocytosis of greater than 20,000. Given patient's cutaneous findings, her lactic acidosis and leukocytosis, there is concern for cellulitis of her surgical site.  When patient received additional broad-spectrum antibiotics, vancomycin, in addition to an ongoing IV fluids.  Update: On reassessment patient's lactic acid level has diminished, following 2 L fluid resuscitation, and pain has improved with IV narcotics per She has received cefazolin and vancomycin, the latter of which should be appropriate for the patient's culture results from 2 weeks ago. Given concern for cellulitis of the surgical site, systemic inflammatory response syndrome / Sepsis, the patient was admitted for further evaluation and management.    Final Clinical Impressions(s) / ED Diagnoses  Sepsis  CRITICAL CARE Performed by:  Carmin Muskrat Total critical care time: 35 minutes Critical care time was exclusive of separately billable procedures and treating other patients. Critical care was necessary to treat or prevent imminent or life-threatening deterioration. Critical care was time spent personally by me on the following activities: development of treatment plan with patient and/or surrogate as well as nursing, discussions with consultants, evaluation of patient's response to treatment, examination of patient, obtaining history from patient or surrogate, ordering and performing treatments and interventions, ordering and review of laboratory studies, ordering and review of radiographic studies, pulse oximetry and re-evaluation of patient's condition.    Carmin Muskrat, MD 04/27/18 [phone]

## 2018-04-26 NOTE — Progress Notes (Signed)
Recreational Therapy Discharge Summary Patient Details  Name: [name] MRN: [id number] Date of Birth: 03/14/1966 Today's Date: 04/26/2018   Comments on progress toward goals: Pt is scheduled for discharge home today with husband.  TR sessions focused on relaxation training, stress management techniques including the use of diversional activitie, deep breathing exercises and guided imagery.  Also assisted pt in obtaining information and documentation about Emotional Support Animals during LOS.  Reasons for discharge: discharge from hospital  Patient/family agrees with progress made and goals achieved: Yes  Corby Villasenor 04/26/2018, 8:37 AM

## 2018-04-26 NOTE — Progress Notes (Signed)
RN stayed with patient while she packed belongings, then encouraged patient to wait in bed. Call bell in place, spouse arrived. Bed alarm on.

## 2018-04-26 NOTE — Discharge Instructions (Signed)
Inpatient Rehab Discharge Instructions  [name] Discharge date and time: 04/26/18   Activities/Precautions/ Functional Status: Activity: no lifting, driving, or strenuous exercise till cleared by MD Diet: cardiac diet and diabetic diet Wound Care: Wash with soap and water, pat dry and cover with stump shrinker.  Keep wound clean and dry. Monitor wound twice a day --Contact Dr. Sharol Given if you develop any problems with your incision/wound--redness, swelling, increase in pain, drainage or if you develop fever or chills.   Functional status:  ___ No restrictions     ___ Walk up steps independently _X__ 24/7 supervision/assistance   ___ Walk up steps with assistance ___ Intermittent supervision/assistance  ___ Bathe/dress independently ___ Walk with walker     ___ Bathe/dress with assistance ___ Walk Independently    ___ Shower independently _X__transfer with supervision.    ___ Shower with assistance ___ No alcohol     ___ Return to work/school ________     COMMUNITY REFERRALS UPON DISCHARGE:    Home Health:   PT     OT     RN                     Agency:  [facility]     Phone: [phone]   Medical Equipment/Items Ordered: wheelchair, walker, tub bench and commode                                                     Agency/Supplier:  [facility] @ [phone]   GENERAL COMMUNITY RESOURCES FOR PATIENT/FAMILY:  Support Groups:  Amputee Support Group (handout)    Mental Health: resume with [facility]    Special Instructions: 1. Note that insulin dose has been reduced. Monitor blood sugars before meals and at bedtime. Take record with you to the MD office next week for input on diabetic medication.  IF BLOOD SUGARS START RUNNING OVER 150 RESUME HOME DOSE LANTUS.  2. You need to wait and make sure that you have supervision for transfers. Wear limb guard when out of bed. This will protect it if you fall or bump it.  3. IF YOU RESUME SMOKING--THIS WILL LEAD TO  PROBLEMS WITH HEALING AND CAUSE THE EDGES OF YOUR WOUND TO BREAKDOWN.    My questions have been answered and I understand these instructions. I will adhere to these goals and the provided educational materials after my discharge from the hospital.  Patient/Caregiver Signature _______________________________ Date __________  Clinician Signature _______________________________________ Date __________  Please bring this form and your medication list with you to all your follow-up doctor's appointments.

## 2018-04-26 NOTE — ED Triage Notes (Signed)
Pt arrived via EMS; per EMS, pt just left [facility] this am for recent amputation to RLE. Pt's husband called EMS due to increased temp. EMS states that there was no mental status changes; T 103; BP 152/92; 18 R; 108 P; 95% on RA; CBG 286.

## 2018-04-26 NOTE — Progress Notes (Signed)
[place] PHYSICAL MEDICINE & REHABILITATION     PROGRESS NOTE    Subjective/Complaints: Pt up in w/c. Anxious to get home. Pain under fair control  ROS: Patient denies fever, rash, sore throat, blurred vision, nausea, vomiting, diarrhea, cough, shortness of breath or chest pain, joint or back pain, headache, or mood change.   Objective: Vital Signs: Blood pressure 128/71, pulse 95, temperature 97.9 F (36.6 C), temperature source Oral, resp. rate 18, height 5\' 5"  (1.651 m), weight 93.7 kg (206 lb 9.1 oz), SpO2 97 %. No results found. Recent Labs    04/24/18 0512 04/26/18 0653  WBC 11.2* 9.1  HGB 10.9* 11.0*  HCT 33.9* 34.5*  PLT 325 333   No results for input(s): NA, K, CL, GLUCOSE, BUN, CREATININE, CALCIUM in the last 72 hours.  Invalid input(s): CO CBG (last 3)  Recent Labs    04/25/18 1627 04/25/18 2116 04/26/18 0634  GLUCAP 178* 91 131*    Wt Readings from Last 3 Encounters:  04/26/18 93.7 kg (206 lb 9.1 oz)  04/13/18 96 kg (211 lb 10.3 oz)  03/19/18 90.3 kg (199 lb)    Physical Exam:  Constitutional: No distress . Vital signs reviewed. HEENT: EOMI, oral membranes moist Neck: supple Cardiovascular: RRR without murmur. No JVD    Respiratory: CTA Bilaterally without wheezes or rales. Normal effort    GI: BS +, non-tender, non-distended   Musculoskeletal:stump tender Neurological: She isalertand oriented to person, place, and time.  She is at cognitive baseline.  Motor 5/5 UE.   RLE 3/5. LLE 4/5 Skin: Skin iswarmand dry. Callus on left first MTP, old wound  - ongoing bruising, small area of bruising between 3rd/4th toes. Right stump incision intact, minimal drainage. Distal edema of stump, underlying hematoma at central aspect of wound Psychiatric:  anxious    Assessment/Plan: 1. Functional deficits secondary to right BKA which require 3+ hours per day of interdisciplinary therapy in a comprehensive inpatient rehab setting. Physiatrist is providing  close team supervision and 24 hour management of active medical problems listed below. Physiatrist and rehab team continue to assess barriers to discharge/monitor patient progress toward functional and medical goals.  Function:  Bathing Bathing position   Position: Wheelchair/chair at sink  Bathing parts Body parts bathed by patient: Right arm, Left arm, Chest, Abdomen, Front perineal area, Buttocks, Right upper leg, Left upper leg, Left lower leg Body parts bathed by helper: Back  Bathing assist Assist Level: More than reasonable time   Set up : To obtain items  Upper Body Dressing/Undressing Upper body dressing   What is the patient wearing?: Pull over shirt/dress     Pull over shirt/dress - Perfomed by patient: Thread/unthread right sleeve, Thread/unthread left sleeve, Put head through opening, Pull shirt over trunk          Upper body assist Assist Level: More than reasonable time   Set up : To obtain clothing/put away  Lower Body Dressing/Undressing Lower body dressing   What is the patient wearing?: Shoes, Non-skid slipper socks, Pants, Underwear Underwear - Performed by patient: Thread/unthread right underwear leg, Thread/unthread left underwear leg, Pull underwear up/down   Pants- Performed by patient: Thread/unthread right pants leg, Thread/unthread left pants leg, Pull pants up/down   Non-skid slipper socks- Performed by patient: Don/doff left sock   Socks - Performed by patient: Don/doff left sock   Shoes - Performed by patient: Don/doff left shoe, Fasten left            Lower body  assist Assist for lower body dressing: More than reasonable time      Toileting Toileting   Toileting steps completed by patient: Adjust clothing prior to toileting, Adjust clothing after toileting, Performs perineal hygiene Toileting steps completed by helper: Adjust clothing prior to toileting, Adjust clothing after toileting Toileting Assistive Devices: Grab bar or rail   Toileting assist Assist level: More than reasonable time   Transfers Chair/bed transfer   Chair/bed transfer method: Squat pivot Chair/bed transfer assist level: No Help, no cues, assistive device, takes more than a reasonable amount of time Chair/bed transfer assistive device: Medical sales representative     Max distance: 4 Assist level: Touching or steadying assistance (Pt > 75%)   Wheelchair   Type: Manual Max wheelchair distance: 149ft  Assist Level: Supervision or verbal cues  Cognition Comprehension Comprehension assist level: Understands complex 90% of the time/cues 10% of the time  Expression Expression assist level: Expresses complex 90% of the time/cues < 10% of the time  Social Interaction Social Interaction assist level: Interacts appropriately 90% of the time - Needs monitoring or encouragement for participation or interaction.  Problem Solving Problem solving assist level: Solves basic 90% of the time/requires cueing < 10% of the time  Memory Memory assist level: Recognizes or recalls 75 - 89% of the time/requires cueing 10 - 24% of the time   Medical Problem List and Plan: 1.Functional deficitssecondary to right BKA -dc home today   -follow up with ortho for wound and prosthetic needs   -I will not schedule follow up with me 2. DVT Prophylaxis/Anticoagulation: Pharmaceutical:Lovenox 3. Pain Management:oxycodone prn. gabapentin 4. 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      5. Neuropsych: This patientiscapable of making decisions on herown behalf. 6. Skin/Wound Care:Monitor wound for healing. Routine pressure relief measures.   - shrinker, guard---will need surgical follow up of wound  -stressed to patient that she needs to protect area and keep dry/covered  - continue dry dressing/padding of  left 1st mtp, darco shoe for transfers    7. Fluids/Electrolytes/Nutrition:Monitor I/O.   - .    8.T2DM: Monitor BS ac/hs.   SSI.      -pt with extremely poor insight re: diabetes. Unfortunately she's going to go home and do whatever she wants to do from a diet standpoint, and that's unfortunate given her situation and the amount of education which has been provided during this hospital admit  -Increased mealtime coverage to 7 units on 4/29---   -increased lantus to 28u daily  -improved  control  9. Step agalactiae sepsis: abx completed 10. Bipolar disorder/Personality disorder: On Seroquel at bedtime. 11. Leukocytosis:     -low grade temp yesterday  -recheck ua negative/ucx pending  -Wound healing may be an issue, but wound is not infected.  -wbc's down to 9.2 today  LOS (Days) [place] EVALUATION WAS PERFORMED  Meredith Staggers, MD 04/26/2018 10:03 AM

## 2018-04-26 NOTE — H&P (Signed)
History and Physical    [name] EUM:[id number] DOB: [birth year] DOA: 04/26/2018  PCP: Tamsen Roers, MD   Patient coming from: Home.  I have personally briefly reviewed patient's old medical records in [place]  Chief Complaint: Fever.  HPI: [name] is a 52 y.o. 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  Denies significant fatigue or malaise, sore throat, chest pain, palpitations, dyspnea, PND, orthopnea, abdominal pain, nausea, emesis, diarrhea, melena or hematochezia.  No dysuria, frequency or hematuria.  No polyuria, polydipsia or polyphagia (states that her blood glucose is very well controlled recently).  Denies pruritus.  ED Course: Initial vital signs temperature 99.5 F, pulse 112, respiration 18, blood pressure 156/70 and O2 sat 99% on room air.  The patient received a 500 mL NS bolus, morphine 4 mg IVP, vancomycin and cefazolin in the emergency department.  Her white count was 24.3 with 83% neutrophils, 12% lymphocytes and 3% monocytes.  Hemoglobin 11.7 g/dL and platelets 350.  Sodium 134, potassium 3.9, chloride 94 and CO2 24 mmol/L.  Glucose 209 mg/dL.  Albumin 2.9 g/dL.  All other values are within normal limits. First lactic acid was 4.62 and follow-up 3.77 mmol/L.  The patient was only given 500 mL bolus due to history of combined diastolic/systolic CHF.  Her chest radiograph did not show any acute cardiopulmonary disease.  Right tibia and fibula shows surgical changes of the right BKA, but no evidence of subcutaneous emphysema or unexpected bony  changes.  Please see images and full radiology report for further detail.  Review of Systems: As per HPI otherwise 10 point review of systems negative.   Past Medical History:  Diagnosis Date  . Anxiety   . Arthritis   . Bipolar disorder ([place])   . Broken jaw ([place])   . Broken wrist   . Chronic back pain   . Claudication ([place])    a. 12/2013 ABI's: R 0.97, L 0.94.  [name] COPD (chronic obstructive pulmonary disease) ([place])   . Depression   . Diabetic Charcot's foot ([place])   . Diabetic foot ulcer (HCC)    chronic left foot ulcer , great toe/  hx recurrent foot ulcer bilaterally  . DJD (degenerative joint disease)   . Edema of both lower extremities   . Episode of memory loss   . GERD (gastroesophageal reflux disease)   . Headache(784.0)   . Hiatal hernia   . History of colon cancer, stage I dx 02-26-2015--- oncologist-- dr [name]--- per last note no recurrence   05-16-2015 s/p  Laparoscopic sigmoid colectomy w/ node bx's (negative nodes per path)  Stage I (pT1,N0,M0) Grade 2  . 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   per MRI multiple small strokes post cardiac cath. --  residual mild memory loss  . History of methicillin resistant staphylococcus aureus (MRSA)   . Hypercholesterolemia   . Insomnia   . Insulin dependent type 2 diabetes mellitus, uncontrolled ([place]) dx 2004   endocrinologist-  dr Dwyane Dee-- last A1c 8.8 in Aug2018:  pt is noncompliant w/ diet, states does note eat breakfast , her first main meal in afternoon  .  Neurogenic bladder   . Neuropathy, diabetic ([place])    hands and feet  . OSA (obstructive sleep apnea)    cpap intolerant  . Personality disorder ([place])   . SOB (shortness of breath) on exertion   . Stroke ([place])   . SUI (stress urinary incontinence, female) S/P SLING 12-29-2011    Past Surgical History:  Procedure Laterality Date  . ABDOMINAL HYSTERECTOMY    . AMPUTATION Right 04/13/2018   Procedure: RIGHT BELOW KNEE AMPUTATION;   Surgeon: Newt Minion, MD;  Location: [facility];  Service: Orthopedics;  Laterality: Right;  . ANTERIOR CERVICAL DECOMP/DISCECTOMY FUSION  2000   C5 - 7  . CARDIAC CATHETERIZATION  05-22-2008   DR SKAINS   NO SIGNIFECANT CAD/ NORMAL LV/  EF 65%/  NO WALL MOTION ABNORMALITIES  . CARPAL TUNNEL RELEASE Right 04-25-2013  . [place]; 1992  . COLONOSCOPY    . CYSTO N/A 04/30/2013   Procedure: CYSTOSCOPY;  Surgeon: Reece Packer, MD;  Location: [facility];  Service: Urology;  Laterality: N/A;  . CYSTOSCOPY MACROPLASTIQUE IMPLANT N/A 02/06/2018   Procedure: CYSTOSCOPY MACROPLASTIQUE IMPLANT;  Surgeon: Bjorn Loser, MD;  Location: [facility];  Service: Urology;  Laterality: N/A;  . CYSTOSCOPY WITH INJECTION  05/04/2012   Procedure: CYSTOSCOPY WITH INJECTION;  Surgeon: Reece Packer, MD;  Location: [facility];  Service: Urology;  Laterality: N/A;  MACROPLASTIQUE INJECTION  . CYSTOSCOPY WITH INJECTION  08/28/2012   Procedure: CYSTOSCOPY WITH INJECTION;  Surgeon: Reece Packer, MD;  Location[facility];  Service: Urology;  Laterality: N/A;  cysto and macroplastique   . FOOT SURGERY Bilateral   . HERNIA REPAIR  ?1996   "stomach"  . INCISION AND DRAINAGE OF WOUND Right 04/11/2018   Procedure: IRRIGATION AND DEBRIDEMENT WOUND right foot and right ankle;  Surgeon: Evelina Bucy, DPM;  Location: [facility];  Service: Podiatry;  Laterality: Right;  . KNEE ARTHROSCOPY W/ ALLOGRAFT IMPANT Left    graft x 2  . KNEE SURGERY     TOTAL 8 SURG'S  . LAPAROSCOPIC SIGMOID COLECTOMY N/A 04/22/2015   Procedure: LAPAROSCOPIC HAND ASSISTED SIGMOID COLECTOMY;  Surgeon: Erroll Luna, MD;  Location: [facility];  Service: General;  Laterality: N/A;  . LEFT HEART CATHETERIZATION WITH CORONARY ANGIOGRAM N/A 02/12/2014   Procedure: LEFT HEART CATHETERIZATION WITH CORONARY ANGIOGRAM;  Surgeon: Candee Furbish, MD;  Location[facility];  Service: Cardiovascular;   Laterality: N/A;   No angiographically significant CAD; normal LVSF, LVEDP 70mmHg,  EF 55% (new finding ef 30% myoview 01-08-2014)  . LUMBAR FUSION    . MANDIBLE FRACTURE SURGERY    . MULTIPLE LAPAROSCOPIES FOR ENDOMETRIOSIS    . PUBOVAGINAL SLING  12/29/2011   Procedure: Gaynelle Arabian;  Surgeon: Reece Packer, MD;  [facility];  Service: Urology;  Laterality: N/A;  cysto and sparc sling   . PUBOVAGINAL SLING N/A 04/30/2013   Procedure: REMOVAL OF VAGINAL MESH;  Surgeon: Reece Packer, MD;  Location[facility];  Service: Urology;  Laterality: N/A;  . RECONSTURCTION OF CONGENITAL UTERUS ANOMALY  1983  . REPEAT RECONSTRUCTION ACL LEFT KNEE/ SCREWS REMOVED  03-28-2000   CADAVER GRAFT  . TOTAL ABDOMINAL HYSTERECTOMY W/ BILATERAL SALPINGOOPHORECTOMY  1997  . TRANSTHORACIC ECHOCARDIOGRAM  02/13/2014   ef 45%, hypokinesis base inferior and base inferolateral walls  . UPPER GI ENDOSCOPY    . WOUND DEBRIDEMENT Right 09/05/2016   Procedure: DEBRIDEMENT WOUND WITH GRAFT  RIGHT FOOT;  Surgeon: Landis Martins, DPM;  Location: [facility];  Service: Podiatry;  Laterality: Right;  . WRIST FRACTURE SURGERY       reports that she has been smoking cigarettes.  She has a 180.00 pack-year smoking history. She has never used smokeless tobacco. She reports that she does not drink alcohol or use drugs.  Allergies  Allergen Reactions  . Latex Itching, Rash and Other (See Comments)    Pt states she cannot use condoms - cause an infection.  Use of latex on skin is okay.  Tape causes rash  . Sweetening Enhancer [Flavoring Agent] Nausea And Vomiting and Other (See Comments)    HEADACHES  . Aspartame And Phenylalanine Nausea And Vomiting    HEADACHES  . Ibuprofen Other (See Comments)    HEADACHES  . Trazodone And Nefazodone Other (See Comments)    Hallucinations   . Triazolam Other (See Comments)    HALLUCINATIONS    Family History  Problem Relation Age of Onset  . Hypertension  Mother   . Diabetes Mother   . Cancer - Other Mother        lymphoma   . Cancer - Other Father        lung, bladder cancer   . Heart attack Father   . Cancer - Other Brother        bladder cancer     Prior to Admission medications   Medication Sig Start Date End Date Taking? Authorizing Provider  acetaminophen (TYLENOL) 325 MG tablet Take 1-2 tablets (325-650 mg total) by mouth every 4 (four) hours as needed for mild pain. 04/23/18  Yes Love, Ivan Anchors, PA-C  bisacodyl (DULCOLAX) 5 MG EC tablet Take 1 tablet (5 mg total) by mouth daily as needed for moderate constipation. 04/16/18  Yes Elgergawy, Silver Huguenin, MD  clonazePAM (KLONOPIN) 0.5 MG tablet Take 1 tablet (0.5 mg total) by mouth 3 (three) times daily as needed (anxiety). 04/26/18  Yes Love, Ivan Anchors, PA-C  docusate sodium (COLACE) 100 MG capsule Take 1 capsule (100 mg total) by mouth 2 (two) times daily. 04/16/18  Yes Elgergawy, Silver Huguenin, MD  gabapentin (NEURONTIN) 600 MG tablet TAKE 1 TABLET (600 MG TOTAL) BY MOUTH 2 (TWO) TIMES DAILY. 01/24/18  Yes Patel, Donika K, DO  glucose blood (FREESTYLE LITE) test strip Use to check blood sugars up to 4 times daily. Dx Code E11.65 03/16/18  Yes Elayne Snare, MD  insulin glargine (LANTUS) 100 UNIT/ML injection Inject 0.28 mLs (28 Units total) into the skin daily before breakfast. 04/26/18  Yes Love, Ivan Anchors, PA-C  lidocaine (LIDODERM) 5 % Place 1 patch onto the skin daily as needed (pain). Remove & Discard patch within 12 hours or as directed by MD 02/27/18  Yes Landis Martins, DPM  Melatonin 10 MG CAPS Take 10 mg by mouth at bedtime.    Yes [provider]  methocarbamol (ROBAXIN) 500 MG tablet Take 1 tablet (500 mg total) by mouth every 6 (six) hours as needed for muscle spasms. 04/26/18  Yes Love, Ivan Anchors, PA-C  nicotine (NICODERM CQ - DOSED IN MG/24 HOURS) 21 mg/24hr patch Place 1 patch (21 mg total) onto the skin daily. 04/17/18  Yes Elgergawy, Silver Huguenin, MD  nortriptyline (PAMELOR) 25 MG capsule  Take 1 capsule (25 mg total) by mouth at bedtime. 04/26/18  Yes Love, Ivan Anchors, PA-C  ondansetron (ZOFRAN) 4 MG tablet Take 1 tablet (4 mg total) by mouth every 6 (six) hours as needed for  nausea. 04/16/18  Yes Elgergawy, Silver Huguenin, MD  oxyCODONE 10 MG TABS Take 1 tablet (10 mg total) by mouth every 6 (six) hours as needed for moderate pain (pain score 4-6). 04/26/18  Yes Love, Ivan Anchors, PA-C  pantoprazole (PROTONIX) 40 MG tablet Take 1 tablet (40 mg total) by mouth daily. 04/27/18  Yes Love, Ivan Anchors, PA-C  QUEtiapine (SEROQUEL) 300 MG tablet Take 2 tablets (600 mg total) by mouth at bedtime. 04/23/18  Yes Love, Ivan Anchors, PA-C  rOPINIRole (REQUIP) 2 MG tablet Take 1 tablet (2 mg total) by mouth daily. 04/26/18  Yes LoveIvan Anchors, PA-C    Physical Exam: Vitals:   04/26/18 1920  BP: (!) 156/70  Pulse: (!) 112  Resp: 18  Temp: 99.5 F (37.5 C)  TempSrc: Oral  SpO2: 99%    Constitutional: NAD, calm, comfortable Eyes: PERRL, lids and conjunctivae normal ENMT: Mucous membranes are mildly dry. Posterior pharynx clear of any exudate or lesions.  Neck: normal, supple, no masses, no thyromegaly Respiratory: Clear to auscultation bilaterally, no wheezing, no crackles. Normal respiratory effort. No accessory muscle use.  Cardiovascular: Tachycardic at 104 bpm, no murmurs / rubs / gallops. No extremity edema. 2+ pedal pulses. No carotid bruits.  Abdomen: Soft, no tenderness, no masses palpated. No hepatosplenomegaly. Bowel sounds positive.  Musculoskeletal: no clubbing / cyanosis.  Right BKA.. Good ROM, no contractures. Normal muscle tone.  Skin: Positive erythema, mild tenderness and mild edema of surgical wound.  See pictures below. Neurologic: CN 2-12 grossly intact. Sensation intact, DTR normal. Strength 5/5 in all 4.  Psychiatric: Normal judgment and insight. Alert and oriented x 4.  Mildly anxious (the patient has been continuously tapping with her left fingers during interview and  examination).        Labs on Admission: I have personally reviewed following labs and imaging studies  CBC: Recent Labs  Lab 04/23/18 0513 04/24/18 0512 04/26/18 0653 04/26/18 1936  WBC 16.2* 11.2* 9.1 24.3*  NEUTROABS  --   --   --  20.3*  HGB 10.8* 10.9* 11.0* 11.7*  HCT 34.2* 33.9* 34.5* 35.6*  MCV 94.5 93.1 93.5 93.2  PLT 340 325 333 979   Basic Metabolic Panel: Recent Labs  Lab 04/23/18 0513 04/26/18 1936  NA 134* 134*  K 4.2 3.9  CL 101 94*  CO2 24 24  GLUCOSE 238* 209*  BUN 9 11  CREATININE 0.70 0.73  CALCIUM 8.9 9.1   GFR: Estimated Creatinine Clearance: 93.1 mL/min (by C-G formula based on SCr of 0.73 mg/dL). Liver Function Tests: Recent Labs  Lab 04/26/18 1936  AST 17  ALT 16  ALKPHOS 97  BILITOT 0.4  PROT 7.0  ALBUMIN 2.9*   No results for input(s): LIPASE, AMYLASE in the last 168 hours. No results for input(s): AMMONIA in the last 168 hours. Coagulation Profile: No results for input(s): INR, PROTIME in the last 168 hours. Cardiac Enzymes: No results for input(s): CKTOTAL, CKMB, CKMBINDEX, TROPONINI in the last 168 hours. BNP (last 3 results) No results for input(s): PROBNP in the last 8760 hours. HbA1C: No results for input(s): HGBA1C in the last 72 hours. CBG: Recent Labs  Lab 04/25/18 0637 04/25/18 1130 04/25/18 1627 04/25/18 2116 04/26/18 0634  GLUCAP 110* 108* 178* 91 131*   Lipid Profile: No results for input(s): CHOL, HDL, LDLCALC, TRIG, CHOLHDL, LDLDIRECT in the last 72 hours. Thyroid Function Tests: No results for input(s): TSH, T4TOTAL, FREET4, T3FREE, THYROIDAB in the last 72 hours. Anemia Panel:  No results for input(s): VITAMINB12, FOLATE, FERRITIN, TIBC, IRON, RETICCTPCT in the last 72 hours. Urine analysis:    Component Value Date/Time   COLORURINE STRAW (A) 04/25/2018 2217   APPEARANCEUR CLEAR 04/25/2018 2217   LABSPEC 1.004 (L) 04/25/2018 2217   PHURINE 6.0 04/25/2018 2217   GLUCOSEU NEGATIVE 04/25/2018  2217   GLUCOSEU >=1000 (A) 01/07/2015 1410   HGBUR NEGATIVE 04/25/2018 2217   [place] NEGATIVE 04/25/2018 2217   [place] 04/25/2018 2217   PROTEINUR NEGATIVE 04/25/2018 2217   UROBILINOGEN 0.2 04/23/2015 2355   NITRITE NEGATIVE 04/25/2018 2217   LEUKOCYTESUR SMALL (A) 04/25/2018 2217    Radiological Exams on Admission: Dg Chest 2 View  Result Date: 04/26/2018 CLINICAL DATA:  52 year old female with fever status post recent amputation EXAM: CHEST - 2 VIEW COMPARISON:  Prior chest x-ray 04/09/2018 FINDINGS: The lungs are clear and negative for focal airspace consolidation, pulmonary edema or suspicious pulmonary nodule. No pleural effusion or pneumothorax. Cardiac and mediastinal contours are within normal limits. No acute fracture or lytic or blastic osseous lesions. The visualized upper abdominal bowel gas pattern is unremarkable. Incompletely imaged anterior cervical fusion hardware. IMPRESSION: No active cardiopulmonary disease. Electronically Signed   By: Jacqulynn Cadet M.D.   On: 04/26/2018 21:14   Dg Tibia/fibula Right  Result Date: 04/26/2018 CLINICAL DATA:  52 year old female with fever. Recent right below-the-knee amputation EXAM: RIGHT TIBIA AND FIBULA - 2 VIEW COMPARISON:  None. FINDINGS: Surgical changes of below the knee amputation. Surgical staples are present. The osteotomy sites are well-marginated. No evidence of subcutaneous emphysema. No conventional radiographic evidence of osteomyelitis. Mild soft tissue edema at the stump, not unexpected postoperative period. IMPRESSION: 1. Surgical changes of right below-the-knee amputation with expected postoperative edema. 2. No evidence of subcutaneous emphysema or unexpected bony changes. Electronically Signed   By: Jacqulynn Cadet M.D.   On: 04/26/2018 21:13   Echocardiogram 04/13/2018 ------------------------------------------------------------------- LV EF: 45% -    50%  ------------------------------------------------------------------- History:   PMH:  CVA. Fever 780.6.  Chronic obstructive pulmonary disease.  Risk factors:  Current tobacco use. Dyslipidemia.  ------------------------------------------------------------------- Study Conclusions  - Left ventricle: The cavity size was normal. Systolic function was   mildly reduced. The estimated ejection fraction was in the range   of 45% to 50%. Hypokinesis of the basalinferolateral, inferior,   and inferoseptal myocardium; consistent with ischemia in the   distribution of the right coronary artery; unchanged from the   previous study. Features are consistent with a pseudonormal left   ventricular filling pattern, with concomitant abnormal relaxation   and increased filling pressure (grade 2 diastolic dysfunction). - Pulmonary arteries: PA peak pressure: 31 mm Hg (S).   EKG: Independently reviewed.  Assessment/Plan Principal Problem:   Postoperative cellulitis of surgical wound Admit to telemetry/inpatient. Supplemental oxygen as needed. Continue vancomycin per pharmacy. Zosyn per pharmacy. Oxycodone 4 mg every 4 hours as needed for pain. Tight blood glucose control. Orthopedic surgery to evaluate in a.m.  Active Problems:   Type II diabetes mellitus, uncontrolled ([place]) The patient declined the carbohydrate modified diet due to artificial sweeteners allergies. Continue Lantus 28 units SQ daily. CBG monitoring with regular insulin sliding scale.    Depression with anxiety Continue clonazepam 0.5 mg p.o. as needed. Continue nortriptyline 25 mg p.o. at bedtime. Continue Seroquel 600 mg at bedtime.    Essential hypertension Not on antihypertensive. Monitor blood pressure.    Diabetic polyneuropathy (HCC) Continue gabapentin 600 mg p.o. twice daily. Continue nortriptyline at bedtime.  Hyperlipemia Not on medical therapy. To be discussed with her PCP at her earliest  convenience.    OSA (obstructive sleep apnea) Not on CPAP. Declined CPAP use.    Hyponatremia Continue normal saline infusion. Follow-up sodium level.    Restless leg syndrome Continue Requip 2 mg p.o. daily.   DVT prophylaxis: Lovenox SQ. Code Status: Full code. Family Communication:  Disposition Plan: Admit for IV antibiotic therapy for several days and orthopedic surgery evaluation. Consults called: Dr. Sharol Given was consulted by the ED.  Will evaluate the patient in a.m. Admission status: Inpatient/telemetry.   Reubin Milan MD [facility] Pager [phone].  If 7PM-7AM, please contact night-coverage [email or web address] Password [email or web address]  04/26/2018, 11:00 PM

## 2018-04-26 NOTE — Progress Notes (Signed)
Social Work Discharge Note  The overall goal for the admission was met for:   Discharge location: Yes - home with spouse who can provide 24/7 supervision  Length of Stay: Yes - 10 days  Discharge activity level: Yes - supervision  Home/community participation: Yes  Services provided included: MD, RD, PT, OT, RN, TR, Pharmacy, [place]: Medicare and Medicaid  Follow-up services arranged: Home Health: RN, PT, OT via [facility], DME: 18x18 lightweight w/c with right amputee support pad, cushion, walker, drop arm commode via [place] and Patient/Family has no preference for HH/DME agencies  Comments (or additional information):  Patient/Family verbalized understanding of follow-up arrangements: Yes  Individual responsible for coordination of the follow-up plan: pt  Confirmed correct DME delivered: Ryelle Ruvalcaba 04/26/2018    Adrian Dinovo

## 2018-04-27 ENCOUNTER — Telehealth (INDEPENDENT_AMBULATORY_CARE_PROVIDER_SITE_OTHER): Payer: Self-pay | Admitting: Orthopedic Surgery

## 2018-04-27 ENCOUNTER — Encounter (HOSPITAL_COMMUNITY): Payer: Self-pay | Admitting: Internal Medicine

## 2018-04-27 DIAGNOSIS — G2581 Restless legs syndrome: Secondary | ICD-10-CM | POA: Diagnosis present

## 2018-04-27 DIAGNOSIS — R6510 Systemic inflammatory response syndrome (SIRS) of non-infectious origin without acute organ dysfunction: Secondary | ICD-10-CM

## 2018-04-27 DIAGNOSIS — F419 Anxiety disorder, unspecified: Secondary | ICD-10-CM | POA: Diagnosis present

## 2018-04-27 DIAGNOSIS — E871 Hypo-osmolality and hyponatremia: Secondary | ICD-10-CM

## 2018-04-27 LAB — CBC WITH DIFFERENTIAL/PLATELET
Basophils Absolute: 0.1 10*3/uL (ref 0.0–0.1)
Basophils Relative: 1 %
Eosinophils Absolute: 0.4 10*3/uL (ref 0.0–0.7)
Eosinophils Relative: 2 %
HCT: 33.9 % — ABNORMAL LOW (ref 36.0–46.0)
Hemoglobin: 11 g/dL — ABNORMAL LOW (ref 12.0–15.0)
LYMPHS ABS: 3.5 10*3/uL (ref 0.7–4.0)
Lymphocytes Relative: 24 %
MCH: 30.1 pg (ref 26.0–34.0)
MCHC: 32.4 g/dL (ref 30.0–36.0)
MCV: 92.6 fL (ref 78.0–100.0)
Monocytes Absolute: 0.5 10*3/uL (ref 0.1–1.0)
Monocytes Relative: 4 %
Neutro Abs: 10.3 10*3/uL — ABNORMAL HIGH (ref 1.7–7.7)
Neutrophils Relative %: 69 %
Platelets: 339 10*3/uL (ref 150–400)
RBC: 3.66 MIL/uL — AB (ref 3.87–5.11)
RDW: 13.7 % (ref 11.5–15.5)
WBC: 14.8 10*3/uL — AB (ref 4.0–10.5)

## 2018-04-27 LAB — GLUCOSE, CAPILLARY
GLUCOSE-CAPILLARY: 188 mg/dL — AB (ref 65–99)
GLUCOSE-CAPILLARY: 303 mg/dL — AB (ref 65–99)
GLUCOSE-CAPILLARY: 329 mg/dL — AB (ref 65–99)
Glucose-Capillary: 240 mg/dL — ABNORMAL HIGH (ref 65–99)

## 2018-04-27 LAB — BASIC METABOLIC PANEL
Anion gap: 8 (ref 5–15)
BUN: 7 mg/dL (ref 6–20)
CALCIUM: 9 mg/dL (ref 8.9–10.3)
CO2: 30 mmol/L (ref 22–32)
CREATININE: 0.61 mg/dL (ref 0.44–1.00)
Chloride: 101 mmol/L (ref 101–111)
GFR calc Af Amer: >60 mL/min (ref >60)
GFR calc non Af Amer: >60 mL/min (ref >60)
Glucose, Bld: 150 mg/dL — ABNORMAL HIGH (ref 65–99)
POTASSIUM: 3.7 mmol/L (ref 3.5–5.1)
SODIUM: 139 mmol/L (ref 135–145)

## 2018-04-27 LAB — URINE CULTURE: Culture: NO GROWTH

## 2018-04-27 LAB — LACTIC ACID, PLASMA: Lactic Acid, Venous: 1.9 mmol/L (ref 0.5–1.9)

## 2018-04-27 MED ORDER — INSULIN ASPART 100 UNIT/ML ~~LOC~~ SOLN
0.0000 [IU] | Freq: Three times a day (TID) | SUBCUTANEOUS | Status: DC
  Administered 2018-04-27: 3 [IU] via SUBCUTANEOUS
  Administered 2018-04-27: 7 [IU] via SUBCUTANEOUS
  Administered 2018-04-28: 3 [IU] via SUBCUTANEOUS
  Administered 2018-04-28: 7 [IU] via SUBCUTANEOUS

## 2018-04-27 MED ORDER — PIPERACILLIN-TAZOBACTAM 3.375 G IVPB
3.3750 g | Freq: Three times a day (TID) | INTRAVENOUS | Status: DC
  Administered 2018-04-27 – 2018-04-30 (×9): 3.375 g via INTRAVENOUS
  Filled 2018-04-27 (×10): qty 50

## 2018-04-27 MED ORDER — CLONAZEPAM 1 MG PO TABS
1.0000 mg | ORAL_TABLET | Freq: Three times a day (TID) | ORAL | Status: DC | PRN
  Administered 2018-04-27 – 2018-04-28 (×3): 1 mg via ORAL
  Filled 2018-04-27 (×3): qty 1

## 2018-04-27 MED ORDER — INSULIN ASPART 100 UNIT/ML ~~LOC~~ SOLN
0.0000 [IU] | Freq: Every day | SUBCUTANEOUS | Status: DC
  Administered 2018-04-27: 4 [IU] via SUBCUTANEOUS

## 2018-04-27 MED ORDER — PIPERACILLIN-TAZOBACTAM 3.375 G IVPB 30 MIN
3.3750 g | Freq: Once | INTRAVENOUS | Status: AC
  Administered 2018-04-27: 3.375 g via INTRAVENOUS
  Filled 2018-04-27: qty 50

## 2018-04-27 MED ORDER — SODIUM CHLORIDE 0.9 % IV BOLUS
500.0000 mL | Freq: Once | INTRAVENOUS | Status: AC
  Administered 2018-04-27: 500 mL via INTRAVENOUS

## 2018-04-27 MED ORDER — VANCOMYCIN HCL IN DEXTROSE 1-5 GM/200ML-% IV SOLN
1000.0000 mg | Freq: Two times a day (BID) | INTRAVENOUS | Status: DC
  Administered 2018-04-27 – 2018-04-30 (×7): 1000 mg via INTRAVENOUS
  Filled 2018-04-27 (×7): qty 200

## 2018-04-27 MED ORDER — VANCOMYCIN HCL IN DEXTROSE 1-5 GM/200ML-% IV SOLN
1000.0000 mg | Freq: Three times a day (TID) | INTRAVENOUS | Status: DC
  Filled 2018-04-27: qty 200

## 2018-04-27 NOTE — Telephone Encounter (Signed)
Marlowe Kays with [facility] called to let Dr. Sharol Given know that they were suppose to start up her continuity of care today and the nurse went to the house, knocked several times, called both phone numbers to no avail.  The recall nurse will try again on 04/28/18 to see if she gets an answer.  CB#[phone].  Thank you.

## 2018-04-27 NOTE — Progress Notes (Signed)
Progress Note    [name]  IOX:[id number] DOB: 09/09/1966  DOA: 04/26/2018 PCP: Tamsen Roers, MD    Brief Narrative:   Chief complaint: Follow-up stump infection.  Medical records reviewed and are as summarized below:  [name] is an 52 y.o. 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  Assessment/Plan:   Principal Problem:   Sepsis secondary to Postoperative cellulitis of surgical wound Met sepsis criteria with tachycardia, fever, elevated serum lactate and leukocytosis. Given fluid volume bolus in the ED with normalization of serum lactate levels. Continue empiric vancomycin/Zosyn. Plain films personally reviewed and did not show any evidence of osteomyelitis. WBC 24.3--->14.8. Orthopedic surgery consultation pending.  Active Problems:   Type II diabetes mellitus, uncontrolled ([place]) Currently being managed with Lantus 28 units daily. We'll add insulin sensitive SSI.    Essential hypertension Not on antihypertensives routinely. Systolic blood pressure in the 150s, so may need to consider initiating therapy.    Diabetic polyneuropathy (HCC) Continue Neurontin.    OSA (obstructive sleep apnea) Not on CPAP.    Hyponatremia Resolved.    Restless leg syndrome Continue Requip.    Depression with anxiety/bipolar Continue Seroquel, Pamelor, Klonopin.    Obesity Body mass index is 33.7 kg/m.   Family Communication/Anticipated D/C date and plan/Code Status   DVT prophylaxis: Lovenox ordered. Code Status: Full Code.  Family Communication: No family present at bedside. Disposition Plan: Likely need IV antibiotics through the weekend.   Medical Consultants:     Orthopedic Surgery   Anti-Infectives:    Vancomycin 04/26/18--->  Zosyn 04/26/18--->  Subjective:   Irritable and upset that she is in the hospital, upset with her diet. Wants to go home. Having some pain/discomfort at the right stump, but denies active purulent drainage.  Objective:    Vitals:   04/26/18 1920 04/26/18 2349  BP: (!) 156/70   Pulse: (!) 112   Resp: 18   Temp: 99.5 F (37.5 C)   TempSrc: Oral   SpO2: 99%   Weight:  91.9 kg (202 lb 8 oz)  Height:  '5\' 5"'  (1.651 m)    Intake/Output Summary (Last 24 hours) at 04/27/2018 0857 Last data filed at 04/27/2018 0509 Gross per 24 hour  Intake 1540 ml  Output -  Net 1540 ml   Filed Weights   04/26/18 2349  Weight: 91.9 kg (202 lb 8 oz)    Exam: General: No acute distress. Cardiovascular: Heart sounds are tachycardic with a regular rhythm. No gallops or rubs. No murmurs. No JVD. Lungs: Clear to auscultation bilaterally with good air movement. No rales, rhonchi or wheezes. Abdomen: Soft, nontender, nondistended with normal active bowel sounds. No masses. No hepatosplenomegaly. Neurological: Alert and oriented 3. Moves all extremities 4 with equal strength. Cranial nerves II through XII grossly intact. Skin: Warm and dry. No rashes or lesions. Extremities: Right BKA stump with erythema, staples intact with no dehiscence. Psychiatric: Mood and affect are irritable/angry. Insight and judgment are poor.   Data Reviewed:   I have personally reviewed following labs and imaging studies:  Labs: Labs show the following:   Basic Metabolic Panel: Recent Labs  Lab 04/23/18 0513 04/26/18 1936 04/27/18 0426  NA 134* 134* 139  K 4.2 3.9  3.7  CL 101 94* 101  CO2 '24 24 30  ' GLUCOSE 238* 209* 150*  BUN '9 11 7  ' CREATININE 0.70 0.73 0.61  CALCIUM 8.9 9.1 9.0   GFR Estimated Creatinine Clearance: 92.2 mL/min (by C-G formula based on SCr of 0.61 mg/dL). Liver Function Tests: Recent Labs  Lab 04/26/18 1936   AST 17  ALT 16  ALKPHOS 97  BILITOT 0.4  PROT 7.0  ALBUMIN 2.9*    CBC: Recent Labs  Lab 04/23/18 0513 04/24/18 0512 04/26/18 0653 04/26/18 1936 04/27/18 0426  WBC 16.2* 11.2* 9.1 24.3* 14.8*  NEUTROABS  --   --   --  20.3* 10.3*  HGB 10.8* 10.9* 11.0* 11.7* 11.0*  HCT 34.2* 33.9* 34.5* 35.6* 33.9*  MCV 94.5 93.1 93.5 93.2 92.6  PLT 340 325 333 350 339   CBG: Recent Labs  Lab 04/25/18 1130 04/25/18 1627 04/25/18 2116 04/26/18 0634 04/27/18 0004  GLUCAP 108* 178* 91 131* 188*   Sepsis Labs: Recent Labs  Lab 04/24/18 0512 04/26/18 0653 04/26/18 1936 04/26/18 2015 04/26/18 2317 04/27/18 0426  WBC 11.2* 9.1 24.3*  --   --  14.8*  LATICACIDVEN  --   --   --  4.62* 3.77* 1.9    Microbiology Recent Results (from the past 240 hour(s))  Culture, Urine     Status: Abnormal   Collection Time: 04/21/18  1:52 PM  Result Value Ref Range Status   Specimen Description URINE, CLEAN CATCH  Final   Special Requests NONE  Final   Culture (A)  Final    <10,000 COLONIES/mL INSIGNIFICANT GROWTH Performed at [facility], [address], [place], [place] [address]    Report Status 04/22/2018 FINAL  Final  Culture, Urine     Status: None   Collection Time: 04/25/18  9:05 PM  Result Value Ref Range Status   Specimen Description URINE, RANDOM  Final   Special Requests NONE  Final   Culture   Final    NO GROWTH Performed at [facility], [place] [address] [place], [place] [address]    Report Status 04/27/2018 FINAL  Final    Procedures and diagnostic studies:  Dg Chest 2 View  Result Date: 04/26/2018 CLINICAL DATA:  52 year old female with fever status post recent amputation EXAM: CHEST - 2 VIEW COMPARISON:  Prior chest x-ray 04/09/2018 FINDINGS: The lungs are clear and negative for focal airspace consolidation, pulmonary edema or suspicious pulmonary nodule. No pleural effusion or pneumothorax. Cardiac and mediastinal contours are within normal limits. No  acute fracture or lytic or blastic osseous lesions. The visualized upper abdominal bowel gas pattern is unremarkable. Incompletely imaged anterior cervical fusion hardware. IMPRESSION: No active cardiopulmonary disease. Electronically Signed   By: Jacqulynn Cadet M.D.   On: 04/26/2018 21:14   Dg Tibia/fibula Right  Result Date: 04/26/2018 CLINICAL DATA:  52 year old female with fever. Recent right below-the-knee amputation EXAM: RIGHT TIBIA AND FIBULA - 2 VIEW COMPARISON:  None. FINDINGS: Surgical changes of below the knee amputation. Surgical staples are present. The osteotomy sites are well-marginated. No evidence of subcutaneous emphysema. No conventional radiographic evidence of osteomyelitis. Mild soft tissue edema at the stump, not unexpected postoperative period. IMPRESSION: 1. Surgical changes of right below-the-knee amputation with expected postoperative edema. 2. No evidence of subcutaneous emphysema or unexpected bony changes. Electronically Signed   By: Jacqulynn Cadet M.D.   On: 04/26/2018 21:13    Medications:   . docusate sodium  100 mg Oral BID  .  enoxaparin (LOVENOX) injection  40 mg Subcutaneous Q24H  . gabapentin  600 mg Oral BID  . insulin glargine  28 Units Subcutaneous QAC breakfast  . nortriptyline  25 mg Oral QHS  . pantoprazole  40 mg Oral Daily  . QUEtiapine  600 mg Oral QHS  . rOPINIRole  2 mg Oral Daily   Continuous Infusions: . piperacillin-tazobactam (ZOSYN)  IV    . vancomycin 1,000 mg (04/27/18 0517)     LOS: 1 day   Jacquelynn Cree  [facility] Pager [phone]. If unable to reach me by pager, please call my cell phone at [phone].  *Please refer to [email or web address], password [email or web address] to get updated schedule on who will round on this patient, as hospitalists switch teams weekly. If 7PM-7AM, please contact night-coverage at [email or web address], password [email or web address] for any overnight needs.  04/27/2018, 8:57 AM

## 2018-04-27 NOTE — Progress Notes (Signed)
Pt arrived on unit in no acute distress.  She is A/O X 4.  Welcome and introduction to unit completed.  Assessment, vital signs, and telemetry placement completed.  She is in bed resting.  Will continue to monitor.

## 2018-04-27 NOTE — Progress Notes (Signed)
Pharmacy Antibiotic Note  [name] is a 52 y.o. female admitted on 04/26/2018 infection on the same day she was discharged from the hospital. Approximately 2 weeks ago the patient was here with Group B strep bacteremia from bilateral foot infection and outpatient parenteral antibiotics were ordered through 5/16. Pharmacy has been consulted for empiric therapy with vancomycin and Zosyn.  Plan: - Vancomycin 1000mg  IV q12hour (Goal trough 10-15) - Zosyn 3.375gm IV q8h (each dose infused over 4 hrs) - Monitor daily SCr while on Vanc and Zosyn - Follow culture results and sensitivities - Check vancomycin levels as appropriate   Height: 5\' 5"  (165.1 cm) Weight: 202 lb 8 oz (91.9 kg) IBW/kg (Calculated) : 57  Temp (24hrs), Avg:99 F (37.2 C), Min:98.5 F (36.9 C), Max:99.5 F (37.5 C)  Recent Labs  Lab 04/23/18 0513 04/24/18 0512 04/26/18 0653 04/26/18 1936 04/26/18 2015 04/26/18 2317  WBC 16.2* 11.2* 9.1 24.3*  --   --   CREATININE 0.70  --   --  0.73  --   --   LATICACIDVEN  --   --   --   --  4.62* 3.77*    Estimated Creatinine Clearance: 92.2 mL/min (by C-G formula based on SCr of 0.73 mg/dL).    Allergies  Allergen Reactions  . Latex Itching, Rash and Other (See Comments)    Pt states she cannot use condoms - cause an infection.  Use of latex on skin is okay.  Tape causes rash  . Sweetening Enhancer [Flavoring Agent] Nausea And Vomiting and Other (See Comments)    HEADACHES  . Aspartame And Phenylalanine Nausea And Vomiting    HEADACHES  . Ibuprofen Other (See Comments)    HEADACHES  . Trazodone And Nefazodone Other (See Comments)    Hallucinations   . Triazolam Other (See Comments)    HALLUCINATIONS   Antimicrobials this admission: Vancomycin 5/3>> Zosyn 5/3>>  Dose adjustments this admission:  Microbiology results: 4/15 BCx: 1/4 bottles GPC (BCID = Grp B strep) 4/15 UCx: mult sp-final 4/17 Surg PCR: MRSA PCR + 4/17 R foot wound: abundant GPC 4/17 R  foot fungal stain: sent 4/17 R foot Bone: sent 4/17 R foot Bone fungal stain: sent 4/18 BCx: sent Thank you for allowing pharmacy to be a part of this patient's care.  Georga Bora, PharmD Clinical Pharmacist 04/27/2018 2:57 AM

## 2018-04-27 NOTE — Consult Note (Addendum)
ORTHOPAEDIC CONSULTATION  REQUESTING PHYSICIAN: Rama, Venetia Maxon, MD  Chief Complaint: Cellulitis right transtibial amputation.  HPI: [name] is a 52 y.o. female who presents with cellulitis right transtibial amputation she is 2 weeks out from surgery.  Patient was progressing well and inpatient rehabilitation she was discharged to home yesterday and return to the emergency room yesterday evening with a reported temperature of 100.2 with laboratory studies showing an elevated white cell count.  Patient denies any pulmonary or urinary symptoms.  Past Medical History:  Diagnosis Date  . Anxiety   . Arthritis   . Bipolar disorder ([place])   . Broken jaw ([place])   . Broken wrist   . Chronic back pain   . Claudication ([place])    a. 12/2013 ABI's: R 0.97, L 0.94.  [name] COPD (chronic obstructive pulmonary disease) ([place])   . Depression   . Diabetic Charcot's foot ([place])   . Diabetic foot ulcer (HCC)    chronic left foot ulcer , great toe/  hx recurrent foot ulcer bilaterally  . DJD (degenerative joint disease)   . Edema of both lower extremities   . Episode of memory loss   . GERD (gastroesophageal reflux disease)   . Headache(784.0)   . Hiatal hernia   . History of [name] cancer, stage I dx 02-26-2015--- oncologist-- dr [name]--- per last note no recurrence   05-16-2015 s/p  Laparoscopic sigmoid colectomy w/ node bx's (negative nodes per path)  Stage I (pT1,N0,M0) Grade 2  . 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   per MRI multiple small strokes post cardiac cath. --  residual mild memory loss  . History of methicillin resistant staphylococcus aureus (MRSA)   . Hypercholesterolemia   . Insomnia   . Insulin dependent type 2 diabetes mellitus, uncontrolled ([employer]) dx 2004   endocrinologist-  dr Dwyane Dee-- last A1c 8.8 in Aug2018:  pt is noncompliant w/ diet, states does note eat breakfast , her first main meal in afternoon  . Neurogenic bladder   . Neuropathy,  diabetic ([place])    hands and feet  . OSA (obstructive sleep apnea)    cpap intolerant  . Personality disorder ([place])   . Restless leg syndrome   . SOB (shortness of breath) on exertion   . Stroke ([place])   . SUI (stress urinary incontinence, female) S/P SLING 12-29-2011   Past Surgical History:  Procedure Laterality Date  . ABDOMINAL HYSTERECTOMY    . AMPUTATION Right 04/13/2018   Procedure: RIGHT BELOW KNEE AMPUTATION;  Surgeon: Newt Minion, MD;  Location: [place];  Service: Orthopedics;  Laterality: Right;  . ANTERIOR CERVICAL DECOMP/DISCECTOMY FUSION  2000   C5 - 7  . CARDIAC CATHETERIZATION  05-22-2008   DR SKAINS   NO SIGNIFECANT CAD/ NORMAL LV/  EF 65%/  NO WALL MOTION ABNORMALITIES  . CARPAL TUNNEL RELEASE Right 04-25-2013  . [place]; 1992  . COLONOSCOPY    . CYSTO N/A 04/30/2013   Procedure: CYSTOSCOPY;  Surgeon: Reece Packer, MD;  Location: [facility];  Service: Urology;  Laterality: N/A;  . CYSTOSCOPY MACROPLASTIQUE IMPLANT N/A 02/06/2018   Procedure: CYSTOSCOPY MACROPLASTIQUE IMPLANT;  Surgeon: Bjorn Loser, MD;  Location: [facility];  Service: Urology;  Laterality: N/A;  . CYSTOSCOPY WITH INJECTION  05/04/2012   Procedure: CYSTOSCOPY WITH INJECTION;  Surgeon: Reece Packer, MD;  Location: [place];  Service: Urology;  Laterality: N/A;  MACROPLASTIQUE INJECTION  .  CYSTOSCOPY WITH INJECTION  08/28/2012   Procedure: CYSTOSCOPY WITH INJECTION;  Surgeon: Reece Packer, MD;  [facility];  Service: Urology;  Laterality: N/A;  cysto and macroplastique   . FOOT SURGERY Bilateral   . HERNIA REPAIR  ?1996   "stomach"  . INCISION AND DRAINAGE OF WOUND Right 04/11/2018   Procedure: IRRIGATION AND DEBRIDEMENT WOUND right foot and right ankle;  Surgeon: Evelina Bucy, DPM;  Location: [facility];  Service: Podiatry;  Laterality: Right;  . KNEE ARTHROSCOPY W/ ALLOGRAFT IMPANT Left    graft x 2  . KNEE SURGERY      TOTAL 8 SURG'S  . LAPAROSCOPIC SIGMOID COLECTOMY N/A 04/22/2015   Procedure: LAPAROSCOPIC HAND ASSISTED SIGMOID COLECTOMY;  Surgeon: Erroll Luna, MD;  Location: [facility];  Service: General;  Laterality: N/A;  . LEFT HEART CATHETERIZATION WITH CORONARY ANGIOGRAM N/A 02/12/2014   Procedure: LEFT HEART CATHETERIZATION WITH CORONARY ANGIOGRAM;  Surgeon: Candee Furbish, MD;  Location: [facility];  Service: Cardiovascular;  Laterality: N/A;   No angiographically significant CAD; normal LVSF, LVEDP 77mmHg,  EF 55% (new finding ef 30% myoview 01-08-2014)  . LUMBAR FUSION    . MANDIBLE FRACTURE SURGERY    . MULTIPLE LAPAROSCOPIES FOR ENDOMETRIOSIS    . PUBOVAGINAL SLING  12/29/2011   Procedure: Gaynelle Arabian;  Surgeon: Reece Packer, MD;  [facility];  Service: Urology;  Laterality: N/A;  cysto and sparc sling   . PUBOVAGINAL SLING N/A 04/30/2013   Procedure: REMOVAL OF VAGINAL MESH;  Surgeon: Reece Packer, MD;  Location[facility];  Service: Urology;  Laterality: N/A;  . RECONSTURCTION OF CONGENITAL UTERUS ANOMALY  1983  . REPEAT RECONSTRUCTION ACL LEFT KNEE/ SCREWS REMOVED  03-28-2000   CADAVER GRAFT  . TOTAL ABDOMINAL HYSTERECTOMY W/ BILATERAL SALPINGOOPHORECTOMY  1997  . TRANSTHORACIC ECHOCARDIOGRAM  02/13/2014   ef 45%, hypokinesis base inferior and base inferolateral walls  . UPPER GI ENDOSCOPY    . WOUND DEBRIDEMENT Right 09/05/2016   Procedure: DEBRIDEMENT WOUND WITH GRAFT RIGHT FOOT;  Surgeon: Landis Martins, DPM;  Location: [place];  Service: Podiatry;  Laterality: Right;  . WRIST FRACTURE SURGERY     Social History   Socioeconomic History  . Marital status: Married    Spouse name: Not on file  . Number of children: 2  . Years of education: 6th  . Highest education level: Not on file  Occupational History  . Not on file  Social Needs  . Financial resource strain: Not on file  . Food insecurity:    Worry: Not on file    Inability: Not on file  .  Transportation needs:    Medical: Not on file    Non-medical: Not on file  Tobacco Use  . Smoking status: Current Every Day Smoker    Packs/day: 4.00    Years: 45.00    Pack years: 180.00    Types: Cigarettes  . Smokeless tobacco: Never Used  . Tobacco comment: per pt started smoking  age 65  Substance and Sexual Activity  . Alcohol use: No    Alcohol/week: 0.0 oz  . Drug use: No  . Sexual activity: Yes  Lifestyle  . Physical activity:    Days per week: Not on file    Minutes per session: Not on file  . Stress: Not on file  Relationships  . Social connections:    Talks on phone: Not on file    Gets together: Not on  file    Attends religious service: Not on file    Active member of club or organization: Not on file    Attends meetings of clubs or organizations: Not on file    Relationship status: Not on file  Other Topics Concern  . Not on file  Social History Narrative   Patient is divorced and lives alone- independent in ADLs, Drives   Patient has two adult children- grown son and daughter who is special needs in a home   Patient is disabled since 41   Patient has a 6th grade education.   Patient is right-handed.   Depression-medication and therapist   Patient drinks 2- 3 liters of soda and drinks tea but not everyday.    Does not routinely exercise.      Patient reports that she drinks "a lot" of caffeine daily    Family History  Problem Relation Age of Onset  . Hypertension Mother   . Diabetes Mother   . Cancer - Other Mother        lymphoma   . Cancer - Other Father        lung, bladder cancer   . Heart attack Father   . Cancer - Other Brother        bladder cancer    - negative except otherwise stated in the family history section Allergies  Allergen Reactions  . Latex Itching, Rash and Other (See Comments)    Pt states she cannot use condoms - cause an infection.  Use of latex on skin is okay.  Tape causes rash  . Sweetening Enhancer [Flavoring  Agent] Nausea And Vomiting and Other (See Comments)    HEADACHES  . Aspartame And Phenylalanine Nausea And Vomiting    HEADACHES  . Ibuprofen Other (See Comments)    HEADACHES  . Trazodone And Nefazodone Other (See Comments)    Hallucinations   . Triazolam Other (See Comments)    HALLUCINATIONS   Prior to Admission medications   Medication Sig Start Date End Date Taking? Authorizing Provider  acetaminophen (TYLENOL) 325 MG tablet Take 1-2 tablets (325-650 mg total) by mouth every 4 (four) hours as needed for mild pain. 04/23/18  Yes Love, Ivan Anchors, PA-C  bisacodyl (DULCOLAX) 5 MG EC tablet Take 1 tablet (5 mg total) by mouth daily as needed for moderate constipation. 04/16/18  Yes Elgergawy, Silver Huguenin, MD  clonazePAM (KLONOPIN) 0.5 MG tablet Take 1 tablet (0.5 mg total) by mouth 3 (three) times daily as needed (anxiety). 04/26/18  Yes Love, Ivan Anchors, PA-C  docusate sodium (COLACE) 100 MG capsule Take 1 capsule (100 mg total) by mouth 2 (two) times daily. 04/16/18  Yes Elgergawy, Silver Huguenin, MD  gabapentin (NEURONTIN) 600 MG tablet TAKE 1 TABLET (600 MG TOTAL) BY MOUTH 2 (TWO) TIMES DAILY. 01/24/18  Yes Patel, Donika K, DO  glucose blood (FREESTYLE LITE) test strip Use to check blood sugars up to 4 times daily. Dx Code E11.65 03/16/18  Yes Elayne Snare, MD  insulin glargine (LANTUS) 100 UNIT/ML injection Inject 0.28 mLs (28 Units total) into the skin daily before breakfast. 04/26/18  Yes Love, Ivan Anchors, PA-C  lidocaine (LIDODERM) 5 % Place 1 patch onto the skin daily as needed (pain). Remove & Discard patch within 12 hours or as directed by MD 02/27/18  Yes Landis Martins, DPM  Melatonin 10 MG CAPS Take 10 mg by mouth at bedtime.    Yes [provider]  methocarbamol (ROBAXIN) 500 MG tablet Take  1 tablet (500 mg total) by mouth every 6 (six) hours as needed for muscle spasms. 04/26/18  Yes Love, Ivan Anchors, PA-C  nicotine (NICODERM CQ - DOSED IN MG/24 HOURS) 21 mg/24hr patch Place 1 patch (21 mg total)  onto the skin daily. 04/17/18  Yes Elgergawy, Silver Huguenin, MD  nortriptyline (PAMELOR) 25 MG capsule Take 1 capsule (25 mg total) by mouth at bedtime. 04/26/18  Yes Love, Ivan Anchors, PA-C  ondansetron (ZOFRAN) 4 MG tablet Take 1 tablet (4 mg total) by mouth every 6 (six) hours as needed for nausea. 04/16/18  Yes Elgergawy, Silver Huguenin, MD  oxyCODONE 10 MG TABS Take 1 tablet (10 mg total) by mouth every 6 (six) hours as needed for moderate pain (pain score 4-6). 04/26/18  Yes Love, Ivan Anchors, PA-C  pantoprazole (PROTONIX) 40 MG tablet Take 1 tablet (40 mg total) by mouth daily. 04/27/18  Yes Love, Ivan Anchors, PA-C  QUEtiapine (SEROQUEL) 300 MG tablet Take 2 tablets (600 mg total) by mouth at bedtime. 04/23/18  Yes Love, Ivan Anchors, PA-C  rOPINIRole (REQUIP) 2 MG tablet Take 1 tablet (2 mg total) by mouth daily. 04/26/18  Yes Love, Ivan Anchors, PA-C   Dg Chest 2 View  Result Date: 04/26/2018 CLINICAL DATA:  52 year old female with fever status post recent amputation EXAM: CHEST - 2 VIEW COMPARISON:  Prior chest x-ray 04/09/2018 FINDINGS: The lungs are clear and negative for focal airspace consolidation, pulmonary edema or suspicious pulmonary nodule. No pleural effusion or pneumothorax. Cardiac and mediastinal contours are within normal limits. No acute fracture or lytic or blastic osseous lesions. The visualized upper abdominal bowel gas pattern is unremarkable. Incompletely imaged anterior cervical fusion hardware. IMPRESSION: No active cardiopulmonary disease. Electronically Signed   By: Jacqulynn Cadet M.D.   On: 04/26/2018 21:14   Dg Tibia/fibula Right  Result Date: 04/26/2018 CLINICAL DATA:  52 year old female with fever. Recent right below-the-knee amputation EXAM: RIGHT TIBIA AND FIBULA - 2 VIEW COMPARISON:  None. FINDINGS: Surgical changes of below the knee amputation. Surgical staples are present. The osteotomy sites are well-marginated. No evidence of subcutaneous emphysema. No conventional radiographic evidence of  osteomyelitis. Mild soft tissue edema at the stump, not unexpected postoperative period. IMPRESSION: 1. Surgical changes of right below-the-knee amputation with expected postoperative edema. 2. No evidence of subcutaneous emphysema or unexpected bony changes. Electronically Signed   By: Jacqulynn Cadet M.D.   On: 04/26/2018 21:13     - pertinent xrays, CT, MRI studies were reviewed and independently interpreted  Positive ROS: All other systems have been reviewed and were otherwise negative with the exception of those mentioned in the HPI and as above.  Physical Exam: General: Alert, no acute distress Psychiatric: Patient is competent for consent with normal mood and affect Lymphatic: No axillary or cervical lymphadenopathy Cardiovascular: No pedal edema Respiratory: No cyanosis, no use of accessory musculature GI: No organomegaly, abdomen is soft and non-tender    Images:  @ENCIMAGES @  Labs:  Lab Results  Component Value Date   HGBA1C 9.2 03/19/2018   HGBA1C 10.0 12/11/2017   HGBA1C 8.8 08/08/2017   ESRSEDRATE 55 (H) 02/19/2016   CRP 17.4 (H) 02/19/2016   LABURIC 5.8 02/19/2016   REPTSTATUS 04/27/2018 FINAL 04/25/2018   GRAMSTAIN  04/11/2018    RARE WBC PRESENT, PREDOMINANTLY PMN NO ORGANISMS SEEN    CULT  04/25/2018    NO GROWTH Performed at [facility], [place] [address] [place], [place] [address]    LABORGA METHICILLIN RESISTANT  STAPHYLOCOCCUS AUREUS 04/11/2018     Neurologic: Patient does not have protective sensation bilateral lower extremities.   MUSCULOSKELETAL:   Skin: Examination patient has cellulitis of the posterior flap.  The wound edges are well approximated there is no necrosis no drainage.  There is no abscess.  Patient is alert oriented no adenopathy.  There is no ascending cellulitis.  White blood cell count 14.8, radiographs are unremarkable.  Assessment: Assessment: Diabetic insensate neuropathy with acute cellulitis right transtibial  amputation.  Plan: Plan: Would continue IV antibiotics for a short course at least 3 days and do not feel that patient needs surgical intervention.  Patient could be discharged on oral antibiotics.  Thank you for the consult and the opportunity to see Ms. Herma Ard, [place] [phone] 1:22 PM

## 2018-04-28 ENCOUNTER — Other Ambulatory Visit: Payer: Self-pay

## 2018-04-28 DIAGNOSIS — A419 Sepsis, unspecified organism: Secondary | ICD-10-CM

## 2018-04-28 DIAGNOSIS — I10 Essential (primary) hypertension: Secondary | ICD-10-CM

## 2018-04-28 DIAGNOSIS — G4733 Obstructive sleep apnea (adult) (pediatric): Secondary | ICD-10-CM

## 2018-04-28 DIAGNOSIS — F418 Other specified anxiety disorders: Secondary | ICD-10-CM

## 2018-04-28 DIAGNOSIS — E1142 Type 2 diabetes mellitus with diabetic polyneuropathy: Secondary | ICD-10-CM

## 2018-04-28 DIAGNOSIS — T8149XA Infection following a procedure, other surgical site, initial encounter: Secondary | ICD-10-CM

## 2018-04-28 LAB — BASIC METABOLIC PANEL
Anion gap: 12 (ref 5–15)
BUN: 6 mg/dL (ref 6–20)
CHLORIDE: 101 mmol/L (ref 101–111)
CO2: 27 mmol/L (ref 22–32)
CREATININE: 0.74 mg/dL (ref 0.44–1.00)
Calcium: 9.1 mg/dL (ref 8.9–10.3)
GFR calc non Af Amer: >60 mL/min (ref >60)
Glucose, Bld: 178 mg/dL — ABNORMAL HIGH (ref 65–99)
POTASSIUM: 3.9 mmol/L (ref 3.5–5.1)
Sodium: 140 mmol/L (ref 135–145)

## 2018-04-28 LAB — CBC
HEMATOCRIT: 33.9 % — AB (ref 36.0–46.0)
Hemoglobin: 11 g/dL — ABNORMAL LOW (ref 12.0–15.0)
MCH: 30.1 pg (ref 26.0–34.0)
MCHC: 32.4 g/dL (ref 30.0–36.0)
MCV: 92.6 fL (ref 78.0–100.0)
Platelets: 323 10*3/uL (ref 150–400)
RBC: 3.66 MIL/uL — AB (ref 3.87–5.11)
RDW: 13.7 % (ref 11.5–15.5)
WBC: 11.2 10*3/uL — AB (ref 4.0–10.5)

## 2018-04-28 LAB — GLUCOSE, CAPILLARY
GLUCOSE-CAPILLARY: 323 mg/dL — AB (ref 65–99)
GLUCOSE-CAPILLARY: 310 mg/dL — AB (ref 65–99)
Glucose-Capillary: 225 mg/dL — ABNORMAL HIGH (ref 65–99)
Glucose-Capillary: 262 mg/dL — ABNORMAL HIGH (ref 65–99)

## 2018-04-28 MED ORDER — INSULIN ASPART 100 UNIT/ML ~~LOC~~ SOLN
0.0000 [IU] | Freq: Every day | SUBCUTANEOUS | Status: DC
  Administered 2018-04-28: 3 [IU] via SUBCUTANEOUS
  Administered 2018-04-29: 2 [IU] via SUBCUTANEOUS

## 2018-04-28 MED ORDER — INSULIN ASPART 100 UNIT/ML ~~LOC~~ SOLN
0.0000 [IU] | Freq: Three times a day (TID) | SUBCUTANEOUS | Status: DC
  Administered 2018-04-28 – 2018-04-29 (×2): 11 [IU] via SUBCUTANEOUS
  Administered 2018-04-29: 3 [IU] via SUBCUTANEOUS
  Administered 2018-04-29: 5 [IU] via SUBCUTANEOUS
  Administered 2018-04-30: 3 [IU] via SUBCUTANEOUS

## 2018-04-28 MED ORDER — INSULIN GLARGINE 100 UNIT/ML ~~LOC~~ SOLN
5.0000 [IU] | Freq: Once | SUBCUTANEOUS | Status: AC
  Administered 2018-04-28: 5 [IU] via SUBCUTANEOUS
  Filled 2018-04-28: qty 0.05

## 2018-04-28 MED ORDER — OXYCODONE HCL 5 MG PO TABS
10.0000 mg | ORAL_TABLET | Freq: Four times a day (QID) | ORAL | Status: DC | PRN
  Administered 2018-04-28 – 2018-04-30 (×8): 10 mg via ORAL
  Filled 2018-04-28 (×8): qty 2

## 2018-04-28 MED ORDER — INSULIN GLARGINE 100 UNIT/ML ~~LOC~~ SOLN
33.0000 [IU] | Freq: Every day | SUBCUTANEOUS | Status: DC
  Administered 2018-04-29: 33 [IU] via SUBCUTANEOUS
  Filled 2018-04-28 (×3): qty 0.33

## 2018-04-28 NOTE — Progress Notes (Signed)
Progress Note    [name]  CWU:[id number] DOB: 07-Nov-1966  DOA: 04/26/2018 PCP: Tamsen Roers, MD    Brief Narrative:   Chief complaint: Follow-up stump infection.  Medical records reviewed and are as summarized below:  [name] is an 52 y.o. 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  Assessment/Plan:   Principal Problem:   Sepsis secondary to Postoperative cellulitis of surgical wound Met sepsis criteria with tachycardia, fever, elevated serum lactate and leukocytosis. Given fluid volume bolus in the ED with normalization of serum lactate levels. Continue empiric vancomycin/Zosyn. Plain films personally reviewed and did not show any evidence of osteomyelitis. WBC 24.3--->14.8>11.  Dr. Sharol Given, orthopedics consulted on 5/3 and recommended IV antibiotics for a short course at least 3 days and did not feel that patient needed surgical intervention.  He subsequently indicated discharge on oral antibiotics.  Urine culture from 5/1: Negative.  No blood cultures appear to have been sent.  Patient reports uncontrolled pain and not happy because she is on lower than home dose of her oxycodone IR, increased to prior home dose.  Active Problems:   Type II diabetes mellitus, uncontrolled (HCC) Increase Lantus to 33 units daily, change SSI to moderate sensitivity.  If not controlled may consider adding mealtime NovoLog.  CBGs currently ranging in the 225- 329 range.    Essential hypertension Not on antihypertensives routinely. Systolic blood pressure in the 150s, so may need to consider initiating therapy.  Continue to monitor for now.    Diabetic polyneuropathy (HCC) Continue Neurontin.   OSA (obstructive sleep apnea) Not on CPAP.    Hyponatremia Resolved.    Restless leg syndrome Continue Requip.    Depression with anxiety/bipolar Continue Seroquel, Pamelor, Klonopin.  Appears stable.    Obesity Body mass index is 33.7 kg/m.   Normocytic anemia Likely chronic disease and stable.   Family Communication/Anticipated D/C date and plan/Code Status   DVT prophylaxis: Lovenox ordered. Code Status: Full Code.  Family Communication: No family present at bedside. Disposition Plan: Likely need IV antibiotics through the weekend.   Medical Consultants:    Orthopedic Surgery   Anti-Infectives:    Vancomycin 04/26/18--->  Zosyn 04/26/18--->  Subjective:   Patient upset regarding many things.  States that the hospital is unclean and people have not clean her room, her sheets smell, food is cold, does not want to be in the hospital, does not get her medications on time, wants her pain medications increased to prior home dose.  Patient was interviewed and examined with her RN in room.  At the end of our conversation, patient appeared satisfied with measures that were taken.  Objective:    Vitals:   04/26/18 1920 04/26/18 2349 04/27/18 2215 04/28/18 1443  BP: (!) 156/70  (!) 150/65 (!) 156/79  Pulse: (!) 112  89 89  Resp: _0 Temp: 99.5 F (37.5 C)  98.5 F (36.9 C) 97.7 F (36.5 C)  TempSrc: Oral  Oral   SpO2: 99%  98% 96%  Weight:  91.9 kg (202 lb 8 oz)    Height:  _1  (1.651 m)      Intake/Output Summary (Last 24 hours) at 04/28/2018 1610 Last data filed at 04/28/2018  1351 Gross per 24 hour  Intake 770 ml  Output 1000 ml  Net -230 ml   Filed Weights   04/26/18 2349  Weight: 91.9 kg (202 lb 8 oz)    Exam: General: Pleasant young female, moderately built and obese, sitting up at edge of bed without any discomfort. Cardiovascular: S1 and S2 heard, RRR.  No JVD, murmurs or pedal edema.  Telemetry personally reviewed and shows sinus  rhythm. Lungs: Clear to auscultation.  No increased work of breathing. Abdomen: Nondistended, soft and nontender.  Normal bowel sounds heard. Neurological: Alert and oriented x3.  No focal neurological deficits. Skin: Warm and dry. No rashes or lesions.  Stable. Extremities: Right BKA stump dressing clean and dry.  Left lower extremity without acute findings. Psychiatric: Mood and affect remains irritable but not agitated. Insight and judgment are poor.   Data Reviewed:   I have personally reviewed following labs and imaging studies:  Labs: Labs show the following:   Basic Metabolic Panel: Recent Labs  Lab 04/23/18 0513 04/26/18 1936 04/27/18 0426 04/28/18 0446  NA 134* 134* 139 140  K 4.2 3.9 3.7 3.9  CL 101 94* 101 101  CO2 _0 GLUCOSE 238* 209* 150* 178*  BUN _1 CREATININE 0.70 0.73 0.61 0.74  CALCIUM 8.9 9.1 9.0 9.1   GFR Estimated Creatinine Clearance: 92.2 mL/min (by C-G formula based on SCr of 0.74 mg/dL). Liver Function Tests: Recent Labs  Lab 04/26/18 1936  AST 17  ALT 16  ALKPHOS 97  BILITOT 0.4  PROT 7.0  ALBUMIN 2.9*    CBC: Recent Labs  Lab 04/24/18 0512 04/26/18 0653 04/26/18 1936 04/27/18 0426 04/28/18 0446  WBC 11.2* 9.1 24.3* 14.8* 11.2*  NEUTROABS  --   --  20.3* 10.3*  --   HGB 10.9* 11.0* 11.7* 11.0* 11.0*  HCT 33.9* 34.5* 35.6* 33.9* 33.9*  MCV 93.1 93.5 93.2 92.6 92.6  PLT 325 333 350 339 323   CBG: Recent Labs  Lab 04/27/18 1218 04/27/18 1708 04/27/18 2154 04/28/18 0802 04/28/18 1159  GLUCAP 240* 329* 303* 225* 323*   Sepsis Labs: Recent Labs  Lab 04/26/18 0653 04/26/18 1936 04/26/18 2015 04/26/18 2317 04/27/18 0426 04/28/18 0446  WBC 9.1 24.3*  --   --  14.8* 11.2*  LATICACIDVEN  --   --  4.62* 3.77* 1.9  --     Microbiology Recent Results (from the past 240 hour(s))  Culture, Urine     Status: Abnormal   Collection Time: 04/21/18  1:52 PM  Result Value Ref Range Status   Specimen  Description URINE, CLEAN CATCH  Final   Special Requests NONE  Final   Culture (A)  Final    <10,000 COLONIES/mL INSIGNIFICANT GROWTH Performed at [facility], [address] [place], [place] [address]    Report Status 04/22/2018 FINAL  Final  Culture, Urine     Status: None   Collection Time: 04/25/18  9:05 PM  Result Value Ref Range Status   Specimen Description URINE, RANDOM  Final   Special Requests NONE  Final   Culture   Final    NO GROWTH Performed at [facility], [place] [address], [place], [place] [address]    Report Status 04/27/2018 FINAL  Final    Procedures and diagnostic studies:  Dg Chest 2 View  Result Date: 04/26/2018 CLINICAL DATA:  52 year old female with fever status post recent amputation EXAM: CHEST - 2 VIEW  COMPARISON:  Prior chest x-ray 04/09/2018 FINDINGS: The lungs are clear and negative for focal airspace consolidation, pulmonary edema or suspicious pulmonary nodule. No pleural effusion or pneumothorax. Cardiac and mediastinal contours are within normal limits. No acute fracture or lytic or blastic osseous lesions. The visualized upper abdominal bowel gas pattern is unremarkable. Incompletely imaged anterior cervical fusion hardware. IMPRESSION: No active cardiopulmonary disease. Electronically Signed   By: Jacqulynn Cadet M.D.   On: 04/26/2018 21:14   Dg Tibia/fibula Right  Result Date: 04/26/2018 CLINICAL DATA:  52 year old female with fever. Recent right below-the-knee amputation EXAM: RIGHT TIBIA AND FIBULA - 2 VIEW COMPARISON:  None. FINDINGS: Surgical changes of below the knee amputation. Surgical staples are present. The osteotomy sites are well-marginated. No evidence of subcutaneous emphysema. No conventional radiographic evidence of osteomyelitis. Mild soft tissue edema at the stump, not unexpected postoperative period. IMPRESSION: 1. Surgical changes of right below-the-knee amputation with expected postoperative edema. 2. No evidence of  subcutaneous emphysema or unexpected bony changes. Electronically Signed   By: Jacqulynn Cadet M.D.   On: 04/26/2018 21:13    Medications:   . docusate sodium  100 mg Oral BID  . enoxaparin (LOVENOX) injection  40 mg Subcutaneous Q24H  . gabapentin  600 mg Oral BID  . insulin aspart  0-5 Units Subcutaneous QHS  . insulin aspart  0-9 Units Subcutaneous TID WC  . insulin glargine  28 Units Subcutaneous QAC breakfast  . nortriptyline  25 mg Oral QHS  . pantoprazole  40 mg Oral Daily  . QUEtiapine  600 mg Oral QHS  . rOPINIRole  2 mg Oral Daily   Continuous Infusions: . piperacillin-tazobactam (ZOSYN)  IV 3.375 g (04/28/18 1351)  . vancomycin Stopped (04/28/18 0701)     LOS: 2 days   Vernell Leep, MD, FACP, [facility]. [facility] Pager [phone]  If 7PM-7AM, please contact night-coverage [email or web address] Password [email or web address] 04/28/2018, 4:18 PM

## 2018-04-29 DIAGNOSIS — E1165 Type 2 diabetes mellitus with hyperglycemia: Secondary | ICD-10-CM

## 2018-04-29 DIAGNOSIS — Z794 Long term (current) use of insulin: Secondary | ICD-10-CM

## 2018-04-29 LAB — GLUCOSE, CAPILLARY
GLUCOSE-CAPILLARY: 229 mg/dL — AB (ref 65–99)
Glucose-Capillary: 169 mg/dL — ABNORMAL HIGH (ref 65–99)
Glucose-Capillary: 326 mg/dL — ABNORMAL HIGH (ref 65–99)
Glucose-Capillary: 227 mg/dL — ABNORMAL HIGH (ref 65–99)

## 2018-04-29 MED ORDER — INSULIN ASPART 100 UNIT/ML ~~LOC~~ SOLN
4.0000 [IU] | Freq: Three times a day (TID) | SUBCUTANEOUS | Status: DC
  Administered 2018-04-29 – 2018-04-30 (×2): 4 [IU] via SUBCUTANEOUS

## 2018-04-29 NOTE — Progress Notes (Signed)
Progress Note    [name]  MPN:[id number] DOB: 1966-11-09  DOA: 04/26/2018 PCP: Tamsen Roers, MD    Brief Narrative:   Chief complaint: Follow-up stump infection.  Medical records reviewed and are as summarized below:  [name] is an 52 y.o. 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  Improving.  Likely discharge home 5/6.  Assessment/Plan:   Principal Problem:   Sepsis secondary to Postoperative cellulitis of surgical wound Met sepsis criteria with tachycardia, fever, elevated serum lactate and leukocytosis. Given fluid volume bolus in the ED with normalization of serum lactate levels. Continue empiric vancomycin/Zosyn. Plain films personally reviewed and did not show any evidence of osteomyelitis. WBC 24.3--->14.8>11.  Dr. Sharol Given, orthopedics consulted on 5/3 and recommended IV antibiotics for a short course at least 3 days and did not feel that patient needed surgical intervention.  He subsequently indicated discharge on oral antibiotics.  Urine culture from 5/1: Negative.  No blood cultures appear to have been sent.  Pain is better controlled.  Removed stump shrinker and evaluated postop wound which is healing quite well.  Improving.  Continue additional 24 hours of IV antibiotics then plan to discharge home on 5/6 with oral doxycycline and outpatient follow-up with Dr. Sharol Given.    Active Problems:   Type II diabetes mellitus, uncontrolled (HCC) Increase Lantus to 33 units daily, change SSI to moderate sensitivity.  Uncontrolled.  Patient not compliant with diet and refuses to change to diabetic diet.  CBGs fluctuating.  Advised patient that unless DM is better controlled, it will  delay wound healing and may precipitate infection but she insists on regular diet.  Add mealtime NovoLog 4 units.  Monitor closely.  A1c 03/19/18: 9.2.    Essential hypertension Not on antihypertensives routinely. Systolic blood pressure in the 150s, so may need to consider initiating therapy.  Continue to monitor for now.  Defer to outpatient follow-up.    Diabetic polyneuropathy (HCC) Continue Neurontin.    OSA (obstructive sleep apnea) Not on CPAP.    Hyponatremia Resolved.    Restless leg syndrome Continue Requip.    Depression with anxiety/bipolar Continue Seroquel, Pamelor, Klonopin.  Appears stable.    Obesity Body mass index is 33.36 kg/m.   Normocytic anemia Likely chronic disease and stable.   Family Communication/Anticipated D/C date and plan/Code Status   DVT prophylaxis: Lovenox ordered. Code Status: Full Code.  Family Communication: No family present at bedside. Disposition Plan: Likely need IV antibiotics through the weekend.   Medical Consultants:    Orthopedic Surgery   Anti-Infectives:    Vancomycin 04/26/18--->  Zosyn 04/26/18--->  Subjective:   Patient reports that pain, redness of right lower extremity surgical stump have significantly improved compared to admission.  Patient was interviewed and examined along with her RN in room.  No other complaints reported.  Objective:    Vitals:   04/27/18 2215 04/28/18 1443 04/28/18 2216 04/29/18 0505  BP: (!) 150/65 (!) 156/79 128/80 (!) 145/74  Pulse: 89 89 96 85  Resp: _0 Temp: 98.5 F (36.9 C) 97.7 F (36.5 C) 98.9 F (37.2 C) 97.6 F (36.4 C)  TempSrc: Oral  Oral Oral  SpO2: 98% 96% 98% 98%  Weight:  90.9 kg (200 lb 8 oz)  Height:        Intake/Output Summary (Last 24 hours) at 04/29/2018 1351 Last data filed at 04/29/2018 1059 Gross per 24 hour  Intake 1040 ml  Output 2500 ml  Net -1460 ml   Filed Weights   04/26/18 2349 04/29/18 0505  Weight: 91.9 kg (202 lb 8 oz)  90.9 kg (200 lb 8 oz)    Exam: General: Pleasant young female, moderately built and obese, lying comfortably supine in bed.  Cardiovascular: S1 and S2 heard, RRR.  No JVD, murmurs or pedal edema.  Telemetry personally reviewed and showed sinus rhythm. Lungs: Clear to auscultation.  No increased work of breathing.  Stable. Abdomen: Nondistended, soft and nontender.  Normal bowel sounds heard.  Stable. Neurological: Alert and oriented x3.  No focal neurological deficits.  Stable. Skin: Warm and dry. No rashes or lesions.  Stable. Extremities: Right BKA surgical staples intact, minimal patchy redness at the suture line but improving compared to prior, no drainage or fluctuance.  Rest of stump appears without acute findings. Psychiatric: Mood and affect remains irritable but not agitated. Insight and judgment are poor.   Data Reviewed:   I have personally reviewed following labs and imaging studies:  Labs: Labs show the following:   Basic Metabolic Panel: Recent Labs  Lab 04/23/18 0513 04/26/18 1936 04/27/18 0426 04/28/18 0446  NA 134* 134* 139 140  K 4.2 3.9 3.7 3.9  CL 101 94* 101 101  CO2 _0 GLUCOSE 238* 209* 150* 178*  BUN _1 CREATININE 0.70 0.73 0.61 0.74  CALCIUM 8.9 9.1 9.0 9.1   GFR Estimated Creatinine Clearance: 91.7 mL/min (by C-G formula based on SCr of 0.74 mg/dL). Liver Function Tests: Recent Labs  Lab 04/26/18 1936  AST 17  ALT 16  ALKPHOS 97  BILITOT 0.4  PROT 7.0  ALBUMIN 2.9*    CBC: Recent Labs  Lab 04/24/18 0512 04/26/18 0653 04/26/18 1936 04/27/18 0426 04/28/18 0446  WBC 11.2* 9.1 24.3* 14.8* 11.2*  NEUTROABS  --   --  20.3* 10.3*  --   HGB 10.9* 11.0* 11.7* 11.0* 11.0*  HCT 33.9* 34.5* 35.6* 33.9* 33.9*  MCV 93.1 93.5 93.2 92.6 92.6  PLT 325 333 350 339 323   CBG: Recent Labs  Lab 04/28/18 1159 04/28/18 1718 04/28/18 2217 04/29/18 0758 04/29/18 1204  GLUCAP 323* 310* 262* 169* 326*   Sepsis Labs: Recent  Labs  Lab 04/26/18 0653 04/26/18 1936 04/26/18 2015 04/26/18 2317 04/27/18 0426 04/28/18 0446  WBC 9.1 24.3*  --   --  14.8* 11.2*  LATICACIDVEN  --   --  4.62* 3.77* 1.9  --     Microbiology Recent Results (from the past 240 hour(s))  Culture, Urine     Status: Abnormal   Collection Time: 04/21/18  1:52 PM  Result Value Ref Range Status   Specimen Description URINE, CLEAN CATCH  Final   Special Requests NONE  Final   Culture (A)  Final    <10,000 COLONIES/mL INSIGNIFICANT GROWTH Performed at [facility], [address] [place], [place] [address]    Report Status 04/22/2018 FINAL  Final  Culture, Urine     Status: None   Collection Time: 04/25/18  9:05 PM  Result Value Ref Range Status   Specimen Description URINE, RANDOM  Final   Special Requests NONE  Final   Culture   Final    NO GROWTH  Performed at [facility], [place] [address] [place], [place] [address]    Report Status 04/27/2018 FINAL  Final    Procedures and diagnostic studies:  No results found.  Medications:   . docusate sodium  100 mg Oral BID  . enoxaparin (LOVENOX) injection  40 mg Subcutaneous Q24H  . gabapentin  600 mg Oral BID  . insulin aspart  0-15 Units Subcutaneous TID WC  . insulin aspart  0-5 Units Subcutaneous QHS  . insulin glargine  33 Units Subcutaneous QAC breakfast  . nortriptyline  25 mg Oral QHS  . pantoprazole  40 mg Oral Daily  . QUEtiapine  600 mg Oral QHS  . rOPINIRole  2 mg Oral Daily   Continuous Infusions: . piperacillin-tazobactam (ZOSYN)  IV Stopped (04/29/18 0900)  . vancomycin Stopped (04/29/18 1601)     LOS: 3 days   Vernell Leep, MD, FACP, [facility]. [facility] Pager [phone]  If 7PM-7AM, please contact night-coverage [email or web address] Password [email or web address] 04/29/2018, 1:51 PM

## 2018-04-30 ENCOUNTER — Telehealth: Payer: Self-pay | Admitting: Registered Nurse

## 2018-04-30 LAB — GLUCOSE, CAPILLARY: Glucose-Capillary: 165 mg/dL — ABNORMAL HIGH (ref 65–99)

## 2018-04-30 MED ORDER — DOXYCYCLINE HYCLATE 100 MG PO TABS: 100.0000 mg | ORAL_TABLET | Freq: Two times a day (BID) | ORAL | 0 refills | Status: DC

## 2018-04-30 NOTE — Progress Notes (Signed)
Patient ID: [name], female   DOB: [birth year], 52 y.o.   MRN: [id number] The cellulitis of her below the knee amputation has completely resolved.  There is a little bit of redness around the staples.  I feel it safe for patient for discharge to home discussed proper washing of the stump shrinker wearing the shrinker around-the-clock continue oral antibiotics for 10 to 14 days I will follow-up in the office in 1 week.

## 2018-04-30 NOTE — Telephone Encounter (Signed)
Noted  

## 2018-04-30 NOTE — Telephone Encounter (Signed)
This Provider placed a call to [facility] regarding [facility]. [facility] discharge. The receptionist states she will inform the nurses.

## 2018-04-30 NOTE — Discharge Summary (Signed)
Physician Discharge Summary  [name] VHQ:[id number] DOB: 1966/08/29  PCP: Tamsen Roers, MD  Admit date: 04/26/2018 Discharge date: 04/30/2018  Recommendations for Outpatient Follow-up:  1. Dr. Tamsen Roers, PCP in 4 days with repeat labs (CBC & BMP). 2. Dr. Meridee Score, Orthopedics on 05/03/2018 at 9:45 AM. 3. Patient has prior appointment with Dr. Elayne Snare, Endocrinology on 05/17/2018 at 3 PM.  Home Health: None Equipment/Devices: None  Discharge Condition: Improved and stable CODE STATUS: Full. Diet recommendation: Heart healthy & diabetic diet.  Discharge Diagnoses:  Principal Problem:   Sepsis ([place]) Active Problems:   Type II diabetes mellitus, uncontrolled ([place])   Essential hypertension   Diabetic polyneuropathy (H[facility])   Hyperlipemia   OSA (obstructive sleep apnea)   Hyponatremia   Postoperative cellulitis of surgical wound   Restless leg syndrome   Depression with anxiety   Brief Summary: [name] is an 52 y.o. 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  She was admitted for sepsis secondary to postop cellulitis of surgical wound.   Assessment and plan:  Principal Problem:   Sepsis secondary to Postoperative cellulitis of surgical wound Met sepsis criteria with tachycardia, fever, elevated serum lactate and leukocytosis. Given fluid volume bolus in the ED with normalization of serum lactate levels.  Treated with empiric vancomycin/Zosyn. Plain films personally reviewed and did not show any evidence of osteomyelitis. WBC 24.3--->14.8>11.  Dr. Sharol Given, orthopedics consulted on 5/3 and recommended IV antibiotics for a short course at least 3 days and did not feel that  patient needed surgical intervention.  He subsequently indicated discharge on oral antibiotics.  Urine culture from 5/1: Negative.  No blood cultures appear to have been sent.   Dr. Sharol Given evaluated patient on day of discharge.  Cellulitis had resolved.  I discussed with him and he recommended total 10-14 days of antibiotics and doxycycline at discharge.  Continue stump shrinker.    Active Problems:   Type II diabetes mellitus, uncontrolled ([place]) In the hospital patient was treated with Lantus and SSI mealtime plus sliding scale.  A1c 03/19/2018: 9.2 suggests poor outpatient control.  Patient appears noncompliant and refused to be on a diabetic diet while hospitalized.  Advised patient that unless DM is better controlled, it will delay wound healing and may precipitate infection but she insists on regular diet.    At discharge, patient was resumed on home medication regimen (Basaglar, NovoLog mealtime and Jardiance) that she was on prior to recent hospitalization.  She has an upcoming appointment with endocrinology and was advised to keep it.  Discussed in detail with patient & spouse regarding importance of compliance with all aspects of medical care and they verbalized understanding.    Essential hypertension Mildly uncontrolled at times.  Not on antihypertensives PTA.  May consider starting ACEI/ARB during outpatient follow-up.    Diabetic polyneuropathy (HCC) Continue Neurontin.    OSA (obstructive sleep apnea) Not on CPAP.    Hyponatremia Resolved.    Restless leg syndrome Continue Requip.    Depression with anxiety/bipolar Continue Seroquel, Pamelor, Klonopin.  Appears stable.    Obesity Body mass index is 33.36 kg/m.   Normocytic anemia Likely chronic disease and stable.      Consultations:  Orthopedics  Procedures:  None  Discharge Instructions  Discharge Instructions    Call MD for:  difficulty breathing, headache or visual disturbances   Complete by:   As directed    Call MD for:  extreme fatigue   Complete by:  As directed    Call MD for:  persistant dizziness or light-headedness   Complete by:  As directed    Call MD for:  persistant nausea and vomiting   Complete by:  As directed    Call MD for:  redness, tenderness, or signs of infection (pain, swelling, redness, odor or green/yellow discharge around incision site)   Complete by:  As directed    Call MD for:  severe uncontrolled pain   Complete by:  As directed    Call MD for:  temperature >100.4   Complete by:  As directed    Diet - low sodium heart healthy   Complete by:  As directed    Diet Carb Modified   Complete by:  As directed    Discharge instructions   Complete by:  As directed    1) Acetaminophen/Tylenol from all sources not to exceed 3 gm per day. 2) Non weight bearing on Right lower extremity. 3) proper washing of the stump shrinker wearing the shrinker around-the-clock   Increase activity slowly   Complete by:  As directed        Medication List    TAKE these medications   acetaminophen 325 MG tablet Commonly known as:  TYLENOL Take 1-2 tablets (325-650 mg total) by mouth every 4 (four) hours as needed for mild pain.   BASAGLAR KWIKPEN 100 UNIT/ML Sopn Inject 36 Units into the skin at bedtime. What changed:  Another medication with the same name was removed. Continue taking this medication, and follow the directions you see here.   bisacodyl 5 MG EC tablet Commonly known as:  DULCOLAX Take 1 tablet (5 mg total) by mouth daily as needed for moderate constipation.   clonazePAM 0.5 MG tablet Commonly known as:  KLONOPIN Take 1 tablet (0.5 mg total) by mouth 3 (three) times daily as needed (anxiety).   docusate sodium 100 MG capsule Commonly known as:  COLACE Take 1 capsule (100 mg total) by mouth 2 (two) times daily.   doxycycline 100 MG tablet Commonly known as:  VIBRA-TABS Take 1 tablet (100 mg total) by mouth 2 (two) times daily.     gabapentin 600 MG tablet Commonly known as:  NEURONTIN TAKE 1 TABLET (600 MG TOTAL) BY MOUTH 2 (TWO) TIMES DAILY.   glucose blood test strip Commonly known as:  FREESTYLE LITE Use to check blood sugars up to 4 times daily. Dx Code E11.65   insulin aspart 100 UNIT/ML injection Commonly known as:  novoLOG Inject 10-35 Units into the skin 3 (three) times daily with meals. Take 10 units before your main meal. If blood sugars are consistently over 250 after the meal start taking 14 units before your main meal. 35 UNITS AT SUPPER   JARDIANCE 25 MG Tabs tablet Generic drug:  empagliflozin Take 25 mg by mouth daily.   lidocaine 5 % Commonly known as:  LIDODERM Place 1 patch onto the skin daily as needed (pain). Remove & Discard patch within 12 hours or as directed by MD   Melatonin 10 MG Caps Take 10 mg by mouth at bedtime.   methocarbamol 500 MG tablet Commonly known as:  ROBAXIN Take 1 tablet (500 mg total) by mouth every 6 (six) hours as needed for muscle spasms.  nicotine 21 mg/24hr patch Commonly known as:  NICODERM CQ - dosed in mg/24 hours Place 1 patch (21 mg total) onto the skin daily.   nortriptyline 25 MG capsule Commonly known as:  PAMELOR Take 1 capsule (25 mg total) by mouth at bedtime.   ondansetron 4 MG tablet Commonly known as:  ZOFRAN Take 1 tablet (4 mg total) by mouth every 6 (six) hours as needed for nausea.   Oxycodone HCl 10 MG Tabs Take 1 tablet (10 mg total) by mouth every 6 (six) hours as needed for moderate pain (pain score 4-6).   pantoprazole 40 MG tablet Commonly known as:  PROTONIX Take 1 tablet (40 mg total) by mouth daily.   QUEtiapine 300 MG tablet Commonly known as:  SEROQUEL Take 2 tablets (600 mg total) by mouth at bedtime.   rOPINIRole 2 MG tablet Commonly known as:  REQUIP Take 1 tablet (2 mg total) by mouth daily.      Follow-up Information    Newt Minion, MD. Schedule an appointment as soon as possible for a visit in 1  week(s).   Specialty:  Orthopedic Surgery Contact information: [place] [address] [phone]        Tamsen Roers, MD. Schedule an appointment as soon as possible for a visit in 4 day(s).   Specialty:  Family Medicine Why:  To be seen with repeat labs (CBC & BMP). Contact information: [address] [place] [address] [phone]        Elayne Snare, MD Follow up on 05/17/2018.   Specialty:  Endocrinology Why:  3 pm. Diabetes follow up. Contact information: [address] [place] [address] [phone]          Allergies  Allergen Reactions  . Latex Itching, Rash and Other (See Comments)    Pt states she cannot use condoms - cause an infection.  Use of latex on skin is okay.  Tape causes rash  . Sweetening Enhancer [Flavoring Agent] Nausea And Vomiting and Other (See Comments)    HEADACHES  . Aspartame And Phenylalanine Nausea And Vomiting    HEADACHES  . Ibuprofen Other (See Comments)    HEADACHES  . Trazodone And Nefazodone Other (See Comments)    Hallucinations   . Triazolam Other (See Comments)    HALLUCINATIONS      Procedures/Studies: Dg Chest 2 View  Result Date: 04/26/2018 CLINICAL DATA:  52 year old female with fever status post recent amputation EXAM: CHEST - 2 VIEW COMPARISON:  Prior chest x-ray 04/09/2018 FINDINGS: The lungs are clear and negative for focal airspace consolidation, pulmonary edema or suspicious pulmonary nodule. No pleural effusion or pneumothorax. Cardiac and mediastinal contours are within normal limits. No acute fracture or lytic or blastic osseous lesions. The visualized upper abdominal bowel gas pattern is unremarkable. Incompletely imaged anterior cervical fusion hardware. IMPRESSION: No active cardiopulmonary disease. Electronically Signed   By: Jacqulynn Cadet M.D.   On: 04/26/2018 21:14   Dg Tibia/fibula Right  Result Date: 04/26/2018 CLINICAL DATA:  52 year old female with  fever. Recent right below-the-knee amputation EXAM: RIGHT TIBIA AND FIBULA - 2 VIEW COMPARISON:  None. FINDINGS: Surgical changes of below the knee amputation. Surgical staples are present. The osteotomy sites are well-marginated. No evidence of subcutaneous emphysema. No conventional radiographic evidence of osteomyelitis. Mild soft tissue edema at the stump, not unexpected postoperative period. IMPRESSION: 1. Surgical changes of right below-the-knee amputation with expected postoperative edema. 2. No evidence of subcutaneous  emphysema or unexpected bony changes. Electronically Signed   By: Jacqulynn Cadet M.D.   On: 04/26/2018 21:13      Subjective: Patient denies complaints.  No pain.  States that the redness, swelling from right lower extremity has resolved.  Anxious for early discharge because she has to keep her psychologist appointment late this morning.  Discharge Exam:  Vitals:   04/29/18 1400 04/29/18 1757 04/29/18 2109 04/30/18 0459  BP: (!) 160/74 (!) 156/76 (!) 164/79 131/73  Pulse: 97 91 90 87  Resp: _0 Temp: 98.5 F (36.9 C) 98.1 F (36.7 C) 98.5 F (36.9 C) 97.8 F (36.6 C)  TempSrc: Oral Oral Oral   SpO2: 96% 98% 99% 100%  Weight:      Height:        General: Pleasant young female, moderately built and obese, lying comfortably supine in bed.   Cardiovascular: S1 and S2 heard, RRR.  No JVD, murmurs or pedal edema.  Lungs: Clear to auscultation.  No increased work of breathing.   Abdomen: Nondistended, soft and nontender.  Normal bowel sounds heard. Neurological: Alert and oriented x3.  No focal neurological deficits.   Skin: Warm and dry. No rashes or lesions.   Extremities: Right BKA surgical staples intact and no acute findings. Cellulitis features have resolved. Psychiatric: Mood and affect flat but appropriate. Insight and judgment are poor.      The results of significant diagnostics from this hospitalization (including imaging, microbiology,  ancillary and laboratory) are listed below for reference.     Microbiology: Recent Results (from the past 240 hour(s))  Culture, Urine     Status: Abnormal   Collection Time: 04/21/18  1:52 PM  Result Value Ref Range Status   Specimen Description URINE, CLEAN CATCH  Final   Special Requests NONE  Final   Culture (A)  Final    <10,000 COLONIES/mL INSIGNIFICANT GROWTH Performed at [facility], [address], [place], [place] [address]    Report Status 04/22/2018 FINAL  Final  Culture, Urine     Status: None   Collection Time: 04/25/18  9:05 PM  Result Value Ref Range Status   Specimen Description URINE, RANDOM  Final   Special Requests NONE  Final   Culture   Final    NO GROWTH Performed at [facility], [place] [address] [place], [place] [address]    Report Status 04/27/2018 FINAL  Final     Labs: CBC: Recent Labs  Lab 04/24/18 0512 04/26/18 0653 04/26/18 1936 04/27/18 0426 04/28/18 0446  WBC 11.2* 9.1 24.3* 14.8* 11.2*  NEUTROABS  --   --  20.3* 10.3*  --   HGB 10.9* 11.0* 11.7* 11.0* 11.0*  HCT 33.9* 34.5* 35.6* 33.9* 33.9*  MCV 93.1 93.5 93.2 92.6 92.6  PLT 325 333 350 339 970   Basic Metabolic Panel: Recent Labs  Lab 04/26/18 1936 04/27/18 0426 04/28/18 0446  NA 134* 139 140  K 3.9 3.7 3.9  CL 94* 101 101  CO2 _1 GLUCOSE 209* 150* 178*  BUN _2 CREATININE 0.73 0.61 0.74  CALCIUM 9.1 9.0 9.1   Liver Function Tests: Recent Labs  Lab 04/26/18 1936  AST 17  ALT 16  ALKPHOS 97  BILITOT 0.4  PROT 7.0  ALBUMIN 2.9*   CBG: Recent Labs  Lab 04/29/18 0758 04/29/18 1204 04/29/18 1724 04/29/18 2231 04/30/18 0839  GLUCAP 169* 326* 227* 229* 165*   Urinalysis  Component Value Date/Time   COLORURINE STRAW (A) 04/25/2018 2217   APPEARANCEUR CLEAR 04/25/2018 2217   LABSPEC 1.004 (L) 04/25/2018 2217   PHURINE 6.0 04/25/2018 2217   GLUCOSEU NEGATIVE 04/25/2018 2217   GLUCOSEU >=1000 (A) 01/07/2015 1410   HGBUR NEGATIVE  04/25/2018 2217   [place] NEGATIVE 04/25/2018 2217   [place] NEGATIVE 04/25/2018 2217   PROTEINUR NEGATIVE 04/25/2018 2217   UROBILINOGEN 0.2 04/23/2015 2355   NITRITE NEGATIVE 04/25/2018 2217   LEUKOCYTESUR SMALL (A) 04/25/2018 2217   Discussed in detail with patient's spouse at bedside.  Updated care and answered questions.   Time coordinating discharge: 40 minutes  SIGNED:  Vernell Leep, MD, FACP, [facility]. [facility] Pager [phone]  If 7PM-7AM, please contact night-coverage [email or web address] Password [email or web address] 04/30/2018, 9:26 AM

## 2018-04-30 NOTE — Progress Notes (Signed)
[name] to be D/C'd Home per MD order.  Discussed with the patient and all questions fully answered.  VSS, Skin clean, dry and intact without evidence of skin break down, no evidence of skin tears noted. IV catheter discontinued intact. Site without signs and symptoms of complications. Dressing and pressure applied.  An After Visit Summary was printed and given to the patient. Patient received prescription.  D/c education completed with patient/family including follow up instructions, medication list, d/c activities limitations if indicated, with other d/c instructions as indicated by MD - patient able to verbalize understanding, all questions fully answered.   Patient instructed to return to ED, call 911, or call MD for any changes in condition.   Patient escorted via [place], and D/C home via private auto.  [place] 04/30/2018 10:25 AM

## 2018-04-30 NOTE — Telephone Encounter (Signed)
Transitional Care call Transitional Care Call Completed, Appointment Confirmed, Address Confirmed, New Patient Packet Mailed  Patient name: [name] DOB: [birth year] 1. Are you/is patient experiencing any problems since coming home? Yes, she was re-admiited on 04/26/2018 for Sepsis. H&P Reviewed, she was discharged on 04/30/2018. No discharge summary at this time.  a. Are there any questions regarding any aspect of care? No 2. Are there any questions regarding medications administration/dosing? No a. Are meds being taken as prescribed? Yes b. "Patient should review meds with caller to confirm" Medication List reviewed 3. Have there been any falls? No 4. Has Home Health been to the house and/or have they contacted you? No,, due to re-admission. Will place a call to [facility], she verbalizes understanding.  a. If not, have you tried to contact them? NA b. Can we help you contact them? Yes, call will be placed today.  5. Are bowels and bladder emptying properly? Yes a. Are there any unexpected incontinence issues? No b. If applicable, is patient following bowel/bladder programs? NA 6. Any fevers, problems with breathing, unexpected pain? No 7. Are there any skin problems or new areas of breakdown? No 8. Has the patient/family member arranged specialty MD follow up (ie cardiology/neurology/renal/surgical/etc.)?  Follow up appointments reviewed, she verbalizes understanding. All appointments were scheduled, she verbalizes understanding.  a. Can we help arrange? NA 9. Does the patient need any other services or support that we can help arrange? NA 10. Are caregivers following through as expected in assisting the patient? Yes 11. Has the patient quit smoking, drinking alcohol, or using drugs as recommended? [name] states she smokes and after she finishes her 2 packs of cigarettes she will quit. Educated on smoking cessation she verbalizes understanding. Also denies drinking alcohol or use of  illicit drugs.    Appointment date/time 05/08/2018 arrival 9:20 for 9;40 appointment with Dr. Naaman Plummer. At [place]

## 2018-04-30 NOTE — Discharge Instructions (Signed)
Please get your medications reviewed and adjusted by your Primary MD. ° °Please request your Primary MD to go over all Hospital Tests and Procedure/Radiological results at the follow up, please get all Hospital records sent to your Prim MD by signing hospital release before you go home. ° °If you had Pneumonia of Lung problems at the Hospital: °Please get a 2 view Chest X ray done in 6-8 weeks after hospital discharge or sooner if instructed by your Primary MD. ° °If you have Congestive Heart Failure: °Please call your Cardiologist or Primary MD anytime you have any of the following symptoms:  °1) 3 pound weight gain in 24 hours or 5 pounds in 1 week  °2) shortness of breath, with or without a dry hacking cough  °3) swelling in the hands, feet or stomach  °4) if you have to sleep on extra pillows at night in order to breathe ° °Follow cardiac low salt diet and 1.5 lit/day fluid restriction. ° °If you have diabetes °Accuchecks 4 times/day, Once in AM empty stomach and then before each meal. °Log in all results and show them to your primary doctor at your next visit. °If any glucose reading is under 80 or above 300 call your primary MD immediately. ° °If you have Seizure/Convulsions/Epilepsy: °Please do not drive, operate heavy machinery, participate in activities at heights or participate in high speed sports until you have seen by Primary MD or a Neurologist and advised to do so again. ° °If you had Gastrointestinal Bleeding: °Please ask your Primary MD to check a complete blood count within one week of discharge or at your next visit. Your endoscopic/colonoscopic biopsies that are pending at the time of discharge, will also need to followed by your Primary MD. ° °Get Medicines reviewed and adjusted. °Please take all your medications with you for your next visit with your Primary MD ° °Please request your Primary MD to go over all hospital tests and procedure/radiological results at the follow up, please ask your  Primary MD to get all Hospital records sent to his/her office. ° °If you experience worsening of your admission symptoms, develop shortness of breath, life threatening emergency, suicidal or homicidal thoughts you must seek medical attention immediately by calling 911 or calling your MD immediately  if symptoms less severe. ° °You must read complete instructions/literature along with all the possible adverse reactions/side effects for all the Medicines you take and that have been prescribed to you. Take any new Medicines after you have completely understood and accpet all the possible adverse reactions/side effects.  ° °Do not drive or operate heavy machinery when taking Pain medications.  ° °Do not take more than prescribed Pain, Sleep and Anxiety Medications ° °Special Instructions: If you have smoked or chewed Tobacco  in the last 2 yrs please stop smoking, stop any regular Alcohol  and or any Recreational drug use. ° °Wear Seat belts while driving. ° °Please note °You were cared for by a hospitalist during your hospital stay. If you have any questions about your discharge medications or the care you received while you were in the hospital after you are discharged, you can call the unit and asked to speak with the hospitalist on call if the hospitalist that took care of you is not available. Once you are discharged, your primary care physician will handle any further medical issues. Please note that NO REFILLS for any discharge medications will be authorized once you are discharged, as it is imperative that you   return to your primary care physician (or establish a relationship with a primary care physician if you do not have one) for your aftercare needs so that they can reassess your need for medications and monitor your lab values.  You can reach the hospitalist office at phone [phone] or fax [phone]   If you do not have a primary care physician, you can call [phone] for a physician  referral.   Cellulitis, Adult Cellulitis is a skin infection. The infected area is usually red and tender. This condition occurs most often in the arms and lower legs. The infection can travel to the muscles, blood, and underlying tissue and become serious. It is very important to get treated for this condition. What are the causes? Cellulitis is caused by bacteria. The bacteria enter through a break in the skin, such as a cut, burn, insect bite, open sore, or crack. What increases the risk? This condition is more likely to occur in people who:  Have a weak defense system (immune system).  Have open wounds on the skin such as cuts, burns, bites, and scrapes. Bacteria can enter the body through these open wounds.  Are older.  Have diabetes.  Have a type of long-lasting (chronic) liver disease (cirrhosis) or kidney disease.  Use IV drugs.  What are the signs or symptoms? Symptoms of this condition include:  Redness, streaking, or spotting on the skin.  Swollen area of the skin.  Tenderness or pain when an area of the skin is touched.  Warm skin.  Fever.  Chills.  Blisters.  How is this diagnosed? This condition is diagnosed based on a medical history and physical exam. You may also have tests, including:  Blood tests.  Lab tests.  Imaging tests.  How is this treated? Treatment for this condition may include:  Medicines, such as antibiotic medicines or antihistamines.  Supportive care, such as rest and application of cold or warm cloths (cold or warm compresses) to the skin.  Hospital care, if the condition is severe.  The infection usually gets better within 1-2 days of treatment. Follow these instructions at home:  Take over-the-counter and prescription medicines only as told by your health care provider.  If you were prescribed an antibiotic medicine, take it as told by your health care provider. Do not stop taking the antibiotic even if you start to feel  better.  Drink enough fluid to keep your urine clear or pale yellow.  Do not touch or rub the infected area.  Raise (elevate) the infected area above the level of your heart while you are sitting or lying down.  Apply warm or cold compresses to the affected area as told by your health care provider.  Keep all follow-up visits as told by your health care provider. This is important. These visits let your health care provider make sure a more serious infection is not developing. Contact a health care provider if:  You have a fever.  Your symptoms do not improve within 1-2 days of starting treatment.  Your bone or joint underneath the infected area becomes painful after the skin has healed.  Your infection returns in the same area or another area.  You notice a swollen bump in the infected area.  You develop new symptoms.  You have a general ill feeling (malaise) with muscle aches and pains. Get help right away if:  Your symptoms get worse.  You feel very sleepy.  You develop vomiting or diarrhea that persists.  You notice  red streaks coming from the infected area.  Your red area gets larger or turns dark in color. This information is not intended to replace advice given to you by your health care provider. Make sure you discuss any questions you have with your health care provider. Document Released: 09/21/2005 Document Revised: 04/21/2016 Document Reviewed: 10/21/2015 Elsevier Interactive Patient Education  [employer].

## 2018-05-01 ENCOUNTER — Encounter: Payer: Medicare Other | Admitting: Sports Medicine

## 2018-05-02 NOTE — Progress Notes (Signed)
Information taken from PT entries documented in wrong encounter by error.

## 2018-05-03 ENCOUNTER — Ambulatory Visit (INDEPENDENT_AMBULATORY_CARE_PROVIDER_SITE_OTHER): Payer: Medicare Other | Admitting: Orthopedic Surgery

## 2018-05-03 ENCOUNTER — Encounter (INDEPENDENT_AMBULATORY_CARE_PROVIDER_SITE_OTHER): Payer: Self-pay | Admitting: Orthopedic Surgery

## 2018-05-03 VITALS — Ht 65.0 in | Wt 199.0 lb

## 2018-05-03 DIAGNOSIS — Z89511 Acquired absence of right leg below knee: Secondary | ICD-10-CM

## 2018-05-03 MED ORDER — OXYCODONE-ACETAMINOPHEN 10-325 MG PO TABS: 1.0000 | ORAL_TABLET | Freq: Three times a day (TID) | ORAL | 0 refills | Status: DC | PRN

## 2018-05-03 NOTE — Progress Notes (Signed)
Office Visit Note   Patient: [name]           Date of Birth: [birth year]           MRN: [id number] Visit Date: 05/03/2018              Requested by: Tamsen Roers, [place], [place] [address] PCP: Tamsen Roers, MD  Chief Complaint  Patient presents with  . Right Knee - Follow-up, Routine Post Op      HPI: Patient is status post transtibial amputation of the right 3 weeks ago.  She is wearing a 4 extra-large shrinker she states that the limb guard keeps falling off.  Assessment & Plan: Visit Diagnoses:  1. Acquired absence of right leg below knee [facility] [place])     Plan: Patient is to follow-up with biotech for a smaller stump shrinker she was given a prescription for a prosthesis on the right.  She was given instructions first knee extension exercises and a refill prescription for her Percocet.  Discussed that I would not increase her Neurontin which is currently 600 mg 3 times a day.  Patient was given instructions for protein supplements.  Follow-Up Instructions: Return in about 3 weeks (around 05/24/2018).   Ortho Exam  Patient is alert, oriented, no adenopathy, well-dressed, normal affect, normal respiratory effort. Examination patient's surgical incision is healing quite nicely.  There is some mild ischemic changes around the wound edges but there is no cellulitis no drainage no signs of infection.  She has full extension of the knee and the importance of working on knee extension was discussed.  Imaging: No results found. No images are attached to the encounter.  Labs: Lab Results  Component Value Date   HGBA1C 9.2 03/19/2018   HGBA1C 10.0 12/11/2017   HGBA1C 8.8 08/08/2017   ESRSEDRATE 55 (H) 02/19/2016   CRP 17.4 (H) 02/19/2016   LABURIC 5.8 02/19/2016   REPTSTATUS 04/27/2018 FINAL 04/25/2018   GRAMSTAIN  04/11/2018    RARE WBC PRESENT, PREDOMINANTLY PMN NO ORGANISMS SEEN    CULT  04/25/2018    NO GROWTH Performed at [facility],  [place] [address] [place], [place] [address]    LABORGA METHICILLIN RESISTANT STAPHYLOCOCCUS AUREUS 04/11/2018     Lab Results  Component Value Date   ALBUMIN 2.9 (L) 04/26/2018   ALBUMIN 2.0 (L) 04/17/2018   ALBUMIN 2.4 (L) 04/10/2018   LABURIC 5.8 02/19/2016    Body mass index is 33.12 kg/m.  Orders:  No orders of the defined types were placed in this encounter.  Meds ordered this encounter  Medications  . oxyCODONE-acetaminophen (PERCOCET) 10-325 MG tablet    Sig: Take 1 tablet by mouth every 8 (eight) hours as needed for pain.    Dispense:  20 tablet    Refill:  0     Procedures: No procedures performed  Clinical Data: No additional findings.  ROS:  All other systems negative, except as noted in the HPI. Review of Systems  Objective: Vital Signs: Ht 5\' 5"  (1.651 m)   Wt 199 lb (90.3 kg)   BMI 33.12 kg/m   Specialty Comments:  No specialty comments available.  PMFS History: Patient Active Problem List   Diagnosis Date Noted  . Restless leg syndrome 04/27/2018  . Depression with anxiety 04/27/2018  . Hyponatremia 04/26/2018  . Noncompliance with dietary restriction 04/26/2018  . Postoperative cellulitis of surgical wound 04/26/2018  . At high risk for falls 04/24/2018  .  Noncompliance with safety precautions 04/24/2018  . 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 PAD (peripheral artery disease) ([place])   . Poorly controlled type 2 diabetes mellitus ([place])   . Cutaneous abscess of right foot   . Subacute osteomyelitis, right ankle and foot ([place])   . Major depressive disorder, recurrent episode ([place]) 04/11/2018  . Diabetic ulcer of right midfoot associated with type 2 diabetes mellitus, with necrosis of bone ([place])   . Abscess of ankle   . Infectious synovitis   . Diabetic foot infection ([place])   . Sepsis ([place]) 04/09/2018  . Cancer of sigmoid colon ([place]) 03/09/2015  . OSA (obstructive sleep apnea) 07/04/2014  . Migraine without aura, with intractable  migraine, so stated, without mention of status migrainosus 03/26/2014  . Peripheral neuropathy 03/03/2014  . Abnormal stress test 02/12/2014  . CVA (cerebral infarction) 02/12/2014  . Chest pain   . Abnormal heart rhythm   . Edema   . Hypertension   . Hyperlipemia   . Hypercholesterolemia   . Other and unspecified hyperlipidemia 11/14/2013  . Type II diabetes mellitus, uncontrolled ([place]) 07/22/2013  . Essential hypertension 07/22/2013  . 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 Diabetic polyneuropathy ([place]) 07/22/2013   Past Medical History:  Diagnosis Date  . Anxiety   . Arthritis   . Bipolar disorder ([place])   . Broken jaw ([place])   . Broken wrist   . Chronic back pain   . Claudication ([place])    a. 12/2013 ABI's: R 0.97, L 0.94.  [name] COPD (chronic obstructive pulmonary disease) ([place])   . Depression   . Diabetic Charcot's foot ([place])   . Diabetic foot ulcer (H[facility])    chronic left foot ulcer , great toe/  hx recurrent foot ulcer bilaterally  . DJD (degenerative joint disease)   . Edema of both lower extremities   . Episode of memory loss   . GERD (gastroesophageal reflux disease)   . Headache(784.0)   . Hiatal hernia   . History of colon cancer, stage I dx 02-26-2015--- oncologist-- dr [name]--- per last note no recurrence   05-16-2015 s/p  Laparoscopic sigmoid colectomy w/ node bx's (negative nodes per path)  Stage I (pT1,N0,M0) Grade 2  . 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   per MRI multiple small strokes post cardiac cath. --  residual mild memory loss  . History of methicillin resistant staphylococcus aureus (MRSA)   . Hypercholesterolemia   . Insomnia   . Insulin dependent type 2 diabetes mellitus, uncontrolled ([place]) dx 2004   endocrinologist-  dr Dwyane Dee-- last A1c 8.8 in Aug2018:  pt is noncompliant w/ diet, states does note eat breakfast , her first main meal in afternoon  .  Neurogenic bladder   . Neuropathy, diabetic ([place])    hands and feet  . OSA (obstructive sleep apnea)    cpap intolerant  . Personality disorder ([place])   . Restless leg syndrome   . SOB (shortness of breath) on exertion   . Stroke ([place])   . SUI (stress urinary incontinence, female) S/P SLING 12-29-2011    Family History  Problem Relation Age of Onset  . Hypertension Mother   . Diabetes Mother   . Cancer - Other Mother        lymphoma   . Cancer - Other Father        lung, bladder cancer   . Heart attack Father   .  Cancer - Other Brother        bladder cancer     Past Surgical History:  Procedure Laterality Date  . ABDOMINAL HYSTERECTOMY    . AMPUTATION Right 04/13/2018   Procedure: RIGHT BELOW KNEE AMPUTATION;  Surgeon: Newt Minion, MD;  Location: [facility];  Service: Orthopedics;  Laterality: Right;  . ANTERIOR CERVICAL DECOMP/DISCECTOMY FUSION  2000   C5 - 7  . CARDIAC CATHETERIZATION  05-22-2008   DR SKAINS   NO SIGNIFECANT CAD/ NORMAL LV/  EF 65%/  NO WALL MOTION ABNORMALITIES  . CARPAL TUNNEL RELEASE Right 04-25-2013  . [place]; 1992  . COLONOSCOPY    . CYSTO N/A 04/30/2013   Procedure: CYSTOSCOPY;  Surgeon: Reece Packer, MD;  Location: [facility];  Service: Urology;  Laterality: N/A;  . CYSTOSCOPY MACROPLASTIQUE IMPLANT N/A 02/06/2018   Procedure: CYSTOSCOPY MACROPLASTIQUE IMPLANT;  Surgeon: Bjorn Loser, MD;  [facility];  Service: Urology;  Laterality: N/A;  . CYSTOSCOPY WITH INJECTION  05/04/2012   Procedure: CYSTOSCOPY WITH INJECTION;  Surgeon: Reece Packer, MD;  Location: [facility];  Service: Urology;  Laterality: N/A;  MACROPLASTIQUE INJECTION  . CYSTOSCOPY WITH INJECTION  08/28/2012   Procedure: CYSTOSCOPY WITH INJECTION;  Surgeon: Reece Packer, MD;  Location: [facility];  Service: Urology;  Laterality: N/A;  cysto and macroplastique   . FOOT SURGERY Bilateral   . HERNIA REPAIR   ?1996   "stomach"  . INCISION AND DRAINAGE OF WOUND Right 04/11/2018   Procedure: IRRIGATION AND DEBRIDEMENT WOUND right foot and right ankle;  Surgeon: Evelina Bucy, DPM;  Location: [facility];  Service: Podiatry;  Laterality: Right;  . KNEE ARTHROSCOPY W/ ALLOGRAFT IMPANT Left    graft x 2  . KNEE SURGERY     TOTAL 8 SURG'S  . LAPAROSCOPIC SIGMOID COLECTOMY N/A 04/22/2015   Procedure: LAPAROSCOPIC HAND ASSISTED SIGMOID COLECTOMY;  Surgeon: Erroll Luna, MD;  Location: [facility];  Service: General;  Laterality: N/A;  . LEFT HEART CATHETERIZATION WITH CORONARY ANGIOGRAM N/A 02/12/2014   Procedure: LEFT HEART CATHETERIZATION WITH CORONARY ANGIOGRAM;  Surgeon: Candee Furbish, MD;  Location: [facility];  Service: Cardiovascular;  Laterality: N/A;   No angiographically significant CAD; normal LVSF, LVEDP 51mmHg,  EF 55% (new finding ef 30% myoview 01-08-2014)  . LUMBAR FUSION    . MANDIBLE FRACTURE SURGERY    . MULTIPLE LAPAROSCOPIES FOR ENDOMETRIOSIS    . PUBOVAGINAL SLING  12/29/2011   Procedure: Gaynelle Arabian;  Surgeon: Reece Packer, MD;  Location: [facility];  Service: Urology;  Laterality: N/A;  cysto and sparc sling   . PUBOVAGINAL SLING N/A 04/30/2013   Procedure: REMOVAL OF VAGINAL MESH;  Surgeon: Reece Packer, MD;  [facility];  Service: Urology;  Laterality: N/A;  . RECONSTURCTION OF CONGENITAL UTERUS ANOMALY  1983  . REPEAT RECONSTRUCTION ACL LEFT KNEE/ SCREWS REMOVED  03-28-2000   CADAVER GRAFT  . TOTAL ABDOMINAL HYSTERECTOMY W/ BILATERAL SALPINGOOPHORECTOMY  1997  . TRANSTHORACIC ECHOCARDIOGRAM  02/13/2014   ef 45%, hypokinesis base inferior and base inferolateral walls  . UPPER GI ENDOSCOPY    . WOUND DEBRIDEMENT Right 09/05/2016   Procedure: DEBRIDEMENT WOUND WITH GRAFT RIGHT FOOT;  Surgeon: Landis Martins, DPM;  Location: [facility];  Service: Podiatry;  Laterality: Right;  . WRIST FRACTURE SURGERY     Social History   Occupational History  . Not on  file  Tobacco Use  .  Smoking status: Current Every Day Smoker    Packs/day: 4.00    Years: 45.00    Pack years: 180.00    Types: Cigarettes  . Smokeless tobacco: Never Used  . Tobacco comment: per pt started smoking  age 1  Substance and Sexual Activity  . Alcohol use: No    Alcohol/week: 0.0 oz  . Drug use: No  . Sexual activity: Yes

## 2018-05-04 ENCOUNTER — Other Ambulatory Visit: Payer: Self-pay | Admitting: Endocrinology

## 2018-05-07 ENCOUNTER — Telehealth: Payer: Self-pay | Admitting: *Deleted

## 2018-05-07 NOTE — Telephone Encounter (Signed)
I informed Laurita Quint, OT - [facility], we had not seen pt in office since her amputation and the surgeon would be the one to give PT or OT orders. Laurita Quint, OT states that is the opposite of what he is told. I told him we did not know she had be discharged from rehab and had not seen her yet.

## 2018-05-07 NOTE — Telephone Encounter (Signed)
Laurita Quint - [facility] OT is calling for orders for OT 1 x week for 4 weeks.

## 2018-05-08 ENCOUNTER — Encounter: Payer: Medicare Other | Attending: Physical Medicine & Rehabilitation | Admitting: Physical Medicine & Rehabilitation

## 2018-05-09 ENCOUNTER — Other Ambulatory Visit: Payer: Self-pay

## 2018-05-09 ENCOUNTER — Emergency Department (HOSPITAL_COMMUNITY): Payer: Medicare Other

## 2018-05-09 ENCOUNTER — Inpatient Hospital Stay (HOSPITAL_COMMUNITY)
Admission: EM | Admit: 2018-05-09 | Discharge: 2018-05-10 | DRG: 093 | Disposition: A | Discharge status: Home / Self Care | Payer: Medicare Other | Attending: Family Medicine | Admitting: Family Medicine

## 2018-05-09 DIAGNOSIS — G9341 Metabolic encephalopathy: Secondary | ICD-10-CM | POA: Diagnosis not present

## 2018-05-09 DIAGNOSIS — Z85038 Personal history of other malignant neoplasm of large intestine: Secondary | ICD-10-CM

## 2018-05-09 DIAGNOSIS — Z794 Long term (current) use of insulin: Secondary | ICD-10-CM

## 2018-05-09 DIAGNOSIS — G2581 Restless legs syndrome: Secondary | ICD-10-CM | POA: Diagnosis present

## 2018-05-09 DIAGNOSIS — E7800 Pure hypercholesterolemia, unspecified: Secondary | ICD-10-CM

## 2018-05-09 DIAGNOSIS — R4182 Altered mental status, unspecified: Secondary | ICD-10-CM

## 2018-05-09 DIAGNOSIS — E1149 Type 2 diabetes mellitus with other diabetic neurological complication: Secondary | ICD-10-CM

## 2018-05-09 DIAGNOSIS — I10 Essential (primary) hypertension: Secondary | ICD-10-CM | POA: Diagnosis present

## 2018-05-09 DIAGNOSIS — J449 Chronic obstructive pulmonary disease, unspecified: Secondary | ICD-10-CM | POA: Diagnosis not present

## 2018-05-09 DIAGNOSIS — Z79899 Other long term (current) drug therapy: Secondary | ICD-10-CM

## 2018-05-09 DIAGNOSIS — Z8673 Personal history of transient ischemic attack (TIA), and cerebral infarction without residual deficits: Secondary | ICD-10-CM

## 2018-05-09 DIAGNOSIS — G92 Toxic encephalopathy: Secondary | ICD-10-CM | POA: Diagnosis not present

## 2018-05-09 DIAGNOSIS — E1140 Type 2 diabetes mellitus with diabetic neuropathy, unspecified: Secondary | ICD-10-CM | POA: Diagnosis present

## 2018-05-09 DIAGNOSIS — E1151 Type 2 diabetes mellitus with diabetic peripheral angiopathy without gangrene: Secondary | ICD-10-CM | POA: Diagnosis present

## 2018-05-09 DIAGNOSIS — Z720 Tobacco use: Secondary | ICD-10-CM

## 2018-05-09 DIAGNOSIS — Z89511 Acquired absence of right leg below knee: Secondary | ICD-10-CM

## 2018-05-09 DIAGNOSIS — R4781 Slurred speech: Secondary | ICD-10-CM | POA: Diagnosis present

## 2018-05-09 DIAGNOSIS — Z833 Family history of diabetes mellitus: Secondary | ICD-10-CM

## 2018-05-09 DIAGNOSIS — K219 Gastro-esophageal reflux disease without esophagitis: Secondary | ICD-10-CM | POA: Diagnosis present

## 2018-05-09 DIAGNOSIS — E11610 Type 2 diabetes mellitus with diabetic neuropathic arthropathy: Secondary | ICD-10-CM | POA: Diagnosis not present

## 2018-05-09 DIAGNOSIS — F17210 Nicotine dependence, cigarettes, uncomplicated: Secondary | ICD-10-CM | POA: Diagnosis not present

## 2018-05-09 DIAGNOSIS — Z8614 Personal history of Methicillin resistant Staphylococcus aureus infection: Secondary | ICD-10-CM | POA: Diagnosis not present

## 2018-05-09 DIAGNOSIS — R0682 Tachypnea, not elsewhere classified: Secondary | ICD-10-CM | POA: Diagnosis present

## 2018-05-09 DIAGNOSIS — G4733 Obstructive sleep apnea (adult) (pediatric): Secondary | ICD-10-CM | POA: Diagnosis not present

## 2018-05-09 DIAGNOSIS — R000 Tachycardia, unspecified: Secondary | ICD-10-CM | POA: Diagnosis present

## 2018-05-09 DIAGNOSIS — F418 Other specified anxiety disorders: Secondary | ICD-10-CM | POA: Diagnosis not present

## 2018-05-09 DIAGNOSIS — Z8249 Family history of ischemic heart disease and other diseases of the circulatory system: Secondary | ICD-10-CM

## 2018-05-09 DIAGNOSIS — Z781 Physical restraint status: Secondary | ICD-10-CM | POA: Diagnosis not present

## 2018-05-09 DIAGNOSIS — E1165 Type 2 diabetes mellitus with hyperglycemia: Secondary | ICD-10-CM | POA: Diagnosis present

## 2018-05-09 DIAGNOSIS — F419 Anxiety disorder, unspecified: Secondary | ICD-10-CM | POA: Diagnosis present

## 2018-05-09 LAB — URINALYSIS, ROUTINE W REFLEX MICROSCOPIC
BACTERIA UA: NONE SEEN
BILIRUBIN URINE: NEGATIVE
Hgb urine dipstick: NEGATIVE
KETONES UR: NEGATIVE mg/dL
LEUKOCYTES UA: NEGATIVE
NITRITE: NEGATIVE
Protein, ur: NEGATIVE mg/dL
Specific Gravity, Urine: 1.024 (ref 1.005–1.030)
pH: 7 (ref 5.0–8.0)

## 2018-05-09 LAB — COMPREHENSIVE METABOLIC PANEL
ALT: 14 U/L (ref 14–54)
ANION GAP: 13 (ref 5–15)
AST: 17 U/L (ref 15–41)
Albumin: 3.9 g/dL (ref 3.5–5.0)
Alkaline Phosphatase: 110 U/L (ref 38–126)
BILIRUBIN TOTAL: 0.6 mg/dL (ref 0.3–1.2)
BUN: 17 mg/dL (ref 6–20)
CO2: 30 mmol/L (ref 22–32)
Calcium: 10 mg/dL (ref 8.9–10.3)
Chloride: 99 mmol/L — ABNORMAL LOW (ref 101–111)
Creatinine, Ser: 0.89 mg/dL (ref 0.44–1.00)
GFR calc Af Amer: >60 mL/min (ref >60)
Glucose, Bld: 198 mg/dL — ABNORMAL HIGH (ref 65–99)
POTASSIUM: 4.3 mmol/L (ref 3.5–5.1)
Sodium: 142 mmol/L (ref 135–145)
TOTAL PROTEIN: 7.9 g/dL (ref 6.5–8.1)

## 2018-05-09 LAB — BRAIN NATRIURETIC PEPTIDE: B Natriuretic Peptide: 6.8 pg/mL (ref 0.0–100.0)

## 2018-05-09 LAB — CBC WITH DIFFERENTIAL/PLATELET
Abs Immature Granulocytes: 0.1 10*3/uL (ref 0.0–0.1)
BASOS ABS: 0.1 10*3/uL (ref 0.0–0.1)
Basophils Relative: 1 %
EOS PCT: 1 %
Eosinophils Absolute: 0.1 10*3/uL (ref 0.0–0.7)
HCT: 43.5 % (ref 36.0–46.0)
HEMOGLOBIN: 14.2 g/dL (ref 12.0–15.0)
Immature Granulocytes: 0 %
LYMPHS PCT: 24 %
Lymphs Abs: 3.6 10*3/uL (ref 0.7–4.0)
MCH: 30.3 pg (ref 26.0–34.0)
MCHC: 32.6 g/dL (ref 30.0–36.0)
MCV: 92.8 fL (ref 78.0–100.0)
Monocytes Absolute: 0.5 10*3/uL (ref 0.1–1.0)
Monocytes Relative: 4 %
Neutro Abs: 10.7 10*3/uL — ABNORMAL HIGH (ref 1.7–7.7)
Neutrophils Relative %: 70 %
PLATELETS: 307 10*3/uL (ref 150–400)
RBC: 4.69 MIL/uL (ref 3.87–5.11)
RDW: 14.1 % (ref 11.5–15.5)
WBC: 15.1 10*3/uL — AB (ref 4.0–10.5)

## 2018-05-09 LAB — RAPID URINE DRUG SCREEN, HOSP PERFORMED
AMPHETAMINES: NOT DETECTED
Barbiturates: NOT DETECTED
Benzodiazepines: POSITIVE — AB
COCAINE: NOT DETECTED
OPIATES: NOT DETECTED
Tetrahydrocannabinol: NOT DETECTED

## 2018-05-09 LAB — ETHANOL: Alcohol, Ethyl (B): <10 mg/dL (ref <10)

## 2018-05-09 LAB — AMMONIA: AMMONIA: 24 umol/L (ref 9–35)

## 2018-05-09 LAB — CBG MONITORING, ED: Glucose-Capillary: 167 mg/dL — ABNORMAL HIGH (ref 65–99)

## 2018-05-09 LAB — PROTIME-INR
INR: 1.04
PROTHROMBIN TIME: 13.5 s (ref 11.4–15.2)

## 2018-05-09 LAB — SALICYLATE LEVEL: Salicylate Lvl: <7 mg/dL (ref 2.8–30.0)

## 2018-05-09 LAB — ACETAMINOPHEN LEVEL: Acetaminophen (Tylenol), Serum: <10 ug/mL — ABNORMAL LOW (ref 10–30)

## 2018-05-09 MED ORDER — HYDRALAZINE HCL 20 MG/ML IJ SOLN: 5.0000 mg | INTRAMUSCULAR | Status: DC | PRN

## 2018-05-09 MED ORDER — INSULIN GLARGINE 100 UNIT/ML ~~LOC~~ SOLN
24.0000 [IU] | Freq: Every day | SUBCUTANEOUS | Status: DC
  Administered 2018-05-10: 24 [IU] via SUBCUTANEOUS
  Filled 2018-05-09: qty 0.24

## 2018-05-09 MED ORDER — DOCUSATE SODIUM 100 MG PO CAPS: 100.0000 mg | ORAL_CAPSULE | Freq: Two times a day (BID) | ORAL | Status: DC

## 2018-05-09 MED ORDER — NICOTINE 21 MG/24HR TD PT24
21.0000 mg | MEDICATED_PATCH | Freq: Every day | TRANSDERMAL | Status: DC
  Administered 2018-05-09 – 2018-05-10 (×2): 21 mg via TRANSDERMAL
  Filled 2018-05-09 (×2): qty 1

## 2018-05-09 MED ORDER — INSULIN ASPART 100 UNIT/ML ~~LOC~~ SOLN
0.0000 [IU] | Freq: Three times a day (TID) | SUBCUTANEOUS | Status: DC
  Administered 2018-05-10: 2 [IU] via SUBCUTANEOUS

## 2018-05-09 MED ORDER — ACETAMINOPHEN 325 MG PO TABS
650.0000 mg | ORAL_TABLET | Freq: Four times a day (QID) | ORAL | Status: DC | PRN
  Administered 2018-05-10: 650 mg via ORAL
  Filled 2018-05-09: qty 2

## 2018-05-09 MED ORDER — SODIUM CHLORIDE 0.9 % IV BOLUS
1000.0000 mL | Freq: Once | INTRAVENOUS | Status: AC
  Administered 2018-05-09: 1000 mL via INTRAVENOUS

## 2018-05-09 MED ORDER — ACETAMINOPHEN 650 MG RE SUPP: 650.0000 mg | Freq: Four times a day (QID) | RECTAL | Status: DC | PRN

## 2018-05-09 MED ORDER — PANTOPRAZOLE SODIUM 40 MG PO TBEC
40.0000 mg | DELAYED_RELEASE_TABLET | Freq: Every day | ORAL | Status: DC
  Administered 2018-05-09 – 2018-05-10 (×2): 40 mg via ORAL
  Filled 2018-05-09 (×2): qty 1

## 2018-05-09 MED ORDER — BISACODYL 5 MG PO TBEC
5.0000 mg | DELAYED_RELEASE_TABLET | Freq: Every day | ORAL | Status: DC | PRN
Start: 2018-05-09 — End: 2018-05-10

## 2018-05-09 MED ORDER — NALOXONE HCL 0.4 MG/ML IJ SOLN
0.4000 mg | Freq: Once | INTRAMUSCULAR | Status: AC
  Administered 2018-05-09: 0.4 mg via INTRAVENOUS
  Filled 2018-05-09: qty 1

## 2018-05-09 MED ORDER — SODIUM CHLORIDE 0.9 % IV SOLN: INTRAVENOUS | Status: DC

## 2018-05-09 MED ORDER — ENOXAPARIN SODIUM 40 MG/0.4ML ~~LOC~~ SOLN
40.0000 mg | SUBCUTANEOUS | Status: DC
  Administered 2018-05-10: 40 mg via SUBCUTANEOUS
  Filled 2018-05-09 (×2): qty 0.4

## 2018-05-09 MED ORDER — LIDOCAINE 5 % EX PTCH: 1.0000 | MEDICATED_PATCH | Freq: Every day | CUTANEOUS | Status: DC | PRN

## 2018-05-09 MED ORDER — HYDROXYZINE HCL 10 MG PO TABS
10.0000 mg | ORAL_TABLET | Freq: Three times a day (TID) | ORAL | Status: DC | PRN
  Filled 2018-05-09: qty 1
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
## 2018-05-09 NOTE — ED Notes (Signed)
Urine analysis for UA and RUDS obtained; urine culture also collected and sent down to Main Lab with instructions to hold (until order is placed).

## 2018-05-09 NOTE — Care Management Note (Signed)
Case Management Note  Patient Details  Name: [name] MRN: [id number] Date of Birth: [birth year]  Subjective/Objective:                  Altered Mental Status  Action/Plan: EDCM spoke with the patient and her husband at the bedside. EDCSW informed EDCM the patient's son is concerned about the number of medications she is prescribed in addition to non-compliance with taking medications as prescribed. CM observed a grocery size plasticnearly bag full of medications on the floor at the bedside. The patient's son is not present at the time CM spoke with the patient and her husband. CM suggested he and the patient discuss concerns about the number of medications she is prescribed with her PCP. Her husband is not pleased with the care she is receiving from her PCP. CM provided him with the contact information for [facility] and explained the patient can see a different physician if she is not pleased with her care. He verbalizes understanding. Also encouraged the patient's husband ask the pharmacist at [facility] (where she gets her prescriptions filled) to review the medications she is prescribed (to assess for possible interactions). He verbalizes understanding. CM unable to complete a complete assessment of possible discharge needs due to the hospitalist and EDP arriving at the bedside to evaluate the patient. EDCM returned to the patient's room later but her husband was not present and she was not oriented. Unit CM to continue to follow for discharge needs.   Expected Discharge Date:   unknown               Expected Discharge Plan:  [place]  In-House Referral:  Clinical Social Work  Discharge planning Services  CM Consult  Post Acute Care Choice:    Choice offered to:     DME Arranged:    DME Agency:     HH Arranged:    [place] Agency:     Status of Service:  In process, will continue to follow  If discussed at Long Length of Stay Meetings, dates discussed:     Additional Comments:  [name] Schneiders, RN 05/09/2018, 7:47 PM

## 2018-05-09 NOTE — ED Notes (Signed)
Attempted to call report to 3W at [phone].  Nurse not able to take report. Will call me back.
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
## 2018-05-09 NOTE — ED Notes (Signed)
  Patient had unwitnessed fall at 2215.  Dr. Blaine Hamper was notified and Charge RN.  Patient has no injuries from fall and safety zone was filled out.

## 2018-05-09 NOTE — H&P (Signed)
History and Physical    [name] XHB:[id number] DOB: [birth year] DOA: 05/09/2018  Referring MD/NP/PA:   PCP: [name] Roers, MD   Patient coming from:  The patient is coming from home.  At baseline, pt is independent for most of ADL.   Chief Complaint: AMS  HPI: [name] is a 52 y.o. female with medical history significant of hyperlipidemia, diabetes mellitus, COPD, stroke, GERD, depression with anxiety, OSA not on CPAP, tobacco abuse, RLS, stage I colon cancer (s/p of cholectomy), who presents with altered mental status.  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 Pt was discharged at a stable condition on doxycycline.  Per her husband, patient finished 10-day course of doxycycline yesterday. Her surgical site seems to be healing well. No wrosening pain. Patient does not have fever or chills.  Husband states that the patient has been confused intermittently since weekend. Her talking does not make sense. For example, patient states that she was bitten by a snake.  Family member state there was no snakebite. husband said that patient likely takes a too many medications. Her husband brought her medication bottles to ED, which includes Valbenzine, gabapentin, tasimelteon, Ambien, Vrayular, ropinirole, Flexeril, nortriptyline, quetiapine, and melatonin.  Patient also Klonopin and oxycodone on her med list. Per her husband, patient does not have chest pain, shortness breath, no nausea, vomiting, diarrhea or abdominal pain.  No symptoms of UTI.  Patient moves all extremities normally.  Patient does not have facial droop, hearing loss.  Patient has slurred speech.  ED Course: pt was found to have WBC 15.1, INR 1.04, Tylenol less than 10, salicylate less than 7, ammonia 24, alcohol level less than 10, electrolytes renal function okay, temperature 97.7, tachycardia, tachypnea, oxygen saturation 97% on room air, chest x-ray is negative for acute  issues, CT head is negative for acute intracranial abnormalities.  Patient is admitted to telemetry bed as inpatient.  Review of Systems: Could not be reviewed due to altered mental status.  Allergy:  Allergies  Allergen Reactions  . Latex Itching, Rash and Other (See Comments)    Pt states she cannot use condoms - cause an infection.  Use of latex on skin is okay.  Tape causes rash  . Sweetening Enhancer [Flavoring Agent] Nausea And Vomiting and Other (See Comments)    HEADACHES  . Aspartame And Phenylalanine Nausea And Vomiting    HEADACHES  . Ibuprofen Other (See Comments)    HEADACHES  . Trazodone And Nefazodone Other (See Comments)    Hallucinations   . Triazolam Other (See Comments)    HALLUCINATIONS    Past Medical History:  Diagnosis Date  . Anxiety   . Arthritis   . Bipolar disorder ([place])   . Broken jaw ([place])   . Broken wrist   . Chronic back pain   . Claudication ([place])    a. 12/2013 ABI's: R 0.97, L 0.94.  [name] COPD (chronic obstructive pulmonary disease) ([place])   . Depression   . Diabetic Charcot's foot ([place])   . Diabetic foot ulcer (HCC)    chronic left foot ulcer , great toe/  hx recurrent foot ulcer bilaterally  . DJD (degenerative joint disease)   . Edema of both lower extremities   . Episode of memory loss   . GERD (gastroesophageal reflux disease)   . Headache(784.0)   . Hiatal hernia   . History of colon cancer, stage I dx 02-26-2015--- oncologist-- dr [name]--- per last  note no recurrence   05-16-2015 s/p  Laparoscopic sigmoid colectomy w/ node bx's (negative nodes per path)  Stage I (pT1,N0,M0) Grade 2  . 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   per MRI multiple small strokes post cardiac cath. --  residual mild memory loss  . History of methicillin resistant staphylococcus aureus (MRSA)   . Hypercholesterolemia   . Insomnia   . Insulin dependent type 2 diabetes mellitus, uncontrolled ([place]) dx 2004   endocrinologist-  dr  Dwyane Dee-- last A1c 8.8 in Aug2018:  pt is noncompliant w/ diet, states does note eat breakfast , her first main meal in afternoon  . Neurogenic bladder   . Neuropathy, diabetic ([place])    hands and feet  . OSA (obstructive sleep apnea)    cpap intolerant  . Personality disorder ([place])   . Restless leg syndrome   . SOB (shortness of breath) on exertion   . Stroke ([place])   . SUI (stress urinary incontinence, female) S/P SLING 12-29-2011    Past Surgical History:  Procedure Laterality Date  . ABDOMINAL HYSTERECTOMY    . AMPUTATION Right 04/13/2018   Procedure: RIGHT BELOW KNEE AMPUTATION;  Surgeon: Newt Minion, MD;  Location: [place];  Service: Orthopedics;  Laterality: Right;  . ANTERIOR CERVICAL DECOMP/DISCECTOMY FUSION  2000   C5 - 7  . CARDIAC CATHETERIZATION  05-22-2008   DR SKAINS   NO SIGNIFECANT CAD/ NORMAL LV/  EF 65%/  NO WALL MOTION ABNORMALITIES  . CARPAL TUNNEL RELEASE Right 04-25-2013  . [place]; 1992  . COLONOSCOPY    . CYSTO N/A 04/30/2013   Procedure: CYSTOSCOPY;  Surgeon: Reece Packer, MD;  Location: [facility];  Service: Urology;  Laterality: N/A;  . CYSTOSCOPY MACROPLASTIQUE IMPLANT N/A 02/06/2018   Procedure: CYSTOSCOPY MACROPLASTIQUE IMPLANT;  Surgeon: Bjorn Loser, MD;  [facility];  Service: Urology;  Laterality: N/A;  . CYSTOSCOPY WITH INJECTION  05/04/2012   Procedure: CYSTOSCOPY WITH INJECTION;  Surgeon: Reece Packer, MD;  Location: [place];  Service: Urology;  Laterality: N/A;  MACROPLASTIQUE INJECTION  . CYSTOSCOPY WITH INJECTION  08/28/2012   Procedure: CYSTOSCOPY WITH INJECTION;  Surgeon: Reece Packer, MD;  Location[facility];  Service: Urology;  Laterality: N/A;  cysto and macroplastique   . FOOT SURGERY Bilateral   . HERNIA REPAIR  ?1996   "stomach"  . INCISION AND DRAINAGE OF WOUND Right 04/11/2018   Procedure: IRRIGATION AND DEBRIDEMENT WOUND right foot and right  ankle;  Surgeon: Evelina Bucy, DPM;  Location: [facility];  Service: Podiatry;  Laterality: Right;  . KNEE ARTHROSCOPY W/ ALLOGRAFT IMPANT Left    graft x 2  . KNEE SURGERY     TOTAL 8 SURG'S  . LAPAROSCOPIC SIGMOID COLECTOMY N/A 04/22/2015   Procedure: LAPAROSCOPIC HAND ASSISTED SIGMOID COLECTOMY;  Surgeon: Erroll Luna, MD;  Location: [place];  Service: General;  Laterality: N/A;  . LEFT HEART CATHETERIZATION WITH CORONARY ANGIOGRAM N/A 02/12/2014   Procedure: LEFT HEART CATHETERIZATION WITH CORONARY ANGIOGRAM;  Surgeon: Candee Furbish, MD;  Location: [facility];  Service: Cardiovascular;  Laterality: N/A;   No angiographically significant CAD; normal LVSF, LVEDP 67mmHg,  EF 55% (new finding ef 30% myoview 01-08-2014)  . LUMBAR FUSION    . MANDIBLE FRACTURE SURGERY    . MULTIPLE LAPAROSCOPIES FOR ENDOMETRIOSIS    . PUBOVAGINAL SLING  12/29/2011   Procedure: Gaynelle Arabian;  Surgeon: Nicki Reaper  Reola Mosher, MD;  Location: [place];  Service: Urology;  Laterality: N/A;  cysto and sparc sling   . PUBOVAGINAL SLING N/A 04/30/2013   Procedure: REMOVAL OF VAGINAL MESH;  Surgeon: Reece Packer, MD;  [facility];  Service: Urology;  Laterality: N/A;  . RECONSTURCTION OF CONGENITAL UTERUS ANOMALY  1983  . REPEAT RECONSTRUCTION ACL LEFT KNEE/ SCREWS REMOVED  03-28-2000   CADAVER GRAFT  . TOTAL ABDOMINAL HYSTERECTOMY W/ BILATERAL SALPINGOOPHORECTOMY  1997  . TRANSTHORACIC ECHOCARDIOGRAM  02/13/2014   ef 45%, hypokinesis base inferior and base inferolateral walls  . UPPER GI ENDOSCOPY    . WOUND DEBRIDEMENT Right 09/05/2016   Procedure: DEBRIDEMENT WOUND WITH GRAFT RIGHT FOOT;  Surgeon: Landis Martins, DPM;  Location: [place];  Service: Podiatry;  Laterality: Right;  . WRIST FRACTURE SURGERY      Social History:  reports that she has been smoking cigarettes.  She has a 180.00 pack-year smoking history. She has never used smokeless tobacco. She reports that she does not drink alcohol  or use drugs.  Family History:  Family History  Problem Relation Age of Onset  . Hypertension Mother   . Diabetes Mother   . Cancer - Other Mother        lymphoma   . Cancer - Other Father        lung, bladder cancer   . Heart attack Father   . Cancer - Other Brother        bladder cancer      Prior to Admission medications   Medication Sig Start Date End Date Taking? Authorizing Provider  acetaminophen (TYLENOL) 325 MG tablet Take 1-2 tablets (325-650 mg total) by mouth every 4 (four) hours as needed for mild pain. 04/23/18   Love, Ivan Anchors, PA-C  bisacodyl (DULCOLAX) 5 MG EC tablet Take 1 tablet (5 mg total) by mouth daily as needed for moderate constipation. 04/16/18   Elgergawy, Silver Huguenin, MD  clonazePAM (KLONOPIN) 0.5 MG tablet Take 1 tablet (0.5 mg total) by mouth 3 (three) times daily as needed (anxiety). 04/26/18   Love, Ivan Anchors, PA-C  docusate sodium (COLACE) 100 MG capsule Take 1 capsule (100 mg total) by mouth 2 (two) times daily. 04/16/18   Elgergawy, Silver Huguenin, MD  doxycycline (VIBRA-TABS) 100 MG tablet Take 1 tablet (100 mg total) by mouth 2 (two) times daily. 04/30/18   Hongalgi, Lenis Dickinson, MD  empagliflozin (JARDIANCE) 25 MG TABS tablet Take 25 mg by mouth daily.    [provider]  gabapentin (NEURONTIN) 600 MG tablet TAKE 1 TABLET (600 MG TOTAL) BY MOUTH 2 (TWO) TIMES DAILY. 01/24/18   Patel, Arvin Collard K, DO  glucose blood (FREESTYLE LITE) test strip Use to check blood sugars up to 4 times daily. Dx Code E11.65 03/16/18   Elayne Snare, MD  insulin aspart (NOVOLOG) 100 UNIT/ML injection Inject 10-35 Units into the skin 3 (three) times daily with meals. Take 10 units before your main meal. If blood sugars are consistently over 250 after the meal start taking 14 units before your main meal. 35 UNITS AT SUPPER    [provider]  Insulin Glargine (BASAGLAR KWIKPEN) 100 UNIT/ML SOPN Inject 36 Units into the skin at bedtime.    [provider]  lidocaine (LIDODERM)  5 % Place 1 patch onto the skin daily as needed (pain). Remove & Discard patch within 12 hours or as directed by MD 02/27/18   Landis Martins, DPM  Melatonin 10 MG  CAPS Take 10 mg by mouth at bedtime.     [provider]  methocarbamol (ROBAXIN) 500 MG tablet Take 1 tablet (500 mg total) by mouth every 6 (six) hours as needed for muscle spasms. 04/26/18   Love, Ivan Anchors, PA-C  nicotine (NICODERM CQ - DOSED IN MG/24 HOURS) 21 mg/24hr patch Place 1 patch (21 mg total) onto the skin daily. 04/17/18   Elgergawy, Silver Huguenin, MD  nortriptyline (PAMELOR) 25 MG capsule Take 1 capsule (25 mg total) by mouth at bedtime. 04/26/18   Love, Ivan Anchors, PA-C  ondansetron (ZOFRAN) 4 MG tablet Take 1 tablet (4 mg total) by mouth every 6 (six) hours as needed for nausea. 04/16/18   Elgergawy, Silver Huguenin, MD  oxyCODONE 10 MG TABS Take 1 tablet (10 mg total) by mouth every 6 (six) hours as needed for moderate pain (pain score 4-6). 04/26/18   Love, Ivan Anchors, PA-C  oxyCODONE-acetaminophen (PERCOCET) 10-325 MG tablet Take 1 tablet by mouth every 8 (eight) hours as needed for pain. 05/03/18   Newt Minion, MD  pantoprazole (PROTONIX) 40 MG tablet Take 1 tablet (40 mg total) by mouth daily. 04/27/18   Love, Ivan Anchors, PA-C  QUEtiapine (SEROQUEL) 300 MG tablet Take 2 tablets (600 mg total) by mouth at bedtime. 04/23/18   Love, Ivan Anchors, PA-C  rOPINIRole (REQUIP) 2 MG tablet Take 1 tablet (2 mg total) by mouth daily. 04/26/18   Bary Leriche, PA-C    Physical Exam: Vitals:   05/09/18 1542 05/09/18 1551 05/09/18 1914  BP:  (!) 112/56   Pulse:  (!) 102   Resp:  20   Temp:   97.7 F (36.5 C)  TempSrc:   Oral  SpO2: 94% 97%    General: Not in acute distress.  Dry mucous membrane HEENT:       Eyes: PERRL, EOMI, no scleral icterus.       ENT: No discharge from the ears and nose, no pharynx injection, no tonsillar enlargement.        Neck: No JVD, no bruit, no mass felt. Heme: No neck lymph node enlargement. Cardiac: S1/S2, RRR,  No murmurs, No gallops or rubs. Respiratory: No rales, wheezing, rhonchi or rubs. GI: Soft, nondistended, nontender, no rebound pain, no organomegaly, BS present. GU: No hematuria Ext: No pitting leg edema bilaterally. 2+DP/PT pulse on the left leg. S/p of BKA in right leg. The surgical site is healing well with mild erythema, but no draining. Musculoskeletal: No joint deformities, No joint redness or warmth, no limitation of ROM in spin. Skin: No rashes.  Neuro: confused, not oriented X3, cranial nerves II-XII grossly intact, moves all extremities normally.  Psych: Patient is not psychotic, no suicidal or hemocidal ideation.  Labs on Admission: I have personally reviewed following labs and imaging studies  CBC: Recent Labs  Lab 05/09/18 1635  WBC 15.1*  NEUTROABS 10.7*  HGB 14.2  HCT 43.5  MCV 92.8  PLT 947   Basic Metabolic Panel: Recent Labs  Lab 05/09/18 1635  NA 142  K 4.3  CL 99*  CO2 30  GLUCOSE 198*  BUN 17  CREATININE 0.89  CALCIUM 10.0   GFR: Estimated Creatinine Clearance: 82.1 mL/min (by C-G formula based on SCr of 0.89 mg/dL). Liver Function Tests: Recent Labs  Lab 05/09/18 1635  AST 17  ALT 14  ALKPHOS 110  BILITOT 0.6  PROT 7.9  ALBUMIN 3.9   No results for input(s): LIPASE, AMYLASE in the last 168  hours. Recent Labs  Lab 05/09/18 1635  AMMONIA 24   Coagulation Profile: Recent Labs  Lab 05/09/18 1635  INR 1.04   Cardiac Enzymes: No results for input(s): CKTOTAL, CKMB, CKMBINDEX, TROPONINI in the last 168 hours. BNP (last 3 results) No results for input(s): PROBNP in the last 8760 hours. HbA1C: No results for input(s): HGBA1C in the last 72 hours. CBG: No results for input(s): GLUCAP in the last 168 hours. Lipid Profile: No results for input(s): CHOL, HDL, LDLCALC, TRIG, CHOLHDL, LDLDIRECT in the last 72 hours. Thyroid Function Tests: No results for input(s): TSH, T4TOTAL, FREET4, T3FREE, THYROIDAB in the last 72 hours. Anemia  Panel: No results for input(s): VITAMINB12, FOLATE, FERRITIN, TIBC, IRON, RETICCTPCT in the last 72 hours. Urine analysis:    Component Value Date/Time   COLORURINE YELLOW 05/09/2018 1910   APPEARANCEUR CLEAR 05/09/2018 1910   LABSPEC 1.024 05/09/2018 1910   PHURINE 7.0 05/09/2018 1910   GLUCOSEU >=500 (A) 05/09/2018 1910   GLUCOSEU >=1000 (A) 01/07/2015 1410   HGBUR NEGATIVE 05/09/2018 1910   BILIRUBINUR NEGATIVE 05/09/2018 1910   KETONESUR NEGATIVE 05/09/2018 1910   PROTEINUR NEGATIVE 05/09/2018 1910   UROBILINOGEN 0.2 04/23/2015 2355   NITRITE NEGATIVE 05/09/2018 1910   LEUKOCYTESUR NEGATIVE 05/09/2018 1910   Sepsis Labs: @LABRCNTIP (procalcitonin:4,lacticidven:4) )No results found for this or any previous visit (from the past 240 hour(s)).   Radiological Exams on Admission: Dg Chest 2 View  Result Date: 05/09/2018 CLINICAL DATA:  Altered mental status for 2 days. EXAM: CHEST - 2 VIEW COMPARISON:  Chest x-rays dated 04/26/2018 and 09/09/2017. FINDINGS: Heart size and mediastinal contours are stable. Probable chronic bronchitic changes within the perihilar regions. Lungs otherwise clear, although characterization somewhat limited by patient motion artifact. No pleural effusion or pneumothorax seen. No acute or suspicious osseous finding. IMPRESSION: 1. No active cardiopulmonary disease. No evidence of pneumonia or pulmonary edema. 2. Probable chronic bronchitic changes. Electronically Signed   By: Franki Cabot M.D.   On: 05/09/2018 18:02   Ct Head Wo Contrast  Result Date: 05/09/2018 CLINICAL DATA:  Altered mental status, slurred speech. Possible drug overdose. History of diabetes, headache, colon cancer, stroke. EXAM: CT HEAD WITHOUT CONTRAST TECHNIQUE: Contiguous axial images were obtained from the base of the skull through the vertex without intravenous contrast. COMPARISON:  CT HEAD April 20, 2018 and MRI of the head April 10, 2018 FINDINGS: BRAIN: No intraparenchymal hemorrhage,  mass effect nor midline shift. The ventricles and sulci are normal. No acute large vascular territory infarcts. No abnormal extra-axial fluid collections. Basal cisterns are patent. VASCULAR: Unremarkable. SKULL/SOFT TISSUES: No skull fracture. Small residual RIGHT parietal scalp hematoma. ORBITS/SINUSES: The included ocular globes and orbital contents are normal.The mastoid aircells and included paranasal sinuses are well-aerated. OTHER: Scattered dental caries incompletely imaged. IMPRESSION: Negative noncontrast CT HEAD. Electronically Signed   By: Elon Alas M.D.   On: 05/09/2018 18:48     EKG: Independently reviewed.  Sinus rhythm, QTC 502, nonspecific T wave change.   Assessment/Plan Principal Problem:   Acute metabolic encephalopathy Active Problems:   Essential hypertension   Diabetic neuropathy with neurologic complication (HCC)   Poorly controlled type 2 diabetes mellitus ([place])   Depression with anxiety   GERD (gastroesophageal reflux disease)   Tobacco abuse   Polypharmacy   Acute metabolic encephalopathy: Likely due to polypharmacy. Pt has 12 sedative medications on board, including Valbenzine, gabapentin, tasimelteon, Ambien, Vrayular, ropinirole, Flexeril, nortriptyline, quetiapine, and melatonin, Klonopin and oxycodone. Pt was tre significantated with 0.4  mg of Narcan in ED without improvement.  CT head is negative for acute intracranial abnormalities.  Patient moves all extremities normally.  No focal neurologic findings on physical examination,  Less likely to have new stroke.  -will admit to tele bed as inpt  -hold off all sedative medications -Frequent neuro checks, -Swallowing screen -IV fluid: 2 L normal saline bolus, followed by 25 cc/h -f/u UA, UDS  Essential hypertension: Blood pressure 112/56.  Patient is not taking medications at home. -IV Hydralazine prn  Poorly controlled type 2 diabetes mellitus with diabetic neuropathy and neurologic complication  ([place]): Last A1c 9.2 on 03/19/18, poorly controled. Patient is taking glargine insulin, Jardiance at home -will decrease Lantus dose from 36-24 -SSI  Depression with anxiety: -hold home meds now  GERD: -Protonix  Tobacco abuse: -nicotine patch  Postoperative cellulitis of surgical wound after R BKA: pt completed 10-day course of doxycycline.  Surgical site seems to healing well.  No worsening pain.  Patient has leukocytosis with WBC 15.1, but no fever. -Blood culture was sent out by ED physician -Hold off antibiotics, observe closely   DVT ppx: SQ Lovenox Code Status: Full code Family Communication:    Yes, patient's husband at bed side Disposition Plan:  Anticipate discharge back to previous home environment Consults called:  none Admission status:  Inpatient/tele      Date of Service 05/09/2018    Ivor Costa [facility] Pager [phone]  If 7PM-7AM, please contact night-coverage [email or web address] Password [facility] 05/09/2018, 8:04 PM

## 2018-05-09 NOTE — ED Provider Notes (Signed)
[facility] EMERGENCY DEPARTMENT Provider Note   CSN: [id number] Arrival date & time: 05/09/18  1541     History   Chief Complaint Chief Complaint  Patient presents with  . Altered Mental Status    HPI [name] is a 52 y.o. female.  HPI Patient presents to the emergency room for evaluation of altered mental status.  Patient has a complex medical history including diabetes and a below the knee amputation about 1 month ago.  Patient was readmitted to the hospital earlier this month after having fever and symptoms concerning for sepsis.  Patient had been recovering at home doing well until the last few days.  According to family members she started to become confused.  It started on Saturday and seemed to improve a little bit on Sunday.  Monday it was worse again.  Patient is very confused and is not making sense.  For example, when asked why she is here in the emergency room the patient states that because she was bitten by a snake.  Family member state there was no snakebite.  Patient does have a history of mental health problems and does take medications for pain and anxiety.  She self administers those medications.  Family members are worried that she may be taking too much.  No known fevers today.  No vomiting or diarrhea. Past Medical History:  Diagnosis Date  . Anxiety   . Arthritis   . Bipolar disorder ([place])   . Broken jaw ([place])   . Broken wrist   . Chronic back pain   . Claudication ([place])    a. 12/2013 ABI's: R 0.97, L 0.94.  [name] COPD (chronic obstructive pulmonary disease) ([place])   . Depression   . Diabetic Charcot's foot ([place])   . Diabetic foot ulcer (HCC)    chronic left foot ulcer , great toe/  hx recurrent foot ulcer bilaterally  . DJD (degenerative joint disease)   . Edema of both lower extremities   . Episode of memory loss   . GERD (gastroesophageal reflux disease)   . Headache(784.0)   . Hiatal hernia   . History of colon cancer, stage I dx  02-26-2015--- oncologist-- dr [name]--- per last note no recurrence   05-16-2015 s/p  Laparoscopic sigmoid colectomy w/ node bx's (negative nodes per path)  Stage I (pT1,N0,M0) Grade 2  . 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   per MRI multiple small strokes post cardiac cath. --  residual mild memory loss  . History of methicillin resistant staphylococcus aureus (MRSA)   . Hypercholesterolemia   . Insomnia   . Insulin dependent type 2 diabetes mellitus, uncontrolled ([place]) dx 2004   endocrinologist-  dr Dwyane Dee-- last A1c 8.8 in Aug2018:  pt is noncompliant w/ diet, states does note eat breakfast , her first main meal in afternoon  . Neurogenic bladder   . Neuropathy, diabetic ([place])    hands and feet  . OSA (obstructive sleep apnea)    cpap intolerant  . Personality disorder ([place])   . Restless leg syndrome   . SOB (shortness of breath) on exertion   . Stroke ([place])   . SUI (stress urinary incontinence, female) S/P SLING 12-29-2011    Patient Active Problem List   Diagnosis Date Noted  . Restless leg syndrome 04/27/2018  . Depression with anxiety 04/27/2018  . Hyponatremia 04/26/2018  . Noncompliance with dietary restriction 04/26/2018  . Postoperative cellulitis of surgical wound 04/26/2018  .  At high risk for falls 04/24/2018  . Noncompliance with safety precautions 04/24/2018  . 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 PAD (peripheral artery disease) ([place])   . Poorly controlled type 2 diabetes mellitus ([place])   . Cutaneous abscess of right foot   . Subacute osteomyelitis, right ankle and foot ([place])   . Major depressive disorder, recurrent episode ([place]) 04/11/2018  . Diabetic ulcer of right midfoot associated with type 2 diabetes mellitus, with necrosis of bone ([place])   . Abscess of ankle   . Infectious synovitis   . Diabetic foot infection ([place])   . Sepsis ([place]) 04/09/2018  . Cancer of sigmoid colon ([place]) 03/09/2015  . OSA (obstructive sleep  apnea) 07/04/2014  . Migraine without aura, with intractable migraine, so stated, without mention of status migrainosus 03/26/2014  . Peripheral neuropathy 03/03/2014  . Abnormal stress test 02/12/2014  . CVA (cerebral infarction) 02/12/2014  . Chest pain   . Abnormal heart rhythm   . Edema   . Hypertension   . Hyperlipemia   . Hypercholesterolemia   . Other and unspecified hyperlipidemia 11/14/2013  . Type II diabetes mellitus, uncontrolled ([place]) 07/22/2013  . Essential hypertension 07/22/2013  . 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 Diabetic polyneuropathy ([place]) 07/22/2013    Past Surgical History:  Procedure Laterality Date  . ABDOMINAL HYSTERECTOMY    . AMPUTATION Right 04/13/2018   Procedure: RIGHT BELOW KNEE AMPUTATION;  Surgeon: Newt Minion, MD;  Location: [facility];  Service: Orthopedics;  Laterality: Right;  . ANTERIOR CERVICAL DECOMP/DISCECTOMY FUSION  2000   C5 - 7  . CARDIAC CATHETERIZATION  05-22-2008   DR SKAINS   NO SIGNIFECANT CAD/ NORMAL LV/  EF 65%/  NO WALL MOTION ABNORMALITIES  . CARPAL TUNNEL RELEASE Right 04-25-2013  . [place]; 1992  . COLONOSCOPY    . CYSTO N/A 04/30/2013   Procedure: CYSTOSCOPY;  Surgeon: Reece Packer, MD;  Location: [facility];  Service: Urology;  Laterality: N/A;  . CYSTOSCOPY MACROPLASTIQUE IMPLANT N/A 02/06/2018   Procedure: CYSTOSCOPY MACROPLASTIQUE IMPLANT;  Surgeon: Bjorn Loser, MD;  Location[facility];  Service: Urology;  Laterality: N/A;  . CYSTOSCOPY WITH INJECTION  05/04/2012   Procedure: CYSTOSCOPY WITH INJECTION;  Surgeon: Reece Packer, MD;  Location: [facility];  Service: Urology;  Laterality: N/A;  MACROPLASTIQUE INJECTION  . CYSTOSCOPY WITH INJECTION  08/28/2012   Procedure: CYSTOSCOPY WITH INJECTION;  Surgeon: Reece Packer, MD;  Location: [facility];  Service: Urology;  Laterality:  N/A;  cysto and macroplastique   . FOOT SURGERY Bilateral   . HERNIA REPAIR  ?1996   "stomach"  . INCISION AND DRAINAGE OF WOUND Right 04/11/2018   Procedure: IRRIGATION AND DEBRIDEMENT WOUND right foot and right ankle;  Surgeon: Evelina Bucy, DPM;  Location: [facility];  Service: Podiatry;  Laterality: Right;  . KNEE ARTHROSCOPY W/ ALLOGRAFT IMPANT Left    graft x 2  . KNEE SURGERY     TOTAL 8 SURG'S  . LAPAROSCOPIC SIGMOID COLECTOMY N/A 04/22/2015   Procedure: LAPAROSCOPIC HAND ASSISTED SIGMOID COLECTOMY;  Surgeon: Erroll Luna, MD;  Location: [place];  Service: General;  Laterality: N/A;  . LEFT HEART CATHETERIZATION WITH CORONARY ANGIOGRAM N/A 02/12/2014   Procedure: LEFT HEART CATHETERIZATION WITH CORONARY ANGIOGRAM;  Surgeon: Candee Furbish, MD;  Location[facility];  Service: Cardiovascular;  Laterality: N/A;   No angiographically significant CAD; normal  LVSF, LVEDP 41mmHg,  EF 55% (new finding ef 30% myoview 01-08-2014)  . LUMBAR FUSION    . MANDIBLE FRACTURE SURGERY    . MULTIPLE LAPAROSCOPIES FOR ENDOMETRIOSIS    . PUBOVAGINAL SLING  12/29/2011   Procedure: Gaynelle Arabian;  Surgeon: Reece Packer, MD;  Location: [facility];  Service: Urology;  Laterality: N/A;  cysto and sparc sling   . PUBOVAGINAL SLING N/A 04/30/2013   Procedure: REMOVAL OF VAGINAL MESH;  Surgeon: Reece Packer, MD;  Location: [facility];  Service: Urology;  Laterality: N/A;  . RECONSTURCTION OF CONGENITAL UTERUS ANOMALY  1983  . REPEAT RECONSTRUCTION ACL LEFT KNEE/ SCREWS REMOVED  03-28-2000   CADAVER GRAFT  . TOTAL ABDOMINAL HYSTERECTOMY W/ BILATERAL SALPINGOOPHORECTOMY  1997  . TRANSTHORACIC ECHOCARDIOGRAM  02/13/2014   ef 45%, hypokinesis base inferior and base inferolateral walls  . UPPER GI ENDOSCOPY    . WOUND DEBRIDEMENT Right 09/05/2016   Procedure: DEBRIDEMENT WOUND WITH GRAFT RIGHT FOOT;  Surgeon: Landis Martins, DPM;  Location: [facility];  Service: Podiatry;  Laterality: Right;  .  WRIST FRACTURE SURGERY       OB History   None      Home Medications    Prior to Admission medications   Medication Sig Start Date End Date Taking? Authorizing Provider  acetaminophen (TYLENOL) 325 MG tablet Take 1-2 tablets (325-650 mg total) by mouth every 4 (four) hours as needed for mild pain. 04/23/18   Love, Ivan Anchors, PA-C  bisacodyl (DULCOLAX) 5 MG EC tablet Take 1 tablet (5 mg total) by mouth daily as needed for moderate constipation. 04/16/18   Elgergawy, Silver Huguenin, MD  clonazePAM (KLONOPIN) 0.5 MG tablet Take 1 tablet (0.5 mg total) by mouth 3 (three) times daily as needed (anxiety). 04/26/18   Love, Ivan Anchors, PA-C  docusate sodium (COLACE) 100 MG capsule Take 1 capsule (100 mg total) by mouth 2 (two) times daily. 04/16/18   Elgergawy, Silver Huguenin, MD  doxycycline (VIBRA-TABS) 100 MG tablet Take 1 tablet (100 mg total) by mouth 2 (two) times daily. 04/30/18   Hongalgi, Lenis Dickinson, MD  empagliflozin (JARDIANCE) 25 MG TABS tablet Take 25 mg by mouth daily.    [provider]  gabapentin (NEURONTIN) 600 MG tablet TAKE 1 TABLET (600 MG TOTAL) BY MOUTH 2 (TWO) TIMES DAILY. 01/24/18   Patel, Arvin Collard K, DO  glucose blood (FREESTYLE LITE) test strip Use to check blood sugars up to 4 times daily. Dx Code E11.65 03/16/18   Elayne Snare, MD  insulin aspart (NOVOLOG) 100 UNIT/ML injection Inject 10-35 Units into the skin 3 (three) times daily with meals. Take 10 units before your main meal. If blood sugars are consistently over 250 after the meal start taking 14 units before your main meal. 35 UNITS AT SUPPER    [provider]  Insulin Glargine (BASAGLAR KWIKPEN) 100 UNIT/ML SOPN Inject 36 Units into the skin at bedtime.    [provider]  lidocaine (LIDODERM) 5 % Place 1 patch onto the skin daily as needed (pain). Remove & Discard patch within 12 hours or as directed by MD 02/27/18   Landis Martins, DPM  Melatonin 10 MG CAPS Take 10 mg by mouth at bedtime.     [provider]  methocarbamol (ROBAXIN) 500 MG tablet Take 1 tablet (500 mg total) by mouth every 6 (six) hours as needed for muscle spasms. 04/26/18   Love, Ivan Anchors, PA-C  nicotine (NICODERM CQ - DOSED IN  MG/24 HOURS) 21 mg/24hr patch Place 1 patch (21 mg total) onto the skin daily. 04/17/18   Elgergawy, Silver Huguenin, MD  nortriptyline (PAMELOR) 25 MG capsule Take 1 capsule (25 mg total) by mouth at bedtime. 04/26/18   Love, Ivan Anchors, PA-C  ondansetron (ZOFRAN) 4 MG tablet Take 1 tablet (4 mg total) by mouth every 6 (six) hours as needed for nausea. 04/16/18   Elgergawy, Silver Huguenin, MD  oxyCODONE 10 MG TABS Take 1 tablet (10 mg total) by mouth every 6 (six) hours as needed for moderate pain (pain score 4-6). 04/26/18   Love, Ivan Anchors, PA-C  oxyCODONE-acetaminophen (PERCOCET) 10-325 MG tablet Take 1 tablet by mouth every 8 (eight) hours as needed for pain. 05/03/18   Newt Minion, MD  pantoprazole (PROTONIX) 40 MG tablet Take 1 tablet (40 mg total) by mouth daily. 04/27/18   Love, Ivan Anchors, PA-C  QUEtiapine (SEROQUEL) 300 MG tablet Take 2 tablets (600 mg total) by mouth at bedtime. 04/23/18   Love, Ivan Anchors, PA-C  rOPINIRole (REQUIP) 2 MG tablet Take 1 tablet (2 mg total) by mouth daily. 04/26/18   Bary Leriche, PA-C    Family History Family History  Problem Relation Age of Onset  . Hypertension Mother   . Diabetes Mother   . Cancer - Other Mother        lymphoma   . Cancer - Other Father        lung, bladder cancer   . Heart attack Father   . Cancer - Other Brother        bladder cancer     Social History Social History   Tobacco Use  . Smoking status: Current Every Day Smoker    Packs/day: 4.00    Years: 45.00    Pack years: 180.00    Types: Cigarettes  . Smokeless tobacco: Never Used  . Tobacco comment: per pt started smoking  age 2  Substance Use Topics  . Alcohol use: No    Alcohol/week: 0.0 oz  . Drug use: No     Allergies   Latex; Sweetening enhancer [flavoring agent]; Aspartame and  phenylalanine; Ibuprofen; Trazodone and nefazodone; and Triazolam   Review of Systems Review of Systems  All other systems reviewed and are negative.    Physical Exam Updated Vital Signs BP (!) 112/56 (BP Location: Right Arm)   Pulse (!) 102   Resp 20   SpO2 97%   Physical Exam  Constitutional: She appears lethargic. She appears distressed.  Disheveled, slow to respond, listless  HENT:  Head: Normocephalic and atraumatic.  Right Ear: External ear normal.  Left Ear: External ear normal.  Eyes: Conjunctivae are normal. Right eye exhibits no discharge. Left eye exhibits no discharge. No scleral icterus.  Neck: Normal range of motion. Neck supple. No tracheal deviation present.  Cardiovascular: Normal rate, regular rhythm and intact distal pulses.  Pulmonary/Chest: Effort normal and breath sounds normal. No stridor. No respiratory distress. She has no wheezes. She has no rales.  Abdominal: Soft. Bowel sounds are normal. She exhibits no distension. There is no tenderness. There is no rebound and no guarding.  Musculoskeletal: She exhibits no edema or tenderness.  Right below the knee amputation, no erythema or drainage around the wound  Neurological: She appears lethargic. No cranial nerve deficit (no facial droop, ) or sensory deficit. She exhibits normal muscle tone. She displays no seizure activity. Coordination abnormal. GCS eye subscore is 4. GCS verbal subscore is 4. GCS motor  subscore is 6.  Events are very slow however the patient does follow commands intermittently, she will move all extremities, no facial droop noted, speech is slurred at times unintelligible  Skin: Skin is warm and dry. No rash noted. She is not diaphoretic.  Psychiatric: She has a normal mood and affect.  Nursing note and vitals reviewed.    ED Treatments / Results  Labs (all labs ordered are listed, but only abnormal results are displayed) Labs Reviewed  COMPREHENSIVE METABOLIC PANEL - Abnormal;  Notable for the following components:      Result Value   Chloride 99 (*)    Glucose, Bld 198 (*)    All other components within normal limits  CBC WITH DIFFERENTIAL/PLATELET - Abnormal; Notable for the following components:   WBC 15.1 (*)    Neutro Abs 10.7 (*)    All other components within normal limits  ACETAMINOPHEN LEVEL - Abnormal; Notable for the following components:   Acetaminophen (Tylenol), Serum <10 (*)    All other components within normal limits  CULTURE, BLOOD (ROUTINE X 2)  CULTURE, BLOOD (ROUTINE X 2)  AMMONIA  ETHANOL  PROTIME-INR  SALICYLATE LEVEL  URINALYSIS, COMPLETE (UACMP) WITH MICROSCOPIC  RAPID URINE DRUG SCREEN, HOSP PERFORMED  URINALYSIS, ROUTINE W REFLEX MICROSCOPIC  I-STAT CG4 LACTIC ACID, ED  CBG MONITORING, ED  I-STAT ARTERIAL BLOOD GAS, ED    EKG EKG Interpretation  Date/Time:  Wednesday May 09 2018 18:31:07 EDT Ventricular Rate:  95 PR Interval:  154 QRS Duration: 92 QT Interval:  400 QTC Calculation: 502 R Axis:   50 Text Interpretation:  Normal sinus rhythm Prolonged QT Abnormal ECG No significant change since last tracing except qt prolongation Confirmed by Dorie Rank [id number]) on 05/09/2018 6:55:30 PM   Radiology Dg Chest 2 View  Result Date: 05/09/2018 CLINICAL DATA:  Altered mental status for 2 days. EXAM: CHEST - 2 VIEW COMPARISON:  Chest x-rays dated 04/26/2018 and 09/09/2017. FINDINGS: Heart size and mediastinal contours are stable. Probable chronic bronchitic changes within the perihilar regions. Lungs otherwise clear, although characterization somewhat limited by patient motion artifact. No pleural effusion or pneumothorax seen. No acute or suspicious osseous finding. IMPRESSION: 1. No active cardiopulmonary disease. No evidence of pneumonia or pulmonary edema. 2. Probable chronic bronchitic changes. Electronically Signed   By: Franki Cabot M.D.   On: 05/09/2018 18:02   Ct Head Wo Contrast  Result Date: 05/09/2018 CLINICAL DATA:   Altered mental status, slurred speech. Possible drug overdose. History of diabetes, headache, colon cancer, stroke. EXAM: CT HEAD WITHOUT CONTRAST TECHNIQUE: Contiguous axial images were obtained from the base of the skull through the vertex without intravenous contrast. COMPARISON:  CT HEAD April 20, 2018 and MRI of the head April 10, 2018 FINDINGS: BRAIN: No intraparenchymal hemorrhage, mass effect nor midline shift. The ventricles and sulci are normal. No acute large vascular territory infarcts. No abnormal extra-axial fluid collections. Basal cisterns are patent. VASCULAR: Unremarkable. SKULL/SOFT TISSUES: No skull fracture. Small residual RIGHT parietal scalp hematoma. ORBITS/SINUSES: The included ocular globes and orbital contents are normal.The mastoid aircells and included paranasal sinuses are well-aerated. OTHER: Scattered dental caries incompletely imaged. IMPRESSION: Negative noncontrast CT HEAD. Electronically Signed   By: Elon Alas M.D.   On: 05/09/2018 18:48    Procedures Procedures (including critical care time)  Medications Ordered in ED Medications  sodium chloride 0.9 % bolus 1,000 mL (1,000 mLs Intravenous New Bag/Given 05/09/18 1722)    And  0.9 %  sodium chloride infusion (  has no administration in time range)  naloxone [facility]) injection 0.4 mg (0.4 mg Intravenous Given 05/09/18 1721)     Initial Impression / Assessment and Plan / ED Course  I have reviewed the triage vital signs and the nursing notes.  Pertinent labs & imaging results that were available during my care of the patient were reviewed by me and considered in my medical decision making (see chart for details).  Clinical Course as of May 09 1918  Wed May 09, 2018  1828 WBC elevated but doubt clinically significant.  No signs of infection.  CT scan without any acute injury.  Electrolyte is unremarkable.   [JK]    Clinical Course User Index [JK] Dorie Rank, MD   She presented to emergency room for  altered mental status.  Her symptoms are suggestive of possible medication induced reaction.  Patient appears nontoxic.  Her vitals are stable.  No signs of infection her urinalysis still pending.  Plan on admission to the hospital for monitoring and further evaluation.   Final Clinical Impressions(s) / ED Diagnoses   Final diagnoses:  Altered mental status, unspecified altered mental status type       Dorie Rank, MD 05/09/18 1919

## 2018-05-09 NOTE — ED Triage Notes (Signed)
To ED via [facility] from home with son c/o to EMS that mother is "taking too many meds and not acting right" on arrival- pt is sleepy, sluggish to respond, slurred speech. Recent right BKA,

## 2018-05-10 DIAGNOSIS — E1149 Type 2 diabetes mellitus with other diabetic neurological complication: Secondary | ICD-10-CM | POA: Diagnosis not present

## 2018-05-10 DIAGNOSIS — R4182 Altered mental status, unspecified: Secondary | ICD-10-CM | POA: Diagnosis not present

## 2018-05-10 DIAGNOSIS — F418 Other specified anxiety disorders: Secondary | ICD-10-CM | POA: Diagnosis not present

## 2018-05-10 DIAGNOSIS — G92 Toxic encephalopathy: Principal | ICD-10-CM

## 2018-05-10 DIAGNOSIS — E1140 Type 2 diabetes mellitus with diabetic neuropathy, unspecified: Secondary | ICD-10-CM

## 2018-05-10 DIAGNOSIS — G9341 Metabolic encephalopathy: Secondary | ICD-10-CM | POA: Diagnosis present

## 2018-05-10 DIAGNOSIS — I10 Essential (primary) hypertension: Secondary | ICD-10-CM | POA: Diagnosis not present

## 2018-05-10 LAB — BASIC METABOLIC PANEL
ANION GAP: 12 (ref 5–15)
BUN: 11 mg/dL (ref 6–20)
CALCIUM: 9.3 mg/dL (ref 8.9–10.3)
CHLORIDE: 103 mmol/L (ref 101–111)
CO2: 26 mmol/L (ref 22–32)
CREATININE: 0.81 mg/dL (ref 0.44–1.00)
GFR calc non Af Amer: >60 mL/min (ref >60)
Glucose, Bld: 221 mg/dL — ABNORMAL HIGH (ref 65–99)
Potassium: 3.8 mmol/L (ref 3.5–5.1)
Sodium: 141 mmol/L (ref 135–145)

## 2018-05-10 LAB — MRSA PCR SCREENING: MRSA by PCR: NEGATIVE

## 2018-05-10 LAB — CBC
HCT: 39.9 % (ref 36.0–46.0)
Hemoglobin: 13 g/dL (ref 12.0–15.0)
MCH: 30.4 pg (ref 26.0–34.0)
MCHC: 32.6 g/dL (ref 30.0–36.0)
MCV: 93.4 fL (ref 78.0–100.0)
Platelets: 255 10*3/uL (ref 150–400)
RBC: 4.27 MIL/uL (ref 3.87–5.11)
RDW: 14.2 % (ref 11.5–15.5)
WBC: 13.1 10*3/uL — AB (ref 4.0–10.5)

## 2018-05-10 LAB — GLUCOSE, CAPILLARY: Glucose-Capillary: 162 mg/dL — ABNORMAL HIGH (ref 65–99)

## 2018-05-10 MED ORDER — MORPHINE SULFATE (PF) 2 MG/ML IV SOLN
2.0000 mg | Freq: Once | INTRAVENOUS | Status: AC
  Administered 2018-05-10: 2 mg via INTRAVENOUS
  Filled 2018-05-10: qty 1

## 2018-05-10 MED ORDER — GABAPENTIN 600 MG PO TABS: 600.0000 mg | ORAL_TABLET | Freq: Two times a day (BID) | ORAL | Status: DC

## 2018-05-10 MED ORDER — CLONAZEPAM 0.5 MG PO TABS: 0.5000 mg | ORAL_TABLET | Freq: Every day | ORAL | Status: DC | PRN

## 2018-05-10 MED ORDER — HALOPERIDOL LACTATE 5 MG/ML IJ SOLN
2.5000 mg | Freq: Once | INTRAMUSCULAR | Status: AC
  Administered 2018-05-10: 2.5 mg via INTRAVENOUS
  Filled 2018-05-10: qty 1

## 2018-05-10 MED ORDER — QUETIAPINE FUMARATE 300 MG PO TABS: 300.0000 mg | ORAL_TABLET | Freq: Every day | ORAL | Status: DC

## 2018-05-10 NOTE — Progress Notes (Signed)
Patient 52 year old white female came from [facility] Ed with  AMS ,hallusinating and already attempted x 2  To get oob without assist. Got report from ED rn that pt fell in the ED this night. Prior to coming  To the unit at 2319. Pt not following direction reoriented  repeatedly.  Pt high risk for fall  Pall precaution and safety measures discussed with pt. But needs reenforcement. MD on call made aware . Will continue to monitor pt closely.

## 2018-05-10 NOTE — Progress Notes (Signed)
Pt was combative and physically kicking  Nursing staff. Security called on pt  Pt calm down for a while Animal nutritionist at bed side, snack offered ,  Medicated with haldol 2.5 mg per MD order and  Posey restraint initiated. Pt reoriented and  Safety precaution in place. RN will monitor Pt closely

## 2018-05-10 NOTE — Discharge Instructions (Signed)
[name],  You were in the hospital because of significant confusion. This resolved and is secondary to too many medications. I have adjusted your medications. Please follow-up with your primary care physician.

## 2018-05-10 NOTE — Care Management Note (Signed)
Case Management Note  Patient Details  Name: [name] MRN: [id number] Date of Birth: June 02, 1966  Subjective/Objective:                    Action/Plan: Pt discharged home with self care. Pt has PCP, insurance and transportation home. No needs for HH at this time.   Expected Discharge Date:  05/10/18               Expected Discharge Plan:  [place]  In-House Referral:  Clinical Social Work  Discharge planning Services  CM Consult  Post Acute Care Choice:    Choice offered to:     DME Arranged:    DME Agency:     HH Arranged:    [place] Agency:     Status of Service:  Completed, signed off  If discussed at [employer], dates discussed:    Additional Comments:  Pollie Friar, RN 05/10/2018, 1:21 PM

## 2018-05-10 NOTE — Discharge Summary (Addendum)
Physician Discharge Summary  [name] IOX:[id number] DOB: [birth year] DOA: 05/09/2018  PCP: [name] Roers, MD  Admit date: 05/09/2018 Discharge date: 05/10/2018  Admitted From: Home Disposition: Home  Recommendations for Outpatient Follow-up:  1. Follow up with PCP in 1 week 2. Medications adjusted 3. Please follow up on the following pending results: Blood culture (no growth to date)  Home Health: None Equipment/Devices: None  Discharge Condition: Stable CODE STATUS: Full code Diet recommendation: Carb modified   Brief/Interim Summary:  Admission HPI written by [name] Costa, MD   Chief Complaint: AMS  HPI: [name] is a 52 y.o. female with medical history significant of hyperlipidemia, diabetes mellitus, COPD, stroke, GERD, depression with anxiety, OSA not on CPAP, tobacco abuse, RLS, stage I colon cancer (s/p of cholectomy), who presents with altered mental status.  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 Pt was discharged at a stable condition on doxycycline.  Per her husband, patient finished 10-day course of doxycycline yesterday. Her surgical site seems to be healing well. No wrosening pain. Patient does not have fever or chills.  Husband states that the patient has been confused intermittently since weekend. Her talking does not make sense. For example, patient states that she was bitten by a snake. Family member state there was no snakebite. husband said that patient likely takes a too many medications. Her husband brought her medication bottles to ED, which includes Valbenzine, gabapentin, tasimelteon, Ambien, Vrayular, ropinirole, Flexeril, nortriptyline, quetiapine, and melatonin.  Patient also Klonopin and oxycodone on her med list. Per her husband, patient does not have chest pain, shortness breath, no nausea, vomiting, diarrhea or abdominal pain.  No symptoms of UTI.  Patient moves all extremities normally.   Patient does not have facial droop, hearing loss.  Patient has slurred speech.  ED Course: pt was found to have WBC 15.1, INR 1.04, Tylenol less than 10, salicylate less than 7, ammonia 24, alcohol level less than 10, electrolytes renal function okay, temperature 97.7, tachycardia, tachypnea, oxygen saturation 97% on room air, chest x-ray is negative for acute issues, CT head is negative for acute intracranial abnormalities.  Patient is admitted to telemetry bed as inpatient.    Hospital course:  Acute toxic encephalopathy Secondary to polypharmacy. Mediations adjusted (as listed below). Patient was combative overnight and given haldol. Now at baseline. Plan discussed with patient and husband.  Essential hypertension Controlled.  Diabetes mellitus, type 2 Diabetic neuropathy Continued gabapentin and insulin regimen.  Depression Anxiety Continue home medications except Klonopin changed to daily as needed.  GERD Continue Protonix.  Tobacco abuse Nicotine patch  Right BKA Outpatient management  Discharge Diagnoses:  Principal Problem:   Toxic encephalopathy Active Problems:   Essential hypertension   Diabetic neuropathy with neurologic complication (HCC)   Poorly controlled type 2 diabetes mellitus ([place])   Depression with anxiety   GERD (gastroesophageal reflux disease)   Tobacco abuse   Polypharmacy    Discharge Instructions  Discharge Instructions    Call MD for:  extreme fatigue   Complete by:  As directed    Diet - low sodium heart healthy   Complete by:  As directed    Increase activity slowly   Complete by:  As directed      Allergies as of 05/10/2018      Reactions   Latex Itching, Rash, Other (See Comments)   Pt states she cannot use condoms - cause an infection.  Use  of latex on skin is okay.  Tape causes rash   Sweetening Enhancer [flavoring Agent] Nausea And Vomiting, Other (See Comments)   HEADACHES   Aspartame And Phenylalanine Nausea And  Vomiting   HEADACHES   Ibuprofen Other (See Comments)   HEADACHES   Trazodone And Nefazodone Other (See Comments)   Hallucinations   Triazolam Other (See Comments)   HALLUCINATIONS      Medication List    STOP taking these medications   doxycycline 100 MG tablet Commonly known as:  VIBRA-TABS   Melatonin 10 MG Caps   methocarbamol 500 MG tablet Commonly known as:  ROBAXIN   nortriptyline 25 MG capsule Commonly known as:  PAMELOR   Oxycodone HCl 10 MG Tabs   rOPINIRole 1 MG tablet Commonly known as:  REQUIP   rOPINIRole 2 MG tablet Commonly known as:  REQUIP   zolpidem 5 MG tablet Commonly known as:  AMBIEN     TAKE these medications   acetaminophen 325 MG tablet Commonly known as:  TYLENOL Take 1-2 tablets (325-650 mg total) by mouth every 4 (four) hours as needed for mild pain.   BASAGLAR KWIKPEN 100 UNIT/ML Sopn Inject 36 Units into the skin at bedtime.   bisacodyl 5 MG EC tablet Commonly known as:  DULCOLAX Take 1 tablet (5 mg total) by mouth daily as needed for moderate constipation.   clonazePAM 0.5 MG tablet Commonly known as:  KLONOPIN Take 1 tablet (0.5 mg total) by mouth daily as needed (anxiety). Do not take at night time. What changed:    when to take this  additional instructions   docusate sodium 100 MG capsule Commonly known as:  COLACE Take 1 capsule (100 mg total) by mouth 2 (two) times daily.   gabapentin 600 MG tablet Commonly known as:  NEURONTIN Take 1 tablet (600 mg total) by mouth 2 (two) times daily. Take in the morning and afternoon. Do not take at night time. What changed:  additional instructions   glucose blood test strip Commonly known as:  FREESTYLE LITE Use to check blood sugars up to 4 times daily. Dx Code E11.65   HETLIOZ 20 MG Caps Generic drug:  Tasimelteon Take 20 mg by mouth at bedtime.   INGREZZA 80 MG Caps Generic drug:  Valbenazine Tosylate Take 80 mg by mouth at bedtime.   insulin aspart 100 UNIT/ML  injection Commonly known as:  novoLOG Inject 10-35 Units into the skin 3 (three) times daily with meals. Take 10 units before your main meal. If blood sugars are consistently over 250 after the meal start taking 14 units before your main meal. 35 UNITS AT SUPPER   JARDIANCE 25 MG Tabs tablet Generic drug:  empagliflozin Take 25 mg by mouth daily.   lidocaine 5 % Commonly known as:  LIDODERM Place 1 patch onto the skin daily as needed (pain). Remove & Discard patch within 12 hours or as directed by MD   nicotine 21 mg/24hr patch Commonly known as:  NICODERM CQ - dosed in mg/24 hours Place 1 patch (21 mg total) onto the skin daily.   ondansetron 4 MG tablet Commonly known as:  ZOFRAN Take 1 tablet (4 mg total) by mouth every 6 (six) hours as needed for nausea.   oxyCODONE-acetaminophen 10-325 MG tablet Commonly known as:  PERCOCET Take 1 tablet by mouth every 8 (eight) hours as needed for pain.   pantoprazole 40 MG tablet Commonly known as:  PROTONIX Take 1 tablet (40 mg total) by mouth  daily.   QUEtiapine 300 MG tablet Commonly known as:  SEROQUEL Take 1 tablet (300 mg total) by mouth at bedtime. What changed:  how much to take   VRAYLAR 4.5 MG Caps Generic drug:  Cariprazine HCl Take 1 capsule by mouth at bedtime.      Follow-up Information    Little, James, MD. Schedule an appointment as soon as possible for a visit in 1 week(s).   Specialty:  Family Medicine Why:  Hospital follow-up Contact information: [address] [place] [address] [phone]          Allergies  Allergen Reactions  . Latex Itching, Rash and Other (See Comments)    Pt states she cannot use condoms - cause an infection.  Use of latex on skin is okay.  Tape causes rash  . Sweetening Enhancer [Flavoring Agent] Nausea And Vomiting and Other (See Comments)    HEADACHES  . Aspartame And Phenylalanine Nausea And Vomiting    HEADACHES  . Ibuprofen Other (See Comments)    HEADACHES  .  Trazodone And Nefazodone Other (See Comments)    Hallucinations   . Triazolam Other (See Comments)    HALLUCINATIONS    Consultations:  None   Procedures/Studies: Dg Chest 2 View  Result Date: 05/09/2018 CLINICAL DATA:  Altered mental status for 2 days. EXAM: CHEST - 2 VIEW COMPARISON:  Chest x-rays dated 04/26/2018 and 09/09/2017. FINDINGS: Heart size and mediastinal contours are stable. Probable chronic bronchitic changes within the perihilar regions. Lungs otherwise clear, although characterization somewhat limited by patient motion artifact. No pleural effusion or pneumothorax seen. No acute or suspicious osseous finding. IMPRESSION: 1. No active cardiopulmonary disease. No evidence of pneumonia or pulmonary edema. 2. Probable chronic bronchitic changes. Electronically Signed   By: Franki Cabot M.D.   On: 05/09/2018 18:02   Dg Chest 2 View  Result Date: 04/26/2018 CLINICAL DATA:  52 year old female with fever status post recent amputation EXAM: CHEST - 2 VIEW COMPARISON:  Prior chest x-ray 04/09/2018 FINDINGS: The lungs are clear and negative for focal airspace consolidation, pulmonary edema or suspicious pulmonary nodule. No pleural effusion or pneumothorax. Cardiac and mediastinal contours are within normal limits. No acute fracture or lytic or blastic osseous lesions. The visualized upper abdominal bowel gas pattern is unremarkable. Incompletely imaged anterior cervical fusion hardware. IMPRESSION: No active cardiopulmonary disease. Electronically Signed   By: Jacqulynn Cadet M.D.   On: 04/26/2018 21:14   Dg Tibia/fibula Right  Result Date: 04/26/2018 CLINICAL DATA:  52 year old female with fever. Recent right below-the-knee amputation EXAM: RIGHT TIBIA AND FIBULA - 2 VIEW COMPARISON:  None. FINDINGS: Surgical changes of below the knee amputation. Surgical staples are present. The osteotomy sites are well-marginated. No evidence of subcutaneous emphysema. No conventional radiographic  evidence of osteomyelitis. Mild soft tissue edema at the stump, not unexpected postoperative period. IMPRESSION: 1. Surgical changes of right below-the-knee amputation with expected postoperative edema. 2. No evidence of subcutaneous emphysema or unexpected bony changes. Electronically Signed   By: Jacqulynn Cadet M.D.   On: 04/26/2018 21:13   Ct Head Wo Contrast  Result Date: 05/09/2018 CLINICAL DATA:  Altered mental status, slurred speech. Possible drug overdose. History of diabetes, headache, colon cancer, stroke. EXAM: CT HEAD WITHOUT CONTRAST TECHNIQUE: Contiguous axial images were obtained from the base of the skull through the vertex without intravenous contrast. COMPARISON:  CT HEAD April 20, 2018 and MRI of the head April 10, 2018 FINDINGS: BRAIN: No intraparenchymal hemorrhage, mass effect nor  midline shift. The ventricles and sulci are normal. No acute large vascular territory infarcts. No abnormal extra-axial fluid collections. Basal cisterns are patent. VASCULAR: Unremarkable. SKULL/SOFT TISSUES: No skull fracture. Small residual RIGHT parietal scalp hematoma. ORBITS/SINUSES: The included ocular globes and orbital contents are normal.The mastoid aircells and included paranasal sinuses are well-aerated. OTHER: Scattered dental caries incompletely imaged. IMPRESSION: Negative noncontrast CT HEAD. Electronically Signed   By: Elon Alas M.D.   On: 05/09/2018 18:48   Ct Head Wo Contrast  Result Date: 04/20/2018 CLINICAL DATA:  Fall with headache. EXAM: CT HEAD WITHOUT CONTRAST TECHNIQUE: Contiguous axial images were obtained from the base of the skull through the vertex without intravenous contrast. COMPARISON:  Head CT 04/09/2018 FINDINGS: Brain: No mass lesion, intraparenchymal hemorrhage or extra-axial collection. No evidence of acute cortical infarct. Normal appearance of the brain parenchyma and extra axial spaces for age. Vascular: No hyperdense vessel or unexpected vascular  calcification. Skull: Right parietal scalp hematoma.  No skull fracture. Sinuses/Orbits: No sinus fluid levels or advanced mucosal thickening. No mastoid effusion. Normal orbits. IMPRESSION: 1. No acute intracranial abnormality. 2. Right parietal scalp hematoma without skull fracture. Electronically Signed   By: Ulyses Jarred M.D.   On: 04/20/2018 23:02   Mr Brain Wo Contrast  Result Date: 04/10/2018 CLINICAL DATA:  Altered mental status EXAM: MRI HEAD WITHOUT CONTRAST TECHNIQUE: Multiplanar, multiecho pulse sequences of the brain and surrounding structures were obtained without intravenous contrast. COMPARISON:  Head CT 04/09/2018 FINDINGS: The examination had to be discontinued prior to completion due to patient confusion and inability to cooperate with the technologist's instructions. Axial and coronal diffusion-weighted imaging with corresponding ADC maps, sagittal T1-weighted imaging, axial T2 weighted and FLAIR sequences were obtained. The study is degraded by motion, despite efforts to reduce this artifact, including utilization of motion-resistant MR sequences. The findings of the study are interpreted in the context of reduced sensitivity/specificity. BRAIN: The midline structures are normal. There is no acute infarct or acute hemorrhage. No mass lesion, hydrocephalus, dural abnormality or extra-axial collection. The brain parenchymal signal is normal for the patient's age. No age-advanced or lobar predominant atrophy. No chronic microhemorrhage or superficial siderosis. VASCULAR: Major intracranial arterial and venous sinus flow voids are preserved. SKULL AND UPPER CERVICAL SPINE: The visualized skull base, calvarium, upper cervical spine and extracranial soft tissues are normal. SINUSES/ORBITS: No fluid levels or advanced mucosal thickening. No mastoid or middle ear effusion. Normal orbits. IMPRESSION: Motion degraded and truncated MRI of the brain without visible abnormality. Electronically Signed    By: Ulyses Jarred M.D.   On: 04/10/2018 17:28      Subjective: No issues.   Discharge Exam: Vitals:   05/09/18 2319 05/10/18 0517  BP: (!) 169/93 (!) 157/89  Pulse: 94 97  Resp: (!) 22 20  Temp: 98 F (36.7 C) (!) 97.5 F (36.4 C)  SpO2: 95% 97%   Vitals:   05/09/18 1914 05/09/18 2302 05/09/18 2319 05/10/18 0517  BP:  (!) 138/91 (!) 169/93 (!) 157/89  Pulse:  98 94 97  Resp:  18 (!) 22 20  Temp: 97.7 F (36.5 C) 98 F (36.7 C) 98 F (36.7 C) (!) 97.5 F (36.4 C)  TempSrc: Oral Oral Oral Oral  SpO2:  100% 95% 97%    General: Pt is alert, awake, not in acute distress Cardiovascular: RRR, S1/S2 +, no rubs, no gallops Respiratory: CTA bilaterally, no wheezing, no rhonchi Abdominal: Soft, NT, ND, bowel sounds + Extremities: no edema, no cyanosis. Right BKA  Neuro: alert, oriented x3    The results of significant diagnostics from this hospitalization (including imaging, microbiology, ancillary and laboratory) are listed below for reference.     Microbiology: Recent Results (from the past 240 hour(s))  MRSA PCR Screening     Status: None   Collection Time: 05/10/18  1:04 AM  Result Value Ref Range Status   MRSA by PCR NEGATIVE NEGATIVE Final    Comment:        The GeneXpert MRSA Assay (FDA approved for NASAL specimens only), is one component of a comprehensive MRSA colonization surveillance program. It is not intended to diagnose MRSA infection nor to guide or monitor treatment for MRSA infections. Performed at [facility], [place] [address], [place], [place] [address]      Labs: BNP (last 3 results) Recent Labs    05/09/18 1635  BNP 6.8   Basic Metabolic Panel: Recent Labs  Lab 05/09/18 1635 05/10/18 0407  NA 142 141  K 4.3 3.8  CL 99* 103  CO2 30 26  GLUCOSE 198* 221*  BUN 17 11  CREATININE 0.89 0.81  CALCIUM 10.0 9.3   Liver Function Tests: Recent Labs  Lab 05/09/18 1635  AST 17  ALT 14  ALKPHOS 110  BILITOT 0.6  PROT 7.9    ALBUMIN 3.9   No results for input(s): LIPASE, AMYLASE in the last 168 hours. Recent Labs  Lab 05/09/18 1635  AMMONIA 24   CBC: Recent Labs  Lab 05/09/18 1635 05/10/18 0407  WBC 15.1* 13.1*  NEUTROABS 10.7*  --   HGB 14.2 13.0  HCT 43.5 39.9  MCV 92.8 93.4  PLT 307 255   Cardiac Enzymes: No results for input(s): CKTOTAL, CKMB, CKMBINDEX, TROPONINI in the last 168 hours. BNP: Invalid input(s): POCBNP CBG: Recent Labs  Lab 05/09/18 2300 05/10/18 0926  GLUCAP 167* 162*   D-Dimer No results for input(s): DDIMER in the last 72 hours. Hgb A1c No results for input(s): HGBA1C in the last 72 hours. Lipid Profile No results for input(s): CHOL, HDL, LDLCALC, TRIG, CHOLHDL, LDLDIRECT in the last 72 hours. Thyroid function studies No results for input(s): TSH, T4TOTAL, T3FREE, THYROIDAB in the last 72 hours.  Invalid input(s): FREET3 Anemia work up No results for input(s): VITAMINB12, FOLATE, FERRITIN, TIBC, IRON, RETICCTPCT in the last 72 hours. Urinalysis    Component Value Date/Time   COLORURINE YELLOW 05/09/2018 1910   APPEARANCEUR CLEAR 05/09/2018 1910   LABSPEC 1.024 05/09/2018 1910   PHURINE 7.0 05/09/2018 1910   GLUCOSEU >=500 (A) 05/09/2018 1910   GLUCOSEU >=1000 (A) 01/07/2015 1410   HGBUR NEGATIVE 05/09/2018 1910   BILIRUBINUR NEGATIVE 05/09/2018 1910   KETONESUR NEGATIVE 05/09/2018 1910   PROTEINUR NEGATIVE 05/09/2018 1910   UROBILINOGEN 0.2 04/23/2015 2355   NITRITE NEGATIVE 05/09/2018 1910   LEUKOCYTESUR NEGATIVE 05/09/2018 1910   Sepsis Labs Invalid input(s): PROCALCITONIN,  WBC,  LACTICIDVEN Microbiology Recent Results (from the past 240 hour(s))  MRSA PCR Screening     Status: None   Collection Time: 05/10/18  1:04 AM  Result Value Ref Range Status   MRSA by PCR NEGATIVE NEGATIVE Final    Comment:        The GeneXpert MRSA Assay (FDA approved for NASAL specimens only), is one component of a comprehensive MRSA colonization surveillance  program. It is not intended to diagnose MRSA infection nor to guide or monitor treatment for MRSA infections. Performed at [facility], [place] [address] [place], [place] [address]  SIGNED:   Cordelia Poche, MD [facility] 05/10/2018, 11:33 AM

## 2018-05-10 NOTE — Progress Notes (Signed)
Patient and husband anxiety to leave hospital, have advised that there are a few discharge tasks remaining, including case management. However, they have demanded charge RN assist them in transporting to exit per wc.

## 2018-05-11 LAB — FUNGUS CULTURE WITH STAIN

## 2018-05-11 LAB — FUNGUS CULTURE RESULT

## 2018-05-11 LAB — FUNGAL ORGANISM REFLEX

## 2018-05-14 ENCOUNTER — Other Ambulatory Visit: Payer: Self-pay

## 2018-05-14 ENCOUNTER — Telehealth: Payer: Self-pay | Admitting: Sports Medicine

## 2018-05-14 ENCOUNTER — Emergency Department (HOSPITAL_COMMUNITY): Payer: Medicare Other

## 2018-05-14 ENCOUNTER — Emergency Department (HOSPITAL_COMMUNITY)
Admission: EM | Admit: 2018-05-14 | Discharge: 2018-05-14 | Disposition: A | Discharge status: Home / Self Care | Payer: Medicare Other | Attending: Emergency Medicine | Admitting: Emergency Medicine

## 2018-05-14 ENCOUNTER — Encounter (HOSPITAL_COMMUNITY): Payer: Self-pay | Admitting: Emergency Medicine

## 2018-05-14 DIAGNOSIS — S88111A Complete traumatic amputation at level between knee and ankle, right lower leg, initial encounter: Secondary | ICD-10-CM

## 2018-05-14 DIAGNOSIS — Z794 Long term (current) use of insulin: Secondary | ICD-10-CM | POA: Insufficient documentation

## 2018-05-14 DIAGNOSIS — Z8673 Personal history of transient ischemic attack (TIA), and cerebral infarction without residual deficits: Secondary | ICD-10-CM | POA: Insufficient documentation

## 2018-05-14 DIAGNOSIS — J449 Chronic obstructive pulmonary disease, unspecified: Secondary | ICD-10-CM | POA: Insufficient documentation

## 2018-05-14 DIAGNOSIS — I10 Essential (primary) hypertension: Secondary | ICD-10-CM | POA: Insufficient documentation

## 2018-05-14 DIAGNOSIS — W19XXXA Unspecified fall, initial encounter: Secondary | ICD-10-CM

## 2018-05-14 DIAGNOSIS — Z79899 Other long term (current) drug therapy: Secondary | ICD-10-CM | POA: Diagnosis not present

## 2018-05-14 DIAGNOSIS — E119 Type 2 diabetes mellitus without complications: Secondary | ICD-10-CM | POA: Diagnosis not present

## 2018-05-14 DIAGNOSIS — Z89511 Acquired absence of right leg below knee: Secondary | ICD-10-CM | POA: Diagnosis not present

## 2018-05-14 DIAGNOSIS — Z91040 Latex allergy status: Secondary | ICD-10-CM | POA: Insufficient documentation

## 2018-05-14 DIAGNOSIS — F17210 Nicotine dependence, cigarettes, uncomplicated: Secondary | ICD-10-CM | POA: Insufficient documentation

## 2018-05-14 DIAGNOSIS — M25561 Pain in right knee: Secondary | ICD-10-CM | POA: Insufficient documentation

## 2018-05-14 LAB — CULTURE, BLOOD (ROUTINE X 2)
CULTURE: NO GROWTH
CULTURE: NO GROWTH
SPECIAL REQUESTS: ADEQUATE

## 2018-05-14 MED ORDER — OXYCODONE-ACETAMINOPHEN 5-325 MG PO TABS
2.0000 | ORAL_TABLET | Freq: Once | ORAL | Status: AC
  Administered 2018-05-14: 2 via ORAL
  Filled 2018-05-14: qty 2

## 2018-05-14 NOTE — ED Provider Notes (Signed)
Medical screening examination/treatment/procedure(s) were conducted as a shared visit with non-physician practitioner(s) and myself.  I personally evaluated the patient during the encounter. Briefly, the patient is a 52 yo F here with pain R BKA stump site after mechanical fall. Pt awake, alert, in NAD. VSS. Mild tachycardia on arrival 2/2 pain, resolved spontaneously. Wound is c/d/i. No dehiscence or bleeding. No other medical issues. This seems to be partially 2/2 lack of adequate support at home - I've had CM/SW come talk, will have home health aide, more help at home. D/c home.   EKG Interpretation None          Duffy Bruce, MD 05/14/18 1433

## 2018-05-14 NOTE — ED Triage Notes (Signed)
Pt reports mulitple falls on her right stump, states she has fallen multiple times recently. Denies hitting her head during the fall.

## 2018-05-14 NOTE — Telephone Encounter (Signed)
This is Will, OT with [facility]. I'm calling to get verbal authorization on [name]. We are requesting occupational therapy 1 time a week for 3 weeks. My phone number is [phone]. Thank you.

## 2018-05-14 NOTE — Discharge Instructions (Signed)
I am working with case management to help increase your home health resources during this difficult transition.  Your x-ray shows no evidence of fracture and stump appears to be healing well.  Continue with your home pain medication as directed by Dr. Sharol Given.  Return to the emergency department for worsening pain, redness, swelling, fevers or new injury.

## 2018-05-14 NOTE — ED Provider Notes (Signed)
[facility] EMERGENCY DEPARTMENT Provider Note   CSN: [id number] Arrival date & time: 05/14/18  5784     History   Chief Complaint Chief Complaint  Patient presents with  . Leg Pain    HPI [name] is a 52 y.o. female.  [name] is a 52 y.o. Female with a history of diabetes, with recent right BKA, hyperlipidemia, GERD, stroke, bipolar disorder, depression, and anxiety, who presents to the emergency department for evaluation of pain in her right stump after multiple falls.  Patient reports she recently had a below the knee amputation with Dr. Sharol Given, patient reports before with for this she was used to being very independent and is still adjusting to not having the lower portion of her right leg.  She reports she frequently gets up to do something, forgetting that she needs assistance and falls, she reports she is fallen multiple times on her stump and has been having pain there.  She denies any head trauma, loss of consciousness, neck or back pain.  Patient was recently admitted for altered mental status and reports she had multiple falls while she was hospitalized as well, she reports pain and wants to make sure it is not broken.  She denies any redness or swelling of the stump, no fevers or chills.     Past Medical History:  Diagnosis Date  . Anxiety   . Arthritis   . Bipolar disorder ([place])   . Broken jaw ([place])   . Broken wrist   . Chronic back pain   . Claudication ([place])    a. 12/2013 ABI's: R 0.97, L 0.94.  [name] COPD (chronic obstructive pulmonary disease) ([place])   . Depression   . Diabetic Charcot's foot ([place])   . Diabetic foot ulcer (H[facility])    chronic left foot ulcer , great toe/  hx recurrent foot ulcer bilaterally  . DJD (degenerative joint disease)   . Edema of both lower extremities   . Episode of memory loss   . GERD (gastroesophageal reflux disease)   . Headache(784.0)   . Hiatal hernia   . History of colon cancer, stage I dx  02-26-2015--- oncologist-- dr [name]--- per last note no recurrence   05-16-2015 s/p  Laparoscopic sigmoid colectomy w/ node bx's (negative nodes per path)  Stage I (pT1,N0,M0) Grade 2  . 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   per MRI multiple small strokes post cardiac cath. --  residual mild memory loss  . History of methicillin resistant staphylococcus aureus (MRSA)   . Hypercholesterolemia   . Insomnia   . Insulin dependent type 2 diabetes mellitus, uncontrolled ([place]) dx 2004   endocrinologist-  dr Dwyane Dee-- last A1c 8.8 in Aug2018:  pt is noncompliant w/ diet, states does note eat breakfast , her first main meal in afternoon  . Neurogenic bladder   . Neuropathy, diabetic ([place])    hands and feet  . OSA (obstructive sleep apnea)    cpap intolerant  . Personality disorder ([place])   . Restless leg syndrome   . SOB (shortness of breath) on exertion   . Stroke ([place])   . SUI (stress urinary incontinence, female) S/P SLING 12-29-2011    Patient Active Problem List   Diagnosis Date Noted  . Acute metabolic encephalopathy 69/62/9528  . Toxic encephalopathy 05/09/2018  . GERD (gastroesophageal reflux disease) 05/09/2018  . Tobacco abuse 05/09/2018  . Polypharmacy 05/09/2018  . Restless leg syndrome 04/27/2018  .  Depression with anxiety 04/27/2018  . Hyponatremia 04/26/2018  . Noncompliance with dietary restriction 04/26/2018  . Postoperative cellulitis of surgical wound 04/26/2018  . At high risk for falls 04/24/2018  . Noncompliance with safety precautions 04/24/2018  . 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 PAD (peripheral artery disease) ([place])   . Poorly controlled type 2 diabetes mellitus ([place])   . Cutaneous abscess of right foot   . Subacute osteomyelitis, right ankle and foot ([place])   . Major depressive disorder, recurrent episode ([place]) 04/11/2018  . Diabetic ulcer of right midfoot associated with type 2 diabetes mellitus, with necrosis of bone  ([place])   . Abscess of ankle   . Infectious synovitis   . Diabetic foot infection ([place])   . Sepsis ([place]) 04/09/2018  . Cancer of sigmoid colon ([place]) 03/09/2015  . OSA (obstructive sleep apnea) 07/04/2014  . Migraine without aura, with intractable migraine, so stated, without mention of status migrainosus 03/26/2014  . Peripheral neuropathy 03/03/2014  . Abnormal stress test 02/12/2014  . CVA (cerebral infarction) 02/12/2014  . Chest pain   . Abnormal heart rhythm   . Edema   . Hypertension   . Hyperlipemia   . Hypercholesterolemia   . Other and unspecified hyperlipidemia 11/14/2013  . Type II diabetes mellitus, uncontrolled ([place]) 07/22/2013  . Essential hypertension 07/22/2013  . Diabetic neuropathy with neurologic complication ([place]) 40/98/1191  . Diabetic polyneuropathy ([place]) 07/22/2013    Past Surgical History:  Procedure Laterality Date  . ABDOMINAL HYSTERECTOMY    . AMPUTATION Right 04/13/2018   Procedure: RIGHT BELOW KNEE AMPUTATION;  Surgeon: Newt Minion, MD;  Location: [facility];  Service: Orthopedics;  Laterality: Right;  . ANTERIOR CERVICAL DECOMP/DISCECTOMY FUSION  2000   C5 - 7  . CARDIAC CATHETERIZATION  05-22-2008   DR SKAINS   NO SIGNIFECANT CAD/ NORMAL LV/  EF 65%/  NO WALL MOTION ABNORMALITIES  . CARPAL TUNNEL RELEASE Right 04-25-2013  . [place]; 1992  . COLONOSCOPY    . CYSTO N/A 04/30/2013   Procedure: CYSTOSCOPY;  Surgeon: Reece Packer, MD;  Location: [facility];  Service: Urology;  Laterality: N/A;  . CYSTOSCOPY MACROPLASTIQUE IMPLANT N/A 02/06/2018   Procedure: CYSTOSCOPY MACROPLASTIQUE IMPLANT;  Surgeon: Bjorn Loser, MD;  Location[facility];  Service: Urology;  Laterality: N/A;  . CYSTOSCOPY WITH INJECTION  05/04/2012   Procedure: CYSTOSCOPY WITH INJECTION;  Surgeon: Reece Packer, MD;  Location: [facility];  Service: Urology;  Laterality: N/A;  MACROPLASTIQUE INJECTION  . CYSTOSCOPY WITH INJECTION   08/28/2012   Procedure: CYSTOSCOPY WITH INJECTION;  Surgeon: Reece Packer, MD;  Location: [facility];  Service: Urology;  Laterality: N/A;  cysto and macroplastique   . FOOT SURGERY Bilateral   . HERNIA REPAIR  ?1996   "stomach"  . INCISION AND DRAINAGE OF WOUND Right 04/11/2018   Procedure: IRRIGATION AND DEBRIDEMENT WOUND right foot and right ankle;  Surgeon: Evelina Bucy, DPM;  Location: [facility];  Service: Podiatry;  Laterality: Right;  . KNEE ARTHROSCOPY W/ ALLOGRAFT IMPANT Left    graft x 2  . KNEE SURGERY     TOTAL 8 SURG'S  . LAPAROSCOPIC SIGMOID COLECTOMY N/A 04/22/2015   Procedure: LAPAROSCOPIC HAND ASSISTED SIGMOID COLECTOMY;  Surgeon: Erroll Luna, MD;  Location: [facility];  Service: General;  Laterality: N/A;  . LEFT HEART CATHETERIZATION WITH CORONARY ANGIOGRAM N/A 02/12/2014   Procedure: LEFT HEART CATHETERIZATION WITH CORONARY ANGIOGRAM;  Surgeon: Elta Guadeloupe  Marlou Porch, MD;  Location: [facility];  Service: Cardiovascular;  Laterality: N/A;   No angiographically significant CAD; normal LVSF, LVEDP 28mmHg,  EF 55% (new finding ef 30% myoview 01-08-2014)  . LUMBAR FUSION    . MANDIBLE FRACTURE SURGERY    . MULTIPLE LAPAROSCOPIES FOR ENDOMETRIOSIS    . PUBOVAGINAL SLING  12/29/2011   Procedure: Gaynelle Arabian;  Surgeon: Reece Packer, MD;  Location: [facility];  Service: Urology;  Laterality: N/A;  cysto and sparc sling   . PUBOVAGINAL SLING N/A 04/30/2013   Procedure: REMOVAL OF VAGINAL MESH;  Surgeon: Reece Packer, MD;  [facility];  Service: Urology;  Laterality: N/A;  . RECONSTURCTION OF CONGENITAL UTERUS ANOMALY  1983  . REPEAT RECONSTRUCTION ACL LEFT KNEE/ SCREWS REMOVED  03-28-2000   CADAVER GRAFT  . TOTAL ABDOMINAL HYSTERECTOMY W/ BILATERAL SALPINGOOPHORECTOMY  1997  . TRANSTHORACIC ECHOCARDIOGRAM  02/13/2014   ef 45%, hypokinesis base inferior and base inferolateral walls  . UPPER GI ENDOSCOPY    . WOUND DEBRIDEMENT Right  09/05/2016   Procedure: DEBRIDEMENT WOUND WITH GRAFT RIGHT FOOT;  Surgeon: Landis Martins, DPM;  Location: [facility];  Service: Podiatry;  Laterality: Right;  . WRIST FRACTURE SURGERY       OB History   None      Home Medications    Prior to Admission medications   Medication Sig Start Date End Date Taking? Authorizing Provider  acetaminophen (TYLENOL) 325 MG tablet Take 1-2 tablets (325-650 mg total) by mouth every 4 (four) hours as needed for mild pain. 04/23/18   Love, Ivan Anchors, PA-C  bisacodyl (DULCOLAX) 5 MG EC tablet Take 1 tablet (5 mg total) by mouth daily as needed for moderate constipation. 04/16/18   Elgergawy, Silver Huguenin, MD  clonazePAM (KLONOPIN) 0.5 MG tablet Take 1 tablet (0.5 mg total) by mouth daily as needed (anxiety). Do not take at night time. 05/10/18   Mariel Aloe, MD  docusate sodium (COLACE) 100 MG capsule Take 1 capsule (100 mg total) by mouth 2 (two) times daily. Patient not taking: Reported on 05/09/2018 04/16/18   Elgergawy, Silver Huguenin, MD  empagliflozin (JARDIANCE) 25 MG TABS tablet Take 25 mg by mouth daily.    [provider]  gabapentin (NEURONTIN) 600 MG tablet Take 1 tablet (600 mg total) by mouth 2 (two) times daily. Take in the morning and afternoon. Do not take at night time. 05/10/18   Mariel Aloe, MD  glucose blood (FREESTYLE LITE) test strip Use to check blood sugars up to 4 times daily. Dx Code E11.65 03/16/18   Elayne Snare, MD  HETLIOZ 20 MG CAPS Take 20 mg by mouth at bedtime. 05/02/18   [provider]  INGREZZA 80 MG CAPS Take 80 mg by mouth at bedtime. 05/01/18   [provider]  insulin aspart (NOVOLOG) 100 UNIT/ML injection Inject 10-35 Units into the skin 3 (three) times daily with meals. Take 10 units before your main meal. If blood sugars are consistently over 250 after the meal start taking 14 units before your main meal. 35 UNITS AT SUPPER    [provider]  Insulin Glargine (BASAGLAR KWIKPEN) 100 UNIT/ML SOPN  Inject 36 Units into the skin at bedtime.    [provider]  lidocaine (LIDODERM) 5 % Place 1 patch onto the skin daily as needed (pain). Remove & Discard patch within 12 hours or as directed by MD 02/27/18   Landis Martins, DPM  nicotine (NICODERM CQ -  DOSED IN MG/24 HOURS) 21 mg/24hr patch Place 1 patch (21 mg total) onto the skin daily. Patient not taking: Reported on 05/09/2018 04/17/18   Elgergawy, Silver Huguenin, MD  ondansetron (ZOFRAN) 4 MG tablet Take 1 tablet (4 mg total) by mouth every 6 (six) hours as needed for nausea. 04/16/18   Elgergawy, Silver Huguenin, MD  oxyCODONE-acetaminophen (PERCOCET) 10-325 MG tablet Take 1 tablet by mouth every 8 (eight) hours as needed for pain. 05/03/18   Newt Minion, MD  pantoprazole (PROTONIX) 40 MG tablet Take 1 tablet (40 mg total) by mouth daily. 04/27/18   Love, Ivan Anchors, PA-C  QUEtiapine (SEROQUEL) 300 MG tablet Take 1 tablet (300 mg total) by mouth at bedtime. 05/10/18   Mariel Aloe, MD  VRAYLAR 4.5 MG CAPS Take 1 capsule by mouth at bedtime. 05/04/18   [provider]    Family History Family History  Problem Relation Age of Onset  . Hypertension Mother   . Diabetes Mother   . Cancer - Other Mother        lymphoma   . Cancer - Other Father        lung, bladder cancer   . Heart attack Father   . Cancer - Other Brother        bladder cancer     Social History Social History   Tobacco Use  . Smoking status: Current Every Day Smoker    Packs/day: 4.00    Years: 45.00    Pack years: 180.00    Types: Cigarettes  . Smokeless tobacco: Never Used  . Tobacco comment: per pt started smoking  age 41  Substance Use Topics  . Alcohol use: No    Alcohol/week: 0.0 oz  . Drug use: No     Allergies   Latex; Sweetening enhancer [flavoring agent]; Aspartame and phenylalanine; Ibuprofen; Trazodone and nefazodone; and Triazolam   Review of Systems Review of Systems  Constitutional: Negative for chills and fever.  Respiratory:  Negative for shortness of breath.   Cardiovascular: Negative for chest pain and leg swelling.  Gastrointestinal: Negative for abdominal pain, nausea and vomiting.  Musculoskeletal: Positive for arthralgias. Negative for joint swelling.  Skin: Negative for color change, rash and wound.  Neurological: Negative for weakness and numbness.  All other systems reviewed and are negative.    Physical Exam Updated Vital Signs BP 121/85 (BP Location: Right Arm)   Pulse 63   Temp 97.9 F (36.6 C) (Oral)   Resp 18   SpO2 100%   Physical Exam  Constitutional: She is oriented to person, place, and time. She appears well-developed and well-nourished. No distress.  HENT:  Head: Normocephalic and atraumatic.  Eyes: Right eye exhibits no discharge. Left eye exhibits no discharge.  Neck: Neck supple.  Cardiovascular: Normal rate, regular rhythm, normal heart sounds and intact distal pulses.  Pulmonary/Chest: Effort normal and breath sounds normal. No stridor. No respiratory distress. She has no wheezes. She has no rales.  Respirations equal and unlabored, patient able to speak in full sentences, lungs clear to auscultation bilaterally  Abdominal: Soft. Bowel sounds are normal. She exhibits no distension and no mass. There is no tenderness. There is no guarding.  Musculoskeletal:  Right leg is warm and well-perfused.  Right below the knee amputation, stump appears to be healing well, no signs of dehiscence or bleeding, no erythema or drainage over incision, no swelling, there is mild tenderness without palpable deformity.  Right knee with normal flexion and extension, sensation  intact, 2+ popliteal pulse  Neurological: She is alert and oriented to person, place, and time. Coordination normal.  Skin: Skin is warm and dry. Capillary refill takes less than 2 seconds. She is not diaphoretic.  Psychiatric: She has a normal mood and affect. Her behavior is normal.  Nursing note and vitals reviewed.    ED  Treatments / Results  Labs (all labs ordered are listed, but only abnormal results are displayed) Labs Reviewed - No data to display  EKG None  Radiology Dg Knee Complete 4 Views Right  Result Date: 05/14/2018 CLINICAL DATA:  Right knee pain due to recent fall. Initial encounter. EXAM: RIGHT KNEE - COMPLETE 4+ VIEW COMPARISON:  None. FINDINGS: No evidence of fracture, dislocation, or joint effusion. No evidence of arthropathy or other focal bone abnormality. Soft tissues are unremarkable. IMPRESSION: Negative exam. Electronically Signed   By: Inge Rise M.D.   On: 05/14/2018 09:43    Procedures Procedures (including critical care time)  Medications Ordered in ED Medications  oxyCODONE-acetaminophen (PERCOCET/ROXICET) 5-325 MG per tablet 2 tablet (2 tablets Oral Given 05/14/18 1424)     Initial Impression / Assessment and Plan / ED Course  I have reviewed the triage vital signs and the nursing notes.  Pertinent labs & imaging results that were available during my care of the patient were reviewed by me and considered in my medical decision making (see chart for details).  Patient presents to the ED for evaluation of right knee pain after falls at home.  Patient had recent right BKA, and is still adjusting to her new situation.  She has PT and OT at home.  Reports she feels like she needs some more help, she frequently forgets that she needs help doing things now and stands up too quickly loses her balance and falls.  She reports pain to her right stump after falls, x-ray shows no evidence of fracture injury, exam shows no signs of infection.  Will give dose of patient's home pain medication as well she has been here she has not had any.  Consulted case management, who plan to contact her home health agency to increase resources to involve a nursing aide as well as a Education officer, museum.  Patient was initially mildly tachycardic I feel this is likely secondary to pain and it has resolved  spontaneously.  At this time patient is stable for discharge home and in no acute distress.  Return precautions discussed.  Patient to follow-up with her primary care doctor and Dr. Sharol Given with orthopedics.  Patient expresses understanding and is in agreement with plan.  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

## 2018-05-14 NOTE — Discharge Planning (Signed)
Pt currently active with [facility] for PT/OT services.  Resumption of care requested. Blanchard Mane of [facility] notified.  No DME needs identified at this time.

## 2018-05-15 ENCOUNTER — Ambulatory Visit: Payer: Medicare Other | Admitting: Sports Medicine

## 2018-05-15 NOTE — Telephone Encounter (Signed)
Yes that's correct. OT orders should come from her Surgeon who did her BKA -Dr. Chauncey Cruel

## 2018-05-15 NOTE — Telephone Encounter (Signed)
I informed Will, OT the OT orders should come from the surgeon.

## 2018-05-17 ENCOUNTER — Ambulatory Visit: Payer: Self-pay | Admitting: Endocrinology

## 2018-05-22 ENCOUNTER — Telehealth (INDEPENDENT_AMBULATORY_CARE_PROVIDER_SITE_OTHER): Payer: Self-pay

## 2018-05-22 ENCOUNTER — Emergency Department (HOSPITAL_COMMUNITY): Payer: Medicare Other

## 2018-05-22 ENCOUNTER — Inpatient Hospital Stay (HOSPITAL_COMMUNITY)
Admission: EM | Admit: 2018-05-22 | Discharge: 2018-05-25 | DRG: 689 | Disposition: A | Discharge status: Home / Self Care | Payer: Medicare Other | Attending: Internal Medicine | Admitting: Internal Medicine

## 2018-05-22 DIAGNOSIS — G9341 Metabolic encephalopathy: Secondary | ICD-10-CM | POA: Diagnosis present

## 2018-05-22 DIAGNOSIS — Z89511 Acquired absence of right leg below knee: Secondary | ICD-10-CM

## 2018-05-22 DIAGNOSIS — F609 Personality disorder, unspecified: Secondary | ICD-10-CM | POA: Diagnosis present

## 2018-05-22 DIAGNOSIS — Y835 Amputation of limb(s) as the cause of abnormal reaction of the patient, or of later complication, without mention of misadventure at the time of the procedure: Secondary | ICD-10-CM | POA: Diagnosis present

## 2018-05-22 DIAGNOSIS — Z720 Tobacco use: Secondary | ICD-10-CM | POA: Diagnosis present

## 2018-05-22 DIAGNOSIS — G4733 Obstructive sleep apnea (adult) (pediatric): Secondary | ICD-10-CM | POA: Diagnosis present

## 2018-05-22 DIAGNOSIS — E7800 Pure hypercholesterolemia, unspecified: Secondary | ICD-10-CM | POA: Diagnosis present

## 2018-05-22 DIAGNOSIS — K219 Gastro-esophageal reflux disease without esophagitis: Secondary | ICD-10-CM | POA: Diagnosis present

## 2018-05-22 DIAGNOSIS — Z8249 Family history of ischemic heart disease and other diseases of the circulatory system: Secondary | ICD-10-CM

## 2018-05-22 DIAGNOSIS — T8781 Dehiscence of amputation stump: Secondary | ICD-10-CM | POA: Diagnosis present

## 2018-05-22 DIAGNOSIS — Z90722 Acquired absence of ovaries, bilateral: Secondary | ICD-10-CM

## 2018-05-22 DIAGNOSIS — Z833 Family history of diabetes mellitus: Secondary | ICD-10-CM

## 2018-05-22 DIAGNOSIS — G2581 Restless legs syndrome: Secondary | ICD-10-CM | POA: Diagnosis present

## 2018-05-22 DIAGNOSIS — F17210 Nicotine dependence, cigarettes, uncomplicated: Secondary | ICD-10-CM | POA: Diagnosis present

## 2018-05-22 DIAGNOSIS — N390 Urinary tract infection, site not specified: Secondary | ICD-10-CM | POA: Diagnosis not present

## 2018-05-22 DIAGNOSIS — E1165 Type 2 diabetes mellitus with hyperglycemia: Secondary | ICD-10-CM | POA: Diagnosis present

## 2018-05-22 DIAGNOSIS — E1151 Type 2 diabetes mellitus with diabetic peripheral angiopathy without gangrene: Secondary | ICD-10-CM | POA: Diagnosis present

## 2018-05-22 DIAGNOSIS — Z886 Allergy status to analgesic agent status: Secondary | ICD-10-CM

## 2018-05-22 DIAGNOSIS — E11610 Type 2 diabetes mellitus with diabetic neuropathic arthropathy: Secondary | ICD-10-CM | POA: Diagnosis present

## 2018-05-22 DIAGNOSIS — N319 Neuromuscular dysfunction of bladder, unspecified: Secondary | ICD-10-CM | POA: Diagnosis present

## 2018-05-22 DIAGNOSIS — Z638 Other specified problems related to primary support group: Secondary | ICD-10-CM

## 2018-05-22 DIAGNOSIS — E11621 Type 2 diabetes mellitus with foot ulcer: Secondary | ICD-10-CM | POA: Diagnosis present

## 2018-05-22 DIAGNOSIS — Z79899 Other long term (current) drug therapy: Secondary | ICD-10-CM

## 2018-05-22 DIAGNOSIS — Z85038 Personal history of other malignant neoplasm of large intestine: Secondary | ICD-10-CM

## 2018-05-22 DIAGNOSIS — I6930 Unspecified sequelae of cerebral infarction: Secondary | ICD-10-CM

## 2018-05-22 DIAGNOSIS — Z888 Allergy status to other drugs, medicaments and biological substances status: Secondary | ICD-10-CM

## 2018-05-22 DIAGNOSIS — Z9102 Food additives allergy status: Secondary | ICD-10-CM

## 2018-05-22 DIAGNOSIS — R4182 Altered mental status, unspecified: Secondary | ICD-10-CM | POA: Diagnosis not present

## 2018-05-22 DIAGNOSIS — Z90710 Acquired absence of both cervix and uterus: Secondary | ICD-10-CM

## 2018-05-22 DIAGNOSIS — L03115 Cellulitis of right lower limb: Secondary | ICD-10-CM | POA: Diagnosis present

## 2018-05-22 DIAGNOSIS — E785 Hyperlipidemia, unspecified: Secondary | ICD-10-CM | POA: Diagnosis present

## 2018-05-22 DIAGNOSIS — Z9111 Patient's noncompliance with dietary regimen: Secondary | ICD-10-CM

## 2018-05-22 DIAGNOSIS — F319 Bipolar disorder, unspecified: Secondary | ICD-10-CM | POA: Diagnosis present

## 2018-05-22 DIAGNOSIS — R441 Visual hallucinations: Secondary | ICD-10-CM | POA: Diagnosis present

## 2018-05-22 DIAGNOSIS — G8929 Other chronic pain: Secondary | ICD-10-CM | POA: Diagnosis present

## 2018-05-22 DIAGNOSIS — F419 Anxiety disorder, unspecified: Secondary | ICD-10-CM | POA: Diagnosis present

## 2018-05-22 DIAGNOSIS — Z794 Long term (current) use of insulin: Secondary | ICD-10-CM

## 2018-05-22 DIAGNOSIS — E1140 Type 2 diabetes mellitus with diabetic neuropathy, unspecified: Secondary | ICD-10-CM | POA: Diagnosis present

## 2018-05-22 DIAGNOSIS — L97529 Non-pressure chronic ulcer of other part of left foot with unspecified severity: Secondary | ICD-10-CM | POA: Diagnosis present

## 2018-05-22 DIAGNOSIS — Y92009 Unspecified place in unspecified non-institutional (private) residence as the place of occurrence of the external cause: Secondary | ICD-10-CM

## 2018-05-22 DIAGNOSIS — J449 Chronic obstructive pulmonary disease, unspecified: Secondary | ICD-10-CM | POA: Diagnosis present

## 2018-05-22 DIAGNOSIS — Z9079 Acquired absence of other genital organ(s): Secondary | ICD-10-CM

## 2018-05-22 DIAGNOSIS — M1990 Unspecified osteoarthritis, unspecified site: Secondary | ICD-10-CM | POA: Diagnosis present

## 2018-05-22 DIAGNOSIS — D72829 Elevated white blood cell count, unspecified: Secondary | ICD-10-CM | POA: Diagnosis present

## 2018-05-22 DIAGNOSIS — Z91040 Latex allergy status: Secondary | ICD-10-CM

## 2018-05-22 DIAGNOSIS — F4310 Post-traumatic stress disorder, unspecified: Secondary | ICD-10-CM | POA: Diagnosis present

## 2018-05-22 DIAGNOSIS — Z8614 Personal history of Methicillin resistant Staphylococcus aureus infection: Secondary | ICD-10-CM

## 2018-05-22 DIAGNOSIS — I10 Essential (primary) hypertension: Secondary | ICD-10-CM | POA: Diagnosis present

## 2018-05-22 DIAGNOSIS — W06XXXA Fall from bed, initial encounter: Secondary | ICD-10-CM | POA: Diagnosis present

## 2018-05-22 LAB — COMPREHENSIVE METABOLIC PANEL
ALT: 16 U/L (ref 14–54)
AST: 19 U/L (ref 15–41)
Albumin: 3.8 g/dL (ref 3.5–5.0)
Alkaline Phosphatase: 107 U/L (ref 38–126)
Anion gap: 13 (ref 5–15)
BUN: 16 mg/dL (ref 6–20)
CO2: 27 mmol/L (ref 22–32)
Calcium: 9.6 mg/dL (ref 8.9–10.3)
Chloride: 98 mmol/L — ABNORMAL LOW (ref 101–111)
Creatinine, Ser: 1.02 mg/dL — ABNORMAL HIGH (ref 0.44–1.00)
GFR calc Af Amer: >60 mL/min (ref >60)
GFR calc non Af Amer: >60 mL/min (ref >60)
Glucose, Bld: 273 mg/dL — ABNORMAL HIGH (ref 65–99)
Potassium: 4 mmol/L (ref 3.5–5.1)
Sodium: 138 mmol/L (ref 135–145)
Total Bilirubin: 0.5 mg/dL (ref 0.3–1.2)
Total Protein: 7.5 g/dL (ref 6.5–8.1)

## 2018-05-22 LAB — CBC WITH DIFFERENTIAL/PLATELET
Abs Immature Granulocytes: 0.1 10*3/uL (ref 0.0–0.1)
Basophils Absolute: 0.1 10*3/uL (ref 0.0–0.1)
Basophils Relative: 1 %
Eosinophils Absolute: 0.2 10*3/uL (ref 0.0–0.7)
Eosinophils Relative: 1 %
HCT: 41.5 % (ref 36.0–46.0)
Hemoglobin: 13.8 g/dL (ref 12.0–15.0)
Immature Granulocytes: 0 %
Lymphocytes Relative: 20 %
Lymphs Abs: 2.5 10*3/uL (ref 0.7–4.0)
MCH: 30.3 pg (ref 26.0–34.0)
MCHC: 33.3 g/dL (ref 30.0–36.0)
MCV: 91.2 fL (ref 78.0–100.0)
Monocytes Absolute: 0.6 10*3/uL (ref 0.1–1.0)
Monocytes Relative: 5 %
Neutro Abs: 9.1 10*3/uL — ABNORMAL HIGH (ref 1.7–7.7)
Neutrophils Relative %: 73 %
Platelets: 223 10*3/uL (ref 150–400)
RBC: 4.55 MIL/uL (ref 3.87–5.11)
RDW: 13.8 % (ref 11.5–15.5)
WBC: 12.5 10*3/uL — ABNORMAL HIGH (ref 4.0–10.5)

## 2018-05-22 LAB — I-STAT CG4 LACTIC ACID, ED: Lactic Acid, Venous: 1.21 mmol/L (ref 0.5–1.9)

## 2018-05-22 LAB — AMMONIA: Ammonia: 11 umol/L (ref 9–35)

## 2018-05-22 LAB — ETHANOL: Alcohol, Ethyl (B): <10 mg/dL (ref <10)

## 2018-05-22 NOTE — ED Notes (Signed)
Pt found attempting to exit the foot of the bed. Pt instructed to remain in bed and not to climb from bed. Pt acknowledged RN and sat back in bed.

## 2018-05-22 NOTE — ED Triage Notes (Signed)
Pt arrived via [facility] from home. Pt husband states she had a change in mental status today and fell yesterday. Pt had a Right BKA 2 weeks ago and "has been attempting to walk on her missing leg, ", according to EMS. Pt has hx of DM.

## 2018-05-22 NOTE — ED Notes (Signed)
Pt stated she could provide urine, RN took pt to restroom in wheelchair. Pt spent 10 mins in restroom without being able to provide specimen. RN Pt returned to room. EDP

## 2018-05-22 NOTE — Telephone Encounter (Signed)
[name] Clinical social worker at [facility] [place] and her co-worker [name], a treatment team that works with patient called stating that patient is needing home health due to falling a lot for the last couple of days.  Gwynn stated that she spoke with patient's husband [name] about scheduling patient an appt., but he stated patient was upset and had some falls.  Gwynn's cb# is [phone].  Please advise.   Thank You.

## 2018-05-22 NOTE — ED Notes (Signed)
[phone]. Husband.

## 2018-05-23 DIAGNOSIS — Z720 Tobacco use: Secondary | ICD-10-CM

## 2018-05-23 DIAGNOSIS — E1165 Type 2 diabetes mellitus with hyperglycemia: Secondary | ICD-10-CM | POA: Diagnosis not present

## 2018-05-23 DIAGNOSIS — K219 Gastro-esophageal reflux disease without esophagitis: Secondary | ICD-10-CM

## 2018-05-23 DIAGNOSIS — G9341 Metabolic encephalopathy: Secondary | ICD-10-CM | POA: Diagnosis not present

## 2018-05-23 DIAGNOSIS — N390 Urinary tract infection, site not specified: Secondary | ICD-10-CM | POA: Diagnosis present

## 2018-05-23 DIAGNOSIS — F418 Other specified anxiety disorders: Secondary | ICD-10-CM

## 2018-05-23 LAB — URINALYSIS, ROUTINE W REFLEX MICROSCOPIC
Bilirubin Urine: NEGATIVE
KETONES UR: 20 mg/dL — AB
NITRITE: NEGATIVE
PH: 5 (ref 5.0–8.0)
PROTEIN: 30 mg/dL — AB
Specific Gravity, Urine: 1.029 (ref 1.005–1.030)

## 2018-05-23 LAB — CBG MONITORING, ED
Glucose-Capillary: 286 mg/dL — ABNORMAL HIGH (ref 65–99)
Glucose-Capillary: 282 mg/dL — ABNORMAL HIGH (ref 65–99)

## 2018-05-23 LAB — RAPID URINE DRUG SCREEN, HOSP PERFORMED
Amphetamines: NOT DETECTED
BARBITURATES: NOT DETECTED
Benzodiazepines: POSITIVE — AB
COCAINE: NOT DETECTED
Opiates: NOT DETECTED
Tetrahydrocannabinol: NOT DETECTED

## 2018-05-23 LAB — BASIC METABOLIC PANEL
Anion gap: 14 (ref 5–15)
BUN: 16 mg/dL (ref 6–20)
CO2: 27 mmol/L (ref 22–32)
CREATININE: 0.98 mg/dL (ref 0.44–1.00)
Calcium: 9.3 mg/dL (ref 8.9–10.3)
Chloride: 95 mmol/L — ABNORMAL LOW (ref 101–111)
GFR calc non Af Amer: >60 mL/min (ref >60)
Glucose, Bld: 260 mg/dL — ABNORMAL HIGH (ref 65–99)
Potassium: 3.8 mmol/L (ref 3.5–5.1)
Sodium: 136 mmol/L (ref 135–145)

## 2018-05-23 LAB — CBC
HCT: 40.1 % (ref 36.0–46.0)
Hemoglobin: 13.1 g/dL (ref 12.0–15.0)
MCH: 30 pg (ref 26.0–34.0)
MCHC: 32.7 g/dL (ref 30.0–36.0)
MCV: 91.8 fL (ref 78.0–100.0)
PLATELETS: 230 10*3/uL (ref 150–400)
RBC: 4.37 MIL/uL (ref 3.87–5.11)
RDW: 13.6 % (ref 11.5–15.5)
WBC: 12 10*3/uL — AB (ref 4.0–10.5)

## 2018-05-23 LAB — GLUCOSE, CAPILLARY: Glucose-Capillary: 199 mg/dL — ABNORMAL HIGH (ref 65–99)

## 2018-05-23 MED ORDER — PANTOPRAZOLE SODIUM 40 MG PO TBEC
40.0000 mg | DELAYED_RELEASE_TABLET | Freq: Every day | ORAL | Status: DC
  Administered 2018-05-23 – 2018-05-25 (×3): 40 mg via ORAL
  Filled 2018-05-23 (×3): qty 1

## 2018-05-23 MED ORDER — NICOTINE 21 MG/24HR TD PT24
21.0000 mg | MEDICATED_PATCH | Freq: Every day | TRANSDERMAL | Status: DC
  Administered 2018-05-23 – 2018-05-25 (×3): 21 mg via TRANSDERMAL
  Filled 2018-05-23 (×3): qty 1

## 2018-05-23 MED ORDER — ONDANSETRON HCL 4 MG PO TABS: 4.0000 mg | ORAL_TABLET | Freq: Four times a day (QID) | ORAL | Status: DC | PRN

## 2018-05-23 MED ORDER — BISACODYL 5 MG PO TBEC: 5.0000 mg | DELAYED_RELEASE_TABLET | Freq: Every day | ORAL | Status: DC | PRN

## 2018-05-23 MED ORDER — LIDOCAINE 5 % EX PTCH: 1.0000 | MEDICATED_PATCH | Freq: Every day | CUTANEOUS | Status: DC | PRN

## 2018-05-23 MED ORDER — INSULIN ASPART 100 UNIT/ML ~~LOC~~ SOLN
0.0000 [IU] | Freq: Three times a day (TID) | SUBCUTANEOUS | Status: DC
  Administered 2018-05-23 (×2): 5 [IU] via SUBCUTANEOUS
  Administered 2018-05-23: 2 [IU] via SUBCUTANEOUS
  Administered 2018-05-24: 5 [IU] via SUBCUTANEOUS
  Administered 2018-05-24 (×2): 7 [IU] via SUBCUTANEOUS
  Administered 2018-05-25: 2 [IU] via SUBCUTANEOUS
  Filled 2018-05-23 (×2): qty 1

## 2018-05-23 MED ORDER — GABAPENTIN 600 MG PO TABS
600.0000 mg | ORAL_TABLET | Freq: Two times a day (BID) | ORAL | Status: DC
  Administered 2018-05-23 – 2018-05-25 (×5): 600 mg via ORAL
  Filled 2018-05-23 (×6): qty 1

## 2018-05-23 MED ORDER — INSULIN GLARGINE 100 UNIT/ML ~~LOC~~ SOLN
24.0000 [IU] | Freq: Every day | SUBCUTANEOUS | Status: DC
  Administered 2018-05-23 – 2018-05-24 (×2): 24 [IU] via SUBCUTANEOUS
  Filled 2018-05-23 (×2): qty 0.24

## 2018-05-23 MED ORDER — SODIUM CHLORIDE 0.9 % IV SOLN
1.0000 g | INTRAVENOUS | Status: DC
  Administered 2018-05-23 – 2018-05-24 (×2): 1 g via INTRAVENOUS
  Filled 2018-05-23 (×4): qty 10

## 2018-05-23 MED ORDER — ENOXAPARIN SODIUM 40 MG/0.4ML ~~LOC~~ SOLN
40.0000 mg | SUBCUTANEOUS | Status: DC
  Administered 2018-05-23 – 2018-05-24 (×2): 40 mg via SUBCUTANEOUS
  Filled 2018-05-23 (×3): qty 0.4

## 2018-05-23 MED ORDER — ACETAMINOPHEN 325 MG PO TABS
650.0000 mg | ORAL_TABLET | ORAL | Status: DC | PRN
  Administered 2018-05-23 – 2018-05-25 (×7): 650 mg via ORAL
  Filled 2018-05-23 (×8): qty 2

## 2018-05-23 MED ORDER — HYDRALAZINE HCL 20 MG/ML IJ SOLN: 5.0000 mg | INTRAMUSCULAR | Status: DC | PRN

## 2018-05-23 MED ORDER — ALBUTEROL SULFATE (2.5 MG/3ML) 0.083% IN NEBU: 2.5000 mg | INHALATION_SOLUTION | RESPIRATORY_TRACT | Status: DC | PRN

## 2018-05-23 MED ORDER — DOCUSATE SODIUM 100 MG PO CAPS
100.0000 mg | ORAL_CAPSULE | Freq: Two times a day (BID) | ORAL | Status: DC
  Administered 2018-05-23 – 2018-05-25 (×5): 100 mg via ORAL
  Filled 2018-05-23 (×5): qty 1

## 2018-05-23 MED ORDER — ONDANSETRON HCL 4 MG/2ML IJ SOLN: 4.0000 mg | Freq: Four times a day (QID) | INTRAMUSCULAR | Status: DC | PRN

## 2018-05-23 NOTE — ED Notes (Signed)
Tele sitter set up, bed alarm on, posey activity apron given to pt. Pts curtain open to room, pt visible from nursing station. Pt has call bell in reach.

## 2018-05-23 NOTE — ED Notes (Signed)
Pt asked RN to go to the bathroom.  RN reminded pt that she had just fallen and needed to stay in bed.  Pt did allow RN to place a bedpan.  She is very disoriented and at times the words she speaks do not make sense.  Unable to hold a conversation.

## 2018-05-23 NOTE — Progress Notes (Signed)
Multiple attempts to get OOB unassisted despite bed alarm, telesitter. Patient with very strong hand grip, redirection not effective,very confused. MD made aware and will refrain from meds that would sedate patient. Will do Air cabin crew .

## 2018-05-23 NOTE — H&P (Signed)
History and Physical    [name] YKZ:[id number] DOB: [birth year] DOA: 05/22/2018  Referring MD/NP/PA:   PCP: Tamsen Roers, MD   Patient coming from:  The patient is coming from home.  At baseline, pt is independent for most of ADL.   Chief Complaint: AMS and fall  HPI: [name] is a 52 y.o. female with medical history significant of hyperlipidemia, diabetes mellitus, COPD, stroke, GERD, depression with anxiety, OSA not on CPAP, tobacco abuse, RLS, stage I colon cancer (s/p of cholectomy), who presents with altered mental status.  Pt was recently hospitalized from 5/15-5/16 due to altered mental status, which was likely due to polypharmacy.  Her sedative medications were on hold, mental status improved at discharge.  Today patient was brought in due to similar presentation.  Patient was reportedly to be confused today with hallucination.  She had fall when she attempted to walk yesterday without significant injury. Pt is s/p of R BKA. When I saw pt in ED, she is confused. She is oriented to place and knows her own name, but not oriented to time and place. she does not have chest pain, shortness of breath.  No active cough, nausea, vomiting, diarrhea notated.  She moves all extremities normally. Pt has multiple sedative medications on board, including Klonopin, gabapentin, Percocet, Seroquel, Hetlioz, Ingrezza and vraylar.   ED Course: pt was found to have WBC 12.5, lactic acid 1.21, creatinine 1.02, alcohol less than 10, ammonia 11, temperature normal, heart rate in 90s, no tachypnea, oxygen saturation 98% on room air, CT head is negative for acute intracranial abnormalities.  Review of Systems: Could not be reviewed accurately due to altered mental status.  Allergy:  Allergies  Allergen Reactions  . Latex Itching, Rash and Other (See Comments)    Pt states she cannot use condoms - cause an infection.  Use of latex on skin is okay.  Tape causes rash  . Sweetening Enhancer  [Flavoring Agent] Nausea And Vomiting and Other (See Comments)    HEADACHES  . Aspartame And Phenylalanine Nausea And Vomiting    HEADACHES  . Ibuprofen Other (See Comments)    HEADACHES  . Trazodone And Nefazodone Other (See Comments)    Hallucinations   . Triazolam Other (See Comments)    HALLUCINATIONS    Past Medical History:  Diagnosis Date  . Anxiety   . Arthritis   . Bipolar disorder ([place])   . Broken jaw ([place])   . Broken wrist   . Chronic back pain   . Claudication ([place])    a. 12/2013 ABI's: R 0.97, L 0.94.  [name] COPD (chronic obstructive pulmonary disease) ([place])   . Depression   . Diabetic Charcot's foot ([place])   . Diabetic foot ulcer (H[facility])    chronic left foot ulcer , great toe/  hx recurrent foot ulcer bilaterally  . DJD (degenerative joint disease)   . Edema of both lower extremities   . Episode of memory loss   . GERD (gastroesophageal reflux disease)   . Headache(784.0)   . Hiatal hernia   . History of colon cancer, stage I dx 02-26-2015--- oncologist-- dr [name]--- per last note no recurrence   05-16-2015 s/p  Laparoscopic sigmoid colectomy w/ node bx's (negative nodes per path)  Stage I (pT1,N0,M0) Grade 2  . 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   per MRI multiple small strokes post cardiac cath. --  residual mild memory loss  . History of methicillin  resistant staphylococcus aureus (MRSA)   . Hypercholesterolemia   . Insomnia   . Insulin dependent type 2 diabetes mellitus, uncontrolled ([place]) dx 2004   endocrinologist-  dr Dwyane Dee-- last A1c 8.8 in Aug2018:  pt is noncompliant w/ diet, states does note eat breakfast , her first main meal in afternoon  . Neurogenic bladder   . Neuropathy, diabetic ([place])    hands and feet  . OSA (obstructive sleep apnea)    cpap intolerant  . Personality disorder ([place])   . Restless leg syndrome   . SOB (shortness of breath) on exertion   . Stroke ([place])   . SUI (stress urinary incontinence,  female) S/P SLING 12-29-2011    Past Surgical History:  Procedure Laterality Date  . ABDOMINAL HYSTERECTOMY    . AMPUTATION Right 04/13/2018   Procedure: RIGHT BELOW KNEE AMPUTATION;  Surgeon: Newt Minion, MD;  Location: [facility];  Service: Orthopedics;  Laterality: Right;  . ANTERIOR CERVICAL DECOMP/DISCECTOMY FUSION  2000   C5 - 7  . CARDIAC CATHETERIZATION  05-22-2008   DR SKAINS   NO SIGNIFECANT CAD/ NORMAL LV/  EF 65%/  NO WALL MOTION ABNORMALITIES  . CARPAL TUNNEL RELEASE Right 04-25-2013  . [place]; 1992  . COLONOSCOPY    . CYSTO N/A 04/30/2013   Procedure: CYSTOSCOPY;  Surgeon: Reece Packer, MD;  Location: [facility];  Service: Urology;  Laterality: N/A;  . CYSTOSCOPY MACROPLASTIQUE IMPLANT N/A 02/06/2018   Procedure: CYSTOSCOPY MACROPLASTIQUE IMPLANT;  Surgeon: Bjorn Loser, MD;  [facility];  Service: Urology;  Laterality: N/A;  . CYSTOSCOPY WITH INJECTION  05/04/2012   Procedure: CYSTOSCOPY WITH INJECTION;  Surgeon: Reece Packer, MD;  Location: [facility];  Service: Urology;  Laterality: N/A;  MACROPLASTIQUE INJECTION  . CYSTOSCOPY WITH INJECTION  08/28/2012   Procedure: CYSTOSCOPY WITH INJECTION;  Surgeon: Reece Packer, MD;  [facility];  Service: Urology;  Laterality: N/A;  cysto and macroplastique   . FOOT SURGERY Bilateral   . HERNIA REPAIR  ?1996   "stomach"  . INCISION AND DRAINAGE OF WOUND Right 04/11/2018   Procedure: IRRIGATION AND DEBRIDEMENT WOUND right foot and right ankle;  Surgeon: Evelina Bucy, DPM;  Location: [facility];  Service: Podiatry;  Laterality: Right;  . KNEE ARTHROSCOPY W/ ALLOGRAFT IMPANT Left    graft x 2  . KNEE SURGERY     TOTAL 8 SURG'S  . LAPAROSCOPIC SIGMOID COLECTOMY N/A 04/22/2015   Procedure: LAPAROSCOPIC HAND ASSISTED SIGMOID COLECTOMY;  Surgeon: Erroll Luna, MD;  Location: [place];  Service: General;  Laterality: N/A;  . LEFT HEART  CATHETERIZATION WITH CORONARY ANGIOGRAM N/A 02/12/2014   Procedure: LEFT HEART CATHETERIZATION WITH CORONARY ANGIOGRAM;  Surgeon: Candee Furbish, MD;  Location: [facility];  Service: Cardiovascular;  Laterality: N/A;   No angiographically significant CAD; normal LVSF, LVEDP 61mmHg,  EF 55% (new finding ef 30% myoview 01-08-2014)  . LUMBAR FUSION    . MANDIBLE FRACTURE SURGERY    . MULTIPLE LAPAROSCOPIES FOR ENDOMETRIOSIS    . PUBOVAGINAL SLING  12/29/2011   Procedure: Gaynelle Arabian;  Surgeon: Reece Packer, MD;  Location[facility];  Service: Urology;  Laterality: N/A;  cysto and sparc sling   . PUBOVAGINAL SLING N/A 04/30/2013   Procedure: REMOVAL OF VAGINAL MESH;  Surgeon: Reece Packer, MD;  Location: [facility];  Service: Urology;  Laterality: N/A;  . RECONSTURCTION OF CONGENITAL UTERUS ANOMALY  1983  .  REPEAT RECONSTRUCTION ACL LEFT KNEE/ SCREWS REMOVED  03-28-2000   CADAVER GRAFT  . TOTAL ABDOMINAL HYSTERECTOMY W/ BILATERAL SALPINGOOPHORECTOMY  1997  . TRANSTHORACIC ECHOCARDIOGRAM  02/13/2014   ef 45%, hypokinesis base inferior and base inferolateral walls  . UPPER GI ENDOSCOPY    . WOUND DEBRIDEMENT Right 09/05/2016   Procedure: DEBRIDEMENT WOUND WITH GRAFT RIGHT FOOT;  Surgeon: Landis Martins, DPM;  Location: [facility];  Service: Podiatry;  Laterality: Right;  . WRIST FRACTURE SURGERY      Social History:  reports that she has been smoking cigarettes.  She has a 180.00 pack-year smoking history. She has never used smokeless tobacco. She reports that she does not drink alcohol or use drugs.  Family History:  Family History  Problem Relation Age of Onset  . Hypertension Mother   . Diabetes Mother   . Cancer - Other Mother        lymphoma   . Cancer - Other Father        lung, bladder cancer   . Heart attack Father   . Cancer - Other Brother        bladder cancer      Prior to Admission medications   Medication Sig Start Date End Date Taking? Authorizing  Provider  oxyCODONE-acetaminophen (PERCOCET) 10-325 MG tablet Take 1 tablet by mouth every 8 (eight) hours as needed for pain. 05/03/18  Yes Newt Minion, MD  acetaminophen (TYLENOL) 325 MG tablet Take 1-2 tablets (325-650 mg total) by mouth every 4 (four) hours as needed for mild pain. 04/23/18   Love, Ivan Anchors, PA-C  bisacodyl (DULCOLAX) 5 MG EC tablet Take 1 tablet (5 mg total) by mouth daily as needed for moderate constipation. 04/16/18   Elgergawy, Silver Huguenin, MD  clonazePAM (KLONOPIN) 0.5 MG tablet Take 1 tablet (0.5 mg total) by mouth daily as needed (anxiety). Do not take at night time. 05/10/18   Mariel Aloe, MD  docusate sodium (COLACE) 100 MG capsule Take 1 capsule (100 mg total) by mouth 2 (two) times daily. 04/16/18   Elgergawy, Silver Huguenin, MD  empagliflozin (JARDIANCE) 25 MG TABS tablet Take 25 mg by mouth daily.    [provider]  gabapentin (NEURONTIN) 600 MG tablet Take 1 tablet (600 mg total) by mouth 2 (two) times daily. Take in the morning and afternoon. Do not take at night time. 05/10/18   Mariel Aloe, MD  glucose blood (FREESTYLE LITE) test strip Use to check blood sugars up to 4 times daily. Dx Code E11.65 03/16/18   Elayne Snare, MD  HETLIOZ 20 MG CAPS Take 20 mg by mouth at bedtime. 05/02/18   [provider]  INGREZZA 80 MG CAPS Take 80 mg by mouth at bedtime. 05/01/18   [provider]  insulin aspart (NOVOLOG) 100 UNIT/ML injection Inject 10-35 Units into the skin 3 (three) times daily with meals. Take 10 units before your main meal. If blood sugars are consistently over 250 after the meal start taking 14 units before your main meal. 35 UNITS AT SUPPER    [provider]  Insulin Glargine (BASAGLAR KWIKPEN) 100 UNIT/ML SOPN Inject 36 Units into the skin at bedtime.    [provider]  lidocaine (LIDODERM) 5 % Place 1 patch onto the skin daily as needed (pain). Remove & Discard patch within 12 hours or as directed by MD 02/27/18   Landis Martins, DPM  nicotine (NICODERM CQ - DOSED IN MG/24 HOURS) 21 mg/24hr patch Place  1 patch (21 mg total) onto the skin daily. 04/17/18   Elgergawy, Silver Huguenin, MD  ondansetron (ZOFRAN) 4 MG tablet Take 1 tablet (4 mg total) by mouth every 6 (six) hours as needed for nausea. 04/16/18   Elgergawy, Silver Huguenin, MD  pantoprazole (PROTONIX) 40 MG tablet Take 1 tablet (40 mg total) by mouth daily. 04/27/18   Love, Ivan Anchors, PA-C  QUEtiapine (SEROQUEL) 300 MG tablet Take 1 tablet (300 mg total) by mouth at bedtime. 05/10/18   Mariel Aloe, MD  VRAYLAR 4.5 MG CAPS Take 1 capsule by mouth at bedtime. 05/04/18   [provider]    Physical Exam: Vitals:   05/23/18 0020 05/23/18 0251 05/23/18 0252 05/23/18 0401  BP: (!) 161/80 (!) 157/70  (!) 145/68  Pulse:   97 99  Resp:    16  Temp:      TempSrc:      SpO2:   97% 100%   General: Not in acute distress HEENT:       Eyes: PERRL, EOMI, no scleral icterus.       ENT: No discharge from the ears and nose, no pharynx injection, no tonsillar enlargement.        Neck: No JVD, no bruit, no mass felt. Heme: No neck lymph node enlargement. Cardiac: S1/S2, RRR, No murmurs, No gallops or rubs. Respiratory: No rales, wheezing, rhonchi or rubs. GI: Soft, nondistended, nontender, no rebound pain, no organomegaly, BS present. GU: No hematuria Ext: No pitting leg edema bilaterally. 2+DP/PT pulse in left leg. S/p of R BKA Musculoskeletal: No joint deformities, No joint redness or warmth, no limitation of ROM in spin. Skin: No rashes.  Neuro:confused, knows her own name and place, not oriented to time and person.  cranial nerves II-XII grossly intact, moves all extremities normally.  Psych: Patient is not psychotic, no suicidal or hemocidal ideation.  Labs on Admission: I have personally reviewed following labs and imaging studies  CBC: Recent Labs  Lab 05/22/18 1945  WBC 12.5*  NEUTROABS 9.1*  HGB 13.8  HCT 41.5  MCV 91.2  PLT 947   Basic Metabolic  Panel: Recent Labs  Lab 05/22/18 1945  NA 138  K 4.0  CL 98*  CO2 27  GLUCOSE 273*  BUN 16  CREATININE 1.02*  CALCIUM 9.6   GFR: CrCl cannot be calculated (Unknown ideal weight.). Liver Function Tests: Recent Labs  Lab 05/22/18 1945  AST 19  ALT 16  ALKPHOS 107  BILITOT 0.5  PROT 7.5  ALBUMIN 3.8   No results for input(s): LIPASE, AMYLASE in the last 168 hours. Recent Labs  Lab 05/22/18 2139  AMMONIA 11   Coagulation Profile: No results for input(s): INR, PROTIME in the last 168 hours. Cardiac Enzymes: No results for input(s): CKTOTAL, CKMB, CKMBINDEX, TROPONINI in the last 168 hours. BNP (last 3 results) No results for input(s): PROBNP in the last 8760 hours. HbA1C: No results for input(s): HGBA1C in the last 72 hours. CBG: No results for input(s): GLUCAP in the last 168 hours. Lipid Profile: No results for input(s): CHOL, HDL, LDLCALC, TRIG, CHOLHDL, LDLDIRECT in the last 72 hours. Thyroid Function Tests: No results for input(s): TSH, T4TOTAL, FREET4, T3FREE, THYROIDAB in the last 72 hours. Anemia Panel: No results for input(s): VITAMINB12, FOLATE, FERRITIN, TIBC, IRON, RETICCTPCT in the last 72 hours. Urine analysis:    Component Value Date/Time   COLORURINE YELLOW 05/09/2018 1910   APPEARANCEUR CLEAR 05/09/2018 1910   LABSPEC 1.024 05/09/2018 1910  PHURINE 7.0 05/09/2018 1910   GLUCOSEU >=500 (A) 05/09/2018 1910   GLUCOSEU >=1000 (A) 01/07/2015 1410   HGBUR NEGATIVE 05/09/2018 1910   BILIRUBINUR NEGATIVE 05/09/2018 1910   KETONESUR NEGATIVE 05/09/2018 1910   PROTEINUR NEGATIVE 05/09/2018 1910   UROBILINOGEN 0.2 04/23/2015 2355   NITRITE NEGATIVE 05/09/2018 1910   LEUKOCYTESUR NEGATIVE 05/09/2018 1910   Sepsis Labs: @LABRCNTIP (procalcitonin:4,lacticidven:4) )No results found for this or any previous visit (from the past 240 hour(s)).   Radiological Exams on Admission: Dg Chest 2 View  Result Date: 05/22/2018 CLINICAL DATA:  Shortness of  breath, central chest pain EXAM: CHEST - 2 VIEW COMPARISON:  05/09/2018 FINDINGS: Lungs are clear.  No pleural effusion or pneumothorax. The heart is normal in size. Mild degenerative changes of the visualized thoracolumbar spine. Cervical spine fixation hardware. IMPRESSION: Normal chest radiographs. Electronically Signed   By: Julian Hy M.D.   On: 05/22/2018 21:24   Ct Head Wo Contrast  Result Date: 05/22/2018 CLINICAL DATA:  Status post fall. Acute onset of altered mental status. EXAM: CT HEAD WITHOUT CONTRAST TECHNIQUE: Contiguous axial images were obtained from the base of the skull through the vertex without intravenous contrast. COMPARISON:  CT of the head performed 05/09/2018 FINDINGS: Brain: No evidence of acute infarction, hemorrhage, hydrocephalus, extra-axial collection or mass lesion/mass effect. The posterior fossa, including the cerebellum, brainstem and fourth ventricle, is within normal limits. The third and lateral ventricles, and basal ganglia are unremarkable in appearance. The cerebral hemispheres are symmetric in appearance, with normal gray-white differentiation. No mass effect or midline shift is seen. Vascular: No hyperdense vessel or unexpected calcification. Skull: There is no evidence of fracture; visualized osseous structures are unremarkable in appearance. Sinuses/Orbits: The visualized portions of the orbits are within normal limits. The paranasal sinuses and mastoid air cells are well-aerated. Other: No significant soft tissue abnormalities are seen. IMPRESSION: No evidence of traumatic intracranial injury or fracture. Electronically Signed   By: Garald Balding M.D.   On: 05/22/2018 23:30     EKG: Independently reviewed.  Sinus rhythm, QTC 473, nonspecific T wave change.  Assessment/Plan Principal Problem:   Acute metabolic encephalopathy Active Problems:   Type II diabetes mellitus, uncontrolled (HCC)   Leukocytosis   Depression with anxiety   GERD  (gastroesophageal reflux disease)   Tobacco abuse   Acute metabolic encephalopathy: Etiology is not clear.  CT head is negative for acute intracranial abnormalities, potential possibilities polypharmacy. Pt has multiple sedative medications on board, including Klonopin, gabapentin, Percocet, Seroquel, Hetlioz, Ingrezza and vraylar.   -will place on tele bed for obs -hold klonopin, gabapentin, Percocet, Seroquel, Hetlioz, Ingrezza and vraylar.  -Frequent neuro check -sitter at bedside due to agitation  Type II diabetes mellitus, uncontrolled ([place]): Last A1c 9.2 on 03/19/18, poorly controled. Patient is taking NovoLog, Jardiance, glargine insulin at home -will decrease glargine insulin dose from 36 to 24 units daily -SSI  Leukocytosis: WBC 12.5, etiology is not clear.  This seems to be chronic issue.  Patient has leukocytosis with WBC 13.1 on 05/10/2018.  No source of infection identified. -Follow-up urine analysis.  Depression with anxiety: -hold home meds  GERD: -Protonix  Tobacco abuse: -nicotine patch   DVT ppx: SQ Lovenox Code Status: Full code Family Communication: None at bed side.   Disposition Plan:  Anticipate discharge back to previous home environment Consults called:  none Admission status: Obs / tele        Date of Service 05/23/2018    Ivor Costa [facility] Pager  [phone]  If 7PM-7AM, please contact night-coverage [email or web address] Password [facility] 05/23/2018, 4:29 AM

## 2018-05-23 NOTE — ED Notes (Signed)
Pt visible from nursing station, curtain to room open, tele sitter at bedside. Pt resting on bed.

## 2018-05-23 NOTE — Telephone Encounter (Signed)
Pt was admitted to the hospital yesterday with altered mental status. She is a recent BKA and also has had multiple falls if you want to round on her while she is there. social worker at [facility] called yesterday asking for home health to be arranged for the multiple falls the pt has had a home. Can this be ordered for her prior to d/c?

## 2018-05-23 NOTE — ED Provider Notes (Signed)
[facility] DEPARTMENT Provider Note   CSN: [id number] Arrival date & time: 05/22/18  1841     History   Chief Complaint Chief Complaint  Patient presents with  . Altered Mental Status    HPI [name] is a 52 y.o. female.  HPI Patient presents to the emergency department with altered mental status that started late last night and early this morning.  The patient is unable to give any history and the history is obtained from the husband.  The patient has not had any fevers or vomiting per the husband.  Husband states that she was talking about things that were not making sense.  The patient told me upon asking what brought her to the hospital was she is not been feeling well and that she and her husband drove here with bread but then had to go back home and get more bread and bring it back here patient then starts talking about how there is people in the room talking on the phone but there is no one else in the room.   Past Medical History:  Diagnosis Date  . Anxiety   . Arthritis   . Bipolar disorder ([place])   . Broken jaw ([place])   . Broken wrist   . Chronic back pain   . Claudication ([place])    a. 12/2013 ABI's: R 0.97, L 0.94.  [name] COPD (chronic obstructive pulmonary disease) ([place])   . Depression   . Diabetic Charcot's foot ([place])   . Diabetic foot ulcer ([facility])    chronic left foot ulcer , great toe/  hx recurrent foot ulcer bilaterally  . DJD (degenerative joint disease)   . Edema of both lower extremities   . Episode of memory loss   . GERD (gastroesophageal reflux disease)   . Headache(784.0)   . Hiatal hernia   . History of colon cancer, stage I dx 02-26-2015--- oncologist-- dr [name]--- per last note no recurrence   05-16-2015 s/p  Laparoscopic sigmoid colectomy w/ node bx's (negative nodes per path)  Stage I (pT1,N0,M0) Grade 2  . 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   per MRI multiple small strokes post  cardiac cath. --  residual mild memory loss  . History of methicillin resistant staphylococcus aureus (MRSA)   . Hypercholesterolemia   . Insomnia   . Insulin dependent type 2 diabetes mellitus, uncontrolled ([place]) dx 2004   endocrinologist-  dr Dwyane Dee-- last A1c 8.8 in Aug2018:  pt is noncompliant w/ diet, states does note eat breakfast , her first main meal in afternoon  . Neurogenic bladder   . Neuropathy, diabetic ([place])    hands and feet  . OSA (obstructive sleep apnea)    cpap intolerant  . Personality disorder ([place])   . Restless leg syndrome   . SOB (shortness of breath) on exertion   . Stroke ([place])   . SUI (stress urinary incontinence, female) S/P SLING 12-29-2011    Patient Active Problem List   Diagnosis Date Noted  . Acute metabolic encephalopathy 82/50/5397  . Toxic encephalopathy 05/09/2018  . GERD (gastroesophageal reflux disease) 05/09/2018  . Tobacco abuse 05/09/2018  . Polypharmacy 05/09/2018  . Restless leg syndrome 04/27/2018  . Depression with anxiety 04/27/2018  . Hyponatremia 04/26/2018  . Noncompliance with dietary restriction 04/26/2018  . Postoperative cellulitis of surgical wound 04/26/2018  . At high risk for falls 04/24/2018  . Noncompliance with safety precautions 04/24/2018  . 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 PAD (peripheral artery disease) ([place])   . Poorly controlled type 2 diabetes mellitus ([place])   . Cutaneous abscess of right foot   . Subacute osteomyelitis, right ankle and foot ([place])   . Major depressive disorder, recurrent episode ([place]) 04/11/2018  . Diabetic ulcer of right midfoot associated with type 2 diabetes mellitus, with necrosis of bone ([place])   . Abscess of ankle   . Infectious synovitis   . Diabetic foot infection ([place])   . Sepsis ([place]) 04/09/2018  . Cancer of sigmoid colon ([place]) 03/09/2015  . OSA (obstructive sleep apnea) 07/04/2014  . Migraine without aura, with intractable migraine, so stated, without mention of  status migrainosus 03/26/2014  . Peripheral neuropathy 03/03/2014  . Abnormal stress test 02/12/2014  . CVA (cerebral infarction) 02/12/2014  . Chest pain   . Abnormal heart rhythm   . Edema   . Hypertension   . Hyperlipemia   . Hypercholesterolemia   . Other and unspecified hyperlipidemia 11/14/2013  . Type II diabetes mellitus, uncontrolled ([place]) 07/22/2013  . Essential hypertension 07/22/2013  . Diabetic neuropathy with neurologic complication ([place]) 58/85/0277  . Diabetic polyneuropathy ([place]) 07/22/2013    Past Surgical History:  Procedure Laterality Date  . ABDOMINAL HYSTERECTOMY    . AMPUTATION Right 04/13/2018   Procedure: RIGHT BELOW KNEE AMPUTATION;  Surgeon: Newt Minion, MD;  Location: [facility];  Service: Orthopedics;  Laterality: Right;  . ANTERIOR CERVICAL DECOMP/DISCECTOMY FUSION  2000   C5 - 7  . CARDIAC CATHETERIZATION  05-22-2008   DR SKAINS   NO SIGNIFECANT CAD/ NORMAL LV/  EF 65%/  NO WALL MOTION ABNORMALITIES  . CARPAL TUNNEL RELEASE Right 04-25-2013  . [place]; 1992  . COLONOSCOPY    . CYSTO N/A 04/30/2013   Procedure: CYSTOSCOPY;  Surgeon: Reece Packer, MD;  Location: [facility];  Service: Urology;  Laterality: N/A;  . CYSTOSCOPY MACROPLASTIQUE IMPLANT N/A 02/06/2018   Procedure: CYSTOSCOPY MACROPLASTIQUE IMPLANT;  Surgeon: Bjorn Loser, MD;  Location: [facility];  Service: Urology;  Laterality: N/A;  . CYSTOSCOPY WITH INJECTION  05/04/2012   Procedure: CYSTOSCOPY WITH INJECTION;  Surgeon: Reece Packer, MD;  Location: [facility];  Service: Urology;  Laterality: N/A;  MACROPLASTIQUE INJECTION  . CYSTOSCOPY WITH INJECTION  08/28/2012   Procedure: CYSTOSCOPY WITH INJECTION;  Surgeon: Reece Packer, MD;  Location[facility];  Service: Urology;  Laterality: N/A;  cysto and macroplastique   . FOOT SURGERY Bilateral   . HERNIA REPAIR  ?1996   "stomach"  . INCISION AND DRAINAGE OF WOUND Right  04/11/2018   Procedure: IRRIGATION AND DEBRIDEMENT WOUND right foot and right ankle;  Surgeon: Evelina Bucy, DPM;  Location: [facility];  Service: Podiatry;  Laterality: Right;  . KNEE ARTHROSCOPY W/ ALLOGRAFT IMPANT Left    graft x 2  . KNEE SURGERY     TOTAL 8 SURG'S  . LAPAROSCOPIC SIGMOID COLECTOMY N/A 04/22/2015   Procedure: LAPAROSCOPIC HAND ASSISTED SIGMOID COLECTOMY;  Surgeon: Erroll Luna, MD;  Location: [facility];  Service: General;  Laterality: N/A;  . LEFT HEART CATHETERIZATION WITH CORONARY ANGIOGRAM N/A 02/12/2014   Procedure: LEFT HEART CATHETERIZATION WITH CORONARY ANGIOGRAM;  Surgeon: Candee Furbish, MD;  Location: [facility];  Service: Cardiovascular;  Laterality: N/A;   No angiographically significant CAD; normal LVSF, LVEDP 72mmHg,  EF 55% (new finding ef 30% myoview 01-08-2014)  . LUMBAR FUSION    . MANDIBLE  FRACTURE SURGERY    . MULTIPLE LAPAROSCOPIES FOR ENDOMETRIOSIS    . PUBOVAGINAL SLING  12/29/2011   Procedure: Gaynelle Arabian;  Surgeon: Reece Packer, MD;  Location: [facility];  Service: Urology;  Laterality: N/A;  cysto and sparc sling   . PUBOVAGINAL SLING N/A 04/30/2013   Procedure: REMOVAL OF VAGINAL MESH;  Surgeon: Reece Packer, MD;  Location: [facility];  Service: Urology;  Laterality: N/A;  . RECONSTURCTION OF CONGENITAL UTERUS ANOMALY  1983  . REPEAT RECONSTRUCTION ACL LEFT KNEE/ SCREWS REMOVED  03-28-2000   CADAVER GRAFT  . TOTAL ABDOMINAL HYSTERECTOMY W/ BILATERAL SALPINGOOPHORECTOMY  1997  . TRANSTHORACIC ECHOCARDIOGRAM  02/13/2014   ef 45%, hypokinesis base inferior and base inferolateral walls  . UPPER GI ENDOSCOPY    . WOUND DEBRIDEMENT Right 09/05/2016   Procedure: DEBRIDEMENT WOUND WITH GRAFT RIGHT FOOT;  Surgeon: Landis Martins, DPM;  Location: [facility];  Service: Podiatry;  Laterality: Right;  . WRIST FRACTURE SURGERY       OB History   None      Home Medications    Prior to Admission medications   Medication Sig Start Date  End Date Taking? Authorizing Provider  oxyCODONE-acetaminophen (PERCOCET) 10-325 MG tablet Take 1 tablet by mouth every 8 (eight) hours as needed for pain. 05/03/18  Yes Newt Minion, MD  acetaminophen (TYLENOL) 325 MG tablet Take 1-2 tablets (325-650 mg total) by mouth every 4 (four) hours as needed for mild pain. 04/23/18   Love, Ivan Anchors, PA-C  bisacodyl (DULCOLAX) 5 MG EC tablet Take 1 tablet (5 mg total) by mouth daily as needed for moderate constipation. 04/16/18   Elgergawy, Silver Huguenin, MD  clonazePAM (KLONOPIN) 0.5 MG tablet Take 1 tablet (0.5 mg total) by mouth daily as needed (anxiety). Do not take at night time. 05/10/18   Mariel Aloe, MD  docusate sodium (COLACE) 100 MG capsule Take 1 capsule (100 mg total) by mouth 2 (two) times daily. 04/16/18   Elgergawy, Silver Huguenin, MD  empagliflozin (JARDIANCE) 25 MG TABS tablet Take 25 mg by mouth daily.    [provider]  gabapentin (NEURONTIN) 600 MG tablet Take 1 tablet (600 mg total) by mouth 2 (two) times daily. Take in the morning and afternoon. Do not take at night time. 05/10/18   Mariel Aloe, MD  glucose blood (FREESTYLE LITE) test strip Use to check blood sugars up to 4 times daily. Dx Code E11.65 03/16/18   Elayne Snare, MD  HETLIOZ 20 MG CAPS Take 20 mg by mouth at bedtime. 05/02/18   [provider]  INGREZZA 80 MG CAPS Take 80 mg by mouth at bedtime. 05/01/18   [provider]  insulin aspart (NOVOLOG) 100 UNIT/ML injection Inject 10-35 Units into the skin 3 (three) times daily with meals. Take 10 units before your main meal. If blood sugars are consistently over 250 after the meal start taking 14 units before your main meal. 35 UNITS AT SUPPER    [provider]  Insulin Glargine (BASAGLAR KWIKPEN) 100 UNIT/ML SOPN Inject 36 Units into the skin at bedtime.    [provider]  lidocaine (LIDODERM) 5 % Place 1 patch onto the skin daily as needed (pain). Remove & Discard patch within 12 hours or as  directed by MD 02/27/18   Landis Martins, DPM  nicotine (NICODERM CQ - DOSED IN MG/24 HOURS) 21 mg/24hr patch Place 1 patch (21 mg total) onto the skin daily. 04/17/18  Elgergawy, Silver Huguenin, MD  ondansetron (ZOFRAN) 4 MG tablet Take 1 tablet (4 mg total) by mouth every 6 (six) hours as needed for nausea. 04/16/18   Elgergawy, Silver Huguenin, MD  pantoprazole (PROTONIX) 40 MG tablet Take 1 tablet (40 mg total) by mouth daily. 04/27/18   Love, Ivan Anchors, PA-C  QUEtiapine (SEROQUEL) 300 MG tablet Take 1 tablet (300 mg total) by mouth at bedtime. 05/10/18   Mariel Aloe, MD  VRAYLAR 4.5 MG CAPS Take 1 capsule by mouth at bedtime. 05/04/18   [provider]    Family History Family History  Problem Relation Age of Onset  . Hypertension Mother   . Diabetes Mother   . Cancer - Other Mother        lymphoma   . Cancer - Other Father        lung, bladder cancer   . Heart attack Father   . Cancer - Other Brother        bladder cancer     Social History Social History   Tobacco Use  . Smoking status: Current Every Day Smoker    Packs/day: 4.00    Years: 45.00    Pack years: 180.00    Types: Cigarettes  . Smokeless tobacco: Never Used  . Tobacco comment: per pt started smoking  age 29  Substance Use Topics  . Alcohol use: No    Alcohol/week: 0.0 oz  . Drug use: No     Allergies   Latex; Sweetening enhancer [flavoring agent]; Aspartame and phenylalanine; Ibuprofen; Trazodone and nefazodone; and Triazolam   Review of Systems Review of Systems  Level 5 caveat applies due to altered mental status Physical Exam Updated Vital Signs BP (!) 161/80   Pulse 96   Temp 97.8 F (36.6 C) (Oral)   Resp 17   SpO2 98%   Physical Exam  Constitutional: She appears well-developed and well-nourished. No distress.  HENT:  Head: Normocephalic and atraumatic.  Mouth/Throat: Oropharynx is clear and moist.  Eyes: Pupils are equal, round, and reactive to light.  Neck: Normal range of motion. Neck  supple.  Cardiovascular: Normal rate, regular rhythm and normal heart sounds. Exam reveals no gallop and no friction rub.  No murmur heard. Pulmonary/Chest: Effort normal and breath sounds normal. No respiratory distress. She has no wheezes.  Abdominal: Soft. Bowel sounds are normal. She exhibits no distension. There is no tenderness.  Neurological: She is alert. She exhibits normal muscle tone. Coordination normal.  Skin: Skin is warm and dry. Capillary refill takes less than 2 seconds. No rash noted. No erythema.  Psychiatric: She has a normal mood and affect. Her behavior is normal. She is actively hallucinating.  Nursing note and vitals reviewed.    ED Treatments / Results  Labs (all labs ordered are listed, but only abnormal results are displayed) Labs Reviewed  COMPREHENSIVE METABOLIC PANEL - Abnormal; Notable for the following components:      Result Value   Chloride 98 (*)    Glucose, Bld 273 (*)    Creatinine, Ser 1.02 (*)    All other components within normal limits  CBC WITH DIFFERENTIAL/PLATELET - Abnormal; Notable for the following components:   WBC 12.5 (*)    Neutro Abs 9.1 (*)    All other components within normal limits  ETHANOL  AMMONIA  URINALYSIS, ROUTINE W REFLEX MICROSCOPIC  RAPID URINE DRUG SCREEN, HOSP PERFORMED  I-STAT CG4 LACTIC ACID, ED    EKG EKG Interpretation  Date/Time:  Tuesday May 22 2018 19:54:45 EDT Ventricular Rate:  91 PR Interval:    QRS Duration: 94 QT Interval:  384 QTC Calculation: 473 R Axis:   59 Text Interpretation:  Sinus rhythm Baseline wander in lead(s) V3 When compared to prior, no significant changes seen.  No STEMI Confirmed by Antony Blackbird [phone]) on 05/23/2018 12:31:17 AM   Radiology Dg Chest 2 View  Result Date: 05/22/2018 CLINICAL DATA:  Shortness of breath, central chest pain EXAM: CHEST - 2 VIEW COMPARISON:  05/09/2018 FINDINGS: Lungs are clear.  No pleural effusion or pneumothorax. The heart is normal in size.  Mild degenerative changes of the visualized thoracolumbar spine. Cervical spine fixation hardware. IMPRESSION: Normal chest radiographs. Electronically Signed   By: Julian Hy M.D.   On: 05/22/2018 21:24   Ct Head Wo Contrast  Result Date: 05/22/2018 CLINICAL DATA:  Status post fall. Acute onset of altered mental status. EXAM: CT HEAD WITHOUT CONTRAST TECHNIQUE: Contiguous axial images were obtained from the base of the skull through the vertex without intravenous contrast. COMPARISON:  CT of the head performed 05/09/2018 FINDINGS: Brain: No evidence of acute infarction, hemorrhage, hydrocephalus, extra-axial collection or mass lesion/mass effect. The posterior fossa, including the cerebellum, brainstem and fourth ventricle, is within normal limits. The third and lateral ventricles, and basal ganglia are unremarkable in appearance. The cerebral hemispheres are symmetric in appearance, with normal gray-white differentiation. No mass effect or midline shift is seen. Vascular: No hyperdense vessel or unexpected calcification. Skull: There is no evidence of fracture; visualized osseous structures are unremarkable in appearance. Sinuses/Orbits: The visualized portions of the orbits are within normal limits. The paranasal sinuses and mastoid air cells are well-aerated. Other: No significant soft tissue abnormalities are seen. IMPRESSION: No evidence of traumatic intracranial injury or fracture. Electronically Signed   By: Garald Balding M.D.   On: 05/22/2018 23:30    Procedures Procedures (including critical care time)  Medications Ordered in ED Medications - No data to display   Initial Impression / Assessment and Plan / ED Course  I have reviewed the triage vital signs and the nursing notes.  Pertinent labs & imaging results that were available during my care of the patient were reviewed by me and considered in my medical decision making (see chart for details).     I feel that the patient  will need admission for undifferentiated encephalopathy she had a recent admission for similar symptoms.  The husband states that the symptoms started acutely earlier this morning.  Patient is unable to give any accurate history.  Final Clinical Impressions(s) / ED Diagnoses   Final diagnoses:  None    ED Discharge Orders    None       Dalia Heading, PA-C 05/23/18 [id number]    Tegeler, Gwenyth Allegra, MD 05/23/18 [phone]

## 2018-05-23 NOTE — ED Notes (Addendum)
RN heard a commotion and went to the pt's room.  She was laying on the floor, stated she had fallen, however it was a unwitnessed fall.  Dr. Melanee Left came bedside immediately and checked the pt out.  RN also called Dr. Blaine Hamper and informed him.  Pt will have safety sitter at all times.  She is disoriented and "forgets she only has one leg."    Agricultural consultant notified

## 2018-05-23 NOTE — ED Notes (Signed)
Pt asked to speak to her husband, called him and now shes asking him to bring her a change of clothes, shoes and take her home. RN informed patient she will be admitted by hospitalists

## 2018-05-23 NOTE — ED Notes (Signed)
Pt attempting to get out of bed; states she wants to smoke, leaned over the bedrail hallucinating stating there was someones dirty ash tray on the floor, this RN at bedside did not observe an ash tray; pt then attempted to scoot the end of the bed to stand and this RN encouraged her to stay in bed as she forgets she has a new amputation and cannot walk well; pt then got physically and verbally aggressive shoving this RN away and yelling get the "f**k" out of my room; RN yelled for help and pt was encouraged to scoot back in bed and lie down

## 2018-05-23 NOTE — ED Notes (Signed)
Report given to telesitter.

## 2018-05-23 NOTE — ED Notes (Signed)
Attempted to have patient use bedpan x 2 without success; pt insistent on using restroom, RN told her she was an extreme fall risk Will get a bedside commode

## 2018-05-23 NOTE — ED Notes (Signed)
Tele sitter on and Pt given activity apron. Pt relaxed and busy with apron.

## 2018-05-23 NOTE — ED Notes (Addendum)
This RN spoke with pts husband Waunita Schooner, updated about plan of care for pt. No other questions at this time. Waunita Schooner [phone]

## 2018-05-23 NOTE — ED Notes (Signed)
Pt aggitated, ripping off monitoring equipment.  RN was able to verbally redirect pt, she allowed RN to reapply.  Pt states she is leaving, again, RN able to redirect pt.

## 2018-05-23 NOTE — ED Provider Notes (Signed)
Called to see patient after she had unwitnessed fall while attempting to get out of bed.  Denies hitting head or losing consciousness.  Patient admitted with confusion and recurrent falls in setting of recent BKA.  Complains of pain to bilateral elbows.  No C-spine pain.  Full range of motion of elbows without bony tenderness.   Ezequiel Essex, MD 05/23/18 [phone]

## 2018-05-23 NOTE — Progress Notes (Signed)
  PROGRESS NOTE    [name]  JQG:[id number] DOB: 25-May-1966 DOA: 05/22/2018 PCP: Tamsen Roers, MD   52 year old female with history of hypertension hyperlipidemia, diabetes mellitus, COPD, stroke, depression, obstructive sleep apnea, not on CPAP, restless leg syndrome, stage I colon cancer status post colectomy comes to the hospital with complaints of altered mental status.  Initially there was concerns for polypharmacy as this is happened to her before.  CT of the head was negative.  UA was suggestive of urinary tract infection therefore started on Rocephin. When I saw the patient she was awake and alert but had slight disorientation in her memory as she was unable to clearly recall all the events over the last few days.  Vitals:   05/23/18 1136 05/23/18 1145  BP:  139/66  Pulse: 92   Resp: 16 (!) 23  Temp:    SpO2: 96%    Constitutional: NAD, calm, comfortable Eyes: PERRL, lids and conjunctivae normal ENMT: Mucous membranes are moist. Posterior pharynx clear of any exudate or lesions.Normal dentition.  Neck: normal, supple, no masses, no thyromegaly Respiratory: Diminished breath sounds at the bases Cardiovascular: Regular rate and rhythm, no murmurs / rubs / gallops. No extremity edema. 2+ pedal pulses. No carotid bruits.  Abdomen: no tenderness, no masses palpated. No hepatosplenomegaly. Bowel sounds positive.  Musculoskeletal: Right-sided BKA noted Skin: no rashes, lesions, ulcers. No induration Neurologic: CN 2-12 grossly intact. Sensation intact, DTR normal. Strength 5/5 in all 4.  Psychiatric:  Alert and oriented x 2.    Assessment and plan:  Altered mental status/acute metabolic encephalopathy-multifactorial; still not at baseline.  Polypharmacy as she is on multiple sedative medications Urinary tract infection - Holding off on all the sedative medication at this time.  CT of the head is negative.  Her mentation is improving, continue IV fluids.  UA is suggestive of  urinary tract infection, urine culture has been ordered but patient has already received 1 dose of IV Rocephin.  Continue IV antibiotics. She is still little confused with AAOx2 (baseline AAOx4).   Rest of the care as originally planned.   Ankit Arsenio Loader, MD [facility] Pager [phone]   If 7PM-7AM, please contact night-coverage [email or web address] Password [facility] 05/23/2018, 12:59 PM

## 2018-05-23 NOTE — ED Notes (Signed)
Posie bed alarm placed for safety

## 2018-05-23 NOTE — ED Notes (Signed)
This RN spoke with staffing office regarding pts order for safety sitter, they state that they do not have one at this time.

## 2018-05-23 NOTE — ED Notes (Signed)
Admitting at bedside 

## 2018-05-24 ENCOUNTER — Ambulatory Visit (INDEPENDENT_AMBULATORY_CARE_PROVIDER_SITE_OTHER): Payer: Medicare Other | Admitting: Orthopedic Surgery

## 2018-05-24 ENCOUNTER — Other Ambulatory Visit: Payer: Self-pay

## 2018-05-24 ENCOUNTER — Encounter (HOSPITAL_COMMUNITY): Payer: Self-pay | Admitting: General Practice

## 2018-05-24 DIAGNOSIS — Y835 Amputation of limb(s) as the cause of abnormal reaction of the patient, or of later complication, without mention of misadventure at the time of the procedure: Secondary | ICD-10-CM | POA: Diagnosis present

## 2018-05-24 DIAGNOSIS — F17210 Nicotine dependence, cigarettes, uncomplicated: Secondary | ICD-10-CM

## 2018-05-24 DIAGNOSIS — R109 Unspecified abdominal pain: Secondary | ICD-10-CM

## 2018-05-24 DIAGNOSIS — R441 Visual hallucinations: Secondary | ICD-10-CM | POA: Diagnosis present

## 2018-05-24 DIAGNOSIS — N390 Urinary tract infection, site not specified: Principal | ICD-10-CM

## 2018-05-24 DIAGNOSIS — Z818 Family history of other mental and behavioral disorders: Secondary | ICD-10-CM | POA: Diagnosis not present

## 2018-05-24 DIAGNOSIS — E1140 Type 2 diabetes mellitus with diabetic neuropathy, unspecified: Secondary | ICD-10-CM | POA: Diagnosis present

## 2018-05-24 DIAGNOSIS — G2581 Restless legs syndrome: Secondary | ICD-10-CM | POA: Diagnosis present

## 2018-05-24 DIAGNOSIS — I10 Essential (primary) hypertension: Secondary | ICD-10-CM | POA: Diagnosis present

## 2018-05-24 DIAGNOSIS — E11621 Type 2 diabetes mellitus with foot ulcer: Secondary | ICD-10-CM | POA: Diagnosis present

## 2018-05-24 DIAGNOSIS — M2550 Pain in unspecified joint: Secondary | ICD-10-CM

## 2018-05-24 DIAGNOSIS — L03115 Cellulitis of right lower limb: Secondary | ICD-10-CM | POA: Diagnosis present

## 2018-05-24 DIAGNOSIS — E785 Hyperlipidemia, unspecified: Secondary | ICD-10-CM | POA: Diagnosis present

## 2018-05-24 DIAGNOSIS — F319 Bipolar disorder, unspecified: Secondary | ICD-10-CM | POA: Diagnosis present

## 2018-05-24 DIAGNOSIS — F419 Anxiety disorder, unspecified: Secondary | ICD-10-CM | POA: Diagnosis not present

## 2018-05-24 DIAGNOSIS — M542 Cervicalgia: Secondary | ICD-10-CM

## 2018-05-24 DIAGNOSIS — N319 Neuromuscular dysfunction of bladder, unspecified: Secondary | ICD-10-CM | POA: Diagnosis present

## 2018-05-24 DIAGNOSIS — E1165 Type 2 diabetes mellitus with hyperglycemia: Secondary | ICD-10-CM | POA: Diagnosis present

## 2018-05-24 DIAGNOSIS — T8781 Dehiscence of amputation stump: Secondary | ICD-10-CM | POA: Diagnosis present

## 2018-05-24 DIAGNOSIS — K219 Gastro-esophageal reflux disease without esophagitis: Secondary | ICD-10-CM | POA: Diagnosis present

## 2018-05-24 DIAGNOSIS — F609 Personality disorder, unspecified: Secondary | ICD-10-CM | POA: Diagnosis present

## 2018-05-24 DIAGNOSIS — R4182 Altered mental status, unspecified: Secondary | ICD-10-CM | POA: Diagnosis not present

## 2018-05-24 DIAGNOSIS — G9341 Metabolic encephalopathy: Secondary | ICD-10-CM | POA: Diagnosis not present

## 2018-05-24 DIAGNOSIS — L97529 Non-pressure chronic ulcer of other part of left foot with unspecified severity: Secondary | ICD-10-CM | POA: Diagnosis present

## 2018-05-24 DIAGNOSIS — E7800 Pure hypercholesterolemia, unspecified: Secondary | ICD-10-CM | POA: Diagnosis present

## 2018-05-24 DIAGNOSIS — W06XXXA Fall from bed, initial encounter: Secondary | ICD-10-CM | POA: Diagnosis present

## 2018-05-24 DIAGNOSIS — J449 Chronic obstructive pulmonary disease, unspecified: Secondary | ICD-10-CM | POA: Diagnosis present

## 2018-05-24 DIAGNOSIS — E11610 Type 2 diabetes mellitus with diabetic neuropathic arthropathy: Secondary | ICD-10-CM | POA: Diagnosis present

## 2018-05-24 DIAGNOSIS — G4733 Obstructive sleep apnea (adult) (pediatric): Secondary | ICD-10-CM | POA: Diagnosis present

## 2018-05-24 DIAGNOSIS — E1151 Type 2 diabetes mellitus with diabetic peripheral angiopathy without gangrene: Secondary | ICD-10-CM | POA: Diagnosis present

## 2018-05-24 DIAGNOSIS — Y92009 Unspecified place in unspecified non-institutional (private) residence as the place of occurrence of the external cause: Secondary | ICD-10-CM | POA: Diagnosis not present

## 2018-05-24 DIAGNOSIS — F4310 Post-traumatic stress disorder, unspecified: Secondary | ICD-10-CM | POA: Diagnosis present

## 2018-05-24 LAB — GLUCOSE, CAPILLARY
GLUCOSE-CAPILLARY: 174 mg/dL — AB (ref 65–99)
GLUCOSE-CAPILLARY: 312 mg/dL — AB (ref 65–99)
GLUCOSE-CAPILLARY: 204 mg/dL — AB (ref 65–99)
Glucose-Capillary: 283 mg/dL — ABNORMAL HIGH (ref 65–99)
Glucose-Capillary: 337 mg/dL — ABNORMAL HIGH (ref 65–99)

## 2018-05-24 MED ORDER — QUETIAPINE FUMARATE 50 MG PO TABS
150.0000 mg | ORAL_TABLET | Freq: Every day | ORAL | Status: DC
  Administered 2018-05-25: 150 mg via ORAL
  Filled 2018-05-24: qty 1

## 2018-05-24 MED ORDER — PENTOXIFYLLINE ER 400 MG PO TBCR
400.0000 mg | EXTENDED_RELEASE_TABLET | Freq: Three times a day (TID) | ORAL | Status: DC
  Administered 2018-05-24 – 2018-05-25 (×3): 400 mg via ORAL
  Filled 2018-05-24 (×4): qty 1

## 2018-05-24 NOTE — Consult Note (Addendum)
ORTHOPAEDIC CONSULTATION  REQUESTING PHYSICIAN: Damita Lack, MD  Chief Complaint: Pain redness wound dehiscence right transtibial amputation.  HPI: [name] is a 52 y.o. 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  Patient is unaware of where her stump shrinker is she has not been using compression patient complains of pain.  Past Medical History:  Diagnosis Date  . Anxiety   . Arthritis   . Bipolar disorder ([place])   . Broken jaw ([place])   . Broken wrist   . Chronic back pain   . Claudication ([place])    a. 12/2013 ABI's: R 0.97, L 0.94.  [name] COPD (chronic obstructive pulmonary disease) ([place])   . Depression   . Diabetic Charcot's foot ([place])   . Diabetic foot ulcer (H[facility])    chronic left foot ulcer , great toe/  hx recurrent foot ulcer bilaterally  . DJD (degenerative joint disease)   . Edema of both lower extremities   . Episode of memory loss   . GERD (gastroesophageal reflux disease)   . Headache(784.0)   . Hiatal hernia   . History of colon cancer, stage I dx 02-26-2015--- oncologist-- dr [name]--- per last note no recurrence   05-16-2015 s/p  Laparoscopic sigmoid colectomy w/ node bx's (negative nodes per path)  Stage I (pT1,N0,M0) Grade 2  . 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   per MRI multiple small strokes post cardiac cath. --  residual mild memory loss  . History of methicillin resistant staphylococcus aureus (MRSA)   . Hypercholesterolemia   . Insomnia   . Insulin dependent type 2 diabetes mellitus, uncontrolled ([place]) dx 2004   endocrinologist-  dr Dwyane Dee-- last A1c 8.8 in Aug2018:  pt is noncompliant w/ diet, states does note eat breakfast , her first main meal in afternoon  . Neurogenic bladder   . Neuropathy, diabetic ([place])    hands and feet  . OSA (obstructive sleep apnea)    cpap intolerant  . Personality disorder ([place])   . Restless leg syndrome   . SOB (shortness of breath) on  exertion   . Stroke ([place])   . SUI (stress urinary incontinence, female) S/P SLING 12-29-2011   Past Surgical History:  Procedure Laterality Date  . ABDOMINAL HYSTERECTOMY    . AMPUTATION Right 04/13/2018   Procedure: RIGHT BELOW KNEE AMPUTATION;  Surgeon: Newt Minion, MD;  Location: [place];  Service: Orthopedics;  Laterality: Right;  . ANTERIOR CERVICAL DECOMP/DISCECTOMY FUSION  2000   C5 - 7  . CARDIAC CATHETERIZATION  05-22-2008   DR SKAINS   NO SIGNIFECANT CAD/ NORMAL LV/  EF 65%/  NO WALL MOTION ABNORMALITIES  . CARPAL TUNNEL RELEASE Right 04-25-2013  . [place]; 1992  . COLONOSCOPY    . CYSTO N/A 04/30/2013   Procedure: CYSTOSCOPY;  Surgeon: Reece Packer, MD;  Location: [facility];  Service: Urology;  Laterality: N/A;  . CYSTOSCOPY MACROPLASTIQUE IMPLANT N/A 02/06/2018   Procedure: CYSTOSCOPY MACROPLASTIQUE IMPLANT;  Surgeon: Bjorn Loser, MD;  Location: [facility];  Service: Urology;  Laterality: N/A;  . CYSTOSCOPY WITH INJECTION  05/04/2012   Procedure: CYSTOSCOPY WITH INJECTION;  Surgeon: Reece Packer, MD;  Location: [place];  Service: Urology;  Laterality: N/A;  MACROPLASTIQUE INJECTION  . CYSTOSCOPY WITH INJECTION  08/28/2012   Procedure: CYSTOSCOPY WITH INJECTION;  Surgeon: Reece Packer, MD;  Location: [place];  Service:  Urology;  Laterality: N/A;  cysto and macroplastique   . FOOT SURGERY Bilateral   . HERNIA REPAIR  ?1996   "stomach"  . INCISION AND DRAINAGE OF WOUND Right 04/11/2018   Procedure: IRRIGATION AND DEBRIDEMENT WOUND right foot and right ankle;  Surgeon: Evelina Bucy, DPM;  Location: [facility];  Service: Podiatry;  Laterality: Right;  . KNEE ARTHROSCOPY W/ ALLOGRAFT IMPANT Left    graft x 2  . KNEE SURGERY     TOTAL 8 SURG'S  . LAPAROSCOPIC SIGMOID COLECTOMY N/A 04/22/2015   Procedure: LAPAROSCOPIC HAND ASSISTED SIGMOID COLECTOMY;  Surgeon: Erroll Luna, MD;  Location: [place];   Service: General;  Laterality: N/A;  . LEFT HEART CATHETERIZATION WITH CORONARY ANGIOGRAM N/A 02/12/2014   Procedure: LEFT HEART CATHETERIZATION WITH CORONARY ANGIOGRAM;  Surgeon: Candee Furbish, MD;  Location: [facility];  Service: Cardiovascular;  Laterality: N/A;   No angiographically significant CAD; normal LVSF, LVEDP 17mmHg,  EF 55% (new finding ef 30% myoview 01-08-2014)  . LUMBAR FUSION    . MANDIBLE FRACTURE SURGERY    . MULTIPLE LAPAROSCOPIES FOR ENDOMETRIOSIS    . PUBOVAGINAL SLING  12/29/2011   Procedure: Gaynelle Arabian;  Surgeon: Reece Packer, MD;  [facility];  Service: Urology;  Laterality: N/A;  cysto and sparc sling   . PUBOVAGINAL SLING N/A 04/30/2013   Procedure: REMOVAL OF VAGINAL MESH;  Surgeon: Reece Packer, MD;  Location: [facility];  Service: Urology;  Laterality: N/A;  . RECONSTURCTION OF CONGENITAL UTERUS ANOMALY  1983  . REPEAT RECONSTRUCTION ACL LEFT KNEE/ SCREWS REMOVED  03-28-2000   CADAVER GRAFT  . TOTAL ABDOMINAL HYSTERECTOMY W/ BILATERAL SALPINGOOPHORECTOMY  1997  . TRANSTHORACIC ECHOCARDIOGRAM  02/13/2014   ef 45%, hypokinesis base inferior and base inferolateral walls  . UPPER GI ENDOSCOPY    . WOUND DEBRIDEMENT Right 09/05/2016   Procedure: DEBRIDEMENT WOUND WITH GRAFT RIGHT FOOT;  Surgeon: Landis Martins, DPM;  Location: [facility];  Service: Podiatry;  Laterality: Right;  . WRIST FRACTURE SURGERY     Social History   Socioeconomic History  . Marital status: Married    Spouse name: Not on file  . Number of children: 2  . Years of education: 6th  . Highest education level: Not on file  Occupational History  . Not on file  Social Needs  . Financial resource strain: Not on file  . Food insecurity:    Worry: Not on file    Inability: Not on file  . Transportation needs:    Medical: Not on file    Non-medical: Not on file  Tobacco Use  . Smoking status: Current Every Day Smoker    Packs/day: 4.00    Years: 45.00    Pack  years: 180.00    Types: Cigarettes  . Smokeless tobacco: Never Used  . Tobacco comment: per pt started smoking  age 89  Substance and Sexual Activity  . Alcohol use: No    Alcohol/week: 0.0 oz  . Drug use: No  . Sexual activity: Yes  Lifestyle  . Physical activity:    Days per week: Not on file    Minutes per session: Not on file  . Stress: Not on file  Relationships  . Social connections:    Talks on phone: Not on file    Gets together: Not on file    Attends religious service: Not on file    Active member of club or organization: Not on file  Attends meetings of clubs or organizations: Not on file    Relationship status: Not on file  Other Topics Concern  . Not on file  Social History Narrative   Patient is divorced and lives alone- independent in ADLs, Drives   Patient has two adult children- grown son and daughter who is special needs in a home   Patient is disabled since 28   Patient has a 6th grade education.   Patient is right-handed.   Depression-medication and therapist   Patient drinks 2- 3 liters of soda and drinks tea but not everyday.    Does not routinely exercise.      Patient reports that she drinks "a lot" of caffeine daily    Family History  Problem Relation Age of Onset  . Hypertension Mother   . Diabetes Mother   . Cancer - Other Mother        lymphoma   . Cancer - Other Father        lung, bladder cancer   . Heart attack Father   . Cancer - Other Brother        bladder cancer    - negative except otherwise stated in the family history section Allergies  Allergen Reactions  . Latex Itching, Rash and Other (See Comments)    Pt states she cannot use condoms - cause an infection.  Use of latex on skin is okay.  Tape causes rash  . Sweetening Enhancer [Flavoring Agent] Nausea And Vomiting and Other (See Comments)    HEADACHES  . Aspartame And Phenylalanine Nausea And Vomiting    HEADACHES  . Ibuprofen Other (See Comments)    HEADACHES  .  Trazodone And Nefazodone Other (See Comments)    Hallucinations   . Triazolam Other (See Comments)    HALLUCINATIONS   Prior to Admission medications   Medication Sig Start Date End Date Taking? Authorizing Provider  oxyCODONE-acetaminophen (PERCOCET) 10-325 MG tablet Take 1 tablet by mouth every 8 (eight) hours as needed for pain. 05/03/18  Yes Newt Minion, MD  acetaminophen (TYLENOL) 325 MG tablet Take 1-2 tablets (325-650 mg total) by mouth every 4 (four) hours as needed for mild pain. 04/23/18   Love, Ivan Anchors, PA-C  bisacodyl (DULCOLAX) 5 MG EC tablet Take 1 tablet (5 mg total) by mouth daily as needed for moderate constipation. 04/16/18   Elgergawy, Silver Huguenin, MD  clonazePAM (KLONOPIN) 0.5 MG tablet Take 1 tablet (0.5 mg total) by mouth daily as needed (anxiety). Do not take at night time. 05/10/18   Mariel Aloe, MD  docusate sodium (COLACE) 100 MG capsule Take 1 capsule (100 mg total) by mouth 2 (two) times daily. 04/16/18   Elgergawy, Silver Huguenin, MD  empagliflozin (JARDIANCE) 25 MG TABS tablet Take 25 mg by mouth daily.    [provider]  gabapentin (NEURONTIN) 600 MG tablet Take 1 tablet (600 mg total) by mouth 2 (two) times daily. Take in the morning and afternoon. Do not take at night time. 05/10/18   Mariel Aloe, MD  glucose blood (FREESTYLE LITE) test strip Use to check blood sugars up to 4 times daily. Dx Code E11.65 03/16/18   Elayne Snare, MD  HETLIOZ 20 MG CAPS Take 20 mg by mouth at bedtime. 05/02/18   [provider]  INGREZZA 80 MG CAPS Take 80 mg by mouth at bedtime. 05/01/18   [provider]  insulin aspart (NOVOLOG) 100 UNIT/ML injection Inject 10-35 Units into the skin 3 (  three) times daily with meals. Take 10 units before your main meal. If blood sugars are consistently over 250 after the meal start taking 14 units before your main meal. 35 UNITS AT SUPPER    [provider]  Insulin Glargine (BASAGLAR KWIKPEN) 100 UNIT/ML SOPN Inject 36  Units into the skin at bedtime.    [provider]  lidocaine (LIDODERM) 5 % Place 1 patch onto the skin daily as needed (pain). Remove & Discard patch within 12 hours or as directed by MD 02/27/18   Landis Martins, DPM  nicotine (NICODERM CQ - DOSED IN MG/24 HOURS) 21 mg/24hr patch Place 1 patch (21 mg total) onto the skin daily. 04/17/18   Elgergawy, Silver Huguenin, MD  ondansetron (ZOFRAN) 4 MG tablet Take 1 tablet (4 mg total) by mouth every 6 (six) hours as needed for nausea. 04/16/18   Elgergawy, Silver Huguenin, MD  pantoprazole (PROTONIX) 40 MG tablet Take 1 tablet (40 mg total) by mouth daily. 04/27/18   Love, Ivan Anchors, PA-C  QUEtiapine (SEROQUEL) 300 MG tablet Take 1 tablet (300 mg total) by mouth at bedtime. 05/10/18   Mariel Aloe, MD  VRAYLAR 4.5 MG CAPS Take 1 capsule by mouth at bedtime. 05/04/18   [provider]   Dg Chest 2 View  Result Date: 05/22/2018 CLINICAL DATA:  Shortness of breath, central chest pain EXAM: CHEST - 2 VIEW COMPARISON:  05/09/2018 FINDINGS: Lungs are clear.  No pleural effusion or pneumothorax. The heart is normal in size. Mild degenerative changes of the visualized thoracolumbar spine. Cervical spine fixation hardware. IMPRESSION: Normal chest radiographs. Electronically Signed   By: Julian Hy M.D.   On: 05/22/2018 21:24   Ct Head Wo Contrast  Result Date: 05/22/2018 CLINICAL DATA:  Status post fall. Acute onset of altered mental status. EXAM: CT HEAD WITHOUT CONTRAST TECHNIQUE: Contiguous axial images were obtained from the base of the skull through the vertex without intravenous contrast. COMPARISON:  CT of the head performed 05/09/2018 FINDINGS: Brain: No evidence of acute infarction, hemorrhage, hydrocephalus, extra-axial collection or mass lesion/mass effect. The posterior fossa, including the cerebellum, brainstem and fourth ventricle, is within normal limits. The third and lateral ventricles, and basal ganglia are unremarkable in appearance. The  cerebral hemispheres are symmetric in appearance, with normal gray-white differentiation. No mass effect or midline shift is seen. Vascular: No hyperdense vessel or unexpected calcification. Skull: There is no evidence of fracture; visualized osseous structures are unremarkable in appearance. Sinuses/Orbits: The visualized portions of the orbits are within normal limits. The paranasal sinuses and mastoid air cells are well-aerated. Other: No significant soft tissue abnormalities are seen. IMPRESSION: No evidence of traumatic intracranial injury or fracture. Electronically Signed   By: Garald Balding M.D.   On: 05/22/2018 23:30   - pertinent xrays, CT, MRI studies were reviewed and independently interpreted  Positive ROS: All other systems have been reviewed and were otherwise negative with the exception of those mentioned in the HPI and as above.  Physical Exam: General: Alert, no acute distress Psychiatric: Patient is competent for consent with normal mood and affect Lymphatic: No axillary or cervical lymphadenopathy Cardiovascular: No pedal edema Respiratory: No cyanosis, no use of accessory musculature GI: No organomegaly, abdomen is soft and non-tender    Images:  @ENCIMAGES @  Labs:  Lab Results  Component Value Date   HGBA1C 9.2 03/19/2018   HGBA1C 10.0 12/11/2017   HGBA1C 8.8 08/08/2017   ESRSEDRATE 55 (H) 02/19/2016   CRP 17.4 (H)  02/19/2016   LABURIC 5.8 02/19/2016   REPTSTATUS 05/14/2018 FINAL 05/09/2018   GRAMSTAIN  04/11/2018    RARE WBC PRESENT, PREDOMINANTLY PMN NO ORGANISMS SEEN    CULT  05/09/2018    NO GROWTH 5 DAYS Performed at [facility], [place] [address], [place], [place] [address]    LABORGA METHICILLIN RESISTANT STAPHYLOCOCCUS AUREUS 04/11/2018    Lab Results  Component Value Date   ALBUMIN 3.8 05/22/2018   ALBUMIN 3.9 05/09/2018   ALBUMIN 2.9 (L) 04/26/2018   LABURIC 5.8 02/19/2016    Neurologic: Patient does not have protective sensation  bilateral lower extremities.   MUSCULOSKELETAL:   Skin: Examination patient has a black eschar which approximately 3 mm wide the length of the surgical incision.  The area has some mild redness approximately 1 cm around the surgical incision there is no odor no drainage.  Patient has full extension of her knee.  There is venous stasis swelling with edema.  Assessment: Assessment: Right transtibial amputation with slight dehiscence with edema and swelling without purulence with mild cellulitis.  Plan: Plan: IV antibiotics  during her hospital stay, will have [employer] provide a stump shrinker.  I will follow-up in the office in 1 week.  I have also ordered Trental to help with her microcirculation.  Thank you for the consult and the opportunity to see Ms. Herma Ard, [place] [phone] 8:12 AM

## 2018-05-24 NOTE — Consult Note (Signed)
[facility] Consult   Reason for Consult: Medication management and concern for depression Referring Physician: Dr. Reesa Chew Patient Identification: [name] MRN:  [id number] Principal Diagnosis: Anxiety Diagnosis:   Patient Active Problem List   Diagnosis Date Noted  . UTI (urinary tract infection) [N39.0] 05/23/2018  . Acute metabolic encephalopathy [V78.46] 05/10/2018  . Toxic encephalopathy [G92] 05/09/2018  . GERD (gastroesophageal reflux disease) [K21.9] 05/09/2018  . Tobacco abuse [Z72.0] 05/09/2018  . Polypharmacy [Z79.899] 05/09/2018  . Restless leg syndrome [G25.81] 04/27/2018  . Depression with anxiety [F41.8] 04/27/2018  . Hyponatremia [E87.1] 04/26/2018  . Noncompliance with dietary restriction [Z91.11] 04/26/2018  . Postoperative cellulitis of surgical wound [T81.49XA] 04/26/2018  . At high risk for falls [Z91.81] 04/24/2018  . Noncompliance with safety precautions [Z91.19] 04/24/2018  . Leukocytosis [D72.829] 04/24/2018  . 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 PAD (peripheral artery disease) ([place]) [I73.9]   . Poorly controlled type 2 diabetes mellitus ([place]) [E11.65]   . Cutaneous abscess of right foot [L02.611]   . Subacute osteomyelitis, right ankle and foot ([place]) [M86.271]   . Major depressive disorder, recurrent episode ([place]) [F33.9] 04/11/2018  . Diabetic ulcer of right midfoot associated with type 2 diabetes mellitus, with necrosis of bone ([place]) [U13.244, L97.414]   . Abscess of ankle [L02.419]   . Infectious synovitis [M65.10]   . Diabetic foot infection ([place]) [W10.272, L08.9]   . Sepsis ([place]) [A41.9] 04/09/2018  . Cancer of sigmoid colon ([place]) [C18.7] 03/09/2015  . OSA (obstructive sleep apnea) [G47.33] 07/04/2014  . Migraine without aura, with intractable migraine, so stated, without mention of status migrainosus [G43.019] 03/26/2014  . Peripheral neuropathy [G62.9] 03/03/2014  . Abnormal stress test [R94.39] 02/12/2014   . CVA (cerebral infarction) [I63.9] 02/12/2014  . Chest pain [R07.9]   . Abnormal heart rhythm [I49.9]   . Edema [R60.9]   . Hypertension [I10]   . Hyperlipemia [E78.5]   . Hypercholesterolemia [E78.00]   . Other and unspecified hyperlipidemia [E78.5] 11/14/2013  . Type II diabetes mellitus, uncontrolled ([place]) [E11.65] 07/22/2013  . Essential hypertension [I10] 07/22/2013  . Diabetic neuropathy with neurologic complication ([place]) [Z36.64, E11.49] 07/22/2013  . Diabetic polyneuropathy ([place]) [E11.42] 07/22/2013    Total Time spent with patient: 1 hour  Subjective:   [name] is a 52 y.o. female patient admitted with AMS and was found to have a UTI.  HPI:   Per chart review, patient was admitted with AMS and was found to have a UTI. She received IV Rocephin. She was hospitalized on 4/15 for sepsis due to cellulitis. 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 She was hospitalized again from 5/2-5/6 for sepsis secondary to postoperative cellulitis of surgical wound and 5/15-5/16 for AMS. She was combative and required Haldol. Husband reported that she may have inadvertently took too many medications versus polypharmacy. On current admission, she endorses intermittent AVH. She has a history of anxiety and depression. She has been tearful so primary team is concerned for depression and psychiatry was consulted. There is a concern for polypharmacy. Home medications include Vraylar 4.5 mg qhs, Seroquel 300 mg qhs, Ambien 5 mg qhs PRN, (last prescription filled on 5/7), Gabapentin 600 mg BID and Klonopin 0.5 mg daily PRN (last prescription filled on 3/13). She is also prescribed Oxycodone. Her medications are prescribed by Dr. Yves Dill Little.   On interview, [name] is oriented to person, place and time.  She reports that she is currently managing the best that she can.  She reports chronic pain and frequently asks for pain medications throughout the interview.  She reports a history of depression  and anxiety.  She sees Noemi Chapel, NP at [facility].  Her last appointment was a week ago.  She reports her mood has been okay but she does endorse some anxiety secondary to marital strain.  She reports that she and her husband argue often.  They have been to couples counseling in the past.  She denies SI, HI or AVH.  She does report yesterday that she thought she heard her deceased mother in the bathroom.  She reports a lack of a social support system and she is estranged from her family.  She reports, "they do not have the time to put into a relationship."  She denies problems with her sleep or appetite.  She denies a history of manic symptoms (decreased need for sleep, increased energy or pressured speech).  She reports problems with her memory since her stroke.  Her husband manages her medications.  Past Psychiatric History: Depression and anxiety   Risk to Self:  None. Denies SI. Risk to Others:  None. Denies HI. Prior Inpatient Therapy:  She was hospitalized at 52 y/o for anxiety in the setting of pregnancy.  Prior Outpatient Therapy:  Her medications are managed by Noemi Chapel, NP.   Past Medical History:  Past Medical History:  Diagnosis Date  . Anxiety   . Arthritis   . Bipolar disorder ([place])   . Broken jaw ([place])   . Broken wrist   . Chronic back pain   . Claudication ([place])    a. 12/2013 ABI's: R 0.97, L 0.94.  [name] COPD (chronic obstructive pulmonary disease) ([place])   . Depression   . Diabetic Charcot's foot ([place])   . Diabetic foot ulcer ([facility])    chronic left foot ulcer , great toe/  hx recurrent foot ulcer bilaterally  . DJD (degenerative joint disease)   . Edema of both lower extremities   . Episode of memory loss   . GERD (gastroesophageal reflux disease)   . Headache(784.0)   . Hiatal hernia   . History of colon cancer, stage I dx 02-26-2015--- oncologist-- dr [name]--- per last note no recurrence   05-16-2015 s/p  Laparoscopic sigmoid colectomy w/  node bx's (negative nodes per path)  Stage I (pT1,N0,M0) Grade 2  . 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   per MRI multiple small strokes post cardiac cath. --  residual mild memory loss  . History of methicillin resistant staphylococcus aureus (MRSA)   . Hypercholesterolemia   . Insomnia   . Insulin dependent type 2 diabetes mellitus, uncontrolled ([place]) dx 2004   endocrinologist-  dr Dwyane Dee-- last A1c 8.8 in Aug2018:  pt is noncompliant w/ diet, states does note eat breakfast , her first main meal in afternoon  . Neurogenic bladder   . Neuropathy, diabetic ([place])    hands and feet  . OSA (obstructive sleep apnea)    cpap intolerant  . Personality disorder ([place])   . Restless leg syndrome   . SOB (shortness of breath) on exertion   . Stroke ([place])   . SUI (stress urinary incontinence, female) S/P SLING 12-29-2011    Past Surgical History:  Procedure Laterality Date  . ABDOMINAL HYSTERECTOMY    . AMPUTATION Right 04/13/2018   Procedure: RIGHT BELOW KNEE AMPUTATION;  Surgeon: Newt Minion, MD;  Location: [facility];  Service: Orthopedics;  Laterality: Right;  . ANTERIOR CERVICAL DECOMP/DISCECTOMY FUSION  2000   C5 - 7  . CARDIAC CATHETERIZATION  05-22-2008   DR SKAINS   NO SIGNIFECANT CAD/ NORMAL LV/  EF 65%/  NO WALL MOTION ABNORMALITIES  . CARPAL TUNNEL RELEASE Right 04-25-2013  . [place]; 1992  . COLONOSCOPY    . CYSTO N/A 04/30/2013   Procedure: CYSTOSCOPY;  Surgeon: Reece Packer, MD;  [facility];  Service: Urology;  Laterality: N/A;  . CYSTOSCOPY MACROPLASTIQUE IMPLANT N/A 02/06/2018   Procedure: CYSTOSCOPY MACROPLASTIQUE IMPLANT;  Surgeon: Bjorn Loser, MD;  Location[facility];  Service: Urology;  Laterality: N/A;  . CYSTOSCOPY WITH INJECTION  05/04/2012   Procedure: CYSTOSCOPY WITH INJECTION;  Surgeon: Reece Packer, MD;  Location: [facility];  Service: Urology;  Laterality: N/A;   MACROPLASTIQUE INJECTION  . CYSTOSCOPY WITH INJECTION  08/28/2012   Procedure: CYSTOSCOPY WITH INJECTION;  Surgeon: Reece Packer, MD;  Location: [facility];  Service: Urology;  Laterality: N/A;  cysto and macroplastique   . FOOT SURGERY Bilateral   . HERNIA REPAIR  ?1996   "stomach"  . INCISION AND DRAINAGE OF WOUND Right 04/11/2018   Procedure: IRRIGATION AND DEBRIDEMENT WOUND right foot and right ankle;  Surgeon: Evelina Bucy, DPM;  [facility];  Service: Podiatry;  Laterality: Right;  . KNEE ARTHROSCOPY W/ ALLOGRAFT IMPANT Left    graft x 2  . KNEE SURGERY     TOTAL 8 SURG'S  . LAPAROSCOPIC SIGMOID COLECTOMY N/A 04/22/2015   Procedure: LAPAROSCOPIC HAND ASSISTED SIGMOID COLECTOMY;  Surgeon: Erroll Luna, MD;  Location: [facility];  Service: General;  Laterality: N/A;  . LEFT HEART CATHETERIZATION WITH CORONARY ANGIOGRAM N/A 02/12/2014   Procedure: LEFT HEART CATHETERIZATION WITH CORONARY ANGIOGRAM;  Surgeon: Candee Furbish, MD;  Location: [facility];  Service: Cardiovascular;  Laterality: N/A;   No angiographically significant CAD; normal LVSF, LVEDP 36mHg,  EF 55% (new finding ef 30% myoview 01-08-2014)  . LUMBAR FUSION    . MANDIBLE FRACTURE SURGERY    . MULTIPLE LAPAROSCOPIES FOR ENDOMETRIOSIS    . PUBOVAGINAL SLING  12/29/2011   Procedure: PGaynelle Arabian  Surgeon: SReece Packer MD;  [facility]  Service: Urology;  Laterality: N/A;  cysto and sparc sling   . PUBOVAGINAL SLING N/A 04/30/2013   Procedure: REMOVAL OF VAGINAL MESH;  Surgeon: SReece Packer MD;  [facility];  Service: Urology;  Laterality: N/A;  . RECONSTURCTION OF CONGENITAL UTERUS ANOMALY  1983  . REPEAT RECONSTRUCTION ACL LEFT KNEE/ SCREWS REMOVED  03-28-2000   CADAVER GRAFT  . TOTAL ABDOMINAL HYSTERECTOMY W/ BILATERAL SALPINGOOPHORECTOMY  1997  . TRANSTHORACIC ECHOCARDIOGRAM  02/13/2014   ef 45%, hypokinesis base inferior and base inferolateral walls  .  UPPER GI ENDOSCOPY    . WOUND DEBRIDEMENT Right 09/05/2016   Procedure: DEBRIDEMENT WOUND WITH GRAFT RIGHT FOOT;  Surgeon: TLandis Martins DPM;  [facility]  Service: Podiatry;  Laterality: Right;  . WRIST FRACTURE SURGERY     Family History:  Family History  Problem Relation Age of Onset  . Hypertension Mother   . Diabetes Mother   . Cancer - Other Mother        lymphoma   . Cancer - Other Father        lung, bladder cancer   . Heart attack Father   . Cancer - Other Brother  bladder cancer    Family Psychiatric  History: Fraternal cousin-depression and fraternal aunt-depression.   Social History:  Social History   Substance and Sexual Activity  Alcohol Use No  . Alcohol/week: 0.0 oz     Social History   Substance and Sexual Activity  Drug Use No    Social History   Socioeconomic History  . Marital status: Married    Spouse name: Not on file  . Number of children: 2  . Years of education: 6th  . Highest education level: Not on file  Occupational History  . Not on file  Social Needs  . Financial resource strain: Not on file  . Food insecurity:    Worry: Not on file    Inability: Not on file  . Transportation needs:    Medical: Not on file    Non-medical: Not on file  Tobacco Use  . Smoking status: Current Every Day Smoker    Packs/day: 4.00    Years: 45.00    Pack years: 180.00    Types: Cigarettes  . Smokeless tobacco: Never Used  . Tobacco comment: per pt started smoking  age 72  Substance and Sexual Activity  . Alcohol use: No    Alcohol/week: 0.0 oz  . Drug use: No  . Sexual activity: Yes  Lifestyle  . Physical activity:    Days per week: Not on file    Minutes per session: Not on file  . Stress: Not on file  Relationships  . Social connections:    Talks on phone: Not on file    Gets together: Not on file    Attends religious service: Not on file    Active member of club or organization: Not on file    Attends meetings of clubs or  organizations: Not on file    Relationship status: Not on file  Other Topics Concern  . Not on file  Social History Narrative   Patient is divorced and lives alone- independent in ADLs, Drives   Patient has two adult children- grown son and daughter who is special needs in a home   Patient is disabled since 27   Patient has a 6th grade education.   Patient is right-handed.   Depression-medication and therapist   Patient drinks 2- 3 liters of soda and drinks tea but not everyday.    Does not routinely exercise.      Patient reports that she drinks "a lot" of caffeine daily    Additional Social History: She lives at home with her husband. They have been together for 20 years and recently got married in October 2018. She has a 66 y/o son and 33 y/o daughter that are not his children. She receives disability since 1995. She was previously a Administrator. She denies alcohol or illicit substance use.     Allergies:   Allergies  Allergen Reactions  . Latex Itching, Rash and Other (See Comments)    Pt states she cannot use condoms - cause an infection.  Use of latex on skin is okay.  Tape causes rash  . Sweetening Enhancer [Flavoring Agent] Nausea And Vomiting and Other (See Comments)    HEADACHES  . Aspartame And Phenylalanine Nausea And Vomiting    HEADACHES  . Ibuprofen Other (See Comments)    HEADACHES  . Trazodone And Nefazodone Other (See Comments)    Hallucinations   . Triazolam Other (See Comments)    HALLUCINATIONS    Labs:  Results for orders placed  or performed during the hospital encounter of 05/22/18 (from the past 48 hour(s))  Comprehensive metabolic panel     Status: Abnormal   Collection Time: 05/22/18  7:45 PM  Result Value Ref Range   Sodium 138 135 - 145 mmol/L   Potassium 4.0 3.5 - 5.1 mmol/L   Chloride 98 (L) 101 - 111 mmol/L   CO2 27 22 - 32 mmol/L   Glucose, Bld 273 (H) 65 - 99 mg/dL   BUN 16 6 - 20 mg/dL   Creatinine, Ser 1.02 (H) 0.44 - 1.00 mg/dL    Calcium 9.6 8.9 - 10.3 mg/dL   Total Protein 7.5 6.5 - 8.1 g/dL   Albumin 3.8 3.5 - 5.0 g/dL   AST 19 15 - 41 U/L   ALT 16 14 - 54 U/L   Alkaline Phosphatase 107 38 - 126 U/L   Total Bilirubin 0.5 0.3 - 1.2 mg/dL   GFR calc non Af Amer >60 >60 mL/min   GFR calc Af Amer >60 >60 mL/min    Comment: (NOTE) The eGFR has been calculated using the CKD EPI equation. This calculation has not been validated in all clinical situations. eGFR's persistently <60 mL/min signify possible Chronic Kidney Disease.    Anion gap 13 5 - 15    Comment: Performed at [facility] [address], [place], [place] [address]  CBC with Differential     Status: Abnormal   Collection Time: 05/22/18  7:45 PM  Result Value Ref Range   WBC 12.5 (H) 4.0 - 10.5 K/uL   RBC 4.55 3.87 - 5.11 MIL/uL   Hemoglobin 13.8 12.0 - 15.0 g/dL   HCT 41.5 36.0 - 46.0 %   MCV 91.2 78.0 - 100.0 fL   MCH 30.3 26.0 - 34.0 pg   MCHC 33.3 30.0 - 36.0 g/dL   RDW 13.8 11.5 - 15.5 %   Platelets 223 150 - 400 K/uL   Neutrophils Relative % 73 %   Neutro Abs 9.1 (H) 1.7 - 7.7 K/uL   Lymphocytes Relative 20 %   Lymphs Abs 2.5 0.7 - 4.0 K/uL   Monocytes Relative 5 %   Monocytes Absolute 0.6 0.1 - 1.0 K/uL   Eosinophils Relative 1 %   Eosinophils Absolute 0.2 0.0 - 0.7 K/uL   Basophils Relative 1 %   Basophils Absolute 0.1 0.0 - 0.1 K/uL   Immature Granulocytes 0 %   Abs Immature Granulocytes 0.1 0.0 - 0.1 K/uL    Comment: Performed at [facility] [address] [place], [place] [address]  Ethanol     Status: None   Collection Time: 05/22/18  8:00 PM  Result Value Ref Range   Alcohol, Ethyl (B) <10 <10 mg/dL    Comment: (NOTE) Lowest detectable limit for serum alcohol is 10 mg/dL. For medical purposes only. Performed at [facility], [place] [address], [place], [place] [address]   I-Stat CG4 Lactic Acid, ED     Status: None   Collection Time: 05/22/18  8:14 PM  Result Value Ref Range   Lactic Acid, Venous  1.21 0.5 - 1.9 mmol/L  Ammonia     Status: None   Collection Time: 05/22/18  9:39 PM  Result Value Ref Range   Ammonia 11 9 - 35 umol/L    Comment: Performed at [facility], [place] [address], [place], [place] [address]  Basic metabolic panel     Status: Abnormal   Collection Time: 05/23/18  4:29 AM  Result Value Ref Range   Sodium 136 135 - 145 mmol/L   Potassium 3.8 3.5 - 5.1 mmol/L   Chloride 95 (L) 101 - 111 mmol/L   CO2 27 22 - 32 mmol/L   Glucose, Bld 260 (H) 65 - 99 mg/dL   BUN 16 6 - 20 mg/dL   Creatinine, Ser 0.98 0.44 - 1.00 mg/dL   Calcium 9.3 8.9 - 10.3 mg/dL   GFR calc non Af Amer >60 >60 mL/min   GFR calc Af Amer >60 >60 mL/min    Comment: (NOTE) The eGFR has been calculated using the CKD EPI equation. This calculation has not been validated in all clinical situations. eGFR's persistently <60 mL/min signify possible Chronic Kidney Disease.    Anion gap 14 5 - 15    Comment: Performed at [facility] [address] [place], [place] [address]  CBC     Status: Abnormal   Collection Time: 05/23/18  4:29 AM  Result Value Ref Range   WBC 12.0 (H) 4.0 - 10.5 K/uL   RBC 4.37 3.87 - 5.11 MIL/uL   Hemoglobin 13.1 12.0 - 15.0 g/dL   HCT 40.1 36.0 - 46.0 %   MCV 91.8 78.0 - 100.0 fL   MCH 30.0 26.0 - 34.0 pg   MCHC 32.7 30.0 - 36.0 g/dL   RDW 13.6 11.5 - 15.5 %   Platelets 230 150 - 400 K/uL    Comment: Performed at [facility], [place] [address] [place], [place] [address]  Urinalysis, Routine w reflex microscopic     Status: Abnormal   Collection Time: 05/23/18  5:13 AM  Result Value Ref Range   Color, Urine YELLOW YELLOW   APPearance CLOUDY (A) CLEAR   Specific Gravity, Urine 1.029 1.005 - 1.030   pH 5.0 5.0 - 8.0   Glucose, UA >=500 (A) NEGATIVE mg/dL   Hgb urine dipstick SMALL (A) NEGATIVE   Bilirubin Urine NEGATIVE NEGATIVE   Ketones, ur 20 (A) NEGATIVE mg/dL   Protein, ur 30 (A) NEGATIVE mg/dL   Nitrite NEGATIVE NEGATIVE   Leukocytes, UA  LARGE (A) NEGATIVE   RBC / HPF 21-50 0 - 5 RBC/hpf   WBC, UA >50 (H) 0 - 5 WBC/hpf   Bacteria, UA MANY (A) NONE SEEN   Squamous Epithelial / LPF 11-20 0 - 5   WBC Clumps PRESENT     Comment: Performed at [facility], [place] [address], [place],  [address]  Urine rapid drug screen (hosp performed)     Status: Abnormal   Collection Time: 05/23/18  5:13 AM  Result Value Ref Range   Opiates NONE DETECTED NONE DETECTED   Cocaine NONE DETECTED NONE DETECTED   Benzodiazepines POSITIVE (A) NONE DETECTED   Amphetamines NONE DETECTED NONE DETECTED   Tetrahydrocannabinol NONE DETECTED NONE DETECTED   Barbiturates NONE DETECTED NONE DETECTED    Comment: (NOTE) DRUG SCREEN FOR MEDICAL PURPOSES ONLY.  IF CONFIRMATION IS NEEDED FOR ANY PURPOSE, NOTIFY LAB WITHIN 5 DAYS. LOWEST DETECTABLE LIMITS FOR URINE DRUG SCREEN Drug Class                     Cutoff (ng/mL) Amphetamine and metabolites    1000 Barbiturate and metabolites    200 Benzodiazepine                 160 Tricyclics and metabolites     300 Opiates and metabolites  300 Cocaine and metabolites        300 THC                            50 Performed at [facility], [place] [address] [place], [place] [address]   CBG monitoring, ED     Status: Abnormal   Collection Time: 05/23/18  8:14 AM  Result Value Ref Range   Glucose-Capillary 286 (H) 65 - 99 mg/dL  CBG monitoring, ED     Status: Abnormal   Collection Time: 05/23/18 12:33 PM  Result Value Ref Range   Glucose-Capillary 282 (H) 65 - 99 mg/dL  Glucose, capillary     Status: Abnormal   Collection Time: 05/23/18  5:04 PM  Result Value Ref Range   Glucose-Capillary 199 (H) 65 - 99 mg/dL  Glucose, capillary     Status: Abnormal   Collection Time: 05/23/18  9:06 PM  Result Value Ref Range   Glucose-Capillary 174 (H) 65 - 99 mg/dL   Comment 1 Notify RN    Comment 2 Document in Chart   Glucose, capillary     Status: Abnormal   Collection Time: 05/24/18  8:09  AM  Result Value Ref Range   Glucose-Capillary 312 (H) 65 - 99 mg/dL  Glucose, capillary     Status: Abnormal   Collection Time: 05/24/18 12:28 PM  Result Value Ref Range   Glucose-Capillary 283 (H) 65 - 99 mg/dL    Current Facility-Administered Medications  Medication Dose Route Frequency Provider Last Rate Last Dose  . acetaminophen (TYLENOL) tablet 650 mg  650 mg Oral Q4H PRN Ivor Costa, MD   650 mg at 05/24/18 1257  . albuterol (PROVENTIL) (2.5 MG/3ML) 0.083% nebulizer solution 2.5 mg  2.5 mg Nebulization Q4H PRN Ivor Costa, MD      . bisacodyl (DULCOLAX) EC tablet 5 mg  5 mg Oral Daily PRN Ivor Costa, MD      . cefTRIAXone (ROCEPHIN) 1 g in sodium chloride 0.9 % 100 mL IVPB  1 g Intravenous Q24H Damita Lack, MD   Stopped at 05/24/18 0845  . docusate sodium (COLACE) capsule 100 mg  100 mg Oral BID Ivor Costa, MD   100 mg at 05/24/18 0858  . enoxaparin (LOVENOX) injection 40 mg  40 mg Subcutaneous Q24H Ivor Costa, MD   40 mg at 05/24/18 1304  . gabapentin (NEURONTIN) tablet 600 mg  600 mg Oral BID Ivor Costa, MD   600 mg at 05/24/18 0750  . hydrALAZINE (APRESOLINE) injection 5 mg  5 mg Intravenous Q2H PRN Ivor Costa, MD      . insulin aspart (novoLOG) injection 0-9 Units  0-9 Units Subcutaneous TID WC Ivor Costa, MD   5 Units at 05/24/18 1254  . insulin glargine (LANTUS) injection 24 Units  24 Units Subcutaneous QHS Ivor Costa, MD   24 Units at 05/23/18 2232  . lidocaine (LIDODERM) 5 % 1 patch  1 patch Transdermal Daily PRN Ivor Costa, MD      . nicotine (NICODERM CQ - dosed in mg/24 hours) patch 21 mg  21 mg Transdermal Daily Ivor Costa, MD   21 mg at 05/24/18 0858  . ondansetron (ZOFRAN) tablet 4 mg  4 mg Oral Q6H PRN Ivor Costa, MD       Or  . ondansetron [facility]) injection 4 mg  4 mg Intravenous Q6H PRN Ivor Costa, MD      . pantoprazole ([place])  EC tablet 40 mg  40 mg Oral Daily Ivor Costa, MD   40 mg at 05/24/18 0858  . pentoxifylline (TRENTAL) CR tablet 400 mg  400 mg  Oral TID WC Newt Minion, MD   400 mg at 05/24/18 1252    Musculoskeletal: Strength & Muscle Tone: within normal limits Gait & Station: unable to stand Patient leans: N/A  Psychiatric Specialty Exam: Physical Exam  Nursing note and vitals reviewed. Constitutional: She is oriented to person, place, and time. She appears well-developed and well-nourished.  HENT:  Head: Normocephalic and atraumatic.  Neck: Normal range of motion.  Respiratory: Effort normal.  Musculoskeletal: Normal range of motion.  Neurological: She is alert and oriented to person, place, and time.  Skin: No rash noted.  Psychiatric: She has a normal mood and affect. Her speech is normal and behavior is normal. Judgment and thought content normal. Cognition and memory are normal.    Review of Systems  Constitutional: Negative for chills and fever.  Cardiovascular: Negative for chest pain.  Gastrointestinal: Positive for abdominal pain. Negative for constipation, diarrhea, nausea and vomiting.  Musculoskeletal: Positive for joint pain and neck pain.       Chronic pain  Psychiatric/Behavioral: Negative for depression, hallucinations, substance abuse and suicidal ideas. The patient does not have insomnia.   All other systems reviewed and are negative.   Blood pressure (!) 141/78, pulse 91, temperature 98.4 F (36.9 C), temperature source Oral, resp. rate 18, height _0  (1.651 m), weight 94.7 kg (208 lb 12.4 oz), SpO2 100 %.Body mass index is 34.74 kg/m.  General Appearance: Fairly Groomed, middle aged, Caucasian female, wearing a hospital gown with long, blond hair and right BKA who is lying in bed. NAD.   Eye Contact:  Good  Speech:  Clear and Coherent and Normal Rate  Volume:  Normal  Mood:  "Okay"  Affect:  Appropriate and Full Range although tearful when speaking about her mother.   Thought Process:  Goal Directed, Linear and Descriptions of Associations: Intact  Orientation:  Full (Time, Place, and  Person)  Thought Content:  Logical  Suicidal Thoughts:  No  Homicidal Thoughts:  No  Memory:  Immediate;   Good Recent;   Good Remote;   Good  Judgement:  Fair  Insight:  Fair  Psychomotor Activity:  Normal  Concentration:  Concentration: Good and Attention Span: Good  Recall:  Good  Fund of Knowledge:  Good  Language:  Good  Akathisia:  No  Handed:  Right  AIMS (if indicated):   N/A  Assets:  Communication Skills Housing Intimacy  ADL's:  Intact  Cognition:  WNL  Sleep:   Okay   Assessment:  ELFA WOOTON is a 52 y.o. female who was admitted with AMS and found to have a UTI. Psychiatry was consulted for medication management since there is also concern for polypharmacy contributing to mental status change. She was alert and oriented today. She was appropriate in behavior and organized in thought process. She denies SI, HI or AVH and does not appear to be responding to internal stimuli. Patient reports that she recently started Vraylar and Seroquel. Recommend restarting Seroquel at half home dose for anxiety, insomnia and mood stabilization and holding Vraylar since there is no indication for using 2 concurrent antipsychotics. Patient should follow up with her outpatient provider for further medication management.   Treatment Plan Summary: -Restart Seroquel 150 mg qhs for insomnia and mood stabilization. -Hold Klonopin (monitor for withdrawal but last  filled in March so likely infrequent use), Vraylar and Ambien given concern for polypharmacy.  -Patient should follow up with her outpatient provider for further medication management.  -Psychiatry will sign off on patient at this time. Please consult psychiatry again as needed.   Disposition: No evidence of imminent risk to self or others at present.   Patient does not meet criteria for psychiatric inpatient admission.  Faythe Dingwall, DO 05/24/2018 2:14 PM

## 2018-05-24 NOTE — Progress Notes (Signed)
PROGRESS NOTE    [name]  SHF:[id number] DOB: 05-20-1966 DOA: 05/22/2018 PCP: Tamsen Roers, MD   Brief Narrative:  52 year old female with history of essential hypertension, hyperlipidemia, diabetes mellitus, COPD, stroke, depression, obstructive sleep apnea not on CPAP, restless leg syndrome, stage I colon cancer status post colectomy, right transtibial amputation was admitted to the hospital for change in mental status.  Initially was thought that it was secondary to polypharmacy but eventually was also found to have urinary tract infection.  She was started on IV Rocephin.  She was evaluated orthopedic due to concerns of dehiscence and edema with slight swelling/cellulitis of the right transtibial amputation area.  Orthopedic recommended stump shrinker, Trental to help with circulation and follow-up in 1 week.  Patient also had some auditory/visual hallucination and was very teary therefore psychiatry consult was also placed due to concerns of depression..   Assessment & Plan:   Principal Problem:   Acute metabolic encephalopathy Active Problems:   Type II diabetes mellitus, uncontrolled (HCC)   Leukocytosis   Depression with anxiety   GERD (gastroesophageal reflux disease)   Tobacco abuse   UTI (urinary tract infection)  Acute metabolic encephalopathy, improving but not yet at baseline AAOx2 (baseline AAOx4) Auditory visual hallucination, intermittent Urinary tract infection without hematuria Right transtibial area cellulitis with some wound dehiscence - At this time we will continue the patient on IV Rocephin, will eventually change her to oral antibiotic when appropriate - Local wound care.  Appreciate orthopedic input-stumps drinker, tramadol ordered.  Follow-up in 1 week with Dr. Sharol Given - We will get psychiatry evaluation as well as there is concerns for polypharmacy, depression-will help with reconciling her psychiatry medications. -Provide supportive care  Uncontrolled  diabetes mellitus type 2 -Continue home insulin and sliding scale  Depression with anxiety -Medications are on hold -Have consulted psychiatry to assist with psychiatric medications especially in the setting of overdose  GERD -Protonix  Nicotine abuse - Counseled to quit using nicotine.  Will prescribe patch.   DVT prophylaxis: Lovenox Code Status: Full code Family Communication:  None at bedside  Disposition Plan: TBD, likely discharge in next 2 days once her mentation is better.   It is my clinical opinion that admission to INPATIENT is reasonable and necessary in this 52 y.o. female . presenting with symptoms of altered mental status, concerning for underlying infection with concerns for depression or underlying psychiatry condition . in the context of PMH including: PTSD, GERD, diabetes mellitus type 2 . with pertinent positives on physical exam including: Swelling and erythema of the right stump.  Having intermittent auditory and visual hallucinations. . Workup and treatment include IV antibiotics, psychiatry evaluation  Given the aforementioned, the predictability of an adverse outcome is felt to be significant. I expect that the patient will require at least 2 midnights in the hospital to treat this condition.  Consultants:   Orthopedic  Psychiatry  Procedures:   None  Antimicrobials:   Rocephin 5/29 >   Subjective: Patient is overall feeling better but still having intermittent confusion with auditory and visual hallucination.  She is stating that there is a dog sitting behind her and her mother is in bathroom.  Review of Systems Otherwise negative except as per HPI, including: General: Denies fever, chills, night sweats or unintended weight loss. Resp: Denies cough, wheezing, shortness of breath. Cardiac: Denies chest pain, palpitations, orthopnea, paroxysmal nocturnal dyspnea. GI: Denies abdominal pain, nausea, vomiting, diarrhea or constipation GU: Denies  dysuria, frequency, hesitancy or incontinence MS:  Denies muscle aches, joint pain or swelling Neuro: Denies headache, neurologic deficits (focal weakness, numbness, tingling), abnormal gait Psych: Denies anxiety, depression, SI/HI Skin: Denies new rashes or lesions ID: Denies sick contacts, exotic exposures, travel  Objective: Vitals:   05/23/18 1453 05/23/18 2100 05/24/18 0531 05/24/18 0942  BP: 117/68 131/74 138/81   Pulse: 89 84 91   Resp: 18 18 18    Temp: 98.4 F (36.9 C) 97.6 F (36.4 C) 98.2 F (36.8 C)   TempSrc: Oral Oral Oral   SpO2: 98% 93% 95%   Weight: 94.7 kg (208 lb 12.4 oz)     Height:    5\' 5"  (1.651 m)    Intake/Output Summary (Last 24 hours) at 05/24/2018 1303 Last data filed at 05/24/2018 1100 Gross per 24 hour  Intake 1080 ml  Output -  Net 1080 ml   Filed Weights   05/23/18 1453  Weight: 94.7 kg (208 lb 12.4 oz)    Examination:  General exam: Appears calm and comfortable  Respiratory system: Clear to auscultation. Respiratory effort normal. Cardiovascular system: S1 & S2 heard, RRR. No JVD, murmurs, rubs, gallops or clicks. No pedal edema. Gastrointestinal system: Abdomen is nondistended, soft and nontender. No organomegaly or masses felt. Normal bowel sounds heard. Central nervous system: Alert and oriented. No focal neurological deficits. AAOx2 (name and place) Extremities: Symmetric 5 x 5 power. Skin: Right lower extremity transtibial stump slightly erythematous with slight dehiscence of her wound. Psychiatry: Having auditory and visual hallucination    Data Reviewed:   CBC: Recent Labs  Lab 05/22/18 1945 05/23/18 0429  WBC 12.5* 12.0*  NEUTROABS 9.1*  --   HGB 13.8 13.1  HCT 41.5 40.1  MCV 91.2 91.8  PLT 223 622   Basic Metabolic Panel: Recent Labs  Lab 05/22/18 1945 05/23/18 0429  NA 138 136  K 4.0 3.8  CL 98* 95*  CO2 27 27  GLUCOSE 273* 260*  BUN 16 16  CREATININE 1.02* 0.98  CALCIUM 9.6 9.3   GFR: Estimated  Creatinine Clearance: 75.6 mL/min (by C-G formula based on SCr of 0.98 mg/dL). Liver Function Tests: Recent Labs  Lab 05/22/18 1945  AST 19  ALT 16  ALKPHOS 107  BILITOT 0.5  PROT 7.5  ALBUMIN 3.8   No results for input(s): LIPASE, AMYLASE in the last 168 hours. Recent Labs  Lab 05/22/18 2139  AMMONIA 11   Coagulation Profile: No results for input(s): INR, PROTIME in the last 168 hours. Cardiac Enzymes: No results for input(s): CKTOTAL, CKMB, CKMBINDEX, TROPONINI in the last 168 hours. BNP (last 3 results) No results for input(s): PROBNP in the last 8760 hours. HbA1C: No results for input(s): HGBA1C in the last 72 hours. CBG: Recent Labs  Lab 05/23/18 0814 05/23/18 1233 05/23/18 1704 05/23/18 2106 05/24/18 0809  GLUCAP 286* 282* 199* 174* 312*   Lipid Profile: No results for input(s): CHOL, HDL, LDLCALC, TRIG, CHOLHDL, LDLDIRECT in the last 72 hours. Thyroid Function Tests: No results for input(s): TSH, T4TOTAL, FREET4, T3FREE, THYROIDAB in the last 72 hours. Anemia Panel: No results for input(s): VITAMINB12, FOLATE, FERRITIN, TIBC, IRON, RETICCTPCT in the last 72 hours. Sepsis Labs: Recent Labs  Lab 05/22/18 2014  LATICACIDVEN 1.21    No results found for this or any previous visit (from the past 240 hour(s)).       Radiology Studies: Dg Chest 2 View  Result Date: 05/22/2018 CLINICAL DATA:  Shortness of breath, central chest pain EXAM: CHEST - 2 VIEW COMPARISON:  05/09/2018 FINDINGS: Lungs are clear.  No pleural effusion or pneumothorax. The heart is normal in size. Mild degenerative changes of the visualized thoracolumbar spine. Cervical spine fixation hardware. IMPRESSION: Normal chest radiographs. Electronically Signed   By: Julian Hy M.D.   On: 05/22/2018 21:24   Ct Head Wo Contrast  Result Date: 05/22/2018 CLINICAL DATA:  Status post fall. Acute onset of altered mental status. EXAM: CT HEAD WITHOUT CONTRAST TECHNIQUE: Contiguous axial images  were obtained from the base of the skull through the vertex without intravenous contrast. COMPARISON:  CT of the head performed 05/09/2018 FINDINGS: Brain: No evidence of acute infarction, hemorrhage, hydrocephalus, extra-axial collection or mass lesion/mass effect. The posterior fossa, including the cerebellum, brainstem and fourth ventricle, is within normal limits. The third and lateral ventricles, and basal ganglia are unremarkable in appearance. The cerebral hemispheres are symmetric in appearance, with normal gray-white differentiation. No mass effect or midline shift is seen. Vascular: No hyperdense vessel or unexpected calcification. Skull: There is no evidence of fracture; visualized osseous structures are unremarkable in appearance. Sinuses/Orbits: The visualized portions of the orbits are within normal limits. The paranasal sinuses and mastoid air cells are well-aerated. Other: No significant soft tissue abnormalities are seen. IMPRESSION: No evidence of traumatic intracranial injury or fracture. Electronically Signed   By: Garald Balding M.D.   On: 05/22/2018 23:30        Scheduled Meds: . docusate sodium  100 mg Oral BID  . enoxaparin (LOVENOX) injection  40 mg Subcutaneous Q24H  . gabapentin  600 mg Oral BID  . insulin aspart  0-9 Units Subcutaneous TID WC  . insulin glargine  24 Units Subcutaneous QHS  . nicotine  21 mg Transdermal Daily  . pantoprazole  40 mg Oral Daily  . pentoxifylline  400 mg Oral TID WC   Continuous Infusions: . cefTRIAXone (ROCEPHIN)  IV Stopped (05/24/18 0845)     LOS: 0 days    I have spent 35 minutes face to face with the patient and on the ward discussing the patients care, assessment, plan and disposition with other care givers. >50% of the time was devoted counseling the patient about the risks and benefits of treatment and coordinating care.     Ankit Arsenio Loader, MD [facility] Pager [phone]   If 7PM-7AM, please contact  night-coverage [email or web address] Password [email or web address] 05/24/2018, 1:03 PM

## 2018-05-24 NOTE — Progress Notes (Addendum)
Inpatient Diabetes Program Recommendations  AACE/ADA: New Consensus Statement on Inpatient Glycemic Control (2015)  Target Ranges:  Prepandial:   less than 140 mg/dL      Peak postprandial:   less than 180 mg/dL (1-2 hours)      Critically ill patients:  140 - 180 mg/dL   Lab Results  Component Value Date   GLUCAP 283 (H) 05/24/2018   HGBA1C 9.2 03/19/2018    Review of Glycemic Control Results for BETUL, BRISKY (MRN [id number]) as of 05/24/2018 16:06  Ref. Range 05/23/2018 17:04 05/23/2018 21:06 05/24/2018 08:09 05/24/2018 12:28  Glucose-Capillary Latest Ref Range: 65 - 99 mg/dL 199 (H) 174 (H) 312 (H) 283 (H)   Diabetes history: Type 2 DM Outpatient Diabetes medications: Basaglar 36 units QHS, Novolog 10-35 untis TID, Jardiance 25 mg QD Current orders for Inpatient glycemic control: Lantus 24 units QHS, Novolog 0-9 units TID  Inpatient Diabetes Recommendations:  Consider increasing patient's Lantus to 30 units QHS, add Novolog 0-5 units QHS, increase correction to Novolog 0-15 units TID and add Novolog 3 units TID (assuming patient consumes >50%).   Spoke with patient regarding home regimen. Patient's main concern was increasing Klonopin dosing. Educated patient of my role and encouraged her to voice concerns during psych consult and with additional rounding physicians. Patient unable to verbalize home dosages.   Per Dr Ronnie Derby note on 03/19/18" "She has been treated mostly with insulin since about a year after diagnosis. She has had difficulty with consistent compliance with diet and also compliance with self care including glucose monitoring over the years. "  Reviewed patient's current A1c of 9.2%. Explained what a A1c is and what it measures. Also reviewed goal A1c with patient, importance of good glucose control @ home, and blood sugar goals. Reviewed the benefits of lifestyle changes that come with diet and being more active. Educated on basic patho of DM and need for insulin.   Patient's husband checks BS 4x a day. However, patient says that she uses Freestyle lite meter, which has expensive test strips. Will need a meter at discharge. Blood glucose meter kit (includes lancets and strips) ([id number]). Encourage patient towards lifestyle goal setting and to follow up with Dr Dwyane Dee. Educated patient on how to read food labels but patient unable to see label d/t vision. Will place dietitian consult.  Thanks, Bronson Curb, MSN, RNC-OB Diabetes Coordinator [phone] (8a-5p)

## 2018-05-24 NOTE — Progress Notes (Addendum)
Patient called to use bathroom. Nurse informed patient she has to use the bedside commode. Patient declined to use the bedside commode and informed nurse she has been walking with a walker to the bathroom since she got admitted. Patient assisted to bathroom with a walker. On our way to the bathroom patient could not hold on to the walker and her right leg slid. Nurse gently  assisted patient to the floor in front of bathroom door.    Patient did not not hit her head. Vital signs taken and NP K. Schorr notified.

## 2018-05-24 NOTE — Progress Notes (Signed)
Patient requesting to take sleeping medication. Paged Lamar Blinks, NP. Awaiting orders.

## 2018-05-24 NOTE — Progress Notes (Signed)
Orthopedic Tech Progress Note Patient Details:  [name] 1966-11-19 [id number]  Patient ID: [name], female   DOB: Aug 28, 1966, 52 y.o.   MRN: [id number]   Hildred Priest 05/24/2018, 9:51 AM Called in bio-tech brace order; spoke with Bella Kennedy

## 2018-05-25 DIAGNOSIS — F419 Anxiety disorder, unspecified: Secondary | ICD-10-CM

## 2018-05-25 LAB — GLUCOSE, CAPILLARY: GLUCOSE-CAPILLARY: 183 mg/dL — AB (ref 65–99)

## 2018-05-25 MED ORDER — QUETIAPINE FUMARATE 50 MG PO TABS: 150.0000 mg | ORAL_TABLET | Freq: Every day | ORAL | 0 refills | Status: DC

## 2018-05-25 MED ORDER — CEPHALEXIN 500 MG PO CAPS: 500.0000 mg | ORAL_CAPSULE | Freq: Four times a day (QID) | ORAL | 0 refills | Status: AC

## 2018-05-25 NOTE — Progress Notes (Signed)
Inpatient Diabetes Program Recommendations  AACE/ADA: New Consensus Statement on Inpatient Glycemic Control (2015)  Target Ranges:  Prepandial:   less than 140 mg/dL      Peak postprandial:   less than 180 mg/dL (1-2 hours)      Critically ill patients:  140 - 180 mg/dL   Lab Results  Component Value Date   GLUCAP 183 (H) 05/25/2018   HGBA1C 9.2 03/19/2018    Review of Glycemic Control Results for [name], [name] (MRN [id number]) as of 05/25/2018 09:26  Ref. Range 05/24/2018 12:28 05/24/2018 17:21 05/24/2018 20:45 05/25/2018 07:27  Glucose-Capillary Latest Ref Range: 65 - 99 mg/dL 283 (H) 337 (H) 204 (H) 183 (H)   Diabetes history: Type 2 DM Outpatient Diabetes medications: Basaglar 36 units QHS, Novolog 10-35 untis TID, Jardiance 25 mg QD Current orders for Inpatient glycemic control: Lantus 24 units QHS, Novolog 0-9 units TID  Inpatient Diabetes Recommendations:  If to remain inpatient, consider increasing patient's Lantus to 30 units QHS, add Novolog 0-5 units QHS and add Novolog 3 units TID (assuming patient consumes >50%).   Will need a meter at discharge. Blood glucose meter kit (includes lancets and strips) ([id number]).  Thanks, Bronson Curb, MSN, RNC-OB Diabetes Coordinator [phone] (8a-5p)

## 2018-05-25 NOTE — Progress Notes (Signed)
Pt for discharge today going home health teachings, next appointment and due meds explained and understood, discontinued peripheral IV line, no s/s of distress noted at this time, waiting for her family to pick her up.

## 2018-05-25 NOTE — Discharge Summary (Signed)
Physician Discharge Summary  [name] YWV:[id number] DOB: Nov 18, 1966 DOA: 05/22/2018  PCP: Tamsen Roers, MD  Admit date: 05/22/2018 Discharge date: 05/25/2018  Admitted From: Home Disposition: Home  Recommendations for Outpatient Follow-up:  1. Follow up with PCP in 1-2 weeks 2. Please obtain BMP/CBC in one week your next doctors visit.  3. Take Keflex orally as prescribed for 7 days 4. Stop taking Klonopin, Vraylar and Ambien 5. Seroquel has been decreased from 200 mg at bedtime 250 mg at bedtime 6. Needs to follow-up with outpatient psychiatry within the next 1-2 weeks  Home Health: None Equipment/Devices: None Discharge Condition: Stable CODE STATUS: Full Diet recommendation: Heart healthy  Brief/Interim Summary: 52 year old female with history of essential hypertension, hyperlipidemia, diabetes mellitus, COPD, stroke, depression, obstructive sleep apnea not on CPAP, restless leg syndrome, stage I colon cancer status post colectomy, right transtibial amputation was admitted to the hospital for change in mental status.  Initially was thought that it was secondary to polypharmacy but eventually was also found to have urinary tract infection.  She was started on IV Rocephin.  She was evaluated orthopedic due to concerns of dehiscence and edema with slight swelling/cellulitis of the right transtibial amputation area.  Orthopedic recommended stump shrinker, [place] to help with circulation and follow-up in 1 week.  Patient also had some auditory/visual hallucination and was very teary therefore psychiatry consult was also placed due to concerns of depression.  Psychiatry recommended decreasing patient's Seroquel from 200 mg daily 250 mg daily, stopping Ambien, Klonopin and Vraylar.  They also recommended following up with outpatient psych.  During their evaluation patient was mentating well.     Discharge Diagnoses:  Principal Problem:   Anxiety Active Problems:   Type II diabetes  mellitus, uncontrolled (HCC)   Leukocytosis   GERD (gastroesophageal reflux disease)   Tobacco abuse   Acute metabolic encephalopathy   UTI (urinary tract infection)  Acute metabolic encephalopathy, resolved  auditory visual hallucination, resolved Urinary tract infection without hematuria, improving Right transtibial area cellulitis with some wound dehiscence - We will change patient's antibiotic to oral Keflex to be taken for 7 days  - Evaluated by Dr. Sharol Given from orthopedic who recommended stump shrinker and following up outpatient in 1 week.  Arrangements were made. - Psychiatry recommended only continuing Seroquel at a lower dose of 150 mg orally daily.  Following up with outpatient psychiatry. -Provide supportive care  Uncontrolled diabetes mellitus type 2 -Resume home diabetic regimen  Depression with anxiety -Only Seroquel 150 mg at bedtime.  Follow-up with outpatient psychiatry.  Appreciate psych input.  GERD -Protonix  Tobacco abuse - Counseled to quit using tobacco    Discharge Instructions   Allergies as of 05/25/2018      Reactions   Latex Itching, Rash, Other (See Comments)   Pt states she cannot use condoms - cause an infection.  Use of latex on skin is okay.  Tape causes rash   Sweetening Enhancer [flavoring Agent] Nausea And Vomiting, Other (See Comments)   HEADACHES   Aspartame And Phenylalanine Nausea And Vomiting   HEADACHES   Ibuprofen Other (See Comments)   HEADACHES   Trazodone And Nefazodone Other (See Comments)   Hallucinations   Triazolam Other (See Comments)   HALLUCINATIONS      Medication List    STOP taking these medications   clonazePAM 0.5 MG tablet Commonly known as:  KLONOPIN   VRAYLAR 4.5 MG Caps Generic drug:  Cariprazine HCl   zolpidem 5 MG tablet Commonly  known as:  AMBIEN     TAKE these medications   aspirin-acetaminophen-caffeine 250-250-65 MG tablet Commonly known as:  EXCEDRIN MIGRAINE Take 2 tablets by mouth  every 6 (six) hours as needed for headache.   BASAGLAR KWIKPEN 100 UNIT/ML Sopn Inject 36 Units into the skin at bedtime.   bisacodyl 5 MG EC tablet Commonly known as:  DULCOLAX Take 1 tablet (5 mg total) by mouth daily as needed for moderate constipation.   cephALEXin 500 MG capsule Commonly known as:  KEFLEX Take 1 capsule (500 mg total) by mouth 4 (four) times daily for 7 days.   cyclobenzaprine 10 MG tablet Commonly known as:  FLEXERIL Take 10 mg by mouth 3 (three) times daily.   gabapentin 600 MG tablet Commonly known as:  NEURONTIN Take 1 tablet (600 mg total) by mouth 2 (two) times daily. Take in the morning and afternoon. Do not take at night time.   HETLIOZ 20 MG Caps Generic drug:  Tasimelteon Take 20 mg by mouth at bedtime.   INGREZZA 80 MG Caps Generic drug:  Valbenazine Tosylate Take 80 mg by mouth at bedtime.   insulin aspart 100 UNIT/ML injection Commonly known as:  novoLOG Inject 10-35 Units into the skin 3 (three) times daily with meals. Take 10 units before your main meal. If blood sugars are consistently over 250 after the meal start taking 14 units before your main meal. 35 UNITS AT SUPPER   JARDIANCE 25 MG Tabs tablet Generic drug:  empagliflozin Take 25 mg by mouth daily.   lidocaine 5 % Commonly known as:  LIDODERM Place 1 patch onto the skin daily as needed (pain). Remove & Discard patch within 12 hours or as directed by MD   nortriptyline 10 MG capsule Commonly known as:  PAMELOR Take 10 mg by mouth at bedtime.   ondansetron 4 MG tablet Commonly known as:  ZOFRAN Take 1 tablet (4 mg total) by mouth every 6 (six) hours as needed for nausea.   Oxycodone HCl 10 MG Tabs Take 10 mg by mouth every 6 (six) hours as needed for severe pain.   oxyCODONE-acetaminophen 10-325 MG tablet Commonly known as:  PERCOCET Take 1 tablet by mouth every 8 (eight) hours as needed for pain.   pantoprazole 40 MG tablet Commonly known as:  PROTONIX Take 1  tablet (40 mg total) by mouth daily.   QUEtiapine 50 MG tablet Commonly known as:  SEROQUEL Take 3 tablets (150 mg total) by mouth at bedtime. What changed:    medication strength  how much to take   tamsulosin 0.4 MG Caps capsule Commonly known as:  FLOMAX Take 0.4 mg by mouth daily.      Follow-up Information    Newt Minion, MD In 1 week.   Specialty:  Orthopedic Surgery Contact information: [place] [address] [phone]        Tamsen Roers, MD. Schedule an appointment as soon as possible for a visit in 1 week(s).   Specialty:  Family Medicine Contact information: [address] [place] [address] [phone]          Allergies  Allergen Reactions  . Latex Itching, Rash and Other (See Comments)    Pt states she cannot use condoms - cause an infection.  Use of latex on skin is okay.  Tape causes rash  . Sweetening Enhancer [Flavoring Agent] Nausea And Vomiting and Other (See Comments)    HEADACHES  . Aspartame And Phenylalanine  Nausea And Vomiting    HEADACHES  . Ibuprofen Other (See Comments)    HEADACHES  . Trazodone And Nefazodone Other (See Comments)    Hallucinations   . Triazolam Other (See Comments)    HALLUCINATIONS    You were cared for by a hospitalist during your hospital stay. If you have any questions about your discharge medications or the care you received while you were in the hospital after you are discharged, you can call the unit and asked to speak with the hospitalist on call if the hospitalist that took care of you is not available. Once you are discharged, your primary care physician will handle any further medical issues. Please note that no refills for any discharge medications will be authorized once you are discharged, as it is imperative that you return to your primary care physician (or establish a relationship with a primary care physician if you do not have one) for your aftercare needs so that  they can reassess your need for medications and monitor your lab values.  Consultations:  Orthopedic  Psychiatry   Procedures/Studies: Dg Chest 2 View  Result Date: 05/22/2018 CLINICAL DATA:  Shortness of breath, central chest pain EXAM: CHEST - 2 VIEW COMPARISON:  05/09/2018 FINDINGS: Lungs are clear.  No pleural effusion or pneumothorax. The heart is normal in size. Mild degenerative changes of the visualized thoracolumbar spine. Cervical spine fixation hardware. IMPRESSION: Normal chest radiographs. Electronically Signed   By: Julian Hy M.D.   On: 05/22/2018 21:24   Dg Chest 2 View  Result Date: 05/09/2018 CLINICAL DATA:  Altered mental status for 2 days. EXAM: CHEST - 2 VIEW COMPARISON:  Chest x-rays dated 04/26/2018 and 09/09/2017. FINDINGS: Heart size and mediastinal contours are stable. Probable chronic bronchitic changes within the perihilar regions. Lungs otherwise clear, although characterization somewhat limited by patient motion artifact. No pleural effusion or pneumothorax seen. No acute or suspicious osseous finding. IMPRESSION: 1. No active cardiopulmonary disease. No evidence of pneumonia or pulmonary edema. 2. Probable chronic bronchitic changes. Electronically Signed   By: Franki Cabot M.D.   On: 05/09/2018 18:02   Dg Chest 2 View  Result Date: 04/26/2018 CLINICAL DATA:  52 year old female with fever status post recent amputation EXAM: CHEST - 2 VIEW COMPARISON:  Prior chest x-ray 04/09/2018 FINDINGS: The lungs are clear and negative for focal airspace consolidation, pulmonary edema or suspicious pulmonary nodule. No pleural effusion or pneumothorax. Cardiac and mediastinal contours are within normal limits. No acute fracture or lytic or blastic osseous lesions. The visualized upper abdominal bowel gas pattern is unremarkable. Incompletely imaged anterior cervical fusion hardware. IMPRESSION: No active cardiopulmonary disease. Electronically Signed   By: Jacqulynn Cadet M.D.   On: 04/26/2018 21:14   Dg Tibia/fibula Right  Result Date: 04/26/2018 CLINICAL DATA:  52 year old female with fever. Recent right below-the-knee amputation EXAM: RIGHT TIBIA AND FIBULA - 2 VIEW COMPARISON:  None. FINDINGS: Surgical changes of below the knee amputation. Surgical staples are present. The osteotomy sites are well-marginated. No evidence of subcutaneous emphysema. No conventional radiographic evidence of osteomyelitis. Mild soft tissue edema at the stump, not unexpected postoperative period. IMPRESSION: 1. Surgical changes of right below-the-knee amputation with expected postoperative edema. 2. No evidence of subcutaneous emphysema or unexpected bony changes. Electronically Signed   By: Jacqulynn Cadet M.D.   On: 04/26/2018 21:13   Ct Head Wo Contrast  Result Date: 05/22/2018 CLINICAL DATA:  Status post fall. Acute onset of altered mental status. EXAM: CT HEAD WITHOUT  CONTRAST TECHNIQUE: Contiguous axial images were obtained from the base of the skull through the vertex without intravenous contrast. COMPARISON:  CT of the head performed 05/09/2018 FINDINGS: Brain: No evidence of acute infarction, hemorrhage, hydrocephalus, extra-axial collection or mass lesion/mass effect. The posterior fossa, including the cerebellum, brainstem and fourth ventricle, is within normal limits. The third and lateral ventricles, and basal ganglia are unremarkable in appearance. The cerebral hemispheres are symmetric in appearance, with normal gray-white differentiation. No mass effect or midline shift is seen. Vascular: No hyperdense vessel or unexpected calcification. Skull: There is no evidence of fracture; visualized osseous structures are unremarkable in appearance. Sinuses/Orbits: The visualized portions of the orbits are within normal limits. The paranasal sinuses and mastoid air cells are well-aerated. Other: No significant soft tissue abnormalities are seen. IMPRESSION: No evidence of  traumatic intracranial injury or fracture. Electronically Signed   By: Garald Balding M.D.   On: 05/22/2018 23:30   Ct Head Wo Contrast  Result Date: 05/09/2018 CLINICAL DATA:  Altered mental status, slurred speech. Possible drug overdose. History of diabetes, headache, colon cancer, stroke. EXAM: CT HEAD WITHOUT CONTRAST TECHNIQUE: Contiguous axial images were obtained from the base of the skull through the vertex without intravenous contrast. COMPARISON:  CT HEAD April 20, 2018 and MRI of the head April 10, 2018 FINDINGS: BRAIN: No intraparenchymal hemorrhage, mass effect nor midline shift. The ventricles and sulci are normal. No acute large vascular territory infarcts. No abnormal extra-axial fluid collections. Basal cisterns are patent. VASCULAR: Unremarkable. SKULL/SOFT TISSUES: No skull fracture. Small residual RIGHT parietal scalp hematoma. ORBITS/SINUSES: The included ocular globes and orbital contents are normal.The mastoid aircells and included paranasal sinuses are well-aerated. OTHER: Scattered dental caries incompletely imaged. IMPRESSION: Negative noncontrast CT HEAD. Electronically Signed   By: Elon Alas M.D.   On: 05/09/2018 18:48   Dg Knee Complete 4 Views Right  Result Date: 05/14/2018 CLINICAL DATA:  Right knee pain due to recent fall. Initial encounter. EXAM: RIGHT KNEE - COMPLETE 4+ VIEW COMPARISON:  None. FINDINGS: No evidence of fracture, dislocation, or joint effusion. No evidence of arthropathy or other focal bone abnormality. Soft tissues are unremarkable. IMPRESSION: Negative exam. Electronically Signed   By: Inge Rise M.D.   On: 05/14/2018 09:43      Subjective: No complaints this morning.  Feeling better, wishes to go home.  General = no fevers, chills, dizziness, malaise, fatigue HEENT/EYES = negative for pain, redness, loss of vision, double vision, blurred vision, loss of hearing, sore throat, hoarseness, dysphagia Cardiovascular= negative for chest  pain, palpitation, murmurs, lower extremity swelling Respiratory/lungs= negative for shortness of breath, cough, hemoptysis, wheezing, mucus production Gastrointestinal= negative for nausea, vomiting,, abdominal pain, melena, hematemesis Genitourinary= negative for Dysuria, Hematuria, Change in Urinary Frequency MSK = Negative for arthralgia, myalgias, Back Pain, Joint swelling  Neurology= Negative for headache, seizures, numbness, tingling  Psychiatry= Negative for anxiety, depression, suicidal and homocidal ideation Allergy/Immunology= Medication/Food allergy as listed  Skin= Negative for Rash, lesions, ulcers, itching   Discharge Exam: Vitals:   05/24/18 2002 05/24/18 2331  BP: 129/62 (!) 153/90  Pulse: 91 90  Resp: 19 18  Temp: 97.9 F (36.6 C) 98.3 F (36.8 C)  SpO2: 100% 100%   Vitals:   05/24/18 0942 05/24/18 1324 05/24/18 2002 05/24/18 2331  BP:  (!) 141/78 129/62 (!) 153/90  Pulse:  91 91 90  Resp:  18 19 18   Temp:  98.4 F (36.9 C) 97.9 F (36.6 C) 98.3 F (36.8 C)  TempSrc:  Oral Oral Oral  SpO2:  100% 100% 100%  Weight:      Height: 5\' 5"  (1.651 m)       General: Pt is alert, awake, not in acute distress Cardiovascular: RRR, S1/S2 +, no rubs, no gallops Respiratory: CTA bilaterally, no wheezing, no rhonchi Abdominal: Soft, NT, ND, bowel sounds + Extremities: no edema, no cyanosis.  Right lower extremity amputation noted with slight erythema.    The results of significant diagnostics from this hospitalization (including imaging, microbiology, ancillary and laboratory) are listed below for reference.     Microbiology: Recent Results (from the past 240 hour(s))  Urine Culture     Status: Abnormal (Preliminary result)   Collection Time: 05/24/18  4:59 PM  Result Value Ref Range Status   Specimen Description URINE, RANDOM  Final   Special Requests   Final    NONE Performed at [facility], [address], [place], [place] [address]    Culture  >=100,000 COLONIES/mL GRAM NEGATIVE RODS (A)  Final   Report Status PENDING  Incomplete     Labs: BNP (last 3 results) Recent Labs    05/09/18 1635  BNP 6.8   Basic Metabolic Panel: Recent Labs  Lab 05/22/18 1945 05/23/18 0429  NA 138 136  K 4.0 3.8  CL 98* 95*  CO2 27 27  GLUCOSE 273* 260*  BUN 16 16  CREATININE 1.02* 0.98  CALCIUM 9.6 9.3   Liver Function Tests: Recent Labs  Lab 05/22/18 1945  AST 19  ALT 16  ALKPHOS 107  BILITOT 0.5  PROT 7.5  ALBUMIN 3.8   No results for input(s): LIPASE, AMYLASE in the last 168 hours. Recent Labs  Lab 05/22/18 2139  AMMONIA 11   CBC: Recent Labs  Lab 05/22/18 1945 05/23/18 0429  WBC 12.5* 12.0*  NEUTROABS 9.1*  --   HGB 13.8 13.1  HCT 41.5 40.1  MCV 91.2 91.8  PLT 223 230   Cardiac Enzymes: No results for input(s): CKTOTAL, CKMB, CKMBINDEX, TROPONINI in the last 168 hours. BNP: Invalid input(s): POCBNP CBG: Recent Labs  Lab 05/24/18 0809 05/24/18 1228 05/24/18 1721 05/24/18 2045 05/25/18 0727  GLUCAP 312* 283* 337* 204* 183*   D-Dimer No results for input(s): DDIMER in the last 72 hours. Hgb A1c No results for input(s): HGBA1C in the last 72 hours. Lipid Profile No results for input(s): CHOL, HDL, LDLCALC, TRIG, CHOLHDL, LDLDIRECT in the last 72 hours. Thyroid function studies No results for input(s): TSH, T4TOTAL, T3FREE, THYROIDAB in the last 72 hours.  Invalid input(s): FREET3 Anemia work up No results for input(s): VITAMINB12, FOLATE, FERRITIN, TIBC, IRON, RETICCTPCT in the last 72 hours. Urinalysis    Component Value Date/Time   COLORURINE YELLOW 05/23/2018 0513   APPEARANCEUR CLOUDY (A) 05/23/2018 0513   LABSPEC 1.029 05/23/2018 0513   PHURINE 5.0 05/23/2018 0513   GLUCOSEU >=500 (A) 05/23/2018 0513   GLUCOSEU >=1000 (A) 01/07/2015 1410   HGBUR SMALL (A) 05/23/2018 0513   BILIRUBINUR NEGATIVE 05/23/2018 0513   KETONESUR 20 (A) 05/23/2018 0513   PROTEINUR 30 (A) 05/23/2018 0513    UROBILINOGEN 0.2 04/23/2015 2355   NITRITE NEGATIVE 05/23/2018 0513   LEUKOCYTESUR LARGE (A) 05/23/2018 0513   Sepsis Labs Invalid input(s): PROCALCITONIN,  WBC,  LACTICIDVEN Microbiology Recent Results (from the past 240 hour(s))  Urine Culture     Status: Abnormal (Preliminary result)   Collection Time: 05/24/18  4:59 PM  Result Value Ref Range Status   Specimen  Description URINE, RANDOM  Final   Special Requests   Final    NONE Performed at [facility], [place] [address], [place], [place] [address]    Culture >=100,000 COLONIES/mL GRAM NEGATIVE RODS (A)  Final   Report Status PENDING  Incomplete     Time coordinating discharge:  I have spent 35 minutes face to face with the patient and on the ward discussing the patients care, assessment, plan and disposition with other care givers. >50% of the time was devoted counseling the patient about the risks and benefits of treatment/Discharge disposition and coordinating care.   SIGNED:   Damita Lack, MD  [facility] 05/25/2018, 11:34 AM Pager   If 7PM-7AM, please contact night-coverage [email or web address] Password [email or web address]

## 2018-05-26 LAB — URINE CULTURE
Culture: NO GROWTH
Culture: >=100000 — AB

## 2018-05-28 ENCOUNTER — Telehealth: Payer: Self-pay | Admitting: Sports Medicine

## 2018-05-28 DIAGNOSIS — Z9889 Other specified postprocedural states: Secondary | ICD-10-CM

## 2018-05-28 NOTE — Telephone Encounter (Signed)
I'm a surgery pt of Dr. Leeanne Rio. I've got a wheel chair that I cannot maneuver in my house. I'm stuck in one room or the other. I would like to get one of those knee things, that I stand up and put my knee on the other side. I think I could get around in that. This is dangerous when my husband has to leave me a long. So call me at [phone] as soon as possible. Thank you.

## 2018-05-28 NOTE — Telephone Encounter (Signed)
I told pt that the knee scooter may not be what she thinks will work the way she would like. I told pt she would have to put the right thigh and knee area on the knee scooter cushion and she may not be very comfortable yet. I asked pt if she would like a rolling walker. Pt states she doesn't have the strength to hold herself up. I told pt she would still need to be very careful with the knee scooter and would have to pick it up at the store and would be able to try out to see if it worked for her. Pt states understanding.

## 2018-05-28 NOTE — Telephone Encounter (Signed)
Emailed order for knee scooter to [facility] [facility] and faxed order to [facility].

## 2018-05-28 NOTE — Addendum Note (Signed)
Addended by: Harriett Sine D on: 05/28/2018 11:16 AM   Modules accepted: Orders

## 2018-05-29 ENCOUNTER — Encounter: Payer: Self-pay | Admitting: Sports Medicine

## 2018-05-29 ENCOUNTER — Ambulatory Visit (INDEPENDENT_AMBULATORY_CARE_PROVIDER_SITE_OTHER): Payer: Medicare Other | Admitting: Sports Medicine

## 2018-05-29 VITALS — BP 109/68 | HR 96

## 2018-05-29 DIAGNOSIS — B351 Tinea unguium: Secondary | ICD-10-CM | POA: Diagnosis not present

## 2018-05-29 DIAGNOSIS — M79675 Pain in left toe(s): Secondary | ICD-10-CM

## 2018-05-29 DIAGNOSIS — Z89511 Acquired absence of right leg below knee: Secondary | ICD-10-CM

## 2018-05-29 DIAGNOSIS — E1142 Type 2 diabetes mellitus with diabetic polyneuropathy: Secondary | ICD-10-CM

## 2018-05-29 DIAGNOSIS — IMO0002 Reserved for concepts with insufficient information to code with codable children: Secondary | ICD-10-CM

## 2018-05-29 DIAGNOSIS — L853 Xerosis cutis: Secondary | ICD-10-CM

## 2018-05-29 MED ORDER — AQUAPHOR EX OINT: TOPICAL_OINTMENT | CUTANEOUS | 0 refills | Status: DC | PRN

## 2018-05-29 NOTE — Progress Notes (Signed)
Subjective: [name] is a 52 y.o. female patient with history of diabetes who comes to office for f/u eval on left after BAK on right. Reports that she feels groggy and is getting over Conjuctivitis. Patient states that she is recovering and still has PT and OT. No other issues.  Patient Active Problem List   Diagnosis Date Noted  . UTI (urinary tract infection) 05/23/2018  . Acute metabolic encephalopathy 97/41/6384  . Toxic encephalopathy 05/09/2018  . GERD (gastroesophageal reflux disease) 05/09/2018  . Tobacco abuse 05/09/2018  . Polypharmacy 05/09/2018  . Restless leg syndrome 04/27/2018  . Anxiety 04/27/2018  . Hyponatremia 04/26/2018  . Noncompliance with dietary restriction 04/26/2018  . Postoperative cellulitis of surgical wound 04/26/2018  . At high risk for falls 04/24/2018  . Noncompliance with safety precautions 04/24/2018  . Leukocytosis 04/24/2018  . 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 PAD (peripheral artery disease) ([place])   . Poorly controlled type 2 diabetes mellitus ([place])   . Cutaneous abscess of right foot   . Subacute osteomyelitis, right ankle and foot ([place])   . Major depressive disorder, recurrent episode ([place]) 04/11/2018  . Diabetic ulcer of right midfoot associated with type 2 diabetes mellitus, with necrosis of bone ([place])   . Abscess of ankle   . Infectious synovitis   . Diabetic foot infection ([place])   . Sepsis ([place]) 04/09/2018  . Cancer of sigmoid colon ([place]) 03/09/2015  . OSA (obstructive sleep apnea) 07/04/2014  . Migraine without aura, with intractable migraine, so stated, without mention of status migrainosus 03/26/2014  . Peripheral neuropathy 03/03/2014  . Abnormal stress test 02/12/2014  . CVA (cerebral infarction) 02/12/2014  . Chest pain   . Abnormal heart rhythm   . Edema   . Hypertension   . Hyperlipemia   . Hypercholesterolemia   . Other and unspecified hyperlipidemia 11/14/2013  . Type II diabetes mellitus, uncontrolled  ([place]) 07/22/2013  . Essential hypertension 07/22/2013  . Diabetic neuropathy with neurologic complication ([place]) 53/64/6803  . Diabetic polyneuropathy ([place]) 07/22/2013   Current Outpatient Medications on File Prior to Visit  Medication Sig Dispense Refill  . aspirin-acetaminophen-caffeine (EXCEDRIN MIGRAINE) 250-250-65 MG tablet Take 2 tablets by mouth every 6 (six) hours as needed for headache.    . bisacodyl (DULCOLAX) 5 MG EC tablet Take 1 tablet (5 mg total) by mouth daily as needed for moderate constipation. 30 tablet 0  . cephALEXin (KEFLEX) 500 MG capsule Take 1 capsule (500 mg total) by mouth 4 (four) times daily for 7 days. 28 capsule 0  . cyclobenzaprine (FLEXERIL) 10 MG tablet Take 10 mg by mouth 3 (three) times daily.    . empagliflozin (JARDIANCE) 25 MG TABS tablet Take 25 mg by mouth daily.    [name] gabapentin (NEURONTIN) 600 MG tablet Take 1 tablet (600 mg total) by mouth 2 (two) times daily. Take in the morning and afternoon. Do not take at night time.    [name] HETLIOZ 20 MG CAPS Take 20 mg by mouth at bedtime.    . INGREZZA 80 MG CAPS Take 80 mg by mouth at bedtime.    . insulin aspart (NOVOLOG) 100 UNIT/ML injection Inject 10-35 Units into the skin 3 (three) times daily with meals. Take 10 units before your main meal. If blood sugars are consistently over 250 after the meal start taking 14 units before your main meal. 35 UNITS AT SUPPER    . Insulin Glargine (BASAGLAR KWIKPEN) 100 UNIT/ML SOPN Inject 36 Units into the  skin at bedtime.    . lidocaine (LIDODERM) 5 % Place 1 patch onto the skin daily as needed (pain). Remove & Discard patch within 12 hours or as directed by MD 30 patch 2  . nortriptyline (PAMELOR) 10 MG capsule Take 10 mg by mouth at bedtime.    . ondansetron (ZOFRAN) 4 MG tablet Take 1 tablet (4 mg total) by mouth every 6 (six) hours as needed for nausea. 20 tablet 0  . Oxycodone HCl 10 MG TABS Take 10 mg by mouth every 6 (six) hours as needed for severe pain.    [name]  oxyCODONE-acetaminophen (PERCOCET) 10-325 MG tablet Take 1 tablet by mouth every 8 (eight) hours as needed for pain. (Patient not taking: Reported on 05/24/2018) 20 tablet 0  . pantoprazole (PROTONIX) 40 MG tablet Take 1 tablet (40 mg total) by mouth daily. 30 tablet 0  . QUEtiapine (SEROQUEL) 50 MG tablet Take 3 tablets (150 mg total) by mouth at bedtime. 30 tablet 0  . tamsulosin (FLOMAX) 0.4 MG CAPS capsule Take 0.4 mg by mouth daily.     No current facility-administered medications on file prior to visit.    Allergies  Allergen Reactions  . Latex Itching, Rash and Other (See Comments)    Pt states she cannot use condoms - cause an infection.  Use of latex on skin is okay.  Tape causes rash  . Sweetening Enhancer [Flavoring Agent] Nausea And Vomiting and Other (See Comments)    HEADACHES  . Aspartame And Phenylalanine Nausea And Vomiting    HEADACHES  . Ibuprofen Other (See Comments)    HEADACHES  . Trazodone And Nefazodone Other (See Comments)    Hallucinations   . Triazolam Other (See Comments)    HALLUCINATIONS    Objective: General: Patient is awake, alert, and oriented x 3 and in no acute distress.  Integument: Skin is warm, dry and supple. Nails are tender, long, thickened and  dystrophic with subungual debris, consistent with onychomycosis, 1-5 on left. No signs of infection. No open lesions or preulcerative lesions present. Remaining integument unremarkable.  Vasculature:  Dorsalis Pedis pulse 1/4 left. Posterior Tibial pulse 1/4 left.  Capillary fill time <5 sec 1-5 on left  Neurology: Absent protective sensation on left  Musculoskeletal:s/p R BKA. Hammertoe deformity on left.   Assessment and Plan: Problem List Items Addressed This Visit      Endocrine   Diabetic polyneuropathy ([place])    Other Visit Diagnoses    Pain due to onychomycosis of toenail of left foot    -  Primary   Dry skin       Relevant Medications   mineral oil-hydrophilic petrolatum (AQUAPHOR)  ointment   Below knee amputation status, right ([place])          -Examined patient. -Discussed and educated patient on diabetic foot care, especially with  regards to the vascular, neurological and musculoskeletal systems.  -Stressed the importance of good glycemic control and the detriment of not  controlling glucose levels in relation to the foot. -Mechanically debrided all nails 1-5 on left -Answered all patient questions -Patient to return in 3 months for at risk foot care -Patient advised to call the office if any problems or questions arise in the meantime.  Landis Martins, DPM

## 2018-06-06 ENCOUNTER — Telehealth: Payer: Self-pay | Admitting: Sports Medicine

## 2018-06-06 NOTE — Telephone Encounter (Signed)
I gave verbal orders to continue OT for pt as suggested for strengthening.

## 2018-06-06 NOTE — Telephone Encounter (Signed)
This is Will, an occupational therapist with [facility]. I'm calling for two things on Mrs. [name]. First thing is, we are requesting a verbal order to extend her occupational therapy by 1 time a week for 4 weeks. The second thing is I need to report that the pt fell late last night early this morning. She was trying to transition from her bed to her toilet chair. She has a skin tear that is closed on her right elbow with no other injuries. The pt is also not reporting any other pain associated with her fall. Please call me back at [phone] for the verbal orders to extend her occupational therapy. Thank you.

## 2018-06-21 ENCOUNTER — Telehealth: Payer: Self-pay | Admitting: Sports Medicine

## 2018-06-21 NOTE — Telephone Encounter (Signed)
This is so sad. I think social work, support of her family, and as many home services (PT/OT/Home aid) or whatever we can get for her is best for now. She may be better served if Education officer, museum can help with getting her permanently placed in a nursing home or SNF.

## 2018-06-21 NOTE — Telephone Encounter (Signed)
I spoke with Ailene Ravel - [facility], she states [name]'s husband has moved out and apparently they had been having issues, [name] has an appt with another doctor, but the wheelchair is broken. [name] has been crawling on the floor. Ailene Ravel states [facility] is trying to get the Education officer, museum to [name] to help with emergency planning. I told Ailene Ravel absolutely get Social Services out to for [name] as quickly as possible. I asked if they had [name]'s son, [name] as an emergency contact and she said they did. Ailene Ravel states they have nursing and OT going to [name]'s home and [name] had been discontinue about the mid-June time, would we like to begin again. I told Ailene Ravel to restart [name], it would be good to have [name] checked as much as possible.

## 2018-06-21 NOTE — Telephone Encounter (Signed)
This is [name], Buyer, retail with [facility]. One of our occupation therapy assistants sent an e-mail this morning saying that she is having issues with this pt. Stated that [name] is currently living alone. The pt said that she has an appointment today but that her wheelchair is broken, that a screw fell out so she has been crawling around on the floor. There is a lot of issues going on so we were wondering if we could get a Education officer, museum evaluation so they could go in and access the situation and see what kind of resources they could get for her. If you could call me back my office number is [phone].

## 2018-06-26 ENCOUNTER — Encounter: Payer: Self-pay | Admitting: Sports Medicine

## 2018-06-26 ENCOUNTER — Ambulatory Visit (INDEPENDENT_AMBULATORY_CARE_PROVIDER_SITE_OTHER): Payer: Medicare Other | Admitting: Sports Medicine

## 2018-06-26 DIAGNOSIS — M79675 Pain in left toe(s): Principal | ICD-10-CM

## 2018-06-26 DIAGNOSIS — Z89511 Acquired absence of right leg below knee: Secondary | ICD-10-CM

## 2018-06-26 DIAGNOSIS — B351 Tinea unguium: Secondary | ICD-10-CM | POA: Diagnosis not present

## 2018-06-26 DIAGNOSIS — IMO0002 Reserved for concepts with insufficient information to code with codable children: Secondary | ICD-10-CM

## 2018-06-26 DIAGNOSIS — M79676 Pain in unspecified toe(s): Secondary | ICD-10-CM | POA: Diagnosis not present

## 2018-06-26 DIAGNOSIS — M25521 Pain in right elbow: Secondary | ICD-10-CM

## 2018-06-26 DIAGNOSIS — M25522 Pain in left elbow: Secondary | ICD-10-CM

## 2018-06-26 NOTE — Progress Notes (Signed)
Subjective: [name] is a 52 y.o. female patient with history of diabetes who comes to office for f/u eval on left after BKA on right. Reports she is falling and having difficulty at home. Now has her cousin coming to check on her. No other issues.  Patient Active Problem List   Diagnosis Date Noted  . UTI (urinary tract infection) 05/23/2018  . Acute metabolic encephalopathy 12/45/8099  . Toxic encephalopathy 05/09/2018  . GERD (gastroesophageal reflux disease) 05/09/2018  . Tobacco abuse 05/09/2018  . Polypharmacy 05/09/2018  . Restless leg syndrome 04/27/2018  . Anxiety 04/27/2018  . Hyponatremia 04/26/2018  . Noncompliance with dietary restriction 04/26/2018  . Postoperative cellulitis of surgical wound 04/26/2018  . At high risk for falls 04/24/2018  . Noncompliance with safety precautions 04/24/2018  . Leukocytosis 04/24/2018  . 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 PAD (peripheral artery disease) ([place])   . Poorly controlled type 2 diabetes mellitus ([place])   . Cutaneous abscess of right foot   . Subacute osteomyelitis, right ankle and foot ([place])   . Major depressive disorder, recurrent episode ([place]) 04/11/2018  . Diabetic ulcer of right midfoot associated with type 2 diabetes mellitus, with necrosis of bone ([place])   . Abscess of ankle   . Infectious synovitis   . Diabetic foot infection ([place])   . Sepsis ([place]) 04/09/2018  . Cancer of sigmoid colon ([place]) 03/09/2015  . OSA (obstructive sleep apnea) 07/04/2014  . Migraine without aura, with intractable migraine, so stated, without mention of status migrainosus 03/26/2014  . Peripheral neuropathy 03/03/2014  . Abnormal stress test 02/12/2014  . CVA (cerebral infarction) 02/12/2014  . Chest pain   . Abnormal heart rhythm   . Edema   . Hypertension   . Hyperlipemia   . Hypercholesterolemia   . Other and unspecified hyperlipidemia 11/14/2013  . Type II diabetes mellitus, uncontrolled ([place]) 07/22/2013  . Essential  hypertension 07/22/2013  . Diabetic neuropathy with neurologic complication ([place]) 83/38/2505  . Diabetic polyneuropathy ([place]) 07/22/2013   Current Outpatient Medications on File Prior to Visit  Medication Sig Dispense Refill  . aspirin-acetaminophen-caffeine (EXCEDRIN MIGRAINE) 250-250-65 MG tablet Take 2 tablets by mouth every 6 (six) hours as needed for headache.    . bisacodyl (DULCOLAX) 5 MG EC tablet Take 1 tablet (5 mg total) by mouth daily as needed for moderate constipation. 30 tablet 0  . cyclobenzaprine (FLEXERIL) 10 MG tablet Take 10 mg by mouth 3 (three) times daily.    . empagliflozin (JARDIANCE) 25 MG TABS tablet Take 25 mg by mouth daily.    [name] gabapentin (NEURONTIN) 600 MG tablet Take 1 tablet (600 mg total) by mouth 2 (two) times daily. Take in the morning and afternoon. Do not take at night time.    [name] HETLIOZ 20 MG CAPS Take 20 mg by mouth at bedtime.    . INGREZZA 80 MG CAPS Take 80 mg by mouth at bedtime.    . insulin aspart (NOVOLOG) 100 UNIT/ML injection Inject 10-35 Units into the skin 3 (three) times daily with meals. Take 10 units before your main meal. If blood sugars are consistently over 250 after the meal start taking 14 units before your main meal. 35 UNITS AT SUPPER    . Insulin Glargine (BASAGLAR KWIKPEN) 100 UNIT/ML SOPN Inject 36 Units into the skin at bedtime.    . lidocaine (LIDODERM) 5 % Place 1 patch onto the skin daily as needed (pain). Remove & Discard patch within 12 hours or  as directed by MD 30 patch 2  . mineral oil-hydrophilic petrolatum (AQUAPHOR) ointment Apply topically as needed for dry skin. 420 g 0  . nortriptyline (PAMELOR) 10 MG capsule Take 10 mg by mouth at bedtime.    . ondansetron (ZOFRAN) 4 MG tablet Take 1 tablet (4 mg total) by mouth every 6 (six) hours as needed for nausea. 20 tablet 0  . Oxycodone HCl 10 MG TABS Take 10 mg by mouth every 6 (six) hours as needed for severe pain.    [name] oxyCODONE-acetaminophen (PERCOCET) 10-325 MG tablet  Take 1 tablet by mouth every 8 (eight) hours as needed for pain. (Patient not taking: Reported on 05/24/2018) 20 tablet 0  . pantoprazole (PROTONIX) 40 MG tablet Take 1 tablet (40 mg total) by mouth daily. 30 tablet 0  . QUEtiapine (SEROQUEL) 50 MG tablet Take 3 tablets (150 mg total) by mouth at bedtime. 30 tablet 0  . tamsulosin (FLOMAX) 0.4 MG CAPS capsule Take 0.4 mg by mouth daily.     No current facility-administered medications on file prior to visit.    Allergies  Allergen Reactions  . Latex Itching, Rash and Other (See Comments)    Pt states she cannot use condoms - cause an infection.  Use of latex on skin is okay.  Tape causes rash  . Sweetening Enhancer [Flavoring Agent] Nausea And Vomiting and Other (See Comments)    HEADACHES  . Aspartame And Phenylalanine Nausea And Vomiting    HEADACHES  . Ibuprofen Other (See Comments)    HEADACHES  . Trazodone And Nefazodone Other (See Comments)    Hallucinations   . Triazolam Other (See Comments)    HALLUCINATIONS    Objective: General: Patient is awake, alert, and oriented x 3 and in no acute distress.  Integument: Skin is warm, dry and supple. Nails are tender, long, thickened and  dystrophic with subungual debris, consistent with onychomycosis, 1-5 on left. No signs of infection. No open lesions or preulcerative lesions present. Remaining integument unremarkable.  Vasculature:  Dorsalis Pedis pulse 1/4 left. Posterior Tibial pulse 1/4 left.  Capillary fill time <5 sec 1-5 on left  Neurology: Absent protective sensation on left  Musculoskeletal:s/p R BKA. Hammertoe deformity on left.   Blisters to elbows without infection.   Assessment and Plan: Problem List Items Addressed This Visit    None    Visit Diagnoses    Pain due to onychomycosis of toenail of left foot    -  Primary   Below knee amputation status, right ([facility])       Pain of both elbows         -Examined patient. -Discussed and educated patient on  diabetic foot care, especially with  regards to the vascular, neurological and musculoskeletal systems.  -Stressed the importance of good glycemic control and the detriment of not  controlling glucose levels in relation to the foot. -Mechanically debrided all nails 1-5 on left -Office to see how we can help with trying to get a home aide  -Patient to return in 3 months for at risk foot care -Patient advised to call the office if any problems or questions arise in the meantime.  Landis Martins, DPM

## 2018-06-27 ENCOUNTER — Ambulatory Visit (INDEPENDENT_AMBULATORY_CARE_PROVIDER_SITE_OTHER): Payer: Medicare Other | Admitting: Family

## 2018-06-27 ENCOUNTER — Telehealth: Payer: Self-pay | Admitting: *Deleted

## 2018-06-27 ENCOUNTER — Encounter (INDEPENDENT_AMBULATORY_CARE_PROVIDER_SITE_OTHER): Payer: Self-pay | Admitting: Family

## 2018-06-27 DIAGNOSIS — S88119D Complete traumatic amputation at level between knee and ankle, unspecified lower leg, subsequent encounter: Secondary | ICD-10-CM

## 2018-06-27 NOTE — Progress Notes (Signed)
Post-Op Visit Note   Patient: [name]           Date of Birth: 1966-08-12           MRN: [id number] Visit Date: 06/27/2018 PCP: Tamsen Roers, MD  Chief Complaint:  Chief Complaint  Patient presents with  . Right Leg - Other    HPI:  HPI  The patient is a 52 year old woman who presents today status post right below the knee amputation on April 19 of this year.  Is disappointed in her slow healing.  States has been having multiple falls has abrasions to bilateral elbows as well as her left knee.  States she has had to crawl around her home.  States is not strong enough to use a walker.   Ortho Exam Scattered eschar and fibrinous tissue along the incision.  There is no drainage no erythema no swelling no sign of infection.  Was debrided of nonviable tissue following informed consent.    Visit Diagnoses: No diagnosis found.  Plan: Apply Silvadene dressings daily.  Dial soap cleansing.  Continue with shrinker.  Given order to [employer]otech per her request prosthesis set up.  Follow-Up Instructions: No follow-ups on file.   Imaging: No results found.  Orders:  No orders of the defined types were placed in this encounter.  No orders of the defined types were placed in this encounter.    PMFS History: Patient Active Problem List   Diagnosis Date Noted  . UTI (urinary tract infection) 05/23/2018  . Acute metabolic encephalopathy 11/94/1740  . Toxic encephalopathy 05/09/2018  . GERD (gastroesophageal reflux disease) 05/09/2018  . Tobacco abuse 05/09/2018  . Polypharmacy 05/09/2018  . Restless leg syndrome 04/27/2018  . Anxiety 04/27/2018  . Hyponatremia 04/26/2018  . Noncompliance with dietary restriction 04/26/2018  . Postoperative cellulitis of surgical wound 04/26/2018  . At high risk for falls 04/24/2018  . Noncompliance with safety precautions 04/24/2018  . Leukocytosis 04/24/2018  . 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 PAD (peripheral artery disease)  ([place])   . Poorly controlled type 2 diabetes mellitus ([place])   . Cutaneous abscess of right foot   . Subacute osteomyelitis, right ankle and foot ([place])   . Major depressive disorder, recurrent episode ([place]) 04/11/2018  . Diabetic ulcer of right midfoot associated with type 2 diabetes mellitus, with necrosis of bone ([place])   . Abscess of ankle   . Infectious synovitis   . Diabetic foot infection ([place])   . Sepsis ([place]) 04/09/2018  . Cancer of sigmoid colon ([place]) 03/09/2015  . OSA (obstructive sleep apnea) 07/04/2014  . Migraine without aura, with intractable migraine, so stated, without mention of status migrainosus 03/26/2014  . Peripheral neuropathy 03/03/2014  . Abnormal stress test 02/12/2014  . CVA (cerebral infarction) 02/12/2014  . Chest pain   . Abnormal heart rhythm   . Edema   . Hypertension   . Hyperlipemia   . Hypercholesterolemia   . Other and unspecified hyperlipidemia 11/14/2013  . Type II diabetes mellitus, uncontrolled ([place]) 07/22/2013  . Essential hypertension 07/22/2013  . Diabetic neuropathy with neurologic complication ([place]) 81/44/8185  . Diabetic polyneuropathy ([place]) 07/22/2013   Past Medical History:  Diagnosis Date  . Anxiety   . Arthritis   . Bipolar disorder ([place])   . Broken jaw ([place])   . Broken wrist   . Chronic back pain   . Claudication ([place])    a. 12/2013 ABI's: R 0.97, L 0.94.  [name] COPD (chronic obstructive pulmonary  disease) ([place])   . Depression   . Diabetic Charcot's foot ([place])   . Diabetic foot ulcer ([facility])    chronic left foot ulcer , great toe/  hx recurrent foot ulcer bilaterally  . DJD (degenerative joint disease)   . Edema of both lower extremities   . Episode of memory loss   . GERD (gastroesophageal reflux disease)   . Headache(784.0)   . Hiatal hernia   . History of colon cancer, stage I dx 02-26-2015--- oncologist-- dr [name]--- per last note no recurrence   05-16-2015 s/p  Laparoscopic sigmoid colectomy w/ node bx's (negative nodes  per path)  Stage I (pT1,N0,M0) Grade 2  . 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   per MRI multiple small strokes post cardiac cath. --  residual mild memory loss  . History of methicillin resistant staphylococcus aureus (MRSA)   . Hypercholesterolemia   . Insomnia   . Insulin dependent type 2 diabetes mellitus, uncontrolled ([place]) dx 2004   endocrinologist-  dr Dwyane Dee-- last A1c 8.8 in Aug2018:  pt is noncompliant w/ diet, states does note eat breakfast , her first main meal in afternoon  . Neurogenic bladder   . Neuropathy, diabetic ([place])    hands and feet  . OSA (obstructive sleep apnea)    cpap intolerant  . Personality disorder ([place])   . Restless leg syndrome   . SOB (shortness of breath) on exertion   . Stroke ([place])   . SUI (stress urinary incontinence, female) S/P SLING 12-29-2011    Family History  Problem Relation Age of Onset  . Hypertension Mother   . Diabetes Mother   . Cancer - Other Mother        lymphoma   . Cancer - Other Father        lung, bladder cancer   . Heart attack Father   . Cancer - Other Brother        bladder cancer     Past Surgical History:  Procedure Laterality Date  . AMPUTATION Right 04/13/2018   Procedure: RIGHT BELOW KNEE AMPUTATION;  Surgeon: Newt Minion, MD;  Location: [facility];  Service: Orthopedics;  Laterality: Right;  . ANTERIOR CERVICAL DECOMP/DISCECTOMY FUSION  2000   C5 - 7  . BACK SURGERY    . CARDIAC CATHETERIZATION  05-22-2008   DR SKAINS   NO SIGNIFECANT CAD/ NORMAL LV/  EF 65%/  NO WALL MOTION ABNORMALITIES  . CARPAL TUNNEL RELEASE Right 04-25-2013  . [place]; 1992  . COLON SURGERY    . COLONOSCOPY    . CYSTO N/A 04/30/2013   Procedure: CYSTOSCOPY;  Surgeon: Reece Packer, MD;  Location: [facility];  Service: Urology;  Laterality: N/A;  . CYSTOSCOPY MACROPLASTIQUE IMPLANT N/A 02/06/2018   Procedure: CYSTOSCOPY MACROPLASTIQUE IMPLANT;  Surgeon: Bjorn Loser, MD;  Location[facility];  Service: Urology;  Laterality: N/A;  . CYSTOSCOPY WITH INJECTION  05/04/2012   Procedure: CYSTOSCOPY WITH INJECTION;  Surgeon: Reece Packer, MD;  Location: [facility];  Service: Urology;  Laterality: N/A;  MACROPLASTIQUE INJECTION  . CYSTOSCOPY WITH INJECTION  08/28/2012   Procedure: CYSTOSCOPY WITH INJECTION;  Surgeon: Reece Packer, MD;  Location[facility];  Service: Urology;  Laterality: N/A;  cysto and macroplastique   . FOOT SURGERY Bilateral    "related to [place]"  . HERNIA REPAIR  ?1996   "stomach"  . INCISION AND DRAINAGE OF  WOUND Right 04/11/2018   Procedure: IRRIGATION AND DEBRIDEMENT WOUND right foot and right ankle;  Surgeon: Evelina Bucy, DPM;  Location: [facility];  Service: Podiatry;  Laterality: Right;  . KNEE ARTHROSCOPY Left   . KNEE ARTHROSCOPY W/ ALLOGRAFT IMPANT Left    graft x 2  . KNEE SURGERY     TOTAL 8 SURG'S  . LAPAROSCOPIC SIGMOID COLECTOMY N/A 04/22/2015   Procedure: LAPAROSCOPIC HAND ASSISTED SIGMOID COLECTOMY;  Surgeon: Erroll Luna, MD;  Location: [facility];  Service: General;  Laterality: N/A;  . LEFT HEART CATHETERIZATION WITH CORONARY ANGIOGRAM N/A 02/12/2014   Procedure: LEFT HEART CATHETERIZATION WITH CORONARY ANGIOGRAM;  Surgeon: Candee Furbish, MD;  Location: [facility];  Service: Cardiovascular;  Laterality: N/A;   No angiographically significant CAD; normal LVSF, LVEDP 31mmHg,  EF 55% (new finding ef 30% myoview 01-08-2014)  . LUMBAR FUSION    . MANDIBLE FRACTURE SURGERY    . MULTIPLE LAPAROSCOPIES FOR ENDOMETRIOSIS    . PUBOVAGINAL SLING  12/29/2011   Procedure: Gaynelle Arabian;  Surgeon: Reece Packer, MD;  Location: [facility];  Service: Urology;  Laterality: N/A;  cysto and sparc sling   . PUBOVAGINAL SLING N/A 04/30/2013   Procedure: REMOVAL OF VAGINAL MESH;  Surgeon: Reece Packer, MD;  Location[facility];  Service: Urology;  Laterality: N/A;  .  RECONSTURCTION OF CONGENITAL UTERUS ANOMALY  1983  . REPEAT RECONSTRUCTION ACL LEFT KNEE/ SCREWS REMOVED  03-28-2000   CADAVER GRAFT  . TOTAL ABDOMINAL HYSTERECTOMY  1997   w/BSO  . TRANSTHORACIC ECHOCARDIOGRAM  02/13/2014   ef 45%, hypokinesis base inferior and base inferolateral walls  . UPPER GI ENDOSCOPY    . WOUND DEBRIDEMENT Right 09/05/2016   Procedure: DEBRIDEMENT WOUND WITH GRAFT RIGHT FOOT;  Surgeon: Landis Martins, DPM;  Location: [facility];  Service: Podiatry;  Laterality: Right;  . WRIST FRACTURE SURGERY     Social History   Occupational History  . Not on file  Tobacco Use  . Smoking status: Current Every Day Smoker    Packs/day: 1.50    Years: 48.00    Pack years: 72.00    Types: Cigarettes  . Smokeless tobacco: Never Used  . Tobacco comment: per pt started smoking  age 64  Substance and Sexual Activity  . Alcohol use: No    Comment: 05/24/2018 "special occasions only"  . Drug use: Not Currently  . Sexual activity: Yes

## 2018-06-27 NOTE — Telephone Encounter (Signed)
[facility] - [name] states pt has nursing, PT and home health aid 2 x week, but since she has Medicaid pt is qualified for [place], through Medicaid pt would be evaluated for need of care and personnel would be sent for number of hours and tasks like cleaning house, running errands, personal care. [name] states the paperwork is to be completed by the physician and faxed to the number on the form, once reviewed in [place] the determination would be made.

## 2018-06-27 NOTE — Telephone Encounter (Signed)
Ok that's great. I'm glad she's getting assistance -Dr. Cannon Kettle

## 2018-06-27 NOTE — Telephone Encounter (Signed)
-----   Message from Landis Martins, [place] sent at 06/26/2018  2:23 PM EDT ----- Regarding: Home aide Do you know if we can assist with getting her a home aide? I know we've talked about her social situation before  -Dr Chauncey Cruel

## 2018-06-28 ENCOUNTER — Emergency Department (HOSPITAL_COMMUNITY)
Admission: EM | Admit: 2018-06-28 | Discharge: 2018-06-28 | Disposition: A | Discharge status: Home / Self Care | Payer: Medicare Other | Attending: Emergency Medicine | Admitting: Emergency Medicine

## 2018-06-28 ENCOUNTER — Emergency Department (HOSPITAL_COMMUNITY): Payer: Medicare Other

## 2018-06-28 ENCOUNTER — Other Ambulatory Visit: Payer: Self-pay

## 2018-06-28 ENCOUNTER — Encounter (HOSPITAL_COMMUNITY): Payer: Self-pay

## 2018-06-28 DIAGNOSIS — Z9119 Patient's noncompliance with other medical treatment and regimen: Secondary | ICD-10-CM | POA: Insufficient documentation

## 2018-06-28 DIAGNOSIS — Z79899 Other long term (current) drug therapy: Secondary | ICD-10-CM | POA: Insufficient documentation

## 2018-06-28 DIAGNOSIS — Z85038 Personal history of other malignant neoplasm of large intestine: Secondary | ICD-10-CM | POA: Diagnosis not present

## 2018-06-28 DIAGNOSIS — Z89511 Acquired absence of right leg below knee: Secondary | ICD-10-CM | POA: Diagnosis not present

## 2018-06-28 DIAGNOSIS — K5900 Constipation, unspecified: Secondary | ICD-10-CM | POA: Insufficient documentation

## 2018-06-28 DIAGNOSIS — E1151 Type 2 diabetes mellitus with diabetic peripheral angiopathy without gangrene: Secondary | ICD-10-CM | POA: Insufficient documentation

## 2018-06-28 DIAGNOSIS — R39198 Other difficulties with micturition: Secondary | ICD-10-CM | POA: Diagnosis not present

## 2018-06-28 DIAGNOSIS — I10 Essential (primary) hypertension: Secondary | ICD-10-CM | POA: Insufficient documentation

## 2018-06-28 DIAGNOSIS — F17210 Nicotine dependence, cigarettes, uncomplicated: Secondary | ICD-10-CM | POA: Insufficient documentation

## 2018-06-28 DIAGNOSIS — Z91040 Latex allergy status: Secondary | ICD-10-CM | POA: Insufficient documentation

## 2018-06-28 DIAGNOSIS — L089 Local infection of the skin and subcutaneous tissue, unspecified: Secondary | ICD-10-CM | POA: Insufficient documentation

## 2018-06-28 DIAGNOSIS — T148XXA Other injury of unspecified body region, initial encounter: Secondary | ICD-10-CM

## 2018-06-28 DIAGNOSIS — Z794 Long term (current) use of insulin: Secondary | ICD-10-CM | POA: Insufficient documentation

## 2018-06-28 LAB — CBC WITH DIFFERENTIAL/PLATELET
Basophils Absolute: 0 10*3/uL (ref 0.0–0.1)
Basophils Relative: 0 %
Eosinophils Absolute: 0.1 10*3/uL (ref 0.0–0.7)
Eosinophils Relative: 1 %
HCT: 41.5 % (ref 36.0–46.0)
Hemoglobin: 13.8 g/dL (ref 12.0–15.0)
Lymphocytes Relative: 11 %
Lymphs Abs: 1.7 10*3/uL (ref 0.7–4.0)
MCH: 30.6 pg (ref 26.0–34.0)
MCHC: 33.3 g/dL (ref 30.0–36.0)
MCV: 92 fL (ref 78.0–100.0)
Monocytes Absolute: 0.5 10*3/uL (ref 0.1–1.0)
Monocytes Relative: 3 %
Neutro Abs: 13.1 10*3/uL — ABNORMAL HIGH (ref 1.7–7.7)
Neutrophils Relative %: 85 %
Platelets: 261 10*3/uL (ref 150–400)
RBC: 4.51 MIL/uL (ref 3.87–5.11)
RDW: 13.9 % (ref 11.5–15.5)
WBC: 15.4 10*3/uL — ABNORMAL HIGH (ref 4.0–10.5)

## 2018-06-28 LAB — COMPREHENSIVE METABOLIC PANEL
ALT: 29 U/L (ref 0–44)
AST: 27 U/L (ref 15–41)
Albumin: 3.3 g/dL — ABNORMAL LOW (ref 3.5–5.0)
Alkaline Phosphatase: 92 U/L (ref 38–126)
Anion gap: 12 (ref 5–15)
BUN: 9 mg/dL (ref 6–20)
CO2: 28 mmol/L (ref 22–32)
Calcium: 9.1 mg/dL (ref 8.9–10.3)
Chloride: 95 mmol/L — ABNORMAL LOW (ref 98–111)
Creatinine, Ser: 0.69 mg/dL (ref 0.44–1.00)
GFR calc Af Amer: >60 mL/min (ref >60)
GFR calc non Af Amer: >60 mL/min (ref >60)
Glucose, Bld: 260 mg/dL — ABNORMAL HIGH (ref 70–99)
Potassium: 4.2 mmol/L (ref 3.5–5.1)
Sodium: 135 mmol/L (ref 135–145)
Total Bilirubin: 0.5 mg/dL (ref 0.3–1.2)
Total Protein: 6.8 g/dL (ref 6.5–8.1)

## 2018-06-28 LAB — URINALYSIS, ROUTINE W REFLEX MICROSCOPIC
Bacteria, UA: NONE SEEN
Bilirubin Urine: NEGATIVE
Glucose, UA: >=500 mg/dL — AB
Hgb urine dipstick: NEGATIVE
Ketones, ur: 20 mg/dL — AB
Nitrite: NEGATIVE
Protein, ur: NEGATIVE mg/dL
Specific Gravity, Urine: 1.023 (ref 1.005–1.030)
pH: 7 (ref 5.0–8.0)

## 2018-06-28 LAB — POC OCCULT BLOOD, ED: FECAL OCCULT BLD: POSITIVE — AB

## 2018-06-28 LAB — I-STAT CG4 LACTIC ACID, ED: Lactic Acid, Venous: 1.29 mmol/L (ref 0.5–1.9)

## 2018-06-28 LAB — CBG MONITORING, ED: GLUCOSE-CAPILLARY: 270 mg/dL — AB (ref 70–99)

## 2018-06-28 MED ORDER — DOXYCYCLINE HYCLATE 100 MG PO CAPS: 100.0000 mg | ORAL_CAPSULE | Freq: Two times a day (BID) | ORAL | 0 refills | Status: DC

## 2018-06-28 MED ORDER — CEPHALEXIN 500 MG PO CAPS: 500.0000 mg | ORAL_CAPSULE | Freq: Four times a day (QID) | ORAL | 0 refills | Status: DC

## 2018-06-28 MED ORDER — DOXYCYCLINE HYCLATE 100 MG PO TABS
100.0000 mg | ORAL_TABLET | Freq: Once | ORAL | Status: AC
  Administered 2018-06-28: 100 mg via ORAL
  Filled 2018-06-28: qty 1

## 2018-06-28 MED ORDER — SODIUM CHLORIDE 0.9 % IV BOLUS
500.0000 mL | Freq: Once | INTRAVENOUS | Status: AC
  Administered 2018-06-28: 500 mL via INTRAVENOUS

## 2018-06-28 MED ORDER — CEPHALEXIN 500 MG PO CAPS
500.0000 mg | ORAL_CAPSULE | Freq: Once | ORAL | Status: AC
  Administered 2018-06-28: 500 mg via ORAL
  Filled 2018-06-28: qty 1

## 2018-06-28 NOTE — ED Provider Notes (Signed)
[facility] DEPT Provider Note   CSN: [id number] Arrival date & time: 06/28/18  1358     History   Chief Complaint Chief Complaint  Patient presents with  . Constipation    HPI [name] is a 52 y.o. female.  HPI  Ms. Villers is a 52 year old female with a history of hypertension, hyperlipidemia, type 2 diabetes (poorly compliant on medication), COPD, CVA, depression, obstructive sleep apnea not on CPAP, restless leg syndrome, stage I colon cancer status post colectomy, right transtibial amputation who presents to the emergency department via EMS for evaluation of multiple complaints.  Patient states that she did not have a bowel movement for the entire month of May.  Today she called EMS because she was having trouble passing a bowel movement, tried to pull the stool out of her rectum manually.  Eventually she coughed and was able to pass a large amount of stool.  She states that there was small streaking of bright red blood.  She now reports mild soreness over her rectum which is worse with sitting.  She has not been taking any of her medications including laxatives for the past several weeks because she does not like shots and forgets to take her pills.  She denies abdominal pain, melena, nausea/vomiting, obstipation.  She also reports that she was having trouble urinating today, was only able to go after she had coughed several times.  She denies dysuria, fever or flank pain.  Patient also reports that she noticed a blister on her left wrist and elbow, this came about after falling out of bed several days ago.  She states that she noticed some swelling over the blister.  Reports 7/10 severity constant pain over the wound on her wrist and elbow.  Has not taken any medication for pain.  She did recently have an appointment with her PCP and is waiting on a call from home health to help her.  She uses a wheelchair at home given her recent transtibial amputation.   She denies chest pain, shortness of breath, lightheadedness, syncope.  Past Medical History:  Diagnosis Date  . Anxiety   . Arthritis   . Bipolar disorder ([place])   . Broken jaw ([place])   . Broken wrist   . Chronic back pain   . Claudication ([place])    a. 12/2013 ABI's: R 0.97, L 0.94.  [name] COPD (chronic obstructive pulmonary disease) ([place])   . Depression   . Diabetic Charcot's foot ([place])   . Diabetic foot ulcer (HCC)    chronic left foot ulcer , great toe/  hx recurrent foot ulcer bilaterally  . DJD (degenerative joint disease)   . Edema of both lower extremities   . Episode of memory loss   . GERD (gastroesophageal reflux disease)   . Headache(784.0)   . Hiatal hernia   . History of colon cancer, stage I dx 02-26-2015--- oncologist-- dr [name]--- per last note no recurrence   05-16-2015 s/p  Laparoscopic sigmoid colectomy w/ node bx's (negative nodes per path)  Stage I (pT1,N0,M0) Grade 2  . 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   per MRI multiple small strokes post cardiac cath. --  residual mild memory loss  . History of methicillin resistant staphylococcus aureus (MRSA)   . Hypercholesterolemia   . Insomnia   . Insulin dependent type 2 diabetes mellitus, uncontrolled ([place]) dx 2004   endocrinologist-  dr Dwyane Dee-- last A1c 8.8 in Aug2018:  pt is noncompliant w/ diet, states does note eat breakfast , her first main meal in afternoon  . Neurogenic bladder   . Neuropathy, diabetic ([place])    hands and feet  . OSA (obstructive sleep apnea)    cpap intolerant  . Personality disorder ([place])   . Restless leg syndrome   . SOB (shortness of breath) on exertion   . Stroke ([place])   . SUI (stress urinary incontinence, female) S/P SLING 12-29-2011    Patient Active Problem List   Diagnosis Date Noted  . UTI (urinary tract infection) 05/23/2018  . Acute metabolic encephalopathy 27/78/2423  . Toxic encephalopathy 05/09/2018  . GERD (gastroesophageal reflux  disease) 05/09/2018  . Tobacco abuse 05/09/2018  . Polypharmacy 05/09/2018  . Restless leg syndrome 04/27/2018  . Anxiety 04/27/2018  . Hyponatremia 04/26/2018  . Noncompliance with dietary restriction 04/26/2018  . Postoperative cellulitis of surgical wound 04/26/2018  . At high risk for falls 04/24/2018  . Noncompliance with safety precautions 04/24/2018  . Leukocytosis 04/24/2018  . 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 PAD (peripheral artery disease) ([place])   . Poorly controlled type 2 diabetes mellitus ([place])   . Subacute osteomyelitis, right ankle and foot ([place])   . Major depressive disorder, recurrent episode ([place]) 04/11/2018  . Diabetic ulcer of right midfoot associated with type 2 diabetes mellitus, with necrosis of bone ([place])   . Abscess of ankle   . Infectious synovitis   . Diabetic foot infection ([place])   . Sepsis ([place]) 04/09/2018  . Cancer of sigmoid colon ([place]) 03/09/2015  . OSA (obstructive sleep apnea) 07/04/2014  . Migraine without aura, with intractable migraine, so stated, without mention of status migrainosus 03/26/2014  . Peripheral neuropathy 03/03/2014  . Abnormal stress test 02/12/2014  . CVA (cerebral infarction) 02/12/2014  . Chest pain   . Abnormal heart rhythm   . Edema   . Hypertension   . Hyperlipemia   . Hypercholesterolemia   . Other and unspecified hyperlipidemia 11/14/2013  . Type II diabetes mellitus, uncontrolled ([place]) 07/22/2013  . Essential hypertension 07/22/2013  . Diabetic neuropathy with neurologic complication ([place]) 53/61/4431  . Diabetic polyneuropathy ([place]) 07/22/2013    Past Surgical History:  Procedure Laterality Date  . AMPUTATION Right 04/13/2018   Procedure: RIGHT BELOW KNEE AMPUTATION;  Surgeon: Newt Minion, MD;  Location: [facility];  Service: Orthopedics;  Laterality: Right;  . ANTERIOR CERVICAL DECOMP/DISCECTOMY FUSION  2000   C5 - 7  . BACK SURGERY    . CARDIAC CATHETERIZATION  05-22-2008   DR SKAINS   NO  SIGNIFECANT CAD/ NORMAL LV/  EF 65%/  NO WALL MOTION ABNORMALITIES  . CARPAL TUNNEL RELEASE Right 04-25-2013  . [place]; 1992  . COLON SURGERY    . COLONOSCOPY    . CYSTO N/A 04/30/2013   Procedure: CYSTOSCOPY;  Surgeon: Reece Packer, MD;  Location: [facility];  Service: Urology;  Laterality: N/A;  . CYSTOSCOPY MACROPLASTIQUE IMPLANT N/A 02/06/2018   Procedure: CYSTOSCOPY MACROPLASTIQUE IMPLANT;  Surgeon: Bjorn Loser, MD;  Location: [facility];  Service: Urology;  Laterality: N/A;  . CYSTOSCOPY WITH INJECTION  05/04/2012   Procedure: CYSTOSCOPY WITH INJECTION;  Surgeon: Reece Packer, MD;  Location: [facility];  Service: Urology;  Laterality: N/A;  MACROPLASTIQUE INJECTION  . CYSTOSCOPY WITH INJECTION  08/28/2012   Procedure: CYSTOSCOPY WITH INJECTION;  Surgeon: Reece Packer, MD;  Location: [facility];  Service: Urology;  Laterality: N/A;  cysto and macroplastique   . FOOT SURGERY Bilateral    "related to [place]"  . HERNIA REPAIR  ?1996   "stomach"  . INCISION AND DRAINAGE OF WOUND Right 04/11/2018   Procedure: IRRIGATION AND DEBRIDEMENT WOUND right foot and right ankle;  Surgeon: Evelina Bucy, DPM;  Location: [facility];  Service: Podiatry;  Laterality: Right;  . KNEE ARTHROSCOPY Left   . KNEE ARTHROSCOPY W/ ALLOGRAFT IMPANT Left    graft x 2  . KNEE SURGERY     TOTAL 8 SURG'S  . LAPAROSCOPIC SIGMOID COLECTOMY N/A 04/22/2015   Procedure: LAPAROSCOPIC HAND ASSISTED SIGMOID COLECTOMY;  Surgeon: Erroll Luna, MD;  Location: [facility];  Service: General;  Laterality: N/A;  . LEFT HEART CATHETERIZATION WITH CORONARY ANGIOGRAM N/A 02/12/2014   Procedure: LEFT HEART CATHETERIZATION WITH CORONARY ANGIOGRAM;  Surgeon: Candee Furbish, MD;  Location: [facility];  Service: Cardiovascular;  Laterality: N/A;   No angiographically significant CAD; normal LVSF, LVEDP 52mmHg,  EF 55% (new finding ef 30% myoview 01-08-2014)  . LUMBAR  FUSION    . MANDIBLE FRACTURE SURGERY    . MULTIPLE LAPAROSCOPIES FOR ENDOMETRIOSIS    . PUBOVAGINAL SLING  12/29/2011   Procedure: Gaynelle Arabian;  Surgeon: Reece Packer, MD;  Location: [facility];  Service: Urology;  Laterality: N/A;  cysto and sparc sling   . PUBOVAGINAL SLING N/A 04/30/2013   Procedure: REMOVAL OF VAGINAL MESH;  Surgeon: Reece Packer, MD;  [facility];  Service: Urology;  Laterality: N/A;  . RECONSTURCTION OF CONGENITAL UTERUS ANOMALY  1983  . REPEAT RECONSTRUCTION ACL LEFT KNEE/ SCREWS REMOVED  03-28-2000   CADAVER GRAFT  . TOTAL ABDOMINAL HYSTERECTOMY  1997   w/BSO  . TRANSTHORACIC ECHOCARDIOGRAM  02/13/2014   ef 45%, hypokinesis base inferior and base inferolateral walls  . UPPER GI ENDOSCOPY    . WOUND DEBRIDEMENT Right 09/05/2016   Procedure: DEBRIDEMENT WOUND WITH GRAFT RIGHT FOOT;  Surgeon: Landis Martins, DPM;  Location: [facility];  Service: Podiatry;  Laterality: Right;  . WRIST FRACTURE SURGERY       OB History   None      Home Medications    Prior to Admission medications   Medication Sig Start Date End Date Taking? Authorizing Provider  aspirin-acetaminophen-caffeine (EXCEDRIN MIGRAINE) [phone] MG tablet Take 2 tablets by mouth every 6 (six) hours as needed for headache.    [provider]  bisacodyl (DULCOLAX) 5 MG EC tablet Take 1 tablet (5 mg total) by mouth daily as needed for moderate constipation. 04/16/18   Elgergawy, Silver Huguenin, MD  cyclobenzaprine (FLEXERIL) 10 MG tablet Take 10 mg by mouth 3 (three) times daily. 05/18/18   [provider]  empagliflozin (JARDIANCE) 25 MG TABS tablet Take 25 mg by mouth daily.    [provider]  gabapentin (NEURONTIN) 600 MG tablet Take 1 tablet (600 mg total) by mouth 2 (two) times daily. Take in the morning and afternoon. Do not take at night time. 05/10/18   Mariel Aloe, MD  HETLIOZ 20 MG CAPS Take 20 mg by mouth at bedtime. 05/02/18   [provider]  INGREZZA 80 MG CAPS Take 80 mg by mouth at bedtime. 05/01/18   [provider]  insulin aspart (NOVOLOG) 100 UNIT/ML injection Inject 10-35 Units into the skin 3 (three) times daily with meals. Take 10 units before your main meal. If blood sugars are consistently over 250 after the meal start taking 14 units before  your main meal. 35 UNITS AT SUPPER    [provider]  Insulin Glargine (BASAGLAR KWIKPEN) 100 UNIT/ML SOPN Inject 36 Units into the skin at bedtime.    [provider]  lidocaine (LIDODERM) 5 % Place 1 patch onto the skin daily as needed (pain). Remove & Discard patch within 12 hours or as directed by MD 02/27/18   Landis Martins, DPM  mineral oil-hydrophilic petrolatum (AQUAPHOR) ointment Apply topically as needed for dry skin. 05/29/18   Landis Martins, DPM  nortriptyline (PAMELOR) 10 MG capsule Take 10 mg by mouth at bedtime. 05/17/18   [provider]  ondansetron (ZOFRAN) 4 MG tablet Take 1 tablet (4 mg total) by mouth every 6 (six) hours as needed for nausea. 04/16/18   Elgergawy, Silver Huguenin, MD  Oxycodone HCl 10 MG TABS Take 10 mg by mouth every 6 (six) hours as needed for severe pain.    [provider]  oxyCODONE-acetaminophen (PERCOCET) 10-325 MG tablet Take 1 tablet by mouth every 8 (eight) hours as needed for pain. 05/03/18   Newt Minion, MD  pantoprazole (PROTONIX) 40 MG tablet Take 1 tablet (40 mg total) by mouth daily. 04/27/18   Love, Ivan Anchors, PA-C  QUEtiapine (SEROQUEL) 50 MG tablet Take 3 tablets (150 mg total) by mouth at bedtime. 05/25/18 06/24/18  Amin, Jeanella Flattery, MD  tamsulosin (FLOMAX) 0.4 MG CAPS capsule Take 0.4 mg by mouth daily. 05/17/18   [provider]    Family History Family History  Problem Relation Age of Onset  . Hypertension Mother   . Diabetes Mother   . Cancer - Other Mother        lymphoma   . Cancer - Other Father        lung, bladder cancer   . Heart attack Father   . Cancer -  Other Brother        bladder cancer     Social History Social History   Tobacco Use  . Smoking status: Current Every Day Smoker    Packs/day: 1.50    Years: 48.00    Pack years: 72.00    Types: Cigarettes  . Smokeless tobacco: Never Used  . Tobacco comment: per pt started smoking  age 71  Substance Use Topics  . Alcohol use: No    Comment: 05/24/2018 "special occasions only"  . Drug use: Not Currently     Allergies   Latex; Sweetening enhancer [flavoring agent]; Aspartame and phenylalanine; Ibuprofen; Trazodone and nefazodone; and Triazolam   Review of Systems Review of Systems  Constitutional: Negative for chills and fever.  Eyes: Negative for visual disturbance.  Respiratory: Negative for shortness of breath.   Cardiovascular: Negative for chest pain.  Gastrointestinal: Positive for blood in stool and constipation. Negative for abdominal distention, abdominal pain, diarrhea, nausea and vomiting.  Genitourinary: Positive for difficulty urinating. Negative for dysuria, frequency and pelvic pain.  Musculoskeletal: Positive for gait problem (wheelchair bound right lower leg amputation). Negative for back pain.  Skin: Positive for color change and wound (left wrist and elbow).  Neurological: Negative for weakness and light-headedness.  Psychiatric/Behavioral: Negative for agitation.     Physical Exam Updated Vital Signs BP 119/67   Pulse 86   Temp 98.1 F (36.7 C) (Oral)   Resp 14   Ht 5\' 5"  (1.651 m)   Wt 81.2 kg (179 lb)   SpO2 99%   BMI 29.79 kg/m   Physical Exam  Constitutional: She is oriented to person, place, and  time. She appears well-developed and well-nourished. No distress.  Sitting at bedside in no apparent distress, nontoxic-appearing.  HENT:  Head: Normocephalic and atraumatic.  Mouth/Throat: Oropharynx is clear and moist. No oropharyngeal exudate.  Eyes: Pupils are equal, round, and reactive to light. Conjunctivae are normal. Right eye exhibits no  discharge. Left eye exhibits no discharge.  Neck: Normal range of motion. Neck supple.  Cardiovascular: Normal rate, regular rhythm and intact distal pulses.  Distant heart sounds.  No appreciable murmur.  Pulmonary/Chest: Effort normal and breath sounds normal. No stridor. No respiratory distress. She has no wheezes. She has no rales.  Abdominal:  Abdomen soft and nondistended.  Mild tenderness with deep palpation of the lower abdomen.  No guarding or rigidity.  No rebound tenderness.  Genitourinary:  Genitourinary Comments: Chaperone present for exam. No gross blood noted on rectal exam. Several skin tags near rectum. No fissure or external hemorrhoids. Normal tone.  Musculoskeletal:  Left ulnar aspect of wrist with 2cm x 1 cm wound with surrounding erythema. No induration. No overlying tenderness. Full active wrist ROM. Left elbow with 2cm x 2cm wound and mild surrounding erythema which is mildly tender to palpation. Full elbow ROM. Grip strength 5/5. Radial pulses 2+ and symmetric bilaterally. Sensation to light touch intact in bilateral UE.   Neurological: She is alert and oriented to person, place, and time. Coordination normal.  Skin: Skin is warm and dry. She is not diaphoretic.  Psychiatric: She has a normal mood and affect. Her behavior is normal.  Nursing note and vitals reviewed.        ED Treatments / Results  Labs (all labs ordered are listed, but only abnormal results are displayed) Labs Reviewed  URINALYSIS, ROUTINE W REFLEX MICROSCOPIC - Abnormal; Notable for the following components:      Result Value   Color, Urine STRAW (*)    Glucose, UA >=500 (*)    Ketones, ur 20 (*)    Leukocytes, UA SMALL (*)    All other components within normal limits  CBC WITH DIFFERENTIAL/PLATELET - Abnormal; Notable for the following components:   WBC 15.4 (*)    Neutro Abs 13.1 (*)    All other components within normal limits  COMPREHENSIVE METABOLIC PANEL - Abnormal; Notable for  the following components:   Chloride 95 (*)    Glucose, Bld 260 (*)    Albumin 3.3 (*)    All other components within normal limits  CBG MONITORING, ED - Abnormal; Notable for the following components:   Glucose-Capillary 270 (*)    All other components within normal limits  POC OCCULT BLOOD, ED - Abnormal; Notable for the following components:   Fecal Occult Bld POSITIVE (*)    All other components within normal limits  URINE CULTURE  I-STAT CG4 LACTIC ACID, ED    EKG None  Radiology Dg Elbow Complete Left  Result Date: 06/28/2018 CLINICAL DATA:  Left posterior elbow and wrist pain after fall out of bed. EXAM: LEFT ELBOW - COMPLETE 3+ VIEW COMPARISON:  None. FINDINGS: There is no evidence of fracture, dislocation, or joint effusion. There is no evidence of arthropathy or other focal bone abnormality. Soft tissues are unremarkable. IMPRESSION: Negative. Electronically Signed   By: Marin Olp M.D.   On: 06/28/2018 16:06   Dg Wrist Complete Left  Result Date: 06/28/2018 CLINICAL DATA:  Fall out of bed with left wrist pain. EXAM: LEFT WRIST - COMPLETE 3+ VIEW COMPARISON:  None. FINDINGS: No evidence of acute fracture  or dislocation. Small well corticated chronic fragment adjacent the lateral aspect of the scaphoid. Mild degenerate changes of the radial aspect of the carpal bones and first carpometacarpal joint. Subtle ulnar plus variance. IMPRESSION: No acute findings. Electronically Signed   By: Marin Olp M.D.   On: 06/28/2018 16:08   Dg Abd 2 Views  Result Date: 06/28/2018 CLINICAL DATA:  Possible constipation. EXAM: ABDOMEN - 2 VIEW COMPARISON:  None. FINDINGS: Bowel gas pattern is nonobstructive. No evidence of free peritoneal air. No mass or mass effect. Mild degenerative change of the spine with interbody fusion at the L4-5 level. IMPRESSION: Nonobstructive bowel gas pattern. Electronically Signed   By: Marin Olp M.D.   On: 06/28/2018 16:10    Procedures Procedures  (including critical care time)  Medications Ordered in ED Medications  sodium chloride 0.9 % bolus 500 mL (0 mLs Intravenous Stopped 06/28/18 1633)  cephALEXin (KEFLEX) capsule 500 mg (500 mg Oral Given 06/28/18 1825)  doxycycline (VIBRA-TABS) tablet 100 mg (100 mg Oral Given 06/28/18 1825)     Initial Impression / Assessment and Plan / ED Course  I have reviewed the triage vital signs and the nursing notes.  Pertinent labs & imaging results that were available during my care of the patient were reviewed by me and considered in my medical decision making (see chart for details).    Patient presents with multiple complaints  Wound on left wrist and left elbow She is afebrile and non-toxic appearing. Wounds have granulation tissue and appear chronic. There is with mild surrounding erythema and tenderness. She has a leukocytosis with WBC 15.4 and shift to left NEUT 13.1. Lactic acid WNL.  Xray left wrist and elbow without acute abnormality.  No evidence of osteomyelitis or subcutaneous gas.  Plan to cover her with Keflex and doxycycline given increased risk of infection given her unmanaged diabetes (BG 260 today) and she was on antibiotics a month ago.  Discussed this with Dr. Zenia Resides who agrees.  Counseled her to follow-up with her PCP in 72 hours for recheck.  Also discussed reasons to return immediately and she agrees and voiced understanding.  Constipation No impaction or gross blood on rectal exam. Hgb WNL, no anemia.  No gross blood on rectal exam.  The streaks of blood that she describes in her stool are likely related to internal hemorrhoids in the setting of constipation. Abd x-ray without obstruction.  No abdominal tenderness on exam and do not feel that further imaging is indicated at this time.  Plan to start patient on daily MiriLAX.   Urinary retention She has a history of neurogenic bladder.  States that starting today she has been having difficulty urinating, only voided once today.  Initial bedside bladder scan with >1062mL. She was able to void independently with post void residual 669mL.  Patient attempted to void again and had 267mL residual.  UA does not appear infected.  Discussed this patient with Dr. Zenia Resides who suggests Foley catheter placement and follow-up with urology.  Patient already has a urologist whom she is established with.  She has been counseled to follow-up in 5 days for Foley catheter removal and voiding trial.  Final Clinical Impressions(s) / ED Diagnoses   Final diagnoses:  Constipation  Wound infection    ED Discharge Orders        Ordered    cephALEXin (KEFLEX) 500 MG capsule  4 times daily     06/28/18 1812    doxycycline (VIBRAMYCIN) 100 MG capsule  2  times daily     06/28/18 1812       Bernarda Caffey 06/28/18 2037    Lacretia Leigh, MD 07/01/18 1736

## 2018-06-28 NOTE — ED Notes (Signed)
rx x 2 and foley intact and draining

## 2018-06-28 NOTE — Discharge Instructions (Addendum)
Please take both antibiotics as prescribed until they are completely gone.  Follow-up with your regular doctor in 2 days for recheck of your wounds.  Please schedule an appointment with your urologist in 5 days to have your catheter removed.  Return to the ER if you have any new or concerning symptoms like fever, worsening redness or swelling over your wounds, abdominal pain or vomiting.

## 2018-06-28 NOTE — ED Triage Notes (Addendum)
Pt BIB EMS from home c/o constipation x1 month. Denies any urge to use the restroom the entire month of may until today. Pt did have a bowel movement, states it was large and bloody.  Pt also c/o wound to left wrist after fall. Wound noted to left wrist + swollen. Hx diabetes.

## 2018-06-28 NOTE — ED Notes (Signed)
AFTER PATIENT VOIDED BLADDER  260mL's SHE WAS BLADDER SCANED RESULTED 265mL's REMAINING IN BLADDER

## 2018-06-28 NOTE — ED Notes (Signed)
CBG 270 EDPA EMILY AT BEDSIDE

## 2018-06-28 NOTE — ED Notes (Signed)
Bed: YT11 Expected date:  Expected time:  Means of arrival:  Comments: constipation

## 2018-06-28 NOTE — ED Notes (Addendum)
EDPA EMILY Provider at bedside. MADE AWARE PT IS REQUESTING SOMETHING TO EAT, PAIN MEDICATIONS, PLAN OF CARE, AWARE OF NEED FOR URINE.

## 2018-06-29 LAB — URINE CULTURE: Culture: <10000 — AB

## 2018-07-03 ENCOUNTER — Ambulatory Visit (INDEPENDENT_AMBULATORY_CARE_PROVIDER_SITE_OTHER): Payer: Medicare Other | Admitting: Orthopedic Surgery

## 2018-07-03 ENCOUNTER — Emergency Department (HOSPITAL_COMMUNITY): Payer: Medicare Other

## 2018-07-03 ENCOUNTER — Other Ambulatory Visit: Payer: Self-pay

## 2018-07-03 ENCOUNTER — Inpatient Hospital Stay (HOSPITAL_COMMUNITY)
Admission: EM | Admit: 2018-07-03 | Discharge: 2018-07-05 | DRG: 872 | Disposition: A | Discharge status: Home / Self Care | Payer: Medicare Other | Attending: Internal Medicine | Admitting: Internal Medicine

## 2018-07-03 ENCOUNTER — Encounter (INDEPENDENT_AMBULATORY_CARE_PROVIDER_SITE_OTHER): Payer: Self-pay | Admitting: Orthopedic Surgery

## 2018-07-03 ENCOUNTER — Encounter (HOSPITAL_COMMUNITY): Payer: Self-pay | Admitting: Emergency Medicine

## 2018-07-03 VITALS — Ht 65.0 in | Wt 179.0 lb

## 2018-07-03 DIAGNOSIS — G4700 Insomnia, unspecified: Secondary | ICD-10-CM | POA: Diagnosis present

## 2018-07-03 DIAGNOSIS — J449 Chronic obstructive pulmonary disease, unspecified: Secondary | ICD-10-CM | POA: Diagnosis present

## 2018-07-03 DIAGNOSIS — L089 Local infection of the skin and subcutaneous tissue, unspecified: Secondary | ICD-10-CM | POA: Diagnosis not present

## 2018-07-03 DIAGNOSIS — Z89511 Acquired absence of right leg below knee: Secondary | ICD-10-CM

## 2018-07-03 DIAGNOSIS — A480 Gas gangrene: Secondary | ICD-10-CM | POA: Diagnosis not present

## 2018-07-03 DIAGNOSIS — K219 Gastro-esophageal reflux disease without esophagitis: Secondary | ICD-10-CM | POA: Diagnosis present

## 2018-07-03 DIAGNOSIS — L03114 Cellulitis of left upper limb: Secondary | ICD-10-CM | POA: Diagnosis present

## 2018-07-03 DIAGNOSIS — E11628 Type 2 diabetes mellitus with other skin complications: Secondary | ICD-10-CM | POA: Diagnosis not present

## 2018-07-03 DIAGNOSIS — G8929 Other chronic pain: Secondary | ICD-10-CM | POA: Diagnosis present

## 2018-07-03 DIAGNOSIS — N319 Neuromuscular dysfunction of bladder, unspecified: Secondary | ICD-10-CM | POA: Diagnosis present

## 2018-07-03 DIAGNOSIS — I4581 Long QT syndrome: Secondary | ICD-10-CM | POA: Diagnosis present

## 2018-07-03 DIAGNOSIS — F319 Bipolar disorder, unspecified: Secondary | ICD-10-CM | POA: Diagnosis present

## 2018-07-03 DIAGNOSIS — Z85038 Personal history of other malignant neoplasm of large intestine: Secondary | ICD-10-CM

## 2018-07-03 DIAGNOSIS — G4733 Obstructive sleep apnea (adult) (pediatric): Secondary | ICD-10-CM | POA: Diagnosis present

## 2018-07-03 DIAGNOSIS — Z9111 Patient's noncompliance with dietary regimen: Secondary | ICD-10-CM | POA: Diagnosis not present

## 2018-07-03 DIAGNOSIS — Z91018 Allergy to other foods: Secondary | ICD-10-CM

## 2018-07-03 DIAGNOSIS — Z9119 Patient's noncompliance with other medical treatment and regimen: Secondary | ICD-10-CM

## 2018-07-03 DIAGNOSIS — E1140 Type 2 diabetes mellitus with diabetic neuropathy, unspecified: Secondary | ICD-10-CM | POA: Diagnosis present

## 2018-07-03 DIAGNOSIS — Z79899 Other long term (current) drug therapy: Secondary | ICD-10-CM

## 2018-07-03 DIAGNOSIS — R339 Retention of urine, unspecified: Secondary | ICD-10-CM | POA: Diagnosis present

## 2018-07-03 DIAGNOSIS — E1165 Type 2 diabetes mellitus with hyperglycemia: Secondary | ICD-10-CM | POA: Diagnosis present

## 2018-07-03 DIAGNOSIS — F419 Anxiety disorder, unspecified: Secondary | ICD-10-CM | POA: Diagnosis present

## 2018-07-03 DIAGNOSIS — F339 Major depressive disorder, recurrent, unspecified: Secondary | ICD-10-CM | POA: Diagnosis not present

## 2018-07-03 DIAGNOSIS — A419 Sepsis, unspecified organism: Secondary | ICD-10-CM | POA: Diagnosis present

## 2018-07-03 DIAGNOSIS — F609 Personality disorder, unspecified: Secondary | ICD-10-CM | POA: Diagnosis present

## 2018-07-03 DIAGNOSIS — E872 Acidosis: Secondary | ICD-10-CM | POA: Diagnosis present

## 2018-07-03 DIAGNOSIS — Z9114 Patient's other noncompliance with medication regimen: Secondary | ICD-10-CM | POA: Diagnosis not present

## 2018-07-03 DIAGNOSIS — Z91040 Latex allergy status: Secondary | ICD-10-CM

## 2018-07-03 DIAGNOSIS — E11610 Type 2 diabetes mellitus with diabetic neuropathic arthropathy: Secondary | ICD-10-CM | POA: Diagnosis present

## 2018-07-03 DIAGNOSIS — Z8673 Personal history of transient ischemic attack (TIA), and cerebral infarction without residual deficits: Secondary | ICD-10-CM

## 2018-07-03 DIAGNOSIS — C189 Malignant neoplasm of colon, unspecified: Secondary | ICD-10-CM | POA: Diagnosis not present

## 2018-07-03 DIAGNOSIS — E7800 Pure hypercholesterolemia, unspecified: Secondary | ICD-10-CM | POA: Diagnosis present

## 2018-07-03 DIAGNOSIS — G2581 Restless legs syndrome: Secondary | ICD-10-CM | POA: Diagnosis present

## 2018-07-03 DIAGNOSIS — F17210 Nicotine dependence, cigarettes, uncomplicated: Secondary | ICD-10-CM | POA: Diagnosis present

## 2018-07-03 DIAGNOSIS — Z7982 Long term (current) use of aspirin: Secondary | ICD-10-CM

## 2018-07-03 DIAGNOSIS — Z888 Allergy status to other drugs, medicaments and biological substances status: Secondary | ICD-10-CM

## 2018-07-03 DIAGNOSIS — Z960 Presence of urogenital implants: Secondary | ICD-10-CM | POA: Diagnosis not present

## 2018-07-03 DIAGNOSIS — M1990 Unspecified osteoarthritis, unspecified site: Secondary | ICD-10-CM | POA: Diagnosis present

## 2018-07-03 DIAGNOSIS — M549 Dorsalgia, unspecified: Secondary | ICD-10-CM | POA: Diagnosis present

## 2018-07-03 DIAGNOSIS — E1142 Type 2 diabetes mellitus with diabetic polyneuropathy: Secondary | ICD-10-CM | POA: Diagnosis not present

## 2018-07-03 DIAGNOSIS — R6520 Severe sepsis without septic shock: Secondary | ICD-10-CM | POA: Diagnosis present

## 2018-07-03 DIAGNOSIS — Z794 Long term (current) use of insulin: Secondary | ICD-10-CM

## 2018-07-03 LAB — CBC WITH DIFFERENTIAL/PLATELET
ABS IMMATURE GRANULOCYTES: 0.1 10*3/uL (ref 0.0–0.1)
BASOS ABS: 0.1 10*3/uL (ref 0.0–0.1)
Basophils Relative: 1 %
Eosinophils Absolute: 0.2 10*3/uL (ref 0.0–0.7)
Eosinophils Relative: 1 %
HCT: 46.1 % — ABNORMAL HIGH (ref 36.0–46.0)
HEMOGLOBIN: 15.2 g/dL — AB (ref 12.0–15.0)
Immature Granulocytes: 1 %
LYMPHS PCT: 24 %
Lymphs Abs: 4.5 10*3/uL — ABNORMAL HIGH (ref 0.7–4.0)
MCH: 29.9 pg (ref 26.0–34.0)
MCHC: 33 g/dL (ref 30.0–36.0)
MCV: 90.7 fL (ref 78.0–100.0)
MONO ABS: 0.7 10*3/uL (ref 0.1–1.0)
Monocytes Relative: 4 %
NEUTROS ABS: 12.8 10*3/uL — AB (ref 1.7–7.7)
NEUTROS PCT: 69 %
Platelets: 336 10*3/uL (ref 150–400)
RBC: 5.08 MIL/uL (ref 3.87–5.11)
RDW: 13.5 % (ref 11.5–15.5)
WBC: 18.4 10*3/uL — AB (ref 4.0–10.5)

## 2018-07-03 LAB — COMPREHENSIVE METABOLIC PANEL
ALBUMIN: 3.3 g/dL — AB (ref 3.5–5.0)
ALK PHOS: 97 U/L (ref 38–126)
ALT: 17 U/L (ref 0–44)
AST: 21 U/L (ref 15–41)
Anion gap: 13 (ref 5–15)
BILIRUBIN TOTAL: 0.4 mg/dL (ref 0.3–1.2)
BUN: 6 mg/dL (ref 6–20)
CALCIUM: 9.3 mg/dL (ref 8.9–10.3)
CO2: 22 mmol/L (ref 22–32)
CREATININE: 0.56 mg/dL (ref 0.44–1.00)
Chloride: 101 mmol/L (ref 98–111)
Glucose, Bld: 157 mg/dL — ABNORMAL HIGH (ref 70–99)
Potassium: 3.3 mmol/L — ABNORMAL LOW (ref 3.5–5.1)
SODIUM: 136 mmol/L (ref 135–145)
TOTAL PROTEIN: 6.5 g/dL (ref 6.5–8.1)

## 2018-07-03 LAB — URINALYSIS, ROUTINE W REFLEX MICROSCOPIC
Bacteria, UA: NONE SEEN
Bilirubin Urine: NEGATIVE
Ketones, ur: NEGATIVE mg/dL
Leukocytes, UA: NEGATIVE
NITRITE: NEGATIVE
Protein, ur: NEGATIVE mg/dL
SPECIFIC GRAVITY, URINE: 1.007 (ref 1.005–1.030)
pH: 7 (ref 5.0–8.0)

## 2018-07-03 LAB — I-STAT CG4 LACTIC ACID, ED
LACTIC ACID, VENOUS: 4.05 mmol/L — AB (ref 0.5–1.9)
Lactic Acid, Venous: 2.08 mmol/L (ref 0.5–1.9)

## 2018-07-03 LAB — GLUCOSE, CAPILLARY
Glucose-Capillary: 366 mg/dL — ABNORMAL HIGH (ref 70–99)
Glucose-Capillary: 390 mg/dL — ABNORMAL HIGH (ref 70–99)

## 2018-07-03 LAB — MRSA PCR SCREENING: MRSA by PCR: POSITIVE — AB

## 2018-07-03 LAB — LACTIC ACID, PLASMA
LACTIC ACID, VENOUS: 2.2 mmol/L — AB (ref 0.5–1.9)
Lactic Acid, Venous: 2.2 mmol/L (ref 0.5–1.9)

## 2018-07-03 MED ORDER — VANCOMYCIN HCL IN DEXTROSE 1-5 GM/200ML-% IV SOLN
1000.0000 mg | Freq: Two times a day (BID) | INTRAVENOUS | Status: DC
  Administered 2018-07-04 – 2018-07-05 (×3): 1000 mg via INTRAVENOUS
  Filled 2018-07-03 (×4): qty 200

## 2018-07-03 MED ORDER — PIPERACILLIN-TAZOBACTAM 3.375 G IVPB 30 MIN
3.3750 g | Freq: Once | INTRAVENOUS | Status: AC
  Administered 2018-07-03: 3.375 g via INTRAVENOUS
  Filled 2018-07-03: qty 50

## 2018-07-03 MED ORDER — ONDANSETRON HCL 4 MG/2ML IJ SOLN: 4.0000 mg | Freq: Four times a day (QID) | INTRAMUSCULAR | Status: DC | PRN

## 2018-07-03 MED ORDER — POLYETHYLENE GLYCOL 3350 17 G PO PACK: 17.0000 g | PACK | Freq: Every day | ORAL | Status: DC | PRN

## 2018-07-03 MED ORDER — ENOXAPARIN SODIUM 40 MG/0.4ML ~~LOC~~ SOLN
40.0000 mg | SUBCUTANEOUS | Status: DC
  Administered 2018-07-03 – 2018-07-04 (×2): 40 mg via SUBCUTANEOUS
  Filled 2018-07-03 (×2): qty 0.4

## 2018-07-03 MED ORDER — PIPERACILLIN-TAZOBACTAM 3.375 G IVPB
3.3750 g | Freq: Three times a day (TID) | INTRAVENOUS | Status: DC
  Administered 2018-07-04 – 2018-07-05 (×5): 3.375 g via INTRAVENOUS
  Filled 2018-07-03 (×6): qty 50

## 2018-07-03 MED ORDER — QUETIAPINE FUMARATE 50 MG PO TABS: 150.0000 mg | ORAL_TABLET | Freq: Every day | ORAL | Status: DC

## 2018-07-03 MED ORDER — CHLORHEXIDINE GLUCONATE CLOTH 2 % EX PADS
6.0000 | MEDICATED_PAD | Freq: Every day | CUTANEOUS | Status: DC
  Administered 2018-07-04 – 2018-07-05 (×2): 6 via TOPICAL

## 2018-07-03 MED ORDER — OXYCODONE-ACETAMINOPHEN 10-325 MG PO TABS: 1.0000 | ORAL_TABLET | Freq: Three times a day (TID) | ORAL | Status: DC | PRN

## 2018-07-03 MED ORDER — ONDANSETRON HCL 4 MG PO TABS: 4.0000 mg | ORAL_TABLET | Freq: Four times a day (QID) | ORAL | Status: DC | PRN

## 2018-07-03 MED ORDER — VANCOMYCIN HCL IN DEXTROSE 1-5 GM/200ML-% IV SOLN
1000.0000 mg | Freq: Once | INTRAVENOUS | Status: AC
  Administered 2018-07-03: 1000 mg via INTRAVENOUS
  Filled 2018-07-03: qty 200

## 2018-07-03 MED ORDER — SODIUM CHLORIDE 0.9 % IV BOLUS
1500.0000 mL | Freq: Once | INTRAVENOUS | Status: AC
  Administered 2018-07-03: 1500 mL via INTRAVENOUS

## 2018-07-03 MED ORDER — OXYCODONE HCL 5 MG PO TABS
5.0000 mg | ORAL_TABLET | Freq: Three times a day (TID) | ORAL | Status: DC | PRN
  Administered 2018-07-03 – 2018-07-04 (×4): 5 mg via ORAL
  Filled 2018-07-03 (×4): qty 1

## 2018-07-03 MED ORDER — NORTRIPTYLINE HCL 10 MG PO CAPS
10.0000 mg | ORAL_CAPSULE | Freq: Every day | ORAL | Status: DC
  Administered 2018-07-03 – 2018-07-04 (×2): 10 mg via ORAL
  Filled 2018-07-03 (×2): qty 1

## 2018-07-03 MED ORDER — INSULIN ASPART 100 UNIT/ML ~~LOC~~ SOLN
0.0000 [IU] | Freq: Every day | SUBCUTANEOUS | Status: DC
  Administered 2018-07-03 – 2018-07-04 (×2): 5 [IU] via SUBCUTANEOUS

## 2018-07-03 MED ORDER — GABAPENTIN 300 MG PO CAPS
600.0000 mg | ORAL_CAPSULE | Freq: Two times a day (BID) | ORAL | Status: DC
  Administered 2018-07-03 – 2018-07-05 (×4): 600 mg via ORAL
  Filled 2018-07-03 (×4): qty 2

## 2018-07-03 MED ORDER — SODIUM CHLORIDE 0.9 % IV BOLUS
500.0000 mL | Freq: Once | INTRAVENOUS | Status: AC
  Administered 2018-07-03: 500 mL via INTRAVENOUS

## 2018-07-03 MED ORDER — TAMSULOSIN HCL 0.4 MG PO CAPS
0.4000 mg | ORAL_CAPSULE | Freq: Every day | ORAL | Status: DC
  Administered 2018-07-04 – 2018-07-05 (×2): 0.4 mg via ORAL
  Filled 2018-07-03 (×2): qty 1

## 2018-07-03 MED ORDER — SODIUM CHLORIDE 0.9 % IV BOLUS
1000.0000 mL | Freq: Once | INTRAVENOUS | Status: AC
  Administered 2018-07-03: 1000 mL via INTRAVENOUS

## 2018-07-03 MED ORDER — MUPIROCIN 2 % EX OINT
1.0000 "application " | TOPICAL_OINTMENT | Freq: Two times a day (BID) | CUTANEOUS | Status: DC
  Administered 2018-07-04 – 2018-07-05 (×4): 1 via NASAL
  Filled 2018-07-03 (×2): qty 22

## 2018-07-03 MED ORDER — INSULIN ASPART 100 UNIT/ML ~~LOC~~ SOLN
0.0000 [IU] | Freq: Three times a day (TID) | SUBCUTANEOUS | Status: DC
  Administered 2018-07-04: 11 [IU] via SUBCUTANEOUS
  Administered 2018-07-04: 4 [IU] via SUBCUTANEOUS
  Administered 2018-07-04 – 2018-07-05 (×2): 7 [IU] via SUBCUTANEOUS

## 2018-07-03 MED ORDER — OXYCODONE-ACETAMINOPHEN 5-325 MG PO TABS
1.0000 | ORAL_TABLET | Freq: Three times a day (TID) | ORAL | Status: DC | PRN
  Administered 2018-07-03 – 2018-07-04 (×4): 1 via ORAL
  Filled 2018-07-03 (×4): qty 1

## 2018-07-03 MED ORDER — CLONAZEPAM 1 MG PO TABS
1.0000 mg | ORAL_TABLET | Freq: Three times a day (TID) | ORAL | Status: DC | PRN
  Administered 2018-07-04: 1 mg via ORAL
  Filled 2018-07-03: qty 1

## 2018-07-03 NOTE — ED Triage Notes (Signed)
Was seen on the 7/4 for same  Infections ansd placed on 2 antiobiotics , just saw her dr today and was sent for IV antibiotics, has a foley and states is allergic to lkatex and it has balloon an it is irritating her

## 2018-07-03 NOTE — Progress Notes (Signed)
[name] [id number] Admission Data: 07/03/2018 7:31 PM Attending Provider: Oval Linsey, MD  [id number], Jeneen Rinks, MD Consults/ Treatment Team:   [name] is a 52 y.o. female patient admitted from ED awake, alert  & orientated  X 3,  Full Code, VSS - Blood pressure (!) 153/68, pulse 92, temperature 98.7 F (37.1 C), temperature source Oral, resp. rate 20, SpO2 100 %.,  nasal cannular, no c/o shortness of breath, no c/o chest pain, no distress noted. Tele # [id number] placed and pt is currently running:normal sinus rhythm.   IV site WDL:  hand right, condition patent and no redness with a transparent dsg that's clean dry and intact.  Allergies:   Allergies  Allergen Reactions  . Latex Itching, Rash and Other (See Comments)    Pt states she cannot use condoms - cause an infection.  Use of latex on skin is okay.  Tape causes rash  . Sweetening Enhancer [Flavoring Agent] Nausea And Vomiting and Other (See Comments)    HEADACHES  . Aspartame And Phenylalanine Nausea And Vomiting    HEADACHES  . Ibuprofen Other (See Comments)    HEADACHES  . Trazodone And Nefazodone Other (See Comments)    Hallucinations   . Triazolam Other (See Comments)    HALLUCINATIONS     Past Medical History:  Diagnosis Date  . Anxiety   . Arthritis   . Bipolar disorder ([place])   . Broken jaw ([place])   . Broken wrist   . Chronic back pain   . Claudication ([place])    a. 12/2013 ABI's: R 0.97, L 0.94.  [name] COPD (chronic obstructive pulmonary disease) ([place])   . Depression   . Diabetic Charcot's foot ([place])   . Diabetic foot ulcer ([facility])    chronic left foot ulcer , great toe/  hx recurrent foot ulcer bilaterally  . DJD (degenerative joint disease)   . Edema of both lower extremities   . Episode of memory loss   . GERD (gastroesophageal reflux disease)   . Headache(784.0)   . Hiatal hernia   . History of colon cancer, stage I dx 02-26-2015--- oncologist-- dr [name]--- per last note no recurrence   05-16-2015  s/p  Laparoscopic sigmoid colectomy w/ node bx's (negative nodes per path)  Stage I (pT1,N0,M0) Grade 2  . 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   per MRI multiple small strokes post cardiac cath. --  residual mild memory loss  . History of methicillin resistant staphylococcus aureus (MRSA)   . Hypercholesterolemia   . Insomnia   . 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 Neurogenic bladder   . Neuropathy, diabetic ([place])    hands and feet  . OSA (obstructive sleep apnea)    cpap intolerant  . Personality disorder ([place])   . Restless leg syndrome   . SOB (shortness of breath) on exertion   . Stroke ([place])   . SUI (stress urinary incontinence, female) S/P [facility] 12-29-2011      Pt orientation to unit, room and routine. Information packet given to patient/family and safety video watched.  Admission INP armband ID verified with patient/family, and in place. SR up x 2, fall risk assessment complete with Patient and family verbalizing understanding of risks associated with falls. Pt verbalizes  an understanding of how to use the call bell and to call for help before getting out of bed.       Will cont to monitor and assist as needed.  [name] N [place] Capers, RN 07/03/2018 7:31 PM

## 2018-07-03 NOTE — Progress Notes (Signed)
CRITICAL VALUE ALERT  Critical Value:  Lactic acid 2.2  Date & Time Notied:  07/03/18 at 1947  Provider Notified: Internal med intern on call notified.  Orders Received/Actions taken: No new orders at this time

## 2018-07-03 NOTE — Progress Notes (Signed)
CRITICAL VALUE ALERT  Critical Value:  Lactic acid 2.2  Date & Time Notied:  07/03/18 at 2242  Provider Notified: Internal medicine intern on call  Orders Received/Actions taken: NS bolus 568ml ordered, new order for lactic acid placed

## 2018-07-03 NOTE — ED Provider Notes (Signed)
[name] EMERGENCY DEPARTMENT Provider Note   CSN: [id number] Arrival date & time: 07/03/18  1131     History   Chief Complaint Chief Complaint  Patient presents with  . Recurrent Skin Infections    HPI [name] is a 52 y.o. female.  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  She does not know why these areas of infection started, I reviewed the prior medical record including the pictures that were uploaded in view of these infectious sites.  She was started on Keflex and doxycycline which she has been taking for the last couple of days.  She followed up with her orthopedist, Dr. Sharol Given, in the office today and was found to have worsening swelling and redness for which she referred her to the emergency department for IV antibiotics and admission to the hospital.  The patient denies fevers but overall does not feel well.  She has had a urinary catheter for the last 6 days and states that it is making her feel very poorly and giving her discomfort in her abdomen.  Her symptoms are persistent, gradually worsening and become moderate to severe.  She denies vomiting or diarrhea.  She states "I cannot drink diet drinks I am allergic to them, I need regular cola" if I am admitted  Past Medical History:  Diagnosis Date  . Anxiety   . Arthritis   . Bipolar disorder ([place])   . Broken jaw ([place])   . Broken wrist   . Chronic back pain   . Claudication ([place])    a. 12/2013 ABI's: R 0.97, L 0.94.  [name] COPD (chronic obstructive pulmonary disease) ([place])   . Depression   . Diabetic Charcot's foot ([place])   . Diabetic foot  ulcer (HCC)    chronic left foot ulcer , great toe/  hx recurrent foot ulcer bilaterally  . DJD (degenerative joint disease)   . Edema of both lower extremities   . Episode of memory loss   . GERD (gastroesophageal reflux disease)   . Headache(784.0)   . Hiatal hernia   . History of colon cancer, stage I dx 02-26-2015--- oncologist-- dr [name]--- per last note no recurrence   05-16-2015 s/p  Laparoscopic sigmoid colectomy w/ node bx's (negative nodes per path)  Stage I (pT1,N0,M0) Grade 2  . 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   per MRI multiple small strokes post cardiac cath. --  residual mild memory loss  . History of methicillin resistant staphylococcus aureus (MRSA)   . Hypercholesterolemia   . Insomnia   . Insulin dependent type 2 diabetes mellitus, uncontrolled ([place]) dx 2004   endocrinologist-  dr Dwyane Dee-- last A1c 8.8 in Aug2018:  pt is noncompliant w/ diet, states does note eat breakfast , her first main meal in afternoon  . Neurogenic bladder   . Neuropathy, diabetic ([place])    hands and feet  . OSA (obstructive sleep apnea)    cpap intolerant  . Personality disorder ([place])   . Restless leg syndrome   . SOB (shortness of breath) on exertion   .  Stroke ([place])   . SUI (stress urinary incontinence, female) S/P SLING 12-29-2011    Patient Active Problem List   Diagnosis Date Noted  . UTI (urinary tract infection) 05/23/2018  . Acute metabolic encephalopathy 81/19/1478  . Toxic encephalopathy 05/09/2018  . GERD (gastroesophageal reflux disease) 05/09/2018  . Tobacco abuse 05/09/2018  . Polypharmacy 05/09/2018  . Restless leg syndrome 04/27/2018  . Anxiety 04/27/2018  . Hyponatremia 04/26/2018  . Noncompliance with dietary restriction 04/26/2018  . Postoperative cellulitis of surgical wound 04/26/2018  . At high risk for falls 04/24/2018  . Noncompliance with safety precautions 04/24/2018  . Leukocytosis 04/24/2018  . 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 PAD (peripheral artery disease) ([place])   . Poorly controlled type 2 diabetes mellitus ([place])   . Subacute osteomyelitis, right ankle and foot ([place])   . Major depressive disorder, recurrent episode ([place]) 04/11/2018  . Diabetic ulcer of right midfoot associated with type 2 diabetes mellitus, with necrosis of bone ([place])   . Abscess of ankle   . Infectious synovitis   . Diabetic foot infection ([place])   . Sepsis ([place]) 04/09/2018  . Cancer of sigmoid colon ([place]) 03/09/2015  . OSA (obstructive sleep apnea) 07/04/2014  . Migraine without aura, with intractable migraine, so stated, without mention of status migrainosus 03/26/2014  . Peripheral neuropathy 03/03/2014  . Abnormal stress test 02/12/2014  . CVA (cerebral infarction) 02/12/2014  . Chest pain   . Abnormal heart rhythm   . Edema   . Hypertension   . Hyperlipemia   . Hypercholesterolemia   . Other and unspecified hyperlipidemia 11/14/2013  . Type II diabetes mellitus, uncontrolled ([place]) 07/22/2013  . Essential hypertension 07/22/2013  . Diabetic neuropathy with neurologic complication ([place]) 29/56/2130  . Diabetic polyneuropathy ([place]) 07/22/2013    Past Surgical History:  Procedure Laterality Date  . AMPUTATION Right 04/13/2018   Procedure: RIGHT BELOW KNEE AMPUTATION;  Surgeon: Newt Minion, MD;  Location: [facility];  Service: Orthopedics;  Laterality: Right;  . ANTERIOR CERVICAL DECOMP/DISCECTOMY FUSION  2000   C5 - 7  . BACK SURGERY    . CARDIAC CATHETERIZATION  05-22-2008   DR SKAINS   NO SIGNIFECANT CAD/ NORMAL LV/  EF 65%/  NO WALL MOTION ABNORMALITIES  . CARPAL TUNNEL RELEASE Right 04-25-2013  . [place]; 1992  . COLON SURGERY    . COLONOSCOPY    . CYSTO N/A 04/30/2013   Procedure: CYSTOSCOPY;  Surgeon: Reece Packer, MD;  Location: [facility];  Service: Urology;  Laterality: N/A;  . CYSTOSCOPY MACROPLASTIQUE IMPLANT N/A 02/06/2018   Procedure: CYSTOSCOPY MACROPLASTIQUE IMPLANT;  Surgeon:  Bjorn Loser, MD;  Location: [facility];  Service: Urology;  Laterality: N/A;  . CYSTOSCOPY WITH INJECTION  05/04/2012   Procedure: CYSTOSCOPY WITH INJECTION;  Surgeon: Reece Packer, MD;  Location: [facility];  Service: Urology;  Laterality: N/A;  MACROPLASTIQUE INJECTION  . CYSTOSCOPY WITH INJECTION  08/28/2012   Procedure: CYSTOSCOPY WITH INJECTION;  Surgeon: Reece Packer, MD;  Location[facility];  Service: Urology;  Laterality: N/A;  cysto and macroplastique   . FOOT SURGERY Bilateral    "related to Chacots"  . HERNIA REPAIR  ?1996   "stomach"  . INCISION AND DRAINAGE OF WOUND Right 04/11/2018   Procedure: IRRIGATION AND DEBRIDEMENT WOUND right foot and right ankle;  Surgeon: Evelina Bucy, DPM;  Location: [facility];  Service: Podiatry;  Laterality: Right;  . KNEE ARTHROSCOPY Left   .  KNEE ARTHROSCOPY W/ ALLOGRAFT IMPANT Left    graft x 2  . KNEE SURGERY     TOTAL 8 SURG'S  . LAPAROSCOPIC SIGMOID COLECTOMY N/A 04/22/2015   Procedure: LAPAROSCOPIC HAND ASSISTED SIGMOID COLECTOMY;  Surgeon: Erroll Luna, MD;  Location: [facility];  Service: General;  Laterality: N/A;  . LEFT HEART CATHETERIZATION WITH CORONARY ANGIOGRAM N/A 02/12/2014   Procedure: LEFT HEART CATHETERIZATION WITH CORONARY ANGIOGRAM;  Surgeon: Candee Furbish, MD;  Location[facility];  Service: Cardiovascular;  Laterality: N/A;   No angiographically significant CAD; normal LVSF, LVEDP 42mmHg,  EF 55% (new finding ef 30% myoview 01-08-2014)  . LUMBAR FUSION    . MANDIBLE FRACTURE SURGERY    . MULTIPLE LAPAROSCOPIES FOR ENDOMETRIOSIS    . PUBOVAGINAL SLING  12/29/2011   Procedure: Gaynelle Arabian;  Surgeon: Reece Packer, MD;  Location[facility];  Service: Urology;  Laterality: N/A;  cysto and sparc sling   . PUBOVAGINAL SLING N/A 04/30/2013   Procedure: REMOVAL OF VAGINAL MESH;  Surgeon: Reece Packer, MD;  [facility];  Service: Urology;   Laterality: N/A;  . RECONSTURCTION OF CONGENITAL UTERUS ANOMALY  1983  . REPEAT RECONSTRUCTION ACL LEFT KNEE/ SCREWS REMOVED  03-28-2000   CADAVER GRAFT  . TOTAL ABDOMINAL HYSTERECTOMY  1997   w/BSO  . TRANSTHORACIC ECHOCARDIOGRAM  02/13/2014   ef 45%, hypokinesis base inferior and base inferolateral walls  . UPPER GI ENDOSCOPY    . WOUND DEBRIDEMENT Right 09/05/2016   Procedure: DEBRIDEMENT WOUND WITH GRAFT RIGHT FOOT;  Surgeon: Landis Martins, DPM;  Location: [facility];  Service: Podiatry;  Laterality: Right;  . WRIST FRACTURE SURGERY       OB History   None      Home Medications    Prior to Admission medications   Medication Sig Start Date End Date Taking? Authorizing Provider  aspirin-acetaminophen-caffeine (EXCEDRIN MIGRAINE) [phone] MG tablet Take 2 tablets by mouth every 6 (six) hours as needed for headache.   Yes [provider]  cephALEXin (KEFLEX) 500 MG capsule Take 1 capsule (500 mg total) by mouth 4 (four) times daily. 06/28/18  Yes Glyn Ade, PA-C  clonazePAM (KLONOPIN) 1 MG tablet Take 1 mg by mouth 3 (three) times daily as needed for anxiety or agitation. 06/22/18  Yes [provider]  cyclobenzaprine (FLEXERIL) 10 MG tablet Take 10 mg by mouth 3 (three) times daily as needed for muscle spasms.  05/18/18  Yes [provider]  doxycycline (VIBRAMYCIN) 100 MG capsule Take 1 capsule (100 mg total) by mouth 2 (two) times daily. 06/28/18  Yes Glyn Ade, PA-C  empagliflozin (JARDIANCE) 25 MG TABS tablet Take 25 mg by mouth daily.   Yes [provider]  gabapentin (NEURONTIN) 600 MG tablet Take 1 tablet (600 mg total) by mouth 2 (two) times daily. Take in the morning and afternoon. Do not take at night time. 05/10/18  Yes Mariel Aloe, MD  HETLIOZ 20 MG CAPS Take 20 mg by mouth at bedtime. 05/02/18  Yes [provider]  INGREZZA 80 MG CAPS Take 80 mg by mouth at bedtime. 05/01/18  Yes [provider]  insulin  aspart (NOVOLOG) 100 UNIT/ML injection Inject 10-35 Units into the skin 3 (three) times daily with meals. Take 10 units before your main meal. If blood sugars are consistently over 250 after the meal start taking 14 units before your main meal. 35 UNITS AT SUPPER   Yes [provider]  lidocaine (LIDODERM) 5 % Place 1 patch onto the skin daily as needed (pain). Remove & Discard patch within 12 hours or as directed by MD 02/27/18  Yes Landis Martins, DPM  nortriptyline (PAMELOR) 10 MG capsule Take 10 mg by mouth at bedtime. 05/17/18  Yes [provider]  ondansetron (ZOFRAN) 4 MG tablet Take 1 tablet (4 mg total) by mouth every 6 (six) hours as needed for nausea. 04/16/18  Yes Elgergawy, Silver Huguenin, MD  Oxycodone HCl 10 MG TABS Take 10 mg by mouth every 6 (six) hours as needed for severe pain.   Yes [provider]  oxyCODONE-acetaminophen (PERCOCET) 10-325 MG tablet Take 1 tablet by mouth every 8 (eight) hours as needed for pain. 05/03/18  Yes Newt Minion, MD  pantoprazole (PROTONIX) 40 MG tablet Take 1 tablet (40 mg total) by mouth daily. 04/27/18  Yes Love, Ivan Anchors, PA-C  QUEtiapine (SEROQUEL) 300 MG tablet Take 600 mg by mouth at bedtime. 06/12/18  Yes [provider]  rOPINIRole (REQUIP) 2 MG tablet Take 4 mg by mouth 3 (three) times daily.  05/30/18  Yes [provider]  silver sulfADIAZINE (SILVADENE) 1 % cream Apply 1 application topically as needed. 06/21/18  Yes [provider]  tamsulosin (FLOMAX) 0.4 MG CAPS capsule Take 0.4 mg by mouth daily. 05/17/18  Yes [provider]  VRAYLAR 4.5 MG CAPS Take 4.5 mg by mouth at bedtime. 05/31/18  Yes [provider]  bisacodyl (DULCOLAX) 5 MG EC tablet Take 1 tablet (5 mg total) by mouth daily as needed for moderate constipation. Patient not taking: Reported on 07/03/2018 04/16/18   Elgergawy, Silver Huguenin, MD  mineral oil-hydrophilic petrolatum (AQUAPHOR) ointment Apply topically as needed for dry  skin. Patient not taking: Reported on 07/03/2018 05/29/18   Landis Martins, DPM  QUEtiapine (SEROQUEL) 50 MG tablet Take 3 tablets (150 mg total) by mouth at bedtime. Patient not taking: Reported on 07/03/2018 05/25/18 07/03/18  Damita Lack, MD    Family History Family History  Problem Relation Age of Onset  . Hypertension Mother   . Diabetes Mother   . Cancer - Other Mother        lymphoma   . Cancer - Other Father        lung, bladder cancer   . Heart attack Father   . Cancer - Other Brother        bladder cancer     Social History Social History   Tobacco Use  . Smoking status: Current Every Day Smoker    Packs/day: 1.50    Years: 48.00    Pack years: 72.00    Types: Cigarettes  . Smokeless tobacco: Never Used  . Tobacco comment: per pt started smoking  age 89  Substance Use Topics  . Alcohol use: No    Comment: 05/24/2018 "special occasions only"  . Drug use: Not Currently     Allergies   Latex; Sweetening enhancer [flavoring agent]; Aspartame and phenylalanine; Ibuprofen; Trazodone and nefazodone; and Triazolam   Review of Systems Review of Systems  All other systems reviewed and are negative.    Physical Exam Updated Vital Signs BP (!) 144/63   Pulse 93   Temp 97.7 F (36.5 C) (Oral)   Resp 18   SpO2 100%   Physical Exam  Constitutional: She appears well-developed and well-nourished. No distress.  HENT:  Head: Normocephalic and atraumatic.  Mouth/Throat: No oropharyngeal exudate.  MM dry  Eyes: Pupils are equal, round, and  reactive to light. Conjunctivae and EOM are normal. Right eye exhibits no discharge. Left eye exhibits no discharge. No scleral icterus.  Neck: Normal range of motion. Neck supple. No JVD present. No thyromegaly present.  Cardiovascular: Normal rate, regular rhythm, normal heart sounds and intact distal pulses. Exam reveals no gallop and no friction rub.  No murmur heard. Pulmonary/Chest: Effort normal and breath sounds normal.  No respiratory distress. She has no wheezes. She has no rales.  Abdominal: Soft. Bowel sounds are normal. She exhibits no distension and no mass. There is no tenderness.  Genitourinary:  Genitourinary Comments: Foley in place  Musculoskeletal: Normal range of motion. She exhibits tenderness ( ttp ove rthe R stump (minimal), ttp over the rash on wrist and elbow). She exhibits no edema.  Lymphadenopathy:    She has no cervical adenopathy.  Neurological: She is alert. She has normal strength. Coordination normal. GCS eye subscore is 4. GCS verbal subscore is 5. GCS motor subscore is 6.  Normal strength in all 4 extremities - normal coordination, speech and memory and no facial droop.  Skin: Skin is warm and dry. No rash noted. No erythema.  Psychiatric: She has a normal mood and affect. Her behavior is normal.  Nursing note and vitals reviewed.    ED Treatments / Results  Labs (all labs ordered are listed, but only abnormal results are displayed) Labs Reviewed  CBC WITH DIFFERENTIAL/PLATELET - Abnormal; Notable for the following components:      Result Value   WBC 18.4 (*)    Hemoglobin 15.2 (*)    HCT 46.1 (*)    Neutro Abs 12.8 (*)    Lymphs Abs 4.5 (*)    All other components within normal limits  COMPREHENSIVE METABOLIC PANEL - Abnormal; Notable for the following components:   Potassium 3.3 (*)    Glucose, Bld 157 (*)    Albumin 3.3 (*)    All other components within normal limits  URINALYSIS, ROUTINE W REFLEX MICROSCOPIC - Abnormal; Notable for the following components:   Color, Urine STRAW (*)    Glucose, UA >=500 (*)    Hgb urine dipstick LARGE (*)    All other components within normal limits  I-STAT CG4 LACTIC ACID, ED - Abnormal; Notable for the following components:   Lactic Acid, Venous 2.08 (*)    All other components within normal limits  I-STAT CG4 LACTIC ACID, ED - Abnormal; Notable for the following components:   Lactic Acid, Venous 4.05 (*)    All other  components within normal limits  CULTURE, BLOOD (ROUTINE X 2)  CULTURE, BLOOD (ROUTINE X 2)  URINE CULTURE    EKG EKG Interpretation  Date/Time:  Tuesday July 03 2018 15:40:57 EDT Ventricular Rate:  90 PR Interval:    QRS Duration: 96 QT Interval:  421 QTC Calculation: 516 R Axis:   81 Text Interpretation:  Sinus rhythm Prolonged QT interval since last tracing no significant change Confirmed by Noemi Chapel [phone]) on 07/03/2018 4:00:53 PM   Radiology Dg Elbow Complete Left  Result Date: 07/03/2018 CLINICAL DATA:  Infected appearing wounds over the posterior left elbow. Recent amputation of the right leg due to gangrene. EXAM: LEFT ELBOW - COMPLETE 3+ VIEW COMPARISON:  Left elbow series dated June 28, 2018. FINDINGS: The bones are subjectively adequately mineralized. There is no acute or healing fracture. There is no lytic nor blastic lesion. There are no abnormal soft tissue calcifications or gas collections. IMPRESSION: There is no evidence of osteomyelitis nor  other acute bony abnormality of the left elbow. Mild soft tissue swelling over the olecranon without definite posterior fat pad sign. Electronically Signed   By: David  Martinique M.D.   On: 07/03/2018 13:08   Dg Wrist Complete Left  Result Date: 07/03/2018 CLINICAL DATA:  Infected appearing wounds over the distal left ulna near the styloid process for the past 10 days. Patient underwent recent amputation of the lower right leg due to gangrene. History of diabetes, current smoker. EXAM: LEFT WRIST - COMPLETE 3+ VIEW COMPARISON:  Left wrist series of June 28, 2018 FINDINGS: The bones are subjectively adequately mineralized. There is no acute or healing fracture. There is no lytic or blastic lesion. No significant arthropathic changes are observed. Specific attention to the ulnar styloid reveals no abnormality. There is mild diffuse soft tissue swelling. IMPRESSION: There is no acute or significant chronic bony abnormality of the left wrist.  There is mild soft tissue swelling. Electronically Signed   By: David  Martinique M.D.   On: 07/03/2018 13:07   Dg Chest Port 1 View  Result Date: 07/03/2018 CLINICAL DATA:  Fever, sepsis EXAM: PORTABLE CHEST 1 VIEW COMPARISON:  Chest x-ray of 05/22/2017 FINDINGS: No active infiltrate or effusion is seen. Mediastinal and hilar contours are unremarkable. The heart is within normal limits in size. No bony abnormality is seen. IMPRESSION: No active disease. Electronically Signed   By: Ivar Drape M.D.   On: 07/03/2018 15:51    Procedures .Critical Care Performed by: Noemi Chapel, MD Authorized by: Noemi Chapel, MD   Critical care provider statement:    Critical care time (minutes):  35   Critical care time was exclusive of:  Separately billable procedures and treating other patients and teaching time   Critical care was necessary to treat or prevent imminent or life-threatening deterioration of the following conditions:  Sepsis   Critical care was time spent personally by me on the following activities:  Blood draw for specimens, development of treatment plan with patient or surrogate, discussions with consultants, evaluation of patient's response to treatment, examination of patient, obtaining history from patient or surrogate, ordering and performing treatments and interventions, ordering and review of laboratory studies, ordering and review of radiographic studies, pulse oximetry, re-evaluation of patient's condition and review of old charts   (including critical care time)  Medications Ordered in ED Medications  vancomycin (VANCOCIN) IVPB 1000 mg/200 mL premix (1,000 mg Intravenous New Bag/Given 07/03/18 1613)  sodium chloride 0.9 % bolus 1,500 mL (1,500 mLs Intravenous New Bag/Given 07/03/18 1613)  sodium chloride 0.9 % bolus 1,000 mL (0 mLs Intravenous Stopped 07/03/18 1646)  piperacillin-tazobactam (ZOSYN) IVPB 3.375 g (0 g Intravenous Stopped 07/03/18 1613)     Initial Impression / Assessment  and Plan / ED Course  I have reviewed the triage vital signs and the nursing notes.  Pertinent labs & imaging results that were available during my care of the patient were reviewed by me and considered in my medical decision making (see chart for details).     The patient is definitely concerning in her appearance as she has worsening lactic acidosis and has had a progressive lactic acid from 2 to above for over the last several hours.  Her white blood cell count is over 18,000 and she has 2 wounds on her elbow and her distal ulnar side of the wrist which appear to be infected, the x-rays thankfully do not show osteomyelitis or subcutaneous emphysema or any signs of necrotizing fasciitis, the compartments are  soft and nontender otherwise.  She also has a urinary catheter which likely does not need to be there anymore.  She did not have any difficulty urinating prior to her visit on the fourth.  She did have some urinary retention at that time which required the Foley catheter evidently.  We will remove the catheter, culture the urine, blood cultures, admit for antibiotics, broad-spectrum given with both vancomycin and Zosyn, IV fluids have also been given as well.  Cellulitis with associated sepsis.  No murmur to suggest endocarditis - no other source of infection shy of possible UTI given her indwelling catheter.  D/w IM resident who will admit Culture obtained before antibiotics 30cc / kg of IVF given due to lactic > 4  Final Clinical Impressions(s) / ED Diagnoses   Final diagnoses:  Sepsis, due to unspecified organism [facility])    ED Discharge Orders    None       Noemi Chapel, MD 07/03/18 1646

## 2018-07-03 NOTE — Progress Notes (Signed)
Pharmacy Antibiotic Note  [name] is a 52 y.o. female admitted on 07/03/2018 with sepsis.  Pharmacy has been consulted for vancomycin and zosyn dosing. Initial doses given in the ED  Plan: Continue zosyn 3.375 gm IV q8 hours Continue vancomycin 1gm IV q12 hours F/u renal function,cultures and clinical course     Temp (24hrs), Avg:98.2 F (36.8 C), Min:97.7 F (36.5 C), Max:98.7 F (37.1 C)  Recent Labs  Lab 06/28/18 1450 06/28/18 1614 07/03/18 1223 07/03/18 1259 07/03/18 1443 07/03/18 1835  WBC 15.4*  --  18.4*  --   --   --   CREATININE 0.69  --  0.56  --   --   --   LATICACIDVEN  --  1.29  --  2.08* 4.05* 2.2*    Estimated Creatinine Clearance: 85.6 mL/min (by C-G formula based on SCr of 0.56 mg/dL).    Allergies  Allergen Reactions  . Latex Itching, Rash and Other (See Comments)    Pt states she cannot use condoms - cause an infection.  Use of latex on skin is okay.  Tape causes rash  . Sweetening Enhancer [Flavoring Agent] Nausea And Vomiting and Other (See Comments)    HEADACHES  . Aspartame And Phenylalanine Nausea And Vomiting    HEADACHES  . Ibuprofen Other (See Comments)    HEADACHES  . Trazodone And Nefazodone Other (See Comments)    Hallucinations   . Triazolam Other (See Comments)    HALLUCINATIONS    Thank you for allowing pharmacy to be a part of this patient's care.  Excell Seltzer [place] 07/03/2018 10:34 PM

## 2018-07-03 NOTE — H&P (Signed)
Date: 07/03/2018               Patient Name:  [name] MRN: [id number]  DOB: May 12, 1966 Age / Sex: 52 y.o., female   PCP: Tamsen Roers, MD              Medical Service: Internal Medicine Teaching Service              Attending Physician: Dr. Oval Linsey, MD    First Contact: Tommye Standard, MS4 Pager: [phone]  Second Contact: Dr. Lorella Nimrod  Pager: [phone]  Third Contact Dr. 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 Sharol Given, with concern for skin infection. Pt had wounds of her L elbow and L wrist and well as wound of her R BKA. Pt reports she fell 2 weeks ago at a parking lot and cut her L elbow. Pt states wrist wound was not present at that time, and was first seen 1 week ago. Pt reports pain of wounds at 7/10 on pain scale and surrounding redness which has been present for 5 days. Pt endorses associated shooting pains of her R hypothenar eminence which travel down her 4th and 5th digit. She endorses numbness of the 5th digit and medial aspect of her 4th digit.   Pt was seen in the ED 7/4 for bowel impaction which resolved spontaneously, difficulty with urination, for which Foley was placed for 5 days, and L arm infections. Pt was sent home with Keflex and doxycycline for skin infections, which she reports taking every day.   Pt denies n/v, diarrhea. She endorses lower abdominal pain which she attributes to the Foley that is currently in place from recent ED visit as above. Denis fever, endorses chills. Pt has pets, but denies bites,  scratches or bug bites. She has not been spending time outdoors. Per chart review pt has previously reported that she has trauma on her knees and elbows because she crawls around her house to get around since her BKA.  Pt reports she checks her BG 2 times / day at home and it usually runs in the 300s. She reports being prescribed a long acting insulin that she doesn't take. She reports her Novolog use as when she remembers to check her BG, if its high, she gives herself 20 units. Denies taking Novolog with meals.  Pt reports she sees Dr. Rivka Safer at [facility] and [place]. Today she denies SI, self harm, auditory or visual hallucinations.  In the ED blood and urine cultures were obtained. She received 2.5L fluid and started on Zosyn and Vancomycin.  Meds:  Current Meds  Medication Sig  . aspirin-acetaminophen-caffeine (EXCEDRIN MIGRAINE) 250-250-65 MG tablet Take 2 tablets by mouth every 6 (six) hours as needed for headache.  . cephALEXin (KEFLEX) 500 MG capsule Take 1 capsule (500 mg total) by mouth 4 (four) times daily.  . clonazePAM (KLONOPIN) 1 MG tablet Take 1 mg by mouth 3 (three) times daily as needed for anxiety or agitation.  . cyclobenzaprine (FLEXERIL) 10 MG tablet Take 10 mg by mouth 3 (  three) times daily as needed for muscle spasms.   [name] doxycycline (VIBRAMYCIN) 100 MG capsule Take 1 capsule (100 mg total) by mouth 2 (two) times daily.  . empagliflozin (JARDIANCE) 25 MG TABS tablet Take 25 mg by mouth daily.  [name] gabapentin (NEURONTIN) 600 MG tablet Take 1 tablet (600 mg total) by mouth 2 (two) times daily. Take in the morning and afternoon. Do not take at night time.  [name] HETLIOZ 20 MG CAPS Take 20 mg by mouth at bedtime.  . INGREZZA 80 MG CAPS Take 80 mg by mouth at bedtime.  . insulin aspart (NOVOLOG) 100 UNIT/ML injection Inject 10-35 Units into the skin 3 (three) times daily with meals. Take 10 units before your main meal. If blood sugars are consistently over 250  after the meal start taking 14 units before your main meal. 35 UNITS AT SUPPER  . lidocaine (LIDODERM) 5 % Place 1 patch onto the skin daily as needed (pain). Remove & Discard patch within 12 hours or as directed by MD  . nortriptyline (PAMELOR) 10 MG capsule Take 10 mg by mouth at bedtime.  . ondansetron (ZOFRAN) 4 MG tablet Take 1 tablet (4 mg total) by mouth every 6 (six) hours as needed for nausea.  . Oxycodone HCl 10 MG TABS Take 10 mg by mouth every 6 (six) hours as needed for severe pain.  [name] oxyCODONE-acetaminophen (PERCOCET) 10-325 MG tablet Take 1 tablet by mouth every 8 (eight) hours as needed for pain.  . pantoprazole (PROTONIX) 40 MG tablet Take 1 tablet (40 mg total) by mouth daily.  . QUEtiapine (SEROQUEL) 300 MG tablet Take 600 mg by mouth at bedtime.  [name] rOPINIRole (REQUIP) 2 MG tablet Take 4 mg by mouth 3 (three) times daily.   . silver sulfADIAZINE (SILVADENE) 1 % cream Apply 1 application topically as needed.  . tamsulosin (FLOMAX) 0.4 MG CAPS capsule Take 0.4 mg by mouth daily.  [name] VRAYLAR 4.5 MG CAPS Take 4.5 mg by mouth at bedtime.     Allergies: Allergies as of 07/03/2018 - Review Complete 07/03/2018  Allergen Reaction Noted  . Latex Itching, Rash, and Other (See Comments) 05/04/2012  . Sweetening enhancer [flavoring agent] Nausea And Vomiting and Other (See Comments) 12/22/2011  . Aspartame and phenylalanine Nausea And Vomiting 01/14/2014  . Ibuprofen Other (See Comments) 12/22/2011  . Trazodone and nefazodone Other (See Comments) 04/26/2013  . Triazolam Other (See Comments) 12/22/2011   Past Medical History:  Diagnosis Date  . Anxiety   . Arthritis   . Bipolar disorder ([place])   . Broken jaw ([place])   . Broken wrist   . Chronic back pain   . Claudication ([place])    a. 12/2013 ABI's: R 0.97, L 0.94.  [name] COPD (chronic obstructive pulmonary disease) ([place])   . Depression   . Diabetic Charcot's foot ([place])   . Diabetic foot ulcer (HCC)    chronic left foot ulcer ,  great toe/  hx recurrent foot ulcer bilaterally  . DJD (degenerative joint disease)   . Edema of both lower extremities   . Episode of memory loss   . GERD (gastroesophageal reflux disease)   . Headache(784.0)   . Hiatal hernia   . History of colon cancer, stage I dx 02-26-2015--- oncologist-- dr [name]--- per last note no recurrence   05-16-2015 s/p  Laparoscopic sigmoid colectomy w/ node bx's (negative nodes per path)  Stage I (pT1,N0,M0) Grade 2  . History of CVA with residual deficit 02-12-2014  post  op cardiac cath.   per MRI multiple small strokes post cardiac cath. --  residual mild memory loss  . History of methicillin resistant staphylococcus aureus (MRSA)   . Hypercholesterolemia   . Insomnia   . Insulin dependent type 2 diabetes mellitus, uncontrolled ([place]) dx 2004   endocrinologist-  dr Dwyane Dee-- last A1c 8.8 in Aug2018:  pt is noncompliant w/ diet, states does note eat breakfast , her first main meal in afternoon  . Neurogenic bladder   . Neuropathy, diabetic ([place])    hands and feet  . OSA (obstructive sleep apnea)    cpap intolerant  . Personality disorder ([place])   . Restless leg syndrome   . SOB (shortness of breath) on exertion   . Stroke ([place])   . SUI (stress urinary incontinence, female) S/P SLING 12-29-2011    Family History: Denies all family hx  Social History: Lives alone, home PT/OT nursing come to her house. Started smoking 1.5 ppd at age 20 which calculates to 72 pack years. Denies alcohol or other drug use.  Review of Systems: A complete ROS was negative except as per HPI.   Physical Exam: Blood pressure (!) 159/71, pulse 92, temperature 97.7 F (36.5 C), temperature source Oral, resp. rate (!) 21, SpO2 100 %. Gen: NAD, sitting in bed eating HEENT: EOMI, MMM Cardio: tachycardic, regular rhythm S1, S2, no murmur Resp: LCAB, no increased WOB Abd: non-distended, non-tender, +BS Derm: L elbow with 3.5 x 1.5cm ulcerated wound with granulomatous tissue  surrounding dry brown central crusting, no crepitus, no pus visualized, not weeping at present, however looked like it had been recently weeping, surrounding erythema marked with surgical marker  L wrist with longitudinal 3.5 x 1.5cm ulcerated wound with dark brown dry central crusting with surrounding granulation tissue, no crepitus, not weeping, bright red surrounding erythema  R BKA with 3 dry ulcerations following the scar line. Without pus. Medial lesion +weeping, surrounding erythema marked with surgical marker Neuro: loss of sensation in ulnar distribution - medial 4th and complete 5th digit. Flaccid 5th digit, unable to completely extend, 3/5 interosseous strength, L 4/5 interosseous motor function Psych: frustrated mood, congruent affect            Labs:  WBC 18.4 - 69% neutrophil, 24% lymphocytes K 3.3 Glucose 157 Lactate 4.05 UA from Foley - glucose >500 (on empagliflozin), large Hgb, no infection  Micro: blood cx x 2 obtained, urine cx pending  EKG: personally reviewed my interpretation is NSR, normal axis, normal PR, QRS interval, prolonged QTc at 490, no ST or TWI concerning for ischemic changes, no LVH  Imaging: L Elbow XR: no evidence of osteomyelitis, mild soft tissue swelling over olecranon without definite posterior fat pad sign - per radiology read L Wrist XR: no evidence of osteomyelitis. Mild soft tissue swelling. CXR: personally reviewed my interpretation is this is unremarkable. No infiltrate, no effusion.   Assessment & Plan by Problem: Ms. 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 Sharol Given, with concern for skin infection.  #Severe Sepsis 2/2 Skin Source -Lactate >4, Leukocytosis, mild tachycardia -Highly  likely source of infection is skin -  elbow, wrist, and BKA stump with reported purulent drainage - cellulitis vs gangrene, less likely gas gangrene - hx uncontrolled DM -other sources much less likely, UA negative for  infection, CXR unremarkable -plain films without evidence of ostemyelitis -s/p 2.5 L NS fluid resuscitation in the ED with no hypotension Plan: -continue Zosyn and vancomycin for broad coverage, including anerobes and pseudomonas in pt with uncontrolled diabetes -will consult ortho hand surgeon with question for possible debridement d/t ulnar nerve sxs representing  compression -trend lactate -Percocet 10/325 q6h PRN pain - continued pts home regimen  #T2DM -glucose 157 -HbA1c pending, hx uncontrolled Plan: -high dose sliding scale TID, qHS to assess needs, then switch to proactive insulin regimen   #Neurogenic Bladder -Foley placement since 7/4 for urinary retention -now causing lower abd pain Plan:  -remove Foley, low threshold for bladder scan and replacement of Foley  #MDD with hx of psychosis  -previously requiring inpatient psych hospitalizations -Psychiatrist - Dr. Lattie Haw Pollus at [facility] and [place]:  -continue Klonopin 1mg  PRN up to TID for anxiety -continue nortriptyline 10mg  qHS -continue gabapentin 600mg  BID  #DVT Ppx:  -Lovenox  #Dispo: Inpatient stay for severe sepsis secondary to skin source. Expected length of stay >48 hrs.   Signed: Christin Bach, Medical Student 07/03/2018, 5:56 PM  Pager: [phone]  Attestation for Student Documentation:  I personally was present and performed or re-performed the history, physical exam and medical decision-making activities of this service and have verified that the service and findings are accurately documented in the student's note.  Lorella Nimrod, MD 07/03/2018, 9:57 PM

## 2018-07-03 NOTE — Progress Notes (Signed)
Office Visit Note   Patient: [name]           Date of Birth: 01/06/1966           MRN: [id number] Visit Date: 07/03/2018              Requested by: Tamsen Roers, [place], [place] [address] PCP: Tamsen Roers, MD  Chief Complaint  Patient presents with  . Right Leg - Routine Post Op    04/13/18 right BKA  . Left Wrist - Pain, Open Wound  . Left Elbow - Pain, Open Wound      HPI: Patient is a 52 year old woman who presents for 3 separate issues.  Patient is concerned that she is having pus coming out of her transtibial amputation.  She is almost 3 months out from surgery.  Patient recently was at the emergency room she was placed on Keflex and doxycycline for her wrist and elbow infection.  Patient complains of purulent drainage from the elbow and wrist.  Patient states that she has difficulty holding a cigarette.  Patient states she feels like she is getting allergic reaction to the Foley catheter and she states she does not warrant a diabetic diet.  Assessment & Plan: Visit Diagnoses:  1. Gas gangrene of upper extremity ([place])     Plan: With the  patient's worsening olecranon and wrist ischemic infections she is referred to the emergency room for evaluation treatment.  Anticipate patient would need a arterial study of the left upper extremity to see if there is arterial insufficiency to the left upper extremity.  Discussed that she most likely will need a hand surgery consult for evaluation of the gangrenous ulcer at the wrist and elbow.  Again discussed the importance of smoking cessation.  Patient states she may be on her last carton of cigarettes.  Patient feels like she is having allergic reaction to the Foley catheter and this will be addressed at the hospital.  Follow-Up Instructions: Return in about 1 month (around 07/31/2018).   Ortho Exam  Patient is alert, oriented, no adenopathy, well-dressed, normal affect, normal respiratory effort. Examination  photographs obtained in the office today show worsening of the ischemic ulcerations to the left wrist distal ulna and olecranon left elbow.  Patient has cellulitis and drainage and gangrenous skin.  Patient has clawing of the ring and little finger with extension of the thumb index and long finger. Does not have ulnar function in the left hand with ulnar neuropathy.  The ulnar nerve the elbow was not tender to palpation.  Patient complains of inability to hold her cigarette.  Examination of the right transtibial amputation shows a few scabs that are about 10 mm in diameter but there is no purulence no cellulitis no drainage no signs of infection.  She will continue with the stump shrinker on the right. Imaging: No results found.    Labs: Lab Results  Component Value Date   HGBA1C 9.2 03/19/2018   HGBA1C 10.0 12/11/2017   HGBA1C 8.8 08/08/2017   ESRSEDRATE 55 (H) 02/19/2016   CRP 17.4 (H) 02/19/2016   LABURIC 5.8 02/19/2016   REPTSTATUS 06/29/2018 FINAL 06/28/2018   GRAMSTAIN  04/11/2018    RARE WBC PRESENT, PREDOMINANTLY PMN NO ORGANISMS SEEN    CULT (A) 06/28/2018    <10,000 COLONIES/mL INSIGNIFICANT GROWTH Performed at [facility] [address], [place],  [address]    [place] (A) 05/24/2018  Lab Results  Component Value Date   ALBUMIN 3.3 (L) 06/28/2018   ALBUMIN 3.8 05/22/2018   ALBUMIN 3.9 05/09/2018   LABURIC 5.8 02/19/2016    Body mass index is 29.79 kg/m.  Orders:  No orders of the defined types were placed in this encounter.  No orders of the defined types were placed in this encounter.    Procedures: No procedures performed  Clinical Data: No additional findings.  ROS:  All other systems negative, except as noted in the HPI. Review of Systems  Objective: Vital Signs: Ht 5\' 5"  (1.651 m)   Wt 179 lb (81.2 kg)   BMI 29.79 kg/m   Specialty Comments:  No specialty comments available.  PMFS History: Patient  Active Problem List   Diagnosis Date Noted  . UTI (urinary tract infection) 05/23/2018  . Acute metabolic encephalopathy 86/57/8469  . Toxic encephalopathy 05/09/2018  . GERD (gastroesophageal reflux disease) 05/09/2018  . Tobacco abuse 05/09/2018  . Polypharmacy 05/09/2018  . Restless leg syndrome 04/27/2018  . Anxiety 04/27/2018  . Hyponatremia 04/26/2018  . Noncompliance with dietary restriction 04/26/2018  . Postoperative cellulitis of surgical wound 04/26/2018  . At high risk for falls 04/24/2018  . Noncompliance with safety precautions 04/24/2018  . Leukocytosis 04/24/2018  . Unilateral complete BKA ([place]) 04/16/2018  . PAD (peripheral artery disease) ([place])   . Poorly controlled type 2 diabetes mellitus ([place])   . Subacute osteomyelitis, right ankle and foot ([place])   . Major depressive disorder, recurrent episode ([place]) 04/11/2018  . Diabetic ulcer of right midfoot associated with type 2 diabetes mellitus, with necrosis of bone ([place])   . Abscess of ankle   . Infectious synovitis   . Diabetic foot infection ([place])   . Sepsis ([place]) 04/09/2018  . Cancer of sigmoid colon ([place]) 03/09/2015  . OSA (obstructive sleep apnea) 07/04/2014  . Migraine without aura, with intractable migraine, so stated, without mention of status migrainosus 03/26/2014  . Peripheral neuropathy 03/03/2014  . Abnormal stress test 02/12/2014  . CVA (cerebral infarction) 02/12/2014  . Chest pain   . Abnormal heart rhythm   . Edema   . Hypertension   . Hyperlipemia   . Hypercholesterolemia   . Other and unspecified hyperlipidemia 11/14/2013  . Type II diabetes mellitus, uncontrolled ([place]) 07/22/2013  . Essential hypertension 07/22/2013  . Diabetic neuropathy with neurologic complication ([place]) 62/95/2841  . Diabetic polyneuropathy ([place]) 07/22/2013   Past Medical History:  Diagnosis Date  . Anxiety   . Arthritis   . Bipolar disorder ([place])   . Broken jaw ([place])   . Broken wrist   . Chronic back pain   .  Claudication ([place])    a. 12/2013 ABI's: R 0.97, L 0.94.  [name] COPD (chronic obstructive pulmonary disease) ([place])   . Depression   . Diabetic Charcot's foot ([place])   . Diabetic foot ulcer ([facility])    chronic left foot ulcer , great toe/  hx recurrent foot ulcer bilaterally  . DJD (degenerative joint disease)   . Edema of both lower extremities   . Episode of memory loss   . GERD (gastroesophageal reflux disease)   . Headache(784.0)   . Hiatal hernia   . History of colon cancer, stage I dx 02-26-2015--- oncologist-- dr [name]--- per last note no recurrence   05-16-2015 s/p  Laparoscopic sigmoid colectomy w/ node bx's (negative nodes per path)  Stage I (pT1,N0,M0) Grade 2  . 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  per MRI multiple small strokes post cardiac cath. --  residual mild memory loss  . History of methicillin resistant staphylococcus aureus (MRSA)   . Hypercholesterolemia   . Insomnia   . Insulin dependent type 2 diabetes mellitus, uncontrolled ([place]) dx 2004   endocrinologist-  dr Dwyane Dee-- last A1c 8.8 in Aug2018:  pt is noncompliant w/ diet, states does note eat breakfast , her first main meal in afternoon  . Neurogenic bladder   . Neuropathy, diabetic ([place])    hands and feet  . OSA (obstructive sleep apnea)    cpap intolerant  . Personality disorder ([place])   . Restless leg syndrome   . SOB (shortness of breath) on exertion   . Stroke ([place])   . SUI (stress urinary incontinence, female) S/P SLING 12-29-2011    Family History  Problem Relation Age of Onset  . Hypertension Mother   . Diabetes Mother   . Cancer - Other Mother        lymphoma   . Cancer - Other Father        lung, bladder cancer   . Heart attack Father   . Cancer - Other Brother        bladder cancer     Past Surgical History:  Procedure Laterality Date  . AMPUTATION Right 04/13/2018   Procedure: RIGHT BELOW KNEE AMPUTATION;  Surgeon: Newt Minion, MD;  Location: [facility];   Service: Orthopedics;  Laterality: Right;  . ANTERIOR CERVICAL DECOMP/DISCECTOMY FUSION  2000   C5 - 7  . BACK SURGERY    . CARDIAC CATHETERIZATION  05-22-2008   DR SKAINS   NO SIGNIFECANT CAD/ NORMAL LV/  EF 65%/  NO WALL MOTION ABNORMALITIES  . CARPAL TUNNEL RELEASE Right 04-25-2013  . [place]; 1992  . COLON SURGERY    . COLONOSCOPY    . CYSTO N/A 04/30/2013   Procedure: CYSTOSCOPY;  Surgeon: Reece Packer, MD;  Location: [facility];  Service: Urology;  Laterality: N/A;  . CYSTOSCOPY MACROPLASTIQUE IMPLANT N/A 02/06/2018   Procedure: CYSTOSCOPY MACROPLASTIQUE IMPLANT;  Surgeon: Bjorn Loser, MD;  [facility];  Service: Urology;  Laterality: N/A;  . CYSTOSCOPY WITH INJECTION  05/04/2012   Procedure: CYSTOSCOPY WITH INJECTION;  Surgeon: Reece Packer, MD;  Location: [facility];  Service: Urology;  Laterality: N/A;  MACROPLASTIQUE INJECTION  . CYSTOSCOPY WITH INJECTION  08/28/2012   Procedure: CYSTOSCOPY WITH INJECTION;  Surgeon: Reece Packer, MD;  Location: [facility];  Service: Urology;  Laterality: N/A;  cysto and macroplastique   . FOOT SURGERY Bilateral    "related to [place]"  . HERNIA REPAIR  ?1996   "stomach"  . INCISION AND DRAINAGE OF WOUND Right 04/11/2018   Procedure: IRRIGATION AND DEBRIDEMENT WOUND right foot and right ankle;  Surgeon: Evelina Bucy, DPM;  Location: [facility];  Service: Podiatry;  Laterality: Right;  . KNEE ARTHROSCOPY Left   . KNEE ARTHROSCOPY W/ ALLOGRAFT IMPANT Left    graft x 2  . KNEE SURGERY     TOTAL 8 SURG'S  . LAPAROSCOPIC SIGMOID COLECTOMY N/A 04/22/2015   Procedure: LAPAROSCOPIC HAND ASSISTED SIGMOID COLECTOMY;  Surgeon: Erroll Luna, MD;  Location: [facility];  Service: General;  Laterality: N/A;  . LEFT HEART CATHETERIZATION WITH CORONARY ANGIOGRAM N/A 02/12/2014   Procedure: LEFT HEART CATHETERIZATION WITH CORONARY ANGIOGRAM;  Surgeon: Candee Furbish, MD;  Location: [facility]  CATH LAB;  Service: Cardiovascular;  Laterality: N/A;  No angiographically significant CAD; normal LVSF, LVEDP 9mmHg,  EF 55% (new finding ef 30% myoview 01-08-2014)  . LUMBAR FUSION    . MANDIBLE FRACTURE SURGERY    . MULTIPLE LAPAROSCOPIES FOR ENDOMETRIOSIS    . PUBOVAGINAL SLING  12/29/2011   Procedure: Gaynelle Arabian;  Surgeon: Reece Packer, MD;  Location: [facility];  Service: Urology;  Laterality: N/A;  cysto and sparc sling   . PUBOVAGINAL SLING N/A 04/30/2013   Procedure: REMOVAL OF VAGINAL MESH;  Surgeon: Reece Packer, MD;  Location[facility];  Service: Urology;  Laterality: N/A;  . RECONSTURCTION OF CONGENITAL UTERUS ANOMALY  1983  . REPEAT RECONSTRUCTION ACL LEFT KNEE/ SCREWS REMOVED  03-28-2000   CADAVER GRAFT  . TOTAL ABDOMINAL HYSTERECTOMY  1997   w/BSO  . TRANSTHORACIC ECHOCARDIOGRAM  02/13/2014   ef 45%, hypokinesis base inferior and base inferolateral walls  . UPPER GI ENDOSCOPY    . WOUND DEBRIDEMENT Right 09/05/2016   Procedure: DEBRIDEMENT WOUND WITH GRAFT RIGHT FOOT;  Surgeon: Landis Martins, DPM;  Location: [facility];  Service: Podiatry;  Laterality: Right;  . WRIST FRACTURE SURGERY     Social History   Occupational History  . Not on file  Tobacco Use  . Smoking status: Current Every Day Smoker    Packs/day: 1.50    Years: 48.00    Pack years: 72.00    Types: Cigarettes  . Smokeless tobacco: Never Used  . Tobacco comment: per pt started smoking  age 31  Substance and Sexual Activity  . Alcohol use: No    Comment: 05/24/2018 "special occasions only"  . Drug use: Not Currently  . Sexual activity: Yes

## 2018-07-03 NOTE — ED Notes (Signed)
Dr. Sabra Heck informed of critical lactic

## 2018-07-04 DIAGNOSIS — L03114 Cellulitis of left upper limb: Secondary | ICD-10-CM

## 2018-07-04 DIAGNOSIS — F339 Major depressive disorder, recurrent, unspecified: Secondary | ICD-10-CM

## 2018-07-04 DIAGNOSIS — N319 Neuromuscular dysfunction of bladder, unspecified: Secondary | ICD-10-CM

## 2018-07-04 DIAGNOSIS — Z79899 Other long term (current) drug therapy: Secondary | ICD-10-CM

## 2018-07-04 DIAGNOSIS — Z794 Long term (current) use of insulin: Secondary | ICD-10-CM

## 2018-07-04 DIAGNOSIS — E1142 Type 2 diabetes mellitus with diabetic polyneuropathy: Secondary | ICD-10-CM

## 2018-07-04 DIAGNOSIS — Z89511 Acquired absence of right leg below knee: Secondary | ICD-10-CM

## 2018-07-04 DIAGNOSIS — Z960 Presence of urogenital implants: Secondary | ICD-10-CM

## 2018-07-04 DIAGNOSIS — F419 Anxiety disorder, unspecified: Secondary | ICD-10-CM

## 2018-07-04 DIAGNOSIS — I4581 Long QT syndrome: Secondary | ICD-10-CM

## 2018-07-04 DIAGNOSIS — E1165 Type 2 diabetes mellitus with hyperglycemia: Secondary | ICD-10-CM

## 2018-07-04 DIAGNOSIS — Z8673 Personal history of transient ischemic attack (TIA), and cerebral infarction without residual deficits: Secondary | ICD-10-CM

## 2018-07-04 DIAGNOSIS — Z872 Personal history of diseases of the skin and subcutaneous tissue: Secondary | ICD-10-CM

## 2018-07-04 DIAGNOSIS — C189 Malignant neoplasm of colon, unspecified: Secondary | ICD-10-CM

## 2018-07-04 DIAGNOSIS — R6520 Severe sepsis without septic shock: Secondary | ICD-10-CM

## 2018-07-04 DIAGNOSIS — A419 Sepsis, unspecified organism: Principal | ICD-10-CM

## 2018-07-04 LAB — GLUCOSE, CAPILLARY
GLUCOSE-CAPILLARY: 190 mg/dL — AB (ref 70–99)
Glucose-Capillary: 296 mg/dL — ABNORMAL HIGH (ref 70–99)
Glucose-Capillary: 238 mg/dL — ABNORMAL HIGH (ref 70–99)
Glucose-Capillary: 378 mg/dL — ABNORMAL HIGH (ref 70–99)

## 2018-07-04 LAB — CBC
HCT: 39.7 % (ref 36.0–46.0)
Hemoglobin: 12.8 g/dL (ref 12.0–15.0)
MCH: 29.6 pg (ref 26.0–34.0)
MCHC: 32.2 g/dL (ref 30.0–36.0)
MCV: 91.7 fL (ref 78.0–100.0)
Platelets: 258 K/uL (ref 150–400)
RBC: 4.33 MIL/uL (ref 3.87–5.11)
RDW: 13.7 % (ref 11.5–15.5)
WBC: 11.6 K/uL — ABNORMAL HIGH (ref 4.0–10.5)

## 2018-07-04 LAB — URINE CULTURE: CULTURE: NO GROWTH

## 2018-07-04 LAB — BASIC METABOLIC PANEL
ANION GAP: 12 (ref 5–15)
BUN: 6 mg/dL (ref 6–20)
CO2: 23 mmol/L (ref 22–32)
Calcium: 8.4 mg/dL — ABNORMAL LOW (ref 8.9–10.3)
Chloride: 105 mmol/L (ref 98–111)
Creatinine, Ser: 0.59 mg/dL (ref 0.44–1.00)
GFR calc Af Amer: >60 mL/min (ref >60)
GLUCOSE: 236 mg/dL — AB (ref 70–99)
Potassium: 3.1 mmol/L — ABNORMAL LOW (ref 3.5–5.1)
SODIUM: 140 mmol/L (ref 135–145)

## 2018-07-04 LAB — LACTIC ACID, PLASMA
Lactic Acid, Venous: 2.2 mmol/L (ref 0.5–1.9)
Lactic Acid, Venous: 1.6 mmol/L (ref 0.5–1.9)

## 2018-07-04 LAB — HEMOGLOBIN A1C
HEMOGLOBIN A1C: 10.6 % — AB (ref 4.8–5.6)
MEAN PLASMA GLUCOSE: 258 mg/dL

## 2018-07-04 MED ORDER — COLLAGENASE 250 UNIT/GM EX OINT
TOPICAL_OINTMENT | Freq: Every day | CUTANEOUS | Status: DC
  Administered 2018-07-04: 13:00:00 via TOPICAL
  Administered 2018-07-05: 1 via TOPICAL
  Filled 2018-07-04 (×2): qty 30

## 2018-07-04 MED ORDER — POTASSIUM CHLORIDE CRYS ER 20 MEQ PO TBCR
40.0000 meq | EXTENDED_RELEASE_TABLET | Freq: Two times a day (BID) | ORAL | Status: AC
  Administered 2018-07-04 (×2): 40 meq via ORAL
  Filled 2018-07-04 (×2): qty 2

## 2018-07-04 MED ORDER — INSULIN GLARGINE 100 UNIT/ML ~~LOC~~ SOLN
18.0000 [IU] | Freq: Every day | SUBCUTANEOUS | Status: DC
  Administered 2018-07-04 – 2018-07-05 (×2): 18 [IU] via SUBCUTANEOUS
  Filled 2018-07-04 (×2): qty 0.18

## 2018-07-04 MED ORDER — LACTATED RINGERS IV BOLUS
1000.0000 mL | Freq: Once | INTRAVENOUS | Status: AC
  Administered 2018-07-04: 1000 mL via INTRAVENOUS

## 2018-07-04 MED ORDER — INSULIN ASPART 100 UNIT/ML ~~LOC~~ SOLN
6.0000 [IU] | Freq: Three times a day (TID) | SUBCUTANEOUS | Status: DC
  Administered 2018-07-04 – 2018-07-05 (×2): 6 [IU] via SUBCUTANEOUS

## 2018-07-04 MED ORDER — SODIUM CHLORIDE 0.9 % IV SOLN
INTRAVENOUS | Status: DC
  Administered 2018-07-04 (×2): via INTRAVENOUS

## 2018-07-04 NOTE — Progress Notes (Addendum)
Inpatient Diabetes Program Recommendations  AACE/ADA: New Consensus Statement on Inpatient Glycemic Control (2019)  Target Ranges:  Prepandial:   less than 140 mg/dL      Peak postprandial:   less than 180 mg/dL (1-2 hours)      Critically ill patients:  140 - 180 mg/dL   Results for CHALISA, KOBLER (MRN [id number]) as of 07/04/2018 12:05  Ref. Range 06/28/2018 14:47 07/03/2018 19:18 07/03/2018 22:24 07/04/2018 07:43  Glucose-Capillary Latest Ref Range: 70 - 99 mg/dL 270 (H) 366 (H) 390 (H) 296 (H)  Results for NANCYJO, GIVHAN (MRN [id number]) as of 07/04/2018 12:05  Ref. Range 03/19/2018 15:41 07/03/2018 18:35  Hemoglobin A1C Latest Ref Range: 4.8 - 5.6 % 9.2 10.6 (H)   Review of Glycemic Control  Diabetes history: DM2 Outpatient Diabetes medications: Jardiance 25 mg daily, Novolog 20 units 1-2 times per day; has Basaglar at home but is not taking  Current orders for Inpatient glycemic control: Novolog 0-20 units TID with meals, Novolog 0-5 units QHS  Inpatient Diabetes Program Recommendations: Insulin - Basal: Please consider ordering Lantus 20 units Q24H starting now. HgbA1C: A1C 10.6% on 07/03/18 indicating an average glucose of 258 mg/dl over the past 2-3 months.  NOTE: In reviewing chart, noted 6th admission in last 6 months. Patient was recently inpatient 05/23/18 to 05/25/18 and was seen by Diabetes Coordinator on 05/24/18 during that admission. It was noted patient should be taking Basaglar 36 units QHS along with Novolog and Jardiance for outpatient DM control. Patient sees Dr. Dwyane Dee for DM management. Per H&P on 07/03/18, "patient reports she checks her BG 2 times / day at home and it usually runs in the 300s. She reports being prescribed a long acting insulin that she doesn't take. She reports her Novolog use as when she remembers to check her BG, if its high, she gives herself 20 units. Denies taking Novolog with meals." Will plan to talk with patient today.  Addendum 07/04/18@14 :31-Spoke with  patient about diabetes and home regimen for diabetes control. Patient reports that she is followed by Dr. Dwyane Dee for diabetes management and currently she takes Jardiance 25 mg daily and Novolog 20 units 1-2 times per day as an outpatient for diabetes control. Inquired about basal insulin and patient reports that she has Basaglar at home but she doesn't take it. Inquired about barriers to taking the Basaglar and patient states that she forgets and just doesn't think about it. Discussed Novolog and Basaglar insulin and how they work and are typically taken. Explained that if only using Novolog insulin 1-2 times per day then insulin is not working for 24 hours to help keep DM controlled. Stressed importance of taking Engineer, agricultural as prescribed consistently and how it can help improve DM control.  Patient states that over the past few weeks her glucose has ranged from 180-300's mg/dl. Patient states that she would like to get a prescription for the [facility] reader and sensors and requested that attending MD supply Rx at time of discharge. Discussed A1C results (10.6% on 07/03/18) and explained that her current A1C indicates an average glucose of 258 mg/dl over the past 2-3 months. Discussed glucose and A1C goals. Discussed importance of checking CBGs and maintaining good CBG control to prevent long-term and short-term complications. Explained how hyperglycemia leads to damage within blood vessels which lead to the common complications seen with uncontrolled diabetes. Stressed to the patient the importance of improving glycemic control to prevent further complications from uncontrolled diabetes. Stressed importance of  glycemic control for wound healing.  Discussed impact of nutrition, exercise, stress, sickness, and medications on diabetes control. Encouraged patient to check her glucose 3-4 times per day, take insulin as prescribed by Dr. Dwyane Dee, to make a follow up appointment with Dr. Dwyane Dee, and to keep a log book of  glucose readings and insulin taken which she will need to take to doctor appointments.  Patient verbalized understanding of information discussed and she states that she has no further questions at this time related to diabetes.  Thanks, Barnie Alderman, RN, MSN, CDE Diabetes Coordinator Inpatient Diabetes Program [phone] (Team Pager from 8am to 5pm)

## 2018-07-04 NOTE — Discharge Summary (Signed)
Name: [name] MRN: [id number] DOB: 09/29/1966 52 y.o. PCP: Tamsen Roers, MD  Date of Admission: 07/03/2018 12:09 PM Date of Discharge: 07/05/18 Attending Physician: Oval Linsey, MD  Discharge Diagnosis: 1. Severe sepsis 2/2 skin infection 2. Uncontrolled T2DM   Discharge Medications: Allergies as of 07/05/2018      Reactions   Latex Itching, Rash, Other (See Comments)   Pt states she cannot use condoms - cause an infection.  Use of latex on skin is okay.  Tape causes rash   Sweetening Enhancer [flavoring Agent] Nausea And Vomiting, Other (See Comments)   HEADACHES   Aspartame And Phenylalanine Nausea And Vomiting   HEADACHES   Ibuprofen Other (See Comments)   HEADACHES   Trazodone And Nefazodone Other (See Comments)   Hallucinations   Triazolam Other (See Comments)   HALLUCINATIONS      Medication List    STOP taking these medications   cephALEXin 500 MG capsule Commonly known as:  KEFLEX   doxycycline 100 MG capsule Commonly known as:  VIBRAMYCIN     TAKE these medications   amoxicillin-clavulanate 875-125 MG tablet Commonly known as:  AUGMENTIN Take 1 tablet by mouth 2 (two) times daily for 7 days.   aspirin-acetaminophen-caffeine 250-250-65 MG tablet Commonly known as:  EXCEDRIN MIGRAINE Take 2 tablets by mouth every 6 (six) hours as needed for headache.   BASAGLAR KWIKPEN 100 UNIT/ML Sopn Inject 0.22 mLs (22 Units total) into the skin daily.   bisacodyl 5 MG EC tablet Commonly known as:  DULCOLAX Take 1 tablet (5 mg total) by mouth daily as needed for moderate constipation.   clonazePAM 1 MG tablet Commonly known as:  KLONOPIN Take 1 mg by mouth 3 (three) times daily as needed for anxiety or agitation.   cyclobenzaprine 10 MG tablet Commonly known as:  FLEXERIL Take 10 mg by mouth 3 (three) times daily as needed for muscle spasms.   gabapentin 600 MG tablet Commonly known as:  NEURONTIN Take 1 tablet (600 mg total) by mouth 2 (two)  times daily. Take in the morning and afternoon. Do not take at night time.   HETLIOZ 20 MG Caps Generic drug:  Tasimelteon Take 20 mg by mouth at bedtime.   INGREZZA 80 MG Caps Generic drug:  Valbenazine Tosylate Take 80 mg by mouth at bedtime.   insulin aspart 100 UNIT/ML injection Commonly known as:  novoLOG Inject 6 Units into the skin 3 (three) times daily with meals. Take extra dose according to the chart provided. What changed:    how much to take  additional instructions   JARDIANCE 25 MG Tabs tablet Generic drug:  empagliflozin Take 25 mg by mouth daily.   lidocaine 5 % Commonly known as:  LIDODERM Place 1 patch onto the skin daily as needed (pain). Remove & Discard patch within 12 hours or as directed by MD   mineral oil-hydrophilic petrolatum ointment Apply topically as needed for dry skin.   nortriptyline 10 MG capsule Commonly known as:  PAMELOR Take 10 mg by mouth at bedtime.   ondansetron 4 MG tablet Commonly known as:  ZOFRAN Take 1 tablet (4 mg total) by mouth every 6 (six) hours as needed for nausea.   Oxycodone HCl 10 MG Tabs Take 10 mg by mouth every 6 (six) hours as needed for severe pain.   oxyCODONE-acetaminophen 10-325 MG tablet Commonly known as:  PERCOCET Take 1 tablet by mouth every 8 (eight) hours as needed for pain.   pantoprazole 40  MG tablet Commonly known as:  PROTONIX Take 1 tablet (40 mg total) by mouth daily.   QUEtiapine 300 MG tablet Commonly known as:  SEROQUEL Take 600 mg by mouth at bedtime. What changed:  Another medication with the same name was removed. Continue taking this medication, and follow the directions you see here.   rOPINIRole 2 MG tablet Commonly known as:  REQUIP Take 4 mg by mouth 3 (three) times daily.   silver sulfADIAZINE 1 % cream Commonly known as:  SILVADENE Apply 1 application topically as needed.   tamsulosin 0.4 MG Caps capsule Commonly known as:  FLOMAX Take 0.4 mg by mouth daily.     VRAYLAR 4.5 MG Caps Generic drug:  Cariprazine HCl Take 4.5 mg by mouth at bedtime.       Disposition and follow-up:   [name] was discharged from [facility] in Stable condition.  At the hospital follow up visit please address:  1.  Insulin regimen. Please see below for details. Please monitor wound healing of L elbow and wrist carefully for infection.   2.  Labs / imaging needed at time of follow-up: NA  3.  Pending labs/ test needing follow-up: NA  Follow-up Appointments: Follow-up Information    [facility] [employer] Go to.   Why:  Please go to your appointment to see Dr. Sharol Given on Thursday July 25th, at 2:45pm  Contact information: [place] [address] [phone]        Tamsen Roers, MD Follow up in 1 week(s).   Specialty:  Family Medicine Why:  Please go to your follow up appointment with Dr. Rex Kras at [facility] on Tuesday July 16th at 1:30pm. Contact information: [address] [place] [address] [phone]        Noemi Chapel, NP. Go to.   Why:  Please keep your regularly schedule appointment with Vira Browns on July 25th at 1:00pm. Contact information: [address] [place] [address] [phone]        [facility]. Schedule an appointment as soon as possible for a visit.   Why:  Please call to make an appointment to follow up with Dr. Dwyane Dee at [facility] Endocrinology for diabetes. Contact information:  [address], [place], [place] [address]  [phone]          Hospital Course by problem list: 1.  Severe sepsis 2/2 skin infection Pt presented to the ED upon recommendation from her Orthopaedist, Dr. Sharol Given,  After an outpatient follow up for recent R BKA, with concern for L wrist and elbow infection. Upon arrival pt met criteria for severe sepsis with lactate of 4.05, leukocytosis of 18.4, and mild tachycardia with skin infections being  the most likely source. CXR and UA unremarkable. Pt has trauma to her elbows and knees d/t crawling around her house after BKA, complicated by uncontrolled T2DM with HbA1c of 10.6. Pt was fluid resuscitated and started on broad spectrum abx including Zosyn and Vancomycin. Ortho hand surgeon on call, Dr. Burney Gauze of [facility], evaluated the patient and does not believe she warrants surgical debridement at this time. Lactate has cleared and leukocytosis is resolved. Blood cx with no growth. Pt will be discharged today in stable condition with 7 day course of Augmentin.  2. Uncontrolled T2DM Pt exhibits poor health literacy, non-compliance to DM medications in setting of mental health illness as well as poor dietary habits  including regular soda. She endorses at home she has Engineer, agricultural, but never takes it. She states when she remembers to check her BG's, if it is high, she will take 20u Novolog without adjusting for sliding scale. HbA1c of 10.6 during this admission and seen by our diabetic educator. Pt was managed on basal / bolus insulin regimen, however regimen was not able to be closely titrated due to patient's short stay. We will discharge her with recommendation to take 22u of Basaglar nightly and 6u of Novolog with meals. We also recommend sliding scale Novolog for correction. We understand pt likely have difficulty with this regimen, so we mostly emphasized the importance of pts Basaglar. We will continue Empagliflozin. Pt is interested in [place] sensor to improve compliance. Recommend FU with PCP and Endocrinology.  Discharge Vitals:   BP (!) 162/92 (BP Location: Right Arm)   Pulse 90   Temp 99.4 F (37.4 C) (Oral)   Resp 19   Ht _0  (1.651 m)   Wt 81.2 kg (179 lb)   SpO2 99%   BMI 29.79 kg/m   Pertinent Labs, Studies, and Procedures:  WBC 18.4 --> 12.9 Lactate 4.05 --> 1.6 HbA1c 10.6   Discharge Instructions: Discharge Instructions    Call MD for:  redness,  tenderness, or signs of infection (pain, swelling, redness, odor or green/yellow discharge around incision site)   Complete by:  As directed    Call MD for:  temperature >100.4   Complete by:  As directed    Diet - low sodium heart healthy   Complete by:  As directed    Discharge instructions   Complete by:  As directed    [name], you were hospitalized for an infection of your wrist and elbow. We treated you with antibiotics and fluids. Please take the antibiotic we are prescribing for you, Augmentin, for 7 more days.  We talked about the importance of taking your insulin, so that you don't keep getting infections. Please start taking your Basaglar 22 units at night, this is very important. Also, please take 6 units of Novolog when you eat a meal. Do you best to check your blood sugars at home. Dr. Rex Kras and Dr. Dwyane Dee will help you with this.  You have lots of follow up appointments, please go to these appointments! You will see Meredith Mody and Lattie Haw the same day you see Dr. Sharol Given.   Increase activity slowly   Complete by:  As directed       Signed: Lari Linson, Earnest Conroy, Medical Student 07/05/2018, 11:39 AM   Pager: [phone]

## 2018-07-04 NOTE — Progress Notes (Signed)
CRITICAL VALUE ALERT  Critical Value:  Lactic acid  Date & Time Notied:  07/04/18 at 0630  Provider Notified: Yes  Orders Received/Actions taken: Notified by MD to continue to monitor pt

## 2018-07-04 NOTE — Progress Notes (Signed)
CRITICAL VALUE ALERT  Critical Value:  Lactic acid 2.2  Date & Time Notied:  07/04/18 0622  Provider Notified: MD oncall  Orders Received/Actions taken: No new orders

## 2018-07-04 NOTE — Progress Notes (Signed)
Subjective: Pts pain at infection sites on her L elbow and wrist are unchanged. Continues to endorse numbness, tingling of 4th and 5th digit in ulnar distribution as well as shooting pain in her hypothenar eminence. Denies nausea, vomiting, diarrhea. She is passing urine on her own without the Foley. Denies lower abdominal pani that was present yesterday. No BM thus far. Pt endorses taking her Klonopin 3-4 times weekly "when I remember it." She denies tremors or hallucinations. Pt is resistant to low carb diabetes diet, 2L regular Coke was observed 1/2 empty at bedside.   Objective:  Vital signs in last 24 hours: Vitals:   07/03/18 1745 07/03/18 1845 07/03/18 2229 07/04/18 0551  BP: (!) 159/71 (!) 153/68 (!) 152/71 138/71  Pulse: 92 92 88 80  Resp: (!) 21 20 17 17   Temp:  98.7 F (37.1 C) 98.3 F (36.8 C) 98.3 F (36.8 C)  TempSrc:  Oral Oral Oral  SpO2: 100% 100% 98% 99%   Gen: NAD, lying in bed eating Cardio: tachycardic, regular rhythm S1, S2, no murmur Resp: LCAB, no increased WOB Abd: non-distended, non-tender, +BS Derm: L elbow with 3.5 x 1.5cm ulcerated wound with wet granulomatous tissue, no pus, weeping of clear fluid, no crepitus, surrounding erythema marked with surgical marker             L wrist with longitudinal 3.5 x 1.5cm ulcerated wound with dark brown dry central eschar with wet granulation tissue, no crepitus, bright red surrounding erythema unchanged from yesterday             R BKA with 3 dry ulcerations following the scar line. Without pus. No weeping, surrounding erythema marked with surgical marker decreased from yesterday Neuro: loss of sensation in ulnar distribution - medial 4th and complete 5th digit. Flaccid 5th digit, unable to completely extend, 3/5 interosseous strength, L 4/5 interosseous motor function  Micro: Urine Cx: NGTD Blood Cx x 2: NGTD MRSA Nasal Swab: positive  Intake/Output Summary (Last 24 hours) at 07/04/2018 1159 Last data filed at  07/04/2018 7062 Gross per 24 hour  Intake 23.96 ml  Output 6675 ml  Net -6651.04 ml      Assessment/Plan: 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 Sharol Given, with concern for skin infection.  #Severe Sepsis 2/2 Skin Infection -Lactate >4, Leukocytosis, mild tachycardia -Highly likely source of infection is skin -  elbow, wrist, and BKA stump with reported purulent drainage - cellulitis vs gangrene, unlikely gas gangrene - hx uncontrolled DM -other sources much less likely, UA negative for infection, CXR unremarkable -plain films without evidence of ostemyelitis -s/p 2.5 L NS fluid resuscitation in the ED with no hypotension -s/p additional 1.5L - total 4L Plan: -continue Zosyn and vancomycin for broad coverage, including anerobes and pseudomonas in pt with uncontrolled diabetes, and +MRSA nasal swab -Ortho hand surgeon consulted - question for possible debridement -NPO until we get surgery recs -trend lactate -Percocet 10/325 q6h PRN pain - continued pts home regimen  #T2DM -glucose overnight >350 -HbA1c 10.6 -resistant to low carb diet, drinking regular soda in the room from outside of hospital Plan: -Lantus 18u qd -Novolog 6u TIDAC -high dose sliding scale TID, qHS  -Goal BG 140-180  #Neurogenic Bladder -Foley placement since 7/4 for urinary retention -appropriate UOP without Foley Plan:  -  monitor, low threshold for bladder scan and replacement of Foley if needed  #Prolonged QTc -limit QT prolonging medications  #MDD with hx of psychosis  -previously requiring inpatient psych hospitalizations -Psychiatrist - Dr. Lattie Haw Pollus at [facility] reports Klonopin use 3-4 x per week, less  concerned for withdrawal sxs Plan:  -continue Klonopin 1mg  PRN up to TID for anxiety -continue nortriptyline 10mg  qHS -continue gabapentin 600mg  BID  #DVT Ppx:  -Lovenox  #Dispo: Inpatient stay for severe sepsis secondary to skin source. Expected length of stay 24-48 hrs depending on if pt warrants surgery for debridement.  [name], [name], Medical Student 07/04/2018, 11:59 AM Pager [phone]

## 2018-07-04 NOTE — Consult Note (Signed)
[name] Nurse wound consult note Reason for Consult: Areas of eschar on left wrist and elbow, areas of eschar on right BKA stump. Patient was seen by Dr. Sharol Given yesterday and is to follow up with him in the office as directed. Is on systemic antibiotics. Erythema is less than indicated by the outline performed in the ED.  Wound type: Infectious, surgical Pressure Injury POA: N/A Measurement:  Right stump:  Scattered areas of eschar and dried serum, the largest of which measures 1cm x 2.5cm. Dry with small periwund erythema that is inside of line drawn in ED. Left elbow: 4cm x 2.5cm area with central wound covered in eschar measuring 2cm x 1.5cm (depth unable to be determined). Periwound erythema is improved. Small amount serous to light yellow drainage on old dressing. Left wrist: 2.5cm x 1.5cm area of eschar with periwound erythema and warmth (mild).  No induration. Entire areas measures 3.6cm x 3cm. Peeling epidermis. Wound bed:As described above Drainage (amount, consistency, odor) As described above Periwound:As described above Dressing procedure/placement/frequency: I will implement an enzymatic debriding agent (collagenase/Santyl) to the wrist and elbow and have Nurinsg paint the incision line and areas of eschar (vs. scab) with a betadine swabstick twice daily to provide antimicrobial and astringent properties. Patient would benefit from [facility] to oversee the collagenase initially and then for weekly or twice weekly follow up as other social issues may be preventing self care.  (See note that patient could not locate initial stump shrinker from Dr. Sharol Given). If you agree, please Order Case Management consult. Patient may benefit from Nutritional Consult while in house.  If you agree, please order. I reiterate the importance of smoking cessation to patient in this visit.  [place] nursing team will not follow, but will remain available to this patient, the nursing and medical teams.  Please re-consult if  needed. Thanks, Maudie Flakes, MSN, RN, [place], Arther Abbott  Pager# [phone]

## 2018-07-04 NOTE — H&P (Signed)
Internal Medicine Attending Admission Note Date: 07/04/2018  Patient name: [name] Medical record number: [id number] Date of birth: [birth year] Age: 52 y.o. Gender: female  I saw and evaluated the patient. I reviewed the resident's note and I agree with the resident's findings and plan as documented in the resident's note.  Chief Complaint(s): Infection of the left wrist and elbow area  History - key components related to admission:  Ms. 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 Jess Barters office because of a concern for a cellulitis of the left wrist and left elbow. The patient states that she fell in a parking lot 2 weeks ago and cut her left elbow.  The left wrist lesion apparently developed 1 week subsequently. She was seen in the emergency department on July 4 and was started on Keflex and doxycycline for these infections. Follow-up was scheduled in Dr. Jess Barters office and she presented there today. The practitioner was concerned about these wounds and referred her to the emergency department. The patient states the pain is considerable and radiates to the right hypo-thenar eminence and down the fourth and fifth digit. She also notes numbness of the fifth digit and half of the fourth digit of the left hand.  Also of note is the mention in the medical record that since her BKA she has been crawling around her house to get from one place to the other. She denies any fevers, shakes, or chills. Her blood glucose usually runs in the 300s at home as she is resistant to a diabetic diet. Because of the concerning look to the wound she was admitted to the internal medicine teaching service for further evaluation and care.  Physical Exam - key components  related to admission:  Vitals:   07/03/18 1745 07/03/18 1845 07/03/18 2229 07/04/18 0551  BP: (!) 159/71 (!) 153/68 (!) 152/71 138/71  Pulse: 92 92 88 80  Resp: (!) 21 20 17 17   Temp:  98.7 F (37.1 C) 98.3 F (36.8 C) 98.3 F (36.8 C)  TempSrc:  Oral Oral Oral  SpO2: 100% 100% 98% 99%   Gen.: Well-developed, well-nourished, woman lying comfortably in bed in no acute distress. She is cooperative with examination. Left elbow: 4 x 3 cm wound with central eschar and surrounding erythema. Left wrist: 3 x 2 cm wound with central dark eschar at the base and surrounding erythema. Right BKA stump: Series of dried eschar associated with surrounding erythema.  Lab results:  Basic Metabolic Panel: Recent Labs    07/03/18 1223 07/04/18 0456  NA 136 140  K 3.3* 3.1*  CL 101 105  CO2 22 23  GLUCOSE 157* 236*  BUN 6 6  CREATININE 0.56 0.59  CALCIUM 9.3 8.4*   Liver Function Tests: Recent Labs    07/03/18 1223  AST 21  ALT 17  ALKPHOS 97  BILITOT 0.4  PROT 6.5  ALBUMIN 3.3*   CBC: Recent Labs    07/03/18 1223 07/04/18 0456  WBC 18.4* 11.6*  NEUTROABS 12.8*  --   HGB 15.2* 12.8  HCT 46.1* 39.7  MCV 90.7 91.7  PLT 336 258   CBG: Recent Labs    07/03/18 1918 07/03/18 2224 07/04/18 0743  GLUCAP 366* 390* 296*   Hemoglobin A1C: Recent Labs  07/03/18 1835  HGBA1C 10.6*   Urinalysis:  Clear, straw-colored, specific gravity 1.007, pH 7.0, glucose greater than 500, hemoglobin large, nitrite negative, leukocytes negative, 0-5 red blood cells per high-power field, 6-10 white blood cells per high-power field  Misc. Labs:  Blood culture 2 pending Urine culture pending Lactic acid 2.08 -> 4.05 -> 2.2 -> 2.2 -> 2.2  Imaging results:   Left wrist: Personally reviewed. No evidence of overt osteomyelitis.  Left elbow: Personally reviewed. No evidence of overt osteomyelitis.  AP portable chest x-ray: Personally reviewed. No effusions, infiltrates, or masses.  There is evidence of prior cervical spine surgery.  Other results:  EKG: Personally reviewed. Normal sinus rhythm at 90 bpm, normal axis, prolonged QTc at 516 ms, no significant Q waves, no LVH by voltage, good R wave progression, no ST or T-wave changes. No significant change from the prior EKG on 05/22/2018.  Assessment & Plan by Problem:  Ms. 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 Jess Barters office because of a concern for a cellulitis of the left wrist and left elbow.  She has ulceration of the left wrist and elbow with eschar at the base. There is surrounding erythema to both of these lesions. There is no purulent drainage. Thus, she requires some debridement of these eschars as well and is therapy for the surrounding cellulitis.  1) Left elbow and wrist cellulitis with eschars:  She was started on vancomycin and Zosyn given her lactic acidosis and septic picture. Clinically her cellulitis has responded well to these. We will continue these medications pending culture results. We appreciate wound care's assessment and recommendations for debridement. We will follow these closely and consult case management to set up home services to also care for the wounds at home given her psychiatric issues that may make this more challenging otherwise. We are waiting orthopedic surgery to assess given that it was their office that was concerned about a vascular occlusion or the need for further intervention. If she continues to do well she will be discharged home on Augmentin to complete her course for her cellulitis while her eschars are being chemically debrided.  2) Disposition: If orthopedic surgery does not feel that surgical intervention or further  evaluation is necessary she will likely be discharged home tomorrow to complete a 7 day antibiotic course while undergoing chemical debridement of her eschars. Follow-up will be with her primary care provider, orthopedic surgery with Dr. Jess Barters office, and wound care.

## 2018-07-04 NOTE — Progress Notes (Signed)
Initial Nutrition Assessment  DOCUMENTATION CODES:   Not applicable  INTERVENTION:   Once PO diet advanced:  Prostat liquid protein po 30 ml BID with meals  Provides 200 kcal, 30 grams protein  NUTRITION DIAGNOSIS:   Increased nutrient needs related to wound healing as evidenced by estimated needs  GOAL:   Patient will meet greater than or equal to 90% of their needs  MONITOR:   Diet advancement, PO intake, Supplement acceptance, Labs, Skin, Weight trends, I & O's  REASON FOR ASSESSMENT:   Malnutrition Screening Tool  ASSESSMENT:   52 yo Female with a history of poorly controlled diabetes complicated by diabetic neuropathy, neurogenic bladder, and diabetic wounds 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 Jess Barters office because of a concern for a cellulitis of the left wrist and left elbow.   RN in with pt at time of RD visit. Did not disturb. Admitted with severe sepsis given skin infection. No % PO intake available per flowsheet records. CWOCN note reviewed.  Pt would benefit from addition of nutrition supplements once able. Labs and medications reviewed. CBG's [phone].  NUTRITION - FOCUSED PHYSICAL EXAM:  Unable to complete at this time.  Diet Order:   Diet Order           Diet NPO time specified  Diet effective now         EDUCATION NEEDS:   Not appropriate for education at this time  Skin:  Skin Assessment: Skin Integrity Issues: Skin Integrity Issues:: Other (Comment) Other: R stump - scattered areas of eschar, L elbow - central wound covered with eschar, L wrist - periwound with eschar and erythema   Last BM:  PTA  Height:   Ht Readings from Last 1 Encounters:  07/04/18 5\' 5"  (1.651 m)   Weight:   Wt Readings from Last 1 Encounters:  07/04/18 179 lb (81.2 kg)   BMI:  31.4 kg/m2 (adjusted for BKA)  Estimated Nutritional Needs:    Kcal:  1800-2000  Protein:  90-105 gm  Fluid:  1.8-2.0 L  Arthur Holms, RD, LDN Pager #: [phone] After-Hours Pager #: [phone]

## 2018-07-05 ENCOUNTER — Telehealth: Payer: Self-pay

## 2018-07-05 ENCOUNTER — Telehealth: Payer: Self-pay | Admitting: Endocrinology

## 2018-07-05 DIAGNOSIS — E11628 Type 2 diabetes mellitus with other skin complications: Secondary | ICD-10-CM

## 2018-07-05 DIAGNOSIS — Z888 Allergy status to other drugs, medicaments and biological substances status: Secondary | ICD-10-CM

## 2018-07-05 DIAGNOSIS — Z9111 Patient's noncompliance with dietary regimen: Secondary | ICD-10-CM

## 2018-07-05 DIAGNOSIS — Z91040 Latex allergy status: Secondary | ICD-10-CM

## 2018-07-05 DIAGNOSIS — Z8619 Personal history of other infectious and parasitic diseases: Secondary | ICD-10-CM

## 2018-07-05 DIAGNOSIS — Z89611 Acquired absence of right leg above knee: Secondary | ICD-10-CM

## 2018-07-05 DIAGNOSIS — Z22322 Carrier or suspected carrier of Methicillin resistant Staphylococcus aureus: Secondary | ICD-10-CM

## 2018-07-05 DIAGNOSIS — Z885 Allergy status to narcotic agent status: Secondary | ICD-10-CM

## 2018-07-05 DIAGNOSIS — Z9114 Patient's other noncompliance with medication regimen: Secondary | ICD-10-CM

## 2018-07-05 DIAGNOSIS — E11610 Type 2 diabetes mellitus with diabetic neuropathic arthropathy: Secondary | ICD-10-CM

## 2018-07-05 DIAGNOSIS — L089 Local infection of the skin and subcutaneous tissue, unspecified: Secondary | ICD-10-CM

## 2018-07-05 DIAGNOSIS — Z886 Allergy status to analgesic agent status: Secondary | ICD-10-CM

## 2018-07-05 DIAGNOSIS — Z9102 Food additives allergy status: Secondary | ICD-10-CM

## 2018-07-05 LAB — BASIC METABOLIC PANEL
Anion gap: 5 (ref 5–15)
BUN: 7 mg/dL (ref 6–20)
CO2: 28 mmol/L (ref 22–32)
Calcium: 8.6 mg/dL — ABNORMAL LOW (ref 8.9–10.3)
Chloride: 104 mmol/L (ref 98–111)
Creatinine, Ser: 0.52 mg/dL (ref 0.44–1.00)
GFR calc Af Amer: >60 mL/min (ref >60)
GLUCOSE: 235 mg/dL — AB (ref 70–99)
POTASSIUM: 3.7 mmol/L (ref 3.5–5.1)
Sodium: 137 mmol/L (ref 135–145)

## 2018-07-05 LAB — CBC
HCT: 38 % (ref 36.0–46.0)
Hemoglobin: 12.4 g/dL (ref 12.0–15.0)
MCH: 29.6 pg (ref 26.0–34.0)
MCHC: 32.6 g/dL (ref 30.0–36.0)
MCV: 90.7 fL (ref 78.0–100.0)
PLATELETS: 238 10*3/uL (ref 150–400)
RBC: 4.19 MIL/uL (ref 3.87–5.11)
RDW: 13.2 % (ref 11.5–15.5)
WBC: 12.9 10*3/uL — ABNORMAL HIGH (ref 4.0–10.5)

## 2018-07-05 LAB — GLUCOSE, CAPILLARY: Glucose-Capillary: 216 mg/dL — ABNORMAL HIGH (ref 70–99)

## 2018-07-05 MED ORDER — SODIUM CHLORIDE 0.9% FLUSH: 10.0000 mL | INTRAVENOUS | Status: DC | PRN

## 2018-07-05 MED ORDER — SODIUM CHLORIDE 0.9% FLUSH
10.0000 mL | INTRAVENOUS | Status: DC | PRN
  Administered 2018-07-05: 30 mL

## 2018-07-05 MED ORDER — INSULIN ASPART 100 UNIT/ML ~~LOC~~ SOLN: 6.0000 [IU] | Freq: Three times a day (TID) | SUBCUTANEOUS | 11 refills | Status: DC

## 2018-07-05 MED ORDER — AMOXICILLIN-POT CLAVULANATE 875-125 MG PO TABS: 1.0000 | ORAL_TABLET | Freq: Two times a day (BID) | ORAL | 0 refills | Status: AC

## 2018-07-05 MED ORDER — BASAGLAR KWIKPEN 100 UNIT/ML ~~LOC~~ SOPN: 22.0000 [IU] | PEN_INJECTOR | Freq: Every day | SUBCUTANEOUS | 3 refills | Status: DC

## 2018-07-05 MED ORDER — CLONAZEPAM 1 MG PO TABS
1.0000 mg | ORAL_TABLET | Freq: Once | ORAL | Status: AC | PRN
  Administered 2018-07-05: 1 mg via ORAL
  Filled 2018-07-05: qty 1

## 2018-07-05 NOTE — Progress Notes (Signed)
Inpatient Diabetes Program Recommendations  AACE/ADA: New Consensus Statement on Inpatient Glycemic Control (2019)  Target Ranges:  Prepandial:   less than 140 mg/dL      Peak postprandial:   less than 180 mg/dL (1-2 hours)      Critically ill patients:  140 - 180 mg/dL   Results for [name], TORTORELLI (MRN [id number]) as of 07/05/2018 09:51  Ref. Range 07/04/2018 07:43 07/04/2018 12:20 07/04/2018 17:08 07/04/2018 21:39 07/05/2018 07:44  Glucose-Capillary Latest Ref Range: 70 - 99 mg/dL 296 (H) 238 (H) 190 (H) 378 (H) 216 (H)   Results for [name], FEHER (MRN [id number]) as of 07/04/2018 12:05  Ref. Range 03/19/2018 15:41 07/03/2018 18:35  Hemoglobin A1C Latest Ref Range: 4.8 - 5.6 % 9.2 10.6 (H)   Review of Glycemic Control  Diabetes history: DM2 Outpatient Diabetes medications: Jardiance 25 mg daily, Novolog 20 units 1-2 times per day; has Basaglar at home but is not taking  Current orders for Inpatient glycemic control: Lantus 18 units daily, Novolog 6 units TID with meals, Novolog 0-20 units TID with meals, Novolog 0-5 units QHS  Inpatient Diabetes Program Recommendations: Insulin - Basal: Please consider increasing Lantus to 23 units daily. Insulin-Meal Coverage: Please consider increasing meal coverage to Novolog 8 units TID with meals.  Thanks, Barnie Alderman, RN, MSN, CDE Diabetes Coordinator Inpatient Diabetes Program [phone] (Team Pager from 8am to 5pm)

## 2018-07-05 NOTE — Telephone Encounter (Signed)
LM for pt to call back to make an appt for her

## 2018-07-05 NOTE — Telephone Encounter (Signed)
Patients husband called to get patient scheduled for hospital f/u but we got disconnected

## 2018-07-05 NOTE — Progress Notes (Signed)
Pt given discharge instructions, prescriptions, and care notes. Pt verbalized understanding AEB no further questions or concerns at this time. IV was discontinued, no redness, pain, or swelling noted at this time. Telemetry discontinued and Centralized Telemetry was notified. Pt left the floor via wheelchair with staff in stable condition. 

## 2018-07-05 NOTE — Progress Notes (Signed)
Subjective: Pt endorses pain at her infection sites on her L arm, unchanged from yesterday. Numbness tinging of L 4th and 5th digits also unchanged. Pt has had BM and is urinating well. Denies nausea, vomiting. Discussed importance of diabetes control to prevent further infections.  Objective:  Vital signs in last 24 hours: Vitals:   07/04/18 0551 07/04/18 1405 07/04/18 2142 07/05/18 0800  BP: 138/71  (!) 149/92 (!) 162/92  Pulse: 80  90 90  Resp: 17  18 19   Temp: 98.3 F (36.8 C)  99.2 F (37.3 C) 99.4 F (37.4 C)  TempSrc: Oral  Oral Oral  SpO2: 99%  98% 99%  Weight:  81.2 kg (179 lb)    Height:  5\' 5"  (1.651 m)      Gen: NAD, lying in bed eating Cardio: RRR, S1, S2, no murmur Resp: LCAB, no increased WOB Abd: non-distended, non-tender Derm: L elbow with 3.5 x 1.5cm ulcerated wound with wet granulomatous tissue, no pus, serous weeping, no crepitus, surrounding erythema marked with surgical marker, decreased from yesterday L wrist with longitudinal 3.5 x 1.5cm ulcerated wound with dark brown dry central eschar with dry granulation tissue, no crepitus, bright red surrounding erythema decreased from yesterday R BKA with 3 dry ulcerations following the scar line. Without pus. No weeping, surrounding erythema marked with surgical marker decreased from yesterday Neuro: loss of sensation in ulnar distribution - medial 4th and complete 5th digit. Flaccid 5th digit, unable to completely extend, 3/5 interosseous strength, L 4/5 interosseous motor function  Micro: Urine Cx: NGTD Blood Cx x 2: NGTD MRSA Nasal Swab: positive     Intake/Output Summary (Last 24 hours) at 07/05/2018 1106 Last data filed at 07/05/2018 1029 Gross per 24 hour  Intake 1275.26 ml  Output 1950 ml  Net -674.74 ml    Assessment/Plan: Ms. 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 Sharol Given, with concern for skin infection.  #Severe Sepsis2/2 Skin Infection -SIRS Criteria: Lactate >4, Leukocytosis, mild tachycardia -Highly likely source of infectionis skin -elbow, wrist, and BKA stump with reported purulent drainage - cellulitis vs gangrene, unlikely gas gangrene - hx uncontrolled DM -other sources much less likely, UA negative for infection, CXR unremarkable -plain films without evidence of ostemyelitis -s/p 4L fluid resuscitation, lactate now WNL -Ortho evaluated with no plans for debridement Plan: -continue Zosyn and vancomycin for broad coverage, including anerobesandpseudomonas in pt with uncontrolled diabetes, and +MRSA nasal swab -will discharge with 7 days po Augmentin 875mg  BID -Percocet 10/325 q6h PRN pain - continued pts home regimen  #T2DM -glucose overnight 200-400 -HbA1c 10.6 -resistant to low carb diet, drinking regular soda in the room from outside of hospital Plan: -Lantus 18u qd -Novolog 6u TIDAC -high dose sliding scale TID, qHS  -Goal BG 140-180 -will transition to home regimen as described in DC summary  #Neurogenic Bladder -home Foley since 7/4 for urinary retention, removed 7/9 upon admission -appropriate UOP without Foley Plan:  -monitor, low threshold for bladder scan and replacement of Foley if needed  #Prolonged QTc -limit QT prolonging medications  #MDD withhx ofpsychosis  -previously requiring inpatient psych hospitalizations -Psychiatrist - Dr. Lattie Haw Pollus at [place] reports Klonopin use 3-4 x per week, less concerned for withdrawal sxs Plan:  -continue Klonopin 1mg  PRN up to TID for  anxiety -continue nortriptyline 10mg  qHS -continue gabapentin 600mg  BID  #DVT Ppx:  -Lovenox  #Dispo: Inpatient stay for severe sepsis  secondary to skin source. Discharge today, expected LOS <24hrs.   Alise Calais, Earnest Conroy, Medical Student 07/05/2018, 11:06 AM Pager [phone]

## 2018-07-05 NOTE — Plan of Care (Signed)
Patient is making progress toward discharge goal

## 2018-07-05 NOTE — Telephone Encounter (Signed)
-----   Message from Elayne Snare, MD sent at 07/05/2018 10:22 AM EDT ----- Regarding: RE: Hospital Follow up We will need to accommodate her  ----- Message ----- From: Princess Bruins Sent: 07/04/2018   4:21 PM To: Elayne Snare, MD Subject: Hospital Follow up                             Hospital called to schedule a follow up visit for patient. And they would like her to come in within the next 2-3 weeks.  There was an opening tomorrow but patient can not come to that appointment and they would like know if you could have a spot opened for her Thanks!

## 2018-07-09 LAB — CULTURE, BLOOD (ROUTINE X 2)
CULTURE: NO GROWTH
Culture: NO GROWTH
SPECIAL REQUESTS: ADEQUATE

## 2018-07-09 NOTE — Telephone Encounter (Signed)
Received notice from [employer] [place] [employer] [place] stating the [place] 09/25/2014 form sent was out dated and sent new form DMA 5031 05/04/2018.

## 2018-07-09 NOTE — Telephone Encounter (Signed)
Completed the New DMA 3051 form, faxed with demographics and LOV 06/26/2018.

## 2018-07-09 NOTE — Telephone Encounter (Signed)
Faxed Request for Independent Assessment for Personal Care Services [facility]) Attestation of Medical Need form to [facility] Dept. Of Health and [employer].

## 2018-07-13 DIAGNOSIS — E1142 Type 2 diabetes mellitus with diabetic polyneuropathy: Secondary | ICD-10-CM

## 2018-07-13 DIAGNOSIS — Z743 Need for continuous supervision: Secondary | ICD-10-CM

## 2018-07-17 ENCOUNTER — Ambulatory Visit: Payer: Self-pay | Admitting: Endocrinology

## 2018-07-17 ENCOUNTER — Other Ambulatory Visit: Payer: Self-pay | Admitting: Neurology

## 2018-07-17 DIAGNOSIS — Z0289 Encounter for other administrative examinations: Secondary | ICD-10-CM

## 2018-07-18 ENCOUNTER — Ambulatory Visit (INDEPENDENT_AMBULATORY_CARE_PROVIDER_SITE_OTHER): Payer: Medicare Other | Admitting: Family

## 2018-07-19 ENCOUNTER — Ambulatory Visit (INDEPENDENT_AMBULATORY_CARE_PROVIDER_SITE_OTHER): Payer: Medicare Other | Admitting: Orthopedic Surgery

## 2018-07-25 ENCOUNTER — Ambulatory Visit (INDEPENDENT_AMBULATORY_CARE_PROVIDER_SITE_OTHER): Payer: Medicare Other | Admitting: Orthopedic Surgery

## 2018-07-27 ENCOUNTER — Other Ambulatory Visit: Payer: Self-pay | Admitting: Endocrinology

## 2018-07-27 NOTE — Telephone Encounter (Signed)
Needs to make appointment for any refills

## 2018-07-27 NOTE — Telephone Encounter (Signed)
Refill or deny? 

## 2018-07-30 ENCOUNTER — Ambulatory Visit (INDEPENDENT_AMBULATORY_CARE_PROVIDER_SITE_OTHER): Payer: Medicare Other | Admitting: Orthopedic Surgery

## 2018-07-31 ENCOUNTER — Other Ambulatory Visit: Payer: Self-pay | Admitting: Endocrinology

## 2018-08-05 ENCOUNTER — Other Ambulatory Visit: Payer: Self-pay | Admitting: Endocrinology

## 2018-08-06 NOTE — Telephone Encounter (Signed)
Ok to refill 

## 2018-08-06 NOTE — Telephone Encounter (Signed)
Refuse prescription with note to make appointment

## 2018-08-09 ENCOUNTER — Telehealth (INDEPENDENT_AMBULATORY_CARE_PROVIDER_SITE_OTHER): Payer: Self-pay | Admitting: Orthopedic Surgery

## 2018-08-09 ENCOUNTER — Other Ambulatory Visit: Payer: Self-pay

## 2018-08-09 DIAGNOSIS — S88119D Complete traumatic amputation at level between knee and ankle, unspecified lower leg, subsequent encounter: Secondary | ICD-10-CM

## 2018-08-09 MED ORDER — GLUCOSE BLOOD VI STRP: ORAL_STRIP | 3 refills | Status: DC

## 2018-08-09 NOTE — Telephone Encounter (Signed)
Patient left a voicemial and is needing to be set up for home health physical therapy since she received her prosthetic leg. Patients #  [phone]

## 2018-08-09 NOTE — Telephone Encounter (Signed)
With prosthesis patient needs to be given a prescription for outpatient gait training with Robin at [facility] neuro rehab.

## 2018-08-09 NOTE — Telephone Encounter (Signed)
Please advise 

## 2018-08-10 NOTE — Telephone Encounter (Signed)
IC patient and advised, I entered referral for this as well.

## 2018-08-13 ENCOUNTER — Other Ambulatory Visit: Payer: Self-pay | Admitting: Neurology

## 2018-08-14 ENCOUNTER — Other Ambulatory Visit: Payer: Self-pay

## 2018-08-14 ENCOUNTER — Telehealth (INDEPENDENT_AMBULATORY_CARE_PROVIDER_SITE_OTHER): Payer: Self-pay | Admitting: Orthopedic Surgery

## 2018-08-14 NOTE — Telephone Encounter (Signed)
Called and lm on vm to advise verbal ok for physical therapy for prosthetic training

## 2018-08-14 NOTE — Telephone Encounter (Signed)
Caryl Pina with [facility] called needing verbal order to work with patient on right leg prosthesis (patient eval) the number to contact Caryl Pina is [phone]

## 2018-08-17 ENCOUNTER — Telehealth: Payer: Self-pay | Admitting: Sports Medicine

## 2018-08-17 NOTE — Telephone Encounter (Signed)
This is [name], PT with [facility]. I would like to get a verbal okay to see her twice a week for four weeks starting next week to do a prosthetic training and also home safety education. If you would give me a call back please at [phone]. Thank you.

## 2018-08-17 NOTE — Telephone Encounter (Addendum)
Dr. Marcene Duos recommended Prosthetic training and home safety. Left message okaying the recommended [place] PT.

## 2018-08-23 ENCOUNTER — Telehealth: Payer: Self-pay

## 2018-08-23 NOTE — Telephone Encounter (Signed)
Called patient to get blood sugar readings per Dr. Dwyane Dee but I did not get an answer. I left VM requesting patient call back to give blood sugar readings

## 2018-08-23 NOTE — Telephone Encounter (Signed)
Needs to be seen sooner than October

## 2018-08-23 NOTE — Telephone Encounter (Signed)
Called and left another message for Caryl Pina in attempt to get the blood sugar readings for this patient

## 2018-08-24 ENCOUNTER — Telehealth: Payer: Self-pay | Admitting: Family Medicine

## 2018-08-24 NOTE — Telephone Encounter (Signed)
Will you please see if you can get pt scheduled before October?

## 2018-08-24 NOTE — Telephone Encounter (Signed)
Spoke to [employer] with [facility] and she stated that pt blood sugars have been reading high(no # meter just read high) twice yesterday and today blood sugar was 444 pt took 6 units of insulin per sliding scale and no meal coverage this morning. She stated that pt seems to be non compliant and she would like to know if you would like to make any adjustments for pt, please advise

## 2018-08-24 NOTE — Telephone Encounter (Signed)
Caryl Pina from [facility] called regarding pt's blood sugar levels. Can be reached on [phone]. States her readings were on high.

## 2018-08-24 NOTE — Telephone Encounter (Signed)
Confirm that she is taking 36 units of Basaglar daily.  Need to see if she can come in sooner than October for follow-up otherwise would like her to set up her appointment with Christean Grief

## 2018-08-24 NOTE — Telephone Encounter (Signed)
lft vm for Caryl Pina to return call

## 2018-09-11 ENCOUNTER — Telehealth (INDEPENDENT_AMBULATORY_CARE_PROVIDER_SITE_OTHER): Payer: Self-pay | Admitting: Orthopedic Surgery

## 2018-09-11 NOTE — Telephone Encounter (Signed)
Have her come in to be seen

## 2018-09-11 NOTE — Telephone Encounter (Signed)
[name], nurse from [facility] called to let Dr. Sharol Given know that the patient has a new spot on her leg.  CB#[phone].  Thank you.

## 2018-09-11 NOTE — Telephone Encounter (Signed)
fyi

## 2018-09-12 NOTE — Telephone Encounter (Signed)
I left voicemail for patient to call and make an appointment to be evaluated. I called [name] and advised of plan.

## 2018-09-17 ENCOUNTER — Encounter (INDEPENDENT_AMBULATORY_CARE_PROVIDER_SITE_OTHER): Payer: Self-pay | Admitting: Orthopedic Surgery

## 2018-09-17 ENCOUNTER — Ambulatory Visit (INDEPENDENT_AMBULATORY_CARE_PROVIDER_SITE_OTHER): Payer: Medicare Other | Admitting: Orthopedic Surgery

## 2018-09-17 DIAGNOSIS — Z89511 Acquired absence of right leg below knee: Secondary | ICD-10-CM | POA: Diagnosis not present

## 2018-09-17 DIAGNOSIS — I96 Gangrene, not elsewhere classified: Secondary | ICD-10-CM | POA: Insufficient documentation

## 2018-09-17 MED ORDER — OXYCODONE-ACETAMINOPHEN 10-325 MG PO TABS: 1.0000 | ORAL_TABLET | ORAL | 0 refills | Status: DC | PRN

## 2018-09-17 MED ORDER — DOXYCYCLINE HYCLATE 100 MG PO TABS: 100.0000 mg | ORAL_TABLET | Freq: Two times a day (BID) | ORAL | 0 refills | Status: DC

## 2018-09-17 NOTE — Progress Notes (Signed)
Office Visit Note   Patient: [name]           Date of Birth: 11/24/1966           MRN: [id number] Visit Date: 09/17/2018              Requested by: Tamsen Roers, [place], [place] [address] PCP: Tamsen Roers, MD  Chief Complaint  Patient presents with  . Right Leg - Pain      HPI: Patient is a 52 year old woman who is status post right transtibial amputation she still has 1 persistent wound complains of pain.  Patient currently ambulates in a wheelchair she is not on antibiotics.  Assessment & Plan: Visit Diagnoses:  1. Acquired absence of right leg below knee [facility])     Plan: We will place patient on doxycycline she states she is also going to start a medicine for a urinary tract infection.  Reevaluate in 3 weeks.  Patient will start Silvadene dressing changes to the wound daily with Dial soap cleansing continue with her stump shrinker.  Follow-Up Instructions: Return in about 3 weeks (around 10/08/2018).   Ortho Exam  Patient is alert, oriented, no adenopathy, well-dressed, normal affect, normal respiratory effort. Examination patient is a very small drop of purulence that suggested date adjacent to the wound.  This was probed with a silver nitrate stick I cannot drain any more fluid.  The wound is 5 mm in diameter 3 mm deep this does not probe to bone or tendon there is no surrounding cellulitis there is good hair growth there is no redness.  Imaging: No results found. No images are attached to the encounter.  Labs: Lab Results  Component Value Date   HGBA1C 10.6 (H) 07/03/2018   HGBA1C 9.2 03/19/2018   HGBA1C 10.0 12/11/2017   ESRSEDRATE 55 (H) 02/19/2016   CRP 17.4 (H) 02/19/2016   LABURIC 5.8 02/19/2016   REPTSTATUS 07/04/2018 FINAL 07/03/2018   GRAMSTAIN  04/11/2018    RARE WBC PRESENT, PREDOMINANTLY PMN NO ORGANISMS SEEN    CULT  07/03/2018    NO GROWTH Performed at [facility], [place] [address] [place], [place] [address]    LABORGA ESCHERICHIA COLI (A) 05/24/2018     Lab Results  Component Value Date   ALBUMIN 3.3 (L) 07/03/2018   ALBUMIN 3.3 (L) 06/28/2018   ALBUMIN 3.8 05/22/2018   LABURIC 5.8 02/19/2016    There is no height or weight on file to calculate BMI.  Orders:  No orders of the defined types were placed in this encounter.  Meds ordered this encounter  Medications  . doxycycline (VIBRA-TABS) 100 MG tablet    Sig: Take 1 tablet (100 mg total) by mouth 2 (two) times daily.    Dispense:  60 tablet    Refill:  0  . oxyCODONE-acetaminophen (PERCOCET) 10-325 MG tablet    Sig: Take 1 tablet by mouth every 4 (four) hours as needed for pain.    Dispense:  21 tablet    Refill:  0     Procedures: No procedures performed  Clinical Data: No additional findings.  ROS:  All other systems negative, except as noted in the HPI. Review of Systems  Objective: Vital Signs: There were no vitals taken for this visit.  Specialty Comments:  No specialty comments available.  PMFS History: Patient Active Problem List   Diagnosis Date Noted  . Cellulitis of left upper extremity   . Poorly controlled  diabetes mellitus ([place])   . UTI (urinary tract infection) 05/23/2018  . Acute metabolic encephalopathy 40/98/1191  . Toxic encephalopathy 05/09/2018  . GERD (gastroesophageal reflux disease) 05/09/2018  . Tobacco abuse 05/09/2018  . Polypharmacy 05/09/2018  . Restless leg syndrome 04/27/2018  . Anxiety 04/27/2018  . Hyponatremia 04/26/2018  . Noncompliance with dietary restriction 04/26/2018  . Postoperative cellulitis of surgical wound 04/26/2018  . At high risk for falls 04/24/2018  . Noncompliance with safety precautions 04/24/2018  . Leukocytosis 04/24/2018  . Unilateral complete BKA ([place]) 04/16/2018  . PAD (peripheral artery disease) ([place])   . Poorly controlled type 2 diabetes mellitus ([place])   . Subacute osteomyelitis, right ankle and foot ([place])   . Major depressive disorder,  recurrent episode ([place]) 04/11/2018  . Diabetic ulcer of right midfoot associated with type 2 diabetes mellitus, with necrosis of bone ([place])   . Abscess of ankle   . Infectious synovitis   . Diabetic foot infection ([place])   . Sepsis ([place]) 04/09/2018  . Cancer of sigmoid colon ([place]) 03/09/2015  . OSA (obstructive sleep apnea) 07/04/2014  . Migraine without aura, with intractable migraine, so stated, without mention of status migrainosus 03/26/2014  . Peripheral neuropathy 03/03/2014  . Abnormal stress test 02/12/2014  . CVA (cerebral infarction) 02/12/2014  . Chest pain   . Abnormal heart rhythm   . Edema   . Hypertension   . Hyperlipemia   . Hypercholesterolemia   . Other and unspecified hyperlipidemia 11/14/2013  . Type II diabetes mellitus, uncontrolled ([place]) 07/22/2013  . Essential hypertension 07/22/2013  . Diabetic neuropathy with neurologic complication ([place]) 47/82/9562  . Diabetic polyneuropathy ([place]) 07/22/2013   Past Medical History:  Diagnosis Date  . Anxiety   . Arthritis   . Bipolar disorder ([place])   . Broken jaw ([place])   . Broken wrist   . Chronic back pain   . Claudication ([place])    a. 12/2013 ABI's: R 0.97, L 0.94.  [name] COPD (chronic obstructive pulmonary disease) ([place])   . Depression   . Diabetic Charcot's foot ([place])   . Diabetic foot ulcer (HCC)    chronic left foot ulcer , great toe/  hx recurrent foot ulcer bilaterally  . DJD (degenerative joint disease)   . Edema of both lower extremities   . Episode of memory loss   . GERD (gastroesophageal reflux disease)   . Headache(784.0)   . Hiatal hernia   . History of colon cancer, stage I dx 02-26-2015--- oncologist-- dr [name]--- per last note no recurrence   05-16-2015 s/p  Laparoscopic sigmoid colectomy w/ node bx's (negative nodes per path)  Stage I (pT1,N0,M0) Grade 2  . 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   per MRI multiple small strokes post cardiac cath. --  residual mild  memory loss  . History of methicillin resistant staphylococcus aureus (MRSA)   . Hypercholesterolemia   . Insomnia   . Insulin dependent type 2 diabetes mellitus, uncontrolled ([place]) dx 2004   endocrinologist-  dr Dwyane Dee-- last A1c 8.8 in Aug2018:  pt is noncompliant w/ diet, states does note eat breakfast , her first main meal in afternoon  . Neurogenic bladder   . Neuropathy, diabetic ([place])    hands and feet  . OSA (obstructive sleep apnea)    cpap intolerant  . Personality disorder ([place])   . Restless leg syndrome   . SOB (shortness of breath) on exertion   . Stroke ([place])   .  SUI (stress urinary incontinence, female) S/P SLING 12-29-2011    Family History  Problem Relation Age of Onset  . Hypertension Mother   . Diabetes Mother   . Cancer - Other Mother        lymphoma   . Cancer - Other Father        lung, bladder cancer   . Heart attack Father   . Cancer - Other Brother        bladder cancer     Past Surgical History:  Procedure Laterality Date  . AMPUTATION Right 04/13/2018   Procedure: RIGHT BELOW KNEE AMPUTATION;  Surgeon: Newt Minion, MD;  Location: [place];  Service: Orthopedics;  Laterality: Right;  . ANTERIOR CERVICAL DECOMP/DISCECTOMY FUSION  2000   C5 - 7  . BACK SURGERY    . CARDIAC CATHETERIZATION  05-22-2008   DR SKAINS   NO SIGNIFECANT CAD/ NORMAL LV/  EF 65%/  NO WALL MOTION ABNORMALITIES  . CARPAL TUNNEL RELEASE Right 04-25-2013  . [place]; 1992  . COLON SURGERY    . COLONOSCOPY    . CYSTO N/A 04/30/2013   Procedure: CYSTOSCOPY;  Surgeon: Reece Packer, MD;  [facility];  Service: Urology;  Laterality: N/A;  . CYSTOSCOPY MACROPLASTIQUE IMPLANT N/A 02/06/2018   Procedure: CYSTOSCOPY MACROPLASTIQUE IMPLANT;  Surgeon: Bjorn Loser, MD;  [facility];  Service: Urology;  Laterality: N/A;  . CYSTOSCOPY WITH INJECTION  05/04/2012   Procedure: CYSTOSCOPY WITH INJECTION;  Surgeon: Reece Packer, MD;   Location: [place];  Service: Urology;  Laterality: N/A;  MACROPLASTIQUE INJECTION  . CYSTOSCOPY WITH INJECTION  08/28/2012   Procedure: CYSTOSCOPY WITH INJECTION;  Surgeon: Reece Packer, MD;  Location: [facility];  Service: Urology;  Laterality: N/A;  cysto and macroplastique   . FOOT SURGERY Bilateral    "related to [place]"  . HERNIA REPAIR  ?1996   "stomach"  . INCISION AND DRAINAGE OF WOUND Right 04/11/2018   Procedure: IRRIGATION AND DEBRIDEMENT WOUND right foot and right ankle;  Surgeon: Evelina Bucy, DPM;  [facility];  Service: Podiatry;  Laterality: Right;  . KNEE ARTHROSCOPY Left   . KNEE ARTHROSCOPY W/ ALLOGRAFT IMPANT Left    graft x 2  . KNEE SURGERY     TOTAL 8 SURG'S  . LAPAROSCOPIC SIGMOID COLECTOMY N/A 04/22/2015   Procedure: LAPAROSCOPIC HAND ASSISTED SIGMOID COLECTOMY;  Surgeon: Erroll Luna, MD;  Location: [place];  Service: General;  Laterality: N/A;  . LEFT HEART CATHETERIZATION WITH CORONARY ANGIOGRAM N/A 02/12/2014   Procedure: LEFT HEART CATHETERIZATION WITH CORONARY ANGIOGRAM;  Surgeon: Candee Furbish, MD;  Location: [facility] C[facility] LAB;  Service: Cardiovascular;  Laterality: N/A;   No angiographically significant CAD; normal LVSF, LVEDP 5mmHg,  EF 55% (new finding ef 30% myoview 01-08-2014)  . LUMBAR FUSION    . MANDIBLE FRACTURE SURGERY    . MULTIPLE LAPAROSCOPIES FOR ENDOMETRIOSIS    . PUBOVAGINAL SLING  12/29/2011   Procedure: Gaynelle Arabian;  Surgeon: Reece Packer, MD;  Location: [facility];  Service: Urology;  Laterality: N/A;  cysto and sparc sling   . PUBOVAGINAL SLING N/A 04/30/2013   Procedure: REMOVAL OF VAGINAL MESH;  Surgeon: Reece Packer, MD;  [facility];  Service: Urology;  Laterality: N/A;  . RECONSTURCTION OF CONGENITAL UTERUS ANOMALY  1983  . REPEAT RECONSTRUCTION ACL LEFT KNEE/ SCREWS REMOVED  03-28-2000   CADAVER GRAFT  . TOTAL  ABDOMINAL HYSTERECTOMY  1997   w/BSO  .  TRANSTHORACIC ECHOCARDIOGRAM  02/13/2014   ef 45%, hypokinesis base inferior and base inferolateral walls  . UPPER GI ENDOSCOPY    . WOUND DEBRIDEMENT Right 09/05/2016   Procedure: DEBRIDEMENT WOUND WITH GRAFT RIGHT FOOT;  Surgeon: Landis Martins, DPM;  Location: [place];  Service: Podiatry;  Laterality: Right;  . WRIST FRACTURE SURGERY     Social History   Occupational History  . Not on file  Tobacco Use  . Smoking status: Current Every Day Smoker    Packs/day: 1.50    Years: 48.00    Pack years: 72.00    Types: Cigarettes  . Smokeless tobacco: Never Used  . Tobacco comment: per pt started smoking  age 38  Substance and Sexual Activity  . Alcohol use: No    Comment: 05/24/2018 "special occasions only"  . Drug use: Not Currently  . Sexual activity: Yes

## 2018-09-18 ENCOUNTER — Telehealth (INDEPENDENT_AMBULATORY_CARE_PROVIDER_SITE_OTHER): Payer: Self-pay | Admitting: Orthopedic Surgery

## 2018-09-18 NOTE — Telephone Encounter (Signed)
[name] called wanting an order for what should be done for wound care. They have no orders. Patient was seen yesterday in office.  Please call [name] [phone]

## 2018-09-19 NOTE — Telephone Encounter (Signed)
IC advised [name] per Dr Jess Barters note from yesterday patient was to start Silvadene dressing changes to the wound daily with Dial soap cleansing continue with her stump shrinker.

## 2018-09-25 ENCOUNTER — Ambulatory Visit: Payer: Medicare Other | Admitting: Sports Medicine

## 2018-09-27 ENCOUNTER — Ambulatory Visit: Payer: Self-pay | Admitting: Endocrinology

## 2018-09-27 ENCOUNTER — Telehealth (INDEPENDENT_AMBULATORY_CARE_PROVIDER_SITE_OTHER): Payer: Self-pay | Admitting: Orthopedic Surgery

## 2018-09-27 ENCOUNTER — Telehealth: Payer: Self-pay

## 2018-09-27 DIAGNOSIS — Z89511 Acquired absence of right leg below knee: Secondary | ICD-10-CM | POA: Diagnosis not present

## 2018-09-27 DIAGNOSIS — E1142 Type 2 diabetes mellitus with diabetic polyneuropathy: Secondary | ICD-10-CM | POA: Diagnosis not present

## 2018-09-27 DIAGNOSIS — M79675 Pain in left toe(s): Secondary | ICD-10-CM | POA: Diagnosis not present

## 2018-09-27 NOTE — Telephone Encounter (Signed)
[name] from [facility] called wanting to know if Dr. Sharol [name] would willing to sign a referral for personal care so that they can get her transportation services for the patient.  CB#[phone].  Thank you.

## 2018-09-27 NOTE — Telephone Encounter (Signed)
Patient called today and stated she received 2 bills for no-show appointments that she did not make and wants to know how that could happen because she was in the hospital having leg amputated and did not make the appointments- I looked into this and was able to see that one of the appointments that was made was not made on the date of visit so the patient must have been called to make this- I cancelled her next appointment for today per patient request due to she is still healing- patient would like a call back on this matter from the office manager as soon as possible

## 2018-09-27 NOTE — Telephone Encounter (Signed)
Please call.

## 2018-09-27 NOTE — Telephone Encounter (Signed)
Returned called and spoke to [place]  At [facility] will be sending over fax for referral for patient for Dr Sharol Given to sign and return.    FYI:  Her Fax # is [phone]

## 2018-10-03 NOTE — Telephone Encounter (Signed)
I called and lm on vm for amanda to advise that I have received the paperwork fro this pt but the sections that they are asking to be completed have already been completed by dr. Rex Kras. I am not sure what they are wanting Dr. Sharol Given to do. I asked for them to give a call back and explain what it is they are needing Dr. Sharol Given to do on a form that has been completed by another doctor.

## 2018-10-08 ENCOUNTER — Ambulatory Visit (INDEPENDENT_AMBULATORY_CARE_PROVIDER_SITE_OTHER): Payer: Medicare Other | Admitting: Orthopedic Surgery

## 2018-10-08 ENCOUNTER — Encounter (INDEPENDENT_AMBULATORY_CARE_PROVIDER_SITE_OTHER): Payer: Self-pay | Admitting: Orthopedic Surgery

## 2018-10-08 VITALS — Ht 65.0 in | Wt 179.0 lb

## 2018-10-08 DIAGNOSIS — Z89511 Acquired absence of right leg below knee: Secondary | ICD-10-CM | POA: Diagnosis not present

## 2018-10-09 ENCOUNTER — Ambulatory Visit (INDEPENDENT_AMBULATORY_CARE_PROVIDER_SITE_OTHER): Payer: Medicare Other | Admitting: Sports Medicine

## 2018-10-09 ENCOUNTER — Other Ambulatory Visit (INDEPENDENT_AMBULATORY_CARE_PROVIDER_SITE_OTHER): Payer: Self-pay | Admitting: Orthopedic Surgery

## 2018-10-09 ENCOUNTER — Encounter (INDEPENDENT_AMBULATORY_CARE_PROVIDER_SITE_OTHER): Payer: Self-pay | Admitting: Orthopedic Surgery

## 2018-10-09 ENCOUNTER — Encounter: Payer: Self-pay | Admitting: Sports Medicine

## 2018-10-09 DIAGNOSIS — B351 Tinea unguium: Secondary | ICD-10-CM

## 2018-10-09 DIAGNOSIS — M79675 Pain in left toe(s): Secondary | ICD-10-CM

## 2018-10-09 DIAGNOSIS — Z89511 Acquired absence of right leg below knee: Secondary | ICD-10-CM

## 2018-10-09 DIAGNOSIS — E1142 Type 2 diabetes mellitus with diabetic polyneuropathy: Secondary | ICD-10-CM | POA: Diagnosis not present

## 2018-10-09 DIAGNOSIS — G2581 Restless legs syndrome: Secondary | ICD-10-CM

## 2018-10-09 MED ORDER — ROPINIROLE HCL 5 MG PO TABS: 5.0000 mg | ORAL_TABLET | Freq: Three times a day (TID) | ORAL | 1 refills | Status: DC

## 2018-10-09 NOTE — Telephone Encounter (Signed)
Should not need further antibiotic

## 2018-10-09 NOTE — Telephone Encounter (Signed)
You saw this pt yesterday do you want to continue with Doxy rx for her?

## 2018-10-09 NOTE — Progress Notes (Signed)
Office Visit Note   Patient: [name]           Date of Birth: 1966-01-05           MRN: [id number] Visit Date: 10/08/2018              Requested by: Tamsen Roers, [place], [place] [address] PCP: Tamsen Roers, MD  Chief Complaint  Patient presents with  . Right Leg - Follow-up    03/2018 BKA       HPI: Patient is a 52 year old woman who presents in follow-up for right transtibial amputation.  Patient states that she has difficulty weightbearing on her prosthetic limb she uses a walker she complains of pain she states around the house she crawls and does not use her prosthesis.  Patient is still smoking.  Assessment & Plan: Visit Diagnoses:  1. Acquired absence of right leg below knee (HCC)     Plan: Recommend following up with [employer]otech to have the medial and lateral aspects of the anterior aspect of the socket to try to decrease subsidence.  Follow-Up Instructions: Return in about 4 weeks (around 11/05/2018).   Ortho Exam  Patient is alert, oriented, no adenopathy, well-dressed, normal affect, normal respiratory effort. Examination patient does not fully weight-bear her leg subsides into the socket with attempted weightbearing.  She has callus on the patella tibial tubercle bilaterally this appears to be more from swelling than from her socket.  The socket is currently 2 months old.  She has a ulcer medially over the residual limb which is 5 mm in diameter there is no abscess.  This appears to be from her limbs subsiding into the socket.  Imaging: No results found. No images are attached to the encounter.  Labs: Lab Results  Component Value Date   HGBA1C 10.6 (H) 07/03/2018   HGBA1C 9.2 03/19/2018   HGBA1C 10.0 12/11/2017   ESRSEDRATE 55 (H) 02/19/2016   CRP 17.4 (H) 02/19/2016   LABURIC 5.8 02/19/2016   REPTSTATUS 07/04/2018 FINAL 07/03/2018   GRAMSTAIN  04/11/2018    RARE WBC PRESENT, PREDOMINANTLY PMN NO ORGANISMS SEEN    CULT  07/03/2018    NO GROWTH Performed at [facility], [place] [address], [place], [place] [address]    LABORGA ESCHERICHIA COLI (A) 05/24/2018     Lab Results  Component Value Date   ALBUMIN 3.3 (L) 07/03/2018   ALBUMIN 3.3 (L) 06/28/2018   ALBUMIN 3.8 05/22/2018   LABURIC 5.8 02/19/2016    Body mass index is 29.79 kg/m.  Orders:  No orders of the defined types were placed in this encounter.  No orders of the defined types were placed in this encounter.    Procedures: No procedures performed  Clinical Data: No additional findings.  ROS:  All other systems negative, except as noted in the HPI. Review of Systems  Objective: Vital Signs: Ht 5\' 5"  (1.651 m)   Wt 179 lb (81.2 kg)   BMI 29.79 kg/m   Specialty Comments:  No specialty comments available.  PMFS History: Patient Active Problem List   Diagnosis Date Noted  . Cellulitis of left upper extremity   . Poorly controlled diabetes mellitus ([place])   . UTI (urinary tract infection) 05/23/2018  . Acute metabolic encephalopathy 76/72/0947  . Toxic encephalopathy 05/09/2018  . GERD (gastroesophageal reflux disease) 05/09/2018  . Tobacco abuse 05/09/2018  . Polypharmacy 05/09/2018  . Restless leg syndrome 04/27/2018  . Anxiety 04/27/2018  .  Hyponatremia 04/26/2018  . Noncompliance with dietary restriction 04/26/2018  . Postoperative cellulitis of surgical wound 04/26/2018  . At high risk for falls 04/24/2018  . Noncompliance with safety precautions 04/24/2018  . Leukocytosis 04/24/2018  . Unilateral complete BKA ([place]) 04/16/2018  . PAD (peripheral artery disease) ([place])   . Poorly controlled type 2 diabetes mellitus ([place])   . Subacute osteomyelitis, right ankle and foot ([place])   . Major depressive disorder, recurrent episode ([place]) 04/11/2018  . Diabetic ulcer of right midfoot associated with type 2 diabetes mellitus, with necrosis of bone ([place])   . Abscess of ankle   . Infectious synovitis   . Diabetic foot infection  ([place])   . Sepsis ([place]) 04/09/2018  . Cancer of sigmoid colon ([place]) 03/09/2015  . OSA (obstructive sleep apnea) 07/04/2014  . Migraine without aura, with intractable migraine, so stated, without mention of status migrainosus 03/26/2014  . Peripheral neuropathy 03/03/2014  . Abnormal stress test 02/12/2014  . CVA (cerebral infarction) 02/12/2014  . Chest pain   . Abnormal heart rhythm   . Edema   . Hypertension   . Hyperlipemia   . Hypercholesterolemia   . Other and unspecified hyperlipidemia 11/14/2013  . Type II diabetes mellitus, uncontrolled ([place]) 07/22/2013  . Essential hypertension 07/22/2013  . Diabetic neuropathy with neurologic complication ([place]) 29/47/6546  . Diabetic polyneuropathy ([place]) 07/22/2013   Past Medical History:  Diagnosis Date  . Anxiety   . Arthritis   . Bipolar disorder ([place])   . Broken jaw ([place])   . Broken wrist   . Chronic back pain   . Claudication ([place])    a. 12/2013 ABI's: R 0.97, L 0.94.  [name] COPD (chronic obstructive pulmonary disease) ([place])   . Depression   . Diabetic Charcot's foot ([place])   . Diabetic foot ulcer (HCC)    chronic left foot ulcer , great toe/  hx recurrent foot ulcer bilaterally  . DJD (degenerative joint disease)   . Edema of both lower extremities   . Episode of memory loss   . GERD (gastroesophageal reflux disease)   . Headache(784.0)   . Hiatal hernia   . History of colon cancer, stage I dx 02-26-2015--- oncologist-- dr [name]--- per last note no recurrence   05-16-2015 s/p  Laparoscopic sigmoid colectomy w/ node bx's (negative nodes per path)  Stage I (pT1,N0,M0) Grade 2  . 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   per MRI multiple small strokes post cardiac cath. --  residual mild memory loss  . History of methicillin resistant staphylococcus aureus (MRSA)   . Hypercholesterolemia   . Insomnia   . Insulin dependent type 2 diabetes mellitus, uncontrolled ([place]) dx 2004   endocrinologist-  dr  Dwyane Dee-- last A1c 8.8 in Aug2018:  pt is noncompliant w/ diet, states does note eat breakfast , her first main meal in afternoon  . Neurogenic bladder   . Neuropathy, diabetic ([place])    hands and feet  . OSA (obstructive sleep apnea)    cpap intolerant  . Personality disorder ([place])   . Restless leg syndrome   . SOB (shortness of breath) on exertion   . Stroke ([place])   . SUI (stress urinary incontinence, female) S/P SLING 12-29-2011    Family History  Problem Relation Age of Onset  . Hypertension Mother   . Diabetes Mother   . Cancer - Other Mother        lymphoma   . Cancer - Other  Father        lung, bladder cancer   . Heart attack Father   . Cancer - Other Brother        bladder cancer     Past Surgical History:  Procedure Laterality Date  . AMPUTATION Right 04/13/2018   Procedure: RIGHT BELOW KNEE AMPUTATION;  Surgeon: Newt Minion, MD;  Location: [place];  Service: Orthopedics;  Laterality: Right;  . ANTERIOR CERVICAL DECOMP/DISCECTOMY FUSION  2000   C5 - 7  . BACK SURGERY    . CARDIAC CATHETERIZATION  05-22-2008   DR SKAINS   NO SIGNIFECANT CAD/ NORMAL LV/  EF 65%/  NO WALL MOTION ABNORMALITIES  . CARPAL TUNNEL RELEASE Right 04-25-2013  . [place]; 1992  . COLON SURGERY    . COLONOSCOPY    . CYSTO N/A 04/30/2013   Procedure: CYSTOSCOPY;  Surgeon: Reece Packer, MD;  [facility];  Service: Urology;  Laterality: N/A;  . CYSTOSCOPY MACROPLASTIQUE IMPLANT N/A 02/06/2018   Procedure: CYSTOSCOPY MACROPLASTIQUE IMPLANT;  Surgeon: Bjorn Loser, MD;  Location: [facility];  Service: Urology;  Laterality: N/A;  . CYSTOSCOPY WITH INJECTION  05/04/2012   Procedure: CYSTOSCOPY WITH INJECTION;  Surgeon: Reece Packer, MD;  Location: [place];  Service: Urology;  Laterality: N/A;  MACROPLASTIQUE INJECTION  . CYSTOSCOPY WITH INJECTION  08/28/2012   Procedure: CYSTOSCOPY WITH INJECTION;  Surgeon: Reece Packer, MD;  Location:  [facility];  Service: Urology;  Laterality: N/A;  cysto and macroplastique   . FOOT SURGERY Bilateral    "related to [place]"  . HERNIA REPAIR  ?1996   "stomach"  . INCISION AND DRAINAGE OF WOUND Right 04/11/2018   Procedure: IRRIGATION AND DEBRIDEMENT WOUND right foot and right ankle;  Surgeon: Evelina Bucy, DPM;  [facility];  Service: Podiatry;  Laterality: Right;  . KNEE ARTHROSCOPY Left   . KNEE ARTHROSCOPY W/ ALLOGRAFT IMPANT Left    graft x 2  . KNEE SURGERY     TOTAL 8 SURG'S  . LAPAROSCOPIC SIGMOID COLECTOMY N/A 04/22/2015   Procedure: LAPAROSCOPIC HAND ASSISTED SIGMOID COLECTOMY;  Surgeon: Erroll Luna, MD;  Location: [place];  Service: General;  Laterality: N/A;  . LEFT HEART CATHETERIZATION WITH CORONARY ANGIOGRAM N/A 02/12/2014   Procedure: LEFT HEART CATHETERIZATION WITH CORONARY ANGIOGRAM;  Surgeon: Candee Furbish, MD;  Location: [facility];  Service: Cardiovascular;  Laterality: N/A;   No angiographically significant CAD; normal LVSF, LVEDP 60mmHg,  EF 55% (new finding ef 30% myoview 01-08-2014)  . LUMBAR FUSION    . MANDIBLE FRACTURE SURGERY    . MULTIPLE LAPAROSCOPIES FOR ENDOMETRIOSIS    . PUBOVAGINAL SLING  12/29/2011   Procedure: Gaynelle Arabian;  Surgeon: Reece Packer, MD;  Location: [facility];  Service: Urology;  Laterality: N/A;  cysto and sparc sling   . PUBOVAGINAL SLING N/A 04/30/2013   Procedure: REMOVAL OF VAGINAL MESH;  Surgeon: Reece Packer, MD;  [facility];  Service: Urology;  Laterality: N/A;  . RECONSTURCTION OF CONGENITAL UTERUS ANOMALY  1983  . REPEAT RECONSTRUCTION ACL LEFT KNEE/ SCREWS REMOVED  03-28-2000   CADAVER GRAFT  . TOTAL ABDOMINAL HYSTERECTOMY  1997   w/BSO  . TRANSTHORACIC ECHOCARDIOGRAM  02/13/2014   ef 45%, hypokinesis base inferior and base inferolateral walls  . UPPER GI ENDOSCOPY    . WOUND DEBRIDEMENT Right 09/05/2016   Procedure: DEBRIDEMENT WOUND WITH GRAFT RIGHT FOOT;  Surgeon:  Landis Martins, DPM;  Location: [place];  Service: Podiatry;  Laterality: Right;  . WRIST FRACTURE SURGERY     Social History   Occupational History  . Not on file  Tobacco Use  . Smoking status: Current Every Day Smoker    Packs/day: 1.50    Years: 48.00    Pack years: 72.00    Types: Cigarettes  . Smokeless tobacco: Never Used  . Tobacco comment: per pt started smoking  age 30  Substance and Sexual Activity  . Alcohol use: No    Comment: 05/24/2018 "special occasions only"  . Drug use: Not Currently  . Sexual activity: Yes

## 2018-10-09 NOTE — Progress Notes (Signed)
Subjective: [name] is a 52 y.o. female patient with history of diabetes who comes to office for f/u eval on left after BKA on right.  Reports that she needs to have her nails trimmed and states that she has a [employer] with [employer] for them to adjust her right prosthetic because she is getting pain and rubbing.  Patient is assisted but not at this visit and states that her aunt helps her a lot which she is thankful for.  Patient also states that she is wondering if I can go up on her medicine for restless leg syndrome because her current dose seems like it is not helping and reports that her blood sugar was not recorded but was around 230 when recorded by home nurse and has ranged from this number to about 280.  Patient denies any other acute problems or issues at this time.  Patient Active Problem List   Diagnosis Date Noted  . Cellulitis of left upper extremity   . Poorly controlled diabetes mellitus ([place])   . UTI (urinary tract infection) 05/23/2018  . Acute metabolic encephalopathy 12/45/8099  . Toxic encephalopathy 05/09/2018  . GERD (gastroesophageal reflux disease) 05/09/2018  . Tobacco abuse 05/09/2018  . Polypharmacy 05/09/2018  . Restless leg syndrome 04/27/2018  . Anxiety 04/27/2018  . Hyponatremia 04/26/2018  . Noncompliance with dietary restriction 04/26/2018  . Postoperative cellulitis of surgical wound 04/26/2018  . At high risk for falls 04/24/2018  . Noncompliance with safety precautions 04/24/2018  . Leukocytosis 04/24/2018  . 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 PAD (peripheral artery disease) ([place])   . Poorly controlled type 2 diabetes mellitus ([place])   . Subacute osteomyelitis, right ankle and foot ([place])   . Major depressive disorder, recurrent episode ([place]) 04/11/2018  . Diabetic ulcer of right midfoot associated with type 2 diabetes mellitus, with necrosis of bone ([place])   . Abscess of ankle   . Infectious synovitis   . Diabetic foot infection  ([place])   . Sepsis ([place]) 04/09/2018  . Cancer of sigmoid colon ([place]) 03/09/2015  . OSA (obstructive sleep apnea) 07/04/2014  . Migraine without aura, with intractable migraine, so stated, without mention of status migrainosus 03/26/2014  . Peripheral neuropathy 03/03/2014  . Abnormal stress test 02/12/2014  . CVA (cerebral infarction) 02/12/2014  . Chest pain   . Abnormal heart rhythm   . Edema   . Hypertension   . Hyperlipemia   . Hypercholesterolemia   . Other and unspecified hyperlipidemia 11/14/2013  . Type II diabetes mellitus, uncontrolled ([place]) 07/22/2013  . Essential hypertension 07/22/2013  . Diabetic neuropathy with neurologic complication ([place]) 83/38/2505  . Diabetic polyneuropathy ([place]) 07/22/2013   Current Outpatient Medications on File Prior to Visit  Medication Sig Dispense Refill  . amoxicillin-clavulanate (AUGMENTIN) 875-125 MG tablet amoxicillin 875 mg-potassium clavulanate 125 mg tablet    . Armodafinil 250 MG tablet armodafinil 250 mg tablet    . aspirin-acetaminophen-caffeine (EXCEDRIN MIGRAINE) 250-250-65 MG tablet Take 2 tablets by mouth every 6 (six) hours as needed for headache.    . bisacodyl (DULCOLAX) 5 MG EC tablet Take 1 tablet (5 mg total) by mouth daily as needed for moderate constipation. 30 tablet 0  . clonazePAM (KLONOPIN) 1 MG tablet Take 1 mg by mouth 3 (three) times daily as needed for anxiety or agitation.  1  . cyclobenzaprine (FLEXERIL) 10 MG tablet Take 10 mg by mouth 3 (three) times daily as needed for muscle spasms.     [name]  diazepam (VALIUM) 5 MG tablet     . doxycycline (VIBRA-TABS) 100 MG tablet Take 1 tablet (100 mg total) by mouth 2 (two) times daily. 60 tablet 0  . empagliflozin (JARDIANCE) 25 MG TABS tablet Take 25 mg by mouth daily.    [name] esomeprazole (NEXIUM) 40 MG capsule     . gabapentin (NEURONTIN) 600 MG tablet Take 1 tablet (600 mg total) by mouth 2 (two) times daily. Take in the morning and afternoon. Do not take at night time.     [name] glucose blood (FREESTYLE TEST STRIPS) test strip USE TO CHECK BLOOD SUGARS DAILY. DX CODE E11.65 50 each 3  . HETLIOZ 20 MG CAPS Take 20 mg by mouth at bedtime.    . INGREZZA 80 MG CAPS Take 80 mg by mouth at bedtime.    . insulin aspart (NOVOLOG) 100 UNIT/ML injection Inject 6 Units into the skin 3 (three) times daily with meals. Take extra dose according to the chart provided. 10 mL 11  . Insulin Glargine (BASAGLAR KWIKPEN) 100 UNIT/ML SOPN Inject 0.22 mLs (22 Units total) into the skin daily. 15 mL 3  . lidocaine (LIDODERM) 5 % Place 1 patch onto the skin daily as needed (pain). Remove & Discard patch within 12 hours or as directed by MD 30 patch 2  . Melatonin 10 MG CAPS melatonin 10 mg capsule  ONE BY MOUTH AT BEDTIME INSTEAD OF AMBIEN    . mineral oil-hydrophilic petrolatum (AQUAPHOR) ointment Apply topically as needed for dry skin. 420 g 0  . nortriptyline (PAMELOR) 10 MG capsule Take 10 mg by mouth at bedtime.    . ondansetron (ZOFRAN) 4 MG tablet Take 1 tablet (4 mg total) by mouth every 6 (six) hours as needed for nausea. 20 tablet 0  . Oxycodone HCl 10 MG TABS Take 10 mg by mouth every 6 (six) hours as needed for severe pain.    [name] oxyCODONE-acetaminophen (PERCOCET) 10-325 MG tablet Take 1 tablet by mouth every 8 (eight) hours as needed for pain. 20 tablet 0  . oxyCODONE-acetaminophen (PERCOCET) 10-325 MG tablet Take 1 tablet by mouth every 4 (four) hours as needed for pain. 21 tablet 0  . pantoprazole (PROTONIX) 40 MG tablet Take 1 tablet (40 mg total) by mouth daily. 30 tablet 0  . QUEtiapine (SEROQUEL) 300 MG tablet Take 600 mg by mouth at bedtime.  1  . silver sulfADIAZINE (SILVADENE) 1 % cream Apply 1 application topically as needed.  0  . sulfamethoxazole-trimethoprim (BACTRIM DS,SEPTRA DS) 800-160 MG tablet sulfamethoxazole 800 mg-trimethoprim 160 mg tablet    . tamsulosin (FLOMAX) 0.4 MG CAPS capsule Take 0.4 mg by mouth daily.    [name] VRAYLAR 4.5 MG CAPS Take 4.5 mg by mouth at  bedtime.  1   No current facility-administered medications on file prior to visit.    Allergies  Allergen Reactions  . Latex Itching, Rash and Other (See Comments)    Pt states she cannot use condoms - cause an infection.  Use of latex on skin is okay.  Tape causes rash  . Sweetening Enhancer [Flavoring Agent] Nausea And Vomiting and Other (See Comments)    HEADACHES  . Aspartame And Phenylalanine Nausea And Vomiting    HEADACHES  . Ibuprofen Other (See Comments)    HEADACHES  . Trazodone And Nefazodone Other (See Comments)    Hallucinations   . Triazolam Other (See Comments)    HALLUCINATIONS    Objective: General: Patient is awake, alert, and oriented x  3 and in no acute distress.  Integument: Skin is warm, dry and supple. Nails are tender, long, thickened and  dystrophic with subungual debris, consistent with onychomycosis, 1-5 on left. No signs of infection. No open lesions or preulcerative lesions present. Remaining integument unremarkable.  Vasculature:  Dorsalis Pedis pulse 1/4 left. Posterior Tibial pulse 1/4 left.  Capillary fill time <5 sec 1-5 on left  Neurology: Absent protective sensation on left  Musculoskeletal:s/p R BKA. Hammertoe deformity on left.  Subjective restless legs left greater than right amputation stump site.  Assessment and Plan: Problem List Items Addressed This Visit      Endocrine   Diabetic polyneuropathy ([place])   Relevant Medications   Armodafinil 250 MG tablet   diazepam (VALIUM) 5 MG tablet   ropinirole (REQUIP) 5 MG tablet     Other   Restless leg syndrome    Other Visit Diagnoses    Pain due to onychomycosis of toenail of left foot    -  Primary   Relevant Medications   sulfamethoxazole-trimethoprim (BACTRIM DS,SEPTRA DS) 800-160 MG tablet   Status post below knee amputation, right ([place])         -Examined patient. -Discussed and educated patient on diabetic foot care, especially with  regards to the vascular, neurological  and musculoskeletal systems.  -Mechanically debrided all nails 1-5 on left -Prescription written for patient to get custom shoe for the left from [employer] since she has to go today for her prosthetic on the right to be adjusted -Change Requip for restless leg to 5 mg 3 times daily increasing this from the 4 mg 3 times daily dose that she was originally on to see if this will add some additional relief from her restless leg symptoms of which I advised patient that this may be slightly worsened due to her increased nerve sensitivity after her below-knee amputation -Return to office in 3 months for continued left foot nail care -Patient advised to call the office if any problems or questions arise in the meantime.  Landis Martins, DPM

## 2018-10-10 ENCOUNTER — Ambulatory Visit: Payer: Self-pay | Admitting: Family Medicine

## 2018-10-11 LAB — HM DIABETES EYE EXAM

## 2018-10-16 ENCOUNTER — Telehealth: Payer: Self-pay

## 2018-10-16 ENCOUNTER — Other Ambulatory Visit: Payer: Self-pay

## 2018-10-16 MED ORDER — INSULIN ASPART 100 UNIT/ML FLEXPEN: PEN_INJECTOR | SUBCUTANEOUS | 3 refills | Status: DC

## 2018-10-16 NOTE — Telephone Encounter (Signed)
novolog pens sent to [facility] per patient request

## 2018-10-18 NOTE — Telephone Encounter (Signed)
Request to remove the NS fee for 07/17/18 has been placed. This is the last time she will have it removed.

## 2018-10-22 ENCOUNTER — Other Ambulatory Visit: Payer: Self-pay | Admitting: Endocrinology

## 2018-10-22 NOTE — Telephone Encounter (Signed)
Last OV-03/19/18 Last refill 04/30/18  Rx Jardiance 25 mg request--please advise

## 2018-10-23 NOTE — Telephone Encounter (Signed)
Notified pt Rx sent to pharmacy and need F/U appt.

## 2018-10-24 HISTORY — PX: CATARACT EXTRACTION: SUR2

## 2018-10-25 ENCOUNTER — Other Ambulatory Visit: Payer: Self-pay

## 2018-10-25 ENCOUNTER — Emergency Department (HOSPITAL_COMMUNITY): Payer: Medicare Other

## 2018-10-25 ENCOUNTER — Emergency Department (HOSPITAL_COMMUNITY): Payer: Medicare Other | Admitting: Certified Registered Nurse Anesthetist

## 2018-10-25 ENCOUNTER — Encounter (HOSPITAL_COMMUNITY)
Admission: EM | Disposition: A | Discharge status: Skilled Nursing Facility | Payer: Self-pay | Source: Home / Self Care | Attending: Orthopedic Surgery

## 2018-10-25 ENCOUNTER — Encounter (HOSPITAL_COMMUNITY): Payer: Self-pay | Admitting: Emergency Medicine

## 2018-10-25 ENCOUNTER — Inpatient Hospital Stay (HOSPITAL_COMMUNITY)
Admission: EM | Admit: 2018-10-25 | Discharge: 2018-10-31 | DRG: 908 | Disposition: A | Discharge status: Skilled Nursing Facility | Payer: Medicare Other | Attending: Orthopedic Surgery | Admitting: Orthopedic Surgery

## 2018-10-25 DIAGNOSIS — Z8673 Personal history of transient ischemic attack (TIA), and cerebral infarction without residual deficits: Secondary | ICD-10-CM

## 2018-10-25 DIAGNOSIS — E1165 Type 2 diabetes mellitus with hyperglycemia: Secondary | ICD-10-CM | POA: Diagnosis present

## 2018-10-25 DIAGNOSIS — F17210 Nicotine dependence, cigarettes, uncomplicated: Secondary | ICD-10-CM | POA: Diagnosis present

## 2018-10-25 DIAGNOSIS — Z8249 Family history of ischemic heart disease and other diseases of the circulatory system: Secondary | ICD-10-CM

## 2018-10-25 DIAGNOSIS — S91359S Open bite, unspecified foot, sequela: Secondary | ICD-10-CM

## 2018-10-25 DIAGNOSIS — Z8614 Personal history of Methicillin resistant Staphylococcus aureus infection: Secondary | ICD-10-CM | POA: Diagnosis not present

## 2018-10-25 DIAGNOSIS — J449 Chronic obstructive pulmonary disease, unspecified: Secondary | ICD-10-CM | POA: Diagnosis present

## 2018-10-25 DIAGNOSIS — E7800 Pure hypercholesterolemia, unspecified: Secondary | ICD-10-CM | POA: Diagnosis present

## 2018-10-25 DIAGNOSIS — K219 Gastro-esophageal reflux disease without esophagitis: Secondary | ICD-10-CM | POA: Diagnosis present

## 2018-10-25 DIAGNOSIS — I10 Essential (primary) hypertension: Secondary | ICD-10-CM | POA: Diagnosis present

## 2018-10-25 DIAGNOSIS — S98122A Partial traumatic amputation of left great toe, initial encounter: Principal | ICD-10-CM | POA: Diagnosis present

## 2018-10-25 DIAGNOSIS — L089 Local infection of the skin and subcutaneous tissue, unspecified: Secondary | ICD-10-CM | POA: Diagnosis not present

## 2018-10-25 DIAGNOSIS — E1140 Type 2 diabetes mellitus with diabetic neuropathy, unspecified: Secondary | ICD-10-CM | POA: Diagnosis present

## 2018-10-25 DIAGNOSIS — Z807 Family history of other malignant neoplasms of lymphoid, hematopoietic and related tissues: Secondary | ICD-10-CM | POA: Diagnosis not present

## 2018-10-25 DIAGNOSIS — Z833 Family history of diabetes mellitus: Secondary | ICD-10-CM

## 2018-10-25 DIAGNOSIS — Z8052 Family history of malignant neoplasm of bladder: Secondary | ICD-10-CM

## 2018-10-25 DIAGNOSIS — Z89511 Acquired absence of right leg below knee: Secondary | ICD-10-CM | POA: Diagnosis not present

## 2018-10-25 DIAGNOSIS — W540XXS Bitten by dog, sequela: Secondary | ICD-10-CM

## 2018-10-25 DIAGNOSIS — E441 Mild protein-calorie malnutrition: Secondary | ICD-10-CM | POA: Diagnosis present

## 2018-10-25 DIAGNOSIS — E11610 Type 2 diabetes mellitus with diabetic neuropathic arthropathy: Secondary | ICD-10-CM | POA: Diagnosis present

## 2018-10-25 DIAGNOSIS — W540XXA Bitten by dog, initial encounter: Secondary | ICD-10-CM | POA: Diagnosis not present

## 2018-10-25 DIAGNOSIS — Z91040 Latex allergy status: Secondary | ICD-10-CM

## 2018-10-25 DIAGNOSIS — Z89412 Acquired absence of left great toe: Secondary | ICD-10-CM

## 2018-10-25 DIAGNOSIS — Z9111 Patient's noncompliance with dietary regimen: Secondary | ICD-10-CM

## 2018-10-25 DIAGNOSIS — Z85038 Personal history of other malignant neoplasm of large intestine: Secondary | ICD-10-CM

## 2018-10-25 DIAGNOSIS — L02612 Cutaneous abscess of left foot: Secondary | ICD-10-CM | POA: Diagnosis present

## 2018-10-25 DIAGNOSIS — Y92009 Unspecified place in unspecified non-institutional (private) residence as the place of occurrence of the external cause: Secondary | ICD-10-CM

## 2018-10-25 HISTORY — PX: INCISION AND DRAINAGE: SHX5863

## 2018-10-25 HISTORY — PX: I & D EXTREMITY: SHX5045

## 2018-10-25 HISTORY — PX: I&D EXTREMITY: SHX5045

## 2018-10-25 LAB — BASIC METABOLIC PANEL
Anion gap: 9 (ref 5–15)
BUN: 5 mg/dL — AB (ref 6–20)
CALCIUM: 9 mg/dL (ref 8.9–10.3)
CHLORIDE: 108 mmol/L (ref 98–111)
CO2: 21 mmol/L — AB (ref 22–32)
CREATININE: 0.68 mg/dL (ref 0.44–1.00)
GFR calc Af Amer: >60 mL/min (ref >60)
GFR calc non Af Amer: >60 mL/min (ref >60)
GLUCOSE: 381 mg/dL — AB (ref 70–99)
Potassium: 3.3 mmol/L — ABNORMAL LOW (ref 3.5–5.1)
Sodium: 138 mmol/L (ref 135–145)

## 2018-10-25 LAB — CBC WITH DIFFERENTIAL/PLATELET
Abs Immature Granulocytes: 0.1 10*3/uL — ABNORMAL HIGH (ref 0.00–0.07)
Basophils Absolute: 0.1 10*3/uL (ref 0.0–0.1)
Basophils Relative: 0 %
EOS ABS: 0.2 10*3/uL (ref 0.0–0.5)
EOS PCT: 1 %
HCT: 42.2 % (ref 36.0–46.0)
Hemoglobin: 13.4 g/dL (ref 12.0–15.0)
IMMATURE GRANULOCYTES: 1 %
Lymphocytes Relative: 13 %
Lymphs Abs: 2.4 10*3/uL (ref 0.7–4.0)
MCH: 29.3 pg (ref 26.0–34.0)
MCHC: 31.8 g/dL (ref 30.0–36.0)
MCV: 92.3 fL (ref 80.0–100.0)
Monocytes Absolute: 0.7 10*3/uL (ref 0.1–1.0)
Monocytes Relative: 4 %
NEUTROS PCT: 81 %
Neutro Abs: 14.4 10*3/uL — ABNORMAL HIGH (ref 1.7–7.7)
Platelets: 246 10*3/uL (ref 150–400)
RBC: 4.57 MIL/uL (ref 3.87–5.11)
RDW: 12.8 % (ref 11.5–15.5)
WBC: 17.8 10*3/uL — ABNORMAL HIGH (ref 4.0–10.5)
nRBC: 0 % (ref 0.0–0.2)

## 2018-10-25 LAB — GLUCOSE, CAPILLARY
Glucose-Capillary: 198 mg/dL — ABNORMAL HIGH (ref 70–99)
Glucose-Capillary: 163 mg/dL — ABNORMAL HIGH (ref 70–99)
Glucose-Capillary: 249 mg/dL — ABNORMAL HIGH (ref 70–99)

## 2018-10-25 LAB — CBG MONITORING, ED: Glucose-Capillary: 337 mg/dL — ABNORMAL HIGH (ref 70–99)

## 2018-10-25 SURGERY — IRRIGATION AND DEBRIDEMENT EXTREMITY
Anesthesia: General | Site: Toe | Laterality: Left

## 2018-10-25 MED ORDER — SODIUM CHLORIDE 0.9 % IR SOLN
Status: DC | PRN
  Administered 2018-10-25 (×3): 1000 mL

## 2018-10-25 MED ORDER — FENTANYL CITRATE (PF) 250 MCG/5ML IJ SOLN
INTRAMUSCULAR | Status: AC
  Filled 2018-10-25: qty 5

## 2018-10-25 MED ORDER — ONDANSETRON HCL 4 MG/2ML IJ SOLN: INTRAMUSCULAR | Status: DC | PRN

## 2018-10-25 MED ORDER — TASIMELTEON 20 MG PO CAPS: 20.0000 mg | ORAL_CAPSULE | Freq: Every day | ORAL | Status: DC

## 2018-10-25 MED ORDER — HYDROMORPHONE HCL 1 MG/ML IJ SOLN
0.5000 mg | INTRAMUSCULAR | Status: AC
  Administered 2018-10-25: 0.5 mg via INTRAVENOUS

## 2018-10-25 MED ORDER — METOCLOPRAMIDE HCL 5 MG/ML IJ SOLN: 5.0000 mg | Freq: Three times a day (TID) | INTRAMUSCULAR | Status: DC | PRN

## 2018-10-25 MED ORDER — LACTATED RINGERS IV SOLN
INTRAVENOUS | Status: DC
  Administered 2018-10-25: 15:00:00 via INTRAVENOUS

## 2018-10-25 MED ORDER — LIDOCAINE HCL (CARDIAC) PF 100 MG/5ML IV SOSY
PREFILLED_SYRINGE | INTRAVENOUS | Status: DC | PRN
  Administered 2018-10-25: 30 mg via INTRAVENOUS

## 2018-10-25 MED ORDER — VALBENAZINE TOSYLATE 80 MG PO CAPS: 80.0000 mg | ORAL_CAPSULE | Freq: Every day | ORAL | Status: DC

## 2018-10-25 MED ORDER — HYDROMORPHONE HCL 1 MG/ML IJ SOLN
0.2500 mg | INTRAMUSCULAR | Status: DC | PRN
  Administered 2018-10-25: 0.5 mg via INTRAVENOUS

## 2018-10-25 MED ORDER — QUETIAPINE FUMARATE 100 MG PO TABS
600.0000 mg | ORAL_TABLET | Freq: Every day | ORAL | Status: DC
  Administered 2018-10-25 – 2018-10-30 (×6): 600 mg via ORAL
  Filled 2018-10-25 (×6): qty 6

## 2018-10-25 MED ORDER — ONDANSETRON HCL 4 MG/2ML IJ SOLN: 4.0000 mg | Freq: Once | INTRAMUSCULAR | Status: DC | PRN

## 2018-10-25 MED ORDER — CANAGLIFLOZIN 100 MG PO TABS
100.0000 mg | ORAL_TABLET | Freq: Every day | ORAL | Status: DC
  Administered 2018-10-27 – 2018-10-31 (×5): 100 mg via ORAL
  Filled 2018-10-25 (×6): qty 1

## 2018-10-25 MED ORDER — MEPERIDINE HCL 50 MG/ML IJ SOLN: 6.2500 mg | INTRAMUSCULAR | Status: DC | PRN

## 2018-10-25 MED ORDER — CARIPRAZINE HCL 6 MG PO CAPS: 6.0000 mg | ORAL_CAPSULE | Freq: Every day | ORAL | Status: DC

## 2018-10-25 MED ORDER — INSULIN GLARGINE 100 UNIT/ML ~~LOC~~ SOLN
22.0000 [IU] | Freq: Every day | SUBCUTANEOUS | Status: DC
  Administered 2018-10-25 – 2018-10-31 (×7): 22 [IU] via SUBCUTANEOUS
  Filled 2018-10-25 (×8): qty 0.22

## 2018-10-25 MED ORDER — OXYCODONE HCL 5 MG PO TABS
5.0000 mg | ORAL_TABLET | ORAL | Status: DC | PRN
  Administered 2018-10-25 – 2018-10-26 (×2): 10 mg via ORAL
  Filled 2018-10-25 (×3): qty 2

## 2018-10-25 MED ORDER — MORPHINE SULFATE (PF) 4 MG/ML IV SOLN
4.0000 mg | Freq: Once | INTRAVENOUS | Status: AC
  Administered 2018-10-25: 4 mg via INTRAVENOUS
  Filled 2018-10-25: qty 1

## 2018-10-25 MED ORDER — ACETAMINOPHEN 500 MG PO TABS
1000.0000 mg | ORAL_TABLET | Freq: Four times a day (QID) | ORAL | Status: AC
  Administered 2018-10-25 – 2018-10-26 (×2): 1000 mg via ORAL
  Filled 2018-10-25 (×2): qty 2

## 2018-10-25 MED ORDER — POVIDONE-IODINE 10 % EX SWAB: 2.0000 "application " | Freq: Once | CUTANEOUS | Status: DC

## 2018-10-25 MED ORDER — HYDROMORPHONE HCL 1 MG/ML IJ SOLN
0.5000 mg | INTRAMUSCULAR | Status: DC | PRN
  Administered 2018-10-26 (×2): 0.5 mg via INTRAVENOUS
  Filled 2018-10-25 (×2): qty 1

## 2018-10-25 MED ORDER — HYDROMORPHONE HCL 1 MG/ML IJ SOLN
INTRAMUSCULAR | Status: AC
  Filled 2018-10-25: qty 1

## 2018-10-25 MED ORDER — FENTANYL CITRATE (PF) 100 MCG/2ML IJ SOLN: INTRAMUSCULAR | Status: DC | PRN

## 2018-10-25 MED ORDER — CLONAZEPAM 1 MG PO TABS
1.0000 mg | ORAL_TABLET | Freq: Two times a day (BID) | ORAL | Status: DC
  Administered 2018-10-25 – 2018-10-31 (×12): 1 mg via ORAL
  Filled 2018-10-25 (×12): qty 1

## 2018-10-25 MED ORDER — ONDANSETRON HCL 4 MG/2ML IJ SOLN: 4.0000 mg | Freq: Four times a day (QID) | INTRAMUSCULAR | Status: DC | PRN

## 2018-10-25 MED ORDER — INFLUENZA VAC SPLIT QUAD 0.5 ML IM SUSY: 0.5000 mL | PREFILLED_SYRINGE | INTRAMUSCULAR | Status: DC | PRN

## 2018-10-25 MED ORDER — HYDROMORPHONE HCL 1 MG/ML IJ SOLN: 1.0000 mg | INTRAMUSCULAR | Status: DC | PRN

## 2018-10-25 MED ORDER — PROPOFOL 10 MG/ML IV BOLUS
INTRAVENOUS | Status: AC
  Filled 2018-10-25: qty 20

## 2018-10-25 MED ORDER — METOCLOPRAMIDE HCL 5 MG PO TABS: 5.0000 mg | ORAL_TABLET | Freq: Three times a day (TID) | ORAL | Status: DC | PRN

## 2018-10-25 MED ORDER — PROPOFOL 10 MG/ML IV BOLUS
INTRAVENOUS | Status: DC | PRN
  Administered 2018-10-25: 200 mg via INTRAVENOUS

## 2018-10-25 MED ORDER — GABAPENTIN 600 MG PO TABS
600.0000 mg | ORAL_TABLET | Freq: Two times a day (BID) | ORAL | Status: DC
  Administered 2018-10-26 – 2018-10-31 (×11): 600 mg via ORAL
  Filled 2018-10-25 (×11): qty 1

## 2018-10-25 MED ORDER — ROPINIROLE HCL 1 MG PO TABS
4.0000 mg | ORAL_TABLET | Freq: Three times a day (TID) | ORAL | Status: DC
  Administered 2018-10-25: 4 mg via ORAL
  Filled 2018-10-25: qty 4

## 2018-10-25 MED ORDER — ONDANSETRON HCL 4 MG PO TABS
4.0000 mg | ORAL_TABLET | Freq: Four times a day (QID) | ORAL | Status: DC | PRN
  Administered 2018-10-26: 4 mg via ORAL
  Filled 2018-10-25: qty 1

## 2018-10-25 MED ORDER — TAMSULOSIN HCL 0.4 MG PO CAPS
0.4000 mg | ORAL_CAPSULE | Freq: Every day | ORAL | Status: DC
  Administered 2018-10-26 – 2018-10-31 (×6): 0.4 mg via ORAL
  Filled 2018-10-25 (×6): qty 1

## 2018-10-25 MED ORDER — MIDAZOLAM HCL 2 MG/2ML IJ SOLN
INTRAMUSCULAR | Status: AC
  Filled 2018-10-25: qty 2

## 2018-10-25 MED ORDER — LACTATED RINGERS IV SOLN
INTRAVENOUS | Status: DC | PRN
  Administered 2018-10-25: 17:00:00 via INTRAVENOUS

## 2018-10-25 MED ORDER — MIDAZOLAM HCL 5 MG/5ML IJ SOLN
INTRAMUSCULAR | Status: DC | PRN
  Administered 2018-10-25: 2 mg via INTRAVENOUS

## 2018-10-25 MED ORDER — SODIUM CHLORIDE 0.9 % IV SOLN
1.5000 g | Freq: Four times a day (QID) | INTRAVENOUS | Status: DC
  Administered 2018-10-25 – 2018-10-31 (×22): 1.5 g via INTRAVENOUS
  Filled 2018-10-25 (×26): qty 1.5

## 2018-10-25 MED ORDER — SODIUM CHLORIDE 0.9 % IV SOLN
3.0000 g | Freq: Once | INTRAVENOUS | Status: AC
  Administered 2018-10-25: 3 g via INTRAVENOUS
  Filled 2018-10-25: qty 3

## 2018-10-25 MED ORDER — ROPINIROLE HCL 1 MG PO TABS
5.0000 mg | ORAL_TABLET | Freq: Three times a day (TID) | ORAL | Status: DC
  Administered 2018-10-26 – 2018-10-31 (×16): 5 mg via ORAL
  Filled 2018-10-25 (×2): qty 10
  Filled 2018-10-25: qty 5
  Filled 2018-10-25: qty 10
  Filled 2018-10-25 (×4): qty 5
  Filled 2018-10-25 (×2): qty 10
  Filled 2018-10-25 (×2): qty 5
  Filled 2018-10-25: qty 10
  Filled 2018-10-25: qty 5
  Filled 2018-10-25 (×2): qty 10

## 2018-10-25 MED ORDER — PANTOPRAZOLE SODIUM 40 MG PO TBEC
40.0000 mg | DELAYED_RELEASE_TABLET | Freq: Every day | ORAL | Status: DC
  Administered 2018-10-25 – 2018-10-31 (×7): 40 mg via ORAL
  Filled 2018-10-25 (×7): qty 1

## 2018-10-25 MED ORDER — LACTATED RINGERS IV SOLN
INTRAVENOUS | Status: AC
  Administered 2018-10-25 – 2018-10-26 (×2): via INTRAVENOUS

## 2018-10-25 MED ORDER — ONDANSETRON HCL 4 MG/2ML IJ SOLN
INTRAMUSCULAR | Status: DC | PRN
  Administered 2018-10-25: 4 mg via INTRAVENOUS

## 2018-10-25 MED ORDER — NORTRIPTYLINE HCL 10 MG PO CAPS
10.0000 mg | ORAL_CAPSULE | Freq: Every day | ORAL | Status: DC
  Administered 2018-10-25 – 2018-10-30 (×6): 10 mg via ORAL
  Filled 2018-10-25 (×7): qty 1

## 2018-10-25 MED ORDER — DIFLUPREDNATE 0.05 % OP EMUL: 1.0000 [drp] | Freq: Two times a day (BID) | OPHTHALMIC | Status: DC

## 2018-10-25 MED ORDER — CHLORHEXIDINE GLUCONATE 4 % EX LIQD
60.0000 mL | Freq: Once | CUTANEOUS | Status: DC
  Filled 2018-10-25: qty 60

## 2018-10-25 MED ORDER — DOCUSATE SODIUM 100 MG PO CAPS
100.0000 mg | ORAL_CAPSULE | Freq: Two times a day (BID) | ORAL | Status: DC
  Administered 2018-10-25 – 2018-10-31 (×12): 100 mg via ORAL
  Filled 2018-10-25 (×12): qty 1

## 2018-10-25 MED ORDER — NICOTINE 21 MG/24HR TD PT24
21.0000 mg | MEDICATED_PATCH | Freq: Once | TRANSDERMAL | Status: AC
  Administered 2018-10-25: 21 mg via TRANSDERMAL
  Filled 2018-10-25: qty 1

## 2018-10-25 MED ORDER — OFLOXACIN 0.3 % OP SOLN
1.0000 [drp] | Freq: Two times a day (BID) | OPHTHALMIC | Status: DC
  Administered 2018-10-25 – 2018-10-31 (×12): 1 [drp] via OPHTHALMIC
  Filled 2018-10-25: qty 5

## 2018-10-25 MED ORDER — HYDROMORPHONE HCL 1 MG/ML IJ SOLN
1.0000 mg | INTRAMUSCULAR | Status: DC | PRN
  Administered 2018-10-25 (×2): 1 mg via INTRAVENOUS
  Filled 2018-10-25 (×3): qty 1

## 2018-10-25 MED ORDER — CYCLOBENZAPRINE HCL 10 MG PO TABS
10.0000 mg | ORAL_TABLET | Freq: Three times a day (TID) | ORAL | Status: DC | PRN
  Administered 2018-10-29: 10 mg via ORAL
  Filled 2018-10-25: qty 1

## 2018-10-25 MED ORDER — DIAZEPAM 5 MG PO TABS
5.0000 mg | ORAL_TABLET | Freq: Two times a day (BID) | ORAL | Status: DC
  Administered 2018-10-25 – 2018-10-31 (×12): 5 mg via ORAL
  Filled 2018-10-25 (×12): qty 1

## 2018-10-25 SURGICAL SUPPLY — 57 items
ALCOHOL 70% 16 OZ (MISCELLANEOUS) ×1 IMPLANT
BANDAGE ACE 4X5 VEL STRL LF (GAUZE/BANDAGES/DRESSINGS) IMPLANT
BLADE LONG MED 31MMX9MM (MISCELLANEOUS) ×1
BLADE LONG MED 31X9 (MISCELLANEOUS) ×2 IMPLANT
BNDG COHESIVE 4X5 TAN STRL (GAUZE/BANDAGES/DRESSINGS) IMPLANT
BNDG GAUZE ELAST 4 BULKY (GAUZE/BANDAGES/DRESSINGS) IMPLANT
COVER SURGICAL LIGHT HANDLE (MISCELLANEOUS) ×3 IMPLANT
COVER WAND RF STERILE (DRAPES) ×3 IMPLANT
CUFF TOURNIQUET SINGLE 18IN (TOURNIQUET CUFF) IMPLANT
CUFF TOURNIQUET SINGLE 24IN (TOURNIQUET CUFF) IMPLANT
CUFF TOURNIQUET SINGLE 34IN LL (TOURNIQUET CUFF) IMPLANT
CUFF TOURNIQUET SINGLE 44IN (TOURNIQUET CUFF) IMPLANT
DRAPE U-SHAPE 47X51 STRL (DRAPES) ×3 IMPLANT
DRSG PAD ABDOMINAL 8X10 ST (GAUZE/BANDAGES/DRESSINGS) IMPLANT
DRSG VAC ATS SM SENSATRAC (GAUZE/BANDAGES/DRESSINGS) ×2 IMPLANT
DURAPREP 26ML APPLICATOR (WOUND CARE) IMPLANT
ELECT REM PT RETURN 9FT ADLT (ELECTROSURGICAL) ×3
ELECTRODE REM PT RTRN 9FT ADLT (ELECTROSURGICAL) ×1 IMPLANT
GAUZE SPONGE 4X4 12PLY STRL (GAUZE/BANDAGES/DRESSINGS) IMPLANT
GAUZE XEROFORM 5X9 LF (GAUZE/BANDAGES/DRESSINGS) IMPLANT
GLOVE BIOGEL PI IND STRL 7.5 (GLOVE) ×1 IMPLANT
GLOVE BIOGEL PI IND STRL 8 (GLOVE) ×1 IMPLANT
GLOVE BIOGEL PI INDICATOR 7.5 (GLOVE) ×2
GLOVE BIOGEL PI INDICATOR 8 (GLOVE) ×2
GLOVE ECLIPSE 7.0 STRL STRAW (GLOVE) IMPLANT
GLOVE SURG ORTHO 8.0 STRL STRW (GLOVE) IMPLANT
GOWN STRL REUS W/ TWL LRG LVL3 (GOWN DISPOSABLE) ×2 IMPLANT
GOWN STRL REUS W/ TWL XL LVL3 (GOWN DISPOSABLE) ×1 IMPLANT
GOWN STRL REUS W/TWL LRG LVL3 (GOWN DISPOSABLE) ×6
GOWN STRL REUS W/TWL XL LVL3 (GOWN DISPOSABLE) ×3
HANDPIECE INTERPULSE COAX TIP (DISPOSABLE)
KIT BASIN OR (CUSTOM PROCEDURE TRAY) ×3 IMPLANT
KIT TURNOVER KIT B (KITS) ×3 IMPLANT
MANIFOLD NEPTUNE II (INSTRUMENTS) ×3 IMPLANT
NS IRRIG 1000ML POUR BTL (IV SOLUTION) ×7 IMPLANT
PACK ORTHO EXTREMITY (CUSTOM PROCEDURE TRAY) ×3 IMPLANT
PAD ARMBOARD 7.5X6 YLW CONV (MISCELLANEOUS) ×4 IMPLANT
PAD CAST 4YDX4 CTTN HI CHSV (CAST SUPPLIES) IMPLANT
PADDING CAST COTTON 4X4 STRL (CAST SUPPLIES)
SET HNDPC FAN SPRY TIP SCT (DISPOSABLE) IMPLANT
SPONGE LAP 18X18 X RAY DECT (DISPOSABLE) IMPLANT
SPONGE LAP 4X18 RFD (DISPOSABLE) IMPLANT
STOCKINETTE IMPERVIOUS 9X36 MD (GAUZE/BANDAGES/DRESSINGS) IMPLANT
SUT ETHILON 2 0 FS 18 (SUTURE) IMPLANT
SUT ETHILON 3 0 PS 1 (SUTURE) IMPLANT
SUT VIC AB 3-0 SH 27 (SUTURE)
SUT VIC AB 3-0 SH 27X BRD (SUTURE) IMPLANT
SWAB CULTURE ESWAB REG 1ML (MISCELLANEOUS) IMPLANT
SWAB CULTURE LIQ STUART DBL (MISCELLANEOUS) IMPLANT
TOWEL OR 17X24 6PK STRL BLUE (TOWEL DISPOSABLE) ×3 IMPLANT
TOWEL OR 17X26 10 PK STRL BLUE (TOWEL DISPOSABLE) ×3 IMPLANT
TUBE CONNECTING 12'X1/4 (SUCTIONS) ×1
TUBE CONNECTING 12X1/4 (SUCTIONS) ×2 IMPLANT
UNDERPAD 30X30 (UNDERPADS AND DIAPERS) ×3 IMPLANT
WATER STERILE IRR 1000ML POUR (IV SOLUTION) ×1 IMPLANT
WND VAC CANISTER 500ML (MISCELLANEOUS) ×2 IMPLANT
YANKAUER SUCT BULB TIP NO VENT (SUCTIONS) ×3 IMPLANT

## 2018-10-25 NOTE — ED Triage Notes (Addendum)
Pt states she awoke this morning to find her dog had chewed off her great toe on the left. Bleeding is controlled.  Pt states she has neuropathy and cannot feel that foot. Bleeding controlled.

## 2018-10-25 NOTE — ED Provider Notes (Signed)
[facility] EMERGENCY DEPARTMENT Provider Note   CSN: [id number] Arrival date & time: 10/25/18  0609     History   Chief Complaint Chief Complaint  Patient presents with  . Animal Bite    HPI [name] is a 52 y.o. female presenting for evaluation of L great toe amputation.   Pt states she was asleep last night and when she woke up this morning she noticed that her great toe had been chewed off by her dog.  She reports initially no pain, but reports gradually increasing pain of her left great toe.  Patient with a history of significant neuropathy.  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 Sharol Given.  She is not on blood thinners.  She is not on antibiotics currently.  She denies injury elsewhere.   Additional history obtained from chart review, patient with a history of bipolar, claudication, COPD, depression, diabetes, neuropathy, stroke.  Patient recently seen by podiatry, was found to have onychomycosis of toes 1 through 5 on the left without signs of infection.  No open lesions or ulcers at that time.  HPI  Past Medical History:  Diagnosis Date  . Anxiety   . Arthritis   . Bipolar disorder ([place])   . Broken jaw ([place])   . Broken wrist   . Chronic back pain   . Claudication ([place])    a. 12/2013 ABI's: R 0.97, L 0.94.  [name] COPD (chronic obstructive pulmonary disease) ([place])   . Depression   . Diabetic Charcot's foot ([place])   . Diabetic foot ulcer (HCC)    chronic left foot ulcer , great toe/  hx recurrent foot ulcer bilaterally  . DJD (degenerative joint disease)   . Edema of both lower extremities   . Episode of memory loss   . GERD (gastroesophageal reflux disease)   . Headache(784.0)   . Hiatal hernia   . History of colon cancer, stage I dx 02-26-2015--- oncologist-- dr [name]--- per last note no recurrence   05-16-2015 s/p  Laparoscopic sigmoid colectomy w/ node bx's (negative nodes per path)  Stage I (pT1,N0,M0) Grade 2  . History of CVA with residual  deficit 02-12-2014  post op cardiac cath.   per MRI multiple small strokes post cardiac cath. --  residual mild memory loss  . History of methicillin resistant staphylococcus aureus (MRSA)   . Hypercholesterolemia   . Insomnia   . Insulin dependent type 2 diabetes mellitus, uncontrolled ([place]) dx 2004   endocrinologist-  dr Dwyane Dee-- last A1c 8.8 in Aug2018:  pt is noncompliant w/ diet, states does note eat breakfast , her first main meal in afternoon  . Neurogenic bladder   . Neuropathy, diabetic ([place])    hands and feet  . OSA (obstructive sleep apnea)    cpap intolerant  . Personality disorder ([place])   . Restless leg syndrome   . SOB (shortness of breath) on exertion   . Stroke ([place])   . SUI (stress urinary incontinence, female) S/P SLING 12-29-2011    Patient Active Problem List   Diagnosis Date Noted  . Cellulitis of left upper extremity   . Poorly controlled diabetes mellitus ([place])   . UTI (urinary tract infection) 05/23/2018  . Acute metabolic encephalopathy 25/04/3975  . Toxic encephalopathy 05/09/2018  . GERD (gastroesophageal reflux disease) 05/09/2018  . Tobacco abuse 05/09/2018  . Polypharmacy 05/09/2018  . Restless leg syndrome 04/27/2018  . Anxiety 04/27/2018  . Hyponatremia 04/26/2018  . Noncompliance with dietary restriction  04/26/2018  . Postoperative cellulitis of surgical wound 04/26/2018  . At high risk for falls 04/24/2018  . Noncompliance with safety precautions 04/24/2018  . Leukocytosis 04/24/2018  . 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 PAD (peripheral artery disease) ([place])   . Poorly controlled type 2 diabetes mellitus ([place])   . Subacute osteomyelitis, right ankle and foot ([place])   . Major depressive disorder, recurrent episode ([place]) 04/11/2018  . Diabetic ulcer of right midfoot associated with type 2 diabetes mellitus, with necrosis of bone ([place])   . Abscess of ankle   . Infectious synovitis   . Diabetic foot infection ([place])   . Sepsis ([place])  04/09/2018  . Cancer of sigmoid colon ([place]) 03/09/2015  . OSA (obstructive sleep apnea) 07/04/2014  . Migraine without aura, with intractable migraine, so stated, without mention of status migrainosus 03/26/2014  . Peripheral neuropathy 03/03/2014  . Abnormal stress test 02/12/2014  . CVA (cerebral infarction) 02/12/2014  . Chest pain   . Abnormal heart rhythm   . Edema   . Hypertension   . Hyperlipemia   . Hypercholesterolemia   . Other and unspecified hyperlipidemia 11/14/2013  . Type II diabetes mellitus, uncontrolled ([place]) 07/22/2013  . Essential hypertension 07/22/2013  . Diabetic neuropathy with neurologic complication ([place]) 56/81/2751  . Diabetic polyneuropathy ([place]) 07/22/2013    Past Surgical History:  Procedure Laterality Date  . AMPUTATION Right 04/13/2018   Procedure: RIGHT BELOW KNEE AMPUTATION;  Surgeon: Newt Minion, MD;  Location: [facility];  Service: Orthopedics;  Laterality: Right;  . ANTERIOR CERVICAL DECOMP/DISCECTOMY FUSION  2000   C5 - 7  . BACK SURGERY    . CARDIAC CATHETERIZATION  05-22-2008   DR SKAINS   NO SIGNIFECANT CAD/ NORMAL LV/  EF 65%/  NO WALL MOTION ABNORMALITIES  . CARPAL TUNNEL RELEASE Right 04-25-2013  . [facility]; 1992  . COLON SURGERY    . COLONOSCOPY    . CYSTO N/A 04/30/2013   Procedure: CYSTOSCOPY;  Surgeon: Reece Packer, MD;  Location: [facility];  Service: Urology;  Laterality: N/A;  . CYSTOSCOPY MACROPLASTIQUE IMPLANT N/A 02/06/2018   Procedure: CYSTOSCOPY MACROPLASTIQUE IMPLANT;  Surgeon: Bjorn Loser, MD;  Location: [facility];  Service: Urology;  Laterality: N/A;  . CYSTOSCOPY WITH INJECTION  05/04/2012   Procedure: CYSTOSCOPY WITH INJECTION;  Surgeon: Reece Packer, MD;  Location: [facility];  Service: Urology;  Laterality: N/A;  MACROPLASTIQUE INJECTION  . CYSTOSCOPY WITH INJECTION  08/28/2012   Procedure: CYSTOSCOPY WITH INJECTION;  Surgeon: Reece Packer, MD;  Location:  [facility];  Service: Urology;  Laterality: N/A;  cysto and macroplastique   . FOOT SURGERY Bilateral    "related to [place]"  . HERNIA REPAIR  ?1996   "stomach"  . INCISION AND DRAINAGE OF WOUND Right 04/11/2018   Procedure: IRRIGATION AND DEBRIDEMENT WOUND right foot and right ankle;  Surgeon: Evelina Bucy, DPM;  Location: [facility];  Service: Podiatry;  Laterality: Right;  . KNEE ARTHROSCOPY Left   . KNEE ARTHROSCOPY W/ ALLOGRAFT IMPANT Left    graft x 2  . KNEE SURGERY     TOTAL 8 SURG'S  . LAPAROSCOPIC SIGMOID COLECTOMY N/A 04/22/2015   Procedure: LAPAROSCOPIC HAND ASSISTED SIGMOID COLECTOMY;  Surgeon: Erroll Luna, MD;  Location: [facility];  Service: General;  Laterality: N/A;  . LEFT HEART CATHETERIZATION WITH CORONARY ANGIOGRAM N/A 02/12/2014   Procedure: LEFT HEART CATHETERIZATION WITH CORONARY ANGIOGRAM;  Surgeon: Candee Furbish,  MD;  Location: [facility];  Service: Cardiovascular;  Laterality: N/A;   No angiographically significant CAD; normal LVSF, LVEDP 108mmHg,  EF 55% (new finding ef 30% myoview 01-08-2014)  . LUMBAR FUSION    . MANDIBLE FRACTURE SURGERY    . MULTIPLE LAPAROSCOPIES FOR ENDOMETRIOSIS    . PUBOVAGINAL SLING  12/29/2011   Procedure: Gaynelle Arabian;  Surgeon: Reece Packer, MD;  Location: [facility];  Service: Urology;  Laterality: N/A;  cysto and sparc sling   . PUBOVAGINAL SLING N/A 04/30/2013   Procedure: REMOVAL OF VAGINAL MESH;  Surgeon: Reece Packer, MD;  Location[facility];  Service: Urology;  Laterality: N/A;  . RECONSTURCTION OF CONGENITAL UTERUS ANOMALY  1983  . REPEAT RECONSTRUCTION ACL LEFT KNEE/ SCREWS REMOVED  03-28-2000   CADAVER GRAFT  . TOTAL ABDOMINAL HYSTERECTOMY  1997   w/BSO  . TRANSTHORACIC ECHOCARDIOGRAM  02/13/2014   ef 45%, hypokinesis base inferior and base inferolateral walls  . UPPER GI ENDOSCOPY    . WOUND DEBRIDEMENT Right 09/05/2016   Procedure: DEBRIDEMENT WOUND WITH GRAFT RIGHT FOOT;  Surgeon:  Landis Martins, DPM;  Location: [facility];  Service: Podiatry;  Laterality: Right;  . WRIST FRACTURE SURGERY       OB History   None      Home Medications    Prior to Admission medications   Medication Sig Start Date End Date Taking? Authorizing Provider  HETLIOZ 20 MG CAPS Take 20 mg by mouth at bedtime. 05/02/18  Yes [provider]  INGREZZA 80 MG CAPS Take 80 mg by mouth at bedtime. 05/01/18  Yes [provider]  insulin aspart (NOVOLOG) 100 UNIT/ML injection Inject 6 Units into the skin 3 (three) times daily with meals. Take extra dose according to the chart provided. 07/05/18  Yes Lorella Nimrod, MD  Insulin Glargine (BASAGLAR KWIKPEN) 100 UNIT/ML SOPN Inject 0.22 mLs (22 Units total) into the skin daily. 07/05/18  Yes Lorella Nimrod, MD  amoxicillin-clavulanate (AUGMENTIN) 875-125 MG tablet amoxicillin 875 mg-potassium clavulanate 125 mg tablet    [provider]  Armodafinil 250 MG tablet armodafinil 250 mg tablet    [provider]  aspirin-acetaminophen-caffeine (EXCEDRIN MIGRAINE) 937-342-87 MG tablet Take 2 tablets by mouth every 6 (six) hours as needed for headache.    [provider]  bisacodyl (DULCOLAX) 5 MG EC tablet Take 1 tablet (5 mg total) by mouth daily as needed for moderate constipation. 04/16/18   Elgergawy, Silver Huguenin, MD  clonazePAM (KLONOPIN) 1 MG tablet Take 1 mg by mouth 3 (three) times daily as needed for anxiety or agitation. 06/22/18   [provider]  cyclobenzaprine (FLEXERIL) 10 MG tablet Take 10 mg by mouth 3 (three) times daily as needed for muscle spasms.  05/18/18   [provider]  diazepam (VALIUM) 5 MG tablet  10/07/18   [provider]  doxycycline (VIBRA-TABS) 100 MG tablet Take 1 tablet (100 mg total) by mouth 2 (two) times daily. 09/17/18   Newt Minion, MD  empagliflozin (JARDIANCE) 25 MG TABS tablet Take 25 mg by mouth daily.    [provider]  empagliflozin (JARDIANCE) 25 MG  TABS tablet Take 25 mg by mouth daily. 10/22/18   Shamleffer, Melanie Crazier, MD  esomeprazole (NEXIUM) 40 MG capsule  10/07/18   [provider]  gabapentin (NEURONTIN) 600 MG tablet Take 1 tablet (600 mg total) by mouth 2 (two) times daily. Take in the morning and afternoon. Do not take at  night time. 05/10/18   Mariel Aloe, MD  glucose blood (FREESTYLE TEST STRIPS) test strip USE TO CHECK BLOOD SUGARS DAILY. DX CODE E11.65 08/09/18   Elayne Snare, MD  insulin aspart (NOVOLOG FLEXPEN) 100 UNIT/ML FlexPen Inject 6 units with each meal daily and use sliding scale for further dosage 10/16/18   Elayne Snare, MD  lidocaine (LIDODERM) 5 % Place 1 patch onto the skin daily as needed (pain). Remove & Discard patch within 12 hours or as directed by MD 02/27/18   Landis Martins, DPM  Melatonin 10 MG CAPS melatonin 10 mg capsule  ONE BY MOUTH AT BEDTIME INSTEAD OF AMBIEN    [provider]  mineral oil-hydrophilic petrolatum (AQUAPHOR) ointment Apply topically as needed for dry skin. 05/29/18   Landis Martins, DPM  nortriptyline (PAMELOR) 10 MG capsule Take 10 mg by mouth at bedtime. 05/17/18   [provider]  ondansetron (ZOFRAN) 4 MG tablet Take 1 tablet (4 mg total) by mouth every 6 (six) hours as needed for nausea. 04/16/18   Elgergawy, Silver Huguenin, MD  Oxycodone HCl 10 MG TABS Take 10 mg by mouth every 6 (six) hours as needed for severe pain.    [provider]  oxyCODONE-acetaminophen (PERCOCET) 10-325 MG tablet Take 1 tablet by mouth every 8 (eight) hours as needed for pain. 05/03/18   Newt Minion, MD  oxyCODONE-acetaminophen (PERCOCET) 10-325 MG tablet Take 1 tablet by mouth every 4 (four) hours as needed for pain. 09/17/18   Newt Minion, MD  pantoprazole (PROTONIX) 40 MG tablet Take 1 tablet (40 mg total) by mouth daily. 04/27/18   Love, Ivan Anchors, PA-C  QUEtiapine (SEROQUEL) 300 MG tablet Take 600 mg by mouth at bedtime. 06/12/18   [provider]  ropinirole  (REQUIP) 5 MG tablet Take 1 tablet (5 mg total) by mouth 3 (three) times daily. 10/09/18   Landis Martins, DPM  silver sulfADIAZINE (SILVADENE) 1 % cream Apply 1 application topically as needed. 06/21/18   [provider]  sulfamethoxazole-trimethoprim (BACTRIM DS,SEPTRA DS) 800-160 MG tablet sulfamethoxazole 800 mg-trimethoprim 160 mg tablet    [provider]  tamsulosin (FLOMAX) 0.4 MG CAPS capsule Take 0.4 mg by mouth daily. 05/17/18   [provider]  VRAYLAR 4.5 MG CAPS Take 4.5 mg by mouth at bedtime. 05/31/18   [provider]    Family History Family History  Problem Relation Age of Onset  . Hypertension Mother   . Diabetes Mother   . Cancer - Other Mother        lymphoma   . Cancer - Other Father        lung, bladder cancer   . Heart attack Father   . Cancer - Other Brother        bladder cancer     Social History Social History   Tobacco Use  . Smoking status: Current Every Day Smoker    Packs/day: 1.50    Years: 48.00    Pack years: 72.00    Types: Cigarettes  . Smokeless tobacco: Never Used  . Tobacco comment: per pt started smoking  age 67  Substance Use Topics  . Alcohol use: No    Comment: 05/24/2018 "special occasions only"  . Drug use: Not Currently     Allergies   Latex; Sweetening enhancer [flavoring agent]; Aspartame and phenylalanine; Ibuprofen; Trazodone and nefazodone; and Triazolam   Review of Systems Review of Systems  Musculoskeletal:       L great  toe amputation  All other systems reviewed and are negative.    Physical Exam Updated Vital Signs BP 140/82   Pulse 87   Temp 98 F (36.7 C) (Oral)   Resp 14   SpO2 96%   Physical Exam  Constitutional: She is oriented to person, place, and time. No distress.  Chronically ill appearing female.  HENT:  Head: Normocephalic and atraumatic.  Eyes: Pupils are equal, round, and reactive to light. Conjunctivae and EOM are normal.  Neck: Normal range of  motion. Neck supple.  Cardiovascular: Normal rate, regular rhythm and intact distal pulses.  Pulmonary/Chest: Effort normal and breath sounds normal. No respiratory distress. She has no wheezes.  Abdominal: Soft. She exhibits no distension and no mass. There is no tenderness. There is no guarding.  Musculoskeletal:  Left great toe with apparent traumatic amputation.  Hair and dog fur matted in the wound.  No active bleeding.  See picture below.  Neurological: She is alert and oriented to person, place, and time.  Skin: Skin is warm and dry.  Psychiatric: She has a normal mood and affect.  Nursing note and vitals reviewed.        ED Treatments / Results  Labs (all labs ordered are listed, but only abnormal results are displayed) Labs Reviewed  CBC WITH DIFFERENTIAL/PLATELET - Abnormal; Notable for the following components:      Result Value   WBC 17.8 (*)    Neutro Abs 14.4 (*)    Abs Immature Granulocytes 0.10 (*)    All other components within normal limits  BASIC METABOLIC PANEL - Abnormal; Notable for the following components:   Potassium 3.3 (*)    CO2 21 (*)    Glucose, Bld 381 (*)    BUN 5 (*)    All other components within normal limits  CBG MONITORING, ED - Abnormal; Notable for the following components:   Glucose-Capillary 337 (*)    All other components within normal limits    EKG None  Radiology Dg Chest Portable 1 View  Result Date: 10/25/2018 CLINICAL DATA:  Preop for left great toe surgery. History of COPD and dyspnea on exertion. Current smoker. EXAM: PORTABLE CHEST 1 VIEW COMPARISON:  07/03/2018 FINDINGS: Heart, mediastinum and hila are within normal limits. Clear lungs.  No pleural effusion or pneumothorax. Status post previous anterior cervical spine fusion. Skeletal structures are grossly intact. IMPRESSION: No active disease. Electronically Signed   By: Lajean Manes M.D.   On: 10/25/2018 07:48   Dg Foot Complete Left  Result Date:  10/25/2018 CLINICAL DATA:  Pt's dog ate pt's 1st toe on Lt foot completely off while she slept last night, initial encounter EXAM: LEFT FOOT - COMPLETE 3+ VIEW COMPARISON:  None. FINDINGS: There is a bony defect in the first toe with loss of the phalanges as well as a portion of the first metatarsal head consistent with the patient's given clinical history. Considerable soft tissue defect is noted consistent with the given clinical history as well. No radiopaque foreign body is seen. No definitive erosive changes in the bone are noted. Degenerative changes in the tarsal bones are seen. Calcaneal spurring is noted. IMPRESSION: Changes consistent with the given clinical history of dog eating first toe. The bony defect extends into the head of the first metatarsal. Electronically Signed   By: Inez Catalina M.D.   On: 10/25/2018 07:21    Procedures .Critical Care Performed by: Franchot Heidelberg, PA-C Authorized by: Franchot Heidelberg, PA-C   Critical care  provider statement:    Critical care time (minutes):  40   Critical care time was exclusive of:  Separately billable procedures and treating other patients and teaching time   Critical care was necessary to treat or prevent imminent or life-threatening deterioration of the following conditions:  Trauma   Critical care was time spent personally by me on the following activities:  Blood draw for specimens, development of treatment plan with patient or surrogate, discussions with consultants, evaluation of patient's response to treatment, examination of patient, obtaining history from patient or surrogate, ordering and performing treatments and interventions, ordering and review of laboratory studies, ordering and review of radiographic studies, pulse oximetry, re-evaluation of patient's condition and review of old charts   I assumed direction of critical care for this patient from another provider in my specialty: no   Comments:     Pt with L great toe  ampuation and high risk for infection requiring OR washout. IV abx started.    (including critical care time)  Medications Ordered in ED Medications  nicotine (NICODERM CQ - dosed in mg/24 hours) patch 21 mg (21 mg Transdermal Patch Applied 10/25/18 0728)  Ampicillin-Sulbactam (UNASYN) 3 g in sodium chloride 0.9 % 100 mL IVPB (0 g Intravenous Stopped 10/25/18 0757)  morphine 4 MG/ML injection 4 mg (4 mg Intravenous Given 10/25/18 0724)  morphine 4 MG/ML injection 4 mg (4 mg Intravenous Given 10/25/18 0846)     Initial Impression / Assessment and Plan / ED Course  I have reviewed the triage vital signs and the nursing notes.  Pertinent labs & imaging results that were available during my care of the patient were reviewed by me and considered in my medical decision making (see chart for details).     Pt presenting for evaluation of great toe amputation.  Physical exam concerning, patient with open wound of the left foot and obvious great toe amputation.  No active bleeding.  Heart rate and blood pressure stable.  She is not on blood thinners.  Concern for high risk for infection due to diabetes and dog bite.  Case discussed with attending, Dr. Roxanne Mins evaluated the patient.  Will order labs and x-ray.  Chest x-ray and EKG for preop clearance, as patient will likely need OR management.  Will consult with orthopedics.  Antibiotics and pain control started.  Labs show elevated wbc and poorly controlled DM with bgl of 330's. ekg without stemi. cxr without acute findings. Xray of foot viewed and interpreted by me, consistent with L great toe ampuation with damage extending into the 1st metatarsal.   Discussed with Dr. Marlou Sa, who recommends OR washout.  Patient to be seen by orthopedics and taken to the OR.   Final Clinical Impressions(s) / ED Diagnoses   Final diagnoses:  Left great toe amputee [facility])    ED Discharge Orders    None       Franchot Heidelberg, PA-C 92/01/00 7121    Delora Fuel, MD 97/58/83 2237

## 2018-10-25 NOTE — Progress Notes (Signed)
Inpatient Diabetes Program Recommendations  AACE/ADA: New Consensus Statement on Inpatient Glycemic Control (2015)  Target Ranges:  Prepandial:   less than 140 mg/dL      Peak postprandial:   less than 180 mg/dL (1-2 hours)      Critically ill patients:  140 - 180 mg/dL   Lab Results  Component Value Date   GLUCAP 198 (H) 10/25/2018   HGBA1C 10.6 (H) 07/03/2018    Review of Glycemic Control Results for [name], [name] (MRN [id number]) as of 10/25/2018 16:39  Ref. Range 10/25/2018 07:28 10/25/2018 14:53  Glucose-Capillary Latest Ref Range: 70 - 99 mg/dL 337 (H) 198 (H)   Diabetes history: DM2 Outpatient Diabetes medications:  Jardiance 25 mg daily, Novolog 6 units tid with meals, Basaglar 22 units daily Inpatient Diabetes Program Recommendations:   Please consider adding Lantus 15 units daily.  Also consider Novolog sensitive tid with meals and HS.  Also please add Novolog meal coverage 3 units tid with meals.   Thanks,  Adah Perl, RN, BC-ADM Inpatient Diabetes Coordinator Pager [phone] (8a-5p)

## 2018-10-25 NOTE — Consult Note (Addendum)
Reason for Consult:Dog bite Referring Physician: [name] is an 52 y.o. female.  HPI: [name] was asleep last night and her dog ate her big toe of her left foot. She has significant neuropathy. She got up to use the bathroom and noticed the blood. She came to the ED for evaluation and orthopedic surgery was consulted. She c/o expected foot pain.  Past Medical History:  Diagnosis Date  . Anxiety   . Arthritis   . Bipolar disorder ([place])   . Broken jaw ([place])   . Broken wrist   . Chronic back pain   . Claudication ([place])    a. 12/2013 ABI's: R 0.97, L 0.94.  [name] COPD (chronic obstructive pulmonary disease) ([place])   . Depression   . Diabetic Charcot's foot ([place])   . Diabetic foot ulcer ([facility])    chronic left foot ulcer , great toe/  hx recurrent foot ulcer bilaterally  . DJD (degenerative joint disease)   . Edema of both lower extremities   . Episode of memory loss   . GERD (gastroesophageal reflux disease)   . Headache(784.0)   . Hiatal hernia   . History of colon cancer, stage I dx 02-26-2015--- oncologist-- dr [name]--- per last note no recurrence   05-16-2015 s/p  Laparoscopic sigmoid colectomy w/ node bx's (negative nodes per path)  Stage I (pT1,N0,M0) Grade 2  . 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   per MRI multiple small strokes post cardiac cath. --  residual mild memory loss  . History of methicillin resistant staphylococcus aureus (MRSA)   . Hypercholesterolemia   . Insomnia   . Insulin dependent type 2 diabetes mellitus, uncontrolled ([place]) dx 2004   endocrinologist-  dr Dwyane Dee-- last A1c 8.8 in Aug2018:  pt is noncompliant w/ diet, states does note eat breakfast , her first main meal in afternoon  . Neurogenic bladder   . Neuropathy, diabetic ([place])    hands and feet  . OSA (obstructive sleep apnea)    cpap intolerant  . Personality disorder ([place])   . Restless leg syndrome   . SOB (shortness of breath) on exertion   . Stroke  ([place])   . SUI (stress urinary incontinence, female) S/P SLING 12-29-2011    Past Surgical History:  Procedure Laterality Date  . AMPUTATION Right 04/13/2018   Procedure: RIGHT BELOW KNEE AMPUTATION;  Surgeon: Newt Minion, MD;  Location: [facility];  Service: Orthopedics;  Laterality: Right;  . ANTERIOR CERVICAL DECOMP/DISCECTOMY FUSION  2000   C5 - 7  . BACK SURGERY    . CARDIAC CATHETERIZATION  05-22-2008   DR SKAINS   NO SIGNIFECANT CAD/ NORMAL LV/  EF 65%/  NO WALL MOTION ABNORMALITIES  . CARPAL TUNNEL RELEASE Right 04-25-2013  . [place]; 1992  . COLON SURGERY    . COLONOSCOPY    . CYSTO N/A 04/30/2013   Procedure: CYSTOSCOPY;  Surgeon: Reece Packer, MD;  [facility];  Service: Urology;  Laterality: N/A;  . CYSTOSCOPY MACROPLASTIQUE IMPLANT N/A 02/06/2018   Procedure: CYSTOSCOPY MACROPLASTIQUE IMPLANT;  Surgeon: Bjorn Loser, MD;  Location: [facility];  Service: Urology;  Laterality: N/A;  . CYSTOSCOPY WITH INJECTION  05/04/2012   Procedure: CYSTOSCOPY WITH INJECTION;  Surgeon: Reece Packer, MD;  Location: [facility];  Service: Urology;  Laterality: N/A;  MACROPLASTIQUE INJECTION  . CYSTOSCOPY WITH INJECTION  08/28/2012   Procedure: CYSTOSCOPY WITH INJECTION;  Surgeon: Reece Packer, MD;  Location: [facility];  Service: Urology;  Laterality: N/A;  cysto and macroplastique   . FOOT SURGERY Bilateral    "related to [place]"  . HERNIA REPAIR  ?1996   "stomach"  . INCISION AND DRAINAGE OF WOUND Right 04/11/2018   Procedure: IRRIGATION AND DEBRIDEMENT WOUND right foot and right ankle;  Surgeon: Evelina Bucy, DPM;  [facility];  Service: Podiatry;  Laterality: Right;  . KNEE ARTHROSCOPY Left   . KNEE ARTHROSCOPY W/ ALLOGRAFT IMPANT Left    graft x 2  . KNEE SURGERY     TOTAL 8 SURG'S  . LAPAROSCOPIC SIGMOID COLECTOMY N/A 04/22/2015   Procedure: LAPAROSCOPIC HAND ASSISTED SIGMOID COLECTOMY;   Surgeon: Erroll Luna, MD;  Location: [facility];  Service: General;  Laterality: N/A;  . LEFT HEART CATHETERIZATION WITH CORONARY ANGIOGRAM N/A 02/12/2014   Procedure: LEFT HEART CATHETERIZATION WITH CORONARY ANGIOGRAM;  Surgeon: Candee Furbish, MD;  Location: [facility] C[facility];  Service: Cardiovascular;  Laterality: N/A;   No angiographically significant CAD; normal LVSF, LVEDP 31mHg,  EF 55% (new finding ef 30% myoview 01-08-2014)  . LUMBAR FUSION    . MANDIBLE FRACTURE SURGERY    . MULTIPLE LAPAROSCOPIES FOR ENDOMETRIOSIS    . PUBOVAGINAL SLING  12/29/2011   Procedure: PGaynelle Arabian  Surgeon: SReece Packer MD;  [facility]  Service: Urology;  Laterality: N/A;  cysto and sparc sling   . PUBOVAGINAL SLING N/A 04/30/2013   Procedure: REMOVAL OF VAGINAL MESH;  Surgeon: SReece Packer MD;  [facility];  Service: Urology;  Laterality: N/A;  . RECONSTURCTION OF CONGENITAL UTERUS ANOMALY  1983  . REPEAT RECONSTRUCTION ACL LEFT KNEE/ SCREWS REMOVED  03-28-2000   CADAVER GRAFT  . TOTAL ABDOMINAL HYSTERECTOMY  1997   w/BSO  . TRANSTHORACIC ECHOCARDIOGRAM  02/13/2014   ef 45%, hypokinesis base inferior and base inferolateral walls  . UPPER GI ENDOSCOPY    . WOUND DEBRIDEMENT Right 09/05/2016   Procedure: DEBRIDEMENT WOUND WITH GRAFT RIGHT FOOT;  Surgeon: TLandis Martins DPM;  [place] [facility]  Service: Podiatry;  Laterality: Right;  . WRIST FRACTURE SURGERY      Family History  Problem Relation Age of Onset  . Hypertension Mother   . Diabetes Mother   . Cancer - Other Mother        lymphoma   . Cancer - Other Father        lung, bladder cancer   . Heart attack Father   . Cancer - Other Brother        bladder cancer     Social History:  reports that she has been smoking cigarettes. She has a 72.00 pack-year smoking history. She has never used smokeless tobacco. She reports that she has current or past drug history. She reports that she does not drink  alcohol.  Allergies:  Allergies  Allergen Reactions  . Latex Itching, Rash and Other (See Comments)    Pt states she cannot use condoms - cause an infection.  Use of latex on skin is okay.  Tape causes rash  . Sweetening Enhancer [Flavoring Agent] Nausea And Vomiting and Other (See Comments)    HEADACHES  . Aspartame And Phenylalanine Nausea And Vomiting    HEADACHES  . Ibuprofen Other (See Comments)    HEADACHES  . Trazodone And Nefazodone Other (See Comments)    Hallucinations   . Triazolam Other (See Comments)    HALLUCINATIONS  Medications: I have reviewed the patient's current medications.  Results for orders placed or performed during the hospital encounter of 10/25/18 (from the past 48 hour(s))  CBC with Differential     Status: Abnormal   Collection Time: 10/25/18  6:34 AM  Result Value Ref Range   WBC 17.8 (H) 4.0 - 10.5 K/uL   RBC 4.57 3.87 - 5.11 MIL/uL   Hemoglobin 13.4 12.0 - 15.0 g/dL   HCT 42.2 36.0 - 46.0 %   MCV 92.3 80.0 - 100.0 fL   MCH 29.3 26.0 - 34.0 pg   MCHC 31.8 30.0 - 36.0 g/dL   RDW 12.8 11.5 - 15.5 %   Platelets 246 150 - 400 K/uL   nRBC 0.0 0.0 - 0.2 %   Neutrophils Relative % 81 %   Neutro Abs 14.4 (H) 1.7 - 7.7 K/uL   Lymphocytes Relative 13 %   Lymphs Abs 2.4 0.7 - 4.0 K/uL   Monocytes Relative 4 %   Monocytes Absolute 0.7 0.1 - 1.0 K/uL   Eosinophils Relative 1 %   Eosinophils Absolute 0.2 0.0 - 0.5 K/uL   Basophils Relative 0 %   Basophils Absolute 0.1 0.0 - 0.1 K/uL   Immature Granulocytes 1 %   Abs Immature Granulocytes 0.10 (H) 0.00 - 0.07 K/uL    Comment: Performed at [facility], [address] [place], [place] [address]  Basic metabolic panel     Status: Abnormal   Collection Time: 10/25/18  6:34 AM  Result Value Ref Range   Sodium 138 135 - 145 mmol/L   Potassium 3.3 (L) 3.5 - 5.1 mmol/L   Chloride 108 98 - 111 mmol/L   CO2 21 (L) 22 - 32 mmol/L   Glucose, Bld 381 (H) 70 - 99 mg/dL   BUN 5 (L) 6 - 20 mg/dL    Creatinine, Ser 0.68 0.44 - 1.00 mg/dL   Calcium 9.0 8.9 - 10.3 mg/dL   GFR calc non Af Amer >60 >60 mL/min   GFR calc Af Amer >60 >60 mL/min    Comment: (NOTE) The eGFR has been calculated using the CKD EPI equation. This calculation has not been validated in all clinical situations. eGFR's persistently <60 mL/min signify possible Chronic Kidney Disease.    Anion gap 9 5 - 15    Comment: Performed at [facility] [address], [place], [place] [address]  POC CBG, ED     Status: Abnormal   Collection Time: 10/25/18  7:28 AM  Result Value Ref Range   Glucose-Capillary 337 (H) 70 - 99 mg/dL    Dg Chest Portable 1 View  Result Date: 10/25/2018 CLINICAL DATA:  Preop for left great toe surgery. History of COPD and dyspnea on exertion. Current smoker. EXAM: PORTABLE CHEST 1 VIEW COMPARISON:  07/03/2018 FINDINGS: Heart, mediastinum and hila are within normal limits. Clear lungs.  No pleural effusion or pneumothorax. Status post previous anterior cervical spine fusion. Skeletal structures are grossly intact. IMPRESSION: No active disease. Electronically Signed   By: Lajean Manes M.D.   On: 10/25/2018 07:48   Dg Foot Complete Left  Result Date: 10/25/2018 CLINICAL DATA:  Pt's dog ate pt's 1st toe on Lt foot completely off while she slept last night, initial encounter EXAM: LEFT FOOT - COMPLETE 3+ VIEW COMPARISON:  None. FINDINGS: There is a bony defect in the first toe with loss of the phalanges as well as a portion of the first metatarsal head consistent with the  patient's given clinical history. Considerable soft tissue defect is noted consistent with the given clinical history as well. No radiopaque foreign body is seen. No definitive erosive changes in the bone are noted. Degenerative changes in the tarsal bones are seen. Calcaneal spurring is noted. IMPRESSION: Changes consistent with the given clinical history of dog eating first toe. The bony defect extends into the head of the  first metatarsal. Electronically Signed   By: Inez Catalina M.D.   On: 10/25/2018 07:21    Review of Systems  Constitutional: Negative for weight loss.  HENT: Negative for ear discharge, ear pain, hearing loss and tinnitus.   Eyes: Negative for blurred vision, double vision, photophobia and pain.  Respiratory: Negative for cough, sputum production and shortness of breath.   Cardiovascular: Negative for chest pain.  Gastrointestinal: Negative for abdominal pain, nausea and vomiting.  Genitourinary: Negative for dysuria, flank pain, frequency and urgency.  Musculoskeletal: Positive for joint pain (Left foot). Negative for back pain, falls, myalgias and neck pain.  Neurological: Negative for dizziness, tingling, sensory change, focal weakness, loss of consciousness and headaches.  Endo/Heme/Allergies: Does not bruise/bleed easily.  Psychiatric/Behavioral: Negative for depression, memory loss and substance abuse. The patient is not nervous/anxious.    Blood pressure 140/82, pulse 87, temperature 98 F (36.7 C), temperature source Oral, resp. rate 14, SpO2 96 %. Physical Exam  Constitutional: She appears well-developed and well-nourished. No distress.  HENT:  Head: Normocephalic and atraumatic.  Eyes: Conjunctivae are normal. Right eye exhibits no discharge. Left eye exhibits no discharge. No scleral icterus.  Neck: Normal range of motion.  Cardiovascular: Normal rate and regular rhythm.  Respiratory: Effort normal. No respiratory distress.  Musculoskeletal:  LLE No ecchymosis or rash  Great toe missing, wound grossly contaminated  No knee or ankle effusion  Knee stable to varus/ valgus and anterior/posterior stress  Sens DPN, SPN, TN paresthetic  Motor ext, flex, evers 5/5  DP 2+, PT 2+, No significant edema  Neurological: She is alert.  Skin: Skin is warm and dry. She is not diaphoretic.  Psychiatric: She has a normal mood and affect. Her behavior is normal.       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 Marlou Sa. NPO until then. Plan discharge after surgery. Multiple medical problems including uncontrolled DM, bipolar, claudication, COPD, depression, neuropathy, and stroke    Lisette Abu, PA-C Orthopedic Surgery [phone] 10/25/2018, 9:39 AM

## 2018-10-25 NOTE — Anesthesia Preprocedure Evaluation (Addendum)
Anesthesia Evaluation  Patient identified by MRN, date of birth, ID band Patient awake    Reviewed: Allergy & Precautions, NPO status , Patient's Chart, lab work & pertinent test results  Airway Mallampati: III  TM Distance: >3 FB Neck ROM: Full    Dental  (+) Poor Dentition,    Pulmonary sleep apnea (does not use CPAP) , COPD, Current Smoker,    breath sounds clear to auscultation       Cardiovascular hypertension,  Rhythm:Regular Rate:Normal  TTE 03/2018 - Left ventricle: The cavity size was normal. Systolic function was mildly reduced. The estimated ejection fraction was in the range of 45% to 50%. Hypokinesis of the basalinferolateral, inferior, and inferoseptal myocardium; consistent with ischemia in the  distribution of the right coronary artery; unchanged from the   previous study. Features are consistent with a pseudonormal left ventricular filling pattern, with concomitant abnormal relaxation and increased filling pressure (grade 2 diastolic dysfunction). - Pulmonary arteries: PA peak pressure: 31 mm Hg (S).  Stress 2015 LV Ejection Fraction: 47%. LV Wall Motion: Mild global LV hypokinesis.  Overall Impression: Low risk stress nuclear study . Findings suggest nonischemic cardiomyopathy.   Neuro/Psych PSYCHIATRIC DISORDERS Anxiety Depression Bipolar Disorder  Neuromuscular disease (diabetic neuropathy) CVA    GI/Hepatic Neg liver ROS, hiatal hernia, GERD  ,  Endo/Other  negative endocrine ROSdiabetes, Poorly Controlled, Type 2, Insulin Dependent  Renal/GU negative Renal ROS  negative genitourinary   Musculoskeletal  (+) Arthritis ,   Abdominal   Peds negative pediatric ROS (+)  Hematology negative hematology ROS (+)   Anesthesia Other Findings   Reproductive/Obstetrics negative OB ROS                            Anesthesia Physical Anesthesia Plan  ASA: III  Anesthesia Plan:  General   Post-op Pain Management:    Induction: Intravenous  PONV Risk Score and Plan: 2 and Ondansetron and Midazolam  Airway Management Planned: LMA and Oral ETT  Additional Equipment:   Intra-op Plan:   Post-operative Plan: Extubation in OR  Informed Consent: I have reviewed the patients History and Physical, chart, labs and discussed the procedure including the risks, benefits and alternatives for the proposed anesthesia with the patient or authorized representative who has indicated his/her understanding and acceptance.   Dental advisory given  Plan Discussed with: CRNA  Anesthesia Plan Comments:         Anesthesia Quick Evaluation

## 2018-10-25 NOTE — Transfer of Care (Signed)
Immediate Anesthesia Transfer of Care Note  Patient: [name]  Procedure(s) Performed: IRRIGATION AND DEBRIDEMENT GREAT TOE (Left Toe)  Patient Location: PACU  Anesthesia Type:General  Level of Consciousness: awake and alert   Airway & Oxygen Therapy: Patient Spontanous Breathing and Patient connected to nasal cannula oxygen  Post-op Assessment: Report given to RN and Post -op Vital signs reviewed and stable  Post vital signs: Reviewed and stable  Last Vitals:  Vitals Value Taken Time  BP 129/87 10/25/2018  6:54 PM  Temp    Pulse 92 10/25/2018  6:59 PM  Resp 14 10/25/2018  6:59 PM  SpO2 93 % 10/25/2018  6:59 PM  Vitals shown include unvalidated device data.  Last Pain:  Vitals:   10/25/18 1715  TempSrc:   PainSc: 5       Patients Stated Pain Goal: 5 (15/97/33 1250)  Complications: No apparent anesthesia complications

## 2018-10-25 NOTE — Anesthesia Postprocedure Evaluation (Signed)
Anesthesia Post Note  Patient: [name]  Procedure(s) Performed: IRRIGATION AND DEBRIDEMENT GREAT TOE (Left Toe)     Patient location during evaluation: PACU Anesthesia Type: General Level of consciousness: awake and alert Pain management: pain level controlled Vital Signs Assessment: post-procedure vital signs reviewed and stable Respiratory status: spontaneous breathing, nonlabored ventilation, respiratory function stable and patient connected to nasal cannula oxygen Cardiovascular status: blood pressure returned to baseline and stable Postop Assessment: no apparent nausea or vomiting Anesthetic complications: no    Last Vitals:  Vitals:   10/25/18 1915 10/25/18 1930  BP: 134/77 132/71  Pulse: 91 89  Resp: 15 (!) 7  Temp:    SpO2: 95% 99%    Last Pain:  Vitals:   10/25/18 1715  TempSrc:   PainSc: 5                  Lindsay Straka DAVID

## 2018-10-25 NOTE — Brief Op Note (Signed)
10/25/2018  6:42 PM  PATIENT:  [name]  52 y.o. female  PRE-OPERATIVE DIAGNOSIS:  great toe had been chewed off by her dog  POST-OPERATIVE DIAGNOSIS:  great toe had been chewed off by her dog  PROCEDURE:  Procedure(s): I excisional DEBRIDEMENT GREAT TOE region with leftray #1 amputation and placement of wound VAC  SURGEON:  Surgeon(s): Meredith Pel, MD  ASSISTANT: none  ANESTHESIA:   general  EBL: 50 ml    Total I/O In: -  Out: 200 [Urine:200]  BLOOD ADMINISTERED: none  DRAINS: Wound VAC   LOCAL MEDICATIONS USED:  none  SPECIMEN:  No Specimen  COUNTS:  YES  TOURNIQUET:  * No tourniquets in log *  DICTATION: .Other Dictation: Dictation Number [id number]  PLAN OF CARE: Admit to inpatient   PATIENT DISPOSITION:  PACU - hemodynamically stable

## 2018-10-25 NOTE — ED Notes (Signed)
Ortho Provider at bedside.

## 2018-10-25 NOTE — ED Notes (Signed)
Pt asleep. Medication held until pt can report pain level.

## 2018-10-25 NOTE — Progress Notes (Signed)
Per patient's request, attempted to contact patient's brother Marden Noble for post op update.  No answer.  Patient aware.

## 2018-10-25 NOTE — ED Notes (Signed)
Pt requesting different pain med Myrtle. Martie Round R\n made aware.

## 2018-10-26 ENCOUNTER — Encounter (HOSPITAL_COMMUNITY)
Admission: EM | Disposition: A | Discharge status: Skilled Nursing Facility | Payer: Self-pay | Source: Home / Self Care | Attending: Orthopedic Surgery

## 2018-10-26 ENCOUNTER — Inpatient Hospital Stay (HOSPITAL_COMMUNITY): Payer: Medicare Other | Admitting: Anesthesiology

## 2018-10-26 ENCOUNTER — Encounter (HOSPITAL_COMMUNITY): Payer: Self-pay | Admitting: Orthopedic Surgery

## 2018-10-26 DIAGNOSIS — L089 Local infection of the skin and subcutaneous tissue, unspecified: Secondary | ICD-10-CM

## 2018-10-26 DIAGNOSIS — W540XXS Bitten by dog, sequela: Secondary | ICD-10-CM

## 2018-10-26 DIAGNOSIS — S91359S Open bite, unspecified foot, sequela: Secondary | ICD-10-CM

## 2018-10-26 HISTORY — PX: AMPUTATION: SHX166

## 2018-10-26 HISTORY — PX: APPLICATION OF WOUND VAC: SHX5189

## 2018-10-26 LAB — GLUCOSE, CAPILLARY
GLUCOSE-CAPILLARY: 229 mg/dL — AB (ref 70–99)
GLUCOSE-CAPILLARY: 176 mg/dL — AB (ref 70–99)
GLUCOSE-CAPILLARY: 470 mg/dL — AB (ref 70–99)
Glucose-Capillary: 137 mg/dL — ABNORMAL HIGH (ref 70–99)
Glucose-Capillary: 227 mg/dL — ABNORMAL HIGH (ref 70–99)

## 2018-10-26 LAB — MRSA PCR SCREENING: MRSA BY PCR: NEGATIVE

## 2018-10-26 SURGERY — AMPUTATION, FOOT, RAY
Anesthesia: General | Site: Foot | Laterality: Left

## 2018-10-26 MED ORDER — ONDANSETRON HCL 4 MG/2ML IJ SOLN
INTRAMUSCULAR | Status: AC
  Filled 2018-10-26: qty 2

## 2018-10-26 MED ORDER — METHOCARBAMOL 1000 MG/10ML IJ SOLN
500.0000 mg | Freq: Four times a day (QID) | INTRAVENOUS | Status: DC | PRN
  Filled 2018-10-26: qty 5

## 2018-10-26 MED ORDER — BISACODYL 10 MG RE SUPP: 10.0000 mg | Freq: Every day | RECTAL | Status: DC | PRN

## 2018-10-26 MED ORDER — PHENYLEPHRINE 40 MCG/ML (10ML) SYRINGE FOR IV PUSH (FOR BLOOD PRESSURE SUPPORT)
PREFILLED_SYRINGE | INTRAVENOUS | Status: DC | PRN
  Administered 2018-10-26 (×5): 80 ug via INTRAVENOUS

## 2018-10-26 MED ORDER — HYDROMORPHONE HCL 1 MG/ML IJ SOLN
0.5000 mg | INTRAMUSCULAR | Status: DC | PRN
  Administered 2018-10-26 – 2018-10-31 (×5): 1 mg via INTRAVENOUS
  Filled 2018-10-26 (×5): qty 1

## 2018-10-26 MED ORDER — ONDANSETRON HCL 4 MG/2ML IJ SOLN: 4.0000 mg | Freq: Four times a day (QID) | INTRAMUSCULAR | Status: DC | PRN

## 2018-10-26 MED ORDER — SUGAMMADEX SODIUM 200 MG/2ML IV SOLN
INTRAVENOUS | Status: DC | PRN
  Administered 2018-10-26: 166.6 mg via INTRAVENOUS

## 2018-10-26 MED ORDER — ROCURONIUM BROMIDE 100 MG/10ML IV SOLN
INTRAVENOUS | Status: DC | PRN
  Administered 2018-10-26: 30 mg via INTRAVENOUS

## 2018-10-26 MED ORDER — METOCLOPRAMIDE HCL 5 MG PO TABS: 5.0000 mg | ORAL_TABLET | Freq: Three times a day (TID) | ORAL | Status: DC | PRN

## 2018-10-26 MED ORDER — LIDOCAINE HCL (CARDIAC) PF 100 MG/5ML IV SOSY
PREFILLED_SYRINGE | INTRAVENOUS | Status: DC | PRN
  Administered 2018-10-26: 80 mg via INTRATRACHEAL

## 2018-10-26 MED ORDER — ONDANSETRON HCL 4 MG/2ML IJ SOLN
INTRAMUSCULAR | Status: DC | PRN
  Administered 2018-10-26: 4 mg via INTRAVENOUS

## 2018-10-26 MED ORDER — POLYETHYLENE GLYCOL 3350 17 G PO PACK: 17.0000 g | PACK | Freq: Every day | ORAL | Status: DC | PRN

## 2018-10-26 MED ORDER — MIDAZOLAM HCL 2 MG/2ML IJ SOLN
INTRAMUSCULAR | Status: AC
  Filled 2018-10-26: qty 2

## 2018-10-26 MED ORDER — DOCUSATE SODIUM 100 MG PO CAPS: 100.0000 mg | ORAL_CAPSULE | Freq: Two times a day (BID) | ORAL | Status: DC

## 2018-10-26 MED ORDER — METOCLOPRAMIDE HCL 5 MG/ML IJ SOLN: 5.0000 mg | Freq: Three times a day (TID) | INTRAMUSCULAR | Status: DC | PRN

## 2018-10-26 MED ORDER — MAGNESIUM CITRATE PO SOLN: 1.0000 | Freq: Once | ORAL | Status: DC | PRN

## 2018-10-26 MED ORDER — PROPOFOL 10 MG/ML IV BOLUS
INTRAVENOUS | Status: DC | PRN
  Administered 2018-10-26: 150 mg via INTRAVENOUS

## 2018-10-26 MED ORDER — 0.9 % SODIUM CHLORIDE (POUR BTL) OPTIME
TOPICAL | Status: DC | PRN
  Administered 2018-10-26: 1000 mL

## 2018-10-26 MED ORDER — DEXAMETHASONE SODIUM PHOSPHATE 10 MG/ML IJ SOLN
INTRAMUSCULAR | Status: AC
  Filled 2018-10-26: qty 1

## 2018-10-26 MED ORDER — ACETAMINOPHEN 325 MG PO TABS: 325.0000 mg | ORAL_TABLET | Freq: Four times a day (QID) | ORAL | Status: DC | PRN

## 2018-10-26 MED ORDER — FENTANYL CITRATE (PF) 100 MCG/2ML IJ SOLN
INTRAMUSCULAR | Status: DC | PRN
  Administered 2018-10-26: 100 ug via INTRAVENOUS

## 2018-10-26 MED ORDER — FENTANYL CITRATE (PF) 250 MCG/5ML IJ SOLN
INTRAMUSCULAR | Status: AC
  Filled 2018-10-26: qty 5

## 2018-10-26 MED ORDER — LIDOCAINE 2% (20 MG/ML) 5 ML SYRINGE
INTRAMUSCULAR | Status: AC
  Filled 2018-10-26: qty 5

## 2018-10-26 MED ORDER — METHOCARBAMOL 500 MG PO TABS
500.0000 mg | ORAL_TABLET | Freq: Four times a day (QID) | ORAL | Status: DC | PRN
  Administered 2018-10-26 – 2018-10-30 (×6): 500 mg via ORAL
  Filled 2018-10-26 (×7): qty 1

## 2018-10-26 MED ORDER — OXYCODONE HCL 5 MG PO TABS
5.0000 mg | ORAL_TABLET | ORAL | Status: DC | PRN
  Administered 2018-10-27: 10 mg via ORAL
  Filled 2018-10-26: qty 2

## 2018-10-26 MED ORDER — PHENYLEPHRINE 40 MCG/ML (10ML) SYRINGE FOR IV PUSH (FOR BLOOD PRESSURE SUPPORT)
PREFILLED_SYRINGE | INTRAVENOUS | Status: AC
  Filled 2018-10-26: qty 20

## 2018-10-26 MED ORDER — ONDANSETRON HCL 4 MG PO TABS: 4.0000 mg | ORAL_TABLET | Freq: Four times a day (QID) | ORAL | Status: DC | PRN

## 2018-10-26 MED ORDER — DEXAMETHASONE SODIUM PHOSPHATE 10 MG/ML IJ SOLN
INTRAMUSCULAR | Status: DC | PRN
  Administered 2018-10-26: 10 mg via INTRAVENOUS

## 2018-10-26 MED ORDER — OXYCODONE HCL 5 MG PO TABS: 5.0000 mg | ORAL_TABLET | ORAL | Status: DC | PRN

## 2018-10-26 MED ORDER — PROPOFOL 10 MG/ML IV BOLUS
INTRAVENOUS | Status: AC
  Filled 2018-10-26: qty 20

## 2018-10-26 MED ORDER — OXYCODONE HCL 5 MG PO TABS
10.0000 mg | ORAL_TABLET | ORAL | Status: DC | PRN
  Administered 2018-10-26 – 2018-10-27 (×2): 15 mg via ORAL
  Administered 2018-10-27: 5 mg via ORAL
  Administered 2018-10-28 – 2018-10-31 (×10): 15 mg via ORAL
  Filled 2018-10-26 (×12): qty 3

## 2018-10-26 MED ORDER — MIDAZOLAM HCL 2 MG/2ML IJ SOLN
INTRAMUSCULAR | Status: DC | PRN
  Administered 2018-10-26: 2 mg via INTRAVENOUS

## 2018-10-26 MED ORDER — SODIUM CHLORIDE 0.9 % IV SOLN
INTRAVENOUS | Status: DC
  Administered 2018-10-26: 16:00:00 via INTRAVENOUS

## 2018-10-26 SURGICAL SUPPLY — 30 items
BLADE SURG 21 STRL SS (BLADE) ×3 IMPLANT
CANISTER WOUND CARE 500ML ATS (WOUND CARE) ×2 IMPLANT
COVER SURGICAL LIGHT HANDLE (MISCELLANEOUS) ×3 IMPLANT
COVER WAND RF STERILE (DRAPES) ×3 IMPLANT
DRAPE U-SHAPE 47X51 STRL (DRAPES) ×6 IMPLANT
DURAPREP 26ML APPLICATOR (WOUND CARE) ×3 IMPLANT
ELECT REM PT RETURN 9FT ADLT (ELECTROSURGICAL) ×3
ELECTRODE REM PT RTRN 9FT ADLT (ELECTROSURGICAL) ×1 IMPLANT
GLOVE BIOGEL PI IND STRL 6.5 (GLOVE) IMPLANT
GLOVE BIOGEL PI IND STRL 9 (GLOVE) ×1 IMPLANT
GLOVE BIOGEL PI INDICATOR 6.5 (GLOVE) ×2
GLOVE BIOGEL PI INDICATOR 9 (GLOVE) ×2
GLOVE SURG ORTHO 9.0 STRL STRW (GLOVE) ×3 IMPLANT
GLOVE SURG SS PI 6.5 STRL IVOR (GLOVE) ×2 IMPLANT
GLOVE SURG SS PI 8.0 STRL IVOR (GLOVE) ×4 IMPLANT
GOWN STRL REUS W/ TWL XL LVL3 (GOWN DISPOSABLE) ×2 IMPLANT
GOWN STRL REUS W/TWL XL LVL3 (GOWN DISPOSABLE) ×3
KIT BASIN OR (CUSTOM PROCEDURE TRAY) ×3 IMPLANT
KIT DRSG PREVENA PLUS 7DAY 125 (MISCELLANEOUS) ×3 IMPLANT
KIT PREVENA INCISION MGT 13 (CANNISTER) ×2 IMPLANT
KIT TURNOVER KIT B (KITS) ×3 IMPLANT
NS IRRIG 1000ML POUR BTL (IV SOLUTION) ×3 IMPLANT
PACK ORTHO EXTREMITY (CUSTOM PROCEDURE TRAY) ×3 IMPLANT
PAD ARMBOARD 7.5X6 YLW CONV (MISCELLANEOUS) ×4 IMPLANT
STOCKINETTE IMPERVIOUS LG (DRAPES) ×2 IMPLANT
SUT ETHILON 2 0 PSLX (SUTURE) ×5 IMPLANT
TOWEL GREEN STERILE (TOWEL DISPOSABLE) ×2 IMPLANT
TUBE CONNECTING 12'X1/4 (SUCTIONS) ×1
TUBE CONNECTING 12X1/4 (SUCTIONS) ×2 IMPLANT
YANKAUER SUCT BULB TIP NO VENT (SUCTIONS) ×3 IMPLANT

## 2018-10-26 NOTE — Anesthesia Procedure Notes (Signed)
Procedure Name: Intubation Date/Time: 10/26/2018 2:22 PM Performed by: Kathryne Hitch, CRNA Pre-anesthesia Checklist: Patient identified, Emergency Drugs available, Suction available and Patient being monitored Patient Re-evaluated:Patient Re-evaluated prior to induction Oxygen Delivery Method: Circle system utilized Preoxygenation: Pre-oxygenation with 100% oxygen Induction Type: IV induction and Cricoid Pressure applied Ventilation: Mask ventilation without difficulty Laryngoscope Size: Miller and 2 Grade View: Grade I Tube type: Oral Number of attempts: 1 Airway Equipment and Method: Patient positioned with wedge pillow and Oral airway Placement Confirmation: ETT inserted through vocal cords under direct vision,  positive ETCO2 and breath sounds checked- equal and bilateral Secured at: 22 cm Tube secured with: Tape Dental Injury: Teeth and Oropharynx as per pre-operative assessment

## 2018-10-26 NOTE — Plan of Care (Signed)
  Problem: Clinical Measurements: Goal: Will remain free from infection Outcome: Progressing   Problem: Activity: Goal: Risk for activity intolerance will decrease Outcome: Progressing   Problem: Pain Managment: Goal: General experience of comfort will improve Outcome: Progressing   Problem: Skin Integrity: Goal: Risk for impaired skin integrity will decrease Outcome: Progressing   

## 2018-10-26 NOTE — Transfer of Care (Signed)
Immediate Anesthesia Transfer of Care Note  Patient: [name]  Procedure(s) Performed: LEFT FOOT FIRST RAY AMPUTATION (Left Foot) APPLICATION OF WOUND VAC (Left Foot)  Patient Location: PACU  Anesthesia Type:General  Level of Consciousness: awake, alert , oriented and patient cooperative  Airway & Oxygen Therapy: Patient Spontanous Breathing and Patient connected to nasal cannula oxygen  Post-op Assessment: Report given to RN and Post -op Vital signs reviewed and stable  Post vital signs: Reviewed and stable  Last Vitals:  Vitals Value Taken Time  BP 97/63 10/26/2018  2:56 PM  Temp    Pulse 78 10/26/2018  3:00 PM  Resp 16 10/26/2018  3:00 PM  SpO2 91 % 10/26/2018  3:00 PM  Vitals shown include unvalidated device data.  Last Pain:  Vitals:   10/26/18 1342  TempSrc:   PainSc: 7       Patients Stated Pain Goal: 4 (44/62/86 3817)  Complications: No apparent anesthesia complications

## 2018-10-26 NOTE — Care Management Note (Signed)
Case Management Note  Patient Details  Name: [name] MRN: [id number] Date of Birth: Jul 26, 1966  Subjective/Objective:                    Action/Plan:  10/26/18 s/p  left foot first ray amputation and   Application of Praveena 13 cm wound VAC. Will await post op PT eval Expected Discharge Date:                  Expected Discharge Plan:     In-House Referral:     Discharge planning Services  CM Consult  Post Acute Care Choice:  Durable Medical Equipment, Home Health Choice offered to:     DME Arranged:    DME Agency:     HH Arranged:    HH Agency:     Status of Service:  In process, will continue to follow  If discussed at Long Length of Stay Meetings, dates discussed:    Additional Comments:  Marilu Favre, RN 10/26/2018, 3:52 PM

## 2018-10-26 NOTE — Progress Notes (Signed)
Pt admitted to room 6N22 from PACU s/p L great toe I&D with amputation and wound vac placement. Pt alert and oriented. Bed locked and in lowest position. Call bell within reach.

## 2018-10-26 NOTE — Op Note (Signed)
10/26/2018  2:58 PM  PATIENT:  [name]    PRE-OPERATIVE DIAGNOSIS:  abscess of left foot, with traumatic wound.  POST-OPERATIVE DIAGNOSIS:  Same  PROCEDURE:  LEFT FOOT FIRST RAY AMPUTATION, APPLICATION OF WOUND VAC, local tissue rearrangement for wound closure 8 x 4 cm.  Application of Praveena 13 cm wound VAC.  SURGEON:  Newt Minion, MD  PHYSICIAN ASSISTANT:None ANESTHESIA:   General  PREOPERATIVE INDICATIONS:  [name] is a  52 y.o. female with a diagnosis of abscess of left foot who failed conservative measures and elected for surgical management.    The risks benefits and alternatives were discussed with the patient preoperatively including but not limited to the risks of infection, bleeding, nerve injury, cardiopulmonary complications, the need for revision surgery, among others, and the patient was willing to proceed.  OPERATIVE IMPLANTS: Praveena wound VAC.  @ENCIMAGES @  OPERATIVE FINDINGS: No deep abscess tissue margins were resected back to healthy viable tissue.  OPERATIVE PROCEDURE: Patient was brought to the operating room underwent a general anesthetic.  After adequate levels of anesthesia were obtained patient's left lower extremity was prepped using DuraPrep draped into a sterile field a timeout was called.  An elliptical incision was made around the traumatic wound.  This left a wound that was 8 x 4 cm.  The first metatarsal was resected through the base of the first ray.  The remainder of the traumatic wound was resected with a 21 blade knife.  Electrocautery was used for hemostasis.  The local tissue review was rearranged with closure with 2-0 nylon with closing the wound 4 x 8 cm.  A Praveena wound VAC was applied this had a good suction fit.  Patient was extubated taken the PACU in stable condition.   DISCHARGE PLANNING:  Antibiotic duration: Continue IV antibiotics for 48 hours postoperatively.  Weightbearing: Nonweightbearing on the left.  Pain  medication: Prescription for opioid pathway.  Dressing care/ Wound VAC: Continue wound VAC for 1 week.  Ambulatory devices: no ambulatory devices patient does not have a prosthesis for the right leg  Discharge to: Anticipate discharge to skilled nursing.  Follow-up: In the office 1 week post operative.

## 2018-10-26 NOTE — Consult Note (Signed)
ORTHOPAEDIC CONSULTATION  REQUESTING PHYSICIAN: Meredith Pel, MD  Chief Complaint: Ulceration left foot  HPI: [name] is a 52 y.o. female who presents with amputation of the left great toe.  Patient states that her Doberman pinscher dog ate off her big toe.  She underwent initial irrigation and debridement with Dr. Marlou Sa a wound VAC was applied and patient presents at this time for revision of the amputation and cleansing of the tissue.  Past Medical History:  Diagnosis Date  . Anxiety   . Arthritis   . Bipolar disorder ([place])   . Broken jaw ([place])   . Broken wrist   . Chronic back pain   . Claudication ([place])    a. 12/2013 ABI's: R 0.97, L 0.94.  [name] COPD (chronic obstructive pulmonary disease) ([place])   . Depression   . Diabetic Charcot's foot ([place])   . Diabetic foot ulcer ([facility])    chronic left foot ulcer , great toe/  hx recurrent foot ulcer bilaterally  . DJD (degenerative joint disease)   . Edema of both lower extremities   . Episode of memory loss   . GERD (gastroesophageal reflux disease)   . Headache(784.0)   . Hiatal hernia   . History of colon cancer, stage I dx 02-26-2015--- oncologist-- dr [name]--- per last note no recurrence   05-16-2015 s/p  Laparoscopic sigmoid colectomy w/ node bx's (negative nodes per path)  Stage I (pT1,N0,M0) Grade 2  . 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   per MRI multiple small strokes post cardiac cath. --  residual mild memory loss  . History of methicillin resistant staphylococcus aureus (MRSA)   . Hypercholesterolemia   . Insomnia   . Insulin dependent type 2 diabetes mellitus, uncontrolled ([place]) dx 2004   endocrinologist-  dr Dwyane Dee-- last A1c 8.8 in Aug2018:  pt is noncompliant w/ diet, states does note eat breakfast , her first main meal in afternoon  . Neurogenic bladder   . Neuropathy, diabetic ([place])    hands and feet  . OSA (obstructive sleep apnea)    cpap intolerant  . Personality  disorder ([place])   . Restless leg syndrome   . SOB (shortness of breath) on exertion   . Stroke ([place])   . SUI (stress urinary incontinence, female) S/P SLING 12-29-2011   Past Surgical History:  Procedure Laterality Date  . AMPUTATION Right 04/13/2018   Procedure: RIGHT BELOW KNEE AMPUTATION;  Surgeon: Newt Minion, MD;  Location: [facility];  Service: Orthopedics;  Laterality: Right;  . ANTERIOR CERVICAL DECOMP/DISCECTOMY FUSION  2000   C5 - 7  . BACK SURGERY    . CARDIAC CATHETERIZATION  05-22-2008   DR SKAINS   NO SIGNIFECANT CAD/ NORMAL LV/  EF 65%/  NO WALL MOTION ABNORMALITIES  . CARPAL TUNNEL RELEASE Right 04-25-2013  . CATARACT EXTRACTION Right 10/24/2018  . [place]; 1992  . COLON SURGERY    . COLONOSCOPY    . CYSTO N/A 04/30/2013   Procedure: CYSTOSCOPY;  Surgeon: Reece Packer, MD;  Location: [facility];  Service: Urology;  Laterality: N/A;  . CYSTOSCOPY MACROPLASTIQUE IMPLANT N/A 02/06/2018   Procedure: CYSTOSCOPY MACROPLASTIQUE IMPLANT;  Surgeon: Bjorn Loser, MD;  Location: [facility];  Service: Urology;  Laterality: N/A;  . CYSTOSCOPY WITH INJECTION  05/04/2012   Procedure: CYSTOSCOPY WITH INJECTION;  Surgeon: Reece Packer, MD;  Location: [place];  Service: Urology;  Laterality: N/A;  MACROPLASTIQUE INJECTION  . CYSTOSCOPY WITH INJECTION  08/28/2012   Procedure: CYSTOSCOPY WITH INJECTION;  Surgeon: Reece Packer, MD;  Location[facility];  Service: Urology;  Laterality: N/A;  cysto and macroplastique   . FOOT SURGERY Bilateral    "related to [place]"  . HERNIA REPAIR  ?1996   "stomach"  . I&D EXTREMITY Left 10/25/2018   Procedure: IRRIGATION AND DEBRIDEMENT GREAT TOE;  Surgeon: Meredith Pel, MD;  Location: [place];  Service: Orthopedics;  Laterality: Left;  . INCISION AND DRAINAGE Left 10/25/2018   GREAT TOE  . INCISION AND DRAINAGE OF WOUND Right 04/11/2018   Procedure: IRRIGATION AND  DEBRIDEMENT WOUND right foot and right ankle;  Surgeon: Evelina Bucy, DPM;  Location: [facility];  Service: Podiatry;  Laterality: Right;  . KNEE ARTHROSCOPY Left   . KNEE ARTHROSCOPY W/ ALLOGRAFT IMPANT Left    graft x 2  . KNEE SURGERY     TOTAL 8 SURG'S  . LAPAROSCOPIC SIGMOID COLECTOMY N/A 04/22/2015   Procedure: LAPAROSCOPIC HAND ASSISTED SIGMOID COLECTOMY;  Surgeon: Erroll Luna, MD;  Location: [facility];  Service: General;  Laterality: N/A;  . LEFT HEART CATHETERIZATION WITH CORONARY ANGIOGRAM N/A 02/12/2014   Procedure: LEFT HEART CATHETERIZATION WITH CORONARY ANGIOGRAM;  Surgeon: Candee Furbish, MD;  Location: [facility];  Service: Cardiovascular;  Laterality: N/A;   No angiographically significant CAD; normal LVSF, LVEDP 35mmHg,  EF 55% (new finding ef 30% myoview 01-08-2014)  . LUMBAR FUSION    . MANDIBLE FRACTURE SURGERY    . MULTIPLE LAPAROSCOPIES FOR ENDOMETRIOSIS    . PUBOVAGINAL SLING  12/29/2011   Procedure: Gaynelle Arabian;  Surgeon: Reece Packer, MD;  Location: [facility];  Service: Urology;  Laterality: N/A;  cysto and sparc sling   . PUBOVAGINAL SLING N/A 04/30/2013   Procedure: REMOVAL OF VAGINAL MESH;  Surgeon: Reece Packer, MD;  Location[facility];  Service: Urology;  Laterality: N/A;  . RECONSTURCTION OF CONGENITAL UTERUS ANOMALY  1983  . REPEAT RECONSTRUCTION ACL LEFT KNEE/ SCREWS REMOVED  03-28-2000   CADAVER GRAFT  . TOTAL ABDOMINAL HYSTERECTOMY  1997   w/BSO  . TRANSTHORACIC ECHOCARDIOGRAM  02/13/2014   ef 45%, hypokinesis base inferior and base inferolateral walls  . UPPER GI ENDOSCOPY    . WOUND DEBRIDEMENT Right 09/05/2016   Procedure: DEBRIDEMENT WOUND WITH GRAFT RIGHT FOOT;  Surgeon: Landis Martins, DPM;  Location: [place];  Service: Podiatry;  Laterality: Right;  . WRIST FRACTURE SURGERY     Social History   Socioeconomic History  . Marital status: Married    Spouse name: Not on file  . Number of children: 2  . Years of education:  6th  . Highest education level: Not on file  Occupational History  . Not on file  Social Needs  . Financial resource strain: Not on file  . Food insecurity:    Worry: Not on file    Inability: Not on file  . Transportation needs:    Medical: Not on file    Non-medical: Not on file  Tobacco Use  . Smoking status: Current Every Day Smoker    Packs/day: 2.00    Years: 46.00    Pack years: 92.00    Types: Cigarettes  . Smokeless tobacco: Never Used  . Tobacco comment: per pt started smoking  age 37  Substance and Sexual Activity  . Alcohol use: No    Comment: 05/24/2018 "special occasions only"  .  Drug use: Not Currently  . Sexual activity: Yes  Lifestyle  . Physical activity:    Days per week: Not on file    Minutes per session: Not on file  . Stress: Not on file  Relationships  . Social connections:    Talks on phone: Not on file    Gets together: Not on file    Attends religious service: Not on file    Active member of club or organization: Not on file    Attends meetings of clubs or organizations: Not on file    Relationship status: Not on file  Other Topics Concern  . Not on file  Social History Narrative   Patient is divorced and lives alone- independent in ADLs, Drives   Patient has two adult children- grown son and daughter who is special needs in a home   Patient is disabled since 49   Patient has a 6th grade education.   Patient is right-handed.   Depression-medication and therapist   Patient drinks 2- 3 liters of soda and drinks tea but not everyday.    Does not routinely exercise.      Patient reports that she drinks "a lot" of caffeine daily    Family History  Problem Relation Age of Onset  . Hypertension Mother   . Diabetes Mother   . Cancer - Other Mother        lymphoma   . Cancer - Other Father        lung, bladder cancer   . Heart attack Father   . Cancer - Other Brother        bladder cancer    - negative except otherwise stated in the  family history section Allergies  Allergen Reactions  . Latex Itching, Rash and Other (See Comments)    Pt states she cannot use condoms - cause an infection.  Use of latex on skin is okay for short period of time.  Tape causes rash  . Sweetening Enhancer [Flavoring Agent] Nausea And Vomiting and Other (See Comments)    HEADACHES  . Aspartame And Phenylalanine Nausea And Vomiting    HEADACHES  . Ibuprofen Other (See Comments)    HEADACHES  . Trazodone And Nefazodone Other (See Comments)    Hallucinations   . Triazolam Other (See Comments)    HALLUCINATIONS   Prior to Admission medications   Medication Sig Start Date End Date Taking? Authorizing Provider  aspirin-acetaminophen-caffeine (EXCEDRIN MIGRAINE) [phone] MG tablet Take 2 tablets by mouth every 6 (six) hours as needed for headache.   Yes [provider]  clonazePAM (KLONOPIN) 1 MG tablet Take 1 mg by mouth 2 (two) times daily.  06/22/18  Yes [provider]  cyclobenzaprine (FLEXERIL) 10 MG tablet Take 10 mg by mouth 3 (three) times daily as needed for muscle spasms.  05/18/18  Yes [provider]  diazepam (VALIUM) 5 MG tablet Take 5 mg by mouth 2 (two) times daily.  10/07/18  Yes [provider]  doxycycline (VIBRA-TABS) 100 MG tablet Take 1 tablet (100 mg total) by mouth 2 (two) times daily. 09/17/18  Yes Newt Minion, MD  DUREZOL 0.05 % EMUL Place 1 drop into the right eye 2 (two) times daily. 10/12/18  Yes [provider]  empagliflozin (JARDIANCE) 25 MG TABS tablet Take 25 mg by mouth daily. 10/22/18  Yes Shamleffer, Melanie Crazier, MD  esomeprazole (NEXIUM) 40 MG capsule Take 40 mg by mouth daily.  10/07/18  Yes [provider]  gabapentin (NEURONTIN) 600 MG tablet Take 1 tablet (600 mg total) by mouth 2 (two) times daily. Take in the morning and afternoon. Do not take at night time. Patient taking differently: Take 1,200 mg by mouth at bedtime.  05/10/18  Yes Mariel Aloe, MD  HETLIOZ 20 MG CAPS Take 20 mg by mouth at bedtime. 05/02/18  Yes [provider]  INGREZZA 80 MG CAPS Take 80 mg by mouth at bedtime. 05/01/18  Yes [provider]  insulin aspart (NOVOLOG FLEXPEN) 100 UNIT/ML FlexPen Inject 6 units with each meal daily and use sliding scale for further dosage 10/16/18  Yes Elayne Snare, MD  Insulin Glargine (BASAGLAR KWIKPEN) 100 UNIT/ML SOPN Inject 0.22 mLs (22 Units total) into the skin daily. 07/05/18  Yes Lorella Nimrod, MD  nortriptyline (PAMELOR) 10 MG capsule Take 10 mg by mouth at bedtime. 05/17/18  Yes [provider]  ofloxacin (OCUFLOX) 0.3 % ophthalmic solution Place 1 drop into the right eye 2 (two) times daily. 10/12/18  Yes [provider]  oxyCODONE (OXY IR/ROXICODONE) 5 MG immediate release tablet Take 5 mg by mouth 3 (three) times daily as needed.    Yes [provider]  QUEtiapine (SEROQUEL) 300 MG tablet Take 600 mg by mouth at bedtime. One hour before bed 06/12/18  Yes [provider]  rOPINIRole (REQUIP) 2 MG tablet Take 4 mg by mouth 3 (three) times daily. 10/16/18  Yes [provider]  ropinirole (REQUIP) 5 MG tablet Take 1 tablet (5 mg total) by mouth 3 (three) times daily. 10/09/18  Yes Stover, Titorya, DPM  VRAYLAR 6 MG CAPS Take 6 mg by mouth at bedtime.  05/31/18  Yes [provider]  Armodafinil 250 MG tablet Take 250 mg by mouth daily.     [provider]  bisacodyl (DULCOLAX) 5 MG EC tablet Take 1 tablet (5 mg total) by mouth daily as needed for moderate constipation. Patient not taking: Reported on 10/25/2018 04/16/18   Elgergawy, Silver Huguenin, MD  glucose blood (FREESTYLE TEST STRIPS) test strip USE TO CHECK BLOOD SUGARS DAILY. DX CODE E11.65 08/09/18   Elayne Snare, MD  lidocaine (LIDODERM) 5 % Place 1 patch onto the skin daily as needed (pain). Remove & Discard patch within 12 hours or as directed by MD Patient not taking: Reported on 10/25/2018 02/27/18    Landis Martins, DPM  mineral oil-hydrophilic petrolatum (AQUAPHOR) ointment Apply topically as needed for dry skin. Patient not taking: Reported on 10/25/2018 05/29/18   Landis Martins, DPM  ondansetron (ZOFRAN) 4 MG tablet Take 1 tablet (4 mg total) by mouth every 6 (six) hours as needed for nausea. Patient not taking: Reported on 10/25/2018 04/16/18   Elgergawy, Silver Huguenin, MD  oxyCODONE-acetaminophen (PERCOCET) 10-325 MG tablet Take 1 tablet by mouth every 8 (eight) hours as needed for pain. Patient not taking: Reported on 10/25/2018 05/03/18   Newt Minion, MD  oxyCODONE-acetaminophen (PERCOCET) 10-325 MG tablet Take 1 tablet by mouth every 4 (four) hours as needed for pain. Patient not taking: Reported on 10/25/2018 09/17/18   Newt Minion, MD  pantoprazole (PROTONIX) 40 MG tablet Take 1 tablet (40 mg total) by mouth daily. Patient not taking: Reported on 10/25/2018 04/27/18   Love, Ivan Anchors, PA-C  tamsulosin (FLOMAX) 0.4 MG CAPS capsule Take 0.4 mg by mouth daily. 05/17/18   [provider]   Dg Chest Portable 1 View  Result Date: 10/25/2018 CLINICAL DATA:  Preop for left great toe  surgery. History of COPD and dyspnea on exertion. Current smoker. EXAM: PORTABLE CHEST 1 VIEW COMPARISON:  07/03/2018 FINDINGS: Heart, mediastinum and hila are within normal limits. Clear lungs.  No pleural effusion or pneumothorax. Status post previous anterior cervical spine fusion. Skeletal structures are grossly intact. IMPRESSION: No active disease. Electronically Signed   By: Lajean Manes M.D.   On: 10/25/2018 07:48   Dg Foot Complete Left  Result Date: 10/25/2018 CLINICAL DATA:  Pt's dog ate pt's 1st toe on Lt foot completely off while she slept last night, initial encounter EXAM: LEFT FOOT - COMPLETE 3+ VIEW COMPARISON:  None. FINDINGS: There is a bony defect in the first toe with loss of the phalanges as well as a portion of the first metatarsal head consistent with the patient's given clinical  history. Considerable soft tissue defect is noted consistent with the given clinical history as well. No radiopaque foreign body is seen. No definitive erosive changes in the bone are noted. Degenerative changes in the tarsal bones are seen. Calcaneal spurring is noted. IMPRESSION: Changes consistent with the given clinical history of dog eating first toe. The bony defect extends into the head of the first metatarsal. Electronically Signed   By: Inez Catalina M.D.   On: 10/25/2018 07:21   - pertinent xrays, CT, MRI studies were reviewed and independently interpreted  Positive ROS: All other systems have been reviewed and were otherwise negative with the exception of those mentioned in the HPI and as above.  Physical Exam: General: Alert, no acute distress Psychiatric: Patient is competent for consent with normal mood and affect Lymphatic: No axillary or cervical lymphadenopathy Cardiovascular: No pedal edema Respiratory: No cyanosis, no use of accessory musculature GI: No organomegaly, abdomen is soft and non-tender    Images:  @ENCIMAGES @  Labs:  Lab Results  Component Value Date   HGBA1C 10.6 (H) 07/03/2018   HGBA1C 9.2 03/19/2018   HGBA1C 10.0 12/11/2017   ESRSEDRATE 55 (H) 02/19/2016   CRP 17.4 (H) 02/19/2016   LABURIC 5.8 02/19/2016   REPTSTATUS 07/04/2018 FINAL 07/03/2018   GRAMSTAIN  04/11/2018    RARE WBC PRESENT, PREDOMINANTLY PMN NO ORGANISMS SEEN    CULT  07/03/2018    NO GROWTH Performed at [facility], [place] [address] [place], [place] [address]    LABORGA ESCHERICHIA COLI (A) 05/24/2018    Lab Results  Component Value Date   ALBUMIN 3.3 (L) 07/03/2018   ALBUMIN 3.3 (L) 06/28/2018   ALBUMIN 3.8 05/22/2018   LABURIC 5.8 02/19/2016    Neurologic: Patient does not have protective sensation bilateral lower extremities.   MUSCULOSKELETAL:   Skin: Examination patient has a open wound over the medial aspect of the left foot.  There is no ascending  cellulitis.  Patient has an albumin of Of 3.3 and a hemoglobin A1c of 10.6.  Assessment: Assessment: Diabetic insensate neuropathy with mild protein caloric malnutrition status post amputation of left great toe from dog bite.  Plan: Plan: We will plan for revision of the first ray amputation local tissue rearrangement for wound closure and placement of a wound VAC.  Risks and benefits were discussed including risk of the wound not healing.  Patient states she understands wished to proceed at this time.  Thank you for the consult and the opportunity to see Ms. Herma Ard, MD [place] [phone] 2:02 PM

## 2018-10-26 NOTE — Progress Notes (Signed)
Pt sent to OR via bed.

## 2018-10-26 NOTE — Progress Notes (Signed)
Inpatient Diabetes Program Recommendations  AACE/ADA: New Consensus Statement on Inpatient Glycemic Control (2015)  Target Ranges:  Prepandial:   less than 140 mg/dL      Peak postprandial:   less than 180 mg/dL (1-2 hours)      Critically ill patients:  140 - 180 mg/dL   Lab Results  Component Value Date   GLUCAP 229 (H) 10/26/2018   HGBA1C 10.6 (H) 07/03/2018    Review of Glycemic ControlResults for [name], [name] (MRN [id number]) as of 10/26/2018 11:51  Ref. Range 10/25/2018 14:53 10/25/2018 19:01 10/25/2018 23:25 10/26/2018 08:35  Glucose-Capillary Latest Ref Range: 70 - 99 mg/dL 198 (H) 163 (H) 249 (H) 229 (H)   Diabetes history: DM2 Outpatient Diabetes medications:  Jardiance 25 mg daily, Novolog 6 units tid with meals, Basaglar 22 units daily Current orders:  Lantus 22 units daily, Invokana 100 mg daily Inpatient Diabetes Program Recommendations:   Please add Novolog sensitive q 4 hours while NPO. Also please consider d/c of Invokana.  Add A1C to current labs. Thanks,  Adah Perl, RN, BC-ADM Inpatient Diabetes Coordinator Pager [phone] (8a-5p)

## 2018-10-26 NOTE — Progress Notes (Signed)
Pt received back from PACU, wound VAC at 125 in place.

## 2018-10-26 NOTE — Op Note (Signed)
NAME: [name], [name] MEDICAL RECORD OI:[id number] ACCOUNT [id number] DATE OF BIRTH:01-Mar-1966 FACILITY: [facility] LOCATION: [facility] PHYSICIAN:GREGORY Randel Pigg, MD  OPERATIVE REPORT  DATE OF PROCEDURE:  10/25/2018  PREOPERATIVE DIAGNOSIS:  Left great toe open amputation.  POSTOPERATIVE DIAGNOSIS:  Left great toe open amputation.  PROCEDURE:  Left great toe excisional debridement of skin, subcutaneous tissue, muscle, fascia and first ray amputation with placement of wound vacuum assisted closure device.  SURGEON:  Meredith Pel, MD  ASSISTANT:  None.  ANESTHESIA:  General.  INDICATIONS:  [name] is a 52 year old patient who had her first toe including the metatarsal head, bitten off by her dog last night.  She presents now for operative management after explanation of risks and benefits.  She has received antibiotics in the  emergency room.  PROCEDURE IN DETAIL:  The patient brought to the operating room where general endotracheal anesthesia was induced.  Preoperative antibiotics administered.  Timeout was called.  Left foot was prepped with a ChloraPrep solution and draped in a sterile  manner.  Time-out was called at this time.  The patient had no great toe and metatarsal head had also been chewed away.  There was exposed but viable muscle, skin, subcutaneous tissue and fascia.  Using sharp dissection with a knife, this was debrided  back to essentially remove any soft tissue that had been exposed to the dog.  Using an oscillating saw and a bevelled cut, the first ray was also removed after skeletonization of the first metatarsal.  This created more space for later closure.   Following extensive soft tissue debridement, the wound was irrigated.  This was done with 3 liters of irrigating solution.  It was then closed over a wound VAC.  PLAN:  At this time is for return to the operating room on Saturday for delayed primary closure and repeat irrigation and debridement.  The patient also  has potentially an issue with left small toe.  That can also be addressed at the time of repeat  surgery on Friday versus Saturday.  AN/NUANCE  D:10/25/2018 T:10/26/2018 JOB:[id number]

## 2018-10-26 NOTE — Anesthesia Preprocedure Evaluation (Signed)
Anesthesia Evaluation  Patient identified by MRN, date of birth, ID band Patient awake    Reviewed: Allergy & Precautions, NPO status , Patient's Chart, lab work & pertinent test results  Airway Mallampati: III  TM Distance: >3 FB Neck ROM: Full    Dental  (+) Poor Dentition,    Pulmonary sleep apnea (does not use CPAP) , COPD, Current Smoker,    breath sounds clear to auscultation       Cardiovascular hypertension,  Rhythm:Regular Rate:Normal  TTE 03/2018 - Left ventricle: The cavity size was normal. Systolic function was mildly reduced. The estimated ejection fraction was in the range of 45% to 50%. Hypokinesis of the basalinferolateral, inferior, and inferoseptal myocardium; consistent with ischemia in the  distribution of the right coronary artery; unchanged from the   previous study. Features are consistent with a pseudonormal left ventricular filling pattern, with concomitant abnormal relaxation and increased filling pressure (grade 2 diastolic dysfunction). - Pulmonary arteries: PA peak pressure: 31 mm Hg (S).  Stress 2015 LV Ejection Fraction: 47%. LV Wall Motion: Mild global LV hypokinesis.  Overall Impression: Low risk stress nuclear study . Findings suggest nonischemic cardiomyopathy.   Neuro/Psych PSYCHIATRIC DISORDERS Anxiety Depression Bipolar Disorder  Neuromuscular disease (diabetic neuropathy) CVA    GI/Hepatic Neg liver ROS, hiatal hernia, GERD  ,  Endo/Other  negative endocrine ROSdiabetes, Poorly Controlled, Type 2, Insulin Dependent  Renal/GU negative Renal ROS  negative genitourinary   Musculoskeletal  (+) Arthritis ,   Abdominal   Peds negative pediatric ROS (+)  Hematology negative hematology ROS (+)   Anesthesia Other Findings   Reproductive/Obstetrics negative OB ROS                             Anesthesia Physical  Anesthesia Plan  ASA: III  Anesthesia Plan:  General   Post-op Pain Management:    Induction: Intravenous  PONV Risk Score and Plan: 2 and Ondansetron and Midazolam  Airway Management Planned: Oral ETT  Additional Equipment:   Intra-op Plan:   Post-operative Plan: Extubation in OR  Informed Consent: I have reviewed the patients History and Physical, chart, labs and discussed the procedure including the risks, benefits and alternatives for the proposed anesthesia with the patient or authorized representative who has indicated his/her understanding and acceptance.   Dental advisory given  Plan Discussed with: CRNA  Anesthesia Plan Comments:         Anesthesia Quick Evaluation

## 2018-10-26 NOTE — Anesthesia Procedure Notes (Signed)
Performed by: Jiraiya Mcewan S, CRNA       

## 2018-10-27 LAB — GLUCOSE, CAPILLARY
GLUCOSE-CAPILLARY: 390 mg/dL — AB (ref 70–99)
Glucose-Capillary: 124 mg/dL — ABNORMAL HIGH (ref 70–99)
Glucose-Capillary: 158 mg/dL — ABNORMAL HIGH (ref 70–99)
Glucose-Capillary: 181 mg/dL — ABNORMAL HIGH (ref 70–99)
Glucose-Capillary: 402 mg/dL — ABNORMAL HIGH (ref 70–99)

## 2018-10-27 MED ORDER — INSULIN ASPART 100 UNIT/ML ~~LOC~~ SOLN
0.0000 [IU] | Freq: Three times a day (TID) | SUBCUTANEOUS | Status: DC
  Administered 2018-10-27: 2 [IU] via SUBCUTANEOUS
  Administered 2018-10-27: 3 [IU] via SUBCUTANEOUS
  Administered 2018-10-27: 15 [IU] via SUBCUTANEOUS
  Administered 2018-10-28 (×2): 3 [IU] via SUBCUTANEOUS
  Administered 2018-10-29: 7 [IU] via SUBCUTANEOUS
  Administered 2018-10-29 – 2018-10-30 (×4): 3 [IU] via SUBCUTANEOUS
  Administered 2018-10-30: 5 [IU] via SUBCUTANEOUS
  Administered 2018-10-31: 3 [IU] via SUBCUTANEOUS
  Administered 2018-10-31: 5 [IU] via SUBCUTANEOUS

## 2018-10-27 MED ORDER — NICOTINE 21 MG/24HR TD PT24
21.0000 mg | MEDICATED_PATCH | Freq: Every day | TRANSDERMAL | Status: DC
  Administered 2018-10-27 – 2018-10-31 (×5): 21 mg via TRANSDERMAL
  Filled 2018-10-27 (×5): qty 1

## 2018-10-27 MED ORDER — INSULIN ASPART 100 UNIT/ML ~~LOC~~ SOLN
4.0000 [IU] | Freq: Three times a day (TID) | SUBCUTANEOUS | Status: DC
  Administered 2018-10-27 – 2018-10-31 (×13): 4 [IU] via SUBCUTANEOUS

## 2018-10-27 NOTE — Progress Notes (Signed)
CBG's in the 400's, no SSI coverage. MD on call notified. See new orders.

## 2018-10-27 NOTE — Evaluation (Signed)
Physical Therapy Evaluation Patient Details Name: [name] MRN: [id number] DOB: [birth year] Today's Date: 10/27/2018   History of Present Illness  52 yo female with onset of traumatic amputation to L great toe was admitted for surgical debridement of abscess and cleaning of the wound site.  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  PMHx:  COPD, bipolar disorder, wrist fracture, jaw fracture, LBP, DJD, memory loss, Charcot's foot, colon ca, HA, hiatal hernia, depression,   Clinical Impression  Pt is up to side of bed and talked with her about her insistence on using LLE to stand and pivot to [facility].  Nursing will obtain a drop arm BSC to ease the transition to the chair without breaching orders.  Pt is willing to attempt but will try to use LLE again unless re-emphasized with her about healing with difficult wounds.    Follow Up Recommendations SNF    Equipment Recommendations  None recommended by PT    Recommendations for Other Services       Precautions / Restrictions Precautions Precautions: Fall(telemetry) Precaution Comments: wound vac on L foot Restrictions Weight Bearing Restrictions: Yes RLE Weight Bearing: Non weight bearing LLE Weight Bearing: Non weight bearing Other Position/Activity Restrictions: non healed wounds on B LE's      Mobility  Bed Mobility Overal bed mobility: Needs Assistance Bed Mobility: Supine to Sit;Sit to Supine     Supine to sit: Min guard Sit to supine: Min guard   General bed mobility comments: pt is impulsive and wants to be able to move without assistance due to her previous high functional level  Transfers Overall transfer level: Needs assistance Equipment used: None Transfers: Lateral/Scoot Transfers;Anterior-Posterior Wellsite geologist transfers: Min guard  Lateral/Scoot Transfers: Agricultural consultant transfer comment: pt is  able to move without using LE's but does not see why she should not use LLE  Ambulation/Gait       Gait Pattern/deviations: (unable due to WB limitation)        Stairs            Wheelchair Mobility    Modified Rankin (Stroke Patients Only)       Balance Overall balance assessment: Needs assistance Sitting-balance support: Bilateral upper extremity supported Sitting balance-Leahy Scale: Fair                                       Pertinent Vitals/Pain Pain Assessment: Faces Faces Pain Scale: Hurts a little bit Pain Location: L foot  Pain Descriptors / Indicators: Operative site guarding    Home Living Family/patient expects to be discharged to:: Unsure Living Arrangements: Alone               Additional Comments: ramped entrance to home    Prior Function Level of Independence: Independent         Comments: Per pt she was transferring independently before her surgery     Hand Dominance   Dominant Hand: Right    Extremity/Trunk Assessment   Upper Extremity Assessment Upper Extremity Assessment: Generalized weakness    Lower Extremity Assessment Lower Extremity Assessment: Generalized weakness;RLE deficits/detail;LLE deficits/detail RLE Deficits / Details: BK amputation with incomplete healing LLE Deficits / Details: L great toe amputation LLE Coordination: decreased fine motor;decreased gross motor  Cervical / Trunk Assessment Cervical / Trunk Assessment: Kyphotic  Communication   Communication: No difficulties  Cognition Arousal/Alertness: Awake/alert Behavior During Therapy: Impulsive Overall Cognitive Status: No family/caregiver present to determine baseline cognitive functioning                                 General Comments: pt is talking about WB when she has been restricted for this by MD.  Continues to try WB with PT repeatedly telling her not to do so      General Comments General comments  (skin integrity, edema, etc.): wound is covered in vac with clean bandaging and no signs of leakage    Exercises     Assessment/Plan    PT Assessment Patient needs continued PT services  PT Problem List Decreased strength;Decreased range of motion;Decreased activity tolerance;Decreased balance;Decreased mobility;Decreased coordination;Decreased cognition;Decreased knowledge of use of DME;Decreased safety awareness;Decreased skin integrity;Pain       PT Treatment Interventions DME instruction;Gait training;Functional mobility training;Therapeutic activities;Balance training;Therapeutic exercise;Neuromuscular re-education;Patient/family education    PT Goals (Current goals can be found in the Care Plan section)  Acute Rehab PT Goals Patient Stated Goal: to get home safely PT Goal Formulation: With patient Time For Goal Achievement: 11/10/18 Potential to Achieve Goals: Good    Frequency Min 2X/week   Barriers to discharge Decreased caregiver support home alone but is ramped to enter house    Co-evaluation               AM-PAC PT "6 Clicks" Daily Activity  Outcome Measure Difficulty turning over in bed (including adjusting bedclothes, sheets and blankets)?: A Little Difficulty moving from lying on back to sitting on the side of the bed? : Unable Difficulty sitting down on and standing up from a chair with arms (e.g., wheelchair, bedside commode, etc,.)?: Unable Help needed moving to and from a bed to chair (including a wheelchair)?: A Little Help needed walking in hospital room?: Total Help needed climbing 3-5 steps with a railing? : Total 6 Click Score: 10    End of Session   Activity Tolerance: Patient tolerated treatment well;Patient limited by fatigue;Other (comment)(struggles to use UE's only to scoot laterally) Patient left: in bed;with call bell/phone within reach;with bed alarm set Nurse Communication: Mobility status PT Visit Diagnosis: Muscle weakness  (generalized) (M62.81);Other abnormalities of gait and mobility (R26.89);Pain Pain - Right/Left: Left Pain - part of body: Ankle and joints of foot    Time: 7517-0017 PT Time Calculation (min) (ACUTE ONLY): 25 min   Charges:   PT Evaluation $PT Eval Moderate Complexity: 1 Mod PT Treatments $Therapeutic Activity: 8-22 mins        Ramond Dial 10/27/2018, 4:21 PM   Mee Hives, PT MS Acute Rehab Dept. Number: [place] and [place]

## 2018-10-27 NOTE — Progress Notes (Signed)
Patient ID: [name], female   DOB: [birth year], 52 y.o.   MRN: [id number] Patient is postoperative day 1 status post revision of the first ray amputation left foot with local tissue rearrangement and application of a Praveena wound VAC.  There is currently 100 cc in the drainage canister.  Patient will need to follow-up in my office this next week the portable Praveena wound VAC pump will need to be used at discharge she should be nonweightbearing on the left lower extremity.  Patient may require discharge to skilled nursing she does have a transtibial amputation on the right.  Patient requests oxycodone 15 mg at the time of discharge.  She states that this is the standard level of pain medicine that she takes.

## 2018-10-28 LAB — GLUCOSE, CAPILLARY
GLUCOSE-CAPILLARY: 184 mg/dL — AB (ref 70–99)
GLUCOSE-CAPILLARY: 197 mg/dL — AB (ref 70–99)
GLUCOSE-CAPILLARY: 192 mg/dL — AB (ref 70–99)
Glucose-Capillary: 159 mg/dL — ABNORMAL HIGH (ref 70–99)

## 2018-10-28 NOTE — Plan of Care (Signed)
  Problem: Education: Goal: Knowledge of General Education information will improve Description: Including pain rating scale, medication(s)/side effects and non-pharmacologic comfort measures Outcome: Progressing   Problem: Health Behavior/Discharge Planning: Goal: Ability to manage health-related needs will improve Outcome: Progressing   Problem: Clinical Measurements: Goal: Ability to maintain clinical measurements within normal limits will improve Outcome: Progressing Goal: Will remain free from infection Outcome: Progressing Goal: Diagnostic test results will improve Outcome: Progressing   Problem: Activity: Goal: Risk for activity intolerance will decrease Outcome: Progressing   Problem: Nutrition: Goal: Adequate nutrition will be maintained Outcome: Progressing   Problem: Elimination: Goal: Will not experience complications related to bowel motility Outcome: Progressing Goal: Will not experience complications related to urinary retention Outcome: Progressing   Problem: Pain Managment: Goal: General experience of comfort will improve Outcome: Progressing   Problem: Safety: Goal: Ability to remain free from injury will improve Outcome: Progressing   Problem: Skin Integrity: Goal: Risk for impaired skin integrity will decrease Outcome: Progressing   

## 2018-10-28 NOTE — Progress Notes (Signed)
Patient ID: [name], female   DOB: May 10, 1966, 52 y.o.   MRN: [id number] Patient is postoperative day 2 revision first ray amputation status post dog bite.  Patient states she does not like her diet she is currently drinking Coke, she has been smoking in her room.  Again reviewed the importance of proper nutrition, and smoking cessation.  Anticipate patient will need discharge to skilled nursing with the incisional wound VAC in place.  There is currently approximately 150 cc in the wound VAC canister.

## 2018-10-29 ENCOUNTER — Encounter (HOSPITAL_COMMUNITY): Payer: Self-pay | Admitting: Orthopedic Surgery

## 2018-10-29 LAB — GLUCOSE, CAPILLARY
GLUCOSE-CAPILLARY: 179 mg/dL — AB (ref 70–99)
Glucose-Capillary: 180 mg/dL — ABNORMAL HIGH (ref 70–99)
Glucose-Capillary: 200 mg/dL — ABNORMAL HIGH (ref 70–99)
Glucose-Capillary: 161 mg/dL — ABNORMAL HIGH (ref 70–99)

## 2018-10-29 MED ORDER — OXYCODONE HCL 5 MG PO TABS: 5.0000 mg | ORAL_TABLET | Freq: Four times a day (QID) | ORAL | 0 refills | Status: DC | PRN

## 2018-10-29 MED ORDER — OXYCODONE-ACETAMINOPHEN 10-325 MG PO TABS: 1.0000 | ORAL_TABLET | Freq: Four times a day (QID) | ORAL | 0 refills | Status: DC | PRN

## 2018-10-29 NOTE — Plan of Care (Signed)
  Problem: Activity: Goal: Risk for activity intolerance will decrease Outcome: Progressing   Problem: Nutrition: Goal: Adequate nutrition will be maintained Outcome: Progressing   Problem: Coping: Goal: Level of anxiety will decrease Outcome: Progressing   Problem: Elimination: Goal: Will not experience complications related to bowel motility Outcome: Progressing   Problem: Pain Managment: Goal: General experience of comfort will improve Outcome: Progressing   Problem: Skin Integrity: Goal: Risk for impaired skin integrity will decrease Outcome: Progressing   

## 2018-10-29 NOTE — Social Work (Addendum)
Pt PASRR under manual review.   3:51pm- stopped by room, pt discussing offers with liaisons from [facility] and [place] facilities. Pt PASRR still pending, waiting on MD to sign 30 day note on hard chart.   Westley Hummer, MSW, [place] Work [phone]

## 2018-10-29 NOTE — NC FL2 (Addendum)
[place] LEVEL OF CARE SCREENING TOOL     IDENTIFICATION  Patient Name: [name] Birthdate: March 11, 1966 Sex: female Admission Date (Current Location): 10/25/2018  [facility] and [place] Number:  Herbalist and Address:  [facility] [place], [place] [address], [place], [place] [address]      Provider Number: [id number]  Attending Physician Name and Address:  Meredith Pel, MD  Relative Name and Phone Number:  [name]; son; [phone]    Current Level of Care: Hospital Recommended Level of Care: [place] Prior Approval Number:    Date Approved/Denied:   PASRR Number: pending  Discharge Plan: SNF    Current Diagnoses: Patient Active Problem List   Diagnosis Date Noted  . Dog bite of foot, sequela   . Status post surgery 10/25/2018  . Left foot infection 10/25/2018  . Cellulitis of left upper extremity   . Poorly controlled diabetes mellitus ([place])   . UTI (urinary tract infection) 05/23/2018  . Acute metabolic encephalopathy 41/32/4401  . Toxic encephalopathy 05/09/2018  . GERD (gastroesophageal reflux disease) 05/09/2018  . Tobacco abuse 05/09/2018  . Polypharmacy 05/09/2018  . Restless leg syndrome 04/27/2018  . Anxiety 04/27/2018  . Hyponatremia 04/26/2018  . Noncompliance with dietary restriction 04/26/2018  . Postoperative cellulitis of surgical wound 04/26/2018  . At high risk for falls 04/24/2018  . Noncompliance with safety precautions 04/24/2018  . Leukocytosis 04/24/2018  . Unilateral complete BKA ([place]) 04/16/2018  . PAD (peripheral artery disease) ([place])   . Poorly controlled type 2 diabetes mellitus ([place])   . Subacute osteomyelitis, right ankle and foot ([place])   . Major depressive disorder, recurrent episode ([place]) 04/11/2018  . Diabetic ulcer of right midfoot associated with type 2 diabetes mellitus, with necrosis of bone ([place])   . Abscess of ankle   . Infectious synovitis   .  Diabetic foot infection ([place])   . Sepsis ([place]) 04/09/2018  . Cancer of sigmoid colon ([place]) 03/09/2015  . OSA (obstructive sleep apnea) 07/04/2014  . Migraine without aura, with intractable migraine, so stated, without mention of status migrainosus 03/26/2014  . Peripheral neuropathy 03/03/2014  . Abnormal stress test 02/12/2014  . CVA (cerebral infarction) 02/12/2014  . Chest pain   . Abnormal heart rhythm   . Edema   . Hypertension   . Hyperlipemia   . Hypercholesterolemia   . Other and unspecified hyperlipidemia 11/14/2013  . Type II diabetes mellitus, uncontrolled ([place]) 07/22/2013  . Essential hypertension 07/22/2013  . Diabetic neuropathy with neurologic complication ([place]) 02/72/5366  . Diabetic polyneuropathy ([place]) 07/22/2013    Orientation RESPIRATION BLADDER Height & Weight     Self, Time, Situation, Place  Normal Continent Weight: 183 lb 10.3 oz (83.3 kg) Height:  5\' 5"  (165.1 cm)  BEHAVIORAL SYMPTOMS/MOOD NEUROLOGICAL BOWEL NUTRITION STATUS      Continent Diet(see discharge summary)  AMBULATORY STATUS COMMUNICATION OF NEEDS Skin   Extensive Assist Verbally Surgical wounds, Wound Vac(incision on left foot with prevena wound vac)                       Personal Care Assistance Level of Assistance  Bathing, Feeding, Dressing Bathing Assistance: Maximum assistance Feeding assistance: Independent Dressing Assistance: Maximum assistance     Functional Limitations Info  Sight, Hearing, Speech Sight Info: Adequate Hearing Info: Adequate Speech Info: Adequate    SPECIAL CARE FACTORS FREQUENCY  PT (By licensed PT), OT (By licensed OT)  PT Frequency: 5x week OT Frequency: 5x week            Contractures Contractures Info: Not present    Additional Factors Info  Code Status, Allergies, Psychotropic, Insulin Sliding Scale Code Status Info: Full Code Allergies Info: LATEX, SWEETENING ENHANCER FLAVORING AGENT, ASPARTAME AND PHENYLALANINE, IBUPROFEN,  TRAZODONE AND NEFAZODONE, TRIAZOLAM  Psychotropic Info: clonazePAM (KLONOPIN) tablet 1 mg 2x daily; diazepam (VALIUM) tablet 5 mg 2x daily PO; nortriptyline (PAMELOR) capsule 10 mg daily at bedtime PO; QUEtiapine (SEROQUEL) tablet 600 mg daily at bedtime PO Insulin Sliding Scale Info: insulin aspart (novoLOG) injection 0-15 Units 3x daily with meals; insulin aspart (novoLOG) injection 4 Units 3x daily with meals; insulin glargine (LANTUS) injection 22 Units daily       Current Medications (10/29/2018):  This is the current hospital active medication list Current Facility-Administered Medications  Medication Dose Route Frequency Provider Last Rate Last Dose  . 0.9 %  sodium chloride infusion   Intravenous Continuous Newt Minion, MD   Stopped at 10/27/18 1437  . acetaminophen (TYLENOL) tablet 325-650 mg  325-650 mg Oral Q6H PRN Newt Minion, MD      . ampicillin-sulbactam (UNASYN) 1.5 g in sodium chloride 0.9 % 100 mL IVPB  1.5 g Intravenous Q6H Newt Minion, MD 200 mL/hr at 10/29/18 0554 1.5 g at 10/29/18 0554  . bisacodyl (DULCOLAX) suppository 10 mg  10 mg Rectal Daily PRN Newt Minion, MD      . canagliflozin [facility]) tablet 100 mg  100 mg Oral QAC breakfast Newt Minion, MD   100 mg at 10/29/18 0900  . clonazePAM (KLONOPIN) tablet 1 mg  1 mg Oral BID Newt Minion, MD   1 mg at 10/28/18 2134  . cyclobenzaprine (FLEXERIL) tablet 10 mg  10 mg Oral TID PRN Newt Minion, MD      . diazepam (VALIUM) tablet 5 mg  5 mg Oral BID Newt Minion, MD   5 mg at 10/28/18 2135  . docusate sodium (COLACE) capsule 100 mg  100 mg Oral BID Newt Minion, MD   100 mg at 10/28/18 2136  . gabapentin (NEURONTIN) tablet 600 mg  600 mg Oral BID Newt Minion, MD   600 mg at 10/28/18 1549  . HYDROmorphone (DILAUDID) injection 0.5-1 mg  0.5-1 mg Intravenous Q4H PRN Newt Minion, MD   1 mg at 10/27/18 0201  . Influenza vac split quadrivalent PF (FLUARIX) injection 0.5 mL  0.5 mL Intramuscular Prior to  discharge Newt Minion, MD      . insulin aspart (novoLOG) injection 0-15 Units  0-15 Units Subcutaneous TID WC Newt Minion, MD   3 Units at 10/29/18 0901  . insulin aspart (novoLOG) injection 4 Units  4 Units Subcutaneous TID WC Newt Minion, MD   4 Units at 10/29/18 0901  . insulin glargine (LANTUS) injection 22 Units  22 Units Subcutaneous Daily Newt Minion, MD   22 Units at 10/28/18 1003  . magnesium citrate solution 1 Bottle  1 Bottle Oral Once PRN Newt Minion, MD      . methocarbamol (ROBAXIN) tablet 500 mg  500 mg Oral Q6H PRN Newt Minion, MD   500 mg at 10/27/18 0615   Or  . methocarbamol (ROBAXIN) 500 mg in dextrose 5 % 50 mL IVPB  500 mg Intravenous Q6H PRN Newt Minion, MD      . metoCLOPramide (REGLAN) tablet 5-10 mg  5-10 mg Oral Q8H PRN Newt Minion, MD       Or  . metoCLOPramide (REGLAN) injection 5-10 mg  5-10 mg Intravenous Q8H PRN Newt Minion, MD      . nicotine (NICODERM CQ - dosed in mg/24 hours) patch 21 mg  21 mg Transdermal Daily Newt Minion, MD   21 mg at 10/28/18 1005  . nortriptyline (PAMELOR) capsule 10 mg  10 mg Oral QHS Newt Minion, MD   10 mg at 10/28/18 2135  . ofloxacin (OCUFLOX) 0.3 % ophthalmic solution 1 drop  1 drop Right Eye BID Newt Minion, MD   1 drop at 10/28/18 2136  . ondansetron (ZOFRAN) tablet 4 mg  4 mg Oral Q6H PRN Newt Minion, MD   4 mg at 10/26/18 1042   Or  . ondansetron (ZOFRAN) injection 4 mg  4 mg Intravenous Q6H PRN Newt Minion, MD      . oxyCODONE (Oxy IR/ROXICODONE) immediate release tablet 10-15 mg  10-15 mg Oral Q4H PRN Newt Minion, MD   15 mg at 10/29/18 0552  . oxyCODONE (Oxy IR/ROXICODONE) immediate release tablet 5-10 mg  5-10 mg Oral Q4H PRN Newt Minion, MD   10 mg at 10/26/18 0423  . oxyCODONE (Oxy IR/ROXICODONE) immediate release tablet 5-10 mg  5-10 mg Oral Q4H PRN Meredith Pel, MD   10 mg at 10/27/18 1426  . pantoprazole (PROTONIX) EC tablet 40 mg  40 mg Oral Daily Newt Minion,  MD   40 mg at 10/28/18 1006  . polyethylene glycol (MIRALAX / GLYCOLAX) packet 17 g  17 g Oral Daily PRN Newt Minion, MD      . QUEtiapine (SEROQUEL) tablet 600 mg  600 mg Oral QHS Newt Minion, MD   600 mg at 10/28/18 2135  . rOPINIRole (REQUIP) tablet 5 mg  5 mg Oral TID Newt Minion, MD   5 mg at 10/28/18 2134  . tamsulosin (FLOMAX) capsule 0.4 mg  0.4 mg Oral Daily Newt Minion, MD   0.4 mg at 10/28/18 1006     Discharge Medications: Please see discharge summary for a list of discharge medications.  Relevant Imaging Results:  Relevant Lab Results:   Additional Information SS#[id number] [place], [place]

## 2018-10-29 NOTE — Anesthesia Postprocedure Evaluation (Signed)
Anesthesia Post Note  Patient: [name]  Procedure(s) Performed: LEFT FOOT FIRST RAY AMPUTATION (Left Foot) APPLICATION OF WOUND VAC (Left Foot)     Patient location during evaluation: PACU Anesthesia Type: General Level of consciousness: awake and alert Pain management: pain level controlled Vital Signs Assessment: post-procedure vital signs reviewed and stable Respiratory status: spontaneous breathing, nonlabored ventilation and respiratory function stable Cardiovascular status: blood pressure returned to baseline and stable Postop Assessment: no apparent nausea or vomiting Anesthetic complications: no    Last Vitals:  Vitals:   10/28/18 2111 10/29/18 0542  BP: 136/67 115/70  Pulse: 88 88  Resp: 18 20  Temp: 36.7 C 36.5 C  SpO2: 99% 95%    Last Pain:  Vitals:   10/29/18 0542  TempSrc: Oral  PainSc:                  Laverne Hursey,W. EDMOND

## 2018-10-29 NOTE — Discharge Summary (Signed)
Discharge Diagnoses:  Active Problems:   Status post surgery   Left foot infection   Dog bite of foot, sequela   Surgeries: Procedure(s): LEFT FOOT FIRST RAY AMPUTATION APPLICATION OF WOUND VAC on 10/26/2018    Consultants: Treatment Team:  Meredith Pel, MD  Discharged Condition: Improved  Hospital Course: [name] is an 52 y.o. female who was admitted 10/25/2018 with a chief complaint of dog chewed off her great toe left foot, with a final diagnosis of abscess of left foot.  Patient was brought to the operating room on 10/26/2018 and underwent Procedure(s): LEFT FOOT FIRST RAY AMPUTATION APPLICATION OF WOUND VAC.    Patient was given perioperative antibiotics:  Anti-infectives (From admission, onward)   Start     Dose/Rate Route Frequency Ordered Stop   10/26/18 0000  ampicillin-sulbactam (UNASYN) 1.5 g in sodium chloride 0.9 % 100 mL IVPB     1.5 g 200 mL/hr over 30 Minutes Intravenous Every 6 hours 10/25/18 2021     10/25/18 1745  Ampicillin-Sulbactam (UNASYN) 3 g in sodium chloride 0.9 % 100 mL IVPB     3 g 200 mL/hr over 30 Minutes Intravenous  Once 10/25/18 1733 10/25/18 1800   10/25/18 0645  Ampicillin-Sulbactam (UNASYN) 3 g in sodium chloride 0.9 % 100 mL IVPB     3 g 200 mL/hr over 30 Minutes Intravenous  Once 10/25/18 0637 10/25/18 0757    .  Patient was given sequential compression devices, early ambulation, and aspirin for DVT prophylaxis.  Recent vital signs:  Patient Vitals for the past 24 hrs:  BP Temp Temp src Pulse Resp SpO2  10/29/18 0542 115/70 97.7 F (36.5 C) Oral 88 20 95 %  10/28/18 2111 136/67 98.1 F (36.7 C) Oral 88 18 99 %  10/28/18 1310 (!) 155/81 97.9 F (36.6 C) Oral 78 18 100 %  .  Recent laboratory studies: No results found.  Discharge Medications:   Allergies as of 10/29/2018      Reactions   Latex Itching, Rash, Other (See Comments)   Pt states she cannot use condoms - cause an infection.  Use of latex on skin is okay  for short period of time.  Tape causes rash   Sweetening Enhancer [flavoring Agent] Nausea And Vomiting, Other (See Comments)   HEADACHES   Aspartame And Phenylalanine Nausea And Vomiting   HEADACHES   Ibuprofen Other (See Comments)   HEADACHES   Trazodone And Nefazodone Other (See Comments)   Hallucinations   Triazolam Other (See Comments)   HALLUCINATIONS      Medication List    STOP taking these medications   doxycycline 100 MG tablet Commonly known as:  VIBRA-TABS     TAKE these medications   Armodafinil 250 MG tablet Take 250 mg by mouth daily.   aspirin-acetaminophen-caffeine 250-250-65 MG tablet Commonly known as:  EXCEDRIN MIGRAINE Take 2 tablets by mouth every 6 (six) hours as needed for headache.   BASAGLAR KWIKPEN 100 UNIT/ML Sopn Inject 0.22 mLs (22 Units total) into the skin daily.   bisacodyl 5 MG EC tablet Commonly known as:  DULCOLAX Take 1 tablet (5 mg total) by mouth daily as needed for moderate constipation.   clonazePAM 1 MG tablet Commonly known as:  KLONOPIN Take 1 mg by mouth 2 (two) times daily.   cyclobenzaprine 10 MG tablet Commonly known as:  FLEXERIL Take 10 mg by mouth 3 (three) times daily as needed for muscle spasms.   diazepam 5 MG tablet  Commonly known as:  VALIUM Take 5 mg by mouth 2 (two) times daily.   DUREZOL 0.05 % Emul Generic drug:  Difluprednate Place 1 drop into the right eye 2 (two) times daily.   empagliflozin 25 MG Tabs tablet Commonly known as:  JARDIANCE Take 25 mg by mouth daily.   esomeprazole 40 MG capsule Commonly known as:  NEXIUM Take 40 mg by mouth daily.   gabapentin 600 MG tablet Commonly known as:  NEURONTIN Take 1 tablet (600 mg total) by mouth 2 (two) times daily. Take in the morning and afternoon. Do not take at night time. What changed:    how much to take  when to take this  additional instructions   glucose blood test strip USE TO CHECK BLOOD SUGARS DAILY. DX CODE E11.65   HETLIOZ  20 MG Caps Generic drug:  Tasimelteon Take 20 mg by mouth at bedtime.   INGREZZA 80 MG Caps Generic drug:  Valbenazine Tosylate Take 80 mg by mouth at bedtime.   insulin aspart 100 UNIT/ML FlexPen Commonly known as:  NOVOLOG Inject 6 units with each meal daily and use sliding scale for further dosage   lidocaine 5 % Commonly known as:  LIDODERM Place 1 patch onto the skin daily as needed (pain). Remove & Discard patch within 12 hours or as directed by MD   mineral oil-hydrophilic petrolatum ointment Apply topically as needed for dry skin.   nortriptyline 10 MG capsule Commonly known as:  PAMELOR Take 10 mg by mouth at bedtime.   ofloxacin 0.3 % ophthalmic solution Commonly known as:  OCUFLOX Place 1 drop into the right eye 2 (two) times daily.   ondansetron 4 MG tablet Commonly known as:  ZOFRAN Take 1 tablet (4 mg total) by mouth every 6 (six) hours as needed for nausea.   oxyCODONE 5 MG immediate release tablet Commonly known as:  Oxy IR/ROXICODONE Take 1 tablet (5 mg total) by mouth every 6 (six) hours as needed for severe pain. What changed:    when to take this  reasons to take this   oxyCODONE-acetaminophen 10-325 MG tablet Commonly known as:  PERCOCET Take 1 tablet by mouth every 6 (six) hours as needed for pain. What changed:    when to take this  Another medication with the same name was removed. Continue taking this medication, and follow the directions you see here.   pantoprazole 40 MG tablet Commonly known as:  PROTONIX Take 1 tablet (40 mg total) by mouth daily.   QUEtiapine 300 MG tablet Commonly known as:  SEROQUEL Take 600 mg by mouth at bedtime. One hour before bed   ropinirole 5 MG tablet Commonly known as:  REQUIP Take 1 tablet (5 mg total) by mouth 3 (three) times daily.   rOPINIRole 2 MG tablet Commonly known as:  REQUIP Take 4 mg by mouth 3 (three) times daily.   tamsulosin 0.4 MG Caps capsule Commonly known as:  FLOMAX Take  0.4 mg by mouth daily.   VRAYLAR 6 MG Caps Generic drug:  Cariprazine HCl Take 6 mg by mouth at bedtime.            Discharge Care Instructions  (From admission, onward)         Start     Ordered   10/29/18 0000  Non weight bearing    Question Answer Comment  Laterality left   Extremity Lower      10/29/18 0703   10/27/18 0000  Non weight bearing  Question Answer Comment  Laterality left   Extremity Lower      10/27/18 1203          Diagnostic Studies: Dg Chest Portable 1 View  Result Date: 10/25/2018 CLINICAL DATA:  Preop for left great toe surgery. History of COPD and dyspnea on exertion. Current smoker. EXAM: PORTABLE CHEST 1 VIEW COMPARISON:  07/03/2018 FINDINGS: Heart, mediastinum and hila are within normal limits. Clear lungs.  No pleural effusion or pneumothorax. Status post previous anterior cervical spine fusion. Skeletal structures are grossly intact. IMPRESSION: No active disease. Electronically Signed   By: Lajean Manes M.D.   On: 10/25/2018 07:48   Dg Foot Complete Left  Result Date: 10/25/2018 CLINICAL DATA:  Pt's dog ate pt's 1st toe on Lt foot completely off while she slept last night, initial encounter EXAM: LEFT FOOT - COMPLETE 3+ VIEW COMPARISON:  None. FINDINGS: There is a bony defect in the first toe with loss of the phalanges as well as a portion of the first metatarsal head consistent with the patient's given clinical history. Considerable soft tissue defect is noted consistent with the given clinical history as well. No radiopaque foreign body is seen. No definitive erosive changes in the bone are noted. Degenerative changes in the tarsal bones are seen. Calcaneal spurring is noted. IMPRESSION: Changes consistent with the given clinical history of dog eating first toe. The bony defect extends into the head of the first metatarsal. Electronically Signed   By: Inez Catalina M.D.   On: 10/25/2018 07:21    Patient benefited maximally from their  hospital stay and there were no complications.     Disposition: Discharge disposition: 03-Skilled Nursing Facility      Discharge Instructions    Call MD / Call 911   Complete by:  As directed    If you experience chest pain or shortness of breath, CALL 911 and be transported to the hospital emergency room.  If you develope a fever above 101 F, pus (white drainage) or increased drainage or redness at the wound, or calf pain, call your surgeon's office.   Constipation Prevention   Complete by:  As directed    Drink plenty of fluids.  Prune juice may be helpful.  You may use a stool softener, such as Colace (over the counter) 100 mg twice a day.  Use MiraLax (over the counter) for constipation as needed.   Diet - low sodium heart healthy   Complete by:  As directed    Increase activity slowly as tolerated   Complete by:  As directed    Negative Pressure Wound Therapy - Incisional   Complete by:  As directed    Negative Pressure Wound Therapy - Incisional   Complete by:  As directed    Non weight bearing   Complete by:  As directed    Laterality:  left   Extremity:  Lower   Non weight bearing   Complete by:  As directed    Laterality:  left   Extremity:  Lower     Follow-up Information    Newt Minion, MD In 1 week.   Specialty:  Orthopedic Surgery Contact information: [place] [address] [phone]            Signed: Newt Minion 10/29/2018, 7:03 AM

## 2018-10-29 NOTE — Clinical Social Work Note (Signed)
Clinical Social Work Assessment  Patient Details  Name: [name] MRN: [id number] Date of Birth: September 16, 1966  Date of referral:  10/29/18               Reason for consult:  Facility Placement, Discharge Planning                Permission sought to share information with:  Facility Sport and exercise psychologist, Family Supports Permission granted to share information::  Yes, Verbal Permission Granted  Name::      [name]  Agency::    SNFs  Relationship::   son  Contact Information:    [phone]  Housing/Transportation Living arrangements for the past 2 months:  Apartment Source of Information:  Patient Patient Interpreter Needed:  None Criminal Activity/Legal Involvement Pertinent to Current Situation/Hospitalization:  No - Comment as needed Significant Relationships:  Adult Children, Siblings, Other Family Members, Pets Lives with:  Self, Pets Do you feel safe going back to the place where you live?  Yes Need for family participation in patient care:  Yes (Comment)  Care giving concerns: Pt lives alone with her dog who chewed part of her foot and toes off while she was sleeping. 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 Pt worried about learning to balance on foot given new amputation.   Social Worker assessment / plan:  CSW spoke with pt at bedside. Introduced self, role, and reason for visit. Pt states she lives at home with her dog who according to pt chewed off part of her toes and foot while she was sleeping. She awoke to find part of her foot gone, which needed further amputation. Pt states she is worried about learning how to walk and balance without part of her foot given that she is already a previous amputee. Pt interested in skilled nursing. She would like to see her offers and also inquired to the ability to smoke while at facility.   CSW will return with offers for SNF, await PASRR- need 30 day note on the hard chart signed by MD.  Employment status:  Unemployed Insurance  information:  Medicare PT Recommendations:  [place], 24 Hour Supervision Information / Referral to community resources:  [place]  Patient/Family's Response to care:  Pt amenable to CSW visit, interested in SNF placement. Preference for SNF where she can smoke.  Patient/Family's Understanding of and Emotional Response to Diagnosis, Current Treatment, and Prognosis:  Pt states understanding of SNF referral, her diagnosis, current treatment and prognosis. Pt clear with her lack of 24/7 support and interest in rehab. Pt emotionally appropriate with CSW, appears happy with care here at the hospital.  Emotional Assessment Appearance:  Appears stated age Attitude/Demeanor/Rapport:  Engaged Affect (typically observed):  Accepting, Adaptable, Appropriate Orientation:  Oriented to Self, Oriented to  Time, Oriented to Place, Oriented to Situation Alcohol / Substance use:  Tobacco Use Psych involvement (Current and /or in the community):  No (Comment)  Discharge Needs  Concerns to be addressed:  Care Coordination Readmission within the last 30 days:  No Current discharge risk:    Barriers to Discharge:  Continued Medical Work up, Programmer, applications ([place])   Alexander Mt, [place] 10/29/2018, 1:09 PM

## 2018-10-29 NOTE — Progress Notes (Signed)
Patient ID: [name], female   DOB: 1966-03-01, 52 y.o.   MRN: [id number] Patient without complaints this morning.  There is no new drainage in the wound VAC canister.  Will discharge to skilled nursing when bed is available.  Orders written for discharge to skilled nursing.

## 2018-10-29 NOTE — Care Management Important Message (Signed)
Important Message  Patient Details  Name: [name] MRN: [id number] Date of Birth: 1966/06/20   Medicare Important Message Given:  Yes    Orbie Pyo 10/29/2018, 4:08 PM

## 2018-10-30 LAB — GLUCOSE, CAPILLARY
Glucose-Capillary: 138 mg/dL — ABNORMAL HIGH (ref 70–99)
Glucose-Capillary: 167 mg/dL — ABNORMAL HIGH (ref 70–99)
Glucose-Capillary: 168 mg/dL — ABNORMAL HIGH (ref 70–99)
Glucose-Capillary: 237 mg/dL — ABNORMAL HIGH (ref 70–99)

## 2018-10-30 NOTE — Social Work (Addendum)
Pt has chosen SNF placement at [facility].  Await confirmation for transfer with Tammy, [facility] liaison.  4:15pm- CSW spoke with [facility] liaison, pt family to bring in two home medications: Hetlioz and Vraylar due to SNF pharmacy being unable to fill these prescriptions. When these medications available pt will discharge to SNF. Attending MD aware.   Westley Hummer, MSW, [place] Work [phone]

## 2018-10-30 NOTE — Discharge Summary (Signed)
  No change in discharge status from discharge summary from yesterday, plan for discharge to SNF today

## 2018-10-30 NOTE — Progress Notes (Signed)
PT Cancellation Note  Patient Details Name: [name] MRN: [id number] DOB: 08-21-1966   Cancelled Treatment:    Reason Eval/Treat Not Completed: Patient declined, no reason specified Pt awaiting d/c to SNF and declined mobility at this time.    Salina April, PTA Acute Rehabilitation Services Pager: [phone] Office: [phone]   10/30/2018, 3:37 PM

## 2018-10-31 LAB — GLUCOSE, CAPILLARY
GLUCOSE-CAPILLARY: 215 mg/dL — AB (ref 70–99)
GLUCOSE-CAPILLARY: 177 mg/dL — AB (ref 70–99)
Glucose-Capillary: 250 mg/dL — ABNORMAL HIGH (ref 70–99)

## 2018-10-31 NOTE — Clinical Social Work Placement (Signed)
   CLINICAL SOCIAL WORK PLACEMENT  NOTE [facility]  Date:  10/31/2018  Patient Details  Name: [name] MRN: [id number] Date of Birth: Jan 25, 1966  Clinical Social Work is seeking post-discharge placement for this patient at the [place] level of care (*CSW will initial, date and re-position this form in  chart as items are completed):  Yes   Patient/family provided with [place] Work Department's list of facilities offering this level of care within the geographic area requested by the patient (or if unable, by the patient's family).  Yes   Patient/family informed of their freedom to choose among providers that offer the needed level of care, that participate in Medicare, Medicaid or managed care program needed by the patient, have an available bed and are willing to accept the patient.  Yes   Patient/family informed of [facility]'s ownership interest in [facility], as well as of the fact that they are under no obligation to receive care at these facilities.  PASRR submitted to EDS on 10/29/18     PASRR number received on       Existing PASRR number confirmed on       FL2 transmitted to all facilities in geographic area requested by pt/family on 10/29/18     FL2 transmitted to all facilities within larger geographic area on       Patient informed that his/her managed care company has contracts with or will negotiate with certain facilities, including the following:        Yes   Patient/family informed of bed offers received.  Patient chooses bed at Other - please specify in the comment section below:([facility])     Physician recommends and patient chooses bed at      Patient to be transferred to Other - please specify in the comment section below:([facility]) on 10/30/18.  Patient to be transferred to facility by [facility]     Patient family notified on 10/31/18 of transfer.  Name of  family member notified:  pt responsible for self     PHYSICIAN Please prepare priority discharge summary, including medications     Additional Comment:    _______________________________________________ Alexander Mt, LCSWA 10/31/2018, 1:46 PM

## 2018-10-31 NOTE — Progress Notes (Signed)
Patient ID: [name], female   DOB: 04/21/1966, 52 y.o.   MRN: [id number] Patient ready for DC to SNF today. No new changes.  Family to bring in medications from home, [place] and Arman Filter, as noted per social work note yesterday.  Prescriptions for pain medications on paper chart.  Prevena VAC at discharge. Follow up in the office next week.   VAC in place on left foot and functioning well.   [place]

## 2018-10-31 NOTE — Progress Notes (Signed)
Report was called to [place] at [facility]. Patient wound vac attached and patent without leak. Patient left unit in stable condition via [facility].

## 2018-10-31 NOTE — Social Work (Addendum)
Pt family has brought in home medications, there is only a few pills left of one home med. Pt had them delivered to her home per manufacturer. Tammy, [facility] liaison will go by patients home to see if they have been delivered. Confirmed with assistant director Nathaniel Man that due to the inability of the SNF pharmacy to fill the prescriptions that they could have pt bring home meds.  1:01pm- Medications received. Clinical Social Worker facilitated patient discharge including contacting patient family and facility to confirm patient discharge plans.  Clinical information faxed to facility and family agreeable with plan.  CSW arranged ambulance transport via [place] to [facility]. RN to call [phone]  with report prior to discharge.  Clinical Social Worker will sign off for now as social work intervention is no longer needed. Please consult [place] again if new need arises.  Alexander Mt, [place] Social Worker [phone]

## 2018-11-05 ENCOUNTER — Telehealth (INDEPENDENT_AMBULATORY_CARE_PROVIDER_SITE_OTHER): Payer: Self-pay | Admitting: Orthopedic Surgery

## 2018-11-05 ENCOUNTER — Encounter (INDEPENDENT_AMBULATORY_CARE_PROVIDER_SITE_OTHER): Payer: Self-pay | Admitting: Orthopedic Surgery

## 2018-11-05 ENCOUNTER — Ambulatory Visit (INDEPENDENT_AMBULATORY_CARE_PROVIDER_SITE_OTHER): Payer: Medicare Other | Admitting: Physician Assistant

## 2018-11-05 VITALS — Ht 65.0 in | Wt 183.6 lb

## 2018-11-05 DIAGNOSIS — Z89412 Acquired absence of left great toe: Secondary | ICD-10-CM

## 2018-11-05 DIAGNOSIS — E1142 Type 2 diabetes mellitus with diabetic polyneuropathy: Secondary | ICD-10-CM

## 2018-11-05 DIAGNOSIS — Z794 Long term (current) use of insulin: Secondary | ICD-10-CM | POA: Diagnosis not present

## 2018-11-05 DIAGNOSIS — Z89511 Acquired absence of right leg below knee: Secondary | ICD-10-CM

## 2018-11-05 NOTE — Telephone Encounter (Signed)
Chris/[facility] called wanted to know if she has recent surgery below the knee on right side.  Please call Gerald Stabs to advise so they can know weight bearing status. [phone]

## 2018-11-07 ENCOUNTER — Encounter (INDEPENDENT_AMBULATORY_CARE_PROVIDER_SITE_OTHER): Payer: Self-pay | Admitting: Physician Assistant

## 2018-11-07 NOTE — Telephone Encounter (Signed)
Pt had  Surgery on her left foot. The right foot is ok for weight bearing. I tried to reach therapist and the voicemail was full. Will hold message and try again.

## 2018-11-07 NOTE — Progress Notes (Signed)
Office Visit Note   Patient: [name]           Date of Birth: 1966/04/11           MRN: [id number] Visit Date: 11/05/2018              Requested by: Tamsen Roers, [place], [place] [address] PCP: Tamsen Roers, MD  Chief Complaint  Patient presents with  . Left Foot - Routine Post Op    1st Ray Amp of left foot      HPI: The patient is a 52 year old female who seen for postoperative follow-up following left foot first ray amputation on 10/26/2018.  The patient states that her Doberman pinscher dog apparently was chewing on her big toe and as she has no sensation she did not realize it at first and developed severe infection in the area and required amputation.  Following the amputation she has been discharged to skilled nursing but she states that she wants to get out of skilled nursing as soon as she can and return home.  She does actively use a prosthesis on the right lower extremity for ambulation.  She reports that her pain is been severe but most of her pain is coming from her neck.  She is previously had multiples cervical spine procedures done by Dr. Vertell Limber.  She reports that she had a MRI scan of her neck done about a year ago and it showed severe changes and she requested to be referred for treatment for her neck and Dr. due to discussed referral to Dr. Ernestina Patches for possible steroid injection for her cervical spine symptoms.  Assessment & Plan: Visit Diagnoses:  1. 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 Acquired absence of right leg below knee (HCC)   3. Type 2 diabetes mellitus with diabetic polyneuropathy, with long-term current use of insulin (HCC)     Plan: The VAC dressing was removed and the incision was covered with 4 x 4 gauze and an Ace wrap.  She was also placed in a fracture boot as she does anticipate that she will be discharged from skilled nursing fairly quickly following removal of the VAC.  Orders were sent with the  patient for her left foot care to the nursing facility.  She reports that she wants some kind of protection to keep her dog from accidentally chewing on her foot again and we have placed her in a fracture boot.  She will be referred to Dr. Ernestina Patches for her chronic neck symptoms.  We will see her back here for her foot in 1 week or sooner should she have difficulties in the interim.  Follow-Up Instructions: Return in about 1 week (around 11/12/2018).   Ortho Exam  Patient is alert, oriented, no adenopathy, well-dressed, normal affect, normal respiratory effort. Patient presents at the wheelchair level.  She has a right lower extremity prosthesis.  Her left foot Praveena VAC was removed and the incision is intact with sutures intact.  She does have some widening and edema about the area with some minimal serous drainage.  There are no signs of infection or cellulitis.  Imaging: No results found. No images are attached to the encounter.  Labs: Lab Results  Component Value Date   HGBA1C 10.6 (H) 07/03/2018   HGBA1C 9.2 03/19/2018   HGBA1C 10.0 12/11/2017   ESRSEDRATE 55 (H) 02/19/2016   CRP 17.4 (H) 02/19/2016   LABURIC 5.8 02/19/2016  REPTSTATUS 07/04/2018 FINAL 07/03/2018   GRAMSTAIN  04/11/2018    RARE WBC PRESENT, PREDOMINANTLY PMN NO ORGANISMS SEEN    CULT  07/03/2018    NO GROWTH Performed at [facility] [address], [place], [place] [address]    LABORGA ESCHERICHIA COLI (A) 05/24/2018     Lab Results  Component Value Date   ALBUMIN 3.3 (L) 07/03/2018   ALBUMIN 3.3 (L) 06/28/2018   ALBUMIN 3.8 05/22/2018   LABURIC 5.8 02/19/2016    Body mass index is 30.56 kg/m.  Orders:  No orders of the defined types were placed in this encounter.  No orders of the defined types were placed in this encounter.    Procedures: No procedures performed  Clinical Data: No additional findings.  ROS:  All other systems negative, except as noted in the HPI. Review of  Systems  Objective: Vital Signs: Ht 5\' 5"  (1.651 m)   Wt 183 lb 10.2 oz (83.3 kg)   BMI 30.56 kg/m   Specialty Comments:  No specialty comments available.  PMFS History: Patient Active Problem List   Diagnosis Date Noted  . Dog bite of foot, sequela   . Status post surgery 10/25/2018  . Left foot infection 10/25/2018  . Cellulitis of left upper extremity   . Poorly controlled diabetes mellitus ([place])   . UTI (urinary tract infection) 05/23/2018  . Acute metabolic encephalopathy 02/54/2706  . Toxic encephalopathy 05/09/2018  . GERD (gastroesophageal reflux disease) 05/09/2018  . Tobacco abuse 05/09/2018  . Polypharmacy 05/09/2018  . Restless leg syndrome 04/27/2018  . Anxiety 04/27/2018  . Hyponatremia 04/26/2018  . Noncompliance with dietary restriction 04/26/2018  . Postoperative cellulitis of surgical wound 04/26/2018  . At high risk for falls 04/24/2018  . Noncompliance with safety precautions 04/24/2018  . Leukocytosis 04/24/2018  . Unilateral complete BKA ([place]) 04/16/2018  . PAD (peripheral artery disease) ([place])   . Poorly controlled type 2 diabetes mellitus ([place])   . Subacute osteomyelitis, right ankle and foot ([place])   . Major depressive disorder, recurrent episode ([place]) 04/11/2018  . Diabetic ulcer of right midfoot associated with type 2 diabetes mellitus, with necrosis of bone ([place])   . Abscess of ankle   . Infectious synovitis   . Diabetic foot infection ([place])   . Sepsis ([place]) 04/09/2018  . Cancer of sigmoid colon ([place]) 03/09/2015  . OSA (obstructive sleep apnea) 07/04/2014  . Migraine without aura, with intractable migraine, so stated, without mention of status migrainosus 03/26/2014  . Peripheral neuropathy 03/03/2014  . Abnormal stress test 02/12/2014  . CVA (cerebral infarction) 02/12/2014  . Chest pain   . Abnormal heart rhythm   . Edema   . Hypertension   . Hyperlipemia   . Hypercholesterolemia   . Other and unspecified hyperlipidemia 11/14/2013    . Type II diabetes mellitus, uncontrolled ([place]) 07/22/2013  . Essential hypertension 07/22/2013  . Diabetic neuropathy with neurologic complication ([place]) 23/76/2831  . Diabetic polyneuropathy ([place]) 07/22/2013   Past Medical History:  Diagnosis Date  . Anxiety   . Arthritis   . Bipolar disorder ([place])   . Broken jaw ([place])   . Broken wrist   . Chronic back pain   . Claudication ([place])    a. 12/2013 ABI's: R 0.97, L 0.94.  [name] COPD (chronic obstructive pulmonary disease) ([place])   . Depression   . Diabetic Charcot's foot ([place])   . Diabetic foot ulcer ([place])    chronic left foot ulcer , great toe/  hx recurrent foot ulcer bilaterally  . DJD (degenerative joint disease)   . Edema of both lower extremities   . Episode of memory loss   . GERD (gastroesophageal reflux disease)   . Headache(784.0)   . Hiatal hernia   . History of colon cancer, stage I dx 02-26-2015--- oncologist-- dr [name]--- per last note no recurrence   05-16-2015 s/p  Laparoscopic sigmoid colectomy w/ node bx's (negative nodes per path)  Stage I (pT1,N0,M0) Grade 2  . 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   per MRI multiple small strokes post cardiac cath. --  residual mild memory loss  . History of methicillin resistant staphylococcus aureus (MRSA)   . Hypercholesterolemia   . Insomnia   . Insulin dependent type 2 diabetes mellitus, uncontrolled ([place]) dx 2004   endocrinologist-  dr Dwyane Dee-- last A1c 8.8 in Aug2018:  pt is noncompliant w/ diet, states does note eat breakfast , her first main meal in afternoon  . Neurogenic bladder   . Neuropathy, diabetic ([place])    hands and feet  . OSA (obstructive sleep apnea)    cpap intolerant  . Personality disorder ([place])   . Restless leg syndrome   . SOB (shortness of breath) on exertion   . Stroke ([place])   . SUI (stress urinary incontinence, female) S/P SLING 12-29-2011    Family History  Problem Relation Age of Onset  . Hypertension Mother   .  Diabetes Mother   . Cancer - Other Mother        lymphoma   . Cancer - Other Father        lung, bladder cancer   . Heart attack Father   . Cancer - Other Brother        bladder cancer     Past Surgical History:  Procedure Laterality Date  . AMPUTATION Right 04/13/2018   Procedure: RIGHT BELOW KNEE AMPUTATION;  Surgeon: Newt Minion, MD;  Location: [facility];  Service: Orthopedics;  Laterality: Right;  . AMPUTATION Left 10/26/2018   Procedure: LEFT FOOT FIRST RAY AMPUTATION;  Surgeon: Newt Minion, MD;  Location: [facility];  Service: Orthopedics;  Laterality: Left;  . ANTERIOR CERVICAL DECOMP/DISCECTOMY FUSION  2000   C5 - 7  . APPLICATION OF WOUND VAC Left 10/26/2018   Procedure: APPLICATION OF WOUND VAC;  Surgeon: Newt Minion, MD;  Location: [facility];  Service: Orthopedics;  Laterality: Left;  . BACK SURGERY    . CARDIAC CATHETERIZATION  05-22-2008   DR SKAINS   NO SIGNIFECANT CAD/ NORMAL LV/  EF 65%/  NO WALL MOTION ABNORMALITIES  . CARPAL TUNNEL RELEASE Right 04-25-2013  . CATARACT EXTRACTION Right 10/24/2018  . [place]; 1992  . COLON SURGERY    . COLONOSCOPY    . CYSTO N/A 04/30/2013   Procedure: CYSTOSCOPY;  Surgeon: Reece Packer, MD;  Location: [facility];  Service: Urology;  Laterality: N/A;  . CYSTOSCOPY MACROPLASTIQUE IMPLANT N/A 02/06/2018   Procedure: CYSTOSCOPY MACROPLASTIQUE IMPLANT;  Surgeon: Bjorn Loser, MD;  Location: [facility];  Service: Urology;  Laterality: N/A;  . CYSTOSCOPY WITH INJECTION  05/04/2012   Procedure: CYSTOSCOPY WITH INJECTION;  Surgeon: Reece Packer, MD;  Location: [facility];  Service: Urology;  Laterality: N/A;  MACROPLASTIQUE INJECTION  . CYSTOSCOPY WITH INJECTION  08/28/2012   Procedure: CYSTOSCOPY WITH INJECTION;  Surgeon: Reece Packer, MD;  Location: [facility];  Service: Urology;  Laterality: N/A;  cysto and macroplastique   . FOOT SURGERY Bilateral    "related to  [place]"  . HERNIA REPAIR  ?1996   "stomach"  . I&D EXTREMITY Left 10/25/2018   Procedure: IRRIGATION AND DEBRIDEMENT GREAT TOE;  Surgeon: Meredith Pel, MD;  Location: [facility];  Service: Orthopedics;  Laterality: Left;  . INCISION AND DRAINAGE Left 10/25/2018   GREAT TOE  . INCISION AND DRAINAGE OF WOUND Right 04/11/2018   Procedure: IRRIGATION AND DEBRIDEMENT WOUND right foot and right ankle;  Surgeon: Evelina Bucy, DPM;  Location: [facility];  Service: Podiatry;  Laterality: Right;  . KNEE ARTHROSCOPY Left   . KNEE ARTHROSCOPY W/ ALLOGRAFT IMPANT Left    graft x 2  . KNEE SURGERY     TOTAL 8 SURG'S  . LAPAROSCOPIC SIGMOID COLECTOMY N/A 04/22/2015   Procedure: LAPAROSCOPIC HAND ASSISTED SIGMOID COLECTOMY;  Surgeon: Erroll Luna, MD;  Location: [facility];  Service: General;  Laterality: N/A;  . LEFT HEART CATHETERIZATION WITH CORONARY ANGIOGRAM N/A 02/12/2014   Procedure: LEFT HEART CATHETERIZATION WITH CORONARY ANGIOGRAM;  Surgeon: Candee Furbish, MD;  Location: [facility];  Service: Cardiovascular;  Laterality: N/A;   No angiographically significant CAD; normal LVSF, LVEDP 17mmHg,  EF 55% (new finding ef 30% myoview 01-08-2014)  . LUMBAR FUSION    . MANDIBLE FRACTURE SURGERY    . MULTIPLE LAPAROSCOPIES FOR ENDOMETRIOSIS    . PUBOVAGINAL SLING  12/29/2011   Procedure: Gaynelle Arabian;  Surgeon: Reece Packer, MD;  Location[facility];  Service: Urology;  Laterality: N/A;  cysto and sparc sling   . PUBOVAGINAL SLING N/A 04/30/2013   Procedure: REMOVAL OF VAGINAL MESH;  Surgeon: Reece Packer, MD;  Location[facility];  Service: Urology;  Laterality: N/A;  . RECONSTURCTION OF CONGENITAL UTERUS ANOMALY  1983  . REPEAT RECONSTRUCTION ACL LEFT KNEE/ SCREWS REMOVED  03-28-2000   CADAVER GRAFT  . TOTAL ABDOMINAL HYSTERECTOMY  1997   w/BSO  . TRANSTHORACIC ECHOCARDIOGRAM  02/13/2014   ef 45%, hypokinesis base inferior and base inferolateral walls  . UPPER GI ENDOSCOPY      . WOUND DEBRIDEMENT Right 09/05/2016   Procedure: DEBRIDEMENT WOUND WITH GRAFT RIGHT FOOT;  Surgeon: Landis Martins, DPM;  Location: [facility];  Service: Podiatry;  Laterality: Right;  . WRIST FRACTURE SURGERY     Social History   Occupational History  . Not on file  Tobacco Use  . Smoking status: Current Every Day Smoker    Packs/day: 2.00    Years: 46.00    Pack years: 92.00    Types: Cigarettes  . Smokeless tobacco: Never Used  . Tobacco comment: per pt started smoking  age 52  Substance and Sexual Activity  . Alcohol use: No    Comment: 05/24/2018 "special occasions only"  . Drug use: Not Currently  . Sexual activity: Yes

## 2018-11-12 ENCOUNTER — Telehealth (INDEPENDENT_AMBULATORY_CARE_PROVIDER_SITE_OTHER): Payer: Self-pay | Admitting: Orthopedic Surgery

## 2018-11-12 NOTE — Telephone Encounter (Signed)
Lisa/OT/[facility] called for verbal orders of OT:  2x times for 5 weeks [phone]

## 2018-11-12 NOTE — Telephone Encounter (Signed)
I have tried to reach therapist multiple times and there is no answer and his mail box is full so I am unable to leave a message. Will hold and try again.

## 2018-11-12 NOTE — Telephone Encounter (Signed)
I called and sw OT to advise verbal ok for orders and she also asked for social worker consult. Verbal ok given for this as well. Pt is living in a hoarding type situation per therapist and she is crawling on her hands and knees instead of self propelling in wheelchair. Trying to see what services pt may qualify for. Will call with questions.

## 2018-11-12 NOTE — Telephone Encounter (Signed)
See previous message.  I called and sw Lattie Haw and advised verbal ok for OT

## 2018-11-13 ENCOUNTER — Ambulatory Visit (INDEPENDENT_AMBULATORY_CARE_PROVIDER_SITE_OTHER): Payer: Medicare Other | Admitting: Physician Assistant

## 2018-11-13 ENCOUNTER — Encounter (INDEPENDENT_AMBULATORY_CARE_PROVIDER_SITE_OTHER): Payer: Self-pay | Admitting: Orthopedic Surgery

## 2018-11-13 ENCOUNTER — Other Ambulatory Visit (INDEPENDENT_AMBULATORY_CARE_PROVIDER_SITE_OTHER): Payer: Self-pay

## 2018-11-13 VITALS — Ht 65.0 in | Wt 183.6 lb

## 2018-11-13 DIAGNOSIS — E441 Mild protein-calorie malnutrition: Secondary | ICD-10-CM

## 2018-11-13 DIAGNOSIS — Z89511 Acquired absence of right leg below knee: Secondary | ICD-10-CM

## 2018-11-13 DIAGNOSIS — Z89412 Acquired absence of left great toe: Secondary | ICD-10-CM

## 2018-11-13 DIAGNOSIS — E1142 Type 2 diabetes mellitus with diabetic polyneuropathy: Secondary | ICD-10-CM

## 2018-11-13 DIAGNOSIS — M5010 Cervical disc disorder with radiculopathy, unspecified cervical region: Secondary | ICD-10-CM

## 2018-11-13 DIAGNOSIS — Z794 Long term (current) use of insulin: Secondary | ICD-10-CM

## 2018-11-13 MED ORDER — OXYCODONE HCL 5 MG PO TABS: 5.0000 mg | ORAL_TABLET | Freq: Four times a day (QID) | ORAL | 0 refills | Status: DC | PRN

## 2018-11-13 MED ORDER — DOXYCYCLINE HYCLATE 100 MG PO CAPS: 100.0000 mg | ORAL_CAPSULE | Freq: Two times a day (BID) | ORAL | 1 refills | Status: DC

## 2018-11-13 NOTE — Progress Notes (Signed)
Office Visit Note   Patient: [name]           Date of Birth: 1966/08/19           MRN: [id number] Visit Date: 11/13/2018              Requested by: Tamsen Roers, [place], [place] [address] PCP: Tamsen Roers, MD  Chief Complaint  Patient presents with  . Left Foot - Routine Post Op    1st Ray Amp left foot      HPI: The patient is a 52 yo female brought in by a friend who is here for post operative follow up following a left foot 1st ray amputation on 10/26/18. 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 She is walking with her right BK prosthesis and a fracture boot on the left foot with her walker. She reports severe pain chronically, mainly in her neck now and we are going to have her see Dr. Ernestina Patches. She was seeing Dr. Rex Kras for pain management,but reports she has not seen him and has no pain medication. She reports some swelling and redness over the left foot incision.   Assessment & Plan: Visit Diagnoses:  1. 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 Acquired absence of right leg below knee (HCC)   3. Type 2 diabetes mellitus with diabetic polyneuropathy, with long-term current use of insulin ([place])   4. Mild protein malnutrition ([place])     Plan: Will leave sutures in place. Will begin silvadene cream to the left foot incision daily after cleaning the foot  And start Doxycycline 100 mg BID x 14 days. We discussed that she needs to get back in with Dr.Little for her chronic pain management and other medications issues as we cannot prescribe medications for long term use. She acknowledged this and is aware that we will not be able to prescribe medications after today for her pain issues. She can continue to use her fracture boot and we discussed keeping the left foot elevated as much as possible when able and minimizing weight bearing over the left foot as much as possible. follow up in 1 week.   Follow-Up Instructions: Return in about 1 week  (around 11/20/2018).   Ortho Exam  Patient is alert, oriented, no adenopathy, well-dressed, normal affect, normal respiratory effort. The left foot incision is widened and has sutures under tension in place. There is odor and moderate thin drainage from the area. There is erythema and edema of the area. Palpable pedal pulses.   Imaging: No results found. No images are attached to the encounter.  Labs: Lab Results  Component Value Date   HGBA1C 10.6 (H) 07/03/2018   HGBA1C 9.2 03/19/2018   HGBA1C 10.0 12/11/2017   ESRSEDRATE 55 (H) 02/19/2016   CRP 17.4 (H) 02/19/2016   LABURIC 5.8 02/19/2016   REPTSTATUS 07/04/2018 FINAL 07/03/2018   GRAMSTAIN  04/11/2018    RARE WBC PRESENT, PREDOMINANTLY PMN NO ORGANISMS SEEN    CULT  07/03/2018    NO GROWTH Performed at [facility], [place] [address], [place], [place] [address]    LABORGA ESCHERICHIA COLI (A) 05/24/2018     Lab Results  Component Value Date   ALBUMIN 3.3 (L) 07/03/2018   ALBUMIN 3.3 (L) 06/28/2018   ALBUMIN 3.8 05/22/2018   LABURIC 5.8 02/19/2016    Body mass index is 30.56 kg/m.  Orders:  No orders of the defined  types were placed in this encounter.  Meds ordered this encounter  Medications  . oxyCODONE (OXY IR/ROXICODONE) 5 MG immediate release tablet    Sig: Take 1 tablet (5 mg total) by mouth every 6 (six) hours as needed for severe pain.    Dispense:  30 tablet    Refill:  0  . doxycycline (VIBRAMYCIN) 100 MG capsule    Sig: Take 1 capsule (100 mg total) by mouth 2 (two) times daily.    Dispense:  28 capsule    Refill:  1     Procedures: No procedures performed  Clinical Data: No additional findings.  ROS:  All other systems negative, except as noted in the HPI. Review of Systems  Objective: Vital Signs: Ht 5\' 5"  (1.651 m)   Wt 183 lb 10.2 oz (83.3 kg)   BMI 30.56 kg/m   Specialty Comments:  No specialty comments available.  PMFS History: Patient Active Problem List    Diagnosis Date Noted  . Dog bite of foot, sequela   . Status post surgery 10/25/2018  . Left foot infection 10/25/2018  . Cellulitis of left upper extremity   . Poorly controlled diabetes mellitus ([place])   . UTI (urinary tract infection) 05/23/2018  . Acute metabolic encephalopathy 24/40/1027  . Toxic encephalopathy 05/09/2018  . GERD (gastroesophageal reflux disease) 05/09/2018  . Tobacco abuse 05/09/2018  . Polypharmacy 05/09/2018  . Restless leg syndrome 04/27/2018  . Anxiety 04/27/2018  . Hyponatremia 04/26/2018  . Noncompliance with dietary restriction 04/26/2018  . Postoperative cellulitis of surgical wound 04/26/2018  . At high risk for falls 04/24/2018  . Noncompliance with safety precautions 04/24/2018  . Leukocytosis 04/24/2018  . Unilateral complete BKA ([place]) 04/16/2018  . PAD (peripheral artery disease) ([place])   . Poorly controlled type 2 diabetes mellitus ([place])   . Subacute osteomyelitis, right ankle and foot ([place])   . Major depressive disorder, recurrent episode ([place]) 04/11/2018  . Diabetic ulcer of right midfoot associated with type 2 diabetes mellitus, with necrosis of bone ([place])   . Abscess of ankle   . Infectious synovitis   . Diabetic foot infection ([place])   . Sepsis ([place]) 04/09/2018  . Cancer of sigmoid colon ([place]) 03/09/2015  . OSA (obstructive sleep apnea) 07/04/2014  . Migraine without aura, with intractable migraine, so stated, without mention of status migrainosus 03/26/2014  . Peripheral neuropathy 03/03/2014  . Abnormal stress test 02/12/2014  . CVA (cerebral infarction) 02/12/2014  . Chest pain   . Abnormal heart rhythm   . Edema   . Hypertension   . Hyperlipemia   . Hypercholesterolemia   . Other and unspecified hyperlipidemia 11/14/2013  . Type II diabetes mellitus, uncontrolled ([place]) 07/22/2013  . Essential hypertension 07/22/2013  . Diabetic neuropathy with neurologic complication ([place]) 25/36/6440  . Diabetic polyneuropathy ([place]) 07/22/2013     Past Medical History:  Diagnosis Date  . Anxiety   . Arthritis   . Bipolar disorder ([place])   . Broken jaw ([place])   . Broken wrist   . Chronic back pain   . Claudication ([place])    a. 12/2013 ABI's: R 0.97, L 0.94.  [name] COPD (chronic obstructive pulmonary disease) ([place])   . Depression   . Diabetic Charcot's foot ([place])   . Diabetic foot ulcer (HCC)    chronic left foot ulcer , great toe/  hx recurrent foot ulcer bilaterally  . DJD (degenerative joint disease)   . Edema of both lower extremities   . Episode of memory  loss   . GERD (gastroesophageal reflux disease)   . Headache(784.0)   . Hiatal hernia   . History of colon cancer, stage I dx 02-26-2015--- oncologist-- dr [name]--- per last note no recurrence   05-16-2015 s/p  Laparoscopic sigmoid colectomy w/ node bx's (negative nodes per path)  Stage I (pT1,N0,M0) Grade 2  . 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   per MRI multiple small strokes post cardiac cath. --  residual mild memory loss  . History of methicillin resistant staphylococcus aureus (MRSA)   . Hypercholesterolemia   . Insomnia   . Insulin dependent type 2 diabetes mellitus, uncontrolled ([place]) dx 2004   endocrinologist-  dr Dwyane Dee-- last A1c 8.8 in Aug2018:  pt is noncompliant w/ diet, states does note eat breakfast , her first main meal in afternoon  . Neurogenic bladder   . Neuropathy, diabetic ([place])    hands and feet  . OSA (obstructive sleep apnea)    cpap intolerant  . Personality disorder ([place])   . Restless leg syndrome   . SOB (shortness of breath) on exertion   . Stroke ([place])   . SUI (stress urinary incontinence, female) S/P SLING 12-29-2011    Family History  Problem Relation Age of Onset  . Hypertension Mother   . Diabetes Mother   . Cancer - Other Mother        lymphoma   . Cancer - Other Father        lung, bladder cancer   . Heart attack Father   . Cancer - Other Brother        bladder cancer     Past Surgical  History:  Procedure Laterality Date  . AMPUTATION Right 04/13/2018   Procedure: RIGHT BELOW KNEE AMPUTATION;  Surgeon: Newt Minion, MD;  Location: [facility];  Service: Orthopedics;  Laterality: Right;  . AMPUTATION Left 10/26/2018   Procedure: LEFT FOOT FIRST RAY AMPUTATION;  Surgeon: Newt Minion, MD;  Location: [facility];  Service: Orthopedics;  Laterality: Left;  . ANTERIOR CERVICAL DECOMP/DISCECTOMY FUSION  2000   C5 - 7  . APPLICATION OF WOUND VAC Left 10/26/2018   Procedure: APPLICATION OF WOUND VAC;  Surgeon: Newt Minion, MD;  Location: [facility];  Service: Orthopedics;  Laterality: Left;  . BACK SURGERY    . CARDIAC CATHETERIZATION  05-22-2008   DR SKAINS   NO SIGNIFECANT CAD/ NORMAL LV/  EF 65%/  NO WALL MOTION ABNORMALITIES  . CARPAL TUNNEL RELEASE Right 04-25-2013  . CATARACT EXTRACTION Right 10/24/2018  . [place]; 1992  . COLON SURGERY    . COLONOSCOPY    . CYSTO N/A 04/30/2013   Procedure: CYSTOSCOPY;  Surgeon: Reece Packer, MD;  [facility];  Service: Urology;  Laterality: N/A;  . CYSTOSCOPY MACROPLASTIQUE IMPLANT N/A 02/06/2018   Procedure: CYSTOSCOPY MACROPLASTIQUE IMPLANT;  Surgeon: Bjorn Loser, MD;  [facility];  Service: Urology;  Laterality: N/A;  . CYSTOSCOPY WITH INJECTION  05/04/2012   Procedure: CYSTOSCOPY WITH INJECTION;  Surgeon: Reece Packer, MD;  Location: [facility];  Service: Urology;  Laterality: N/A;  MACROPLASTIQUE INJECTION  . CYSTOSCOPY WITH INJECTION  08/28/2012   Procedure: CYSTOSCOPY WITH INJECTION;  Surgeon: Reece Packer, MD;  Location: [facility];  Service: Urology;  Laterality: N/A;  cysto and macroplastique   . FOOT SURGERY Bilateral    "related to [place]"  . HERNIA REPAIR  ?1996   "  stomach"  . I&D EXTREMITY Left 10/25/2018   Procedure: IRRIGATION AND DEBRIDEMENT GREAT TOE;  Surgeon: Meredith Pel, MD;  Location: [facility];  Service: Orthopedics;   Laterality: Left;  . INCISION AND DRAINAGE Left 10/25/2018   GREAT TOE  . INCISION AND DRAINAGE OF WOUND Right 04/11/2018   Procedure: IRRIGATION AND DEBRIDEMENT WOUND right foot and right ankle;  Surgeon: Evelina Bucy, DPM;  [facility];  Service: Podiatry;  Laterality: Right;  . KNEE ARTHROSCOPY Left   . KNEE ARTHROSCOPY W/ ALLOGRAFT IMPANT Left    graft x 2  . KNEE SURGERY     TOTAL 8 SURG'S  . LAPAROSCOPIC SIGMOID COLECTOMY N/A 04/22/2015   Procedure: LAPAROSCOPIC HAND ASSISTED SIGMOID COLECTOMY;  Surgeon: Erroll Luna, MD;  Location: [facility];  Service: General;  Laterality: N/A;  . LEFT HEART CATHETERIZATION WITH CORONARY ANGIOGRAM N/A 02/12/2014   Procedure: LEFT HEART CATHETERIZATION WITH CORONARY ANGIOGRAM;  Surgeon: Candee Furbish, MD;  Location: [facility];  Service: Cardiovascular;  Laterality: N/A;   No angiographically significant CAD; normal LVSF, LVEDP 100mmHg,  EF 55% (new finding ef 30% myoview 01-08-2014)  . LUMBAR FUSION    . MANDIBLE FRACTURE SURGERY    . MULTIPLE LAPAROSCOPIES FOR ENDOMETRIOSIS    . PUBOVAGINAL SLING  12/29/2011   Procedure: Gaynelle Arabian;  Surgeon: Reece Packer, MD;  Location: [facility];  Service: Urology;  Laterality: N/A;  cysto and sparc sling   . PUBOVAGINAL SLING N/A 04/30/2013   Procedure: REMOVAL OF VAGINAL MESH;  Surgeon: Reece Packer, MD;  [facility];  Service: Urology;  Laterality: N/A;  . RECONSTURCTION OF CONGENITAL UTERUS ANOMALY  1983  . REPEAT RECONSTRUCTION ACL LEFT KNEE/ SCREWS REMOVED  03-28-2000   CADAVER GRAFT  . TOTAL ABDOMINAL HYSTERECTOMY  1997   w/BSO  . TRANSTHORACIC ECHOCARDIOGRAM  02/13/2014   ef 45%, hypokinesis base inferior and base inferolateral walls  . UPPER GI ENDOSCOPY    . WOUND DEBRIDEMENT Right 09/05/2016   Procedure: DEBRIDEMENT WOUND WITH GRAFT RIGHT FOOT;  Surgeon: Landis Martins, DPM;  Location: [facility];  Service: Podiatry;  Laterality: Right;  . WRIST FRACTURE SURGERY       Social History   Occupational History  . Not on file  Tobacco Use  . Smoking status: Current Every Day Smoker    Packs/day: 2.00    Years: 46.00    Pack years: 92.00    Types: Cigarettes  . Smokeless tobacco: Never Used  . Tobacco comment: per pt started smoking  age 15  Substance and Sexual Activity  . Alcohol use: No    Comment: 05/24/2018 "special occasions only"  . Drug use: Not Currently  . Sexual activity: Yes

## 2018-11-13 NOTE — Progress Notes (Signed)
r 

## 2018-11-14 ENCOUNTER — Other Ambulatory Visit (INDEPENDENT_AMBULATORY_CARE_PROVIDER_SITE_OTHER): Payer: Self-pay

## 2018-11-14 ENCOUNTER — Telehealth (INDEPENDENT_AMBULATORY_CARE_PROVIDER_SITE_OTHER): Payer: Self-pay | Admitting: Physician Assistant

## 2018-11-14 ENCOUNTER — Encounter (INDEPENDENT_AMBULATORY_CARE_PROVIDER_SITE_OTHER): Payer: Self-pay | Admitting: Physician Assistant

## 2018-11-14 NOTE — Telephone Encounter (Signed)
Done

## 2018-11-14 NOTE — Telephone Encounter (Signed)
Received voicemail message from Tracie from [facility] advised patient was seen yesterday and order was changed from wet to dry dressing to Silvadene. Tracie asked if the order can be faxed to her. The fax# is [phone]   The number to contact Leana Roe is [phone]

## 2018-11-15 DIAGNOSIS — N950 Postmenopausal bleeding: Secondary | ICD-10-CM | POA: Insufficient documentation

## 2018-11-20 ENCOUNTER — Encounter (INDEPENDENT_AMBULATORY_CARE_PROVIDER_SITE_OTHER): Payer: Self-pay | Admitting: Orthopedic Surgery

## 2018-11-20 ENCOUNTER — Ambulatory Visit (INDEPENDENT_AMBULATORY_CARE_PROVIDER_SITE_OTHER): Payer: Medicare Other | Admitting: Orthopedic Surgery

## 2018-11-20 VITALS — Ht 65.0 in | Wt 183.0 lb

## 2018-11-20 DIAGNOSIS — Z89511 Acquired absence of right leg below knee: Secondary | ICD-10-CM

## 2018-11-20 DIAGNOSIS — Z89412 Acquired absence of left great toe: Secondary | ICD-10-CM

## 2018-11-20 DIAGNOSIS — E1142 Type 2 diabetes mellitus with diabetic polyneuropathy: Secondary | ICD-10-CM

## 2018-11-20 DIAGNOSIS — Z794 Long term (current) use of insulin: Secondary | ICD-10-CM

## 2018-11-20 NOTE — Progress Notes (Signed)
Office Visit Note   Patient: [name]           Date of Birth: 1966-10-19           MRN: [id number] Visit Date: 11/20/2018              Requested by: Tamsen Roers, [place], [place] [address] PCP: Tamsen Roers, MD  Chief Complaint  Patient presents with  . Left Foot - Routine Post Op    10/26/18 1st ray amputation   . Right Leg - Follow-up    BKA 03/2018      HPI: Patient is a 52 year old woman diabetic insensate neuropathy with a right transtibial amputation status post first ray amputation left foot.  She is full weightbearing in a fracture boot he states the dressing only gets changed twice a week with  Assessment & Plan: Visit Diagnoses:  1. 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 Acquired absence of right leg below knee (HCC)   3. Type 2 diabetes mellitus with diabetic polyneuropathy, with long-term current use of insulin ([place])     Plan: Again reinforced the importance of minimizing weightbearing.  Patient is at risk of complete wound dehiscence.  Patient request a note to return to Dr. Rex Kras for pain management.  Patient states she left Dr. Eddie Dibbles office on her own.  A note was provided.  Follow-Up Instructions: Return in about 2 weeks (around 12/04/2018).   Ortho Exam  Patient is alert, oriented, no adenopathy, well-dressed, normal affect, normal respiratory effort. Examination patient has some redness around the surgical incision there is slight wound dehiscence with about a millimeter gaping of the incision.  There is no ascending cellulitis no purulent drainage.  Iodosorb 4 x 4 and Ace wrap was applied she was placed back in her fracture boot.  Imaging: No results found. No images are attached to the encounter.  Labs: Lab Results  Component Value Date   HGBA1C 10.6 (H) 07/03/2018   HGBA1C 9.2 03/19/2018   HGBA1C 10.0 12/11/2017   ESRSEDRATE 55 (H) 02/19/2016   CRP 17.4 (H) 02/19/2016   LABURIC 5.8 02/19/2016   REPTSTATUS 07/04/2018 FINAL 07/03/2018   GRAMSTAIN  04/11/2018    RARE WBC PRESENT, PREDOMINANTLY PMN NO ORGANISMS SEEN    CULT  07/03/2018    NO GROWTH Performed at [facility], [place] [address], [place], [place] [address]    LABORGA ESCHERICHIA COLI (A) 05/24/2018     Lab Results  Component Value Date   ALBUMIN 3.3 (L) 07/03/2018   ALBUMIN 3.3 (L) 06/28/2018   ALBUMIN 3.8 05/22/2018   LABURIC 5.8 02/19/2016    Body mass index is 30.45 kg/m.  Orders:  No orders of the defined types were placed in this encounter.  No orders of the defined types were placed in this encounter.    Procedures: No procedures performed  Clinical Data: No additional findings.  ROS:  All other systems negative, except as noted in the HPI. Review of Systems  Objective: Vital Signs: Ht 5\' 5"  (1.651 m)   Wt 183 lb (83 kg)   BMI 30.45 kg/m   Specialty Comments:  No specialty comments available.  PMFS History: Patient Active Problem List   Diagnosis Date Noted  . Dog bite of foot, sequela   . Status post surgery 10/25/2018  . Left foot infection 10/25/2018  . Cellulitis of left upper extremity   . Poorly controlled diabetes mellitus (  [place])   . UTI (urinary tract infection) 05/23/2018  . Acute metabolic encephalopathy 62/70/3500  . Toxic encephalopathy 05/09/2018  . GERD (gastroesophageal reflux disease) 05/09/2018  . Tobacco abuse 05/09/2018  . Polypharmacy 05/09/2018  . Restless leg syndrome 04/27/2018  . Anxiety 04/27/2018  . Hyponatremia 04/26/2018  . Noncompliance with dietary restriction 04/26/2018  . Postoperative cellulitis of surgical wound 04/26/2018  . At high risk for falls 04/24/2018  . Noncompliance with safety precautions 04/24/2018  . Leukocytosis 04/24/2018  . Unilateral complete BKA ([place]) 04/16/2018  . PAD (peripheral artery disease) ([place])   . Poorly controlled type 2 diabetes mellitus ([place])   . Subacute osteomyelitis, right ankle and foot ([place])   .  Major depressive disorder, recurrent episode ([place]) 04/11/2018  . Diabetic ulcer of right midfoot associated with type 2 diabetes mellitus, with necrosis of bone ([place])   . Abscess of ankle   . Infectious synovitis   . Diabetic foot infection ([place])   . Sepsis ([place]) 04/09/2018  . Cancer of sigmoid colon ([place]) 03/09/2015  . OSA (obstructive sleep apnea) 07/04/2014  . Migraine without aura, with intractable migraine, so stated, without mention of status migrainosus 03/26/2014  . Peripheral neuropathy 03/03/2014  . Abnormal stress test 02/12/2014  . CVA (cerebral infarction) 02/12/2014  . Chest pain   . Abnormal heart rhythm   . Edema   . Hypertension   . Hyperlipemia   . Hypercholesterolemia   . Other and unspecified hyperlipidemia 11/14/2013  . Type II diabetes mellitus, uncontrolled ([place]) 07/22/2013  . Essential hypertension 07/22/2013  . Diabetic neuropathy with neurologic complication ([place]) 93/81/8299  . Diabetic polyneuropathy ([place]) 07/22/2013   Past Medical History:  Diagnosis Date  . Anxiety   . Arthritis   . Bipolar disorder ([place])   . Broken jaw ([place])   . Broken wrist   . Chronic back pain   . Claudication ([place])    a. 12/2013 ABI's: R 0.97, L 0.94.  [name] COPD (chronic obstructive pulmonary disease) ([place])   . Depression   . Diabetic Charcot's foot ([place])   . Diabetic foot ulcer (HCC)    chronic left foot ulcer , great toe/  hx recurrent foot ulcer bilaterally  . DJD (degenerative joint disease)   . Edema of both lower extremities   . Episode of memory loss   . GERD (gastroesophageal reflux disease)   . Headache(784.0)   . Hiatal hernia   . History of colon cancer, stage I dx 02-26-2015--- oncologist-- dr [name]--- per last note no recurrence   05-16-2015 s/p  Laparoscopic sigmoid colectomy w/ node bx's (negative nodes per path)  Stage I (pT1,N0,M0) Grade 2  . 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   per MRI multiple small strokes post cardiac  cath. --  residual mild memory loss  . History of methicillin resistant staphylococcus aureus (MRSA)   . Hypercholesterolemia   . Insomnia   . Insulin dependent type 2 diabetes mellitus, uncontrolled ([place]) dx 2004   endocrinologist-  dr Dwyane Dee-- last A1c 8.8 in Aug2018:  pt is noncompliant w/ diet, states does note eat breakfast , her first main meal in afternoon  . Neurogenic bladder   . Neuropathy, diabetic ([place])    hands and feet  . OSA (obstructive sleep apnea)    cpap intolerant  . Personality disorder ([place])   . Restless leg syndrome   . SOB (shortness of breath) on exertion   . Stroke ([place])   . SUI (  stress urinary incontinence, female) S/P SLING 12-29-2011    Family History  Problem Relation Age of Onset  . Hypertension Mother   . Diabetes Mother   . Cancer - Other Mother        lymphoma   . Cancer - Other Father        lung, bladder cancer   . Heart attack Father   . Cancer - Other Brother        bladder cancer     Past Surgical History:  Procedure Laterality Date  . AMPUTATION Right 04/13/2018   Procedure: RIGHT BELOW KNEE AMPUTATION;  Surgeon: Newt Minion, MD;  Location: [facility];  Service: Orthopedics;  Laterality: Right;  . AMPUTATION Left 10/26/2018   Procedure: LEFT FOOT FIRST RAY AMPUTATION;  Surgeon: Newt Minion, MD;  Location: [facility];  Service: Orthopedics;  Laterality: Left;  . ANTERIOR CERVICAL DECOMP/DISCECTOMY FUSION  2000   C5 - 7  . APPLICATION OF WOUND VAC Left 10/26/2018   Procedure: APPLICATION OF WOUND VAC;  Surgeon: Newt Minion, MD;  Location: [facility];  Service: Orthopedics;  Laterality: Left;  . BACK SURGERY    . CARDIAC CATHETERIZATION  05-22-2008   DR SKAINS   NO SIGNIFECANT CAD/ NORMAL LV/  EF 65%/  NO WALL MOTION ABNORMALITIES  . CARPAL TUNNEL RELEASE Right 04-25-2013  . CATARACT EXTRACTION Right 10/24/2018  . [place]; 1992  . COLON SURGERY    . COLONOSCOPY    . CYSTO N/A 04/30/2013   Procedure: CYSTOSCOPY;  Surgeon: Reece Packer, MD;  [facility];  Service: Urology;  Laterality: N/A;  . CYSTOSCOPY MACROPLASTIQUE IMPLANT N/A 02/06/2018   Procedure: CYSTOSCOPY MACROPLASTIQUE IMPLANT;  Surgeon: Bjorn Loser, MD;  Location[facility];  Service: Urology;  Laterality: N/A;  . CYSTOSCOPY WITH INJECTION  05/04/2012   Procedure: CYSTOSCOPY WITH INJECTION;  Surgeon: Reece Packer, MD;  Location: [facility];  Service: Urology;  Laterality: N/A;  MACROPLASTIQUE INJECTION  . CYSTOSCOPY WITH INJECTION  08/28/2012   Procedure: CYSTOSCOPY WITH INJECTION;  Surgeon: Reece Packer, MD;  Location: [facility];  Service: Urology;  Laterality: N/A;  cysto and macroplastique   . FOOT SURGERY Bilateral    "related to [place]"  . HERNIA REPAIR  ?1996   "stomach"  . I&D EXTREMITY Left 10/25/2018   Procedure: IRRIGATION AND DEBRIDEMENT GREAT TOE;  Surgeon: Meredith Pel, MD;  Location: [facility];  Service: Orthopedics;  Laterality: Left;  . INCISION AND DRAINAGE Left 10/25/2018   GREAT TOE  . INCISION AND DRAINAGE OF WOUND Right 04/11/2018   Procedure: IRRIGATION AND DEBRIDEMENT WOUND right foot and right ankle;  Surgeon: Evelina Bucy, DPM;  [facility];  Service: Podiatry;  Laterality: Right;  . KNEE ARTHROSCOPY Left   . KNEE ARTHROSCOPY W/ ALLOGRAFT IMPANT Left    graft x 2  . KNEE SURGERY     TOTAL 8 SURG'S  . LAPAROSCOPIC SIGMOID COLECTOMY N/A 04/22/2015   Procedure: LAPAROSCOPIC HAND ASSISTED SIGMOID COLECTOMY;  Surgeon: Erroll Luna, MD;  Location: [facility];  Service: General;  Laterality: N/A;  . LEFT HEART CATHETERIZATION WITH CORONARY ANGIOGRAM N/A 02/12/2014   Procedure: LEFT HEART CATHETERIZATION WITH CORONARY ANGIOGRAM;  Surgeon: Candee Furbish, MD;  Location[facility] LAB;  Service: Cardiovascular;  Laterality: N/A;   No angiographically significant CAD; normal LVSF, LVEDP 25mmHg,  EF 55% (new finding ef 30% myoview 01-08-2014)  . LUMBAR FUSION    .  MANDIBLE FRACTURE SURGERY    . MULTIPLE LAPAROSCOPIES FOR ENDOMETRIOSIS    . PUBOVAGINAL SLING  12/29/2011   Procedure: Gaynelle Arabian;  Surgeon: Reece Packer, MD;  Location: [facility];  Service: Urology;  Laterality: N/A;  cysto and sparc sling   . PUBOVAGINAL SLING N/A 04/30/2013   Procedure: REMOVAL OF VAGINAL MESH;  Surgeon: Reece Packer, MD;  [facility];  Service: Urology;  Laterality: N/A;  . RECONSTURCTION OF CONGENITAL UTERUS ANOMALY  1983  . REPEAT RECONSTRUCTION ACL LEFT KNEE/ SCREWS REMOVED  03-28-2000   CADAVER GRAFT  . TOTAL ABDOMINAL HYSTERECTOMY  1997   w/BSO  . TRANSTHORACIC ECHOCARDIOGRAM  02/13/2014   ef 45%, hypokinesis base inferior and base inferolateral walls  . UPPER GI ENDOSCOPY    . WOUND DEBRIDEMENT Right 09/05/2016   Procedure: DEBRIDEMENT WOUND WITH GRAFT RIGHT FOOT;  Surgeon: Landis Martins, DPM;  Location: [facility];  Service: Podiatry;  Laterality: Right;  . WRIST FRACTURE SURGERY     Social History   Occupational History  . Not on file  Tobacco Use  . Smoking status: Current Every Day Smoker    Packs/day: 2.00    Years: 46.00    Pack years: 92.00    Types: Cigarettes  . Smokeless tobacco: Never Used  . Tobacco comment: per pt started smoking  age 57  Substance and Sexual Activity  . Alcohol use: No    Comment: 05/24/2018 "special occasions only"  . Drug use: Not Currently  . Sexual activity: Yes

## 2018-12-01 ENCOUNTER — Other Ambulatory Visit: Payer: Self-pay | Admitting: Sports Medicine

## 2018-12-04 ENCOUNTER — Encounter (INDEPENDENT_AMBULATORY_CARE_PROVIDER_SITE_OTHER): Payer: Self-pay | Admitting: Orthopedic Surgery

## 2018-12-04 ENCOUNTER — Ambulatory Visit (INDEPENDENT_AMBULATORY_CARE_PROVIDER_SITE_OTHER): Payer: Medicare Other | Admitting: Orthopedic Surgery

## 2018-12-04 ENCOUNTER — Other Ambulatory Visit: Payer: Self-pay | Admitting: Rehabilitation

## 2018-12-04 VITALS — Ht 65.0 in | Wt 183.0 lb

## 2018-12-04 DIAGNOSIS — Z89412 Acquired absence of left great toe: Secondary | ICD-10-CM

## 2018-12-04 DIAGNOSIS — M542 Cervicalgia: Secondary | ICD-10-CM

## 2018-12-04 NOTE — Progress Notes (Signed)
Office Visit Note   Patient: [name]           Date of Birth: 18-Apr-1966           MRN: [id number] Visit Date: 12/04/2018              Requested by: Tamsen Roers, [place], [place] [address] PCP: Tamsen Roers, MD  Chief Complaint  Patient presents with  . Right Leg - Follow-up    BKA 03/2018  . Left Foot - Routine Post Op    1st Ray amp      HPI: Patient is a 52 year old woman who presents in follow-up for left foot first ray amputation.  She states she has some pain denies any swelling states she has a small open area distally with some small amount of drainage.  Assessment & Plan: Visit Diagnoses:  1. 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  Follow-Up Instructions: Return in about 1 week (around 12/11/2018).   Ortho Exam  Patient is alert, oriented, no adenopathy, well-dressed, normal affect, normal respiratory effort. Examination the incision is well-healed proximally the sutures are harvested distally there is an area of granulation tissue 5 mm in diameter 1 mm deep there is no exposed bone or tendon.  Iodosorb and a Band-Aid was applied.  Recommended following up with her podiatrist for orthotics.  Imaging: No results found. No images are attached to the encounter.  Labs: Lab Results  Component Value Date   HGBA1C 10.6 (H) 07/03/2018   HGBA1C 9.2 03/19/2018   HGBA1C 10.0 12/11/2017   ESRSEDRATE 55 (H) 02/19/2016   CRP 17.4 (H) 02/19/2016   LABURIC 5.8 02/19/2016   REPTSTATUS 07/04/2018 FINAL 07/03/2018   GRAMSTAIN  04/11/2018    RARE WBC PRESENT, PREDOMINANTLY PMN NO ORGANISMS SEEN    CULT  07/03/2018    NO GROWTH Performed at [facility], [place] [address] [place], [place] [address]    LABORGA ESCHERICHIA COLI (A) 05/24/2018     Lab Results  Component  Value Date   ALBUMIN 3.3 (L) 07/03/2018   ALBUMIN 3.3 (L) 06/28/2018   ALBUMIN 3.8 05/22/2018   LABURIC 5.8 02/19/2016    Body mass index is 30.45 kg/m.  Orders:  No orders of the defined types were placed in this encounter.  No orders of the defined types were placed in this encounter.    Procedures: No procedures performed  Clinical Data: No additional findings.  ROS:  All other systems negative, except as noted in the HPI. Review of Systems  Objective: Vital Signs: Ht 5\' 5"  (1.651 m)   Wt 183 lb (83 kg)   BMI 30.45 kg/m   Specialty Comments:  No specialty comments available.  PMFS History: Patient Active Problem List   Diagnosis Date Noted  . Dog bite of foot, sequela   . Status post surgery 10/25/2018  . Left foot infection 10/25/2018  . Cellulitis of left upper extremity   . Poorly controlled diabetes mellitus ([place])   . UTI (urinary tract infection) 05/23/2018  . Acute metabolic encephalopathy 86[birth year]76/1950  . Toxic encephalopathy 05/09/2018  . GERD (gastroesophageal reflux disease) 05/09/2018  . Tobacco abuse 05/09/2018  . Polypharmacy 05/09/2018  . Restless leg syndrome 04/27/2018  . Anxiety 04/27/2018  . Hyponatremia 04/26/2018  . Noncompliance with dietary restriction 04/26/2018  .  Postoperative cellulitis of surgical wound 04/26/2018  . At high risk for falls 04/24/2018  . Noncompliance with safety precautions 04/24/2018  . Leukocytosis 04/24/2018  . 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 PAD (peripheral artery disease) ([place])   . Poorly controlled type 2 diabetes mellitus ([place])   . Subacute osteomyelitis, right ankle and foot ([place])   . Major depressive disorder, recurrent episode ([place]) 04/11/2018  . Diabetic ulcer of right midfoot associated with type 2 diabetes mellitus, with necrosis of bone ([place])   . Abscess of ankle   . Infectious synovitis   . Diabetic foot infection ([place])   . Sepsis ([place]) 04/09/2018  . Cancer of sigmoid colon ([place])  03/09/2015  . OSA (obstructive sleep apnea) 07/04/2014  . Migraine without aura, with intractable migraine, so stated, without mention of status migrainosus 03/26/2014  . Peripheral neuropathy 03/03/2014  . Abnormal stress test 02/12/2014  . CVA (cerebral infarction) 02/12/2014  . Chest pain   . Abnormal heart rhythm   . Edema   . Hypertension   . Hyperlipemia   . Hypercholesterolemia   . Other and unspecified hyperlipidemia 11/14/2013  . Type II diabetes mellitus, uncontrolled ([place]) 07/22/2013  . Essential hypertension 07/22/2013  . Diabetic neuropathy with neurologic complication ([place]) 50/35/4656  . Diabetic polyneuropathy ([place]) 07/22/2013   Past Medical History:  Diagnosis Date  . Anxiety   . Arthritis   . Bipolar disorder ([place])   . Broken jaw ([place])   . Broken wrist   . Chronic back pain   . Claudication ([place])    a. 12/2013 ABI's: R 0.97, L 0.94.  [name] COPD (chronic obstructive pulmonary disease) ([place])   . Depression   . Diabetic Charcot's foot ([place])   . Diabetic foot ulcer (HCC)    chronic left foot ulcer , great toe/  hx recurrent foot ulcer bilaterally  . DJD (degenerative joint disease)   . Edema of both lower extremities   . Episode of memory loss   . GERD (gastroesophageal reflux disease)   . Headache(784.0)   . Hiatal hernia   . History of colon cancer, stage I dx 02-26-2015--- oncologist-- dr [name]--- per last note no recurrence   05-16-2015 s/p  Laparoscopic sigmoid colectomy w/ node bx's (negative nodes per path)  Stage I (pT1,N0,M0) Grade 2  . 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   per MRI multiple small strokes post cardiac cath. --  residual mild memory loss  . History of methicillin resistant staphylococcus aureus (MRSA)   . Hypercholesterolemia   . Insomnia   . Insulin dependent type 2 diabetes mellitus, uncontrolled ([place]) dx 2004   endocrinologist-  dr Dwyane Dee-- last A1c 8.8 in Aug2018:  pt is noncompliant w/ diet, states  does note eat breakfast , her first main meal in afternoon  . Neurogenic bladder   . Neuropathy, diabetic ([place])    hands and feet  . OSA (obstructive sleep apnea)    cpap intolerant  . Personality disorder ([place])   . Restless leg syndrome   . SOB (shortness of breath) on exertion   . Stroke ([place])   . SUI (stress urinary incontinence, female) S/P SLING 12-29-2011    Family History  Problem Relation Age of Onset  . Hypertension Mother   . Diabetes Mother   . Cancer - Other Mother        lymphoma   . Cancer - Other Father        lung, bladder cancer   .  Heart attack Father   . Cancer - Other Brother        bladder cancer     Past Surgical History:  Procedure Laterality Date  . AMPUTATION Right 04/13/2018   Procedure: RIGHT BELOW KNEE AMPUTATION;  Surgeon: Newt Minion, MD;  Location: [facility];  Service: Orthopedics;  Laterality: Right;  . AMPUTATION Left 10/26/2018   Procedure: LEFT FOOT FIRST RAY AMPUTATION;  Surgeon: Newt Minion, MD;  Location: [facility];  Service: Orthopedics;  Laterality: Left;  . ANTERIOR CERVICAL DECOMP/DISCECTOMY FUSION  2000   C5 - 7  . APPLICATION OF WOUND VAC Left 10/26/2018   Procedure: APPLICATION OF WOUND VAC;  Surgeon: Newt Minion, MD;  Location: [facility];  Service: Orthopedics;  Laterality: Left;  . BACK SURGERY    . CARDIAC CATHETERIZATION  05-22-2008   DR SKAINS   NO SIGNIFECANT CAD/ NORMAL LV/  EF 65%/  NO WALL MOTION ABNORMALITIES  . CARPAL TUNNEL RELEASE Right 04-25-2013  . CATARACT EXTRACTION Right 10/24/2018  . [place]; 1992  . COLON SURGERY    . COLONOSCOPY    . CYSTO N/A 04/30/2013   Procedure: CYSTOSCOPY;  Surgeon: Reece Packer, MD;  Location: [facility];  Service: Urology;  Laterality: N/A;  . CYSTOSCOPY MACROPLASTIQUE IMPLANT N/A 02/06/2018   Procedure: CYSTOSCOPY MACROPLASTIQUE IMPLANT;  Surgeon: Bjorn Loser, MD;  Location: [facility];  Service: Urology;  Laterality: N/A;  . CYSTOSCOPY WITH INJECTION   05/04/2012   Procedure: CYSTOSCOPY WITH INJECTION;  Surgeon: Reece Packer, MD;  Location: [place];  Service: Urology;  Laterality: N/A;  MACROPLASTIQUE INJECTION  . CYSTOSCOPY WITH INJECTION  08/28/2012   Procedure: CYSTOSCOPY WITH INJECTION;  Surgeon: Reece Packer, MD;  Location: [facility];  Service: Urology;  Laterality: N/A;  cysto and macroplastique   . FOOT SURGERY Bilateral    "related to [place]"  . HERNIA REPAIR  ?1996   "stomach"  . I&D EXTREMITY Left 10/25/2018   Procedure: IRRIGATION AND DEBRIDEMENT GREAT TOE;  Surgeon: Meredith Pel, MD;  Location: [facility];  Service: Orthopedics;  Laterality: Left;  . INCISION AND DRAINAGE Left 10/25/2018   GREAT TOE  . INCISION AND DRAINAGE OF WOUND Right 04/11/2018   Procedure: IRRIGATION AND DEBRIDEMENT WOUND right foot and right ankle;  Surgeon: Evelina Bucy, DPM;  Location: [facility];  Service: Podiatry;  Laterality: Right;  . KNEE ARTHROSCOPY Left   . KNEE ARTHROSCOPY W/ ALLOGRAFT IMPANT Left    graft x 2  . KNEE SURGERY     TOTAL 8 SURG'S  . LAPAROSCOPIC SIGMOID COLECTOMY N/A 04/22/2015   Procedure: LAPAROSCOPIC HAND ASSISTED SIGMOID COLECTOMY;  Surgeon: Erroll Luna, MD;  Location: [facility];  Service: General;  Laterality: N/A;  . LEFT HEART CATHETERIZATION WITH CORONARY ANGIOGRAM N/A 02/12/2014   Procedure: LEFT HEART CATHETERIZATION WITH CORONARY ANGIOGRAM;  Surgeon: Candee Furbish, MD;  Location: [facility];  Service: Cardiovascular;  Laterality: N/A;   No angiographically significant CAD; normal LVSF, LVEDP 43mmHg,  EF 55% (new finding ef 30% myoview 01-08-2014)  . LUMBAR FUSION    . MANDIBLE FRACTURE SURGERY    . MULTIPLE LAPAROSCOPIES FOR ENDOMETRIOSIS    . PUBOVAGINAL SLING  12/29/2011   Procedure: Gaynelle Arabian;  Surgeon: Reece Packer, MD;  Location[facility];  Service: Urology;  Laterality: N/A;  cysto and sparc sling   . PUBOVAGINAL SLING N/A 04/30/2013    Procedure: REMOVAL OF  VAGINAL MESH;  Surgeon: Reece Packer, MD;  Location: [facility];  Service: Urology;  Laterality: N/A;  . RECONSTURCTION OF CONGENITAL UTERUS ANOMALY  1983  . REPEAT RECONSTRUCTION ACL LEFT KNEE/ SCREWS REMOVED  03-28-2000   CADAVER GRAFT  . TOTAL ABDOMINAL HYSTERECTOMY  1997   w/BSO  . TRANSTHORACIC ECHOCARDIOGRAM  02/13/2014   ef 45%, hypokinesis base inferior and base inferolateral walls  . UPPER GI ENDOSCOPY    . WOUND DEBRIDEMENT Right 09/05/2016   Procedure: DEBRIDEMENT WOUND WITH GRAFT RIGHT FOOT;  Surgeon: Landis Martins, DPM;  Location: [place] [facility] [place];  Service: Podiatry;  Laterality: Right;  . WRIST FRACTURE SURGERY     Social History   Occupational History  . Not on file  Tobacco Use  . Smoking status: Current Every Day Smoker    Packs/day: 2.00    Years: 46.00    Pack years: 92.00    Types: Cigarettes  . Smokeless tobacco: Never Used  . Tobacco comment: per pt started smoking  age 89  Substance and Sexual Activity  . Alcohol use: No    Comment: 05/24/2018 "special occasions only"  . Drug use: Not Currently  . Sexual activity: Yes

## 2018-12-05 ENCOUNTER — Telehealth: Payer: Self-pay

## 2018-12-05 NOTE — Telephone Encounter (Signed)
Spoke with patient to go over her allergies and medications prior to being scheduled for her myelogram.  She states an understanding to hold Vraylar and Seroquel for 48 hours before and 24 hours after the procedure, that she will be on strict bedrest for 24 hours, need a driver and will be here 2-2.5 hours.  Brita Romp, RN

## 2018-12-13 ENCOUNTER — Ambulatory Visit: Payer: Self-pay | Admitting: Endocrinology

## 2018-12-14 ENCOUNTER — Ambulatory Visit
Admission: RE | Admit: 2018-12-14 | Discharge: 2018-12-14 | Disposition: A | Discharge status: Home / Self Care | Payer: Medicare Other | Source: Ambulatory Visit | Attending: Rehabilitation | Admitting: Rehabilitation

## 2018-12-14 DIAGNOSIS — M542 Cervicalgia: Secondary | ICD-10-CM

## 2018-12-14 MED ORDER — DIAZEPAM 5 MG PO TABS
10.0000 mg | ORAL_TABLET | Freq: Once | ORAL | Status: AC
  Administered 2018-12-14: 10 mg via ORAL

## 2018-12-14 MED ORDER — IOPAMIDOL (ISOVUE-M 300) INJECTION 61%
10.0000 mL | Freq: Once | INTRAMUSCULAR | Status: AC | PRN
  Administered 2018-12-14: 10 mL via INTRATHECAL

## 2018-12-14 MED ORDER — ONDANSETRON HCL 4 MG/2ML IJ SOLN
4.0000 mg | Freq: Once | INTRAMUSCULAR | Status: AC
  Administered 2018-12-14: 4 mg via INTRAMUSCULAR

## 2018-12-14 MED ORDER — MEPERIDINE HCL 100 MG/ML IJ SOLN
75.0000 mg | Freq: Once | INTRAMUSCULAR | Status: AC
  Administered 2018-12-14: 75 mg via INTRAMUSCULAR

## 2018-12-14 NOTE — Discharge Instructions (Signed)
Myelogram Discharge Instructions  1. Go home and rest quietly for the next 24 hours.  It is important to lie flat for the next 24 hours.  Get up only to go to the restroom.  You may lie in the bed or on a couch on your back, your stomach, your left side or your right side.  You may have one pillow under your head.  You may have pillows between your knees while you are on your side or under your knees while you are on your back.  2. DO NOT drive today.  Recline the seat as far back as it will go, while still wearing your seat belt, on the way home.  3. You may get up to go to the bathroom as needed.  You may sit up for 10 minutes to eat.  You may resume your normal diet and medications unless otherwise indicated.  Drink lots of extra fluids today and tomorrow.  4. The incidence of headache, nausea, or vomiting is about 5% (one in 20 patients).  If you develop a headache, lie flat and drink plenty of fluids until the headache goes away.  Caffeinated beverages may be helpful.  If you develop severe nausea and vomiting or a headache that does not go away with flat bed rest, call [phone].  5. You may resume normal activities after your 24 hours of bed rest is over; however, do not exert yourself strongly or do any heavy lifting tomorrow. If when you get up you have a headache when standing, go back to bed and force fluids for another 24 hours.  6. Call your physician for a follow-up appointment.  The results of your myelogram will be sent directly to your physician by the following day.  7. If you have any questions or if complications develop after you arrive home, please call [phone].  Discharge instructions have been explained to the patient.  The patient, or the person responsible for the patient, fully understands these instructions.   YOU MAY RESTART YOUR SEROQUEL AND VRAYLAR TOMORROW 12/14/2018 AT 1:00PM.

## 2018-12-14 NOTE — Progress Notes (Signed)
Patient states she has been off Vraylar and Seroquel for at least the past 48 hours.

## 2019-01-01 ENCOUNTER — Ambulatory Visit (INDEPENDENT_AMBULATORY_CARE_PROVIDER_SITE_OTHER): Payer: Medicare Other | Admitting: Orthopedic Surgery

## 2019-01-08 ENCOUNTER — Ambulatory Visit (INDEPENDENT_AMBULATORY_CARE_PROVIDER_SITE_OTHER): Payer: Medicare Other | Admitting: Endocrinology

## 2019-01-08 ENCOUNTER — Telehealth (INDEPENDENT_AMBULATORY_CARE_PROVIDER_SITE_OTHER): Payer: Self-pay | Admitting: Orthopedic Surgery

## 2019-01-08 ENCOUNTER — Encounter: Payer: Self-pay | Admitting: Endocrinology

## 2019-01-08 VITALS — BP 122/76 | HR 82 | Ht 65.0 in

## 2019-01-08 DIAGNOSIS — E876 Hypokalemia: Secondary | ICD-10-CM

## 2019-01-08 DIAGNOSIS — E1142 Type 2 diabetes mellitus with diabetic polyneuropathy: Secondary | ICD-10-CM

## 2019-01-08 DIAGNOSIS — E871 Hypo-osmolality and hyponatremia: Secondary | ICD-10-CM

## 2019-01-08 DIAGNOSIS — Z794 Long term (current) use of insulin: Secondary | ICD-10-CM

## 2019-01-08 DIAGNOSIS — E1165 Type 2 diabetes mellitus with hyperglycemia: Secondary | ICD-10-CM

## 2019-01-08 LAB — URINALYSIS, ROUTINE W REFLEX MICROSCOPIC
BILIRUBIN URINE: NEGATIVE
Bacteria, UA: NONE SEEN
Hgb urine dipstick: NEGATIVE
Ketones, ur: NEGATIVE
NITRITE: NEGATIVE
RBC / HPF: NONE SEEN (ref 0–?)
Specific Gravity, Urine: 1.01 (ref 1.000–1.030)
Total Protein, Urine: NEGATIVE
Urine Glucose: 100 — AB
Urobilinogen, UA: 0.2 (ref 0.0–1.0)
pH: 7 (ref 5.0–8.0)

## 2019-01-08 LAB — COMPREHENSIVE METABOLIC PANEL
ALK PHOS: 77 U/L (ref 39–117)
ALT: 10 U/L (ref 0–35)
AST: 14 U/L (ref 0–37)
Albumin: 3.5 g/dL (ref 3.5–5.2)
BUN: 7 mg/dL (ref 6–23)
CO2: 30 mEq/L (ref 19–32)
Calcium: 9.2 mg/dL (ref 8.4–10.5)
Chloride: 103 mEq/L (ref 96–112)
Creatinine, Ser: 0.61 mg/dL (ref 0.40–1.20)
GFR: 108.79 mL/min (ref >60.00)
Glucose, Bld: 166 mg/dL — ABNORMAL HIGH (ref 70–99)
Potassium: 4.2 mEq/L (ref 3.5–5.1)
Sodium: 141 mEq/L (ref 135–145)
Total Bilirubin: 0.2 mg/dL (ref 0.2–1.2)
Total Protein: 6.2 g/dL (ref 6.0–8.3)

## 2019-01-08 LAB — POCT GLYCOSYLATED HEMOGLOBIN (HGB A1C): Hemoglobin A1C: 9.2 % — AB (ref 4.0–5.6)

## 2019-01-08 LAB — MICROALBUMIN / CREATININE URINE RATIO
Creatinine,U: 54.4 mg/dL
Microalb Creat Ratio: 1.9 mg/g (ref 0.0–30.0)
Microalb, Ur: 1.1 mg/dL (ref 0.0–1.9)

## 2019-01-08 LAB — GLUCOSE, POCT (MANUAL RESULT ENTRY): POC Glucose: 174 mg/dl — AB (ref 70–99)

## 2019-01-08 MED ORDER — EMPAGLIFLOZIN 10 MG PO TABS: 10.0000 mg | ORAL_TABLET | Freq: Every day | ORAL | 3 refills | Status: DC

## 2019-01-08 NOTE — Progress Notes (Signed)
Patient ID: [name], female   DOB: 12-23-1966, 53 y.o.   MRN: [id number]   Reason for Appointment: Diabetes follow-up    History of Present Illness:   Diagnosis: Type 2 diabetes mellitus, date of diagnosis: 2004.  PAST history: She has been treated mostly with insulin since about a year after diagnosis. She has had difficulty with consistent compliance with diet and also compliance with self care including glucose monitoring over the years. She had been mostly treated with basal insulin. Also had been tried on mealtime insulin but she would be noncompliant with this. Was tried on Prandin for mealtime control but difficult to judge efficacy because of lack of postprandial monitoring. Was given Victoza to start in 2011 but did not follow up after this.She was tried on premixed insulin but this did not help her control, mostly because of noncompliance with the doses. She had been using the V- go pump and had better control initially and was better compliant with the daily routine and boluses. She has had frequent education visits also. She stopped using her V.-go pump in 6/15 because of discomfort at the site of application, mostly was using this on her abdomen Also did not improve with a trial of Victoza   RECENT history:    INSULIN dose: Basaglar 22 units in am.  NovoLog 0-6 with meals  Non-insulin hypoglycemic drugs: None  Her A1c is 9.2, previous range 8.8-10.6%  Has not been seen in follow-up since 02/2018  Current management, problems identified:   She thinks she is taking her Basaglar regularly but previously has been taking as much as 36 or 40 units and apparently her dose was reduced in the hospital  Also she was taking significantly larger doses of NovoLog but now is taking it only sporadically and no more than 6 units  Although she is cutting back on how much sweet tea she is drinking she is still drinking at least 24 ounces a day, previously drinking more than  a gallon a day  Also will drink juice  She is checking her blood sugars probably once a week and does not know whether that the strips are expired or not  Her diet has been erratic and eating sporadically  She stopped her Jardiance because she thought it was causing bladder infections but previously had taken Invokana long-term  She still has UTI periodically including last month treated by PCP with Cipro   Glucometer:  FreeStyle.   Blood Glucose readings by recall: 200- 422   Food preferences: eating Mostly 1 meal per day around 6-8 pm, variable intake, sandwiches at times or otherwise snacks, still getting regular drinks and juices  Physical activity: exercise: Minimal.  She has difficulties with leg pain and weakness  Certified Diabetes Educator visit: Most recent:, 2/14.     Wt Readings from Last 3 Encounters:  12/04/18 183 lb (83 kg)  11/20/18 183 lb (83 kg)  11/13/18 183 lb 10.2 oz (83.3 kg)   DM labs:   Lab Results  Component Value Date   HGBA1C 9.2 (A) 01/08/2019   HGBA1C 10.6 (H) 07/03/2018   HGBA1C 9.2 03/19/2018   Lab Results  Component Value Date   MICROALBUR 0.9 10/09/2017   LDLCALC 37 04/12/2018   CREATININE 0.68 10/25/2018       Other active problems: See review of systems    Allergies as of 01/08/2019      Reactions   Latex Itching, Rash, Other (See Comments)   Pt  states she cannot use condoms - cause an infection.  Use of latex on skin is okay for short period of time.  Tape causes rash   Sweetening Enhancer [flavoring Agent] Nausea And Vomiting, Other (See Comments)   HEADACHES   Aspartame And Phenylalanine Nausea And Vomiting   HEADACHES   Ibuprofen Other (See Comments)   HEADACHES   Trazodone And Nefazodone Other (See Comments)   Hallucinations      Medication List       Accurate as of January 08, 2019  3:44 PM. Always use your most recent med list.        aspirin-acetaminophen-caffeine 250-250-65 MG tablet Commonly known as:   EXCEDRIN MIGRAINE Take 2 tablets by mouth every 6 (six) hours as needed for headache.   BASAGLAR KWIKPEN 100 UNIT/ML Sopn Inject 0.22 mLs (22 Units total) into the skin daily.   clonazePAM 1 MG tablet Commonly known as:  KLONOPIN Take 1 mg by mouth 2 (two) times daily.   diazepam 5 MG tablet Commonly known as:  VALIUM Take 5 mg by mouth 2 (two) times daily.   empagliflozin 10 MG Tabs tablet Commonly known as:  JARDIANCE Take 10 mg by mouth daily with breakfast.   esomeprazole 40 MG capsule Commonly known as:  NEXIUM Take 40 mg by mouth daily.   gabapentin 600 MG tablet Commonly known as:  NEURONTIN Take 1 tablet (600 mg total) by mouth 2 (two) times daily. Take in the morning and afternoon. Do not take at night time.   glucose blood test strip Commonly known as:  FREESTYLE TEST STRIPS USE TO CHECK BLOOD SUGARS DAILY. DX CODE E11.65   HETLIOZ 20 MG Caps Generic drug:  Tasimelteon Take 20 mg by mouth at bedtime.   INGREZZA 80 MG Caps Generic drug:  Valbenazine Tosylate Take 80 mg by mouth at bedtime.   insulin aspart 100 UNIT/ML FlexPen Commonly known as:  NOVOLOG FLEXPEN Inject 6 units with each meal daily and use sliding scale for further dosage   oxyCODONE 5 MG immediate release tablet Commonly known as:  Oxy IR/ROXICODONE Take 1 tablet (5 mg total) by mouth every 6 (six) hours as needed for severe pain.   pantoprazole 40 MG tablet Commonly known as:  PROTONIX Take 1 tablet (40 mg total) by mouth daily.   QUEtiapine 300 MG tablet Commonly known as:  SEROQUEL Take 600 mg by mouth at bedtime. One hour before bed   ropinirole 5 MG tablet Commonly known as:  REQUIP TAKE 1 TABLET BY MOUTH THREE TIMES A DAY   VRAYLAR 6 MG Caps Generic drug:  Cariprazine HCl Take 6 mg by mouth at bedtime.       Allergies:  Allergies  Allergen Reactions  . Latex Itching, Rash and Other (See Comments)    Pt states she cannot use condoms - cause an infection.  Use of latex  on skin is okay for short period of time.  Tape causes rash  . Sweetening Enhancer [Flavoring Agent] Nausea And Vomiting and Other (See Comments)    HEADACHES  . Aspartame And Phenylalanine Nausea And Vomiting    HEADACHES  . Ibuprofen Other (See Comments)    HEADACHES  . Trazodone And Nefazodone Other (See Comments)    Hallucinations     Past Medical History:  Diagnosis Date  . Anxiety   . Arthritis   . Bipolar disorder ([place])   . Broken jaw ([place])   . Broken wrist   . Chronic back pain   .  Claudication ([place])    a. 12/2013 ABI's: R 0.97, L 0.94.  [name] COPD (chronic obstructive pulmonary disease) ([place])   . Depression   . Diabetic Charcot's foot ([place])   . Diabetic foot ulcer ([facility])    chronic left foot ulcer , great toe/  hx recurrent foot ulcer bilaterally  . DJD (degenerative joint disease)   . Edema of both lower extremities   . Episode of memory loss   . GERD (gastroesophageal reflux disease)   . Headache(784.0)   . Hiatal hernia   . History of colon cancer, stage I dx 02-26-2015--- oncologist-- dr [name]--- per last note no recurrence   05-16-2015 s/p  Laparoscopic sigmoid colectomy w/ node bx's (negative nodes per path)  Stage I (pT1,N0,M0) Grade 2  . 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   per MRI multiple small strokes post cardiac cath. --  residual mild memory loss  . History of methicillin resistant staphylococcus aureus (MRSA)   . Hypercholesterolemia   . Insomnia   . Insulin dependent type 2 diabetes mellitus, uncontrolled ([place]) dx 2004   endocrinologist-  dr Dwyane Dee-- last A1c 8.8 in Aug2018:  pt is noncompliant w/ diet, states does note eat breakfast , her first main meal in afternoon  . Neurogenic bladder   . Neuropathy, diabetic ([place])    hands and feet  . OSA (obstructive sleep apnea)    cpap intolerant  . Personality disorder ([place])   . Restless leg syndrome   . SOB (shortness of breath) on exertion   . Stroke ([place])   . SUI  (stress urinary incontinence, female) S/P SLING 12-29-2011    Past Surgical History:  Procedure Laterality Date  . AMPUTATION Right 04/13/2018   Procedure: RIGHT BELOW KNEE AMPUTATION;  Surgeon: Newt Minion, MD;  Location: [place];  Service: Orthopedics;  Laterality: Right;  . AMPUTATION Left 10/26/2018   Procedure: LEFT FOOT FIRST RAY AMPUTATION;  Surgeon: Newt Minion, MD;  Location: [place];  Service: Orthopedics;  Laterality: Left;  . ANTERIOR CERVICAL DECOMP/DISCECTOMY FUSION  2000   C5 - 7  . APPLICATION OF WOUND VAC Left 10/26/2018   Procedure: APPLICATION OF WOUND VAC;  Surgeon: Newt Minion, MD;  Location: [place];  Service: Orthopedics;  Laterality: Left;  . BACK SURGERY    . CARDIAC CATHETERIZATION  05-22-2008   DR SKAINS   NO SIGNIFECANT CAD/ NORMAL LV/  EF 65%/  NO WALL MOTION ABNORMALITIES  . CARPAL TUNNEL RELEASE Right 04-25-2013  . CATARACT EXTRACTION Right 10/24/2018  . [place]; 1992  . COLON SURGERY    . COLONOSCOPY    . CYSTO N/A 04/30/2013   Procedure: CYSTOSCOPY;  Surgeon: Reece Packer, MD;  Location: [facility];  Service: Urology;  Laterality: N/A;  . CYSTOSCOPY MACROPLASTIQUE IMPLANT N/A 02/06/2018   Procedure: CYSTOSCOPY MACROPLASTIQUE IMPLANT;  Surgeon: Bjorn Loser, MD;  Location: [facility];  Service: Urology;  Laterality: N/A;  . CYSTOSCOPY WITH INJECTION  05/04/2012   Procedure: CYSTOSCOPY WITH INJECTION;  Surgeon: Reece Packer, MD;  Location: [place];  Service: Urology;  Laterality: N/A;  MACROPLASTIQUE INJECTION  . CYSTOSCOPY WITH INJECTION  08/28/2012   Procedure: CYSTOSCOPY WITH INJECTION;  Surgeon: Reece Packer, MD;  Location[facility];  Service: Urology;  Laterality: N/A;  cysto and macroplastique   . FOOT SURGERY Bilateral    "related to [place]"  . HERNIA REPAIR  ?1996   "  stomach"  . I&D EXTREMITY Left 10/25/2018   Procedure: IRRIGATION AND DEBRIDEMENT GREAT TOE;   Surgeon: Meredith Pel, MD;  Location: [place];  Service: Orthopedics;  Laterality: Left;  . INCISION AND DRAINAGE Left 10/25/2018   GREAT TOE  . INCISION AND DRAINAGE OF WOUND Right 04/11/2018   Procedure: IRRIGATION AND DEBRIDEMENT WOUND right foot and right ankle;  Surgeon: Evelina Bucy, DPM;  Location: [facility];  Service: Podiatry;  Laterality: Right;  . KNEE ARTHROSCOPY Left   . KNEE ARTHROSCOPY W/ ALLOGRAFT IMPANT Left    graft x 2  . KNEE SURGERY     TOTAL 8 SURG'S  . LAPAROSCOPIC SIGMOID COLECTOMY N/A 04/22/2015   Procedure: LAPAROSCOPIC HAND ASSISTED SIGMOID COLECTOMY;  Surgeon: Erroll Luna, MD;  Location: [place];  Service: General;  Laterality: N/A;  . LEFT HEART CATHETERIZATION WITH CORONARY ANGIOGRAM N/A 02/12/2014   Procedure: LEFT HEART CATHETERIZATION WITH CORONARY ANGIOGRAM;  Surgeon: Candee Furbish, MD;  Location: [facility];  Service: Cardiovascular;  Laterality: N/A;   No angiographically significant CAD; normal LVSF, LVEDP 5mmHg,  EF 55% (new finding ef 30% myoview 01-08-2014)  . LUMBAR FUSION    . MANDIBLE FRACTURE SURGERY    . MULTIPLE LAPAROSCOPIES FOR ENDOMETRIOSIS    . PUBOVAGINAL SLING  12/29/2011   Procedure: Gaynelle Arabian;  Surgeon: Reece Packer, MD;  Location[facility];  Service: Urology;  Laterality: N/A;  cysto and sparc sling   . PUBOVAGINAL SLING N/A 04/30/2013   Procedure: REMOVAL OF VAGINAL MESH;  Surgeon: Reece Packer, MD;  Location[facility];  Service: Urology;  Laterality: N/A;  . RECONSTURCTION OF CONGENITAL UTERUS ANOMALY  1983  . REPEAT RECONSTRUCTION ACL LEFT KNEE/ SCREWS REMOVED  03-28-2000   CADAVER GRAFT  . TOTAL ABDOMINAL HYSTERECTOMY  1997   w/BSO  . TRANSTHORACIC ECHOCARDIOGRAM  02/13/2014   ef 45%, hypokinesis base inferior and base inferolateral walls  . UPPER GI ENDOSCOPY    . WOUND DEBRIDEMENT Right 09/05/2016   Procedure: DEBRIDEMENT WOUND WITH GRAFT RIGHT FOOT;  Surgeon: Landis Martins, DPM;  Location:  [place];  Service: Podiatry;  Laterality: Right;  . WRIST FRACTURE SURGERY      Family History  Problem Relation Age of Onset  . Hypertension Mother   . Diabetes Mother   . Cancer - Other Mother        lymphoma   . Cancer - Other Father        lung, bladder cancer   . Heart attack Father   . Cancer - Other Brother        bladder cancer     Social History:  reports that she has been smoking cigarettes. She has a 92.00 pack-year smoking history. She has never used smokeless tobacco. She reports previous drug use. She reports that she does not drink alcohol.  Review of Systems -    She has history of high triglycerides previously treated with fenofibrate and is not on any treatment    Lab Results  Component Value Date   CHOL 76 04/12/2018   HDL 15 (L) 04/12/2018   LDLCALC 37 04/12/2018   LDLDIRECT 72.0 03/19/2018   TRIG 122 04/12/2018   CHOLHDL 5.1 04/12/2018     HYPOKALEMIA/hyponatremia: Need follow-up, currently not taking any supplements  Lab Results  Component Value Date   CREATININE 0.68 10/25/2018   BUN 5 (L) 10/25/2018   NA 138 10/25/2018   K 3.3 (L) 10/25/2018   CL 108 10/25/2018  CO2 21 (L) 10/25/2018    Painful neuropathy: She has had persistent symptoms including leg pains and numbness as well as difficulty with balance  Previously has tried various drugs including gabapentin without much relief and has been given oxycodone   Foot exams: Last showed absent pedal pulses and Absent distal sensation  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 Gershon Crane: Done in 1/19     Examination:   BP 122/76 (BP Location: Left Arm, Patient Position: Sitting, Cuff Size: Normal)   Pulse 82   Ht 5\' 5"  (1.651 m)   SpO2 99%   BMI 30.45 kg/m   Body mass index is 30.45 kg/m.      Assesment/Plan:   Diabetes type 2, with persistently poor control, insulin requiring  See history of present illness for  detailed discussion of current diabetes management, blood sugar patterns and problems identified  Her A1c is still persistently high at 9.2  She has not been regular with her follow-up and has not been seen for almost a year Although she is doing slightly better with her drinking sweet drinks her blood sugar control is not improved She has been given less insulin possibly her hospital discharge and reportedly has readings as high as 400 at home She is taking currently much less NovoLog than before despite no change in her appetite   Recommendations:  Sweet tea further  Start checking sugars at least 2-3 times a day, needs to make sure her test is not out of date  She will need to take NovoLog before each meal regardless of whether she has tested or not and start with 10 units for main meals and 6 units for snacks or sweet drinks  Increase Basaglar to 26 units and increase further by 2 units every 3 days until morning sugars are at least under 140  Discussed blood sugar targets after meals also  Trial of Jardiance again, reassured her that she had taken this or Invokana in the past without difficulties and her UTI may or may not be related to the medication since she already has had an infection even without the medication.  Discussed that this will help her overall control and limit any weight gain with increasing insulin  Discussed that she is at risk for further complications including amputations and needs to get better control  History of hypokalemia: Needs follow-up labs   Patient Instructions  Please review the following instructions carefully  Stop drinking juice, may have low sugar fluids, water with lemon  Check sugars before each meal and at bedtime  INSULIN instruction:  Take Basaglar 26 units daily If by Sunday your fasting morning sugars are still over 130 increase the dose to 28 units and continue increasing by 2 units every 3 days  NOVOLOG: Take at least 10  units before each main meal and 4 units for any snack with carbohydrate or sugar  Make sure you bring your blood sugar monitor for each visit  Start taking Invokana in the morning daily and drink plenty of water     Counseling time on subjects discussed in assessment and plan sections is over 50% of today's 25 minute visit    Elayne Snare 01/08/2019, 3:44 PM

## 2019-01-08 NOTE — Telephone Encounter (Signed)
[facility] [phone]    Verbal orders   re-certification of wound care  2 times a week

## 2019-01-08 NOTE — Telephone Encounter (Signed)
Left foot 1st ray right BKA. Called and lm on vm to advise verbal ok for recert

## 2019-01-08 NOTE — Patient Instructions (Signed)
Please review the following instructions carefully  Stop drinking juice, may have low sugar fluids, water with lemon  Check sugars before each meal and at bedtime  INSULIN instruction:  Take Basaglar 26 units daily If by Sunday your fasting morning sugars are still over 130 increase the dose to 28 units and continue increasing by 2 units every 3 days  NOVOLOG: Take at least 10 units before each main meal and 4 units for any snack with carbohydrate or sugar  Make sure you bring your blood sugar monitor for each visit  Start taking Invokana in the morning daily and drink plenty of water

## 2019-01-16 ENCOUNTER — Telehealth (INDEPENDENT_AMBULATORY_CARE_PROVIDER_SITE_OTHER): Payer: Self-pay | Admitting: Orthopedic Surgery

## 2019-01-16 NOTE — Telephone Encounter (Signed)
Pt called saying she needed to keep her apt for tomorrow due to some complications with her wound.  Pt # [phone]

## 2019-01-16 NOTE — Telephone Encounter (Signed)
That ok. Will remain on schedule for tomorrow.

## 2019-01-17 ENCOUNTER — Encounter (INDEPENDENT_AMBULATORY_CARE_PROVIDER_SITE_OTHER): Payer: Self-pay | Admitting: Orthopedic Surgery

## 2019-01-17 ENCOUNTER — Ambulatory Visit (INDEPENDENT_AMBULATORY_CARE_PROVIDER_SITE_OTHER): Payer: Medicare Other | Admitting: Orthopedic Surgery

## 2019-01-17 VITALS — Ht 65.0 in | Wt 183.0 lb

## 2019-01-17 DIAGNOSIS — Z89412 Acquired absence of left great toe: Secondary | ICD-10-CM

## 2019-01-18 ENCOUNTER — Encounter (INDEPENDENT_AMBULATORY_CARE_PROVIDER_SITE_OTHER): Payer: Self-pay | Admitting: Orthopedic Surgery

## 2019-01-18 NOTE — Progress Notes (Signed)
Office Visit Note   Patient: [name]           Date of Birth: [birth year]           MRN: [id number] Visit Date: 01/17/2019              Requested by: Tamsen Roers, [place], [place] [address] PCP: Tamsen Roers, MD  Chief Complaint  Patient presents with  . Left Foot - Follow-up, Pain  . Right Leg - Follow-up      HPI: Patient is a 53 year old woman who presents she is approximately 3 months status post left foot first ray amputation.  She states her pain level is a 7/10 complains of a scab and bruising.  Assessment & Plan: Visit Diagnoses:  1. 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  Patient will resume routine wound care.  She will follow-up with Dr. Cannon Kettle for custom orthotics.  Follow-Up Instructions: Return in about 4 weeks (around 02/14/2019).   Ortho Exam  Patient is alert, oriented, no adenopathy, well-dressed, normal affect, normal respiratory effort. Examination patient has a superficial abrasion over the residual limb there is no ulcers no cellulitis.  Examination the left foot she has a Waggoner grade 1 ulcer that is 2 mm deep.  After informed consent a 10 blade knife was used to debride the skin and soft tissue back to healthy viable granulation tissue silver nitrate was used for hemostasis.  The ulcer is 10 mm in diameter 2 mm deep no exposed bone or tendon.  Imaging: No results found. No images are attached to the encounter.  Labs: Lab Results  Component Value Date   HGBA1C 9.2 (A) 01/08/2019   HGBA1C 10.6 (H) 07/03/2018   HGBA1C 9.2 03/19/2018   ESRSEDRATE 55 (H) 02/19/2016   CRP 17.4 (H) 02/19/2016   LABURIC 5.8 02/19/2016   REPTSTATUS 07/04/2018 FINAL 07/03/2018   GRAMSTAIN  04/11/2018    RARE WBC PRESENT, PREDOMINANTLY PMN NO ORGANISMS SEEN    CULT  07/03/2018    NO GROWTH Performed at [facility], [id number], [place], [place] [address]    LABORGA ESCHERICHIA COLI (A) 05/24/2018     Lab Results  Component Value Date   ALBUMIN 3.5 01/08/2019   ALBUMIN 3.3 (L) 07/03/2018   ALBUMIN 3.3 (L) 06/28/2018   LABURIC 5.8 02/19/2016    Body mass index is 30.45 kg/m.  Orders:  No orders of the defined types were placed in this encounter.  No orders of the defined types were placed in this encounter.    Procedures: No procedures performed  Clinical Data: No additional findings.  ROS:  All other systems negative, except as noted in the HPI. Review of Systems  Objective: Vital Signs: Ht 5\' 5"  (1.651 m)   Wt 183 lb (83 kg)   BMI 30.45 kg/m   Specialty Comments:  No specialty comments available.  PMFS History: Patient Active Problem List   Diagnosis Date Noted  . Postmenopausal bleeding 11/15/2018  . Dog bite of foot, sequela   . Left foot infection 10/25/2018  . Gangrenous disorder ([place]) 09/17/2018  . Cellulitis of left upper extremity   . Poorly controlled diabetes mellitus ([place])   . UTI (urinary tract infection) 05/23/2018  . Acute metabolic encephalopathy 82/95/6213  . GERD (gastroesophageal reflux disease) 05/09/2018  . Tobacco abuse 05/09/2018  . Polypharmacy 05/09/2018  .  Restless leg syndrome 04/27/2018  . Anxiety 04/27/2018  . Hyponatremia 04/26/2018  . Noncompliance with dietary restriction 04/26/2018  . Postoperative cellulitis of surgical wound 04/26/2018  . At high risk for falls 04/24/2018  . Noncompliance with safety precautions 04/24/2018  . Leukocytosis 04/24/2018  . 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 PAD (peripheral artery disease) ([place])   . Poorly controlled type 2 diabetes mellitus ([place])   . Subacute osteomyelitis, right ankle and foot ([place])   . Major depressive disorder, recurrent episode ([place]) 04/11/2018  . Diabetic ulcer of right midfoot associated with type 2 diabetes mellitus, with necrosis of bone ([place])   . Abscess of ankle   . Infectious synovitis   . Diabetic  foot infection ([place])   . Sepsis ([place]) 04/09/2018  . Melanocytic nevus 02/28/2018  . Lesion of labia 09/27/2017  . Vaginitis and vulvovaginitis 02/19/2016  . Cancer of sigmoid colon ([place]) 03/09/2015  . History of stroke within last year 02/12/2015  . OSA (obstructive sleep apnea) 07/04/2014  . Migraine without aura, with intractable migraine, so stated, without mention of status migrainosus 03/26/2014  . Peripheral neuropathy 03/03/2014  . Abnormal stress test 02/12/2014  . CVA (cerebral infarction) 02/12/2014  . Chest pain   . Abnormal heart rhythm   . Edema   . Hypertension   . Hyperlipemia   . Hypercholesterolemia   . Type II diabetes mellitus, uncontrolled ([place]) 07/22/2013  . Diabetic neuropathy with neurologic complication ([place]) 28/00/3491  . Diabetic polyneuropathy ([place]) 07/22/2013   Past Medical History:  Diagnosis Date  . Anxiety   . Arthritis   . Bipolar disorder ([place])   . Broken jaw ([place])   . Broken wrist   . Chronic back pain   . Claudication ([place])    a. 12/2013 ABI's: R 0.97, L 0.94.  [name] COPD (chronic obstructive pulmonary disease) ([place])   . Depression   . Diabetic Charcot's foot ([place])   . Diabetic foot ulcer (HCC)    chronic left foot ulcer , great toe/  hx recurrent foot ulcer bilaterally  . DJD (degenerative joint disease)   . Edema of both lower extremities   . Episode of memory loss   . GERD (gastroesophageal reflux disease)   . Headache(784.0)   . Hiatal hernia   . History of colon cancer, stage I dx 02-26-2015--- oncologist-- dr [name]--- per last note no recurrence   05-16-2015 s/p  Laparoscopic sigmoid colectomy w/ node bx's (negative nodes per path)  Stage I (pT1,N0,M0) Grade 2  . 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   per MRI multiple small strokes post cardiac cath. --  residual mild memory loss  . History of methicillin resistant staphylococcus aureus (MRSA)   . Hypercholesterolemia   . Insomnia   . Insulin  dependent type 2 diabetes mellitus, uncontrolled ([place]) dx 2004   endocrinologist-  dr Dwyane Dee-- last A1c 8.8 in Aug2018:  pt is noncompliant w/ diet, states does note eat breakfast , her first main meal in afternoon  . Neurogenic bladder   . Neuropathy, diabetic ([place])    hands and feet  . OSA (obstructive sleep apnea)    cpap intolerant  . Personality disorder ([place])   . Restless leg syndrome   . SOB (shortness of breath) on exertion   . Stroke ([place])   . SUI (stress urinary incontinence, female) S/P SLING 12-29-2011    Family History  Problem Relation Age of Onset  . Hypertension Mother   .  Diabetes Mother   . Cancer - Other Mother        lymphoma   . Cancer - Other Father        lung, bladder cancer   . Heart attack Father   . Cancer - Other Brother        bladder cancer     Past Surgical History:  Procedure Laterality Date  . AMPUTATION Right 04/13/2018   Procedure: RIGHT BELOW KNEE AMPUTATION;  Surgeon: Newt Minion, MD;  Location: [facility];  Service: Orthopedics;  Laterality: Right;  . AMPUTATION Left 10/26/2018   Procedure: LEFT FOOT FIRST RAY AMPUTATION;  Surgeon: Newt Minion, MD;  Location: [facility];  Service: Orthopedics;  Laterality: Left;  . ANTERIOR CERVICAL DECOMP/DISCECTOMY FUSION  2000   C5 - 7  . APPLICATION OF WOUND VAC Left 10/26/2018   Procedure: APPLICATION OF WOUND VAC;  Surgeon: Newt Minion, MD;  Location: [facility];  Service: Orthopedics;  Laterality: Left;  . BACK SURGERY    . CARDIAC CATHETERIZATION  05-22-2008   DR SKAINS   NO SIGNIFECANT CAD/ NORMAL LV/  EF 65%/  NO WALL MOTION ABNORMALITIES  . CARPAL TUNNEL RELEASE Right 04-25-2013  . CATARACT EXTRACTION Right 10/24/2018  . [place]; 1992  . COLON SURGERY    . COLONOSCOPY    . CYSTO N/A 04/30/2013   Procedure: CYSTOSCOPY;  Surgeon: Reece Packer, MD;  Location: [facility];  Service: Urology;  Laterality: N/A;  . CYSTOSCOPY MACROPLASTIQUE IMPLANT N/A 02/06/2018   Procedure: CYSTOSCOPY  MACROPLASTIQUE IMPLANT;  Surgeon: Bjorn Loser, MD;  Location: [facility];  Service: Urology;  Laterality: N/A;  . CYSTOSCOPY WITH INJECTION  05/04/2012   Procedure: CYSTOSCOPY WITH INJECTION;  Surgeon: Reece Packer, MD;  Location: [facility];  Service: Urology;  Laterality: N/A;  MACROPLASTIQUE INJECTION  . CYSTOSCOPY WITH INJECTION  08/28/2012   Procedure: CYSTOSCOPY WITH INJECTION;  Surgeon: Reece Packer, MD;  Location[facility];  Service: Urology;  Laterality: N/A;  cysto and macroplastique   . FOOT SURGERY Bilateral    "related to [place]"  . HERNIA REPAIR  ?1996   "stomach"  . I&D EXTREMITY Left 10/25/2018   Procedure: IRRIGATION AND DEBRIDEMENT GREAT TOE;  Surgeon: Meredith Pel, MD;  Location: [facility];  Service: Orthopedics;  Laterality: Left;  . INCISION AND DRAINAGE Left 10/25/2018   GREAT TOE  . INCISION AND DRAINAGE OF WOUND Right 04/11/2018   Procedure: IRRIGATION AND DEBRIDEMENT WOUND right foot and right ankle;  Surgeon: Evelina Bucy, DPM;  Location: [facility];  Service: Podiatry;  Laterality: Right;  . KNEE ARTHROSCOPY Left   . KNEE ARTHROSCOPY W/ ALLOGRAFT IMPANT Left    graft x 2  . KNEE SURGERY     TOTAL 8 SURG'S  . LAPAROSCOPIC SIGMOID COLECTOMY N/A 04/22/2015   Procedure: LAPAROSCOPIC HAND ASSISTED SIGMOID COLECTOMY;  Surgeon: Erroll Luna, MD;  Location: [facility];  Service: General;  Laterality: N/A;  . LEFT HEART CATHETERIZATION WITH CORONARY ANGIOGRAM N/A 02/12/2014   Procedure: LEFT HEART CATHETERIZATION WITH CORONARY ANGIOGRAM;  Surgeon: Candee Furbish, MD;  [facility];  Service: Cardiovascular;  Laterality: N/A;   No angiographically significant CAD; normal LVSF, LVEDP 30mmHg,  EF 55% (new finding ef 30% myoview 01-08-2014)  . LUMBAR FUSION    . MANDIBLE FRACTURE SURGERY    . MULTIPLE LAPAROSCOPIES FOR ENDOMETRIOSIS    . PUBOVAGINAL SLING  12/29/2011   Procedure: PUBO-VAGINAL  SLING;  Surgeon:  Reece Packer, MD;  Location: [facility];  Service: Urology;  Laterality: N/A;  cysto and sparc sling   . PUBOVAGINAL SLING N/A 04/30/2013   Procedure: REMOVAL OF VAGINAL MESH;  Surgeon: Reece Packer, MD;  Location[facility];  Service: Urology;  Laterality: N/A;  . RECONSTURCTION OF CONGENITAL UTERUS ANOMALY  1983  . REPEAT RECONSTRUCTION ACL LEFT KNEE/ SCREWS REMOVED  03-28-2000   CADAVER GRAFT  . TOTAL ABDOMINAL HYSTERECTOMY  1997   w/BSO  . TRANSTHORACIC ECHOCARDIOGRAM  02/13/2014   ef 45%, hypokinesis base inferior and base inferolateral walls  . UPPER GI ENDOSCOPY    . WOUND DEBRIDEMENT Right 09/05/2016   Procedure: DEBRIDEMENT WOUND WITH GRAFT RIGHT FOOT;  Surgeon: Landis Martins, DPM;  Location: [facility];  Service: Podiatry;  Laterality: Right;  . WRIST FRACTURE SURGERY     Social History   Occupational History  . Not on file  Tobacco Use  . Smoking status: Current Every Day Smoker    Packs/day: 2.00    Years: 46.00    Pack years: 92.00    Types: Cigarettes  . Smokeless tobacco: Never Used  . Tobacco comment: per pt started smoking  age 10  Substance and Sexual Activity  . Alcohol use: No    Comment: 05/24/2018 "special occasions only"  . Drug use: Not Currently  . Sexual activity: Yes

## 2019-01-21 ENCOUNTER — Other Ambulatory Visit (INDEPENDENT_AMBULATORY_CARE_PROVIDER_SITE_OTHER): Payer: Medicare Other

## 2019-01-21 ENCOUNTER — Telehealth: Payer: Self-pay | Admitting: Endocrinology

## 2019-01-21 ENCOUNTER — Other Ambulatory Visit: Payer: Self-pay | Admitting: Endocrinology

## 2019-01-21 DIAGNOSIS — N3090 Cystitis, unspecified without hematuria: Secondary | ICD-10-CM

## 2019-01-21 NOTE — Telephone Encounter (Signed)
Patient requests a RX for a urinary tract infection be sent to [facility] on [address].

## 2019-01-21 NOTE — Telephone Encounter (Signed)
Do you want her to come to office and get another sample to do C&S?

## 2019-01-21 NOTE — Telephone Encounter (Signed)
She should be treated by her PCP since we do not have a culture on the urine

## 2019-01-21 NOTE — Telephone Encounter (Signed)
Noted  

## 2019-01-21 NOTE — Telephone Encounter (Signed)
Pt just left this office after giving a urine specimen.

## 2019-01-21 NOTE — Telephone Encounter (Signed)
Urinalysis and culture will be sent, will contact patient tomorrow with results

## 2019-01-22 LAB — URINALYSIS, ROUTINE W REFLEX MICROSCOPIC
Bilirubin Urine: NEGATIVE
Hgb urine dipstick: NEGATIVE
Ketones, ur: NEGATIVE
Leukocytes, UA: NEGATIVE
Nitrite: NEGATIVE
RBC / HPF: NONE SEEN (ref 0–?)
Specific Gravity, Urine: <=1.005 — AB (ref 1.000–1.030)
TOTAL PROTEIN, URINE-UPE24: NEGATIVE
Urobilinogen, UA: 0.2 (ref 0.0–1.0)
pH: 6.5 (ref 5.0–8.0)

## 2019-01-23 LAB — URINE CULTURE

## 2019-01-23 NOTE — Progress Notes (Signed)
Please call to let patient know that the culture does not show any infection and no further action needed.  Need to make follow-up appointment

## 2019-01-25 ENCOUNTER — Telehealth: Payer: Self-pay

## 2019-02-03 ENCOUNTER — Other Ambulatory Visit: Payer: Self-pay | Admitting: Sports Medicine

## 2019-02-06 ENCOUNTER — Telehealth: Payer: Self-pay

## 2019-02-06 ENCOUNTER — Other Ambulatory Visit: Payer: Self-pay

## 2019-02-06 DIAGNOSIS — C187 Malignant neoplasm of sigmoid colon: Secondary | ICD-10-CM

## 2019-02-06 NOTE — Telephone Encounter (Signed)
Spoke with patient per Dr. [name] will refer to [facility] for lynch syndrome testing.

## 2019-02-06 NOTE — Progress Notes (Signed)
Sent referral to [facility] for testing for Lynch Syndrome per Dr. [name]

## 2019-02-06 NOTE — Telephone Encounter (Signed)
Patient calls stating she has been trying to get an appointment to get tested for Lynch Syndrome, has called four times. Has family history father/aunt/uncle and a cousin have Lynch syndrome.    [phone]

## 2019-02-07 ENCOUNTER — Telehealth (INDEPENDENT_AMBULATORY_CARE_PROVIDER_SITE_OTHER): Payer: Self-pay | Admitting: Orthopedic Surgery

## 2019-02-07 ENCOUNTER — Telehealth: Payer: Self-pay | Admitting: Hematology

## 2019-02-07 NOTE — Telephone Encounter (Signed)
Olivia Mackie -Nurse with [facility] called advised wound have scabbed over and she need to decrease frequency of nurse visit to once a week. The number to contact Olivia Mackie is [phone]

## 2019-02-07 NOTE — Telephone Encounter (Signed)
Scheduled appt per 2/12 sch message - pt is aware of apt date and time

## 2019-02-08 NOTE — Telephone Encounter (Signed)
Called and sw [name] to advise verbal ok to decrease frequency of nurse visits to once a week.

## 2019-02-12 ENCOUNTER — Ambulatory Visit (INDEPENDENT_AMBULATORY_CARE_PROVIDER_SITE_OTHER): Payer: Medicare Other

## 2019-02-12 ENCOUNTER — Encounter: Payer: Self-pay | Admitting: Sports Medicine

## 2019-02-12 ENCOUNTER — Other Ambulatory Visit: Payer: Self-pay | Admitting: Sports Medicine

## 2019-02-12 ENCOUNTER — Ambulatory Visit (INDEPENDENT_AMBULATORY_CARE_PROVIDER_SITE_OTHER): Payer: Medicare Other | Admitting: Sports Medicine

## 2019-02-12 VITALS — BP 146/83 | HR 83 | Resp 16

## 2019-02-12 DIAGNOSIS — E1142 Type 2 diabetes mellitus with diabetic polyneuropathy: Secondary | ICD-10-CM | POA: Diagnosis not present

## 2019-02-12 DIAGNOSIS — R252 Cramp and spasm: Secondary | ICD-10-CM | POA: Diagnosis not present

## 2019-02-12 DIAGNOSIS — M79672 Pain in left foot: Secondary | ICD-10-CM

## 2019-02-12 DIAGNOSIS — Z899 Acquired absence of limb, unspecified: Secondary | ICD-10-CM

## 2019-02-12 MED ORDER — CYCLOBENZAPRINE HCL 10 MG PO TABS: 10.0000 mg | ORAL_TABLET | Freq: Three times a day (TID) | ORAL | 0 refills | Status: DC | PRN

## 2019-02-12 NOTE — Progress Notes (Signed)
Subjective: [name] is a 53 y.o. female patient with history of diabetes who comes to office for f/u eval on left after BKA on right.  Reports that in October her dog chewed on her toe and it got infected and had to have it removed. Since has had surgery and the areas stitched closed and had home nurse coming to her home wrapping the foot but now it is healed. Reports that she needs a shoe for the left because her current shoe is too big and moves around if her foot is not wrapped since having toe removed. Patient also admits to muscle cramps worse at bedtime. Requip isn't helping. Denies any other issues.  FBS not checked, last A1c 9.2.   Patient Active Problem List   Diagnosis Date Noted  . Postmenopausal bleeding 11/15/2018  . Dog bite of foot, sequela   . Left foot infection 10/25/2018  . Gangrenous disorder ([place]) 09/17/2018  . Cellulitis of left upper extremity   . Poorly controlled diabetes mellitus ([place])   . UTI (urinary tract infection) 05/23/2018  . Acute metabolic encephalopathy 76/16/0737  . GERD (gastroesophageal reflux disease) 05/09/2018  . Tobacco abuse 05/09/2018  . Polypharmacy 05/09/2018  . Restless leg syndrome 04/27/2018  . Anxiety 04/27/2018  . Hyponatremia 04/26/2018  . Noncompliance with dietary restriction 04/26/2018  . Postoperative cellulitis of surgical wound 04/26/2018  . At high risk for falls 04/24/2018  . Noncompliance with safety precautions 04/24/2018  . Leukocytosis 04/24/2018  . 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 PAD (peripheral artery disease) ([place])   . Poorly controlled type 2 diabetes mellitus ([place])   . Subacute osteomyelitis, right ankle and foot ([place])   . Major depressive disorder, recurrent episode ([place]) 04/11/2018  . Diabetic ulcer of right midfoot associated with type 2 diabetes mellitus, with necrosis of bone ([place])   . Abscess of ankle   . Infectious synovitis   . Diabetic foot infection ([place])   . Sepsis ([place])  04/09/2018  . Melanocytic nevus 02/28/2018  . Lesion of labia 09/27/2017  . Vaginitis and vulvovaginitis 02/19/2016  . Cancer of sigmoid colon ([place]) 03/09/2015  . History of stroke within last year 02/12/2015  . OSA (obstructive sleep apnea) 07/04/2014  . Migraine without aura, with intractable migraine, so stated, without mention of status migrainosus 03/26/2014  . Peripheral neuropathy 03/03/2014  . Abnormal stress test 02/12/2014  . CVA (cerebral infarction) 02/12/2014  . Chest pain   . Abnormal heart rhythm   . Edema   . Hypertension   . Hyperlipemia   . Hypercholesterolemia   . Type II diabetes mellitus, uncontrolled ([place]) 07/22/2013  . Diabetic neuropathy with neurologic complication ([place]) 10/62/6948  . Diabetic polyneuropathy ([place]) 07/22/2013   Current Outpatient Medications on File Prior to Visit  Medication Sig Dispense Refill  . aspirin-acetaminophen-caffeine (EXCEDRIN MIGRAINE) 250-250-65 MG tablet Take 2 tablets by mouth every 6 (six) hours as needed for headache.    . clonazePAM (KLONOPIN) 1 MG tablet Take 1 mg by mouth 2 (two) times daily.   1  . diazepam (VALIUM) 5 MG tablet Take 5 mg by mouth 2 (two) times daily.     . empagliflozin (JARDIANCE) 10 MG TABS tablet Take 10 mg by mouth daily with breakfast. 30 tablet 3  . esomeprazole (NEXIUM) 40 MG capsule Take 40 mg by mouth daily.     [name] gabapentin (NEURONTIN) 600 MG tablet Take 1 tablet (600 mg total) by mouth 2 (two) times daily. Take in the morning  and afternoon. Do not take at night time. (Patient taking differently: Take 1,200 mg by mouth at bedtime. )    . glucose blood (FREESTYLE TEST STRIPS) test strip USE TO CHECK BLOOD SUGARS DAILY. DX CODE E11.65 50 each 3  . HETLIOZ 20 MG CAPS Take 20 mg by mouth at bedtime.    . INGREZZA 80 MG CAPS Take 80 mg by mouth at bedtime.    . insulin aspart (NOVOLOG FLEXPEN) 100 UNIT/ML FlexPen Inject 6 units with each meal daily and use sliding scale for further dosage 15 mL 3  .  Insulin Glargine (BASAGLAR KWIKPEN) 100 UNIT/ML SOPN Inject 0.22 mLs (22 Units total) into the skin daily. 15 mL 3  . oxyCODONE (OXY IR/ROXICODONE) 5 MG immediate release tablet Take 1 tablet (5 mg total) by mouth every 6 (six) hours as needed for severe pain. 30 tablet 0  . pantoprazole (PROTONIX) 40 MG tablet Take 1 tablet (40 mg total) by mouth daily. 30 tablet 0  . QUEtiapine (SEROQUEL) 300 MG tablet Take 600 mg by mouth at bedtime. One hour before bed  1  . ropinirole (REQUIP) 5 MG tablet TAKE 1 TABLET BY MOUTH THREE TIMES A DAY 90 tablet 0  . VRAYLAR 6 MG CAPS Take 6 mg by mouth at bedtime.   1   No current facility-administered medications on file prior to visit.    Allergies  Allergen Reactions  . Latex Itching, Rash and Other (See Comments)    Pt states she cannot use condoms - cause an infection.  Use of latex on skin is okay for short period of time.  Tape causes rash  . Sweetening Enhancer [Flavoring Agent] Nausea And Vomiting and Other (See Comments)    HEADACHES  . Ropinirole     Pt stated, "I am getting cramps in my legs - especially at night"  . Aspartame And Phenylalanine Nausea And Vomiting    HEADACHES  . Ibuprofen Other (See Comments)    HEADACHES  . Trazodone And Nefazodone Other (See Comments)    Hallucinations     Objective: General: Patient is awake, alert, and oriented x 3 and in no acute distress.  Integument: Skin is warm, dry and supple. Nails are tender, long, thickened and  dystrophic with subungual debris, consistent with onychomycosis, 2-5 on left. No signs of infection. No open lesions or preulcerative lesions present dry callus sub met 4 on left. Hallux amputation site on left well healed. Remaining integument unremarkable.  Vasculature:  Dorsalis Pedis pulse 1/4 left. Posterior Tibial pulse 1/4 left.  Capillary fill time <5 sec 1-5 on left  Neurology: Absent protective sensation on left  Musculoskeletal:s/p R BKA. Hammertoe deformity on left.   Subjective restless legs and cramping left greater than right amputation stump site and phantom pain.  Assessment and Plan: Problem List Items Addressed This Visit      Endocrine   Diabetic polyneuropathy ([place])   Relevant Medications   cyclobenzaprine (FLEXERIL) 10 MG tablet    Other Visit Diagnoses    Left foot pain    -  Primary   Relevant Orders   DG Foot Complete Left   S/P amputation       Muscle cramp         -Examined patient. -re-Discussed and educated patient on diabetic foot care, especially with  regards to the vascular, neurological and musculoskeletal systems.  -Mechanically debrided all nails 2-5 on left at no charge -Safe step diabetic shoe order form was completed; office  to contact primary care for approval / certification;  Office to arrange shoe fitting and dispensing on left with custom shoe with insole with toe filler  -Change Requip for restless leg to Flexiril 10 mg and advised patient to refrain from mixing with pain medication -Return to office for shoe/insole measurements  -Continue with home nursing wound check and PT if patient qualifies  -Patient advised to call the office if any problems or questions arise in the meantime.  Landis Martins, DPM

## 2019-02-14 ENCOUNTER — Ambulatory Visit (INDEPENDENT_AMBULATORY_CARE_PROVIDER_SITE_OTHER): Payer: Medicare Other | Admitting: Orthopedic Surgery

## 2019-02-20 ENCOUNTER — Telehealth: Payer: Self-pay | Admitting: Neurology

## 2019-02-20 ENCOUNTER — Encounter: Payer: Medicare Other | Admitting: Neurology

## 2019-02-20 ENCOUNTER — Encounter: Payer: Self-pay | Admitting: Neurology

## 2019-02-20 NOTE — Telephone Encounter (Signed)
This patient did not show for an EMG and nerve conduction study appointment today. 

## 2019-02-21 ENCOUNTER — Other Ambulatory Visit: Payer: Self-pay | Admitting: Sports Medicine

## 2019-02-22 ENCOUNTER — Telehealth: Payer: Self-pay | Admitting: *Deleted

## 2019-02-22 NOTE — Telephone Encounter (Signed)
Denial letter for lidocaine patch. Dr. Cannon Kettle does not have orders for Lidocaine patch in pt's clinicals.

## 2019-02-25 ENCOUNTER — Inpatient Hospital Stay: Payer: Medicare Other

## 2019-02-25 ENCOUNTER — Encounter: Payer: Self-pay | Admitting: Genetic Counselor

## 2019-02-25 ENCOUNTER — Inpatient Hospital Stay: Payer: Medicare Other | Attending: Hematology | Admitting: Genetic Counselor

## 2019-02-25 DIAGNOSIS — Z8673 Personal history of transient ischemic attack (TIA), and cerebral infarction without residual deficits: Secondary | ICD-10-CM | POA: Diagnosis not present

## 2019-02-25 DIAGNOSIS — Z8052 Family history of malignant neoplasm of bladder: Secondary | ICD-10-CM

## 2019-02-25 DIAGNOSIS — Z801 Family history of malignant neoplasm of trachea, bronchus and lung: Secondary | ICD-10-CM | POA: Insufficient documentation

## 2019-02-25 DIAGNOSIS — I10 Essential (primary) hypertension: Secondary | ICD-10-CM | POA: Diagnosis not present

## 2019-02-25 DIAGNOSIS — F17210 Nicotine dependence, cigarettes, uncomplicated: Secondary | ICD-10-CM | POA: Diagnosis not present

## 2019-02-25 DIAGNOSIS — J449 Chronic obstructive pulmonary disease, unspecified: Secondary | ICD-10-CM | POA: Insufficient documentation

## 2019-02-25 DIAGNOSIS — Z800 Family history of malignant neoplasm of digestive organs: Secondary | ICD-10-CM

## 2019-02-25 DIAGNOSIS — E1165 Type 2 diabetes mellitus with hyperglycemia: Secondary | ICD-10-CM | POA: Insufficient documentation

## 2019-02-25 DIAGNOSIS — F329 Major depressive disorder, single episode, unspecified: Secondary | ICD-10-CM | POA: Insufficient documentation

## 2019-02-25 DIAGNOSIS — C187 Malignant neoplasm of sigmoid colon: Secondary | ICD-10-CM

## 2019-02-25 DIAGNOSIS — Z8051 Family history of malignant neoplasm of kidney: Secondary | ICD-10-CM | POA: Diagnosis not present

## 2019-02-25 DIAGNOSIS — Z85038 Personal history of other malignant neoplasm of large intestine: Secondary | ICD-10-CM | POA: Insufficient documentation

## 2019-02-25 LAB — COMPREHENSIVE METABOLIC PANEL
ALT: <6 U/L (ref 0–44)
ANION GAP: 14 (ref 5–15)
AST: 7 U/L — ABNORMAL LOW (ref 15–41)
Albumin: 3.6 g/dL (ref 3.5–5.0)
Alkaline Phosphatase: 86 U/L (ref 38–126)
BUN: 7 mg/dL (ref 6–20)
CHLORIDE: 101 mmol/L (ref 98–111)
CO2: 26 mmol/L (ref 22–32)
Calcium: 9.2 mg/dL (ref 8.9–10.3)
Creatinine, Ser: 0.84 mg/dL (ref 0.44–1.00)
GFR calc Af Amer: >60 mL/min (ref >60)
GFR calc non Af Amer: >60 mL/min (ref >60)
Glucose, Bld: 270 mg/dL — ABNORMAL HIGH (ref 70–99)
Potassium: 4 mmol/L (ref 3.5–5.1)
Sodium: 141 mmol/L (ref 135–145)
Total Bilirubin: <0.2 mg/dL — ABNORMAL LOW (ref 0.3–1.2)
Total Protein: 6.9 g/dL (ref 6.5–8.1)

## 2019-02-25 LAB — CBC WITH DIFFERENTIAL/PLATELET
Abs Immature Granulocytes: 0.04 10*3/uL (ref 0.00–0.07)
Basophils Absolute: 0.1 10*3/uL (ref 0.0–0.1)
Basophils Relative: 1 %
Eosinophils Absolute: 0.1 10*3/uL (ref 0.0–0.5)
Eosinophils Relative: 1 %
HCT: 43.6 % (ref 36.0–46.0)
HEMOGLOBIN: 14.1 g/dL (ref 12.0–15.0)
IMMATURE GRANULOCYTES: 0 %
LYMPHS PCT: 22 %
Lymphs Abs: 2.7 10*3/uL (ref 0.7–4.0)
MCH: 30 pg (ref 26.0–34.0)
MCHC: 32.3 g/dL (ref 30.0–36.0)
MCV: 92.8 fL (ref 80.0–100.0)
Monocytes Absolute: 0.4 10*3/uL (ref 0.1–1.0)
Monocytes Relative: 4 %
Neutro Abs: 8.5 10*3/uL — ABNORMAL HIGH (ref 1.7–7.7)
Neutrophils Relative %: 72 %
Platelets: 264 10*3/uL (ref 150–400)
RBC: 4.7 MIL/uL (ref 3.87–5.11)
RDW: 13.2 % (ref 11.5–15.5)
WBC: 11.9 10*3/uL — ABNORMAL HIGH (ref 4.0–10.5)
nRBC: 0 % (ref 0.0–0.2)

## 2019-02-25 LAB — CEA (IN HOUSE-CHCC): CEA (CHCC-In House): 3.97 ng/mL (ref 0.00–5.00)

## 2019-02-25 NOTE — Progress Notes (Signed)
REFERRING PROVIDER: Truitt Merle, MD [address] [place], [place] [address]  PRIMARY PROVIDER:  Tamsen Roers, MD  PRIMARY REASON FOR VISIT:  1. Cancer of sigmoid colon ([place])   2. Family history of colon cancer   3. Family history of bladder cancer   4. Family history of kidney cancer      HISTORY OF PRESENT ILLNESS:   [name], a 53 y.o. female, was seen for a [facility] cancer genetics consultation at the request of Dr. [name] due to a personal and family history of cancer.  [name] presents to clinic today to discuss the possibility of a hereditary predisposition to cancer, genetic testing, and to further clarify her future cancer risks, as well as potential cancer risks for family members.   In 2017, at the age of 18, [name] was diagnosed with cancer of the sigmoid colon. This was treated with surgery. Tumor testing at the time showed normal IHC and MSI was stable. She reports having a hysterectomy with BSO at age 67 due to endometriosis that was close to being cancerous.     CANCER HISTORY:  Oncology History   Colon cancer   Staging form: Colon and Rectum, AJCC 7th Edition     Pathologic stage from 05/16/2015: Stage I (T1, N0, cM0) - Signed by Truitt Merle, MD on 06/08/2015       Cancer of sigmoid colon ([place])   02/26/2015 Initial Diagnosis    Sigmoid Colon cancer    02/26/2015 Procedure    Colonoscopy showed internal hemorrhoids, 2 polyps in the transverse and ileocecal valve. There is a ulcerated polypoid lesion in the sigmoid colon which was biopsied.    03/02/2015 Tumor Marker    CEA= 2.5    03/06/2015 Imaging    CT chest, abdomen and pelvis with contrast showed no evidence of metastatic disease.    04/22/2015 Surgery    Sigmoid colon segmental resection, showed grade 2 adenocarcinoma, T1, 1.3 cm, 10 lymph nodes were negative, no lymphovascular invasion, no perineural invasion.      HORMONAL RISK FACTORS:  Menarche was at age 80-10.  First live birth at age 30.   OCP use for approximately 5 years.  Ovaries intact: no.  Hysterectomy: yes.  Menopausal status: postmenopausal.  HRT use: 0 years. Colonoscopy: yes; abnormal. Mammogram within the last year: no. Number of breast biopsies: 0. Up to date with pelvic exams:  no. Any excessive radiation exposure in the past:  no  Past Medical History:  Diagnosis Date  . Anxiety   . Arthritis   . Bipolar disorder ([place])   . Broken jaw ([place] [facility])   . Broken wrist   . Chronic back pain   . Claudication ([place])    a. 12/2013 ABI's: R 0.97, L 0.94.  [name] Colon cancer ([place])   . COPD (chronic obstructive pulmonary disease) ([place])   . Depression   . Diabetic Charcot's foot ([place])   . Diabetic foot ulcer ([facility])    chronic left foot ulcer , great toe/  hx recurrent foot ulcer bilaterally  . DJD (degenerative joint disease)   . Edema of both lower extremities   . Episode of memory loss   . Family history of bladder cancer   . Family history of colon cancer   . Family history of kidney cancer   . GERD (gastroesophageal reflux disease)   . Headache(784.0)   . Hiatal hernia   . History of colon cancer, stage I dx 02-26-2015--- oncologist-- dr [name]--- per  last note no recurrence   05-16-2015 s/p  Laparoscopic sigmoid colectomy w/ node bx's (negative nodes per path)  Stage I (pT1,N0,M0) Grade 2  . 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   per MRI multiple small strokes post cardiac cath. --  residual mild memory loss  . History of methicillin resistant staphylococcus aureus (MRSA)   . Hypercholesterolemia   . Insomnia   . Insulin dependent type 2 diabetes mellitus, uncontrolled ([facility]) dx 2004   endocrinologist-  dr Dwyane Dee-- last A1c 8.8 in Aug2018:  pt is noncompliant w/ diet, states does note eat breakfast , her first main meal in afternoon  . Neurogenic bladder   . Neuropathy, diabetic ([place])    hands and feet  . OSA (obstructive sleep apnea)    cpap intolerant  . Personality disorder  ([place])   . Restless leg syndrome   . SOB (shortness of breath) on exertion   . Stroke ([place])   . SUI (stress urinary incontinence, female) S/P SLING 12-29-2011    Past Surgical History:  Procedure Laterality Date  . AMPUTATION Right 04/13/2018   Procedure: RIGHT BELOW KNEE AMPUTATION;  Surgeon: Newt Minion, MD;  Location: [facility];  Service: Orthopedics;  Laterality: Right;  . AMPUTATION Left 10/26/2018   Procedure: LEFT FOOT FIRST RAY AMPUTATION;  Surgeon: Newt Minion, MD;  Location: [facility];  Service: Orthopedics;  Laterality: Left;  . ANTERIOR CERVICAL DECOMP/DISCECTOMY FUSION  2000   C5 - 7  . APPLICATION OF WOUND VAC Left 10/26/2018   Procedure: APPLICATION OF WOUND VAC;  Surgeon: Newt Minion, MD;  Location: [facility];  Service: Orthopedics;  Laterality: Left;  . BACK SURGERY    . CARDIAC CATHETERIZATION  05-22-2008   DR SKAINS   NO SIGNIFECANT CAD/ NORMAL LV/  EF 65%/  NO WALL MOTION ABNORMALITIES  . CARPAL TUNNEL RELEASE Right 04-25-2013  . CATARACT EXTRACTION Right 10/24/2018  . [facility]; 1992  . COLON SURGERY    . COLONOSCOPY    . CYSTO N/A 04/30/2013   Procedure: CYSTOSCOPY;  Surgeon: Reece Packer, MD;  Location: [facility];  Service: Urology;  Laterality: N/A;  . CYSTOSCOPY MACROPLASTIQUE IMPLANT N/A 02/06/2018   Procedure: CYSTOSCOPY MACROPLASTIQUE IMPLANT;  Surgeon: Bjorn Loser, MD;  [facility];  Service: Urology;  Laterality: N/A;  . CYSTOSCOPY WITH INJECTION  05/04/2012   Procedure: CYSTOSCOPY WITH INJECTION;  Surgeon: Reece Packer, MD;  Location: [facility];  Service: Urology;  Laterality: N/A;  MACROPLASTIQUE INJECTION  . CYSTOSCOPY WITH INJECTION  08/28/2012   Procedure: CYSTOSCOPY WITH INJECTION;  Surgeon: Reece Packer, MD;  [facility];  Service: Urology;  Laterality: N/A;  cysto and macroplastique   . FOOT SURGERY Bilateral    "related to [place]"  . HERNIA REPAIR  ?1996    "stomach"  . I&D EXTREMITY Left 10/25/2018   Procedure: IRRIGATION AND DEBRIDEMENT GREAT TOE;  Surgeon: Meredith Pel, MD;  Location: [facility];  Service: Orthopedics;  Laterality: Left;  . INCISION AND DRAINAGE Left 10/25/2018   GREAT TOE  . INCISION AND DRAINAGE OF WOUND Right 04/11/2018   Procedure: IRRIGATION AND DEBRIDEMENT WOUND right foot and right ankle;  Surgeon: Evelina Bucy, DPM;  Location[facility];  Service: Podiatry;  Laterality: Right;  . KNEE ARTHROSCOPY Left   . KNEE ARTHROSCOPY W/ ALLOGRAFT IMPANT Left    graft x 2  . KNEE SURGERY  TOTAL 8 SURG'S  . LAPAROSCOPIC SIGMOID COLECTOMY N/A 04/22/2015   Procedure: LAPAROSCOPIC HAND ASSISTED SIGMOID COLECTOMY;  Surgeon: Erroll Luna, MD;  Location: [facility];  Service: General;  Laterality: N/A;  . LEFT HEART CATHETERIZATION WITH CORONARY ANGIOGRAM N/A 02/12/2014   Procedure: LEFT HEART CATHETERIZATION WITH CORONARY ANGIOGRAM;  Surgeon: Candee Furbish, MD;  [facility];  Service: Cardiovascular;  Laterality: N/A;   No angiographically significant CAD; normal LVSF, LVEDP 47mHg,  EF 55% (new finding ef 30% myoview 01-08-2014)  . LUMBAR FUSION    . MANDIBLE FRACTURE SURGERY    . MULTIPLE LAPAROSCOPIES FOR ENDOMETRIOSIS    . PUBOVAGINAL SLING  12/29/2011   Procedure: PGaynelle Arabian  Surgeon: SReece Packer MD;  [facility]  Service: Urology;  Laterality: N/A;  cysto and sparc sling   . PUBOVAGINAL SLING N/A 04/30/2013   Procedure: REMOVAL OF VAGINAL MESH;  Surgeon: SReece Packer MD;  [facility];  Service: Urology;  Laterality: N/A;  . RECONSTURCTION OF CONGENITAL UTERUS ANOMALY  1983  . REPEAT RECONSTRUCTION ACL LEFT KNEE/ SCREWS REMOVED  03-28-2000   CADAVER GRAFT  . TOTAL ABDOMINAL HYSTERECTOMY  1997   w/BSO  . TRANSTHORACIC ECHOCARDIOGRAM  02/13/2014   ef 45%, hypokinesis base inferior and base inferolateral walls  . UPPER GI ENDOSCOPY    . WOUND DEBRIDEMENT Right 09/05/2016    Procedure: DEBRIDEMENT WOUND WITH GRAFT RIGHT FOOT;  Surgeon: TLandis Martins DPM;  [facility]  Service: Podiatry;  Laterality: Right;  . WRIST FRACTURE SURGERY      Social History   Socioeconomic History  . Marital status: Married    Spouse name: Not on file  . Number of children: 2  . Years of education: 6th  . Highest education level: Not on file  Occupational History  . Not on file  Social Needs  . Financial resource strain: Not on file  . Food insecurity:    Worry: Not on file    Inability: Not on file  . Transportation needs:    Medical: Not on file    Non-medical: Not on file  Tobacco Use  . Smoking status: Current Every Day Smoker    Packs/day: 2.00    Years: 46.00    Pack years: 92.00    Types: Cigarettes  . Smokeless tobacco: Never Used  . Tobacco comment: per pt started smoking  age 53 Substance and Sexual Activity  . Alcohol use: No    Comment: 05/24/2018 "special occasions only"  . Drug use: Not Currently  . Sexual activity: Yes  Lifestyle  . Physical activity:    Days per week: Not on file    Minutes per session: Not on file  . Stress: Not on file  Relationships  . Social connections:    Talks on phone: Not on file    Gets together: Not on file    Attends religious service: Not on file    Active member of club or organization: Not on file    Attends meetings of clubs or organizations: Not on file    Relationship status: Not on file  Other Topics Concern  . Not on file  Social History Narrative   Patient is divorced and lives alone- independent in ADLs, Drives   Patient has two adult children- grown son and daughter who is special needs in a home   Patient is disabled since 89  Patient has a 6th grade education.   Patient is right-handed.  Depression-medication and therapist   Patient drinks 2- 3 liters of soda and drinks tea but not everyday.    Does not routinely exercise.      Patient reports that she drinks "a lot" of caffeine  daily      FAMILY HISTORY:  We obtained a detailed, 4-generation family history.  Significant diagnoses are listed below: Family History  Problem Relation Age of Onset  . Hypertension Mother   . Diabetes Mother   . Cancer - Other Mother        lymphoma   . Cancer - Other Father        lung, bladder cancer   . Heart attack Father   . Other Father        Lynch syndrome  . Cancer - Other Brother        bladder cancer   . Bladder Cancer Paternal Aunt        Lynch syndrome  . Other Paternal Uncle        Lynch syndrome  . Other Cousin        Lynch syndrome    The patient has had three children, one died at 23 mo from complications of prematurity.  She has a son and daughter. Her son was diagnosed with melanoma at 29, and her daughter has Angelman Syndrome.  The patient has two brothers.  One had ureter cancer and has two daughters.  The other does not have cancer and tested negative for Lynch syndrome.  Her mother is deceased and her father is living.  The patient's father had kidney, bladder and lung cancer.  He had three brothers and three sisters.  One sister and one brother tested positive for Lynch syndrome.  A few cousins have also tested positive.  The paternal grandparents are deceased, the grandmother had cancer.  The patient's mother had NHL.  She has two sisters and two brothers, who did not have cancer.  The maternal grandparents are deceased.  The grandmother had leukemia.  [name] is aware of previous family history of genetic testing for hereditary cancer risks. Patient's maternal ancestors are of [place], [place] and [place] descent, and paternal ancestors are of [place] and [place] descent. There is no reported Ashkenazi Jewish ancestry. There is no known consanguinity.  GENETIC COUNSELING ASSESSMENT: [name] is a 53 y.o. female with a personal history of colon cancer and family history of colon and bladder cancer which is somewhat suggestive of a hereditary cancer  syndrome and predisposition to cancer. We, therefore, discussed and recommended the following at today's visit.   DISCUSSION: We discussed that about 5-7% of colon cancer is heredity with most cases due to Lynch syndrome.  The patein reports that her father is positive for Lynch syndrome.  We discussed that her tumor testing in 2016 was negative for Lynch syndrome, however this test can miss about 10% of cases.  Based on the family history and that her father also tested positive, it is suggestive that she will also test positive for Lynch syndrome, despite the normal tumor testing.    We reviewed the characteristics, features and inheritance patterns of hereditary cancer syndromes. We also discussed genetic testing, including the appropriate family members to test, the process of testing, insurance coverage and turn-around-time for results. We discussed the implications of a negative, positive and/or variant of uncertain significant result. We recommended [name] pursue genetic testing for the common hereditary cancer gene panel. The Hereditary Gene Panel offered by [facility] includes sequencing  and/or deletion duplication testing of the following 47 genes: APC, ATM, AXIN2, BARD1, BMPR1A, BRCA1, BRCA2, BRIP1, CDH1, CDK4, CDKN2A (p14ARF), CDKN2A (p16INK4a), CHEK2, CTNNA1, DICER1, EPCAM (Deletion/duplication testing only), GREM1 (promoter region deletion/duplication testing only), KIT, MEN1, MLH1, MSH2, MSH3, MSH6, MUTYH, NBN, NF1, NHTL1, PALB2, PDGFRA, PMS2, POLD1, POLE, PTEN, RAD50, RAD51C, RAD51D, SDHB, SDHC, SDHD, SMAD4, SMARCA4. STK11, TP53, TSC1, TSC2, and VHL.  The following genes were evaluated for sequence changes only: SDHA and HOXB13 c.251G>A variant only.   Based on [name]'s personal and family history of cancer, she meets medical criteria for genetic testing. Despite that she meets criteria, she may still have an out of pocket cost. We discussed that if her out of pocket cost for testing is over  $100, the laboratory will call and confirm whether she wants to proceed with testing.  If the out of pocket cost of testing is less than $100 she will be billed by the genetic testing laboratory.   PLAN: After considering the risks, benefits, and limitations, [name]  provided informed consent to pursue genetic testing and the blood sample was sent to [facility] for analysis of the common hereditary cancer panel. Results should be available within approximately 2-3 weeks' time, at which point they will be disclosed by telephone to [name], as will any additional recommendations warranted by these results. [name] will receive a summary of her genetic counseling visit and a copy of her results once available. This information will also be available in Epic. We encouraged [name] to remain in contact with cancer genetics annually so that we can continuously update the family history and inform her of any changes in cancer genetics and testing that may be of benefit for her family. [name] questions were answered to her satisfaction today. Our contact information was provided should additional questions or concerns arise.  Lastly, we encouraged [name] to remain in contact with cancer genetics annually so that we can continuously update the family history and inform her of any changes in cancer genetics and testing that may be of benefit for this family.   Ms.  [name] questions were answered to her satisfaction today. Our contact information was provided should additional questions or concerns arise. Thank you for the referral and allowing [place] to share in the care of your patient.   Keonda Dow P. Florene Glen, [place], [facility] Certified Genetic Counselor [email or web address] phone: [phone]  The patient was seen for a total of 55 minutes in face-to-face genetic counseling.  This patient was discussed with Drs. Magrinat, Lindi Adie and/or [name] who agrees with the above.     _______________________________________________________________________ For Office Staff:  Number of people involved in session: 2 Was an Intern/ student involved with case: no

## 2019-02-27 ENCOUNTER — Telehealth: Payer: Self-pay | Admitting: Hematology

## 2019-02-27 NOTE — Telephone Encounter (Signed)
Scheduled appt per 3/3 sch message - pt is aware of appt date and time   

## 2019-03-04 ENCOUNTER — Ambulatory Visit (INDEPENDENT_AMBULATORY_CARE_PROVIDER_SITE_OTHER): Payer: Medicare Other | Admitting: Orthopedic Surgery

## 2019-03-05 ENCOUNTER — Telehealth: Payer: Self-pay | Admitting: *Deleted

## 2019-03-05 NOTE — Telephone Encounter (Signed)
Left message informing pt of Dr. Leeanne Rio change of orders.

## 2019-03-05 NOTE — Telephone Encounter (Signed)
Received denial from insurance for Lidocaine patch. Dr. Cannon Kettle states pt may use Aspercreme Pain Patch as alternative.

## 2019-03-05 NOTE — Progress Notes (Signed)
[place]  [phone] [phone]  Clinic follow up Note   Patient Care Team: Tamsen Roers, MD as PCP - General (Family Medicine) Elayne Snare, MD as Consulting Physician (Endocrinology) Erroll Luna, MD as Consulting Physician (General Surgery) Truitt Merle, MD as Consulting Physician (Hematology) Arta Silence, MD as Consulting Physician (Gastroenterology) 05/05/2017  CHIEF COMPLAINTS:  Follow up stage I colon cancer   Oncology History   Colon cancer   Staging form: Colon and Rectum, AJCC 7th Edition     Pathologic stage from 05/16/2015: Stage I (T1, N0, cM0) - Signed by Truitt Merle, MD on 06/08/2015       Cancer of sigmoid colon ([place])   02/26/2015 Initial Diagnosis    Sigmoid Colon cancer    02/26/2015 Procedure    Colonoscopy showed internal hemorrhoids, 2 polyps in the transverse and ileocecal valve. There is a ulcerated polypoid lesion in the sigmoid colon which was biopsied.    03/02/2015 Tumor Marker    CEA= 2.5    03/06/2015 Imaging    CT chest, abdomen and pelvis with contrast showed no evidence of metastatic disease.    04/22/2015 Surgery    Sigmoid colon segmental resection, showed grade 2 adenocarcinoma, T1, 1.3 cm, 10 lymph nodes were negative, no lymphovascular invasion, no perineural invasion.     CURRENT THERAPY: Surveillance  INTERIM HISTORY  [name] returns for follow-up.  She was accompanied by a female friend to clinic today.  She came in with a wheelchair, due to her right BKA.  She was last seen by me in September 2018, and has had multiple no-shows since then.  She has been struggling with her diabetes, which is poorly controlled.  She is not compliant with her diet and self-monitoring of her sugar.  She also had left great toe amputation in October 2019.  She complains of fatigue, diffuse body pain, but denies any significant abdominal distention, nausea, or change of bowel habit.  She has been eating well.  Her weight is  stable.  No hematochezia.  Her last colonoscopy with Dr. Paulita Fujita was unremarkable last year per patient.     MEDICAL HISTORY:  Past Medical History:  Diagnosis Date  . Anxiety   . Arthritis   . Bipolar disorder ([place])   . Broken jaw ([place])   . Broken wrist   . Chronic back pain   . Claudication ([place])    a. 12/2013 ABI's: R 0.97, L 0.94.  [name] Colon cancer ([place])   . COPD (chronic obstructive pulmonary disease) ([place])   . Depression   . Diabetic Charcot's foot ([place])   . Diabetic foot ulcer ([facility])    chronic left foot ulcer , great toe/  hx recurrent foot ulcer bilaterally  . DJD (degenerative joint disease)   . Edema of both lower extremities   . Episode of memory loss   . Family history of bladder cancer   . Family history of colon cancer   . Family history of kidney cancer   . GERD (gastroesophageal reflux disease)   . Headache(784.0)   . Hiatal hernia   . History of colon cancer, stage I dx 02-26-2015--- oncologist-- dr [name]--- per last note no recurrence   05-16-2015 s/p  Laparoscopic sigmoid colectomy w/ node bx's (negative nodes per path)  Stage I (pT1,N0,M0) Grade 2  . 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   per MRI multiple small strokes post cardiac cath. --  residual mild memory loss  .  History of methicillin resistant staphylococcus aureus (MRSA)   . Hypercholesterolemia   . Insomnia   . Insulin dependent type 2 diabetes mellitus, uncontrolled ([place]) dx 2004   endocrinologist-  dr Dwyane Dee-- last A1c 8.8 in Aug2018:  pt is noncompliant w/ diet, states does note eat breakfast , her first main meal in afternoon  . Neurogenic bladder   . Neuropathy, diabetic ([place])    hands and feet  . OSA (obstructive sleep apnea)    cpap intolerant  . Personality disorder ([place])   . Restless leg syndrome   . SOB (shortness of breath) on exertion   . Stroke ([place])   . SUI (stress urinary incontinence, female) S/P SLING 12-29-2011    SURGICAL HISTORY: Past  Surgical History:  Procedure Laterality Date  . AMPUTATION Right 04/13/2018   Procedure: RIGHT BELOW KNEE AMPUTATION;  Surgeon: Newt Minion, MD;  Location: [facility];  Service: Orthopedics;  Laterality: Right;  . AMPUTATION Left 10/26/2018   Procedure: LEFT FOOT FIRST RAY AMPUTATION;  Surgeon: Newt Minion, MD;  Location: [facility];  Service: Orthopedics;  Laterality: Left;  . ANTERIOR CERVICAL DECOMP/DISCECTOMY FUSION  2000   C5 - 7  . APPLICATION OF WOUND VAC Left 10/26/2018   Procedure: APPLICATION OF WOUND VAC;  Surgeon: Newt Minion, MD;  Location: [facility];  Service: Orthopedics;  Laterality: Left;  . BACK SURGERY    . CARDIAC CATHETERIZATION  05-22-2008   DR SKAINS   NO SIGNIFECANT CAD/ NORMAL LV/  EF 65%/  NO WALL MOTION ABNORMALITIES  . CARPAL TUNNEL RELEASE Right 04-25-2013  . CATARACT EXTRACTION Right 10/24/2018  . [place]; 1992  . COLON SURGERY    . COLONOSCOPY    . CYSTO N/A 04/30/2013   Procedure: CYSTOSCOPY;  Surgeon: Reece Packer, MD;  Location: [facility];  Service: Urology;  Laterality: N/A;  . CYSTOSCOPY MACROPLASTIQUE IMPLANT N/A 02/06/2018   Procedure: CYSTOSCOPY MACROPLASTIQUE IMPLANT;  Surgeon: Bjorn Loser, MD;  Location: [facility];  Service: Urology;  Laterality: N/A;  . CYSTOSCOPY WITH INJECTION  05/04/2012   Procedure: CYSTOSCOPY WITH INJECTION;  Surgeon: Reece Packer, MD;  Location: [facility];  Service: Urology;  Laterality: N/A;  MACROPLASTIQUE INJECTION  . CYSTOSCOPY WITH INJECTION  08/28/2012   Procedure: CYSTOSCOPY WITH INJECTION;  Surgeon: Reece Packer, MD;  Location[facility];  Service: Urology;  Laterality: N/A;  cysto and macroplastique   . FOOT SURGERY Bilateral    "related to [place]"  . HERNIA REPAIR  ?1996   "stomach"  . I&D EXTREMITY Left 10/25/2018   Procedure: IRRIGATION AND DEBRIDEMENT GREAT TOE;  Surgeon: Meredith Pel, MD;  Location: [facility];  Service: Orthopedics;   Laterality: Left;  . INCISION AND DRAINAGE Left 10/25/2018   GREAT TOE  . INCISION AND DRAINAGE OF WOUND Right 04/11/2018   Procedure: IRRIGATION AND DEBRIDEMENT WOUND right foot and right ankle;  Surgeon: Evelina Bucy, DPM;  Location: [facility];  Service: Podiatry;  Laterality: Right;  . KNEE ARTHROSCOPY Left   . KNEE ARTHROSCOPY W/ ALLOGRAFT IMPANT Left    graft x 2  . KNEE SURGERY     TOTAL 8 SURG'S  . LAPAROSCOPIC SIGMOID COLECTOMY N/A 04/22/2015   Procedure: LAPAROSCOPIC HAND ASSISTED SIGMOID COLECTOMY;  Surgeon: Erroll Luna, MD;  Location: [facility];  Service: General;  Laterality: N/A;  . LEFT HEART CATHETERIZATION WITH CORONARY ANGIOGRAM N/A 02/12/2014   Procedure: LEFT HEART CATHETERIZATION WITH CORONARY ANGIOGRAM;  Surgeon: Candee Furbish, MD;  Location[facility];  Service: Cardiovascular;  Laterality: N/A;   No angiographically significant CAD; normal LVSF, LVEDP 74mHg,  EF 55% (new finding ef 30% myoview 01-08-2014)  . LUMBAR FUSION    . MANDIBLE FRACTURE SURGERY    . MULTIPLE LAPAROSCOPIES FOR ENDOMETRIOSIS    . PUBOVAGINAL SLING  12/29/2011   Procedure: PGaynelle Arabian  Surgeon: SReece Packer MD;  [facility]  Service: Urology;  Laterality: N/A;  cysto and sparc sling   . PUBOVAGINAL SLING N/A 04/30/2013   Procedure: REMOVAL OF VAGINAL MESH;  Surgeon: SReece Packer MD;  [facility];  Service: Urology;  Laterality: N/A;  . RECONSTURCTION OF CONGENITAL UTERUS ANOMALY  1983  . REPEAT RECONSTRUCTION ACL LEFT KNEE/ SCREWS REMOVED  03-28-2000   CADAVER GRAFT  . TOTAL ABDOMINAL HYSTERECTOMY  1997   w/BSO  . TRANSTHORACIC ECHOCARDIOGRAM  02/13/2014   ef 45%, hypokinesis base inferior and base inferolateral walls  . UPPER GI ENDOSCOPY    . WOUND DEBRIDEMENT Right 09/05/2016   Procedure: DEBRIDEMENT WOUND WITH GRAFT RIGHT FOOT;  Surgeon: TLandis Martins DPM;  Location: [facility]  Service: Podiatry;  Laterality: Right;  . WRIST FRACTURE SURGERY        SOCIAL HISTORY: Social History   Socioeconomic History  . Marital status: Married    Spouse name: Not on file  . Number of children: 2  . Years of education: 6th  . Highest education level: Not on file  Occupational History  . Not on file  Social Needs  . Financial resource strain: Not on file  . Food insecurity:    Worry: Not on file    Inability: Not on file  . Transportation needs:    Medical: Not on file    Non-medical: Not on file  Tobacco Use  . Smoking status: Current Every Day Smoker    Packs/day: 2.00    Years: 46.00    Pack years: 92.00    Types: Cigarettes  . Smokeless tobacco: Never Used  . Tobacco comment: per pt started smoking  age 53 Substance and Sexual Activity  . Alcohol use: No    Comment: 05/24/2018 "special occasions only"  . Drug use: Not Currently  . Sexual activity: Yes  Lifestyle  . Physical activity:    Days per week: Not on file    Minutes per session: Not on file  . Stress: Not on file  Relationships  . Social connections:    Talks on phone: Not on file    Gets together: Not on file    Attends religious service: Not on file    Active member of club or organization: Not on file    Attends meetings of clubs or organizations: Not on file    Relationship status: Not on file  . Intimate partner violence:    Fear of current or ex partner: Not on file    Emotionally abused: Not on file    Physically abused: Not on file    Forced sexual activity: Not on file  Other Topics Concern  . Not on file  Social History Narrative   Patient is divorced and lives alone- independent in ADLs, Drives   Patient has two adult children- grown son and daughter who is special needs in a home   Patient is disabled since 89  Patient has a 6th grade education.   Patient is right-handed.   Depression-medication and therapist   Patient  drinks 2- 3 liters of soda and drinks tea but not everyday.    Does not routinely exercise.      Patient reports that  she drinks "a lot" of caffeine daily   [facility]  FAMILY HISTORY: Family History  Problem Relation Age of Onset  . Hypertension Mother   . Diabetes Mother   . Cancer - Other Mother        lymphoma   . Cancer - Other Father        lung, bladder cancer   . Heart attack Father   . Other Father        Lynch syndrome  . Cancer - Other Brother        bladder cancer   . Bladder Cancer Paternal Aunt        Lynch syndrome  . Other Paternal Uncle        Lynch syndrome  . Other Cousin        Lynch syndrome    ALLERGIES:  is allergic to latex; sweetening enhancer [flavoring agent]; ropinirole; aspartame and phenylalanine; ibuprofen; and trazodone and nefazodone.  MEDICATIONS:  Current Outpatient Medications  Medication Sig Dispense Refill  . aspirin-acetaminophen-caffeine (EXCEDRIN MIGRAINE) 250-250-65 MG tablet Take 2 tablets by mouth every 6 (six) hours as needed for headache.    . clonazePAM (KLONOPIN) 1 MG tablet Take 1 mg by mouth 2 (two) times daily.   1  . cyclobenzaprine (FLEXERIL) 10 MG tablet TAKE 1 TABLET BY MOUTH THREE TIMES A DAY AS NEEDED FOR MUSCLE SPASMS 30 tablet 0  . diazepam (VALIUM) 5 MG tablet Take 5 mg by mouth 2 (two) times daily.     . empagliflozin (JARDIANCE) 10 MG TABS tablet Take 10 mg by mouth daily with breakfast. 30 tablet 3  . esomeprazole (NEXIUM) 40 MG capsule Take 40 mg by mouth daily.     [name] gabapentin (NEURONTIN) 600 MG tablet Take 1 tablet (600 mg total) by mouth 2 (two) times daily. Take in the morning and afternoon. Do not take at night time. (Patient taking differently: Take 1,200 mg by mouth at bedtime. )    . glucose blood (FREESTYLE TEST STRIPS) test strip USE TO CHECK BLOOD SUGARS DAILY. DX CODE E11.65 50 each 3  . HETLIOZ 20 MG CAPS Take 20 mg by mouth at bedtime.    . INGREZZA 80 MG CAPS Take 80 mg by mouth at bedtime.    . insulin aspart (NOVOLOG FLEXPEN) 100 UNIT/ML FlexPen Inject 6 units with each meal daily and use sliding scale for further  dosage 15 mL 3  . Insulin Glargine (BASAGLAR KWIKPEN) 100 UNIT/ML SOPN Inject 0.22 mLs (22 Units total) into the skin daily. 15 mL 3  . pantoprazole (PROTONIX) 40 MG tablet Take 1 tablet (40 mg total) by mouth daily. 30 tablet 0  . QUEtiapine (SEROQUEL) 300 MG tablet Take 600 mg by mouth at bedtime. One hour before bed  1  . ropinirole (REQUIP) 5 MG tablet TAKE 1 TABLET BY MOUTH THREE TIMES A DAY 90 tablet 0  . VRAYLAR 6 MG CAPS Take 6 mg by mouth at bedtime.   1   No current facility-administered medications for this visit.     REVIEW OF SYSTEMS:   Constitutional: Denies fevers, chills or abnormal night sweats, (+) fatigue  Eyes: Denies blurriness of vision, double vision or watery eyes Ears, nose, mouth, throat, and face: Denies mucositis or sore throat  Respiratory: Denies cough, dyspnea or wheezes Cardiovascular: Denies palpitation, chest  discomfort or lower extremity swelling Gastrointestinal:  Denies nausea, heartburn or change in bowel habits Skin: Denies abnormal skin rashes  Lymphatics: Denies new lymphadenopathy or easy bruising Neurological: Denies numbness, tingling or new weaknesses, (+) diffuse body pain  Behavioral/Psych: Mood is stable, no new changes  All other systems were reviewed with the patient and are negative.  PHYSICAL EXAMINATION:  ECOG PERFORMANCE STATUS: 1 - Symptomatic but completely ambulatory  Vitals:   03/07/19 1505  BP: 126/68  Pulse: 91  Resp: 18  Temp: 97.9 F (36.6 C)  SpO2: 99%   Filed Weights   03/07/19 1505  Weight: 173 lb (78.5 kg)    GENERAL:alert, no distress and comfortable. Flat affect.  SKIN: skin color, texture, turgor are normal, no rashes or significant lesions except her wound at bilateral feet, they are wrapped and pt prefers me not to open it  EYES: normal, conjunctiva are pink and non-injected, sclera clear OROPHARYNX:no exudate, no erythema and lips, buccal mucosa, and tongue normal  NECK: supple, thyroid normal size,  non-tender, without nodularity LYMPH:  no palpable lymphadenopathy in the cervical, axillary or inguinal LUNGS: clear to auscultation and percussion with normal breathing effort HEART: regular rate & rhythm and no murmurs and no lower extremity edema ABDOMEN:abdomen soft,abdominal wall. Non-tender and normal bowel sounds Musculoskeletal: (+) right BKA, left foot wrapped  PSYCH: alert & oriented x 3 with fluent speech NEURO: no focal motor/sensory deficits  LABORATORY DATA:  I have reviewed the data as listed CBC Latest Ref Rng & Units 02/25/2019 10/25/2018 07/05/2018  WBC 4.0 - 10.5 K/uL 11.9(H) 17.8(H) 12.9(H)  Hemoglobin 12.0 - 15.0 g/dL 14.1 13.4 12.4  Hematocrit 36.0 - 46.0 % 43.6 42.2 38.0  Platelets 150 - 400 K/uL 264 246 238    CMP Latest Ref Rng & Units 02/25/2019 01/08/2019 10/25/2018  Glucose 70 - 99 mg/dL 270(H) 166(H) 381(H)  BUN 6 - 20 mg/dL 7 7 5(L)  Creatinine 0.44 - 1.00 mg/dL 0.84 0.61 0.68  Sodium 135 - 145 mmol/L 141 141 138  Potassium 3.5 - 5.1 mmol/L 4.0 4.2 3.3(L)  Chloride 98 - 111 mmol/L 101 103 108  CO2 22 - 32 mmol/L 26 30 21(L)  Calcium 8.9 - 10.3 mg/dL 9.2 9.2 9.0  Total Protein 6.5 - 8.1 g/dL 6.9 6.2 -  Total Bilirubin 0.3 - 1.2 mg/dL <0.2(L) 0.2 -  Alkaline Phos 38 - 126 U/L 86 77 -  AST 15 - 41 U/L 7(L) 14 -  ALT 0 - 44 U/L <6 10 -   CEA was 3.97 February 25, 2019  PATHOLOGY REPORT Diagnosis 04/22/2015 1. Colon, segmental resection for tumor, Sigmoid - COLONIC ADENOCARCINOMA EXTENDING INTO SUBMUCOSA, 1.3 CM - MARGINS NOT INVOLVED. - NINE BENIGN LYMPH NODES (0/9). 2. Colon, segmental resection, Sigmoid - FOCAL MESENTERIC FAT NECROSIS WITH HEMORRHAGE AND FIBROSIS. - NO EVIDENCE OF MALIGNANCY. - ONE BENIGN LYMPH NODE (0/1). 3. Colon, resection margin (donut), Proximal donut - BENIGN COLON TISSUE. - NO EVIDENCE OF MALIGNANCY. 4. Colon, resection margin (donut), Distal donut - BENIGN COLON TISSUE. - NO EVIDENCE OF MALIGNANCY.  Microscopic Comment 1.  COLON AND RECTUM (INCLUDING TRANS-ANAL RESECTION): Specimen: Sigmoid colon with proximal and distal donut margins. Procedure: Segmental resection with proximal and distal donut margins. Tumor site: Sigmoid Specimen integrity: Previously opened Macroscopic intactness of mesorectum: Incomplete Macroscopic tumor perforation: No Invasive tumor: Maximum size: 1.3 cm Histologic type(s): Colonic adenocarcinoma Histologic grade and differentiation: G2: moderately differentiated/low grade Type of polyp in which invasive carcinoma arose: No  residual polyp Microscopic extension of invasive tumor: Into submucosa Lymph-Vascular invasion: N Peri-neural invasion: No Tumor deposit(s) (discontinuous extramural extension): No Resection margins: Proximal margin: Free of tumor Distal margin: Free of tumor Circumferential (radial) (posterior ascending, posterior descending; lateral and posterior mid-rectum; and entire lower 1/3 rectum): N/A Mesenteric margin (sigmoid and transverse): Free of tumor Distance closest margin (if all above margins negative): N/A Treatment effect (neo-adjuvant therapy): No Additional polyp(s): No Non-neoplastic findings: Focal mesenteric adipose tissue fat necrosis with hemorrhage and fibrosis. Lymph nodes: number examined 10; number positive: 0 Pathologic Staging: pT1, pN0, pMX Ancillary studies: Pending. (JDP:kh 4 -28-16)  RADIOGRAPHIC STUDIES: I have personally reviewed the radiological images as listed and agreed with the findings in the report.  CT chest, abdomen and pelvis w contrast on 05/06/2016 IMPRESSION: 1. Stable exam. No evidence for metastatic disease in the chest, abdomen, or pelvis. 2. Suture line in the mid sigmoid colon, compatible with partial colectomy. 3. Centrilobular emphysema. 4. Steatosis.  ASSESSMENT & PLAN: 53 y.o.  Caucasian female, with multiple medical comorbidities, who was found to have a stage I sigmoid colon cancer.  1. Stage I  sigmoid colon adenocarcinoma, pT1N0M0, stage I, moderately differentiated, MMR normal -She was diagnosed in March 2016, has been under surveillance since then, not compliant with f/u.  - her surveillance scan from May 2017 showed no evidence of recurrence. Per patient, she has had colonoscopy again by Dr. Paulita Fujita in 2019 which was negative. -Lab reviewed, CBC and CMP normal, except hyperglycemia and elevated WBC. CEA normal.  She does have multiple complaints, but nothing suspicious for colon cancer recurrence, exam was unremarkable. -It has been 4 years since her initial diagnosis, the risk of recurrence is minimal now. -I will see her one more time in a year to complete a 5-year surveillance  2. Depression and coping -stable   3. Poorly controlled diabetes -not compliant with diet and self-monitoring -I strongly encourage pt to follow up with Dr. Dwyane Dee.   4.  and hypertension, history of stroke, COPD -f/u with PCP   5. Smoking cessation -We previously had discussion about smoking cessation. She is still smoking 2-3 pack a day. -she has not been able to quit    Plan -lab and f/u in one year (last visit)    All questions were answered. The patient knows to call the clinic with any problems, questions or concerns.     Truitt Merle, MD 03/07/2019

## 2019-03-06 ENCOUNTER — Ambulatory Visit (INDEPENDENT_AMBULATORY_CARE_PROVIDER_SITE_OTHER): Payer: Medicare Other | Admitting: Orthopedic Surgery

## 2019-03-07 ENCOUNTER — Inpatient Hospital Stay (HOSPITAL_BASED_OUTPATIENT_CLINIC_OR_DEPARTMENT_OTHER): Payer: Medicare Other | Admitting: Hematology

## 2019-03-07 ENCOUNTER — Other Ambulatory Visit: Payer: Self-pay

## 2019-03-07 ENCOUNTER — Encounter: Payer: Self-pay | Admitting: Hematology

## 2019-03-07 VITALS — BP 126/68 | HR 91 | Temp 97.9°F | Resp 18 | Ht 65.0 in | Wt 173.0 lb

## 2019-03-07 DIAGNOSIS — Z85038 Personal history of other malignant neoplasm of large intestine: Secondary | ICD-10-CM | POA: Diagnosis not present

## 2019-03-07 DIAGNOSIS — Z8051 Family history of malignant neoplasm of kidney: Secondary | ICD-10-CM

## 2019-03-07 DIAGNOSIS — E1165 Type 2 diabetes mellitus with hyperglycemia: Secondary | ICD-10-CM | POA: Diagnosis not present

## 2019-03-07 DIAGNOSIS — F17210 Nicotine dependence, cigarettes, uncomplicated: Secondary | ICD-10-CM

## 2019-03-07 DIAGNOSIS — F329 Major depressive disorder, single episode, unspecified: Secondary | ICD-10-CM

## 2019-03-07 DIAGNOSIS — Z8673 Personal history of transient ischemic attack (TIA), and cerebral infarction without residual deficits: Secondary | ICD-10-CM

## 2019-03-07 DIAGNOSIS — Z801 Family history of malignant neoplasm of trachea, bronchus and lung: Secondary | ICD-10-CM

## 2019-03-07 DIAGNOSIS — C187 Malignant neoplasm of sigmoid colon: Secondary | ICD-10-CM

## 2019-03-07 DIAGNOSIS — J449 Chronic obstructive pulmonary disease, unspecified: Secondary | ICD-10-CM

## 2019-03-07 DIAGNOSIS — Z8052 Family history of malignant neoplasm of bladder: Secondary | ICD-10-CM

## 2019-03-07 DIAGNOSIS — I10 Essential (primary) hypertension: Secondary | ICD-10-CM

## 2019-03-08 ENCOUNTER — Telehealth: Payer: Self-pay | Admitting: Hematology

## 2019-03-08 NOTE — Telephone Encounter (Signed)
Scheduled appt per 3/12 los.  Printed and mailed calendar.

## 2019-03-12 ENCOUNTER — Telehealth: Payer: Self-pay | Admitting: Genetic Counselor

## 2019-03-12 ENCOUNTER — Encounter: Payer: Self-pay | Admitting: Genetic Counselor

## 2019-03-12 DIAGNOSIS — Z1379 Encounter for other screening for genetic and chromosomal anomalies: Secondary | ICD-10-CM | POA: Insufficient documentation

## 2019-03-12 NOTE — Telephone Encounter (Signed)
LM on VM that results are back and to please call. 

## 2019-03-12 NOTE — Telephone Encounter (Signed)
Revealed negative genetic testing.  Discussed that we do not know why she has colon cancer or why there is cancer in the family. It is not due to the MSH6 familial mutation that has been found in her family.  It could be due to a different gene that we are not testing, or maybe our current technology may not be able to pick something up.  It will be important for her to keep in contact with genetics to keep up with whether additional testing may be needed. We reflexed to a larger gene panel and will get back to her on the results of that testing.

## 2019-03-13 ENCOUNTER — Ambulatory Visit (INDEPENDENT_AMBULATORY_CARE_PROVIDER_SITE_OTHER): Payer: Medicare Other | Admitting: Family

## 2019-03-13 ENCOUNTER — Encounter (INDEPENDENT_AMBULATORY_CARE_PROVIDER_SITE_OTHER): Payer: Self-pay | Admitting: Family

## 2019-03-13 ENCOUNTER — Other Ambulatory Visit: Payer: Self-pay

## 2019-03-13 VITALS — Ht 65.0 in | Wt 173.0 lb

## 2019-03-13 DIAGNOSIS — Z89412 Acquired absence of left great toe: Secondary | ICD-10-CM | POA: Diagnosis not present

## 2019-03-13 DIAGNOSIS — Z89511 Acquired absence of right leg below knee: Secondary | ICD-10-CM

## 2019-03-18 ENCOUNTER — Encounter (INDEPENDENT_AMBULATORY_CARE_PROVIDER_SITE_OTHER): Payer: Self-pay | Admitting: Family

## 2019-03-18 NOTE — Progress Notes (Signed)
Office Visit Note   Patient: [name]           Date of Birth: 1966-11-27           MRN: [id number] Visit Date: 03/13/2019              Requested by: Tamsen Roers, [place], [place] [address] PCP: Tamsen Roers, MD  Chief Complaint  Patient presents with  . Left Foot - Follow-up    1st ray amputation   . Right Leg - Follow-up    04/13/18 right BKA       HPI: Patient is a 52 year old woman who presents 4 months status post left first ray amputation and 1 year status post right transtibial amputation.  Patient states she does have a aide at home to help her and she states the home health nursing has been discontinued since she has no further active wound care management.  Patient states that she has soreness and cramping and phantom pain in the right below the knee amputation.  She is being provided Percocet by her primary care physician most recently had 90 tablets.  She states she will be following up with her podiatrist to obtain orthotics.  Patient states she wants a note to state that she needs to take her Percocet 3 times a day as opposed to twice a day.  Assessment & Plan: Visit Diagnoses:  1. 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 Acquired absence of right leg below knee (HCC)     Plan: Recommended against increasing the Percocet dosage.  Recommend for the cramping she could try coconut water.  For the phantom pain recommend she continue with 600 mg of Neurontin 3 times a day.  She is given a prescription for a light weight wheelchair to assist with her ambulation.  And she was given a prescription for biotech for a new socket for her transtibial amputation.  Follow-Up Instructions: Return in about 4 weeks (around 04/10/2019).   Ortho Exam  Patient is not alert, oriented, no adenopathy, well-dressed, normal affect, normal respiratory effort.  She is very groggy and lethargic.  Has a hard time keeping her eyes open. Examination  patient has a loosely fitting prosthesis she has no open ulcers no cellulitis no signs of infection in the right transtibial amputation.  Examination the left foot she has hyper granulation tissue after informed consent a 10 blade knife was used to debride the skin and soft tissue back to healthy viable bleeding tissue there is no exposed bone or tendon silver nitrate was used for hemostasis a dry dressing was applied.  The wound is 3 mm deep 3 cm in length and about 1 cm in width after debridement.  Patient has full range of motion of her right lower extremity.  Imaging: No results found. No images are attached to the encounter.  Labs: Lab Results  Component Value Date   HGBA1C 9.2 (A) 01/08/2019   HGBA1C 10.6 (H) 07/03/2018   HGBA1C 9.2 03/19/2018   ESRSEDRATE 55 (H) 02/19/2016   CRP 17.4 (H) 02/19/2016   LABURIC 5.8 02/19/2016   REPTSTATUS 07/04/2018 FINAL 07/03/2018   GRAMSTAIN  04/11/2018    RARE WBC PRESENT, PREDOMINANTLY PMN NO ORGANISMS SEEN    CULT  07/03/2018    NO GROWTH Performed at [facility], [place] [address], [place], [place] [address]    [place] (A) 05/24/2018     Lab  Results  Component Value Date   ALBUMIN 3.6 02/25/2019   ALBUMIN 3.5 01/08/2019   ALBUMIN 3.3 (L) 07/03/2018   LABURIC 5.8 02/19/2016    Body mass index is 28.79 kg/m.  Orders:  No orders of the defined types were placed in this encounter.  No orders of the defined types were placed in this encounter.    Procedures: No procedures performed  Clinical Data: No additional findings.  ROS:  All other systems negative, except as noted in the HPI. Review of Systems  Objective: Vital Signs: Ht 5\' 5"  (1.651 m)   Wt 173 lb (78.5 kg)   BMI 28.79 kg/m   Specialty Comments:  No specialty comments available.  PMFS History: Patient Active Problem List   Diagnosis Date Noted  . Genetic testing 03/12/2019  . Family history of colon cancer   . Family history of  bladder cancer   . Family history of kidney cancer   . Postmenopausal bleeding 11/15/2018  . Dog bite of foot, sequela   . Left foot infection 10/25/2018  . Gangrenous disorder ([place]) 09/17/2018  . Cellulitis of left upper extremity   . Poorly controlled diabetes mellitus ([place])   . UTI (urinary tract infection) 05/23/2018  . Acute metabolic encephalopathy 94/76/5465  . GERD (gastroesophageal reflux disease) 05/09/2018  . Tobacco abuse 05/09/2018  . Polypharmacy 05/09/2018  . Restless leg syndrome 04/27/2018  . Anxiety 04/27/2018  . Hyponatremia 04/26/2018  . Noncompliance with dietary restriction 04/26/2018  . Postoperative cellulitis of surgical wound 04/26/2018  . At high risk for falls 04/24/2018  . Noncompliance with safety precautions 04/24/2018  . Leukocytosis 04/24/2018  . 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 PAD (peripheral artery disease) ([place])   . Poorly controlled type 2 diabetes mellitus ([place])   . Subacute osteomyelitis, right ankle and foot ([place])   . Major depressive disorder, recurrent episode ([place]) 04/11/2018  . Diabetic ulcer of right midfoot associated with type 2 diabetes mellitus, with necrosis of bone ([place])   . Abscess of ankle   . Infectious synovitis   . Diabetic foot infection ([place])   . Sepsis ([place]) 04/09/2018  . Melanocytic nevus 02/28/2018  . Lesion of labia 09/27/2017  . Vaginitis and vulvovaginitis 02/19/2016  . Cancer of sigmoid colon ([place]) 03/09/2015  . History of stroke within last year 02/12/2015  . OSA (obstructive sleep apnea) 07/04/2014  . Migraine without aura, with intractable migraine, so stated, without mention of status migrainosus 03/26/2014  . Peripheral neuropathy 03/03/2014  . Abnormal stress test 02/12/2014  . CVA (cerebral infarction) 02/12/2014  . Chest pain   . Abnormal heart rhythm   . Edema   . Hypertension   . Hyperlipemia   . Hypercholesterolemia   . Type II diabetes mellitus, uncontrolled ([place]) 07/22/2013  .  Diabetic neuropathy with neurologic complication ([place]) 03/54/6568  . Diabetic polyneuropathy ([place]) 07/22/2013   Past Medical History:  Diagnosis Date  . Anxiety   . Arthritis   . Bipolar disorder ([place])   . Broken jaw ([place])   . Broken wrist   . Chronic back pain   . Claudication ([place])    a. 12/2013 ABI's: R 0.97, L 0.94.  [name] Colon cancer ([place])   . COPD (chronic obstructive pulmonary disease) ([place])   . Depression   . Diabetic Charcot's foot ([place])   . Diabetic foot ulcer (HCC)    chronic left foot ulcer , great toe/  hx recurrent foot ulcer bilaterally  . DJD (degenerative joint disease)   .  Edema of both lower extremities   . Episode of memory loss   . Family history of bladder cancer   . Family history of colon cancer   . Family history of kidney cancer   . GERD (gastroesophageal reflux disease)   . Headache(784.0)   . Hiatal hernia   . History of colon cancer, stage I dx 02-26-2015--- oncologist-- dr [name]--- per last note no recurrence   05-16-2015 s/p  Laparoscopic sigmoid colectomy w/ node bx's (negative nodes per path)  Stage I (pT1,N0,M0) Grade 2  . 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   per MRI multiple small strokes post cardiac cath. --  residual mild memory loss  . History of methicillin resistant staphylococcus aureus (MRSA)   . Hypercholesterolemia   . Insomnia   . Insulin dependent type 2 diabetes mellitus, uncontrolled ([place]) dx 2004   endocrinologist-  dr Dwyane Dee-- last A1c 8.8 in Aug2018:  pt is noncompliant w/ diet, states does note eat breakfast , her first main meal in afternoon  . Neurogenic bladder   . Neuropathy, diabetic ([place])    hands and feet  . OSA (obstructive sleep apnea)    cpap intolerant  . Personality disorder ([place])   . Restless leg syndrome   . SOB (shortness of breath) on exertion   . Stroke ([place])   . SUI (stress urinary incontinence, female) S/P SLING 12-29-2011    Family History  Problem Relation Age of  Onset  . Hypertension Mother   . Diabetes Mother   . Cancer - Other Mother        lymphoma   . Cancer - Other Father        lung, bladder cancer   . Heart attack Father   . Other Father        Lynch syndrome  . Cancer - Other Brother        bladder cancer   . Bladder Cancer Paternal Aunt        Lynch syndrome  . Other Paternal Uncle        Lynch syndrome  . Other Cousin        Lynch syndrome    Past Surgical History:  Procedure Laterality Date  . AMPUTATION Right 04/13/2018   Procedure: RIGHT BELOW KNEE AMPUTATION;  Surgeon: Newt Minion, MD;  Location: [facility];  Service: Orthopedics;  Laterality: Right;  . AMPUTATION Left 10/26/2018   Procedure: LEFT FOOT FIRST RAY AMPUTATION;  Surgeon: Newt Minion, MD;  Location: [facility];  Service: Orthopedics;  Laterality: Left;  . ANTERIOR CERVICAL DECOMP/DISCECTOMY FUSION  2000   C5 - 7  . APPLICATION OF WOUND VAC Left 10/26/2018   Procedure: APPLICATION OF WOUND VAC;  Surgeon: Newt Minion, MD;  Location: [facility];  Service: Orthopedics;  Laterality: Left;  . BACK SURGERY    . CARDIAC CATHETERIZATION  05-22-2008   DR SKAINS   NO SIGNIFECANT CAD/ NORMAL LV/  EF 65%/  NO WALL MOTION ABNORMALITIES  . CARPAL TUNNEL RELEASE Right 04-25-2013  . CATARACT EXTRACTION Right 10/24/2018  . [facility]; 1992  . COLON SURGERY    . COLONOSCOPY    . CYSTO N/A 04/30/2013   Procedure: CYSTOSCOPY;  Surgeon: Reece Packer, MD;  Location: [facility];  Service: Urology;  Laterality: N/A;  . CYSTOSCOPY MACROPLASTIQUE IMPLANT N/A 02/06/2018   Procedure: CYSTOSCOPY MACROPLASTIQUE IMPLANT;  Surgeon: Bjorn Loser, MD;  Location[facility];  Service: Urology;  Laterality: N/A;  . CYSTOSCOPY WITH INJECTION  05/04/2012   Procedure: CYSTOSCOPY WITH INJECTION;  Surgeon: Reece Packer, MD;  Location: [facility];  Service: Urology;  Laterality: N/A;  MACROPLASTIQUE INJECTION  . CYSTOSCOPY WITH INJECTION  08/28/2012    Procedure: CYSTOSCOPY WITH INJECTION;  Surgeon: Reece Packer, MD;  Location: [facility];  Service: Urology;  Laterality: N/A;  cysto and macroplastique   . FOOT SURGERY Bilateral    "related to [place]"  . HERNIA REPAIR  ?1996   "stomach"  . I&D EXTREMITY Left 10/25/2018   Procedure: IRRIGATION AND DEBRIDEMENT GREAT TOE;  Surgeon: Meredith Pel, MD;  Location: [facility];  Service: Orthopedics;  Laterality: Left;  . INCISION AND DRAINAGE Left 10/25/2018   GREAT TOE  . INCISION AND DRAINAGE OF WOUND Right 04/11/2018   Procedure: IRRIGATION AND DEBRIDEMENT WOUND right foot and right ankle;  Surgeon: Evelina Bucy, DPM;  Location: [facility];  Service: Podiatry;  Laterality: Right;  . KNEE ARTHROSCOPY Left   . KNEE ARTHROSCOPY W/ ALLOGRAFT IMPANT Left    graft x 2  . KNEE SURGERY     TOTAL 8 SURG'S  . LAPAROSCOPIC SIGMOID COLECTOMY N/A 04/22/2015   Procedure: LAPAROSCOPIC HAND ASSISTED SIGMOID COLECTOMY;  Surgeon: Erroll Luna, MD;  Location: [facility];  Service: General;  Laterality: N/A;  . LEFT HEART CATHETERIZATION WITH CORONARY ANGIOGRAM N/A 02/12/2014   Procedure: LEFT HEART CATHETERIZATION WITH CORONARY ANGIOGRAM;  Surgeon: Candee Furbish, MD;  Location: [facility];  Service: Cardiovascular;  Laterality: N/A;   No angiographically significant CAD; normal LVSF, LVEDP 45mmHg,  EF 55% (new finding ef 30% myoview 01-08-2014)  . LUMBAR FUSION    . MANDIBLE FRACTURE SURGERY    . MULTIPLE LAPAROSCOPIES FOR ENDOMETRIOSIS    . PUBOVAGINAL SLING  12/29/2011   Procedure: Gaynelle Arabian;  Surgeon: Reece Packer, MD;  Location: [facility];  Service: Urology;  Laterality: N/A;  cysto and sparc sling   . PUBOVAGINAL SLING N/A 04/30/2013   Procedure: REMOVAL OF VAGINAL MESH;  Surgeon: Reece Packer, MD;  Location[facility];  Service: Urology;  Laterality: N/A;  . RECONSTURCTION OF CONGENITAL UTERUS ANOMALY  1983  . REPEAT RECONSTRUCTION ACL LEFT KNEE/ SCREWS  REMOVED  03-28-2000   CADAVER GRAFT  . TOTAL ABDOMINAL HYSTERECTOMY  1997   w/BSO  . TRANSTHORACIC ECHOCARDIOGRAM  02/13/2014   ef 45%, hypokinesis base inferior and base inferolateral walls  . UPPER GI ENDOSCOPY    . WOUND DEBRIDEMENT Right 09/05/2016   Procedure: DEBRIDEMENT WOUND WITH GRAFT RIGHT FOOT;  Surgeon: Landis Martins, DPM;  Location: [facility];  Service: Podiatry;  Laterality: Right;  . WRIST FRACTURE SURGERY     Social History   Occupational History  . Not on file  Tobacco Use  . Smoking status: Current Every Day Smoker    Packs/day: 2.00    Years: 46.00    Pack years: 92.00    Types: Cigarettes  . Smokeless tobacco: Never Used  . Tobacco comment: per pt started smoking  age 49  Substance and Sexual Activity  . Alcohol use: No    Comment: 05/24/2018 "special occasions only"  . Drug use: Not Currently  . Sexual activity: Yes

## 2019-03-25 ENCOUNTER — Ambulatory Visit: Payer: Medicare Other | Admitting: Orthotics

## 2019-04-06 ENCOUNTER — Other Ambulatory Visit: Payer: Self-pay | Admitting: Internal Medicine

## 2019-04-26 ENCOUNTER — Ambulatory Visit (INDEPENDENT_AMBULATORY_CARE_PROVIDER_SITE_OTHER): Payer: Medicare Other

## 2019-04-26 ENCOUNTER — Encounter: Payer: Self-pay | Admitting: Sports Medicine

## 2019-04-26 ENCOUNTER — Ambulatory Visit (INDEPENDENT_AMBULATORY_CARE_PROVIDER_SITE_OTHER): Payer: Medicare Other | Admitting: Sports Medicine

## 2019-04-26 ENCOUNTER — Other Ambulatory Visit: Payer: Self-pay

## 2019-04-26 ENCOUNTER — Telehealth: Payer: Self-pay | Admitting: *Deleted

## 2019-04-26 VITALS — BP 123/74 | HR 85 | Temp 97.7°F | Resp 16

## 2019-04-26 DIAGNOSIS — Z89511 Acquired absence of right leg below knee: Secondary | ICD-10-CM | POA: Diagnosis not present

## 2019-04-26 DIAGNOSIS — L97522 Non-pressure chronic ulcer of other part of left foot with fat layer exposed: Secondary | ICD-10-CM | POA: Diagnosis not present

## 2019-04-26 DIAGNOSIS — E1142 Type 2 diabetes mellitus with diabetic polyneuropathy: Secondary | ICD-10-CM | POA: Diagnosis not present

## 2019-04-26 DIAGNOSIS — L03119 Cellulitis of unspecified part of limb: Secondary | ICD-10-CM | POA: Diagnosis not present

## 2019-04-26 DIAGNOSIS — L02619 Cutaneous abscess of unspecified foot: Secondary | ICD-10-CM

## 2019-04-26 MED ORDER — SULFAMETHOXAZOLE-TRIMETHOPRIM 400-80 MG PO TABS: 1.0000 | ORAL_TABLET | Freq: Two times a day (BID) | ORAL | 0 refills | Status: DC

## 2019-04-26 NOTE — Telephone Encounter (Signed)
-----   Message from [place], [place] sent at 04/26/2019 11:25 AM EDT ----- Regarding: [facility] care to start as soon as possible; betadine and dry dressing to left 2nd toe 3x per week. Watch areas of redness if worsens send patient to ER  Thanks Dr. Cannon Kettle

## 2019-04-26 NOTE — Telephone Encounter (Signed)
Faxed orders to [facility] [place] [place].

## 2019-04-26 NOTE — Progress Notes (Signed)
Subjective: [name] is a 53 y.o. female patient with history of diabetes who comes to office for for evaluation of left second toe patient states that the toe is red swollen with drainage for the last 2 weeks with 6 out of 10 sharp pain states that it started as a small place over the toe states that she was crawling around on the floor and rub the toe due to her inability to use her prosthetic due to pain on the right and scraped the toe which started the infection patient denies any nausea vomiting fever chills and has tried to put a dry dressing on it to catch the drainage however patient has significant social needs and does not have consistent help at home more consistent relatives who can bring her to appointments.  Patient reports that today a neighbor brought her to today's appointment.  Patient denies any other issues.  FBS not checked.  Patient Active Problem List   Diagnosis Date Noted  . Genetic testing 03/12/2019  . Family history of colon cancer   . Family history of bladder cancer   . Family history of kidney cancer   . Postmenopausal bleeding 11/15/2018  . Dog bite of foot, sequela   . Left foot infection 10/25/2018  . Gangrenous disorder ([place]) 09/17/2018  . Cellulitis of left upper extremity   . Poorly controlled diabetes mellitus ([place])   . UTI (urinary tract infection) 05/23/2018  . Acute metabolic encephalopathy 63/87/5643  . GERD (gastroesophageal reflux disease) 05/09/2018  . Tobacco abuse 05/09/2018  . Polypharmacy 05/09/2018  . Restless leg syndrome 04/27/2018  . Anxiety 04/27/2018  . Hyponatremia 04/26/2018  . Noncompliance with dietary restriction 04/26/2018  . Postoperative cellulitis of surgical wound 04/26/2018  . At high risk for falls 04/24/2018  . Noncompliance with safety precautions 04/24/2018  . Leukocytosis 04/24/2018  . Unilateral complete BKA ([place]) 04/16/2018  . PAD (peripheral artery disease) ([place])   . Poorly controlled type 2 diabetes  mellitus ([place])   . Subacute osteomyelitis, right ankle and foot ([place])   . Major depressive disorder, recurrent episode ([place]) 04/11/2018  . Diabetic ulcer of right midfoot associated with type 2 diabetes mellitus, with necrosis of bone ([place])   . Abscess of ankle   . Infectious synovitis   . Diabetic foot infection ([place])   . Sepsis ([place]) 04/09/2018  . Melanocytic nevus 02/28/2018  . Lesion of labia 09/27/2017  . Vaginitis and vulvovaginitis 02/19/2016  . Cancer of sigmoid colon ([place]) 03/09/2015  . History of stroke within last year 02/12/2015  . OSA (obstructive sleep apnea) 07/04/2014  . Migraine without aura, with intractable migraine, so stated, without mention of status migrainosus 03/26/2014  . Peripheral neuropathy 03/03/2014  . Abnormal stress test 02/12/2014  . CVA (cerebral infarction) 02/12/2014  . Chest pain   . Abnormal heart rhythm   . Edema   . Hypertension   . Hyperlipemia   . Hypercholesterolemia   . Type II diabetes mellitus, uncontrolled ([place]) 07/22/2013  . Diabetic neuropathy with neurologic complication ([place]) 32/95/1884  . Diabetic polyneuropathy ([place]) 07/22/2013   Current Outpatient Medications on File Prior to Visit  Medication Sig Dispense Refill  . amLODipine-benazepril (LOTREL) 5-10 MG capsule Take by mouth daily.    . Armodafinil 250 MG tablet Take 250 mg by mouth every morning.    [name] aspirin-acetaminophen-caffeine (EXCEDRIN MIGRAINE) 250-250-65 MG tablet Take 2 tablets by mouth every 6 (six) hours as needed for headache.    . clonazePAM (KLONOPIN) 1 MG  tablet Take 1 mg by mouth 2 (two) times daily.   1  . cyclobenzaprine (FLEXERIL) 10 MG tablet TAKE 1 TABLET BY MOUTH THREE TIMES A DAY AS NEEDED FOR MUSCLE SPASMS 30 tablet 0  . diazepam (VALIUM) 5 MG tablet Take 5 mg by mouth 2 (two) times daily.     . empagliflozin (JARDIANCE) 10 MG TABS tablet Take 10 mg by mouth daily with breakfast. 30 tablet 3  . esomeprazole (NEXIUM) 40 MG capsule Take 40 mg by mouth  daily.     [name] gabapentin (NEURONTIN) 600 MG tablet Take 1 tablet (600 mg total) by mouth 2 (two) times daily. Take in the morning and afternoon. Do not take at night time. (Patient taking differently: Take 1,200 mg by mouth at bedtime. )    . glucose blood (FREESTYLE TEST STRIPS) test strip USE TO CHECK BLOOD SUGARS DAILY. DX CODE E11.65 50 each 3  . HETLIOZ 20 MG CAPS Take 20 mg by mouth at bedtime.    . INGREZZA 80 MG CAPS Take 80 mg by mouth at bedtime.    . insulin aspart (NOVOLOG FLEXPEN) 100 UNIT/ML FlexPen Inject 6 units with each meal daily and use sliding scale for further dosage 15 mL 3  . Insulin Glargine (BASAGLAR KWIKPEN) 100 UNIT/ML SOPN Inject 0.22 mLs (22 Units total) into the skin daily. 15 mL 3  . metFORMIN (GLUCOPHAGE-XR) 750 MG 24 hr tablet Take 750 mg by mouth 2 (two) times daily.    [name] oxyCODONE-acetaminophen (PERCOCET) 10-325 MG tablet 1 TABLET BY MOUTH 4 TIMES DAILY    . pantoprazole (PROTONIX) 40 MG tablet Take 1 tablet (40 mg total) by mouth daily. 30 tablet 0  . QUEtiapine (SEROQUEL) 300 MG tablet Take 600 mg by mouth at bedtime. One hour before bed  1  . ropinirole (REQUIP) 5 MG tablet TAKE 1 TABLET BY MOUTH THREE TIMES A DAY 90 tablet 0  . VRAYLAR 6 MG CAPS Take 6 mg by mouth at bedtime.   1   No current facility-administered medications on file prior to visit.    Allergies  Allergen Reactions  . Latex Itching, Rash and Other (See Comments)    Pt states she cannot use condoms - cause an infection.  Use of latex on skin is okay for short period of time.  Tape causes rash  . Sweetening Enhancer [Flavoring Agent] Nausea And Vomiting and Other (See Comments)    HEADACHES  . Ropinirole     Pt stated, "I am getting cramps in my legs - especially at night"  . Aspartame And Phenylalanine Nausea And Vomiting    HEADACHES  . Ibuprofen Other (See Comments)    HEADACHES  . Trazodone And Nefazodone Other (See Comments)    Hallucinations     Objective: General: Patient  is awake, alert, and oriented x 3 and in no acute distress.  Integument: Skin is warm, dry and supple. Nails are tender, long, thickened and  dystrophic with subungual debris, consistent with onychomycosis, 2-5 on left.  There is a full-thickness ulceration at the left second toe dorsal interphalangeal joint with tendon exposure that measures less than 0.5 cm however there is significant warmth redness swelling and macerated skin around the whole entire circumference of the toe.  There is erythema to the level of the metatarsal phalangeal joint however no warmth, clear to yellow drainage and mild malodor.  Previous hallux amputation surgical site is well-healed.  Remaining integument unremarkable.  Vasculature:  Dorsalis Pedis pulse 1/4  left. Posterior Tibial pulse 1/4 left.  Capillary fill time <5 sec 1-5 on left  Neurology: Absent protective sensation on left  Musculoskeletal:s/p R BKA. Hammertoe deformity on left.  Status post left hallux amputation.  Assessment and Plan: Problem List Items Addressed This Visit      Nervous and Auditory   Diabetic polyneuropathy (HCC)   Relevant Medications   amLODipine-benazepril (LOTREL) 5-10 MG capsule   Armodafinil 250 MG tablet   metFORMIN (GLUCOPHAGE-XR) 750 MG 24 hr tablet   Other Relevant Orders   DG Foot Complete Left   WOUND CULTURE    Other Visit Diagnoses    Toe ulcer, left, with fat layer exposed ([place])    -  Primary   Relevant Orders   DG Foot Complete Left   WOUND CULTURE   Status post below knee amputation, right (HCC)       Relevant Orders   DG Foot Complete Left   Cellulitis and abscess of foot       Relevant Orders   DG Foot Complete Left   WOUND CULTURE     -Examined patient. -X-rays reviewed there is no obvious bony destruction supportive of osteomyelitis, status post first ray amputation -Left second toe ulceration cleanse and loose skin was debrided using a tissue nipper hemostasis was achieved with manual pressure  patient tolerated the debridement procedure well at the left second toe.  It is of note that the wound over the dorsal interphalangeal joint was to the level of exposed tendon and measured as above.  Wound culture was obtained. -The area was dressed with Betadine soaked gauze 4 x 4 Kerlix and Coban; [facility] ordered for patient as well as PT OT; Emailed orders to [facility] at [facility]  -Patient's left foot was marked and advised patient that if area of redness and cellulitis worsens beyond this marking then patient should go to the emergency room for IV antibiotics and likely will need amputation -Rx Bactrim and will wait for culture results to see if there needs to be a change with antibiotic treatment -Patient advised to call the office if any problems or questions arise in the meantime. -Patient to return next week for follow-up evaluation  Landis Martins, DPM

## 2019-04-29 LAB — WOUND CULTURE

## 2019-05-03 ENCOUNTER — Other Ambulatory Visit: Payer: Self-pay

## 2019-05-03 ENCOUNTER — Ambulatory Visit (INDEPENDENT_AMBULATORY_CARE_PROVIDER_SITE_OTHER): Payer: Medicare Other | Admitting: Sports Medicine

## 2019-05-03 ENCOUNTER — Encounter: Payer: Self-pay | Admitting: Sports Medicine

## 2019-05-03 ENCOUNTER — Telehealth: Payer: Self-pay | Admitting: *Deleted

## 2019-05-03 VITALS — Temp 96.9°F | Resp 16

## 2019-05-03 DIAGNOSIS — L97522 Non-pressure chronic ulcer of other part of left foot with fat layer exposed: Secondary | ICD-10-CM | POA: Diagnosis not present

## 2019-05-03 DIAGNOSIS — Z89511 Acquired absence of right leg below knee: Secondary | ICD-10-CM

## 2019-05-03 DIAGNOSIS — L02619 Cutaneous abscess of unspecified foot: Secondary | ICD-10-CM

## 2019-05-03 DIAGNOSIS — E1142 Type 2 diabetes mellitus with diabetic polyneuropathy: Secondary | ICD-10-CM | POA: Diagnosis not present

## 2019-05-03 DIAGNOSIS — L03119 Cellulitis of unspecified part of limb: Secondary | ICD-10-CM

## 2019-05-03 DIAGNOSIS — M79672 Pain in left foot: Secondary | ICD-10-CM

## 2019-05-03 NOTE — Patient Instructions (Signed)
Pre-Operative Instructions  Congratulations, you have decided to take an important step towards improving your quality of life.  You can be assured that the doctors and staff at [facility] will be with you every step of the way.  Here are some important things you should know:  1. Plan to be at the surgery center/hospital at least 1 (one) hour prior to your scheduled time, unless otherwise directed by the surgical center/hospital staff.  You must have a responsible adult accompany you, remain during the surgery and drive you home.  Make sure you have directions to the surgical center/hospital to ensure you arrive on time. 2. If you are having surgery at [facility] or [facility], you will need a copy of your medical history and physical form from your family physician within one month prior to the date of surgery. We will give you a form for your primary physician to complete.  3. We make every effort to accommodate the date you request for surgery.  However, there are times where surgery dates or times have to be moved.  We will contact you as soon as possible if a change in schedule is required.   4. No aspirin/ibuprofen for one week before surgery.  If you are on aspirin, any non-steroidal anti-inflammatory medications (Mobic, Aleve, Ibuprofen) should not be taken seven (7) days prior to your surgery.  You make take Tylenol for pain prior to surgery.  5. Medications - If you are taking daily heart and blood pressure medications, seizure, reflux, allergy, asthma, anxiety, pain or diabetes medications, make sure you notify the surgery center/hospital before the day of surgery so they can tell you which medications you should take or avoid the day of surgery. 6. No food or drink after midnight the night before surgery unless directed otherwise by surgical center/hospital staff. 7. No alcoholic beverages 24-hours prior to surgery.  No smoking 24-hours prior or 24-hours after  surgery. 8. Wear loose pants or shorts. They should be loose enough to fit over bandages, boots, and casts. 9. Don't wear slip-on shoes. Sneakers are preferred. 10. Bring your boot with you to the surgery center/hospital.  Also bring crutches or a walker if your physician has prescribed it for you.  If you do not have this equipment, it will be provided for you after surgery. 11. If you have not been contacted by the surgery center/hospital by the day before your surgery, call to confirm the date and time of your surgery. 12. Leave-time from work may vary depending on the type of surgery you have.  Appropriate arrangements should be made prior to surgery with your employer. 13. Prescriptions will be provided immediately following surgery by your doctor.  Fill these as soon as possible after surgery and take the medication as directed. Pain medications will not be refilled on weekends and must be approved by the doctor. 14. Remove nail polish on the operative foot and avoid getting pedicures prior to surgery. 15. Wash the night before surgery.  The night before surgery wash the foot and leg well with water and the antibacterial soap provided. Be sure to pay special attention to beneath the toenails and in between the toes.  Wash for at least three (3) minutes. Rinse thoroughly with water and dry well with a towel.  Perform this wash unless told not to do so by your physician.  Enclosed: 1 Ice pack (please put in freezer the night before surgery)   1 Hibiclens skin cleaner     Pre-op instructions  If you have any questions regarding the instructions, please do not hesitate to call our office.  [place]: [address], [place], [place] [address] -- [phone]  [place]: [address] [place], [place] [address] -- [phone]  [place]: [address]  [place], [place] [address] -- [phone]  [place]: [address], [address], [place], [place] [address] -- [phone]  Website:  [email or web address] 

## 2019-05-03 NOTE — Progress Notes (Signed)
Subjective: [name] is a 53 y.o. female patient with history of diabetes who comes to office for for evaluation of left second toe patient states that the toe is still red swollen with drainage and hasnt seen much improvement since starting antibiotics. Patient reports that the Bactrim is affecting her hemrhoids and reports that her husband passed away on 2023-04-12 and she is trying to deal with it. Patient reports that today a neighbor brought her to today's appointment.  Patient denies nausea, vomiting, fever, chills or any other issues.  FBS not checked.  Patient Active Problem List   Diagnosis Date Noted  . Genetic testing 03/12/2019  . Family history of colon cancer   . Family history of bladder cancer   . Family history of kidney cancer   . Postmenopausal bleeding 11/15/2018  . Dog bite of foot, sequela   . Left foot infection 10/25/2018  . Gangrenous disorder ([place]) 09/17/2018  . Cellulitis of left upper extremity   . Poorly controlled diabetes mellitus ([place])   . UTI (urinary tract infection) 05/23/2018  . Acute metabolic encephalopathy 41/32/4401  . GERD (gastroesophageal reflux disease) 05/09/2018  . Tobacco abuse 05/09/2018  . Polypharmacy 05/09/2018  . Restless leg syndrome 04/27/2018  . Anxiety 04/27/2018  . Hyponatremia 04/26/2018  . Noncompliance with dietary restriction 04/26/2018  . Postoperative cellulitis of surgical wound 04/26/2018  . At high risk for falls 04/24/2018  . Noncompliance with safety precautions 04/24/2018  . Leukocytosis 04/24/2018  . 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 PAD (peripheral artery disease) ([place])   . Poorly controlled type 2 diabetes mellitus ([place])   . Subacute osteomyelitis, right ankle and foot ([place])   . Major depressive disorder, recurrent episode ([place]) 04/11/2018  . Diabetic ulcer of right midfoot associated with type 2 diabetes mellitus, with necrosis of bone ([place])   . Abscess of ankle   . Infectious synovitis   .  Diabetic foot infection ([place])   . Sepsis ([place]) 04/09/2018  . Melanocytic nevus 02/28/2018  . Lesion of labia 09/27/2017  . Vaginitis and vulvovaginitis 02/19/2016  . Cancer of sigmoid colon ([place]) 03/09/2015  . History of stroke within last year 02/12/2015  . OSA (obstructive sleep apnea) 07/04/2014  . Migraine without aura, with intractable migraine, so stated, without mention of status migrainosus 03/26/2014  . Peripheral neuropathy 03/03/2014  . Abnormal stress test 02/12/2014  . CVA (cerebral infarction) 02/12/2014  . Chest pain   . Abnormal heart rhythm   . Edema   . Hypertension   . Hyperlipemia   . Hypercholesterolemia   . Type II diabetes mellitus, uncontrolled ([place]) 07/22/2013  . Diabetic neuropathy with neurologic complication ([place]) 02/72/5366  . Diabetic polyneuropathy ([place]) 07/22/2013   Current Outpatient Medications on File Prior to Visit  Medication Sig Dispense Refill  . amLODipine-benazepril (LOTREL) 5-10 MG capsule Take by mouth daily.    . Armodafinil 250 MG tablet Take 250 mg by mouth every morning.    [name] aspirin-acetaminophen-caffeine (EXCEDRIN MIGRAINE) 250-250-65 MG tablet Take 2 tablets by mouth every 6 (six) hours as needed for headache.    . clonazePAM (KLONOPIN) 1 MG tablet Take 1 mg by mouth 2 (two) times daily.   1  . cyclobenzaprine (FLEXERIL) 10 MG tablet TAKE 1 TABLET BY MOUTH THREE TIMES A DAY AS NEEDED FOR MUSCLE SPASMS 30 tablet 0  . diazepam (VALIUM) 5 MG tablet Take 5 mg by mouth 2 (two) times daily.     . empagliflozin (JARDIANCE) 10 MG TABS  tablet Take 10 mg by mouth daily with breakfast. 30 tablet 3  . esomeprazole (NEXIUM) 40 MG capsule Take 40 mg by mouth daily.     [name] gabapentin (NEURONTIN) 600 MG tablet Take 1 tablet (600 mg total) by mouth 2 (two) times daily. Take in the morning and afternoon. Do not take at night time. (Patient taking differently: Take 1,200 mg by mouth at bedtime. )    . glucose blood (FREESTYLE TEST STRIPS) test strip USE  TO CHECK BLOOD SUGARS DAILY. DX CODE E11.65 50 each 3  . HETLIOZ 20 MG CAPS Take 20 mg by mouth at bedtime.    . INGREZZA 80 MG CAPS Take 80 mg by mouth at bedtime.    . insulin aspart (NOVOLOG FLEXPEN) 100 UNIT/ML FlexPen Inject 6 units with each meal daily and use sliding scale for further dosage 15 mL 3  . Insulin Glargine (BASAGLAR KWIKPEN) 100 UNIT/ML SOPN Inject 0.22 mLs (22 Units total) into the skin daily. 15 mL 3  . metFORMIN (GLUCOPHAGE-XR) 750 MG 24 hr tablet Take 750 mg by mouth 2 (two) times daily.    [name] oxyCODONE-acetaminophen (PERCOCET) 10-325 MG tablet 1 TABLET BY MOUTH 4 TIMES DAILY    . pantoprazole (PROTONIX) 40 MG tablet Take 1 tablet (40 mg total) by mouth daily. 30 tablet 0  . QUEtiapine (SEROQUEL) 300 MG tablet Take 600 mg by mouth at bedtime. One hour before bed  1  . ropinirole (REQUIP) 5 MG tablet TAKE 1 TABLET BY MOUTH THREE TIMES A DAY 90 tablet 0  . sulfamethoxazole-trimethoprim (BACTRIM) 400-80 MG tablet Take 1 tablet by mouth 2 (two) times daily. 28 tablet 0  . VRAYLAR 6 MG CAPS Take 6 mg by mouth at bedtime.   1   No current facility-administered medications on file prior to visit.    Allergies  Allergen Reactions  . Latex Itching, Rash and Other (See Comments)    Pt states she cannot use condoms - cause an infection.  Use of latex on skin is okay for short period of time.  Tape causes rash  . Sweetening Enhancer [Flavoring Agent] Nausea And Vomiting and Other (See Comments)    HEADACHES  . Ropinirole     Pt stated, "I am getting cramps in my legs - especially at night"  . Aspartame And Phenylalanine Nausea And Vomiting    HEADACHES  . Ibuprofen Other (See Comments)    HEADACHES  . Trazodone And Nefazodone Other (See Comments)    Hallucinations     Objective: General: Patient is awake, alert, and oriented x 3 and in no acute distress.  Integument: Skin is warm, dry and supple. Nails are tender, short, thickened and  dystrophic with subungual debris,  consistent with onychomycosis, 2-5 on left.  There is a full-thickness ulceration at the left second toe dorsal interphalangeal joint with tendon exposure that measures larger 0.7 cm with warmth redness swelling and decreased macerated skin around the whole entire circumference of the toe.  There is erythema to the level of the metatarsal phalangeal joint however with clear to yellow drainage and decreased malodor.  Previous hallux amputation surgical site is well-healed.  Remaining integument unremarkable.  Vasculature:  Dorsalis Pedis pulse 1/4 left. Posterior Tibial pulse 1/4 left.  Capillary fill time <5 sec 1-5 on left  Neurology: Absent protective sensation on left  Musculoskeletal:s/p R BKA. Hammertoe deformity on left.  Status post left hallux amputation.  Assessment and Plan: Problem List Items Addressed This Visit  Nervous and Auditory   Diabetic polyneuropathy ([place])    Other Visit Diagnoses    Toe ulcer, left, with fat layer exposed ([place])    -  Primary   Cellulitis and abscess of foot       Status post below knee amputation, right (H[facility])       Left foot pain         -Examined patient. -Discussed care for infected toe with likely acute osteomyelitis  --Patient opt for surgical management. Consent obtained for left 2nd toe ampuation. Pre and Post op course explained. Risks, benefits, alternatives explained. No guarantees given or implied. Surgical booking slip submitted and provided patient with Surgical packet and info for [facility]. H&P form faxed to PCP for STAT H&P to try to get this amputation done as soon as possible in the coming week to avoid patient hospitalization  -Dispensed surgical shoe to use post op -Left second toe ulceration was cleansed and loose skin was debrided using a tissue nipper hemostasis was achieved with manual pressure patient tolerated the debridement procedure well at the left second toe.  It is of note that the wound over the dorsal  interphalangeal joint was to the level of exposed tendon and measured as above like before.   -Continue with Bactrim  -The area was dressed with Betadine soaked gauze 4 x 4 Kerlix and Coban; [facility] orders for twice weekly dressing changes were emailed to Tiffany at [facility] again since patient reports that her dressing was only changed once on Saturday -Patient advised to call the office if any problems or questions arise in the meantime. -Patient to return after surgery for follow-up evaluation  Landis Martins, DPM

## 2019-05-03 NOTE — Telephone Encounter (Signed)
Laurita Quint, OT - [facility] states pt is requesting to reschedule to a later date due to her husband's death.

## 2019-05-03 NOTE — Telephone Encounter (Signed)
Faxed order for pt to be able to reschedule OT at her convenience, to [facility].

## 2019-05-05 ENCOUNTER — Other Ambulatory Visit: Payer: Self-pay | Admitting: Endocrinology

## 2019-05-07 ENCOUNTER — Telehealth: Payer: Self-pay | Admitting: *Deleted

## 2019-05-07 NOTE — Telephone Encounter (Signed)
Patient left a voicemail message on the casting department's voicemail.  She stated she is going to have her history and physical done today by her primary care physician.  I scheduled her for surgery on 05/10/2019.    I am calling to let you know that we have you scheduled for surgery on May 10, 2019 at 2:30 pm at [facility]. "I thought it was going to be outpatient at [facility] across the street from [facility]."  Your surgery could not be done at [facility] because of your medical problems that you have such as the Sleep Apnea.  "So, I need to be there at 2:30 pm?"  You will probably need to be there about two hours prior to that time.  Your surgery will start at 2:30 pm.  You will need Covid testing.  The pre-op nurse will instruct you regarding how to schedule that test.

## 2019-05-08 ENCOUNTER — Telehealth: Payer: Self-pay | Admitting: *Deleted

## 2019-05-08 ENCOUNTER — Other Ambulatory Visit: Payer: Self-pay

## 2019-05-08 ENCOUNTER — Other Ambulatory Visit (HOSPITAL_COMMUNITY)
Admission: RE | Admit: 2019-05-08 | Discharge: 2019-05-08 | Disposition: A | Discharge status: Home / Self Care | Payer: Medicare Other | Source: Ambulatory Visit | Attending: Sports Medicine | Admitting: Sports Medicine

## 2019-05-08 DIAGNOSIS — Z1159 Encounter for screening for other viral diseases: Secondary | ICD-10-CM | POA: Diagnosis present

## 2019-05-08 LAB — SARS CORONAVIRUS 2 BY RT PCR (HOSPITAL ORDER, PERFORMED IN ~~LOC~~ HOSPITAL LAB): SARS Coronavirus 2: NEGATIVE

## 2019-05-08 NOTE — Telephone Encounter (Signed)
[facility] - Will Shala, PT states OT evaluation has been completed and he would like to see pt 2x week for 2 weeks and then 1x week for the 3rd week.

## 2019-05-08 NOTE — Telephone Encounter (Signed)
Left message informing Will Virgilio Frees, OT Dr. Cannon Kettle agreed with the recommend OT schedule.

## 2019-05-09 ENCOUNTER — Other Ambulatory Visit: Payer: Self-pay

## 2019-05-09 ENCOUNTER — Encounter (HOSPITAL_COMMUNITY): Payer: Self-pay | Admitting: *Deleted

## 2019-05-09 NOTE — Anesthesia Preprocedure Evaluation (Addendum)
Anesthesia Evaluation  Patient identified by MRN, date of birth, ID band Patient awake    Reviewed: Allergy & Precautions, NPO status , Patient's Chart, lab work & pertinent test results  Airway Mallampati: II  TM Distance: >3 FB Neck ROM: Full    Dental  (+) Missing,    Pulmonary sleep apnea and Continuous Positive Airway Pressure Ventilation , COPD,  COPD inhaler, Current Smoker,  Intolerant to CPAP   Pulmonary exam normal breath sounds clear to auscultation       Cardiovascular hypertension, Pt. on medications + Peripheral Vascular Disease   Rhythm:Regular Rate:Normal     Neuro/Psych  Headaches, PSYCHIATRIC DISORDERS Anxiety Depression Bipolar Disorder Diabetic peripheral neuropathy Restless legs syndrome  Neuromuscular disease CVA    GI/Hepatic Neg liver ROS, hiatal hernia, GERD  Medicated and Controlled,  Endo/Other  diabetes, Poorly Controlled, Type 2, Insulin DependentHyperlipidemia  Renal/GU   negative genitourinary   Musculoskeletal  (+) Arthritis , Osteoarthritis,  Osteomyelitis left 2nd toe   Abdominal   Peds  Hematology negative hematology ROS (+)   Anesthesia Other Findings   Reproductive/Obstetrics                            Anesthesia Physical Anesthesia Plan  ASA: III  Anesthesia Plan: MAC   Post-op Pain Management:    Induction: Intravenous  PONV Risk Score and Plan: 2 and Ondansetron, Propofol infusion, Treatment may vary due to age or medical condition and Midazolam  Airway Management Planned: Natural Airway, Simple Face Mask and Nasal Cannula  Additional Equipment:   Intra-op Plan:   Post-operative Plan:   Informed Consent: I have reviewed the patients History and Physical, chart, labs and discussed the procedure including the risks, benefits and alternatives for the proposed anesthesia with the patient or authorized representative who has indicated his/her  understanding and acceptance.     Dental advisory given  Plan Discussed with: CRNA and Surgeon  Anesthesia Plan Comments: (See PAT note by Karoline Caldwell, PA-C )       Anesthesia Quick Evaluation

## 2019-05-09 NOTE — Progress Notes (Signed)
Pt denies SOB, chest pain, and being under the care of a cardiologist. Pt denies having recent labs. Pt made aware to stop taking vitamins, fish oil and herbal medications. Do not take any NSAIDs ie: Ibuprofen, Advil, Naproxen (Aleve), Motrin, BC and Goody Powder. Pt stated that she has no glucometer or medication  " it was all stolen."  Pt stated that last dose of Ingrezza was " Tuesday evening." Nurse advised pt to call prescriber to make aware since taking diabetes medications. Pt stated that she has quarantined as instructed since COVID-19 test on 05/08/2019.  Coronavirus Screening  Pt denies that she and family members have experienced the following symptoms:  Cough yes/no: No Fever (>100.27F)  yes/no: No Runny nose yes/no: No Sore throat yes/no: No Difficulty breathing/shortness of breath  yes/no: No  Have you or a family member traveled in the last 14 days and where? yes/no: No   Pt reminded that hospital visitation restrictions are in effect and the importance of the restrictions.   Pt verbalized understanding of all pre-op instructions.  See PA, Anesthesiology, note.

## 2019-05-09 NOTE — Progress Notes (Signed)
Anesthesia Chart Review: [name]  Case:  [id number] Date/Time:  05/10/19 1415   Procedure:  AMPUTATION 2nd left TOE WITH METATARSAL (Left )   Anesthesia type:  Monitor Anesthesia Care   Pre-op diagnosis:  osteomyelitis, cellulitis, diabetic ulcer second toe left foot   Location:  [facility] OR ROOM 03 / [place] [facility]   Surgeon:  Landis Martins, DPM      DISCUSSION: 53 yo current smoker. PMHx significant for uncontrolled IDDMII,  HFrEF (normal coronaries by cath 2015),  R BKA in 03/2018, Remote ACDF, L Halux amputation 10/2018, OSA intolerant to CPAP, COPD, diabetic polyneuropathy, neurogenic bladder, colon CA, hx CVA, and extensive psychiatric hx requiring inpatient hospitalizations.  Follows with endocrinology for IDDM. Poorly controlled due to noncompliance and lack of followup. Last A1c 9.2 on 01/08/19.   Previously followed by cardiology for HFrEF and multiple CAD risk factors, apparently lost to followup. Cardiac catheterization on 02/12/14 after abnormal stress test which revealed normal coronary arteries. Ejection fraction appeared normal on catheterization. EF on echo 02/13/2014 was 45% with hypokinesis base inferior and basal inferolateral walls. Most recent echo 04/13/2018 showed EF 45-50% and again demonstrated Hypokinesis of the basalinferolateral, inferior, and inferoseptal myocardium; consistent with ischemia in the distribution of the right coronary artery; unchanged from the previous study.  No cardiology followup since 2017, however echo 03/2018 unchanged from previous study 01/2014 and at that time the pt had cath showing normal coronaries.   COVID negative 05/08/2019  Will need DOS eval by assigned anesthesiologist. Anticipate she can proceed as planned barring acute status change and DOS labs acceptable.   VS: There were no vitals taken for this visit.  PROVIDERS: Tamsen Roers, MD is PCP  Candee Furbish, MD is Cardiologist last seen 05/16/2016  Elayne Snare, MD is Endocrinologist  LABS:  Will need DOS labs  Labs Reviewed - No data to display   IMAGES: PORTABLE CHEST 1 VIEW 10/25/2018:  COMPARISON:  07/03/2018  FINDINGS: Heart, mediastinum and hila are within normal limits.  Clear lungs.  No pleural effusion or pneumothorax.  Status post previous anterior cervical spine fusion. Skeletal structures are grossly intact.  IMPRESSION: No active disease.  EKG: 10/25/2018: Normal sinus rhythm. Rate 92. Baseline wander   CV: TTE 04/13/2018: Study Conclusions  - Left ventricle: The cavity size was normal. Systolic function was   mildly reduced. The estimated ejection fraction was in the range   of 45% to 50%. Hypokinesis of the basalinferolateral, inferior,   and inferoseptal myocardium; consistent with ischemia in the   distribution of the right coronary artery; unchanged from the   previous study. Features are consistent with a pseudonormal left   ventricular filling pattern, with concomitant abnormal relaxation   and increased filling pressure (grade 2 diastolic dysfunction). - Pulmonary arteries: PA peak pressure: 31 mm Hg (S).  TTE 02/13/2014: Study Conclusions  - Left ventricle: Hypokinesis base inferior and base inferolateral walls. Overall EF seems better than 01/29/2014 study. Now 45%. The cavity size was normal. Wall thickness was normal. The estimated ejection fraction was 45%. - Right ventricle: The cavity size was normal. Systolic function was normal. - Impressions: No cardiac source of embolism was identified, but cannot be ruled out on the basis of this examination. Impressions:  Cath 02/12/2014: IMPRESSIONS:   1. No angiographically significant CAD 2. Normal left ventricular systolic function.  LVEDP 9 mmHg.  Ejection fraction 55 %.  RECOMMENDATION:  Reassuring cardiac catheterization. Her chest discomfort is secondary to musculoskeletal pain/fibromyalgia. No further cardiac  workup necessary.  Nuclear stress  01/08/2014: Impression: Exercise Capacity:  Lexiscan with no exercise. BP Response:  Hypertension at rest Clinical Symptoms:  No significant symptoms noted. ECG Impression:  No significant ECG changes with Lexiscan. Comparison with Prior Nuclear Study: No images to compare  LV Ejection Fraction: 47%.  LV Wall Motion:  Mild global LV hypokinesis.   Overall Impression:  Low risk stress nuclear study . Findings suggest nonischemic cardiomyopathy.  Past Medical History:  Diagnosis Date  . Anxiety   . Arthritis   . Bipolar disorder ([place])   . Broken jaw ([place])   . Broken wrist   . Chronic back pain   . Claudication ([place])    a. 12/2013 ABI's: R 0.97, L 0.94.  [name] Colon cancer ([place])   . COPD (chronic obstructive pulmonary disease) ([place])   . Depression   . Diabetic Charcot's foot ([place])   . Diabetic foot ulcer ([facility])    chronic left foot ulcer , great toe/  hx recurrent foot ulcer bilaterally  . DJD (degenerative joint disease)   . Edema of both lower extremities   . Episode of memory loss   . Family history of bladder cancer   . Family history of colon cancer   . Family history of kidney cancer   . GERD (gastroesophageal reflux disease)   . Headache(784.0)   . Hiatal hernia   . History of colon cancer, stage I dx 02-26-2015--- oncologist-- dr [name]--- per last note no recurrence   05-16-2015 s/p  Laparoscopic sigmoid colectomy w/ node bx's (negative nodes per path)  Stage I (pT1,N0,M0) Grade 2  . 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   per MRI multiple small strokes post cardiac cath. --  residual mild memory loss  . History of methicillin resistant staphylococcus aureus (MRSA)   . Hypercholesterolemia   . Insomnia   . Insulin dependent type 2 diabetes mellitus, uncontrolled ([place]) dx 2004   endocrinologist-  dr Dwyane Dee-- last A1c 8.8 in Aug2018:  pt is noncompliant w/ diet, states does note eat breakfast , her first main meal in afternoon  . Neurogenic  bladder   . Neuropathy, diabetic ([place])    hands and feet  . OSA (obstructive sleep apnea)    cpap intolerant  . Personality disorder ([place])   . Restless leg syndrome   . SOB (shortness of breath) on exertion   . Stroke ([place])   . SUI (stress urinary incontinence, female) S/P SLING 12-29-2011    Past Surgical History:  Procedure Laterality Date  . AMPUTATION Right 04/13/2018   Procedure: RIGHT BELOW KNEE AMPUTATION;  Surgeon: Newt Minion, MD;  Location: [facility];  Service: Orthopedics;  Laterality: Right;  . AMPUTATION Left 10/26/2018   Procedure: LEFT FOOT FIRST RAY AMPUTATION;  Surgeon: Newt Minion, MD;  Location: [facility];  Service: Orthopedics;  Laterality: Left;  . ANTERIOR CERVICAL DECOMP/DISCECTOMY FUSION  2000   C5 - 7  . APPLICATION OF WOUND VAC Left 10/26/2018   Procedure: APPLICATION OF WOUND VAC;  Surgeon: Newt Minion, MD;  Location: [facility];  Service: Orthopedics;  Laterality: Left;  . BACK SURGERY    . CARDIAC CATHETERIZATION  05-22-2008   DR SKAINS   NO SIGNIFECANT CAD/ NORMAL LV/  EF 65%/  NO WALL MOTION ABNORMALITIES  . CARPAL TUNNEL RELEASE Right 04-25-2013  . CATARACT EXTRACTION Right 10/24/2018  . [facility]; 1992  . COLON SURGERY    . COLONOSCOPY    .  CYSTO N/A 04/30/2013   Procedure: CYSTOSCOPY;  Surgeon: Reece Packer, MD;  Location: [facility];  Service: Urology;  Laterality: N/A;  . CYSTOSCOPY MACROPLASTIQUE IMPLANT N/A 02/06/2018   Procedure: CYSTOSCOPY MACROPLASTIQUE IMPLANT;  Surgeon: Bjorn Loser, MD;  [facility];  Service: Urology;  Laterality: N/A;  . CYSTOSCOPY WITH INJECTION  05/04/2012   Procedure: CYSTOSCOPY WITH INJECTION;  Surgeon: Reece Packer, MD;  Location: [facility];  Service: Urology;  Laterality: N/A;  MACROPLASTIQUE INJECTION  . CYSTOSCOPY WITH INJECTION  08/28/2012   Procedure: CYSTOSCOPY WITH INJECTION;  Surgeon: Reece Packer, MD;  Location: [facility];   Service: Urology;  Laterality: N/A;  cysto and macroplastique   . FOOT SURGERY Bilateral    "related to [place]"  . HERNIA REPAIR  ?1996   "stomach"  . I&D EXTREMITY Left 10/25/2018   Procedure: IRRIGATION AND DEBRIDEMENT GREAT TOE;  Surgeon: Meredith Pel, MD;  Location: [facility];  Service: Orthopedics;  Laterality: Left;  . INCISION AND DRAINAGE Left 10/25/2018   GREAT TOE  . INCISION AND DRAINAGE OF WOUND Right 04/11/2018   Procedure: IRRIGATION AND DEBRIDEMENT WOUND right foot and right ankle;  Surgeon: Evelina Bucy, DPM;  Location: [facility];  Service: Podiatry;  Laterality: Right;  . KNEE ARTHROSCOPY Left   . KNEE ARTHROSCOPY W/ ALLOGRAFT IMPANT Left    graft x 2  . KNEE SURGERY     TOTAL 8 SURG'S  . LAPAROSCOPIC SIGMOID COLECTOMY N/A 04/22/2015   Procedure: LAPAROSCOPIC HAND ASSISTED SIGMOID COLECTOMY;  Surgeon: Erroll Luna, MD;  Location: [facility];  Service: General;  Laterality: N/A;  . LEFT HEART CATHETERIZATION WITH CORONARY ANGIOGRAM N/A 02/12/2014   Procedure: LEFT HEART CATHETERIZATION WITH CORONARY ANGIOGRAM;  Surgeon: Candee Furbish, MD;  Location: [facility];  Service: Cardiovascular;  Laterality: N/A;   No angiographically significant CAD; normal LVSF, LVEDP 56mmHg,  EF 55% (new finding ef 30% myoview 01-08-2014)  . LUMBAR FUSION    . MANDIBLE FRACTURE SURGERY    . MULTIPLE LAPAROSCOPIES FOR ENDOMETRIOSIS    . PUBOVAGINAL SLING  12/29/2011   Procedure: Gaynelle Arabian;  Surgeon: Reece Packer, MD;  Location[facility];  Service: Urology;  Laterality: N/A;  cysto and sparc sling   . PUBOVAGINAL SLING N/A 04/30/2013   Procedure: REMOVAL OF VAGINAL MESH;  Surgeon: Reece Packer, MD;  Location[facility];  Service: Urology;  Laterality: N/A;  . RECONSTURCTION OF CONGENITAL UTERUS ANOMALY  1983  . REPEAT RECONSTRUCTION ACL LEFT KNEE/ SCREWS REMOVED  03-28-2000   CADAVER GRAFT  . TOTAL ABDOMINAL HYSTERECTOMY  1997   w/BSO  . TRANSTHORACIC  ECHOCARDIOGRAM  02/13/2014   ef 45%, hypokinesis base inferior and base inferolateral walls  . UPPER GI ENDOSCOPY    . WOUND DEBRIDEMENT Right 09/05/2016   Procedure: DEBRIDEMENT WOUND WITH GRAFT RIGHT FOOT;  Surgeon: Landis Martins, DPM;  Location: [facility];  Service: Podiatry;  Laterality: Right;  . WRIST FRACTURE SURGERY      MEDICATIONS: No current facility-administered medications for this encounter.    [name] amLODipine-benazepril (LOTREL) 5-10 MG capsule  . Armodafinil 250 MG tablet  . aspirin-acetaminophen-caffeine (EXCEDRIN MIGRAINE) 250-250-65 MG tablet  . diazepam (VALIUM) 5 MG tablet  . esomeprazole (NEXIUM) 40 MG capsule  . gabapentin (NEURONTIN) 600 MG tablet  . HETLIOZ 20 MG CAPS  . INGREZZA 80 MG CAPS  . Insulin Glargine (BASAGLAR KWIKPEN) 100 UNIT/ML SOPN  . JARDIANCE 10 MG TABS  tablet  . metFORMIN (GLUCOPHAGE-XR) 750 MG 24 hr tablet  . oxyCODONE-acetaminophen (PERCOCET) 10-325 MG tablet  . QUEtiapine (SEROQUEL) 300 MG tablet  . sulfamethoxazole-trimethoprim (BACTRIM) 400-80 MG tablet  . VRAYLAR 6 MG CAPS  . glucose blood (FREESTYLE TEST STRIPS) test strip  . insulin aspart (NOVOLOG FLEXPEN) 100 UNIT/ML FlexPen    Wynonia Musty [facility] Short Stay Center/Anesthesiology Phone [phone] 05/09/2019 10:13 AM

## 2019-05-10 ENCOUNTER — Encounter (HOSPITAL_COMMUNITY)
Admission: RE | Disposition: A | Discharge status: Home / Self Care | Payer: Self-pay | Source: Home / Self Care | Attending: Sports Medicine

## 2019-05-10 ENCOUNTER — Ambulatory Visit (HOSPITAL_COMMUNITY)
Admission: RE | Admit: 2019-05-10 | Discharge: 2019-05-10 | Disposition: A | Discharge status: Home / Self Care | Payer: Medicare Other | Source: Home / Self Care | Attending: Sports Medicine | Admitting: Sports Medicine

## 2019-05-10 ENCOUNTER — Ambulatory Visit (HOSPITAL_COMMUNITY): Payer: Medicare Other | Admitting: Physician Assistant

## 2019-05-10 ENCOUNTER — Ambulatory Visit (HOSPITAL_COMMUNITY)
Admission: RE | Admit: 2019-05-10 | Discharge: 2019-05-10 | Disposition: A | Discharge status: Home / Self Care | Payer: Medicare Other | Attending: Sports Medicine | Admitting: Sports Medicine

## 2019-05-10 ENCOUNTER — Ambulatory Visit: Payer: Medicare Other | Admitting: Sports Medicine

## 2019-05-10 ENCOUNTER — Encounter (HOSPITAL_COMMUNITY): Payer: Self-pay | Admitting: Anesthesiology

## 2019-05-10 DIAGNOSIS — E1169 Type 2 diabetes mellitus with other specified complication: Secondary | ICD-10-CM | POA: Insufficient documentation

## 2019-05-10 DIAGNOSIS — E7800 Pure hypercholesterolemia, unspecified: Secondary | ICD-10-CM | POA: Diagnosis not present

## 2019-05-10 DIAGNOSIS — K219 Gastro-esophageal reflux disease without esophagitis: Secondary | ICD-10-CM | POA: Insufficient documentation

## 2019-05-10 DIAGNOSIS — M86672 Other chronic osteomyelitis, left ankle and foot: Secondary | ICD-10-CM | POA: Diagnosis not present

## 2019-05-10 DIAGNOSIS — Z888 Allergy status to other drugs, medicaments and biological substances status: Secondary | ICD-10-CM | POA: Insufficient documentation

## 2019-05-10 DIAGNOSIS — E1151 Type 2 diabetes mellitus with diabetic peripheral angiopathy without gangrene: Secondary | ICD-10-CM | POA: Diagnosis not present

## 2019-05-10 DIAGNOSIS — Z79891 Long term (current) use of opiate analgesic: Secondary | ICD-10-CM | POA: Diagnosis not present

## 2019-05-10 DIAGNOSIS — E785 Hyperlipidemia, unspecified: Secondary | ICD-10-CM | POA: Insufficient documentation

## 2019-05-10 DIAGNOSIS — E1122 Type 2 diabetes mellitus with diabetic chronic kidney disease: Secondary | ICD-10-CM | POA: Diagnosis not present

## 2019-05-10 DIAGNOSIS — N186 End stage renal disease: Secondary | ICD-10-CM | POA: Insufficient documentation

## 2019-05-10 DIAGNOSIS — Z9889 Other specified postprocedural states: Secondary | ICD-10-CM

## 2019-05-10 DIAGNOSIS — G2581 Restless legs syndrome: Secondary | ICD-10-CM | POA: Diagnosis not present

## 2019-05-10 DIAGNOSIS — F419 Anxiety disorder, unspecified: Secondary | ICD-10-CM | POA: Insufficient documentation

## 2019-05-10 DIAGNOSIS — G4733 Obstructive sleep apnea (adult) (pediatric): Secondary | ICD-10-CM | POA: Insufficient documentation

## 2019-05-10 DIAGNOSIS — Z794 Long term (current) use of insulin: Secondary | ICD-10-CM | POA: Diagnosis not present

## 2019-05-10 DIAGNOSIS — L97529 Non-pressure chronic ulcer of other part of left foot with unspecified severity: Secondary | ICD-10-CM | POA: Diagnosis present

## 2019-05-10 DIAGNOSIS — L02818 Cutaneous abscess of other sites: Secondary | ICD-10-CM | POA: Diagnosis not present

## 2019-05-10 DIAGNOSIS — Z89511 Acquired absence of right leg below knee: Secondary | ICD-10-CM | POA: Diagnosis not present

## 2019-05-10 DIAGNOSIS — Z79899 Other long term (current) drug therapy: Secondary | ICD-10-CM | POA: Insufficient documentation

## 2019-05-10 DIAGNOSIS — M869 Osteomyelitis, unspecified: Secondary | ICD-10-CM | POA: Diagnosis not present

## 2019-05-10 DIAGNOSIS — Z8673 Personal history of transient ischemic attack (TIA), and cerebral infarction without residual deficits: Secondary | ICD-10-CM | POA: Insufficient documentation

## 2019-05-10 DIAGNOSIS — L03032 Cellulitis of left toe: Secondary | ICD-10-CM | POA: Insufficient documentation

## 2019-05-10 DIAGNOSIS — F17210 Nicotine dependence, cigarettes, uncomplicated: Secondary | ICD-10-CM | POA: Insufficient documentation

## 2019-05-10 DIAGNOSIS — Z886 Allergy status to analgesic agent status: Secondary | ICD-10-CM | POA: Diagnosis not present

## 2019-05-10 DIAGNOSIS — E11621 Type 2 diabetes mellitus with foot ulcer: Secondary | ICD-10-CM

## 2019-05-10 DIAGNOSIS — E1142 Type 2 diabetes mellitus with diabetic polyneuropathy: Secondary | ICD-10-CM | POA: Diagnosis not present

## 2019-05-10 HISTORY — PX: AMPUTATION TOE: SHX6595

## 2019-05-10 LAB — CBC
HCT: 34.2 % — ABNORMAL LOW (ref 36.0–46.0)
Hemoglobin: 11.4 g/dL — ABNORMAL LOW (ref 12.0–15.0)
MCH: 30.1 pg (ref 26.0–34.0)
MCHC: 33.3 g/dL (ref 30.0–36.0)
MCV: 90.2 fL (ref 80.0–100.0)
Platelets: 435 10*3/uL — ABNORMAL HIGH (ref 150–400)
RBC: 3.79 MIL/uL — ABNORMAL LOW (ref 3.87–5.11)
RDW: 12.1 % (ref 11.5–15.5)
WBC: 9.6 10*3/uL (ref 4.0–10.5)
nRBC: 0 % (ref 0.0–0.2)

## 2019-05-10 LAB — BASIC METABOLIC PANEL
Anion gap: 11 (ref 5–15)
BUN: <5 mg/dL — ABNORMAL LOW (ref 6–20)
CO2: 21 mmol/L — ABNORMAL LOW (ref 22–32)
Calcium: 8.9 mg/dL (ref 8.9–10.3)
Chloride: 104 mmol/L (ref 98–111)
Creatinine, Ser: 0.53 mg/dL (ref 0.44–1.00)
GFR calc Af Amer: >60 mL/min (ref >60)
GFR calc non Af Amer: >60 mL/min (ref >60)
Glucose, Bld: 166 mg/dL — ABNORMAL HIGH (ref 70–99)
Potassium: 3.7 mmol/L (ref 3.5–5.1)
Sodium: 136 mmol/L (ref 135–145)

## 2019-05-10 LAB — GLUCOSE, CAPILLARY
Glucose-Capillary: 152 mg/dL — ABNORMAL HIGH (ref 70–99)
Glucose-Capillary: 117 mg/dL — ABNORMAL HIGH (ref 70–99)

## 2019-05-10 SURGERY — AMPUTATION, TOE
Anesthesia: Monitor Anesthesia Care | Site: Foot | Laterality: Left

## 2019-05-10 MED ORDER — FENTANYL CITRATE (PF) 250 MCG/5ML IJ SOLN
INTRAMUSCULAR | Status: AC
  Filled 2019-05-10: qty 5

## 2019-05-10 MED ORDER — PROPOFOL 500 MG/50ML IV EMUL
INTRAVENOUS | Status: DC | PRN
  Administered 2019-05-10: 100 ug/kg/min via INTRAVENOUS

## 2019-05-10 MED ORDER — LIDOCAINE HCL (PF) 1 % IJ SOLN
INTRAMUSCULAR | Status: AC
  Filled 2019-05-10: qty 30

## 2019-05-10 MED ORDER — PROPOFOL 1000 MG/100ML IV EMUL
INTRAVENOUS | Status: AC
  Filled 2019-05-10: qty 100

## 2019-05-10 MED ORDER — ONDANSETRON HCL 4 MG/2ML IJ SOLN
INTRAMUSCULAR | Status: AC
  Filled 2019-05-10: qty 2

## 2019-05-10 MED ORDER — OXYCODONE-ACETAMINOPHEN 10-325 MG PO TABS: 1.0000 | ORAL_TABLET | Freq: Four times a day (QID) | ORAL | 0 refills | Status: AC | PRN

## 2019-05-10 MED ORDER — BUPIVACAINE HCL (PF) 0.25 % IJ SOLN
INTRAMUSCULAR | Status: AC
  Filled 2019-05-10: qty 30

## 2019-05-10 MED ORDER — PROPOFOL 10 MG/ML IV BOLUS
INTRAVENOUS | Status: AC
  Filled 2019-05-10: qty 20

## 2019-05-10 MED ORDER — BUPIVACAINE HCL 0.25 % IJ SOLN
INTRAMUSCULAR | Status: DC | PRN
  Administered 2019-05-10: 5 mL

## 2019-05-10 MED ORDER — PROPOFOL 10 MG/ML IV BOLUS
INTRAVENOUS | Status: DC | PRN
  Administered 2019-05-10: 30 mg via INTRAVENOUS

## 2019-05-10 MED ORDER — HYDROCODONE-ACETAMINOPHEN 7.5-325 MG PO TABS: 1.0000 | ORAL_TABLET | Freq: Once | ORAL | Status: DC | PRN

## 2019-05-10 MED ORDER — FENTANYL CITRATE (PF) 100 MCG/2ML IJ SOLN: 25.0000 ug | INTRAMUSCULAR | Status: DC | PRN

## 2019-05-10 MED ORDER — ONDANSETRON HCL 4 MG/2ML IJ SOLN
INTRAMUSCULAR | Status: DC | PRN
  Administered 2019-05-10: 4 mg via INTRAVENOUS

## 2019-05-10 MED ORDER — PROMETHAZINE HCL 25 MG PO TABS: 25.0000 mg | ORAL_TABLET | Freq: Four times a day (QID) | ORAL | 0 refills | Status: DC | PRN

## 2019-05-10 MED ORDER — CHLORHEXIDINE GLUCONATE CLOTH 2 % EX PADS: 6.0000 | MEDICATED_PAD | Freq: Once | CUTANEOUS | Status: DC

## 2019-05-10 MED ORDER — ONDANSETRON HCL 4 MG/2ML IJ SOLN: 4.0000 mg | Freq: Once | INTRAMUSCULAR | Status: DC | PRN

## 2019-05-10 MED ORDER — 0.9 % SODIUM CHLORIDE (POUR BTL) OPTIME
TOPICAL | Status: DC | PRN
  Administered 2019-05-10: 1000 mL

## 2019-05-10 MED ORDER — LEVOFLOXACIN 500 MG PO TABS: 500.0000 mg | ORAL_TABLET | Freq: Every day | ORAL | 0 refills | Status: AC

## 2019-05-10 MED ORDER — LIDOCAINE HCL (PF) 1 % IJ SOLN
INTRAMUSCULAR | Status: DC | PRN
  Administered 2019-05-10: 5 mL

## 2019-05-10 MED ORDER — DOCUSATE SODIUM 100 MG PO CAPS: 100.0000 mg | ORAL_CAPSULE | Freq: Every day | ORAL | 2 refills | Status: DC | PRN

## 2019-05-10 MED ORDER — FENTANYL CITRATE (PF) 250 MCG/5ML IJ SOLN
INTRAMUSCULAR | Status: DC | PRN
  Administered 2019-05-10: 50 ug via INTRAVENOUS

## 2019-05-10 MED ORDER — MIDAZOLAM HCL 2 MG/2ML IJ SOLN
INTRAMUSCULAR | Status: DC | PRN
  Administered 2019-05-10: 2 mg via INTRAVENOUS

## 2019-05-10 MED ORDER — LACTATED RINGERS IV SOLN
INTRAVENOUS | Status: DC
  Administered 2019-05-10: 13:00:00 via INTRAVENOUS

## 2019-05-10 MED ORDER — MIDAZOLAM HCL 2 MG/2ML IJ SOLN
INTRAMUSCULAR | Status: AC
  Filled 2019-05-10: qty 2

## 2019-05-10 SURGICAL SUPPLY — 36 items
BANDAGE ACE 4X5 VEL STRL LF (GAUZE/BANDAGES/DRESSINGS) ×1 IMPLANT
BLADE SAW SAG 9X24 (BLADE) ×3 IMPLANT
BLADE SURG 10 STRL SS (BLADE) ×3 IMPLANT
BNDG CMPR 9X4 STRL LF SNTH (GAUZE/BANDAGES/DRESSINGS) ×1
BNDG COHESIVE 4X5 TAN STRL (GAUZE/BANDAGES/DRESSINGS) ×3 IMPLANT
BNDG ESMARK 4X9 LF (GAUZE/BANDAGES/DRESSINGS) ×3 IMPLANT
BNDG GAUZE ELAST 4 BULKY (GAUZE/BANDAGES/DRESSINGS) ×3 IMPLANT
COVER SURGICAL LIGHT HANDLE (MISCELLANEOUS) ×3 IMPLANT
COVER WAND RF STERILE (DRAPES) ×3 IMPLANT
CUFF TOURNIQUET SINGLE 18IN (TOURNIQUET CUFF) ×1 IMPLANT
CUFF TOURNIQUET SINGLE 24IN (TOURNIQUET CUFF) ×1 IMPLANT
DRSG ADAPTIC 3X8 NADH LF (GAUZE/BANDAGES/DRESSINGS) ×2 IMPLANT
DRSG PAD ABDOMINAL 8X10 ST (GAUZE/BANDAGES/DRESSINGS) ×1 IMPLANT
GAUZE SPONGE 4X4 12PLY STRL (GAUZE/BANDAGES/DRESSINGS) ×2 IMPLANT
GAUZE SPONGE 4X4 12PLY STRL LF (GAUZE/BANDAGES/DRESSINGS) ×1 IMPLANT
GLOVE BIO SURGEON STRL SZ 6.5 (GLOVE) ×2 IMPLANT
GLOVE BIO SURGEONS STRL SZ 6.5 (GLOVE) ×1
GLOVE INDICATOR 6.5 STRL GRN (GLOVE) ×3 IMPLANT
GOWN STRL REUS W/ TWL LRG LVL3 (GOWN DISPOSABLE) ×2 IMPLANT
GOWN STRL REUS W/TWL LRG LVL3 (GOWN DISPOSABLE) ×6
KIT BASIN OR (CUSTOM PROCEDURE TRAY) ×3 IMPLANT
KIT TURNOVER KIT B (KITS) ×3 IMPLANT
NEEDLE 25GAX1.5 (MISCELLANEOUS) ×3 IMPLANT
NS IRRIG 1000ML POUR BTL (IV SOLUTION) ×3 IMPLANT
PACK ORTHO EXTREMITY (CUSTOM PROCEDURE TRAY) ×3 IMPLANT
PAD ABD 8X10 STRL (GAUZE/BANDAGES/DRESSINGS) ×2 IMPLANT
PAD ARMBOARD 7.5X6 YLW CONV (MISCELLANEOUS) ×6 IMPLANT
SUT PROLENE 0 CT 2 (SUTURE) ×2 IMPLANT
SUT PROLENE 3 0 PS 2 (SUTURE) ×4 IMPLANT
SUT VIC AB 2-0 SH 27 (SUTURE) ×3
SUT VIC AB 2-0 SH 27XBRD (SUTURE) ×1 IMPLANT
SWAB COLLECTION DEVICE MRSA (MISCELLANEOUS) ×2 IMPLANT
SWAB CULTURE ESWAB REG 1ML (MISCELLANEOUS) ×2 IMPLANT
SYR CONTROL 10ML LL (SYRINGE) ×3 IMPLANT
TUBE CONNECTING 12'X1/4 (SUCTIONS) ×1
TUBE CONNECTING 12X1/4 (SUCTIONS) ×2 IMPLANT

## 2019-05-10 NOTE — Transfer of Care (Signed)
Immediate Anesthesia Transfer of Care Note  Patient: [name]  Procedure(s) Performed: AMPUTATION 2nd left TOE WITH METATARSAL (Left Foot)  Patient Location: PACU  Anesthesia Type:General  Level of Consciousness: awake, alert  and oriented  Airway & Oxygen Therapy: Patient Spontanous Breathing and Patient connected to face mask oxygen  Post-op Assessment: Report given to RN and Post -op Vital signs reviewed and stable  Post vital signs: Reviewed and stable  Last Vitals:  Vitals Value Taken Time  BP 118/72 05/10/2019  3:36 PM  Temp    Pulse 79 05/10/2019  3:38 PM  Resp 15 05/10/2019  3:38 PM  SpO2 100 % 05/10/2019  3:38 PM  Vitals shown include unvalidated device data.  Last Pain:  Vitals:   05/10/19 1229  TempSrc: Oral         Complications: No apparent anesthesia complications

## 2019-05-10 NOTE — Brief Op Note (Signed)
05/10/2019  3:35 PM  PATIENT:  [name]  53 y.o. female  PRE-OPERATIVE DIAGNOSIS:  osteomyelitis, cellulitis, diabetic ulcer second toe left foot  POST-OPERATIVE DIAGNOSIS:  osteomyelitis, cellulitis, diabetic ulcer second toe left foot  PROCEDURE:  Procedure(s): AMPUTATION 2nd left TOE WITH METATARSAL (Left)  SURGEON:  Surgeon(s) and Role:     Landis Martins, DPM - Primary  PHYSICIAN ASSISTANT:   ASSISTANTS: none   ANESTHESIA:   local  EBL:  10 mL   BLOOD ADMINISTERED:none  DRAINS: none   LOCAL MEDICATIONS USED:  MARCAINE  & LIDOCAINE  SPECIMEN:  Source of Specimen:  Left 2nd toe and distal metatarsal   DISPOSITION OF SPECIMEN:  PATHOLOGY  COUNTS:  YES  TOURNIQUET:   Total Tourniquet Time Documented: Calf (Left) - 29 minutes Total: Calf (Left) - 29 minutes   DICTATION: .Note written in EPIC  PLAN OF CARE: Discharge to home after PACU  PATIENT DISPOSITION:  PACU - hemodynamically stable.   Delay start of Pharmacological VTE agent (>24hrs) due to surgical blood loss or risk of bleeding: no

## 2019-05-10 NOTE — H&P (Signed)
HPI:  This 53 y.o. Diabetic female patient seen at bedside pre-op is well known to my outpatient clinic; seen on several occassions for Left foot infection. Patient has been on a course of PO antibiotics with home nursing with no improvement of Left 2nd toe. Patient elected for surgical amputation of toe due to continued cellulitis and wound with clinical osteomyelitis.  This PM patient denies any other pedal complaints. Denies Nausea/fever/vomiting/chills/overnight events. Confirms NPO since midnight.     Patient Active Problem List   Diagnosis Date Noted  . Genetic testing 03/12/2019  . Family history of colon cancer   . Family history of bladder cancer   . Family history of kidney cancer   . Postmenopausal bleeding 11/15/2018  . Dog bite of foot, sequela   . Left foot infection 10/25/2018  . Gangrenous disorder ([place]) 09/17/2018  . Cellulitis of left upper extremity   . Poorly controlled diabetes mellitus ([place])   . UTI (urinary tract infection) 05/23/2018  . Acute metabolic encephalopathy 54/62/7035  . GERD (gastroesophageal reflux disease) 05/09/2018  . Tobacco abuse 05/09/2018  . Polypharmacy 05/09/2018  . Restless leg syndrome 04/27/2018  . Anxiety 04/27/2018  . Hyponatremia 04/26/2018  . Noncompliance with dietary restriction 04/26/2018  . Postoperative cellulitis of surgical wound 04/26/2018  . At high risk for falls 04/24/2018  . Noncompliance with safety precautions 04/24/2018  . Leukocytosis 04/24/2018  . 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 PAD (peripheral artery disease) ([place])   . Poorly controlled type 2 diabetes mellitus ([place])   . Subacute osteomyelitis, right ankle and foot ([place])   . Major depressive disorder, recurrent episode ([place]) 04/11/2018  . Diabetic ulcer of right midfoot associated with type 2 diabetes mellitus, with necrosis of bone ([place])   . Abscess of ankle   . Infectious synovitis   . Diabetic foot infection ([place])   . Sepsis ([place]) 04/09/2018   . Melanocytic nevus 02/28/2018  . Lesion of labia 09/27/2017  . Vaginitis and vulvovaginitis 02/19/2016  . Cancer of sigmoid colon ([place]) 03/09/2015  . History of stroke within last year 02/12/2015  . OSA (obstructive sleep apnea) 07/04/2014  . Migraine without aura, with intractable migraine, so stated, without mention of status migrainosus 03/26/2014  . Peripheral neuropathy 03/03/2014  . Abnormal stress test 02/12/2014  . CVA (cerebral infarction) 02/12/2014  . Chest pain   . Abnormal heart rhythm   . Edema   . Hypertension   . Hyperlipemia   . Hypercholesterolemia   . Type II diabetes mellitus, uncontrolled ([place]) 07/22/2013  . Diabetic neuropathy with neurologic complication ([place]) 00/93/8182  . Diabetic polyneuropathy ([place]) 07/22/2013      Past Surgical History:  Procedure Laterality Date  . AMPUTATION Right 04/13/2018   Procedure: RIGHT BELOW KNEE AMPUTATION;  Surgeon: Newt Minion, MD;  Location: [place];  Service: Orthopedics;  Laterality: Right;  . AMPUTATION Left 10/26/2018   Procedure: LEFT FOOT FIRST RAY AMPUTATION;  Surgeon: Newt Minion, MD;  Location: [place];  Service: Orthopedics;  Laterality: Left;  . ANTERIOR CERVICAL DECOMP/DISCECTOMY FUSION  2000   C5 - 7  . APPLICATION OF WOUND VAC Left 10/26/2018   Procedure: APPLICATION OF WOUND VAC;  Surgeon: Newt Minion, MD;  Location: [place];  Service: Orthopedics;  Laterality: Left;  . BACK SURGERY    . CARDIAC CATHETERIZATION  05-22-2008   DR SKAINS   NO SIGNIFECANT CAD/ NORMAL LV/  EF 65%/  NO WALL MOTION ABNORMALITIES  . CARPAL TUNNEL RELEASE Right  04-25-2013  . CATARACT EXTRACTION Right 10/24/2018  . [place]; 1992  . COLON SURGERY    . COLONOSCOPY    . CYSTO N/A 04/30/2013   Procedure: CYSTOSCOPY;  Surgeon: Reece Packer, MD;  Location: [facility];  Service: Urology;  Laterality: N/A;  . CYSTOSCOPY MACROPLASTIQUE IMPLANT N/A 02/06/2018   Procedure: CYSTOSCOPY MACROPLASTIQUE IMPLANT;  Surgeon:  Bjorn Loser, MD;  Location: [facility];  Service: Urology;  Laterality: N/A;  . CYSTOSCOPY WITH INJECTION  05/04/2012   Procedure: CYSTOSCOPY WITH INJECTION;  Surgeon: Reece Packer, MD;  Location: [place];  Service: Urology;  Laterality: N/A;  MACROPLASTIQUE INJECTION  . CYSTOSCOPY WITH INJECTION  08/28/2012   Procedure: CYSTOSCOPY WITH INJECTION;  Surgeon: Reece Packer, MD;  Location: [facility];  Service: Urology;  Laterality: N/A;  cysto and macroplastique   . FOOT SURGERY Bilateral    "related to [place]"  . HERNIA REPAIR  ?1996   "stomach"  . I&D EXTREMITY Left 10/25/2018   Procedure: IRRIGATION AND DEBRIDEMENT GREAT TOE;  Surgeon: Meredith Pel, MD;  Location: [place];  Service: Orthopedics;  Laterality: Left;  . INCISION AND DRAINAGE Left 10/25/2018   GREAT TOE  . INCISION AND DRAINAGE OF WOUND Right 04/11/2018   Procedure: IRRIGATION AND DEBRIDEMENT WOUND right foot and right ankle;  Surgeon: Evelina Bucy, DPM;  Location: [facility];  Service: Podiatry;  Laterality: Right;  . KNEE ARTHROSCOPY Left   . KNEE ARTHROSCOPY W/ ALLOGRAFT IMPANT Left    graft x 2  . KNEE SURGERY     TOTAL 8 SURG'S  . LAPAROSCOPIC SIGMOID COLECTOMY N/A 04/22/2015   Procedure: LAPAROSCOPIC HAND ASSISTED SIGMOID COLECTOMY;  Surgeon: Erroll Luna, MD;  Location: [place];  Service: General;  Laterality: N/A;  . LEFT HEART CATHETERIZATION WITH CORONARY ANGIOGRAM N/A 02/12/2014   Procedure: LEFT HEART CATHETERIZATION WITH CORONARY ANGIOGRAM;  Surgeon: Candee Furbish, MD;  Location: [facility] C[facility];  Service: Cardiovascular;  Laterality: N/A;   No angiographically significant CAD; normal LVSF, LVEDP 43mmHg,  EF 55% (new finding ef 30% myoview 01-08-2014)  . LUMBAR FUSION    . MANDIBLE FRACTURE SURGERY    . MULTIPLE LAPAROSCOPIES FOR ENDOMETRIOSIS    . PUBOVAGINAL SLING  12/29/2011   Procedure: Gaynelle Arabian;  Surgeon: Reece Packer, MD;  Location:  [facility];  Service: Urology;  Laterality: N/A;  cysto and sparc sling   . PUBOVAGINAL SLING N/A 04/30/2013   Procedure: REMOVAL OF VAGINAL MESH;  Surgeon: Reece Packer, MD;  Location: [facility];  Service: Urology;  Laterality: N/A;  . RECONSTURCTION OF CONGENITAL UTERUS ANOMALY  1983  . REPEAT RECONSTRUCTION ACL LEFT KNEE/ SCREWS REMOVED  03-28-2000   CADAVER GRAFT  . TOTAL ABDOMINAL HYSTERECTOMY  1997   w/BSO  . TRANSTHORACIC ECHOCARDIOGRAM  02/13/2014   ef 45%, hypokinesis base inferior and base inferolateral walls  . UPPER GI ENDOSCOPY    . WOUND DEBRIDEMENT Right 09/05/2016   Procedure: DEBRIDEMENT WOUND WITH GRAFT RIGHT FOOT;  Surgeon: Landis Martins, DPM;  Location: [place];  Service: Podiatry;  Laterality: Right;  . WRIST FRACTURE SURGERY      Family History  Problem Relation Age of Onset  . Hypertension Mother   . Diabetes Mother   . Cancer - Other Mother        lymphoma   . Cancer - Other Father        lung, bladder cancer   .  Heart attack Father   . Other Father        Lynch syndrome  . Cancer - Other Brother        bladder cancer   . Bladder Cancer Paternal Aunt        Lynch syndrome  . Other Paternal Uncle        Lynch syndrome  . Other Cousin        Lynch syndrome    Social History   Socioeconomic History  . Marital status: Married    Spouse name: Not on file  . Number of children: 2  . Years of education: 6th  . Highest education level: Not on file  Occupational History  . Not on file  Social Needs  . Financial resource strain: Not on file  . Food insecurity:    Worry: Not on file    Inability: Not on file  . Transportation needs:    Medical: Not on file    Non-medical: Not on file  Tobacco Use  . Smoking status: Current Every Day Smoker    Packs/day: 2.00    Years: 46.00    Pack years: 92.00    Types: Cigarettes  . Smokeless tobacco: Never Used  . Tobacco comment: per pt started smoking  age 19  Substance and Sexual Activity   . Alcohol use: Yes    Comment:  "special occasions only"  . Drug use: Not Currently  . Sexual activity: Yes  Lifestyle  . Physical activity:    Days per week: Not on file    Minutes per session: Not on file  . Stress: Not on file  Relationships  . Social connections:    Talks on phone: Not on file    Gets together: Not on file    Attends religious service: Not on file    Active member of club or organization: Not on file    Attends meetings of clubs or organizations: Not on file    Relationship status: Not on file  . Intimate partner violence:    Fear of current or ex partner: Not on file    Emotionally abused: Not on file    Physically abused: Not on file    Forced sexual activity: Not on file  Other Topics Concern  . Not on file  Social History Narrative   Patient is divorced and lives alone- independent in ADLs, Drives   Patient has two adult children- grown son and daughter who is special needs in a home   Patient is disabled since 25   Patient has a 6th grade education.   Patient is right-handed.   Depression-medication and therapist   Patient drinks 2- 3 liters of soda and drinks tea but not everyday.    Does not routinely exercise.      Patient reports that she drinks "a lot" of caffeine daily     No current facility-administered medications on file prior to encounter.    Current Outpatient Medications on File Prior to Encounter  Medication Sig Dispense Refill  . amLODipine-benazepril (LOTREL) 5-10 MG capsule Take 1 capsule by mouth daily.     . Armodafinil 250 MG tablet Take 250 mg by mouth every morning.    [name] aspirin-acetaminophen-caffeine (EXCEDRIN MIGRAINE) 250-250-65 MG tablet Take 2 tablets by mouth every 6 (six) hours as needed for headache.    . diazepam (VALIUM) 5 MG tablet Take 5 mg by mouth 2 (two) times daily.     [name] esomeprazole (NEXIUM) 40 MG  capsule Take 40 mg by mouth daily.     [name] gabapentin (NEURONTIN) 600 MG tablet Take 1 tablet (600 mg total)  by mouth 2 (two) times daily. Take in the morning and afternoon. Do not take at night time. (Patient taking differently: Take 1,200 mg by mouth at bedtime. )    . HETLIOZ 20 MG CAPS Take 20 mg by mouth at bedtime.    . INGREZZA 80 MG CAPS Take 80 mg by mouth at bedtime.    . Insulin Glargine (BASAGLAR KWIKPEN) 100 UNIT/ML SOPN Inject 0.22 mLs (22 Units total) into the skin daily. (Patient taking differently: Inject 22 Units into the skin at bedtime. ) 15 mL 3  . JARDIANCE 10 MG TABS tablet TAKE 10 MG BY MOUTH DAILY WITH BREAKFAST. (Patient taking differently: Take 10 mg by mouth daily. ) 15 tablet 0  . metFORMIN (GLUCOPHAGE-XR) 750 MG 24 hr tablet Take 750 mg by mouth 2 (two) times daily.    [name] oxyCODONE-acetaminophen (PERCOCET) 10-325 MG tablet Take 1 tablet by mouth 4 (four) times daily.     . QUEtiapine (SEROQUEL) 300 MG tablet Take 600 mg by mouth at bedtime. One hour before bed  1  . sulfamethoxazole-trimethoprim (BACTRIM) 400-80 MG tablet Take 1 tablet by mouth 2 (two) times daily. 28 tablet 0  . VRAYLAR 6 MG CAPS Take 6 mg by mouth at bedtime.   1  . glucose blood (FREESTYLE TEST STRIPS) test strip USE TO CHECK BLOOD SUGARS DAILY. DX CODE E11.65 50 each 3  . insulin aspart (NOVOLOG FLEXPEN) 100 UNIT/ML FlexPen Inject 6 units with each meal daily and use sliding scale for further dosage (Patient not taking: Reported on 05/07/2019) 15 mL 3    Allergies  Allergen Reactions  . Latex Itching, Rash and Other (See Comments)    Pt states she cannot use condoms - cause an infection.  Use of latex on skin is okay for short period of time.  Tape causes rash  . Sweetening Enhancer [Flavoring Agent] Nausea And Vomiting and Other (See Comments)    HEADACHES  . Ropinirole     Pt stated, "I am getting cramps in my legs - especially at night"  . Aspartame And Phenylalanine Nausea And Vomiting    HEADACHES  . Ibuprofen Other (See Comments)    HEADACHES  . Trazodone And Nefazodone Other (See Comments)     Hallucinations      REVIEW OF SYSTEMS:  Admits night sweats over the last 2 nights but no fever.   PHYSICAL EXAMINATION:  Today's Vitals   05/10/19 1229  BP: (!) 174/60  Pulse: 85  Resp: 18  Temp: 98.9 F (37.2 C)  TempSrc: Oral  SpO2: 98%  Weight: 80.3 kg  Height: 5\' 5"  (1.651 m)    GENERAL:  Well-developed, well-nourished, in no acute distress.  Alert  and cooperative. FOCUSED FOOT EXAM (No changes from prior) Integument: Skin is warm, dry and supple. Nails are tender, short, thickened and dystrophic with subungual debris, consistent with onychomycosis, 2-5 on left.  There is a full-thickness ulceration at the left second toe dorsal interphalangeal joint with tendon exposure that measures larger 0.7 cm with warmth redness swelling and decreased macerated skin around the whole entire circumference of the toe.  There is erythema to the level of the metatarsal phalangeal joint however with clear to yellow drainage and decreased malodor.  Previous hallux amputation surgical site is well-healed.  Remaining integument unremarkable.   Vasculature:  Dorsalis Pedis pulse 1/4  left. Posterior Tibial pulse 1/4 left.  Capillary fill time <5 sec 1-5 on left   Neurology: Absent protective sensation on left   Musculoskeletal:s/p R BKA. Hammertoe deformity on left.  Status post left hallux amputation.  ASSESSMENTS:  Left 2nd toe ulceration with bone exposure (osteomyelitis) Cellulitis of toe Abscess of toe Diabetic with Neuropathy PAD  S/p R BKA and previous L hallux amputation     PLAN OF CARE: Patient seen and evaluated 1. History and physical completed 2. Patient confirms NPO since midnight 3. Previous Imaging reviewed    4. Consent for surgery explained and obtained; risk and benefits explained; all questions answered and no guarantees granted. 5. Patient to undergo Left 2nd toe and distal metatarsal amputation surgical procedure 6. Case discussed with patient and staff 7.  Anticipate discharge to home same day. Will continue to follow closely/ see post-operative in office as scheduled next week. All post op medications will be E-scribed.   Landis Martins, DPM

## 2019-05-10 NOTE — H&P (Signed)
Anesthesia H&P Update: History and Physical Exam reviewed; patient is OK for planned anesthetic and procedure. ? ?

## 2019-05-10 NOTE — Op Note (Signed)
DATE OF SURGERY:05-10-19  PREOPERATIVE DIAGNOSIS:Left 2nd toe ulcer with cellulitis, abscess, osteomyelitis   POST OP DIAGNOSIS:Same  PROCEDURE PERFORMED:Left 2nd toe + distal metatarsal amputation  ANESTHESIA:MAC with local  Pre and Post op  1% lidocaine and 0.25% marcaine plain  INDICATIONS FOR PROCEDURE:  This 53 y.o. _0 @  patient seen in office on several occassions for worsening infection of left 2nd toe .  The patient has a  history of diabetes and ESRD with PAD with other digital amputations and BKA. The patient was given the option of amputation as a treatment for the cellulitis and osteomyelitis.The patient desires to surgery. The risks versus benefits of the procedure were discussed with the patient in detail by Dr. Cannon Kettle. The consent is available on the chart for review.  PROCEDURE IN DETAIL: After patient was taken to the operating room and placed on the operating table in the supine position, a safety strap was placed across the patient's waist.  Adequate IV sedation was administered by the Department of Anesthesia and a total of 10c were injected as a digital block to left 2nd toe.  A tourniquet was placed but not yet inflated. The foot was prepped and draped in the usual aseptic fashion lowering the operative Field.  Attention was directed to the Left 2nd toe where there was fibrotic tissue with exposed bone and tendon at left 2nd toe.  The tourniquet was inflated then a #10 blade was used to make an incision down the bone in a elliptical fashion encompassing the toe.  The incision was carried mediolaterally and plantarly encompassing the toe leaving a large amount of plantar skin intact.  Next, the toe was disarticulated at the metatarsophalangeal joint and removed using a #15 blade. The toe was sent for bone culture and sensitivity to pathology.  No purulent drainage or abscess was found.  The proximal margin of the surgical site tissue was denuded so the metatarsal head was  also resected and sent to pathology. After removal of all nonviable soft tissue and bone  there was no malodor.  Anaerobic and aerobic cultures were taken and passed this as a specimen to microbiology.  Next, copious amounts of gentamicin and impregnated saline were instilled into the wound. A #2-0 Vicryl was used to reapproximate the deep subcutaneous layer to release skin tension.  The flap was folded dorsally and  skin re-approximated using 3-0 and 0 prolene simple and mattress suture technique.  Iris scissors were used to modify and remodel the skin flap as needed.  An excellent cosmetic result was achieved.  Tourniquet was deflated. The patient tolerated the above anesthesia and surgery without apparent complications.  A standard postoperative dressing was applied consisting of betadine, adaptic, 4x4s, ABD, Kerlix, and ACE wrap.  The patient was transported via cart to [facility] with vital signs able and vascular status intact to remaining digits of the left foot.    Patient was discharged home with post op medications and instructions and is scheduled to follow up in office on next week for continued post op care.  Landis Martins, DPM

## 2019-05-11 ENCOUNTER — Encounter (HOSPITAL_COMMUNITY): Payer: Self-pay | Admitting: Sports Medicine

## 2019-05-11 NOTE — Anesthesia Postprocedure Evaluation (Signed)
Anesthesia Post Note  Patient: [name]  Procedure(s) Performed: AMPUTATION 2nd left TOE WITH METATARSAL (Left Foot)     Patient location during evaluation: PACU Anesthesia Type: MAC Level of consciousness: awake and alert Pain management: pain level controlled Vital Signs Assessment: post-procedure vital signs reviewed and stable Respiratory status: spontaneous breathing, nonlabored ventilation, respiratory function stable and patient connected to nasal cannula oxygen Cardiovascular status: stable and blood pressure returned to baseline Postop Assessment: no apparent nausea or vomiting Anesthetic complications: no    Last Vitals:  Vitals:   05/10/19 1613 05/10/19 1614  BP:  138/78  Pulse: 80   Resp: 20   Temp: 36.7 C   SpO2: 98%     Last Pain:  Vitals:   05/10/19 1613  TempSrc:   PainSc: 0-No pain                 Rhett Mutschler COKER

## 2019-05-13 ENCOUNTER — Telehealth: Payer: Self-pay | Admitting: *Deleted

## 2019-05-13 NOTE — Telephone Encounter (Signed)
Safe to continue benefits outweigh risks

## 2019-05-13 NOTE — Telephone Encounter (Signed)
I informed [facility] - Olivia Mackie of Dr. Leeanne Rio 05/13/2019 12:54pm statement.

## 2019-05-13 NOTE — Telephone Encounter (Signed)
I spoke with [facility] Olivia Mackie, and she states she is on the her way to pt's home. I informed Olivia Mackie of Dr. Leeanne Rio 05/10/2019 4:06pm orders. Faxed orders to [facility].

## 2019-05-13 NOTE — Telephone Encounter (Signed)
-----   Message from Landis Martins, [place] sent at 05/10/2019  4:06 PM EDT ----- Regarding: Home nursing  Wound care Betadine and dry dressing to surgical site 2x per week.

## 2019-05-13 NOTE — Telephone Encounter (Signed)
[facility] [name] states she checked pt's medication Levaquin with current medication and there is a interaction with pt's Seroquel.

## 2019-05-14 ENCOUNTER — Telehealth: Payer: Self-pay | Admitting: Sports Medicine

## 2019-05-14 NOTE — Telephone Encounter (Signed)
S/p Left 2nd toe + distal metatarsal amputation performed on 05-10-19 at [facility] by Dr. Cannon Kettle

## 2019-05-16 ENCOUNTER — Ambulatory Visit (INDEPENDENT_AMBULATORY_CARE_PROVIDER_SITE_OTHER): Payer: Medicare Other | Admitting: Sports Medicine

## 2019-05-16 ENCOUNTER — Ambulatory Visit (INDEPENDENT_AMBULATORY_CARE_PROVIDER_SITE_OTHER): Payer: Medicare Other

## 2019-05-16 ENCOUNTER — Other Ambulatory Visit: Payer: Self-pay | Admitting: Sports Medicine

## 2019-05-16 ENCOUNTER — Other Ambulatory Visit: Payer: Self-pay

## 2019-05-16 ENCOUNTER — Encounter: Payer: Self-pay | Admitting: Sports Medicine

## 2019-05-16 VITALS — BP 122/75 | HR 88 | Temp 97.9°F | Resp 16

## 2019-05-16 DIAGNOSIS — Z89422 Acquired absence of other left toe(s): Secondary | ICD-10-CM

## 2019-05-16 DIAGNOSIS — L03032 Cellulitis of left toe: Secondary | ICD-10-CM | POA: Diagnosis not present

## 2019-05-16 DIAGNOSIS — L02619 Cutaneous abscess of unspecified foot: Secondary | ICD-10-CM

## 2019-05-16 DIAGNOSIS — L03119 Cellulitis of unspecified part of limb: Secondary | ICD-10-CM

## 2019-05-16 DIAGNOSIS — M79672 Pain in left foot: Secondary | ICD-10-CM

## 2019-05-16 DIAGNOSIS — Z89511 Acquired absence of right leg below knee: Secondary | ICD-10-CM

## 2019-05-16 NOTE — Progress Notes (Signed)
Subjective: [name] is a 53 y.o. female patient seen today in office for POV #1 (DOS 05-10-19), S/P left second toe amputation.  Patient denies pain at surgical site besides off and on shooting 5 out of 10 sharp in nature, denies calf pain, denies headache, chest pain, shortness of breath, nausea, vomiting, fever, or chills. Patient states that the foot looks pretty messed up and has been draining some through the bandage.  Patient reports that she has home nurse and cannot recall how often they came this week to change the dressing states that she thinks that it has been once.  Patient currently taking her antibiotic without any issues and is on Percocet and has been elevating and getting her dressing change as above by home health.  No other issues noted.   Patient does not recall her last blood sugar last A1c or last visit to her primary care  Patient Active Problem List   Diagnosis Date Noted  . Genetic testing 03/12/2019  . Family history of colon cancer   . Family history of bladder cancer   . Family history of kidney cancer   . Postmenopausal bleeding 11/15/2018  . Dog bite of foot, sequela   . Left foot infection 10/25/2018  . Gangrenous disorder ([place]) 09/17/2018  . Cellulitis of left upper extremity   . Poorly controlled diabetes mellitus ([place])   . UTI (urinary tract infection) 05/23/2018  . Acute metabolic encephalopathy 76/19/5093  . GERD (gastroesophageal reflux disease) 05/09/2018  . Tobacco abuse 05/09/2018  . Polypharmacy 05/09/2018  . Restless leg syndrome 04/27/2018  . Anxiety 04/27/2018  . Hyponatremia 04/26/2018  . Noncompliance with dietary restriction 04/26/2018  . Postoperative cellulitis of surgical wound 04/26/2018  . At high risk for falls 04/24/2018  . Noncompliance with safety precautions 04/24/2018  . Leukocytosis 04/24/2018  . 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 PAD (peripheral artery disease) ([place])   . Poorly controlled type 2 diabetes  mellitus ([place])   . Subacute osteomyelitis, right ankle and foot ([place])   . Major depressive disorder, recurrent episode ([place]) 04/11/2018  . Diabetic ulcer of right midfoot associated with type 2 diabetes mellitus, with necrosis of bone ([place])   . Abscess of ankle   . Infectious synovitis   . Diabetic foot infection ([place])   . Sepsis ([place]) 04/09/2018  . Melanocytic nevus 02/28/2018  . Lesion of labia 09/27/2017  . Vaginitis and vulvovaginitis 02/19/2016  . Cancer of sigmoid colon ([place]) 03/09/2015  . History of stroke within last year 02/12/2015  . OSA (obstructive sleep apnea) 07/04/2014  . Migraine without aura, with intractable migraine, so stated, without mention of status migrainosus 03/26/2014  . Peripheral neuropathy 03/03/2014  . Abnormal stress test 02/12/2014  . CVA (cerebral infarction) 02/12/2014  . Chest pain   . Abnormal heart rhythm   . Edema   . Hypertension   . Hyperlipemia   . Hypercholesterolemia   . Type II diabetes mellitus, uncontrolled ([place]) 07/22/2013  . Diabetic neuropathy with neurologic complication ([place]) 26/71/2458  . Diabetic polyneuropathy ([place]) 07/22/2013    Current Outpatient Medications on File Prior to Visit  Medication Sig Dispense Refill  . amLODipine-benazepril (LOTREL) 5-10 MG capsule Take 1 capsule by mouth daily.     . Armodafinil 250 MG tablet Take 250 mg by mouth every morning.    [name] aspirin-acetaminophen-caffeine (EXCEDRIN MIGRAINE) 250-250-65 MG tablet Take 2 tablets by mouth every 6 (six) hours as needed for headache.    . cyclobenzaprine (FLEXERIL) 10  MG tablet     . diazepam (VALIUM) 5 MG tablet Take 5 mg by mouth 2 (two) times daily.     [name] docusate sodium (COLACE) 100 MG capsule Take 1 capsule (100 mg total) by mouth daily as needed. 30 capsule 2  . esomeprazole (NEXIUM) 40 MG capsule Take 40 mg by mouth daily.     [name] gabapentin (NEURONTIN) 600 MG tablet Take 1 tablet (600 mg total) by mouth 2 (two) times daily. Take in the morning and  afternoon. Do not take at night time. (Patient taking differently: Take 1,200 mg by mouth at bedtime. )    . glucose blood (FREESTYLE TEST STRIPS) test strip USE TO CHECK BLOOD SUGARS DAILY. DX CODE E11.65 50 each 3  . HETLIOZ 20 MG CAPS Take 20 mg by mouth at bedtime.    . INGREZZA 80 MG CAPS Take 80 mg by mouth at bedtime.    . insulin aspart (NOVOLOG FLEXPEN) 100 UNIT/ML FlexPen Inject 6 units with each meal daily and use sliding scale for further dosage 15 mL 3  . Insulin Glargine (BASAGLAR KWIKPEN) 100 UNIT/ML SOPN Inject 0.22 mLs (22 Units total) into the skin daily. (Patient taking differently: Inject 22 Units into the skin at bedtime. ) 15 mL 3  . JARDIANCE 10 MG TABS tablet TAKE 10 MG BY MOUTH DAILY WITH BREAKFAST. (Patient taking differently: Take 10 mg by mouth daily. ) 15 tablet 0  . levofloxacin (LEVAQUIN) 500 MG tablet Take 1 tablet (500 mg total) by mouth daily for 14 days. 14 tablet 0  . metFORMIN (GLUCOPHAGE-XR) 750 MG 24 hr tablet Take 750 mg by mouth 2 (two) times daily.    [name] oxyCODONE-acetaminophen (PERCOCET) 10-325 MG tablet Take 1 tablet by mouth every 6 (six) hours as needed for up to 7 days for pain. 28 tablet 0  . pantoprazole (PROTONIX) 40 MG tablet     . promethazine (PHENERGAN) 25 MG tablet Take 1 tablet (25 mg total) by mouth every 6 (six) hours as needed for nausea or vomiting. 30 tablet 0  . QUEtiapine (SEROQUEL) 300 MG tablet Take 600 mg by mouth at bedtime. One hour before bed  1  . VRAYLAR 6 MG CAPS Take 6 mg by mouth at bedtime.   1   No current facility-administered medications on file prior to visit.     Allergies  Allergen Reactions  . Latex Itching, Rash and Other (See Comments)    Pt states she cannot use condoms - cause an infection.  Use of latex on skin is okay for short period of time.  Tape causes rash  . Sweetening Enhancer [Flavoring Agent] Nausea And Vomiting and Other (See Comments)    HEADACHES  . Ropinirole     Pt stated, "I am getting  cramps in my legs - especially at night"  . Aspartame And Phenylalanine Nausea And Vomiting    HEADACHES  . Ibuprofen Other (See Comments)    HEADACHES  . Trazodone And Nefazodone Other (See Comments)    Hallucinations     Objective: There were no vitals filed for this visit.  General: No acute distress, AAOx3  Left foot.  Sutures intact with a small amount of gapping at the center aspect of the suture line with bruising, mild swelling to left foot, blanchable erythema, no warmth, no drainage that is active however it is likely that there is a small amount of blood coming from the gap at the suture line, no other signs of infection  noted, Capillary fill time <3 seconds in all remaining digits, gross sensation absent via light touch to left foot.  No pain at the calf.  Patient is status post left first and second toe digital amputations and status post right BKA.  Post Op Xray, left foot x-rays consistent with amputation status soft tissue margins within normal limits for postoperative status.  Assessment and Plan:  Problem List Items Addressed This Visit    None    Visit Diagnoses    S/P amputation of lesser toe, left (HCC)    -  Primary   Cellulitis and abscess of foot       Relevant Orders   DG Foot Complete Left   Left foot pain       Status post below knee amputation, right ([place])           -Patient seen and evaluated -X-rays reviewed -Applied Betadine and dry sterile dressing to surgical site left foot secured with Covan and padded dressing really well to avoid rubbing or irritation or bruising at suture line -Advised patient to make sure to keep dressings clean, dry, and intact to left foot allowing the home nurse to change as instructed -Advised patient to continue with post-op shoe on left foot and assistance with walker to avoid tripping or falling -Continue with rest and elevation to avoid swelling -Patient also expressed concerned about transportation to her follow-up  office visits I reached out to [employer] to see if their social worker can assist with arranging transportation as well as our office we look into getting her transportation however at this time we have no contacts or resources to ensure her transportation for her visits -Continue with Levaquin until completed -Continue with pain medication as needed patient reports that her primary care doctor will continue with filling her medications -Will plan for wound check and evaluation of sutures at next office visit. In the meantime, patient to call office if any issues or problems arise.   Landis Martins, DPM

## 2019-05-17 LAB — AEROBIC/ANAEROBIC CULTURE W GRAM STAIN (SURGICAL/DEEP WOUND): Gram Stain: NONE SEEN

## 2019-05-21 ENCOUNTER — Telehealth: Payer: Self-pay | Admitting: *Deleted
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
## 2019-05-21 NOTE — Telephone Encounter (Signed)
Oh no..I hope she's ok

## 2019-05-21 NOTE — Telephone Encounter (Signed)
[facility] - [name] states they are re-admitting pt to [facility] for 2 x week for 5 weeks.

## 2019-05-22 ENCOUNTER — Ambulatory Visit: Payer: Medicare Other | Admitting: Endocrinology

## 2019-05-23 ENCOUNTER — Encounter: Payer: Medicare Other | Admitting: Sports Medicine

## 2019-05-23 ENCOUNTER — Telehealth: Payer: Self-pay | Admitting: Endocrinology

## 2019-05-23 NOTE — Telephone Encounter (Signed)
Roselyn Reef with [facility] ph# [phone] requests to be called at the ph# listed above-trying to find out if we have a mutual patient to see if they can an order signed from August/or why hasn't Dr. Dwyane Dee signed the order that they have faxing to our office since August.

## 2019-05-23 NOTE — Telephone Encounter (Signed)
Called [name] back at the number listed below and informed her that back in late August of 2019, [facility] was contacted by myself and informed that Dr. Dwyane Dee would not be signing the orders because he is not the pt's primary care physician. [name] verbalized understanding of this and requested the name and number of her PCP. This was given to her, based off of the PCP listed in pt's chart.

## 2019-05-24 ENCOUNTER — Other Ambulatory Visit: Payer: Self-pay

## 2019-05-24 ENCOUNTER — Ambulatory Visit (INDEPENDENT_AMBULATORY_CARE_PROVIDER_SITE_OTHER): Payer: Medicare Other | Admitting: Sports Medicine

## 2019-05-24 ENCOUNTER — Encounter: Payer: Self-pay | Admitting: Sports Medicine

## 2019-05-24 VITALS — BP 119/75 | HR 93 | Temp 97.7°F | Resp 16

## 2019-05-24 DIAGNOSIS — L02619 Cutaneous abscess of unspecified foot: Secondary | ICD-10-CM

## 2019-05-24 DIAGNOSIS — L03119 Cellulitis of unspecified part of limb: Secondary | ICD-10-CM

## 2019-05-24 DIAGNOSIS — E1142 Type 2 diabetes mellitus with diabetic polyneuropathy: Secondary | ICD-10-CM

## 2019-05-24 DIAGNOSIS — M79672 Pain in left foot: Secondary | ICD-10-CM

## 2019-05-24 DIAGNOSIS — Z89422 Acquired absence of other left toe(s): Secondary | ICD-10-CM

## 2019-05-24 DIAGNOSIS — Z89511 Acquired absence of right leg below knee: Secondary | ICD-10-CM

## 2019-05-24 NOTE — Progress Notes (Signed)
Subjective: [name] is a 53 y.o. female patient seen today in office for POV #2 (DOS 05-10-19), S/P left second toe amputation.  Patient denies pain at surgical site and reports that she feels a lot better this week as compared to last week 5 out of 10 dull pain.  Patient currently taking her antibiotic without any issues and is on Percocet.  Patient has twice weekly wound care by home health.  No other issues noted.   Patient reports that her blood sugar on yesterday was 108.  Patient Active Problem List   Diagnosis Date Noted  . Genetic testing 03/12/2019  . Family history of colon cancer   . Family history of bladder cancer   . Family history of kidney cancer   . Postmenopausal bleeding 11/15/2018  . Dog bite of foot, sequela   . Left foot infection 10/25/2018  . Gangrenous disorder ([place]) 09/17/2018  . Cellulitis of left upper extremity   . Poorly controlled diabetes mellitus ([place])   . UTI (urinary tract infection) 05/23/2018  . Acute metabolic encephalopathy 12/28/7251  . GERD (gastroesophageal reflux disease) 05/09/2018  . Tobacco abuse 05/09/2018  . Polypharmacy 05/09/2018  . Restless leg syndrome 04/27/2018  . Anxiety 04/27/2018  . Hyponatremia 04/26/2018  . Noncompliance with dietary restriction 04/26/2018  . Postoperative cellulitis of surgical wound 04/26/2018  . At high risk for falls 04/24/2018  . Noncompliance with safety precautions 04/24/2018  . Leukocytosis 04/24/2018  . 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 PAD (peripheral artery disease) ([place])   . Poorly controlled type 2 diabetes mellitus ([place])   . Subacute osteomyelitis, right ankle and foot ([place])   . Major depressive disorder, recurrent episode ([place]) 04/11/2018  . Diabetic ulcer of right midfoot associated with type 2 diabetes mellitus, with necrosis of bone ([place])   . Abscess of ankle   . Infectious synovitis   . Diabetic foot infection ([place])   . Sepsis ([place]) 04/09/2018  . Melanocytic nevus  02/28/2018  . Lesion of labia 09/27/2017  . Vaginitis and vulvovaginitis 02/19/2016  . Cancer of sigmoid colon ([place]) 03/09/2015  . History of stroke within last year 02/12/2015  . OSA (obstructive sleep apnea) 07/04/2014  . Migraine without aura, with intractable migraine, so stated, without mention of status migrainosus 03/26/2014  . Peripheral neuropathy 03/03/2014  . Abnormal stress test 02/12/2014  . CVA (cerebral infarction) 02/12/2014  . Chest pain   . Abnormal heart rhythm   . Edema   . Hypertension   . Hyperlipemia   . Hypercholesterolemia   . Type II diabetes mellitus, uncontrolled ([place]) 07/22/2013  . Diabetic neuropathy with neurologic complication ([place]) 66/44/0347  . Diabetic polyneuropathy ([place]) 07/22/2013    Current Outpatient Medications on File Prior to Visit  Medication Sig Dispense Refill  . amLODipine-benazepril (LOTREL) 5-10 MG capsule Take 1 capsule by mouth daily.     . Armodafinil 250 MG tablet Take 250 mg by mouth every morning.    [name] aspirin-acetaminophen-caffeine (EXCEDRIN MIGRAINE) 250-250-65 MG tablet Take 2 tablets by mouth every 6 (six) hours as needed for headache.    . cyclobenzaprine (FLEXERIL) 10 MG tablet     . diazepam (VALIUM) 5 MG tablet Take 5 mg by mouth 2 (two) times daily.     [name] docusate sodium (COLACE) 100 MG capsule Take 1 capsule (100 mg total) by mouth daily as needed. 30 capsule 2  . esomeprazole (NEXIUM) 40 MG capsule Take 40 mg by mouth daily.     [name]  gabapentin (NEURONTIN) 600 MG tablet Take 1 tablet (600 mg total) by mouth 2 (two) times daily. Take in the morning and afternoon. Do not take at night time. (Patient taking differently: Take 1,200 mg by mouth at bedtime. )    . glucose blood (FREESTYLE TEST STRIPS) test strip USE TO CHECK BLOOD SUGARS DAILY. DX CODE E11.65 50 each 3  . HETLIOZ 20 MG CAPS Take 20 mg by mouth at bedtime.    . INGREZZA 80 MG CAPS Take 80 mg by mouth at bedtime.    . insulin aspart (NOVOLOG FLEXPEN) 100 UNIT/ML  FlexPen Inject 6 units with each meal daily and use sliding scale for further dosage 15 mL 3  . Insulin Glargine (BASAGLAR KWIKPEN) 100 UNIT/ML SOPN Inject 0.22 mLs (22 Units total) into the skin daily. (Patient taking differently: Inject 22 Units into the skin at bedtime. ) 15 mL 3  . JARDIANCE 10 MG TABS tablet TAKE 10 MG BY MOUTH DAILY WITH BREAKFAST. (Patient taking differently: Take 10 mg by mouth daily. ) 15 tablet 0  . levofloxacin (LEVAQUIN) 500 MG tablet Take 1 tablet (500 mg total) by mouth daily for 14 days. 14 tablet 0  . LINZESS 72 MCG capsule     . metFORMIN (GLUCOPHAGE-XR) 750 MG 24 hr tablet Take 750 mg by mouth 2 (two) times daily.    [name] oxyCODONE-acetaminophen (PERCOCET) 10-325 MG tablet     . pantoprazole (PROTONIX) 40 MG tablet     . promethazine (PHENERGAN) 25 MG tablet Take 1 tablet (25 mg total) by mouth every 6 (six) hours as needed for nausea or vomiting. 30 tablet 0  . QUEtiapine (SEROQUEL) 300 MG tablet Take 600 mg by mouth at bedtime. One hour before bed  1  . rOPINIRole (REQUIP) 2 MG tablet TAKE 2 TABLETS TWICE DAILY AND 2 AT BEDTIME    . VRAYLAR 6 MG CAPS Take 6 mg by mouth at bedtime.   1   No current facility-administered medications on file prior to visit.     Allergies  Allergen Reactions  . Latex Itching, Rash and Other (See Comments)    Pt states she cannot use condoms - cause an infection.  Use of latex on skin is okay for short period of time.  Tape causes rash  . Sweetening Enhancer [Flavoring Agent] Nausea And Vomiting and Other (See Comments)    HEADACHES  . Ropinirole     Pt stated, "I am getting cramps in my legs - especially at night"  . Aspartame And Phenylalanine Nausea And Vomiting    HEADACHES  . Ibuprofen Other (See Comments)    HEADACHES  . Trazodone And Nefazodone Other (See Comments)    Hallucinations     Objective: There were no vitals filed for this visit.  General: No acute distress, AAOx3  Left foot.  Sutures intact with a  small amount of gapping at the center aspect of the suture line with bruising that now reveals a partial thickness ulceration likely from bruised skin that has ruptured that measures less than 1 cm, mild swelling to left foot, blanchable erythema, no warmth, no drainage, no other signs of infection noted, Capillary fill time <3 seconds in all remaining digits, gross sensation absent via light touch to left foot.  No pain at the calf.  Patient is status post left first and second toe digital amputations and status post right BKA.  Assessment and Plan:  Problem List Items Addressed This Visit  Nervous and Auditory   Diabetic polyneuropathy (HCC)   Relevant Medications   rOPINIRole (REQUIP) 2 MG tablet    Other Visit Diagnoses    S/P amputation of lesser toe, left (HCC)    -  Primary   Cellulitis and abscess of foot       Left foot pain       Status post below knee amputation, right ([place])           -Patient seen and evaluated -Applied metahoney and dry sterile dressing to surgical site left foot secured with [place] and padded dressing really well to avoid rubbing or irritation or bruising at suture line -Advised patient to make sure to keep dressings clean, dry, and intact to left foot allowing the home nurse to change as instructed -Advised patient to continue with post-op shoe on left foot and assistance with walker to avoid tripping or falling -Home nurse will continue assisting patient with transportation -Continue with PRN medications -Will plan for wound check and possible removal sutures at next office visit. In the meantime, patient to call office if any issues or problems arise.   Landis Martins, DPM

## 2019-05-31 ENCOUNTER — Ambulatory Visit (INDEPENDENT_AMBULATORY_CARE_PROVIDER_SITE_OTHER): Payer: Medicare Other | Admitting: Sports Medicine

## 2019-05-31 ENCOUNTER — Encounter: Payer: Self-pay | Admitting: Sports Medicine

## 2019-05-31 ENCOUNTER — Other Ambulatory Visit: Payer: Self-pay

## 2019-05-31 DIAGNOSIS — Z89422 Acquired absence of other left toe(s): Secondary | ICD-10-CM

## 2019-05-31 DIAGNOSIS — L02619 Cutaneous abscess of unspecified foot: Secondary | ICD-10-CM

## 2019-05-31 DIAGNOSIS — Z89511 Acquired absence of right leg below knee: Secondary | ICD-10-CM

## 2019-05-31 DIAGNOSIS — L03119 Cellulitis of unspecified part of limb: Secondary | ICD-10-CM

## 2019-05-31 DIAGNOSIS — M79672 Pain in left foot: Secondary | ICD-10-CM

## 2019-05-31 DIAGNOSIS — E1142 Type 2 diabetes mellitus with diabetic polyneuropathy: Secondary | ICD-10-CM

## 2019-05-31 NOTE — Progress Notes (Signed)
Subjective: [name] is a 53 y.o. female patient seen today in office for POV #3 (DOS 05-10-19), S/P left second toe amputation.  Patient denies pain at surgical site and reports that she is doing good.  Patient currently taking her finished her antibiotic with no problems. Patient has twice weekly wound care by home health and reports that this is the last week her neighbor can bring her to her appointment.  No other issues noted.   Patient Active Problem List   Diagnosis Date Noted  . Genetic testing 03/12/2019  . Family history of colon cancer   . Family history of bladder cancer   . Family history of kidney cancer   . Postmenopausal bleeding 11/15/2018  . Dog bite of foot, sequela   . Left foot infection 10/25/2018  . Gangrenous disorder ([place]) 09/17/2018  . Cellulitis of left upper extremity   . Poorly controlled diabetes mellitus ([place])   . UTI (urinary tract infection) 05/23/2018  . Acute metabolic encephalopathy 21/19/4174  . GERD (gastroesophageal reflux disease) 05/09/2018  . Tobacco abuse 05/09/2018  . Polypharmacy 05/09/2018  . Restless leg syndrome 04/27/2018  . Anxiety 04/27/2018  . Hyponatremia 04/26/2018  . Noncompliance with dietary restriction 04/26/2018  . Postoperative cellulitis of surgical wound 04/26/2018  . At high risk for falls 04/24/2018  . Noncompliance with safety precautions 04/24/2018  . Leukocytosis 04/24/2018  . 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 PAD (peripheral artery disease) ([place])   . Poorly controlled type 2 diabetes mellitus ([place])   . Subacute osteomyelitis, right ankle and foot ([address])   . Major depressive disorder, recurrent episode ([place]) 04/11/2018  . Diabetic ulcer of right midfoot associated with type 2 diabetes mellitus, with necrosis of bone ([place])   . Abscess of ankle   . Infectious synovitis   . Diabetic foot infection ([place])   . Sepsis ([place]) 04/09/2018  . Melanocytic nevus 02/28/2018  . Lesion of labia 09/27/2017  .  Vaginitis and vulvovaginitis 02/19/2016  . Cancer of sigmoid colon ([place]) 03/09/2015  . History of stroke within last year 02/12/2015  . OSA (obstructive sleep apnea) 07/04/2014  . Migraine without aura, with intractable migraine, so stated, without mention of status migrainosus 03/26/2014  . Peripheral neuropathy 03/03/2014  . Abnormal stress test 02/12/2014  . CVA (cerebral infarction) 02/12/2014  . Chest pain   . Abnormal heart rhythm   . Edema   . Hypertension   . Hyperlipemia   . Hypercholesterolemia   . Type II diabetes mellitus, uncontrolled ([place]) 07/22/2013  . Diabetic neuropathy with neurologic complication ([place]) 08/08/4817  . Diabetic polyneuropathy ([place]) 07/22/2013    Current Outpatient Medications on File Prior to Visit  Medication Sig Dispense Refill  . amLODipine-benazepril (LOTREL) 5-10 MG capsule Take 1 capsule by mouth daily.     . Armodafinil 250 MG tablet Take 250 mg by mouth every morning.    [name] aspirin-acetaminophen-caffeine (EXCEDRIN MIGRAINE) 250-250-65 MG tablet Take 2 tablets by mouth every 6 (six) hours as needed for headache.    . cyclobenzaprine (FLEXERIL) 10 MG tablet     . diazepam (VALIUM) 5 MG tablet Take 5 mg by mouth 2 (two) times daily.     [name] docusate sodium (COLACE) 100 MG capsule Take 1 capsule (100 mg total) by mouth daily as needed. 30 capsule 2  . esomeprazole (NEXIUM) 40 MG capsule Take 40 mg by mouth daily.     [name] gabapentin (NEURONTIN) 600 MG tablet Take 1 tablet (600 mg total) by  mouth 2 (two) times daily. Take in the morning and afternoon. Do not take at night time. (Patient taking differently: Take 1,200 mg by mouth at bedtime. )    . glucose blood (FREESTYLE TEST STRIPS) test strip USE TO CHECK BLOOD SUGARS DAILY. DX CODE E11.65 50 each 3  . HETLIOZ 20 MG CAPS Take 20 mg by mouth at bedtime.    . INGREZZA 80 MG CAPS Take 80 mg by mouth at bedtime.    . insulin aspart (NOVOLOG FLEXPEN) 100 UNIT/ML FlexPen Inject 6 units with each meal daily  and use sliding scale for further dosage 15 mL 3  . Insulin Glargine (BASAGLAR KWIKPEN) 100 UNIT/ML SOPN Inject 0.22 mLs (22 Units total) into the skin daily. (Patient taking differently: Inject 22 Units into the skin at bedtime. ) 15 mL 3  . JARDIANCE 10 MG TABS tablet TAKE 10 MG BY MOUTH DAILY WITH BREAKFAST. (Patient taking differently: Take 10 mg by mouth daily. ) 15 tablet 0  . LINZESS 72 MCG capsule     . metFORMIN (GLUCOPHAGE-XR) 750 MG 24 hr tablet Take 750 mg by mouth 2 (two) times daily.    [name] oxyCODONE-acetaminophen (PERCOCET) 10-325 MG tablet     . pantoprazole (PROTONIX) 40 MG tablet     . promethazine (PHENERGAN) 25 MG tablet Take 1 tablet (25 mg total) by mouth every 6 (six) hours as needed for nausea or vomiting. 30 tablet 0  . QUEtiapine (SEROQUEL) 300 MG tablet Take 600 mg by mouth at bedtime. One hour before bed  1  . rOPINIRole (REQUIP) 2 MG tablet TAKE 2 TABLETS TWICE DAILY AND 2 AT BEDTIME    . VRAYLAR 6 MG CAPS Take 6 mg by mouth at bedtime.   1   No current facility-administered medications on file prior to visit.     Allergies  Allergen Reactions  . Latex Itching, Rash and Other (See Comments)    Pt states she cannot use condoms - cause an infection.  Use of latex on skin is okay for short period of time.  Tape causes rash  . Sweetening Enhancer [Flavoring Agent] Nausea And Vomiting and Other (See Comments)    HEADACHES  . Ropinirole     Pt stated, "I am getting cramps in my legs - especially at night"  . Aspartame And Phenylalanine Nausea And Vomiting    HEADACHES  . Ibuprofen Other (See Comments)    HEADACHES  . Trazodone And Nefazodone Other (See Comments)    Hallucinations     Objective: There were no vitals filed for this visit.  General: No acute distress, AAOx3  Left foot.  Sutures intact with a small amount of gapping at the center aspect of the suture line from blister/ruptured skin now with a partial thickness ulceration measures 1x2cm, mild  swelling to left foot, blanchable erythema, no warmth, no drainage, no other signs of infection noted, Capillary fill time <3 seconds in all remaining digits, gross sensation absent via light touch to left foot.  No pain at the calf.  Patient is status post left first and second toe digital amputations and status post right BKA.  Assessment and Plan:  Problem List Items Addressed This Visit      Nervous and Auditory   Diabetic polyneuropathy ([place])    Other Visit Diagnoses    S/P amputation of lesser toe, left (HCC)    -  Primary   Cellulitis and abscess of foot       Left foot  pain       Status post below knee amputation, right ([place])           -Patient seen and evaluated -A few sutures were removed -Applied medihoney and dry sterile dressing to surgical site left foot secured with Coban and padded dressing -Advised patient to make sure to keep dressings clean, dry, and intact to left foot allowing the home nurse to change as instructed using medihoney to the site -Advised patient to continue with post-op shoe on left foot and assistance with walker to avoid tripping or falling -SW at [facility] is working on getting transportation to her next appointment -Continue with PRN medications -Will plan for wound check and finishing removal sutures at next office visit. In the meantime, patient to call office if any issues or problems arise.   Landis Martins, DPM

## 2019-06-05 ENCOUNTER — Encounter: Payer: No Typology Code available for payment source | Admitting: Sports Medicine

## 2019-06-06 ENCOUNTER — Other Ambulatory Visit: Payer: Self-pay

## 2019-06-06 ENCOUNTER — Encounter: Payer: Self-pay | Admitting: Sports Medicine

## 2019-06-06 ENCOUNTER — Ambulatory Visit (INDEPENDENT_AMBULATORY_CARE_PROVIDER_SITE_OTHER): Payer: Medicare Other | Admitting: Sports Medicine

## 2019-06-06 VITALS — Temp 98.5°F | Resp 16

## 2019-06-06 DIAGNOSIS — Z89511 Acquired absence of right leg below knee: Secondary | ICD-10-CM

## 2019-06-06 DIAGNOSIS — Z89422 Acquired absence of other left toe(s): Secondary | ICD-10-CM

## 2019-06-06 DIAGNOSIS — L03119 Cellulitis of unspecified part of limb: Secondary | ICD-10-CM

## 2019-06-06 DIAGNOSIS — E1142 Type 2 diabetes mellitus with diabetic polyneuropathy: Secondary | ICD-10-CM

## 2019-06-06 DIAGNOSIS — L02619 Cutaneous abscess of unspecified foot: Secondary | ICD-10-CM

## 2019-06-06 DIAGNOSIS — M79672 Pain in left foot: Secondary | ICD-10-CM

## 2019-06-06 NOTE — Progress Notes (Signed)
Subjective: [name] is a 53 y.o. female patient seen today in office for POV #4 (DOS 05-10-19), S/P left second toe amputation.  Patient denies pain at surgical site and reports that she is doing good but did get the dressing wet this morning.  Patient reports that this is the last week her neighbor can bring her to her appointment.  Patient states that she is almost out of medihoney and is having a little of left knee discomfort because of crawling around on the floor.  No other issues noted.   Patient Active Problem List   Diagnosis Date Noted  . Genetic testing 03/12/2019  . Family history of colon cancer   . Family history of bladder cancer   . Family history of kidney cancer   . Postmenopausal bleeding 11/15/2018  . Dog bite of foot, sequela   . Left foot infection 10/25/2018  . Gangrenous disorder ([place]) 09/17/2018  . Cellulitis of left upper extremity   . Poorly controlled diabetes mellitus ([place])   . UTI (urinary tract infection) 05/23/2018  . Acute metabolic encephalopathy 42/68/3419  . GERD (gastroesophageal reflux disease) 05/09/2018  . Tobacco abuse 05/09/2018  . Polypharmacy 05/09/2018  . Restless leg syndrome 04/27/2018  . Anxiety 04/27/2018  . Hyponatremia 04/26/2018  . Noncompliance with dietary restriction 04/26/2018  . Postoperative cellulitis of surgical wound 04/26/2018  . At high risk for falls 04/24/2018  . Noncompliance with safety precautions 04/24/2018  . Leukocytosis 04/24/2018  . 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 PAD (peripheral artery disease) ([place])   . Poorly controlled type 2 diabetes mellitus ([place])   . Subacute osteomyelitis, right ankle and foot ([place])   . Major depressive disorder, recurrent episode ([place]) 04/11/2018  . Diabetic ulcer of right midfoot associated with type 2 diabetes mellitus, with necrosis of bone ([place])   . Abscess of ankle   . Infectious synovitis   . Diabetic foot infection ([place])   . Sepsis ([place]) 04/09/2018  .  Melanocytic nevus 02/28/2018  . Lesion of labia 09/27/2017  . Vaginitis and vulvovaginitis 02/19/2016  . Cancer of sigmoid colon ([place]) 03/09/2015  . History of stroke within last year 02/12/2015  . OSA (obstructive sleep apnea) 07/04/2014  . Migraine without aura, with intractable migraine, so stated, without mention of status migrainosus 03/26/2014  . Peripheral neuropathy 03/03/2014  . Abnormal stress test 02/12/2014  . CVA (cerebral infarction) 02/12/2014  . Chest pain   . Abnormal heart rhythm   . Edema   . Hypertension   . Hyperlipemia   . Hypercholesterolemia   . Type II diabetes mellitus, uncontrolled ([place]) 07/22/2013  . Diabetic neuropathy with neurologic complication ([place]) 62/22/9798  . Diabetic polyneuropathy ([place]) 07/22/2013    Current Outpatient Medications on File Prior to Visit  Medication Sig Dispense Refill  . amLODipine-benazepril (LOTREL) 5-10 MG capsule Take 1 capsule by mouth daily.     . Armodafinil 250 MG tablet Take 250 mg by mouth every morning.    [name] aspirin-acetaminophen-caffeine (EXCEDRIN MIGRAINE) 250-250-65 MG tablet Take 2 tablets by mouth every 6 (six) hours as needed for headache.    . cyclobenzaprine (FLEXERIL) 10 MG tablet     . diazepam (VALIUM) 5 MG tablet Take 5 mg by mouth 2 (two) times daily.     [name] docusate sodium (COLACE) 100 MG capsule Take 1 capsule (100 mg total) by mouth daily as needed. 30 capsule 2  . esomeprazole (NEXIUM) 40 MG capsule Take 40 mg by mouth daily.     [name]  gabapentin (NEURONTIN) 600 MG tablet Take 1 tablet (600 mg total) by mouth 2 (two) times daily. Take in the morning and afternoon. Do not take at night time. (Patient taking differently: Take 1,200 mg by mouth at bedtime. )    . glucose blood (FREESTYLE TEST STRIPS) test strip USE TO CHECK BLOOD SUGARS DAILY. DX CODE E11.65 50 each 3  . HETLIOZ 20 MG CAPS Take 20 mg by mouth at bedtime.    . INGREZZA 80 MG CAPS Take 80 mg by mouth at bedtime.    . insulin aspart (NOVOLOG  FLEXPEN) 100 UNIT/ML FlexPen Inject 6 units with each meal daily and use sliding scale for further dosage 15 mL 3  . Insulin Glargine (BASAGLAR KWIKPEN) 100 UNIT/ML SOPN Inject 0.22 mLs (22 Units total) into the skin daily. (Patient taking differently: Inject 22 Units into the skin at bedtime. ) 15 mL 3  . JARDIANCE 10 MG TABS tablet TAKE 10 MG BY MOUTH DAILY WITH BREAKFAST. (Patient taking differently: Take 10 mg by mouth daily. ) 15 tablet 0  . LINZESS 72 MCG capsule     . metFORMIN (GLUCOPHAGE-XR) 750 MG 24 hr tablet Take 750 mg by mouth 2 (two) times daily.    [name] oxyCODONE-acetaminophen (PERCOCET) 10-325 MG tablet     . pantoprazole (PROTONIX) 40 MG tablet     . promethazine (PHENERGAN) 25 MG tablet Take 1 tablet (25 mg total) by mouth every 6 (six) hours as needed for nausea or vomiting. 30 tablet 0  . QUEtiapine (SEROQUEL) 300 MG tablet Take 600 mg by mouth at bedtime. One hour before bed  1  . rOPINIRole (REQUIP) 2 MG tablet TAKE 2 TABLETS TWICE DAILY AND 2 AT BEDTIME    . VRAYLAR 6 MG CAPS Take 6 mg by mouth at bedtime.   1   No current facility-administered medications on file prior to visit.     Allergies  Allergen Reactions  . Latex Itching, Rash and Other (See Comments)    Pt states she cannot use condoms - cause an infection.  Use of latex on skin is okay for short period of time.  Tape causes rash  . Sweetening Enhancer [Flavoring Agent] Nausea And Vomiting and Other (See Comments)    HEADACHES  . Ropinirole     Pt stated, "I am getting cramps in my legs - especially at night"  . Aspartame And Phenylalanine Nausea And Vomiting    HEADACHES  . Ibuprofen Other (See Comments)    HEADACHES  . Trazodone And Nefazodone Other (See Comments)    Hallucinations     Objective: There were no vitals filed for this visit.  General: No acute distress, AAOx3  Left foot.  Sutures intact with a small amount of gapping at the center aspect of the suture line from blister/ruptured skin  now with a partial thickness ulceration measures 1x2cm same as previous with a granular base, mild swelling to left foot, blanchable erythema, no warmth, no drainage, no other signs of infection noted, Capillary fill time <3 seconds in all remaining digits, gross sensation absent via light touch to left foot.  No pain at the calf.  Patient is status post left first and second toe digital amputations and status post right BKA.  Assessment and Plan:  Problem List Items Addressed This Visit      Nervous and Auditory   Diabetic polyneuropathy ([place])    Other Visit Diagnoses    S/P amputation of lesser toe, left ([place])    -  Primary   Cellulitis and abscess of foot       Left foot pain       Status post below knee amputation, right ([place])           -Patient seen and evaluated -Remaining sutures removed -Applied medihoney and dry sterile dressing to surgical site left foot secured with Coban and padded dressing -Advised patient to make sure to keep dressings clean, dry, and intact to left foot allowing the home nurse to change as instructed using medihoney to the site -Advised patient to continue with post-op shoe on left foot and assistance with walker to avoid tripping or falling like before -SW via home health is working on getting transportation to her next appointment; emailed Tiffany regarding this  -Continue with PRN medications -Will plan for wound check at next office visit. In the meantime, patient to call office if any issues or problems arise.   Landis Martins, DPM

## 2019-06-07 ENCOUNTER — Other Ambulatory Visit: Payer: Self-pay | Admitting: Endocrinology

## 2019-06-10 ENCOUNTER — Other Ambulatory Visit: Payer: Self-pay | Admitting: Sports Medicine

## 2019-06-13 ENCOUNTER — Encounter: Payer: Medicare Other | Admitting: Sports Medicine

## 2019-06-21 ENCOUNTER — Telehealth: Payer: Self-pay

## 2019-06-21 NOTE — Telephone Encounter (Signed)
NOTES ON FILE FROM DR Tamsen Roers , [place] [phone], [place]

## 2019-06-24 ENCOUNTER — Telehealth: Payer: Self-pay

## 2019-06-24 NOTE — Telephone Encounter (Signed)
Eleanor from [facility] called to let us know that patient has been recerted for 60 days for wound care. Please call with any questions

## 2019-06-24 NOTE — Telephone Encounter (Signed)
Olivia Mackie from [facility] called to get orders for PT to help patient when walking with her prosthetic. Her number is [phone]

## 2019-06-24 NOTE — Telephone Encounter (Signed)
Great. Thank you.

## 2019-06-25 ENCOUNTER — Other Ambulatory Visit: Payer: Self-pay

## 2019-06-25 ENCOUNTER — Ambulatory Visit (INDEPENDENT_AMBULATORY_CARE_PROVIDER_SITE_OTHER): Payer: Medicare Other | Admitting: Sports Medicine

## 2019-06-25 ENCOUNTER — Encounter: Payer: Self-pay | Admitting: Sports Medicine

## 2019-06-25 VITALS — Temp 97.5°F

## 2019-06-25 DIAGNOSIS — Z89422 Acquired absence of other left toe(s): Secondary | ICD-10-CM

## 2019-06-25 DIAGNOSIS — L03119 Cellulitis of unspecified part of limb: Secondary | ICD-10-CM

## 2019-06-25 DIAGNOSIS — L02619 Cutaneous abscess of unspecified foot: Secondary | ICD-10-CM

## 2019-06-25 DIAGNOSIS — Z89511 Acquired absence of right leg below knee: Secondary | ICD-10-CM

## 2019-06-25 DIAGNOSIS — E1142 Type 2 diabetes mellitus with diabetic polyneuropathy: Secondary | ICD-10-CM

## 2019-06-25 DIAGNOSIS — M79672 Pain in left foot: Secondary | ICD-10-CM

## 2019-06-25 NOTE — Progress Notes (Signed)
Subjective: [name] is a 53 y.o. female patient seen today in office for POV #5 (DOS 05-10-19), S/P left second toe amputation.  Patient denies pain at surgical site and reports that she is doing okay.  Patient states that she is almost out of medihoney and is having a hard time dressing herself and wants to see if she can progress from the walker to a cane.  Patient came to office today by medical transport.  No other issues noted.   Patient Active Problem List   Diagnosis Date Noted  . Genetic testing 03/12/2019  . Family history of colon cancer   . Family history of bladder cancer   . Family history of kidney cancer   . Postmenopausal bleeding 11/15/2018  . Dog bite of foot, sequela   . Left foot infection 10/25/2018  . Gangrenous disorder ([place]) 09/17/2018  . Cellulitis of left upper extremity   . Poorly controlled diabetes mellitus ([place])   . UTI (urinary tract infection) 05/23/2018  . Acute metabolic encephalopathy 74/73/4037  . GERD (gastroesophageal reflux disease) 05/09/2018  . Tobacco abuse 05/09/2018  . Polypharmacy 05/09/2018  . Restless leg syndrome 04/27/2018  . Anxiety 04/27/2018  . Hyponatremia 04/26/2018  . Noncompliance with dietary restriction 04/26/2018  . Postoperative cellulitis of surgical wound 04/26/2018  . At high risk for falls 04/24/2018  . Noncompliance with safety precautions 04/24/2018  . Leukocytosis 04/24/2018  . 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 PAD (peripheral artery disease) ([place])   . Poorly controlled type 2 diabetes mellitus ([place])   . Subacute osteomyelitis, right ankle and foot ([place])   . Major depressive disorder, recurrent episode ([place]) 04/11/2018  . Diabetic ulcer of right midfoot associated with type 2 diabetes mellitus, with necrosis of bone ([place])   . Abscess of ankle   . Infectious synovitis   . Diabetic foot infection ([place])   . Sepsis ([place]) 04/09/2018  . Melanocytic nevus 02/28/2018  . Lesion of labia 09/27/2017  .  Vaginitis and vulvovaginitis 02/19/2016  . Cancer of sigmoid colon ([place]) 03/09/2015  . History of stroke within last year 02/12/2015  . OSA (obstructive sleep apnea) 07/04/2014  . Migraine without aura, with intractable migraine, so stated, without mention of status migrainosus 03/26/2014  . Peripheral neuropathy 03/03/2014  . Abnormal stress test 02/12/2014  . CVA (cerebral infarction) 02/12/2014  . Chest pain   . Abnormal heart rhythm   . Edema   . Hypertension   . Hyperlipemia   . Hypercholesterolemia   . Type II diabetes mellitus, uncontrolled ([place]) 07/22/2013  . Diabetic neuropathy with neurologic complication ([place]) 09/64/3838  . Diabetic polyneuropathy ([place]) 07/22/2013    Current Outpatient Medications on File Prior to Visit  Medication Sig Dispense Refill  . amLODipine-benazepril (LOTREL) 5-10 MG capsule Take 1 capsule by mouth daily.     . Armodafinil 250 MG tablet Take 250 mg by mouth every morning.    [name] aspirin-acetaminophen-caffeine (EXCEDRIN MIGRAINE) 250-250-65 MG tablet Take 2 tablets by mouth every 6 (six) hours as needed for headache.    . cyclobenzaprine (FLEXERIL) 10 MG tablet TAKE 1 TABLET BY MOUTH THREE TIMES A DAY AS NEEDED FOR MUSCLE SPASMS 30 tablet 0  . diazepam (VALIUM) 5 MG tablet Take 5 mg by mouth 2 (two) times daily.     [name] docusate sodium (COLACE) 100 MG capsule Take 1 capsule (100 mg total) by mouth daily as needed. 30 capsule 2  . empagliflozin (JARDIANCE) 10 MG TABS tablet Take 1 tablet  by mouth once daily at breakfast. 30 tablet 3  . esomeprazole (NEXIUM) 40 MG capsule Take 40 mg by mouth daily.     [name] gabapentin (NEURONTIN) 600 MG tablet Take 1 tablet (600 mg total) by mouth 2 (two) times daily. Take in the morning and afternoon. Do not take at night time. (Patient taking differently: Take 1,200 mg by mouth at bedtime. )    . glucose blood (FREESTYLE TEST STRIPS) test strip USE TO CHECK BLOOD SUGARS DAILY. DX CODE E11.65 50 each 3  . HETLIOZ 20 MG CAPS  Take 20 mg by mouth at bedtime.    . INGREZZA 80 MG CAPS Take 80 mg by mouth at bedtime.    . insulin aspart (NOVOLOG FLEXPEN) 100 UNIT/ML FlexPen Inject 6 units with each meal daily and use sliding scale for further dosage 15 mL 3  . Insulin Glargine (BASAGLAR KWIKPEN) 100 UNIT/ML SOPN Inject 0.22 mLs (22 Units total) into the skin daily. (Patient taking differently: Inject 22 Units into the skin at bedtime. ) 15 mL 3  . LINZESS 72 MCG capsule     . metFORMIN (GLUCOPHAGE-XR) 750 MG 24 hr tablet Take 750 mg by mouth 2 (two) times daily.    [name] oxyCODONE-acetaminophen (PERCOCET) 10-325 MG tablet     . pantoprazole (PROTONIX) 40 MG tablet     . promethazine (PHENERGAN) 25 MG tablet Take 1 tablet (25 mg total) by mouth every 6 (six) hours as needed for nausea or vomiting. 30 tablet 0  . QUEtiapine (SEROQUEL) 300 MG tablet Take 600 mg by mouth at bedtime. One hour before bed  1  . rOPINIRole (REQUIP) 2 MG tablet TAKE 2 TABLETS TWICE DAILY AND 2 AT BEDTIME    . VRAYLAR 6 MG CAPS Take 6 mg by mouth at bedtime.   1   No current facility-administered medications on file prior to visit.     Allergies  Allergen Reactions  . Latex Itching, Rash and Other (See Comments)    Pt states she cannot use condoms - cause an infection.  Use of latex on skin is okay for short period of time.  Tape causes rash  . Sweetening Enhancer [Flavoring Agent] Nausea And Vomiting and Other (See Comments)    HEADACHES  . Ropinirole     Pt stated, "I am getting cramps in my legs - especially at night"  . Aspartame And Phenylalanine Nausea And Vomiting    HEADACHES  . Ibuprofen Other (See Comments)    HEADACHES  . Trazodone And Nefazodone Other (See Comments)    Hallucinations     Objective: There were no vitals filed for this visit.  General: No acute distress, AAOx3  Left foot.  Incision site healing well at amputation with a partial thickness ulceration measures 0.3 x 0.2 cm with a granular base mild swelling to  left foot, blanchable erythema, no warmth, no drainage, no other signs of infection noted, Capillary fill time <3 seconds in all remaining digits, gross sensation absent via light touch to left foot.  No pain at the calf.  Patient is status post left first and second toe digital amputations and status post right BKA.  Assessment and Plan:  Problem List Items Addressed This Visit      Nervous and Auditory   Diabetic polyneuropathy ([place])    Other Visit Diagnoses    S/P amputation of lesser toe, left (HCC)    -  Primary   Cellulitis and abscess of foot  Left foot pain       Status post below knee amputation, right ([place])           -Patient seen and evaluated -Cleanse left foot -Applied medihoney and dry sterile dressing to surgical site left foot secured with Coban -Advised patient to make sure to keep dressings clean, dry, and intact to left foot allowing the home nurse to change as instructed using medihoney to the site twice weekly -Advised patient to continue with post-op shoe on left foot and assistance with walker to avoid tripping or falling like before we will order PT OT to help with gait and transitioning patient to cane as well as help her with activities of daily living like dressing herself -Continue with medical transportation to appointments -Will plan for wound check and having patient molded for insole and shoes with Rick at next office visit. In the meantime, patient to call office if any issues or problems arise.   Landis Martins, DPM

## 2019-06-26 ENCOUNTER — Telehealth: Payer: Self-pay

## 2019-06-26 NOTE — Telephone Encounter (Signed)
NOTES ON FILE 

## 2019-07-01 ENCOUNTER — Telehealth: Payer: Self-pay | Admitting: *Deleted

## 2019-07-01 NOTE — Telephone Encounter (Signed)
Yes patient may start PT with verbal orders approved! -Dr Cannon Kettle

## 2019-07-01 NOTE — Telephone Encounter (Signed)
[facility] - [place], PT request verbal orders for gait training with a cane 3x week for 3 weeks then 1 x week for 1 week.

## 2019-07-02 NOTE — Telephone Encounter (Signed)
Left message for [facility] PT to call for orders.

## 2019-07-03 ENCOUNTER — Other Ambulatory Visit: Payer: No Typology Code available for payment source | Admitting: Orthotics

## 2019-07-03 NOTE — Telephone Encounter (Signed)
Left message informing [facility] - [facility], OT Dr. Cannon Kettle had okayed the OT at the recommended schedule.

## 2019-07-03 NOTE — Telephone Encounter (Signed)
Left message for [name], PT - [facility] stating Dr. Marcene Duos PT at recommended schedule.

## 2019-07-03 NOTE — Telephone Encounter (Signed)
[facility] - [facility] Shaw, OT states pt has been evaluated and request verbal orders for OT 2x week for 2 weeks.

## 2019-07-09 ENCOUNTER — Other Ambulatory Visit: Payer: Self-pay

## 2019-07-09 ENCOUNTER — Ambulatory Visit: Payer: Medicare Other | Admitting: Orthotics

## 2019-07-09 ENCOUNTER — Encounter: Payer: Self-pay | Admitting: Sports Medicine

## 2019-07-09 ENCOUNTER — Ambulatory Visit (INDEPENDENT_AMBULATORY_CARE_PROVIDER_SITE_OTHER): Payer: Medicare Other | Admitting: Sports Medicine

## 2019-07-09 VITALS — Temp 98.2°F

## 2019-07-09 DIAGNOSIS — Z89422 Acquired absence of other left toe(s): Secondary | ICD-10-CM

## 2019-07-09 DIAGNOSIS — Z89511 Acquired absence of right leg below knee: Secondary | ICD-10-CM

## 2019-07-09 DIAGNOSIS — M79672 Pain in left foot: Secondary | ICD-10-CM

## 2019-07-09 DIAGNOSIS — E1142 Type 2 diabetes mellitus with diabetic polyneuropathy: Secondary | ICD-10-CM

## 2019-07-09 MED ORDER — LEVOFLOXACIN 500 MG PO TABS: 500.0000 mg | ORAL_TABLET | Freq: Every day | ORAL | 0 refills | Status: DC

## 2019-07-09 MED ORDER — FLUCONAZOLE 150 MG PO TABS: 150.0000 mg | ORAL_TABLET | Freq: Once | ORAL | 0 refills | Status: AC

## 2019-07-09 NOTE — Progress Notes (Signed)
Subjective: [name] is a 53 y.o. female patient seen today in office for POV #6 (DOS 05-10-19), S/P left second toe amputation.  Patient denies pain at surgical site and reports that she is doing good and physical therapy is working with her to walk with the cane in the house however they advised her to be very careful and not to walk with a cane when she is at home alone due to fear falling.  Patient denies any other pedal complaints however does admit that she has an issue with a sore spot at her bellybutton.  Patient denies nausea vomiting fever chills or any other constitutional symptoms at this time.  Patient came to office today by medical transport.  No other issues noted.   Patient Active Problem List   Diagnosis Date Noted  . Genetic testing 03/12/2019  . Family history of colon cancer   . Family history of bladder cancer   . Family history of kidney cancer   . Postmenopausal bleeding 11/15/2018  . Dog bite of foot, sequela   . Left foot infection 10/25/2018  . Gangrenous disorder ([place]) 09/17/2018  . Cellulitis of left upper extremity   . Poorly controlled diabetes mellitus ([place])   . UTI (urinary tract infection) 05/23/2018  . Acute metabolic encephalopathy 03/50/0938  . GERD (gastroesophageal reflux disease) 05/09/2018  . Tobacco abuse 05/09/2018  . Polypharmacy 05/09/2018  . Restless leg syndrome 04/27/2018  . Anxiety 04/27/2018  . Hyponatremia 04/26/2018  . Noncompliance with dietary restriction 04/26/2018  . Postoperative cellulitis of surgical wound 04/26/2018  . At high risk for falls 04/24/2018  . Noncompliance with safety precautions 04/24/2018  . Leukocytosis 04/24/2018  . 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 PAD (peripheral artery disease) ([place])   . Poorly controlled type 2 diabetes mellitus ([place])   . Subacute osteomyelitis, right ankle and foot ([place])   . Major depressive disorder, recurrent episode ([place]) 04/11/2018  . Diabetic ulcer of right midfoot  associated with type 2 diabetes mellitus, with necrosis of bone ([place])   . Abscess of ankle   . Infectious synovitis   . Diabetic foot infection ([place])   . Sepsis ([place]) 04/09/2018  . Melanocytic nevus 02/28/2018  . Lesion of labia 09/27/2017  . Vaginitis and vulvovaginitis 02/19/2016  . Cancer of sigmoid colon ([place]) 03/09/2015  . History of stroke within last year 02/12/2015  . OSA (obstructive sleep apnea) 07/04/2014  . Migraine without aura, with intractable migraine, so stated, without mention of status migrainosus 03/26/2014  . Peripheral neuropathy 03/03/2014  . Abnormal stress test 02/12/2014  . CVA (cerebral infarction) 02/12/2014  . Chest pain   . Abnormal heart rhythm   . Edema   . Hypertension   . Hyperlipemia   . Hypercholesterolemia   . Type II diabetes mellitus, uncontrolled ([place]) 07/22/2013  . Diabetic neuropathy with neurologic complication ([place]) 18/29/9371  . Diabetic polyneuropathy ([place]) 07/22/2013    Current Outpatient Medications on File Prior to Visit  Medication Sig Dispense Refill  . amLODipine-benazepril (LOTREL) 5-10 MG capsule Take 1 capsule by mouth daily.     . Armodafinil 250 MG tablet Take 250 mg by mouth every morning.    [name] aspirin-acetaminophen-caffeine (EXCEDRIN MIGRAINE) 250-250-65 MG tablet Take 2 tablets by mouth every 6 (six) hours as needed for headache.    . cyclobenzaprine (FLEXERIL) 10 MG tablet TAKE 1 TABLET BY MOUTH THREE TIMES A DAY AS NEEDED FOR MUSCLE SPASMS 30 tablet 0  . diazepam (VALIUM) 5 MG tablet  Take 5 mg by mouth 2 (two) times daily.     [name] docusate sodium (COLACE) 100 MG capsule Take 1 capsule (100 mg total) by mouth daily as needed. 30 capsule 2  . empagliflozin (JARDIANCE) 10 MG TABS tablet Take 1 tablet by mouth once daily at breakfast. 30 tablet 3  . esomeprazole (NEXIUM) 40 MG capsule Take 40 mg by mouth daily.     [name] gabapentin (NEURONTIN) 600 MG tablet Take 1 tablet (600 mg total) by mouth 2 (two) times daily. Take in the  morning and afternoon. Do not take at night time. (Patient taking differently: Take 1,200 mg by mouth at bedtime. )    . glucose blood (FREESTYLE TEST STRIPS) test strip USE TO CHECK BLOOD SUGARS DAILY. DX CODE E11.65 50 each 3  . HETLIOZ 20 MG CAPS Take 20 mg by mouth at bedtime.    . INGREZZA 80 MG CAPS Take 80 mg by mouth at bedtime.    . insulin aspart (NOVOLOG FLEXPEN) 100 UNIT/ML FlexPen Inject 6 units with each meal daily and use sliding scale for further dosage 15 mL 3  . Insulin Glargine (BASAGLAR KWIKPEN) 100 UNIT/ML SOPN Inject 0.22 mLs (22 Units total) into the skin daily. (Patient taking differently: Inject 22 Units into the skin at bedtime. ) 15 mL 3  . LINZESS 72 MCG capsule     . metFORMIN (GLUCOPHAGE-XR) 750 MG 24 hr tablet Take 750 mg by mouth 2 (two) times daily.    [name] oxyCODONE-acetaminophen (PERCOCET) 10-325 MG tablet     . pantoprazole (PROTONIX) 40 MG tablet     . promethazine (PHENERGAN) 25 MG tablet Take 1 tablet (25 mg total) by mouth every 6 (six) hours as needed for nausea or vomiting. 30 tablet 0  . QUEtiapine (SEROQUEL) 300 MG tablet Take 600 mg by mouth at bedtime. One hour before bed  1  . rOPINIRole (REQUIP) 2 MG tablet TAKE 2 TABLETS TWICE DAILY AND 2 AT BEDTIME    . tiZANidine (ZANAFLEX) 4 MG tablet TAKE 1 TABLET BY ORAL ROUTE EVERY 8 HOURS AS NEEDED NOT TO EXCEED 3 DOSES IN 24 HOURS AS NEEDED    . VRAYLAR 6 MG CAPS Take 6 mg by mouth at bedtime.   1   No current facility-administered medications on file prior to visit.     Allergies  Allergen Reactions  . Latex Itching, Rash and Other (See Comments)    Pt states she cannot use condoms - cause an infection.  Use of latex on skin is okay for short period of time.  Tape causes rash  . Sweetening Enhancer [Flavoring Agent] Nausea And Vomiting and Other (See Comments)    HEADACHES  . Ropinirole     Pt stated, "I am getting cramps in my legs - especially at night"  . Aspartame And Phenylalanine Nausea And  Vomiting    HEADACHES  . Ibuprofen Other (See Comments)    HEADACHES  . Trazodone And Nefazodone Other (See Comments)    Hallucinations     Objective: There were no vitals filed for this visit.  General: No acute distress, AAOx3  Left foot.  Incision site well-healed left foot, blanchable erythema, no warmth, no drainage, no other signs of infection noted, Capillary fill time <3 seconds in all remaining digits, gross sensation absent via light touch to left foot.  No pain at the calf.  Patient is status post left first and second toe digital amputations and status post right BKA.  Assessment  and Plan:  Problem List Items Addressed This Visit      Nervous and Auditory   Diabetic polyneuropathy ([place])   Relevant Medications   tiZANidine (ZANAFLEX) 4 MG tablet    Other Visit Diagnoses    S/P amputation of lesser toe, left (HCC)    -  Primary   Left foot pain       Status post below knee amputation, right ([place])           -Patient seen and evaluated -Left amputation site wound is well-healed.  Applied dry sterile dressing to the foot and advised nursing to continue with a protective dressing and [place] weekly until her custom shoes can arrive  -Advised patient to continue with post-op shoe on left foot and assistance with walker to avoid tripping or falling like before with PT OT and recommend life alert device -Patient met with Liliane Channel at today's visit and was molded for a custom shoe on the left -Continue with medical transportation to appointments -Will plan for wound check at next office visit.  In the meantime, patient to call office if any issues or problems arise.  -At no additional charge I did look at the irritation site at her bellybutton and advised her to discuss this with her primary care and to refrain from using peroxide to the area may use a little bit of Betadine and prescribed Levaquin for patient until she can get into see her PCP about this concerned that is out of my  scope of practice.  Landis Martins, DPM

## 2019-07-10 ENCOUNTER — Telehealth: Payer: Self-pay | Admitting: Cardiovascular Disease

## 2019-07-10 NOTE — Telephone Encounter (Signed)

## 2019-07-11 NOTE — Progress Notes (Signed)
Cardiology Office Note:    Date:  07/15/2019   ID:  [name], [name] May 03, 1966, MRN [id number]  PCP:  Tamsen Roers, MD  Cardiologist:    Darol Destine  Electrophysiologist:  None   Referring MD: Tamsen Roers, MD   Chief Complaint  Patient presents with  . Palpitations     Problem list 1.  Peripheral vascular disease -status post right BKA. 2.  Colon cancer -  3.  Diabetes mellitus - Dx in her 20s 4.  Chronic systolic congestive heart failure-ejection fraction 45 to 50%.  July 12, 2019   Francenia Chimenti [name] is a 53 y.o. female with a hx of severe diabetes mellitus, peripheral arterial disease.    We were asked to see her today for further evaluation of her palpitations by Dr. Tamsen Roers.   She previously is a patient of Dr. Candee Furbish.  She has a history of chronic systolic congestive heart failure.  Echo in April 2019 reveled an EF of 45-50%.  Grade 2 diastolic dysfunction   Heart catheterization in February, 2015 revealed normal coronary arteries.  Has heart pounding , has been present for several months. Can last 3-5 minutes She has a right BKA.   Has to crawl to her bedroom.  Her wheelchair and her walker do not fit in her bedroom so she has to go without these devices. The palpitations seem to occur when she is upright kneeling on her knees but not when she is crawling on all fours.  Occurs on most nights but not every night.  It is associated with some shortness of breath.  Does not occur when she is seated.  She uses a walker when she is out and about.  Does not seem to happen when she is walking with a walker.  Smokes 2 ppd     Past Medical History:  Diagnosis Date  . Anxiety   . Arthritis   . Bipolar disorder ([place])   . Broken jaw ([place])   . Broken wrist   . Chronic back pain   . Claudication ([place])    a. 12/2013 ABI's: R 0.97, L 0.94.  [name] Colon cancer ([place])   . COPD (chronic obstructive pulmonary disease) ([place])   . Depression   . Diabetic Charcot's  foot ([place])   . Diabetic foot ulcer (HCC)    chronic left foot ulcer , great toe/  hx recurrent foot ulcer bilaterally  . DJD (degenerative joint disease)   . Edema of both lower extremities   . Episode of memory loss   . Family history of bladder cancer   . Family history of colon cancer   . Family history of kidney cancer   . GERD (gastroesophageal reflux disease)   . Headache(784.0)   . Hiatal hernia   . History of colon cancer, stage I dx 02-26-2015--- oncologist-- dr [name]--- per last note no recurrence   05-16-2015 s/p  Laparoscopic sigmoid colectomy w/ node bx's (negative nodes per path)  Stage I (pT1,N0,M0) Grade 2  . 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   per MRI multiple small strokes post cardiac cath. --  residual mild memory loss  . History of methicillin resistant staphylococcus aureus (MRSA)   . Hypercholesterolemia   . Hypertension   . Insomnia   . Insulin dependent type 2 diabetes mellitus, uncontrolled ([place]) dx 2004   endocrinologist-  dr Dwyane Dee-- last A1c 8.8 in Aug2018:  pt is noncompliant w/ diet, states  does note eat breakfast , her first main meal in afternoon  . Neurogenic bladder   . Neuropathy, diabetic ([place])    hands and feet  . OSA (obstructive sleep apnea)    cpap intolerant  . Personality disorder ([place])   . Restless leg syndrome   . SOB (shortness of breath) on exertion   . Stroke ([place])   . SUI (stress urinary incontinence, female) S/P SLING 12-29-2011    Past Surgical History:  Procedure Laterality Date  . AMPUTATION Right 04/13/2018   Procedure: RIGHT BELOW KNEE AMPUTATION;  Surgeon: Newt Minion, MD;  Location: [facility];  Service: Orthopedics;  Laterality: Right;  . AMPUTATION Left 10/26/2018   Procedure: LEFT FOOT FIRST RAY AMPUTATION;  Surgeon: Newt Minion, MD;  Location: [facility];  Service: Orthopedics;  Laterality: Left;  . AMPUTATION TOE Left 05/10/2019   Procedure: AMPUTATION 2nd left TOE WITH METATARSAL;   Surgeon: Landis Martins, DPM;  Location: [facility];  Service: Podiatry;  Laterality: Left;  . ANTERIOR CERVICAL DECOMP/DISCECTOMY FUSION  2000   C5 - 7  . APPLICATION OF WOUND VAC Left 10/26/2018   Procedure: APPLICATION OF WOUND VAC;  Surgeon: Newt Minion, MD;  Location: [facility];  Service: Orthopedics;  Laterality: Left;  . BACK SURGERY    . CARDIAC CATHETERIZATION  05-22-2008   DR SKAINS   NO SIGNIFECANT CAD/ NORMAL LV/  EF 65%/  NO WALL MOTION ABNORMALITIES  . CARPAL TUNNEL RELEASE Right 04-25-2013  . CATARACT EXTRACTION Right 10/24/2018  . [place]; 1992  . COLON SURGERY    . COLONOSCOPY    . CYSTO N/A 04/30/2013   Procedure: CYSTOSCOPY;  Surgeon: Reece Packer, MD;  Location: [facility];  Service: Urology;  Laterality: N/A;  . CYSTOSCOPY MACROPLASTIQUE IMPLANT N/A 02/06/2018   Procedure: CYSTOSCOPY MACROPLASTIQUE IMPLANT;  Surgeon: Bjorn Loser, MD;  Location: [facility];  Service: Urology;  Laterality: N/A;  . CYSTOSCOPY WITH INJECTION  05/04/2012   Procedure: CYSTOSCOPY WITH INJECTION;  Surgeon: Reece Packer, MD;  Location: [facility];  Service: Urology;  Laterality: N/A;  MACROPLASTIQUE INJECTION  . CYSTOSCOPY WITH INJECTION  08/28/2012   Procedure: CYSTOSCOPY WITH INJECTION;  Surgeon: Reece Packer, MD;  [facility];  Service: Urology;  Laterality: N/A;  cysto and macroplastique   . FOOT SURGERY Bilateral    "related to [place]"  . HERNIA REPAIR  ?1996   "stomach"  . I&D EXTREMITY Left 10/25/2018   Procedure: IRRIGATION AND DEBRIDEMENT GREAT TOE;  Surgeon: Meredith Pel, MD;  Location: [facility];  Service: Orthopedics;  Laterality: Left;  . INCISION AND DRAINAGE Left 10/25/2018   GREAT TOE  . INCISION AND DRAINAGE OF WOUND Right 04/11/2018   Procedure: IRRIGATION AND DEBRIDEMENT WOUND right foot and right ankle;  Surgeon: Evelina Bucy, DPM;  Location: [facility];  Service: Podiatry;  Laterality:  Right;  . KNEE ARTHROSCOPY Left   . KNEE ARTHROSCOPY W/ ALLOGRAFT IMPANT Left    graft x 2  . KNEE SURGERY     TOTAL 8 SURG'S  . LAPAROSCOPIC SIGMOID COLECTOMY N/A 04/22/2015   Procedure: LAPAROSCOPIC HAND ASSISTED SIGMOID COLECTOMY;  Surgeon: Erroll Luna, MD;  Location: [facility];  Service: General;  Laterality: N/A;  . LEFT HEART CATHETERIZATION WITH CORONARY ANGIOGRAM N/A 02/12/2014   Procedure: LEFT HEART CATHETERIZATION WITH CORONARY ANGIOGRAM;  Surgeon: Candee Furbish, MD;  Location: [facility];  Service: Cardiovascular;  Laterality: N/A;  No angiographically significant CAD; normal LVSF, LVEDP 24mmHg,  EF 55% (new finding ef 30% myoview 01-08-2014)  . LUMBAR FUSION    . MANDIBLE FRACTURE SURGERY    . MULTIPLE LAPAROSCOPIES FOR ENDOMETRIOSIS    . PUBOVAGINAL SLING  12/29/2011   Procedure: Gaynelle Arabian;  Surgeon: Reece Packer, MD;  Location: [facility];  Service: Urology;  Laterality: N/A;  cysto and sparc sling   . PUBOVAGINAL SLING N/A 04/30/2013   Procedure: REMOVAL OF VAGINAL MESH;  Surgeon: Reece Packer, MD;  [facility];  Service: Urology;  Laterality: N/A;  . RECONSTURCTION OF CONGENITAL UTERUS ANOMALY  1983  . REPEAT RECONSTRUCTION ACL LEFT KNEE/ SCREWS REMOVED  03-28-2000   CADAVER GRAFT  . TOTAL ABDOMINAL HYSTERECTOMY  1997   w/BSO  . TRANSTHORACIC ECHOCARDIOGRAM  02/13/2014   ef 45%, hypokinesis base inferior and base inferolateral walls  . UPPER GI ENDOSCOPY    . WOUND DEBRIDEMENT Right 09/05/2016   Procedure: DEBRIDEMENT WOUND WITH GRAFT RIGHT FOOT;  Surgeon: Landis Martins, DPM;  Location: [facility];  Service: Podiatry;  Laterality: Right;  . WRIST FRACTURE SURGERY      Current Medications: Current Meds  Medication Sig  . aspirin-acetaminophen-caffeine (EXCEDRIN MIGRAINE) 250-250-65 MG tablet Take 2 tablets by mouth every 6 (six) hours as needed for headache.  . cyclobenzaprine (FLEXERIL) 10 MG tablet TAKE 1 TABLET BY MOUTH THREE TIMES A  DAY AS NEEDED FOR MUSCLE SPASMS  . diazepam (VALIUM) 5 MG tablet Take 5 mg by mouth 2 (two) times daily.   [name] docusate sodium (COLACE) 100 MG capsule Take 1 capsule (100 mg total) by mouth daily as needed.  . empagliflozin (JARDIANCE) 10 MG TABS tablet Take 1 tablet by mouth once daily at breakfast.  . esomeprazole (NEXIUM) 40 MG capsule Take 40 mg by mouth daily.   [name] gabapentin (NEURONTIN) 600 MG tablet Take 600 mg by mouth 3 (three) times daily.  [name] glucose blood (FREESTYLE TEST STRIPS) test strip USE TO CHECK BLOOD SUGARS DAILY. DX CODE E11.65  . INGREZZA 80 MG CAPS Take 80 mg by mouth at bedtime.  . insulin aspart (NOVOLOG FLEXPEN) 100 UNIT/ML FlexPen Inject 6 units with each meal daily and use sliding scale for further dosage  . Insulin Glargine (BASAGLAR KWIKPEN) 100 UNIT/ML SOPN Inject 0.22 mLs (22 Units total) into the skin daily.  [name] levofloxacin (LEVAQUIN) 500 MG tablet Take 1 tablet (500 mg total) by mouth daily.  [name] LINZESS 72 MCG capsule   . metFORMIN (GLUCOPHAGE-XR) 750 MG 24 hr tablet Take 750 mg by mouth 2 (two) times daily.  [name] oxyCODONE-acetaminophen (PERCOCET) 10-325 MG tablet   . pantoprazole (PROTONIX) 40 MG tablet   . promethazine (PHENERGAN) 25 MG tablet Take 1 tablet (25 mg total) by mouth every 6 (six) hours as needed for nausea or vomiting.  [name] QUEtiapine (SEROQUEL) 300 MG tablet Take 600 mg by mouth at bedtime. One hour before bed  . tiZANidine (ZANAFLEX) 4 MG tablet TAKE 1 TABLET BY ORAL ROUTE EVERY 8 HOURS AS NEEDED NOT TO EXCEED 3 DOSES IN 24 HOURS AS NEEDED  . VRAYLAR 6 MG CAPS Take 6 mg by mouth at bedtime.   . [DISCONTINUED] amLODipine-benazepril (LOTREL) 5-10 MG capsule Take 1 capsule by mouth daily.      Allergies:   Latex, Sweetening enhancer [flavoring agent], Ropinirole, Aspartame and phenylalanine, Ibuprofen, and Trazodone and nefazodone   Social History   Socioeconomic History  . Marital status: Married  Spouse name: Not on file  . Number of children:  2  . Years of education: 6th  . Highest education level: Not on file  Occupational History  . Not on file  Social Needs  . Financial resource strain: Not on file  . Food insecurity    Worry: Not on file    Inability: Not on file  . Transportation needs    Medical: Not on file    Non-medical: Not on file  Tobacco Use  . Smoking status: Current Every Day Smoker    Packs/day: 2.00    Years: 46.00    Pack years: 92.00    Types: Cigarettes  . Smokeless tobacco: Never Used  . Tobacco comment: per pt started smoking  age 77  Substance and Sexual Activity  . Alcohol use: Yes    Comment:  "special occasions only"  . Drug use: Not Currently  . Sexual activity: Yes  Lifestyle  . Physical activity    Days per week: Not on file    Minutes per session: Not on file  . Stress: Not on file  Relationships  . Social Herbalist on phone: Not on file    Gets together: Not on file    Attends religious service: Not on file    Active member of club or organization: Not on file    Attends meetings of clubs or organizations: Not on file    Relationship status: Not on file  Other Topics Concern  . Not on file  Social History Narrative   Patient is divorced and lives alone- independent in ADLs, Drives   Patient has two adult children- grown son and daughter who is special needs in a home   Patient is disabled since 54   Patient has a 6th grade education.   Patient is right-handed.   Depression-medication and therapist   Patient drinks 2- 3 liters of soda and drinks tea but not everyday.    Does not routinely exercise.      Patient reports that she drinks "a lot" of caffeine daily      Family History: The patient's family history includes Bladder Cancer in her paternal aunt; Cancer - Other in her brother, father, and mother; Diabetes in her mother; Heart attack in her father; Hypertension in her mother; Other in her cousin, father, and paternal uncle.  ROS:   Please see the  history of present illness.     All other systems reviewed and are negative.  EKGs/Labs/Other Studies Reviewed:    The following studies were reviewed today:   EKG:   Recent Labs: 02/25/2019: ALT <6 05/10/2019: BUN <5; Creatinine, Ser 0.53; Hemoglobin 11.4; Platelets 435; Potassium 3.7; Sodium 136  Recent Lipid Panel    Component Value Date/Time   CHOL 76 04/12/2018 0553   TRIG 122 04/12/2018 0553   HDL 15 (L) 04/12/2018 0553   CHOLHDL 5.1 04/12/2018 0553   VLDL 24 04/12/2018 0553   LDLCALC 37 04/12/2018 0553   LDLDIRECT 72.0 03/19/2018 1559    Physical Exam:    VS:  BP 112/68   Pulse 89   Ht 5\' 5"  (1.651 m)   Wt 166 lb (75.3 kg)   SpO2 97%   BMI 27.62 kg/m     Wt Readings from Last 3 Encounters:  07/12/19 166 lb (75.3 kg)  05/10/19 177 lb (80.3 kg)  03/13/19 173 lb (78.5 kg)     GEN:  Middle age female,  Appears chronicaly ill  HEENT: Normal NECK: No JVD; No carotid bruits LYMPHATICS: No lymphadenopathy CARDIAC:  RR  RESPIRATORY:  Clear to auscultation without rales, wheezing or rhonchi  ABDOMEN: Soft, non-tender, non-distended MUSCULOSKELETAL:  No edema; No deformity  SKIN: Warm and dry NEUROLOGIC:  Alert and oriented x 3 PSYCHIATRIC:  Normal affect   ASSESSMENT:    1. Hypertension, unspecified type   2. Cardiac arrhythmia, unspecified cardiac arrhythmia type   3. Hyperlipidemia, unspecified hyperlipidemia type   4. Chronic systolic heart failure (HCC)   5. Palpitations    PLAN:    In order of problems listed above:  1. Palpitations: She complains of having palpitations when she is crawling to the bed in her room.  Apparently her wheelchair and her walker do not fit into her bedroom so she is forced to crawl.  She typically has palpitations when she is doing this.  She has known chronic systolic congestive heart failure but is not on a beta-blocker.  We will start her on bisoprolol 2.5 mg a day.  This will help with her palpitations.  2.  Chronic  systolic congestive heart failure.  She is currently on Lotrel which is a combination of amlodipine plus benazepril.  Amlodipine is typically a medication that we try to avoid in the setting of chronic systolic congestive heart failure.  We will discontinue the Lotrel and start her on losartan 50 mg once a day.  She will also be starting on bisoprolol.  If she needs additional blood pressure lowering we can consider starting her on spironolactone.  3.  Peripheral vascular disease: she  used to smoke 4 packs of cigarettes a day.  She is cut down to 2 packs of cigarettes.  I strongly encouraged her to greatly reduce her smoking and to eventually stop.  We discussed the fact that this smoking was a major cause of her peripheral vascular disease     Medication Adjustments/Labs and Tests Ordered: Current medicines are reviewed at length with the patient today.  Concerns regarding medicines are outlined above.  Orders Placed This Encounter  Procedures  . Basic metabolic panel   Meds ordered this encounter  Medications  . bisoprolol (ZEBETA) 5 MG tablet    Sig: Take 0.5 tablets (2.5 mg total) by mouth daily.    Dispense:  45 tablet    Refill:  3  . losartan (COZAAR) 25 MG tablet    Sig: Take 1 tablet (25 mg total) by mouth daily.    Dispense:  90 tablet    Refill:  3     Patient Instructions  Medication Instructions:  Your physician has recommended you make the following change in your medication:   Stop Lotrel Begin Losartan, 25mg  tablet, once daily Begin Bisoprolol, 2.5mg  half tablet, once daily   Labwork: Your physician recommends that you return for lab work in:   3 weeks for a BMP  Testing/Procedures: None ordered.  Follow-Up: Your physician recommends that you schedule a follow-up appointment in:   3 months with an APP on Dr. Elmarie Shiley team-  Richardson Dopp, PA [place], [place] Pecolia Ades, NP  Any Other Special Instructions Will Be Listed Below (If Applicable).      If you need a refill on your cardiac medications before your next appointment, please call your pharmacy.      Signed, Mertie Moores, MD  07/15/2019 11:29 AM    [facility]

## 2019-07-12 ENCOUNTER — Other Ambulatory Visit: Payer: Self-pay

## 2019-07-12 ENCOUNTER — Telehealth: Payer: Self-pay | Admitting: *Deleted

## 2019-07-12 ENCOUNTER — Encounter: Payer: Self-pay | Admitting: Cardiovascular Disease

## 2019-07-12 ENCOUNTER — Ambulatory Visit (INDEPENDENT_AMBULATORY_CARE_PROVIDER_SITE_OTHER): Payer: Medicare Other | Admitting: Cardiovascular Disease

## 2019-07-12 VITALS — BP 112/68 | HR 89 | Ht 65.0 in | Wt 166.0 lb

## 2019-07-12 DIAGNOSIS — I10 Essential (primary) hypertension: Secondary | ICD-10-CM

## 2019-07-12 DIAGNOSIS — R002 Palpitations: Secondary | ICD-10-CM

## 2019-07-12 DIAGNOSIS — I5022 Chronic systolic (congestive) heart failure: Secondary | ICD-10-CM

## 2019-07-12 DIAGNOSIS — E785 Hyperlipidemia, unspecified: Secondary | ICD-10-CM | POA: Diagnosis not present

## 2019-07-12 DIAGNOSIS — I499 Cardiac arrhythmia, unspecified: Secondary | ICD-10-CM

## 2019-07-12 MED ORDER — LOSARTAN POTASSIUM 25 MG PO TABS: 25.0000 mg | ORAL_TABLET | Freq: Every day | ORAL | 3 refills | Status: DC

## 2019-07-12 MED ORDER — BISOPROLOL FUMARATE 5 MG PO TABS: 2.5000 mg | ORAL_TABLET | Freq: Every day | ORAL | 3 refills | Status: DC

## 2019-07-12 NOTE — Patient Instructions (Addendum)
Medication Instructions:  Your physician has recommended you make the following change in your medication:   Stop Lotrel Begin Losartan, 25mg  tablet, once daily Begin Bisoprolol, 2.5mg  half tablet, once daily   Labwork: Your physician recommends that you return for lab work in:   3 weeks for a BMP  Testing/Procedures: None ordered.  Follow-Up: Your physician recommends that you schedule a follow-up appointment in:   3 months with an APP on Dr. Elmarie Shiley team-  Richardson Dopp, PA [place], [place] Pecolia Ades, NP  Any Other Special Instructions Will Be Listed Below (If Applicable).     If you need a refill on your cardiac medications before your next appointment, please call your pharmacy.

## 2019-07-12 NOTE — Telephone Encounter (Signed)
Noted Benefits outweigh the risks

## 2019-07-12 NOTE — Telephone Encounter (Signed)
[facility] - [name] states she was reviewing pt's new medication and pt's new medication levaquin has a potential for reaction to pt's seroquel.

## 2019-07-15 ENCOUNTER — Encounter: Payer: Self-pay | Admitting: Cardiovascular Disease

## 2019-07-22 ENCOUNTER — Telehealth: Payer: Self-pay | Admitting: *Deleted

## 2019-07-22 NOTE — Telephone Encounter (Signed)
[facility] request verbal orders to continue PT for cane use and balance.

## 2019-07-22 NOTE — Telephone Encounter (Signed)
Left message informing [facility] Handler that Dr. Marcene Duos the continuation of PT as recommended.

## 2019-07-23 ENCOUNTER — Ambulatory Visit (INDEPENDENT_AMBULATORY_CARE_PROVIDER_SITE_OTHER): Payer: Self-pay | Admitting: Sports Medicine

## 2019-07-23 ENCOUNTER — Encounter: Payer: Self-pay | Admitting: Sports Medicine

## 2019-07-23 ENCOUNTER — Telehealth: Payer: Self-pay | Admitting: *Deleted

## 2019-07-23 ENCOUNTER — Other Ambulatory Visit: Payer: Self-pay

## 2019-07-23 VITALS — Temp 97.2°F

## 2019-07-23 DIAGNOSIS — M79672 Pain in left foot: Secondary | ICD-10-CM

## 2019-07-23 DIAGNOSIS — Z89422 Acquired absence of other left toe(s): Secondary | ICD-10-CM

## 2019-07-23 DIAGNOSIS — Z89511 Acquired absence of right leg below knee: Secondary | ICD-10-CM

## 2019-07-23 DIAGNOSIS — E1142 Type 2 diabetes mellitus with diabetic polyneuropathy: Secondary | ICD-10-CM

## 2019-07-23 MED ORDER — TIZANIDINE HCL 4 MG PO TABS: ORAL_TABLET | ORAL | 0 refills | Status: DC

## 2019-07-23 NOTE — Telephone Encounter (Signed)
[facility] - [name] states pt missed a [place] visit due to a doctor appt.

## 2019-07-23 NOTE — Progress Notes (Signed)
Subjective: [name] is a 53 y.o. female patient seen today in office for POV # 7 (DOS 05-10-19), S/P left second toe amputation.  Patient denies pain at surgical site and reports that she is doing good. Needs a refill on her medication.  Patient denies nausea vomiting fever chills or any other constitutional symptoms at this time.  Patient came to office today by medical transport.  No other issues noted.   Patient Active Problem List   Diagnosis Date Noted  . Genetic testing 03/12/2019  . Family history of colon cancer   . Family history of bladder cancer   . Family history of kidney cancer   . Postmenopausal bleeding 11/15/2018  . Dog bite of foot, sequela   . Left foot infection 10/25/2018  . Gangrenous disorder ([place]) 09/17/2018  . Cellulitis of left upper extremity   . Poorly controlled diabetes mellitus ([place])   . UTI (urinary tract infection) 05/23/2018  . Acute metabolic encephalopathy 66/29/4765  . GERD (gastroesophageal reflux disease) 05/09/2018  . Tobacco abuse 05/09/2018  . Polypharmacy 05/09/2018  . Restless leg syndrome 04/27/2018  . Anxiety 04/27/2018  . Hyponatremia 04/26/2018  . Noncompliance with dietary restriction 04/26/2018  . Postoperative cellulitis of surgical wound 04/26/2018  . At high risk for falls 04/24/2018  . Noncompliance with safety precautions 04/24/2018  . Leukocytosis 04/24/2018  . 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 PAD (peripheral artery disease) ([place])   . Poorly controlled type 2 diabetes mellitus ([place])   . Subacute osteomyelitis, right ankle and foot ([place])   . Major depressive disorder, recurrent episode ([place]) 04/11/2018  . Diabetic ulcer of right midfoot associated with type 2 diabetes mellitus, with necrosis of bone ([place])   . Abscess of ankle   . Infectious synovitis   . Diabetic foot infection ([place])   . Sepsis ([place]) 04/09/2018  . Melanocytic nevus 02/28/2018  . Lesion of labia 09/27/2017  . Vaginitis and vulvovaginitis  02/19/2016  . Cancer of sigmoid colon ([place]) 03/09/2015  . History of stroke within last year 02/12/2015  . OSA (obstructive sleep apnea) 07/04/2014  . Migraine without aura, with intractable migraine, so stated, without mention of status migrainosus 03/26/2014  . Peripheral neuropathy 03/03/2014  . Abnormal stress test 02/12/2014  . CVA (cerebral infarction) 02/12/2014  . Chest pain   . Abnormal heart rhythm   . Edema   . Hypertension   . Hyperlipemia   . Hypercholesterolemia   . Type II diabetes mellitus, uncontrolled ([place]) 07/22/2013  . Diabetic neuropathy with neurologic complication ([place]) 46/50/3546  . Diabetic polyneuropathy ([place]) 07/22/2013    Current Outpatient Medications on File Prior to Visit  Medication Sig Dispense Refill  . amoxicillin (AMOXIL) 500 MG capsule TAKE 1 CAPSULE BY MOUTH THREE TIMES A DAY    . aspirin-acetaminophen-caffeine (EXCEDRIN MIGRAINE) 250-250-65 MG tablet Take 2 tablets by mouth every 6 (six) hours as needed for headache.    . bisoprolol (ZEBETA) 5 MG tablet Take 0.5 tablets (2.5 mg total) by mouth daily. 45 tablet 3  . cyclobenzaprine (FLEXERIL) 10 MG tablet TAKE 1 TABLET BY MOUTH THREE TIMES A DAY AS NEEDED FOR MUSCLE SPASMS 30 tablet 0  . diazepam (VALIUM) 5 MG tablet Take 5 mg by mouth 2 (two) times daily.     [name] docusate sodium (COLACE) 100 MG capsule Take 1 capsule (100 mg total) by mouth daily as needed. 30 capsule 2  . empagliflozin (JARDIANCE) 10 MG TABS tablet Take 1 tablet by mouth once daily  at breakfast. 30 tablet 3  . esomeprazole (NEXIUM) 40 MG capsule Take 40 mg by mouth daily.     . fenofibrate (TRICOR) 48 MG tablet Take 48 mg by mouth daily.    [name] gabapentin (NEURONTIN) 600 MG tablet Take 600 mg by mouth 3 (three) times daily.    [name] glucose blood (FREESTYLE TEST STRIPS) test strip USE TO CHECK BLOOD SUGARS DAILY. DX CODE E11.65 50 each 3  . INGREZZA 80 MG CAPS Take 80 mg by mouth at bedtime.    . insulin aspart (NOVOLOG FLEXPEN) 100  UNIT/ML FlexPen Inject 6 units with each meal daily and use sliding scale for further dosage 15 mL 3  . Insulin Glargine (BASAGLAR KWIKPEN) 100 UNIT/ML SOPN Inject 0.22 mLs (22 Units total) into the skin daily. 15 mL 3  . levofloxacin (LEVAQUIN) 500 MG tablet Take 1 tablet (500 mg total) by mouth daily. 7 tablet 0  . LINZESS 72 MCG capsule     . losartan (COZAAR) 25 MG tablet Take 1 tablet (25 mg total) by mouth daily. 90 tablet 3  . metFORMIN (GLUCOPHAGE-XR) 750 MG 24 hr tablet Take 750 mg by mouth 2 (two) times daily.    [name] oxyCODONE-acetaminophen (PERCOCET) 10-325 MG tablet     . pantoprazole (PROTONIX) 40 MG tablet     . promethazine (PHENERGAN) 25 MG tablet Take 1 tablet (25 mg total) by mouth every 6 (six) hours as needed for nausea or vomiting. 30 tablet 0  . QUEtiapine (SEROQUEL) 300 MG tablet Take 600 mg by mouth at bedtime. One hour before bed  1  . VRAYLAR 6 MG CAPS Take 6 mg by mouth at bedtime.   1   No current facility-administered medications on file prior to visit.     Allergies  Allergen Reactions  . Latex Itching, Rash and Other (See Comments)    Pt states she cannot use condoms - cause an infection.  Use of latex on skin is okay for short period of time.  Tape causes rash  . Sweetening Enhancer [Flavoring Agent] Nausea And Vomiting and Other (See Comments)    HEADACHES  . Ropinirole     Pt stated, "I am getting cramps in my legs - especially at night"  . Aspartame And Phenylalanine Nausea And Vomiting    HEADACHES  . Ibuprofen Other (See Comments)    HEADACHES  . Trazodone And Nefazodone Other (See Comments)    Hallucinations     Objective: There were no vitals filed for this visit.  General: No acute distress, AAOx3  Left foot.  Incision site well-healed left foot, no erythema, no warmth, no drainage, no other signs of infection noted, Capillary fill time <3 seconds in all remaining digits, gross sensation absent via light touch to left foot.  No pain at the  calf.  Patient is status post left first and second toe digital amputations and status post right BKA.  Assessment and Plan:  Problem List Items Addressed This Visit      Nervous and Auditory   Diabetic polyneuropathy ([place])   Relevant Medications   tiZANidine (ZANAFLEX) 4 MG tablet    Other Visit Diagnoses    S/P amputation of lesser toe, left ([facility])    -  Primary   Left foot pain       Status post below knee amputation, right ([place])           -Patient seen and evaluated -Left amputation site wound is well-healed.  Applied moisturizing cream  and dry sterile dressing to the foot and advised nursing to continue with a protective dressing and Coban weekly until her custom shoes can arrive which will likely be in 2-3 weeks -Advised patient to continue with post-op shoe on left foot and assistance with walker to avoid tripping or falling like before with PT OT and recommend life alert device of which patient is awaiting to get -Patient awaiting custom shoes -Continue with medical transportation to appointments -Will plan for wound check and dispensing of shoes at next office visit.  In the meantime, patient to call office if any issues or problems arise.  Landis Martins, DPM

## 2019-07-24 ENCOUNTER — Telehealth: Payer: Self-pay | Admitting: *Deleted

## 2019-07-24 NOTE — Telephone Encounter (Signed)
I called [facility] and informed of Dr. Leeanne Rio 07/23/2019 dressing orders, for dry sterile dressing to the surgery site cover with protective dressing and coban weekly.

## 2019-07-24 NOTE — Telephone Encounter (Signed)
[facility] states pt was seen in office yesterday and was told she was to have dry dressings to the surgery site. [facility] request verbal order for the recommended dressing.

## 2019-08-02 ENCOUNTER — Other Ambulatory Visit: Payer: Medicare Other | Admitting: *Deleted

## 2019-08-02 ENCOUNTER — Other Ambulatory Visit: Payer: Self-pay

## 2019-08-02 DIAGNOSIS — I5022 Chronic systolic (congestive) heart failure: Secondary | ICD-10-CM

## 2019-08-02 DIAGNOSIS — I10 Essential (primary) hypertension: Secondary | ICD-10-CM

## 2019-08-02 DIAGNOSIS — R002 Palpitations: Secondary | ICD-10-CM

## 2019-08-02 LAB — BASIC METABOLIC PANEL
BUN/Creatinine Ratio: 13 (ref 9–23)
BUN: 12 mg/dL (ref 6–24)
CO2: 22 mmol/L (ref 20–29)
Calcium: 9.2 mg/dL (ref 8.7–10.2)
Chloride: 99 mmol/L (ref 96–106)
Creatinine, Ser: 0.92 mg/dL (ref 0.57–1.00)
GFR calc Af Amer: 82 mL/min/{1.73_m2} (ref >59)
GFR calc non Af Amer: 71 mL/min/{1.73_m2} (ref >59)
Glucose: 109 mg/dL — ABNORMAL HIGH (ref 65–99)
Potassium: 4.3 mmol/L (ref 3.5–5.2)
Sodium: 138 mmol/L (ref 134–144)

## 2019-08-13 ENCOUNTER — Telehealth: Payer: Self-pay | Admitting: Endocrinology

## 2019-08-13 NOTE — Telephone Encounter (Signed)
Please schedule pt for appt before refills can be issued.

## 2019-08-13 NOTE — Telephone Encounter (Signed)
MEDICATION: Free Technical brewer  PHARMACY:  [facility] on [address]  IS THIS A 90 DAY SUPPLY :   IS PATIENT OUT OF MEDICATION: yes  IF NOT; HOW MUCH IS LEFT: none  LAST APPOINTMENT DATE: @6 /11/2019  NEXT APPOINTMENT DATE:@Visit  date not found  DO WE HAVE YOUR PERMISSION TO LEAVE A DETAILED MESSAGE: yes  OTHER COMMENTS:    **Let patient know to contact pharmacy at the end of the day to make sure medication is ready. **  ** Please notify patient to allow 48-72 hours to process**  **Encourage patient to contact the pharmacy for refills or they can request refills through [facility]**

## 2019-08-13 NOTE — Telephone Encounter (Signed)
LMTCB and schedule an appointment to see Dr Dwyane Dee.

## 2019-08-14 ENCOUNTER — Other Ambulatory Visit: Payer: Self-pay

## 2019-08-14 MED ORDER — FREESTYLE LIBRE 14 DAY SENSOR MISC: 2.0000 | 0 refills | Status: DC

## 2019-08-14 NOTE — Telephone Encounter (Signed)
Called pt to verify which pharmacy she would like libre sensors sent to. She confirmed that it ws [facility] on [address] 2 sensors with 0 refills was sent there. Pt was advised that when she comes to her lab appointment she should bring her [place] reader and give it to staff to download in order for Dr. Dwyane Dee to conduct the telephone call visit. Pt verbalized understanding of this.

## 2019-08-14 NOTE — Telephone Encounter (Signed)
Patient is scheduled for MD to Patient on 09/03/19 at 2:45 p.m. Patient is scheduled for labs on 08/22/19. Patient is unable to check her blood sugars without the RX refill for the [employer].

## 2019-08-19 ENCOUNTER — Telehealth: Payer: Self-pay | Admitting: Sports Medicine

## 2019-08-19 NOTE — Telephone Encounter (Signed)
Called pt we received message from Dr Dwyane Dee that pt needed an appt.   Pt states she has an appt with them this month and they still should have paperwork but I will refax it.

## 2019-08-20 ENCOUNTER — Other Ambulatory Visit: Payer: Self-pay | Admitting: Endocrinology

## 2019-08-20 DIAGNOSIS — E1165 Type 2 diabetes mellitus with hyperglycemia: Secondary | ICD-10-CM

## 2019-08-20 DIAGNOSIS — E1142 Type 2 diabetes mellitus with diabetic polyneuropathy: Secondary | ICD-10-CM

## 2019-08-22 ENCOUNTER — Telehealth: Payer: Self-pay | Admitting: Endocrinology

## 2019-08-22 ENCOUNTER — Other Ambulatory Visit (INDEPENDENT_AMBULATORY_CARE_PROVIDER_SITE_OTHER): Payer: Medicare Other

## 2019-08-22 ENCOUNTER — Other Ambulatory Visit: Payer: Self-pay

## 2019-08-22 DIAGNOSIS — E1142 Type 2 diabetes mellitus with diabetic polyneuropathy: Secondary | ICD-10-CM

## 2019-08-22 DIAGNOSIS — Z794 Long term (current) use of insulin: Secondary | ICD-10-CM

## 2019-08-22 DIAGNOSIS — E1165 Type 2 diabetes mellitus with hyperglycemia: Secondary | ICD-10-CM

## 2019-08-22 LAB — LIPID PANEL
Cholesterol: 118 mg/dL (ref 0–200)
HDL: 37 mg/dL — ABNORMAL LOW (ref >39.00)
NonHDL: 80.57
Total CHOL/HDL Ratio: 3
Triglycerides: 207 mg/dL — ABNORMAL HIGH (ref 0.0–149.0)
VLDL: 41.4 mg/dL — ABNORMAL HIGH (ref 0.0–40.0)

## 2019-08-22 LAB — COMPREHENSIVE METABOLIC PANEL
ALT: 10 U/L (ref 0–35)
AST: 13 U/L (ref 0–37)
Albumin: 3.8 g/dL (ref 3.5–5.2)
Alkaline Phosphatase: 55 U/L (ref 39–117)
BUN: 13 mg/dL (ref 6–23)
CO2: 29 mEq/L (ref 19–32)
Calcium: 9.1 mg/dL (ref 8.4–10.5)
Chloride: 99 mEq/L (ref 96–112)
Creatinine, Ser: 0.73 mg/dL (ref 0.40–1.20)
GFR: 83 mL/min (ref >60.00)
Glucose, Bld: 89 mg/dL (ref 70–99)
Potassium: 4 mEq/L (ref 3.5–5.1)
Sodium: 136 mEq/L (ref 135–145)
Total Bilirubin: 0.2 mg/dL (ref 0.2–1.2)
Total Protein: 6.5 g/dL (ref 6.0–8.3)

## 2019-08-22 LAB — LDL CHOLESTEROL, DIRECT: Direct LDL: 57 mg/dL

## 2019-08-22 LAB — HEMOGLOBIN A1C: Hgb A1c MFr Bld: 6.5 % (ref 4.6–6.5)

## 2019-08-22 NOTE — Telephone Encounter (Signed)
We do not do those prescriptions.  She needs to talk to her orthopedic surgeon

## 2019-08-22 NOTE — Telephone Encounter (Signed)
After she has her visit?

## 2019-08-22 NOTE — Telephone Encounter (Signed)
Pt states she needs a script for toe fillers for left shoe to [facility] [address]. Right prosthetic liner to [facility]. Please advise? Thank you!

## 2019-09-03 ENCOUNTER — Other Ambulatory Visit: Payer: Self-pay

## 2019-09-03 ENCOUNTER — Ambulatory Visit (INDEPENDENT_AMBULATORY_CARE_PROVIDER_SITE_OTHER): Payer: Medicare Other | Admitting: Endocrinology

## 2019-09-03 ENCOUNTER — Encounter: Payer: Self-pay | Admitting: Endocrinology

## 2019-09-03 VITALS — BP 138/72 | HR 84 | Ht 65.0 in | Wt 167.4 lb

## 2019-09-03 DIAGNOSIS — S88119S Complete traumatic amputation at level between knee and ankle, unspecified lower leg, sequela: Secondary | ICD-10-CM | POA: Diagnosis not present

## 2019-09-03 DIAGNOSIS — Z23 Encounter for immunization: Secondary | ICD-10-CM

## 2019-09-03 DIAGNOSIS — Z794 Long term (current) use of insulin: Secondary | ICD-10-CM | POA: Diagnosis not present

## 2019-09-03 DIAGNOSIS — E1165 Type 2 diabetes mellitus with hyperglycemia: Secondary | ICD-10-CM

## 2019-09-03 DIAGNOSIS — E1142 Type 2 diabetes mellitus with diabetic polyneuropathy: Secondary | ICD-10-CM

## 2019-09-03 LAB — GLUCOSE, POCT (MANUAL RESULT ENTRY): POC Glucose: 101 mg/dl — AB (ref 70–99)

## 2019-09-03 NOTE — Progress Notes (Signed)
Patient ID: [name], female   DOB: 10-Aug-1966, 53 y.o.   MRN: [id number]   Reason for Appointment: Diabetes follow-up    History of Present Illness:   Diagnosis: Type 2 diabetes mellitus, date of diagnosis: 2004.  PAST history: She has been treated mostly with insulin since about a year after diagnosis. She has had difficulty with consistent compliance with diet and also compliance with self care including glucose monitoring over the years. She had been mostly treated with basal insulin. Also had been tried on mealtime insulin but she would be noncompliant with this. Was tried on Prandin for mealtime control but difficult to judge efficacy because of lack of postprandial monitoring. Was given Victoza to start in 2011 but did not follow up after this.She was tried on premixed insulin but this did not help her control, mostly because of noncompliance with the doses. She had been using the V- go pump and had better control initially and was better compliant with the daily routine and boluses. She has had frequent education visits also. She stopped using her V.-go pump in 6/15 because of discomfort at the site of application, mostly was using this on her abdomen Also did not improve with a trial of Victoza   RECENT history:    INSULIN dose: Basaglar 22 units in am.  NovoLog 0 with meals  Non-insulin hypoglycemic drugs: Metformin ER 1500 mg daily, Jardiance 10 mg daily  Her A1c is 6.5 compared to 9.2 previously  Has not been seen in follow-up since 1/20  Current management, problems identified:   She started using the freestyle libre system a few months ago because she does not like to do fingerstick testing  However she is checking blood sugars only erratically and mostly in the mornings  Also her freestyle libre appears to be reading falsely low; today her blood sugar was 75 with the freestyle libre and 101 in the office with a One Touch meter  She has only mild  occasional symptoms of low sugars despite blood sugars reportedly being below 70 about 38% of the time but  Also her overall average blood sugar is only 88 which is much lower than expected for her A1c also  She is eating 1 meal daily in the evenings mostly  However she thinks that she is generally able to cut back on juices, sweet tea and regular soft drinks although today she did have sweet tea before she came in  Has not changed her Basaglar dosage since her last visit  She is also on her own stopped taking NovoLog  Although previously she had stopped taking Jardiance prior to her last visit she seems to have started back again with 10 mg and does not complain of any recent UTIs  Also in the past had been on metformin and she started back on this, does not think she has any side effects  Over the last few months she also has lost weight although some of this may be due to her amputation   Glucometer:  FreeStyle.   Blood Glucose readings from freestyle libre  CGM use % of time  37  2-week average/SD  88  Time in range        59 %  % Time Above 180  3  % Time above 250   % Time Below 70  38     PRE-MEAL Fasting Lunch Dinner Bedtime Overall  Glucose range:       Averages:  63  102  124 ?  163  88   OVERNIGHT average lowest 63   Food preferences: eating Mostly 1 meal per day around 6-8 pm, variable intake, sandwiches at times or otherwise snacks,  Physical activity: exercise: Minimal.  Certified Diabetes Educator visit: Most recent:, 2/14.     Wt Readings from Last 3 Encounters:  09/03/19 167 lb 6.4 oz (75.9 kg)  07/12/19 166 lb (75.3 kg)  05/10/19 177 lb (80.3 kg)   DM labs:   Lab Results  Component Value Date   HGBA1C 6.5 08/22/2019   HGBA1C 9.2 (A) 01/08/2019   HGBA1C 10.6 (H) 07/03/2018   Lab Results  Component Value Date   MICROALBUR 1.1 01/08/2019   LDLCALC 37 04/12/2018   CREATININE 0.73 08/22/2019       Other active problems: See review of  systems    Allergies as of 09/03/2019      Reactions   Latex Itching, Rash, Other (See Comments)   Pt states she cannot use condoms - cause an infection.  Use of latex on skin is okay for short period of time.  Tape causes rash   Sweetening Enhancer [flavoring Agent] Nausea And Vomiting, Other (See Comments)   HEADACHES   Ropinirole    Pt stated, "I am getting cramps in my legs - especially at night"   Aspartame And Phenylalanine Nausea And Vomiting   HEADACHES   Ibuprofen Other (See Comments)   HEADACHES   Trazodone And Nefazodone Other (See Comments)   Hallucinations      Medication List       Accurate as of September 03, 2019  3:06 PM. If you have any questions, ask your nurse or doctor.        STOP taking these medications   amoxicillin 500 MG capsule Commonly known as: AMOXIL Stopped by: Elayne Snare, MD   insulin aspart 100 UNIT/ML FlexPen Commonly known as: NovoLOG FlexPen Stopped by: Elayne Snare, MD     TAKE these medications   aspirin-acetaminophen-caffeine [phone] MG tablet Commonly known as: EXCEDRIN MIGRAINE Take 2 tablets by mouth every 6 (six) hours as needed for headache.   Basaglar KwikPen 100 UNIT/ML Sopn Inject 0.22 mLs (22 Units total) into the skin daily.   bisoprolol 5 MG tablet Commonly known as: ZEBETA Take 0.5 tablets (2.5 mg total) by mouth daily.   cyclobenzaprine 10 MG tablet Commonly known as: FLEXERIL TAKE 1 TABLET BY MOUTH THREE TIMES A DAY AS NEEDED FOR MUSCLE SPASMS   diazepam 5 MG tablet Commonly known as: VALIUM Take 5 mg by mouth 2 (two) times daily.   docusate sodium 100 MG capsule Commonly known as: Colace Take 1 capsule (100 mg total) by mouth daily as needed.   esomeprazole 40 MG capsule Commonly known as: NEXIUM Take 40 mg by mouth daily.   fenofibrate 48 MG tablet Commonly known as: TRICOR Take 48 mg by mouth daily.   FreeStyle Libre 14 Day Sensor Misc 2 each by Does not apply route every 14 (fourteen) days.  Use 1 sensor every 14 days to monitor blood sugar.   gabapentin 600 MG tablet Commonly known as: NEURONTIN Take 600 mg by mouth 3 (three) times daily.   glucose blood test strip Commonly known as: FREESTYLE TEST STRIPS USE TO CHECK BLOOD SUGARS DAILY. DX CODE E11.65   Ingrezza 80 MG Caps Generic drug: Valbenazine Tosylate Take 80 mg by mouth at bedtime.   Jardiance 10 MG Tabs tablet Generic drug: empagliflozin Take 1 tablet by mouth  once daily at breakfast.   levofloxacin 500 MG tablet Commonly known as: LEVAQUIN Take 1 tablet (500 mg total) by mouth daily.   Linzess 72 MCG capsule Generic drug: linaclotide   losartan 25 MG tablet Commonly known as: COZAAR Take 1 tablet (25 mg total) by mouth daily.   metFORMIN 750 MG 24 hr tablet Commonly known as: GLUCOPHAGE-XR Take 750 mg by mouth 2 (two) times daily.   oxyCODONE-acetaminophen 10-325 MG tablet Commonly known as: PERCOCET   pantoprazole 40 MG tablet Commonly known as: PROTONIX   promethazine 25 MG tablet Commonly known as: PHENERGAN Take 1 tablet (25 mg total) by mouth every 6 (six) hours as needed for nausea or vomiting.   QUEtiapine 300 MG tablet Commonly known as: SEROQUEL Take 600 mg by mouth at bedtime. One hour before bed   tiZANidine 4 MG tablet Commonly known as: ZANAFLEX TAKE 1 TABLET BY ORAL ROUTE EVERY 8 HOURS AS NEEDED NOT TO EXCEED 3 DOSES IN 24 HOURS AS NEEDED   Vraylar 6 MG Caps Generic drug: Cariprazine HCl Take 6 mg by mouth at bedtime.       Allergies:  Allergies  Allergen Reactions  . Latex Itching, Rash and Other (See Comments)    Pt states she cannot use condoms - cause an infection.  Use of latex on skin is okay for short period of time.  Tape causes rash  . Sweetening Enhancer [Flavoring Agent] Nausea And Vomiting and Other (See Comments)    HEADACHES  . Ropinirole     Pt stated, "I am getting cramps in my legs - especially at night"  . Aspartame And Phenylalanine Nausea And  Vomiting    HEADACHES  . Ibuprofen Other (See Comments)    HEADACHES  . Trazodone And Nefazodone Other (See Comments)    Hallucinations     Past Medical History:  Diagnosis Date  . Anxiety   . Arthritis   . Bipolar disorder ([place])   . Broken jaw ([place])   . Broken wrist   . Chronic back pain   . Claudication ([place])    a. 12/2013 ABI's: R 0.97, L 0.94.  [name] Colon cancer ([place])   . COPD (chronic obstructive pulmonary disease) ([place])   . Depression   . Diabetic Charcot's foot ([place])   . Diabetic foot ulcer ([facility])    chronic left foot ulcer , great toe/  hx recurrent foot ulcer bilaterally  . DJD (degenerative joint disease)   . Edema of both lower extremities   . Episode of memory loss   . Family history of bladder cancer   . Family history of colon cancer   . Family history of kidney cancer   . GERD (gastroesophageal reflux disease)   . Headache(784.0)   . Hiatal hernia   . History of colon cancer, stage I dx 02-26-2015--- oncologist-- dr [name]--- per last note no recurrence   05-16-2015 s/p  Laparoscopic sigmoid colectomy w/ node bx's (negative nodes per path)  Stage I (pT1,N0,M0) Grade 2  . 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   per MRI multiple small strokes post cardiac cath. --  residual mild memory loss  . History of methicillin resistant staphylococcus aureus (MRSA)   . Hypercholesterolemia   . Hypertension   . Insomnia   . Insulin dependent type 2 diabetes mellitus, uncontrolled ([place]) dx 2004   endocrinologist-  dr Dwyane Dee-- last A1c 8.8 in Aug2018:  pt is noncompliant w/ diet, states does note  eat breakfast , her first main meal in afternoon  . Neurogenic bladder   . Neuropathy, diabetic ([place])    hands and feet  . OSA (obstructive sleep apnea)    cpap intolerant  . Personality disorder ([place])   . Restless leg syndrome   . SOB (shortness of breath) on exertion   . Stroke ([place])   . SUI (stress urinary incontinence, female) S/P SLING  12-29-2011    Past Surgical History:  Procedure Laterality Date  . AMPUTATION Right 04/13/2018   Procedure: RIGHT BELOW KNEE AMPUTATION;  Surgeon: Newt Minion, MD;  Location: [facility];  Service: Orthopedics;  Laterality: Right;  . AMPUTATION Left 10/26/2018   Procedure: LEFT FOOT FIRST RAY AMPUTATION;  Surgeon: Newt Minion, MD;  Location: [facility];  Service: Orthopedics;  Laterality: Left;  . AMPUTATION TOE Left 05/10/2019   Procedure: AMPUTATION 2nd left TOE WITH METATARSAL;  Surgeon: Landis Martins, DPM;  Location: [facility];  Service: Podiatry;  Laterality: Left;  . ANTERIOR CERVICAL DECOMP/DISCECTOMY FUSION  2000   C5 - 7  . APPLICATION OF WOUND VAC Left 10/26/2018   Procedure: APPLICATION OF WOUND VAC;  Surgeon: Newt Minion, MD;  Location: [facility];  Service: Orthopedics;  Laterality: Left;  . BACK SURGERY    . CARDIAC CATHETERIZATION  05-22-2008   DR SKAINS   NO SIGNIFECANT CAD/ NORMAL LV/  EF 65%/  NO WALL MOTION ABNORMALITIES  . CARPAL TUNNEL RELEASE Right 04-25-2013  . CATARACT EXTRACTION Right 10/24/2018  . [place]; 1992  . COLON SURGERY    . COLONOSCOPY    . CYSTO N/A 04/30/2013   Procedure: CYSTOSCOPY;  Surgeon: Reece Packer, MD;  Location: [facility];  Service: Urology;  Laterality: N/A;  . CYSTOSCOPY MACROPLASTIQUE IMPLANT N/A 02/06/2018   Procedure: CYSTOSCOPY MACROPLASTIQUE IMPLANT;  Surgeon: Bjorn Loser, MD;  Location: [facility];  Service: Urology;  Laterality: N/A;  . CYSTOSCOPY WITH INJECTION  05/04/2012   Procedure: CYSTOSCOPY WITH INJECTION;  Surgeon: Reece Packer, MD;  Location: [facility];  Service: Urology;  Laterality: N/A;  MACROPLASTIQUE INJECTION  . CYSTOSCOPY WITH INJECTION  08/28/2012   Procedure: CYSTOSCOPY WITH INJECTION;  Surgeon: Reece Packer, MD;  Location: [facility];  Service: Urology;  Laterality: N/A;  cysto and macroplastique   . FOOT SURGERY Bilateral    "related to [place]"   . HERNIA REPAIR  ?1996   "stomach"  . I&D EXTREMITY Left 10/25/2018   Procedure: IRRIGATION AND DEBRIDEMENT GREAT TOE;  Surgeon: Meredith Pel, MD;  Location: [facility];  Service: Orthopedics;  Laterality: Left;  . INCISION AND DRAINAGE Left 10/25/2018   GREAT TOE  . INCISION AND DRAINAGE OF WOUND Right 04/11/2018   Procedure: IRRIGATION AND DEBRIDEMENT WOUND right foot and right ankle;  Surgeon: Evelina Bucy, DPM;  Location: [facility];  Service: Podiatry;  Laterality: Right;  . KNEE ARTHROSCOPY Left   . KNEE ARTHROSCOPY W/ ALLOGRAFT IMPANT Left    graft x 2  . KNEE SURGERY     TOTAL 8 SURG'S  . LAPAROSCOPIC SIGMOID COLECTOMY N/A 04/22/2015   Procedure: LAPAROSCOPIC HAND ASSISTED SIGMOID COLECTOMY;  Surgeon: Erroll Luna, MD;  Location: [facility];  Service: General;  Laterality: N/A;  . LEFT HEART CATHETERIZATION WITH CORONARY ANGIOGRAM N/A 02/12/2014   Procedure: LEFT HEART CATHETERIZATION WITH CORONARY ANGIOGRAM;  Surgeon: Candee Furbish, MD;  Location: [facility];  Service: Cardiovascular;  Laterality: N/A;   No angiographically  significant CAD; normal LVSF, LVEDP 66mmHg,  EF 55% (new finding ef 30% myoview 01-08-2014)  . LUMBAR FUSION    . MANDIBLE FRACTURE SURGERY    . MULTIPLE LAPAROSCOPIES FOR ENDOMETRIOSIS    . PUBOVAGINAL SLING  12/29/2011   Procedure: Gaynelle Arabian;  Surgeon: Reece Packer, MD;  Location: [facility];  Service: Urology;  Laterality: N/A;  cysto and sparc sling   . PUBOVAGINAL SLING N/A 04/30/2013   Procedure: REMOVAL OF VAGINAL MESH;  Surgeon: Reece Packer, MD;  Location[facility];  Service: Urology;  Laterality: N/A;  . RECONSTURCTION OF CONGENITAL UTERUS ANOMALY  1983  . REPEAT RECONSTRUCTION ACL LEFT KNEE/ SCREWS REMOVED  03-28-2000   CADAVER GRAFT  . TOTAL ABDOMINAL HYSTERECTOMY  1997   w/BSO  . TRANSTHORACIC ECHOCARDIOGRAM  02/13/2014   ef 45%, hypokinesis base inferior and base inferolateral walls  . UPPER GI ENDOSCOPY    . WOUND  DEBRIDEMENT Right 09/05/2016   Procedure: DEBRIDEMENT WOUND WITH GRAFT RIGHT FOOT;  Surgeon: Landis Martins, DPM;  Location: [facility];  Service: Podiatry;  Laterality: Right;  . WRIST FRACTURE SURGERY      Family History  Problem Relation Age of Onset  . Hypertension Mother   . Diabetes Mother   . Cancer - Other Mother        lymphoma   . Cancer - Other Father        lung, bladder cancer   . Heart attack Father   . Other Father        Lynch syndrome  . Cancer - Other Brother        bladder cancer   . Bladder Cancer Paternal Aunt        Lynch syndrome  . Other Paternal Uncle        Lynch syndrome  . Other Cousin        Lynch syndrome    Social History:  reports that she has been smoking cigarettes. She has a 92.00 pack-year smoking history. She has never used smokeless tobacco. She reports current alcohol use. She reports previous drug use.  Review of Systems -    She has history of high triglycerides previously treated with fenofibrate and is not on any treatment    Lab Results  Component Value Date   CHOL 118 08/22/2019   HDL 37.00 (L) 08/22/2019   LDLCALC 37 04/12/2018   LDLDIRECT 57.0 08/22/2019   TRIG 207.0 (H) 08/22/2019   CHOLHDL 3 08/22/2019     HYPOKALEMIA/hyponatremia: Resolved  Lab Results  Component Value Date   CREATININE 0.73 08/22/2019   BUN 13 08/22/2019   NA 136 08/22/2019   K 4.0 08/22/2019   CL 99 08/22/2019   CO2 29 08/22/2019    Painful neuropathy: She has had persistent symptoms including leg pains tingling and numbness as well as difficulty with balance  She has tried various drugs including gabapentin without much relief She is asking about alternatives but does not want Lyrica because of potential for weight gain Duloxetine has not apparently been discussed but currently is on another antidepressants from psychiatrist   Foot exams: Last showed absent pedal pulses and Absent distal sensation  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 Gershon Crane: Done in 1/19  HYPERTENSION: Apparently her blood pressure was higher and she is now getting 25 mg losartan supervised by cardiologist Recently started on 2.5 mg bisoprolol for history of palpitations Previously had  been on Lotrel from PCP  Also on Jardiance  BP Readings from Last 3 Encounters:  09/03/19 138/72  07/12/19 112/68  05/24/19 119/75   Is asking about some burning with urination and she thinks she has yeast infections but no itching or discharge symptoms present     Examination:   BP 138/72 (BP Location: Left Arm, Patient Position: Sitting, Cuff Size: Normal)   Pulse 84   Ht 5\' 5"  (1.651 m)   Wt 167 lb 6.4 oz (75.9 kg)   SpO2 97%   BMI 27.86 kg/m   Body mass index is 27.86 kg/m.      Assesment/Plan:   Diabetes type 2, with persistently poor control, insulin requiring  See history of present illness for detailed discussion of current diabetes management, blood sugar patterns and problems identified  Her A1c is markedly improved at 6.5  Her blood sugars are much better than usual This is likely to be from her cutting back significantly on regular soft drinks, juices and sweet tea Also has lost weight Also likely benefiting from taking metformin and Jardiance consistently now As discussed above her freestyle Elenor Legato is reading falsely low and indicating hypoglycemia which is not present However not clear what her postprandial readings are   Recommendations:  Eliminate sweet tea completely  Check blood sugars regularly 3-4 times a day since she has her monitor available, this will allow [place] to get a more consistent pattern  Make sure she checks her readings 2 hours after dinner  Discussed that her freestyle libre readings need to be adjusted up by at least 20 to 30 mg to get the actual reading  Basaglar to 18 units as a precaution  Needs more regular follow-up  She will talk to her orthopedic  surgeon regarding any prosthetic products that therefore Dr. had recommended  NEUROPATHY: Although Lyrica may be options she is again not wanting to try this because of fear of weight gain She will check with her psychiatrist about using duloxetine concomitantly with other antidepressants  HYPERTENSION: Mild and followed by cardiologist, relatively well controlled Will need to check microalbumin on her next visit also  Dysuria: Check UTI, discussed that this is not a symptom of yeast infections  Influenza vaccine given  There are no Patient Instructions on file for this visit.    Counseling time on subjects discussed in assessment and plan sections is over 50% of today's 25 minute visit    Elayne Snare 09/03/2019, 3:06 PM

## 2019-09-03 NOTE — Patient Instructions (Addendum)
Check blood sugars on waking up 5-6  days a week  Also check blood sugars about 2 hours after meals and do this after different meals by rotation  Recommended blood sugar levels on waking up are 90-130 and about 2 hours after meal is 130-160  Please bring your blood sugar monitor to each visit, thank you  basaglar 18 units daily  Check Duloxitene rx from Psych.

## 2019-09-04 ENCOUNTER — Other Ambulatory Visit: Payer: Self-pay

## 2019-09-04 LAB — URINALYSIS, ROUTINE W REFLEX MICROSCOPIC
Bilirubin Urine: NEGATIVE
Hgb urine dipstick: NEGATIVE
Ketones, ur: NEGATIVE
Leukocytes,Ua: NEGATIVE
Nitrite: NEGATIVE
RBC / HPF: NONE SEEN (ref 0–?)
Specific Gravity, Urine: <=1.005 — AB (ref 1.000–1.030)
Total Protein, Urine: NEGATIVE
Urine Glucose: 500 — AB
Urobilinogen, UA: 0.2 (ref 0.0–1.0)
pH: 6.5 (ref 5.0–8.0)

## 2019-09-04 NOTE — Addendum Note (Signed)
Addended by: Kaylyn Lim I on: 09/04/2019 07:40 AM   Modules accepted: Orders

## 2019-09-04 NOTE — Progress Notes (Signed)
Please call to let patient know that the urinalysis results are normal and no further action needed

## 2019-09-08 IMAGING — CT CT HEAD W/O CM
3 series · 14 of 47 positions shown, 16 images · non-contrast
Comparison: Head CT 04/09/2018

CLINICAL DATA: Fall with headache.

EXAM:
CT HEAD WITHOUT CONTRAST
TECHNIQUE: Contiguous axial images were obtained from the base of the skull
through the vertex without intravenous contrast.

[Series 3: head 5.0 h30s · axial · 0.43mm/px · z∈[-158,-28]mm · 8 of 32 slices shown, 10 images]
[im 3/32  brain]
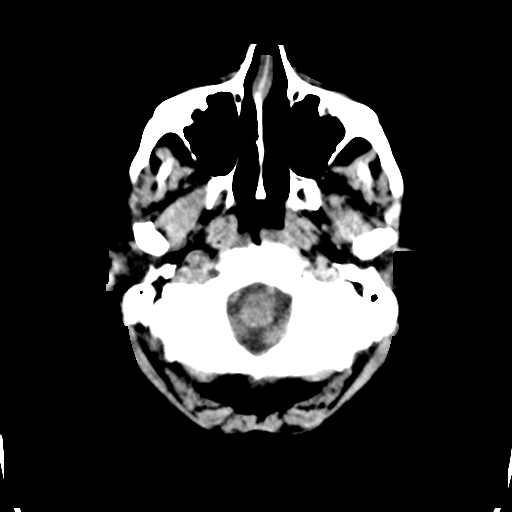
[im 3/32  bone]
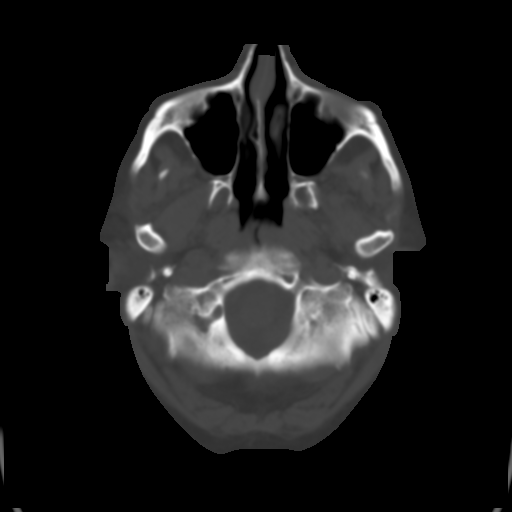
[im 7/32  brain]
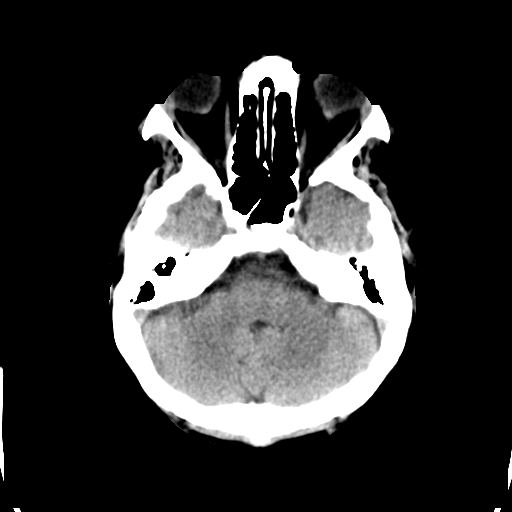
[im 10/32  brain]
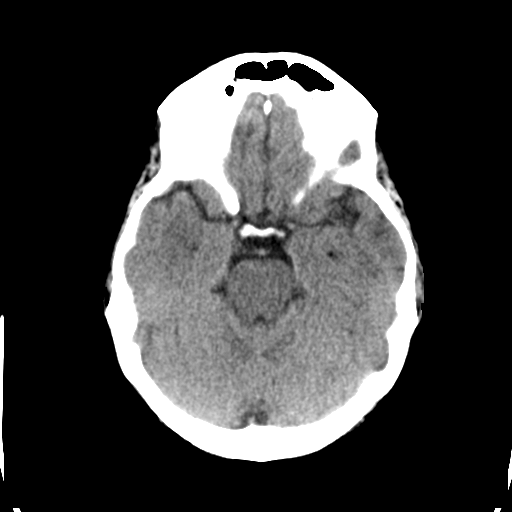
[im 14/32  brain]
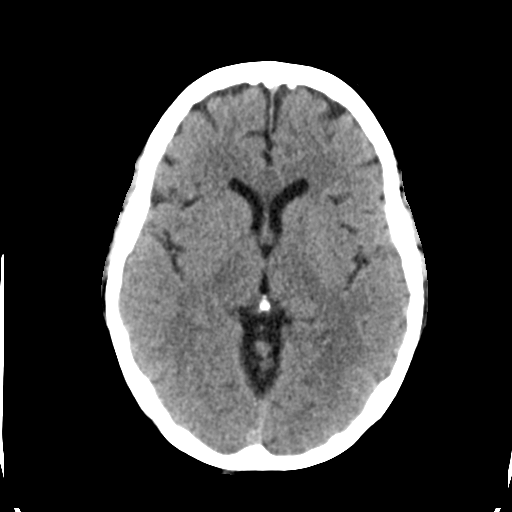
[im 18/32  brain]
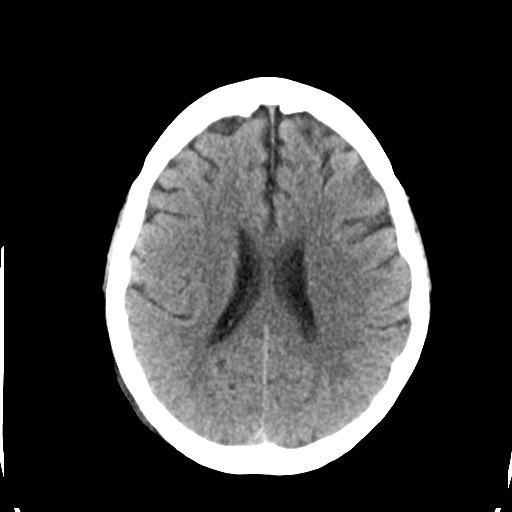
[im 18/32  bone]
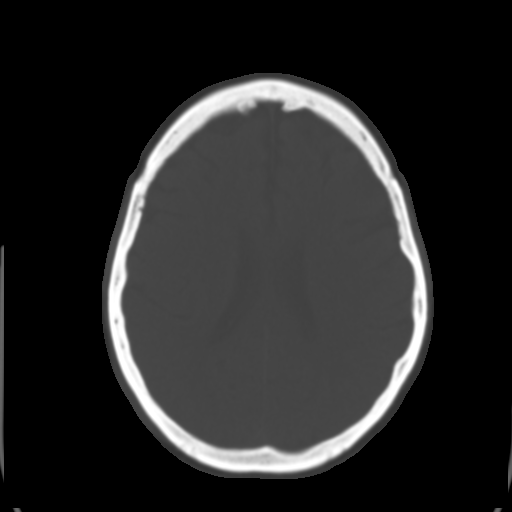
[im 22/32  brain]
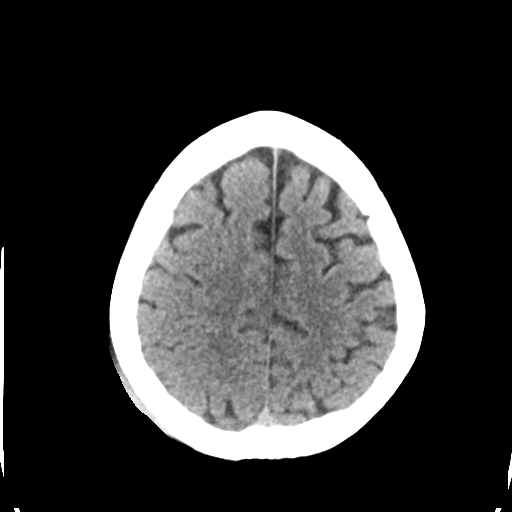
[im 25/32  brain]
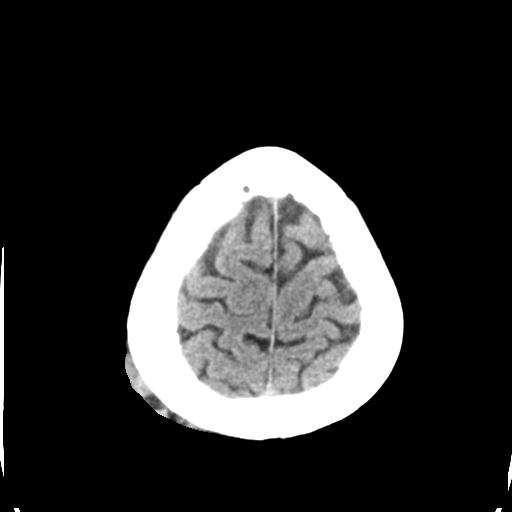
[im 29/32  brain]
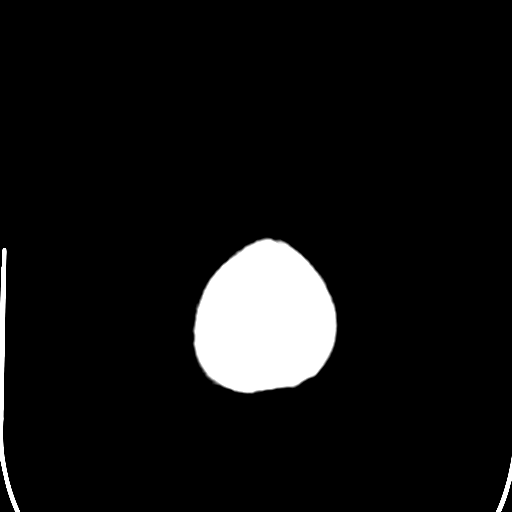

[Series 5: head 3.0 mpr cor · coronal · 0.32mm/px · 3 of 63 slices shown]
[im 21/63  brain]
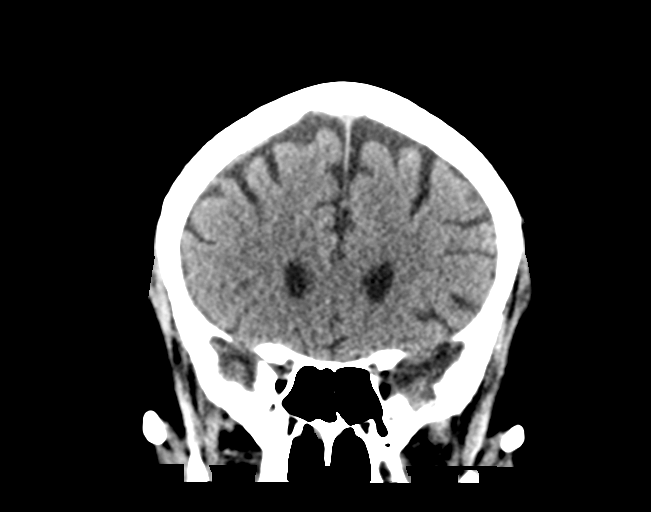
[im 28/63  brain]
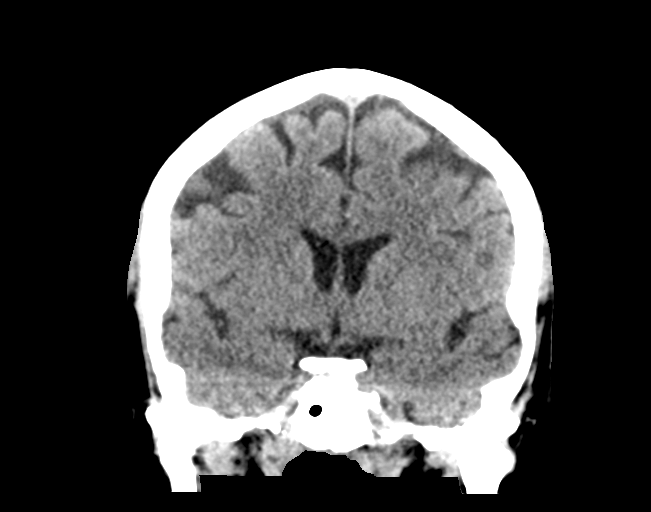
[im 35/63  brain]
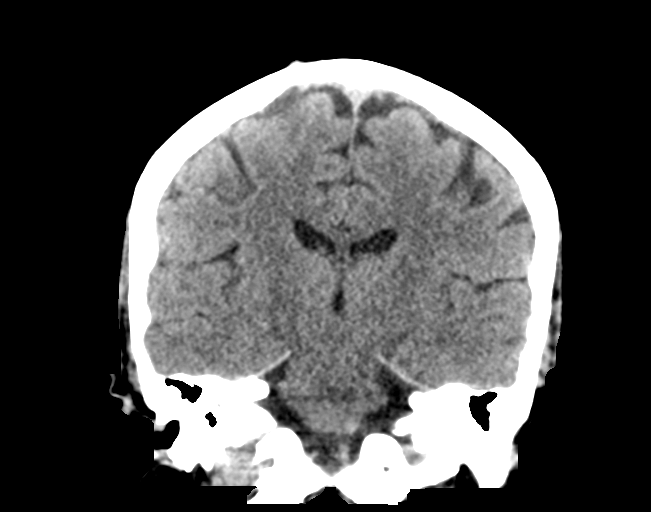

[Series 6: head 3.0 mpr sag · sagittal · 0.34mm/px · 3 of 53 slices shown]
[im 18/53  brain]
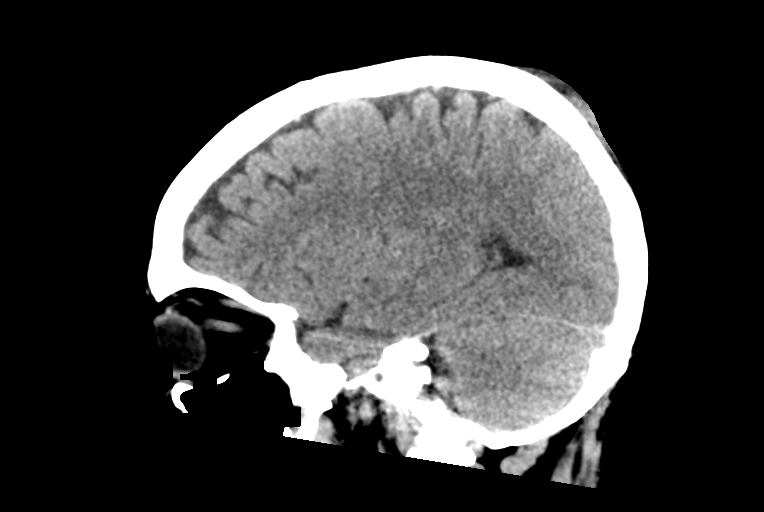
[im 27/53  brain]
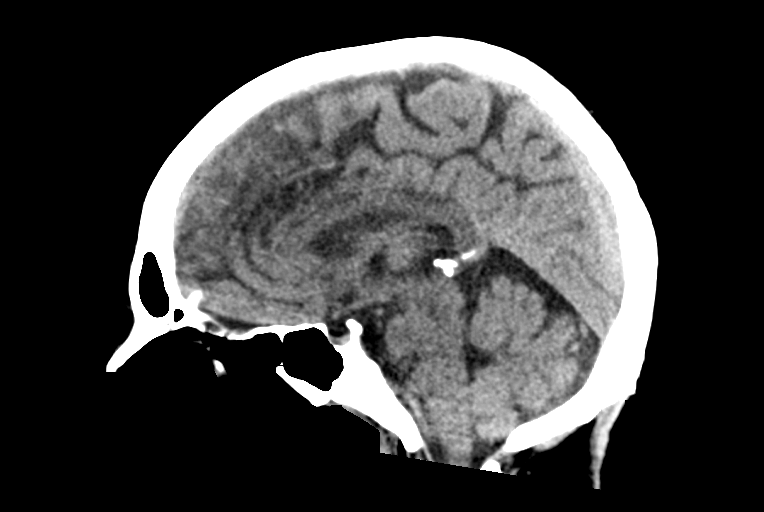
[im 35/53  brain]
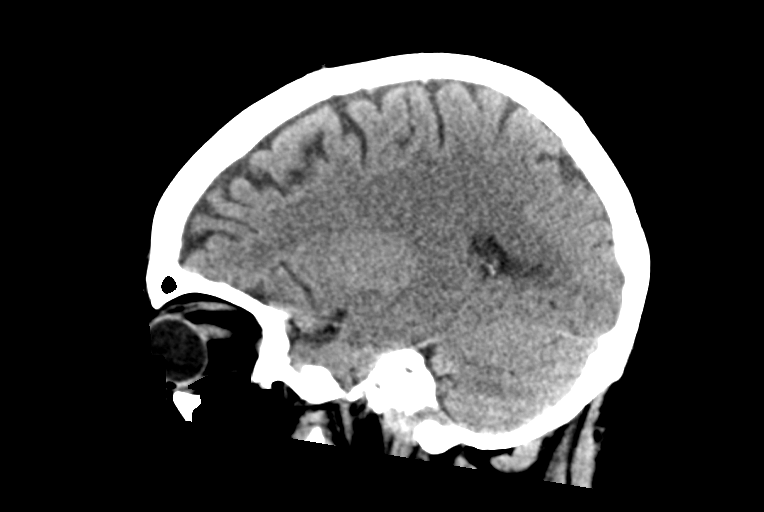

[14 of 47 positions shown; findings below may reference images not displayed]

FINDINGS: Brain: No mass lesion, intraparenchymal hemorrhage or extra-axial
collection. No evidence of acute cortical infarct. Normal appearance
of the brain parenchyma and extra axial spaces for age.

Vascular: No hyperdense vessel or unexpected vascular calcification.

Skull: Right parietal scalp hematoma.  No skull fracture.

Sinuses/Orbits: No sinus fluid levels or advanced mucosal
thickening. No mastoid effusion. Normal orbits.
IMPRESSION: 1. No acute intracranial abnormality.
2. Right parietal scalp hematoma without skull fracture.

## 2019-09-09 ENCOUNTER — Other Ambulatory Visit: Payer: Self-pay | Admitting: Endocrinology

## 2019-09-09 ENCOUNTER — Telehealth: Payer: Self-pay | Admitting: *Deleted

## 2019-09-09 NOTE — Telephone Encounter (Signed)
Pt called states she needs Dr. Cannon Kettle to send a referral to Dr.Duda for her toe filler shoe, she went to her diabetes doctor and he didn't know anything about it and said the doctor who did the toe amputations needed to write the approval.

## 2019-09-09 NOTE — Telephone Encounter (Signed)
If she is talking about insoles for her shoe with the toe filler then we can do that in our office. I will have [place] call her to clarify what the patient is referring to.

## 2019-09-09 NOTE — Telephone Encounter (Signed)
Called pt and she said Dr Dwyane Dee does not know anything about the toefiller inserts. I explained we have ordered them that Dr Dwyane Dee has to sign off that pt is a diabetic and missing toes. He denied it previously stating pt needed an appt. I told pt I would refax paperwork to him with a note attached.

## 2019-09-14 IMAGING — DX DG TIBIA/FIBULA 2V*R*
2 series · 2 of 2 positions shown · non-contrast
Comparison: None.

CLINICAL DATA: 51-year-old female with fever. Recent right
below-the-knee amputation

EXAM:
RIGHT TIBIA AND FIBULA - 2 VIEW

[tibia ap]
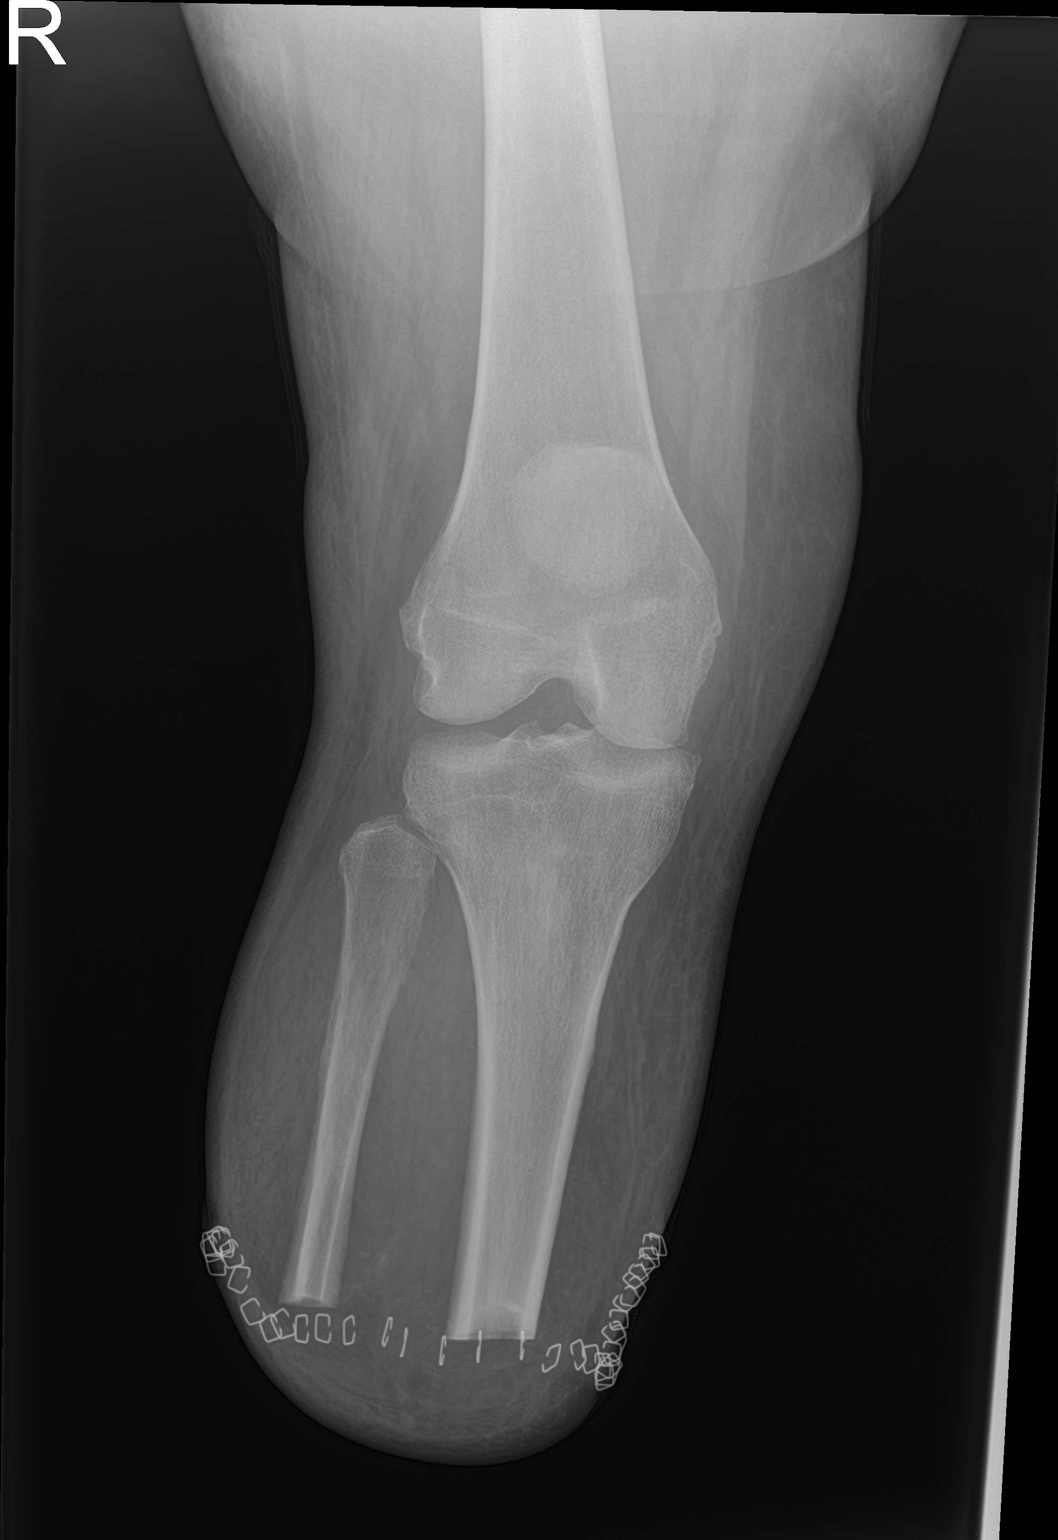

[tibia lat]
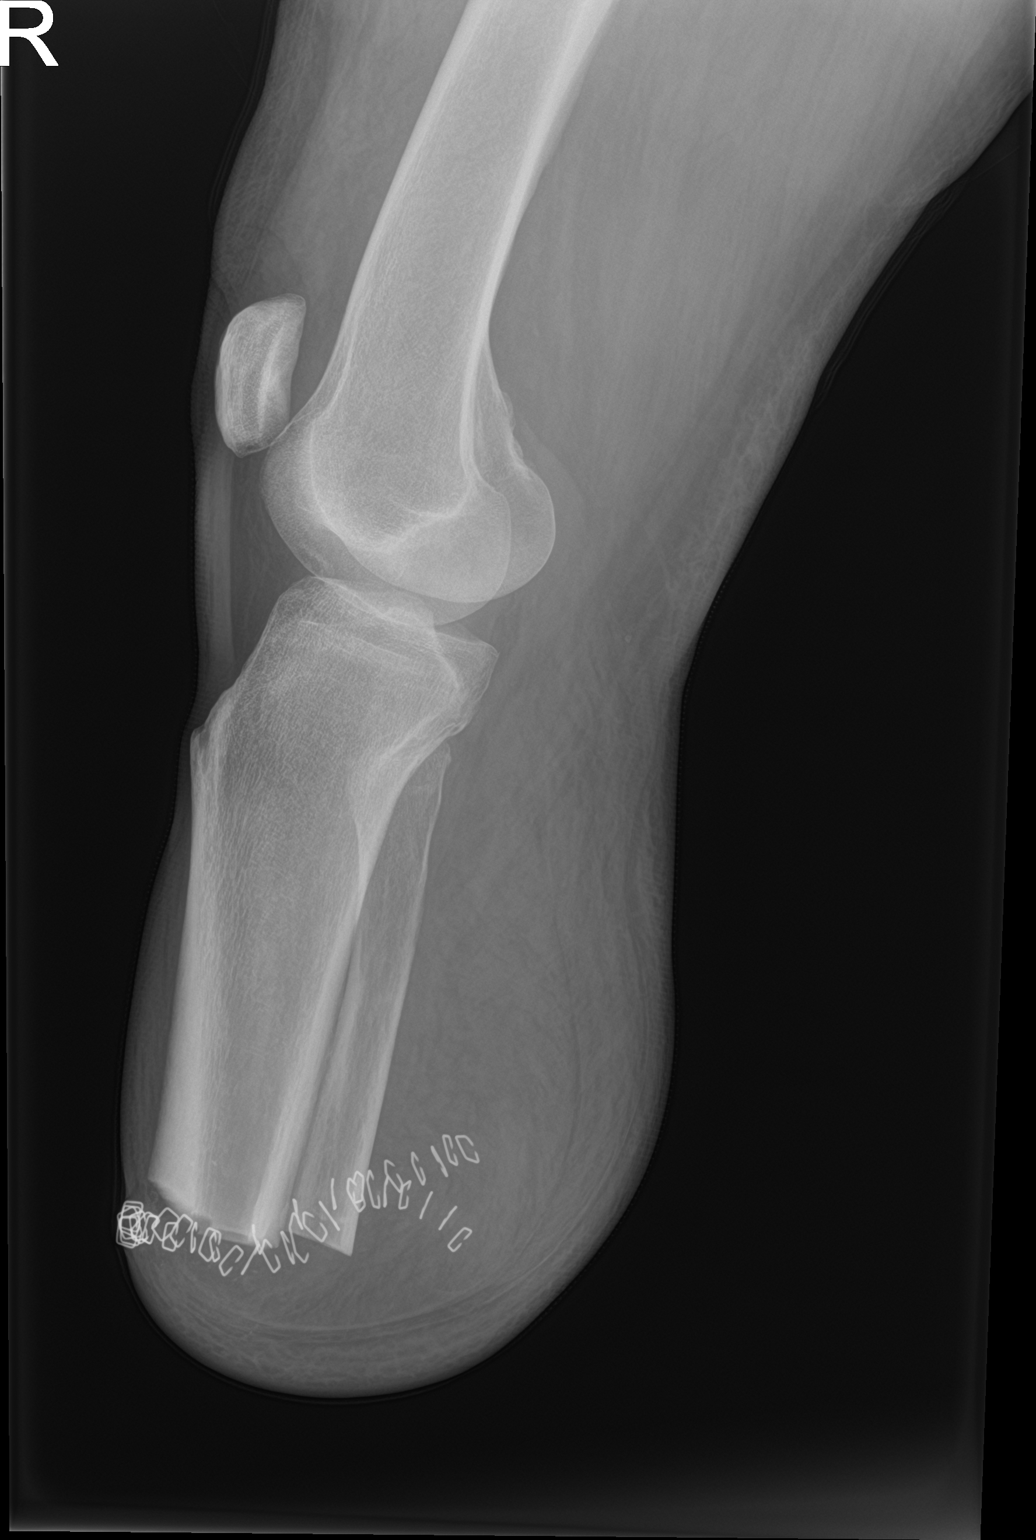

[2 of 2 positions shown; findings below may reference images not displayed]

FINDINGS: Surgical changes of below the knee amputation. Surgical staples are
present. The osteotomy sites are well-marginated. No evidence of
subcutaneous emphysema. No conventional radiographic evidence of
osteomyelitis. Mild soft tissue edema at the stump, not unexpected
postoperative period.
IMPRESSION: 1. Surgical changes of right below-the-knee amputation with expected
postoperative edema.
2. No evidence of subcutaneous emphysema or unexpected bony changes.

## 2019-09-23 ENCOUNTER — Telehealth: Payer: Self-pay | Admitting: Sports Medicine

## 2019-09-23 NOTE — Telephone Encounter (Signed)
Pt left message stating Dr Dwyane Dee refused to sign diabetic paperwork and he told her that Dr Sharol Given who did the amputation should sign it. And she is really needing the toe filler insert.   I returned call and left message that Dr Sharol Given cannot sign the diabetic shoe paperwork since he is not the one treating her diabetes. It would have to be Dr Dwyane Dee. I left message that I would send the paperwork back to Dr Dwyane Dee since pt had an office visit.

## 2019-10-02 ENCOUNTER — Telehealth: Payer: Self-pay | Admitting: Endocrinology

## 2019-10-02 ENCOUNTER — Telehealth: Payer: Self-pay | Admitting: Sports Medicine

## 2019-10-02 NOTE — Telephone Encounter (Signed)
Called pt and informed her that paperwork was sent last week.

## 2019-10-02 NOTE — Telephone Encounter (Signed)
Patient ph# [phone] called re: Dr. Cannon Kettle from [place] have faxed Dr. Dwyane Dee forms to sign for Toe Filler Shoe several times with no response from our office. Patient states that if Dr. Dwyane Dee does not want to sign for the above shoe, patient requests to be  Referred to a new Endocrinologist. Please call patient at the ph# listed above to advise.

## 2019-10-02 NOTE — Telephone Encounter (Signed)
Pt left message checking to see if Dr Dwyane Dee had signed paperwork for her to get her diabetic shoes/inserts.  I returned call and he has signed the needed paperwork and the inserts are now in the process of being made and expected to be shipped around 10.16. I gave pt this information and that I would call when they come in to schedule an appt.

## 2019-10-15 ENCOUNTER — Ambulatory Visit: Payer: Medicare Other | Admitting: Physician Assistant

## 2019-10-17 ENCOUNTER — Telehealth: Payer: Self-pay | Admitting: Nurse Practitioner

## 2019-10-17 NOTE — Telephone Encounter (Signed)
Received return call from patient. She was not aware of the medications she is taking, including the losartan and bisoprolol prescribed by Dr. Acie Fredrickson in July. She does not have a BP cuff at home so she has not been monitoring her BP. She was scheduled for a virtual visit with Richardson Dopp, PA tomorrow. She does not have a computer or a smart phone so I offered to have her come to the office for an appointment since we will not be able to gain much information over the telephone. She is rescheduled to see Dr. Acie Fredrickson on 10/30. She got her pill bottles and verified that she is taking losartan 25. She is taking bisoprolol 5 mg, not 2.5 mg which was prescribed because she says she cannot split the pills in half. She is aware of date/time/location of appointment and thanked me for the call.

## 2019-10-17 NOTE — Telephone Encounter (Signed)
Left message on patient's voice mail asking her to call back for consent for virtual visit and to reconcile medications

## 2019-10-18 ENCOUNTER — Ambulatory Visit: Payer: Medicare Other | Admitting: Physician Assistant

## 2019-10-25 ENCOUNTER — Ambulatory Visit: Payer: Medicare Other | Admitting: Cardiovascular Disease

## 2019-10-30 ENCOUNTER — Telehealth: Payer: Self-pay | Admitting: *Deleted

## 2019-10-30 NOTE — Telephone Encounter (Signed)
I informed pt our [address] [place], [place] [address], [phone].

## 2019-10-30 NOTE — Telephone Encounter (Signed)
Pt states she needs our address and phone number for [employer] Transportation.

## 2019-10-31 ENCOUNTER — Other Ambulatory Visit: Payer: Self-pay

## 2019-11-04 ENCOUNTER — Ambulatory Visit: Payer: Medicare Other | Admitting: Endocrinology

## 2019-11-06 ENCOUNTER — Other Ambulatory Visit: Payer: Self-pay

## 2019-11-06 ENCOUNTER — Encounter: Payer: Self-pay | Admitting: Cardiovascular Disease

## 2019-11-06 ENCOUNTER — Ambulatory Visit (INDEPENDENT_AMBULATORY_CARE_PROVIDER_SITE_OTHER): Payer: Medicare Other | Admitting: Cardiovascular Disease

## 2019-11-06 ENCOUNTER — Telehealth: Payer: Self-pay | Admitting: Cardiovascular Disease

## 2019-11-06 VITALS — BP 112/74 | HR 67 | Ht 65.0 in | Wt 174.8 lb

## 2019-11-06 DIAGNOSIS — I5022 Chronic systolic (congestive) heart failure: Secondary | ICD-10-CM

## 2019-11-06 NOTE — Progress Notes (Signed)
Cardiology Office Note:    Date:  11/06/2019   ID:  [name], DOB 03/11/1966, MRN [id number]  PCP:  [name] Roers, MD  Cardiologist:    [name]  Electrophysiologist:  None   Referring MD: [name] Roers, MD   Chief Complaint  Patient presents with  . Hypertension     Problem list 1.  Peripheral vascular disease -status post right BKA. 2.  Colon cancer -  3.  Diabetes mellitus - Dx in her 4s 4.  Chronic systolic congestive heart failure-ejection fraction 45 to 50%.  July 12, 2019   [name] is a 53 y.o. female with a hx of severe diabetes mellitus, peripheral arterial disease.    We were asked to see her today for further evaluation of her palpitations by Dr. Tamsen [name].   She previously is a patient of Dr. Candee [name].  She has a history of chronic systolic congestive heart failure.  Echo in April 2019 reveled an EF of 45-50%.  Grade 2 diastolic dysfunction   Heart catheterization in February, 2015 revealed normal coronary arteries.  Has heart pounding , has been present for several months. Can last 3-5 minutes She has a right BKA.   Has to crawl to her bedroom.  Her wheelchair and her walker do not fit in her bedroom so she has to go without these devices. The palpitations seem to occur when she is upright kneeling on her knees but not when she is crawling on all fours.  Occurs on most nights but not every night.  It is associated with some shortness of breath.  Does not occur when she is seated.  She uses a walker when she is out and about.  Does not seem to happen when she is walking with a walker.  Smokes 2 ppd   November 06, 2019: Plan is seen today for a follow-up of her peripheral arterial disease. She has a history of chronic systolic congestive heart failure.  Her echocardiogram reveals EF of 45 to 50%.  She has grade 2 diastolic dysfunction. Has shortness of breath early in the am and late at night  Still smoking - 2 PPD ,  Seems to have  little - no interest in stopping  No cp    Past Medical History:  Diagnosis Date  . Anxiety   . Arthritis   . Bipolar disorder ([name])   . Broken jaw ([name])   . Broken wrist   . Chronic back pain   . Claudication ([name])    a. 12/2013 ABI's: R 0.97, L 0.94.  [name] Colon cancer ([place])   . COPD (chronic obstructive pulmonary disease) ([place])   . Depression   . Diabetic Charcot's foot ([name])   . Diabetic foot ulcer (HCC)    chronic left foot ulcer , great toe/  hx recurrent foot ulcer bilaterally  . DJD (degenerative joint disease)   . Edema of both lower extremities   . Episode of memory loss   . Family history of bladder cancer   . Family history of colon cancer   . Family history of kidney cancer   . GERD (gastroesophageal reflux disease)   . Headache(784.0)   . Hiatal hernia   . History of colon cancer, stage I dx 02-26-2015--- oncologist-- dr [name]--- per last note no recurrence   05-16-2015 s/p  Laparoscopic sigmoid colectomy w/ node bx's (negative nodes per path)  Stage I (pT1,N0,M0) Grade 2  . History of CVA with residual deficit  02-12-2014  post op cardiac cath.   per MRI multiple small strokes post cardiac cath. --  residual mild memory loss  . History of methicillin resistant staphylococcus aureus (MRSA)   . Hypercholesterolemia   . Hypertension   . Insomnia   . Insulin dependent type 2 diabetes mellitus, uncontrolled ([place]) dx 2004   endocrinologist-  dr [name]-- last A1c 8.8 in Aug2018:  pt is noncompliant w/ diet, states does note eat breakfast , her first main meal in afternoon  . Neurogenic bladder   . Neuropathy, diabetic ([name])    hands and feet  . OSA (obstructive sleep apnea)    cpap intolerant  . Personality disorder ([place])   . Restless leg syndrome   . SOB (shortness of breath) on exertion   . Stroke ([place])   . SUI (stress urinary incontinence, female) S/P SLING 12-29-2011    Past Surgical History:  Procedure Laterality Date  . AMPUTATION Right 04/13/2018    Procedure: RIGHT BELOW KNEE AMPUTATION;  Surgeon: Newt Minion, MD;  Location: [facility];  Service: Orthopedics;  Laterality: Right;  . AMPUTATION Left 10/26/2018   Procedure: LEFT FOOT FIRST RAY AMPUTATION;  Surgeon: Newt Minion, MD;  Location: [facility];  Service: Orthopedics;  Laterality: Left;  . AMPUTATION TOE Left 05/10/2019   Procedure: AMPUTATION 2nd left TOE WITH METATARSAL;  Surgeon: Landis Martins, DPM;  Location: [facility];  Service: Podiatry;  Laterality: Left;  . ANTERIOR CERVICAL DECOMP/DISCECTOMY FUSION  2000   C5 - 7  . APPLICATION OF WOUND VAC Left 10/26/2018   Procedure: APPLICATION OF WOUND VAC;  Surgeon: Newt Minion, MD;  Location: [facility];  Service: Orthopedics;  Laterality: Left;  . BACK SURGERY    . CARDIAC CATHETERIZATION  05-22-2008   DR SKAINS   NO SIGNIFECANT CAD/ NORMAL LV/  EF 65%/  NO WALL MOTION ABNORMALITIES  . CARPAL TUNNEL RELEASE Right 04-25-2013  . CATARACT EXTRACTION Right 10/24/2018  . [place]; 1992  . COLON SURGERY    . COLONOSCOPY    . CYSTO N/A 04/30/2013   Procedure: CYSTOSCOPY;  Surgeon: Reece Packer, MD;  Location: [facility];  Service: Urology;  Laterality: N/A;  . CYSTOSCOPY MACROPLASTIQUE IMPLANT N/A 02/06/2018   Procedure: CYSTOSCOPY MACROPLASTIQUE IMPLANT;  Surgeon: Bjorn Loser, MD;  Location: [facility];  Service: Urology;  Laterality: N/A;  . CYSTOSCOPY WITH INJECTION  05/04/2012   Procedure: CYSTOSCOPY WITH INJECTION;  Surgeon: Reece Packer, MD;  Location: [facility];  Service: Urology;  Laterality: N/A;  MACROPLASTIQUE INJECTION  . CYSTOSCOPY WITH INJECTION  08/28/2012   Procedure: CYSTOSCOPY WITH INJECTION;  Surgeon: Reece Packer, MD;  Location: [facility];  Service: Urology;  Laterality: N/A;  cysto and macroplastique   . FOOT SURGERY Bilateral    "related to [place]"  . HERNIA REPAIR  ?1996   "stomach"  . I&D EXTREMITY Left 10/25/2018   Procedure: IRRIGATION AND  DEBRIDEMENT GREAT TOE;  Surgeon: Meredith Pel, MD;  Location: [facility];  Service: Orthopedics;  Laterality: Left;  . INCISION AND DRAINAGE Left 10/25/2018   GREAT TOE  . INCISION AND DRAINAGE OF WOUND Right 04/11/2018   Procedure: IRRIGATION AND DEBRIDEMENT WOUND right foot and right ankle;  Surgeon: Evelina Bucy, DPM;  Location: [facility];  Service: Podiatry;  Laterality: Right;  . KNEE ARTHROSCOPY Left   . KNEE ARTHROSCOPY W/ ALLOGRAFT IMPANT Left    graft x 2  . KNEE SURGERY  TOTAL 8 SURG'S  . LAPAROSCOPIC SIGMOID COLECTOMY N/A 04/22/2015   Procedure: LAPAROSCOPIC HAND ASSISTED SIGMOID COLECTOMY;  Surgeon: Erroll Luna, MD;  Location: [facility];  Service: General;  Laterality: N/A;  . LEFT HEART CATHETERIZATION WITH CORONARY ANGIOGRAM N/A 02/12/2014   Procedure: LEFT HEART CATHETERIZATION WITH CORONARY ANGIOGRAM;  Surgeon: [name] Furbish, MD;  Location: [facility];  Service: Cardiovascular;  Laterality: N/A;   No angiographically significant CAD; normal LVSF, LVEDP 83mmHg,  EF 55% (new finding ef 30% myoview 01-08-2014)  . LUMBAR FUSION    . MANDIBLE FRACTURE SURGERY    . MULTIPLE LAPAROSCOPIES FOR ENDOMETRIOSIS    . PUBOVAGINAL SLING  12/29/2011   Procedure: Gaynelle Arabian;  Surgeon: Reece Packer, MD;  Location: [facility];  Service: Urology;  Laterality: N/A;  cysto and sparc sling   . PUBOVAGINAL SLING N/A 04/30/2013   Procedure: REMOVAL OF VAGINAL MESH;  Surgeon: Reece Packer, MD;  [facility];  Service: Urology;  Laterality: N/A;  . RECONSTURCTION OF CONGENITAL UTERUS ANOMALY  1983  . REPEAT RECONSTRUCTION ACL LEFT KNEE/ SCREWS REMOVED  03-28-2000   CADAVER GRAFT  . TOTAL ABDOMINAL HYSTERECTOMY  1997   w/BSO  . TRANSTHORACIC ECHOCARDIOGRAM  02/13/2014   ef 45%, hypokinesis base inferior and base inferolateral walls  . UPPER GI ENDOSCOPY    . WOUND DEBRIDEMENT Right 09/05/2016   Procedure: DEBRIDEMENT WOUND WITH GRAFT RIGHT FOOT;  Surgeon: Landis Martins, DPM;  Location: [facility];  Service: Podiatry;  Laterality: Right;  . WRIST FRACTURE SURGERY      Current Medications: Current Meds  Medication Sig  . aspirin-acetaminophen-caffeine (EXCEDRIN MIGRAINE) 250-250-65 MG tablet Take 2 tablets by mouth every 6 (six) hours as needed for headache.  [name] atorvastatin (LIPITOR) 40 MG tablet Take 40 mg by mouth at bedtime.  . bisoprolol (ZEBETA) 5 MG tablet Take 5 mg by mouth daily.  . Continuous Blood Gluc Sensor (FREESTYLE LIBRE 14 DAY SENSOR) MISC 2 each by Does not apply route every 14 (fourteen) days. Use 1 sensor every 14 days to monitor blood sugar.  . cyclobenzaprine (FLEXERIL) 10 MG tablet TAKE 1 TABLET BY MOUTH THREE TIMES A DAY AS NEEDED FOR MUSCLE SPASMS  . diazepam (VALIUM) 5 MG tablet Take 5 mg by mouth 2 (two) times daily.   [name] docusate sodium (COLACE) 100 MG capsule Take 1 capsule (100 mg total) by mouth daily as needed.  [name] esomeprazole (NEXIUM) 40 MG capsule Take 40 mg by mouth daily.   . fenofibrate (TRICOR) 48 MG tablet Take 48 mg by mouth daily.  [name] gabapentin (NEURONTIN) 600 MG tablet Take 600 mg by mouth 3 (three) times daily.  [name] glucose blood (FREESTYLE TEST STRIPS) test strip USE TO CHECK BLOOD SUGARS DAILY. DX CODE E11.65  . HETLIOZ 20 MG CAPS Take 1 capsule by mouth at bedtime.  . INGREZZA 80 MG CAPS Take 80 mg by mouth at bedtime.  . Insulin Glargine (BASAGLAR KWIKPEN) 100 UNIT/ML SOPN Inject 0.22 mLs (22 Units total) into the skin daily.  [name] JARDIANCE 10 MG TABS tablet TAKE 1 TABLET BY MOUTH ONCE DAILY AT BREAKFAST.  [name] LINZESS 72 MCG capsule as needed.   [name] losartan (COZAAR) 25 MG tablet Take 1 tablet (25 mg total) by mouth daily.  . metFORMIN (GLUCOPHAGE-XR) 750 MG 24 hr tablet Take 750 mg by mouth 2 (two) times daily.  [name] oxyCODONE-acetaminophen (PERCOCET) 10-325 MG tablet   . pantoprazole (PROTONIX) 40 MG tablet   . promethazine (  PHENERGAN) 25 MG tablet Take 1 tablet (25 mg total) by mouth every 6 (six) hours as needed for  nausea or vomiting.  [name] QUEtiapine (SEROQUEL) 300 MG tablet Take 600 mg by mouth at bedtime. One hour before bed  . tiZANidine (ZANAFLEX) 4 MG tablet TAKE 1 TABLET BY ORAL ROUTE EVERY 8 HOURS AS NEEDED NOT TO EXCEED 3 DOSES IN 24 HOURS AS NEEDED  . VRAYLAR 6 MG CAPS Take 6 mg by mouth at bedtime.      Allergies:   Latex, Sweetening enhancer [flavoring agent], Ropinirole, Aspartame and phenylalanine, Ibuprofen, and Trazodone and nefazodone   Social History   Socioeconomic History  . Marital status: Married    Spouse name: Not on file  . Number of children: 2  . Years of education: 6th  . Highest education level: Not on file  Occupational History  . Not on file  Social Needs  . Financial resource strain: Not on file  . Food insecurity    Worry: Not on file    Inability: Not on file  . Transportation needs    Medical: Not on file    Non-medical: Not on file  Tobacco Use  . Smoking status: Current Every Day Smoker    Packs/day: 2.00    Years: 46.00    Pack years: 92.00    Types: Cigarettes  . Smokeless tobacco: Never Used  . Tobacco comment: per pt started smoking  age 14  Substance and Sexual Activity  . Alcohol use: Yes    Comment:  "special occasions only"  . Drug use: Not Currently  . Sexual activity: Yes  Lifestyle  . Physical activity    Days per week: Not on file    Minutes per session: Not on file  . Stress: Not on file  Relationships  . Social Herbalist on phone: Not on file    Gets together: Not on file    Attends religious service: Not on file    Active member of club or organization: Not on file    Attends meetings of clubs or organizations: Not on file    Relationship status: Not on file  Other Topics Concern  . Not on file  Social History Narrative   Patient is divorced and lives alone- independent in [place], Drives   Patient has two adult children- grown son and daughter who is special needs in a home   Patient is disabled since 63    Patient has a 6th grade education.   Patient is right-handed.   Depression-medication and therapist   Patient drinks 2- 3 liters of soda and drinks tea but not everyday.    Does not routinely exercise.      Patient reports that she drinks "a lot" of caffeine daily      Family History: The patient's family history includes Bladder Cancer in her paternal aunt; Cancer - Other in her brother, father, and mother; Diabetes in her mother; Heart attack in her father; Hypertension in her mother; Other in her cousin, father, and paternal uncle.  ROS:   Please see the history of present illness.     All other systems reviewed and are negative.  EKGs/Labs/Other Studies Reviewed:    The following studies were reviewed today:    Recent Labs: 05/10/2019: Hemoglobin 11.4; Platelets 435 08/22/2019: ALT 10; BUN 13; Creatinine, Ser 0.73; Potassium 4.0; Sodium 136  Recent Lipid Panel    Component Value Date/Time   CHOL 118 08/22/2019 1406  TRIG 207.0 (H) 08/22/2019 1406   HDL 37.00 (L) 08/22/2019 1406   CHOLHDL 3 08/22/2019 1406   VLDL 41.4 (H) 08/22/2019 1406   LDLCALC 37 04/12/2018 0553   LDLDIRECT 57.0 08/22/2019 1406    Physical Exam: Blood pressure 112/74, pulse 67, height 5\' 5"  (1.651 m), weight 174 lb 12.8 oz (79.3 kg), SpO2 97 %.  GEN: Middle-aged, chronically ill-appearing female, blunted affect. HEENT: Normal NECK: No JVD; No carotid bruits LYMPHATICS: No lymphadenopathy CARDIAC: RRR no significant murmurs RESPIRATORY:  [name] to auscultation without rales, wheezing or rhonchi  ABDOMEN: Soft, non-tender, non-distended MUSCULOSKELETAL: Status post right BKA. SKIN: Warm and dry NEUROLOGIC:  Alert and oriented x 3,      ECG : November 06, 2019: Normal sinus rhythm at 67 beats a minute.  Right right axis deviation.  Otherwise normal EKG.  ASSESSMENT:    1. Chronic systolic heart failure (HCC)    PLAN:    In order of problems listed above:  Palpitations: Palpitations  seem to be better on the bisoprolol.  Continue current medications.  2.  Chronic systolic congestive heart failure.  She is on bisoprolol and losartan.  She does not remember her exact doses.  She will call back to the office with the proper doses but I suspect she is on losartan 25 mg a day and bisoprolol 5 mg a day.  She is had an unremarkable heart catheterization by Dr. Marlou Porch many years ago.  Lexiscan Myoview study in 2015 was negative for ischemia.  3.  Peripheral vascular disease: she  used to smoke 4 packs of cigarettes a day.  She is cut down to 2 packs of cigarettes.  She is status post right BKA.  Once again strongly encouraged her to stop smoking.  She was worried it by about the extra cost of of nicotine patches.  I told her that this would be much cheaper than buying cigarettes.   She will return to see the APP in 6 months.     Medication Adjustments/Labs and Tests Ordered: Current medicines are reviewed at length with the patient today.  Concerns regarding medicines are outlined above.  Orders Placed This Encounter  Procedures  . EKG 12-Lead  . ECHOCARDIOGRAM COMPLETE   No orders of the defined types were placed in this encounter.    Patient Instructions  Medication Instructions:  Your provider recommends that you continue on your current medications as directed. Please refer to the Current Medication list given to you today.    Testing/Procedures: Your provider has requested that you have an echocardiogram. Echocardiography is a painless test that uses sound waves to create images of your heart. It provides your doctor with information about the size and shape of your heart and how well your heart's chambers and valves are working. This procedure takes approximately one hour. There are no restrictions for this procedure.  Follow-Up: Your provider recommends that you schedule a follow-up appointment in 3 months with Richardson Dopp.    Signed, Mertie Moores, MD   11/06/2019 10:31 AM    [place]

## 2019-11-06 NOTE — Telephone Encounter (Signed)
Per patient's appt today with Dr. Acie Fredrickson, patient is taking bisoprolol (ZEBETA) 5 MG tablet, she states she is taking 1 pill daily instead of half the pill daily. She is also taking losartan (COZAAR) 25 MG tablet as directed.

## 2019-11-06 NOTE — Patient Instructions (Signed)
Medication Instructions:  Your provider recommends that you continue on your current medications as directed. Please refer to the Current Medication list given to you today.    Testing/Procedures: Your provider has requested that you have an echocardiogram. Echocardiography is a painless test that uses sound waves to create images of your heart. It provides your doctor with information about the size and shape of your heart and how well your heart's chambers and valves are working. This procedure takes approximately one hour. There are no restrictions for this procedure.  Follow-Up: Your provider recommends that you schedule a follow-up appointment in 3 months with Richardson Dopp.

## 2019-11-07 NOTE — Telephone Encounter (Signed)
Noted. This is what Dr. Acie Fredrickson suspected that she was taking. Patient's medication record is correct.

## 2019-11-08 ENCOUNTER — Other Ambulatory Visit: Payer: Self-pay

## 2019-11-08 ENCOUNTER — Ambulatory Visit: Payer: Medicare Other | Admitting: Orthotics

## 2019-11-15 ENCOUNTER — Ambulatory Visit (HOSPITAL_COMMUNITY): Payer: Medicare Other | Attending: Cardiovascular Disease

## 2019-11-15 ENCOUNTER — Other Ambulatory Visit: Payer: Self-pay

## 2019-11-15 DIAGNOSIS — I5022 Chronic systolic (congestive) heart failure: Secondary | ICD-10-CM | POA: Diagnosis present

## 2019-11-20 ENCOUNTER — Ambulatory Visit: Payer: Medicare Other | Admitting: Orthotics

## 2019-11-20 ENCOUNTER — Other Ambulatory Visit: Payer: Self-pay

## 2019-11-20 DIAGNOSIS — Z89511 Acquired absence of right leg below knee: Secondary | ICD-10-CM

## 2019-11-20 DIAGNOSIS — Z89422 Acquired absence of other left toe(s): Secondary | ICD-10-CM

## 2019-11-25 NOTE — Progress Notes (Signed)
Patient can't wear shoes due to prvious hx of amputation; she really needs custom shoes.

## 2019-12-02 ENCOUNTER — Other Ambulatory Visit: Payer: Medicare Other | Admitting: Orthotics

## 2019-12-03 ENCOUNTER — Encounter: Payer: Self-pay | Admitting: Endocrinology

## 2019-12-03 ENCOUNTER — Ambulatory Visit (INDEPENDENT_AMBULATORY_CARE_PROVIDER_SITE_OTHER): Payer: Medicare Other | Admitting: Endocrinology

## 2019-12-03 VITALS — BP 140/80 | HR 85 | Ht 65.0 in | Wt 184.4 lb

## 2019-12-03 DIAGNOSIS — E1142 Type 2 diabetes mellitus with diabetic polyneuropathy: Secondary | ICD-10-CM | POA: Diagnosis not present

## 2019-12-03 DIAGNOSIS — Z794 Long term (current) use of insulin: Secondary | ICD-10-CM

## 2019-12-03 DIAGNOSIS — R635 Abnormal weight gain: Secondary | ICD-10-CM | POA: Diagnosis not present

## 2019-12-03 DIAGNOSIS — E1165 Type 2 diabetes mellitus with hyperglycemia: Secondary | ICD-10-CM

## 2019-12-03 LAB — POCT GLYCOSYLATED HEMOGLOBIN (HGB A1C): Hemoglobin A1C: 5.8 % — AB (ref 4.0–5.6)

## 2019-12-03 MED ORDER — JARDIANCE 25 MG PO TABS: 25.0000 mg | ORAL_TABLET | Freq: Every day | ORAL | 2 refills | Status: DC

## 2019-12-03 NOTE — Patient Instructions (Addendum)
Try Jardiance before supper, take 20mg  then next Rx can be 25mg   Basaglar 20 units and if am sugars are <90 then cut insulin to 16  Ask Psychiatrist about Cymbalta for neuropathy  Check blood sugars on waking up 7 days a week  Also check blood sugars about 2 hours after meals and do this after different meals by rotation  Recommended blood sugar levels on waking up are 90-130 and about 2 hours after meal is 130-160  Please bring your blood sugar monitor to each visit, thank you

## 2019-12-03 NOTE — Progress Notes (Signed)
Patient ID: [name], female   DOB: 1966-12-03, 53 y.o.   MRN: [id number]   Reason for Appointment: Diabetes follow-up    History of Present Illness:   Diagnosis: Type 2 diabetes mellitus, date of diagnosis: 2004.  PAST history: She has been treated mostly with insulin since about a year after diagnosis. She has had difficulty with consistent compliance with diet and also compliance with self care including glucose monitoring over the years. She had been mostly treated with basal insulin. Also had been tried on mealtime insulin but she would be noncompliant with this. Was tried on Prandin for mealtime control but difficult to judge efficacy because of lack of postprandial monitoring. Was given Victoza to start in 2011 but did not follow up after this.She was tried on premixed insulin but this did not help her control, mostly because of noncompliance with the doses. She had been using the V- go pump and had better control initially and was better compliant with the daily routine and boluses. She has had frequent education visits also. She stopped using her V.-go pump in 6/15 because of discomfort at the site of application, mostly was using this on her abdomen Also did not improve with a trial of Victoza   RECENT history:    INSULIN dose: Basaglar 22 units in am.  NovoLog 0 with meals  Non-insulin hypoglycemic drugs: Metformin ER 1500 mg daily, Jardiance 10 mg daily  Her A1c is 5.8, was 6.5   Current management, problems identified:   She previously was found to have falsely low readings with her freestyle libre system  Also A1c is lower than expected for her recent home blood sugar average of 128 on the freestyle libre  She was told to reduce her Basaglar to 18 units on her last visit but she did not do so  As before she is not making needs of her freestyle libre much and checking blood sugar only once a day on an average and data available only for 40% of the time   She still drinks [facility] in the morning and sometimes sweet drinks in the evenings  She does not want to take any NovoLog for fear of hypoglycemia  However difficult to know if her sugars are going up consistently in the evenings because of lack of monitoring, however most of the readings are around 180 at night  She takes her Jardiance at 8 PM instead of before her first meal of the day  She is concerned about gaining weight although previously had lost some after her surgery   Glucometer:  FreeStyle libre.   CGM use % of time  40  2-week average/SD  128, GV 36  Time in range     79   %  % Time Above 180  10  % Time above 250  3  % Time Below 70  8     PRE-MEAL Fasting Lunch Dinner  4-6 AM Overall  Glucose range:  94-132      Averages:  113   145  85  128   POST-MEAL PC Breakfast PC Lunch PC Dinner  Glucose range:     Averages:    196     PREVIOUS data:  CGM use % of time  37  2-week average/SD  88  Time in range        59 %  % Time Above 180  3  % Time above 250   % Time Below 70  38     PRE-MEAL Fasting Lunch Dinner Bedtime Overall  Glucose range:       Averages:  63  102  124 ?  163  88      Food preferences: eating Mostly 1 meal per day around 6-8 pm, variable intake, sandwiches at times or otherwise snacks,  Physical activity: exercise: Minimal.  Certified Diabetes Educator visit: Most recent:, 2/14.     Wt Readings from Last 3 Encounters:  12/03/19 184 lb 6.4 oz (83.6 kg)  11/06/19 174 lb 12.8 oz (79.3 kg)  09/03/19 167 lb 6.4 oz (75.9 kg)   DM labs:   Lab Results  Component Value Date   HGBA1C 5.8 (A) 12/03/2019   HGBA1C 6.5 08/22/2019   HGBA1C 9.2 (A) 01/08/2019   Lab Results  Component Value Date   MICROALBUR 1.1 01/08/2019   LDLCALC 37 04/12/2018   CREATININE 0.73 08/22/2019       Other active problems: See review of systems    Allergies as of 12/03/2019      Reactions   Latex Itching, Rash, Other (See Comments)   Pt  states she cannot use condoms - cause an infection.  Use of latex on skin is okay for short period of time.  Tape causes rash   Sweetening Enhancer [flavoring Agent] Nausea And Vomiting, Other (See Comments)   HEADACHES   Ropinirole    Pt stated, "I am getting cramps in my legs - especially at night"   Aspartame And Phenylalanine Nausea And Vomiting   HEADACHES   Ibuprofen Other (See Comments)   HEADACHES   Trazodone And Nefazodone Other (See Comments)   Hallucinations      Medication List       Accurate as of December 03, 2019  4:44 PM. If you have any questions, ask your nurse or doctor.        aspirin-acetaminophen-caffeine 250-250-65 MG tablet Commonly known as: EXCEDRIN MIGRAINE Take 2 tablets by mouth every 6 (six) hours as needed for headache.   atorvastatin 40 MG tablet Commonly known as: LIPITOR Take 40 mg by mouth at bedtime.   Basaglar KwikPen 100 UNIT/ML Sopn Inject 0.22 mLs (22 Units total) into the skin daily.   bisoprolol 5 MG tablet Commonly known as: ZEBETA Take 5 mg by mouth daily.   cyclobenzaprine 10 MG tablet Commonly known as: FLEXERIL TAKE 1 TABLET BY MOUTH THREE TIMES A DAY AS NEEDED FOR MUSCLE SPASMS   diazepam 5 MG tablet Commonly known as: VALIUM Take 5 mg by mouth 2 (two) times daily.   docusate sodium 100 MG capsule Commonly known as: Colace Take 1 capsule (100 mg total) by mouth daily as needed.   esomeprazole 40 MG capsule Commonly known as: NEXIUM Take 40 mg by mouth daily.   fenofibrate 48 MG tablet Commonly known as: TRICOR Take 48 mg by mouth daily.   FreeStyle Libre 14 Day Sensor Misc 2 each by Does not apply route every 14 (fourteen) days. Use 1 sensor every 14 days to monitor blood sugar.   gabapentin 600 MG tablet Commonly known as: NEURONTIN Take 600 mg by mouth 3 (three) times daily.   glucose blood test strip Commonly known as: FREESTYLE TEST STRIPS USE TO CHECK BLOOD SUGARS DAILY. DX CODE E11.65   Hetlioz 20  MG Caps Generic drug: Tasimelteon Take 1 capsule by mouth at bedtime.   Ingrezza 80 MG Caps Generic drug: Valbenazine Tosylate Take 80 mg by mouth at bedtime.   Jardiance 10 MG  Tabs tablet Generic drug: empagliflozin TAKE 1 TABLET BY MOUTH ONCE DAILY AT BREAKFAST.   Linzess 72 MCG capsule Generic drug: linaclotide as needed.   losartan 25 MG tablet Commonly known as: COZAAR Take 1 tablet (25 mg total) by mouth daily.   metFORMIN 750 MG 24 hr tablet Commonly known as: GLUCOPHAGE-XR Take 750 mg by mouth 2 (two) times daily.   oxyCODONE-acetaminophen 10-325 MG tablet Commonly known as: PERCOCET   pantoprazole 40 MG tablet Commonly known as: PROTONIX   promethazine 25 MG tablet Commonly known as: PHENERGAN Take 1 tablet (25 mg total) by mouth every 6 (six) hours as needed for nausea or vomiting.   QUEtiapine 300 MG tablet Commonly known as: SEROQUEL Take 600 mg by mouth at bedtime. One hour before bed   tiZANidine 4 MG tablet Commonly known as: ZANAFLEX TAKE 1 TABLET BY ORAL ROUTE EVERY 8 HOURS AS NEEDED NOT TO EXCEED 3 DOSES IN 24 HOURS AS NEEDED   Vraylar 6 MG Caps Generic drug: Cariprazine HCl Take 6 mg by mouth at bedtime.       Allergies:  Allergies  Allergen Reactions  . Latex Itching, Rash and Other (See Comments)    Pt states she cannot use condoms - cause an infection.  Use of latex on skin is okay for short period of time.  Tape causes rash  . Sweetening Enhancer [Flavoring Agent] Nausea And Vomiting and Other (See Comments)    HEADACHES  . Ropinirole     Pt stated, "I am getting cramps in my legs - especially at night"  . Aspartame And Phenylalanine Nausea And Vomiting    HEADACHES  . Ibuprofen Other (See Comments)    HEADACHES  . Trazodone And Nefazodone Other (See Comments)    Hallucinations     Past Medical History:  Diagnosis Date  . Anxiety   . Arthritis   . Bipolar disorder ([place])   . Broken jaw ([place])   . Broken wrist   . Chronic  back pain   . Claudication ([place])    a. 12/2013 ABI's: R 0.97, L 0.94.  [name] Colon cancer ([place])   . COPD (chronic obstructive pulmonary disease) ([place])   . Depression   . Diabetic Charcot's foot ([place])   . Diabetic foot ulcer (H[facility])    chronic left foot ulcer , great toe/  hx recurrent foot ulcer bilaterally  . DJD (degenerative joint disease)   . Edema of both lower extremities   . Episode of memory loss   . Family history of bladder cancer   . Family history of colon cancer   . Family history of kidney cancer   . GERD (gastroesophageal reflux disease)   . Headache(784.0)   . Hiatal hernia   . History of colon cancer, stage I dx 02-26-2015--- oncologist-- dr [name]--- per last note no recurrence   05-16-2015 s/p  Laparoscopic sigmoid colectomy w/ node bx's (negative nodes per path)  Stage I (pT1,N0,M0) Grade 2  . 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   per MRI multiple small strokes post cardiac cath. --  residual mild memory loss  . History of methicillin resistant staphylococcus aureus (MRSA)   . Hypercholesterolemia   . Hypertension   . Insomnia   . Insulin dependent type 2 diabetes mellitus, uncontrolled ([place]) dx 2004   endocrinologist-  dr Dwyane Dee-- last A1c 8.8 in Aug2018:  pt is noncompliant w/ diet, states does note eat breakfast , her first main meal  in afternoon  . Neurogenic bladder   . Neuropathy, diabetic ([place])    hands and feet  . OSA (obstructive sleep apnea)    cpap intolerant  . Personality disorder ([place])   . Restless leg syndrome   . SOB (shortness of breath) on exertion   . Stroke ([place])   . SUI (stress urinary incontinence, female) S/P SLING 12-29-2011    Past Surgical History:  Procedure Laterality Date  . AMPUTATION Right 04/13/2018   Procedure: RIGHT BELOW KNEE AMPUTATION;  Surgeon: Newt Minion, MD;  Location: [place];  Service: Orthopedics;  Laterality: Right;  . AMPUTATION Left 10/26/2018   Procedure: LEFT FOOT FIRST RAY  AMPUTATION;  Surgeon: Newt Minion, MD;  Location: [place];  Service: Orthopedics;  Laterality: Left;  . AMPUTATION TOE Left 05/10/2019   Procedure: AMPUTATION 2nd left TOE WITH METATARSAL;  Surgeon: Landis Martins, DPM;  Location: [place];  Service: Podiatry;  Laterality: Left;  . ANTERIOR CERVICAL DECOMP/DISCECTOMY FUSION  2000   C5 - 7  . APPLICATION OF WOUND VAC Left 10/26/2018   Procedure: APPLICATION OF WOUND VAC;  Surgeon: Newt Minion, MD;  Location: [place];  Service: Orthopedics;  Laterality: Left;  . BACK SURGERY    . CARDIAC CATHETERIZATION  05-22-2008   DR SKAINS   NO SIGNIFECANT CAD/ NORMAL LV/  EF 65%/  NO WALL MOTION ABNORMALITIES  . CARPAL TUNNEL RELEASE Right 04-25-2013  . CATARACT EXTRACTION Right 10/24/2018  . [place]; 1992  . COLON SURGERY    . COLONOSCOPY    . CYSTO N/A 04/30/2013   Procedure: CYSTOSCOPY;  Surgeon: Reece Packer, MD;  Location: [facility];  Service: Urology;  Laterality: N/A;  . CYSTOSCOPY MACROPLASTIQUE IMPLANT N/A 02/06/2018   Procedure: CYSTOSCOPY MACROPLASTIQUE IMPLANT;  Surgeon: Bjorn Loser, MD;  [facility];  Service: Urology;  Laterality: N/A;  . CYSTOSCOPY WITH INJECTION  05/04/2012   Procedure: CYSTOSCOPY WITH INJECTION;  Surgeon: Reece Packer, MD;  Location: [place];  Service: Urology;  Laterality: N/A;  MACROPLASTIQUE INJECTION  . CYSTOSCOPY WITH INJECTION  08/28/2012   Procedure: CYSTOSCOPY WITH INJECTION;  Surgeon: Reece Packer, MD;  Location: [facility];  Service: Urology;  Laterality: N/A;  cysto and macroplastique   . FOOT SURGERY Bilateral    "related to [place]"  . HERNIA REPAIR  ?1996   "stomach"  . I&D EXTREMITY Left 10/25/2018   Procedure: IRRIGATION AND DEBRIDEMENT GREAT TOE;  Surgeon: Meredith Pel, MD;  Location: [place];  Service: Orthopedics;  Laterality: Left;  . INCISION AND DRAINAGE Left 10/25/2018   GREAT TOE  . INCISION AND  DRAINAGE OF WOUND Right 04/11/2018   Procedure: IRRIGATION AND DEBRIDEMENT WOUND right foot and right ankle;  Surgeon: Evelina Bucy, DPM;  Location: [facility];  Service: Podiatry;  Laterality: Right;  . KNEE ARTHROSCOPY Left   . KNEE ARTHROSCOPY W/ ALLOGRAFT IMPANT Left    graft x 2  . KNEE SURGERY     TOTAL 8 SURG'S  . LAPAROSCOPIC SIGMOID COLECTOMY N/A 04/22/2015   Procedure: LAPAROSCOPIC HAND ASSISTED SIGMOID COLECTOMY;  Surgeon: Erroll Luna, MD;  Location: [place];  Service: General;  Laterality: N/A;  . LEFT HEART CATHETERIZATION WITH CORONARY ANGIOGRAM N/A 02/12/2014   Procedure: LEFT HEART CATHETERIZATION WITH CORONARY ANGIOGRAM;  Surgeon: Candee Furbish, MD;  Location: [facility];  Service: Cardiovascular;  Laterality: N/A;   No angiographically significant CAD; normal LVSF, LVEDP 74mmHg,  EF 55% (new finding ef 30% myoview 01-08-2014)  . LUMBAR FUSION    . MANDIBLE FRACTURE SURGERY    . MULTIPLE LAPAROSCOPIES FOR ENDOMETRIOSIS    . PUBOVAGINAL SLING  12/29/2011   Procedure: Gaynelle Arabian;  Surgeon: Reece Packer, MD;  Location: [facility];  Service: Urology;  Laterality: N/A;  cysto and sparc sling   . PUBOVAGINAL SLING N/A 04/30/2013   Procedure: REMOVAL OF VAGINAL MESH;  Surgeon: Reece Packer, MD;  Location: [facility];  Service: Urology;  Laterality: N/A;  . RECONSTURCTION OF CONGENITAL UTERUS ANOMALY  1983  . REPEAT RECONSTRUCTION ACL LEFT KNEE/ SCREWS REMOVED  03-28-2000   CADAVER GRAFT  . TOTAL ABDOMINAL HYSTERECTOMY  1997   w/BSO  . TRANSTHORACIC ECHOCARDIOGRAM  02/13/2014   ef 45%, hypokinesis base inferior and base inferolateral walls  . UPPER GI ENDOSCOPY    . WOUND DEBRIDEMENT Right 09/05/2016   Procedure: DEBRIDEMENT WOUND WITH GRAFT RIGHT FOOT;  Surgeon: Landis Martins, DPM;  Location: [place];  Service: Podiatry;  Laterality: Right;  . WRIST FRACTURE SURGERY      Family History  Problem Relation Age of Onset  . Hypertension Mother   . Diabetes  Mother   . Cancer - Other Mother        lymphoma   . Cancer - Other Father        lung, bladder cancer   . Heart attack Father   . Other Father        Lynch syndrome  . Cancer - Other Brother        bladder cancer   . Bladder Cancer Paternal Aunt        Lynch syndrome  . Other Paternal Uncle        Lynch syndrome  . Other Cousin        Lynch syndrome    Social History:  reports that she has been smoking cigarettes. She has a 92.00 pack-year smoking history. She has never used smokeless tobacco. She reports current alcohol use. She reports previous drug use.  Review of Systems -    She has history of high triglycerides previously treated with fenofibrate and is not on any treatment    Lab Results  Component Value Date   CHOL 118 08/22/2019   HDL 37.00 (L) 08/22/2019   LDLCALC 37 04/12/2018   LDLDIRECT 57.0 08/22/2019   TRIG 207.0 (H) 08/22/2019   CHOLHDL 3 08/22/2019     HYPOKALEMIA/hyponatremia: Resolved  Lab Results  Component Value Date   CREATININE 0.73 08/22/2019   BUN 13 08/22/2019   NA 136 08/22/2019   K 4.0 08/22/2019   CL 99 08/22/2019   CO2 29 08/22/2019    Painful neuropathy: She has had persistent symptoms including leg pains tingling and numbness as well as difficulty with balance  She has tried various drugs including gabapentin without much relief She is asking about alternatives but does not want Lyrica because of potential for weight gain Duloxetine has not apparently been discussed but currently is on another antidepressants from psychiatrist   Foot exams: Last showed absent pedal pulses and Absent distal sensation  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 Gershon Crane: Done in 1/19  HYPERTENSION: Mild and well controlled, followed by cardiologist, reportedly also has systolic heart failure with grade 2 diastolic dysfunction. Treated with low-dose losartan and also bisoprolol  Also on  Jardiance  BP Readings from Last 3 Encounters:  12/03/19 140/80  11/06/19 112/74  09/03/19 138/72        Examination:   BP 140/80 (BP Location: Left Arm, Patient Position: Sitting, Cuff Size: Normal)   Pulse 85   Ht 5\' 5"  (1.651 m)   Wt 184 lb 6.4 oz (83.6 kg)   SpO2 98%   BMI 30.69 kg/m   Body mass index is 30.69 kg/m.      Assesment/Plan:   Diabetes type 2, with persistently poor control, insulin requiring  See history of present illness for detailed discussion of current diabetes management, blood sugar patterns and problems identified  Her A1c is markedly improved at 5.8, was 6.5  Her blood sugars are being assessed by her freestyle libre but as above this is slightly reading falsely low Also she is checking her blood sugars only once a day and data available only for 40% of the time on her CGM Although she thinks she is concerned about hypoglycemia and will drink a regular [facility] every morning reassured her that her sugars are not actually low She has postprandial hyperglycemia but average blood sugar is mostly around 180 after supper when checked except on Thanksgiving   Recommendations:  Eliminate sweet drinks including [facility] and sweet tea completely  Check blood sugars at least on waking up, before dinner and bedtime and more often if able to with her freestyle Elenor Legato  We will check her labs today to compare her freestyle libre with the actual reading on her Elenor Legato  She can reduce Basaglar to 20 units for now  Increase Jardiance to 20 mg and then go to 25 mg prescription  This should hopefully help her with her heart failure diagnosis also  Advised her to follow-up with her podiatrist for her history of amputations, foot care and toenail trimming  NEUROPATHY: Still symptomatic despite using gabapentin  Again discussed Cymbalta, she will check with her psychiatrist about using duloxetine concomitantly with other antidepressants  HYPERTENSION:  Mild and followed by cardiologist Will need to check microalbumin also  Total visit time for evaluation and management of multiple problems and counseling =25 minutes  Patient Instructions  Try Jardiance before supper, take 20mg  then next Rx can be 25mg   Basaglar 20 units and if am sugars are <90 then cut insulin to 16  Ask Psychiatrist about Cymbalta for neuropathy  Check blood sugars on waking up 7 days a week  Also check blood sugars about 2 hours after meals and do this after different meals by rotation  Recommended blood sugar levels on waking up are 90-130 and about 2 hours after meal is 130-160  Please bring your blood sugar monitor to each visit, thank you       Counseling time on subjects discussed in assessment and plan sections is over 50% of today's 25 minute visit    Elayne Snare 12/03/2019, 4:44 PM

## 2019-12-03 NOTE — Addendum Note (Signed)
Addended by: Kaylyn Lim I on: 12/03/2019 05:38 PM   Modules accepted: Orders

## 2019-12-04 LAB — URINALYSIS, ROUTINE W REFLEX MICROSCOPIC
Bilirubin Urine: NEGATIVE
Ketones, ur: NEGATIVE
Nitrite: NEGATIVE
RBC / HPF: NONE SEEN (ref 0–?)
Specific Gravity, Urine: <=1.005 — AB (ref 1.000–1.030)
Total Protein, Urine: NEGATIVE
Urine Glucose: >=1000 — AB
Urobilinogen, UA: 0.2 (ref 0.0–1.0)
pH: 6 (ref 5.0–8.0)

## 2019-12-04 LAB — BASIC METABOLIC PANEL
BUN: 14 mg/dL (ref 6–23)
CO2: 27 mEq/L (ref 19–32)
Calcium: 9.3 mg/dL (ref 8.4–10.5)
Chloride: 96 mEq/L (ref 96–112)
Creatinine, Ser: 0.82 mg/dL (ref 0.40–1.20)
GFR: 72.5 mL/min (ref >60.00)
Glucose, Bld: 135 mg/dL — ABNORMAL HIGH (ref 70–99)
Potassium: 4.8 mEq/L (ref 3.5–5.1)
Sodium: 130 mEq/L — ABNORMAL LOW (ref 135–145)

## 2019-12-04 LAB — MICROALBUMIN / CREATININE URINE RATIO
Creatinine,U: 15.9 mg/dL
Microalb Creat Ratio: 4.4 mg/g (ref 0.0–30.0)
Microalb, Ur: <0.7 mg/dL (ref 0.0–1.9)

## 2019-12-04 LAB — TSH: TSH: 2.34 u[IU]/mL (ref 0.35–4.50)

## 2019-12-13 ENCOUNTER — Other Ambulatory Visit: Payer: Self-pay | Admitting: *Deleted

## 2019-12-13 MED ORDER — BISOPROLOL FUMARATE 5 MG PO TABS: 5.0000 mg | ORAL_TABLET | Freq: Every day | ORAL | 3 refills | Status: DC

## 2019-12-15 ENCOUNTER — Emergency Department (HOSPITAL_COMMUNITY)
Admission: EM | Admit: 2019-12-15 | Discharge: 2019-12-16 | Disposition: A | Discharge status: Home / Self Care | Payer: Medicare Other | Attending: Emergency Medicine | Admitting: Emergency Medicine

## 2019-12-15 ENCOUNTER — Encounter (HOSPITAL_COMMUNITY): Payer: Self-pay | Admitting: Emergency Medicine

## 2019-12-15 ENCOUNTER — Other Ambulatory Visit: Payer: Self-pay

## 2019-12-15 DIAGNOSIS — I10 Essential (primary) hypertension: Secondary | ICD-10-CM | POA: Insufficient documentation

## 2019-12-15 DIAGNOSIS — J449 Chronic obstructive pulmonary disease, unspecified: Secondary | ICD-10-CM | POA: Insufficient documentation

## 2019-12-15 DIAGNOSIS — K5900 Constipation, unspecified: Secondary | ICD-10-CM | POA: Diagnosis present

## 2019-12-15 DIAGNOSIS — N390 Urinary tract infection, site not specified: Secondary | ICD-10-CM

## 2019-12-15 DIAGNOSIS — E1140 Type 2 diabetes mellitus with diabetic neuropathy, unspecified: Secondary | ICD-10-CM | POA: Insufficient documentation

## 2019-12-15 DIAGNOSIS — Z85038 Personal history of other malignant neoplasm of large intestine: Secondary | ICD-10-CM | POA: Diagnosis not present

## 2019-12-15 DIAGNOSIS — R1110 Vomiting, unspecified: Secondary | ICD-10-CM | POA: Diagnosis not present

## 2019-12-15 DIAGNOSIS — F17210 Nicotine dependence, cigarettes, uncomplicated: Secondary | ICD-10-CM | POA: Diagnosis not present

## 2019-12-15 DIAGNOSIS — Z89511 Acquired absence of right leg below knee: Secondary | ICD-10-CM | POA: Diagnosis not present

## 2019-12-15 DIAGNOSIS — Z79899 Other long term (current) drug therapy: Secondary | ICD-10-CM | POA: Diagnosis not present

## 2019-12-15 DIAGNOSIS — Z7984 Long term (current) use of oral hypoglycemic drugs: Secondary | ICD-10-CM | POA: Insufficient documentation

## 2019-12-15 DIAGNOSIS — Z8673 Personal history of transient ischemic attack (TIA), and cerebral infarction without residual deficits: Secondary | ICD-10-CM | POA: Diagnosis not present

## 2019-12-15 NOTE — ED Triage Notes (Signed)
Pt reports she has not had a BM since around thanksgiving, also endorses some nausea. States she is drinking po fluids okay but has been scared to eat much. Reports she has taken a total of 9 linzess and done some laxatives over the course of the last few weeks without any relief. Hx of colon cancer a few years ago, reports having 17 inches of colon removed.

## 2019-12-16 ENCOUNTER — Emergency Department (HOSPITAL_COMMUNITY): Payer: Medicare Other

## 2019-12-16 DIAGNOSIS — K5900 Constipation, unspecified: Secondary | ICD-10-CM | POA: Diagnosis not present

## 2019-12-16 LAB — COMPREHENSIVE METABOLIC PANEL
ALT: 15 U/L (ref 0–44)
AST: 18 U/L (ref 15–41)
Albumin: 4 g/dL (ref 3.5–5.0)
Alkaline Phosphatase: 55 U/L (ref 38–126)
Anion gap: 11 (ref 5–15)
BUN: 8 mg/dL (ref 6–20)
CO2: 23 mmol/L (ref 22–32)
Calcium: 9.3 mg/dL (ref 8.9–10.3)
Chloride: 102 mmol/L (ref 98–111)
Creatinine, Ser: 0.76 mg/dL (ref 0.44–1.00)
GFR calc Af Amer: >60 mL/min (ref >60)
GFR calc non Af Amer: >60 mL/min (ref >60)
Glucose, Bld: 125 mg/dL — ABNORMAL HIGH (ref 70–99)
Potassium: 3.8 mmol/L (ref 3.5–5.1)
Sodium: 136 mmol/L (ref 135–145)
Total Bilirubin: 0.5 mg/dL (ref 0.3–1.2)
Total Protein: 7 g/dL (ref 6.5–8.1)

## 2019-12-16 LAB — I-STAT BETA HCG BLOOD, ED (MC, WL, AP ONLY): I-stat hCG, quantitative: 5.3 m[IU]/mL — ABNORMAL HIGH (ref <5)

## 2019-12-16 LAB — URINALYSIS, ROUTINE W REFLEX MICROSCOPIC
Bilirubin Urine: NEGATIVE
Glucose, UA: >=500 mg/dL — AB
Hgb urine dipstick: NEGATIVE
Ketones, ur: NEGATIVE mg/dL
Nitrite: NEGATIVE
Protein, ur: NEGATIVE mg/dL
Specific Gravity, Urine: 1.021 (ref 1.005–1.030)
pH: 6 (ref 5.0–8.0)

## 2019-12-16 LAB — CBC
HCT: 38.6 % (ref 36.0–46.0)
Hemoglobin: 13.1 g/dL (ref 12.0–15.0)
MCH: 30.8 pg (ref 26.0–34.0)
MCHC: 33.9 g/dL (ref 30.0–36.0)
MCV: 90.6 fL (ref 80.0–100.0)
Platelets: 290 10*3/uL (ref 150–400)
RBC: 4.26 MIL/uL (ref 3.87–5.11)
RDW: 12.4 % (ref 11.5–15.5)
WBC: 11.8 10*3/uL — ABNORMAL HIGH (ref 4.0–10.5)
nRBC: 0.4 % — ABNORMAL HIGH (ref 0.0–0.2)

## 2019-12-16 LAB — LIPASE, BLOOD: Lipase: 97 U/L — ABNORMAL HIGH (ref 11–51)

## 2019-12-16 MED ORDER — MILK AND MOLASSES ENEMA
1.0000 | Freq: Once | RECTAL | Status: AC
  Administered 2019-12-16: 240 mL via RECTAL
  Filled 2019-12-16: qty 240

## 2019-12-16 MED ORDER — POLYETHYLENE GLYCOL 3350 17 G PO PACK: 17.0000 g | PACK | Freq: Every day | ORAL | 0 refills | Status: AC

## 2019-12-16 MED ORDER — FENTANYL CITRATE (PF) 100 MCG/2ML IJ SOLN
75.0000 ug | Freq: Once | INTRAMUSCULAR | Status: AC
  Administered 2019-12-16: 75 ug via INTRAVENOUS
  Filled 2019-12-16: qty 2

## 2019-12-16 MED ORDER — SODIUM CHLORIDE 0.9 % IV BOLUS
1000.0000 mL | Freq: Once | INTRAVENOUS | Status: AC
  Administered 2019-12-16: 1000 mL via INTRAVENOUS

## 2019-12-16 MED ORDER — IOHEXOL 300 MG/ML  SOLN
100.0000 mL | Freq: Once | INTRAMUSCULAR | Status: AC | PRN
  Administered 2019-12-16: 100 mL via INTRAVENOUS

## 2019-12-16 MED ORDER — ONDANSETRON HCL 4 MG/2ML IJ SOLN
4.0000 mg | Freq: Once | INTRAMUSCULAR | Status: AC
  Administered 2019-12-16: 4 mg via INTRAVENOUS
  Filled 2019-12-16: qty 2

## 2019-12-16 MED ORDER — CEPHALEXIN 500 MG PO CAPS: 500.0000 mg | ORAL_CAPSULE | Freq: Four times a day (QID) | ORAL | 0 refills | Status: DC

## 2019-12-16 NOTE — ED Notes (Signed)
Patient had a very large BM, Pt. Stated it felt like a [employer] came out of her.

## 2019-12-16 NOTE — Discharge Instructions (Addendum)
Thank you for allowing me to care for you today in the Emergency Department.   You were seen today for constipation, abdominal pain, and vomiting.  Your work-up demonstrated severe constipation and a UTI.  You were given a molasses enema in the ER for constipation.  Make sure that you are drinking at least 64 ounces of water daily.  Mix 1 packet of MiraLAX and 8 to 16 ounces of water or orange juice daily until you have regular, soft bowel movements.  Call to schedule a follow-up appoint with gastroenterology.  Do not take Linzess more than prescribed even if you are constipated.  Take 1 tablet of Keflex every 6 hours for the next 5 days for UTI.  Return to the emergency department if you develop persistent vomiting despite taking nausea medicine, high fevers and abdominal pain, if you become constipated again and stop passing gas, or other new, concerning symptoms.

## 2019-12-16 NOTE — ED Provider Notes (Signed)
53yo female with constipation, waiting for a molasses enema, if successful- may dc home with referral to GI. If not relief, bisacodyl for home, also tx for UTI. Physical Exam  BP 126/76   Pulse 84   Temp 97.9 F (36.6 C) (Oral)   Resp 16   SpO2 99%   Physical Exam  ED Course/Procedures   Clinical Course as of Dec 15 852  Mon Dec 16, 2019  0317 Spoke with Lennette Bihari with [facility] regarding Linzess ingestion.  Labs and vital signs were reviewed.  No further observation.  Or work-up regarding Linzess ingestion is indicated at this time.   [MM]    Clinical Course User Index [MM] McDonald, Mia A, PA-C    Procedures  MDM  Patient reports feeling much improved, passed large amount of stool. Patient ready for dc.      Tacy Learn, PA-C 12/16/19 [id number]    Carmin Muskrat, MD 12/16/19 [phone]

## 2019-12-16 NOTE — ED Provider Notes (Signed)
[facility] EMERGENCY DEPARTMENT Provider Note   CSN: [id number] Arrival date & time: 12/15/19  2331     History Chief Complaint  Patient presents with  . Constipation    [name] is a 53 y.o. 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  The patient reports that she has not had a bowel movement since Thanksgiving.  She reports that it is not uncommon for her to go over a week without passing any stool, but typically she does not go this long between bowel movements.  She reports that she stopped passing flatus approximately a week ago and has had vomiting for the last week.  Notes that emesis today was watery and brown in color, but did not have a foul odor.  Reports that she attempted to treat her symptoms at home by taking 9 of her home 72 mcg Linzess between last night and this morning.  Reports that she is also taken several other laxatives without passing any stool.  She is reporting subjective fever and and diaphoresis, but denies chills, dysuria, hematuria, hematemesis, melena, hematochezia, cough, shortness of breath, or chest pain.  The history is provided by the patient. No language interpreter was used.       Past Medical History:  Diagnosis Date  . Anxiety   . Arthritis   . Bipolar disorder ([place])   . Broken jaw ([place])   . Broken wrist   . Chronic back pain   . Claudication ([place])    a. 12/2013 ABI's: R 0.97, L 0.94.  [name] Colon cancer ([place])   . COPD (chronic obstructive pulmonary disease) ([place])   . Depression   . Diabetic Charcot's foot ([place])   . Diabetic foot ulcer (HCC)    chronic left foot ulcer , great toe/  hx recurrent foot ulcer bilaterally  . DJD (degenerative joint disease)   . Edema of both lower extremities   . Episode of memory loss    . Family history of bladder cancer   . Family history of colon cancer   . Family history of kidney cancer   . GERD (gastroesophageal reflux disease)   . Headache(784.0)   . Hiatal hernia   . History of colon cancer, stage I dx 02-26-2015--- oncologist-- dr [name]--- per last note no recurrence   05-16-2015 s/p  Laparoscopic sigmoid colectomy w/ node bx's (negative nodes per path)  Stage I (pT1,N0,M0) Grade 2  . 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   per MRI multiple small strokes post cardiac cath. --  residual mild memory loss  . History of methicillin resistant staphylococcus aureus (MRSA)   . Hypercholesterolemia   . Hypertension   . Insomnia   . Insulin dependent type 2 diabetes mellitus, uncontrolled ([place]) dx 2004   endocrinologist-  dr Dwyane Dee-- last A1c 8.8 in Aug2018:  pt is noncompliant w/ diet, states does note eat breakfast , her first main meal in afternoon  . Neurogenic bladder   . Neuropathy, diabetic ([place])    hands and feet  . OSA (obstructive sleep apnea)    cpap intolerant  . Personality disorder ([place])   . Restless leg syndrome   . SOB (shortness of breath) on exertion   . Stroke ([place])   . SUI (stress urinary incontinence, female) S/P SLING  12-29-2011    Patient Active Problem List   Diagnosis Date Noted  . Genetic testing 03/12/2019  . Family history of colon cancer   . Family history of bladder cancer   . Family history of kidney cancer   . Postmenopausal bleeding 11/15/2018  . Dog bite of foot, sequela   . Left foot infection 10/25/2018  . Gangrenous disorder ([place]) 09/17/2018  . Cellulitis of left upper extremity   . Poorly controlled diabetes mellitus ([place])   . UTI (urinary tract infection) 05/23/2018  . Acute metabolic encephalopathy 14/43/1540  . GERD (gastroesophageal reflux disease) 05/09/2018  . Tobacco abuse 05/09/2018  . Polypharmacy 05/09/2018  . Restless leg syndrome 04/27/2018  . Anxiety 04/27/2018  .  Hyponatremia 04/26/2018  . Noncompliance with dietary restriction 04/26/2018  . Postoperative cellulitis of surgical wound 04/26/2018  . At high risk for falls 04/24/2018  . Noncompliance with safety precautions 04/24/2018  . Leukocytosis 04/24/2018  . 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 PAD (peripheral artery disease) ([place])   . Poorly controlled type 2 diabetes mellitus ([place])   . Subacute osteomyelitis, right ankle and foot ([place])   . Major depressive disorder, recurrent episode ([place]) 04/11/2018  . Diabetic ulcer of right midfoot associated with type 2 diabetes mellitus, with necrosis of bone ([place])   . Abscess of ankle   . Infectious synovitis   . Diabetic foot infection ([place])   . Sepsis ([place]) 04/09/2018  . Melanocytic nevus 02/28/2018  . Lesion of labia 09/27/2017  . Vaginitis and vulvovaginitis 02/19/2016  . Cancer of sigmoid colon ([place]) 03/09/2015  . History of stroke within last year 02/12/2015  . OSA (obstructive sleep apnea) 07/04/2014  . Migraine without aura, with intractable migraine, so stated, without mention of status migrainosus 03/26/2014  . Peripheral neuropathy 03/03/2014  . Abnormal stress test 02/12/2014  . CVA (cerebral infarction) 02/12/2014  . Chest pain   . Abnormal heart rhythm   . Edema   . Hypertension   . Hyperlipemia   . Hypercholesterolemia   . Type II diabetes mellitus, uncontrolled ([place]) 07/22/2013  . Diabetic neuropathy with neurologic complication ([place]) 08/67/6195  . Diabetic polyneuropathy ([place]) 07/22/2013    Past Surgical History:  Procedure Laterality Date  . AMPUTATION Right 04/13/2018   Procedure: RIGHT BELOW KNEE AMPUTATION;  Surgeon: Newt Minion, MD;  Location: [facility];  Service: Orthopedics;  Laterality: Right;  . AMPUTATION Left 10/26/2018   Procedure: LEFT FOOT FIRST RAY AMPUTATION;  Surgeon: Newt Minion, MD;  Location: [facility];  Service: Orthopedics;  Laterality: Left;  . AMPUTATION TOE Left 05/10/2019   Procedure:  AMPUTATION 2nd left TOE WITH METATARSAL;  Surgeon: Landis Martins, DPM;  Location: [facility];  Service: Podiatry;  Laterality: Left;  . ANTERIOR CERVICAL DECOMP/DISCECTOMY FUSION  2000   C5 - 7  . APPLICATION OF WOUND VAC Left 10/26/2018   Procedure: APPLICATION OF WOUND VAC;  Surgeon: Newt Minion, MD;  Location: [facility];  Service: Orthopedics;  Laterality: Left;  . BACK SURGERY    . CARDIAC CATHETERIZATION  05-22-2008   DR SKAINS   NO SIGNIFECANT CAD/ NORMAL LV/  EF 65%/  NO WALL MOTION ABNORMALITIES  . CARPAL TUNNEL RELEASE Right 04-25-2013  . CATARACT EXTRACTION Right 10/24/2018  . [place]; 1992  . COLON SURGERY    . COLONOSCOPY    . CYSTO N/A 04/30/2013   Procedure: CYSTOSCOPY;  Surgeon: Reece Packer, MD;  [facility];  Service: Urology;  Laterality:  N/A;  . Arnetha Courser IMPLANT N/A 02/06/2018   Procedure: CYSTOSCOPY MACROPLASTIQUE IMPLANT;  Surgeon: Bjorn Loser, MD;  [facility];  Service: Urology;  Laterality: N/A;  . CYSTOSCOPY WITH INJECTION  05/04/2012   Procedure: CYSTOSCOPY WITH INJECTION;  Surgeon: Reece Packer, MD;  Location: [facility];  Service: Urology;  Laterality: N/A;  MACROPLASTIQUE INJECTION  . CYSTOSCOPY WITH INJECTION  08/28/2012   Procedure: CYSTOSCOPY WITH INJECTION;  Surgeon: Reece Packer, MD;  Location: [facility];  Service: Urology;  Laterality: N/A;  cysto and macroplastique   . FOOT SURGERY Bilateral    "related to Chacots"  . HERNIA REPAIR  ?1996   "stomach"  . I & D EXTREMITY Left 10/25/2018   Procedure: IRRIGATION AND DEBRIDEMENT GREAT TOE;  Surgeon: Meredith Pel, MD;  Location: [facility];  Service: Orthopedics;  Laterality: Left;  . INCISION AND DRAINAGE Left 10/25/2018   GREAT TOE  . INCISION AND DRAINAGE OF WOUND Right 04/11/2018   Procedure: IRRIGATION AND DEBRIDEMENT WOUND right foot and right ankle;  Surgeon: Evelina Bucy, DPM;  [facility];  Service: Podiatry;  Laterality: Right;  . KNEE ARTHROSCOPY Left   . KNEE ARTHROSCOPY W/ ALLOGRAFT IMPANT Left    graft x 2  . KNEE SURGERY     TOTAL 8 SURG'S  . LAPAROSCOPIC SIGMOID COLECTOMY N/A 04/22/2015   Procedure: LAPAROSCOPIC HAND ASSISTED SIGMOID COLECTOMY;  Surgeon: Erroll Luna, MD;  Location: [facility];  Service: General;  Laterality: N/A;  . LEFT HEART CATHETERIZATION WITH CORONARY ANGIOGRAM N/A 02/12/2014   Procedure: LEFT HEART CATHETERIZATION WITH CORONARY ANGIOGRAM;  Surgeon: Candee Furbish, MD;  Location: [facility] C[facility];  Service: Cardiovascular;  Laterality: N/A;   No angiographically significant CAD; normal LVSF, LVEDP 67mmHg,  EF 55% (new finding ef 30% myoview 01-08-2014)  . LUMBAR FUSION    . MANDIBLE FRACTURE SURGERY    . MULTIPLE LAPAROSCOPIES FOR ENDOMETRIOSIS    . PUBOVAGINAL SLING  12/29/2011   Procedure: Gaynelle Arabian;  Surgeon: Reece Packer, MD;  Location: [facility];  Service: Urology;  Laterality: N/A;  cysto and sparc sling   . PUBOVAGINAL SLING N/A 04/30/2013   Procedure: REMOVAL OF VAGINAL MESH;  Surgeon: Reece Packer, MD;  [facility];  Service: Urology;  Laterality: N/A;  . RECONSTURCTION OF CONGENITAL UTERUS ANOMALY  1983  . REPEAT RECONSTRUCTION ACL LEFT KNEE/ SCREWS REMOVED  03-28-2000   CADAVER GRAFT  . TOTAL ABDOMINAL HYSTERECTOMY  1997   w/BSO  . TRANSTHORACIC ECHOCARDIOGRAM  02/13/2014   ef 45%, hypokinesis base inferior and base inferolateral walls  . UPPER GI ENDOSCOPY    . WOUND DEBRIDEMENT Right 09/05/2016   Procedure: DEBRIDEMENT WOUND WITH GRAFT RIGHT FOOT;  Surgeon: Landis Martins, DPM;  Location: [facility];  Service: Podiatry;  Laterality: Right;  . WRIST FRACTURE SURGERY       OB History   No obstetric history on file.     Family History  Problem Relation Age of Onset  . Hypertension Mother   . Diabetes Mother   . Cancer - Other Mother        lymphoma   . Cancer - Other Father        lung, bladder  cancer   . Heart attack Father   . Other Father        Lynch syndrome  . Cancer - Other Brother        bladder cancer   .  Bladder Cancer Paternal Aunt        Lynch syndrome  . Other Paternal Uncle        Lynch syndrome  . Other Cousin        Lynch syndrome    Social History   Tobacco Use  . Smoking status: Current Every Day Smoker    Packs/day: 2.00    Years: 46.00    Pack years: 92.00    Types: Cigarettes  . Smokeless tobacco: Never Used  . Tobacco comment: per pt started smoking  age 45  Substance Use Topics  . Alcohol use: Yes    Comment:  "special occasions only"  . Drug use: Not Currently    Home Medications Prior to Admission medications   Medication Sig Start Date End Date Taking? Authorizing Provider  aspirin-acetaminophen-caffeine (EXCEDRIN MIGRAINE) [phone] MG tablet Take 2 tablets by mouth every 6 (six) hours as needed for headache.    [provider]  atorvastatin (LIPITOR) 40 MG tablet Take 40 mg by mouth at bedtime. 09/30/19   [provider]  bisoprolol (ZEBETA) 5 MG tablet Take 1 tablet (5 mg total) by mouth daily. 12/13/19   Nahser, Wonda Cheng, MD  cephALEXin (KEFLEX) 500 MG capsule Take 1 capsule (500 mg total) by mouth 4 (four) times daily. 12/16/19   Marvetta Vohs A, PA-C  Continuous Blood Gluc Sensor (FREESTYLE LIBRE 14 DAY SENSOR) MISC 2 each by Does not apply route every 14 (fourteen) days. Use 1 sensor every 14 days to monitor blood sugar. 08/14/19   Elayne Snare, MD  cyclobenzaprine (FLEXERIL) 10 MG tablet TAKE 1 TABLET BY MOUTH THREE TIMES A DAY AS NEEDED FOR MUSCLE SPASMS 06/10/19   Landis Martins, DPM  diazepam (VALIUM) 5 MG tablet Take 5 mg by mouth 2 (two) times daily.  10/07/18   [provider]  docusate sodium (COLACE) 100 MG capsule Take 1 capsule (100 mg total) by mouth daily as needed. 05/10/19 05/09/20  Landis Martins, DPM  empagliflozin (JARDIANCE) 25 MG TABS tablet Take 25 mg by mouth daily before breakfast. Finish  10 mg dose using 2 tablets daily and then start 25 mg 12/03/19   Elayne Snare, MD  esomeprazole (NEXIUM) 40 MG capsule Take 40 mg by mouth daily.  10/07/18   [provider]  fenofibrate (TRICOR) 48 MG tablet Take 48 mg by mouth daily. 07/15/19   [provider]  gabapentin (NEURONTIN) 600 MG tablet Take 600 mg by mouth 3 (three) times daily.    [provider]  glucose blood (FREESTYLE TEST STRIPS) test strip USE TO CHECK BLOOD SUGARS DAILY. DX CODE E11.65 08/09/18   Elayne Snare, MD  HETLIOZ 20 MG CAPS Take 1 capsule by mouth at bedtime. 10/23/19   [provider]  INGREZZA 80 MG CAPS Take 80 mg by mouth at bedtime. 05/01/18   [provider]  Insulin Glargine (BASAGLAR KWIKPEN) 100 UNIT/ML SOPN Inject 0.22 mLs (22 Units total) into the skin daily. 07/05/18   Lorella Nimrod, MD  LINZESS 72 MCG capsule as needed.  05/15/19   [provider]  losartan (COZAAR) 25 MG tablet Take 1 tablet (25 mg total) by mouth daily. 07/12/19   Nahser, Wonda Cheng, MD  metFORMIN (GLUCOPHAGE-XR) 750 MG 24 hr tablet Take 750 mg by mouth 2 (two) times daily. 03/21/19   [provider]  oxyCODONE-acetaminophen (PERCOCET) 10-325 MG tablet  05/18/19   [provider]  pantoprazole (PROTONIX) 40 MG tablet  05/07/19   [provider]  polyethylene glycol (MIRALAX) 17 g packet Take 17 g by mouth daily for 14 days. 12/16/19 12/30/19  Acel Natzke A, PA-C  promethazine (PHENERGAN) 25 MG tablet Take 1 tablet (25 mg total) by mouth every 6 (six) hours as needed for nausea or vomiting. 05/10/19   Landis Martins, DPM  QUEtiapine (SEROQUEL) 300 MG tablet Take 600 mg by mouth at bedtime. One hour before bed 06/12/18   [provider]  tiZANidine (ZANAFLEX) 4 MG tablet TAKE 1 TABLET BY ORAL ROUTE EVERY 8 HOURS AS NEEDED NOT TO EXCEED 3 DOSES IN 24 HOURS AS NEEDED 07/23/19   Stover, Titorya, DPM  VRAYLAR 6 MG CAPS Take 6 mg by mouth at bedtime.  05/31/18   [provider]    Allergies    Latex, Sweetening enhancer [flavoring agent], Ropinirole, Aspartame and phenylalanine, Ibuprofen, and Trazodone and nefazodone  Review of Systems   Review of Systems  Constitutional: Positive for diaphoresis and fever. Negative for activity change and chills.  HENT: Negative for congestion, sinus pain and sore throat.   Respiratory: Negative for shortness of breath and wheezing.   Cardiovascular: Negative for chest pain and palpitations.  Gastrointestinal: Positive for abdominal pain, constipation, nausea and vomiting. Negative for anal bleeding, blood in stool, diarrhea and rectal pain.  Genitourinary: Negative for dysuria, flank pain, frequency, hematuria, pelvic pain, urgency, vaginal bleeding and vaginal discharge.  Musculoskeletal: Negative for back pain.  Skin: Negative for rash.  Allergic/Immunologic: Negative for immunocompromised state.  Neurological: Negative for dizziness, seizures, syncope, weakness, numbness and headaches.  Psychiatric/Behavioral: Negative for confusion.    Physical Exam Updated Vital Signs BP 99/60 (BP Location: Right Arm)   Pulse 84   Temp 97.9 F (36.6 C) (Oral)   Resp 16   SpO2 99%   Physical Exam Vitals and nursing note reviewed.  Constitutional:      General: She is not in acute distress.    Appearance: She is not ill-appearing or toxic-appearing.     Comments: Appears older than stated age  HENT:     Head: Normocephalic.  Eyes:     Conjunctiva/sclera: Conjunctivae normal.  Cardiovascular:     Rate and Rhythm: Normal rate and regular rhythm.     Heart sounds: No murmur. No friction rub. No gallop.   Pulmonary:     Effort: Pulmonary effort is normal. No respiratory distress.     Breath sounds: No stridor. No wheezing, rhonchi or rales.  Chest:     Chest wall: No tenderness.  Abdominal:     Palpations: Abdomen is soft.     Tenderness: There is abdominal tenderness.     Comments: Abdomen is obese and  nondistended.  Maximal tenderness is in the left lower quadrant and right upper quadrant.  There is no rebound or guarding.  Negative Murphy sign.  No CVA tenderness bilaterally.  No tenderness over McBurney's point.  Musculoskeletal:     Cervical back: Neck supple.  Skin:    General: Skin is warm.     Findings: No rash.  Neurological:     Mental Status: She is alert.  Psychiatric:        Behavior: Behavior normal.     ED Results / Procedures / Treatments   Labs (all labs ordered are listed, but only abnormal results are displayed) Labs Reviewed  LIPASE, BLOOD - Abnormal; Notable for the following components:      Result Value   Lipase 97 (*)    All other components  within normal limits  COMPREHENSIVE METABOLIC PANEL - Abnormal; Notable for the following components:   Glucose, Bld 125 (*)    All other components within normal limits  CBC - Abnormal; Notable for the following components:   WBC 11.8 (*)    nRBC 0.4 (*)    All other components within normal limits  URINALYSIS, ROUTINE W REFLEX MICROSCOPIC - Abnormal; Notable for the following components:   Color, Urine STRAW (*)    Glucose, UA >=500 (*)    Leukocytes,Ua MODERATE (*)    Bacteria, UA RARE (*)    All other components within normal limits  I-STAT BETA HCG BLOOD, ED ([facility], [facility], [facility] ONLY) - Abnormal; Notable for the following components:   I-stat hCG, quantitative 5.3 (*)    All other components within normal limits  URINE CULTURE    EKG None  Radiology CT ABDOMEN PELVIS W CONTRAST  Result Date: 12/16/2019 CLINICAL DATA:  Bowel obstruction suspected. Patient reports no bowel movement since Thanksgiving (more than 3 weeks) EXAM: CT ABDOMEN AND PELVIS WITH CONTRAST TECHNIQUE: Multidetector CT imaging of the abdomen and pelvis was performed using the standard protocol following bolus administration of intravenous contrast. CONTRAST:  16mL OMNIPAQUE IOHEXOL 300 MG/ML  SOLN COMPARISON:  Abdominopelvic CT 05/04/2017  FINDINGS: Lower chest: Lung bases are clear. Hepatobiliary: Mild hepatic steatosis with focal fatty infiltration adjacent to the falciform ligament. Gallbladder is nondistended. No calcified gallstone. No biliary dilatation. Pancreas: No ductal dilatation or inflammation. Spleen: Normal in size without focal abnormality. Small splenule at the hilum. Adrenals/Urinary Tract: No adrenal nodule. Mild prominence of the bilateral renal collecting systems and ureters without frank hydronephrosis. No perinephric edema. Symmetric but slightly delayed excretion from both kidneys. No focal renal abnormality. Mild bladder distention without wall thickening. Stomach/Bowel: Stomach partially distended. No gastric wall thickening. No small bowel obstruction or inflammatory change. Normal appendix. Large colonic stool burden. Large volume of stool throughout every colonic segment. Chain suture noted in the sigmoid colon without associated focal bowel wall thickening or abnormality. Stool ball distends the rectum with rectal distention of 6.2 cm. No focal narrowing or evidence of stricture or mass. No pericolonic edema or colonic wall thickening. Vascular/Lymphatic: Aortic atherosclerosis. No aneurysm. Patent portal vein. No enlarged lymph nodes in the abdomen or pelvis. Reproductive: Status post hysterectomy. No adnexal masses. Other: No free air, free fluid, or intra-abdominal fluid collection. Musculoskeletal: Postsurgical change at L4-L5. There are no acute or suspicious osseous abnormalities. IMPRESSION: 1. Large volume of stool throughout the entire colon without evidence of obstruction, stricture, or colonic mass. Stool distends the rectum with rectal distention of 6.2 cm. Findings consistent with constipation. Chain suture in the sigmoid colon without associated colonic abnormality. 2. Mild prominence of the bilateral renal collecting systems and ureters without frank hydronephrosis or perinephric edema. Recommend  correlation with urinalysis to exclude urinary tract infection. 3. Mild hepatic steatosis. Aortic Atherosclerosis (ICD10-I70.0). Electronically Signed   By: Keith Rake M.D.   On: 12/16/2019 04:15    Procedures Procedures (including critical care time)  Medications Ordered in ED Medications  fentaNYL (SUBLIMAZE) injection 75 mcg (75 mcg Intravenous Given 12/16/19 0313)  ondansetron (ZOFRAN) injection 4 mg (4 mg Intravenous Given 12/16/19 0313)  sodium chloride 0.9 % bolus 1,000 mL (0 mLs Intravenous Stopped 12/16/19 0713)  iohexol (OMNIPAQUE) 300 MG/ML solution 100 mL (100 mLs Intravenous Contrast Given 12/16/19 0351)  milk and molasses enema (240 mLs Rectal Given 12/16/19 6761)    ED Course  I have reviewed  the triage vital signs and the nursing notes.  Pertinent labs & imaging results that were available during my care of the patient were reviewed by me and considered in my medical decision making (see chart for details).  Clinical Course as of Dec 15 734  Mon Dec 16, 2019  4888 Spoke with Lennette Bihari with [facility] poison control regarding Linzess ingestion.  Labs and vital signs were reviewed.  No further observation.  Or work-up regarding Linzess ingestion is indicated at this time.   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  She has not had a bowel movement in over a month.  She stopped passing flatus 1 week ago.  The patient was discussed with Dr. Roxanne Mins, attending physician.  Vital signs are unremarkable.  She is endorsing subjective fever, but is afebrile in the ER.  She is maximally tender to palpation in the right upper quadrant and left lower quadrant on exam.   Given her history of sigmoid colon cancer and surgical resection, she is at high risk for a bowel obstruction.  Will order CT abdomen pelvis.  She also notes that she took 9 of her home Linzess in the last 24 hours.  Spoke with poison control and reviewed labs and vital signs and no further observation or work-up is indicated regarding ingestion at this time.  Labs are notable for mildly elevated lipase.  No metabolic derangements.  She does not clinically appear dehydrated.  However, will order IV fluids as well as antiemetics and pain medication.  I-STAT hCG was minimally elevated at 5.3, suspect false positive as the patient has previously had total abdominal hysterectomy.  CT abdomen pelvis with large volume of stool throughout the entire colon, but no evidence of obstruction, stricture, or colonic mass.  There is distention of the rectum.  No evidence of impaction.  There is also mild prominence of the bilateral renal collecting systems and ureter without frank hydronephrosis or perinephric edema.  Urinalysis with pyuria.  Will send urine for culture and empirically treat the patient with Keflex.  Shared decision-making conversation with the patient.  Given the length of time that she has been constipated, she is agreeable to receiving an enema in the ER.  Milk molasses enema has been ordered.  The patient can be discharged home after receiving enema, even if she does not receive a bowel movement.  She has a large bowel movement in the ER, can discharge home with MiraLAX and GI follow-up.  If not, I would recommend starting the patient on MiraLAX, GoLYTELY, bisacodyl suppositories then following up with GI.  Patient care transferred to [place] at the end of my shift. Patient presentation, ED course, and plan of care discussed with review of all pertinent labs and imaging. Please see his/her note for further details regarding further ED course and disposition.  Final Clinical Impression(s) / ED  Diagnoses Final diagnoses:  Constipation, unspecified constipation type  Urinary tract infection in female    Rx / DC Orders ED Discharge Orders         Ordered    cephALEXin (KEFLEX) 500 MG capsule  4 times  daily     12/16/19 0729    polyethylene glycol (MIRALAX) 17 g packet  Daily     12/16/19 0729           Joanne Gavel, PA-C 74/12/87 [id number]    Delora Fuel, MD 72/09/47 [phone]

## 2019-12-17 LAB — URINE CULTURE: Culture: <10000 — AB

## 2020-01-01 ENCOUNTER — Other Ambulatory Visit: Payer: Self-pay

## 2020-01-01 MED ORDER — BD PEN NEEDLE NANO 2ND GEN 32G X 4 MM MISC: 1.0000 | Freq: Every day | 1 refills | Status: AC

## 2020-01-01 MED ORDER — BASAGLAR KWIKPEN 100 UNIT/ML ~~LOC~~ SOPN: 22.0000 [IU] | PEN_INJECTOR | Freq: Every day | SUBCUTANEOUS | 3 refills | Status: DC

## 2020-01-06 ENCOUNTER — Other Ambulatory Visit: Payer: Self-pay | Admitting: Endocrinology

## 2020-01-20 ENCOUNTER — Ambulatory Visit (INDEPENDENT_AMBULATORY_CARE_PROVIDER_SITE_OTHER): Payer: Medicare Other | Admitting: Orthopedic Surgery

## 2020-01-20 ENCOUNTER — Ambulatory Visit: Payer: Self-pay

## 2020-01-20 ENCOUNTER — Other Ambulatory Visit: Payer: Self-pay

## 2020-01-20 ENCOUNTER — Ambulatory Visit (INDEPENDENT_AMBULATORY_CARE_PROVIDER_SITE_OTHER): Payer: Medicare Other

## 2020-01-20 ENCOUNTER — Encounter: Payer: Self-pay | Admitting: Orthopedic Surgery

## 2020-01-20 VITALS — Ht 65.0 in | Wt 184.0 lb

## 2020-01-20 DIAGNOSIS — G8929 Other chronic pain: Secondary | ICD-10-CM

## 2020-01-20 DIAGNOSIS — M25561 Pain in right knee: Secondary | ICD-10-CM

## 2020-01-20 DIAGNOSIS — M25562 Pain in left knee: Secondary | ICD-10-CM | POA: Diagnosis not present

## 2020-01-20 NOTE — Progress Notes (Signed)
Office Visit Note   Patient: [name]           Date of Birth: 07-26-1966           MRN: [id number] Visit Date: 01/20/2020              Requested by: Tamsen Roers, [place],  [place] [address] PCP: Tamsen Roers, MD  Chief Complaint  Patient presents with  . Left Knee - Pain  . Right Knee - Pain      HPI: The patient comes in today for 2 issues.  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  She has having difficulty wearing her prosthetic  On the left knee she is complaining of pain across the knee joint especially medially she has had several surgeries to her recount 8 surgeries on this left knee it now awakens her at night and she is being given Percocet to control the pain  Assessment & Plan: Visit Diagnoses:  1. Chronic pain of both knees     Plan: The first step will be for her to obtain a new socket that is properly fitting.  She will follow-up in 2 months at that time if she is doing well on the right and her prosthetic is fitting well we could plan and discuss left knee replacement.  Follow-Up Instructions: No follow-ups on file.   Ortho Exam  Patient is alert, oriented, no adenopathy, well-dressed, normal affect, normal respiratory effort. Right below-knee amputation on the right no surrounding cellulitis well-healed surgical incision she has had some muscle atrophy causing more prominence of the tibia.  There is no skin breakdown.  On the left knee she has an anterior surgical scar which is well-healed no effusion mild soft tissue swelling she is particularly painful on the medial joint line  Imaging: No results found. No images are attached to the encounter.  Labs: Lab Results  Component Value Date   HGBA1C 5.8 (A) 12/03/2019   HGBA1C 6.5 08/22/2019   HGBA1C 9.2 (A) 01/08/2019   ESRSEDRATE 55 (H) 02/19/2016   CRP 17.4 (H) 02/19/2016   LABURIC 5.8 02/19/2016   REPTSTATUS 12/17/2019 FINAL 12/16/2019   GRAMSTAIN  05/10/2019    NO WBC SEEN NO ORGANISMS SEEN Performed at [facility], [place] [address] [place], [place] [address]    CULT (A) 12/16/2019    <10,000 COLONIES/mL INSIGNIFICANT GROWTH Performed at [facility] [address] [place],  [address]    [place] 05/10/2019   LABORGA STREPTOCOCCUS ANGINOSIS 05/10/2019     Lab Results  Component Value Date   ALBUMIN 4.0 12/15/2019   ALBUMIN 3.8 08/22/2019   ALBUMIN 3.6 02/25/2019   LABURIC 5.8 02/19/2016    Lab Results  Component Value Date   MG 1.8 04/13/2018   MG 1.7 04/12/2018   MG 2.0 10/09/2017   No results found for: VD25OH  No results found for: PREALBUMIN CBC EXTENDED Latest Ref Rng & Units 12/15/2019 05/10/2019 02/25/2019  WBC 4.0 - 10.5 K/uL 11.8(H) 9.6 11.9(H)  RBC 3.87 - 5.11 MIL/uL 4.26 3.79(L) 4.70  HGB 12.0 - 15.0 g/dL 13.1 11.4(L) 14.1  HCT 36.0 - 46.0 % 38.6 34.2(L)  43.6  PLT 150 - 400 K/uL 290 435(H) 264  NEUTROABS 1.7 - 7.7 K/uL - - 8.5(H)  LYMPHSABS 0.7 - 4.0 K/uL - - 2.7     Body mass index is 30.62 kg/m.  Orders:  Orders Placed This Encounter  Procedures  . XR Knee 1-2 Views Right  . XR Knee 1-2 Views Left   No orders of the defined types were placed in this encounter.    Procedures: No procedures performed  Clinical Data: No additional findings.  ROS:  All other systems negative, except as noted in the HPI. Review of Systems  Objective: Vital Signs: Ht 5\' 5"  (1.651 m)   Wt 184 lb (83.5 kg)   BMI 30.62 kg/m   Specialty Comments:  No specialty comments available.  PMFS History: Patient Active Problem List   Diagnosis Date Noted  . Genetic testing 03/12/2019  . Family history of colon cancer   . Family history of bladder  cancer   . Family history of kidney cancer   . Postmenopausal bleeding 11/15/2018  . Dog bite of foot, sequela   . Left foot infection 10/25/2018  . Gangrenous disorder ([place]) 09/17/2018  . Cellulitis of left upper extremity   . Poorly controlled diabetes mellitus ([place])   . UTI (urinary tract infection) 05/23/2018  . Acute metabolic encephalopathy 16/09/9603  . GERD (gastroesophageal reflux disease) 05/09/2018  . Tobacco abuse 05/09/2018  . Polypharmacy 05/09/2018  . Restless leg syndrome 04/27/2018  . Anxiety 04/27/2018  . Hyponatremia 04/26/2018  . Noncompliance with dietary restriction 04/26/2018  . Postoperative cellulitis of surgical wound 04/26/2018  . At high risk for falls 04/24/2018  . Noncompliance with safety precautions 04/24/2018  . Leukocytosis 04/24/2018  . 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 PAD (peripheral artery disease) ([place])   . Poorly controlled type 2 diabetes mellitus ([place])   . Subacute osteomyelitis, right ankle and foot ([place])   . Major depressive disorder, recurrent episode ([place]) 04/11/2018  . Diabetic ulcer of right midfoot associated with type 2 diabetes mellitus, with necrosis of bone ([place])   . Abscess of ankle   . Infectious synovitis   . Diabetic foot infection ([place])   . Sepsis ([place]) 04/09/2018  . Melanocytic nevus 02/28/2018  . Lesion of labia 09/27/2017  . Vaginitis and vulvovaginitis 02/19/2016  . Cancer of sigmoid colon ([place]) 03/09/2015  . History of stroke within last year 02/12/2015  . OSA (obstructive sleep apnea) 07/04/2014  . Migraine without aura, with intractable migraine, so stated, without mention of status migrainosus 03/26/2014  . Peripheral neuropathy 03/03/2014  . Abnormal stress test 02/12/2014  . CVA (cerebral infarction) 02/12/2014  . Chest pain   . Abnormal heart rhythm   . Edema   . Hypertension   . Hyperlipemia   . Hypercholesterolemia   . Type II diabetes mellitus, uncontrolled ([place]) 07/22/2013  . Diabetic  neuropathy with neurologic complication ([place]) 54/08/8118  . Diabetic polyneuropathy ([place]) 07/22/2013   Past Medical History:  Diagnosis Date  . Anxiety   . Arthritis   . Bipolar disorder ([place])   . Broken jaw ([place])   . Broken wrist   . Chronic back pain   . Claudication ([place])    a. 12/2013 ABI's: R 0.97, L 0.94.  [name] Colon cancer ([place])   . COPD (chronic obstructive pulmonary disease) ([place])   . Depression   . Diabetic Charcot's foot ([place])   . Diabetic foot ulcer ([place])    chronic left foot ulcer , great toe/  hx recurrent foot ulcer bilaterally  . DJD (degenerative joint disease)   . Edema of both lower extremities   . Episode of memory loss   . Family history of bladder cancer   . Family history of colon cancer   . Family history of kidney cancer   . GERD (gastroesophageal reflux disease)   . Headache(784.0)   . Hiatal hernia   . History of colon cancer, stage I dx 02-26-2015--- oncologist-- dr [name]--- per last note no recurrence   05-16-2015 s/p  Laparoscopic sigmoid colectomy w/ node bx's (negative nodes per path)  Stage I (pT1,N0,M0) Grade 2  . 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   per MRI multiple small strokes post cardiac cath. --  residual mild memory loss  . History of methicillin resistant staphylococcus aureus (MRSA)   . Hypercholesterolemia   . Hypertension   . Insomnia   . Insulin dependent type 2 diabetes mellitus, uncontrolled ([place]) dx 2004   endocrinologist-  dr Dwyane Dee-- last A1c 8.8 in Aug2018:  pt is noncompliant w/ diet, states does note eat breakfast , her first main meal in afternoon  . Neurogenic bladder   . Neuropathy, diabetic ([place])    hands and feet  . OSA (obstructive sleep apnea)    cpap intolerant  . Personality disorder ([place])   . Restless leg syndrome   . SOB (shortness of breath) on exertion   . Stroke ([place])   . SUI (stress urinary incontinence, female) S/P SLING 12-29-2011    Family History  Problem Relation  Age of Onset  . Hypertension Mother   . Diabetes Mother   . Cancer - Other Mother        lymphoma   . Cancer - Other Father        lung, bladder cancer   . Heart attack Father   . Other Father        Lynch syndrome  . Cancer - Other Brother        bladder cancer   . Bladder Cancer Paternal Aunt        Lynch syndrome  . Other Paternal Uncle        Lynch syndrome  . Other Cousin        Lynch syndrome    Past Surgical History:  Procedure Laterality Date  . AMPUTATION Right 04/13/2018   Procedure: RIGHT BELOW KNEE AMPUTATION;  Surgeon: Newt Minion, MD;  Location: [facility];  Service: Orthopedics;  Laterality: Right;  . AMPUTATION Left 10/26/2018   Procedure: LEFT FOOT FIRST RAY AMPUTATION;  Surgeon: Newt Minion, MD;  Location: [facility];  Service: Orthopedics;  Laterality: Left;  . AMPUTATION TOE Left 05/10/2019   Procedure: AMPUTATION 2nd left TOE WITH METATARSAL;  Surgeon: Landis Martins, DPM;  Location: [facility];  Service: Podiatry;  Laterality: Left;  . ANTERIOR CERVICAL DECOMP/DISCECTOMY FUSION  2000   C5 - 7  . APPLICATION OF WOUND VAC Left 10/26/2018   Procedure: APPLICATION OF WOUND VAC;  Surgeon: Newt Minion, MD;  Location: [facility];  Service: Orthopedics;  Laterality: Left;  . BACK SURGERY    . CARDIAC CATHETERIZATION  05-22-2008   DR SKAINS   NO SIGNIFECANT CAD/ NORMAL LV/  EF 65%/  NO WALL MOTION ABNORMALITIES  . CARPAL TUNNEL RELEASE Right 04-25-2013  . CATARACT EXTRACTION Right 10/24/2018  . [place]; 1992  . COLON SURGERY    . COLONOSCOPY    . CYSTO N/A  04/30/2013   Procedure: CYSTOSCOPY;  Surgeon: Reece Packer, MD;  Location: [facility];  Service: Urology;  Laterality: N/A;  . CYSTOSCOPY MACROPLASTIQUE IMPLANT N/A 02/06/2018   Procedure: CYSTOSCOPY MACROPLASTIQUE IMPLANT;  Surgeon: Bjorn Loser, MD;  Location: [facility];  Service: Urology;  Laterality: N/A;  . CYSTOSCOPY WITH INJECTION  05/04/2012   Procedure: CYSTOSCOPY WITH INJECTION;   Surgeon: Reece Packer, MD;  Location: [facility];  Service: Urology;  Laterality: N/A;  MACROPLASTIQUE INJECTION  . CYSTOSCOPY WITH INJECTION  08/28/2012   Procedure: CYSTOSCOPY WITH INJECTION;  Surgeon: Reece Packer, MD;  Location: [facility];  Service: Urology;  Laterality: N/A;  cysto and macroplastique   . FOOT SURGERY Bilateral    "related to [place]"  . HERNIA REPAIR  ?1996   "stomach"  . I & D EXTREMITY Left 10/25/2018   Procedure: IRRIGATION AND DEBRIDEMENT GREAT TOE;  Surgeon: Meredith Pel, MD;  Location: [facility];  Service: Orthopedics;  Laterality: Left;  . INCISION AND DRAINAGE Left 10/25/2018   GREAT TOE  . INCISION AND DRAINAGE OF WOUND Right 04/11/2018   Procedure: IRRIGATION AND DEBRIDEMENT WOUND right foot and right ankle;  Surgeon: Evelina Bucy, DPM;  Location: [facility];  Service: Podiatry;  Laterality: Right;  . KNEE ARTHROSCOPY Left   . KNEE ARTHROSCOPY W/ ALLOGRAFT IMPANT Left    graft x 2  . KNEE SURGERY     TOTAL 8 SURG'S  . LAPAROSCOPIC SIGMOID COLECTOMY N/A 04/22/2015   Procedure: LAPAROSCOPIC HAND ASSISTED SIGMOID COLECTOMY;  Surgeon: Erroll Luna, MD;  Location: [facility];  Service: General;  Laterality: N/A;  . LEFT HEART CATHETERIZATION WITH CORONARY ANGIOGRAM N/A 02/12/2014   Procedure: LEFT HEART CATHETERIZATION WITH CORONARY ANGIOGRAM;  Surgeon: Candee Furbish, MD;  [facility];  Service: Cardiovascular;  Laterality: N/A;   No angiographically significant CAD; normal LVSF, LVEDP 67mmHg,  EF 55% (new finding ef 30% myoview 01-08-2014)  . LUMBAR FUSION    . MANDIBLE FRACTURE SURGERY    . MULTIPLE LAPAROSCOPIES FOR ENDOMETRIOSIS    . PUBOVAGINAL SLING  12/29/2011   Procedure: Gaynelle Arabian;  Surgeon: Reece Packer, MD;  Location: [facility];  Service: Urology;  Laterality: N/A;  cysto and sparc sling   . PUBOVAGINAL SLING N/A 04/30/2013   Procedure: REMOVAL OF VAGINAL MESH;  Surgeon: Reece Packer, MD;  [facility];  Service: Urology;  Laterality: N/A;  . RECONSTURCTION OF CONGENITAL UTERUS ANOMALY  1983  . REPEAT RECONSTRUCTION ACL LEFT KNEE/ SCREWS REMOVED  03-28-2000   CADAVER GRAFT  . TOTAL ABDOMINAL HYSTERECTOMY  1997   w/BSO  . TRANSTHORACIC ECHOCARDIOGRAM  02/13/2014   ef 45%, hypokinesis base inferior and base inferolateral walls  . UPPER GI ENDOSCOPY    . WOUND DEBRIDEMENT Right 09/05/2016   Procedure: DEBRIDEMENT WOUND WITH GRAFT RIGHT FOOT;  Surgeon: Landis Martins, DPM;  Location: [facility];  Service: Podiatry;  Laterality: Right;  . WRIST FRACTURE SURGERY     Social History   Occupational History  . Not on file  Tobacco Use  . Smoking status: Current Every Day Smoker    Packs/day: 2.00    Years: 46.00    Pack years: 92.00    Types: Cigarettes  . Smokeless tobacco: Never Used  . Tobacco comment: per pt started smoking  age 46  Substance and Sexual Activity  . Alcohol use: Yes    Comment:  "special occasions only"  .  Drug use: Not Currently  . Sexual activity: Yes

## 2020-01-23 ENCOUNTER — Telehealth: Payer: Self-pay | Admitting: *Deleted

## 2020-01-23 NOTE — Telephone Encounter (Signed)
Pt states she is trying to get an appt with Dr. Cannon Kettle the same time she can pick up her shoe with a toe filler.

## 2020-01-30 NOTE — Telephone Encounter (Signed)
Pt scheduled to see Liliane Channel to be cast for custom shoes/missed last appt 12.7.2020 and see Dr Cannon Kettle to follow up on 2.9.2021.Marland KitchenMarland Kitchen

## 2020-02-04 ENCOUNTER — Ambulatory Visit (INDEPENDENT_AMBULATORY_CARE_PROVIDER_SITE_OTHER): Payer: Medicare Other | Admitting: Sports Medicine

## 2020-02-04 ENCOUNTER — Encounter: Payer: Self-pay | Admitting: Sports Medicine

## 2020-02-04 ENCOUNTER — Other Ambulatory Visit: Payer: Medicare Other | Admitting: Orthotics

## 2020-02-04 ENCOUNTER — Other Ambulatory Visit: Payer: Self-pay

## 2020-02-04 DIAGNOSIS — Z89422 Acquired absence of other left toe(s): Secondary | ICD-10-CM

## 2020-02-04 DIAGNOSIS — Z89511 Acquired absence of right leg below knee: Secondary | ICD-10-CM

## 2020-02-04 DIAGNOSIS — E1142 Type 2 diabetes mellitus with diabetic polyneuropathy: Secondary | ICD-10-CM

## 2020-02-04 NOTE — Progress Notes (Signed)
Subjective: [name] is a 54 y.o. female patient seen today in office for POV # 8 (DOS 05-10-19), S/P left second toe amputation.  Patient denies pain at surgical site and reports that she is doing good and was seen by [facility] for custom shoe on left. Reports that the requip no longer helps with her pain and her PCP started her on another nerve pain gets sharp pain randomly in the left foot and right stump site.  No other issues noted.   Patient Active Problem List   Diagnosis Date Noted  . Genetic testing 03/12/2019  . Family history of colon cancer   . Family history of bladder cancer   . Family history of kidney cancer   . Postmenopausal bleeding 11/15/2018  . Dog bite of foot, sequela   . Left foot infection 10/25/2018  . Gangrenous disorder ([place]) 09/17/2018  . Cellulitis of left upper extremity   . Poorly controlled diabetes mellitus ([place])   . UTI (urinary tract infection) 05/23/2018  . Acute metabolic encephalopathy 63/14/9702  . GERD (gastroesophageal reflux disease) 05/09/2018  . Tobacco abuse 05/09/2018  . Polypharmacy 05/09/2018  . Restless leg syndrome 04/27/2018  . Anxiety 04/27/2018  . Hyponatremia 04/26/2018  . Noncompliance with dietary restriction 04/26/2018  . Postoperative cellulitis of surgical wound 04/26/2018  . At high risk for falls 04/24/2018  . Noncompliance with safety precautions 04/24/2018  . Leukocytosis 04/24/2018  . Unilateral complete BKA ([place]) 04/16/2018  . PAD (peripheral artery disease) ([place])   . Poorly controlled type 2 diabetes mellitus ([place])   . Subacute osteomyelitis, right ankle and foot ([place])   . Major depressive disorder, recurrent episode ([place]) 04/11/2018  . Diabetic ulcer of right midfoot associated with type 2 diabetes mellitus, with necrosis of bone ([place])   . Abscess of ankle   . Infectious synovitis   . Diabetic foot infection ([place])   . Sepsis ([place]) 04/09/2018  . Melanocytic nevus 02/28/2018  . Lesion of labia 09/27/2017  .  Vaginitis and vulvovaginitis 02/19/2016  . Cancer of sigmoid colon ([place]) 03/09/2015  . History of stroke within last year 02/12/2015  . OSA (obstructive sleep apnea) 07/04/2014  . Migraine without aura, with intractable migraine, so stated, without mention of status migrainosus 03/26/2014  . Peripheral neuropathy 03/03/2014  . Abnormal stress test 02/12/2014  . CVA (cerebral infarction) 02/12/2014  . Chest pain   . Abnormal heart rhythm   . Edema   . Hypertension   . Hyperlipemia   . Hypercholesterolemia   . Type II diabetes mellitus, uncontrolled ([place]) 07/22/2013  . Diabetic neuropathy with neurologic complication ([place]) 63/78/5885  . Diabetic polyneuropathy ([place]) 07/22/2013    Current Outpatient Medications on File Prior to Visit  Medication Sig Dispense Refill  . aspirin-acetaminophen-caffeine (EXCEDRIN MIGRAINE) 250-250-65 MG tablet Take 2 tablets by mouth every 6 (six) hours as needed for headache.    [name] atorvastatin (LIPITOR) 40 MG tablet Take 40 mg by mouth at bedtime.    . bisoprolol (ZEBETA) 5 MG tablet Take 1 tablet (5 mg total) by mouth daily. 90 tablet 3  . cephALEXin (KEFLEX) 500 MG capsule Take 1 capsule (500 mg total) by mouth 4 (four) times daily. 20 capsule 0  . Continuous Blood Gluc Sensor (FREESTYLE LIBRE 14 DAY SENSOR) MISC 2 each by Does not apply route every 14 (fourteen) days. Use 1 sensor every 14 days to monitor blood sugar. 2 each 0  . cyclobenzaprine (FLEXERIL) 10 MG tablet TAKE 1 TABLET BY MOUTH THREE  TIMES A DAY AS NEEDED FOR MUSCLE SPASMS 30 tablet 0  . diazepam (VALIUM) 5 MG tablet Take 5 mg by mouth 2 (two) times daily.     [name] docusate sodium (COLACE) 100 MG capsule Take 1 capsule (100 mg total) by mouth daily as needed. 30 capsule 2  . esomeprazole (NEXIUM) 40 MG capsule Take 40 mg by mouth daily.     . fenofibrate (TRICOR) 48 MG tablet Take 48 mg by mouth daily.    [name] gabapentin (NEURONTIN) 600 MG tablet Take 600 mg by mouth 3 (three) times daily.    [name]  glucose blood (FREESTYLE TEST STRIPS) test strip USE TO CHECK BLOOD SUGARS DAILY. DX CODE E11.65 50 each 3  . HETLIOZ 20 MG CAPS Take 1 capsule by mouth at bedtime.    . INGREZZA 80 MG CAPS Take 80 mg by mouth at bedtime.    . Insulin Glargine (BASAGLAR KWIKPEN) 100 UNIT/ML SOPN Inject 0.22 mLs (22 Units total) into the skin daily. 15 mL 3  . Insulin Pen Needle (BD PEN NEEDLE NANO 2ND GEN) 32G X 4 MM MISC 1 each by Does not apply route daily. Use to inject insulin daily. 100 each 1  . JARDIANCE 25 MG TABS tablet TAKE 1 TAB BY MOUTH DAILY BEFORE BREAKFAST. FINSIH 10MG  DOSE USING 2 TABS DAILY THEN START 25MG  30 tablet 2  . LINZESS 72 MCG capsule as needed.     [name] losartan (COZAAR) 25 MG tablet Take 1 tablet (25 mg total) by mouth daily. 90 tablet 3  . metFORMIN (GLUCOPHAGE-XR) 750 MG 24 hr tablet Take 750 mg by mouth 2 (two) times daily.    [name] oxyCODONE-acetaminophen (PERCOCET) 10-325 MG tablet     . pantoprazole (PROTONIX) 40 MG tablet     . promethazine (PHENERGAN) 25 MG tablet Take 1 tablet (25 mg total) by mouth every 6 (six) hours as needed for nausea or vomiting. 30 tablet 0  . QUEtiapine (SEROQUEL) 300 MG tablet Take 600 mg by mouth at bedtime. One hour before bed  1  . tiZANidine (ZANAFLEX) 4 MG tablet TAKE 1 TABLET BY ORAL ROUTE EVERY 8 HOURS AS NEEDED NOT TO EXCEED 3 DOSES IN 24 HOURS AS NEEDED 30 tablet 0  . VRAYLAR 6 MG CAPS Take 6 mg by mouth at bedtime.   1   No current facility-administered medications on file prior to visit.    Allergies  Allergen Reactions  . Latex Itching, Rash and Other (See Comments)    Pt states she cannot use condoms - cause an infection.  Use of latex on skin is okay for short period of time.  Tape causes rash  . Sweetening Enhancer [Flavoring Agent] Nausea And Vomiting and Other (See Comments)    HEADACHES  . Ropinirole     Pt stated, "I am getting cramps in my legs - especially at night"  . Aspartame And Phenylalanine Nausea And Vomiting    HEADACHES   . Ibuprofen Other (See Comments)    HEADACHES  . Trazodone And Nefazodone Other (See Comments)    Hallucinations     Objective: There were no vitals filed for this visit.  General: No acute distress, AAOx3  Left foot.  Nails x3 midly elongated, surgical incision site well-healed left foot, no erythema, no warmth, no drainage, no other signs of infection noted, Capillary fill time <3 seconds in all remaining digits, gross sensation absent via light touch to left foot.  No pain at the calf.  Patient  is status post left first and second toe digital amputations and status post right BKA.  Assessment and Plan:  Problem List Items Addressed This Visit      Endocrine   Diabetic polyneuropathy ([place])    Other Visit Diagnoses    S/P amputation of lesser toe, left ([place])    -  Primary   Status post below knee amputation, right ([place])           -Patient seen and evaluated -Left well healed -At no charge debrided nails x 3 on left foot using sterile nail nipper without incident -Dispensed new post op shoe to use meanwhile on left foot -Patient awaiting custom shoe for the left -Continue with neuropathy meds as rx by Dr. Rex Kras -Return to office when custom shoe is here for dispensing or sooner if problems or issues arise Landis Martins, DPM

## 2020-03-05 ENCOUNTER — Other Ambulatory Visit: Payer: Self-pay

## 2020-03-06 ENCOUNTER — Ambulatory Visit: Payer: Self-pay | Admitting: Hematology

## 2020-03-06 ENCOUNTER — Inpatient Hospital Stay: Payer: Medicare Other | Attending: Physician Assistant

## 2020-03-06 ENCOUNTER — Encounter: Payer: Self-pay | Admitting: Physician Assistant

## 2020-03-06 ENCOUNTER — Other Ambulatory Visit: Payer: Self-pay

## 2020-03-06 ENCOUNTER — Inpatient Hospital Stay (HOSPITAL_BASED_OUTPATIENT_CLINIC_OR_DEPARTMENT_OTHER): Payer: Medicare Other | Admitting: Physician Assistant

## 2020-03-06 VITALS — BP 168/63 | HR 71 | Temp 98.7°F | Resp 18 | Ht 65.0 in | Wt 187.2 lb

## 2020-03-06 DIAGNOSIS — I10 Essential (primary) hypertension: Secondary | ICD-10-CM

## 2020-03-06 DIAGNOSIS — E1140 Type 2 diabetes mellitus with diabetic neuropathy, unspecified: Secondary | ICD-10-CM | POA: Diagnosis not present

## 2020-03-06 DIAGNOSIS — C187 Malignant neoplasm of sigmoid colon: Secondary | ICD-10-CM

## 2020-03-06 DIAGNOSIS — Z87891 Personal history of nicotine dependence: Secondary | ICD-10-CM | POA: Insufficient documentation

## 2020-03-06 DIAGNOSIS — Z8673 Personal history of transient ischemic attack (TIA), and cerebral infarction without residual deficits: Secondary | ICD-10-CM | POA: Insufficient documentation

## 2020-03-06 DIAGNOSIS — J449 Chronic obstructive pulmonary disease, unspecified: Secondary | ICD-10-CM | POA: Insufficient documentation

## 2020-03-06 DIAGNOSIS — F319 Bipolar disorder, unspecified: Secondary | ICD-10-CM | POA: Diagnosis not present

## 2020-03-06 DIAGNOSIS — F17200 Nicotine dependence, unspecified, uncomplicated: Secondary | ICD-10-CM

## 2020-03-06 DIAGNOSIS — M25562 Pain in left knee: Secondary | ICD-10-CM | POA: Insufficient documentation

## 2020-03-06 LAB — CBC WITH DIFFERENTIAL/PLATELET
Abs Immature Granulocytes: 0.03 10*3/uL (ref 0.00–0.07)
Basophils Absolute: 0.1 10*3/uL (ref 0.0–0.1)
Basophils Relative: 1 %
Eosinophils Absolute: 0.1 10*3/uL (ref 0.0–0.5)
Eosinophils Relative: 1 %
HCT: 37.2 % (ref 36.0–46.0)
Hemoglobin: 12.6 g/dL (ref 12.0–15.0)
Immature Granulocytes: 0 %
Lymphocytes Relative: 21 %
Lymphs Abs: 2.1 10*3/uL (ref 0.7–4.0)
MCH: 30.9 pg (ref 26.0–34.0)
MCHC: 33.9 g/dL (ref 30.0–36.0)
MCV: 91.2 fL (ref 80.0–100.0)
Monocytes Absolute: 0.4 10*3/uL (ref 0.1–1.0)
Monocytes Relative: 4 %
Neutro Abs: 7.2 10*3/uL (ref 1.7–7.7)
Neutrophils Relative %: 73 %
Platelets: 230 10*3/uL (ref 150–400)
RBC: 4.08 MIL/uL (ref 3.87–5.11)
RDW: 12.7 % (ref 11.5–15.5)
WBC: 10 10*3/uL (ref 4.0–10.5)
nRBC: 0 % (ref 0.0–0.2)

## 2020-03-06 LAB — COMPREHENSIVE METABOLIC PANEL
ALT: 8 U/L (ref 0–44)
AST: 12 U/L — ABNORMAL LOW (ref 15–41)
Albumin: 3.9 g/dL (ref 3.5–5.0)
Alkaline Phosphatase: 65 U/L (ref 38–126)
Anion gap: 10 (ref 5–15)
BUN: 9 mg/dL (ref 6–20)
CO2: 27 mmol/L (ref 22–32)
Calcium: 9 mg/dL (ref 8.9–10.3)
Chloride: 103 mmol/L (ref 98–111)
Creatinine, Ser: 0.76 mg/dL (ref 0.44–1.00)
GFR calc Af Amer: >60 mL/min (ref >60)
GFR calc non Af Amer: >60 mL/min (ref >60)
Glucose, Bld: 116 mg/dL — ABNORMAL HIGH (ref 70–99)
Potassium: 3.6 mmol/L (ref 3.5–5.1)
Sodium: 140 mmol/L (ref 135–145)
Total Bilirubin: 0.3 mg/dL (ref 0.3–1.2)
Total Protein: 6.5 g/dL (ref 6.5–8.1)

## 2020-03-06 NOTE — Progress Notes (Signed)
[facility] OFFICE PROGRESS NOTE  Tamsen Roers, MD [address] [place] [address]   Patient Care Team: Tamsen Roers, MD as PCP - General (Family Medicine) Elayne Snare, MD as Consulting Physician (Endocrinology) Erroll Luna, MD as Consulting Physician (General Surgery) Truitt Merle, MD as Consulting Physician (Hematology) Arta Silence, MD as Consulting Physician (Gastroenterology) 05/05/2017  DIAGNOSIS: Follow up stage I colon cancer   Oncology History Overview Note  Colon cancer   Staging form: Colon and Rectum, AJCC 7th Edition     Pathologic stage from 05/16/2015: Stage I (T1, N0, cM0) - Signed by Truitt Merle, MD on 06/08/2015     Cancer of sigmoid colon ([place])  02/26/2015 Initial Diagnosis   Sigmoid Colon cancer   02/26/2015 Procedure   Colonoscopy showed internal hemorrhoids, 2 polyps in the transverse and ileocecal valve. There is a ulcerated polypoid lesion in the sigmoid colon which was biopsied.   03/02/2015 Tumor Marker   CEA= 2.5   03/06/2015 Imaging   CT chest, abdomen and pelvis with contrast showed no evidence of metastatic disease.   04/22/2015 Surgery   Sigmoid colon segmental resection, showed grade 2 adenocarcinoma, T1, 1.3 cm, 10 lymph nodes were negative, no lymphovascular invasion, no perineural invasion.      CURRENT THERAPY: Observation   INTERVAL HISTORY: [name] 54 y.o. female returns to the clinic today for a follow-up visit accompanied by her daughter.  The patient was last seen 1 year ago.  The patient has been doing fairly well except she is experiencing left knee pain in which she was told she needs a knee replacement.  Otherwise the patient denies any abdominal pain, early satiety, abdominal distention, melena, hematochezia, diarrhea, or constipation.  She had an episode of significant constipation in December 2020 which required a visit to the emergency room.  A CT scan of her abdomen and pelvis was performed at that time  which noted constipation/large volume of stool without any evidence of obstruction, stricture, abdominal lymphadenopathy, or colonic mass.  Her constipation resolved with the interventions recommended by ER practitioners.  She has not had any similar issues at this time.  She denies any chest pain, shortness of breath, cough, or hemoptysis.  She denies any jaundice or itching.  She denies any unexplained weight loss.  The results of her last colonoscopy were faxed to the clinic today.  Her last colonoscopy was in 2019.  No concerning findings were seen at that time and it was recommended that she have a repeat colonoscopy in 3 years (~2022).  The patient quit smoking in the Fall 2020.  She has been using vape pens instead of smoking.  She is here today for evaluation and repeat blood work.  MEDICAL HISTORY: Past Medical History:  Diagnosis Date  . Anxiety   . Arthritis   . Bipolar disorder ([place])   . Broken jaw ([place])   . Broken wrist   . Chronic back pain   . Claudication ([place])    a. 12/2013 ABI's: R 0.97, L 0.94.  [name] Colon cancer ([place])   . COPD (chronic obstructive pulmonary disease) ([place])   . Depression   . Diabetic Charcot's foot ([place])   . Diabetic foot ulcer ([facility])    chronic left foot ulcer , great toe/  hx recurrent foot ulcer bilaterally  . DJD (degenerative joint disease)   . Edema of both lower extremities   . Episode of memory loss   . Family history of bladder  cancer   . Family history of colon cancer   . Family history of kidney cancer   . GERD (gastroesophageal reflux disease)   . Headache(784.0)   . Hiatal hernia   . History of colon cancer, stage I dx 02-26-2015--- oncologist-- dr [name]--- per last note no recurrence   05-16-2015 s/p  Laparoscopic sigmoid colectomy w/ node bx's (negative nodes per path)  Stage I (pT1,N0,M0) Grade 2  . 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   per MRI multiple small strokes post cardiac cath. --  residual mild  memory loss  . History of methicillin resistant staphylococcus aureus (MRSA)   . Hypercholesterolemia   . Hypertension   . Insomnia   . Insulin dependent type 2 diabetes mellitus, uncontrolled ([place]) dx 2004   endocrinologist-  dr Dwyane Dee-- last A1c 8.8 in Aug2018:  pt is noncompliant w/ diet, states does note eat breakfast , her first main meal in afternoon  . Neurogenic bladder   . Neuropathy, diabetic ([place])    hands and feet  . OSA (obstructive sleep apnea)    cpap intolerant  . Personality disorder ([place])   . Restless leg syndrome   . SOB (shortness of breath) on exertion   . Stroke ([place])   . SUI (stress urinary incontinence, female) S/P SLING 12-29-2011    ALLERGIES:  is allergic to latex; sweetening enhancer [flavoring agent]; ropinirole; aspartame and phenylalanine; ibuprofen; and trazodone and nefazodone.  MEDICATIONS:  Current Outpatient Medications  Medication Sig Dispense Refill  . aspirin-acetaminophen-caffeine (EXCEDRIN MIGRAINE) 250-250-65 MG tablet Take 2 tablets by mouth every 6 (six) hours as needed for headache.    [name] atorvastatin (LIPITOR) 40 MG tablet Take 40 mg by mouth at bedtime.    . bisoprolol (ZEBETA) 5 MG tablet Take 1 tablet (5 mg total) by mouth daily. 90 tablet 3  . Continuous Blood Gluc Sensor (FREESTYLE LIBRE 14 DAY SENSOR) MISC 2 each by Does not apply route every 14 (fourteen) days. Use 1 sensor every 14 days to monitor blood sugar. 2 each 0  . diazepam (VALIUM) 5 MG tablet Take 5 mg by mouth 2 (two) times daily.     [name] esomeprazole (NEXIUM) 40 MG capsule Take 40 mg by mouth daily.     . fenofibrate (TRICOR) 48 MG tablet Take 48 mg by mouth daily.    [name] gabapentin (NEURONTIN) 600 MG tablet Take 600 mg by mouth 3 (three) times daily.    [name] glucose blood (FREESTYLE TEST STRIPS) test strip USE TO CHECK BLOOD SUGARS DAILY. DX CODE E11.65 50 each 3  . HETLIOZ 20 MG CAPS Take 1 capsule by mouth at bedtime.    . INGREZZA 80 MG CAPS Take 80 mg by mouth at bedtime.     . Insulin Glargine (BASAGLAR KWIKPEN) 100 UNIT/ML SOPN Inject 0.22 mLs (22 Units total) into the skin daily. 15 mL 3  . Insulin Pen Needle (BD PEN NEEDLE NANO 2ND GEN) 32G X 4 MM MISC 1 each by Does not apply route daily. Use to inject insulin daily. 100 each 1  . JARDIANCE 25 MG TABS tablet TAKE 1 TAB BY MOUTH DAILY BEFORE BREAKFAST. FINSIH 10MG DOSE USING 2 TABS DAILY THEN START 25MG 30 tablet 2  . LINZESS 72 MCG capsule as needed.     [name] losartan (COZAAR) 25 MG tablet Take 1 tablet (25 mg total) by mouth daily. 90 tablet 3  . metFORMIN (GLUCOPHAGE-XR) 750 MG 24 hr tablet Take 750  mg by mouth 2 (two) times daily.    [name] oxyCODONE-acetaminophen (PERCOCET) 10-325 MG tablet     . pantoprazole (PROTONIX) 40 MG tablet     . QUEtiapine (SEROQUEL) 300 MG tablet Take 600 mg by mouth at bedtime. One hour before bed  1  . VRAYLAR 6 MG CAPS Take 6 mg by mouth at bedtime.   1   No current facility-administered medications for this visit.    SURGICAL HISTORY:  Past Surgical History:  Procedure Laterality Date  . AMPUTATION Right 04/13/2018   Procedure: RIGHT BELOW KNEE AMPUTATION;  Surgeon: Newt Minion, MD;  Location: [facility];  Service: Orthopedics;  Laterality: Right;  . AMPUTATION Left 10/26/2018   Procedure: LEFT FOOT FIRST RAY AMPUTATION;  Surgeon: Newt Minion, MD;  Location: [facility];  Service: Orthopedics;  Laterality: Left;  . AMPUTATION TOE Left 05/10/2019   Procedure: AMPUTATION 2nd left TOE WITH METATARSAL;  Surgeon: Landis Martins, DPM;  Location: [facility];  Service: Podiatry;  Laterality: Left;  . ANTERIOR CERVICAL DECOMP/DISCECTOMY FUSION  2000   C5 - 7  . APPLICATION OF WOUND VAC Left 10/26/2018   Procedure: APPLICATION OF WOUND VAC;  Surgeon: Newt Minion, MD;  Location: [facility];  Service: Orthopedics;  Laterality: Left;  . BACK SURGERY    . CARDIAC CATHETERIZATION  05-22-2008   DR SKAINS   NO SIGNIFECANT CAD/ NORMAL LV/  EF 65%/  NO WALL MOTION ABNORMALITIES  . CARPAL TUNNEL RELEASE  Right 04-25-2013  . CATARACT EXTRACTION Right 10/24/2018  . [place]; 1992  . COLON SURGERY    . COLONOSCOPY    . CYSTO N/A 04/30/2013   Procedure: CYSTOSCOPY;  Surgeon: Reece Packer, MD;  Location: [facility];  Service: Urology;  Laterality: N/A;  . CYSTOSCOPY MACROPLASTIQUE IMPLANT N/A 02/06/2018   Procedure: CYSTOSCOPY MACROPLASTIQUE IMPLANT;  Surgeon: Bjorn Loser, MD;  Location: [facility];  Service: Urology;  Laterality: N/A;  . CYSTOSCOPY WITH INJECTION  05/04/2012   Procedure: CYSTOSCOPY WITH INJECTION;  Surgeon: Reece Packer, MD;  Location: [facility];  Service: Urology;  Laterality: N/A;  MACROPLASTIQUE INJECTION  . CYSTOSCOPY WITH INJECTION  08/28/2012   Procedure: CYSTOSCOPY WITH INJECTION;  Surgeon: Reece Packer, MD;  Location: [facility];  Service: Urology;  Laterality: N/A;  cysto and macroplastique   . FOOT SURGERY Bilateral    "related to [place]"  . HERNIA REPAIR  ?1996   "stomach"  . I & D EXTREMITY Left 10/25/2018   Procedure: IRRIGATION AND DEBRIDEMENT GREAT TOE;  Surgeon: Meredith Pel, MD;  Location: [facility];  Service: Orthopedics;  Laterality: Left;  . INCISION AND DRAINAGE Left 10/25/2018   GREAT TOE  . INCISION AND DRAINAGE OF WOUND Right 04/11/2018   Procedure: IRRIGATION AND DEBRIDEMENT WOUND right foot and right ankle;  Surgeon: Evelina Bucy, DPM;  Location: [facility];  Service: Podiatry;  Laterality: Right;  . KNEE ARTHROSCOPY Left   . KNEE ARTHROSCOPY W/ ALLOGRAFT IMPANT Left    graft x 2  . KNEE SURGERY     TOTAL 8 SURG'S  . LAPAROSCOPIC SIGMOID COLECTOMY N/A 04/22/2015   Procedure: LAPAROSCOPIC HAND ASSISTED SIGMOID COLECTOMY;  Surgeon: Erroll Luna, MD;  Location: [facility];  Service: General;  Laterality: N/A;  . LEFT HEART CATHETERIZATION WITH CORONARY ANGIOGRAM N/A 02/12/2014   Procedure: LEFT HEART CATHETERIZATION WITH CORONARY ANGIOGRAM;  Surgeon: Candee Furbish, MD;  Location:  [facility];  Service: Cardiovascular;  Laterality: N/A;   No angiographically significant CAD; normal LVSF, LVEDP 29mHg,  EF 55% (new finding ef 30% myoview 01-08-2014)  . LUMBAR FUSION    . MANDIBLE FRACTURE SURGERY    . MULTIPLE LAPAROSCOPIES FOR ENDOMETRIOSIS    . PUBOVAGINAL SLING  12/29/2011   Procedure: PGaynelle Arabian  Surgeon: SReece Packer MD;  [facility]  Service: Urology;  Laterality: N/A;  cysto and sparc sling   . PUBOVAGINAL SLING N/A 04/30/2013   Procedure: REMOVAL OF VAGINAL MESH;  Surgeon: SReece Packer MD;  [facility];  Service: Urology;  Laterality: N/A;  . RECONSTURCTION OF CONGENITAL UTERUS ANOMALY  1983  . REPEAT RECONSTRUCTION ACL LEFT KNEE/ SCREWS REMOVED  03-28-2000   CADAVER GRAFT  . TOTAL ABDOMINAL HYSTERECTOMY  1997   w/BSO  . TRANSTHORACIC ECHOCARDIOGRAM  02/13/2014   ef 45%, hypokinesis base inferior and base inferolateral walls  . UPPER GI ENDOSCOPY    . WOUND DEBRIDEMENT Right 09/05/2016   Procedure: DEBRIDEMENT WOUND WITH GRAFT RIGHT FOOT;  Surgeon: TLandis Martins DPM;  [facility] [facility]  Service: Podiatry;  Laterality: Right;  . WRIST FRACTURE SURGERY      REVIEW OF SYSTEMS:   Review of Systems  Constitutional: Positive for fatigue. Negative for appetite change, chills,  fever and unexpected weight change.  HENT: Negative for mouth sores, nosebleeds, sore throat and trouble swallowing.   Eyes: Negative for eye problems and icterus.  Respiratory: Negative for cough, hemoptysis, shortness of breath and wheezing.   Cardiovascular: Negative for chest pain and leg swelling.  Gastrointestinal: Negative for abdominal pain, constipation, diarrhea, nausea and vomiting.  Genitourinary: Negative for bladder incontinence, difficulty urinating, dysuria, frequency and hematuria.   Musculoskeletal: Right BKA noted. Negative for back pain, gait problem, neck pain and neck stiffness.  Skin: Negative for itching and rash.   Neurological: Negative for dizziness, extremity weakness, gait problem, headaches, light-headedness and seizures.  Hematological: Negative for adenopathy. Does not bruise/bleed easily.  Psychiatric/Behavioral: Negative for confusion, depression and sleep disturbance. The patient is not nervous/anxious.     PHYSICAL EXAMINATION:  Blood pressure (!) 168/63, pulse 71, temperature 98.7 F (37.1 C), temperature source Temporal, resp. rate 18, height '5\' 5"'  (1.651 m), weight 187 lb 3.2 oz (84.9 kg), SpO2 99 %.  ECOG PERFORMANCE STATUS: 1 - Symptomatic but completely ambulatory  Physical Exam  Constitutional: Oriented to person, place, and time and well-developed, well-nourished, and in no distress.   HENT:  Head: Normocephalic and atraumatic.  Mouth/Throat: Oropharynx is clear and moist. No oropharyngeal exudate.  Eyes: Conjunctivae are normal. Right eye exhibits no discharge. Left eye exhibits no discharge. No scleral icterus.  Neck: Normal range of motion. Neck supple.  Cardiovascular: Normal rate, regular rhythm, normal heart sounds and intact distal pulses.   Pulmonary/Chest: Effort normal and breath sounds normal. No respiratory distress. No wheezes. No rales.  Abdominal: Soft. Bowel sounds are normal. Exhibits no distension and no mass. There is no tenderness.  Musculoskeletal: Right BKA. Normal range of motion. Exhibits no edema.  Lymphadenopathy:    No cervical adenopathy.  Neurological: Alert and oriented to person, place, and time. Exhibits normal muscle tone. Coordination normal.  Skin: Skin is warm and dry. No rash noted. Not diaphoretic. No erythema. No pallor.  Psychiatric: Mood, memory and judgment normal.  Vitals reviewed.  LABORATORY DATA: Lab Results  Component Value Date   WBC 10.0 03/06/2020   HGB 12.6 03/06/2020   HCT 37.2 03/06/2020   MCV 91.2  03/06/2020   PLT 230 03/06/2020      Chemistry      Component Value Date/Time   NA 140 03/06/2020 1400   NA 138  08/02/2019 0848   NA 137 09/08/2017 0900   K 3.6 03/06/2020 1400   K 3.8 09/08/2017 0900   CL 103 03/06/2020 1400   CO2 27 03/06/2020 1400   CO2 23 09/08/2017 0900   BUN 9 03/06/2020 1400   BUN 12 08/02/2019 0848   BUN 6.3 (L) 09/08/2017 0900   CREATININE 0.76 03/06/2020 1400   CREATININE 0.7 09/08/2017 0900      Component Value Date/Time   CALCIUM 9.0 03/06/2020 1400   CALCIUM 9.3 09/08/2017 0900   ALKPHOS 65 03/06/2020 1400   ALKPHOS 92 09/08/2017 0900   AST 12 (L) 03/06/2020 1400   AST 8 09/08/2017 0900   ALT 8 03/06/2020 1400   ALT 6 09/08/2017 0900   BILITOT 0.3 03/06/2020 1400   BILITOT <0.22 09/08/2017 0900       RADIOGRAPHIC STUDIES:  No results found. PATHOLOGY REPORT Diagnosis 04/22/2015 1. Colon, segmental resection for tumor, Sigmoid - COLONIC ADENOCARCINOMA EXTENDING INTO SUBMUCOSA, 1.3 CM - MARGINS NOT INVOLVED. - NINE BENIGN LYMPH NODES (0/9). 2. Colon, segmental resection, Sigmoid - FOCAL MESENTERIC FAT NECROSIS WITH HEMORRHAGE AND FIBROSIS. - NO EVIDENCE OF MALIGNANCY. - ONE BENIGN LYMPH NODE (0/1). 3. Colon, resection margin (donut), Proximal donut - BENIGN COLON TISSUE. - NO EVIDENCE OF MALIGNANCY. 4. Colon, resection margin (donut), Distal donut - BENIGN COLON TISSUE. - NO EVIDENCE OF MALIGNANCY.  Microscopic Comment 1. COLON AND RECTUM (INCLUDING TRANS-ANAL RESECTION): Specimen: Sigmoid colon with proximal and distal donut margins. Procedure: Segmental resection with proximal and distal donut margins. Tumor site: Sigmoid Specimen integrity: Previously opened Macroscopic intactness of mesorectum: Incomplete Macroscopic tumor perforation: No Invasive tumor: Maximum size: 1.3 cm Histologic type(s): Colonic adenocarcinoma Histologic grade and differentiation: G2: moderately differentiated/low grade Type of polyp in which invasive carcinoma arose: No residual polyp Microscopic extension of invasive tumor: Into submucosa Lymph-Vascular  invasion: N Peri-neural invasion: No Tumor deposit(s) (discontinuous extramural extension): No Resection margins: Proximal margin: Free of tumor Distal margin: Free of tumor Circumferential (radial) (posterior ascending, posterior descending; lateral and posterior mid-rectum; and entire lower 1/3 rectum): N/A Mesenteric margin (sigmoid and transverse): Free of tumor Distance closest margin (if all above margins negative): N/A Treatment effect (neo-adjuvant therapy): No Additional polyp(s): No Non-neoplastic findings: Focal mesenteric adipose tissue fat necrosis with hemorrhage and fibrosis. Lymph nodes: number examined 10; number positive: 0 Pathologic Staging: pT1, pN0, pMX Ancillary studies: Pending. (JDP:kh 4 -28-16)  RADIOGRAPHIC STUDIES: I have personally reviewed the radiological images as listed and agreed with the findings in the report.  CT chest, abdomen and pelvis w contrast on 05/06/2016 IMPRESSION: 1. Stable exam. No evidence for metastatic disease in the chest, abdomen, or pelvis. 2. Suture line in the mid sigmoid colon, compatible with partial colectomy. 3. Centrilobular emphysema. 4. Steatosis.  ASSESSMENT/PLAN:  This a very pleasant 54 year old Caucasian female with multiple medical comorbidities who was found to have stage I sigmoid colon cancer in 2016.  1. Stage I sigmoid colon adenocarcinoma, pT1N0M0, stage I, moderately differentiated, MMR normal -She was diagnosed in March 2016, has been under surveillance since then, not compliant with f/u.  - Her surveillance scan from May 2017 showed no evidence of recurrence. -Records reviewed today (03/06/2020)  from her last colonoscopy in 2019 by Dr. Paulita Fujita. Negative for recurrence. Recommended repeat colonoscopy in 3 years.  -Lab  reviewed, CBC and CMP normal. CEA pending.  No suspicious  complaints for colon cancer recurrence, exam was unremarkable. -It has been 5 years since her initial diagnosis, the risk of  recurrence is minimal now. -She has completed her 5 year surveillance. Recommend she follow with her PCP pending the results of her CEA.   2. Depression and coping -stable   3. Poorly controlled diabetes -not compliant with diet and self-monitoring -I strongly encourage pt to follow up with Dr. Dwyane Dee.   4.  and hypertension, history of stroke, COPD -f/u with PCP  -Blood pressure elevated in clinic. She has follow up with him scheduled for next week.   5. Smoking cessation -She previously had discussion with Dr. [name] about smoking cessation. She quit smoking in 2020; however, she has been using Vaper pens.  -I gave the patient handouts on smoking cessation with information about nicotine patches or gum, which are OTC.   Plan -Released to PCP pending CEA results.  -I will personally call the patient to give her CEA results once available.    No orders of the defined types were placed in this encounter.    Jazlen Ogarro L Keontre Defino, PA-C 03/06/20

## 2020-03-09 ENCOUNTER — Telehealth: Payer: Self-pay

## 2020-03-09 ENCOUNTER — Encounter: Payer: Self-pay | Admitting: Endocrinology

## 2020-03-09 ENCOUNTER — Telehealth: Payer: Self-pay | Admitting: Physician Assistant

## 2020-03-09 ENCOUNTER — Other Ambulatory Visit: Payer: Self-pay

## 2020-03-09 ENCOUNTER — Ambulatory Visit (INDEPENDENT_AMBULATORY_CARE_PROVIDER_SITE_OTHER): Payer: Medicare Other | Admitting: Endocrinology

## 2020-03-09 VITALS — BP 140/70 | HR 65 | Ht 65.0 in | Wt 184.4 lb

## 2020-03-09 DIAGNOSIS — Z794 Long term (current) use of insulin: Secondary | ICD-10-CM | POA: Diagnosis not present

## 2020-03-09 DIAGNOSIS — E1165 Type 2 diabetes mellitus with hyperglycemia: Secondary | ICD-10-CM | POA: Diagnosis not present

## 2020-03-09 DIAGNOSIS — I10 Essential (primary) hypertension: Secondary | ICD-10-CM | POA: Diagnosis not present

## 2020-03-09 DIAGNOSIS — E1142 Type 2 diabetes mellitus with diabetic polyneuropathy: Secondary | ICD-10-CM

## 2020-03-09 LAB — POCT GLYCOSYLATED HEMOGLOBIN (HGB A1C): Hemoglobin A1C: 5.6 % (ref 4.0–5.6)

## 2020-03-09 LAB — CEA (IN HOUSE-CHCC): CEA (CHCC-In House): 1.25 ng/mL (ref 0.00–5.00)

## 2020-03-09 NOTE — Telephone Encounter (Signed)
Per 3.12 los Will follow with PCP

## 2020-03-09 NOTE — Patient Instructions (Addendum)
Basaglar 18 units   If sugar is < 90 in am every week cut dose by 2 units more  REDUCE SWEET DRINKS 50%  See Dr Gershon Crane  Check blood sugars on waking up 6 days a week  Also check blood sugars about 2 hours after meals and do this after different meals by rotation  Recommended blood sugar levels on waking up are 90-130 and about 2 hours after meal is 130-160  Please bring your blood sugar monitor to each visit, thank you    You may register to get the vaccine at [facility] website  [email or web address]  You can also go directly to any of the following options:  1.  [place] website   [email or web address]  Telephone number: [phone], Option 2   2.  F[employer] program at the [email or web address]   3.  You can register at the [place] website   [email or web address] or call [phone]   4.  Also can check on [email or web address]

## 2020-03-09 NOTE — Progress Notes (Signed)
Patient ID: [name], female   DOB: [birth year], 54 y.o.   MRN: [id number]   Reason for Appointment: Diabetes follow-up    History of Present Illness:   Diagnosis: Type 2 diabetes mellitus, date of diagnosis: 2004.  PAST history: She has been treated mostly with insulin since about a year after diagnosis. She has had difficulty with consistent compliance with diet and also compliance with self care including glucose monitoring over the years. She had been mostly treated with basal insulin. Also had been tried on mealtime insulin but she would be noncompliant with this. Was tried on Prandin for mealtime control but difficult to judge efficacy because of lack of postprandial monitoring. Was given Victoza to start in 2011 but did not follow up after this.She was tried on premixed insulin but this did not help her control, mostly because of noncompliance with the doses. She had been using the V- go pump and had better control initially and was better compliant with the daily routine and boluses. She has had frequent education visits also. She stopped using her V.-go pump in 6/15 because of discomfort at the site of application, mostly was using this on her abdomen Also did not improve with a trial of Victoza   RECENT history:    INSULIN dose: Basaglar 22 units in am.  NovoLog 0 with meals  Non-insulin hypoglycemic drugs: Metformin ER 1500 mg daily, Jardiance 25 mg daily  Her A1c is about the same at 5.6 compared to 5.8   Current management, problems identified:   She continues to have low normal readings most of the time except in the late afternoons and evenings  She does not think she feels hypoglycemic when her blood sugars are low normal or low including overnight  She was told to reduce her Basaglar but she is still taking 22 units  Her Vania Rea has been increased and she has had no increased yeast infections or change in renal function  As before she is overall  trying to eat healthy meals  However she is still drinking at least 16 ounces of [facility] along with 32 ounces of sweet tea daily, she likes to drink water otherwise all day  She takes her Jardiance at 8 PM instead of before her first meal of the day since she wants to take all her medications at the same time  She is still complaining about gaining weight although but her weight has been about the same since 12/20 Still not able to do much activity  CONTINUOUS GLUCOSE MONITORING RECORD INTERPRETATION    Dates of Recording: 3/2 through 3/15  Sensor description: Elenor Legato  Results statistics:   CGM use % of time  55  Average and SD  104, GV is 40  Time in range      76%  % Time Above 180  7  % Time above 250   % Time Below target  17    PRE-MEAL Fasting Lunch Dinner Bedtime Overall  Glucose range:       Mean/median:  84  98  123  151  104   POST-MEAL PC Breakfast PC Lunch PC Dinner  Glucose range:     Mean/median:  92  105  184    Glycemic patterns summary: Freestyle libre is reading slightly lower than the actual blood sugars with blood sugar 88 on her libre compared to 94 with fingerstick today Blood sugars are low normal between about 3 AM and 11 AM on an  average She does have periodic hyperglycemia after about 6 PM but not enough data present with highest blood sugar likely around 240  Hyperglycemic episodes occurring sporadically in the late afternoons or evenings after 6 PM and transient.  Not enough data available on evening blood sugars on some days  Hypoglycemic episodes are seen with mildly low persistent readings through the night or before about noontime seen on most days for variable durations  Overnight periods: Blood sugar slightly low normal or below 70 and fairly steady  Preprandial periods: Her mealtimes are quite variable  Postprandial periods:   As above he has hypoglycemia sporadically at different times especially in the late afternoon or evening  depending on her intake and also increased blood sugar twice midday   Glucometer:  FreeStyle libre.   CGM use % of time  40  2-week average/SD  128, GV 36  Time in range     79   %  % Time Above 180  10  % Time above 250  3  % Time Below 70  8     PRE-MEAL Fasting Lunch Dinner  4-6 AM Overall  Glucose range:  94-132      Averages:  113   145  85  128   POST-MEAL PC Breakfast PC Lunch PC Dinner  Glucose range:     Averages:    196      Food preferences: eating Mostly 1 meal per day around 6-8 pm, variable intake, sandwiches at times or otherwise snacks,  Physical activity: exercise: Minimal.  Certified Diabetes Educator visit: Most recent:, 2/14.     Wt Readings from Last 3 Encounters:  03/09/20 184 lb 6.4 oz (83.6 kg)  03/06/20 187 lb 3.2 oz (84.9 kg)  01/20/20 184 lb (83.5 kg)   DM labs:   Lab Results  Component Value Date   HGBA1C 5.6 03/09/2020   HGBA1C 5.8 (A) 12/03/2019   HGBA1C 6.5 08/22/2019   Lab Results  Component Value Date   MICROALBUR <0.7 12/03/2019   [place] 37 04/12/2018   CREATININE 0.76 03/06/2020       Other active problems: See review of systems    Allergies as of 03/09/2020      Reactions   Latex Itching, Rash, Other (See Comments)   Pt states she cannot use condoms - cause an infection.  Use of latex on skin is okay for short period of time.  Tape causes rash   Sweetening Enhancer [flavoring Agent] Nausea And Vomiting, Other (See Comments)   HEADACHES   Ropinirole    Pt stated, "I am getting cramps in my legs - especially at night"   Aspartame And Phenylalanine Nausea And Vomiting   HEADACHES   Ibuprofen Other (See Comments)   HEADACHES   Trazodone And Nefazodone Other (See Comments)   Hallucinations      Medication List       Accurate as of March 09, 2020  9:25 PM. If you have any questions, ask your nurse or doctor.        aspirin-acetaminophen-caffeine 250-250-65 MG tablet Commonly known as: EXCEDRIN MIGRAINE  Take 2 tablets by mouth every 6 (six) hours as needed for headache.   atorvastatin 40 MG tablet Commonly known as: LIPITOR Take 40 mg by mouth at bedtime.   Basaglar KwikPen 100 UNIT/ML Inject 0.22 mLs (22 Units total) into the skin daily.   BD Pen Needle Nano 2nd Gen 32G X 4 MM Misc Generic drug: Insulin Pen Needle 1 each  by Does not apply route daily. Use to inject insulin daily.   bisoprolol 5 MG tablet Commonly known as: ZEBETA Take 1 tablet (5 mg total) by mouth daily.   diazepam 5 MG tablet Commonly known as: VALIUM Take 5 mg by mouth 2 (two) times daily.   esomeprazole 40 MG capsule Commonly known as: NEXIUM Take 40 mg by mouth daily.   fenofibrate 48 MG tablet Commonly known as: TRICOR Take 48 mg by mouth daily.   FreeStyle Libre 14 Day Sensor Misc 2 each by Does not apply route every 14 (fourteen) days. Use 1 sensor every 14 days to monitor blood sugar.   gabapentin 600 MG tablet Commonly known as: NEURONTIN Take 600 mg by mouth 3 (three) times daily.   glucose blood test strip Commonly known as: FREESTYLE TEST STRIPS USE TO CHECK BLOOD SUGARS DAILY. DX CODE E11.65   Hetlioz 20 MG Caps Generic drug: Tasimelteon Take 1 capsule by mouth at bedtime.   Ingrezza 80 MG Caps Generic drug: Valbenazine Tosylate Take 80 mg by mouth at bedtime.   Jardiance 25 MG Tabs tablet Generic drug: empagliflozin Take 25 mg by mouth daily. What changed: Another medication with the same name was removed. Continue taking this medication, and follow the directions you see here. Changed by: Elayne Snare, MD   Linzess 72 MCG capsule Generic drug: linaclotide as needed.   losartan 25 MG tablet Commonly known as: COZAAR Take 1 tablet (25 mg total) by mouth daily.   metFORMIN 750 MG 24 hr tablet Commonly known as: GLUCOPHAGE-XR Take 750 mg by mouth 2 (two) times daily.   oxyCODONE-acetaminophen 10-325 MG tablet Commonly known as: PERCOCET   pantoprazole 40 MG tablet  Commonly known as: PROTONIX   QUEtiapine 300 MG tablet Commonly known as: SEROQUEL Take 600 mg by mouth at bedtime. One hour before bed   Vraylar 6 MG Caps Generic drug: Cariprazine HCl Take 6 mg by mouth at bedtime.       Allergies:  Allergies  Allergen Reactions  . Latex Itching, Rash and Other (See Comments)    Pt states she cannot use condoms - cause an infection.  Use of latex on skin is okay for short period of time.  Tape causes rash  . Sweetening Enhancer [Flavoring Agent] Nausea And Vomiting and Other (See Comments)    HEADACHES  . Ropinirole     Pt stated, "I am getting cramps in my legs - especially at night"  . Aspartame And Phenylalanine Nausea And Vomiting    HEADACHES  . Ibuprofen Other (See Comments)    HEADACHES  . Trazodone And Nefazodone Other (See Comments)    Hallucinations     Past Medical History:  Diagnosis Date  . Anxiety   . Arthritis   . Bipolar disorder ([place])   . Broken jaw ([place])   . Broken wrist   . Chronic back pain   . Claudication ([place])    a. 12/2013 ABI's: R 0.97, L 0.94.  [name] Colon cancer ([place])   . COPD (chronic obstructive pulmonary disease) ([place])   . Depression   . Diabetic Charcot's foot ([place])   . Diabetic foot ulcer (H[facility])    chronic left foot ulcer , great toe/  hx recurrent foot ulcer bilaterally  . DJD (degenerative joint disease)   . Edema of both lower extremities   . Episode of memory loss   . Family history of bladder cancer   . Family history of colon cancer   .  Family history of kidney cancer   . GERD (gastroesophageal reflux disease)   . Headache(784.0)   . Hiatal hernia   . History of colon cancer, stage I dx 02-26-2015--- oncologist-- dr [name]--- per last note no recurrence   05-16-2015 s/p  Laparoscopic sigmoid colectomy w/ node bx's (negative nodes per path)  Stage I (pT1,N0,M0) Grade 2  . 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   per MRI multiple small strokes post cardiac cath.  --  residual mild memory loss  . History of methicillin resistant staphylococcus aureus (MRSA)   . Hypercholesterolemia   . Hypertension   . Insomnia   . Insulin dependent type 2 diabetes mellitus, uncontrolled ([place]) dx 2004   endocrinologist-  dr Dwyane Dee-- last A1c 8.8 in Aug2018:  pt is noncompliant w/ diet, states does note eat breakfast , her first main meal in afternoon  . Neurogenic bladder   . Neuropathy, diabetic ([place])    hands and feet  . OSA (obstructive sleep apnea)    cpap intolerant  . Personality disorder ([place])   . Restless leg syndrome   . SOB (shortness of breath) on exertion   . Stroke ([place])   . SUI (stress urinary incontinence, female) S/P SLING 12-29-2011    Past Surgical History:  Procedure Laterality Date  . AMPUTATION Right 04/13/2018   Procedure: RIGHT BELOW KNEE AMPUTATION;  Surgeon: Newt Minion, MD;  Location: [place];  Service: Orthopedics;  Laterality: Right;  . AMPUTATION Left 10/26/2018   Procedure: LEFT FOOT FIRST RAY AMPUTATION;  Surgeon: Newt Minion, MD;  Location: [place];  Service: Orthopedics;  Laterality: Left;  . AMPUTATION TOE Left 05/10/2019   Procedure: AMPUTATION 2nd left TOE WITH METATARSAL;  Surgeon: Landis Martins, DPM;  Location: [place];  Service: Podiatry;  Laterality: Left;  . ANTERIOR CERVICAL DECOMP/DISCECTOMY FUSION  2000   C5 - 7  . APPLICATION OF WOUND VAC Left 10/26/2018   Procedure: APPLICATION OF WOUND VAC;  Surgeon: Newt Minion, MD;  Location: [place];  Service: Orthopedics;  Laterality: Left;  . BACK SURGERY    . CARDIAC CATHETERIZATION  05-22-2008   DR SKAINS   NO SIGNIFECANT CAD/ NORMAL LV/  EF 65%/  NO WALL MOTION ABNORMALITIES  . CARPAL TUNNEL RELEASE Right 04-25-2013  . CATARACT EXTRACTION Right 10/24/2018  . [place]; 1992  . COLON SURGERY    . COLONOSCOPY    . CYSTO N/A 04/30/2013   Procedure: CYSTOSCOPY;  Surgeon: Reece Packer, MD;  [facility]: [facility];  Service: Urology;  Laterality: N/A;  .  CYSTOSCOPY MACROPLASTIQUE IMPLANT N/A 02/06/2018   Procedure: CYSTOSCOPY MACROPLASTIQUE IMPLANT;  Surgeon: Bjorn Loser, MD;  [facility];  Service: Urology;  Laterality: N/A;  . CYSTOSCOPY WITH INJECTION  05/04/2012   Procedure: CYSTOSCOPY WITH INJECTION;  Surgeon: Reece Packer, MD;  Location: [place];  Service: Urology;  Laterality: N/A;  MACROPLASTIQUE INJECTION  . CYSTOSCOPY WITH INJECTION  08/28/2012   Procedure: CYSTOSCOPY WITH INJECTION;  Surgeon: Reece Packer, MD;  Location[facility];  Service: Urology;  Laterality: N/A;  cysto and macroplastique   . FOOT SURGERY Bilateral    "related to [place]"  . HERNIA REPAIR  ?1996   "stomach"  . I & D EXTREMITY Left 10/25/2018   Procedure: IRRIGATION AND DEBRIDEMENT GREAT TOE;  Surgeon: Meredith Pel, MD;  Location: [place];  Service: Orthopedics;  Laterality: Left;  .  INCISION AND DRAINAGE Left 10/25/2018   GREAT TOE  . INCISION AND DRAINAGE OF WOUND Right 04/11/2018   Procedure: IRRIGATION AND DEBRIDEMENT WOUND right foot and right ankle;  Surgeon: Evelina Bucy, DPM;  [facility];  Service: Podiatry;  Laterality: Right;  . KNEE ARTHROSCOPY Left   . KNEE ARTHROSCOPY W/ ALLOGRAFT IMPANT Left    graft x 2  . KNEE SURGERY     TOTAL 8 SURG'S  . LAPAROSCOPIC SIGMOID COLECTOMY N/A 04/22/2015   Procedure: LAPAROSCOPIC HAND ASSISTED SIGMOID COLECTOMY;  Surgeon: Erroll Luna, MD;  Location: [place];  Service: General;  Laterality: N/A;  . LEFT HEART CATHETERIZATION WITH CORONARY ANGIOGRAM N/A 02/12/2014   Procedure: LEFT HEART CATHETERIZATION WITH CORONARY ANGIOGRAM;  Surgeon: Candee Furbish, MD;  [facility];  Service: Cardiovascular;  Laterality: N/A;   No angiographically significant CAD; normal LVSF, LVEDP 66mmHg,  EF 55% (new finding ef 30% myoview 01-08-2014)  . LUMBAR FUSION    . MANDIBLE FRACTURE SURGERY    . MULTIPLE LAPAROSCOPIES FOR ENDOMETRIOSIS     . PUBOVAGINAL SLING  12/29/2011   Procedure: Gaynelle Arabian;  Surgeon: Reece Packer, MD;  [facility];  Service: Urology;  Laterality: N/A;  cysto and sparc sling   . PUBOVAGINAL SLING N/A 04/30/2013   Procedure: REMOVAL OF VAGINAL MESH;  Surgeon: Reece Packer, MD;  [facility];  Service: Urology;  Laterality: N/A;  . RECONSTURCTION OF CONGENITAL UTERUS ANOMALY  1983  . REPEAT RECONSTRUCTION ACL LEFT KNEE/ SCREWS REMOVED  03-28-2000   CADAVER GRAFT  . TOTAL ABDOMINAL HYSTERECTOMY  1997   w/BSO  . TRANSTHORACIC ECHOCARDIOGRAM  02/13/2014   ef 45%, hypokinesis base inferior and base inferolateral walls  . UPPER GI ENDOSCOPY    . WOUND DEBRIDEMENT Right 09/05/2016   Procedure: DEBRIDEMENT WOUND WITH GRAFT RIGHT FOOT;  Surgeon: Landis Martins, DPM;  Location: [place];  Service: Podiatry;  Laterality: Right;  . WRIST FRACTURE SURGERY      Family History  Problem Relation Age of Onset  . Hypertension Mother   . Diabetes Mother   . Cancer - Other Mother        lymphoma   . Cancer - Other Father        lung, bladder cancer   . Heart attack Father   . Other Father        Lynch syndrome  . Cancer - Other Brother        bladder cancer   . Bladder Cancer Paternal Aunt        Lynch syndrome  . Other Paternal Uncle        Lynch syndrome  . Other Cousin        Lynch syndrome    Social History:  reports that she has been smoking cigarettes. She has a 92.00 pack-year smoking history. She has never used smokeless tobacco. She reports current alcohol use. She reports previous drug use.  Review of Systems -    She has history of high triglycerides previously treated with fenofibrate and is not on any treatment    Lab Results  Component Value Date   CHOL 118 08/22/2019   HDL 37.00 (L) 08/22/2019   LDLCALC 37 04/12/2018   LDLDIRECT 57.0 08/22/2019   TRIG 207.0 (H) 08/22/2019   CHOLHDL 3 08/22/2019     HYPOKALEMIA/hyponatremia: Previously  present Now resolved and not on potassium supplements  Lab Results  Component Value Date   CREATININE 0.76 03/06/2020  BUN 9 03/06/2020   NA 140 03/06/2020   K 3.6 03/06/2020   CL 103 03/06/2020   CO2 27 03/06/2020    Painful neuropathy: She has had persistent symptoms including leg pains tingling and numbness as well as difficulty with balance  She has tried various drugs including gabapentin without much relief She is asking about alternatives but does not want Lyrica because of potential for weight gain Duloxetine has not apparently been discussed but currently is on another antidepressants from psychiatrist   Foot exams: Last showed absent pedal pulses and Absent distal sensation  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 Gershon Crane: Done in 1/19  HYPERTENSION: Mild and well controlled, followed by cardiologist, reportedly also has systolic heart failure with grade 2 diastolic dysfunction. Treated with low-dose losartan and bisoprolol  Also on Jardiance  BP Readings from Last 3 Encounters:  03/09/20 140/70  03/06/20 (!) 168/63  12/16/19 129/70        Examination:   BP 140/70 (BP Location: Left Arm, Patient Position: Sitting, Cuff Size: Normal)   Pulse 65   Ht 5\' 5"  (1.651 m)   Wt 184 lb 6.4 oz (83.6 kg)   SpO2 94%   BMI 30.69 kg/m   Body mass index is 30.69 kg/m.     Diabetic Foot Exam - Simple   Simple Foot Form Diabetic Foot exam was performed with the following findings: Yes   Visual Inspection See comments: Yes Sensation Testing See comments: Yes Pulse Check Posterior Tibialis and Dorsalis pulse intact bilaterally: Yes Comments Distal amputation of toes on the left.  Well-healed scars Absent monofilament sensation and pedal pulses    No ankle edema present  Assesment/Plan:   Diabetes type 2, with previous history of poor control, insulin requiring  See history of present illness for detailed  discussion of current diabetes management, blood sugar patterns and problems identified  Her A1c is consistently improved at 5.6, previously 5.8  Although her freestyle libre readings are relatively lower than actual blood sugars readings are relatively close today on comparison of fingerstick blood sugar She appears to be getting excessive basal insulin with low normal or low readings overnight with minimal symptoms She has not reduced her basal insulin as directed Currently on 25 mg of Jardiance which is likely helping some with postprandial hyperglycemia  Most of her postprandial hyperglycemia in the afternoons and evenings is likely related to the continued intake of sweet drinks such as sweet tea and [place] however to check more readings after dinner since that is somewhat incomplete   Recommendations:  Continue to try and reduce sweet drinks including [facility] and sweet tea, she can try to cut down at least 50% and drink more water  Basaglar 18 units and may need to reduce another 2 units if morning readings are continue to be below 90 and instruction given  Continue 25 mg Jardiance  Consider freestyle libre version 2 when covered by Medicare  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: Advised her to check her feet daily  Preventive care: She needs to follow-up with her ophthalmologist for annual visit her last complete exam on 10/11/2018   HYPERTENSION: Mild and well-controlled Continue follow-up with cardiologist  Dyslipidemia: Will need follow-up on the next visit  Given her telephone numbers for Covid vaccination registration    Patient Instructions  Basaglar 18 units   If sugar is < 90 in am every week cut dose  by 2 units more  REDUCE SWEET DRINKS 50%  See Dr Gershon Crane  Check blood sugars on waking up 6 days a week  Also check blood sugars about 2 hours after meals and do this after different meals by rotation  Recommended blood sugar levels  on waking up are 90-130 and about 2 hours after meal is 130-160  Please bring your blood sugar monitor to each visit, thank you    You may register to get the vaccine at [facility] website  [email or web address]  You can also go directly to any of the following options:  1.  [place] website   [email or web address]  Telephone number: [phone], Option 2   2.  FEMA program at the [email or web address]   3.  You can register at the [place] website   [email or web address] or call [phone]   4.  Also can check on [email or web address]           Elayne Snare 03/09/2020, 9:25 PM

## 2020-03-09 NOTE — Telephone Encounter (Signed)
Contacted patient and made aware of CEA result per [facility] request, and to call back with any questions or concerns.

## 2020-03-13 ENCOUNTER — Telehealth: Payer: Self-pay | Admitting: Cardiovascular Disease

## 2020-03-13 ENCOUNTER — Ambulatory Visit: Payer: Medicare Other | Attending: Internal Medicine

## 2020-03-13 DIAGNOSIS — Z23 Encounter for immunization: Secondary | ICD-10-CM

## 2020-03-13 NOTE — Telephone Encounter (Signed)
Spoke with Josh from [employer]. He states he sent a fax on 03/10/20 for Dr. Acie Fredrickson to sign and send back. I spoke with Lorriane Shire in [facility] who states that she had not received anything. I had Ferney fax papers to me and I would fax them to the office. He agreed. I received fax and refaxed them over to [employer]. Received confirmation that fax was sent successfully. Please have Dr. Acie Fredrickson review and sign and fax back to [facility] at [phone].

## 2020-03-13 NOTE — Progress Notes (Signed)
   Covid-19 Vaccination Clinic  Name:  SHEILYN BOEHLKE    MRN: [id number] DOB: [birth year]  03/13/2020  Ms. Gingrich was observed post Covid-19 immunization for 15 minutes without incident. She was provided with Vaccine Information Sheet and instruction to access the V-Safe system.   Ms. Collignon was instructed to call 911 with any severe reactions post vaccine: [name] Difficulty breathing  . Swelling of face and throat  . A fast heartbeat  . A bad rash all over body  . Dizziness and weakness   Immunizations Administered    Name Date Dose VIS Date Route   Pfizer COVID-19 Vaccine 03/13/2020  2:00 PM 0.3 mL 12/06/2019 Intramuscular   Manufacturer: [place]   Lot: [id number]   [place]: [id number]

## 2020-03-13 NOTE — Telephone Encounter (Signed)
Returned call to [employer] at [employer] to let him know that I have received the fax and that we will return the form when Dr. Acie Fredrickson returns to the office next week. Josh verbalized understanding and thanked me for the call.

## 2020-03-17 NOTE — Telephone Encounter (Signed)
Patient returned call

## 2020-03-17 NOTE — Telephone Encounter (Signed)
Returned call to patient and advised her that there are some diagnoses on her paperwork that Dr. Acie Fredrickson does not feel are consistent with her history. She states she is not aware of genetic testing that she has requested. She states she has requested to be tested for Lynch syndrome due to family hx of this but she does not know with whom she made the request. She states I may shred the paperwork that we have received.

## 2020-03-17 NOTE — Telephone Encounter (Signed)
Left message for patient to call back regarding the paperwork for her genetic testing. I advised that we have some questions about some of the diagnoses she has included in her paperwork.

## 2020-03-19 ENCOUNTER — Ambulatory Visit: Payer: Medicare Other | Admitting: Physician Assistant

## 2020-03-19 ENCOUNTER — Ambulatory Visit: Payer: Medicare Other | Admitting: Orthopedic Surgery

## 2020-03-23 ENCOUNTER — Other Ambulatory Visit: Payer: Self-pay | Admitting: Family Medicine

## 2020-03-23 ENCOUNTER — Encounter: Payer: Self-pay | Admitting: Physician Assistant

## 2020-03-23 ENCOUNTER — Ambulatory Visit (INDEPENDENT_AMBULATORY_CARE_PROVIDER_SITE_OTHER): Payer: Medicare Other | Admitting: Physician Assistant

## 2020-03-23 ENCOUNTER — Other Ambulatory Visit: Payer: Self-pay

## 2020-03-23 DIAGNOSIS — M25562 Pain in left knee: Secondary | ICD-10-CM

## 2020-03-23 DIAGNOSIS — G8929 Other chronic pain: Secondary | ICD-10-CM | POA: Diagnosis not present

## 2020-03-23 LAB — HM DIABETES EYE EXAM

## 2020-03-23 NOTE — Progress Notes (Signed)
Office Visit Note   Patient: [name]           Date of Birth: 02-24-1966           MRN: [id number] Visit Date: 03/23/2020              Requested by: Tamsen Roers, [place],  [place] [address] PCP: Tamsen Roers, MD  Chief Complaint  Patient presents with  . Right Knee - Pain  . Left Knee - Pain      HPI: This is a pleasant woman who is status post right below-knee amputation.  At her last visit she requested a new socket as her current one was ill fitting.  She has obtained this.  She also has a history of left knee arthritis with multiple surgeries.  She did not have any infection during any of these surgeries.  She has tried injections activity modification but still has limiting pain in her left knee she was told that once she obtained her new prosthetic she could schedule a left knee replacement she has a hemoglobin A1c of below 6.  She quit smoking and uses some mild form of vaping Assessment & Plan: Visit Diagnoses:  1. Diabetic ulcer of right midfoot associated with type 2 diabetes mellitus, with necrosis of bone ([address])     Plan: I have placed a scheduling form.  She has some things she needs to check on we will contact her and schedule surgery at her convenience we discussed that she would need to abstain from any type of nicotine during the perioperative.  She is agreed to do this  Follow-Up Instructions: Return in about 1 week (around 03/30/2020).   Ortho Exam  Patient is alert, oriented, no adenopathy, well-dressed, normal affect, normal respiratory effort.  Focused examination of her left knee demonstrates previous surgical incisions.  No effusion no cellulitis pain with range of motion previous x-rays taken demonstrate advanced osteoarthritic findings with spurring.  Interference screw and staple from previous ACL surgery  Imaging: No results found. No images are attached to the encounter.  Labs: Lab Results  Component Value Date   HGBA1C 5.6  03/09/2020   HGBA1C 5.8 (A) 12/03/2019   HGBA1C 6.5 08/22/2019   ESRSEDRATE 55 (H) 02/19/2016   CRP 17.4 (H) 02/19/2016   LABURIC 5.8 02/19/2016   REPTSTATUS 12/17/2019 FINAL 12/16/2019   GRAMSTAIN  05/10/2019    NO WBC SEEN NO ORGANISMS SEEN Performed at [facility], [place] [address], [place], [place] [address]    CULT (A) 12/16/2019    <10,000 COLONIES/mL INSIGNIFICANT GROWTH Performed at [facility] [address], [place], [place] [address]    [place] 05/10/2019   LABORGA STREPTOCOCCUS ANGINOSIS 05/10/2019     Lab Results  Component Value Date   ALBUMIN 3.9 03/06/2020   ALBUMIN 4.0 12/15/2019   ALBUMIN 3.8 08/22/2019   LABURIC 5.8 02/19/2016    Lab Results  Component Value Date   MG 1.8 04/13/2018   MG 1.7 04/12/2018   MG 2.0 10/09/2017   No results found for: VD25OH  No results found for: PREALBUMIN CBC EXTENDED Latest Ref Rng & Units 03/06/2020 12/15/2019 05/10/2019  WBC 4.0 - 10.5 K/uL 10.0 11.8(H) 9.6  RBC 3.87 - 5.11 MIL/uL 4.08 4.26 3.79(L)  HGB 12.0 - 15.0 g/dL 12.6 13.1 11.4(L)  HCT 36.0 - 46.0 % 37.2 38.6 34.2(L)  PLT 150 - 400 K/uL 230 290 435(H)  NEUTROABS 1.7 -  7.7 K/uL 7.2 - -  LYMPHSABS 0.7 - 4.0 K/uL 2.1 - -     There is no height or weight on file to calculate BMI.  Orders:  No orders of the defined types were placed in this encounter.  No orders of the defined types were placed in this encounter.    Procedures: No procedures performed  Clinical Data: No additional findings.  ROS:  All other systems negative, except as noted in the HPI. Review of Systems  Objective: Vital Signs: There were no vitals taken for this visit.  Specialty Comments:  No specialty comments available.  PMFS History: Patient Active Problem List   Diagnosis Date Noted  . Nicotine dependence 03/06/2020  . Genetic testing 03/12/2019  . Family history of colon cancer   . Family history of bladder cancer   . Family  history of kidney cancer   . Postmenopausal bleeding 11/15/2018  . Dog bite of foot, sequela   . Left foot infection 10/25/2018  . Gangrenous disorder ([place]) 09/17/2018  . Cellulitis of left upper extremity   . Poorly controlled diabetes mellitus ([place])   . UTI (urinary tract infection) 05/23/2018  . Acute metabolic encephalopathy 66/05/3015  . GERD (gastroesophageal reflux disease) 05/09/2018  . Tobacco abuse 05/09/2018  . Polypharmacy 05/09/2018  . Restless leg syndrome 04/27/2018  . Anxiety 04/27/2018  . Hyponatremia 04/26/2018  . Noncompliance with dietary restriction 04/26/2018  . Postoperative cellulitis of surgical wound 04/26/2018  . At high risk for falls 04/24/2018  . Noncompliance with safety precautions 04/24/2018  . Leukocytosis 04/24/2018  . Unilateral complete BKA ([place]) 04/16/2018  . PAD (peripheral artery disease) ([place])   . Poorly controlled type 2 diabetes mellitus ([place])   . Subacute osteomyelitis, right ankle and foot ([place])   . Major depressive disorder, recurrent episode ([place]) 04/11/2018  . Diabetic ulcer of right midfoot associated with type 2 diabetes mellitus, with necrosis of bone ([place])   . Abscess of ankle   . Infectious synovitis   . Diabetic foot infection ([place])   . Sepsis ([place]) 04/09/2018  . Melanocytic nevus 02/28/2018  . Lesion of labia 09/27/2017  . Vaginitis and vulvovaginitis 02/19/2016  . Cancer of sigmoid colon ([place]) 03/09/2015  . History of stroke within last year 02/12/2015  . OSA (obstructive sleep apnea) 07/04/2014  . Migraine without aura, with intractable migraine, so stated, without mention of status migrainosus 03/26/2014  . Peripheral neuropathy 03/03/2014  . Abnormal stress test 02/12/2014  . CVA (cerebral infarction) 02/12/2014  . Chest pain   . Abnormal heart rhythm   . Edema   . Hypertension   . Hyperlipemia   . Hypercholesterolemia   . Type II diabetes mellitus, uncontrolled ([place]) 07/22/2013  . Diabetic neuropathy with  neurologic complication ([place]) 01/03/3234  . Diabetic polyneuropathy ([place]) 07/22/2013   Past Medical History:  Diagnosis Date  . Anxiety   . Arthritis   . Bipolar disorder ([place])   . Broken jaw ([place])   . Broken wrist   . Chronic back pain   . Claudication ([place])    a. 12/2013 ABI's: R 0.97, L 0.94.  [name] Colon cancer ([place])   . COPD (chronic obstructive pulmonary disease) ([place])   . Depression   . Diabetic Charcot's foot ([place])   . Diabetic foot ulcer ([facility])    chronic left foot ulcer , great toe/  hx recurrent foot ulcer bilaterally  . DJD (degenerative joint disease)   . Edema of both lower extremities   . Episode  of memory loss   . Family history of bladder cancer   . Family history of colon cancer   . Family history of kidney cancer   . GERD (gastroesophageal reflux disease)   . Headache(784.0)   . Hiatal hernia   . History of colon cancer, stage I dx 02-26-2015--- oncologist-- dr [name]--- per last note no recurrence   05-16-2015 s/p  Laparoscopic sigmoid colectomy w/ node bx's (negative nodes per path)  Stage I (pT1,N0,M0) Grade 2  . 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   per MRI multiple small strokes post cardiac cath. --  residual mild memory loss  . History of methicillin resistant staphylococcus aureus (MRSA)   . Hypercholesterolemia   . Hypertension   . Insomnia   . Insulin dependent type 2 diabetes mellitus, uncontrolled ([place]) dx 2004   endocrinologist-  dr Dwyane Dee-- last A1c 8.8 in Aug2018:  pt is noncompliant w/ diet, states does note eat breakfast , her first main meal in afternoon  . Neurogenic bladder   . Neuropathy, diabetic ([place])    hands and feet  . OSA (obstructive sleep apnea)    cpap intolerant  . Personality disorder ([place])   . Restless leg syndrome   . SOB (shortness of breath) on exertion   . Stroke ([place])   . SUI (stress urinary incontinence, female) S/P SLING 12-29-2011    Family History  Problem Relation Age of Onset  .  Hypertension Mother   . Diabetes Mother   . Cancer - Other Mother        lymphoma   . Cancer - Other Father        lung, bladder cancer   . Heart attack Father   . Other Father        Lynch syndrome  . Cancer - Other Brother        bladder cancer   . Bladder Cancer Paternal Aunt        Lynch syndrome  . Other Paternal Uncle        Lynch syndrome  . Other Cousin        Lynch syndrome    Past Surgical History:  Procedure Laterality Date  . AMPUTATION Right 04/13/2018   Procedure: RIGHT BELOW KNEE AMPUTATION;  Surgeon: Newt Minion, MD;  Location: [facility];  Service: Orthopedics;  Laterality: Right;  . AMPUTATION Left 10/26/2018   Procedure: LEFT FOOT FIRST RAY AMPUTATION;  Surgeon: Newt Minion, MD;  Location: [facility];  Service: Orthopedics;  Laterality: Left;  . AMPUTATION TOE Left 05/10/2019   Procedure: AMPUTATION 2nd left TOE WITH METATARSAL;  Surgeon: Landis Martins, DPM;  Location: [facility];  Service: Podiatry;  Laterality: Left;  . ANTERIOR CERVICAL DECOMP/DISCECTOMY FUSION  2000   C5 - 7  . APPLICATION OF WOUND VAC Left 10/26/2018   Procedure: APPLICATION OF WOUND VAC;  Surgeon: Newt Minion, MD;  Location: [facility];  Service: Orthopedics;  Laterality: Left;  . BACK SURGERY    . CARDIAC CATHETERIZATION  05-22-2008   DR SKAINS   NO SIGNIFECANT CAD/ NORMAL LV/  EF 65%/  NO WALL MOTION ABNORMALITIES  . CARPAL TUNNEL RELEASE Right 04-25-2013  . CATARACT EXTRACTION Right 10/24/2018  . [place]; 1992  . COLON SURGERY    . COLONOSCOPY    . CYSTO N/A 04/30/2013   Procedure: CYSTOSCOPY;  Surgeon: Reece Packer, MD;  Location: [facility];  Service: Urology;  Laterality: N/A;  .  CYSTOSCOPY MACROPLASTIQUE IMPLANT N/A 02/06/2018   Procedure: CYSTOSCOPY MACROPLASTIQUE IMPLANT;  Surgeon: Bjorn Loser, MD;  Location[facility];  Service: Urology;  Laterality: N/A;  . CYSTOSCOPY WITH INJECTION  05/04/2012   Procedure: CYSTOSCOPY WITH INJECTION;  Surgeon: Reece Packer, MD;  Location: [facility];  Service: Urology;  Laterality: N/A;  MACROPLASTIQUE INJECTION  . CYSTOSCOPY WITH INJECTION  08/28/2012   Procedure: CYSTOSCOPY WITH INJECTION;  Surgeon: Reece Packer, MD;  Location[facility];  Service: Urology;  Laterality: N/A;  cysto and macroplastique   . FOOT SURGERY Bilateral    "related to [place]"  . HERNIA REPAIR  ?1996   "stomach"  . I & D EXTREMITY Left 10/25/2018   Procedure: IRRIGATION AND DEBRIDEMENT GREAT TOE;  Surgeon: Meredith Pel, MD;  Location: [facility];  Service: Orthopedics;  Laterality: Left;  . INCISION AND DRAINAGE Left 10/25/2018   GREAT TOE  . INCISION AND DRAINAGE OF WOUND Right 04/11/2018   Procedure: IRRIGATION AND DEBRIDEMENT WOUND right foot and right ankle;  Surgeon: Evelina Bucy, DPM;  Location[facility];  Service: Podiatry;  Laterality: Right;  . KNEE ARTHROSCOPY Left   . KNEE ARTHROSCOPY W/ ALLOGRAFT IMPANT Left    graft x 2  . KNEE SURGERY     TOTAL 8 SURG'S  . LAPAROSCOPIC SIGMOID COLECTOMY N/A 04/22/2015   Procedure: LAPAROSCOPIC HAND ASSISTED SIGMOID COLECTOMY;  Surgeon: Erroll Luna, MD;  Location: [facility];  Service: General;  Laterality: N/A;  . LEFT HEART CATHETERIZATION WITH CORONARY ANGIOGRAM N/A 02/12/2014   Procedure: LEFT HEART CATHETERIZATION WITH CORONARY ANGIOGRAM;  Surgeon: Candee Furbish, MD;  Location[facility];  Service: Cardiovascular;  Laterality: N/A;   No angiographically significant CAD; normal LVSF, LVEDP 78mmHg,  EF 55% (new finding ef 30% myoview 01-08-2014)  . LUMBAR FUSION    . MANDIBLE FRACTURE SURGERY    . MULTIPLE LAPAROSCOPIES FOR ENDOMETRIOSIS    . PUBOVAGINAL SLING  12/29/2011   Procedure: Gaynelle Arabian;  Surgeon: Reece Packer, MD;  Location: [facility];  Service: Urology;  Laterality: N/A;  cysto and sparc sling   . PUBOVAGINAL SLING N/A 04/30/2013   Procedure: REMOVAL OF VAGINAL MESH;  Surgeon: Reece Packer, MD;   [facility];  Service: Urology;  Laterality: N/A;  . RECONSTURCTION OF CONGENITAL UTERUS ANOMALY  1983  . REPEAT RECONSTRUCTION ACL LEFT KNEE/ SCREWS REMOVED  03-28-2000   CADAVER GRAFT  . TOTAL ABDOMINAL HYSTERECTOMY  1997   w/BSO  . TRANSTHORACIC ECHOCARDIOGRAM  02/13/2014   ef 45%, hypokinesis base inferior and base inferolateral walls  . UPPER GI ENDOSCOPY    . WOUND DEBRIDEMENT Right 09/05/2016   Procedure: DEBRIDEMENT WOUND WITH GRAFT RIGHT FOOT;  Surgeon: Landis Martins, DPM;  Location: [facility];  Service: Podiatry;  Laterality: Right;  . WRIST FRACTURE SURGERY     Social History   Occupational History  . Not on file  Tobacco Use  . Smoking status: Current Every Day Smoker    Packs/day: 2.00    Years: 46.00    Pack years: 92.00    Types: Cigarettes  . Smokeless tobacco: Never Used  . Tobacco comment: per pt started smoking  age 46  Substance and Sexual Activity  . Alcohol use: Yes    Comment:  "special occasions only"  . Drug use: Not Currently  . Sexual activity: Yes

## 2020-03-26 ENCOUNTER — Other Ambulatory Visit: Payer: Self-pay | Admitting: Family Medicine

## 2020-03-29 ENCOUNTER — Other Ambulatory Visit: Payer: Self-pay | Admitting: Endocrinology

## 2020-03-30 ENCOUNTER — Other Ambulatory Visit: Payer: Self-pay

## 2020-03-30 MED ORDER — JARDIANCE 25 MG PO TABS: 25.0000 mg | ORAL_TABLET | Freq: Every day | ORAL | 2 refills | Status: DC

## 2020-03-31 ENCOUNTER — Telehealth: Payer: Self-pay | Admitting: Cardiovascular Disease

## 2020-03-31 NOTE — Telephone Encounter (Signed)
Left Josh from [facility] a message to call back regarding paperwork. 4/6

## 2020-03-31 NOTE — Telephone Encounter (Signed)
Follow Up:      [name] is checking on the status of the Cardio Risk Lab order that he faxed a couple of weeks ago.j

## 2020-04-01 NOTE — Telephone Encounter (Signed)
I spoke to [employer] [place] [facility] [place]) and informed them that the paperwork faxed over has been shredded by Sharyn Lull per the patient.  They said that they would reach out to the patient and contact Sharyn Lull, if need be.

## 2020-04-03 ENCOUNTER — Telehealth: Payer: Self-pay | Admitting: Orthopedic Surgery

## 2020-04-03 NOTE — Telephone Encounter (Signed)
I called [name] to discuss scheduling left knee replacement.  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  She states she has been waiting on this for over a year and does not want to miss out of this opportunity.  She asked that I call her back some time in May to check back with her.

## 2020-04-07 ENCOUNTER — Ambulatory Visit: Payer: Medicare Other | Attending: Internal Medicine

## 2020-04-07 DIAGNOSIS — Z23 Encounter for immunization: Secondary | ICD-10-CM

## 2020-04-07 NOTE — Progress Notes (Signed)
   Covid-19 Vaccination Clinic  Name:  [name]    MRN: [id number] DOB: [birth year]  04/07/2020  [name] was observed post Covid-19 immunization for 15 minutes without incident. She was provided with Vaccine Information Sheet and instruction to access the V-Safe system.   [name] was instructed to call 911 with any severe reactions post vaccine: [name] Difficulty breathing  . Swelling of face and throat  . A fast heartbeat  . A bad rash all over body  . Dizziness and weakness   Immunizations Administered    Name Date Dose VIS Date Route   Pfizer COVID-19 Vaccine 04/07/2020  2:56 PM 0.3 mL 12/06/2019 Intramuscular   Manufacturer: [place]   Lot: [id number]   [place]: [id number]

## 2020-04-23 ENCOUNTER — Ambulatory Visit: Payer: Medicare Other | Admitting: Orthotics

## 2020-04-23 ENCOUNTER — Other Ambulatory Visit: Payer: Self-pay

## 2020-04-23 DIAGNOSIS — Z89422 Acquired absence of other left toe(s): Secondary | ICD-10-CM

## 2020-04-23 DIAGNOSIS — Z89511 Acquired absence of right leg below knee: Secondary | ICD-10-CM

## 2020-04-23 NOTE — Progress Notes (Signed)
Custom shoes wrong; should be velcro and rt prostheic didn't fit.

## 2020-05-04 NOTE — Progress Notes (Deleted)
Cardiology Office Note:    Date:  05/04/2020   ID:  HIEDI TOUCHTON, DOB [birth year], MRN [id number]  PCP:  Tamsen Roers, MD  Cardiologist:  No primary care provider on file. *** Electrophysiologist:  None   Referring MD: Tamsen Roers, MD   Chief Complaint:  No chief complaint on file.    Patient Profile:    [name] is a 54 y.o. female with:   Cardiac catheterization in 01/2014: normal coronary arteries  C/b post cath CVA  Systolic CHF  Non-ischemic cardiomyopathy, EF 30-35  Echocardiogram 10/2019: EF 45-50  Hypertension   Hyperlipidemia   COPD  OSA  Diabetes mellitus   Charcot foot  Peripheral Arterial Disease    S/p R BKA 5/19  Colon CA  Palpitations   Rx with beta-blocker   Prior CV studies: Echocardiogram 11/15/2019 EF 45-50, mild septal thickness, diff HK worse in inf base, normal RVSF, mild MR, trivial TR  Carotid US 01/2014 R 1-39  Cardiac catheterization 02/12/2014 Normal coronary arteries   History of Present Illness:    [name] was a prior patient of Dr. Marlou Porch.  She established with Dr. Acie Fredrickson in 06/2019. She was last seen in 10/2019.    The DICTATELATER SmartLink is not supported in this context. ***   Past Medical History:  Diagnosis Date  . Anxiety   . Arthritis   . Bipolar disorder ([place])   . Broken jaw ([place])   . Broken wrist   . Chronic back pain   . Claudication ([place])    a. 12/2013 ABI's: R 0.97, L 0.94.  [name] Colon cancer ([place])   . COPD (chronic obstructive pulmonary disease) ([place])   . Depression   . Diabetic Charcot's foot ([place])   . Diabetic foot ulcer ([facility])    chronic left foot ulcer , great toe/  hx recurrent foot ulcer bilaterally  . DJD (degenerative joint disease)   . Edema of both lower extremities   . Episode of memory loss   . Family history of bladder cancer   . Family history of colon cancer   . Family history of kidney cancer   . GERD (gastroesophageal reflux disease)   . Headache(784.0)   . Hiatal  hernia   . History of colon cancer, stage I dx 02-26-2015--- oncologist-- dr [name]--- per last note no recurrence   05-16-2015 s/p  Laparoscopic sigmoid colectomy w/ node bx's (negative nodes per path)  Stage I (pT1,N0,M0) Grade 2  . 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   per MRI multiple small strokes post cardiac cath. --  residual mild memory loss  . History of methicillin resistant staphylococcus aureus (MRSA)   . Hypercholesterolemia   . Hypertension   . Insomnia   . Insulin dependent type 2 diabetes mellitus, uncontrolled ([place]) dx 2004   endocrinologist-  dr Dwyane Dee-- last A1c 8.8 in Aug2018:  pt is noncompliant w/ diet, states does note eat breakfast , her first main meal in afternoon  . Neurogenic bladder   . Neuropathy, diabetic ([place])    hands and feet  . OSA (obstructive sleep apnea)    cpap intolerant  . Personality disorder ([place])   . Restless leg syndrome   . SOB (shortness of breath) on exertion   . Stroke ([place])   . SUI (stress urinary incontinence, female) S/P SLING 12-29-2011    Current Medications: No outpatient medications have been marked as taking for the 05/05/20 encounter (Appointment) with Richardson Dopp  T, PA-C.     Allergies:   Latex, Sweetening enhancer [flavoring agent], Ropinirole, Aspartame and phenylalanine, Ibuprofen, and Trazodone and nefazodone   Social History   Tobacco Use  . Smoking status: Current Every Day Smoker    Packs/day: 2.00    Years: 46.00    Pack years: 92.00    Types: Cigarettes  . Smokeless tobacco: Never Used  . Tobacco comment: per pt started smoking  age 20  Substance Use Topics  . Alcohol use: Yes    Comment:  "special occasions only"  . Drug use: Not Currently     Family Hx: The patient's family history includes Bladder Cancer in her paternal aunt; Cancer - Other in her brother, father, and mother; Diabetes in her mother; Heart attack in her father; Hypertension in her mother; Other in her  cousin, father, and paternal uncle.  ROS   EKGs/Labs/Other Test Reviewed:    EKG:  EKG is *** ordered today.  The ekg ordered today demonstrates ***  Recent Labs: 12/03/2019: TSH 2.34 03/06/2020: ALT 8; BUN 9; Creatinine, Ser 0.76; Hemoglobin 12.6; Platelets 230; Potassium 3.6; Sodium 140   Recent Lipid Panel Lab Results  Component Value Date/Time   CHOL 118 08/22/2019 02:06 PM   TRIG 207.0 (H) 08/22/2019 02:06 PM   HDL 37.00 (L) 08/22/2019 02:06 PM   CHOLHDL 3 08/22/2019 02:06 PM   LDLCALC 37 04/12/2018 05:53 AM   LDLDIRECT 57.0 08/22/2019 02:06 PM    Physical Exam:    VS:  There were no vitals taken for this visit.    Wt Readings from Last 3 Encounters:  03/09/20 184 lb 6.4 oz (83.6 kg)  03/06/20 187 lb 3.2 oz (84.9 kg)  01/20/20 184 lb (83.5 kg)     Physical Exam ***  ASSESSMENT & PLAN:    ***  Dispo:  No follow-ups on file.   Medication Adjustments/Labs and Tests Ordered: Current medicines are reviewed at length with the patient today.  Concerns regarding medicines are outlined above.  Tests Ordered: No orders of the defined types were placed in this encounter.  Medication Changes: No orders of the defined types were placed in this encounter.   Signed, Richardson Dopp, PA-C  05/04/2020 11:49 PM    [facility] [address], [place], [place]  [address] Phone: [phone]; Fax: [phone]

## 2020-05-05 ENCOUNTER — Ambulatory Visit: Payer: Medicare Other | Admitting: Physician Assistant

## 2020-05-05 DIAGNOSIS — E1165 Type 2 diabetes mellitus with hyperglycemia: Secondary | ICD-10-CM

## 2020-05-05 DIAGNOSIS — Z720 Tobacco use: Secondary | ICD-10-CM

## 2020-05-05 DIAGNOSIS — R002 Palpitations: Secondary | ICD-10-CM

## 2020-05-05 DIAGNOSIS — I739 Peripheral vascular disease, unspecified: Secondary | ICD-10-CM

## 2020-05-05 DIAGNOSIS — I5022 Chronic systolic (congestive) heart failure: Secondary | ICD-10-CM

## 2020-05-05 DIAGNOSIS — I10 Essential (primary) hypertension: Secondary | ICD-10-CM

## 2020-05-05 DIAGNOSIS — Z89511 Acquired absence of right leg below knee: Secondary | ICD-10-CM

## 2020-05-26 ENCOUNTER — Telehealth: Payer: Self-pay | Admitting: Endocrinology

## 2020-05-26 NOTE — Telephone Encounter (Signed)
Patient called to advise that she is having blood sugars in the 490's and low 500's.  She is requesting a fast acting insulin to be sent to [facility] on [address].  Patient also is on her last Free Style sensor, does not know how she when she is going to get new shipment (and no idea who is sending it to her - she is going to follow up on this herself).  Patient in the meantime will also need a blood sugar meter and supplies for meter so that she can test her sugar  [facility] uses [facility] on [address]

## 2020-05-27 NOTE — Telephone Encounter (Signed)
If her sugars have been well controlled without NovoLog previously he does not need to start this again

## 2020-05-27 NOTE — Telephone Encounter (Signed)
Spoke with patient no changes in diet.  Patient was not priming the Basaglar pens so she has not had insulin for 5 days.   The same 5 days is when her sugars were really high.  She has been back on the [place] for 2 days.  She also took extra metformin for 2 days to try and get her sugar down.  Patient is also out of Novalog.

## 2020-05-27 NOTE — Telephone Encounter (Signed)
Called pt and gave her MD message. She verbalized understanding of this.

## 2020-05-27 NOTE — Telephone Encounter (Signed)
How long have her sugars being high?  Need to confirm how much Basaglar she is taking and if she has changed her diet

## 2020-06-04 ENCOUNTER — Ambulatory Visit (INDEPENDENT_AMBULATORY_CARE_PROVIDER_SITE_OTHER): Payer: Medicare Other | Admitting: Orthotics

## 2020-06-04 ENCOUNTER — Other Ambulatory Visit: Payer: Self-pay

## 2020-06-04 DIAGNOSIS — E1142 Type 2 diabetes mellitus with diabetic polyneuropathy: Secondary | ICD-10-CM | POA: Diagnosis not present

## 2020-06-04 DIAGNOSIS — Z89422 Acquired absence of other left toe(s): Secondary | ICD-10-CM | POA: Diagnosis not present

## 2020-06-04 DIAGNOSIS — G2581 Restless legs syndrome: Secondary | ICD-10-CM

## 2020-06-04 DIAGNOSIS — Z89511 Acquired absence of right leg below knee: Secondary | ICD-10-CM

## 2020-06-04 DIAGNOSIS — L97522 Non-pressure chronic ulcer of other part of left foot with fat layer exposed: Secondary | ICD-10-CM | POA: Diagnosis not present

## 2020-06-11 ENCOUNTER — Other Ambulatory Visit: Payer: Self-pay

## 2020-06-11 ENCOUNTER — Encounter: Payer: Self-pay | Admitting: Endocrinology

## 2020-06-11 ENCOUNTER — Ambulatory Visit (INDEPENDENT_AMBULATORY_CARE_PROVIDER_SITE_OTHER): Payer: Medicare Other | Admitting: Endocrinology

## 2020-06-11 VITALS — BP 135/70 | HR 81 | Ht 65.0 in | Wt 189.0 lb

## 2020-06-11 DIAGNOSIS — E1165 Type 2 diabetes mellitus with hyperglycemia: Secondary | ICD-10-CM

## 2020-06-11 DIAGNOSIS — E782 Mixed hyperlipidemia: Secondary | ICD-10-CM | POA: Diagnosis not present

## 2020-06-11 DIAGNOSIS — I10 Essential (primary) hypertension: Secondary | ICD-10-CM

## 2020-06-11 DIAGNOSIS — Z794 Long term (current) use of insulin: Secondary | ICD-10-CM | POA: Diagnosis not present

## 2020-06-11 DIAGNOSIS — E1142 Type 2 diabetes mellitus with diabetic polyneuropathy: Secondary | ICD-10-CM | POA: Diagnosis not present

## 2020-06-11 LAB — POCT GLYCOSYLATED HEMOGLOBIN (HGB A1C): Hemoglobin A1C: 6.7 % — AB (ref 4.0–5.6)

## 2020-06-11 MED ORDER — OZEMPIC (0.25 OR 0.5 MG/DOSE) 2 MG/1.5ML ~~LOC~~ SOPN: 0.5000 mg | PEN_INJECTOR | SUBCUTANEOUS | 2 refills | Status: DC

## 2020-06-11 NOTE — Patient Instructions (Addendum)
Take 18 Basaglar and 6-8 Novolog before meals  Start OZEMPIC injections by dialing 0.25 mg on the pen as shown once weekly on the same day of the week.   You may inject in the sides of the stomach, outer thigh or arm as indicated in the brochure given. If you have any difficulties using the pen see the video at CompPlans.co.za  You will feel fullness of the stomach with starting the medication and should try to keep the portions at meals small.  You may experience nausea in the first few days which usually gets better over time    After 4 weeks increase the dose to 0.5 mg weekly  If you have any questions or persistent side effects please call the office   You may also talk to a nurse educator with [employer] at [phone] Useful website: [email or web address]

## 2020-06-11 NOTE — Progress Notes (Signed)
Patient ID: [name], female   DOB: [birth year], 54 y.o.   MRN: [id number]   Reason for Appointment: Diabetes follow-up    History of Present Illness:   Diagnosis: Type 2 diabetes mellitus, date of diagnosis: 2004.  PAST history: She has been treated mostly with insulin since about a year after diagnosis. She has had difficulty with consistent compliance with diet and also compliance with self care including glucose monitoring over the years. She had been mostly treated with basal insulin. Also had been tried on mealtime insulin but she would be noncompliant with this. Was tried on Prandin for mealtime control but difficult to judge efficacy because of lack of postprandial monitoring. Was given Victoza to start in 2011 but did not follow up after this.She was tried on premixed insulin but this did not help her control, mostly because of noncompliance with the doses. She had been using the V- go pump and had better control initially and was better compliant with the daily routine and boluses. She has had frequent education visits also. She stopped using her V.-go pump in 6/15 because of discomfort at the site of application, mostly was using this on her abdomen Also did not improve with a trial of Victoza   RECENT history:    INSULIN dose: Basaglar 22 units in am.  NovoLog 0 with meals  Non-insulin hypoglycemic drugs: Metformin ER 1500 mg daily, Jardiance 25 mg daily at 8 pm  Her A1c is relatively higher at 6.7 compared to 5.6  Current management, problems identified:   She has a lower A1c done predicted from her blood sugars  Although she did not require any mealtime insulin previously and had relatively good control with only basal insulin and Jardiance/Metformin her blood sugars have now much higher after meals  For some time she says she could not make the insulin fine work for [employer] as it would jam  She thinks that by starting to prime the pen every time she  is not having the issue  However she still is leaving the pen needle on the insulin pen even after using it  Although she thinks she is mostly eating 1 meal a day her blood sugars start climbing after about 12 noon until bedtime  This is despite her saying that she is cutting back on sweet tea and only drinking 16 ounces of [employer] daily  Today she had a breakfast with grits and relatively high fat content and did have an increase in blood sugar with this  Highest blood sugar just over 300 after dinner She has gained a little weight and she thinks she has gained over 20 pounds since last year   CONTINUOUS GLUCOSE MONITORING RECORD INTERPRETATION    Dates of Recording: Last 2 weeks  Sensor description: Elenor Legato  Results statistics:   CGM use % of time  58  Average and SD  153, GV 40  Time in range        64%  % Time Above 180  23  % Time above 250   % Time Below target  for    PRE-MEAL Fasting Lunch Dinner Bedtime Overall  Glucose range:       Mean/median:  112  127  196     POST-MEAL PC Breakfast PC Lunch PC Dinner  Glucose range:     Mean/median: ?   165  244    Glycemic patterns summary: Freestyle libre tends to read lower than the actual blood  sugar but she has not compared with fingerstick Blood sugar data is incomplete with only 58% coverage of the last 2 weeks, most of the readings are missing from early part of the night and late evening Blood sugars are near normal between about 5 AM-1 PM and then progressively rising to a variable extent until about 11 PM  Hyperglycemic episodes are occurring at variable times in the afternoons and evenings with highest readings after 8 PM  Hypoglycemic episodes occurred only once overnight and once before noon  Overnight periods: Blood sugars are excellent after the settle down from high readings at midnight with one episode of hypoglycemia  Preprandial periods: She usually has only 1 meal a day  Postprandial periods:     Since her mealtimes are variable postprandial readings are difficult to quantify consistently but blood sugars are generally rising to a variable extent, sometimes spiking more either early afternoon or later in the evening    Previous CGM data:   CGM use % of time  55  Average and SD  104, GV is 40  Time in range      76%  % Time Above 180  7  % Time above 250   % Time Below target  17    PRE-MEAL Fasting Lunch Dinner Bedtime Overall  Glucose range:       Mean/median:  84  98  123  151  104   POST-MEAL PC Breakfast PC Lunch PC Dinner  Glucose range:     Mean/median:  92  105  184    Food preferences: eating Mostly 1 meal per day around 6-8 pm, variable intake, sandwiches at times or otherwise snacks,  Physical activity: exercise: Minimal.  Certified Diabetes Educator visit: Most recent:, 2/14.     Wt Readings from Last 3 Encounters:  06/11/20 189 lb (85.7 kg)  03/09/20 184 lb 6.4 oz (83.6 kg)  03/06/20 187 lb 3.2 oz (84.9 kg)   DM labs:   Lab Results  Component Value Date   HGBA1C 6.7 (A) 06/11/2020   HGBA1C 5.6 03/09/2020   HGBA1C 5.8 (A) 12/03/2019   Lab Results  Component Value Date   MICROALBUR <0.7 12/03/2019   [place] 37 04/12/2018   CREATININE 0.76 03/06/2020     Other active problems: See review of systems    Allergies as of 06/11/2020      Reactions   Latex Itching, Rash, Other (See Comments)   Pt states she cannot use condoms - cause an infection.  Use of latex on skin is okay for short period of time.  Tape causes rash   Sweetening Enhancer [flavoring Agent] Nausea And Vomiting, Other (See Comments)   HEADACHES   Ropinirole    Pt stated, "I am getting cramps in my legs - especially at night"   Aspartame And Phenylalanine Nausea And Vomiting   HEADACHES   Ibuprofen Other (See Comments)   HEADACHES   Trazodone And Nefazodone Other (See Comments)   Hallucinations      Medication List       Accurate as of June 11, 2020 11:59 PM. If  you have any questions, ask your nurse or doctor.        aspirin-acetaminophen-caffeine 250-250-65 MG tablet Commonly known as: EXCEDRIN MIGRAINE Take 2 tablets by mouth every 6 (six) hours as needed for headache.   atorvastatin 40 MG tablet Commonly known as: LIPITOR Take 40 mg by mouth at bedtime.   Basaglar KwikPen 100 UNIT/ML Inject 0.22 mLs (22  Units total) into the skin daily.   BD Pen Needle Nano 2nd Gen 32G X 4 MM Misc Generic drug: Insulin Pen Needle 1 each by Does not apply route daily. Use to inject insulin daily.   bisoprolol 5 MG tablet Commonly known as: ZEBETA Take 1 tablet (5 mg total) by mouth daily.   diazepam 5 MG tablet Commonly known as: VALIUM Take 5 mg by mouth 2 (two) times daily.   esomeprazole 40 MG capsule Commonly known as: NEXIUM Take 40 mg by mouth daily.   fenofibrate 48 MG tablet Commonly known as: TRICOR Take 48 mg by mouth daily.   FreeStyle Libre 14 Day Sensor Misc 2 each by Does not apply route every 14 (fourteen) days. Use 1 sensor every 14 days to monitor blood sugar.   gabapentin 600 MG tablet Commonly known as: NEURONTIN Take 600 mg by mouth 3 (three) times daily.   glucose blood test strip Commonly known as: FREESTYLE TEST STRIPS USE TO CHECK BLOOD SUGARS DAILY. DX CODE E11.65   Hetlioz 20 MG Caps Generic drug: Tasimelteon Take 1 capsule by mouth at bedtime.   Ingrezza 80 MG Caps Generic drug: Valbenazine Tosylate Take 80 mg by mouth at bedtime.   Jardiance 25 MG Tabs tablet Generic drug: empagliflozin Take 25 mg by mouth daily.   Linzess 72 MCG capsule Generic drug: linaclotide as needed.   losartan 25 MG tablet Commonly known as: COZAAR Take 1 tablet (25 mg total) by mouth daily.   metFORMIN 750 MG 24 hr tablet Commonly known as: GLUCOPHAGE-XR Take 750 mg by mouth 2 (two) times daily.   oxyCODONE-acetaminophen 10-325 MG tablet Commonly known as: PERCOCET   Ozempic (0.25 or 0.5 MG/DOSE) 2 MG/1.5ML  Sopn Generic drug: Semaglutide(0.25 or 0.5MG /DOS) Inject 0.375 mLs (0.5 mg total) into the skin once a week. Started by: Elayne Snare, MD   pantoprazole 40 MG tablet Commonly known as: PROTONIX   QUEtiapine 300 MG tablet Commonly known as: SEROQUEL Take 600 mg by mouth at bedtime. One hour before bed   Vraylar 6 MG Caps Generic drug: Cariprazine HCl Take 6 mg by mouth at bedtime.       Allergies:  Allergies  Allergen Reactions  . Latex Itching, Rash and Other (See Comments)    Pt states she cannot use condoms - cause an infection.  Use of latex on skin is okay for short period of time.  Tape causes rash  . Sweetening Enhancer [Flavoring Agent] Nausea And Vomiting and Other (See Comments)    HEADACHES  . Ropinirole     Pt stated, "I am getting cramps in my legs - especially at night"  . Aspartame And Phenylalanine Nausea And Vomiting    HEADACHES  . Ibuprofen Other (See Comments)    HEADACHES  . Trazodone And Nefazodone Other (See Comments)    Hallucinations     Past Medical History:  Diagnosis Date  . Anxiety   . Arthritis   . Bipolar disorder ([place])   . Broken jaw ([place])   . Broken wrist   . Chronic back pain   . Claudication ([place])    a. 12/2013 ABI's: R 0.97, L 0.94.  [name] Colon cancer ([place])   . COPD (chronic obstructive pulmonary disease) ([place])   . Depression   . Diabetic Charcot's foot ([place])   . Diabetic foot ulcer ([facility])    chronic left foot ulcer , great toe/  hx recurrent foot ulcer bilaterally  . DJD (degenerative joint disease)   .  Edema of both lower extremities   . Episode of memory loss   . Family history of bladder cancer   . Family history of colon cancer   . Family history of kidney cancer   . GERD (gastroesophageal reflux disease)   . Headache(784.0)   . Hiatal hernia   . History of colon cancer, stage I dx 02-26-2015--- oncologist-- dr [name]--- per last note no recurrence   05-16-2015 s/p  Laparoscopic sigmoid colectomy w/ node bx's (negative  nodes per path)  Stage I (pT1,N0,M0) Grade 2  . 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   per MRI multiple small strokes post cardiac cath. --  residual mild memory loss  . History of methicillin resistant staphylococcus aureus (MRSA)   . Hypercholesterolemia   . Hypertension   . Insomnia   . Insulin dependent type 2 diabetes mellitus, uncontrolled ([place]) dx 2004   endocrinologist-  dr Dwyane Dee-- last A1c 8.8 in Aug2018:  pt is noncompliant w/ diet, states does note eat breakfast , her first main meal in afternoon  . Neurogenic bladder   . Neuropathy, diabetic ([place])    hands and feet  . OSA (obstructive sleep apnea)    cpap intolerant  . Personality disorder ([place])   . Restless leg syndrome   . SOB (shortness of breath) on exertion   . Stroke ([place])   . SUI (stress urinary incontinence, female) S/P SLING 12-29-2011    Past Surgical History:  Procedure Laterality Date  . AMPUTATION Right 04/13/2018   Procedure: RIGHT BELOW KNEE AMPUTATION;  Surgeon: Newt Minion, MD;  Location: [place];  Service: Orthopedics;  Laterality: Right;  . AMPUTATION Left 10/26/2018   Procedure: LEFT FOOT FIRST RAY AMPUTATION;  Surgeon: Newt Minion, MD;  Location: [place];  Service: Orthopedics;  Laterality: Left;  . AMPUTATION TOE Left 05/10/2019   Procedure: AMPUTATION 2nd left TOE WITH METATARSAL;  Surgeon: Landis Martins, DPM;  Location: [place];  Service: Podiatry;  Laterality: Left;  . ANTERIOR CERVICAL DECOMP/DISCECTOMY FUSION  2000   C5 - 7  . APPLICATION OF WOUND VAC Left 10/26/2018   Procedure: APPLICATION OF WOUND VAC;  Surgeon: Newt Minion, MD;  Location: [place];  Service: Orthopedics;  Laterality: Left;  . BACK SURGERY    . CARDIAC CATHETERIZATION  05-22-2008   DR SKAINS   NO SIGNIFECANT CAD/ NORMAL LV/  EF 65%/  NO WALL MOTION ABNORMALITIES  . CARPAL TUNNEL RELEASE Right 04-25-2013  . CATARACT EXTRACTION Right 10/24/2018  . [place]; 1992  . COLON SURGERY     . COLONOSCOPY    . CYSTO N/A 04/30/2013   Procedure: CYSTOSCOPY;  Surgeon: Reece Packer, MD;  Location: [facility];  Service: Urology;  Laterality: N/A;  . CYSTOSCOPY MACROPLASTIQUE IMPLANT N/A 02/06/2018   Procedure: CYSTOSCOPY MACROPLASTIQUE IMPLANT;  Surgeon: Bjorn Loser, MD;  Location: [facility];  Service: Urology;  Laterality: N/A;  . CYSTOSCOPY WITH INJECTION  05/04/2012   Procedure: CYSTOSCOPY WITH INJECTION;  Surgeon: Reece Packer, MD;  Location: [place];  Service: Urology;  Laterality: N/A;  MACROPLASTIQUE INJECTION  . CYSTOSCOPY WITH INJECTION  08/28/2012   Procedure: CYSTOSCOPY WITH INJECTION;  Surgeon: Reece Packer, MD;  Location: [facility];  Service: Urology;  Laterality: N/A;  cysto and macroplastique   . FOOT SURGERY Bilateral    "related to [place]"  . HERNIA REPAIR  ?1996   "stomach"  .  I & D EXTREMITY Left 10/25/2018   Procedure: IRRIGATION AND DEBRIDEMENT GREAT TOE;  Surgeon: Meredith Pel, MD;  Location: [place];  Service: Orthopedics;  Laterality: Left;  . INCISION AND DRAINAGE Left 10/25/2018   GREAT TOE  . INCISION AND DRAINAGE OF WOUND Right 04/11/2018   Procedure: IRRIGATION AND DEBRIDEMENT WOUND right foot and right ankle;  Surgeon: Evelina Bucy, DPM;  Location: [facility];  Service: Podiatry;  Laterality: Right;  . KNEE ARTHROSCOPY Left   . KNEE ARTHROSCOPY W/ ALLOGRAFT IMPANT Left    graft x 2  . KNEE SURGERY     TOTAL 8 SURG'S  . LAPAROSCOPIC SIGMOID COLECTOMY N/A 04/22/2015   Procedure: LAPAROSCOPIC HAND ASSISTED SIGMOID COLECTOMY;  Surgeon: Erroll Luna, MD;  Location: [place];  Service: General;  Laterality: N/A;  . LEFT HEART CATHETERIZATION WITH CORONARY ANGIOGRAM N/A 02/12/2014   Procedure: LEFT HEART CATHETERIZATION WITH CORONARY ANGIOGRAM;  Surgeon: Candee Furbish, MD;  Location[facility];  Service: Cardiovascular;  Laterality: N/A;   No angiographically significant CAD; normal  LVSF, LVEDP 32mmHg,  EF 55% (new finding ef 30% myoview 01-08-2014)  . LUMBAR FUSION    . MANDIBLE FRACTURE SURGERY    . MULTIPLE LAPAROSCOPIES FOR ENDOMETRIOSIS    . PUBOVAGINAL SLING  12/29/2011   Procedure: Gaynelle Arabian;  Surgeon: Reece Packer, MD;  [facility];  Service: Urology;  Laterality: N/A;  cysto and sparc sling   . PUBOVAGINAL SLING N/A 04/30/2013   Procedure: REMOVAL OF VAGINAL MESH;  Surgeon: Reece Packer, MD;  Location[facility];  Service: Urology;  Laterality: N/A;  . RECONSTURCTION OF CONGENITAL UTERUS ANOMALY  1983  . REPEAT RECONSTRUCTION ACL LEFT KNEE/ SCREWS REMOVED  03-28-2000   CADAVER GRAFT  . TOTAL ABDOMINAL HYSTERECTOMY  1997   w/BSO  . TRANSTHORACIC ECHOCARDIOGRAM  02/13/2014   ef 45%, hypokinesis base inferior and base inferolateral walls  . UPPER GI ENDOSCOPY    . WOUND DEBRIDEMENT Right 09/05/2016   Procedure: DEBRIDEMENT WOUND WITH GRAFT RIGHT FOOT;  Surgeon: Landis Martins, DPM;  Location: [place];  Service: Podiatry;  Laterality: Right;  . WRIST FRACTURE SURGERY      Family History  Problem Relation Age of Onset  . Hypertension Mother   . Diabetes Mother   . Cancer - Other Mother        lymphoma   . Cancer - Other Father        lung, bladder cancer   . Heart attack Father   . Other Father        Lynch syndrome  . Cancer - Other Brother        bladder cancer   . Bladder Cancer Paternal Aunt        Lynch syndrome  . Other Paternal Uncle        Lynch syndrome  . Other Cousin        Lynch syndrome    Social History:  reports that she has been smoking cigarettes. She has a 92.00 pack-year smoking history. She has never used smokeless tobacco. She reports current alcohol use. She reports previous drug use.  Review of Systems -    She has history of high triglycerides previously treated with fenofibrate and is not on any treatment    Lab Results  Component Value Date   CHOL 118 08/22/2019   HDL 37.00 (L)  08/22/2019   LDLCALC 37 04/12/2018   LDLDIRECT 57.0 08/22/2019   TRIG 207.0 (H)  08/22/2019   CHOLHDL 3 08/22/2019     HYPOKALEMIA/hyponatremia: No recurrence  Lab Results  Component Value Date   CREATININE 0.76 03/06/2020   BUN 9 03/06/2020   NA 140 03/06/2020   K 3.6 03/06/2020   CL 103 03/06/2020   CO2 27 03/06/2020    Painful neuropathy: She has had persistent symptoms including leg pains tingling and numbness as well as difficulty with balance  Has been on gabapentin but did not get enough relief with this   Did not want Lyrica because of potential for weight gain Duloxetine has not apparently been discussed but currently is on another antidepressants from psychiatrist   Foot exams: Last showed absent pedal pulses and Absent distal sensation  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 Gershon Crane: Done in 1/19  HYPERTENSION: followed by cardiologist, reportedly also has systolic heart failure with grade 2 diastolic dysfunction. Treated with low-dose losartan and bisoprolol  Also on Jardiance  BP Readings from Last 3 Encounters:  06/11/20 135/70  03/09/20 140/70  03/06/20 (!) 168/63        Examination:   BP 135/70   Pulse 81   Ht 5\' 5"  (1.651 m)   Wt 189 lb (85.7 kg)   SpO2 99%   BMI 31.45 kg/m   Body mass index is 31.45 kg/m.     No ankle edema present  Assesment/Plan:   Diabetes type 2, with previous history of poor control, insulin requiring  See history of present illness for detailed discussion of current diabetes management, blood sugar patterns and problems identified  Her A1c is increasing and now 6.7 on the previously is 5.6  She has marked postprandial hyperglycemia now appears to be having more insulin deficiency Although she may benefit from a GLP-1 drug not clear if she has tolerated Victoza and Trulicity about 4 years ago She is wanting a weight loss medication also  Explained to the  patient what GLP-1 drugs are, the sites of actions and the body, reduction of hunger sensation and improved insulin secretion.  Discussed the benefit of weight loss with these medications. Explained possible side effects of OZEMPIC, most commonly nausea that usually improves over time.  Also explained safety information associated with the medication Demonstrated the medication injection device and injection technique to the patient.  To start the injections with the 0.25 mg dosage weekly for the first 4 injections and then go up to 0.5 mg weekly if no continued nausea  Patient brochure on Ozempic with sample pen given  Recommendations:  To check blood sugars more consistently with the freestyle libre for better analysis  Make sure she starts taking NovoLog 6 to 8 units before each meal and may need higher doses if postprandial readings are over 180  Start Ozempic as above  Continue reducing sweet drinks  Basaglar 18 units and may need to reduce another 2 units if morning readings are continue to be below 90 and instruction given  Continue 25 mg Jardiance  Consider freestyle libre version 2 when covered  Discussed that she needs to remove her pen needle once she has used it on the pen  Short-term follow-up   HYPERTENSION: Had mild whitecoat increase today but she thinks this is from pain Continue follow-up with cardiologist  Dyslipidemia: Will need follow-up on the next visit     Patient Instructions  Take 18 Basaglar and 6-8 Novolog before meals  Start OZEMPIC injections by dialing 0.25 mg  on the pen as shown once weekly on the same day of the week.   You may inject in the sides of the stomach, outer thigh or arm as indicated in the brochure given. If you have any difficulties using the pen see the video at CompPlans.co.za  You will feel fullness of the stomach with starting the medication and should try to keep the portions at meals small.  You may experience nausea  in the first few days which usually gets better over time    After 4 weeks increase the dose to 0.5 mg weekly  If you have any questions or persistent side effects please call the office   You may also talk to a nurse educator with [employer] at [phone] Useful website: [email or web address]               Elayne Snare 06/12/2020, 8:44 AM

## 2020-06-24 ENCOUNTER — Other Ambulatory Visit: Payer: Self-pay | Admitting: Endocrinology

## 2020-06-24 ENCOUNTER — Telehealth: Payer: Self-pay | Admitting: Endocrinology

## 2020-06-24 ENCOUNTER — Other Ambulatory Visit: Payer: Self-pay

## 2020-06-24 DIAGNOSIS — E1142 Type 2 diabetes mellitus with diabetic polyneuropathy: Secondary | ICD-10-CM

## 2020-06-24 LAB — LIPID PANEL
Cholesterol: 113 (ref 0–200)
HDL: 45 (ref 35–70)
LDL Cholesterol: 38
Triglycerides: 241 — AB (ref 40–160)

## 2020-06-24 LAB — TSH: TSH: 8.51 — AB (ref <=5.90)

## 2020-06-24 LAB — BASIC METABOLIC PANEL
BUN: 8 (ref 4–21)
CO2: 25 — AB (ref 13–22)
Chloride: 103 (ref 99–108)
Creatinine: 0.8 (ref <=1.1)
Glucose: 219
Potassium: 4 (ref 3.4–5.3)
Sodium: 137 (ref 137–147)

## 2020-06-24 LAB — VITAMIN B12: Vitamin B-12: 321

## 2020-06-24 LAB — VITAMIN D 25 HYDROXY (VIT D DEFICIENCY, FRACTURES): Vit D, 25-Hydroxy: 21

## 2020-06-24 LAB — COMPREHENSIVE METABOLIC PANEL: Calcium: 8.9 (ref 8.7–10.7)

## 2020-06-24 LAB — HEMOGLOBIN A1C: Hemoglobin A1C: 7

## 2020-06-24 MED ORDER — METFORMIN HCL ER 750 MG PO TB24: 750.0000 mg | ORAL_TABLET | Freq: Two times a day (BID) | ORAL | 1 refills | Status: DC

## 2020-06-24 NOTE — Telephone Encounter (Signed)
Rx sent 

## 2020-06-24 NOTE — Telephone Encounter (Signed)
This was resent today because we received a "NG" from the fax machine indicating that the fax did not go through.

## 2020-06-24 NOTE — Telephone Encounter (Signed)
Called pt to set up lab appt. Pt stated that she had labs done at her PCP office and they did a "full workup".  She is going to call her PCP and have them fax these results to this office.

## 2020-06-24 NOTE — Telephone Encounter (Signed)
Patient called stating she is starting the [place] and she wanted to come in to have any necessary labs done prior to doing this - states she wants to have these done prior to her appointment 7/29.

## 2020-06-24 NOTE — Telephone Encounter (Signed)
Labs have already been ordered

## 2020-06-24 NOTE — Telephone Encounter (Signed)
Iona Beard calling from [employer] [place], he is resending the forms he states we sent him regarding pt because it says it is to be 3 pages but he has only received 1.  Just so you know why its coming back

## 2020-06-24 NOTE — Telephone Encounter (Signed)
Medication Refill Request  Did you call your pharmacy and request this refill first? Yes  . If patient has not contacted pharmacy first, instruct them to do so for future refills.  . Remind them that contacting the pharmacy for their refill is the quickest method to get the refill.  . Refill policy also stated that it will take anywhere between 24-72 hours to receive the refill.    Name of medication? metformin  Is this a 90 day supply? yes  Name and location of pharmacy?  [id number] - [place], [place]. Phone:  [phone]  Fax:  [phone]

## 2020-06-25 ENCOUNTER — Telehealth: Payer: Self-pay

## 2020-06-25 NOTE — Telephone Encounter (Signed)
FAXED [facility] (Faxed on 06/24/20) Document: Medicare DWO for CGM Other records requested: none at this time.  All above requested information has been faxed successfully to [employer] listed above. Documents and fax confirmation have been placed in the faxed file for future reference.

## 2020-06-26 ENCOUNTER — Other Ambulatory Visit: Payer: Medicare Other

## 2020-06-28 ENCOUNTER — Other Ambulatory Visit: Payer: Self-pay | Admitting: Endocrinology

## 2020-07-02 ENCOUNTER — Other Ambulatory Visit: Payer: Self-pay

## 2020-07-02 MED ORDER — LOSARTAN POTASSIUM 25 MG PO TABS: 25.0000 mg | ORAL_TABLET | Freq: Every day | ORAL | 1 refills | Status: DC

## 2020-07-08 ENCOUNTER — Other Ambulatory Visit: Payer: Self-pay

## 2020-07-08 ENCOUNTER — Encounter: Payer: Self-pay | Admitting: Endocrinology

## 2020-07-08 ENCOUNTER — Ambulatory Visit (INDEPENDENT_AMBULATORY_CARE_PROVIDER_SITE_OTHER): Payer: Medicare Other | Admitting: Endocrinology

## 2020-07-08 ENCOUNTER — Ambulatory Visit: Payer: Medicare Other | Admitting: Endocrinology

## 2020-07-08 VITALS — BP 140/68 | HR 86 | Ht 65.0 in | Wt 188.4 lb

## 2020-07-08 DIAGNOSIS — E1165 Type 2 diabetes mellitus with hyperglycemia: Secondary | ICD-10-CM

## 2020-07-08 DIAGNOSIS — E039 Hypothyroidism, unspecified: Secondary | ICD-10-CM

## 2020-07-08 DIAGNOSIS — Z794 Long term (current) use of insulin: Secondary | ICD-10-CM | POA: Diagnosis not present

## 2020-07-08 DIAGNOSIS — E782 Mixed hyperlipidemia: Secondary | ICD-10-CM | POA: Diagnosis not present

## 2020-07-08 MED ORDER — LEVOTHYROXINE SODIUM 25 MCG PO TABS: 25.0000 ug | ORAL_TABLET | Freq: Every day | ORAL | 3 refills | Status: DC

## 2020-07-08 NOTE — Progress Notes (Signed)
Patient ID: [name], female   DOB: [birth year], 54 y.o.   MRN: [id number]   Reason for Appointment: Diabetes follow-up    History of Present Illness:   Diagnosis: Type 2 diabetes mellitus, date of diagnosis: 2004.  PAST history: She has been treated mostly with insulin since about a year after diagnosis. She has had difficulty with consistent compliance with diet and also compliance with self care including glucose monitoring over the years. She had been mostly treated with basal insulin. Also had been tried on mealtime insulin but she would be noncompliant with this. Was tried on Prandin for mealtime control but difficult to judge efficacy because of lack of postprandial monitoring. Was given Victoza to start in 2011 but did not follow up after this.She was tried on premixed insulin but this did not help her control, mostly because of noncompliance with the doses. She had been using the V- go pump and had better control initially and was better compliant with the daily routine and boluses. She has had frequent education visits also. She stopped using her V.-go pump in 6/15 because of discomfort at the site of application, mostly was using this on her abdomen Also did not improve with a trial of Victoza   RECENT history:    INSULIN dose: Basaglar 22 units in am.  NovoLog: Not taking  Non-insulin hypoglycemic drugs: Metformin ER 1500 mg daily, Jardiance 25 mg daily at 8 pm  Her A1c is last month 6.7 and repeated by PCP was 7%  Current management, problems identified:   She was told to start Ozempic but she has not done so, this is despite her having the medication at home with her  She says she forgets to do it because it is a weekly dose  She also did not start NovoLog with her meals as directed even though she was appearing to have average blood sugar of 244 after dinner  More recently however she has been trying to cut back on her carbohydrate intake and have a high  protein diet, she was told to start an Atkins diet by her PCP to lose weight  With this her blood sugars appear to be overall better and time within range is 95% compared to 64 from the available data  Most of her sensor readings overnight are not available for review  However she is checking blood sugars only sporadically with her freestyle libre and has only 39% of data available for the last 2 weeks  Also occasionally may still have relatively higher fasting readings  Postprandial blood sugars are difficult to assess and she has had a couple of spikes either in the morning or after dinner  Her weight is about the same  Has occasionally still had some sugar containing drinks   CGM use % of time  39  2-week average/SD  126  Time in range       95%  % Time Above 180 4  % Time above 250   % Time Below 70 0     PRE-MEAL Fasting Lunch Dinner Bedtime Overall  Glucose range:       Averages:  133  122  128   126   POST-MEAL PC Breakfast PC Lunch PC Dinner  Glucose range:     Averages:    133     PREVIOUS data:   CGM use % of time  58  Average and SD  153, GV 40  Time in range  64%  % Time Above 180  23  % Time above 250   % Time Below target  for    PRE-MEAL Fasting Lunch Dinner Bedtime Overall  Glucose range:       Mean/median:  112  127  196     POST-MEAL PC Breakfast PC Lunch PC Dinner  Glucose range:     Mean/median: ?   165  244    Glycemic patterns summary: Freestyle libre tends to read lower than the actual blood sugar but she has not compared with fingerstick Blood sugar data is incomplete with only 58% coverage of the last 2 weeks, most of the readings are missing from early part of the night and late evening Blood sugars are near normal between about 5 AM-1 PM and then progressively rising to a variable extent until about 11 PM   Food preferences: eating Mostly 1 meal per day around 6-8 pm, variable intake, sandwiches at times or otherwise  snacks,  Physical activity: exercise: Minimal.  Certified Diabetes Educator visit: Most recent:, 2/14.     Wt Readings from Last 3 Encounters:  07/08/20 188 lb 6.4 oz (85.5 kg)  06/11/20 189 lb (85.7 kg)  03/09/20 184 lb 6.4 oz (83.6 kg)   DM labs:   Lab Results  Component Value Date   HGBA1C 7.0 06/24/2020   HGBA1C 6.7 (A) 06/11/2020   HGBA1C 5.6 03/09/2020   Lab Results  Component Value Date   MICROALBUR <0.7 12/03/2019   [place] 38 06/24/2020   CREATININE 0.8 06/24/2020     Other active problems: See review of systems    Allergies as of 07/08/2020      Reactions   Latex Itching, Rash, Other (See Comments)   Pt states she cannot use condoms - cause an infection.  Use of latex on skin is okay for short period of time.  Tape causes rash   Sweetening Enhancer [flavoring Agent] Nausea And Vomiting, Other (See Comments)   HEADACHES   Ropinirole    Pt stated, "I am getting cramps in my legs - especially at night"   Aspartame And Phenylalanine Nausea And Vomiting   HEADACHES   Ibuprofen Other (See Comments)   HEADACHES   Trazodone And Nefazodone Other (See Comments)   Hallucinations      Medication List       Accurate as of July 08, 2020  3:42 PM. If you have any questions, ask your nurse or doctor.        aspirin-acetaminophen-caffeine 250-250-65 MG tablet Commonly known as: EXCEDRIN MIGRAINE Take 2 tablets by mouth every 6 (six) hours as needed for headache.   atorvastatin 40 MG tablet Commonly known as: LIPITOR Take 40 mg by mouth at bedtime.   Basaglar KwikPen 100 UNIT/ML Inject 0.22 mLs (22 Units total) into the skin daily. What changed: how much to take   BD Pen Needle Nano 2nd Gen 32G X 4 MM Misc Generic drug: Insulin Pen Needle 1 each by Does not apply route daily. Use to inject insulin daily.   bisoprolol 5 MG tablet Commonly known as: ZEBETA Take 1 tablet (5 mg total) by mouth daily.   diazepam 5 MG tablet Commonly known as: VALIUM Take  5 mg by mouth 2 (two) times daily.   esomeprazole 40 MG capsule Commonly known as: NEXIUM Take 40 mg by mouth daily.   fenofibrate 48 MG tablet Commonly known as: TRICOR Take 48 mg by mouth daily.   FreeStyle Libre 14 Day Sensor Misc  2 each by Does not apply route every 14 (fourteen) days. Use 1 sensor every 14 days to monitor blood sugar.   gabapentin 600 MG tablet Commonly known as: NEURONTIN Take 600 mg by mouth 3 (three) times daily.   glucose blood test strip Commonly known as: FREESTYLE TEST STRIPS USE TO CHECK BLOOD SUGARS DAILY. DX CODE E11.65   Hetlioz 20 MG Caps Generic drug: Tasimelteon Take 1 capsule by mouth at bedtime.   Ingrezza 80 MG Caps Generic drug: Valbenazine Tosylate Take 80 mg by mouth at bedtime.   Jardiance 25 MG Tabs tablet Generic drug: empagliflozin TAKE 1 TABLET BY MOUTH EVERY DAY   Linzess 72 MCG capsule Generic drug: linaclotide as needed.   losartan 25 MG tablet Commonly known as: COZAAR Take 1 tablet (25 mg total) by mouth daily.   metFORMIN 750 MG 24 hr tablet Commonly known as: GLUCOPHAGE-XR Take 1 tablet (750 mg total) by mouth 2 (two) times daily.   oxyCODONE-acetaminophen 10-325 MG tablet Commonly known as: PERCOCET   Ozempic (0.25 or 0.5 MG/DOSE) 2 MG/1.5ML Sopn Generic drug: Semaglutide(0.25 or 0.5MG /DOS) Inject 0.375 mLs (0.5 mg total) into the skin once a week.   pantoprazole 40 MG tablet Commonly known as: PROTONIX   QUEtiapine 300 MG tablet Commonly known as: SEROQUEL Take 600 mg by mouth at bedtime. One hour before bed   Vraylar 6 MG Caps Generic drug: Cariprazine HCl Take 6 mg by mouth at bedtime.       Allergies:  Allergies  Allergen Reactions  . Latex Itching, Rash and Other (See Comments)    Pt states she cannot use condoms - cause an infection.  Use of latex on skin is okay for short period of time.  Tape causes rash  . Sweetening Enhancer [Flavoring Agent] Nausea And Vomiting and Other (See  Comments)    HEADACHES  . Ropinirole     Pt stated, "I am getting cramps in my legs - especially at night"  . Aspartame And Phenylalanine Nausea And Vomiting    HEADACHES  . Ibuprofen Other (See Comments)    HEADACHES  . Trazodone And Nefazodone Other (See Comments)    Hallucinations     Past Medical History:  Diagnosis Date  . Anxiety   . Arthritis   . Bipolar disorder ([place])   . Broken jaw ([place])   . Broken wrist   . Chronic back pain   . Claudication ([place])    a. 12/2013 ABI's: R 0.97, L 0.94.  [name] Colon cancer ([place])   . COPD (chronic obstructive pulmonary disease) ([place])   . Depression   . Diabetic Charcot's foot ([place])   . Diabetic foot ulcer ([facility])    chronic left foot ulcer , great toe/  hx recurrent foot ulcer bilaterally  . DJD (degenerative joint disease)   . Edema of both lower extremities   . Episode of memory loss   . Family history of bladder cancer   . Family history of colon cancer   . Family history of kidney cancer   . GERD (gastroesophageal reflux disease)   . Headache(784.0)   . Hiatal hernia   . History of colon cancer, stage I dx 02-26-2015--- oncologist-- dr [name]--- per last note no recurrence   05-16-2015 s/p  Laparoscopic sigmoid colectomy w/ node bx's (negative nodes per path)  Stage I (pT1,N0,M0) Grade 2  . 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   per MRI multiple small strokes post cardiac  cath. --  residual mild memory loss  . History of methicillin resistant staphylococcus aureus (MRSA)   . Hypercholesterolemia   . Hypertension   . Insomnia   . Insulin dependent type 2 diabetes mellitus, uncontrolled ([place]) dx 2004   endocrinologist-  dr Dwyane Dee-- last A1c 8.8 in Aug2018:  pt is noncompliant w/ diet, states does note eat breakfast , her first main meal in afternoon  . Neurogenic bladder   . Neuropathy, diabetic ([place])    hands and feet  . OSA (obstructive sleep apnea)    cpap intolerant  . Personality disorder ([place])    . Restless leg syndrome   . SOB (shortness of breath) on exertion   . Stroke ([place])   . SUI (stress urinary incontinence, female) S/P SLING 12-29-2011    Past Surgical History:  Procedure Laterality Date  . AMPUTATION Right 04/13/2018   Procedure: RIGHT BELOW KNEE AMPUTATION;  Surgeon: Newt Minion, MD;  Location: [place];  Service: Orthopedics;  Laterality: Right;  . AMPUTATION Left 10/26/2018   Procedure: LEFT FOOT FIRST RAY AMPUTATION;  Surgeon: Newt Minion, MD;  Location: [place];  Service: Orthopedics;  Laterality: Left;  . AMPUTATION TOE Left 05/10/2019   Procedure: AMPUTATION 2nd left TOE WITH METATARSAL;  Surgeon: Landis Martins, DPM;  Location: [place];  Service: Podiatry;  Laterality: Left;  . ANTERIOR CERVICAL DECOMP/DISCECTOMY FUSION  2000   C5 - 7  . APPLICATION OF WOUND VAC Left 10/26/2018   Procedure: APPLICATION OF WOUND VAC;  Surgeon: Newt Minion, MD;  Location: [place];  Service: Orthopedics;  Laterality: Left;  . BACK SURGERY    . CARDIAC CATHETERIZATION  05-22-2008   DR SKAINS   NO SIGNIFECANT CAD/ NORMAL LV/  EF 65%/  NO WALL MOTION ABNORMALITIES  . CARPAL TUNNEL RELEASE Right 04-25-2013  . CATARACT EXTRACTION Right 10/24/2018  . [place]; 1992  . COLON SURGERY    . COLONOSCOPY    . CYSTO N/A 04/30/2013   Procedure: CYSTOSCOPY;  Surgeon: Reece Packer, MD;  Location: [facility];  Service: Urology;  Laterality: N/A;  . CYSTOSCOPY MACROPLASTIQUE IMPLANT N/A 02/06/2018   Procedure: CYSTOSCOPY MACROPLASTIQUE IMPLANT;  Surgeon: Bjorn Loser, MD;  Location: [facility];  Service: Urology;  Laterality: N/A;  . CYSTOSCOPY WITH INJECTION  05/04/2012   Procedure: CYSTOSCOPY WITH INJECTION;  Surgeon: Reece Packer, MD;  Location: [place];  Service: Urology;  Laterality: N/A;  MACROPLASTIQUE INJECTION  . CYSTOSCOPY WITH INJECTION  08/28/2012   Procedure: CYSTOSCOPY WITH INJECTION;  Surgeon: Reece Packer, MD;  Location:  [facility];  Service: Urology;  Laterality: N/A;  cysto and macroplastique   . FOOT SURGERY Bilateral    "related to [place]"  . HERNIA REPAIR  ?1996   "stomach"  . I & D EXTREMITY Left 10/25/2018   Procedure: IRRIGATION AND DEBRIDEMENT GREAT TOE;  Surgeon: Meredith Pel, MD;  Location: [place];  Service: Orthopedics;  Laterality: Left;  . INCISION AND DRAINAGE Left 10/25/2018   GREAT TOE  . INCISION AND DRAINAGE OF WOUND Right 04/11/2018   Procedure: IRRIGATION AND DEBRIDEMENT WOUND right foot and right ankle;  Surgeon: Evelina Bucy, DPM;  Location: [facility];  Service: Podiatry;  Laterality: Right;  . KNEE ARTHROSCOPY Left   . KNEE ARTHROSCOPY W/ ALLOGRAFT IMPANT Left    graft x 2  . KNEE SURGERY     TOTAL 8 SURG'S  . LAPAROSCOPIC SIGMOID COLECTOMY N/A  04/22/2015   Procedure: LAPAROSCOPIC HAND ASSISTED SIGMOID COLECTOMY;  Surgeon: Erroll Luna, MD;  Location: [place];  Service: General;  Laterality: N/A;  . LEFT HEART CATHETERIZATION WITH CORONARY ANGIOGRAM N/A 02/12/2014   Procedure: LEFT HEART CATHETERIZATION WITH CORONARY ANGIOGRAM;  Surgeon: Candee Furbish, MD;  Location: [facility] C[facility];  Service: Cardiovascular;  Laterality: N/A;   No angiographically significant CAD; normal LVSF, LVEDP 49mmHg,  EF 55% (new finding ef 30% myoview 01-08-2014)  . LUMBAR FUSION    . MANDIBLE FRACTURE SURGERY    . MULTIPLE LAPAROSCOPIES FOR ENDOMETRIOSIS    . PUBOVAGINAL SLING  12/29/2011   Procedure: Gaynelle Arabian;  Surgeon: Reece Packer, MD;  Location: [facility];  Service: Urology;  Laterality: N/A;  cysto and sparc sling   . PUBOVAGINAL SLING N/A 04/30/2013   Procedure: REMOVAL OF VAGINAL MESH;  Surgeon: Reece Packer, MD;  Location[facility];  Service: Urology;  Laterality: N/A;  . RECONSTURCTION OF CONGENITAL UTERUS ANOMALY  1983  . REPEAT RECONSTRUCTION ACL LEFT KNEE/ SCREWS REMOVED  03-28-2000   CADAVER GRAFT  . TOTAL ABDOMINAL HYSTERECTOMY  1997   w/BSO   . TRANSTHORACIC ECHOCARDIOGRAM  02/13/2014   ef 45%, hypokinesis base inferior and base inferolateral walls  . UPPER GI ENDOSCOPY    . WOUND DEBRIDEMENT Right 09/05/2016   Procedure: DEBRIDEMENT WOUND WITH GRAFT RIGHT FOOT;  Surgeon: Landis Martins, DPM;  Location: [place];  Service: Podiatry;  Laterality: Right;  . WRIST FRACTURE SURGERY      Family History  Problem Relation Age of Onset  . Hypertension Mother   . Diabetes Mother   . Cancer - Other Mother        lymphoma   . Cancer - Other Father        lung, bladder cancer   . Heart attack Father   . Other Father        Lynch syndrome  . Cancer - Other Brother        bladder cancer   . Bladder Cancer Paternal Aunt        Lynch syndrome  . Other Paternal Uncle        Lynch syndrome  . Other Cousin        Lynch syndrome    Social History:  reports that she has been smoking cigarettes. She has a 92.00 pack-year smoking history. She has never used smokeless tobacco. She reports current alcohol use. She reports previous drug use.  Review of Systems -   HYPOTHYROIDISM:  She had routine lab work done by her PCP and her TSH was 8.5 She does have fatigue which is not new, TSH was normal last year  Lab Results  Component Value Date   TSH 8.51 (A) 06/24/2020   TSH 2.34 12/03/2019   TSH 2.19 03/19/2018   FREET4 0.65 03/19/2018   FREET4 1.02 01/01/2016   FREET4 0.84 11/05/2014    She has history of high triglycerides previously treated with fenofibrate and is not on any treatment    Lab Results  Component Value Date   CHOL 113 06/24/2020   HDL 45 06/24/2020   LDLCALC 38 06/24/2020   LDLDIRECT 57.0 08/22/2019   TRIG 241 (A) 06/24/2020   CHOLHDL 3 08/22/2019     ELECTROLYTES: These have been normal along with renal function, currently on Jardiance  Lab Results  Component Value Date   CREATININE 0.8 06/24/2020   BUN 8 06/24/2020   NA 137 06/24/2020   K 4.0  06/24/2020   CL 103 06/24/2020   CO2 25 (A) 06/24/2020     Painful neuropathy: She has had persistent symptoms including leg pains tingling and numbness as well as difficulty with balance  Has been on gabapentin but does not get enough relief with this   Did not want Lyrica because of potential for weight gain   Foot exams: Last showed absent pedal pulses and Absent distal sensation  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 Gershon Crane: Done in 1/19  HYPERTENSION: followed by cardiologist, has systolic heart failure with grade 2 diastolic dysfunction. Treated with low-dose losartan and bisoprolol 5 mg  Also on Jardiance  BP Readings from Last 3 Encounters:  07/08/20 140/68  06/11/20 135/70  03/09/20 140/70        Examination:   BP 140/68 (BP Location: Left Arm, Patient Position: Sitting)   Pulse 86   Ht 5\' 5"  (1.651 m)   Wt 188 lb 6.4 oz (85.5 kg)   SpO2 95%   BMI 31.35 kg/m   Body mass index is 31.35 kg/m.   Thyroid not palpable Biceps reflexes difficult to elicit No skin dryness  Assesment/Plan:   HYPOTHYROIDISM:   She may have mild hypothyroidism and not clear if she is symptomatic because she has tendency to fatigue anyway No goiter Her TSH is 8.5 compared to normal before Discussed that she may have mild autoimmune thyroid disease and would be reasonable to try 25 mcg levothyroxine empirically to see if she is subjectively feels better This likely is not going to increase her weight loss unless she starts getting more active with better energy level  DIABETES  type 2, with mild obesity, insulin requiring  See history of present illness for detailed discussion of current diabetes management, blood sugar patterns and problems identified  Her A1c is 7% done by PCP recently  Although on her last visit her blood sugars were well over 200 at night or with her main meal she is having better control now with her cutting back on carbohydrates and simple sugars She did not  start Ozempic as directed as she is unable to remember when to take it, was given a sample Also not taking NovoLog as instructed Does take Jardiance as before As above her blood sugar monitoring is very inadequate as she forgets to check her sugars even though she has a sensor on all the time   Recommendations:  To check blood sugars 3-4 times a day and discussed the need to do this to help ascertain her blood sugar patterns and adjust her insulin  If her blood sugars tend to be higher consistently after meals may consider NovoLog  In the meantime she will start writing down on the calendar when she needs to take her Ozempic and start with 0.25 mg weekly for 4 injections and then 0.5 weekly  No need for NovoLog as yet  If her blood sugars in the mornings are below 100 she will need to start reducing BASAGLAR and she will need to call us about this  Since she cannot tolerate a very low carbohydrate diet with only 20 g of carbs she can increase it to up to 40 g daily at least  She will be seen by the dietitian    Dyslipidemia: Still has relatively high triglycerides and these may improve with some weight loss, better sugar control but may also consider fenofibrate again      There  are no Patient Instructions on file for this visit.         Elayne Snare 07/08/2020, 3:42 PM

## 2020-07-08 NOTE — Patient Instructions (Addendum)
Check blood sugars on waking up 7 days a week  Also check blood sugars about 2 hours after meals and do this after different meals by rotation  Recommended blood sugar levels on waking up are 90-130 and about 2 hours after meal is 130-160  Please bring your blood sugar monitor to each visit, thank you  Start OZEMPIC injections by dialing 0.25 mg on the pen as shown once weekly on the same day of the week.   You may inject in the sides of the stomach, outer thigh or arm as indicated in the brochure given. If you have any difficulties using the pen see the video at CompPlans.co.za  You will feel fullness of the stomach with starting the medication and should try to keep the portions at meals small.  You may experience nausea in the first few days which usually gets better over time    After 4 weeks increase the dose to 0.5 mg weekly  Elta [place] it calender

## 2020-07-23 ENCOUNTER — Ambulatory Visit: Payer: Medicare Other | Admitting: Endocrinology

## 2020-08-25 ENCOUNTER — Telehealth: Payer: Self-pay | Admitting: Endocrinology

## 2020-08-25 NOTE — Telephone Encounter (Signed)
[name] with [employer] request to be called at ph# [phone] for status of fax the above sent to Dr. Dwyane Dee for CGM supplies.

## 2020-08-26 NOTE — Telephone Encounter (Signed)
Corene Cornea from [employer] wants to know what the status of this is- Corene Cornea asked to be called at [phone] when we have completed this request

## 2020-09-01 NOTE — Telephone Encounter (Signed)
[name] from [employer] wants to know if we received the fax for the CGM Meter. He would like a call back. He said it is urgent.   [phone]

## 2020-09-02 NOTE — Telephone Encounter (Signed)
Form has been received According to form and per Dr. Dwyane Dee, the form has been filled out before and records are not showing DME criteria met.  Corene Cornea has been made aware and a return fax is being sent per Jason's request

## 2020-09-08 ENCOUNTER — Ambulatory Visit (INDEPENDENT_AMBULATORY_CARE_PROVIDER_SITE_OTHER): Payer: Medicare Other | Admitting: Endocrinology

## 2020-09-08 ENCOUNTER — Other Ambulatory Visit: Payer: Self-pay

## 2020-09-08 ENCOUNTER — Encounter: Payer: Self-pay | Admitting: Endocrinology

## 2020-09-08 VITALS — BP 132/74 | HR 92 | Wt 199.0 lb

## 2020-09-08 DIAGNOSIS — Z794 Long term (current) use of insulin: Secondary | ICD-10-CM | POA: Diagnosis not present

## 2020-09-08 DIAGNOSIS — E1165 Type 2 diabetes mellitus with hyperglycemia: Secondary | ICD-10-CM | POA: Diagnosis not present

## 2020-09-08 MED ORDER — VICTOZA 18 MG/3ML ~~LOC~~ SOPN: PEN_INJECTOR | SUBCUTANEOUS | 1 refills | Status: DC

## 2020-09-08 NOTE — Progress Notes (Signed)
Patient ID: [name], female   DOB: 03-09-1966, 54 y.o.   MRN: [id number]   Reason for Appointment: Diabetes follow-up    History of Present Illness:   Diagnosis: Type 2 diabetes mellitus, date of diagnosis: 2004.  PAST history: She has been treated mostly with insulin since about a year after diagnosis. She has had difficulty with consistent compliance with diet and also compliance with self care including glucose monitoring over the years. She had been mostly treated with basal insulin. Also had been tried on mealtime insulin but she would be noncompliant with this. Was tried on Prandin for mealtime control but difficult to judge efficacy because of lack of postprandial monitoring. Was given Victoza to start in 2011 but did not follow up after this.She was tried on premixed insulin but this did not help her control, mostly because of noncompliance with the doses. She had been using the V- go pump and had better control initially and was better compliant with the daily routine and boluses. She has had frequent education visits also. She stopped using her V.-go pump in 6/15 because of discomfort at the site of application, mostly was using this on her abdomen Also did not improve with a trial of Victoza   RECENT history:    INSULIN dose: Basaglar 18units in am.  NovoLog: Not taking  Non-insulin hypoglycemic drugs: Metformin ER 1500 mg daily, Jardiance 25 mg daily at 8 pm  Her A1c is last month 6.7 and repeated by PCP was 7%  Current management, problems identified:   She did get Ozempic prescription on the last visit but still has not taken it despite reminders on the second visit for this reason  She says she would forget to take it because of the weekly schedule even though she was told to keep a written reminder  Also is forgetting to check her blood sugars and does not check it every day despite using the freestyle libre  With this only 25% of her last 2 weeks data  is available  She has significant barriers of HYPER glycemia which may occur at different times including overnight sometimes  However HIGHEST blood sugars overall are between 4 PM-10 PM based on her food intake  She is avoiding sweet drinks most of the time now but even appears to have high sugars when she has coffee with sugar and creamer  She has been refusing to take NovoLog in the past because she wants some medication for weight loss  Unable to exercise  Her weight has gone up 11 pounds  CGM use % of time  25  2-week average/SD  183+/-35  Time in range       54%  % Time Above 180  46  % Time above 250   % Time Below 70 0     PRE-MEAL Fasting Lunch Dinner Bedtime Overall  Glucose range:       Averages:  138  158  240  234  183   POST-MEAL PC Breakfast PC Lunch PC Dinner  Glucose range:     Averages:  154  196  257   Previous data:   CGM use % of time  39  2-week average/SD  126  Time in range       95%  % Time Above 180 4  % Time above 250   % Time Below 70 0     PRE-MEAL Fasting Lunch Dinner Bedtime Overall  Glucose range:  Averages:  133  122  128   126   POST-MEAL PC Breakfast PC Lunch PC Dinner  Glucose range:     Averages:    133      Food preferences: eating Mostly 1 meal per day around 6-8 pm, variable intake, sandwiches at times or otherwise snacks,  Physical activity: exercise: Minimal.  Certified Diabetes Educator visit: Most recent:, 2/14.     Wt Readings from Last 3 Encounters:  09/08/20 199 lb (90.3 kg)  07/08/20 188 lb 6.4 oz (85.5 kg)  06/11/20 189 lb (85.7 kg)   DM labs:   Lab Results  Component Value Date   HGBA1C 7.0 06/24/2020   HGBA1C 6.7 (A) 06/11/2020   HGBA1C 5.6 03/09/2020   Lab Results  Component Value Date   MICROALBUR <0.7 12/03/2019   [place] 38 06/24/2020   CREATININE 0.8 06/24/2020     Other active problems: See review of systems    Allergies as of 09/08/2020      Reactions   Latex Itching,  Rash, Other (See Comments)   Pt states she cannot use condoms - cause an infection.  Use of latex on skin is okay for short period of time.  Tape causes rash   Sweetening Enhancer [flavoring Agent] Nausea And Vomiting, Other (See Comments)   HEADACHES   Ropinirole    Pt stated, "I am getting cramps in my legs - especially at night"   Aspartame And Phenylalanine Nausea And Vomiting   HEADACHES   Ibuprofen Other (See Comments)   HEADACHES   Trazodone And Nefazodone Other (See Comments)   Hallucinations      Medication List       Accurate as of September 08, 2020  9:54 PM. If you have any questions, ask your nurse or doctor.        STOP taking these medications   Ozempic (0.25 or 0.5 MG/DOSE) 2 MG/1.5ML Sopn Generic drug: Semaglutide(0.25 or 0.5MG /DOS) Stopped by: Elayne Snare, MD     TAKE these medications   aspirin-acetaminophen-caffeine [phone] MG tablet Commonly known as: EXCEDRIN MIGRAINE Take 2 tablets by mouth every 6 (six) hours as needed for headache.   atorvastatin 40 MG tablet Commonly known as: LIPITOR Take 40 mg by mouth at bedtime.   Basaglar KwikPen 100 UNIT/ML Inject 0.22 mLs (22 Units total) into the skin daily. What changed: how much to take   BD Pen Needle Nano 2nd Gen 32G X 4 MM Misc Generic drug: Insulin Pen Needle 1 each by Does not apply route daily. Use to inject insulin daily.   bisoprolol 5 MG tablet Commonly known as: ZEBETA Take 1 tablet (5 mg total) by mouth daily.   diazepam 5 MG tablet Commonly known as: VALIUM Take 5 mg by mouth 2 (two) times daily.   esomeprazole 40 MG capsule Commonly known as: NEXIUM Take 40 mg by mouth daily.   fenofibrate 48 MG tablet Commonly known as: TRICOR Take 48 mg by mouth daily.   FreeStyle Libre 14 Day Sensor Misc 2 each by Does not apply route every 14 (fourteen) days. Use 1 sensor every 14 days to monitor blood sugar.   gabapentin 600 MG tablet Commonly known as: NEURONTIN Take 600 mg by  mouth 3 (three) times daily.   glucose blood test strip Commonly known as: FREESTYLE TEST STRIPS USE TO CHECK BLOOD SUGARS DAILY. DX CODE E11.65   Hetlioz 20 MG Caps Generic drug: Tasimelteon Take 1 capsule by mouth at bedtime.   Ingrezza 80  MG Caps Generic drug: Valbenazine Tosylate Take 80 mg by mouth at bedtime.   Jardiance 25 MG Tabs tablet Generic drug: empagliflozin TAKE 1 TABLET BY MOUTH EVERY DAY   levothyroxine 25 MCG tablet Commonly known as: Synthroid Take 1 tablet (25 mcg total) by mouth daily before breakfast.   Linzess 72 MCG capsule Generic drug: linaclotide as needed.   losartan 25 MG tablet Commonly known as: COZAAR Take 1 tablet (25 mg total) by mouth daily.   metFORMIN 750 MG 24 hr tablet Commonly known as: GLUCOPHAGE-XR Take 1 tablet (750 mg total) by mouth 2 (two) times daily.   oxyCODONE-acetaminophen 10-325 MG tablet Commonly known as: PERCOCET   pantoprazole 40 MG tablet Commonly known as: PROTONIX   QUEtiapine 300 MG tablet Commonly known as: SEROQUEL Take 600 mg by mouth at bedtime. One hour before bed   Victoza 18 MG/3ML Sopn Generic drug: liraglutide Start 0.6mg  SQ once a day for 7 days, then increase to 1.2mg  once a day Started by: Elayne Snare, MD   Vraylar 6 MG Caps Generic drug: Cariprazine HCl Take 6 mg by mouth at bedtime.       Allergies:  Allergies  Allergen Reactions  . Latex Itching, Rash and Other (See Comments)    Pt states she cannot use condoms - cause an infection.  Use of latex on skin is okay for short period of time.  Tape causes rash  . Sweetening Enhancer [Flavoring Agent] Nausea And Vomiting and Other (See Comments)    HEADACHES  . Ropinirole     Pt stated, "I am getting cramps in my legs - especially at night"  . Aspartame And Phenylalanine Nausea And Vomiting    HEADACHES  . Ibuprofen Other (See Comments)    HEADACHES  . Trazodone And Nefazodone Other (See Comments)    Hallucinations     Past  Medical History:  Diagnosis Date  . Anxiety   . Arthritis   . Bipolar disorder ([place])   . Broken jaw ([place])   . Broken wrist   . Chronic back pain   . Claudication ([place])    a. 12/2013 ABI's: R 0.97, L 0.94.  [name] Colon cancer ([place])   . COPD (chronic obstructive pulmonary disease) ([place])   . Depression   . Diabetic Charcot's foot ([place])   . Diabetic foot ulcer ([facility])    chronic left foot ulcer , great toe/  hx recurrent foot ulcer bilaterally  . DJD (degenerative joint disease)   . Edema of both lower extremities   . Episode of memory loss   . Family history of bladder cancer   . Family history of colon cancer   . Family history of kidney cancer   . GERD (gastroesophageal reflux disease)   . Headache(784.0)   . Hiatal hernia   . History of colon cancer, stage I dx 02-26-2015--- oncologist-- dr [name]--- per last note no recurrence   05-16-2015 s/p  Laparoscopic sigmoid colectomy w/ node bx's (negative nodes per path)  Stage I (pT1,N0,M0) Grade 2  . 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   per MRI multiple small strokes post cardiac cath. --  residual mild memory loss  . History of methicillin resistant staphylococcus aureus (MRSA)   . Hypercholesterolemia   . Hypertension   . Insomnia   . Insulin dependent type 2 diabetes mellitus, uncontrolled ([place]) dx 2004   endocrinologist-  dr Dwyane Dee-- last A1c 8.8 in Aug2018:  pt is noncompliant w/  diet, states does note eat breakfast , her first main meal in afternoon  . Neurogenic bladder   . Neuropathy, diabetic ([place])    hands and feet  . OSA (obstructive sleep apnea)    cpap intolerant  . Personality disorder ([place])   . Restless leg syndrome   . SOB (shortness of breath) on exertion   . Stroke ([place])   . SUI (stress urinary incontinence, female) S/P SLING 12-29-2011    Past Surgical History:  Procedure Laterality Date  . AMPUTATION Right 04/13/2018   Procedure: RIGHT BELOW KNEE AMPUTATION;  Surgeon: Newt Minion, MD;  Location: [facility];  Service: Orthopedics;  Laterality: Right;  . AMPUTATION Left 10/26/2018   Procedure: LEFT FOOT FIRST RAY AMPUTATION;  Surgeon: Newt Minion, MD;  Location: [facility];  Service: Orthopedics;  Laterality: Left;  . AMPUTATION TOE Left 05/10/2019   Procedure: AMPUTATION 2nd left TOE WITH METATARSAL;  Surgeon: Landis Martins, DPM;  Location: [facility];  Service: Podiatry;  Laterality: Left;  . ANTERIOR CERVICAL DECOMP/DISCECTOMY FUSION  2000   C5 - 7  . APPLICATION OF WOUND VAC Left 10/26/2018   Procedure: APPLICATION OF WOUND VAC;  Surgeon: Newt Minion, MD;  Location: [facility];  Service: Orthopedics;  Laterality: Left;  . BACK SURGERY    . CARDIAC CATHETERIZATION  05-22-2008   DR SKAINS   NO SIGNIFECANT CAD/ NORMAL LV/  EF 65%/  NO WALL MOTION ABNORMALITIES  . CARPAL TUNNEL RELEASE Right 04-25-2013  . CATARACT EXTRACTION Right 10/24/2018  . [place]; 1992  . COLON SURGERY    . COLONOSCOPY    . CYSTO N/A 04/30/2013   Procedure: CYSTOSCOPY;  Surgeon: Reece Packer, MD;  Location: [facility];  Service: Urology;  Laterality: N/A;  . CYSTOSCOPY MACROPLASTIQUE IMPLANT N/A 02/06/2018   Procedure: CYSTOSCOPY MACROPLASTIQUE IMPLANT;  Surgeon: Bjorn Loser, MD;  Location[facility];  Service: Urology;  Laterality: N/A;  . CYSTOSCOPY WITH INJECTION  05/04/2012   Procedure: CYSTOSCOPY WITH INJECTION;  Surgeon: Reece Packer, MD;  Location: [facility];  Service: Urology;  Laterality: N/A;  MACROPLASTIQUE INJECTION  . CYSTOSCOPY WITH INJECTION  08/28/2012   Procedure: CYSTOSCOPY WITH INJECTION;  Surgeon: Reece Packer, MD;  Location: [facility];  Service: Urology;  Laterality: N/A;  cysto and macroplastique   . FOOT SURGERY Bilateral    "related to [place]"  . HERNIA REPAIR  ?1996   "stomach"  . I & D EXTREMITY Left 10/25/2018   Procedure: IRRIGATION AND DEBRIDEMENT GREAT TOE;  Surgeon: Meredith Pel,  MD;  Location: [facility];  Service: Orthopedics;  Laterality: Left;  . INCISION AND DRAINAGE Left 10/25/2018   GREAT TOE  . INCISION AND DRAINAGE OF WOUND Right 04/11/2018   Procedure: IRRIGATION AND DEBRIDEMENT WOUND right foot and right ankle;  Surgeon: Evelina Bucy, DPM;  Location: [facility];  Service: Podiatry;  Laterality: Right;  . KNEE ARTHROSCOPY Left   . KNEE ARTHROSCOPY W/ ALLOGRAFT IMPANT Left    graft x 2  . KNEE SURGERY     TOTAL 8 SURG'S  . LAPAROSCOPIC SIGMOID COLECTOMY N/A 04/22/2015   Procedure: LAPAROSCOPIC HAND ASSISTED SIGMOID COLECTOMY;  Surgeon: Erroll Luna, MD;  Location: [facility];  Service: General;  Laterality: N/A;  . LEFT HEART CATHETERIZATION WITH CORONARY ANGIOGRAM N/A 02/12/2014   Procedure: LEFT HEART CATHETERIZATION WITH CORONARY ANGIOGRAM;  Surgeon: Candee Furbish, MD;  Location[facility];  Service: Cardiovascular;  Laterality: N/A;   No angiographically significant CAD; normal LVSF, LVEDP 39mmHg,  EF 55% (new finding ef 30% myoview 01-08-2014)  . LUMBAR FUSION    . MANDIBLE FRACTURE SURGERY    . MULTIPLE LAPAROSCOPIES FOR ENDOMETRIOSIS    . PUBOVAGINAL SLING  12/29/2011   Procedure: Gaynelle Arabian;  Surgeon: Reece Packer, MD;  Location: [facility];  Service: Urology;  Laterality: N/A;  cysto and sparc sling   . PUBOVAGINAL SLING N/A 04/30/2013   Procedure: REMOVAL OF VAGINAL MESH;  Surgeon: Reece Packer, MD;  Location[facility];  Service: Urology;  Laterality: N/A;  . RECONSTURCTION OF CONGENITAL UTERUS ANOMALY  1983  . REPEAT RECONSTRUCTION ACL LEFT KNEE/ SCREWS REMOVED  03-28-2000   CADAVER GRAFT  . TOTAL ABDOMINAL HYSTERECTOMY  1997   w/BSO  . TRANSTHORACIC ECHOCARDIOGRAM  02/13/2014   ef 45%, hypokinesis base inferior and base inferolateral walls  . UPPER GI ENDOSCOPY    . WOUND DEBRIDEMENT Right 09/05/2016   Procedure: DEBRIDEMENT WOUND WITH GRAFT RIGHT FOOT;  Surgeon: Landis Martins, DPM;  Location: [place] [facility];  Service: Podiatry;   Laterality: Right;  . WRIST FRACTURE SURGERY      Family History  Problem Relation Age of Onset  . Hypertension Mother   . Diabetes Mother   . Cancer - Other Mother        lymphoma   . Cancer - Other Father        lung, bladder cancer   . Heart attack Father   . Other Father        Lynch syndrome  . Cancer - Other Brother        bladder cancer   . Bladder Cancer Paternal Aunt        Lynch syndrome  . Other Paternal Uncle        Lynch syndrome  . Other Cousin        Lynch syndrome    Social History:  reports that she has been smoking cigarettes. She has a 92.00 pack-year smoking history. She has never used smokeless tobacco. She reports current alcohol use. She reports previous drug use.  Review of Systems -   HYPOTHYROIDISM:  She had routine lab work done by her PCP and her TSH was 8.5 She was started on levothyroxine 25 mcg daily but did not come for her labs  Lab Results  Component Value Date   TSH 8.51 (A) 06/24/2020   TSH 2.34 12/03/2019   TSH 2.19 03/19/2018   FREET4 0.65 03/19/2018   FREET4 1.02 01/01/2016   FREET4 0.84 11/05/2014    She has history of high triglycerides treated with fenofibrate    Lab Results  Component Value Date   CHOL 113 06/24/2020   HDL 45 06/24/2020   LDLCALC 38 06/24/2020   LDLDIRECT 57.0 08/22/2019   TRIG 241 (A) 06/24/2020   CHOLHDL 3 08/22/2019     Painful neuropathy: She has had persistent symptoms including leg pains tingling and numbness as well as difficulty with balance  Has been on gabapentin but does not get enough relief with this   Did not want Lyrica because of potential for weight gain   Foot exams: Last showed absent pedal pulses and Absent distal sensation  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 Gershon Crane: Done in 1/19  HYPERTENSION: followed by cardiologist, has systolic heart failure with grade 2 diastolic dysfunction. Treated with low-dose losartan  and bisoprolol  5 mg  Also on Jardiance  BP Readings from Last 3 Encounters:  09/08/20 132/74  07/08/20 140/68  06/11/20 135/70        Examination:   BP 132/74   Pulse 92   Wt 199 lb (90.3 kg)   SpO2 98%   BMI 33.12 kg/m   Body mass index is 33.12 kg/m.     Assesment/Plan:   DIABETES  type 2, with mild obesity, insulin requiring  See history of present illness for detailed discussion of current diabetes management, blood sugar patterns and problems identified  Her A1c is 7% done by PCP most recently  Her blood sugars are overall poorly controlled with average 183 and mostly postprandial hyperglycemia She has gained weight She has been given Ozempic to use a couple of times but still has not started because of difficulty remembering her weekly dose   Recommendations:  Needs to monitor 3-4 times a day with her freestyle libre instead of randomly, currently only has 25% of her data available  Trial of VICTOZA  She will start with 0.6 mg for the first week and then 1.2 mg  Showed her how the dosage will be measured on the pen  She will take this at the same time as her Basaglar  Avoid any high carbohydrate foods and simple sugars   Mild hypothyroidism: She is on 25 mcg of levothyroxine but needs to have her labs done     Patient Instructions  Start VICTOZA injection as shown once daily at the same time of the day.   Dial the dose to 0.6 mg on the pen for the first week.   You may inject in the stomach, thigh or arm. You may experience nausea in the first few days which usually goes away.   You will feel fullness of the stomach with starting the medication and should try to keep the portions at meals small.  After 1 week increase the dose to 1.2mg  daily if no nausea present.    If any questions or concerns are present call the office or the [place] helpline at [phone]. Visit [email or web address].wall.info/ for more useful  information  Check sugar 4x daily          Elayne Snare 09/08/2020, 9:54 PM

## 2020-09-08 NOTE — Patient Instructions (Addendum)
Start VICTOZA injection as shown once daily at the same time of the day.   Dial the dose to 0.6 mg on the pen for the first week.   You may inject in the stomach, thigh or arm. You may experience nausea in the first few days which usually goes away.   You will feel fullness of the stomach with starting the medication and should try to keep the portions at meals small.  After 1 week increase the dose to 1.2mg  daily if no nausea present.    If any questions or concerns are present call the office or the [place] helpline at [phone]. Visit [email or web address].wall.info/ for more useful information  Check sugar 4x daily

## 2020-09-10 ENCOUNTER — Other Ambulatory Visit: Payer: Self-pay

## 2020-09-10 ENCOUNTER — Encounter: Payer: Medicare Other | Attending: Endocrinology | Admitting: Dietician

## 2020-09-10 ENCOUNTER — Encounter: Payer: Self-pay | Admitting: Dietician

## 2020-09-10 DIAGNOSIS — E1165 Type 2 diabetes mellitus with hyperglycemia: Secondary | ICD-10-CM | POA: Insufficient documentation

## 2020-09-10 NOTE — Patient Instructions (Addendum)
Sit and Be Fit on You Tube Consider asking your doctor about Physical Therapy  Take Vitamin D3 daily (2,000 units or as recommended by your MD)  Take your medication as prescribed  Take the Levothyroxine 30 minutes to 1 hour prior to your meal  Great job on changing your beverages!  Eat Breakfast, Lunch, and Dinner  Choose 2-3 carb choices per meal  1/2 your plate should be non starchy vegetables  Little fat - this is high in calories  Choose small portions lean meat, 1 egg, peanut butter or cheese per meal.

## 2020-09-10 NOTE — Progress Notes (Signed)
Diabetes Self-Management Education  Visit Type: First/Initial  Appt. Start Time: 1430 Appt. End Time: 1610  09/10/2020  [name], identified by name and date of birth, is a 54 y.o. female with a diagnosis of Diabetes: Type 2.   ASSESSMENT Patient is here with [name] her personal care giver.  She does not know the purpose of the visit today but would like to lose weight.  History includes Type 2 Diabetes (1995 per patient ), bipolar, neuropathy, right BKA,HTN, HLD, GERD, history of colon cancer, OSA "can't wear c-pap"- smothering, vitamin D deficiency Diet:  Eats 1 meal a day and increased snacks before and after dinner. Allergies include artificial sweeteners particularly aspartame Medications include:  Basaglar 18-22 units q HS, Jardiance, Victoza, Metformin XR.  She states that she does not take her medication at times as she is aggrivated with her health although states that she takes the insulin, metformin, and mediation for bipolar.  Labs noted to include:  A1C 7%, Cholesterol 113, HDL 45, LDL 38, Triglycerides 241, TSH 8.51, vitamin D 21, Vitamin B-12 321. 06/24/2020. Discussed role of thyroid on weight and importance of taking her medication. She uses a [employer] She vapes.  Weight hx: 199 lbs per patient. 188 lbs 07/08/2020 167 lbs  UBW "a few months ago"  Patient lives alone.  [name] is with her for 2 hours and 15 minutes daily and also lives next door.  Patient does drive.  She does most of her own cooking.  [name] and [name] share shopping.  [name] states that she is going to help her with more of her care to include assisting [name] with her medication. She is on disability since 1994.  Height 5\' 5"  (1.651 m), weight 199 lb (90.3 kg). Body mass index is 33.12 kg/m.   Diabetes Self-Management Education - 09/10/20 1450      Visit Information   Visit Type First/Initial      Initial Visit   Diabetes Type Type 2    Are you currently following a meal plan?  No    Are you taking your medications as prescribed? Yes    Date Diagnosed 1996      Health Coping   How would you rate your overall health? Poor      Psychosocial Assessment   Patient Belief/Attitude about Diabetes Defeat/Burnout   "I am sick of it!"  I HATE it!"   Self-care barriers Debilitated state due to current medical condition;Lack of material resources;Impaired vision    Self-management support Doctor's office    Other persons present Patient;Other (comment)   caregiver   Patient Concerns Nutrition/Meal planning;Weight Control;Glycemic Control    Special Needs None    Preferred Learning Style No preference indicated    Learning Readiness Ready    How often do you need to have someone help you when you read instructions, pamphlets, or other written materials from your doctor or pharmacy? 5 - Always    What is the last grade level you completed in school? 6th grade      Pre-Education Assessment   Patient understands the diabetes disease and treatment process. Needs Instruction    Patient understands incorporating nutritional management into lifestyle. Needs Instruction    Patient undertands incorporating physical activity into lifestyle. Needs Instruction    Patient understands using medications safely. Needs Instruction    Patient understands monitoring blood glucose, interpreting and using results Needs Instruction    Patient understands prevention, detection, and treatment of acute complications. Needs Instruction  Patient understands prevention, detection, and treatment of chronic complications. Needs Instruction    Patient understands how to develop strategies to address psychosocial issues. Needs Instruction    Patient understands how to develop strategies to promote health/change behavior. Needs Instruction      Complications   Last HgB A1C per patient/outside source 7 %   06/24/2020   How often do you check your blood sugar? 3-4 times / week    Fasting Blood glucose  range (mg/dL) 130-179    Number of hypoglycemic episodes per month 0    Number of hyperglycemic episodes per week 14    Can you tell when your blood sugar is high? No    Have you had a dilated eye exam in the past 12 months? Yes    Have you had a dental exam in the past 12 months? Yes    Are you checking your feet? No      Dietary Intake   Breakfast skips    Snack (morning) none    Lunch skips    Snack (afternoon) cheese, 10 saltine crackers, chocolate candy    Dinner fish, rice and gravy, lima beans ([employer])   8   Snack (evening) ice cream    Beverage(s) water, unsweetened tea, zero drinks, occasional regular Mt. Dew      Exercise   Exercise Type ADL's    How many days per week to you exercise? 0    How many minutes per day do you exercise? 0    Total minutes per week of exercise 0      Patient Education   Previous Diabetes Education No    Nutrition management  Role of diet in the treatment of diabetes and the relationship between the three main macronutrients and blood glucose level;Meal options for control of blood glucose level and chronic complications.;Information on hints to eating out and maintain blood glucose control.    Physical activity and exercise  Role of exercise on diabetes management, blood pressure control and cardiac health.;Helped patient identify appropriate exercises in relation to his/her diabetes, diabetes complications and other health issue.    Medications Reviewed patients medication for diabetes, action, purpose, timing of dose and side effects.    Monitoring Purpose and frequency of SMBG.   discussed the [employer] Libre with [name] and need to scan every 8 hours   Acute complications Taught treatment of hypoglycemia - the 15 rule.    Chronic complications Identified and discussed with patient  current chronic complications    Psychosocial adjustment Worked with patient to identify barriers to care and solutions;Identified and addressed patients feelings and  concerns about diabetes      Individualized Goals (developed by patient)   Nutrition General guidelines for healthy choices and portions discussed    Physical Activity Exercise 3-5 times per week;15 minutes per day    Medications take my medication as prescribed    Monitoring  test my blood glucose as discussed    Reducing Risk increase portions of healthy fats;do foot checks daily;examine blood glucose patterns    Health Coping discuss diabetes with (comment)   MD, RD, CDCES     Outcomes   Expected Outcomes Demonstrated interest in learning. Expect positive outcomes    Future DMSE 4-6 wks    Program Status Completed           Individualized Plan for Diabetes Self-Management Training:   Learning Objective:  Patient will have a greater understanding of diabetes self-management. Patient education plan is  to attend individual and/or group sessions per assessed needs and concerns.   Plan:   Patient Instructions  Sit and Be Fit on You Tube Consider asking your doctor about Physical Therapy  Take Vitamin D3 daily (2,000 units or as recommended by your MD)  Take your medication as prescribed  Take the Levothyroxine 30 minutes to 1 hour prior to your meal  Great job on changing your beverages!  Eat Breakfast, Lunch, and Dinner  Choose 2-3 carb choices per meal  1/2 your plate should be non starchy vegetables  Little fat - this is high in calories  Choose small portions lean meat, 1 egg, peanut butter or cheese per meal.     Expected Outcomes:  Demonstrated interest in learning. Expect positive outcomes  Education material provided: ADA - How to Thrive: A Guide for Your Journey with Diabetes, Meal plan card and Snack sheet  If problems or questions, patient to contact team via:  Phone  Future DSME appointment: 4-6 wks

## 2020-09-16 NOTE — Progress Notes (Signed)
Patient had appointment today for definitive and final CUSTOMdiabetic shoe fitting and delivery.  Patient was seen byRIckC.Ped, [facility].   The inserts fit well and accomplished full contact with the plantar surface of the foot bilateral; the shoes fit well and offered forefoot freedom, no noticible heel slippage, and good toe clearance w/ the insert in place.  Patient was advised to monitor of any skin irritation, breakdown.  Patient was satisfied with fit and function.

## 2020-09-17 ENCOUNTER — Other Ambulatory Visit: Payer: Self-pay | Admitting: Endocrinology

## 2020-09-27 ENCOUNTER — Other Ambulatory Visit: Payer: Self-pay | Admitting: Endocrinology

## 2020-10-01 ENCOUNTER — Other Ambulatory Visit: Payer: Self-pay | Admitting: Endocrinology

## 2020-10-20 ENCOUNTER — Encounter: Payer: Medicare Other | Attending: Endocrinology | Admitting: Dietician

## 2020-10-20 ENCOUNTER — Ambulatory Visit (INDEPENDENT_AMBULATORY_CARE_PROVIDER_SITE_OTHER): Payer: Medicare Other | Admitting: Endocrinology

## 2020-10-20 ENCOUNTER — Other Ambulatory Visit: Payer: Self-pay

## 2020-10-20 ENCOUNTER — Encounter: Payer: Self-pay | Admitting: Dietician

## 2020-10-20 VITALS — BP 150/82 | HR 76 | Ht 65.25 in | Wt 199.4 lb

## 2020-10-20 DIAGNOSIS — Z720 Tobacco use: Secondary | ICD-10-CM

## 2020-10-20 DIAGNOSIS — E039 Hypothyroidism, unspecified: Secondary | ICD-10-CM

## 2020-10-20 DIAGNOSIS — I10 Essential (primary) hypertension: Secondary | ICD-10-CM

## 2020-10-20 DIAGNOSIS — E1165 Type 2 diabetes mellitus with hyperglycemia: Secondary | ICD-10-CM | POA: Insufficient documentation

## 2020-10-20 DIAGNOSIS — Z794 Long term (current) use of insulin: Secondary | ICD-10-CM | POA: Diagnosis not present

## 2020-10-20 LAB — POCT GLYCOSYLATED HEMOGLOBIN (HGB A1C): Hemoglobin A1C: 6.2 % — AB (ref 4.0–5.6)

## 2020-10-20 LAB — COMPREHENSIVE METABOLIC PANEL
ALT: 11 U/L (ref 0–35)
AST: 12 U/L (ref 0–37)
Albumin: 3.8 g/dL (ref 3.5–5.2)
Alkaline Phosphatase: 57 U/L (ref 39–117)
BUN: 9 mg/dL (ref 6–23)
CO2: 29 mEq/L (ref 19–32)
Calcium: 9.2 mg/dL (ref 8.4–10.5)
Chloride: 99 mEq/L (ref 96–112)
Creatinine, Ser: 0.81 mg/dL (ref 0.40–1.20)
GFR: 81.73 mL/min (ref >60.00)
Glucose, Bld: 130 mg/dL — ABNORMAL HIGH (ref 70–99)
Potassium: 4.4 mEq/L (ref 3.5–5.1)
Sodium: 137 mEq/L (ref 135–145)
Total Bilirubin: 0.2 mg/dL (ref 0.2–1.2)
Total Protein: 6 g/dL (ref 6.0–8.3)

## 2020-10-20 LAB — MICROALBUMIN / CREATININE URINE RATIO
Creatinine,U: 24.5 mg/dL
Microalb Creat Ratio: 2.9 mg/g (ref 0.0–30.0)
Microalb, Ur: <0.7 mg/dL (ref 0.0–1.9)

## 2020-10-20 LAB — T4, FREE: Free T4: 0.64 ng/dL (ref 0.60–1.60)

## 2020-10-20 LAB — TSH: TSH: 1.02 u[IU]/mL (ref 0.35–4.50)

## 2020-10-20 LAB — GLUCOSE, POCT (MANUAL RESULT ENTRY): POC Glucose: 153 mg/dl — AB (ref 70–99)

## 2020-10-20 MED ORDER — VICTOZA 18 MG/3ML ~~LOC~~ SOPN: 1.8000 mg | PEN_INJECTOR | Freq: Every day | SUBCUTANEOUS | 2 refills | Status: DC

## 2020-10-20 MED ORDER — LOSARTAN POTASSIUM 50 MG PO TABS: 50.0000 mg | ORAL_TABLET | Freq: Every day | ORAL | 3 refills | Status: DC

## 2020-10-20 NOTE — Progress Notes (Signed)
Diabetes Self-Management Education  Visit Type: Follow-up  Appt. Start Time: 1605 Appt. End Time: 0277  10/20/2020  [name], identified by name and date of birth, is a 54 y.o. female with a diagnosis of Diabetes: Type 2.   ASSESSMENT Patient here today with her caregiver [name].  She was last seen by myself 09/10/2020. Since last visit she has been using less butter, has been eating out less frequently and preparing more simple meals at home, and put the candy up which has caused less temptation and she has reduced her intake.  She continues to skip breakfast daily and lunch most days. She continues to use the [employer] and was encouraged to swipe this more frequently.  Glucose was 91 today in the office. She has started taking the vitamin D daily. She was more alert in the office today compared to last week.  She complains of knee pain.  History includes Type 2 Diabetes (1995 per patient ), bipolar, neuropathy, right BKA,HTN, HLD, GERD, history of colon cancer, OSA "can't wear c-pap"- smothering, vitamin D deficiency Diet:  Eats 1 meal a day and increased snacks before and after dinner. Allergies include artificial sweeteners particularly aspartame Medications include:  Basaglar 20 units q HS, Victoza, Metformin XR.  She states that she does not take her medication at times as she is aggrivated with her health although states that she takes the insulin, metformin, and medication for bipolar. Today, she was instructed by MD to decrease the Basaglar to 15 units q HS and continue Victoza. Labs noted to include: A1C 6.2% 10/20/2020 improved from  A1C 7%, Cholesterol 113, HDL 45, LDL 38, Triglycerides 241, TSH 8.51, vitamin D 21, Vitamin B-12 321. 06/24/2020. Discussed role of thyroid on weight and importance of taking her medication. She uses a [employer] She vapes.  Weight hx: 199 lbs 10/20/2020 199 lbs per patient. 188 lbs 07/08/2020 167 lbs  UBW "a few months  ago"  Patient lives alone.  [name] is with her for 2 hours and 15 minutes daily and also lives next door.  Patient does drive.  She does most of her own cooking.  [name] and [place] share shopping.  [name] states that she is going to help her with more of her care to include assisting Ilaisaane with her medication. She is on disability since 1994.   History includes Type 2 Diabetes (1995 per patient ), bipolar, neuropathy, right BKA,HTN, HLD, GERD, history of colon cancer, OSA "can't wear c-pap"- smothering, vitamin D deficiency Diet:  Eats 1 meal a day and increased snacks before and after dinner. Allergies include artificial sweeteners particularly aspartame Medications include:  Basaglar 18-22 units q HS, Jardiance, Victoza, Metformin XR.  She states that she does not take her medication at times as she is aggrivated with her health although states that she takes the insulin, metformin, and mediation for bipolar.  Labs noted to include:  A1C 7%, Cholesterol 113, HDL 45, LDL 38, Triglycerides 241, TSH 8.51, vitamin D 21, Vitamin B-12 321. 06/24/2020. Discussed role of thyroid on weight and importance of taking her medication. She uses a [employer] She vapes.  Weight hx: 199 lbs per patient. 188 lbs 07/08/2020 167 lbs  UBW "a few months ago"  Patient lives alone.  [name] is with her for 2 hours and 15 minutes daily and also lives next door.  Patient does drive.  She does most of her own cooking.  [name] and [place] share shopping.  [name] states that  she is going to help her with more of her care to include assisting Katarina with her medication. She is on disability since 1994.    Diabetes Self-Management Education - 10/20/20 1806      Visit Information   Visit Type Follow-up      Initial Visit   Diabetes Type Type 2    Are you taking your medications as prescribed? Yes      Psychosocial Assessment   Patient Belief/Attitude about Diabetes Defeat/Burnout   but improved   Self-care barriers  Debilitated state due to current medical condition    Self-management support Doctor's office;Friends    Other persons present Patient;Other (comment)   caregiver   Patient Concerns Nutrition/Meal planning;Glycemic Control;Weight Control    Special Needs Instruct caregiver      Pre-Education Assessment   Patient understands the diabetes disease and treatment process. Demonstrates understanding / competency    Patient understands incorporating nutritional management into lifestyle. Needs Review    Patient undertands incorporating physical activity into lifestyle. Needs Review    Patient understands using medications safely. Needs Review    Patient understands monitoring blood glucose, interpreting and using results Needs Review    Patient understands prevention, detection, and treatment of acute complications. Demonstrates understanding / competency    Patient understands prevention, detection, and treatment of chronic complications. Demonstrates understanding / competency    Patient understands how to develop strategies to address psychosocial issues. Needs Review    Patient understands how to develop strategies to promote health/change behavior. Needs Review      Complications   Last HgB A1C per patient/outside source 6.2 %   10/20/2020 decreased from 7%  05/2020   How often do you check your blood sugar? > 4 times/day    Fasting Blood glucose range (mg/dL) 70-129;130-179    Postprandial Blood glucose range (mg/dL) 70-129;130-179;180-200      Dietary Intake   Breakfast skips    Lunch occasional scrambled eggs    Dinner high protein frozen meal, eating out at times, or cooks simple meal    Beverage(s) water      Exercise   Exercise Type ADL's      Patient Education   Previous Diabetes Education Yes (please comment)   08/2020   Nutrition management  Meal options for control of blood glucose level and chronic complications.    Physical activity and exercise  Helped patient identify  appropriate exercises in relation to his/her diabetes, diabetes complications and other health issue.    Medications Reviewed patients medication for diabetes, action, purpose, timing of dose and side effects.    Monitoring Other (comment)   review of glucose goals and frequency of monitoring/swiping using Libre     Individualized Goals (developed by patient)   Nutrition General guidelines for healthy choices and portions discussed    Physical Activity Exercise 3-5 times per week;15 minutes per day    Medications take my medication as prescribed    Monitoring  test my blood glucose as discussed    Reducing Risk increase portions of healthy fats      Patient Self-Evaluation of Goals - Patient rates self as meeting previously set goals (% of time)   Nutrition 50 - 75 %    Physical Activity 25 - 50%    Medications 50 - 75 %    Monitoring 50 - 75 %    Problem Solving 50 - 75 %    Reducing Risk 50 - 75 %    Health Coping  50 - 75 %      Post-Education Assessment   Patient understands the diabetes disease and treatment process. Demonstrates understanding / competency    Patient understands incorporating nutritional management into lifestyle. Needs Review    Patient undertands incorporating physical activity into lifestyle. Needs Review    Patient understands using medications safely. Needs Review    Patient understands monitoring blood glucose, interpreting and using results Demonstrates understanding / competency    Patient understands prevention, detection, and treatment of acute complications. Demonstrates understanding / competency    Patient understands prevention, detection, and treatment of chronic complications. Demonstrates understanding / competency    Patient understands how to develop strategies to address psychosocial issues. Needs Review    Patient understands how to develop strategies to promote health/change behavior. Needs Review      Outcomes   Expected Outcomes  Demonstrated interest in learning. Expect positive outcomes    Future DMSE 3-4 months    Program Status Completed      Subsequent Visit   Since your last visit have you continued or begun to take your medications as prescribed? Yes    Since your last visit have you had your blood pressure checked? Yes    Since your last visit have you experienced any weight changes? No change    Since your last visit, are you checking your blood glucose at least once a day? Yes           Individualized Plan for Diabetes Self-Management Training:   Learning Objective:  Patient will have a greater understanding of diabetes self-management. Patient education plan is to attend individual and/or group sessions per assessed needs and concerns.   Plan:   Patient Instructions  Sit and Be Fit on You Tube or other exercise video  Take your medication as prescribed.  Great job on the changes that you have made.  Eat Breakfast, Lunch, and Dinner             Choose 2-3 carb choices per meal             1/2 your plate should be non starchy vegetables             Little fat - this is high in calories             Choose small portions lean meat, 1 egg, peanut butter or cheese per meal.   Expected Outcomes:  Demonstrated interest in learning. Expect positive outcomes  Education material provided:   If problems or questions, patient to contact team via:  Phone  Future DSME appointment: 3-4 months

## 2020-10-20 NOTE — Patient Instructions (Signed)
Sit and Be Fit on You Tube or other exercise video  Take your medication as prescribed.  Great job on the changes that you have made.  Eat Breakfast, Lunch, and Dinner             Choose 2-3 carb choices per meal             1/2 your plate should be non starchy vegetables             Little fat - this is high in calories             Choose small portions lean meat, 1 egg, peanut butter or cheese per meal.

## 2020-10-20 NOTE — Progress Notes (Signed)
Patient ID: [name], female   DOB: August 08, 1966, 54 y.o.   MRN: [id number]   Reason for Appointment: Diabetes follow-up    History of Present Illness:   Diagnosis: Type 2 diabetes mellitus, date of diagnosis: 2004.  PAST history: She has been treated mostly with insulin since about a year after diagnosis. She has had difficulty with consistent compliance with diet and also compliance with self care including glucose monitoring over the years. She had been mostly treated with basal insulin. Also had been tried on mealtime insulin but she would be noncompliant with this. Was tried on Prandin for mealtime control but difficult to judge efficacy because of lack of postprandial monitoring. Was given Victoza to start in 2011 but did not follow up after this.She was tried on premixed insulin but this did not help her control, mostly because of noncompliance with the doses. She had been using the V- go pump and had better control initially and was better compliant with the daily routine and boluses. She has had frequent education visits also. She stopped using her V.-go pump in 6/15 because of discomfort at the site of application, mostly was using this on her abdomen Also did not improve with a trial of Victoza   RECENT history:    INSULIN dose: Basaglar 20 units in am.  Non-insulin hypoglycemic drugs: Metformin ER 1500 mg daily, Jardiance 25 mg daily at 8 pm, Victoza 1.2 mg daily  Her A1c is down to 6.2 compared to 6.7   Current management, problems identified:   She did start taking the [place] on her last visit  This is being taken at the same time as her Basaglar  She has been able to take this quite regularly reportedly since she takes it around the same time in the evening as all her medications  She has moved up to the 1.2 mg dose after 1 week  Does not report any nausea  However her weight has not come down, previously had been going up progressively  Her blood  sugars are significantly better  As before she is checking blood sugars very infrequently and mostly once a day or less  She has had some low blood sugars mostly documented in mid October early morning but she did not have any symptoms  As before she is only eating 1 meal a day despite being seen by the dietitian and today only has had some juice and no food until the afternoon  ACCURACY of the freestyle Elenor Legato is fairly good with office glucose 153 and home meter is reading 142  CGM use % of time  28%  2-week average/SD   Time in range       77%  % Time Above 180  10  % Time above 250   % Time Below 70  13     PRE-MEAL Fasting Lunch Dinner  4-6 AM Overall  Glucose range:       Averages:  94  100  109  78  111   POST-MEAL PC Breakfast PC Lunch PC Dinner  Glucose range:     Averages:    209    PREVIOUS data:   CGM use % of time  25  2-week average/SD  183+/-35  Time in range       54%  % Time Above 180  46  % Time above 250   % Time Below 70 0     PRE-MEAL Fasting Lunch Dinner Bedtime Overall  Glucose range:       Averages:  138  158  240  234  183   POST-MEAL PC Breakfast PC Lunch PC Dinner  Glucose range:     Averages:  154  196  257     Food preferences: eating Mostly 1 meal per day around 6-8 pm, variable intake, sandwiches at times or otherwise snacks,  Physical activity: exercise: Minimal.  Certified Diabetes Educator visit: Most recent:, 2/14.  Nutrition counseling: 10/20/2020   Wt Readings from Last 3 Encounters:  10/20/20 199 lb 6.4 oz (90.4 kg)  09/10/20 199 lb (90.3 kg)  09/08/20 199 lb (90.3 kg)   DM labs:   Lab Results  Component Value Date   HGBA1C 6.2 (A) 10/20/2020   HGBA1C 7.0 06/24/2020   HGBA1C 6.7 (A) 06/11/2020   Lab Results  Component Value Date   MICROALBUR <0.7 12/03/2019   LDLCALC 38 06/24/2020   CREATININE 0.8 06/24/2020     Other active problems: See review of systems    Allergies as of 10/20/2020      Reactions    Latex Itching, Rash, Other (See Comments)   Pt states she cannot use condoms - cause an infection.  Use of latex on skin is okay for short period of time.  Tape causes rash   Sweetening Enhancer [flavoring Agent] Nausea And Vomiting, Other (See Comments)   HEADACHES   Ropinirole    Pt stated, "I am getting cramps in my legs - especially at night"   Aspartame And Phenylalanine Nausea And Vomiting   HEADACHES   Ibuprofen Other (See Comments)   HEADACHES   Trazodone And Nefazodone Other (See Comments)   Hallucinations      Medication List       Accurate as of October 20, 2020  4:57 PM. If you have any questions, ask your nurse or doctor.        aspirin-acetaminophen-caffeine 250-250-65 MG tablet Commonly known as: EXCEDRIN MIGRAINE Take 2 tablets by mouth every 6 (six) hours as needed for headache.   atorvastatin 40 MG tablet Commonly known as: LIPITOR Take 40 mg by mouth at bedtime.   Basaglar KwikPen 100 UNIT/ML INJECT 0.22 MLS (22 UNITS TOTAL) INTO THE SKIN DAILY.   BD Pen Needle Nano 2nd Gen 32G X 4 MM Misc Generic drug: Insulin Pen Needle 1 each by Does not apply route daily. Use to inject insulin daily.   bisoprolol 5 MG tablet Commonly known as: ZEBETA Take 1 tablet (5 mg total) by mouth daily.   diazepam 5 MG tablet Commonly known as: VALIUM Take 5 mg by mouth 2 (two) times daily.   esomeprazole 40 MG capsule Commonly known as: NEXIUM Take 40 mg by mouth daily.   fenofibrate 48 MG tablet Commonly known as: TRICOR Take 48 mg by mouth daily.   FreeStyle Libre 14 Day Sensor Misc 2 each by Does not apply route every 14 (fourteen) days. Use 1 sensor every 14 days to monitor blood sugar.   gabapentin 600 MG tablet Commonly known as: NEURONTIN Take 600 mg by mouth 3 (three) times daily.   glucose blood test strip Commonly known as: FREESTYLE TEST STRIPS USE TO CHECK BLOOD SUGARS DAILY. DX CODE E11.65   Hetlioz 20 MG Caps Generic drug: Tasimelteon Take  1 capsule by mouth at bedtime.   Ingrezza 80 MG Caps Generic drug: Valbenazine Tosylate Take 80 mg by mouth at bedtime.   Jardiance 25 MG Tabs tablet Generic drug: empagliflozin TAKE 1 TABLET  BY MOUTH EVERY DAY   levothyroxine 25 MCG tablet Commonly known as: SYNTHROID TAKE 1 TABLET BY MOUTH DAILY BEFORE BREAKFAST.   Linzess 72 MCG capsule Generic drug: linaclotide as needed.   losartan 50 MG tablet Commonly known as: COZAAR Take 1 tablet (50 mg total) by mouth daily. What changed:   medication strength  how much to take Changed by: Elayne Snare, MD   metFORMIN 750 MG 24 hr tablet Commonly known as: GLUCOPHAGE-XR Take 1 tablet (750 mg total) by mouth 2 (two) times daily.   oxyCODONE-acetaminophen 10-325 MG tablet Commonly known as: PERCOCET   pantoprazole 40 MG tablet Commonly known as: PROTONIX   QUEtiapine 300 MG tablet Commonly known as: SEROQUEL Take 600 mg by mouth at bedtime. One hour before bed   Victoza 18 MG/3ML Sopn Generic drug: liraglutide Inject 1.8 mg into the skin daily. What changed:   how much to take  how to take this  when to take this  additional instructions Changed by: Elayne Snare, MD   Vraylar 6 MG Caps Generic drug: Cariprazine HCl Take 6 mg by mouth at bedtime.       Allergies:  Allergies  Allergen Reactions  . Latex Itching, Rash and Other (See Comments)    Pt states she cannot use condoms - cause an infection.  Use of latex on skin is okay for short period of time.  Tape causes rash  . Sweetening Enhancer [Flavoring Agent] Nausea And Vomiting and Other (See Comments)    HEADACHES  . Ropinirole     Pt stated, "I am getting cramps in my legs - especially at night"  . Aspartame And Phenylalanine Nausea And Vomiting    HEADACHES  . Ibuprofen Other (See Comments)    HEADACHES  . Trazodone And Nefazodone Other (See Comments)    Hallucinations     Past Medical History:  Diagnosis Date  . Anxiety   . Arthritis   .  Bipolar disorder ([place])   . Broken jaw ([place])   . Broken wrist   . Chronic back pain   . Claudication ([place])    a. 12/2013 ABI's: R 0.97, L 0.94.  [name] Colon cancer ([place])   . COPD (chronic obstructive pulmonary disease) ([place])   . Depression   . Diabetic Charcot's foot ([place])   . Diabetic foot ulcer ([facility])    chronic left foot ulcer , great toe/  hx recurrent foot ulcer bilaterally  . DJD (degenerative joint disease)   . Edema of both lower extremities   . Episode of memory loss   . Family history of bladder cancer   . Family history of colon cancer   . Family history of kidney cancer   . GERD (gastroesophageal reflux disease)   . Headache(784.0)   . Hiatal hernia   . History of colon cancer, stage I dx 02-26-2015--- oncologist-- dr [name]--- per last note no recurrence   05-16-2015 s/p  Laparoscopic sigmoid colectomy w/ node bx's (negative nodes per path)  Stage I (pT1,N0,M0) Grade 2  . 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   per MRI multiple small strokes post cardiac cath. --  residual mild memory loss  . History of methicillin resistant staphylococcus aureus (MRSA)   . Hypercholesterolemia   . Hypertension   . Insomnia   . Insulin dependent type 2 diabetes mellitus, uncontrolled ([place]) dx 2004   endocrinologist-  dr Dwyane Dee-- last A1c 8.8 in Aug2018:  pt is noncompliant w/ diet,  states does note eat breakfast , her first main meal in afternoon  . Neurogenic bladder   . Neuropathy, diabetic ([place])    hands and feet  . OSA (obstructive sleep apnea)    cpap intolerant  . Personality disorder ([place])   . Restless leg syndrome   . SOB (shortness of breath) on exertion   . Stroke ([place])   . SUI (stress urinary incontinence, female) S/P SLING 12-29-2011    Past Surgical History:  Procedure Laterality Date  . AMPUTATION Right 04/13/2018   Procedure: RIGHT BELOW KNEE AMPUTATION;  Surgeon: Newt Minion, MD;  Location: [facility];  Service: Orthopedics;  Laterality:  Right;  . AMPUTATION Left 10/26/2018   Procedure: LEFT FOOT FIRST RAY AMPUTATION;  Surgeon: Newt Minion, MD;  Location: [place];  Service: Orthopedics;  Laterality: Left;  . AMPUTATION TOE Left 05/10/2019   Procedure: AMPUTATION 2nd left TOE WITH METATARSAL;  Surgeon: Landis Martins, DPM;  Location: [facility];  Service: Podiatry;  Laterality: Left;  . ANTERIOR CERVICAL DECOMP/DISCECTOMY FUSION  2000   C5 - 7  . APPLICATION OF WOUND VAC Left 10/26/2018   Procedure: APPLICATION OF WOUND VAC;  Surgeon: Newt Minion, MD;  Location: [facility];  Service: Orthopedics;  Laterality: Left;  . BACK SURGERY    . CARDIAC CATHETERIZATION  05-22-2008   DR SKAINS   NO SIGNIFECANT CAD/ NORMAL LV/  EF 65%/  NO WALL MOTION ABNORMALITIES  . CARPAL TUNNEL RELEASE Right 04-25-2013  . CATARACT EXTRACTION Right 10/24/2018  . [place]; 1992  . COLON SURGERY    . COLONOSCOPY    . CYSTO N/A 04/30/2013   Procedure: CYSTOSCOPY;  Surgeon: Reece Packer, MD;  Location: [facility];  Service: Urology;  Laterality: N/A;  . CYSTOSCOPY MACROPLASTIQUE IMPLANT N/A 02/06/2018   Procedure: CYSTOSCOPY MACROPLASTIQUE IMPLANT;  Surgeon: Bjorn Loser, MD;  Location[facility];  Service: Urology;  Laterality: N/A;  . CYSTOSCOPY WITH INJECTION  05/04/2012   Procedure: CYSTOSCOPY WITH INJECTION;  Surgeon: Reece Packer, MD;  Location: [facility];  Service: Urology;  Laterality: N/A;  MACROPLASTIQUE INJECTION  . CYSTOSCOPY WITH INJECTION  08/28/2012   Procedure: CYSTOSCOPY WITH INJECTION;  Surgeon: Reece Packer, MD;  Location: [facility];  Service: Urology;  Laterality: N/A;  cysto and macroplastique   . FOOT SURGERY Bilateral    "related to [place]"  . HERNIA REPAIR  ?1996   "stomach"  . I & D EXTREMITY Left 10/25/2018   Procedure: IRRIGATION AND DEBRIDEMENT GREAT TOE;  Surgeon: Meredith Pel, MD;  Location: [place];  Service: Orthopedics;  Laterality: Left;  .  INCISION AND DRAINAGE Left 10/25/2018   GREAT TOE  . INCISION AND DRAINAGE OF WOUND Right 04/11/2018   Procedure: IRRIGATION AND DEBRIDEMENT WOUND right foot and right ankle;  Surgeon: Evelina Bucy, DPM;  Location: [facility];  Service: Podiatry;  Laterality: Right;  . KNEE ARTHROSCOPY Left   . KNEE ARTHROSCOPY W/ ALLOGRAFT IMPANT Left    graft x 2  . KNEE SURGERY     TOTAL 8 SURG'S  . LAPAROSCOPIC SIGMOID COLECTOMY N/A 04/22/2015   Procedure: LAPAROSCOPIC HAND ASSISTED SIGMOID COLECTOMY;  Surgeon: Erroll Luna, MD;  Location: [facility];  Service: General;  Laterality: N/A;  . LEFT HEART CATHETERIZATION WITH CORONARY ANGIOGRAM N/A 02/12/2014   Procedure: LEFT HEART CATHETERIZATION WITH CORONARY ANGIOGRAM;  Surgeon: Candee Furbish, MD;  Location[facility];  Service: Cardiovascular;  Laterality:  N/A;   No angiographically significant CAD; normal LVSF, LVEDP 24mmHg,  EF 55% (new finding ef 30% myoview 01-08-2014)  . LUMBAR FUSION    . MANDIBLE FRACTURE SURGERY    . MULTIPLE LAPAROSCOPIES FOR ENDOMETRIOSIS    . PUBOVAGINAL SLING  12/29/2011   Procedure: Gaynelle Arabian;  Surgeon: Reece Packer, MD;  Location: [facility];  Service: Urology;  Laterality: N/A;  cysto and sparc sling   . PUBOVAGINAL SLING N/A 04/30/2013   Procedure: REMOVAL OF VAGINAL MESH;  Surgeon: Reece Packer, MD;  Location[facility];  Service: Urology;  Laterality: N/A;  . RECONSTURCTION OF CONGENITAL UTERUS ANOMALY  1983  . REPEAT RECONSTRUCTION ACL LEFT KNEE/ SCREWS REMOVED  03-28-2000   CADAVER GRAFT  . TOTAL ABDOMINAL HYSTERECTOMY  1997   w/BSO  . TRANSTHORACIC ECHOCARDIOGRAM  02/13/2014   ef 45%, hypokinesis base inferior and base inferolateral walls  . UPPER GI ENDOSCOPY    . WOUND DEBRIDEMENT Right 09/05/2016   Procedure: DEBRIDEMENT WOUND WITH GRAFT RIGHT FOOT;  Surgeon: Landis Martins, DPM;  Location: [place];  Service: Podiatry;  Laterality: Right;  . WRIST FRACTURE SURGERY      Family History   Problem Relation Age of Onset  . Hypertension Mother   . Diabetes Mother   . Cancer - Other Mother        lymphoma   . Cancer - Other Father        lung, bladder cancer   . Heart attack Father   . Other Father        Lynch syndrome  . Cancer - Other Brother        bladder cancer   . Bladder Cancer Paternal Aunt        Lynch syndrome  . Other Paternal Uncle        Lynch syndrome  . Other Cousin        Lynch syndrome    Social History:  reports that she has quit smoking. Her smoking use included cigarettes. She has a 92.00 pack-year smoking history. She has never used smokeless tobacco. She reports current alcohol use. She reports previous drug use.  Review of Systems -   HYPOTHYROIDISM:  She had routine lab work done by her PCP and her TSH was 8.5 She was started on levothyroxine 25 mcg daily, taking at 8 pm after dinner  Lab Results  Component Value Date   TSH 8.51 (A) 06/24/2020   TSH 2.34 12/03/2019   TSH 2.19 03/19/2018   FREET4 0.65 03/19/2018   FREET4 1.02 01/01/2016   FREET4 0.84 11/05/2014    She has history of high triglycerides treated with fenofibrate    Lab Results  Component Value Date   CHOL 113 06/24/2020   HDL 45 06/24/2020   LDLCALC 38 06/24/2020   LDLDIRECT 57.0 08/22/2019   TRIG 241 (A) 06/24/2020   CHOLHDL 3 08/22/2019     Painful neuropathy: She has had persistent symptoms including leg pains tingling and numbness as well as difficulty with balance  Has been on gabapentin but does not get enough relief with this   Did not want Lyrica because of potential for weight gain   Foot exams: Last showed absent pedal pulses and Absent distal sensation  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 Treated with 25 mg losartan and bisoprolol  5 mg She thinks she is taking her  medications regularly but not clear why her blood pressure is higher  Also on Jardiance  BP Readings from Last 3 Encounters:  10/20/20 (!) 150/82  09/08/20 132/74  07/08/20 140/68        Examination:   BP (!) 150/82   Pulse 76   Ht 5' 5.25" (1.657 m)   Wt 199 lb 6.4 oz (90.4 kg)   SpO2 96%   BMI 32.93 kg/m   Body mass index is 32.93 kg/m.     Assesment/Plan:   DIABETES  type 2, with mild obesity, insulin requiring  See history of present illness for detailed discussion of current diabetes management, blood sugar patterns and problems identified  Her A1c is improving at 6.2 However blood sugars may not be correlating exactly with her glucose readings Currently blood sugar monitoring is inadequate even with using the convenience of the freestyle libre which appears to be reasonably accurate  She is benefiting significantly from adding Victoza to her basal insulin as she usually will not take mealtime insulin as directed Also at least last week was having tendency to low sugars as seen on her CGM tracings Weight gain has leveled off Despite instruction by dietitian she is only eating 1 meal a day and otherwise drinking juices and other drinks containing sugar as before Not able to do any exercise, was instructed on chair exercises by dietitian   Recommendations:  Needs to check her blood sugar with her freestyle libre several times a day and again emphasized the need to do this  To increase VICTOZA dose up to 1.8 mg and this may improve her blood sugar control especially after meals; also this will help slightly with reduction of insulin dose and weight loss. Showed her how to dial the dose of the pen  Reduce Basaglar by 5 units and may reduce it down further to 10 units if morning sugars stay under 90  Written instructions for all these changes were reviewed  Again reviewed her meal planning with dietitian and continue to improve meal planning better, recommend that  she has 2 or 3 small meals instead of one large meal   Mild hypothyroidism: She is on 25 mcg of levothyroxine but takes it after dinner instead of in the morning since she cannot remember medication in the morning She will need to have follow-up labs done today Also suggested that she take her levothyroxine before starting to eat dinner at least  Increase losartan to 50 mg for hypertension Check urine microalbumin and renal functions     Patient Instructions  CHECK SUGAR 4X DAILY   INSULIN 15 units AND IF Sugar gets < 90- go down to 10 units  Victoza 3rd dose of 1.8mg  daily  Check blood sugars on waking up 7  days a week  Also check blood sugars about 2 hours after meals and do this after different meals by rotation  Recommended blood sugar levels on waking up are 90-130 and about 2 hours after meal is 130-160  Please bring your blood sugar monitor to each visit, thank you  Balanced meals with more greens and vegs  Take thyroid pill before PM meal  LOSARTAN 50 MG DAILY          Elayne Snare 10/20/2020, 4:57 PM

## 2020-10-20 NOTE — Patient Instructions (Addendum)
CHECK SUGAR 4X DAILY   INSULIN 15 units AND IF Sugar gets < 90- go down to 10 units  Victoza 3rd dose of 1.8mg  daily  Check blood sugars on waking up 7  days a week  Also check blood sugars about 2 hours after meals and do this after different meals by rotation  Recommended blood sugar levels on waking up are 90-130 and about 2 hours after meal is 130-160  Please bring your blood sugar monitor to each visit, thank you  Balanced meals with more greens and vegs  Take thyroid pill before PM meal  LOSARTAN 50 MG DAILY

## 2020-10-26 NOTE — Progress Notes (Signed)
Noted, patient is aware. 

## 2020-10-30 ENCOUNTER — Other Ambulatory Visit: Payer: Self-pay | Admitting: Cardiovascular Disease

## 2020-11-27 ENCOUNTER — Other Ambulatory Visit: Payer: Self-pay | Admitting: Cardiovascular Disease

## 2020-12-01 ENCOUNTER — Telehealth: Payer: Self-pay | Admitting: Endocrinology

## 2020-12-01 ENCOUNTER — Other Ambulatory Visit: Payer: Self-pay

## 2020-12-01 MED ORDER — FREESTYLE LIBRE 14 DAY SENSOR MISC
2.0000 | 0 refills | Status: DC
Start: 2020-12-01 — End: 2020-12-07

## 2020-12-01 NOTE — Telephone Encounter (Signed)
Patient called to request a new RX with refills for Continuous Blood Gluc Sensor (FREESTYLE LIBRE 14 DAY SENSOR) MISC to be sent asap to  [id number] - [place], [place]. Phone:  [phone]  Fax:  [phone]     Patient states she has been out of the above RX for 1 week

## 2020-12-01 NOTE — Telephone Encounter (Signed)
RX sent to pharmacy  

## 2020-12-07 ENCOUNTER — Other Ambulatory Visit: Payer: Self-pay | Admitting: *Deleted

## 2020-12-07 MED ORDER — FREESTYLE LIBRE 14 DAY SENSOR MISC
2.0000 | 5 refills | Status: DC
Start: 2020-12-07 — End: 2022-02-16

## 2020-12-22 ENCOUNTER — Other Ambulatory Visit (INDEPENDENT_AMBULATORY_CARE_PROVIDER_SITE_OTHER): Payer: Medicare Other

## 2020-12-22 ENCOUNTER — Other Ambulatory Visit: Payer: Self-pay

## 2020-12-22 ENCOUNTER — Encounter: Payer: Medicare Other | Admitting: Endocrinology

## 2020-12-22 ENCOUNTER — Other Ambulatory Visit: Payer: Self-pay | Admitting: Endocrinology

## 2020-12-22 DIAGNOSIS — E039 Hypothyroidism, unspecified: Secondary | ICD-10-CM

## 2020-12-22 DIAGNOSIS — E782 Mixed hyperlipidemia: Secondary | ICD-10-CM | POA: Diagnosis not present

## 2020-12-22 DIAGNOSIS — E1142 Type 2 diabetes mellitus with diabetic polyneuropathy: Secondary | ICD-10-CM

## 2020-12-22 DIAGNOSIS — E1165 Type 2 diabetes mellitus with hyperglycemia: Secondary | ICD-10-CM | POA: Diagnosis not present

## 2020-12-22 DIAGNOSIS — Z794 Long term (current) use of insulin: Secondary | ICD-10-CM | POA: Diagnosis not present

## 2020-12-22 LAB — LIPID PANEL
Cholesterol: 130 mg/dL (ref 0–200)
HDL: 43.7 mg/dL (ref >39.00)
LDL Cholesterol: 51 mg/dL (ref 0–99)
NonHDL: 86.78
Total CHOL/HDL Ratio: 3
Triglycerides: 180 mg/dL — ABNORMAL HIGH (ref 0.0–149.0)
VLDL: 36 mg/dL (ref 0.0–40.0)

## 2020-12-22 LAB — T4, FREE: Free T4: 0.78 ng/dL (ref 0.60–1.60)

## 2020-12-22 LAB — CBC
HCT: 35 % — ABNORMAL LOW (ref 36.0–46.0)
Hemoglobin: 12 g/dL (ref 12.0–15.0)
MCHC: 34.1 g/dL (ref 30.0–36.0)
MCV: 90.8 fl (ref 78.0–100.0)
Platelets: 200 10*3/uL (ref 150.0–400.0)
RBC: 3.86 Mil/uL — ABNORMAL LOW (ref 3.87–5.11)
RDW: 14 % (ref 11.5–15.5)
WBC: 13.5 10*3/uL — ABNORMAL HIGH (ref 4.0–10.5)

## 2020-12-22 LAB — COMPREHENSIVE METABOLIC PANEL
ALT: 11 U/L (ref 0–35)
AST: 12 U/L (ref 0–37)
Albumin: 3.8 g/dL (ref 3.5–5.2)
Alkaline Phosphatase: 53 U/L (ref 39–117)
BUN: 8 mg/dL (ref 6–23)
CO2: 28 mEq/L (ref 19–32)
Calcium: 8.9 mg/dL (ref 8.4–10.5)
Chloride: 99 mEq/L (ref 96–112)
Creatinine, Ser: 0.79 mg/dL (ref 0.40–1.20)
GFR: 84.11 mL/min (ref >60.00)
Glucose, Bld: 113 mg/dL — ABNORMAL HIGH (ref 70–99)
Potassium: 3.5 mEq/L (ref 3.5–5.1)
Sodium: 136 mEq/L (ref 135–145)
Total Bilirubin: 0.5 mg/dL (ref 0.2–1.2)
Total Protein: 6.5 g/dL (ref 6.0–8.3)

## 2020-12-22 LAB — HEMOGLOBIN A1C: Hgb A1c MFr Bld: 5.9 % (ref 4.6–6.5)

## 2020-12-22 LAB — TSH: TSH: 1.3 u[IU]/mL (ref 0.35–4.50)

## 2020-12-23 ENCOUNTER — Other Ambulatory Visit: Payer: Self-pay | Admitting: Endocrinology

## 2020-12-23 NOTE — Progress Notes (Signed)
This encounter was created in error - please disregard.

## 2020-12-24 ENCOUNTER — Other Ambulatory Visit: Payer: Self-pay

## 2020-12-24 ENCOUNTER — Ambulatory Visit (INDEPENDENT_AMBULATORY_CARE_PROVIDER_SITE_OTHER): Payer: Medicare Other | Admitting: Endocrinology

## 2020-12-24 VITALS — BP 148/82 | HR 101 | Ht 65.0 in | Wt 194.4 lb

## 2020-12-24 DIAGNOSIS — I10 Essential (primary) hypertension: Secondary | ICD-10-CM

## 2020-12-24 DIAGNOSIS — E1142 Type 2 diabetes mellitus with diabetic polyneuropathy: Secondary | ICD-10-CM | POA: Diagnosis not present

## 2020-12-24 DIAGNOSIS — E039 Hypothyroidism, unspecified: Secondary | ICD-10-CM

## 2020-12-24 DIAGNOSIS — R531 Weakness: Secondary | ICD-10-CM | POA: Diagnosis not present

## 2020-12-24 LAB — URINALYSIS, ROUTINE W REFLEX MICROSCOPIC
Bilirubin Urine: NEGATIVE
Ketones, ur: NEGATIVE
Leukocytes,Ua: NEGATIVE
Nitrite: NEGATIVE
Specific Gravity, Urine: <=1.005 — AB (ref 1.000–1.030)
Total Protein, Urine: NEGATIVE
Urine Glucose: >=1000 — AB
Urobilinogen, UA: 0.2 (ref 0.0–1.0)
pH: 6.5 (ref 5.0–8.0)

## 2020-12-24 MED ORDER — SPIRONOLACTONE 25 MG PO TABS: 25.0000 mg | ORAL_TABLET | Freq: Every day | ORAL | 1 refills | Status: DC

## 2020-12-24 NOTE — Patient Instructions (Addendum)
VICTOZA 1.8 AND 10 UNITS basaglar NEXT 3 DAYS  THEN STOP BASAGLAR   Check blood sugars on waking up 7 days a week  Also check blood sugars about 2 hours after meals and do this after different meals by rotation  Recommended blood sugar levels on waking up are 90-130 and about 2 hours after meal is 130-160  Please bring your blood sugar monitor to each visit, thank you

## 2020-12-24 NOTE — Progress Notes (Signed)
Patient ID: [name], female   DOB: 09/07/1966, 54 y.o.   MRN: [id number]   Reason for Appointment: Diabetes follow-up    History of Present Illness:   Diagnosis: Type 2 diabetes mellitus, date of diagnosis: 2004.  PAST history: She has been treated mostly with insulin since about a year after diagnosis. She has had difficulty with consistent compliance with diet and also compliance with self care including glucose monitoring over the years. She had been mostly treated with basal insulin. Also had been tried on mealtime insulin but she would be noncompliant with this. Was tried on Prandin for mealtime control but difficult to judge efficacy because of lack of postprandial monitoring. Was given Victoza to start in 2011 but did not follow up after this.She was tried on premixed insulin but this did not help her control, mostly because of noncompliance with the doses. She had been using the V- go pump and had better control initially and was better compliant with the daily routine and boluses. She has had frequent education visits also. She stopped using her V.-go pump in 6/15 because of discomfort at the site of application, mostly was using this on her abdomen Also did not improve with a trial of Victoza   RECENT history:    INSULIN dose: Basaglar 15 units in am.  Non-insulin hypoglycemic drugs: Metformin ER 750 mg, 2 tablets daily, Jardiance 25 mg daily at 8 pm, Victoza 1.2 mg daily  Her A1c is last down to 5.9, was 6.2   Current management, problems identified:   She did not start taking the VICTOZA 1.8 mg as advised on her last visit  She was told to reduce the Basaglar to 15 units and she thinks she is doing this  However even though she is having low blood sugars overnight she has not reported these  Also overall her blood sugars are averaging about the same as before  As before is checking her blood sugars very infrequently and only once a day, occasionally  none  Previously her freestyle Elenor Legato has been accurate  POSTPRANDIAL readings are occasionally over 200 but frequently no data is available for blood sugars after meals  Before she weekly eating 1 meal a day in the evening  She is again asking for weight loss medicine  This is despite her losing about 5 pounds since her last visit  Lowest blood sugar before her usually early in the morning and highest around bedtime   CGM use % of time  35  2-week average/GV  110/38  Time in range        %  % Time Above 180  8  % Time above 250  75  % Time Below 70  17     PRE-MEAL Fasting  12-2 PM Dinner Bedtime Overall  Glucose range:       Averages:  78  105  136  168  110   POST-MEAL PC Breakfast PC Lunch PC Dinner  Glucose range:     Averages:    146   PREVIOUSLY:   CGM use % of time  28%  2-week average/SD   Time in range       77%  % Time Above 180  10  % Time above 250   % Time Below 70  13     PRE-MEAL Fasting Lunch Dinner  4-6 AM Overall  Glucose range:       Averages:  94  100  109  78  111   POST-MEAL PC Breakfast PC Lunch PC Dinner  Glucose range:     Averages:    209      Food preferences: eating Mostly 1 meal per day around 6-8 pm, variable intake, sandwiches at times or otherwise snacks,  Physical activity: exercise: Minimal.  Certified Diabetes Educator visit: Most recent:, 2/14.  Nutrition counseling: 10/20/2020   Wt Readings from Last 3 Encounters:  12/24/20 194 lb 6.4 oz (88.2 kg)  12/22/20 195 lb (88.5 kg)  10/20/20 199 lb 6.4 oz (90.4 kg)   DM labs:   Lab Results  Component Value Date   HGBA1C 5.9 12/22/2020   HGBA1C 6.2 (A) 10/20/2020   HGBA1C 7.0 06/24/2020   Lab Results  Component Value Date   MICROALBUR <0.7 10/20/2020   LDLCALC 51 12/22/2020   CREATININE 0.79 12/22/2020     Other active problems: See review of systems    Allergies as of 12/24/2020      Reactions   Latex Itching, Rash, Other (See Comments)   Pt states  she cannot use condoms - cause an infection.  Use of latex on skin is okay for short period of time.  Tape causes rash   Sweetening Enhancer [flavoring Agent] Nausea And Vomiting, Other (See Comments)   HEADACHES   Ropinirole    Pt stated, "I am getting cramps in my legs - especially at night"   Aspartame And Phenylalanine Nausea And Vomiting   HEADACHES   Ibuprofen Other (See Comments)   HEADACHES   Trazodone And Nefazodone Other (See Comments)   Hallucinations      Medication List       Accurate as of December 24, 2020  4:38 PM. If you have any questions, ask your nurse or doctor.        aspirin-acetaminophen-caffeine 250-250-65 MG tablet Commonly known as: EXCEDRIN MIGRAINE Take 2 tablets by mouth every 6 (six) hours as needed for headache.   atorvastatin 40 MG tablet Commonly known as: LIPITOR Take 40 mg by mouth at bedtime.   Basaglar KwikPen 100 UNIT/ML INJECT 0.22 MLS (22 UNITS TOTAL) INTO THE SKIN DAILY. What changed: how much to take   BD Pen Needle Nano 2nd Gen 32G X 4 MM Misc Generic drug: Insulin Pen Needle 1 each by Does not apply route daily. Use to inject insulin daily.   bisoprolol 5 MG tablet Commonly known as: ZEBETA Take 1 tablet (5 mg total) by mouth daily. Please make overdue appt with Dr. Acie Fredrickson before anymore refills. Thank you 1st attempt   diazepam 5 MG tablet Commonly known as: VALIUM Take 5 mg by mouth 2 (two) times daily.   esomeprazole 40 MG capsule Commonly known as: NEXIUM Take 40 mg by mouth daily.   fenofibrate 48 MG tablet Commonly known as: TRICOR Take 48 mg by mouth daily.   FreeStyle Libre 14 Day Sensor Misc 2 each by Does not apply route every 14 (fourteen) days. Use 1 sensor every 14 days to monitor blood sugar.   gabapentin 600 MG tablet Commonly known as: NEURONTIN Take 600 mg by mouth 3 (three) times daily.   glucose blood test strip Commonly known as: FREESTYLE TEST STRIPS USE TO CHECK BLOOD SUGARS DAILY. DX CODE  E11.65   Hetlioz 20 MG Caps Generic drug: Tasimelteon Take 1 capsule by mouth at bedtime.   Ingrezza 80 MG Caps Generic drug: Valbenazine Tosylate Take 80 mg by mouth at bedtime.   Jardiance 25 MG Tabs tablet Generic drug:  empagliflozin TAKE 1 TABLET BY MOUTH EVERY DAY   LAXATIVE POLYETHYLENE GLYCOL PO 17g (1 capful) in a drink   levothyroxine 25 MCG tablet Commonly known as: SYNTHROID TAKE 1 TABLET BY MOUTH DAILY BEFORE BREAKFAST.   Linzess 72 MCG capsule Generic drug: linaclotide as needed.   losartan 50 MG tablet Commonly known as: COZAAR Take 1 tablet (50 mg total) by mouth daily.   metFORMIN 750 MG 24 hr tablet Commonly known as: GLUCOPHAGE-XR Take 1 tablet (750 mg total) by mouth 2 (two) times daily.   oxyCODONE-acetaminophen 10-325 MG tablet Commonly known as: PERCOCET   pantoprazole 40 MG tablet Commonly known as: PROTONIX   QUEtiapine 300 MG tablet Commonly known as: SEROQUEL Take 600 mg by mouth at bedtime. One hour before bed   Roxicodone 5 MG immediate release tablet Generic drug: oxyCODONE 1 tablet   spironolactone 25 MG tablet Commonly known as: Aldactone Take 1 tablet (25 mg total) by mouth daily. Started by: Elayne Snare, MD   Victoza 18 MG/3ML Sopn Generic drug: liraglutide Inject 1.8 mg into the skin daily.   Vraylar 6 MG Caps Generic drug: Cariprazine HCl Take 6 mg by mouth at bedtime.       Allergies:  Allergies  Allergen Reactions  . Latex Itching, Rash and Other (See Comments)    Pt states she cannot use condoms - cause an infection.  Use of latex on skin is okay for short period of time.  Tape causes rash  . Sweetening Enhancer [Flavoring Agent] Nausea And Vomiting and Other (See Comments)    HEADACHES  . Ropinirole     Pt stated, "I am getting cramps in my legs - especially at night"  . Aspartame And Phenylalanine Nausea And Vomiting    HEADACHES  . Ibuprofen Other (See Comments)    HEADACHES  . Trazodone And Nefazodone  Other (See Comments)    Hallucinations     Past Medical History:  Diagnosis Date  . Anxiety   . Arthritis   . Bipolar disorder ([place])   . Broken jaw ([place])   . Broken wrist   . Chronic back pain   . Claudication ([place])    a. 12/2013 ABI's: R 0.97, L 0.94.  [name] Colon cancer ([place])   . COPD (chronic obstructive pulmonary disease) ([place])   . Depression   . Diabetic Charcot's foot ([place])   . Diabetic foot ulcer ([facility])    chronic left foot ulcer , great toe/  hx recurrent foot ulcer bilaterally  . DJD (degenerative joint disease)   . Edema of both lower extremities   . Episode of memory loss   . Family history of bladder cancer   . Family history of colon cancer   . Family history of kidney cancer   . GERD (gastroesophageal reflux disease)   . Headache(784.0)   . Hiatal hernia   . History of colon cancer, stage I dx 02-26-2015--- oncologist-- dr [name]--- per last note no recurrence   05-16-2015 s/p  Laparoscopic sigmoid colectomy w/ node bx's (negative nodes per path)  Stage I (pT1,N0,M0) Grade 2  . 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   per MRI multiple small strokes post cardiac cath. --  residual mild memory loss  . History of methicillin resistant staphylococcus aureus (MRSA)   . Hypercholesterolemia   . Hypertension   . Insomnia   . Insulin dependent type 2 diabetes mellitus, uncontrolled ([place]) dx 2004   endocrinologist-  dr Dwyane Dee--  last A1c 8.8 in Aug2018:  pt is noncompliant w/ diet, states does note eat breakfast , her first main meal in afternoon  . Neurogenic bladder   . Neuropathy, diabetic ([place])    hands and feet  . OSA (obstructive sleep apnea)    cpap intolerant  . Personality disorder ([place])   . Restless leg syndrome   . SOB (shortness of breath) on exertion   . Stroke ([place])   . SUI (stress urinary incontinence, female) S/P SLING 12-29-2011    Past Surgical History:  Procedure Laterality Date  . AMPUTATION Right 04/13/2018    Procedure: RIGHT BELOW KNEE AMPUTATION;  Surgeon: Newt Minion, MD;  Location: [facility];  Service: Orthopedics;  Laterality: Right;  . AMPUTATION Left 10/26/2018   Procedure: LEFT FOOT FIRST RAY AMPUTATION;  Surgeon: Newt Minion, MD;  Location: [facility];  Service: Orthopedics;  Laterality: Left;  . AMPUTATION TOE Left 05/10/2019   Procedure: AMPUTATION 2nd left TOE WITH METATARSAL;  Surgeon: Landis Martins, DPM;  Location: [facility];  Service: Podiatry;  Laterality: Left;  . ANTERIOR CERVICAL DECOMP/DISCECTOMY FUSION  2000   C5 - 7  . APPLICATION OF WOUND VAC Left 10/26/2018   Procedure: APPLICATION OF WOUND VAC;  Surgeon: Newt Minion, MD;  Location: [facility];  Service: Orthopedics;  Laterality: Left;  . BACK SURGERY    . CARDIAC CATHETERIZATION  05-22-2008   DR SKAINS   NO SIGNIFECANT CAD/ NORMAL LV/  EF 65%/  NO WALL MOTION ABNORMALITIES  . CARPAL TUNNEL RELEASE Right 04-25-2013  . CATARACT EXTRACTION Right 10/24/2018  . [place]; 1992  . COLON SURGERY    . COLONOSCOPY    . CYSTO N/A 04/30/2013   Procedure: CYSTOSCOPY;  Surgeon: Reece Packer, MD;  Location: [facility];  Service: Urology;  Laterality: N/A;  . CYSTOSCOPY MACROPLASTIQUE IMPLANT N/A 02/06/2018   Procedure: CYSTOSCOPY MACROPLASTIQUE IMPLANT;  Surgeon: Bjorn Loser, MD;  Location: [facility];  Service: Urology;  Laterality: N/A;  . CYSTOSCOPY WITH INJECTION  05/04/2012   Procedure: CYSTOSCOPY WITH INJECTION;  Surgeon: Reece Packer, MD;  Location: [facility];  Service: Urology;  Laterality: N/A;  MACROPLASTIQUE INJECTION  . CYSTOSCOPY WITH INJECTION  08/28/2012   Procedure: CYSTOSCOPY WITH INJECTION;  Surgeon: Reece Packer, MD;  Location[facility];  Service: Urology;  Laterality: N/A;  cysto and macroplastique   . FOOT SURGERY Bilateral    "related to [place]"  . HERNIA REPAIR  ?1996   "stomach"  . I & D EXTREMITY Left 10/25/2018   Procedure: IRRIGATION AND  DEBRIDEMENT GREAT TOE;  Surgeon: Meredith Pel, MD;  Location: [place];  Service: Orthopedics;  Laterality: Left;  . INCISION AND DRAINAGE Left 10/25/2018   GREAT TOE  . INCISION AND DRAINAGE OF WOUND Right 04/11/2018   Procedure: IRRIGATION AND DEBRIDEMENT WOUND right foot and right ankle;  Surgeon: Evelina Bucy, DPM;  Location: [facility];  Service: Podiatry;  Laterality: Right;  . KNEE ARTHROSCOPY Left   . KNEE ARTHROSCOPY W/ ALLOGRAFT IMPANT Left    graft x 2  . KNEE SURGERY     TOTAL 8 SURG'S  . LAPAROSCOPIC SIGMOID COLECTOMY N/A 04/22/2015   Procedure: LAPAROSCOPIC HAND ASSISTED SIGMOID COLECTOMY;  Surgeon: Erroll Luna, MD;  Location: [facility];  Service: General;  Laterality: N/A;  . LEFT HEART CATHETERIZATION WITH CORONARY ANGIOGRAM N/A 02/12/2014   Procedure: LEFT HEART CATHETERIZATION WITH CORONARY ANGIOGRAM;  Surgeon: Candee Furbish,  MD;  Location: [facility];  Service: Cardiovascular;  Laterality: N/A;   No angiographically significant CAD; normal LVSF, LVEDP 56mmHg,  EF 55% (new finding ef 30% myoview 01-08-2014)  . LUMBAR FUSION    . MANDIBLE FRACTURE SURGERY    . MULTIPLE LAPAROSCOPIES FOR ENDOMETRIOSIS    . PUBOVAGINAL SLING  12/29/2011   Procedure: Gaynelle Arabian;  Surgeon: Reece Packer, MD;  Location: [facility];  Service: Urology;  Laterality: N/A;  cysto and sparc sling   . PUBOVAGINAL SLING N/A 04/30/2013   Procedure: REMOVAL OF VAGINAL MESH;  Surgeon: Reece Packer, MD;  Location[facility];  Service: Urology;  Laterality: N/A;  . RECONSTURCTION OF CONGENITAL UTERUS ANOMALY  1983  . REPEAT RECONSTRUCTION ACL LEFT KNEE/ SCREWS REMOVED  03-28-2000   CADAVER GRAFT  . TOTAL ABDOMINAL HYSTERECTOMY  1997   w/BSO  . TRANSTHORACIC ECHOCARDIOGRAM  02/13/2014   ef 45%, hypokinesis base inferior and base inferolateral walls  . UPPER GI ENDOSCOPY    . WOUND DEBRIDEMENT Right 09/05/2016   Procedure: DEBRIDEMENT WOUND WITH GRAFT RIGHT FOOT;  Surgeon: Landis Martins, DPM;  Location: [place];  Service: Podiatry;  Laterality: Right;  . WRIST FRACTURE SURGERY      Family History  Problem Relation Age of Onset  . Hypertension Mother   . Diabetes Mother   . Cancer - Other Mother        lymphoma   . Cancer - Other Father        lung, bladder cancer   . Heart attack Father   . Other Father        Lynch syndrome  . Cancer - Other Brother        bladder cancer   . Bladder Cancer Paternal Aunt        Lynch syndrome  . Other Paternal Uncle        Lynch syndrome  . Other Cousin        Lynch syndrome    Social History:  reports that she has quit smoking. Her smoking use included cigarettes. She has a 92.00 pack-year smoking history. She has never used smokeless tobacco. She reports current alcohol use. She reports previous drug use.  Review of Systems -   HYPOTHYROIDISM:  She had routine lab work done by her PCP and her TSH was 8.5 She was started on levothyroxine 25 mcg daily, taking this in the evenings with her other medications  Lab Results  Component Value Date   TSH 1.30 12/22/2020   TSH 1.02 10/20/2020   TSH 8.51 (A) 06/24/2020   FREET4 0.78 12/22/2020   FREET4 0.64 10/20/2020   FREET4 0.65 03/19/2018    She has history of high triglycerides treated with fenofibrate    Lab Results  Component Value Date   CHOL 130 12/22/2020   HDL 43.70 12/22/2020   LDLCALC 51 12/22/2020   LDLDIRECT 57.0 08/22/2019   TRIG 180.0 (H) 12/22/2020   CHOLHDL 3 12/22/2020     Painful neuropathy: She has had persistent symptoms including leg pains tingling and numbness as well as difficulty with balance  Has been on gabapentin but does not get enough relief with this   Did not want Lyrica because of potential for weight gain   Foot exams: Last showed absent pedal pulses and Absent distal sensation  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 Treated with 50 mg losartan and bisoprolol 5 mg  However her blood pressure is still high despite increasing her losartan on the last visit Also potassium is low at 3.5  Also on Jardiance  BP Readings from Last 3 Encounters:  12/24/20 (!) 148/82  10/20/20 (!) 150/82  09/08/20 132/74        Examination:   BP (!) 148/82 (Cuff Size: Small)   Pulse (!) 101   Ht 5\' 5"  (1.651 m)   Wt 194 lb 6.4 oz (88.2 kg)   SpO2 92%   BMI 32.35 kg/m   Body mass index is 32.35 kg/m.     Assesment/Plan:   DIABETES  type 2, with mild obesity, insulin requiring  See history of present illness for detailed discussion of current diabetes management, blood sugar patterns and problems identified  Her A1c is improving at 5.9  Even with reducing her insulin by 5 units her blood sugars are about the same as seen on her home readings She does tend to have higher postprandial readings but the freestyle libre indicates periodic low blood sugars below 70 overnight which are apparently asymptomatic Again she does not monitor her blood sugars enough and has only 25% of available data; not much information is available between 9 PM-6 AM  She is compliant with her Victoza but did not increase the dose to 1.8 as directed She has been seen by the dietitian but still continues to eat only 1 meal a day   Recommendations:  Needs to start checking her blood sugar at least 2-3 times a day on waking up, late afternoon and bedtime at least  Consider freestyle libre version 2  To increase VICTOZA dose up to 1.8 mg  With this she will reduce her Basaglar to 10 units for the next 3 days and then STOP completely  Avoid drinks with sugar   Mild hypothyroidism: She is on 25 mcg of levothyroxine with adequate control  FATIGUE: This is recent and unclear of the etiology as she does not have any associated symptoms suggesting  systemic infection, heart failure or known electrolyte imbalance Since her white blood cells are slightly more normal will also check urinalysis She needs to call her PCP for further evaluation  HYPERTENSION and low normal potassium: She'll start Aldactone 25 mg in addition  Advised her to discuss polypharmacy with her PCP     Patient Instructions  VICTOZA 1.8 AND 10 UNITS basaglar NEXT 3 DAYS  THEN STOP BASAGLAR   Check blood sugars on waking up 7 days a week  Also check blood sugars about 2 hours after meals and do this after different meals by rotation  Recommended blood sugar levels on waking up are 90-130 and about 2 hours after meal is 130-160  Please bring your blood sugar monitor to each visit, thank you            Elayne Snare 12/24/2020, 4:38 PM

## 2020-12-26 ENCOUNTER — Other Ambulatory Visit: Payer: Self-pay | Admitting: Cardiovascular Disease

## 2021-01-01 ENCOUNTER — Telehealth: Payer: Self-pay | Admitting: Endocrinology

## 2021-01-01 NOTE — Telephone Encounter (Signed)
Patient called back about urinalysis results. Is confused as to why her WBC is elevated.  States is still tired even though she was advised to call her PCP she would like a call back at [phone].

## 2021-01-01 NOTE — Telephone Encounter (Signed)
Please see below and advise.

## 2021-01-01 NOTE — Telephone Encounter (Signed)
White cell count is not related to cancer unless she has lymphocytic leukemia, if she has a cancer doctor she needs to make an appointment anyway

## 2021-01-01 NOTE — Telephone Encounter (Signed)
Cannot tell why she has a high WBC, she needs to discuss with her PCP as it is not a diabetes related issue

## 2021-01-01 NOTE — Telephone Encounter (Signed)
She said she had called her PCP's office and they told her there would not be a Dr. available until the 17th, she wanted to know if it could have been elevated because her cancer is back?

## 2021-01-01 NOTE — Telephone Encounter (Signed)
I told her I didn't think it had anything to do with that, but I would let you know.

## 2021-01-04 ENCOUNTER — Telehealth: Payer: Self-pay | Admitting: Sports Medicine

## 2021-01-04 NOTE — Telephone Encounter (Signed)
She will have to get next available in the meantime if the swelling worsens ER or Urgent Care -Dr. Cannon Kettle

## 2021-01-04 NOTE — Telephone Encounter (Signed)
Patient called in requesting appointment with you only for swollen toes on previous amputated foot, any suggestions. Please advise

## 2021-01-14 ENCOUNTER — Other Ambulatory Visit: Payer: Self-pay | Admitting: Cardiovascular Disease

## 2021-01-15 ENCOUNTER — Other Ambulatory Visit: Payer: Self-pay | Admitting: Endocrinology

## 2021-01-21 ENCOUNTER — Encounter: Payer: Self-pay | Admitting: Sports Medicine

## 2021-01-21 ENCOUNTER — Other Ambulatory Visit: Payer: Self-pay

## 2021-01-21 ENCOUNTER — Ambulatory Visit (INDEPENDENT_AMBULATORY_CARE_PROVIDER_SITE_OTHER): Payer: Medicare Other | Admitting: Sports Medicine

## 2021-01-21 ENCOUNTER — Ambulatory Visit: Payer: Medicare Other | Admitting: Dietician

## 2021-01-21 DIAGNOSIS — E1142 Type 2 diabetes mellitus with diabetic polyneuropathy: Secondary | ICD-10-CM

## 2021-01-21 DIAGNOSIS — B351 Tinea unguium: Secondary | ICD-10-CM | POA: Diagnosis not present

## 2021-01-21 DIAGNOSIS — Z89422 Acquired absence of other left toe(s): Secondary | ICD-10-CM

## 2021-01-21 DIAGNOSIS — Z89511 Acquired absence of right leg below knee: Secondary | ICD-10-CM

## 2021-01-21 DIAGNOSIS — R609 Edema, unspecified: Secondary | ICD-10-CM

## 2021-01-21 DIAGNOSIS — M79609 Pain in unspecified limb: Secondary | ICD-10-CM

## 2021-01-21 NOTE — Progress Notes (Signed)
Subjective: [name] is a 55 y.o. female patient seen today in office for nail care on the left foot and for swelling to toes. Reports that she has notice swelling over the last 2 weeks at the toes and reports that she has been having issues with her shoe on the left foot.  FBS not recorded but last A1c was good, 5.9 on insulin and pills. Reports last visit with PCP 6 months but endocrinologist last month.   Patient Active Problem List   Diagnosis Date Noted  . Nicotine dependence 03/06/2020  . Genetic testing 03/12/2019  . Family history of colon cancer   . Family history of bladder cancer   . Family history of kidney cancer   . Postmenopausal bleeding 11/15/2018  . Dog bite of foot, sequela   . Left foot infection 10/25/2018  . Gangrenous disorder ([place]) 09/17/2018  . Cellulitis of left upper extremity   . Poorly controlled diabetes mellitus ([place])   . UTI (urinary tract infection) 05/23/2018  . Acute metabolic encephalopathy 42/59/5638  . GERD (gastroesophageal reflux disease) 05/09/2018  . Tobacco abuse 05/09/2018  . Polypharmacy 05/09/2018  . Restless leg syndrome 04/27/2018  . Anxiety 04/27/2018  . Hyponatremia 04/26/2018  . Noncompliance with dietary restriction 04/26/2018  . Postoperative cellulitis of surgical wound 04/26/2018  . At high risk for falls 04/24/2018  . Noncompliance with safety precautions 04/24/2018  . Leukocytosis 04/24/2018  . 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 PAD (peripheral artery disease) ([place])   . Poorly controlled type 2 diabetes mellitus ([place])   . Subacute osteomyelitis, right ankle and foot ([place])   . Major depressive disorder, recurrent episode ([place]) 04/11/2018  . Diabetic ulcer of right midfoot associated with type 2 diabetes mellitus, with necrosis of bone ([place])   . Abscess of ankle   . Infectious synovitis   . Diabetic foot infection ([place])   . Sepsis ([place]) 04/09/2018  . Melanocytic nevus 02/28/2018  . Lesion of labia  09/27/2017  . Vaginitis and vulvovaginitis 02/19/2016  . Cancer of sigmoid colon ([place]) 03/09/2015  . History of stroke within last year 02/12/2015  . OSA (obstructive sleep apnea) 07/04/2014  . Migraine without aura, with intractable migraine, so stated, without mention of status migrainosus 03/26/2014  . Peripheral neuropathy 03/03/2014  . Abnormal stress test 02/12/2014  . CVA (cerebral infarction) 02/12/2014  . Chest pain   . Abnormal heart rhythm   . Edema   . Hypertension   . Hyperlipemia   . Hypercholesterolemia   . Uncontrolled type 2 diabetes mellitus with hyperglycemia, with long-term current use of insulin ([place]) 07/22/2013  . Diabetic neuropathy with neurologic complication ([place]) 75/64/3329  . Diabetic polyneuropathy ([place]) 07/22/2013    Current Outpatient Medications on File Prior to Visit  Medication Sig Dispense Refill  . aspirin-acetaminophen-caffeine (EXCEDRIN MIGRAINE) 250-250-65 MG tablet Take 2 tablets by mouth every 6 (six) hours as needed for headache.    [name] atorvastatin (LIPITOR) 40 MG tablet Take 40 mg by mouth at bedtime.    . bisoprolol (ZEBETA) 5 MG tablet Take 1 tablet (5 mg total) by mouth daily. Pt will need to keep upcoming appt with Dr. Acie Fredrickson for further refills 30 tablet 0  . Continuous Blood Gluc Sensor (FREESTYLE LIBRE 14 DAY SENSOR) MISC 2 each by Does not apply route every 14 (fourteen) days. Use 1 sensor every 14 days to monitor blood sugar. 2 each 5  . diazepam (VALIUM) 5 MG tablet Take 5 mg by mouth 2 (two)  times daily.     [name] esomeprazole (NEXIUM) 40 MG capsule Take 40 mg by mouth daily.     . fenofibrate (TRICOR) 48 MG tablet Take 48 mg by mouth daily. (Patient not taking: Reported on 12/24/2020)    . gabapentin (NEURONTIN) 600 MG tablet Take 600 mg by mouth 3 (three) times daily.     [name] glucose blood (FREESTYLE TEST STRIPS) test strip USE TO CHECK BLOOD SUGARS DAILY. DX CODE E11.65 50 each 3  . HETLIOZ 20 MG CAPS Take 1 capsule by mouth at bedtime.     . INGREZZA 80 MG CAPS Take 80 mg by mouth at bedtime.    . Insulin Glargine (BASAGLAR KWIKPEN) 100 UNIT/ML INJECT 0.22 MLS (22 UNITS TOTAL) INTO THE SKIN DAILY. (Patient taking differently: Inject 15 Units into the skin daily.) 15 mL 3  . Insulin Pen Needle (BD PEN NEEDLE NANO 2ND GEN) 32G X 4 MM MISC 1 each by Does not apply route daily. Use to inject insulin daily. 100 each 1  . JARDIANCE 25 MG TABS tablet TAKE 1 TABLET BY MOUTH EVERY DAY 30 tablet 2  . levothyroxine (SYNTHROID) 25 MCG tablet TAKE 1 TABLET BY MOUTH DAILY BEFORE BREAKFAST. 90 tablet 1  . LINZESS 72 MCG capsule as needed.     . liraglutide (VICTOZA) 18 MG/3ML SOPN Inject 1.8 mg into the skin daily. 9 mL 2  . losartan (COZAAR) 50 MG tablet TAKE 1 TABLET BY MOUTH EVERY DAY 90 tablet 1  . metFORMIN (GLUCOPHAGE-XR) 750 MG 24 hr tablet Take 1 tablet (750 mg total) by mouth 2 (two) times daily. 180 tablet 1  . oxyCODONE (ROXICODONE) 5 MG immediate release tablet 1 tablet    . oxyCODONE-acetaminophen (PERCOCET) 10-325 MG tablet     . pantoprazole (PROTONIX) 40 MG tablet     . Polyethylene Glycol 3350 (LAXATIVE POLYETHYLENE GLYCOL PO) 17g (1 capful) in a drink    . QUEtiapine (SEROQUEL) 300 MG tablet Take 600 mg by mouth at bedtime. One hour before bed  1  . spironolactone (ALDACTONE) 25 MG tablet TAKE 1 TABLET (25 MG TOTAL) BY MOUTH DAILY. 30 tablet 1  . VRAYLAR 6 MG CAPS Take 6 mg by mouth at bedtime.   1   No current facility-administered medications on file prior to visit.    Allergies  Allergen Reactions  . Latex Itching, Rash and Other (See Comments)    Pt states she cannot use condoms - cause an infection.  Use of latex on skin is okay for short period of time.  Tape causes rash  . Sweetening Enhancer [Flavoring Agent] Nausea And Vomiting and Other (See Comments)    HEADACHES  . Ropinirole     Pt stated, "I am getting cramps in my legs - especially at night"  . Aspartame And Phenylalanine Nausea And Vomiting     HEADACHES  . Ibuprofen Other (See Comments)    HEADACHES  . Trazodone And Nefazodone Other (See Comments)    Hallucinations     Objective: There were no vitals filed for this visit.  General: No acute distress, AAOx3  Left foot.  Nails x3 midly elongated at toes 3-5, minimal swelling to toes likely from motion in shoes, no warmth, no drainage, no other signs of infection noted, Capillary fill time <3 seconds in all remaining digits, gross sensation absent via light touch to left foot.  No pain at the calf.  Patient is status post left first and second toe digital amputations and  status post right BKA.  Assessment and Plan:  Problem List Items Addressed This Visit      Endocrine   Diabetic polyneuropathy ([place])    Other Visit Diagnoses    Pain due to onychomycosis of nail    -  Primary   S/P amputation of lesser toe, left ([place])       Status post below knee amputation, right ([facility])       Swelling           -Patient seen and evaluated -Advised patient that swelling is likely from dependency or the shoe moving around when she is wearing it  -At no charge debrided nails x 3 on left foot using sterile nail nipper without incident -Patient to have left custom shoe adjusted and longer straps put on them; Rick to send back to company for this adjustment -Dispensed post op shoe for patient to use on left meanwhile -Return to office when custom shoe is here for re-dispensing or sooner if problems or issues arise Landis Martins, DPM

## 2021-01-28 ENCOUNTER — Encounter: Payer: Self-pay | Admitting: Cardiovascular Disease

## 2021-01-28 NOTE — Progress Notes (Signed)
Cardiology Office Note:    Date:  01/29/2021   ID:  [name], DOB May 17, 1966, MRN [id number]  PCP:  Tamsen Roers, MD  Cardiologist:    Darol Destine  Electrophysiologist:  None   Referring MD: Tamsen Roers, MD   Chief Complaint  Patient presents with  . Congestive Heart Failure  . Hypertension     Problem list 1.  Peripheral vascular disease -status post right BKA. 2.  Colon cancer -  3.  Diabetes mellitus - Dx in her 54s 4.  Chronic systolic congestive heart failure-ejection fraction 45 to 50%.  July 12, 2019   [name] is a 55 y.o. female with a hx of severe diabetes mellitus, peripheral arterial disease.    We were asked to see her today for further evaluation of her palpitations by Dr. Tamsen Roers.   She previously is a patient of Dr. Candee Furbish.  She has a history of chronic systolic congestive heart failure.  Echo in April 2019 reveled an EF of 45-50%.  Grade 2 diastolic dysfunction   Heart catheterization in February, 2015 revealed normal coronary arteries.  Has heart pounding , has been present for several months. Can last 3-5 minutes She has a right BKA.   Has to crawl to her bedroom.  Her wheelchair and her walker do not fit in her bedroom so she has to go without these devices. The palpitations seem to occur when she is upright kneeling on her knees but not when she is crawling on all fours.  Occurs on most nights but not every night.  It is associated with some shortness of breath.  Does not occur when she is seated.  She uses a walker when she is out and about.  Does not seem to happen when she is walking with a walker.  Smokes 2 ppd   November 06, 2019: Plan is seen today for a follow-up of her peripheral arterial disease. She has a history of chronic systolic congestive heart failure.  Her echocardiogram reveals EF of 45 to 50%.  She has grade 2 diastolic dysfunction. Has shortness of breath early in the am and late at night  Still smoking  - 2 PPD ,  Seems to have little - no interest in stopping  No cp   Feb. 4, 2022:  [name] is seen today for follow up of her PAD, chf, Legs feel heavy when she walks  Has had some palpitations  Felt dizzy  Occurred last month.   Lasted 1 month .   HR felt irreg.    She wore a monitor years ago  Will place an event monitor again  She had blood work on December 28 with Dr. Dwyane Dee.  Her potassium level was 3.5.  Her white blood cell count was elevated at 13.5.  Has stopped smoking cigarettes.  Used a vuse    Past Medical History:  Diagnosis Date  . Anxiety   . Arthritis   . Bipolar disorder ([place])   . Broken jaw ([place])   . Broken wrist   . Chronic back pain   . Claudication ([place])    a. 12/2013 ABI's: R 0.97, L 0.94.  [name] Colon cancer ([place])   . COPD (chronic obstructive pulmonary disease) ([place])   . Depression   . Diabetic Charcot's foot ([place])   . Diabetic foot ulcer (HCC)    chronic left foot ulcer , great toe/  hx recurrent foot ulcer bilaterally  . DJD (degenerative joint disease)   .  Edema of both lower extremities   . Episode of memory loss   . Family history of bladder cancer   . Family history of colon cancer   . Family history of kidney cancer   . GERD (gastroesophageal reflux disease)   . Headache(784.0)   . Hiatal hernia   . History of colon cancer, stage I dx 02-26-2015--- oncologist-- dr [name]--- per last note no recurrence   05-16-2015 s/p  Laparoscopic sigmoid colectomy w/ node bx's (negative nodes per path)  Stage I (pT1,N0,M0) Grade 2  . 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   per MRI multiple small strokes post cardiac cath. --  residual mild memory loss  . History of methicillin resistant staphylococcus aureus (MRSA)   . Hypercholesterolemia   . Hypertension   . Insomnia   . Insulin dependent type 2 diabetes mellitus, uncontrolled ([place]) dx 2004   endocrinologist-  dr Dwyane Dee-- last A1c 8.8 in Aug2018:  pt is noncompliant w/ diet,  states does note eat breakfast , her first main meal in afternoon  . Neurogenic bladder   . Neuropathy, diabetic ([place])    hands and feet  . OSA (obstructive sleep apnea)    cpap intolerant  . Personality disorder ([place])   . Restless leg syndrome   . SOB (shortness of breath) on exertion   . Stroke ([place])   . SUI (stress urinary incontinence, female) S/P SLING 12-29-2011    Past Surgical History:  Procedure Laterality Date  . AMPUTATION Right 04/13/2018   Procedure: RIGHT BELOW KNEE AMPUTATION;  Surgeon: Newt Minion, MD;  Location: [facility];  Service: Orthopedics;  Laterality: Right;  . AMPUTATION Left 10/26/2018   Procedure: LEFT FOOT FIRST RAY AMPUTATION;  Surgeon: Newt Minion, MD;  Location: [facility];  Service: Orthopedics;  Laterality: Left;  . AMPUTATION TOE Left 05/10/2019   Procedure: AMPUTATION 2nd left TOE WITH METATARSAL;  Surgeon: Landis Martins, DPM;  Location: [facility];  Service: Podiatry;  Laterality: Left;  . ANTERIOR CERVICAL DECOMP/DISCECTOMY FUSION  2000   C5 - 7  . APPLICATION OF WOUND VAC Left 10/26/2018   Procedure: APPLICATION OF WOUND VAC;  Surgeon: Newt Minion, MD;  Location: [place];  Service: Orthopedics;  Laterality: Left;  . BACK SURGERY    . CARDIAC CATHETERIZATION  05-22-2008   DR SKAINS   NO SIGNIFECANT CAD/ NORMAL LV/  EF 65%/  NO WALL MOTION ABNORMALITIES  . CARPAL TUNNEL RELEASE Right 04-25-2013  . CATARACT EXTRACTION Right 10/24/2018  . [place]; 1992  . COLON SURGERY    . COLONOSCOPY    . CYSTO N/A 04/30/2013   Procedure: CYSTOSCOPY;  Surgeon: Reece Packer, MD;  Location: [facility];  Service: Urology;  Laterality: N/A;  . CYSTOSCOPY MACROPLASTIQUE IMPLANT N/A 02/06/2018   Procedure: CYSTOSCOPY MACROPLASTIQUE IMPLANT;  Surgeon: Bjorn Loser, MD;  [facility];  Service: Urology;  Laterality: N/A;  . CYSTOSCOPY WITH INJECTION  05/04/2012   Procedure: CYSTOSCOPY WITH INJECTION;  Surgeon: Reece Packer, MD;   Location: [place];  Service: Urology;  Laterality: N/A;  MACROPLASTIQUE INJECTION  . CYSTOSCOPY WITH INJECTION  08/28/2012   Procedure: CYSTOSCOPY WITH INJECTION;  Surgeon: Reece Packer, MD;  Location[facility];  Service: Urology;  Laterality: N/A;  cysto and macroplastique   . FOOT SURGERY Bilateral    "related to [place]"  . HERNIA REPAIR  ?1996   "stomach"  .  I & D EXTREMITY Left 10/25/2018   Procedure: IRRIGATION AND DEBRIDEMENT GREAT TOE;  Surgeon: Meredith Pel, MD;  Location: [place];  Service: Orthopedics;  Laterality: Left;  . INCISION AND DRAINAGE Left 10/25/2018   GREAT TOE  . INCISION AND DRAINAGE OF WOUND Right 04/11/2018   Procedure: IRRIGATION AND DEBRIDEMENT WOUND right foot and right ankle;  Surgeon: Evelina Bucy, DPM;  [facility];  Service: Podiatry;  Laterality: Right;  . KNEE ARTHROSCOPY Left   . KNEE ARTHROSCOPY W/ ALLOGRAFT IMPANT Left    graft x 2  . KNEE SURGERY     TOTAL 8 SURG'S  . LAPAROSCOPIC SIGMOID COLECTOMY N/A 04/22/2015   Procedure: LAPAROSCOPIC HAND ASSISTED SIGMOID COLECTOMY;  Surgeon: Erroll Luna, MD;  Location: [facility];  Service: General;  Laterality: N/A;  . LEFT HEART CATHETERIZATION WITH CORONARY ANGIOGRAM N/A 02/12/2014   Procedure: LEFT HEART CATHETERIZATION WITH CORONARY ANGIOGRAM;  Surgeon: Candee Furbish, MD;  Location: [facility];  Service: Cardiovascular;  Laterality: N/A;   No angiographically significant CAD; normal LVSF, LVEDP 5mmHg,  EF 55% (new finding ef 30% myoview 01-08-2014)  . LUMBAR FUSION    . MANDIBLE FRACTURE SURGERY    . MULTIPLE LAPAROSCOPIES FOR ENDOMETRIOSIS    . PUBOVAGINAL SLING  12/29/2011   Procedure: Gaynelle Arabian;  Surgeon: Reece Packer, MD;  [facility];  Service: Urology;  Laterality: N/A;  cysto and sparc sling   . PUBOVAGINAL SLING N/A 04/30/2013   Procedure: REMOVAL OF VAGINAL MESH;  Surgeon: Reece Packer, MD;  [facility];  Service: Urology;  Laterality: N/A;  . RECONSTURCTION OF CONGENITAL UTERUS ANOMALY  1983  . REPEAT RECONSTRUCTION ACL LEFT KNEE/ SCREWS REMOVED  03-28-2000   CADAVER GRAFT  . TOTAL ABDOMINAL HYSTERECTOMY  1997   w/BSO  . TRANSTHORACIC ECHOCARDIOGRAM  02/13/2014   ef 45%, hypokinesis base inferior and base inferolateral walls  . UPPER GI ENDOSCOPY    . WOUND DEBRIDEMENT Right 09/05/2016   Procedure: DEBRIDEMENT WOUND WITH GRAFT RIGHT FOOT;  Surgeon: Landis Martins, DPM;  Location: [place];  Service: Podiatry;  Laterality: Right;  . WRIST FRACTURE SURGERY      Current Medications: Current Meds  Medication Sig  . aspirin-acetaminophen-caffeine (EXCEDRIN MIGRAINE) 250-250-65 MG tablet Take 2 tablets by mouth every 6 (six) hours as needed for headache.  [name] atorvastatin (LIPITOR) 40 MG tablet Take 40 mg by mouth at bedtime.  . Continuous Blood Gluc Sensor (FREESTYLE LIBRE 14 DAY SENSOR) MISC 2 each by Does not apply route every 14 (fourteen) days. Use 1 sensor every 14 days to monitor blood sugar.  . diazepam (VALIUM) 5 MG tablet Take 5 mg by mouth 2 (two) times daily.   . DULoxetine (CYMBALTA) 30 MG capsule Take 30 mg by mouth 2 (two) times daily.  [name] esomeprazole (NEXIUM) 40 MG capsule Take 40 mg by mouth daily.   . fenofibrate (TRICOR) 48 MG tablet Take 48 mg by mouth daily.  [name] gabapentin (NEURONTIN) 600 MG tablet Take 600 mg by mouth 3 (three) times daily.   [name] glucose blood (FREESTYLE TEST STRIPS) test strip USE TO CHECK BLOOD SUGARS DAILY. DX CODE E11.65  . INGREZZA 80 MG CAPS Take 80 mg by mouth at bedtime.  . Insulin Glargine (BASAGLAR KWIKPEN) 100 UNIT/ML INJECT 0.22 MLS (22 UNITS TOTAL) INTO THE SKIN DAILY.  [name] Insulin Pen Needle (BD PEN NEEDLE NANO 2ND GEN) 32G X 4 MM MISC 1 each by Does not apply  route daily. Use to inject insulin daily.  [name] JARDIANCE 25 MG TABS tablet TAKE 1 TABLET BY MOUTH EVERY DAY  . levothyroxine (SYNTHROID) 25 MCG tablet TAKE 1 TABLET BY MOUTH DAILY BEFORE  BREAKFAST.  [name] LINZESS 72 MCG capsule as needed.   . liraglutide (VICTOZA) 18 MG/3ML SOPN Inject 1.8 mg into the skin daily.  [name] losartan (COZAAR) 50 MG tablet TAKE 1 TABLET BY MOUTH EVERY DAY  . metFORMIN (GLUCOPHAGE-XR) 750 MG 24 hr tablet Take 1 tablet (750 mg total) by mouth 2 (two) times daily.  [name] oxyCODONE (OXY IR/ROXICODONE) 5 MG immediate release tablet 1 tablet  . oxyCODONE-acetaminophen (PERCOCET) 10-325 MG tablet   . pantoprazole (PROTONIX) 40 MG tablet   . Polyethylene Glycol 3350 (LAXATIVE POLYETHYLENE GLYCOL PO) 17g (1 capful) in a drink  . QUEtiapine (SEROQUEL) 300 MG tablet Take 600 mg by mouth at bedtime. One hour before bed  . spironolactone (ALDACTONE) 25 MG tablet TAKE 1 TABLET (25 MG TOTAL) BY MOUTH DAILY.  [name] tiZANidine (ZANAFLEX) 4 MG tablet Take 4 mg by mouth every 6 (six) hours as needed for muscle spasms.  [name] VRAYLAR 6 MG CAPS Take 6 mg by mouth at bedtime.   . [DISCONTINUED] bisoprolol (ZEBETA) 5 MG tablet Take 1 tablet (5 mg total) by mouth daily. Pt will need to keep upcoming appt with Dr. Acie Fredrickson for further refills     Allergies:   Latex, Sweetening enhancer [flavoring agent], Ropinirole, Aspartame and phenylalanine, Ibuprofen, and Trazodone and nefazodone   Social History   Socioeconomic History  . Marital status: Married    Spouse name: Not on file  . Number of children: 2  . Years of education: 6th  . Highest education level: Not on file  Occupational History  . Not on file  Tobacco Use  . Smoking status: Former Smoker    Packs/day: 2.00    Years: 46.00    Pack years: 92.00    Types: Cigarettes  . Smokeless tobacco: Never Used  . Tobacco comment: per pt started smoking  age 27  Vaping Use  . Vaping Use: Every day  . Devices: "tried vaping for 1 yr; didn't work out"   Substance and Sexual Activity  . Alcohol use: Yes    Comment:  "special occasions only"  . Drug use: Not Currently  . Sexual activity: Yes  Other Topics Concern  . Not on file   Social History Narrative   Patient is divorced and lives alone- independent in ADLs, Drives   Patient has two adult children- grown son and daughter who is special needs in a home   Patient is disabled since 62   Patient has a 6th grade education.   Patient is right-handed.   Depression-medication and therapist   Patient drinks 2- 3 liters of soda and drinks tea but not everyday.    Does not routinely exercise.      Patient reports that she drinks "a lot" of caffeine daily    Social Determinants of Health   Financial Resource Strain: Not on file  Food Insecurity: Not on file  Transportation Needs: Not on file  Physical Activity: Not on file  Stress: Not on file  Social Connections: Not on file     Family History: The patient's family history includes Bladder Cancer in her paternal aunt; Cancer - Other in her brother, father, and mother; Diabetes in her mother; Heart attack in her father; Hypertension in her mother; Other in her cousin, father, and paternal  uncle.  ROS:   Please see the history of present illness.     All other systems reviewed and are negative.  EKGs/Labs/Other Studies Reviewed:    The following studies were reviewed today:    Recent Labs: 12/22/2020: ALT 11; BUN 8; Creatinine, Ser 0.79; Hemoglobin 12.0; Platelets 200.0; Potassium 3.5; Sodium 136; TSH 1.30  Recent Lipid Panel    Component Value Date/Time   CHOL 130 12/22/2020 1523   TRIG 180.0 (H) 12/22/2020 1523   HDL 43.70 12/22/2020 1523   CHOLHDL 3 12/22/2020 1523   VLDL 36.0 12/22/2020 1523   LDLCALC 51 12/22/2020 1523   LDLDIRECT 57.0 08/22/2019 1406    Physical Exam: Blood pressure 124/76, pulse 86, height 5\' 5"  (1.651 m), weight 196 lb (88.9 kg), SpO2 98 %.  GEN:  Well nourished, well developed in no acute distress HEENT: Normal NECK: No JVD; No carotid bruits LYMPHATICS: No lymphadenopathy CARDIAC: RRR , no murmurs, rubs, gallops RESPIRATORY:  Clear to auscultation without rales,  wheezing or rhonchi  ABDOMEN: Soft, non-tender, non-distended MUSCULOSKELETAL:  No edema; s/p right BKA  SKIN: Warm and dry NEUROLOGIC:  Alert and oriented x 3    ECG : Feb.  4, 2022:   NSR at 86.  NS ST/T abn.   ASSESSMENT:    1. Cardiac arrhythmia, unspecified cardiac arrhythmia type   2. PAD (peripheral artery disease) (HCC)   3. Palpitations    PLAN:      1.  Palpitations :  She is having more palpitations ,  Had some lightheadedness associated with an episode.   Will place a monitor for 14 days.     2.  Chronic systolic congestive heart failure.   Stable  .  3.  Peripheral vascular disease: she  used to smoke 4 packs of cigarettes a day.  She has now stopped smoking cigarretts but is using a vape device.    Medication Adjustments/Labs and Tests Ordered: Current medicines are reviewed at length with the patient today.  Concerns regarding medicines are outlined above.  Orders Placed This Encounter  Procedures  . Basic metabolic panel  . EKG 12-Lead   Meds ordered this encounter  Medications  . bisoprolol (ZEBETA) 5 MG tablet    Sig: Take 1 tablet (5 mg total) by mouth daily.    Dispense:  90 tablet    Refill:  3     Patient Instructions  Medication Instructions:  Your physician recommends that you continue on your current medications as directed. Please refer to the Current Medication list given to you today.  *If you need a refill on your cardiac medications before your next appointment, please call your pharmacy*   Lab Work: Today: BMP If you have labs (blood work) drawn today and your tests are completely normal, you will receive your results only by: [name] MyChart Message (if you have MyChart) OR . A paper copy in the mail If you have any lab test that is abnormal or we need to change your treatment, we will call you to review the results.   Testing/Procedures: Your physician has recommended that you wear an event monitor. Event monitors are medical  devices that record the heart's electrical activity. Doctors most often [place] these monitors to diagnose arrhythmias. Arrhythmias are problems with the speed or rhythm of the heartbeat. The monitor is a small, portable device. You can wear one while you do your normal daily activities. This is usually used to diagnose what is causing palpitations/syncope (passing out).  Follow-Up: At [facility], you and your health needs are our priority.  As part of our continuing mission to provide you with exceptional heart care, we have created designated Provider Care Teams.  These Care Teams include your primary Cardiologist (physician) and Advanced Practice Providers (APPs -  Physician Assistants and Nurse Practitioners) who all work together to provide you with the care you need, when you need it.  We recommend signing up for the patient portal called "MyChart".  Sign up information is provided on this After Visit Summary.  MyChart is used to connect with patients for Virtual Visits (Telemedicine).  Patients are able to view lab/test results, encounter notes, upcoming appointments, etc.  Non-urgent messages can be sent to your provider as well.   To learn more about what you can do with MyChart, go to [email or web address].    Your next appointment:   3 month(s)  The format for your next appointment:   In person  Provider:   You will see one of the following Advanced Practice Providers on your designated Care Team:    Richardson Dopp, PA-C  Robbie Lis, [place]       Signed, Mertie Moores, MD  01/29/2021 11:39 AM    [place]

## 2021-01-29 ENCOUNTER — Encounter: Payer: Self-pay | Admitting: Radiology

## 2021-01-29 ENCOUNTER — Ambulatory Visit: Payer: Medicare Other

## 2021-01-29 ENCOUNTER — Encounter (INDEPENDENT_AMBULATORY_CARE_PROVIDER_SITE_OTHER): Payer: Self-pay

## 2021-01-29 ENCOUNTER — Other Ambulatory Visit: Payer: Self-pay | Admitting: Cardiovascular Disease

## 2021-01-29 ENCOUNTER — Encounter: Payer: Self-pay | Admitting: Cardiovascular Disease

## 2021-01-29 ENCOUNTER — Other Ambulatory Visit: Payer: Self-pay

## 2021-01-29 ENCOUNTER — Ambulatory Visit (INDEPENDENT_AMBULATORY_CARE_PROVIDER_SITE_OTHER): Payer: Medicare Other | Admitting: Cardiovascular Disease

## 2021-01-29 VITALS — BP 124/76 | HR 86 | Ht 65.0 in | Wt 196.0 lb

## 2021-01-29 DIAGNOSIS — I499 Cardiac arrhythmia, unspecified: Secondary | ICD-10-CM

## 2021-01-29 DIAGNOSIS — I739 Peripheral vascular disease, unspecified: Secondary | ICD-10-CM | POA: Diagnosis not present

## 2021-01-29 DIAGNOSIS — R002 Palpitations: Secondary | ICD-10-CM

## 2021-01-29 MED ORDER — BISOPROLOL FUMARATE 5 MG PO TABS: 5.0000 mg | ORAL_TABLET | Freq: Every day | ORAL | 3 refills | Status: DC

## 2021-01-29 NOTE — Progress Notes (Signed)
Enrolled patient for a 14 day Zio XT Monitor to be mailed to patient

## 2021-01-29 NOTE — Patient Instructions (Signed)
Medication Instructions:  Your physician recommends that you continue on your current medications as directed. Please refer to the Current Medication list given to you today.  *If you need a refill on your cardiac medications before your next appointment, please call your pharmacy*   Lab Work: Today: BMP If you have labs (blood work) drawn today and your tests are completely normal, you will receive your results only by: [name] MyChart Message (if you have MyChart) OR . A paper copy in the mail If you have any lab test that is abnormal or we need to change your treatment, we will call you to review the results.   Testing/Procedures: Your physician has recommended that you wear an event monitor. Event monitors are medical devices that record the heart's electrical activity. Doctors most often [place] these monitors to diagnose arrhythmias. Arrhythmias are problems with the speed or rhythm of the heartbeat. The monitor is a small, portable device. You can wear one while you do your normal daily activities. This is usually used to diagnose what is causing palpitations/syncope (passing out).    Follow-Up: At [facility], you and your health needs are our priority.  As part of our continuing mission to provide you with exceptional heart care, we have created designated Provider Care Teams.  These Care Teams include your primary Cardiologist (physician) and Advanced Practice Providers (APPs -  Physician Assistants and Nurse Practitioners) who all work together to provide you with the care you need, when you need it.  We recommend signing up for the patient portal called "MyChart".  Sign up information is provided on this After Visit Summary.  MyChart is used to connect with patients for Virtual Visits (Telemedicine).  Patients are able to view lab/test results, encounter notes, upcoming appointments, etc.  Non-urgent messages can be sent to your provider as well.   To learn more about what you can do with  MyChart, go to [email or web address].    Your next appointment:   3 month(s)  The format for your next appointment:   In person  Provider:   You will see one of the following Advanced Practice Providers on your designated Care Team:    Richardson Dopp, PA-C  [place], [place]

## 2021-01-30 LAB — BASIC METABOLIC PANEL
BUN/Creatinine Ratio: 11 (ref 9–23)
BUN: 9 mg/dL (ref 6–24)
CO2: 26 mmol/L (ref 20–29)
Calcium: 9.4 mg/dL (ref 8.7–10.2)
Chloride: 102 mmol/L (ref 96–106)
Creatinine, Ser: 0.8 mg/dL (ref 0.57–1.00)
GFR calc Af Amer: 96 mL/min/{1.73_m2} (ref >59)
GFR calc non Af Amer: 83 mL/min/{1.73_m2} (ref >59)
Glucose: 115 mg/dL — ABNORMAL HIGH (ref 65–99)
Potassium: 3.7 mmol/L (ref 3.5–5.2)
Sodium: 143 mmol/L (ref 134–144)

## 2021-02-04 ENCOUNTER — Telehealth: Payer: Self-pay | Admitting: Endocrinology

## 2021-02-04 NOTE — Telephone Encounter (Signed)
Patient called and is requesting that her Blood Gluc Sensor (FREESTYLE LIBRE 14 DAY SENSOR) MISC be sent to [facility] - [phone]

## 2021-02-04 NOTE — Telephone Encounter (Signed)
Patient states that she has had no sensors for 2 weeks and is unable to check her blood sugars

## 2021-02-04 NOTE — Telephone Encounter (Signed)
Message left for patient to return my call.  

## 2021-02-09 ENCOUNTER — Other Ambulatory Visit: Payer: Self-pay | Admitting: Endocrinology

## 2021-02-10 NOTE — Telephone Encounter (Signed)
Please note that the Free Style was sent in December 2021 with 5 refills to : [id number]   [address] [place] [address]  Phone:  [phone] Fax:  [phone]   Patient insurance changed and will only cover if sent to be sent to [facility] - [phone].    Patient is just requesting a change in pharmacy

## 2021-02-10 NOTE — Telephone Encounter (Signed)
Called and spoke with pt regarding sensors needing to be sent. Pt did not know where Rx needed to be sent.  Called pt's insurance and was advised [employer] is their supplier that handles PA and supply for CGM's and supplies.  Called [employer] and was advised pt needs to call and initiate order and then they can send required paperwork to the Dr's office.  Called pt back to give her [employer] number and advise order needed to be placed for process to get started. Pt verbalized understanding and will call and initiate order.

## 2021-02-10 NOTE — Telephone Encounter (Signed)
Pt returned Chan's call.   Ph# [phone]

## 2021-02-10 NOTE — Telephone Encounter (Signed)
[name] returned call [phone]

## 2021-02-12 NOTE — Telephone Encounter (Signed)
Form was given to Dr. Dwyane Dee for Signature.

## 2021-02-22 NOTE — Telephone Encounter (Signed)
[employer] called asking to get a status of paperwork. They asked if it could please be completed and sent back to them.

## 2021-02-24 NOTE — Telephone Encounter (Signed)
Pt called back --verified using [employer] supplier.

## 2021-02-24 NOTE — Telephone Encounter (Signed)
Faxed complete [employer] forms--[phone].

## 2021-02-27 ENCOUNTER — Other Ambulatory Visit: Payer: Self-pay | Admitting: Endocrinology

## 2021-03-04 ENCOUNTER — Telehealth: Payer: Self-pay | Admitting: *Deleted

## 2021-03-04 ENCOUNTER — Other Ambulatory Visit: Payer: Self-pay | Admitting: *Deleted

## 2021-03-04 MED ORDER — INSULIN GLARGINE 100 UNIT/ML SOLOSTAR PEN: 22.0000 [IU] | PEN_INJECTOR | Freq: Every day | SUBCUTANEOUS | 3 refills | Status: DC

## 2021-03-04 NOTE — Telephone Encounter (Signed)
Notifies pt [name] is not covered by insurance and sent the Lantus solastar to the pharmacy by Dr. Dwyane Dee. Pt stated Dr. Dwyane Dee D/C basaglar for less insulin but still need to take the lantus. Please advise.

## 2021-03-05 NOTE — Telephone Encounter (Signed)
Notified pt no need the lantus since Basaglar D/C per Dr. Dwyane Dee. Pt stated sugar reading below 130 but have not check this morning.

## 2021-03-05 NOTE — Telephone Encounter (Signed)
If she has stopped the Basaglar then she does not need the Lantus.  Not sure why the pharmacy is requesting refill on Basaglar.  Is her blood sugar below 130 in the morning?

## 2021-03-08 ENCOUNTER — Ambulatory Visit: Payer: Medicare Other | Admitting: Endocrinology

## 2021-03-11 ENCOUNTER — Telehealth: Payer: Self-pay | Admitting: Endocrinology

## 2021-03-11 NOTE — Telephone Encounter (Signed)
[name] with [place] [facility] called to see if we received the request for Letter of medical necessity for pt that was sent on 3/3? She states she will resend it again today.

## 2021-03-11 NOTE — Telephone Encounter (Signed)
Resent the [employer] [facility] [place] complete for for 4th times--[phone]

## 2021-03-15 ENCOUNTER — Other Ambulatory Visit: Payer: Self-pay | Admitting: Endocrinology

## 2021-03-17 ENCOUNTER — Other Ambulatory Visit: Payer: Self-pay | Admitting: Endocrinology

## 2021-03-31 ENCOUNTER — Other Ambulatory Visit: Payer: Self-pay | Admitting: Endocrinology

## 2021-04-01 LAB — HM DIABETES EYE EXAM

## 2021-04-20 ENCOUNTER — Other Ambulatory Visit: Payer: Self-pay | Admitting: Endocrinology

## 2021-04-26 ENCOUNTER — Encounter: Payer: Self-pay | Admitting: Endocrinology

## 2021-05-03 ENCOUNTER — Ambulatory Visit: Payer: Medicare Other | Admitting: Physician Assistant

## 2021-05-03 NOTE — Progress Notes (Deleted)
Cardiology Office Note:    Date:  05/03/2021   ID:  [name], DOB Jan 28, 1966, MRN [id number]  PCP:  Tamsen Roers, MD   [facility] Providers Cardiologist:  Mertie Moores, MD { Click to update primary MD,subspecialty MD or APP then REFRESH:1}    Referring MD: Tamsen Roers, MD   Chief Complaint:  No chief complaint on file.    Patient Profile:    [name] is a 55 y.o. 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 Left ventricular ejection fraction, by visual estimation, is 45 to  50%. The left ventricle has mildly decreased function. Left ventricular  septal wall thickness was mildly increased. There is no left ventricular  hypertrophy.  2. Moderately dilated left ventricular internal cavity size.  3. Diffuse hypokinesis worse in the inferior base.  4. Global right ventricle has normal systolic function.The right  ventricular size is normal. No increase in right ventricular wall  thickness.  5. Left atrial size was normal.  6. Right atrial size was normal.  7. Mild mitral annular calcification.  8. The mitral valve is normal in structure. Mild mitral valve  regurgitation. No evidence of mitral stenosis.  9. The tricuspid valve is normal in structure. Tricuspid valve  regurgitation is trivial.  10. The aortic valve is tricuspid. Aortic valve regurgitation is not  visualized. Mild aortic valve sclerosis without stenosis.  11. The pulmonic valve was grossly normal. Pulmonic valve regurgitation is  trivial.  12. The inferior vena cava  is normal in size with greater than 50%  respiratory variability, suggesting right atrial pressure of 3 mmHg.   Carotid US 02/13/14 Right: 1-39% mid ICA stenosis.  Cardiac catheterization 02/12/14 ANGIOGRAPHIC DATA:   Left main: No angiographically significant disease, branches into LAD and circumflex and large ramus branch. Left anterior descending (LAD): No angiographically significant disease, there is one major diagonal branch, no significant disease. The LAD continues to the apex, tapers, small in caliber. Circumflex artery (CIRC): Both ramus as well as circumflex branch, widely patent with no angiographically significant disease. Right coronary artery (RCA): No angiographically significant disease, dominant vessel giving rise to posterior descending artery. LEFT VENTRICULOGRAM:  Left ventricular angiogram was done in the 30 RAO projection and revealed normal left ventricular wall motion and systolic function with an estimated ejection fraction of 55 %. Nuclear stress test ejection fraction, falsely low.  Holter 12/2013 PACs, PVCs  History of Present Illness: [name] was last seen by Dr. Acie Fredrickson in 2/22.  A Zio monitor was ordered for palpitations but no results are available yet. She returns for f/u.  ***        Past Medical History:  Diagnosis Date  . Anxiety   . Arthritis   . Bipolar disorder ([place])   . Broken jaw ([place])   . Broken wrist   . Chronic back pain   . Claudication ([place])    a. 12/2013 ABI's: R 0.97, L 0.94.  [name] Colon cancer ([place])   .  COPD (chronic obstructive pulmonary disease) ([place])   . Depression   . Diabetic Charcot's foot ([place])   . Diabetic foot ulcer ([facility])    chronic left foot ulcer , great toe/  hx recurrent foot ulcer bilaterally  . DJD (degenerative joint disease)   . Edema of both lower extremities   . Episode of memory loss   . Family history of bladder cancer   . Family history of colon cancer   . Family history of kidney cancer   . GERD  (gastroesophageal reflux disease)   . Headache(784.0)   . Hiatal hernia   . History of colon cancer, stage I dx 02-26-2015--- oncologist-- dr [name]--- per last note no recurrence   05-16-2015 s/p  Laparoscopic sigmoid colectomy w/ node bx's (negative nodes per path)  Stage I (pT1,N0,M0) Grade 2  . 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   per MRI multiple small strokes post cardiac cath. --  residual mild memory loss  . History of methicillin resistant staphylococcus aureus (MRSA)   . Hypercholesterolemia   . Hypertension   . Insomnia   . Insulin dependent type 2 diabetes mellitus, uncontrolled ([place]) dx 2004   endocrinologist-  dr Dwyane Dee-- last A1c 8.8 in Aug2018:  pt is noncompliant w/ diet, states does note eat breakfast , her first main meal in afternoon  . Neurogenic bladder   . Neuropathy, diabetic ([place])    hands and feet  . OSA (obstructive sleep apnea)    cpap intolerant  . Personality disorder ([place])   . Restless leg syndrome   . SOB (shortness of breath) on exertion   . Stroke ([place])   . SUI (stress urinary incontinence, female) S/P SLING 12-29-2011    Current Medications: No outpatient medications have been marked as taking for the 05/03/21 encounter (Appointment) with Richardson Dopp T, PA-C.     Allergies:   Latex, Sweetening enhancer [flavoring agent], Ropinirole, Aspartame and phenylalanine, Ibuprofen, and Trazodone and nefazodone   Social History   Tobacco Use  . Smoking status: Former Smoker    Packs/day: 2.00    Years: 46.00    Pack years: 92.00    Types: Cigarettes  . Smokeless tobacco: Never Used  . Tobacco comment: per pt started smoking  age 55  Vaping Use  . Vaping Use: Every day  . Devices: "tried vaping for 1 yr; didn't work out"   Substance Use Topics  . Alcohol use: Yes    Comment:  "special occasions only"  . Drug use: Not Currently     Family Hx: The patient's family history includes Bladder Cancer in her paternal  aunt; Cancer - Other in her brother, father, and mother; Diabetes in her mother; Heart attack in her father; Hypertension in her mother; Other in her cousin, father, and paternal uncle.  ROS   EKGs/Labs/Other Test Reviewed:    EKG:  EKG is *** ordered today.  The ekg ordered today demonstrates ***  Recent Labs: 12/22/2020: ALT 11; Hemoglobin 12.0; Platelets 200.0; TSH 1.30 01/29/2021: BUN 9; Creatinine, Ser 0.80; Potassium 3.7; Sodium 143   Recent Lipid Panel Lab Results  Component Value Date/Time   CHOL 130 12/22/2020 03:23 PM   TRIG 180.0 (H) 12/22/2020 03:23 PM   HDL 43.70 12/22/2020 03:23 PM   CHOLHDL 3 12/22/2020 03:23 PM   LDLCALC 51 12/22/2020 03:23 PM   LDLDIRECT 57.0 08/22/2019 02:06 PM      Risk Assessment/Calculations:   {Does this patient have ATRIAL  FIBRILLATION?:[phone]}  Physical Exam:    VS:  There were no vitals taken for this visit.    Wt Readings from Last 3 Encounters:  01/29/21 196 lb (88.9 kg)  12/24/20 194 lb 6.4 oz (88.2 kg)  12/22/20 195 lb (88.5 kg)     Physical Exam ***     ASSESSMENT & PLAN:    ***  {Are you ordering a CV Procedure (e.g. stress test, cath, DCCV, TEE, etc)?   Press F2        :[id number]}    Dispo:  No follow-ups on file.   Medication Adjustments/Labs and Tests Ordered: Current medicines are reviewed at length with the patient today.  Concerns regarding medicines are outlined above.  Tests Ordered: No orders of the defined types were placed in this encounter.  Medication Changes: No orders of the defined types were placed in this encounter.   Signed, Richardson Dopp, PA-C  05/03/2021 1:37 PM    [facility] [address], [place], [place]  [address] Phone: [phone]; Fax: [phone]

## 2021-05-19 ENCOUNTER — Telehealth: Payer: Self-pay | Admitting: Endocrinology

## 2021-05-19 NOTE — Telephone Encounter (Signed)
joie- [employer] [place]  Will be sending a fax over for medical necessity.

## 2021-05-24 ENCOUNTER — Other Ambulatory Visit: Payer: Self-pay | Admitting: Endocrinology

## 2021-06-18 ENCOUNTER — Telehealth: Payer: Medicare Other | Admitting: Sports Medicine

## 2021-06-18 NOTE — Telephone Encounter (Signed)
Had message on my desk stating pt was checking on diabetic shoes.  I called pt and pts daughter answered and I told pts daughter that pt has not seen Dr Dwyane Dee since December and it has to be within 6 months. Pt missed appt in march with Dr Dwyane Dee it looks like). Pts daughter is to let pt know she needs to get an appt with Dr Dwyane Dee and let me know so I can fax paperwork for the diabetic shoes.

## 2021-07-04 ENCOUNTER — Other Ambulatory Visit: Payer: Self-pay | Admitting: Endocrinology

## 2021-07-23 ENCOUNTER — Ambulatory Visit
Admission: EM | Admit: 2021-07-23 | Discharge: 2021-07-23 | Disposition: A | Discharge status: Home / Self Care | Payer: Medicare Other | Attending: Physician Assistant | Admitting: Physician Assistant

## 2021-07-23 ENCOUNTER — Other Ambulatory Visit: Payer: Self-pay

## 2021-07-23 DIAGNOSIS — R1013 Epigastric pain: Secondary | ICD-10-CM | POA: Diagnosis present

## 2021-07-23 DIAGNOSIS — K30 Functional dyspepsia: Secondary | ICD-10-CM

## 2021-07-23 DIAGNOSIS — K219 Gastro-esophageal reflux disease without esophagitis: Secondary | ICD-10-CM | POA: Diagnosis present

## 2021-07-23 LAB — POCT URINALYSIS DIP (MANUAL ENTRY)
Bilirubin, UA: NEGATIVE
Glucose, UA: 500 mg/dL — AB
Ketones, POC UA: NEGATIVE mg/dL
Nitrite, UA: NEGATIVE
Protein Ur, POC: NEGATIVE mg/dL
Spec Grav, UA: 1.01 (ref 1.010–1.025)
Urobilinogen, UA: 0.2 E.U./dL
pH, UA: 7 (ref 5.0–8.0)

## 2021-07-23 MED ORDER — SUCRALFATE 1 G PO TABS: 1.0000 g | ORAL_TABLET | Freq: Two times a day (BID) | ORAL | 0 refills | Status: DC

## 2021-07-23 NOTE — ED Provider Notes (Signed)
[facility]    CSN: [id number] Arrival date & time: 07/23/21  1541      History   Chief Complaint Chief Complaint  Patient presents with   Heartburn    HPI [name] is a 55 y.o. female.   Patient presents today with a 3-week history of worsening epigastric abdominal pain.  She reports abdominal pain is rated 6 on a 0-10 pain scale, localized to epigastrium without radiation, described as burning with radiation towards back, no aggravating leaving factors identified.  She reports significant nausea as well as regurgitation of food but denies any vomiting.  She does report some increased loose stools but denies any melena or hematochezia.  She denies any recent medication changes.  She is taking GLP-1 agonist Victoza but has been stable on this medication for a while.  She denies any history of pancreatitis and denies any significant alcohol use.  She does not believe that she has elevated triglycerides.  She has been taking omeprazole as prescribed but this has not provided any relief of symptoms.  She is also been taking Pepto-Bismol and over-the-counter antacids with no relief of symptoms.  She has a history of colon cancer and is scheduled for repeat colonoscopy and to see her GI specialist next week but she has not yet talked to them about current symptoms or had an endoscopy recently.  She denies history of H. pylori.   Past Medical History:  Diagnosis Date   Anxiety    Arthritis    Bipolar disorder ([place])    Broken jaw ([place])    Broken wrist    Chronic back pain    Claudication ([place])    a. 12/2013 ABI's: R 0.97, L 0.94.   Colon cancer ([place])    COPD (chronic obstructive pulmonary disease) (HCC)    Depression    Diabetic Charcot's foot (HCC)    Diabetic foot ulcer ([place])    chronic left foot ulcer , great toe/  hx recurrent foot ulcer bilaterally   DJD (degenerative joint disease)    Edema of both lower extremities    Episode of memory loss    Family history  of bladder cancer    Family history of colon cancer    Family history of kidney cancer    GERD (gastroesophageal reflux disease)    Headache(784.0)    Hiatal hernia    History of colon cancer, stage I dx 02-26-2015--- oncologist-- dr [name]--- per last note no recurrence   05-16-2015 s/p  Laparoscopic sigmoid colectomy w/ node bx's (negative nodes per path)  Stage I (pT1,N0,M0) Grade 2   History of CVA with residual deficit 02-12-2014  post op cardiac cath.   per MRI multiple small strokes post cardiac cath. --  residual mild memory loss   History of methicillin resistant staphylococcus aureus (MRSA)    Hypercholesterolemia    Hypertension    Insomnia    Insulin dependent type 2 diabetes mellitus, uncontrolled ([place]) dx 2004   endocrinologist-  dr Dwyane Dee-- last A1c 8.8 in Aug2018:  pt is noncompliant w/ diet, states does note eat breakfast , her first main meal in afternoon   Neurogenic bladder    Neuropathy, diabetic (HCC)    hands and feet   OSA (obstructive sleep apnea)    cpap intolerant   Personality disorder (HCC)    Restless leg syndrome    SOB (shortness of breath) on exertion    Stroke (HCC)    SUI (stress urinary incontinence, female) S/P  SLING 12-29-2011    Patient Active Problem List   Diagnosis Date Noted   Palpitations 01/29/2021   Nicotine dependence 03/06/2020   Genetic testing 03/12/2019   Family history of colon cancer    Family history of bladder cancer    Family history of kidney cancer    Postmenopausal bleeding 11/15/2018   Dog bite of foot, sequela    Left foot infection 10/25/2018   Gangrenous disorder ([place]) 09/17/2018   Cellulitis of left upper extremity    Poorly controlled diabetes mellitus ([place])    UTI (urinary tract infection) 08/65/7846   Acute metabolic encephalopathy 96/29/5284   GERD (gastroesophageal reflux disease) 05/09/2018   Tobacco abuse 05/09/2018   Polypharmacy 05/09/2018   Restless leg syndrome 04/27/2018   Anxiety 04/27/2018    Hyponatremia 04/26/2018   Noncompliance with dietary restriction 04/26/2018   Postoperative cellulitis of surgical wound 04/26/2018   At high risk for falls 04/24/2018   Noncompliance with safety precautions 04/24/2018   Leukocytosis 04/24/2018   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   PAD (peripheral artery disease) ([place])    Poorly controlled type 2 diabetes mellitus ([place])    Subacute osteomyelitis, right ankle and foot ([place])    Major depressive disorder, recurrent episode ([place]) 04/11/2018   Diabetic ulcer of right midfoot associated with type 2 diabetes mellitus, with necrosis of bone ([place])    Abscess of ankle    Infectious synovitis    Diabetic foot infection ([place])    Sepsis ([place]) 04/09/2018   Melanocytic nevus 02/28/2018   Lesion of labia 09/27/2017   Vaginitis and vulvovaginitis 02/19/2016   Cancer of sigmoid colon ([place]) 03/09/2015   History of stroke within last year 02/12/2015   OSA (obstructive sleep apnea) 07/04/2014   Migraine without aura, with intractable migraine, so stated, without mention of status migrainosus 03/26/2014   Peripheral neuropathy 03/03/2014   Abnormal stress test 02/12/2014   CVA (cerebral infarction) 02/12/2014   Chest pain    Abnormal heart rhythm    Edema    Hypertension    Hyperlipemia    Hypercholesterolemia    Uncontrolled type 2 diabetes mellitus with hyperglycemia, with long-term current use of insulin ([place]) 07/22/2013   Diabetic neuropathy with neurologic complication ([place]) 13/24/4010   Diabetic polyneuropathy ([place]) 07/22/2013    Past Surgical History:  Procedure Laterality Date   AMPUTATION Right 04/13/2018   Procedure: RIGHT BELOW KNEE AMPUTATION;  Surgeon: Newt Minion, MD;  Location: [place];  Service: Orthopedics;  Laterality: Right;   AMPUTATION Left 10/26/2018   Procedure: LEFT FOOT FIRST RAY AMPUTATION;  Surgeon: Newt Minion, MD;  Location: [place];  Service: Orthopedics;  Laterality: Left;   AMPUTATION TOE Left 05/10/2019    Procedure: AMPUTATION 2nd left TOE WITH METATARSAL;  Surgeon: Landis Martins, DPM;  Location: [place];  Service: Podiatry;  Laterality: Left;   ANTERIOR CERVICAL DECOMP/DISCECTOMY FUSION  2725   C5 - 7   APPLICATION OF WOUND VAC Left 10/26/2018   Procedure: APPLICATION OF WOUND VAC;  Surgeon: Newt Minion, MD;  Location: [place];  Service: Orthopedics;  Laterality: Left;   BACK SURGERY     CARDIAC CATHETERIZATION  05-22-2008   DR SKAINS   NO SIGNIFECANT CAD/ NORMAL LV/  EF 65%/  NO WALL MOTION ABNORMALITIES   CARPAL TUNNEL RELEASE Right 04-25-2013   CATARACT EXTRACTION Right 10/24/2018   CESAREAN SECTION  1989; [place] N/A 04/30/2013  Procedure: CYSTOSCOPY;  Surgeon: Reece Packer, MD;  Location: [facility];  Service: Urology;  Laterality: N/A;   CYSTOSCOPY MACROPLASTIQUE IMPLANT N/A 02/06/2018   Procedure: CYSTOSCOPY MACROPLASTIQUE IMPLANT;  Surgeon: Bjorn Loser, MD;  Location[facility];  Service: Urology;  Laterality: N/A;   CYSTOSCOPY WITH INJECTION  05/04/2012   Procedure: CYSTOSCOPY WITH INJECTION;  Surgeon: Reece Packer, MD;  Location: [place];  Service: Urology;  Laterality: N/A;  MACROPLASTIQUE INJECTION   CYSTOSCOPY WITH INJECTION  08/28/2012   Procedure: CYSTOSCOPY WITH INJECTION;  Surgeon: Reece Packer, MD;  Location: [place];  Service: Urology;  Laterality: N/A;  cysto and macroplastique    FOOT SURGERY Bilateral    "related to [place]"   [place]   "stomach"   I & D EXTREMITY Left 10/25/2018   Procedure: IRRIGATION AND DEBRIDEMENT GREAT TOE;  Surgeon: Meredith Pel, MD;  Location: [place];  Service: Orthopedics;  Laterality: Left;   INCISION AND DRAINAGE Left 10/25/2018   GREAT TOE   INCISION AND DRAINAGE OF WOUND Right 04/11/2018   Procedure: IRRIGATION AND DEBRIDEMENT WOUND right foot and right ankle;  Surgeon: Evelina Bucy, DPM;  Location: [facility];   Service: Podiatry;  Laterality: Right;   KNEE ARTHROSCOPY Left    KNEE ARTHROSCOPY W/ ALLOGRAFT IMPANT Left    graft x 2   KNEE SURGERY     TOTAL 8 SURG'S   LAPAROSCOPIC SIGMOID COLECTOMY N/A 04/22/2015   Procedure: LAPAROSCOPIC HAND ASSISTED SIGMOID COLECTOMY;  Surgeon: Erroll Luna, MD;  Location: [place];  Service: General;  Laterality: N/A;   LEFT HEART CATHETERIZATION WITH CORONARY ANGIOGRAM N/A 02/12/2014   Procedure: LEFT HEART CATHETERIZATION WITH CORONARY ANGIOGRAM;  Surgeon: Candee Furbish, MD;  Location: [facility] C[facility];  Service: Cardiovascular;  Laterality: N/A;   No angiographically significant CAD; normal LVSF, LVEDP 24mmHg,  EF 55% (new finding ef 30% myoview 01-08-2014)   LUMBAR FUSION     MANDIBLE FRACTURE SURGERY     MULTIPLE LAPAROSCOPIES FOR ENDOMETRIOSIS     PUBOVAGINAL SLING  12/29/2011   Procedure: Gaynelle Arabian;  Surgeon: Reece Packer, MD;  Location: [facility];  Service: Urology;  Laterality: N/A;  cysto and sparc sling    PUBOVAGINAL SLING N/A 04/30/2013   Procedure: REMOVAL OF VAGINAL MESH;  Surgeon: Reece Packer, MD;  Location[facility];  Service: Urology;  Laterality: N/A;   RECONSTURCTION OF CONGENITAL UTERUS ANOMALY  1983   REPEAT RECONSTRUCTION ACL LEFT KNEE/ SCREWS REMOVED  03-28-2000   CADAVER GRAFT   TOTAL ABDOMINAL HYSTERECTOMY  1997   w/BSO   TRANSTHORACIC ECHOCARDIOGRAM  02/13/2014   ef 45%, hypokinesis base inferior and base inferolateral walls   UPPER GI ENDOSCOPY     WOUND DEBRIDEMENT Right 09/05/2016   Procedure: DEBRIDEMENT WOUND WITH GRAFT RIGHT FOOT;  Surgeon: Landis Martins, DPM;  Location: [place];  Service: Podiatry;  Laterality: Right;   WRIST FRACTURE SURGERY      OB History   No obstetric history on file.      Home Medications    Prior to Admission medications   Medication Sig Start Date End Date Taking? Authorizing Provider  sucralfate (CARAFATE) 1 g tablet Take 1 tablet (1 g total) by mouth 2 (two) times  daily. 07/23/21  Yes Davonn Flanery, Derry Skill, PA-C  aspirin-acetaminophen-caffeine (EXCEDRIN MIGRAINE) 250-250-65 MG tablet Take 2 tablets by mouth every 6 (six) hours as needed for headache.  [provider]  atorvastatin (LIPITOR) 40 MG tablet Take 40 mg by mouth at bedtime. 09/30/19   [provider]  bisoprolol (ZEBETA) 5 MG tablet Take 1 tablet (5 mg total) by mouth daily. 01/29/21   Nahser, Wonda Cheng, MD  Continuous Blood Gluc Sensor (FREESTYLE LIBRE 14 DAY SENSOR) MISC 2 each by Does not apply route every 14 (fourteen) days. Use 1 sensor every 14 days to monitor blood sugar. 12/07/20   Elayne Snare, MD  diazepam (VALIUM) 5 MG tablet Take 5 mg by mouth 2 (two) times daily.  10/07/18   [provider]  DULoxetine (CYMBALTA) 30 MG capsule Take 30 mg by mouth 2 (two) times daily. 01/09/21   [provider]  esomeprazole (NEXIUM) 40 MG capsule Take 40 mg by mouth daily.  10/07/18   [provider]  fenofibrate (TRICOR) 48 MG tablet Take 48 mg by mouth daily. 07/15/19   [provider]  gabapentin (NEURONTIN) 600 MG tablet Take 600 mg by mouth 3 (three) times daily.     [provider]  glucose blood (FREESTYLE TEST STRIPS) test strip USE TO CHECK BLOOD SUGARS DAILY. DX CODE E11.65 08/09/18   Elayne Snare, MD  INGREZZA 80 MG CAPS Take 80 mg by mouth at bedtime. 05/01/18   [provider]  insulin glargine (LANTUS) 100 UNIT/ML Solostar Pen Inject 22 Units into the skin daily. INJECT 0.22 MLS (22 UNITS TOTAL) INTO THE SKIN DAILY 03/04/21   Elayne Snare, MD  Insulin Pen Needle (BD PEN NEEDLE NANO 2ND GEN) 32G X 4 MM MISC 1 each by Does not apply route daily. Use to inject insulin daily. 01/01/20   Elayne Snare, MD  JARDIANCE 25 MG TABS tablet TAKE 1 TABLET BY MOUTH EVERY DAY 04/20/21   Elayne Snare, MD  levothyroxine (SYNTHROID) 25 MCG tablet TAKE 1 TABLET BY MOUTH EVERY DAY BEFORE BREAKFAST 03/31/21   Elayne Snare, MD  LINZESS 72 MCG capsule as needed.   05/15/19   [provider]  losartan (COZAAR) 50 MG tablet TAKE 1 TABLET BY MOUTH EVERY DAY 01/15/21   Elayne Snare, MD  metFORMIN (GLUCOPHAGE-XR) 750 MG 24 hr tablet TAKE 1 TABLET (750 MG TOTAL) BY MOUTH 2 (TWO) TIMES DAILY. 03/17/21   Elayne Snare, MD  oxyCODONE-acetaminophen [facility]) 10-325 MG tablet  05/18/19   [provider]  pantoprazole (PROTONIX) 40 MG tablet  05/07/19   [provider]  Polyethylene Glycol 3350 (LAXATIVE POLYETHYLENE GLYCOL PO) 17g (1 capful) in a drink 11/03/17   [provider]  QUEtiapine (SEROQUEL) 300 MG tablet Take 600 mg by mouth at bedtime. One hour before bed 06/12/18   [provider]  spironolactone (ALDACTONE) 25 MG tablet TAKE 1 TABLET (25 MG TOTAL) BY MOUTH DAILY. 02/27/21   Elayne Snare, MD  tiZANidine (ZANAFLEX) 4 MG tablet Take 4 mg by mouth every 6 (six) hours as needed for muscle spasms.    [provider]  VICTOZA 18 MG/3ML SOPN INJECT 1.8 MG UNDER THE SKIN ONCE DAILY 05/24/21   Elayne Snare, MD  VRAYLAR 6 MG CAPS Take 6 mg by mouth at bedtime.  05/31/18   [provider]    Family History Family History  Problem Relation Age of Onset   Hypertension Mother    Diabetes Mother    Cancer - Other Mother        lymphoma    Cancer - Other Father        lung, bladder cancer  Heart attack Father    Other Father        Lynch syndrome   Cancer - Other Brother        bladder cancer    Bladder Cancer Paternal Aunt        Lynch syndrome   Other Paternal Uncle        Lynch syndrome   Other Cousin        Lynch syndrome    Social History Social History   Tobacco Use   Smoking status: Former    Packs/day: 2.00    Years: 46.00    Pack years: 92.00    Types: Cigarettes   Smokeless tobacco: Never   Tobacco comments:    per pt started smoking  age 52  Vaping Use   Vaping Use: Every day   Devices: "tried vaping for 1 yr; didn't work out"   Substance Use Topics   Alcohol use: Not Currently     Comment:  "special occasions only"   Drug use: Not Currently     Allergies   Latex, Sweetening enhancer [flavoring agent], Ropinirole, Aspartame and phenylalanine, Ibuprofen, and Trazodone and nefazodone   Review of Systems Review of Systems  Constitutional:  Positive for activity change and appetite change. Negative for fatigue and fever.  Respiratory:  Negative for cough and shortness of breath.   Cardiovascular:  Negative for chest pain.  Gastrointestinal:  Positive for abdominal pain, diarrhea and nausea. Negative for constipation and vomiting.  Genitourinary:  Positive for dysuria. Negative for frequency and urgency.  Neurological:  Negative for dizziness, light-headedness and headaches.    Physical Exam Triage Vital Signs ED Triage Vitals  Enc Vitals Group     BP 07/23/21 1756 (!) 170/85     Pulse Rate 07/23/21 1756 99     Resp 07/23/21 1756 18     Temp 07/23/21 1756 98.5 F (36.9 C)     Temp Source 07/23/21 1756 Oral     SpO2 07/23/21 1756 98 %     Weight --      Height --      Head Circumference --      Peak Flow --      Pain Score 07/23/21 1757 6     Pain Loc --      Pain Edu? --      Excl. in [place]? --    No data found.  Updated Vital Signs BP (!) 170/85 (BP Location: Left Arm)   Pulse 99   Temp 98.5 F (36.9 C) (Oral)   Resp 18   SpO2 98%   Visual Acuity Right Eye Distance:   Left Eye Distance:   Bilateral Distance:    Right Eye Near:   Left Eye Near:    Bilateral Near:     Physical Exam Vitals reviewed.  Constitutional:      General: She is awake. She is not in acute distress.    Appearance: Normal appearance. She is normal weight. She is not ill-appearing.     Comments: Very pleasant female appears stated age in no acute distress sitting comfortably in exam room  HENT:     Head: Normocephalic and atraumatic.     Mouth/Throat:     Mouth: Mucous membranes are moist.     Pharynx: Uvula midline. No oropharyngeal exudate or posterior  oropharyngeal erythema.  Cardiovascular:     Rate and Rhythm: Normal rate and regular rhythm.     Heart sounds: Normal heart sounds, S1 normal and  S2 normal. No murmur heard. Pulmonary:     Effort: Pulmonary effort is normal.     Breath sounds: Normal breath sounds. No wheezing, rhonchi or rales.     Comments: Clear to auscultation bilaterally Abdominal:     General: Bowel sounds are normal.     Palpations: Abdomen is soft.     Tenderness: There is abdominal tenderness in the epigastric area. There is no right CVA tenderness, left CVA tenderness, guarding or rebound.     Comments: Tenderness to palpation in epigastrium.  No evidence of acute abdomen on physical exam.  No CVA tenderness.  Psychiatric:        Behavior: Behavior is cooperative.     UC Treatments / Results  Labs (all labs ordered are listed, but only abnormal results are displayed) Labs Reviewed  POCT URINALYSIS DIP (MANUAL ENTRY) - Abnormal; Notable for the following components:      Result Value   Color, UA light yellow (*)    Clarity, UA cloudy (*)    Glucose, UA =500 (*)    Blood, UA trace-lysed (*)    Leukocytes, UA Small (1+) (*)    All other components within normal limits  URINE CULTURE  CBC WITH DIFFERENTIAL/PLATELET  COMPREHENSIVE METABOLIC PANEL  LIPASE    EKG   Radiology No results found.  Procedures Procedures (including critical care time)  Medications Ordered in UC Medications - No data to display  Initial Impression / Assessment and Plan / UC Course  I have reviewed the triage vital signs and the nursing notes.  Pertinent labs & imaging results that were available during my care of the patient were reviewed by me and considered in my medical decision making (see chart for details).      UA did show some evidence of infection the patient denies any significant symptoms we will defer initiation of antibiotics until urine culture results are obtained.  Basic labs including CBC, CMP,  lipase obtained today-results pending.  Recommended she rest and drink plenty of fluid.  She is to eat a bland diet.  Discussed that we are unable to obtain imaging so if she continues to have abdominal pain she needs to go to the emergency room for further evaluation and management and to consider CT scan.  She is scheduled to see her gastroenterologist next week, strongly encouraged to keep this appointment.  Discussed alarm symptoms that warrant emergent evaluation.  Strict return precautions given to which patient expressed understanding.  Final Clinical Impressions(s) / UC Diagnoses   Final diagnoses:  Epigastric abdominal pain  Indigestion  Gastroesophageal reflux disease without esophagitis     Discharge Instructions      Your urine did show some evidence of infection but given you are not having any symptoms will wait to start antibiotics until we obtain your culture results.  We will contact you if we need to start any antibiotics.  We will contact you with your lab results.  Please start Carafate twice daily to help coat your stomach.  Continue all other medications as previously prescribed.  Avoid spicy/acidic foods.  Follow-up with your gastroenterologist as scheduled next week.  If you have any worsening symptoms you need to go to the emergency room to consider CT scan.     ED Prescriptions     Medication Sig Dispense Auth. Provider   sucralfate (CARAFATE) 1 g tablet Take 1 tablet (1 g total) by mouth 2 (two) times daily. 20 tablet Mckenna Boruff, Derry Skill, PA-C  PDMP not reviewed this encounter.   Terrilee Croak, PA-C 07/23/21 1847

## 2021-07-23 NOTE — Discharge Instructions (Signed)
Your urine did show some evidence of infection but given you are not having any symptoms will wait to start antibiotics until we obtain your culture results.  We will contact you if we need to start any antibiotics.  We will contact you with your lab results.  Please start Carafate twice daily to help coat your stomach.  Continue all other medications as previously prescribed.  Avoid spicy/acidic foods.  Follow-up with your gastroenterologist as scheduled next week.  If you have any worsening symptoms you need to go to the emergency room to consider CT scan.

## 2021-07-23 NOTE — ED Triage Notes (Signed)
Pt c/o heartburn x3wks. States pain from epigastric up to neck and through to her back. States has taken OTC meds with only a few hours of relief. States has had diarrhea x3 days.

## 2021-07-24 LAB — CBC WITH DIFFERENTIAL/PLATELET

## 2021-07-24 LAB — COMPREHENSIVE METABOLIC PANEL
ALT: 10 IU/L (ref 0–32)
AST: 11 IU/L (ref 0–40)
Albumin/Globulin Ratio: 1.4 (ref 1.2–2.2)
Albumin: 4.1 g/dL (ref 3.8–4.9)
Alkaline Phosphatase: 67 IU/L (ref 44–121)
BUN/Creatinine Ratio: 11 (ref 9–23)
BUN: 9 mg/dL (ref 6–24)
Bilirubin Total: <0.2 mg/dL (ref 0.0–1.2)
CO2: 19 mmol/L — ABNORMAL LOW (ref 20–29)
Calcium: 9.5 mg/dL (ref 8.7–10.2)
Chloride: 104 mmol/L (ref 96–106)
Creatinine, Ser: 0.82 mg/dL (ref 0.57–1.00)
Globulin, Total: 2.9 g/dL (ref 1.5–4.5)
Glucose: 73 mg/dL (ref 65–99)
Potassium: 4.3 mmol/L (ref 3.5–5.2)
Sodium: 140 mmol/L (ref 134–144)
Total Protein: 7 g/dL (ref 6.0–8.5)
eGFR: 84 mL/min/{1.73_m2} (ref >59)

## 2021-07-24 LAB — LIPASE: Lipase: 33 U/L (ref 14–72)

## 2021-07-27 ENCOUNTER — Other Ambulatory Visit: Payer: Self-pay | Admitting: Physician Assistant

## 2021-07-27 ENCOUNTER — Telehealth (HOSPITAL_COMMUNITY): Payer: Self-pay | Admitting: Emergency Medicine

## 2021-07-27 DIAGNOSIS — C189 Malignant neoplasm of colon, unspecified: Secondary | ICD-10-CM

## 2021-07-27 DIAGNOSIS — R634 Abnormal weight loss: Secondary | ICD-10-CM

## 2021-07-27 LAB — URINE CULTURE: Culture: >=100000 — AB

## 2021-07-27 MED ORDER — SULFAMETHOXAZOLE-TRIMETHOPRIM 800-160 MG PO TABS: 1.0000 | ORAL_TABLET | Freq: Two times a day (BID) | ORAL | 0 refills | Status: AC

## 2021-08-04 ENCOUNTER — Telehealth: Payer: Self-pay

## 2021-08-04 NOTE — Telephone Encounter (Signed)
Inbound fax from [employer] requesting clinical notes be faxed to [phone]. Clinical notes routed via Epic

## 2021-08-09 ENCOUNTER — Ambulatory Visit
Admission: RE | Admit: 2021-08-09 | Discharge: 2021-08-09 | Disposition: A | Discharge status: Home / Self Care | Payer: Medicare Other | Source: Ambulatory Visit | Attending: Physician Assistant | Admitting: Physician Assistant

## 2021-08-09 DIAGNOSIS — R634 Abnormal weight loss: Secondary | ICD-10-CM

## 2021-08-09 DIAGNOSIS — C189 Malignant neoplasm of colon, unspecified: Secondary | ICD-10-CM

## 2021-08-09 MED ORDER — IOPAMIDOL (ISOVUE-300) INJECTION 61%
100.0000 mL | Freq: Once | INTRAVENOUS | Status: AC | PRN
  Administered 2021-08-09: 100 mL via INTRAVENOUS

## 2021-08-13 ENCOUNTER — Other Ambulatory Visit: Payer: Self-pay | Admitting: Physician Assistant

## 2021-08-13 DIAGNOSIS — R935 Abnormal findings on diagnostic imaging of other abdominal regions, including retroperitoneum: Secondary | ICD-10-CM

## 2021-08-26 ENCOUNTER — Other Ambulatory Visit: Payer: Self-pay

## 2021-08-26 ENCOUNTER — Ambulatory Visit
Admission: RE | Admit: 2021-08-26 | Discharge: 2021-08-26 | Disposition: A | Discharge status: Home / Self Care | Payer: Medicare Other | Source: Ambulatory Visit | Attending: Physician Assistant | Admitting: Physician Assistant

## 2021-08-26 DIAGNOSIS — R935 Abnormal findings on diagnostic imaging of other abdominal regions, including retroperitoneum: Secondary | ICD-10-CM

## 2021-08-26 MED ORDER — GADOBENATE DIMEGLUMINE 529 MG/ML IV SOLN
16.0000 mL | Freq: Once | INTRAVENOUS | Status: AC | PRN
  Administered 2021-08-26: 16 mL via INTRAVENOUS

## 2021-08-28 ENCOUNTER — Other Ambulatory Visit: Payer: Self-pay | Admitting: Endocrinology

## 2021-08-29 ENCOUNTER — Other Ambulatory Visit: Payer: Self-pay | Admitting: Endocrinology

## 2021-08-31 ENCOUNTER — Telehealth: Payer: Self-pay | Admitting: Endocrinology

## 2021-08-31 DIAGNOSIS — E1165 Type 2 diabetes mellitus with hyperglycemia: Secondary | ICD-10-CM

## 2021-08-31 DIAGNOSIS — E1142 Type 2 diabetes mellitus with diabetic polyneuropathy: Secondary | ICD-10-CM

## 2021-08-31 NOTE — Telephone Encounter (Signed)
Patient advises that PCP say she needs insulin because of her readings from her [facility] [place].  Patient does not know what kind of insulin, was just insistent that her PCP said she needs "insulin". I did go over her diabetic medication, but patient was unsure of "what they are and why they are taking"  Call back # [phone]

## 2021-09-09 NOTE — Telephone Encounter (Signed)
Patient called back stated she doesn't write readings down and doesn't know how to work meter to go back and look at them. Diid state they are staying in the 200's.

## 2021-09-10 ENCOUNTER — Other Ambulatory Visit: Payer: Self-pay | Admitting: Endocrinology

## 2021-09-10 DIAGNOSIS — Z794 Long term (current) use of insulin: Secondary | ICD-10-CM

## 2021-09-10 DIAGNOSIS — E1165 Type 2 diabetes mellitus with hyperglycemia: Secondary | ICD-10-CM

## 2021-09-10 MED ORDER — BASAGLAR KWIKPEN 100 UNIT/ML ~~LOC~~ SOPN: PEN_INJECTOR | SUBCUTANEOUS | 0 refills | Status: DC

## 2021-09-10 NOTE — Telephone Encounter (Signed)
Basaglar is not covered but Lantus is. Please advise

## 2021-09-10 NOTE — Telephone Encounter (Signed)
Prescription sent. Called patient to give instructions on how to take it.

## 2021-09-10 NOTE — Addendum Note (Signed)
Addended by: Cinda Quest on: 09/10/2021 10:20 AM   Modules accepted: Orders

## 2021-09-20 ENCOUNTER — Other Ambulatory Visit: Payer: Self-pay

## 2021-09-20 ENCOUNTER — Other Ambulatory Visit (INDEPENDENT_AMBULATORY_CARE_PROVIDER_SITE_OTHER): Payer: Medicare Other

## 2021-09-20 ENCOUNTER — Encounter: Payer: Self-pay | Admitting: Endocrinology

## 2021-09-20 ENCOUNTER — Ambulatory Visit (INDEPENDENT_AMBULATORY_CARE_PROVIDER_SITE_OTHER): Payer: Medicare Other | Admitting: Endocrinology

## 2021-09-20 VITALS — BP 138/70 | HR 87 | Ht 65.0 in | Wt 186.8 lb

## 2021-09-20 DIAGNOSIS — Z23 Encounter for immunization: Secondary | ICD-10-CM

## 2021-09-20 DIAGNOSIS — Z794 Long term (current) use of insulin: Secondary | ICD-10-CM

## 2021-09-20 DIAGNOSIS — E1142 Type 2 diabetes mellitus with diabetic polyneuropathy: Secondary | ICD-10-CM

## 2021-09-20 DIAGNOSIS — E1165 Type 2 diabetes mellitus with hyperglycemia: Secondary | ICD-10-CM

## 2021-09-20 DIAGNOSIS — Z89511 Acquired absence of right leg below knee: Secondary | ICD-10-CM

## 2021-09-20 DIAGNOSIS — E039 Hypothyroidism, unspecified: Secondary | ICD-10-CM | POA: Diagnosis not present

## 2021-09-20 LAB — LIPID PANEL
Cholesterol: 145 mg/dL (ref 0–200)
HDL: 39.1 mg/dL (ref >39.00)
NonHDL: 106
Total CHOL/HDL Ratio: 4
Triglycerides: 376 mg/dL — ABNORMAL HIGH (ref 0.0–149.0)
VLDL: 75.2 mg/dL — ABNORMAL HIGH (ref 0.0–40.0)

## 2021-09-20 LAB — COMPREHENSIVE METABOLIC PANEL
ALT: 11 U/L (ref 0–35)
AST: 12 U/L (ref 0–37)
Albumin: 3.7 g/dL (ref 3.5–5.2)
Alkaline Phosphatase: 61 U/L (ref 39–117)
BUN: 8 mg/dL (ref 6–23)
CO2: 25 mEq/L (ref 19–32)
Calcium: 8.9 mg/dL (ref 8.4–10.5)
Chloride: 104 mEq/L (ref 96–112)
Creatinine, Ser: 0.7 mg/dL (ref 0.40–1.20)
GFR: 96.74 mL/min (ref >60.00)
Glucose, Bld: 189 mg/dL — ABNORMAL HIGH (ref 70–99)
Potassium: 3.4 mEq/L — ABNORMAL LOW (ref 3.5–5.1)
Sodium: 140 mEq/L (ref 135–145)
Total Bilirubin: 0.3 mg/dL (ref 0.2–1.2)
Total Protein: 6.3 g/dL (ref 6.0–8.3)

## 2021-09-20 LAB — URINALYSIS, ROUTINE W REFLEX MICROSCOPIC
Bilirubin Urine: NEGATIVE
Ketones, ur: NEGATIVE
Leukocytes,Ua: NEGATIVE
Nitrite: NEGATIVE
Specific Gravity, Urine: <=1.005 — AB (ref 1.000–1.030)
Total Protein, Urine: NEGATIVE
Urine Glucose: >=1000 — AB
Urobilinogen, UA: 0.2 (ref 0.0–1.0)
pH: 6.5 (ref 5.0–8.0)

## 2021-09-20 LAB — T4, FREE: Free T4: 0.61 ng/dL (ref 0.60–1.60)

## 2021-09-20 LAB — MICROALBUMIN / CREATININE URINE RATIO
Creatinine,U: 26.1 mg/dL
Microalb Creat Ratio: 18.4 mg/g (ref 0.0–30.0)
Microalb, Ur: 4.8 mg/dL — ABNORMAL HIGH (ref 0.0–1.9)

## 2021-09-20 LAB — POCT GLYCOSYLATED HEMOGLOBIN (HGB A1C): Hemoglobin A1C: 7.4 % — AB (ref 4.0–5.6)

## 2021-09-20 LAB — TSH: TSH: 1.34 u[IU]/mL (ref 0.35–5.50)

## 2021-09-20 LAB — LDL CHOLESTEROL, DIRECT: Direct LDL: 66 mg/dL

## 2021-09-20 MED ORDER — FUROSEMIDE 20 MG PO TABS: 20.0000 mg | ORAL_TABLET | Freq: Every day | ORAL | 1 refills | Status: DC

## 2021-09-20 NOTE — Progress Notes (Signed)
Patient ID: [name], female   DOB: 04-15-1966, 55 y.o.   MRN: [id number]   Reason for Appointment: Diabetes follow-up    History of Present Illness:   Diagnosis: Type 2 diabetes mellitus, date of diagnosis: 2004.  PAST history: She has been treated mostly with insulin since about a year after diagnosis. She has had difficulty with consistent compliance with diet and also compliance with self care including glucose monitoring over the years. She had been mostly treated with basal insulin. Also had been tried on mealtime insulin but she would be noncompliant with this. Was tried on Prandin for mealtime control but difficult to judge efficacy because of lack of postprandial monitoring. Was given Victoza to start in 2011 but did not follow up after this.She was tried on premixed insulin but this did not help her control, mostly because of noncompliance with the doses. She had been using the V- go pump and had better control initially and was better compliant with the daily routine and boluses. She has had frequent education visits also. She stopped using her V.-go pump in 6/15 because of discomfort at the site of application, mostly was using this on her abdomen Also did not improve with a trial of Victoza   RECENT history:    INSULIN dose: Glargine insulin 6-10 units in am.  Non-insulin hypoglycemic drugs: Metformin ER 750 mg, 2 tablets daily, Jardiance 25 mg daily at 8 pm, Victoza 1.2 mg daily  Her A1c is 7.4 compared to 5.9   Current management, problems identified:  She did not come back for follow-up after her December 2021 visit  At that time she was having low normal sugars and was told to taper off her insulin and stay on Victoza 1.8 mg   Not clear if sugar was controlled subsequently but she thinks they have been higher for the last 6 weeks or so  She also has been losing weight and has lost 10 pounds  As before she continues to consume some regular soft drinks  about once or twice a day  Blood sugars been mostly higher POSTPRANDIALLY and averaging as high as 265 at bed time in the last 2 weeks CGM has been active only 63% of the time  Also she is generally eating only 1 meal a day and sometimes may have more carbohydrate She was told to continue her Victoza which she misunderstood and stopped this when she started her insulin about 10 days ago Since then her postprandial readings are higher Lowest blood sugar as before usually early in the morning and highest around bedtime  Interpretation of her CGM is as follows  Blood sugars are highly variable during the daytime although less variable early morning Generally blood sugars start rising around 9 AM and are progressively going up until about midnight POSTPRANDIAL readings are mostly higher after her main meal around 4 PM but also has some significant hyperglycemia at times midday and late evening.  Generally postprandial readings are quite variable from day-to-day Overnight blood sugars are higher at about midnight and then usually lower by early morning with some variability; blood sugars were low normal or slightly low at every couple of nights Hypoglycemia only noted a couple of times early morning transiently  CGM use % of time 63  2-week average/GV 198/36  Time in range 49      %  % Time Above 180 24  % Time above 250 26  % Time Below 70 1  PRE-MEAL Fasting Lunch Dinner Bedtime Overall  Glucose range:       Averages: 135  222 165 198   POST-MEAL PC Breakfast PC Lunch PC Dinner  Glucose range:     Averages:   241    Prior  CGM use % of time  35  2-week average/GV  110/38  Time in range        %  % Time Above 180  8  % Time above 250  75  % Time Below 70  17     PRE-MEAL Fasting  12-2 PM Dinner Bedtime Overall  Glucose range:       Averages:  78  105  136  168  110   POST-MEAL PC Breakfast PC Lunch PC Dinner  Glucose range:     Averages:    146     Food  preferences: eating Mostly 1 meal per day around 6-8 pm, variable intake, sandwiches at times or otherwise snacks,  Physical activity: exercise: Minimal.  Certified Diabetes Educator visit: Most recent:, 2/14.  Nutrition counseling: 10/20/2020   Wt Readings from Last 3 Encounters:  09/20/21 186 lb 12.8 oz (84.7 kg)  01/29/21 196 lb (88.9 kg)  12/24/20 194 lb 6.4 oz (88.2 kg)   DM labs:   Lab Results  Component Value Date   HGBA1C 7.4 (A) 09/20/2021   HGBA1C 5.9 12/22/2020   HGBA1C 6.2 (A) 10/20/2020   Lab Results  Component Value Date   MICROALBUR <0.7 10/20/2020   LDLCALC 51 12/22/2020   CREATININE 0.82 07/23/2021     Other active problems: See review of systems    Allergies as of 09/20/2021       Reactions   Latex Itching, Rash, Other (See Comments)   Pt states she cannot use condoms - cause an infection.  Use of latex on skin is okay for short period of time.  Tape causes rash   Sweetening Enhancer [flavoring Agent] Nausea And Vomiting, Other (See Comments)   HEADACHES   Ropinirole    Pt stated, "I am getting cramps in my legs - especially at night"   Aspartame And Phenylalanine Nausea And Vomiting   HEADACHES   Ibuprofen Other (See Comments)   HEADACHES   Trazodone And Nefazodone Other (See Comments)   Hallucinations        Medication List        Accurate as of September 20, 2021  2:16 PM. If you have any questions, ask your nurse or doctor.          aspirin-acetaminophen-caffeine 250-250-65 MG tablet Commonly known as: EXCEDRIN MIGRAINE Take 2 tablets by mouth every 6 (six) hours as needed for headache.   atorvastatin 40 MG tablet Commonly known as: LIPITOR Take 40 mg by mouth at bedtime.   BD Pen Needle Nano 2nd Gen 32G X 4 MM Misc Generic drug: Insulin Pen Needle 1 each by Does not apply route daily. Use to inject insulin daily.   bisoprolol 5 MG tablet Commonly known as: ZEBETA Take 1 tablet (5 mg total) by mouth daily.   diazepam 5  MG tablet Commonly known as: VALIUM Take 5 mg by mouth 2 (two) times daily.   DULoxetine 30 MG capsule Commonly known as: CYMBALTA Take 30 mg by mouth 2 (two) times daily.   esomeprazole 40 MG capsule Commonly known as: NEXIUM Take 40 mg by mouth daily.   fenofibrate 48 MG tablet Commonly known as: TRICOR Take 48 mg by mouth daily.  FreeStyle Libre 14 Day Sensor Misc 2 each by Does not apply route every 14 (fourteen) days. Use 1 sensor every 14 days to monitor blood sugar.   gabapentin 600 MG tablet Commonly known as: NEURONTIN Take 600 mg by mouth 3 (three) times daily.   glucose blood test strip Commonly known as: FREESTYLE TEST STRIPS USE TO CHECK BLOOD SUGARS DAILY. DX CODE E11.65   Ingrezza 80 MG capsule Generic drug: valbenazine Take 80 mg by mouth at bedtime.   insulin glargine 100 UNIT/ML Solostar Pen Commonly known as: LANTUS Inject 22 Units into the skin daily. INJECT 0.22 MLS (22 UNITS TOTAL) INTO THE SKIN DAILY   Lantus SoloStar 100 UNIT/ML Solostar Pen Generic drug: insulin glargine Injuect 10 units daily, if blood sugars are 150-200 only inject 6 units   Jardiance 25 MG Tabs tablet Generic drug: empagliflozin TAKE 1 TABLET BY MOUTH EVERY DAY   LAXATIVE POLYETHYLENE GLYCOL PO 17g (1 capful) in a drink   levothyroxine 25 MCG tablet Commonly known as: SYNTHROID TAKE 1 TABLET BY MOUTH EVERY DAY BEFORE BREAKFAST   Linzess 72 MCG capsule Generic drug: linaclotide as needed.   losartan 50 MG tablet Commonly known as: COZAAR TAKE 1 TABLET BY MOUTH EVERY DAY   metFORMIN 750 MG 24 hr tablet Commonly known as: GLUCOPHAGE-XR TAKE 1 TABLET (750 MG TOTAL) BY MOUTH 2 (TWO) TIMES DAILY.   oxyCODONE-acetaminophen 10-325 MG tablet Commonly known as: PERCOCET   pantoprazole 40 MG tablet Commonly known as: PROTONIX   QUEtiapine 300 MG tablet Commonly known as: SEROQUEL Take 600 mg by mouth at bedtime. One hour before bed   spironolactone 25 MG  tablet Commonly known as: ALDACTONE TAKE 1 TABLET (25 MG TOTAL) BY MOUTH DAILY.   sucralfate 1 g tablet Commonly known as: Carafate Take 1 tablet (1 g total) by mouth 2 (two) times daily.   tiZANidine 4 MG tablet Commonly known as: ZANAFLEX Take 4 mg by mouth every 6 (six) hours as needed for muscle spasms.   Victoza 18 MG/3ML Sopn Generic drug: liraglutide INJECT 1.8 MG UNDER THE SKIN ONCE DAILY   Vraylar 6 MG Caps Generic drug: Cariprazine HCl Take 6 mg by mouth at bedtime.        Allergies:  Allergies  Allergen Reactions   Latex Itching, Rash and Other (See Comments)    Pt states she cannot use condoms - cause an infection.  Use of latex on skin is okay for short period of time.  Tape causes rash   Sweetening Enhancer [Flavoring Agent] Nausea And Vomiting and Other (See Comments)    HEADACHES   Ropinirole     Pt stated, "I am getting cramps in my legs - especially at night"   Aspartame And Phenylalanine Nausea And Vomiting    HEADACHES   Ibuprofen Other (See Comments)    HEADACHES   Trazodone And Nefazodone Other (See Comments)    Hallucinations     Past Medical History:  Diagnosis Date   Anxiety    Arthritis    Bipolar disorder ([place])    Broken jaw (HCC)    Broken wrist    Chronic back pain    Claudication ([place])    a. 12/2013 ABI's: R 0.97, L 0.94.   Colon cancer (HCC)    COPD (chronic obstructive pulmonary disease) (HCC)    Depression    Diabetic Charcot's foot (HCC)    Diabetic foot ulcer ([place])    chronic left foot ulcer , great toe/  hx  recurrent foot ulcer bilaterally   DJD (degenerative joint disease)    Edema of both lower extremities    Episode of memory loss    Family history of bladder cancer    Family history of colon cancer    Family history of kidney cancer    GERD (gastroesophageal reflux disease)    Headache(784.0)    Hiatal hernia    History of colon cancer, stage I dx 02-26-2015--- oncologist-- dr [name]--- per last note no recurrence    05-16-2015 s/p  Laparoscopic sigmoid colectomy w/ node bx's (negative nodes per path)  Stage I (pT1,N0,M0) Grade 2   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   per MRI multiple small strokes post cardiac cath. --  residual mild memory loss   History of methicillin resistant staphylococcus aureus (MRSA)    Hypercholesterolemia    Hypertension    Insomnia    Insulin dependent type 2 diabetes mellitus, uncontrolled ([place]) dx 2004   endocrinologist-  dr Dwyane Dee-- last A1c 8.8 in Aug2018:  pt is noncompliant w/ diet, states does note eat breakfast , her first main meal in afternoon   Neurogenic bladder    Neuropathy, diabetic ([place])    hands and feet   OSA (obstructive sleep apnea)    cpap intolerant   Personality disorder (HCC)    Restless leg syndrome    SOB (shortness of breath) on exertion    Stroke [facility])    SUI (stress urinary incontinence, female) S/P SLING 12-29-2011    Past Surgical History:  Procedure Laterality Date   AMPUTATION Right 04/13/2018   Procedure: RIGHT BELOW KNEE AMPUTATION;  Surgeon: Newt Minion, MD;  Location: [place];  Service: Orthopedics;  Laterality: Right;   AMPUTATION Left 10/26/2018   Procedure: LEFT FOOT FIRST RAY AMPUTATION;  Surgeon: Newt Minion, MD;  Location: [place];  Service: Orthopedics;  Laterality: Left;   AMPUTATION TOE Left 05/10/2019   Procedure: AMPUTATION 2nd left TOE WITH METATARSAL;  Surgeon: Landis Martins, DPM;  Location: [place];  Service: Podiatry;  Laterality: Left;   ANTERIOR CERVICAL DECOMP/DISCECTOMY FUSION  4034   C5 - 7   APPLICATION OF WOUND VAC Left 10/26/2018   Procedure: APPLICATION OF WOUND VAC;  Surgeon: Newt Minion, MD;  Location: [place];  Service: Orthopedics;  Laterality: Left;   BACK SURGERY     CARDIAC CATHETERIZATION  05-22-2008   DR SKAINS   NO SIGNIFECANT CAD/ NORMAL LV/  EF 65%/  NO WALL MOTION ABNORMALITIES   CARPAL TUNNEL RELEASE Right 04-25-2013   CATARACT EXTRACTION Right 10/24/2018    CESAREAN SECTION  1989; 1992   COLON SURGERY     COLONOSCOPY     CYSTO N/A 04/30/2013   Procedure: CYSTOSCOPY;  Surgeon: Reece Packer, MD;  Location: [facility];  Service: Urology;  Laterality: N/A;   CYSTOSCOPY MACROPLASTIQUE IMPLANT N/A 02/06/2018   Procedure: CYSTOSCOPY MACROPLASTIQUE IMPLANT;  Surgeon: Bjorn Loser, MD;  Location: [facility];  Service: Urology;  Laterality: N/A;   CYSTOSCOPY WITH INJECTION  05/04/2012   Procedure: CYSTOSCOPY WITH INJECTION;  Surgeon: Reece Packer, MD;  Location: [place];  Service: Urology;  Laterality: N/A;  MACROPLASTIQUE INJECTION   CYSTOSCOPY WITH INJECTION  08/28/2012   Procedure: CYSTOSCOPY WITH INJECTION;  Surgeon: Reece Packer, MD;  Location: [place];  Service: Urology;  Laterality: N/A;  cysto and macroplastique    FOOT SURGERY Bilateral    "related  to [place]"   [place]   "stomach"   I & D EXTREMITY Left 10/25/2018   Procedure: IRRIGATION AND DEBRIDEMENT GREAT TOE;  Surgeon: Meredith Pel, MD;  Location: [place];  Service: Orthopedics;  Laterality: Left;   INCISION AND DRAINAGE Left 10/25/2018   GREAT TOE   INCISION AND DRAINAGE OF WOUND Right 04/11/2018   Procedure: IRRIGATION AND DEBRIDEMENT WOUND right foot and right ankle;  Surgeon: Evelina Bucy, DPM;  Location: [facility];  Service: Podiatry;  Laterality: Right;   KNEE ARTHROSCOPY Left    KNEE ARTHROSCOPY W/ ALLOGRAFT IMPANT Left    graft x 2   KNEE SURGERY     TOTAL 8 SURG'S   LAPAROSCOPIC SIGMOID COLECTOMY N/A 04/22/2015   Procedure: LAPAROSCOPIC HAND ASSISTED SIGMOID COLECTOMY;  Surgeon: Erroll Luna, MD;  Location: [place];  Service: General;  Laterality: N/A;   LEFT HEART CATHETERIZATION WITH CORONARY ANGIOGRAM N/A 02/12/2014   Procedure: LEFT HEART CATHETERIZATION WITH CORONARY ANGIOGRAM;  Surgeon: Candee Furbish, MD;  Location: [facility] C[facility];  Service: Cardiovascular;  Laterality: N/A;   No  angiographically significant CAD; normal LVSF, LVEDP 5mmHg,  EF 55% (new finding ef 30% myoview 01-08-2014)   LUMBAR FUSION     MANDIBLE FRACTURE SURGERY     MULTIPLE LAPAROSCOPIES FOR ENDOMETRIOSIS     PUBOVAGINAL SLING  12/29/2011   Procedure: Gaynelle Arabian;  Surgeon: Reece Packer, MD;  Location: [facility];  Service: Urology;  Laterality: N/A;  cysto and sparc sling    PUBOVAGINAL SLING N/A 04/30/2013   Procedure: REMOVAL OF VAGINAL MESH;  Surgeon: Reece Packer, MD;  Location[facility];  Service: Urology;  Laterality: N/A;   RECONSTURCTION OF CONGENITAL UTERUS ANOMALY  1983   REPEAT RECONSTRUCTION ACL LEFT KNEE/ SCREWS REMOVED  03-28-2000   CADAVER GRAFT   TOTAL ABDOMINAL HYSTERECTOMY  1997   w/BSO   TRANSTHORACIC ECHOCARDIOGRAM  02/13/2014   ef 45%, hypokinesis base inferior and base inferolateral walls   UPPER GI ENDOSCOPY     WOUND DEBRIDEMENT Right 09/05/2016   Procedure: DEBRIDEMENT WOUND WITH GRAFT RIGHT FOOT;  Surgeon: Landis Martins, DPM;  Location: [place];  Service: Podiatry;  Laterality: Right;   WRIST FRACTURE SURGERY      Family History  Problem Relation Age of Onset   Hypertension Mother    Diabetes Mother    Cancer - Other Mother        lymphoma    Cancer - Other Father        lung, bladder cancer    Heart attack Father    Other Father        Lynch syndrome   Cancer - Other Brother        bladder cancer    Bladder Cancer Paternal Aunt        Lynch syndrome   Other Paternal Uncle        Lynch syndrome   Other Cousin        Lynch syndrome    Social History:  reports that she has quit smoking. Her smoking use included cigarettes. She has a 92.00 pack-year smoking history. She has never used smokeless tobacco. She reports that she does not currently use alcohol. She reports that she does not currently use drugs.  Review of Systems -   HYPOTHYROIDISM:  She had routine lab work done by her PCP and her TSH was 8.5 She was started on  levothyroxine 25 mcg daily  not clear  if she is taking this now  Lab Results  Component Value Date   TSH 1.30 12/22/2020   TSH 1.02 10/20/2020   TSH 8.51 (A) 06/24/2020   FREET4 0.78 12/22/2020   FREET4 0.64 10/20/2020   FREET4 0.65 03/19/2018    She has history of high triglycerides treated with fenofibrate    Lab Results  Component Value Date   CHOL 130 12/22/2020   HDL 43.70 12/22/2020   LDLCALC 51 12/22/2020   LDLDIRECT 57.0 08/22/2019   TRIG 180.0 (H) 12/22/2020   CHOLHDL 3 12/22/2020     Painful neuropathy: She has had persistent symptoms including leg pains tingling and numbness as well as difficulty with balance  Has been on gabapentin but does not get enough relief with this   Did not want Lyrica because of potential for weight gain   Foot exams: Last showed absent pedal pulses and Absent distal sensation  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 Treated with 50 mg losartan and bisoprolol 5 mg  she also has had edema  Also on Jardiance  BP Readings from Last 3 Encounters:  09/20/21 138/70  07/23/21 (!) 170/85  01/29/21 124/76        Examination:   BP 138/70   Pulse 87   Ht 5\' 5"  (1.651 m)   Wt 186 lb 12.8 oz (84.7 kg)   SpO2 98%   BMI 31.09 kg/m   Body mass index is 31.09 kg/m.   Diabetic Foot Exam - Simple   Simple Foot Form Diabetic Foot exam was performed with the following findings: Yes   Visual Inspection No deformities, no ulcerations, no other skin breakdown bilaterally: Yes See comments: Yes Sensation Testing See comments: Yes Pulse Check See comments: Yes Comments Right below-knee amputation present Partial amputation of left foot present Monofilament sensation absent on the left foot Pedal pulses not present on the left foot No skin breakdown or  ulcers    2+ left lower leg edema present   Assesment/Plan:   DIABETES  type 2, with mild obesity, insulin requiring  See history of present illness for detailed discussion of current diabetes management, blood sugar patterns and problems identified  Her A1c is back up to 7.4  However her freestyle libre indicates a GMI of 8%   Although previously she was able to taper off her insulin and benefiting from Victoza her blood sugars are higher again indicating insulin deficiency She does appear to have however marked hyperglycemia after meals at times frequently going up over 250 Overnight blood sugars have also been high In the last 10 days she has not taken her Victoza and she did not understand needing to take this with her insulin Also unclear if she is taking a consistent dose of insulin also As before some of her high readings are from drinking regular [facility]  Has lost weight possibly from hypoglycemia  Recommendations: Needs to periodically be checking her blood sugar with fingerstick to compare with the Elenor Legato  She will take a stable dose of 6 units of insulin for at least a week, continue Lantus  Restart Victoza 1.2 mg daily and after 1 week up to 1.8  Stay on Jardiance and metformin  Cut back on high carbohydrate foods and snacks Avoid drinks with sugar Discussed possibly adding NovoLog if she continues to have significant  high postprandial readings   Mild hypothyroidism: Recheck thyroid levels today and will need to make sure she is taking her levothyroxine  Neuropathy with sensory loss: Has normal new skin breakdown on the left foot  HYPERTENSION: Controlled  To check labs today and also lipid panel  Dependent leg edema: We will have her take Lasix 20 mg daily for a week and then as needed     There are no Patient Instructions on file for this visit.         Elayne Snare 09/20/2021, 2:16 PM

## 2021-09-20 NOTE — Patient Instructions (Addendum)
Lantus in am, take 6 Units daily and if am sugar stays over 150 after 1 week then go up 8  Victoza 1.2 mg daily 2nd # for 1 week then go to 1.8mg  (3rd dose)

## 2021-09-28 ENCOUNTER — Other Ambulatory Visit: Payer: Self-pay | Admitting: Endocrinology

## 2021-10-14 ENCOUNTER — Other Ambulatory Visit: Payer: Self-pay | Admitting: Endocrinology

## 2021-10-23 ENCOUNTER — Other Ambulatory Visit: Payer: Self-pay | Admitting: Endocrinology

## 2021-11-04 ENCOUNTER — Ambulatory Visit (INDEPENDENT_AMBULATORY_CARE_PROVIDER_SITE_OTHER): Payer: Medicare Other | Admitting: Endocrinology

## 2021-11-04 ENCOUNTER — Other Ambulatory Visit: Payer: Self-pay

## 2021-11-04 ENCOUNTER — Encounter: Payer: Self-pay | Admitting: Endocrinology

## 2021-11-04 VITALS — BP 120/76 | HR 106 | Ht 65.0 in | Wt 188.6 lb

## 2021-11-04 DIAGNOSIS — I10 Essential (primary) hypertension: Secondary | ICD-10-CM | POA: Diagnosis not present

## 2021-11-04 DIAGNOSIS — Z89511 Acquired absence of right leg below knee: Secondary | ICD-10-CM

## 2021-11-04 DIAGNOSIS — E876 Hypokalemia: Secondary | ICD-10-CM | POA: Diagnosis not present

## 2021-11-04 DIAGNOSIS — E1165 Type 2 diabetes mellitus with hyperglycemia: Secondary | ICD-10-CM

## 2021-11-04 DIAGNOSIS — Z794 Long term (current) use of insulin: Secondary | ICD-10-CM

## 2021-11-04 LAB — BASIC METABOLIC PANEL
BUN: 19 mg/dL (ref 6–23)
CO2: 27 mEq/L (ref 19–32)
Calcium: 9.9 mg/dL (ref 8.4–10.5)
Chloride: 98 mEq/L (ref 96–112)
Creatinine, Ser: 0.95 mg/dL (ref 0.40–1.20)
GFR: 67 mL/min (ref >60.00)
Glucose, Bld: 112 mg/dL — ABNORMAL HIGH (ref 70–99)
Potassium: 4.7 mEq/L (ref 3.5–5.1)
Sodium: 136 mEq/L (ref 135–145)

## 2021-11-04 NOTE — Progress Notes (Signed)
Patient ID: [name], female   DOB: 04/16/1966, 55 y.o.   MRN: [id number]   Reason for Appointment: Diabetes follow-up    History of Present Illness:   Diagnosis: Type 2 diabetes mellitus, date of diagnosis: 2004.  PAST history: She has been treated mostly with insulin since about a year after diagnosis. She has had difficulty with consistent compliance with diet and also compliance with self care including glucose monitoring over the years. She had been mostly treated with basal insulin. Also had been tried on mealtime insulin but she would be noncompliant with this. Was tried on Prandin for mealtime control but difficult to judge efficacy because of lack of postprandial monitoring. Was given Victoza to start in 2011 but did not follow up after this.She was tried on premixed insulin but this did not help her control, mostly because of noncompliance with the doses. She had been using the V- go pump and had better control initially and was better compliant with the daily routine and boluses. She has had frequent education visits also. She stopped using her V.-go pump in 6/15 because of discomfort at the site of application, mostly was using this on her abdomen Also did not improve with a trial of Victoza   RECENT history:    INSULIN dose: Glargine insulin 10 units in am.  Non-insulin hypoglycemic drugs: Metformin ER 750 mg, 2 tablets daily, Jardiance 25 mg daily at 8 pm, Victoza 1.8 mg daily  Her A1c is most recently 7.4 compared to 5.9   Current management, problems identified:  She did not bring her freestyle reader for evaluation of her blood sugars  On her last visit because of rising A1c she was told to restart her [place] and go up to 1.8 mg She think this is tolerated well and no nausea Back for follow-up after her December 2021 visit  Again she has difficulty remembering what her blood sugar readings are Occasionally she thinks they may be over 200 after  meals However lowest blood sugar she remembers is about 89 in the morning No hypoglycemia As before she continues to consume some regular soft drinks at times causing higher sugars Blood sugars previously were as high as 265 Her weight is about the same  Interpretation of her 2-week CGM previously  Blood sugars are highly variable during the daytime although less variable early morning Generally blood sugars start rising around 9 AM and are progressively going up until about midnight POSTPRANDIAL readings are mostly higher after her main meal around 4 PM but also has some significant hyperglycemia at times midday and late evening.  Generally postprandial readings are quite variable from day-to-day Overnight blood sugars are higher at about midnight and then usually lower by early morning with some variability; blood sugars were low normal or slightly low at every couple of nights   CGM use % of time 63  2-week average/GV 198/36  Time in range 49      %  % Time Above 180 24  % Time above 250 26  % Time Below 70 1     PRE-MEAL Fasting Lunch Dinner Bedtime Overall  Glucose range:       Averages: 135  222 165 198   POST-MEAL PC Breakfast PC Lunch PC Dinner  Glucose range:     Averages:   241      Food preferences: eating Mostly 1 meal per day around 6-8 pm, variable intake, sandwiches at times or otherwise snacks,  Physical  activity: exercise: Minimal.  Certified Diabetes Educator visit: Most recent:, 2/14.  Nutrition counseling: 10/20/2020   Wt Readings from Last 3 Encounters:  11/04/21 188 lb 9.6 oz (85.5 kg)  09/20/21 186 lb 12.8 oz (84.7 kg)  01/29/21 196 lb (88.9 kg)   DM labs:   Lab Results  Component Value Date   HGBA1C 7.4 (A) 09/20/2021   HGBA1C 5.9 12/22/2020   HGBA1C 6.2 (A) 10/20/2020   Lab Results  Component Value Date   MICROALBUR 4.8 (H) 09/20/2021   LDLCALC 51 12/22/2020   CREATININE 0.70 09/20/2021     Other active problems: See review of  systems    Allergies as of 11/04/2021       Reactions   Latex Itching, Rash, Other (See Comments)   Pt states she cannot use condoms - cause an infection.  Use of latex on skin is okay for short period of time.  Tape causes rash   Sweetening Enhancer [flavoring Agent] Nausea And Vomiting, Other (See Comments)   HEADACHES   Ropinirole    Pt stated, "I am getting cramps in my legs - especially at night"   Aspartame And Phenylalanine Nausea And Vomiting   HEADACHES   Ibuprofen Other (See Comments)   HEADACHES   Trazodone And Nefazodone Other (See Comments)   Hallucinations        Medication List        Accurate as of November 04, 2021  2:03 PM. If you have any questions, ask your nurse or doctor.          aspirin-acetaminophen-caffeine 250-250-65 MG tablet Commonly known as: EXCEDRIN MIGRAINE Take 2 tablets by mouth every 6 (six) hours as needed for headache.   atorvastatin 40 MG tablet Commonly known as: LIPITOR Take 40 mg by mouth at bedtime.   BD Pen Needle Nano 2nd Gen 32G X 4 MM Misc Generic drug: Insulin Pen Needle 1 each by Does not apply route daily. Use to inject insulin daily.   bisoprolol 5 MG tablet Commonly known as: ZEBETA Take 1 tablet (5 mg total) by mouth daily.   diazepam 5 MG tablet Commonly known as: VALIUM Take 5 mg by mouth 2 (two) times daily.   DULoxetine 30 MG capsule Commonly known as: CYMBALTA Take 30 mg by mouth 2 (two) times daily.   esomeprazole 40 MG capsule Commonly known as: NEXIUM Take 40 mg by mouth daily.   fenofibrate 48 MG tablet Commonly known as: TRICOR Take 48 mg by mouth daily.   FreeStyle Libre 14 Day Sensor Misc 2 each by Does not apply route every 14 (fourteen) days. Use 1 sensor every 14 days to monitor blood sugar.   furosemide 20 MG tablet Commonly known as: LASIX TAKE 1 TABLET (20 MG TOTAL) BY MOUTH DAILY. FOR LEG SWELLING   gabapentin 600 MG tablet Commonly known as: NEURONTIN Take 600 mg by mouth  3 (three) times daily.   glucose blood test strip Commonly known as: FREESTYLE TEST STRIPS USE TO CHECK BLOOD SUGARS DAILY. DX CODE E11.65   Ingrezza 80 MG capsule Generic drug: valbenazine Take 80 mg by mouth at bedtime.   insulin glargine 100 UNIT/ML Solostar Pen Commonly known as: LANTUS Inject 22 Units into the skin daily. INJECT 0.22 MLS (22 UNITS TOTAL) INTO THE SKIN DAILY   Lantus SoloStar 100 UNIT/ML Solostar Pen Generic drug: insulin glargine Injuect 10 units daily, if blood sugars are 150-200 only inject 6 units   Jardiance 25 MG Tabs tablet Generic drug:  empagliflozin TAKE 1 TABLET BY MOUTH EVERY DAY   LAXATIVE POLYETHYLENE GLYCOL PO 17g (1 capful) in a drink   levothyroxine 25 MCG tablet Commonly known as: SYNTHROID TAKE 1 TABLET BY MOUTH EVERY DAY BEFORE BREAKFAST   Linzess 72 MCG capsule Generic drug: linaclotide as needed.   losartan 50 MG tablet Commonly known as: COZAAR TAKE 1 TABLET BY MOUTH EVERY DAY   metFORMIN 750 MG 24 hr tablet Commonly known as: GLUCOPHAGE-XR TAKE 1 TABLET (750 MG TOTAL) BY MOUTH 2 (TWO) TIMES DAILY.   oxyCODONE-acetaminophen 10-325 MG tablet Commonly known as: PERCOCET   pantoprazole 40 MG tablet Commonly known as: PROTONIX   QUEtiapine 300 MG tablet Commonly known as: SEROQUEL Take 600 mg by mouth at bedtime. One hour before bed   spironolactone 25 MG tablet Commonly known as: ALDACTONE TAKE 1 TABLET (25 MG TOTAL) BY MOUTH DAILY.   Victoza 18 MG/3ML Sopn Generic drug: liraglutide INJECT 1.8 MG UNDER THE SKIN ONCE DAILY   Vraylar 6 MG Caps Generic drug: Cariprazine HCl Take 6 mg by mouth at bedtime.        Allergies:  Allergies  Allergen Reactions   Latex Itching, Rash and Other (See Comments)    Pt states she cannot use condoms - cause an infection.  Use of latex on skin is okay for short period of time.  Tape causes rash   Sweetening Enhancer [Flavoring Agent] Nausea And Vomiting and Other (See  Comments)    HEADACHES   Ropinirole     Pt stated, "I am getting cramps in my legs - especially at night"   Aspartame And Phenylalanine Nausea And Vomiting    HEADACHES   Ibuprofen Other (See Comments)    HEADACHES   Trazodone And Nefazodone Other (See Comments)    Hallucinations     Past Medical History:  Diagnosis Date   Anxiety    Arthritis    Bipolar disorder ([place])    Broken jaw (HCC)    Broken wrist    Chronic back pain    Claudication ([place])    a. 12/2013 ABI's: R 0.97, L 0.94.   Colon cancer ([place])    COPD (chronic obstructive pulmonary disease) (HCC)    Depression    Diabetic Charcot's foot (HCC)    Diabetic foot ulcer ([place])    chronic left foot ulcer , great toe/  hx recurrent foot ulcer bilaterally   DJD (degenerative joint disease)    Edema of both lower extremities    Episode of memory loss    Family history of bladder cancer    Family history of colon cancer    Family history of kidney cancer    GERD (gastroesophageal reflux disease)    Headache(784.0)    Hiatal hernia    History of colon cancer, stage I dx 02-26-2015--- oncologist-- dr [name]--- per last note no recurrence   05-16-2015 s/p  Laparoscopic sigmoid colectomy w/ node bx's (negative nodes per path)  Stage I (pT1,N0,M0) Grade 2   History of CVA with residual deficit 02-12-2014  post op cardiac cath.   per MRI multiple small strokes post cardiac cath. --  residual mild memory loss   History of methicillin resistant staphylococcus aureus (MRSA)    Hypercholesterolemia    Hypertension    Insomnia    Insulin dependent type 2 diabetes mellitus, uncontrolled dx 2004   endocrinologist-  dr Dwyane Dee-- last A1c 8.8 in Aug2018:  pt is noncompliant w/ diet, states does note eat breakfast , her  first main meal in afternoon   Neurogenic bladder    Neuropathy, diabetic ([place])    hands and feet   OSA (obstructive sleep apnea)    cpap intolerant   Personality disorder (HCC)    Restless leg syndrome    SOB  (shortness of breath) on exertion    Stroke [facility])    SUI (stress urinary incontinence, female) S/P SLING 12-29-2011    Past Surgical History:  Procedure Laterality Date   AMPUTATION Right 04/13/2018   Procedure: RIGHT BELOW KNEE AMPUTATION;  Surgeon: Newt Minion, MD;  Location: [place];  Service: Orthopedics;  Laterality: Right;   AMPUTATION Left 10/26/2018   Procedure: LEFT FOOT FIRST RAY AMPUTATION;  Surgeon: Newt Minion, MD;  Location: [place];  Service: Orthopedics;  Laterality: Left;   AMPUTATION TOE Left 05/10/2019   Procedure: AMPUTATION 2nd left TOE WITH METATARSAL;  Surgeon: Landis Martins, DPM;  Location: [place];  Service: Podiatry;  Laterality: Left;   ANTERIOR CERVICAL DECOMP/DISCECTOMY FUSION  3536   C5 - 7   APPLICATION OF WOUND VAC Left 10/26/2018   Procedure: APPLICATION OF WOUND VAC;  Surgeon: Newt Minion, MD;  Location: [place];  Service: Orthopedics;  Laterality: Left;   BACK SURGERY     CARDIAC CATHETERIZATION  05-22-2008   DR SKAINS   NO SIGNIFECANT CAD/ NORMAL LV/  EF 65%/  NO WALL MOTION ABNORMALITIES   CARPAL TUNNEL RELEASE Right 04-25-2013   CATARACT EXTRACTION Right 10/24/2018   CESAREAN SECTION  1989; 1992   COLON SURGERY     COLONOSCOPY     CYSTO N/A 04/30/2013   Procedure: CYSTOSCOPY;  Surgeon: Reece Packer, MD;  Location: [facility];  Service: Urology;  Laterality: N/A;   CYSTOSCOPY MACROPLASTIQUE IMPLANT N/A 02/06/2018   Procedure: CYSTOSCOPY MACROPLASTIQUE IMPLANT;  Surgeon: Bjorn Loser, MD;  Location[facility];  Service: Urology;  Laterality: N/A;   CYSTOSCOPY WITH INJECTION  05/04/2012   Procedure: CYSTOSCOPY WITH INJECTION;  Surgeon: Reece Packer, MD;  Location: [place];  Service: Urology;  Laterality: N/A;  MACROPLASTIQUE INJECTION   CYSTOSCOPY WITH INJECTION  08/28/2012   Procedure: CYSTOSCOPY WITH INJECTION;  Surgeon: Reece Packer, MD;  Location: [place];  Service: Urology;   Laterality: N/A;  cysto and macroplastique    FOOT SURGERY Bilateral    "related to [place]"   [place]   "stomach"   I & D EXTREMITY Left 10/25/2018   Procedure: IRRIGATION AND DEBRIDEMENT GREAT TOE;  Surgeon: Meredith Pel, MD;  Location: [place];  Service: Orthopedics;  Laterality: Left;   INCISION AND DRAINAGE Left 10/25/2018   GREAT TOE   INCISION AND DRAINAGE OF WOUND Right 04/11/2018   Procedure: IRRIGATION AND DEBRIDEMENT WOUND right foot and right ankle;  Surgeon: Evelina Bucy, DPM;  Location: [facility];  Service: Podiatry;  Laterality: Right;   KNEE ARTHROSCOPY Left    KNEE ARTHROSCOPY W/ ALLOGRAFT IMPANT Left    graft x 2   KNEE SURGERY     TOTAL 8 SURG'S   LAPAROSCOPIC SIGMOID COLECTOMY N/A 04/22/2015   Procedure: LAPAROSCOPIC HAND ASSISTED SIGMOID COLECTOMY;  Surgeon: Erroll Luna, MD;  Location: [place];  Service: General;  Laterality: N/A;   LEFT HEART CATHETERIZATION WITH CORONARY ANGIOGRAM N/A 02/12/2014   Procedure: LEFT HEART CATHETERIZATION WITH CORONARY ANGIOGRAM;  Surgeon: Candee Furbish, MD;  Location: [facility];  Service: Cardiovascular;  Laterality: N/A;   No angiographically significant CAD;  normal LVSF, LVEDP 107mmHg,  EF 55% (new finding ef 30% myoview 01-08-2014)   LUMBAR FUSION     MANDIBLE FRACTURE SURGERY     MULTIPLE LAPAROSCOPIES FOR ENDOMETRIOSIS     PUBOVAGINAL SLING  12/29/2011   Procedure: Gaynelle Arabian;  Surgeon: Reece Packer, MD;  Location: [facility];  Service: Urology;  Laterality: N/A;  cysto and sparc sling    PUBOVAGINAL SLING N/A 04/30/2013   Procedure: REMOVAL OF VAGINAL MESH;  Surgeon: Reece Packer, MD;  Location[facility];  Service: Urology;  Laterality: N/A;   RECONSTURCTION OF CONGENITAL UTERUS ANOMALY  1983   REPEAT RECONSTRUCTION ACL LEFT KNEE/ SCREWS REMOVED  03-28-2000   CADAVER GRAFT   TOTAL ABDOMINAL HYSTERECTOMY  1997   w/BSO   TRANSTHORACIC ECHOCARDIOGRAM  02/13/2014   ef 45%, hypokinesis  base inferior and base inferolateral walls   UPPER GI ENDOSCOPY     WOUND DEBRIDEMENT Right 09/05/2016   Procedure: DEBRIDEMENT WOUND WITH GRAFT RIGHT FOOT;  Surgeon: Landis Martins, DPM;  Location: [place];  Service: Podiatry;  Laterality: Right;   WRIST FRACTURE SURGERY      Family History  Problem Relation Age of Onset   Hypertension Mother    Diabetes Mother    Cancer - Other Mother        lymphoma    Cancer - Other Father        lung, bladder cancer    Heart attack Father    Other Father        Lynch syndrome   Cancer - Other Brother        bladder cancer    Bladder Cancer Paternal Aunt        Lynch syndrome   Other Paternal Uncle        Lynch syndrome   Other Cousin        Lynch syndrome    Social History:  reports that she has been smoking e-cigarettes. She has never used smokeless tobacco. She reports that she does not currently use alcohol. She reports that she does not currently use drugs.  Review of Systems -   HYPOTHYROIDISM:  She had routine lab work done by her PCP and her TSH was 8.5 She was started on levothyroxine 25 mcg daily Follow-up TSH normal  Lab Results  Component Value Date   TSH 1.34 09/20/2021   TSH 1.30 12/22/2020   TSH 1.02 10/20/2020   FREET4 0.61 09/20/2021   FREET4 0.78 12/22/2020   FREET4 0.64 10/20/2020    She has history of high triglycerides treated with fenofibrate    Lab Results  Component Value Date   CHOL 145 09/20/2021   HDL 39.10 09/20/2021   LDLCALC 51 12/22/2020   LDLDIRECT 66.0 09/20/2021   TRIG 376.0 (H) 09/20/2021   CHOLHDL 4 09/20/2021     Painful neuropathy: She has had persistent symptoms including leg pains tingling and numbness as well as difficulty with balance  Has been on gabapentin but does not get enough relief with this   Did not want Lyrica because of potential for weight gain   Foot exams: Last showed absent pedal pulses and Absent distal sensation  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 Treated with 50 mg losartan, appears not to be taking the previously prescribed bisoprolol 5 mg  On her  last visit was given Lasix but she is not taking this now  Also on Jardiance  BP Readings from Last 3 Encounters:  11/04/21 120/76  09/20/21 138/70  07/23/21 (!) 170/85   She has had hypokalemia at times    Examination:   BP 120/76   Pulse (!) 106   Ht 5\' 5"  (1.651 m)   Wt 188 lb 9.6 oz (85.5 kg)   SpO2 99%   BMI 31.38 kg/m   Body mass index is 31.38 kg/m.   Trace ankle edema on the left  Assesment/Plan:   DIABETES  type 2, with mild obesity, insulin requiring  See history of present illness for detailed discussion of current diabetes management, blood sugar patterns and problems identified  Her A1c is last 7.4  However her freestyle libre could not be downloaded today since she did not bring her meter  Overall however she appears to be doing better with adding Victoza to her low-dose Lantus, 25 mg Jardiance and metformin  Weight has come back up with improved hyperglycemia However will need to check fructosamine to verify her level of control  She will continue the same regimen but let us know if she has any tendency to blood sugars below 80 at home in the morning  Mild hypothyroidism: Recheck thyroid levels on next visit  Neuropathy with sensory loss: Advised to make appointment with podiatrist for regular follow-up, she does not report any new ulcers  HYPERTENSION: Controlled  To check labs today to recheck potassium      There are no Patient Instructions on file for this visit.         Elayne Snare 11/04/2021, 2:03 PM

## 2021-11-04 NOTE — Patient Instructions (Addendum)
Check blood sugars on waking up   Also check blood sugars about 2 hours after meals and do this after different meals by rotation  Recommended blood sugar levels on waking up are 90-130 and about 2 hours after meal is 130-160  Please bring your blood sugar monitor to each visit, thank you  See your podiatrist

## 2021-11-05 LAB — FRUCTOSAMINE: Fructosamine: 251 umol/L (ref 0–285)

## 2021-11-06 ENCOUNTER — Other Ambulatory Visit: Payer: Self-pay | Admitting: Endocrinology

## 2021-12-03 ENCOUNTER — Other Ambulatory Visit: Payer: Medicare Other

## 2022-01-02 ENCOUNTER — Other Ambulatory Visit: Payer: Self-pay

## 2022-01-02 ENCOUNTER — Emergency Department (HOSPITAL_COMMUNITY): Payer: Medicare Other

## 2022-01-02 ENCOUNTER — Encounter (HOSPITAL_COMMUNITY): Payer: Self-pay | Admitting: Emergency Medicine

## 2022-01-02 ENCOUNTER — Ambulatory Visit (HOSPITAL_COMMUNITY)
Admission: EM | Admit: 2022-01-02 | Discharge: 2022-01-02 | Disposition: A | Discharge status: Home / Self Care | Payer: Medicare Other

## 2022-01-02 ENCOUNTER — Encounter (HOSPITAL_COMMUNITY): Payer: Self-pay

## 2022-01-02 ENCOUNTER — Emergency Department (HOSPITAL_COMMUNITY)
Admission: EM | Admit: 2022-01-02 | Discharge: 2022-01-03 | Disposition: A | Discharge status: Home / Self Care | Payer: Medicare Other | Attending: Emergency Medicine | Admitting: Emergency Medicine

## 2022-01-02 DIAGNOSIS — R030 Elevated blood-pressure reading, without diagnosis of hypertension: Secondary | ICD-10-CM

## 2022-01-02 DIAGNOSIS — E876 Hypokalemia: Secondary | ICD-10-CM | POA: Diagnosis not present

## 2022-01-02 DIAGNOSIS — R5383 Other fatigue: Secondary | ICD-10-CM

## 2022-01-02 DIAGNOSIS — R109 Unspecified abdominal pain: Secondary | ICD-10-CM | POA: Diagnosis not present

## 2022-01-02 DIAGNOSIS — R6883 Chills (without fever): Secondary | ICD-10-CM | POA: Diagnosis not present

## 2022-01-02 DIAGNOSIS — R5381 Other malaise: Secondary | ICD-10-CM

## 2022-01-02 DIAGNOSIS — R197 Diarrhea, unspecified: Secondary | ICD-10-CM | POA: Diagnosis not present

## 2022-01-02 DIAGNOSIS — R531 Weakness: Secondary | ICD-10-CM

## 2022-01-02 DIAGNOSIS — Z20822 Contact with and (suspected) exposure to covid-19: Secondary | ICD-10-CM | POA: Insufficient documentation

## 2022-01-02 DIAGNOSIS — I10 Essential (primary) hypertension: Secondary | ICD-10-CM

## 2022-01-02 DIAGNOSIS — Z8673 Personal history of transient ischemic attack (TIA), and cerebral infarction without residual deficits: Secondary | ICD-10-CM

## 2022-01-02 DIAGNOSIS — E1165 Type 2 diabetes mellitus with hyperglycemia: Secondary | ICD-10-CM

## 2022-01-02 DIAGNOSIS — E1159 Type 2 diabetes mellitus with other circulatory complications: Secondary | ICD-10-CM

## 2022-01-02 LAB — CBC WITH DIFFERENTIAL/PLATELET
Abs Immature Granulocytes: 0.04 10*3/uL (ref 0.00–0.07)
Basophils Absolute: 0.1 10*3/uL (ref 0.0–0.1)
Basophils Relative: 1 %
Eosinophils Absolute: 0.1 10*3/uL (ref 0.0–0.5)
Eosinophils Relative: 1 %
HCT: 41.3 % (ref 36.0–46.0)
Hemoglobin: 13.7 g/dL (ref 12.0–15.0)
Immature Granulocytes: 0 %
Lymphocytes Relative: 20 %
Lymphs Abs: 2.4 10*3/uL (ref 0.7–4.0)
MCH: 30.2 pg (ref 26.0–34.0)
MCHC: 33.2 g/dL (ref 30.0–36.0)
MCV: 91 fL (ref 80.0–100.0)
Monocytes Absolute: 0.5 10*3/uL (ref 0.1–1.0)
Monocytes Relative: 4 %
Neutro Abs: 8.7 10*3/uL — ABNORMAL HIGH (ref 1.7–7.7)
Neutrophils Relative %: 74 %
Platelets: 306 10*3/uL (ref 150–400)
RBC: 4.54 MIL/uL (ref 3.87–5.11)
RDW: 13.1 % (ref 11.5–15.5)
WBC: 11.7 10*3/uL — ABNORMAL HIGH (ref 4.0–10.5)
nRBC: 0 % (ref 0.0–0.2)

## 2022-01-02 LAB — COMPREHENSIVE METABOLIC PANEL
ALT: 17 U/L (ref 0–44)
AST: 21 U/L (ref 15–41)
Albumin: 4.1 g/dL (ref 3.5–5.0)
Alkaline Phosphatase: 46 U/L (ref 38–126)
Anion gap: 10 (ref 5–15)
BUN: 8 mg/dL (ref 6–20)
CO2: 27 mmol/L (ref 22–32)
Calcium: 9.3 mg/dL (ref 8.9–10.3)
Chloride: 102 mmol/L (ref 98–111)
Creatinine, Ser: 0.86 mg/dL (ref 0.44–1.00)
GFR, Estimated: >60 mL/min (ref >60)
Glucose, Bld: 106 mg/dL — ABNORMAL HIGH (ref 70–99)
Potassium: 2.6 mmol/L — CL (ref 3.5–5.1)
Sodium: 139 mmol/L (ref 135–145)
Total Bilirubin: 0.7 mg/dL (ref 0.3–1.2)
Total Protein: 7 g/dL (ref 6.5–8.1)

## 2022-01-02 LAB — POC INFLUENZA A AND B ANTIGEN (URGENT CARE ONLY)
INFLUENZA A ANTIGEN, POC: NEGATIVE
INFLUENZA B ANTIGEN, POC: NEGATIVE

## 2022-01-02 LAB — CBG MONITORING, ED: Glucose-Capillary: 190 mg/dL — ABNORMAL HIGH (ref 70–99)

## 2022-01-02 MED ORDER — SODIUM CHLORIDE 0.9 % IV BOLUS
1000.0000 mL | Freq: Once | INTRAVENOUS | Status: AC
  Administered 2022-01-03: 1000 mL via INTRAVENOUS

## 2022-01-02 MED ORDER — POTASSIUM CHLORIDE 10 MEQ/100ML IV SOLN
10.0000 meq | INTRAVENOUS | Status: AC
  Administered 2022-01-03: 10 meq via INTRAVENOUS
  Filled 2022-01-02: qty 100

## 2022-01-02 MED ORDER — POTASSIUM CHLORIDE CRYS ER 20 MEQ PO TBCR
40.0000 meq | EXTENDED_RELEASE_TABLET | Freq: Once | ORAL | Status: AC
  Administered 2022-01-03: 40 meq via ORAL
  Filled 2022-01-02: qty 2

## 2022-01-02 MED ORDER — MAGNESIUM SULFATE 2 GM/50ML IV SOLN
2.0000 g | Freq: Once | INTRAVENOUS | Status: AC
  Administered 2022-01-03: 2 g via INTRAVENOUS
  Filled 2022-01-02: qty 50

## 2022-01-02 NOTE — ED Triage Notes (Signed)
Pt endorses feeling weak for a week including weakness and bilateral hand weakness Negative flu test at [facility] today. Reports generalized body aches.

## 2022-01-02 NOTE — ED Provider Triage Note (Signed)
Emergency Medicine Provider Triage Evaluation Note  [name] , a 56 y.o. female  was evaluated in triage.  Pt complains of generalized worsening weakness, shaking, "slobbering". Hx of stroke, DM, AKA on right. No chest pain, SOB. No recent falls. Endorses some recent falls. Sent from urgent care for concern for stroke. Sx ongoing for >1 week. No new medications. Not on blood thinner at this time.  Review of Systems  Positive: As above Negative: As above  Physical Exam  BP (!) 159/89    Pulse 80    Temp 98.5 F (36.9 C) (Oral)    Resp 18    SpO2 97%  Gen:   Awake, no distress, quivering lip throughout interview Resp:  Normal effort  MSK:   Moves extremities without difficulty, decreased strength globally around 3-4/5 Other:  Aox3. Small droop of left lift -- unclear whether this was a deficit from previous stroke  Medical Decision Making  Medically screening exam initiated at 7:10 PM.  Appropriate orders placed.  [name] was informed that the remainder of the evaluation will be completed by another provider, this initial triage assessment does not replace that evaluation, and the importance of remaining in the ED until their evaluation is complete.  Workup initiated    [name] 01/02/22 1914

## 2022-01-02 NOTE — Discharge Instructions (Addendum)
Please report to the emergency room now as I am concerned you may be having a recurrent stroke.

## 2022-01-02 NOTE — ED Triage Notes (Signed)
Pt presents with chills, fatigue, and loss of sensation with hands and feet X 1 week; pt just started a medication for sleep insomnia bout a month ago.

## 2022-01-02 NOTE — ED Notes (Addendum)
Lab called, Potassium 2.6, PA in triage made aware

## 2022-01-02 NOTE — ED Provider Notes (Signed)
[place]   MRN: [id number] DOB: May 16, 1966  Subjective:   [name] is a 56 y.o. female presenting for 1 week history of worsening chills, fatigue, loss of sensation in the hands and the feet.  Patient presents with her daughter who reports that she is concerned about having a stroke as her current presentation is similar to the last time she had one.  Patient is a type II diabetic, treated with insulin.  Reports that her blood sugar has been okay but she did drink a [employer] prior to coming into our clinic.  She has not had any chest pain, shortness of breath or wheezing.  She has been taking a medication for insomnia for the past month and is worried that this is related.  She does have a history of peripheral neuropathy and has had a right-sided below the knee amputation.  No current facility-administered medications for this encounter.  Current Outpatient Medications:    Daridorexant HCl 50 MG TABS, Take by mouth., Disp: , Rfl:    aspirin-acetaminophen-caffeine (EXCEDRIN MIGRAINE) 250-250-65 MG tablet, Take 2 tablets by mouth every 6 (six) hours as needed for headache., Disp: , Rfl:    atorvastatin (LIPITOR) 40 MG tablet, Take 40 mg by mouth at bedtime. (Patient not taking: Reported on 11/04/2021), Disp: , Rfl:    bisoprolol (ZEBETA) 5 MG tablet, Take 1 tablet (5 mg total) by mouth daily. (Patient not taking: Reported on 11/04/2021), Disp: 90 tablet, Rfl: 3   Continuous Blood Gluc Sensor (FREESTYLE LIBRE 14 DAY SENSOR) MISC, 2 each by Does not apply route every 14 (fourteen) days. Use 1 sensor every 14 days to monitor blood sugar., Disp: 2 each, Rfl: 5   diazepam (VALIUM) 5 MG tablet, Take 5 mg by mouth 2 (two) times daily. , Disp: , Rfl:    DULoxetine (CYMBALTA) 30 MG capsule, Take 30 mg by mouth 2 (two) times daily., Disp: , Rfl:    esomeprazole (NEXIUM) 40 MG capsule, Take 40 mg by mouth daily. , Disp: , Rfl:    fenofibrate (TRICOR) 48 MG tablet, Take 48 mg  by mouth daily. (Patient not taking: No sig reported), Disp: , Rfl:    furosemide (LASIX) 20 MG tablet, TAKE 1 TABLET (20 MG TOTAL) BY MOUTH DAILY. FOR LEG SWELLING (Patient not taking: Reported on 11/04/2021), Disp: 30 tablet, Rfl: 1   gabapentin (NEURONTIN) 600 MG tablet, Take 600 mg by mouth 3 (three) times daily. , Disp: , Rfl:    glucose blood (FREESTYLE TEST STRIPS) test strip, USE TO CHECK BLOOD SUGARS DAILY. DX CODE E11.65, Disp: 50 each, Rfl: 3   INGREZZA 80 MG CAPS, Take 80 mg by mouth at bedtime., Disp: , Rfl:    insulin glargine (LANTUS SOLOSTAR) 100 UNIT/ML Solostar Pen, Injuect 10 units daily, if blood sugars are 150-200 only inject 6 units, Disp: 9 mL, Rfl: 3   Insulin Pen Needle (BD PEN NEEDLE NANO 2ND GEN) 32G X 4 MM MISC, 1 each by Does not apply route daily. Use to inject insulin daily., Disp: 100 each, Rfl: 1   JARDIANCE 25 MG TABS tablet, TAKE 1 TABLET BY MOUTH EVERY DAY, Disp: 30 tablet, Rfl: 5   levothyroxine (SYNTHROID) 25 MCG tablet, TAKE 1 TABLET BY MOUTH EVERY DAY BEFORE BREAKFAST, Disp: 90 tablet, Rfl: 1   LINZESS 72 MCG capsule, as needed. , Disp: , Rfl:    losartan (COZAAR) 50 MG tablet, TAKE 1 TABLET BY MOUTH EVERY DAY (Patient not  taking: Reported on 11/04/2021), Disp: 90 tablet, Rfl: 1   metFORMIN (GLUCOPHAGE-XR) 750 MG 24 hr tablet, TAKE 1 TABLET (750 MG TOTAL) BY MOUTH 2 (TWO) TIMES DAILY., Disp: 180 tablet, Rfl: 1   oxyCODONE-acetaminophen (PERCOCET) 10-325 MG tablet, , Disp: , Rfl:    pantoprazole (PROTONIX) 40 MG tablet, , Disp: , Rfl:    Polyethylene Glycol 3350 (LAXATIVE POLYETHYLENE GLYCOL PO), 17g (1 capful) in a drink, Disp: , Rfl:    QUEtiapine (SEROQUEL) 300 MG tablet, Take 600 mg by mouth at bedtime. One hour before bed, Disp: , Rfl: 1   spironolactone (ALDACTONE) 25 MG tablet, TAKE 1 TABLET (25 MG TOTAL) BY MOUTH DAILY. (Patient not taking: Reported on 11/04/2021), Disp: 90 tablet, Rfl: 1   VICTOZA 18 MG/3ML SOPN, INJECT 1.8 MG UNDER THE SKIN ONCE  DAILY, Disp: 9 mL, Rfl: 2   VRAYLAR 6 MG CAPS, Take 6 mg by mouth at bedtime. , Disp: , Rfl: 1   Allergies  Allergen Reactions   Latex Itching, Rash and Other (See Comments)    Pt states she cannot use condoms - cause an infection.  Use of latex on skin is okay for short period of time.  Tape causes rash   Sweetening Enhancer [Flavoring Agent] Nausea And Vomiting and Other (See Comments)    HEADACHES   Ropinirole     Pt stated, "I am getting cramps in my legs - especially at night"   Aspartame And Phenylalanine Nausea And Vomiting    HEADACHES   Ibuprofen Other (See Comments)    HEADACHES   Trazodone And Nefazodone Other (See Comments)    Hallucinations     Past Medical History:  Diagnosis Date   Anxiety    Arthritis    Bipolar disorder ([place])    Broken jaw (HCC)    Broken wrist    Chronic back pain    Claudication ([place])    a. 12/2013 ABI's: R 0.97, L 0.94.   Colon cancer ([place])    COPD (chronic obstructive pulmonary disease) (HCC)    Depression    Diabetic Charcot's foot (HCC)    Diabetic foot ulcer ([place])    chronic left foot ulcer , great toe/  hx recurrent foot ulcer bilaterally   DJD (degenerative joint disease)    Edema of both lower extremities    Episode of memory loss    Family history of bladder cancer    Family history of colon cancer    Family history of kidney cancer    GERD (gastroesophageal reflux disease)    Headache(784.0)    Hiatal hernia    History of colon cancer, stage I dx 02-26-2015--- oncologist-- dr [name]--- per last note no recurrence   05-16-2015 s/p  Laparoscopic sigmoid colectomy w/ node bx's (negative nodes per path)  Stage I (pT1,N0,M0) Grade 2   History of CVA with residual deficit 02-12-2014  post op cardiac cath.   per MRI multiple small strokes post cardiac cath. --  residual mild memory loss   History of methicillin resistant staphylococcus aureus (MRSA)    Hypercholesterolemia    Hypertension    Insomnia    Insulin dependent type 2  diabetes mellitus, uncontrolled dx 2004   endocrinologist-  dr Dwyane Dee-- last A1c 8.8 in Aug2018:  pt is noncompliant w/ diet, states does note eat breakfast , her first main meal in afternoon   Neurogenic bladder    Neuropathy, diabetic (HCC)    hands and feet   OSA (obstructive sleep apnea)  cpap intolerant   Personality disorder (HCC)    Restless leg syndrome    SOB (shortness of breath) on exertion    Stroke [facility])    SUI (stress urinary incontinence, female) S/P SLING 12-29-2011     Past Surgical History:  Procedure Laterality Date   AMPUTATION Right 04/13/2018   Procedure: RIGHT BELOW KNEE AMPUTATION;  Surgeon: Newt Minion, MD;  Location: [place];  Service: Orthopedics;  Laterality: Right;   AMPUTATION Left 10/26/2018   Procedure: LEFT FOOT FIRST RAY AMPUTATION;  Surgeon: Newt Minion, MD;  Location: [place];  Service: Orthopedics;  Laterality: Left;   AMPUTATION TOE Left 05/10/2019   Procedure: AMPUTATION 2nd left TOE WITH METATARSAL;  Surgeon: Landis Martins, DPM;  Location: [place];  Service: Podiatry;  Laterality: Left;   ANTERIOR CERVICAL DECOMP/DISCECTOMY FUSION  7782   C5 - 7   APPLICATION OF WOUND VAC Left 10/26/2018   Procedure: APPLICATION OF WOUND VAC;  Surgeon: Newt Minion, MD;  Location: [place];  Service: Orthopedics;  Laterality: Left;   BACK SURGERY     CARDIAC CATHETERIZATION  05-22-2008   DR SKAINS   NO SIGNIFECANT CAD/ NORMAL LV/  EF 65%/  NO WALL MOTION ABNORMALITIES   CARPAL TUNNEL RELEASE Right 04-25-2013   CATARACT EXTRACTION Right 10/24/2018   CESAREAN SECTION  1989; 1992   COLON SURGERY     COLONOSCOPY     CYSTO N/A 04/30/2013   Procedure: CYSTOSCOPY;  Surgeon: Reece Packer, MD;  Location: [facility];  Service: Urology;  Laterality: N/A;   CYSTOSCOPY MACROPLASTIQUE IMPLANT N/A 02/06/2018   Procedure: CYSTOSCOPY MACROPLASTIQUE IMPLANT;  Surgeon: Bjorn Loser, MD;  Location: [facility];  Service: Urology;  Laterality: N/A;   CYSTOSCOPY  WITH INJECTION  05/04/2012   Procedure: CYSTOSCOPY WITH INJECTION;  Surgeon: Reece Packer, MD;  Location: [place];  Service: Urology;  Laterality: N/A;  MACROPLASTIQUE INJECTION   CYSTOSCOPY WITH INJECTION  08/28/2012   Procedure: CYSTOSCOPY WITH INJECTION;  Surgeon: Reece Packer, MD;  Location: [place];  Service: Urology;  Laterality: N/A;  cysto and macroplastique    FOOT SURGERY Bilateral    "related to [place]"   [place]   "stomach"   I & D EXTREMITY Left 10/25/2018   Procedure: IRRIGATION AND DEBRIDEMENT GREAT TOE;  Surgeon: Meredith Pel, MD;  Location: [place];  Service: Orthopedics;  Laterality: Left;   INCISION AND DRAINAGE Left 10/25/2018   GREAT TOE   INCISION AND DRAINAGE OF WOUND Right 04/11/2018   Procedure: IRRIGATION AND DEBRIDEMENT WOUND right foot and right ankle;  Surgeon: Evelina Bucy, DPM;  Location: [facility];  Service: Podiatry;  Laterality: Right;   KNEE ARTHROSCOPY Left    KNEE ARTHROSCOPY W/ ALLOGRAFT IMPANT Left    graft x 2   KNEE SURGERY     TOTAL 8 SURG'S   LAPAROSCOPIC SIGMOID COLECTOMY N/A 04/22/2015   Procedure: LAPAROSCOPIC HAND ASSISTED SIGMOID COLECTOMY;  Surgeon: Erroll Luna, MD;  Location: [place];  Service: General;  Laterality: N/A;   LEFT HEART CATHETERIZATION WITH CORONARY ANGIOGRAM N/A 02/12/2014   Procedure: LEFT HEART CATHETERIZATION WITH CORONARY ANGIOGRAM;  Surgeon: Candee Furbish, MD;  Location[facility];  Service: Cardiovascular;  Laterality: N/A;   No angiographically significant CAD; normal LVSF, LVEDP 69mmHg,  EF 55% (new finding ef 30% myoview 01-08-2014)   LUMBAR FUSION     MANDIBLE FRACTURE SURGERY     MULTIPLE LAPAROSCOPIES  FOR ENDOMETRIOSIS     PUBOVAGINAL SLING  12/29/2011   Procedure: Gaynelle Arabian;  Surgeon: Reece Packer, MD;  Location[facility];  Service: Urology;  Laterality: N/A;  cysto and sparc sling    PUBOVAGINAL SLING N/A 04/30/2013    Procedure: REMOVAL OF VAGINAL MESH;  Surgeon: Reece Packer, MD;  Location[facility];  Service: Urology;  Laterality: N/A;   RECONSTURCTION OF CONGENITAL UTERUS ANOMALY  1983   REPEAT RECONSTRUCTION ACL LEFT KNEE/ SCREWS REMOVED  03-28-2000   CADAVER GRAFT   TOTAL ABDOMINAL HYSTERECTOMY  1997   w/BSO   TRANSTHORACIC ECHOCARDIOGRAM  02/13/2014   ef 45%, hypokinesis base inferior and base inferolateral walls   UPPER GI ENDOSCOPY     WOUND DEBRIDEMENT Right 09/05/2016   Procedure: DEBRIDEMENT WOUND WITH GRAFT RIGHT FOOT;  Surgeon: Landis Martins, DPM;  Location: [place];  Service: Podiatry;  Laterality: Right;   WRIST FRACTURE SURGERY      Family History  Problem Relation Age of Onset   Hypertension Mother    Diabetes Mother    Cancer - Other Mother        lymphoma    Cancer - Other Father        lung, bladder cancer    Heart attack Father    Other Father        Lynch syndrome   Cancer - Other Brother        bladder cancer    Bladder Cancer Paternal Aunt        Lynch syndrome   Other Paternal Uncle        Lynch syndrome   Other Cousin        Lynch syndrome    Social History   Tobacco Use   Smoking status: Every Day    Types: E-cigarettes   Smokeless tobacco: Never   Tobacco comments:    per pt started smoking  age 89  Vaping Use   Vaping Use: Every day   Devices: "tried vaping for 1 yr; didn't work out"   Substance Use Topics   Alcohol use: Not Currently    Comment:  "special occasions only"   Drug use: Not Currently    ROS   Objective:   Vitals: BP (!) 185/82 (BP Location: Right Arm)    Pulse 87    Temp 98.3 F (36.8 C) (Oral)    Resp 18    SpO2 100%   BP Readings from Last 3 Encounters:  01/02/22 (!) 185/82  11/04/21 120/76  09/20/21 138/70    Physical Exam Constitutional:      General: She is not in acute distress.    Appearance: Normal appearance. She is well-developed. She is not ill-appearing, toxic-appearing or diaphoretic.  HENT:     Head:  Normocephalic and atraumatic.     Right Ear: External ear normal.     Left Ear: External ear normal.     Nose: Nose normal.     Mouth/Throat:     Mouth: Mucous membranes are moist.  Eyes:     Extraocular Movements: Extraocular movements intact.     Conjunctiva/sclera: Conjunctivae normal.  Cardiovascular:     Rate and Rhythm: Normal rate and regular rhythm.     Pulses: Normal pulses.     Heart sounds: Normal heart sounds. No murmur heard.   No friction rub. No gallop.  Pulmonary:     Effort: Pulmonary effort is normal. No respiratory distress.     Breath sounds: Normal  breath sounds. No stridor. No wheezing, rhonchi or rales.  Skin:    General: Skin is warm and dry.     Findings: No rash.  Neurological:     Mental Status: She is alert and oriented to person, place, and time.     Motor: Weakness present.     Comments: Lethargic and slow speech.  Notable tremors of both hands.  Psychiatric:        Mood and Affect: Mood normal.        Behavior: Behavior normal.        Thought Content: Thought content normal.        Judgment: Judgment normal.    Results for orders placed or performed during the hospital encounter of 01/02/22 (from the past 24 hour(s))  POC CBG monitoring     Status: Abnormal   Collection Time: 01/02/22  5:26 PM  Result Value Ref Range   Glucose-Capillary 190 (H) 70 - 99 mg/dL  POC Influenza A & B Ag (Urgent Care)     Status: None   Collection Time: 01/02/22  5:54 PM  Result Value Ref Range   INFLUENZA A ANTIGEN, POC NEGATIVE NEGATIVE   INFLUENZA B ANTIGEN, POC NEGATIVE NEGATIVE   *Note: Due to a large number of results and/or encounters for the requested time period, some results have not been displayed. A complete set of results can be found in Results Review.    Assessment and Plan :   PDMP not reviewed this encounter.  1. Chills   2. Weakness   3. Malaise and fatigue   4. History of CVA (cerebrovascular accident)   5. Elevated blood pressure  reading   6. Essential hypertension    And redirecting patient to the emergency room as I am concerned she may be having a recurrent CVA, stroke.  She has new onset profound weakness, more confusion, slurred speech.  Has a history of stroke.  Her blood sugar was elevated but not starkly so, rapid flu test was negative.  Her blood pressure is also much more elevated than normal.  She presents with her daughter who is in agreement with this, will take her to the emergency room now.   Jaynee Eagles, [place] 01/02/22 1757

## 2022-01-03 ENCOUNTER — Emergency Department (HOSPITAL_COMMUNITY): Payer: Medicare Other

## 2022-01-03 DIAGNOSIS — E876 Hypokalemia: Secondary | ICD-10-CM | POA: Diagnosis present

## 2022-01-03 DIAGNOSIS — R531 Weakness: Secondary | ICD-10-CM | POA: Diagnosis not present

## 2022-01-03 DIAGNOSIS — R197 Diarrhea, unspecified: Secondary | ICD-10-CM | POA: Diagnosis present

## 2022-01-03 LAB — MAGNESIUM: Magnesium: 1.3 mg/dL — ABNORMAL LOW (ref 1.7–2.4)

## 2022-01-03 LAB — CBG MONITORING, ED: Glucose-Capillary: 111 mg/dL — ABNORMAL HIGH (ref 70–99)

## 2022-01-03 LAB — RESP PANEL BY RT-PCR (FLU A&B, COVID) ARPGX2
Influenza A by PCR: NEGATIVE
Influenza B by PCR: NEGATIVE
SARS Coronavirus 2 by RT PCR: NEGATIVE

## 2022-01-03 MED ORDER — IOHEXOL 300 MG/ML  SOLN
100.0000 mL | Freq: Once | INTRAMUSCULAR | Status: AC | PRN
  Administered 2022-01-03: 100 mL via INTRAVENOUS

## 2022-01-03 MED ORDER — POTASSIUM CHLORIDE 10 MEQ/100ML IV SOLN
10.0000 meq | INTRAVENOUS | Status: AC
  Administered 2022-01-03: 10 meq via INTRAVENOUS
  Filled 2022-01-03: qty 100

## 2022-01-03 MED ORDER — POTASSIUM CHLORIDE ER 10 MEQ PO TBCR: 10.0000 meq | EXTENDED_RELEASE_TABLET | Freq: Two times a day (BID) | ORAL | 0 refills | Status: DC

## 2022-01-03 NOTE — ED Provider Notes (Signed)
[facility] Emergency Department Provider Note MRN:  [id number]  Arrival date & time: 01/03/22     Chief Complaint   Weakness   History of Present Illness   [name] is a 56 y.o. year-old female presents to the ED with chief complaint of generalized weakness.  She states that she has been having symptoms for the past week.  She reports associated chills, fatigue, and diarrhea.  She reports having had decreased strength in her upper extremities and states that she hasn't bee able to wipe herself.  She states that this is similar to when she had a prior stroke.  Outside records note increased confusion and slurred speech, noting that her daughter agrees with that.  Had negative head CT in triage.    Review of Systems  Pertinent review of systems noted in HPI.    Physical Exam   Vitals:   01/02/22 1844 01/02/22 2055  BP: (!) 159/89 (!) 157/92  Pulse: 80 73  Resp: 18 18  Temp: 98.5 F (36.9 C)   SpO2: 97% 96%    CONSTITUTIONAL:  Chronically ill-appearing, NAD NEURO:  Alert and oriented x 3, CN 3-12 grossly intact, tremulous upper extremities EYES:  eyes equal and reactive ENT/NECK:  Supple, no stridor  CARDIO:  normal, regular rhythm, appears well-perfused  PULM:  No respiratory distress,  GI/GU:  non-distended,  MSK/SPINE:  No gross deformities, no edema, right AKA SKIN:  no rash, atraumatic   *Additional and/or pertinent findings included in MDM below  Diagnostic and Interventional Summary    EKG Interpretation  Date/Time:  Sunday January 02 2022 18:48:26 EST Ventricular Rate:  73 PR Interval:  134 QRS Duration: 88 QT Interval:  406 QTC Calculation: 447 R Axis:   71 Text Interpretation: Normal sinus rhythm T wave abnormality, consider inferior ischemia Abnormal ECG When compared with ECG of 25-Oct-2018 07:37, PREVIOUS ECG IS PRESENT similar to prior Confirmed by Wynona Dove ([id number]) on 01/02/2022 11:15:11 PM       Labs Reviewed  CBC  WITH DIFFERENTIAL/PLATELET - Abnormal; Notable for the following components:      Result Value   WBC 11.7 (*)    Neutro Abs 8.7 (*)    All other components within normal limits  COMPREHENSIVE METABOLIC PANEL - Abnormal; Notable for the following components:   Potassium 2.6 (*)    Glucose, Bld 106 (*)    All other components within normal limits  RESP PANEL BY RT-PCR (FLU A&B, COVID) ARPGX2  MAGNESIUM    CT Head Wo Contrast  Final Result    DG Chest 2 View  Final Result    CT ABDOMEN PELVIS W CONTRAST    (Results Pending)    Medications  potassium chloride SA (KLOR-CON M) CR tablet 40 mEq (has no administration in time range)  potassium chloride 10 mEq in 100 mL IVPB (has no administration in time range)  magnesium sulfate IVPB 2 g 50 mL (has no administration in time range)  sodium chloride 0.9 % bolus 1,000 mL (has no administration in time range)     Procedures  /  Critical Care Procedures  ED Course and Medical Decision Making  I have reviewed the triage vital signs, the nursing notes, and pertinent available records from the EMR.  Complexity of Problems Addressed Acute illness or injury that poses threat of life of bodily function  Additional Data Reviewed and Analyzed Further history obtained from: Past medical history and medications listed in the EMR, Prior ED  visit notes, and Care Everywhere    ED Course Clinical Course as of 01/03/22 0049  Mon Jan 03, 2022  0037 Potassium(!!): 2.6 [SG]    Clinical Course User Index [SG] Jeanell Sparrow, DO    I personally reviewed the labs, x-ray, CT, EKG, and cardiac monitor.  Specifics are noted.  Mild leukocytosis to 11.7  Cardiac monitor continues to shows NSR.   Patient here with generalized weakness, diarrhea, fatigue.  Had reported slurred speech and increased confusion.    K is noted to be low at 2.6.  She has had diarrhea and has some lower abdominal tenderness.  Will check CT abd/pel to further evaluated  the abdominal tenderness.  I believe that her hypokalemia could be causing some of her weakness and fatigue, but given hx, will consult with neurology.    1:09 AM I consulted with neurology, Dr. Lorrin Goodell, who has reviewed patient's chart and we discussed the symptoms.  Recommends checking MRI due to poorly controlled risk factors.  MRI shows no new stroke.  Patient reassessed, she states that she feels significantly better after potassium supplementation and fluid bolus.  I doubt myelopathic process.  Do not feel that emergent C-spine MRI is indicated at this time.  Patient seen by and discussed with Dr. Pearline Cables, who agrees with discharge plan.  We will have patient have her potassium rechecked in 3 days.  She and I discussed that this could be accomplished at an urgent care.  We will give a couple of days of potassium.    Final Clinical Impressions(s) / ED Diagnoses     ICD-10-CM   1. Weakness  R53.1     2. Hypokalemia  E87.6     3. Hypomagnesemia  E83.42       ED Discharge Orders          Ordered    potassium chloride (KLOR-CON) 10 MEQ tablet  2 times daily        01/03/22 0615             Discharge Instructions Discussed with and Provided to Patient:   Discharge Instructions   None      Montine Circle, PA-C 01/03/22 [place], [place], DO 01/03/22 [phone]

## 2022-01-03 NOTE — Discharge Instructions (Signed)
Your symptoms are thought to be attributed to hypokalemia (low potassium).  You were given potassium supplements while in the emergency department.  I have prescribed a short course of potassium for you to take over the next day or 2.  I recommend that you have your potassium rechecked in about 3 days.  If your symptoms change or worsen, please return to the emergency department.  The remainder of your emergency department work-up, CT scan, and MRI were negative for emergent findings.

## 2022-01-07 ENCOUNTER — Encounter (HOSPITAL_COMMUNITY): Payer: Self-pay | Admitting: *Deleted

## 2022-01-07 ENCOUNTER — Other Ambulatory Visit: Payer: Self-pay

## 2022-01-07 ENCOUNTER — Ambulatory Visit (HOSPITAL_COMMUNITY)
Admission: EM | Admit: 2022-01-07 | Discharge: 2022-01-07 | Disposition: A | Discharge status: Home / Self Care | Payer: Medicare Other | Attending: Physician Assistant | Admitting: Physician Assistant

## 2022-01-07 DIAGNOSIS — E876 Hypokalemia: Secondary | ICD-10-CM | POA: Insufficient documentation

## 2022-01-07 DIAGNOSIS — I10 Essential (primary) hypertension: Secondary | ICD-10-CM | POA: Insufficient documentation

## 2022-01-07 LAB — BASIC METABOLIC PANEL
Anion gap: 13 (ref 5–15)
BUN: 7 mg/dL (ref 6–20)
CO2: 24 mmol/L (ref 22–32)
Calcium: 8.7 mg/dL — ABNORMAL LOW (ref 8.9–10.3)
Chloride: 101 mmol/L (ref 98–111)
Creatinine, Ser: 0.89 mg/dL (ref 0.44–1.00)
GFR, Estimated: >60 mL/min (ref >60)
Glucose, Bld: 209 mg/dL — ABNORMAL HIGH (ref 70–99)
Potassium: 3.3 mmol/L — ABNORMAL LOW (ref 3.5–5.1)
Sodium: 138 mmol/L (ref 135–145)

## 2022-01-07 MED ORDER — POTASSIUM CHLORIDE ER 10 MEQ PO TBCR: 10.0000 meq | EXTENDED_RELEASE_TABLET | Freq: Two times a day (BID) | ORAL | 0 refills | Status: DC

## 2022-01-07 MED ORDER — LISINOPRIL 20 MG PO TABS: 20.0000 mg | ORAL_TABLET | Freq: Every day | ORAL | 1 refills | Status: DC

## 2022-01-07 NOTE — ED Provider Notes (Signed)
[place]    CSN: [id number] Arrival date & time: 01/07/22  1608      History   Chief Complaint No chief complaint on file.   HPI [name] is a 56 y.o. female.   Patient presents today for follow-up.  She was seen on 01/02/2022 at which point she had weakness and chills and was sent to the emergency room.  She was found to be significantly hypokalemic with a potassium of 2.6.  She was given repletion via IV and sent home with supplement which she has been taking regularly.  She does take Lasix intermittently but is also taking spironolactone and lisinopril.  She is in the process of establishing with a new PCP.  Reports she is feeling much better and denies any significant weakness, confusion, nausea, vomiting.  She is requesting repeat her potassium level today.  Her blood pressure was noted to be very elevated today.  Patient reports she has not been taking many of her blood pressure medications regularly.  She typically does not take them until 8:00 at night as we will be due for her dose soon.  She denies any chest pain, headache, vision changes, confusion, dizziness.   Past Medical History:  Diagnosis Date   Anxiety    Arthritis    Bipolar disorder ([place])    Broken jaw ([place])    Broken wrist    Chronic back pain    Claudication ([place])    a. 12/2013 ABI's: R 0.97, L 0.94.   Colon cancer ([place])    COPD (chronic obstructive pulmonary disease) (HCC)    Depression    Diabetic Charcot's foot (HCC)    Diabetic foot ulcer ([place])    chronic left foot ulcer , great toe/  hx recurrent foot ulcer bilaterally   DJD (degenerative joint disease)    Edema of both lower extremities    Episode of memory loss    Family history of bladder cancer    Family history of colon cancer    Family history of kidney cancer    GERD (gastroesophageal reflux disease)    Headache(784.0)    Hiatal hernia    History of colon cancer, stage I dx 02-26-2015--- oncologist-- dr [name]--- per last  note no recurrence   05-16-2015 s/p  Laparoscopic sigmoid colectomy w/ node bx's (negative nodes per path)  Stage I (pT1,N0,M0) Grade 2   History of CVA with residual deficit 02-12-2014  post op cardiac cath.   per MRI multiple small strokes post cardiac cath. --  residual mild memory loss   History of methicillin resistant staphylococcus aureus (MRSA)    Hypercholesterolemia    Hypertension    Insomnia    Insulin dependent type 2 diabetes mellitus, uncontrolled dx 2004   endocrinologist-  dr Dwyane Dee-- last A1c 8.8 in Aug2018:  pt is noncompliant w/ diet, states does note eat breakfast , her first main meal in afternoon   Neurogenic bladder    Neuropathy, diabetic ([place])    hands and feet   OSA (obstructive sleep apnea)    cpap intolerant   Personality disorder ([place])    Restless leg syndrome    SOB (shortness of breath) on exertion    Stroke [facility])    SUI (stress urinary incontinence, female) S/P SLING 12-29-2011    Patient Active Problem List   Diagnosis Date Noted   Diarrhea 01/03/2022   Hypokalemia 01/03/2022   Palpitations 01/29/2021   Nicotine dependence 03/06/2020   Genetic testing 03/12/2019  Family history of colon cancer    Family history of bladder cancer    Family history of kidney cancer    Postmenopausal bleeding 11/15/2018   Dog bite of foot, sequela    Left foot infection 10/25/2018   Gangrenous disorder ([place]) 09/17/2018   Cellulitis of left upper extremity    Poorly controlled diabetes mellitus ([place])    UTI (urinary tract infection) 91/47/8295   Acute metabolic encephalopathy 62/13/0865   GERD (gastroesophageal reflux disease) 05/09/2018   Tobacco abuse 05/09/2018   Polypharmacy 05/09/2018   Restless leg syndrome 04/27/2018   Anxiety 04/27/2018   Hyponatremia 04/26/2018   Noncompliance with dietary restriction 04/26/2018   Postoperative cellulitis of surgical wound 04/26/2018   At high risk for falls 04/24/2018   Noncompliance with safety precautions  04/24/2018   Leukocytosis 04/24/2018   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   PAD (peripheral artery disease) ([place])    Poorly controlled type 2 diabetes mellitus ([place])    Subacute osteomyelitis, right ankle and foot ([place])    Major depressive disorder, recurrent episode ([place]) 04/11/2018   Diabetic ulcer of right midfoot associated with type 2 diabetes mellitus, with necrosis of bone ([place])    Abscess of ankle    Infectious synovitis    Diabetic foot infection ([place])    Sepsis ([place]) 04/09/2018   Melanocytic nevus 02/28/2018   Lesion of labia 09/27/2017   Vaginitis and vulvovaginitis 02/19/2016   Cancer of sigmoid colon ([place]) 03/09/2015   History of stroke within last year 02/12/2015   OSA (obstructive sleep apnea) 07/04/2014   Migraine without aura, with intractable migraine, so stated, without mention of status migrainosus 03/26/2014   Peripheral neuropathy 03/03/2014   Abnormal stress test 02/12/2014   CVA (cerebral infarction) 02/12/2014   Chest pain    Abnormal heart rhythm    Edema    Hypertension    Hyperlipemia    Hypercholesterolemia    Uncontrolled type 2 diabetes mellitus with hyperglycemia, with long-term current use of insulin ([place]) 07/22/2013   Diabetic neuropathy with neurologic complication ([place]) 78/46/9629   Diabetic polyneuropathy ([place]) 07/22/2013    Past Surgical History:  Procedure Laterality Date   AMPUTATION Right 04/13/2018   Procedure: RIGHT BELOW KNEE AMPUTATION;  Surgeon: Newt Minion, MD;  Location: [facility];  Service: Orthopedics;  Laterality: Right;   AMPUTATION Left 10/26/2018   Procedure: LEFT FOOT FIRST RAY AMPUTATION;  Surgeon: Newt Minion, MD;  Location: [facility];  Service: Orthopedics;  Laterality: Left;   AMPUTATION TOE Left 05/10/2019   Procedure: AMPUTATION 2nd left TOE WITH METATARSAL;  Surgeon: Landis Martins, DPM;  Location: [facility];  Service: Podiatry;  Laterality: Left;   ANTERIOR CERVICAL DECOMP/DISCECTOMY FUSION  5284   C5 - 7    APPLICATION OF WOUND VAC Left 10/26/2018   Procedure: APPLICATION OF WOUND VAC;  Surgeon: Newt Minion, MD;  Location: [facility];  Service: Orthopedics;  Laterality: Left;   BACK SURGERY     CARDIAC CATHETERIZATION  05-22-2008   DR SKAINS   NO SIGNIFECANT CAD/ NORMAL LV/  EF 65%/  NO WALL MOTION ABNORMALITIES   CARPAL TUNNEL RELEASE Right 04-25-2013   CATARACT EXTRACTION Right 10/24/2018   CESAREAN SECTION  1989; 1992   COLON SURGERY     COLONOSCOPY     CYSTO N/A 04/30/2013   Procedure: CYSTOSCOPY;  Surgeon: Reece Packer, MD;  Location: [facility];  Service: Urology;  Laterality: N/A;   CYSTOSCOPY MACROPLASTIQUE IMPLANT N/A 02/06/2018   Procedure: CYSTOSCOPY  MACROPLASTIQUE IMPLANT;  Surgeon: Bjorn Loser, MD;  Location[facility];  Service: Urology;  Laterality: N/A;   CYSTOSCOPY WITH INJECTION  05/04/2012   Procedure: CYSTOSCOPY WITH INJECTION;  Surgeon: Reece Packer, MD;  Location: [facility];  Service: Urology;  Laterality: N/A;  MACROPLASTIQUE INJECTION   CYSTOSCOPY WITH INJECTION  08/28/2012   Procedure: CYSTOSCOPY WITH INJECTION;  Surgeon: Reece Packer, MD;  Location: [facility];  Service: Urology;  Laterality: N/A;  cysto and macroplastique    FOOT SURGERY Bilateral    "related to [place]"   [place]   "stomach"   I & D EXTREMITY Left 10/25/2018   Procedure: IRRIGATION AND DEBRIDEMENT GREAT TOE;  Surgeon: Meredith Pel, MD;  Location: [facility];  Service: Orthopedics;  Laterality: Left;   INCISION AND DRAINAGE Left 10/25/2018   GREAT TOE   INCISION AND DRAINAGE OF WOUND Right 04/11/2018   Procedure: IRRIGATION AND DEBRIDEMENT WOUND right foot and right ankle;  Surgeon: Evelina Bucy, DPM;  Location: [facility];  Service: Podiatry;  Laterality: Right;   KNEE ARTHROSCOPY Left    KNEE ARTHROSCOPY W/ ALLOGRAFT IMPANT Left    graft x 2   KNEE SURGERY     TOTAL 8 SURG'S   LAPAROSCOPIC SIGMOID COLECTOMY N/A  04/22/2015   Procedure: LAPAROSCOPIC HAND ASSISTED SIGMOID COLECTOMY;  Surgeon: Erroll Luna, MD;  Location: [facility];  Service: General;  Laterality: N/A;   LEFT HEART CATHETERIZATION WITH CORONARY ANGIOGRAM N/A 02/12/2014   Procedure: LEFT HEART CATHETERIZATION WITH CORONARY ANGIOGRAM;  Surgeon: Candee Furbish, MD;  Location: [facility];  Service: Cardiovascular;  Laterality: N/A;   No angiographically significant CAD; normal LVSF, LVEDP 45mmHg,  EF 55% (new finding ef 30% myoview 01-08-2014)   LUMBAR FUSION     MANDIBLE FRACTURE SURGERY     MULTIPLE LAPAROSCOPIES FOR ENDOMETRIOSIS     PUBOVAGINAL SLING  12/29/2011   Procedure: Gaynelle Arabian;  Surgeon: Reece Packer, MD;  Location: [facility];  Service: Urology;  Laterality: N/A;  cysto and sparc sling    PUBOVAGINAL SLING N/A 04/30/2013   Procedure: REMOVAL OF VAGINAL MESH;  Surgeon: Reece Packer, MD;  [facility];  Service: Urology;  Laterality: N/A;   RECONSTURCTION OF CONGENITAL UTERUS ANOMALY  1983   REPEAT RECONSTRUCTION ACL LEFT KNEE/ SCREWS REMOVED  03-28-2000   CADAVER GRAFT   TOTAL ABDOMINAL HYSTERECTOMY  1997   w/BSO   TRANSTHORACIC ECHOCARDIOGRAM  02/13/2014   ef 45%, hypokinesis base inferior and base inferolateral walls   UPPER GI ENDOSCOPY     WOUND DEBRIDEMENT Right 09/05/2016   Procedure: DEBRIDEMENT WOUND WITH GRAFT RIGHT FOOT;  Surgeon: Landis Martins, DPM;  Location: [facility];  Service: Podiatry;  Laterality: Right;   WRIST FRACTURE SURGERY      OB History   No obstetric history on file.      Home Medications    Prior to Admission medications   Medication Sig Start Date End Date Taking? Authorizing Provider  bisoprolol (ZEBETA) 5 MG tablet Take 1 tablet (5 mg total) by mouth daily. Patient not taking: Reported on 11/04/2021 01/29/21   Nahser, Wonda Cheng, MD  Continuous Blood Gluc Sensor (FREESTYLE LIBRE 14 DAY SENSOR) MISC 2 each by Does not apply route every 14 (fourteen) days. Use 1  sensor every 14 days to monitor blood sugar. 12/07/20   Elayne Snare, MD  Daridorexant HCl 50 MG TABS Take 50 mg by mouth  at bedtime.    [provider]  DULoxetine (CYMBALTA) 30 MG capsule Take 60 mg by mouth at bedtime. 01/09/21   [provider]  Eszopiclone 3 MG TABS Take 3 mg by mouth at bedtime.    [provider]  furosemide (LASIX) 20 MG tablet TAKE 1 TABLET (20 MG TOTAL) BY MOUTH DAILY. FOR LEG SWELLING Patient not taking: Reported on 11/04/2021 10/14/21   Elayne Snare, MD  gabapentin (NEURONTIN) 600 MG tablet Take 1,200 mg by mouth at bedtime.    [provider]  glucose blood (FREESTYLE TEST STRIPS) test strip USE TO CHECK BLOOD SUGARS DAILY. DX CODE E11.65 Patient not taking: Reported on 01/03/2022 08/09/18   Elayne Snare, MD  [facility] 80 MG CAPS Take 80 mg by mouth at bedtime. 05/01/18   [provider]  insulin glargine (LANTUS SOLOSTAR) 100 UNIT/ML Solostar Pen Injuect 10 units daily, if blood sugars are 150-200 only inject 6 units Patient taking differently: Inject 10 Units into the skin at bedtime. 09/10/21   Elayne Snare, MD  Insulin Pen Needle (BD PEN NEEDLE NANO 2ND GEN) 32G X 4 MM MISC 1 each by Does not apply route daily. Use to inject insulin daily. 01/01/20   Elayne Snare, MD  JARDIANCE 25 MG TABS tablet TAKE 1 TABLET BY MOUTH EVERY DAY Patient taking differently: Take 25 mg by mouth at bedtime. 10/25/21   Elayne Snare, MD  levothyroxine (SYNTHROID) 25 MCG tablet TAKE 1 TABLET BY MOUTH EVERY DAY BEFORE BREAKFAST Patient taking differently: Take 25 mcg by mouth at bedtime. 09/28/21   Elayne Snare, MD  lisinopril (ZESTRIL) 20 MG tablet Take 1 tablet (20 mg total) by mouth at bedtime. 01/07/22   Malonie Tatum, Derry Skill, PA-C  metFORMIN (GLUCOPHAGE-XR) 750 MG 24 hr tablet TAKE 1 TABLET (750 MG TOTAL) BY MOUTH 2 (TWO) TIMES DAILY. Patient taking differently: Take 1,500 mg by mouth at bedtime. 03/17/21   Elayne Snare, MD  Multiple Vitamins-Minerals (HAIR SKIN AND  NAILS FORMULA PO) Take 2 tablets by mouth in the morning and at bedtime. gummy    [provider]  omeprazole (PRILOSEC) 40 MG capsule Take 40-80 mg by mouth at bedtime.    [provider]  potassium chloride (KLOR-CON) 10 MEQ tablet Take 1 tablet (10 mEq total) by mouth 2 (two) times daily for 5 days. 01/07/22 01/12/22  Salinda Snedeker K, PA-C  VICTOZA 18 MG/3ML SOPN INJECT 1.8 MG UNDER THE SKIN ONCE DAILY Patient taking differently: Inject 1.8 mg into the skin at bedtime. 08/30/21   Elayne Snare, MD  VRAYLAR 6 MG CAPS Take 6 mg by mouth at bedtime.  05/31/18   [provider]    Family History Family History  Problem Relation Age of Onset   Hypertension Mother    Diabetes Mother    Cancer - Other Mother        lymphoma    Cancer - Other Father        lung, bladder cancer    Heart attack Father    Other Father        Lynch syndrome   Cancer - Other Brother        bladder cancer    Bladder Cancer Paternal Aunt        Lynch syndrome   Other Paternal Uncle        Lynch syndrome   Other Cousin        Lynch syndrome    Social History Social History   Tobacco  Use   Smoking status: Every Day    Types: E-cigarettes   Smokeless tobacco: Never   Tobacco comments:    per pt started smoking  age 56  Vaping Use   Vaping Use: Every day   Devices: "tried vaping for 1 yr; didn't work out"   Substance Use Topics   Alcohol use: Not Currently    Comment:  "special occasions only"   Drug use: Not Currently     Allergies   Latex, Sweetening enhancer [flavoring agent], Ropinirole, Aspartame and phenylalanine, Ibuprofen, and Trazodone and nefazodone   Review of Systems Review of Systems  Constitutional:  Negative for activity change, appetite change, fatigue and fever.  Eyes:  Negative for visual disturbance.  Respiratory:  Negative for cough and shortness of breath.   Cardiovascular:  Negative for chest pain.  Gastrointestinal:  Negative for abdominal pain,  diarrhea, nausea and vomiting.  Musculoskeletal:  Negative for arthralgias and myalgias.  Neurological:  Negative for dizziness, seizures, weakness, light-headedness and headaches.    Physical Exam Triage Vital Signs ED Triage Vitals  Enc Vitals Group     BP 01/07/22 1656 (!) 190/79     Pulse Rate 01/07/22 1656 77     Resp 01/07/22 1656 20     Temp 01/07/22 1656 98.3 F (36.8 C)     Temp src --      SpO2 01/07/22 1656 100 %     Weight --      Height --      Head Circumference --      Peak Flow --      Pain Score 01/07/22 1658 6     Pain Loc --      Pain Edu? --      Excl. in [place]? --    No data found.  Updated Vital Signs BP (!) 157/72    Pulse 67    Temp 98.3 F (36.8 C)    Resp 20    SpO2 98%   Visual Acuity Right Eye Distance:   Left Eye Distance:   Bilateral Distance:    Right Eye Near:   Left Eye Near:    Bilateral Near:     Physical Exam Vitals reviewed.  Constitutional:      General: She is awake. She is not in acute distress.    Appearance: Normal appearance. She is well-developed. She is not ill-appearing.     Comments: Appears stated age in no acute distress sitting comfortably in exam room  HENT:     Head: Normocephalic and atraumatic.     Comments: Fine tremor noted of mandible.    Mouth/Throat:     Pharynx: Uvula midline. No oropharyngeal exudate or posterior oropharyngeal erythema.  Cardiovascular:     Rate and Rhythm: Normal rate and regular rhythm.     Heart sounds: Normal heart sounds, S1 normal and S2 normal. No murmur heard. Pulmonary:     Effort: Pulmonary effort is normal.     Breath sounds: Normal breath sounds. No wheezing, rhonchi or rales.     Comments: Clear auscultation bilaterally Abdominal:     Palpations: Abdomen is soft.     Tenderness: There is no abdominal tenderness.  Psychiatric:        Behavior: Behavior is cooperative.     UC Treatments / Results  Labs (all labs ordered are listed, but only abnormal results are  displayed) Labs Reviewed  BASIC METABOLIC PANEL - Abnormal; Notable for the following components:  Result Value   Potassium 3.3 (*)    Glucose, Bld 209 (*)    Calcium 8.7 (*)    All other components within normal limits    EKG   Radiology No results found.  Procedures Procedures (including critical care time)  Medications Ordered in UC Medications - No data to display  Initial Impression / Assessment and Plan / UC Course  I have reviewed the triage vital signs and the nursing notes.  Pertinent labs & imaging results that were available during my care of the patient were reviewed by me and considered in my medical decision making (see chart for details).     Potassium recheck showed improved but not normalization of level with current potassium of 3.3.  Will extend potassium supplementation for an additional 5 days and new prescription was sent to the pharmacy.  Patient is not currently taking spironolactone and this was removed from her list.  We did discuss that there is a possibility of getting to which potassium with potassium supplement and lisinopril though this is unlikely given severity of hypokalemia during hospitalization.  Discussed that she will need to have this repeated in approximately 1 week and she can return to our clinic or see her PCP.  If she develops any nausea, vomiting, weakness, confusion she needs to be seen immediately.  Blood pressure is very elevated today.  This improved after she was sitting quietly but remains above goal.  Will increase lisinopril to 20 mg daily (previously prescribed 10).  Recommend she monitor her blood pressure at home.  If she develops any chest pain, shortness of breath, leg swelling, headaches, vision changes in the setting of high blood pressure she needs to go to the emergency room.  Recommended her blood pressure be rechecked either our clinic or primary care next week.  Strict return precautions given to which she expressed  understanding.  Final Clinical Impressions(s) / UC Diagnoses   Final diagnoses:  Hypokalemia  Elevated blood pressure reading in office with diagnosis of hypertension     Discharge Instructions      Your potassium is significantly improved but is not yet normal.  I have called in another 5 days for the potassium supplement.  Please take this as previously prescribed.  You need to have your blood rechecked in approximately 1 week which can be done either with our clinic or your primary care provider.  Your blood pressure was elevated today.  I have increased your lisinopril dose.  Please take this as prescribed.  Avoid caffeine, sodium, decongestants, NSAIDs (aspirin, ibuprofen/Advil, naproxen/Aleve).  Make sure you drink plenty fluid.  Monitor your blood pressure at home and if this is persistently above 140/90 you need to be reevaluated.  If you develop any chest pain, shortness of breath, headache, vision changes, dizziness you need to go to the emergency room.     ED Prescriptions     Medication Sig Dispense Auth. Provider   lisinopril (ZESTRIL) 20 MG tablet Take 1 tablet (20 mg total) by mouth at bedtime. 30 tablet Carlynn Leduc K, PA-C   potassium chloride (KLOR-CON) 10 MEQ tablet Take 1 tablet (10 mEq total) by mouth 2 (two) times daily for 5 days. 10 tablet Komal Stangelo, Derry Skill, PA-C      PDMP not reviewed this encounter.   Terrilee Croak, PA-C 01/07/22 1838

## 2022-01-07 NOTE — ED Triage Notes (Signed)
Pt was instructed to get potassium rechecked PLAN: was DC from Hospital on Monday and was treated for low potassium.

## 2022-01-07 NOTE — Discharge Instructions (Signed)
Your potassium is significantly improved but is not yet normal.  I have called in another 5 days for the potassium supplement.  Please take this as previously prescribed.  You need to have your blood rechecked in approximately 1 week which can be done either with our clinic or your primary care provider.  Your blood pressure was elevated today.  I have increased your lisinopril dose.  Please take this as prescribed.  Avoid caffeine, sodium, decongestants, NSAIDs (aspirin, ibuprofen/Advil, naproxen/Aleve).  Make sure you drink plenty fluid.  Monitor your blood pressure at home and if this is persistently above 140/90 you need to be reevaluated.  If you develop any chest pain, shortness of breath, headache, vision changes, dizziness you need to go to the emergency room.

## 2022-02-02 ENCOUNTER — Other Ambulatory Visit: Payer: Self-pay | Admitting: Endocrinology

## 2022-02-10 ENCOUNTER — Other Ambulatory Visit: Payer: Self-pay

## 2022-02-10 ENCOUNTER — Encounter (INDEPENDENT_AMBULATORY_CARE_PROVIDER_SITE_OTHER): Payer: Medicare Other | Admitting: Endocrinology

## 2022-02-10 DIAGNOSIS — Z794 Long term (current) use of insulin: Secondary | ICD-10-CM

## 2022-02-10 DIAGNOSIS — E1165 Type 2 diabetes mellitus with hyperglycemia: Secondary | ICD-10-CM | POA: Diagnosis not present

## 2022-02-10 LAB — HEMOGLOBIN A1C: Hgb A1c MFr Bld: 6.5 % (ref 4.6–6.5)

## 2022-02-10 LAB — COMPREHENSIVE METABOLIC PANEL
ALT: 10 U/L (ref 0–35)
AST: 17 U/L (ref 0–37)
Albumin: 4.2 g/dL (ref 3.5–5.2)
Alkaline Phosphatase: 52 U/L (ref 39–117)
BUN: 11 mg/dL (ref 6–23)
CO2: 35 mEq/L — ABNORMAL HIGH (ref 19–32)
Calcium: 9.6 mg/dL (ref 8.4–10.5)
Chloride: 98 mEq/L (ref 96–112)
Creatinine, Ser: 0.92 mg/dL (ref 0.40–1.20)
GFR: 69.5 mL/min (ref >60.00)
Glucose, Bld: 131 mg/dL — ABNORMAL HIGH (ref 70–99)
Potassium: 4.3 mEq/L (ref 3.5–5.1)
Sodium: 140 mEq/L (ref 135–145)
Total Bilirubin: 0.3 mg/dL (ref 0.2–1.2)
Total Protein: 6.4 g/dL (ref 6.0–8.3)

## 2022-02-10 LAB — LIPID PANEL
Cholesterol: 167 mg/dL (ref 0–200)
HDL: 43.3 mg/dL (ref >39.00)
Total CHOL/HDL Ratio: 4
Triglycerides: 489 mg/dL — ABNORMAL HIGH (ref 0.0–149.0)

## 2022-02-10 LAB — TSH: TSH: 2.77 u[IU]/mL (ref 0.35–5.50)

## 2022-02-10 LAB — LDL CHOLESTEROL, DIRECT: Direct LDL: 70 mg/dL

## 2022-02-10 NOTE — Progress Notes (Signed)
This encounter was created in error - please disregard.

## 2022-02-14 ENCOUNTER — Ambulatory Visit (INDEPENDENT_AMBULATORY_CARE_PROVIDER_SITE_OTHER): Payer: Medicare Other | Admitting: Endocrinology

## 2022-02-14 ENCOUNTER — Other Ambulatory Visit: Payer: Self-pay

## 2022-02-14 VITALS — BP 120/90 | HR 90 | Ht 65.0 in | Wt 194.8 lb

## 2022-02-14 DIAGNOSIS — E1165 Type 2 diabetes mellitus with hyperglycemia: Secondary | ICD-10-CM | POA: Diagnosis not present

## 2022-02-14 DIAGNOSIS — E876 Hypokalemia: Secondary | ICD-10-CM | POA: Diagnosis not present

## 2022-02-14 DIAGNOSIS — I10 Essential (primary) hypertension: Secondary | ICD-10-CM | POA: Diagnosis not present

## 2022-02-14 DIAGNOSIS — Z794 Long term (current) use of insulin: Secondary | ICD-10-CM

## 2022-02-14 MED ORDER — SPIRONOLACTONE 25 MG PO TABS
25.0000 mg | ORAL_TABLET | Freq: Every day | ORAL | 3 refills | Status: AC
Start: 2022-02-14 — End: ?

## 2022-02-14 MED ORDER — INSULIN LISPRO (1 UNIT DIAL) 100 UNIT/ML (KWIKPEN): PEN_INJECTOR | SUBCUTANEOUS | 1 refills | Status: DC

## 2022-02-14 NOTE — Patient Instructions (Addendum)
HUMALOG 5 UNITS BEFORE SUPPER DAILY  If sugar at bedtime > 200 go up to 7-8 Humalog at supper  Check blood sugars on waking up days daily  Also check blood sugars about 2 hours after meals and do this after different meals by rotation  Recommended blood sugar levels on waking up are 90-130 and about 2 hours after meal is 130-160  Please bring your blood sugar monitor to each visit, thank you  Stop potassium  Change Sensor today  Call for [place] 2 meter and new sensor

## 2022-02-14 NOTE — Progress Notes (Signed)
Patient ID: [name], female   DOB: May 04, 1966, 56 y.o.   MRN: [id number]   Reason for Appointment: Diabetes follow-up    History of Present Illness:   Diagnosis: Type 2 diabetes mellitus, date of diagnosis: 2004.  PAST history: She has been treated mostly with insulin since about a year after diagnosis. She has had difficulty with consistent compliance with diet and also compliance with self care including glucose monitoring over the years. She had been mostly treated with basal insulin. Also had been tried on mealtime insulin but she would be noncompliant with this. Was tried on Prandin for mealtime control but difficult to judge efficacy because of lack of postprandial monitoring. Was given Victoza to start in 2011 but did not follow up after this.She was tried on premixed insulin but this did not help her control, mostly because of noncompliance with the doses. She had been using the V- go pump and had better control initially and was better compliant with the daily routine and boluses. She has had frequent education visits also. She stopped using her V.-go pump in 6/15 because of discomfort at the site of application, mostly was using this on her abdomen Also did not improve with a trial of Victoza   RECENT history:    INSULIN dose: Glargine insulin 10 units in am.  Non-insulin hypoglycemic drugs: Metformin ER 750 mg, 2 tablets daily, Jardiance 25 mg daily at 8 pm, Victoza 1.8 mg daily  Her A1c is most recently 7.4 compared to 5.9   Current management, problems identified:  She has very little information on her freestyle libre because of lack of adequate monitoring Also today her blood sugar is reading falsely low with her sugar being 52 on her sensor and 125 on the fingerstick Previously blood sugars are not consistently high after meals and Victoza was started and increased to 1.8 She thinks she is taking her Lantus in the morning daily and also has been taking  her 25 mg Jardiance and metformin She does not think she is drinking excessive amounts of sweet drinks in the evening but diet is variable Weight has gone up  Interpretation of her 2-week CGM  Blood sugar data is very incomplete with only 37% of the time CGM being active mostly with the last week in the evenings  She appears to have fairly consistently high blood sugars in the evenings especially after 5-6 PM although not apparent this week  However her blood sugar was running falsely low all day today  Blood sugars appear to be relatively better in the early part of the day and near normal at least until noon  AVERAGE blood sugar after 10 PM = 220  PREVIOUS data:  CGM use % of time 63  2-week average/GV 198/36  Time in range 49      %  % Time Above 180 24  % Time above 250 26  % Time Below 70 1     PRE-MEAL Fasting Lunch Dinner Bedtime Overall  Glucose range:       Averages: 135  222 165 198   POST-MEAL PC Breakfast PC Lunch PC Dinner  Glucose range:     Averages:   241    Food preferences: eating Mostly 1 meal per day around 6-8 pm, variable intake, sandwiches at times or otherwise snacks,  Physical activity: exercise: Minimal.  Certified Diabetes Educator visit: Most recent:, 2/14.  Nutrition counseling: 10/20/2020   Wt Readings from Last 3 Encounters:  02/14/22 194 lb 12.8 oz (88.4 kg)  11/04/21 188 lb 9.6 oz (85.5 kg)  09/20/21 186 lb 12.8 oz (84.7 kg)   DM labs:   Lab Results  Component Value Date   HGBA1C 6.5 02/10/2022   HGBA1C 7.4 (A) 09/20/2021   HGBA1C 5.9 12/22/2020   Lab Results  Component Value Date   MICROALBUR 4.8 (H) 09/20/2021   LDLCALC 51 12/22/2020   CREATININE 0.92 02/10/2022     Other active problems: See review of systems    Allergies as of 02/14/2022       Reactions   Latex Itching, Rash, Other (See Comments)   Pt states she cannot use condoms - cause an infection.  Use of latex on skin is okay for short period of time.  Tape  causes rash   Sweetening Enhancer [flavoring Agent] Nausea And Vomiting, Other (See Comments)   HEADACHES   Ropinirole    Pt stated, "I am getting cramps in my legs - especially at night"   Aspartame And Phenylalanine Nausea And Vomiting   HEADACHES   Ibuprofen Other (See Comments)   HEADACHES   Trazodone And Nefazodone Other (See Comments)   Hallucinations        Medication List        Accurate as of February 14, 2022 11:59 PM. If you have any questions, ask your nurse or doctor.          STOP taking these medications    potassium chloride 10 MEQ tablet Commonly known as: KLOR-CON Stopped by: Elayne Snare, MD       TAKE these medications    BD Pen Needle Nano 2nd Gen 32G X 4 MM Misc Generic drug: Insulin Pen Needle 1 each by Does not apply route daily. Use to inject insulin daily.   bisoprolol 5 MG tablet Commonly known as: ZEBETA Take 1 tablet (5 mg total) by mouth daily.   Daridorexant HCl 50 MG Tabs Take 50 mg by mouth at bedtime.   DULoxetine 30 MG capsule Commonly known as: CYMBALTA Take 60 mg by mouth at bedtime.   Eszopiclone 3 MG Tabs Take 3 mg by mouth at bedtime.   FreeStyle Libre 14 Day Sensor Misc 2 each by Does not apply route every 14 (fourteen) days. Use 1 sensor every 14 days to monitor blood sugar.   furosemide 20 MG tablet Commonly known as: LASIX TAKE 1 TABLET (20 MG TOTAL) BY MOUTH DAILY. FOR LEG SWELLING   gabapentin 600 MG tablet Commonly known as: NEURONTIN Take 1,200 mg by mouth at bedtime.   glucose blood test strip Commonly known as: FREESTYLE TEST STRIPS USE TO CHECK BLOOD SUGARS DAILY. DX CODE E11.65   HAIR SKIN AND NAILS FORMULA PO Take 2 tablets by mouth in the morning and at bedtime. gummy   Ingrezza 80 MG capsule Generic drug: valbenazine Take 80 mg by mouth at bedtime.   insulin lispro 100 UNIT/ML KwikPen Commonly known as: HumaLOG KwikPen Upto 10 U tid Started by: Elayne Snare, MD   Jardiance 25 MG Tabs  tablet Generic drug: empagliflozin TAKE 1 TABLET BY MOUTH EVERY DAY What changed:  how much to take when to take this   Lantus SoloStar 100 UNIT/ML Solostar Pen Generic drug: insulin glargine Injuect 10 units daily, if blood sugars are 150-200 only inject 6 units What changed:  how much to take how to take this when to take this additional instructions   levothyroxine 25 MCG tablet Commonly known as: SYNTHROID TAKE 1  TABLET BY MOUTH EVERY DAY BEFORE BREAKFAST What changed: See the new instructions.   lisinopril 20 MG tablet Commonly known as: ZESTRIL Take 1 tablet (20 mg total) by mouth at bedtime.   metFORMIN 750 MG 24 hr tablet Commonly known as: GLUCOPHAGE-XR TAKE 1 TABLET (750 MG TOTAL) BY MOUTH 2 (TWO) TIMES DAILY. What changed:  how much to take when to take this   omeprazole 40 MG capsule Commonly known as: PRILOSEC Take 40-80 mg by mouth at bedtime.   spironolactone 25 MG tablet Commonly known as: ALDACTONE Take 1 tablet (25 mg total) by mouth daily. Started by: Elayne Snare, MD   Victoza 18 MG/3ML Sopn Generic drug: liraglutide INJECT 1.8 MG UNDER THE SKIN ONCE DAILY   Vraylar 6 MG Caps Generic drug: Cariprazine HCl Take 6 mg by mouth at bedtime.        Allergies:  Allergies  Allergen Reactions   Latex Itching, Rash and Other (See Comments)    Pt states she cannot use condoms - cause an infection.  Use of latex on skin is okay for short period of time.  Tape causes rash   Sweetening Enhancer [Flavoring Agent] Nausea And Vomiting and Other (See Comments)    HEADACHES   Ropinirole     Pt stated, "I am getting cramps in my legs - especially at night"   Aspartame And Phenylalanine Nausea And Vomiting    HEADACHES   Ibuprofen Other (See Comments)    HEADACHES   Trazodone And Nefazodone Other (See Comments)    Hallucinations     Past Medical History:  Diagnosis Date   Anxiety    Arthritis    Bipolar disorder ([place])    Broken jaw (HCC)     Broken wrist    Chronic back pain    Claudication ([place])    a. 12/2013 ABI's: R 0.97, L 0.94.   Colon cancer ([facility])    COPD (chronic obstructive pulmonary disease) (HCC)    Depression    Diabetic Charcot's foot (HCC)    Diabetic foot ulcer ([place])    chronic left foot ulcer , great toe/  hx recurrent foot ulcer bilaterally   DJD (degenerative joint disease)    Edema of both lower extremities    Episode of memory loss    Family history of bladder cancer    Family history of colon cancer    Family history of kidney cancer    GERD (gastroesophageal reflux disease)    Headache(784.0)    Hiatal hernia    History of colon cancer, stage I dx 02-26-2015--- oncologist-- dr [name]--- per last note no recurrence   05-16-2015 s/p  Laparoscopic sigmoid colectomy w/ node bx's (negative nodes per path)  Stage I (pT1,N0,M0) Grade 2   History of CVA with residual deficit 02-12-2014  post op cardiac cath.   per MRI multiple small strokes post cardiac cath. --  residual mild memory loss   History of methicillin resistant staphylococcus aureus (MRSA)    Hypercholesterolemia    Hypertension    Insomnia    Insulin dependent type 2 diabetes mellitus, uncontrolled dx 2004   endocrinologist-  dr Dwyane Dee-- last A1c 8.8 in Aug2018:  pt is noncompliant w/ diet, states does note eat breakfast , her first main meal in afternoon   Neurogenic bladder    Neuropathy, diabetic (HCC)    hands and feet   OSA (obstructive sleep apnea)    cpap intolerant   Personality disorder (HCC)    Restless leg  syndrome    SOB (shortness of breath) on exertion    Stroke [facility])    SUI (stress urinary incontinence, female) S/P SLING 12-29-2011    Past Surgical History:  Procedure Laterality Date   AMPUTATION Right 04/13/2018   Procedure: RIGHT BELOW KNEE AMPUTATION;  Surgeon: Newt Minion, MD;  Location: [place];  Service: Orthopedics;  Laterality: Right;   AMPUTATION Left 10/26/2018   Procedure: LEFT FOOT FIRST RAY AMPUTATION;   Surgeon: Newt Minion, MD;  Location: [place];  Service: Orthopedics;  Laterality: Left;   AMPUTATION TOE Left 05/10/2019   Procedure: AMPUTATION 2nd left TOE WITH METATARSAL;  Surgeon: Landis Martins, DPM;  Location: [place];  Service: Podiatry;  Laterality: Left;   ANTERIOR CERVICAL DECOMP/DISCECTOMY FUSION  2703   C5 - 7   APPLICATION OF WOUND VAC Left 10/26/2018   Procedure: APPLICATION OF WOUND VAC;  Surgeon: Newt Minion, MD;  Location: [place];  Service: Orthopedics;  Laterality: Left;   BACK SURGERY     CARDIAC CATHETERIZATION  05-22-2008   DR SKAINS   NO SIGNIFECANT CAD/ NORMAL LV/  EF 65%/  NO WALL MOTION ABNORMALITIES   CARPAL TUNNEL RELEASE Right 04-25-2013   CATARACT EXTRACTION Right 10/24/2018   CESAREAN SECTION  1989; 1992   COLON SURGERY     COLONOSCOPY     CYSTO N/A 04/30/2013   Procedure: CYSTOSCOPY;  Surgeon: Reece Packer, MD;  Location[facility];  Service: Urology;  Laterality: N/A;   CYSTOSCOPY MACROPLASTIQUE IMPLANT N/A 02/06/2018   Procedure: CYSTOSCOPY MACROPLASTIQUE IMPLANT;  Surgeon: Bjorn Loser, MD;  Location[facility];  Service: Urology;  Laterality: N/A;   CYSTOSCOPY WITH INJECTION  05/04/2012   Procedure: CYSTOSCOPY WITH INJECTION;  Surgeon: Reece Packer, MD;  Location: [place];  Service: Urology;  Laterality: N/A;  MACROPLASTIQUE INJECTION   CYSTOSCOPY WITH INJECTION  08/28/2012   Procedure: CYSTOSCOPY WITH INJECTION;  Surgeon: Reece Packer, MD;  Location: [place];  Service: Urology;  Laterality: N/A;  cysto and macroplastique    FOOT SURGERY Bilateral    "related to [place]"   [place]   "stomach"   I & D EXTREMITY Left 10/25/2018   Procedure: IRRIGATION AND DEBRIDEMENT GREAT TOE;  Surgeon: Meredith Pel, MD;  Location: [place];  Service: Orthopedics;  Laterality: Left;   INCISION AND DRAINAGE Left 10/25/2018   GREAT TOE   INCISION AND DRAINAGE OF WOUND Right 04/11/2018    Procedure: IRRIGATION AND DEBRIDEMENT WOUND right foot and right ankle;  Surgeon: Evelina Bucy, DPM;  Location: [facility];  Service: Podiatry;  Laterality: Right;   KNEE ARTHROSCOPY Left    KNEE ARTHROSCOPY W/ ALLOGRAFT IMPANT Left    graft x 2   KNEE SURGERY     TOTAL 8 SURG'S   LAPAROSCOPIC SIGMOID COLECTOMY N/A 04/22/2015   Procedure: LAPAROSCOPIC HAND ASSISTED SIGMOID COLECTOMY;  Surgeon: Erroll Luna, MD;  Location: [place];  Service: General;  Laterality: N/A;   LEFT HEART CATHETERIZATION WITH CORONARY ANGIOGRAM N/A 02/12/2014   Procedure: LEFT HEART CATHETERIZATION WITH CORONARY ANGIOGRAM;  Surgeon: Candee Furbish, MD;  [facility];  Service: Cardiovascular;  Laterality: N/A;   No angiographically significant CAD; normal LVSF, LVEDP 56mmHg,  EF 55% (new finding ef 30% myoview 01-08-2014)   LUMBAR FUSION     MANDIBLE FRACTURE SURGERY     MULTIPLE LAPAROSCOPIES FOR ENDOMETRIOSIS     PUBOVAGINAL SLING  12/29/2011  Procedure: Gaynelle Arabian;  Surgeon: Reece Packer, MD;  Location: [facility];  Service: Urology;  Laterality: N/A;  cysto and sparc sling    PUBOVAGINAL SLING N/A 04/30/2013   Procedure: REMOVAL OF VAGINAL MESH;  Surgeon: Reece Packer, MD;  [facility];  Service: Urology;  Laterality: N/A;   RECONSTURCTION OF CONGENITAL UTERUS ANOMALY  1983   REPEAT RECONSTRUCTION ACL LEFT KNEE/ SCREWS REMOVED  03-28-2000   CADAVER GRAFT   TOTAL ABDOMINAL HYSTERECTOMY  1997   w/BSO   TRANSTHORACIC ECHOCARDIOGRAM  02/13/2014   ef 45%, hypokinesis base inferior and base inferolateral walls   UPPER GI ENDOSCOPY     WOUND DEBRIDEMENT Right 09/05/2016   Procedure: DEBRIDEMENT WOUND WITH GRAFT RIGHT FOOT;  Surgeon: Landis Martins, DPM;  Location: [place];  Service: Podiatry;  Laterality: Right;   WRIST FRACTURE SURGERY      Family History  Problem Relation Age of Onset   Hypertension Mother    Diabetes Mother    Cancer - Other Mother        lymphoma     Cancer - Other Father        lung, bladder cancer    Heart attack Father    Other Father        Lynch syndrome   Cancer - Other Brother        bladder cancer    Bladder Cancer Paternal Aunt        Lynch syndrome   Other Paternal Uncle        Lynch syndrome   Other Cousin        Lynch syndrome    Social History:  reports that she has been smoking e-cigarettes. She has never used smokeless tobacco. She reports that she does not currently use alcohol. She reports that she does not currently use drugs.  Review of Systems -   HYPOTHYROIDISM:  She had routine lab work done by her PCP and her TSH was 8.5 She was started on levothyroxine 25 mcg daily Follow-up TSH normal  Lab Results  Component Value Date   TSH 2.77 02/10/2022   TSH 1.34 09/20/2021   TSH 1.30 12/22/2020   FREET4 0.61 09/20/2021   FREET4 0.78 12/22/2020   FREET4 0.64 10/20/2020    She has history of high triglycerides treated with fenofibrate  However she does not appear to be taking fenofibrate now  Lab Results  Component Value Date   CHOL 167 02/10/2022   HDL 43.30 02/10/2022   LDLCALC 51 12/22/2020   LDLDIRECT 70.0 02/10/2022   TRIG (H) 02/10/2022    489.0 Triglyceride is over 400; calculations on Lipids are invalid.   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 Treated with 20 mg lisinopril now, appears not to be taking the previously prescribed bisoprolol 5 mg We will take  Lasix only occasionally for edema Previously had been on spironolactone but is not taking it and potassium has been variable  Also on Jardiance  BP Readings from Last 3 Encounters:  02/14/22 120/90  01/07/22 (!) 157/72  01/03/22 (!) 168/94   She has had hypokalemia at times    Examination:   BP 120/90    Pulse 90    Ht 5\' 5"  (1.651 m)    Wt 194 lb 12.8 oz (88.4 kg)    SpO2 98%    BMI 32.42 kg/m   Body mass index is 32.42 kg/m.     Assesment/Plan:   DIABETES  type 2, with mild obesity, insulin requiring  See history of present illness for detailed discussion of current diabetes management, blood sugar patterns and problems identified  She is on Lantus, Victoza, Jardiance and metformin  Her A1c is 6.5 vs 7.4  However her freestyle Elenor Legato has only incomplete information  More recently she appears to be having significant postprandial hyperglycemia after main meals in the evening despite Victoza  Weight has come back up further  For now will need to start adding mealtime NovoLog at suppertime and start with 5 units and go up to 7 or 8 units if blood sugars after eating are still over 200 More consistent monitoring of blood sugar at all times She will make sure she is switching to the freestyle libre version 2 by calling her supplier She will change her sensor today as it is reading falsely low  Mild hypothyroidism: Continue 25 mcg levothyroxine   HYPERTENSION: Not consistently controlled  We will add 25 mg Aldactone to her lisinopril and stop any potassium supplements      Patient Instructions  HUMALOG 5 UNITS BEFORE SUPPER DAILY  If sugar at bedtime > 200 go up to 7-8 Humalog at supper  Check blood sugars on waking up days daily  Also check blood sugars about 2 hours after meals and do this after different meals by rotation  Recommended blood sugar levels on waking up are 90-130 and about 2 hours after meal is 130-160  Please bring your blood sugar monitor  to each visit, thank you  Stop potassium  Change Sensor today  Call for [facility] 2 meter and new sensor          Essance Gatti 02/15/2022, 1:47 PM

## 2022-02-15 ENCOUNTER — Encounter: Payer: Self-pay | Admitting: Endocrinology

## 2022-02-15 ENCOUNTER — Other Ambulatory Visit: Payer: Self-pay | Admitting: Endocrinology

## 2022-02-15 MED ORDER — FENOFIBRATE 145 MG PO TABS
145.0000 mg | ORAL_TABLET | Freq: Every day | ORAL | 2 refills | Status: AC
Start: 2022-02-15 — End: ?

## 2022-02-16 ENCOUNTER — Other Ambulatory Visit: Payer: Self-pay

## 2022-02-16 DIAGNOSIS — Z794 Long term (current) use of insulin: Secondary | ICD-10-CM

## 2022-02-16 DIAGNOSIS — E1165 Type 2 diabetes mellitus with hyperglycemia: Secondary | ICD-10-CM

## 2022-02-16 MED ORDER — FREESTYLE LIBRE 2 SENSOR MISC: 3 refills | Status: DC

## 2022-02-16 MED ORDER — FREESTYLE LIBRE 2 READER DEVI: 0 refills | Status: DC

## 2022-02-18 ENCOUNTER — Ambulatory Visit: Payer: Medicare Other | Admitting: Sports Medicine

## 2022-03-01 ENCOUNTER — Ambulatory Visit: Payer: Medicare Other | Admitting: Sports Medicine

## 2022-03-03 ENCOUNTER — Other Ambulatory Visit: Payer: Self-pay

## 2022-03-03 DIAGNOSIS — Z794 Long term (current) use of insulin: Secondary | ICD-10-CM

## 2022-03-03 MED ORDER — FREESTYLE LIBRE 2 SENSOR MISC: 3 refills | Status: DC

## 2022-03-04 ENCOUNTER — Telehealth: Payer: Self-pay

## 2022-03-04 NOTE — Telephone Encounter (Signed)
Pt left voicemail about needing sensors for Freestyle [place] 2 sent to [employer] [facility]. Rx sent over for patient. Called left vm to let patient know ?

## 2022-03-09 ENCOUNTER — Encounter: Payer: Self-pay | Admitting: Sports Medicine

## 2022-03-09 ENCOUNTER — Other Ambulatory Visit: Payer: Self-pay

## 2022-03-09 ENCOUNTER — Other Ambulatory Visit: Payer: Self-pay | Admitting: *Deleted

## 2022-03-09 ENCOUNTER — Ambulatory Visit (INDEPENDENT_AMBULATORY_CARE_PROVIDER_SITE_OTHER): Payer: 59 | Admitting: Sports Medicine

## 2022-03-09 DIAGNOSIS — B351 Tinea unguium: Secondary | ICD-10-CM | POA: Diagnosis not present

## 2022-03-09 DIAGNOSIS — M79676 Pain in unspecified toe(s): Secondary | ICD-10-CM | POA: Diagnosis not present

## 2022-03-09 DIAGNOSIS — Z89422 Acquired absence of other left toe(s): Secondary | ICD-10-CM

## 2022-03-09 DIAGNOSIS — E1142 Type 2 diabetes mellitus with diabetic polyneuropathy: Secondary | ICD-10-CM | POA: Diagnosis not present

## 2022-03-09 DIAGNOSIS — M79609 Pain in unspecified limb: Secondary | ICD-10-CM | POA: Diagnosis not present

## 2022-03-09 DIAGNOSIS — Z89511 Acquired absence of right leg below knee: Secondary | ICD-10-CM

## 2022-03-09 NOTE — Progress Notes (Signed)
Subjective: ?[name] is a 56 y.o. female patient seen today in office for nail care on the left foot and for discussion about her shoes that she has been waiting on for 2 years.  Patient states that she has switched to Dr Emelia Loron at [facility] in [place] however upon looking up this provider it seems as if he is a Designer, jewellery since patient could not recall her providers name completely.  ? ?Patient Active Problem List  ? Diagnosis Date Noted  ? Diarrhea 01/03/2022  ? Hypokalemia 01/03/2022  ? Palpitations 01/29/2021  ? Nicotine dependence 03/06/2020  ? Genetic testing 03/12/2019  ? Family history of colon cancer   ? Family history of bladder cancer   ? Family history of kidney cancer   ? Postmenopausal bleeding 11/15/2018  ? Dog bite of foot, sequela   ? Left foot infection 10/25/2018  ? Gangrenous disorder ([place]) 09/17/2018  ? Cellulitis of left upper extremity   ? Poorly controlled diabetes mellitus ([place])   ? UTI (urinary tract infection) 05/23/2018  ? Acute metabolic encephalopathy 19/41/7408  ? GERD (gastroesophageal reflux disease) 05/09/2018  ? Tobacco abuse 05/09/2018  ? Polypharmacy 05/09/2018  ? Restless leg syndrome 04/27/2018  ? Anxiety 04/27/2018  ? Hyponatremia 04/26/2018  ? Noncompliance with dietary restriction 04/26/2018  ? Postoperative cellulitis of surgical wound 04/26/2018  ? At high risk for falls 04/24/2018  ? Noncompliance with safety precautions 04/24/2018  ? Leukocytosis 04/24/2018  ? 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 PAD (peripheral artery disease) ([place])   ? Poorly controlled type 2 diabetes mellitus ([place])   ? Subacute osteomyelitis, right ankle and foot ([place])   ? Major depressive disorder, recurrent episode ([place]) 04/11/2018  ? Diabetic ulcer of right midfoot associated with type 2 diabetes mellitus, with necrosis of bone ([place])   ? Abscess of ankle   ? Infectious synovitis   ? Diabetic foot infection ([place])   ? Sepsis ([place]) 04/09/2018  ? Melanocytic nevus  02/28/2018  ? Lesion of labia 09/27/2017  ? Vaginitis and vulvovaginitis 02/19/2016  ? Cancer of sigmoid colon ([place]) 03/09/2015  ? History of stroke within last year 02/12/2015  ? OSA (obstructive sleep apnea) 07/04/2014  ? Migraine without aura, with intractable migraine, so stated, without mention of status migrainosus 03/26/2014  ? Peripheral neuropathy 03/03/2014  ? Abnormal stress test 02/12/2014  ? CVA (cerebral infarction) 02/12/2014  ? Chest pain   ? Abnormal heart rhythm   ? Edema   ? Hypertension   ? Hyperlipemia   ? Hypercholesterolemia   ? Uncontrolled type 2 diabetes mellitus with hyperglycemia, with long-term current use of insulin ([place]) 07/22/2013  ? Diabetic neuropathy with neurologic complication ([place]) 14/48/1856  ? Diabetic polyneuropathy ([place]) 07/22/2013  ? ? ?Current Outpatient Medications on File Prior to Visit  ?Medication Sig Dispense Refill  ? bisoprolol (ZEBETA) 5 MG tablet Take 1 tablet (5 mg total) by mouth daily. (Patient not taking: Reported on 11/04/2021) 90 tablet 3  ? Continuous Blood Gluc Receiver (FREESTYLE LIBRE 2 READER) DEVI Use to check blood sugar daily 1 each 0  ? Continuous Blood Gluc Sensor (FREESTYLE LIBRE 2 SENSOR) MISC Use to check blood sugar daily 2 each 3  ? Daridorexant HCl 50 MG TABS Take 50 mg by mouth at bedtime.    ? DULoxetine (CYMBALTA) 30 MG capsule Take 60 mg by mouth at bedtime.    ? Eszopiclone 3 MG TABS Take 3 mg by mouth at bedtime.    ?  fenofibrate (TRICOR) 145 MG tablet Take 1 tablet (145 mg total) by mouth daily. 90 tablet 2  ? furosemide (LASIX) 20 MG tablet TAKE 1 TABLET (20 MG TOTAL) BY MOUTH DAILY. FOR LEG SWELLING (Patient not taking: Reported on 11/04/2021) 30 tablet 1  ? gabapentin (NEURONTIN) 600 MG tablet Take 1,200 mg by mouth at bedtime.    ? glucose blood (FREESTYLE TEST STRIPS) test strip USE TO CHECK BLOOD SUGARS DAILY. DX CODE E11.65 (Patient not taking: Reported on 01/03/2022) 50 each 3  ? INGREZZA 80 MG CAPS Take 80 mg by mouth at  bedtime.    ? insulin glargine (LANTUS SOLOSTAR) 100 UNIT/ML Solostar Pen Injuect 10 units daily, if blood sugars are 150-200 only inject 6 units (Patient taking differently: Inject 10 Units into the skin at bedtime.) 9 mL 3  ? insulin lispro (HUMALOG KWIKPEN) 100 UNIT/ML KwikPen Upto 10 U tid 15 mL 1  ? Insulin Pen Needle (BD PEN NEEDLE NANO 2ND GEN) 32G X 4 MM MISC 1 each by Does not apply route daily. Use to inject insulin daily. 100 each 1  ? JARDIANCE 25 MG TABS tablet TAKE 1 TABLET BY MOUTH EVERY DAY (Patient taking differently: Take 25 mg by mouth at bedtime.) 30 tablet 5  ? levothyroxine (SYNTHROID) 25 MCG tablet TAKE 1 TABLET BY MOUTH EVERY DAY BEFORE BREAKFAST (Patient taking differently: Take 25 mcg by mouth at bedtime.) 90 tablet 1  ? lisinopril (ZESTRIL) 20 MG tablet Take 1 tablet (20 mg total) by mouth at bedtime. 30 tablet 1  ? metFORMIN (GLUCOPHAGE-XR) 750 MG 24 hr tablet TAKE 1 TABLET (750 MG TOTAL) BY MOUTH 2 (TWO) TIMES DAILY. (Patient taking differently: Take 1,500 mg by mouth at bedtime.) 180 tablet 1  ? Multiple Vitamins-Minerals (HAIR SKIN AND NAILS FORMULA PO) Take 2 tablets by mouth in the morning and at bedtime. gummy    ? omeprazole (PRILOSEC) 40 MG capsule Take 40-80 mg by mouth at bedtime.    ? spironolactone (ALDACTONE) 25 MG tablet Take 1 tablet (25 mg total) by mouth daily. 90 tablet 3  ? VICTOZA 18 MG/3ML SOPN INJECT 1.8 MG UNDER THE SKIN ONCE DAILY 9 mL 2  ? VRAYLAR 6 MG CAPS Take 6 mg by mouth at bedtime.   1  ? ?No current facility-administered medications on file prior to visit.  ? ? ?Allergies  ?Allergen Reactions  ? Latex Itching, Rash and Other (See Comments)  ?  Pt states she cannot use condoms - cause an infection.  Use of latex on skin is okay for short period of time.  Tape causes rash  ? Sweetening Enhancer [Flavoring Agent] Nausea And Vomiting and Other (See Comments)  ?  HEADACHES  ? Ropinirole   ?  Pt stated, "I am getting cramps in my legs - especially at night"  ?  Aspartame And Phenylalanine Nausea And Vomiting  ?  HEADACHES  ? Ibuprofen Other (See Comments)  ?  HEADACHES  ? Trazodone And Nefazodone Other (See Comments)  ?  Hallucinations ?  ? ? ?Objective: ?There were no vitals filed for this visit. ? ?General: No acute distress, AAOx3  ?Left foot.  Nails x3 midly elongated at toes 3-5, no warmth, no drainage, no other signs of infection noted, Capillary fill time <3 seconds in all remaining digits, gross sensation absent via light touch to left foot.  No pain at the calf.  Patient is status post left first and second toe digital amputations and status post right  BKA. ? ?Assessment and Plan:  ?Problem List Items Addressed This Visit   ? ?  ? Endocrine  ? Diabetic polyneuropathy ([place])  ? ?Other Visit Diagnoses   ? ? Pain due to onychomycosis of nail    -  Primary  ? S/P amputation of lesser toe, left ([place])      ? Status post below knee amputation, right ([place])      ? ?  ?  ? ?-Patient seen and evaluated ?-Mechanically debrided nails x3 on the left foot using a sterile nipper nipper without incident ?-At no charge dispensed a new surgical shoe for the left until patient can be fitted for custom insole and shoe ; patient to schedule a follow-up appointment with Aaron Edelman for this and also recommend a shoe for her prosthetic since her current shoe is falling apart ?-Return to office as scheduled for new shoe measurements or sooner if problems or issues arise. ? ?Landis Martins, DPM ?

## 2022-03-15 ENCOUNTER — Other Ambulatory Visit: Payer: 59

## 2022-03-15 ENCOUNTER — Telehealth: Payer: Self-pay

## 2022-03-15 DIAGNOSIS — E1165 Type 2 diabetes mellitus with hyperglycemia: Secondary | ICD-10-CM

## 2022-03-15 MED ORDER — VICTOZA 18 MG/3ML ~~LOC~~ SOPN: PEN_INJECTOR | SUBCUTANEOUS | 2 refills | Status: AC

## 2022-03-15 NOTE — Telephone Encounter (Signed)
Patient called in and wants to know if she is supposed to be taking, Humalog, lantus, and victoza? Please advise ?

## 2022-03-17 ENCOUNTER — Ambulatory Visit: Payer: Medicare Other | Admitting: Endocrinology

## 2022-03-22 ENCOUNTER — Other Ambulatory Visit: Payer: Self-pay | Admitting: Endocrinology

## 2022-04-06 ENCOUNTER — Ambulatory Visit
Admission: EM | Admit: 2022-04-06 | Discharge: 2022-04-06 | Disposition: A | Discharge status: Home / Self Care | Payer: 59 | Attending: Internal Medicine | Admitting: Internal Medicine

## 2022-04-06 DIAGNOSIS — H6123 Impacted cerumen, bilateral: Secondary | ICD-10-CM

## 2022-04-06 MED ORDER — OFLOXACIN 0.3 % OT SOLN: 10.0000 [drp] | Freq: Every day | OTIC | 0 refills | Status: AC

## 2022-04-06 NOTE — Discharge Instructions (Signed)
Your ears have been cleaned out.  Please follow-up if symptoms persist or worsen. ?

## 2022-04-06 NOTE — ED Provider Notes (Signed)
?[name] ? ? ? ?CSN: [id number] ?Arrival date & time: 04/06/22  1505 ? ? ?  ? ?History   ?Chief Complaint ?Chief Complaint  ?Patient presents with  ? Otalgia  ? ? ?HPI ?[name] is a 56 y.o. female.  ? ?Patient presents with bilateral ear discomfort that has been present for approximately 2 weeks.  Denies trauma, foreign body, drainage, decreased hearing from the ears.  Patient reports that she was seen by a nurse at [facility] and was told that a doctor was going to prescribe her some eardrops but reports that she never received them at the pharmacy.  Denies any associated upper respiratory symptoms or fever. ? ? ?Otalgia ? ?Past Medical History:  ?Diagnosis Date  ? Anxiety   ? Arthritis   ? Bipolar disorder ([name])   ? Broken jaw ([name])   ? Broken wrist   ? Chronic back pain   ? Claudication [name])   ? a. 12/2013 ABI's: R 0.97, L 0.94.  ? Colon cancer ([place])   ? COPD (chronic obstructive pulmonary disease) ([place])   ? Depression   ? Diabetic Charcot's foot ([name])   ? Diabetic foot ulcer ([name])   ? chronic left foot ulcer , great toe/  hx recurrent foot ulcer bilaterally  ? DJD (degenerative joint disease)   ? Edema of both lower extremities   ? Episode of memory loss   ? Family history of bladder cancer   ? Family history of colon cancer   ? Family history of kidney cancer   ? GERD (gastroesophageal reflux disease)   ? Headache(784.0)   ? Hiatal hernia   ? History of colon cancer, stage I dx 02-26-2015--- oncologist-- dr [name]--- per last note no recurrence  ? 05-16-2015 s/p  Laparoscopic sigmoid colectomy w/ node bx's (negative nodes per path)  Stage I (pT1,N0,M0) Grade 2  ? 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  ? per MRI multiple small strokes post cardiac cath. --  residual mild memory loss  ? History of methicillin resistant staphylococcus aureus (MRSA)   ? Hypercholesterolemia   ? Hypertension   ? Insomnia   ? Insulin dependent type 2 diabetes mellitus, uncontrolled dx  2004  ? endocrinologist-  dr Dwyane Dee-- last A1c 8.8 in Aug2018:  pt is noncompliant w/ diet, states does note eat breakfast , her first main meal in afternoon  ? Neurogenic bladder   ? Neuropathy, diabetic ([name])   ? hands and feet  ? OSA (obstructive sleep apnea)   ? cpap intolerant  ? Personality disorder ([name])   ? Restless leg syndrome   ? SOB (shortness of breath) on exertion   ? Stroke [facility])   ? SUI (stress urinary incontinence, female) S/P SLING 12-29-2011  ? ? ?Patient Active Problem List  ? Diagnosis Date Noted  ? Diarrhea 01/03/2022  ? Hypokalemia 01/03/2022  ? Palpitations 01/29/2021  ? Nicotine dependence 03/06/2020  ? Genetic testing 03/12/2019  ? Family history of colon cancer   ? Family history of bladder cancer   ? Family history of kidney cancer   ? Postmenopausal bleeding 11/15/2018  ? Dog bite of foot, sequela   ? Left foot infection 10/25/2018  ? Gangrenous disorder ([place]) 09/17/2018  ? Cellulitis of left upper extremity   ? Poorly controlled diabetes mellitus ([place])   ? UTI (urinary tract infection) 05/23/2018  ? Acute metabolic encephalopathy 67/20/9470  ? GERD (gastroesophageal reflux disease) 05/09/2018  ? Tobacco abuse  05/09/2018  ? Polypharmacy 05/09/2018  ? Restless leg syndrome 04/27/2018  ? Anxiety 04/27/2018  ? Hyponatremia 04/26/2018  ? Noncompliance with dietary restriction 04/26/2018  ? Postoperative cellulitis of surgical wound 04/26/2018  ? At high risk for [name] 04/24/2018  ? Noncompliance with safety precautions 04/24/2018  ? Leukocytosis 04/24/2018  ? 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 PAD (peripheral artery disease) ([name])   ? Poorly controlled type 2 diabetes mellitus ([place])   ? Subacute osteomyelitis, right ankle and foot ([name])   ? Major depressive disorder, recurrent episode ([name]) 04/11/2018  ? Diabetic ulcer of right midfoot associated with type 2 diabetes mellitus, with necrosis of bone ([name])   ? Abscess of ankle   ? Infectious synovitis   ? Diabetic foot infection ([name])    ? Sepsis ([name]) 04/09/2018  ? Melanocytic nevus 02/28/2018  ? Lesion of labia 09/27/2017  ? Vaginitis and vulvovaginitis 02/19/2016  ? Cancer of sigmoid colon ([name]) 03/09/2015  ? History of stroke within last year 02/12/2015  ? OSA (obstructive sleep apnea) 07/04/2014  ? Migraine without aura, with intractable migraine, so stated, without mention of status migrainosus 03/26/2014  ? Peripheral neuropathy 03/03/2014  ? Abnormal stress test 02/12/2014  ? CVA (cerebral infarction) 02/12/2014  ? Chest pain   ? Abnormal heart rhythm   ? Edema   ? Hypertension   ? Hyperlipemia   ? Hypercholesterolemia   ? Uncontrolled type 2 diabetes mellitus with hyperglycemia, with long-term current use of insulin ([name]) 07/22/2013  ? Diabetic neuropathy with neurologic complication ([name]) 54/27/0623  ? Diabetic polyneuropathy ([name]) 07/22/2013  ? ? ?Past Surgical History:  ?Procedure Laterality Date  ? AMPUTATION Right 04/13/2018  ? Procedure: RIGHT BELOW KNEE AMPUTATION;  Surgeon: Newt Minion, MD;  Location: [facility];  Service: Orthopedics;  Laterality: Right;  ? AMPUTATION Left 10/26/2018  ? Procedure: LEFT FOOT FIRST RAY AMPUTATION;  Surgeon: Newt Minion, MD;  Location: [facility];  Service: Orthopedics;  Laterality: Left;  ? AMPUTATION TOE Left 05/10/2019  ? Procedure: AMPUTATION 2nd left TOE WITH METATARSAL;  Surgeon: Landis Martins, DPM;  Location: [facility];  Service: Podiatry;  Laterality: Left;  ? ANTERIOR CERVICAL DECOMP/DISCECTOMY FUSION  2000  ? C5 - 7  ? APPLICATION OF WOUND VAC Left 10/26/2018  ? Procedure: APPLICATION OF WOUND VAC;  Surgeon: Newt Minion, MD;  Location: [name];  Service: Orthopedics;  Laterality: Left;  ? BACK SURGERY    ? CARDIAC CATHETERIZATION  05-22-2008   DR Marlou Porch  ? NO SIGNIFECANT CAD/ NORMAL LV/  EF 65%/  NO WALL MOTION ABNORMALITIES  ? CARPAL TUNNEL RELEASE Right 04-25-2013  ? CATARACT EXTRACTION Right 10/24/2018  ? [facility]; 1992  ? COLON SURGERY    ? COLONOSCOPY    ? CYSTO N/A 04/30/2013  ?  Procedure: CYSTOSCOPY;  Surgeon: Reece Packer, MD;  Location: [facility];  Service: Urology;  Laterality: N/A;  ? CYSTOSCOPY MACROPLASTIQUE IMPLANT N/A 02/06/2018  ? Procedure: CYSTOSCOPY MACROPLASTIQUE IMPLANT;  Surgeon: Bjorn Loser, MD;  Location: [facility];  Service: Urology;  Laterality: N/A;  ? CYSTOSCOPY WITH INJECTION  05/04/2012  ? Procedure: CYSTOSCOPY WITH INJECTION;  Surgeon: Reece Packer, MD;  Location: [facility];  Service: Urology;  Laterality: N/A;  MACROPLASTIQUE INJECTION  ? CYSTOSCOPY WITH INJECTION  08/28/2012  ? Procedure: CYSTOSCOPY WITH INJECTION;  Surgeon: Reece Packer, MD;  Location: [facility];  Service: Urology;  Laterality: N/A;  cysto and  macroplastique   ? FOOT SURGERY Bilateral   ? "related to [place]"  ? HERNIA REPAIR  ?1996  ? "stomach"  ? I & D EXTREMITY Left 10/25/2018  ? Procedure: IRRIGATION AND DEBRIDEMENT GREAT TOE;  Surgeon: Meredith Pel, MD;  Location: [facility];  Service: Orthopedics;  Laterality: Left;  ? INCISION AND DRAINAGE Left 10/25/2018  ? GREAT TOE  ? INCISION AND DRAINAGE OF WOUND Right 04/11/2018  ? Procedure: IRRIGATION AND DEBRIDEMENT WOUND right foot and right ankle;  Surgeon: Evelina Bucy, DPM;  Location: [facility];  Service: Podiatry;  Laterality: Right;  ? KNEE ARTHROSCOPY Left   ? KNEE ARTHROSCOPY W/ ALLOGRAFT IMPANT Left   ? graft x 2  ? KNEE SURGERY    ? TOTAL 8 SURG'S  ? LAPAROSCOPIC SIGMOID COLECTOMY N/A 04/22/2015  ? Procedure: LAPAROSCOPIC HAND ASSISTED SIGMOID COLECTOMY;  Surgeon: Erroll Luna, MD;  Location: [facility];  Service: General;  Laterality: N/A;  ? LEFT HEART CATHETERIZATION WITH CORONARY ANGIOGRAM N/A 02/12/2014  ? Procedure: LEFT HEART CATHETERIZATION WITH CORONARY ANGIOGRAM;  Surgeon: Candee Furbish, MD;  Location: [facility];  Service: Cardiovascular;  Laterality: N/A;   No angiographically significant CAD; normal LVSF, LVEDP 77mHg,  EF 55% (new finding ef 30% myoview  01-08-2014)  ? LUMBAR FUSION    ? MANDIBLE FRACTURE SURGERY    ? MULTIPLE LAPAROSCOPIES FOR ENDOMETRIOSIS    ? PUBOVAGINAL SLING  12/29/2011  ? Procedure: PGaynelle Arabian  Surgeon: SReece Packer MD;  LWindell Moulding

## 2022-04-06 NOTE — ED Triage Notes (Signed)
Pt c/o bilat ear infection onset 2 weeks ago.  ?

## 2022-04-09 ENCOUNTER — Other Ambulatory Visit: Payer: Self-pay | Admitting: Endocrinology

## 2022-05-04 ENCOUNTER — Ambulatory Visit (INDEPENDENT_AMBULATORY_CARE_PROVIDER_SITE_OTHER): Payer: 59 | Admitting: Family

## 2022-05-04 ENCOUNTER — Encounter: Payer: Self-pay | Admitting: Family

## 2022-05-04 DIAGNOSIS — Z89511 Acquired absence of right leg below knee: Secondary | ICD-10-CM

## 2022-05-04 NOTE — Progress Notes (Signed)
? ?Office Visit Note ?  ?Patient: [name]           ?Date of Birth: 18-Oct-1966           ?MRN: [id number] ?Visit Date: 05/04/2022 ?             ?Requested by: Emelia Loron, NP ?[name],  [place] [address] ?PCP: Emelia Loron, NP ? ?Chief Complaint  ?Patient presents with  ? Right Leg - Follow-up  ?  HX right BKA 2019 prosthetic eval.   ? ? ? ? ?HPI: ?The patient is a 56 year old woman who presents today in follow-up for her right residual limb she is status post right below-knee amputation 4 years ago she is currently in her first prosthetic which is still fitting she has new ulcers over her tibial tubercle x2 she is complaining of pain from end bearing ? ?She is hopeful that she could be set up with a water leg so she can play at the beach with her grandchildren ?Assessment & Plan: ?Visit Diagnoses: No diagnosis found. ? ?Plan: Given an order for new prosthesis set up.  Feel that the ulcers are from an ill fitting of her prosthesis discussed daily Dial soap cleansing.  Hopes that these will heal with fabrication of a new socket ? ?Follow-Up Instructions: Return in about 4 weeks (around 06/01/2022).  ? ?Ortho Exam ? ?Patient is alert, oriented, no adenopathy, well-dressed, normal affect, normal respiratory effort. ?On examination of the right residual limb this is well consolidated she does have 2 ulcers over her tibial tubercle these are 1 cm in diameter there is no depth these are filled in with 50% fibrinous exudative tissue there is scant serosanguineous drainage.  She does have a little bit of tenderness to the distal tibia from an bearing there is no callus or impending ulceration in this area. ? ?No erythema no warmth ? ?Imaging: ?No results found. ?No images are attached to the encounter. ? ?Labs: ?Lab Results  ?Component Value Date  ? HGBA1C 6.5 02/10/2022  ? HGBA1C 7.4 (A) 09/20/2021  ? HGBA1C 5.9 12/22/2020  ? ESRSEDRATE 55 (H) 02/19/2016  ? CRP 17.4 (H) 02/19/2016  ? LABURIC 5.8  02/19/2016  ? REPTSTATUS 07/27/2021 FINAL 07/23/2021  ? GRAMSTAIN  05/10/2019  ?  NO WBC SEEN ?NO ORGANISMS SEEN ?Performed at [facility], [place] [address], [place], [place] [address] ?  ? CULT >=100,000 COLONIES/mL ENTEROBACTER CLOACAE (A) 07/23/2021  ? [place] (A) 07/23/2021  ? ? ? ?Lab Results  ?Component Value Date  ? ALBUMIN 4.2 02/10/2022  ? ALBUMIN 4.1 01/02/2022  ? ALBUMIN 3.7 09/20/2021  ? ? ?Lab Results  ?Component Value Date  ? MG 1.3 (L) 01/03/2022  ? MG 1.8 04/13/2018  ? MG 1.7 04/12/2018  ? ?Lab Results  ?Component Value Date  ? VD25OH 21 06/24/2020  ? ? ?No results found for: PREALBUMIN ? ?  Latest Ref Rng & Units 01/02/2022  ?  7:09 PM 07/23/2021  ?  6:30 PM 12/22/2020  ?  3:23 PM  ?CBC EXTENDED  ?WBC 4.0 - 10.5 K/uL 11.7   CANCELED   13.5    ?RBC 3.87 - 5.11 MIL/uL 4.54   CANCELED   3.86    ?Hemoglobin 12.0 - 15.0 g/dL 13.7   CANCELED   12.0    ?HCT 36.0 - 46.0 % 41.3   CANCELED   35.0    ?Platelets 150 - 400 K/uL 306   CANCELED  200.0    ?NEUT# 1.7 - 7.7 K/uL 8.7      ?Lymph# 0.7 - 4.0 K/uL 2.4   CANCELED     ? ? ? ?There is no height or weight on file to calculate BMI. ? ?Orders:  ?No orders of the defined types were placed in this encounter. ? ?No orders of the defined types were placed in this encounter. ? ? ? Procedures: ?No procedures performed ? ?Clinical Data: ?No additional findings. ? ?ROS: ? ?All other systems negative, except as noted in the HPI. ?Review of Systems ? ?Objective: ?Vital Signs: There were no vitals taken for this visit. ? ?Specialty Comments:  ?No specialty comments available. ? ?PMFS History: ?Patient Active Problem List  ? Diagnosis Date Noted  ? Diarrhea 01/03/2022  ? Hypokalemia 01/03/2022  ? Palpitations 01/29/2021  ? Nicotine dependence 03/06/2020  ? Genetic testing 03/12/2019  ? Family history of colon cancer   ? Family history of bladder cancer   ? Family history of kidney cancer   ? Postmenopausal bleeding 11/15/2018  ? Dog bite of foot,  sequela   ? Left foot infection 10/25/2018  ? Gangrenous disorder ([place]) 09/17/2018  ? Cellulitis of left upper extremity   ? Poorly controlled diabetes mellitus ([place])   ? UTI (urinary tract infection) 05/23/2018  ? Acute metabolic encephalopathy 37/90/2409  ? GERD (gastroesophageal reflux disease) 05/09/2018  ? Tobacco abuse 05/09/2018  ? Polypharmacy 05/09/2018  ? Restless leg syndrome 04/27/2018  ? Anxiety 04/27/2018  ? Hyponatremia 04/26/2018  ? Noncompliance with dietary restriction 04/26/2018  ? Postoperative cellulitis of surgical wound 04/26/2018  ? At high risk for falls 04/24/2018  ? Noncompliance with safety precautions 04/24/2018  ? Leukocytosis 04/24/2018  ? 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 PAD (peripheral artery disease) ([place])   ? Poorly controlled type 2 diabetes mellitus ([place])   ? Subacute osteomyelitis, right ankle and foot ([place])   ? Major depressive disorder, recurrent episode ([place]) 04/11/2018  ? Diabetic ulcer of right midfoot associated with type 2 diabetes mellitus, with necrosis of bone ([place])   ? Abscess of ankle   ? Infectious synovitis   ? Diabetic foot infection ([place])   ? Sepsis ([place]) 04/09/2018  ? Melanocytic nevus 02/28/2018  ? Lesion of labia 09/27/2017  ? Vaginitis and vulvovaginitis 02/19/2016  ? Cancer of sigmoid colon ([place]) 03/09/2015  ? History of stroke within last year 02/12/2015  ? OSA (obstructive sleep apnea) 07/04/2014  ? Migraine without aura, with intractable migraine, so stated, without mention of status migrainosus 03/26/2014  ? Peripheral neuropathy 03/03/2014  ? Abnormal stress test 02/12/2014  ? CVA (cerebral infarction) 02/12/2014  ? Chest pain   ? Abnormal heart rhythm   ? Edema   ? Hypertension   ? Hyperlipemia   ? Hypercholesterolemia   ? Uncontrolled type 2 diabetes mellitus with hyperglycemia, with long-term current use of insulin ([place]) 07/22/2013  ? Diabetic neuropathy with neurologic complication ([place]) 73/53/2992  ? Diabetic polyneuropathy ([place])  07/22/2013  ? ?Past Medical History:  ?Diagnosis Date  ? Anxiety   ? Arthritis   ? Bipolar disorder ([place])   ? Broken jaw ([place])   ? Broken wrist   ? Chronic back pain   ? Claudication [facility])   ? a. 12/2013 ABI's: R 0.97, L 0.94.  ? Colon cancer ([place])   ? COPD (chronic obstructive pulmonary disease) ([place])   ? Depression   ? Diabetic Charcot's foot ([place])   ? Diabetic foot ulcer ([place])   ?  chronic left foot ulcer , great toe/  hx recurrent foot ulcer bilaterally  ? DJD (degenerative joint disease)   ? Edema of both lower extremities   ? Episode of memory loss   ? Family history of bladder cancer   ? Family history of colon cancer   ? Family history of kidney cancer   ? GERD (gastroesophageal reflux disease)   ? Headache(784.0)   ? Hiatal hernia   ? History of colon cancer, stage I dx 02-26-2015--- oncologist-- dr [name]--- per last note no recurrence  ? 05-16-2015 s/p  Laparoscopic sigmoid colectomy w/ node bx's (negative nodes per path)  Stage I (pT1,N0,M0) Grade 2  ? 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  ? per MRI multiple small strokes post cardiac cath. --  residual mild memory loss  ? History of methicillin resistant staphylococcus aureus (MRSA)   ? Hypercholesterolemia   ? Hypertension   ? Insomnia   ? Insulin dependent type 2 diabetes mellitus, uncontrolled dx 2004  ? endocrinologist-  dr Dwyane Dee-- last A1c 8.8 in Aug2018:  pt is noncompliant w/ diet, states does note eat breakfast , her first main meal in afternoon  ? Neurogenic bladder   ? Neuropathy, diabetic ([place])   ? hands and feet  ? OSA (obstructive sleep apnea)   ? cpap intolerant  ? Personality disorder ([place])   ? Restless leg syndrome   ? SOB (shortness of breath) on exertion   ? Stroke [facility])   ? SUI (stress urinary incontinence, female) S/P SLING 12-29-2011  ?  ?Family History  ?Problem Relation Age of Onset  ? Hypertension Mother   ? Diabetes Mother   ? Cancer - Other Mother   ?     lymphoma   ? Cancer - Other Father   ?      lung, bladder cancer   ? Heart attack Father   ? Other Father   ?     Lynch syndrome  ? Cancer - Other Brother   ?     bladder cancer   ? Bladder Cancer Paternal Aunt   ?     Lynch syndrome  ? Other Paternal Un

## 2022-05-17 ENCOUNTER — Other Ambulatory Visit: Payer: Self-pay

## 2022-05-17 DIAGNOSIS — E1165 Type 2 diabetes mellitus with hyperglycemia: Secondary | ICD-10-CM

## 2022-05-17 MED ORDER — FREESTYLE LIBRE 2 READER DEVI: 0 refills | Status: AC

## 2022-05-24 ENCOUNTER — Other Ambulatory Visit: Payer: Self-pay | Admitting: Endocrinology

## 2022-05-31 ENCOUNTER — Other Ambulatory Visit (HOSPITAL_COMMUNITY): Payer: Self-pay

## 2022-06-01 ENCOUNTER — Other Ambulatory Visit (HOSPITAL_COMMUNITY): Payer: Self-pay

## 2022-06-02 ENCOUNTER — Other Ambulatory Visit (HOSPITAL_COMMUNITY): Payer: Self-pay

## 2022-06-02 MED ORDER — OXYCODONE-ACETAMINOPHEN 10-325 MG PO TABS
ORAL_TABLET | ORAL | 0 refills | Status: DC
  Filled 2022-06-02: qty 150, 30d supply, fill #0

## 2022-06-06 ENCOUNTER — Other Ambulatory Visit: Payer: Self-pay | Admitting: Endocrinology

## 2022-07-04 ENCOUNTER — Other Ambulatory Visit (HOSPITAL_COMMUNITY): Payer: Self-pay

## 2022-07-04 MED ORDER — OXYCODONE-ACETAMINOPHEN 10-325 MG PO TABS
ORAL_TABLET | ORAL | 0 refills | Status: DC
  Filled 2022-07-04: qty 20, 5d supply, fill #0

## 2022-07-04 MED ORDER — QUVIVIQ 25 MG PO TABS
ORAL_TABLET | ORAL | 0 refills | Status: DC
  Filled 2022-07-04: qty 5, 5d supply, fill #0

## 2022-07-05 ENCOUNTER — Other Ambulatory Visit (HOSPITAL_COMMUNITY): Payer: Self-pay

## 2022-07-08 ENCOUNTER — Other Ambulatory Visit (HOSPITAL_COMMUNITY): Payer: Self-pay

## 2022-07-08 MED ORDER — OXYCODONE-ACETAMINOPHEN 10-325 MG PO TABS
ORAL_TABLET | ORAL | 0 refills | Status: DC
  Filled 2022-07-08: qty 150, 30d supply, fill #0

## 2022-08-08 ENCOUNTER — Other Ambulatory Visit (HOSPITAL_COMMUNITY): Payer: Self-pay

## 2022-08-08 MED ORDER — AMOXICILLIN-POT CLAVULANATE 875-125 MG PO TABS
ORAL_TABLET | ORAL | 0 refills | Status: DC
  Filled 2022-08-08: qty 14, 7d supply, fill #0

## 2022-08-08 MED ORDER — OXYCODONE-ACETAMINOPHEN 10-325 MG PO TABS
ORAL_TABLET | ORAL | 0 refills | Status: DC
  Filled 2022-08-08: qty 180, 30d supply, fill #0

## 2022-08-31 ENCOUNTER — Encounter: Payer: Self-pay | Admitting: Emergency Medicine

## 2022-08-31 ENCOUNTER — Inpatient Hospital Stay (HOSPITAL_COMMUNITY)
Admission: EM | Admit: 2022-08-31 | Discharge: 2022-09-06 | DRG: 500 | Disposition: A | Discharge status: Skilled Nursing Facility | Payer: 59 | Source: Ambulatory Visit | Attending: Internal Medicine | Admitting: Internal Medicine

## 2022-08-31 ENCOUNTER — Emergency Department (HOSPITAL_COMMUNITY): Payer: 59

## 2022-08-31 ENCOUNTER — Encounter (HOSPITAL_COMMUNITY): Payer: Self-pay | Admitting: Emergency Medicine

## 2022-08-31 ENCOUNTER — Ambulatory Visit
Admission: EM | Admit: 2022-08-31 | Discharge: 2022-08-31 | Disposition: A | Discharge status: Home / Self Care | Payer: 59

## 2022-08-31 DIAGNOSIS — Z794 Long term (current) use of insulin: Secondary | ICD-10-CM

## 2022-08-31 DIAGNOSIS — G2401 Drug induced subacute dyskinesia: Secondary | ICD-10-CM | POA: Diagnosis present

## 2022-08-31 DIAGNOSIS — Z8249 Family history of ischemic heart disease and other diseases of the circulatory system: Secondary | ICD-10-CM

## 2022-08-31 DIAGNOSIS — D649 Anemia, unspecified: Secondary | ICD-10-CM | POA: Diagnosis present

## 2022-08-31 DIAGNOSIS — Z89432 Acquired absence of left foot: Secondary | ICD-10-CM

## 2022-08-31 DIAGNOSIS — Z79899 Other long term (current) drug therapy: Secondary | ICD-10-CM

## 2022-08-31 DIAGNOSIS — Z79890 Hormone replacement therapy (postmenopausal): Secondary | ICD-10-CM

## 2022-08-31 DIAGNOSIS — Z981 Arthrodesis status: Secondary | ICD-10-CM

## 2022-08-31 DIAGNOSIS — Z800 Family history of malignant neoplasm of digestive organs: Secondary | ICD-10-CM

## 2022-08-31 DIAGNOSIS — I739 Peripheral vascular disease, unspecified: Secondary | ICD-10-CM | POA: Diagnosis present

## 2022-08-31 DIAGNOSIS — M71162 Other infective bursitis, left knee: Secondary | ICD-10-CM | POA: Diagnosis present

## 2022-08-31 DIAGNOSIS — F32A Depression, unspecified: Secondary | ICD-10-CM | POA: Diagnosis present

## 2022-08-31 DIAGNOSIS — M009 Pyogenic arthritis, unspecified: Secondary | ICD-10-CM

## 2022-08-31 DIAGNOSIS — J449 Chronic obstructive pulmonary disease, unspecified: Secondary | ICD-10-CM | POA: Diagnosis present

## 2022-08-31 DIAGNOSIS — E1140 Type 2 diabetes mellitus with diabetic neuropathy, unspecified: Secondary | ICD-10-CM | POA: Diagnosis present

## 2022-08-31 DIAGNOSIS — N319 Neuromuscular dysfunction of bladder, unspecified: Secondary | ICD-10-CM | POA: Diagnosis present

## 2022-08-31 DIAGNOSIS — N289 Disorder of kidney and ureter, unspecified: Secondary | ICD-10-CM

## 2022-08-31 DIAGNOSIS — I10 Essential (primary) hypertension: Secondary | ICD-10-CM | POA: Diagnosis present

## 2022-08-31 DIAGNOSIS — E1151 Type 2 diabetes mellitus with diabetic peripheral angiopathy without gangrene: Secondary | ICD-10-CM | POA: Diagnosis present

## 2022-08-31 DIAGNOSIS — N179 Acute kidney failure, unspecified: Secondary | ICD-10-CM | POA: Diagnosis not present

## 2022-08-31 DIAGNOSIS — Z89511 Acquired absence of right leg below knee: Secondary | ICD-10-CM

## 2022-08-31 DIAGNOSIS — K219 Gastro-esophageal reflux disease without esophagitis: Secondary | ICD-10-CM | POA: Diagnosis present

## 2022-08-31 DIAGNOSIS — M00062 Staphylococcal arthritis, left knee: Secondary | ICD-10-CM | POA: Diagnosis present

## 2022-08-31 DIAGNOSIS — L089 Local infection of the skin and subcutaneous tissue, unspecified: Secondary | ICD-10-CM

## 2022-08-31 DIAGNOSIS — Z720 Tobacco use: Secondary | ICD-10-CM | POA: Diagnosis present

## 2022-08-31 DIAGNOSIS — Z79891 Long term (current) use of opiate analgesic: Secondary | ICD-10-CM

## 2022-08-31 DIAGNOSIS — Z20822 Contact with and (suspected) exposure to covid-19: Secondary | ICD-10-CM | POA: Diagnosis present

## 2022-08-31 DIAGNOSIS — E039 Hypothyroidism, unspecified: Secondary | ICD-10-CM | POA: Diagnosis present

## 2022-08-31 DIAGNOSIS — F419 Anxiety disorder, unspecified: Secondary | ICD-10-CM

## 2022-08-31 DIAGNOSIS — B9689 Other specified bacterial agents as the cause of diseases classified elsewhere: Secondary | ICD-10-CM

## 2022-08-31 DIAGNOSIS — E7800 Pure hypercholesterolemia, unspecified: Secondary | ICD-10-CM | POA: Diagnosis present

## 2022-08-31 DIAGNOSIS — Z807 Family history of other malignant neoplasms of lymphoid, hematopoietic and related tissues: Secondary | ICD-10-CM

## 2022-08-31 DIAGNOSIS — Z90710 Acquired absence of both cervix and uterus: Secondary | ICD-10-CM

## 2022-08-31 DIAGNOSIS — K30 Functional dyspepsia: Secondary | ICD-10-CM | POA: Diagnosis present

## 2022-08-31 DIAGNOSIS — R112 Nausea with vomiting, unspecified: Secondary | ICD-10-CM | POA: Diagnosis not present

## 2022-08-31 DIAGNOSIS — B9562 Methicillin resistant Staphylococcus aureus infection as the cause of diseases classified elsewhere: Secondary | ICD-10-CM | POA: Diagnosis present

## 2022-08-31 DIAGNOSIS — G4733 Obstructive sleep apnea (adult) (pediatric): Secondary | ICD-10-CM | POA: Diagnosis present

## 2022-08-31 DIAGNOSIS — R69 Illness, unspecified: Secondary | ICD-10-CM

## 2022-08-31 DIAGNOSIS — Z8051 Family history of malignant neoplasm of kidney: Secondary | ICD-10-CM

## 2022-08-31 DIAGNOSIS — E876 Hypokalemia: Secondary | ICD-10-CM | POA: Diagnosis not present

## 2022-08-31 DIAGNOSIS — E11610 Type 2 diabetes mellitus with diabetic neuropathic arthropathy: Secondary | ICD-10-CM | POA: Diagnosis present

## 2022-08-31 DIAGNOSIS — E669 Obesity, unspecified: Secondary | ICD-10-CM | POA: Diagnosis present

## 2022-08-31 DIAGNOSIS — I69311 Memory deficit following cerebral infarction: Secondary | ICD-10-CM

## 2022-08-31 DIAGNOSIS — F3181 Bipolar II disorder: Secondary | ICD-10-CM | POA: Diagnosis present

## 2022-08-31 DIAGNOSIS — I959 Hypotension, unspecified: Secondary | ICD-10-CM | POA: Diagnosis present

## 2022-08-31 DIAGNOSIS — M25462 Effusion, left knee: Secondary | ICD-10-CM

## 2022-08-31 DIAGNOSIS — L03116 Cellulitis of left lower limb: Secondary | ICD-10-CM | POA: Diagnosis present

## 2022-08-31 DIAGNOSIS — E785 Hyperlipidemia, unspecified: Secondary | ICD-10-CM | POA: Diagnosis present

## 2022-08-31 DIAGNOSIS — M71169 Other infective bursitis, unspecified knee: Secondary | ICD-10-CM | POA: Diagnosis present

## 2022-08-31 DIAGNOSIS — M25562 Pain in left knee: Secondary | ICD-10-CM | POA: Diagnosis not present

## 2022-08-31 DIAGNOSIS — F17290 Nicotine dependence, other tobacco product, uncomplicated: Secondary | ICD-10-CM | POA: Diagnosis present

## 2022-08-31 DIAGNOSIS — G4700 Insomnia, unspecified: Secondary | ICD-10-CM | POA: Diagnosis present

## 2022-08-31 DIAGNOSIS — Z888 Allergy status to other drugs, medicaments and biological substances status: Secondary | ICD-10-CM

## 2022-08-31 DIAGNOSIS — Z8614 Personal history of Methicillin resistant Staphylococcus aureus infection: Secondary | ICD-10-CM

## 2022-08-31 DIAGNOSIS — Z6834 Body mass index (BMI) 34.0-34.9, adult: Secondary | ICD-10-CM

## 2022-08-31 DIAGNOSIS — E871 Hypo-osmolality and hyponatremia: Secondary | ICD-10-CM | POA: Diagnosis present

## 2022-08-31 DIAGNOSIS — G928 Other toxic encephalopathy: Secondary | ICD-10-CM | POA: Diagnosis not present

## 2022-08-31 DIAGNOSIS — E1165 Type 2 diabetes mellitus with hyperglycemia: Secondary | ICD-10-CM | POA: Diagnosis present

## 2022-08-31 DIAGNOSIS — F4310 Post-traumatic stress disorder, unspecified: Secondary | ICD-10-CM | POA: Diagnosis present

## 2022-08-31 DIAGNOSIS — Z7984 Long term (current) use of oral hypoglycemic drugs: Secondary | ICD-10-CM

## 2022-08-31 DIAGNOSIS — Z85038 Personal history of other malignant neoplasm of large intestine: Secondary | ICD-10-CM

## 2022-08-31 DIAGNOSIS — Z8052 Family history of malignant neoplasm of bladder: Secondary | ICD-10-CM

## 2022-08-31 DIAGNOSIS — E872 Acidosis, unspecified: Secondary | ICD-10-CM | POA: Diagnosis present

## 2022-08-31 DIAGNOSIS — E119 Type 2 diabetes mellitus without complications: Secondary | ICD-10-CM | POA: Diagnosis not present

## 2022-08-31 DIAGNOSIS — G8929 Other chronic pain: Secondary | ICD-10-CM | POA: Diagnosis present

## 2022-08-31 DIAGNOSIS — Z886 Allergy status to analgesic agent status: Secondary | ICD-10-CM

## 2022-08-31 DIAGNOSIS — Z91040 Latex allergy status: Secondary | ICD-10-CM

## 2022-08-31 DIAGNOSIS — Z833 Family history of diabetes mellitus: Secondary | ICD-10-CM

## 2022-08-31 LAB — BASIC METABOLIC PANEL
Anion gap: 18 — ABNORMAL HIGH (ref 5–15)
BUN: 8 mg/dL (ref 6–20)
CO2: 27 mmol/L (ref 22–32)
Calcium: 7.9 mg/dL — ABNORMAL LOW (ref 8.9–10.3)
Chloride: 79 mmol/L — ABNORMAL LOW (ref 98–111)
Creatinine, Ser: 1.15 mg/dL — ABNORMAL HIGH (ref 0.44–1.00)
GFR, Estimated: 56 mL/min — ABNORMAL LOW (ref >60)
Glucose, Bld: 182 mg/dL — ABNORMAL HIGH (ref 70–99)
Potassium: 2.8 mmol/L — ABNORMAL LOW (ref 3.5–5.1)
Sodium: 124 mmol/L — ABNORMAL LOW (ref 135–145)

## 2022-08-31 LAB — HEMOGLOBIN A1C
Hgb A1c MFr Bld: 9.7 % — ABNORMAL HIGH (ref 4.8–5.6)
Mean Plasma Glucose: 231.69 mg/dL

## 2022-08-31 LAB — RAPID URINE DRUG SCREEN, HOSP PERFORMED
Amphetamines: NOT DETECTED
Barbiturates: NOT DETECTED
Benzodiazepines: POSITIVE — AB
Cocaine: NOT DETECTED
Opiates: NOT DETECTED
Tetrahydrocannabinol: NOT DETECTED

## 2022-08-31 LAB — I-STAT VENOUS BLOOD GAS, ED
Acid-Base Excess: 9 mmol/L — ABNORMAL HIGH (ref 0.0–2.0)
Bicarbonate: 31 mmol/L — ABNORMAL HIGH (ref 20.0–28.0)
Calcium, Ion: 0.9 mmol/L — ABNORMAL LOW (ref 1.15–1.40)
HCT: 29 % — ABNORMAL LOW (ref 36.0–46.0)
Hemoglobin: 9.9 g/dL — ABNORMAL LOW (ref 12.0–15.0)
O2 Saturation: 94 %
Potassium: 2.8 mmol/L — ABNORMAL LOW (ref 3.5–5.1)
Sodium: 120 mmol/L — ABNORMAL LOW (ref 135–145)
TCO2: 32 mmol/L (ref 22–32)
pCO2, Ven: 32.1 mmHg — ABNORMAL LOW (ref 44–60)
pH, Ven: 7.592 — ABNORMAL HIGH (ref 7.25–7.43)
pO2, Ven: 58 mmHg — ABNORMAL HIGH (ref 32–45)

## 2022-08-31 LAB — HIV ANTIBODY (ROUTINE TESTING W REFLEX): HIV Screen 4th Generation wRfx: NONREACTIVE

## 2022-08-31 LAB — HEMOGLOBIN AND HEMATOCRIT, BLOOD
HCT: 24 % — ABNORMAL LOW (ref 36.0–46.0)
Hemoglobin: 9 g/dL — ABNORMAL LOW (ref 12.0–15.0)

## 2022-08-31 LAB — COMPREHENSIVE METABOLIC PANEL
ALT: 16 U/L (ref 0–44)
AST: 24 U/L (ref 15–41)
Albumin: 3.4 g/dL — ABNORMAL LOW (ref 3.5–5.0)
Alkaline Phosphatase: 62 U/L (ref 38–126)
Anion gap: 17 — ABNORMAL HIGH (ref 5–15)
BUN: 8 mg/dL (ref 6–20)
CO2: 25 mmol/L (ref 22–32)
Calcium: 8 mg/dL — ABNORMAL LOW (ref 8.9–10.3)
Chloride: 79 mmol/L — ABNORMAL LOW (ref 98–111)
Creatinine, Ser: 1.2 mg/dL — ABNORMAL HIGH (ref 0.44–1.00)
GFR, Estimated: 53 mL/min — ABNORMAL LOW (ref >60)
Glucose, Bld: 225 mg/dL — ABNORMAL HIGH (ref 70–99)
Potassium: 2.7 mmol/L — CL (ref 3.5–5.1)
Sodium: 121 mmol/L — ABNORMAL LOW (ref 135–145)
Total Bilirubin: 0.6 mg/dL (ref 0.3–1.2)
Total Protein: 5.9 g/dL — ABNORMAL LOW (ref 6.5–8.1)

## 2022-08-31 LAB — SEDIMENTATION RATE: Sed Rate: 8 mm/hr (ref 0–22)

## 2022-08-31 LAB — URINALYSIS, ROUTINE W REFLEX MICROSCOPIC
Bilirubin Urine: NEGATIVE
Glucose, UA: NEGATIVE mg/dL
Ketones, ur: NEGATIVE mg/dL
Leukocytes,Ua: NEGATIVE
Nitrite: NEGATIVE
Protein, ur: NEGATIVE mg/dL
Specific Gravity, Urine: 1.008 (ref 1.005–1.030)
pH: 5 (ref 5.0–8.0)

## 2022-08-31 LAB — TYPE AND SCREEN
ABO/RH(D): O NEG
Antibody Screen: NEGATIVE

## 2022-08-31 LAB — TSH: TSH: 3.92 u[IU]/mL (ref 0.350–4.500)

## 2022-08-31 LAB — CBC
HCT: 25.4 % — ABNORMAL LOW (ref 36.0–46.0)
Hemoglobin: 9.2 g/dL — ABNORMAL LOW (ref 12.0–15.0)
MCH: 30.3 pg (ref 26.0–34.0)
MCHC: 36.2 g/dL — ABNORMAL HIGH (ref 30.0–36.0)
MCV: 83.6 fL (ref 80.0–100.0)
Platelets: 291 10*3/uL (ref 150–400)
RBC: 3.04 MIL/uL — ABNORMAL LOW (ref 3.87–5.11)
RDW: 12.1 % (ref 11.5–15.5)
WBC: 8.5 10*3/uL (ref 4.0–10.5)
nRBC: 0 % (ref 0.0–0.2)

## 2022-08-31 LAB — MAGNESIUM: Magnesium: 0.8 mg/dL — CL (ref 1.7–2.4)

## 2022-08-31 LAB — LACTIC ACID, PLASMA
Lactic Acid, Venous: 4.8 mmol/L (ref 0.5–1.9)
Lactic Acid, Venous: 3.4 mmol/L (ref 0.5–1.9)
Lactic Acid, Venous: 2.8 mmol/L (ref 0.5–1.9)

## 2022-08-31 LAB — CBG MONITORING, ED: Glucose-Capillary: 178 mg/dL — ABNORMAL HIGH (ref 70–99)

## 2022-08-31 LAB — OSMOLALITY, URINE: Osmolality, Ur: 154 mOsm/kg — ABNORMAL LOW (ref 300–900)

## 2022-08-31 LAB — SODIUM, URINE, RANDOM: Sodium, Ur: 27 mmol/L

## 2022-08-31 LAB — SARS CORONAVIRUS 2 BY RT PCR: SARS Coronavirus 2 by RT PCR: NEGATIVE

## 2022-08-31 LAB — C-REACTIVE PROTEIN: CRP: 1.2 mg/dL — ABNORMAL HIGH (ref <1.0)

## 2022-08-31 MED ORDER — LIDOCAINE HCL (PF) 1 % IJ SOLN
2.0000 mL | Freq: Once | INTRAMUSCULAR | Status: AC
  Administered 2022-08-31: 2 mL

## 2022-08-31 MED ORDER — ACETAMINOPHEN 650 MG RE SUPP: 650.0000 mg | Freq: Four times a day (QID) | RECTAL | Status: DC | PRN

## 2022-08-31 MED ORDER — SODIUM CHLORIDE 0.45 % IV SOLN
INTRAVENOUS | Status: DC
  Filled 2022-08-31 (×3): qty 1000

## 2022-08-31 MED ORDER — HYDROMORPHONE HCL 1 MG/ML IJ SOLN: 1.0000 mg | INTRAMUSCULAR | Status: DC | PRN

## 2022-08-31 MED ORDER — LISINOPRIL 20 MG PO TABS
20.0000 mg | ORAL_TABLET | Freq: Every day | ORAL | Status: DC
  Administered 2022-08-31: 20 mg via ORAL
  Filled 2022-08-31: qty 1

## 2022-08-31 MED ORDER — HYDROMORPHONE HCL 1 MG/ML IJ SOLN
1.0000 mg | INTRAMUSCULAR | Status: DC | PRN
  Administered 2022-08-31 (×2): 1 mg via INTRAVENOUS
  Filled 2022-08-31 (×2): qty 1

## 2022-08-31 MED ORDER — EMPAGLIFLOZIN 25 MG PO TABS
25.0000 mg | ORAL_TABLET | Freq: Every day | ORAL | Status: DC
  Administered 2022-08-31: 25 mg via ORAL
  Filled 2022-08-31 (×3): qty 1

## 2022-08-31 MED ORDER — SODIUM CHLORIDE 0.9 % IV SOLN
2.0000 g | Freq: Once | INTRAVENOUS | Status: AC
  Administered 2022-08-31: 2 g via INTRAVENOUS
  Filled 2022-08-31: qty 20

## 2022-08-31 MED ORDER — FENOFIBRATE 160 MG PO TABS
160.0000 mg | ORAL_TABLET | Freq: Every day | ORAL | Status: DC
  Administered 2022-08-31: 160 mg via ORAL
  Filled 2022-08-31 (×3): qty 1

## 2022-08-31 MED ORDER — LACTATED RINGERS IV BOLUS
1000.0000 mL | Freq: Once | INTRAVENOUS | Status: AC
  Administered 2022-08-31: 1000 mL via INTRAVENOUS

## 2022-08-31 MED ORDER — ACETAMINOPHEN 325 MG PO TABS
650.0000 mg | ORAL_TABLET | Freq: Four times a day (QID) | ORAL | Status: DC | PRN
  Administered 2022-09-01 – 2022-09-03 (×2): 650 mg via ORAL
  Filled 2022-08-31 (×2): qty 2

## 2022-08-31 MED ORDER — CARIPRAZINE HCL 6 MG PO CAPS: 6.0000 mg | ORAL_CAPSULE | Freq: Every day | ORAL | Status: DC

## 2022-08-31 MED ORDER — DULOXETINE HCL 60 MG PO CPEP
60.0000 mg | ORAL_CAPSULE | Freq: Every day | ORAL | Status: DC
  Administered 2022-08-31: 60 mg via ORAL
  Filled 2022-08-31: qty 1

## 2022-08-31 MED ORDER — QUETIAPINE FUMARATE ER 200 MG PO TB24
800.0000 mg | ORAL_TABLET | Freq: Every day | ORAL | Status: DC
  Administered 2022-08-31: 800 mg via ORAL
  Filled 2022-08-31: qty 2
  Filled 2022-08-31 (×2): qty 4

## 2022-08-31 MED ORDER — SODIUM CHLORIDE 0.9 % IV SOLN
3.0000 g | Freq: Four times a day (QID) | INTRAVENOUS | Status: DC
  Administered 2022-08-31 – 2022-09-03 (×12): 3 g via INTRAVENOUS
  Filled 2022-08-31 (×12): qty 8

## 2022-08-31 MED ORDER — OXYCODONE HCL 5 MG PO TABS
5.0000 mg | ORAL_TABLET | ORAL | Status: DC | PRN
  Administered 2022-09-02 – 2022-09-06 (×4): 5 mg via ORAL
  Filled 2022-08-31 (×4): qty 1

## 2022-08-31 MED ORDER — LEVOTHYROXINE SODIUM 25 MCG PO TABS
25.0000 ug | ORAL_TABLET | Freq: Every day | ORAL | Status: DC
  Filled 2022-08-31: qty 1

## 2022-08-31 MED ORDER — VANCOMYCIN HCL 1750 MG/350ML IV SOLN
1750.0000 mg | Freq: Once | INTRAVENOUS | Status: AC
Start: 2022-08-31 — End: 2022-08-31
  Administered 2022-08-31: 1750 mg via INTRAVENOUS
  Filled 2022-08-31: qty 350

## 2022-08-31 MED ORDER — MAGNESIUM SULFATE 2 GM/50ML IV SOLN
2.0000 g | Freq: Once | INTRAVENOUS | Status: AC
  Administered 2022-08-31: 2 g via INTRAVENOUS
  Filled 2022-08-31: qty 50

## 2022-08-31 MED ORDER — CARIPRAZINE HCL 1.5 MG PO CAPS
6.0000 mg | ORAL_CAPSULE | Freq: Every day | ORAL | Status: DC
Start: 2022-08-31 — End: 2022-09-01
  Administered 2022-08-31: 6 mg via ORAL
  Filled 2022-08-31: qty 2
  Filled 2022-08-31 (×2): qty 4

## 2022-08-31 MED ORDER — ZOLPIDEM TARTRATE 5 MG PO TABS: 5.0000 mg | ORAL_TABLET | Freq: Every day | ORAL | Status: DC

## 2022-08-31 MED ORDER — INSULIN ASPART 100 UNIT/ML IJ SOLN: 0.0000 [IU] | Freq: Every day | INTRAMUSCULAR | Status: DC

## 2022-08-31 MED ORDER — INSULIN GLARGINE-YFGN 100 UNIT/ML ~~LOC~~ SOLN
6.0000 [IU] | Freq: Every day | SUBCUTANEOUS | Status: DC
  Administered 2022-09-01: 6 [IU] via SUBCUTANEOUS
  Filled 2022-08-31 (×3): qty 0.06

## 2022-08-31 MED ORDER — BISOPROLOL FUMARATE 5 MG PO TABS
5.0000 mg | ORAL_TABLET | Freq: Every day | ORAL | Status: DC
  Administered 2022-08-31: 5 mg via ORAL
  Filled 2022-08-31 (×2): qty 1

## 2022-08-31 MED ORDER — OXYCODONE-ACETAMINOPHEN 5-325 MG PO TABS
1.0000 | ORAL_TABLET | ORAL | Status: DC | PRN
  Administered 2022-09-02 – 2022-09-06 (×13): 1 via ORAL
  Filled 2022-08-31 (×13): qty 1

## 2022-08-31 MED ORDER — DIPHENHYDRAMINE HCL 50 MG/ML IJ SOLN
12.5000 mg | Freq: Four times a day (QID) | INTRAMUSCULAR | Status: DC | PRN
Start: 2022-08-31 — End: 2022-09-01
  Administered 2022-08-31: 12.5 mg via INTRAVENOUS
  Filled 2022-08-31: qty 1

## 2022-08-31 MED ORDER — VANCOMYCIN HCL IN DEXTROSE 1-5 GM/200ML-% IV SOLN
1000.0000 mg | INTRAVENOUS | Status: DC
  Administered 2022-09-01 – 2022-09-02 (×2): 1000 mg via INTRAVENOUS
  Filled 2022-08-31 (×3): qty 200

## 2022-08-31 MED ORDER — ONDANSETRON HCL 4 MG PO TABS: 4.0000 mg | ORAL_TABLET | Freq: Four times a day (QID) | ORAL | Status: DC | PRN

## 2022-08-31 MED ORDER — ZOLPIDEM TARTRATE 5 MG PO TABS: 5.0000 mg | ORAL_TABLET | Freq: Every evening | ORAL | Status: DC | PRN

## 2022-08-31 MED ORDER — ALBUTEROL SULFATE (2.5 MG/3ML) 0.083% IN NEBU: 2.5000 mg | INHALATION_SOLUTION | Freq: Four times a day (QID) | RESPIRATORY_TRACT | Status: DC | PRN

## 2022-08-31 MED ORDER — POTASSIUM CHLORIDE CRYS ER 20 MEQ PO TBCR
40.0000 meq | EXTENDED_RELEASE_TABLET | ORAL | Status: AC
Start: 2022-08-31 — End: 2022-08-31
  Administered 2022-08-31: 40 meq via ORAL
  Filled 2022-08-31: qty 2

## 2022-08-31 MED ORDER — OXYCODONE-ACETAMINOPHEN 10-325 MG PO TABS
1.0000 | ORAL_TABLET | ORAL | Status: DC | PRN
Start: 2022-08-31 — End: 2022-08-31

## 2022-08-31 MED ORDER — ONDANSETRON HCL 4 MG/2ML IJ SOLN: 4.0000 mg | Freq: Four times a day (QID) | INTRAMUSCULAR | Status: DC | PRN

## 2022-08-31 MED ORDER — PANTOPRAZOLE SODIUM 40 MG PO TBEC
40.0000 mg | DELAYED_RELEASE_TABLET | Freq: Every day | ORAL | Status: DC
  Administered 2022-08-31: 40 mg via ORAL
  Filled 2022-08-31 (×2): qty 1

## 2022-08-31 MED ORDER — VALBENAZINE TOSYLATE 40 MG PO CAPS
80.0000 mg | ORAL_CAPSULE | Freq: Every day | ORAL | Status: DC
Start: 2022-08-31 — End: 2022-09-01
  Administered 2022-08-31: 80 mg via ORAL
  Filled 2022-08-31 (×2): qty 2

## 2022-08-31 MED ORDER — SODIUM CHLORIDE 0.9% FLUSH
3.0000 mL | Freq: Two times a day (BID) | INTRAVENOUS | Status: DC
  Administered 2022-09-01 – 2022-09-06 (×9): 3 mL via INTRAVENOUS

## 2022-08-31 MED ORDER — INSULIN ASPART 100 UNIT/ML IJ SOLN
0.0000 [IU] | Freq: Three times a day (TID) | INTRAMUSCULAR | Status: DC
  Administered 2022-09-01 (×2): 2 [IU] via SUBCUTANEOUS

## 2022-08-31 MED ORDER — GABAPENTIN 600 MG PO TABS
1200.0000 mg | ORAL_TABLET | Freq: Every day | ORAL | Status: DC
  Administered 2022-08-31: 1200 mg via ORAL
  Filled 2022-08-31: qty 2

## 2022-08-31 MED ORDER — CALCIUM GLUCONATE-NACL 2-0.675 GM/100ML-% IV SOLN
2.0000 g | Freq: Once | INTRAVENOUS | Status: AC
Start: 2022-08-31 — End: 2022-08-31
  Administered 2022-08-31: 2000 mg via INTRAVENOUS
  Filled 2022-08-31: qty 100

## 2022-08-31 NOTE — Discharge Instructions (Signed)
Please head to the emergency room now as you are in need of a higher level of care than we can provide in the urgent care setting. I am concerned that you have a severe infection of the left knee that will required extensive intervention, possible surgical drainage, IV antibiotics and is more than we can do. Head to the hospital now.

## 2022-08-31 NOTE — ED Notes (Signed)
Patient placed in stretcher. Changed into gown and placed on cardiac monitor, pulse ox and blood pressure cuff.

## 2022-08-31 NOTE — ED Provider Notes (Signed)
[facility] - URGENT CARE CENTER  Note:  This document was prepared using Systems analyst and may include unintentional dictation errors.  MRN: [id number] DOB: 04-13-1966  Subjective:   [name] is a 56 y.o. female presenting for 61-monthhistory of persistent and worsening left knee swelling, pain, drainage.  Patient reports that she was seen initially about 2 months ago and had an attempted aspiration procedure.  She states that it failed.  She has continued to worsen since then.  Is not feeling feverish, chills, nausea and worsening pain with redness of her left knee.  Reports that the swelling is worse and is draining.  Patient is a type II diabetic, severely uncontrolled.  She has a history of below the knee amputation for the right lower extremity.  No current facility-administered medications for this encounter.  Current Outpatient Medications:    amoxicillin-clavulanate (AUGMENTIN) 875-125 MG tablet, Take 1 (one) Tablet by mouth two times daily, Disp: 14 tablet, Rfl: 0   bisoprolol (ZEBETA) 5 MG tablet, Take 1 tablet (5 mg total) by mouth daily. (Patient not taking: Reported on 11/04/2021), Disp: 90 tablet, Rfl: 3   Continuous Blood Gluc Receiver (FREESTYLE LIBRE 2 READER) DEVI, Use to check blood sugar daily, Disp: 1 each, Rfl: 0   Continuous Blood Gluc Sensor (FREESTYLE LIBRE 2 SENSOR) MISC, Use to check blood sugar daily, Disp: 2 each, Rfl: 3   Daridorexant HCl (QUVIVIQ) 25 MG TABS, Take 1 Tablet by mouth as directed, Take no more than once at night within 30 minutes of going to bed, with at least 7 hours remaining prior to awakening, Disp: 5 tablet, Rfl: 0   Daridorexant HCl 50 MG TABS, Take 50 mg by mouth at bedtime., Disp: , Rfl:    DULoxetine (CYMBALTA) 30 MG capsule, Take 60 mg by mouth at bedtime., Disp: , Rfl:    Eszopiclone 3 MG TABS, Take 3 mg by mouth at bedtime., Disp: , Rfl:    fenofibrate (TRICOR) 145 MG tablet, Take 1 tablet (145 mg total) by  mouth daily., Disp: 90 tablet, Rfl: 2   furosemide (LASIX) 20 MG tablet, TAKE 1 TABLET (20 MG TOTAL) BY MOUTH DAILY. FOR LEG SWELLING, Disp: 15 tablet, Rfl: 0   gabapentin (NEURONTIN) 600 MG tablet, Take 1,200 mg by mouth at bedtime., Disp: , Rfl:    glucose blood (FREESTYLE TEST STRIPS) test strip, USE TO CHECK BLOOD SUGARS DAILY. DX CODE E11.65 (Patient not taking: Reported on 01/03/2022), Disp: 50 each, Rfl: 3   INGREZZA 80 MG CAPS, Take 80 mg by mouth at bedtime., Disp: , Rfl:    insulin glargine (LANTUS SOLOSTAR) 100 UNIT/ML Solostar Pen, Injuect 10 units daily, if blood sugars are 150-200 only inject 6 units (Patient taking differently: Inject 10 Units into the skin at bedtime.), Disp: 9 mL, Rfl: 3   insulin lispro (HUMALOG KWIKPEN) 100 UNIT/ML KwikPen, Upto 10 U tid, Disp: 15 mL, Rfl: 1   Insulin Pen Needle (BD PEN NEEDLE NANO 2ND GEN) 32G X 4 MM MISC, 1 each by Does not apply route daily. Use to inject insulin daily., Disp: 100 each, Rfl: 1   JARDIANCE 25 MG TABS tablet, TAKE 1 TABLET BY MOUTH EVERY DAY, Disp: 30 tablet, Rfl: 5   KLOR-CON M10 10 MEQ tablet, Take 10 mEq by mouth 2 (two) times daily., Disp: , Rfl:    levothyroxine (SYNTHROID) 25 MCG tablet, TAKE 1 TABLET BY MOUTH EVERY DAY BEFORE BREAKFAST (Patient taking differently: Take 25 mcg by  mouth at bedtime.), Disp: 90 tablet, Rfl: 1   liraglutide (VICTOZA) 18 MG/3ML SOPN, INJECT 1.8 MG UNDER THE SKIN ONCE DAILY, Disp: 9 mL, Rfl: 2   lisinopril (ZESTRIL) 20 MG tablet, Take 1 tablet (20 mg total) by mouth at bedtime., Disp: 30 tablet, Rfl: 1   metFORMIN (GLUCOPHAGE) 850 MG tablet, 2 tablet with a meal, Disp: , Rfl:    metFORMIN (GLUCOPHAGE-XR) 750 MG 24 hr tablet, TAKE 1 TABLET (750 MG TOTAL) BY MOUTH 2 (TWO) TIMES DAILY. (Patient taking differently: Take 1,500 mg by mouth at bedtime.), Disp: 180 tablet, Rfl: 1   metroNIDAZOLE (METROGEL) 1 % gel, SMARTSIG:gel Topical Twice Daily, Disp: , Rfl:    Multiple Vitamins-Minerals (HAIR SKIN AND  NAILS FORMULA PO), Take 2 tablets by mouth in the morning and at bedtime. gummy, Disp: , Rfl:    omeprazole (PRILOSEC) 40 MG capsule, Take 40-80 mg by mouth at bedtime., Disp: , Rfl:    ondansetron (ZOFRAN-ODT) 4 MG disintegrating tablet, 1 tablet on the tongue and allow to dissolve, Disp: , Rfl:    oxyCODONE-acetaminophen (PERCOCET) 10-325 MG tablet, Take 1 tablet by mouth as needed., Disp: , Rfl:    oxyCODONE-acetaminophen (PERCOCET) 10-325 MG tablet, Take 1 Tablet by mouth every 4 to 6 hours as needed for pain, Disp: 180 tablet, Rfl: 0   promethazine (PHENERGAN) 12.5 MG tablet, 1 tablet as needed, Disp: , Rfl:    QUEtiapine (SEROQUEL XR) 400 MG 24 hr tablet, SMARTSIG:2 Tablet(s) By Mouth Every Evening, Disp: , Rfl:    spironolactone (ALDACTONE) 25 MG tablet, Take 1 tablet (25 mg total) by mouth daily., Disp: 90 tablet, Rfl: 3   VRAYLAR 6 MG CAPS, Take 6 mg by mouth at bedtime. , Disp: , Rfl: 1   Allergies  Allergen Reactions   Latex Itching, Rash and Other (See Comments)    Pt states she cannot use condoms - cause an infection.  Use of latex on skin is okay for short period of time.  Tape causes rash   Sweetening Enhancer [Flavoring Agent] Nausea And Vomiting and Other (See Comments)    HEADACHES   Ropinirole     Pt stated, "I am getting cramps in my legs - especially at night"   Aspartame And Phenylalanine Nausea And Vomiting    HEADACHES   Ibuprofen Other (See Comments)    HEADACHES   Trazodone And Nefazodone Other (See Comments)    Hallucinations     Past Medical History:  Diagnosis Date   Anxiety    Arthritis    Bipolar disorder ([place])    Broken jaw (HCC)    Broken wrist    Chronic back pain    Claudication ([place])    a. 12/2013 ABI's: R 0.97, L 0.94.   Colon cancer (HCC)    COPD (chronic obstructive pulmonary disease) (HCC)    Depression    Diabetic Charcot's foot (HCC)    Diabetic foot ulcer ([place])    chronic left foot ulcer , great toe/  hx recurrent foot ulcer  bilaterally   DJD (degenerative joint disease)    Edema of both lower extremities    Episode of memory loss    Family history of bladder cancer    Family history of colon cancer    Family history of kidney cancer    GERD (gastroesophageal reflux disease)    Headache(784.0)    Hiatal hernia    History of colon cancer, stage I dx 02-26-2015--- oncologist-- dr [name]--- per last note no  recurrence   05-16-2015 s/p  Laparoscopic sigmoid colectomy w/ node bx's (negative nodes per path)  Stage I (pT1,N0,M0) Grade 2   History of CVA with residual deficit 02-12-2014  post op cardiac cath.   per MRI multiple small strokes post cardiac cath. --  residual mild memory loss   History of methicillin resistant staphylococcus aureus (MRSA)    Hypercholesterolemia    Hypertension    Insomnia    Insulin dependent type 2 diabetes mellitus, uncontrolled dx 2004   endocrinologist-  dr Dwyane Dee-- last A1c 8.8 in Aug2018:  pt is noncompliant w/ diet, states does note eat breakfast , her first main meal in afternoon   Neurogenic bladder    Neuropathy, diabetic (HCC)    hands and feet   OSA (obstructive sleep apnea)    cpap intolerant   Personality disorder (HCC)    Restless leg syndrome    SOB (shortness of breath) on exertion    Stroke [facility])    SUI (stress urinary incontinence, female) S/P SLING 12-29-2011     Past Surgical History:  Procedure Laterality Date   AMPUTATION Right 04/13/2018   Procedure: RIGHT BELOW KNEE AMPUTATION;  Surgeon: Newt Minion, MD;  Location: [facility];  Service: Orthopedics;  Laterality: Right;   AMPUTATION Left 10/26/2018   Procedure: LEFT FOOT FIRST RAY AMPUTATION;  Surgeon: Newt Minion, MD;  Location: [facility];  Service: Orthopedics;  Laterality: Left;   AMPUTATION TOE Left 05/10/2019   Procedure: AMPUTATION 2nd left TOE WITH METATARSAL;  Surgeon: Landis Martins, DPM;  Location: [facility];  Service: Podiatry;  Laterality: Left;   ANTERIOR CERVICAL DECOMP/DISCECTOMY FUSION  4782    C5 - 7   APPLICATION OF WOUND VAC Left 10/26/2018   Procedure: APPLICATION OF WOUND VAC;  Surgeon: Newt Minion, MD;  Location: [facility];  Service: Orthopedics;  Laterality: Left;   BACK SURGERY     CARDIAC CATHETERIZATION  05-22-2008   DR SKAINS   NO SIGNIFECANT CAD/ NORMAL LV/  EF 65%/  NO WALL MOTION ABNORMALITIES   CARPAL TUNNEL RELEASE Right 04-25-2013   CATARACT EXTRACTION Right 10/24/2018   CESAREAN SECTION  1989; 1992   COLON SURGERY     COLONOSCOPY     CYSTO N/A 04/30/2013   Procedure: CYSTOSCOPY;  Surgeon: Reece Packer, MD;  [facility];  Service: Urology;  Laterality: N/A;   CYSTOSCOPY MACROPLASTIQUE IMPLANT N/A 02/06/2018   Procedure: CYSTOSCOPY MACROPLASTIQUE IMPLANT;  Surgeon: Bjorn Loser, MD;  Location: [facility];  Service: Urology;  Laterality: N/A;   CYSTOSCOPY WITH INJECTION  05/04/2012   Procedure: CYSTOSCOPY WITH INJECTION;  Surgeon: Reece Packer, MD;  Location: [facility];  Service: Urology;  Laterality: N/A;  MACROPLASTIQUE INJECTION   CYSTOSCOPY WITH INJECTION  08/28/2012   Procedure: CYSTOSCOPY WITH INJECTION;  Surgeon: Reece Packer, MD;  Location: [facility];  Service: Urology;  Laterality: N/A;  cysto and macroplastique    FOOT SURGERY Bilateral    "related to [place]"   [place]   "stomach"   I & D EXTREMITY Left 10/25/2018   Procedure: IRRIGATION AND DEBRIDEMENT GREAT TOE;  Surgeon: Meredith Pel, MD;  Location: [facility];  Service: Orthopedics;  Laterality: Left;   INCISION AND DRAINAGE Left 10/25/2018   GREAT TOE   INCISION AND DRAINAGE OF WOUND Right 04/11/2018   Procedure: IRRIGATION AND DEBRIDEMENT WOUND right foot and right ankle;  Surgeon: Evelina Bucy, DPM;  [facility];  Service: Podiatry;  Laterality: Right;   KNEE ARTHROSCOPY Left    KNEE ARTHROSCOPY W/ ALLOGRAFT IMPANT Left    graft x 2   KNEE SURGERY     TOTAL 8 SURG'S   LAPAROSCOPIC SIGMOID COLECTOMY  N/A 04/22/2015   Procedure: LAPAROSCOPIC HAND ASSISTED SIGMOID COLECTOMY;  Surgeon: Erroll Luna, MD;  Location: [facility];  Service: General;  Laterality: N/A;   LEFT HEART CATHETERIZATION WITH CORONARY ANGIOGRAM N/A 02/12/2014   Procedure: LEFT HEART CATHETERIZATION WITH CORONARY ANGIOGRAM;  Surgeon: Candee Furbish, MD;  Location[facility];  Service: Cardiovascular;  Laterality: N/A;   No angiographically significant CAD; normal LVSF, LVEDP 64mHg,  EF 55% (new finding ef 30% myoview 01-08-2014)   LUMBAR FUSION     MANDIBLE FRACTURE SURGERY     MULTIPLE LAPAROSCOPIES FOR ENDOMETRIOSIS     PUBOVAGINAL SLING  12/29/2011   Procedure: PGaynelle Arabian  Surgeon: SReece Packer MD;  [facility]  Service: Urology;  Laterality: N/A;  cysto and sparc sling    PUBOVAGINAL SLING N/A 04/30/2013   Procedure: REMOVAL OF VAGINAL MESH;  Surgeon: SReece Packer MD;  [facility];  Service: Urology;  Laterality: N/A;   RECONSTURCTION OF CONGENITAL UTERUS ANOMALY  1983   REPEAT RECONSTRUCTION ACL LEFT KNEE/ SCREWS REMOVED  03-28-2000   CADAVER GRAFT   TOTAL ABDOMINAL HYSTERECTOMY  1997   w/BSO   TRANSTHORACIC ECHOCARDIOGRAM  02/13/2014   ef 45%, hypokinesis base inferior and base inferolateral walls   UPPER GI ENDOSCOPY     WOUND DEBRIDEMENT Right 09/05/2016   Procedure: DEBRIDEMENT WOUND WITH GRAFT RIGHT FOOT;  Surgeon: TLandis Martins DPM;  [place]  Service: Podiatry;  Laterality: Right;   WRIST FRACTURE SURGERY      Family History  Problem Relation Age of Onset   Hypertension Mother    Diabetes Mother    Cancer - Other Mother        lymphoma    Cancer - Other Father        lung, bladder cancer    Heart attack Father    Other Father        Lynch syndrome   Cancer - Other Brother        bladder cancer    Bladder Cancer Paternal Aunt        Lynch syndrome   Other Paternal Uncle        Lynch syndrome   Other Cousin        Lynch syndrome    Social  History   Tobacco Use   Smoking status: Every Day    Types: E-cigarettes   Smokeless tobacco: Never   Tobacco comments:    per pt started smoking  age 56 Vaping Use   Vaping Use: Every day   Devices: "tried vaping for 1 yr; didn't work out"   Substance Use Topics   Alcohol use: Not Currently    Comment:  "special occasions only"   Drug use: Not Currently    ROS   Objective:   Vitals: BP 138/74   Pulse 86   Temp 98.6 F (37 C)   Resp 18   SpO2 95%   Physical Exam Constitutional:      General: She is not in acute distress.    Appearance: Normal appearance. She is well-developed. She is not ill-appearing, toxic-appearing or diaphoretic.  HENT:     Head: Normocephalic and atraumatic.     Nose: Nose normal.  Mouth/Throat:     Mouth: Mucous membranes are moist.  Eyes:     General: No scleral icterus.       Right eye: No discharge.        Left eye: No discharge.     Extraocular Movements: Extraocular movements intact.  Cardiovascular:     Rate and Rhythm: Normal rate.  Pulmonary:     Effort: Pulmonary effort is normal.  Musculoskeletal:       Legs:  Skin:    General: Skin is warm and dry.  Neurological:     General: No focal deficit present.     Mental Status: She is alert and oriented to person, place, and time.  Psychiatric:        Mood and Affect: Mood normal.        Behavior: Behavior normal.        Assessment and Plan :   PDMP not reviewed this encounter.  1. Infection of left knee (HCC)   2. Pain and swelling of knee, left   3. Type 2 diabetes mellitus treated with insulin ([place])    Patient has a severe infection of the left lower extremity including the knee.  She is in need of a higher level of care than we can provide in the urgent care setting.  I discussed this with patient including the possibility of surgical drainage, IV antibiotics, admission.  Patient and her caregiver verbalized understanding and will head to the hospital now.   Patient is hemodynamically stable and appropriate for transport to the hospital by personal vehicle.   Jaynee Eagles, [place] 08/31/22 1202

## 2022-08-31 NOTE — ED Triage Notes (Signed)
Pt has been dealing with infection to left knee for 2 months and attempted Augmentin but was allergic. Sent from UC. Pt had it aspirated in August. Pt diabetic and has amputation to right lower leg.

## 2022-08-31 NOTE — ED Provider Triage Note (Signed)
Emergency Medicine Provider Triage Evaluation Note  [name] , a 56 y.o. female  was evaluated in triage.  Pt complains of L leg pain and swelling. There is a fluctuant mass below the patella of her L knee. Poorly controlled diabetic w hx of R BKA.   PCP attempted aspiration (unsuccesfully) per pt  Seems this has been present since 3-4 weeks ago. Was trialed on augmentin but could not tolerate GI SE(s)  Review of Systems  Positive: Fluctuant mass Negative: Fever   Physical Exam  BP (!) 160/82 (BP Location: Right Arm)   Pulse 85   Temp 98.9 F (37.2 C) (Oral)   Resp 19   Ht '5\' 5"'$  (1.651 m)   Wt 95.3 kg   SpO2 97%   BMI 34.95 kg/m  Gen:   Awake, no distress   Resp:  Normal effort  MSK:   Moves extremities without difficulty  Other:  Large fluctuant mass prox L prox tibia (below patella). Scant drainage.   Medical Decision Making  Medically screening exam initiated at 1:01 PM.  Appropriate orders placed.  [name] was informed that the remainder of the evaluation will be completed by another provider, this initial triage assessment does not replace that evaluation, and the importance of remaining in the ED until their evaluation is complete.  [address]    [name] [place], [place] 08/31/22 1304

## 2022-08-31 NOTE — H&P (Signed)
History and Physical    Patient: [name] OFB:[id number] DOB: [birth year] DOA: 08/31/2022 DOS: the patient was seen and examined on 08/31/2022 PCP: Emelia Loron, NP  Patient coming from: [place] urgent care via EMS  Chief Complaint:  Chief Complaint  Patient presents with   Wound Infection   HPI: [name] is a 56 y.o. 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  She reports symptoms started approximately 2 months ago with swelling and pain of her left knee.  When symptoms first started she was initially seen by her primary who tried to aspirate the joint, but was unsuccessful.  At that time she was placed on Augmentin, but never completed the course due to it causing her to have upset stomach.  She tried following back up, but was not set up for appointment until September 14.  Over this time patient reported she had developed progressively worsening swelling of the area with redness.  Recently the area had started draining and appear to be infected for which she went to urgent care for further evaluation.  Patient not having any significant fever, but had reported episodes of nausea and vomiting.  From there she was sent here for further evaluation.  Patient at baseline deals with chronic pain related to her back and neuropathy in her hands and feet.  She denies having any blood in her stools.  She previously had cellulitis of the elbow in 2019.   Upon admission into the emergency department patient was noted to be afebrile with stable vital signs.  Labs significant for hemoglobin 9.2 sodium 121, potassium 2.7, BUN 8, creatinine 1.2, glucose 225, anion gap 17, lactic acid 4.8.  X-rays revealed nonspecific soft tissue swelling about the tibial tuberosity without associated fracture or  evidence of osteomyelitis.  The ED provider plan to attempt aspiration, but was unsuccessful.  Orion Crook of orthopedics was consulted.  Blood cultures have been obtained.  Patient was given 1 L lactated Ringer's, vancomycin, and Rocephin.  Review of Systems: As mentioned in the history of present illness. All other systems reviewed and are negative. Past Medical History:  Diagnosis Date   Anxiety    Arthritis    Bipolar disorder ([place])    Broken jaw ([place])    Broken wrist    Chronic back pain    Claudication ([place])    a. 12/2013 ABI's: R 0.97, L 0.94.   Colon cancer ([place])    COPD (chronic obstructive pulmonary disease) (HCC)    Depression    Diabetic Charcot's foot (HCC)    Diabetic foot ulcer ([place])    chronic left foot ulcer , great toe/  hx recurrent foot ulcer bilaterally   DJD (degenerative joint disease)    Edema of both lower extremities    Episode of memory loss    Family history of bladder cancer    Family history of colon cancer    Family history of kidney cancer    GERD (gastroesophageal reflux disease)    Headache(784.0)    Hiatal hernia    History of colon cancer, stage I dx 02-26-2015--- oncologist-- dr [name]--- per last note no recurrence   05-16-2015 s/p  Laparoscopic sigmoid colectomy w/ node bx's (negative nodes per path)  Stage I (pT1,N0,M0) Grade 2   History of CVA with residual deficit 02-12-2014  post op cardiac cath.   per MRI multiple small strokes post cardiac cath. --  residual mild memory loss   History of methicillin resistant staphylococcus aureus (MRSA)    Hypercholesterolemia    Hypertension    Insomnia    Insulin dependent type 2 diabetes mellitus, uncontrolled dx 2004   endocrinologist-  dr Dwyane Dee-- last A1c 8.8 in Aug2018:  pt is noncompliant w/ diet, states does note eat breakfast , her first main meal in afternoon   Neurogenic bladder    Neuropathy, diabetic (HCC)    hands and feet   OSA (obstructive sleep apnea)    cpap intolerant    Personality disorder (HCC)    Restless leg syndrome    SOB (shortness of breath) on exertion    Stroke [facility])    SUI (stress urinary incontinence, female) S/P SLING 12-29-2011   Past Surgical History:  Procedure Laterality Date   AMPUTATION Right 04/13/2018   Procedure: RIGHT BELOW KNEE AMPUTATION;  Surgeon: Newt Minion, MD;  Location: [place];  Service: Orthopedics;  Laterality: Right;   AMPUTATION Left 10/26/2018   Procedure: LEFT FOOT FIRST RAY AMPUTATION;  Surgeon: Newt Minion, MD;  Location: [place];  Service: Orthopedics;  Laterality: Left;   AMPUTATION TOE Left 05/10/2019   Procedure: AMPUTATION 2nd left TOE WITH METATARSAL;  Surgeon: Landis Martins, DPM;  Location: [place];  Service: Podiatry;  Laterality: Left;   ANTERIOR CERVICAL DECOMP/DISCECTOMY FUSION  8850   C5 - 7   APPLICATION OF WOUND VAC Left 10/26/2018   Procedure: APPLICATION OF WOUND VAC;  Surgeon: Newt Minion, MD;  Location: [place];  Service: Orthopedics;  Laterality: Left;   BACK SURGERY     CARDIAC CATHETERIZATION  05-22-2008   DR SKAINS   NO SIGNIFECANT CAD/ NORMAL LV/  EF 65%/  NO WALL MOTION ABNORMALITIES   CARPAL TUNNEL RELEASE Right 04-25-2013   CATARACT EXTRACTION Right 10/24/2018   CESAREAN SECTION  1989; 1992   COLON SURGERY     COLONOSCOPY     CYSTO N/A 04/30/2013   Procedure: CYSTOSCOPY;  Surgeon: Reece Packer, MD;  Location: [facility];  Service: Urology;  Laterality: N/A;   CYSTOSCOPY MACROPLASTIQUE IMPLANT N/A 02/06/2018   Procedure: CYSTOSCOPY MACROPLASTIQUE IMPLANT;  Surgeon: Bjorn Loser, MD;  Location: [facility];  Service: Urology;  Laterality: N/A;   CYSTOSCOPY WITH INJECTION  05/04/2012   Procedure: CYSTOSCOPY WITH INJECTION;  Surgeon: Reece Packer, MD;  Location: [place];  Service: Urology;  Laterality: N/A;  MACROPLASTIQUE INJECTION   CYSTOSCOPY WITH INJECTION  08/28/2012   Procedure: CYSTOSCOPY WITH INJECTION;  Surgeon: Reece Packer, MD;   Location: [place];  Service: Urology;  Laterality: N/A;  cysto and macroplastique    FOOT SURGERY Bilateral    "related to [place]"   [place]   "stomach"   I & D EXTREMITY Left 10/25/2018   Procedure: IRRIGATION AND DEBRIDEMENT GREAT TOE;  Surgeon: Meredith Pel, MD;  Location: [place];  Service: Orthopedics;  Laterality: Left;   INCISION AND DRAINAGE Left 10/25/2018   GREAT TOE   INCISION AND DRAINAGE OF WOUND Right 04/11/2018   Procedure: IRRIGATION AND DEBRIDEMENT WOUND right foot and right ankle;  Surgeon: Evelina Bucy, DPM;  Location: [facility];  Service: Podiatry;  Laterality: Right;   KNEE ARTHROSCOPY Left    KNEE ARTHROSCOPY W/ ALLOGRAFT IMPANT Left    graft x 2   KNEE SURGERY  TOTAL 8 SURG'S   LAPAROSCOPIC SIGMOID COLECTOMY N/A 04/22/2015   Procedure: LAPAROSCOPIC HAND ASSISTED SIGMOID COLECTOMY;  Surgeon: Erroll Luna, MD;  Location: [place];  Service: General;  Laterality: N/A;   LEFT HEART CATHETERIZATION WITH CORONARY ANGIOGRAM N/A 02/12/2014   Procedure: LEFT HEART CATHETERIZATION WITH CORONARY ANGIOGRAM;  Surgeon: Candee Furbish, MD;  Location: [facility] CATH LAB;  Service: Cardiovascular;  Laterality: N/A;   No angiographically significant CAD; normal LVSF, LVEDP 25mHg,  EF 55% (new finding ef 30% myoview 01-08-2014)   LUMBAR FUSION     MANDIBLE FRACTURE SURGERY     MULTIPLE LAPAROSCOPIES FOR ENDOMETRIOSIS     PUBOVAGINAL SLING  12/29/2011   Procedure: PGaynelle Arabian  Surgeon: SReece Packer MD;  [facility]  Service: [facility];  Laterality: N/A;  cysto and sparc sling    PUBOVAGINAL SLING N/A 04/30/2013   Procedure: REMOVAL OF VAGINAL MESH;  Surgeon: SReece Packer MD;  [facility];  Service: Urology;  Laterality: N/A;   RECONSTURCTION OF CONGENITAL UTERUS ANOMALY  1983   REPEAT RECONSTRUCTION ACL LEFT KNEE/ SCREWS REMOVED  03-28-2000   CADAVER GRAFT   TOTAL ABDOMINAL HYSTERECTOMY  1997   w/BSO    TRANSTHORACIC ECHOCARDIOGRAM  02/13/2014   ef 45%, hypokinesis base inferior and base inferolateral walls   UPPER GI ENDOSCOPY     WOUND DEBRIDEMENT Right 09/05/2016   Procedure: DEBRIDEMENT WOUND WITH GRAFT RIGHT FOOT;  Surgeon: TLandis Martins DPM;  [place]  Service: Podiatry;  Laterality: Right;   WRIST FRACTURE SURGERY     Social History:  reports that she has been smoking e-cigarettes. She has never used smokeless tobacco. She reports that she does not currently use alcohol. She reports that she does not currently use drugs.  Allergies  Allergen Reactions   Latex Itching, Rash and Other (See Comments)    Pt states she cannot use condoms - cause an infection.  Use of latex on skin is okay for short period of time.  Tape causes rash   Sweetening Enhancer [Flavoring Agent] Nausea And Vomiting and Other (See Comments)    HEADACHES   Ropinirole     Pt stated, "I am getting cramps in my legs - especially at night"   Aspartame And Phenylalanine Nausea And Vomiting    HEADACHES   Ibuprofen Other (See Comments)    HEADACHES   Trazodone And Nefazodone Other (See Comments)    Hallucinations     Family History  Problem Relation Age of Onset   Hypertension Mother    Diabetes Mother    Cancer - Other Mother        lymphoma    Cancer - Other Father        lung, bladder cancer    Heart attack Father    Other Father        Lynch syndrome   Cancer - Other Brother        bladder cancer    Bladder Cancer Paternal Aunt        Lynch syndrome   Other Paternal Uncle        Lynch syndrome   Other Cousin        Lynch syndrome    Prior to Admission medications   Medication Sig Start Date End Date Taking? Authorizing Provider  amoxicillin-clavulanate (AUGMENTIN) 875-125 MG tablet Take 1 (one) Tablet by mouth two times daily 08/08/22     bisoprolol (ZEBETA) 5 MG tablet Take 1 tablet (5 mg total) by  mouth daily. Patient not taking: Reported on 11/04/2021 01/29/21   Nahser, Wonda Cheng, MD   Continuous Blood Gluc Receiver (FREESTYLE LIBRE 2 READER) DEVI Use to check blood sugar daily 05/17/22   Elayne Snare, MD  Continuous Blood Gluc Sensor (FREESTYLE LIBRE 2 SENSOR) MISC Use to check blood sugar daily 03/03/22   Elayne Snare, MD  Daridorexant HCl (QUVIVIQ) 25 MG TABS Take 1 Tablet by mouth as directed, Take no more than once at night within 30 minutes of going to bed, with at least 7 hours remaining prior to awakening 07/03/22     Daridorexant HCl 50 MG TABS Take 50 mg by mouth at bedtime.    [provider]  DULoxetine (CYMBALTA) 30 MG capsule Take 60 mg by mouth at bedtime. 01/09/21   [provider]  Eszopiclone 3 MG TABS Take 3 mg by mouth at bedtime.    [provider]  fenofibrate (TRICOR) 145 MG tablet Take 1 tablet (145 mg total) by mouth daily. 02/15/22   Elayne Snare, MD  furosemide (LASIX) 20 MG tablet TAKE 1 TABLET (20 MG TOTAL) BY MOUTH DAILY. FOR LEG SWELLING 05/25/22   Elayne Snare, MD  gabapentin (NEURONTIN) 600 MG tablet Take 1,200 mg by mouth at bedtime.    [provider]  glucose blood (FREESTYLE TEST STRIPS) test strip USE TO CHECK BLOOD SUGARS DAILY. DX CODE E11.65 Patient not taking: Reported on 01/03/2022 08/09/18   Elayne Snare, MD  [facility] 80 MG CAPS Take 80 mg by mouth at bedtime. 05/01/18   [provider]  insulin glargine (LANTUS SOLOSTAR) 100 UNIT/ML Solostar Pen Injuect 10 units daily, if blood sugars are 150-200 only inject 6 units Patient taking differently: Inject 10 Units into the skin at bedtime. 09/10/21   Elayne Snare, MD  insulin lispro (HUMALOG KWIKPEN) 100 UNIT/ML KwikPen Upto 10 U tid 02/14/22   Elayne Snare, MD  Insulin Pen Needle (BD PEN NEEDLE NANO 2ND GEN) 32G X 4 MM MISC 1 each by Does not apply route daily. Use to inject insulin daily. 01/01/20   Elayne Snare, MD  JARDIANCE 25 MG TABS tablet TAKE 1 TABLET BY MOUTH EVERY DAY 04/11/22   Elayne Snare, MD  KLOR-CON M10 10 MEQ tablet Take 10 mEq by mouth 2 (two) times  daily. 01/03/22   [provider]  levothyroxine (SYNTHROID) 25 MCG tablet TAKE 1 TABLET BY MOUTH EVERY DAY BEFORE BREAKFAST Patient taking differently: Take 25 mcg by mouth at bedtime. 09/28/21   Elayne Snare, MD  liraglutide (VICTOZA) 18 MG/3ML SOPN INJECT 1.8 MG UNDER THE SKIN ONCE DAILY 03/15/22   Elayne Snare, MD  lisinopril (ZESTRIL) 20 MG tablet Take 1 tablet (20 mg total) by mouth at bedtime. 01/07/22   Raspet, Derry Skill, PA-C  metFORMIN (GLUCOPHAGE) 850 MG tablet 2 tablet with a meal    [provider]  metFORMIN (GLUCOPHAGE-XR) 750 MG 24 hr tablet TAKE 1 TABLET (750 MG TOTAL) BY MOUTH 2 (TWO) TIMES DAILY. Patient taking differently: Take 1,500 mg by mouth at bedtime. 03/17/21   Elayne Snare, MD  metroNIDAZOLE (METROGEL) 1 % gel SMARTSIG:gel Topical Twice Daily 02/24/22   [provider]  Multiple Vitamins-Minerals (HAIR SKIN AND NAILS FORMULA PO) Take 2 tablets by mouth in the morning and at bedtime. gummy    [provider]  omeprazole (PRILOSEC) 40 MG capsule Take 40-80 mg by mouth at bedtime.    [provider]  ondansetron (ZOFRAN-ODT) 4 MG disintegrating tablet 1 tablet on the  tongue and allow to dissolve 07/27/21   [provider]  oxyCODONE-acetaminophen (PERCOCET) 10-325 MG tablet Take 1 tablet by mouth as needed. 02/27/22   [provider]  oxyCODONE-acetaminophen (PERCOCET) 10-325 MG tablet Take 1 Tablet by mouth every 4 to 6 hours as needed for pain 08/08/22     promethazine (PHENERGAN) 12.5 MG tablet 1 tablet as needed 07/28/21   [provider]  QUEtiapine (SEROQUEL XR) 400 MG 24 hr tablet SMARTSIG:2 Tablet(s) By Mouth Every Evening 01/10/22   [provider]  spironolactone (ALDACTONE) 25 MG tablet Take 1 tablet (25 mg total) by mouth daily. 02/14/22   Elayne Snare, MD  VRAYLAR 6 MG CAPS Take 6 mg by mouth at bedtime.  05/31/18   [provider]    Physical Exam: Vitals:   08/31/22 1413 08/31/22 1430  08/31/22 1500 08/31/22 1530  BP: 99/63 117/76 108/68 134/79  Pulse: 96 85 86 88  Resp: _0 Temp:      TempSrc:      SpO2: 100% 97% 97% 98%  Weight:      Height:        Constitutional: Middle aged female who appears to be in some distress Eyes: PERRL, lids and conjunctivae normal ENMT: Mucous membranes are moist.   Neck: normal, supple  Respiratory: clear to auscultation bilaterally, no wheezing, no crackles.   Cardiovascular: Regular rate and rhythm, no murmurs / rubs / gallops. No extremity edema.   Abdomen: no tenderness, no masses palpated. No hepatosplenomegaly. Bowel sounds positive.  Musculoskeletal: no clubbing / cyanosis.  Right below-knee amputation.  Amputation of the first and second left foot. Skin: no rashes, lesions, ulcers. No induration Neurologic: CN 2-12 grossly intact.   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 Psychiatric:  Alert and oriented x 3. Normal mood.   Data Reviewed:  Reviewed labs, imaging, and pertinent records as noted above in HPI.  Assessment and Plan: Cellulitis with concern for possible septic infrapatellar bursitis Patient presents with complaints of redness and swelling right knee over the last 2 months.  X-rays revealed soft tissue swelling without signs of osteomyelitis.  Patient has been ordered Rocephin and vancomycin, but developed rash on Rocephin for which it was stopped.  ED provider attempted aspiration of the knee joint without success. -Admit to a telemetry bed -Follow-up blood cultures -Add-on ESR and CRP -Continue empiric antibiotics of Vancomycin -Discontinued Rocephin due to rash, and replaced with Unasyn for anaerobic coverage -Oxycodone/Dilaudid as needed for moderate to severe pain respectively. Orion Crook of orthopedics consulted, they will follow-up and see in a.m.  Lactic acidosis  Acute.  Lactic acid elevated at 4.8-> 3.4.  -Continue IV fluids -Continue to trend lactic acid level  Normocytic  anemia Acute.  Hemoglobin 9.2 with repeat check 9.9.  Vital signs are currently stable.  Hemoglobin previously had been 13.7 in January.  Patient denies any reports of bleeding. -Type and screen for possible need of blood products -Continue to monitor H&H -Consider need to work-up for other sources of bleeding.  Hyponatremia Acute.  Initial sodium 121.  Suspect likely hypovolemic hyponatremia.  Question patient's medications as also secondary cause. -Check urine sodium and urine osmolarity -0.45% normal saline IV fluids with 40 meq of KCl at 100 mL/h x 1 L. -Serial monitoring of BMPs -Goal correction no more than 10 mmol/L in  24-hour.  Adjust IV fluids as needed  Hypokalemia Acute.  Potassium initially noted to be 2.7.  Patient reported episodes of  nausea and vomiting along with being on a diuretic. -Check magnesium level -Give potassium chloride 40 mEq p.o. -Recheck potassium levels in a.m.  Hypocalcemia Acute.  Calcium 8 with ionized calcium 0.9. -Give 2 g of calcium gluconate -Continue to monitor and replace as needed  Nausea and vomiting Prior to arrival. -Antiemetics as needed  Essential hypertension Blood pressures anywhere from 99/63 to 160/82. -Continue bisoprolol and lisinopril -Held furosemide and spironolactone due to hyponatremia -Avoided any diuretic at this time due to renal insufficiency  Uncontrolled diabetes mellitus type 2 with neuropathy On admission glucose elevated at 225. -Hypoglycemic protocols -Check hemoglobin A1c -Hold metformin -Semglee 6 units nightly -CBGs before every meal with moderate SSI -Continue gabapentin -Adjust insulin regimen as needed  Renal insufficiency/possible acute kidney injury Patient presents with creatinine of 1.2.  Baseline creatinine previously noted to be 0.92 on 02/10/2022.  This is not greater than 0.3 increased from previous baseline to suggest acute kidney injury at this time. -Follow-up urinalysis -IV fluids as  noted above -Recheck kidney function in a.m.  Depression and anxiety -Resume home medication regimen once verified.   -Pharmacy consult to evaluate current regimen  Hypothyroidism Last TSH 2.77 on 02/10/2022. -Check TSH -Continue levothyroxine  Chronic pain -Continue oxycodone as needed for pain  PAD -Continue statin  History of colon cancer Status post resection in 2016.  Patient denies any reports of bleeding.  Nicotine use Patient currently uses electronic cigarettes  GERD -Continue Protonix  DVT prophylaxis: SCDs Advance Care Planning:   Code Status: Full Code    Consults: Orthopedic  Family Communication: Sister updated at bedside  Severity of Illness: The appropriate patient status for this patient is INPATIENT. Inpatient status is judged to be reasonable and necessary in order to provide the required intensity of service to ensure the patient's safety. The patient's presenting symptoms, physical exam findings, and initial radiographic and laboratory data in the context of their chronic comorbidities is felt to place them at high risk for further clinical deterioration. Furthermore, it is not anticipated that the patient will be medically stable for discharge from the hospital within 2 midnights of admission.   * I certify that at the point of admission it is my clinical judgment that the patient will require inpatient hospital care spanning beyond 2 midnights from the point of admission due to high intensity of service, high risk for further deterioration and high frequency of surveillance required.*  Author: Norval Morton, MD 08/31/2022 4:20 PM  For on call review [email or web address].

## 2022-08-31 NOTE — ED Triage Notes (Signed)
Pt here with large lump below left knee x 2 months. Pt states that she was put on Augmentin for it in August but was allergic to it, but pt states she just had GI issues with it.

## 2022-08-31 NOTE — ED Provider Notes (Signed)
[facility] EMERGENCY DEPARTMENT Provider Note   CSN: [id number] Arrival date & time: 08/31/22  1239     History  Chief Complaint  Patient presents with   Wound Infection    [name] is a 56 y.o. female.  HPI Patient reports has had a persistent and worsening swelling and redness below her left knee.  Patient is diabetic and has had a number of diabetic complications including poorly controlled diabetes and history of right BKA.  She reports that she crawls around on her knees to get around her apartment because her prosthesis apparently does not fit well on the right lower extremity.  Thusly, she is probably frequently applying pressure to the right pretibial surface.  Patient reports that about 3 weeks ago her doctor tried to aspirate and draw fluid out of the swollen red area but nothing came out.  She was treated with Augmentin but there was no improvement.  Reports she continues to feel generally weak and unwell.  She and her sister at bedside reports the patient chronically has some shaking tremors that are present at this time.  No documented fever but she suspects fever and gets chills.    Home Medications Prior to Admission medications   Medication Sig Start Date End Date Taking? Authorizing Provider  amoxicillin-clavulanate (AUGMENTIN) 875-125 MG tablet Take 1 (one) Tablet by mouth two times daily 08/08/22     bisoprolol (ZEBETA) 5 MG tablet Take 1 tablet (5 mg total) by mouth daily. Patient not taking: Reported on 11/04/2021 01/29/21   Nahser, Wonda Cheng, MD  Continuous Blood Gluc Receiver (FREESTYLE LIBRE 2 READER) DEVI Use to check blood sugar daily 05/17/22   Elayne Snare, MD  Continuous Blood Gluc Sensor (FREESTYLE LIBRE 2 SENSOR) MISC Use to check blood sugar daily 03/03/22   Elayne Snare, MD  Daridorexant HCl (QUVIVIQ) 25 MG TABS Take 1 Tablet by mouth as directed, Take no more than once at night within 30 minutes of going to bed, with at least 7 hours  remaining prior to awakening 07/03/22     Daridorexant HCl 50 MG TABS Take 50 mg by mouth at bedtime.    [provider]  DULoxetine (CYMBALTA) 30 MG capsule Take 60 mg by mouth at bedtime. 01/09/21   [provider]  Eszopiclone 3 MG TABS Take 3 mg by mouth at bedtime.    [provider]  fenofibrate (TRICOR) 145 MG tablet Take 1 tablet (145 mg total) by mouth daily. 02/15/22   Elayne Snare, MD  furosemide (LASIX) 20 MG tablet TAKE 1 TABLET (20 MG TOTAL) BY MOUTH DAILY. FOR LEG SWELLING 05/25/22   Elayne Snare, MD  gabapentin (NEURONTIN) 600 MG tablet Take 1,200 mg by mouth at bedtime.    [provider]  glucose blood (FREESTYLE TEST STRIPS) test strip USE TO CHECK BLOOD SUGARS DAILY. DX CODE E11.65 Patient not taking: Reported on 01/03/2022 08/09/18   Elayne Snare, MD  [employer] 80 MG CAPS Take 80 mg by mouth at bedtime. 05/01/18   [provider]  insulin glargine (LANTUS SOLOSTAR) 100 UNIT/ML Solostar Pen Injuect 10 units daily, if blood sugars are 150-200 only inject 6 units Patient taking differently: Inject 10 Units into the skin at bedtime. 09/10/21   Elayne Snare, MD  insulin lispro (HUMALOG KWIKPEN) 100 UNIT/ML KwikPen Upto 10 U tid 02/14/22   Elayne Snare, MD  Insulin Pen Needle (BD PEN NEEDLE NANO 2ND GEN) 32G X 4 MM MISC 1 each by Does  not apply route daily. Use to inject insulin daily. 01/01/20   Elayne Snare, MD  JARDIANCE 25 MG TABS tablet TAKE 1 TABLET BY MOUTH EVERY DAY 04/11/22   Elayne Snare, MD  KLOR-CON M10 10 MEQ tablet Take 10 mEq by mouth 2 (two) times daily. 01/03/22   [provider]  levothyroxine (SYNTHROID) 25 MCG tablet TAKE 1 TABLET BY MOUTH EVERY DAY BEFORE BREAKFAST Patient taking differently: Take 25 mcg by mouth at bedtime. 09/28/21   Elayne Snare, MD  liraglutide (VICTOZA) 18 MG/3ML SOPN INJECT 1.8 MG UNDER THE SKIN ONCE DAILY 03/15/22   Elayne Snare, MD  lisinopril (ZESTRIL) 20 MG tablet Take 1 tablet (20 mg total) by mouth at  bedtime. 01/07/22   Raspet, Derry Skill, PA-C  lisinopril-hydrochlorothiazide (ZESTORETIC) 20-12.5 MG tablet Take 1 tablet by mouth daily. 06/03/22   [provider]  losartan-hydrochlorothiazide (HYZAAR) 50-12.5 MG tablet Take 1 tablet by mouth daily. 06/07/22   [provider]  metFORMIN (GLUCOPHAGE) 850 MG tablet 2 tablet with a meal    [provider]  metFORMIN (GLUCOPHAGE-XR) 750 MG 24 hr tablet TAKE 1 TABLET (750 MG TOTAL) BY MOUTH 2 (TWO) TIMES DAILY. Patient taking differently: Take 1,500 mg by mouth at bedtime. 03/17/21   Elayne Snare, MD  metroNIDAZOLE (METROGEL) 1 % gel SMARTSIG:gel Topical Twice Daily 02/24/22   [provider]  Multiple Vitamins-Minerals (HAIR SKIN AND NAILS FORMULA PO) Take 2 tablets by mouth in the morning and at bedtime. gummy    [provider]  omeprazole (PRILOSEC) 40 MG capsule Take 40-80 mg by mouth at bedtime.    [provider]  ondansetron (ZOFRAN-ODT) 4 MG disintegrating tablet 1 tablet on the tongue and allow to dissolve 07/27/21   [provider]  oxyCODONE-acetaminophen (PERCOCET) 10-325 MG tablet Take 1 tablet by mouth as needed. 02/27/22   [provider]  oxyCODONE-acetaminophen (PERCOCET) 10-325 MG tablet Take 1 Tablet by mouth every 4 to 6 hours as needed for pain 08/08/22     promethazine (PHENERGAN) 12.5 MG tablet 1 tablet as needed 07/28/21   [provider]  QUEtiapine (SEROQUEL XR) 400 MG 24 hr tablet SMARTSIG:2 Tablet(s) By Mouth Every Evening 01/10/22   [provider]  spironolactone (ALDACTONE) 25 MG tablet Take 1 tablet (25 mg total) by mouth daily. 02/14/22   Elayne Snare, MD  VRAYLAR 6 MG CAPS Take 6 mg by mouth at bedtime.  05/31/18   [provider]      Allergies    Latex, Sweetening enhancer [flavoring agent], Ropinirole, Aspartame and phenylalanine, Ibuprofen, and Trazodone and nefazodone    Review of Systems   Review of Systems  Physical Exam Updated  Vital Signs BP 134/79   Pulse 88   Temp 98.9 F (37.2 C) (Oral)   Resp 20   Ht '5\' 5"'$  (1.651 m)   Wt 95.3 kg   SpO2 98%   BMI 34.95 kg/m  Physical Exam Constitutional:      Comments: Patient is alert and nontoxic.  No respiratory distress at rest.  Deconditioned.  HENT:     Mouth/Throat:     Pharynx: Oropharynx is clear.  Eyes:     Extraocular Movements: Extraocular movements intact.  Cardiovascular:     Rate and Rhythm: Normal rate and regular rhythm.  Pulmonary:     Effort: Pulmonary effort is normal.     Breath sounds: Normal breath sounds.  Abdominal:     General: There is no distension.  Palpations: Abdomen is soft.     Tenderness: There is no abdominal tenderness. There is no guarding.  Musculoskeletal:     Comments: Right BKA well-healed.  Left leg has a approximately 5 to 6 cm erythematous swelling over the tibial tuberosity.  This is erythematous and warm.  Boggy.  1 small point of possible drainage.  Skin:    General: Skin is warm and dry.  Neurological:     Comments: Patient is alert and oriented x3.  She does follow commands appropriately.  She has a constant shaking or tremoring of her legs that she reports is baseline for her.         ED Results / Procedures / Treatments   Labs (all labs ordered are listed, but only abnormal results are displayed) Labs Reviewed  LACTIC ACID, PLASMA - Abnormal; Notable for the following components:      Result Value   Lactic Acid, Venous 4.8 (*)    All other components within normal limits  CBC - Abnormal; Notable for the following components:   RBC 3.04 (*)    Hemoglobin 9.2 (*)    HCT 25.4 (*)    MCHC 36.2 (*)    All other components within normal limits  COMPREHENSIVE METABOLIC PANEL - Abnormal; Notable for the following components:   Sodium 121 (*)    Potassium 2.7 (*)    Chloride 79 (*)    Glucose, Bld 225 (*)    Creatinine, Ser 1.20 (*)    Calcium 8.0 (*)    Total Protein 5.9 (*)    Albumin 3.4 (*)     GFR, Estimated 53 (*)    Anion gap 17 (*)    All other components within normal limits  I-STAT VENOUS BLOOD GAS, ED - Abnormal; Notable for the following components:   pH, Ven 7.592 (*)    pCO2, Ven 32.1 (*)    pO2, Ven 58 (*)    Bicarbonate 31.0 (*)    Acid-Base Excess 9.0 (*)    Sodium 120 (*)    Potassium 2.8 (*)    Calcium, Ion 0.90 (*)    HCT 29.0 (*)    Hemoglobin 9.9 (*)    All other components within normal limits  CULTURE, BLOOD (ROUTINE X 2)  CULTURE, BLOOD (ROUTINE X 2)  SARS CORONAVIRUS 2 BY RT PCR  AEROBIC/ANAEROBIC CULTURE W GRAM STAIN (SURGICAL/DEEP WOUND)  LACTIC ACID, PLASMA  URINALYSIS, ROUTINE W REFLEX MICROSCOPIC  RAPID URINE DRUG SCREEN, HOSP PERFORMED  SEDIMENTATION RATE  C-REACTIVE PROTEIN  HIV ANTIBODY (ROUTINE TESTING W REFLEX)  SODIUM, URINE, RANDOM  OSMOLALITY, URINE  MAGNESIUM  BASIC METABOLIC PANEL  HEMOGLOBIN AND HEMATOCRIT, BLOOD  CBC  TYPE AND SCREEN    EKG None  Radiology DG Knee 2 Views Left  Result Date: 08/31/2022 CLINICAL DATA:  Large fluctuant mass involving the proximal aspect of the left tibia. EXAM: LEFT KNEE - 1-2 VIEW COMPARISON:  01/20/2020 FINDINGS: Focal soft tissue swelling about the tibial tuberosity without associated fracture or radiopaque foreign body. No discrete areas of osteolysis to suggest osteomyelitis. No subcutaneous emphysema. No patella baja or Alta deformity. Stable sequela of previous ACL repair without evidence of loosening of the radiopaque components. 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 Several loose bodies are noted about the posterior aspect of the knee joint space though discrete donor sites are not identified. Enthesopathic change involving the superior and inferior poles of  the patella. IMPRESSION: 1. Nonspecific focal soft tissue swelling about the tibial tuberosity without  associated fracture, radiopaque foreign body radiographic evidence of osteomyelitis. 2. Stable sequela of previous ACL repair without evidence of loosening of the radiopaque components. 3. 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 Electronically Signed   By: Sandi Mariscal M.D.   On: 08/31/2022 13:43    Procedures .Marland KitchenIncision and Drainage  Date/Time: 08/31/2022 5:09 PM  Performed by: Charlesetta Shanks, MD Authorized by: Charlesetta Shanks, MD   Consent:    Consent obtained:  Verbal   Consent given by:  Patient   Risks discussed:  Bleeding, incomplete drainage, pain and infection Universal protocol:    Patient identity confirmed:  Verbally with patient Location:    Type:  Bursa   Size:  5cm   Location:  Lower extremity   Lower extremity location:  Knee   Knee location:  L knee Pre-procedure details:    Skin preparation:  Povidone-iodine Sedation:    Sedation type:  None Anesthesia:    Anesthesia method:  Local infiltration   Local anesthetic:  Lidocaine 1% w/o epi Procedure type:    Complexity:  Complex Procedure details:    Ultrasound guidance: no     Needle aspiration: no     Incision types:  Single straight   Incision depth:  Fascia   Wound management:  Probed and deloculated   Drainage:  Bloody   Packing materials:  1/4 in iodoform gauze   Amount 1/4" iodoform:  15 Post-procedure details:    Procedure completion:  Tolerated well, no immediate complications Comments:     Prep and drape.  Anesthesia with 1% lidocaine.  Straight incision done in the vertical plane.  Incision approximately 2 cm in length and 1 cm in depth.  Probed with sterile hemostat to deloculated.  There was no purulent or flocculent material.  Only normal bleeding and boggy tissue.  Packed with iodoform gauze approximately 10 to 15 cm.  Extensively covered and taped with sterile dressing.     Medications Ordered in ED Medications  cefTRIAXone (ROCEPHIN) 2  g in sodium chloride 0.9 % 100 mL IVPB (has no administration in time range)  lidocaine (PF) (XYLOCAINE) 1 % injection 2 mL (has no administration in time range)  vancomycin (VANCOREADY) IVPB 1750 mg/350 mL (has no administration in time range)  vancomycin (VANCOCIN) IVPB 1000 mg/200 mL premix (has no administration in time range)  sodium chloride flush (NS) 0.9 % injection 3 mL (has no administration in time range)  acetaminophen (TYLENOL) tablet 650 mg (has no administration in time range)    Or  acetaminophen (TYLENOL) suppository 650 mg (has no administration in time range)  ondansetron (ZOFRAN) tablet 4 mg (has no administration in time range)    Or  ondansetron (ZOFRAN) injection 4 mg (has no administration in time range)  albuterol (PROVENTIL) (2.5 MG/3ML) 0.083% nebulizer solution 2.5 mg (has no administration in time range)  potassium chloride SA (KLOR-CON M) CR tablet 40 mEq (has no administration in time range)  sodium chloride 0.45 % 1,000 mL with potassium chloride 40 mEq infusion (has no administration in time range)  HYDROmorphone (DILAUDID) injection 1 mg (has no administration in time range)  lactated ringers bolus 1,000 mL (1,000 mLs Intravenous New Bag/Given 08/31/22 1655)    ED Course/ Medical Decision Making/ A&P  Medical Decision Making Amount and/or Complexity of Data Reviewed Labs: ordered.  Risk Prescription drug management. Decision regarding hospitalization.  Patient presents with history of poorly controlled diabetes.  She reports feeling generally ill but has not had documented fever.  She has had a nonhealing, erythematous swelling inferior to her knee despite outpatient treatment.  On exam, the area is boggy and suspicious for septic bursitis of the pretibial bursa.  Patient reports that frequently at home she does not put on her prosthesis and gets around her home by crawling on her knees.  This is a likely explanation for chronic  bursitis with probable secondary infection.  We will proceed with diagnostic lab work including blood cultures and lactic acid for possible early sepsis.  At this time patient does not have low blood pressures, fever or toxic appearance.  Will not empirically start sepsis order set with antibiotics.  Labs and x-rays reviewed.  2 view knee x-ray reviewed by radiology does not show any osteomyelitis but does have arthritic changes.  No gas present.  Patient does have leukocytosis but no elevation in lactic acid.  Blood pressures are stable.   Consult: Orthopedic surgery Orion Crook.  Recommends incision and drainage in the emergency department.  Attempt to get drainage and if none present, pack the wound and orthopedic surgery will likely do more extensive evacuation and dissection tomorrow.  17:10 Bursa was boggy but not significantly fluctuant.  With incision and drainage, no drainage material present.  Clean, fresh appearing blood with no old hematoma.  Also attempted to deloculated with sterile hemostats but no pockets with drainage.  Packed with iodoform gauze and Sterile dressing.  Culture obtained a sterile red top swabs placed deeply within the wound.  Antibiotics initiated after incision and drainage.  Consult: Dr. Tamala Julian [facility] hospitalist for admission.        Final Clinical Impression(s) / ED Diagnoses Final diagnoses:  Cellulitis of left lower extremity  Septic infrapatellar bursitis of left knee  Severe comorbid illness    Rx / DC Orders ED Discharge Orders     None         Charlesetta Shanks, MD 09/02/22 [id number]

## 2022-08-31 NOTE — Progress Notes (Addendum)
Pharmacy Antibiotic Note  [name] is a 56 y.o. female admitted on 08/31/2022 with  wound infection .  Pharmacy has been consulted for vancomycin dosing. Patient presented with large lump below knee that she was treated for with Augmentin. Patient did not finish the Augmentin due to GI issues. WBC WNL, lactate elevated 4.8, afebrile, and patient's Scr elevated from baseline at 1.2 (bl~0.8-0.9).  Plan: Vancomycin '1750mg'$  IV once  Vancomycin '1000mg'$   IV every 24 hours.  Goal trough 10-15 mcg/mL. (eAUC 488, Vd 0.5) Ceftriaxone 2g once managed by MD Monitor: kidney function, cultures, clinical improvement   Height: '5\' 5"'$  (165.1 cm) Weight: 95.3 kg (210 lb) IBW/kg (Calculated) : 57  Temp (24hrs), Avg:98.8 F (37.1 C), Min:98.6 F (37 C), Max:98.9 F (37.2 C)  Recent Labs  Lab 08/31/22 1300  WBC 8.5  CREATININE 1.20*  LATICACIDVEN 4.8*    Estimated Creatinine Clearance: 59 mL/min (A) (by C-G formula based on SCr of 1.2 mg/dL (H)).    Allergies  Allergen Reactions   Latex Itching, Rash and Other (See Comments)    Pt states she cannot use condoms - cause an infection.  Use of latex on skin is okay for short period of time.  Tape causes rash   Sweetening Enhancer [Flavoring Agent] Nausea And Vomiting and Other (See Comments)    HEADACHES   Ropinirole     Pt stated, "I am getting cramps in my legs - especially at night"   Aspartame And Phenylalanine Nausea And Vomiting    HEADACHES   Ibuprofen Other (See Comments)    HEADACHES   Trazodone And Nefazodone Other (See Comments)    Hallucinations     Antimicrobials this admission: 9/6 CTX >>  9/6 Vanc >>   Microbiology results: 9/6 BCx: pending 9/6 Wound Cx: pending   Thank you for allowing pharmacy to be a part of this patient's care.  Sandford Craze, PharmD. [facility] Acute Care PGY-1 08/31/2022 3:54 PM

## 2022-09-01 ENCOUNTER — Inpatient Hospital Stay (HOSPITAL_COMMUNITY): Payer: 59

## 2022-09-01 DIAGNOSIS — M009 Pyogenic arthritis, unspecified: Secondary | ICD-10-CM | POA: Diagnosis present

## 2022-09-01 DIAGNOSIS — L03116 Cellulitis of left lower limb: Secondary | ICD-10-CM | POA: Diagnosis not present

## 2022-09-01 LAB — COMPREHENSIVE METABOLIC PANEL
ALT: 16 U/L (ref 0–44)
AST: 20 U/L (ref 15–41)
Albumin: 3 g/dL — ABNORMAL LOW (ref 3.5–5.0)
Alkaline Phosphatase: 51 U/L (ref 38–126)
Anion gap: 15 (ref 5–15)
BUN: 11 mg/dL (ref 6–20)
CO2: 26 mmol/L (ref 22–32)
Calcium: 7.8 mg/dL — ABNORMAL LOW (ref 8.9–10.3)
Chloride: 84 mmol/L — ABNORMAL LOW (ref 98–111)
Creatinine, Ser: 1.31 mg/dL — ABNORMAL HIGH (ref 0.44–1.00)
GFR, Estimated: 48 mL/min — ABNORMAL LOW (ref >60)
Glucose, Bld: 132 mg/dL — ABNORMAL HIGH (ref 70–99)
Potassium: 3 mmol/L — ABNORMAL LOW (ref 3.5–5.1)
Sodium: 125 mmol/L — ABNORMAL LOW (ref 135–145)
Total Bilirubin: 0.4 mg/dL (ref 0.3–1.2)
Total Protein: 5.4 g/dL — ABNORMAL LOW (ref 6.5–8.1)

## 2022-09-01 LAB — TSH: TSH: 1.742 u[IU]/mL (ref 0.350–4.500)

## 2022-09-01 LAB — PHOSPHORUS: Phosphorus: 3.6 mg/dL (ref 2.5–4.6)

## 2022-09-01 LAB — GLUCOSE, CAPILLARY
Glucose-Capillary: 119 mg/dL — ABNORMAL HIGH (ref 70–99)
Glucose-Capillary: 193 mg/dL — ABNORMAL HIGH (ref 70–99)
Glucose-Capillary: 218 mg/dL — ABNORMAL HIGH (ref 70–99)

## 2022-09-01 LAB — CBC
HCT: 24.2 % — ABNORMAL LOW (ref 36.0–46.0)
Hemoglobin: 8.6 g/dL — ABNORMAL LOW (ref 12.0–15.0)
MCH: 30.5 pg (ref 26.0–34.0)
MCHC: 35.5 g/dL (ref 30.0–36.0)
MCV: 85.8 fL (ref 80.0–100.0)
Platelets: 307 10*3/uL (ref 150–400)
RBC: 2.82 MIL/uL — ABNORMAL LOW (ref 3.87–5.11)
RDW: 12.3 % (ref 11.5–15.5)
WBC: 14.7 10*3/uL — ABNORMAL HIGH (ref 4.0–10.5)
nRBC: 0 % (ref 0.0–0.2)

## 2022-09-01 LAB — BASIC METABOLIC PANEL
Anion gap: 12 (ref 5–15)
BUN: 11 mg/dL (ref 6–20)
CO2: 28 mmol/L (ref 22–32)
Calcium: 7.8 mg/dL — ABNORMAL LOW (ref 8.9–10.3)
Chloride: 87 mmol/L — ABNORMAL LOW (ref 98–111)
Creatinine, Ser: 1.27 mg/dL — ABNORMAL HIGH (ref 0.44–1.00)
GFR, Estimated: 49 mL/min — ABNORMAL LOW (ref >60)
Glucose, Bld: 147 mg/dL — ABNORMAL HIGH (ref 70–99)
Potassium: 3.3 mmol/L — ABNORMAL LOW (ref 3.5–5.1)
Sodium: 127 mmol/L — ABNORMAL LOW (ref 135–145)

## 2022-09-01 LAB — CBG MONITORING, ED
Glucose-Capillary: 138 mg/dL — ABNORMAL HIGH (ref 70–99)
Glucose-Capillary: 128 mg/dL — ABNORMAL HIGH (ref 70–99)

## 2022-09-01 LAB — LACTIC ACID, PLASMA
Lactic Acid, Venous: 1.1 mmol/L (ref 0.5–1.9)
Lactic Acid, Venous: 2.2 mmol/L (ref 0.5–1.9)

## 2022-09-01 LAB — T4, FREE: Free T4: 0.98 ng/dL (ref 0.61–1.12)

## 2022-09-01 LAB — VITAMIN B12: Vitamin B-12: 188 pg/mL (ref 180–914)

## 2022-09-01 LAB — AMMONIA: Ammonia: 19 umol/L (ref 9–35)

## 2022-09-01 LAB — MAGNESIUM: Magnesium: 1.4 mg/dL — ABNORMAL LOW (ref 1.7–2.4)

## 2022-09-01 MED ORDER — POTASSIUM CHLORIDE 10 MEQ/100ML IV SOLN
10.0000 meq | INTRAVENOUS | Status: AC
  Administered 2022-09-01: 10 meq via INTRAVENOUS
  Filled 2022-09-01: qty 100

## 2022-09-01 MED ORDER — QUETIAPINE FUMARATE ER 200 MG PO TB24
400.0000 mg | ORAL_TABLET | Freq: Every day | ORAL | Status: DC
  Administered 2022-09-01 – 2022-09-06 (×6): 400 mg via ORAL
  Filled 2022-09-01: qty 1
  Filled 2022-09-01 (×6): qty 2

## 2022-09-01 MED ORDER — PANTOPRAZOLE SODIUM 40 MG PO TBEC
40.0000 mg | DELAYED_RELEASE_TABLET | Freq: Every day | ORAL | Status: DC
Start: 2022-09-03 — End: 2022-09-04
  Administered 2022-09-03: 40 mg via ORAL
  Filled 2022-09-01: qty 1

## 2022-09-01 MED ORDER — LACTATED RINGERS IV SOLN: INTRAVENOUS | Status: DC

## 2022-09-01 MED ORDER — GADOBUTROL 1 MMOL/ML IV SOLN
9.0000 mL | Freq: Once | INTRAVENOUS | Status: AC | PRN
  Administered 2022-09-01: 9 mL via INTRAVENOUS

## 2022-09-01 MED ORDER — POTASSIUM CHLORIDE IN NACL 20-0.9 MEQ/L-% IV SOLN
INTRAVENOUS | Status: DC
  Filled 2022-09-01 (×4): qty 1000

## 2022-09-01 MED ORDER — INSULIN ASPART 100 UNIT/ML IJ SOLN
0.0000 [IU] | INTRAMUSCULAR | Status: DC
  Administered 2022-09-01: 2 [IU] via SUBCUTANEOUS
  Administered 2022-09-02: 3 [IU] via SUBCUTANEOUS
  Administered 2022-09-02: 1 [IU] via SUBCUTANEOUS
  Administered 2022-09-02: 3 [IU] via SUBCUTANEOUS
  Administered 2022-09-02: 2 [IU] via SUBCUTANEOUS

## 2022-09-01 MED ORDER — MAGNESIUM SULFATE 4 GM/100ML IV SOLN
4.0000 g | Freq: Once | INTRAVENOUS | Status: AC
  Administered 2022-09-01: 4 g via INTRAVENOUS
  Filled 2022-09-01: qty 100

## 2022-09-01 MED ORDER — CYANOCOBALAMIN 1000 MCG/ML IJ SOLN
1000.0000 ug | Freq: Once | INTRAMUSCULAR | Status: AC
  Administered 2022-09-01: 1000 ug via SUBCUTANEOUS
  Filled 2022-09-01 (×2): qty 1

## 2022-09-01 MED ORDER — PANTOPRAZOLE SODIUM 40 MG IV SOLR
40.0000 mg | INTRAVENOUS | Status: AC
  Administered 2022-09-01 – 2022-09-02 (×2): 40 mg via INTRAVENOUS
  Filled 2022-09-01 (×2): qty 10

## 2022-09-01 MED ORDER — SODIUM CHLORIDE 0.9 % IV BOLUS
1000.0000 mL | Freq: Once | INTRAVENOUS | Status: AC
  Administered 2022-09-01: 1000 mL via INTRAVENOUS

## 2022-09-01 NOTE — Consult Note (Signed)
Reason for Consult:Left lower leg mass Referring Physician: Charlesetta Shanks Time called: [id number] Time at bedside: [place] is an 56 y.o. female.  HPI: [name] has been dealing with a lower leg mass for about a month. She was seen for it about 2w ago where aspiration was attempted without success. She was placed on Augmentin but was unable to take it 2/2 nausea. She presented again to the ED and underwent I&D by EDP with only a bloody fluid obtained. She is somnolent bordering on obtunded this morning and would not answer questions.  Past Medical History:  Diagnosis Date   Anxiety    Arthritis    Bipolar disorder ([place])    Broken jaw ([place])    Broken wrist    Chronic back pain    Claudication ([place])    a. 12/2013 ABI's: R 0.97, L 0.94.   Colon cancer ([place])    COPD (chronic obstructive pulmonary disease) (HCC)    Depression    Diabetic Charcot's foot (HCC)    Diabetic foot ulcer ([place])    chronic left foot ulcer , great toe/  hx recurrent foot ulcer bilaterally   DJD (degenerative joint disease)    Edema of both lower extremities    Episode of memory loss    Family history of bladder cancer    Family history of colon cancer    Family history of kidney cancer    GERD (gastroesophageal reflux disease)    Headache(784.0)    Hiatal hernia    History of colon cancer, stage I dx 02-26-2015--- oncologist-- dr [name]--- per last note no recurrence   05-16-2015 s/p  Laparoscopic sigmoid colectomy w/ node bx's (negative nodes per path)  Stage I (pT1,N0,M0) Grade 2   History of CVA with residual deficit 02-12-2014  post op cardiac cath.   per MRI multiple small strokes post cardiac cath. --  residual mild memory loss   History of methicillin resistant staphylococcus aureus (MRSA)    Hypercholesterolemia    Hypertension    Insomnia    Insulin dependent type 2 diabetes mellitus, uncontrolled dx 2004   endocrinologist-  dr Dwyane Dee-- last A1c 8.8 in Aug2018:  pt is noncompliant w/ diet,  states does note eat breakfast , her first main meal in afternoon   Neurogenic bladder    Neuropathy, diabetic (HCC)    hands and feet   OSA (obstructive sleep apnea)    cpap intolerant   Personality disorder (HCC)    Restless leg syndrome    SOB (shortness of breath) on exertion    Stroke [facility])    SUI (stress urinary incontinence, female) S/P SLING 12-29-2011    Past Surgical History:  Procedure Laterality Date   AMPUTATION Right 04/13/2018   Procedure: RIGHT BELOW KNEE AMPUTATION;  Surgeon: Newt Minion, MD;  Location: [place];  Service: Orthopedics;  Laterality: Right;   AMPUTATION Left 10/26/2018   Procedure: LEFT FOOT FIRST RAY AMPUTATION;  Surgeon: Newt Minion, MD;  Location: [place];  Service: Orthopedics;  Laterality: Left;   AMPUTATION TOE Left 05/10/2019   Procedure: AMPUTATION 2nd left TOE WITH METATARSAL;  Surgeon: Landis Martins, DPM;  Location: [place];  Service: Podiatry;  Laterality: Left;   ANTERIOR CERVICAL DECOMP/DISCECTOMY FUSION  0175   C5 - 7   APPLICATION OF WOUND VAC Left 10/26/2018   Procedure: APPLICATION OF WOUND VAC;  Surgeon: Newt Minion, MD;  Location: [place];  Service: Orthopedics;  Laterality: Left;   BACK  SURGERY     CARDIAC CATHETERIZATION  05-22-2008   DR SKAINS   NO SIGNIFECANT CAD/ NORMAL LV/  EF 65%/  NO WALL MOTION ABNORMALITIES   CARPAL TUNNEL RELEASE Right 04-25-2013   CATARACT EXTRACTION Right 10/24/2018   CESAREAN SECTION  1989; 1992   COLON SURGERY     COLONOSCOPY     CYSTO N/A 04/30/2013   Procedure: CYSTOSCOPY;  Surgeon: Reece Packer, MD;  [facility];  Service: Urology;  Laterality: N/A;   CYSTOSCOPY MACROPLASTIQUE IMPLANT N/A 02/06/2018   Procedure: CYSTOSCOPY MACROPLASTIQUE IMPLANT;  Surgeon: Bjorn Loser, MD;  [facility];  Service: Urology;  Laterality: N/A;   CYSTOSCOPY WITH INJECTION  05/04/2012   Procedure: CYSTOSCOPY WITH INJECTION;  Surgeon: Reece Packer, MD;  Location: [place];  Service: Urology;  Laterality: N/A;  MACROPLASTIQUE INJECTION   CYSTOSCOPY WITH INJECTION  08/28/2012   Procedure: CYSTOSCOPY WITH INJECTION;  Surgeon: Reece Packer, MD;  Location: [place];  Service: Urology;  Laterality: N/A;  cysto and macroplastique    FOOT SURGERY Bilateral    "related to [place]"   [place]   "stomach"   I & D EXTREMITY Left 10/25/2018   Procedure: IRRIGATION AND DEBRIDEMENT GREAT TOE;  Surgeon: Meredith Pel, MD;  Location: [place];  Service: Orthopedics;  Laterality: Left;   INCISION AND DRAINAGE Left 10/25/2018   GREAT TOE   INCISION AND DRAINAGE OF WOUND Right 04/11/2018   Procedure: IRRIGATION AND DEBRIDEMENT WOUND right foot and right ankle;  Surgeon: Evelina Bucy, DPM;  [facility];  Service: Podiatry;  Laterality: Right;   KNEE ARTHROSCOPY Left    KNEE ARTHROSCOPY W/ ALLOGRAFT IMPANT Left    graft x 2   KNEE SURGERY     TOTAL 8 SURG'S   LAPAROSCOPIC SIGMOID COLECTOMY N/A 04/22/2015   Procedure: LAPAROSCOPIC HAND ASSISTED SIGMOID COLECTOMY;  Surgeon: Erroll Luna, MD;  Location: [place];  Service: General;  Laterality: N/A;   LEFT HEART CATHETERIZATION WITH CORONARY ANGIOGRAM N/A 02/12/2014   Procedure: LEFT HEART CATHETERIZATION WITH CORONARY ANGIOGRAM;  Surgeon: Candee Furbish, MD;  Location: [facility] C[facility] LAB;  Service: Cardiovascular;  Laterality: N/A;   No angiographically significant CAD; normal LVSF, LVEDP 66mHg,  EF 55% (new finding ef 30% myoview 01-08-2014)   LUMBAR FUSION     MANDIBLE FRACTURE SURGERY     MULTIPLE LAPAROSCOPIES FOR ENDOMETRIOSIS     PUBOVAGINAL SLING  12/29/2011   Procedure: PGaynelle Arabian  Surgeon: SReece Packer MD;  [facility]  Service: Urology;  Laterality: N/A;  cysto and sparc sling    PUBOVAGINAL SLING N/A 04/30/2013   Procedure: REMOVAL OF VAGINAL MESH;  Surgeon: SReece Packer MD;  [facility];  Service: Urology;  Laterality:  N/A;   RECONSTURCTION OF CONGENITAL UTERUS ANOMALY  1983   REPEAT RECONSTRUCTION ACL LEFT KNEE/ SCREWS REMOVED  03-28-2000   CADAVER GRAFT   TOTAL ABDOMINAL HYSTERECTOMY  1997   w/BSO   TRANSTHORACIC ECHOCARDIOGRAM  02/13/2014   ef 45%, hypokinesis base inferior and base inferolateral walls   UPPER GI ENDOSCOPY     WOUND DEBRIDEMENT Right 09/05/2016   Procedure: DEBRIDEMENT WOUND WITH GRAFT RIGHT FOOT;  Surgeon: TLandis Martins DPM;  [place]  Service: Podiatry;  Laterality: Right;   WRIST FRACTURE SURGERY      Family History  Problem Relation Age of Onset   Hypertension Mother    Diabetes  Mother    Cancer - Other Mother        lymphoma    Cancer - Other Father        lung, bladder cancer    Heart attack Father    Other Father        Lynch syndrome   Cancer - Other Brother        bladder cancer    Bladder Cancer Paternal Aunt        Lynch syndrome   Other Paternal Uncle        Lynch syndrome   Other Cousin        Lynch syndrome    Social History:  reports that she has been smoking e-cigarettes. She has never used smokeless tobacco. She reports that she does not currently use alcohol. She reports that she does not currently use drugs.  Allergies:  Allergies  Allergen Reactions   Latex Itching, Rash and Other (See Comments)    Pt states she cannot use condoms - cause an infection.  Use of latex on skin is okay for short period of time.  Tape causes rash   Sweetening Enhancer [Flavoring Agent] Nausea And Vomiting and Other (See Comments)    HEADACHES   Amoxicillin Nausea And Vomiting   Ropinirole     Pt stated, "I am getting cramps in my legs - especially at night"   Aspartame And Phenylalanine Nausea And Vomiting    HEADACHES   Ceftriaxone Rash    -rash between thighs after running for 10-15 minutes.   Ibuprofen Other (See Comments)    HEADACHES   Trazodone And Nefazodone Other (See Comments)    Hallucinations     Medications: I have reviewed the  patient's current medications.  Results for orders placed or performed during the hospital encounter of 08/31/22 (from the past 48 hour(s))  Lactic acid, plasma     Status: Abnormal   Collection Time: 08/31/22  1:00 PM  Result Value Ref Range   Lactic Acid, Venous 4.8 (HH) 0.5 - 1.9 mmol/L    Comment: CRITICAL RESULT CALLED TO, READ BACK BY AND VERIFIED WITH Ivin Poot, [place] 08/31/22 L. KLAR Performed at [facility], [place] [address] [place], [place] [address]   CBC     Status: Abnormal   Collection Time: 08/31/22  1:00 PM  Result Value Ref Range   WBC 8.5 4.0 - 10.5 K/uL   RBC 3.04 (L) 3.87 - 5.11 MIL/uL   Hemoglobin 9.2 (L) 12.0 - 15.0 g/dL   HCT 25.4 (L) 36.0 - 46.0 %   MCV 83.6 80.0 - 100.0 fL   MCH 30.3 26.0 - 34.0 pg   MCHC 36.2 (H) 30.0 - 36.0 g/dL   RDW 12.1 11.5 - 15.5 %   Platelets 291 150 - 400 K/uL   nRBC 0.0 0.0 - 0.2 %    Comment: Performed at [facility], [place] [address], [place], [place] [address]  Comprehensive metabolic panel     Status: Abnormal   Collection Time: 08/31/22  1:00 PM  Result Value Ref Range   Sodium 121 (L) 135 - 145 mmol/L   Potassium 2.7 (LL) 3.5 - 5.1 mmol/L    Comment: CRITICAL RESULT CALLED TO, READ BACK BY AND VERIFIED WITH  Ivin Poot, RN [phone] 08/31/22 L. KLAR   Chloride 79 (L) 98 - 111 mmol/L   CO2 25 22 - 32 mmol/L   Glucose, Bld 225 (H) 70 - 99 mg/dL  Comment: Glucose reference range applies only to samples taken after fasting for at least 8 hours.   BUN 8 6 - 20 mg/dL   Creatinine, Ser 1.20 (H) 0.44 - 1.00 mg/dL   Calcium 8.0 (L) 8.9 - 10.3 mg/dL   Total Protein 5.9 (L) 6.5 - 8.1 g/dL   Albumin 3.4 (L) 3.5 - 5.0 g/dL   AST 24 15 - 41 U/L   ALT 16 0 - 44 U/L   Alkaline Phosphatase 62 38 - 126 U/L   Total Bilirubin 0.6 0.3 - 1.2 mg/dL   GFR, Estimated 53 (L) >60 mL/min    Comment: (NOTE) Calculated using the CKD-EPI Creatinine Equation (2021)    Anion gap 17 (H) 5 - 15    Comment: Performed at [facility], [place] [address] [place], [place] [address]  Culture, blood (routine x 2)     Status: None (Preliminary result)   Collection Time: 08/31/22  3:24 PM   Specimen: BLOOD  Result Value Ref Range   Specimen Description BLOOD LEFT ANTECUBITAL    Special Requests      BOTTLES DRAWN AEROBIC AND ANAEROBIC Blood Culture results may not be optimal due to an inadequate volume of blood received in culture bottles   Culture      NO GROWTH < 24 HOURS Performed at [facility] [address] [place], [place] [address]    Report Status PENDING   Culture, blood (routine x 2)     Status: None (Preliminary result)   Collection Time: 08/31/22  3:28 PM   Specimen: BLOOD  Result Value Ref Range   Specimen Description BLOOD RIGHT ANTECUBITAL    Special Requests      BOTTLES DRAWN AEROBIC AND ANAEROBIC Blood Culture results may not be optimal due to an inadequate volume of blood received in culture bottles   Culture      NO GROWTH < 24 HOURS Performed at [facility] [address], [place], [place] [address]    Report Status PENDING   Lactic acid, plasma     Status: Abnormal   Collection Time: 08/31/22  4:24 PM  Result Value Ref Range   Lactic Acid, Venous 3.4 (HH) 0.5 - 1.9 mmol/L    Comment: CRITICAL VALUE NOTED.  VALUE IS CONSISTENT WITH PREVIOUSLY REPORTED AND CALLED VALUE. Performed at [facility], [place] [address], [place], [place] [address]   SARS Coronavirus 2 by RT PCR (hospital order, performed in [facility] hospital lab) *cepheid single result test*     Status: None   Collection Time: 08/31/22  4:26 PM   Specimen: Nasal Swab  Result Value Ref Range   SARS Coronavirus 2 by RT PCR NEGATIVE NEGATIVE    Comment: (NOTE) SARS-CoV-2 target nucleic acids are NOT DETECTED.  The SARS-CoV-2 RNA is generally detectable in upper and lower respiratory specimens during the acute phase of infection. The lowest concentration of SARS-CoV-2 viral copies this assay can detect is  250 copies / mL. A negative result does not preclude SARS-CoV-2 infection and should not be used as the sole basis for treatment or other patient management decisions.  A negative result may occur with improper specimen collection / handling, submission of specimen other than nasopharyngeal swab, presence of viral mutation(s) within the areas targeted by this assay, and inadequate number of viral copies (<250 copies / mL). A negative result must be combined with clinical observations, patient history, and epidemiological information.  Fact Sheet for  Patients:   https://www.patel.info/  Fact Sheet for Healthcare Providers: https://hall.com/  This test is not yet approved or  cleared by the [place] FDA and has been authorized for detection and/or diagnosis of SARS-CoV-2 by FDA under an Emergency Use Authorization (EUA).  This EUA will remain in effect (meaning this test can be used) for the duration of the COVID-19 declaration under Section 564(b)(1) of the Act, 21 U.S.C. section 360bbb-3(b)(1), unless the authorization is terminated or revoked sooner.  Performed at [facility], [place] [address], [place], [place] [address]   I-Stat venous blood gas, ED     Status: Abnormal   Collection Time: 08/31/22  4:49 PM  Result Value Ref Range   pH, Ven 7.592 (H) 7.25 - 7.43   pCO2, Ven 32.1 (L) 44 - 60 mmHg   pO2, Ven 58 (H) 32 - 45 mmHg   Bicarbonate 31.0 (H) 20.0 - 28.0 mmol/L   TCO2 32 22 - 32 mmol/L   O2 Saturation 94 %   Acid-Base Excess 9.0 (H) 0.0 - 2.0 mmol/L   Sodium 120 (L) 135 - 145 mmol/L   Potassium 2.8 (L) 3.5 - 5.1 mmol/L   Calcium, Ion 0.90 (L) 1.15 - 1.40 mmol/L   HCT 29.0 (L) 36.0 - 46.0 %   Hemoglobin 9.9 (L) 12.0 - 15.0 g/dL   Sample type VENOUS   Aerobic Culture w Gram Stain (superficial specimen)     Status: None (Preliminary result)   Collection Time: 08/31/22  5:04 PM   Specimen: Wound  Result Value Ref  Range   Specimen Description WOUND    Special Requests NONE    Gram Stain NO WBC SEEN NO ORGANISMS SEEN     Culture      TOO YOUNG TO READ Performed at [facility], [address] [place], [place] [address]    Report Status PENDING   Sedimentation rate     Status: None   Collection Time: 08/31/22  7:50 PM  Result Value Ref Range   Sed Rate 8 0 - 22 mm/hr    Comment: Performed at [facility], [place] [address] [place], [place] [address]  C-reactive protein     Status: Abnormal   Collection Time: 08/31/22  7:50 PM  Result Value Ref Range   CRP 1.2 (H) <1.0 mg/dL    Comment: Performed at [facility], [place] [address], [place], [place] [address]  HIV Antibody (routine testing w rflx)     Status: None   Collection Time: 08/31/22  7:50 PM  Result Value Ref Range   HIV Screen 4th Generation wRfx Non Reactive Non Reactive    Comment: Performed at [facility], [place] [address] [place], [place] [address]  Type and screen [place]     Status: None   Collection Time: 08/31/22  7:50 PM  Result Value Ref Range   ABO/RH(D) O NEG    Antibody Screen NEG    Sample Expiration      09/03/2022,2359 Performed at [facility], [place] [address] [place],  [address]   Magnesium     Status: Abnormal   Collection Time: 08/31/22  7:50 PM  Result Value Ref Range   Magnesium 0.8 (LL) 1.7 - 2.4 mg/dL    Comment: CRITICAL RESULT CALLED TO, READ BACK BY AND VERIFIED WITH Brantley Persons, RN, 2134 08/31/22, Loni Muse RAMSEY Performed at [facility], [address] [place], [place]  [address]   Basic metabolic panel     Status: Abnormal   Collection Time: 08/31/22  7:50 PM  Result Value Ref Range   Sodium 124 (L) 135 - 145 mmol/L   Potassium 2.8 (L) 3.5 - 5.1 mmol/L   Chloride 79 (L) 98 - 111 mmol/L   CO2 27 22 - 32 mmol/L   Glucose, Bld 182 (H) 70 - 99 mg/dL    Comment: Glucose reference range applies only to samples taken after fasting for at least 8  hours.   BUN 8 6 - 20 mg/dL   Creatinine, Ser 1.15 (H) 0.44 - 1.00 mg/dL   Calcium 7.9 (L) 8.9 - 10.3 mg/dL   GFR, Estimated 56 (L) >60 mL/min    Comment: (NOTE) Calculated using the CKD-EPI Creatinine Equation (2021)    Anion gap 18 (H) 5 - 15    Comment: Performed at [facility] [address] [place], [place] [address]  Hemoglobin and hematocrit, blood     Status: Abnormal   Collection Time: 08/31/22  7:50 PM  Result Value Ref Range   Hemoglobin 9.0 (L) 12.0 - 15.0 g/dL   HCT 24.0 (L) 36.0 - 46.0 %    Comment: Performed at [facility], [place] [address], [place], [place] [address]  Hemoglobin A1c     Status: Abnormal   Collection Time: 08/31/22  7:50 PM  Result Value Ref Range   Hgb A1c MFr Bld 9.7 (H) 4.8 - 5.6 %    Comment: (NOTE) Pre diabetes:          5.7%-6.4%  Diabetes:              >6.4%  Glycemic control for   <7.0% adults with diabetes    Mean Plasma Glucose 231.69 mg/dL    Comment: Performed at [facility] [address], [place], [place] [address]  Lactic acid, plasma     Status: Abnormal   Collection Time: 08/31/22  7:50 PM  Result Value Ref Range   Lactic Acid, Venous 2.8 (HH) 0.5 - 1.9 mmol/L    Comment: CRITICAL VALUE NOTED.  VALUE IS CONSISTENT WITH PREVIOUSLY REPORTED AND CALLED VALUE. Performed at [facility], [place] [address] [place], [place] [address]   TSH     Status: None   Collection Time: 08/31/22  7:50 PM  Result Value Ref Range   TSH 3.920 0.350 - 4.500 uIU/mL    Comment: Performed by a 3rd Generation assay with a functional sensitivity of <=0.01 uIU/mL. Performed at [facility], [place] [address], [place], [place] [address]   Urinalysis, Routine w reflex microscopic Urine, Clean Catch     Status: Abnormal   Collection Time: 08/31/22  9:36 PM  Result Value Ref Range   Color, Urine STRAW (A) YELLOW   APPearance CLEAR CLEAR   Specific Gravity, Urine 1.008 1.005 - 1.030   pH 5.0 5.0 - 8.0   Glucose, UA  NEGATIVE NEGATIVE mg/dL   Hgb urine dipstick SMALL (A) NEGATIVE   Bilirubin Urine NEGATIVE NEGATIVE   Ketones, ur NEGATIVE NEGATIVE mg/dL   Protein, ur NEGATIVE NEGATIVE mg/dL   Nitrite NEGATIVE NEGATIVE   Leukocytes,Ua NEGATIVE NEGATIVE   RBC / HPF 0-5 0 - 5 RBC/hpf   WBC, UA 6-10 0 - 5 WBC/hpf   Bacteria, UA RARE (A) NONE SEEN   Squamous Epithelial / LPF 0-5 0 - 5    Comment: Performed at [facility], [address] [place], [place] [address]  Urine rapid drug screen (hosp performed)     Status: Abnormal   Collection Time: 08/31/22  9:36 PM  Result Value Ref Range   Opiates NONE DETECTED NONE DETECTED   Cocaine NONE DETECTED NONE DETECTED   Benzodiazepines POSITIVE (A) NONE DETECTED   Amphetamines NONE DETECTED NONE DETECTED   Tetrahydrocannabinol NONE DETECTED NONE DETECTED   Barbiturates NONE DETECTED NONE DETECTED    Comment: (NOTE) DRUG SCREEN FOR MEDICAL PURPOSES ONLY.  IF CONFIRMATION IS NEEDED FOR ANY PURPOSE, NOTIFY LAB WITHIN 5 DAYS.  LOWEST DETECTABLE LIMITS FOR URINE DRUG SCREEN Drug Class                     Cutoff (ng/mL) Amphetamine and metabolites    1000 Barbiturate and metabolites    200 Benzodiazepine                 174 Tricyclics and metabolites     300 Opiates and metabolites        300 Cocaine and metabolites        300 THC                            50 Performed at [facility], [place] [address], [place], [place] [address]   Sodium, urine, random     Status: None   Collection Time: 08/31/22  9:36 PM  Result Value Ref Range   Sodium, Ur 27 mmol/L    Comment: Performed at [facility] [address] [place], [place] [address]  Osmolality, urine     Status: Abnormal   Collection Time: 08/31/22  9:36 PM  Result Value Ref Range   Osmolality, Ur 154 (L) 300 - 900 mOsm/kg    Comment: Performed at [facility] [address] [place], [place] [address]  CBG monitoring, ED     Status: Abnormal   Collection Time: 08/31/22   9:43 PM  Result Value Ref Range   Glucose-Capillary 178 (H) 70 - 99 mg/dL    Comment: Glucose reference range applies only to samples taken after fasting for at least 8 hours.  Lactic acid, plasma     Status: Abnormal   Collection Time: 08/31/22 11:30 PM  Result Value Ref Range   Lactic Acid, Venous 2.2 (HH) 0.5 - 1.9 mmol/L    Comment: CRITICAL VALUE NOTED. VALUE IS CONSISTENT WITH PREVIOUSLY REPORTED/CALLED VALUE Performed at [facility], [place] [address] [place], [place] [address]   CBC     Status: Abnormal   Collection Time: 09/01/22  2:54 AM  Result Value Ref Range   WBC 14.7 (H) 4.0 - 10.5 K/uL   RBC 2.82 (L) 3.87 - 5.11 MIL/uL   Hemoglobin 8.6 (L) 12.0 - 15.0 g/dL   HCT 24.2 (L) 36.0 - 46.0 %   MCV 85.8 80.0 - 100.0 fL   MCH 30.5 26.0 - 34.0 pg   MCHC 35.5 30.0 - 36.0 g/dL   RDW 12.3 11.5 - 15.5 %   Platelets 307 150 - 400 K/uL   nRBC 0.0 0.0 - 0.2 %    Comment: Performed at [facility], [place] [address] [place], [place] [address]  CBG monitoring, ED     Status: Abnormal   Collection Time: 09/01/22  7:56 AM  Result Value Ref Range   Glucose-Capillary 138 (H) 70 - 99 mg/dL  Comment: Glucose reference range applies only to samples taken after fasting for at least 8 hours.   *Note: Due to a large number of results and/or encounters for the requested time period, some results have not been displayed. A complete set of results can be found in Results Review.    DG Knee 2 Views Left  Result Date: 08/31/2022 CLINICAL DATA:  Large fluctuant mass involving the proximal aspect of the left tibia. EXAM: LEFT KNEE - 1-2 VIEW COMPARISON:  01/20/2020 FINDINGS: Focal soft tissue swelling about the tibial tuberosity without associated fracture or radiopaque foreign body. No discrete areas of osteolysis to suggest osteomyelitis. No subcutaneous emphysema. No patella baja or Alta deformity. Stable sequela of previous ACL repair without evidence of loosening of the radiopaque  components. 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 Several loose bodies are noted about the posterior aspect of the knee joint space though discrete donor sites are not identified. Enthesopathic change involving the superior and inferior poles of the patella. IMPRESSION: 1. Nonspecific focal soft tissue swelling about the tibial tuberosity without associated fracture, radiopaque foreign body radiographic evidence of osteomyelitis. 2. Stable sequela of previous ACL repair without evidence of loosening of the radiopaque components. 3. 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 Electronically Signed   By: Sandi Mariscal M.D.   On: 08/31/2022 13:43    Review of Systems  Unable to perform ROS: Mental status change   Blood pressure 99/68, pulse 60, temperature (!) 97.5 F (36.4 C), resp. rate 18, height '5\' 5"'$  (1.651 m), weight 95.3 kg, SpO2 100 %. Physical Exam Constitutional:      General: She is not in acute distress.    Appearance: She is well-developed. She is not diaphoretic.  HENT:     Head: Normocephalic and atraumatic.  Eyes:     General: No scleral icterus.       Right eye: No discharge.        Left eye: No discharge.     Conjunctiva/sclera: Conjunctivae normal.  Cardiovascular:     Rate and Rhythm: Normal rate and regular rhythm.  Pulmonary:     Effort: Pulmonary effort is normal. No respiratory distress.  Musculoskeletal:     Cervical back: Normal range of motion.     Comments: LLE No traumatic wounds, ecchymosis, or rash  Ant tibial mass incised and packed. No purulence, no apparent TTP. Difficult to tell consistency with packing in place.  No knee or ankle effusion  Knee stable to varus/ valgus and anterior/posterior stress  Sens DPN, SPN, TN could not assess  Motor EHL, ext, flex, evers could not assess  DP 2+,  PT 1+, No significant edema  Skin:    General: Skin is warm and dry.  Neurological:     Mental Status: She is alert.  Psychiatric:        Mood and Affect: Mood normal.        Behavior: Behavior normal.     Assessment/Plan: Left lower leg mass -- Will get MRI to better characterize. This seems separate from a prepatellar bursitis. Plan to remove packing tomorrow.    Lisette Abu, PA-C Orthopedic Surgery [phone] 09/01/2022, 9:14 AM

## 2022-09-01 NOTE — Progress Notes (Signed)
Progress Note Patient: [name]:[id number] DOB: 10-Feb-1966 DOA: 08/31/2022  DOS: the patient was seen and examined on 09/01/2022  Brief hospital course: PMH of COPD, type II DM, hypothyroidism, schizophrenia, OSA not on CPAP, colon cancer, presented to hospital with complaints of left knee swelling and pain.  Concern about septic arthritis and bursitis.  Orthopedic was consulted. On 9/7 patient is significantly drowsy and lethargic.  Holding multiple psychotropic medications.  Mentation improved as the day progressed therefore initiating dysphagia 1 diet. Assessment and Plan: Cellulitis with concern for possible septic infrapatellar bursitis Patient presents with complaints of redness and swelling right knee over the last 2 months. X-rays revealed soft tissue swelling without signs of osteomyelitis.  Patient has been ordered Rocephin and vancomycin, but developed rash on Rocephin for which it was stopped.   ED provider attempted aspiration of the knee joint without success. Orthopedic consulted. Continue pain control. MRI negative for osteomyelitis.  Negative for septic arthritis. Continue with IV antibiotics and follow-up on cultures.  Currently on Unasyn.   Lactic acidosis  Acute.  Lactic acid elevated at 4.8-> 3.4.  Treated with IV fluids.  Continue.   Normocytic anemia Hemoglobin stable.  Previously was 13 currently 9. No active bleeding seen. Monitor.   Hyponatremia Severely low. Prior sodium level normal. Patient was treated with half-normal saline. Sodium level improving.  We will continue with normal saline. Monitor.   Hypokalemia Replacing potassium with IV. Monitor. Recheck.  Hypomagnesemia corrected as well.   Hypocalcemia Treated with calcium gluconate. Monitor.   Nausea and vomiting Resolved for now. Monitor.   Essential hypertension Currently hypotensive. Holding all blood pressure medication. Holding diuretics. Receiving IV fluids.    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 Continue sliding scale insulin every 4 hours as the patient will remain on dysphagia 1 diet or n.p.o. Discontinue gabapentin due to somnolence.   Renal insufficiency/possible acute kidney injury Patient presents with creatinine of 1.2.  Baseline creatinine previously noted to be 0.92 on 02/10/2022.  This is not greater than 0.3 increased from previous baseline to suggest acute kidney injury at this time. Monitor with IV fluids.   Depression and anxiety Schizophrenia. Tardive dyskinesia. Acute metabolic/toxic encephalopathy Patient is significantly drowsy and lethargic on 9/7. Suspect this is multi factorial although most likely polypharmacy related. Currently I will be holding all psychotropic medications for the patient. No focal deficit.  Patient is easily arousable and able to follow commands later in the day. Reducing Seroquel dose from 80 mg to 400 mg.   Hypothyroidism Normal TSH normal free.  Monitor. Continue Synthroid starting tomorrow.   Chronic pain -Continue oxycodone as needed for pain minimize narcotics.   PAD -Continue statin   History of colon cancer Status post resection in 2016.  Patient denies any reports of bleeding.   Nicotine use Patient currently uses electronic cigarettes   GERD -Continue Protonix  Subjective: Drowsy and lethargic.  Unable to follow commands earlier in the day.  Later as the day progressed patient is able to follow commands although still significantly drowsy.  Physical Exam: Vitals:   09/01/22 1245 09/01/22 1300 09/01/22 1315 09/01/22 1512  BP: 96/67 (!) 85/53 101/67 101/60  Pulse: 62 62 62 65  Resp: 19     Temp:   98.6 F (37 C) 97.8 F (36.6 C)  TempSrc:   Oral Oral  SpO2: 97% 97% 99% 98%  Weight:      Height:       General: Appear  in mild distress; no visible Abnormal Neck Mass Or lumps, Conjunctiva normal Cardiovascular: S1 and S2 Present,  no Murmur, Respiratory: good respiratory effort, Bilateral Air entry present and CTA, no Crackles, no wheezes Abdomen: Bowel Sound present, Non tender  Extremities: trace Pedal edema Neurology: lethargic and oriented to self no focal deficit on examination. Gait not checked due to patient safety concerns   Data Reviewed: I have Reviewed nursing notes, Vitals, and Lab results since pt's last encounter. Pertinent lab results CBC and BMP I have ordered test including CBC, BMP,    Family Communication: No one at bedside  Disposition: Status is: Inpatient Remains inpatient appropriate because: Needing IV antibiotics as well as further work-up and stabilization of mentation.  Author: Berle Mull, MD 09/01/2022 6:55 PM  Please look on [email or web address] to find out who is on call.

## 2022-09-01 NOTE — Inpatient Diabetes Management (Addendum)
Inpatient Diabetes Program Recommendations  AACE/ADA: New Consensus Statement on Inpatient Glycemic Control (2015)  Target Ranges:  Prepandial:   less than 140 mg/dL      Peak postprandial:   less than 180 mg/dL (1-2 hours)      Critically ill patients:  140 - 180 mg/dL   Lab Results  Component Value Date   GLUCAP 128 (H) 09/01/2022   HGBA1C 9.7 (H) 08/31/2022    Review of Glycemic Control  Latest Reference Range & Units 08/31/22 21:43 09/01/22 07:56 09/01/22 11:40  Glucose-Capillary 70 - 99 mg/dL 178 (H) 138 (H) 128 (H)  (H): Data is abnormally high Diabetes history:  Type 2 DM Outpatient Diabetes medications: Humalog 5 units QPM, Metformin 750 mg BID, Jardiance 25 mg QD, Victoza 1.8 mg QD, Lantus 10 units QA Current orders for Inpatient glycemic control: Levemir 6 units x 1, Novolog 0-15 units & HS  Inpatient Diabetes Program Recommendations:    Spoke with patient regarding outpatient diabetes management. Patient is followed by Dr Dwyane Dee, outpatient endocrinology with last appointment 01/2022. At this visit, patient's A1C was 7.4%. However, patient states, "Two months ago I saw my new primary PA and he told me to stop my Lantus, Humalog and Victoza since it wasn't working anyway." Reviewed patient's current A1c of 9.7%. Explained what a A1c is and what it measures. Also reviewed goal A1c with patient, importance of good glucose control @ home, and blood sugar goals. Reviewed patho of DM, need for insulin, role of pancreas, Metformin, survival skills, vascular changes, impact of infection risk with poor glycemic control and other commorbidities.  Patient has a meter and supplies to check blood sugars.  Denies hypoglycemia. Reports that blood sugars usually in the 300's mg/dL.  Denies drinking sugary beverages, however at the bedside sipping on regular soda. Reviewed alternatives. Encouraged mindfulness.  Patient states she has scheduled visit with Dwyane Dee on 12th. Encouraged to  communication all medication changes regarding diabetes with Dr Dwyane Dee and to reach out if CBGs exceed goals consistently.  No further questions at this time.   Thanks, Bronson Curb, MSN, RNC-OB Diabetes Coordinator [phone] (8a-5p)

## 2022-09-01 NOTE — ED Notes (Signed)
Patient transported to MRI 

## 2022-09-01 NOTE — Hospital Course (Addendum)
PMH of COPD, type II DM, hypothyroidism, schizophrenia, OSA not on CPAP, colon cancer, presented to hospital with complaints of left knee swelling and pain.  Concern about septic arthritis and bursitis.  Orthopedic was consulted. On 9/7 patient is significantly drowsy and lethargic.  Holding multiple psychotropic medications.  Mentation improved with holding medications.  Currently psychiatry consult for further assistance with medications.

## 2022-09-01 NOTE — Evaluation (Signed)
Clinical/Bedside Swallow Evaluation Patient Details  Name: [name] MRN: [id number] Date of Birth: 1965/12/29  Today's Date: 09/01/2022 Time: SLP Start Time (ACUTE ONLY): 1045 SLP Stop Time (ACUTE ONLY): 1055 SLP Time Calculation (min) (ACUTE ONLY): 10 min  Past Medical History:  Past Medical History:  Diagnosis Date   Anxiety    Arthritis    Bipolar disorder ([place])    Broken jaw ([place])    Broken wrist    Chronic back pain    Claudication ([place])    a. 12/2013 ABI's: R 0.97, L 0.94.   Colon cancer ([place])    COPD (chronic obstructive pulmonary disease) (HCC)    Depression    Diabetic Charcot's foot (HCC)    Diabetic foot ulcer ([place])    chronic left foot ulcer , great toe/  hx recurrent foot ulcer bilaterally   DJD (degenerative joint disease)    Edema of both lower extremities    Episode of memory loss    Family history of bladder cancer    Family history of colon cancer    Family history of kidney cancer    GERD (gastroesophageal reflux disease)    Headache(784.0)    Hiatal hernia    History of colon cancer, stage I dx 02-26-2015--- oncologist-- dr [name]--- per last note no recurrence   05-16-2015 s/p  Laparoscopic sigmoid colectomy w/ node bx's (negative nodes per path)  Stage I (pT1,N0,M0) Grade 2   History of CVA with residual deficit 02-12-2014  post op cardiac cath.   per MRI multiple small strokes post cardiac cath. --  residual mild memory loss   History of methicillin resistant staphylococcus aureus (MRSA)    Hypercholesterolemia    Hypertension    Insomnia    Insulin dependent type 2 diabetes mellitus, uncontrolled dx 2004   endocrinologist-  dr Dwyane Dee-- last A1c 8.8 in Aug2018:  pt is noncompliant w/ diet, states does note eat breakfast , her first main meal in afternoon   Neurogenic bladder    Neuropathy, diabetic (HCC)    hands and feet   OSA (obstructive sleep apnea)    cpap intolerant   Personality disorder (HCC)    Restless leg syndrome    SOB  (shortness of breath) on exertion    Stroke [facility])    SUI (stress urinary incontinence, female) S/P SLING 12-29-2011   Past Surgical History:  Past Surgical History:  Procedure Laterality Date   AMPUTATION Right 04/13/2018   Procedure: RIGHT BELOW KNEE AMPUTATION;  Surgeon: Newt Minion, MD;  Location: [place];  Service: Orthopedics;  Laterality: Right;   AMPUTATION Left 10/26/2018   Procedure: LEFT FOOT FIRST RAY AMPUTATION;  Surgeon: Newt Minion, MD;  Location: [place];  Service: Orthopedics;  Laterality: Left;   AMPUTATION TOE Left 05/10/2019   Procedure: AMPUTATION 2nd left TOE WITH METATARSAL;  Surgeon: Landis Martins, DPM;  Location: [place];  Service: Podiatry;  Laterality: Left;   ANTERIOR CERVICAL DECOMP/DISCECTOMY FUSION  9798   C5 - 7   APPLICATION OF WOUND VAC Left 10/26/2018   Procedure: APPLICATION OF WOUND VAC;  Surgeon: Newt Minion, MD;  Location: [place];  Service: Orthopedics;  Laterality: Left;   BACK SURGERY     CARDIAC CATHETERIZATION  05-22-2008   DR SKAINS   NO SIGNIFECANT CAD/ NORMAL LV/  EF 65%/  NO WALL MOTION ABNORMALITIES   CARPAL TUNNEL RELEASE Right 04-25-2013   CATARACT EXTRACTION Right 10/24/2018   CESAREAN SECTION  1989; 1992  COLON SURGERY     COLONOSCOPY     CYSTO N/A 04/30/2013   Procedure: CYSTOSCOPY;  Surgeon: Reece Packer, MD;  Location: [facility];  Service: Urology;  Laterality: N/A;   CYSTOSCOPY MACROPLASTIQUE IMPLANT N/A 02/06/2018   Procedure: CYSTOSCOPY MACROPLASTIQUE IMPLANT;  Surgeon: Bjorn Loser, MD;  Location: [facility];  Service: Urology;  Laterality: N/A;   CYSTOSCOPY WITH INJECTION  05/04/2012   Procedure: CYSTOSCOPY WITH INJECTION;  Surgeon: Reece Packer, MD;  Location: [place];  Service: Urology;  Laterality: N/A;  MACROPLASTIQUE INJECTION   CYSTOSCOPY WITH INJECTION  08/28/2012   Procedure: CYSTOSCOPY WITH INJECTION;  Surgeon: Reece Packer, MD;  Location: [place];   Service: Urology;  Laterality: N/A;  cysto and macroplastique    FOOT SURGERY Bilateral    "related to [place]"   [place]   "stomach"   I & D EXTREMITY Left 10/25/2018   Procedure: IRRIGATION AND DEBRIDEMENT GREAT TOE;  Surgeon: Meredith Pel, MD;  Location: [place];  Service: Orthopedics;  Laterality: Left;   INCISION AND DRAINAGE Left 10/25/2018   GREAT TOE   INCISION AND DRAINAGE OF WOUND Right 04/11/2018   Procedure: IRRIGATION AND DEBRIDEMENT WOUND right foot and right ankle;  Surgeon: Evelina Bucy, DPM;  Location[facility];  Service: Podiatry;  Laterality: Right;   KNEE ARTHROSCOPY Left    KNEE ARTHROSCOPY W/ ALLOGRAFT IMPANT Left    graft x 2   KNEE SURGERY     TOTAL 8 SURG'S   LAPAROSCOPIC SIGMOID COLECTOMY N/A 04/22/2015   Procedure: LAPAROSCOPIC HAND ASSISTED SIGMOID COLECTOMY;  Surgeon: Erroll Luna, MD;  Location: [place];  Service: General;  Laterality: N/A;   LEFT HEART CATHETERIZATION WITH CORONARY ANGIOGRAM N/A 02/12/2014   Procedure: LEFT HEART CATHETERIZATION WITH CORONARY ANGIOGRAM;  Surgeon: Candee Furbish, MD;  Location: [facility];  Service: Cardiovascular;  Laterality: N/A;   No angiographically significant CAD; normal LVSF, LVEDP 91mHg,  EF 55% (new finding ef 30% myoview 01-08-2014)   LUMBAR FUSION     MANDIBLE FRACTURE SURGERY     MULTIPLE LAPAROSCOPIES FOR ENDOMETRIOSIS     PUBOVAGINAL SLING  12/29/2011   Procedure: PGaynelle Arabian  Surgeon: SReece Packer MD;  [facility]  Service: Urology;  Laterality: N/A;  cysto and sparc sling    PUBOVAGINAL SLING N/A 04/30/2013   Procedure: REMOVAL OF VAGINAL MESH;  Surgeon: SReece Packer MD;  [facility];  Service: Urology;  Laterality: N/A;   RECONSTURCTION OF CONGENITAL UTERUS ANOMALY  1983   REPEAT RECONSTRUCTION ACL LEFT KNEE/ SCREWS REMOVED  03-28-2000   CADAVER GRAFT   TOTAL ABDOMINAL HYSTERECTOMY  1997   w/BSO   TRANSTHORACIC ECHOCARDIOGRAM  02/13/2014    ef 45%, hypokinesis base inferior and base inferolateral walls   UPPER GI ENDOSCOPY     WOUND DEBRIDEMENT Right 09/05/2016   Procedure: DEBRIDEMENT WOUND WITH GRAFT RIGHT FOOT;  Surgeon: TLandis Martins DPM;  [place]  Service: Podiatry;  Laterality: Right;   WRIST FRACTURE SURGERY     HPI:  Pt is a 56y.o. female who presented with c/o swelling and pain of her right knee.  Pt reported symptoms started approximately 2 months PTA with swelling and pain of her left knee. 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    Assessment / Plan / Recommendation  Clinical  Impression  Pt was seen in the ED for bedside swallow evaluation with her sister present. Pt's sister reported that the pt has been coughing "a lot" with p.o. intake for a few months. Pt was very lethargic during the evaluation and a complete oral mechanism exam was therefore not conducted. She exhibited symptoms of oral phase dysphagia which is likely due to pt's lethargy. Bolus awareness was reduced and she required cues for bolus manipulation and swallowing. Following prompts, pt propelled a puree bolus posteriorly, but required prompts to swallow. Is it recommended that the pt's NPO status be maintained at this time with allowance of ice chips if she is adequately alert. It is recommended that meds be changed to nonoral at this time. Pt's case was discussed with Dr. Posey Pronto who is hopeful that alertness will improve soon and that short-term non-oral alimentation can be deferred. SLP will follow to assess readiness for diet initiation.  SLP Visit Diagnosis: Dysphagia, unspecified (R13.10)    Aspiration Risk  Severe aspiration risk;Risk for inadequate nutrition/hydration    Diet Recommendation NPO;Alternative means - temporary;Ice chips PRN after oral care   Medication Administration: Via alternative means    Other  Recommendations  Oral Care Recommendations: Oral care QID    Recommendations for follow up therapy are one component of a multi-disciplinary discharge planning process, led by the attending physician.  Recommendations may be updated based on patient status, additional functional criteria and insurance authorization.  Follow up Recommendations  (TBD)      Assistance Recommended at Discharge    Functional Status Assessment Patient has had a recent decline in their functional status and demonstrates the ability to make significant improvements in function in a reasonable and predictable amount of time.  Frequency and Duration min 2x/week  2 weeks       Prognosis Prognosis for Safe Diet Advancement: Good Barriers to Reach Goals: Severity of deficits      Swallow Study   General Date of Onset: 09/01/22 HPI: Pt is a 56 y.o. female who presented with c/o swelling and pain of her right knee.  Pt reported symptoms started approximately 2 months PTA with swelling and pain of her left knee. 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 Type of Study: Bedside Swallow Evaluation Previous Swallow Assessment: none Diet Prior to this Study: NPO Temperature Spikes Noted: No Respiratory Status: Room air History of Recent Intubation: No Behavior/Cognition: Lethargic/Drowsy;Requires cueing;Doesn't follow directions Oral Cavity Assessment: Dry Oral Care Completed by SLP: No Oral Cavity - Dentition: Edentulous Self-Feeding Abilities: Total assist Patient Positioning: Upright in bed;Postural control adequate for testing Baseline Vocal Quality: Low vocal intensity Volitional Cough: Cognitively unable to elicit Volitional Swallow: Unable to elicit    Oral/Motor/Sensory Function Overall Oral Motor/Sensory Function:  (UTA)   Ice Chips Ice chips: Impaired Presentation: Spoon Oral Phase Impairments: Poor awareness of  bolus Oral Phase Functional Implications: Prolonged oral transit   Thin Liquid Thin Liquid: Not tested    Nectar Thick Nectar Thick Liquid: Not tested   Honey Thick Honey Thick Liquid: Not tested   Puree Puree: Impaired Presentation: Spoon Oral Phase Impairments: Poor awareness of bolus Oral Phase Functional Implications: Oral holding;Prolonged oral transit Pharyngeal Phase Impairments: Suspected delayed Swallow   Solid     Solid: Not tested     Crystal Scarberry I. Hardin Negus, [place], [place] Office number [phone]  Horton Marshall 09/01/2022,11:29 AM

## 2022-09-01 NOTE — ED Notes (Signed)
Messaged provider in regards to pt mentation and swallow evaluation. Provider changed oral route of medications and added labs.

## 2022-09-02 DIAGNOSIS — L03116 Cellulitis of left lower limb: Secondary | ICD-10-CM | POA: Diagnosis not present

## 2022-09-02 LAB — BASIC METABOLIC PANEL
Anion gap: 13 (ref 5–15)
Anion gap: 14 (ref 5–15)
Anion gap: 13 (ref 5–15)
Anion gap: 9 (ref 5–15)
BUN: 12 mg/dL (ref 6–20)
BUN: 12 mg/dL (ref 6–20)
BUN: 11 mg/dL (ref 6–20)
BUN: 11 mg/dL (ref 6–20)
CO2: 25 mmol/L (ref 22–32)
CO2: 23 mmol/L (ref 22–32)
CO2: 25 mmol/L (ref 22–32)
CO2: 27 mmol/L (ref 22–32)
Calcium: 7.9 mg/dL — ABNORMAL LOW (ref 8.9–10.3)
Calcium: 8.4 mg/dL — ABNORMAL LOW (ref 8.9–10.3)
Calcium: 8.4 mg/dL — ABNORMAL LOW (ref 8.9–10.3)
Calcium: 8.4 mg/dL — ABNORMAL LOW (ref 8.9–10.3)
Chloride: 88 mmol/L — ABNORMAL LOW (ref 98–111)
Chloride: 91 mmol/L — ABNORMAL LOW (ref 98–111)
Chloride: 96 mmol/L — ABNORMAL LOW (ref 98–111)
Chloride: 97 mmol/L — ABNORMAL LOW (ref 98–111)
Creatinine, Ser: 1.22 mg/dL — ABNORMAL HIGH (ref 0.44–1.00)
Creatinine, Ser: 1.11 mg/dL — ABNORMAL HIGH (ref 0.44–1.00)
Creatinine, Ser: 1.12 mg/dL — ABNORMAL HIGH (ref 0.44–1.00)
Creatinine, Ser: 1.02 mg/dL — ABNORMAL HIGH (ref 0.44–1.00)
GFR, Estimated: 52 mL/min — ABNORMAL LOW (ref >60)
GFR, Estimated: 58 mL/min — ABNORMAL LOW (ref >60)
GFR, Estimated: 57 mL/min — ABNORMAL LOW (ref >60)
GFR, Estimated: >60 mL/min (ref >60)
Glucose, Bld: 223 mg/dL — ABNORMAL HIGH (ref 70–99)
Glucose, Bld: 305 mg/dL — ABNORMAL HIGH (ref 70–99)
Glucose, Bld: 303 mg/dL — ABNORMAL HIGH (ref 70–99)
Glucose, Bld: 226 mg/dL — ABNORMAL HIGH (ref 70–99)
Potassium: 3.2 mmol/L — ABNORMAL LOW (ref 3.5–5.1)
Potassium: 3 mmol/L — ABNORMAL LOW (ref 3.5–5.1)
Potassium: 3.1 mmol/L — ABNORMAL LOW (ref 3.5–5.1)
Potassium: 3.7 mmol/L (ref 3.5–5.1)
Sodium: 126 mmol/L — ABNORMAL LOW (ref 135–145)
Sodium: 128 mmol/L — ABNORMAL LOW (ref 135–145)
Sodium: 134 mmol/L — ABNORMAL LOW (ref 135–145)
Sodium: 133 mmol/L — ABNORMAL LOW (ref 135–145)

## 2022-09-02 LAB — GLUCOSE, CAPILLARY
Glucose-Capillary: 213 mg/dL — ABNORMAL HIGH (ref 70–99)
Glucose-Capillary: 262 mg/dL — ABNORMAL HIGH (ref 70–99)
Glucose-Capillary: 272 mg/dL — ABNORMAL HIGH (ref 70–99)
Glucose-Capillary: 274 mg/dL — ABNORMAL HIGH (ref 70–99)
Glucose-Capillary: 193 mg/dL — ABNORMAL HIGH (ref 70–99)

## 2022-09-02 LAB — MRSA NEXT GEN BY PCR, NASAL: MRSA by PCR Next Gen: DETECTED — AB

## 2022-09-02 MED ORDER — INSULIN ASPART 100 UNIT/ML IJ SOLN
3.0000 [IU] | Freq: Three times a day (TID) | INTRAMUSCULAR | Status: DC
  Administered 2022-09-02: 3 [IU] via SUBCUTANEOUS

## 2022-09-02 MED ORDER — POTASSIUM CHLORIDE 10 MEQ/100ML IV SOLN
10.0000 meq | INTRAVENOUS | Status: AC
  Administered 2022-09-02 (×2): 10 meq via INTRAVENOUS
  Filled 2022-09-02 (×4): qty 100

## 2022-09-02 MED ORDER — POTASSIUM CHLORIDE CRYS ER 20 MEQ PO TBCR
40.0000 meq | EXTENDED_RELEASE_TABLET | Freq: Once | ORAL | Status: AC
Start: 2022-09-02 — End: 2022-09-02
  Administered 2022-09-02: 40 meq via ORAL
  Filled 2022-09-02: qty 2

## 2022-09-02 MED ORDER — INSULIN GLARGINE-YFGN 100 UNIT/ML ~~LOC~~ SOLN
8.0000 [IU] | Freq: Every day | SUBCUTANEOUS | Status: DC
  Filled 2022-09-02: qty 0.08

## 2022-09-02 MED ORDER — INSULIN ASPART 100 UNIT/ML IJ SOLN
0.0000 [IU] | Freq: Three times a day (TID) | INTRAMUSCULAR | Status: DC
  Administered 2022-09-02: 8 [IU] via SUBCUTANEOUS
  Administered 2022-09-03: 2 [IU] via SUBCUTANEOUS
  Administered 2022-09-03 – 2022-09-04 (×3): 3 [IU] via SUBCUTANEOUS
  Administered 2022-09-04: 5 [IU] via SUBCUTANEOUS
  Administered 2022-09-04: 2 [IU] via SUBCUTANEOUS
  Administered 2022-09-05: 5 [IU] via SUBCUTANEOUS
  Administered 2022-09-05 – 2022-09-06 (×5): 3 [IU] via SUBCUTANEOUS

## 2022-09-02 MED ORDER — CHLORHEXIDINE GLUCONATE CLOTH 2 % EX PADS
6.0000 | MEDICATED_PAD | Freq: Every day | CUTANEOUS | Status: DC
  Administered 2022-09-03 – 2022-09-06 (×3): 6 via TOPICAL

## 2022-09-02 MED ORDER — DULOXETINE HCL 60 MG PO CPEP
60.0000 mg | ORAL_CAPSULE | Freq: Every day | ORAL | Status: DC
  Administered 2022-09-03 – 2022-09-06 (×4): 60 mg via ORAL
  Filled 2022-09-02 (×4): qty 1

## 2022-09-02 MED ORDER — INSULIN GLARGINE-YFGN 100 UNIT/ML ~~LOC~~ SOLN
10.0000 [IU] | Freq: Every day | SUBCUTANEOUS | Status: DC
  Administered 2022-09-02 – 2022-09-06 (×5): 10 [IU] via SUBCUTANEOUS
  Filled 2022-09-02 (×6): qty 0.1

## 2022-09-02 MED ORDER — CARIPRAZINE HCL 1.5 MG PO CAPS
6.0000 mg | ORAL_CAPSULE | Freq: Every day | ORAL | Status: DC
  Administered 2022-09-02 – 2022-09-03 (×2): 6 mg via ORAL
  Filled 2022-09-02 (×4): qty 4

## 2022-09-02 MED ORDER — LEVOTHYROXINE SODIUM 25 MCG PO TABS
25.0000 ug | ORAL_TABLET | Freq: Every day | ORAL | Status: DC
Start: 2022-09-03 — End: 2022-09-07
  Administered 2022-09-03 – 2022-09-06 (×4): 25 ug via ORAL
  Filled 2022-09-02 (×4): qty 1

## 2022-09-02 MED ORDER — POTASSIUM CHLORIDE CRYS ER 20 MEQ PO TBCR
40.0000 meq | EXTENDED_RELEASE_TABLET | Freq: Once | ORAL | Status: AC
  Administered 2022-09-02: 40 meq via ORAL
  Filled 2022-09-02: qty 2

## 2022-09-02 MED ORDER — GABAPENTIN 300 MG PO CAPS
300.0000 mg | ORAL_CAPSULE | Freq: Every day | ORAL | Status: DC
  Administered 2022-09-02 – 2022-09-06 (×5): 300 mg via ORAL
  Filled 2022-09-02 (×5): qty 1

## 2022-09-02 MED ORDER — POTASSIUM CHLORIDE 10 MEQ/100ML IV SOLN
10.0000 meq | INTRAVENOUS | Status: AC
  Administered 2022-09-02 (×2): 10 meq via INTRAVENOUS

## 2022-09-02 MED ORDER — MUPIROCIN 2 % EX OINT
1.0000 | TOPICAL_OINTMENT | Freq: Two times a day (BID) | CUTANEOUS | Status: DC
  Administered 2022-09-03 – 2022-09-06 (×9): 1 via NASAL
  Filled 2022-09-02 (×3): qty 22

## 2022-09-02 MED ORDER — INSULIN ASPART 100 UNIT/ML IJ SOLN
0.0000 [IU] | Freq: Every day | INTRAMUSCULAR | Status: DC
  Administered 2022-09-05: 2 [IU] via SUBCUTANEOUS

## 2022-09-02 MED ORDER — INSULIN ASPART 100 UNIT/ML IJ SOLN: 6.0000 [IU] | Freq: Three times a day (TID) | INTRAMUSCULAR | Status: DC

## 2022-09-02 MED ORDER — VALBENAZINE TOSYLATE 40 MG PO CAPS
80.0000 mg | ORAL_CAPSULE | Freq: Every day | ORAL | Status: DC
  Administered 2022-09-02 – 2022-09-06 (×5): 80 mg via ORAL
  Filled 2022-09-02 (×6): qty 2

## 2022-09-02 MED ORDER — POTASSIUM CHLORIDE 20 MEQ PO PACK
40.0000 meq | PACK | Freq: Once | ORAL | Status: DC
  Filled 2022-09-02: qty 2

## 2022-09-02 NOTE — Progress Notes (Signed)
Spoke to RN regarding PIV start. Pt currently has a functioning IV.  Potassium runs and IVF compatible. Abx run for 30 minutes, and not due until this evening. No second IV is warranted. Verbalized understanding.

## 2022-09-02 NOTE — Care Management Important Message (Signed)
Important Message  Patient Details  Name: [name] MRN: [id number] Date of Birth: 01/09/1966   Medicare Important Message Given:  Yes     Hannah Beat 09/02/2022, 1:08 PM

## 2022-09-02 NOTE — Progress Notes (Signed)
Progress Note Patient: [name] JEH:[id number] DOB: 1966/03/01 DOA: 08/31/2022  DOS: the patient was seen and examined on 09/02/2022  Brief hospital course: PMH of COPD, type II DM, hypothyroidism, schizophrenia, OSA not on CPAP, colon cancer, presented to hospital with complaints of left knee swelling and pain.  Concern about septic arthritis and bursitis.  Orthopedic was consulted. On 9/7 patient is significantly drowsy and lethargic.  Holding multiple psychotropic medications.  Mentation improved with holding medications.  Currently psychiatry consult for further assistance with medications.  Assessment and Plan: Cellulitis with concern for possible septic infrapatellar bursitis Patient presents with complaints of redness and swelling right knee over the last 2 months. X-rays revealed soft tissue swelling without signs of osteomyelitis.  Patient has been ordered Rocephin and vancomycin, but developed rash on Rocephin for which it was stopped.   ED provider attempted aspiration of the knee joint without success. Orthopedic consulted. Continue pain control. MRI negative for osteomyelitis.  Negative for septic arthritis. Continue with IV antibiotics and follow-up on cultures.  Currently on Unasyn.   Lactic acidosis  Acute.  Lactic acid elevated at 4.8-> 3.4.  Treated with IV fluids.  Continue.   Normocytic anemia Hemoglobin stable.  Previously was 13 currently 9. No active bleeding seen. Monitor.   Hyponatremia Severely low. Prior sodium level normal. Patient was treated with half-normal saline. Sodium level improving.  We will continue with normal saline. Monitor.   Hypokalemia Replacing potassium with IV. Monitor. Recheck.  Hypomagnesemia corrected as well.   Hypocalcemia Treated with calcium gluconate. Monitor.   Nausea and vomiting Resolved for now. Monitor.   Essential hypertension Currently hypotensive. Holding all blood pressure medication. Holding  diuretics. Receiving IV fluids.   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 Continue sliding scale insulin every 4 hours as the patient will remain on dysphagia 1 diet or n.p.o. Discontinue gabapentin due to somnolence.   Renal insufficiency/possible acute kidney injury Patient presents with creatinine of 1.2.  Baseline creatinine previously noted to be 0.92 on 02/10/2022.  This is not greater than 0.3 increased from previous baseline to suggest acute kidney injury at this time. Monitor with IV fluids.   Depression and anxiety Schizophrenia. Tardive dyskinesia. Acute metabolic/toxic encephalopathy Patient is significantly drowsy and lethargic on 9/7. Suspect this is multi factorial although most likely polypharmacy related. No focal deficit. Reducing Seroquel dose from 800 mg to 400 mg. Psychiatry consult. Resume other psychotropic medications.   Hypothyroidism Normal TSH normal free.  Monitor. Continue Synthroid starting tomorrow.   Chronic pain -Continue oxycodone as needed for pain minimize narcotics.   PAD -Continue statin   History of colon cancer Status post resection in 2016.  Patient denies any reports of bleeding.   Nicotine use Patient currently uses electronic cigarettes   GERD -Continue Protonix   Obesity Body mass index is 34.95 kg/m.  Placing the pt at higher risk of poor outcomes.  Subjective: No nausea no vomiting.  No fever no chills.  Mentation more alert.  Denies any acute complaint other than knee pain.  Wants to eat more food.  Physical Exam: Vitals:   09/01/22 2054 09/02/22 0612 09/02/22 0735 09/02/22 1500  BP: (!) 108/58 (!) 145/47 (!) 150/56 (!) 140/67  Pulse:   83 81  Resp: '14 16 17 13  '$ Temp: 97.8 F (36.6 C) 98.1 F (36.7 C) 98 F (36.7 C) 98.6 F (37 C)  TempSrc:   Oral Oral  SpO2: 98% 98%  98%  Weight:  Height:       General: Appear in mild distress; no visible Abnormal Neck Mass Or  lumps, Conjunctiva normal Cardiovascular: S1 and S2 Present, aortic systolic Murmur, Respiratory: good respiratory effort, Bilateral Air entry present and CTA, no Crackles, no wheezes Abdomen: Bowel Sound present, Non tender  Extremities: no Pedal edema, right BKA Neurology: alert and oriented to time, place, and person  Gait not checked due to patient safety concerns   Data Reviewed: I have Reviewed nursing notes, Vitals, and Lab results since pt's last encounter. Pertinent lab results CBC, BMP I have ordered test including CBC, BMP    Family Communication: Friend at bedside.  Patient allowed permission to discuss medical plan with a friend.  Disposition: Status is: Inpatient Remains inpatient appropriate because: Correcting sodium and electrolytes.  Medication adjustment for her depression and mood disorder.  Author: Berle Mull, MD 09/02/2022 7:08 PM  Please look on [email or web address] to find out who is on call.
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
## 2022-09-02 NOTE — Progress Notes (Signed)
Speech Language Pathology Treatment: Dysphagia  Patient Details Name: [name] MRN: [id number] DOB: 03-Apr-1966 Today's Date: 09/02/2022 Time: 6812-7517 SLP Time Calculation (min) (ACUTE ONLY): 17 min  Assessment / Plan / Recommendation Clinical Impression  Pt was seen for dysphagia treatment. Her mentation is notably improved compared to yesterday. She was alert and cooperative and communicated without difficulty. Pt denied any significant difficulty swallowing including signs of aspiration. Pt stated that she does not wear her dentures since they are painful. Per the pt, she "takes her time" with meals and is able to consume most solids despite her edentulous status. Pt tolerated dysphagia 3 solids, mixed consistency boluses (Sarae., peaches with thin liquids), regular texture solids, and consecutive swallows of thin liquids without overt s/s of aspiration. Mastication was mildly prolonged, but functional and oral clearance was adequate. Pt's diet will be advanced to regular texture solids and thin liquids. SLP will follow briefly to ensure tolerance.    HPI HPI: Pt is a 56 y.o. female who presented with c/o swelling and pain of her right knee.  Pt reported symptoms started approximately 2 months PTA with swelling and pain of her left knee. 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      SLP Plan  Continue with current plan of care      Recommendations for follow up therapy are one component of a multi-disciplinary discharge planning process, led by the attending physician.  Recommendations may be updated based on patient status, additional functional criteria and insurance authorization.    Recommendations  Diet recommendations: Regular;Thin liquid Liquids provided via: Cup;Straw Medication Administration: Whole meds with liquid Supervision: Patient able to self feed Postural Changes  and/or Swallow Maneuvers: Seated upright 90 degrees                Oral Care Recommendations: Oral care BID Follow Up Recommendations: No SLP follow up SLP Visit Diagnosis: Dysphagia, unspecified (R13.10) Plan: Continue with current plan of care         [name] [name], [place], [place] Office number [phone]   [name]  09/02/2022, 4:21 PM

## 2022-09-03 DIAGNOSIS — L03116 Cellulitis of left lower limb: Secondary | ICD-10-CM | POA: Diagnosis not present

## 2022-09-03 DIAGNOSIS — F3181 Bipolar II disorder: Secondary | ICD-10-CM

## 2022-09-03 LAB — CBC WITH DIFFERENTIAL/PLATELET
Abs Immature Granulocytes: 0.02 10*3/uL (ref 0.00–0.07)
Basophils Absolute: 0 10*3/uL (ref 0.0–0.1)
Basophils Relative: 1 %
Eosinophils Absolute: 0.2 10*3/uL (ref 0.0–0.5)
Eosinophils Relative: 4 %
HCT: 23.8 % — ABNORMAL LOW (ref 36.0–46.0)
Hemoglobin: 8.4 g/dL — ABNORMAL LOW (ref 12.0–15.0)
Immature Granulocytes: 0 %
Lymphocytes Relative: 27 %
Lymphs Abs: 1.9 10*3/uL (ref 0.7–4.0)
MCH: 31.1 pg (ref 26.0–34.0)
MCHC: 35.3 g/dL (ref 30.0–36.0)
MCV: 88.1 fL (ref 80.0–100.0)
Monocytes Absolute: 0.4 10*3/uL (ref 0.1–1.0)
Monocytes Relative: 6 %
Neutro Abs: 4.3 10*3/uL (ref 1.7–7.7)
Neutrophils Relative %: 62 %
Platelets: 275 10*3/uL (ref 150–400)
RBC: 2.7 MIL/uL — ABNORMAL LOW (ref 3.87–5.11)
RDW: 12.7 % (ref 11.5–15.5)
WBC: 7 10*3/uL (ref 4.0–10.5)
nRBC: 0 % (ref 0.0–0.2)

## 2022-09-03 LAB — COMPREHENSIVE METABOLIC PANEL
ALT: 14 U/L (ref 0–44)
AST: 15 U/L (ref 15–41)
Albumin: 2.7 g/dL — ABNORMAL LOW (ref 3.5–5.0)
Alkaline Phosphatase: 47 U/L (ref 38–126)
Anion gap: 7 (ref 5–15)
BUN: 8 mg/dL (ref 6–20)
CO2: 25 mmol/L (ref 22–32)
Calcium: 8 mg/dL — ABNORMAL LOW (ref 8.9–10.3)
Chloride: 106 mmol/L (ref 98–111)
Creatinine, Ser: 1.08 mg/dL — ABNORMAL HIGH (ref 0.44–1.00)
GFR, Estimated: 60 mL/min — ABNORMAL LOW (ref >60)
Glucose, Bld: 157 mg/dL — ABNORMAL HIGH (ref 70–99)
Potassium: 4.4 mmol/L (ref 3.5–5.1)
Sodium: 138 mmol/L (ref 135–145)
Total Bilirubin: 0.1 mg/dL — ABNORMAL LOW (ref 0.3–1.2)
Total Protein: 5 g/dL — ABNORMAL LOW (ref 6.5–8.1)

## 2022-09-03 LAB — AEROBIC CULTURE W GRAM STAIN (SUPERFICIAL SPECIMEN): Gram Stain: NONE SEEN

## 2022-09-03 LAB — GLUCOSE, CAPILLARY
Glucose-Capillary: 160 mg/dL — ABNORMAL HIGH (ref 70–99)
Glucose-Capillary: 198 mg/dL — ABNORMAL HIGH (ref 70–99)
Glucose-Capillary: 149 mg/dL — ABNORMAL HIGH (ref 70–99)
Glucose-Capillary: 85 mg/dL (ref 70–99)
Glucose-Capillary: 131 mg/dL — ABNORMAL HIGH (ref 70–99)
Glucose-Capillary: 139 mg/dL — ABNORMAL HIGH (ref 70–99)

## 2022-09-03 LAB — MAGNESIUM: Magnesium: 1.2 mg/dL — ABNORMAL LOW (ref 1.7–2.4)

## 2022-09-03 MED ORDER — DOXYCYCLINE HYCLATE 100 MG PO TABS
100.0000 mg | ORAL_TABLET | Freq: Two times a day (BID) | ORAL | Status: DC
  Administered 2022-09-03 – 2022-09-06 (×8): 100 mg via ORAL
  Filled 2022-09-03 (×9): qty 1

## 2022-09-03 MED ORDER — MAGNESIUM SULFATE 2 GM/50ML IV SOLN: 2.0000 g | Freq: Once | INTRAVENOUS | Status: DC

## 2022-09-03 MED ORDER — POTASSIUM CHLORIDE 10 MEQ/100ML IV SOLN
INTRAVENOUS | Status: AC
  Filled 2022-09-03: qty 100

## 2022-09-03 MED ORDER — MAGNESIUM SULFATE 4 GM/100ML IV SOLN
4.0000 g | Freq: Once | INTRAVENOUS | Status: AC
Start: 2022-09-03 — End: 2022-09-03
  Administered 2022-09-03: 4 g via INTRAVENOUS
  Filled 2022-09-03 (×2): qty 100

## 2022-09-03 MED ORDER — INSULIN ASPART 100 UNIT/ML IJ SOLN
10.0000 [IU] | Freq: Three times a day (TID) | INTRAMUSCULAR | Status: DC
  Administered 2022-09-03 (×3): 10 [IU] via SUBCUTANEOUS

## 2022-09-03 MED ORDER — INSULIN ASPART 100 UNIT/ML IJ SOLN
6.0000 [IU] | Freq: Three times a day (TID) | INTRAMUSCULAR | Status: DC
  Administered 2022-09-04 – 2022-09-06 (×9): 6 [IU] via SUBCUTANEOUS

## 2022-09-03 NOTE — Consult Note (Addendum)
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 Hallucinations:Hallucinations: None  Ideas of Reference:None  Suicidal Thoughts:Suicidal Thoughts: No  Homicidal Thoughts:Homicidal Thoughts: No   Sensorium  Memory:Immediate Good; Recent Good; Remote Good  Judgment:Fair  Insight:Fair   Executive Functions  Concentration:Poor  Attention Span:Poor  Recall:No data recorded Fund of Knowledge:No data recorded Language:No data recorded  Psychomotor Activity  Psychomotor Activity:Psychomotor Activity: Decreased   Assets  Assets:No data recorded  Sleep  Sleep:Sleep: Fair   Physical  Exam: Physical Exam Vitals reviewed.    Review of Systems  Psychiatric/Behavioral:  Positive for depression. Negative for hallucinations, substance abuse and suicidal ideas. The patient is nervous/anxious and has insomnia.    Blood pressure 118/60, pulse 79, temperature 98 F (36.7 C), temperature source Oral, resp. rate 11, height '5\' 5"'$  (1.651 m), weight 95.3 kg, SpO2 98 %. Body mass index is 34.95 kg/m.  Treatment Plan Summary:  Assessment: -bipolar d/o type 2 -ptsd   Pt was restarted on full list of home psychiatric meds on admission; however she had not been taking some of them such as ?seroquel, cymbalta, gabapentin. Pt was apparently very sedated with restarting these home meds. Primary team decreased seroquel to 400 mg qhs which seems to be helpful. I had extensive discussion with the pt and Millie, about further medication changes. We discussed that as far as oversedation, pt appears to be at baseline (which is still tired/sedated during the day). Mood is depressed.   To address the sedation, we discussed a few things: Pt needs sleep study and cpap. If dx with osa, and using cpap, she could also be a candidate for a stimulant like modafinil to help with Energy, motivation, daytime sedation, and her depressed mood.  We could further reduce seroquel but we must balance treating her depression and insomnia with this. Ultimately it was decided that pt should be continued on current dose of seroquel. In the future this could be reduced. Reducing vraylar and/or moving to morning. Vraylar causes sedation in about 10% of people and also causes insomnia in about 10% of people - vraylar therefore could be helping her fall asleep or could be impairing her ability to initiate and maintain sleep. Also, being on 2 antipsychotic mood stabilizers, especially in a person with TD, should be avoided. Being on 2 antipsychotic mood stabilizers should be avoided in general and this was discussed with the  patient. I talked with patient about decreasing vraylar dose and moving to the morning, but she ultimately would like to continue current dose at bedtime. Reduction of dose or other changes to this medication could be trialed as outpatient.  Lastly there could be some impact of her h/o CVA on her level of  alertness. Her depression itself could be causing lower energy and lower motivation  To address depressed mood (bipolar depression):  Cymbalta was already re-started - this is her primary antidepressant - monitor response to this Impact of untreated OSA on depression Vraylar at this higher dose is likely not helping treat her bipolar depression. See above about lowering the dose to 4.5 mg or 3 mg once daily  Treating pain - consider changing gabapentin to 300 mg tid.  Recommending starting psychotherapy     Plan: -pt does not require inpatient psychiatric hospitalization once medically cleared -recommend sleep study during current admission or after discharge for osa and treatment  -continue seroquel 400 mg qhs  -continue vraylar 6 mg qhs - as outpatient could consider lowering the dose, and/or moving to the morning time. Ultimately, it would be ideal for this patient to be on only 1 antipsychotic mood stabilizer. As pt has h/o TD, she would be more ideally treated with seroquel than vraylar for bipolar depression, of course with there diabetes vraylar is more ideal, but TD is a permanent and progressive condition.  -continue cymbalta 60 mg once daily  -continue lower dose of gabapentin 300 mg qhs. Consider making this 300 mg tid  -continue ingrezza for TD  -continue OFF dardorexant and eszopliclone during admission and after discharge.  -f/u with outpatient psychiatrist - pt could benefit from a psychiatrist that dose telemedicine due to her limited mobility    Disposition: No evidence of imminent risk to self or others at present.   Patient does not meet criteria for psychiatric inpatient  admission. Supportive therapy provided about ongoing stressors. Discussed crisis plan, support from social network, calling 911, coming to the Emergency Department, and calling Suicide Hotline.  Psychiatric C/L service will sign-off.    Plan discussed with patient and agreed upon by the patient and this Probation officer.  Christoper Allegra, MD 09/03/2022 1:15 PM  Total Time Spent in Direct Patient Care:  I personally spent 60 minutes on the unit in direct patient care. The direct patient care time included face-to-face time with the patient, reviewing the patient's chart, communicating with other professionals, and coordinating care. Greater than 50% of this time was spent in counseling or coordinating care with the patient regarding goals of hospitalization, psycho-education, and discharge planning needs.   Janine Limbo, MD Psychiatrist

## 2022-09-03 NOTE — Plan of Care (Signed)
  Problem: Education: Goal: Ability to describe self-care measures that may prevent or decrease complications (Diabetes Survival Skills Education) will improve Outcome: Not Met (add Reason)   Problem: Coping: Goal: Ability to adjust to condition or change in health will improve Outcome: Progressing   Problem: Fluid Volume: Goal: Ability to maintain a balanced intake and output will improve Outcome: Progressing   Problem: Health Behavior/Discharge Planning: Goal: Ability to identify and utilize available resources and services will improve Outcome: Not Met (add Reason)   Problem: Metabolic: Goal: Ability to maintain appropriate glucose levels will improve Outcome: Not Met (add Reason)   Problem: Nutritional: Goal: Maintenance of adequate nutrition will improve Outcome: Progressing   Problem: Skin Integrity: Goal: Risk for impaired skin integrity will decrease Outcome: Progressing   Problem: Tissue Perfusion: Goal: Adequacy of tissue perfusion will improve Outcome: Progressing   Problem: Tissue Perfusion: Goal: Adequacy of tissue perfusion will improve Outcome: Progressing

## 2022-09-03 NOTE — Progress Notes (Signed)
Progress Note Patient: [name] RDE:[id number] DOB: 10-13-1966 DOA: 08/31/2022  DOS: the patient was seen and examined on 09/03/2022  Brief hospital course: PMH of COPD, type II DM, hypothyroidism, schizophrenia, OSA not on CPAP, colon cancer, presented to hospital with complaints of left knee swelling and pain.  Concern about septic arthritis and bursitis.  Orthopedic was consulted. On 9/7 patient is significantly drowsy and lethargic.  Holding multiple psychotropic medications.  Mentation improved with holding medications.  Currently psychiatry consult for further assistance with medications. Assessment and Plan: Cellulitis with concern for possible septic infrapatellar bursitis Patient presents with complaints of redness and swelling right knee over the last 2 months. X-rays revealed soft tissue swelling without signs of osteomyelitis.  Patient has been ordered Rocephin and vancomycin, but developed rash on Rocephin for which it was stopped.   ED provider attempted aspiration of the knee joint without success. Orthopedic consulted. Continue pain control. MRI negative for osteomyelitis.  Negative for septic arthritis. Wound culture positive for MRSA.  Switch to doxycycline only.  Depression and anxiety Tardive dyskinesia. Acute metabolic/toxic encephalopathy Concern for schizophrenia.-Ruled out.  No evidence. Patient is significantly drowsy and lethargic on 9/7. Suspect this is multi factorial although most likely polypharmacy related. No focal deficit. Patient is on multiple psychotropic medications. Highly appreciate psychiatry consultation. For now we will continue Seroquel at 400 mg nightly. We will discuss with patient again to decrease Vraylar to 3 mg nightly. Continue Cymbalta 60 mg once daily. We will continue gabapentin 3 mg nightly. Continue Ingrezza for TD. Outpatient follow-up with psychiatrist.  Uncontrolled diabetes mellitus type 2 with with hyperglycemia with  neuropathy with long-term insulin use Hemoglobin A1c 9.7 showing poor control. Continue sliding scale insulin for now.  Increased Lantus to 10 units.  Acute kidney injury Patient presents with creatinine of 1.2.  Worsened to 1.3 due to hypotension Baseline creatinine previously noted to be 0.92 on 02/10/2022.   Treated with IV fluids.   Lactic acidosis  Acute.  Lactic acid elevated at 4.8-> 3.4.  Treated with IV fluids.  Continue.   Normocytic anemia Hemoglobin stable.  Previously was 13 currently stable around 8. No active bleeding seen.   Hyponatremia On admission sodium 121.  Currently normal.  Treated with IV normal saline. Most likely from poor p.o. intake.   Hypokalemia Replacing potassium with IV.  Hypomagnesemia  corrected as well.   Hypocalcemia Treated with calcium gluconate. Monitor.   Nausea and vomiting Resolved for now. Monitor.   Essential hypertension Was hypotensive. Holding all blood pressure medication. Holding diuretics.  Hypothyroidism Normal TSH normal free.  Monitor. Continue Synthroid.  Chronic pain Continue oxycodone as needed for pain minimize narcotics.   PAD -Continue statin   History of colon cancer Status post resection in 2016.  Patient denies any reports of bleeding.   Nicotine use Patient currently uses electronic cigarettes   GERD -Continue Protonix   Obesity Body mass index is 34.95 kg/m.  Placing the pt at higher risk of poor outcomes.  OSA. Not on CPAP. Does not want to use CPAP. Placing the patient has a poor outcome due to her ongoing somnolence.  Subjective: Drowsy again.  No nausea no vomiting no fever no chills.  Sleeping multiple times during the conversation requiring frequent attempt to wake her up.  Physical Exam: Vitals:   09/02/22 1500 09/02/22 1923 09/03/22 0648 09/03/22 0728  BP: (!) 140/67 (!) 155/62 132/63 118/60  Pulse: 81 81  79  Resp: '13 17 11 '$ 11  Temp: 98.6 F (37 C) 98.1 F (36.7 C)  98.3 F (36.8 C) 98 F (36.7 C)  TempSrc: Oral  Oral Oral  SpO2: 98% 100% 100% 98%  Weight:      Height:       General: Appear in moderate distress; no visible Abnormal Neck Mass Or lumps, Conjunctiva normal Cardiovascular: S1 and S2 Present, no Murmur, Respiratory: good respiratory effort, Bilateral Air entry present and CTA, no Crackles, no wheezes Abdomen: Bowel Sound present, Non tender  Extremities: left Pedal edema Neurology: lethargic and oriented to time, place, and person  Gait not checked due to patient safety concerns   Data Reviewed: I have Reviewed nursing notes, Vitals, and Lab results since pt's last encounter. Pertinent lab results CBC and BMP I have ordered test including CBC and BMP I have reviewed the last note from psychiatry,    Family Communication: No one at bedside  Disposition: Status is: Inpatient Remains inpatient appropriate because: Mentation still not back to normal.  Requiring medication adjustment.  Author: Berle Mull, MD 09/03/2022 5:24 PM  Please look on [email or web address] to find out who is on call.

## 2022-09-03 NOTE — Evaluation (Signed)
Occupational Therapy Evaluation Patient Details Name: [name] MRN: [id number] DOB: [birth year] Today's Date: 09/03/2022   History of Present Illness 56 yo female admitted 9/6 with left knee pain and edema s/p unsuccessful aspiration grossly 2wks PTA as well as in ED. Pt with left knee cellulitis. PMHx: COPD, DM, neuropathy, hypothyroidism, colon CA, cellulits, schizophrenia   Clinical Impression   Shawanda was evaluated s/p the above admission list, she is generally mod I at baseline with use of WC, floor transfers and crawling. Pt reports her WC does not fit through the interior doors of the home and her bedroom has 1 step with a steep ramp that she crawls down to access her bedroom and bathroom. Pt is able to transfer herself from the floor to the toilet at baseline. Upon evaluation pt required min A +2 for transfers and mobility with R prosthetic donned and L post-op shoe donned. She requires up to min A for ADLs, and stated she usually completed ADLs while sitting on the floor at baseline. Pt will continue to benefit from OT acutely. Recommend d/c to home with [facility] and family assist.      Recommendations for follow up therapy are one component of a multi-disciplinary discharge planning process, led by the attending physician.  Recommendations may be updated based on patient status, additional functional criteria and insurance authorization.   Follow Up Recommendations  Home health OT (potentially SNF pending progression)    Assistance Recommended at Discharge Intermittent Supervision/Assistance  Patient can return home with the following A little help with walking and/or transfers;A little help with bathing/dressing/bathroom;Help with stairs or ramp for entrance;Assist for transportation;Assistance with cooking/housework    Functional Status Assessment  Patient has had a recent decline in their functional status and demonstrates the ability to make significant improvements in function in  a reasonable and predictable amount of time.  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 Restrictions Weight Bearing Restrictions: No      Mobility Bed Mobility Overal bed mobility: Needs Assistance Bed Mobility: Supine to Sit     Supine to sit: Min guard          Transfers Overall transfer level: Needs assistance Equipment used: Rolling walker (2 wheels) Transfers: Sit to/from Stand Sit to Stand: Min assist, +2 safety/equipment, From elevated surface           General transfer comment: with R prosthetic donned      Balance Overall balance assessment: Needs assistance, History of Falls Sitting-balance support: Feet supported Sitting balance-Leahy Scale: Good     Standing balance support: Bilateral upper extremity supported Standing balance-Leahy Scale: Poor                             ADL either performed or assessed with clinical judgement   ADL Overall ADL's : Needs assistance/impaired Eating/Feeding: Independent;Sitting   Grooming: Set up;Sitting   Upper Body Bathing: Set up;Sitting   Lower Body Bathing: Min guard;Sitting/lateral leans   Upper Body Dressing : Set up;Sitting   Lower Body Dressing: Minimal assistance;Sit to/from stand   Toilet Transfer: Minimal assistance;Rolling walker (2 wheels);BSC/3in1   Toileting- Clothing Manipulation and Hygiene: Supervision/safety;Sitting/lateral lean       Functional mobility during ADLs: Minimal assistance;Rolling walker (2 wheels)       Vision Baseline Vision/History: 0 No visual deficits  Vision Assessment?: Vision impaired- to be further tested in functional context Additional Comments: seemingly WFL, would benefit from further assessment     Perception     Praxis      Pertinent Vitals/Pain Pain Assessment Pain Assessment: Faces Faces Pain  Scale: No hurt Pain Intervention(s): Monitored during session     Hand Dominance     Extremity/Trunk Assessment Upper Extremity Assessment Upper Extremity Assessment: Overall WFL for tasks assessed   Lower Extremity Assessment Lower Extremity Assessment: Defer to PT evaluation   Cervical / Trunk Assessment Cervical / Trunk Assessment: Normal   Communication Communication Communication: No difficulties   Cognition Arousal/Alertness: Awake/alert Behavior During Therapy: Flat affect Overall Cognitive Status: Within Functional Limits for tasks assessed                                 General Comments: pt holding eyes closed the majority of the session but overall followign commands and answering questions appropriately.     General Comments  VSS on RA, sister present and supportive    Exercises     Shoulder Instructions      Home Living Family/patient expects to be discharged to:: Private residence Living Arrangements: Alone   Type of Home: House Home Access: Ramped entrance     Home Layout: Multi-level Alternate Level Stairs-Number of Steps: 1 step/ramp down to her bedroom/bathroom   Bathroom Shower/Tub: Sponge bathes at baseline   Bathroom Toilet: Standard Bathroom Accessibility: No       Additional Comments: voc rehab planning to modify the home (soon?); WC does not fit through doorways of home. Pt completes floor transfers and crawls to access bedroom/bed/bathroom/toilet      Prior Functioning/Environment Prior Level of Function : Independent/Modified Independent;Needs assist             Mobility Comments: mobilizes at wc level, transfers out of wc to floor and crawls to bedroom/bathroom. Is able to transfer from floor to toilet indep ADLs Comments: overall Mod I, difficulty with above head tasks.        OT Problem List: Decreased strength;Decreased range of motion;Decreased activity tolerance;Impaired balance (sitting and/or  standing)      OT Treatment/Interventions: Self-care/ADL training;Therapeutic exercise;DME and/or AE instruction;Therapeutic activities;Patient/family education;Balance training    OT Goals(Current goals can be found in the care plan section) Acute Rehab OT Goals Patient Stated Goal: home OT Goal Formulation: With patient Time For Goal Achievement: 09/17/22 Potential to Achieve Goals: Good ADL Goals Pt Will Perform Lower Body Bathing: with modified independence;sit to/from stand Pt Will Perform Lower Body Dressing: with modified independence;sit to/from stand Pt Will Transfer to Toilet: with supervision;ambulating  OT Frequency: Min 2X/week    Co-evaluation PT/OT/SLP Co-Evaluation/Treatment: Yes Reason for Co-Treatment: Complexity of the patient's impairments (multi-system involvement);For patient/therapist safety;To address functional/ADL transfers   OT goals addressed during session: ADL's and self-care      AM-PAC OT "6 Clicks" Daily Activity     Outcome Measure Help from another person eating meals?: None Help from another person taking care of personal grooming?: A Little Help from another person toileting, which includes using toliet, bedpan, or urinal?: A Little Help from another person bathing (including washing, rinsing, drying)?: A Little Help from another person to put on and taking off regular upper body clothing?: None Help from another person to put on and taking off regular lower body clothing?: A Little 6 Click Score: 20   End of Session  Equipment Utilized During Treatment: Gait belt;Rolling walker (2 wheels) Nurse Communication: Mobility status  Activity Tolerance: Patient tolerated treatment well Patient left: in chair;with call bell/phone within reach;with family/visitor present;with chair alarm set  OT Visit Diagnosis: Unsteadiness on feet (R26.81);Other abnormalities of gait and mobility (R26.89);Muscle weakness (generalized) (M62.81);History of falling  (Z91.81)                Time: 1021-1173 OT Time Calculation (min): 32 min Charges:  OT General Charges $OT Visit: 1 Visit OT Evaluation $OT Eval Moderate Complexity: 1 Mod    Flemon Kelty A Keshayla Schrum 09/03/2022, 1:38 PM

## 2022-09-03 NOTE — Evaluation (Addendum)
Physical Therapy Evaluation Patient Details Name: [name] MRN: [id number] DOB: Oct 16, 1966 Today's Date: 09/03/2022  History of Present Illness  56 yo female admitted 9/6 with left knee pain and edema s/p unsuccessful aspiration grossly 2wks PTA as well as in ED. Pt with left knee cellulitis. PMHx: COPD, DM, neuropathy, hypothyroidism, colon CA, cellulits, schizophrenia  Clinical Impression  Pt admitted with above. PTA, pt lives alone, uses a w/c for mobility and is independent with ADL's. Pt reports bedroom/bathroom is not wheelchair accessible; she completes floor transfers and crawls to access. Pt reporting vocational rehab involved to modify household, however, this project will not be completed for another 5 months. Pt caregiver reporting they would like to set up living area in den with bedside commode. Pt currently requiring two person minimal assist for transfers and ambulating ~5 ft with RW. Total assist required for donning suction fit prosthetic. Will continue to progress mobility as tolerated.     Recommendations for follow up therapy are one component of a multi-disciplinary discharge planning process, led by the attending physician.  Recommendations may be updated based on patient status, additional functional criteria and insurance authorization.  Follow Up Recommendations Home health PT (pt refusing SNF) Can patient physically be transported by private vehicle: Yes    Assistance Recommended at Discharge Frequent or constant Supervision/Assistance  Patient can return home with the following  A little help with walking and/or transfers;A little help with bathing/dressing/bathroom;Assistance with cooking/housework;Help with stairs or ramp for entrance;Assist for transportation    Equipment Recommendations BSC/3in1  Recommendations for Other Services       Functional Status Assessment Patient has had a recent decline in their functional status and demonstrates the ability to  make significant improvements in function in a reasonable and predictable amount of time.     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 Braces or Orthoses: Other Brace Other Brace: L post op shoe Restrictions Weight Bearing Restrictions: No      Mobility  Bed Mobility Overal bed mobility: Needs Assistance Bed Mobility: Supine to Sit     Supine to sit: Min guard          Transfers Overall transfer level: Needs assistance Equipment used: Rolling walker (2 wheels) Transfers: Sit to/from Stand Sit to Stand: Min assist, +2 safety/equipment, From elevated surface           General transfer comment: with R prosthetic donned    Ambulation/Gait Ambulation/Gait assistance: +2 safety/equipment, Min assist Gait Distance (Feet): 5 Feet Assistive device: Rolling walker (2 wheels) Gait Pattern/deviations: Step-to pattern, Decreased stride length Gait velocity: decreased     General Gait Details: Step to pattern, cues for stepping initiation and sequencing  Stairs            Wheelchair Mobility    Modified Rankin (Stroke Patients Only)       Balance Overall balance assessment: Needs assistance, History of Falls Sitting-balance support: Feet supported Sitting balance-Leahy Scale: Good     Standing balance support: Bilateral upper extremity supported Standing balance-Leahy Scale: Poor                               Pertinent Vitals/Pain Pain Assessment Pain Assessment: Faces Faces Pain Scale: No hurt    Home Living Family/patient expects to be discharged to:: Private residence Living Arrangements: Alone   Type of Home: House Home Access: Ramped entrance  Alternate Level Stairs-Number of Steps: 1 step/ramp down to her bedroom/bathroom Home Layout: Multi-level   Additional Comments: voc rehab planning to modify the home (soon?); WC does not fit through doorways of home. Pt  completes floor transfers and crawls to access bedroom/bed/bathroom/toilet    Prior Function Prior Level of Function : Independent/Modified Independent;Needs assist             Mobility Comments: mobilizes at wc level, transfers out of wc to floor and crawls to bedroom/bathroom. Is able to transfer from floor to toilet indep ADLs Comments: overall Mod I, difficulty with above head tasks.     Hand Dominance        Extremity/Trunk Assessment   Upper Extremity Assessment Upper Extremity Assessment: Overall WFL for tasks assessed    Lower Extremity Assessment Lower Extremity Assessment: RLE deficits/detail;LLE deficits/detail RLE Deficits / Details: prior BKA. Able to perform SLR; closed wound on anterior aspect LLE Deficits / Details: Grossly 3/5    Cervical / Trunk Assessment Cervical / Trunk Assessment: Normal  Communication   Communication: No difficulties  Cognition Arousal/Alertness: Awake/alert Behavior During Therapy: Flat affect Overall Cognitive Status: Within Functional Limits for tasks assessed                                 General Comments: pt holding eyes closed the majority of the session but overall followign commands and answering questions appropriately.        General Comments General comments (skin integrity, edema, etc.): VSS on RA, sister present and supportive    Exercises     Assessment/Plan    PT Assessment Patient needs continued PT services  PT Problem List Decreased strength;Decreased activity tolerance;Decreased balance;Decreased mobility       PT Treatment Interventions DME instruction;Gait training;Functional mobility training;Therapeutic activities;Therapeutic exercise;Balance training;Patient/family education    PT Goals (Current goals can be found in the Care Plan section)  Acute Rehab PT Goals Patient Stated Goal: to go home PT Goal Formulation: With patient Time For Goal Achievement: 09/17/22 Potential to  Achieve Goals: Good    Frequency Min 3X/week     Co-evaluation PT/OT/SLP Co-Evaluation/Treatment: Yes Reason for Co-Treatment: For patient/therapist safety;To address functional/ADL transfers;Complexity of the patient's impairments (multi-system involvement) PT goals addressed during session: Mobility/safety with mobility OT goals addressed during session: ADL's and self-care       AM-PAC PT "6 Clicks" Mobility  Outcome Measure Help needed turning from your back to your side while in a flat bed without using bedrails?: A Little Help needed moving from lying on your back to sitting on the side of a flat bed without using bedrails?: A Little Help needed moving to and from a bed to a chair (including a wheelchair)?: A Little Help needed standing up from a chair using your arms (e.g., wheelchair or bedside chair)?: A Little Help needed to walk in hospital room?: A Little Help needed climbing 3-5 steps with a railing? : A Lot 6 Click Score: 17    End of Session Equipment Utilized During Treatment: Gait belt Activity Tolerance: Patient tolerated treatment well Patient left: in chair;with call bell/phone within reach;with chair alarm set Nurse Communication: Mobility status PT Visit Diagnosis: Unsteadiness on feet (R26.81);Other abnormalities of gait and mobility (R26.89);History of falling (Z91.81)    Time: 1191-4782 PT Time Calculation (min) (ACUTE ONLY): 32 min   Charges:   PT Evaluation $PT Eval Moderate Complexity: 1 Mod  Wyona Almas, PT, DPT Acute Rehabilitation Services Office [place] 09/03/2022, 1:45 PM

## 2022-09-04 DIAGNOSIS — L03116 Cellulitis of left lower limb: Secondary | ICD-10-CM | POA: Diagnosis not present

## 2022-09-04 LAB — BASIC METABOLIC PANEL
Anion gap: 9 (ref 5–15)
BUN: 10 mg/dL (ref 6–20)
CO2: 24 mmol/L (ref 22–32)
Calcium: 8.2 mg/dL — ABNORMAL LOW (ref 8.9–10.3)
Chloride: 98 mmol/L (ref 98–111)
Creatinine, Ser: 1.05 mg/dL — ABNORMAL HIGH (ref 0.44–1.00)
GFR, Estimated: >60 mL/min (ref >60)
Glucose, Bld: 176 mg/dL — ABNORMAL HIGH (ref 70–99)
Potassium: 4.2 mmol/L (ref 3.5–5.1)
Sodium: 131 mmol/L — ABNORMAL LOW (ref 135–145)

## 2022-09-04 LAB — CBC
HCT: 24 % — ABNORMAL LOW (ref 36.0–46.0)
Hemoglobin: 8.3 g/dL — ABNORMAL LOW (ref 12.0–15.0)
MCH: 30.3 pg (ref 26.0–34.0)
MCHC: 34.6 g/dL (ref 30.0–36.0)
MCV: 87.6 fL (ref 80.0–100.0)
Platelets: 322 10*3/uL (ref 150–400)
RBC: 2.74 MIL/uL — ABNORMAL LOW (ref 3.87–5.11)
RDW: 12.5 % (ref 11.5–15.5)
WBC: 11 10*3/uL — ABNORMAL HIGH (ref 4.0–10.5)
nRBC: 0 % (ref 0.0–0.2)

## 2022-09-04 LAB — MAGNESIUM: Magnesium: 1.3 mg/dL — ABNORMAL LOW (ref 1.7–2.4)

## 2022-09-04 LAB — GLUCOSE, CAPILLARY
Glucose-Capillary: 128 mg/dL — ABNORMAL HIGH (ref 70–99)
Glucose-Capillary: 193 mg/dL — ABNORMAL HIGH (ref 70–99)
Glucose-Capillary: 173 mg/dL — ABNORMAL HIGH (ref 70–99)
Glucose-Capillary: 169 mg/dL — ABNORMAL HIGH (ref 70–99)

## 2022-09-04 MED ORDER — CARIPRAZINE HCL 3 MG PO CAPS
3.0000 mg | ORAL_CAPSULE | Freq: Every day | ORAL | Status: DC
  Filled 2022-09-04: qty 1

## 2022-09-04 MED ORDER — FAMOTIDINE 20 MG PO TABS
20.0000 mg | ORAL_TABLET | Freq: Two times a day (BID) | ORAL | Status: DC
  Administered 2022-09-04 – 2022-09-06 (×6): 20 mg via ORAL
  Filled 2022-09-04 (×6): qty 1

## 2022-09-04 MED ORDER — MAGNESIUM SULFATE 4 GM/100ML IV SOLN
4.0000 g | Freq: Once | INTRAVENOUS | Status: AC
  Administered 2022-09-04: 4 g via INTRAVENOUS
  Filled 2022-09-04: qty 100

## 2022-09-04 NOTE — Progress Notes (Signed)
Progress Note Patient: [name] OBS:[id number] DOB: 1966-05-24 DOA: 08/31/2022  DOS: the patient was seen and examined on 09/04/2022  Brief hospital course: PMH of COPD, type II DM, hypothyroidism, schizophrenia, OSA not on CPAP, colon cancer, presented to hospital with complaints of left knee swelling and pain.  Concern about septic arthritis and bursitis.  Orthopedic was consulted. On 9/7 patient is significantly drowsy and lethargic.  Holding multiple psychotropic medications.  Mentation improved with holding medications.   Psychiatry consulted for further assistance with medications.  Assessment and Plan: Cellulitis with concern for possible septic infrapatellar bursitis Patient presents with complaints of redness and swelling right knee over the last 2 months. X-rays revealed soft tissue swelling without signs of osteomyelitis.  Patient has been ordered Rocephin and vancomycin, but developed rash on Rocephin for which it was stopped.   ED provider attempted aspiration of the knee joint without success. Orthopedic consulted. Continue pain control. MRI negative for osteomyelitis.  Negative for septic arthritis. Wound culture positive for MRSA.  Switch to doxycycline only.  Continue therapy for total 10 days.   Depression and anxiety Tardive dyskinesia. Acute metabolic/toxic encephalopathy Concern for schizophrenia.-Ruled out.  No evidence. Patient is significantly drowsy and lethargic on 9/7. Suspect this is multi factorial although most likely polypharmacy related. No focal deficit. Patient is on multiple psychotropic medications. Highly appreciate psychiatry consultation. For now we will continue Seroquel at 400 mg nightly. We will decrease Vraylar to 3 mg and switch to daily from QHS due to ongoing issues with insomnia at night and and daytime sleepiness.  Continue Cymbalta 60 mg once daily. We will continue gabapentin 300 mg nightly. Continue Ingrezza for TD. Outpatient  follow-up with psychiatrist.  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 Continue Lantus and sliding scale insulin for now.   Acute kidney injury Patient presents with creatinine of 1.2.  Worsened to 1.3 due to hypotension Baseline creatinine previously noted to be 0.92 on 02/10/2022.   Treated with IV fluids.   Lactic acidosis resolved  Lactic acid elevated at 4.8-> 3.4. Treated with IV fluids.   Normocytic anemia Hemoglobin stable.  Previously was 13 currently stable around 8. No active bleeding seen.   Hyponatremia On admission sodium 121. Currently normal. Treated with IV normal saline. Most likely from poor p.o. intake.   Hypokalemia Replaced.   Hypomagnesemia  Corrected as well.  But remains low, will stop protonix as it may be rarely associated with low Mg level.   Hypocalcemia Treated with calcium gluconate. Monitor.   Nausea and vomiting Resolved for now. Monitor.   Essential hypertension Was hypotensive. Holding all blood pressure medication. Holding diuretics.   Hypothyroidism Normal TSH normal free.  Monitor. Continue Synthroid.   Chronic pain Continue oxycodone as needed for pain minimize narcotics.   PAD Continue statin   History of colon cancer Status post resection in 2016.  Patient denies any reports of bleeding.   Nicotine use Patient currently uses electronic cigarettes   GERD Continue treatment with Pepcid.    Obesity Body mass index is 34.95 kg/m.  Placing the pt at higher risk of poor outcomes.   OSA. Not on CPAP. Does not want to use CPAP. Placing the patient has a poor outcome due to her ongoing somnolence. We will check nocturnal oximetry.  Subjective: Remains drowsy.  No nausea no vomiting no fever no chills.  Oral intake adequate.  Physical Exam: Vitals:  09/03/22 2000 09/03/22 2335 09/04/22 0436 09/04/22 0918  BP:   (!) 159/66 126/60 (!) 146/67  Pulse:  94  87  Resp: 16 (!) '21 12 14  '$ Temp:  98.7 F (37.1 C) 98.3 F (36.8 C) 98.2 F (36.8 C)  TempSrc:  Oral Oral Oral  SpO2:  97% 100% 100%  Weight:      Height:       General: Appear in mild distress; no visible Abnormal Neck Mass Or lumps, Conjunctiva normal Cardiovascular: S1 and S2 Present, no Murmur, Respiratory: good respiratory effort, Bilateral Air entry present and faint Crackles, no wheezes Abdomen: Bowel Sound present, Non tender  Extremities: trace Pedal edema Neurology: lethargic and oriented to place and person  Gait not checked due to patient safety concerns   Data Reviewed: I have Reviewed nursing notes, Vitals, and Lab results since pt's last encounter. Pertinent lab results CBC and BMP I have ordered test including BMP and Mg    Family Communication: no one at bedside  Disposition: Status is: Inpatient Remains inpatient appropriate because: ongoing issues with Mg level and mentation needing med adjustment  Author: Berle Mull, MD 09/04/2022 2:48 PM  Please look on [email or web address] to find out who is on call.

## 2022-09-05 DIAGNOSIS — L03116 Cellulitis of left lower limb: Secondary | ICD-10-CM | POA: Diagnosis not present

## 2022-09-05 LAB — BASIC METABOLIC PANEL
Anion gap: 8 (ref 5–15)
BUN: 11 mg/dL (ref 6–20)
CO2: 25 mmol/L (ref 22–32)
Calcium: 8.6 mg/dL — ABNORMAL LOW (ref 8.9–10.3)
Chloride: 102 mmol/L (ref 98–111)
Creatinine, Ser: 1.13 mg/dL — ABNORMAL HIGH (ref 0.44–1.00)
GFR, Estimated: 57 mL/min — ABNORMAL LOW (ref >60)
Glucose, Bld: 174 mg/dL — ABNORMAL HIGH (ref 70–99)
Potassium: 3.7 mmol/L (ref 3.5–5.1)
Sodium: 135 mmol/L (ref 135–145)

## 2022-09-05 LAB — CULTURE, BLOOD (ROUTINE X 2)
Culture: NO GROWTH
Culture: NO GROWTH

## 2022-09-05 LAB — CBC
HCT: 24 % — ABNORMAL LOW (ref 36.0–46.0)
Hemoglobin: 8.3 g/dL — ABNORMAL LOW (ref 12.0–15.0)
MCH: 30.5 pg (ref 26.0–34.0)
MCHC: 34.6 g/dL (ref 30.0–36.0)
MCV: 88.2 fL (ref 80.0–100.0)
Platelets: 315 10*3/uL (ref 150–400)
RBC: 2.72 MIL/uL — ABNORMAL LOW (ref 3.87–5.11)
RDW: 12.6 % (ref 11.5–15.5)
WBC: 8.4 10*3/uL (ref 4.0–10.5)
nRBC: 0 % (ref 0.0–0.2)

## 2022-09-05 LAB — GLUCOSE, CAPILLARY
Glucose-Capillary: 242 mg/dL — ABNORMAL HIGH (ref 70–99)
Glucose-Capillary: 179 mg/dL — ABNORMAL HIGH (ref 70–99)
Glucose-Capillary: 277 mg/dL — ABNORMAL HIGH (ref 70–99)
Glucose-Capillary: 196 mg/dL — ABNORMAL HIGH (ref 70–99)
Glucose-Capillary: 250 mg/dL — ABNORMAL HIGH (ref 70–99)

## 2022-09-05 LAB — MAGNESIUM: Magnesium: 1.3 mg/dL — ABNORMAL LOW (ref 1.7–2.4)

## 2022-09-05 MED ORDER — SACCHAROMYCES BOULARDII 250 MG PO CAPS
250.0000 mg | ORAL_CAPSULE | Freq: Two times a day (BID) | ORAL | Status: DC
Start: 2022-09-05 — End: 2022-09-07
  Administered 2022-09-05 – 2022-09-06 (×4): 250 mg via ORAL
  Filled 2022-09-05 (×4): qty 1

## 2022-09-05 MED ORDER — MAGNESIUM SULFATE 4 GM/100ML IV SOLN
4.0000 g | Freq: Once | INTRAVENOUS | Status: AC
Start: 2022-09-05 — End: 2022-09-05
  Administered 2022-09-05: 4 g via INTRAVENOUS
  Filled 2022-09-05: qty 100

## 2022-09-05 MED ORDER — MAGNESIUM OXIDE -MG SUPPLEMENT 400 (240 MG) MG PO TABS
800.0000 mg | ORAL_TABLET | Freq: Two times a day (BID) | ORAL | Status: DC
Start: 2022-09-05 — End: 2022-09-07
  Administered 2022-09-05 – 2022-09-06 (×4): 800 mg via ORAL
  Filled 2022-09-05 (×4): qty 2

## 2022-09-05 MED ORDER — CARIPRAZINE HCL 1.5 MG PO CAPS
3.0000 mg | ORAL_CAPSULE | Freq: Every day | ORAL | Status: DC
  Administered 2022-09-05 – 2022-09-06 (×2): 3 mg via ORAL
  Filled 2022-09-05 (×2): qty 2

## 2022-09-05 MED ORDER — LOPERAMIDE HCL 2 MG PO CAPS: 2.0000 mg | ORAL_CAPSULE | ORAL | Status: DC | PRN

## 2022-09-05 NOTE — Progress Notes (Signed)
Physical Therapy Treatment Patient Details Name: [name] MRN: [id number] DOB: [birth year] Today's Date: 09/05/2022   History of Present Illness 56 yo female admitted 9/6 with left knee pain and edema s/p unsuccessful aspiration grossly 2wks PTA as well as in ED. Pt with left knee cellulitis. PMHx: COPD, DM, neuropathy, hypothyroidism, colon CA, cellulits, schizophrenia    PT Comments    Pt progressing well towards her physical therapy goals; appears more alert this session and is participatory. Pt ambulating 50 ft x 2 with a walker and close chair follow; R prosthetic and L post op shoe donned. Requiring minimal assist overall for functional mobility. Presents as a high fall risk based on decreased gait speed, impaired standing balance and decreased safety awareness. Pt now agreeable to SNF which is appropriate in light of deficits, decreased caregiver support, and inaccessible housing.   Recommendations for follow up therapy are one component of a multi-disciplinary discharge planning process, led by the attending physician.  Recommendations may be updated based on patient status, additional functional criteria and insurance authorization.  Follow Up Recommendations  Skilled nursing-short term rehab (<3 hours/day) Can patient physically be transported by private vehicle: Yes   Assistance Recommended at Discharge Frequent or constant Supervision/Assistance  Patient can return home with the following A little help with walking and/or transfers;A little help with bathing/dressing/bathroom;Assistance with cooking/housework;Help with stairs or ramp for entrance;Assist for transportation   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 Braces or Orthoses: Other Brace Other Brace: L post op shoe Restrictions Weight Bearing Restrictions: No      Mobility  Bed Mobility               General bed mobility comments: OOB in chair    Transfers Overall transfer level: Needs assistance Equipment used:  (3 wheeled walker) Transfers: Sit to/from Stand Sit to Stand: Min assist           General transfer comment: Pt using momentum and minA to power up    Ambulation/Gait Ambulation/Gait assistance: Min assist, +2 safety/equipment Gait Distance (Feet): 50 Feet (50", 50") Assistive device:  (3 wheeled walker) Gait Pattern/deviations: Step-to pattern, Decreased stride length Gait velocity: decreased Gait velocity interpretation: <1.8 ft/sec, indicate of risk for recurrent falls   General Gait Details: Cues for upright posture, chair follow utilized, light minA for balance   Stairs             Wheelchair Mobility    Modified Rankin (Stroke Patients Only)       Balance Overall balance assessment: Needs assistance, History of Falls Sitting-balance support: Feet supported Sitting balance-Leahy Scale: Good     Standing balance support: Bilateral upper extremity supported Standing balance-Leahy Scale: Poor                              Cognition Arousal/Alertness: Awake/alert Behavior During Therapy: Flat affect Overall Cognitive Status: Within Functional Limits for tasks assessed                                 General Comments: continues with flat affect but overall cog WFL for all funcitonal tasks        Exercises      General Comments  Pertinent Vitals/Pain Pain Assessment Pain Assessment: Faces Faces Pain Scale: No hurt    Home Living                          Prior Function            PT Goals (current goals can now be found in the care plan section) Acute Rehab PT Goals Potential to Achieve Goals: Good Progress towards PT goals: Progressing toward goals    Frequency    Min 3X/week      PT Plan Discharge plan needs to be  updated    Co-evaluation              AM-PAC PT "6 Clicks" Mobility   Outcome Measure  Help needed turning from your back to your side while in a flat bed without using bedrails?: A Little Help needed moving from lying on your back to sitting on the side of a flat bed without using bedrails?: A Little Help needed moving to and from a bed to a chair (including a wheelchair)?: A Little Help needed standing up from a chair using your arms (e.g., wheelchair or bedside chair)?: A Little Help needed to walk in hospital room?: A Little Help needed climbing 3-5 steps with a railing? : A Lot 6 Click Score: 17    End of Session Equipment Utilized During Treatment: Gait belt Activity Tolerance: Patient tolerated treatment well Patient left: in chair;with call bell/phone within reach;with chair alarm set Nurse Communication: Mobility status PT Visit Diagnosis: Unsteadiness on feet (R26.81);Other abnormalities of gait and mobility (R26.89);History of falling (Z91.81)     Time: 5859-2924 PT Time Calculation (min) (ACUTE ONLY): 32 min  Charges:  $Gait Training: 8-22 mins $Therapeutic Activity: 8-22 mins                     [name], PT, DPT Acute Rehabilitation Services Office [phone]    Deno Etienne 09/05/2022, 4:34 PM

## 2022-09-05 NOTE — Progress Notes (Signed)
Pt placed on overnight pulse ox at this time.

## 2022-09-05 NOTE — Progress Notes (Signed)
Occupational Therapy Treatment Patient Details Name: [name] MRN: [id number] DOB: 1966/01/23 Today's Date: 09/05/2022   History of present illness 56 yo female admitted 9/6 with left knee pain and edema s/p unsuccessful aspiration grossly 2wks PTA as well as in ED. Pt with left knee cellulitis. PMHx: COPD, DM, neuropathy, hypothyroidism, colon CA, cellulits, schizophrenia   OT comments  [name] is making steady progress. She required min A for transfers and functional mobility within the room with prosthetic donned. She remains limited by her ability to don the prosthetic indep, needed max/total A for the sleeve and positioning of the prosthetic. Pt now agreeable to post acute STSNF for rehab prior to going home; discussed importance of being RW level with prosthetic, instead of crawling in the home. Pt continues to benefit. Recommend d/c to SNF for continued therapy.    Recommendations for follow up therapy are one component of a multi-disciplinary discharge planning process, led by the attending physician.  Recommendations may be updated based on patient status, additional functional criteria and insurance authorization.    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 Braces or Orthoses: Other Brace Other Brace: L post op shoe Restrictions Weight Bearing Restrictions: No       Mobility Bed Mobility Overal bed mobility: Needs Assistance Bed Mobility: Supine to Sit     Supine to sit:  Supervision          Transfers Overall transfer level: Needs assistance Equipment used: Rolling walker (2 wheels) Transfers: Sit to/from Stand Sit to Stand: Min assist           General transfer comment: with R prosthetic adn L post op shoe donned     Balance Overall balance assessment: Needs assistance, History of Falls Sitting-balance support: Feet supported Sitting balance-Leahy Scale: Good     Standing balance support: Bilateral upper extremity supported Standing balance-Leahy Scale: Poor                             ADL either performed or assessed with clinical judgement   ADL Overall ADL's : Needs assistance/impaired                     Lower Body Dressing: Maximal assistance;Sit to/from stand Lower Body Dressing Details (indicate cue type and reason): total A for R prosthetic Toilet Transfer: Minimal assistance;Rolling walker (2 wheels) Toilet Transfer Details (indicate cue type and reason): simulated         Functional mobility during ADLs: Minimal assistance;Rolling walker (2 wheels) General ADL Comments: Continues to required extensive assist to don prostetic, however once standing pt does wel lwtih short distance functional ambulation with RW    Extremity/Trunk Assessment Upper Extremity Assessment Upper Extremity Assessment: Overall WFL for tasks assessed (tremor noted in LUE, pt states it is baseline)   Lower Extremity Assessment Lower Extremity Assessment: Defer to PT evaluation        Vision   Vision Assessment?: No apparent visual deficits   Perception Perception Perception: Not tested   Praxis Praxis Praxis: Not tested  Cognition Arousal/Alertness: Awake/alert Behavior During Therapy: Flat affect Overall Cognitive Status: Within Functional Limits for tasks assessed               General Comments: contines with flat affects but overall cog WFL for all funcitonal tasks and conversational topics had               General Comments VSS on RA, pt with complaints of being "hot" - temp was 98.2    Pertinent Vitals/ Pain       Pain Assessment Pain Assessment: Faces Faces Pain Scale: No hurt Pain Intervention(s): Monitored during session   Frequency  Min 2X/week        Progress Toward Goals  OT Goals(current goals can now be found in the care plan section)  Progress towards OT goals: Progressing toward goals  Acute Rehab OT Goals Patient Stated Goal: to rehab OT Goal Formulation: With patient Time For Goal Achievement: 09/17/22 Potential to Achieve Goals: Good ADL Goals Pt Will Perform Lower Body Bathing: with modified independence;sit to/from stand Pt Will Perform Lower Body Dressing: with modified independence;sit to/from stand Pt Will Transfer to Toilet: with supervision;ambulating  Plan Discharge plan needs to be updated       AM-PAC OT "6 Clicks" Daily Activity     Outcome Measure   Help from another person eating meals?: None Help from another person taking care of personal grooming?: A Little Help from another person toileting, which includes using toliet, bedpan, or urinal?: A Little Help from another person bathing (including washing, rinsing, drying)?: A Little Help from another person to put on and taking off regular upper body clothing?: None Help from another person to put on and taking off regular lower body clothing?: A Little 6 Click Score: 20    End of Session Equipment Utilized During Treatment: Gait belt;Rolling walker (2 wheels)  OT Visit Diagnosis: Unsteadiness on feet (R26.81);Other abnormalities of gait and mobility (R26.89);Muscle weakness (generalized) (M62.81);History of falling (Z91.81)   Activity Tolerance Patient tolerated treatment well   Patient Left in chair;with call bell/phone within reach;with family/visitor present;with chair alarm set   Nurse Communication Mobility status        Time: 6644-0347 OT Time Calculation (min): 25  min  Charges: OT General Charges $OT Visit: 1 Visit OT Treatments $Therapeutic Activity: 8-22 mins    Elliot Cousin 09/05/2022, 1:04 PM

## 2022-09-05 NOTE — Progress Notes (Signed)
Progress Note Patient: [name] UXL:[id number] DOB: 11/03/1966 DOA: 08/31/2022  DOS: the patient was seen and examined on 09/05/2022  Brief hospital course: PMH of COPD, type II DM, hypothyroidism, schizophrenia, OSA not on CPAP, colon cancer, presented to hospital with complaints of left knee swelling and pain.  Concern about septic arthritis and bursitis.  Orthopedic was consulted. On 9/7 patient is significantly drowsy and lethargic.  Holding multiple psychotropic medications.  Mentation improved with holding medications.   Psychiatry consulted for further assistance with medications. Now medically stable.  Plan to discharge to SNF when beds available.  Assessment and Plan: Cellulitis with concern for possible septic infrapatellar bursitis Patient presents with complaints of redness and swelling right knee over the last 2 months. X-rays revealed soft tissue swelling without signs of osteomyelitis.  Patient has been ordered Rocephin and vancomycin, but developed rash on Rocephin for which it was stopped.   ED provider attempted aspiration of the knee joint without success. Orthopedic consulted. Continue pain control. MRI negative for osteomyelitis.  Negative for septic arthritis. Wound culture positive for MRSA.  Switch to doxycycline only.  Continue therapy for total 10 days.  Discussed with orthopedic on 9/11.  Agree with the plan.   Depression and anxiety Tardive dyskinesia. Acute metabolic/toxic encephalopathy Concern for schizophrenia.-Ruled out.  No evidence. Patient is significantly drowsy and lethargic on 9/7. Suspect this is multi factorial although most likely polypharmacy related. No focal deficit. Patient is on multiple psychotropic medications. Highly appreciate psychiatry consultation. For now we will continue Seroquel at 400 mg nightly. We will decrease Vraylar to 3 mg and switch to daily from QHS due to ongoing issues with insomnia at night and and daytime  sleepiness.  Continue Cymbalta 60 mg once daily. We will continue gabapentin 300 mg nightly. Continue Ingrezza for TD. Outpatient follow-up with psychiatrist.  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 Continue Lantus and sliding scale insulin for now.   Acute kidney injury Patient presents with creatinine of 1.2.  Worsened to 1.3 due to hypotension Baseline creatinine previously noted to be 0.92 on 02/10/2022.   Treated with IV fluids.   Lactic acidosis resolved  Lactic acid elevated at 4.8-> 3.4. Treated with IV fluids.   Normocytic anemia Hemoglobin stable.  Previously was 13 currently stable around 8. No active bleeding seen.   Hyponatremia On admission sodium 121. Currently normal. Treated with IV normal saline. Most likely from poor p.o. intake.   Hypokalemia Replaced.   Hypomagnesemia  Corrected as well.  But remains low, will stop protonix as it may be rarely associated with low Mg level. We will initiate oral supplementation as well.   Hypocalcemia Treated with calcium gluconate. Monitor.   Nausea and vomiting Resolved for now. Monitor.   Essential hypertension Was hypotensive. Holding all blood pressure medication. Holding diuretics.   Hypothyroidism Normal TSH normal free.  Monitor. Continue Synthroid.   Chronic pain Continue oxycodone as needed for pain minimize narcotics.   PAD Continue statin   History of colon cancer Status post resection in 2016.  Patient denies any reports of bleeding.   Nicotine use Patient currently uses electronic cigarettes   GERD Continue treatment with Pepcid.    Obesity Body mass index is 34.95 kg/m.  Placing the pt at higher risk of poor outcomes.   OSA. Not on CPAP. Does not want to use CPAP. Placing the patient has a poor outcome due to her  ongoing somnolence. We will check nocturnal  oximetry.  Subjective: Mentation better.  Has more tremor.  No nausea no vomiting.  Physical Exam: Vitals:   09/05/22 0335 09/05/22 0826 09/05/22 1240 09/05/22 1606  BP: 127/65 (!) 153/65  (!) 176/86  Pulse: 77 86 94 92  Resp: 11 15    Temp: 98.7 F (37.1 C) 98.2 F (36.8 C) 97.7 F (36.5 C) 98 F (36.7 C)  TempSrc: Oral Oral Oral Oral  SpO2: 99% 99% 99% 99%  Weight:      Height:       General: Appear in mild distress; no visible Abnormal Neck Mass Or lumps, Conjunctiva normal Cardiovascular: S1 and S2 Present, no Murmur, Respiratory: good respiratory effort, Bilateral Air entry present and CTA, no Crackles, no wheezes Abdomen: Bowel Sound present, Non tender Extremities: no Pedal edema, right BKA Neurology: alert and oriented to Self, Place and time.   Data Reviewed: I have Reviewed nursing notes, Vitals, and Lab results since pt's last encounter. Pertinent lab results CBC and BMP I have ordered test including BMP and Mg    Family Communication: no one at bedside  Disposition: Status is: Inpatient Remains inpatient appropriate because: Replacing magnesium, requiring nocturnal oximetry as well as placement.  Author: Berle Mull, MD 09/05/2022 7:27 PM  Please look on [email or web address] to find out who is on call.

## 2022-09-05 NOTE — NC FL2 (Signed)
[place] LEVEL OF CARE SCREENING TOOL     IDENTIFICATION  Patient Name: [name] Birthdate: 1966-10-24 Sex: female Admission Date (Current Location): 08/31/2022  [facility] and [place] Number:  Herbalist and Address:  [facility] [place], [place] [address] [place], [place] [address]      Provider Number: [id number]  Attending Physician Name and Address:  Lavina Hamman, MD  Relative Name and Phone Number:       Current Level of Care: Hospital Recommended Level of Care: [place] Prior Approval Number:    Date Approved/Denied:   PASRR Number: Pending  Discharge Plan: SNF    Current Diagnoses: Patient Active Problem List   Diagnosis Date Noted   Septic arthritis ([place]) 09/01/2022   Septic infrapatellar bursitis 08/31/2022   Cellulitis of left knee 08/31/2022   Lactic acidosis 08/31/2022   Normocytic anemia 08/31/2022   Nausea & vomiting 08/31/2022   Hypocalcemia 08/31/2022   Renal insufficiency 08/31/2022   Diarrhea 01/03/2022   Hypokalemia 01/03/2022   Palpitations 01/29/2021   Nicotine dependence 03/06/2020   Genetic testing 03/12/2019   Family history of colon cancer    Family history of bladder cancer    Family history of kidney cancer    Postmenopausal bleeding 11/15/2018   Dog bite of foot, sequela    Left foot infection 10/25/2018   Gangrenous disorder ([place]) 09/17/2018   Cellulitis of left upper extremity    Poorly controlled diabetes mellitus ([place])    UTI (urinary tract infection) 71/05/2693   Acute metabolic encephalopathy 85/46/2703   GERD (gastroesophageal reflux disease) 05/09/2018   Tobacco abuse 05/09/2018   Polypharmacy 05/09/2018   Restless leg syndrome 04/27/2018   Anxiety and depression 04/27/2018   Hyponatremia 04/26/2018   Noncompliance with dietary restriction 04/26/2018   Postoperative cellulitis of surgical wound 04/26/2018   At high risk for falls 04/24/2018   Noncompliance  with safety precautions 04/24/2018   Leukocytosis 04/24/2018   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   PAD (peripheral artery disease) ([place])    Poorly controlled type 2 diabetes mellitus ([place])    Subacute osteomyelitis, right ankle and foot ([place])    Major depressive disorder, recurrent episode ([place]) 04/11/2018   Diabetic ulcer of right midfoot associated with type 2 diabetes mellitus, with necrosis of bone ([place])    Abscess of ankle    Infectious synovitis    Diabetic foot infection ([place])    Sepsis ([place]) 04/09/2018   Melanocytic nevus 02/28/2018   Lesion of labia 09/27/2017   Vaginitis and vulvovaginitis 02/19/2016   Cancer of sigmoid colon ([place]) 03/09/2015   History of stroke within last year 02/12/2015   OSA (obstructive sleep apnea) 07/04/2014   Migraine without aura, with intractable migraine, so stated, without mention of status migrainosus 03/26/2014   Peripheral neuropathy 03/03/2014   Abnormal stress test 02/12/2014   CVA (cerebral infarction) 02/12/2014   Chest pain    Abnormal heart rhythm    Edema    Hypertension    Hyperlipemia    Hypercholesterolemia    Uncontrolled type 2 diabetes mellitus with hyperglycemia, with long-term current use of insulin ([place]) 07/22/2013   Diabetic neuropathy with neurologic complication ([place]) 50/08/3817   Diabetic polyneuropathy ([place]) 07/22/2013    Orientation RESPIRATION BLADDER Height & Weight     Self, Time, Situation, Place  Normal Incontinent, External catheter Weight: 210 lb (95.3 kg) Height:  '5\' 5"'$  (165.1 cm)  BEHAVIORAL SYMPTOMS/MOOD NEUROLOGICAL BOWEL NUTRITION STATUS  Incontinent Diet (See DC summary)  AMBULATORY STATUS COMMUNICATION OF NEEDS Skin   Limited Assist Verbally Surgical wounds (L leg incision)                       Personal Care Assistance Level of Assistance  Bathing, Feeding, Dressing Bathing Assistance: Limited assistance Feeding assistance: Independent Dressing Assistance: Limited  assistance     Functional Limitations Info  Sight, Hearing, Speech Sight Info: Adequate Hearing Info: Adequate Speech Info: Adequate    SPECIAL CARE FACTORS FREQUENCY  PT (By licensed PT), OT (By licensed OT)     PT Frequency: 5x week OT Frequency: 5x week            Contractures Contractures Info: Not present    Additional Factors Info  Code Status, Allergies, Psychotropic, Insulin Sliding Scale Code Status Info: Full Allergies Info: Latex   Sweetening Enhancer (Flavoring Agent)   Amoxicillin   Ropinirole   Aspartame And Phenylalanine   Ceftriaxone   Ibuprofen   Trazodone And Nefazodone Psychotropic Info: Duloxetine, Quetiapine Insulin Sliding Scale Info: See DC summary       Current Medications (09/05/2022):  This is the current hospital active medication list Current Facility-Administered Medications  Medication Dose Route Frequency Provider Last Rate Last Admin   acetaminophen (TYLENOL) tablet 650 mg  650 mg Oral Q6H PRN Fuller Plan A, MD   650 mg at 09/03/22 1814   Or   acetaminophen (TYLENOL) suppository 650 mg  650 mg Rectal Q6H PRN Fuller Plan A, MD       albuterol (PROVENTIL) (2.5 MG/3ML) 0.083% nebulizer solution 2.5 mg  2.5 mg Nebulization Q6H PRN Tamala Julian, Rondell A, MD       cariprazine (VRAYLAR) capsule 3 mg  3 mg Oral Daily Einar Grad, RPH   3 mg at 09/05/22 1024   Chlorhexidine Gluconate Cloth 2 % PADS 6 each  6 each Topical Q0600 Lavina Hamman, MD   6 each at 09/04/22 1000   doxycycline (VIBRA-TABS) tablet 100 mg  100 mg Oral Q12H Lavina Hamman, MD   100 mg at 09/05/22 1024   DULoxetine (CYMBALTA) DR capsule 60 mg  60 mg Oral Daily Lavina Hamman, MD   60 mg at 09/05/22 1024   famotidine (PEPCID) tablet 20 mg  20 mg Oral BID Lavina Hamman, MD   20 mg at 09/05/22 1024   gabapentin (NEURONTIN) capsule 300 mg  300 mg Oral QHS Lavina Hamman, MD   300 mg at 09/04/22 2009   insulin aspart (novoLOG) injection 0-15 Units  0-15 Units Subcutaneous  TID WC Lavina Hamman, MD   3 Units at 09/05/22 1325   insulin aspart (novoLOG) injection 0-5 Units  0-5 Units Subcutaneous QHS Lavina Hamman, MD       insulin aspart (novoLOG) injection 6 Units  6 Units Subcutaneous TID WC Lavina Hamman, MD   6 Units at 09/05/22 1326   insulin glargine-yfgn (SEMGLEE) injection 10 Units  10 Units Subcutaneous QHS Lavina Hamman, MD   10 Units at 09/04/22 2114   levothyroxine (SYNTHROID) tablet 25 mcg  25 mcg Oral Q0600 Lavina Hamman, MD   25 mcg at 09/05/22 0556   loperamide (IMODIUM) capsule 2 mg  2 mg Oral PRN Lavina Hamman, MD       magnesium oxide (MAG-OX) tablet 800 mg  800 mg Oral BID Lavina Hamman, MD   800 mg at 09/05/22 1024  mupirocin ointment (BACTROBAN) 2 % 1 Application  1 Application Nasal BID Lavina Hamman, MD   1 Application at 16/10/96 1031   ondansetron (ZOFRAN) tablet 4 mg  4 mg Oral Q6H PRN Norval Morton, MD       Or   ondansetron (ZOFRAN) injection 4 mg  4 mg Intravenous Q6H PRN Fuller Plan A, MD       oxyCODONE-acetaminophen (PERCOCET/ROXICET) 5-325 MG per tablet 1 tablet  1 tablet Oral Q4H PRN Fuller Plan A, MD   1 tablet at 09/05/22 1028   And   oxyCODONE (Oxy IR/ROXICODONE) immediate release tablet 5 mg  5 mg Oral Q4H PRN Fuller Plan A, MD   5 mg at 09/03/22 0539   QUEtiapine (SEROQUEL XR) 24 hr tablet 400 mg  400 mg Oral QHS Lavina Hamman, MD   400 mg at 09/04/22 2010   saccharomyces boulardii (FLORASTOR) capsule 250 mg  250 mg Oral BID Lavina Hamman, MD   250 mg at 09/05/22 1208   sodium chloride flush (NS) 0.9 % injection 3 mL  3 mL Intravenous Q12H Cecilia Vancleve, Rondell A, MD   3 mL at 09/05/22 1031   valbenazine (INGREZZA) capsule 80 mg  80 mg Oral QHS Lavina Hamman, MD   80 mg at 09/04/22 2010     Discharge Medications: Please see discharge summary for a list of discharge medications.  Relevant Imaging Results:  Relevant Lab Results:   Additional Information SS#[id number] [place],  [place]

## 2022-09-05 NOTE — Social Work (Signed)
Please be advised that the above-named patient will require a short-term nursing home stay-anticipated 30 days or less for rehabilitation and strengthening. The plan is for return home.  

## 2022-09-05 NOTE — TOC Initial Note (Signed)
Transition of Care [facility]) - Initial/Assessment Note    Patient Details  Name: [name] MRN: [id number] Date of Birth: [birth year]  [facility]) CM/SW Contact:    Coralee Pesa, [place] Phone Number: 09/05/2022, 3:40 PM  Clinical Narrative:                 CSW met with pt at bedside and confirmed she is now interested in SNF. Pt does not have a preference, would like to remain in [place]. Had no further questions, agreeable to fax out. TOC will continue to follow for DC needs.  Expected Discharge Plan: Skilled Nursing Facility Barriers to Discharge: Continued Medical Work up, SNF Pending bed offer, Unsafe home situation, Insurance Authorization   Patient Goals and CMS Choice Patient states their goals for this hospitalization and ongoing recovery are:: Pt would like to be able to go to rehab. CMS Medicare.gov Compare Post Acute Care list provided to:: Patient Choice offered to / list presented to : Patient  Expected Discharge Plan and Services Expected Discharge Plan: [place] Acute Care Choice: [place] arrangements for the past 2 months: Single Family Home                                      Prior Living Arrangements/Services Living arrangements for the past 2 months: Single Family Home Lives with:: Relatives Patient language and need for interpreter reviewed:: Yes Do you feel safe going back to the place where you live?: Yes      Need for Family Participation in Patient Care: Yes (Comment) Care giver support system in place?: Yes (comment)   Criminal Activity/Legal Involvement Pertinent to Current Situation/Hospitalization: No - Comment as needed  Activities of Daily Living      Permission Sought/Granted Permission sought to share information with : Family Supports Permission granted to share information with : Yes, Verbal Permission Granted  Share Information with NAME: Millie      Permission granted to share info w Relationship: Friend  Permission granted to share info w Contact Information: [phone]  Emotional Assessment Appearance:: Appears stated age Attitude/Demeanor/Rapport: Guarded Affect (typically observed): Flat Orientation: : Oriented to Self, Oriented to Place, Oriented to  Time, Oriented to Situation Alcohol / Substance Use: Not Applicable Psych Involvement: Yes (comment)  Admission diagnosis:  Septic arthritis ([place]) [M00.9] Septic infrapatellar bursitis [M71.169] Cellulitis of left lower extremity [L03.116] Septic infrapatellar bursitis of left knee [U23.536, B96.89] Patient Active Problem List   Diagnosis Date Noted   Septic arthritis ([place]) 09/01/2022   Septic infrapatellar bursitis 08/31/2022   Cellulitis of left knee 08/31/2022   Lactic acidosis 08/31/2022   Normocytic anemia 08/31/2022   Nausea & vomiting 08/31/2022   Hypocalcemia 08/31/2022   Renal insufficiency 08/31/2022   Diarrhea 01/03/2022   Hypokalemia 01/03/2022   Palpitations 01/29/2021   Nicotine dependence 03/06/2020   Genetic testing 03/12/2019   Family history of colon cancer    Family history of bladder cancer    Family history of kidney cancer    Postmenopausal bleeding 11/15/2018   Dog bite of foot, sequela    Left foot infection 10/25/2018   Gangrenous disorder ([place]) 09/17/2018   Cellulitis of left upper extremity    Poorly controlled diabetes mellitus ([place])    UTI (urinary tract infection) 14/43/1540   Acute metabolic encephalopathy 08/67/6195   GERD (gastroesophageal reflux disease)  05/09/2018   Tobacco abuse 05/09/2018   Polypharmacy 05/09/2018   Restless leg syndrome 04/27/2018   Anxiety and depression 04/27/2018   Hyponatremia 04/26/2018   Noncompliance with dietary restriction 04/26/2018   Postoperative cellulitis of surgical wound 04/26/2018   At high risk for falls 04/24/2018   Noncompliance with safety precautions 04/24/2018   Leukocytosis  04/24/2018   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)    Poorly controlled type 2 diabetes mellitus ([place])    Subacute osteomyelitis, right ankle and foot ([place])    Major depressive disorder, recurrent episode ([place]) 04/11/2018   Diabetic ulcer of right midfoot associated with type 2 diabetes mellitus, with necrosis of bone ([place])    Abscess of ankle    Infectious synovitis    Diabetic foot infection ([place])    Sepsis ([place]) 04/09/2018   Melanocytic nevus 02/28/2018   Lesion of labia 09/27/2017   Vaginitis and vulvovaginitis 02/19/2016   Cancer of sigmoid colon ([place]) 03/09/2015   History of stroke within last year 02/12/2015   OSA (obstructive sleep apnea) 07/04/2014   Migraine without aura, with intractable migraine, so stated, without mention of status migrainosus 03/26/2014   Peripheral neuropathy 03/03/2014   Abnormal stress test 02/12/2014   CVA (cerebral infarction) 02/12/2014   Chest pain    Abnormal heart rhythm    Edema    Hypertension    Hyperlipemia    Hypercholesterolemia    Uncontrolled type 2 diabetes mellitus with hyperglycemia, with long-term current use of insulin ([place]) 07/22/2013   Diabetic neuropathy with neurologic complication ([place]) 55/97/4163   Diabetic polyneuropathy ([place]) 07/22/2013   PCP:  Emelia Loron, NP Pharmacy:   [id number] [place] [id number]: [phone]:: [phone]    Social Determinants of Health (SDOH) Interventions    Readmission Risk Interventions     No data to display

## 2022-09-05 NOTE — Progress Notes (Signed)
Speech Language Pathology Treatment: Dysphagia  Patient Details Name: [name] MRN: [id number] DOB: 03/02/1966 Today's Date: 09/05/2022 Time: 6316-7773 SLP Time Calculation (min) (ACUTE ONLY): 13 min  Assessment / Plan / Recommendation Clinical Impression  Pt was seen for dysphagia treatment with his sister present. Pt was cooperative throughout the session. Pt, RN and her sister reported that the pt has been consuming the current diet without overt s/s of aspiration. Pt denied any any difficulty with mastication of advanced solids including meats. Pt tolerated regular texture solids, and thin liquids via straw using individual and consecutive swallows without symptoms of oropharyngeal dysphagia. It is recommended that the current diet be continued. Further skilled SLP services are not clinically indicated at this time.    HPI HPI: Pt is a 56 y.o. female who presented with c/o swelling and pain of her right knee.  Pt reported symptoms started approximately 2 months PTA with swelling and pain of her left knee. 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      SLP Plan  All goals met;Discharge SLP treatment due to (comment)      Recommendations for follow up therapy are one component of a multi-disciplinary discharge planning process, led by the attending physician.  Recommendations may be updated based on patient status, additional functional criteria and insurance authorization.    Recommendations  Diet recommendations: Regular;Thin liquid Liquids provided via: Cup;Straw Medication Administration: Whole meds with liquid Supervision: Patient able to self feed Postural Changes and/or Swallow Maneuvers: Seated upright 90 degrees                Oral Care Recommendations: Oral care BID Follow Up Recommendations: No SLP follow up SLP Visit Diagnosis: Dysphagia, unspecified  (R13.10) Plan: All goals met;Discharge SLP treatment due to (comment)         [name], [place], [place] Office number [phone]   [name]  09/05/2022, 9:06 AM

## 2022-09-06 ENCOUNTER — Ambulatory Visit: Payer: 59 | Admitting: Endocrinology

## 2022-09-06 LAB — GLUCOSE, CAPILLARY
Glucose-Capillary: 152 mg/dL — ABNORMAL HIGH (ref 70–99)
Glucose-Capillary: 188 mg/dL — ABNORMAL HIGH (ref 70–99)
Glucose-Capillary: 168 mg/dL — ABNORMAL HIGH (ref 70–99)
Glucose-Capillary: 142 mg/dL — ABNORMAL HIGH (ref 70–99)

## 2022-09-06 LAB — CBC
HCT: 27.4 % — ABNORMAL LOW (ref 36.0–46.0)
Hemoglobin: 9.1 g/dL — ABNORMAL LOW (ref 12.0–15.0)
MCH: 29.9 pg (ref 26.0–34.0)
MCHC: 33.2 g/dL (ref 30.0–36.0)
MCV: 90.1 fL (ref 80.0–100.0)
Platelets: 361 10*3/uL (ref 150–400)
RBC: 3.04 MIL/uL — ABNORMAL LOW (ref 3.87–5.11)
RDW: 12.4 % (ref 11.5–15.5)
WBC: 8.5 10*3/uL (ref 4.0–10.5)
nRBC: 0 % (ref 0.0–0.2)

## 2022-09-06 LAB — BASIC METABOLIC PANEL
Anion gap: 9 (ref 5–15)
BUN: 11 mg/dL (ref 6–20)
CO2: 26 mmol/L (ref 22–32)
Calcium: 8.9 mg/dL (ref 8.9–10.3)
Chloride: 102 mmol/L (ref 98–111)
Creatinine, Ser: 0.91 mg/dL (ref 0.44–1.00)
GFR, Estimated: >60 mL/min (ref >60)
Glucose, Bld: 106 mg/dL — ABNORMAL HIGH (ref 70–99)
Potassium: 3.2 mmol/L — ABNORMAL LOW (ref 3.5–5.1)
Sodium: 137 mmol/L (ref 135–145)

## 2022-09-06 LAB — MAGNESIUM: Magnesium: 1.5 mg/dL — ABNORMAL LOW (ref 1.7–2.4)

## 2022-09-06 MED ORDER — METFORMIN HCL ER 750 MG PO TB24: 750.0000 mg | ORAL_TABLET | Freq: Two times a day (BID) | ORAL | 1 refills | Status: AC

## 2022-09-06 MED ORDER — INGREZZA 80 MG PO CAPS
80.0000 mg | ORAL_CAPSULE | Freq: Every day | ORAL | 0 refills | Status: AC
Start: 2022-09-06 — End: ?

## 2022-09-06 MED ORDER — POTASSIUM CHLORIDE CRYS ER 20 MEQ PO TBCR
40.0000 meq | EXTENDED_RELEASE_TABLET | ORAL | Status: AC
  Administered 2022-09-06 (×2): 40 meq via ORAL
  Filled 2022-09-06 (×2): qty 2

## 2022-09-06 MED ORDER — SACCHAROMYCES BOULARDII 250 MG PO CAPS: 250.0000 mg | ORAL_CAPSULE | Freq: Two times a day (BID) | ORAL | 0 refills | Status: AC

## 2022-09-06 MED ORDER — QUETIAPINE FUMARATE ER 400 MG PO TB24: 400.0000 mg | ORAL_TABLET | Freq: Every day | ORAL | 0 refills | Status: AC

## 2022-09-06 MED ORDER — MAGNESIUM OXIDE -MG SUPPLEMENT 400 (240 MG) MG PO TABS
800.0000 mg | ORAL_TABLET | Freq: Two times a day (BID) | ORAL | 0 refills | Status: AC
Start: 2022-09-06 — End: 2022-09-13

## 2022-09-06 MED ORDER — DOXYCYCLINE HYCLATE 100 MG PO TABS
100.0000 mg | ORAL_TABLET | Freq: Two times a day (BID) | ORAL | 0 refills | Status: AC
Start: 2022-09-06 — End: 2022-09-10

## 2022-09-06 MED ORDER — CARIPRAZINE HCL 3 MG PO CAPS: 3.0000 mg | ORAL_CAPSULE | Freq: Every day | ORAL | 0 refills | Status: AC

## 2022-09-06 MED ORDER — OXYCODONE-ACETAMINOPHEN 5-325 MG PO TABS
1.0000 | ORAL_TABLET | Freq: Four times a day (QID) | ORAL | 0 refills | Status: AC | PRN
Start: 2022-09-06 — End: ?

## 2022-09-06 MED ORDER — GABAPENTIN 300 MG PO CAPS: 300.0000 mg | ORAL_CAPSULE | Freq: Every day | ORAL | 0 refills | Status: AC

## 2022-09-06 MED ORDER — MAGNESIUM SULFATE 4 GM/100ML IV SOLN
4.0000 g | Freq: Once | INTRAVENOUS | Status: AC
  Administered 2022-09-06: 4 g via INTRAVENOUS
  Filled 2022-09-06: qty 100

## 2022-09-06 MED ORDER — FAMOTIDINE 20 MG PO TABS: 20.0000 mg | ORAL_TABLET | Freq: Every day | ORAL | 0 refills | Status: AC

## 2022-09-06 NOTE — Progress Notes (Signed)
Physical Therapy Treatment Patient Details Name: [name] MRN: [id number] DOB: [birth year] Today's Date: 09/06/2022   History of Present Illness 56 yo female admitted 9/6 with left knee pain and edema s/p unsuccessful aspiration grossly 2wks PTA as well as in ED. Pt with left knee cellulitis. PMHx: COPD, DM, neuropathy, hypothyroidism, colon CA, cellulits, schizophrenia    PT Comments    Pt progressing well towards her physical therapy goals and is motivated to participate. Able to assist PT with donning suction fit prosthetic edge of bed. Has difficulty with rolling up liner over residual limb due to neuropathy. Pt ambulating 100 ft with a 3 wheeled walker at a min assist level. Demonstrates impaired standing balance, weakness, and sensation impairments. Continue to recommend SNF for ongoing Physical Therapy.       Recommendations for follow up therapy are one component of a multi-disciplinary discharge planning process, led by the attending physician.  Recommendations may be updated based on patient status, additional functional criteria and insurance authorization.  Follow Up Recommendations  Skilled nursing-short term rehab (<3 hours/day) Can patient physically be transported by private vehicle: Yes   Assistance Recommended at Discharge Frequent or constant Supervision/Assistance  Patient can return home with the following A little help with walking and/or transfers;A little help with bathing/dressing/bathroom;Assistance with cooking/housework;Help with stairs or ramp for entrance;Assist for transportation   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 Braces or Orthoses: Other Brace Other Brace: L post op shoe Restrictions Weight Bearing Restrictions: No     Mobility  Bed Mobility Overal bed mobility: Needs Assistance Bed  Mobility: Supine to Sit     Supine to sit: Supervision     General bed mobility comments: Increased time/effort, no physical assist required    Transfers Overall transfer level: Needs assistance Equipment used:  (3 wheeled walker) Transfers: Sit to/from Stand Sit to Stand: Min assist           General transfer comment: Pt using momentum and minA to power up    Ambulation/Gait Ambulation/Gait assistance: Min assist, +2 safety/equipment Gait Distance (Feet): 100 Feet Assistive device:  (3 wheeled walker) Gait Pattern/deviations: Step-to pattern, Decreased stride length Gait velocity: decreased     General Gait Details: Light minA for balance   Stairs             Wheelchair Mobility    Modified Rankin (Stroke Patients Only)       Balance Overall balance assessment: Needs assistance, History of Falls Sitting-balance support: Feet supported Sitting balance-Leahy Scale: Good     Standing balance support: Bilateral upper extremity supported Standing balance-Leahy Scale: Poor                              Cognition Arousal/Alertness: Awake/alert Behavior During Therapy: Flat affect Overall Cognitive Status: Within Functional Limits for tasks assessed                                 General Comments: continues with flat affect but overall cog WFL for all funcitonal tasks        Exercises      General Comments        Pertinent Vitals/Pain Pain Assessment Pain Assessment: Faces Faces Pain Scale: No hurt  Home Living                          Prior Function            PT Goals (current goals can now be found in the care plan section) Acute Rehab PT Goals Potential to Achieve Goals: Good Progress towards PT goals: Progressing toward goals    Frequency    Min 3X/week      PT Plan Current plan remains appropriate    Co-evaluation              AM-PAC PT "6 Clicks" Mobility   Outcome  Measure  Help needed turning from your back to your side while in a flat bed without using bedrails?: A Little Help needed moving from lying on your back to sitting on the side of a flat bed without using bedrails?: A Little Help needed moving to and from a bed to a chair (including a wheelchair)?: A Little Help needed standing up from a chair using your arms (e.g., wheelchair or bedside chair)?: A Little Help needed to walk in hospital room?: A Little Help needed climbing 3-5 steps with a railing? : A Lot 6 Click Score: 17    End of Session Equipment Utilized During Treatment: Gait belt Activity Tolerance: Patient tolerated treatment well Patient left: in chair;with call bell/phone within reach;with chair alarm set Nurse Communication: Mobility status PT Visit Diagnosis: Unsteadiness on feet (R26.81);Other abnormalities of gait and mobility (R26.89);History of falling (Z91.81)     Time: 8242-3536 PT Time Calculation (min) (ACUTE ONLY): 26 min  Charges:  $Therapeutic Activity: 23-37 mins                     Wyona Almas, PT, DPT Acute Rehabilitation Services Office [phone]    Deno Etienne 09/06/2022, 1:39 PM

## 2022-09-06 NOTE — Plan of Care (Signed)
  Problem: Coping: Goal: Ability to adjust to condition or change in health will improve Outcome: Progressing   Problem: Fluid Volume: Goal: Ability to maintain a balanced intake and output will improve Outcome: Progressing   Problem: Health Behavior/Discharge Planning: Goal: Ability to identify and utilize available resources and services will improve Outcome: Progressing Goal: Ability to manage health-related needs will improve Outcome: Progressing   Problem: Metabolic: Goal: Ability to maintain appropriate glucose levels will improve Outcome: Progressing   Problem: Skin Integrity: Goal: Risk for impaired skin integrity will decrease Outcome: Progressing   Problem: Tissue Perfusion: Goal: Adequacy of tissue perfusion will improve Outcome: Progressing

## 2022-09-06 NOTE — Plan of Care (Signed)
  Problem: Education: Goal: Ability to describe self-care measures that may prevent or decrease complications (Diabetes Survival Skills Education) will improve Outcome: Adequate for Discharge Goal: Individualized Educational Video(s) Outcome: Adequate for Discharge   Problem: Coping: Goal: Ability to adjust to condition or change in health will improve 09/06/2022 1548 by Bridgette Habermann, RN Outcome: Adequate for Discharge 09/06/2022 0738 by Bridgette Habermann, RN Outcome: Progressing   Problem: Fluid Volume: Goal: Ability to maintain a balanced intake and output will improve 09/06/2022 1548 by Bridgette Habermann, RN Outcome: Adequate for Discharge 09/06/2022 0738 by Bridgette Habermann, RN Outcome: Progressing   Problem: Health Behavior/Discharge Planning: Goal: Ability to identify and utilize available resources and services will improve 09/06/2022 1548 by Bridgette Habermann, RN Outcome: Adequate for Discharge 09/06/2022 0738 by Bridgette Habermann, RN Outcome: Progressing Goal: Ability to manage health-related needs will improve 09/06/2022 1548 by Bridgette Habermann, RN Outcome: Adequate for Discharge 09/06/2022 0738 by Bridgette Habermann, RN Outcome: Progressing   Problem: Metabolic: Goal: Ability to maintain appropriate glucose levels will improve 09/06/2022 1548 by Bridgette Habermann, RN Outcome: Adequate for Discharge 09/06/2022 0738 by Bridgette Habermann, RN Outcome: Progressing   Problem: Nutritional: Goal: Maintenance of adequate nutrition will improve Outcome: Adequate for Discharge Goal: Progress toward achieving an optimal weight will improve Outcome: Adequate for Discharge   Problem: Skin Integrity: Goal: Risk for impaired skin integrity will decrease 09/06/2022 1548 by Bridgette Habermann, RN Outcome: Adequate for Discharge 09/06/2022 0738 by Bridgette Habermann, RN Outcome: Progressing   Problem: Tissue Perfusion: Goal: Adequacy of tissue perfusion will improve 09/06/2022 1548 by  Bridgette Habermann, RN Outcome: Adequate for Discharge 09/06/2022 0738 by Bridgette Habermann, RN Outcome: Progressing   Problem: Education: Goal: Knowledge of General Education information will improve Description: Including pain rating scale, medication(s)/side effects and non-pharmacologic comfort measures Outcome: Adequate for Discharge   Problem: Health Behavior/Discharge Planning: Goal: Ability to manage health-related needs will improve Outcome: Adequate for Discharge   Problem: Clinical Measurements: Goal: Ability to maintain clinical measurements within normal limits will improve Outcome: Adequate for Discharge Goal: Will remain free from infection Outcome: Adequate for Discharge Goal: Diagnostic test results will improve Outcome: Adequate for Discharge Goal: Respiratory complications will improve Outcome: Adequate for Discharge Goal: Cardiovascular complication will be avoided Outcome: Adequate for Discharge   Problem: Activity: Goal: Risk for activity intolerance will decrease Outcome: Adequate for Discharge   Problem: Nutrition: Goal: Adequate nutrition will be maintained Outcome: Adequate for Discharge   Problem: Coping: Goal: Level of anxiety will decrease Outcome: Adequate for Discharge   Problem: Elimination: Goal: Will not experience complications related to bowel motility Outcome: Adequate for Discharge Goal: Will not experience complications related to urinary retention Outcome: Adequate for Discharge   Problem: Pain Managment: Goal: General experience of comfort will improve Outcome: Adequate for Discharge   Problem: Safety: Goal: Ability to remain free from injury will improve Outcome: Adequate for Discharge   Problem: Skin Integrity: Goal: Risk for impaired skin integrity will decrease Outcome: Adequate for Discharge

## 2022-09-06 NOTE — TOC Transition Note (Signed)
Transition of Care [facility]) - CM/SW Discharge Note   Patient Details  Name: [name] MRN: [id number] Date of Birth: 11-Dec-1966  [facility] of [facility] CM/SW Contact:  Coralee Pesa, [place] Phone Number: 09/06/2022, 1:39 PM  Clinical Narrative:    Pt to be transported to [facility] via [place]. Nurse to call report to [phone]. PASRR [phone] [id number]   Final next level of care: [place] Barriers to Discharge: Barriers Resolved   Patient Goals and CMS Choice Patient states their goals for this hospitalization and ongoing recovery are:: Pt would like to be able to go to rehab. CMS Medicare.gov Compare Post Acute Care list provided to:: Patient Choice offered to / list presented to : Patient  Discharge Placement              Patient chooses bed at:  [facility]) Patient to be transferred to facility by: [place] Name of family member notified: Patient Patient and family notified of of transfer: 09/06/22  Discharge Plan and Services     Post Acute Care Choice: [place]                               Social Determinants of Health (SDOH) Interventions     Readmission Risk Interventions     No data to display

## 2022-09-06 NOTE — Discharge Summary (Addendum)
Physician Discharge Summary   Patient: [name] MRN: [id number] DOB: July 11, 1966  Admit date:     08/31/2022  Discharge date: 09/06/22  Discharge Physician: Berle Mull  PCP: Emelia Loron, NP  Recommendations at discharge: Follow up with PCP in 1 week Follow up with Psych in 1-2 weeks Needs 2 LPM oxygen QHS and PRN with sleep Needs outpt sleep studies Pain meds reduced, pt at risk for polypharmacy, I called pt's primary provider Dorothea Ogle terry's office and told them about pt's presentation and medication changes Repeat BMP and Mg level in 1 week   Contact information for after-discharge care     Destination     [facility] Preferred SNF .   Service: Skilled Chiropodist information: [address] [place] [phone]                    Discharge Diagnoses: Principal Problem:   Cellulitis of left knee Active Problems:   Septic infrapatellar bursitis   Lactic acidosis   Normocytic anemia   Hyponatremia   Hypokalemia   Diabetic neuropathy with neurologic complication (HCC)   Hypertension   Hyperlipemia   PAD (peripheral artery disease) (HCC)   Anxiety and depression   Tobacco abuse   Nausea & vomiting   Hypocalcemia   Renal insufficiency   Septic arthritis [facility])  Hospital Course: PMH of COPD, type II DM, hypothyroidism, schizophrenia, OSA not on CPAP, colon cancer, presented to hospital with complaints of left knee swelling and pain.  Concern about septic arthritis and bursitis.  Orthopedic was consulted. On 9/7 patient is significantly drowsy and lethargic.  Holding multiple psychotropic medications.  Mentation improved with holding medications.   Psychiatry consulted for further assistance with medications. Now medically stable.   Assessment and Plan  Cellulitis with concern for possible septic infrapatellar bursitis Patient presents with complaints of redness and swelling right knee over the last  2 months. X-rays revealed soft tissue swelling without signs of osteomyelitis.  Patient has been ordered Rocephin and vancomycin, but developed rash on Rocephin for which it was stopped.   ED provider attempted aspiration of the knee joint without success. Orthopedic consulted. Continue pain control. MRI negative for osteomyelitis.  Negative for septic arthritis. Wound culture positive for MRSA.  Switch to doxycycline only.  Continue therapy for total 10 days.  Discussed with orthopedic on 9/11.  Agree with the plan.   Depression and anxiety Tardive dyskinesia. Acute metabolic/toxic encephalopathy Concern for schizophrenia.-Ruled out.  No evidence. Patient is significantly drowsy and lethargic on 9/7. Suspect this is multi factorial although most likely polypharmacy related. No focal deficit. Patient is on multiple psychotropic medications. Highly appreciate psychiatry consultation. For now we will continue Seroquel at 400 mg nightly. Per psych recommendation We will decrease Vraylar to 3 mg and switch to daily from QHS due to ongoing issues with insomnia at night and and daytime sleepiness.  Continue Cymbalta 60 mg once daily. We will continue gabapentin 300 mg nightly. Continue Ingrezza for TD. Outpatient follow-up with psychiatrist.  OSA. Not on CPAP. Does not want to use CPAP. Placing the patient has a poor outcome due to her ongoing somnolence. Nocturnal oximetry shows that the pt's oxygen drops below 89% for 60% of time.  Will initiate 2LPM nocturnal oxygen Recommended outpt sleep study.  Recommended to minimize psychotropic meds   Uncontrolled diabetes mellitus type 2 with with hyperglycemia with neuropathy with long-term insulin use Hemoglobin A1c 9.7 showing poor control. Continue Lantus  and sliding scale insulin for now.   Acute kidney injury Patient presents with creatinine of 1.2.  Worsened to 1.3 due to hypotension Baseline creatinine previously noted to be 0.92 on  02/10/2022.   Treated with IV fluids.   Lactic acidosis resolved  Lactic acid elevated at 4.8-> 3.4. Treated with IV fluids.   Normocytic anemia Hemoglobin stable.  Previously was 13 currently stable around 8. No active bleeding seen.   Hyponatremia On admission sodium 121. Currently normal. Treated with IV normal saline. Most likely from poor p.o. intake.   Hypokalemia Replaced. Continue oral meds   Hypomagnesemia  From PPI and poor PO intake Corrected as well.  We will initiate oral supplementation   Hypocalcemia Treated with calcium gluconate. Monitor.   Nausea and vomiting Resolved for now. Monitor.   Essential hypertension Was hypotensive on admission  Meds were on hold.  Now resuming lasix and aldactone   Hypothyroidism Normal TSH normal free t4. Continue Synthroid.   Chronic pain Continue oxycodone as needed for pain minimize narcotics. Reduce percocet from 10/325 to 5/325.    PAD Continue statin   History of colon cancer Status post resection in 2016.  Patient denies any reports of bleeding.   Nicotine use Patient currently uses electronic cigarettes   GERD Continue treatment with Pepcid.    Obesity Body mass index is 34.95 kg/m.  Placing the pt at higher risk of poor outcomes.  Pain control - [employer] Controlled Substance Reporting System database was reviewed. and patient was instructed, not to drive, operate heavy machinery, perform activities at heights, swimming or participation in water activities or provide baby-sitting services while on Pain, Sleep and Anxiety Medications; until their outpatient Physician has advised to do so again. Also recommended to not to take more than prescribed Pain, Sleep and Anxiety Medications.  Consultants:  Orthopedics   Procedures performed:  Bedside I and D  DISCHARGE MEDICATION: Allergies as of 09/06/2022       Reactions   Latex Itching, Rash, Other (See Comments)   Pt states she cannot use  condoms - cause an infection.  Use of latex on skin is okay for short period of time.  Tape causes rash   Sweetening Enhancer [flavoring Agent] Nausea And Vomiting, Other (See Comments)   HEADACHES   Amoxicillin Nausea And Vomiting   Ropinirole    Pt stated, "I am getting cramps in my legs - especially at night"   Aspartame And Phenylalanine Nausea And Vomiting   HEADACHES   Ceftriaxone Rash   -rash between thighs after running for 10-15 minutes.   Ibuprofen Other (See Comments)   HEADACHES   Trazodone And Nefazodone Other (See Comments)   Hallucinations        Medication List     STOP taking these medications    bisoprolol 5 MG tablet Commonly known as: ZEBETA   Daridorexant HCl 50 MG Tabs   Eszopiclone 3 MG Tabs   FreeStyle Libre 2 Sensor Misc   gabapentin 600 MG tablet Commonly known as: NEURONTIN Replaced by: gabapentin 300 MG capsule   glucose blood test strip Commonly known as: FREESTYLE TEST STRIPS   insulin lispro 100 UNIT/ML KwikPen Commonly known as: HumaLOG KwikPen   lisinopril 20 MG tablet Commonly known as: ZESTRIL   omeprazole 40 MG capsule Commonly known as: PRILOSEC   ondansetron 4 MG disintegrating tablet Commonly known as: ZOFRAN-ODT   oxyCODONE-acetaminophen 10-325 MG tablet Commonly known as: PERCOCET Replaced by: oxyCODONE-acetaminophen 5-325 MG tablet   [employer]  25 MG Tabs Generic drug: Daridorexant HCl       TAKE these medications    BD Pen Needle Nano 2nd Gen 32G X 4 MM Misc Generic drug: Insulin Pen Needle 1 each by Does not apply route daily. Use to inject insulin daily.   cariprazine 3 MG capsule Commonly known as: VRAYLAR Take 1 capsule (3 mg total) by mouth daily. Start taking on: September 07, 2022 What changed:  medication strength how much to take when to take this   doxycycline 100 MG tablet Commonly known as: VIBRA-TABS Take 1 tablet (100 mg total) by mouth 2 (two) times daily for 4 days.   DULoxetine 30  MG capsule Commonly known as: CYMBALTA Take 60 mg by mouth at bedtime.   famotidine 20 MG tablet Commonly known as: PEPCID Take 1 tablet (20 mg total) by mouth daily.   fenofibrate 145 MG tablet Commonly known as: Tricor Take 1 tablet (145 mg total) by mouth daily.   FreeStyle Libre 2 Reader [employer] Use to check blood sugar daily   furosemide 20 MG tablet Commonly known as: LASIX TAKE 1 TABLET (20 MG TOTAL) BY MOUTH DAILY. FOR LEG SWELLING   gabapentin 300 MG capsule Commonly known as: NEURONTIN Take 1 capsule (300 mg total) by mouth at bedtime. Replaces: gabapentin 600 MG tablet   HAIR SKIN AND NAILS FORMULA PO Take 2 tablets by mouth in the morning and at bedtime. gummy   Ingrezza 80 MG capsule Generic drug: valbenazine Take 1 capsule (80 mg total) by mouth at bedtime.   Jardiance 25 MG Tabs tablet Generic drug: empagliflozin TAKE 1 TABLET BY MOUTH EVERY DAY   Klor-Con M10 10 MEQ tablet Generic drug: potassium chloride Take 10 mEq by mouth 2 (two) times daily.   Lantus SoloStar 100 UNIT/ML Solostar Pen Generic drug: insulin glargine Injuect 10 units daily, if blood sugars are 150-200 only inject 6 units What changed:  how much to take how to take this when to take this additional instructions   levothyroxine 25 MCG tablet Commonly known as: SYNTHROID TAKE 1 TABLET BY MOUTH EVERY DAY BEFORE BREAKFAST What changed: See the new instructions.   magnesium oxide 400 (240 Mg) MG tablet Commonly known as: MAG-OX Take 2 tablets (800 mg total) by mouth 2 (two) times daily for 7 days.   metFORMIN 750 MG 24 hr tablet Commonly known as: GLUCOPHAGE-XR Take 1 tablet (750 mg total) by mouth 2 (two) times daily. What changed:  how much to take when to take this   oxyCODONE-acetaminophen 5-325 MG tablet Commonly known as: PERCOCET/ROXICET Take 1 tablet by mouth every 6 (six) hours as needed for moderate pain or severe pain. Replaces: oxyCODONE-acetaminophen 10-325 MG  tablet   QUEtiapine 400 MG 24 hr tablet Commonly known as: SEROQUEL XR Take 1 tablet (400 mg total) by mouth at bedtime. What changed: how much to take   saccharomyces boulardii 250 MG capsule Commonly known as: FLORASTOR Take 1 capsule (250 mg total) by mouth 2 (two) times daily for 14 days.   spironolactone 25 MG tablet Commonly known as: ALDACTONE Take 1 tablet (25 mg total) by mouth daily.   Victoza 18 MG/3ML Sopn Generic drug: liraglutide INJECT 1.8 MG UNDER THE SKIN ONCE DAILY               Discharge Care Instructions  (From admission, onward)           Start     Ordered   09/06/22 0000  Discharge  wound care:       Comments: Wash with antibacterial soap twice daily. Dry dressing to cover.   09/06/22 1314           Disposition: SNF Diet recommendation: Carb modified diet  Discharge Exam: Vitals:   09/05/22 1606 09/05/22 2021 09/05/22 2320 09/06/22 0408  BP: (!) 176/86 (!) 174/73 (!) 179/81 (!) 153/92  Pulse: 92   76  Resp:  '20 16 10  '$ Temp: 98 F (36.7 C) 98.1 F (36.7 C) 98.1 F (36.7 C) 98 F (36.7 C)  TempSrc: Oral Oral  Oral  SpO2: 99% 99% 93% 97%  Weight:      Height:       General: Appear in mild distress; no visible Abnormal Neck Mass Or lumps, Conjunctiva normal Cardiovascular: S1 and S2 Present, no Murmur, Respiratory: good respiratory effort, Bilateral Air entry present and CTA, no Crackles, no wheezes Abdomen: Bowel Sound present, Non tender  Extremities: trace Pedal edema, right BKA Neurology: alert and oriented to time, place, and person chronic tremors Filed Weights   08/31/22 1253  Weight: 95.3 kg   Condition at discharge: stable  The results of significant diagnostics from this hospitalization (including imaging, microbiology, ancillary and laboratory) are listed below for reference.   Imaging Studies: MR TIBIA FIBULA LEFT W WO CONTRAST  Result Date: 09/01/2022 CLINICAL DATA:  Anterior left knee swelling with pain and  redness for 3 months. History of recent incision and drainage with packing material in place EXAM: MRI OF LOWER LEFT EXTREMITY WITHOUT AND WITH CONTRAST TECHNIQUE: Multiplanar, multisequence MR imaging of the left tibia and fibula was performed both before and after administration of intravenous contrast. CONTRAST:  41m GADAVIST GADOBUTROL 1 MMOL/ML IV SOLN COMPARISON:  X-ray 08/31/2022 FINDINGS: Bones/Joint/Cartilage Orthopedic hardware within the tibial tubercle results in metallic susceptibility artifact which degrades evaluation of the adjacent bony and soft tissue structures. No acute fracture or dislocation. Tricompartmental osteoarthritis of the left knee with joint space loss and marginal osteophyte formation most pronounced in the medial compartment. No evidence of bone erosion or marrow replacement. No bone marrow edema. No suspicious bone lesion. Ligaments Grossly intact. Muscles and Tendons Generalized muscle atrophy and fatty infiltration. Intact patellar tendon. Soft tissues Prominent soft tissue thickening anterior to the patellar tendon and tibial tuberosity with low T1/T2 signal material centrally, likely packing material. No appreciable fluid component. No definite postcontrast enhancement, although signal alterations from metallic hardware degrades evaluation. Mild generalized subcutaneous edema. No organized fluid collections. No abnormal postcontrast enhancement is seen. IMPRESSION: 1. Prominent soft tissue thickening anterior to the patellar tendon and tibial tuberosity with low signal material centrally, likely packing material. No significant internal fluid content or appreciable postcontrast enhancement, although signal alterations from metallic hardware degrades evaluation. 2. No evidence of osteomyelitis. 3. Tricompartmental osteoarthritis of the left knee, most pronounced in the medial compartment. Electronically Signed   By: NDavina PokeD.O.   On: 09/01/2022 14:31   DG Knee 2  Views Left  Result Date: 08/31/2022 CLINICAL DATA:  Large fluctuant mass involving the proximal aspect of the left tibia. EXAM: LEFT KNEE - 1-2 VIEW COMPARISON:  01/20/2020 FINDINGS: Focal soft tissue swelling about the tibial tuberosity without associated fracture or radiopaque foreign body. No discrete areas of osteolysis to suggest osteomyelitis. No subcutaneous emphysema. No patella baja or Alta deformity. Stable sequela of previous ACL repair without evidence of loosening of the radiopaque components. Moderate tricompartmental degenerative change of the knee, worse within the medial compartment with joint  space loss, subchondral sclerosis and osteophytosis. Several loose bodies are noted about the posterior aspect of the knee joint space though discrete donor sites are not identified. Enthesopathic change involving the superior and inferior poles of the la. IMPRESSION: 1. Nonspecific focal soft tissue swelling about the tibial tuberosity without associated fracture, radiopaque foreign body radiographic evidence of osteomyelitis. 2. Stable sequela of previous ACL repair without evidence of loosening of the radiopaque components. 3. 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 Electronically Signed   By: Sandi Mariscal M.D.   On: 08/31/2022 13:43    Microbiology: Results for orders placed or performed during the hospital encounter of 08/31/22  Culture, blood (routine x 2)     Status: None   Collection Time: 08/31/22  3:24 PM   Specimen: BLOOD  Result Value Ref Range Status   Specimen Description BLOOD LEFT ANTECUBITAL  Final   Special Requests   Final    BOTTLES DRAWN AEROBIC AND ANAEROBIC Blood Culture results may not be optimal due to an inadequate volume of blood received in culture bottles   Culture   Final    NO GROWTH 5 DAYS Performed at [facility], [place] [address], [place], [place] [address]    Report Status 09/05/2022 FINAL  Final   Culture, blood (routine x 2)     Status: None   Collection Time: 08/31/22  3:28 PM   Specimen: BLOOD  Result Value Ref Range Status   Specimen Description BLOOD RIGHT ANTECUBITAL  Final   Special Requests   Final    BOTTLES DRAWN AEROBIC AND ANAEROBIC Blood Culture results may not be optimal due to an inadequate volume of blood received in culture bottles   Culture   Final    NO GROWTH 5 DAYS Performed at [facility], [place] [address] [place], [place] [address]    Report Status 09/05/2022 FINAL  Final  SARS Coronavirus 2 by RT PCR (hospital order, performed in [facility] hospital lab) *cepheid single result test*     Status: None   Collection Time: 08/31/22  4:26 PM   Specimen: Nasal Swab  Result Value Ref Range Status   SARS Coronavirus 2 by RT PCR NEGATIVE NEGATIVE Final    Comment: (NOTE) SARS-CoV-2 target nucleic acids are NOT DETECTED.  The SARS-CoV-2 RNA is generally detectable in upper and lower respiratory specimens during the acute phase of infection. The lowest concentration of SARS-CoV-2 viral copies this assay can detect is 250 copies / mL. A negative result does not preclude SARS-CoV-2 infection and should not be used as the sole basis for treatment or other patient management decisions.  A negative result may occur with improper specimen collection / handling, submission of specimen other than nasopharyngeal swab, presence of viral mutation(s) within the areas targeted by this assay, and inadequate number of viral copies (<250 copies / mL). A negative result must be combined with clinical observations, patient history, and epidemiological information.  Fact Sheet for Patients:   https://www..info/  Fact Sheet for Healthcare Providers: https://hall.com/  This test is not yet approved or  cleared by the [place] FDA and has been authorized for detection and/or diagnosis of SARS-CoV-2 by FDA under an  Emergency Use Authorization (EUA).  This EUA will remain in effect (meaning this test can be used) for the duration of the COVID-19 declaration under Section 564(b)(1) of the Act, 21 U.S.C. section 360bbb-3(b)(1), unless the authorization is terminated or revoked sooner.  Performed at [facility], [place] [address] [place], [place] [address]   Aerobic Culture w Gram Stain (superficial specimen)     Status: None   Collection Time: 08/31/22  5:04 PM   Specimen: Wound  Result Value Ref Range Status   Specimen Description WOUND  Final   Special Requests NONE  Final   Gram Stain   Final    NO WBC SEEN NO ORGANISMS SEEN Performed at [facility], [address], [place], [place] [address]    Culture RARE METHICILLIN RESISTANT STAPHYLOCOCCUS AUREUS  Final   Report Status 09/03/2022 FINAL  Final   Organism ID, Bacteria METHICILLIN RESISTANT STAPHYLOCOCCUS AUREUS  Final      Susceptibility   Methicillin resistant staphylococcus aureus - MIC*    CIPROFLOXACIN >=8 RESISTANT Resistant     ERYTHROMYCIN >=8 RESISTANT Resistant     GENTAMICIN <=0.5 SENSITIVE Sensitive     OXACILLIN >=4 RESISTANT Resistant     TETRACYCLINE <=1 SENSITIVE Sensitive     VANCOMYCIN 1 SENSITIVE Sensitive     TRIMETH/SULFA <=10 SENSITIVE Sensitive     CLINDAMYCIN <=0.25 SENSITIVE Sensitive     RIFAMPIN <=0.5 SENSITIVE Sensitive     Inducible Clindamycin NEGATIVE Sensitive     * RARE METHICILLIN RESISTANT STAPHYLOCOCCUS AUREUS  MRSA Next Gen by PCR, Nasal     Status: Abnormal   Collection Time: 09/02/22  5:31 PM   Specimen: Nasal Mucosa; Nasal Swab  Result Value Ref Range Status   MRSA by PCR Next Gen DETECTED (A) NOT DETECTED Final    Comment: RESULT CALLED TO, READ BACK BY AND VERIFIED WITH: RN SHARON WHITEHORN ON 09/02/22 @ 2102 BY DRT (NOTE) The GeneXpert MRSA Assay (FDA approved for NASAL specimens only), is one component of a comprehensive MRSA colonization surveillance program. It is not  intended to diagnose MRSA infection nor to guide or monitor treatment for MRSA infections. Test performance is not FDA approved in patients less than 52 years old. Performed at [facility], [place] [address], [place], [place] [address]    *Note: Due to a large number of results and/or encounters for the requested time period, some results have not been displayed. A complete set of results can be found in Results Review.   Labs: CBC: Recent Labs  Lab 09/01/22 0254 09/03/22 1017 09/04/22 0047 09/05/22 0141 09/06/22 0125  WBC 14.7* 7.0 11.0* 8.4 8.5  NEUTROABS  --  4.3  --   --   --   HGB 8.6* 8.4* 8.3* 8.3* 9.1*  HCT 24.2* 23.8* 24.0* 24.0* 27.4*  MCV 85.8 88.1 87.6 88.2 90.1  PLT 307 275 322 315 914   Basic Metabolic Panel: Recent Labs  Lab 09/01/22 1021 09/01/22 1738 09/02/22 1758 09/03/22 1017 09/04/22 0047 09/05/22 0141 09/06/22 0125  NA 125*   < > 133* 138 131* 135 137  K 3.0*   < > 3.7 4.4 4.2 3.7 3.2*  CL 84*   < > 97* 106 98 102 102  CO2 26   < > '27 25 24 25 26  '$ GLUCOSE 132*   < > 226* 157* 176* 174* 106*  BUN 11   < > '11 8 10 11 11  '$ CREATININE 1.31*   < > 1.02* 1.08* 1.05* 1.13* 0.91  CALCIUM 7.8*   < > 8.4* 8.0* 8.2* 8.6* 8.9  MG 1.4*  --   --  1.2* 1.3* 1.3* 1.5*  PHOS 3.6  --   --   --   --   --   --    < > =  values in this interval not displayed.   Liver Function Tests: Recent Labs  Lab 08/31/22 1300 09/01/22 1021 09/03/22 1017  AST '24 20 15  '$ ALT '16 16 14  '$ ALKPHOS 62 51 47  BILITOT 0.6 0.4 0.1*  PROT 5.9* 5.4* 5.0*  ALBUMIN 3.4* 3.0* 2.7*   CBG: Recent Labs  Lab 09/05/22 1605 09/05/22 1801 09/05/22 2006 09/06/22 0742 09/06/22 1113  GLUCAP 277* 196* 250* 152* 188*    Discharge time spent: greater than 30 minutes.  Signed: Berle Mull, MD [facility]

## 2022-09-08 ENCOUNTER — Other Ambulatory Visit (HOSPITAL_COMMUNITY): Payer: Self-pay

## 2022-09-08 MED ORDER — OXYCODONE-ACETAMINOPHEN 10-325 MG PO TABS
1.0000 | ORAL_TABLET | ORAL | 0 refills | Status: AC | PRN
  Filled 2022-09-08: qty 180, 30d supply, fill #0

## 2022-09-22 ENCOUNTER — Other Ambulatory Visit (HOSPITAL_COMMUNITY): Payer: Self-pay

## 2022-09-22 MED ORDER — VICTOZA 18 MG/3ML ~~LOC~~ SOPN
1.8000 mg | PEN_INJECTOR | Freq: Every day | SUBCUTANEOUS | 2 refills | Status: AC
  Filled 2022-09-22: qty 27, 90d supply, fill #0

## 2022-09-22 MED ORDER — JARDIANCE 25 MG PO TABS
25.0000 mg | ORAL_TABLET | Freq: Every day | ORAL | 3 refills | Status: AC
  Filled 2022-09-22: qty 90, 90d supply, fill #0

## 2022-09-22 MED ORDER — GABAPENTIN 300 MG PO CAPS
300.0000 mg | ORAL_CAPSULE | Freq: Every day | ORAL | 2 refills | Status: AC
  Filled 2022-09-22: qty 30, 30d supply, fill #0

## 2022-09-22 MED ORDER — METFORMIN HCL ER 750 MG PO TB24
1500.0000 mg | ORAL_TABLET | Freq: Every day | ORAL | 3 refills | Status: AC
  Filled 2022-09-22: qty 180, 90d supply, fill #0

## 2022-09-22 MED ORDER — LANTUS SOLOSTAR 100 UNIT/ML ~~LOC~~ SOPN
10.0000 [IU] | PEN_INJECTOR | Freq: Every day | SUBCUTANEOUS | 2 refills | Status: AC
  Filled 2022-09-22: qty 3, 30d supply, fill #0

## 2022-10-05 ENCOUNTER — Other Ambulatory Visit (HOSPITAL_COMMUNITY): Payer: Self-pay

## 2022-10-05 MED ORDER — OXYCODONE-ACETAMINOPHEN 5-325 MG PO TABS
ORAL_TABLET | ORAL | 0 refills | Status: AC
  Filled 2022-10-05 – 2022-10-06 (×2): qty 120, 30d supply, fill #0

## 2022-10-06 ENCOUNTER — Other Ambulatory Visit (HOSPITAL_COMMUNITY): Payer: Self-pay

## 2022-10-08 ENCOUNTER — Other Ambulatory Visit (HOSPITAL_COMMUNITY): Payer: Self-pay

## 2022-10-17 ENCOUNTER — Encounter (HOSPITAL_BASED_OUTPATIENT_CLINIC_OR_DEPARTMENT_OTHER): Payer: 59 | Attending: Internal Medicine | Admitting: Internal Medicine

## 2022-10-17 DIAGNOSIS — E11622 Type 2 diabetes mellitus with other skin ulcer: Secondary | ICD-10-CM | POA: Diagnosis present

## 2022-10-17 DIAGNOSIS — Z89511 Acquired absence of right leg below knee: Secondary | ICD-10-CM

## 2022-10-17 DIAGNOSIS — F319 Bipolar disorder, unspecified: Secondary | ICD-10-CM

## 2022-10-17 DIAGNOSIS — Z794 Long term (current) use of insulin: Secondary | ICD-10-CM | POA: Diagnosis not present

## 2022-10-17 DIAGNOSIS — L97822 Non-pressure chronic ulcer of other part of left lower leg with fat layer exposed: Secondary | ICD-10-CM | POA: Diagnosis not present

## 2022-10-17 NOTE — Progress Notes (Signed)
JURY, CASERTA ([id number]) [id number] Page 1 of 4 Visit Report for 10/17/2022 Abuse Risk Screen Details Patient Name: Date of Service: [name], [name] 10/17/2022 1:15 PM Medical Record Number: [id number] Patient Account Number: [id number] Date of Birth/Sex: Treating RN: October 02, 1966 (56 y.o. Helene Shoe, Meta.Reding Primary Care Hughey Rittenberry: Emelia Loron Other Clinician: Referring Dalani Mette: Treating Victoria Henshaw/Extender: Inocente Salles in Treatment: 0 Abuse Risk Screen Items Answer ABUSE RISK SCREEN: Has anyone close to you tried to hurt or harm you recentlyo No Do you feel uncomfortable with anyone in your familyo No Has anyone forced you do things that you didnt want to doo No Electronic Signature(s) Signed: 10/17/2022 5:18:37 PM By: Deon Pilling RN, BSN Entered By: Deon Pilling on 10/17/2022 13:20:29 -------------------------------------------------------------------------------- Activities of Daily Living Details Patient Name: Date of Service: [name], [name] 10/17/2022 1:15 PM Medical Record Number: [id number] Patient Account Number: [id number] Date of Birth/Sex: Treating RN: 1966/06/18 (56 y.o. Helene Shoe, Tammi Klippel Primary Care Denise Washburn: Emelia Loron Other Clinician: Referring Hamad Whyte: Treating Ki Corbo/Extender: Inocente Salles in Treatment: 0 Activities of Daily Living Items Answer Activities of Daily Living (Please select one for each item) Drive Automobile Completely Able T Medications ake Completely Able Use T elephone Completely Able Care for Appearance Completely Able Use T oilet Completely Able Bath / Shower Completely Able Dress Self Completely Able Feed Self Completely Able Walk Completely Able Get In / Out Bed Completely Able Housework Not Able Prepare Meals Not Able Handle Money Need Assistance Shop for Self Need Assistance Electronic Signature(s) Signed: 10/17/2022 5:18:37 PM By: Deon Pilling RN, BSN Entered By: Deon Pilling on 10/17/2022 13:21:05 [name] ([id number]) [id number] Page 2 of 4 -------------------------------------------------------------------------------- Education Screening Details Patient Name: Date of Service: [name], [name] 10/17/2022 1:15 PM Medical Record Number: [id number] Patient Account Number: [id number] Date of Birth/Sex: Treating RN: July 10, 1966 (56 y.o. Helene Shoe, Tammi Klippel Primary Care Raymone Pembroke: Emelia Loron Other Clinician: Referring Isbella Arline: Treating Kirill Chatterjee/Extender: Inocente Salles in Treatment: 0 Primary Learner Assessed: Patient Learning Preferences/Education Level/Primary Language Learning Preference: Explanation, Demonstration, Printed Material Highest Education Level: Grade School Preferred Language: English Cognitive Barrier Language Barrier: No Translator Needed: No Memory Deficit: No Emotional Barrier: No Cultural/Religious Beliefs Affecting Medical Care: No Physical Barrier Impaired Vision: No Impaired Hearing: No Decreased Hand dexterity: No Knowledge/Comprehension Knowledge Level: High Comprehension Level: High Ability to understand written instructions: High Ability to understand verbal instructions: High Motivation Anxiety Level: Calm Cooperation: Cooperative Education Importance: Acknowledges Need Interest in Health Problems: Asks Questions Perception: Coherent Willingness to Engage in Self-Management High Activities: Readiness to Engage in Self-Management High Activities: Electronic Signature(s) Signed: 10/17/2022 5:18:37 PM By: Deon Pilling RN, BSN Entered By: Deon Pilling on 10/17/2022 13:21:29 -------------------------------------------------------------------------------- Fall Risk Assessment Details Patient Name: Date of Service: [name], [name] 10/17/2022 1:15 PM Medical Record Number: [id number] Patient Account Number:  [id number] Date of Birth/Sex: Treating RN: April 14, 1966 (56 y.o. Debby Bud Primary Care Sherle Mello: Emelia Loron Other Clinician: Referring Braelyn Bordonaro: Treating Callum Wolf/Extender: Inocente Salles in Treatment: 0 Fall Risk Assessment Items Have you had 2 or more falls in the last [address] TRANIECE, BOFFA ([id number]) [phone] [id number] Page 3 of 4 Have you had any fall that resulted in injury in the last 12 monthso 0 Yes FALLS RISK SCREEN History of falling - immediate or within 3 months 0 No Secondary diagnosis (Do you have 2 or more medical diagnoseso) 0 No Ambulatory aid None/bed rest/wheelchair/nurse 0 Yes Crutches/cane/walker  0 No Furniture 0 No Intravenous therapy Access/Saline/Heparin Lock 0 No Gait/Transferring Normal/ bed rest/ wheelchair 0 No Weak (short steps with or without shuffle, stooped but able to lift head while walking, may seek 10 Yes support from furniture) Impaired (short steps with shuffle, may have difficulty arising from chair, head down, impaired 0 No balance) Mental Status Oriented to own ability 0 Yes Electronic Signature(s) Signed: 10/17/2022 5:18:37 PM By: Deon Pilling RN, BSN Entered By: Deon Pilling on 10/17/2022 13:21:46 -------------------------------------------------------------------------------- Foot Assessment Details Patient Name: Date of Service: [name], [name] 10/17/2022 1:15 PM Medical Record Number: [id number] Patient Account Number: [id number] Date of Birth/Sex: Treating RN: [birth year] (56 y.o. Helene Shoe, Tammi Klippel Primary Care Kaimana Neuzil: Emelia Loron Other Clinician: Referring Rylee Nuzum: Treating Lesslie Mossa/Extender: Inocente Salles in Treatment: 0 Foot Assessment Items Site Locations + = Sensation present, - = Sensation absent, C = Callus, U = Ulcer R = Redness, W = Warmth, M = Maceration, PU = Pre-ulcerative lesion F = Fissure, S = Swelling, D =  Dryness Assessment Right: Left: Other Deformity: No No Prior Foot Ulcer: No No Prior Amputation: Yes No Charcot Joint: No No Ambulatory Status: Ambulatory With Help GaitTIFFANY, CALMES ([id number]) [phone] [id number] Page 4 of 4 Electronic Signature(s) Signed: 10/17/2022 5:18:37 PM By: Deon Pilling RN, BSN Entered By: Deon Pilling on 10/17/2022 13:24:08 -------------------------------------------------------------------------------- Nutrition Risk Screening Details Patient Name: Date of Service: [name], [name] 10/17/2022 1:15 PM Medical Record Number: [id number] Patient Account Number: [id number] Date of Birth/Sex: Treating RN: 06/20/1966 (56 y.o. Helene Shoe, Meta.Reding Primary Care Ludwika Rodd: Emelia Loron Other Clinician: Referring Yamileth Hayse: Treating Archita Lomeli/Extender: Inocente Salles in Treatment: 0 Height (in): 65 Weight (lbs): 210 Body Mass Index (BMI): 34.9 Nutrition Risk Screening Items Score Screening NUTRITION RISK SCREEN: I have an illness or condition that made me change the kind and/or amount of food I eat 2 Yes I eat fewer than two meals per day 0 No I eat few fruits and vegetables, or milk products 0 No I have three or more drinks of beer, liquor or wine almost every day 0 No I have tooth or mouth problems that make it hard for me to eat 0 No I don't always have enough money to buy the food I need 0 No I eat alone most of the time 0 No I take three or more different prescribed or over-the-counter drugs a day 1 Yes Without wanting to, I have lost or gained 10 pounds in the last six months 0 No I am not always physically able to shop, cook and/or feed myself 2 Yes Nutrition Protocols Good Risk Protocol Provide education on elevated blood Moderate Risk Protocol 0 sugars and impact on wound healing, as applicable High Risk Proctocol Risk Level: Moderate Risk Score: 5 Electronic Signature(s) Signed:  10/17/2022 5:18:37 PM By: Deon Pilling RN, BSN Entered By: Deon Pilling on 10/17/2022 13:23:52

## 2022-10-20 NOTE — Progress Notes (Signed)
SHERIA, ROSELLO ([id number]) [id number] Page 1 of 10 Visit Report for 10/17/2022 Chief Complaint Document Details Patient Name: Date of Service: [name], [name] 10/17/2022 1:15 PM Medical Record Number: [id number] Patient Account Number: [id number] Date of Birth/Sex: Treating RN: July 12, 1966 (56 y.o. F) Primary Care Provider: Emelia Loron Other Clinician: Referring Provider: Treating Provider/Extender: Inocente Salles in Treatment: 0 Information Obtained from: Patient Chief Complaint 10/17/2022; left knee wound Electronic Signature(s) Signed: 10/17/2022 3:32:17 PM By: Kalman Shan DO Entered By: Kalman Shan on 10/17/2022 13:57:34 -------------------------------------------------------------------------------- HPI Details Patient Name: Date of Service: [name], [name] 10/17/2022 1:15 PM Medical Record Number: [id number] Patient Account Number: [id number] Date of Birth/Sex: Treating RN: 09-20-1966 (56 y.o. F) Primary Care Provider: Emelia Loron Other Clinician: Referring Provider: Treating Provider/Extender: Inocente Salles in Treatment: 0 History of Present Illness Location: right plantar foot Quality: Patient reports No Pain. Severity: Patient states wound(s) are getting worse. Duration: Patient has had the wound for > 3 months prior to seeking treatment at the wound center Context: The wound would happen gradually Modifying Factors: Patient wound(s)/ulcer(s) are worsening due to : thickened skin in this area HPI Description: 56 year old patient who has diabetes mellitus and this is uncontrolled and her last hemoglobin A1c done was 8.3 on 11/09/2015. She is also had colon cancer recently. She also has chronic pain syndrome, gallbladder disease, and several other medical comorbidities. her past medical history significant for hypertension, GERD, insomnia, bipolar disorder, gastroparesis, chronic pain,  anxiety neurosis, COPD, history of alcohol abuse, peripheral vascular disease workup with ABIs on the right 0.97 and left 0.94 in January 2015, sleep apnea, status post knee arthroscopy 2, status post foot surgery, status post anterior cervical discectomy, total abdominal hysterectomy, several cystoscopies, laparoscopic sigmoid colectomy in April 2016. She was a very heavy smoker before and has quit smoking tobacco and uses E cigarettes now. She does drink alcohol and does not admit to any recent illicit drug use. She says about a year ago she had a callus in a similar spot and this caused a lot of discomfort and she had an open ulcer and this time around she wanted to come to the wound center before it got worse. no recent x-ray of the foot has been done. 10/17/2022 [name] is a 56 year old female with a past medical history of insulin dependent, uncontrolled type 2 diabetes with last hemoglobin A1c of 9, bipolar disorder, and right BKA that presents to the clinic for left knee wound. She was hospitalized on 08/31/2022 for cellulitis concerning for septic infrapatellar bursitis of the left knee. The ED provider attempted to aspirate the knee joint however no fluid was obtained. She had a wound culture that showed MRSA. She completed a course of doxycycline. With oral antibiotics her swelling and redness improved. However she had a wound that has gotten larger. She states that since she was discharged from the hospital she has been using a gel to the wound bed. It is unclear what the exact product is. She states she crawls around and uses a very thin knee pad to the left knee to do so. She currently denies signs of infection. Electronic Signature(s) Signed: 10/17/2022 3:32:17 PM By: Dian Situ ([id number]) [id number] Page 2 of 10 Entered By: Kalman Shan on 10/17/2022  14:02:39 -------------------------------------------------------------------------------- Physical Exam Details Patient Name: Date of Service: [name], [name] 10/17/2022 1:15 PM Medical Record Number: [id number] Patient Account Number: [id number] Date of Birth/Sex: Treating RN: Aug 25, 1966 [phone]  y.o. F) Primary Care Provider: Emelia Loron Other Clinician: Referring Provider: Treating Provider/Extender: Inocente Salles in Treatment: 0 Constitutional respirations regular, non-labored and within target range for patient.. Cardiovascular 2+ dorsalis pedis/posterior tibialis pulses. Psychiatric pleasant and cooperative. Notes Left lower extremity: T the knee there is an open wound with undermining. Tissue does not appear overly healthy. No signs of surrounding infection. o Electronic Signature(s) Signed: 10/17/2022 3:32:17 PM By: Kalman Shan DO Entered By: Kalman Shan on 10/17/2022 14:03:17 -------------------------------------------------------------------------------- Physician Orders Details Patient Name: Date of Service: [name], [name]. 10/17/2022 1:15 PM Medical Record Number: [id number] Patient Account Number: [id number] Date of Birth/Sex: Treating RN: January 07, 1966 (56 y.o. [name], [name] Primary Care Provider: Emelia Loron Other Clinician: Referring Provider: Treating Provider/Extender: Inocente Salles in Treatment: 0 Verbal / Phone Orders: No Diagnosis Coding ICD-10 Coding Code Description [phone] Acquired absence of right leg below knee E11.622 Type 2 diabetes mellitus with other skin ulcer F31.9 Bipolar disorder, unspecified L97.822 Non-pressure chronic ulcer of other part of left lower leg with fat layer exposed Follow-up Appointments ppointment in 2 weeks. - Dr. Heber [place] (any day) Return A Anesthetic (In clinic) Topical Lidocaine 5% applied to wound bed Bathing/ Shower/ Hygiene May shower with protection but  do not get wound dressing(s) wet. Edema Control - Lymphedema / SCD / Other Elevate legs to the level of the heart or above for 30 minutes daily and/or when sitting, a frequency of: Avoid standing for long periods of time. EMRI, SAMPLE ([id number]) [id number] Page 3 of 10 Off-Loading Other: - Keep pressure off of left knee Wound Treatment Wound #3 - Knee Wound Laterality: Left, Distal Cleanser: Anasept Antimicrobial Skin and Wound Cleanser, 8 (oz) (DME) (Generic) 1 x Per Day/15 Days Discharge Instructions: Cleanse the wound with Anasept cleanser prior to applying a clean dressing using gauze sponges, not tissue or cotton balls. Prim Dressing: Promogran Prisma Matrix, 4.34 (sq in) (silver collagen) (DME) (Generic) 1 x Per Day/15 Days ary Discharge Instructions: Moisten collagen with saline or hydrogel Secondary Dressing: Zetuvit Plus Silicone Border Dressing 5x5 (in/in) (DME) (Generic) 1 x Per Day/15 Days Discharge Instructions: Apply silicone border over primary dressing as directed. [place] plastics related to left knee wound. - (ICD10 [id number] - Non-pressure chronic ulcer of other part of left lower leg with fat layer exposed) Patient Medications llergies: latex, ibuprofen, Tetracyclines, triazolam, aspartame, trazodone, phenylalanine A Notifications Medication Indication Start End PRN debridements/pain10/23/2023 lidocaine DOSE topical 5 % gel - gel topical Electronic Signature(s) Signed: 10/17/2022 5:18:37 PM By: Deon Pilling RN, BSN Signed: 10/20/2022 10:15:01 AM By: Kalman Shan DO Previous Signature: 10/17/2022 3:32:17 PM Version By: Kalman Shan DO Entered By: Deon Pilling on 10/17/2022 17:08:23 Prescription 10/17/2022 -------------------------------------------------------------------------------- [name]. Kalman Shan DO Patient Name: Provider: [birth year] [id number] Date of Birth: NPI#: [id number] Sex: DEA #: [phone] Phone #: License #: [place] Patient Address: [address], [place] [address] [place], [place] [address] [phone] Allergies latex; ibuprofen; Tetracyclines; triazolam; aspartame; trazodone; phenylalanine Provider's Orders Plastic Surgery - ICD10: L97.822 [facility] plastics related to left knee wound. Hand Signature: Date(s): Electronic Signature(s) Signed: 10/17/2022 5:18:37 PM By: Deon Pilling RN, BSN Signed: 10/20/2022 10:15:01 AM By: Kalman Shan DO Previous Signature: 10/17/2022 3:32:17 PM Version By: Kalman Shan DO Entered By: Deon Pilling on 10/17/2022 17:08:23 [name] ([id number]) [id number] Page 4 of 10 --------------------------------------------------------------------------------  Problem List Details Patient Name: Date of Service: Katherinne, [place] 10/17/2022 1:15 PM Medical Record Number: [id number] Patient Account Number: [id number] Date of Birth/Sex: Treating RN: 12-30-1965 (56 y.o. F) Primary Care Provider: Emelia Loron Other Clinician: Referring Provider: Treating Provider/Extender: Inocente Salles in Treatment: 0 Active Problems ICD-10 Encounter Code Description Active Date MDM Diagnosis Z89.511 Acquired absence of right leg below knee 10/17/2022 No Yes E11.622 Type 2 diabetes mellitus with other skin ulcer 10/17/2022 No Yes L97.822 Non-pressure chronic ulcer of other part of left lower leg with fat layer 10/17/2022 No Yes exposed F31.9 Bipolar disorder, unspecified 10/17/2022 No Yes Inactive Problems Resolved Problems Electronic Signature(s) Signed: 10/17/2022 3:32:17 PM By: Kalman Shan DO Entered By: Kalman Shan on 10/17/2022 13:56:57 -------------------------------------------------------------------------------- Progress Note Details Patient  Name: Date of Service: [name]. 10/17/2022 1:15 PM Medical Record Number: [id number] Patient Account Number: [id number] Date of Birth/Sex: Treating RN: Aug 23, 1966 (56 y.o. F) Primary Care Provider: Emelia Loron Other Clinician: Referring Provider: Treating Provider/Extender: Inocente Salles in Treatment: 0 Subjective Chief Complaint Information obtained from Patient 10/17/2022; left knee wound History of Present Illness (HPI) The following HPI elements were documented for the patient's wound: Location: right plantar foot Quality: Patient reports No Pain. Severity: Patient states wound(s) are getting worse. KIARRAH, RAUSCH ([id number]) [id number] Page 5 of 10 Duration: Patient has had the wound for > 3 months prior to seeking treatment at the wound center Context: The wound would happen gradually Modifying Factors: Patient wound(s)/ulcer(s) are worsening due to : thickened skin in this area 56 year old patient who has diabetes mellitus and this is uncontrolled and her last hemoglobin A1c done was 8.3 on 11/09/2015. She is also had colon cancer recently. She also has chronic pain syndrome, gallbladder disease, and several other medical comorbidities. her past medical history significant for hypertension, GERD, insomnia, bipolar disorder, gastroparesis, chronic pain, anxiety neurosis, COPD, history of alcohol abuse, peripheral vascular disease workup with ABIs on the right 0.97 and left 0.94 in January 2015, sleep apnea, status post knee arthroscopy o2, status post foot surgery, status post anterior cervical discectomy, total abdominal hysterectomy, several cystoscopies, laparoscopic sigmoid colectomy in April 2016. She was a very heavy smoker before and has quit smoking tobacco and uses E cigarettes now. She does drink alcohol and does not admit to any recent illicit drug use. She says about a year ago she had a callus in a similar  spot and this caused a lot of discomfort and she had an open ulcer and this time around she wanted to come to the wound center before it got worse. no recent x-ray of the foot has been done. 10/17/2022 Ms. Gwendalynn Eckstrom is a 56 year old female with a past medical history of insulin dependent, uncontrolled type 2 diabetes with last hemoglobin A1c of 9, bipolar disorder, and right BKA that presents to the clinic for left knee wound. She was hospitalized on 08/31/2022 for cellulitis concerning for septic infrapatellar bursitis of the left knee. The ED provider attempted to aspirate the knee joint however no fluid was obtained. She had a wound culture that showed MRSA. She completed a course of doxycycline. With oral antibiotics her swelling and redness improved. However she had a wound that has gotten larger. She states that since she was discharged from the hospital she has been using a gel to the wound bed. It is unclear what the exact product is. She states she crawls around and uses a very thin knee pad to  the left knee to do so. She currently denies signs of infection. Patient History Information obtained from Patient. Allergies latex (Severity: Severe, Reaction: itchy rash), ibuprofen (Severity: Severe, Reaction: nausea, vomiting), Tetracyclines (Severity: Severe, Reaction: Nausea and vomiting), trazodone (Reaction: nausea and vomiting), triazolam (Severity: Severe, Reaction: hallucinations), aspartame (Severity: Severe, Reaction: Headaches, nausea and vomiting), phenylalanine (Reaction: headaches, nausea and vomiting.) Family History Cancer - Mother,Father, Diabetes - Mother,Siblings, Heart Disease - Siblings,Father, Hypertension - Father,Siblings, Kidney Disease - Father,Siblings, Lung Disease - Father, Tuberculosis - Paternal Grandparents, No family history of Hereditary Spherocytosis, Seizures, Stroke, Thyroid Problems. Social History Former smoker - quit 1 year ago, Marital Status -  Divorced, Alcohol Use - Never, Drug Use - No History, Caffeine Use - Daily. Medical History Eyes Denies history of Cataracts, Glaucoma, Optic Neuritis Ear/Nose/Mouth/Throat Denies history of Chronic sinus problems/congestion, Middle ear problems Hematologic/Lymphatic Denies history of Anemia, Hemophilia, Human Immunodeficiency Virus, Lymphedema, Sickle Cell Disease Respiratory Patient has history of Chronic Obstructive Pulmonary Disease (COPD) - 2 years, Sleep Apnea - sent cpap back didn't lke it. Denies history of Aspiration, Asthma, Pneumothorax, Tuberculosis Cardiovascular Patient has history of Coronary Artery Disease, Hypertension, Peripheral Arterial Disease Denies history of Angina, Arrhythmia, Congestive Heart Failure, Deep Vein Thrombosis, Hypotension, Myocardial Infarction, Peripheral Venous Disease, Phlebitis, Vasculitis Gastrointestinal Denies history of Cirrhosis , Colitis, Crohnoos, Hepatitis A, Hepatitis B, Hepatitis C Endocrine Patient has history of Type II Diabetes - 12/26/1994 Genitourinary Denies history of End Stage Renal Disease Immunological Denies history of Lupus Erythematosus, Raynaudoos, Scleroderma Integumentary (Skin) Denies history of History of Burn Musculoskeletal Patient has history of Gout, Osteoarthritis Denies history of Rheumatoid Arthritis, Osteomyelitis Neurologic Patient has history of Neuropathy Denies history of Dementia, Quadriplegia, Paraplegia, Seizure Disorder Oncologic Denies history of Received Chemotherapy, Received Radiation Psychiatric Patient has history of Confinement Anxiety Denies history of Anorexia/bulimia Hospitalization/Surgery History - Colon cancer. - stroke. - right BKA 2018. Medical A Surgical History Notes nd Constitutional Symptoms (General Health) stroke hypothyroidism Ear/Nose/Mouth/Throat Patient states her ears get stopped up alot Gastrointestinal Hx of colon cancer s/p surgery  04/24/2015 Genitourinary hx of 3 bladder surgeries with mesh. Patient states surgery was for over sized bladder. Incontience at times with urine. Musculoskeletal Patient states she has degenerative disc disease. Neurologic ALEXZANDRA, BILTON ([id number]) [id number] Page 6 of 10 HX of stroke Oncologic Hx colon cancer Psychiatric Anxiety and depression. Bipolar. Review of Systems (ROS) Constitutional Symptoms (General Health) Denies complaints or symptoms of Fatigue, Fever, Chills, Marked Weight Change. Eyes Complains or has symptoms of Glasses / Contacts. Denies complaints or symptoms of Dry Eyes, Vision Changes. Ear/Nose/Mouth/Throat Denies complaints or symptoms of Chronic sinus problems or rhinitis. Respiratory Denies complaints or symptoms of Chronic or frequent coughs, Shortness of Breath. Gastrointestinal Denies complaints or symptoms of Frequent diarrhea, Nausea, Vomiting. Genitourinary Denies complaints or symptoms of Frequent urination. Integumentary (Skin) left knee Psychiatric Denies complaints or symptoms of Claustrophobia. Objective Constitutional respirations regular, non-labored and within target range for patient.. Vitals Time Taken: 1:08 PM, Height: 65 in, Source: Stated, Weight: 210 lbs, Source: Stated, BMI: 34.9, Temperature: 98.6 F, Pulse: 82 bpm, Respiratory Rate: 18 breaths/min, Blood Pressure: 180/79 mmHg. Cardiovascular 2+ dorsalis pedis/posterior tibialis pulses. Psychiatric pleasant and cooperative. General Notes: Left lower extremity: T the knee there is an open wound with undermining. Tissue does not appear overly healthy. No signs of surrounding o infection. Integumentary (Hair, Skin) Wound #3 status is Open. Original cause of wound was Blister. The date acquired was: 03/28/2022. The wound is located on the Left,Distal  Knee. The wound measures 1.4cm length x 1cm width x 0.3cm depth; 1.1cm^2 area and 0.33cm^3 volume. There  is Fat Layer (Subcutaneous Tissue) exposed. There is no tunneling noted, however, there is undermining starting at 9:00 and ending at 12:00 with a maximum distance of 1.2cm. There is a medium amount of serosanguineous drainage noted. The wound margin is distinct with the outline attached to the wound base. There is medium (34-66%) pink granulation within the wound bed. There is a medium (34-66%) amount of necrotic tissue within the wound bed including Adherent Slough. The periwound skin appearance exhibited: Induration, Maceration. The periwound skin appearance did not exhibit: Callus, Crepitus, Excoriation, Rash, Scarring, Dry/Scaly, Atrophie Blanche, Cyanosis, Ecchymosis, Hemosiderin Staining, Mottled, Pallor, Rubor, Erythema. Periwound temperature was noted as No Abnormality. The periwound has tenderness on palpation. General Notes: boggy wound bed. Assessment Active Problems ICD-10 Acquired absence of right leg below knee Type 2 diabetes mellitus with other skin ulcer Non-pressure chronic ulcer of other part of left lower leg with fat layer exposed Bipolar disorder, unspecified Patient presents with a 1 month history of nonhealing ulcer to the left knee secondary to cellulitis Post needle aspiration attempt Complicated by uncontrolled type 2 diabetes. Septic arthritis was ruled out and she was treated with doxycycline due to a MRSA culture result. No signs of infection today. Unfortunately her wound has worsened due to the fact that she has to continuously put pressure on the site When she wants to get around. She has a right BKA. She knows this wound will not heal if she continues to put pressure on it. For now I recommended collagen dressing changes. She may benefit from a skin graft and I recommended referral to plastic surgery. She would likely benefit from being a facility post surgery. Follow-up in 2 weeks. Plan LYNSAY, FESPERMAN ([id number]) [id number] Page 7  of 10 Follow-up Appointments: Return Appointment in 2 weeks. - Dr. Heber [place] (any day) Anesthetic: (In clinic) Topical Lidocaine 5% applied to wound bed Bathing/ Shower/ Hygiene: May shower with protection but do not get wound dressing(s) wet. Edema Control - Lymphedema / SCD / Other: Elevate legs to the level of the heart or above for 30 minutes daily and/or when sitting, a frequency of: Avoid standing for long periods of time. Off-Loading: Other: - Keep pressure off of left knee Consults ordered were: Plastic Surgery The following medication(s) was prescribed: lidocaine topical 5 % gel gel topical for PRN debridements/pain was prescribed at facility WOUND #3: - Knee Wound Laterality: Left, Distal Cleanser: Anasept Antimicrobial Skin and Wound Cleanser, 8 (oz) (DME) (Generic) 1 x Per Day/15 Days Discharge Instructions: Cleanse the wound with Anasept cleanser prior to applying a clean dressing using gauze sponges, not tissue or cotton balls. Prim Dressing: Promogran Prisma Matrix, 4.34 (sq in) (silver collagen) (DME) (Generic) 1 x Per Day/15 Days ary Discharge Instructions: Moisten collagen with saline or hydrogel Secondary Dressing: Zetuvit Plus Silicone Border Dressing 5x5 (in/in) (DME) (Generic) 1 x Per Day/15 Days Discharge Instructions: Apply silicone border over primary dressing as directed. 1. Collagen 2. Referral to Dr. Erin Hearing, plastic surgery 3. Aggressive offloading 4. Follow-up in 2 weeks Electronic Signature(s) Signed: 10/17/2022 3:32:17 PM By: Kalman Shan DO Entered By: Kalman Shan on 10/17/2022 14:17:00 -------------------------------------------------------------------------------- HxROS Details Patient Name: Date of Service: [name], [name] 10/17/2022 1:15 PM Medical Record Number: [id number] Patient Account Number: [id number] Date of Birth/Sex: Treating RN: August 29, 1966 (56 y.o. Debby Bud Primary Care Provider: Emelia Loron Other  Clinician: Referring Provider: Treating Provider/Extender:  Emi Holes Weeks in Treatment: 0 Information Obtained From Patient Constitutional Symptoms (General Health) Complaints and Symptoms: Negative for: Fatigue; Fever; Chills; Marked Weight Change Medical History: Past Medical History Notes: stroke hypothyroidism Eyes Complaints and Symptoms: Positive for: Glasses / Contacts Negative for: Dry Eyes; Vision Changes Medical History: Negative for: Cataracts; Glaucoma; Optic Neuritis Ear/Nose/Mouth/Throat Complaints and Symptoms: Negative for: Chronic sinus problems or rhinitis Medical History: Negative for: Chronic sinus problems/congestion; Middle ear problems KEALA, DRUM ([id number]) [id number] Page 8 of 10 Past Medical History Notes: Patient states her ears get stopped up alot Respiratory Complaints and Symptoms: Negative for: Chronic or frequent coughs; Shortness of Breath Medical History: Positive for: Chronic Obstructive Pulmonary Disease (COPD) - 2 years; Sleep Apnea - sent cpap back didn't lke it. Negative for: Aspiration; Asthma; Pneumothorax; Tuberculosis Gastrointestinal Complaints and Symptoms: Negative for: Frequent diarrhea; Nausea; Vomiting Medical History: Negative for: Cirrhosis ; Colitis; Crohns; Hepatitis A; Hepatitis B; Hepatitis C Past Medical History Notes: Hx of colon cancer s/p surgery 04/24/2015 Genitourinary Complaints and Symptoms: Negative for: Frequent urination Medical History: Negative for: End Stage Renal Disease Past Medical History Notes: hx of 3 bladder surgeries with mesh. Patient states surgery was for over sized bladder. Incontience at times with urine. Psychiatric Complaints and Symptoms: Negative for: Claustrophobia Medical History: Positive for: Confinement Anxiety Negative for: Anorexia/bulimia Past Medical History Notes: Anxiety and depression.  Bipolar. Hematologic/Lymphatic Medical History: Negative for: Anemia; Hemophilia; Human Immunodeficiency Virus; Lymphedema; Sickle Cell Disease Cardiovascular Medical History: Positive for: Coronary Artery Disease; Hypertension; Peripheral Arterial Disease Negative for: Angina; Arrhythmia; Congestive Heart Failure; Deep Vein Thrombosis; Hypotension; Myocardial Infarction; Peripheral Venous Disease; Phlebitis; Vasculitis Endocrine Medical History: Positive for: Type II Diabetes - 12/26/1994 Time with diabetes: 30 yrs Treated with: Insulin Blood sugar tested every day: No Immunological Medical History: Negative for: Lupus Erythematosus; Raynauds; Scleroderma Integumentary (Skin) Complaints and Symptoms: Review of System Notes: left knee Medical History: Negative for: History of Burn Musculoskeletal Medical History: Positive for: Gout; Osteoarthritis Negative for: Rheumatoid Arthritis; Osteomyelitis MISK, [name] ([id number]) [id number] Page 9 of 10 Past Medical History Notes: Patient states she has degenerative disc disease. Neurologic Medical History: Positive for: Neuropathy Negative for: Dementia; Quadriplegia; Paraplegia; Seizure Disorder Past Medical History Notes: HX of stroke Oncologic Medical History: Negative for: Received Chemotherapy; Received Radiation Past Medical History Notes: Hx colon cancer Immunizations Pneumococcal Vaccine: Received Pneumococcal Vaccination: No Implantable Devices None Hospitalization / Surgery History Type of Hospitalization/Surgery Colon cancer stroke right BKA 2018 Family and Social History Cancer: Yes - Mother,Father; Diabetes: Yes - Mother,Siblings; Heart Disease: Yes - Siblings,Father; Hereditary Spherocytosis: No; Hypertension: Yes - Father,Siblings; Kidney Disease: Yes - Father,Siblings; Lung Disease: Yes - Father; Seizures: No; Stroke: No; Thyroid Problems: No; Tuberculosis: Yes - Paternal  Grandparents; Former smoker - quit 1 year ago; Marital Status - Divorced; Alcohol Use: Never; Drug Use: No History; Caffeine Use: Daily; Financial Concerns: No; Food, Clothing or Shelter Needs: No; Support System Lacking: No; Transportation Concerns: No Electronic Signature(s) Signed: 10/17/2022 3:32:17 PM By: Kalman Shan DO Signed: 10/17/2022 5:18:37 PM By: Deon Pilling RN, BSN Entered By: Deon Pilling on 10/17/2022 13:24:48 -------------------------------------------------------------------------------- SuperBill Details Patient Name: Date of Service: [name]. 10/17/2022 Medical Record Number: [id number] Patient Account Number: [id number] Date of Birth/Sex: Treating RN: Mar 30, 1966 (56 y.o. [name], [name] Primary Care Provider: Emelia Loron Other Clinician: Referring Provider: Treating Provider/Extender: Inocente Salles in Treatment: 0 Diagnosis Coding ICD-10 Codes Code Description [phone] Acquired absence of right leg below knee  E11.622 Type 2 diabetes mellitus with other skin ulcer F31.9 Bipolar disorder, unspecified L97.822 Non-pressure chronic ulcer of other part of left lower leg with fat layer exposed Facility Procedures Physician Procedures : CPT4 Code Description Modifier [id number] - WC PHYS LEVEL 4 - NEW PT ICD-10 Diagnosis Description Z89.511 Acquired absence of right leg below knee E11.622 Type 2 diabetes mellitus with other skin ulcer F31.9 Bipolar disorder, unspecified L97.822  Non-pressure chronic ulcer of other part of left lower leg with fat layer exposed Quantity: 1 Electronic Signature(s) Signed: 10/17/2022 3:32:17 PM By: Kalman Shan DO Entered By: Kalman Shan on 10/17/2022 14:17:11

## 2022-10-20 NOTE — Progress Notes (Signed)
GENAVIE, BOETTGER ([id number]) [id number] Page 1 of 10 Visit Report for 10/17/2022 Allergy List Details Patient Name: Date of Service: [name], [name] 10/17/2022 1:15 PM Medical Record Number: [id number] Patient Account Number: [id number] Date of Birth/Sex: Treating RN: [birth year] (55 y.o. [name], [name] Primary Care [name]: [name] Other Clinician: Referring [name]: Treating [name]/Extender: [name] in Treatment: 0 Allergies Active Allergies latex Reaction: itchy rash Severity: Severe Active: 07/22/2015 ibuprofen Reaction: nausea, vomiting Severity: Severe Active: 07/22/2015 Tetracyclines Reaction: Nausea and vomiting Severity: Severe Active: 07/22/2015 trazodone Reaction: nausea and vomiting Active: 07/22/2015 triazolam Reaction: hallucinations Severity: Severe Active: 07/22/2015 aspartame Reaction: Headaches, nausea and vomiting Severity: Severe Active: 07/22/2015 phenylalanine Reaction: headaches, nausea and vomiting. Active: 07/22/2015 Allergy Notes Electronic Signature(s) Signed: 10/17/2022 4:47:44 PM By: [name] Entered By: [name] on 10/17/2022 13:11:27 -------------------------------------------------------------------------------- Arrival Information Details Patient Name: Date of Service: [name] 10/17/2022 1:15 PM Medical Record Number: [id number] Patient Account Number: [id number] Date of Birth/Sex: Treating RN: [name], [name] (56 y.o. F) Primary Care [name]: [name] Other Clinician: [name], [name] ([id number]) [id number] Page 2 of 10 Referring [name]: Treating [name]/Extender: [name] in Treatment: 0 Visit Information Patient Arrived: Ambulatory Arrival Time: 13:07 Accompanied By: self Transfer Assistance: None Patient Identification Verified: Yes Secondary Verification Process Completed: Yes Patient  Requires Transmission-Based Precautions: No Patient Has Alerts: No History Since Last Visit Added or deleted any medications: No Any new allergies or adverse reactions: No Had a fall or experienced change in activities of daily living that [name] affect risk of falls: No Signs or symptoms of abuse/neglect since last visito No Hospitalized since last visit: No Implantable device outside of the clinic excluding cellular tissue based products placed in the center since last visit: No Pain Present Now: Yes Electronic Signature(s) Signed: 10/17/2022 4:47:44 PM By: [name] Entered By: [name] on 10/17/2022 13:08:24 -------------------------------------------------------------------------------- Clinic Level of Care Assessment Details Patient Name: Date of Service: [name] 10/17/2022 1:15 PM Medical Record Number: [id number] Patient Account Number: [id number] Date of Birth/Sex: Treating RN: [name], [name] (56 y.o. [name], [name] Primary Care [name]: [name] Other Clinician: Referring Keyri Salberg: Treating [name]/Extender: [name] in Treatment: 0 Clinic Level of Care Assessment Items TOOL 4 Quantity Score X- 1 0 Use when only an EandM is performed on FOLLOW-UP visit ASSESSMENTS - Nursing Assessment / Reassessment X- 1 10 Reassessment of Co-morbidities (includes updates in patient status) X- 1 5 Reassessment of Adherence to Treatment Plan ASSESSMENTS - Wound and Skin A ssessment / Reassessment X - Simple Wound Assessment / Reassessment - one wound 1 5 '[]'$  - 0 Complex Wound Assessment / Reassessment - multiple wounds '[]'$  - 0 Dermatologic / Skin Assessment (not related to wound area) ASSESSMENTS - Focused Assessment '[]'$  - 0 Circumferential Edema Measurements - multi extremities '[]'$  - 0 Nutritional Assessment / Counseling / Intervention '[]'$  - 0 Lower Extremity Assessment (monofilament, tuning fork, pulses) '[]'$  - 0 Peripheral Arterial  Disease Assessment (using hand held doppler) ASSESSMENTS - Ostomy and/or Continence Assessment and Care '[]'$  - 0 Incontinence Assessment and Management '[]'$  - 0 Ostomy Care Assessment and Management (repouching, etc.) PROCESS - Coordination of Care X - Simple Patient / Family Education for ongoing care 1 15 '[]'$  - 0 Complex (extensive) Patient / Family Education for ongoing care X- 1 10 Staff obtains Programmer, systems, Records, T Results / Process Orders est '[]'$  - 0 Staff telephones HHA, Nursing Homes / Clarify orders / etc [name] (  [id number]) [id number] Page 3 of 10 '[]'$  - 0 Routine Transfer to another Facility (non-emergent condition) '[]'$  - 0 Routine Hospital Admission (non-emergent condition) X- 1 15 New Admissions / Biomedical engineer / Ordering NPWT Apligraf, etc. , '[]'$  - 0 Emergency Hospital Admission (emergent condition) X- 1 10 Simple Discharge Coordination '[]'$  - 0 Complex (extensive) Discharge Coordination PROCESS - Special Needs '[]'$  - 0 Pediatric / Minor Patient Management '[]'$  - 0 Isolation Patient Management '[]'$  - 0 Hearing / Language / Visual special needs '[]'$  - 0 Assessment of Community assistance (transportation, D/C planning, etc.) '[]'$  - 0 Additional assistance / Altered mentation '[]'$  - 0 Support Surface(s) Assessment (bed, cushion, seat, etc.) INTERVENTIONS - Wound Cleansing / Measurement X - Simple Wound Cleansing - one wound 1 5 '[]'$  - 0 Complex Wound Cleansing - multiple wounds X- 1 5 Wound Imaging (photographs - any number of wounds) '[]'$  - 0 Wound Tracing (instead of photographs) X- 1 5 Simple Wound Measurement - one wound '[]'$  - 0 Complex Wound Measurement - multiple wounds INTERVENTIONS - Wound Dressings X - Small Wound Dressing one or multiple wounds 1 10 '[]'$  - 0 Medium Wound Dressing one or multiple wounds '[]'$  - 0 Large Wound Dressing one or multiple wounds X- 1 5 Application of Medications - topical '[]'$  - 0 Application of  Medications - injection INTERVENTIONS - Miscellaneous '[]'$  - 0 External ear exam '[]'$  - 0 Specimen Collection (cultures, biopsies, blood, body fluids, etc.) '[]'$  - 0 Specimen(s) / Culture(s) sent or taken to Lab for analysis '[]'$  - 0 Patient Transfer (multiple staff / Civil Service fast streamer / Similar devices) '[]'$  - 0 Simple Staple / Suture removal (25 or less) '[]'$  - 0 Complex Staple / Suture removal (26 or more) '[]'$  - 0 Hypo / Hyperglycemic Management (close monitor of Blood Glucose) '[]'$  - 0 Ankle / Brachial Index (ABI) - do not check if billed separately X- 1 5 Vital Signs Has the patient been seen at the hospital within the last three years: Yes Total Score: 105 Level Of Care: New/Established - Level 3 Electronic Signature(s) Signed: 10/20/2022 4:44:37 PM By: Rhae Hammock RN Entered By: Rhae Hammock on 10/17/2022 13:59:15 [name] ([id number]) [id number] Page 4 of 10 -------------------------------------------------------------------------------- Encounter Discharge Information Details Patient Name: Date of Service: Hebah, Bogosian 10/17/2022 1:15 PM Medical Record Number: [id number] Patient Account Number: [id number] Date of Birth/Sex: Treating RN: [birth year] (56 y.o. [name], [name] Primary Care Chyanna Flock: [name] Other Clinician: Referring Jazzalynn Rhudy: Treating Anaiya Wisinski/Extender: [name] in Treatment: 0 Encounter Discharge Information Items Discharge Condition: Stable Ambulatory Status: Ambulatory Discharge Destination: Home Transportation: Private Auto Accompanied By: daughter Schedule Follow-up Appointment: Yes Clinical Summary of Care: Patient Declined Electronic Signature(s) Signed: 10/20/2022 4:44:37 PM By: Rhae Hammock RN Entered By: Rhae Hammock on 10/17/2022 13:59:48 -------------------------------------------------------------------------------- Lower Extremity Assessment Details Patient  Name: Date of Service: [name], [name] 10/17/2022 1:15 PM Medical Record Number: [id number] Patient Account Number: [id number] Date of Birth/Sex: Treating RN: [birth year] (56 y.o. [name] Primary Care Rhesa Forsberg: [name] Other Clinician: Referring Keaden Gunnoe: Treating Cylas Falzone/Extender: [name] in Treatment: 0 Electronic Signature(s) Signed: 10/17/2022 5:18:37 PM By: Deon Pilling RN, BSN Entered By: Deon Pilling on 10/17/2022 13:31:32 -------------------------------------------------------------------------------- Multi Wound Chart Details Patient Name: Date of Service: Arianni, Gallego 10/17/2022 1:15 PM Medical Record Number: [id number] Patient Account Number: [id number] Date of Birth/Sex: Treating RN: [name] (56 y.o. F) Primary Care Dawn Convery: [name] Other Clinician: Referring Harlon Kutner: Treating Makesha Belitz/Extender: Elaina Pattee,  Carie Caddy in Treatment: 0 Vital Signs Height(in): 65 Pulse(bpm): 82 Weight(lbs): 210 Blood Pressure(mmHg): 180/79 Body Mass Index(BMI): 34.9 Temperature(F): 98.6 Respiratory Rate(breaths/min): [address] ([id number]) [id number] Page 5 of 10 [3:Photos:] [N/A:N/A] Left, Distal Knee N/A N/A Wound Location: Blister N/A N/A Wounding Event: Diabetic Wound/Ulcer of the Lower N/A N/A Primary Etiology: Extremity Abscess N/A N/A Secondary Etiology: Chronic Obstructive Pulmonary N/A N/A Comorbid History: Disease (COPD), Sleep Apnea, Coronary Artery Disease, Hypertension, Peripheral Arterial Disease, Type II Diabetes, Gout, Osteoarthritis, Neuropathy, Confinement Anxiety 03/28/2022 N/A N/A Date Acquired: 0 N/A N/A Weeks of Treatment: Open N/A N/A Wound Status: No N/A N/A Wound Recurrence: 1.4x1x0.3 N/A N/A Measurements L x W x D (cm) 1.1 N/A N/A A (cm) : rea 0.33 N/A N/A Volume (cm) : 9 Starting Position 1 (o'clock): 12 Ending Position 1  (o'clock): 1.2 Maximum Distance 1 (cm): Yes N/A N/A Undermining: Grade 2 N/A N/A Classification: Medium N/A N/A Exudate A mount: Serosanguineous N/A N/A Exudate Type: red, brown N/A N/A Exudate Color: Distinct, outline attached N/A N/A Wound Margin: Medium (34-66%) N/A N/A Granulation A mount: Pink N/A N/A Granulation Quality: Medium (34-66%) N/A N/A Necrotic A mount: Fat Layer (Subcutaneous Tissue): Yes N/A N/A Exposed Structures: Fascia: No Tendon: No Muscle: No Joint: No Bone: No None N/A N/A Epithelialization: Induration: Yes N/A N/A Periwound Skin Texture: Excoriation: No Callus: No Crepitus: No Rash: No Scarring: No Maceration: Yes N/A N/A Periwound Skin Moisture: Dry/Scaly: No Atrophie Blanche: No N/A N/A Periwound Skin Color: Cyanosis: No Ecchymosis: No Erythema: No Hemosiderin Staining: No Mottled: No Pallor: No Rubor: No No Abnormality N/A N/A Temperature: Yes N/A N/A Tenderness on Palpation: boggy wound bed. N/A N/A Assessment Notes: Treatment Notes Electronic Signature(s) Signed: 10/17/2022 3:32:17 PM By: Kalman Shan DO Entered By: Kalman Shan on 10/17/2022 13:57:02 [name] ([id number]) [id number] Page 6 of 10 -------------------------------------------------------------------------------- Multi-Disciplinary Care Plan Details Patient Name: Date of Service: Itzamara, Casas 10/17/2022 1:15 PM Medical Record Number: [id number] Patient Account Number: [id number] Date of Birth/Sex: Treating RN: [name] (56 y.o. [name], [name] Primary Care Denajah Farias: [name] Other Clinician: Referring Kasara Schomer: Treating Nathanel Tallman/Extender: [name] in Treatment: 0 Active Inactive Orientation to the Wound Care Program Nursing Diagnoses: Knowledge deficit related to the wound healing center program Goals: Patient/caregiver will verbalize understanding of the [facility] Date Initiated: 10/17/2022 Target Resolution Date: 10/29/2022 Goal Status: Active Interventions: Provide education on orientation to the wound center Notes: Wound/Skin Impairment Nursing Diagnoses: Impaired tissue integrity Knowledge deficit related to ulceration/compromised skin integrity Goals: Patient will have a decrease in wound volume by X% from date: (specify in notes) Date Initiated: 10/17/2022 Target Resolution Date: 10/28/2022 Goal Status: Active Patient/caregiver will verbalize understanding of skin care regimen Date Initiated: 10/17/2022 Target Resolution Date: 10/28/2022 Goal Status: Active Ulcer/skin breakdown will have a volume reduction of 30% by week 4 Date Initiated: 10/17/2022 Target Resolution Date: 10/27/2022 Goal Status: Active Interventions: Assess patient/caregiver ability to obtain necessary supplies Assess patient/caregiver ability to perform ulcer/skin care regimen upon admission and as needed Assess ulceration(s) every visit Notes: Electronic Signature(s) Signed: 10/20/2022 4:44:37 PM By: Rhae Hammock RN Entered By: Rhae Hammock on 10/17/2022 13:49:[name] -------------------------------------------------------------------------------- Pain Assessment Details Patient Name: Date of Service: [name], [name] 10/17/2022 1:15 PM Medical Record Number: [id number] Patient Account Number: [id number] Date of Birth/Sex: Treating RN: [birth year] (55 y.o. [name] Primary Care Horrace Hanak: [name] Other Clinician: TONIMARIE, GRITZ ([id number]) [id number] Page 7 of 10 Referring Brynnly Bonet: Treating  Illyana Schorsch/Extender: [name] in Treatment: 0 Active Problems Location of Pain Severity and Description of Pain Patient Has Paino Yes Site Locations Pain Location: Pain in Ulcers Rate the pain. Current Pain Level: 4 Pain Management and Medication Current Pain Management: Medication:  No Cold Application: No Rest: No Massage: No Activity: No T.E.N.S.: No Heat Application: No Leg drop or elevation: No Is the Current Pain Management Adequate: Adequate How does your wound impact your activities of daily livingo Sleep: No Bathing: No Appetite: No Relationship With Others: No Bladder Continence: No Emotions: No Bowel Continence: No Work: No Toileting: No Drive: No Dressing: No Hobbies: No Notes left knee pain Electronic Signature(s) Signed: 10/17/2022 5:18:37 PM By: Deon Pilling RN, BSN Entered By: Deon Pilling on 10/17/2022 13:24:25 -------------------------------------------------------------------------------- Patient/Caregiver Education Details Patient Name: Date of Service: [name] 10/23/2023andnbsp1:15 PM Medical Record Number: [id number] Patient Account Number: [id number] Date of Birth/Gender: Treating RN: [birth year] (56 y.o. [name], [name] Primary Care Physician: [name] Other Clinician: Referring Physician: Treating Physician/Extender: [name] in Treatment: 0 Education Assessment Education Provided To: Patient Education Topics Provided Evart: FLOWER, FRANKO [place] ([id number]) [id number] Page 8 of 10 Methods: Explain/Verbal Responses: Reinforcements needed, State content correctly Electronic Signature(s) Signed: 10/20/2022 4:44:37 PM By: Rhae Hammock RN Entered By: Rhae Hammock on 10/17/2022 13:49:22 -------------------------------------------------------------------------------- Wound Assessment Details Patient Name: Date of Service: Newell, Frater 10/17/2022 1:15 PM Medical Record Number: [id number] Patient Account Number: [id number] Date of Birth/Sex: Treating RN: [name] (56 y.o. [name], Meta.Reding Primary Care Zacary Bauer: [name] Other Clinician: Referring Dasie Chancellor: Treating Kateland Leisinger/Extender: [name] in Treatment: 0 Wound Status Wound Number: 3 Primary Diabetic Wound/Ulcer of the Lower Extremity Etiology: Wound Location: Left, Distal Knee Secondary Abscess Wounding Event: Blister Etiology: Date Acquired: 03/28/2022 Wound Open Weeks Of Treatment: 0 Status: Clustered Wound: No Comorbid Chronic Obstructive Pulmonary Disease (COPD), Sleep Apnea, History: Coronary Artery Disease, Hypertension, Peripheral Arterial Disease, Type II Diabetes, Gout, Osteoarthritis, Neuropathy, Confinement Anxiety Photos Wound Measurements Length: (cm) 1.4 Width: (cm) 1 Depth: (cm) 0.3 Area: (cm) 1.1 Volume: (cm) 0.33 % Reduction in Area: % Reduction in Volume: Epithelialization: None Tunneling: No Undermining: Yes Starting Position (o'clock): 9 Ending Position (o'clock): 12 Maximum Distance: (cm) 1.2 Wound Description Classification: Grade 2 Wound Margin: Distinct, outline attached Exudate Amount: Medium Exudate Type: Serosanguineous Exudate Color: red, brown Foul Odor After Cleansing: No Slough/Fibrino Yes Wound Bed Granulation Amount: Medium (34-66%) Exposed Structure Granulation Quality: Pink Fascia Exposed: No Necrotic Amount: Medium (34-66%) Fat Layer (Subcutaneous Tissue) Exposed: Yes Necrotic Quality: Adherent Slough Tendon Exposed: No [name], [name] ([id number]) [id number] Page 9 of 10 Muscle Exposed: No Joint Exposed: No Bone Exposed: No Periwound Skin Texture Texture Color No Abnormalities Noted: No No Abnormalities Noted: No Callus: No Atrophie Blanche: No Crepitus: No Cyanosis: No Excoriation: No Ecchymosis: No Induration: Yes Erythema: No Rash: No Hemosiderin Staining: No Scarring: No Mottled: No Pallor: No Moisture Rubor: No No Abnormalities Noted: No Dry / Scaly: No Temperature / Pain Maceration: Yes Temperature: No Abnormality Tenderness on Palpation: Yes Assessment Notes boggy wound bed. Treatment  Notes Wound #3 (Knee) Wound Laterality: Left, Distal Cleanser Anasept Antimicrobial Skin and Wound Cleanser, 8 (oz) Discharge Instruction: Cleanse the wound with Anasept cleanser prior to applying a clean dressing using gauze sponges, not tissue or cotton balls. Peri-Wound Care Topical Primary Dressing Promogran Prisma Matrix, 4.34 (sq in) (silver collagen) Discharge Instruction: Moisten collagen with saline or  hydrogel Secondary Dressing Zetuvit Plus Silicone Border Dressing 5x5 (in/in) Discharge Instruction: Apply silicone border over primary dressing as directed. Secured With Compression Wrap Compression Stockings Environmental education officer) Signed: 10/17/2022 5:18:37 PM By: Deon Pilling RN, BSN Entered By: Deon Pilling on 10/17/2022 13:31:24 -------------------------------------------------------------------------------- Vitals Details Patient Name: Date of Service: [name]. 10/17/2022 1:15 PM Medical Record Number: [id number] Patient Account Number: [id number] Date of Birth/Sex: Treating RN: [birth year] (56 y.o. F) Primary Care Eulice Rutledge: [name] Other Clinician: Referring Evoleht Hovatter: Treating Bernis Stecher/Extender: [name] in Treatment: 0 Vital Signs Time Taken: 13:08 Temperature (F): 98.6 Height (in): 65 Pulse (bpm): 82 Source: Stated Respiratory Rate (breaths/min): [address] ([id number]) [id number] Page 10 of 10 Source: Stated Respiratory Rate (breaths/min): 18 Weight (lbs): 210 Blood Pressure (mmHg): 180/79 Source: Stated Reference Range: 80 - 120 mg / dl Body Mass Index (BMI): 34.9 Electronic Signature(s) Signed: 10/17/2022 4:47:44 PM By: [name] Entered By: [name] on 10/17/2022 13:13:35

## 2022-10-24 NOTE — Progress Notes (Signed)
error 

## 2022-10-27 ENCOUNTER — Telehealth: Payer: Self-pay

## 2022-10-27 NOTE — Telephone Encounter (Signed)
ADS faxed over request for most recent office notes within last 6 months. Patient has not been seen in office since Feb 2023 and has appt for December

## 2022-11-03 ENCOUNTER — Encounter (HOSPITAL_BASED_OUTPATIENT_CLINIC_OR_DEPARTMENT_OTHER): Payer: 59 | Attending: Internal Medicine | Admitting: Internal Medicine

## 2022-11-03 DIAGNOSIS — Z9049 Acquired absence of other specified parts of digestive tract: Secondary | ICD-10-CM | POA: Insufficient documentation

## 2022-11-03 DIAGNOSIS — Z85038 Personal history of other malignant neoplasm of large intestine: Secondary | ICD-10-CM | POA: Diagnosis not present

## 2022-11-03 DIAGNOSIS — Z89511 Acquired absence of right leg below knee: Secondary | ICD-10-CM | POA: Diagnosis not present

## 2022-11-03 DIAGNOSIS — Z794 Long term (current) use of insulin: Secondary | ICD-10-CM | POA: Insufficient documentation

## 2022-11-03 DIAGNOSIS — K3184 Gastroparesis: Secondary | ICD-10-CM | POA: Insufficient documentation

## 2022-11-03 DIAGNOSIS — E11622 Type 2 diabetes mellitus with other skin ulcer: Secondary | ICD-10-CM | POA: Diagnosis present

## 2022-11-03 DIAGNOSIS — F319 Bipolar disorder, unspecified: Secondary | ICD-10-CM | POA: Insufficient documentation

## 2022-11-03 DIAGNOSIS — Z8614 Personal history of Methicillin resistant Staphylococcus aureus infection: Secondary | ICD-10-CM | POA: Diagnosis not present

## 2022-11-03 DIAGNOSIS — L97822 Non-pressure chronic ulcer of other part of left lower leg with fat layer exposed: Secondary | ICD-10-CM | POA: Insufficient documentation

## 2022-11-03 DIAGNOSIS — G4730 Sleep apnea, unspecified: Secondary | ICD-10-CM | POA: Insufficient documentation

## 2022-11-03 DIAGNOSIS — E1151 Type 2 diabetes mellitus with diabetic peripheral angiopathy without gangrene: Secondary | ICD-10-CM | POA: Insufficient documentation

## 2022-11-03 DIAGNOSIS — J449 Chronic obstructive pulmonary disease, unspecified: Secondary | ICD-10-CM | POA: Diagnosis not present

## 2022-11-03 DIAGNOSIS — I10 Essential (primary) hypertension: Secondary | ICD-10-CM | POA: Insufficient documentation

## 2022-11-03 DIAGNOSIS — E1143 Type 2 diabetes mellitus with diabetic autonomic (poly)neuropathy: Secondary | ICD-10-CM | POA: Insufficient documentation

## 2022-11-03 NOTE — Progress Notes (Signed)
RUSSIE, GULLEDGE ([id number]) [id number] Page 1 of 8 Visit Report for 11/03/2022 Chief Complaint Document Details Patient Name: Date of Service: [name]. 11/03/2022 1:30 PM Medical Record Number: [id number] Patient Account Number: [id number] Date of Birth/Sex: Treating RN: 1966/11/05 (56 y.o. F) Primary Care Provider: Emelia Loron Other Clinician: Referring Provider: Treating Provider/Extender: Inocente Salles in Treatment: 2 Information Obtained from: Patient Chief Complaint 10/17/2022; left knee wound Electronic Signature(s) Signed: 11/03/2022 4:44:16 PM By: Kalman Shan DO Entered By: Kalman Shan on 11/03/2022 15:52:16 -------------------------------------------------------------------------------- HPI Details Patient Name: Date of Service: [name], [place]. 11/03/2022 1:30 PM Medical Record Number: [id number] Patient Account Number: [id number] Date of Birth/Sex: Treating RN: December 24, 1966 (56 y.o. F) Primary Care Provider: Emelia Loron Other Clinician: Referring Provider: Treating Provider/Extender: Inocente Salles in Treatment: 2 History of Present Illness Location: right plantar foot Quality: Patient reports No Pain. Severity: Patient states wound(s) are getting worse. Duration: Patient has had the wound for > 3 months prior to seeking treatment at the wound center Context: The wound would happen gradually Modifying Factors: Patient wound(s)/ulcer(s) are worsening due to : thickened skin in this area HPI Description: 56 year old patient who has diabetes mellitus and this is uncontrolled and her last hemoglobin A1c done was 8.3 on 11/09/2015. She is also had colon cancer recently. She also has chronic pain syndrome, gallbladder disease, and several other medical comorbidities. her past medical history significant for hypertension, GERD, insomnia, bipolar disorder, gastroparesis, chronic pain,  anxiety neurosis, COPD, history of alcohol abuse, peripheral vascular disease workup with ABIs on the right 0.97 and left 0.94 in January 2015, sleep apnea, status post knee arthroscopy 2, status post foot surgery, status post anterior cervical discectomy, total abdominal hysterectomy, several cystoscopies, laparoscopic sigmoid colectomy in April 2016. She was a very heavy smoker before and has quit smoking tobacco and uses E cigarettes now. She does drink alcohol and does not admit to any recent illicit drug use. She says about a year ago she had a callus in a similar spot and this caused a lot of discomfort and she had an open ulcer and this time around she wanted to come to the wound center before it got worse. no recent x-ray of the foot has been done. 10/17/2022 [name] is a 56 year old female with a past medical history of insulin dependent, uncontrolled type 2 diabetes with last hemoglobin A1c of 9, bipolar disorder, and right BKA that presents to the clinic for left knee wound. She was hospitalized on 08/31/2022 for cellulitis concerning for septic infrapatellar bursitis of the left knee. The ED provider attempted to aspirate the knee joint however no fluid was obtained. She had a wound culture that showed MRSA. She completed a course of doxycycline. With oral antibiotics her swelling and redness improved. However she had a wound that has gotten larger. She states that since she was discharged from the hospital she has been using a gel to the wound bed. It is unclear what the exact product is. She states she crawls around and uses a very thin knee pad to the left knee to do so. She currently denies signs of infection. 11/9; patient presents for follow-up. She has been using collagen to the wound bed. She is scheduled to see plastic surgery on 11/28 T discuss potential skin o grafting for her wound. Patient states she would like home health to help with dressing changes. We will try  and order this. She states  she is not able to offload the area and still crawls to get around. ARABELL, NERIA ([id number]) [id number] Page 2 of 8 Electronic Signature(s) Signed: 11/03/2022 4:44:16 PM By: Kalman Shan DO Entered By: Kalman Shan on 11/03/2022 15:56:25 -------------------------------------------------------------------------------- Physical Exam Details Patient Name: Date of Service: [name] M. 11/03/2022 1:30 PM Medical Record Number: [id number] Patient Account Number: [id number] Date of Birth/Sex: Treating RN: Jun 21, 1966 (56 y.o. F) Primary Care Provider: Emelia Loron Other Clinician: Referring Provider: Treating Provider/Extender: Inocente Salles in Treatment: 2 Constitutional respirations regular, non-labored and within target range for patient.. Cardiovascular 2+ dorsalis pedis/posterior tibialis pulses. Psychiatric pleasant and cooperative. Notes Left lower extremity: T the knee there is an open wound with undermining. There is granulation tissue but overall does not appear healthy. No signs of o surrounding infection. 2+ pitting edema to the knee. Electronic Signature(s) Signed: 11/03/2022 4:44:16 PM By: Kalman Shan DO Entered By: Kalman Shan on 11/03/2022 15:57:30 -------------------------------------------------------------------------------- Physician Orders Details Patient Name: Date of Service: [name], [name]. 11/03/2022 1:30 PM Medical Record Number: [id number] Patient Account Number: [id number] Date of Birth/Sex: Treating RN: [birth year] (56 y.o. [name], [name] Primary Care Provider: Emelia Loron Other Clinician: Referring Provider: Treating Provider/Extender: Inocente Salles in Treatment: 2 Verbal / Phone Orders: No Diagnosis Coding Follow-up Appointments Return appointment in 1 month. - w/ Dr. Heber [place] Anesthetic (In clinic) Topical Lidocaine 5%  applied to wound bed Bathing/ Shower/ Hygiene May shower with protection but do not get wound dressing(s) wet. Edema Control - Lymphedema / SCD / Other Elevate legs to the level of the heart or above for 30 minutes daily and/or when sitting, a frequency of: Avoid standing for long periods of time. Off-Loading Other: - Keep pressure off of left knee [address] SHAMONICA, SCHADT [place] ([id number]) [id number] Page 3 of 8 dmit to [place] for wound care. May utilize formulary equivalent dressing for wound treatment orders unless otherwise specified. - [facility]- to change dressing 2-3 x a week Wound Treatment Wound #3 - Knee Wound Laterality: Left, Distal Cleanser: Anasept Antimicrobial Skin and Wound Cleanser, 8 (oz) (Generic) 1 x Per Day/15 Days Discharge Instructions: Cleanse the wound with Anasept cleanser prior to applying a clean dressing using gauze sponges, not tissue or cotton balls. Prim Dressing: Promogran Prisma Matrix, 4.34 (sq in) (silver collagen) (Generic) 1 x Per Day/15 Days ary Discharge Instructions: Moisten collagen with saline or hydrogel Secondary Dressing: Zetuvit Plus Silicone Border Dressing 5x5 (in/in) (Generic) 1 x Per Day/15 Days Discharge Instructions: Apply silicone border over primary dressing as directed. Electronic Signature(s) Signed: 11/03/2022 4:44:16 PM By: Kalman Shan DO Entered By: Kalman Shan on 11/03/2022 15:57:40 -------------------------------------------------------------------------------- Problem List Details Patient Name: Date of Service: [name], [place]. 11/03/2022 1:30 PM Medical Record Number: [id number] Patient Account Number: [id number] Date of Birth/Sex: Treating RN: 07/30/1966 (56 y.o. F) Primary Care Provider: Emelia Loron Other Clinician: Referring Provider: Treating Provider/Extender: Inocente Salles in Treatment: 2 Active Problems ICD-10 Encounter Code Description Active  Date MDM Diagnosis Z89.511 Acquired absence of right leg below knee 10/17/2022 No Yes E11.622 Type 2 diabetes mellitus with other skin ulcer 10/17/2022 No Yes L97.822 Non-pressure chronic ulcer of other part of left lower leg with fat layer 10/17/2022 No Yes exposed F31.9 Bipolar disorder, unspecified 10/17/2022 No Yes Inactive Problems Resolved Problems Electronic Signature(s) Signed: 11/03/2022 4:44:16 PM By: Kalman Shan DO Entered By: Kalman Shan on 11/03/2022 15:54:59 [name] ([id number]) [id number]  Page 4 of 8 -------------------------------------------------------------------------------- Progress Note Details Patient Name: Date of Service: [name]. 11/03/2022 1:30 PM Medical Record Number: [id number] Patient Account Number: [id number] Date of Birth/Sex: Treating RN: Jan 28, 1966 (56 y.o. F) Primary Care Provider: Emelia Loron Other Clinician: Referring Provider: Treating Provider/Extender: Inocente Salles in Treatment: 2 Subjective Chief Complaint Information obtained from Patient 10/17/2022; left knee wound History of Present Illness (HPI) The following HPI elements were documented for the patient's wound: Location: right plantar foot Quality: Patient reports No Pain. Severity: Patient states wound(s) are getting worse. Duration: Patient has had the wound for > 3 months prior to seeking treatment at the wound center Context: The wound would happen gradually Modifying Factors: Patient wound(s)/ulcer(s) are worsening due to : thickened skin in this area 56 year old patient who has diabetes mellitus and this is uncontrolled and her last hemoglobin A1c done was 8.3 on 11/09/2015. She is also had colon cancer recently. She also has chronic pain syndrome, gallbladder disease, and several other medical comorbidities. her past medical history significant for hypertension, GERD, insomnia, bipolar disorder,  gastroparesis, chronic pain, anxiety neurosis, COPD, history of alcohol abuse, peripheral vascular disease workup with ABIs on the right 0.97 and left 0.94 in January 2015, sleep apnea, status post knee arthroscopy o2, status post foot surgery, status post anterior cervical discectomy, total abdominal hysterectomy, several cystoscopies, laparoscopic sigmoid colectomy in April 2016. She was a very heavy smoker before and has quit smoking tobacco and uses E cigarettes now. She does drink alcohol and does not admit to any recent illicit drug use. She says about a year ago she had a callus in a similar spot and this caused a lot of discomfort and she had an open ulcer and this time around she wanted to come to the wound center before it got worse. no recent x-ray of the foot has been done. 10/17/2022 [name] is a 56 year old female with a past medical history of insulin dependent, uncontrolled type 2 diabetes with last hemoglobin A1c of 9, bipolar disorder, and right BKA that presents to the clinic for left knee wound. She was hospitalized on 08/31/2022 for cellulitis concerning for septic infrapatellar bursitis of the left knee. The ED provider attempted to aspirate the knee joint however no fluid was obtained. She had a wound culture that showed MRSA. She completed a course of doxycycline. With oral antibiotics her swelling and redness improved. However she had a wound that has gotten larger. She states that since she was discharged from the hospital she has been using a gel to the wound bed. It is unclear what the exact product is. She states she crawls around and uses a very thin knee pad to the left knee to do so. She currently denies signs of infection. 11/9; patient presents for follow-up. She has been using collagen to the wound bed. She is scheduled to see plastic surgery on 11/28 T discuss potential skin o grafting for her wound. Patient states she would like home health to help with  dressing changes. We will try and order this. She states she is not able to offload the area and still crawls to get around. Patient History Information obtained from Patient. Family History Cancer - Mother,Father, Diabetes - Mother,Siblings, Heart Disease - Siblings,Father, Hypertension - Father,Siblings, Kidney Disease - Father,Siblings, Lung Disease - Father, Tuberculosis - Paternal Grandparents, No family history of Hereditary Spherocytosis, Seizures, Stroke, Thyroid Problems. Social History Former smoker - quit 1 year ago, Marital Status -  Divorced, Alcohol Use - Never, Drug Use - No History, Caffeine Use - Daily. Medical History Eyes Denies history of Cataracts, Glaucoma, Optic Neuritis Ear/Nose/Mouth/Throat Denies history of Chronic sinus problems/congestion, Middle ear problems Hematologic/Lymphatic Denies history of Anemia, Hemophilia, Human Immunodeficiency Virus, Lymphedema, Sickle Cell Disease Respiratory Patient has history of Chronic Obstructive Pulmonary Disease (COPD) - 2 years, Sleep Apnea - sent cpap back didn't lke it. Denies history of Aspiration, Asthma, Pneumothorax, Tuberculosis Cardiovascular Patient has history of Coronary Artery Disease, Hypertension, Peripheral Arterial Disease Denies history of Angina, Arrhythmia, Congestive Heart Failure, Deep Vein Thrombosis, Hypotension, Myocardial Infarction, Peripheral Venous Disease, Phlebitis, Vasculitis Gastrointestinal Denies history of Cirrhosis , Colitis, Crohnoos, Hepatitis A, Hepatitis B, Hepatitis C Endocrine Patient has history of Type II Diabetes - 12/26/1994 Genitourinary Denies history of End Stage Renal Disease Immunological PHILANA, YOUNIS ([id number]) [id number] Page 5 of 8 Denies history of Lupus Erythematosus, Raynaudoos, Scleroderma Integumentary (Skin) Denies history of History of Burn Musculoskeletal Patient has history of Gout, Osteoarthritis Denies history of  Rheumatoid Arthritis, Osteomyelitis Neurologic Patient has history of Neuropathy Denies history of Dementia, Quadriplegia, Paraplegia, Seizure Disorder Oncologic Denies history of Received Chemotherapy, Received Radiation Psychiatric Patient has history of Confinement Anxiety Denies history of Anorexia/bulimia Hospitalization/Surgery History - Colon cancer. - stroke. - right BKA 2018. Medical A Surgical History Notes nd Constitutional Symptoms (General Health) stroke hypothyroidism Ear/Nose/Mouth/Throat Patient states her ears get stopped up alot Gastrointestinal Hx of colon cancer s/p surgery 04/24/2015 Genitourinary hx of 3 bladder surgeries with mesh. Patient states surgery was for over sized bladder. Incontience at times with urine. Musculoskeletal Patient states she has degenerative disc disease. Neurologic HX of stroke Oncologic Hx colon cancer Psychiatric Anxiety and depression. Bipolar. Objective Constitutional respirations regular, non-labored and within target range for patient.. Vitals Time Taken: 1:34 PM, Height: 65 in, Weight: 210 lbs, BMI: 34.9, Temperature: 98.2 F, Pulse: 96 bpm, Respiratory Rate: 17 breaths/min, Blood Pressure: 173/81 mmHg. Cardiovascular 2+ dorsalis pedis/posterior tibialis pulses. Psychiatric pleasant and cooperative. General Notes: Left lower extremity: T the knee there is an open wound with undermining. There is granulation tissue but overall does not appear healthy. No o signs of surrounding infection. 2+ pitting edema to the knee. Integumentary (Hair, Skin) Wound #3 status is Open. Original cause of wound was Blister. The date acquired was: 03/28/2022. The wound has been in treatment 2 weeks. The wound is located on the Left,Distal Knee. The wound measures 1cm length x 1.2cm width x 0.3cm depth; 0.942cm^2 area and 0.283cm^3 volume. There is Fat Layer (Subcutaneous Tissue) exposed. There is no tunneling or undermining noted. There is a  medium amount of serosanguineous drainage noted. The wound margin is distinct with the outline attached to the wound base. There is medium (34-66%) pink granulation within the wound bed. There is a medium (34-66%) amount of necrotic tissue within the wound bed including Adherent Slough. The periwound skin appearance exhibited: Induration, Maceration, Erythema. The periwound skin appearance did not exhibit: Callus, Crepitus, Excoriation, Rash, Scarring, Dry/Scaly, Atrophie Blanche, Cyanosis, Ecchymosis, Hemosiderin Staining, Mottled, Pallor, Rubor. The surrounding wound skin color is noted with erythema which is circumferential. Periwound temperature was noted as No Abnormality. The periwound has tenderness on palpation. Assessment Active Problems ICD-10 Acquired absence of right leg below knee Type 2 diabetes mellitus with other skin ulcer Non-pressure chronic ulcer of other part of left lower leg with fat layer exposed Bipolar disorder, unspecified ALANTIS, BETHUNE ([id number]) [id number] Page 6 of 8 Patient's wound is stable. We discussed the importance  of aggressive offloading for her wound healing. She is scheduled to see plastic surgery at the end of the month. She may benefit from a skin substitute or graft. We will try and obtain home health to help with wound care. Continue collagen. Follow-up in 1 month. Plan Follow-up Appointments: Return appointment in 1 month. - w/ Dr. Heber [place] Anesthetic: (In clinic) Topical Lidocaine 5% applied to wound bed Bathing/ Shower/ Hygiene: May shower with protection but do not get wound dressing(s) wet. Edema Control - Lymphedema / SCD / Other: Elevate legs to the level of the heart or above for 30 minutes daily and/or when sitting, a frequency of: Avoid standing for long periods of time. Off-Loading: Other: - Keep pressure off of left knee Home Health: Admit to [facility] for wound care. May utilize formulary  equivalent dressing for wound treatment orders unless otherwise specified. - [facility]- to change dressing 2-3 x a week WOUND #3: - Knee Wound Laterality: Left, Distal Cleanser: Anasept Antimicrobial Skin and Wound Cleanser, 8 (oz) (Generic) 1 x Per Day/15 Days Discharge Instructions: Cleanse the wound with Anasept cleanser prior to applying a clean dressing using gauze sponges, not tissue or cotton balls. Prim Dressing: Promogran Prisma Matrix, 4.34 (sq in) (silver collagen) (Generic) 1 x Per Day/15 Days ary Discharge Instructions: Moisten collagen with saline or hydrogel Secondary Dressing: Zetuvit Plus Silicone Border Dressing 5x5 (in/in) (Generic) 1 x Per Day/15 Days Discharge Instructions: Apply silicone border over primary dressing as directed. 1. Aggressive offloading 2. Collagen 3. Follow-up in 1 month Electronic Signature(s) Signed: 11/03/2022 4:44:16 PM By: Kalman Shan DO Entered By: Kalman Shan on 11/03/2022 16:00:59 -------------------------------------------------------------------------------- HxROS Details Patient Name: Date of Service: [name], [place]. 11/03/2022 1:30 PM Medical Record Number: [id number] Patient Account Number: [id number] Date of Birth/Sex: Treating RN: 1966/08/12 (56 y.o. F) Primary Care Provider: Emelia Loron Other Clinician: Referring Provider: Treating Provider/Extender: Inocente Salles in Treatment: 2 Information Obtained From Patient Constitutional Symptoms (General Health) Medical History: Past Medical History Notes: stroke hypothyroidism Eyes Medical History: Negative for: Cataracts; Glaucoma; Optic Neuritis Ear/Nose/Mouth/Throat Medical History: Negative for: Chronic sinus problems/congestion; Middle ear problems Past Medical History Notes: Patient states her ears get stopped up alot TAELYN, BROECKER ([id number]) [id number] Page 7 of 8 Hematologic/Lymphatic Medical  History: Negative for: Anemia; Hemophilia; Human Immunodeficiency Virus; Lymphedema; Sickle Cell Disease Respiratory Medical History: Positive for: Chronic Obstructive Pulmonary Disease (COPD) - 2 years; Sleep Apnea - sent cpap back didn't lke it. Negative for: Aspiration; Asthma; Pneumothorax; Tuberculosis Cardiovascular Medical History: Positive for: Coronary Artery Disease; Hypertension; Peripheral Arterial Disease Negative for: Angina; Arrhythmia; Congestive Heart Failure; Deep Vein Thrombosis; Hypotension; Myocardial Infarction; Peripheral Venous Disease; Phlebitis; Vasculitis Gastrointestinal Medical History: Negative for: Cirrhosis ; Colitis; Crohns; Hepatitis A; Hepatitis B; Hepatitis C Past Medical History Notes: Hx of colon cancer s/p surgery 04/24/2015 Endocrine Medical History: Positive for: Type II Diabetes - 12/26/1994 Time with diabetes: 30 yrs Treated with: Insulin Blood sugar tested every day: No Genitourinary Medical History: Negative for: End Stage Renal Disease Past Medical History Notes: hx of 3 bladder surgeries with mesh. Patient states surgery was for over sized bladder. Incontience at times with urine. Immunological Medical History: Negative for: Lupus Erythematosus; Raynauds; Scleroderma Integumentary (Skin) Medical History: Negative for: History of Burn Musculoskeletal Medical History: Positive for: Gout; Osteoarthritis Negative for: Rheumatoid Arthritis; Osteomyelitis Past Medical History Notes: Patient states she has degenerative disc disease. Neurologic Medical History: Positive for: Neuropathy Negative for: Dementia; Quadriplegia; Paraplegia; Seizure Disorder Past  Medical History Notes: HX of stroke Oncologic Medical History: Negative for: Received Chemotherapy; Received Radiation Past Medical History Notes: Hx colon cancer Psychiatric Medical History: Positive for: Confinement Anxiety Negative for: Anorexia/bulimia Past Medical  History Notes: Anxiety and depression. Bipolar. EVALENA, FUJII ([id number]) [id number] Page 8 of 8 Immunizations Pneumococcal Vaccine: Received Pneumococcal Vaccination: No Implantable Devices None Hospitalization / Surgery History Type of Hospitalization/Surgery Colon cancer stroke right BKA 2018 Family and Social History Cancer: Yes - Mother,Father; Diabetes: Yes - Mother,Siblings; Heart Disease: Yes - Siblings,Father; Hereditary Spherocytosis: No; Hypertension: Yes - Father,Siblings; Kidney Disease: Yes - Father,Siblings; Lung Disease: Yes - Father; Seizures: No; Stroke: No; Thyroid Problems: No; Tuberculosis: Yes - Paternal Grandparents; Former smoker - quit 1 year ago; Marital Status - Divorced; Alcohol Use: Never; Drug Use: No History; Caffeine Use: Daily; Financial Concerns: No; Food, Clothing or Shelter Needs: No; Support System Lacking: No; Transportation Concerns: No Electronic Signature(s) Signed: 11/03/2022 4:44:16 PM By: Kalman Shan DO Entered By: Kalman Shan on 11/03/2022 15:56:32 -------------------------------------------------------------------------------- SuperBill Details Patient Name: Date of Service: [name], [place]. 11/03/2022 Medical Record Number: [id number] Patient Account Number: [id number] Date of Birth/Sex: Treating RN: March 06, 1966 (56 y.o. [name], [name] Primary Care Provider: Emelia Loron Other Clinician: Referring Provider: Treating Provider/Extender: Inocente Salles in Treatment: 2 Diagnosis Coding ICD-10 Codes Code Description [phone] Acquired absence of right leg below knee E11.622 Type 2 diabetes mellitus with other skin ulcer F31.9 Bipolar disorder, unspecified L97.822 Non-pressure chronic ulcer of other part of left lower leg with fat layer exposed Facility Procedures : CPT4 Code: [id number] Description: [facility] VISIT-LEV 3 EST PT Modifier: Quantity: 1 Physician  Procedures : CPT4 Code Description Modifier [id number] - WC PHYS LEVEL 3 - EST PT ICD-10 Diagnosis Description L97.822 Non-pressure chronic ulcer of other part of left lower leg with fat layer exposed E11.622 Type 2 diabetes mellitus with other skin ulcer  Z89.511 Acquired absence of right leg below knee F31.9 Bipolar disorder, unspecified Quantity: 1 Electronic Signature(s) Signed: 11/03/2022 4:44:16 PM By: Kalman Shan DO Entered By: Kalman Shan on 11/03/2022 16:01:27

## 2022-11-07 ENCOUNTER — Telehealth: Payer: Self-pay | Admitting: Orthopedic Surgery

## 2022-11-07 NOTE — Telephone Encounter (Signed)
Patient called in stating she needs a note stating the results of her testing stating she has bone on bone in the knee, she has a Doctor appt tomorrow and needs it before 11 AM, please advise

## 2022-11-07 NOTE — Telephone Encounter (Signed)
Per notes on 03/23/20 xray findings on 01/19/22 that pt has advanced osteoarthritic findings w/ spurring. Will put info in a letter form for her.

## 2022-11-07 NOTE — Progress Notes (Addendum)
BURNA, ATLAS ([id number]) [id number] Page 1 of 8 Visit Report for 11/03/2022 Arrival Information Details Patient Name: Date of Service: [name] Elk. 11/03/2022 1:30 PM Medical Record Number: [id number] Patient Account Number: [id number] Date of Birth/Sex: Treating RN: June 15, 1966 (56 y.o. [name], [name] Primary Care Verginia Toohey: Emelia Loron Other Clinician: Referring Jaleya Pebley: Treating Florita Nitsch/Extender: Inocente Salles in Treatment: 2 Visit Information History Since Last Visit Added or deleted any medications: No Patient Arrived: Ambulatory Any new allergies or adverse reactions: No Arrival Time: 13:33 Had a fall or experienced change in No Accompanied By: mother activities of daily living that may affect Transfer Assistance: None risk of falls: Patient Identification Verified: Yes Signs or symptoms of abuse/neglect since last visito No Secondary Verification Process Completed: Yes Hospitalized since last visit: No Patient Requires Transmission-Based Precautions: No Implantable device outside of the clinic excluding No Patient Has Alerts: No cellular tissue based products placed in the center since last visit: Has Dressing in Place as Prescribed: Yes Pain Present Now: Yes Electronic Signature(s) Signed: 11/07/2022 4:10:29 PM By: Rhae Hammock RN Entered By: Rhae Hammock on 11/03/2022 13:34:18 -------------------------------------------------------------------------------- Clinic Level of Care Assessment Details Patient Name: Date of Service: [name] NNIE M. 11/03/2022 1:30 PM Medical Record Number: [id number] Patient Account Number: [id number] Date of Birth/Sex: Treating RN: 1966/08/28 (56 y.o. [name], [name] Primary Care Cadyn Rodger: Emelia Loron Other Clinician: Referring Collins Dimaria: Treating Jannah Guardiola/Extender: Inocente Salles in Treatment: 2 Clinic Level of Care Assessment  Items TOOL 4 Quantity Score X- 1 0 Use when only an EandM is performed on FOLLOW-UP visit ASSESSMENTS - Nursing Assessment / Reassessment X- 1 10 Reassessment of Co-morbidities (includes updates in patient status) X- 1 5 Reassessment of Adherence to Treatment Plan ASSESSMENTS - Wound and Skin A ssessment / Reassessment X - Simple Wound Assessment / Reassessment - one wound 1 5 '[]'$  - 0 Complex Wound Assessment / Reassessment - multiple wounds '[]'$  - 0 Dermatologic / Skin Assessment (not related to wound area) ASSESSMENTS - Focused Assessment '[]'$  - 0 Circumferential Edema Measurements - multi extremities '[]'$  - 0 Nutritional Assessment / Counseling / Intervention EYLA, TALLON ([id number]) [id number] Page 2 of 8 '[]'$  - 0 Lower Extremity Assessment (monofilament, tuning fork, pulses) '[]'$  - 0 Peripheral Arterial Disease Assessment (using hand held doppler) ASSESSMENTS - Ostomy and/or Continence Assessment and Care '[]'$  - 0 Incontinence Assessment and Management '[]'$  - 0 Ostomy Care Assessment and Management (repouching, etc.) PROCESS - Coordination of Care '[]'$  - 0 Simple Patient / Family Education for ongoing care X- 1 20 Complex (extensive) Patient / Family Education for ongoing care X- 1 10 Staff obtains Programmer, systems, Records, T Results / Process Orders est '[]'$  - 0 Staff telephones HHA, Nursing Homes / Clarify orders / etc '[]'$  - 0 Routine Transfer to another Facility (non-emergent condition) '[]'$  - 0 Routine Hospital Admission (non-emergent condition) '[]'$  - 0 New Admissions / Biomedical engineer / Ordering NPWT Apligraf, etc. , '[]'$  - 0 Emergency Hospital Admission (emergent condition) '[]'$  - 0 Simple Discharge Coordination X- 1 15 Complex (extensive) Discharge Coordination PROCESS - Special Needs '[]'$  - 0 Pediatric / Minor Patient Management '[]'$  - 0 Isolation Patient Management '[]'$  - 0 Hearing / Language / Visual special needs '[]'$  - 0 Assessment of  Community assistance (transportation, D/C planning, etc.) '[]'$  - 0 Additional assistance / Altered mentation '[]'$  - 0 Support Surface(s) Assessment (bed, cushion, seat, etc.) INTERVENTIONS - Wound Cleansing / Measurement X - Simple Wound Cleansing -  one wound 1 5 '[]'$  - 0 Complex Wound Cleansing - multiple wounds X- 1 5 Wound Imaging (photographs - any number of wounds) '[]'$  - 0 Wound Tracing (instead of photographs) X- 1 5 Simple Wound Measurement - one wound '[]'$  - 0 Complex Wound Measurement - multiple wounds INTERVENTIONS - Wound Dressings X - Small Wound Dressing one or multiple wounds 1 10 '[]'$  - 0 Medium Wound Dressing one or multiple wounds '[]'$  - 0 Large Wound Dressing one or multiple wounds X- 1 5 Application of Medications - topical '[]'$  - 0 Application of Medications - injection INTERVENTIONS - Miscellaneous '[]'$  - 0 External ear exam '[]'$  - 0 Specimen Collection (cultures, biopsies, blood, body fluids, etc.) '[]'$  - 0 Specimen(s) / Culture(s) sent or taken to Lab for analysis '[]'$  - 0 Patient Transfer (multiple staff / Civil Service fast streamer / Similar devices) '[]'$  - 0 Simple Staple / Suture removal (25 or less) '[]'$  - 0 Complex Staple / Suture removal (26 or more) '[]'$  - 0 Hypo / Hyperglycemic Management (close monitor of Blood Glucose) SHALAINA, GUARDIOLA ([id number]) [id number] Page 3 of 8 '[]'$  - 0 Ankle / Brachial Index (ABI) - do not check if billed separately X- 1 5 Vital Signs Has the patient been seen at the hospital within the last three years: Yes Total Score: 100 Level Of Care: New/Established - Level 3 Electronic Signature(s) Signed: 11/07/2022 4:10:29 PM By: Rhae Hammock RN Entered By: Rhae Hammock on 11/03/2022 14:12:41 -------------------------------------------------------------------------------- Encounter Discharge Information Details Patient Name: Date of Service: [name], [place]. 11/03/2022 1:30 PM Medical Record Number: [id number] Patient  Account Number: [id number] Date of Birth/Sex: Treating RN: 1966/07/16 (56 y.o. [name], [name] Primary Care Lanett Lasorsa: Emelia Loron Other Clinician: Referring Leviathan Macera: Treating Abrish Erny/Extender: Inocente Salles in Treatment: 2 Encounter Discharge Information Items Discharge Condition: Stable Ambulatory Status: Ambulatory Discharge Destination: Home Transportation: Private Auto Accompanied By: family Schedule Follow-up Appointment: Yes Clinical Summary of Care: Patient Declined Electronic Signature(s) Signed: 11/07/2022 4:10:29 PM By: Rhae Hammock RN Entered By: Rhae Hammock on 11/03/2022 14:13:20 -------------------------------------------------------------------------------- Lower Extremity Assessment Details Patient Name: Date of Service: [name], [place]. 11/03/2022 1:30 PM Medical Record Number: [id number] Patient Account Number: [id number] Date of Birth/Sex: Treating RN: 1966/04/30 (56 y.o. [name], [name] Primary Care Latoyia Tecson: Emelia Loron Other Clinician: Referring Kymani Laursen: Treating Marijke Guadiana/Extender: Inocente Salles in Treatment: 2 Electronic Signature(s) Signed: 11/07/2022 4:10:29 PM By: Rhae Hammock RN Entered By: Rhae Hammock on 11/03/2022 13:35:28 -------------------------------------------------------------------------------- Multi Wound Chart Details Patient Name: Date of Service: [name], [place]. 11/03/2022 1:30 PM Medical Record Number: [id number] Patient Account Number: [id number] [name], [name] ([id number]) [id number] Page 4 of 8 Date of Birth/Sex: Treating RN: [birth year] (56 y.o. F) Primary Care Nikiesha Milford: Other Clinician: Emelia Loron Referring Gurbani Figge: Treating Daniyal Tabor/Extender: Inocente Salles in Treatment: 2 Vital Signs Height(in): 73 Pulse(bpm): 38 Weight(lbs): 210 Blood Pressure(mmHg): 173/81 Body Mass Index(BMI):  34.9 Temperature(F): 98.2 Respiratory Rate(breaths/min): 17 [3:Photos:] [N/A:N/A] Left, Distal Knee N/A N/A Wound Location: Blister N/A N/A Wounding Event: Diabetic Wound/Ulcer of the Lower N/A N/A Primary Etiology: Extremity Abscess N/A N/A Secondary Etiology: Chronic Obstructive Pulmonary N/A N/A Comorbid History: Disease (COPD), Sleep Apnea, Coronary Artery Disease, Hypertension, Peripheral Arterial Disease, Type II Diabetes, Gout, Osteoarthritis, Neuropathy, Confinement Anxiety 03/28/2022 N/A N/A Date Acquired: 2 N/A N/A Weeks of Treatment: Open N/A N/A Wound Status: No N/A N/A Wound Recurrence: 1x1.2x0.3 N/A N/A Measurements L x W x D (cm) 0.942 N/A N/A  A (cm) : rea 0.283 N/A N/A Volume (cm) : 14.40% N/A N/A % Reduction in A rea: 14.20% N/A N/A % Reduction in Volume: Grade 2 N/A N/A Classification: Medium N/A N/A Exudate A mount: Serosanguineous N/A N/A Exudate Type: red, brown N/A N/A Exudate Color: Distinct, outline attached N/A N/A Wound Margin: Medium (34-66%) N/A N/A Granulation A mount: Pink N/A N/A Granulation Quality: Medium (34-66%) N/A N/A Necrotic A mount: Fat Layer (Subcutaneous Tissue): Yes N/A N/A Exposed Structures: Fascia: No Tendon: No Muscle: No Joint: No Bone: No None N/A N/A Epithelialization: Induration: Yes N/A N/A Periwound Skin Texture: Excoriation: No Callus: No Crepitus: No Rash: No Scarring: No Maceration: Yes N/A N/A Periwound Skin Moisture: Dry/Scaly: No Erythema: Yes N/A N/A Periwound Skin Color: Atrophie Blanche: No Cyanosis: No Ecchymosis: No Hemosiderin Staining: No Mottled: No Pallor: No Rubor: No Circumferential N/A N/A Erythema Location: No Abnormality N/A N/A Temperature: Yes N/A N/A Tenderness on Palpation: Treatment Notes Wound #3 (Knee) Wound Laterality: Left, Distal AIBHLINN, KALMAR ([id number]) [id number] Page 5 of 8 Cleanser Anasept Antimicrobial  Skin and Wound Cleanser, 8 (oz) Discharge Instruction: Cleanse the wound with Anasept cleanser prior to applying a clean dressing using gauze sponges, not tissue or cotton balls. Peri-Wound Care Topical Primary Dressing Promogran Prisma Matrix, 4.34 (sq in) (silver collagen) Discharge Instruction: Moisten collagen with saline or hydrogel Secondary Dressing Zetuvit Plus Silicone Border Dressing 5x5 (in/in) Discharge Instruction: Apply silicone border over primary dressing as directed. Secured With Compression Wrap Compression Stockings Add-Ons Electronic Signature(s) Signed: 11/03/2022 4:44:16 PM By: Kalman Shan DO Entered By: Kalman Shan on 11/03/2022 15:51:59 -------------------------------------------------------------------------------- Multi-Disciplinary Care Plan Details Patient Name: Date of Service: [name], [place]. 11/03/2022 1:30 PM Medical Record Number: [id number] Patient Account Number: [id number] Date of Birth/Sex: Treating RN: 1966-10-13 (56 y.o. [name], [name] Primary Care Seward Coran: Emelia Loron Other Clinician: Referring Porscha Axley: Treating Jocob Dambach/Extender: Inocente Salles in Treatment: 2 Active Inactive Electronic Signature(s) Signed: 12/01/2022 4:47:27 PM By: Deon Pilling RN, BSN Signed: 12/28/2022 5:36:15 PM By: Rhae Hammock RN Previous Signature: 11/07/2022 4:10:29 PM Version By: Rhae Hammock RN Entered By: Deon Pilling on 12/01/2022 16:47:27 -------------------------------------------------------------------------------- Pain Assessment Details Patient Name: Date of Service: [name], [name]. 11/03/2022 1:30 PM Medical Record Number: [id number] Patient Account Number: [id number] Date of Birth/Sex: Treating RN: 1966/11/29 (56 y.o. [name], [name] Primary Care Kealani Leckey: Emelia Loron Other Clinician: Referring Beulah Matusek: Treating Ivey Nembhard/Extender: Inocente Salles in Treatment: 2 Active  Problems DORIE, OHMS ([id number]) [id number] Page 6 of 8 Location of Pain Severity and Description of Pain Patient Has Paino Yes Site Locations Pain Location: Generalized Pain, Pain in Ulcers With Dressing Change: Yes Duration of the Pain. Constant / Intermittento Intermittent Rate the pain. Current Pain Level: 7 Worst Pain Level: 10 Least Pain Level: 0 Tolerable Pain Level: 7 Character of Pain Describe the Pain: Aching Pain Management and Medication Current Pain Management: Medication: No Cold Application: No Rest: No Massage: No Activity: No T.E.N.S.: No Heat Application: No Leg drop or elevation: No Is the Current Pain Management Adequate: Adequate How does your wound impact your activities of daily livingo Sleep: No Bathing: No Appetite: No Relationship With Others: No Bladder Continence: No Emotions: No Bowel Continence: No Work: No Toileting: No Drive: No Dressing: No Hobbies: No Electronic Signature(s) Signed: 11/07/2022 4:10:29 PM By: Rhae Hammock RN Entered By: Rhae Hammock on 11/03/2022 13:35:05 -------------------------------------------------------------------------------- Patient/Caregiver Education Details Patient Name: Date of Service: [name], [place] M. 11/9/2023andnbsp1:30  PM Medical Record Number: [id number] Patient Account Number: [id number] Date of Birth/Gender: Treating RN: 05-Feb-1966 (56 y.o. [name], [name] Primary Care Physician: Emelia Loron Other Clinician: Referring Physician: Treating Physician/Extender: Inocente Salles in Treatment: 2 Education Assessment Education Provided To: Patient Education Topics Provided [place]: o Methods: Explain/Verbal Responses: State content correctly [name], [name] ([id number]) [id number] Page 7 of 8 Electronic Signature(s) Signed: 11/07/2022 4:10:29 PM By: Rhae Hammock  RN Entered By: Rhae Hammock on 11/03/2022 14:11:38 -------------------------------------------------------------------------------- Wound Assessment Details Patient Name: Date of Service: [name], [place]. 11/03/2022 1:30 PM Medical Record Number: [id number] Patient Account Number: [id number] Date of Birth/Sex: Treating RN: Feb 17, 1966 (56 y.o. F) Primary Care [name]: Emelia Loron Other Clinician: Referring [name]: Treating [name]/Extender: Inocente Salles in Treatment: 2 Wound Status Wound Number: 3 Primary Diabetic Wound/Ulcer of the Lower Extremity Etiology: Wound Location: Left, Distal Knee Secondary Abscess Wounding Event: Blister Etiology: Date Acquired: 03/28/2022 Wound Open Weeks Of Treatment: 2 Status: Clustered Wound: No Comorbid Chronic Obstructive Pulmonary Disease (COPD), Sleep Apnea, History: Coronary Artery Disease, Hypertension, Peripheral Arterial Disease, Type II Diabetes, Gout, Osteoarthritis, Neuropathy, Confinement Anxiety Photos Wound Measurements Length: (cm) 1 Width: (cm) 1.2 Depth: (cm) 0.3 Area: (cm) 0.942 Volume: (cm) 0.283 % Reduction in Area: 14.4% % Reduction in Volume: 14.2% Epithelialization: None Tunneling: No Undermining: No Wound Description Classification: Grade 2 Wound Margin: Distinct, outline attached Exudate Amount: Medium Exudate Type: Serosanguineous Exudate Color: red, brown Foul Odor After Cleansing: No Slough/Fibrino Yes Wound Bed Granulation Amount: Medium (34-66%) Exposed Structure Granulation Quality: Pink Fascia Exposed: No Necrotic Amount: Medium (34-66%) Fat Layer (Subcutaneous Tissue) Exposed: Yes Necrotic Quality: Adherent Slough Tendon Exposed: No Muscle Exposed: No Joint Exposed: No Bone Exposed: No Periwound Skin Texture Texture Color No Abnormalities Noted: No No Abnormalities Noted: No Callus: No [address]MABELL, ESGUERRA ([id number])  [id number] Page 8 of 8 Crepitus: No Cyanosis: No Excoriation: No Ecchymosis: No Induration: Yes Erythema: Yes Rash: No Erythema Location: Circumferential Scarring: No Hemosiderin Staining: No Mottled: No Moisture Pallor: No No Abnormalities Noted: No Rubor: No Dry / Scaly: No Maceration: Yes Temperature / Pain Temperature: No Abnormality Tenderness on Palpation: Yes Electronic Signature(s) Signed: 11/03/2022 3:31:30 PM By: [name] Entered By: [name] on 11/03/2022 13:40:21 -------------------------------------------------------------------------------- Vitals Details Patient Name: Date of Service: [name], [place]. 11/03/2022 1:30 PM Medical Record Number: [id number] Patient Account Number: [id number] Date of Birth/Sex: Treating RN: 01-Jul-1966 (56 y.o. [name], [name] Primary Care Alyxandria Wentz: Emelia Loron Other Clinician: Referring Hensley Aziz: Treating Broedy Osbourne/Extender: Inocente Salles in Treatment: 2 Vital Signs Time Taken: 13:34 Temperature (F): 98.2 Height (in): 65 Pulse (bpm): 96 Weight (lbs): 210 Respiratory Rate (breaths/min): 17 Body Mass Index (BMI): 34.9 Blood Pressure (mmHg): 173/81 Reference Range: 80 - 120 mg / dl Electronic Signature(s) Signed: 11/07/2022 4:10:29 PM By: Rhae Hammock RN Entered By: Rhae Hammock on 11/03/2022 13:34:38

## 2022-11-07 NOTE — Telephone Encounter (Signed)
See previous Telephone note.

## 2022-11-08 ENCOUNTER — Other Ambulatory Visit (HOSPITAL_COMMUNITY): Payer: Self-pay

## 2022-11-08 MED ORDER — FLUCONAZOLE 200 MG PO TABS
200.0000 mg | ORAL_TABLET | Freq: Once | ORAL | 1 refills | Status: AC
  Filled 2022-11-08: qty 1, 1d supply, fill #0

## 2022-11-08 MED ORDER — OXYCODONE-ACETAMINOPHEN 5-325 MG PO TABS
1.0000 | ORAL_TABLET | Freq: Four times a day (QID) | ORAL | 0 refills | Status: DC | PRN
  Filled 2022-11-08: qty 120, 30d supply, fill #0

## 2022-11-08 NOTE — Telephone Encounter (Signed)
Pt informed to go to check in on left side of office to pick up letter.

## 2022-11-09 ENCOUNTER — Other Ambulatory Visit (HOSPITAL_COMMUNITY): Payer: Self-pay

## 2022-11-09 MED ORDER — POTASSIUM CHLORIDE CRYS ER 20 MEQ PO TBCR
20.0000 meq | EXTENDED_RELEASE_TABLET | Freq: Two times a day (BID) | ORAL | 3 refills | Status: AC
  Filled 2022-11-09: qty 180, 90d supply, fill #0

## 2022-11-11 ENCOUNTER — Other Ambulatory Visit (HOSPITAL_COMMUNITY): Payer: Self-pay

## 2022-11-29 ENCOUNTER — Other Ambulatory Visit (HOSPITAL_COMMUNITY): Payer: Self-pay

## 2022-11-29 MED ORDER — OXYCODONE-ACETAMINOPHEN 5-325 MG PO TABS
1.0000 | ORAL_TABLET | Freq: Four times a day (QID) | ORAL | 0 refills | Status: AC | PRN
  Filled 2022-11-29 – 2022-12-05 (×2): qty 40, 10d supply, fill #0

## 2022-12-05 ENCOUNTER — Other Ambulatory Visit (HOSPITAL_COMMUNITY): Payer: Self-pay

## 2022-12-14 ENCOUNTER — Ambulatory Visit: Payer: 59 | Admitting: Endocrinology

## 2022-12-23 ENCOUNTER — Telehealth: Payer: Self-pay | Admitting: Cardiovascular Disease

## 2022-12-23 NOTE — Telephone Encounter (Signed)
Leanne from [facility] has called stating this pt has passed away 2022/12/23 and wants to know if Dr. Acie Fredrickson can sign death certificate.   I have sent an email to HIM as well so she can be marked deceased.

## 2022-12-26 DEATH — deceased

## 2022-12-27 ENCOUNTER — Other Ambulatory Visit: Payer: Self-pay | Admitting: Endocrinology

## 2022-12-27 DIAGNOSIS — E1165 Type 2 diabetes mellitus with hyperglycemia: Secondary | ICD-10-CM

## 2022-12-27 NOTE — Telephone Encounter (Signed)
Routed to Emelia Loron, NP with explanation and contact info for [facility] [employer].
